# Patient Record
Sex: Female | Born: 1950 | Race: Black or African American | Hispanic: No | Marital: Married | Smoking: Never smoker
Health system: Southern US, Community
[De-identification: ages and names within clinical notes are randomized; demographics above are authoritative.]

## PROBLEM LIST (undated history)

## (undated) DIAGNOSIS — E042 Nontoxic multinodular goiter: Secondary | ICD-10-CM

## (undated) DIAGNOSIS — K5792 Diverticulitis of intestine, part unspecified, without perforation or abscess without bleeding: Secondary | ICD-10-CM

## (undated) DIAGNOSIS — I10 Essential (primary) hypertension: Secondary | ICD-10-CM

## (undated) DIAGNOSIS — K56609 Unspecified intestinal obstruction, unspecified as to partial versus complete obstruction: Secondary | ICD-10-CM

## (undated) DIAGNOSIS — E785 Hyperlipidemia, unspecified: Secondary | ICD-10-CM

## (undated) DIAGNOSIS — D649 Anemia, unspecified: Secondary | ICD-10-CM

## (undated) DIAGNOSIS — M1990 Unspecified osteoarthritis, unspecified site: Secondary | ICD-10-CM

## (undated) DIAGNOSIS — E119 Type 2 diabetes mellitus without complications: Secondary | ICD-10-CM

## (undated) DIAGNOSIS — R413 Other amnesia: Secondary | ICD-10-CM

## (undated) DIAGNOSIS — E041 Nontoxic single thyroid nodule: Secondary | ICD-10-CM

## (undated) HISTORY — DX: Other amnesia: R41.3

## (undated) HISTORY — PX: SMALL INTESTINE SURGERY: SHX150

## (undated) HISTORY — DX: Unspecified intestinal obstruction, unspecified as to partial versus complete obstruction: K56.609

## (undated) HISTORY — DX: Hyperlipidemia, unspecified: E78.5

## (undated) HISTORY — PX: ABDOMINAL HYSTERECTOMY: SHX81

## (undated) HISTORY — PX: HEMICOLECTOMY: SHX854

## (undated) HISTORY — PX: CHOLECYSTECTOMY: SHX55

## (undated) HISTORY — PX: TONSILLECTOMY: SUR1361

## (undated) HISTORY — PX: LAPAROSCOPIC REMOVAL ABDOMINAL MASS: SHX6360

## (undated) HISTORY — PX: LUMBAR DISC SURGERY: SHX700

## (undated) HISTORY — PX: EYE SURGERY: SHX253

## (undated) HISTORY — PX: HAND SURGERY: SHX662

## (undated) HISTORY — PX: FOOT SURGERY: SHX648

---
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
## 1993-12-17 DIAGNOSIS — I219 Acute myocardial infarction, unspecified: Secondary | ICD-10-CM

## 1993-12-17 HISTORY — DX: Acute myocardial infarction, unspecified: I21.9

## 1998-12-17 HISTORY — PX: CARDIAC CATHETERIZATION: SHX172

## 1999-03-15 ENCOUNTER — Inpatient Hospital Stay (HOSPITAL_COMMUNITY): Admission: EM | Admit: 1999-03-15 | Discharge: 1999-03-17 | Payer: Self-pay | Admitting: Emergency Medicine

## 1999-03-15 ENCOUNTER — Encounter: Payer: Self-pay | Admitting: Emergency Medicine

## 2001-08-06 ENCOUNTER — Encounter: Payer: Self-pay | Admitting: Neurosurgery

## 2001-08-11 ENCOUNTER — Ambulatory Visit (HOSPITAL_COMMUNITY): Admission: RE | Admit: 2001-08-11 | Discharge: 2001-08-12 | Payer: Self-pay | Admitting: Neurosurgery

## 2001-08-11 ENCOUNTER — Encounter: Payer: Self-pay | Admitting: Neurosurgery

## 2002-02-13 ENCOUNTER — Encounter: Payer: Self-pay | Admitting: Neurosurgery

## 2002-02-17 ENCOUNTER — Encounter: Payer: Self-pay | Admitting: Neurosurgery

## 2002-02-17 ENCOUNTER — Inpatient Hospital Stay (HOSPITAL_COMMUNITY): Admission: RE | Admit: 2002-02-17 | Discharge: 2002-03-02 | Payer: Self-pay | Admitting: Neurosurgery

## 2002-03-20 ENCOUNTER — Emergency Department (HOSPITAL_COMMUNITY): Admission: EM | Admit: 2002-03-20 | Discharge: 2002-03-20 | Payer: Self-pay | Admitting: Emergency Medicine

## 2004-03-07 ENCOUNTER — Encounter: Admission: RE | Admit: 2004-03-07 | Discharge: 2004-03-07 | Payer: Self-pay | Admitting: Obstetrics and Gynecology

## 2004-03-23 ENCOUNTER — Encounter: Admission: RE | Admit: 2004-03-23 | Discharge: 2004-03-28 | Payer: Self-pay | Admitting: Neurology

## 2004-03-23 ENCOUNTER — Encounter (INDEPENDENT_AMBULATORY_CARE_PROVIDER_SITE_OTHER): Payer: Self-pay | Admitting: Cardiology

## 2004-03-23 ENCOUNTER — Inpatient Hospital Stay (HOSPITAL_COMMUNITY): Admission: EM | Admit: 2004-03-23 | Discharge: 2004-03-28 | Payer: Self-pay | Admitting: Emergency Medicine

## 2004-03-28 ENCOUNTER — Inpatient Hospital Stay (HOSPITAL_COMMUNITY)
Admission: RE | Admit: 2004-03-28 | Discharge: 2004-03-30 | Payer: Self-pay | Admitting: Physical Medicine & Rehabilitation

## 2005-03-07 ENCOUNTER — Emergency Department: Payer: Self-pay | Admitting: Emergency Medicine

## 2005-04-07 ENCOUNTER — Emergency Department: Payer: Self-pay | Admitting: Unknown Physician Specialty

## 2005-04-16 ENCOUNTER — Ambulatory Visit: Payer: Self-pay | Admitting: Family Medicine

## 2005-05-14 ENCOUNTER — Encounter: Payer: Self-pay | Admitting: Orthopedic Surgery

## 2005-05-17 ENCOUNTER — Encounter: Payer: Self-pay | Admitting: Orthopedic Surgery

## 2005-06-16 ENCOUNTER — Encounter: Payer: Self-pay | Admitting: Orthopedic Surgery

## 2005-11-14 ENCOUNTER — Other Ambulatory Visit: Payer: Self-pay

## 2005-11-14 ENCOUNTER — Observation Stay: Payer: Self-pay

## 2006-03-29 IMAGING — CT CT HEAD W/O CM
1 of 2 series · 13 of 30 positions shown, 17 images · non-contrast
Comparison: 03/23/04.

CLINICAL DATA: Stroke.  
 COMPUTERIZED CRANIAL TOMOGRAPHY PERFORMED WITHOUT CONTRAST 03/24/04

[Series 2: brain · axial · 0.47mm/px · z∈[-17,+91]mm · 13 of 32 slices shown, 17 images]
[im 3/32  brain]
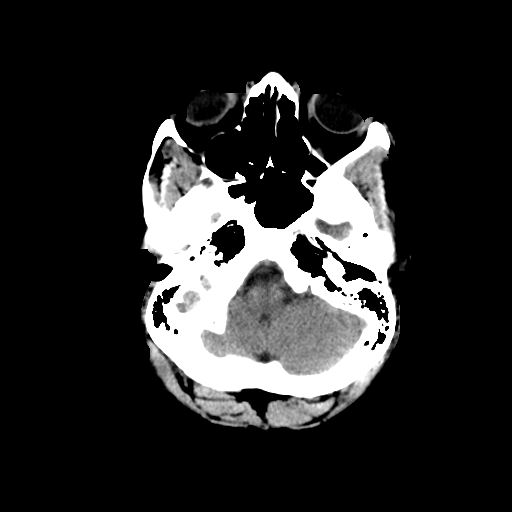
[im 3/32  bone]
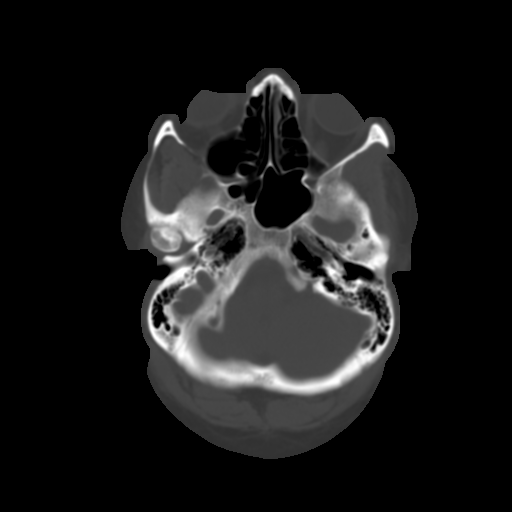
[im 5/32  brain]
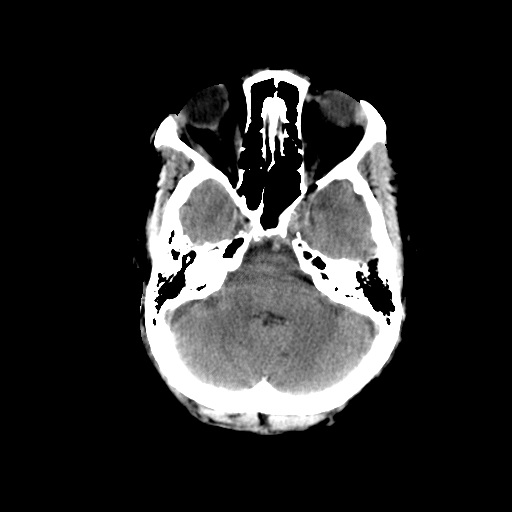
[im 7/32  brain]
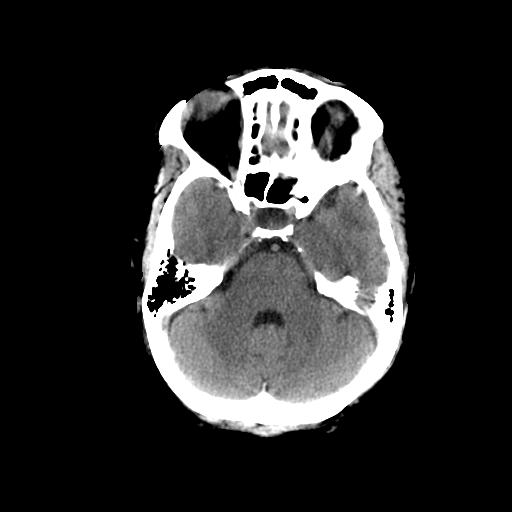
[im 9/32  brain]
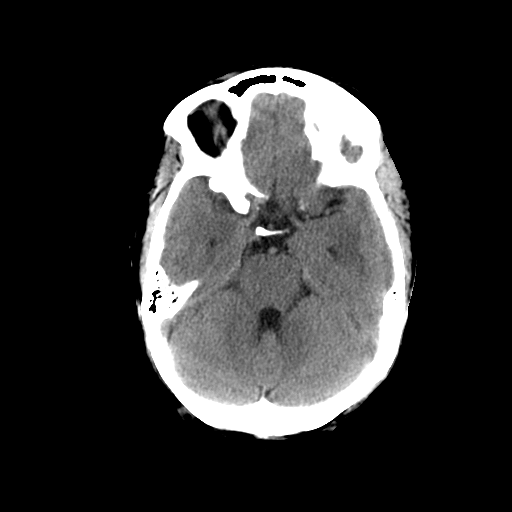
[im 12/32  brain]
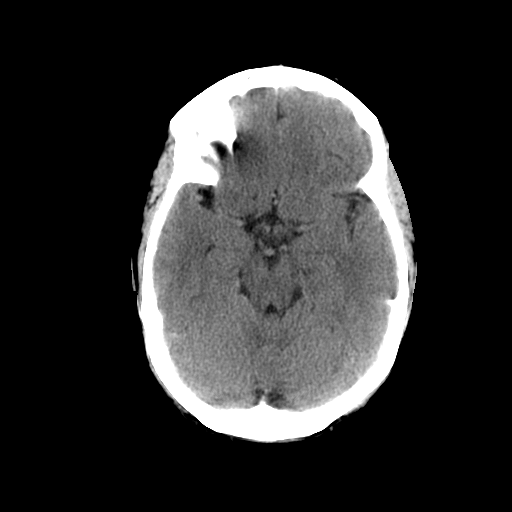
[im 12/32  bone]
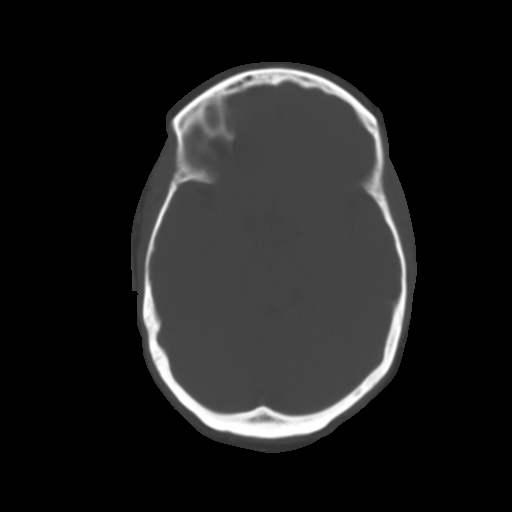
[im 14/32  brain]
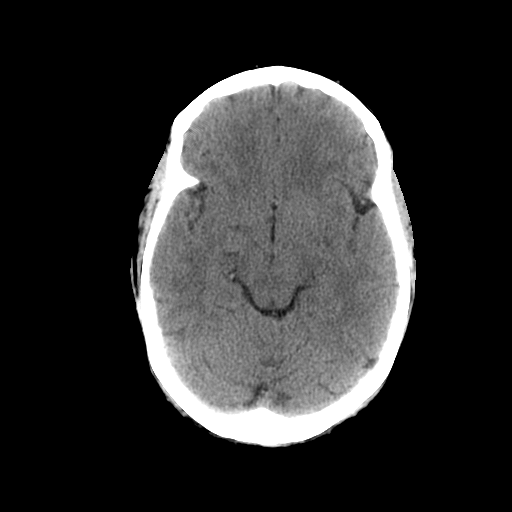
[im 16/32  brain]
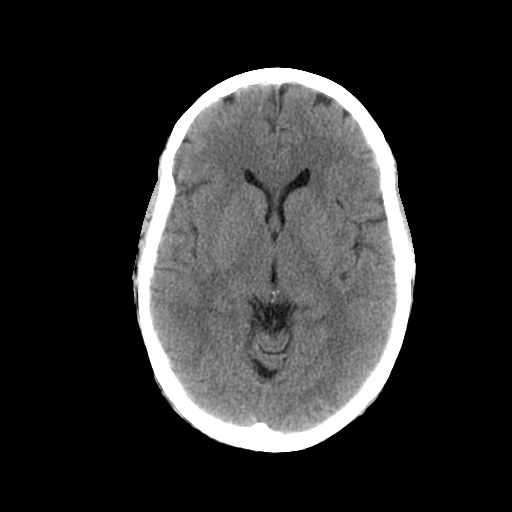
[im 18/32  brain]
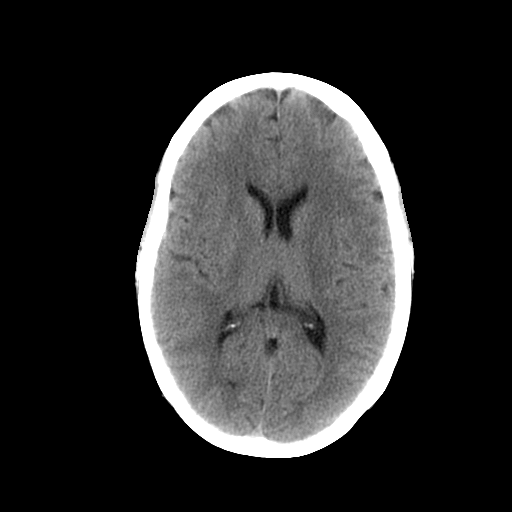
[im 20/32  brain]
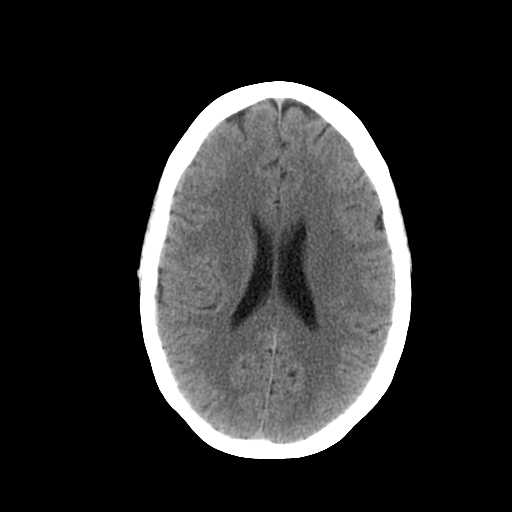
[im 20/32  bone]
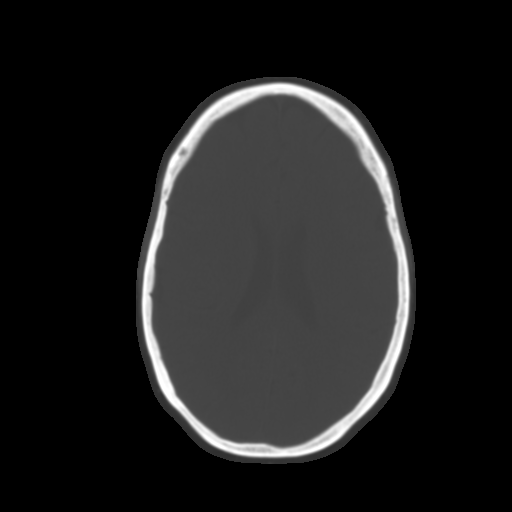
[im 23/32  brain]
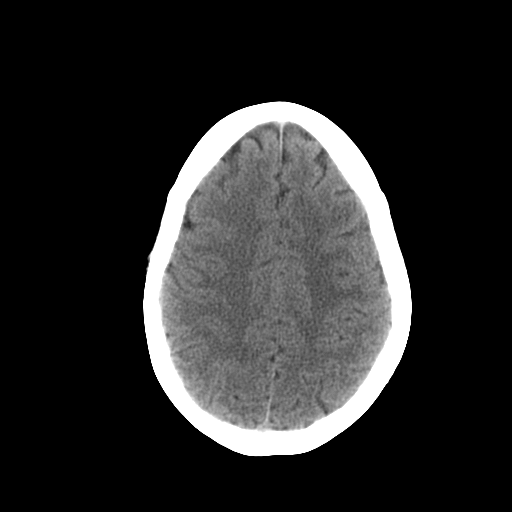
[im 25/32  brain]
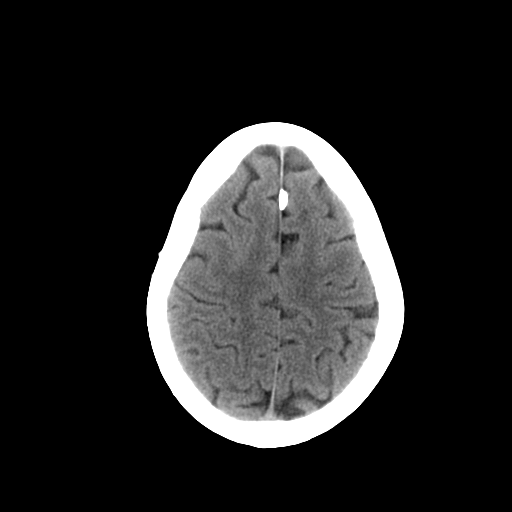
[im 27/32  brain]
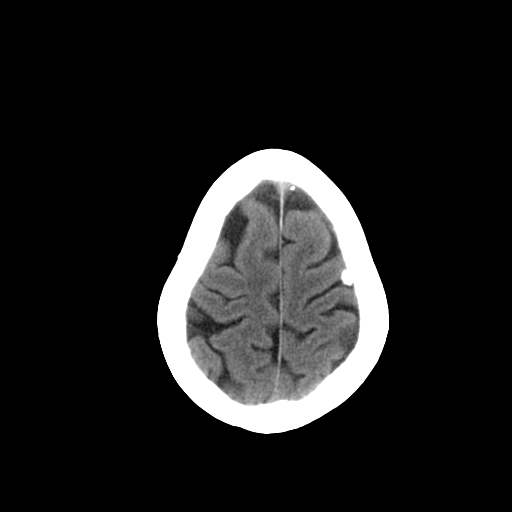
[im 29/32  brain]
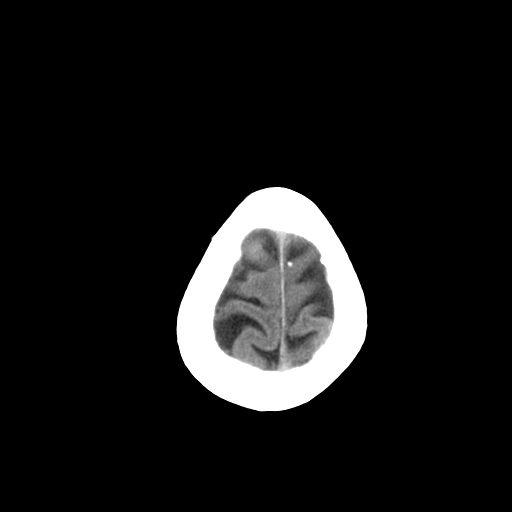
[im 29/32  bone]
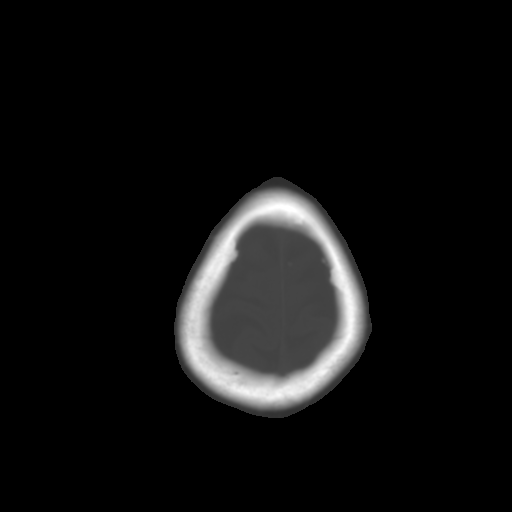

[13 of 30 positions shown; findings below may reference images not displayed]

There is no hemorrhage, infarction, mass lesion, or other significant abnormality.  There is a tiny amount of debris or mucosal thickening in the posterior aspect of the right maxillary sinus, unchanged.  
 IMPRESSION
 No significant abnormality.

## 2006-03-30 IMAGING — CT CT HEAD W/O CM
1 of 2 series · 13 of 30 positions shown, 17 images · non-contrast
Comparison: none

CLINICAL DATA: CVA. 
 CT OF THE HEAD WITHOUT CONTRAST 03/25/04
 Comparing 03/24/04.

[Series 2: brain · axial · 0.47mm/px · z∈[+125,+246]mm · 13 of 28 slices shown, 17 images]
[im 2/28  brain]
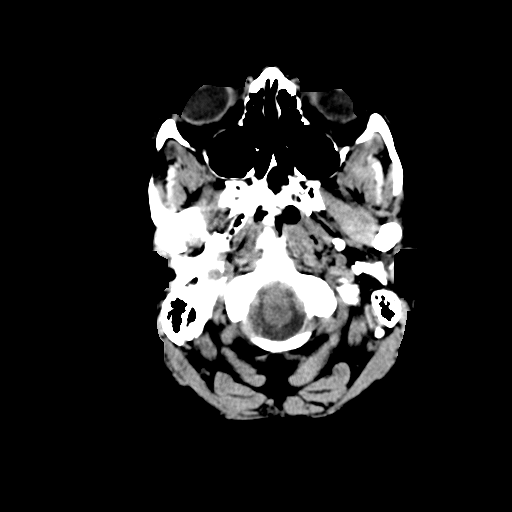
[im 2/28  bone]
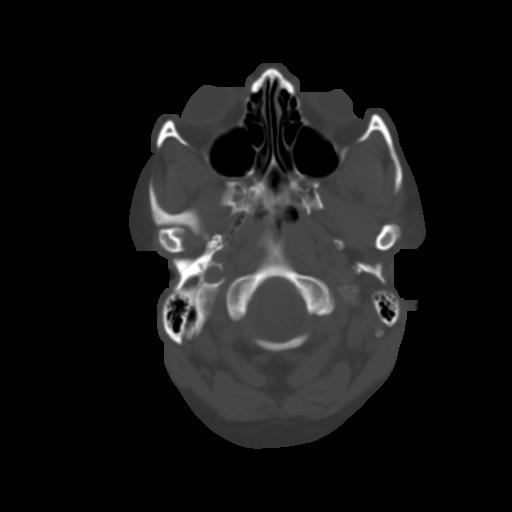
[im 4/28  brain]
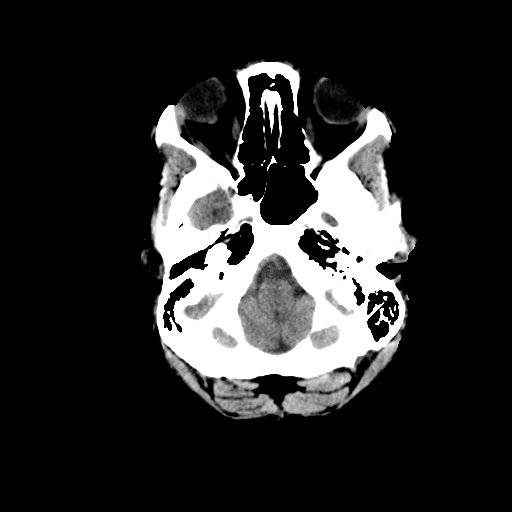
[im 6/28  brain]
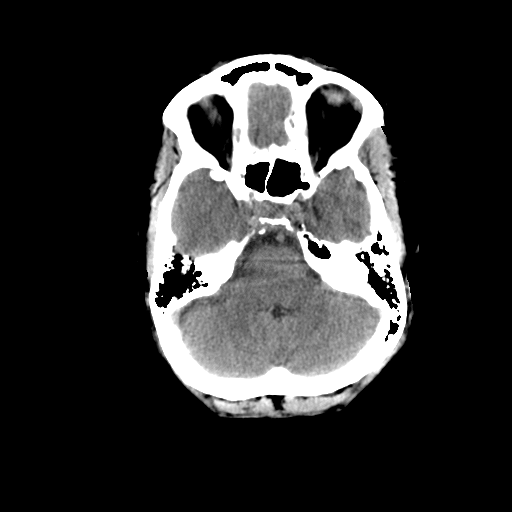
[im 8/28  brain]
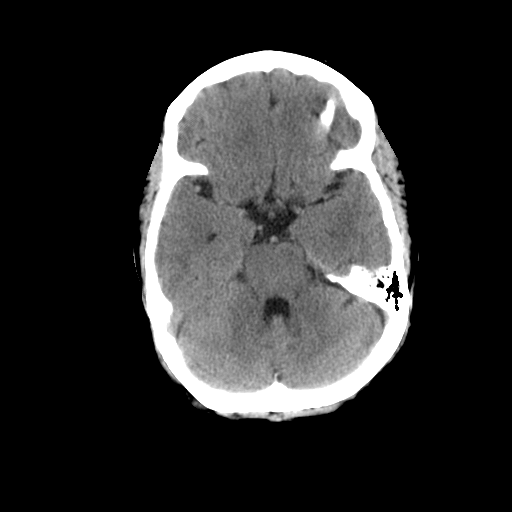
[im 10/28  brain]
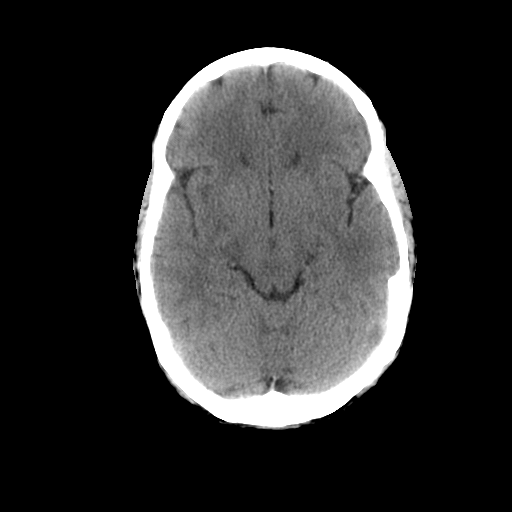
[im 10/28  bone]
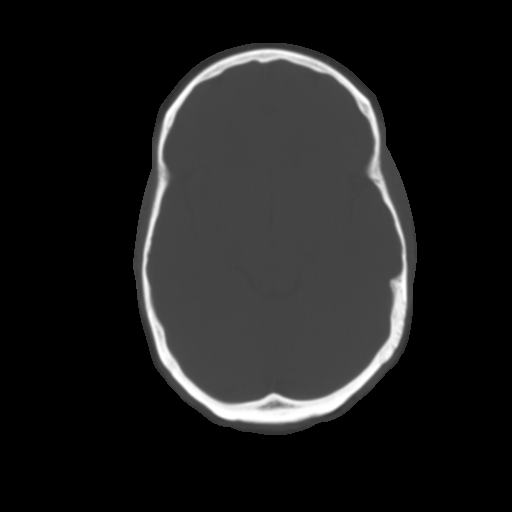
[im 12/28  brain]
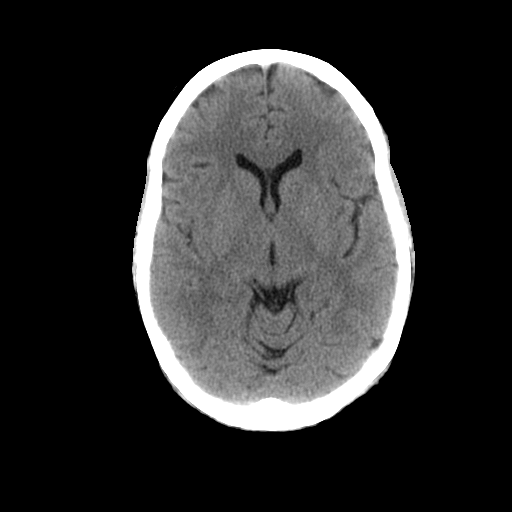
[im 14/28  brain]
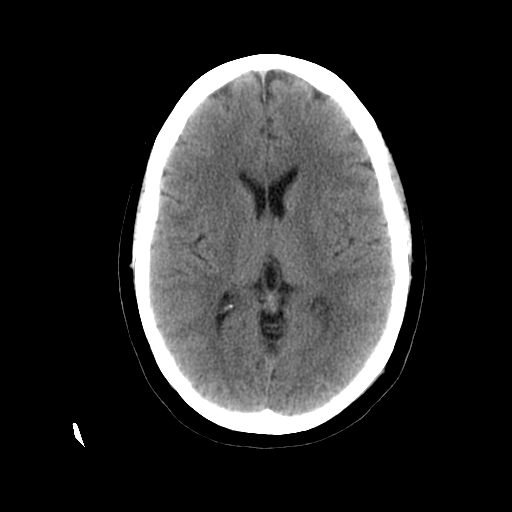
[im 16/28  brain]
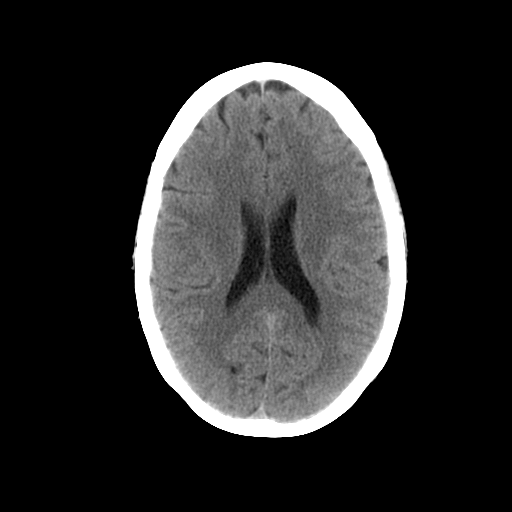
[im 18/28  brain]
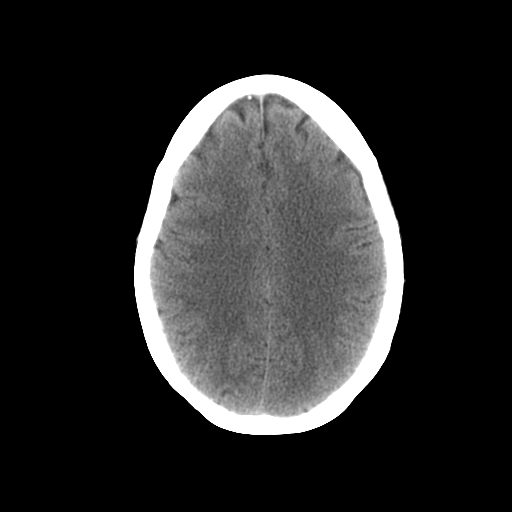
[im 18/28  bone]
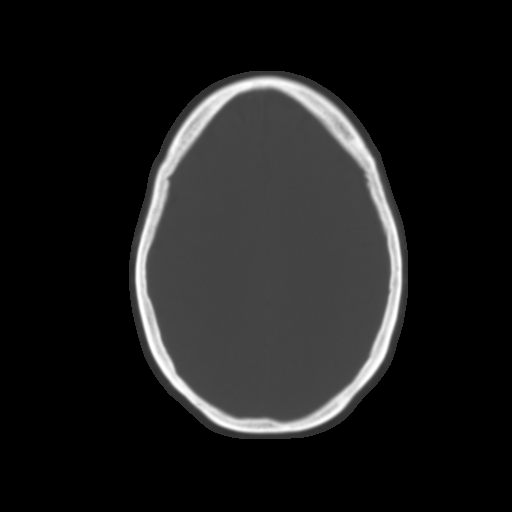
[im 20/28  brain]
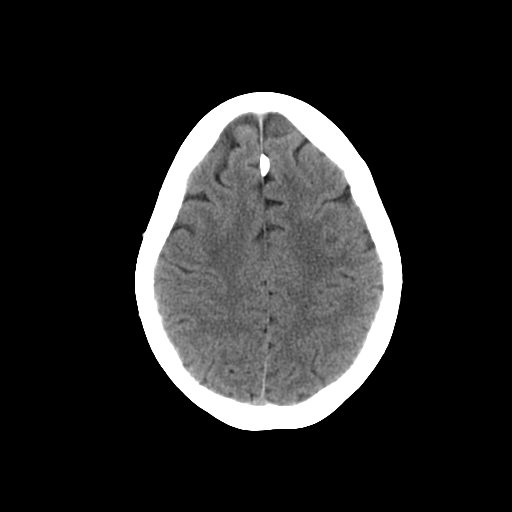
[im 22/28  brain]
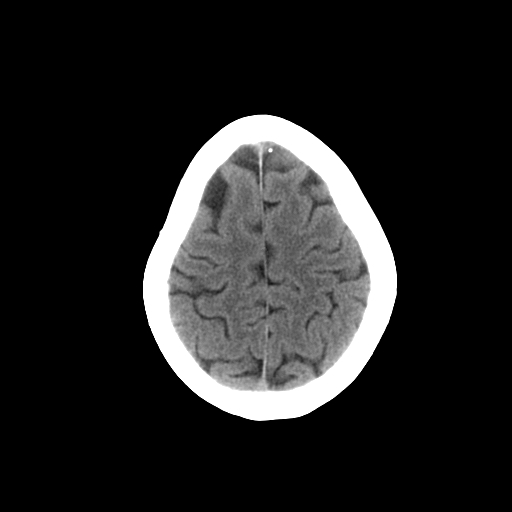
[im 24/28  brain]
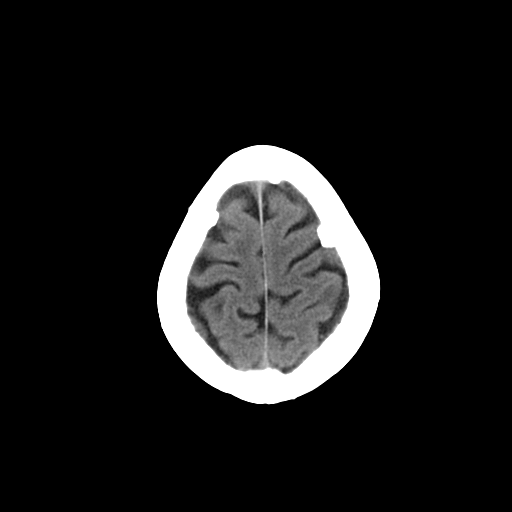
[im 26/28  brain]
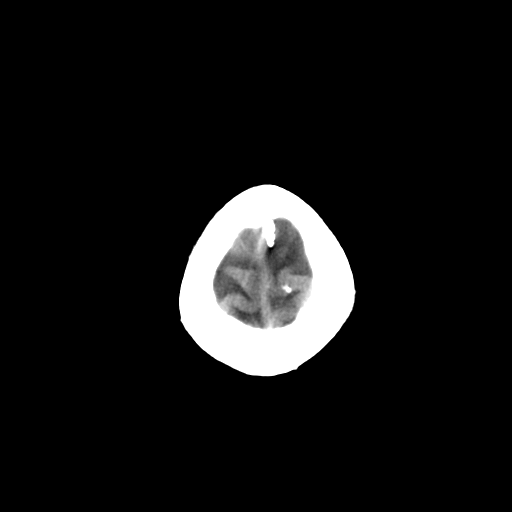
[im 26/28  bone]
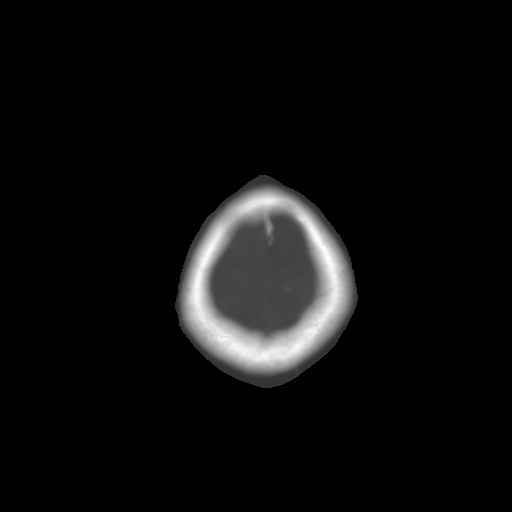

[13 of 30 positions shown; findings below may reference images not displayed]

FINDINGS: There is a very faint hypodensity along the inferior aspect of the left lentiform nucleus.  This is likely incidental but could represent a tiny remote lacune.  No intracranial hemorrhage or acute intracranial findings.  There is some frothy fluid in the right maxillary sinus suggesting right maxillary sinusitis.  
 IMPRESSION
 Right maxillary sinusitis. 
 Possible tiny lacune along the inferior aspect of the left lentiform nucleus.  This is not thought to represent an acute finding.  
 Consider MRI if there is a high degree of suspicion of occult intracranial abnormality.

## 2006-03-31 IMAGING — MR MR HEAD WO/W CM
11 of 18 series · 25 of 48 positions shown · IV contrast (20CC  OMNISCAN)
Comparison: none

CLINICAL DATA: Left-sided weakness.  
MR OF THE BRAIN WITH AND WITHOUT CONTRAST
20 cc of Omniscan were utilized.  No comparison MR scans. 
No acute infarct or abnormal intracranial enhancing lesion.  Minimal number of punctate nonspecific white matter type changes best seen on FLAIR imaging.  Considerations include changes related to underlying:  small vessel disease, vasculitis, inflammatory process, demyelinating process, or changes as have been described in patients who experience migraine headaches.  Question mild degenerative changes C3-4 and C4-5 level incompletely evaluated on present exam.  Cervicomedullary junction, pituitary region and pineal region unremarkable.  Orbital structures within normal limits.  Minimal partial opacification right maxillary sinus and right sphenoid sinus otherwise visualized sinuses and mastoid air cells clear.  
IMPRESSION
No acute infarct or abnormal intracranial enhancing lesion. 
Minimal nonspecific white matter type changes.  
Minimal paranasal sinus disease. 
MR CIRCLE OF WILLIS
Slight decreased visualization of right middle cerebral artery branches as versus left.  No significant stenosis along the posterior circulation. No aneurysm identified.  An aneurysm 5 mm or smaller may not be detected by the present technique.  If it were necessary to exclude an aneurysm of this size, rule out an underlying vasculitis or evaluate for the possibility of mild intracranial atherosclerotic type changes, formal catheter angiogram will be necessary. 
Decreased visualization right middle cerebral artery branch vessels as versus left.  
CONTRAST ENHANCED MR ANGIOGRAM EXTRACRANIAL CIRCULATION
20 cc of Omniscan were utilized. 
No evidence of significant stenosis or irregularity noted along the origin of the great vessels.  Right vertebral slightly dominant.  No evidence of hemodynamically significant stenosis noted along the carotid bifurcation on either side.  
No significant stenosis detected.

[Series 1: 3 plane loc · axial · 5.0mm · 0.94mm/px · 1 of 9 slices shown]
[im 1/9]
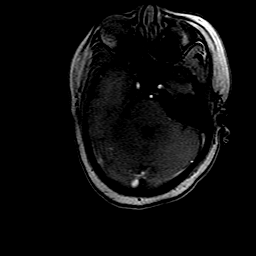

[Series 2: T1 · sagittal · 5.0mm · 0.43mm/px · 1 of 12 slices shown]
[im 1/12]
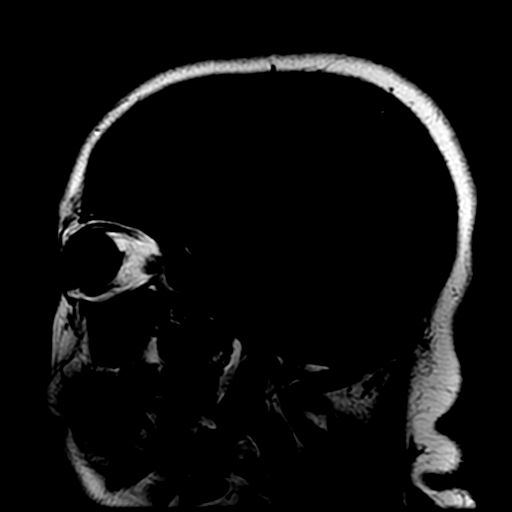

[Series 3: DWI · axial · 5.0mm · 1.25mm/px · 1 of 52 slices shown (1 of 4)]
[im 1/52]
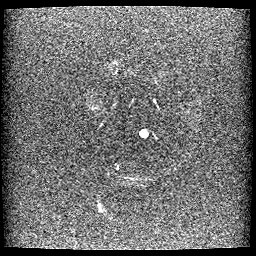

[Series 5: T2 · axial · 5.0mm · 0.43mm/px · 1 of 20 slices shown]
[im 1/20]
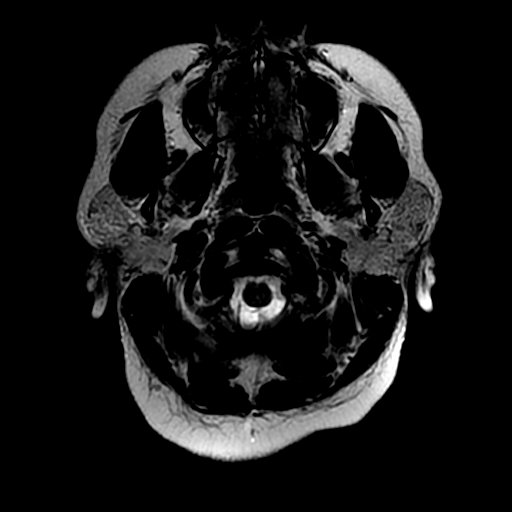

[Series 6: FLAIR · axial · 5.0mm · 0.86mm/px · 1 of 20 slices shown]
[im 1/20]
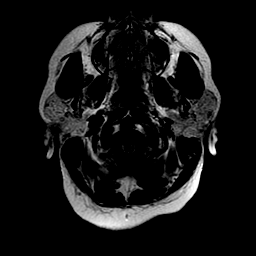

[Series 8: TOF · axial · 4.0mm · 1.09mm/px · z∈[-267,+8]mm · 3 of 109 slices shown]
[im 1/109]
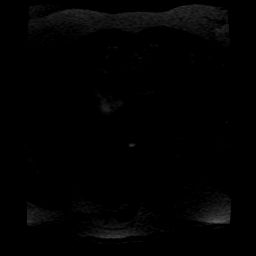
[im 55/109]
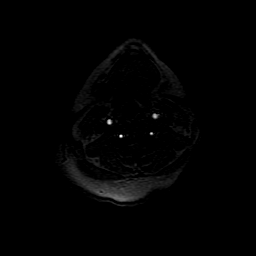
[im 109/109]
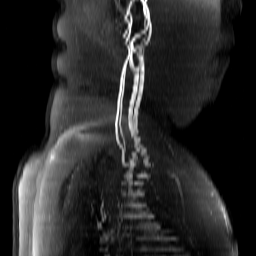

[Series 9: DWI · axial · 5.0mm · 1.25mm/px · 1 of 54 slices shown (2 of 4)]
[im 1/54]
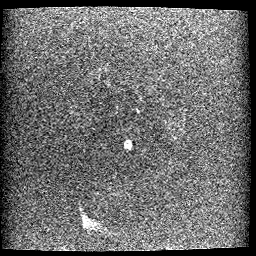

[Series 10: mask · coronal · 1.4mm · 0.55mm/px · 7 of 226 slices shown]
[im 1/226]
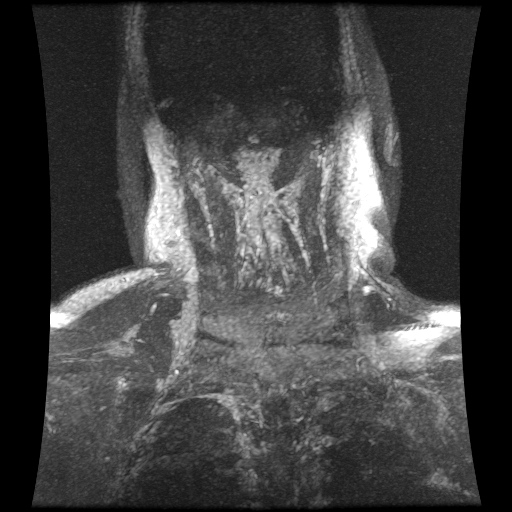
[im 38/226]
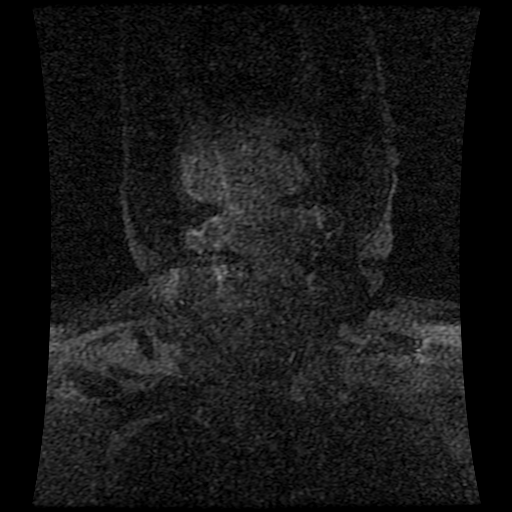
[im 76/226]
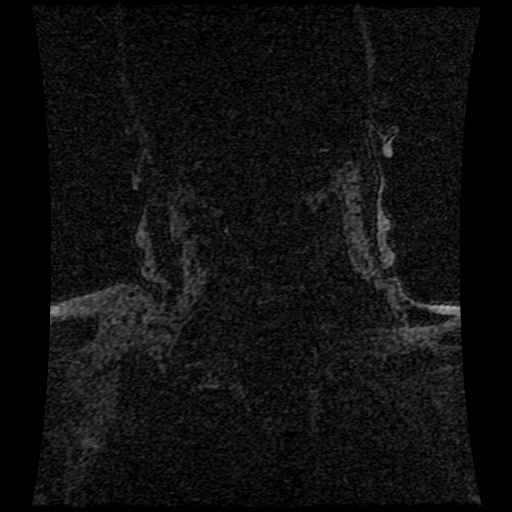
[im 113/226]
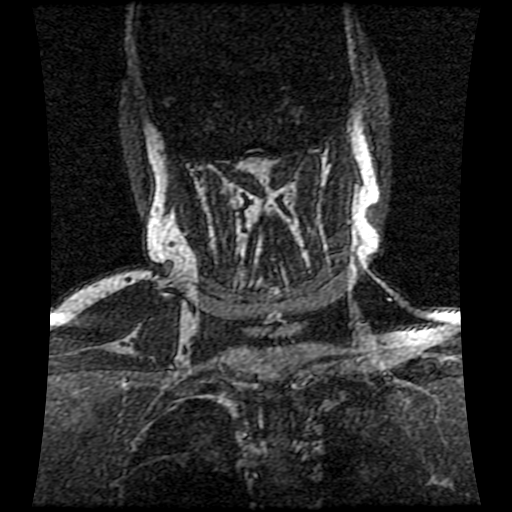
[im 151/226]
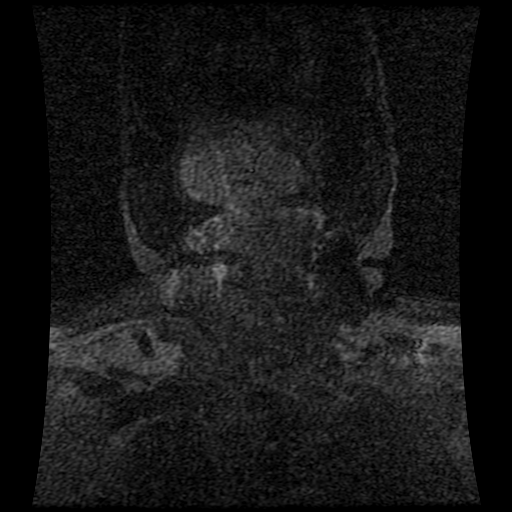
[im 188/226]
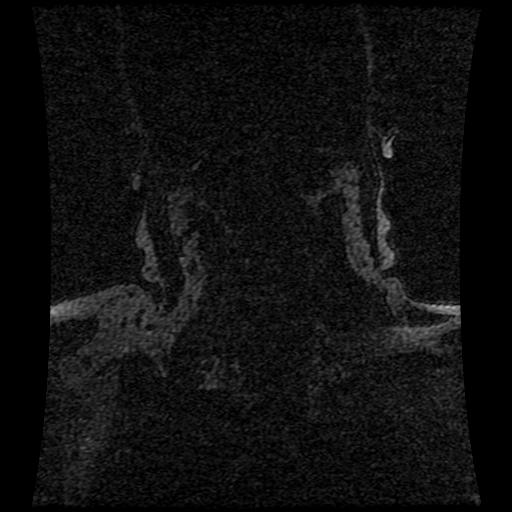
[im 226/226]
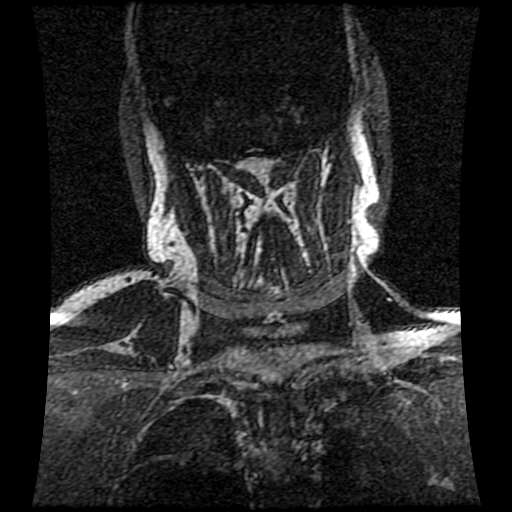

[Series 11: fl tr cor · coronal · 1.4mm · 0.55mm/px · 7 of 224 slices shown]
[im 1/224]
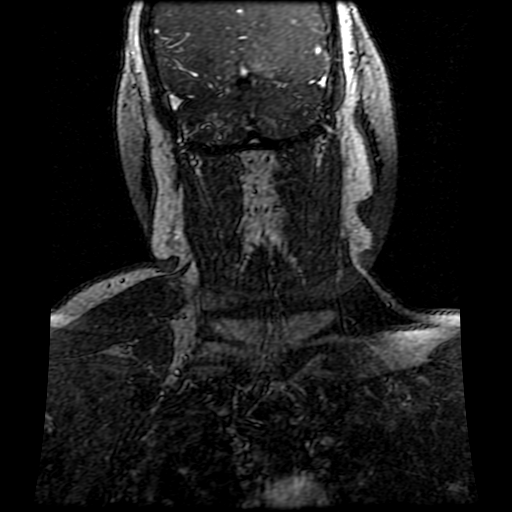
[im 38/224]
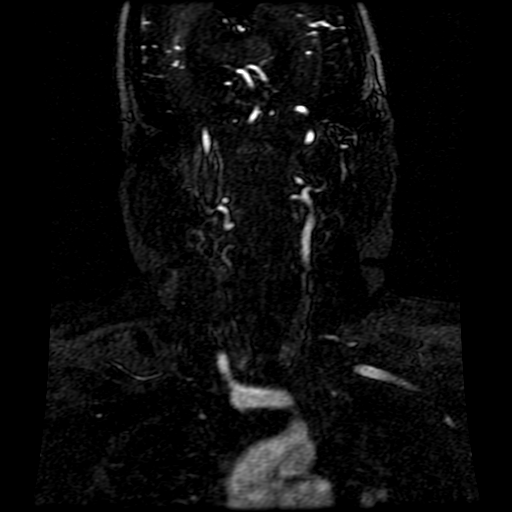
[im 75/224]
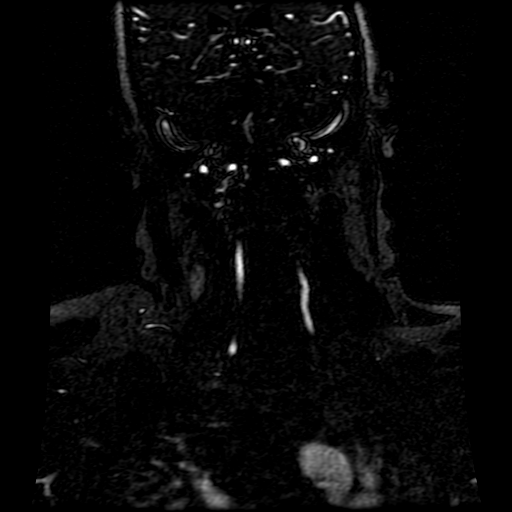
[im 112/224]
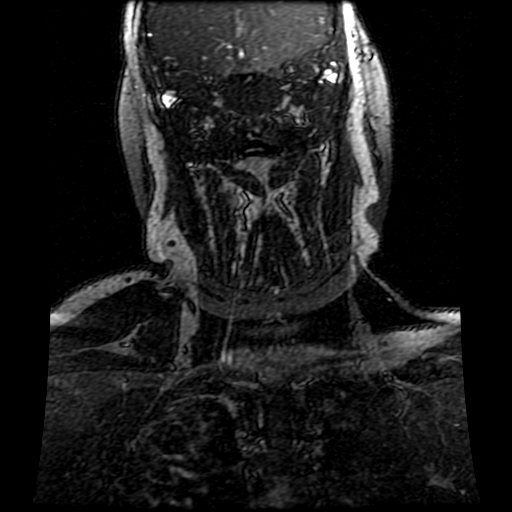
[im 149/224]
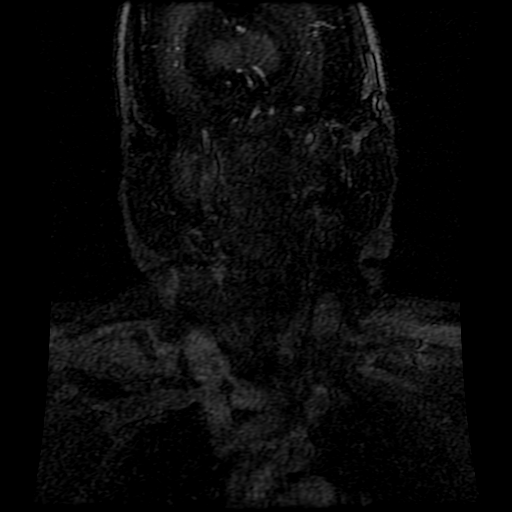
[im 186/224]
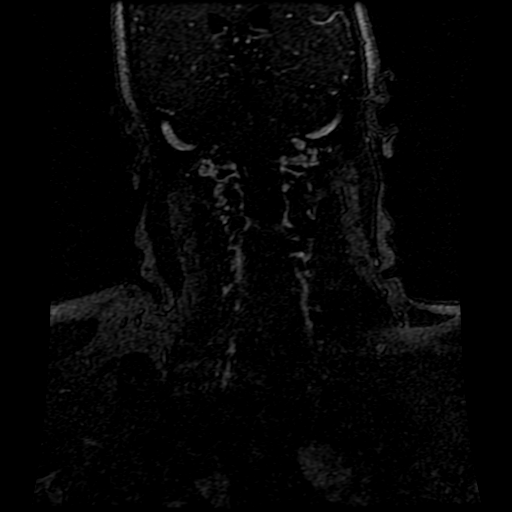
[im 224/224]
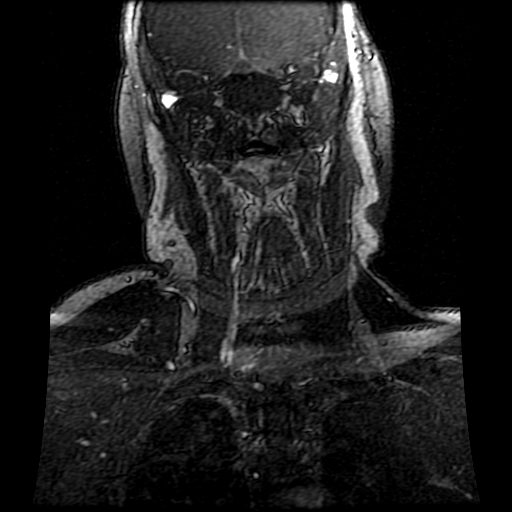

[Series 300: DWI · axial · 5.0mm · 1.25mm/px · 1 of 26 slices shown (3 of 4)]
[im 1/26]
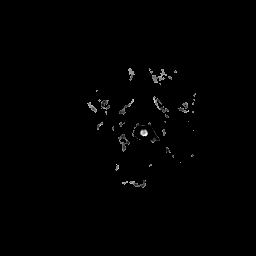

[Series 301: DWI · axial · 5.0mm · 1.25mm/px · 1 of 26 slices shown (4 of 4)]
[im 1/26]
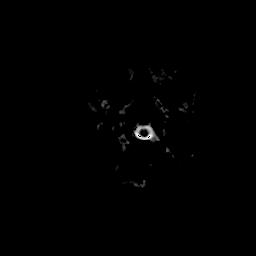

[25 of 48 positions shown; findings below may reference images not displayed]

## 2006-03-31 IMAGING — CT CT HEAD W/O CM
1 of 2 series · 13 of 30 positions shown, 17 images · non-contrast
Comparison: none

CLINICAL DATA: CVA.
 CT OF THE HEAD WITHOUT CONTRAST ? 03/26/04 
 Comparing 03/25/04.

[Series 2: brain · axial · 0.47mm/px · z∈[+115,+236]mm · 13 of 28 slices shown, 17 images]
[im 2/28  brain]
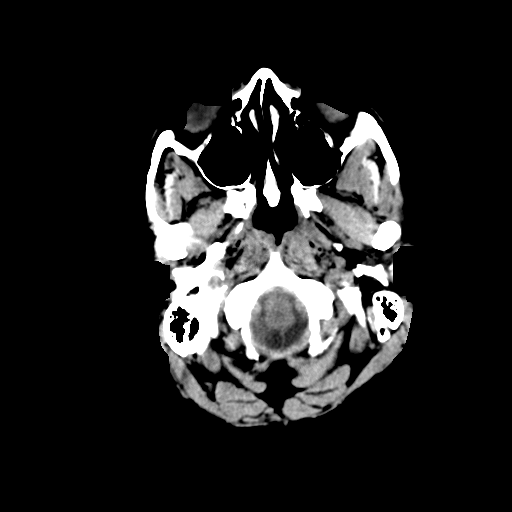
[im 2/28  bone]
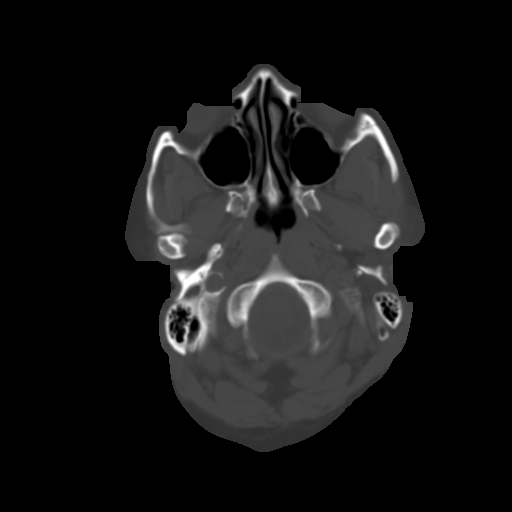
[im 4/28  brain]
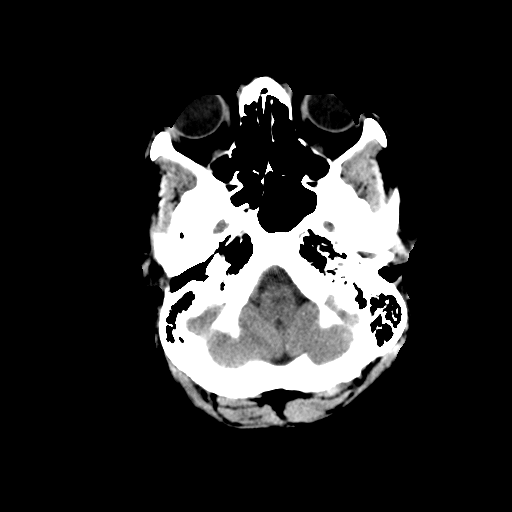
[im 6/28  brain]
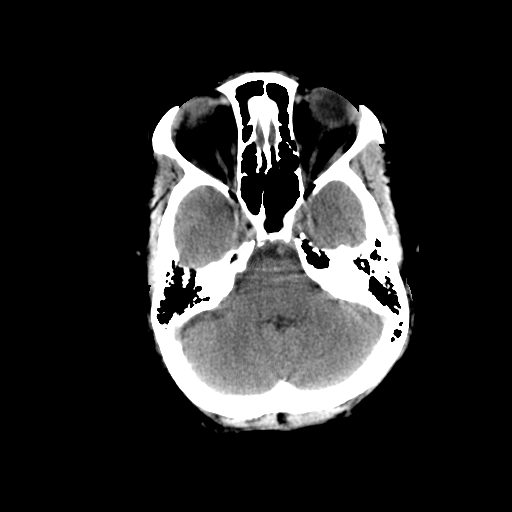
[im 8/28  brain]
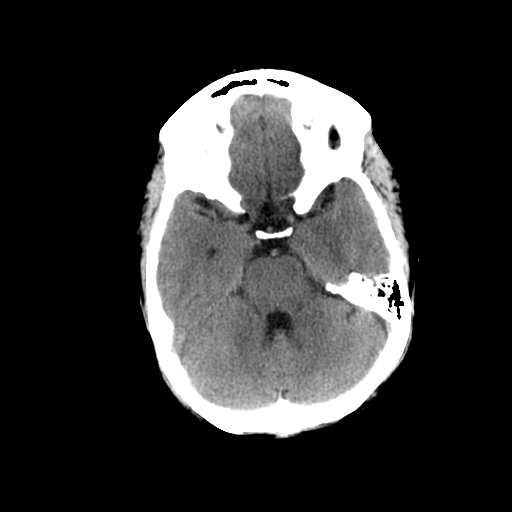
[im 10/28  brain]
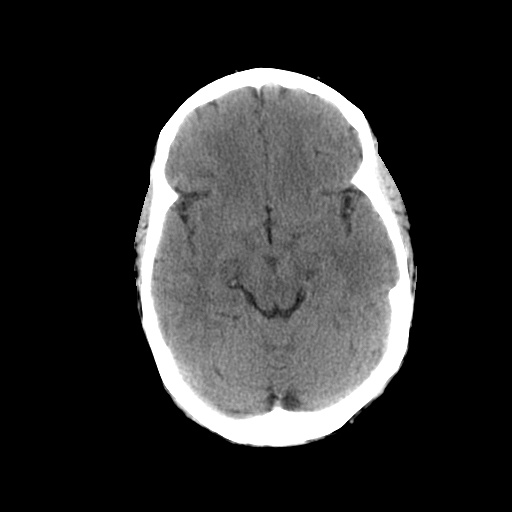
[im 10/28  bone]
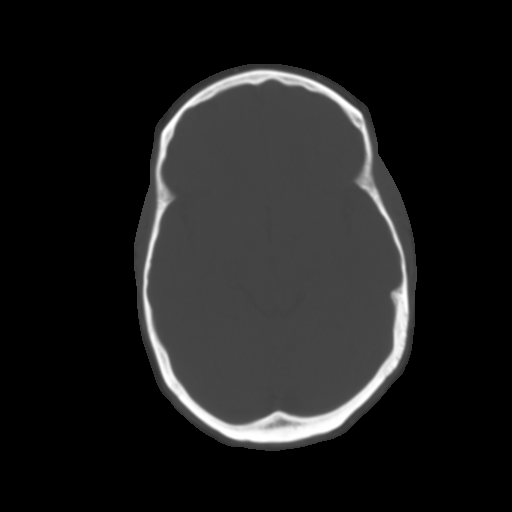
[im 12/28  brain]
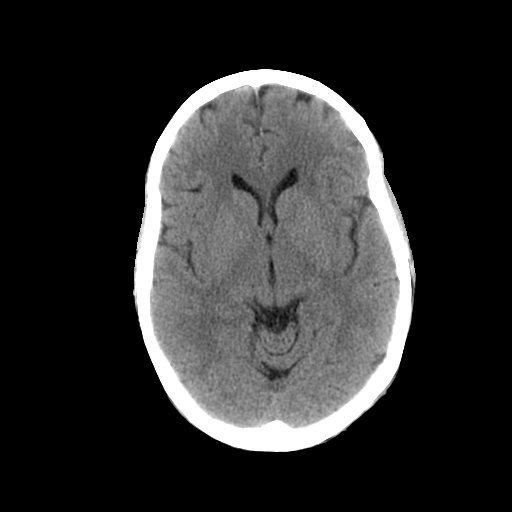
[im 14/28  brain]
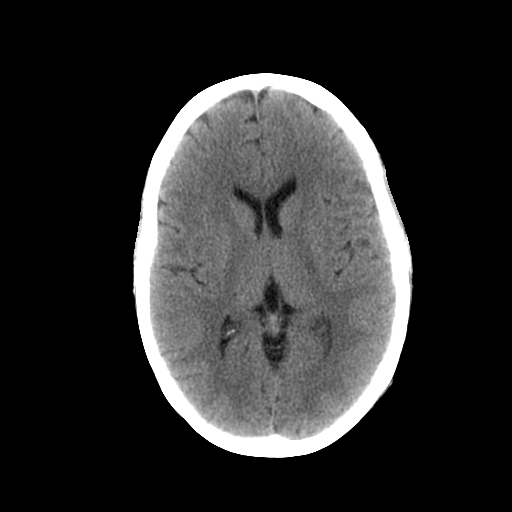
[im 16/28  brain]
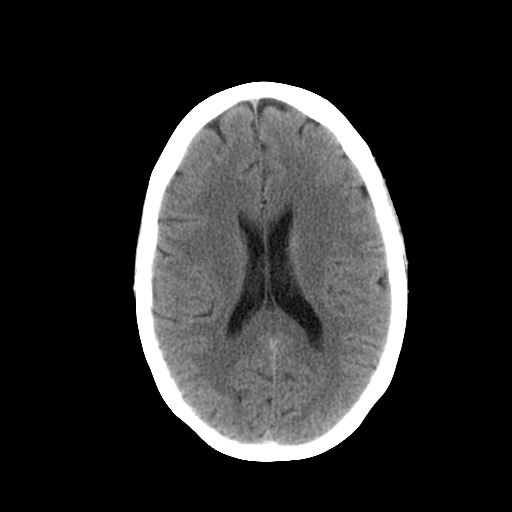
[im 18/28  brain]
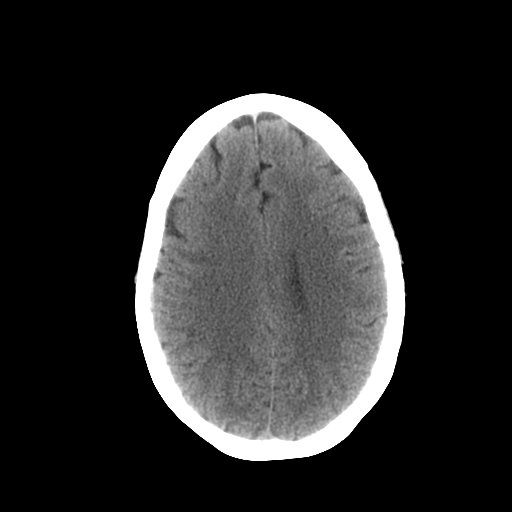
[im 18/28  bone]
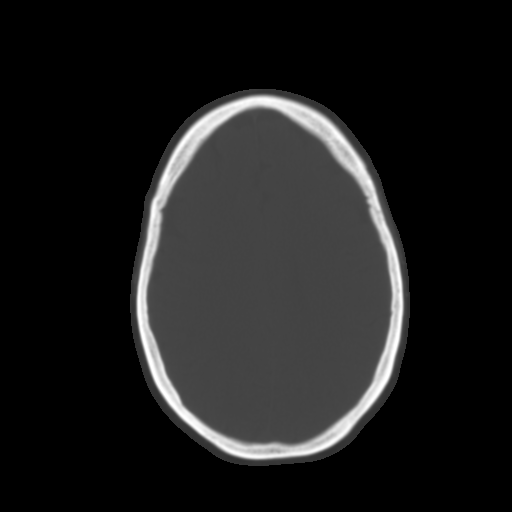
[im 20/28  brain]
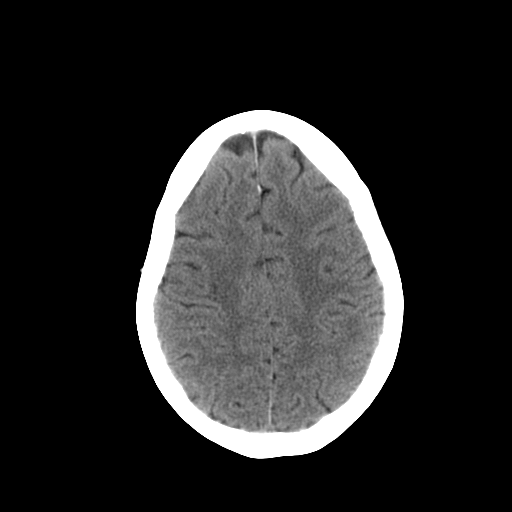
[im 22/28  brain]
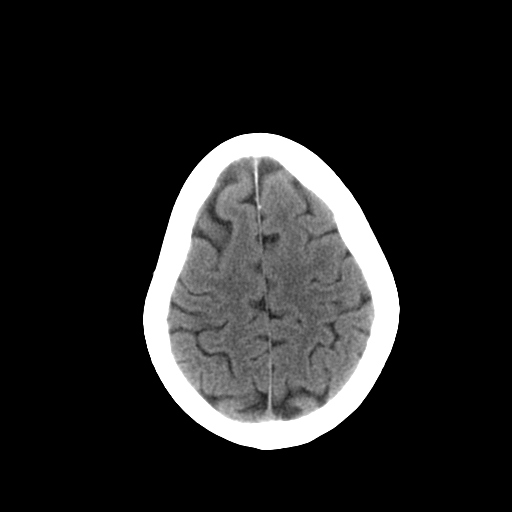
[im 24/28  brain]
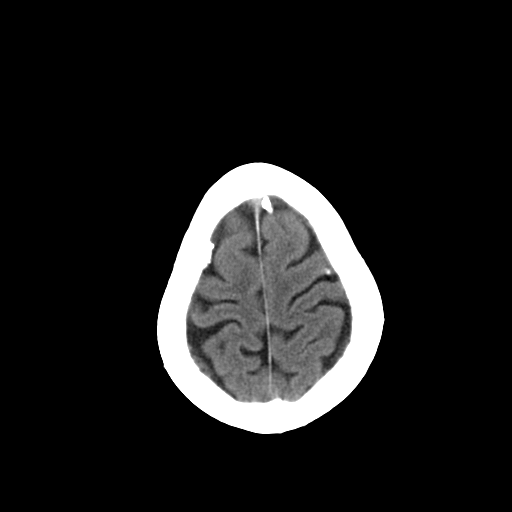
[im 26/28  brain]
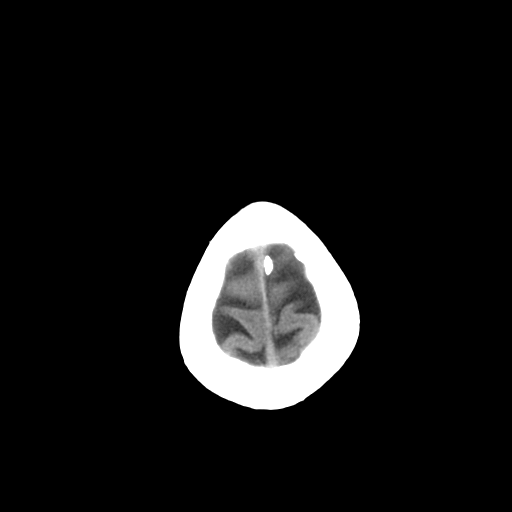
[im 26/28  bone]
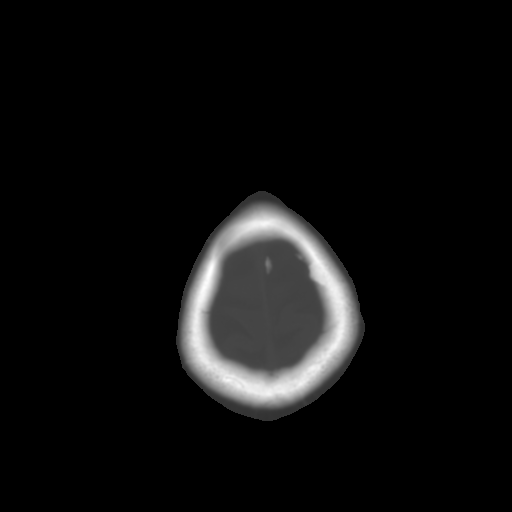

[13 of 30 positions shown; findings below may reference images not displayed]

FINDINGS: Again noted is right maxillary chronic sinusitis.  No acute intracranial findings.  No abnormal extraaxial fluid collection or hydrocephalus.
IMPRESSION: No acute intracranial findings.

## 2006-07-02 ENCOUNTER — Ambulatory Visit: Payer: Self-pay | Admitting: Unknown Physician Specialty

## 2006-07-27 ENCOUNTER — Emergency Department (HOSPITAL_COMMUNITY): Admission: EM | Admit: 2006-07-27 | Discharge: 2006-07-27 | Payer: Self-pay | Admitting: Emergency Medicine

## 2006-10-18 ENCOUNTER — Ambulatory Visit: Payer: Self-pay | Admitting: Family Medicine

## 2006-11-25 ENCOUNTER — Ambulatory Visit: Payer: Self-pay | Admitting: Internal Medicine

## 2006-12-27 ENCOUNTER — Emergency Department: Payer: Self-pay | Admitting: Emergency Medicine

## 2006-12-27 ENCOUNTER — Emergency Department (HOSPITAL_COMMUNITY): Admission: EM | Admit: 2006-12-27 | Discharge: 2006-12-27 | Payer: Self-pay | Admitting: Emergency Medicine

## 2007-01-18 ENCOUNTER — Emergency Department: Payer: Self-pay

## 2007-02-07 ENCOUNTER — Emergency Department (HOSPITAL_COMMUNITY): Admission: EM | Admit: 2007-02-07 | Discharge: 2007-02-08 | Payer: Self-pay | Admitting: Emergency Medicine

## 2007-03-12 IMAGING — US US EXTREM LOW VENOUS*L*
1 series · 17 of 24 positions shown · non-contrast
Comparison: none

REASON FOR EXAM: Left leg pain. rm 2
COMMENTS:

PROCEDURE:     US  - US DOPPLER LOW EXTR LEFT  - March 07, 2005 [DATE]
RESULT:        The phasic, augmentation and Valsalva flow waveforms are
normal.  The LEFT femoral and popliteal vein shows normal compressibility.
Doppler examination shows no occlusion or evidence of deep venous
thrombosis.

[Series 1: us extrem low venous*left* · 17 of 25 slices shown]
[im 1/25]
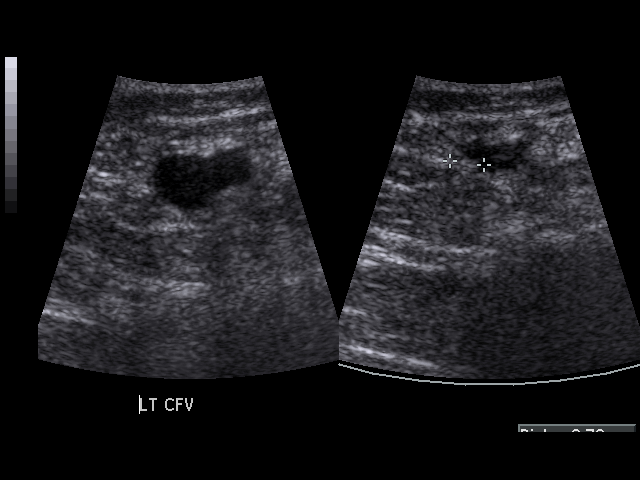
[im 3/25]
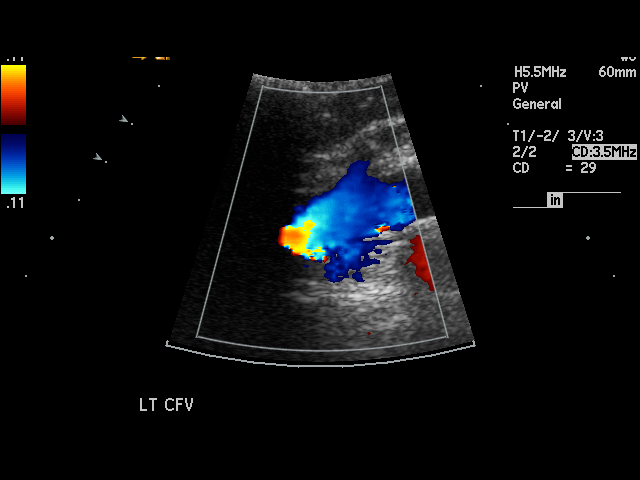
[im 4/25]
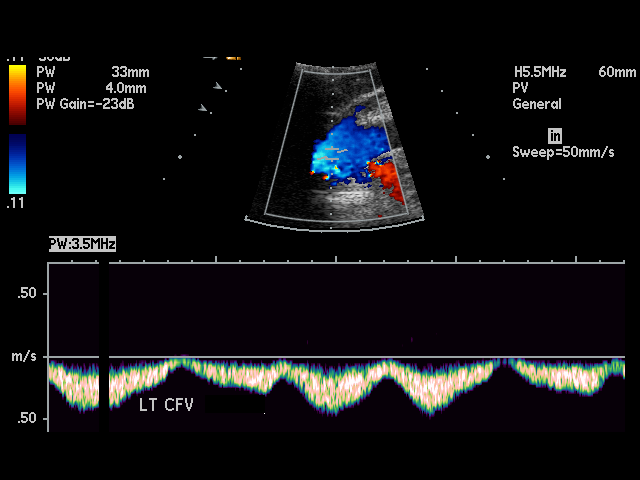
[im 5/25]
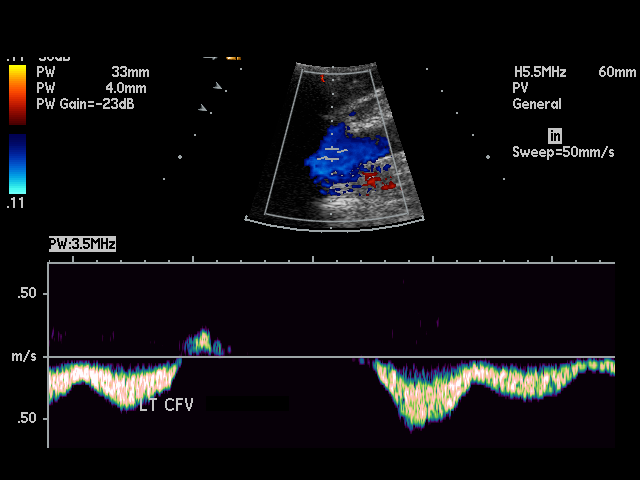
[im 7/25]
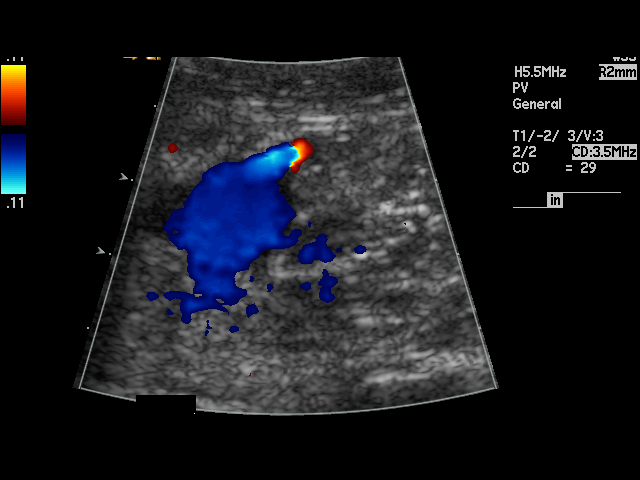
[im 8/25]
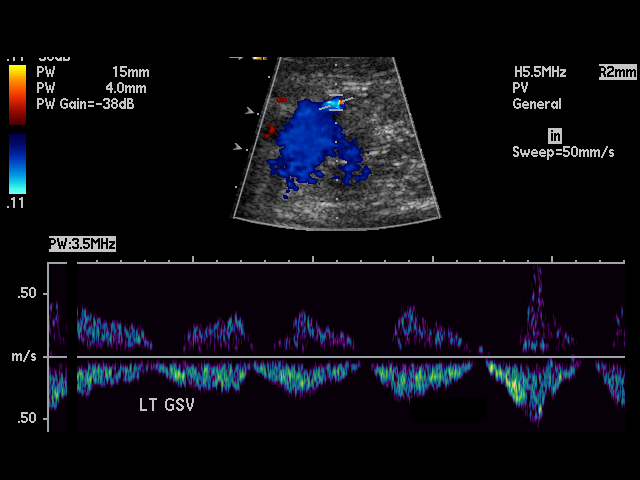
[im 10/25]
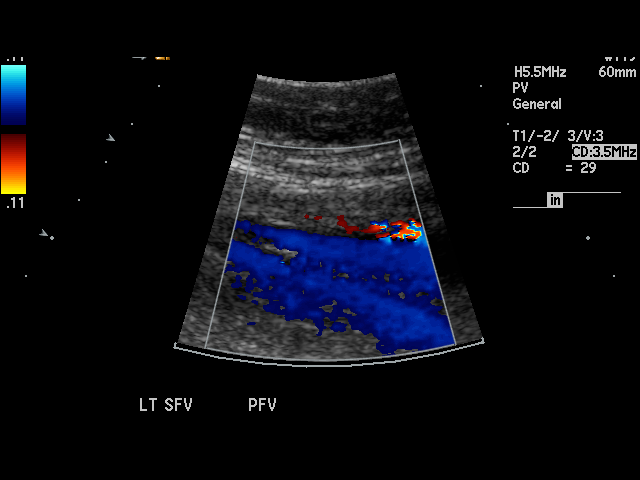
[im 11/25]
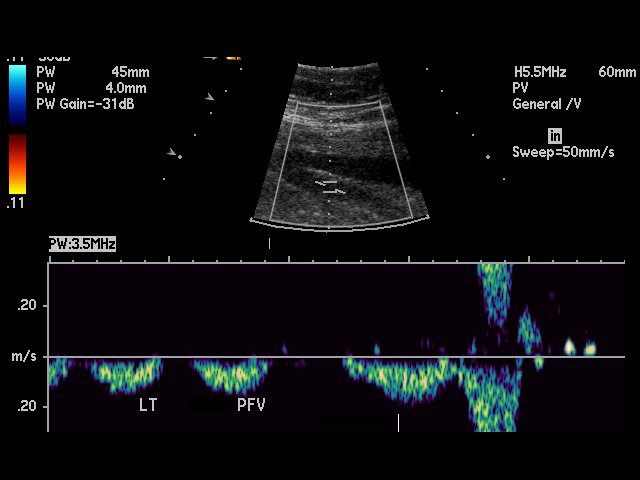
[im 13/25]
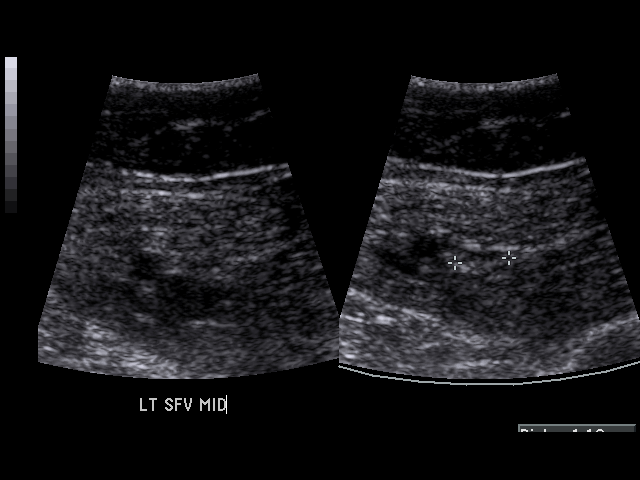
[im 14/25]
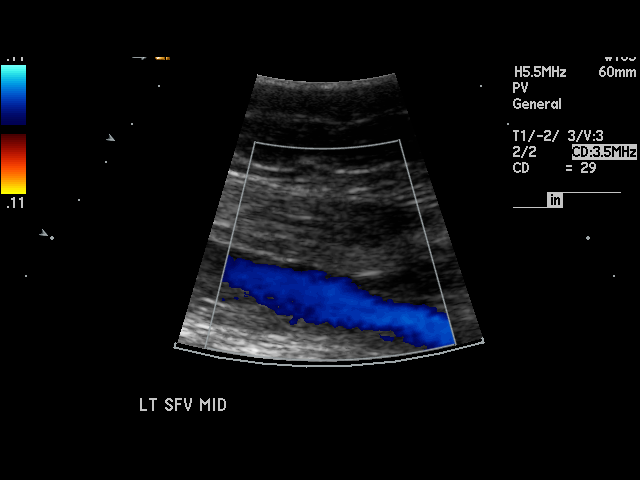
[im 15/25]
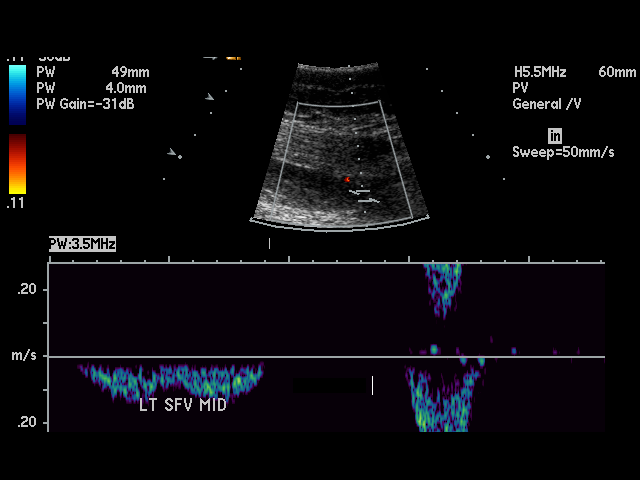
[im 17/25]
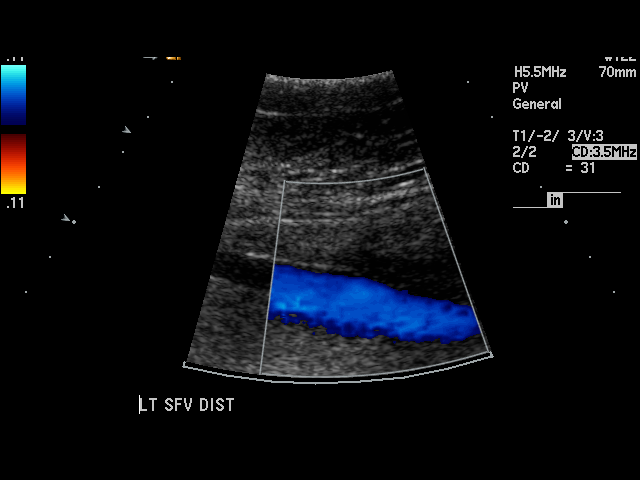
[im 18/25]
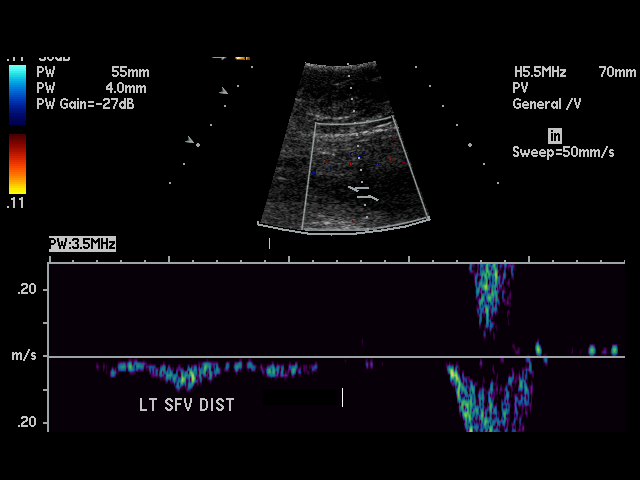
[im 20/25]
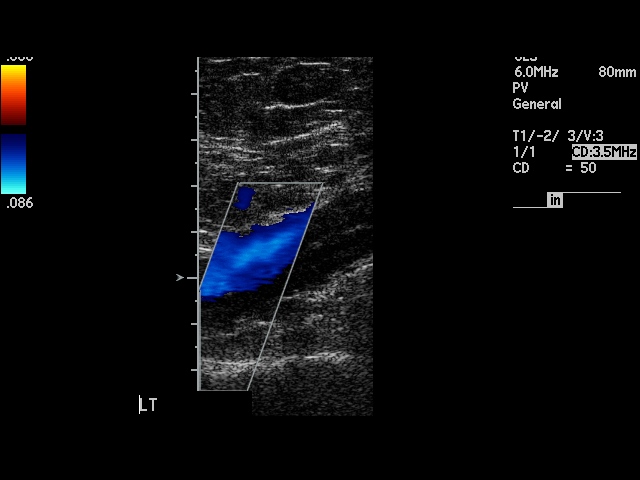
[im 21/25]
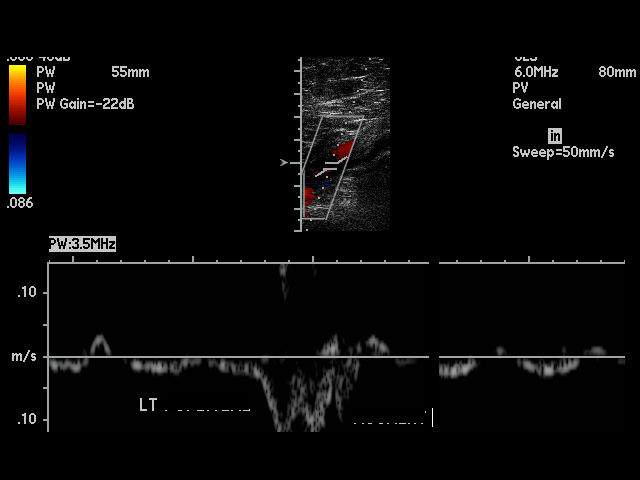
[im 22/25]
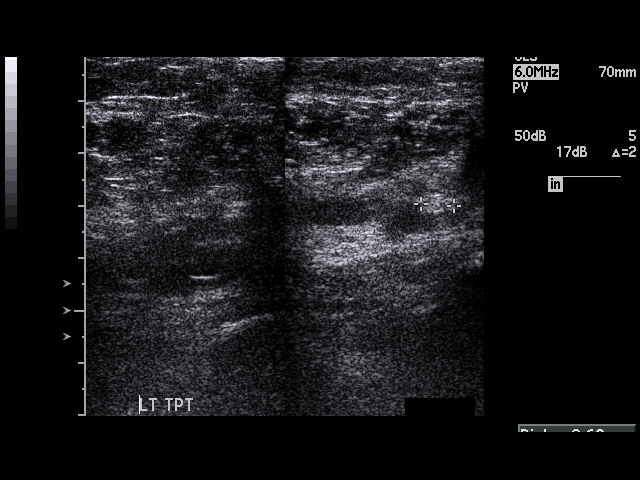
[im 25/25]
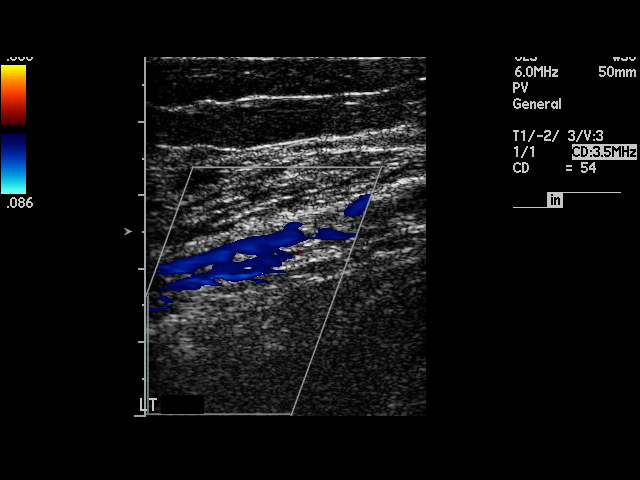

[17 of 24 positions shown; findings below may reference images not displayed]

IMPRESSION: Normal study, no deep venous thrombosis is identified.

## 2007-03-23 ENCOUNTER — Other Ambulatory Visit: Payer: Self-pay

## 2007-03-23 ENCOUNTER — Inpatient Hospital Stay: Payer: Self-pay | Admitting: Endocrinology

## 2007-04-21 IMAGING — US US EXTREM LOW VENOUS*L*
1 series · 17 of 22 positions shown · non-contrast
Comparison: none

REASON FOR EXAM: LLE pain and swelling  CALL REPORT [phone]
COMMENTS:

[Series 1: us extrem low venous*left* · 17 of 22 slices shown]
[im 1/22]
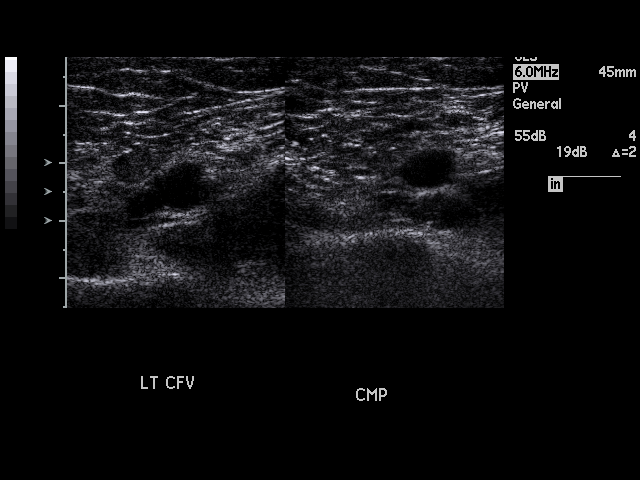
[im 2/22]
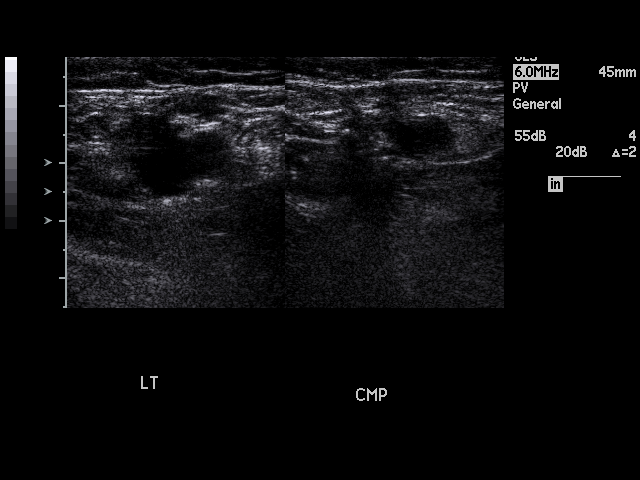
[im 4/22]
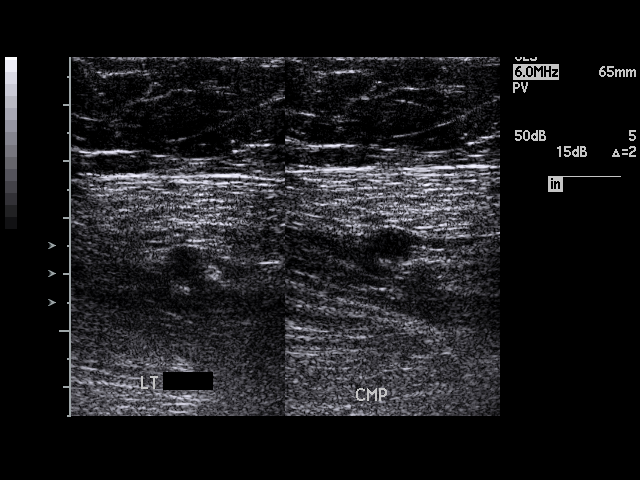
[im 5/22]
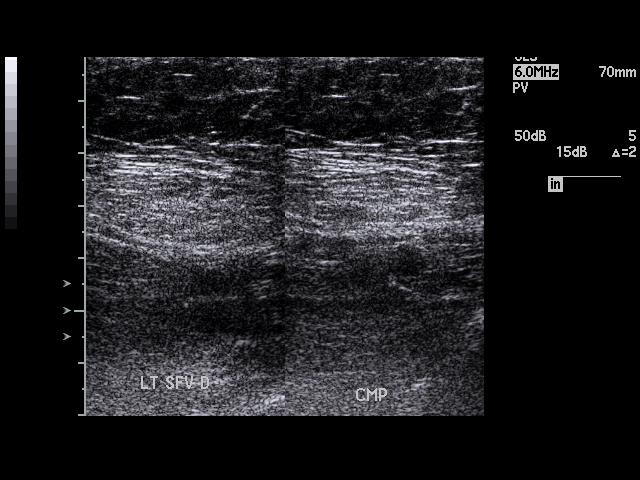
[im 6/22]
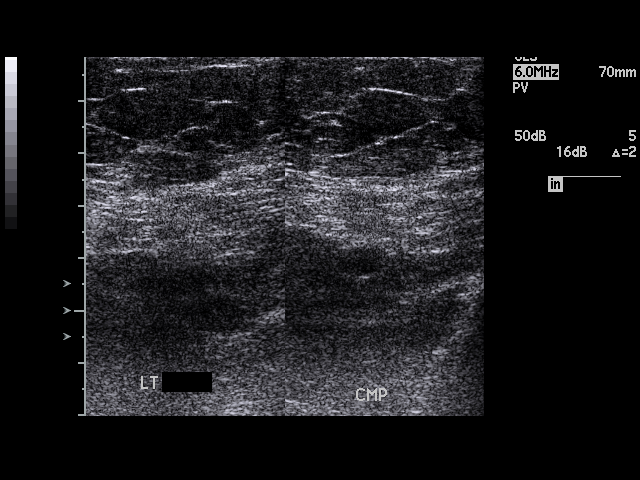
[im 8/22]
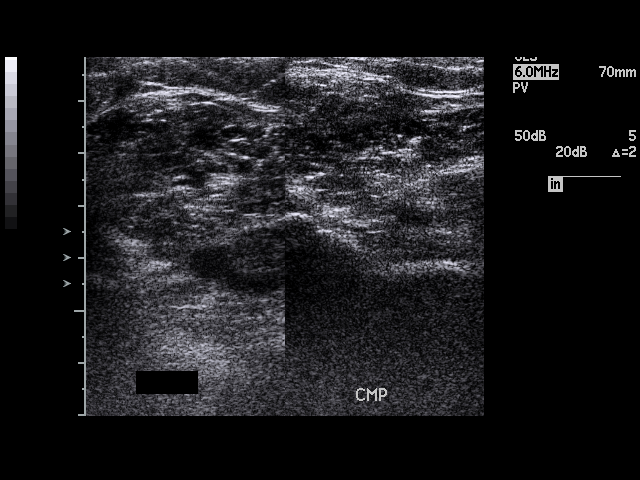
[im 9/22]
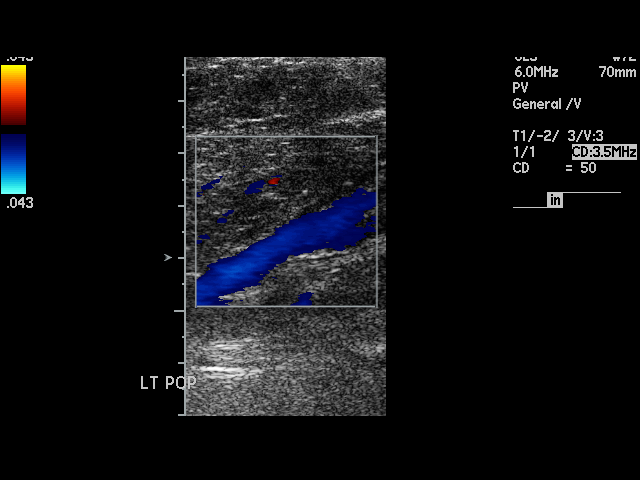
[im 10/22]
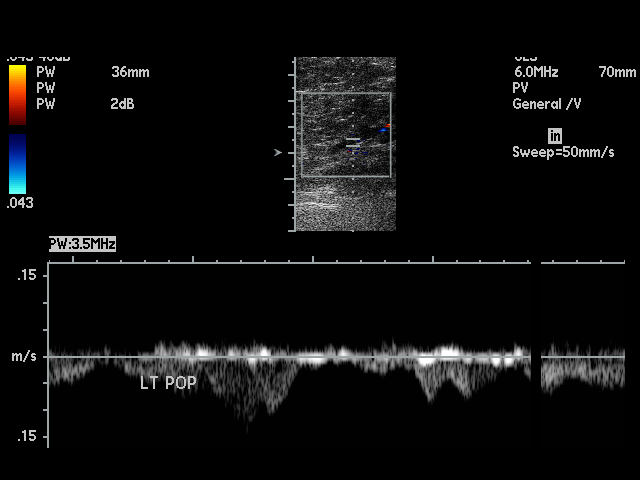
[im 12/22]
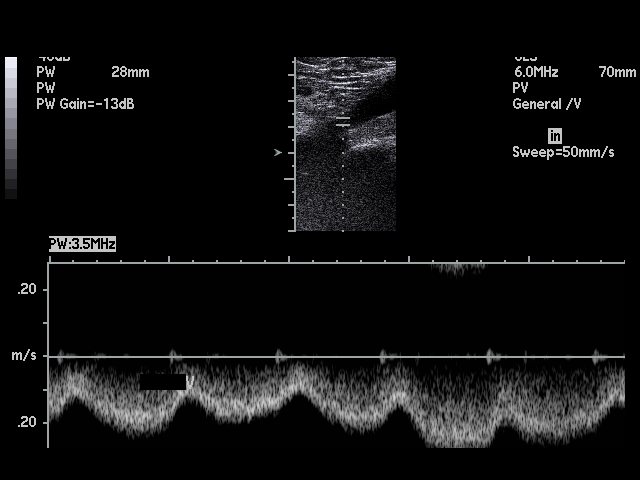
[im 13/22]
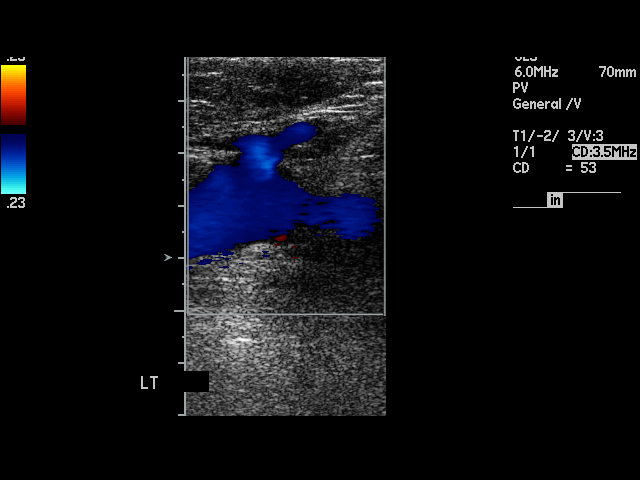
[im 14/22]
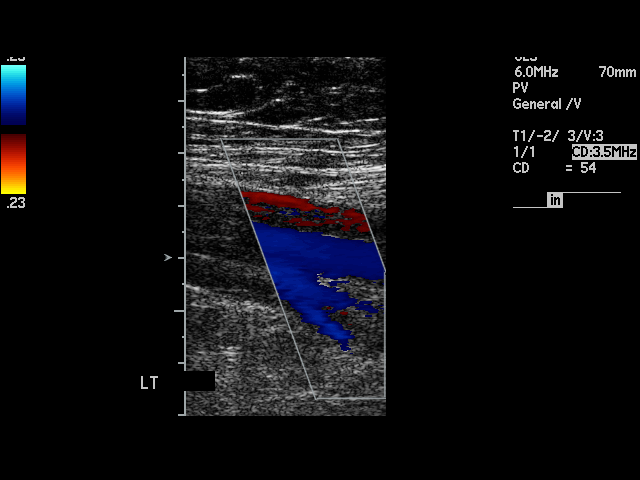
[im 15/22]
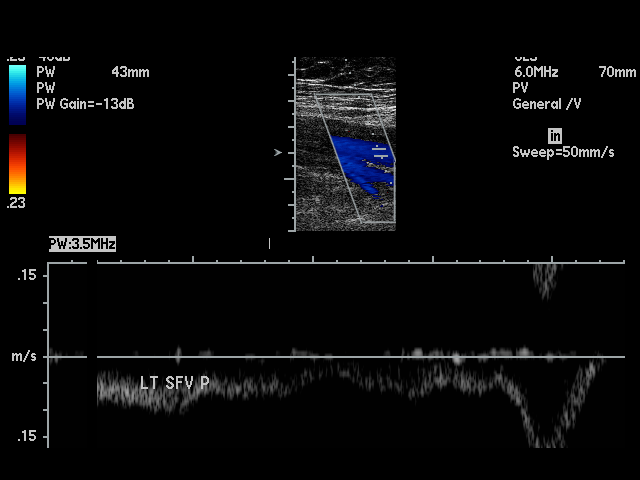
[im 17/22]
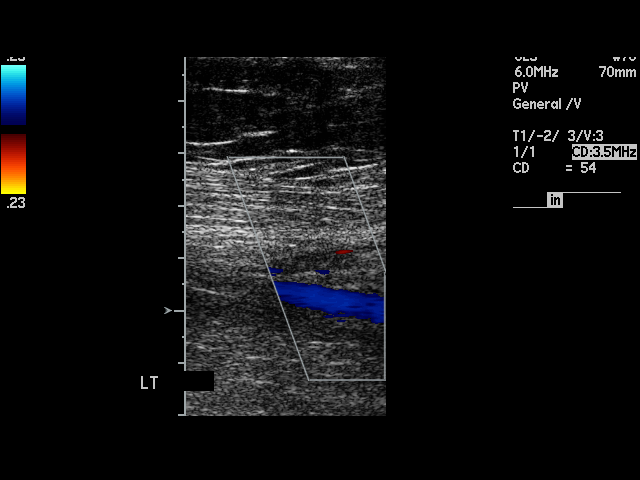
[im 18/22]
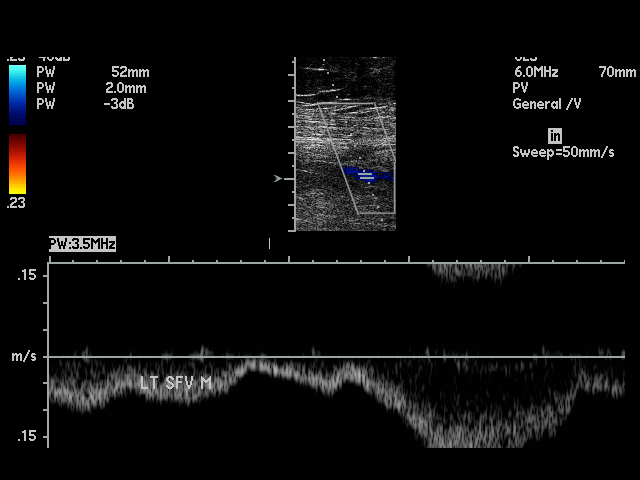
[im 19/22]
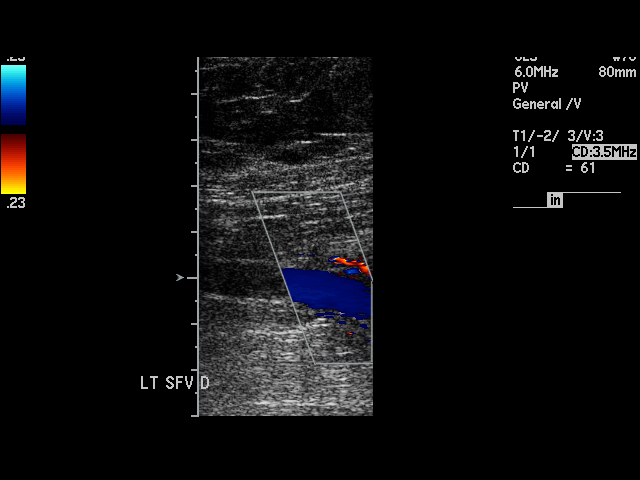
[im 21/22]
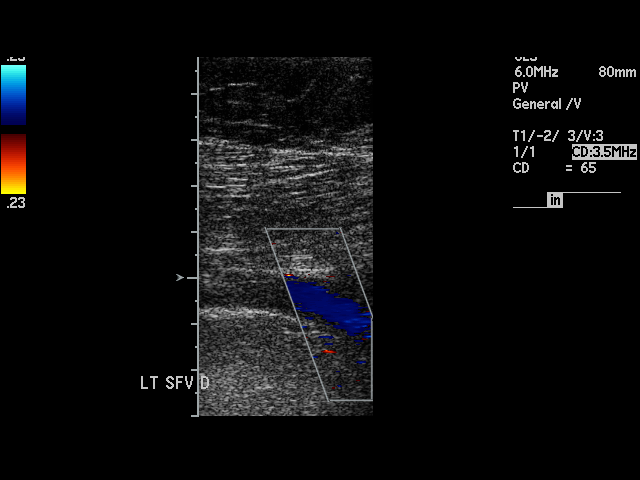
[im 22/22]
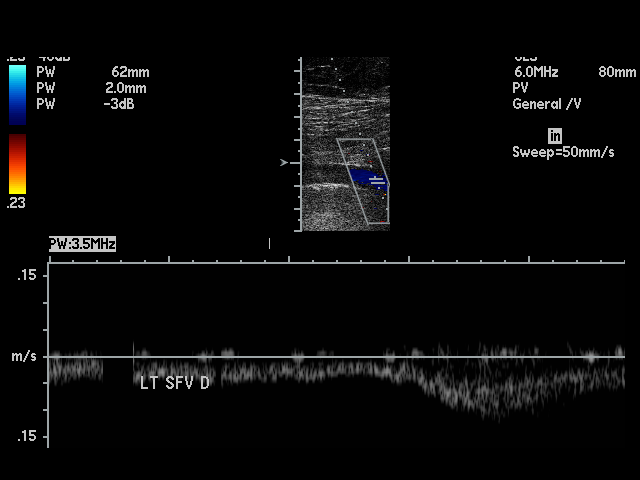

[17 of 22 positions shown; findings below may reference images not displayed]

PROCEDURE:     US  - US DOPPLER LOW EXTR LEFT  - April 16, 2005  [DATE]

RESULT:       Duplex Doppler evaluation of the deep venous system of the
LEFT leg was obtained from the inguinal to the popliteal region and shows
the deep venous structures are fully compressible.  The color and SPECTRAL
Doppler appearance is normal.  There is normal response to distal
augmentation.  No abnormal internal echoes are seen.
IMPRESSION: No evidence of LEFT lower extremity deep venous thrombosis.

The findings were phoned to the referring physician at the time of the
completion of the study.

## 2007-05-05 ENCOUNTER — Emergency Department: Payer: Self-pay | Admitting: Emergency Medicine

## 2007-06-02 ENCOUNTER — Emergency Department: Payer: Self-pay | Admitting: Emergency Medicine

## 2007-10-06 ENCOUNTER — Other Ambulatory Visit: Payer: Self-pay

## 2007-10-06 ENCOUNTER — Inpatient Hospital Stay: Payer: Self-pay | Admitting: Endocrinology

## 2007-10-08 ENCOUNTER — Other Ambulatory Visit: Payer: Self-pay

## 2008-02-06 ENCOUNTER — Inpatient Hospital Stay: Payer: Self-pay | Admitting: Endocrinology

## 2008-02-06 ENCOUNTER — Other Ambulatory Visit: Payer: Self-pay

## 2008-07-31 IMAGING — CT CT HEAD W/O CM
1 of 2 series · 13 of 30 positions shown, 17 images · non-contrast
Comparison: Brain MRI of 03/26/04 and head CT of 03/26/04.

CLINICAL DATA: Code Stroke, right-sided weakness and dysarthria.  
 HEAD CT WITHOUT CONTRAST ? 07/27/06:
TECHNIQUE: Contiguous axial CT images were obtained from the base of the skull through the vertex according to standard protocol without contrast.

[Series 2: brain · axial · 0.47mm/px · z∈[+125,+248]mm · 13 of 28 slices shown, 17 images]
[im 2/28  brain]
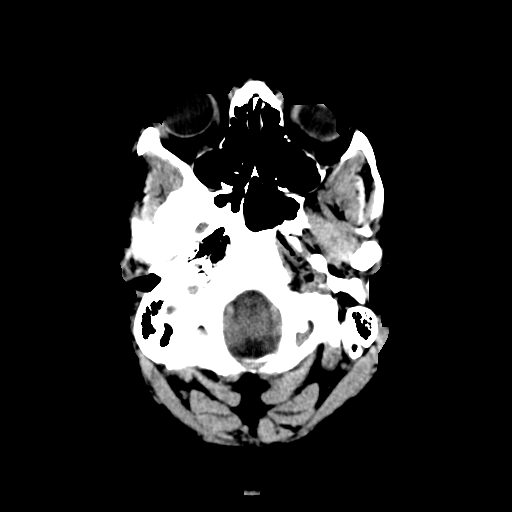
[im 2/28  bone]
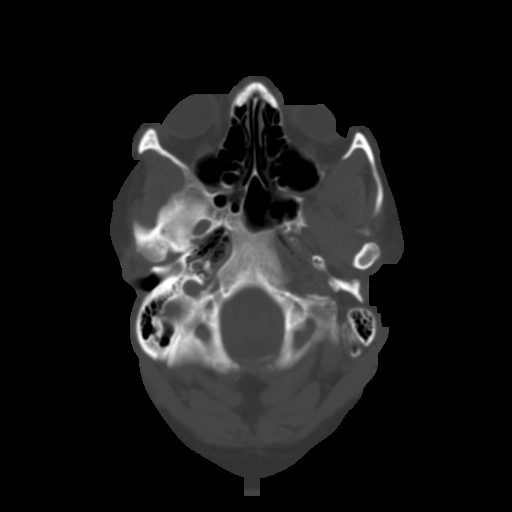
[im 4/28  brain]
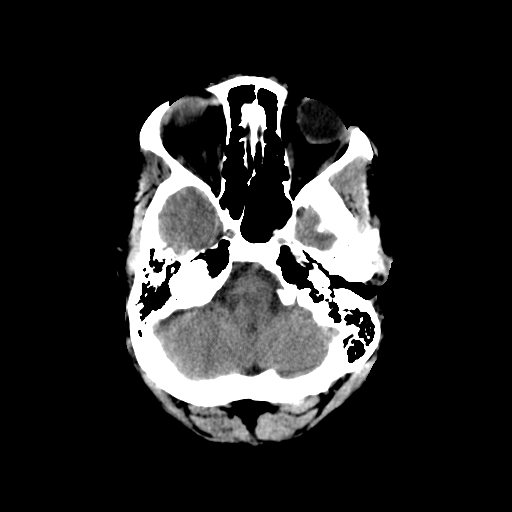
[im 6/28  brain]
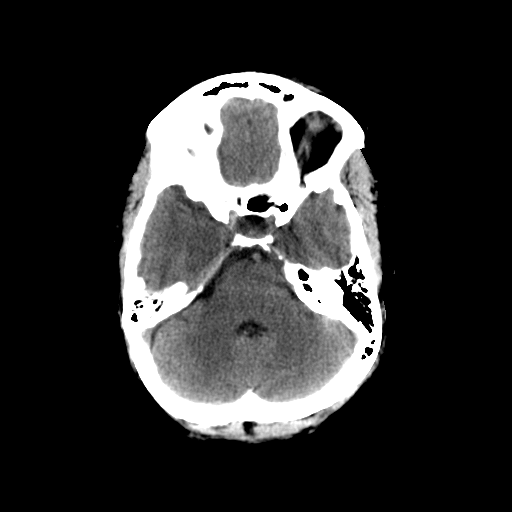
[im 8/28  brain]
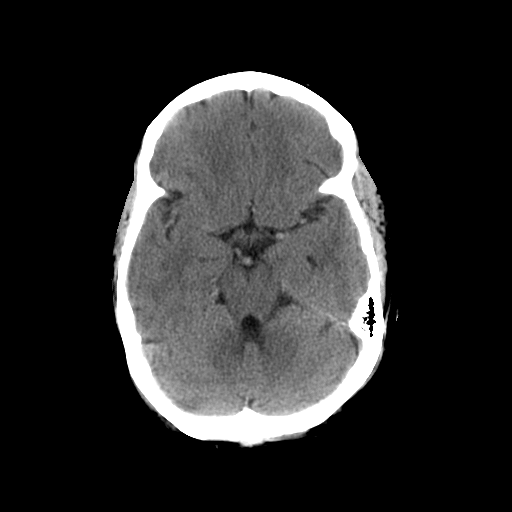
[im 10/28  brain]
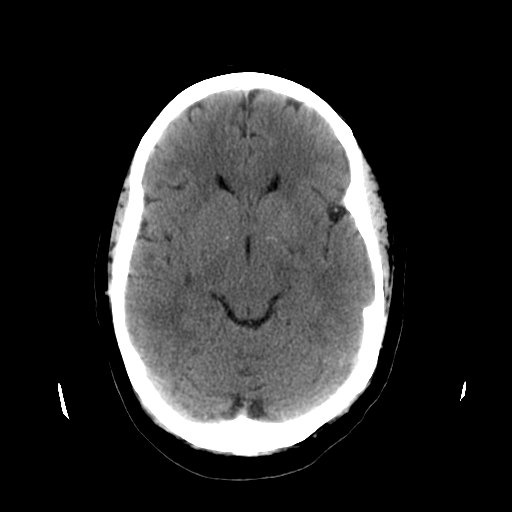
[im 10/28  bone]
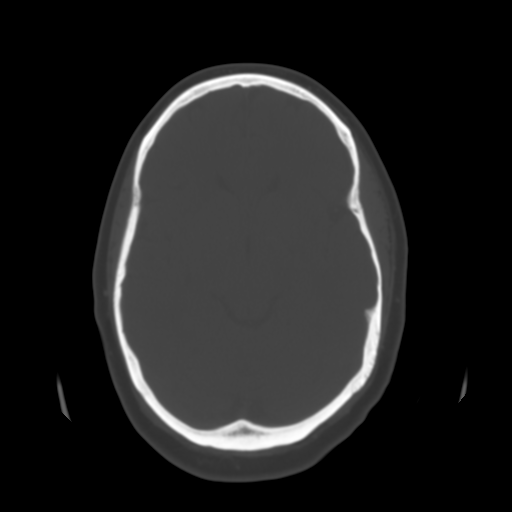
[im 12/28  brain]
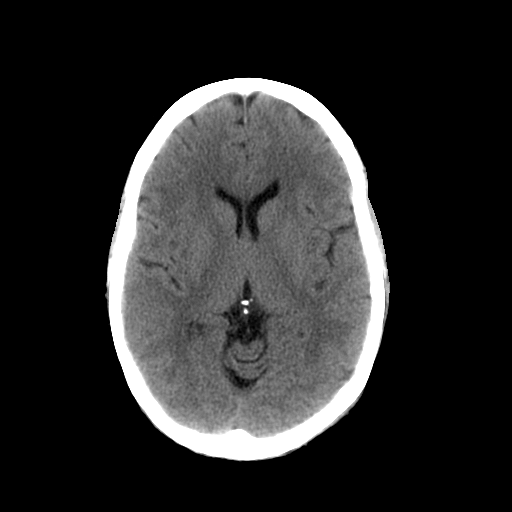
[im 14/28  brain]
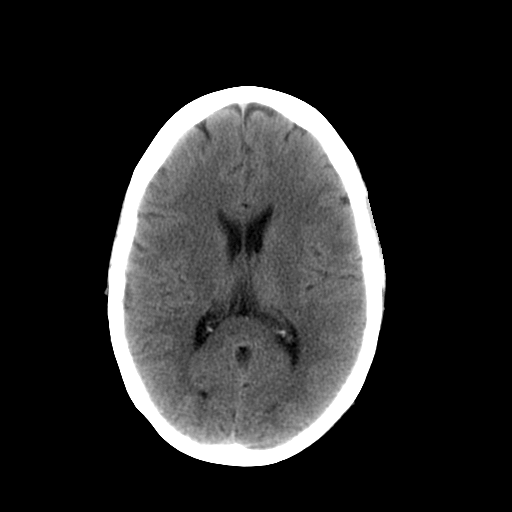
[im 16/28  brain]
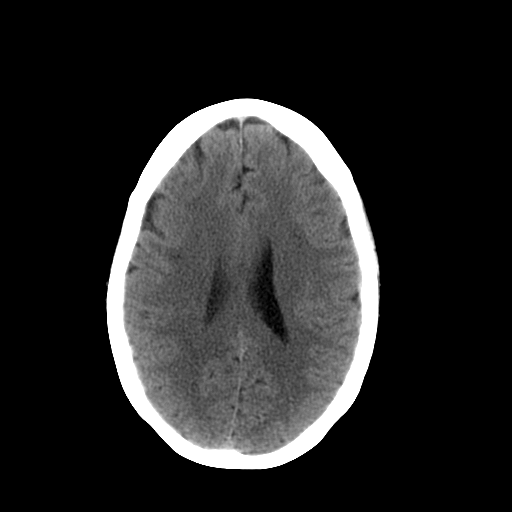
[im 18/28  brain]
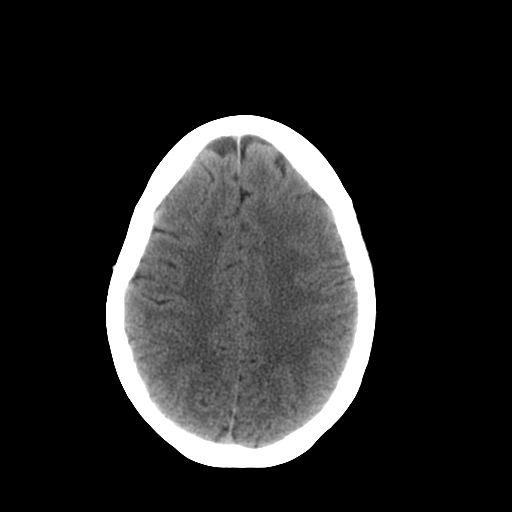
[im 18/28  bone]
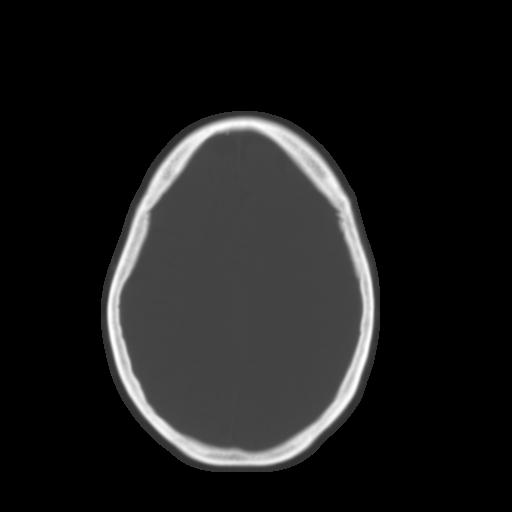
[im 20/28  brain]
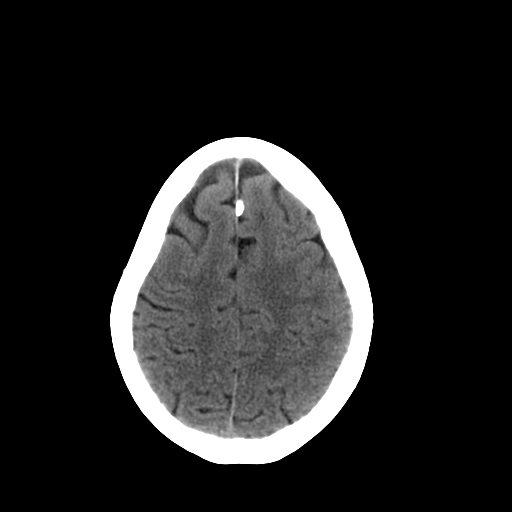
[im 22/28  brain]
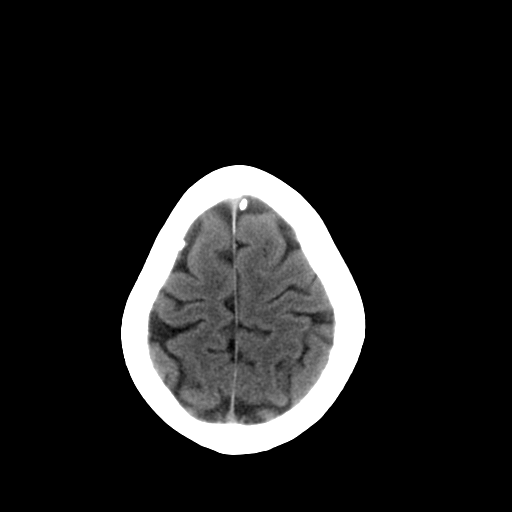
[im 24/28  brain]
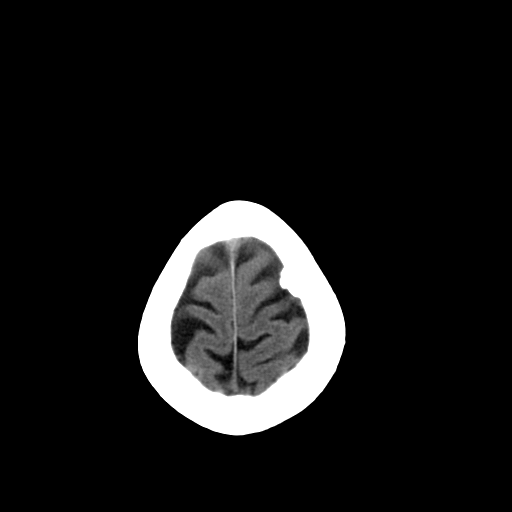
[im 26/28  brain]
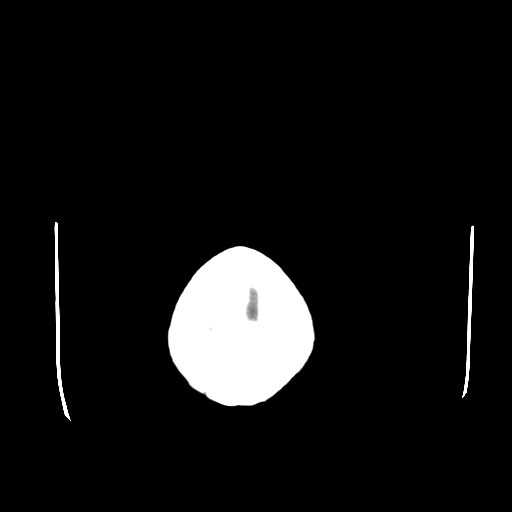
[im 26/28  bone]
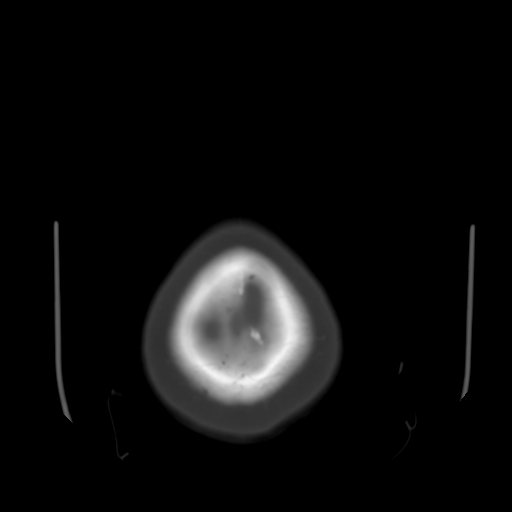

[13 of 30 positions shown; findings below may reference images not displayed]

FINDINGS: There is no evidence of intracranial hemorrhage, brain edema, or mass effect.  No other intra-axial abnormalities are seen, and the ventricles are within normal limits.  No abnormal extra-axial fluid collections or masses are identified.  No skull abnormalities are noted.  Minimal periventricular white matter hypodensity is again noted.
IMPRESSION: Negative non-contrast head CT.

## 2008-07-31 IMAGING — MR MR HEAD WO/W CM
10 of 15 series · 25 of 48 positions shown · IV contrast (magnevist)
Comparison: CT scan of earlier today and MR of 03/26/04.

CLINICAL DATA: Episode of right-sided numbness.  
 MRI BRAIN WITHOUT AND WITH CONTRAST:
TECHNIQUE: Multiplanar and multiecho pulse sequences of the brain and surrounding structures were obtained according to standard protocol before and after administration of intravenous contrast.
 Contrast:  20 cc Magnevist.
TECHNIQUE: 2-D and 3-D time-of-flight pulse sequences were performed to examine the cranial vasculature from the aortic arch to the circle of Willis, centered at the carotid bifurcation, before and during bolus injection of intravenous contrast.  Multiplanar MR image reconstructions were generated to evaluate the vascular anatomy.
TECHNIQUE: 3-D time of flight pulse sequence was performed to examine the cerebral vasculature, centered at the circle of Willis, without IV contrast.  Multiplanar MR image reconstructions were generated to evaluate the vascular anatomy.

[Series 1: 3 plane loc · axial · 5.0mm · 0.94mm/px · 1 of 9 slices shown]
[im 1/9]
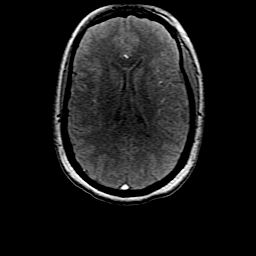

[Series 2: T1 · sagittal · 5.0mm · 0.43mm/px · 1 of 17 slices shown]
[im 1/17]
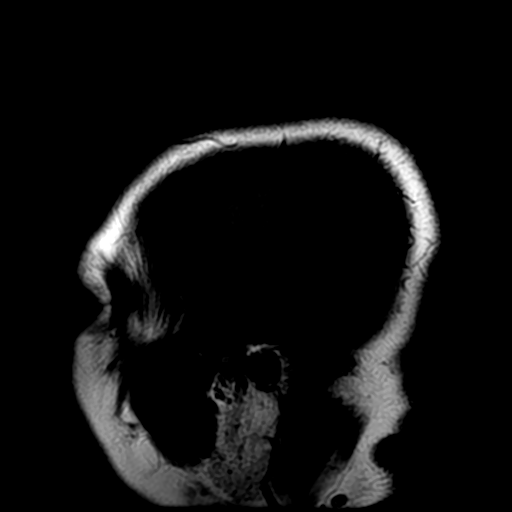

[Series 3: DWI · axial · 5.0mm · 1.25mm/px · z∈[-93,+44]mm · 2 of 52 slices shown]
[im 1/52]
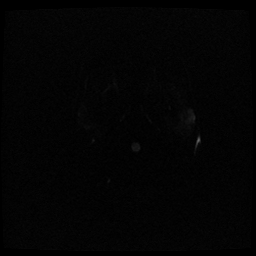
[im 52/52]
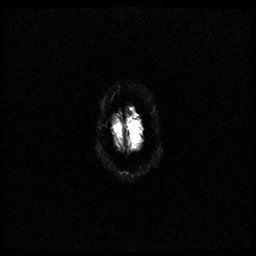

[Series 5: T2 · axial · 5.0mm · 0.43mm/px · 1 of 19 slices shown (1 of 2)]
[im 1/19]
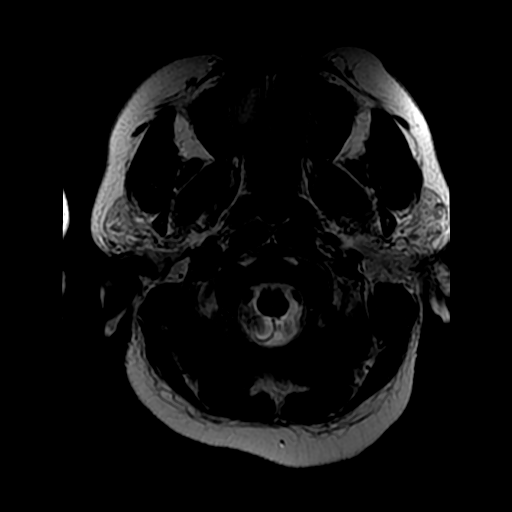

[Series 6: FLAIR · axial · 5.0mm · 0.43mm/px · 1 of 19 slices shown]
[im 1/19]
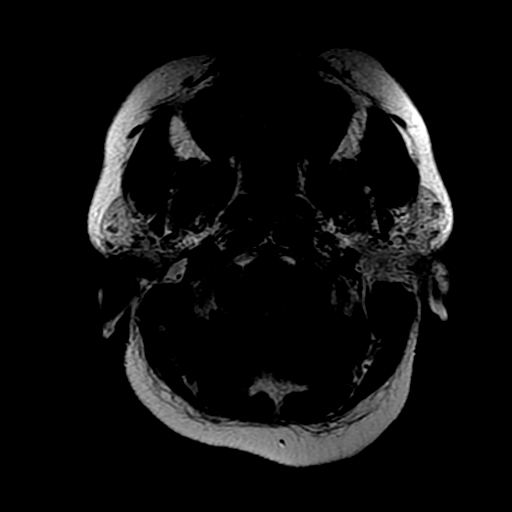

[Series 8: T2 · coronal · 5.0mm · 0.43mm/px · 1 of 22 slices shown (2 of 2)]
[im 1/22]
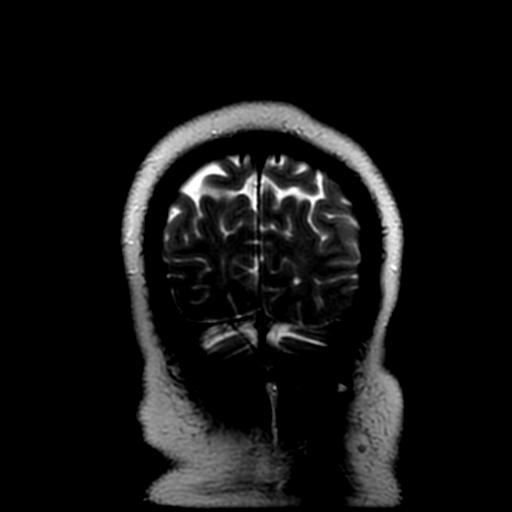

[Series 9: loc cor · axial · 5.0mm · 0.68mm/px · 1 of 21 slices shown]
[im 1/21]
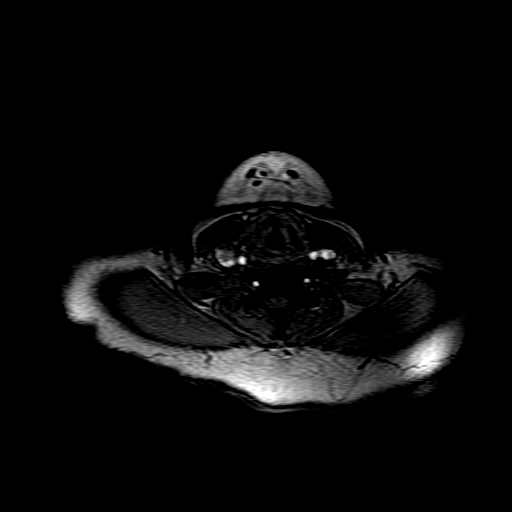

[Series 10: ax (id) spgr · axial · 2.6mm · 0.78mm/px · z∈[-241,-51]mm · 7 of 151 slices shown]
[im 1/151]
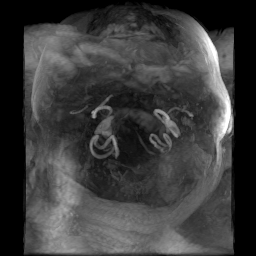
[im 26/151]
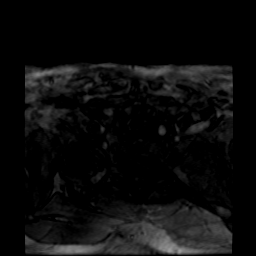
[im 51/151]
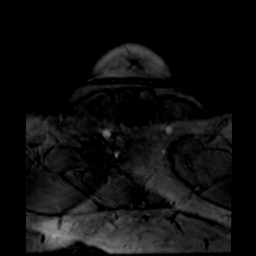
[im 76/151]
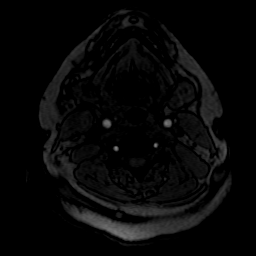
[im 101/151]
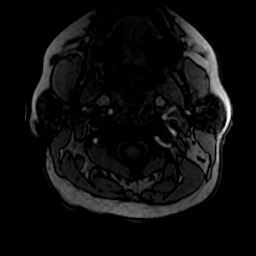
[im 126/151]
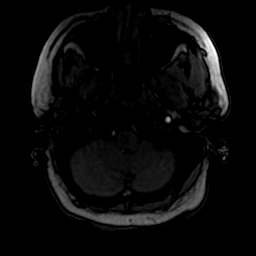
[im 151/151]
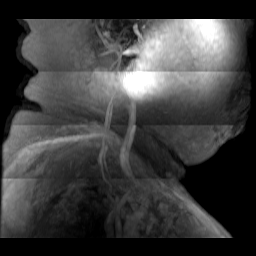

[Series 11: mask · sagittal · 1.4mm · 0.55mm/px · 6 of 132 slices shown]
[im 1/132]
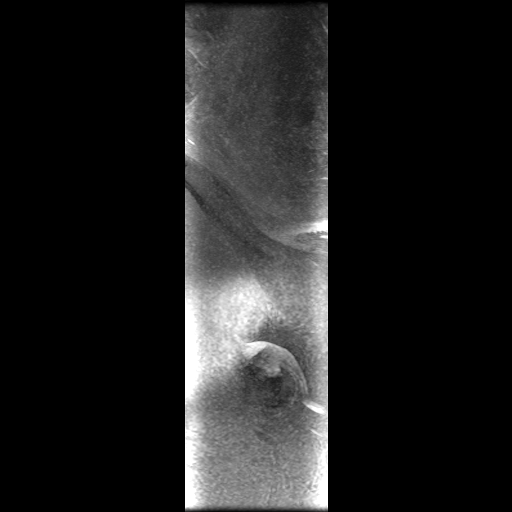
[im 27/132]
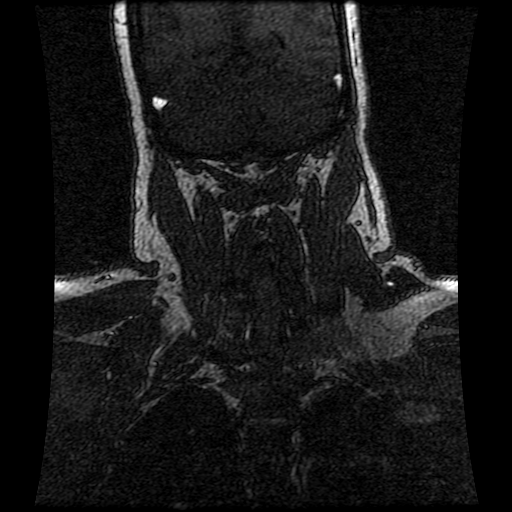
[im 53/132]
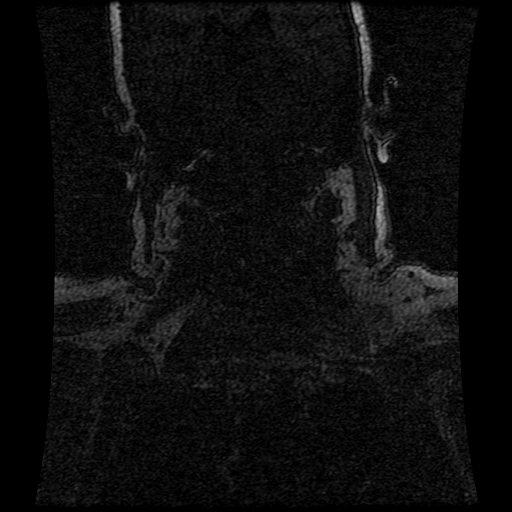
[im 79/132]
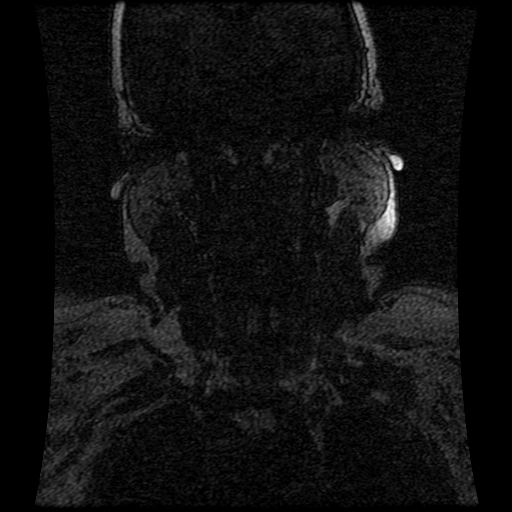
[im 105/132]
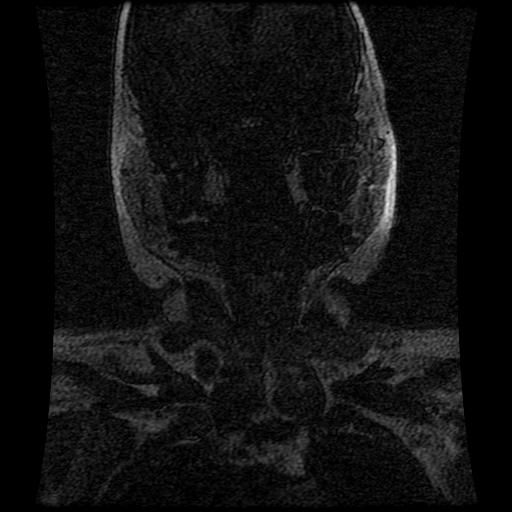
[im 132/132]
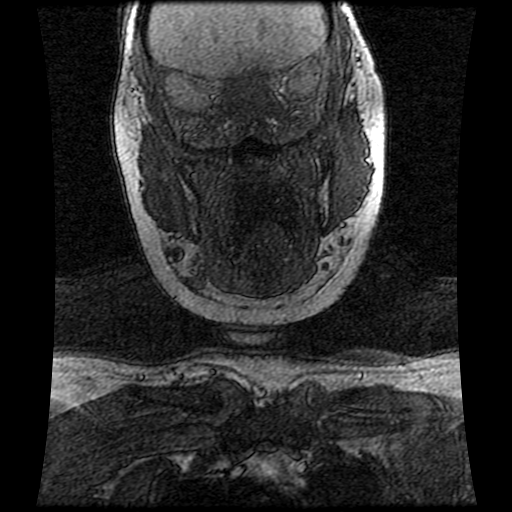

[Series 12: fl tr cor · coronal · 1.4mm · 0.55mm/px · 4 of 224 slices shown]
[im 1/224]
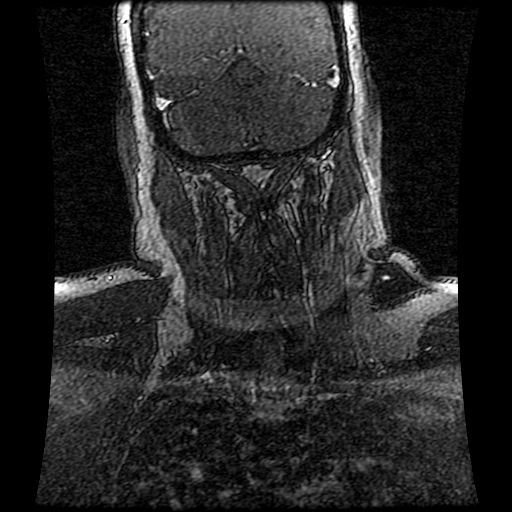
[im 45/224]
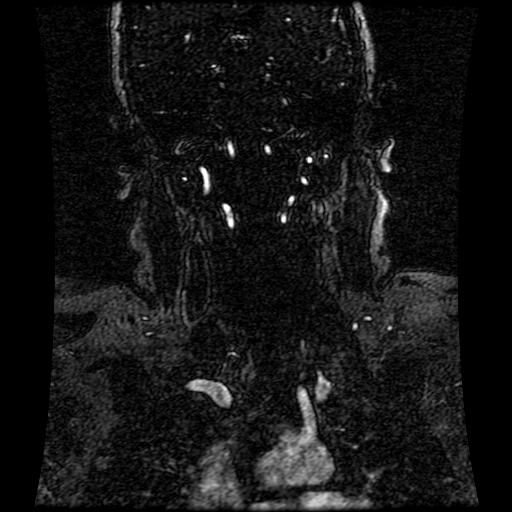
[im 67/224]
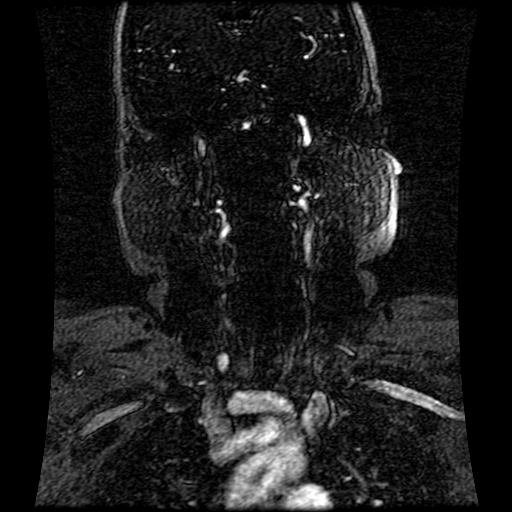
[im 90/224]
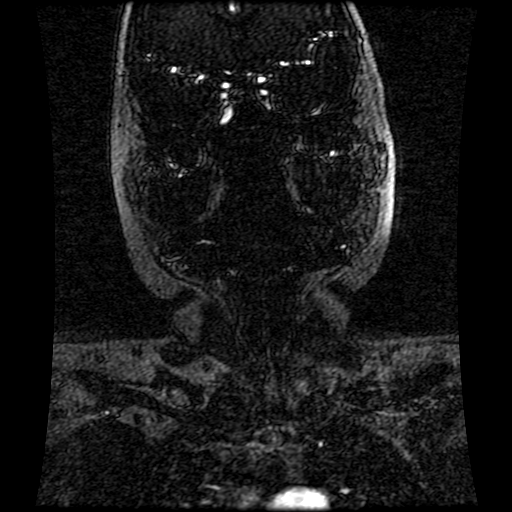

[25 of 48 positions shown; findings below may reference images not displayed]

FINDINGS: Diffusion imaging is negative for acute or subacute stroke.  Brainstem and cerebellum are normal.  There is some minor small vessel changes in the deep white matter, most of which were present in 6003, but are better seen today possibly due to technical improvements.  No cortical or large vessel stroke.  No evidence of mass, hemorrhage, hydrocephalus, or extra-axial collection.  The pituitary gland is unremarkable.  Paranasal sinuses, middle ears and mastoids are clear.
IMPRESSION: No evidence of acute pathology.  Mild small vessel change in the hemispheric white matter, similar to the examination March 2004.  
 MR ANGIOGRAPHY OF NECK:
FINDINGS: The branching pattern of the brachiocephalic vessels from the arch is normal.  Both vertebral arteries are patent and approximately equal in size.  No stenosis suspected.  Both common carotid arteries are widely patent to their respective bifurcation.  Both carotid bifurcations are normal with no narrowing or irregularity in either carotid siphon region.  Both external carotid arteries are normal.
IMPRESSION: Normal MR angiography of the neck vessels.  No stenosis or irregularity.  
 MR ANGIOGRAPHY OF HEAD:
FINDINGS: Both internal carotid arteries are widely patent into the brain.  The anterior middle cerebral vessels are patent bilaterally without proximal stenosis, aneurysm or vascular malformation.  Both vertebral arteries are patent to form a normal caliber basilar artery.  Cerebellar circulation is intact.  Both posterior cerebral arteries appear normal.
IMPRESSION: Normal intracranial MR angiography of the large and medium sized vessels.

## 2008-07-31 IMAGING — CR DG CHEST 1V PORT
1 series · 1 of 1 positions shown · non-contrast
Comparison: 03/24/04.

CLINICAL DATA: Code Stroke.  
 PORTABLE CHEST - 1 VIEW - 07/27/06:

[view not recorded]
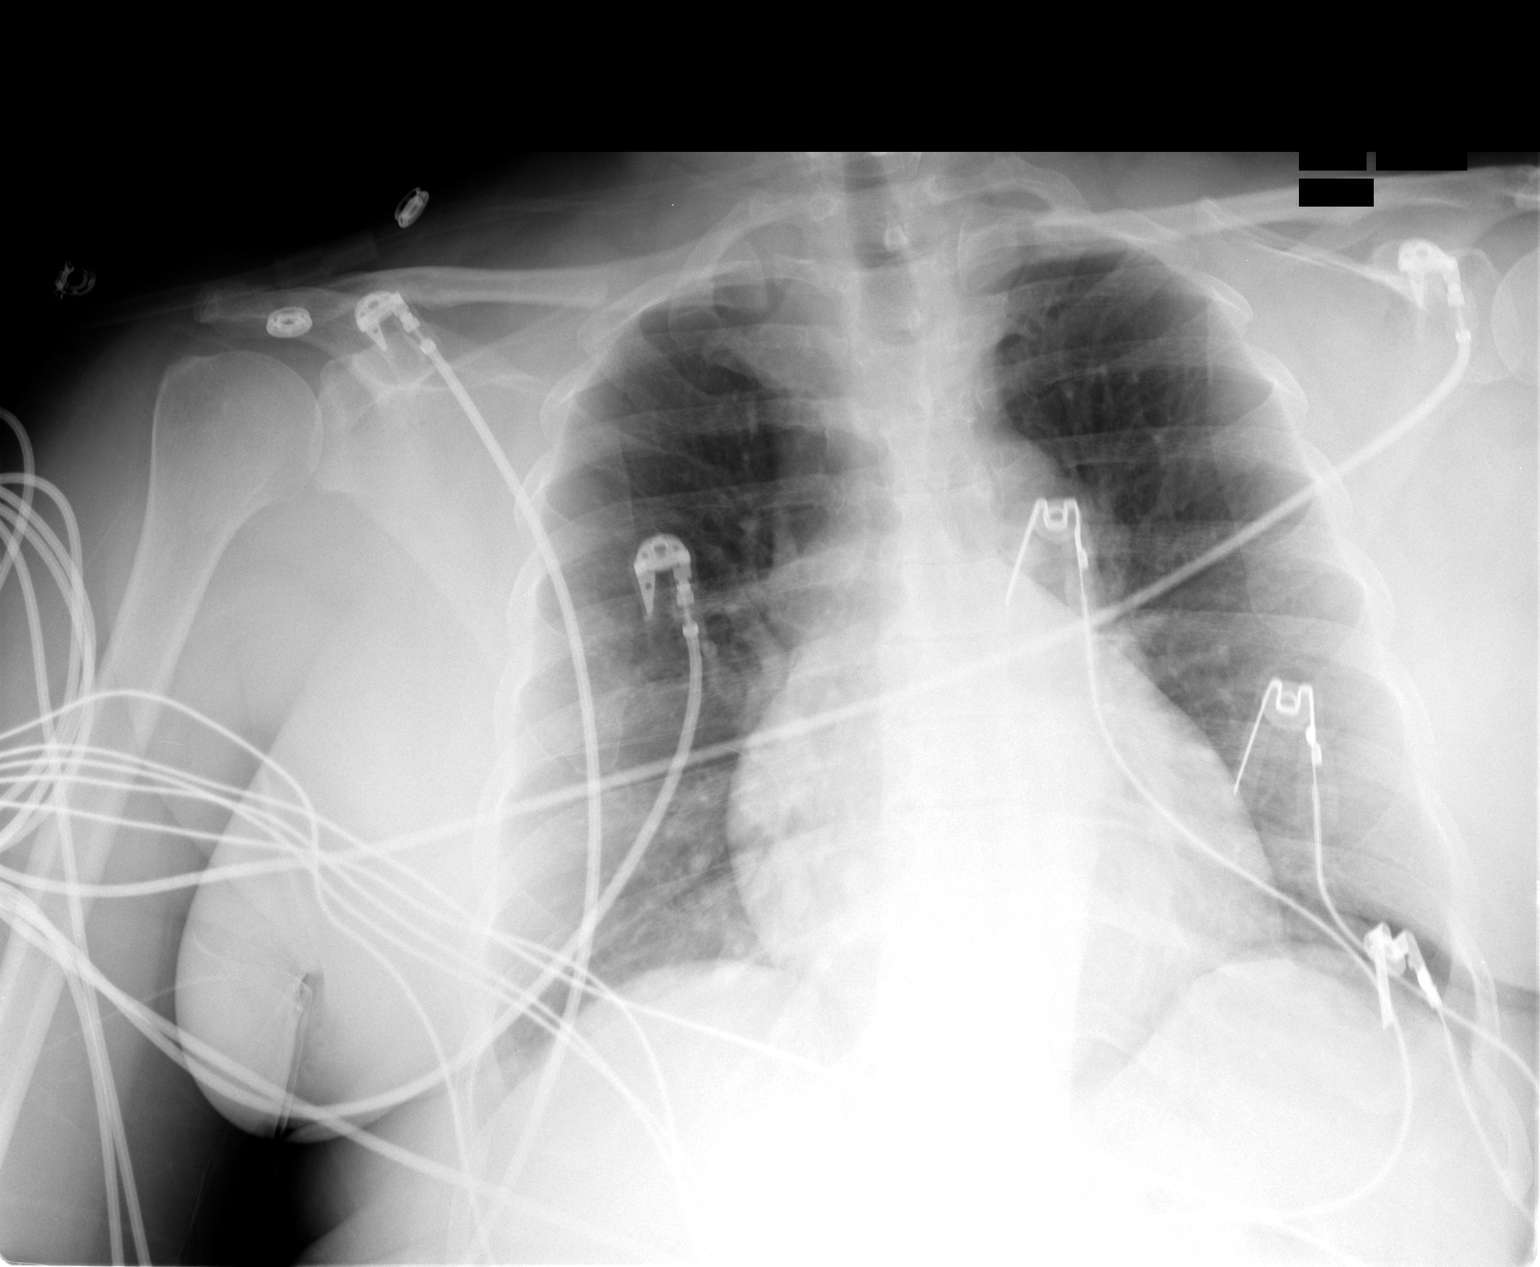

[1 of 1 positions shown; findings below may reference images not displayed]

FINDINGS: Borderline cardiomegaly is noted but the lungs are clear.  Cardiac leads overlie the chest.
IMPRESSION: Borderline cardiomegaly.  Clear lungs.

## 2008-10-22 IMAGING — CR STERNUM - 2+ VIEW
1 series · 4 of 4 positions shown · non-contrast
Comparison: none

REASON FOR EXAM: Change over time
COMMENTS:

[Series 1: view not recorded · 0.17mm/px · 4 of 4 slices shown]
[im 1/4]
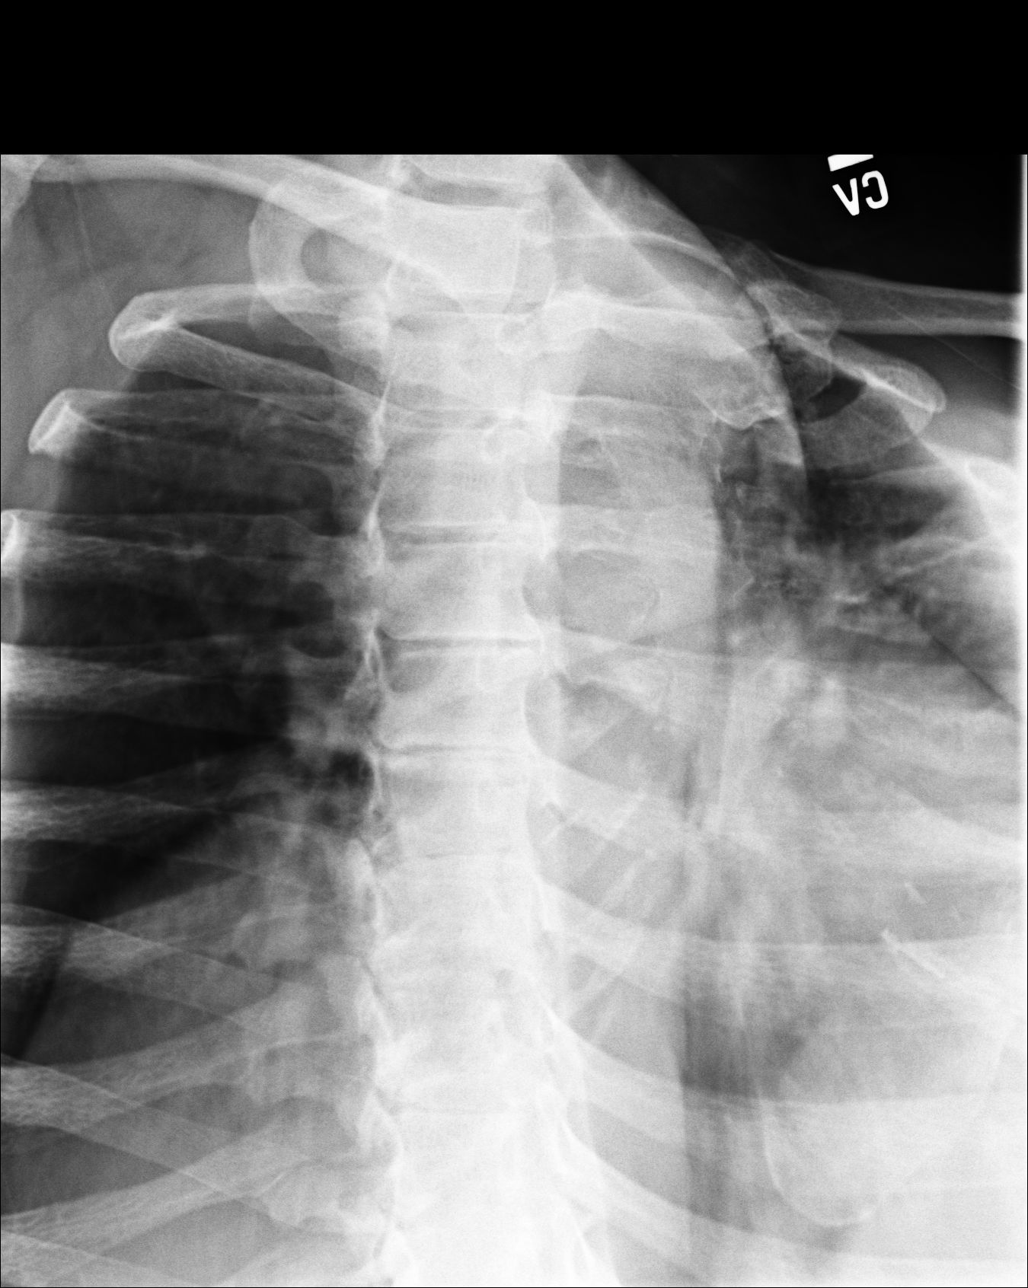
[im 2/4]
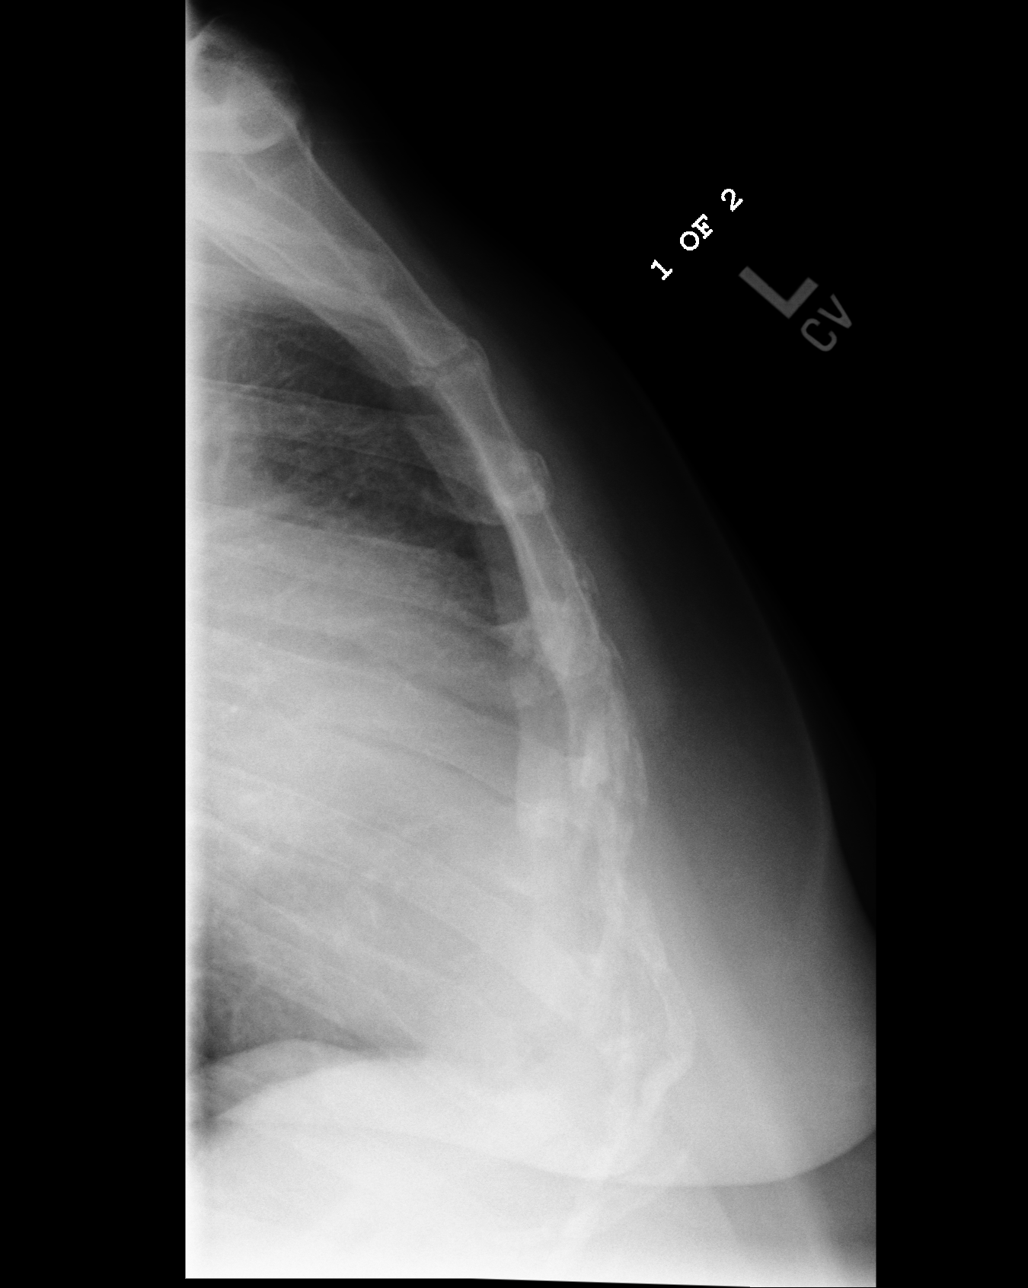
[im 3/4]
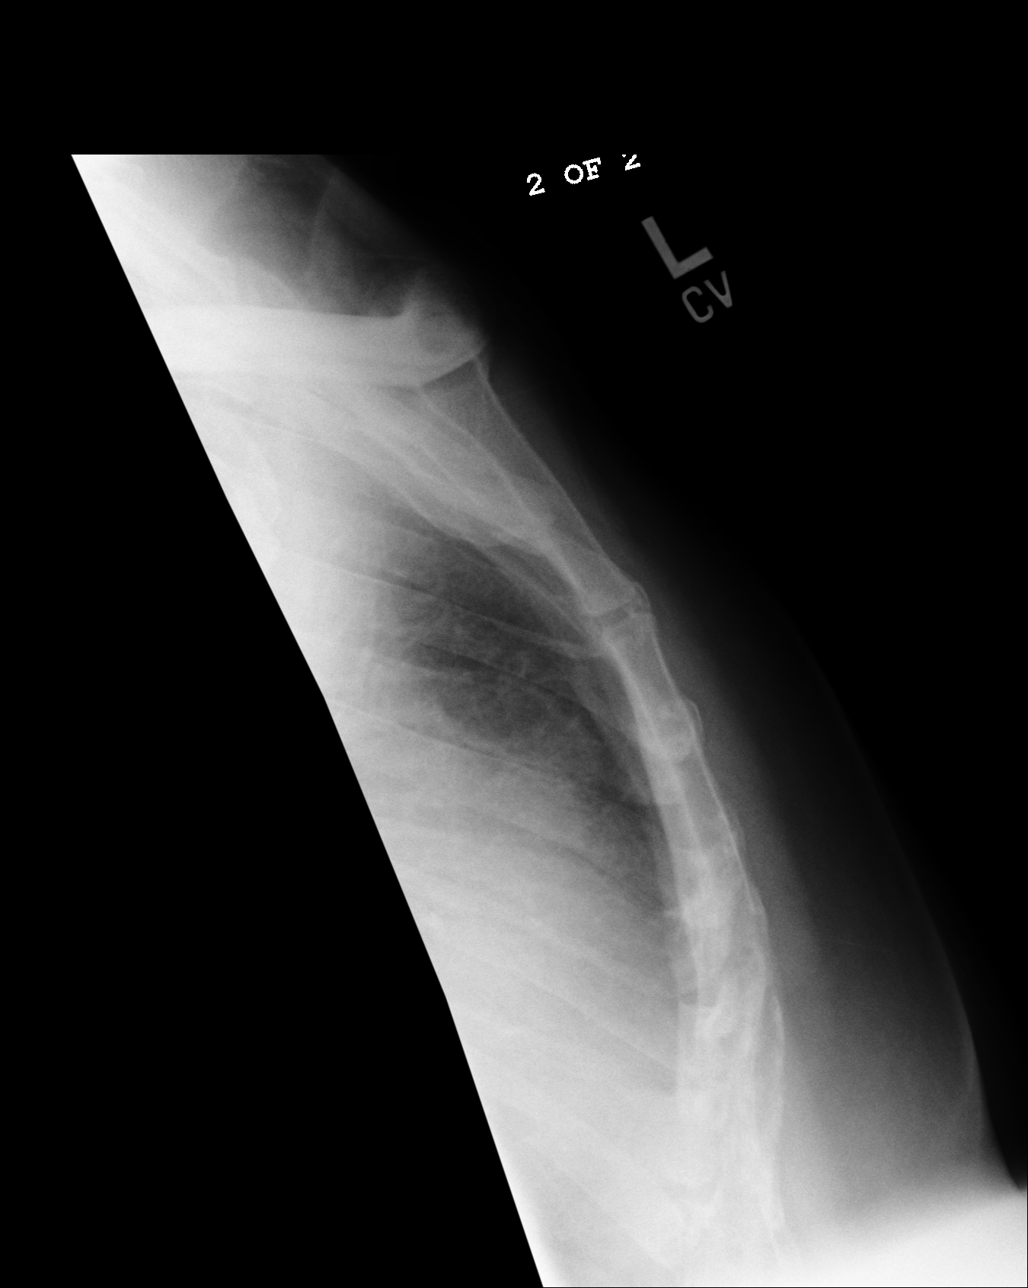
[im 4/4]
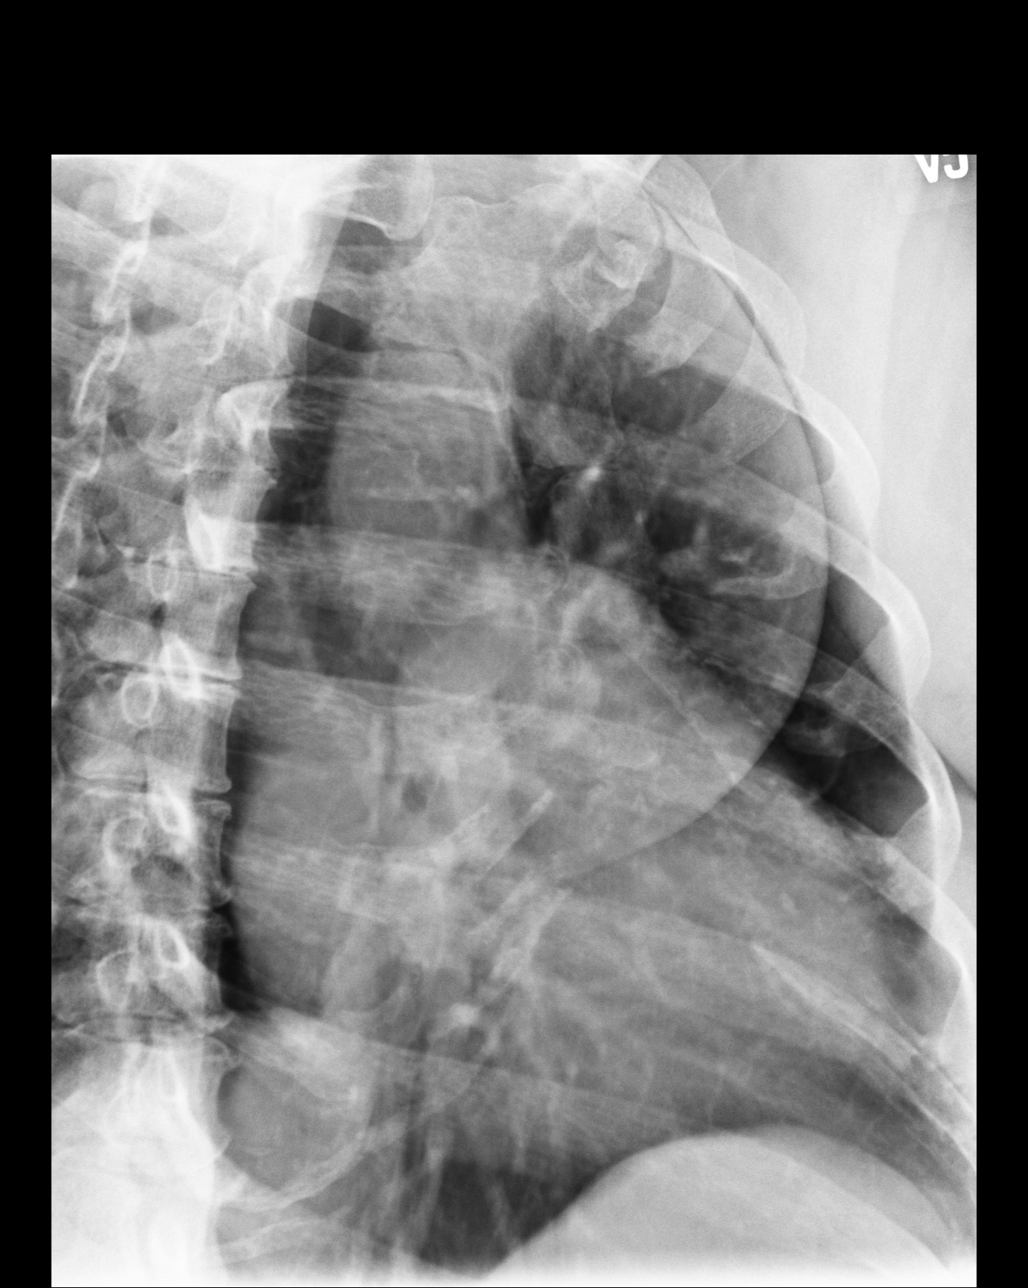

[4 of 4 positions shown; findings below may reference images not displayed]

PROCEDURE:     DXR - DXR STERNUM  - October 18, 2006 [DATE]

RESULT:        Views of the sternum show no definite fracture.  The anterior
oblique views are marginal in quality because of respiratory motion
artifact.  The lateral views are slightly compromised because of prominent
calcification in the costal cartilages.  If there is continued concern for
fracture, then CT could be considered.
IMPRESSION: No definite fracture identified.  No destructive bony
lesion.

## 2008-10-22 IMAGING — US US THYROID
1 series · 17 of 25 positions shown · non-contrast
Comparison: none

REASON FOR EXAM: thyromegaly
COMMENTS:

[Series 1: us thyroid · 17 of 33 slices shown]
[im 1/33]
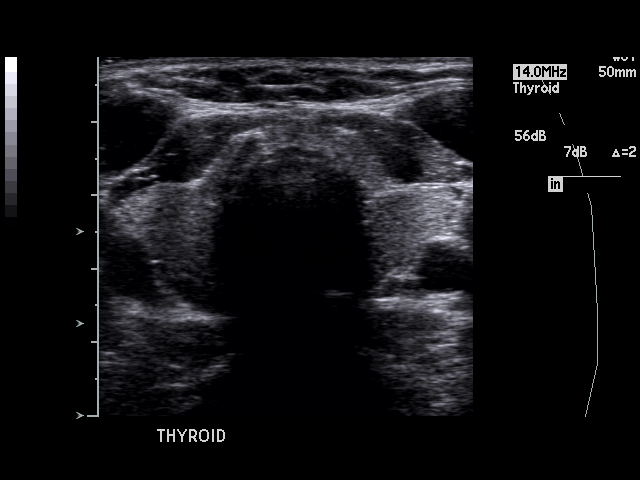
[im 3/33]
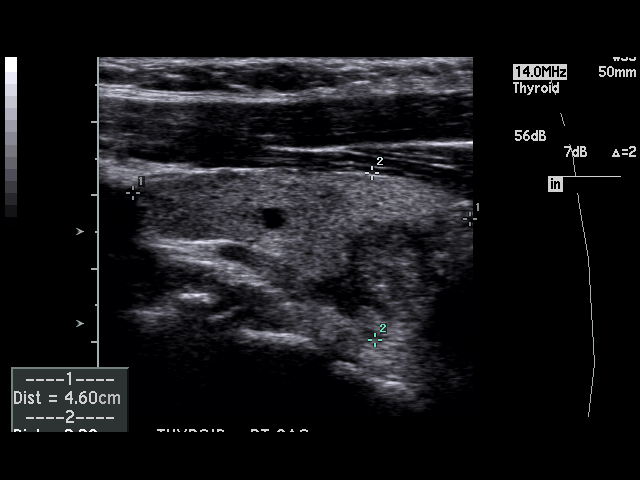
[im 5/33]
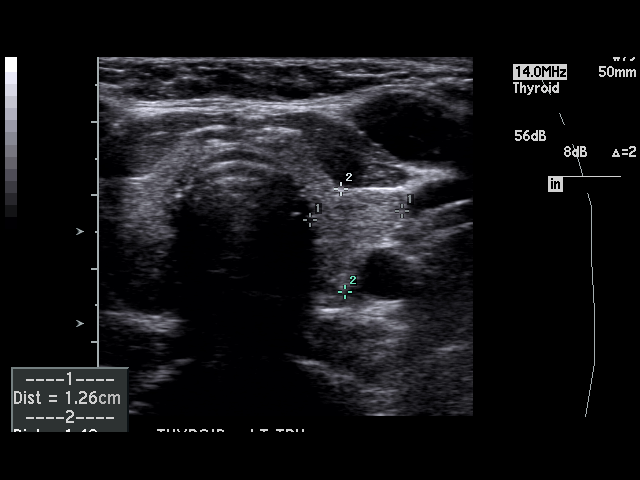
[im 7/33]
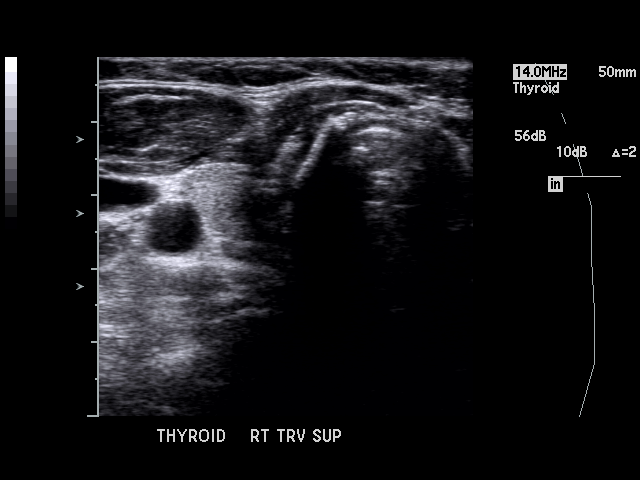
[im 9/33]
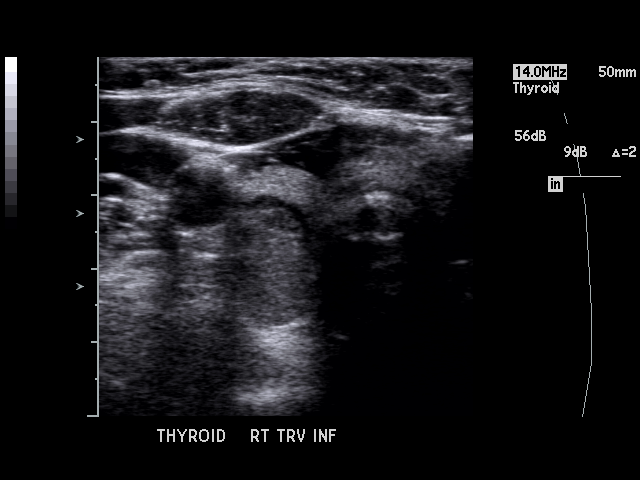
[im 11/33]
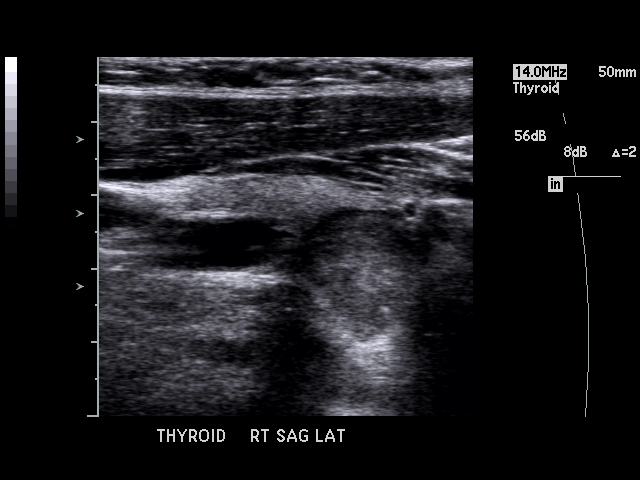
[im 13/33]
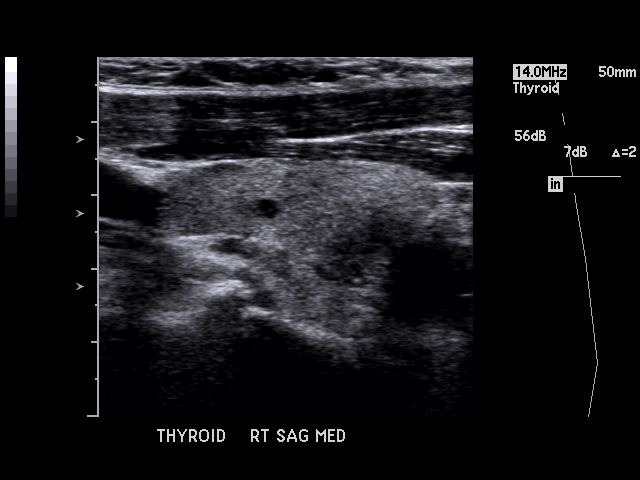
[im 15/33]
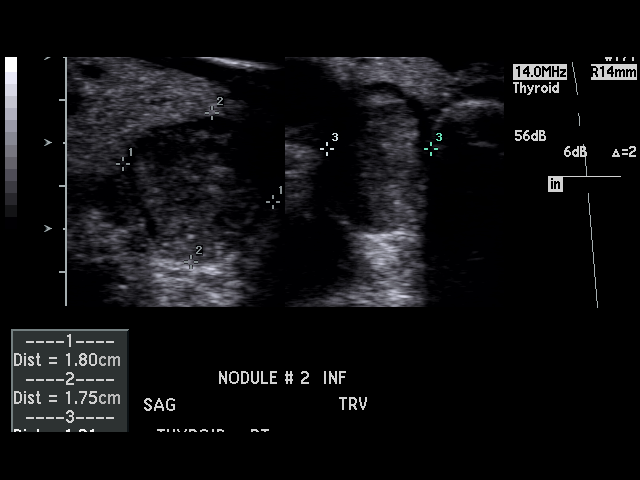
[im 17/33]
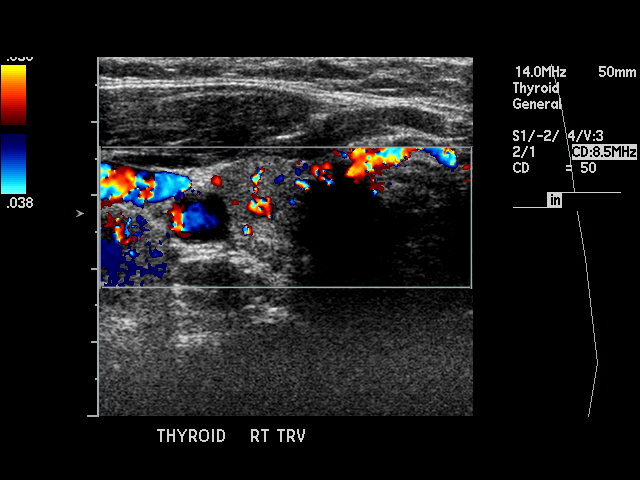
[im 18/33]
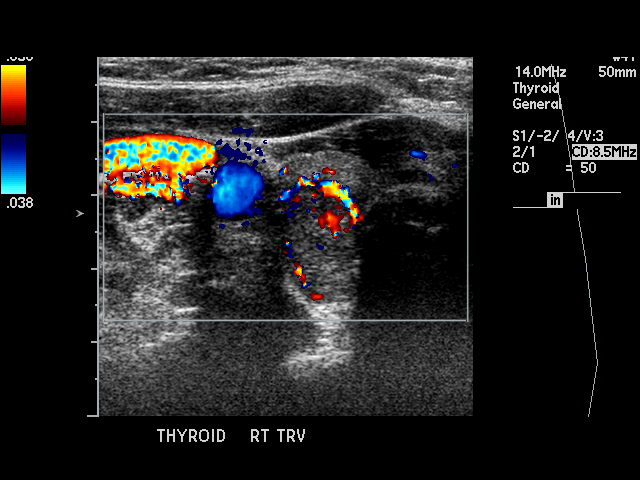
[im 21/33]
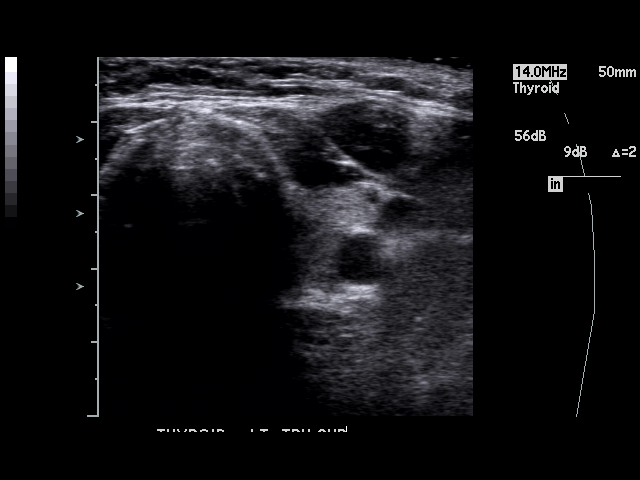
[im 22/33]
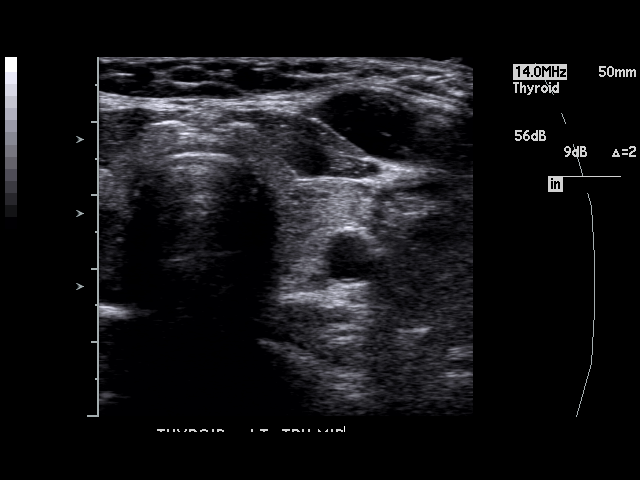
[im 25/33]
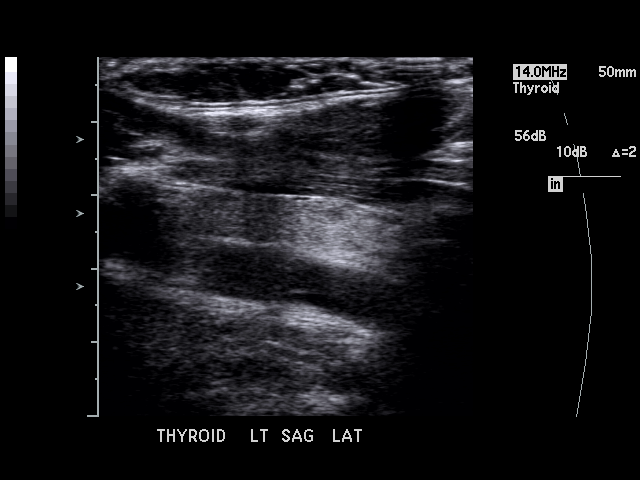
[im 26/33]
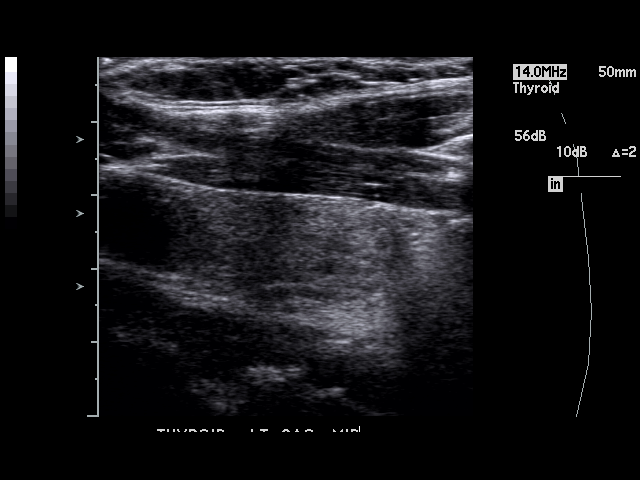
[im 29/33]
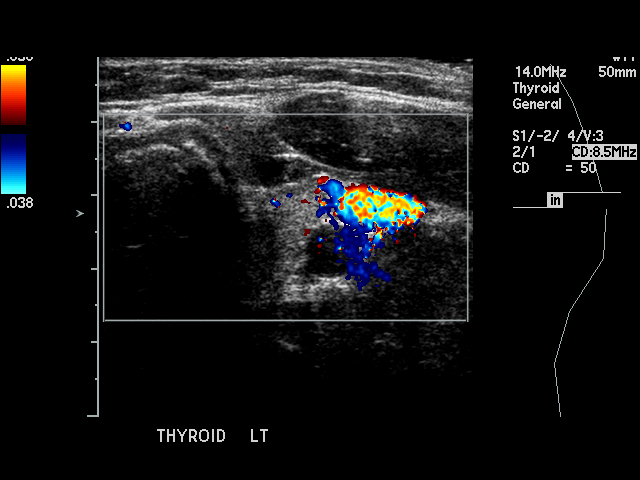
[im 30/33]
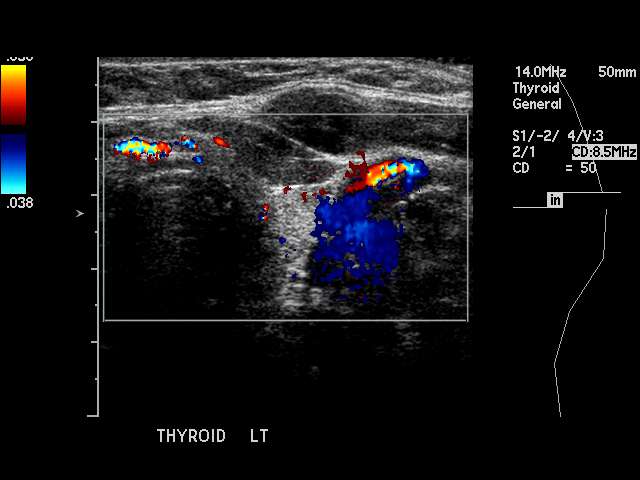
[im 33/33]
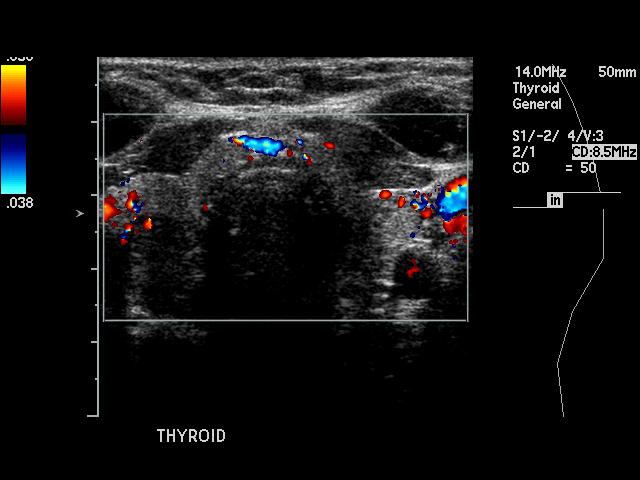

[17 of 25 positions shown; findings below may reference images not displayed]

PROCEDURE:     US  - US THYROID  - October 18, 2006 [DATE]

RESULT:     Sonographic evaluation of the thyroid demonstrates the RIGHT
lobe measures 4.6 x 2.28 x 1.49 cm and the LEFT lobes measures 4.61 x 1.48 x
1.26 cm.  There are areas of nodularity in both lobes.  In the inferior
aspect on the LEFT there is a solid appearing nodule of 6.3 x 4.5 x 4.5 mm.
On the RIGHT there is a cystic superior RIGHT lobe nodular density of 3.5 x
3.3 x 3.5 mm.  There is a complex predominantly solid inferior pole RIGHT
lobe mass of 1.8 x 1.8 x 1.2 cm
IMPRESSION: 1)Multinodular appearance of the thyroid.  The smallest nodule is cystic.
Solid nodules are seen inferiorly in both lobes. Statistically, the findings
are most likely secondary to multinodular goiter. Continued close follow-up
is recommended to document stability.  No definite evidence of malignancy is
identified sonographically.

## 2008-10-22 IMAGING — CR DG CLAVICLE*R*
1 series · 2 of 2 positions shown · non-contrast
Comparison: none

REASON FOR EXAM: Right clavicle change over time
COMMENTS:

PROCEDURE:     DXR - DXR CLAVICLE RIGHT  - October 18, 2006 [DATE]
RESULT:        Views of the RIGHT clavicle demonstrates no evidence of
fracture, dislocation or radiopaque foreign body.

[Series 1: view not recorded · 0.17mm/px · 2 of 2 slices shown]
[im 1/2]
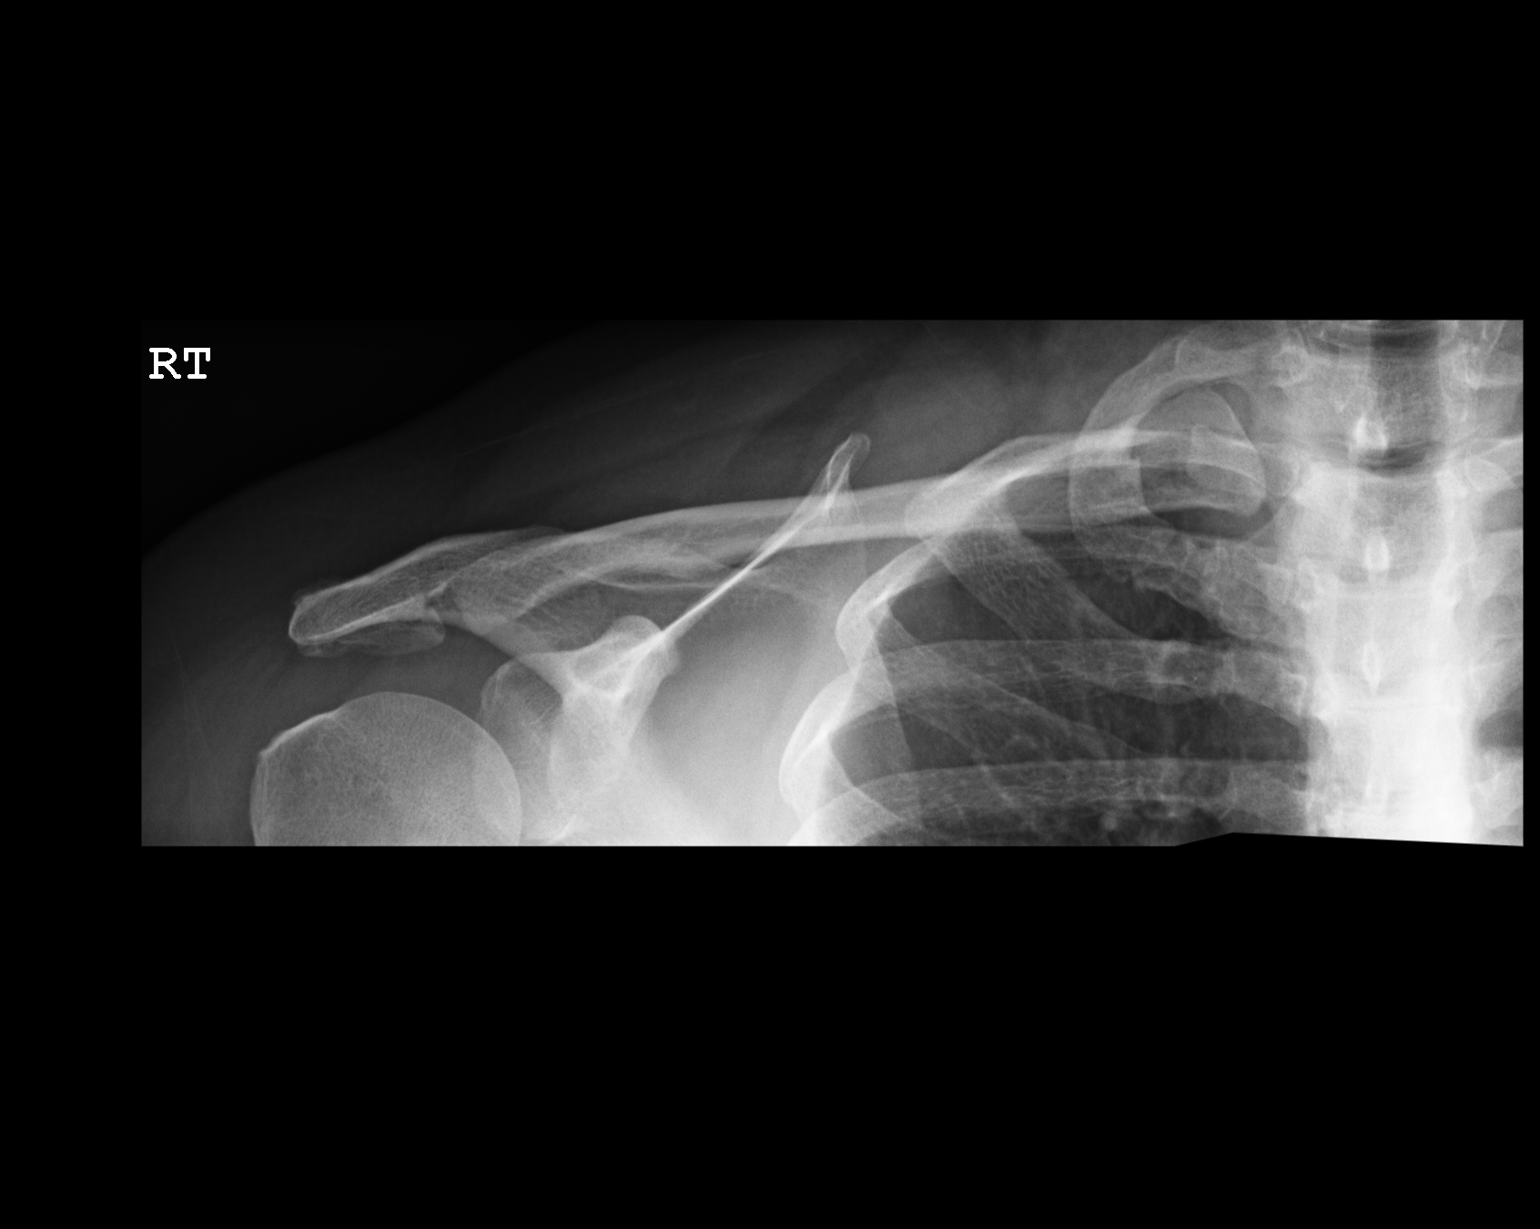
[im 2/2]
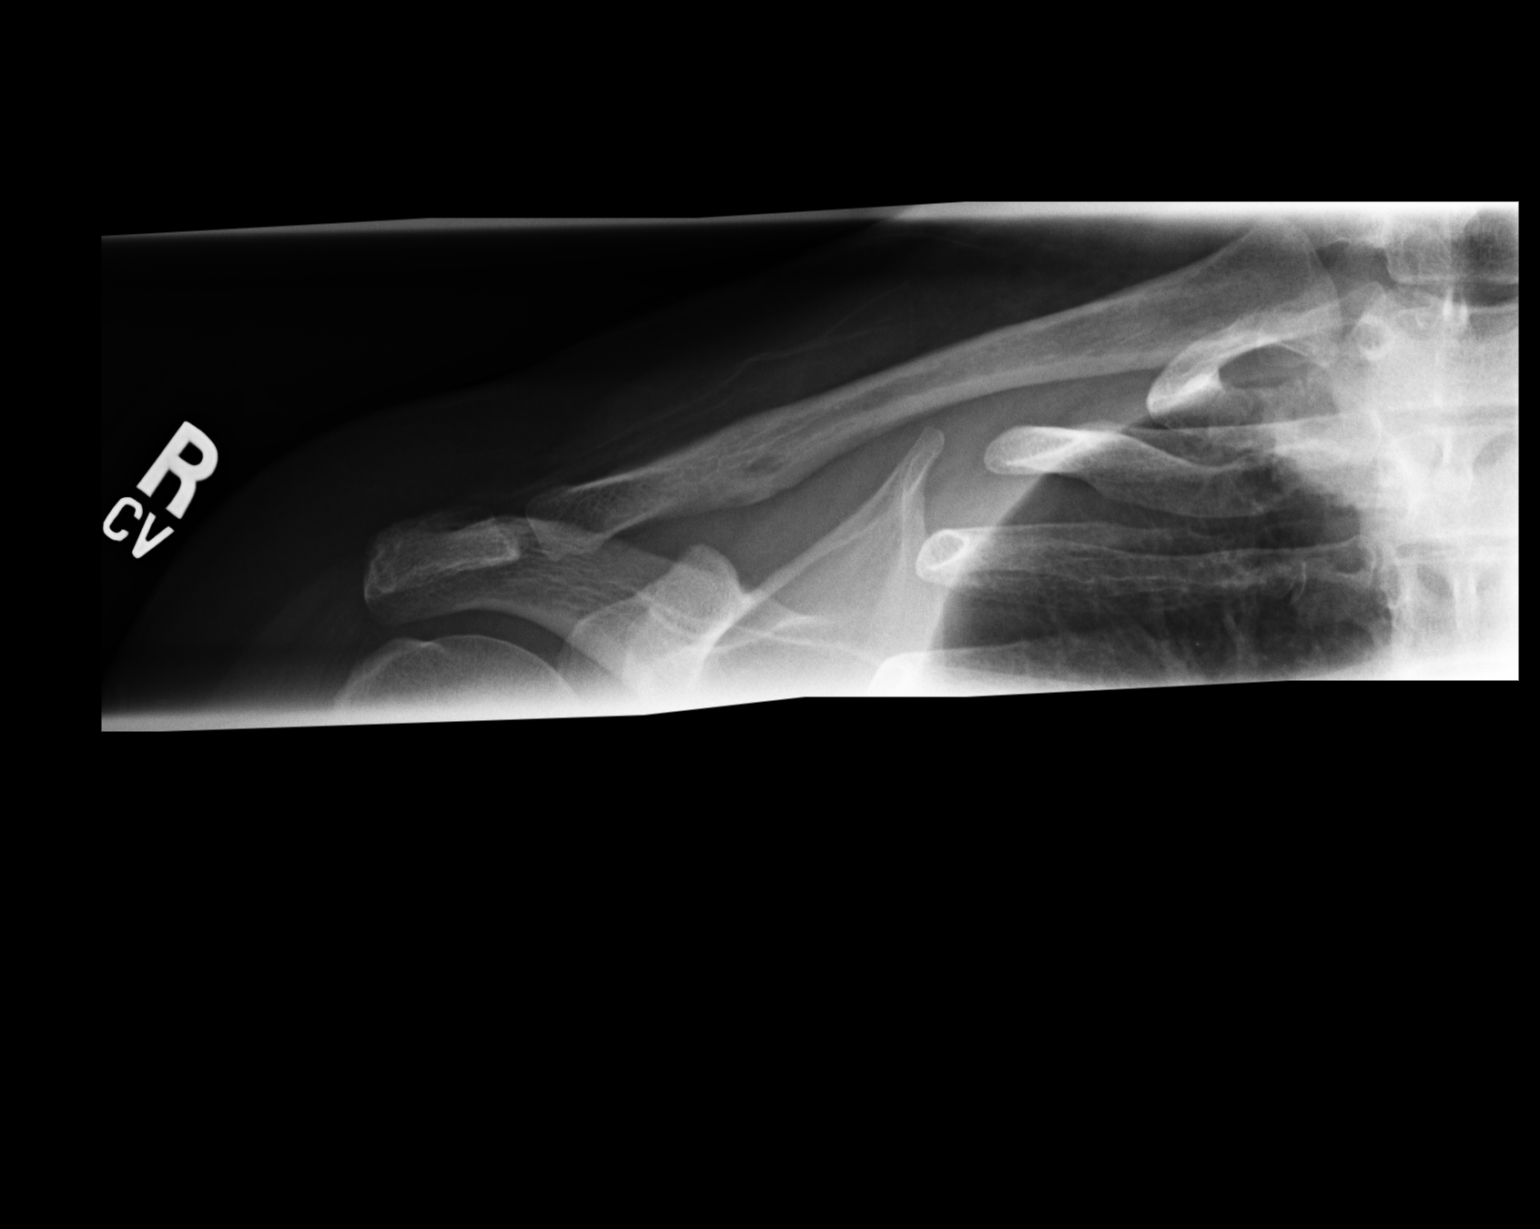

[2 of 2 positions shown; findings below may reference images not displayed]

IMPRESSION: See above.

## 2008-11-29 IMAGING — NM NM THYROID IMAGING W/ UPTAKE SINGLE (24 HR)
1 series · 3 of 3 positions shown · non-contrast
Comparison: none

REASON FOR EXAM: Nodular goiter
COMMENTS:

[Series 1: (id) thyroid scan · 2.40mm/px · 3 of 3 slices shown]
[im 1/3  full-range]
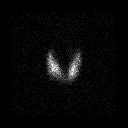
[im 2/3  full-range]
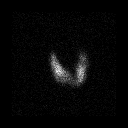
[im 3/3  full-range]
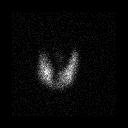

[3 of 3 positions shown; findings below may reference images not displayed]

PROCEDURE:     NM  - NM THYROID Z-AZ3 24 HR [DATE]  [DATE]

RESULT:       The patient has a clinically enlarged thyroid gland.   The
patient received 141 mCi of Z-AZ3 for this study.  The 6-hour uptake is 8.1%
and the 24-hour uptake is 13.5%.  The scan reveals a subtle area of
decreased uptake in the upper pole of the LEFT thyroid lobe.
IMPRESSION: The uptake of the Z-AZ3 is at the lower range of normal on both 6-hour and
24-hour uptake studies.  There is an area of decreased uptake noted in the
upper pole of the LEFT thyroid lobe.  This is conspicuous on the RAO view
only while on the AP view and the LAO views this area of decreased uptake is
not as conspicuous.  On an ultrasound performed in October 2006 there was
no nodule described in the upper pole of the LEFT lobe.

## 2008-12-12 ENCOUNTER — Emergency Department: Payer: Self-pay | Admitting: Emergency Medicine

## 2008-12-17 HISTORY — PX: ROUX-EN-Y GASTRIC BYPASS: SHX1104

## 2008-12-18 ENCOUNTER — Emergency Department: Payer: Self-pay | Admitting: Emergency Medicine

## 2008-12-30 ENCOUNTER — Ambulatory Visit: Payer: Self-pay | Admitting: Obstetrics and Gynecology

## 2009-03-27 IMAGING — CT CT HEAD WITHOUT CONTRAST
2 series · 16 of 30 positions shown, 20 images · non-contrast
Comparison: none

REASON FOR EXAM: AMS 19
COMMENTS:

[Series 2: without · axial · non-contrast · 0.42mm/px · z∈[+427,+547]mm · 13 of 29 slices shown, 17 images]
[im 3/29  brain]
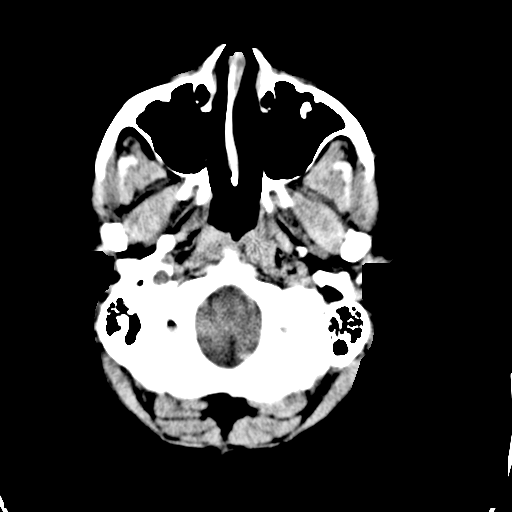
[im 3/29  bone]
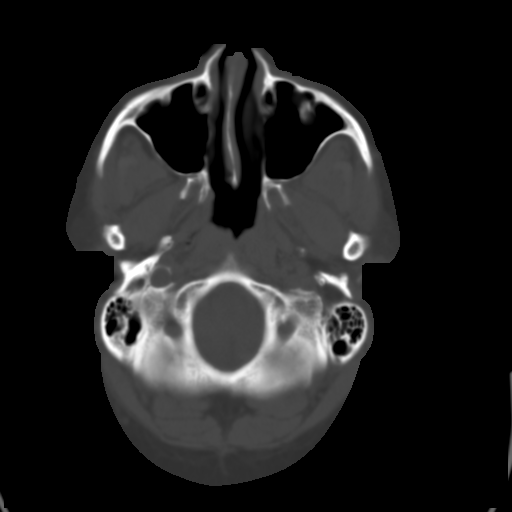
[im 5/29  brain]
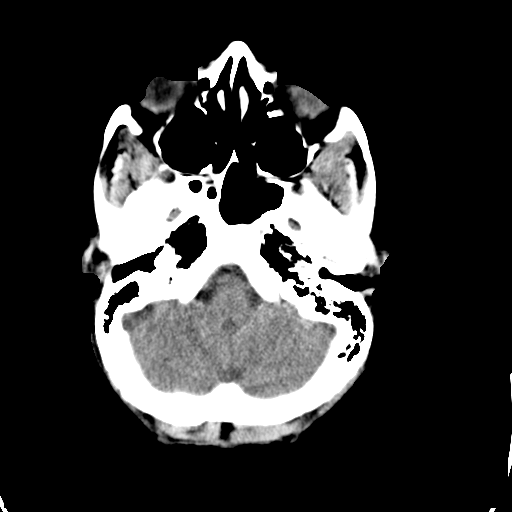
[im 7/29  brain]
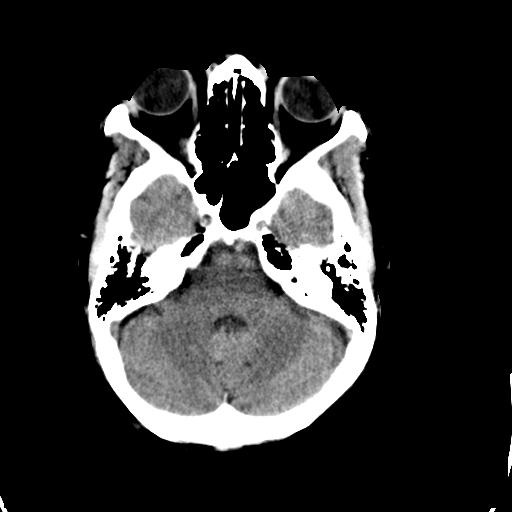
[im 9/29  brain]
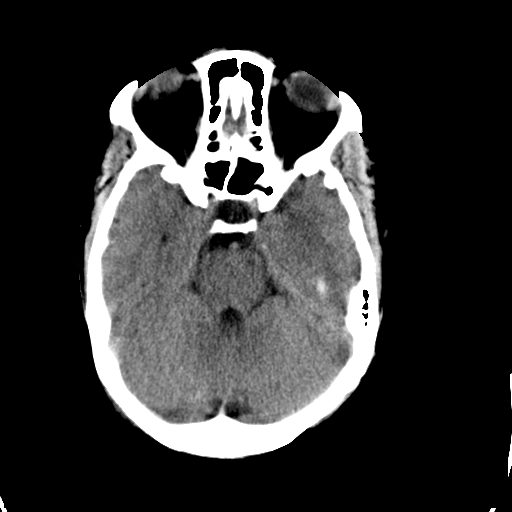
[im 11/29  brain]
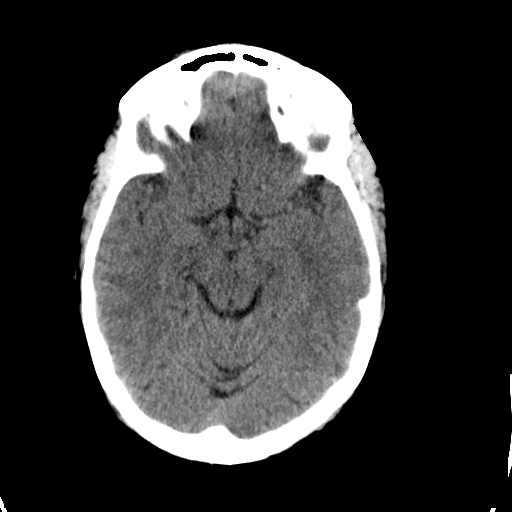
[im 11/29  bone]
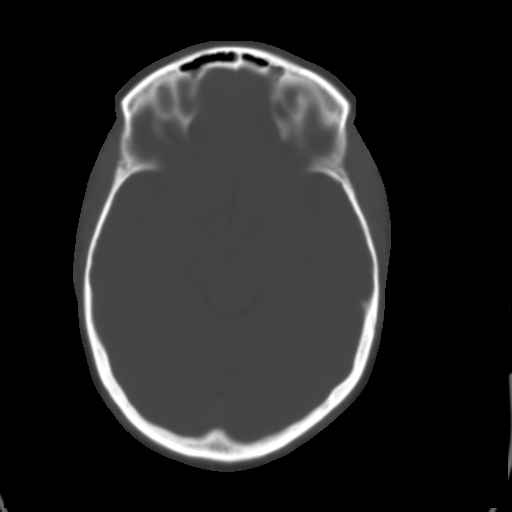
[im 13/29  brain]
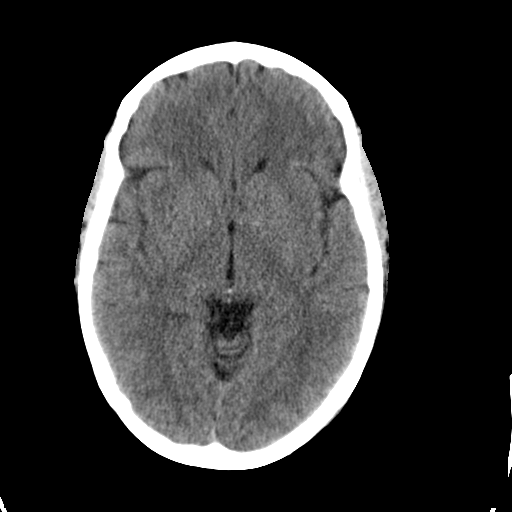
[im 15/29  brain]
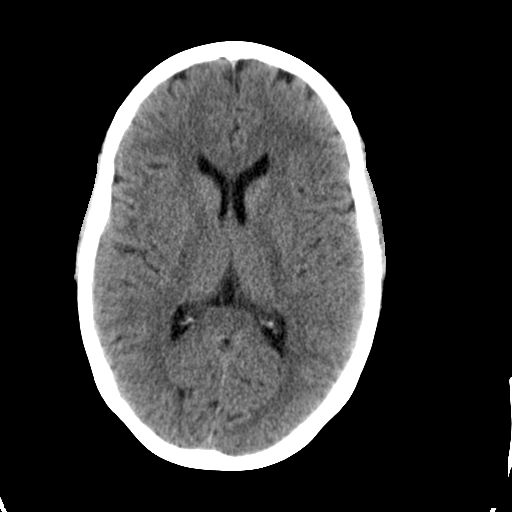
[im 17/29  brain]
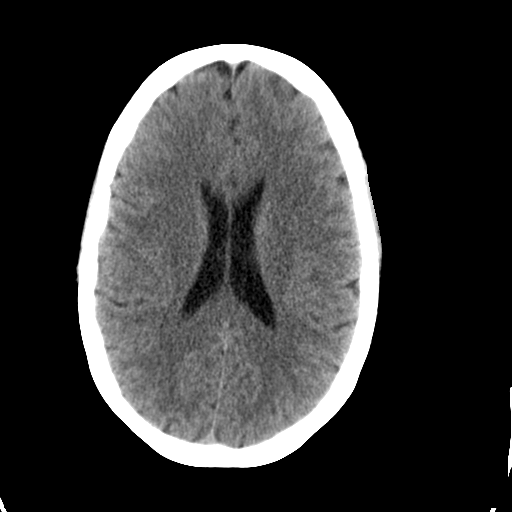
[im 19/29  brain]
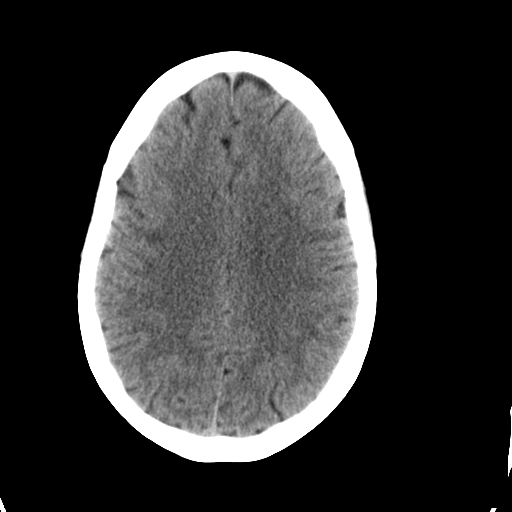
[im 19/29  bone]
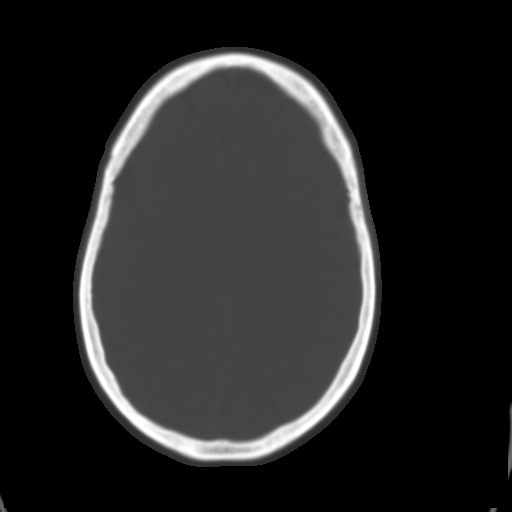
[im 21/29  brain]
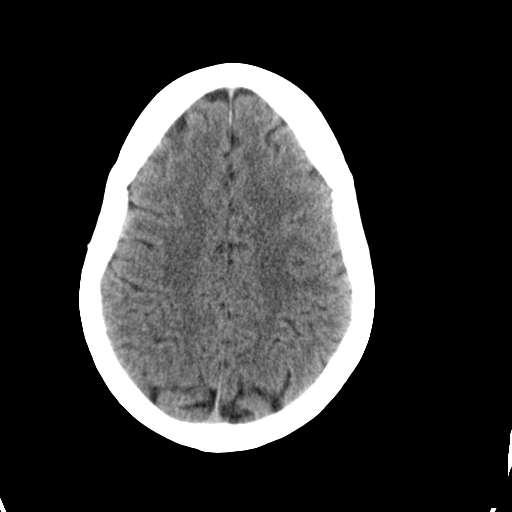
[im 23/29  brain]
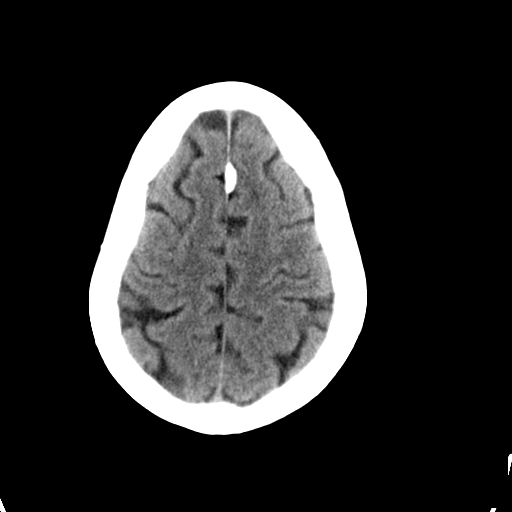
[im 25/29  brain]
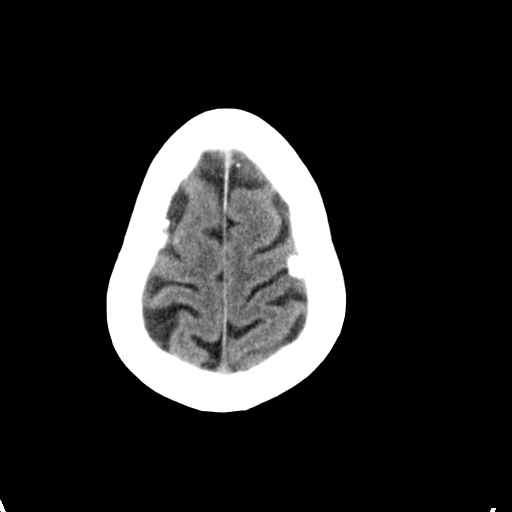
[im 27/29  brain]
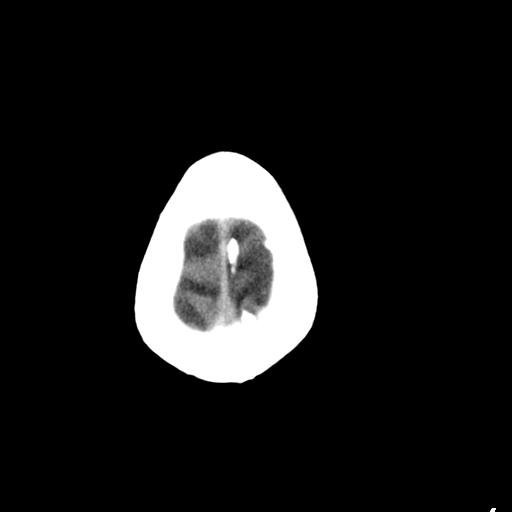
[im 27/29  bone]
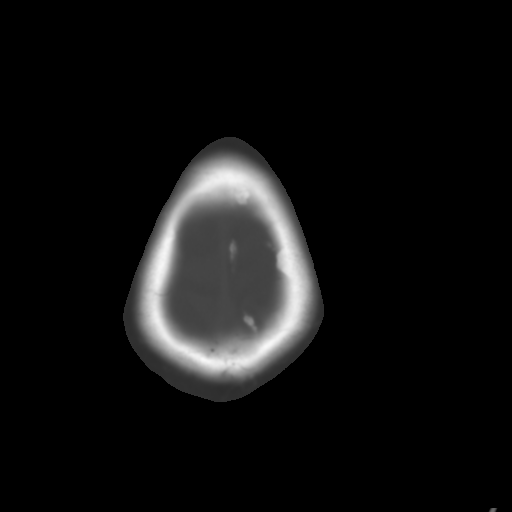

[Series 3: bone · axial · 0.42mm/px · z∈[+427,+467]mm · 3 of 29 slices shown]
[im 3/29  bone]
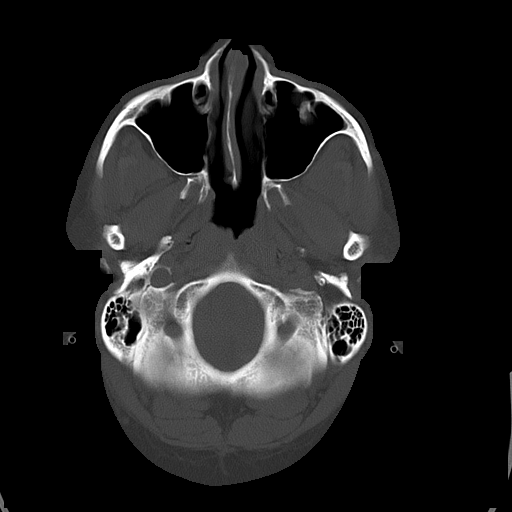
[im 7/29  bone]
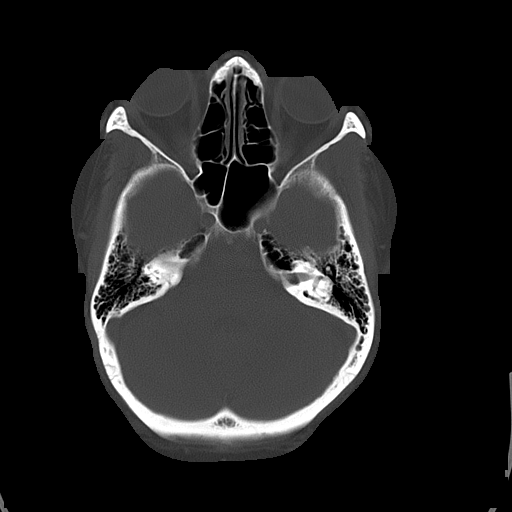
[im 11/29  bone]
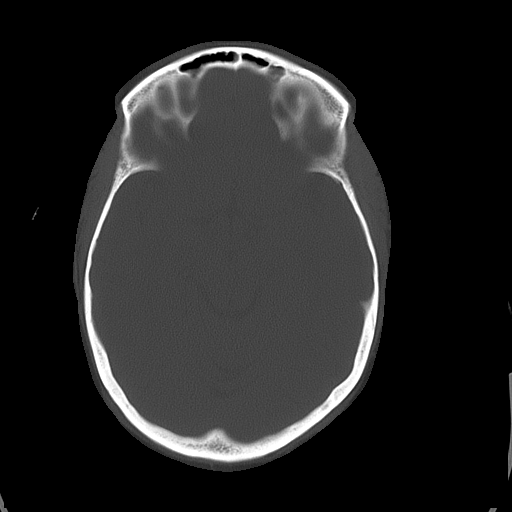

[16 of 30 positions shown; findings below may reference images not displayed]

PROCEDURE:     CT  - CT HEAD WITHOUT CONTRAST  - March 23, 2007  [DATE]

RESULT:     A emergent noncontrast CT of the brain is performed. The patient
has no prior study for comparison. The ventricles and sulci are normal.
There is no hemorrhage. There is no focal mass, mass-effect or midline
shift. Is no evidence of edema or territorial infarct. The bone windows
demonstrate normal aeration of the paranasal sinuses and mastoid air cells.
There is no skull fracture demonstrated.
IMPRESSION: 1. No acute intracranial abnormality.

## 2009-05-01 ENCOUNTER — Emergency Department: Payer: Self-pay | Admitting: Emergency Medicine

## 2009-06-06 IMAGING — US US EXTREM LOW VENOUS*L*
1 series · 18 of 24 positions shown · non-contrast
Comparison: none

REASON FOR EXAM: Pain, swelling
COMMENTS:

PROCEDURE:     US  - US DOPPLER LOW EXTR LEFT  - June 02, 2007  [DATE]
RESULT:     LEFT lower extremity color flow duplex Doppler examination
reveals no evidence of deep venous thrombosis. Color flow analysis and
Doppler analysis are normal.

[Series 1: us extrem low venous*left* · 18 of 25 slices shown]
[im 1/25]
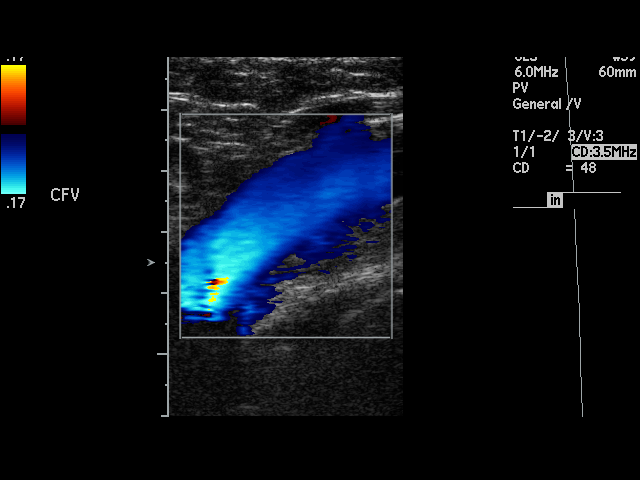
[im 3/25]
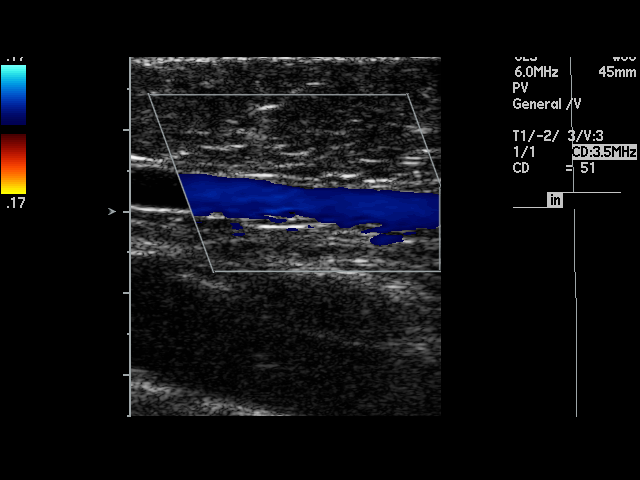
[im 4/25]
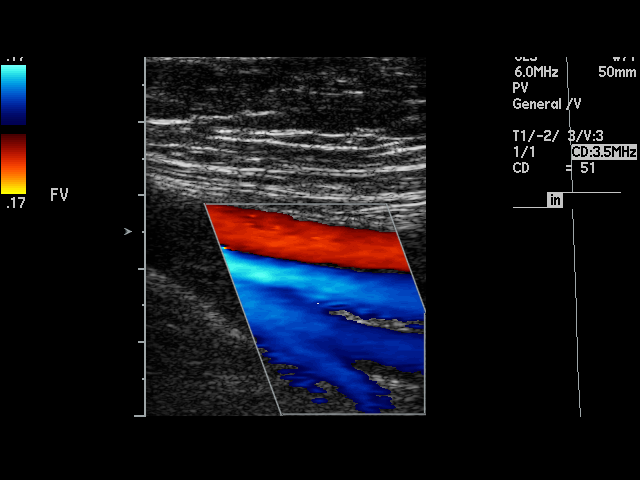
[im 5/25]
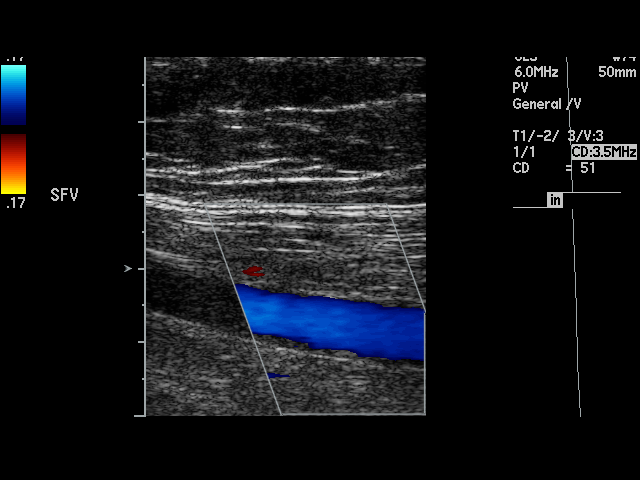
[im 7/25]
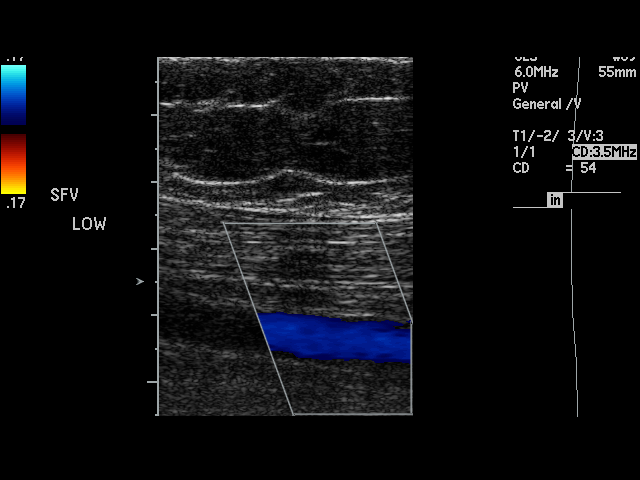
[im 8/25]
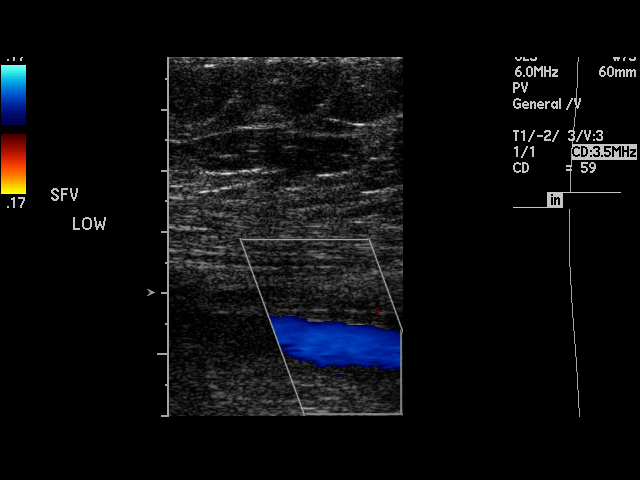
[im 9/25]
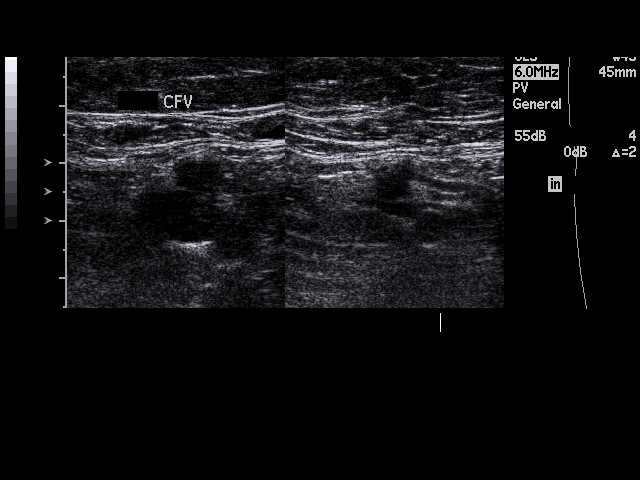
[im 11/25]
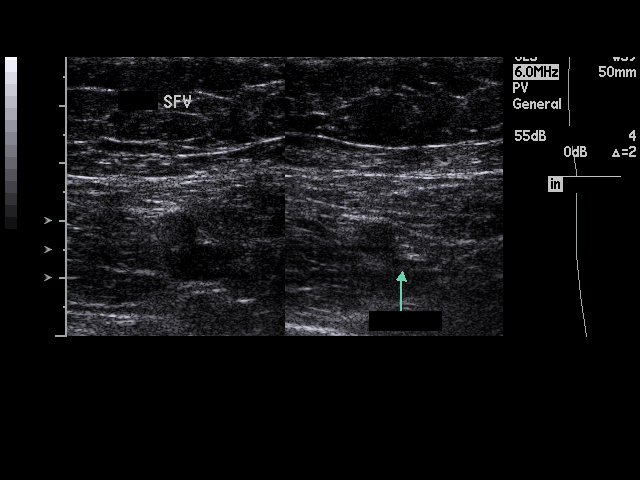
[im 12/25]
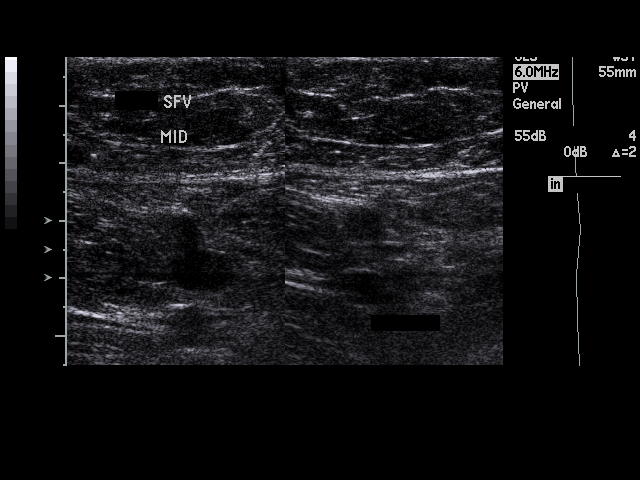
[im 13/25]
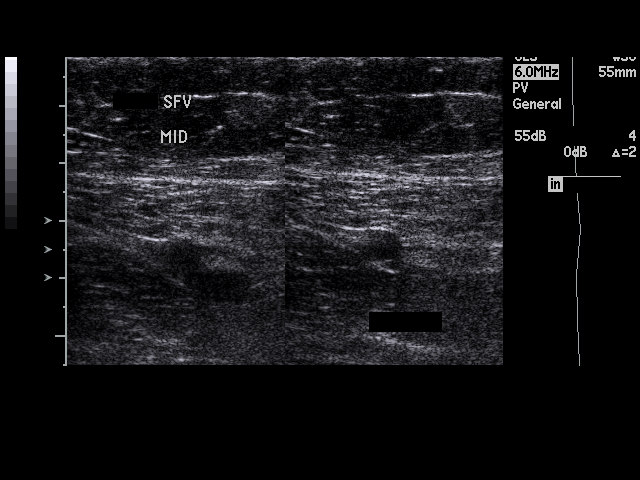
[im 15/25]
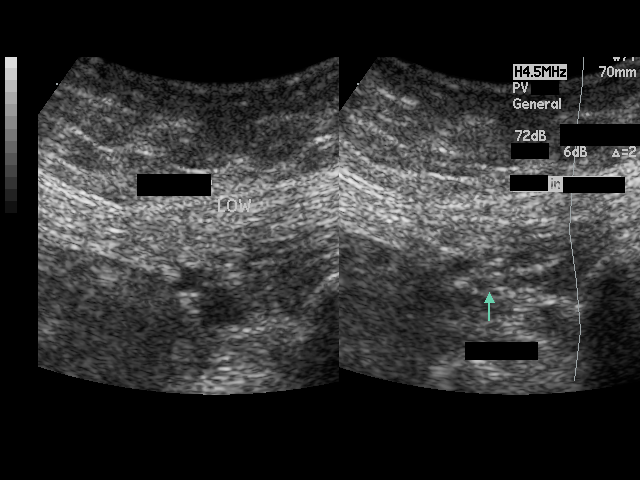
[im 16/25]
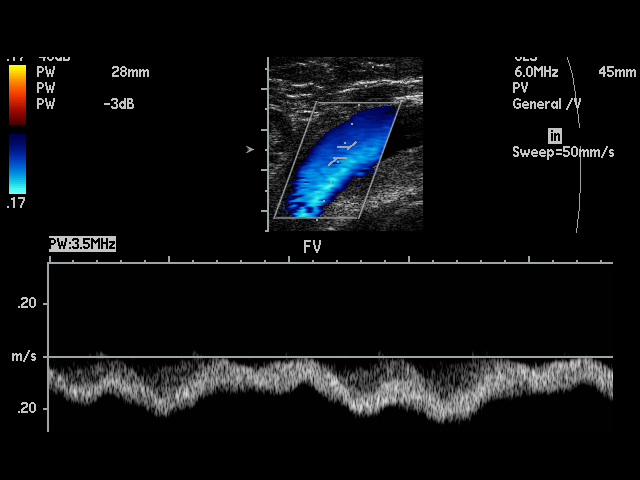
[im 17/25]
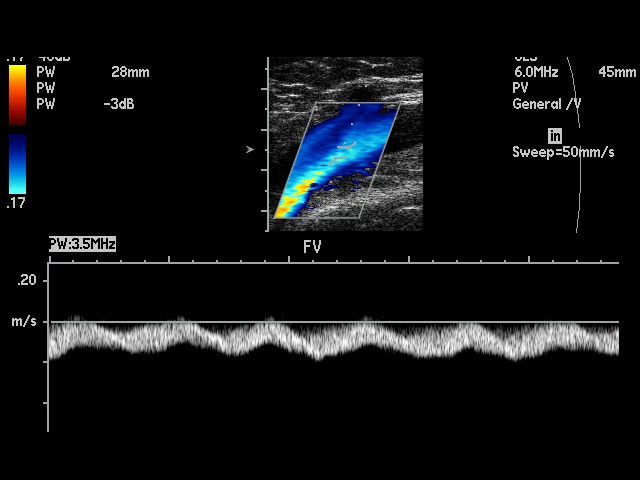
[im 19/25]
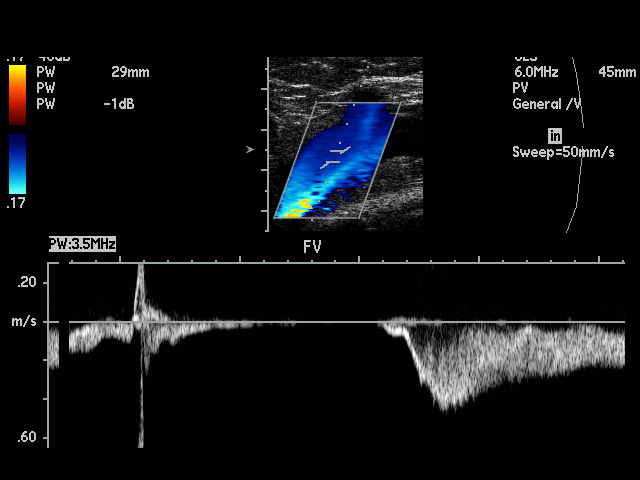
[im 20/25]
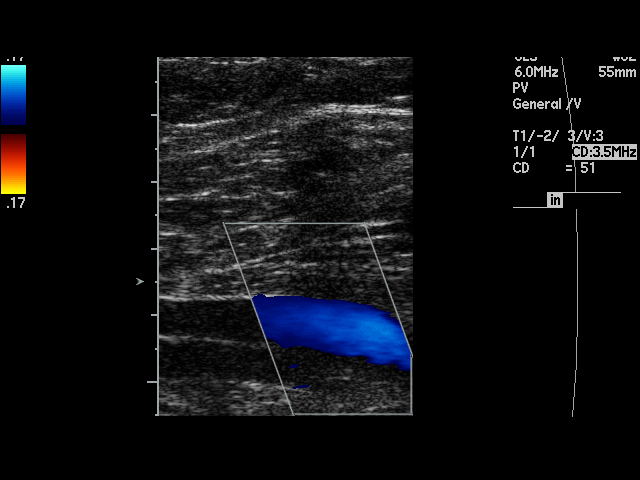
[im 21/25]
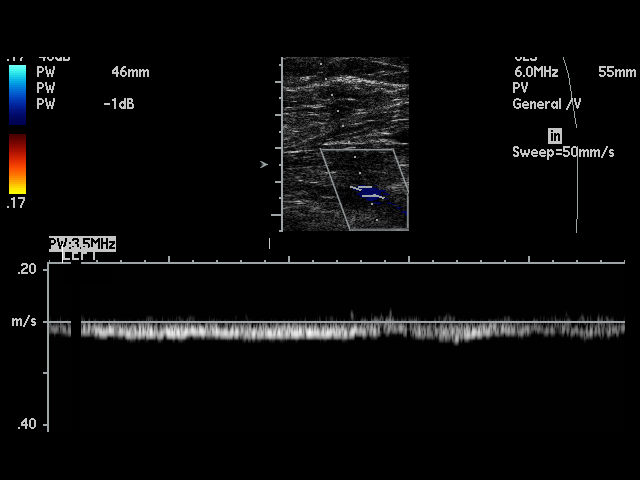
[im 23/25]
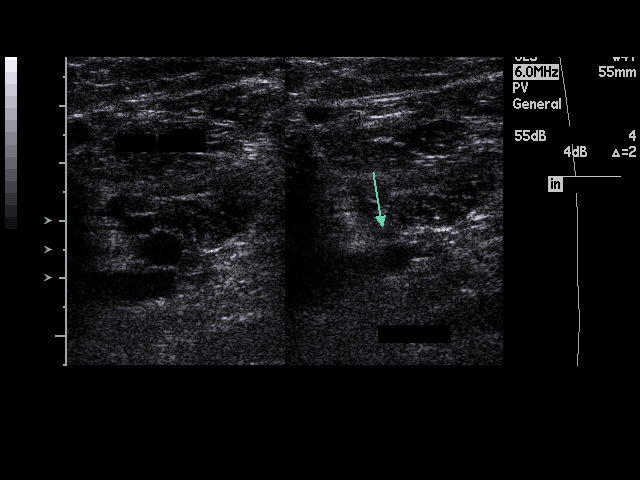
[im 25/25]
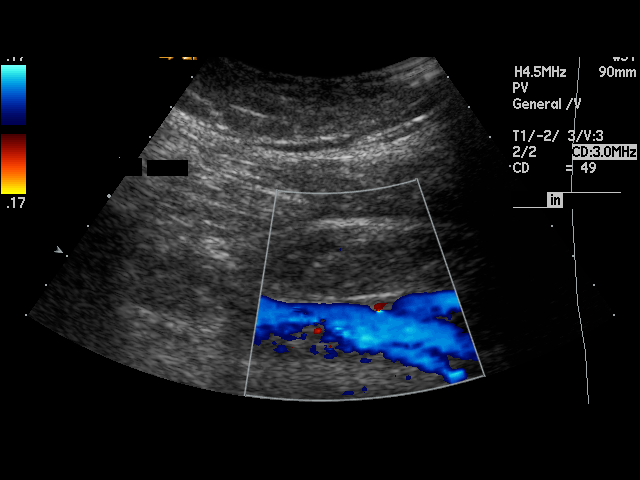

[18 of 24 positions shown; findings below may reference images not displayed]

IMPRESSION: No acute abnormality is identified.

## 2009-07-25 DIAGNOSIS — M9979 Connective tissue and disc stenosis of intervertebral foramina of abdomen and other regions: Secondary | ICD-10-CM | POA: Insufficient documentation

## 2009-07-25 DIAGNOSIS — I10 Essential (primary) hypertension: Secondary | ICD-10-CM | POA: Insufficient documentation

## 2009-07-25 DIAGNOSIS — E119 Type 2 diabetes mellitus without complications: Secondary | ICD-10-CM | POA: Insufficient documentation

## 2009-07-25 DIAGNOSIS — E1122 Type 2 diabetes mellitus with diabetic chronic kidney disease: Secondary | ICD-10-CM | POA: Insufficient documentation

## 2009-08-28 ENCOUNTER — Observation Stay (HOSPITAL_COMMUNITY): Admission: EM | Admit: 2009-08-28 | Discharge: 2009-08-29 | Payer: Self-pay | Admitting: Emergency Medicine

## 2009-10-10 IMAGING — CR DG CHEST 1V PORT
1 series · 1 of 1 positions shown · non-contrast
Comparison: none

REASON FOR EXAM: Chest pain
COMMENTS:

PROCEDURE:     DXR - DXR PORTABLE CHEST SINGLE VIEW  - October 06, 2007  [DATE]
RESULT:     Frontal view of the chest is performed.
There is no evidence of focal infiltrates, effusions or edema. The cardiac
silhouette is enlarged. The visualized bony skeleton is unremarkable.

[view not recorded]
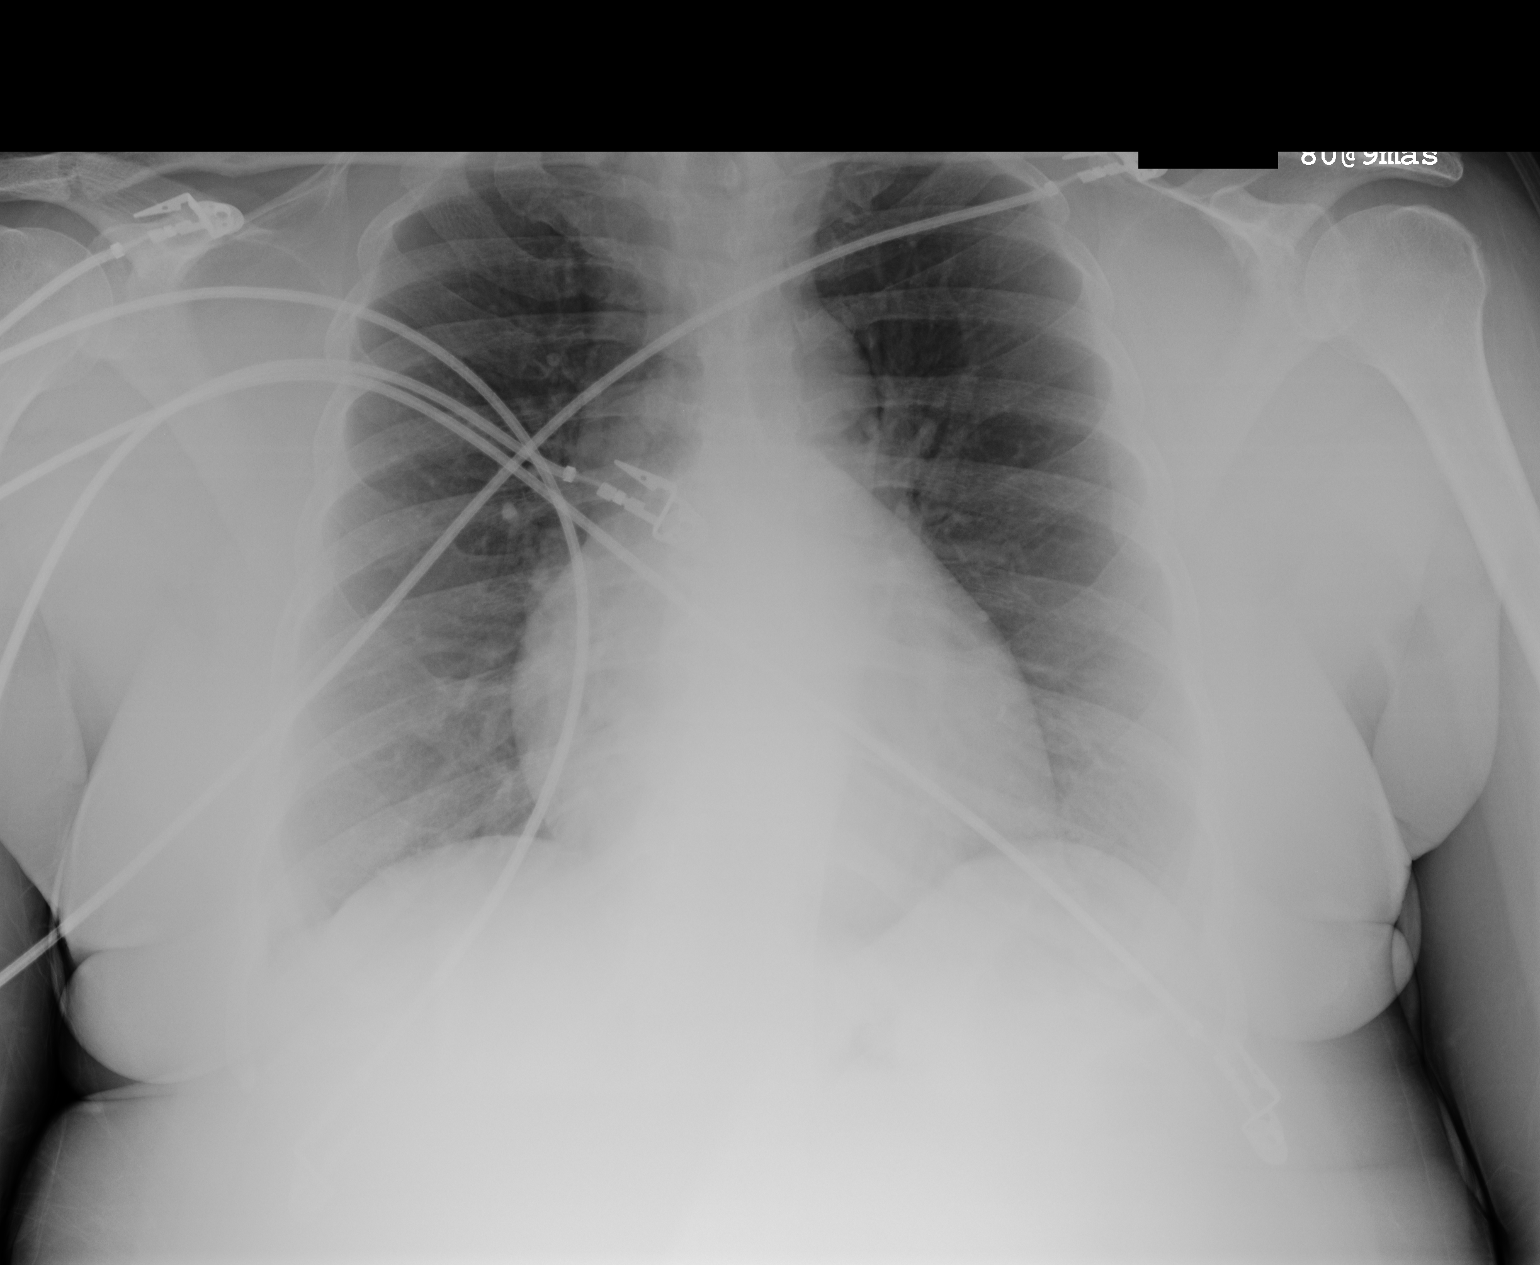

[1 of 1 positions shown; findings below may reference images not displayed]

IMPRESSION: Cardiomegaly without evidence of acute cardiopulmonary
disease.

## 2009-10-12 IMAGING — US ABDOMEN ULTRASOUND
1 series · 17 of 25 positions shown · non-contrast
Comparison: none

REASON FOR EXAM: Cholecystitis
COMMENTS:

[Series 1: abdomen ultrasound · 17 of 42 slices shown]
[im 1/42]
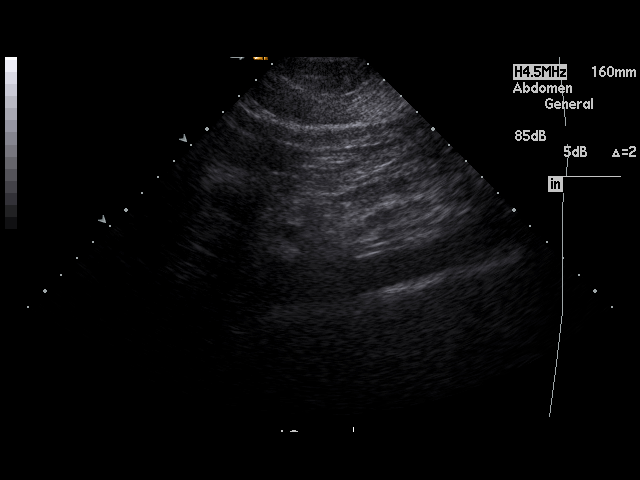
[im 4/42]
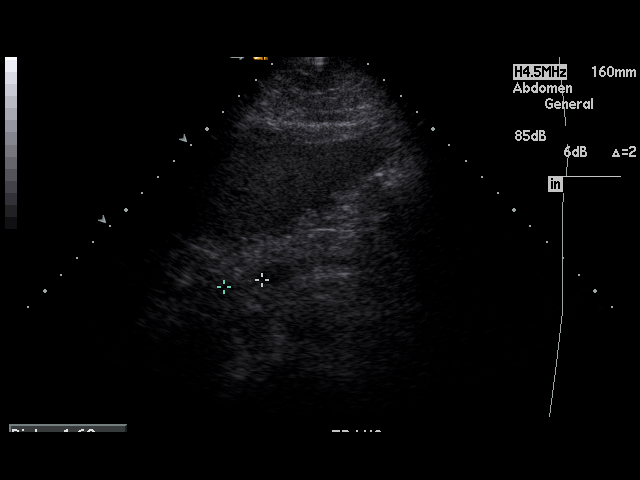
[im 6/42]
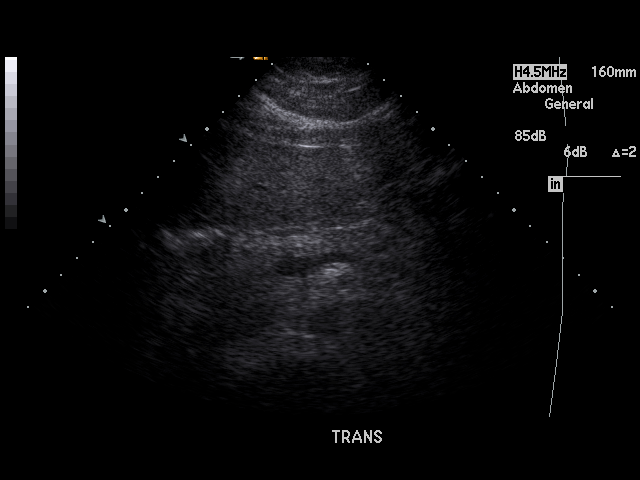
[im 9/42]
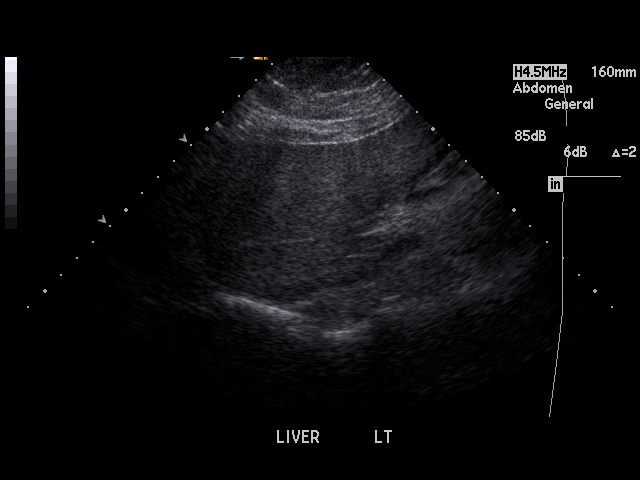
[im 11/42]
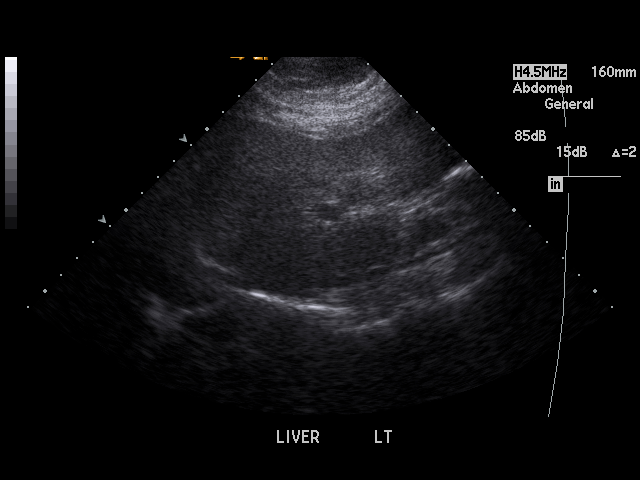
[im 14/42]
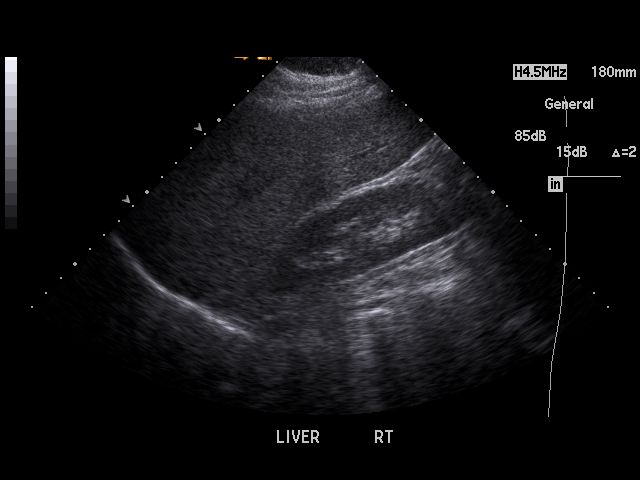
[im 16/42]
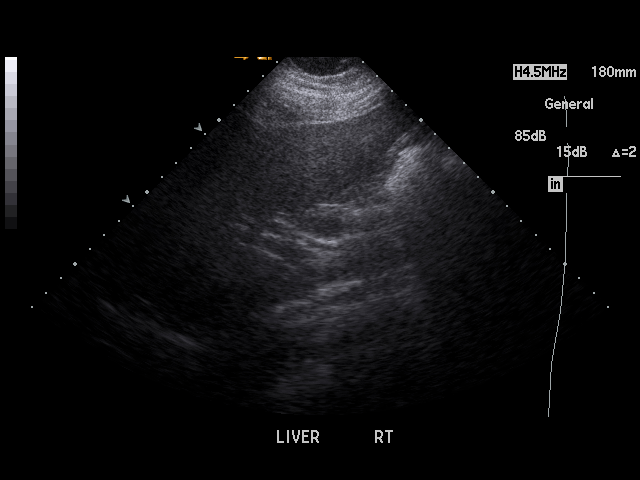
[im 19/42]
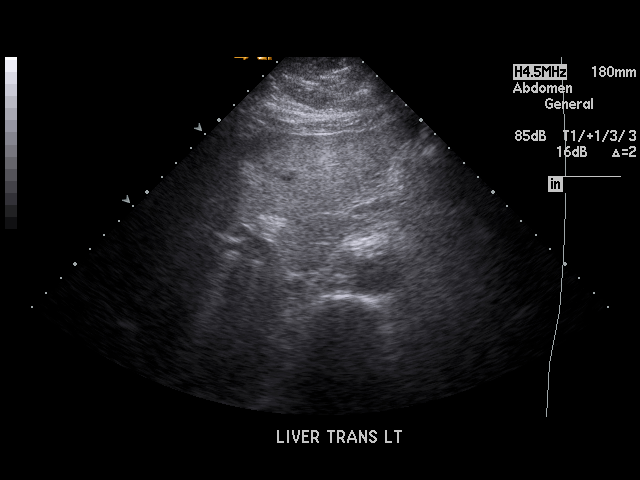
[im 21/42]
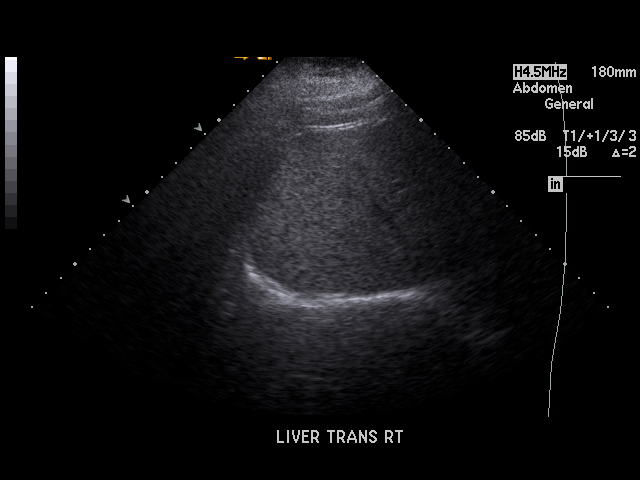
[im 23/42]
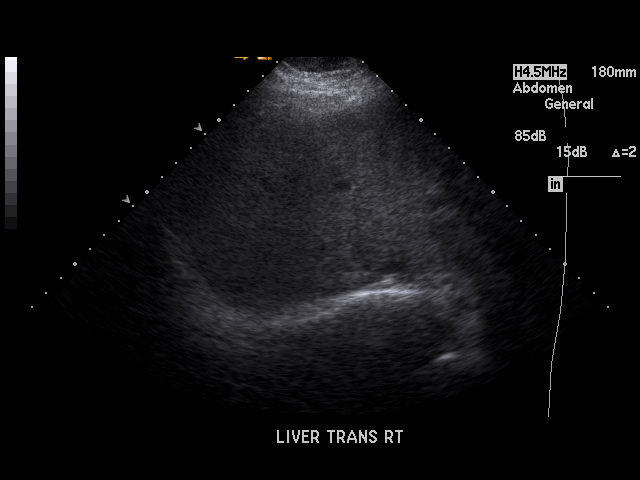
[im 26/42]
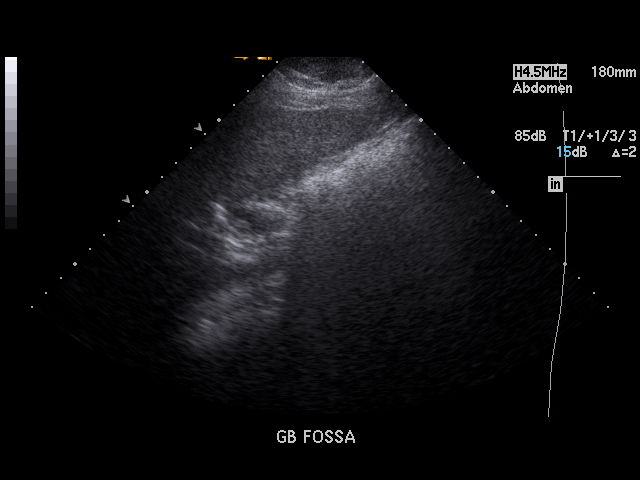
[im 28/42]
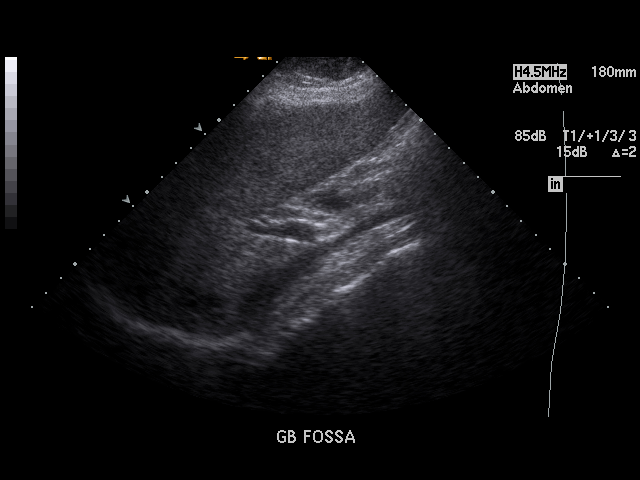
[im 31/42]
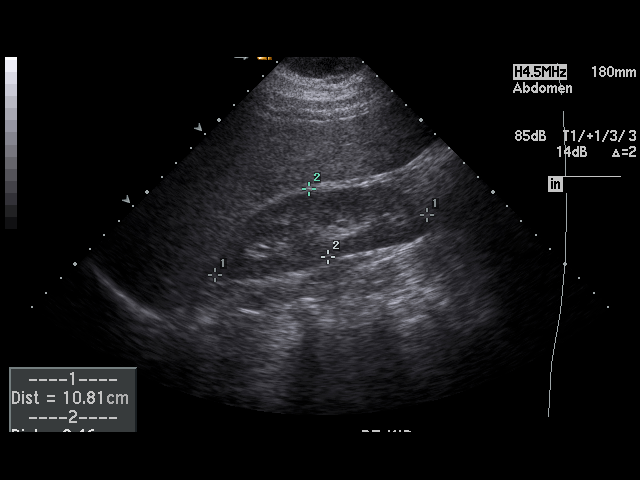
[im 33/42]
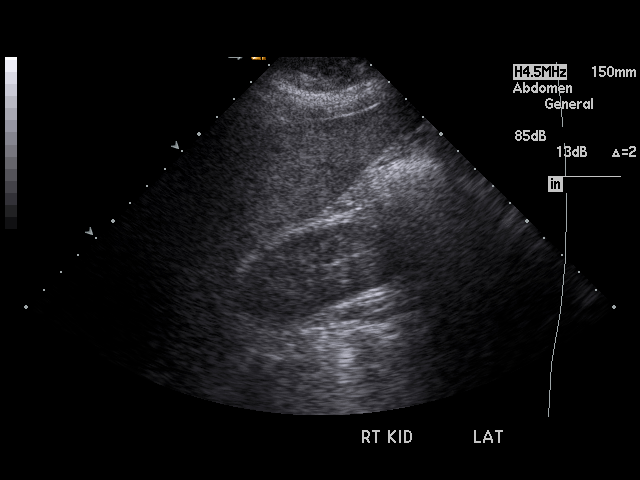
[im 36/42]
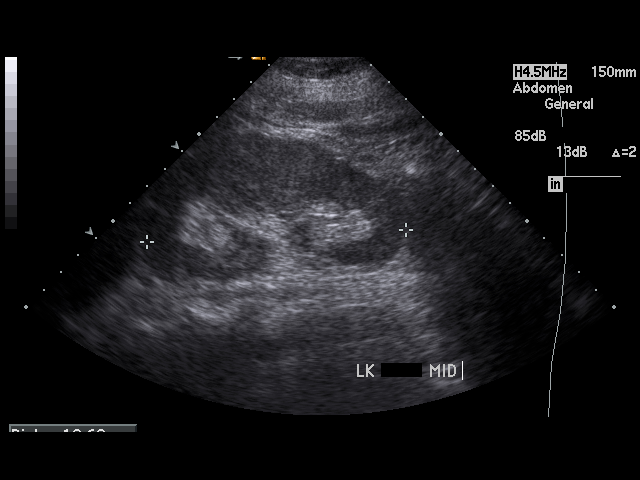
[im 38/42]
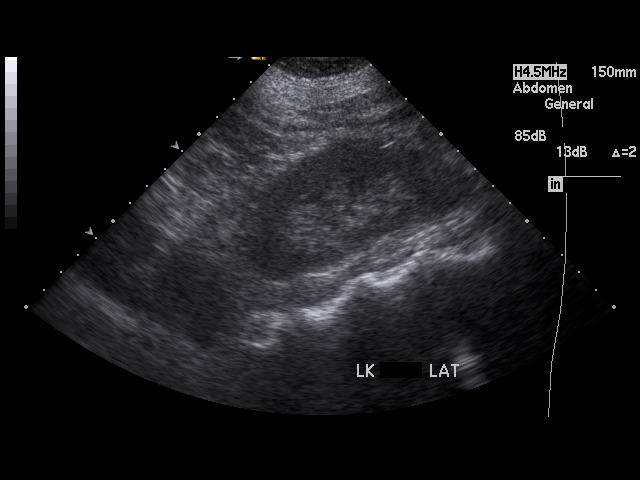
[im 42/42]
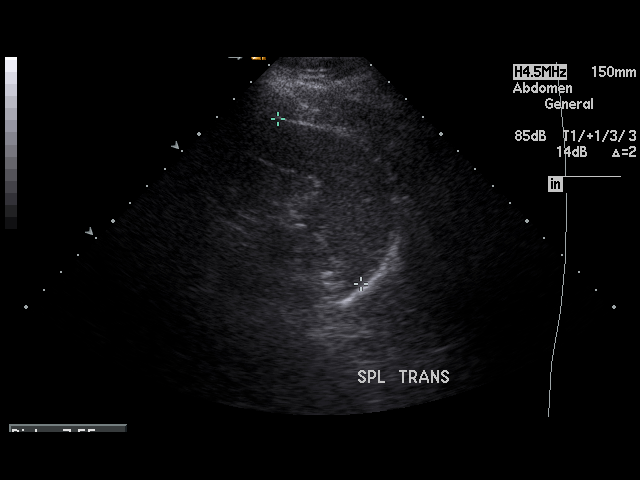

[17 of 25 positions shown; findings below may reference images not displayed]

PROCEDURE:     US  - US ABDOMEN GENERAL SURVEY  - October 08, 2007  [DATE]

RESULT:     The gallbladder is surgically absent. The common bile duct
measures 5.9 mm in diameter. The liver demonstrates mildly increased
echotexture consistent with fatty infiltration. There is no mass or ductal
dilation. Portal venous flow is normal in direction toward the liver. The
pancreas, spleen, abdominal aorta and kidneys are normal in appearance.
There is no evidence of ascites.
IMPRESSION: 1.  The gallbladder is surgically absent.
2.  There are findings consistent with fatty infiltration of the liver.

## 2009-10-24 LAB — TSH: TSH: 2.37 u[IU]/mL (ref <=5.90)

## 2009-10-25 ENCOUNTER — Ambulatory Visit: Payer: Self-pay | Admitting: Family Medicine

## 2009-10-26 ENCOUNTER — Emergency Department: Payer: Self-pay | Admitting: Emergency Medicine

## 2009-11-07 DIAGNOSIS — E559 Vitamin D deficiency, unspecified: Secondary | ICD-10-CM | POA: Insufficient documentation

## 2009-11-21 DIAGNOSIS — E669 Obesity, unspecified: Secondary | ICD-10-CM | POA: Insufficient documentation

## 2010-02-06 ENCOUNTER — Ambulatory Visit (HOSPITAL_COMMUNITY): Admission: RE | Admit: 2010-02-06 | Discharge: 2010-02-06 | Payer: Self-pay | Admitting: General Surgery

## 2010-02-10 ENCOUNTER — Ambulatory Visit (HOSPITAL_COMMUNITY): Admission: RE | Admit: 2010-02-10 | Discharge: 2010-02-10 | Payer: Self-pay | Admitting: General Surgery

## 2010-02-10 IMAGING — CT CT ABD-PELV W/O CM
1 of 2 series · 15 of 32 positions shown, 20 images · non-contrast
Comparison: none

REASON FOR EXAM: (1) left flank pain; (2) left flank pain
COMMENTS:

[Series 2: stone · axial · 0.79mm/px · z∈[-543,-147]mm · 15 of 146 slices shown, 20 images]
[im 7/146  soft-tissue]
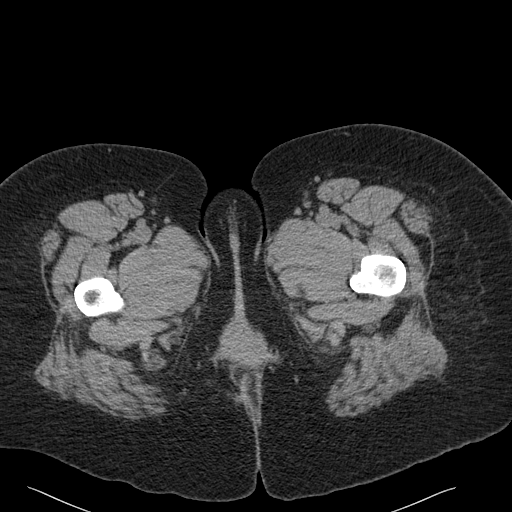
[im 7/146  bone]
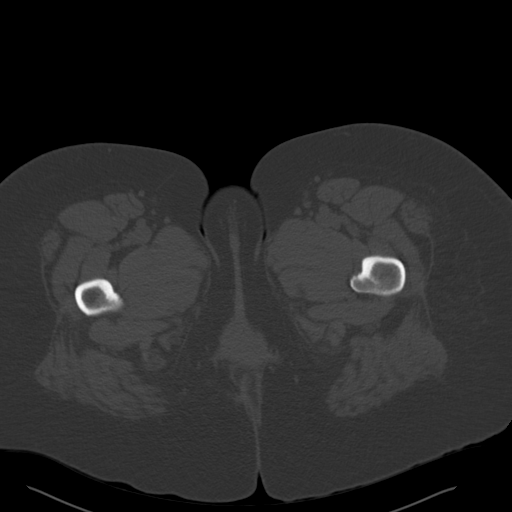
[im 19/146  soft-tissue]
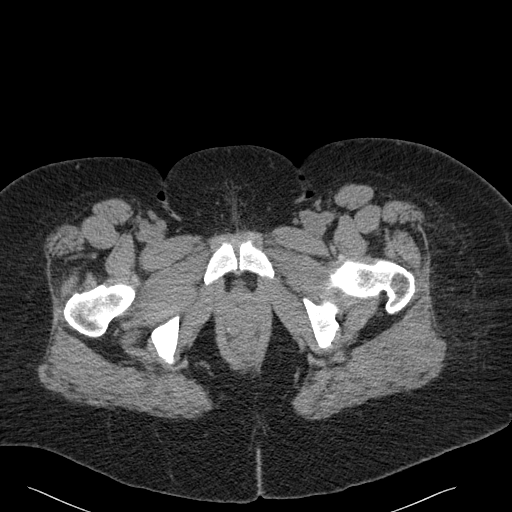
[im 26/146  soft-tissue]
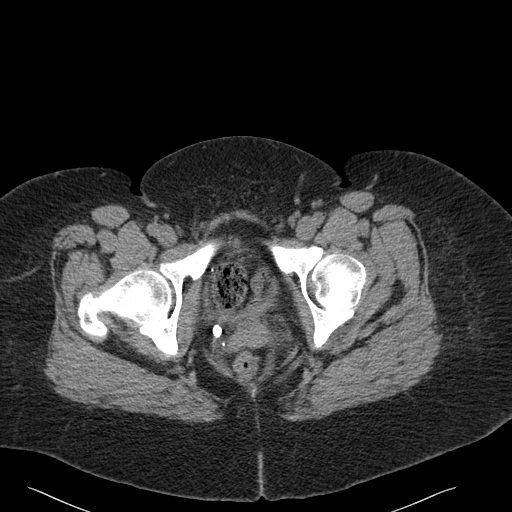
[im 38/146  soft-tissue]
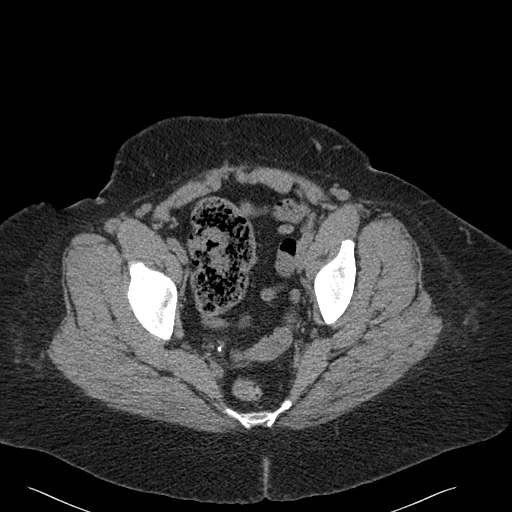
[im 51/146  soft-tissue]
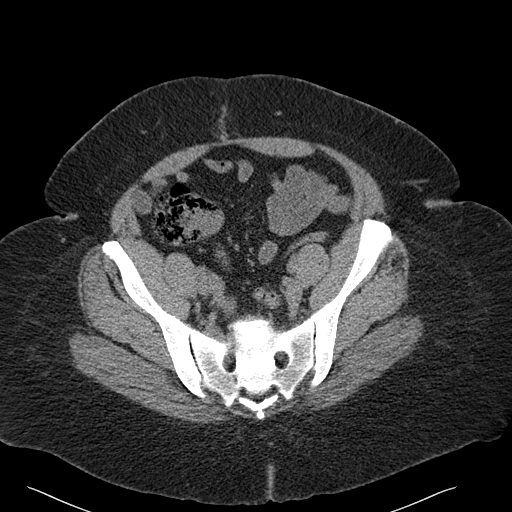
[im 57/146  soft-tissue]
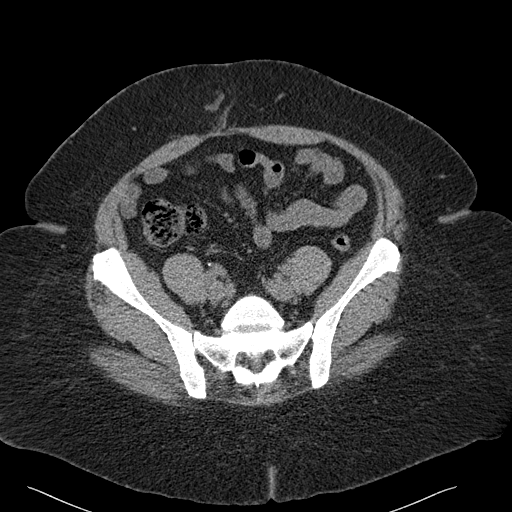
[im 70/146  soft-tissue]
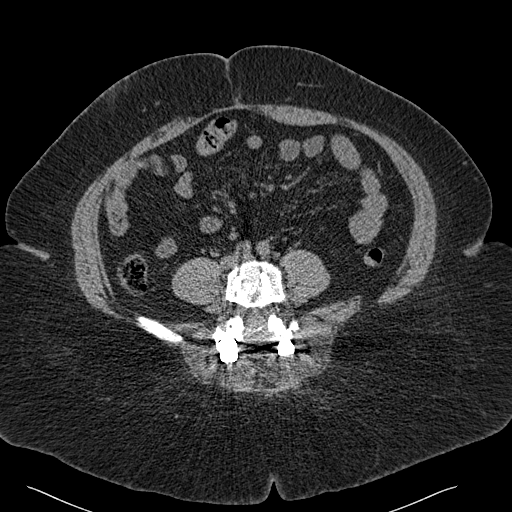
[im 76/146  soft-tissue]
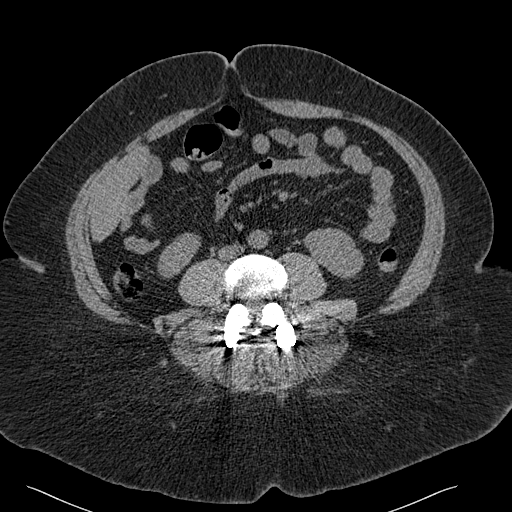
[im 89/146  soft-tissue]
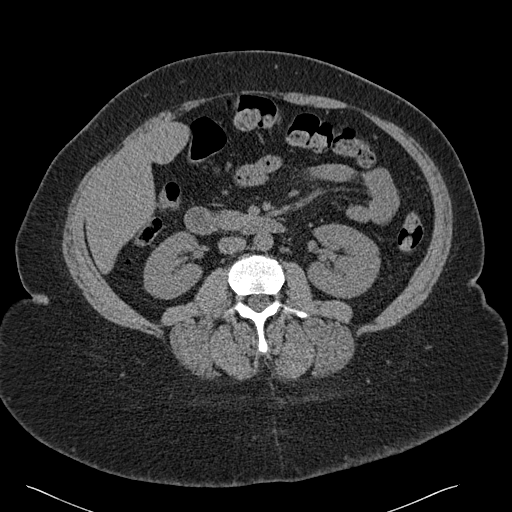
[im 89/146  bone]
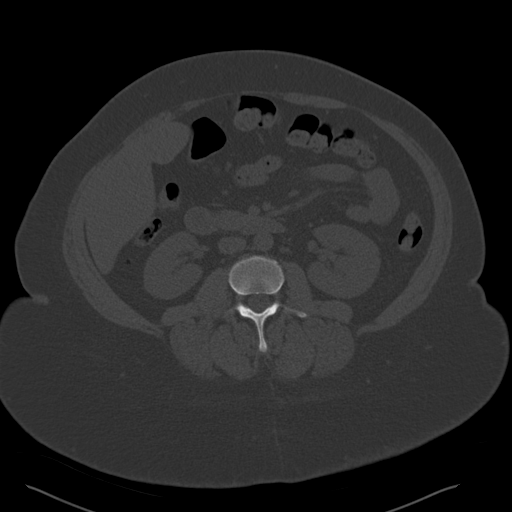
[im 95/146  soft-tissue]
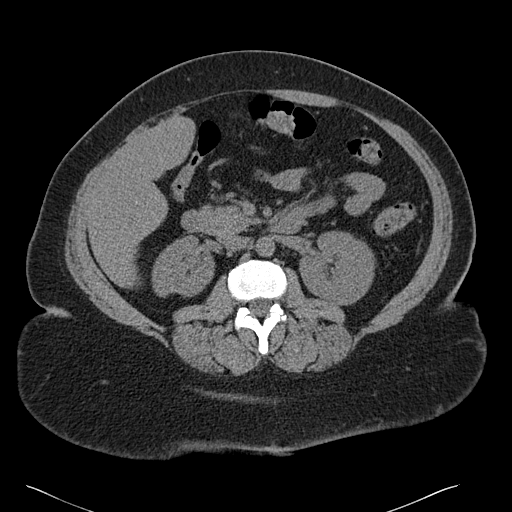
[im 108/146  soft-tissue]
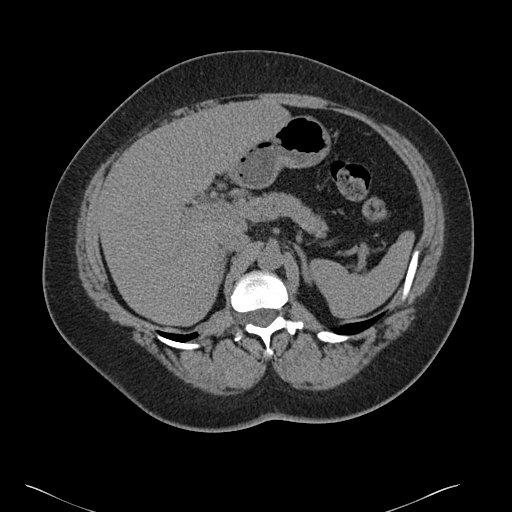
[im 120/146  soft-tissue]
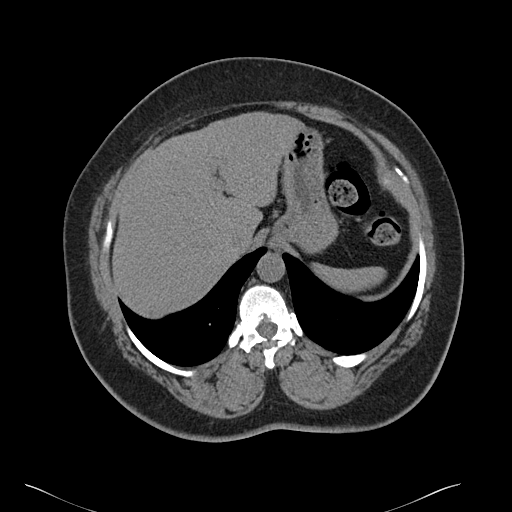
[im 120/146  lung]
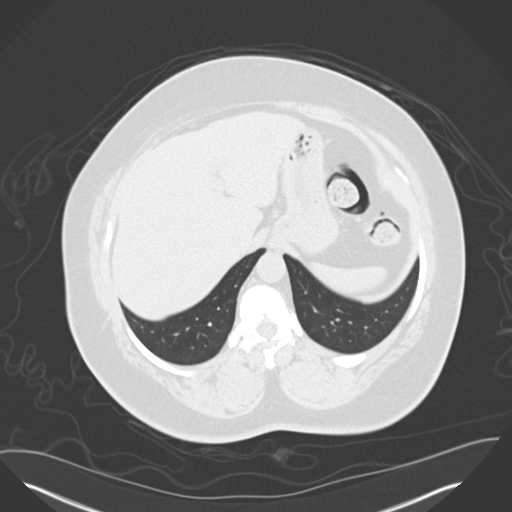
[im 127/146  soft-tissue]
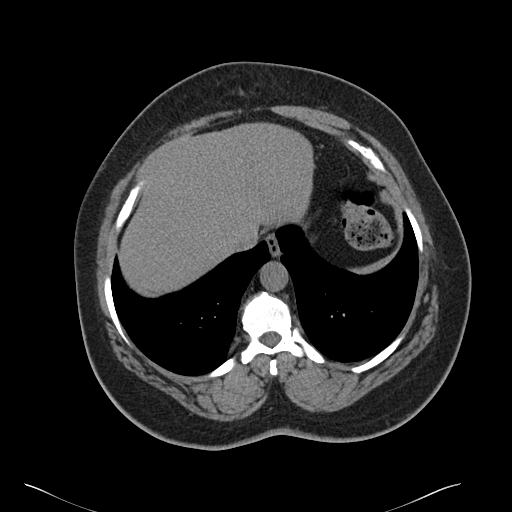
[im 127/146  lung]
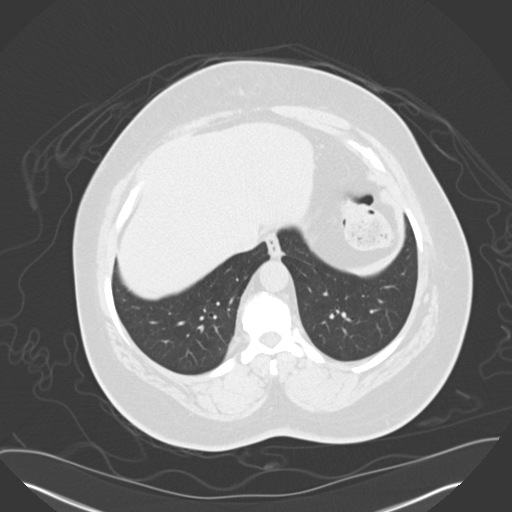
[im 133/146  lung]
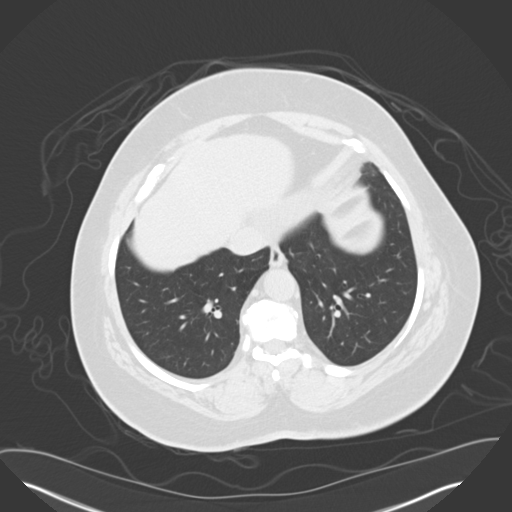
[im 139/146  soft-tissue]
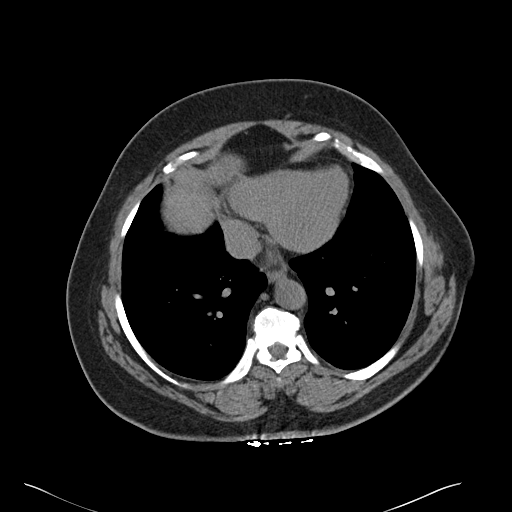
[im 139/146  lung]
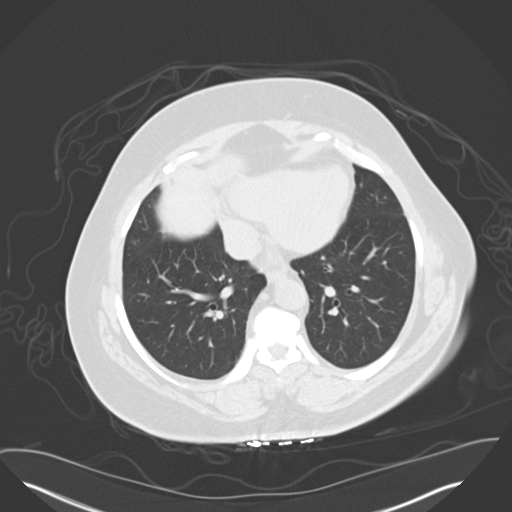

[15 of 32 positions shown; findings below may reference images not displayed]

PROCEDURE:     CT  - CT ABDOMEN AND PELVIS W[DATE]  [DATE]

RESULT:     Nonenhanced CT of the abdomen and pelvis is obtained.  The liver
is normal.  Patient has had a prior cholecystectomy.  The spleen is normal.
Pancreas is normal. Adrenals are normal. No focal renal cortical
abnormalities  are identified. There is no hydronephrosis. Innumerable
calcifications noted in the pelvis.  These are most likely phleboliths.
Nonobstructing ureteral stone cannot excluded.  The bladder is collapsed.
Small shoddy inguinal lymph nodes are noted. There is no bowel distention.
The appendix is not well visualized. There is no evidence of right lower
quadrant inflammatory process. Lung bases are clear. No free air noted.
Postsurgical change noted of the lumbar spine.
IMPRESSION: Nonspecific exam.  If symptoms persist. Contrast enhanced CT
suggested.

## 2010-02-11 IMAGING — CT CT ABD-PELV W/ CM
1 of 2 series · 15 of 32 positions shown, 19 images · non-contrast
Comparison: Prior CT of the abdomen from 02/06/2008.

REASON FOR EXAM: (1) LLQ pain, left flank pain, r/o diverticulitis; (2)
r/o diverticulitis
COMMENTS:

PROCEDURE:     CT  - CT ABDOMEN / PELVIS  W  - February 07, 2008  [DATE]
RESULT:
HISTORY: LEFT flank pain.  History of diverticulitis and prior small bowel
surgery.  History of cholecystectomy, appendectomy and hysterectomy and
RIGHT oophorectomy.

[Series 2: abdomen · axial · 0.77mm/px · z∈[-524,-148]mm · 15 of 53 slices shown, 19 images]
[im 3/53  soft-tissue]
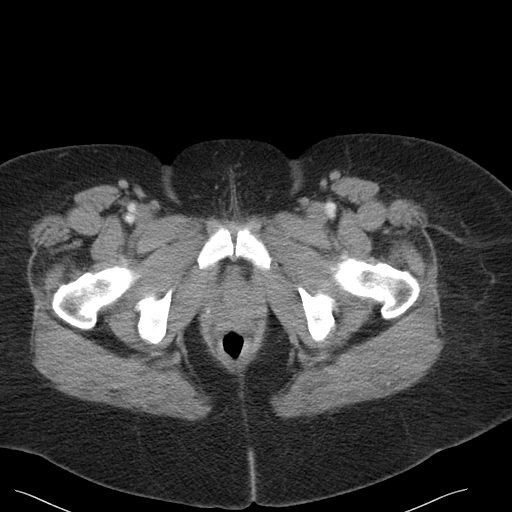
[im 3/53  bone]
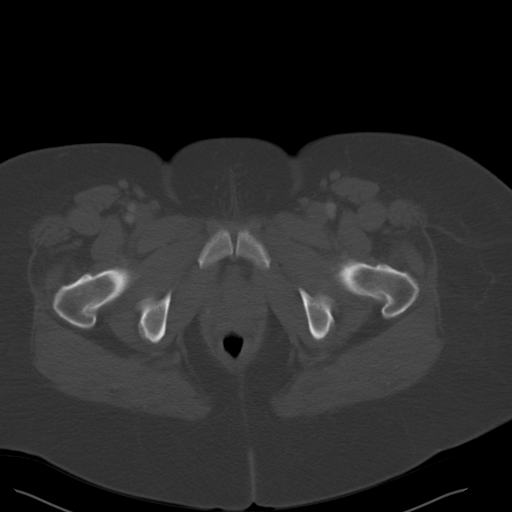
[im 7/53  soft-tissue]
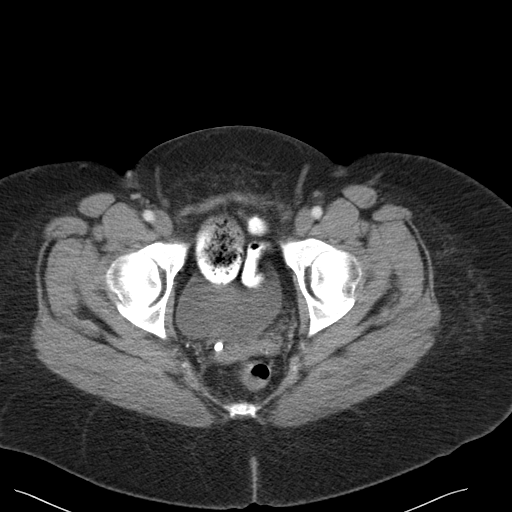
[im 12/53  soft-tissue]
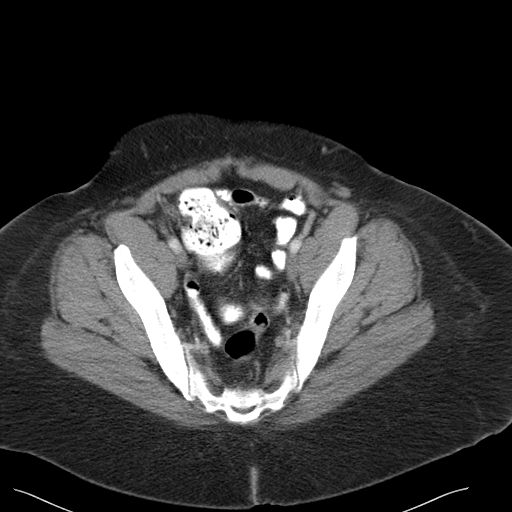
[im 14/53  soft-tissue]
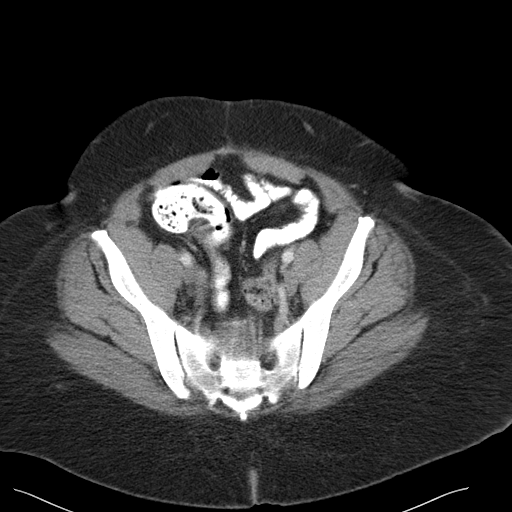
[im 19/53  soft-tissue]
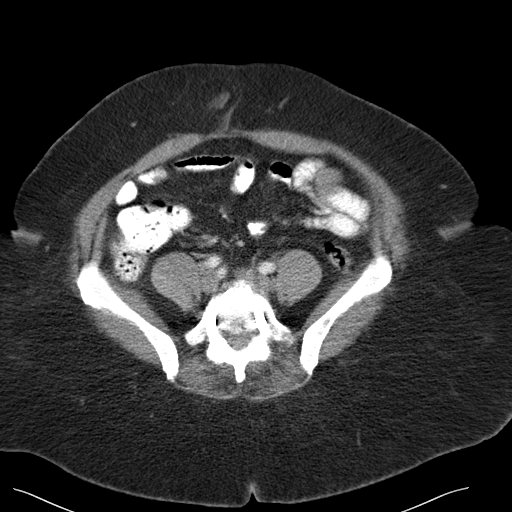
[im 23/53  soft-tissue]
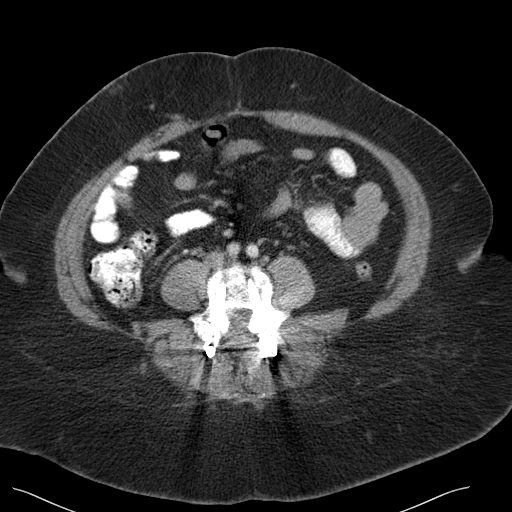
[im 28/53  soft-tissue]
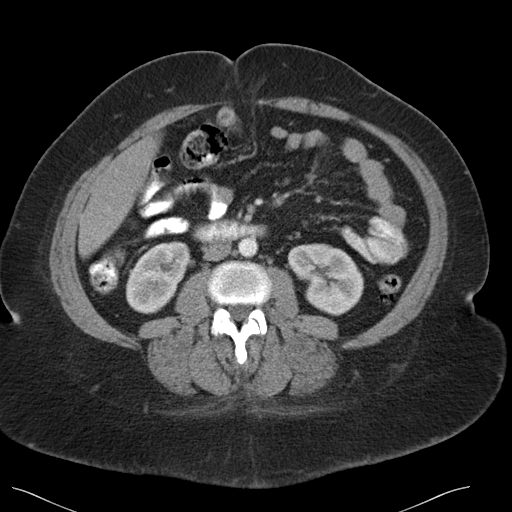
[im 30/53  soft-tissue]
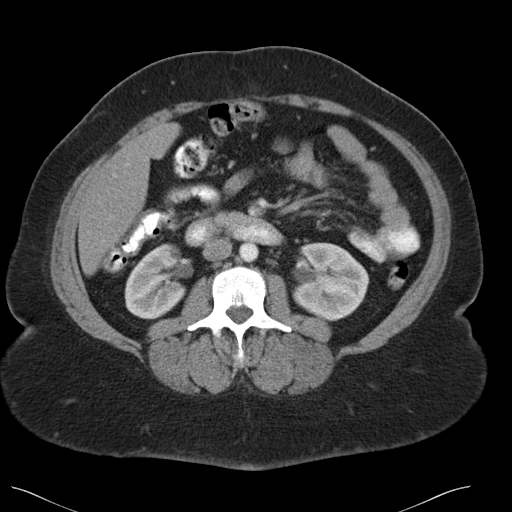
[im 34/53  soft-tissue]
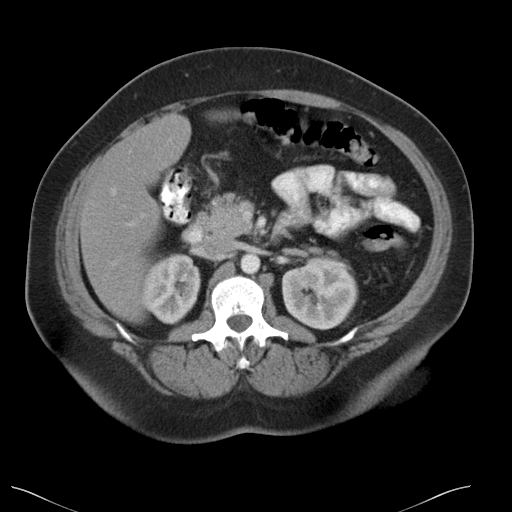
[im 34/53  bone]
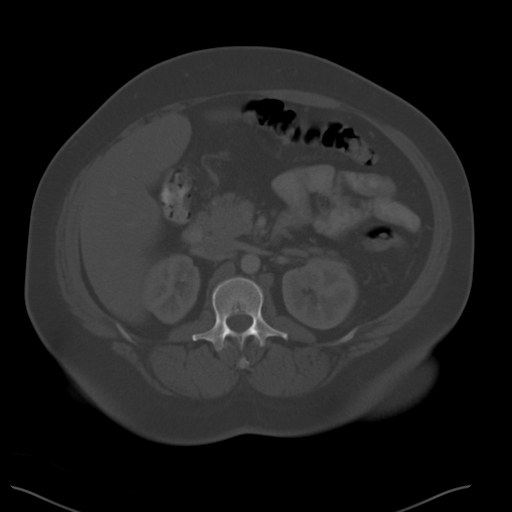
[im 39/53  soft-tissue]
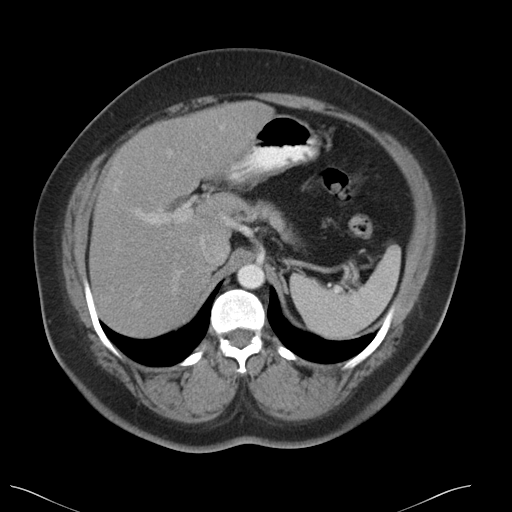
[im 41/53  soft-tissue]
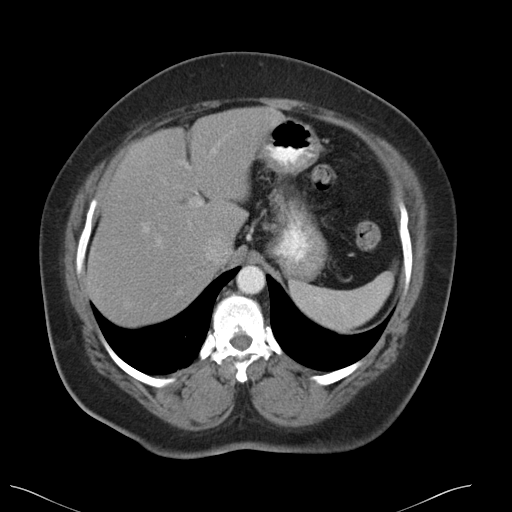
[im 43/53  lung]
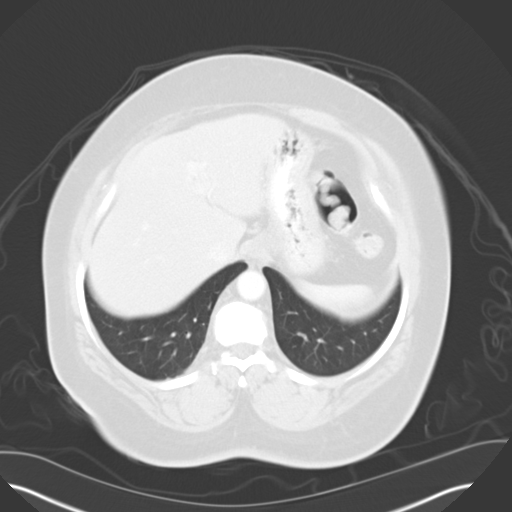
[im 46/53  soft-tissue]
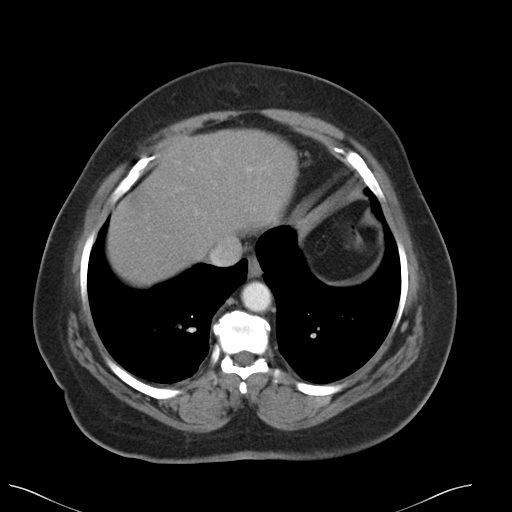
[im 46/53  lung]
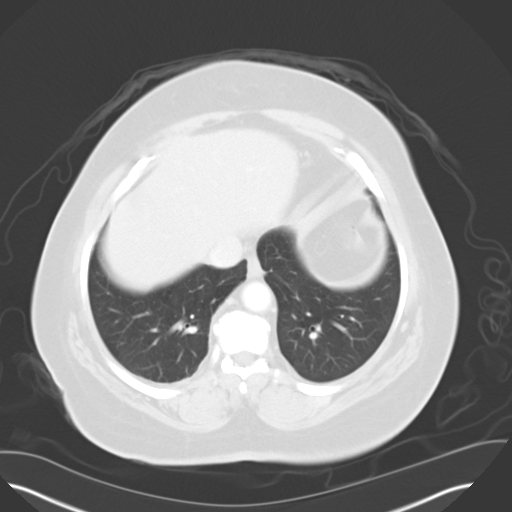
[im 48/53  lung]
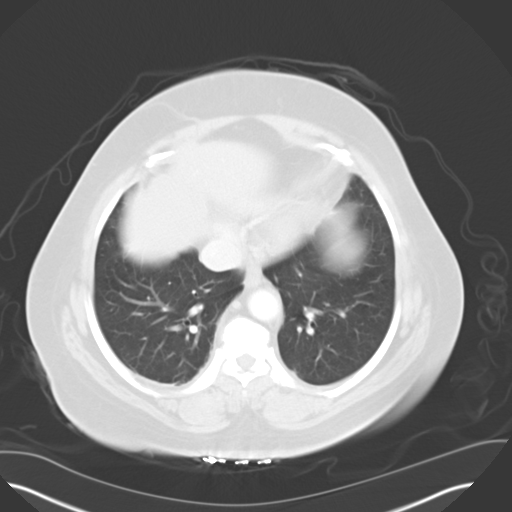
[im 50/53  soft-tissue]
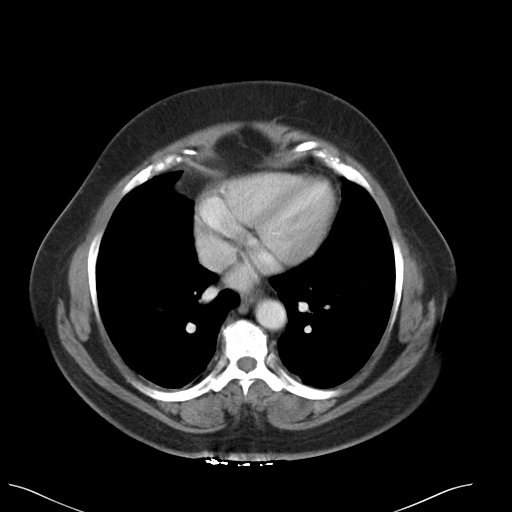
[im 50/53  lung]
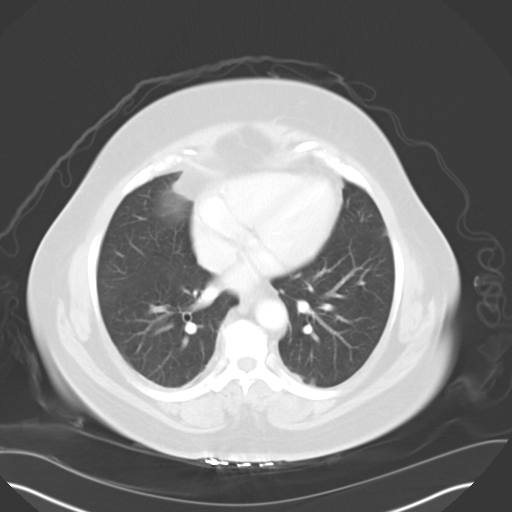

[15 of 32 positions shown; findings below may reference images not displayed]

FINDINGS: Standard IV and oral-enhanced CT of the abdomen and pelvis
obtained.

The liver is normal. Hepatic veins and portal veins are patent. The patient
has had a prior cholecystectomy. Pancreas is normal. There is no biliary
distention. The spleen is normal. The adrenals are normal. No focal renal
cortical abnormalities noted. No hydronephrosis. Innumerable calcified
pelvic densities noted, consistent with phlebolith. No evidence of
obstructing ureteral stone. The bladder is unremarkable and nondistended.
Right lower quadrant is unremarkable. No pathologic pelvic fluid collections
are noted. Lung bases are clear. No evidence of  diverticulitis or bowel
obstruction. There is no free air.
IMPRESSION: No acute abnormalities or interim change from 02/06/2008.

## 2010-02-12 IMAGING — CR DG LUMBAR SPINE 2-3V
1 series · 3 of 3 positions shown · non-contrast
Comparison: none

REASON FOR EXAM: LEFT LUMBAR PAIN  S/P LAMINECTOMY
COMMENTS:

PROCEDURE:     DXR - DXR LUMBAR SPINE AP AND LATERAL  - February 08, 2008  [DATE]
RESULT:     Patient has had a prior L4-L5 fusion.  Surgical clips are noted
in the RIGHT upper quadrant.  No acute bony abnormalities noted.  No
prominent disk degeneration.

[Series 1: view not recorded · 0.17mm/px · 3 of 3 slices shown]
[im 1/3]
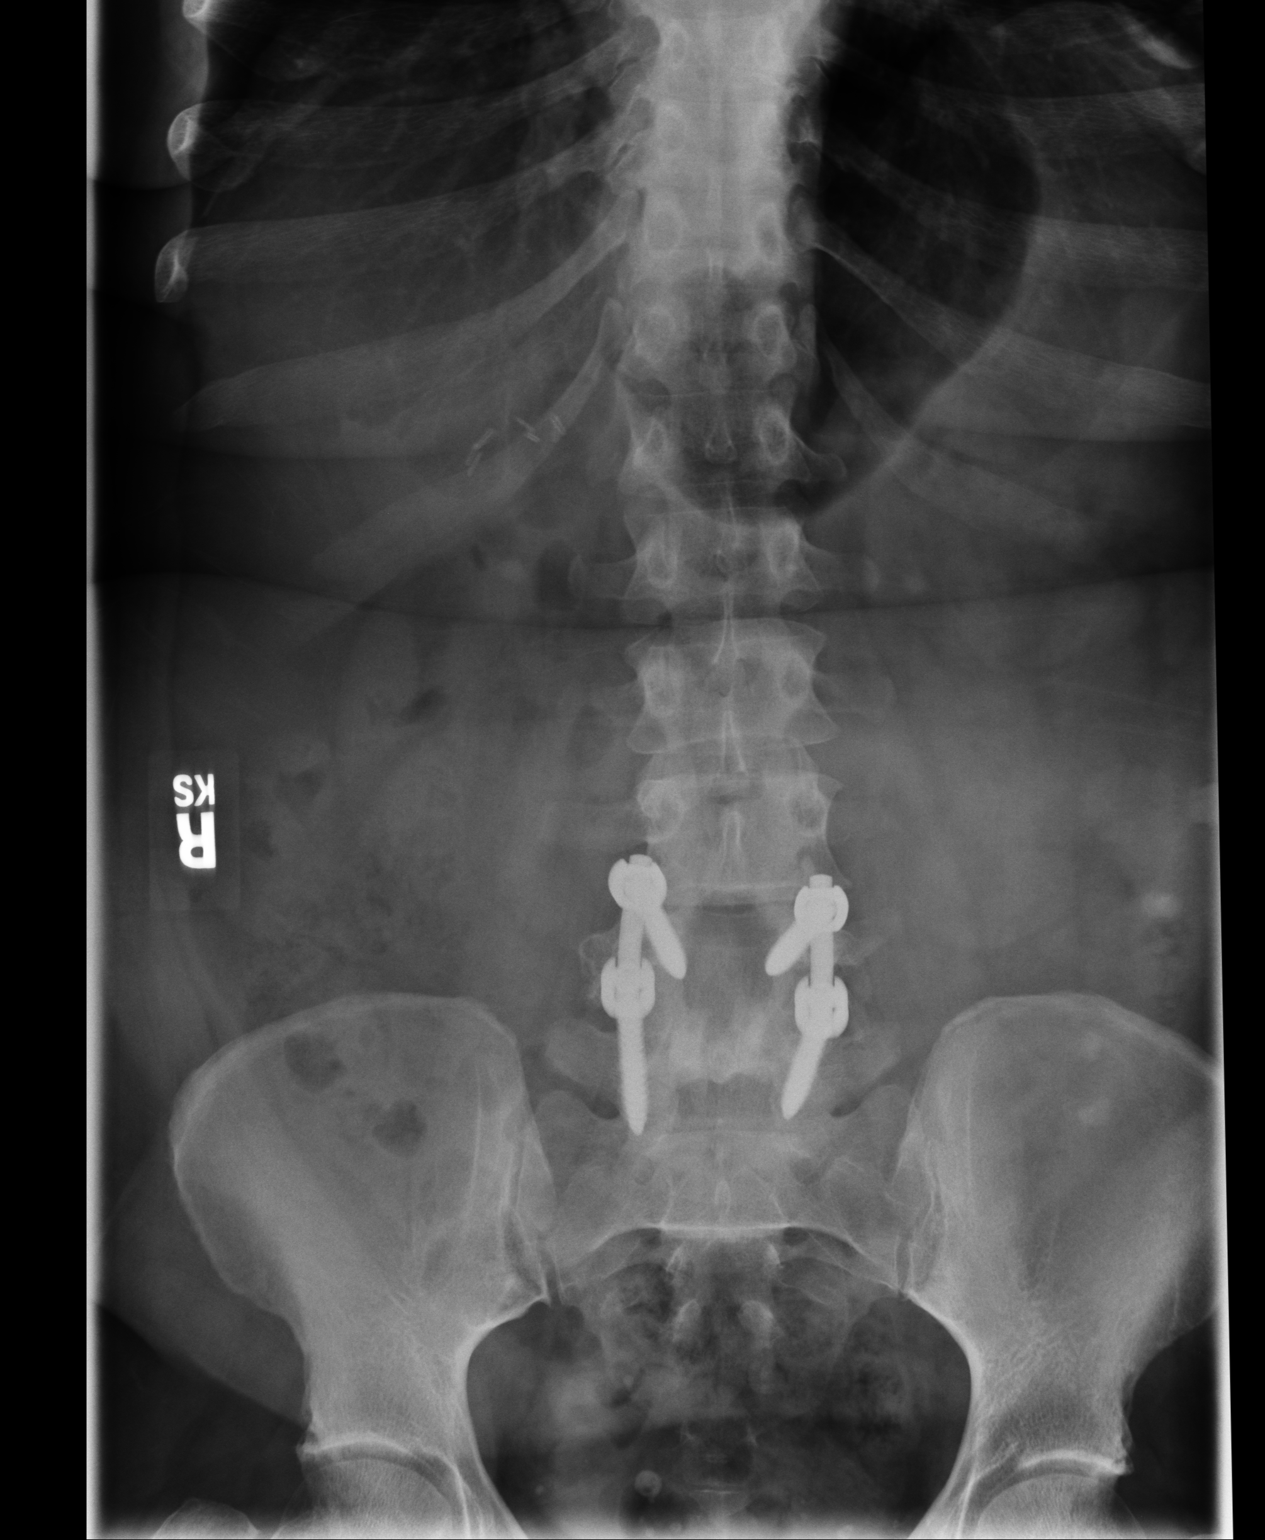
[im 2/3]
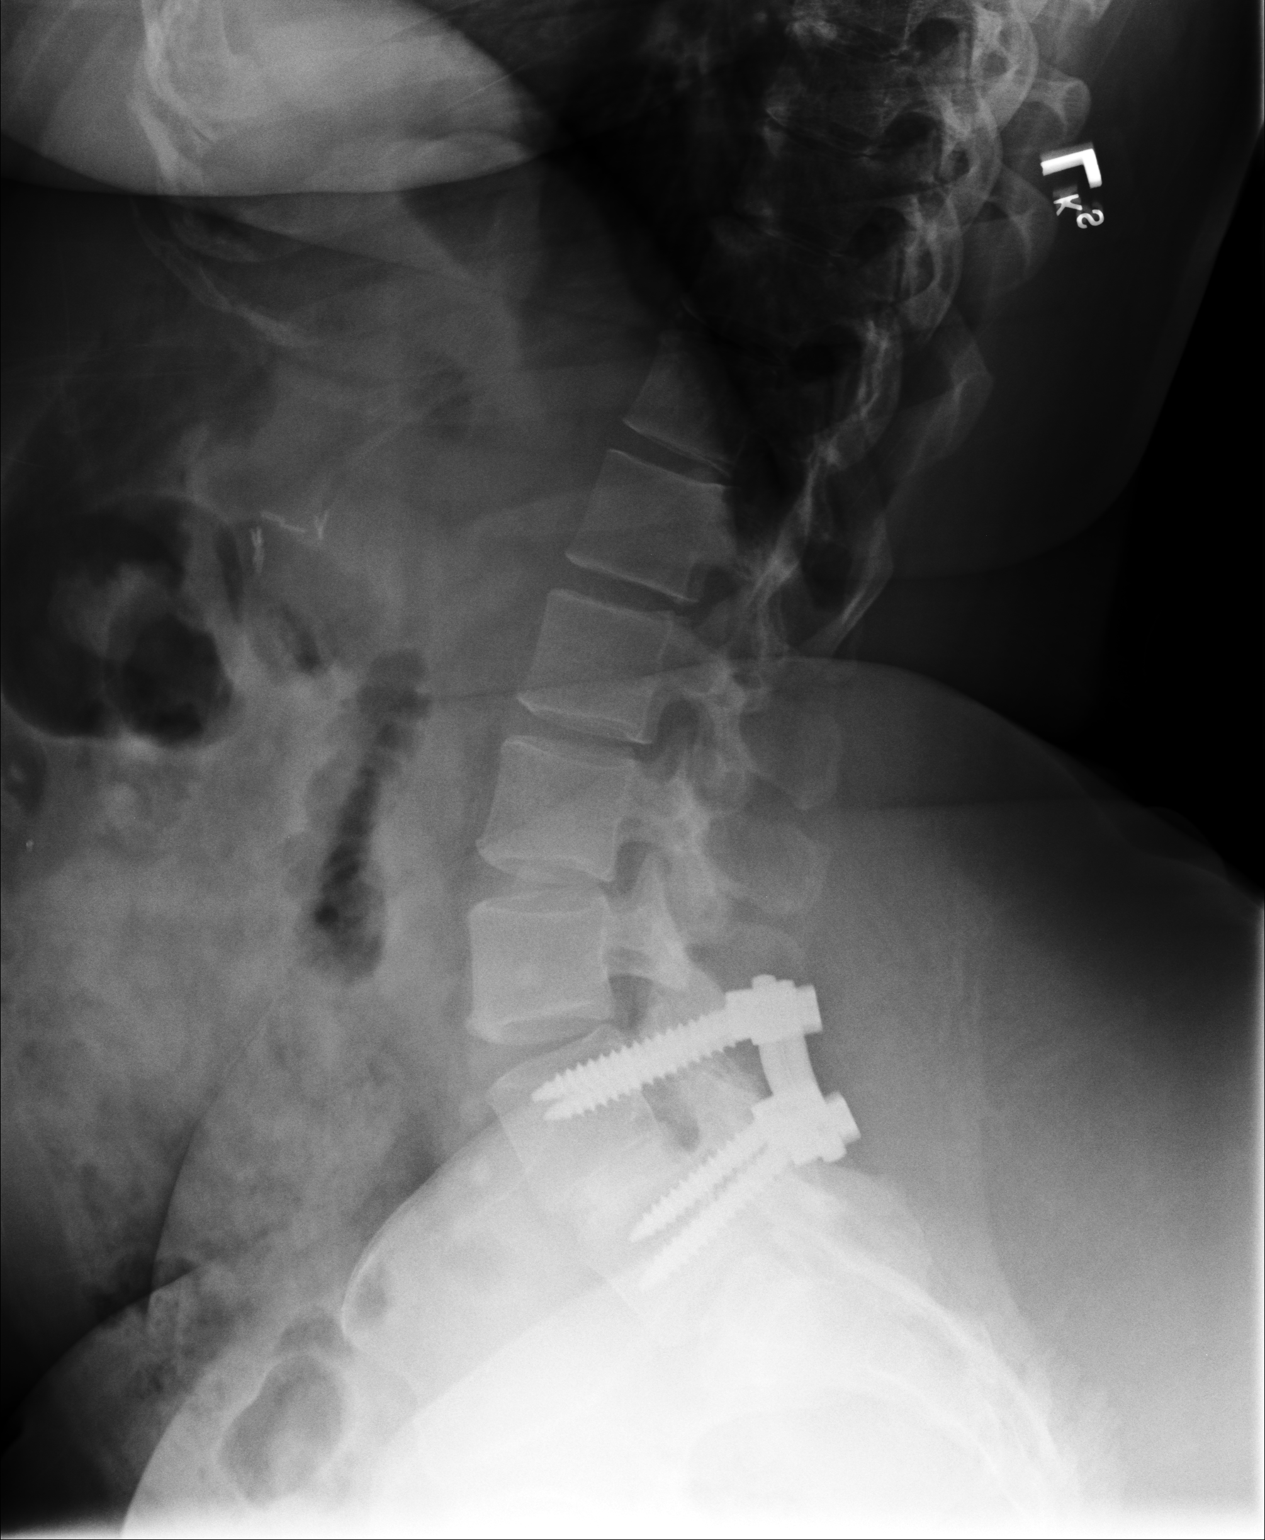
[im 3/3]
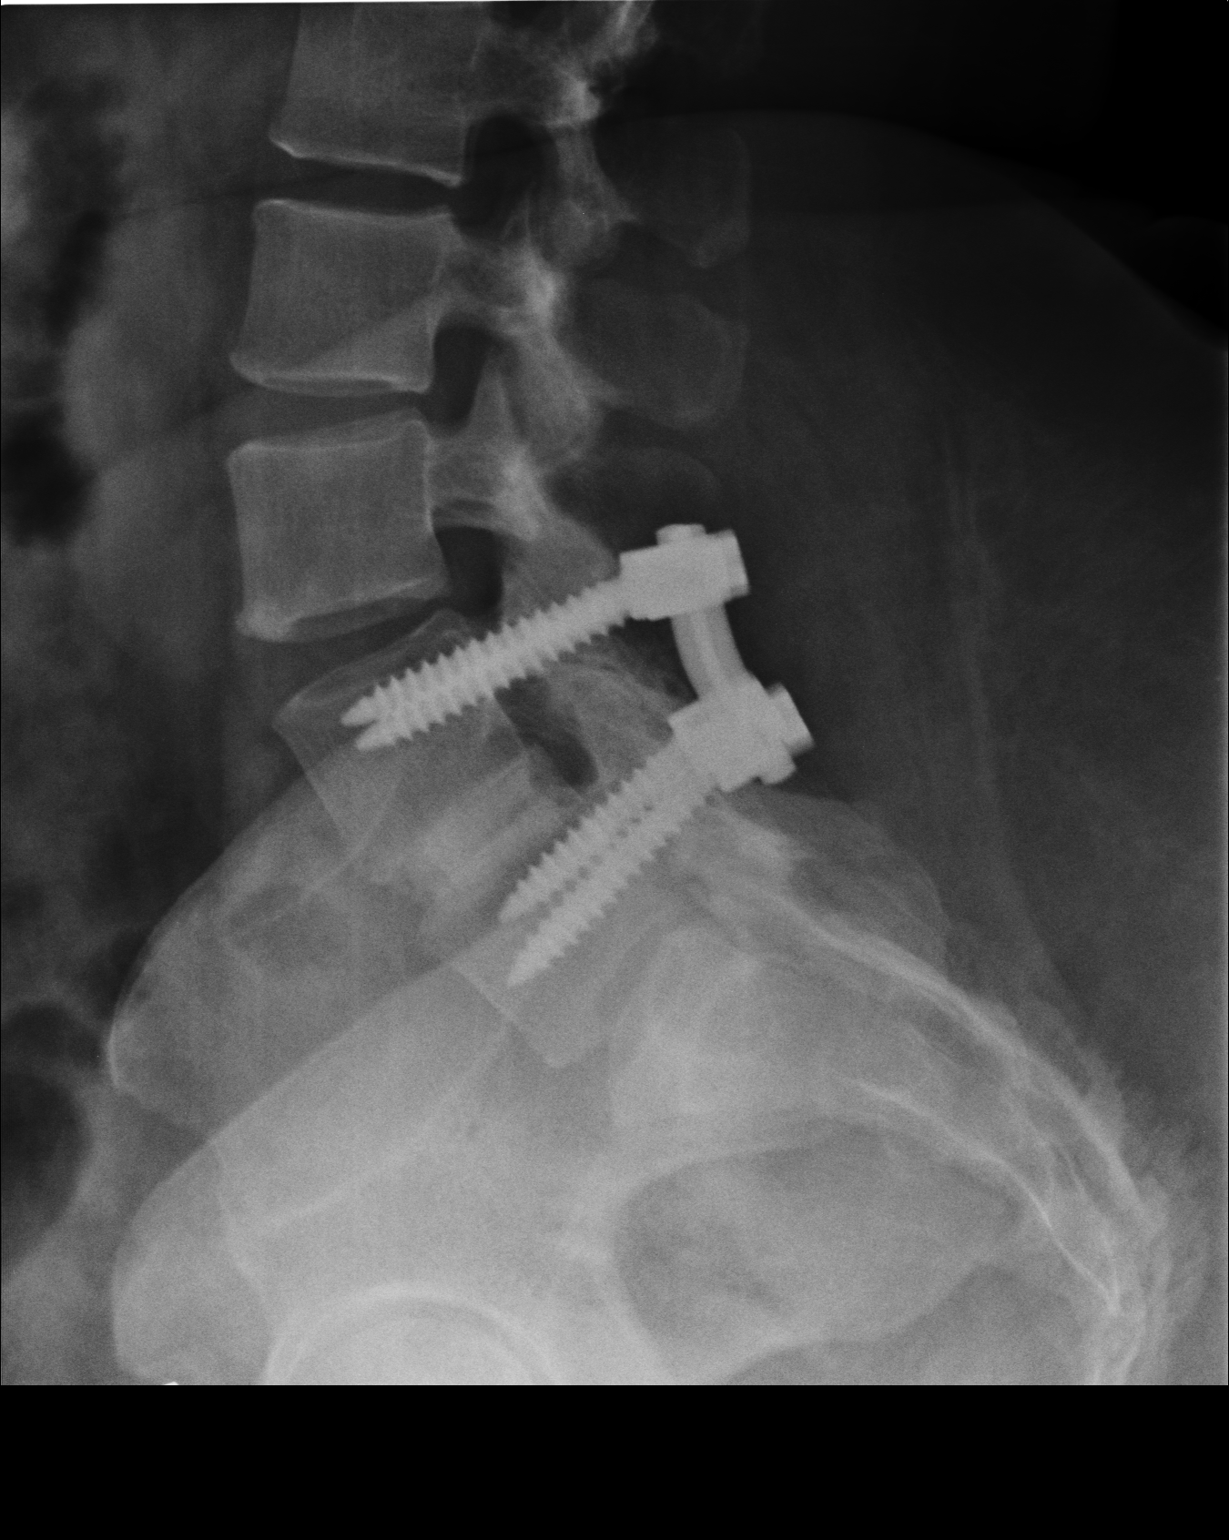

[3 of 3 positions shown; findings below may reference images not displayed]

IMPRESSION: 1.     Patient has had prior L4-L5 fusion.
2.     No acute abnormalities identified.

## 2010-03-23 ENCOUNTER — Encounter: Admission: RE | Admit: 2010-03-23 | Discharge: 2010-03-23 | Payer: Self-pay | Admitting: General Surgery

## 2010-07-27 ENCOUNTER — Encounter: Admission: RE | Admit: 2010-07-27 | Discharge: 2010-09-14 | Payer: Self-pay | Admitting: General Surgery

## 2010-08-28 ENCOUNTER — Inpatient Hospital Stay (HOSPITAL_COMMUNITY): Admission: RE | Admit: 2010-08-28 | Discharge: 2010-08-30 | Payer: Self-pay | Admitting: General Surgery

## 2010-08-29 ENCOUNTER — Encounter (INDEPENDENT_AMBULATORY_CARE_PROVIDER_SITE_OTHER): Payer: Self-pay | Admitting: General Surgery

## 2010-08-29 ENCOUNTER — Ambulatory Visit: Payer: Self-pay | Admitting: Surgery

## 2010-09-12 ENCOUNTER — Encounter
Admission: RE | Admit: 2010-09-12 | Discharge: 2010-09-12 | Payer: Self-pay | Source: Home / Self Care | Attending: General Surgery | Admitting: General Surgery

## 2010-10-30 ENCOUNTER — Encounter
Admission: RE | Admit: 2010-10-30 | Discharge: 2010-10-30 | Payer: Self-pay | Source: Home / Self Care | Attending: General Surgery | Admitting: General Surgery

## 2011-01-04 IMAGING — MG MAM DGTL SCREENING MAMMO W/CAD
1 series · 4 of 4 positions shown · non-contrast
Comparison: none

REASON FOR EXAM: screening mammo
COMMENTS:

PROCEDURE:     MAM - MAM DGTL SCREENING MAMMO W/CAD  - December 30, 2008  [DATE]
RESULT:
COMPARISONS: 10-15-07 and 02-13-05 from [REDACTED].

[R CC · right · 4 of 4 slices shown]
[im 1/4]
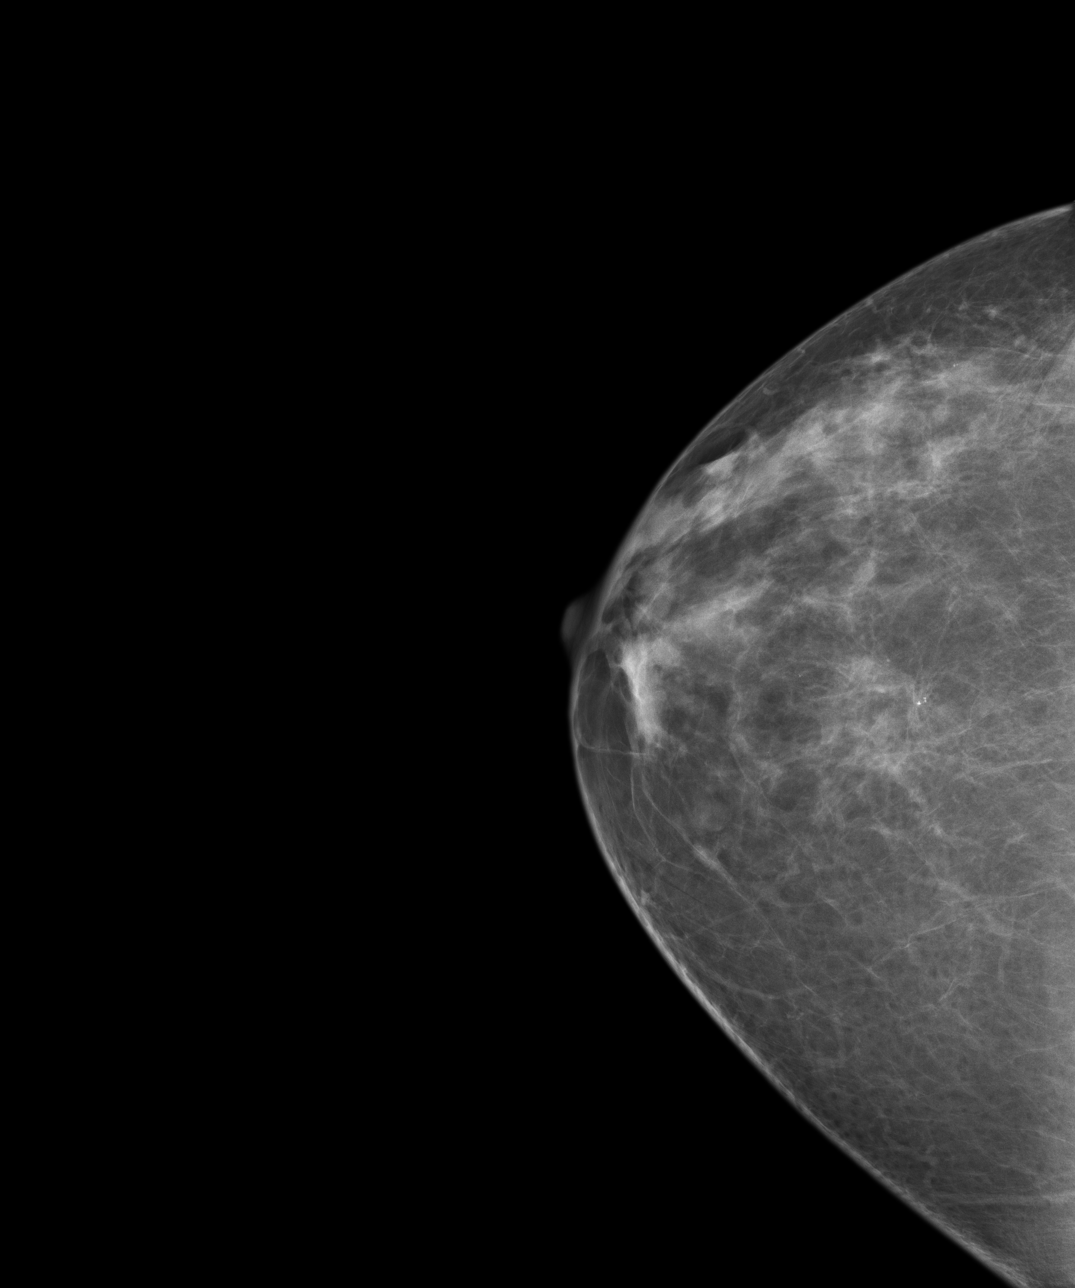
[im 2/4]
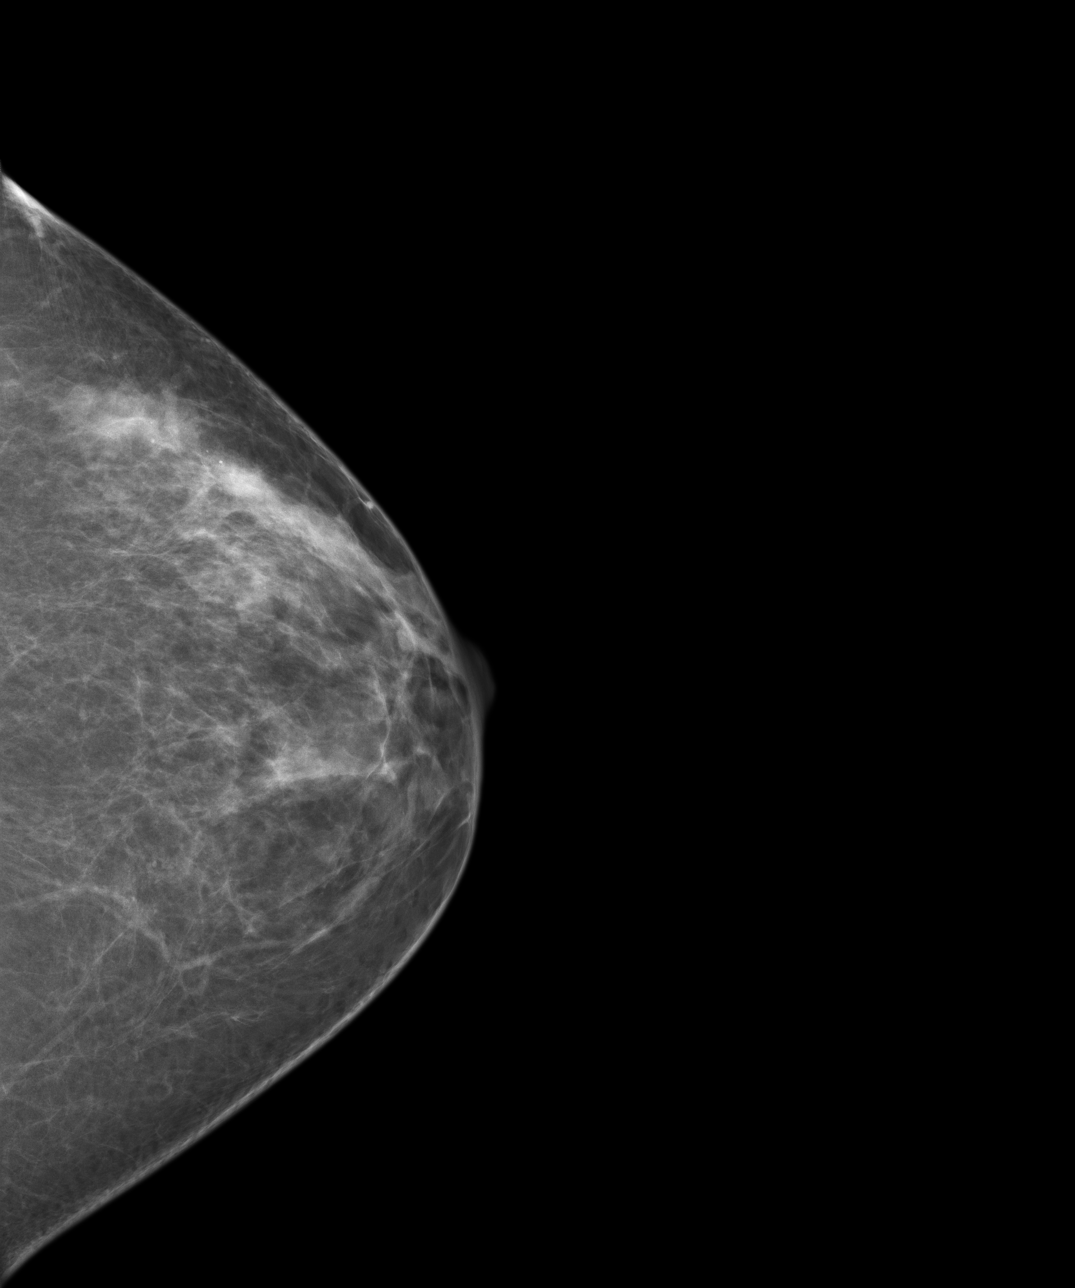
[im 3/4]
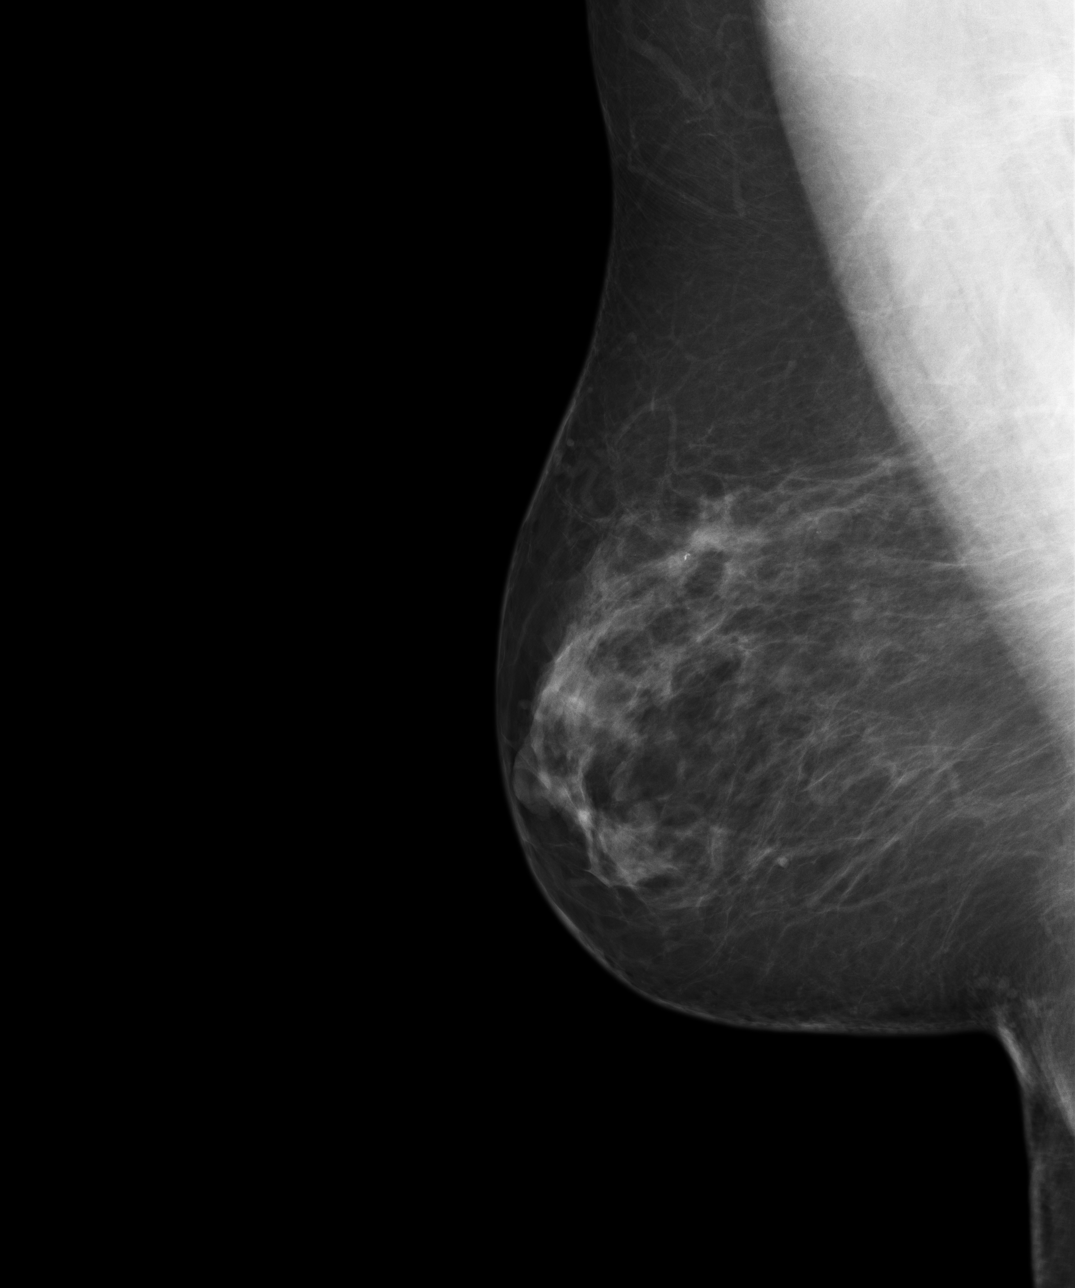
[im 4/4]
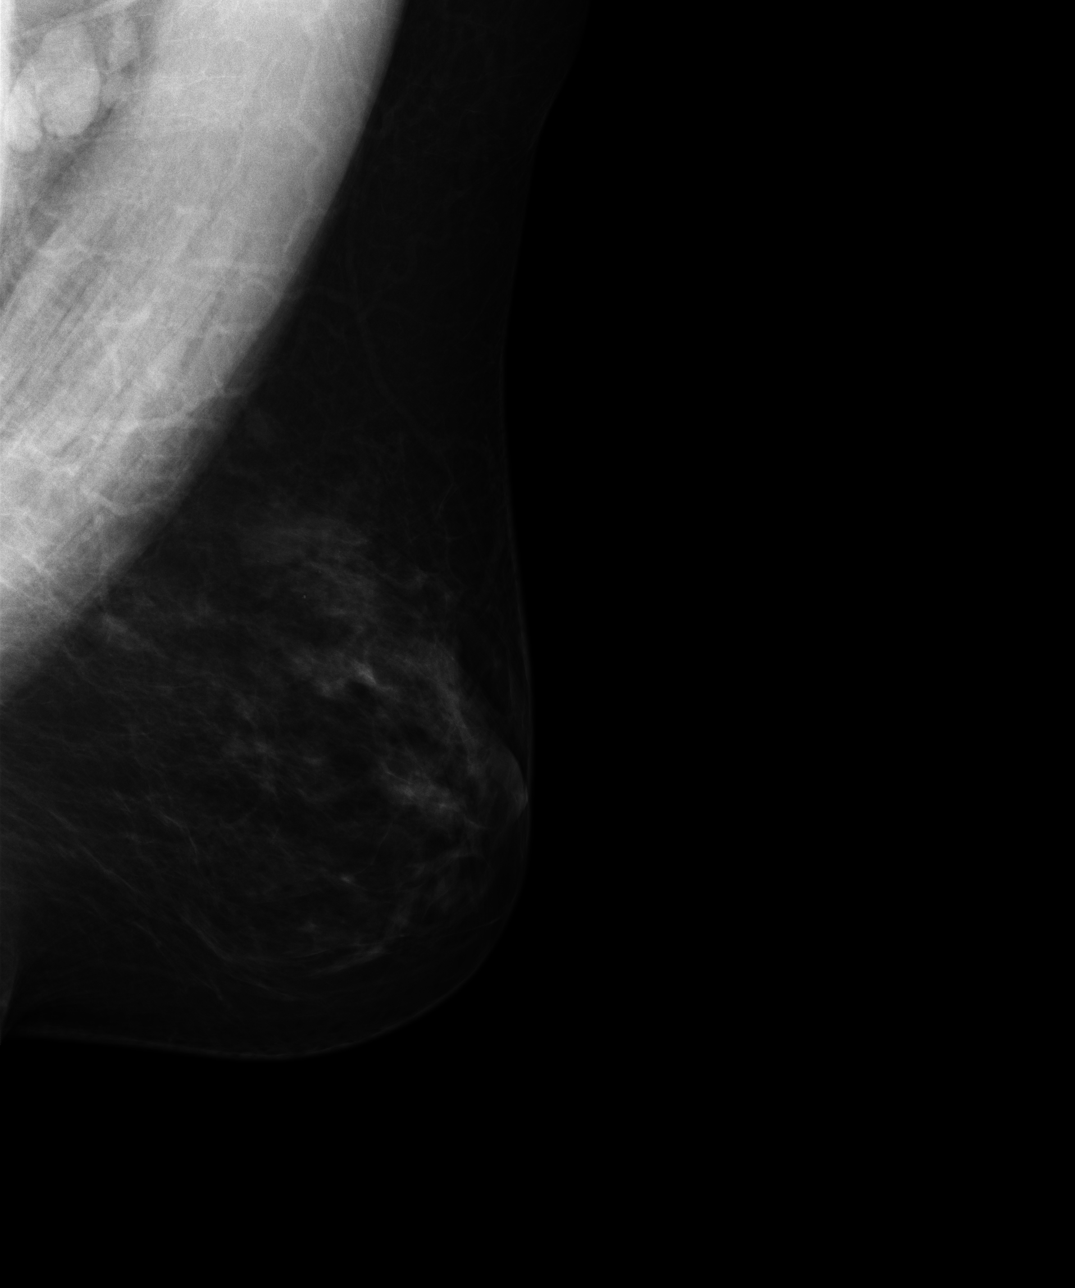

[4 of 4 positions shown; findings below may reference images not displayed]

FINDINGS: Bilateral breasts demonstrate scattered fibroglandular elements.
There is no dominant mass, architectural distortion, or clusters of
suspicious appearing microcalcifications.
IMPRESSION: 1. Stable, bilateral mammogram.

2. Annual mammogram is recommended.

BI-RADS: Category 2-Benign Finding

A NEGATIVE MAMMOGRAM REPORT DOES NOT PRECLUDE BIOPSY OR OTHER EVALUATION OF
A CLINICALLY PALPABLE OR OTHERWISE SUSPICIOUS MASS OR LESION. BREAST CANCER
MAY NOT BE DETECTED BY MAMMOGRAPHY IN UP TO 10% OF CASES.

## 2011-03-01 LAB — HEMOGLOBIN AND HEMATOCRIT, BLOOD
HCT: 33.7 % — ABNORMAL LOW (ref 36.0–46.0)
HCT: 35.9 % — ABNORMAL LOW (ref 36.0–46.0)
Hemoglobin: 11.4 g/dL — ABNORMAL LOW (ref 12.0–15.0)
Hemoglobin: 12.2 g/dL (ref 12.0–15.0)

## 2011-03-01 LAB — DIFFERENTIAL
Basophils Absolute: 0 10*3/uL (ref 0.0–0.1)
Basophils Absolute: 0 10*3/uL (ref 0.0–0.1)
Basophils Absolute: 0.1 10*3/uL (ref 0.0–0.1)
Basophils Relative: 0 % (ref 0–1)
Basophils Relative: 1 % (ref 0–1)
Basophils Relative: 1 % (ref 0–1)
Eosinophils Absolute: 0 10*3/uL (ref 0.0–0.7)
Eosinophils Absolute: 0.1 10*3/uL (ref 0.0–0.7)
Eosinophils Absolute: 0.1 10*3/uL (ref 0.0–0.7)
Eosinophils Relative: 0 % (ref 0–5)
Eosinophils Relative: 1 % (ref 0–5)
Eosinophils Relative: 1 % (ref 0–5)
Lymphocytes Relative: 11 % — ABNORMAL LOW (ref 12–46)
Lymphocytes Relative: 18 % (ref 12–46)
Lymphocytes Relative: 29 % (ref 12–46)
Lymphs Abs: 1.2 10*3/uL (ref 0.7–4.0)
Lymphs Abs: 1.8 10*3/uL (ref 0.7–4.0)
Lymphs Abs: 2.2 10*3/uL (ref 0.7–4.0)
Monocytes Absolute: 0.5 10*3/uL (ref 0.1–1.0)
Monocytes Absolute: 0.7 10*3/uL (ref 0.1–1.0)
Monocytes Absolute: 0.9 10*3/uL (ref 0.1–1.0)
Monocytes Relative: 6 % (ref 3–12)
Monocytes Relative: 7 % (ref 3–12)
Monocytes Relative: 8 % (ref 3–12)
Neutro Abs: 4.8 10*3/uL (ref 1.7–7.7)
Neutro Abs: 7.2 10*3/uL (ref 1.7–7.7)
Neutro Abs: 8.6 10*3/uL — ABNORMAL HIGH (ref 1.7–7.7)
Neutrophils Relative %: 63 % (ref 43–77)
Neutrophils Relative %: 72 % (ref 43–77)
Neutrophils Relative %: 80 % — ABNORMAL HIGH (ref 43–77)

## 2011-03-01 LAB — GLUCOSE, CAPILLARY
Glucose-Capillary: 103 mg/dL — ABNORMAL HIGH (ref 70–99)
Glucose-Capillary: 106 mg/dL — ABNORMAL HIGH (ref 70–99)
Glucose-Capillary: 107 mg/dL — ABNORMAL HIGH (ref 70–99)
Glucose-Capillary: 117 mg/dL — ABNORMAL HIGH (ref 70–99)
Glucose-Capillary: 121 mg/dL — ABNORMAL HIGH (ref 70–99)
Glucose-Capillary: 122 mg/dL — ABNORMAL HIGH (ref 70–99)
Glucose-Capillary: 123 mg/dL — ABNORMAL HIGH (ref 70–99)
Glucose-Capillary: 124 mg/dL — ABNORMAL HIGH (ref 70–99)
Glucose-Capillary: 128 mg/dL — ABNORMAL HIGH (ref 70–99)
Glucose-Capillary: 132 mg/dL — ABNORMAL HIGH (ref 70–99)
Glucose-Capillary: 140 mg/dL — ABNORMAL HIGH (ref 70–99)
Glucose-Capillary: 159 mg/dL — ABNORMAL HIGH (ref 70–99)
Glucose-Capillary: 165 mg/dL — ABNORMAL HIGH (ref 70–99)
Glucose-Capillary: 191 mg/dL — ABNORMAL HIGH (ref 70–99)

## 2011-03-01 LAB — SURGICAL PCR SCREEN
MRSA, PCR: NEGATIVE
Staphylococcus aureus: POSITIVE — AB

## 2011-03-01 LAB — COMPREHENSIVE METABOLIC PANEL
ALT: 28 U/L (ref 0–35)
AST: 25 U/L (ref 0–37)
Albumin: 3.7 g/dL (ref 3.5–5.2)
Alkaline Phosphatase: 90 U/L (ref 39–117)
BUN: 12 mg/dL (ref 6–23)
CO2: 30 mEq/L (ref 19–32)
Calcium: 9.5 mg/dL (ref 8.4–10.5)
Chloride: 105 mEq/L (ref 96–112)
Creatinine, Ser: 0.88 mg/dL (ref 0.4–1.2)
GFR calc Af Amer: >60 mL/min (ref >60)
GFR calc non Af Amer: >60 mL/min (ref >60)
Glucose, Bld: 85 mg/dL (ref 70–99)
Potassium: 3.9 mEq/L (ref 3.5–5.1)
Sodium: 142 mEq/L (ref 135–145)
Total Bilirubin: 0.4 mg/dL (ref 0.3–1.2)
Total Protein: 7.5 g/dL (ref 6.0–8.3)

## 2011-03-01 LAB — CBC
HCT: 29.3 % — ABNORMAL LOW (ref 36.0–46.0)
HCT: 32.8 % — ABNORMAL LOW (ref 36.0–46.0)
HCT: 38.1 % (ref 36.0–46.0)
Hemoglobin: 10.1 g/dL — ABNORMAL LOW (ref 12.0–15.0)
Hemoglobin: 11.1 g/dL — ABNORMAL LOW (ref 12.0–15.0)
Hemoglobin: 12.8 g/dL (ref 12.0–15.0)
MCH: 28.2 pg (ref 26.0–34.0)
MCH: 28.5 pg (ref 26.0–34.0)
MCH: 29.4 pg (ref 26.0–34.0)
MCHC: 33.5 g/dL (ref 30.0–36.0)
MCHC: 33.9 g/dL (ref 30.0–36.0)
MCHC: 34.6 g/dL (ref 30.0–36.0)
MCV: 84 fL (ref 78.0–100.0)
MCV: 84.2 fL (ref 78.0–100.0)
MCV: 84.8 fL (ref 78.0–100.0)
Platelets: 247 10*3/uL (ref 150–400)
Platelets: 272 10*3/uL (ref 150–400)
Platelets: 311 10*3/uL (ref 150–400)
RBC: 3.45 MIL/uL — ABNORMAL LOW (ref 3.87–5.11)
RBC: 3.91 MIL/uL (ref 3.87–5.11)
RBC: 4.53 MIL/uL (ref 3.87–5.11)
RDW: 13.6 % (ref 11.5–15.5)
RDW: 14.4 % (ref 11.5–15.5)
RDW: 14.8 % (ref 11.5–15.5)
WBC: 10.7 10*3/uL — ABNORMAL HIGH (ref 4.0–10.5)
WBC: 7.6 10*3/uL (ref 4.0–10.5)
WBC: 9.9 10*3/uL (ref 4.0–10.5)

## 2011-03-23 LAB — DIFFERENTIAL
Basophils Absolute: 0 10*3/uL (ref 0.0–0.1)
Basophils Relative: 0 % (ref 0–1)
Eosinophils Absolute: 0.1 10*3/uL (ref 0.0–0.7)
Eosinophils Relative: 1 % (ref 0–5)
Lymphocytes Relative: 36 % (ref 12–46)
Lymphs Abs: 3.3 10*3/uL (ref 0.7–4.0)
Monocytes Absolute: 0.6 10*3/uL (ref 0.1–1.0)
Monocytes Relative: 7 % (ref 3–12)
Neutro Abs: 5.2 10*3/uL (ref 1.7–7.7)
Neutrophils Relative %: 56 % (ref 43–77)

## 2011-03-23 LAB — COMPREHENSIVE METABOLIC PANEL
ALT: 17 U/L (ref 0–35)
AST: 26 U/L (ref 0–37)
Albumin: 3.8 g/dL (ref 3.5–5.2)
Alkaline Phosphatase: 82 U/L (ref 39–117)
BUN: 8 mg/dL (ref 6–23)
CO2: 28 mEq/L (ref 19–32)
Calcium: 9.1 mg/dL (ref 8.4–10.5)
Chloride: 107 mEq/L (ref 96–112)
Creatinine, Ser: 0.77 mg/dL (ref 0.4–1.2)
GFR calc Af Amer: >60 mL/min (ref >60)
GFR calc non Af Amer: >60 mL/min (ref >60)
Glucose, Bld: 74 mg/dL (ref 70–99)
Potassium: 4.2 mEq/L (ref 3.5–5.1)
Sodium: 142 mEq/L (ref 135–145)
Total Bilirubin: 0.6 mg/dL (ref 0.3–1.2)
Total Protein: 7.3 g/dL (ref 6.0–8.3)

## 2011-03-23 LAB — PROTIME-INR
INR: 1 (ref 0.00–1.49)
Prothrombin Time: 13.5 seconds (ref 11.6–15.2)

## 2011-03-23 LAB — GLUCOSE, CAPILLARY
Glucose-Capillary: 106 mg/dL — ABNORMAL HIGH (ref 70–99)
Glucose-Capillary: 118 mg/dL — ABNORMAL HIGH (ref 70–99)
Glucose-Capillary: 140 mg/dL — ABNORMAL HIGH (ref 70–99)

## 2011-03-23 LAB — URINALYSIS, ROUTINE W REFLEX MICROSCOPIC
Bilirubin Urine: NEGATIVE
Glucose, UA: NEGATIVE mg/dL
Hgb urine dipstick: NEGATIVE
Ketones, ur: 15 mg/dL — AB
Nitrite: NEGATIVE
Protein, ur: NEGATIVE mg/dL
Specific Gravity, Urine: 1.027 (ref 1.005–1.030)
Urobilinogen, UA: 0.2 mg/dL (ref 0.0–1.0)
pH: 6 (ref 5.0–8.0)

## 2011-03-23 LAB — CBC
HCT: 36.3 % (ref 36.0–46.0)
HCT: 37.8 % (ref 36.0–46.0)
Hemoglobin: 12.1 g/dL (ref 12.0–15.0)
Hemoglobin: 12.6 g/dL (ref 12.0–15.0)
MCHC: 33.3 g/dL (ref 30.0–36.0)
MCHC: 33.4 g/dL (ref 30.0–36.0)
MCV: 85.4 fL (ref 78.0–100.0)
MCV: 85.6 fL (ref 78.0–100.0)
Platelets: 281 10*3/uL (ref 150–400)
Platelets: 282 10*3/uL (ref 150–400)
RBC: 4.25 MIL/uL (ref 3.87–5.11)
RBC: 4.41 MIL/uL (ref 3.87–5.11)
RDW: 15.2 % (ref 11.5–15.5)
RDW: 15.3 % (ref 11.5–15.5)
WBC: 6.2 10*3/uL (ref 4.0–10.5)
WBC: 9.2 10*3/uL (ref 4.0–10.5)

## 2011-03-23 LAB — D-DIMER, QUANTITATIVE: D-Dimer, Quant: 0.22 ug/mL-FEU (ref 0.00–0.48)

## 2011-03-23 LAB — CK TOTAL AND CKMB (NOT AT ARMC)
CK, MB: 2.4 ng/mL (ref 0.3–4.0)
Relative Index: 0.9 (ref 0.0–2.5)
Total CK: 270 U/L — ABNORMAL HIGH (ref 7–177)

## 2011-03-23 LAB — TROPONIN I
Troponin I: 0.01 ng/mL (ref 0.00–0.06)
Troponin I: 0.01 ng/mL (ref 0.00–0.06)
Troponin I: 0.01 ng/mL (ref 0.00–0.06)

## 2011-03-23 LAB — APTT: aPTT: 29 seconds (ref 24–37)

## 2011-03-23 LAB — POCT CARDIAC MARKERS
CKMB, poc: 1 ng/mL (ref 1.0–8.0)
CKMB, poc: 1.6 ng/mL (ref 1.0–8.0)
Myoglobin, poc: 71.5 ng/mL (ref 12–200)
Myoglobin, poc: 95.8 ng/mL (ref 12–200)
Troponin i, poc: <0.05 ng/mL (ref 0.00–0.09)
Troponin i, poc: <0.05 ng/mL (ref 0.00–0.09)

## 2011-03-23 LAB — LIPID PANEL
Cholesterol: 185 mg/dL (ref 0–200)
HDL: 70 mg/dL (ref >39)
LDL Cholesterol: 97 mg/dL (ref 0–99)
Total CHOL/HDL Ratio: 2.6 RATIO
Triglycerides: 88 mg/dL (ref <150)
VLDL: 18 mg/dL (ref 0–40)

## 2011-03-23 LAB — TSH: TSH: 1.77 u[IU]/mL (ref 0.350–4.500)

## 2011-03-23 LAB — CK
Total CK: 177 U/L (ref 7–177)
Total CK: 187 U/L — ABNORMAL HIGH (ref 7–177)

## 2011-05-04 NOTE — Op Note (Signed)
[place]  Patient:    Julie Acevedo, Julie Acevedo Visit Number: [id number] MRN: [id number]          Service Type: SUR Location: [id number] Attending Physician:  Donn Pierini Dictated by:   Julio Sicks, M.D. Proc. Date: 02/17/02 Admit Date:  02/17/2002                             Operative Report  PREOPERATIVE DIAGNOSIS:  L4-5 degenerative disk disease with stenosis and chronic pain.  POSTOPERATIVE DIAGNOSIS:  L4-5 degenerative disk disease with stenosis and chronic pain.  PROCEDURES: 1. Re-exploration of right L4-5 laminotomy with complete L5 and L4    laminectomies. 2. L4 and L5 decompressive foraminotomies. 3. L4-5 posterior lumbar interbody fusion utilizing Tangent wedges and local    autograft. 4. L4-5 posterolateral fusion utilizing pedicle screw instrumentation and    local autograft.  SURGEON:  Julio Sicks, M.D.  ASSISTANT:  Donalee Citrin, Montez Hageman., M.D.  ANESTHESIA:  General endotracheal.  INDICATION:  [name] is a 60 year old female with history of chronic back pain and intermittent right lower extremity radicular symptoms consistent with a right-sided L5 radiculopathy.  The patient is status post previous right-sided L4-5 decompressive laminotomy, which improved her leg pain but she is still debilitated by her back pain.  She has failed a very long and appropriate course of conservative management.  She presents now for decompression and fusion surgery at L4-5 in hopes of alleviating some of her symptoms.  DESCRIPTION OF PROCEDURE:  Patient taken to the operating room and placed on the operating table in a supine position.  After an adequate level of anesthesia achieved, patient positioned prone onto a Wilson frame, appropriately padded.  The patients lumbar region was prepped and draped sterilely.  A 10 blade was used to make a linear skin incision overlying the L3, L4, and L5 levels.  This was carried down sharply in the midline.   A subperiosteal dissection then performed bilaterally, exposing the laminae and facet joints of L3, L4, and L5, as well as the transverse processes of L4 and L5.  Deep self-retaining retractor was placed.  Intraoperative fluoroscopy was used, and the level was confirmed.  Laminotomy on the right side at L4-5 was dissected free.  The lamina of L4 and L5 were then completely resected using Kerrison rongeurs, Leksell rongeurs, and the high-speed drill.  All elements of bone were cleaned and used in later autografting.  The inferior facets of L4 and the superior facets of L5 were also resected, and this bone was also re-used in fusion.  Ligamentum flavum was then elevated and resected in piecemeal fashion using Kerrison rongeurs.  The underlying thecal sac was identified.  In the process of dissecting epidural scar off the right side, a small dural laceration was made.  The dural laceration was isolated and oversewn with 5-0 Prolene in a simple fashion.  There was no evidence of any CSF leakage.  Attention was then placed back to the interspace.  Epidural venous plexus coagulated and cut.  Turning first to the patients right side, thecal sac and nerve roots were mobilized and retracted toward the midline. The disk space was isolated, incised with a 15 blade in rectangular fashion. A wide disk space clean-out was achieved using the pituitary rongeurs, upward-angled pituitary rongeurs, and Epstein curettes.  After very aggressive diskectomy had been performed on the patients right side, attention was placed on the left side.  Once again the thecal sac and nerve roots were protected.  Disk space was then incised with a 15 blade, and an aggressive diskectomy was performed in the patients left side.  The disk space was then sequentially distracted up to 11 mm with the 11 mm distractor left in the patients left side.  The nerve roots were then retracted and protected at L4-5.  The disk space was then  reamed with a 10 mm cutter and then cut with a 10 mm Tangent chisel.  All loose material was removed from the interspace.  A 10 x 24 mm Tangent wedge was then impacted into place and recessed approximately 2 mm from the posterior cortical margin.  The distractor was then moved to the contralateral side.  Once again nerve roots were protected. The disk space was then reamed and then cut with a 10 mm chisel.  Morcellized autograft was then packed into the interspace.  A second 10 x 24 mm Tangent wedge was then impacted into place and again recessed approximately 1-2 mm from the posterior cortical margin.  Attention was then placed to the pedicles at L4 and L5.  These were isolated by surface landmarks and fluoroscopic guidance.  Superficial bone was removed overlying the pedicle using the high-speed drill.  Each pedicle was then probed using a pedicle awl.  Each pedicle awl track was found to be solidly within bone.  Each pedicle awl track was then tapped with a 5.25 mm screw tap.  Each screw tap hole was found to be solidly within bone.  Spiral 90 6.75 x 45 mm screws were then placed bilaterally at L4.  A 6.75 x 40 mm screw was placed on the right side at L5 and a 6.75 x 35 mm screw was placed on the left side at L5.  All screws were found to be solidly within bone.  The transverse processes of L4 and L5 were then decorticated using the high-speed drill.  Morcellized autograft was packed posterolaterally for later fusion.  A short segment of titanium rod was then contoured and placed over the screw heads at L4 and L5.  The screw heads were then held in place with locking caps.  The locking caps were engaged in the inferior screws.  The construct was placed under compression, and the superior screws were engaged.  Final images revealed good position of the bone grafts and hardware with proper operative level, with normal alignment of the spine at L4-5.  The wound was then irrigated with  antibiotic solution. Tisseel was then placed over the dural repair, which was not leaking.  Gelfoam  was placed over the laminectomy defect.  Hemostasis was then achieved with the electrocautery.  The wound was then closed in layers with Vicryl sutures. Steri-Strips and sterile dressing were applied.  There were no apparent complications.  The patient tolerated the procedure well, and she returns to the recovery room postop. Dictated by:   Julio Sicks, M.D. Attending Physician:  Donn Pierini DD:  02/17/02 TD:  02/17/02 Job: [id number]

## 2011-05-04 NOTE — Procedures (Signed)
INDICATIONS:  This patient has a history of right brain stroke with left  hemiparesis and is being evaluated for this with EEG.   DESCRIPTION OF PROCEDURE:  This EEG was recorded during the awake and drowsy  state.  Some evidence of stage II sleep was noted during this recording.  The background activity during the awake state was 8 hertz rhythms with  higher amplitude seen in the posterior head regions.  There is no evidence  of any epileptiform activity seen.  Intermittently in this EEG, there is  some right frontal slowing present.  This is, however, very mild and not  always consistently present.   IMPRESSION:  This is an abnormal electroencephalogram showing evidence of  mild intermittent right frontal slowing without definite evidence of  epileptiform activity seen.  Much of this EEG is recorded during the awaken  state to sleep state.    Evie Lacks, M.D.   [id number]  D:  03/27/2004 22:43:06  T:  03/28/2004 00:35:39  Job #:  [id number]

## 2011-05-04 NOTE — Consult Note (Signed)
NAMEGWENDY, Acevedo NO.:  [id number]   MEDICAL RECORD NO.:  [id number]          PATIENT TYPE:  EMS   LOCATION:  MAJO                         FACILITY:  [facility]   PHYSICIAN:  Pramod P. Pearlean Brownie, MD    DATE OF BIRTH:  [birth year]   DATE OF CONSULTATION:  07/27/2006  DATE OF DISCHARGE:                                   CONSULTATION   REFERRING PHYSICIAN:  __________,MD   REASON FOR REFERRAL:  Code stroke.   HISTORY OF PRESENT ILLNESS:  Julie Acevedo is a 60 year old African American  lady who woke up this morning from sleep at 2 a.m. with right hand tingling.  The patient seems to be a poor historian.  History she gave EMS was that she  had trouble speaking and right-sided weakness.  Code stroke was called en  route, as the time of onset was presumed to be when she woke up.  However,  the patient is not sure when she went to sleep.  She says she dosed off  sometime late last night after 10.  She did wake up to go to the restroom  once, but she does not remember exactly when.  She states she woke up at  about 2:00 with tingling in the right hand.  She also noticed headache which  she describes as generalized throbbing, 7/10 in severity, with some nausea  but no photophobia or phonophobia.  She denies prior history of migraine  headaches or similar tingling episodes.  She has, however, been hospitalized  at [facility] in April 2005, and she denies this episode.  She was  admitted to the stroke service at that time with some left-sided weakness.  Her symptoms persisted beyond admission for several days; CT scan was  unremarkable.  She will participate in the Saint-II stroke neuro protection  trial.  She, in fact, was transferred to rehab where she stayed for a week  before going home.  The patient interestingly does not remember this  hospitalization.  She had similar headache at that time.  She was not placed  on Aggrenox because of the headache, and __________were  added to her  aspirin.  The patient did not seek any neurological follow up since then.   PAST MEDICAL HISTORY:  1. Diabetes.  2. Hypertension.  3. Obesity.  4. Pulmonary embolism.   PAST SURGICAL HISTORY:  1. Multiple back surgeries.  2. Hysterectomy.   HOME MEDICATIONS:  1. Cipro 500 twice daily.  2. Etodolac 500 twice daily.  3. Hydrocodone/Tylenol 7.5/750 daily.   MEDICATION ALLERGIES:  None known.   SOCIAL HISTORY:  The patient is disabled from her back pain.  She lives in  [place].  She does not smoke or drink.  She lives with her husband.   REVIEW OF SYSTEMS:  Not significant for any chest pain, fever, cough,  shortness of breath, or diarrhea.   PHYSICAL EXAM:  GENERAL:  Obese African American lady who appears not to be  in distress.  VITAL SIGNS:  She is afebrile, temperature 98.6, pulse rate 81 per minute,  regular.  Respiratory rate 26 per minute, blood pressure 157/94 right upper  extremity, saturations 99% on 2 liters.  HEAD:  Nontraumatic.  NECK:  Supple without bruit.  ENT:  Unremarkable.  CARDIAC:  No murmur or gallop.  LUNGS:  Clear to auscultation.  ABDOMEN:  Soft, nontender.  NEUROLOGICAL EXAM:  The patient is awake, alert, oriented x3.  There is no  aphasia, apraxia, or dysarthria.  Her speech is slow and hesitant, but she  is able to name objects.  She can repeat very well, and comprehension is  good.  Eye movements are full range without nystagmus.  She has full visual  fields.  Face is slightly asymmetric, but there seems to be some deliberate  decreased right nasolabial fold.  This is not consistent when her attention  is diverted.  Motor system exam reveals no upper or lower extremity drift;  however, right-sided movements seem to be quite deliberate compared to the  left.  She is able to hold tone against gravity bilaterally.  Deep tendon  reflexes are 2+ symmetric.  Plantars are downgoing.  Ankle jerks are  depressed.  There is no sensory  loss.  The patient's gait was not tested.  Coordination is slow but accurate on the right side.   DATA REVIEWED:  Previous discharge summary from April 2005.  The patient was  admitted for presumed right brain stroke, but CT scan and MRI both did not  show a definite stroke.  Today's CT scan of the head reveals no evidence of  either an acute or old strokes either.  Admission labs are pending at this  time.   IMPRESSION:  A 60 year old lady with varying symptomatology.  The emergency  medical services thought she had aphasia and right hemiparesis, but she  seems to be more complaining about right hand tingling and headache to me  today.  Her neurological exam is not very consistent and shows some  subjective right-sided weakness which does not seem to be organic in nature.  She has been previously hospitalized for a presumed right brain stroke, but  MRI scan did not show definite evidence of a stroke at that time.  I think  that maybe neurological exam may be nonorganic in nature.  Anyway, the  patient does have significant risk factors for strokes, and hence it would  be prudent to get an MRI scan of the brain.  If is positive for a stroke,  she needs to be admitted for further workup.  If it is negative, she may be  discharged home and advised to take medications for diabetes, blood  pressure, and antiplatelet therapy, all of which she has discontinued.  She  will follow up with her primary physician, Dr. Jason Fila, in [place] in the  future as needed.  Thank you for the referral.           ______________________________  Sunny Schlein. Pearlean Brownie, MD     PPS/MEDQ  D:  07/27/2006  T:  07/27/2006  Job:  [id number]

## 2011-05-04 NOTE — Discharge Summary (Signed)
Julie Acevedo, Julie Acevedo                             ACCOUNT NO.:  [id number]   MEDICAL RECORD NO.:  [id number]                   PATIENT TYPE:  INP   LOCATION:  3114                                 FACILITY:  [facility]   PHYSICIAN:  Pramod P. Pearlean Brownie, MD                 DATE OF BIRTH:  11-15-1951   DATE OF ADMISSION:  03/23/2004  DATE OF DISCHARGE:  03/28/2004                                 DISCHARGE SUMMARY   DISCHARGE DIAGNOSES:  1. 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  2. Diabetes.  3. Hypertension.  4. Obesity.  5. Status post hysterectomy.  6. Status post lumbosacral spinal surgery x2.  7. History of pulmonary embolus.  8. History of recent pneumonia.   DISCHARGE MEDICATIONS:  1. Plavix 75 mg daily.  2. Aspirin 325 mg daily.  3. Zocor 10 mg daily.  4. Lantus insulin 20 units subcu q.h.s.  5. Amaryl 4 mg daily.  6. Actos 45 mg daily.  7. Glucophage 500 mg b.i.d.  8. Norvasc 10 mg daily.  9. Flexeril 10 mg t.i.d.  10.      Hydrochlorothiazide 25 mg daily.  11.      Labetalol 200 mg b.i.d.   STUDIES PERFORMED:  1. CT of the head on admission was normal.  2. Repeat CT of the head with progression of stroke was unchanged and     remained normal.  3. Chest x-ray no active disease.  4. CT of the head 24 hours after TPA showed no significant abnormality.  5. Repeat chest x-ray stable without acute cardiopulmonary process.  6. CT of the head 72 hours after admission shows right maxillary sinusitis     with possible tiny lacune along the inferior aspect of the left     lenticuloform nucleus though is not felt to represent an acute finding.  7. MRI of brain shows no acute infarct.  Minimal nonspecific white matter     type changes, minimal paranasal sinus disease.  8. MRA of the brain shows decreased visualization in the right middle     cerebral artery compared to the left.  9. MRA of the neck shows no significant stenosis.  10.      EKG performed x3.  11.      Transthoracic echocardiogram showed EF of 55-65%.  No left     ventricular regional wall abnormalities.  No cardioembolic source.  12.      EEG showed intermittent right frontal slowing epileptiform activity     seen.  13.      Carotid Doppler was normal.   LABORATORY DATA:  Hemoglobin 13.0, hematocrit 38.5, white blood cell 12.6  and platelets 357.  Differential was normal.  Coagulation studies were  normal.  Chemistry was sodium 137, potassium 3.4, chloride 106, glucose 315,  creatinine 0.7.  Glucose ranged from  the upper 100s to the mid 300s during  this admission.  Liver functions were normal with AST 18, ALT 23, alkaline  phosphatase slightly up at 124, total bilirubin 0.6.  Homocystine normal  9.13.  Cholesterol 180, triglycerides 116, HDL 56, LDL 101.  There is also a  documented phenobarbital level of less than 5.0 __________particular  patient.   HISTORY OF PRESENT ILLNESS:  Julie Acevedo is a 60 year old left-handed  black female who has a history of diabetes, obesity and hypertension.  She  had a recent bout of pneumonia for which she was admitted to [facility] about one week ago.  She has been recovering at home  with complaints of ongoing shortness of breath, fatigue and being tired.  This morning around 9:30 a.m. she noticed onset of left-sided weakness and  speech problems.  The patient called EMS and by the time EMS arrived her  deficits had cleared.  She refused to transport to the emergency room, but  while EMS was there her symptoms returned and about 10:30 she was brought to  the emergency room with left-sided hemiparesis, mild aphasia and headache.  The patient also reports numbness on the left side.  CT of the head done in  the emergency room was unremarkable.  It appears that she will be a TPA and  SAINT II candidate.  The patient admitted for further evaluation.   The patient did receive TPA in the  emergency room followed by enrollment in  the SAINT II study.  SAINT II is an ongoing clinical trial looking at drug  an [id number] for its neuroprotective qualities.  The patient will be enrolled  in this study for a period of 90 days from onset of stroke.   MRI was unrevealing for acute infarct though hemiparesis persisted  throughout the hospitalization.  In the emergency room, the patient became  lethargic with increasing lethargy throughout her emergency department stay.  NIH stroke scale progressed from 13 on admission up to 220.  Repeat CT of  the head showed no acute changes and it was felt that this lethargy was part  of her ongoing stroke syndrome.   The patient remained stable after the first 24 hours of admission with CT  showing no hemorrhage after TPA.  Her mild left hemiparesis remained and  therapy saw her for evaluation of gait and upper extremity movement.  They  agreed that she would benefit from a short rehab stay and plans were made to  transfer her to rehab.   Study drug infusion for 72 hours and on April 8 the patient lost IV access  in bilateral arms and a central line was placed by Dr. Shan Levans.  Drug  continued until its completion at 72 hours.   Other risk factors identified during the hospitalization included elevated  LDL for which she was started on aspirin.  The patient was on aspirin prior  to admission.  Consideration was made to put her on Aggrenox, but with  ongoing  headache since admission Plavix was added to current aspirin instead of  changing to Aggrenox.  Headache was felt to be part of a stroke syndrome and  had resolved by time of discharge.  With unrevealing MRI and stroke-like  symptoms an EEG was completed to evaluate for potential seizure activity for  which there was none.      Annie Main, N.P.  Pramod P. Pearlean Brownie, MD    SB/MEDQ  D:  04/07/2004  T:  04/09/2004  Job:  [id number]   cc:   Wilfrid Lund [address]  [place]  [place] [address]  Fax: [phone]

## 2011-05-04 NOTE — H&P (Signed)
[name], [name] NO.:  [id number]   MEDICAL RECORD NO.:  [id number]                   PATIENT TYPE:  INP   LOCATION:  3114                                 FACILITY:  [facility]   PHYSICIAN:  Marlan Palau, M.D.               DATE OF BIRTH:  04/20/1951   DATE OF ADMISSION:  03/23/2004  DATE OF DISCHARGE:                                HISTORY & PHYSICAL   HISTORY OF PRESENT ILLNESS:  Julie Acevedo is a 60 year old left-handed black  female born on 1951/10/28 with a history of diabetes, obesity and  hypertension.  The patient has had a recent bout of pneumonia treated in  [facility] a week ago.  The patient has been  recovering at home, complaining of some ongoing troubles with shortness of  breath, fatigue and being tired.  The patient, however, today around 9:30  a.m. noted the onset of left-sided weakness and speech problems.  The  patient called EMS and by the time EMS had arrived the patient had cleared  with her deficit.  The patient initially refused to go to the emergency room  but the problem recurred at 10:30 a.m. and EMS was called back.  The patient  was brought to the emergency room with left-sided hemiparesis, mild aphasia  and headache.  The patient reports numbness on the left side.  CT scan of  the head done through the emergency room was unremarkable.  The patient has  been seen on an urgent basis for further evaluation.   PAST MEDICAL HISTORY:  1. New onset of left hemiparesis and mild aphasia.  2. Obesity.  3. Diabetes.  4. Hypertension.  5. Hysterectomy possibly in the past, according to the husband.  6. Lumbosacral spine surgery times two in the past, last in 2003 by Dr.     Delila Pereyra.  7. History of pulmonary embolus.  8. History of recent pneumonia.   MEDICATIONS:  1. Norvasc possibly a 10 mg tablet daily.  2. Lantus insulin subcu q.h.s.  3. Singulair.  4. Advair.   ALLERGIES:  THE PATIENT IS ALLERGIC  TO LOTREL AND SULFA DRUGS.   SOCIAL HISTORY:  The patient does not smoke or drink.  This patient lives in  the [place] area.  She is married and has 3 daughters who  are alive and well.  The patient is not employed.   FAMILY MEDICAL HISTORY:  Notable in that her mother died with pneumonia.  Father died with lung cancer.  The patient is an only child.   REVIEW OF SYSTEMS:  Notable for no recent fevers or chills.  The patient has  had some weakness in a generalized fashion with shortness of breath.  Complains of a headache today and some neck stiffness.  Possible blurring of  vision.  Denies chest pain or abdominal pain.  Had some urinary stress  incontinence in the past.  Does note some left-sided numbness.  Denies  dizziness or black-outs.   PHYSICAL EXAMINATION:  VITAL SIGNS:  Blood pressure currently is 148/76,  heart rate is 75, respiratory rate is 18 and temperature is afebrile.  GENERAL:  This patient is a markedly obese black female who is somewhat  sleepy at this time.  Will alert and verbalize.  HEENT:  Head is atraumatic.  Eyes - pupils are round and reactive to light;  disks are soft and flat bilaterally.  NECK:  Supple; no carotid bruits noted.  RESPIRATORY:  Examination is clear.  CARDIOVASCULAR:  Examination reveals distant heart sounds, no obvious  murmurs or rubs noted.  ABDOMEN:  Reveals an obese abdomen; no organomegaly or tenderness noted.  EXTREMITIES:  Reveal 1+ edema at the ankles.  NEUROLOGIC:  Cranial nerves as above.  The patient has obvious asymmetry  with smile, decreased left lower face.  Extraocular movements are full.  The  patient appears to have full visual fields at this time, although initially  seemed to have some trouble picking up on objects in the left lower  quadrant.  The patient has mild aphasia, slowness of thinking and some  difficulty with naming.  The patient has a left hemiparesis with antigravity  movement of the left  arm and left leg but drift of the left arm and left leg  is present.  Right side strength is normal.  The patient notes decreased  pinprick and sensation to the left face, left arm and left leg as compared  to the right.  Sensation is mildly depressed on the left arm and leg.  The  patient has no obvious ataxia with the right arm or right leg.  The patient  is unable to perform finger-to-nose-to-finger and heel-to-shin with the left  side.  The patient could not be ambulated.  Deep tendon reflexes are present  throughout.  Toes were neutral bilaterally.   LABORATORY DATA:  Notable for an INR of 0.9.  The patient has a white count  of 12.6, hemoglobin of 13.0, hematocrit of 38.5, MCV of 82.4 and platelets  of 357.  The patient has a glucose of 315, BUN of 9, sodium of 137,  potassium of 3.4, chloride of 106, CO2 of 24 and creatinine of 0.7.  Her pH  is 7.429.   EKG reveals normal sinus rhythm, normal EKG with heart rate of 75.   CT scan of the head is as above.   IMPRESSION:  1. 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  2. Diabetes.  3. Hypertension.  4. Obesity.   The patient currently is evolving a stroke involving the right brain with  undulating features initially.  Suspect a thrombotic process.  The patient  is complaining of a significant headache.  Do need to also consider and rule  out the possibility of a carotid dissection.   PLAN:  1. The patient is a candidate for t-PA; this will be administered.  2. Possible __________ trial drug administration.  3. Admission to [facility] intensive care unit.  4. MRI scan of the brain.  5. MRI angiogram of intracranial and extracranial vessels.  6. 2-D echocardiogram.  7. Physical, occupational and speech therapy evaluation.  8. N.p.o. for now.  9. CT scan of the head 24 hours after t-PA administration.  10.      Will follow the patient's course while in house. 11.  The patient  will receive IV fluid hydration.                                                Marlan Palau, M.D.    CKW/MEDQ  D:  03/23/2004  T:  03/23/2004  Job:  [id number]   cc:   Talmage Coin, M.D.

## 2011-05-31 ENCOUNTER — Inpatient Hospital Stay (HOSPITAL_COMMUNITY): Admit: 2011-05-31 | Payer: Self-pay | Admitting: General Surgery

## 2011-06-19 ENCOUNTER — Emergency Department: Payer: Self-pay | Admitting: Emergency Medicine

## 2011-07-23 ENCOUNTER — Ambulatory Visit: Payer: Self-pay | Admitting: Family Medicine

## 2011-08-19 ENCOUNTER — Ambulatory Visit: Payer: Self-pay | Admitting: Rheumatology

## 2011-09-02 IMAGING — CR DG CHEST 1V PORT
1 series · 1 of 1 positions shown · non-contrast
Comparison: 07/27/2006

CLINICAL DATA: Chest pain

PORTABLE CHEST - 1 VIEW

[view not recorded]
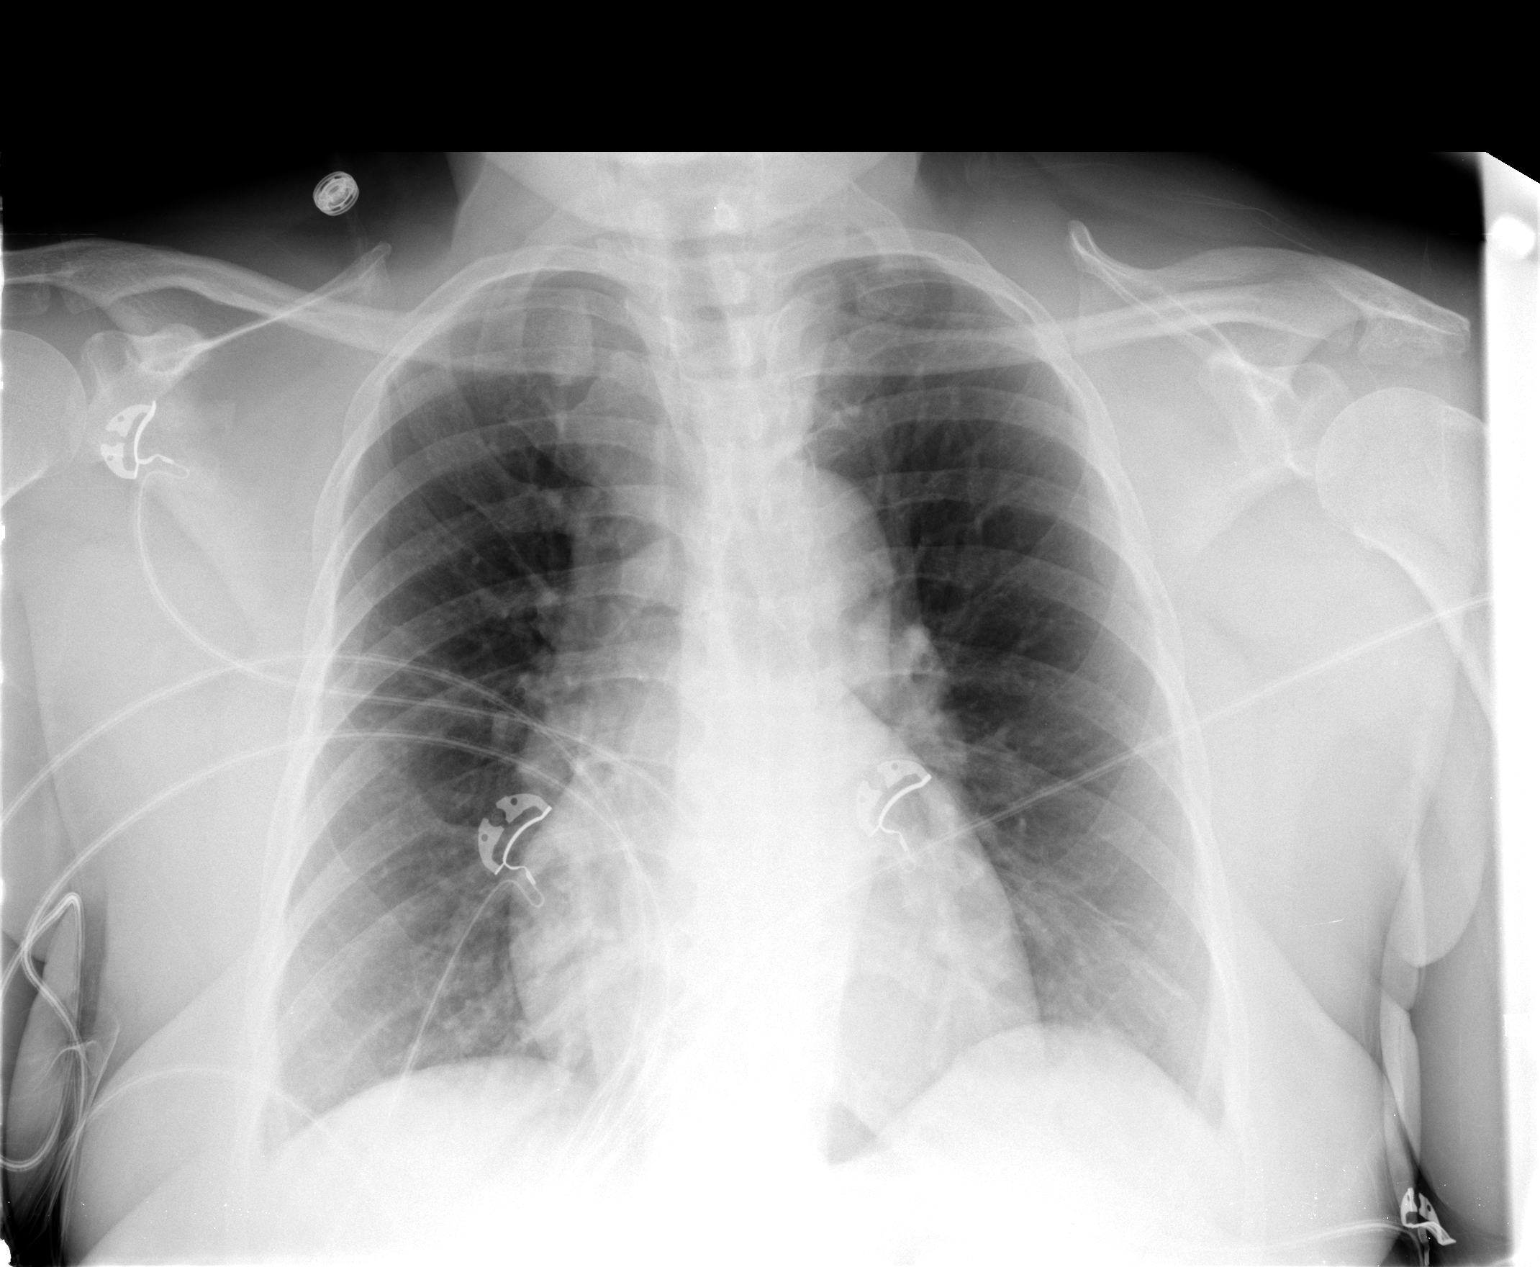

[1 of 1 positions shown; findings below may reference images not displayed]

FINDINGS: Heart is upper normal in size.  Lungs are clear.  No
pneumothorax and no pleural effusion.
IMPRESSION: No active cardiopulmonary disease.

## 2011-10-30 IMAGING — CT CT ABD-PELV W/ CM
1 of 2 series · 16 of 32 positions shown, 20 images · non-contrast
Comparison: none

REASON FOR EXAM: Abd Mass
COMMENTS:

PROCEDURE:     CT  - CT ABDOMEN / PELVIS  W  - October 25, 2009  [DATE]
RESULT:
HISTORY: Abdominal mass.

[Series 2: abdomen · axial · 0.79mm/px · z∈[-137,+243]mm · 16 of 84 slices shown, 20 images]
[im 4/84  soft-tissue]
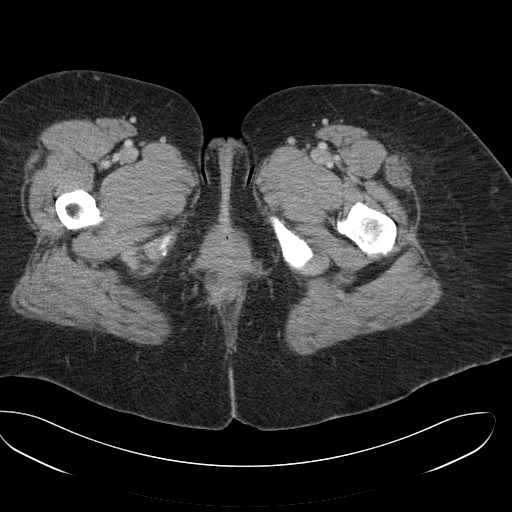
[im 4/84  bone]
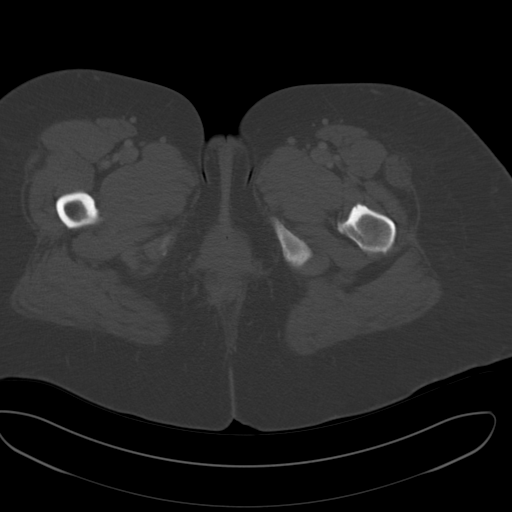
[im 11/84  soft-tissue]
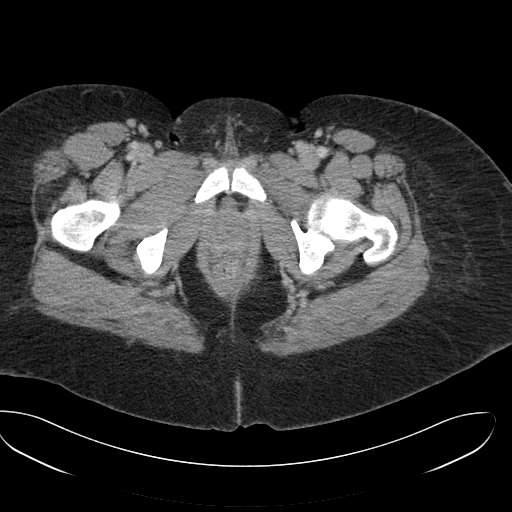
[im 18/84  soft-tissue]
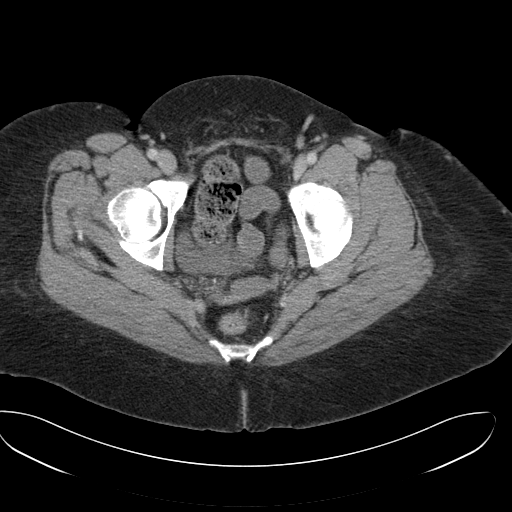
[im 21/84  soft-tissue]
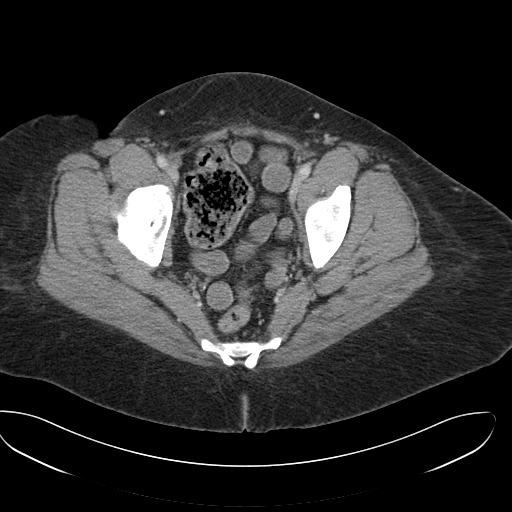
[im 28/84  soft-tissue]
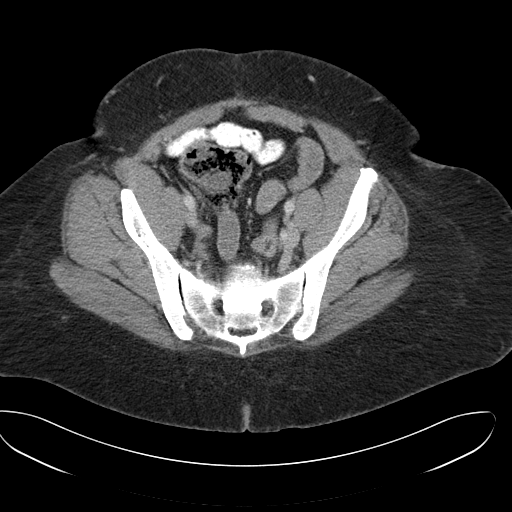
[im 35/84  soft-tissue]
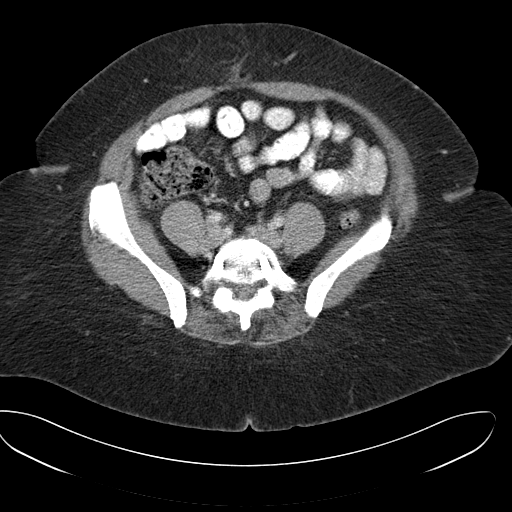
[im 39/84  soft-tissue]
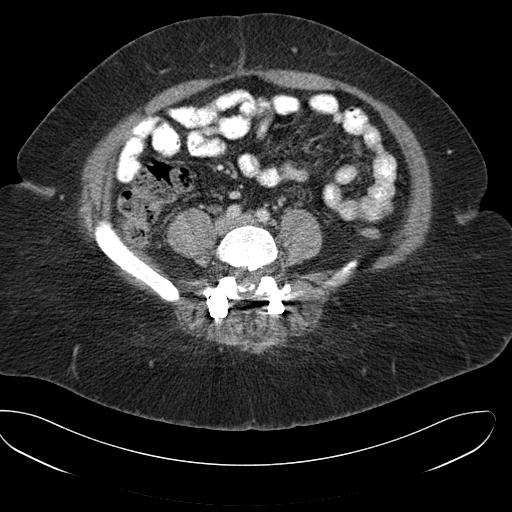
[im 45/84  soft-tissue]
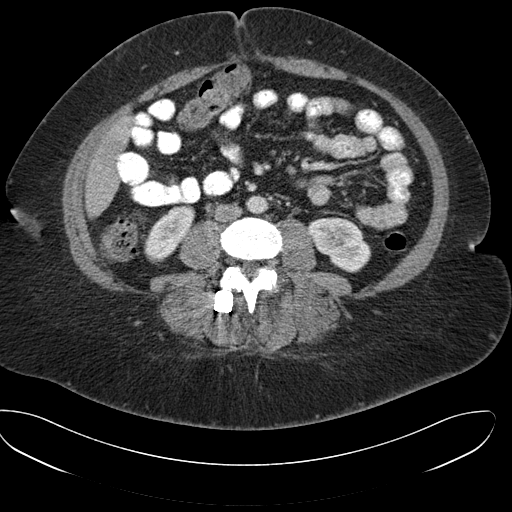
[im 49/84  soft-tissue]
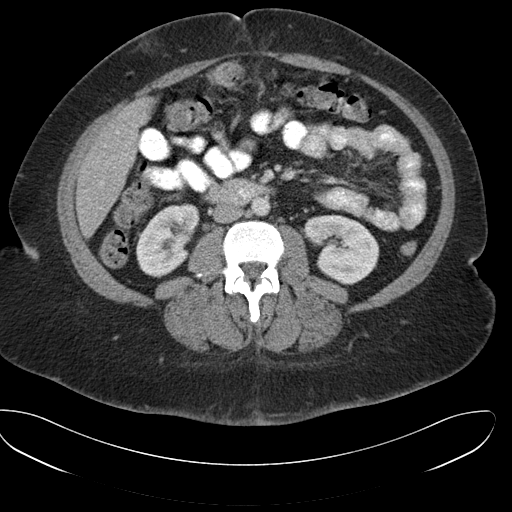
[im 49/84  bone]
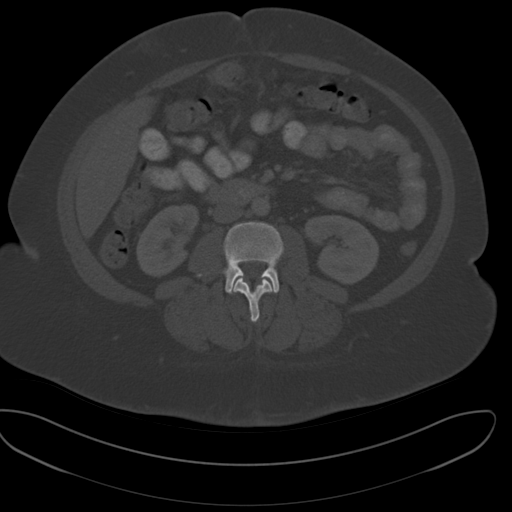
[im 56/84  soft-tissue]
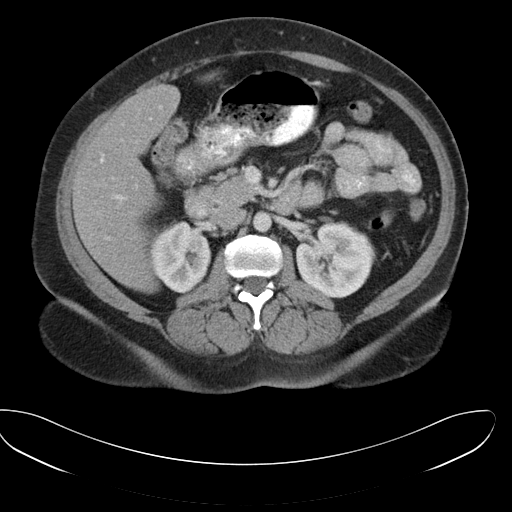
[im 63/84  soft-tissue]
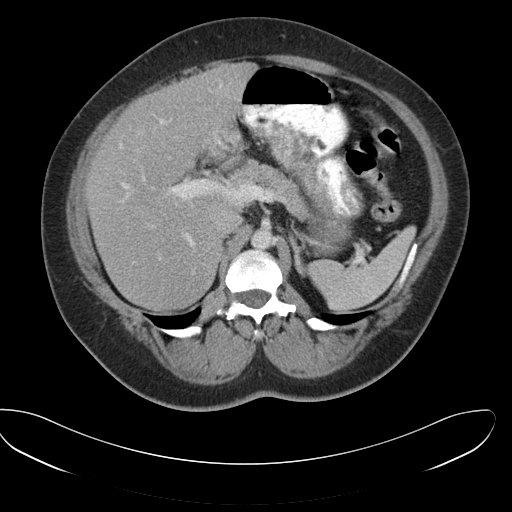
[im 66/84  soft-tissue]
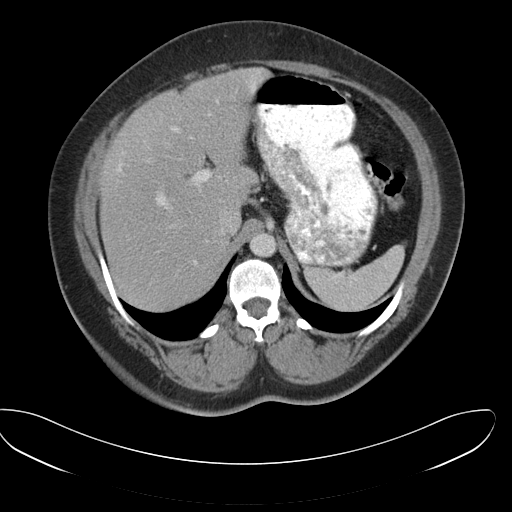
[im 70/84  lung]
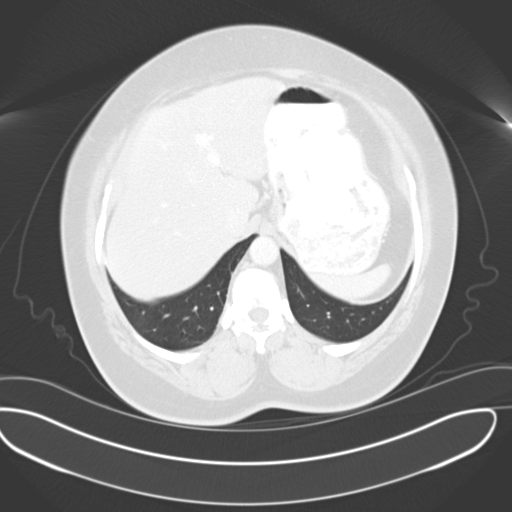
[im 73/84  soft-tissue]
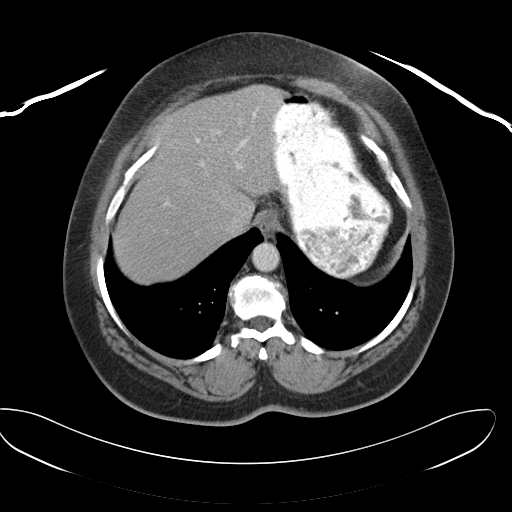
[im 73/84  lung]
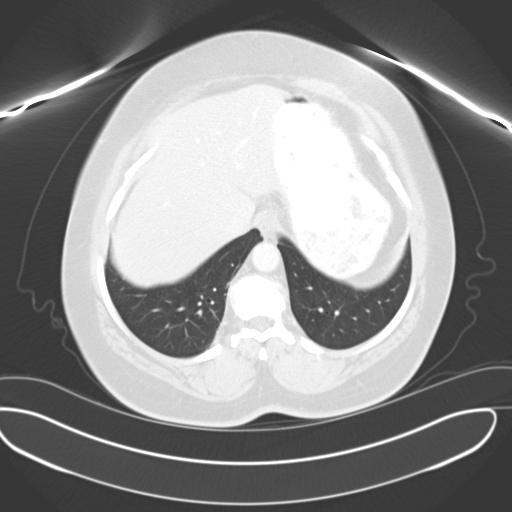
[im 77/84  lung]
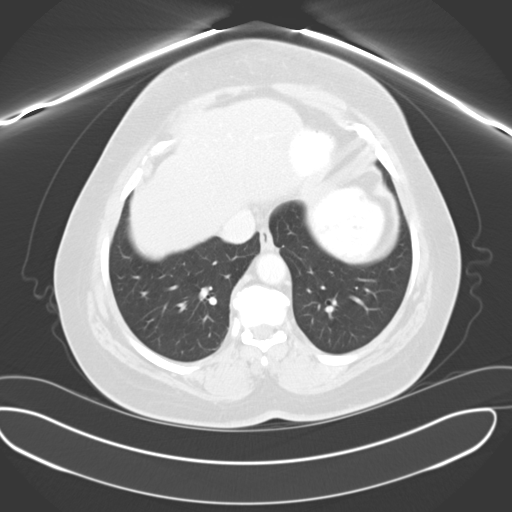
[im 80/84  soft-tissue]
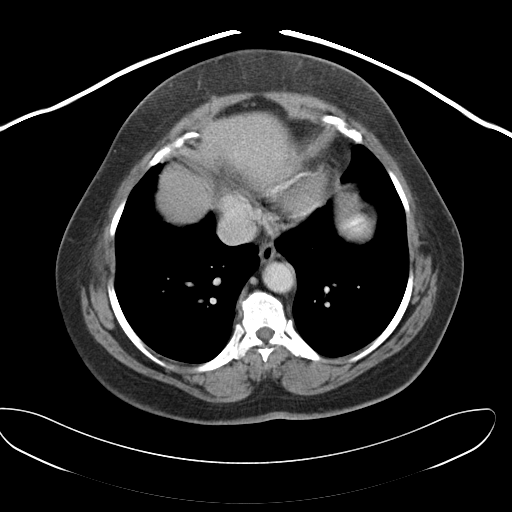
[im 80/84  lung]
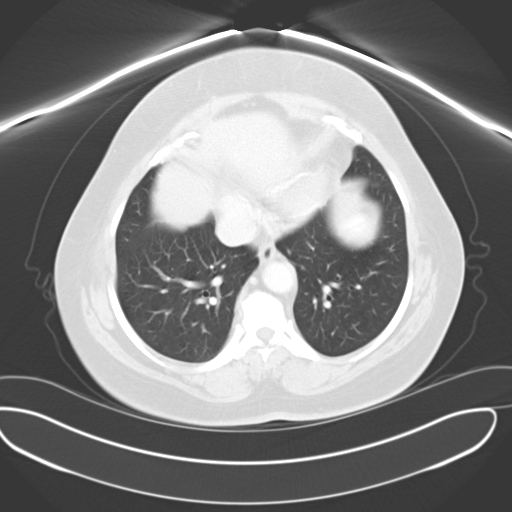

[16 of 32 positions shown; findings below may reference images not displayed]

PROCEDURE AND FINDINGS:   Standard IV contrast enhanced CT of the abdomen
and pelvis was obtained following administration of 100 mL of 1sovue-71D.
The liver and spleen are normal. The pancreas is normal. Adrenals are
normal. No focal renal abnormality is identified. The patient has had a
prior cholecystectomy. No bowel distention is noted. Right lower quadrant is
unremarkable. Innumerable phleboliths are noted. The bladder is
nondistended. I cannot exclude nonobstructing distal ureteral stone.
Calcifications noted in the pelvis are most likely phleboliths. No free air
is noted.
IMPRESSION: No acute abnormality.

## 2011-10-31 IMAGING — CT CT NECK WITH CONTRAST
1 of 2 series · 9 of 14 positions shown, 12 images · IV contrast (agent unspecified)
Comparison: None

REASON FOR EXAM: soft tissue swelling since a.m.
COMMENTS:

PROCEDURE:     CT  - CT NECK WITH CONTRAST  - October 26, 2009  [DATE]
RESULT:     Indication: Soft tissue swelling
TECHNIQUE: Multiple sequential axial images from the apices of the lungs to
the level of the orbits obtained with 60 mL of 4sovue-9A0 IV contrast.

[Series 2: soft tissue · axial · 0.57mm/px · z∈[+82,+300]mm · 9 of 93 slices shown, 12 images]
[im 10/93  soft-tissue]
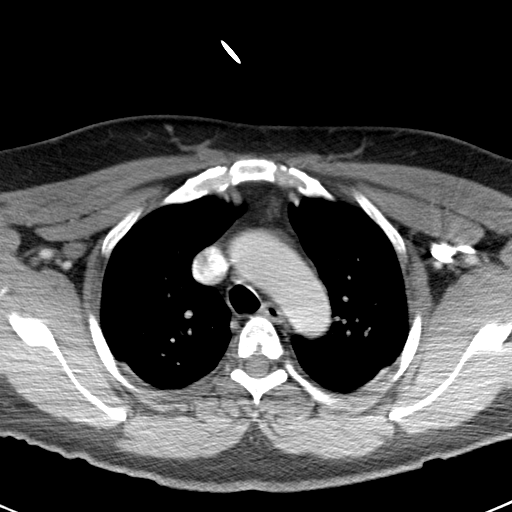
[im 10/93  bone]
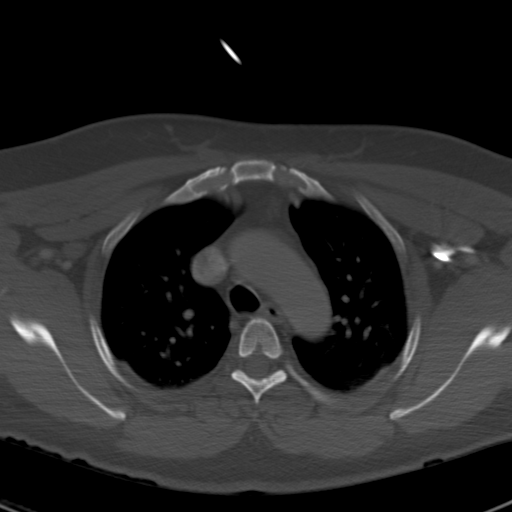
[im 19/93  bone]
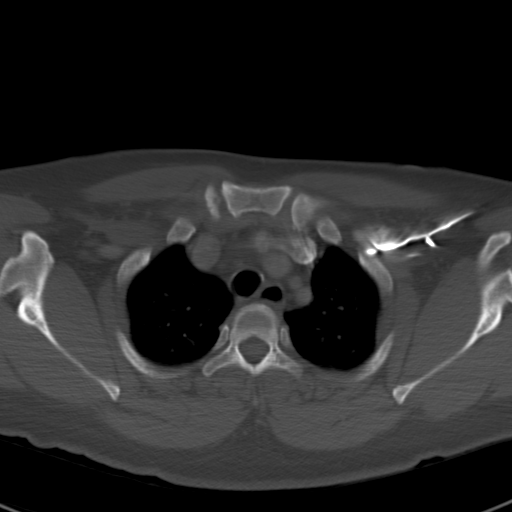
[im 28/93  bone]
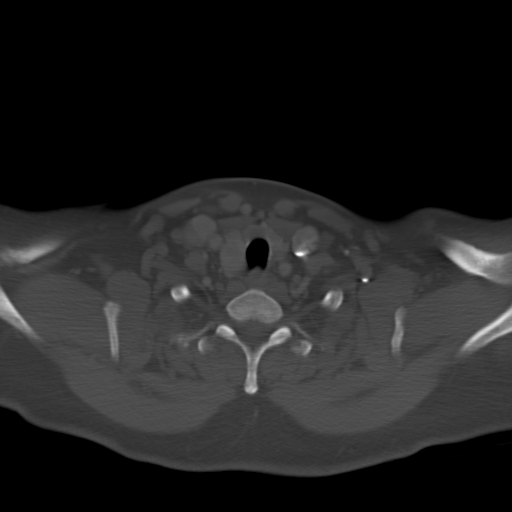
[im 37/93  bone]
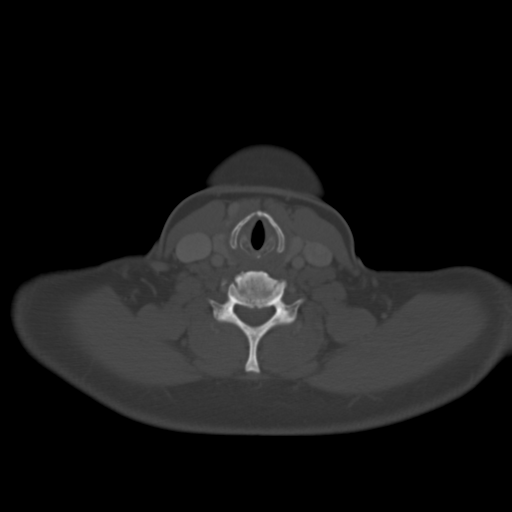
[im 47/93  soft-tissue]
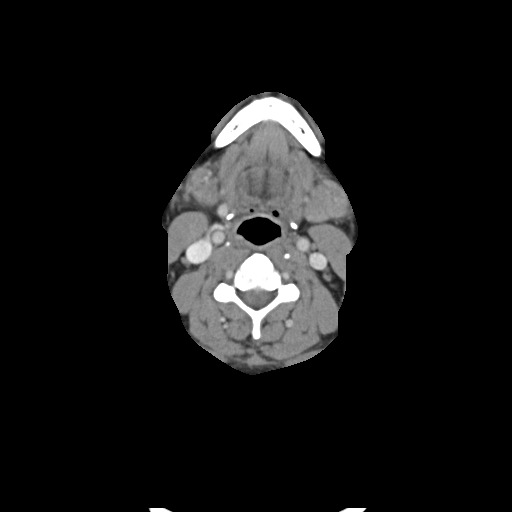
[im 47/93  bone]
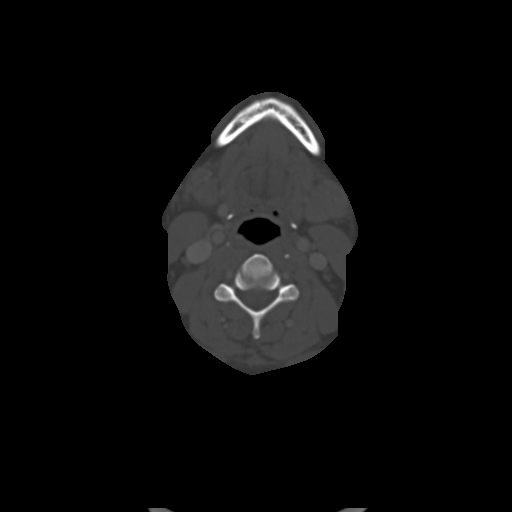
[im 56/93  bone]
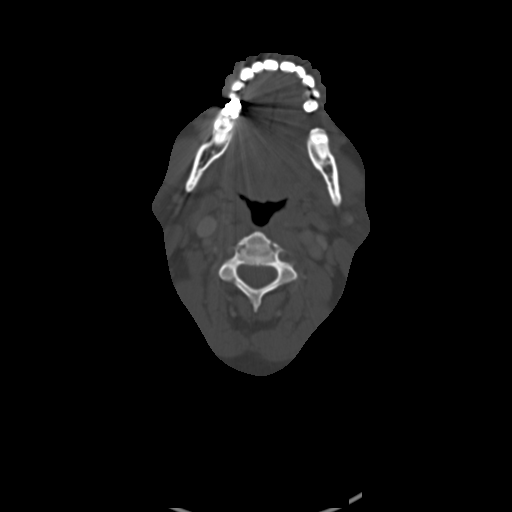
[im 65/93  bone]
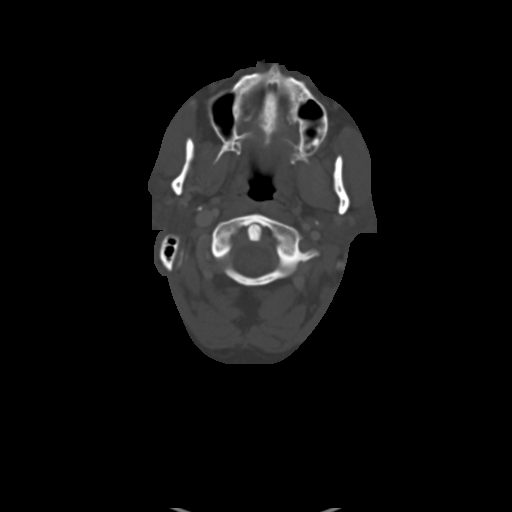
[im 74/93  bone]
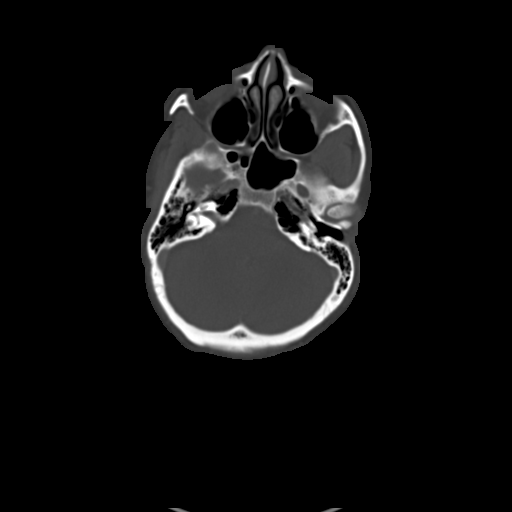
[im 83/93  soft-tissue]
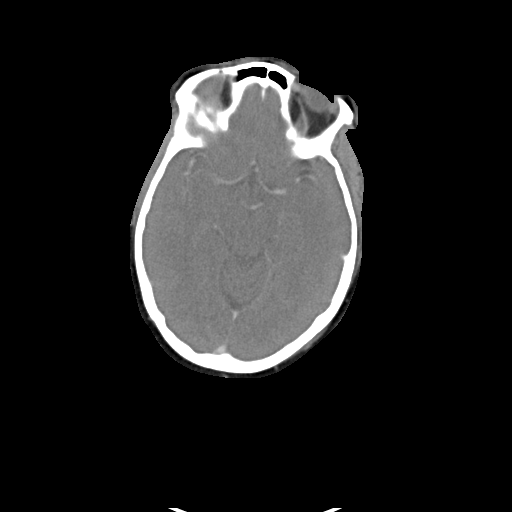
[im 83/93  bone]
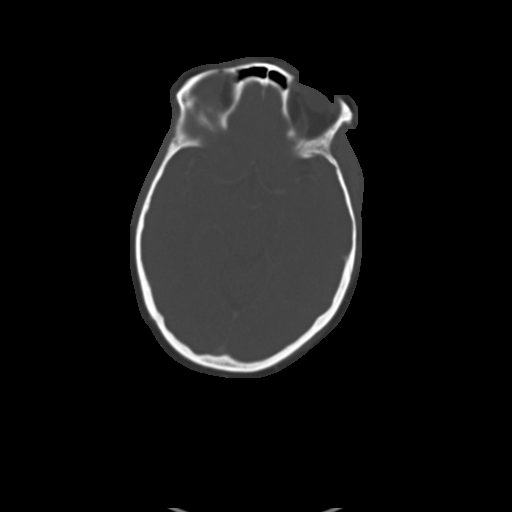

[9 of 14 positions shown; findings below may reference images not displayed]

FINDINGS: There is no lymphadenopathy. No masses are identified, and there
is no compression or impingement on the airway. Grossly the vascular
structures are normal. The submandibular glands are I laterally symmetric.
There is no focal fluid collection to suggest an abscess. The parotid glands
are normal.

The imaged intracranial structures are normal. There is no evidence for
space-occupying lesion or intracranial hemorrhage. There is no evidence for
cortical-based area of infarction, mass effect, or extra-axial fluid
collections.  Ventricles and sulci are appropriate for the patient's age and
the basal cisterns are patent.

Visualized portions of the orbits and paranasal sinuses are unremarkable.
IMPRESSION: Normal CT of the neck.

## 2011-10-31 IMAGING — CR DG CHEST 2V
1 series · 2 of 2 positions shown · non-contrast
Comparison: none

REASON FOR EXAM: SOB
COMMENTS:

[Series 1: view not recorded · 0.17mm/px · 2 of 2 slices shown]
[im 1/2]
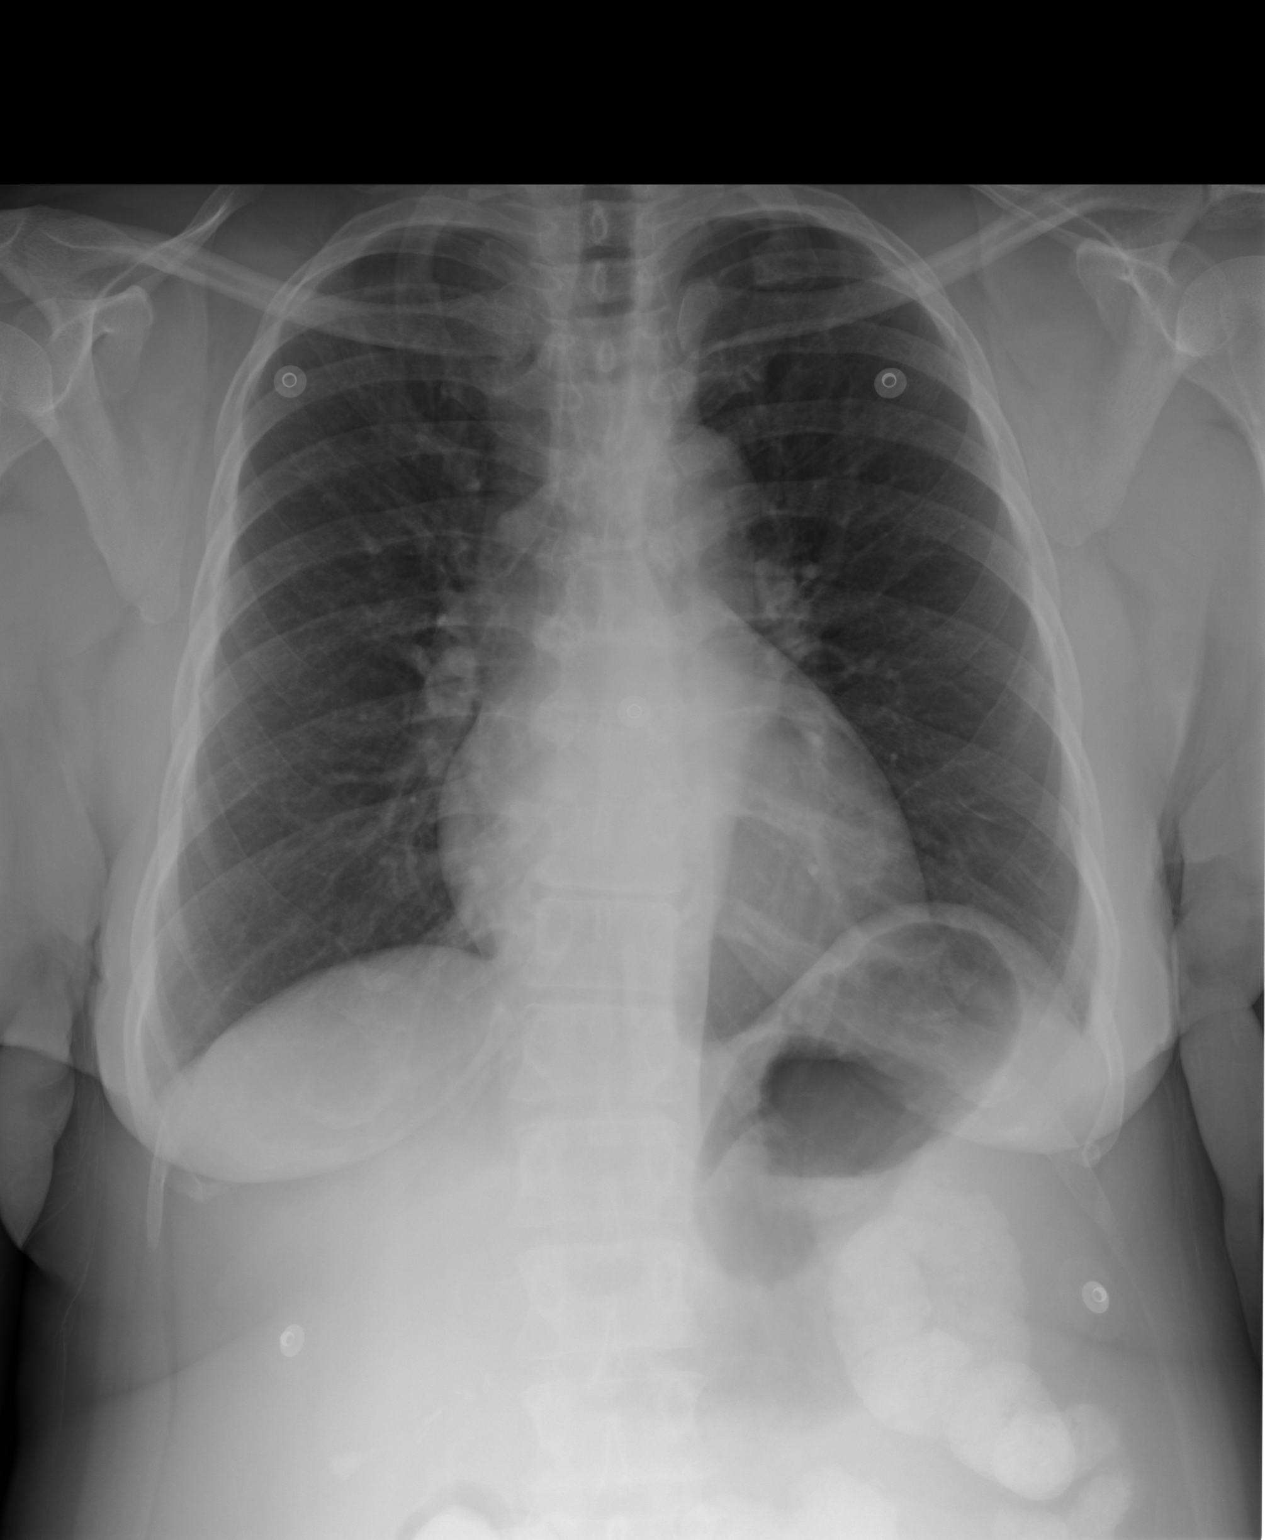
[im 2/2]
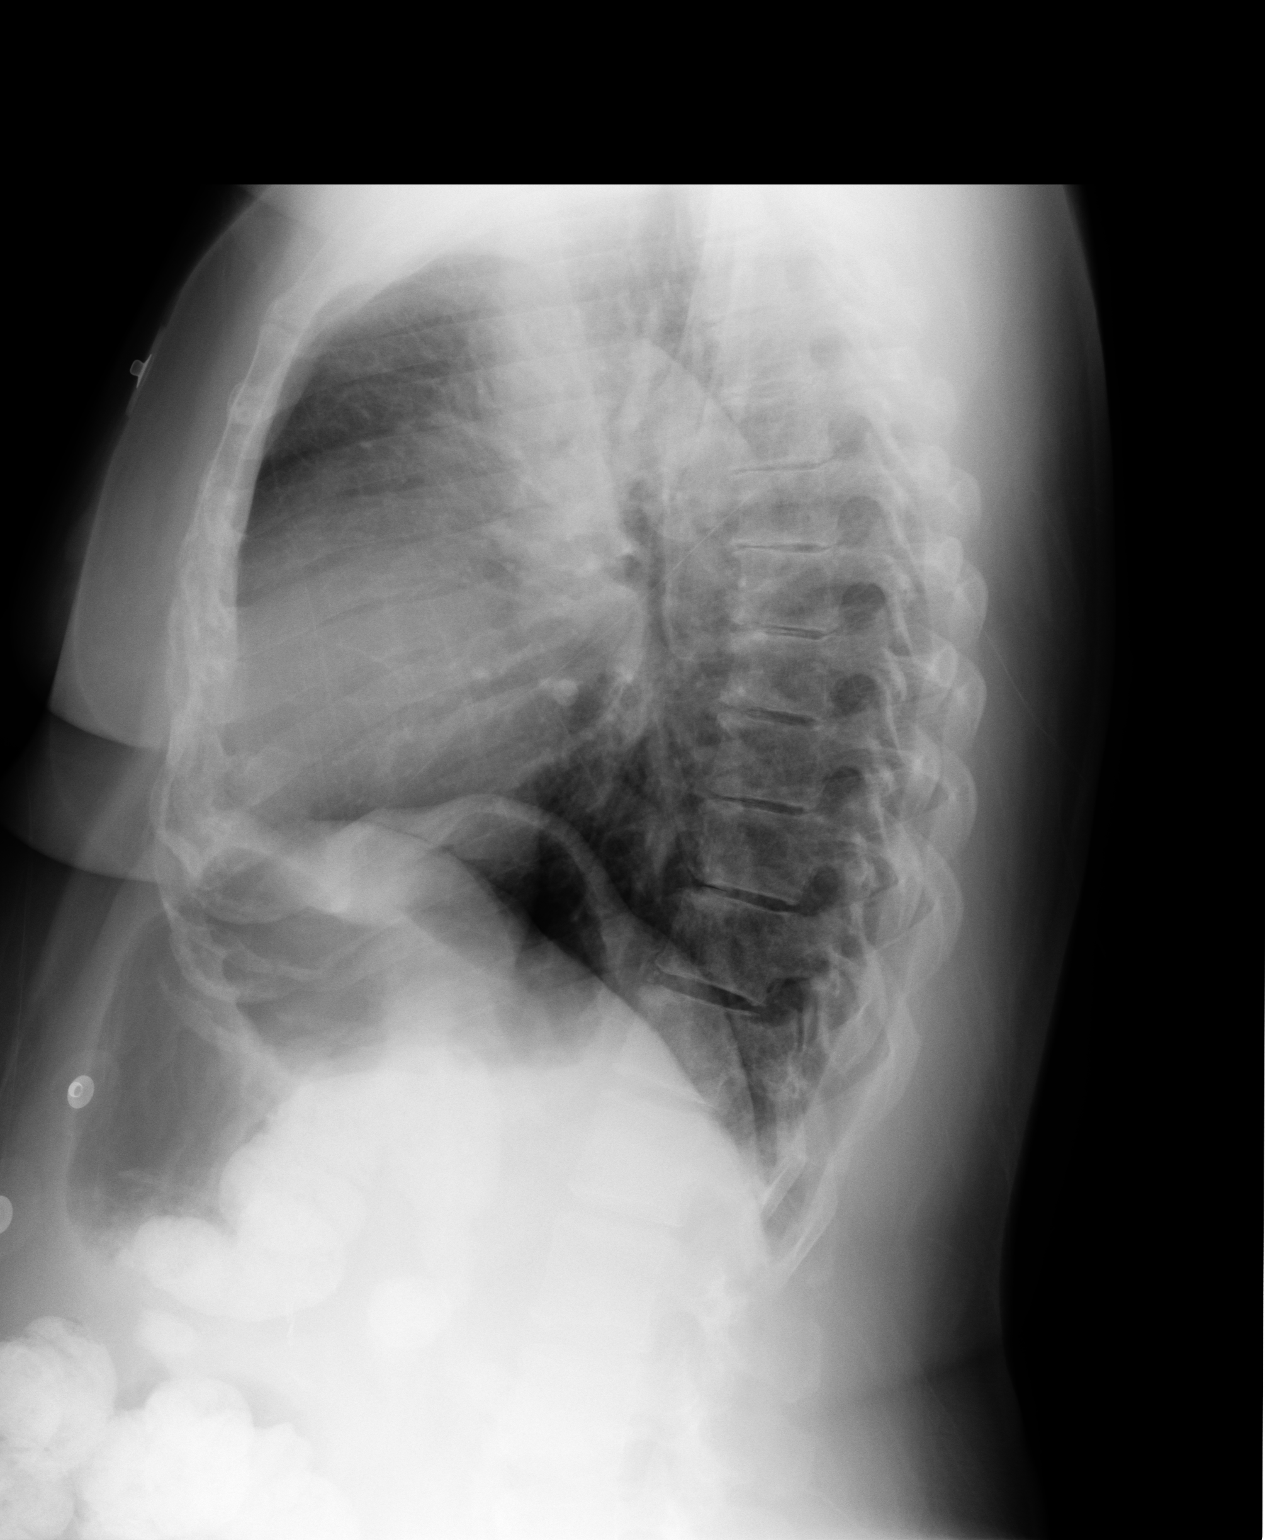

[2 of 2 positions shown; findings below may reference images not displayed]

PROCEDURE:     DXR - DXR CHEST PA (OR AP) AND LATERAL  - October 26, 2009  [DATE]

RESULT:

PA and lateral views of the chest show the lung fields to be clear.  No
pneumonia, pneumothorax or pleural effusion is seen.  No lung mass is noted.
 No hilar adenopathy is observed. No acute bony abnormalities are
identified.
IMPRESSION: No acute changes are identified.

## 2012-01-27 ENCOUNTER — Emergency Department (HOSPITAL_COMMUNITY): Payer: Medicare PPO

## 2012-01-27 ENCOUNTER — Encounter (HOSPITAL_COMMUNITY): Payer: Self-pay | Admitting: Cardiology

## 2012-01-27 ENCOUNTER — Observation Stay (HOSPITAL_COMMUNITY)
Admission: EM | Admit: 2012-01-27 | Discharge: 2012-01-28 | Disposition: A | Discharge status: Home / Self Care | Payer: Medicare PPO | Attending: Cardiology | Admitting: Cardiology

## 2012-01-27 DIAGNOSIS — E119 Type 2 diabetes mellitus without complications: Secondary | ICD-10-CM | POA: Insufficient documentation

## 2012-01-27 DIAGNOSIS — Z9884 Bariatric surgery status: Secondary | ICD-10-CM | POA: Insufficient documentation

## 2012-01-27 DIAGNOSIS — I252 Old myocardial infarction: Secondary | ICD-10-CM | POA: Insufficient documentation

## 2012-01-27 DIAGNOSIS — R079 Chest pain, unspecified: Secondary | ICD-10-CM

## 2012-01-27 DIAGNOSIS — R0789 Other chest pain: Principal | ICD-10-CM | POA: Insufficient documentation

## 2012-01-27 DIAGNOSIS — I10 Essential (primary) hypertension: Secondary | ICD-10-CM | POA: Diagnosis present

## 2012-01-27 DIAGNOSIS — R0602 Shortness of breath: Secondary | ICD-10-CM | POA: Insufficient documentation

## 2012-01-27 HISTORY — DX: Essential (primary) hypertension: I10

## 2012-01-27 HISTORY — DX: Type 2 diabetes mellitus without complications: E11.9

## 2012-01-27 HISTORY — DX: Diverticulitis of intestine, part unspecified, without perforation or abscess without bleeding: K57.92

## 2012-01-27 LAB — BASIC METABOLIC PANEL
BUN: 12 mg/dL (ref 6–23)
CO2: 27 mEq/L (ref 19–32)
Calcium: 9.5 mg/dL (ref 8.4–10.5)
Chloride: 109 mEq/L (ref 96–112)
Creatinine, Ser: 0.96 mg/dL (ref 0.50–1.10)
GFR calc Af Amer: 74 mL/min — ABNORMAL LOW (ref >90)
GFR calc non Af Amer: 64 mL/min — ABNORMAL LOW (ref >90)
Glucose, Bld: 116 mg/dL — ABNORMAL HIGH (ref 70–99)
Potassium: 3.7 mEq/L (ref 3.5–5.1)
Sodium: 142 mEq/L (ref 135–145)

## 2012-01-27 LAB — CBC
HCT: 37 % (ref 36.0–46.0)
HCT: 36.1 % (ref 36.0–46.0)
HCT: 35.4 % — ABNORMAL LOW (ref 36.0–46.0)
Hemoglobin: 12.6 g/dL (ref 12.0–15.0)
Hemoglobin: 11.9 g/dL — ABNORMAL LOW (ref 12.0–15.0)
Hemoglobin: 11.7 g/dL — ABNORMAL LOW (ref 12.0–15.0)
MCH: 28.8 pg (ref 26.0–34.0)
MCH: 28.4 pg (ref 26.0–34.0)
MCH: 28.7 pg (ref 26.0–34.0)
MCHC: 34.1 g/dL (ref 30.0–36.0)
MCHC: 33 g/dL (ref 30.0–36.0)
MCHC: 33.1 g/dL (ref 30.0–36.0)
MCV: 84.5 fL (ref 78.0–100.0)
MCV: 86.2 fL (ref 78.0–100.0)
MCV: 86.8 fL (ref 78.0–100.0)
Platelets: 350 10*3/uL (ref 150–400)
Platelets: 305 10*3/uL (ref 150–400)
Platelets: 291 10*3/uL (ref 150–400)
RBC: 4.38 MIL/uL (ref 3.87–5.11)
RBC: 4.19 MIL/uL (ref 3.87–5.11)
RBC: 4.08 MIL/uL (ref 3.87–5.11)
RDW: 13.6 % (ref 11.5–15.5)
RDW: 13.7 % (ref 11.5–15.5)
RDW: 13.8 % (ref 11.5–15.5)
WBC: 10 10*3/uL (ref 4.0–10.5)
WBC: 7.5 10*3/uL (ref 4.0–10.5)
WBC: 9.3 10*3/uL (ref 4.0–10.5)

## 2012-01-27 LAB — CREATININE, SERUM
Creatinine, Ser: 0.65 mg/dL (ref 0.50–1.10)
GFR calc Af Amer: >90 mL/min (ref >90)
GFR calc non Af Amer: >90 mL/min (ref >90)

## 2012-01-27 LAB — GLUCOSE, CAPILLARY
Glucose-Capillary: 100 mg/dL — ABNORMAL HIGH (ref 70–99)
Glucose-Capillary: 105 mg/dL — ABNORMAL HIGH (ref 70–99)
Glucose-Capillary: 114 mg/dL — ABNORMAL HIGH (ref 70–99)
Glucose-Capillary: 107 mg/dL — ABNORMAL HIGH (ref 70–99)

## 2012-01-27 LAB — COMPREHENSIVE METABOLIC PANEL
ALT: 15 U/L (ref 0–35)
AST: 19 U/L (ref 0–37)
Albumin: 3.8 g/dL (ref 3.5–5.2)
Alkaline Phosphatase: 126 U/L — ABNORMAL HIGH (ref 39–117)
BUN: 17 mg/dL (ref 6–23)
CO2: 27 mEq/L (ref 19–32)
Calcium: 10.1 mg/dL (ref 8.4–10.5)
Chloride: 102 mEq/L (ref 96–112)
Creatinine, Ser: 0.85 mg/dL (ref 0.50–1.10)
GFR calc Af Amer: 86 mL/min — ABNORMAL LOW (ref >90)
GFR calc non Af Amer: 74 mL/min — ABNORMAL LOW (ref >90)
Glucose, Bld: 111 mg/dL — ABNORMAL HIGH (ref 70–99)
Potassium: 2.9 mEq/L — ABNORMAL LOW (ref 3.5–5.1)
Sodium: 140 mEq/L (ref 135–145)
Total Bilirubin: 0.2 mg/dL — ABNORMAL LOW (ref 0.3–1.2)
Total Protein: 7.7 g/dL (ref 6.0–8.3)

## 2012-01-27 LAB — MAGNESIUM: Magnesium: 2.1 mg/dL (ref 1.5–2.5)

## 2012-01-27 LAB — TSH: TSH: 3.779 u[IU]/mL (ref 0.350–4.500)

## 2012-01-27 LAB — CARDIAC PANEL(CRET KIN+CKTOT+MB+TROPI)
CK, MB: 2.1 ng/mL (ref 0.3–4.0)
CK, MB: 2.1 ng/mL (ref 0.3–4.0)
CK, MB: 1.9 ng/mL (ref 0.3–4.0)
Relative Index: 1.8 (ref 0.0–2.5)
Relative Index: 1.7 (ref 0.0–2.5)
Relative Index: 1.6 (ref 0.0–2.5)
Total CK: 120 U/L (ref 7–177)
Total CK: 121 U/L (ref 7–177)
Total CK: 118 U/L (ref 7–177)
Troponin I: <0.3 ng/mL (ref <0.30)
Troponin I: <0.3 ng/mL (ref <0.30)
Troponin I: <0.3 ng/mL (ref <0.30)

## 2012-01-27 LAB — HEMOGLOBIN A1C
Hgb A1c MFr Bld: 6.9 % — ABNORMAL HIGH (ref <5.7)
Mean Plasma Glucose: 151 mg/dL — ABNORMAL HIGH (ref <117)

## 2012-01-27 LAB — POCT I-STAT TROPONIN I: Troponin i, poc: 0 ng/mL (ref 0.00–0.08)

## 2012-01-27 LAB — D-DIMER, QUANTITATIVE: D-Dimer, Quant: 0.4 ug/mL-FEU (ref 0.00–0.48)

## 2012-01-27 MED ORDER — ONDANSETRON HCL 4 MG/2ML IJ SOLN
4.0000 mg | Freq: Once | INTRAMUSCULAR | Status: AC
  Administered 2012-01-27: 4 mg via INTRAVENOUS
  Filled 2012-01-27: qty 2

## 2012-01-27 MED ORDER — INSULIN ASPART 100 UNIT/ML ~~LOC~~ SOLN
0.0000 [IU] | Freq: Three times a day (TID) | SUBCUTANEOUS | Status: DC
  Filled 2012-01-27: qty 3

## 2012-01-27 MED ORDER — POTASSIUM CHLORIDE CRYS ER 20 MEQ PO TBCR: 40.0000 meq | EXTENDED_RELEASE_TABLET | Freq: Once | ORAL | Status: DC

## 2012-01-27 MED ORDER — NITROGLYCERIN 0.4 MG SL SUBL
0.4000 mg | SUBLINGUAL_TABLET | SUBLINGUAL | Status: DC | PRN
Start: 2012-01-27 — End: 2012-01-28
  Administered 2012-01-27: 0.4 mg via SUBLINGUAL

## 2012-01-27 MED ORDER — ACETAMINOPHEN 325 MG PO TABS
650.0000 mg | ORAL_TABLET | ORAL | Status: DC | PRN
  Administered 2012-01-27: 650 mg via ORAL
  Filled 2012-01-27: qty 2

## 2012-01-27 MED ORDER — NITROGLYCERIN IN D5W 200-5 MCG/ML-% IV SOLN
5.0000 ug/min | INTRAVENOUS | Status: DC
  Filled 2012-01-27: qty 250

## 2012-01-27 MED ORDER — ENOXAPARIN SODIUM 40 MG/0.4ML ~~LOC~~ SOLN
40.0000 mg | SUBCUTANEOUS | Status: DC
  Administered 2012-01-27 – 2012-01-28 (×2): 40 mg via SUBCUTANEOUS
  Filled 2012-01-27 (×2): qty 0.4

## 2012-01-27 MED ORDER — ASPIRIN EC 81 MG PO TBEC
81.0000 mg | DELAYED_RELEASE_TABLET | Freq: Every day | ORAL | Status: DC
  Administered 2012-01-28: 81 mg via ORAL
  Filled 2012-01-27: qty 1

## 2012-01-27 MED ORDER — METOPROLOL TARTRATE 12.5 MG HALF TABLET
12.5000 mg | ORAL_TABLET | Freq: Two times a day (BID) | ORAL | Status: DC
  Administered 2012-01-27 – 2012-01-28 (×3): 12.5 mg via ORAL
  Filled 2012-01-27 (×4): qty 1

## 2012-01-27 MED ORDER — MORPHINE SULFATE 4 MG/ML IJ SOLN
4.0000 mg | Freq: Once | INTRAMUSCULAR | Status: AC
  Administered 2012-01-27: 4 mg via INTRAVENOUS
  Filled 2012-01-27: qty 1

## 2012-01-27 MED ORDER — MORPHINE SULFATE 2 MG/ML IJ SOLN
1.0000 mg | INTRAMUSCULAR | Status: DC | PRN
  Administered 2012-01-27: 2 mg via INTRAVENOUS
  Filled 2012-01-27: qty 1

## 2012-01-27 MED ORDER — HYDROCHLOROTHIAZIDE 25 MG PO TABS
25.0000 mg | ORAL_TABLET | Freq: Every day | ORAL | Status: DC
  Administered 2012-01-27 – 2012-01-28 (×2): 25 mg via ORAL
  Filled 2012-01-27 (×2): qty 1

## 2012-01-27 MED ORDER — AMLODIPINE BESYLATE 10 MG PO TABS
10.0000 mg | ORAL_TABLET | Freq: Every day | ORAL | Status: DC
  Administered 2012-01-27 – 2012-01-28 (×2): 10 mg via ORAL
  Filled 2012-01-27 (×2): qty 1

## 2012-01-27 MED ORDER — INSULIN ASPART 100 UNIT/ML ~~LOC~~ SOLN: 0.0000 [IU] | Freq: Every day | SUBCUTANEOUS | Status: DC

## 2012-01-27 MED ORDER — ONDANSETRON HCL 4 MG/2ML IJ SOLN
4.0000 mg | Freq: Four times a day (QID) | INTRAMUSCULAR | Status: DC | PRN
  Administered 2012-01-27 (×2): 4 mg via INTRAVENOUS
  Filled 2012-01-27 (×2): qty 2

## 2012-01-27 MED ORDER — ASPIRIN 81 MG PO CHEW: 324.0000 mg | CHEWABLE_TABLET | Freq: Once | ORAL | Status: DC

## 2012-01-27 MED ORDER — POTASSIUM CHLORIDE CRYS ER 20 MEQ PO TBCR
40.0000 meq | EXTENDED_RELEASE_TABLET | Freq: Once | ORAL | Status: DC
  Filled 2012-01-27 (×2): qty 2

## 2012-01-27 MED ORDER — METFORMIN HCL 500 MG PO TABS
500.0000 mg | ORAL_TABLET | Freq: Two times a day (BID) | ORAL | Status: DC
  Administered 2012-01-27 – 2012-01-28 (×4): 500 mg via ORAL
  Filled 2012-01-27 (×6): qty 1

## 2012-01-27 MED ORDER — POTASSIUM CHLORIDE 20 MEQ/15ML (10%) PO LIQD
ORAL | Status: AC
  Administered 2012-01-27: 80 meq
  Filled 2012-01-27: qty 60

## 2012-01-27 NOTE — ED Notes (Signed)
Patient presents with c/o chest pain since about 9pm.  States it is over her heart but denies N/V/SOB, diaphoresis.  States the pain is dull and achy and radiates into the neck.   Lungs clear bilaterally.

## 2012-01-27 NOTE — ED Provider Notes (Signed)
History     CSN: [id number]  Arrival date & time 01/27/12  0140   First MD Initiated Contact with Patient 01/27/12 0204      No chief complaint on file.   (Consider location/radiation/quality/duration/timing/severity/associated sxs/prior treatment) HPI Chest pain started tonight around 9 PM. Located left chest. Has a history of MI and is followed by cardiology in [place] where she lives. She is also seen the [facility] cardiology here in the past with history of cardiac catheterization. Quality of pain is pressure-like and is somewhat affected by taking a deep breath. Pain radiates to her left neck. She states it is not similar to her previous MI. No trauma. No shortness of breath unless she takes a deep breath. No nausea or vomiting. No diaphoresis. She denies any leg pain or swelling. No recent cough cold or congestion. Pain is moderate to severe. No known aggravating or alleviating factors otherwise. Patient took 4 baby aspirins prior to arrival. Nitroglycerin in route did improve pain some   No past medical history on file.  No past surgical history on file.  No family history on file.  History  Substance Use Topics  . Smoking status: Not on file  . Smokeless tobacco: Not on file  . Alcohol Use: Not on file    OB History    No data available      Review of Systems  Constitutional: Negative for fever and chills.  HENT: Negative for neck pain and neck stiffness.   Eyes: Negative for pain.  Respiratory: Negative for shortness of breath.   Cardiovascular: Positive for chest pain.  Gastrointestinal: Negative for abdominal pain.  Genitourinary: Negative for dysuria.  Musculoskeletal: Negative for back pain.  Skin: Negative for rash.  Neurological: Negative for headaches.  All other systems reviewed and are negative.    Allergies  Lotrel and Sulfa antibiotics  Home Medications   Current Outpatient Rx  Name Route Sig Dispense Refill  . AMLODIPINE BESYLATE 10 MG PO  TABS Oral Take 10 mg by mouth daily.    Marland Kitchen HYDROCHLOROTHIAZIDE 25 MG PO TABS Oral Take 25 mg by mouth daily.    Marland Kitchen METFORMIN HCL 500 MG PO TABS Oral Take 500 mg by mouth 2 (two) times daily with a meal.      BP 133/76  Pulse 80  Temp(Src) 97.6 F (36.4 C) (Oral)  Resp 16  SpO2 100%  Physical Exam  Constitutional: She is oriented to person, place, and time. She appears well-developed and well-nourished.  HENT:  Head: Normocephalic and atraumatic.  Eyes: Conjunctivae and EOM are normal. Pupils are equal, round, and reactive to light.  Neck: Trachea normal. Neck supple. No thyromegaly present.  Cardiovascular: Normal rate, regular rhythm, S1 normal, S2 normal and normal pulses.     No systolic murmur is present   No diastolic murmur is present  Pulses:      Radial pulses are 2+ on the right side, and 2+ on the left side.  Pulmonary/Chest: Effort normal and breath sounds normal. She has no wheezes. She has no rhonchi. She has no rales.       Somewhat reproducible left anterior chest wall tenderness without crepitus  Abdominal: Soft. Normal appearance and bowel sounds are normal. There is no tenderness. There is no CVA tenderness and negative Murphy's sign.  Musculoskeletal:       BLE:s Calves nontender, no cords or erythema, negative Homans sign  Neurological: She is alert and oriented to person, place, and time. She has normal  strength. No cranial nerve deficit or sensory deficit. GCS eye subscore is 4. GCS verbal subscore is 5. GCS motor subscore is 6.  Skin: Skin is warm and dry. No rash noted. She is not diaphoretic.  Psychiatric: Her speech is normal.       Cooperative and appropriate    ED Course  Procedures (including critical care time)   Results for orders placed during the hospital encounter of 01/27/12  CBC      Component Value Range   WBC 10.0  4.0 - 10.5 (K/uL)   RBC 4.38  3.87 - 5.11 (MIL/uL)   Hemoglobin 12.6  12.0 - 15.0 (g/dL)   HCT 16.1  09.6 - 04.5 (%)   MCV  84.5  78.0 - 100.0 (fL)   MCH 28.8  26.0 - 34.0 (pg)   MCHC 34.1  30.0 - 36.0 (g/dL)   RDW 40.9  81.1 - 91.4 (%)   Platelets 350  150 - 400 (K/uL)  COMPREHENSIVE METABOLIC PANEL      Component Value Range   Sodium 140  135 - 145 (mEq/L)   Potassium 2.9 (*) 3.5 - 5.1 (mEq/L)   Chloride 102  96 - 112 (mEq/L)   CO2 27  19 - 32 (mEq/L)   Glucose, Bld 111 (*) 70 - 99 (mg/dL)   BUN 17  6 - 23 (mg/dL)   Creatinine, Ser 7.82  0.50 - 1.10 (mg/dL)   Calcium 95.6  8.4 - 10.5 (mg/dL)   Total Protein 7.7  6.0 - 8.3 (g/dL)   Albumin 3.8  3.5 - 5.2 (g/dL)   AST 19  0 - 37 (U/L)   ALT 15  0 - 35 (U/L)   Alkaline Phosphatase 126 (*) 39 - 117 (U/L)   Total Bilirubin 0.2 (*) 0.3 - 1.2 (mg/dL)   GFR calc non Af Amer 74 (*) >90 (mL/min)   GFR calc Af Amer 86 (*) >90 (mL/min)  D-DIMER, QUANTITATIVE      Component Value Range   D-Dimer, Quant 0.40  0.00 - 0.48 (ug/mL-FEU)  POCT I-STAT TROPONIN I      Component Value Range   Troponin i, poc 0.00  0.00 - 0.08 (ng/mL)   Comment 3            Dg Chest Portable 1 View  01/27/2012  *RADIOLOGY REPORT*  Clinical Data: Chest pain and shortness of breath  PORTABLE CHEST - 1 VIEW  Comparison: 02/06/2010  Findings: Shallow inspiration with elevation of left hemidiaphragm. Normal heart size and pulmonary vascularity.  No focal airspace consolidation in the lungs.  No blunting of costophrenic angles. No pneumothorax.  Old healed left upper rib fractures.  Stable appearance since previous study.  IMPRESSION: No evidence of active pulmonary disease.  Original Report Authenticated By: Marlon Pel, M.D.    3:45 AM PT requesting [facility] cardiology, old records reviewed and case discussed with DR Hochrein.  He agrees to evaluate PT in the ER.    Date: 01/27/2012  Rate: 84  Rhythm: normal sinus rhythm  QRS Axis: normal  Intervals: normal  ST/T Wave abnormalities: nonspecific ST changes  Conduction Disutrbances:none  Narrative Interpretation:   Old EKG  Reviewed: none available  Aspirin prior to arrival. Nitroglycerin decreases pain. IV morphine provided.  MDM   Chest pain with significant cardiac history. Pain is pleuritic and somewhat reproducible. EKG does not show any acute ischemia. Chest x-ray labs obtained and reviewed as above cardiology consult at for evaluation in the ED.  Sunnie Nielsen, MD 01/27/12 0430

## 2012-01-27 NOTE — H&P (Signed)
CARDIOLOGY ADMISSION NOTE  Patient ID: Julie Acevedo MRN: [id number] DOB/AGE: 07/29/1951 61 y.o.  Admit date: 01/27/2012 Primary Physician  Nilda Simmer   Primary Cardiologist   None Chief Complaint    Chest Pain  HPI:  The patient presents with chest discomfort. This started at 9 PM tonight. It was at rest. She describes a sharp stabbing discomfort. She points to her left upper chest radiating to her left shoulder. It is severe. Made worse with movement palpation or deep breathing. She called EMS and was given an aspirin. He became nauseated after this. She might of had very slight improvement with sublingual nitroglycerin. However, she's still having the discomfort. She's not describing diaphoresis or shortness of breath. She occasionally does get palpitations. She's not had any presyncope. He's had no PND or orthopnea. She's otherwise not had any recent symptoms.  Of note her past cardiac history includes a distant catheterization which I have the results. She did have a stress perfusion study in 2010 which was negative for any evidence of ischemia or infarct.  Past Medical History  Diagnosis Date  . DM (diabetes mellitus)   . HTN (hypertension)   . Diverticulitis     Past Surgical History  Procedure Date  . Hemicolectomy   . Lumbar disc surgery   . Roux-en-y gastric bypass   . Abdominal hysterectomy   . Cholecystectomy     Allergies  Allergen Reactions  . Lotrel (Amlodipine Besy-Benazepril Hcl) Anaphylaxis  . Sulfa Antibiotics Hives   Current Outpatient Prescriptions: NORVASC 10 MG daily HYDRODIURIL 25 MG daily GLUCOPHAGE 500 MG two times daily   History   Social History  . Marital Status: Married    Spouse Name: N/A    Number of Children: N/A  . Years of Education: N/A   Occupational History  . Not on file.   Social History Main Topics  . Smoking status: Not on file  . Smokeless tobacco: Not on file  . Alcohol Use: Not on file  . Drug Use: Not on file  .  Sexually Active: Not on file   Other Topics Concern  . Not on file   Social History Narrative  . No narrative on file    No family history on file.   ROS:  As stated in the HPI and negative for all other systems.  Physical Exam: Blood pressure 133/76, pulse 80, temperature 97.6 F (36.4 C), temperature source Oral, resp. rate 16, SpO2 100.00%.  GENERAL:  Well appearing HEENT:  Pupils equal round and reactive, fundi not visualized, oral mucosa unremarkable NECK:  No jugular venous distention, waveform within normal limits, carotid upstroke brisk and symmetric, no bruits, no thyromegaly LYMPHATICS:  No cervical, inguinal adenopathy LUNGS:  Clear to auscultation bilaterally BACK:  No CVA tenderness CHEST:  Unremarkable HEART:  PMI not displaced or sustained,S1 and S2 within normal limits, no S3, no S4, no clicks, no rubs, no murmurs ABD:  Flat, positive bowel sounds normal in frequency in pitch, no bruits, no rebound, no guarding, no midline pulsatile mass, no hepatomegaly, no splenomegaly EXT:  2 plus pulses throughout, no edema, no cyanosis no clubbing SKIN:  No rashes no nodules NEURO:  Cranial nerves II through XII grossly intact, motor grossly intact throughout PSYCH:  Cognitively intact, oriented to person place and time  Labs: Lab Results  Component Value Date   BUN 17 01/27/2012   Lab Results  Component Value Date   CREATININE 0.85 01/27/2012   Lab Results  Component Value  Date   NA 140 01/27/2012   K 2.9* 01/27/2012   CL 102 01/27/2012   CO2 27 01/27/2012   Lab Results  Component Value Date   CKTOTAL 187* 08/29/2009   CKMB 2.4 08/28/2009   TROPONINI  Value: 0.01        NO INDICATION OF MYOCARDIAL INJURY. 08/29/2009   Lab Results  Component Value Date   WBC 10.0 01/27/2012   HGB 12.6 01/27/2012   HCT 37.0 01/27/2012   MCV 84.5 01/27/2012   PLT 350 01/27/2012    Lab Results  Component Value Date   ALT 15 01/27/2012   AST 19 01/27/2012   ALKPHOS 126* 01/27/2012    BILITOT 0.2* 01/27/2012      Radiology:   CXR:  No active pulmonary disease.  EKG:  Sinus rhythm, rate 75, axis within normal limits, intervals within normal limits, no acute ST-T wave changes.   ASSESSMENT AND PLAN:    1)  Chest pain:  Atypical and reproducible with palpation. Likely musculoskeletal. She will be observed overnight and enzymes cycled. She'll be treated with morphine pain. If her enzymes are negative and followup EKG unremarkable no further inpatient testing would be needed.   2)  DM:  Continue previous medications   3)  Risk reduction:  Check fasting lipid profile.  Consider statin at discharge given DM   4) HTN:  BP currently controlled.  Continue meds as listed.  SignedRollene Rotunda 01/27/2012, 3:57 AM

## 2012-01-27 NOTE — ED Notes (Signed)
Dr. Antoine Poche called regarding bed placement.  Stated that this is reproduceable discomfort therefore, not cardiac and he wants her on a telemetry floor.

## 2012-01-27 NOTE — ED Notes (Signed)
Dr. Antoine Poche in to see patient.  Orders changed.

## 2012-01-28 ENCOUNTER — Other Ambulatory Visit: Payer: Self-pay

## 2012-01-28 DIAGNOSIS — R079 Chest pain, unspecified: Secondary | ICD-10-CM

## 2012-01-28 LAB — LIPID PANEL
Cholesterol: 143 mg/dL (ref 0–200)
HDL: 56 mg/dL (ref >39)
LDL Cholesterol: 66 mg/dL (ref 0–99)
Total CHOL/HDL Ratio: 2.6 RATIO
Triglycerides: 104 mg/dL (ref <150)
VLDL: 21 mg/dL (ref 0–40)

## 2012-01-28 LAB — BASIC METABOLIC PANEL
BUN: 13 mg/dL (ref 6–23)
CO2: 27 mEq/L (ref 19–32)
Calcium: 9.5 mg/dL (ref 8.4–10.5)
Chloride: 105 mEq/L (ref 96–112)
Creatinine, Ser: 0.92 mg/dL (ref 0.50–1.10)
GFR calc Af Amer: 78 mL/min — ABNORMAL LOW (ref >90)
GFR calc non Af Amer: 67 mL/min — ABNORMAL LOW (ref >90)
Glucose, Bld: 147 mg/dL — ABNORMAL HIGH (ref 70–99)
Potassium: 3.6 mEq/L (ref 3.5–5.1)
Sodium: 139 mEq/L (ref 135–145)

## 2012-01-28 LAB — GLUCOSE, CAPILLARY
Glucose-Capillary: 93 mg/dL (ref 70–99)
Glucose-Capillary: 79 mg/dL (ref 70–99)

## 2012-01-28 NOTE — Progress Notes (Signed)
UR Completed. Simmons, Brooke Payes F [phone]  

## 2012-01-28 NOTE — Discharge Summary (Addendum)
Discharge Summary   Patient ID: Julie Acevedo MRN: [id number], DOB/AGE: 1951-12-17 61 y.o.  Primary MD: Nilda Simmer in [place] Primary Cardiologist: none  Admit date: 01/27/2012 D/C date:     01/28/2012      Primary Discharge Diagnoses:  1. Chest pain, Atypical   - negative cardiac enzymes, EKG w/o acute ST/T changes   - Normal Lexiscan Myoview w/ EF 61% 08/2009  - Reportedly no significant coronary disease by Cardiac cath ~2000  - for outpatient follow-up with PCP  2. HTN  - SBPs 90s-130s and K+ 2.9 on admission; K+ supplemented  - DC'd HCTZ, Cont CCB  Secondary Discharge Diagnoses:  1. DM, Type 2 - A1C 6.9, On Metformin 2. 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 Diverticulitis 4. S/p Hemicolectomy 5. S/p lumbar disc surgery 6. S/p Abdominal Hysterectomy 7. S/p Cholecystectomy  Allergies Allergen Reactions  . Lotrel (Amlodipine Besy-Benazepril Hcl) Anaphylaxis  . Sulfa Antibiotics Hives    Diagnostic Studies/Procedures:  None  History of Present Illness: 61 y.o. female w/ PMHx significant for DM, HTN, normal Lexiscan Myoview 08/2009, and reportedly no significant CAD by cardiac cath in ~2000 [facility] with complaints of chest pain.  Her pain started the night of presentation while at rest and was described as a severe, sharp, stabbing discomfort in her left upper chest radiating to her left shoulder. It was worsened with movement palpation or deep breathing. She might of had very slight improvement with sublingual nitroglycerin.   Hospital Course: In the ED her EKG revealed NSR 75bpm, with no significant ST/T changes. CXR was without acute cardiopulmonary abnormalities. Initial cardiac enzyme was negative. DDimer was negative. K+ was low at 2.9, for which she received supplementation with subsequent K+ level 3.6. It was felt her chest pain was atypical in nature so she was admitted for overnight observation  to rule out MI rule.  Cardiac enzymes were cycled and remained negative. EKG was without acute changes. SBPs were 90s-130s and with hypokalemia on admission Dr. Graciela Husbands opted to stop her HCTZ and have her follow up with her PCP for further BP med titration.  She was seen and evaluated by Dr. Graciela Husbands who felt she was stable for discharge home with plans for follow up as scheduled below. She will need to follow up with her PCP regarding necessity of initiation of a statin given her diabetes mellitus.  Discharge Vitals: Blood pressure 118/76, pulse 70, temperature 98.6 F (37 C), temperature source Oral, resp. rate 18, height 5\' 3"  (1.6 m), weight 166 lb 7.2 oz (75.5 kg), SpO2 97.00%.  Physical Exam:  General: Well developed, well nourished, black female in no acute distress. Head: Normocephalic, atraumatic, sclera non-icteric, nares are without discharge.  Neck: Supple. Negative for carotid bruits or JVD Lungs: Clear bilaterally to auscultation without wheezes, rales, or rhonchi. Breathing is unlabored. Heart: RRR S1 S2 without murmurs, rubs, or gallops.  Abdomen: Soft, non-tender, non-distended with normoactive bowel sounds. No rebound/guarding. No obvious abdominal masses. Msk:  Strength and tone appear normal for age. Extremities: Trace BLE edema. No clubbing or cyanosis. Distal pedal pulses are 2+ and equal bilaterally. Neuro: Alert and oriented X 3. Moves all extremities spontaneously. Psych:  Responds to questions appropriately with a normal affect.  Labs: Component Value Date   WBC 9.3 01/27/2012   HGB 11.7* 01/27/2012   HCT 35.4* 01/27/2012   MCV 86.8 01/27/2012   PLT 291 01/27/2012    Lab 01/27/12 2306  01/27/12 0232  NA 139 --  K 3.6 --  CL 105 --  CO2 27 --  BUN 13 --  CREATININE 0.92 --  CALCIUM 9.5 --  PROT -- 7.7  BILITOT -- 0.2*  ALKPHOS -- 126*  ALT -- 15  AST -- 19  GLUCOSE 147* --   Basename 01/27/12 1908 01/27/12 1245 01/27/12 0758  CKTOTAL 118 121 120  CKMB 1.9  2.1 2.1  TROPONINI <0.30 <0.30 <0.30   Component Value Date   CHOL 143 01/27/2012   HDL 56 01/27/2012   LDLCALC 66 01/27/2012   TRIG 104 01/27/2012   Component Value Date   DDIMER 0.40 01/27/2012     01/27/2012 09:50  Hemoglobin A1C 6.9 (H)     01/27/2012 09:50  TSH 3.779     Discharge Medications   Medication List  As of 01/28/2012  2:56 PM   STOP taking these medications         hydrochlorothiazide 25 MG tablet         TAKE these medications         amLODipine 10 MG tablet   Commonly known as: NORVASC   Take 10 mg by mouth daily.      metFORMIN 500 MG tablet   Commonly known as: GLUCOPHAGE   Take 500 mg by mouth 2 (two) times daily with a meal.            Disposition   Discharge Orders    Future Orders Please Complete By Expires   Diet - low sodium heart healthy      Increase activity slowly      Discharge instructions      Comments:   Please follow up with your primary care provider regarding your blood pressure as we stopped your HCTZ medication and regarding necessity of adding a cholesterol medication for primary prevention given your diabetes mellitus.  *PLEASE REMEMBER TO BRING ALL OF YOUR MEDICATIONS TO EACH OF YOUR FOLLOW-UP OFFICE VISITS.      Follow-up Information    Follow up with SMITH,KRISTI, MD. Schedule an appointment as soon as possible for a visit in 2 weeks.   Contact information:   [facility] [address] [place] [address] [phone]           Outstanding Labs/Studies: None  Duration of Discharge Encounter: Greater than 30 minutes including physician and PA time.  Signed, HOPE, JESSICA PA-C 01/28/2012, 2:56 PM  Ok for discharge  followup with PCP as scheduled on thurs  No cardiology followup necesseary

## 2012-01-31 LAB — MICROALBUMIN, URINE: Microalb, Ur: 50

## 2012-02-11 IMAGING — CR DG CHEST 2V
2 series · 2 of 2 positions shown · non-contrast
Comparison: 08/28/2009

CLINICAL DATA: Bariatric screening.

CHEST - 2 VIEW

[view not recorded (1 of 2)]
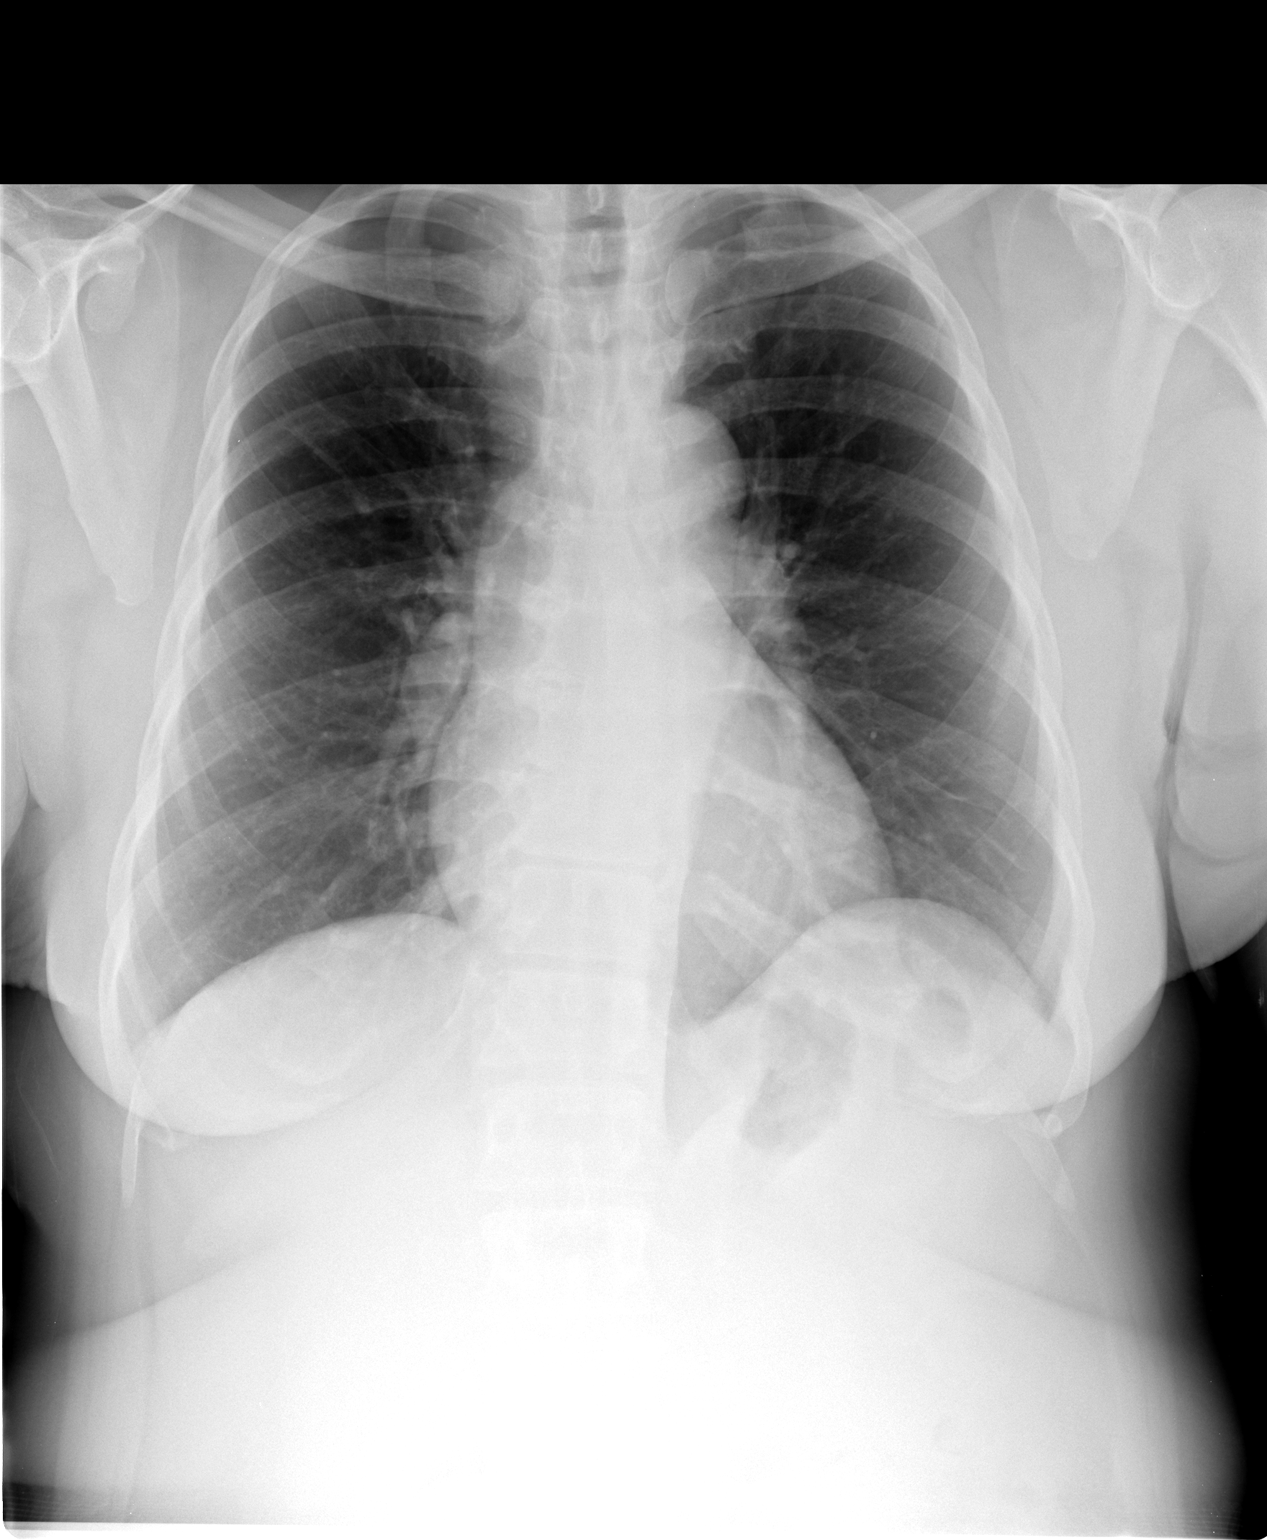

[view not recorded (2 of 2)]
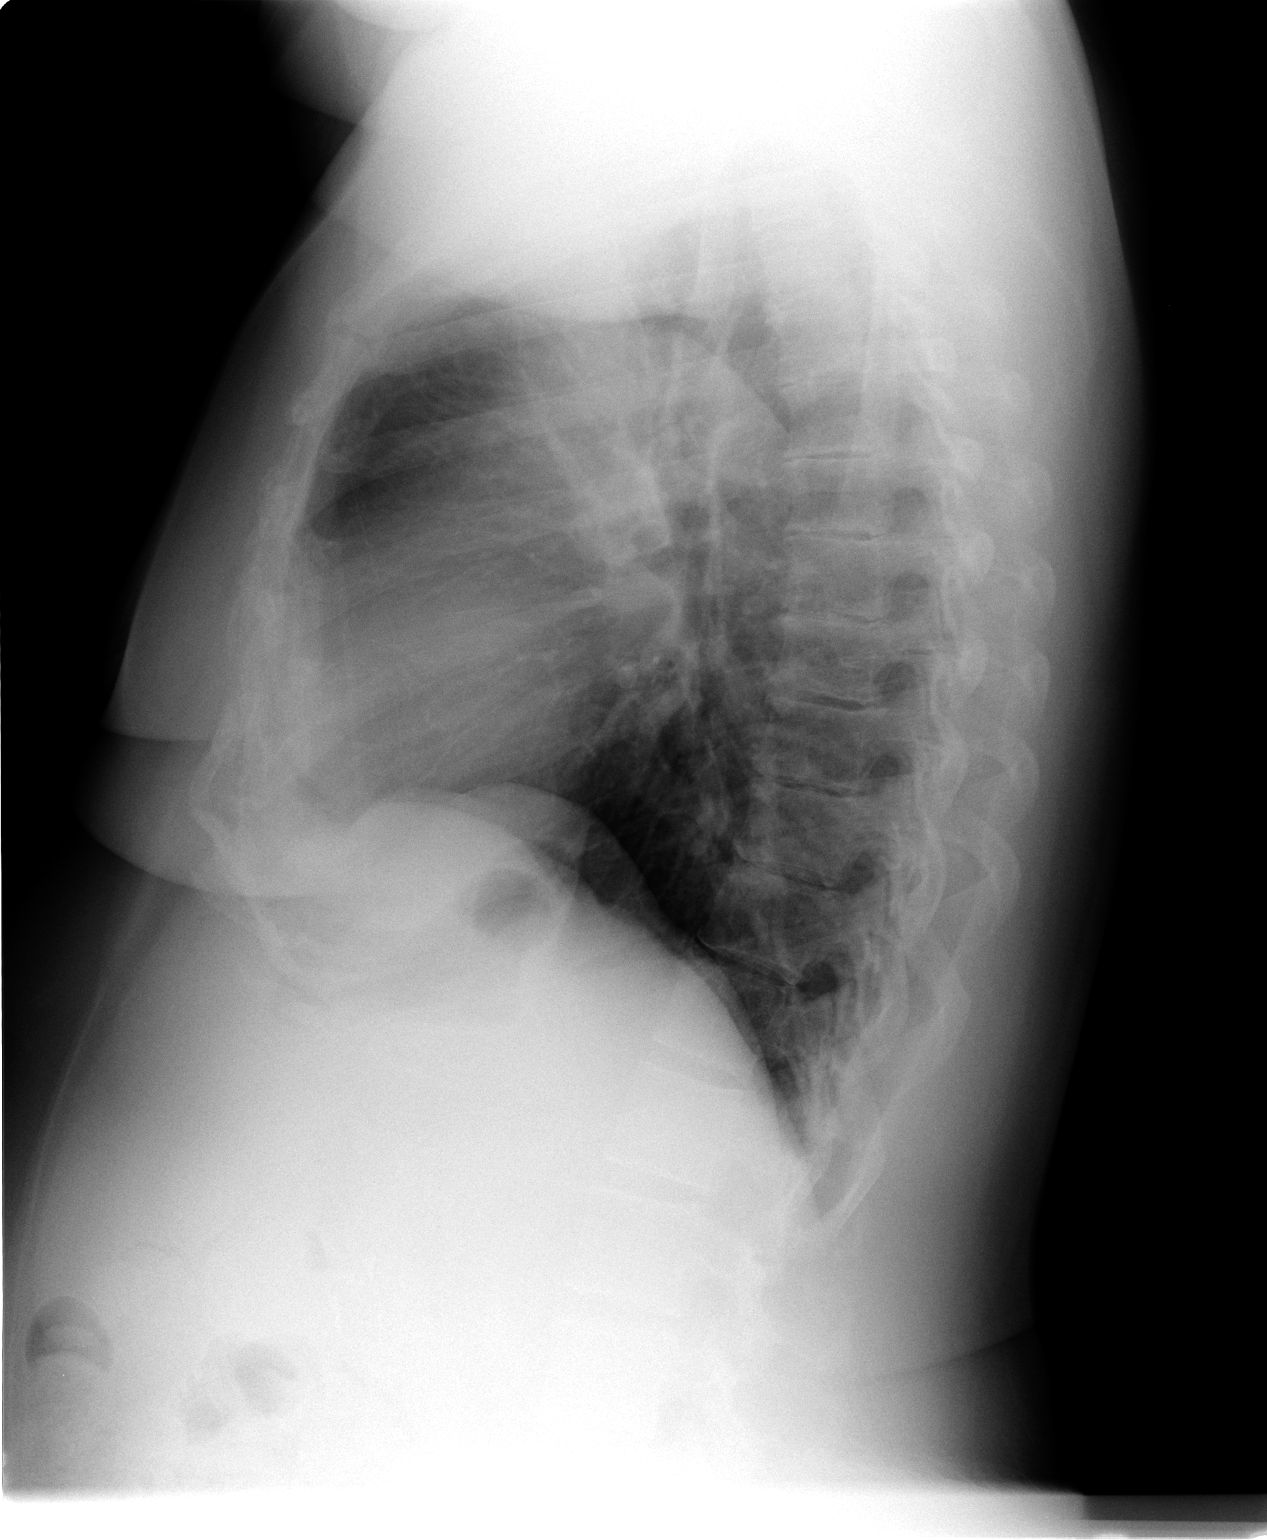

[2 of 2 positions shown; findings below may reference images not displayed]

FINDINGS: Borderline cardiomegaly.  Clear lungs.  No pneumothorax.
No pleural effusion.
IMPRESSION: No active cardiopulmonary disease.

## 2012-02-19 ENCOUNTER — Telehealth (INDEPENDENT_AMBULATORY_CARE_PROVIDER_SITE_OTHER): Payer: Self-pay | Admitting: General Surgery

## 2012-02-19 NOTE — Telephone Encounter (Signed)
02/19/12 mailed recall letter for bariatric surgery follow-up. Advised the patient to call [facility] @ [phone] to schedule an appointment...cef °

## 2012-05-01 LAB — HM PAP SMEAR: HM Pap smear: NORMAL

## 2012-05-05 LAB — CBC AND DIFFERENTIAL
Neutrophils Absolute: 4 /uL
WBC: 6.7 10^3/mL

## 2012-05-25 ENCOUNTER — Emergency Department: Payer: Self-pay | Admitting: Emergency Medicine

## 2012-06-12 ENCOUNTER — Ambulatory Visit: Payer: Self-pay | Admitting: Family Medicine

## 2012-09-02 IMAGING — CR DG UGI W/ GASTROGRAFIN
2 series · 2 of 2 positions shown · IV contrast (agent unspecified)
Comparison: None.

CLINICAL DATA: Status post gastric bypass

WATER SOLUBLE UPPER GI SERIES
TECHNIQUE: Single-column upper GI series was performed using water
soluble contrast.
Fluoroscopy Time: 0.7 minutes
Contrast: 50 ml of water soluble contrast.

[view not recorded (1 of 2)]
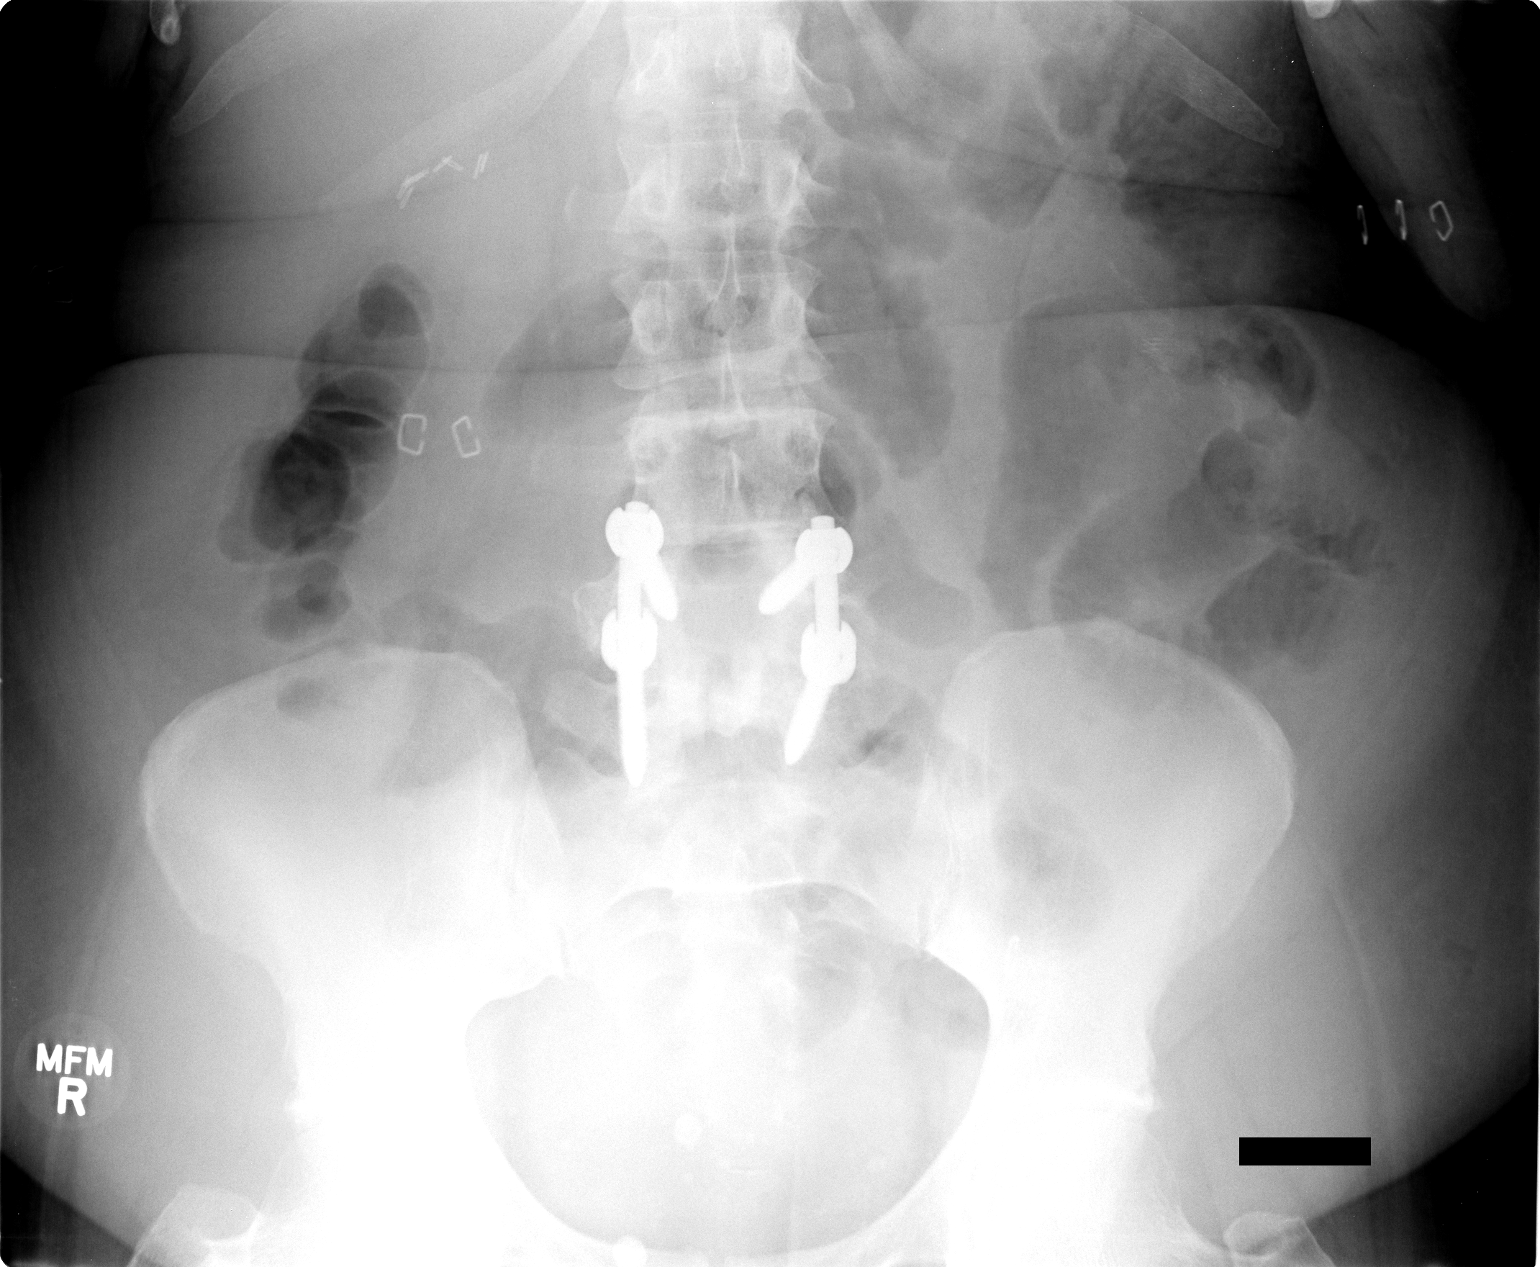

[view not recorded (2 of 2)]
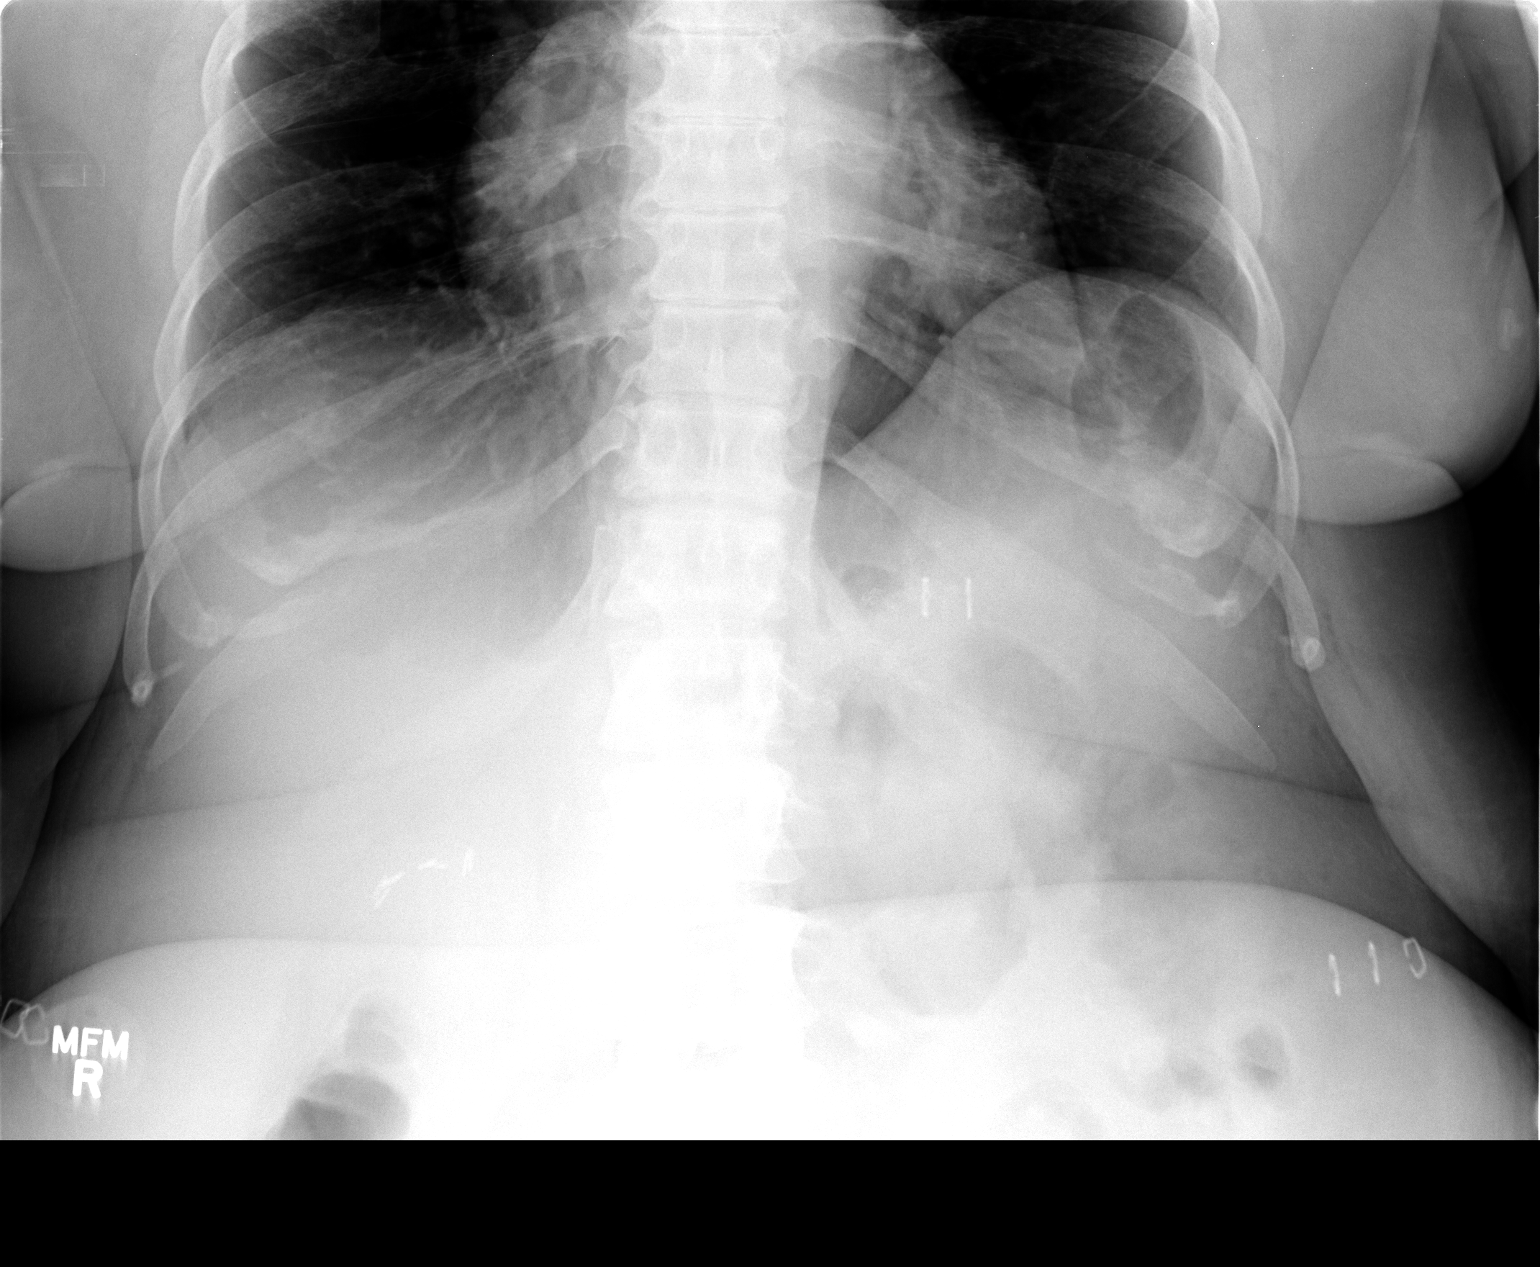

[2 of 2 positions shown; findings below may reference images not displayed]

FINDINGS: Scout images demonstrate scattered clips seen throughout
the abdomen.  There is no intra-abdominal free air.  Gas-filled
loops of small bowel are seen in the left abdomen which are at the
upper limits of normal in size.  Postsurgical changes of lumbar
fusion are seen at L4-L5.

The gastroesophageal junction is patent.  A small gastric remnant
is identified with rapid draining into afferent loop.  The contrast
travels past the expected location of the jejunojejunal
anastamosis.  No leak is seen.
IMPRESSION: Normal examination status post gastric bypass.

## 2012-11-15 ENCOUNTER — Inpatient Hospital Stay (HOSPITAL_COMMUNITY)
Admission: EM | Admit: 2012-11-15 | Discharge: 2012-11-18 | DRG: 100 | Disposition: A | Discharge status: Home / Self Care | Payer: Medicare PPO | Attending: Emergency Medicine | Admitting: Emergency Medicine

## 2012-11-15 ENCOUNTER — Emergency Department (HOSPITAL_COMMUNITY): Payer: Medicare PPO

## 2012-11-15 ENCOUNTER — Inpatient Hospital Stay (HOSPITAL_COMMUNITY): Payer: Medicare PPO

## 2012-11-15 ENCOUNTER — Encounter (HOSPITAL_COMMUNITY): Payer: Self-pay | Admitting: Physical Medicine and Rehabilitation

## 2012-11-15 DIAGNOSIS — I10 Essential (primary) hypertension: Secondary | ICD-10-CM

## 2012-11-15 DIAGNOSIS — R4789 Other speech disturbances: Secondary | ICD-10-CM

## 2012-11-15 DIAGNOSIS — I2510 Atherosclerotic heart disease of native coronary artery without angina pectoris: Secondary | ICD-10-CM | POA: Diagnosis present

## 2012-11-15 DIAGNOSIS — R1310 Dysphagia, unspecified: Secondary | ICD-10-CM | POA: Diagnosis present

## 2012-11-15 DIAGNOSIS — G40201 Localization-related (focal) (partial) symptomatic epilepsy and epileptic syndromes with complex partial seizures, not intractable, with status epilepticus: Secondary | ICD-10-CM

## 2012-11-15 DIAGNOSIS — G40109 Localization-related (focal) (partial) symptomatic epilepsy and epileptic syndromes with simple partial seizures, not intractable, without status epilepticus: Principal | ICD-10-CM | POA: Diagnosis present

## 2012-11-15 DIAGNOSIS — R4182 Altered mental status, unspecified: Secondary | ICD-10-CM

## 2012-11-15 DIAGNOSIS — I498 Other specified cardiac arrhythmias: Secondary | ICD-10-CM | POA: Diagnosis present

## 2012-11-15 DIAGNOSIS — Z23 Encounter for immunization: Secondary | ICD-10-CM

## 2012-11-15 DIAGNOSIS — Z888 Allergy status to other drugs, medicaments and biological substances status: Secondary | ICD-10-CM

## 2012-11-15 DIAGNOSIS — I252 Old myocardial infarction: Secondary | ICD-10-CM

## 2012-11-15 DIAGNOSIS — R0789 Other chest pain: Secondary | ICD-10-CM

## 2012-11-15 DIAGNOSIS — Z8719 Personal history of other diseases of the digestive system: Secondary | ICD-10-CM

## 2012-11-15 DIAGNOSIS — R4702 Dysphasia: Secondary | ICD-10-CM

## 2012-11-15 DIAGNOSIS — G9340 Encephalopathy, unspecified: Secondary | ICD-10-CM | POA: Diagnosis present

## 2012-11-15 DIAGNOSIS — E876 Hypokalemia: Secondary | ICD-10-CM | POA: Diagnosis present

## 2012-11-15 DIAGNOSIS — Z8673 Personal history of transient ischemic attack (TIA), and cerebral infarction without residual deficits: Secondary | ICD-10-CM

## 2012-11-15 DIAGNOSIS — Z79899 Other long term (current) drug therapy: Secondary | ICD-10-CM

## 2012-11-15 DIAGNOSIS — Z9884 Bariatric surgery status: Secondary | ICD-10-CM

## 2012-11-15 DIAGNOSIS — E119 Type 2 diabetes mellitus without complications: Secondary | ICD-10-CM

## 2012-11-15 HISTORY — DX: Unspecified osteoarthritis, unspecified site: M19.90

## 2012-11-15 HISTORY — DX: Other amnesia: R41.3

## 2012-11-15 LAB — CBC WITH DIFFERENTIAL/PLATELET
Basophils Absolute: 0 10*3/uL (ref 0.0–0.1)
Basophils Relative: 0 % (ref 0–1)
Eosinophils Absolute: 0.1 10*3/uL (ref 0.0–0.7)
Eosinophils Relative: 0 % (ref 0–5)
HCT: 37.1 % (ref 36.0–46.0)
Hemoglobin: 11.8 g/dL — ABNORMAL LOW (ref 12.0–15.0)
Lymphocytes Relative: 16 % (ref 12–46)
Lymphs Abs: 1.9 10*3/uL (ref 0.7–4.0)
MCH: 26.7 pg (ref 26.0–34.0)
MCHC: 31.8 g/dL (ref 30.0–36.0)
MCV: 83.9 fL (ref 78.0–100.0)
Monocytes Absolute: 0.7 10*3/uL (ref 0.1–1.0)
Monocytes Relative: 6 % (ref 3–12)
Neutro Abs: 9.3 10*3/uL — ABNORMAL HIGH (ref 1.7–7.7)
Neutrophils Relative %: 78 % — ABNORMAL HIGH (ref 43–77)
Platelets: 277 10*3/uL (ref 150–400)
RBC: 4.42 MIL/uL (ref 3.87–5.11)
RDW: 13.4 % (ref 11.5–15.5)
WBC: 11.9 10*3/uL — ABNORMAL HIGH (ref 4.0–10.5)

## 2012-11-15 LAB — URINALYSIS, ROUTINE W REFLEX MICROSCOPIC
Bilirubin Urine: NEGATIVE
Glucose, UA: 250 mg/dL — AB
Ketones, ur: NEGATIVE mg/dL
Leukocytes, UA: NEGATIVE
Nitrite: NEGATIVE
Protein, ur: NEGATIVE mg/dL
Specific Gravity, Urine: 1.011 (ref 1.005–1.030)
Urobilinogen, UA: 0.2 mg/dL (ref 0.0–1.0)
pH: 6.5 (ref 5.0–8.0)

## 2012-11-15 LAB — POCT I-STAT, CHEM 8
BUN: 10 mg/dL (ref 6–23)
Calcium, Ion: 1.18 mmol/L (ref 1.12–1.23)
Chloride: 105 mEq/L (ref 96–112)
Creatinine, Ser: 0.9 mg/dL (ref 0.50–1.10)
Glucose, Bld: 158 mg/dL — ABNORMAL HIGH (ref 70–99)
HCT: 39 % (ref 36.0–46.0)
Hemoglobin: 13.3 g/dL (ref 12.0–15.0)
Potassium: 3.2 mEq/L — ABNORMAL LOW (ref 3.5–5.1)
Sodium: 143 mEq/L (ref 135–145)
TCO2: 24 mmol/L (ref 0–100)

## 2012-11-15 LAB — BLOOD GAS, ARTERIAL
Acid-Base Excess: 2.7 mmol/L — ABNORMAL HIGH (ref 0.0–2.0)
Bicarbonate: 27.2 mEq/L — ABNORMAL HIGH (ref 20.0–24.0)
Drawn by: 252031
O2 Content: 2 L/min
Patient temperature: 98.6
TCO2: 28.6 mmol/L (ref 0–100)
pCO2 arterial: 45.1 mmHg — ABNORMAL HIGH (ref 35.0–45.0)
pH, Arterial: 7.397 (ref 7.350–7.450)
pO2, Arterial: 136 mmHg — ABNORMAL HIGH (ref 80.0–100.0)

## 2012-11-15 LAB — COMPREHENSIVE METABOLIC PANEL
ALT: 13 U/L (ref 0–35)
AST: 17 U/L (ref 0–37)
Albumin: 3.5 g/dL (ref 3.5–5.2)
Alkaline Phosphatase: 128 U/L — ABNORMAL HIGH (ref 39–117)
BUN: 11 mg/dL (ref 6–23)
CO2: 26 mEq/L (ref 19–32)
Calcium: 9.7 mg/dL (ref 8.4–10.5)
Chloride: 104 mEq/L (ref 96–112)
Creatinine, Ser: 0.78 mg/dL (ref 0.50–1.10)
GFR calc Af Amer: >90 mL/min (ref >90)
GFR calc non Af Amer: 90 mL/min — ABNORMAL LOW (ref >90)
Glucose, Bld: 163 mg/dL — ABNORMAL HIGH (ref 70–99)
Potassium: 3.2 mEq/L — ABNORMAL LOW (ref 3.5–5.1)
Sodium: 140 mEq/L (ref 135–145)
Total Bilirubin: 0.2 mg/dL — ABNORMAL LOW (ref 0.3–1.2)
Total Protein: 7.5 g/dL (ref 6.0–8.3)

## 2012-11-15 LAB — URINE MICROSCOPIC-ADD ON

## 2012-11-15 LAB — POCT I-STAT TROPONIN I: Troponin i, poc: 0 ng/mL (ref 0.00–0.08)

## 2012-11-15 LAB — RAPID URINE DRUG SCREEN, HOSP PERFORMED
Amphetamines: NOT DETECTED
Barbiturates: NOT DETECTED
Benzodiazepines: POSITIVE — AB
Cocaine: NOT DETECTED
Opiates: NOT DETECTED
Tetrahydrocannabinol: NOT DETECTED

## 2012-11-15 LAB — MAGNESIUM: Magnesium: 1.9 mg/dL (ref 1.5–2.5)

## 2012-11-15 LAB — GLUCOSE, CAPILLARY: Glucose-Capillary: 156 mg/dL — ABNORMAL HIGH (ref 70–99)

## 2012-11-15 LAB — ETHANOL: Alcohol, Ethyl (B): <11 mg/dL (ref 0–11)

## 2012-11-15 MED ORDER — SODIUM CHLORIDE 0.9 % IV SOLN
20.0000 mg/kg | Freq: Once | INTRAVENOUS | Status: AC
  Administered 2012-11-15: 1510 mg via INTRAVENOUS
  Filled 2012-11-15: qty 30.2

## 2012-11-15 MED ORDER — ASPIRIN EC 325 MG PO TBEC
325.0000 mg | DELAYED_RELEASE_TABLET | Freq: Every day | ORAL | Status: DC
  Administered 2012-11-16 – 2012-11-18 (×3): 325 mg via ORAL
  Filled 2012-11-15 (×3): qty 1

## 2012-11-15 MED ORDER — ONDANSETRON HCL 4 MG/2ML IJ SOLN: 4.0000 mg | Freq: Four times a day (QID) | INTRAMUSCULAR | Status: DC | PRN

## 2012-11-15 MED ORDER — LORAZEPAM 2 MG/ML IJ SOLN
1.0000 mg | Freq: Once | INTRAMUSCULAR | Status: AC
  Administered 2012-11-15: 1 mg via INTRAVENOUS

## 2012-11-15 MED ORDER — SODIUM CHLORIDE 0.9 % IV SOLN
500.0000 mg | Freq: Two times a day (BID) | INTRAVENOUS | Status: DC
  Administered 2012-11-15: 500 mg via INTRAVENOUS
  Filled 2012-11-15 (×2): qty 5

## 2012-11-15 MED ORDER — ENOXAPARIN SODIUM 40 MG/0.4ML ~~LOC~~ SOLN
40.0000 mg | SUBCUTANEOUS | Status: DC
  Administered 2012-11-16 – 2012-11-18 (×3): 40 mg via SUBCUTANEOUS
  Filled 2012-11-15 (×4): qty 0.4

## 2012-11-15 MED ORDER — POTASSIUM CHLORIDE 10 MEQ/100ML IV SOLN
10.0000 meq | INTRAVENOUS | Status: AC
  Administered 2012-11-15 (×4): 10 meq via INTRAVENOUS
  Filled 2012-11-15 (×2): qty 100

## 2012-11-15 MED ORDER — ACETAMINOPHEN 325 MG PO TABS: 650.0000 mg | ORAL_TABLET | Freq: Four times a day (QID) | ORAL | Status: DC | PRN

## 2012-11-15 MED ORDER — LORAZEPAM 2 MG/ML IJ SOLN
INTRAMUSCULAR | Status: AC
  Administered 2012-11-15: 1 mg via INTRAVENOUS
  Filled 2012-11-15: qty 1

## 2012-11-15 MED ORDER — POTASSIUM CHLORIDE 10 MEQ/100ML IV SOLN
INTRAVENOUS | Status: AC
  Administered 2012-11-15: 10 meq via INTRAVENOUS
  Filled 2012-11-15: qty 100

## 2012-11-15 MED ORDER — SODIUM CHLORIDE 0.9 % IV SOLN
500.0000 mg | Freq: Two times a day (BID) | INTRAVENOUS | Status: DC
  Administered 2012-11-16 – 2012-11-17 (×4): 500 mg via INTRAVENOUS
  Filled 2012-11-15 (×6): qty 5

## 2012-11-15 MED ORDER — SODIUM CHLORIDE 0.9 % IJ SOLN
3.0000 mL | Freq: Two times a day (BID) | INTRAMUSCULAR | Status: DC
  Administered 2012-11-16 – 2012-11-18 (×2): 3 mL via INTRAVENOUS

## 2012-11-15 MED ORDER — ACETAMINOPHEN 650 MG RE SUPP: 650.0000 mg | Freq: Four times a day (QID) | RECTAL | Status: DC | PRN

## 2012-11-15 MED ORDER — LORAZEPAM 2 MG/ML IJ SOLN
INTRAMUSCULAR | Status: AC
  Administered 2012-11-15: 2 mg via INTRAVENOUS
  Filled 2012-11-15: qty 1

## 2012-11-15 MED ORDER — ONDANSETRON HCL 4 MG PO TABS: 4.0000 mg | ORAL_TABLET | Freq: Four times a day (QID) | ORAL | Status: DC | PRN

## 2012-11-15 NOTE — ED Notes (Signed)
MD at bedside.pt. Did whisper her name to Dr. Jenelle Mages to follow commands. Rt. Gaze.

## 2012-11-15 NOTE — ED Notes (Signed)
Patient transported to CT 

## 2012-11-15 NOTE — H&P (Addendum)
[facility] History and Physical  Julie Acevedo XLK:[id number] DOB: [birth year] DOA: 11/15/2012   PCP: Sheila Oats, MD   Chief Complaint: Mental status change, aphasia  HPI:  61 year old female with a history of hypertension, diabetes mellitus, diverticulitis, and gastric bypass surgery presented with aphasia. As per the history from [facility], the patient called EMS and upon their arrival, the patient was found a phasic but awake. Initial examination in the emergency department found the patient to remain a phasic and not following commands although the patient was awake. It appeared that the patient may have had a receptive aphasia. A code stroke was called. Neurology came to see the patient. They felt that the patient may be suffering from a temporal seizure. Ativan and fosphenytoin were given to the patient around 1400 hours. Apparently, the patient still remained dysphasic, but was able to follow commands sometime later.  When I went to evaluate the patient around 1700 hours, the patient was awake and able to communicate and follow commands, although she was somewhat slow and sleepy. The patient told me that she was in her usual state of health until this morning when she felt palpitations and fluttering in her chest, and she just felt weak all over which prompted a call to EMS. The patient denied any recent fevers, chills, chest pain, nausea, vomiting, diarrhea, headache, visual changes, focal extremity weakness, abdominal pain, dysuria. The patient's husband eventually arrived and told me that the patient has been in her usual state of health. In fact, the patient drove to the grocery store this morning without any difficulty. There has been no witnessed tonic-clonic activity or loss of consciousness.  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 -further management of  neurologic issues to neurology service -MRI of the brain -hold on obtaining Echo, carotids unless MRI is abnormal -I will work the patient up for acute encephalopathy to include labs for TSH, B12, ammonia, troponins, EKGs, and EEG -Urine toxicology was positive for benzodiazepine--obtained after the patient was given Ativan -Urinalysis does not suggest UTI -Check ABG rule out hypercarbia, hypoxemia as a cause for the patient's mentation -Start aspirin 325mg   Hypertension -bp controlled -Hold amlodipine and hydrochlorothiazide for now pending MRI results -Would allow for permissive hypertension if the patient had stroke, but clinical suspicion is low for stroke at this time Questionable hx History of stroke in right MCA territory -Previous MRIs of the brain on 07/26/2004 and 07/27/2006 were negative for stroke -Medical records show patient was admitted in April 2005 for stroke, but MRI imaging did not show diffusion weighted imaging suggestive of acute stroke in right MCA territory -patient currently is not on antiplatelet therapy for secondary prophylaxis Questionable history of myocardial infarction -Patient had admission on 01/27/2012 by Dr. Camelia Eng evaluation did not suggest any previous history of myocardial infarction -LexiScan on 08/29/2009 and was negative for any reversible ischemia -Patient had normal heart catheterization in 2000 Personal history of anaphylaxis to Lotrel -Patient takes amlodipine without any problems; suspect ACEi allergy Diabetes mellitus type 2 -Hold metformin -Hemoglobin A1c -Check lipids Hypokalemia -Likely due to hydrochlorothiazide -Check magnesium -Repeat potassium        Past Medical History  Diagnosis Date  . DM (diabetes mellitus)   . HTN (hypertension)   . Diverticulitis   . Arthritis   . Coronary artery disease   . Stroke   . Short-term memory loss    Past Surgical History  Procedure Date  . Hemicolectomy   . Lumbar disc  surgery   . Roux-en-y gastric bypass   . Abdominal hysterectomy   . Cholecystectomy    Social History:  reports that she has never smoked. She does not have any smokeless tobacco history on file. She reports that she does not drink alcohol or use illicit drugs.   Family History  Problem Relation Age of Onset  . Cancer Father 61    Lung  . COPD Mother   . Coronary artery disease Father 30     Allergies  Allergen Reactions  . Lotrel (Amlodipine Besy-Benazepril Hcl) Anaphylaxis  . Sulfa Antibiotics Hives      Prior to Admission medications   Medication Sig Start Date End Date Taking? Authorizing Provider  amLODipine (NORVASC) 10 MG tablet Take 10 mg by mouth daily.   Yes Historical Provider, MD  hydrochlorothiazide (HYDRODIURIL) 25 MG tablet Take 25 mg by mouth daily.   Yes Historical Provider, MD  metFORMIN (GLUCOPHAGE) 500 MG tablet Take 500 mg by mouth 2 (two) times daily with a meal.   Yes Historical Provider, MD  phentermine 37.5 MG capsule Take 37.5 mg by mouth daily.   Yes Historical Provider, MD    Review of Systems:  Constitutional:  No weight loss, night sweats, Fevers, chills, fatigue.  Head&Eyes: No headache.  No vision loss.  ENT:  No Difficulty swallowing,Tooth/dental problems,Sore throat,   Cardio-vascular:  No chest pain, Orthopnea, PND, swelling in lower extremities,  dizziness, palpitations  GI:  No  abdominal pain, nausea, vomiting, diarrhea, loss of appetite, hematochezia, melena, heartburn, indigestion, Resp:  No shortness of breath with exertion or at rest. No cough. No coughing up of blood .No wheezing.No chest wall deformity  Skin:  no rash or lesions.  GU:  no dysuria, change in color of urine, no urgency or frequency. No flank pain.  Musculoskeletal:  Complains of chronic back pain  Psych:  No change in mood or affect. No depression or anxiety. Neurologic: No headache, no dysesthesia, no focal weakness, no vision loss. No  syncope  Physical Exam: Filed Vitals:   11/15/12 1552 11/15/12 1600 11/15/12 1630 11/15/12 1700  BP: 115/57 118/63 110/58 132/64  Pulse: 89 81 69 83  Temp:      TempSrc:      Resp: 20 25 14    Weight:      SpO2: 99% 100% 100% 100%   General:  A&O x 3, NAD, nontoxic, pleasant/cooperative Head/Eye: No conjunctival hemorrhage, no icterus, [place]/AT, No nystagmus ENT:  No icterus,  No thrush, good dentition, no pharyngeal exudate Neck:  No masses, no lymphadenpathy, no bruits CV:  RRR, no rub, no gallop, no S3 Lung:  CTAB, good air movement, no wheeze, no rhonchi Abdomen: soft/NT, +BS, nondistended, no peritoneal signs Ext: No cyanosis, No rashes, No petechiae, No lymphangitis, No edema Neuro: CNII-XII intact, strength 4/5 in bilateral upper extremities, strength 4-/5 bilateral lower extremities no dysmetria; no facial droop. Facial sensation intact bilaterally. PERRL, EOMI; corneal reflex intact; pharynx rises symmetrically; no tongue deviation; sensation to epigastric and protopathic stimuli intact bilaterally; Babinski's response is flexor; DTR--3/4 bilateral upper and lower extremities  Labs on Admission:  Basic Metabolic Panel:  Lab 11/15/12 0981 11/15/12 1314  NA 143 140  K 3.2* 3.2*  CL 105 104  CO2 -- 26  GLUCOSE 158* 163*  BUN 10 11  CREATININE 0.90 0.78  CALCIUM -- 9.7  MG -- --  PHOS -- --   Liver Function  Tests:  Lab 11/15/12 1314  AST 17  ALT 13  ALKPHOS 128*  BILITOT 0.2*  PROT 7.5  ALBUMIN 3.5   No results found for this basename: LIPASE:5,AMYLASE:5 in the last 168 hours No results found for this basename: AMMONIA:5 in the last 168 hours CBC:  Lab 11/15/12 1335 11/15/12 1314  WBC -- 11.9*  NEUTROABS -- 9.3*  HGB 13.3 11.8*  HCT 39.0 37.1  MCV -- 83.9  PLT -- 277   Cardiac Enzymes: No results found for this basename: CKTOTAL:5,CKMB:5,CKMBINDEX:5,TROPONINI:5 in the last 168 hours BNP: No components found with this basename: POCBNP:5 CBG:  Lab  11/15/12 1259  GLUCAP 156*    Radiological Exams on Admission: Ct Head Wo Contrast  11/15/2012  *RADIOLOGY REPORT*  Clinical Data: Code stroke.  Acute mental status changes. Unresponsive.  Fixed caries.  Current history of hypertension and diabetes.  CT HEAD WITHOUT CONTRAST  Technique:  Contiguous axial images were obtained from the base of the skull through the vertex without contrast.  Comparison: MRI brain 07/27/2006 and unenhanced cranial CT of that same date.  Findings: Ventricular system normal in size and appearance for age. Mild changes of small vessel disease of the white matter diffusely, unchanged.  Physiologic calcifications in the basal ganglia bilaterally, unchanged.  No mass lesion.  No midline shift.  No acute hemorrhage or hematoma.  No extra-axial fluid collections. No evidence of acute infarction.  No significant interval change.  No skull fracture or other focal osseous abnormality involving the skull.  Visualized paranasal sinuses, bilateral mastoid air cells, and bilateral middle ear cavities well-aerated.  IMPRESSION:  1.  No acute intracranial abnormality. 2.  Mild chronic microvascular ischemic changes of the white matter diffusely, stable since August, 2007.  These results were called by telephone on 11/15/2012.  at 1350 hours to Dr. Amada Jupiter of Neurology, who verbally acknowledged these results.   Original Report Authenticated By: Hulan Saas, M.D.     EKG: Independently reviewed. Sinus tachycardia, heart rate 100, no ST-T wave changes    Time spent:70 minutes Code Status:   full Family Communication:   Family at bedside   Markel Mergenthaler, DO  [facility] Pager [phone]  If 7PM-7AM, please contact night-coverage [email or web address] Password [facility] 11/15/2012, 5:36 PM

## 2012-11-15 NOTE — ED Notes (Signed)
RN Ed communicated to cancel Code Stroke

## 2012-11-15 NOTE — ED Notes (Signed)
Per RN foley placed on hold due to patient becoming more coherent

## 2012-11-15 NOTE — ED Notes (Signed)
MD at bedside. 

## 2012-11-15 NOTE — Consult Note (Signed)
Reason for Consult:Altered Mental status Referring Physician: Judd Lien, D  CC: aphasia  History is obtained from:EMS, Daughter  HPI: Julie Acevedo is a 61 y.o. female who was normal until approximatly 11:30 am when she called EMS and told them that she had a funny feeling in her chest(? Palpitation). When EMS arrived she was found to be aphasiac and a code stroke was activated. On my arrival the patient was having lip smacking and intermittent right head turn with right eye deviation. She was given 2mg  ativan and continued having these symptoms. She would intermittently fixate and track, but then would deviate again. She was given another 1mg  ativan and slowly became more responsive. She was also loaded with dilantin. She first began following commands, then answered simple questions, but continued to show improvement in speech and mental status.   LSN: 11:30 am tPA given: no, not a stroke.   ROS: unable to obtain 2/2 AMS.   Past Medical History  Diagnosis Date  . DM (diabetes mellitus)   . HTN (hypertension)   . Diverticulitis   . Arthritis   . Coronary artery disease   . Stroke   . Short-term memory loss     Family History: Daughter - seizure disorder  Social History: Tob: none  Exam: Current vital signs: BP 117/68  Pulse 61  Temp 97.4 F (36.3 C) (Oral)  Resp 16  Wt 75.5 kg (166 lb 7.2 oz)  SpO2 100% Vital signs in last 24 hours: Temp:  [97.4 F (36.3 C)-97.7 F (36.5 C)] 97.4 F (36.3 C) (11/30 1835) Pulse Rate:  [61-115] 61  (11/30 1830) Resp:  [14-25] 16  (11/30 1830) BP: (110-160)/(57-79) 117/68 mmHg (11/30 1830) SpO2:  [99 %-100 %] 100 % (11/30 1830) Weight:  [75.5 kg (166 lb 7.2 oz)] 75.5 kg (166 lb 7.2 oz) (11/30 1303)  General: In bed, smacking lips.  CV: RRR Mental Status: Patient is awake, and will occasionally fixate, however this is interrupted by right eye deviation. She does not initially follow commands or speak.  Cranial Nerves: II: Blinks to  threat bilaterally. Pupils are equal, round, and reactive to light.  Discs are difficult to visualize. III,IV, VI: tracks across midline bilaterally, but intermittently will have right eye deviation.  V,VII: corneal intact VIII: hearing is intact to voice X: Uvula elevates symmetrically XI: Shoulder shrug is symmetric. XII: tongue is midline without atrophy or fasciculations.  Motor: Tone is normal. Bulk is normal. She moves all extremities well. She does have some mild tremulous movements of the right arm, but these abort with pinch and therefore not clearly ictal.  Sensory: Responds to noxious stimulation x4 Deep Tendon Reflexes: 2+ and symmetric in the biceps and patellae.  Plantars: Toes are downgoing bilaterally.  Cerebellar: Unable to test 2/2 aphasia Gait: Unable to test 2/2 ams   I have reviewed labs in epic and the results pertinent to this consultation are: Slightly low potassium. 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 At this time, she is speaking and following commands, and no further signs of seizure activity.  Though I loaded with fosphenytoin in the acute phase, I will change her to keppra for maintenance.   Her elevated WBC and "feeling bad" could suggest a recent infection which could lower seizure threshold in someone with a predisposition(such as old stroke). She will need an evaluation.   Recommendations: 1) Keppra 500mg  BID 2) EEG, MRI brain.  3) Will continue to follow.   Ritta Slot, MD [facility] [phone]  If 7pm- 7am,  please page neurology on call at [phone].

## 2012-11-15 NOTE — ED Notes (Signed)
Pt presents to department via [facility] for evaluation of altered mental status. Pt called EMS, reporting that she felt like her heart was racing. Upon EMS arrival pt noted to be confused, CBG 61, pt became unresponsive, began smacking lips and gazing to R side. Received 2.5 versed per EMS. Pt lethargic upon arrival, responding to pain. 20g LAC.

## 2012-11-15 NOTE — ED Notes (Signed)
MD at bedside.Admitting physician at bedside; update family re: plan of care.

## 2012-11-15 NOTE — ED Provider Notes (Signed)
History     CSN: [id number]  Arrival date & time 11/15/12  1256   First MD Initiated Contact with Patient 11/15/12 1301      Chief Complaint  Patient presents with  . Altered Mental Status    (Consider location/radiation/quality/duration/timing/severity/associated sxs/prior treatment) HPI Patient presents to the emergency department with altered mental status.  The patient, apparently, called 911 for palpitations.  Patient, is unable to give me any history.  Patient's eyes are open, but not able to talk or answer questions. Patient is aphasic Past Medical History  Diagnosis Date  . DM (diabetes mellitus)   . HTN (hypertension)   . Diverticulitis   . Arthritis   . Coronary artery disease   . Stroke   . Short-term memory loss     Past Surgical History  Procedure Date  . Hemicolectomy   . Lumbar disc surgery   . Roux-en-y gastric bypass   . Abdominal hysterectomy   . Cholecystectomy     Family History  Problem Relation Age of Onset  . Cancer Father 5    Lung  . COPD Mother   . Coronary artery disease Father 68    History  Substance Use Topics  . Smoking status: Never Smoker   . Smokeless tobacco: Not on file  . Alcohol Use: No    OB History    Grav Para Term Preterm Abortions TAB SAB Ect Mult Living                  Review of Systems Level V caveat applies due to altered mental status Allergies  Lotrel and Sulfa antibiotics  Home Medications   Current Outpatient Rx  Name  Route  Sig  Dispense  Refill  . AMLODIPINE BESYLATE 10 MG PO TABS   Oral   Take 10 mg by mouth daily.         Marland Kitchen HYDROCHLOROTHIAZIDE 25 MG PO TABS   Oral   Take 25 mg by mouth daily.         Marland Kitchen METFORMIN HCL 500 MG PO TABS   Oral   Take 500 mg by mouth 2 (two) times daily with a meal.         . PHENTERMINE HCL 37.5 MG PO CAPS   Oral   Take 37.5 mg by mouth daily.           BP 127/77  Pulse 91  Temp 97.7 F (36.5 C) (Oral)  Resp 18  Wt 166 lb 7.2 oz (75.5  kg)  SpO2 100%  Physical Exam  Constitutional: She appears well-developed and well-nourished. No distress.  HENT:  Head: Normocephalic and atraumatic.  Eyes: Pupils are equal, round, and reactive to light.  Cardiovascular: Normal rate and regular rhythm.   Pulmonary/Chest: Effort normal and breath sounds normal.  Neurological: She is alert.       Patient not able to perform any PE   Skin: Skin is warm and dry. No rash noted.    ED Course  Procedures (including critical care time)  Labs Reviewed  GLUCOSE, CAPILLARY - Abnormal; Notable for the following:    Glucose-Capillary 156 (*)     All other components within normal limits  CBC WITH DIFFERENTIAL - Abnormal; Notable for the following:    WBC 11.9 (*)     Hemoglobin 11.8 (*)     Neutrophils Relative 78 (*)     Neutro Abs 9.3 (*)     All other components within normal limits  COMPREHENSIVE  METABOLIC PANEL - Abnormal; Notable for the following:    Potassium 3.2 (*)     Glucose, Bld 163 (*)     Alkaline Phosphatase 128 (*)     Total Bilirubin 0.2 (*)     GFR calc non Af Amer 90 (*)     All other components within normal limits  POCT I-STAT, CHEM 8 - Abnormal; Notable for the following:    Potassium 3.2 (*)     Glucose, Bld 158 (*)     All other components within normal limits  ETHANOL  POCT I-STAT TROPONIN I  URINALYSIS, ROUTINE W REFLEX MICROSCOPIC  URINE RAPID DRUG SCREEN (HOSP PERFORMED)  URINALYSIS, MICROSCOPIC ONLY   Ct Head Wo Contrast  11/15/2012  *RADIOLOGY REPORT*  Clinical Data: Code stroke.  Acute mental status changes. Unresponsive.  Fixed caries.  Current history of hypertension and diabetes.  CT HEAD WITHOUT CONTRAST  Technique:  Contiguous axial images were obtained from the base of the skull through the vertex without contrast.  Comparison: MRI brain 07/27/2006 and unenhanced cranial CT of that same date.  Findings: Ventricular system normal in size and appearance for age. Mild changes of small vessel  disease of the white matter diffusely, unchanged.  Physiologic calcifications in the basal ganglia bilaterally, unchanged.  No mass lesion.  No midline shift.  No acute hemorrhage or hematoma.  No extra-axial fluid collections. No evidence of acute infarction.  No significant interval change.  No skull fracture or other focal osseous abnormality involving the skull.  Visualized paranasal sinuses, bilateral mastoid air cells, and bilateral middle ear cavities well-aerated.  IMPRESSION:  1.  No acute intracranial abnormality. 2.  Mild chronic microvascular ischemic changes of the white matter diffusely, stable since August, 2007.  These results were called by telephone on 11/15/2012.  at 1350 hours to Dr. Amada Jupiter of Neurology, who verbally acknowledged these results.   Original Report Authenticated By: Hulan Saas, M.D.    Code stroke called since she was aphasic and she was able to call 911. The patient Neurologist came down to see the patient.  MDM  MDM Reviewed: nursing note and vitals Interpretation: labs, CT scan and ECG Consults: neurology    Date: 11/15/2012  Rate: 100  Rhythm: sinus tachycardia  QRS Axis: normal  Intervals: normal  ST/T Wave abnormalities: normal  Conduction Disutrbances:none  Narrative Interpretation:   Old EKG Reviewed: unchanged            Carlyle Dolly, PA-C 11/15/12 1523

## 2012-11-16 ENCOUNTER — Inpatient Hospital Stay (HOSPITAL_COMMUNITY): Payer: Medicare PPO

## 2012-11-16 ENCOUNTER — Encounter (HOSPITAL_COMMUNITY): Payer: Self-pay | Admitting: *Deleted

## 2012-11-16 DIAGNOSIS — G40401 Other generalized epilepsy and epileptic syndromes, not intractable, with status epilepticus: Secondary | ICD-10-CM

## 2012-11-16 DIAGNOSIS — G40201 Localization-related (focal) (partial) symptomatic epilepsy and epileptic syndromes with complex partial seizures, not intractable, with status epilepticus: Secondary | ICD-10-CM

## 2012-11-16 LAB — LIPID PANEL
Cholesterol: 157 mg/dL (ref 0–200)
HDL: 79 mg/dL (ref >39)
LDL Cholesterol: 70 mg/dL (ref 0–99)
Total CHOL/HDL Ratio: 2 RATIO
Triglycerides: 40 mg/dL (ref <150)
VLDL: 8 mg/dL (ref 0–40)

## 2012-11-16 LAB — COMPREHENSIVE METABOLIC PANEL
ALT: 10 U/L (ref 0–35)
AST: 16 U/L (ref 0–37)
Albumin: 3.1 g/dL — ABNORMAL LOW (ref 3.5–5.2)
Alkaline Phosphatase: 119 U/L — ABNORMAL HIGH (ref 39–117)
BUN: 8 mg/dL (ref 6–23)
CO2: 28 mEq/L (ref 19–32)
Calcium: 9.3 mg/dL (ref 8.4–10.5)
Chloride: 103 mEq/L (ref 96–112)
Creatinine, Ser: 0.73 mg/dL (ref 0.50–1.10)
GFR calc Af Amer: >90 mL/min (ref >90)
GFR calc non Af Amer: >90 mL/min (ref >90)
Glucose, Bld: 104 mg/dL — ABNORMAL HIGH (ref 70–99)
Potassium: 4 mEq/L (ref 3.5–5.1)
Sodium: 141 mEq/L (ref 135–145)
Total Bilirubin: 0.4 mg/dL (ref 0.3–1.2)
Total Protein: 6.6 g/dL (ref 6.0–8.3)

## 2012-11-16 LAB — GLUCOSE, CAPILLARY
Glucose-Capillary: 106 mg/dL — ABNORMAL HIGH (ref 70–99)
Glucose-Capillary: 79 mg/dL (ref 70–99)
Glucose-Capillary: 175 mg/dL — ABNORMAL HIGH (ref 70–99)
Glucose-Capillary: 100 mg/dL — ABNORMAL HIGH (ref 70–99)

## 2012-11-16 LAB — HEMOGLOBIN A1C
Hgb A1c MFr Bld: 6.2 % — ABNORMAL HIGH (ref <5.7)
Mean Plasma Glucose: 131 mg/dL — ABNORMAL HIGH (ref <117)

## 2012-11-16 LAB — MAGNESIUM: Magnesium: 2 mg/dL (ref 1.5–2.5)

## 2012-11-16 LAB — TROPONIN I
Troponin I: <0.3 ng/mL (ref <0.30)
Troponin I: <0.3 ng/mL (ref <0.30)

## 2012-11-16 LAB — VITAMIN B12: Vitamin B-12: 831 pg/mL (ref 211–911)

## 2012-11-16 LAB — GAMMA GT: GGT: 37 U/L (ref 7–51)

## 2012-11-16 LAB — TSH: TSH: 1.504 u[IU]/mL (ref 0.350–4.500)

## 2012-11-16 LAB — AMMONIA: Ammonia: 68 umol/L — ABNORMAL HIGH (ref 11–60)

## 2012-11-16 MED ORDER — INSULIN ASPART 100 UNIT/ML ~~LOC~~ SOLN
0.0000 [IU] | Freq: Three times a day (TID) | SUBCUTANEOUS | Status: DC
  Administered 2012-11-16: 2 [IU] via SUBCUTANEOUS

## 2012-11-16 MED ORDER — INFLUENZA VIRUS VACC SPLIT PF IM SUSP
0.5000 mL | INTRAMUSCULAR | Status: AC
  Filled 2012-11-16: qty 0.5

## 2012-11-16 MED ORDER — PNEUMOCOCCAL VAC POLYVALENT 25 MCG/0.5ML IJ INJ
0.5000 mL | INJECTION | INTRAMUSCULAR | Status: AC
  Administered 2012-11-17: 0.5 mL via INTRAMUSCULAR
  Filled 2012-11-16: qty 0.5

## 2012-11-16 MED ORDER — SODIUM CHLORIDE 0.9 % IV SOLN
INTRAVENOUS | Status: DC
  Administered 2012-11-16 – 2012-11-17 (×3): via INTRAVENOUS
  Administered 2012-11-17: 1000 mL via INTRAVENOUS

## 2012-11-16 NOTE — ED Provider Notes (Signed)
Medical screening examination/treatment/procedure(s) were conducted as a shared visit with non-physician practitioner(Chris Lawyer) and myself.  I personally evaluated the patient during the encounter.  The patient called 911 complaining of palpitations.  When EMS arrived, she was altered in her mental status, not responding.  Her eyes were open and there was gaze deviation noted and "lip smacking".  She adds no other history secondary to mental status.    On exam, the patient is afebrile and the vitals are stable.  She is not responding to commands.  Her eyes are open but she has a blank stare.  She will not focus her eyes on me.  The heart is regular rate and rhythm and the lungs are clear.  The abd is benign.  Neuro exam as above, but is limited secondary to mental status.    When the patient arrived to the ER, a code stroke was initiated due to the abruptness and timing of her change in mental status.  The head ct was okay and the labs were unremarkable.  She was seen by neurology who believed that she was in status epilepticus.  She was given both ativan and dilantin.  She was observed and seemed to improve.  She was drowsy but came to the point where she was able to follow commands and speak.  She will be admitted to the medicine service.    CRITICAL CARE Performed by: Geoffery Lyons   Total critical care time: 30 minutes  Critical care time was exclusive of separately billable procedures and treating other patients.  Critical care was necessary to treat or prevent imminent or life-threatening deterioration.  Critical care was time spent personally by me on the following activities: development of treatment plan with patient and/or surrogate as well as nursing, discussions with consultants, evaluation of patient's response to treatment, examination of patient, obtaining history from patient or surrogate, ordering and performing treatments and interventions, ordering and review of laboratory  studies, ordering and review of radiographic studies, pulse oximetry and re-evaluation of patient's condition.   Geoffery Lyons, MD 11/16/12 [phone]

## 2012-11-16 NOTE — Evaluation (Signed)
Clinical/Bedside Swallow Evaluation Patient Details  Name: Julie Acevedo MRN: [id number] Date of Birth: [birth year]  Today's Date: 11/16/2012 Time: 1200-1230 SLP Time Calculation (min): 30 min  Past Medical History:  Past Medical History  Diagnosis Date  . DM (diabetes mellitus)   . HTN (hypertension)   . Diverticulitis   . Arthritis   . Coronary artery disease   . Stroke   . Short-term memory loss    Past Surgical History:  Past Surgical History  Procedure Date  . Hemicolectomy   . Lumbar disc surgery   . Roux-en-y gastric bypass   . Abdominal hysterectomy   . Cholecystectomy    HPI:  61 year old female with a history of hypertension, diabetes mellitus, diverticulitis, and gastric bypass surgery presented with aphasia. As per the history from [facility], the patient called EMS and upon their arrival, the patient was found a phasic but awake. Initial examination in the emergency department found the patient to remain a phasic and not following commands although the patient was awake. It appeared that the patient may have had a receptive aphasia. A code stroke was called. Neurology came to see the patient. They felt that the patient may be suffering from a temporal seizure. Ativan and fosphenytoin were given to the patient around 1400 hours. Apparently, the patient still remained dysphasic, but was able to follow commands sometime later.  BSE indicated per stroke protocol.    Assessment / Plan / Recommendation Clinical Impression  Oropharyngeal swallow funtional for regular consistency and thin liquids.  No observed s/s of aspiration noted throughout evaluation. Proceed with regular consistency diet and thin liquids with intermittent supervision due to noted difficulty with self feeding due to  upper extremity weakness.  ST to sign off as education complete.      Aspiration Risk  Mild    Diet Recommendation Regular;Thin liquid   Liquid Administration via:  Cup Medication Administration: Whole meds with liquid Supervision: Intermittent supervision to cue for compensatory strategies;Patient able to self feed Postural Changes and/or Swallow Maneuvers: Seated upright 90 degrees;Upright 30-60 min after meal    Other  Recommendations Oral Care Recommendations: Oral care BID Other Recommendations: Clarify dietary restrictions   Follow Up Recommendations  None            Swallow Study    General Date of Onset: 11/15/12 HPI: 61 year old female with a history of hypertension, diabetes mellitus, diverticulitis, and gastric bypass surgery presented with aphasia. As per the history from [facility], the patient called EMS and upon their arrival, the patient was found a phasic but awake. Initial examination in the emergency department found the patient to remain a phasic and not following commands although the patient was awake. It appeared that the patient may have had a receptive aphasia. A code stroke was called. Neurology came to see the patient. They felt that the patient may be suffering from a temporal seizure. Ativan and fosphenytoin were given to the patient around 1400 hours. Apparently, the patient still remained dysphasic, but was able to follow commands sometime later. Type of Study: Bedside swallow evaluation Previous Swallow Assessment: BSE 2004 Diet Prior to this Study: NPO Temperature Spikes Noted: N/A Respiratory Status: Room air History of Recent Intubation: No Behavior/Cognition: Alert;Cooperative;Pleasant mood Oral Cavity - Dentition: Adequate natural dentition Self-Feeding Abilities: Needs assist;Able to feed self Patient Positioning: Upright in bed Baseline Vocal Quality: Clear;Low vocal intensity Volitional Cough: Strong Volitional Swallow: Able to elicit    Oral/Motor/Sensory Function Overall Oral Motor/Sensory Function:  Appears within functional limits for tasks assessed   Ice Chips Ice chips: Within functional  limits Presentation: Spoon   Thin Liquid Thin Liquid: Within functional limits Presentation: Cup    Nectar Thick Nectar Thick Liquid: Not tested   Honey Thick Honey Thick Liquid: Not tested   Puree Puree: Within functional limits Presentation: Spoon   Solid   GO    Solid: Within functional limits Presentation: Self Lorretta Harp MS, CCC-SLP [phone] [facility] 11/16/2012,4:03 PM

## 2012-11-16 NOTE — Progress Notes (Signed)
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  - continue keppra, await EEG, and MRI -continue ASA -appreciate neuro assistance Hypertension  -bp reamins controlled  -follow and resume amlodipine and hydrochlorothiazide pending MRI results  Questionable hx History of stroke in right MCA territory  -Previous MRIs of the brain on 07/26/2004 and 07/27/2006 were negative for stroke  -Medical records show patient was admitted in April 2005 for stroke, but MRI imaging did not show diffusion weighted imaging suggestive of acute stroke in right MCA territory  -patient currently was not on antiplatelet therapy for secondary prophylaxis  Questionable history of myocardial infarction  -Patient had admission on 01/27/2012 by Dr. 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 Denies dizziness, speech fluent  Objective: Filed Vitals:   11/15/12 2206 11/16/12 0223 11/16/12 0259 11/16/12 0623  BP: 113/57 113/57 102/59 114/68  Pulse: 86 86 66 74  Temp: 97.8 F (36.6 C) 97.8  F (36.6 C) 97.8 F (36.6 C) 97.9 F (36.6 C)  TempSrc: Oral Oral Oral Oral  Resp: 18 18 20 20   Weight:  75.5 kg (166 lb 7.2 oz)    SpO2: 100% 100% 100% 100%   No intake or output data in the 24 hours ending 11/16/12 1014 Filed Weights   11/15/12 1303 11/16/12 0223  Weight: 75.5 kg (166 lb 7.2 oz) 75.5 kg (166 lb 7.2 oz)    Exam:   General: A&Ox3, in NAD  Cardiovascular: RRR  Respiratory: CTAB  Abdomen:soft+BSNT/ND  Data Reviewed: Basic Metabolic Panel:  Lab 11/16/12 8119 11/15/12 2115 11/15/12 1335 11/15/12 1314  NA 141 -- 143 140  K 4.0 -- 3.2* 3.2*  CL 103 -- 105 104  CO2 28 -- -- 26  GLUCOSE 104* -- 158* 163*  BUN 8 -- 10 11  CREATININE 0.73 -- 0.90 0.78  CALCIUM 9.3 -- -- 9.7  MG 2.0 1.9 -- --  PHOS -- -- -- --   Liver Function Tests:  Lab 11/16/12 0500 11/15/12 1314  AST 16 17  ALT 10 13  ALKPHOS 119* 128*  BILITOT 0.4 0.2*  PROT 6.6 7.5  ALBUMIN 3.1* 3.5   No results found for this basename: LIPASE:5,AMYLASE:5 in the last 168 hours  Lab 11/15/12 2339  AMMONIA 68*   CBC:  Lab 11/15/12 1335 11/15/12 1314  WBC -- 11.9*  NEUTROABS -- 9.3*  HGB 13.3 11.8*  HCT 39.0 37.1  MCV -- 83.9  PLT -- 277   Cardiac Enzymes:  Lab 11/16/12 0625 11/15/12 2338  CKTOTAL -- --  CKMB -- --  CKMBINDEX -- --  TROPONINI <0.30 <0.30   BNP (last 3 results) No results found for this basename: PROBNP:3 in the  last 8760 hours CBG:  Lab 11/16/12 0824 11/15/12 1259  GLUCAP 106* 156*    No results found for this or any previous visit (from the past 240 hour(s)).   Studies: Ct Head Wo Contrast  11/15/2012  *RADIOLOGY REPORT*  Clinical Data: Code stroke.  Acute mental status changes. Unresponsive.  Fixed caries.  Current history of hypertension and diabetes.  CT HEAD WITHOUT CONTRAST  Technique:  Contiguous axial images were obtained from the base of the skull through the vertex without contrast.  Comparison: MRI brain 07/27/2006 and unenhanced cranial CT of  that same date.  Findings: Ventricular system normal in size and appearance for age. Mild changes of small vessel disease of the white matter diffusely, unchanged.  Physiologic calcifications in the basal ganglia bilaterally, unchanged.  No mass lesion.  No midline shift.  No acute hemorrhage or hematoma.  No extra-axial fluid collections. No evidence of acute infarction.  No significant interval change.  No skull fracture or other focal osseous abnormality involving the skull.  Visualized paranasal sinuses, bilateral mastoid air cells, and bilateral middle ear cavities well-aerated.  IMPRESSION:  1.  No acute intracranial abnormality. 2.  Mild chronic microvascular ischemic changes of the white matter diffusely, stable since August, 2007.  These results were called by telephone on 11/15/2012.  at 1350 hours to Dr. Amada Jupiter of Neurology, who verbally acknowledged these results.   Original Report Authenticated By: Hulan Saas, M.D.    Dg Chest Port 1 View  11/16/2012  *RADIOLOGY REPORT*  Clinical Data: Mental status changes.  PORTABLE CHEST - 1 VIEW  Comparison: 01/27/2012  Findings: Lungs are clear and show no evidence of edema or infiltrate.  Heart size is stable and within normal limits.  No pleural effusions are seen.  IMPRESSION: No acute findings.   Original Report Authenticated By: Irish Lack, M.D.     Scheduled Meds:   . aspirin EC  325 mg Oral Daily  . enoxaparin (LOVENOX) injection  40 mg Subcutaneous Q24H  . [COMPLETED] fosPHENYtoin (CEREBYX) IV  20 mg PE/kg Intravenous Once  . influenza  inactive virus vaccine  0.5 mL Intramuscular Tomorrow-1000  . levetiracetam  500 mg Intravenous Q12H  . [COMPLETED] LORazepam      . [COMPLETED] LORazepam  1 mg Intravenous Once  . pneumococcal 23 valent vaccine  0.5 mL Intramuscular Tomorrow-1000  . [COMPLETED] potassium chloride  10 mEq Intravenous Q1 Hr x 3  . sodium chloride  3 mL Intravenous Q12H  . [DISCONTINUED] levetiracetam  500 mg  Intravenous Q12H   Continuous Infusions:   Active Problems:  Acute encephalopathy  Dysphasia  DM2 (diabetes mellitus, type 2)  Complex partial status epilepticus    Time spent:    Kela Millin  [facility] Pager [phone]. If 8PM-8AM, please contact night-coverage at [email or web address], password [facility] 11/16/2012, 10:14 AM  LOS: 1 day

## 2012-11-16 NOTE — Progress Notes (Signed)
Subjective: Patient is much improved this morning. Asks me what happened to get her into the hospital. Does not remember calling EMS yesterday, but does remember feeling palpitations and having nausea.    She is able to tell me that it was her right side that was weak following her previous stroke.   Exam: Filed Vitals:   11/16/12 0623  BP: 114/68  Pulse: 74  Temp: 97.9 F (36.6 C)  Resp: 20   Gen: In bed, NAD MS: Awake, Alert, oriented to person place and time(gives month as November, but seeing as it is the first, will accept).  ZO:XWRUE, EOMI, VFF mild right facial droop Motor: 5/5 throughout, though unable to grip due to osteoarthritis. Mild drift in right arm.  Gait/Station: able to sit on side of bed, but when attempting to stand, prevented by foley and oxygen, so unable to assess gait.   Impression: 61 yo F with new onset seizures, presenting with partial status epilepticus. Her semiology is consistent with a left temporal focus and given that with her previous stroke, she had right weakness and memory difficulty after her previous stroke which would be consistent with a lesion that could cause these seizures. She has markedly improved, no signs of infection.    Recommendations: 1) MRI, EEG 2) Continue keppra 500mg  BID.   Ritta Slot, MD [facility] [phone]  If 7pm- 7am, please page neurology on call at [phone].

## 2012-11-17 ENCOUNTER — Ambulatory Visit (HOSPITAL_COMMUNITY): Payer: Medicare PPO

## 2012-11-17 LAB — GLUCOSE, CAPILLARY
Glucose-Capillary: 73 mg/dL (ref 70–99)
Glucose-Capillary: 83 mg/dL (ref 70–99)
Glucose-Capillary: 67 mg/dL — ABNORMAL LOW (ref 70–99)
Glucose-Capillary: 91 mg/dL (ref 70–99)

## 2012-11-17 NOTE — Progress Notes (Signed)
Subjective: Patient feels back to her normal self today.   Exam: Filed Vitals:   11/17/12 0602  BP: 118/63  Pulse: 75  Temp: 98.3 F (36.8 C)  Resp: 20   Gen: In bed, NAD MS: Awake, Alert, oriented to person place and time(gives month as November, but seeing as it is the first, will accept).  HQ:IONGE, EOMI, VFF face symmetric.  Motor: 5/5 throughout, though unable to grip due to osteoarthritis. No drift.    Impression: 61 yo F with new onset seizures, presenting with partial status epilepticus. Her previous "stroke" in 2205 consisted for right sided tingling and confusion. I suspect that she had a seizure at that time as well. I susp[ect her drift on the right yesterday was a prolonged todd's as it has resolved.   Recommendations: 1) MRI negative 2) Continue keppra 500mg  BID.  3) will f/u EEG.  4) OOB to chair.   Ritta Slot, MD [facility] [phone]  If 7pm- 7am, please page neurology on call at [phone].

## 2012-11-17 NOTE — Progress Notes (Signed)
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  - continue keppra, MRI for acute infarct, await EEG -continue ASA -appreciate neuro assistance Hypertension  -bp reamins controlled  -follow and resume amlodipine and hydrochlorothiazide pending MRI results  Questionable hx History of stroke in right MCA territory  -Previous MRIs of the brain on 07/26/2004 and 07/27/2006 were negative for stroke  -Medical records show patient was admitted in April 2005 for stroke, but MRI imaging did not show diffusion weighted imaging suggestive of acute stroke in right MCA territory  -patient currently was not on antiplatelet therapy for secondary prophylaxis  Questionable history of myocardial infarction  -Patient had admission on 01/27/2012 by Dr. 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  Objective: Filed Vitals:   11/16/12 1832 11/16/12 2228 11/17/12 0238 11/17/12 0602  BP: 120/69 107/62 100/74 118/63  Pulse: 76 74 76 75  Temp: 98.2 F  (36.8 C) 98.3 F (36.8 C) 98.4 F (36.9 C) 98.3 F (36.8 C)  TempSrc: Oral Oral Oral Oral  Resp: 18 18 20 20   Weight:      SpO2: 100% 97% 100% 100%    Intake/Output Summary (Last 24 hours) at 11/17/12 1007 Last data filed at 11/16/12 1704  Gross per 24 hour  Intake      0 ml  Output   2000 ml  Net  -2000 ml   Filed Weights   11/15/12 1303 11/16/12 0223  Weight: 75.5 kg (166 lb 7.2 oz) 75.5 kg (166 lb 7.2 oz)    Exam:   General: A&Ox3, in NAD  Cardiovascular: RRR  Respiratory: CTAB  Abdomen:soft+BSNT/ND  Neuro: Alert and oriented x3, normal strength, sensory grossly intact. Nonfocal.  Data Reviewed: Basic Metabolic Panel:  Lab 11/16/12 4259 11/15/12 2115 11/15/12 1335 11/15/12 1314  NA 141 -- 143 140  K 4.0 -- 3.2* 3.2*  CL 103 -- 105 104  CO2 28 -- -- 26  GLUCOSE 104* -- 158* 163*  BUN 8 -- 10 11  CREATININE 0.73 -- 0.90 0.78  CALCIUM 9.3 -- -- 9.7  MG 2.0 1.9 -- --  PHOS -- -- -- --   Liver Function Tests:  Lab 11/16/12 0500 11/15/12 1314  AST 16 17  ALT 10 13  ALKPHOS 119* 128*  BILITOT 0.4 0.2*  PROT 6.6 7.5  ALBUMIN 3.1* 3.5   No results found for this basename: LIPASE:5,AMYLASE:5 in the last 168 hours  Lab 11/15/12 2339  AMMONIA 68*   CBC:  Lab 11/15/12 1335 11/15/12 1314  WBC -- 11.9*  NEUTROABS -- 9.3*  HGB 13.3 11.8*  HCT 39.0 37.1  MCV -- 83.9  PLT -- 277   Cardiac Enzymes:  Lab  11/16/12 0625 11/15/12 2338  CKTOTAL -- --  CKMB -- --  CKMBINDEX -- --  TROPONINI <0.30 <0.30   BNP (last 3 results) No results found for this basename: PROBNP:3 in the last 8760 hours CBG:  Lab 11/17/12 0652 11/16/12 2232 11/16/12 1614 11/16/12 1140 11/16/12 0824  GLUCAP 73 100* 175* 79 106*    No results found for this or any previous visit (from the past 240 hour(s)).   Studies: Ct Head Wo Contrast  11/15/2012  *RADIOLOGY REPORT*  Clinical Data: Code stroke.  Acute mental status changes. Unresponsive.  Fixed caries.  Current history  of hypertension and diabetes.  CT HEAD WITHOUT CONTRAST  Technique:  Contiguous axial images were obtained from the base of the skull through the vertex without contrast.  Comparison: MRI brain 07/27/2006 and unenhanced cranial CT of that same date.  Findings: Ventricular system normal in size and appearance for age. Mild changes of small vessel disease of the white matter diffusely, unchanged.  Physiologic calcifications in the basal ganglia bilaterally, unchanged.  No mass lesion.  No midline shift.  No acute hemorrhage or hematoma.  No extra-axial fluid collections. No evidence of acute infarction.  No significant interval change.  No skull fracture or other focal osseous abnormality involving the skull.  Visualized paranasal sinuses, bilateral mastoid air cells, and bilateral middle ear cavities well-aerated.  IMPRESSION:  1.  No acute intracranial abnormality. 2.  Mild chronic microvascular ischemic changes of the white matter diffusely, stable since August, 2007.  These results were called by telephone on 11/15/2012.  at 1350 hours to Dr. Amada Jupiter of Neurology, who verbally acknowledged these results.   Original Report Authenticated By: Hulan Saas, M.D.    [name]  11/16/2012  *RADIOLOGY REPORT*  Clinical Data: Acute encephalopathy.  Aphasia  MRI HEAD WITHOUT CONTRAST  Technique:  Multiplanar, multiecho pulse sequences of the brain and surrounding structures were obtained according to standard protocol without intravenous contrast.  Comparison: CT head 11/15/2012  Findings: Negative for acute infarct.  Scattered small hyperintensities in the cerebral white matter bilaterally compatible with chronic microvascular ischemia.  No cortical infarct.  Brainstem and cerebellum are normal.  Negative for hemorrhage or mass lesion.  Vessels at the base of the brain are patent.  Paranasal sinuses are clear.  IMPRESSION: Mild chronic microvascular ischemic change.  No acute infarct.   Original  Report Authenticated By: Janeece Riggers, M.D.    Dg Chest Port 1 View  11/16/2012  *RADIOLOGY REPORT*  Clinical Data: Mental status changes.  PORTABLE CHEST - 1 VIEW  Comparison: 01/27/2012  Findings: Lungs are clear and show no evidence of edema or infiltrate.  Heart size is stable and within normal limits.  No pleural effusions are seen.  IMPRESSION: No acute findings.   Original Report Authenticated By: Irish Lack, M.D.     Scheduled Meds:    . aspirin EC  325 mg Oral Daily  . enoxaparin (LOVENOX) injection  40 mg Subcutaneous Q24H  . influenza  inactive virus vaccine  0.5 mL Intramuscular Tomorrow-1000  . insulin aspart  0-9 Units Subcutaneous TID WC  . levetiracetam  500 mg Intravenous Q12H  . pneumococcal 23 valent vaccine  0.5 mL Intramuscular Tomorrow-1000  . sodium chloride  3 mL Intravenous Q12H   Continuous Infusions:    . sodium chloride 75 mL/hr at 11/17/12 0746    Active Problems:  Acute encephalopathy  Dysphasia  DM2 (diabetes mellitus, type 2)  Complex partial status epilepticus  Time spent:    Avelino Herren C  [facility] Pager [phone]. If 8PM-8AM, please contact night-coverage at [email or web address], password [facility] 11/17/2012, 10:07 AM  LOS: 2 days

## 2012-11-18 ENCOUNTER — Inpatient Hospital Stay (HOSPITAL_COMMUNITY): Payer: Medicare PPO

## 2012-11-18 LAB — GLUCOSE, CAPILLARY
Glucose-Capillary: 149 mg/dL — ABNORMAL HIGH (ref 70–99)
Glucose-Capillary: 122 mg/dL — ABNORMAL HIGH (ref 70–99)
Glucose-Capillary: 61 mg/dL — ABNORMAL LOW (ref 70–99)

## 2012-11-18 MED ORDER — ASPIRIN 325 MG PO TBEC: 325.0000 mg | DELAYED_RELEASE_TABLET | Freq: Every day | ORAL | Status: DC

## 2012-11-18 MED ORDER — LEVETIRACETAM 500 MG PO TABS
500.0000 mg | ORAL_TABLET | Freq: Two times a day (BID) | ORAL | Status: DC
Start: 2012-11-18 — End: 2012-11-18
  Administered 2012-11-18: 500 mg via ORAL
  Filled 2012-11-18 (×2): qty 1

## 2012-11-18 MED ORDER — LEVETIRACETAM 500 MG PO TABS: 500.0000 mg | ORAL_TABLET | Freq: Two times a day (BID) | ORAL | Status: DC

## 2012-11-18 NOTE — Discharge Summary (Signed)
Physician Discharge Summary  Julie Acevedo ZOX:[id number] DOB: Dec 21, 1950 DOA: 11/15/2012  PCP: Sheila Oats, MD  Admit date: 11/15/2012 Discharge date: 11/18/2012  Time spent: >30 minutes  Recommendations for Outpatient Follow-up:      Follow-up Information    Follow up with Levert Feinstein, MD. (in 1-76mos, call for appt upon discharge)    Contact information:   [address] [place] [address] [phone]       Please follow up. (PCP in 1-2weeks, call for appt upon discharge)         Pending studies-EEG, she is to followup with neurology outpatient. Discharge Diagnoses:  Active Problems:  Complex partial status epilepticus/Seizure  Acute encephalopathy  Dysphasia  DM2 (diabetes mellitus, type 2)    Discharge Condition: Improved/stable  Diet recommendation: Modified carbohydrate  Filed Weights   11/15/12 1303 11/16/12 0223  Weight: 75.5 kg (166 lb 7.2 oz) 75.5 kg (166 lb 7.2 oz)    History of present illness:  61 year old female with a history of hypertension, diabetes mellitus, diverticulitis, and gastric bypass surgery presented with aphasia. As per the history from [facility], the patient called EMS and upon their arrival, the patient was found a phasic but awake. Initial examination in the emergency department found the patient to remain a phasic and not following commands although the patient was awake. It appeared that the patient may have had a receptive aphasia. A code stroke was called. Neurology came to see the patient. They felt that the patient may be suffering from a temporal seizure. Ativan and fosphenytoin were given to the patient around 1400 hours. Apparently, the patient still remained dysphasic, but was able to follow commands sometime later.  When the admitting M.D. went to back to evaluate the patient around 1700 hours, the patient was awake and able to communicate and follow commands, although she was somewhat slow and sleepy. she  reported she was in her usual state of health until this morning when she felt palpitations and fluttering in her chest, and she just felt weak all over which prompted a call to EMS. The patient denied any recent fevers, chills, chest pain, nausea, vomiting, diarrhea, headache, visual changes, focal extremity weakness, abdominal pain, dysuria. The patient's husband eventually arrived and admitting MD that the patient has been in her usual state of health. In fact, the patient drove to the grocery store this morning without any difficulty. There has been no witnessed tonic-clonic activity or loss of consciousness. She was admitted for further evaluation and management.     Hospital Course:  Dysphasia and Altered Mental Status/Probable seizure  -As discussed above, upon admission Neurology was consulted and evaluated patient and the impression was that she had had a temporal lobe seizure. She was placed on Keppra on admission. An an MRI was ordered and came back with no acute infarct. An EEG was done and the results are pending at the time of discharge-per Dr. Amada Jupiter patient is to follow up outpatient with neurology for EEG results and further monitoring/management. She has not had any further seizure-like episodes in the hospital.  Hypertension  -bp reamins controlled  -she is to continue amlodipine and hydrochlorothiazide upon discharge.  Questionable hx History of stroke in right MCA territory  -Previous MRIs of the brain on 07/26/2004 and 07/27/2006 were negative for stroke  -Medical records show patient was admitted in April 2005 for stroke, but MRI imaging did not show diffusion weighted imaging suggestive of acute stroke in right MCA territory  - she  was placed on aspirin and this hospital stay.  Questionable history of myocardial infarction  -Patient had admission on 01/27/2012 by Dr. 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  Diabetes mellitus type 2  -Her metformin was held in the hospital, Accu-Cheks were monitored and she was covered with sliding scale insulin. She is to continue her metformin upon discharge.  Hypokalemia  -Likely due to hydrochlorothiazide, resolved      Procedures:   EEG -results pending, she is to follow up with neurology outpatient   Consultations:  Neurology  Discharge Exam: Filed Vitals:   11/17/12 1848 11/18/12 0240 11/18/12 0900 11/18/12 1500  BP: 118/61 117/67  115/70  Pulse: 75 68  79  Temp: 97.9 F (36.6 C) 98.2 F (36.8 C)  97.4 F (36.3 C)  TempSrc: Oral Oral  Oral  Resp: 19 18  18   Height:   5' 2.99" (1.6 m)   Weight:      SpO2: 99% 98%  100%    Exam:  General: A&Ox3, in NAD  Cardiovascular: RRR  Respiratory: CTAB  Abdomen:soft+BSNT/ND  Neuro: Alert and oriented x3, normal strength, sensory grossly intact. Nonfocal.     Discharge Instructions  Discharge Orders    Future Orders Please Complete By Expires   Diet Carb Modified      Increase activity slowly          Medication List     As of 11/18/2012  4:18 PM    STOP taking these medications         phentermine 37.5 MG capsule      TAKE these medications         amLODipine 10 MG tablet   Commonly known as: NORVASC   Take 10 mg by mouth daily.      hydrochlorothiazide 25 MG tablet   Commonly known as: HYDRODIURIL   Take 25 mg by mouth daily.      levETIRAcetam 500 MG tablet   Commonly known as: KEPPRA   Take 1 tablet (500 mg total) by mouth 2 (two) times daily.      metFORMIN 500 MG tablet   Commonly known as: GLUCOPHAGE   Take 500 mg by mouth 2 (two) times daily with a meal.       aspirin 325 mg by mouth daily     Follow-up  Information    Follow up with Levert Feinstein, MD. (in 1-93mos, call for appt upon discharge)    Contact information:   [address] [place] [address] [phone]       Please follow up. (PCP in 1-2weeks, call for appt upon discharge)           The results of significant diagnostics from this hospitalization (including imaging, microbiology, ancillary and laboratory) are listed below for reference.    Significant Diagnostic Studies: Ct Head Wo Contrast  11/15/2012  *RADIOLOGY REPORT*  Clinical Data: Code stroke.  Acute mental status changes. Unresponsive.  Fixed caries.  Current history of hypertension and diabetes.  CT HEAD WITHOUT CONTRAST  Technique:  Contiguous axial images were obtained from the base of the skull through the vertex without contrast.  Comparison: MRI brain 07/27/2006 and unenhanced cranial CT of that same date.  Findings:  Ventricular system normal in size and appearance for age. Mild changes of small vessel disease of the white matter diffusely, unchanged.  Physiologic calcifications in the basal ganglia bilaterally, unchanged.  No mass lesion.  No midline shift.  No acute hemorrhage or hematoma.  No extra-axial fluid collections. No evidence of acute infarction.  No significant interval change.  No skull fracture or other focal osseous abnormality involving the skull.  Visualized paranasal sinuses, bilateral mastoid air cells, and bilateral middle ear cavities well-aerated.  IMPRESSION:  1.  No acute intracranial abnormality. 2.  Mild chronic microvascular ischemic changes of the white matter diffusely, stable since August, 2007.  These results were called by telephone on 11/15/2012.  at 1350 hours to Dr. Amada Jupiter of Neurology, who verbally acknowledged these results.   Original Report Authenticated By: Hulan Saas, M.D.    [name]  11/16/2012  *RADIOLOGY REPORT*  Clinical Data: Acute encephalopathy.  Aphasia  MRI HEAD WITHOUT CONTRAST   Technique:  Multiplanar, multiecho pulse sequences of the brain and surrounding structures were obtained according to standard protocol without intravenous contrast.  Comparison: CT head 11/15/2012  Findings: Negative for acute infarct.  Scattered small hyperintensities in the cerebral white matter bilaterally compatible with chronic microvascular ischemia.  No cortical infarct.  Brainstem and cerebellum are normal.  Negative for hemorrhage or mass lesion.  Vessels at the base of the brain are patent.  Paranasal sinuses are clear.  IMPRESSION: Mild chronic microvascular ischemic change.  No acute infarct.   Original Report Authenticated By: Janeece Riggers, M.D.    Dg Chest Port 1 View  11/16/2012  *RADIOLOGY REPORT*  Clinical Data: Mental status changes.  PORTABLE CHEST - 1 VIEW  Comparison: 01/27/2012  Findings: Lungs are clear and show no evidence of edema or infiltrate.  Heart size is stable and within normal limits.  No pleural effusions are seen.  IMPRESSION: No acute findings.   Original Report Authenticated By: Irish Lack, M.D.     Microbiology: No results found for this or any previous visit (from the past 240 hour(s)).   Labs: Basic Metabolic Panel:  Lab 11/16/12 1610 11/15/12 2115 11/15/12 1335 11/15/12 1314  NA 141 -- 143 140  K 4.0 -- 3.2* 3.2*  CL 103 -- 105 104  CO2 28 -- -- 26  GLUCOSE 104* -- 158* 163*  BUN 8 -- 10 11  CREATININE 0.73 -- 0.90 0.78  CALCIUM 9.3 -- -- 9.7  MG 2.0 1.9 -- --  PHOS -- -- -- --   Liver Function Tests:  Lab 11/16/12 0500 11/15/12 1314  AST 16 17  ALT 10 13  ALKPHOS 119* 128*  BILITOT 0.4 0.2*  PROT 6.6 7.5  ALBUMIN 3.1* 3.5   No results found for this basename: LIPASE:5,AMYLASE:5 in the last 168 hours  Lab 11/15/12 2339  AMMONIA 68*   CBC:  Lab 11/15/12 1335 11/15/12 1314  WBC -- 11.9*  NEUTROABS -- 9.3*  HGB 13.3 11.8*  HCT 39.0 37.1  MCV -- 83.9  PLT -- 277   Cardiac Enzymes:  Lab 11/16/12 0625 11/15/12 2338  CKTOTAL --  --  CKMB -- --  CKMBINDEX -- --  TROPONINI <0.30 <0.30   BNP: BNP (last 3 results) No results found for this basename: PROBNP:3 in the last 8760 hours CBG:  Lab 11/18/12 1209 11/18/12 1143 11/18/12 0843 11/17/12 2152 11/17/12 1626  GLUCAP 122* 61* 149* 91 67*       Signed:  Kolter Reaver C  [facility]  11/18/2012, 4:18 PM

## 2012-11-18 NOTE — Progress Notes (Addendum)
Subjective: Patient continues to feel back to her normal self today.   Exam: Filed Vitals:   11/18/12 0240  BP: 117/67  Pulse: 68  Temp: 98.2 F (36.8 C)  Resp: 18   Gen: In bed, NAD MS: Awake, Alert, oriented to person place and month.  WU:JWJXB, EOMI, VFF face symmetric.  Motor: 5/5 throughout, though unable to grip on left due to osteoarthritis. No drift.  Gait: Mildly shuffling, but patient states that this is baseline for her. She feels steady.    Impression: 61 yo F presenting with partial status epilepticus. Her semiology is most consistent with a left temporal focus. Her previous "stroke" in 2005 consisted for right sided tingling and confusion. I suspect that she had a seizure at that time as well. I suspect her drift on the right yesterday was a prolonged todd's as it has resolved.   Recommendations: 1) MRI negative 2) Does not need to wait on EEG results to be discharged.  3) Continue keppra 500mg  BID. 4) Follow up with neurology in 1 - 2 months. Neurology will sign off at this time, please call with any further questions.  5) Seizure precautions including no driving for 6 months, bathing alone, swimming alone, standing near open flames, climbing to heights were discussed with the patient.   Ritta Slot, MD [facility] [phone]  If 7pm- 7am, please page neurology on call at [phone].

## 2012-11-18 NOTE — Progress Notes (Signed)
Discharge instructions given. IV DC'd. Patient also instructed to continue ASA 325 mg at discharge. Pt states understanding.  Minor, Julie Acevedo

## 2012-11-18 NOTE — Procedures (Signed)
History: 61 yo F admitted with partial statsu epilepticus consisting of right head tunring and deviation as well as lipsmacking and aphasia.   Sedation: None  Background: There is a well defined posterior dominant rhythm of 9 Hz that attenuates with eye opening.   Photic stimulation: Physiologic driving is Present  EEG Diagnosis: 1) Normal EEG  Clinical Interpretation: This normal EEG is recorded in the waking and drowsystate. There was no seizure or seizure predisposition recorded on this study.   Ritta Slot, MD [facility] [phone]  If 7pm- 7am, please page neurology on call at [phone].

## 2012-11-18 NOTE — Progress Notes (Signed)
Hypoglycemic Event  CBG: 61  Treatment: 15 GM carbohydrate snack  Symptoms: tired  Follow-up CBG: Time:1209 CBG Result:122  Possible Reasons for Event: Unknown  Comments/MD notified: pt with acceptable blood sugar after treatment    Minor, Morrie Sheldon Javani Spratt  Remember to initiate Hypoglycemia Order Set & complete

## 2012-11-18 NOTE — Evaluation (Signed)
Physical Therapy Evaluation Patient Details Name: Julie Acevedo MRN: [id number] DOB: [birth year] Today's Date: 11/18/2012 Time: 4696-2952 PT Time Calculation (min): 24 min  PT Assessment / Plan / Recommendation Clinical Impression  61 yo adm with aphasia and Rt sided weakness. Diagnosed as Lt temporal seizure with Todd's paralysis. Pt with poor effort with muscle testing and inconsistent functional strength and balance.Is safe to go home with supervision of family members. Discussed use of her cane if she continues to feel weak (she has additional DME from prior back surgery). Discussed anticipated spontaneous recovery with Todd's paralysis and no further PT needed at this time. Pt aware she can discuss further PT needs when she sees her doctor in follow-up after d/c. Pt in agreement with this plan.    PT Assessment  Patent does not need any further PT services    Follow Up Recommendations  No PT follow up;Supervision - Intermittent    Does the patient have the potential to tolerate intense rehabilitation      Barriers to Discharge        Equipment Recommendations  None recommended by PT    Recommendations for Other Services     Frequency      Precautions / Restrictions Precautions Precautions: Fall   Pertinent Vitals/Pain Denies pain      Mobility  Bed Mobility Bed Mobility: Not assessed Transfers Transfers: Sit to Stand;Stand to Sit Sit to Stand: 7: Independent;Without upper extremity assist;From chair/3-in-1 Stand to Sit: 7: Independent;Without upper extremity assist;To chair/3-in-1 Ambulation/Gait Ambulation/Gait Assistance: 4: Min assist;5: Supervision Ambulation Distance (Feet): 200 Feet Assistive device: None Ambulation/Gait Assistance Details: Pt initially stagger-stepped to her Lt and then Rt, requiring min assist to recover. With encouragement, her gait improved (including velocity, turns, quick stops) and was supervision on return to room. Gait Pattern:  Step-through pattern;Right flexed knee in stance Stairs: Yes Stairs Assistance: 4: Min guard Stair Management Technique: No rails;Two rails;Step to pattern;Forwards Number of Stairs: 15  (5x 3)    Shoulder Instructions     Exercises     PT Diagnosis:    PT Problem List:   PT Treatment Interventions:     PT Goals    Visit Information  Last PT Received On: 11/18/12 Assistance Needed: +1    Subjective Data  Subjective: pt reports she does not feel she's back to 100%; Rt side feels weak Patient Stated Goal: go home   Prior Functioning  Home Living Lives With: Spouse;Daughter Available Help at Discharge: Family;Available PRN/intermittently Type of Home: House Home Access: Stairs to enter [employer] of Steps: 2 Entrance Stairs-Rails: None Home Layout: One level Bathroom Shower/Tub: Tub/shower unit;Door [employer] Toilet: Standard Bathroom Accessibility: Yes How Accessible: Accessible via walker Home Adaptive Equipment: Walker - rolling;Straight cane;Bedside commode/3-in-1;Shower chair with back Prior Function Level of Independence: Needs assistance Needs Assistance: Light Housekeeping Able to Take Stairs?: Reciprically Driving: Yes Comments: has trouble with arthritis; help with heavier housework Communication Communication: No difficulties Dominant Hand: Left    Cognition  Overall Cognitive Status: Appears within functional limits for tasks assessed/performed Arousal/Alertness: Awake/alert Orientation Level: Appears intact for tasks assessed Behavior During Session: Anxious Cognition - Other Comments: Pt reporting weakness in Rt extremities and becomes anxious when asked to perform functional tasks in standing    Extremity/Trunk Assessment Right Lower Extremity Assessment RLE ROM/Strength/Tone: Deficits RLE ROM/Strength/Tone Deficits: AROM WNL; Pt with poor effort and inconsistent results with muscle testing; 3+/5 knee extension and ankle DF, yet pt  without foot drop with gait and able  to step down steps supported on her Rt leg (leading with left leg) without buckling; when asked to step down supported by LLE (leading with RLE), pt's RLE buckles and catches herself without falling RLE Sensation: WFL - Light Touch RLE Coordination: WFL - gross motor Left Lower Extremity Assessment LLE ROM/Strength/Tone: Within functional levels LLE Sensation: WFL - Light Touch LLE Coordination: WFL - gross motor Trunk Assessment Trunk Assessment: Normal   Balance Balance Balance Assessed: Yes Static Standing Balance Static Standing - Balance Support: No upper extremity supported Static Standing - Level of Assistance: 5: Stand by assistance Static Standing - Comment/# of Minutes: x 1 minute; + sway anterior/posterior (decreased when asked to close her eyes) [place] - Eyes Opened: 30   End of Session PT - End of Session Equipment Utilized During Treatment: Gait belt Activity Tolerance: Patient tolerated treatment well Patient left: in chair;with call bell/phone within reach Nurse Communication: Mobility status;Other (comment) (OK for d/c home; no f/u needs)  GP     Deshonda Cryderman 11/18/2012, 3:57 PM  Pager [phone]

## 2012-11-18 NOTE — Progress Notes (Signed)
EEG completed once duplicate ordered

## 2013-01-06 ENCOUNTER — Other Ambulatory Visit: Payer: Self-pay | Admitting: Family Medicine

## 2013-03-16 ENCOUNTER — Ambulatory Visit: Payer: Self-pay | Admitting: Family Medicine

## 2013-04-21 ENCOUNTER — Telehealth (INDEPENDENT_AMBULATORY_CARE_PROVIDER_SITE_OTHER): Payer: Self-pay | Admitting: General Surgery

## 2013-04-21 NOTE — Telephone Encounter (Signed)
Patient called stating at the time of her gastric bypass surgery Dr Johna Sheriff told her that he found a hernia that would need repaired. She is coming in to speak with him about this but wanted to find out if she would have to stay overnight for this surgery. I told her it depends on how large the hernia is and how much pain she has after surgery. I told her she would have more answers after she is examined by Dr Johna Sheriff and she discusses it with him. She will wait for her appt and call with any problems.

## 2013-05-06 ENCOUNTER — Ambulatory Visit (INDEPENDENT_AMBULATORY_CARE_PROVIDER_SITE_OTHER): Payer: Medicare PPO | Admitting: General Surgery

## 2013-05-06 ENCOUNTER — Telehealth (INDEPENDENT_AMBULATORY_CARE_PROVIDER_SITE_OTHER): Payer: Self-pay | Admitting: General Surgery

## 2013-05-06 ENCOUNTER — Encounter (INDEPENDENT_AMBULATORY_CARE_PROVIDER_SITE_OTHER): Payer: Self-pay | Admitting: General Surgery

## 2013-05-06 VITALS — BP 140/72 | HR 76 | Temp 97.5°F | Resp 18 | Ht 63.0 in | Wt 156.8 lb

## 2013-05-06 DIAGNOSIS — R1032 Left lower quadrant pain: Secondary | ICD-10-CM

## 2013-05-06 DIAGNOSIS — Z9884 Bariatric surgery status: Secondary | ICD-10-CM | POA: Insufficient documentation

## 2013-05-06 NOTE — Progress Notes (Signed)
Subjective:   left lower quadrant abdominal pain, history of gastric bypass and ventral hernia  Patient ID: Julie Acevedo, female   DOB: 13-Jun-1951, 62 y.o.   MRN: [id number]  HPI Patient is a 62 year old female well known to me status post gastric bypass in 2011 for morbid obesity and comorbidities including hypertension and insulin-dependent diabetes mellitus. I have not seen her in 3 years but in regards to her bypass she has done very well. She has maintained excellent weight loss and has markedly improved diabetes off of insulin and Actos and now just on metformin and also off of the majority of her antihypertensives. The patient presents to the office today due to persistent left lower quadrant pain. She points out accurately that at the time of her gastric bypass she was found to have a ventral hernia along her old low midline incision with a history of colon surgery. This was done for diverticulitis. A piece of Vicryl mesh was placed at the time of her gastric bypass to prevent postop incarceration and she was told she may well need a definitive hernia repair at a later date. For about the past 1 year she has had gradually worsening left lower quadrant discomfort and now pain. She describes aching pain in her left lower quadrant that is broad along with physical activities such as lifting or straining and also brought on by bowel movements. In the last 15 minutes to an hour and then gradually subside with rest. It is getting somewhat worse over time. She is able to eat with appropriate restriction and no nausea or vomiting and the pain is unrelated to eating. No diarrhea or constipation, melena or hematochezia. She has not noted a lump or bulge in her abdominal wall.  Past Medical History  Diagnosis Date  . DM (diabetes mellitus)   . HTN (hypertension)   . Diverticulitis   . Arthritis   . Coronary artery disease   . Stroke   . Short-term memory loss   . Myocardial infarction    Past Surgical  History  Procedure Laterality Date  . Hemicolectomy    . Lumbar disc surgery    . Roux-en-y gastric bypass    . Abdominal hysterectomy    . Cholecystectomy    . Eye surgery    . Hand surgery    . Foot surgery     Current Outpatient Prescriptions  Medication Sig Dispense Refill  . amLODipine (NORVASC) 10 MG tablet Take 10 mg by mouth daily.      Marland Kitchen aspirin EC 325 MG EC tablet Take 1 tablet (325 mg total) by mouth daily.  30 tablet  0  . hydrochlorothiazide (HYDRODIURIL) 25 MG tablet Take 25 mg by mouth daily.      . metFORMIN (GLUCOPHAGE) 500 MG tablet Take 500 mg by mouth 2 (two) times daily with a meal.       No current facility-administered medications for this visit.   Allergies  Allergen Reactions  . Lotrel (Amlodipine Besy-Benazepril Hcl) Anaphylaxis  . Sulfa Antibiotics Hives   History  Substance Use Topics  . Smoking status: Never Smoker   . Smokeless tobacco: Never Used  . Alcohol Use: No     Review of Systems  Constitutional: Negative.   Respiratory: Negative.   Cardiovascular: Negative.   Gastrointestinal: Positive for abdominal pain. Negative for nausea, vomiting, diarrhea, constipation, blood in stool, abdominal distention, anal bleeding and rectal pain.  Musculoskeletal: Positive for back pain and arthralgias.  Neurological: Negative for syncope and  weakness.       Objective:   Physical Exam BP 140/72  Pulse 76  Temp(Src) 97.5 F (36.4 C) (Temporal)  Resp 18  Ht 5\' 3"  (1.6 m)  Wt 156 lb 12.8 oz (71.124 kg)  BMI 27.78 kg/m2 Exam: Well-appearing African American female in no distress Skin: No rash or infection HEENT: No palpable masses or thyromegaly. Sclera nonicteric. Lymph nodes: No cervical, subclavicular or inguinal nodes palpable Lungs: Clear equal breath sounds bilaterally without wheezing or increased work of breathing Cardiovascular: Regular rate and rhythm. No murmurs. Trace bilateral ankle edema. Abdomen: Healed low midline incision.  There is a very mild soft midline incisional hernia just above the umbilicus that is nontender and easily reducible. I do not feel any sort of mass or hernia the left lower quadrant. With the patient lying there is some tenderness in the left lower quadrant. No organomegaly. Extremities: Trace edema. No joint swelling or deformity Neurologic: Alert and fully oriented. Gait normal.    Assessment:     Status post gastric bypass 2011 with excellent maintenance of weight loss and significant improvement of comorbidities of diabetes and hypertension. She has persistent left lower quadrant discomfort which is related to activity but also to bowel movements. She does have a midline incisional hernia on exam but this is well away from the area of pain and minimal. Not at all sure that this would explain her pain. It has been 10 years since her last colonoscopy and as her pain is clearly related to bowel movements I think initially a colonoscopy would be a good first step. If this is unrevealing I would obtain a CT scan of the abdomen to evaluate her abdominal wall and also look for other causes such as diverticulitis. We will make a referral for her for a GI evaluation to consider colonoscopy and I will be in touch with her following this study.     Plan:     As above

## 2013-05-06 NOTE — Telephone Encounter (Signed)
Spoke with patient she is aware of appt with Dr Ewing Schlein  05/22/13 3:15

## 2013-05-06 NOTE — Patient Instructions (Signed)
We will be in touch after we have results of your colonoscopy to talk about the next step. Please call me if you have not heard anything a week or 2 after your colonoscopy.

## 2013-06-03 LAB — HM COLONOSCOPY

## 2013-06-15 ENCOUNTER — Encounter (INDEPENDENT_AMBULATORY_CARE_PROVIDER_SITE_OTHER): Payer: Self-pay

## 2013-06-23 IMAGING — CR RIGHT HAND - COMPLETE 3+ VIEW
1 series · 3 of 3 positions shown · non-contrast
Comparison: none

REASON FOR EXAM: hand pain and swelling over MCPs
COMMENTS:

PROCEDURE:     DXR - DXR HAND RT COMPLETE W/OBLIQUES  - June 19, 2011  [DATE]
RESULT:     There is no evidence of fracture, dislocation, or malalignment.
There has been amputation of the fifth digit to the level of the distal
fifth metacarpal.

[Series 1: view not recorded · 0.17mm/px · 3 of 3 slices shown]
[im 1/3]
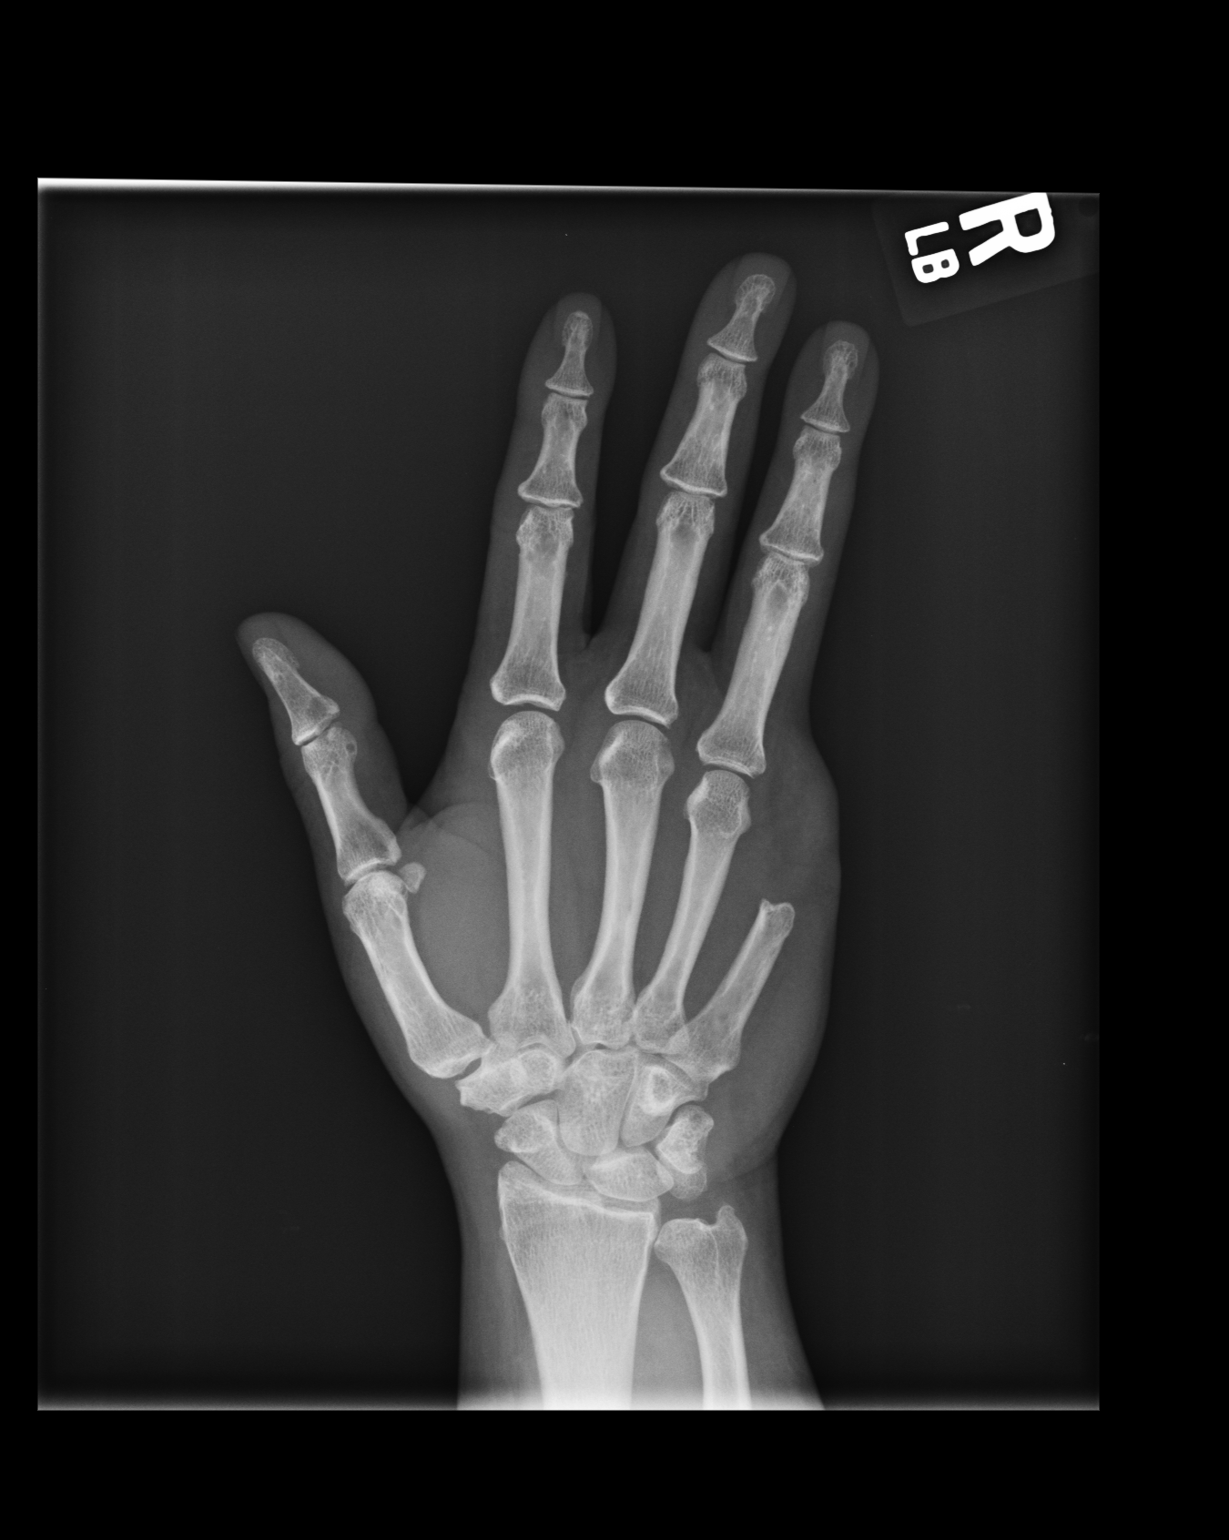
[im 2/3]
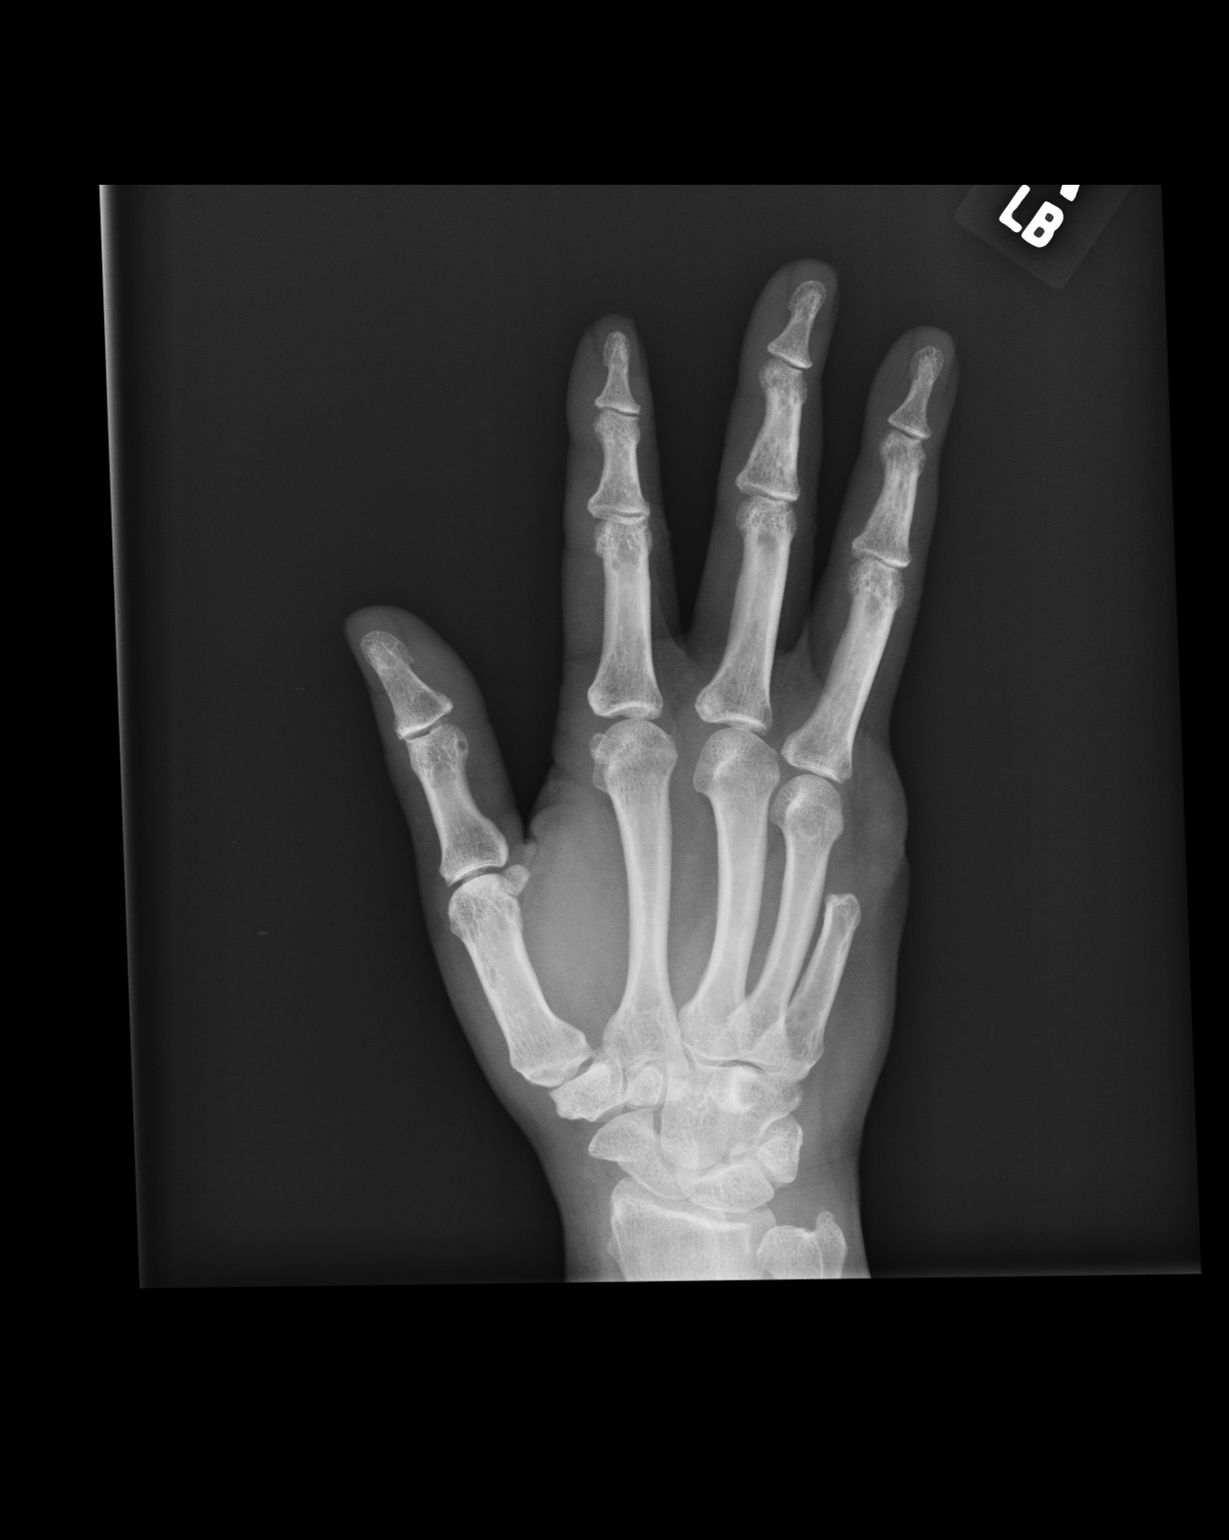
[im 3/3]
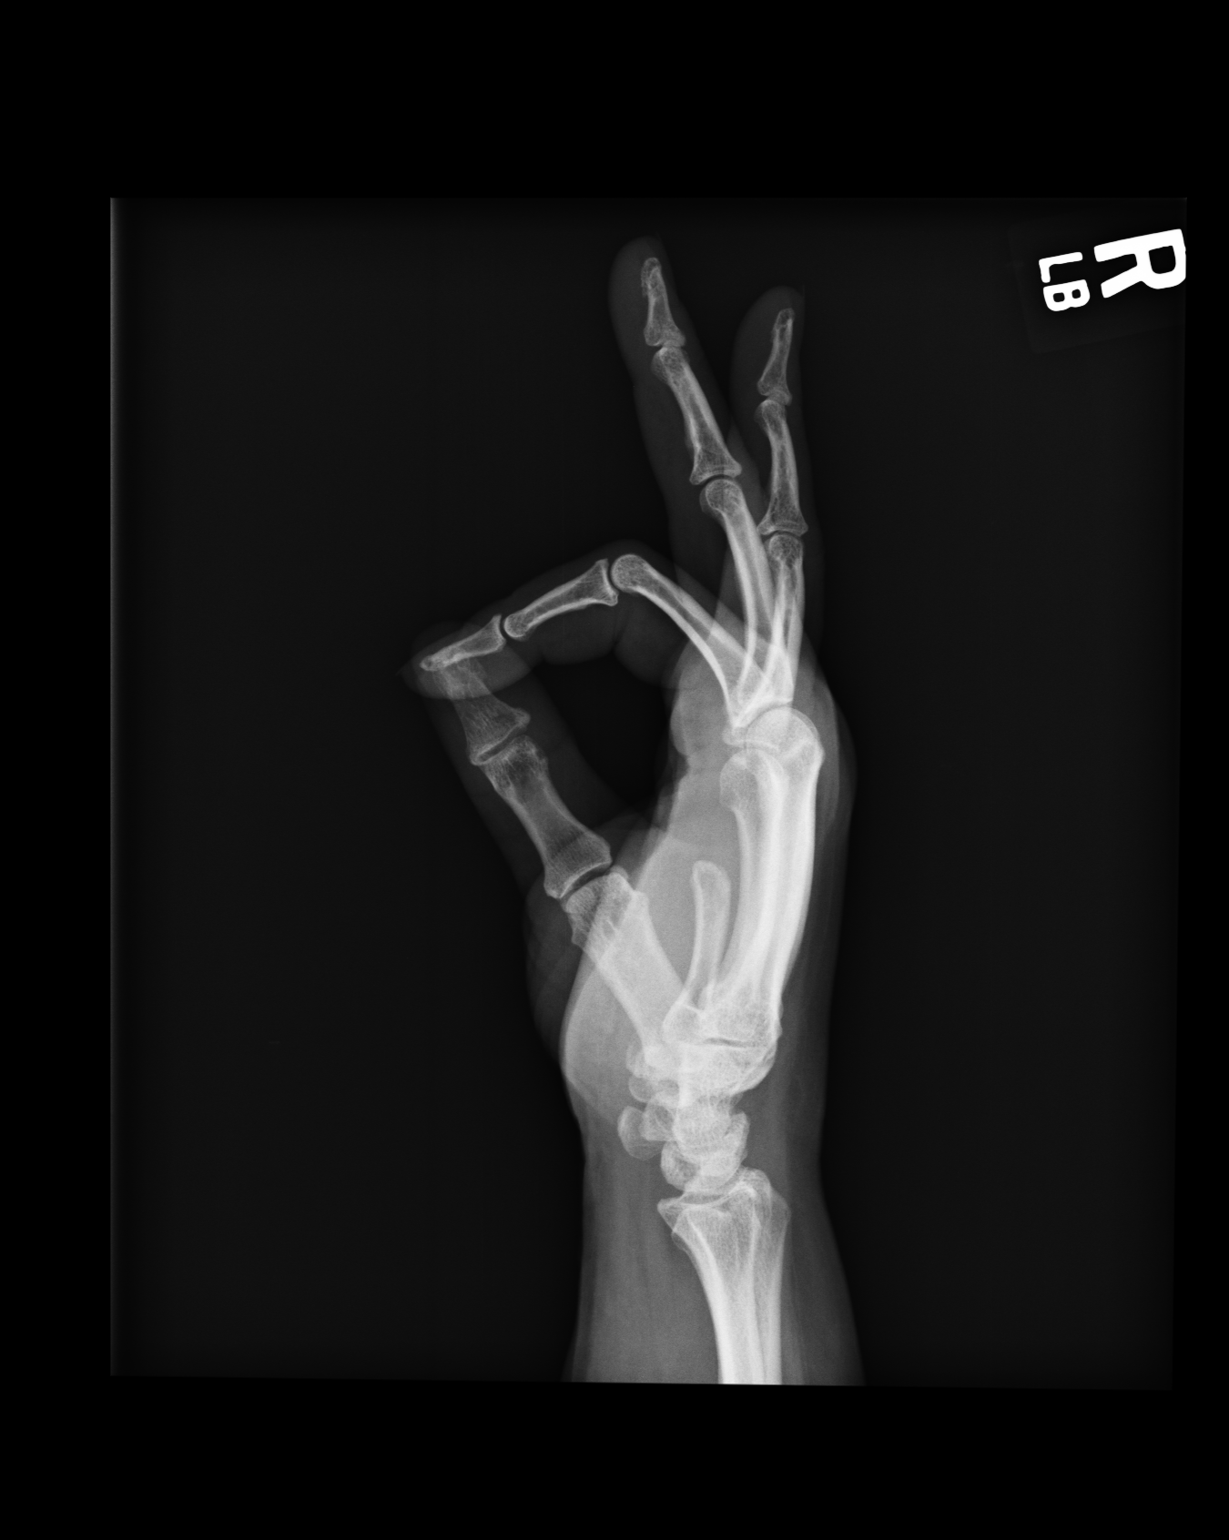

[3 of 3 positions shown; findings below may reference images not displayed]

IMPRESSION: 1. No evidence of acute abnormalities.
2. If there are persistent complaints of pain or persistent clinical
concern, a repeat evaluation in 7-10 days is recommended if clinically
warranted.

## 2013-07-27 IMAGING — CR DG THORACIC SPINE 2-3V
1 series · 2 of 2 positions shown · non-contrast
Comparison: none

REASON FOR EXAM: back pain
COMMENTS:

[Series 1: view not recorded · 0.17mm/px · 2 of 2 slices shown]
[im 1/2]
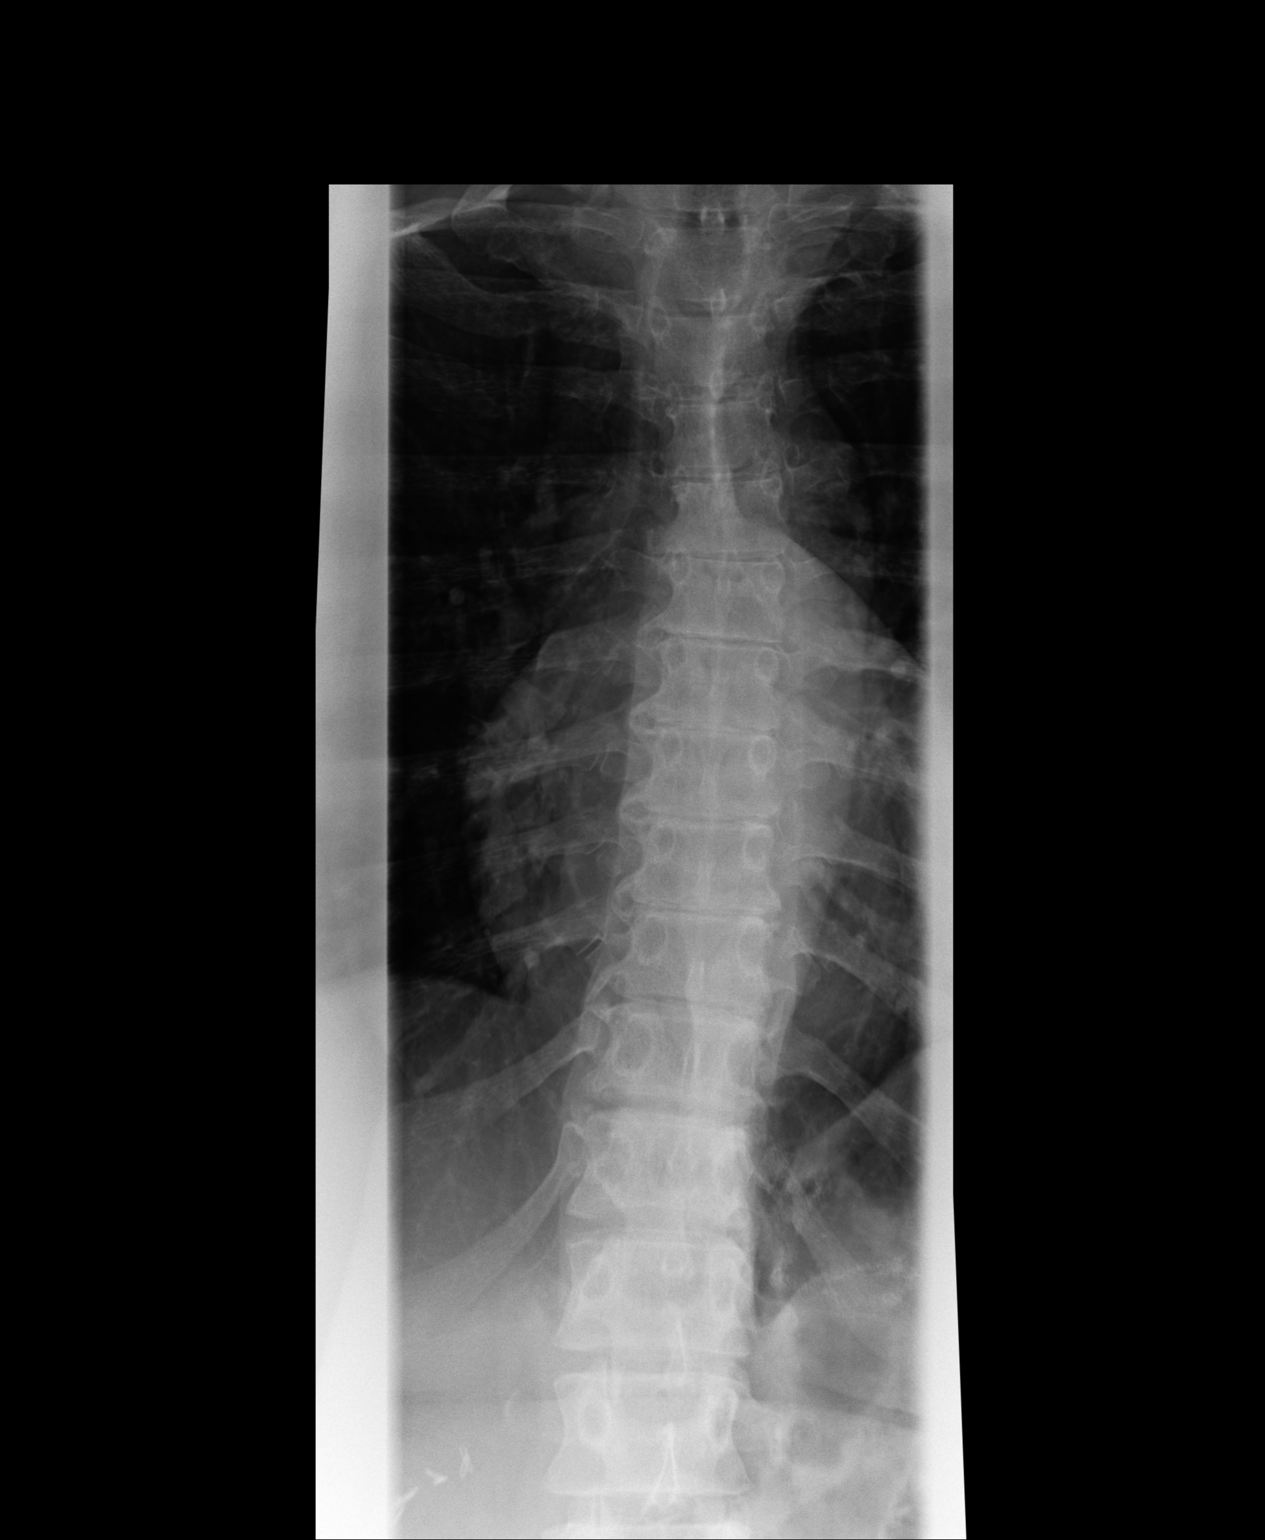
[im 2/2]
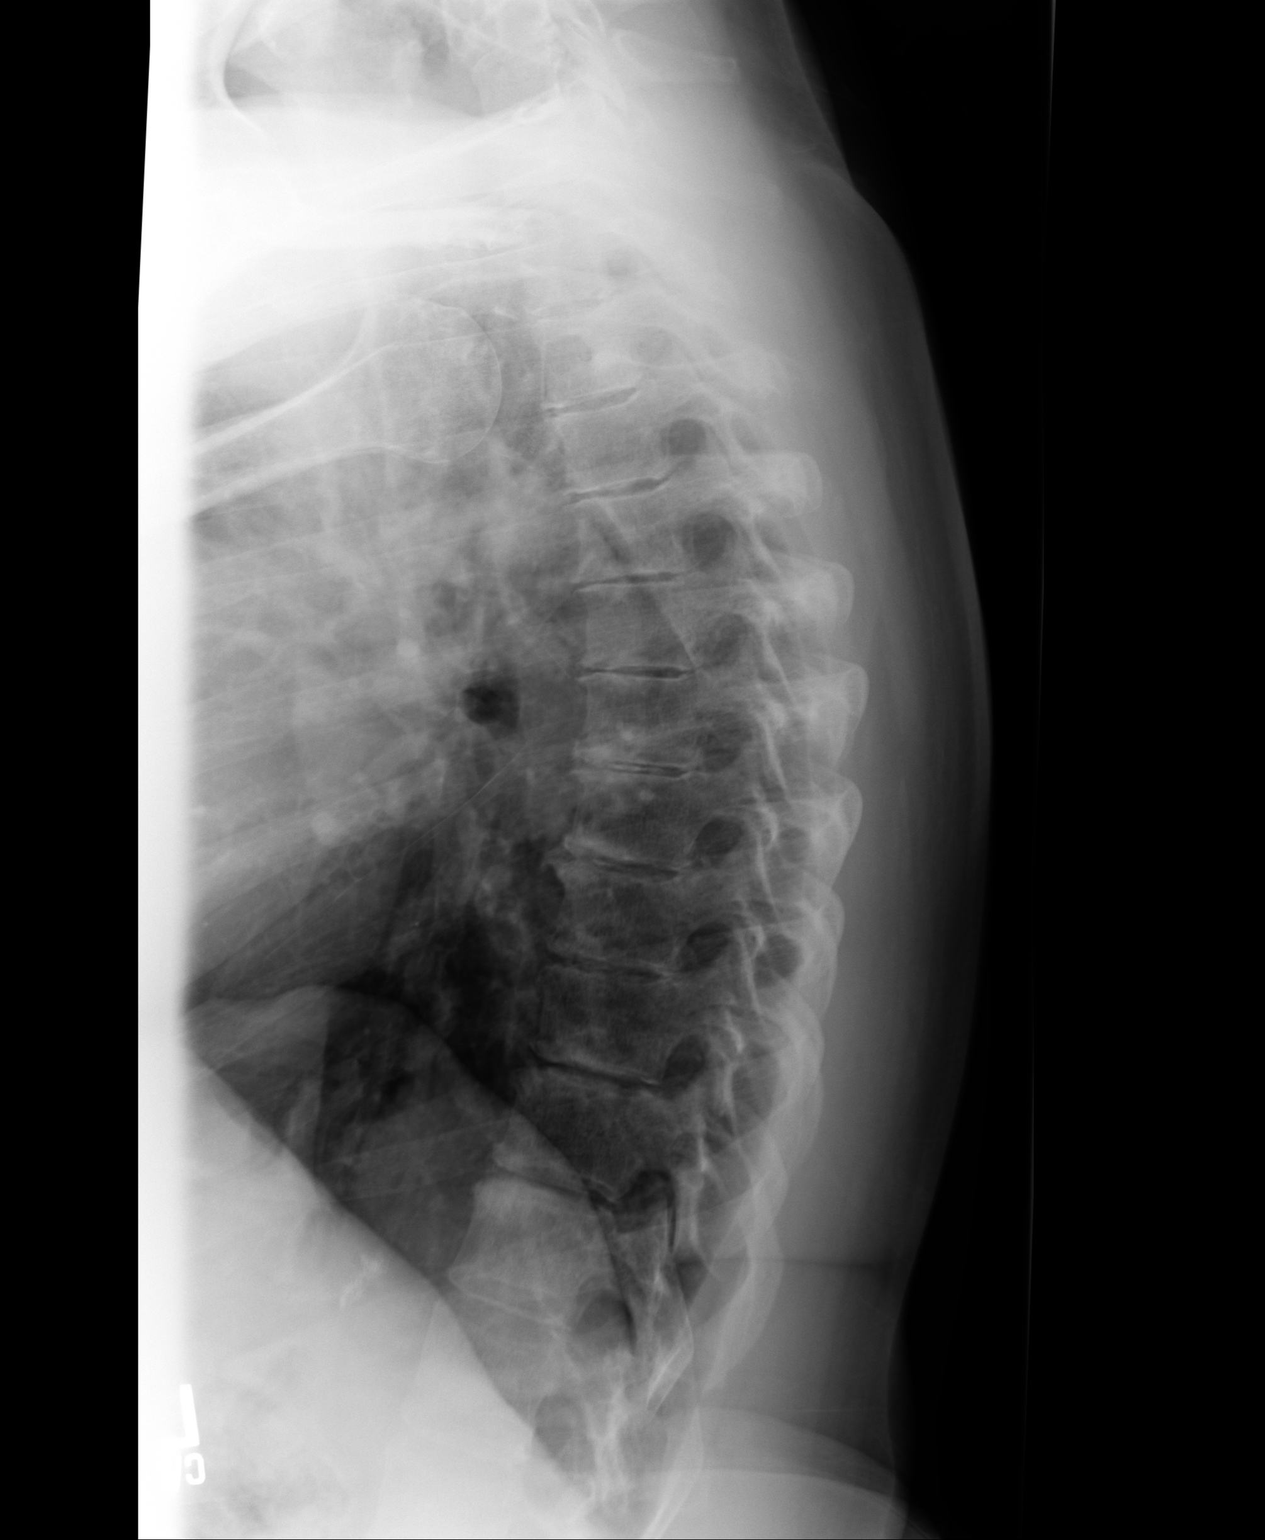

[2 of 2 positions shown; findings below may reference images not displayed]

PROCEDURE:     KDR - KDXR THORACIC AP AND LATERAL  - July 23, 2011 [DATE]

RESULT:     The thoracic vertebral bodies are preserved in height. Mild disc
space narrowing is noted at multiple levels consistent with osteoarthritis.
There is gentle curvature of the mid to upper thoracic spine with the
convexity toward the left. I see no abnormal paravertebral soft tissue
densities.
IMPRESSION: There are mild degenerative changes involving the thoracic
discs at multiple levels.

## 2013-07-27 IMAGING — CR CERVICAL SPINE - 2-3 VIEW
1 series · 4 of 4 positions shown · non-contrast
Comparison: none

REASON FOR EXAM: back pain, pain in limb
COMMENTS:

[Series 1: view not recorded · 0.17mm/px · 4 of 4 slices shown]
[im 1/4]
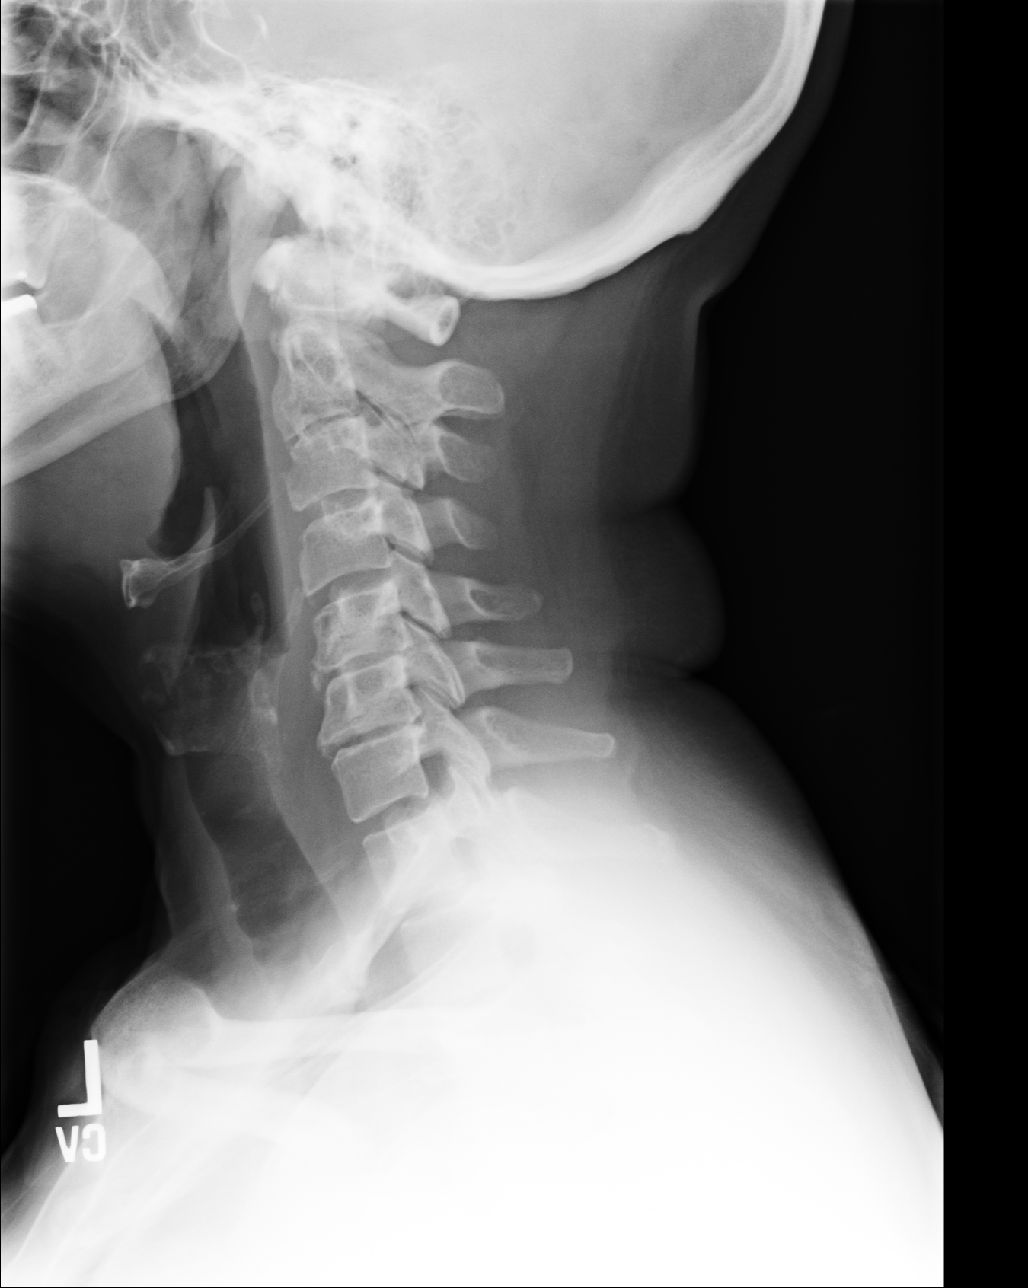
[im 2/4]
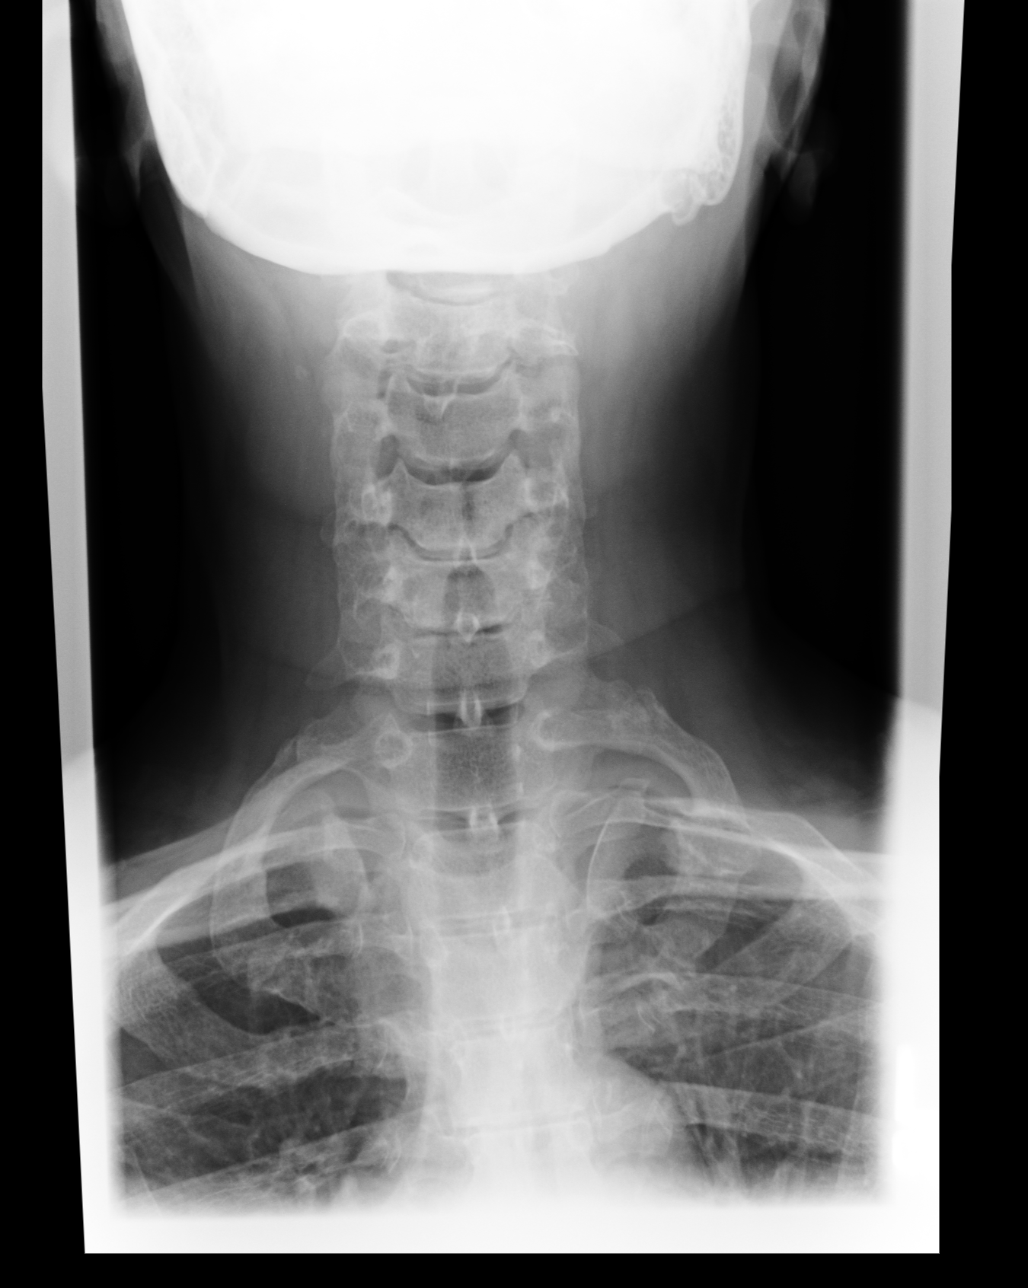
[im 3/4]
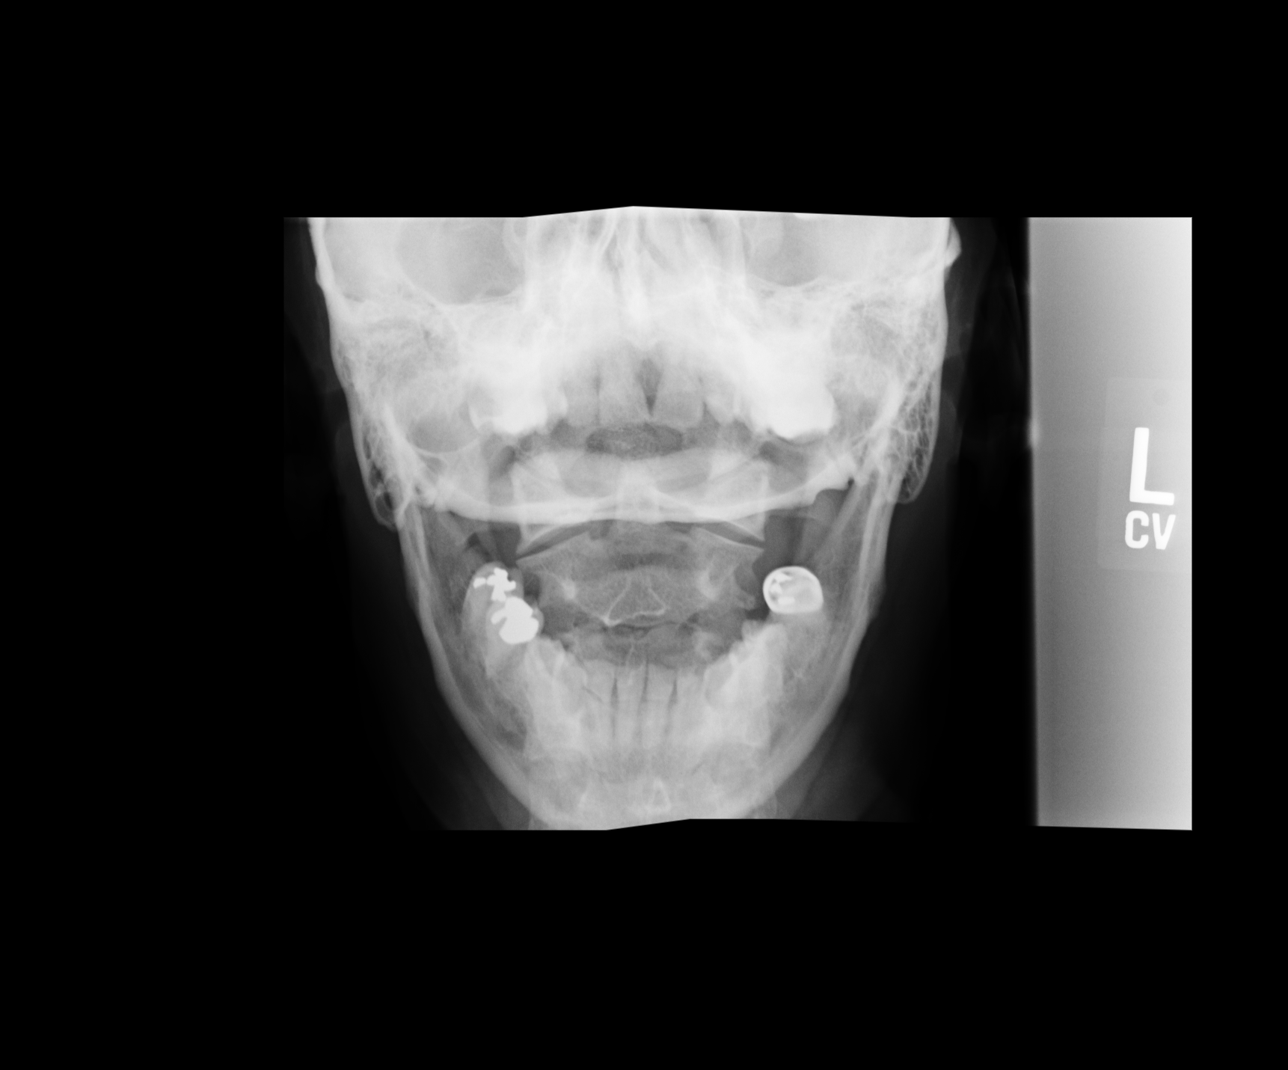
[im 4/4]
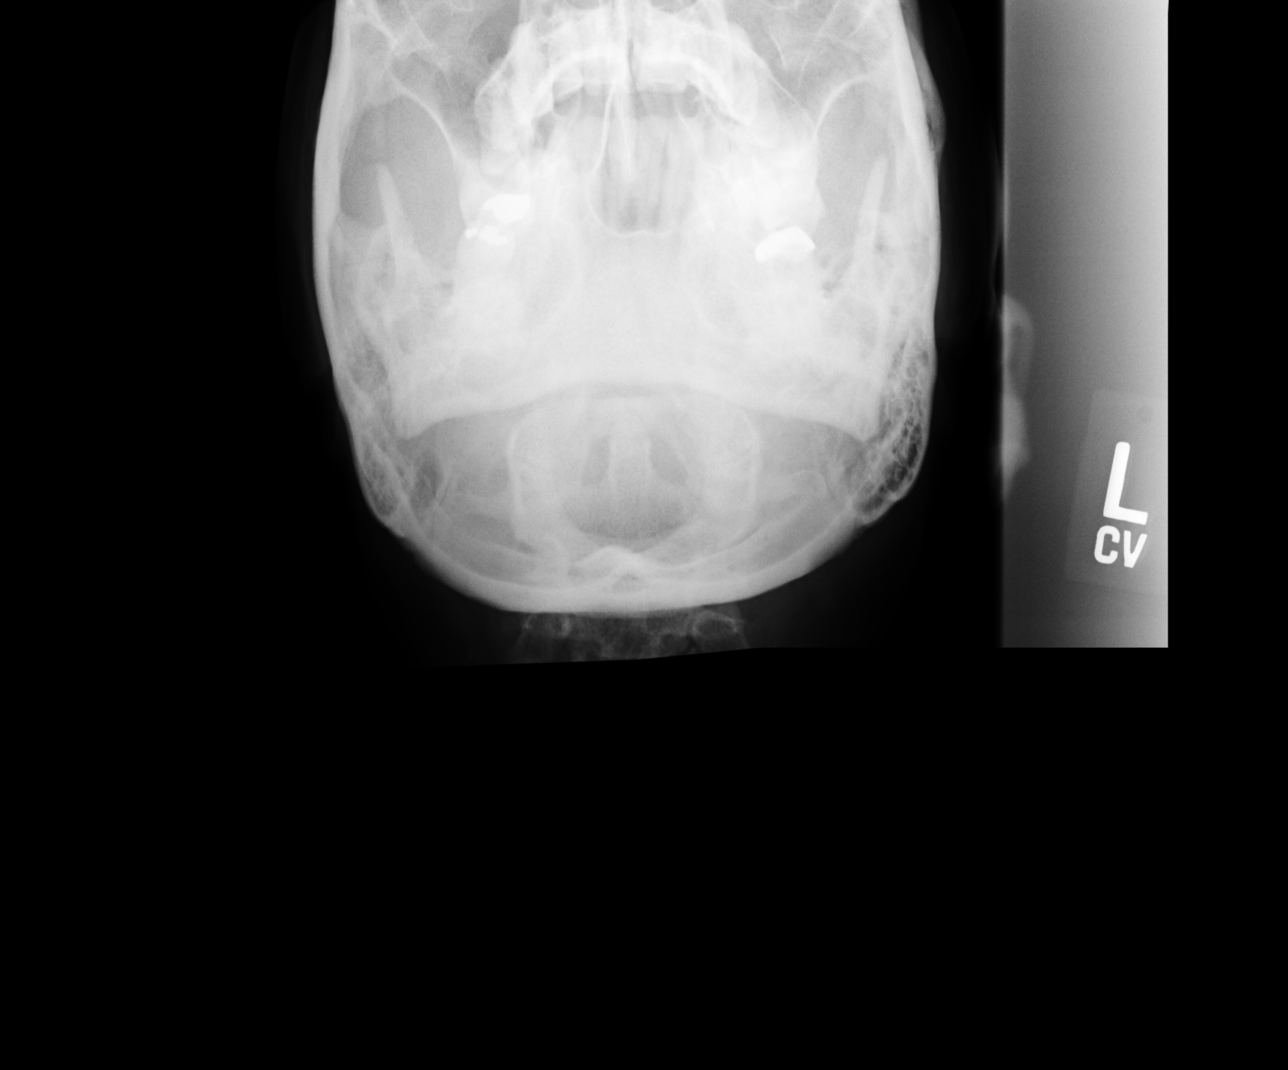

[4 of 4 positions shown; findings below may reference images not displayed]

PROCEDURE:     KDR - KDXR C-SPINE AP AND LATERAL  - July 23, 2011 [DATE]

RESULT:     AP, lateral, and odontoid views of the cervical spine are
submitted. The cervical vertebral bodies are preserved in height. There is
disc space narrowing at C2-C3 and at C5-C6 and at C6-C7 consistent with
osteoarthritis. The prevertebral soft tissue spaces appear normal. The
spinous processes are intact. The lateral masses of C1 align normally with
those of C2. The odontoid is intact.
IMPRESSION: 1. There are degenerative changes at C2-C3 and at C5-C6 and at C6-C7
consistent with osteoarthritis.
3. Old deformity that may be congenital is seen associated with the left
first rib.

## 2013-08-23 IMAGING — MR MR [PERSON_NAME]*[PERSON_NAME]* W/O CM
5 of 6 series · 29 of 40 positions shown · non-contrast
Comparison: none

REASON FOR EXAM: pain swelling
COMMENTS:

PROCEDURE:     MR  - MR ABISHE RIGHT  WO CONTRAST  - August 19, 2011  [DATE]
RESULT:     Comparison: None
INDICATION: Pain, swelling
TECHNIQUE: Multiplanar and multisequence MRI of the right hand without
contrast

[Series 3: T1 · axial · 7.0mm · 0.41mm/px · z∈[-151,+100]mm · 8 of 24 slices shown (1 of 2)]
[im 1/24]
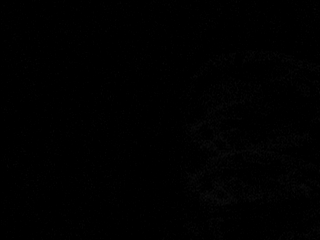
[im 4/24]
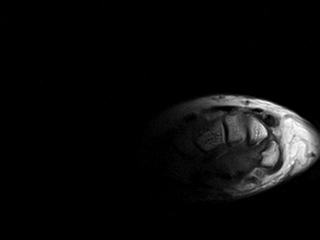
[im 7/24]
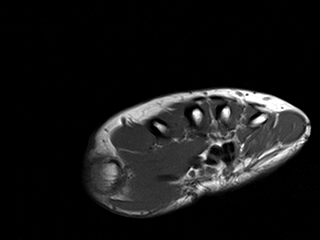
[im 10/24]
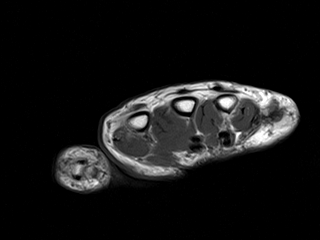
[im 14/24]
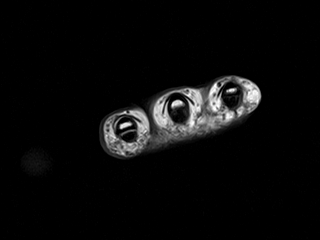
[im 17/24]
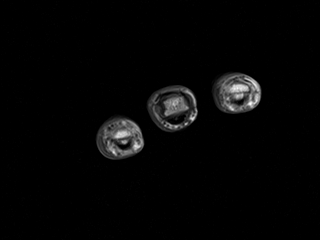
[im 20/24]
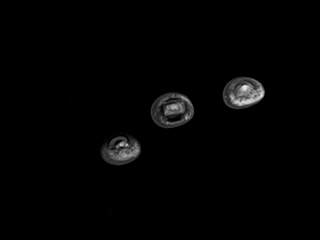
[im 24/24]
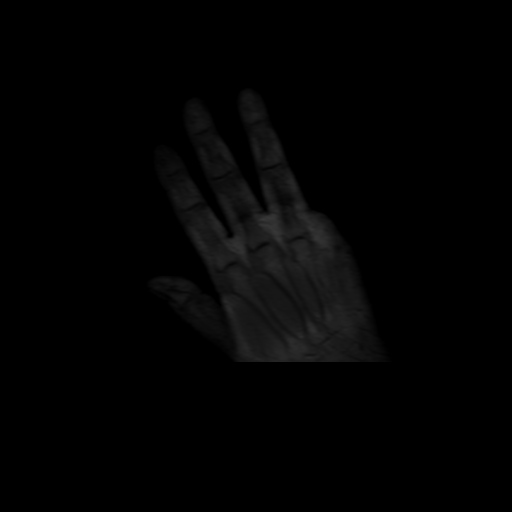

[Series 6: T1 · sagittal · 4.0mm · 0.35mm/px · 1 of 22 slices shown (2 of 2)]
[im 1/22]
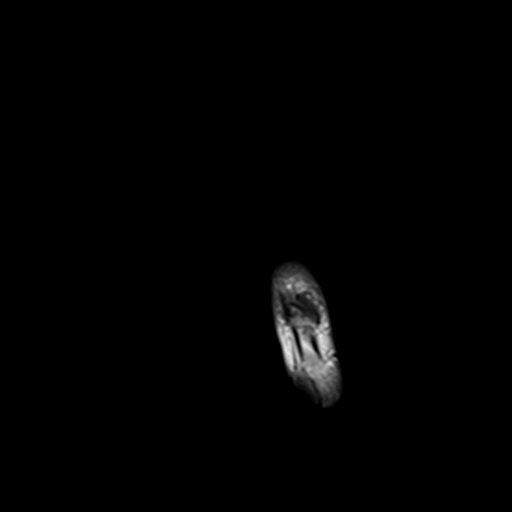

[Series 8: T2 fat-sat · coronal · 4.0mm · 0.35mm/px · 5 of 14 slices shown (1 of 3)]
[im 1/14]
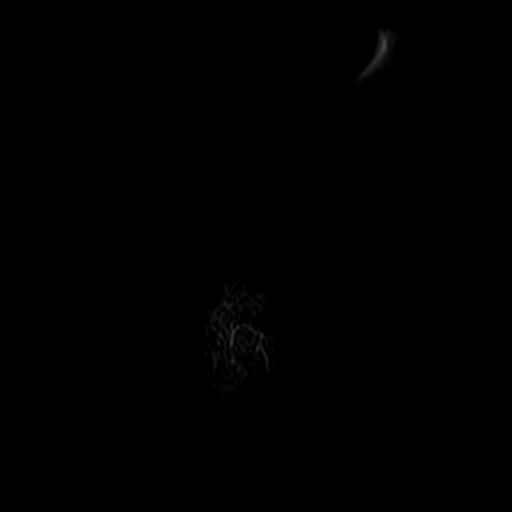
[im 4/14]
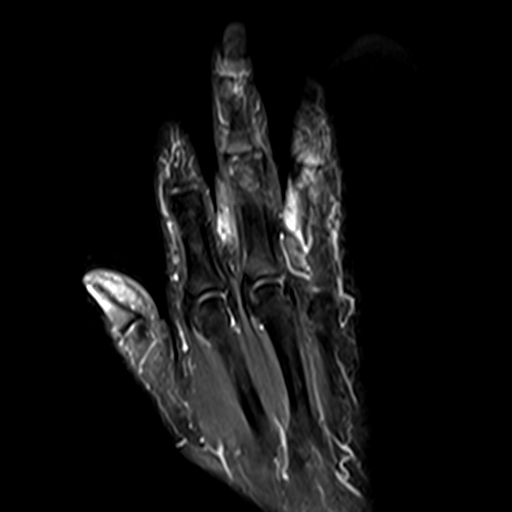
[im 7/14]
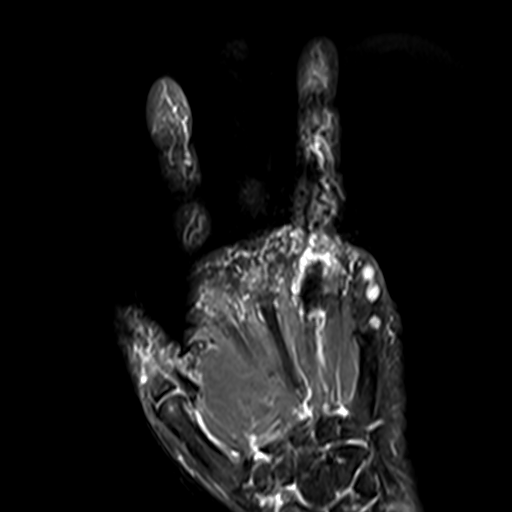
[im 10/14]
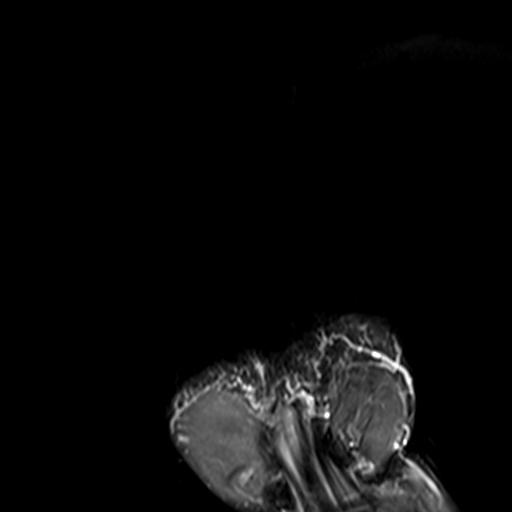
[im 14/14]
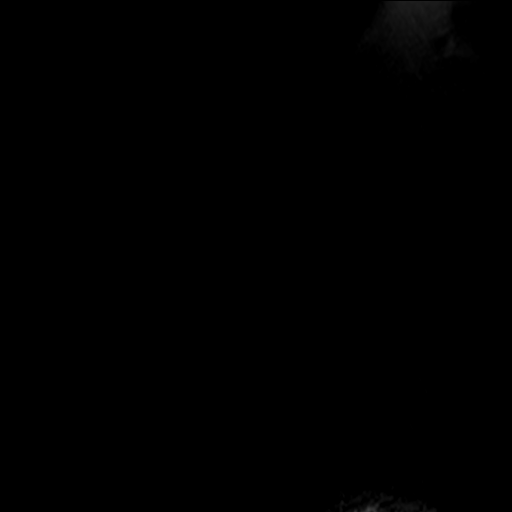

[Series 9: T2 fat-sat · axial · 7.0mm · 0.51mm/px · z∈[-144,+32]mm · 8 of 23 slices shown (2 of 3)]
[im 1/23]
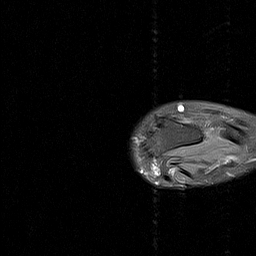
[im 4/23]
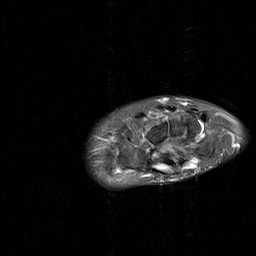
[im 7/23]
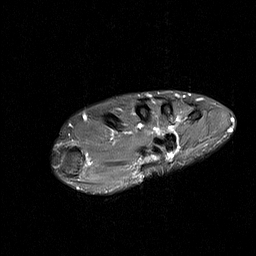
[im 10/23]
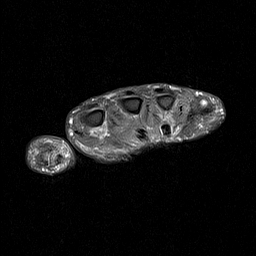
[im 13/23]
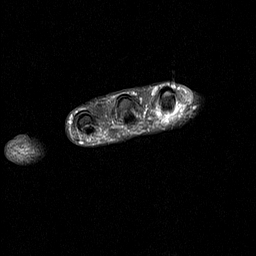
[im 16/23]
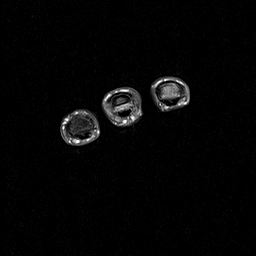
[im 19/23]
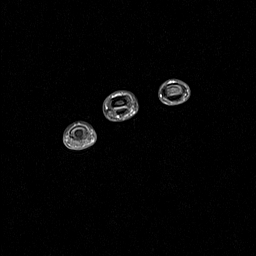
[im 23/23]
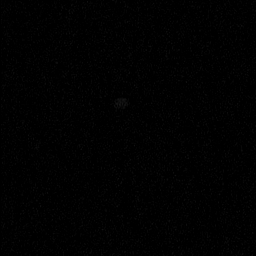

[Series 10: T2 fat-sat · sagittal · 4.0mm · 0.31mm/px · 7 of 21 slices shown (3 of 3)]
[im 1/21]
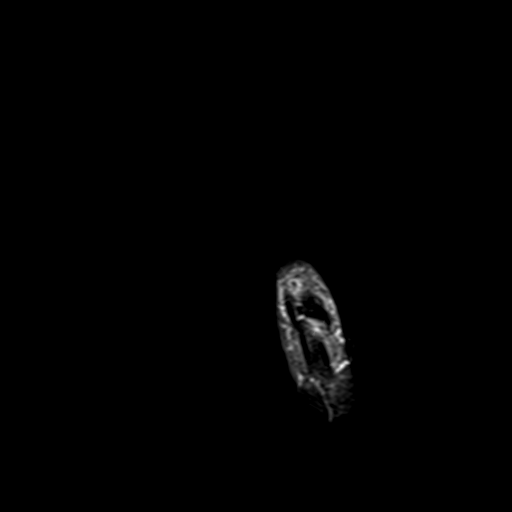
[im 4/21]
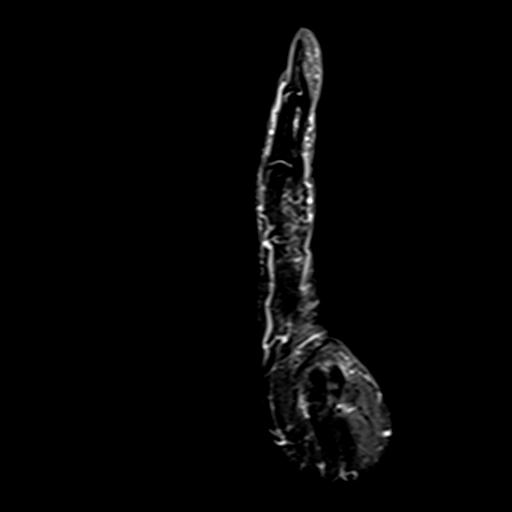
[im 7/21]
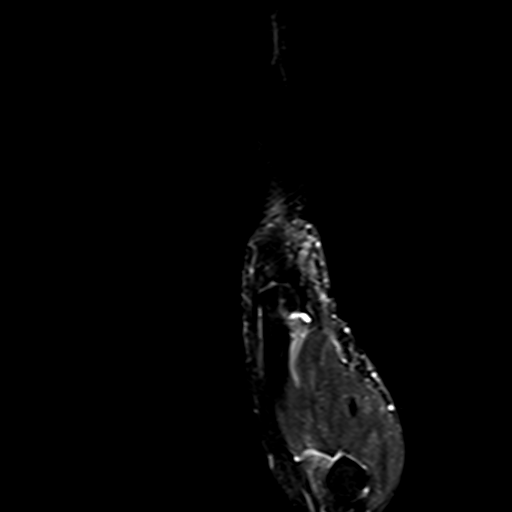
[im 11/21]
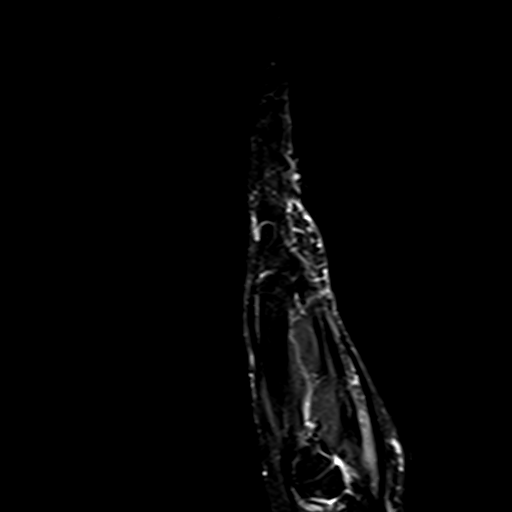
[im 14/21]
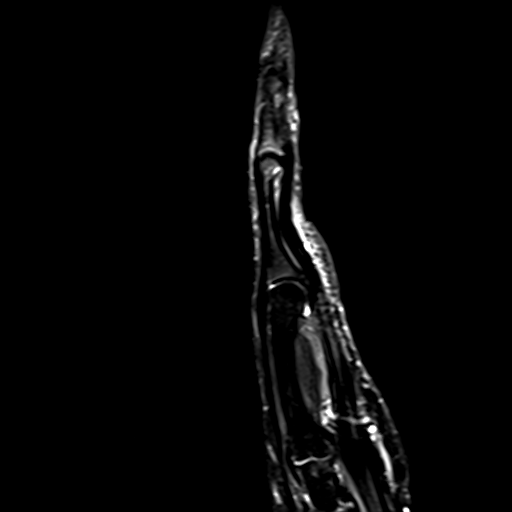
[im 17/21]
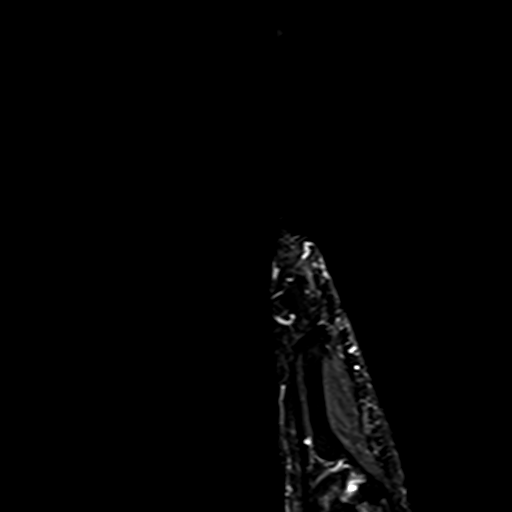
[im 21/21]
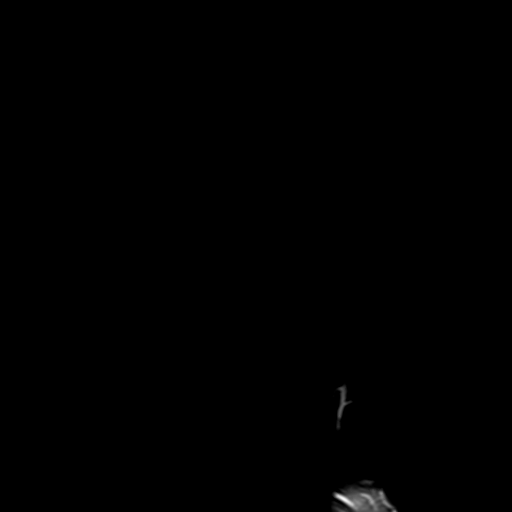

[29 of 40 positions shown; findings below may reference images not displayed]

FINDINGS: There is marrow edema within the fourth proximal, middle and distal phalanx
with surrounding soft tissue edema. The abnormality is isolated to the
fourth digit. The remainder of the visualized osseous structures are normal
in signal. There is evidence of prior right fifth digit amputation. The base
of the indications side there are 3 rounded T1 intermediate signal
structures measuring approximately 5 mm each likely representing small
neuromas.

There is no focal fluid collection. Is no hematoma.
IMPRESSION: 1. Nonspecific marrow edema within the fourth proximal, middle and distal
phalanx with surrounding soft tissue edema. The appearance may be secondary
to infection versus an inflammatory process.

## 2013-09-15 ENCOUNTER — Encounter (INDEPENDENT_AMBULATORY_CARE_PROVIDER_SITE_OTHER): Payer: Self-pay

## 2013-09-24 ENCOUNTER — Emergency Department: Payer: Self-pay | Admitting: Emergency Medicine

## 2013-11-11 ENCOUNTER — Encounter: Payer: Self-pay | Admitting: *Deleted

## 2013-11-16 ENCOUNTER — Encounter: Payer: Self-pay | Admitting: Podiatry

## 2013-11-16 ENCOUNTER — Ambulatory Visit (INDEPENDENT_AMBULATORY_CARE_PROVIDER_SITE_OTHER): Payer: Medicare PPO

## 2013-11-16 ENCOUNTER — Ambulatory Visit (INDEPENDENT_AMBULATORY_CARE_PROVIDER_SITE_OTHER): Payer: Medicare PPO | Admitting: Podiatry

## 2013-11-16 VITALS — BP 127/64 | HR 78 | Resp 16 | Ht 63.0 in | Wt 153.0 lb

## 2013-11-16 DIAGNOSIS — M79609 Pain in unspecified limb: Secondary | ICD-10-CM

## 2013-11-16 DIAGNOSIS — M7750 Other enthesopathy of unspecified foot: Secondary | ICD-10-CM

## 2013-11-16 DIAGNOSIS — M7751 Other enthesopathy of right foot: Secondary | ICD-10-CM

## 2013-11-16 DIAGNOSIS — M79671 Pain in right foot: Secondary | ICD-10-CM

## 2013-11-16 NOTE — Progress Notes (Signed)
N PAIN L 5TH DIGIT RIGHT D 3 M O SLOWLY  C WORSE A CLOSED IN SHOES, SOCKS T 0

## 2013-11-16 NOTE — Progress Notes (Signed)
Julie Acevedo is known to [place] if she presents today with a chief complaint of painful fifth digit of the right foot. States this been going on now for approximately 3 months and slowly getting worse particularly with shoe gear.  Objective: Vital signs are stable she is alert and oriented x3. Strong palpable pulses to the right lower extremity. She has a painful fifth digit of the right foot with an overlying reactive hyperkeratosis PIPJ fifth right.  Assessment: Capsulitis overlying bursitis and reactive hyperkeratosis fifth digit of the right foot.  Plan: Injected dexamethasone and local anesthetic today and debrided all reactive hyperkeratosis fifth digit right foot.

## 2014-01-17 ENCOUNTER — Inpatient Hospital Stay: Payer: Self-pay | Admitting: Specialist

## 2014-01-17 LAB — PROTIME-INR
INR: 0.9
Prothrombin Time: 11.9 secs (ref 11.5–14.7)

## 2014-01-17 LAB — MAGNESIUM: Magnesium: 2 mg/dL

## 2014-01-17 LAB — COMPREHENSIVE METABOLIC PANEL
Albumin: 3.5 g/dL (ref 3.4–5.0)
Alkaline Phosphatase: 116 U/L
Anion Gap: 9 (ref 7–16)
BUN: 15 mg/dL (ref 7–18)
Bilirubin,Total: 0.1 mg/dL — ABNORMAL LOW (ref 0.2–1.0)
Calcium, Total: 9 mg/dL (ref 8.5–10.1)
Chloride: 110 mmol/L — ABNORMAL HIGH (ref 98–107)
Co2: 23 mmol/L (ref 21–32)
Creatinine: 0.89 mg/dL (ref 0.60–1.30)
EGFR (African American): >60
EGFR (Non-African Amer.): >60
Glucose: 114 mg/dL — ABNORMAL HIGH (ref 65–99)
Osmolality: 285 (ref 275–301)
Potassium: 2.6 mmol/L — ABNORMAL LOW (ref 3.5–5.1)
SGOT(AST): 20 U/L (ref 15–37)
SGPT (ALT): 21 U/L (ref 12–78)
Sodium: 142 mmol/L (ref 136–145)
Total Protein: 7.5 g/dL (ref 6.4–8.2)

## 2014-01-17 LAB — URINALYSIS, COMPLETE
Bacteria: NONE SEEN
Bilirubin,UR: NEGATIVE
Blood: NEGATIVE
Glucose,UR: NEGATIVE mg/dL (ref 0–75)
Ketone: NEGATIVE
Leukocyte Esterase: NEGATIVE
Nitrite: NEGATIVE
Ph: 6 (ref 4.5–8.0)
Protein: NEGATIVE
RBC,UR: 7 /HPF (ref 0–5)
Specific Gravity: 1.017 (ref 1.003–1.030)
Squamous Epithelial: <1
WBC UR: <1 /HPF (ref 0–5)

## 2014-01-17 LAB — CBC
HCT: 36.7 % (ref 35.0–47.0)
HGB: 12 g/dL (ref 12.0–16.0)
MCH: 27.7 pg (ref 26.0–34.0)
MCHC: 32.8 g/dL (ref 32.0–36.0)
MCV: 85 fL (ref 80–100)
Platelet: 349 10*3/uL (ref 150–440)
RBC: 4.34 10*6/uL (ref 3.80–5.20)
RDW: 14.5 % (ref 11.5–14.5)
WBC: 11.8 10*3/uL — ABNORMAL HIGH (ref 3.6–11.0)

## 2014-01-17 LAB — TROPONIN I: Troponin-I: <0.02 ng/mL

## 2014-01-17 LAB — DRUG SCREEN, URINE

## 2014-01-17 LAB — ETHANOL
Ethanol %: <0.003 % (ref 0.000–0.080)
Ethanol: <3 mg/dL

## 2014-01-18 LAB — BASIC METABOLIC PANEL
Anion Gap: 3 — ABNORMAL LOW (ref 7–16)
BUN: 11 mg/dL (ref 7–18)
Calcium, Total: 9 mg/dL (ref 8.5–10.1)
Chloride: 113 mmol/L — ABNORMAL HIGH (ref 98–107)
Co2: 25 mmol/L (ref 21–32)
Creatinine: 0.78 mg/dL (ref 0.60–1.30)
EGFR (African American): >60
EGFR (Non-African Amer.): >60
Glucose: 88 mg/dL (ref 65–99)
Osmolality: 280 (ref 275–301)
Potassium: 4 mmol/L (ref 3.5–5.1)
Sodium: 141 mmol/L (ref 136–145)

## 2014-01-18 LAB — LIPID PANEL
Cholesterol: 154 mg/dL (ref 0–200)
HDL Cholesterol: 74 mg/dL — ABNORMAL HIGH (ref 40–60)
Ldl Cholesterol, Calc: 69 mg/dL (ref 0–100)
Triglycerides: 53 mg/dL (ref 0–200)
VLDL Cholesterol, Calc: 11 mg/dL (ref 5–40)

## 2014-01-18 LAB — HEMOGLOBIN A1C: Hemoglobin A1C: 6.3 % (ref 4.2–6.3)

## 2014-01-18 LAB — TSH: Thyroid Stimulating Horm: 2.68 u[IU]/mL

## 2014-01-21 LAB — BASIC METABOLIC PANEL
BUN: 10 mg/dL (ref 4–21)
Creatinine: 0.9 mg/dL (ref <=1.1)
Glucose: 81 mg/dL
Potassium: 3.8 mmol/L (ref 3.4–5.3)
Sodium: 142 mmol/L (ref 137–147)

## 2014-01-31 IMAGING — CR DG CHEST 1V PORT
1 series · 1 of 1 positions shown · non-contrast
Comparison: 02/06/2010

CLINICAL DATA: Chest pain and shortness of breath

PORTABLE CHEST - 1 VIEW

[AP]
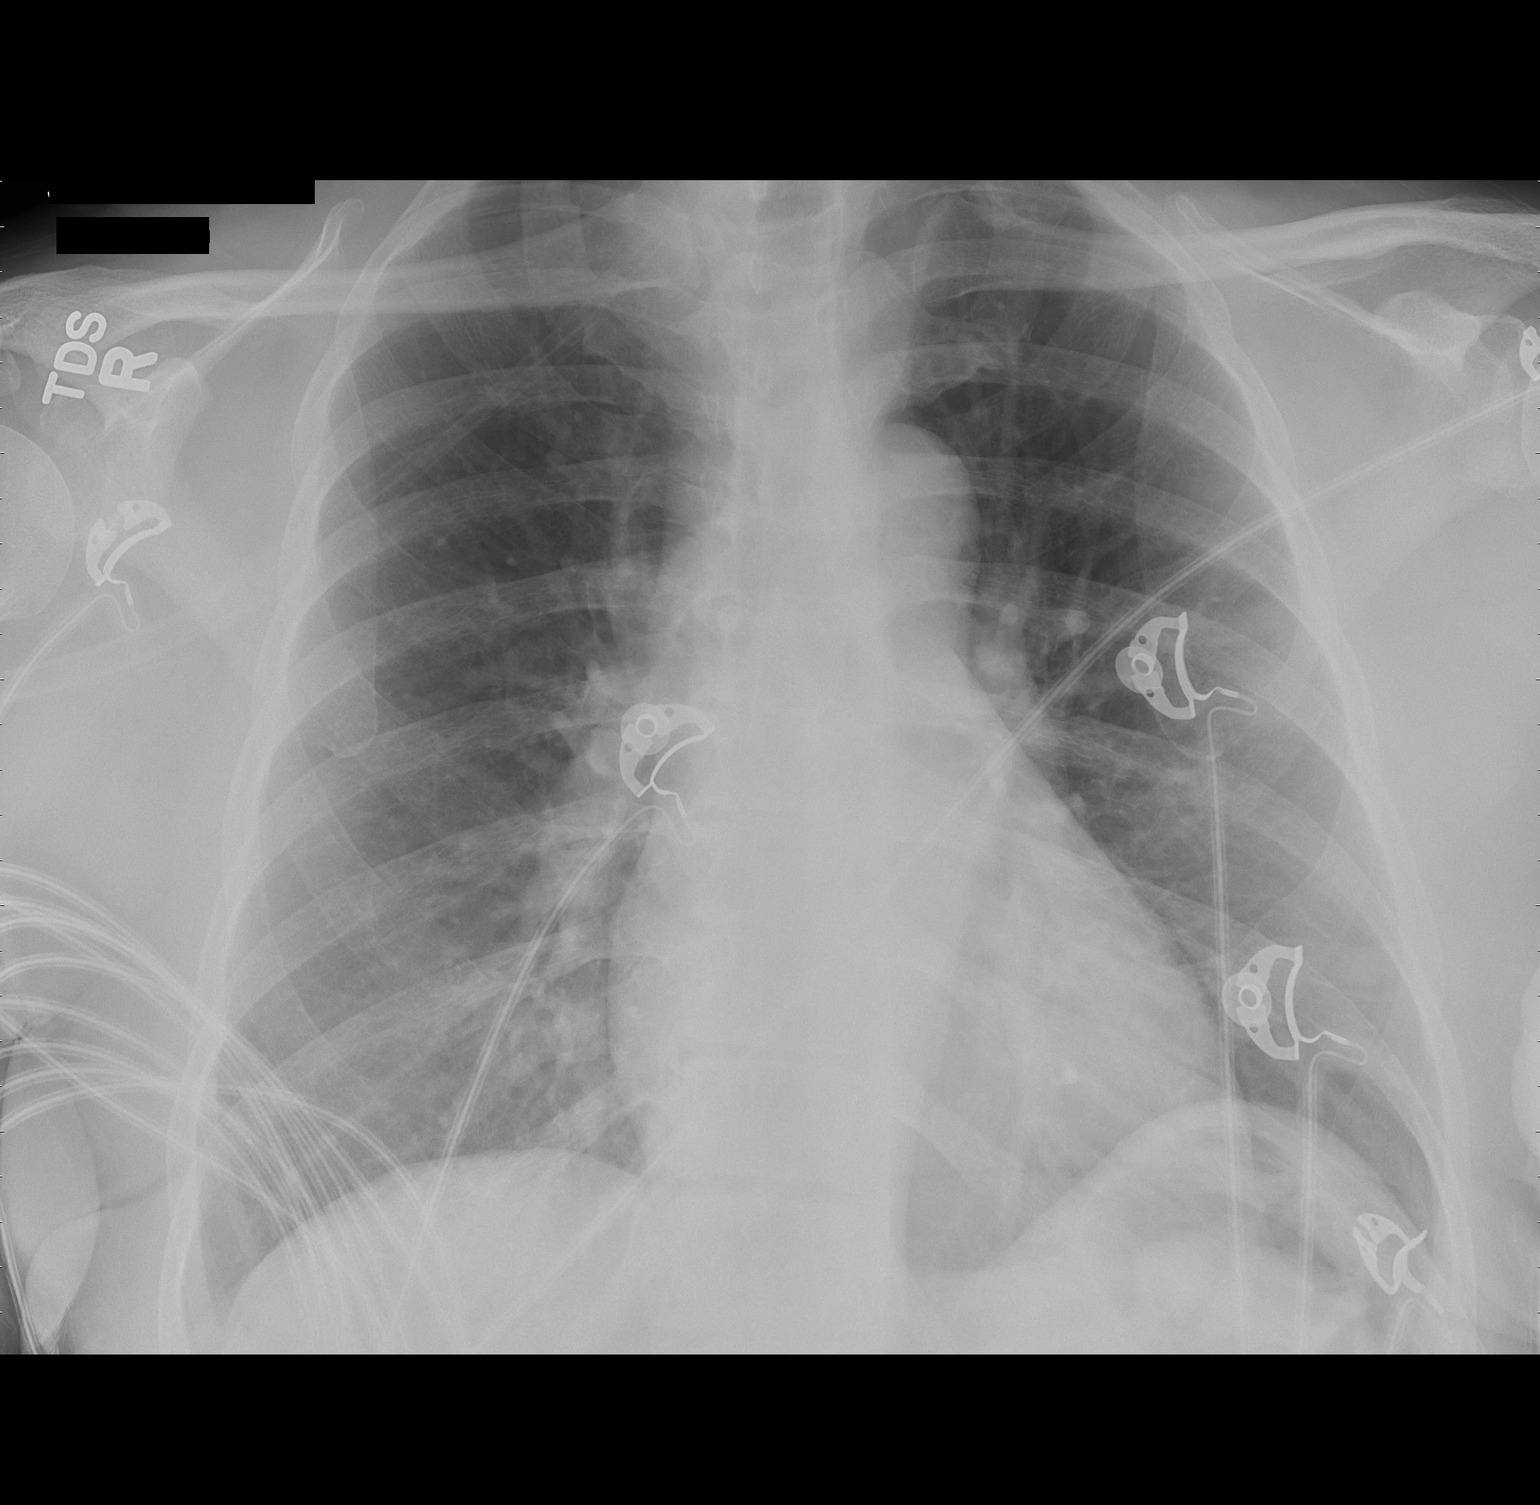

[1 of 1 positions shown; findings below may reference images not displayed]

FINDINGS: Shallow inspiration with elevation of left hemidiaphragm.
Normal heart size and pulmonary vascularity.  No focal airspace
consolidation in the lungs.  No blunting of costophrenic angles.
No pneumothorax.  Old healed left upper rib fractures.  Stable
appearance since previous study.
IMPRESSION: No evidence of active pulmonary disease.

## 2014-02-17 ENCOUNTER — Emergency Department: Payer: Self-pay | Admitting: Emergency Medicine

## 2014-02-17 LAB — URINALYSIS, COMPLETE
Bacteria: NONE SEEN
Bilirubin,UR: NEGATIVE
Blood: NEGATIVE
Glucose,UR: NEGATIVE mg/dL (ref 0–75)
Ketone: NEGATIVE
Leukocyte Esterase: NEGATIVE
Nitrite: NEGATIVE
Ph: 7 (ref 4.5–8.0)
Protein: NEGATIVE
RBC,UR: <1 /HPF (ref 0–5)
Specific Gravity: 1.016 (ref 1.003–1.030)
Squamous Epithelial: 2
WBC UR: <1 /HPF (ref 0–5)

## 2014-02-17 LAB — BASIC METABOLIC PANEL
Anion Gap: 5 — ABNORMAL LOW (ref 7–16)
BUN: 13 mg/dL (ref 7–18)
Calcium, Total: 9.4 mg/dL (ref 8.5–10.1)
Chloride: 106 mmol/L (ref 98–107)
Co2: 29 mmol/L (ref 21–32)
Creatinine: 0.87 mg/dL (ref 0.60–1.30)
EGFR (African American): >60
EGFR (Non-African Amer.): >60
Glucose: 95 mg/dL (ref 65–99)
Osmolality: 279 (ref 275–301)
Potassium: 2.9 mmol/L — ABNORMAL LOW (ref 3.5–5.1)
Sodium: 140 mmol/L (ref 136–145)

## 2014-02-17 LAB — CBC WITH DIFFERENTIAL/PLATELET
Basophil #: 0 10*3/uL (ref 0.0–0.1)
Basophil %: 0.2 %
Eosinophil #: 0.1 10*3/uL (ref 0.0–0.7)
Eosinophil %: 0.8 %
HCT: 37.4 % (ref 35.0–47.0)
HGB: 12.3 g/dL (ref 12.0–16.0)
Lymphocyte #: 3.6 10*3/uL (ref 1.0–3.6)
Lymphocyte %: 39.6 %
MCH: 27.4 pg (ref 26.0–34.0)
MCHC: 32.8 g/dL (ref 32.0–36.0)
MCV: 84 fL (ref 80–100)
Monocyte #: 0.5 x10 3/mm (ref 0.2–0.9)
Monocyte %: 5.2 %
Neutrophil #: 4.9 10*3/uL (ref 1.4–6.5)
Neutrophil %: 54.2 %
Platelet: 370 10*3/uL (ref 150–440)
RBC: 4.48 10*6/uL (ref 3.80–5.20)
RDW: 14.6 % — ABNORMAL HIGH (ref 11.5–14.5)
WBC: 9.1 10*3/uL (ref 3.6–11.0)

## 2014-02-17 LAB — TROPONIN I: Troponin-I: <0.02 ng/mL

## 2014-06-07 ENCOUNTER — Ambulatory Visit: Payer: Self-pay | Admitting: Family Medicine

## 2014-06-17 IMAGING — CR DG LUMBAR SPINE 2-3V
1 series · 3 of 3 positions shown · non-contrast
Comparison: none

REASON FOR EXAM: low back pain
COMMENTS:

PROCEDURE:     KDR - KDXR LUMBAR SPINE AP AND LATERAL  - June 12, 2012 [DATE]
RESULT:     Comparison: 02/08/2008

[Series 1: ap · 0.17mm/px · 3 of 3 slices shown]
[im 1/3]
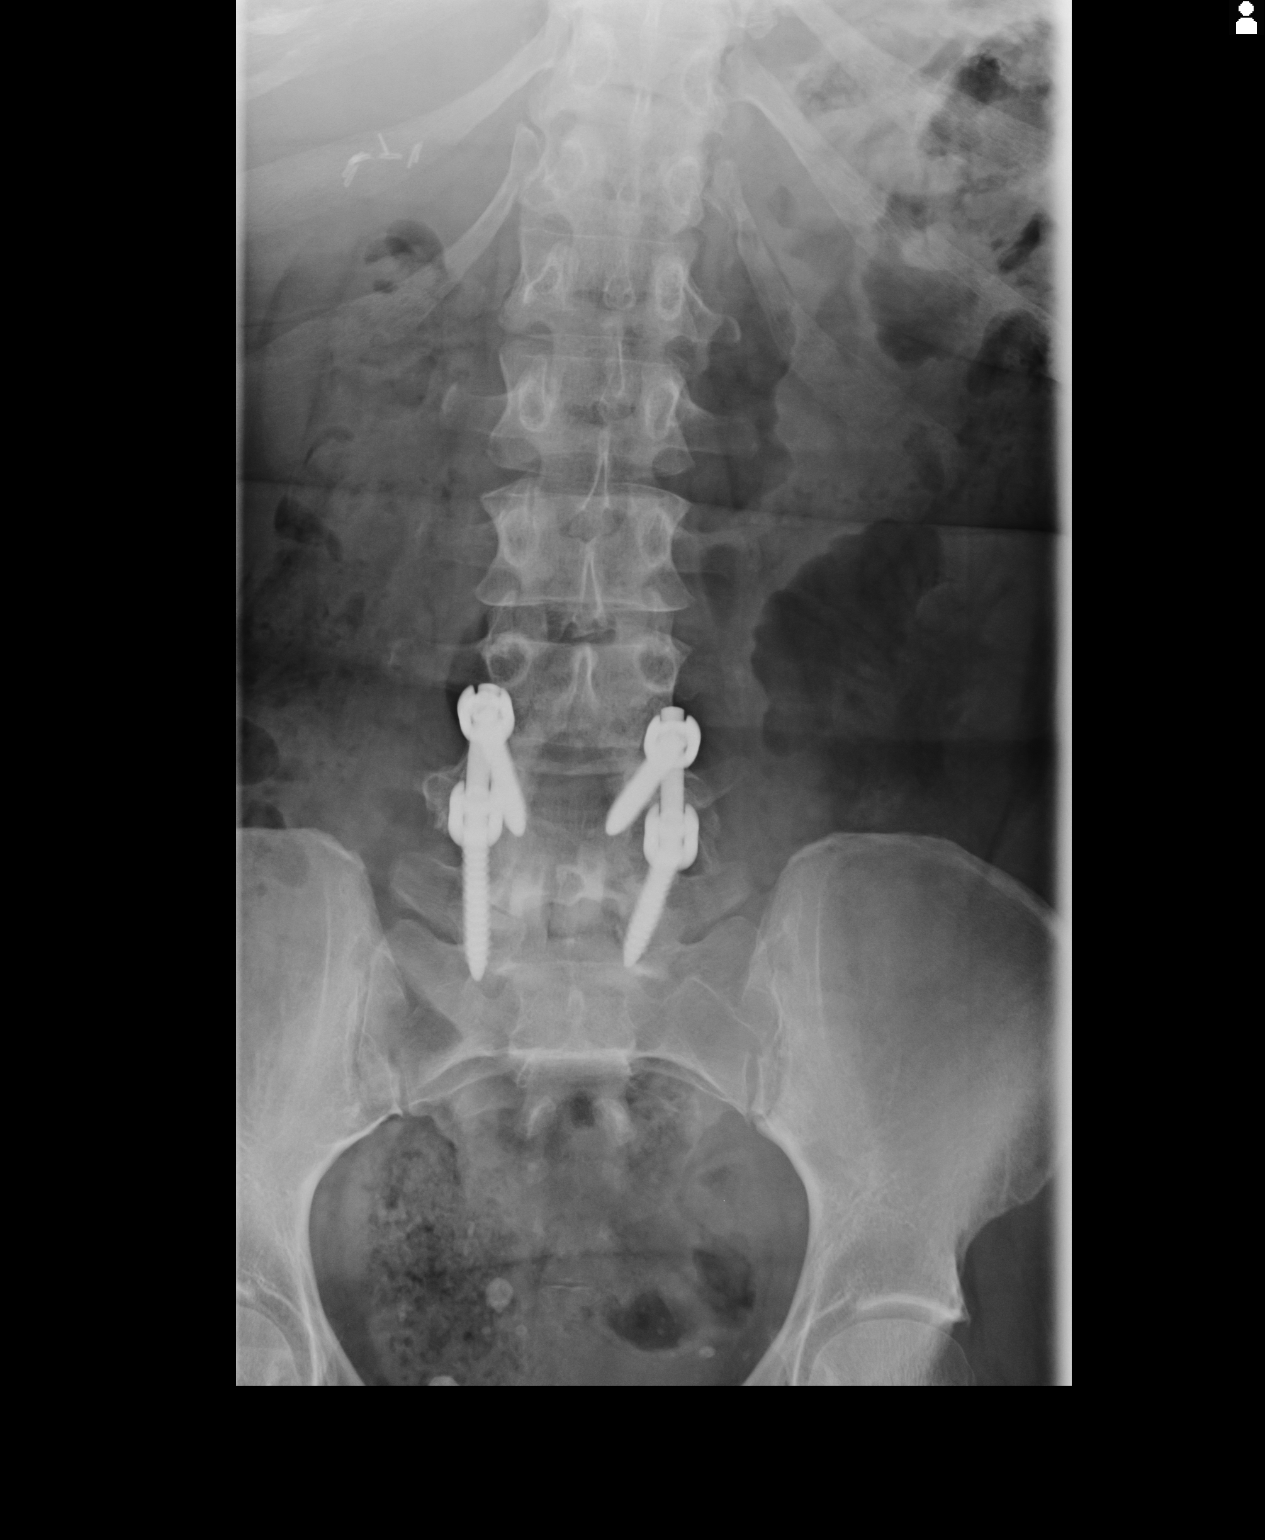
[im 2/3]
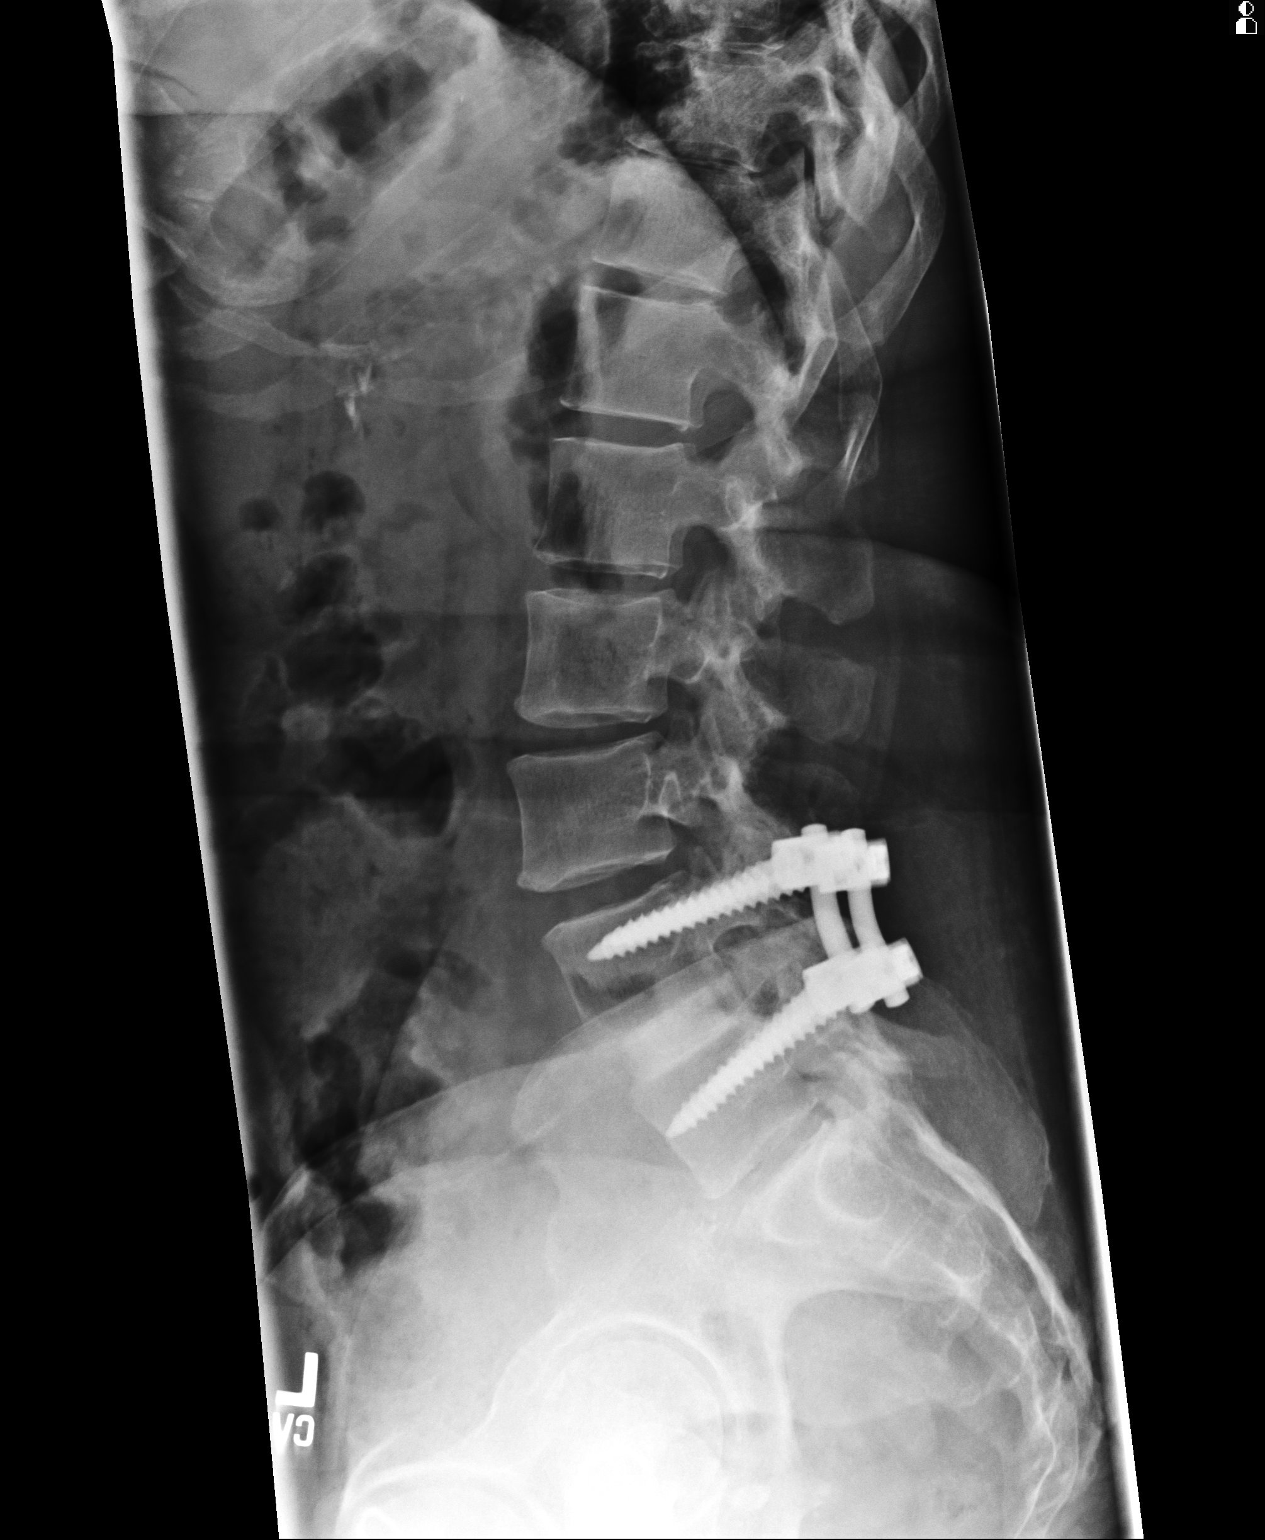
[im 3/3]
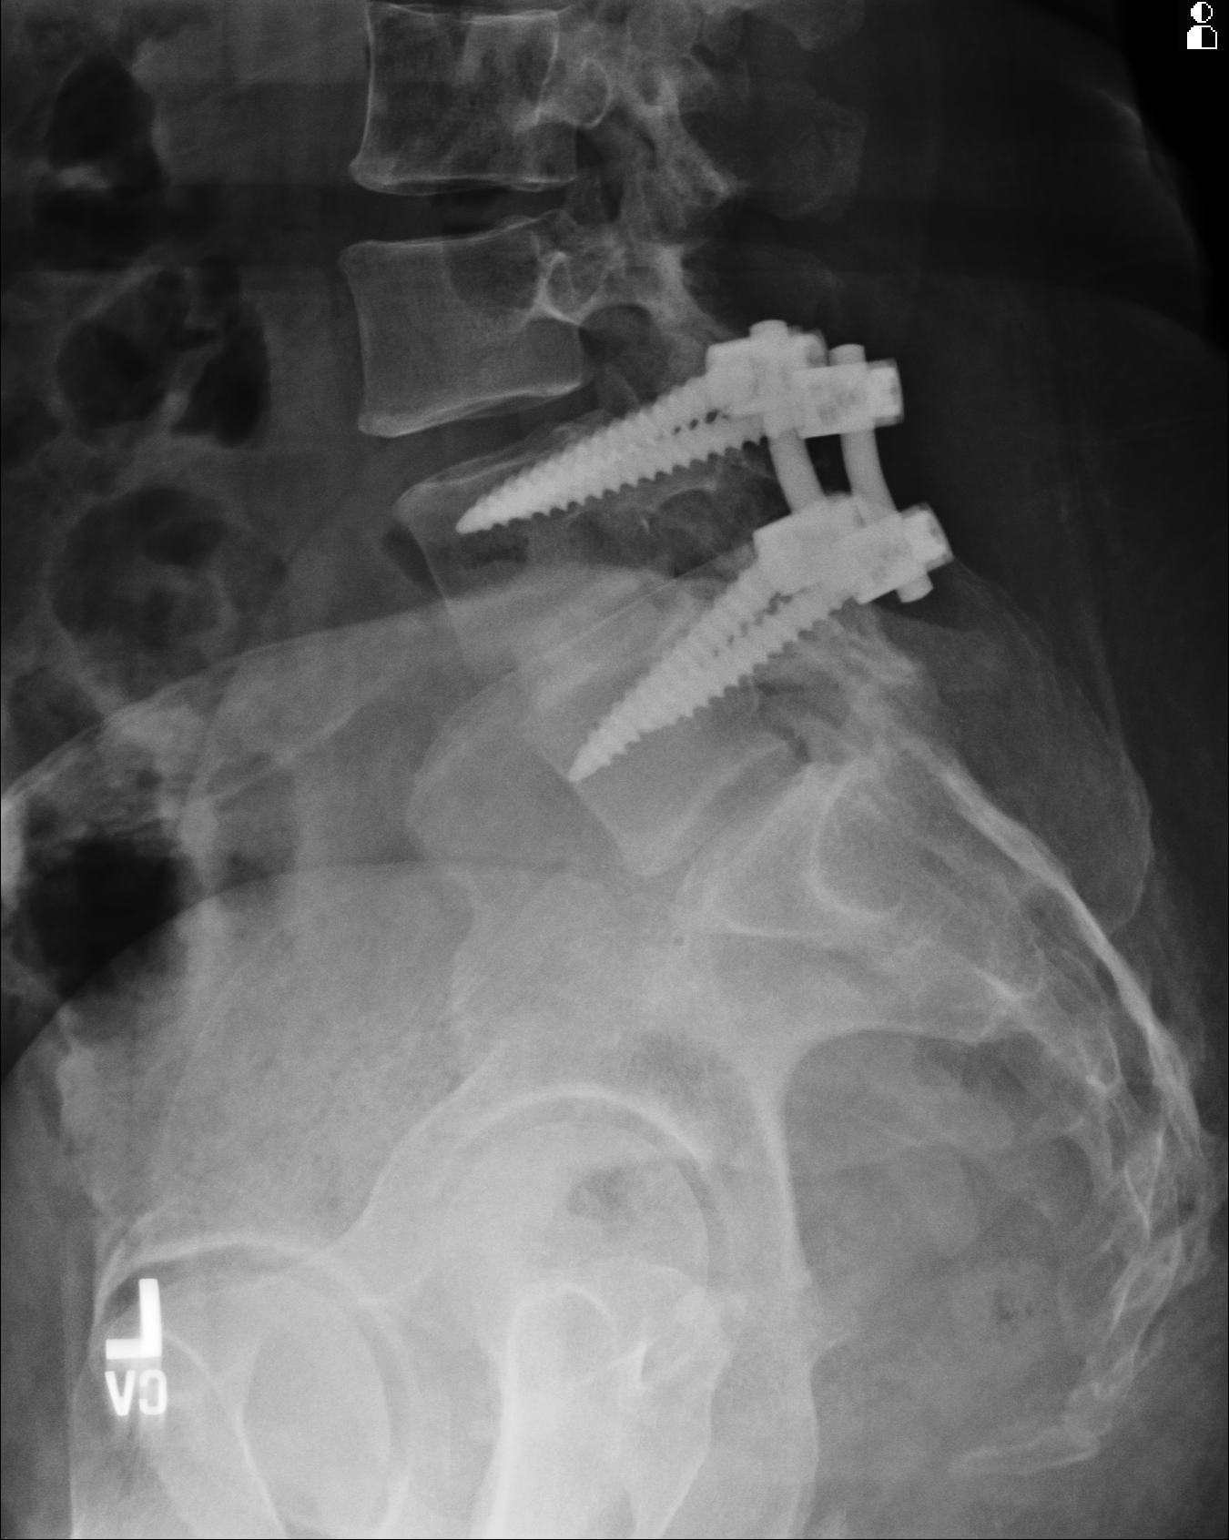

[3 of 3 positions shown; findings below may reference images not displayed]

FINDINGS: There are 6 lumbar type vertebral bodies. Bilateral pedicle screws are seen
at L5 and L6. Intervertebral disc cage seen at L5-L6. No evidence of
hardware failure. Mild intervertebral disc loss of L6-S1 is similar to
prior. Vertebral body heights are relatively preserved. There is normal
alignment.
IMPRESSION: 1. Mild degenerative disc disease at L6-S1, similar to prior.
2. Posterior spinal fusion hardware at L5-L6.

## 2014-07-15 ENCOUNTER — Encounter: Payer: Self-pay | Admitting: Specialist

## 2014-07-17 ENCOUNTER — Encounter: Payer: Self-pay | Admitting: Specialist

## 2014-08-08 ENCOUNTER — Emergency Department: Payer: Self-pay | Admitting: Student

## 2014-09-06 LAB — HEMOGLOBIN A1C: Hgb A1c MFr Bld: 5.8 % (ref 4.0–6.0)

## 2014-09-09 ENCOUNTER — Ambulatory Visit (INDEPENDENT_AMBULATORY_CARE_PROVIDER_SITE_OTHER): Payer: Medicare PPO | Admitting: Podiatry

## 2014-09-09 ENCOUNTER — Ambulatory Visit (INDEPENDENT_AMBULATORY_CARE_PROVIDER_SITE_OTHER): Payer: Medicare PPO

## 2014-09-09 VITALS — BP 122/68 | HR 86 | Resp 16

## 2014-09-09 DIAGNOSIS — M76829 Posterior tibial tendinitis, unspecified leg: Secondary | ICD-10-CM

## 2014-09-09 DIAGNOSIS — M76821 Posterior tibial tendinitis, right leg: Secondary | ICD-10-CM

## 2014-09-09 DIAGNOSIS — M6740 Ganglion, unspecified site: Secondary | ICD-10-CM

## 2014-09-10 NOTE — Progress Notes (Signed)
She presents today chief complaint of a painful not to the medial aspect of her right foot that has become discolored.  Objective: Vital signs excellent oriented x3. She is no erythema edema saline is drainage or odor to the right foot. She has pain on palpation of the posterior tibial tendon and its insertion on the navicular bone. This navicular bone was quite prominent has resulted in rubbing in her tennis shoes and a reactive hyperpigmentation is noted.  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  Plan: Discussed etiology pathology conservative versus surgical therapies. I encouraged her to wear her [place] Rile but she has at home. And I injected the area with dexamethasone and local anesthetic. I encouraged her to ice this. Suggested to her that we may have to perform surgical excision of this nodule. If she is not improved by the next him I see her an MRI will be indicated.

## 2014-10-04 ENCOUNTER — Emergency Department (HOSPITAL_COMMUNITY): Payer: Medicare PPO

## 2014-10-04 ENCOUNTER — Emergency Department (HOSPITAL_COMMUNITY)
Admission: EM | Admit: 2014-10-04 | Discharge: 2014-10-04 | Disposition: A | Discharge status: Home / Self Care | Payer: Medicare PPO | Attending: Emergency Medicine | Admitting: Emergency Medicine

## 2014-10-04 ENCOUNTER — Encounter (HOSPITAL_COMMUNITY): Payer: Self-pay | Admitting: Emergency Medicine

## 2014-10-04 DIAGNOSIS — Z8719 Personal history of other diseases of the digestive system: Secondary | ICD-10-CM | POA: Insufficient documentation

## 2014-10-04 DIAGNOSIS — R531 Weakness: Secondary | ICD-10-CM | POA: Diagnosis present

## 2014-10-04 DIAGNOSIS — Z8739 Personal history of other diseases of the musculoskeletal system and connective tissue: Secondary | ICD-10-CM | POA: Diagnosis not present

## 2014-10-04 DIAGNOSIS — Z7982 Long term (current) use of aspirin: Secondary | ICD-10-CM | POA: Insufficient documentation

## 2014-10-04 DIAGNOSIS — I252 Old myocardial infarction: Secondary | ICD-10-CM | POA: Insufficient documentation

## 2014-10-04 DIAGNOSIS — R002 Palpitations: Secondary | ICD-10-CM | POA: Diagnosis not present

## 2014-10-04 DIAGNOSIS — I639 Cerebral infarction, unspecified: Secondary | ICD-10-CM

## 2014-10-04 DIAGNOSIS — I10 Essential (primary) hypertension: Secondary | ICD-10-CM | POA: Diagnosis not present

## 2014-10-04 DIAGNOSIS — E119 Type 2 diabetes mellitus without complications: Secondary | ICD-10-CM | POA: Insufficient documentation

## 2014-10-04 DIAGNOSIS — Z79899 Other long term (current) drug therapy: Secondary | ICD-10-CM | POA: Insufficient documentation

## 2014-10-04 DIAGNOSIS — E876 Hypokalemia: Secondary | ICD-10-CM

## 2014-10-04 DIAGNOSIS — Z8673 Personal history of transient ischemic attack (TIA), and cerebral infarction without residual deficits: Secondary | ICD-10-CM | POA: Insufficient documentation

## 2014-10-04 DIAGNOSIS — I2510 Atherosclerotic heart disease of native coronary artery without angina pectoris: Secondary | ICD-10-CM | POA: Insufficient documentation

## 2014-10-04 LAB — PROTIME-INR
INR: 1.02 (ref 0.00–1.49)
Prothrombin Time: 13.5 seconds (ref 11.6–15.2)

## 2014-10-04 LAB — CBC
HCT: 37.2 % (ref 36.0–46.0)
Hemoglobin: 12.4 g/dL (ref 12.0–15.0)
MCH: 27.6 pg (ref 26.0–34.0)
MCHC: 33.3 g/dL (ref 30.0–36.0)
MCV: 82.9 fL (ref 78.0–100.0)
Platelets: 398 10*3/uL (ref 150–400)
RBC: 4.49 MIL/uL (ref 3.87–5.11)
RDW: 13.9 % (ref 11.5–15.5)
WBC: 6.9 10*3/uL (ref 4.0–10.5)

## 2014-10-04 LAB — I-STAT CHEM 8, ED
BUN: 6 mg/dL (ref 6–23)
Calcium, Ion: 1.15 mmol/L (ref 1.13–1.30)
Chloride: 106 mEq/L (ref 96–112)
Creatinine, Ser: 1 mg/dL (ref 0.50–1.10)
Glucose, Bld: 117 mg/dL — ABNORMAL HIGH (ref 70–99)
HCT: 39 % (ref 36.0–46.0)
Hemoglobin: 13.3 g/dL (ref 12.0–15.0)
Potassium: 3 mEq/L — ABNORMAL LOW (ref 3.7–5.3)
Sodium: 143 mEq/L (ref 137–147)
TCO2: 26 mmol/L (ref 0–100)

## 2014-10-04 LAB — COMPREHENSIVE METABOLIC PANEL
ALT: 10 U/L (ref 0–35)
AST: 16 U/L (ref 0–37)
Albumin: 3.4 g/dL — ABNORMAL LOW (ref 3.5–5.2)
Alkaline Phosphatase: 117 U/L (ref 39–117)
Anion gap: 15 (ref 5–15)
BUN: 8 mg/dL (ref 6–23)
CO2: 27 mEq/L (ref 19–32)
Calcium: 9.8 mg/dL (ref 8.4–10.5)
Chloride: 104 mEq/L (ref 96–112)
Creatinine, Ser: 0.95 mg/dL (ref 0.50–1.10)
GFR calc Af Amer: 73 mL/min — ABNORMAL LOW (ref >90)
GFR calc non Af Amer: 63 mL/min — ABNORMAL LOW (ref >90)
Glucose, Bld: 113 mg/dL — ABNORMAL HIGH (ref 70–99)
Potassium: 3.3 mEq/L — ABNORMAL LOW (ref 3.7–5.3)
Sodium: 146 mEq/L (ref 137–147)
Total Bilirubin: 0.2 mg/dL — ABNORMAL LOW (ref 0.3–1.2)
Total Protein: 7.4 g/dL (ref 6.0–8.3)

## 2014-10-04 LAB — DIFFERENTIAL
Basophils Absolute: 0 10*3/uL (ref 0.0–0.1)
Basophils Relative: 0 % (ref 0–1)
Eosinophils Absolute: 0.1 10*3/uL (ref 0.0–0.7)
Eosinophils Relative: 1 % (ref 0–5)
Lymphocytes Relative: 41 % (ref 12–46)
Lymphs Abs: 2.8 10*3/uL (ref 0.7–4.0)
Monocytes Absolute: 0.4 10*3/uL (ref 0.1–1.0)
Monocytes Relative: 6 % (ref 3–12)
Neutro Abs: 3.6 10*3/uL (ref 1.7–7.7)
Neutrophils Relative %: 52 % (ref 43–77)

## 2014-10-04 LAB — I-STAT TROPONIN, ED: Troponin i, poc: 0.05 ng/mL (ref 0.00–0.08)

## 2014-10-04 LAB — ETHANOL: Alcohol, Ethyl (B): <11 mg/dL (ref 0–11)

## 2014-10-04 LAB — APTT: aPTT: 28 seconds (ref 24–37)

## 2014-10-04 MED ORDER — POTASSIUM CHLORIDE CRYS ER 20 MEQ PO TBCR
40.0000 meq | EXTENDED_RELEASE_TABLET | Freq: Once | ORAL | Status: AC
  Administered 2014-10-04: 40 meq via ORAL
  Filled 2014-10-04: qty 2

## 2014-10-04 MED ORDER — POTASSIUM CHLORIDE CRYS ER 20 MEQ PO TBCR: 20.0000 meq | EXTENDED_RELEASE_TABLET | Freq: Two times a day (BID) | ORAL | Status: DC

## 2014-10-04 NOTE — ED Notes (Signed)
Pt here for code stroke, per ems called out for tachycardia, on arrival of ems at 1150 pt had onset of aphasia and left side weakness, pt speaking on arrival, tearful states that she cant see, pt will hold left arm up when putting blood pressure cuff on but when asked by neuro to do the same pt does not put arm up, pt does not have droop noted.

## 2014-10-04 NOTE — Code Documentation (Signed)
63yo female arriving to [facility] via [place] at 1227.  EMS reports that they were called for tachycardia as the patient reported her "heart was racing."  She reports that she was about to eat when this occurred.  EMS assessed the patient and she required assistance to the stretcher. The patient was reportedly interacting appropriately and at 1150 EMS assessed left facial droop and left sided weakness as well as became nonverbal.  EMS reports that the patient was communicating by nodding her head, but no speech en route.  EMS reports patient with headache and nausea, h/o MI and stroke.  Patient taken to CT on arrival.  Stroke team at bedside.  Patient now answering questions.  Exam with inconsistent findings r/t facial droop and left sided weakness.  Patient with intermittent episodes of crying and giggling during exam.  NIHSS 8, see documentation for details and code stroke times.  Patient to MRI per Dr. Armida Sans.  DWI negative per Dr. Armida Sans.  Code stroke canceled.  Bedside handoff with ED RN Lilia Pro.

## 2014-10-04 NOTE — ED Provider Notes (Signed)
CSN: [id number]     Arrival date & time 10/04/14  1227 History   First MD Initiated Contact with Patient 10/04/14 1230     Chief Complaint  Patient presents with  . Code Stroke     (Consider location/radiation/quality/duration/timing/severity/associated sxs/prior Treatment) HPI 63 y.o. Female presents via ems with report that she had a witnessed stroke with last known normal at 1150.  EMS was called out for palpitations and while they were assessing her, she had left sided facial droop and left sided weakness.   Past Medical History  Diagnosis Date  . DM (diabetes mellitus)   . HTN (hypertension)   . Diverticulitis   . Arthritis   . Coronary artery disease   . Stroke   . Short-term memory loss   . Myocardial infarction    Past Surgical History  Procedure Laterality Date  . Hemicolectomy    . Lumbar disc surgery    . Roux-en-y gastric bypass    . Abdominal hysterectomy    . Cholecystectomy    . Eye surgery    . Hand surgery    . Foot surgery     Family History  Problem Relation Age of Onset  . Cancer Father 60    Lung  . Coronary artery disease Father 25  . COPD Mother   . Cancer Paternal Aunt     Breast  . Cancer Paternal Grandmother     Breast   History  Substance Use Topics  . Smoking status: Never Smoker   . Smokeless tobacco: Never Used  . Alcohol Use: No   OB History   Grav Para Term Preterm Abortions TAB SAB Ect Mult Living                 Review of Systems  All other systems reviewed and are negative.     Allergies  Lotrel and Sulfa antibiotics  Home Medications   Prior to Admission medications   Medication Sig Start Date End Date Taking? Authorizing Provider  amLODipine (NORVASC) 10 MG tablet Take 10 mg by mouth daily.    Historical Provider, MD  aspirin EC 325 MG EC tablet Take 1 tablet (325 mg total) by mouth daily. 11/18/12   Sheila Oats, MD  hydrochlorothiazide (HYDRODIURIL) 25 MG tablet Take 25 mg by mouth daily.    Historical  Provider, MD  metFORMIN (GLUCOPHAGE) 500 MG tablet Take 500 mg by mouth 2 (two) times daily with a meal.    Historical Provider, MD   SpO2 98% Physical Exam  Nursing note and vitals reviewed. Constitutional: She is oriented to person, place, and time. She appears well-developed and well-nourished.  HENT:  Head: Normocephalic and atraumatic.  Right Ear: External ear normal.  Left Ear: External ear normal.  Nose: Nose normal.  Mouth/Throat: Oropharynx is clear and moist.  Eyes: Conjunctivae and EOM are normal. Pupils are equal, round, and reactive to light.  Neck: Normal range of motion. Neck supple.  Cardiovascular: Normal rate.   Pulmonary/Chest: Effort normal and breath sounds normal.  Abdominal: Soft. Bowel sounds are normal.  Neurological: She is alert and oriented to person, place, and time. She displays normal reflexes. No cranial nerve deficit. Coordination normal.  Patient with bilateral hand weakness.  Able to move toes equally bilaterally.     ED Course  Procedures (including critical care time) Labs Review Labs Reviewed  I-STAT CHEM 8, ED - Abnormal; Notable for the following:    Potassium 3.0 (*)    Glucose, Bld  117 (*)    All other components within normal limits  ETHANOL  PROTIME-INR  APTT  CBC  DIFFERENTIAL  COMPREHENSIVE METABOLIC PANEL  URINE RAPID DRUG SCREEN (HOSP PERFORMED)  URINALYSIS, ROUTINE W REFLEX MICROSCOPIC  I-STAT TROPOININ, ED  I-STAT TROPOININ, ED    Imaging Review No results found.   EKG Interpretation   Date/Time:  Monday October 04 2014 12:51:12 EDT Ventricular Rate:  90 PR Interval:  137 QRS Duration: 91 QT Interval:  373 QTC Calculation: 456 R Axis:   71 Text Interpretation:  Sinus rhythm Baseline wander in lead(s) II III aVF  Confirmed by Rishawn Walck MD, Andee Poles ([id number]) on 10/04/2014 1:43:01 PM      MDM   Final diagnoses:  Palpitations  Hypokalemia    Patient evaluated by neurology as code stroke.  MRI negative for  infarct.  Patient states she felt like her heart was beating fast earlier but feels fine now.  She denies pain or dyspnea.  Patient with normal neurologic exam here.  Mild hypokalemia with oral repletion ensuing. I discussed replacement with patient and advised regarding need for follow up.     Shaune Pollack, MD 10/05/14 1330

## 2014-10-04 NOTE — Discharge Instructions (Signed)
Hypokalemia Hypokalemia means that the amount of potassium in the blood is lower than normal.Potassium is a chemical, called an electrolyte, that helps regulate the amount of fluid in the body. It also stimulates muscle contraction and helps nerves function properly.Most of the body's potassium is inside of cells, and only a very small amount is in the blood. Because the amount in the blood is so small, minor changes can be life-threatening. CAUSES  Antibiotics.  Diarrhea or vomiting.  Using laxatives too much, which can cause diarrhea.  Chronic kidney disease.  Water pills (diuretics).  Eating disorders (bulimia).  Low magnesium level.  Sweating a lot. SIGNS AND SYMPTOMS  Weakness.  Constipation.  Fatigue.  Muscle cramps.  Mental confusion.  Skipped heartbeats or irregular heartbeat (palpitations).  Tingling or numbness. DIAGNOSIS  Your health care provider can diagnose hypokalemia with blood tests. In addition to checking your potassium level, your health care provider may also check other lab tests. TREATMENT Hypokalemia can be treated with potassium supplements taken by mouth or adjustments in your current medicines. If your potassium level is very low, you may need to get potassium through a vein (IV) and be monitored in the hospital. A diet high in potassium is also helpful. Foods high in potassium are:  Nuts, such as peanuts and pistachios.  Seeds, such as sunflower seeds and pumpkin seeds.  Peas, lentils, and lima beans.  Whole grain and bran cereals and breads.  Fresh fruit and vegetables, such as apricots, avocado, bananas, cantaloupe, kiwi, oranges, tomatoes, asparagus, and potatoes.  Orange and tomato juices.  Red meats.  Fruit yogurt. HOME CARE INSTRUCTIONS  Take all medicines as prescribed by your health care provider.  Maintain a healthy diet by including nutritious food, such as fruits, vegetables, nuts, whole grains, and lean meats.  If  you are taking a laxative, be sure to follow the directions on the label. SEEK MEDICAL CARE IF:  Your weakness gets worse.  You feel your heart pounding or racing.  You are vomiting or having diarrhea.  You are diabetic and having trouble keeping your blood glucose in the normal range. SEEK IMMEDIATE MEDICAL CARE IF:  You have chest pain, shortness of breath, or dizziness.  You are vomiting or having diarrhea for more than 2 days.  You faint. MAKE SURE YOU:   Understand these instructions.  Will watch your condition.  Will get help right away if you are not doing well or get worse. Document Released: 12/03/2005 Document Revised: 09/23/2013 Document Reviewed: 06/05/2013 [facility] Patient Information 2015 [place], [place]. This information is not intended to replace advice given to you by your health care provider. Make sure you discuss any questions you have with your health care provider. Palpitations A palpitation is the feeling that your heartbeat is irregular. It may feel like your heart is fluttering or skipping a beat. It may also feel like your heart is beating faster than normal. This is usually not a serious problem. In some cases, you may need more medical tests. HOME CARE  Avoid:  Caffeine in coffee, tea, soft drinks, diet pills, and energy drinks.  Chocolate.  Alcohol.  Stop smoking if you smoke.  Reduce your stress and anxiety. Try:  A method that measures bodily functions so you can learn to control them (biofeedback).  Yoga.  Meditation.  Physical activity such as swimming, jogging, or walking.  Get plenty of rest and sleep. GET HELP IF:  Your fast or irregular heartbeat continues after 24 hours.  Your  palpitations occur more often. GET HELP RIGHT AWAY IF:   You have chest pain.  You feel short of breath.  You have a very bad headache.  You feel dizzy or pass out (faint). MAKE SURE YOU:   Understand these instructions.  Will watch your  condition.  Will get help right away if you are not doing well or get worse. Document Released: 09/11/2008 Document Revised: 04/19/2014 Document Reviewed: 02/01/2012 [facility] Patient Information 2015 [place], [place]. This information is not intended to replace advice given to you by your health care provider. Make sure you discuss any questions you have with your health care provider.

## 2014-10-04 NOTE — Consult Note (Signed)
Referring Physician: Ray    Chief Complaint: left sided weakness left facial weakness  HPI:                                                                                                                                         Julie Acevedo is an 63 y.o. female who called EMS due to tachycardia.  On arrival EMS noted a left facial droop and patient was not verbal.  She would nod yes and no to answers but not talking.  They also noted she was not moving her left leg or arm. When was brought to [facility] via EMS as code stroke. On arrival she showed no facial droop and C/O HA along with intermittently moving left arm and leg. Initial CT head was negative. During exam she is very emotional and intermittently following commands.  Follow up MRI was also negative.   Date last known well: Date: 10/04/2014 Time last known well: Time: 11:50 tPA Given: No: functional exam, negative MRI  Past Medical History  Diagnosis Date  . DM (diabetes mellitus)   . HTN (hypertension)   . Diverticulitis   . Arthritis   . Coronary artery disease   . Stroke   . Short-term memory loss   . Myocardial infarction     Past Surgical History  Procedure Laterality Date  . Hemicolectomy    . Lumbar disc surgery    . Roux-en-y gastric bypass    . Abdominal hysterectomy    . Cholecystectomy    . Eye surgery    . Hand surgery    . Foot surgery      Family History  Problem Relation Age of Onset  . Cancer Father 79    Lung  . Coronary artery disease Father 67  . COPD Mother   . Cancer Paternal Aunt     Breast  . Cancer Paternal Grandmother     Breast   Social History:  reports that she has never smoked. She has never used smokeless tobacco. She reports that she does not drink alcohol or use illicit drugs.  Allergies:  Allergies  Allergen Reactions  . Lotrel [Amlodipine Besy-Benazepril Hcl] Anaphylaxis  . Sulfa Antibiotics Hives    Medications:                                                                                                                            .  No current facility-administered medications for this encounter.   Current Outpatient Prescriptions  Medication Sig Dispense Refill  . amLODipine (NORVASC) 10 MG tablet Take 10 mg by mouth daily.      Marland Kitchen aspirin EC 325 MG EC tablet Take 1 tablet (325 mg total) by mouth daily.  30 tablet  0  . hydrochlorothiazide (HYDRODIURIL) 25 MG tablet Take 25 mg by mouth daily.      . metFORMIN (GLUCOPHAGE) 500 MG tablet Take 500 mg by mouth 2 (two) times daily with a meal.         ROS:                                                                                                                                       History obtained from the patient  General ROS: negative for - chills, fatigue, fever, night sweats, weight gain or weight loss Psychological ROS: negative for - behavioral disorder, hallucinations, memory difficulties, mood swings or suicidal ideation Ophthalmic ROS: negative for - blurry vision, double vision, eye pain or loss of vision ENT ROS: negative for - epistaxis, nasal discharge, oral lesions, sore throat, tinnitus or vertigo Allergy and Immunology ROS: negative for - hives or itchy/watery eyes Hematological and Lymphatic ROS: negative for - bleeding problems, bruising or swollen lymph nodes Endocrine ROS: negative for - galactorrhea, hair pattern changes, polydipsia/polyuria or temperature intolerance Respiratory ROS: negative for - cough, hemoptysis, shortness of breath or wheezing Cardiovascular ROS: negative for - chest pain, dyspnea on exertion, edema or irregular heartbeat Gastrointestinal ROS: negative for - abdominal pain, diarrhea, hematemesis, nausea/vomiting or stool incontinence Genito-Urinary ROS: negative for - dysuria, hematuria, incontinence or urinary frequency/urgency Musculoskeletal ROS: negative for - joint swelling or muscular weakness Neurological ROS: as noted in HPI Dermatological ROS:  negative for rash and skin lesion changes  Physical exam: pleasant female in no apparent distress but with labile affect and crying. Blood pressure 135/59, pulse 89, temperature 97.7 F (36.5 C), temperature source Oral, resp. rate 18, height 5\' 2"  (1.575 m), weight 69.4 kg (153 lb), SpO2 100.00%. Head: normocephalic. Neck: supple, no bruits, no JVD. Cardiac: no murmurs. Lungs: clear. Abdomen: soft, no tender, no mass. Extremities: no edema.  Neurologic Examination:                                                                                                      General: NAD Mental Status: Alert, oriented to hospital but not year, intermittently .  Speech fluent  without evidence of aphasia.  Able to follow 3 step commands without difficulty. Cranial Nerves: II: Discs flat bilaterally; Visual fields grossly normal, pupils equal, round, reactive to light and accommodation III,IV, VI: ptosis not present, extra-ocular motions intact bilaterally V,VII: smile symmetric, splits midline to both PP and vibration VIII: hearing normal bilaterally IX,X: gag reflex present XI: bilateral shoulder shrug XII: midline tongue extension without atrophy or fasciculations  Motor: Right : Upper extremity   5/5    Left:     Upper extremity   3/5  Lower extremity   5/5     Lower extremity   3/5 --able to lift left arm when not asked and showed inconsistent strength --left leg able to lift off bed but inconsistent Tone and bulk:normal tone throughout; no atrophy noted Sensory: decreased throughout left arm and leg Deep Tendon Reflexes:  Right: Upper Extremity   Left: Upper extremity   biceps (C-5 to C-6) 2/4   biceps (C-5 to C-6) 2/4 tricep (C7) 2/4    triceps (C7) 2/4 Brachioradialis (C6) 2/4  Brachioradialis (C6) 2/4  Lower Extremity Lower Extremity  quadriceps (L-2 to L-4) 2/4   quadriceps (L-2 to L-4) 2/4 Achilles (S1) 2/4   Achilles (S1) 2/4  Plantars: Right: downgoing   Left:  downgoing Cerebellar: normal finger-to-nose,  normal heel-to-shin test Gait: not tested CV: pulses palpable throughout    Lab Results: Basic Metabolic Panel:  Recent Labs Lab 10/04/14 1237  NA 143  K 3.0*  CL 106  GLUCOSE 117*  BUN 6  CREATININE 1.00    Liver Function Tests: No results found for this basename: AST, ALT, ALKPHOS, BILITOT, PROT, ALBUMIN,  in the last 168 hours No results found for this basename: LIPASE, AMYLASE,  in the last 168 hours No results found for this basename: AMMONIA,  in the last 168 hours  CBC:  Recent Labs Lab 10/04/14 1231 10/04/14 1237  WBC 6.9  --   NEUTROABS 3.6  --   HGB 12.4 13.3  HCT 37.2 39.0  MCV 82.9  --   PLT 398  --     Cardiac Enzymes: No results found for this basename: CKTOTAL, CKMB, CKMBINDEX, TROPONINI,  in the last 168 hours  Lipid Panel: No results found for this basename: CHOL, TRIG, HDL, CHOLHDL, VLDL, LDLCALC,  in the last 168 hours  CBG: No results found for this basename: GLUCAP,  in the last 168 hours  Microbiology: Results for orders placed during the hospital encounter of 08/28/10  SURGICAL PCR SCREEN     Status: Abnormal   Collection Time    08/18/10  8:30 AM      Result Value Ref Range Status   MRSA, PCR NEGATIVE  NEGATIVE Final   Staphylococcus aureus   (*) NEGATIVE Final   Value: POSITIVE            The Xpert SA Assay (FDA     approved for NASAL specimens     only), is one component of     a comprehensive surveillance     program.  It is not intended     to diagnose infection nor to     guide or monitor treatment.    Coagulation Studies:  Recent Labs  10/04/14 1231  LABPROT 13.5  INR 1.02    Imaging: Ct Head Wo Contrast  10/04/2014   CLINICAL DATA:  Code stroke; left-sided facial droop with numbness and left arm drift and left-sided weakness and unequal grip; history of previous CVA  EXAM: CT HEAD WITHOUT CONTRAST  TECHNIQUE: Contiguous axial images were obtained from the base of  the skull through the vertex without intravenous contrast.  COMPARISON:  MRI of the brain of November 16, 2012 and CT scan of the brain of November 15, 2012  FINDINGS: The ventricles are normal in size and position. There is no intracranial hemorrhage nor intracranial mass effect. No acute ischemic changes are demonstrated. There are stable punctate basal ganglia calcifications bilaterally. Stable minimally decreased density in the deep white matter of both cerebral hemispheres is noted.  The observed paranasal sinuses and mastoid air cells are clear. There is no skull fracture nor cephalohematoma.  IMPRESSION: 1. There is no acute intracranial hemorrhage nor evidence of acute ischemic change. Minimal stable white matter hypodensity is present bilaterally consistent with chronic small vessel ischemia. 2. These results were called by telephone at the time of interpretation on 10/04/2014 at 12:50 pm to Dr. Aram Beecham, who verbally acknowledged these results.   Electronically Signed   By: David  Martinique   On: 10/04/2014 12:52   Assessment and plan discussed with with attending physician and they are in agreement.    Etta Quill PA-C [facility] [phone]  10/04/2014, 1:14 PM   Assessment: 63 y.o. female presenting with left sided decreased sensation and weakness.  MRI brain negative for acute stroke.  Exam finding inconsistent with labile affect. tPA not given secondary to negative MRI.   Stroke Risk Factors - diabetes mellitus and hypertension  Recommend: 1. No further stroke or neurological work up.   2. PT to eval  Neurology S/O   Patient seen and examined together with physician assistant and I concur with the assessment and plan.  Dorian Pod, MD

## 2014-10-04 NOTE — ED Notes (Signed)
Family at bedside update where patient is at. Neurology at bedside to speak with daughter at this time.

## 2014-10-04 NOTE — ED Notes (Signed)
Patient daughter, Donella Stade called: [phone]

## 2014-10-06 ENCOUNTER — Ambulatory Visit (INDEPENDENT_AMBULATORY_CARE_PROVIDER_SITE_OTHER): Payer: Medicare PPO | Admitting: Podiatry

## 2014-10-06 VITALS — BP 110/59 | HR 95 | Resp 16

## 2014-10-06 DIAGNOSIS — M76821 Posterior tibial tendinitis, right leg: Secondary | ICD-10-CM

## 2014-10-06 DIAGNOSIS — M7751 Other enthesopathy of right foot: Secondary | ICD-10-CM

## 2014-10-06 NOTE — Progress Notes (Signed)
She presents today with continued pain to the posterior tibial tendon and the navicular tuberosity of the right foot. All conservative therapies have failed and her ability to perform her daily activities is decreasing. She states that she can hardly wear shoes any longer.  Objective: Vital signs are stable she is alert and oriented x3. Large tuberosity with overlying post inflammatory hyperpigmentation in pain on palpation of the posterior tibial tendon is indicative of a partial avulsion of the navicular tuberosity and probable split tear of the posterior tibial tendon.  Assessment: Capsulitis at the navicular and talonavicular joint posterior tibial tendinitis possible tear.  Plan: Continue all conservative therapies including Cam Walker followup with me once the MRI has been performed to the right foot.

## 2014-10-08 ENCOUNTER — Ambulatory Visit: Payer: Self-pay | Admitting: Family Medicine

## 2014-10-08 ENCOUNTER — Encounter: Payer: Self-pay | Admitting: Podiatry

## 2014-10-08 NOTE — Progress Notes (Signed)
Pre cert for mri needed. auth # [id number]

## 2014-10-14 ENCOUNTER — Ambulatory Visit: Payer: Self-pay | Admitting: Podiatry

## 2014-10-18 ENCOUNTER — Telehealth: Payer: Self-pay | Admitting: *Deleted

## 2014-10-18 NOTE — Telephone Encounter (Signed)
SPOKE TO PATIENT REGARDING MRI REREAD

## 2014-11-03 ENCOUNTER — Encounter: Payer: Self-pay | Admitting: Podiatry

## 2014-11-20 IMAGING — CR DG CHEST 1V PORT
2 series · 2 of 2 positions shown · non-contrast
Comparison: 01/27/2012

CLINICAL DATA: Mental status changes.

PORTABLE CHEST - 1 VIEW

[AP (1 of 2)]
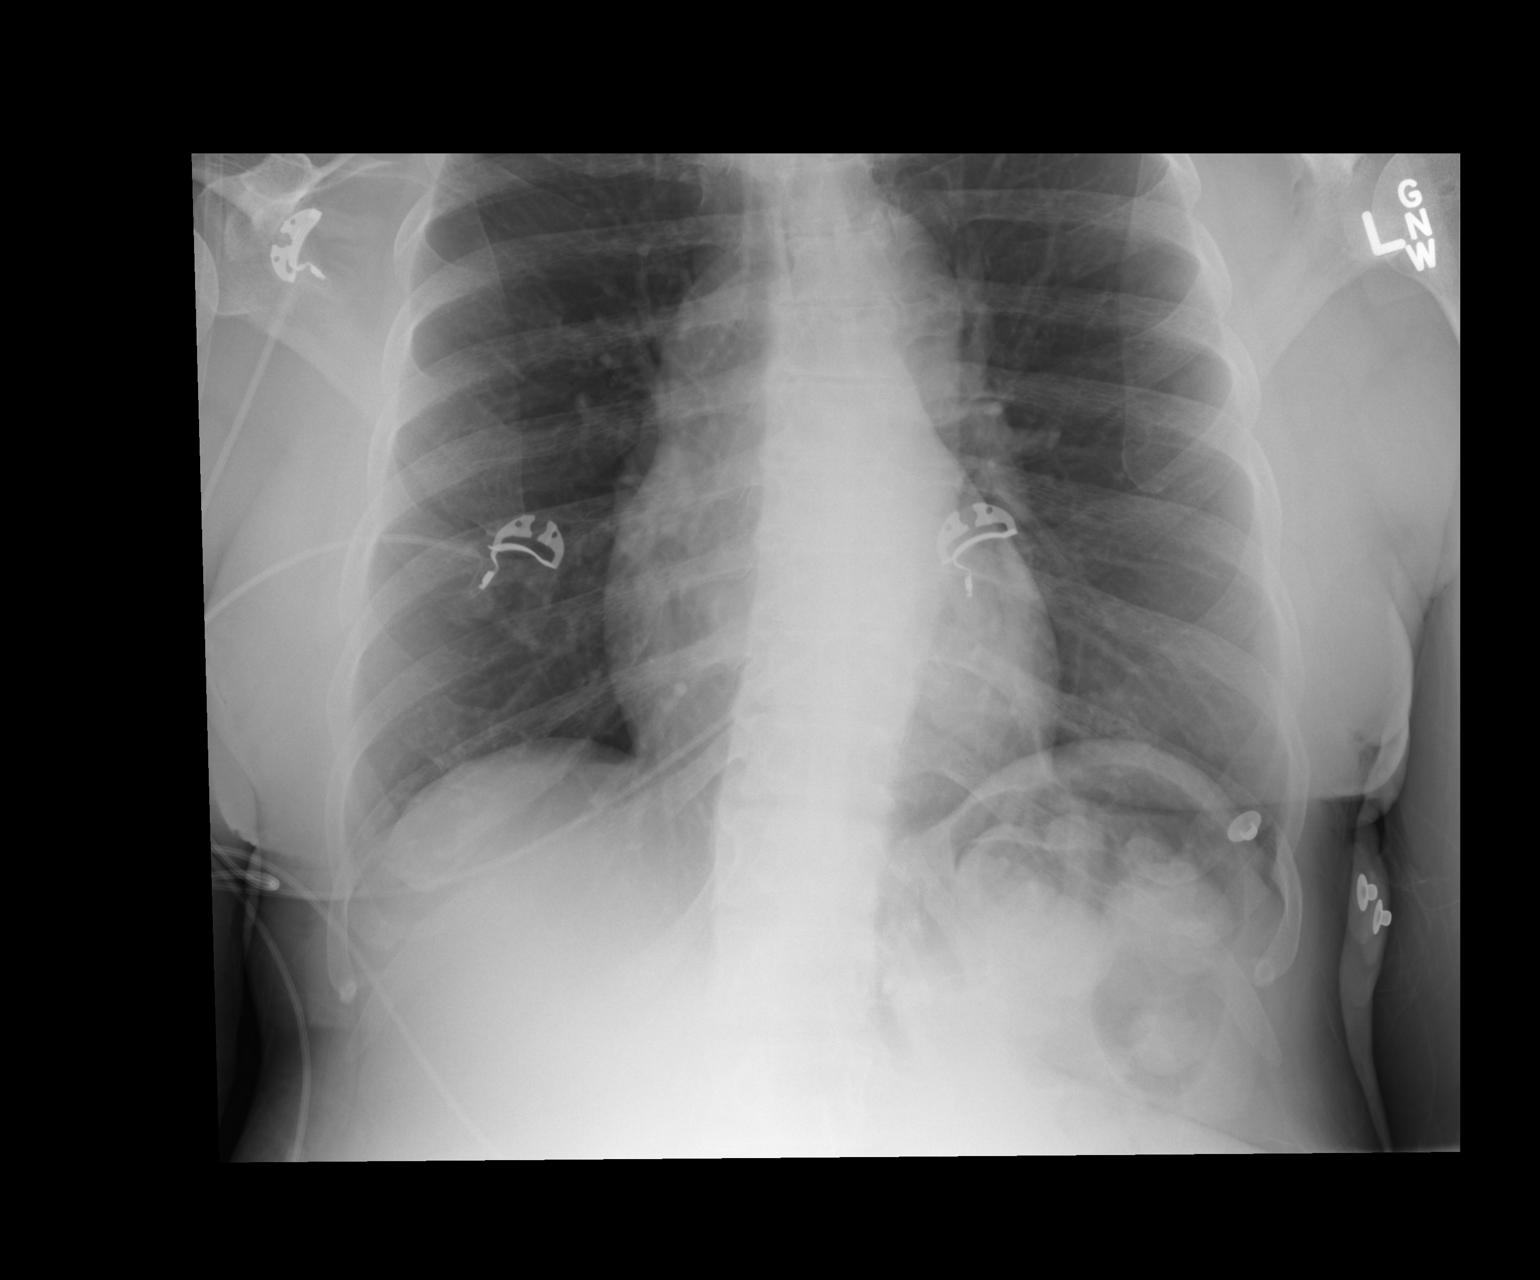

[AP (2 of 2)]
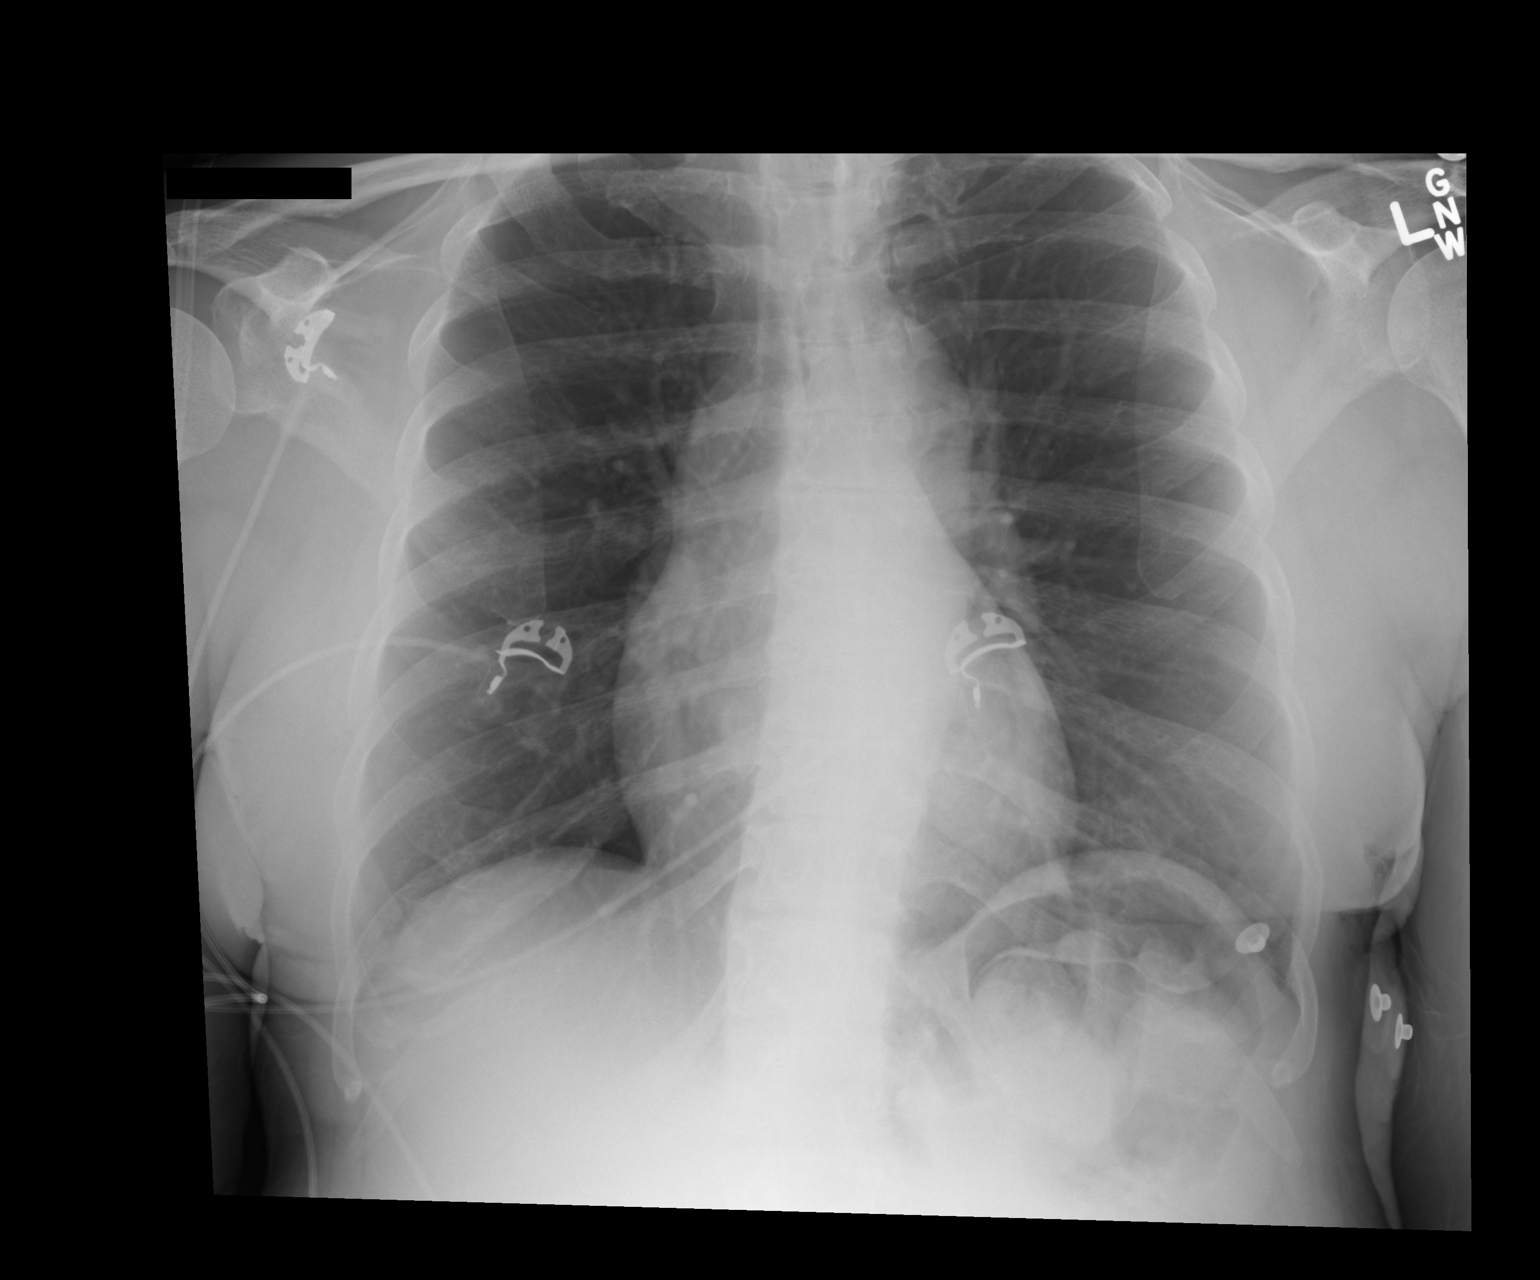

[2 of 2 positions shown; findings below may reference images not displayed]

FINDINGS: Lungs are clear and show no evidence of edema or
infiltrate.  Heart size is stable and within normal limits.  No
pleural effusions are seen.
IMPRESSION: No acute findings.

## 2014-11-20 IMAGING — CT CT HEAD W/O CM
1 of 2 series · 13 of 30 positions shown, 17 images · non-contrast
Comparison: MRI brain 07/27/2006 and unenhanced cranial CT of that
same date.

CLINICAL DATA: Code stroke.  Acute mental status changes.
Unresponsive.  Fixed caries.  Current history of hypertension and
diabetes.

CT HEAD WITHOUT CONTRAST
TECHNIQUE: Contiguous axial images were obtained from the base of
the skull through the vertex without contrast.

[Series 2: brain · axial · 0.47mm/px · z∈[+140,+262]mm · 13 of 28 slices shown, 17 images]
[im 2/28  brain]
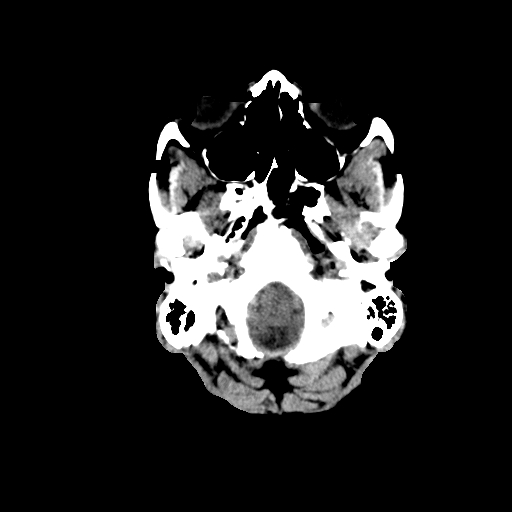
[im 2/28  bone]
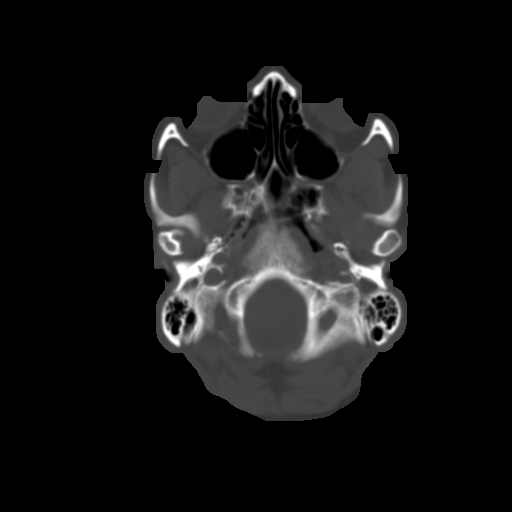
[im 4/28  brain]
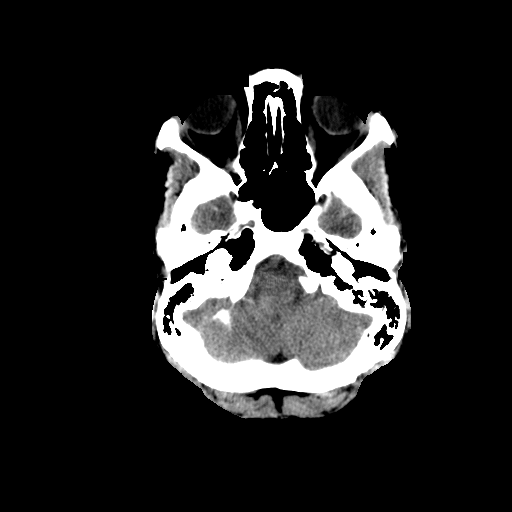
[im 6/28  brain]
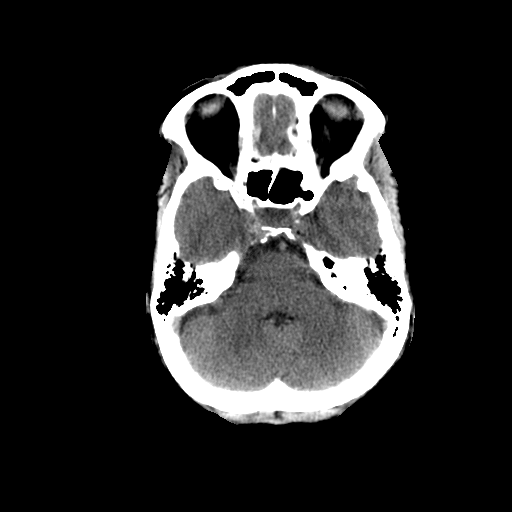
[im 8/28  brain]
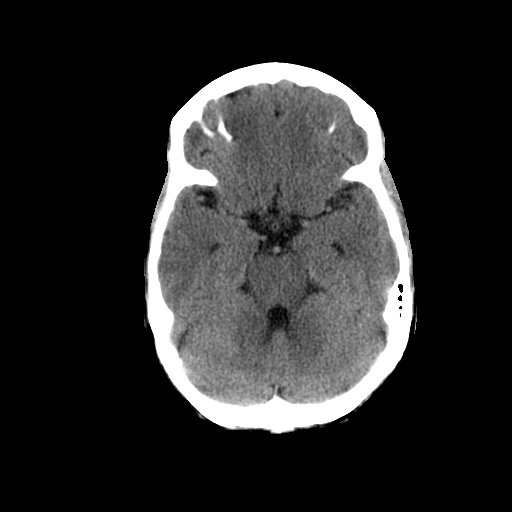
[im 10/28  brain]
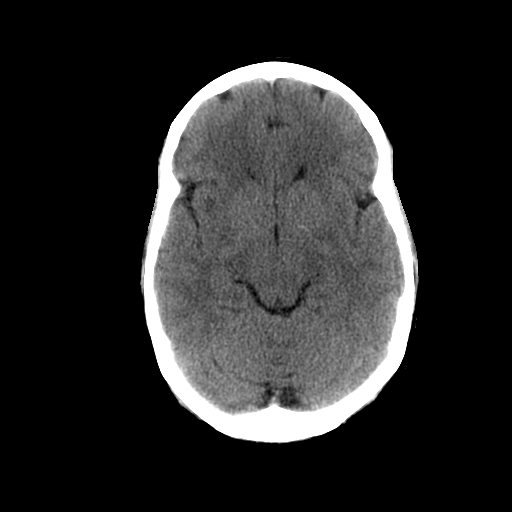
[im 10/28  bone]
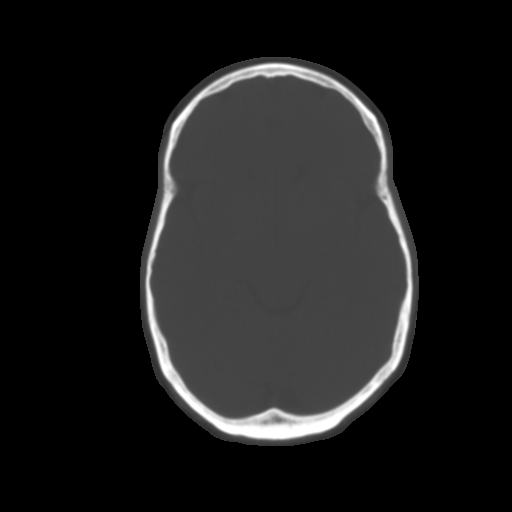
[im 12/28  brain]
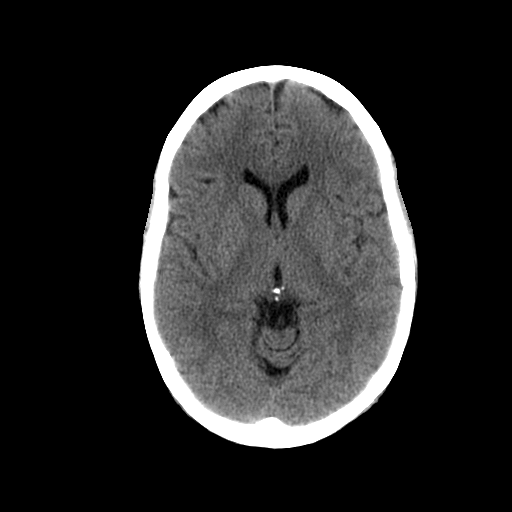
[im 14/28  brain]
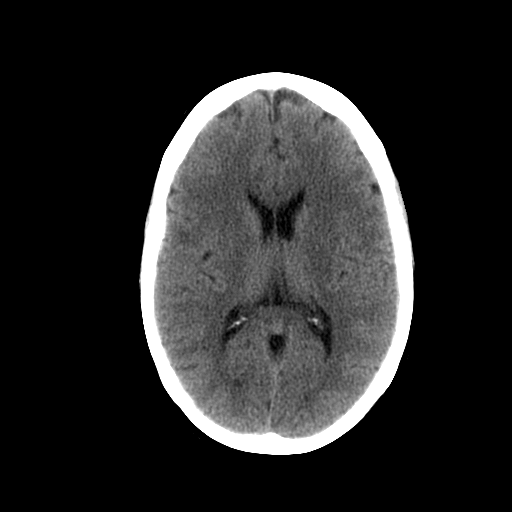
[im 16/28  brain]
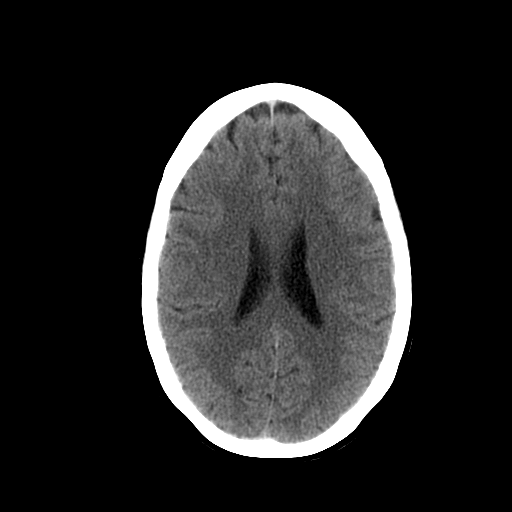
[im 18/28  brain]
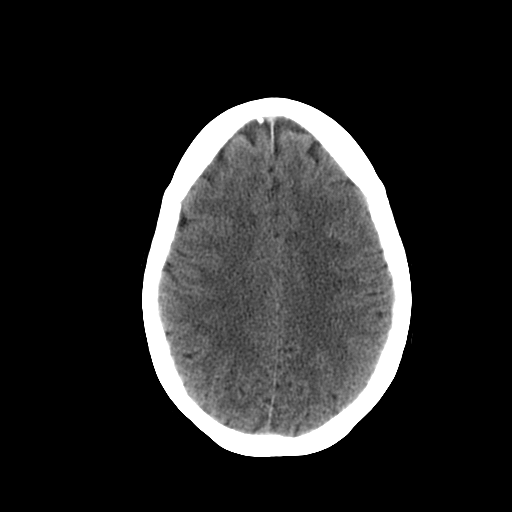
[im 18/28  bone]
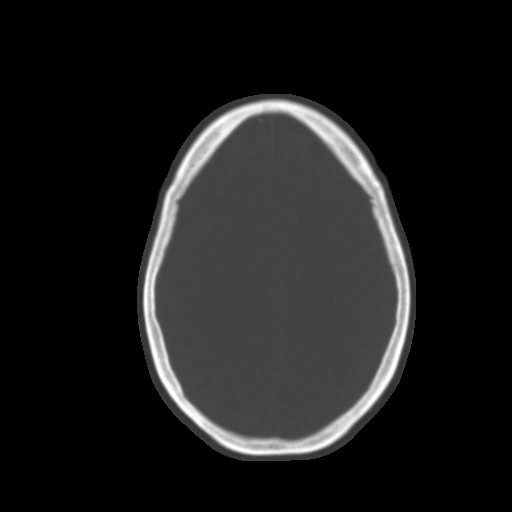
[im 20/28  brain]
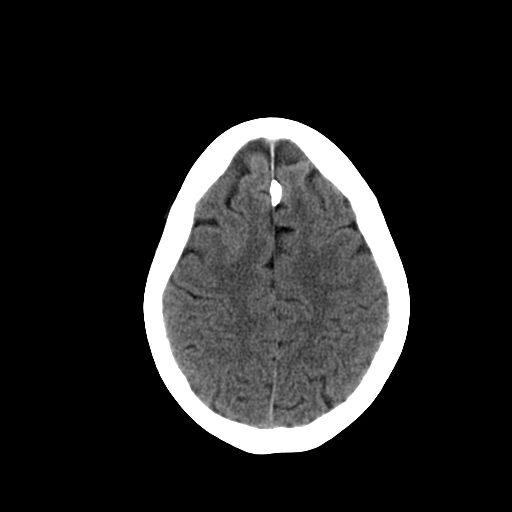
[im 22/28  brain]
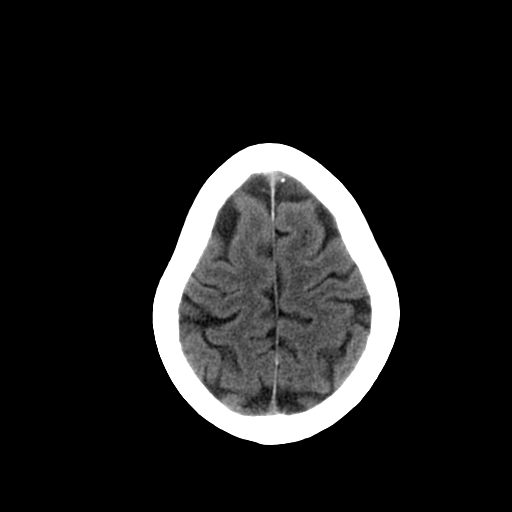
[im 24/28  brain]
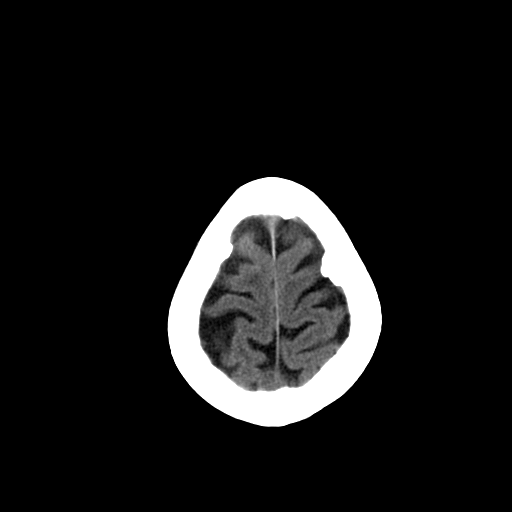
[im 26/28  brain]
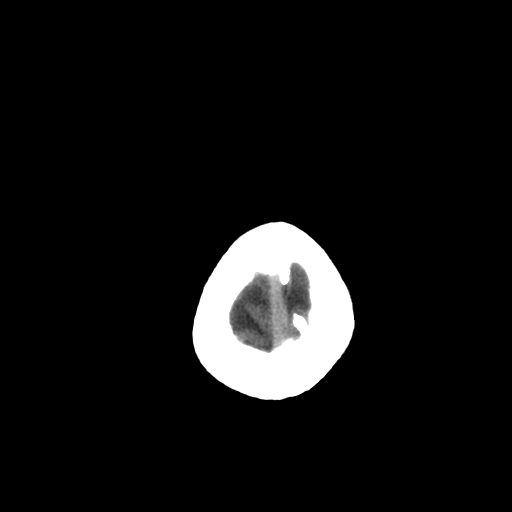
[im 26/28  bone]
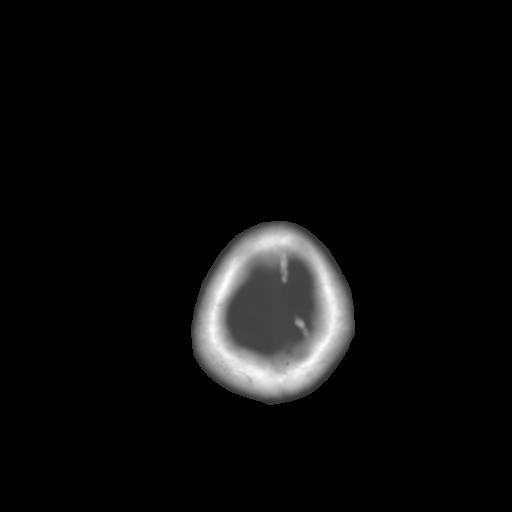

[13 of 30 positions shown; findings below may reference images not displayed]

FINDINGS: Ventricular system normal in size and appearance for age.
Mild changes of small vessel disease of the white matter diffusely,
unchanged.  Physiologic calcifications in the basal ganglia
bilaterally, unchanged.  No mass lesion.  No midline shift.  No
acute hemorrhage or hematoma.  No extra-axial fluid collections.
No evidence of acute infarction.  No significant interval change.

No skull fracture or other focal osseous abnormality involving the
skull.  Visualized paranasal sinuses, bilateral mastoid air cells,
and bilateral middle ear cavities well-aerated.
IMPRESSION: 1.  No acute intracranial abnormality.
2.  Mild chronic microvascular ischemic changes of the white matter
diffusely, stable since July 2006.

These results were called by telephone on 11/15/2012.  at 6471
hours to Dr. Ang of Neurology, who verbally acknowledged
these results.

## 2014-11-21 IMAGING — MR MR HEAD W/O CM
6 of 9 series · 35 of 48 positions shown · non-contrast
Comparison: CT head 11/15/2012

CLINICAL DATA: Acute encephalopathy.  Aphasia

MRI HEAD WITHOUT CONTRAST
TECHNIQUE: Multiplanar, multiecho pulse sequences of the brain and
surrounding structures were obtained according to standard protocol
without intravenous contrast.

[Series 3: DWI · axial · 5.0mm · 1.09mm/px · z∈[-33,+110]mm · 11 of 54 slices shown (1 of 2)]
[im 1/54]
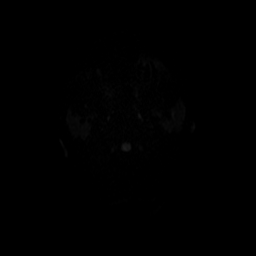
[im 6/54]
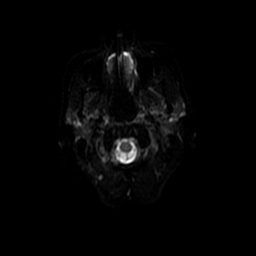
[im 11/54]
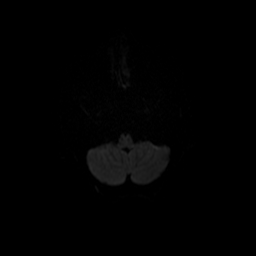
[im 16/54]
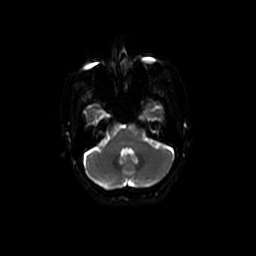
[im 22/54]
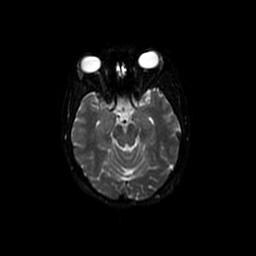
[im 27/54]
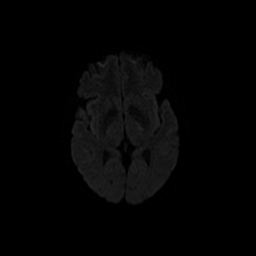
[im 32/54]
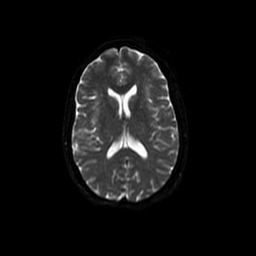
[im 38/54]
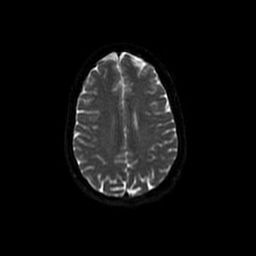
[im 43/54]
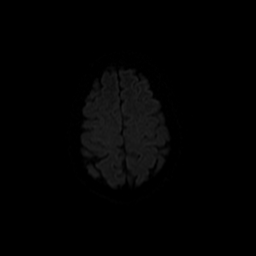
[im 48/54]
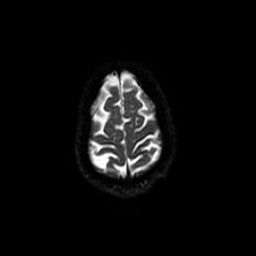
[im 54/54]
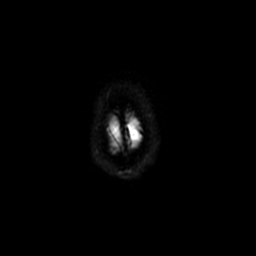

[Series 7: T2 · axial · 5.0mm · 0.47mm/px · z∈[-20,+113]mm · 4 of 20 slices shown (1 of 3)]
[im 1/20]
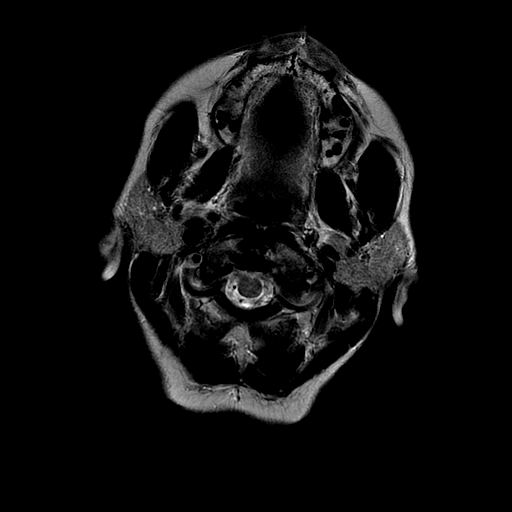
[im 7/20]
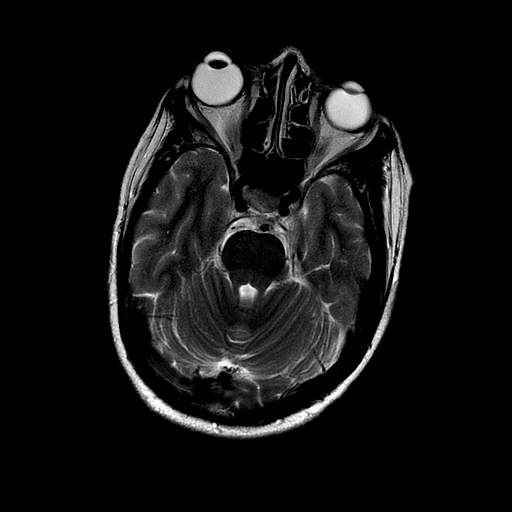
[im 13/20]
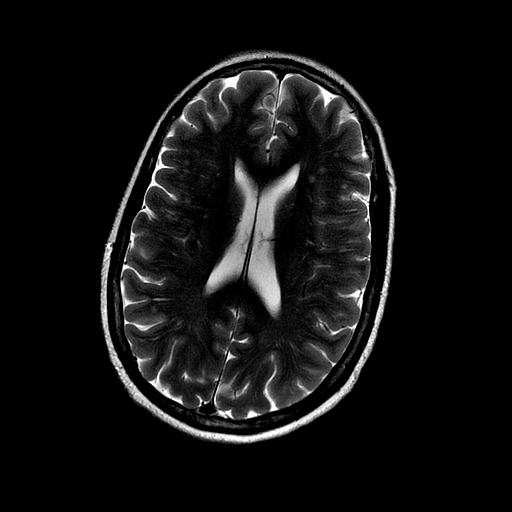
[im 20/20]
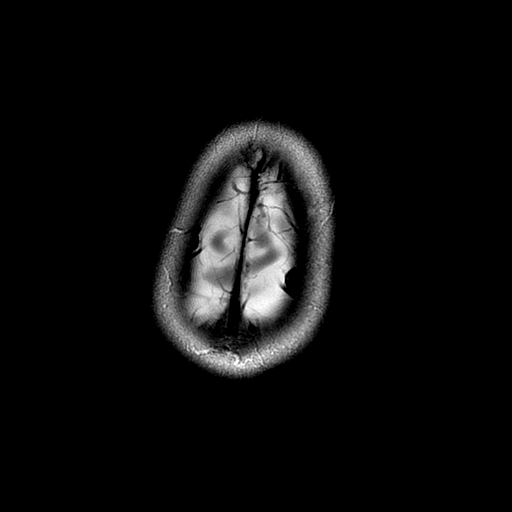

[Series 8: FLAIR · axial · 5.0mm · 0.47mm/px · z∈[-20,+113]mm · 4 of 20 slices shown]
[im 1/20]
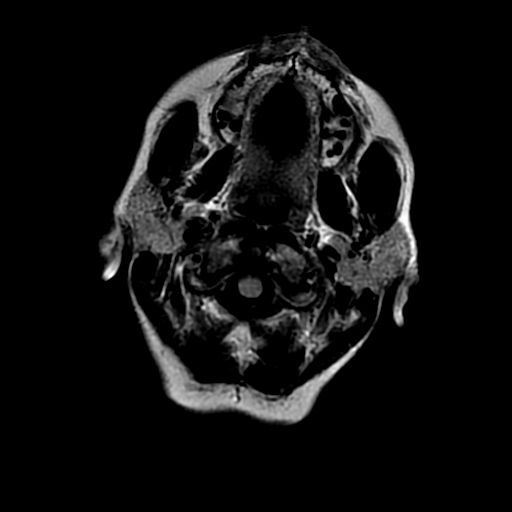
[im 7/20]
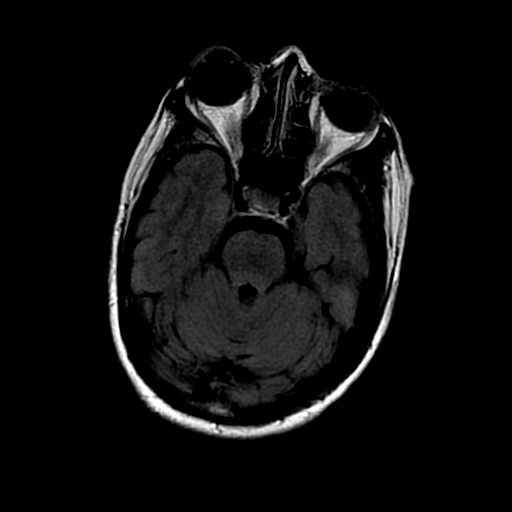
[im 13/20]
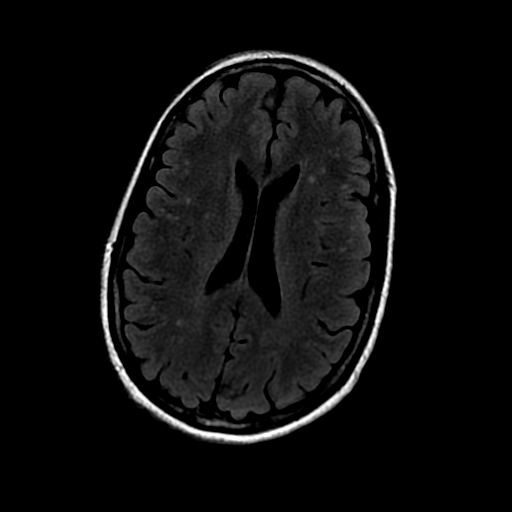
[im 20/20]
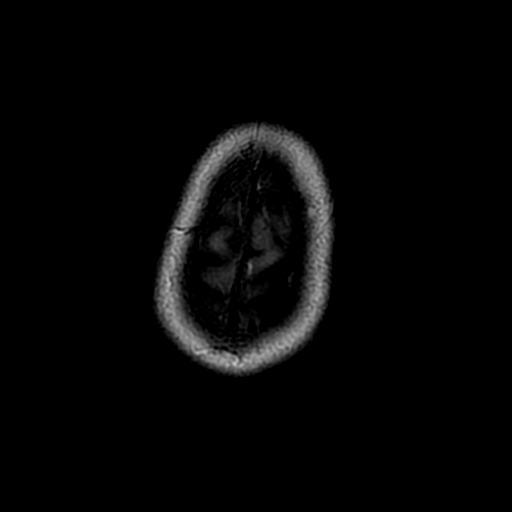

[Series 9: T2 · coronal · 5.0mm · 0.78mm/px · 5 of 26 slices shown (2 of 3)]
[im 1/26]
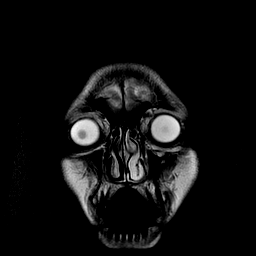
[im 7/26]
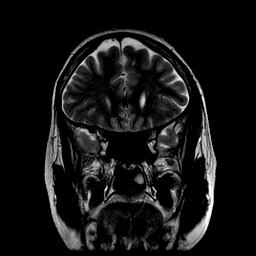
[im 13/26]
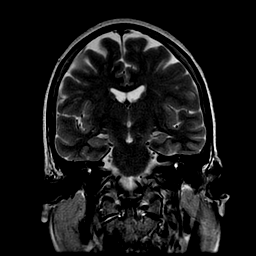
[im 19/26]
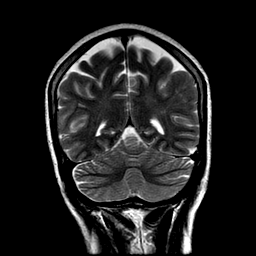
[im 26/26]
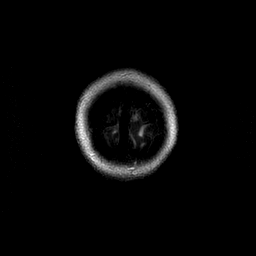

[Series 10: T2 · coronal · 3.0mm · 0.35mm/px · 5 of 28 slices shown (3 of 3)]
[im 1/28]
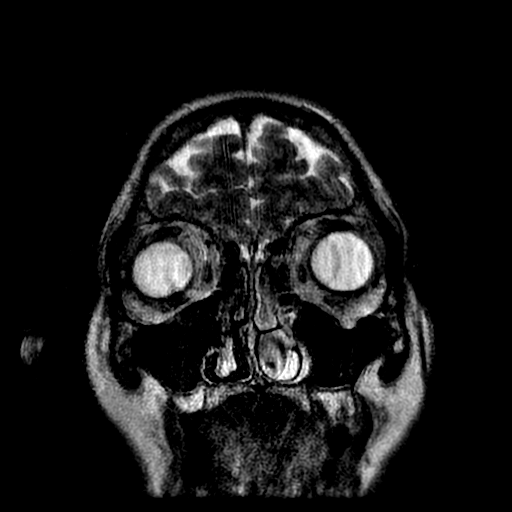
[im 6/28]
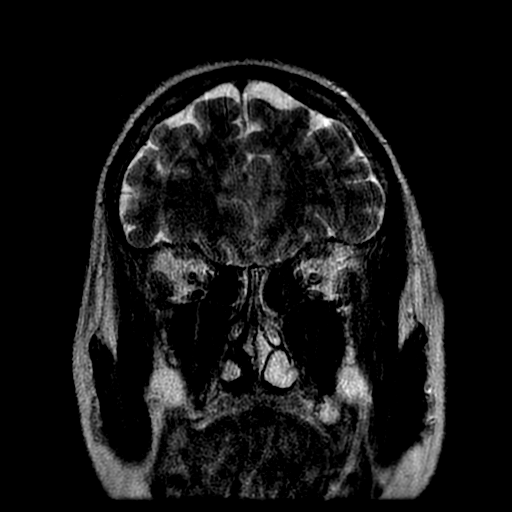
[im 11/28]
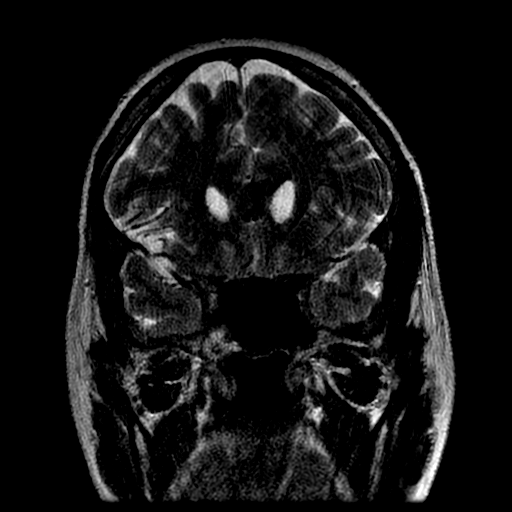
[im 17/28]
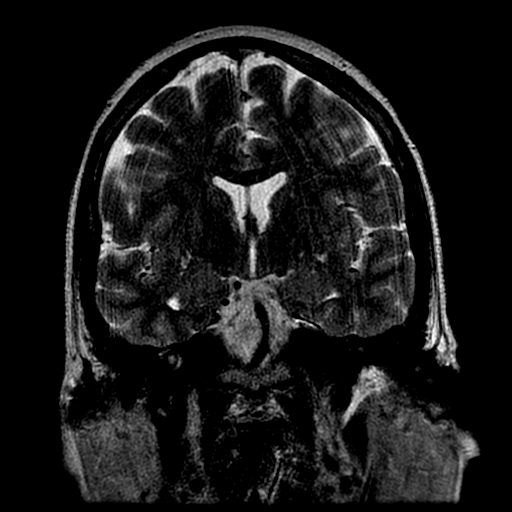
[im 22/28]
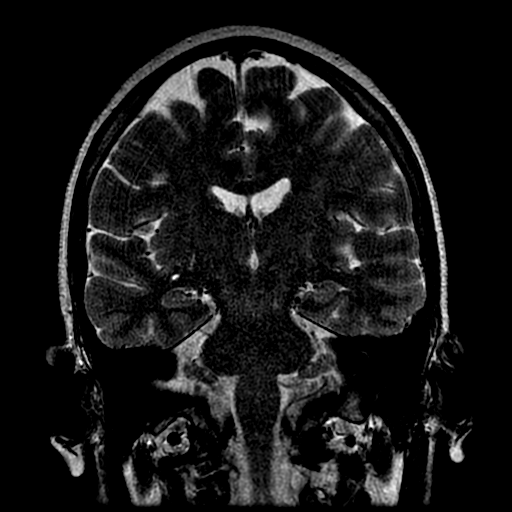

[Series 300: DWI · axial · 5.0mm · 1.09mm/px · z∈[-33,+110]mm · 6 of 27 slices shown (2 of 2)]
[im 1/27]
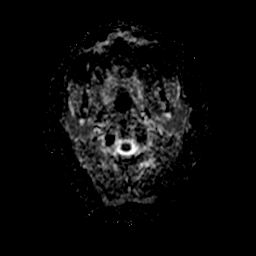
[im 6/27]
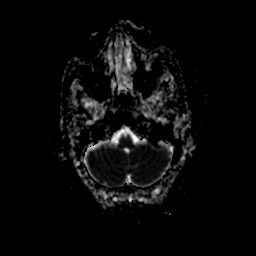
[im 11/27]
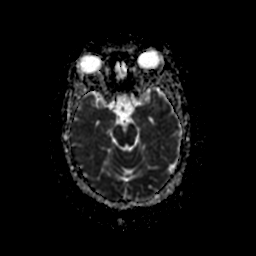
[im 16/27]
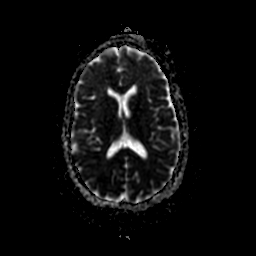
[im 21/27]
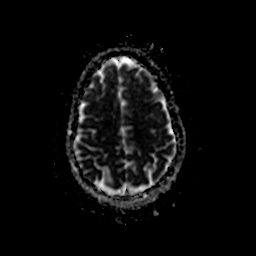
[im 27/27]
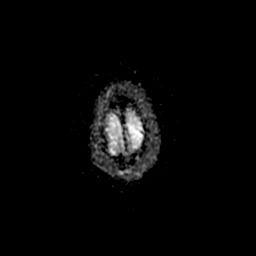

[35 of 48 positions shown; findings below may reference images not displayed]

FINDINGS: Negative for acute infarct.  Scattered small
hyperintensities in the cerebral white matter bilaterally
compatible with chronic microvascular ischemia.  No cortical
infarct.  Brainstem and cerebellum are normal.  Negative for
hemorrhage or mass lesion.

Vessels at the base of the brain are patent.  Paranasal sinuses are
clear.
IMPRESSION: Mild chronic microvascular ischemic change.  No acute infarct.

## 2014-11-22 ENCOUNTER — Ambulatory Visit (INDEPENDENT_AMBULATORY_CARE_PROVIDER_SITE_OTHER): Payer: Medicare PPO | Admitting: Podiatry

## 2014-11-22 ENCOUNTER — Encounter: Payer: Self-pay | Admitting: Podiatry

## 2014-11-22 VITALS — BP 112/62 | HR 66 | Resp 16

## 2014-11-22 DIAGNOSIS — M76821 Posterior tibial tendinitis, right leg: Secondary | ICD-10-CM

## 2014-11-22 NOTE — Progress Notes (Signed)
She presents today for follow-up of a read of her MRI. Her MRI, by positive for tendinitis at the navicular tuberosity right foot.  Objective: Tendinitis at the navicular tuberosity right foot. Pulses remain palpable.  Assessment: Navicular tendinitis.  Plan: Injected dexamethasone and local anesthetic to the point of maximal tenderness today I will follow-up with her in the near future for surgical intervention if necessary.

## 2015-01-24 ENCOUNTER — Emergency Department: Payer: Self-pay | Admitting: Emergency Medicine

## 2015-02-03 ENCOUNTER — Ambulatory Visit: Payer: Self-pay | Admitting: Obstetrics & Gynecology

## 2015-02-03 DIAGNOSIS — E119 Type 2 diabetes mellitus without complications: Secondary | ICD-10-CM

## 2015-02-03 DIAGNOSIS — I10 Essential (primary) hypertension: Secondary | ICD-10-CM

## 2015-02-10 ENCOUNTER — Ambulatory Visit: Payer: Self-pay | Admitting: Obstetrics & Gynecology

## 2015-02-21 ENCOUNTER — Ambulatory Visit: Payer: Medicare PPO | Admitting: Podiatry

## 2015-03-07 ENCOUNTER — Ambulatory Visit: Payer: Self-pay | Admitting: Urology

## 2015-03-21 ENCOUNTER — Ambulatory Visit
Admit: 2015-03-21 | Disposition: A | Discharge status: Home / Self Care | Payer: Self-pay | Attending: Urology | Admitting: Urology

## 2015-03-21 IMAGING — CR DG LUMBAR SPINE 2-3V
1 series · 3 of 3 positions shown · non-contrast
Comparison: none

REASON FOR EXAM: back pain
COMMENTS:

[Series 1: ap · 0.17mm/px · 3 of 3 slices shown]
[im 1/3]
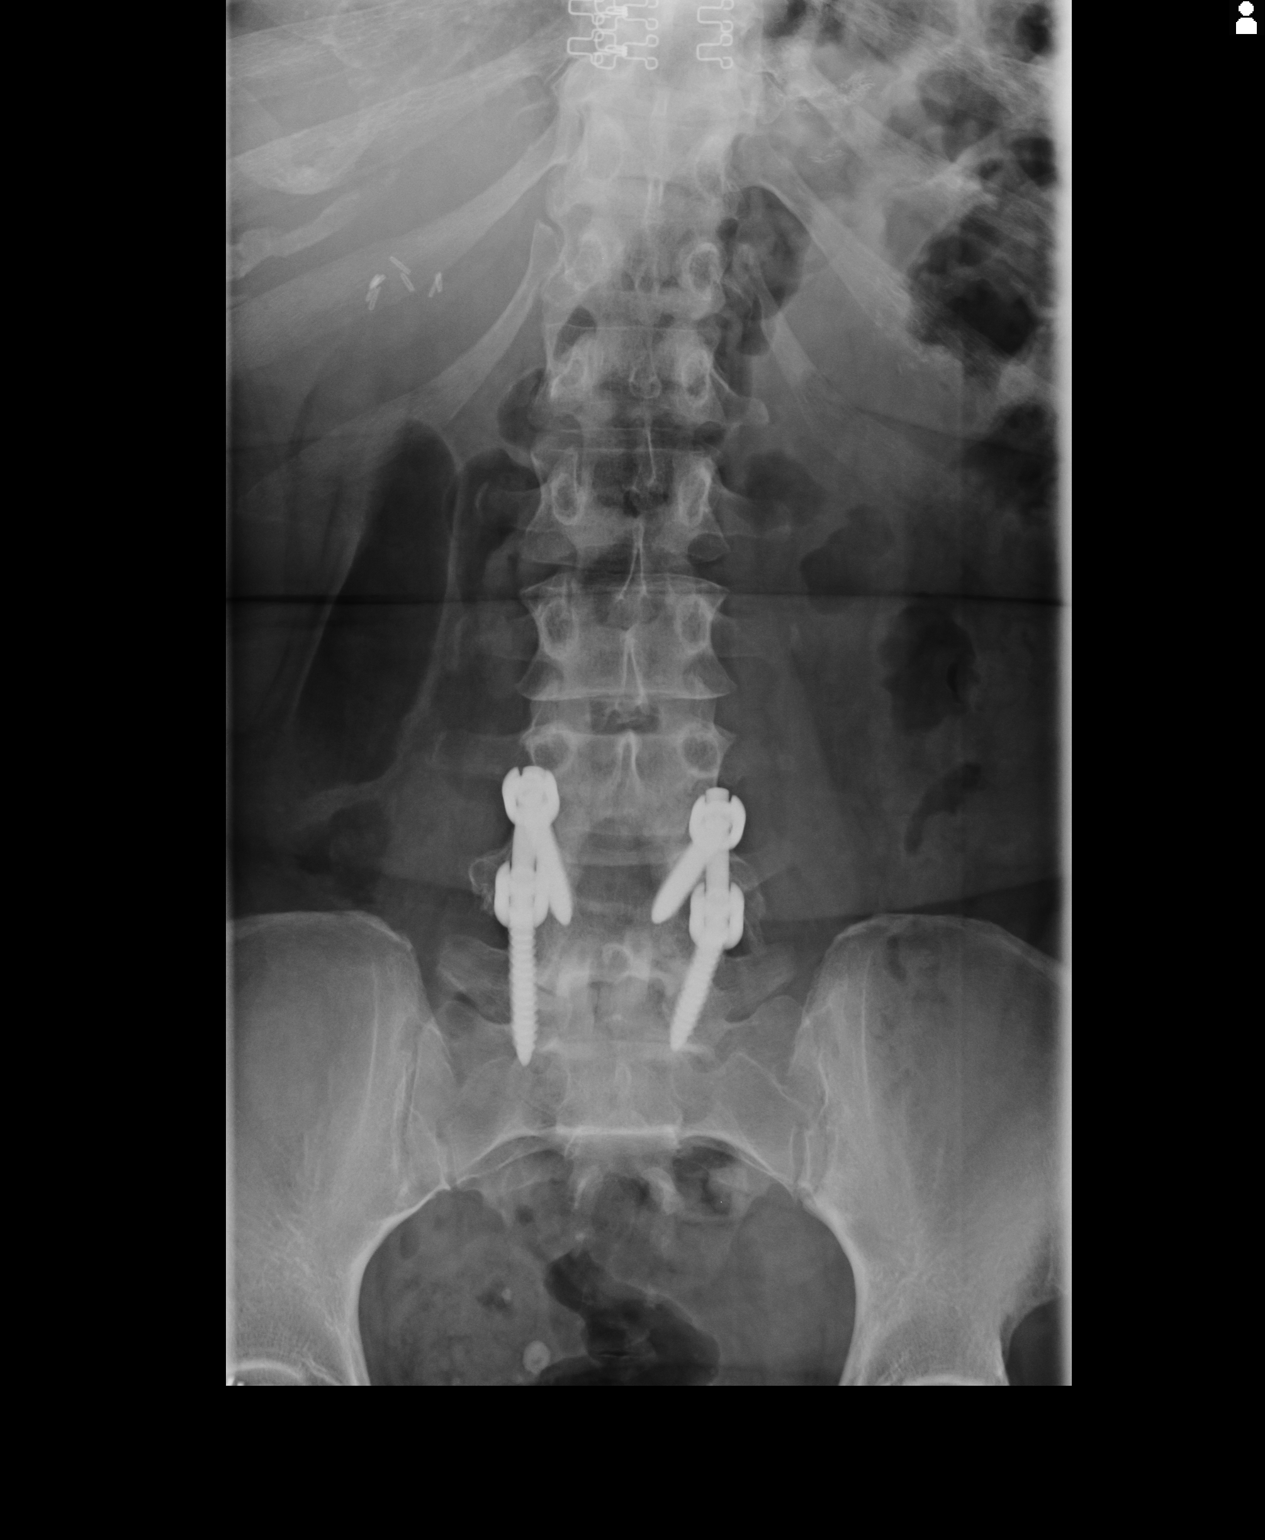
[im 2/3]
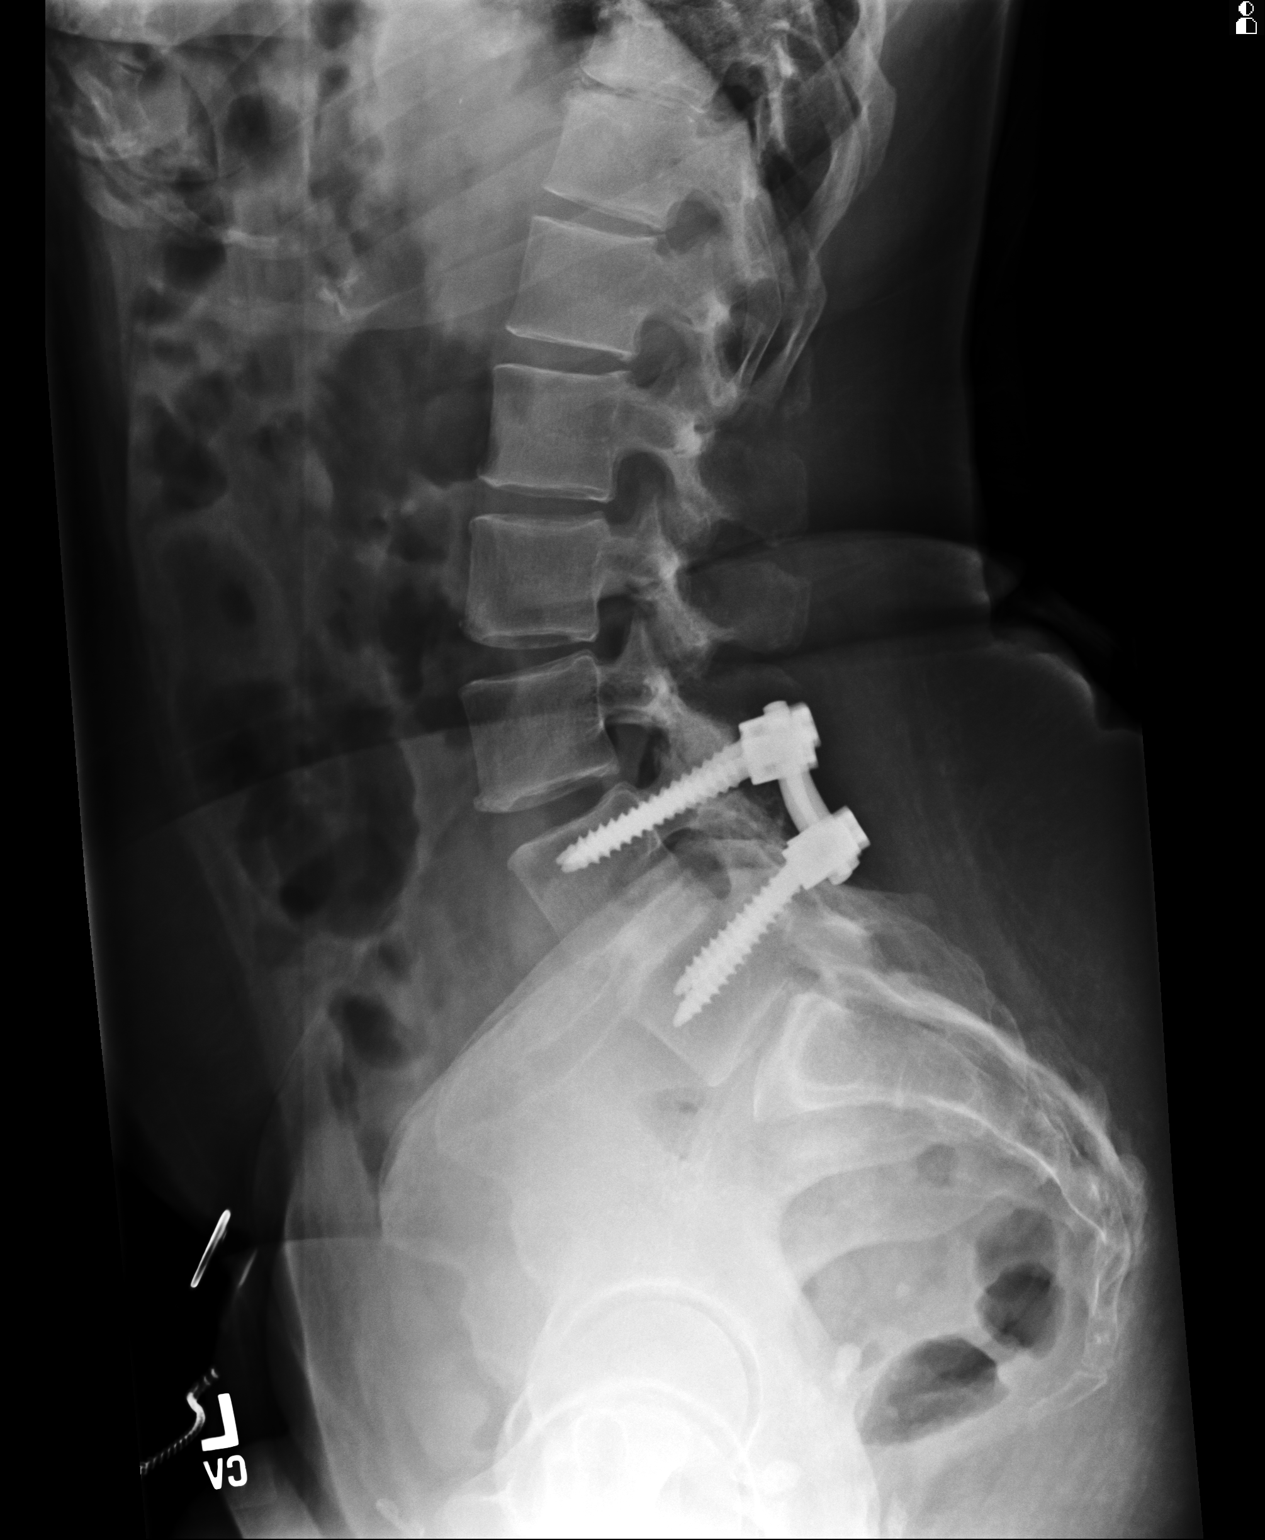
[im 3/3]
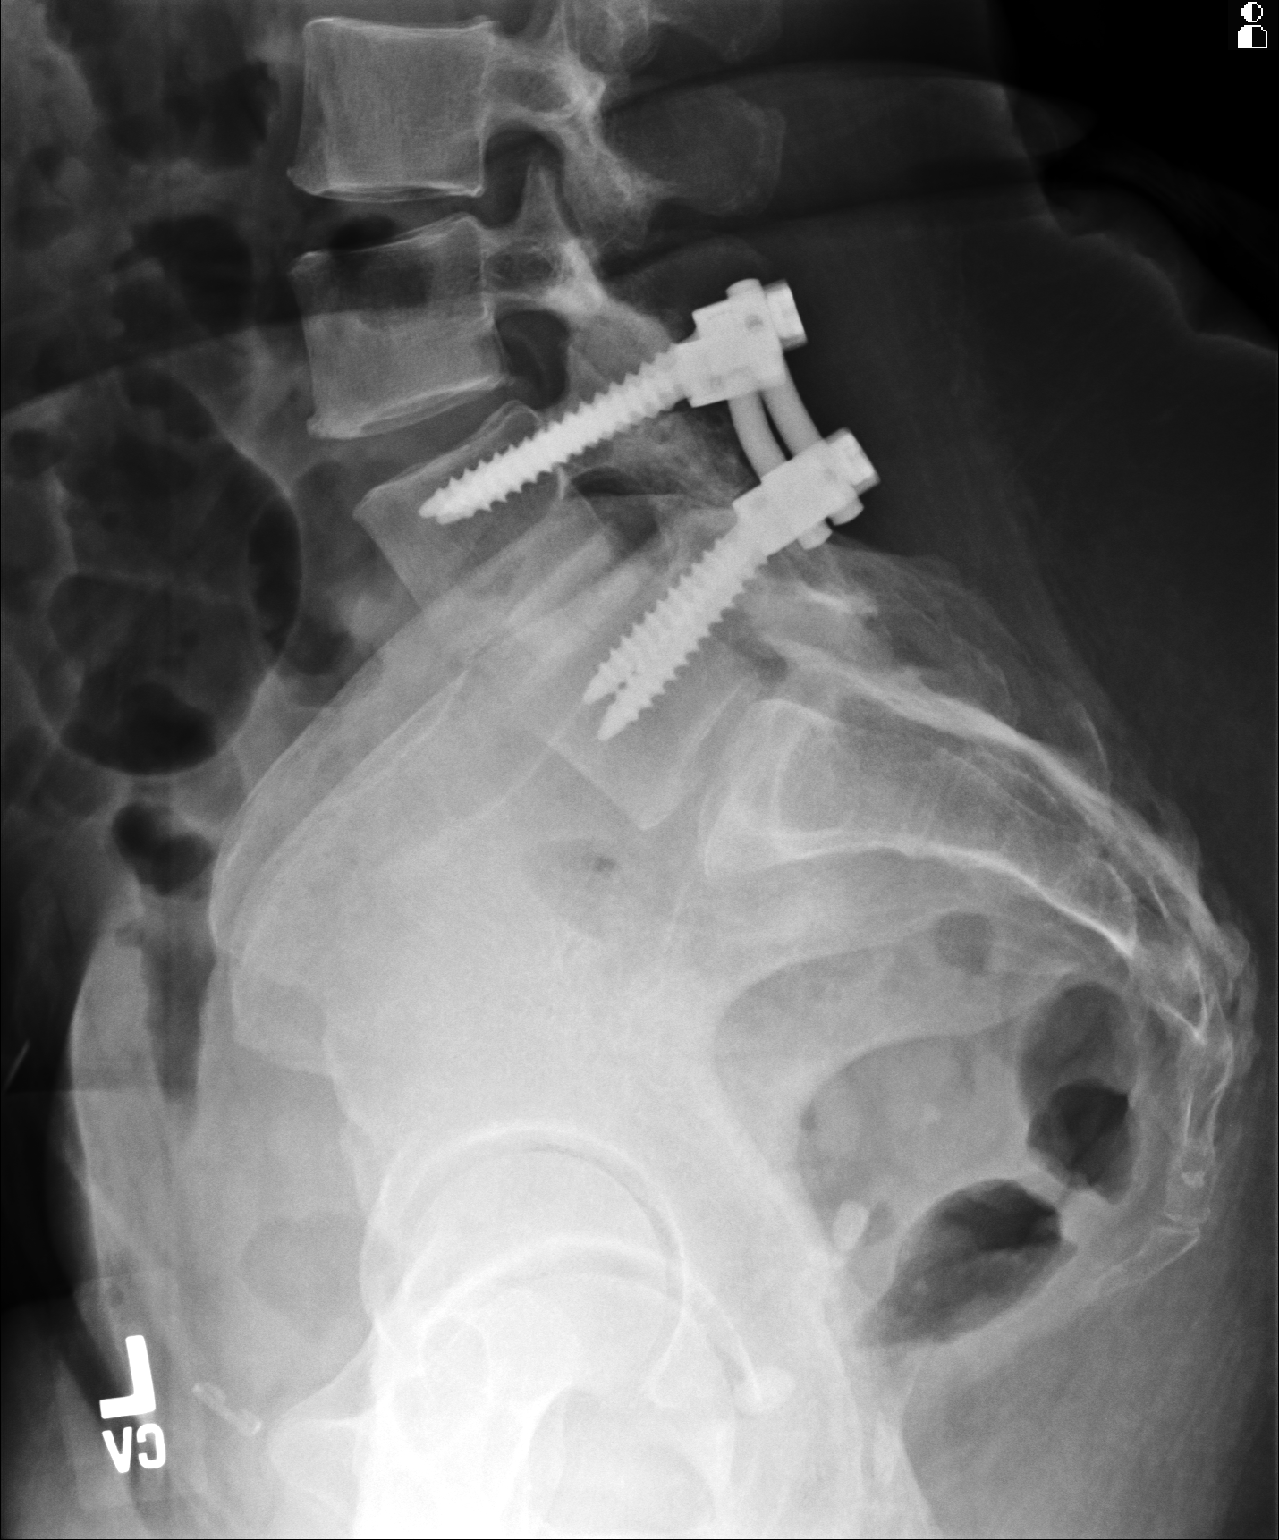

[3 of 3 positions shown; findings below may reference images not displayed]

PROCEDURE:     [facility]DR - KDXR LUMBAR SPINE AP AND LATERAL  - March 16, 2013 [DATE]

RESULT:     Cholecystectomy clips are present. Pedicle screws are present at
L4 and L5 with stabilization rods posteriorly bilaterally. Alignment is
preserved. Intravertebral spacer is seen at L4-L5. Scattered atherosclerotic
calcification is present.
IMPRESSION: 1. No acute bony abnormality. Postoperative changes as described.

[REDACTED]

## 2015-04-09 NOTE — Consult Note (Signed)
PATIENT NAME:  MERIS, Julie MR#:  [id number] DATE OF BIRTH:  1951-02-04  DATE OF CONSULTATION:  01/18/2014  REFERRING PHYSICIAN:  Dr. Bridgett Larsson CONSULTING PHYSICIAN:  Martino Tompson D. Clayborn Bigness, MD  PRIMARY CARE PHYSICIAN: Miguel Aschoff, MD  INDICATION: Syncope.   HISTORY OF PRESENT ILLNESS: Julie Acevedo is a 64 year old African American female with known coronary artery disease, hypertension, diabetes, previous myocardial infarction, and CVA who presented to the ED with syncopal episode. The patient is now awake and alert and feels fine. She was slightly confused after the episode. She had been feeling weak and had some diarrhea for the last couple of days. Came for generalized weakness but noted to have syncopal episode reportedly in the parking lot and brought to the ER for further evaluation. The patient was noted to have another syncopal episode over in CAT scan. She has reportedly had severe bradycardia at the time, but there are no strips documenting that. She has had general weakness and diarrhea, felt lightheaded. Had noticed some palpitations, minimal shortness of breath. No real chest pain, except for the palpitations.   PAST MEDICAL HISTORY: Coronary artery disease, myocardial infarction, hypertension, diabetes, obesity, CVA, diverticulitis.   PAST SURGICAL HISTORY: Gastric bypass, hysterectomy, shoulder surgery, carpal tunnel, back surgery, colon surgery for diverticulitis, appendectomy, cholecystectomy.   FAMILY HISTORY: Hypertension, diabetes, heart attack, stroke.   SOCIAL HISTORY: No smoking. No alcohol consumption.   ALLERGIES: LOTREL, NIACIN, SULFA.  MEDICATIONS: Ultram 50 mg every 8 hours p.r.n., Norvasc 10 twice a day, metformin 850 twice a day, ibuprofen p.r.n.   REVIEW OF SYSTEMS: Mild weakness, fatigue, slight lightheadedness. Otherwise, no blackout spells or syncope. No nausea or vomiting. No fever, no chills, no sweats,. no weight loss, and no weight gain. No hemoptysis. No  hematemesis. Denies bright red blood per rectum. No vision change. No hearing change. Denies sputum production and cough.   PHYSICAL EXAMINATION: VITAL SIGNS: Blood pressure initially was high at 180/80, pulse 90, respiratory rate 14, afebrile.  HEENT: Normocephalic, atraumatic. Pupils equal and reactive to light.  NECK: Supple. No JVD, bruits, or adenopathy.  LUNGS: Clear to auscultation and percussion. No significant wheeze, rhonchi, or rale.  HEART: Regular rate and rhythm. No significant murmur, gallops, or rubs.  ABDOMEN: Benign.  EXTREMITIES: Within normal limits.  NEUROLOGIC: Intact.  SKIN: Normal.   LABORATORY AND DIAGNOSTICS: Glucose 114, BUN 16, creatinine 0.89, sodium 140, potassium 2.6, chloride 110. Magnesium 2. Alcohol level was low. White count 11.8 and platelet count 349.   Chest x-ray was negative. CT of the head was negative.   EKG: Sinus rhythm at about 100, nonspecific findings.   ASSESSMENT: 1.  Reportedly vasovagal bradycardic episode. 2.  Vasovagal syncope.  3.  Hypokalemia. 4.  Possible slight dehydration.  5.  Diarrhea. 6.  Hypertension by history.  7.  Diabetes.  8.  History of cerebrovascular accident. 9.  Coronary artery disease.   PLAN:  1.  Continue current medication. The patient appears to be stable right now. Will treat the patient medically. Telemetry does not show any significant severe bradycardia. I have seen rates in the 60s, but not lower. No ST segment changes. No significant tachycardic episodes. Would probably recommend Holter monitor for 24 to 48 hours to evaluate for tachycardia as well as severe bradycardic episodes of pauses.  2.  I agree with echocardiogram, which shows minimal to no significant valvular disease.  3.  Recommend correct the electrolytes, especially hypokalemia.  4.  Treat diarrhea.  5.  Continue diabetes  management.  6.  Continue hypertension therapy with Norvasc or ACE inhibitor.  7.  It is okay to discharge the  patient and have the patient follow up for further evaluation as an outpatient including Holter monitor and possibly a Myoview to evaluate whether she has tachybrady syndrome and if she is a candidate for a permanent pacemaker or not. I believe this is probably vasovagal in nature or related to volume depletion from diarrhea or from electrolyte abnormalities. Again, I would treat the patient conservatively. She is probably safe to go home today and follow up as an outpatient. ____________________________ Loran Senters. Clayborn Bigness, MD ddc:sb D: 01/18/2014 15:24:34 ET T: 01/18/2014 15:47:51 ET JOB#: [id number]  cc: Kien Mirsky D. Clayborn Bigness, MD, <Dictator> Yolonda Kida MD ELECTRONICALLY SIGNED 03/01/2014 7:48

## 2015-04-09 NOTE — Discharge Summary (Signed)
PATIENT NAME:  Julie Acevedo, Julie Acevedo MR#:  [id number] DATE OF BIRTH:  [birth year]  DATE OF ADMISSION:  01/17/2014 DATE OF DISCHARGE:  01/18/2014  For a detailed note, please take a look at the history and physical done on admission by Dr. Bridgett Larsson.   DIAGNOSES AT DISCHARGE: 1. Syncope, likely vasovagal in nature.  2. Hypokalemia.  3. Hypertension.  4. Diabetes.   DIET: The patient is being discharged on a low-sodium carb -controlled diet.   ACTIVITY: As tolerated.   FOLLOW-UP: Dr. Miguel Aschoff 1 to 2 weeks.   DISCHARGE MEDICATIONS: Metformin 5 mg b.i.d., Norvasc 10 mg daily, HCTZ 25 mg daily.   [place] COURSE: Dr. Lujean Amel from cardiology.   PERTINENT STUDIES DONE DURING THE HOSPITAL COURSE: A CT scan of the head done without contrast on admission showing no acute abnormality. An ultrasound of the carotids showing no evidence of any hemodynamically significant carotid artery stenosis. A 2-dimensional echocardiogram, also done showing ejection fraction of 60%, normal global LV function.   HOSPITAL COURSE: This is a 64 year old female with medical problems as mentioned above, presented to the hospital with a syncopal episode.  1. Syncope. The most likely cause of the patient's syncope is vasovagal in nature. The patient was observed overnight on telemetry, had three sets of cardiac markers checked, which were negative. She had no evidence of acute cardiac arrhythmias although it was thought that she had bradycardic episode before she came to the hospital, although she had no further episodes while here on telemetry. Her CT head was negative. Her carotid duplex was negative. A 2-dimensional echocardiogram showed no evidence of any LV dysfunction. The patient was seen by cardiology, and they did not think that the patient had any evidence of acute cardiogenic syncope. They would prefer getting a Holter monitor as an outpatient. She also had orthostatic vital signs checked,  which were negative.  2. Diabetes. The patient's metformin was held in the hospital. She had no hypoglycemic episodes. She will continue that upon discharge.  3. Hypertension. The patient remained hemodynamically stable without any evidence of orthostasis she will resume her HCTZ upon discharge.  4. Hypokalemia. This was supplemented and since then has improved and now resolved.   The patient is a FULL CODE.   DISPOSITION: She is being discharged home.   TIME SPENT: 40 minutes.   ____________________________ Belia Heman. Verdell Carmine, MD vjs:sg D: 01/18/2014 16:45:51 ET T: 01/19/2014 06:14:48 ET JOB#: [id number]  cc: Belia Heman. Verdell Carmine, MD, <Dictator> Richard L. Rosanna Randy, MD Dwayne D. Clayborn Bigness, MD  Henreitta Leber MD ELECTRONICALLY SIGNED 01/21/2014 11:49

## 2015-04-09 NOTE — H&P (Signed)
PATIENT NAME:  Julie Acevedo, Julie Acevedo MR#:  [id number] DATE OF BIRTH:  13-Mar-1951  DATE OF ADMISSION:  01/17/2014  PRIMARY CARE PHYSICIAN: Dr. Rosanna Randy.   REFERRING PHYSICIAN: Dr. Jasmine December.   CHIEF COMPLAINT: Syncope tonight.   HISTORY OF PRESENT ILLNESS: A 64 year old [place] American female with a history of CAD, hypertension, diabetes, MI, CVA, presented to the ED with syncopal episode tonight. The patient is alert, awake, mildly confused. She said she has been feeling weak and had diarrhea for the past 3 days. She came here for generalized weakness, but she was noted to have a syncope episode in the parking lot in the hospital and then brought to ED for further evaluation. The patient was noted to another syncope episode during CAT scan. She was noted to have severe bradycardia to 18. The patient only complains of generalized weakness and diarrhea. Denies any headache or dizziness. No chest pain, palpitation, orthopnea, nocturnal dyspnea. No leg edema. Denies any cough, fever or chills.   PAST MEDICAL HISTORY: CAD, MI, hypertension, diabetes, obesity, history of CVA, previous diverticulitis, status post colon surgery.   SURGICAL HISTORY:  1. Gastric bypass.  2. Hysterectomy with right oophorectomy and tubal surgery.  3. Left shoulder surgery.  4. Carpal tunnel surgery.  5. Back surgery.  6. Colon surgery for diverticulitis.  7. Appendectomy.  8. Cholecystectomy.   SOCIAL HISTORY: No smoking or drinking or illicit drugs.   FAMILY HISTORY: Hypertension, diabetes, heart attack and stroke.   ALLERGIES: LOTREL, NIACIN, SULFA.   HOME MEDICATIONS:  1. Ultram 50 mg p.o. q.4 hours p.r.n.  2. Norvasc 10 mg p.o. b.i.d.  3. Metformin 850 mg p.o. b.i.d.  4. Lantus 40 units daily.  5. Labetalol 300 mg p.o. b.i.d.  6. Ibuprofen 800 mg p.o. t.i.d.  7. Cephalexin 500 mg p.o. b.i.d.   REVIEW OF SYSTEMS:  CONSTITUTIONAL: The patient denies any fever or chills. No headache or dizziness but has  generalized weakness.  EYES: No double vision or blurry vision.   EARS, NOSE, THROAT: No postnasal drip, slurred speech or dysphagia.  CARDIOVASCULAR: No chest pain, palpitation, orthopnea, nocturnal dyspnea. No leg edema.  PULMONARY: No cough, sputum, shortness of breath or hemoptysis.  GASTROINTESTINAL: No abdominal pain, nausea, vomiting, but has diarrhea.  No melena or bloody stool.  GENITOURINARY: No dysuria, hematuria or incontinence.  SKIN: No rash or jaundice.  NEUROLOGY: Positive for syncope episode and loss of consciousness. No seizure.  HEMATOLOGY: No easy bruising or bleeding.  ENDOCRINE: No polyuria, polydipsia, heat or cold intolerance.   PHYSICAL EXAMINATION:  VITAL SIGNS: Temperature 98, blood pressure 184/84, pulse 96, O2 saturation 100%.  GENERAL: The patient is alert, awake, oriented, mildly confused, in no acute distress.  HEENT: Pupils round, equal and reactive to light and accommodation.  NECK: Supple. No JVD or carotid bruit. No lymphadenopathy. No thyromegaly.  CARDIOVASCULAR: S1, S2, regular rate and rhythm. No murmurs or gallops.  PULMONARY: Bilateral air entry. No wheezing or rales. No use of accessory muscles to breathe.  ABDOMEN: Soft. No distention or tenderness. No organomegaly. Bowel sounds present.  EXTREMITIES: No edema, clubbing or cyanosis. No calf tenderness. Bilateral pedal pulses present.  SKIN: No rash or jaundice.  NEUROLOGIC: A and O x 3, mild confusion, follows commands. No focal deficit. Power 3 out of 5. Sensation intact.   LABORATORY DATA: Glucose 114, BUN 16, creatinine 0.89, sodium 140, potassium 2.6, chloride 110. Magnesium 2.0. Uric acid 3.1. Ethanol level less than 3. WBC 11.8, hemoglobin 12, platelets 349.  Chest x-ray today is negative exam. CAT scan of head negative. EKG showed sinus tachycardia at 101 bpm.   IMPRESSIONS:  1. Severe bradycardia episode.  2. Syncope, possibly due to bradycardia.  3. Hypokalemia.  4. Diarrhea.  5.  Hypertension.  6. Diabetes.  7. History of cerebrovascular accident,  8. Coronary artery disease.   PLAN OF TREATMENT:  1. The patient will be admitted to telemetry floor. Will give potassium supplement both IV and p.o. and follow up potassium level.  2. For severe bradycardia episode, we will hold Norvasc and labetalol. We will get a carotid duplex, echocardiograph and a cardiology consult.  3. For diarrhea, we will start on normal saline and check CDT, stool culture and start contact isolation.  4. For diabetes, we will start sliding scale. Hold metformin but start Levemir.   5. I discussed the patient's condition and plan of treatment with the patient and the patient's husband. The patient wants FULL CODE.   TIME SPENT: About 50 minutes.    ____________________________ Demetrios Loll, MD qc:gb D: 01/17/2014 21:50:16 ET T: 01/18/2014 00:41:47 ET JOB#: [id number]  cc: Demetrios Loll, MD, <Dictator> Demetrios Loll MD ELECTRONICALLY SIGNED 01/19/2014 14:13

## 2015-04-09 NOTE — Consult Note (Signed)
Brief Consult Note: Diagnosis: Syncope/Brady/Palpition.   Patient was seen by consultant.   Consult note dictated.   Recommend further assessment or treatment.   Orders entered.   Discussed with Attending MD.   Comments: IMP Syncope Bradycardia Palpition HTN Chronic Back pain Obesity DM Hx CVA Hx CAD HypoK+ . PLAN ASA Holter 24hrs Agree with ECHO Consider Myoview as outpt Avoid Labetolol Continue DM meds Agree with Bp control Ok to d/c home today F/U cardiology as outpt 1-2 weeks.  Electronic Signatures: Lujean Amel D (MD)  (Signed 02-Feb-15 13:18)  Authored: Brief Consult Note   Last Updated: 02-Feb-15 13:18 by Lujean Amel D (MD)

## 2015-04-11 LAB — SURGICAL PATHOLOGY

## 2015-04-17 NOTE — Op Note (Signed)
PATIENT NAME:  Julie Acevedo, Julie Acevedo MR#:  [id number] DATE OF BIRTH:  [birth year]  DATE OF PROCEDURE:  03/21/2015  PREOPERATIVE DIAGNOSIS: Bladder tumor.   POSTOPERATIVE DIAGNOSIS: Bladder tumor, right very mild hydroureteronephrosis.   PROCEDURE PERFORMED:  Cystoscopy, transurethral resection of bladder tumor (small, less than 2 cm), bilateral retrograde pyelogram, right diagnostic ureteroscopy.   ATTENDING SURGEON: Sherlynn Stalls, M.D.   ANESTHESIA: General.  ESTIMATED BLOOD LOSS:  Minimal.  DRAINS: None.   SPECIMENS: Bladder tumor.   COMPLICATIONS: None.   INDICATION FOR PROCEDURE:  This is a 64 year old female who previously underwent an OB/GYN procedure with Dr. Kenton Kingfisher at which time was found to have a small bladder mass intraoperatively. She presents today for treatment of this tumor as well as upper tract imaging. Risks and benefits of the procedure were explained in detail. The patient agreed to proceed with planned procedure.   DESCRIPTION OF PROCEDURE:  The patient was correctly identified in the preoperative holding area and informed consent was confirmed. She was brought to the operating suite and placed on the table in the supine position. At this time, a universal timeout protocol was performed. All team members were identified. Venodyne boots were placed and she was administered 500 mg of IV Levaquin in the perioperative period. She was then placed under general anesthesia and prepped and draped in the standard surgical fashion.  After being repositioned in the dorsal lithotomy position, a 93 French rigid cystoscope was then advanced per urethra into the bladder and a formal cystoscopy was performed. This revealed mild descent of the bladder consistent with a cystocele.  On the posterior left bladder wall, a papillary approximately 1 cm pedunculated tumor was identified on a very thin stalk. There was no surrounding erythema. The remainder of the bladder was carefully surveyed and  there was no evidence of additional tumors. The trigone did have some mild trigonitis, but no evidence of pathology. At this point, a bilateral retrograde pyelogram was performed starting on the left hand side. The left UO was cannulated using a 5 [place] open-ended ureteral catheter.  Contrast was injected. This revealed a normal caliber ureter and collecting system without any filling defects. Attention was then turned to the right ureteral orifice and the same procedure was then performed.  A 5 French open-ended ureteral catheter was advanced just within the UO and a retrograde pyelogram was performed. This revealed a decompressed distal ureter, but at the level of the mid distal ureter there was a transition point with some mild proximal dilation of the ureter and some fullness of the right collecting system.  There were no filling defects within the collecting system or ureter.  This transition point was near the level radiographically of the distal aspect of some spinal hardware and likely it was related to some extrinsic compression or adhesion versus ureteral stricture.  Given the finding in the setting of bladder cancer, I did elect to go ahead and proceed with right diagnostic ureteroscopy.  A wire was placed up to the level of the kidney which was snapped in place.  First, a 79 French flexible ureteroscope was attempted to be advanced through the UO; however, resistance was met at the UO. I then elected to use a 5 French semirigid ureteroscope which could be advanced up to this level of transition point.  There was noted to be some mild concentric narrowing and the 5 [place] scope did not pass easily beyond this area, but it was obvious that this area was patent  and beyond this area could be directly visualized.  It appeared somewhat dilated, but there was no evidence of tumor or obstructing lesions. This area is most likely consistent with extrinsic scarring of the ureter and no evidence of pathology or  malignancy. The scope was then removed as well as the wire.  There is no indication for stent identified as there was very minimal ureteral trauma. Attention was then turned to the bladder tumor.  Cold cup biopsy forceps were used to plug off the tumor in 1 piece down to the level of detrusor fibers which were identified.  This was passed off as the bladder tumor specimen. Bovie electrocautery was used to cauterize the area down to the base of the bladder and adequate hemostasis was achieved.  The bladder was then emptied.  There was no active bleeding noted. The scope was removed.  At this point in time, the procedure was deemed complete. The patient was returned to the supine position, reversed from anesthesia, and taken to the PACU in stable condition. There were no complications in this case.   ____________________________ Sherlynn Stalls, MD ajb:sp D: 03/21/2015 10:21:01 ET T: 03/21/2015 10:48:54 ET JOB#: [id number]  cc: Sherlynn Stalls, MD, <Dictator> Sherlynn Stalls MD ELECTRONICALLY SIGNED 03/29/2015 18:12

## 2015-04-17 NOTE — Op Note (Signed)
PATIENT NAME:  Julie Acevedo, Julie Acevedo MR#:  [id number] DATE OF BIRTH:  Feb 07, 1951  DATE OF PROCEDURE:  02/10/2015  PREOPERATIVE DIAGNOSES: Cystocele, rectocele, pelvic floor weakening.   POSTOPERATIVE DIAGNOSES: Cystocele, rectocele, pelvic floor weakening.   PROCEDURES PERFORMED: Anterior and posterior colporrhaphy, perineorrhaphy and cystoscopy.   SURGEON: Barnett Applebaum, M.D.   ANESTHESIA: General.   ESTIMATED BLOOD LOSS: Minimal.   COMPLICATIONS: None.   FINDINGS: Grade 3 cystocele, grade 2 rectocele.   DISPOSITION: To the recovery room in stable condition.   TECHNIQUE: The patient is prepped and draped in the usual sterile fashion after adequate anesthesia is obtained in the dorsal lithotomy position. A Foley catheter is inserted. Vaginal exam reveals the above-mentioned findings. A weighted speculum is placed and the anterior vaginal wall is identified and Allis clamps are placed along the midline from 1 cm inferior to the urethra to the vaginal apex. The midline is infiltrated with 1% lidocaine with epinephrine and then incised using Metzenbaum scissors. The endopelvic fascia is dissected away from the vaginal mucosa. Once adequate dissection is performed, plication sutures using 0 Vicryl suture is then used to pull the endopelvic fascia to the midline. Once adequate plication sutures are placed, excess vaginal mucosa is excised. The vaginal mucosa is closed with a running locking 2-0 Vicryl suture to the level of the apex and then held in place until continued use for the posterior repair.   Weighted speculum is removed and the posterior vaginal wall is identified with Allis clamps placed along the midline up to the vaginal apex. A perineorrhaphy is performed with excision of the excess vaginal tissue at the perineum. Metzenbaum scissors are then used to dissect the midline incision. The endopelvic fascia is dissected away from the vaginal mucosa. Once adequate dissection is performed, the  endopelvic fascia is plicated using 0 Vicryl sutures in an interrupted fashion. Once adequate plication is performed, excess vaginal mucosa is excised. Additional suture is placed at the perineum to pull the endopelvic fascia together to tighten the vaginal orifice at the introitus. Using the similar 2-0 Vicryl suture from the anterior side it is then continued in a running locking fashion through the posterior vaginal mucosa to the level of the introitus. It is continued down externally and then subcuticular placement of the suture back to the introitus where the suture is tied for complete closure.   A vaginal packing sponge is placed with Premarin cream on the vaginal packing sponge. A cystoscopy is performed with saline distention of the bladder with no injuries or sutures noted within the bladder. Pictures are obtained. There is 1 small lesion within the bladder of uncertain etiology that will be shared with urology at a later time.   Cystoscope is removed and Foley catheter is replaced. The patient goes to the recovery room in stable condition tolerating the procedure well. All sponge, instrument, and needle counts are correct.   ____________________________ R. Barnett Applebaum, MD rph:sb D: 02/10/2015 09:18:43 ET T: 02/10/2015 16:49:10 ET JOB#: [id number]  cc: Glean Salen, MD, <Dictator> Gae Dry MD ELECTRONICALLY SIGNED 02/11/2015 8:08

## 2015-06-03 ENCOUNTER — Telehealth: Payer: Self-pay | Admitting: Family Medicine

## 2015-06-03 ENCOUNTER — Other Ambulatory Visit: Payer: Self-pay

## 2015-06-03 DIAGNOSIS — E119 Type 2 diabetes mellitus without complications: Secondary | ICD-10-CM

## 2015-06-03 DIAGNOSIS — I10 Essential (primary) hypertension: Secondary | ICD-10-CM

## 2015-06-03 MED ORDER — HYDROCHLOROTHIAZIDE 25 MG PO TABS
25.0000 mg | ORAL_TABLET | Freq: Every day | ORAL | Status: DC
Start: 2015-06-03 — End: 2015-11-02

## 2015-06-03 MED ORDER — METFORMIN HCL 500 MG PO TABS: 500.0000 mg | ORAL_TABLET | Freq: Two times a day (BID) | ORAL | Status: DC

## 2015-06-03 MED ORDER — AMLODIPINE BESYLATE 10 MG PO TABS: 10.0000 mg | ORAL_TABLET | Freq: Every day | ORAL | Status: DC

## 2015-06-03 NOTE — Telephone Encounter (Signed)
Pt contacted office for refill request on the following medications:  metFORMIN (GLUCOPHAGE) 500 MG tablet, amLODipine (NORVASC) 10 MG tablet and  hydrochlorothiazide (HYDRODIURIL) 25 MG tablet. [facility].  TU#[phone]/PR

## 2015-06-11 DIAGNOSIS — G4700 Insomnia, unspecified: Secondary | ICD-10-CM | POA: Insufficient documentation

## 2015-06-11 DIAGNOSIS — M179 Osteoarthritis of knee, unspecified: Secondary | ICD-10-CM | POA: Insufficient documentation

## 2015-06-11 DIAGNOSIS — M549 Dorsalgia, unspecified: Secondary | ICD-10-CM

## 2015-06-11 DIAGNOSIS — E785 Hyperlipidemia, unspecified: Secondary | ICD-10-CM | POA: Insufficient documentation

## 2015-06-11 DIAGNOSIS — M5417 Radiculopathy, lumbosacral region: Secondary | ICD-10-CM | POA: Insufficient documentation

## 2015-06-11 DIAGNOSIS — E538 Deficiency of other specified B group vitamins: Secondary | ICD-10-CM | POA: Insufficient documentation

## 2015-06-11 DIAGNOSIS — G8929 Other chronic pain: Secondary | ICD-10-CM | POA: Insufficient documentation

## 2015-06-11 DIAGNOSIS — E876 Hypokalemia: Secondary | ICD-10-CM | POA: Insufficient documentation

## 2015-06-11 DIAGNOSIS — M545 Low back pain, unspecified: Secondary | ICD-10-CM | POA: Insufficient documentation

## 2015-06-11 DIAGNOSIS — M1710 Unilateral primary osteoarthritis, unspecified knee: Secondary | ICD-10-CM | POA: Insufficient documentation

## 2015-06-11 DIAGNOSIS — M1990 Unspecified osteoarthritis, unspecified site: Secondary | ICD-10-CM | POA: Insufficient documentation

## 2015-06-11 HISTORY — DX: Radiculopathy, lumbosacral region: M54.17

## 2015-06-27 ENCOUNTER — Ambulatory Visit: Payer: Self-pay | Admitting: Family Medicine

## 2015-06-28 ENCOUNTER — Ambulatory Visit (INDEPENDENT_AMBULATORY_CARE_PROVIDER_SITE_OTHER): Payer: Medicare PPO | Admitting: Urology

## 2015-06-28 VITALS — BP 111/75 | HR 77 | Ht 62.0 in | Wt 169.0 lb

## 2015-06-28 DIAGNOSIS — N329 Bladder disorder, unspecified: Secondary | ICD-10-CM | POA: Diagnosis not present

## 2015-06-28 LAB — URINALYSIS, COMPLETE
Bilirubin, UA: NEGATIVE
Glucose, UA: NEGATIVE
Ketones, UA: NEGATIVE
Leukocytes, UA: NEGATIVE
Nitrite, UA: NEGATIVE
Protein, UA: NEGATIVE
RBC, UA: NEGATIVE
Specific Gravity, UA: 1.025 (ref 1.005–1.030)
Urobilinogen, Ur: 1 mg/dL (ref 0.2–1.0)
pH, UA: 5.5 (ref 5.0–7.5)

## 2015-06-28 LAB — MICROSCOPIC EXAMINATION: Bacteria, UA: NONE SEEN

## 2015-06-28 MED ORDER — CIPROFLOXACIN HCL 500 MG PO TABS
500.0000 mg | ORAL_TABLET | Freq: Once | ORAL | Status: AC
  Administered 2015-06-28: 500 mg via ORAL

## 2015-06-28 MED ORDER — LIDOCAINE HCL 2 % EX GEL
1.0000 "application " | Freq: Once | CUTANEOUS | Status: AC
  Administered 2015-06-28: 1 via URETHRAL

## 2015-06-28 NOTE — Progress Notes (Signed)
06/28/2015 11:29 AM   Festus Holts Raymond Gurney 1951-08-28 [id number]  Referring provider: Jerrol Banana., MD [address] [place], [place] [address]  Chief Complaint  Patient presents with  . Follow-up    cystoscopy, TURBT/ bilateral cyst retrograde. benign pathology but it is important to follow at least for a few consecutive cystoscopies to insure that it does not recur.    HPI: 64 year old female who was incidentally found to have a bladder mass on cystoscopy during a procedure (anterior-posterior repair by Dr. Kenton Kingfisher). She was taken to the operating room for TURBT on 03/2015, bilateral retrograde pyelogram. This revealed a papillary lesion just beyond the LEFT UO which was removed using cold cup biopsy forceps and the bases fulgurated. Bilateral retrograde pyelograms were negative. 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   She returns today for office cystoscopy for surveillance in the setting of somewhat unusual pathology although benign.   No issues since her last surgery. Her prolapse repair was excellent and she is quite pleased.    She denies any urinary symptoms including dysuria, hematuria, or any other problems. No flank pain.    PMH: Past Medical History  Diagnosis Date  . DM (diabetes mellitus)   . HTN (hypertension)   . Diverticulitis   . Arthritis   . Coronary artery disease   . Stroke   . Short-term memory loss   . Myocardial infarction     Surgical History: Past Surgical History  Procedure Laterality Date  . Hemicolectomy    . Lumbar disc surgery    . Roux-en-y gastric bypass    . Cholecystectomy    . Eye surgery    . Hand surgery    . Foot surgery    . Abdominal hysterectomy      due to endometriosis-1 ovary left  . Tonsillectomy      Home Medications:    Medication List       This list is accurate as of: 06/28/15 11:29 AM.  Always use your most recent med list.               amLODipine 10 MG tablet  Commonly known as:  NORVASC  Take 1 tablet (10 mg total) by mouth daily.     aspirin 325 MG EC tablet  Take 1 tablet (325 mg total) by mouth daily.     BAYER CONTOUR TEST test strip  Generic drug:  glucose blood  BAYER CONTOUR TEST (In Vitro Strip)  1 (one) Strip Strip check sugar once daily for 0 days  Quantity: 50;  Refills: 12   Ordered :14-Oct-2014  Althea Charon ;  Started 14-Oct-2014 Active Comments: strips and lancets DX: E11.9     hydrochlorothiazide 25 MG tablet  Commonly known as:  HYDRODIURIL  Take 1 tablet (25 mg total) by mouth daily.     HYDROcodone-acetaminophen 10-325 MG per tablet  Commonly known as:  NORCO  Take by mouth.     metFORMIN 500 MG tablet  Commonly known as:  GLUCOPHAGE  Take 1 tablet (500 mg total) by mouth 2 (two) times daily with a meal.     MULTIVITAMIN & MINERAL Liqd  Take by mouth.     potassium chloride SA 20 MEQ tablet  Commonly known as:  K-DUR,KLOR-CON  Take 1 tablet (20 mEq total) by mouth 2 (two) times daily.     predniSONE 10 MG tablet  Commonly known as:  DELTASONE     temazepam 30 MG  capsule  Commonly known as:  RESTORIL  Take by mouth.        Allergies:  Allergies  Allergen Reactions  . Lotrel [Amlodipine Besy-Benazepril Hcl] Anaphylaxis  . Contrast Media  [Iodinated Diagnostic Agents]     neck swelling  . Niacin     discharge  . Sulfa Antibiotics Hives    Family History: Family History  Problem Relation Age of Onset  . Cancer Father 46    Lung  . Coronary artery disease Father 18  . COPD Mother   . Diabetes Mother   . Hypertension Mother   . Cancer Paternal Aunt     Breast  . Cancer Paternal Grandmother     Breast  . Congestive Heart Failure Maternal Grandmother   . Emphysema Maternal Grandfather   . Heart attack Paternal Grandfather     Social History:  reports that she has never smoked. She has never used smokeless tobacco. She reports that she does not drink alcohol or use  illicit drugs.  Physical Exam: BP 111/75 mmHg  Pulse 77  Ht 5\' 2"  (1.575 m)  Wt 169 lb (76.658 kg)  BMI 30.90 kg/m2  Constitutional:  Alert and oriented, No acute distress. HEENT: [place] AT, moist mucus membranes.  Trachea midline, no masses. Cardiovascular: No clubbing, cyanosis, or edema. Respiratory: Normal respiratory effort, no increased work of breathing. GI: Abdomen is soft, nontender, nondistended, no abdominal masses GU: No CVA tenderness. Normal external genitalia and urethral meatus. Skin: No rashes, bruises or suspicious lesions. Neurologic: Grossly intact, no focal deficits, moving all 4 extremities. Psychiatric: Normal mood and affect.   Laboratory Data: Lab Results  Component Value Date   WBC 6.9 10/04/2014   HGB 13.3 10/04/2014   HCT 39.0 10/04/2014   MCV 82.9 10/04/2014   PLT 398 10/04/2014    Lab Results  Component Value Date   CREATININE 1.00 10/04/2014    Lab Results  Component Value Date   HGBA1C 5.8 09/06/2014    Urinalysis Urine dipstick shows negative for all components.  Micro exam: negative for WBC's or RBC's.  Cystoscopy Procedure Note  Patient identification was confirmed, informed consent was obtained, and patient was prepped using Betadine solution.  Lidocaine jelly was administered per urethral meatus.    Preoperative abx where received prior to procedure.    Procedure: - Flexible cystoscope introduced, without any difficulty.   - Thorough search of the bladder revealed:    normal urethral meatus    normal urothelium evidence of very subtle trigonitis    no stones    no ulcers     no tumors    no urethral polyps    no trabeculation  - Ureteral orifices were normal in position and appearance.  Post-Procedure: - Patient tolerated the procedure well  Assessment & Plan:  64 yo F with incidental inverted papilloma s/p TURBT on 03/2015.  Repeat cystoscopy today was negative for any recurrence. Given that this is a benign lesion, no  further surveillance is indicated. I have advised the patient to return if she develops any urinary symptoms, gross hematuria, or evidence of microscopic hematuria discovered by her PCP.  1. Lesion of bladder - Urinalysis, Complete - ciprofloxacin (CIPRO) tablet 500 mg; Take 1 tablet (500 mg total) by mouth once. - lidocaine (XYLOCAINE) 2 % jelly 1 application; Place 1 application into the urethra once.   Return if symptoms worsen or fail to improve.  Hollice Espy, MD  [facility] [address] [place],  [place] [address] [phone]

## 2015-06-28 NOTE — Progress Notes (Signed)
Consent signed and Ciprofloxican 500MG  given po. Pt prepped with betadine to clean the area and lidocaine jelly inserted in the urethral. Scope # [id number]

## 2015-07-05 ENCOUNTER — Encounter: Payer: Self-pay | Admitting: Family Medicine

## 2015-07-05 ENCOUNTER — Ambulatory Visit (INDEPENDENT_AMBULATORY_CARE_PROVIDER_SITE_OTHER): Payer: Medicare PPO | Admitting: Family Medicine

## 2015-07-05 VITALS — BP 110/70 | HR 84 | Temp 98.0°F | Resp 16 | Wt 163.0 lb

## 2015-07-05 DIAGNOSIS — K5900 Constipation, unspecified: Secondary | ICD-10-CM | POA: Diagnosis not present

## 2015-07-05 DIAGNOSIS — M545 Low back pain: Secondary | ICD-10-CM | POA: Diagnosis not present

## 2015-07-05 DIAGNOSIS — M62830 Muscle spasm of back: Secondary | ICD-10-CM

## 2015-07-05 DIAGNOSIS — E118 Type 2 diabetes mellitus with unspecified complications: Secondary | ICD-10-CM

## 2015-07-05 LAB — POCT GLYCOSYLATED HEMOGLOBIN (HGB A1C): Hemoglobin A1C: 6

## 2015-07-05 MED ORDER — LINACLOTIDE 145 MCG PO CAPS: 145.0000 ug | ORAL_CAPSULE | Freq: Every day | ORAL | Status: DC

## 2015-07-05 MED ORDER — DIAZEPAM 5 MG PO TABS: 5.0000 mg | ORAL_TABLET | Freq: Four times a day (QID) | ORAL | Status: DC | PRN
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
## 2015-07-21 ENCOUNTER — Encounter: Payer: Self-pay | Admitting: Emergency Medicine

## 2015-07-21 ENCOUNTER — Emergency Department: Payer: Medicare PPO

## 2015-07-21 ENCOUNTER — Other Ambulatory Visit: Payer: Self-pay

## 2015-07-21 ENCOUNTER — Emergency Department
Admission: EM | Admit: 2015-07-21 | Discharge: 2015-07-21 | Disposition: A | Discharge status: Home / Self Care | Payer: Medicare PPO | Attending: Emergency Medicine | Admitting: Emergency Medicine

## 2015-07-21 DIAGNOSIS — R55 Syncope and collapse: Secondary | ICD-10-CM | POA: Diagnosis not present

## 2015-07-21 DIAGNOSIS — E119 Type 2 diabetes mellitus without complications: Secondary | ICD-10-CM | POA: Insufficient documentation

## 2015-07-21 DIAGNOSIS — I10 Essential (primary) hypertension: Secondary | ICD-10-CM | POA: Insufficient documentation

## 2015-07-21 DIAGNOSIS — Z79899 Other long term (current) drug therapy: Secondary | ICD-10-CM | POA: Diagnosis not present

## 2015-07-21 DIAGNOSIS — R112 Nausea with vomiting, unspecified: Secondary | ICD-10-CM | POA: Insufficient documentation

## 2015-07-21 DIAGNOSIS — R531 Weakness: Secondary | ICD-10-CM | POA: Diagnosis present

## 2015-07-21 LAB — BASIC METABOLIC PANEL
Anion gap: 9 (ref 5–15)
BUN: 12 mg/dL (ref 6–20)
CO2: 28 mmol/L (ref 22–32)
Calcium: 9.5 mg/dL (ref 8.9–10.3)
Chloride: 104 mmol/L (ref 101–111)
Creatinine, Ser: 0.76 mg/dL (ref 0.44–1.00)
GFR calc Af Amer: >60 mL/min (ref >60)
GFR calc non Af Amer: >60 mL/min (ref >60)
Glucose, Bld: 107 mg/dL — ABNORMAL HIGH (ref 65–99)
Potassium: 3.6 mmol/L (ref 3.5–5.1)
Sodium: 141 mmol/L (ref 135–145)

## 2015-07-21 LAB — URINALYSIS COMPLETE WITH MICROSCOPIC (ARMC ONLY)
Bacteria, UA: NONE SEEN
Bilirubin Urine: NEGATIVE
Glucose, UA: NEGATIVE mg/dL
Hgb urine dipstick: NEGATIVE
Ketones, ur: NEGATIVE mg/dL
Nitrite: NEGATIVE
Protein, ur: NEGATIVE mg/dL
RBC / HPF: NONE SEEN RBC/hpf (ref 0–5)
Specific Gravity, Urine: 1.011 (ref 1.005–1.030)
pH: 7 (ref 5.0–8.0)

## 2015-07-21 LAB — CBC
HCT: 36.1 % (ref 35.0–47.0)
Hemoglobin: 11.8 g/dL — ABNORMAL LOW (ref 12.0–16.0)
MCH: 26.6 pg (ref 26.0–34.0)
MCHC: 32.8 g/dL (ref 32.0–36.0)
MCV: 81.1 fL (ref 80.0–100.0)
Platelets: 314 10*3/uL (ref 150–440)
RBC: 4.45 MIL/uL (ref 3.80–5.20)
RDW: 15.7 % — ABNORMAL HIGH (ref 11.5–14.5)
WBC: 9.3 10*3/uL (ref 3.6–11.0)

## 2015-07-21 LAB — TROPONIN I: Troponin I: <0.03 ng/mL (ref <0.031)

## 2015-07-21 MED ORDER — ONDANSETRON 4 MG PO TBDP
4.0000 mg | ORAL_TABLET | Freq: Once | ORAL | Status: AC
  Administered 2015-07-21: 4 mg via ORAL
  Filled 2015-07-21: qty 1

## 2015-07-21 NOTE — ED Provider Notes (Signed)
[facility] Emergency Department Provider Note   ____________________________________________  Time seen: 3:50 p.m. I have reviewed the triage vital signs and the triage nursing note.  HISTORY  Chief Complaint Loss of Consciousness and Weakness   Historian Patient, and her 2 daughters  HPI Julie Acevedo is a 64 y.o. female who lives at home with her daughter and has a history of diabetes for which she takes metformin, as well as a history of stroke in 2005 which gave her short-term memory loss, who today states that this morning she woke up feeling "sick" nauseated, dizzy, and lightheaded. She was trying to work and she developed more significant nausea and then had a large amount of nonbloody nonbilious emesis. No history of fevers. Daughter in the same household has had a cold but no GI symptoms. Patient has had no diarrhea. Reports mild history of constipation. She is complaining that she is has had trouble with memory and the feeling of dizziness especially when she stands up for a while now. No room spinning. No trauma history. Per EMS after she had been vomiting when they tried to stand her up for the stretcher she did pass out for a short duration.   Past Medical History  Diagnosis Date  . DM (diabetes mellitus)   . HTN (hypertension)   . Diverticulitis   . Arthritis   . Coronary artery disease   . Stroke   . Short-term memory loss   . Myocardial infarction     Patient Active Problem List   Diagnosis Date Noted  . Arthritis 06/11/2015  . Back pain, chronic 06/11/2015  . HLD (hyperlipidemia) 06/11/2015  . Decreased potassium in the blood 06/11/2015  . Cannot sleep 06/11/2015  . L-S radiculopathy 06/11/2015  . Arthritis of knee, degenerative 06/11/2015  . B12 deficiency 06/11/2015  . 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 Acute encephalopathy 11/15/2012  . Dysphasia 11/15/2012  . DM2  (diabetes mellitus, type 2) 11/15/2012  . DM (diabetes mellitus) 01/27/2012  . Chest pain, localized 01/27/2012  . HTN (hypertension) 01/27/2012  . Adiposity 11/21/2009  . Avitaminosis D 11/07/2009  . Narrowing of intervertebral disc space 07/25/2009  . Diabetes 07/25/2009  . Benign essential HTN 07/25/2009    Past Surgical History  Procedure Laterality Date  . Hemicolectomy    . Lumbar disc surgery    . Roux-en-y gastric bypass    . Cholecystectomy    . Eye surgery    . Hand surgery    . Foot surgery    . Abdominal hysterectomy      due to endometriosis-1 ovary left  . Tonsillectomy      Current Outpatient Rx  Name  Route  Sig  Dispense  Refill  . amLODipine (NORVASC) 10 MG tablet   Oral   Take 1 tablet (10 mg total) by mouth daily.   30 tablet   6   . diazepam (VALIUM) 5 MG tablet   Oral   Take 1 tablet (5 mg total) by mouth every 6 (six) hours as needed for anxiety. 1/2 tab to 1 tab Patient taking differently: Take 2.5-5 mg by mouth every 6 (six) hours as needed for muscle spasms.    30 tablet   1   . hydrochlorothiazide (HYDRODIURIL) 25 MG tablet   Oral   Take 1 tablet (25 mg total) by mouth daily.   30 tablet   6   . HYDROcodone-acetaminophen (NORCO) 10-325 MG  per tablet   Oral   Take 1 tablet by mouth every 6 (six) hours as needed for moderate pain.         Marland Kitchen Linaclotide (LINZESS) 145 MCG CAPS capsule   Oral   Take 1 capsule (145 mcg total) by mouth daily. Given as samples Patient taking differently: Take 145 mcg by mouth daily. Pt takes Friday-Sunday.   28 capsule   0   . metFORMIN (GLUCOPHAGE) 500 MG tablet   Oral   Take 1 tablet (500 mg total) by mouth 2 (two) times daily with a meal.   60 tablet   6   . Prenatal Vit-Fe Fumarate-FA (PRENATAL MULTIVITAMIN) TABS tablet   Oral   Take 1 tablet by mouth daily.         Marland Kitchen aspirin EC 325 MG EC tablet   Oral   Take 1 tablet (325 mg total) by mouth daily. Patient not taking: Reported on  07/21/2015   30 tablet   0   . potassium chloride SA (K-DUR,KLOR-CON) 20 MEQ tablet   Oral   Take 1 tablet (20 mEq total) by mouth 2 (two) times daily. Patient not taking: Reported on 07/21/2015   10 tablet   0     Allergies Lotrel; Contrast media; Niacin; and Sulfa antibiotics  Family History  Problem Relation Age of Onset  . Cancer Father 79    Lung  . Coronary artery disease Father 61  . COPD Mother   . Diabetes Mother   . Hypertension Mother   . Cancer Paternal Aunt     Breast  . Cancer Paternal Grandmother     Breast  . Congestive Heart Failure Maternal Grandmother   . Emphysema Maternal Grandfather   . Heart attack Paternal Grandfather     Social History History  Substance Use Topics  . Smoking status: Never Smoker   . Smokeless tobacco: Never Used  . Alcohol Use: No    Review of Systems  Constitutional: Negative for fever. Eyes: Negative for visual changes. ENT: Negative for sore throat. Cardiovascular: Negative for chest pain. Respiratory: Negative for shortness of breath. Gastrointestinal: Negative for abdominal pain Genitourinary: Negative for dysuria. Musculoskeletal: Negative for back pain. Skin: Negative for rash. Neurological: Negative for headache, focal weakness or numbness. 10 point Review of Systems otherwise negative ____________________________________________   PHYSICAL EXAM:  VITAL SIGNS: ED Triage Vitals  Enc Vitals Group     BP 07/21/15 1542 128/78 mmHg     Pulse Rate 07/21/15 1542 76     Resp 07/21/15 1542 18     Temp 07/21/15 1542 97.5 F (36.4 C)     Temp Source 07/21/15 1542 Oral     SpO2 07/21/15 1542 100 %     Weight 07/21/15 1542 156 lb (70.761 kg)     Height 07/21/15 1542 5\' 3"  (1.6 m)     Head Cir --      Peak Flow --      Pain Score --      Pain Loc --      Pain Edu? --      Excl. in [place]? --      Constitutional: Alert and oriented. Well appearing and in no distress. Eyes: Conjunctivae are normal. PERRL.  Normal extraocular movements. ENT   Head: Normocephalic and atraumatic.   Nose: No congestion/rhinnorhea.   Mouth/Throat: Mucous membranes are moist.   Neck: No stridor. Cardiovascular/Chest: Normal rate, regular rhythm.  No murmurs, rubs, or gallops. Respiratory: Normal respiratory effort  without tachypnea nor retractions. Breath sounds are clear and equal bilaterally. No wheezes/rales/rhonchi. Gastrointestinal: Soft. No distention, no guarding, no rebound. Nontender   Genitourinary/rectal:Deferred Musculoskeletal: Nontender with normal range of motion in all extremities. No joint effusions.  No lower extremity tenderness nor edema. Neurologic:  Normal speech and language. No gross or focal neurologic deficits are appreciated. Skin:  Skin is warm, dry and intact. No rash noted. Psychiatric: Mood and affect are normal. Speech and behavior are normal. Patient exhibits appropriate insight and judgment.  ____________________________________________   EKG I, Lisa Roca, MD, the attending physician have personally viewed and interpreted all ECGs.  79 bpm. Normal sinus rhythm. Narrow dressed. Normal axis. Normal ST and T-wave. QTc 472 ____________________________________________  LABS (pertinent positives/negatives)  Metabolic panel within normal limits White blood count 9.3, hemoglobin 11.8 Troponin less than 0.03 Urinalysis negative  ____________________________________________  RADIOLOGY All Xrays were viewed by me. Imaging interpreted by Radiologist.  CT head noncontrast:  IMPRESSION: 1. No acute intracranial pathology seen on CT. 2. Mild small vessel ischemic microangiopathy. __________________________________________  PROCEDURES  Procedure(s) performed: None Critical Care performed: None  ____________________________________________   ED COURSE / ASSESSMENT AND PLAN  CONSULTATIONS: None  Pertinent labs & imaging results that were available during  my care of the patient were reviewed by me and considered in my medical decision making (see chart for details).   Patient is overall well-appearing with stable vital signs. Somewhat she's been nauseated all day and then at work started vomiting and then during the episode had syncope. She is now feeling better. I suspect the syncope is probably related to vagal during the emesis episode. No diarrhea. No abdominal pain now. Orthostatics are reassuring. Given the fact that the patient is complaining of increased problem with her memory, and being off balance and dizzy, with the syncope, I did obtain a head CT and this showed no acute findings. Patient can follow-up with a primary care physician.  Patient / Family / Caregiver informed of clinical course, medical decision-making process, and agree with plan.   I discussed return precautions, follow-up instructions, and discharged instructions with patient and/or family.  ___________________________________________   FINAL CLINICAL IMPRESSION(S) / ED DIAGNOSES   Final diagnoses:  Non-intractable vomiting with nausea, vomiting of unspecified type  Syncope, unspecified syncope type    FOLLOW UP  Referred to: Primary care physician, one week   Lisa Roca, MD 07/21/15 2102

## 2015-07-21 NOTE — ED Notes (Signed)
Patient to ER from home via EMS for c/o weakness. Patient reports low blood sugar of 66 at home. Upon FD arrival, CBG was 176. EMS CBG was 153. Per EMS, when patient stood up to transfer to stretcher, patient had syncopal episode. Patient appears weak.

## 2015-07-21 NOTE — Discharge Instructions (Signed)
No certain cause was found for your episode of nausea and vomiting today, and episode passing out, however your exam and evaluation are reassuring.  Return to the emergency room for any new or worsening condition including passing out, dizziness, fever, chest pain, trouble breathing, weakness or numbness, or any other symptoms concerning to you.   Nausea and Vomiting Nausea means you feel sick to your stomach. Throwing up (vomiting) is a reflex where stomach contents come out of your mouth. HOME CARE   Take medicine as told by your doctor.  Do not force yourself to eat. However, you do need to drink fluids.  If you feel like eating, eat a normal diet as told by your doctor.  Eat rice, wheat, potatoes, bread, lean meats, yogurt, fruits, and vegetables.  Avoid high-fat foods.  Drink enough fluids to keep your pee (urine) clear or pale yellow.  Ask your doctor how to replace body fluid losses (rehydrate). Signs of body fluid loss (dehydration) include:  Feeling very thirsty.  Dry lips and mouth.  Feeling dizzy.  Dark pee.  Peeing less than normal.  Feeling confused.  Fast breathing or heart rate. GET HELP RIGHT AWAY IF:   You have blood in your throw up.  You have black or bloody poop (stool).  You have a bad headache or stiff neck.  You feel confused.  You have bad belly (abdominal) pain.  You have chest pain or trouble breathing.  You do not pee at least once every 8 hours.  You have cold, clammy skin.  You keep throwing up after 24 to 48 hours.  You have a fever. MAKE SURE YOU:   Understand these instructions.  Will watch your condition.  Will get help right away if you are not doing well or get worse. Document Released: 05/21/2008 Document Revised: 02/25/2012 Document Reviewed: 05/04/2011 [facility] Patient Information 2015 [place], [place]. This information is not intended to replace advice given to you by your health care provider. Make sure you  discuss any questions you have with your health care provider.  Syncope Syncope is a medical term for fainting or passing out. This means you lose consciousness and drop to the ground. People are generally unconscious for less than 5 minutes. You may have some muscle twitches for up to 15 seconds before waking up and returning to normal. Syncope occurs more often in older adults, but it can happen to anyone. While most causes of syncope are not dangerous, syncope can be a sign of a serious medical problem. It is important to seek medical care.  CAUSES  Syncope is caused by a sudden drop in blood flow to the brain. The specific cause is often not determined. Factors that can bring on syncope include:  Taking medicines that lower blood pressure.  Sudden changes in posture, such as standing up quickly.  Taking more medicine than prescribed.  Standing in one place for too long.  Seizure disorders.  Dehydration and excessive exposure to heat.  Low blood sugar (hypoglycemia).  Straining to have a bowel movement.  Heart disease, irregular heartbeat, or other circulatory problems.  Fear, emotional distress, seeing blood, or severe pain. SYMPTOMS  Right before fainting, you may:  Feel dizzy or light-headed.  Feel nauseous.  See all white or all black in your field of vision.  Have cold, clammy skin. DIAGNOSIS  Your health care provider will ask about your symptoms, perform a physical exam, and perform an electrocardiogram (ECG) to record the electrical activity of your  heart. Your health care provider may also perform other heart or blood tests to determine the cause of your syncope which may include:  Transthoracic echocardiogram (TTE). During echocardiography, sound waves are used to evaluate how blood flows through your heart.  Transesophageal echocardiogram (TEE).  Cardiac monitoring. This allows your health care provider to monitor your heart rate and rhythm in real  time.  Holter monitor. This is a portable device that records your heartbeat and can help diagnose heart arrhythmias. It allows your health care provider to track your heart activity for several days, if needed.  Stress tests by exercise or by giving medicine that makes the heart beat faster. TREATMENT  In most cases, no treatment is needed. Depending on the cause of your syncope, your health care provider may recommend changing or stopping some of your medicines. HOME CARE INSTRUCTIONS  Have someone stay with you until you feel stable.  Do not drive, use machinery, or play sports until your health care provider says it is okay.  Keep all follow-up appointments as directed by your health care provider.  Lie down right away if you start feeling like you might faint. Breathe deeply and steadily. Wait until all the symptoms have passed.  Drink enough fluids to keep your urine clear or pale yellow.  If you are taking blood pressure or heart medicine, get up slowly and take several minutes to sit and then stand. This can reduce dizziness. SEEK IMMEDIATE MEDICAL CARE IF:   You have a severe headache.  You have unusual pain in the chest, abdomen, or back.  You are bleeding from your mouth or rectum, or you have black or tarry stool.  You have an irregular or very fast heartbeat.  You have pain with breathing.  You have repeated fainting or seizure-like jerking during an episode.  You faint when sitting or lying down.  You have confusion.  You have trouble walking.  You have severe weakness.  You have vision problems. If you fainted, call your local emergency services (911 in [place] Do not drive yourself to the hospital.  MAKE SURE YOU:  Understand these instructions.  Will watch your condition.  Will get help right away if you are not doing well or get worse. Document Released: 12/03/2005 Document Revised: 12/08/2013 Document Reviewed: 02/01/2012 [facility] Patient  Information 2015 [place], [place]. This information is not intended to replace advice given to you by your health care provider. Make sure you discuss any questions you have with your health care provider.

## 2015-07-21 NOTE — ED Notes (Signed)
MD at bedside. 

## 2015-08-11 ENCOUNTER — Encounter: Payer: Self-pay | Admitting: Family Medicine

## 2015-08-11 ENCOUNTER — Ambulatory Visit (INDEPENDENT_AMBULATORY_CARE_PROVIDER_SITE_OTHER): Payer: Medicare PPO | Admitting: Family Medicine

## 2015-08-11 VITALS — BP 98/60 | HR 72 | Temp 98.0°F | Resp 16 | Wt 159.8 lb

## 2015-08-11 DIAGNOSIS — K5900 Constipation, unspecified: Secondary | ICD-10-CM | POA: Diagnosis not present

## 2015-08-11 DIAGNOSIS — R4189 Other symptoms and signs involving cognitive functions and awareness: Secondary | ICD-10-CM | POA: Diagnosis not present

## 2015-08-11 MED ORDER — LINACLOTIDE 145 MCG PO CAPS: 145.0000 ug | ORAL_CAPSULE | Freq: Every day | ORAL | Status: DC

## 2015-08-11 MED ORDER — DIAZEPAM 5 MG PO TABS: 5.0000 mg | ORAL_TABLET | Freq: Four times a day (QID) | ORAL | Status: DC | PRN

## 2015-08-11 NOTE — Progress Notes (Signed)
Patient ID: Julie Acevedo, female   DOB: 02-08-1951, 64 y.o.   MRN: [id number]    Subjective:  HPI ER visit on 07/21/2015, taken to ER by EMS  Diagnosis:  Non- intractable vomiting with nausea- Symptoms stable  Syncope- CT scan, checked labs Symptoms stable  Constipation- Patient finished Linzess. Patient states medication helped her go to the bedroom a little bit. Patient has finished Linzess. Patient states she also took Amitiza.  Symptoms remain  Patient also states she has decreased memory that's ongoing X 1 year and symptoms seem to be getting worse.   Prior to Admission medications   Medication Sig Start Date End Date Taking? Authorizing Provider  amLODipine (NORVASC) 10 MG tablet Take 1 tablet (10 mg total) by mouth daily. 06/03/15  Yes  Maceo Pro., MD  aspirin EC 325 MG EC tablet Take 1 tablet (325 mg total) by mouth daily. 11/18/12  Yes Adeline Saralyn Pilar, MD  diazepam (VALIUM) 5 MG tablet Take 1 tablet (5 mg total) by mouth every 6 (six) hours as needed for anxiety. 1/2 tab to 1 tab Patient taking differently: Take 2.5-5 mg by mouth every 6 (six) hours as needed for muscle spasms.  07/05/15  Yes  Maceo Pro., MD  hydrochlorothiazide (HYDRODIURIL) 25 MG tablet Take 1 tablet (25 mg total) by mouth daily. 06/03/15  Yes  Maceo Pro., MD  HYDROcodone-acetaminophen [facility]) 10-325 MG per tablet Take 1 tablet by mouth every 6 (six) hours as needed for moderate pain.   Yes Historical Provider, MD  metFORMIN (GLUCOPHAGE) 500 MG tablet Take 1 tablet (500 mg total) by mouth 2 (two) times daily with a meal. 06/03/15  Yes Jerrol Banana., MD  Prenatal Vit-Fe Fumarate-FA (PRENATAL MULTIVITAMIN) TABS tablet Take 1 tablet by mouth daily.   Yes Historical Provider, MD    Patient Active Problem List   Diagnosis Date Noted  . Arthritis 06/11/2015  . Back pain, chronic 06/11/2015  . HLD (hyperlipidemia) 06/11/2015  . Decreased potassium in the blood 06/11/2015  .  Cannot sleep 06/11/2015  . L-S radiculopathy 06/11/2015  . Arthritis of knee, degenerative 06/11/2015  . B12 deficiency 06/11/2015  . 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 Acute encephalopathy 11/15/2012  . Dysphasia 11/15/2012  . DM2 (diabetes mellitus, type 2) 11/15/2012  . DM (diabetes mellitus) 01/27/2012  . Chest pain, localized 01/27/2012  . HTN (hypertension) 01/27/2012  . Adiposity 11/21/2009  . Avitaminosis D 11/07/2009  . Narrowing of intervertebral disc space 07/25/2009  . Diabetes 07/25/2009  . Benign essential HTN 07/25/2009    Past Medical History  Diagnosis Date  . DM (diabetes mellitus)   . HTN (hypertension)   . Diverticulitis   . Arthritis   . Coronary artery disease   . Stroke   . Short-term memory loss   . Myocardial infarction     Social History   Social History  . Marital Status: Married    Spouse Name: N/A  . Number of Children: 3  . Years of Education: N/A   Occupational History  . Lab [employer]    Social History Main Topics  . Smoking status: Never Smoker   . Smokeless tobacco: Never Used  . Alcohol Use: No  . Drug Use: No  . Sexual Activity: Not on file   Other Topics Concern  . Not on file   Social History Narrative   Lives with husband, daughter and granddaughter.;  Allergies  Allergen Reactions  . Lotrel [Amlodipine Besy-Benazepril Hcl] Anaphylaxis  . Contrast Media [Iodinated Diagnostic Agents] Swelling and Other (See Comments)    Pt states that it causes her neck to swell.   . Niacin Hives  . Sulfa Antibiotics Hives    Review of Systems  Constitutional: Negative.   HENT: Negative.   Eyes: Negative.   Respiratory: Negative.   Cardiovascular: Negative.   Gastrointestinal: Positive for constipation.  Genitourinary: Negative.   Musculoskeletal: Negative.   Skin: Negative.   Neurological: Negative.   Endo/Heme/Allergies: Negative.   Psychiatric/Behavioral:  Negative.     Immunization History  Administered Date(s) Administered  . Pneumococcal Polysaccharide-23 11/17/2012  . Tdap 10/02/2011   Objective:  BP 98/60 mmHg  Pulse 72  Temp(Src) 98 F (36.7 C) (Oral)  Resp 16  Wt 159 lb 12.8 oz (72.485 kg)  Physical Exam  Constitutional: She is oriented to person, place, and time and well-developed, well-nourished, and in no distress.  HENT:  Head: Normocephalic and atraumatic.  Right Ear: External ear normal.  Left Ear: External ear normal.  Nose: Nose normal.  Eyes: Conjunctivae are normal.  Neck: Neck supple.  Cardiovascular: Normal rate, regular rhythm and normal heart sounds.   Pulmonary/Chest: Effort normal and breath sounds normal.  Abdominal: Soft.  Musculoskeletal:  Patient appears to be in ongoing, chronic pain with her back. She has no point tenderness along the cervical thoracic or LS spine. This is a chronic problem for this patient.  Neurological: She is alert and oriented to person, place, and time.  Skin: Skin is warm and dry.  Psychiatric: Mood, memory, affect and judgment normal.    Lab Results  Component Value Date   WBC 9.3 07/21/2015   HGB 11.8* 07/21/2015   HCT 36.1 07/21/2015   PLT 314 07/21/2015   GLUCOSE 107* 07/21/2015   CHOL 154 01/18/2014   TRIG 53 01/18/2014   HDL 74* 01/18/2014   LDLCALC 69 01/18/2014   TSH 1.504 11/15/2012   INR 1.02 10/04/2014   HGBA1C 6.0 07/05/2015    CMP     Component Value Date/Time   NA 141 07/21/2015 1639   NA 140 02/17/2014 1843   NA 142 01/21/2014   K 3.6 07/21/2015 1639   K 2.9* 02/17/2014 1843   CL 104 07/21/2015 1639   CL 106 02/17/2014 1843   CO2 28 07/21/2015 1639   CO2 29 02/17/2014 1843   GLUCOSE 107* 07/21/2015 1639   GLUCOSE 95 02/17/2014 1843   BUN 12 07/21/2015 1639   BUN 13 02/17/2014 1843   BUN 10 01/21/2014   CREATININE 0.76 07/21/2015 1639   CREATININE 0.87 02/17/2014 1843   CREATININE 0.9 01/21/2014   CALCIUM 9.5 07/21/2015 1639    CALCIUM 9.4 02/17/2014 1843   PROT 7.4 10/04/2014 1231   PROT 7.5 01/17/2014 1945   ALBUMIN 3.4* 10/04/2014 1231   ALBUMIN 3.5 01/17/2014 1945   AST 16 10/04/2014 1231   AST 20 01/17/2014 1945   ALT 10 10/04/2014 1231   ALT 21 01/17/2014 1945   ALKPHOS 117 10/04/2014 1231   ALKPHOS 116 01/17/2014 1945   BILITOT 0.2* 10/04/2014 1231   BILITOT 0.1* 01/17/2014 1945   GFRNONAA >60 07/21/2015 1639   GFRNONAA >60 02/17/2014 1843   GFRAA >60 07/21/2015 1639   GFRAA >60 02/17/2014 1843    Assessment and Plan :  1. Constipation, unspecified constipation type  - Linaclotide (LINZESS) 145 MCG CAPS capsule; Take 1 capsule (145 mcg total) by mouth daily.  Given as samples  Dispense: 30 capsule; Refill: 12  2. Cognitive change/MCI MMSE 21/30--refer to neurology.No meds presently nor imaging. Get TSH,ESR,B12.,MetC - Ambulatory referral to Neurology  3.Chronic Back Pain/OA/DDD Refill Valium 5 mg q hs which pt says helps spasm.  4.Syncope Neurology pending. This appears to be related to N/V and had nlrmal ED evaluation. Cponsider cardiology evaluation. I have done the exam and reviewed the above chart and it is accurate to the best of my knowledge.    Miguel Aschoff MD [facility] 08/11/2015 11:29 AM

## 2015-09-01 ENCOUNTER — Encounter: Payer: Self-pay | Admitting: Neurology

## 2015-09-01 ENCOUNTER — Ambulatory Visit (INDEPENDENT_AMBULATORY_CARE_PROVIDER_SITE_OTHER): Payer: Medicare PPO | Admitting: Neurology

## 2015-09-01 VITALS — BP 120/78 | HR 66 | Ht 62.0 in | Wt 157.5 lb

## 2015-09-01 DIAGNOSIS — R202 Paresthesia of skin: Secondary | ICD-10-CM | POA: Diagnosis not present

## 2015-09-01 DIAGNOSIS — M79609 Pain in unspecified limb: Secondary | ICD-10-CM | POA: Diagnosis not present

## 2015-09-01 DIAGNOSIS — R413 Other amnesia: Secondary | ICD-10-CM | POA: Diagnosis not present

## 2015-09-01 HISTORY — DX: Other amnesia: R41.3

## 2015-09-01 MED ORDER — DONEPEZIL HCL 5 MG PO TABS: 5.0000 mg | ORAL_TABLET | Freq: Every day | ORAL | Status: DC

## 2015-09-01 NOTE — Progress Notes (Signed)
Reason for visit: Memory disturbance  Referring physician: Dr. Fanny Skates Julie Acevedo is a 64 y.o. female  History of present illness:  Julie Acevedo is a 64 year old left-handed black female with a history of chronic low back pain issues. She reports that over the last year or so, she has noted a gradual change in her memory. She has short-term memory problems, difficulty remembering recent events. She will repeat herself frequently. She indicates that her daughter has begun helping her keep up with her medications and appointments. She now has a pill dispenser. Her husband does the bills. She does operate a motor vehicle, but she occasionally will have some difficulty with directions. With cooking, she may put something in the oven, then forget about it, burning the food. She also reports some chronic fatigue issues, difficulty with sleeping. She sleeps only about 3 or 4 hours at night. She reports some mild balance issues, and some occasional falls. She reports chronic constipation problems, no particular difficulty controlling the bladder. She comes to this office for further evaluation. She has undergone a recent CT scan of the brain in August 2016, this appears to be unremarkable. MRI of the brain was done in October 2015, showed no acute changes, minimal small vessel disease.  Past Medical History  Diagnosis Date  . DM (diabetes mellitus)   . HTN (hypertension)   . Diverticulitis   . Arthritis   . Coronary artery disease   . Stroke   . Short-term memory loss   . Myocardial infarction   . Memory difficulties 09/01/2015    Past Surgical History  Procedure Laterality Date  . Hemicolectomy    . Lumbar disc surgery    . Roux-en-y gastric bypass    . Cholecystectomy    . Eye surgery    . Hand surgery    . Foot surgery    . Abdominal hysterectomy      due to endometriosis-1 ovary left  . Tonsillectomy      Family History  Problem Relation Age of Onset  . Cancer Father 20   Lung  . Coronary artery disease Father 21  . COPD Mother   . Diabetes Mother   . Hypertension Mother   . Cancer Paternal Aunt     Breast  . Cancer Paternal Grandmother     Breast  . Congestive Heart Failure Maternal Grandmother   . Emphysema Maternal Grandfather   . Heart attack Paternal Grandfather     Social history:  reports that she has never smoked. She has never used smokeless tobacco. She reports that she does not drink alcohol or use illicit drugs.  Medications:  Prior to Admission medications   Medication Sig Start Date End Date Taking? Authorizing Provider  amLODipine (NORVASC) 10 MG tablet Take 1 tablet (10 mg total) by mouth daily. 06/03/15  Yes Richard Maceo Pro., MD  aspirin EC 325 MG EC tablet Take 1 tablet (325 mg total) by mouth daily. 11/18/12  Yes Adeline Saralyn Pilar, MD  diazepam (VALIUM) 5 MG tablet Take 1 tablet (5 mg total) by mouth every 6 (six) hours as needed for anxiety. 1/2 tab to 1 tab 08/11/15  Yes Richard Maceo Pro., MD  hydrochlorothiazide (HYDRODIURIL) 25 MG tablet Take 1 tablet (25 mg total) by mouth daily. 06/03/15  Yes Richard Maceo Pro., MD  HYDROcodone-acetaminophen [facility]) 10-325 MG per tablet Take 1 tablet by mouth every 6 (six) hours as needed for moderate pain.   Yes Historical  Provider, MD  Linaclotide Rolan Lipa) 145 MCG CAPS capsule Take 1 capsule (145 mcg total) by mouth daily. Given as samples 08/11/15  Yes Richard Maceo Pro., MD  metFORMIN (GLUCOPHAGE) 500 MG tablet Take 1 tablet (500 mg total) by mouth 2 (two) times daily with a meal. 06/03/15  Yes Jerrol Banana., MD  Prenatal Vit-Fe Fumarate-FA (PRENATAL MULTIVITAMIN) TABS tablet Take 1 tablet by mouth daily.   Yes Historical Provider, MD      Allergies  Allergen Reactions  . Lotrel [Amlodipine Besy-Benazepril Hcl] Anaphylaxis  . Contrast Media [Iodinated Diagnostic Agents] Swelling and Other (See Comments)    Pt states that it causes her neck to swell.   . Niacin Hives  .  Sulfa Antibiotics Hives    ROS:  Out of a complete 14 system review of symptoms, the patient complains only of the following symptoms, and all other reviewed systems are negative.  Birthmarks, moles Constipation Joint pain, joint swelling, aching muscles Memory loss, confusion, dizziness Change in appetite, disinterest in activities, insomnia  Blood pressure 120/78, pulse 66, height 5\' 2"  (1.575 m), weight 157 lb 8 oz (71.442 kg).  Physical Exam  General: The patient is alert and cooperative at the time of the examination.  Eyes: Pupils are equal, round, and reactive to light. Discs are flat bilaterally.  Neck: The neck is supple, no carotid bruits are noted.  Respiratory: The respiratory examination is clear.  Cardiovascular: The cardiovascular examination reveals a regular rate and rhythm, no obvious murmurs or rubs are noted.  Skin: Extremities are without significant edema. 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  Neurologic Exam  Mental status: The patient is alert and oriented x 2 at the time of the examination (not oriented to date). The Mini-Mental Status Examination done today shows a total score 25/30. The patient is able to name 8 animals in 30 seconds.  Cranial nerves: Facial symmetry is present. There is good sensation of the face to pinprick and soft touch bilaterally. The strength of the facial muscles and the muscles to head turning and shoulder shrug are normal bilaterally. Speech is well enunciated, no aphasia or dysarthria is noted. Extraocular movements are full. Visual fields are full. The tongue is midline, and the patient has symmetric elevation of the soft palate. No obvious hearing deficits are noted.  Motor: The motor testing reveals 5 over 5 strength of all 4 extremities, but the patient has some slight giveaway weakness on the right arm and right leg. Good symmetric motor tone is noted throughout.  Sensory: Sensory testing is intact  to pinprick, soft touch, vibration sensation, and position sense on the left extremities. There appears to be some decrease in pinprick, vibration sensation, and position sensation on the right arm and right leg. No evidence of extinction is noted.  Coordination: Cerebellar testing reveals good finger-nose-finger and heel-to-shin bilaterally.  Gait and station: Gait is normal. Tandem gait is slightly unsteady. Romberg is negative. No drift is seen.  Reflexes: Deep tendon reflexes are symmetric and normal bilaterally. Toes are downgoing bilaterally.   CT head 07/21/15:  IMPRESSION: 1. No acute intracranial pathology seen on CT. 2. Mild small vessel ischemic microangiopathy.  * CT scan images were reviewed online. I agree with the written report.   Assessment/Plan:  1. Progressive memory disturbance  2. Right hemisensory deficit  The physical examination today shows that the patient has some alteration in sensation on the right arm and right leg, some difficulty  with using the right side as well. The patient has undergone a recent CT scan of the brain that was unremarkable, and a MRI of the brain was done in October 2015. However, because of the focality above, I will repeat MRI evaluation of the brain. The patient will be set up for blood work today. We will add low-dose Aricept to her regimen, the patient will contact the office if she is tolerating the 5 mg dose after one month, we will increase the patient to the 10 mg maintenance dose. The patient will follow-up in 6 months, sooner if needed.  Jill Alexanders MD 09/01/2015 3:35 PM  [facility] [address] [place], [place] [address]  Phone [phone] Fax [phone]

## 2015-09-01 NOTE — Patient Instructions (Signed)
   We will start Aricept at night for memory. Will check blood work today and get MRI of the brain. I will call you with the results.   Begin Aricept (donepezil) at 5 mg at night for one month. If this medication is well-tolerated, please call our office and we will call in a prescription for the 10 mg tablets. Look out for side effects that may include nausea, diarrhea, weight loss, or stomach cramps. This medication will also cause a runny nose, therefore there is no need for allergy medications for this purpose.

## 2015-09-04 LAB — HIV ANTIBODY (ROUTINE TESTING W REFLEX): HIV Screen 4th Generation wRfx: NONREACTIVE

## 2015-09-04 LAB — SEDIMENTATION RATE: Sed Rate: 26 mm/hr (ref 0–40)

## 2015-09-04 LAB — RPR: RPR Ser Ql: NONREACTIVE

## 2015-09-04 LAB — VITAMIN B12: Vitamin B-12: 383 pg/mL (ref 211–946)

## 2015-09-04 LAB — COPPER, SERUM: Copper: 129 ug/dL (ref 72–166)

## 2015-09-04 LAB — METHYLMALONIC ACID, SERUM: Methylmalonic Acid: 153 nmol/L (ref 0–378)

## 2015-09-06 ENCOUNTER — Telehealth: Payer: Self-pay

## 2015-09-06 NOTE — Telephone Encounter (Signed)
I called the patient and relayed results. 

## 2015-09-06 NOTE — Telephone Encounter (Signed)
-----   Message from Kathrynn Ducking, MD sent at 09/05/2015  5:18 PM EDT -----  The blood work results are unremarkable. Please call the patient.  ----- Message -----    From: [facility] Lab Results In Interface    Sent: 09/02/2015   7:45 AM      To: Kathrynn Ducking, MD

## 2015-09-16 ENCOUNTER — Other Ambulatory Visit: Payer: Medicare PPO

## 2015-09-20 ENCOUNTER — Emergency Department
Admission: EM | Admit: 2015-09-20 | Discharge: 2015-09-20 | Discharge status: Against Medical Advice | Payer: Medicare PPO | Attending: Emergency Medicine | Admitting: Emergency Medicine

## 2015-09-20 DIAGNOSIS — M25512 Pain in left shoulder: Secondary | ICD-10-CM | POA: Insufficient documentation

## 2015-09-20 DIAGNOSIS — E119 Type 2 diabetes mellitus without complications: Secondary | ICD-10-CM | POA: Insufficient documentation

## 2015-09-20 DIAGNOSIS — I10 Essential (primary) hypertension: Secondary | ICD-10-CM | POA: Insufficient documentation

## 2015-09-20 LAB — BASIC METABOLIC PANEL
Anion gap: 7 (ref 5–15)
BUN: 13 mg/dL (ref 6–20)
CO2: 31 mmol/L (ref 22–32)
Calcium: 9.4 mg/dL (ref 8.9–10.3)
Chloride: 105 mmol/L (ref 101–111)
Creatinine, Ser: 0.89 mg/dL (ref 0.44–1.00)
GFR calc Af Amer: >60 mL/min (ref >60)
GFR calc non Af Amer: >60 mL/min (ref >60)
Glucose, Bld: 96 mg/dL (ref 65–99)
Potassium: 3.2 mmol/L — ABNORMAL LOW (ref 3.5–5.1)
Sodium: 143 mmol/L (ref 135–145)

## 2015-09-20 LAB — CBC
HCT: 34.5 % — ABNORMAL LOW (ref 35.0–47.0)
Hemoglobin: 11.2 g/dL — ABNORMAL LOW (ref 12.0–16.0)
MCH: 26.9 pg (ref 26.0–34.0)
MCHC: 32.6 g/dL (ref 32.0–36.0)
MCV: 82.5 fL (ref 80.0–100.0)
Platelets: 338 10*3/uL (ref 150–440)
RBC: 4.18 MIL/uL (ref 3.80–5.20)
RDW: 14.9 % — ABNORMAL HIGH (ref 11.5–14.5)
WBC: 8.1 10*3/uL (ref 3.6–11.0)

## 2015-09-20 LAB — TROPONIN I: Troponin I: <0.03 ng/mL (ref <0.031)

## 2015-09-20 NOTE — ED Notes (Signed)
Pt to triage via w/c with no distress noted; st awoke at midnight with pain to left shoulder/arm/hand; denies accomp symptoms; denies hx of same but reports MI in past

## 2015-09-21 ENCOUNTER — Telehealth: Payer: Self-pay | Admitting: Emergency Medicine

## 2015-09-21 NOTE — ED Notes (Signed)
Called patient due to lwot to inquire about condition and follow up plans. Left message with my number. 

## 2015-09-27 ENCOUNTER — Ambulatory Visit
Admission: RE | Admit: 2015-09-27 | Discharge: 2015-09-27 | Disposition: A | Discharge status: Home / Self Care | Payer: Medicare PPO | Source: Ambulatory Visit | Attending: Neurology | Admitting: Neurology

## 2015-09-27 DIAGNOSIS — M79609 Pain in unspecified limb: Secondary | ICD-10-CM

## 2015-09-27 DIAGNOSIS — R413 Other amnesia: Secondary | ICD-10-CM | POA: Diagnosis not present

## 2015-09-27 DIAGNOSIS — R202 Paresthesia of skin: Secondary | ICD-10-CM

## 2015-09-28 ENCOUNTER — Encounter (HOSPITAL_COMMUNITY): Payer: Self-pay

## 2015-09-28 ENCOUNTER — Emergency Department (HOSPITAL_COMMUNITY): Payer: Medicare PPO

## 2015-09-28 ENCOUNTER — Emergency Department (HOSPITAL_COMMUNITY)
Admission: EM | Admit: 2015-09-28 | Discharge: 2015-09-28 | Disposition: A | Discharge status: Home / Self Care | Payer: Medicare PPO | Attending: Emergency Medicine | Admitting: Emergency Medicine

## 2015-09-28 DIAGNOSIS — E119 Type 2 diabetes mellitus without complications: Secondary | ICD-10-CM | POA: Insufficient documentation

## 2015-09-28 DIAGNOSIS — I2510 Atherosclerotic heart disease of native coronary artery without angina pectoris: Secondary | ICD-10-CM | POA: Insufficient documentation

## 2015-09-28 DIAGNOSIS — I10 Essential (primary) hypertension: Secondary | ICD-10-CM | POA: Diagnosis not present

## 2015-09-28 DIAGNOSIS — M1990 Unspecified osteoarthritis, unspecified site: Secondary | ICD-10-CM | POA: Diagnosis not present

## 2015-09-28 DIAGNOSIS — Z8673 Personal history of transient ischemic attack (TIA), and cerebral infarction without residual deficits: Secondary | ICD-10-CM | POA: Insufficient documentation

## 2015-09-28 DIAGNOSIS — R079 Chest pain, unspecified: Secondary | ICD-10-CM | POA: Insufficient documentation

## 2015-09-28 DIAGNOSIS — I252 Old myocardial infarction: Secondary | ICD-10-CM | POA: Insufficient documentation

## 2015-09-28 DIAGNOSIS — Z79899 Other long term (current) drug therapy: Secondary | ICD-10-CM | POA: Insufficient documentation

## 2015-09-28 DIAGNOSIS — R61 Generalized hyperhidrosis: Secondary | ICD-10-CM | POA: Insufficient documentation

## 2015-09-28 DIAGNOSIS — R600 Localized edema: Secondary | ICD-10-CM | POA: Insufficient documentation

## 2015-09-28 DIAGNOSIS — Z8719 Personal history of other diseases of the digestive system: Secondary | ICD-10-CM | POA: Insufficient documentation

## 2015-09-28 LAB — BASIC METABOLIC PANEL
Anion gap: 7 (ref 5–15)
BUN: 15 mg/dL (ref 6–20)
CO2: 30 mmol/L (ref 22–32)
Calcium: 9.2 mg/dL (ref 8.9–10.3)
Chloride: 101 mmol/L (ref 101–111)
Creatinine, Ser: 0.96 mg/dL (ref 0.44–1.00)
GFR calc Af Amer: >60 mL/min (ref >60)
GFR calc non Af Amer: >60 mL/min (ref >60)
Glucose, Bld: 81 mg/dL (ref 65–99)
Potassium: 3.1 mmol/L — ABNORMAL LOW (ref 3.5–5.1)
Sodium: 138 mmol/L (ref 135–145)

## 2015-09-28 LAB — I-STAT TROPONIN, ED
Troponin i, poc: 0 ng/mL (ref 0.00–0.08)
Troponin i, poc: 0 ng/mL (ref 0.00–0.08)

## 2015-09-28 LAB — BRAIN NATRIURETIC PEPTIDE: B Natriuretic Peptide: 40.2 pg/mL (ref 0.0–100.0)

## 2015-09-28 LAB — D-DIMER, QUANTITATIVE: D-Dimer, Quant: 0.61 ug/mL-FEU — ABNORMAL HIGH (ref 0.00–0.48)

## 2015-09-28 MED ORDER — POTASSIUM CHLORIDE CRYS ER 20 MEQ PO TBCR
40.0000 meq | EXTENDED_RELEASE_TABLET | Freq: Once | ORAL | Status: AC
  Administered 2015-09-28: 40 meq via ORAL
  Filled 2015-09-28: qty 2

## 2015-09-28 MED ORDER — NITROGLYCERIN 0.4 MG SL SUBL: 0.4000 mg | SUBLINGUAL_TABLET | SUBLINGUAL | Status: DC | PRN
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
## 2015-09-28 NOTE — ED Notes (Signed)
MD Resident at the bedside  

## 2015-09-28 NOTE — ED Notes (Signed)
Patient returned from X-ray 

## 2015-09-28 NOTE — Discharge Instructions (Signed)
Nonspecific Chest Pain  °Chest pain can be caused by many different conditions. There is always a chance that your pain could be related to something serious, such as a heart attack or a blood clot in your lungs. Chest pain can also be caused by conditions that are not life-threatening. If you have chest pain, it is very important to follow up with your health care provider. °CAUSES  °Chest pain can be caused by: °· Heartburn. °· Pneumonia or bronchitis. °· Anxiety or stress. °· Inflammation around your heart (pericarditis) or lung (pleuritis or pleurisy). °· A blood clot in your lung. °· A collapsed lung (pneumothorax). It can develop suddenly on its own (spontaneous pneumothorax) or from trauma to the chest. °· Shingles infection (varicella-zoster virus). °· Heart attack. °· Damage to the bones, muscles, and cartilage that make up your chest wall. This can include: °¨ Bruised bones due to injury. °¨ Strained muscles or cartilage due to frequent or repeated coughing or overwork. °¨ Fracture to one or more ribs. °¨ Sore cartilage due to inflammation (costochondritis). °RISK FACTORS  °Risk factors for chest pain may include: °· Activities that increase your risk for trauma or injury to your chest. °· Respiratory infections or conditions that cause frequent coughing. °· Medical conditions or overeating that can cause heartburn. °· Heart disease or family history of heart disease. °· Conditions or health behaviors that increase your risk of developing a blood clot. °· Having had chicken pox (varicella zoster). °SIGNS AND SYMPTOMS °Chest pain can feel like: °· Burning or tingling on the surface of your chest or deep in your chest. °· Crushing, pressure, aching, or squeezing pain. °· Dull or sharp pain that is worse when you move, cough, or take a deep breath. °· Pain that is also felt in your back, neck, shoulder, or arm, or pain that spreads to any of these areas. °Your chest pain may come and go, or it may stay  constant. °DIAGNOSIS °Lab tests or other studies may be needed to find the cause of your pain. Your health care provider may have you take a test called an ambulatory ECG (electrocardiogram). An ECG records your heartbeat patterns at the time the test is performed. You may also have other tests, such as: °· Transthoracic echocardiogram (TTE). During echocardiography, sound waves are used to create a picture of all of the heart structures and to look at how blood flows through your heart. °· Transesophageal echocardiogram (TEE). This is a more advanced imaging test that obtains images from inside your body. It allows your health care provider to see your heart in finer detail. °· Cardiac monitoring. This allows your health care provider to monitor your heart rate and rhythm in real time. °· Holter monitor. This is a portable device that records your heartbeat and can help to diagnose abnormal heartbeats. It allows your health care provider to track your heart activity for several days, if needed. °· Stress tests. These can be done through exercise or by taking medicine that makes your heart beat more quickly. °· Blood tests. °· Imaging tests. °TREATMENT  °Your treatment depends on what is causing your chest pain. Treatment may include: °· Medicines. These may include: °¨ Acid blockers for heartburn. °¨ Anti-inflammatory medicine. °¨ Pain medicine for inflammatory conditions. °¨ Antibiotic medicine, if an infection is present. °¨ Medicines to dissolve blood clots. °¨ Medicines to treat coronary artery disease. °· Supportive care for conditions that do not require medicines. This may include: °¨ Resting. °¨ Applying heat   or cold packs to injured areas. °¨ Limiting activities until pain decreases. °HOME CARE INSTRUCTIONS °· If you were prescribed an antibiotic medicine, finish it all even if you start to feel better. °· Avoid any activities that bring on chest pain. °· Do not use any tobacco products, including  cigarettes, chewing tobacco, or electronic cigarettes. If you need help quitting, ask your health care provider. °· Do not drink alcohol. °· Take medicines only as directed by your health care provider. °· Keep all follow-up visits as directed by your health care provider. This is important. This includes any further testing if your chest pain does not go away. °· If heartburn is the cause for your chest pain, you may be told to keep your head raised (elevated) while sleeping. This reduces the chance that acid will go from your stomach into your esophagus. °· Make lifestyle changes as directed by your health care provider. These may include: °¨ Getting regular exercise. Ask your health care provider to suggest some activities that are safe for you. °¨ Eating a heart-healthy diet. A registered dietitian can help you to learn healthy eating options. °¨ Maintaining a healthy weight. °¨ Managing diabetes, if necessary. °¨ Reducing stress. °SEEK MEDICAL CARE IF: °· Your chest pain does not go away after treatment. °· You have a rash with blisters on your chest. °· You have a fever. °SEEK IMMEDIATE MEDICAL CARE IF:  °· Your chest pain is worse. °· You have an increasing cough, or you cough up blood. °· You have severe abdominal pain. °· You have severe weakness. °· You faint. °· You have chills. °· You have sudden, unexplained chest discomfort. °· You have sudden, unexplained discomfort in your arms, back, neck, or jaw. °· You have shortness of breath at any time. °· You suddenly start to sweat, or your skin gets clammy. °· You feel nauseous or you vomit. °· You suddenly feel light-headed or dizzy. °· Your heart begins to beat quickly, or it feels like it is skipping beats. °These symptoms may represent a serious problem that is an emergency. Do not wait to see if the symptoms will go away. Get medical help right away. Call your local emergency services (911 in the [place] Do not drive yourself to the hospital. °  °This  information is not intended to replace advice given to you by your health care provider. Make sure you discuss any questions you have with your health care provider. °  °Document Released: 09/12/2005 Document Revised: 12/24/2014 Document Reviewed: 07/09/2014 °Elsevier Interactive Patient Education ©2016 [employer] ° °

## 2015-09-28 NOTE — ED Provider Notes (Signed)
CSN: [id number]     Arrival date & time 09/28/15  0920 History   First MD Initiated Contact with Patient 09/28/15 [id number]     Chief Complaint  Patient presents with  . Chest Pain     (Consider location/radiation/quality/duration/timing/severity/associated sxs/prior Treatment) Patient is a 64 y.o. female presenting with chest pain. The history is provided by the patient.  Chest Pain Pain location:  Substernal area Pain quality: pressure   Pain radiates to:  L shoulder Pain radiates to the back: no   Pain severity:  Severe Onset quality:  Sudden Duration:  20 minutes Timing:  Constant Progression:  Partially resolved Chronicity:  New Relieved by:  Nitroglycerin Worsened by:  Nothing tried Ineffective treatments:  None tried Associated symptoms: diaphoresis   Associated symptoms: no dizziness, no fever, no headache, no nausea, no palpitations, no shortness of breath and not vomiting   Risk factors: diabetes mellitus and hypertension   Risk factors: no smoking    64 yo F with a chief complaint chest pain. Pain was sudden severe left-sided and radiated up to her left shoulder. This is associated with diaphoresis. Patient denied shortness of breath nausea or vomiting. Patient denies any significant lower extremity edema. Patient denies orthopnea or PND. Patient states that she has a history of MI in the past. Patient has had multiple caths. Thinks this feels slightly different than her normal chest pain. Patient is also had a PE. Patient feels that this is different than that as well. Patient denies recent surgery denies estrogen use. Patient denies cough congestion fevers chills.  Past Medical History  Diagnosis Date  . DM (diabetes mellitus) ([place])   . HTN (hypertension)   . Diverticulitis   . Arthritis   . Coronary artery disease   . Stroke ([place])   . Short-term memory loss   . Myocardial infarction ([place])   . Memory difficulties 09/01/2015   Past Surgical History  Procedure  Laterality Date  . Hemicolectomy    . Lumbar disc surgery    . Roux-en-y gastric bypass    . Cholecystectomy    . Eye surgery    . Hand surgery    . Foot surgery    . Abdominal hysterectomy      due to endometriosis-1 ovary left  . Tonsillectomy     Family History  Problem Relation Age of Onset  . Cancer Father 32    Lung  . Coronary artery disease Father 67  . COPD Mother   . Diabetes Mother   . Hypertension Mother   . Cancer Paternal Aunt     Breast  . Cancer Paternal Grandmother     Breast  . Congestive Heart Failure Maternal Grandmother   . Emphysema Maternal Grandfather   . Heart attack Paternal Grandfather    Social History  Substance Use Topics  . Smoking status: Never Smoker   . Smokeless tobacco: Never Used  . Alcohol Use: No   OB History    No data available     Review of Systems  Constitutional: Positive for diaphoresis. Negative for fever and chills.  HENT: Negative for congestion and rhinorrhea.   Eyes: Negative for redness and visual disturbance.  Respiratory: Negative for shortness of breath and wheezing.   Cardiovascular: Positive for chest pain. Negative for palpitations.  Gastrointestinal: Negative for nausea and vomiting.  Genitourinary: Negative for dysuria and urgency.  Musculoskeletal: Negative for myalgias and arthralgias.  Skin: Negative for pallor and wound.  Neurological: Negative for dizziness and headaches.  Allergies  Lotrel; Contrast media; Niacin; and Sulfa antibiotics  Home Medications   Prior to Admission medications   Medication Sig Start Date End Date Taking? Authorizing Provider  amLODipine (NORVASC) 10 MG tablet Take 1 tablet (10 mg total) by mouth daily. 06/03/15  Yes Richard Maceo Pro., MD  diazepam (VALIUM) 5 MG tablet Take 1 tablet (5 mg total) by mouth every 6 (six) hours as needed for anxiety. 1/2 tab to 1 tab 08/11/15  Yes Richard Maceo Pro., MD  donepezil (ARICEPT) 5 MG tablet Take 1 tablet (5 mg total)  by mouth at bedtime. 09/01/15  Yes Kathrynn Ducking, MD  hydrochlorothiazide (HYDRODIURIL) 25 MG tablet Take 1 tablet (25 mg total) by mouth daily. 06/03/15  Yes Richard Maceo Pro., MD  HYDROcodone-acetaminophen [facility]) 10-325 MG per tablet Take 1 tablet by mouth every 6 (six) hours as needed for moderate pain.   Yes Historical Provider, MD  Linaclotide Rolan Lipa) 145 MCG CAPS capsule Take 1 capsule (145 mcg total) by mouth daily. Given as samples 08/11/15  Yes Richard Maceo Pro., MD  metFORMIN (GLUCOPHAGE) 500 MG tablet Take 1 tablet (500 mg total) by mouth 2 (two) times daily with a meal. 06/03/15  Yes Jerrol Banana., MD  Prenatal Vit-Fe Fumarate-FA (PRENATAL MULTIVITAMIN) TABS tablet Take 1 tablet by mouth daily.   Yes Historical Provider, MD   BP 98/52 mmHg  Pulse 56  Temp(Src) 97.9 F (36.6 C) (Oral)  Resp 11  SpO2 97% Physical Exam  Constitutional: She is oriented to person, place, and time. She appears well-developed and well-nourished. No distress.  HENT:  Head: Normocephalic and atraumatic.  Eyes: EOM are normal. Pupils are equal, round, and reactive to light.  Neck: Normal range of motion. Neck supple.  Cardiovascular: Normal rate and regular rhythm.  Exam reveals no gallop and no friction rub.   No murmur heard. Pulmonary/Chest: Effort normal. She has no wheezes. She has no rales.  Abdominal: Soft. She exhibits no distension. There is no tenderness. There is no rebound.  Musculoskeletal: She exhibits edema (trace). She exhibits no tenderness.  Neurological: She is alert and oriented to person, place, and time.  Skin: Skin is warm and dry. She is not diaphoretic.  Psychiatric: She has a normal mood and affect. Her behavior is normal.    ED Course  Procedures (including critical care time) Labs Review Labs Reviewed  BASIC METABOLIC PANEL - Abnormal; Notable for the following:    Potassium 3.1 (*)    All other components within normal limits  D-DIMER, QUANTITATIVE  (NOT AT [facility]) - Abnormal; Notable for the following:    D-Dimer, Quant 0.61 (*)    All other components within normal limits  BRAIN NATRIURETIC PEPTIDE  I-STAT TROPOININ, ED  Randolm Idol, ED    Imaging Review Dg Chest 2 View  09/28/2015  CLINICAL DATA:  Chest pain. EXAM: CHEST  2 VIEW COMPARISON:  02/03/2015 . FINDINGS: Mediastinum hilar structures normal. Lungs are clear. Stable mild elevation left hemidiaphragm. No pleural effusion pneumothorax. Heart size is stable. Stable left anterior first rib fracture. No acute bony abnormality. IMPRESSION: No acute cardiopulmonary disease.  Stable chest from prior exam. Electronically Signed   By: Marcello Moores  Register   On: 09/28/2015 11:08   I have personally reviewed and evaluated these images and lab results as part of my medical decision-making.   EKG Interpretation   Date/Time:  Wednesday September 28 2015 09:27:33 EDT Ventricular Rate:  69 PR Interval:  154 QRS Duration: 98 QT Interval:  403 QTC Calculation: 432 R Axis:   63 Text Interpretation:  Sinus rhythm Baseline wander in lead(s) I II III aVR  aVL Otherwise no significant change Confirmed by Unnamed Hino MD, DANIEL ([id number])  on 09/28/2015 9:54:03 AM      MDM   Final diagnoses:  Chest pain, unspecified chest pain type    64 yo F with a chief complaint chest pain. Patient with a history of PE as well as possible MI. Looking back at her prior records C multiple clean cath's, multiple negative stress tests. No history of stenting. Initial troponin negative. EKG completely unremarkable., Patient's pain significant improved with 1 nitroglycerin en route by EMS. Given 324 of aspirin by them. Will delta troponin. Patient HEART score of 3.    Delta troponin negative. D-dimer age adjusted negative. Will discharge home. Pain completely relieved on the ED.  3:58 PM:  I have discussed the diagnosis/risks/treatment options with the patient and family and believe the pt to be eligible for  discharge home to follow-up with PCP. We also discussed returning to the ED immediately if new or worsening sx occur. We discussed the sx which are most concerning (e.g., sudden worsening pain, fever) that necessitate immediate return. Medications administered to the patient during their visit and any new prescriptions provided to the patient are listed below.  Medications given during this visit Medications  nitroGLYCERIN (NITROSTAT) SL tablet 0.4 mg (not administered)  potassium chloride SA (K-DUR,KLOR-CON) CR tablet 40 mEq (40 mEq Oral Given 09/28/15 1049)    Discharge Medication List as of 09/28/2015  2:00 PM      The patient appears reasonably screen and/or stabilized for discharge and I doubt any other medical condition or other [facility] requiring further screening, evaluation, or treatment in the ED at this time prior to discharge.    Deno Etienne, DO 09/28/15 1558

## 2015-09-29 ENCOUNTER — Telehealth: Payer: Self-pay | Admitting: Neurology

## 2015-09-29 NOTE — Telephone Encounter (Signed)
  I called patient. MRI the brain shows no changes from 2015. The patient is on low-dose Aricept, if she is tolerating this, she is to contact our office and we will go up to the 10 mg dosing.  MRI brain 09/27/15:  IMPRESSION: Abnormal MRI scan of the brain showing mild changes of chronic microvascular ischemia. No significant change compared with previous MRI scan dated 10/04/2014

## 2015-10-18 ENCOUNTER — Ambulatory Visit: Payer: Medicare PPO | Admitting: Family Medicine

## 2015-11-02 ENCOUNTER — Other Ambulatory Visit: Payer: Self-pay

## 2015-11-02 DIAGNOSIS — I10 Essential (primary) hypertension: Secondary | ICD-10-CM

## 2015-11-02 MED ORDER — HYDROCHLOROTHIAZIDE 25 MG PO TABS: 25.0000 mg | ORAL_TABLET | Freq: Every day | ORAL | Status: DC

## 2015-11-02 MED ORDER — METFORMIN HCL 500 MG PO TABS: 500.0000 mg | ORAL_TABLET | Freq: Two times a day (BID) | ORAL | Status: DC

## 2015-11-02 MED ORDER — AMLODIPINE BESYLATE 10 MG PO TABS: 10.0000 mg | ORAL_TABLET | Freq: Every day | ORAL | Status: DC

## 2015-11-03 ENCOUNTER — Other Ambulatory Visit: Payer: Self-pay

## 2015-11-03 DIAGNOSIS — I10 Essential (primary) hypertension: Secondary | ICD-10-CM

## 2015-11-03 MED ORDER — AMLODIPINE BESYLATE 10 MG PO TABS: 10.0000 mg | ORAL_TABLET | Freq: Every day | ORAL | Status: DC

## 2015-11-03 MED ORDER — HYDROCHLOROTHIAZIDE 25 MG PO TABS: 25.0000 mg | ORAL_TABLET | Freq: Every day | ORAL | Status: DC

## 2015-11-03 MED ORDER — METFORMIN HCL 500 MG PO TABS: 500.0000 mg | ORAL_TABLET | Freq: Two times a day (BID) | ORAL | Status: DC

## 2015-11-04 ENCOUNTER — Other Ambulatory Visit: Payer: Self-pay | Admitting: Neurology

## 2015-12-14 ENCOUNTER — Emergency Department: Payer: Medicare PPO

## 2015-12-14 ENCOUNTER — Encounter: Payer: Self-pay | Admitting: *Deleted

## 2015-12-14 ENCOUNTER — Emergency Department
Admission: EM | Admit: 2015-12-14 | Discharge: 2015-12-14 | Disposition: A | Discharge status: Home / Self Care | Payer: Medicare PPO | Attending: Emergency Medicine | Admitting: Emergency Medicine

## 2015-12-14 DIAGNOSIS — K458 Other specified abdominal hernia without obstruction or gangrene: Secondary | ICD-10-CM | POA: Insufficient documentation

## 2015-12-14 DIAGNOSIS — Z79899 Other long term (current) drug therapy: Secondary | ICD-10-CM | POA: Insufficient documentation

## 2015-12-14 DIAGNOSIS — R109 Unspecified abdominal pain: Secondary | ICD-10-CM | POA: Diagnosis present

## 2015-12-14 DIAGNOSIS — Z7984 Long term (current) use of oral hypoglycemic drugs: Secondary | ICD-10-CM | POA: Diagnosis not present

## 2015-12-14 DIAGNOSIS — E119 Type 2 diabetes mellitus without complications: Secondary | ICD-10-CM | POA: Insufficient documentation

## 2015-12-14 DIAGNOSIS — G8929 Other chronic pain: Secondary | ICD-10-CM | POA: Diagnosis not present

## 2015-12-14 DIAGNOSIS — I10 Essential (primary) hypertension: Secondary | ICD-10-CM | POA: Diagnosis not present

## 2015-12-14 LAB — CBC WITH DIFFERENTIAL/PLATELET
Basophils Absolute: 0 10*3/uL (ref 0–0.1)
Basophils Relative: 0 %
Eosinophils Absolute: 0.1 10*3/uL (ref 0–0.7)
Eosinophils Relative: 2 %
HCT: 34.5 % — ABNORMAL LOW (ref 35.0–47.0)
Hemoglobin: 11.3 g/dL — ABNORMAL LOW (ref 12.0–16.0)
Lymphocytes Relative: 30 %
Lymphs Abs: 1.9 10*3/uL (ref 1.0–3.6)
MCH: 26.7 pg (ref 26.0–34.0)
MCHC: 32.7 g/dL (ref 32.0–36.0)
MCV: 81.7 fL (ref 80.0–100.0)
Monocytes Absolute: 0.7 10*3/uL (ref 0.2–0.9)
Monocytes Relative: 11 %
Neutro Abs: 3.6 10*3/uL (ref 1.4–6.5)
Neutrophils Relative %: 57 %
Platelets: 356 10*3/uL (ref 150–440)
RBC: 4.22 MIL/uL (ref 3.80–5.20)
RDW: 15.6 % — ABNORMAL HIGH (ref 11.5–14.5)
WBC: 6.3 10*3/uL (ref 3.6–11.0)

## 2015-12-14 LAB — COMPREHENSIVE METABOLIC PANEL
ALT: 12 U/L — ABNORMAL LOW (ref 14–54)
AST: 17 U/L (ref 15–41)
Albumin: 3.7 g/dL (ref 3.5–5.0)
Alkaline Phosphatase: 103 U/L (ref 38–126)
Anion gap: 6 (ref 5–15)
BUN: 15 mg/dL (ref 6–20)
CO2: 30 mmol/L (ref 22–32)
Calcium: 9.1 mg/dL (ref 8.9–10.3)
Chloride: 104 mmol/L (ref 101–111)
Creatinine, Ser: 0.92 mg/dL (ref 0.44–1.00)
GFR calc Af Amer: >60 mL/min (ref >60)
GFR calc non Af Amer: >60 mL/min (ref >60)
Glucose, Bld: 89 mg/dL (ref 65–99)
Potassium: 3.3 mmol/L — ABNORMAL LOW (ref 3.5–5.1)
Sodium: 140 mmol/L (ref 135–145)
Total Bilirubin: 0.3 mg/dL (ref 0.3–1.2)
Total Protein: 7.2 g/dL (ref 6.5–8.1)

## 2015-12-14 LAB — TROPONIN I: Troponin I: <0.03 ng/mL (ref <0.031)

## 2015-12-14 MED ORDER — HYDROMORPHONE HCL 2 MG PO TABS
ORAL_TABLET | ORAL | Status: AC
  Filled 2015-12-14: qty 1

## 2015-12-14 MED ORDER — BARIUM SULFATE 2.1 % PO SUSP
900.0000 mL | Freq: Once | ORAL | Status: AC
  Administered 2015-12-14: 900 mL via ORAL

## 2015-12-14 MED ORDER — ONDANSETRON HCL 4 MG/2ML IJ SOLN
4.0000 mg | Freq: Once | INTRAMUSCULAR | Status: AC
  Administered 2015-12-14: 4 mg via INTRAVENOUS

## 2015-12-14 MED ORDER — SUCRALFATE 1 G PO TABS: 1.0000 g | ORAL_TABLET | Freq: Four times a day (QID) | ORAL | Status: DC

## 2015-12-14 MED ORDER — HYDROMORPHONE HCL 1 MG/ML IJ SOLN
1.0000 mg | Freq: Once | INTRAMUSCULAR | Status: AC
  Administered 2015-12-14: 1 mg via INTRAVENOUS

## 2015-12-14 MED ORDER — HYDROMORPHONE HCL 1 MG/ML IJ SOLN
INTRAMUSCULAR | Status: AC
  Administered 2015-12-14: 1 mg via INTRAVENOUS
  Filled 2015-12-14: qty 1

## 2015-12-14 MED ORDER — HYDROMORPHONE HCL 2 MG PO TABS
2.0000 mg | ORAL_TABLET | Freq: Once | ORAL | Status: AC
  Administered 2015-12-14: 2 mg via ORAL

## 2015-12-14 MED ORDER — ONDANSETRON HCL 4 MG/2ML IJ SOLN
INTRAMUSCULAR | Status: AC
  Administered 2015-12-14: 4 mg via INTRAVENOUS
  Filled 2015-12-14: qty 2

## 2015-12-14 NOTE — ED Notes (Signed)
Pt transported to CT via stretcher.  

## 2015-12-14 NOTE — ED Notes (Signed)
Pt roomed from CT via stretcher.

## 2015-12-14 NOTE — ED Notes (Signed)
Called to CT to let them know pt. Finished 1st bottle and about 1/3 of 2nd bottle, pt unsure if she can finish due to gastric bypass surgery.

## 2015-12-14 NOTE — ED Provider Notes (Signed)
Time Seen: Approximately 2011 I have reviewed the triage notes  Chief Complaint: Abdominal Pain   History of Present Illness: Julie Acevedo is a 64 y.o. female *who states that she has a tender area and points mainly just superior to the umbilicus and slightly to the right. She states she's noticed a "" lump there "" for several months. She states it seems to be worse recently. She states she's been taken Vicodin at home for pain. She has chronic pain from her back. Patient denies any fever, persistent vomiting, loose stool or diarrhea. Patient denies any back or flank pain. She denies any dysuria, hematuria, urinary frequency. She has a significant past history of gastric bypass surgery in 2010 she states she's lost approximately 90 pounds. She states the pain seems to be there "" every day "" it just happened to be worse today.  Past Medical History  Diagnosis Date  . DM (diabetes mellitus) ([place])   . HTN (hypertension)   . Diverticulitis   . Arthritis   . Coronary artery disease   . Stroke ([place])   . Short-term memory loss   . Myocardial infarction ([place])   . Memory difficulties 09/01/2015    Patient Active Problem List   Diagnosis Date Noted  . Memory difficulties 09/01/2015  . Arthritis 06/11/2015  . Back pain, chronic 06/11/2015  . HLD (hyperlipidemia) 06/11/2015  . Decreased potassium in the blood 06/11/2015  . Cannot sleep 06/11/2015  . L-S radiculopathy 06/11/2015  . Arthritis of knee, degenerative 06/11/2015  . B12 deficiency 06/11/2015  . 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 Acute encephalopathy 11/15/2012  . Dysphasia 11/15/2012  . DM2 (diabetes mellitus, type 2) ([place]) 11/15/2012  . DM (diabetes mellitus) ([place]) 01/27/2012  . Chest pain, localized 01/27/2012  . HTN (hypertension) 01/27/2012  . Adiposity 11/21/2009  . Avitaminosis D 11/07/2009  . Narrowing of intervertebral disc space 07/25/2009  .  Diabetes ([place]) 07/25/2009  . Benign essential HTN 07/25/2009    Past Surgical History  Procedure Laterality Date  . Hemicolectomy    . Lumbar disc surgery    . Roux-en-y gastric bypass    . Cholecystectomy    . Eye surgery    . Hand surgery    . Foot surgery    . Abdominal hysterectomy      due to endometriosis-1 ovary left  . Tonsillectomy      Past Surgical History  Procedure Laterality Date  . Hemicolectomy    . Lumbar disc surgery    . Roux-en-y gastric bypass    . Cholecystectomy    . Eye surgery    . Hand surgery    . Foot surgery    . Abdominal hysterectomy      due to endometriosis-1 ovary left  . Tonsillectomy      Current Outpatient Rx  Name  Route  Sig  Dispense  Refill  . amLODipine (NORVASC) 10 MG tablet   Oral   Take 1 tablet (10 mg total) by mouth daily.   90 tablet   2   . diazepam (VALIUM) 5 MG tablet   Oral   Take 1 tablet (5 mg total) by mouth every 6 (six) hours as needed for anxiety. 1/2 tab to 1 tab   100 tablet   5   . donepezil (ARICEPT) 5 MG tablet      TAKE 1 TABLET (5 MG TOTAL) BY MOUTH AT BEDTIME.  30 tablet   0   . hydrochlorothiazide (HYDRODIURIL) 25 MG tablet   Oral   Take 1 tablet (25 mg total) by mouth daily.   90 tablet   2   . HYDROcodone-acetaminophen (NORCO) 10-325 MG per tablet   Oral   Take 1 tablet by mouth every 6 (six) hours as needed for moderate pain.         Marland Kitchen Linaclotide (LINZESS) 145 MCG CAPS capsule   Oral   Take 1 capsule (145 mcg total) by mouth daily. Given as samples   30 capsule   12   . metFORMIN (GLUCOPHAGE) 500 MG tablet   Oral   Take 1 tablet (500 mg total) by mouth 2 (two) times daily with a meal.   180 tablet   2   . Prenatal Vit-Fe Fumarate-FA (PRENATAL MULTIVITAMIN) TABS tablet   Oral   Take 1 tablet by mouth daily.           Allergies:  Lotrel; Contrast media; Niacin; and Sulfa antibiotics  Family History: Family History  Problem Relation Age of Onset  . Cancer  Father 66    Lung  . Coronary artery disease Father 2  . COPD Mother   . Diabetes Mother   . Hypertension Mother   . Cancer Paternal Aunt     Breast  . Cancer Paternal Grandmother     Breast  . Congestive Heart Failure Maternal Grandmother   . Emphysema Maternal Grandfather   . Heart attack Paternal Grandfather     Social History: Social History  Substance Use Topics  . Smoking status: Never Smoker   . Smokeless tobacco: Never Used  . Alcohol Use: No     Review of Systems:   10 point review of systems was performed and was otherwise negative:  Constitutional: No fever Eyes: No visual disturbances ENT: No sore throat, ear pain Cardiac: No chest pain Respiratory: No shortness of breath, wheezing, or stridor Abdomen: Central abdominal pain without radiation, no vomiting, No diarrhea Endocrine: No weight loss, No night sweats Extremities: No peripheral edema, cyanosis Skin: No rashes, easy bruising Neurologic: No focal weakness, trouble with speech or swollowing Urologic: No dysuria, Hematuria, or urinary frequency   Physical Exam:  ED Triage Vitals  Enc Vitals Group     BP 12/14/15 1756 125/64 mmHg     Pulse Rate 12/14/15 1756 77     Resp 12/14/15 1756 20     Temp 12/14/15 1756 98.3 F (36.8 C)     Temp Source 12/14/15 1756 Oral     SpO2 12/14/15 1756 100 %     Weight 12/14/15 1756 147 lb (66.679 kg)     Height 12/14/15 1756 5\' 2"  (1.575 m)     Head Cir --      Peak Flow --      Pain Score 12/14/15 1757 10     Pain Loc --      Pain Edu? --      Excl. in [place]? --     General: Awake , Alert , and Oriented times 3; GCS 15 Head: Normal cephalic , atraumatic Eyes: Pupils equal , round, reactive to light Nose/Throat: No nasal drainage, patent upper airway without erythema or exudate.  Neck: Supple, Full range of motion, No anterior adenopathy or palpable thyroid masses Lungs: Clear to ascultation without wheezes , rhonchi, or rales Heart: Regular rate,  regular rhythm without murmurs , gallops , or rubs Abdomen: Point tender over the area superior to  the umbilicus. No obvious palpable masses. without rebound, guarding , or rigidity; bowel sounds positive and symmetric in all 4 quadrants. No organomegaly .        Extremities: 2 plus symmetric pulses. No edema, clubbing or cyanosis Neurologic: normal ambulation, Motor symmetric without deficits, sensory intact Skin: warm, dry, no rashes   Labs:   All laboratory work was reviewed including any pertinent negatives or positives listed below:  Labs Reviewed  CBC WITH DIFFERENTIAL/PLATELET - Abnormal; Notable for the following:    Hemoglobin 11.3 (*)    HCT 34.5 (*)    RDW 15.6 (*)    All other components within normal limits  COMPREHENSIVE METABOLIC PANEL - Abnormal; Notable for the following:    Potassium 3.3 (*)    ALT 12 (*)    All other components within normal limits  TROPONIN I    EKG:  ED ECG REPORT I, Daymon Larsen, the attending physician, personally viewed and interpreted this ECG.  Date: 12/14/2015 EKG Time: 1806 Rate: 66 Rhythm: normal sinus rhythm QRS Axis: normal Intervals: normal ST/T Wave abnormalities: normal Conduction Disutrbances: none Narrative Interpretation: unremarkable    Radiology:   CLINICAL DATA: 64 year old female with gastric bypass surgery in 2010 presenting with upper abdominal pain.  EXAM: CT ABDOMEN AND PELVIS WITHOUT CONTRAST  TECHNIQUE: Multidetector CT imaging of the abdomen and pelvis was performed following the standard protocol without IV contrast.  COMPARISON: CT dated 01/25/2015  FINDINGS: Evaluation of this exam is limited in the absence of intravenous contrast.  The visualized lung bases are clear. There is mild hypoattenuation of the cardiac blood pool which may represent a degree of anemia. Clinical correlation is recommended. No intra-abdominal free air or free fluid identified.  Cholecystectomy. There is  irregularity of the hepatic contour. Clinical correlation is recommended to evaluate for cirrhosis. There is atrophy of the body of the pancreas. The spleen, and adrenal glands appear unremarkable. There is mild right hydronephrosis. There is mild right renal atrophy. The left kidney appears unremarkable. The visualized ureters and urinary bladder appear unremarkable. Hysterectomy.  There is sigmoid diverticulosis without active inflammation. This postsurgical changes of gastric bypass. There is no evidence of bowel obstruction. Jump mildly thickened appearance of the jejunal loops in the left upper abdomen. Clinical correlation is recommended to evaluate for enteritis. The appendix is not visualized with certainty, possibly surgically absent.  The abdominal aorta and IVC appear grossly unremarkable on noncontrast study. No portal venous gas identified. There is no adenopathy.  Midline vertical anterior pelvic wall incisional scar. Small fat containing supraumbilical hernia as well as small fat containing umbilical hernia. There is a 1 cm peritoneal defect to the right of the surgical scar with herniation of small amount of omental fat. There is no evidence of inflammation. Clinical correlation is recommended to evaluate for strangulation. No fluid collection. Degenerative changes of the spine. L4-5 disc spacer and posterior fixation screws.  IMPRESSION: Mild right hydronephrosis. Correlation with urinalysis recommended to exclude UTI. No definite stone identified.  No evidence of bowel obstruction. Mild thickening of the duodenal bulb. Clinical correlation is recommended to evaluate for enteritis.  Small ventral fat containing hernias without evidence of inflammation. Clinical correlation is recommended.   Electronically Signed    I personally reviewed the radiologic studies     ED Course: Patient's stay here was uneventful and she was symptomatically improved with  some IV Dilaudid and Zofran. She does have evidence of hernias though none seemed to be obstructive in  nature. Given the nature of her presentation and her clinical findings I felt these were some developing abdominal hernias that at this point were not strangulated or incarcerated. She has had previous gastric bypass surgery and she was referred back to her original Psychologist, sport and exercise. She's been advised drink plenty of fluids and continue with her pain medication at home and I added some Carafate due to some CT evidence of some mild duodenitis.   Assessment: Abdominal hernia      Plan:  Outpatient management Patient was advised to return immediately if condition worsens. Patient was advised to follow up with their primary care physician or other specialized physicians involved in their outpatient care            Daymon Larsen, MD 12/14/15 2346

## 2015-12-14 NOTE — Discharge Instructions (Signed)
Hernia, Adult A hernia is the bulging of an organ or tissue through a weak spot in the muscles of the abdomen (abdominal wall). Hernias develop most often near the navel or groin. There are many kinds of hernias. Common kinds include:  Femoral hernia. This kind of hernia develops under the groin in the upper thigh area.  Inguinal hernia. This kind of hernia develops in the groin or scrotum.  Umbilical hernia. This kind of hernia develops near the navel.  Hiatal hernia. This kind of hernia causes part of the stomach to be pushed up into the chest.  Incisional hernia. This kind of hernia bulges through a scar from an abdominal surgery. CAUSES This condition may be caused by:  Heavy lifting.  Coughing over a long period of time.  Straining to have a bowel movement.  An incision made during an abdominal surgery.  A birth defect (congenital defect).  Excess weight or obesity.  Smoking.  Poor nutrition.  Cystic fibrosis.  Excess fluid in the abdomen.  Undescended testicles. SYMPTOMS Symptoms of a hernia include:  A lump on the abdomen. This is the first sign of a hernia. The lump may become more obvious with standing, straining, or coughing. It may get bigger over time if it is not treated or if the condition causing it is not treated.  Pain. A hernia is usually painless, but it may become painful over time if treatment is delayed. The pain is usually dull and may get worse with standing or lifting heavy objects. Sometimes a hernia gets tightly squeezed in the weak spot (strangulated) or stuck there (incarcerated) and causes additional symptoms. These symptoms may include:  Vomiting.  Nausea.  Constipation.  Irritability. DIAGNOSIS A hernia may be diagnosed with:  A physical exam. During the exam your health care provider may ask you to cough or to make a specific movement, because a hernia is usually more visible when you move.  Imaging tests. These can  include:  X-rays.  Ultrasound.  CT scan. TREATMENT A hernia that is small and painless may not need to be treated. A hernia that is large or painful may be treated with surgery. Inguinal hernias may be treated with surgery to prevent incarceration or strangulation. Strangulated hernias are always treated with surgery, because lack of blood to the trapped organ or tissue can cause it to die. Surgery to treat a hernia involves pushing the bulge back into place and repairing the weak part of the abdomen. HOME CARE INSTRUCTIONS  Avoid straining.  Do not lift anything heavier than 10 lb (4.5 kg).  Lift with your leg muscles, not your back muscles. This helps avoid strain.  When coughing, try to cough gently.  Prevent constipation. Constipation leads to straining with bowel movements, which can make a hernia worse or cause a hernia repair to break down. You can prevent constipation by:  Eating a high-fiber diet that includes plenty of fruits and vegetables.  Drinking enough fluids to keep your urine clear or pale yellow. Aim to drink 6-8 glasses of water per day.  Using a stool softener as directed by your health care provider.  Lose weight, if you are overweight.  Do not use any tobacco products, including cigarettes, chewing tobacco, or electronic cigarettes. If you need help quitting, ask your health care provider.  Keep all follow-up visits as directed by your health care provider. This is important. Your health care provider may need to monitor your condition. SEEK MEDICAL CARE IF:  You have  swelling, redness, and pain in the affected area.  Your bowel habits change. SEEK IMMEDIATE MEDICAL CARE IF:  You have a fever.  You have abdominal pain that is getting worse.  You feel nauseous or you vomit.  You cannot push the hernia back in place by gently pressing on it while you are lying down.  The hernia:  Changes in shape or size.  Is stuck outside the  abdomen.  Becomes discolored.  Feels hard or tender.   This information is not intended to replace advice given to you by your health care provider. Make sure you discuss any questions you have with your health care provider.   Document Released: 12/03/2005 Document Revised: 12/24/2014 Document Reviewed: 10/13/2014 Elsevier Interactive Patient Education [employer].  Please return immediately if condition worsens. Please contact her primary physician or the physician you were given for referral. If you have any specialist physicians involved in her treatment and plan please also contact them. Thank you for using [facility].

## 2015-12-14 NOTE — ED Notes (Signed)
States has had mass above her navel for awhile,, worse today,normal bm, no nausea

## 2015-12-14 NOTE — ED Notes (Signed)
Took pt. To the bathroom.

## 2015-12-14 NOTE — ED Notes (Signed)
CT notified of pt's allergy to IV contrast.

## 2015-12-14 NOTE — ED Notes (Addendum)
Pt had gastric bipass surgery in 2010. Pt states she has been having abdominal pain for 2 months but pain has become unbearable today. Pt has an allergy to IV contrast dye, but oral contrast is okay.

## 2015-12-14 NOTE — ED Notes (Signed)
Palpable mass felt above navel, very tender, dr Brain Hilts aware

## 2015-12-18 DIAGNOSIS — Z8614 Personal history of Methicillin resistant Staphylococcus aureus infection: Secondary | ICD-10-CM

## 2015-12-18 HISTORY — DX: Personal history of Methicillin resistant Staphylococcus aureus infection: Z86.14

## 2015-12-21 ENCOUNTER — Ambulatory Visit: Payer: Self-pay | Admitting: Family Medicine

## 2015-12-26 ENCOUNTER — Ambulatory Visit: Payer: Self-pay | Admitting: Family Medicine

## 2015-12-28 ENCOUNTER — Ambulatory Visit: Payer: Self-pay | Admitting: Family Medicine

## 2016-01-12 HISTORY — PX: UPPER GI ENDOSCOPY: SHX6162

## 2016-01-22 IMAGING — CT CT HEAD WITHOUT CONTRAST
1 of 2 series · 13 of 30 positions shown, 17 images · non-contrast
Comparison: 03/23/2007

CLINICAL DATA: Found down in [REDACTED], confused

EXAM:
CT HEAD WITHOUT CONTRAST
TECHNIQUE: Contiguous axial images were obtained from the base of the skull
through the vertex without intravenous contrast.

[Series 2: soft tissue · axial · 0.44mm/px · z∈[+268,+388]mm · 13 of 28 slices shown, 17 images]
[im 2/28  brain]
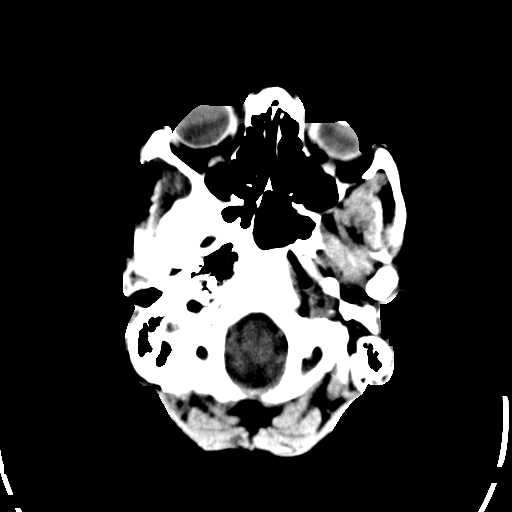
[im 2/28  bone]
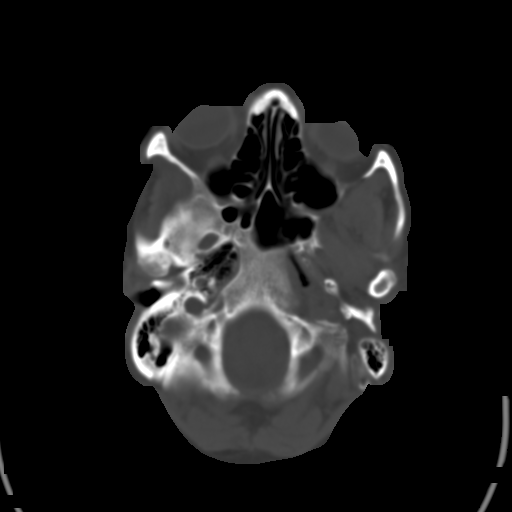
[im 4/28  brain]
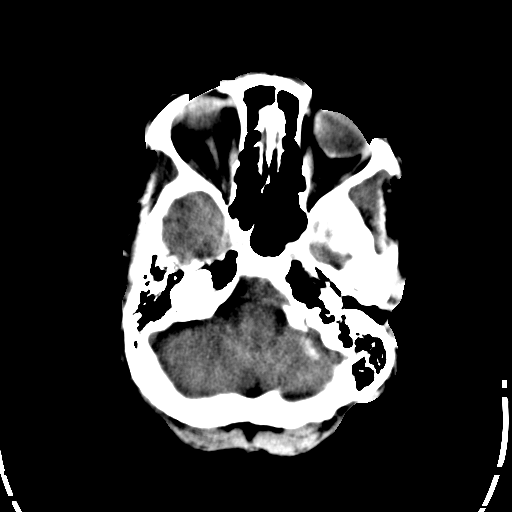
[im 6/28  brain]
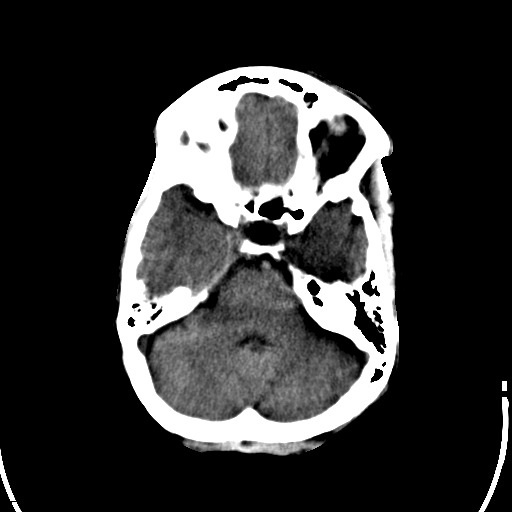
[im 8/28  brain]
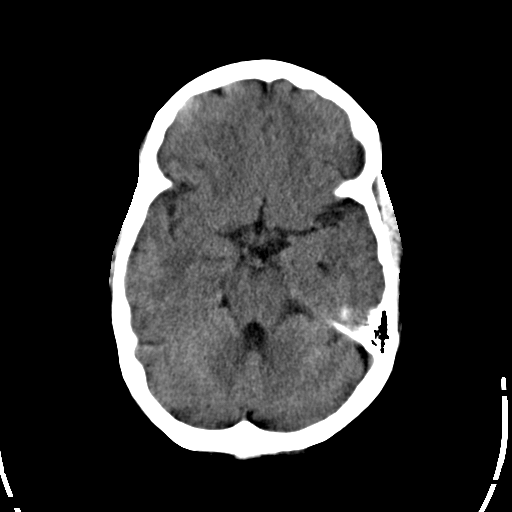
[im 10/28  brain]
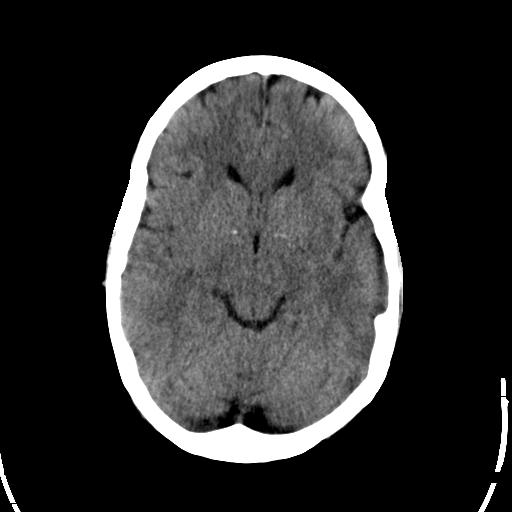
[im 10/28  bone]
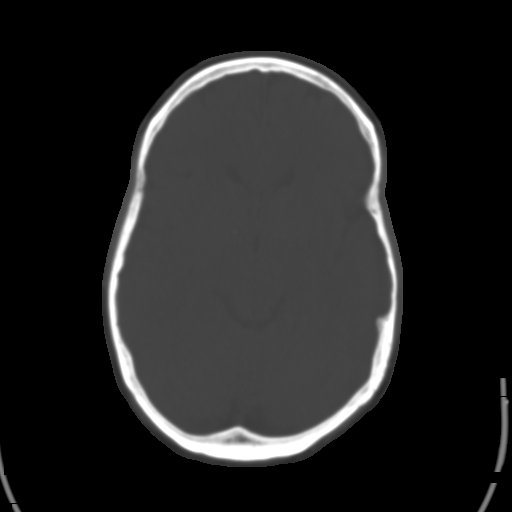
[im 12/28  brain]
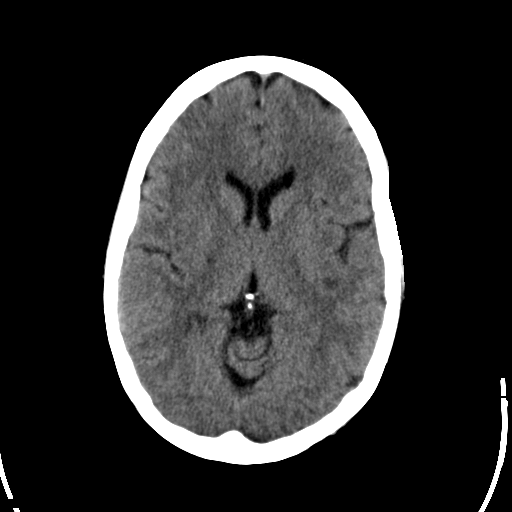
[im 14/28  brain]
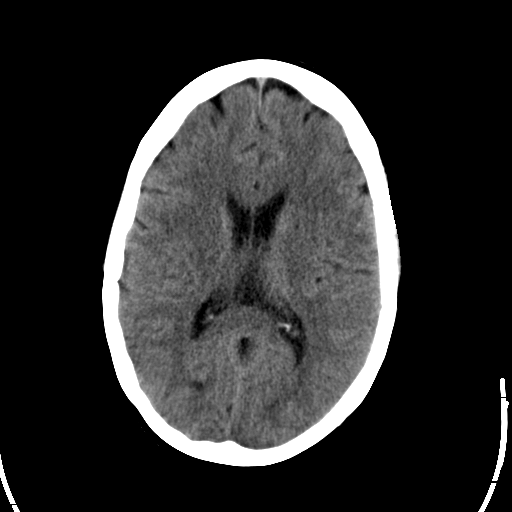
[im 16/28  brain]
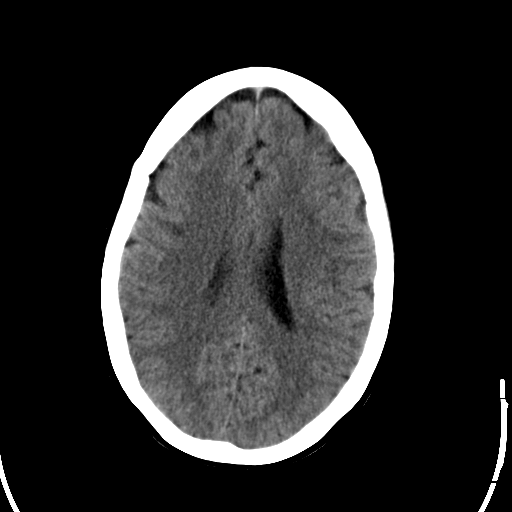
[im 18/28  brain]
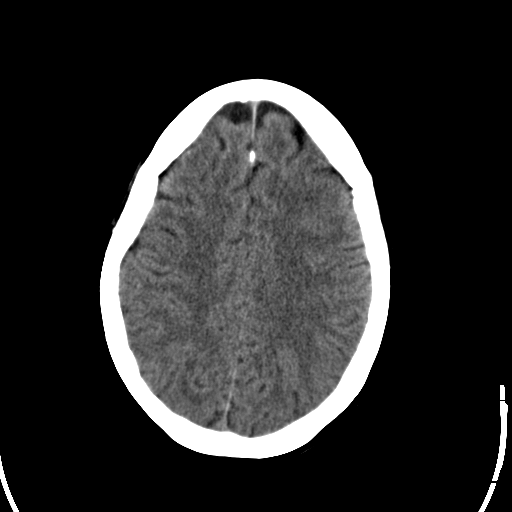
[im 18/28  bone]
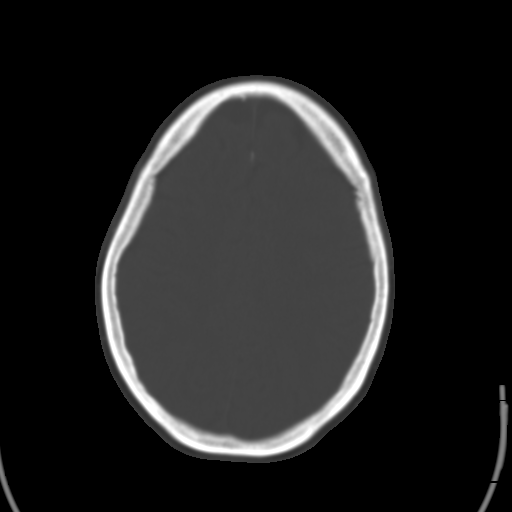
[im 20/28  brain]
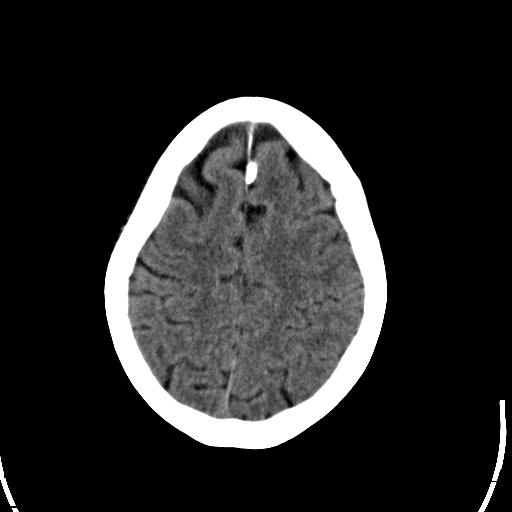
[im 22/28  brain]
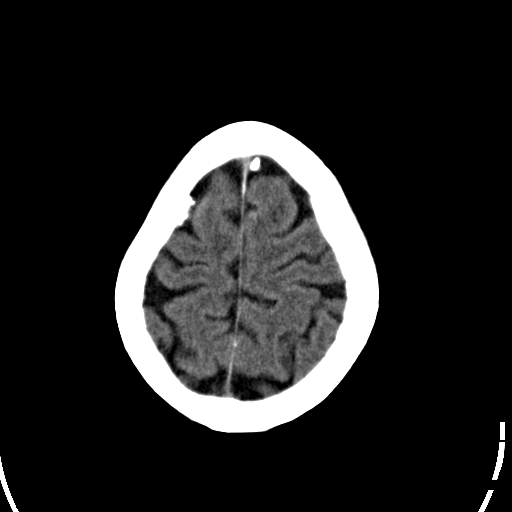
[im 24/28  brain]
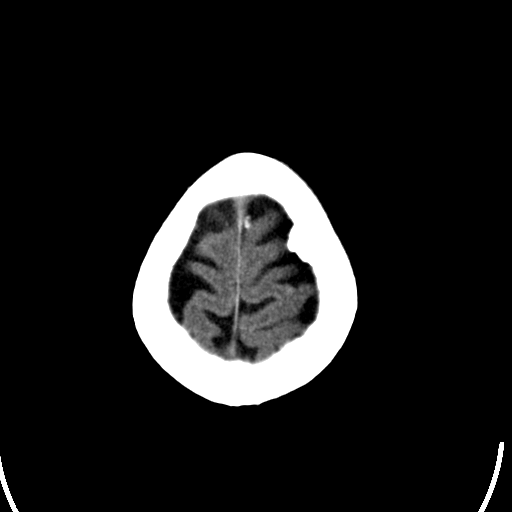
[im 26/28  brain]
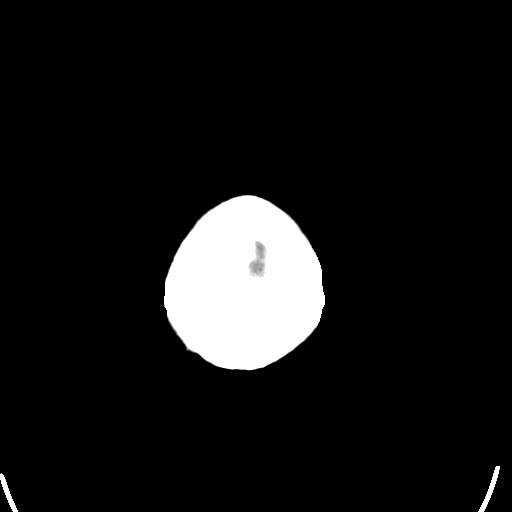
[im 26/28  bone]
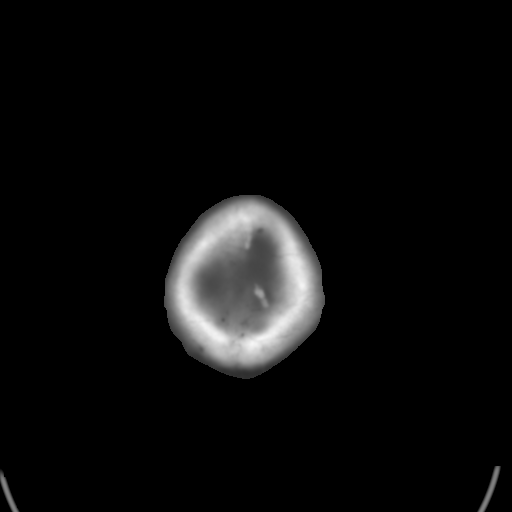

[13 of 30 positions shown; findings below may reference images not displayed]

FINDINGS: No mass lesion. No midline shift. No acute hemorrhage or hematoma.
No extra-axial fluid collections. No evidence of acute infarction.
Calvarium is intact
IMPRESSION: Negative

## 2016-01-22 IMAGING — CR DG CHEST 1V PORT
1 series · 1 of 1 positions shown · non-contrast
Comparison: None.

CLINICAL DATA: Patient found unresponsive.

EXAM:
PORTABLE CHEST - 1 VIEW

[ap]
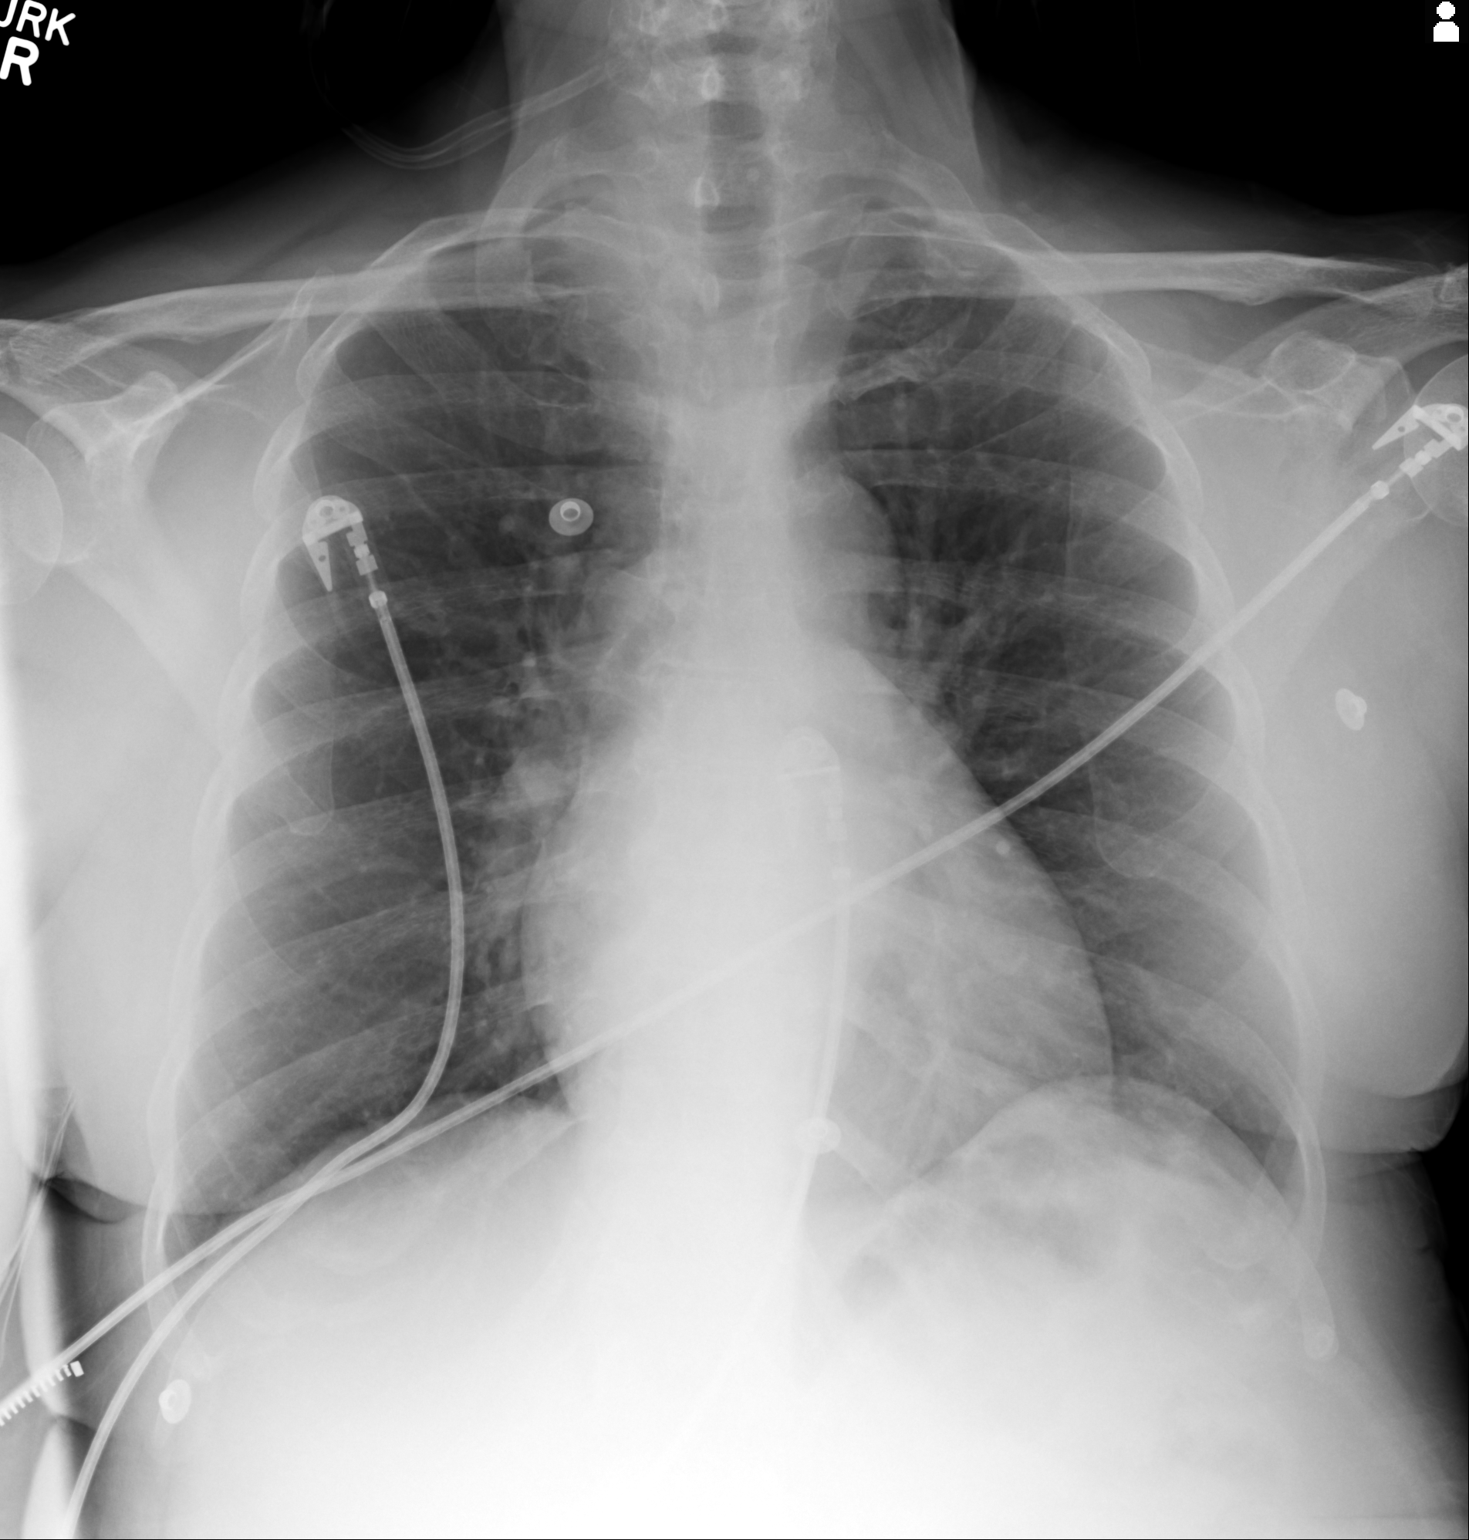

[1 of 1 positions shown; findings below may reference images not displayed]

FINDINGS: Heart size and mediastinal contours are within normal limits. Both
lungs are clear. Visualized skeletal structures are unremarkable.
IMPRESSION: Negative exam.

## 2016-01-26 ENCOUNTER — Encounter: Payer: Self-pay | Admitting: Family Medicine

## 2016-02-08 ENCOUNTER — Other Ambulatory Visit: Payer: Self-pay | Admitting: Family Medicine

## 2016-02-21 ENCOUNTER — Ambulatory Visit (INDEPENDENT_AMBULATORY_CARE_PROVIDER_SITE_OTHER): Payer: Medicare PPO | Admitting: Family Medicine

## 2016-02-21 VITALS — BP 108/66 | HR 76 | Resp 12 | Wt 155.0 lb

## 2016-02-21 DIAGNOSIS — M5416 Radiculopathy, lumbar region: Secondary | ICD-10-CM

## 2016-02-21 MED ORDER — GABAPENTIN 100 MG PO CAPS: 100.0000 mg | ORAL_CAPSULE | Freq: Three times a day (TID) | ORAL | Status: DC

## 2016-02-21 MED ORDER — HYDROCODONE-ACETAMINOPHEN 10-325 MG PO TABS: 1.0000 | ORAL_TABLET | ORAL | Status: DC | PRN

## 2016-02-21 MED ORDER — PREDNISONE 10 MG (48) PO TBPK: ORAL_TABLET | Freq: Every day | ORAL | Status: DC

## 2016-02-21 NOTE — Progress Notes (Signed)
Patient ID: Julie Acevedo, female   DOB: 01-24-1951, 65 y.o.   MRN: [id number]    Subjective:  HPI  Patient states her chronic back pain is getting worse. Pain intensity has increased, this has been an issue for about 3 to 4 months. Pain is located on the right side of the lower back to the right hip and down the right leg. Pain is constant. Some numbness present in the leg.  We did MRI of the spine in October 2015 and impression was at that time: IMPRESSION:  Postoperative changes at L4-5 with posterior and interbody fusion.  No complicating features. No spinal or foraminal stenosis.   Mild annular bulge and shallow central disc protrusion at L2-3 but  no neural compression, spinal or foraminal stenosis.   Annular fissure at L5-S1 and mild annular bulge but no spinal or  foraminal stenosis.   Dilated nerve root sheath on the right at T12-L1.   Patient wants to discuss referral. Patient has not been taking Norco in months. She has taking Advil but it does not help. She hurts all the time.  Prior to Admission medications   Medication Sig Start Date End Date Taking? Authorizing Provider  amLODipine (NORVASC) 10 MG tablet Take 1 tablet (10 mg total) by mouth daily. 11/03/15   Sereen Schaff Maceo Pro., MD  diazepam (VALIUM) 5 MG tablet TAKE 1 TABLET BY MOUTH EVERY 6 HOURS AS NEEDED FOR ANXIETY CAN TAKE 1/2-1 TABLET 02/08/16   Malone Admire Maceo Pro., MD  donepezil (ARICEPT) 5 MG tablet TAKE 1 TABLET (5 MG TOTAL) BY MOUTH AT BEDTIME. 11/04/15   Kathrynn Ducking, MD  hydrochlorothiazide (HYDRODIURIL) 25 MG tablet Take 1 tablet (25 mg total) by mouth daily. 11/03/15   Caylon Saine Maceo Pro., MD  HYDROcodone-acetaminophen (NORCO) 10-325 MG per tablet Take 1 tablet by mouth every 6 (six) hours as needed for moderate pain.    Historical Provider, MD  Linaclotide [facility]) 145 MCG CAPS capsule Take 1 capsule (145 mcg total) by mouth daily. Given as samples 08/11/15   Jerrol Banana., MD    metFORMIN (GLUCOPHAGE) 500 MG tablet Take 1 tablet (500 mg total) by mouth 2 (two) times daily with a meal. 11/03/15   Jerrol Banana., MD  Prenatal Vit-Fe Fumarate-FA (PRENATAL MULTIVITAMIN) TABS tablet Take 1 tablet by mouth daily.    Historical Provider, MD  sucralfate (CARAFATE) 1 g tablet Take 1 tablet (1 g total) by mouth 4 (four) times daily. 12/14/15 12/13/16  Daymon Larsen, MD    Patient Active Problem List   Diagnosis Date Noted  . Memory difficulties 09/01/2015  . Arthritis 06/11/2015  . Back pain, chronic 06/11/2015  . HLD (hyperlipidemia) 06/11/2015  . Decreased potassium in the blood 06/11/2015  . Cannot sleep 06/11/2015  . L-S radiculopathy 06/11/2015  . Arthritis of knee, degenerative 06/11/2015  . B12 deficiency 06/11/2015  . 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 Acute encephalopathy 11/15/2012  . Dysphasia 11/15/2012  . DM2 (diabetes mellitus, type 2) ([place]) 11/15/2012  . DM (diabetes mellitus) ([place]) 01/27/2012  . Chest pain, localized 01/27/2012  . HTN (hypertension) 01/27/2012  . Adiposity 11/21/2009  . Avitaminosis D 11/07/2009  . Narrowing of intervertebral disc space 07/25/2009  . Diabetes ([place]) 07/25/2009  . Benign essential HTN 07/25/2009    Past Medical History  Diagnosis Date  . DM (diabetes mellitus) ([place])   . HTN (hypertension)   .  Diverticulitis   . Arthritis   . Coronary artery disease   . Stroke ([place])   . Short-term memory loss   . Myocardial infarction ([place])   . Memory difficulties 09/01/2015    Social History   Social History  . Marital Status: Married    Spouse Name: N/A  . Number of Children: 3  . 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 Smoking status: Never Smoker   . Smokeless tobacco: Never Used  . Alcohol Use: No  . Drug Use: No  . Sexual Activity: Not on file   Other Topics Concern  . Not on  file   Social History Narrative   Lives with husband, daughter and granddaughter.;   Patient drinks about 3 cups of caffeine daily.   Patient is left handed.     Allergies  Allergen Reactions  . Lotrel [Amlodipine Besy-Benazepril Hcl] Anaphylaxis  . Contrast Media [Iodinated Diagnostic Agents] Swelling and Other (See Comments)    Pt states that it causes her neck to swell.   . Niacin Hives  . Sulfa Antibiotics Hives    Review of Systems  Respiratory: Negative.   Cardiovascular: Negative.   Gastrointestinal: Positive for abdominal pain.  Musculoskeletal: Positive for back pain and joint pain.  Neurological: Positive for tingling and weakness.    Immunization History  Administered Date(s) Administered  . Influenza-Unspecified 09/15/2015  . Pneumococcal Polysaccharide-23 11/17/2012  . Tdap 10/02/2011   Objective:  BP 108/66 mmHg  Pulse 76  Resp 12  Wt 155 lb (70.308 kg)  Physical Exam  Constitutional: She is oriented to person, place, and time and well-developed, well-nourished, and in no distress.  HENT:  Head: Normocephalic and atraumatic.  Right Ear: External ear normal.  Left Ear: External ear normal.  Nose: Nose normal.  Eyes: Conjunctivae are normal.  Neck: Neck supple.  Cardiovascular: Normal rate, regular rhythm, normal heart sounds and intact distal pulses.   No murmur heard. Pulmonary/Chest: Effort normal and breath sounds normal. No respiratory distress. She has no wheezes.  Abdominal: Soft.  Neurological: She is alert and oriented to person, place, and time. She displays abnormal reflex (decreased sensation on the right, left knee normal.).  Weak in the right leg  Skin: Skin is warm and dry.  Psychiatric: Mood, memory, affect and judgment normal.    Lab Results  Component Value Date   WBC 6.3 12/14/2015   HGB 11.3* 12/14/2015   HCT 34.5* 12/14/2015   PLT 356 12/14/2015   GLUCOSE 89 12/14/2015   CHOL 154 01/18/2014   TRIG 53 01/18/2014   HDL  74* 01/18/2014   LDLCALC 69 01/18/2014   TSH 2.68 01/18/2014   INR 1.02 10/04/2014   HGBA1C 6.0 07/05/2015    CMP     Component Value Date/Time   NA 140 12/14/2015 1759   NA 140 02/17/2014 1843   NA 142 01/21/2014   K 3.3* 12/14/2015 1759   K 2.9* 02/17/2014 1843   CL 104 12/14/2015 1759   CL 106 02/17/2014 1843   CO2 30 12/14/2015 1759   CO2 29 02/17/2014 1843   GLUCOSE 89 12/14/2015 1759   GLUCOSE 95 02/17/2014 1843   BUN 15 12/14/2015 1759   BUN 13 02/17/2014 1843   BUN 10 01/21/2014   CREATININE 0.92 12/14/2015 1759   CREATININE 0.87 02/17/2014 1843   CREATININE 0.9 01/21/2014   CALCIUM 9.1 12/14/2015 1759   CALCIUM 9.4 02/17/2014  1843   PROT 7.2 12/14/2015 1759   PROT 7.5 01/17/2014 1945   ALBUMIN 3.7 12/14/2015 1759   ALBUMIN 3.5 01/17/2014 1945   AST 17 12/14/2015 1759   AST 20 01/17/2014 1945   ALT 12* 12/14/2015 1759   ALT 21 01/17/2014 1945   ALKPHOS 103 12/14/2015 1759   ALKPHOS 116 01/17/2014 1945   BILITOT 0.3 12/14/2015 1759   BILITOT 0.1* 01/17/2014 1945   GFRNONAA >60 12/14/2015 1759   GFRNONAA >60 02/17/2014 1843   GFRAA >60 12/14/2015 1759   GFRAA >60 02/17/2014 1843    Assessment and Plan :  1. Lumbar radiculopathy Will start medication. Will set up MRI. Will re check in 1 week. May need referral. - gabapentin (NEURONTIN) 100 MG capsule; Take 1 capsule (100 mg total) by mouth 3 (three) times daily.  Dispense: 90 capsule; Refill: 3 - predniSONE (STERAPRED UNI-PAK 48 TAB) 10 MG (48) TBPK tablet; Take by mouth daily. As directed  Dispense: 48 tablet; Refill: 0 - HYDROcodone-acetaminophen (NORCO) 10-325 MG tablet; Take 1 tablet by mouth every 4 (four) hours as needed for moderate pain.  Dispense: 150 tablet; Refill: 0 - MR Lumbar Spine Wo Contrast; Future 2.Osteoarthritis Patient has chronic pain. I have done the exam and reviewed the above chart and it is accurate to the best of my knowledge.   Miguel Aschoff MD [facility] 02/21/2016 3:37 PM

## 2016-02-21 NOTE — Patient Instructions (Signed)
Take Gabapentin 1 tablet once daily for 1 week, then 2 at night for a week and then 1 tablet in the morning and 2 tablets in the evening after that.

## 2016-02-22 ENCOUNTER — Ambulatory Visit
Admission: RE | Admit: 2016-02-22 | Discharge: 2016-02-22 | Disposition: A | Discharge status: Home / Self Care | Payer: Medicare PPO | Source: Ambulatory Visit | Attending: Family Medicine | Admitting: Family Medicine

## 2016-02-22 ENCOUNTER — Telehealth: Payer: Self-pay

## 2016-02-22 DIAGNOSIS — M5186 Other intervertebral disc disorders, lumbar region: Secondary | ICD-10-CM | POA: Insufficient documentation

## 2016-02-22 DIAGNOSIS — Z981 Arthrodesis status: Secondary | ICD-10-CM | POA: Diagnosis not present

## 2016-02-22 DIAGNOSIS — M5416 Radiculopathy, lumbar region: Secondary | ICD-10-CM | POA: Insufficient documentation

## 2016-02-22 DIAGNOSIS — M4806 Spinal stenosis, lumbar region: Secondary | ICD-10-CM | POA: Diagnosis not present

## 2016-02-22 DIAGNOSIS — R531 Weakness: Secondary | ICD-10-CM | POA: Diagnosis present

## 2016-02-22 DIAGNOSIS — G8929 Other chronic pain: Secondary | ICD-10-CM | POA: Diagnosis present

## 2016-02-22 NOTE — Telephone Encounter (Signed)
Patient reports that the pain medication that was prescribed yesterday is making her nauseated. Patient is requesting something for nausea. She is aware that Dr. Rosanna Randy is out of the office the rest of the week. Patient uses [facility] on [address]

## 2016-02-23 MED ORDER — PROMETHAZINE HCL 12.5 MG PO TABS: 12.5000 mg | ORAL_TABLET | Freq: Three times a day (TID) | ORAL | Status: DC | PRN

## 2016-02-23 NOTE — Telephone Encounter (Signed)
Will get a little medication for nausea but must remember this can cause her to be sleepy. If she remembers to take there medication with food (at meals), she would be less likely to have the nausea.

## 2016-02-23 NOTE — Telephone Encounter (Signed)
Advised patient as below.  

## 2016-02-28 ENCOUNTER — Ambulatory Visit (INDEPENDENT_AMBULATORY_CARE_PROVIDER_SITE_OTHER): Payer: Medicare PPO | Admitting: Family Medicine

## 2016-02-28 ENCOUNTER — Encounter: Payer: Self-pay | Admitting: Family Medicine

## 2016-02-28 ENCOUNTER — Telehealth: Payer: Self-pay | Admitting: Family Medicine

## 2016-02-28 VITALS — BP 122/60 | HR 86 | Temp 98.4°F | Resp 16 | Wt 158.0 lb

## 2016-02-28 DIAGNOSIS — E118 Type 2 diabetes mellitus with unspecified complications: Secondary | ICD-10-CM

## 2016-02-28 DIAGNOSIS — M5416 Radiculopathy, lumbar region: Secondary | ICD-10-CM

## 2016-02-28 LAB — POCT GLYCOSYLATED HEMOGLOBIN (HGB A1C): Hemoglobin A1C: 5.7

## 2016-02-28 MED ORDER — OXYCODONE HCL 5 MG PO TABS: 5.0000 mg | ORAL_TABLET | ORAL | Status: DC | PRN

## 2016-02-28 NOTE — Progress Notes (Signed)
Patient ID: Julie Acevedo, female   DOB: Jan 17, 1951, 65 y.o.   MRN: [id number]    Subjective:  HPI  Pt is here for a 1 week follow up for lumbar radiculopathy. Neurontin, Prednisone and Norco. Pt reports that she is about the same as far as pain goes. She reports that the Norco helps for a couple of hours then it comes back. She had her MRI and wants to discuss results.    Prior to Admission medications   Medication Sig Start Date End Date Taking? Authorizing Provider  amLODipine (NORVASC) 10 MG tablet Take 1 tablet (10 mg total) by mouth daily. 11/03/15  Yes Athan Casalino Maceo Pro., MD  diazepam (VALIUM) 5 MG tablet TAKE 1 TABLET BY MOUTH EVERY 6 HOURS AS NEEDED FOR ANXIETY CAN TAKE 1/2-1 TABLET 02/08/16  Yes Seriyah Collison Maceo Pro., MD  donepezil (ARICEPT) 5 MG tablet TAKE 1 TABLET (5 MG TOTAL) BY MOUTH AT BEDTIME. 11/04/15  Yes Kathrynn Ducking, MD  gabapentin (NEURONTIN) 100 MG capsule Take 1 capsule (100 mg total) by mouth 3 (three) times daily. 02/21/16  Yes Kharisma Glasner Maceo Pro., MD  hydrochlorothiazide (HYDRODIURIL) 25 MG tablet Take 1 tablet (25 mg total) by mouth daily. 11/03/15  Yes Brax Walen Maceo Pro., MD  HYDROcodone-acetaminophen [facility]) 10-325 MG tablet Take 1 tablet by mouth every 4 (four) hours as needed for moderate pain. 02/21/16  Yes Colt Martelle Maceo Pro., MD  Linaclotide [facility]) 145 MCG CAPS capsule Take 1 capsule (145 mcg total) by mouth daily. Given as samples 08/11/15  Yes Abb Gobert Maceo Pro., MD  metFORMIN (GLUCOPHAGE) 500 MG tablet Take 1 tablet (500 mg total) by mouth 2 (two) times daily with a meal. 11/03/15  Yes Jerrol Banana., MD  predniSONE (STERAPRED UNI-PAK 48 TAB) 10 MG (48) TBPK tablet Take by mouth daily. As directed 02/21/16  Yes Lucita Montoya Maceo Pro., MD  Prenatal Vit-Fe Fumarate-FA (PRENATAL MULTIVITAMIN) TABS tablet Take 1 tablet by mouth daily.   Yes Historical Provider, MD  promethazine (PHENERGAN) 12.5 MG tablet Take 1 tablet (12.5 mg total) by mouth  every 8 (eight) hours as needed for nausea or vomiting. 02/23/16  Yes Margo Common, PA    Patient Active Problem List   Diagnosis Date Noted  . Memory difficulties 09/01/2015  . Arthritis 06/11/2015  . Back pain, chronic 06/11/2015  . HLD (hyperlipidemia) 06/11/2015  . Decreased potassium in the blood 06/11/2015  . Cannot sleep 06/11/2015  . L-S radiculopathy 06/11/2015  . Arthritis of knee, degenerative 06/11/2015  . B12 deficiency 06/11/2015  . 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 Acute encephalopathy 11/15/2012  . Dysphasia 11/15/2012  . DM2 (diabetes mellitus, type 2) ([place]) 11/15/2012  . DM (diabetes mellitus) ([place]) 01/27/2012  . Chest pain, localized 01/27/2012  . HTN (hypertension) 01/27/2012  . Adiposity 11/21/2009  . Avitaminosis D 11/07/2009  . Narrowing of intervertebral disc space 07/25/2009  . Diabetes ([place]) 07/25/2009  . Benign essential HTN 07/25/2009    Past Medical History  Diagnosis Date  . DM (diabetes mellitus) ([place])   . HTN (hypertension)   . Diverticulitis   . Arthritis   . Coronary artery disease   . Stroke ([place])   . Short-term memory loss   . Myocardial infarction ([place])   . Memory difficulties 09/01/2015    Social History   Social History  . Marital Status: Married    Spouse Name: N/A  .  Number of Children: 3  . 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 Smoking status: Never Smoker   . Smokeless tobacco: Never Used  . Alcohol Use: No  . Drug Use: No  . Sexual Activity: Not on file   Other Topics Concern  . Not on file   Social History Narrative   Lives with husband, daughter and granddaughter.;   Patient drinks about 3 cups of caffeine daily.   Patient is left handed.     Allergies  Allergen Reactions  . Lotrel [Amlodipine Besy-Benazepril Hcl] Anaphylaxis  . Contrast Media [Iodinated Diagnostic Agents]  Swelling and Other (See Comments)    Pt states that it causes her neck to swell.   . Niacin Hives  . Sulfa Antibiotics Hives    Review of Systems  HENT: Negative.   Eyes: Negative.   Respiratory: Negative.   Cardiovascular: Negative.   Gastrointestinal: Negative.   Genitourinary: Negative.   Musculoskeletal: Positive for back pain.  Skin: Negative.   Neurological: Negative.   Endo/Heme/Allergies: Negative.   Psychiatric/Behavioral: Negative.     Immunization History  Administered Date(s) Administered  . Influenza-Unspecified 09/15/2015  . Pneumococcal Polysaccharide-23 11/17/2012  . Tdap 10/02/2011   Objective:  BP 122/60 mmHg  Pulse 86  Temp(Src) 98.4 F (36.9 C) (Oral)  Resp 16  Wt 158 lb (71.668 kg)  Physical Exam  Constitutional: She is oriented to person, place, and time and well-developed, well-nourished, and in no distress.  HENT:  Head: Normocephalic and atraumatic.  Right Ear: External ear normal.  Left Ear: External ear normal.  Nose: Nose normal.  Eyes: Conjunctivae are normal.  Neck: Neck supple.  Cardiovascular: Normal rate, regular rhythm and normal heart sounds.   Pulmonary/Chest: Effort normal and breath sounds normal.  Abdominal: Soft.  Neurological: She is alert and oriented to person, place, and time.  Skin: Skin is warm and dry.  Psychiatric: Mood, memory, affect and judgment normal.    Lab Results  Component Value Date   WBC 6.3 12/14/2015   HGB 11.3* 12/14/2015   HCT 34.5* 12/14/2015   PLT 356 12/14/2015   GLUCOSE 89 12/14/2015   CHOL 154 01/18/2014   TRIG 53 01/18/2014   HDL 74* 01/18/2014   LDLCALC 69 01/18/2014   TSH 2.68 01/18/2014   INR 1.02 10/04/2014   HGBA1C 6.0 07/05/2015    CMP     Component Value Date/Time   NA 140 12/14/2015 1759   NA 140 02/17/2014 1843   NA 142 01/21/2014   K 3.3* 12/14/2015 1759   K 2.9* 02/17/2014 1843   CL 104 12/14/2015 1759   CL 106 02/17/2014 1843   CO2 30 12/14/2015 1759   CO2 29  02/17/2014 1843   GLUCOSE 89 12/14/2015 1759   GLUCOSE 95 02/17/2014 1843   BUN 15 12/14/2015 1759   BUN 13 02/17/2014 1843   BUN 10 01/21/2014   CREATININE 0.92 12/14/2015 1759   CREATININE 0.87 02/17/2014 1843   CREATININE 0.9 01/21/2014   CALCIUM 9.1 12/14/2015 1759   CALCIUM 9.4 02/17/2014 1843   PROT 7.2 12/14/2015 1759   PROT 7.5 01/17/2014 1945   ALBUMIN 3.7 12/14/2015 1759   ALBUMIN 3.5 01/17/2014 1945   AST 17 12/14/2015 1759   AST 20 01/17/2014 1945   ALT 12* 12/14/2015 1759   ALT 21 01/17/2014 1945   ALKPHOS 103 12/14/2015 1759   ALKPHOS 116 01/17/2014 1945   BILITOT  0.3 12/14/2015 1759   BILITOT 0.1* 01/17/2014 1945   GFRNONAA >60 12/14/2015 1759   GFRNONAA >60 02/17/2014 1843   GFRAA >60 12/14/2015 1759   GFRAA >60 02/17/2014 1843    Assessment and Plan :  1. Lumbar radiculopathy I do not think this appears to be a surgical problem by MRI. We'll let Dr. Sharlet Salina   evaluate this. - Ambulatory referral to Orthopedic Surgery- Dr. Sharlet Salina 2. Osteoarthritis 3. Hypertension 4. Hyperlipidemia 5. Prediabetes I have done the exam and reviewed the above chart and it is accurate to the best of my knowledge.  2. Type 2 diabetes mellitus with complication, without long-term current use of insulin (HCC)  - POCT HgB A1C--   Miguel Aschoff MD [facility] 02/28/2016 2:27 PM

## 2016-02-29 ENCOUNTER — Ambulatory Visit: Payer: Medicare PPO | Admitting: Adult Health

## 2016-04-23 ENCOUNTER — Inpatient Hospital Stay
Admission: EM | Admit: 2016-04-23 | Discharge: 2016-04-25 | DRG: 390 | Disposition: A | Discharge status: Home / Self Care | Payer: Medicare PPO | Attending: Specialist | Admitting: Specialist

## 2016-04-23 ENCOUNTER — Encounter: Payer: Self-pay | Admitting: Emergency Medicine

## 2016-04-23 ENCOUNTER — Emergency Department: Payer: Medicare PPO

## 2016-04-23 ENCOUNTER — Ambulatory Visit: Payer: Medicare PPO | Admitting: Family Medicine

## 2016-04-23 ENCOUNTER — Telehealth: Payer: Self-pay

## 2016-04-23 DIAGNOSIS — Z90710 Acquired absence of both cervix and uterus: Secondary | ICD-10-CM | POA: Diagnosis not present

## 2016-04-23 DIAGNOSIS — Z833 Family history of diabetes mellitus: Secondary | ICD-10-CM

## 2016-04-23 DIAGNOSIS — Z8249 Family history of ischemic heart disease and other diseases of the circulatory system: Secondary | ICD-10-CM

## 2016-04-23 DIAGNOSIS — K56600 Partial intestinal obstruction, unspecified as to cause: Secondary | ICD-10-CM

## 2016-04-23 DIAGNOSIS — Z8673 Personal history of transient ischemic attack (TIA), and cerebral infarction without residual deficits: Secondary | ICD-10-CM | POA: Diagnosis not present

## 2016-04-23 DIAGNOSIS — I252 Old myocardial infarction: Secondary | ICD-10-CM | POA: Diagnosis not present

## 2016-04-23 DIAGNOSIS — Z801 Family history of malignant neoplasm of trachea, bronchus and lung: Secondary | ICD-10-CM

## 2016-04-23 DIAGNOSIS — I2510 Atherosclerotic heart disease of native coronary artery without angina pectoris: Secondary | ICD-10-CM | POA: Diagnosis present

## 2016-04-23 DIAGNOSIS — I10 Essential (primary) hypertension: Secondary | ICD-10-CM | POA: Diagnosis present

## 2016-04-23 DIAGNOSIS — Z888 Allergy status to other drugs, medicaments and biological substances status: Secondary | ICD-10-CM

## 2016-04-23 DIAGNOSIS — K567 Ileus, unspecified: Secondary | ICD-10-CM | POA: Diagnosis present

## 2016-04-23 DIAGNOSIS — F0390 Unspecified dementia without behavioral disturbance: Secondary | ICD-10-CM | POA: Diagnosis present

## 2016-04-23 DIAGNOSIS — K5669 Other intestinal obstruction: Secondary | ICD-10-CM | POA: Diagnosis not present

## 2016-04-23 DIAGNOSIS — R45851 Suicidal ideations: Secondary | ICD-10-CM | POA: Diagnosis present

## 2016-04-23 DIAGNOSIS — E1140 Type 2 diabetes mellitus with diabetic neuropathy, unspecified: Secondary | ICD-10-CM | POA: Diagnosis present

## 2016-04-23 DIAGNOSIS — Z79899 Other long term (current) drug therapy: Secondary | ICD-10-CM

## 2016-04-23 DIAGNOSIS — F4323 Adjustment disorder with mixed anxiety and depressed mood: Secondary | ICD-10-CM | POA: Diagnosis present

## 2016-04-23 DIAGNOSIS — Z825 Family history of asthma and other chronic lower respiratory diseases: Secondary | ICD-10-CM | POA: Diagnosis not present

## 2016-04-23 DIAGNOSIS — G8929 Other chronic pain: Secondary | ICD-10-CM | POA: Diagnosis present

## 2016-04-23 DIAGNOSIS — K56609 Unspecified intestinal obstruction, unspecified as to partial versus complete obstruction: Secondary | ICD-10-CM | POA: Insufficient documentation

## 2016-04-23 DIAGNOSIS — Z9049 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: Secondary | ICD-10-CM

## 2016-04-23 DIAGNOSIS — E785 Hyperlipidemia, unspecified: Secondary | ICD-10-CM | POA: Diagnosis present

## 2016-04-23 DIAGNOSIS — Z9884 Bariatric surgery status: Secondary | ICD-10-CM

## 2016-04-23 DIAGNOSIS — Z882 Allergy status to sulfonamides status: Secondary | ICD-10-CM

## 2016-04-23 DIAGNOSIS — Z803 Family history of malignant neoplasm of breast: Secondary | ICD-10-CM

## 2016-04-23 DIAGNOSIS — R1084 Generalized abdominal pain: Secondary | ICD-10-CM

## 2016-04-23 HISTORY — DX: Partial intestinal obstruction, unspecified as to cause: K56.600

## 2016-04-23 LAB — GLUCOSE, CAPILLARY
Glucose-Capillary: 74 mg/dL (ref 65–99)
Glucose-Capillary: 78 mg/dL (ref 65–99)

## 2016-04-23 LAB — COMPREHENSIVE METABOLIC PANEL
ALT: 10 U/L — ABNORMAL LOW (ref 14–54)
AST: 19 U/L (ref 15–41)
Albumin: 4.2 g/dL (ref 3.5–5.0)
Alkaline Phosphatase: 103 U/L (ref 38–126)
Anion gap: 10 (ref 5–15)
BUN: 10 mg/dL (ref 6–20)
CO2: 25 mmol/L (ref 22–32)
Calcium: 10.1 mg/dL (ref 8.9–10.3)
Chloride: 109 mmol/L (ref 101–111)
Creatinine, Ser: 1.07 mg/dL — ABNORMAL HIGH (ref 0.44–1.00)
GFR calc Af Amer: >60 mL/min (ref >60)
GFR calc non Af Amer: 54 mL/min — ABNORMAL LOW (ref >60)
Glucose, Bld: 104 mg/dL — ABNORMAL HIGH (ref 65–99)
Potassium: 3.6 mmol/L (ref 3.5–5.1)
Sodium: 144 mmol/L (ref 135–145)
Total Bilirubin: 0.6 mg/dL (ref 0.3–1.2)
Total Protein: 7.9 g/dL (ref 6.5–8.1)

## 2016-04-23 LAB — CBC WITH DIFFERENTIAL/PLATELET
Basophils Absolute: 0 10*3/uL (ref 0–0.1)
Basophils Relative: 1 %
Eosinophils Absolute: 0 10*3/uL (ref 0–0.7)
Eosinophils Relative: 0 %
HCT: 35.5 % (ref 35.0–47.0)
Hemoglobin: 11.4 g/dL — ABNORMAL LOW (ref 12.0–16.0)
Lymphocytes Relative: 25 %
Lymphs Abs: 2.1 10*3/uL (ref 1.0–3.6)
MCH: 25.9 pg — ABNORMAL LOW (ref 26.0–34.0)
MCHC: 32.2 g/dL (ref 32.0–36.0)
MCV: 80.3 fL (ref 80.0–100.0)
Monocytes Absolute: 0.5 10*3/uL (ref 0.2–0.9)
Monocytes Relative: 6 %
Neutro Abs: 5.8 10*3/uL (ref 1.4–6.5)
Neutrophils Relative %: 68 %
Platelets: 389 10*3/uL (ref 150–440)
RBC: 4.42 MIL/uL (ref 3.80–5.20)
RDW: 15.8 % — ABNORMAL HIGH (ref 11.5–14.5)
WBC: 8.6 10*3/uL (ref 3.6–11.0)

## 2016-04-23 LAB — LIPASE, BLOOD: Lipase: 26 U/L (ref 11–51)

## 2016-04-23 MED ORDER — MORPHINE SULFATE (PF) 4 MG/ML IV SOLN
4.0000 mg | Freq: Once | INTRAVENOUS | Status: AC
  Administered 2016-04-23: 4 mg via INTRAVENOUS

## 2016-04-23 MED ORDER — ONDANSETRON HCL 4 MG/2ML IJ SOLN
INTRAMUSCULAR | Status: AC
  Filled 2016-04-23: qty 2

## 2016-04-23 MED ORDER — GABAPENTIN 100 MG PO CAPS
100.0000 mg | ORAL_CAPSULE | Freq: Three times a day (TID) | ORAL | Status: DC
  Administered 2016-04-23 – 2016-04-25 (×5): 100 mg via ORAL
  Filled 2016-04-23 (×5): qty 1

## 2016-04-23 MED ORDER — ENOXAPARIN SODIUM 40 MG/0.4ML ~~LOC~~ SOLN: 40.0000 mg | SUBCUTANEOUS | Status: DC

## 2016-04-23 MED ORDER — ACETAMINOPHEN 650 MG RE SUPP: 650.0000 mg | Freq: Four times a day (QID) | RECTAL | Status: DC | PRN

## 2016-04-23 MED ORDER — LINACLOTIDE 145 MCG PO CAPS
145.0000 ug | ORAL_CAPSULE | Freq: Every day | ORAL | Status: DC
  Administered 2016-04-24: 145 ug via ORAL
  Filled 2016-04-23 (×2): qty 1

## 2016-04-23 MED ORDER — OXYCODONE HCL 5 MG PO TABS
5.0000 mg | ORAL_TABLET | ORAL | Status: DC | PRN
  Administered 2016-04-24: 5 mg via ORAL
  Filled 2016-04-23: qty 1

## 2016-04-23 MED ORDER — DONEPEZIL HCL 5 MG PO TABS
5.0000 mg | ORAL_TABLET | Freq: Every day | ORAL | Status: DC
  Administered 2016-04-23 – 2016-04-24 (×2): 5 mg via ORAL
  Filled 2016-04-23 (×2): qty 1

## 2016-04-23 MED ORDER — BARIUM SULFATE 2.1 % PO SUSP: 450.0000 mL | ORAL | Status: AC

## 2016-04-23 MED ORDER — ACETAMINOPHEN 325 MG PO TABS: 650.0000 mg | ORAL_TABLET | Freq: Four times a day (QID) | ORAL | Status: DC | PRN

## 2016-04-23 MED ORDER — ONDANSETRON HCL 4 MG/2ML IJ SOLN
4.0000 mg | Freq: Once | INTRAMUSCULAR | Status: AC
  Administered 2016-04-23: 4 mg via INTRAVENOUS

## 2016-04-23 MED ORDER — ONDANSETRON HCL 4 MG/2ML IJ SOLN
INTRAMUSCULAR | Status: AC
  Administered 2016-04-23: 4 mg via INTRAVENOUS
  Filled 2016-04-23: qty 2

## 2016-04-23 MED ORDER — ONDANSETRON HCL 4 MG/2ML IJ SOLN: 4.0000 mg | Freq: Four times a day (QID) | INTRAMUSCULAR | Status: DC | PRN

## 2016-04-23 MED ORDER — INSULIN ASPART 100 UNIT/ML ~~LOC~~ SOLN: 0.0000 [IU] | Freq: Every day | SUBCUTANEOUS | Status: DC

## 2016-04-23 MED ORDER — DIAZEPAM 5 MG PO TABS: 5.0000 mg | ORAL_TABLET | Freq: Four times a day (QID) | ORAL | Status: DC | PRN

## 2016-04-23 MED ORDER — MORPHINE SULFATE (PF) 4 MG/ML IV SOLN
INTRAVENOUS | Status: AC
  Administered 2016-04-23: 4 mg via INTRAVENOUS
  Filled 2016-04-23: qty 1

## 2016-04-23 MED ORDER — BISACODYL 10 MG RE SUPP
10.0000 mg | Freq: Every day | RECTAL | Status: DC | PRN
  Filled 2016-04-23: qty 1

## 2016-04-23 MED ORDER — ONDANSETRON HCL 4 MG PO TABS: 4.0000 mg | ORAL_TABLET | Freq: Four times a day (QID) | ORAL | Status: DC | PRN

## 2016-04-23 MED ORDER — MORPHINE SULFATE (PF) 2 MG/ML IV SOLN
2.0000 mg | INTRAVENOUS | Status: DC | PRN
  Administered 2016-04-24 – 2016-04-25 (×5): 2 mg via INTRAVENOUS
  Filled 2016-04-23 (×5): qty 1

## 2016-04-23 MED ORDER — SODIUM CHLORIDE 0.9 % IV SOLN
INTRAVENOUS | Status: DC
  Administered 2016-04-23: via INTRAVENOUS

## 2016-04-23 MED ORDER — AMLODIPINE BESYLATE 10 MG PO TABS
10.0000 mg | ORAL_TABLET | Freq: Every day | ORAL | Status: DC
  Administered 2016-04-23 – 2016-04-25 (×3): 10 mg via ORAL
  Filled 2016-04-23 (×3): qty 1

## 2016-04-23 MED ORDER — INSULIN ASPART 100 UNIT/ML ~~LOC~~ SOLN
0.0000 [IU] | Freq: Three times a day (TID) | SUBCUTANEOUS | Status: DC
  Filled 2016-04-23: qty 5

## 2016-04-23 MED ORDER — PRENATAL MULTIVITAMIN CH
1.0000 | ORAL_TABLET | Freq: Every day | ORAL | Status: DC
  Administered 2016-04-23 – 2016-04-25 (×3): 1 via ORAL
  Filled 2016-04-23 (×6): qty 1

## 2016-04-23 NOTE — Consult Note (Signed)
[facility] Face-to-Face Psychiatry Consult   Reason for Consult:  Consult for this 65 year old woman who presented to the emergency room with acute abdominal pain. Concern about agitation and erratic behavior Referring Physician:  Lord Patient Identification: Julie Acevedo MRN:  [id number] Principal Diagnosis: Adjustment disorder Diagnosis:   Patient Active Problem List   Diagnosis Date Noted  . Partial small bowel obstruction ([place]) [K56.69] 04/23/2016  . Adjustment disorder with mixed anxiety and depressed mood [F43.23] 04/23/2016  . Memory difficulties [R41.3] 09/01/2015  . Arthritis [M19.90] 06/11/2015  . Back pain, chronic [M54.9, G89.29] 06/11/2015  . HLD (hyperlipidemia) [E78.5] 06/11/2015  . Cannot sleep [G47.00] 06/11/2015  . L-S radiculopathy [M54.17] 06/11/2015  . Arthritis of knee, degenerative [M17.9] 06/11/2015  . B12 deficiency [E53.8] 06/11/2015  . Gastric bypass status for obesity [Z98.84] 05/06/2013  . 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 DM2 (diabetes mellitus, type 2) ([place]) [E11.9] 11/15/2012  . DM (diabetes mellitus) ([place]) [E11.9] 01/27/2012  . HTN (hypertension) [I10] 01/27/2012  . Adiposity [E66.9] 11/21/2009  . Avitaminosis D [E55.9] 11/07/2009  . Narrowing of intervertebral disc space [M99.79] 07/25/2009  . Diabetes ([place]) [E11.9] 07/25/2009  . Benign essential HTN [I10] 07/25/2009    Total Time spent with patient: 1 hour  Subjective:   Julie Acevedo is a 65 y.o. female patient admitted with "you don't know the pain that I've been in".  HPI:  Patient interviewed. Case reviewed with emergency room doctors. Labs and vitals reviewed. 65 year old woman presented to the emergency room with complaints of abdominal pain. Before the workup good relief and get going she was reportedly saying to the ER doctor that no help was being provided to her and saying that she was going to get up and leave the hospital. ER doctor became alarmed at what was  thought to be irrational behavior. Eventually the patient was given pain medicine so she had calm down by the time I saw her. Patient complains of chronic recurrent abdominal pain. It's been going on and on and off for more than months now. She is upset because she's had it worked up in the past even after getting her hernia repaired and has not been satisfied with what she's been told. Reportedly she went back to see a surgeon at one point and was then deferred to a gastroenterologist to do it did an endoscopy and told her that everything was fine. Because the pain did not and the patient is been more and more agitated since then. She says that she can't sleep many nights. Appetite is poor. At times can't get any food down. She says her mood gets very bad and angry and irritable about the pain. She denies however being consistently depressed. Denies being hopeless. Denies any suicidal thoughts or psychotic thoughts. She is not currently taking any kind of pain medicine at home. She does go to a pain clinic it sounds like and get shots for her chronic back pain.  Social history: Patient lives with her husband. Not able to work outside the home. Does have a fair bit of stress in her life.  Medical history: Diabetes high blood pressure status post a past history of gastric bypass for obesity. History of chronic back pain. History of repair 1 time of a ventral hernia.  Substance abuse history: Denies any abuse of alcohol or drugs  Past Psychiatric History: Patient says she seen a counselor in the past. Has not been on antidepressive medication. No history of suicide attempts no  history of psychiatric hospitalization no history of psychosis.  Risk to Self: Is patient at risk for suicide?: No Risk to Others:   Prior Inpatient Therapy:   Prior Outpatient Therapy:    Past Medical History:  Past Medical History  Diagnosis Date  . DM (diabetes mellitus) ([place])   . HTN (hypertension)   . Diverticulitis   .  Arthritis   . Coronary artery disease   . Stroke ([place])   . Short-term memory loss   . Myocardial infarction ([place])   . Memory difficulties 09/01/2015    Past Surgical History  Procedure Laterality Date  . Hemicolectomy    . Lumbar disc surgery    . Roux-en-y gastric bypass    . Cholecystectomy    . Eye surgery    . Hand surgery    . Foot surgery    . Abdominal hysterectomy      due to endometriosis-1 ovary left  . Tonsillectomy    . Upper gi endoscopy  01/12/16    normal larynx, normal esophagus. gastric bypass with a normal-sized pouch and intact staple line, normal examined jejunum, otherwise exam was normal   Family History:  Family History  Problem Relation Age of Onset  . Cancer Father 51    Lung  . Coronary artery disease Father 59  . COPD Mother   . Diabetes Mother   . Hypertension Mother   . Cancer Paternal Aunt     Breast  . Cancer Paternal Grandmother     Breast  . Congestive Heart Failure Maternal Grandmother   . Emphysema Maternal Grandfather   . Heart attack Paternal Grandfather    Family Psychiatric  History: Patient denies any family history of mental health or substance abuse problems Social History:  History  Alcohol Use No     History  Drug Use No    Social History   Social History  . Marital Status: Married    Spouse Name: N/A  . Number of Children: 3  . 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 Smoking status: Never Smoker   . Smokeless tobacco: Never Used  . Alcohol Use: No  . Drug Use: No  . Sexual Activity: Not Asked   Other Topics Concern  . None   Social History Narrative   Lives with husband, daughter and granddaughter.;   Patient drinks about 3 cups of caffeine daily.   Patient is left handed.    Additional Social History:    Allergies:   Allergies  Allergen Reactions  . Lotrel [Amlodipine Besy-Benazepril Hcl] Anaphylaxis  . Contrast Media [Iodinated Diagnostic  Agents] Swelling and Other (See Comments)    Reaction:  Neck swelling   . Niacin Hives  . Sulfa Antibiotics Hives    Labs:  Results for orders placed or performed during the hospital encounter of 04/23/16 (from the past 48 hour(s))  Comprehensive metabolic panel     Status: Abnormal   Collection Time: 04/23/16 12:40 PM  Result Value Ref Range   Sodium 144 135 - 145 mmol/L   Potassium 3.6 3.5 - 5.1 mmol/L   Chloride 109 101 - 111 mmol/L   CO2 25 22 - 32 mmol/L   Glucose, Bld 104 (H) 65 - 99 mg/dL   BUN 10 6 - 20 mg/dL   Creatinine, Ser 1.07 (H) 0.44 - 1.00 mg/dL   Calcium 10.1 8.9 - 10.3 mg/dL   Total Protein 7.9 6.5 -  8.1 g/dL   Albumin 4.2 3.5 - 5.0 g/dL   AST 19 15 - 41 U/L   ALT 10 (L) 14 - 54 U/L   Alkaline Phosphatase 103 38 - 126 U/L   Total Bilirubin 0.6 0.3 - 1.2 mg/dL   GFR calc non Af Amer 54 (L) >60 mL/min   GFR calc Af Amer >60 >60 mL/min    Comment: (NOTE) The eGFR has been calculated using the CKD EPI equation. This calculation has not been validated in all clinical situations. eGFR's persistently <60 mL/min signify possible Chronic Kidney Disease.    Anion gap 10 5 - 15  CBC with Differential     Status: Abnormal   Collection Time: 04/23/16 12:40 PM  Result Value Ref Range   WBC 8.6 3.6 - 11.0 K/uL   RBC 4.42 3.80 - 5.20 MIL/uL   Hemoglobin 11.4 (L) 12.0 - 16.0 g/dL   HCT 83.3 74.4 - 51.4 %   MCV 80.3 80.0 - 100.0 fL   MCH 25.9 (L) 26.0 - 34.0 pg   MCHC 32.2 32.0 - 36.0 g/dL   RDW 60.4 (H) 79.9 - 87.2 %   Platelets 389 150 - 440 K/uL   Neutrophils Relative % 68% %   Neutro Abs 5.8 1.4 - 6.5 K/uL   Lymphocytes Relative 25% %   Lymphs Abs 2.1 1.0 - 3.6 K/uL   Monocytes Relative 6% %   Monocytes Absolute 0.5 0.2 - 0.9 K/uL   Eosinophils Relative 0% %   Eosinophils Absolute 0.0 0 - 0.7 K/uL   Basophils Relative 1% %   Basophils Absolute 0.0 0 - 0.1 K/uL  Lipase, blood     Status: None   Collection Time: 04/23/16 12:40 PM  Result Value Ref Range    Lipase 26 11 - 51 U/L    Current Facility-Administered Medications  Medication Dose Route Frequency Provider Last Rate Last Dose  . 0.9 %  sodium chloride infusion   Intravenous Continuous Wyatt Haste, MD      . acetaminophen (TYLENOL) tablet 650 mg  650 mg Oral Q6H PRN Wyatt Haste, MD       Or  . acetaminophen (TYLENOL) suppository 650 mg  650 mg Rectal Q6H PRN Wyatt Haste, MD      . amLODipine (NORVASC) tablet 10 mg  10 mg Oral Daily Wyatt Haste, MD      . bisacodyl (DULCOLAX) suppository 10 mg  10 mg Rectal Daily PRN Wyatt Haste, MD      . diazepam (VALIUM) tablet 5 mg  5 mg Oral Q6H PRN Wyatt Haste, MD      . donepezil (ARICEPT) tablet 5 mg  5 mg Oral QHS Wyatt Haste, MD      . enoxaparin (LOVENOX) injection 40 mg  40 mg Subcutaneous Q24H Wyatt Haste, MD      . gabapentin (NEURONTIN) capsule 100 mg  100 mg Oral TID Wyatt Haste, MD      . insulin aspart (novoLOG) injection 0-5 Units  0-5 Units Subcutaneous QHS Wyatt Haste, MD      . Melene Muller ON 04/24/2016] insulin aspart (novoLOG) injection 0-9 Units  0-9 Units Subcutaneous TID WC Wyatt Haste, MD      . Melene Muller ON 04/24/2016] linaclotide (LINZESS) capsule 145 mcg  145 mcg Oral QAC breakfast Wyatt Haste, MD      . morphine 2 MG/ML injection 2 mg  2 mg Intravenous Q4H PRN Cletis [place]  Hower, MD      . ondansetron (ZOFRAN) tablet 4 mg  4 mg Oral Q6H PRN Lytle Butte, MD       Or  . ondansetron [facility]) injection 4 mg  4 mg Intravenous Q6H PRN Lytle Butte, MD      . oxyCODONE (Oxy IR/ROXICODONE) immediate release tablet 5 mg  5 mg Oral Q4H PRN Lytle Butte, MD      . prenatal multivitamin tablet 1 tablet  1 tablet Oral Daily Lytle Butte, MD        Musculoskeletal: Strength & Muscle Tone: decreased Gait & Station: unable to stand Patient leans: N/A  Psychiatric Specialty Exam: Review of Systems  Constitutional: Negative.   HENT: Negative.   Eyes: Negative.   Respiratory: Negative.   Cardiovascular: Negative.    Gastrointestinal: Positive for nausea and abdominal pain.  Musculoskeletal: Negative.   Skin: Negative.   Neurological: Negative.   Psychiatric/Behavioral: Negative for depression, suicidal ideas, hallucinations, memory loss and substance abuse. The patient is nervous/anxious and has insomnia.     Blood pressure 141/67, pulse 58, resp. rate 18, height _0  (1.6 m), weight 67.132 kg (148 lb), SpO2 99 %.Body mass index is 26.22 kg/(m^2).  General Appearance: Fairly Groomed  Engineer, water::  Fair  Speech:  Normal Rate  Volume:  Normal  Mood:  Anxious  Affect:  Constricted  Thought Process:  Goal Directed  Orientation:  Full (Time, Place, and Person)  Thought Content:  Negative  Suicidal Thoughts:  No  Homicidal Thoughts:  No  Memory:  Immediate;   Good Recent;   Fair Remote;   Fair  Judgement:  Fair  Insight:  Fair  Psychomotor Activity:  Normal  Concentration:  Fair  Recall:  [employer] of Knowledge:Fair  Language: Fair  Akathisia:  No  Handed:  Right  AIMS (if indicated):     Assets:  Communication Skills Desire for Improvement Housing Resilience  ADL's:  Intact  Cognition: WNL  Sleep:      Treatment Plan Summary: Plan 65 year old woman with both chronic and acute pain. It sounds like she has been putting up with this pain for quite a long time and has developed a lot of frustration with medical treatment. She admits that she was in a great deal of pain when she came in earlier and that was why she was irritable to the physicians. Now that she has been given some pain medicine she feels much more relaxed and calm. Patient completely denies any suicidal thoughts. Any statements that she made earlier were made in anger and without any actual intention of harming herself. Does not appear to have a major mood disorder. Diagnosis would be adjustment disorder with mixed disturbance of mood and behavior. No indication for any psychiatric medicine. Case reviewed with emergency room  team. No need for psychiatric follow-up at this point and less new issues arise. IVC discontinued.  Disposition: Patient does not meet criteria for psychiatric inpatient admission. Supportive therapy provided about ongoing stressors.  Alethia Berthold, MD 04/23/2016 8:07 PM

## 2016-04-23 NOTE — ED Provider Notes (Signed)
CT reviewed, reveals partial small bowel obstruction. Case discussed with Dr. Weber Cooks after his evaluation as well as he feels the patient psychiatrically stable and does not want any further psychiatric workup or evaluation or treatment at this time. Reevaluated the patient at 5:45 PM, still has generalized abdominal tenderness with mild distention. Vital signs are normal and stable, but her pain is worsening again. I discussed with the hospitalists for admission over the partial small bowel obstruction.  Carrie Mew, MD 04/23/16 [phone]

## 2016-04-23 NOTE — ED Provider Notes (Signed)
[facility] Emergency Department Provider Note   ____________________________________________  Time seen: I have reviewed the triage vital signs and the triage nursing note.  HISTORY  Chief Complaint Abdominal Pain   Historian Patient, difficult historian/very poor historian  HPI Julie Acevedo is a 65 y.o. female who states she is here for abdominal pain. Initially she told me that she's had this kind of pain for months and nobody has told her what is going on, and that she has been worked up by [employer]. Then she additionally tells me that this pain seems to be diffuse and moving around but more so in the mid abdomen and may be worse since about 1 week.  Denies fevers. Reports nausea without vomiting. Denies black or bloody stools.  Denies chest pain or trouble breathing.  Over over states "you are doing nothing for me here."    Past Medical History  Diagnosis Date  . DM (diabetes mellitus) ([place])   . HTN (hypertension)   . Diverticulitis   . Arthritis   . Coronary artery disease   . Stroke ([place])   . Short-term memory loss   . Myocardial infarction ([place])   . Memory difficulties 09/01/2015    Patient Active Problem List   Diagnosis Date Noted  . Memory difficulties 09/01/2015  . Arthritis 06/11/2015  . Back pain, chronic 06/11/2015  . HLD (hyperlipidemia) 06/11/2015  . Decreased potassium in the blood 06/11/2015  . Cannot sleep 06/11/2015  . L-S radiculopathy 06/11/2015  . Arthritis of knee, degenerative 06/11/2015  . B12 deficiency 06/11/2015  . 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 Acute encephalopathy 11/15/2012  . Dysphasia 11/15/2012  . DM2 (diabetes mellitus, type 2) ([place]) 11/15/2012  . DM (diabetes mellitus) ([place]) 01/27/2012  . Chest pain, localized 01/27/2012  . HTN (hypertension) 01/27/2012  . Adiposity 11/21/2009  . Avitaminosis D 11/07/2009  . Narrowing  of intervertebral disc space 07/25/2009  . Diabetes ([place]) 07/25/2009  . Benign essential HTN 07/25/2009    Past Surgical History  Procedure Laterality Date  . Hemicolectomy    . Lumbar disc surgery    . Roux-en-y gastric bypass    . Cholecystectomy    . Eye surgery    . Hand surgery    . Foot surgery    . Abdominal hysterectomy      due to endometriosis-1 ovary left  . Tonsillectomy    . Upper gi endoscopy  01/12/16    normal larynx, normal esophagus. gastric bypass with a normal-sized pouch and intact staple line, normal examined jejunum, otherwise exam was normal    Current Outpatient Rx  Name  Route  Sig  Dispense  Refill  . amLODipine (NORVASC) 10 MG tablet   Oral   Take 1 tablet (10 mg total) by mouth daily.   90 tablet   2   . diazepam (VALIUM) 5 MG tablet      TAKE 1 TABLET BY MOUTH EVERY 6 HOURS AS NEEDED FOR ANXIETY CAN TAKE 1/2-1 TABLET   100 tablet   2     This request is for a new prescription for a contr ...   . donepezil (ARICEPT) 5 MG tablet      TAKE 1 TABLET (5 MG TOTAL) BY MOUTH AT BEDTIME.   30 tablet   0   . gabapentin (NEURONTIN) 100 MG capsule   Oral   Take 1 capsule (100 mg total) by mouth 3 (  three) times daily.   90 capsule   3   . hydrochlorothiazide (HYDRODIURIL) 25 MG tablet   Oral   Take 1 tablet (25 mg total) by mouth daily.   90 tablet   2   . HYDROcodone-acetaminophen (NORCO) 10-325 MG tablet   Oral   Take 1 tablet by mouth every 4 (four) hours as needed for moderate pain.   150 tablet   0   . Linaclotide (LINZESS) 145 MCG CAPS capsule   Oral   Take 1 capsule (145 mcg total) by mouth daily. Given as samples   30 capsule   12   . metFORMIN (GLUCOPHAGE) 500 MG tablet   Oral   Take 1 tablet (500 mg total) by mouth 2 (two) times daily with a meal.   180 tablet   2   . oxyCODONE (OXY IR/ROXICODONE) 5 MG immediate release tablet   Oral   Take 1 tablet (5 mg total) by mouth every 4 (four) hours as needed for severe  pain.   100 tablet   0   . predniSONE (STERAPRED UNI-PAK 48 TAB) 10 MG (48) TBPK tablet   Oral   Take by mouth daily. As directed   48 tablet   0   . Prenatal Vit-Fe Fumarate-FA (PRENATAL MULTIVITAMIN) TABS tablet   Oral   Take 1 tablet by mouth daily.         . promethazine (PHENERGAN) 12.5 MG tablet   Oral   Take 1 tablet (12.5 mg total) by mouth every 8 (eight) hours as needed for nausea or vomiting.   20 tablet   0     Allergies Lotrel; Contrast media; Niacin; and Sulfa antibiotics  Family History  Problem Relation Age of Onset  . Cancer Father 47    Lung  . Coronary artery disease Father 86  . COPD Mother   . Diabetes Mother   . Hypertension Mother   . Cancer Paternal Aunt     Breast  . Cancer Paternal Grandmother     Breast  . Congestive Heart Failure Maternal Grandmother   . Emphysema Maternal Grandfather   . Heart attack Paternal Grandfather     Social History Social History  Substance Use Topics  . Smoking status: Never Smoker   . Smokeless tobacco: Never Used  . Alcohol Use: No    Review of Systems  Constitutional: Negative for fever. Eyes: Negative for visual changes. ENT: Negative for sore throat. Cardiovascular: Negative for chest pain. Respiratory: Negative for shortness of breath. Gastrointestinal: As per history of present illness Genitourinary: Negative for dysuria. Musculoskeletal: Negative for back pain. Skin: Negative for rash. Neurological: Negative for headache. 10 point Review of Systems otherwise negative ____________________________________________   PHYSICAL EXAM:  VITAL SIGNS: ED Triage Vitals  Enc Vitals Group     BP 04/23/16 1155 124/76 mmHg     Pulse Rate 04/23/16 1155 105     Resp 04/23/16 1155 22     Temp --      Temp Source 04/23/16 1155 Oral     SpO2 04/23/16 1155 100 %     Weight 04/23/16 1155 148 lb (67.132 kg)     Height 04/23/16 1155 5\' 3"  (1.6 m)     Head Cir --      Peak Flow --      Pain Score  04/23/16 1244 10     Pain Loc --      Pain Edu? --      Excl. in [place]? --  Constitutional: Alert But very difficult historian. Initially sleeping and in no acute distress, but upon talking to her she started moaning and holding her abdomen in severe pain and was trying to get up and walk around but was bent over at the waist complaining of abdominal pain. HEENT   Head: Normocephalic and atraumatic.      Eyes: Conjunctivae are normal. PERRL. Normal extraocular movements.      Ears:         Nose: No congestion/rhinnorhea.   Mouth/Throat: Mucous membranes are moist.   Neck: No stridor. Cardiovascular/Chest: Normal rate, regular rhythm.  No murmurs, rubs, or gallops. Respiratory: Normal respiratory effort without tachypnea nor retractions. Breath sounds are clear and equal bilaterally. No wheezes/rales/rhonchi. Gastrointestinal: Soft. No distention, but moderate to severe tenderness diffusely. Unable to appreciate guarding or rebound.  Genitourinary/rectal:Deferred Musculoskeletal: Nontender with normal range of motion in all extremities. No joint effusions.  No lower extremity tenderness.  No edema. Neurologic:  Normal speech and language. No gross or focal neurologic deficits are appreciated. Skin:  Skin is warm, dry and intact. No rash noted. Psychiatric: Patient unable to focus and provides multiple different versions of the history, but still constant/consistent with abdominal pain as the primary reason for visiting here. She is emotionally labile and seems to be nearly tearful and then yells at me, and gets up on her own trying to leave the emergency department. No witnessed auditory or visual hallucinations. Upon asking is she is suicidal, patient answers "if I had a God damn gun I would blow my brains out."  ____________________________________________   EKG I, Lisa Roca, MD, the attending physician have personally viewed and interpreted all ECGs.  86 bpm. Narrow QRS.  Normal axis. Normal ST and T-wave ____________________________________________  LABS (pertinent positives/negatives)  Conference metabolic panel significant for creatinine 1.07 otherwise without significant abnormalities White blood count 8.6 without left shift. Hemoglobin 11.4 platelet count 389 Lipase 26 Urinalysis pending  ____________________________________________  RADIOLOGY All Xrays were viewed by me. Imaging interpreted by Radiologist.  CT abdomen without contrast: Pending __________________________________________  PROCEDURES  Procedure(s) performed: None  Critical Care performed: CRITICAL CARE Performed by: Lisa Roca   Total critical care time: 45 minutes  Critical care time was exclusive of separately billable procedures and treating other patients.  Critical care was necessary to treat or prevent imminent or life-threatening deterioration.  Critical care was time spent personally by me on the following activities: development of treatment plan with patient and/or surrogate as well as nursing, discussions with consultants, evaluation of patient's response to treatment, examination of patient, obtaining history from patient or surrogate, ordering and performing treatments and interventions, ordering and review of laboratory studies, ordering and review of radiographic studies, pulse oximetry and re-evaluation of patient's condition.   ____________________________________________   ED COURSE / ASSESSMENT AND PLAN  Pertinent labs & imaging results that were available during my care of the patient were reviewed by me and considered in my medical decision making (see chart for details).   When he first went in to evaluate the patient and discuss her laboratory studies, she was calm, however as soon as she got partially through the history and I was having difficulty figuring out if she is having acute or subacute/chronic problem, patient became significantly  irate and started repeating that we were doing nothing for her. She stated that she wanted to leave the hospital. When she stood up she was bent over holding on to her abdomen.  I was able to  that point evaluated the available notes in Epic and care everywhere, and it does not appear to me that she has a history of frequent or repeated evaluations for abdominal pain. She does however have a history of gastric bypass, and this does raise concern for her risk of intra-abdominal emergencies.  I do think based on her symptoms she does need a CT scan as well as urinalysis.  I went back to discuss this again with the patient and she still stated that she wanted to leave, and when I directly asked her if she was suicidal because her state of mind did seem to be abnormal, she stated that if she had a gun she would blow her brains out. At this point time I did go ahead and place her under involuntary commitment due to concern for self-harm, and her decision-making process seems abnormal.  Critical care time spent in face-to-face with patient with discussion a redirection for evaluation and mental state of mind.  Patient was able to be redirected into a new room and have an IV placed with pain medications and to get a CT scan of the abdomen. This had to be done without contrast given the reported allergy to dye.  Patient care transferred to Dr. Joni Fears at shift change 4 PM. CT scan abdomen as well as urinalysis are pending. Consult TTS and psychiatry are pending.   CONSULTATIONS:   Psychiatry, Dr. Weber Cooks will see.   Patient / Family / Caregiver informed of clinical course, medical decision-making process, and agree with plan.   I discussed return precautions, follow-up instructions, and discharged instructions with patient and/or family.   ___________________________________________   FINAL CLINICAL IMPRESSION(S) / ED DIAGNOSES   Final diagnoses:  Generalized abdominal pain  Suicidal ideation               Note: This dictation was prepared with Dragon dictation. Any transcriptional errors that result from this process are unintentional   Lisa Roca, MD 04/23/16 [id number]

## 2016-04-23 NOTE — H&P (Signed)
[facility] [place] NAME: Julie Acevedo    MR#:  [id number]  DATE OF BIRTH:  [birth year]   DATE OF ADMISSION:  04/23/2016  PRIMARY CARE PHYSICIAN: [name], MD   REQUESTING/REFERRING PHYSICIAN: stafford  CHIEF COMPLAINT:   Chief Complaint  Patient presents with  . Abdominal Pain    HISTORY OF PRESENT ILLNESS:  Julie Acevedo  is a 65 y.o. female with a known history of prior gastric bypass surgery who is presenting with abdominal pain. She states intermittent symptoms for months but gradually worsening. Now for the last 3 days intolerant of oral intake - causing nausea, abdominal pain "fullness" 9/10 non radiating no relieving factors. Positive constipation - unsure of last "normal" bowel movement had small amount of loose stool the day of admission, still passing flatus.  PAST MEDICAL HISTORY:   Past Medical History  Diagnosis Date  . DM (diabetes mellitus) ([place])   . HTN (hypertension)   . Diverticulitis   . Arthritis   . Coronary artery disease   . Stroke ([place])   . Short-term memory loss   . Myocardial infarction ([place])   . Memory difficulties 09/01/2015    PAST SURGICAL HISTORY:   Past Surgical History  Procedure Laterality Date  . Hemicolectomy    . Lumbar disc surgery    . Roux-en-y gastric bypass    . Cholecystectomy    . Eye surgery    . Hand surgery    . Foot surgery    . Abdominal hysterectomy      due to endometriosis-1 ovary left  . Tonsillectomy    . Upper gi endoscopy  01/12/16    normal larynx, normal esophagus. gastric bypass with a normal-sized pouch and intact staple line, normal examined jejunum, otherwise exam was normal    SOCIAL HISTORY:   Social History  Substance Use Topics  . Smoking status: Never Smoker   . Smokeless tobacco: Never Used  . Alcohol Use: No    FAMILY HISTORY:   Family History  Problem Relation Age of Onset  . Cancer Father 12    Lung  . Coronary artery disease  Father 62  . COPD Mother   . Diabetes Mother   . Hypertension Mother   . Cancer Paternal Aunt     Breast  . Cancer Paternal Grandmother     Breast  . Congestive Heart Failure Maternal Grandmother   . Emphysema Maternal Grandfather   . Heart attack Paternal Grandfather     DRUG ALLERGIES:   Allergies  Allergen Reactions  . Lotrel [Amlodipine Besy-Benazepril Hcl] Anaphylaxis  . Contrast Media [Iodinated Diagnostic Agents] Swelling and Other (See Comments)    Reaction:  Neck swelling   . Niacin Hives  . Sulfa Antibiotics Hives    REVIEW OF SYSTEMS:  REVIEW OF SYSTEMS:  CONSTITUTIONAL: Denies fevers, chills,Positive fatigue, weakness.  EYES: Denies blurred vision, double vision, or eye pain.  EARS, NOSE, THROAT: Denies tinnitus, ear pain, hearing loss.  RESPIRATORY: denies cough, shortness of breath, wheezing  CARDIOVASCULAR: Denies chest pain, palpitations, edema.  GASTROINTESTINAL: Positive nausea, , , abdominal pain. Denies vomiting GENITOURINARY: Denies dysuria, hematuria.  ENDOCRINE: Denies nocturia or thyroid problems. HEMATOLOGIC AND LYMPHATIC: Denies easy bruising or bleeding.  SKIN: Denies rash or lesions.  MUSCULOSKELETAL: Denies pain in neck, back, shoulder, knees, hips, or further arthritic symptoms.  NEUROLOGIC: Denies paralysis, paresthesias.  PSYCHIATRIC: Denies anxiety or depressive symptoms. Otherwise full review of systems performed by me  is negative.   MEDICATIONS AT HOME:   Prior to Admission medications   Medication Sig Start Date End Date Taking? Authorizing Provider  amLODipine (NORVASC) 10 MG tablet Take 1 tablet (10 mg total) by mouth daily. 11/03/15  Yes Richard Maceo Pro., MD  diazepam (VALIUM) 5 MG tablet Take 2.5-5 mg by mouth every 6 (six) hours as needed for anxiety.   Yes Historical Provider, MD  gabapentin (NEURONTIN) 100 MG capsule Take 1 capsule (100 mg total) by mouth 3 (three) times daily. 02/21/16  Yes Richard Maceo Pro., MD    Melatonin 10 MG TABS Take 10 mg by mouth at bedtime.   Yes Historical Provider, MD  metFORMIN (GLUCOPHAGE) 500 MG tablet Take 1 tablet (500 mg total) by mouth 2 (two) times daily with a meal. 11/03/15  Yes Jerrol Banana., MD  Prenatal Vit-Fe Fumarate-FA (PRENATAL MULTIVITAMIN) TABS tablet Take 1 tablet by mouth daily.   Yes Historical Provider, MD  promethazine (PHENERGAN) 12.5 MG tablet Take 1 tablet (12.5 mg total) by mouth every 8 (eight) hours as needed for nausea or vomiting. 02/23/16  Yes Dennis E Chrismon, PA  HYDROcodone-acetaminophen (NORCO) 10-325 MG tablet Take 1 tablet by mouth every 4 (four) hours as needed for moderate pain. 02/21/16   Richard Maceo Pro., MD  Linaclotide [facility]) 145 MCG CAPS capsule Take 1 capsule (145 mcg total) by mouth daily. Given as samples 08/11/15   Jerrol Banana., MD      VITAL SIGNS:  Blood pressure 127/76, pulse 74, resp. rate 16, SpO2 100 %.  PHYSICAL EXAMINATION:  VITAL SIGNS: Filed Vitals:   04/23/16 1600 04/23/16 1754  BP: 127/77 127/76  Pulse: 62 74  Resp: 1 16   GENERAL:64 y.o.female currently in no acute distress.  HEAD: Normocephalic, atraumatic.  EYES: Pupils equal, round, reactive to light. Extraocular muscles intact. No scleral icterus.  MOUTH: Moist mucosal membrane. Dentition intact. No abscess noted.  EAR, NOSE, THROAT: Clear without exudates. No external lesions.  NECK: Supple. No thyromegaly. No nodules. No JVD.  PULMONARY: Clear to ascultation, without wheeze rails or rhonci. No use of accessory muscles, Good respiratory effort. good air entry bilaterally CHEST: Nontender to palpation.  CARDIOVASCULAR: S1 and S2. Regular rate and rhythm. No murmurs, rubs, or gallops. No edema. Pedal pulses 2+ bilaterally.  GASTROINTESTINAL: Soft, Tenderness to palpation in periumbilical without rebound or guarding, nondistended. No masses. Positive bowel sounds. No hepatosplenomegaly.  MUSCULOSKELETAL: No swelling, clubbing, or  edema. Range of motion full in all extremities.  NEUROLOGIC: Cranial nerves II through XII are intact. No gross focal neurological deficits. Sensation intact. Reflexes intact.  SKIN: No ulceration, lesions, rashes, or cyanosis. Skin warm and dry. Turgor intact.  PSYCHIATRIC: Mood, affect within normal limits. The patient is awake, alert and oriented x 3. Insight, judgment intact.    LABORATORY PANEL:   CBC  Recent Labs Lab 04/23/16 1240  WBC 8.6  HGB 11.4*  HCT 35.5  PLT 389   ------------------------------------------------------------------------------------------------------------------  Chemistries   Recent Labs Lab 04/23/16 1240  NA 144  K 3.6  CL 109  CO2 25  GLUCOSE 104*  BUN 10  CREATININE 1.07*  CALCIUM 10.1  AST 19  ALT 10*  ALKPHOS 103  BILITOT 0.6   ------------------------------------------------------------------------------------------------------------------  Cardiac Enzymes No results for input(s): TROPONINI in the last 168 hours. ------------------------------------------------------------------------------------------------------------------  RADIOLOGY:  Ct Abdomen Pelvis Wo Contrast  04/23/2016  CLINICAL DATA:  Abdominal pain.  History of gastric bypass. EXAM: CT  ABDOMEN AND PELVIS WITHOUT CONTRAST TECHNIQUE: Multidetector CT imaging of the abdomen and pelvis was performed following the standard protocol without IV contrast. COMPARISON:  12/14/2015 FINDINGS: Postcholecystectomy Ventral hernia containing adipose tissue is stable. Postoperative changes from gastric bypass surgery are noted. Spleen, liver, adrenal glands, and left kidney are within normal limits. Mild right hydronephrosis is stable Dilated small bowel loops are present with air-fluid levels. Distal small bowel and colon are decompressed. There is a suspected transition point in the ileum at the mid pelvic brim on image 56. There is no focal mass. L4-5 fusion with pedicle screws is noted.  No breakage or loosening of the hardware. There is no free fluid and there is no evidence of abnormal retroperitoneal adenopathy. IMPRESSION: Partial small bowel obstruction pattern. Transition point occurs in the distal ileum. Postoperative changes as described. Stable right hydronephrosis. Electronically Signed   By: Marybelle Killings M.D.   On: 04/23/2016 16:42    EKG:   Orders placed or performed during the hospital encounter of 04/23/16  . ED EKG  . ED EKG    IMPRESSION AND PLAN:   65 year old African-American female history of gastric bypass surgery, cholecystectomy, hysterectomy presenting with abdominal pain. Found to have partial small bowel obstruction.  1. Partial small bowel obstruction: Consult Gen. surgery Place nothing by mouth for bowel rest if symptoms worsen NG tube decompression and patient does not want at this time. 2. Type 2 diabetes non-insulin-requiring hold oral agents at insulin sliding scale 3. Essential hypertension: Continue home medications 4. Hyperlipidemia unspecified statin therapy 5. Venous thrombi embolism prophylactic: Lovenox    All the records are reviewed and case discussed with ED provider. Management plans discussed with the patient, family and they are in agreement.  CODE STATUS: Full  TOTAL TIME TAKING CARE OF THIS PATIENT: 33 minutes.    Davante Gerke,  Karenann Cai.D on 04/23/2016 at 6:09 PM  Between 7am to 6pm - Pager - [phone]  After 6pm: House Pager: - [phone]  [facility]  Office  [phone]  CC: Primary care physician; [name], MD

## 2016-04-23 NOTE — ED Notes (Signed)
Pt changed into hospital gown; IV restarted. Pt medicated, pt resting quietly in room with even and non labored respirations noted. Will continue to monitor.

## 2016-04-23 NOTE — ED Notes (Signed)
MD at bedside. 

## 2016-04-23 NOTE — Progress Notes (Signed)
Pt orders stated NPO. Prime Dr notified and orders changed to NPO with sips with meds

## 2016-04-23 NOTE — ED Notes (Signed)
Reports mid abd pain x 3 months, worse today

## 2016-04-23 NOTE — Telephone Encounter (Signed)
Patient called saying that she is in severe abdominal pain since last night. She reports that she has a hernia and a mesh was placed in 2010. She reports that the pain has gradually gotten worse over the last 6 months. Patient reports that she is unable to eat, and has not had a BM in 2 days. Patient reports that her nausea is the reason why she is unable to eat. Patient reports that the pain is constant and does not radiate. Patient has not taken anything for the pain because she reports that anything she puts in her mouth makes her sick. Advised patient that she may need to be seen in the ER due to symptoms. However, patient reports that she can wait until this afternoon to see Dr. Rosanna Randy. Advised patient that if symptoms worsen, she needs to go to the ER. Scheduled patient an appt to be seen today. Patient verbalized understanding.

## 2016-04-24 ENCOUNTER — Inpatient Hospital Stay: Payer: Medicare PPO

## 2016-04-24 DIAGNOSIS — K5669 Other intestinal obstruction: Secondary | ICD-10-CM

## 2016-04-24 LAB — GLUCOSE, CAPILLARY
Glucose-Capillary: 74 mg/dL (ref 65–99)
Glucose-Capillary: 104 mg/dL — ABNORMAL HIGH (ref 65–99)
Glucose-Capillary: 68 mg/dL (ref 65–99)
Glucose-Capillary: 150 mg/dL — ABNORMAL HIGH (ref 65–99)
Glucose-Capillary: 60 mg/dL — ABNORMAL LOW (ref 65–99)
Glucose-Capillary: 80 mg/dL (ref 65–99)

## 2016-04-24 LAB — BASIC METABOLIC PANEL
Anion gap: 5 (ref 5–15)
BUN: 7 mg/dL (ref 6–20)
CO2: 29 mmol/L (ref 22–32)
Calcium: 9 mg/dL (ref 8.9–10.3)
Chloride: 110 mmol/L (ref 101–111)
Creatinine, Ser: 0.86 mg/dL (ref 0.44–1.00)
GFR calc Af Amer: >60 mL/min (ref >60)
GFR calc non Af Amer: >60 mL/min (ref >60)
Glucose, Bld: 88 mg/dL (ref 65–99)
Potassium: 4.2 mmol/L (ref 3.5–5.1)
Sodium: 144 mmol/L (ref 135–145)

## 2016-04-24 LAB — CBC
HCT: 30.2 % — ABNORMAL LOW (ref 35.0–47.0)
Hemoglobin: 10 g/dL — ABNORMAL LOW (ref 12.0–16.0)
MCH: 26.5 pg (ref 26.0–34.0)
MCHC: 33 g/dL (ref 32.0–36.0)
MCV: 80.2 fL (ref 80.0–100.0)
Platelets: 307 10*3/uL (ref 150–440)
RBC: 3.77 MIL/uL — ABNORMAL LOW (ref 3.80–5.20)
RDW: 15.5 % — ABNORMAL HIGH (ref 11.5–14.5)
WBC: 7.5 10*3/uL (ref 3.6–11.0)

## 2016-04-24 LAB — URINALYSIS COMPLETE WITH MICROSCOPIC (ARMC ONLY)
Bilirubin Urine: NEGATIVE
Glucose, UA: NEGATIVE mg/dL
Nitrite: POSITIVE — AB
Protein, ur: NEGATIVE mg/dL
Specific Gravity, Urine: 1.017 (ref 1.005–1.030)
pH: 5 (ref 5.0–8.0)

## 2016-04-24 LAB — HEMOGLOBIN A1C: Hgb A1c MFr Bld: 5.9 % (ref 4.0–6.0)

## 2016-04-24 MED ORDER — DEXTROSE 50 % IV SOLN
INTRAVENOUS | Status: AC
  Administered 2016-04-24: 25 mL
  Filled 2016-04-24: qty 50

## 2016-04-24 NOTE — Progress Notes (Signed)
[facility] [place] NAME: Julie Acevedo    MR#:  [id number]  DATE OF BIRTH:  May 23, 1951  SUBJECTIVE:   Patient here due to abdominal pain nausea and vomiting and noted to have partial small bowel obstruction. Still having some abdominal pain but no nausea and vomiting. Abdomen is not distended.  REVIEW OF SYSTEMS:    Review of Systems  Constitutional: Negative for fever and chills.  HENT: Negative for congestion and tinnitus.   Eyes: Negative for blurred vision and double vision.  Respiratory: Negative for cough, shortness of breath and wheezing.   Cardiovascular: Negative for chest pain, orthopnea and PND.  Gastrointestinal: Positive for abdominal pain. Negative for nausea, vomiting and diarrhea.  Genitourinary: Negative for dysuria and hematuria.  Neurological: Negative for dizziness, sensory change and focal weakness.  All other systems reviewed and are negative.   Nutrition: Nothing by mouth Tolerating Diet: No Tolerating PT: Await evaluation    DRUG ALLERGIES:   Allergies  Allergen Reactions  . Lotrel [Amlodipine Besy-Benazepril Hcl] Anaphylaxis  . Contrast Media [Iodinated Diagnostic Agents] Swelling and Other (See Comments)    Reaction:  Neck swelling   . Niacin Hives  . Sulfa Antibiotics Hives    VITALS:  Blood pressure 100/52, pulse 66, temperature 98.3 F (36.8 C), temperature source Oral, resp. rate 16, height 5\' 3"  (1.6 m), weight 66.361 kg (146 lb 4.8 oz), SpO2 94 %.  PHYSICAL EXAMINATION:   Physical Exam  GENERAL:  65 y.o.-year-old patient lying in the bed in no acute distress.  EYES: Pupils equal, round, reactive to light and accommodation. No scleral icterus. Extraocular muscles intact.  HEENT: Head atraumatic, normocephalic. Oropharynx and nasopharynx clear.  NECK:  Supple, no jugular venous distention. No thyroid enlargement, no tenderness.  LUNGS: Normal breath sounds bilaterally, no wheezing, rales,  rhonchi. No use of accessory muscles of respiration.  CARDIOVASCULAR: S1, S2 normal. No murmurs, rubs, or gallops.  ABDOMEN: Soft, Tender diffusely, no rebound, rigidity, nondistended. Bowel sounds present. No organomegaly or mass.  EXTREMITIES: No cyanosis, clubbing or edema b/l.    NEUROLOGIC: Cranial nerves II through XII are intact. No focal Motor or sensory deficits b/l.   PSYCHIATRIC: The patient is alert and oriented x 2.  SKIN: No obvious rash, lesion, or ulcer.    LABORATORY PANEL:   CBC  Recent Labs Lab 04/24/16 0526  WBC 7.5  HGB 10.0*  HCT 30.2*  PLT 307   ------------------------------------------------------------------------------------------------------------------  Chemistries   Recent Labs Lab 04/23/16 1240 04/24/16 0526  NA 144 144  K 3.6 4.2  CL 109 110  CO2 25 29  GLUCOSE 104* 88  BUN 10 7  CREATININE 1.07* 0.86  CALCIUM 10.1 9.0  AST 19  --   ALT 10*  --   ALKPHOS 103  --   BILITOT 0.6  --    ------------------------------------------------------------------------------------------------------------------  Cardiac Enzymes No results for input(s): TROPONINI in the last 168 hours. ------------------------------------------------------------------------------------------------------------------  RADIOLOGY:  Ct Abdomen Pelvis Wo Contrast  04/23/2016  CLINICAL DATA:  Abdominal pain.  History of gastric bypass. EXAM: CT ABDOMEN AND PELVIS WITHOUT CONTRAST TECHNIQUE: Multidetector CT imaging of the abdomen and pelvis was performed following the standard protocol without IV contrast. COMPARISON:  12/14/2015 FINDINGS: Postcholecystectomy Ventral hernia containing adipose tissue is stable. Postoperative changes from gastric bypass surgery are noted. Spleen, liver, adrenal glands, and left kidney are within normal limits. Mild right hydronephrosis is stable Dilated small bowel loops are present with air-fluid levels.  Distal small bowel and colon are  decompressed. There is a suspected transition point in the ileum at the mid pelvic brim on image 56. There is no focal mass. L4-5 fusion with pedicle screws is noted. No breakage or loosening of the hardware. There is no free fluid and there is no evidence of abnormal retroperitoneal adenopathy. IMPRESSION: Partial small bowel obstruction pattern. Transition point occurs in the distal ileum. Postoperative changes as described. Stable right hydronephrosis. Electronically Signed   By: Marybelle Killings M.D.   On: 04/23/2016 16:42   Dg Abd Acute W/chest  04/24/2016  CLINICAL DATA:  Pt admitted yesterday for SBO; pt kept grabbing her lower abdomen due to pain; pt states this has happened to her before; Hysterectomy, gallbladder, lumbar disc surgery; Diabetes, htn, CAD, stroke EXAM: DG ABDOMEN ACUTE W/ 1V CHEST COMPARISON:  CT 04/23/2016 FINDINGS: Heart size and mediastinal contours are within normal limits. Lungs are clear. No effusion. No free air. Small bowel decompressed. Residual oral contrast material throughout the colon which is non distended. Scattered sigmoid diverticula are evident. Bilateral pelvic phleboliths. Surgical clips near the GE junction and in the right upper abdomen. Lumbar fixation hardware L4-5 as before. IMPRESSION: 1. Nonobstructive bowel gas pattern with residual colonic contrast. 2. No acute cardiopulmonary disease. Electronically Signed   By: Lucrezia Europe M.D.   On: 04/24/2016 16:13     ASSESSMENT AND PLAN:   65 year old female with past medical history of dementia, diabetes, hypertension, previous history of diverticulitis, previous CVA who presented to the hospital due to abdominal pain nausea and vomiting and noted to have partial small bowel obstruction.  1. Partial small bowel obstruction-this is a cause of patient's abdominal pain nausea vomiting. -Continue supportive care with IV fluids, antiemetics, pain control.  -No abdominal distention and therefore no need for NG tube  presently. Await surgical input.  2. Diabetes type 2 without complication-patient is currently nothing by mouth. Continue sliding scale insulin.  3. Diabetic neuropathy-continue gabapentin.  4. History of dementia-continue Aricept.  5. Hypertension-continue Norvasc.   All the records are reviewed and case discussed with Care Management/Social Workerr. Management plans discussed with the patient, family and they are in agreement.  CODE STATUS: Full  DVT Prophylaxis: Lovenox  TOTAL TIME TAKING CARE OF THIS PATIENT: 25 minutes.   POSSIBLE D/C IN 2-3 DAYS, DEPENDING ON CLINICAL CONDITION.   Henreitta Leber M.D on 04/24/2016 at 4:17 PM  Between 7am to 6pm - Pager - [phone]  After 6pm go to [email or web address] - password [email or web address] [facility]  Office  [phone]  CC: Primary care physician; [name], MD

## 2016-04-24 NOTE — Consult Note (Signed)
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 Partial small bowel obstruction ([place]) [K56.69] 04/23/2016  . Adjustment disorder with mixed anxiety and depressed mood [F43.23] 04/23/2016  . Memory difficulties [R41.3] 09/01/2015  . Arthritis [M19.90] 06/11/2015  . Back pain, chronic [M54.9, G89.29] 06/11/2015  . HLD (hyperlipidemia) [E78.5] 06/11/2015  . Cannot sleep [G47.00] 06/11/2015  . L-S radiculopathy [M54.17] 06/11/2015  . Arthritis of knee, degenerative [M17.9] 06/11/2015  . B12 deficiency [E53.8] 06/11/2015  . Gastric bypass status for obesity [Z98.84] 05/06/2013  . 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 DM2 (diabetes mellitus, type 2) ([place]) [E11.9] 11/15/2012  . DM (diabetes mellitus) ([place]) [E11.9] 01/27/2012  . HTN (hypertension) [I10] 01/27/2012  . Adiposity [E66.9] 11/21/2009  . Avitaminosis D [E55.9] 11/07/2009  . Narrowing of intervertebral disc space [M99.79] 07/25/2009  . Diabetes ([place]) [E11.9] 07/25/2009  . Benign essential HTN [I10] 07/25/2009    Total Time spent with patient: 20 minutes  Subjective:   Julie Acevedo is a 65 y.o. female patient admitted with "I'm feeling better today".  HPI:  Follow-up for this 65 year old woman I saw yesterday in the emergency room. Consult yesterday was concerning her behavior when she first presented to the emergency room. She was agitated and was talking about leaving the hospital before she had even gotten evaluated. Appeared to be transiently irrational. When she got some pain medicine she calmed down a great deal. I did not find any evidence of depression psychosis or  any other acutely treatable mental health problem. Seemed to just be overwhelmed by her pain. No medication started. On reevaluation today the patient has no new complaints. Reports that she slept adequately last night. Denies feeling depressed. Denies suicidal thoughts. Denies any psychosis. Able to describe the information that she has gotten from her treatment team appropriately.  Past Psychiatric History: Does not appear to have significant past psychiatric history  Risk to Self: Is patient at risk for suicide?: No Risk to Others:   Prior Inpatient Therapy:   Prior Outpatient Therapy:    Past Medical History:  Past Medical History  Diagnosis Date  . DM (diabetes mellitus) ([place])   . HTN (hypertension)   . Diverticulitis   . Arthritis   . Coronary artery disease   . Stroke ([place])   . Short-term memory loss   . Myocardial infarction ([place])   . Memory difficulties 09/01/2015    Past Surgical History  Procedure Laterality Date  . Hemicolectomy    . Lumbar disc surgery    . Roux-en-y gastric bypass    . Cholecystectomy    . Eye surgery    . Hand surgery    . Foot surgery    . Abdominal hysterectomy      due to endometriosis-1 ovary left  . Tonsillectomy    . Upper gi endoscopy  01/12/16    normal larynx, normal esophagus. gastric bypass with a normal-sized pouch and intact staple line, normal examined jejunum, otherwise exam was normal   Family History:  Family History  Problem Relation Age of Onset  . Cancer Father 40    Lung  . Coronary artery disease Father 31  . COPD Mother   .  Diabetes Mother   . Hypertension Mother   . Cancer Paternal Aunt     Breast  . Cancer Paternal Grandmother     Breast  . Congestive Heart Failure Maternal Grandmother   . Emphysema Maternal Grandfather   . Heart attack Paternal Grandfather    Family Psychiatric  History: No family history identified of mental health problems Social History:  History  Alcohol Use No     History  Drug  Use No    Social History   Social History  . Marital Status: Married    Spouse Name: N/A  . Number of Children: 3  . 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 Smoking status: Never Smoker   . Smokeless tobacco: Never Used  . Alcohol Use: No  . Drug Use: No  . Sexual Activity: Not Asked   Other Topics Concern  . None   Social History Narrative   Lives with husband, daughter and granddaughter.;   Patient drinks about 3 cups of caffeine daily.   Patient is left handed.    Additional Social History:    Allergies:   Allergies  Allergen Reactions  . Lotrel [Amlodipine Besy-Benazepril Hcl] Anaphylaxis  . Contrast Media [Iodinated Diagnostic Agents] Swelling and Other (See Comments)    Reaction:  Neck swelling   . Niacin Hives  . Sulfa Antibiotics Hives    Labs:  Results for orders placed or performed during the hospital encounter of 04/23/16 (from the past 48 hour(s))  Comprehensive metabolic panel     Status: Abnormal   Collection Time: 04/23/16 12:40 PM  Result Value Ref Range   Sodium 144 135 - 145 mmol/L   Potassium 3.6 3.5 - 5.1 mmol/L   Chloride 109 101 - 111 mmol/L   CO2 25 22 - 32 mmol/L   Glucose, Bld 104 (H) 65 - 99 mg/dL   BUN 10 6 - 20 mg/dL   Creatinine, Ser 1.07 (H) 0.44 - 1.00 mg/dL   Calcium 10.1 8.9 - 10.3 mg/dL   Total Protein 7.9 6.5 - 8.1 g/dL   Albumin 4.2 3.5 - 5.0 g/dL   AST 19 15 - 41 U/L   ALT 10 (L) 14 - 54 U/L   Alkaline Phosphatase 103 38 - 126 U/L   Total Bilirubin 0.6 0.3 - 1.2 mg/dL   GFR calc non Af Amer 54 (L) >60 mL/min   GFR calc Af Amer >60 >60 mL/min    Comment: (NOTE) The eGFR has been calculated using the CKD EPI equation. This calculation has not been validated in all clinical situations. eGFR's persistently <60 mL/min signify possible Chronic Kidney Disease.    Anion gap 10 5 - 15  CBC with Differential     Status: Abnormal   Collection Time: 04/23/16 12:40 PM   Result Value Ref Range   WBC 8.6 3.6 - 11.0 K/uL   RBC 4.42 3.80 - 5.20 MIL/uL   Hemoglobin 11.4 (L) 12.0 - 16.0 g/dL   HCT 35.5 35.0 - 47.0 %   MCV 80.3 80.0 - 100.0 fL   MCH 25.9 (L) 26.0 - 34.0 pg   MCHC 32.2 32.0 - 36.0 g/dL   RDW 15.8 (H) 11.5 - 14.5 %   Platelets 389 150 - 440 K/uL   Neutrophils Relative % 68% %   Neutro Abs 5.8 1.4 - 6.5 K/uL   Lymphocytes Relative 25% %   Lymphs Abs 2.1 1.0 -  3.6 K/uL   Monocytes Relative 6% %   Monocytes Absolute 0.5 0.2 - 0.9 K/uL   Eosinophils Relative 0% %   Eosinophils Absolute 0.0 0 - 0.7 K/uL   Basophils Relative 1% %   Basophils Absolute 0.0 0 - 0.1 K/uL  Lipase, blood     Status: None   Collection Time: 04/23/16 12:40 PM  Result Value Ref Range   Lipase 26 11 - 51 U/L  Hemoglobin A1c     Status: None   Collection Time: 04/23/16 12:40 PM  Result Value Ref Range   Hgb A1c MFr Bld 5.9 4.0 - 6.0 %  Glucose, capillary     Status: None   Collection Time: 04/23/16  9:54 PM  Result Value Ref Range   Glucose-Capillary 78 65 - 99 mg/dL  Glucose, capillary     Status: None   Collection Time: 04/23/16 11:49 PM  Result Value Ref Range   Glucose-Capillary 74 65 - 99 mg/dL  Basic metabolic panel     Status: None   Collection Time: 04/24/16  5:26 AM  Result Value Ref Range   Sodium 144 135 - 145 mmol/L   Potassium 4.2 3.5 - 5.1 mmol/L   Chloride 110 101 - 111 mmol/L   CO2 29 22 - 32 mmol/L   Glucose, Bld 88 65 - 99 mg/dL   BUN 7 6 - 20 mg/dL   Creatinine, Ser 0.86 0.44 - 1.00 mg/dL   Calcium 9.0 8.9 - 10.3 mg/dL   GFR calc non Af Amer >60 >60 mL/min   GFR calc Af Amer >60 >60 mL/min    Comment: (NOTE) The eGFR has been calculated using the CKD EPI equation. This calculation has not been validated in all clinical situations. eGFR's persistently <60 mL/min signify possible Chronic Kidney Disease.    Anion gap 5 5 - 15  CBC     Status: Abnormal   Collection Time: 04/24/16  5:26 AM  Result Value Ref Range   WBC 7.5 3.6 - 11.0  K/uL   RBC 3.77 (L) 3.80 - 5.20 MIL/uL   Hemoglobin 10.0 (L) 12.0 - 16.0 g/dL   HCT 30.2 (L) 35.0 - 47.0 %   MCV 80.2 80.0 - 100.0 fL   MCH 26.5 26.0 - 34.0 pg   MCHC 33.0 32.0 - 36.0 g/dL   RDW 15.5 (H) 11.5 - 14.5 %   Platelets 307 150 - 440 K/uL  Urinalysis complete, with microscopic     Status: Abnormal   Collection Time: 04/24/16  6:47 AM  Result Value Ref Range   Color, Urine AMBER (A) YELLOW   APPearance HAZY (A) CLEAR   Glucose, UA NEGATIVE NEGATIVE mg/dL   Bilirubin Urine NEGATIVE NEGATIVE   Ketones, ur TRACE (A) NEGATIVE mg/dL   Specific Gravity, Urine 1.017 1.005 - 1.030   Hgb urine dipstick 1+ (A) NEGATIVE   pH 5.0 5.0 - 8.0   Protein, ur NEGATIVE NEGATIVE mg/dL   Nitrite POSITIVE (A) NEGATIVE   Leukocytes, UA 1+ (A) NEGATIVE   RBC / HPF 0-5 0 - 5 RBC/hpf   WBC, UA 6-30 0 - 5 WBC/hpf   Bacteria, UA MANY (A) NONE SEEN   Squamous Epithelial / LPF 0-5 (A) NONE SEEN   Mucous PRESENT    Hyaline Casts, UA PRESENT   Glucose, capillary     Status: None   Collection Time: 04/24/16  7:29 AM  Result Value Ref Range   Glucose-Capillary 74 65 - 99 mg/dL   Comment  1 Notify RN   Glucose, capillary     Status: None   Collection Time: 04/24/16 11:37 AM  Result Value Ref Range   Glucose-Capillary 68 65 - 99 mg/dL   Comment 1 Notify RN   Glucose, capillary     Status: Abnormal   Collection Time: 04/24/16 12:08 PM  Result Value Ref Range   Glucose-Capillary 104 (H) 65 - 99 mg/dL    Current Facility-Administered Medications  Medication Dose Route Frequency Provider Last Rate Last Dose  . 0.9 %  sodium chloride infusion   Intravenous Continuous Lytle Butte, MD 75 mL/hr at 04/23/16 2359    . acetaminophen (TYLENOL) tablet 650 mg  650 mg Oral Q6H PRN Lytle Butte, MD       Or  . acetaminophen (TYLENOL) suppository 650 mg  650 mg Rectal Q6H PRN Lytle Butte, MD      . amLODipine (NORVASC) tablet 10 mg  10 mg Oral Daily Lytle Butte, MD   10 mg at 04/24/16 0919  . bisacodyl  (DULCOLAX) suppository 10 mg  10 mg Rectal Daily PRN Lytle Butte, MD      . diazepam (VALIUM) tablet 5 mg  5 mg Oral Q6H PRN Lytle Butte, MD      . donepezil (ARICEPT) tablet 5 mg  5 mg Oral QHS Lytle Butte, MD   5 mg at 04/23/16 2341  . enoxaparin (LOVENOX) injection 40 mg  40 mg Subcutaneous Q24H Lytle Butte, MD      . gabapentin (NEURONTIN) capsule 100 mg  100 mg Oral TID Lytle Butte, MD   100 mg at 04/24/16 0919  . insulin aspart (novoLOG) injection 0-5 Units  0-5 Units Subcutaneous QHS Lytle Butte, MD   0 Units at 04/23/16 2342  . insulin aspart (novoLOG) injection 0-9 Units  0-9 Units Subcutaneous TID WC Lytle Butte, MD   0 Units at 04/24/16 0800  . morphine 2 MG/ML injection 2 mg  2 mg Intravenous Q4H PRN Lytle Butte, MD   2 mg at 04/24/16 1152  . ondansetron (ZOFRAN) tablet 4 mg  4 mg Oral Q6H PRN Lytle Butte, MD       Or  . ondansetron [facility]) injection 4 mg  4 mg Intravenous Q6H PRN Lytle Butte, MD      . oxyCODONE (Oxy IR/ROXICODONE) immediate release tablet 5 mg  5 mg Oral Q4H PRN Lytle Butte, MD      . prenatal multivitamin tablet 1 tablet  1 tablet Oral Daily Lytle Butte, MD   1 tablet at 04/24/16 0920    Musculoskeletal: Strength & Muscle Tone: decreased Gait & Station: unable to stand Patient leans: N/A  Psychiatric Specialty Exam: Review of Systems  Constitutional: Positive for malaise/fatigue.  HENT: Negative.   Eyes: Negative.   Respiratory: Negative.   Cardiovascular: Negative.   Gastrointestinal: Positive for abdominal pain.  Musculoskeletal: Negative.   Skin: Negative.   Neurological: Negative.   Psychiatric/Behavioral: Negative for depression, suicidal ideas, hallucinations, memory loss and substance abuse. The patient is not nervous/anxious and does not have insomnia.     Blood pressure 100/52, pulse 66, temperature 98.3 F (36.8 C), temperature source Oral, resp. rate 16, height _0  (1.6 m), weight 66.361 kg (146 lb 4.8 oz), SpO2  94 %.Body mass index is 25.92 kg/(m^2).  General Appearance: Fairly Groomed  Engineer, water::  Good  Speech:  Slow  Volume:  Decreased  Mood:  Euthymic  Affect:  Constricted  Thought Process:  Goal Directed  Orientation:  Full (Time, Place, and Person)  Thought Content:  Negative  Suicidal Thoughts:  No  Homicidal Thoughts:  No  Memory:  Immediate;   Good Recent;   Fair Remote;   Fair  Judgement:  Intact  Insight:  Fair  Psychomotor Activity:  Normal  Concentration:  Fair  Recall:  [employer] of Knowledge:Fair  Language: Fair  Akathisia:  No  Handed:  Right  AIMS (if indicated):     Assets:  Communication Skills Desire for Improvement Financial Resources/Insurance Housing Resilience  ADL's:  Intact  Cognition: WNL  Sleep:      Treatment Plan Summary: Plan 65 year old woman who was having an anxiety attack and a bit of a breakdown when she was in pain yesterday in the emergency room. Symptoms disappeared once her pain was treated. Patient is not complaining of any mood symptoms or psychosis and is not suicidal. Supportive counseling done. No need for any medication or further involvement. I will sign off for now. Please call me if I can be of any future help.  Disposition: Patient does not meet criteria for psychiatric inpatient admission.  Alethia Berthold, MD 04/24/2016 3:52 PM

## 2016-04-24 NOTE — Consult Note (Signed)
Patient ID: Julie Acevedo, female   DOB: 05/24/1951, 65 y.o.   MRN: [id number]  HPI Julie Acevedo is a 65 y.o. female asked to see in consultation for abdominal pain. She gives a vague history of abdominal pain for several months intermittent and severe diffuse. Nonspecific out of eating or aggravating factors. She apparently was nauseated she did have a small bowel movement yesterday and did pass gas yesterday. Per the nurses she is hungry and once to eat and drink something. Her past surgical history significant for gastric bypass perform at [facility] 2011 by Dr. Excell Seltzer. He is actually seen her in the last couple months and also GI has seen her with a normal EGD as per patient reports. I am CT scan personally reviewed and there is some questionable mild dilation of the small bowel I do not see a definitive transition area but the radiologists pointing up questionable transition area down the pelvis. No evidence of a central signed O evidence of pneumatosis and no evidence of free air:  HPI  Past Medical History  Diagnosis Date  . DM (diabetes mellitus) ([place])   . HTN (hypertension)   . Diverticulitis   . Arthritis   . Coronary artery disease   . Stroke ([place])   . Short-term memory loss   . Myocardial infarction ([place])   . Memory difficulties 09/01/2015    Past Surgical History  Procedure Laterality Date  . Hemicolectomy    . Lumbar disc surgery    . Roux-en-y gastric bypass    . Cholecystectomy    . Eye surgery    . Hand surgery    . Foot surgery    . Abdominal hysterectomy      due to endometriosis-1 ovary left  . Tonsillectomy    . Upper gi endoscopy  01/12/16    normal larynx, normal esophagus. gastric bypass with a normal-sized pouch and intact staple line, normal examined jejunum, otherwise exam was normal    Family History  Problem Relation Age of Onset  . Cancer Father 51    Lung  . Coronary artery disease Father 36  . COPD Mother   . Diabetes Mother   . Hypertension  Mother   . Cancer Paternal Aunt     Breast  . Cancer Paternal Grandmother     Breast  . Congestive Heart Failure Maternal Grandmother   . Emphysema Maternal Grandfather   . Heart attack Paternal Grandfather     Social History Social History  Substance Use Topics  . Smoking status: Never Smoker   . Smokeless tobacco: Never Used  . Alcohol Use: No    Allergies  Allergen Reactions  . Lotrel [Amlodipine Besy-Benazepril Hcl] Anaphylaxis  . Contrast Media [Iodinated Diagnostic Agents] Swelling and Other (See Comments)    Reaction:  Neck swelling   . Niacin Hives  . Sulfa Antibiotics Hives    Current Facility-Administered Medications  Medication Dose Route Frequency Provider Last Rate Last Dose  . 0.9 %  sodium chloride infusion   Intravenous Continuous Lytle Butte, MD 75 mL/hr at 04/23/16 2359    . acetaminophen (TYLENOL) tablet 650 mg  650 mg Oral Q6H PRN Lytle Butte, MD       Or  . acetaminophen (TYLENOL) suppository 650 mg  650 mg Rectal Q6H PRN Lytle Butte, MD      . amLODipine (NORVASC) tablet 10 mg  10 mg Oral Daily Lytle Butte, MD   10 mg at 04/24/16 0919  .  bisacodyl (DULCOLAX) suppository 10 mg  10 mg Rectal Daily PRN Lytle Butte, MD      . diazepam (VALIUM) tablet 5 mg  5 mg Oral Q6H PRN Lytle Butte, MD      . donepezil (ARICEPT) tablet 5 mg  5 mg Oral QHS Lytle Butte, MD   5 mg at 04/23/16 2341  . enoxaparin (LOVENOX) injection 40 mg  40 mg Subcutaneous Q24H Lytle Butte, MD      . gabapentin (NEURONTIN) capsule 100 mg  100 mg Oral TID Lytle Butte, MD   100 mg at 04/24/16 1700  . insulin aspart (novoLOG) injection 0-5 Units  0-5 Units Subcutaneous QHS Lytle Butte, MD   0 Units at 04/23/16 2342  . insulin aspart (novoLOG) injection 0-9 Units  0-9 Units Subcutaneous TID WC Lytle Butte, MD   0 Units at 04/24/16 0800  . morphine 2 MG/ML injection 2 mg  2 mg Intravenous Q4H PRN Lytle Butte, MD   2 mg at 04/24/16 1700  . ondansetron (ZOFRAN) tablet 4 mg   4 mg Oral Q6H PRN Lytle Butte, MD       Or  . ondansetron [facility]) injection 4 mg  4 mg Intravenous Q6H PRN Lytle Butte, MD      . oxyCODONE (Oxy IR/ROXICODONE) immediate release tablet 5 mg  5 mg Oral Q4H PRN Lytle Butte, MD      . prenatal multivitamin tablet 1 tablet  1 tablet Oral Daily Lytle Butte, MD   1 tablet at 04/24/16 0920     Review of Systems A 10 point review of systems was asked and was negative except for the information on the HPI  Physical Exam Blood pressure 100/52, pulse 66, temperature 98.3 F (36.8 C), temperature source Oral, resp. rate 16, height 5\' 3"  (1.6 m), weight 66.361 kg (146 lb 4.8 oz), SpO2 94 %. CONSTITUTIONAL:NAD EYES: Pupils are equal, round, and reactive to light, Sclera are non-icteric. EARS, NOSE, MOUTH AND THROAT: The oropharynx is clear. The oral mucosa is pink and moist. Hearing is intact to voice. LYMPH NODES:  Lymph nodes in the neck are normal. RESPIRATORY:  Lungs are clear. There is normal respiratory effort, with equal breath sounds bilaterally, and without pathologic use of accessory muscles. CARDIOVASCULAR: Heart is regular without murmurs, gallops, or rubs. GI: The abdomen is* soft, nontender, and nondistended. There are no palpable masses. There is no hepatosplenomegaly. There are normal bowel sounds in all quadrants. GU: Rectal deferred.   MUSCULOSKELETAL: Normal muscle strength and tone. No cyanosis or edema.   SKIN: Turgor is good and there are no pathologic skin lesions or ulcers. NEUROLOGIC: Motor and sensation is grossly normal. Cranial nerves are grossly intact. PSYCH:  Oriented to person, place and time. Affect is normal.  Data Reviewed  I have personally reviewed the patient's imaging, laboratory findings and medical records.    Assessment/Plan Ileus, keep NPo , IV hydration. Her surgical indication at this time. Obviously positive consideration is an internal hernia and this is very unlikely given that her symptoms  have been present for the last several months. Discussed with the patient in detail and I gave her the option of transfering her to [facility] were Dr. Excell Seltzer is. She refuses to F/U w him. Also offered her the option to transfer to [facility] to be evaluated by the bariatric team and she wishes to stay here. As I stated I do not think is  an internal hernia. We can follow her w serial abd exams and films.  No need for immediate surgical procedures at this time. Caroleen Hamman, MD FACS General Surgeon 04/24/2016, 5:37 PM

## 2016-04-25 DIAGNOSIS — K56609 Unspecified intestinal obstruction, unspecified as to partial versus complete obstruction: Secondary | ICD-10-CM | POA: Insufficient documentation

## 2016-04-25 LAB — GLUCOSE, CAPILLARY
Glucose-Capillary: 75 mg/dL (ref 65–99)
Glucose-Capillary: 71 mg/dL (ref 65–99)

## 2016-04-25 MED ORDER — POLYETHYLENE GLYCOL 3350 17 G PO PACK
17.0000 g | PACK | Freq: Two times a day (BID) | ORAL | Status: DC
Start: 2016-04-25 — End: 2016-04-25
  Administered 2016-04-25: 17 g via ORAL
  Filled 2016-04-25: qty 1

## 2016-04-25 NOTE — Progress Notes (Signed)
CC: Abd pain ileus Subjective: Passing gas, KUB showed no obstruction, contrast in colon.  Hungry Some abd pain NML WBC Objective: Vital signs in last 24 hours: Temp:  [98.1 F (36.7 C)-98.6 F (37 C)] 98.6 F (37 C) (05/10 0414) Pulse Rate:  [62-76] 76 (05/10 0414) Resp:  [16] 16 (05/10 0414) BP: (92-107)/(50-70) 107/70 mmHg (05/10 0414) SpO2:  [94 %-97 %] 94 % (05/10 0414) Last BM Date: 04/23/16  Intake/Output from previous day: 05/09 0701 - 05/10 0700 In: 1427.4 [I.V.:1427.4] Out: 950 [Urine:950] Intake/Output this shift: Total I/O In: 414 [P.O.:414] Out: -   Physical exam: NAD, alert Abd: soft, NT, no peritonitis Ext: no edema   Lab Results: CBC   Recent Labs  04/23/16 1240 04/24/16 0526  WBC 8.6 7.5  HGB 11.4* 10.0*  HCT 35.5 30.2*  PLT 389 307   BMET  Recent Labs  04/23/16 1240 04/24/16 0526  NA 144 144  K 3.6 4.2  CL 109 110  CO2 25 29  GLUCOSE 104* 88  BUN 10 7  CREATININE 1.07* 0.86  CALCIUM 10.1 9.0   PT/INR No results for input(s): LABPROT, INR in the last 72 hours. ABG No results for input(s): PHART, HCO3 in the last 72 hours.  Invalid input(s): PCO2, PO2  Studies/Results: Ct Abdomen Pelvis Wo Contrast  04/23/2016  CLINICAL DATA:  Abdominal pain.  History of gastric bypass. EXAM: CT ABDOMEN AND PELVIS WITHOUT CONTRAST TECHNIQUE: Multidetector CT imaging of the abdomen and pelvis was performed following the standard protocol without IV contrast. COMPARISON:  12/14/2015 FINDINGS: Postcholecystectomy Ventral hernia containing adipose tissue is stable. Postoperative changes from gastric bypass surgery are noted. Spleen, liver, adrenal glands, and left kidney are within normal limits. Mild right hydronephrosis is stable Dilated small bowel loops are present with air-fluid levels. Distal small bowel and colon are decompressed. There is a suspected transition point in the ileum at the mid pelvic brim on image 56. There is no focal mass. L4-5  fusion with pedicle screws is noted. No breakage or loosening of the hardware. There is no free fluid and there is no evidence of abnormal retroperitoneal adenopathy. IMPRESSION: Partial small bowel obstruction pattern. Transition point occurs in the distal ileum. Postoperative changes as described. Stable right hydronephrosis. Electronically Signed   By: Marybelle Killings M.D.   On: 04/23/2016 16:42   Dg Abd Acute W/chest  04/24/2016  CLINICAL DATA:  Pt admitted yesterday for SBO; pt kept grabbing her lower abdomen due to pain; pt states this has happened to her before; Hysterectomy, gallbladder, lumbar disc surgery; Diabetes, htn, CAD, stroke EXAM: DG ABDOMEN ACUTE W/ 1V CHEST COMPARISON:  CT 04/23/2016 FINDINGS: Heart size and mediastinal contours are within normal limits. Lungs are clear. No effusion. No free air. Small bowel decompressed. Residual oral contrast material throughout the colon which is non distended. Scattered sigmoid diverticula are evident. Bilateral pelvic phleboliths. Surgical clips near the GE junction and in the right upper abdomen. Lumbar fixation hardware L4-5 as before. IMPRESSION: 1. Nonobstructive bowel gas pattern with residual colonic contrast. 2. No acute cardiopulmonary disease. Electronically Signed   By: Lucrezia Europe M.D.   On: 04/24/2016 16:13    Anti-infectives: Anti-infectives    None      Assessment/Plan: Resolving ileus Advance diet Add miralax No surgical intervention May consider GI eval for chronic pain We will be available  Caroleen Hamman, MD, FACS  04/25/2016

## 2016-04-25 NOTE — Progress Notes (Signed)
04/25/2016 12:23 PM  BP 107/70 mmHg  Pulse 76  Temp(Src) 98.6 F (37 C) (Oral)  Resp 16  Ht 5\' 3"  (1.6 m)  Wt 66.361 kg (146 lb 4.8 oz)  BMI 25.92 kg/m2  SpO2 94% Patient discharged per MD orders. Discharge instructions reviewed with patient and patient verbalized understanding. IV's removed per policy. Discharged ambulatory escorted by significant other.  Almedia Balls, RN

## 2016-04-25 NOTE — Discharge Summary (Signed)
[facility] NAME: Julie Acevedo    MR#:  [id number]  DATE OF BIRTH:  1951-09-10  DATE OF ADMISSION:  04/23/2016 ADMITTING PHYSICIAN: Lytle Butte, MD  DATE OF DISCHARGE: 04/25/2016  PRIMARY CARE PHYSICIAN: [name], MD    ADMISSION DIAGNOSIS:  Suicidal ideation [R45.851] Generalized abdominal pain [R10.84] Partial small bowel obstruction (HCC) [K56.69]  DISCHARGE DIAGNOSIS:  Active Problems:   Partial small bowel obstruction (HCC)   Adjustment disorder with mixed anxiety and depressed mood   SBO (small bowel obstruction) ([place])   SECONDARY DIAGNOSIS:   Past Medical History  Diagnosis Date  . DM (diabetes mellitus) ([place])   . HTN (hypertension)   . Diverticulitis   . Arthritis   . Coronary artery disease   . Stroke ([place])   . Short-term memory loss   . Myocardial infarction ([place])   . Memory difficulties 09/01/2015    HOSPITAL COURSE:   65 year old female with past medical history of dementia, diabetes, hypertension, previous history of diverticulitis, previous CVA who presented to the hospital due to abdominal pain nausea and vomiting and noted to have partial small bowel obstruction.  1. Partial small bowel obstruction/Ileus-this was the cause of patient's abdominal pain nausea vomiting. -Patient was seen by general surgery who did not think that the patient needed acute surgical intervention. She was treated supportively with IV fluids, antiemetics, pain control and bowel rest. -she underwent a abdominal x-ray which showed contrast throughout the colon and without any obstruction. She was tolerated on a diet and did well without any N/v.  She still has some abdominal pain which seems to be chronic and can be further followed as outpatient.   2. Diabetes type 2 without complication-while in the hospital patient was on sliding scale insulin. She will resume her metformin upon discharge.  3. Diabetic neuropathy- she  will continue gabapentin.  4. History of dementia- she will continue Aricept.  5. Hypertension- she will continue Norvasc.  DISCHARGE CONDITIONS:   Stable  CONSULTS OBTAINED:  Treatment Team:  Lytle Butte, MD Jules Husbands, MD  DRUG ALLERGIES:   Allergies  Allergen Reactions  . Lotrel [Amlodipine Besy-Benazepril Hcl] Anaphylaxis  . Contrast Media [Iodinated Diagnostic Agents] Swelling and Other (See Comments)    Reaction:  Neck swelling   . Niacin Hives  . Sulfa Antibiotics Hives    DISCHARGE MEDICATIONS:   Current Discharge Medication List    CONTINUE these medications which have NOT CHANGED   Details  amLODipine (NORVASC) 10 MG tablet Take 1 tablet (10 mg total) by mouth daily. Qty: 90 tablet, Refills: 2   Associated Diagnoses: Essential hypertension    diazepam (VALIUM) 5 MG tablet Take 2.5-5 mg by mouth every 6 (six) hours as needed for anxiety.    gabapentin (NEURONTIN) 100 MG capsule Take 1 capsule (100 mg total) by mouth 3 (three) times daily. Qty: 90 capsule, Refills: 3   Associated Diagnoses: Lumbar radiculopathy    linaclotide (LINZESS) 145 MCG CAPS capsule Take 145 mcg by mouth daily before breakfast.    Melatonin 10 MG TABS Take 10 mg by mouth at bedtime.    metFORMIN (GLUCOPHAGE) 500 MG tablet Take 1 tablet (500 mg total) by mouth 2 (two) times daily with a meal. Qty: 180 tablet, Refills: 2    Prenatal Vit-Fe Fumarate-FA (PRENATAL MULTIVITAMIN) TABS tablet Take 1 tablet by mouth daily.    promethazine (PHENERGAN) 12.5 MG tablet Take 1 tablet (12.5 mg total)  by mouth every 8 (eight) hours as needed for nausea or vomiting. Qty: 20 tablet, Refills: 0      STOP taking these medications     donepezil (ARICEPT) 5 MG tablet          DISCHARGE INSTRUCTIONS:   DIET:  Cardiac diet and Diabetic diet  DISCHARGE CONDITION:  Stable  ACTIVITY:  Activity as tolerated  OXYGEN:  Home Oxygen: No.   Oxygen Delivery: room air  DISCHARGE  LOCATION:  home   If you experience worsening of your admission symptoms, develop shortness of breath, life threatening emergency, suicidal or homicidal thoughts you must seek medical attention immediately by calling 911 or calling your MD immediately  if symptoms less severe.  You Must read complete instructions/literature along with all the possible adverse reactions/side effects for all the Medicines you take and that have been prescribed to you. Take any new Medicines after you have completely understood and accpet all the possible adverse reactions/side effects.   Please note  You were cared for by a hospitalist during your hospital stay. If you have any questions about your discharge medications or the care you received while you were in the hospital after you are discharged, you can call the unit and asked to speak with the hospitalist on call if the hospitalist that took care of you is not available. Once you are discharged, your primary care physician will handle any further medical issues. Please note that NO REFILLS for any discharge medications will be authorized once you are discharged, as it is imperative that you return to your primary care physician (or establish a relationship with a primary care physician if you do not have one) for your aftercare needs so that they can reassess your need for medications and monitor your lab values.     Today   Still having abdominal pain but No N/V. X-ray this a.m. Showing no evidence of obstruction.  Tolerating diet well.   VITAL SIGNS:  Blood pressure 107/70, pulse 76, temperature 98.6 F (37 C), temperature source Oral, resp. rate 16, height 5\' 3"  (1.6 m), weight 66.361 kg (146 lb 4.8 oz), SpO2 94 %.  I/O:   Intake/Output Summary (Last 24 hours) at 04/25/16 1311 Last data filed at 04/25/16 0800  Gross per 24 hour  Intake 1987.5 ml  Output   1250 ml  Net  737.5 ml    PHYSICAL EXAMINATION:  GENERAL:  65 y.o.-year-old patient lying  in the bed with no acute distress.  EYES: Pupils equal, round, reactive to light and accommodation. No scleral icterus. Extraocular muscles intact.  HEENT: Head atraumatic, normocephalic. Oropharynx and nasopharynx clear.  NECK:  Supple, no jugular venous distention. No thyroid enlargement, no tenderness.  LUNGS: Normal breath sounds bilaterally, no wheezing, rales,rhonchi. No use of accessory muscles of respiration.  CARDIOVASCULAR: S1, S2 normal. No murmurs, rubs, or gallops.  ABDOMEN: Soft, Tender diffusely but no rebound, rigidity, non-distended. Bowel sounds present. No organomegaly or mass.  EXTREMITIES: No pedal edema, cyanosis, or clubbing.  NEUROLOGIC: Cranial nerves II through XII are intact. No focal motor or sensory defecits b/l.  PSYCHIATRIC: The patient is alert and oriented x 3. Good affect.  SKIN: No obvious rash, lesion, or ulcer.   DATA REVIEW:   CBC  Recent Labs Lab 04/24/16 0526  WBC 7.5  HGB 10.0*  HCT 30.2*  PLT 307    Chemistries   Recent Labs Lab 04/23/16 1240 04/24/16 0526  NA 144 144  K 3.6 4.2  CL 109 110  CO2 25 29  GLUCOSE 104* 88  BUN 10 7  CREATININE 1.07* 0.86  CALCIUM 10.1 9.0  AST 19  --   ALT 10*  --   ALKPHOS 103  --   BILITOT 0.6  --     Cardiac Enzymes No results for input(s): TROPONINI in the last 168 hours.  Microbiology Results  Results for orders placed or performed in visit on 06/28/15  Microscopic Examination     Status: None   Collection Time: 06/28/15 10:55 AM  Result Value Ref Range Status   WBC, UA 0-5 0 -  5 /hpf Final   RBC, UA 0-2 0 -  2 /hpf Final   Epithelial Cells (non renal) 0-10 0 - 10 /hpf Final   Bacteria, UA None seen None seen/Few Final    RADIOLOGY:  Ct Abdomen Pelvis Wo Contrast  04/23/2016  CLINICAL DATA:  Abdominal pain.  History of gastric bypass. EXAM: CT ABDOMEN AND PELVIS WITHOUT CONTRAST TECHNIQUE: Multidetector CT imaging of the abdomen and pelvis was performed following the standard  protocol without IV contrast. COMPARISON:  12/14/2015 FINDINGS: Postcholecystectomy Ventral hernia containing adipose tissue is stable. Postoperative changes from gastric bypass surgery are noted. Spleen, liver, adrenal glands, and left kidney are within normal limits. Mild right hydronephrosis is stable Dilated small bowel loops are present with air-fluid levels. Distal small bowel and colon are decompressed. There is a suspected transition point in the ileum at the mid pelvic brim on image 56. There is no focal mass. L4-5 fusion with pedicle screws is noted. No breakage or loosening of the hardware. There is no free fluid and there is no evidence of abnormal retroperitoneal adenopathy. IMPRESSION: Partial small bowel obstruction pattern. Transition point occurs in the distal ileum. Postoperative changes as described. Stable right hydronephrosis. Electronically Signed   By: Marybelle Killings M.D.   On: 04/23/2016 16:42   Dg Abd Acute W/chest  04/24/2016  CLINICAL DATA:  Pt admitted yesterday for SBO; pt kept grabbing her lower abdomen due to pain; pt states this has happened to her before; Hysterectomy, gallbladder, lumbar disc surgery; Diabetes, htn, CAD, stroke EXAM: DG ABDOMEN ACUTE W/ 1V CHEST COMPARISON:  CT 04/23/2016 FINDINGS: Heart size and mediastinal contours are within normal limits. Lungs are clear. No effusion. No free air. Small bowel decompressed. Residual oral contrast material throughout the colon which is non distended. Scattered sigmoid diverticula are evident. Bilateral pelvic phleboliths. Surgical clips near the GE junction and in the right upper abdomen. Lumbar fixation hardware L4-5 as before. IMPRESSION: 1. Nonobstructive bowel gas pattern with residual colonic contrast. 2. No acute cardiopulmonary disease. Electronically Signed   By: Lucrezia Europe M.D.   On: 04/24/2016 16:13      Management plans discussed with the patient, family and they are in agreement.  CODE STATUS:     Code Status  Orders        Start     Ordered   04/23/16 1754  Full code   Continuous     04/23/16 1754    Code Status History    Date Active Date Inactive Code Status Order ID Comments User Context   11/15/2012 11:15 PM 11/18/2012  8:42 PM Full Code [id number]  Aliene Beams, RN Inpatient   01/27/2012  7:14 AM 01/28/2012  8:51 PM Full Code [id number]  Tresa Endo, RN Inpatient      TOTAL TIME TAKING CARE OF THIS PATIENT: 40 minutes.    Henreitta Leber  M.D on 04/25/2016 at 1:11 PM  Between 7am to 6pm - Pager - [phone]  After 6pm go to [email or web address] - password [email or web address] [facility]  Office  [phone]  CC: Primary care physician; [name], MD

## 2016-04-25 NOTE — Care Management Important Message (Signed)
Important Message  Patient Details  Name: Julie Acevedo MRN: [id number] Date of Birth: May 03, 1951   Medicare Important Message Given:  Yes    [name] 04/25/2016, 10:16 AM

## 2016-05-01 ENCOUNTER — Ambulatory Visit (INDEPENDENT_AMBULATORY_CARE_PROVIDER_SITE_OTHER): Payer: Medicare PPO | Admitting: Family Medicine

## 2016-05-01 ENCOUNTER — Encounter: Payer: Self-pay | Admitting: Family Medicine

## 2016-05-01 VITALS — BP 98/60 | HR 64 | Temp 97.7°F | Resp 16 | Wt 151.0 lb

## 2016-05-01 DIAGNOSIS — R109 Unspecified abdominal pain: Secondary | ICD-10-CM

## 2016-05-01 DIAGNOSIS — K5669 Other intestinal obstruction: Secondary | ICD-10-CM | POA: Diagnosis not present

## 2016-05-01 DIAGNOSIS — K439 Ventral hernia without obstruction or gangrene: Secondary | ICD-10-CM | POA: Diagnosis not present

## 2016-05-01 DIAGNOSIS — K56609 Unspecified intestinal obstruction, unspecified as to partial versus complete obstruction: Secondary | ICD-10-CM

## 2016-05-01 MED ORDER — KETOROLAC TROMETHAMINE 60 MG/2ML IJ SOLN: 60.0000 mg | Freq: Once | INTRAMUSCULAR | Status: DC

## 2016-05-01 MED ORDER — KETOROLAC TROMETHAMINE 60 MG/2ML IM SOLN
60.0000 mg | Freq: Once | INTRAMUSCULAR | Status: AC
  Administered 2016-05-01: 60 mg via INTRAMUSCULAR

## 2016-05-01 NOTE — Progress Notes (Signed)
Patient ID: Julie Acevedo, female   DOB: 04/11/1951, 65 y.o.   MRN: [id number]    Subjective:  HPI Pt is here for a hospital follow up. She was admitted on 04/23/16 and discharged on 04/25/16.  Dx- Suicidal ideation (stated in the ER, " if I had a gun I would blow my brains out" because she was in so much pain), generalized abdominal pain, partial small bowel obstruction.  Test- CT showed partial small bowel obstruction, labs  Pt reports that she is feeling better. Her stomach still hurts some and her bowel movements are not normal yet. She has episodes of diarrhea and constipation since the hospital. No further nausea or vomiting. She has not had an appetite since she came home. She was 89 when she was in the office on 02/28/16.   She reports that she emotionally feels all right. Mother's Day is a hard time for her and she was feeling sad over the weekend but the episode in the hospital was because she was in so much pain and they did not give her any pain medication and pt was getting angry because she was hurting so bad. " No, I love myself i would not do that".     Prior to Admission medications   Medication Sig Start Date End Date Taking? Authorizing Provider  amLODipine (NORVASC) 10 MG tablet Take 1 tablet (10 mg total) by mouth daily. 11/03/15  Yes Samia Kukla Maceo Pro., MD  diazepam (VALIUM) 5 MG tablet Take 2.5-5 mg by mouth every 6 (six) hours as needed for anxiety.   Yes Historical Provider, MD  gabapentin (NEURONTIN) 100 MG capsule Take 1 capsule (100 mg total) by mouth 3 (three) times daily. 02/21/16  Yes Halsey Persaud Maceo Pro., MD  linaclotide [facility]) 145 MCG CAPS capsule Take 145 mcg by mouth daily before breakfast.   Yes Historical Provider, MD  Melatonin 10 MG TABS Take 10 mg by mouth at bedtime.   Yes Historical Provider, MD  metFORMIN (GLUCOPHAGE) 500 MG tablet Take 1 tablet (500 mg total) by mouth 2 (two) times daily with a meal. 11/03/15  Yes Jerrol Banana., MD    Prenatal Vit-Fe Fumarate-FA (PRENATAL MULTIVITAMIN) TABS tablet Take 1 tablet by mouth daily.   Yes Historical Provider, MD  promethazine (PHENERGAN) 12.5 MG tablet Take 1 tablet (12.5 mg total) by mouth every 8 (eight) hours as needed for nausea or vomiting. 02/23/16  Yes Margo Common, PA    Patient Active Problem List   Diagnosis Date Noted  . SBO (small bowel obstruction) ([place])   . Partial small bowel obstruction ([place]) 04/23/2016  . Adjustment disorder with mixed anxiety and depressed mood 04/23/2016  . Memory difficulties 09/01/2015  . Arthritis 06/11/2015  . Back pain, chronic 06/11/2015  . HLD (hyperlipidemia) 06/11/2015  . Cannot sleep 06/11/2015  . L-S radiculopathy 06/11/2015  . Arthritis of knee, degenerative 06/11/2015  . B12 deficiency 06/11/2015  . 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 DM2 (diabetes mellitus, type 2) ([place]) 11/15/2012  . DM (diabetes mellitus) ([place]) 01/27/2012  . HTN (hypertension) 01/27/2012  . Adiposity 11/21/2009  . Avitaminosis D 11/07/2009  . Narrowing of intervertebral disc space 07/25/2009  . Diabetes ([place]) 07/25/2009  . Benign essential HTN 07/25/2009    Past Medical History  Diagnosis Date  . DM (diabetes mellitus) ([place])   . HTN (hypertension)   . Diverticulitis   . Arthritis   .  Coronary artery disease   . Stroke ([place])   . Short-term memory loss   . Myocardial infarction ([place])   . Memory difficulties 09/01/2015    Social History   Social History  . Marital Status: Married    Spouse Name: N/A  . Number of Children: 3  . 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 Smoking status: Never Smoker   . Smokeless tobacco: Never Used  . Alcohol Use: No  . Drug Use: No  . Sexual Activity: Not on file   Other Topics Concern  . Not on file   Social History Narrative   Lives with husband, daughter and  granddaughter.;   Patient drinks about 3 cups of caffeine daily.   Patient is left handed.     Allergies  Allergen Reactions  . Lotrel [Amlodipine Besy-Benazepril Hcl] Anaphylaxis  . Contrast Media [Iodinated Diagnostic Agents] Swelling and Other (See Comments)    Reaction:  Neck swelling   . Niacin Hives  . Sulfa Antibiotics Hives    Review of Systems  Constitutional: Positive for malaise/fatigue.  HENT: Negative.   Eyes: Negative.   Respiratory: Negative.   Cardiovascular: Negative.   Gastrointestinal: Positive for abdominal pain and constipation.  Genitourinary: Negative.   Musculoskeletal: Positive for back pain.  Skin: Negative.   Neurological: Positive for weakness.  Endo/Heme/Allergies: Negative.   Psychiatric/Behavioral: Negative.     Immunization History  Administered Date(s) Administered  . Influenza-Unspecified 09/15/2015  . Pneumococcal Polysaccharide-23 11/17/2012  . Tdap 10/02/2011   Objective:  BP 98/60 mmHg  Pulse 64  Temp(Src) 97.7 F (36.5 C) (Oral)  Resp 16  Wt 151 lb (68.493 kg)  Physical Exam  Constitutional: She is oriented to person, place, and time and well-developed, well-nourished, and in no distress.  Eyes: Conjunctivae and EOM are normal. Pupils are equal, round, and reactive to light.  Neck: Normal range of motion. Neck supple.  Cardiovascular: Normal rate, regular rhythm, normal heart sounds and intact distal pulses.   Pulmonary/Chest: Effort normal and breath sounds normal.  Abdominal: Soft. Bowel sounds are normal. She exhibits no mass. There is tenderness. There is guarding. There is no rebound.  She is tender throughout the abdomen but more so in the epigastrium. She is guarding but no rebound.  Musculoskeletal: Normal range of motion.  Neurological: She is alert and oriented to person, place, and time. She has normal reflexes. Gait normal. GCS score is 15.  Skin: Skin is warm and dry.  Psychiatric: Mood, memory, affect and  judgment normal.    Lab Results  Component Value Date   WBC 7.5 04/24/2016   HGB 10.0* 04/24/2016   HCT 30.2* 04/24/2016   PLT 307 04/24/2016   GLUCOSE 88 04/24/2016   CHOL 154 01/18/2014   TRIG 53 01/18/2014   HDL 74* 01/18/2014   LDLCALC 69 01/18/2014   TSH 2.68 01/18/2014   INR 1.02 10/04/2014   HGBA1C 5.9 04/23/2016    CMP     Component Value Date/Time   NA 144 04/24/2016 0526   NA 140 02/17/2014 1843   NA 142 01/21/2014   K 4.2 04/24/2016 0526   K 2.9* 02/17/2014 1843   CL 110 04/24/2016 0526   CL 106 02/17/2014 1843   CO2 29 04/24/2016 0526   CO2 29 02/17/2014 1843   GLUCOSE 88 04/24/2016 0526   GLUCOSE 95 02/17/2014 1843   BUN 7 04/24/2016 0526   BUN  13 02/17/2014 1843   BUN 10 01/21/2014   CREATININE 0.86 04/24/2016 0526   CREATININE 0.87 02/17/2014 1843   CREATININE 0.9 01/21/2014   CALCIUM 9.0 04/24/2016 0526   CALCIUM 9.4 02/17/2014 1843   PROT 7.9 04/23/2016 1240   PROT 7.5 01/17/2014 1945   ALBUMIN 4.2 04/23/2016 1240   ALBUMIN 3.5 01/17/2014 1945   AST 19 04/23/2016 1240   AST 20 01/17/2014 1945   ALT 10* 04/23/2016 1240   ALT 21 01/17/2014 1945   ALKPHOS 103 04/23/2016 1240   ALKPHOS 116 01/17/2014 1945   BILITOT 0.6 04/23/2016 1240   BILITOT 0.1* 01/17/2014 1945   GFRNONAA >60 04/24/2016 0526   GFRNONAA >60 02/17/2014 1843   GFRAA >60 04/24/2016 0526   GFRAA >60 02/17/2014 1843    Assessment and Plan :  1. SBO (small bowel obstruction) ([place]) She is very dissatisfied after her treatment bypass so I'm not sure that having her see a surgeon will benefit her unless she is facing imminent surgery. She is very distrustful. She is back to a normal diet. Return to clinic 2-4 weeks. 2. Ventral hernia, recurrence not specified Possibly what she is describing. May need referral to surgeon.   3. Abdominal wall pain  - ketorolac (TORADOL) injection 60 mg; Inject 2 mLs (60 mg total) into the vein once.  4. Abdominal pain, unspecified abdominal  location I think the obstructions causing her pain, I think a lot of her pain presently is abdominal wall pain. I think that her anxiety and depression contributing to dealing with this issue. If consultation is needed at presently I think surgery ahead of GI would be appropriate. Hopefully this problem will calm down with treatment and time. - Lipase - H. pylori antibody, IgG - CBC with Differential/Platelet - Comprehensive metabolic panel   Patient was seen and examined by Dr. Miguel Aschoff, and noted scribed by Webb Laws, [place] MD [facility] 05/01/2016 3:17 PM

## 2016-05-02 LAB — COMPREHENSIVE METABOLIC PANEL
ALT: 9 IU/L (ref 0–32)
AST: 14 IU/L (ref 0–40)
Albumin/Globulin Ratio: 1.6 (ref 1.2–2.2)
Albumin: 4.4 g/dL (ref 3.6–4.8)
Alkaline Phosphatase: 113 IU/L (ref 39–117)
BUN/Creatinine Ratio: 14 (ref 12–28)
BUN: 14 mg/dL (ref 8–27)
Bilirubin Total: <0.2 mg/dL (ref 0.0–1.2)
CO2: 26 mmol/L (ref 18–29)
Calcium: 9.7 mg/dL (ref 8.7–10.3)
Chloride: 104 mmol/L (ref 96–106)
Creatinine, Ser: 1.02 mg/dL — ABNORMAL HIGH (ref 0.57–1.00)
GFR calc Af Amer: 68 mL/min/{1.73_m2} (ref >59)
GFR calc non Af Amer: 59 mL/min/{1.73_m2} — ABNORMAL LOW (ref >59)
Globulin, Total: 2.8 g/dL (ref 1.5–4.5)
Glucose: 79 mg/dL (ref 65–99)
Potassium: 4.6 mmol/L (ref 3.5–5.2)
Sodium: 143 mmol/L (ref 134–144)
Total Protein: 7.2 g/dL (ref 6.0–8.5)

## 2016-05-02 LAB — CBC WITH DIFFERENTIAL/PLATELET
Basophils Absolute: 0 10*3/uL (ref 0.0–0.2)
Basos: 0 %
EOS (ABSOLUTE): 0.2 10*3/uL (ref 0.0–0.4)
Eos: 2 %
Hematocrit: 33.8 % — ABNORMAL LOW (ref 34.0–46.6)
Hemoglobin: 10.7 g/dL — ABNORMAL LOW (ref 11.1–15.9)
Immature Grans (Abs): 0 10*3/uL (ref 0.0–0.1)
Immature Granulocytes: 0 %
Lymphocytes Absolute: 3.9 10*3/uL — ABNORMAL HIGH (ref 0.7–3.1)
Lymphs: 38 %
MCH: 26.4 pg — ABNORMAL LOW (ref 26.6–33.0)
MCHC: 31.7 g/dL (ref 31.5–35.7)
MCV: 84 fL (ref 79–97)
Monocytes Absolute: 0.5 10*3/uL (ref 0.1–0.9)
Monocytes: 5 %
Neutrophils Absolute: 5.7 10*3/uL (ref 1.4–7.0)
Neutrophils: 55 %
Platelets: 398 10*3/uL — ABNORMAL HIGH (ref 150–379)
RBC: 4.05 x10E6/uL (ref 3.77–5.28)
RDW: 16 % — ABNORMAL HIGH (ref 12.3–15.4)
WBC: 10.3 10*3/uL (ref 3.4–10.8)

## 2016-05-02 LAB — LIPASE: Lipase: 37 U/L (ref 0–59)

## 2016-05-02 LAB — H. PYLORI ANTIBODY, IGG: H Pylori IgG: 1.5 U/mL — ABNORMAL HIGH (ref 0.0–0.8)

## 2016-05-31 ENCOUNTER — Ambulatory Visit (INDEPENDENT_AMBULATORY_CARE_PROVIDER_SITE_OTHER): Payer: Medicare PPO | Admitting: Family Medicine

## 2016-05-31 VITALS — BP 98/62 | HR 78 | Temp 98.1°F | Resp 14 | Wt 145.0 lb

## 2016-05-31 DIAGNOSIS — E118 Type 2 diabetes mellitus with unspecified complications: Secondary | ICD-10-CM | POA: Diagnosis not present

## 2016-05-31 DIAGNOSIS — M5416 Radiculopathy, lumbar region: Secondary | ICD-10-CM

## 2016-05-31 DIAGNOSIS — K5669 Other intestinal obstruction: Secondary | ICD-10-CM

## 2016-05-31 DIAGNOSIS — K56609 Unspecified intestinal obstruction, unspecified as to partial versus complete obstruction: Secondary | ICD-10-CM

## 2016-05-31 DIAGNOSIS — R109 Unspecified abdominal pain: Secondary | ICD-10-CM | POA: Diagnosis not present

## 2016-05-31 MED ORDER — PLECANATIDE 3 MG PO TABS: 1.0000 | ORAL_TABLET | Freq: Every day | ORAL | Status: DC

## 2016-05-31 NOTE — Progress Notes (Signed)
Patient ID: Julie Acevedo, female   DOB: [birth year], 65 y.o.   MRN: [id number]    Subjective:  HPI  Patient is here for 1 month follow up. Last visit patient was seen after ER visit. Her diagnoses was SBO. [place]o possibly had ventral hernia. Toradol injection was given in the office. Lipase, H Pylori, CBC and Cmet were checked then also.  Patient is still hurting today. Her bowels are still not normal-diarrhea ever since the obstruction. Not eating much and tries to eat soft foods/creamy foods. She lost 6 lbs since last visit 05/01/16.  Prior to Admission medications   Medication Sig Start Date End Date Taking? Authorizing Provider  amLODipine (NORVASC) 10 MG tablet Take 1 tablet (10 mg total) by mouth daily. 11/03/15   Jerrol Banana., MD  diazepam (VALIUM) 5 MG tablet Take 2.5-5 mg by mouth every 6 (six) hours as needed for anxiety.    Historical Provider, MD  gabapentin (NEURONTIN) 100 MG capsule Take 1 capsule (100 mg total) by mouth 3 (three) times daily. 02/21/16   Katha Kuehne Maceo Pro., MD  linaclotide [facility]) 145 MCG CAPS capsule Take 145 mcg by mouth daily before breakfast.    Historical Provider, MD  Melatonin 10 MG TABS Take 10 mg by mouth at bedtime.    Historical Provider, MD  metFORMIN (GLUCOPHAGE) 500 MG tablet Take 1 tablet (500 mg total) by mouth 2 (two) times daily with a meal. 11/03/15   Jerrol Banana., MD  Prenatal Vit-Fe Fumarate-FA (PRENATAL MULTIVITAMIN) TABS tablet Take 1 tablet by mouth daily.    Historical Provider, MD  promethazine (PHENERGAN) 12.5 MG tablet Take 1 tablet (12.5 mg total) by mouth every 8 (eight) hours as needed for nausea or vomiting. 02/23/16   Margo Common, PA    Patient Active Problem List   Diagnosis Date Noted  . SBO (small bowel obstruction) ([place])   . Partial small bowel obstruction ([place]) 04/23/2016  . Adjustment disorder with mixed anxiety and depressed mood 04/23/2016  . Memory difficulties 09/01/2015  . Arthritis 06/11/2015    . Back pain, chronic 06/11/2015  . HLD (hyperlipidemia) 06/11/2015  . Cannot sleep 06/11/2015  . L-S radiculopathy 06/11/2015  . Arthritis of knee, degenerative 06/11/2015  . B12 deficiency 06/11/2015  . 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 DM2 (diabetes mellitus, type 2) ([place]) 11/15/2012  . DM (diabetes mellitus) ([place]) 01/27/2012  . HTN (hypertension) 01/27/2012  . Adiposity 11/21/2009  . Avitaminosis D 11/07/2009  . Narrowing of intervertebral disc space 07/25/2009  . Diabetes ([place]) 07/25/2009  . Benign essential HTN 07/25/2009    Past Medical History  Diagnosis Date  . DM (diabetes mellitus) ([place])   . HTN (hypertension)   . Diverticulitis   . Arthritis   . Coronary artery disease   . Stroke ([place])   . Short-term memory loss   . Myocardial infarction ([place])   . Memory difficulties 09/01/2015    Social History   Social History  . Marital Status: Married    Spouse Name: N/A  . Number of Children: 3  . 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 Smoking status: Never Smoker   . Smokeless tobacco: Never Used  . Alcohol Use: No  . Drug Use: No  . Sexual Activity: Not on file   Other Topics Concern  . Not on file  Social History Narrative   Lives with husband, daughter and granddaughter.;   Patient drinks about 3 cups of caffeine daily.   Patient is left handed.     Allergies  Allergen Reactions  . Lotrel [Amlodipine Besy-Benazepril Hcl] Anaphylaxis  . Contrast Media [Iodinated Diagnostic Agents] Swelling and Other (See Comments)    Reaction:  Neck swelling   . Niacin Hives  . Sulfa Antibiotics Hives    Review of Systems  Constitutional: Positive for malaise/fatigue.  HENT: Negative.   Eyes: Negative.   Respiratory: Negative.   Cardiovascular: Negative.   Gastrointestinal: Positive for abdominal pain and diarrhea.  Genitourinary:  Negative.   Musculoskeletal: Positive for back pain and joint pain.  Skin: Negative.   Neurological: Negative.   Endo/Heme/Allergies: Negative.   Psychiatric/Behavioral: The patient is nervous/anxious.     Immunization History  Administered Date(s) Administered  . Influenza-Unspecified 09/15/2015  . Pneumococcal Polysaccharide-23 11/17/2012  . Tdap 10/02/2011   Objective:  BP 98/62 mmHg  Pulse 78  Temp(Src) 98.1 F (36.7 C)  Resp 14  Wt 145 lb (65.772 kg)  Physical Exam  Constitutional: She is oriented to person, place, and time and well-developed, well-nourished, and in no distress.  HENT:  Head: Normocephalic and atraumatic.  Right Ear: External ear normal.  Left Ear: External ear normal.  Nose: Nose normal.  Eyes: Conjunctivae are normal. Pupils are equal, round, and reactive to light.  Neck: Normal range of motion. Neck supple.  Cardiovascular: Normal rate, regular rhythm, normal heart sounds and intact distal pulses.   No murmur heard. Pulmonary/Chest: Effort normal and breath sounds normal. No respiratory distress. She has no wheezes.  Abdominal: She exhibits no distension and no mass. There is tenderness (across entire abdomen and epigastric area is the most tender). There is no rebound and no guarding.  Hyperactive bowel sounds.  Neurological: She is alert and oriented to person, place, and time.  Skin: Skin is warm and dry.  Psychiatric: Mood, memory, affect and judgment normal.    Lab Results  Component Value Date   WBC 10.3 05/01/2016   HGB 10.0* 04/24/2016   HCT 33.8* 05/01/2016   PLT 398* 05/01/2016   GLUCOSE 79 05/01/2016   CHOL 154 01/18/2014   TRIG 53 01/18/2014   HDL 74* 01/18/2014   LDLCALC 69 01/18/2014   TSH 2.68 01/18/2014   INR 1.02 10/04/2014   HGBA1C 5.9 04/23/2016    CMP     Component Value Date/Time   NA 143 05/01/2016 1609   NA 144 04/24/2016 0526   NA 140 02/17/2014 1843   K 4.6 05/01/2016 1609   K 2.9* 02/17/2014 1843   CL  104 05/01/2016 1609   CL 106 02/17/2014 1843   CO2 26 05/01/2016 1609   CO2 29 02/17/2014 1843   GLUCOSE 79 05/01/2016 1609   GLUCOSE 88 04/24/2016 0526   GLUCOSE 95 02/17/2014 1843   BUN 14 05/01/2016 1609   BUN 7 04/24/2016 0526   BUN 13 02/17/2014 1843   CREATININE 1.02* 05/01/2016 1609   CREATININE 0.87 02/17/2014 1843   CREATININE 0.9 01/21/2014   CALCIUM 9.7 05/01/2016 1609   CALCIUM 9.4 02/17/2014 1843   PROT 7.2 05/01/2016 1609   PROT 7.9 04/23/2016 1240   PROT 7.5 01/17/2014 1945   ALBUMIN 4.4 05/01/2016 1609   ALBUMIN 4.2 04/23/2016 1240   ALBUMIN 3.5 01/17/2014 1945   AST 14 05/01/2016 1609   AST 20 01/17/2014 1945   ALT 9 05/01/2016 1609   ALT 21  01/17/2014 1945   ALKPHOS 113 05/01/2016 1609   ALKPHOS 116 01/17/2014 1945   BILITOT <0.2 05/01/2016 1609   BILITOT 0.6 04/23/2016 1240   BILITOT 0.1* 01/17/2014 1945   GFRNONAA 59* 05/01/2016 1609   GFRNONAA >60 02/17/2014 1843   GFRAA 68 05/01/2016 1609   GFRAA >60 02/17/2014 1843    Assessment and Plan :  1. SBO (small bowel obstruction) ([place]) Almost certainly caused by adhesions. Clinically this is improving. 2. Abdominal wall pain Pain is not improving. H Pylori level was elevated on her recent labs in May. Possibly has a stomach ulcer with this level. Will talk with Dr. Dagmar Hait for further treatment plan. Will switch Linzess to Trulance to help constipation. Re check 1 week. Re check labs today 3. Lumbar radiculopathy Stable. Getting spinal injections and pain is better per patient.  4. Type 2 diabetes mellitus with complication, unspecified long term insulin use status ([place])  Patient was seen and examined by Dr. Eulas Post and note was scribed by Theressa Millard, RMA. 5.  H. Pylori Patient had normal EGD in January. Dr. Watt Climes will see patient next week. 6. Ventral hernia 7.s/p Gastric Bypass 8.Chronic Constipation Switch from Linzess to Trulance daily. I have done the exam and reviewed  the above chart and it is accurate to the best of my knowledge.  Miguel Aschoff MD [facility] 05/31/2016 1:42 PM

## 2016-06-01 LAB — CBC WITH DIFFERENTIAL/PLATELET
Basophils Absolute: 0 10*3/uL (ref 0.0–0.2)
Basos: 0 %
EOS (ABSOLUTE): 0.1 10*3/uL (ref 0.0–0.4)
Eos: 1 %
Hematocrit: 36.5 % (ref 34.0–46.6)
Hemoglobin: 12.1 g/dL (ref 11.1–15.9)
Immature Grans (Abs): 0 10*3/uL (ref 0.0–0.1)
Immature Granulocytes: 0 %
Lymphocytes Absolute: 3.6 10*3/uL — ABNORMAL HIGH (ref 0.7–3.1)
Lymphs: 34 %
MCH: 26.8 pg (ref 26.6–33.0)
MCHC: 33.2 g/dL (ref 31.5–35.7)
MCV: 81 fL (ref 79–97)
Monocytes Absolute: 0.8 10*3/uL (ref 0.1–0.9)
Monocytes: 7 %
Neutrophils Absolute: 6.1 10*3/uL (ref 1.4–7.0)
Neutrophils: 58 %
Platelets: 431 10*3/uL — ABNORMAL HIGH (ref 150–379)
RBC: 4.51 x10E6/uL (ref 3.77–5.28)
RDW: 14.7 % (ref 12.3–15.4)
WBC: 10.5 10*3/uL (ref 3.4–10.8)

## 2016-06-01 LAB — COMPREHENSIVE METABOLIC PANEL
ALT: 11 IU/L (ref 0–32)
AST: 18 IU/L (ref 0–40)
Albumin/Globulin Ratio: 1.4 (ref 1.2–2.2)
Albumin: 4.8 g/dL (ref 3.6–4.8)
Alkaline Phosphatase: 121 IU/L — ABNORMAL HIGH (ref 39–117)
BUN/Creatinine Ratio: 14 (ref 12–28)
BUN: 19 mg/dL (ref 8–27)
Bilirubin Total: <0.2 mg/dL (ref 0.0–1.2)
CO2: 29 mmol/L (ref 18–29)
Calcium: 10.8 mg/dL — ABNORMAL HIGH (ref 8.7–10.3)
Chloride: 102 mmol/L (ref 96–106)
Creatinine, Ser: 1.39 mg/dL — ABNORMAL HIGH (ref 0.57–1.00)
GFR calc Af Amer: 47 mL/min/{1.73_m2} — ABNORMAL LOW (ref >59)
GFR calc non Af Amer: 40 mL/min/{1.73_m2} — ABNORMAL LOW (ref >59)
Globulin, Total: 3.4 g/dL (ref 1.5–4.5)
Glucose: 93 mg/dL (ref 65–99)
Potassium: 4.8 mmol/L (ref 3.5–5.2)
Sodium: 148 mmol/L — ABNORMAL HIGH (ref 134–144)
Total Protein: 8.2 g/dL (ref 6.0–8.5)

## 2016-06-01 LAB — LIPASE: Lipase: 35 U/L (ref 0–59)

## 2016-06-07 ENCOUNTER — Ambulatory Visit (INDEPENDENT_AMBULATORY_CARE_PROVIDER_SITE_OTHER): Payer: Medicare PPO | Admitting: Family Medicine

## 2016-06-07 VITALS — BP 100/50 | HR 104 | Temp 98.3°F | Resp 16 | Wt 144.0 lb

## 2016-06-07 DIAGNOSIS — K5909 Other constipation: Secondary | ICD-10-CM

## 2016-06-07 DIAGNOSIS — K5900 Constipation, unspecified: Secondary | ICD-10-CM

## 2016-06-07 MED ORDER — POLYETHYLENE GLYCOL 3350 17 GM/SCOOP PO POWD
17.0000 g | Freq: Two times a day (BID) | ORAL | Status: DC | PRN
Start: 2016-06-07 — End: 2016-08-08

## 2016-06-07 MED ORDER — LINACLOTIDE 290 MCG PO CAPS: 290.0000 ug | ORAL_CAPSULE | Freq: Every day | ORAL | Status: DC

## 2016-06-07 NOTE — Progress Notes (Signed)
Julie Acevedo  MRN: [id number] DOB: [birth year]  Subjective:  HPI   The patient is a 65 year old female who presents for follow up of constipation.  Her last visit was 1 week ago and at that time she was instructed to stop her Linzess and go on Trulance.  The patient reports that she did not see any change in her bowel habits.  She still has infrequent, small, loose bowel movement.  She continues with abdominal pain.  She reports having only had 3 very small bowel movements in the week since she was seen.   The patient is currently avoiding foods that would constipate her such as dairy products.  She is not on any narcotic pain medication.  She does however take a Prenatal vitamin with iron.  She states she started taking them about a year ago after she was told her Potassium was low.  She states she has never been told she was anemic.    Patient Active Problem List   Diagnosis Date Noted  . SBO (small bowel obstruction) ([place])   . Partial small bowel obstruction ([place]) 04/23/2016  . Adjustment disorder with mixed anxiety and depressed mood 04/23/2016  . Memory difficulties 09/01/2015  . Arthritis 06/11/2015  . Back pain, chronic 06/11/2015  . HLD (hyperlipidemia) 06/11/2015  . Cannot sleep 06/11/2015  . L-S radiculopathy 06/11/2015  . Arthritis of knee, degenerative 06/11/2015  . B12 deficiency 06/11/2015  . 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 DM2 (diabetes mellitus, type 2) ([place]) 11/15/2012  . DM (diabetes mellitus) ([place]) 01/27/2012  . HTN (hypertension) 01/27/2012  . Adiposity 11/21/2009  . Avitaminosis D 11/07/2009  . Narrowing of intervertebral disc space 07/25/2009  . Diabetes ([place]) 07/25/2009  . Benign essential HTN 07/25/2009    Past Medical History  Diagnosis Date  . DM (diabetes mellitus) ([place])   . HTN (hypertension)   . Diverticulitis   . Arthritis   . Coronary artery disease   . Stroke ([place])   .  Short-term memory loss   . Myocardial infarction ([place])   . Memory difficulties 09/01/2015    Social History   Social History  . Marital Status: Married    Spouse Name: N/A  . Number of Children: 3  . 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 Smoking status: Never Smoker   . Smokeless tobacco: Never Used  . Alcohol Use: No  . Drug Use: No  . Sexual Activity: Not on file   Other Topics Concern  . Not on file   Social History Narrative   Lives with husband, daughter and granddaughter.;   Patient drinks about 3 cups of caffeine daily.   Patient is left handed.     Outpatient Prescriptions Prior to Visit  Medication Sig Dispense Refill  . amLODipine (NORVASC) 10 MG tablet Take 1 tablet (10 mg total) by mouth daily. 90 tablet 2  . diazepam (VALIUM) 5 MG tablet Take 2.5-5 mg by mouth every 6 (six) hours as needed for anxiety.    . gabapentin (NEURONTIN) 100 MG capsule Take 1 capsule (100 mg total) by mouth 3 (three) times daily. 90 capsule 3  . hydrochlorothiazide (HYDRODIURIL) 25 MG tablet     . Melatonin 10 MG TABS Take 10 mg by mouth at bedtime.    . metFORMIN (GLUCOPHAGE) 500 MG tablet Take 1 tablet (500 mg  total) by mouth 2 (two) times daily with a meal. 180 tablet 2  . Plecanatide (TRULANCE) 3 MG TABS Take 1 tablet by mouth daily. 7 tablet 0  . Prenatal Vit-Fe Fumarate-FA (PRENATAL MULTIVITAMIN) TABS tablet Take 1 tablet by mouth daily.    . promethazine (PHENERGAN) 12.5 MG tablet Take 1 tablet (12.5 mg total) by mouth every 8 (eight) hours as needed for nausea or vomiting. 20 tablet 0   No facility-administered medications prior to visit.    Allergies  Allergen Reactions  . Lotrel [Amlodipine Besy-Benazepril Hcl] Anaphylaxis  . Contrast Media [Iodinated Diagnostic Agents] Swelling and Other (See Comments)    Reaction:  Neck swelling   . Niacin Hives  . Sulfa Antibiotics Hives    Review of Systems  Constitutional:  Positive for malaise/fatigue. Negative for fever.  Respiratory: Negative for cough, shortness of breath and wheezing.   Cardiovascular: Negative for chest pain, palpitations, orthopnea, claudication, leg swelling and PND.  Gastrointestinal: Positive for nausea, abdominal pain, diarrhea and constipation. Negative for heartburn, vomiting, blood in stool and melena.  Neurological: Negative for dizziness, weakness and headaches.   Objective:  BP 100/50 mmHg  Pulse 104  Temp(Src) 98.3 F (36.8 C) (Oral)  Resp 16  Wt 144 lb (65.318 kg)  Physical Exam  Constitutional: She is oriented to person, place, and time and well-developed, well-nourished, and in no distress.  HENT:  Head: Normocephalic and atraumatic.  Right Ear: External ear normal.  Left Ear: External ear normal.  Nose: Nose normal.  Eyes: Conjunctivae are normal. Pupils are equal, round, and reactive to light.  Cardiovascular: Normal rate, regular rhythm and normal heart sounds.   Pulmonary/Chest: Effort normal and breath sounds normal.  Abdominal: Soft. Bowel sounds are normal. She exhibits no distension and no mass. There is tenderness (Tender throughout, worse mid epigastric). There is no rebound and no guarding.  Neurological: She is alert and oriented to person, place, and time. Gait normal.  Skin: Skin is warm and dry.  Psychiatric: Mood and affect normal.    Assessment and Plan :   1. Chronic constipation  - linaclotide (LINZESS) 290 MCG CAPS capsule; Take 1 capsule (290 mcg total) by mouth daily before breakfast.  Dispense: 30 capsule; Refill: 12 - polyethylene glycol powder (GLYCOLAX/MIRALAX) powder; Take 17 g by mouth 2 (two) times daily as needed.  Dispense: 3350 g; Refill: 1  Patient is to stop her Prenatal vitamin and the Trulance.  She is to go back on the Linzess at a higher dose and use Miralax daily.    Keep appointment with the GI doctor.  If patient needs referral to surgery after seeing GI, she is to  call us back.   Follow up in 2-4 weeks after seen by GI.  If referred to Dr. Bary Castilla may cancel appointment with [place].  2. Abdominal pain Uncertain etiology. Patient has had gastric bypass before. She does have ventral hernia and had a partial small bowel obstruction recently. True etiology of pain remains unclear at this time. She does not have a surgical abdomen. I have done the exam and reviewed the above chart and it is accurate to the best of my knowledge.  Miguel Aschoff MD [facility] 06/07/2016 11:08 AM

## 2016-06-07 NOTE — Patient Instructions (Addendum)
Patient is to stop her Prenatal vitamin and the Trulance.  She is to go back on the Linzess at a higher dose and use Miralax daily.    Keep appointment with the GI doctor.  If patient needs referral to surgery after seeing GI, she is to call us back.   Follow up in 2-4 weeks after seen by GI.  If referred to Dr. Bary Castilla may cancel appointment with [place].

## 2016-06-11 IMAGING — MG MM DIGITAL SCREENING BILAT W/ CAD
1 series · 4 of 4 positions shown · non-contrast
Comparison: Previous exam(s).

CLINICAL DATA: Screening.

EXAM:
DIGITAL SCREENING BILATERAL MAMMOGRAM WITH CAD

[R CC · right · 4 of 4 slices shown]
[im 1/4]
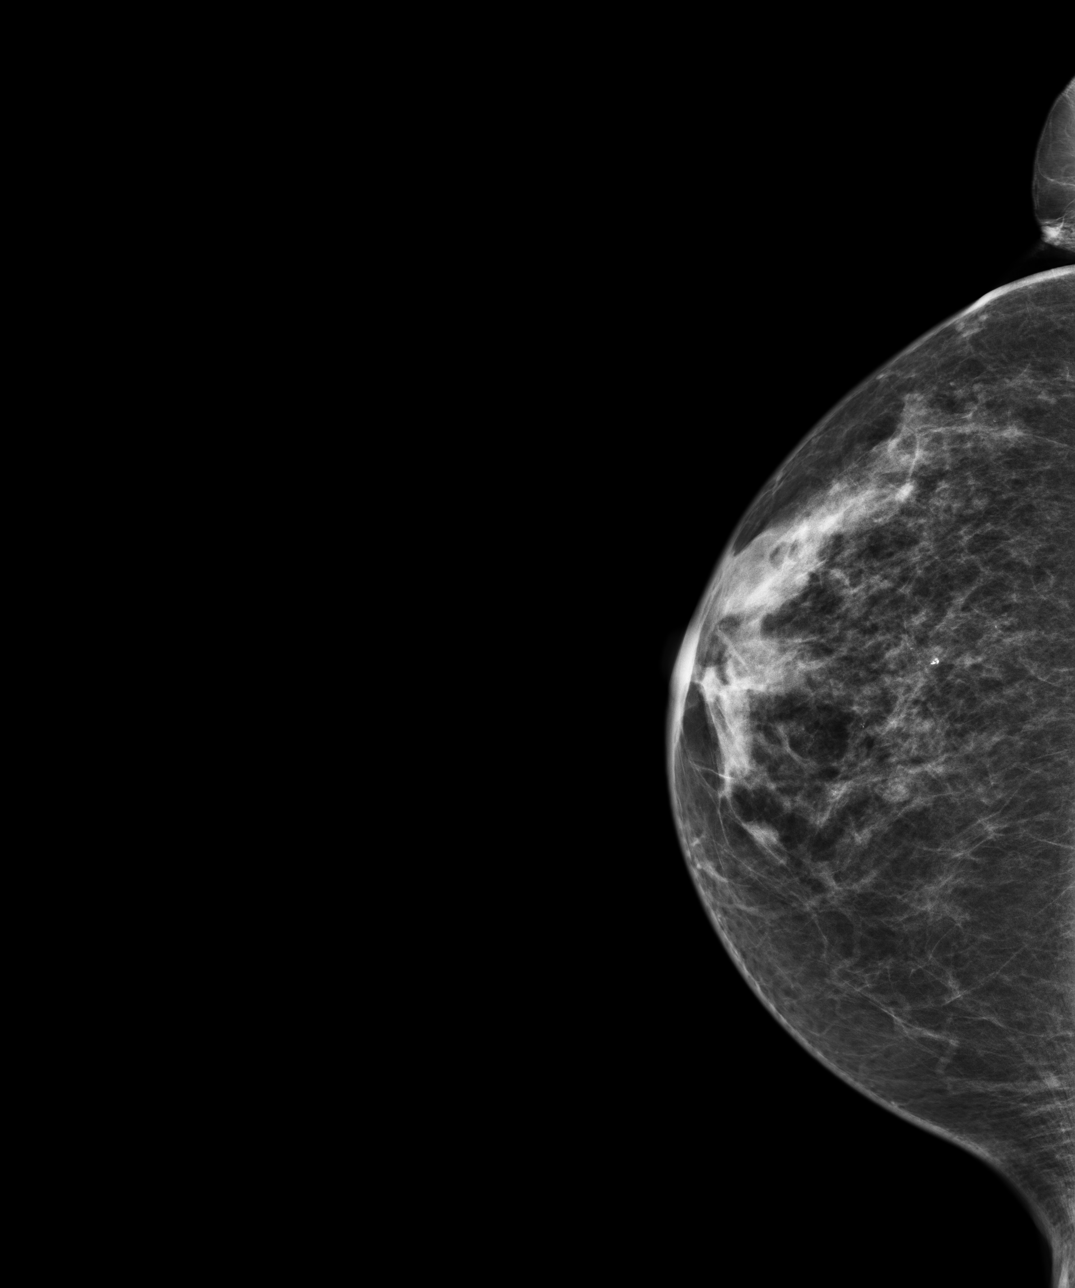
[im 2/4]
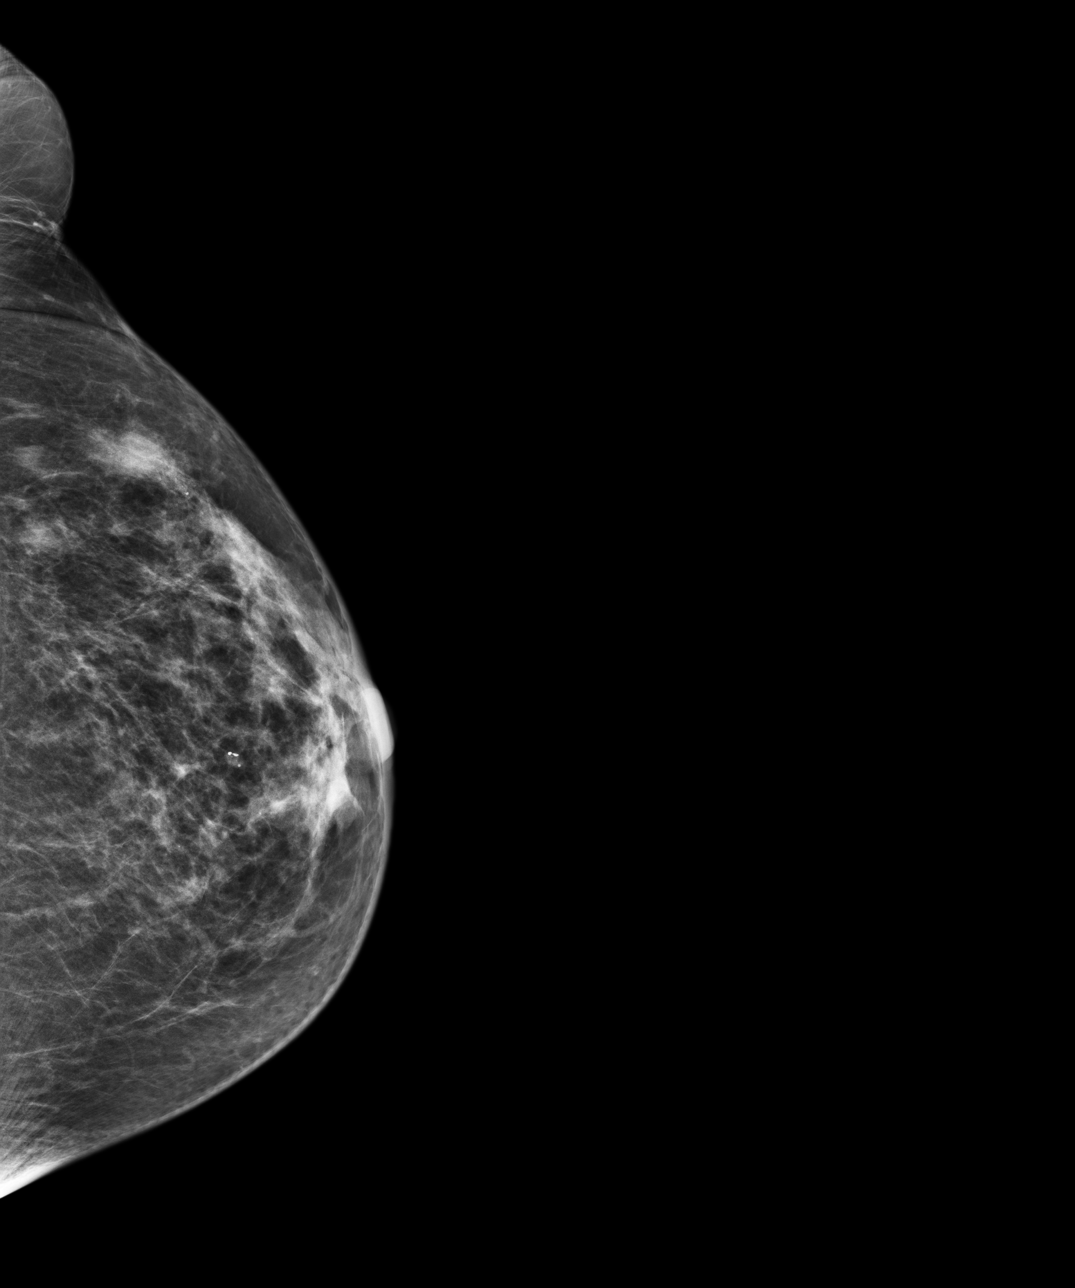
[im 3/4]
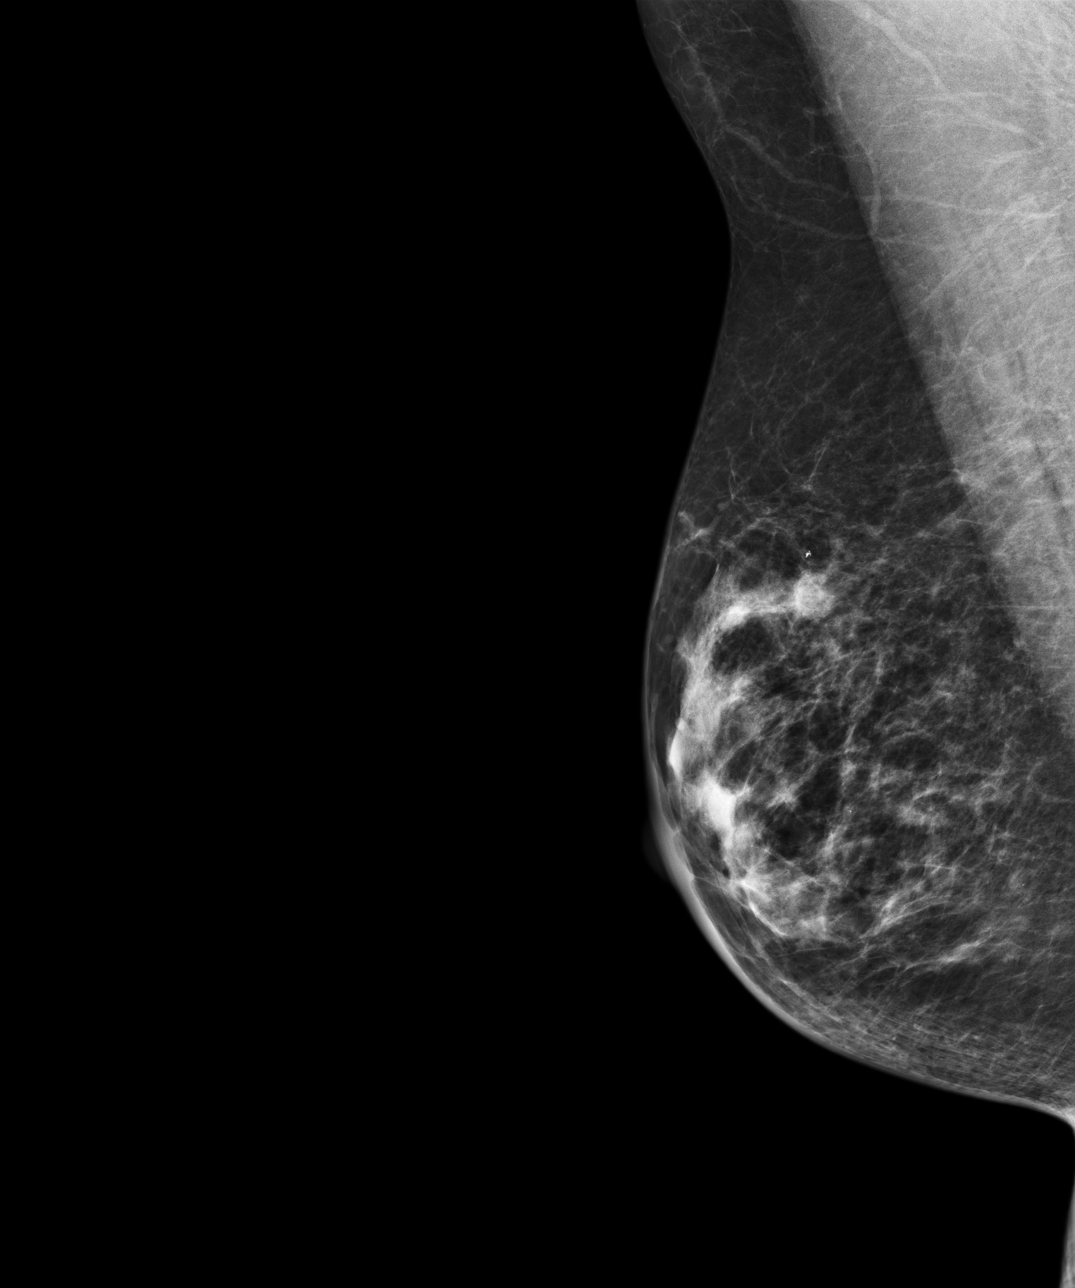
[im 4/4]
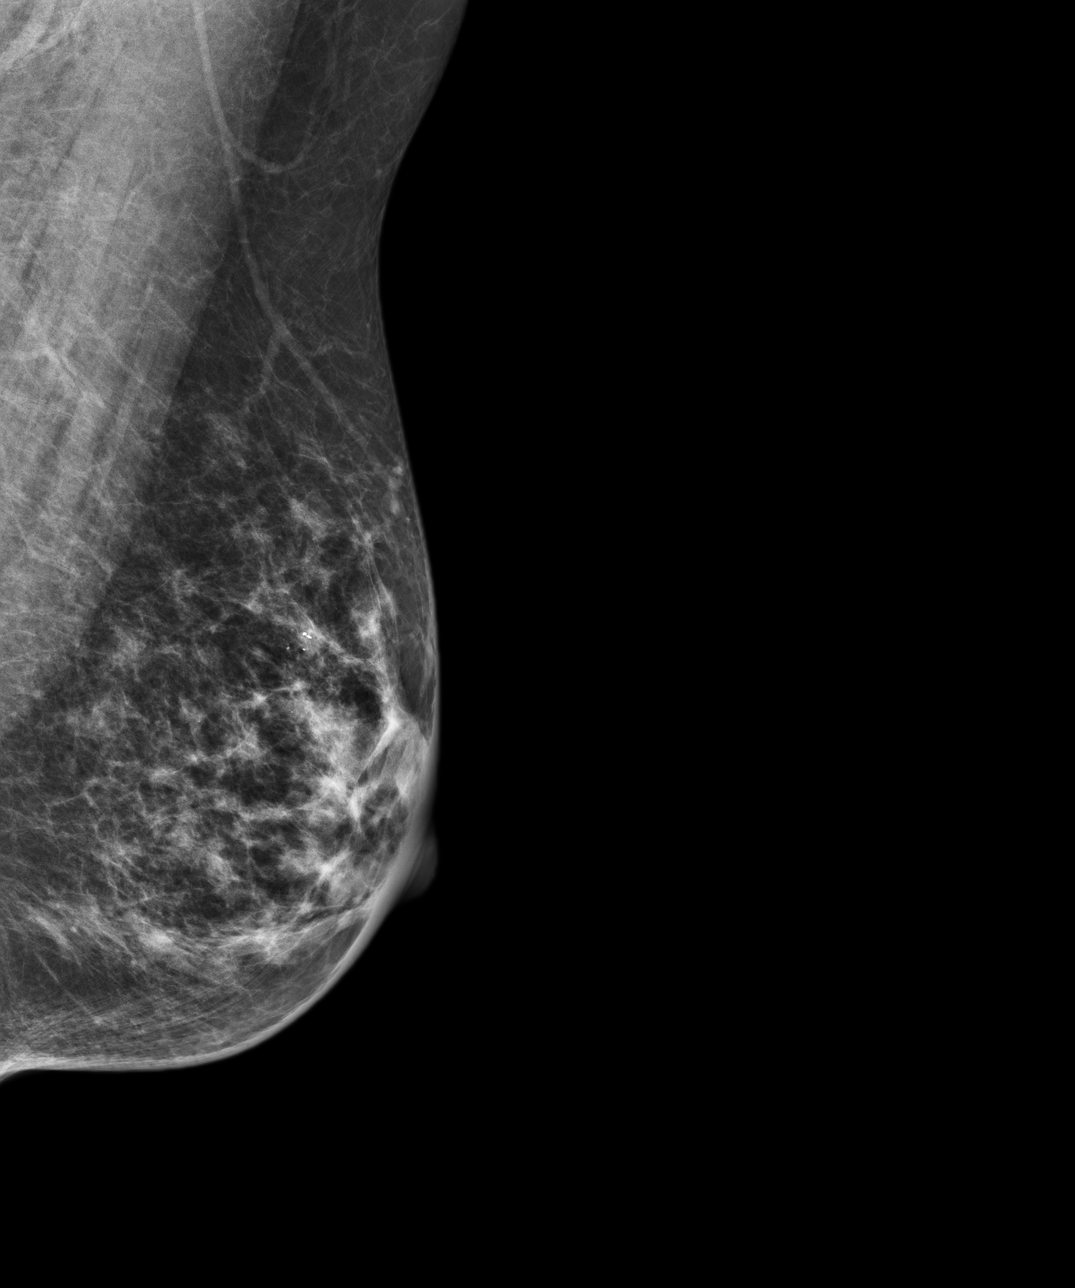

[4 of 4 positions shown; findings below may reference images not displayed]

ACR Breast Density Category b: There are scattered areas of
fibroglandular density.
FINDINGS: There are no findings suspicious for malignancy. Images were
processed with CAD.
IMPRESSION: No mammographic evidence of malignancy. A result letter of this
screening mammogram will be mailed directly to the patient.

RECOMMENDATION:
Screening mammogram in one year. (Code:AS-G-LCT)

BI-RADS CATEGORY  1: Negative.

## 2016-06-18 ENCOUNTER — Ambulatory Visit (INDEPENDENT_AMBULATORY_CARE_PROVIDER_SITE_OTHER): Payer: Medicare PPO | Admitting: Family Medicine

## 2016-06-18 ENCOUNTER — Encounter: Payer: Self-pay | Admitting: Family Medicine

## 2016-06-18 VITALS — BP 102/58 | HR 80 | Temp 97.9°F | Resp 16 | Wt 150.0 lb

## 2016-06-18 DIAGNOSIS — N63 Unspecified lump in unspecified breast: Secondary | ICD-10-CM

## 2016-06-18 DIAGNOSIS — N644 Mastodynia: Secondary | ICD-10-CM

## 2016-06-18 NOTE — Progress Notes (Signed)
Patient ID: Julie Acevedo, female   DOB: 1951-10-30, 65 y.o.   MRN: [id number]    Subjective:  HPI Pt is here today for a breast lump. She noticed it yesterday. It is located on the left breast on the side closest to her axilla. She reports that it feels hard and is about the size of a marble. Her last mammogram was 06/07/14 and was normal.   Prior to Admission medications   Medication Sig Start Date End Date Taking? Authorizing Provider  amLODipine (NORVASC) 10 MG tablet Take 1 tablet (10 mg total) by mouth daily. 11/03/15  Yes Loveah Like Maceo Pro., MD  diazepam (VALIUM) 5 MG tablet Take 2.5-5 mg by mouth every 6 (six) hours as needed for anxiety.   Yes Historical Provider, MD  gabapentin (NEURONTIN) 100 MG capsule Take 1 capsule (100 mg total) by mouth 3 (three) times daily. 02/21/16  Yes Renesha Lizama Maceo Pro., MD  hydrochlorothiazide (HYDRODIURIL) 25 MG tablet  05/30/16  Yes Historical Provider, MD  linaclotide Rolan Lipa) 290 MCG CAPS capsule Take 1 capsule (290 mcg total) by mouth daily before breakfast. 06/07/16  Yes Jerrol Banana., MD  Melatonin 10 MG TABS Take 10 mg by mouth at bedtime.   Yes Historical Provider, MD  metFORMIN (GLUCOPHAGE) 500 MG tablet Take 1 tablet (500 mg total) by mouth 2 (two) times daily with a meal. 11/03/15  Yes Jerrol Banana., MD  polyethylene glycol powder (GLYCOLAX/MIRALAX) powder Take 17 g by mouth 2 (two) times daily as needed. 06/07/16  Yes Hortensia Duffin Maceo Pro., MD  promethazine (PHENERGAN) 12.5 MG tablet Take 1 tablet (12.5 mg total) by mouth every 8 (eight) hours as needed for nausea or vomiting. 02/23/16  Yes Margo Common, PA    Patient Active Problem List   Diagnosis Date Noted  . SBO (small bowel obstruction) ([place])   . Partial small bowel obstruction ([place]) 04/23/2016  . Adjustment disorder with mixed anxiety and depressed mood 04/23/2016  . Memory difficulties 09/01/2015  . Arthritis 06/11/2015  . Back pain, chronic 06/11/2015  . HLD  (hyperlipidemia) 06/11/2015  . Cannot sleep 06/11/2015  . L-S radiculopathy 06/11/2015  . Arthritis of knee, degenerative 06/11/2015  . B12 deficiency 06/11/2015  . 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 DM2 (diabetes mellitus, type 2) ([place]) 11/15/2012  . DM (diabetes mellitus) ([place]) 01/27/2012  . HTN (hypertension) 01/27/2012  . Adiposity 11/21/2009  . Avitaminosis D 11/07/2009  . Narrowing of intervertebral disc space 07/25/2009  . Diabetes ([place]) 07/25/2009  . Benign essential HTN 07/25/2009    Past Medical History  Diagnosis Date  . DM (diabetes mellitus) ([place])   . HTN (hypertension)   . Diverticulitis   . Arthritis   . Coronary artery disease   . Stroke ([employer])   . Short-term memory loss   . Myocardial infarction ([place])   . Memory difficulties 09/01/2015    Social History   Social History  . Marital Status: Married    Spouse Name: N/A  . Number of Children: 3  . 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 Smoking status: Never Smoker   . Smokeless tobacco: Never Used  . Alcohol Use: No  . Drug Use: No  . Sexual Activity: Not on file   Other Topics Concern  . Not on file   Social History Narrative  Lives with husband, daughter and granddaughter.;   Patient drinks about 3 cups of caffeine daily.   Patient is left handed.     Allergies  Allergen Reactions  . Lotrel [Amlodipine Besy-Benazepril Hcl] Anaphylaxis  . Contrast Media [Iodinated Diagnostic Agents] Swelling and Other (See Comments)    Reaction:  Neck swelling   . Niacin Hives  . Sulfa Antibiotics Hives    Review of Systems  Constitutional: Negative.   HENT: Negative.   Eyes: Negative.   Respiratory: Negative.   Cardiovascular: Negative.        Left Breast lump   Gastrointestinal: Negative.   Genitourinary: Negative.   Musculoskeletal: Negative.   Skin: Negative.     Neurological: Negative.   Endo/Heme/Allergies: Negative.   Psychiatric/Behavioral: Negative.     Immunization History  Administered Date(s) Administered  . Influenza-Unspecified 09/15/2015  . Pneumococcal Polysaccharide-23 11/17/2012  . Tdap 10/02/2011   Objective:  BP 102/58 mmHg  Pulse 80  Temp(Src) 97.9 F (36.6 C) (Oral)  Resp 16  Wt 150 lb (68.04 kg)  Physical Exam  Constitutional: She is oriented to person, place, and time and well-developed, well-nourished, and in no distress.  HENT:  Head: Normocephalic and atraumatic.  Cardiovascular: Normal rate.   Pulmonary/Chest:  Tenderness and is in the left breast at the 3:00 position. There is no mass but the tenderness is over the ribs in this area. No adenopathy.  Neurological: She is alert and oriented to person, place, and time. She has normal reflexes. Gait normal. GCS score is 15.  Skin: Skin is warm and dry.  Psychiatric: Mood, memory, affect and judgment normal.    Lab Results  Component Value Date   WBC 10.5 05/31/2016   HGB 10.0* 04/24/2016   HCT 36.5 05/31/2016   PLT 431* 05/31/2016   GLUCOSE 93 05/31/2016   CHOL 154 01/18/2014   TRIG 53 01/18/2014   HDL 74* 01/18/2014   LDLCALC 69 01/18/2014   TSH 2.68 01/18/2014   INR 1.02 10/04/2014   HGBA1C 5.9 04/23/2016    CMP     Component Value Date/Time   NA 148* 05/31/2016 1430   NA 144 04/24/2016 0526   NA 140 02/17/2014 1843   K 4.8 05/31/2016 1430   K 2.9* 02/17/2014 1843   CL 102 05/31/2016 1430   CL 106 02/17/2014 1843   CO2 29 05/31/2016 1430   CO2 29 02/17/2014 1843   GLUCOSE 93 05/31/2016 1430   GLUCOSE 88 04/24/2016 0526   GLUCOSE 95 02/17/2014 1843   BUN 19 05/31/2016 1430   BUN 7 04/24/2016 0526   BUN 13 02/17/2014 1843   CREATININE 1.39* 05/31/2016 1430   CREATININE 0.87 02/17/2014 1843   CREATININE 0.9 01/21/2014   CALCIUM 10.8* 05/31/2016 1430   CALCIUM 9.4 02/17/2014 1843   PROT 8.2 05/31/2016 1430   PROT 7.9 04/23/2016 1240    PROT 7.5 01/17/2014 1945   ALBUMIN 4.8 05/31/2016 1430   ALBUMIN 4.2 04/23/2016 1240   ALBUMIN 3.5 01/17/2014 1945   AST 18 05/31/2016 1430   AST 20 01/17/2014 1945   ALT 11 05/31/2016 1430   ALT 21 01/17/2014 1945   ALKPHOS 121* 05/31/2016 1430   ALKPHOS 116 01/17/2014 1945   BILITOT <0.2 05/31/2016 1430   BILITOT 0.6 04/23/2016 1240   BILITOT 0.1* 01/17/2014 1945   GFRNONAA 40* 05/31/2016 1430   GFRNONAA >60 02/17/2014 1843   GFRAA 47* 05/31/2016 1430   GFRAA >60 02/17/2014 1843    Assessment and Plan :  1. Breast tenderness  - MM Digital Diagnostic Bilat; Future  2. Breast lump  - MM Digital Diagnostic Bilat; Future  Patient was seen and examined by Dr. Miguel Aschoff, and noted scribed by Webb Laws, [place] MD [facility] 06/18/2016 3:04 PM

## 2016-06-22 ENCOUNTER — Other Ambulatory Visit: Payer: Self-pay | Admitting: Emergency Medicine

## 2016-06-22 DIAGNOSIS — N644 Mastodynia: Secondary | ICD-10-CM

## 2016-06-22 DIAGNOSIS — N63 Unspecified lump in unspecified breast: Secondary | ICD-10-CM

## 2016-06-26 ENCOUNTER — Telehealth: Payer: Self-pay | Admitting: Family Medicine

## 2016-06-26 DIAGNOSIS — N644 Mastodynia: Secondary | ICD-10-CM

## 2016-06-26 NOTE — Telephone Encounter (Signed)
Julie Acevedo, since Dr Rosanna Randy is not here i placed this order but under your name so you could sign please. Thank you-aa

## 2016-06-26 NOTE — Telephone Encounter (Signed)
[place] states that an order is needed for left/right breast ultrasound,Thanks

## 2016-06-26 NOTE — Telephone Encounter (Signed)
Done

## 2016-06-28 ENCOUNTER — Ambulatory Visit (INDEPENDENT_AMBULATORY_CARE_PROVIDER_SITE_OTHER): Payer: Medicare PPO | Admitting: Family Medicine

## 2016-06-28 VITALS — BP 110/60 | HR 84 | Temp 98.1°F | Resp 16 | Wt 144.0 lb

## 2016-06-28 DIAGNOSIS — R109 Unspecified abdominal pain: Secondary | ICD-10-CM | POA: Diagnosis not present

## 2016-06-28 DIAGNOSIS — Z9884 Bariatric surgery status: Secondary | ICD-10-CM

## 2016-06-28 DIAGNOSIS — D649 Anemia, unspecified: Secondary | ICD-10-CM

## 2016-06-28 DIAGNOSIS — K5909 Other constipation: Secondary | ICD-10-CM

## 2016-06-28 DIAGNOSIS — K5900 Constipation, unspecified: Secondary | ICD-10-CM | POA: Diagnosis not present

## 2016-06-28 NOTE — Progress Notes (Signed)
Patient ID: Julie Acevedo, female   DOB: 04-Mar-1951, 65 y.o.   MRN: [id number]    Subjective:  HPI Constipation- Pt is here for a 3 week follow up of constipation and abdominal pain. She reports that her constipation is a little better she is having a BM about every other day. She is still having abdominal pain.  Pt reports that she is only eating light foods such as fruits and vegetables and soup. She saw GI in [place] and he told her that she may have to have surgery to remove the scar tissue or have her gastric bypass removed. Pt does not want to see this doctor anymore, she would like someone else to do the surgery if that is what she needs.    Prior to Admission medications   Medication Sig Start Date End Date Taking? Authorizing Provider  amLODipine (NORVASC) 10 MG tablet Take 1 tablet (10 mg total) by mouth daily. 11/03/15  Yes Myrick Mcnairy Maceo Pro., MD  diazepam (VALIUM) 5 MG tablet Take 2.5-5 mg by mouth every 6 (six) hours as needed for anxiety.   Yes Historical Provider, MD  gabapentin (NEURONTIN) 100 MG capsule Take 1 capsule (100 mg total) by mouth 3 (three) times daily. 02/21/16  Yes Adalyn Pennock Maceo Pro., MD  hydrochlorothiazide (HYDRODIURIL) 25 MG tablet  05/30/16  Yes Historical Provider, MD  linaclotide Rolan Lipa) 290 MCG CAPS capsule Take 1 capsule (290 mcg total) by mouth daily before breakfast. 06/07/16  Yes Jerrol Banana., MD  Melatonin 10 MG TABS Take 10 mg by mouth at bedtime.   Yes Historical Provider, MD  metFORMIN (GLUCOPHAGE) 500 MG tablet Take 1 tablet (500 mg total) by mouth 2 (two) times daily with a meal. 11/03/15  Yes Jerrol Banana., MD  polyethylene glycol powder (GLYCOLAX/MIRALAX) powder Take 17 g by mouth 2 (two) times daily as needed. 06/07/16  Yes Fantasia Jinkins Maceo Pro., MD  promethazine (PHENERGAN) 12.5 MG tablet Take 1 tablet (12.5 mg total) by mouth every 8 (eight) hours as needed for nausea or vomiting. 02/23/16  Yes Margo Common, PA     Patient Active Problem List   Diagnosis Date Noted  . SBO (small bowel obstruction) ([place])   . Partial small bowel obstruction ([place]) 04/23/2016  . Adjustment disorder with mixed anxiety and depressed mood 04/23/2016  . Memory difficulties 09/01/2015  . Arthritis 06/11/2015  . Back pain, chronic 06/11/2015  . HLD (hyperlipidemia) 06/11/2015  . Cannot sleep 06/11/2015  . L-S radiculopathy 06/11/2015  . Arthritis of knee, degenerative 06/11/2015  . B12 deficiency 06/11/2015  . 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 DM2 (diabetes mellitus, type 2) ([place]) 11/15/2012  . DM (diabetes mellitus) ([place]) 01/27/2012  . HTN (hypertension) 01/27/2012  . Adiposity 11/21/2009  . Avitaminosis D 11/07/2009  . Narrowing of intervertebral disc space 07/25/2009  . Diabetes ([place]) 07/25/2009  . Benign essential HTN 07/25/2009    Past Medical History  Diagnosis Date  . DM (diabetes mellitus) ([place])   . HTN (hypertension)   . Diverticulitis   . Arthritis   . Coronary artery disease   . Stroke ([place])   . Short-term memory loss   . Myocardial infarction ([place])   . Memory difficulties 09/01/2015    Social History   Social History  . Marital Status: Married    Spouse Name: N/A  . Number of Children: 3  . Years of Education: 8  Occupational History  . Lab [employer]    Social History Main Topics  . Smoking status: Never Smoker   . Smokeless tobacco: Never Used  . Alcohol Use: No  . Drug Use: No  . Sexual Activity: Not on file   Other Topics Concern  . Not on file   Social History Narrative   Lives with husband, daughter and granddaughter.;   Patient drinks about 3 cups of caffeine daily.   Patient is left handed.     Allergies  Allergen Reactions  . Lotrel [Amlodipine Besy-Benazepril Hcl] Anaphylaxis  . Contrast Media [Iodinated Diagnostic Agents] Swelling and Other (See Comments)    Reaction:  Neck swelling   . Niacin  Hives  . Sulfa Antibiotics Hives    Review of Systems  Constitutional: Negative.   HENT: Negative.   Eyes: Negative.   Respiratory: Negative.   Cardiovascular: Negative.   Gastrointestinal: Positive for abdominal pain and constipation.  Genitourinary: Negative.   Musculoskeletal: Negative.   Skin: Negative.   Neurological: Negative.   Endo/Heme/Allergies: Negative.   Psychiatric/Behavioral: Negative.     Immunization History  Administered Date(s) Administered  . Influenza-Unspecified 09/15/2015  . Pneumococcal Polysaccharide-23 11/17/2012  . Tdap 10/02/2011   Objective:  BP 110/60 mmHg  Pulse 84  Temp(Src) 98.1 F (36.7 C) (Oral)  Resp 16  Wt 144 lb (65.318 kg)  Physical Exam  Constitutional: She is oriented to person, place, and time and well-developed, well-nourished, and in no distress.  HENT:  Head: Normocephalic and atraumatic.  Eyes: Conjunctivae and EOM are normal. Pupils are equal, round, and reactive to light.  Neck: Normal range of motion. Neck supple.  Cardiovascular: Normal rate, regular rhythm, normal heart sounds and intact distal pulses.   Pulmonary/Chest: Effort normal and breath sounds normal.  Abdominal: There is tenderness (mild diffuse tenderness without gaurding and rebound.).  Musculoskeletal: Normal range of motion.  Neurological: She is alert and oriented to person, place, and time. She has normal reflexes. Gait normal. GCS score is 15.  Skin: Skin is warm and dry.  Psychiatric: Mood, memory, affect and judgment normal.    Lab Results  Component Value Date   WBC 10.5 05/31/2016   HGB 10.0* 04/24/2016   HCT 36.5 05/31/2016   PLT 431* 05/31/2016   GLUCOSE 93 05/31/2016   CHOL 154 01/18/2014   TRIG 53 01/18/2014   HDL 74* 01/18/2014   LDLCALC 69 01/18/2014   TSH 2.68 01/18/2014   INR 1.02 10/04/2014   HGBA1C 5.9 04/23/2016    CMP     Component Value Date/Time   NA 148* 05/31/2016 1430   NA 144 04/24/2016 0526   NA 140  02/17/2014 1843   K 4.8 05/31/2016 1430   K 2.9* 02/17/2014 1843   CL 102 05/31/2016 1430   CL 106 02/17/2014 1843   CO2 29 05/31/2016 1430   CO2 29 02/17/2014 1843   GLUCOSE 93 05/31/2016 1430   GLUCOSE 88 04/24/2016 0526   GLUCOSE 95 02/17/2014 1843   BUN 19 05/31/2016 1430   BUN 7 04/24/2016 0526   BUN 13 02/17/2014 1843   CREATININE 1.39* 05/31/2016 1430   CREATININE 0.87 02/17/2014 1843   CREATININE 0.9 01/21/2014   CALCIUM 10.8* 05/31/2016 1430   CALCIUM 9.4 02/17/2014 1843   PROT 8.2 05/31/2016 1430   PROT 7.9 04/23/2016 1240   PROT 7.5 01/17/2014 1945   ALBUMIN 4.8 05/31/2016 1430   ALBUMIN 4.2 04/23/2016 1240   ALBUMIN 3.5 01/17/2014 1945   AST 18  05/31/2016 1430   AST 20 01/17/2014 1945   ALT 11 05/31/2016 1430   ALT 21 01/17/2014 1945   ALKPHOS 121* 05/31/2016 1430   ALKPHOS 116 01/17/2014 1945   BILITOT <0.2 05/31/2016 1430   BILITOT 0.6 04/23/2016 1240   BILITOT 0.1* 01/17/2014 1945   GFRNONAA 40* 05/31/2016 1430   GFRNONAA >60 02/17/2014 1843   GFRAA 47* 05/31/2016 1430   GFRAA >60 02/17/2014 1843    Assessment and Plan :  1. Chronic constipation   2. Abdominal pain, unspecified abdominal location I think the pain is related to chronic constipation and also adhesions from previous surgery. I explained to the patient that I think she needs to discuss with surgeon her options to try to help this. - Comprehensive metabolic panel  3. Hypercalcemia  - Calcium, ionized - PTH, intact and calcium  4. Gastric bypass status for obesity   5. Anemia, unspecified anemia type  - CBC with Differential/Platelet.   Patient was seen and examined by Dr. Miguel Aschoff, and noted scribed by Webb Laws, [place] MD [facility] 06/28/2016 2:32 PM

## 2016-06-29 LAB — CBC WITH DIFFERENTIAL/PLATELET
Basophils Absolute: 0 10*3/uL (ref 0.0–0.2)
Basos: 0 %
EOS (ABSOLUTE): 0.1 10*3/uL (ref 0.0–0.4)
Eos: 1 %
Hematocrit: 32.8 % — ABNORMAL LOW (ref 34.0–46.6)
Hemoglobin: 11 g/dL — ABNORMAL LOW (ref 11.1–15.9)
Immature Grans (Abs): 0 10*3/uL (ref 0.0–0.1)
Immature Granulocytes: 0 %
Lymphocytes Absolute: 2.7 10*3/uL (ref 0.7–3.1)
Lymphs: 34 %
MCH: 26.4 pg — ABNORMAL LOW (ref 26.6–33.0)
MCHC: 33.5 g/dL (ref 31.5–35.7)
MCV: 79 fL (ref 79–97)
Monocytes Absolute: 0.6 10*3/uL (ref 0.1–0.9)
Monocytes: 7 %
Neutrophils Absolute: 4.7 10*3/uL (ref 1.4–7.0)
Neutrophils: 58 %
Platelets: 387 10*3/uL — ABNORMAL HIGH (ref 150–379)
RBC: 4.16 x10E6/uL (ref 3.77–5.28)
RDW: 15.8 % — ABNORMAL HIGH (ref 12.3–15.4)
WBC: 8.1 10*3/uL (ref 3.4–10.8)

## 2016-06-29 LAB — COMPREHENSIVE METABOLIC PANEL
ALT: 12 IU/L (ref 0–32)
AST: 18 IU/L (ref 0–40)
Albumin/Globulin Ratio: 1.3 (ref 1.2–2.2)
Albumin: 4.2 g/dL (ref 3.6–4.8)
Alkaline Phosphatase: 120 IU/L — ABNORMAL HIGH (ref 39–117)
BUN/Creatinine Ratio: 14 (ref 12–28)
BUN: 14 mg/dL (ref 8–27)
Bilirubin Total: <0.2 mg/dL (ref 0.0–1.2)
CO2: 28 mmol/L (ref 18–29)
Calcium: 10.1 mg/dL (ref 8.7–10.3)
Chloride: 98 mmol/L (ref 96–106)
Creatinine, Ser: 0.99 mg/dL (ref 0.57–1.00)
GFR calc Af Amer: 70 mL/min/{1.73_m2} (ref >59)
GFR calc non Af Amer: 61 mL/min/{1.73_m2} (ref >59)
Globulin, Total: 3.3 g/dL (ref 1.5–4.5)
Glucose: 83 mg/dL (ref 65–99)
Potassium: 4 mmol/L (ref 3.5–5.2)
Sodium: 144 mmol/L (ref 134–144)
Total Protein: 7.5 g/dL (ref 6.0–8.5)

## 2016-06-29 LAB — PTH, INTACT AND CALCIUM: PTH: 99 pg/mL — ABNORMAL HIGH (ref 15–65)

## 2016-06-29 LAB — CALCIUM, IONIZED: Calcium, Ion: 5.4 mg/dL (ref 4.5–5.6)

## 2016-07-06 ENCOUNTER — Other Ambulatory Visit: Payer: Medicare PPO

## 2016-07-06 ENCOUNTER — Ambulatory Visit: Admission: RE | Admit: 2016-07-06 | Payer: Medicare PPO | Source: Ambulatory Visit

## 2016-07-20 ENCOUNTER — Other Ambulatory Visit: Payer: Self-pay | Admitting: Family Medicine

## 2016-07-23 ENCOUNTER — Other Ambulatory Visit: Payer: Self-pay | Admitting: Family Medicine

## 2016-07-25 NOTE — Telephone Encounter (Signed)
error 

## 2016-07-27 ENCOUNTER — Other Ambulatory Visit: Payer: Self-pay | Admitting: Family Medicine

## 2016-07-27 ENCOUNTER — Other Ambulatory Visit: Payer: Self-pay | Admitting: General Surgery

## 2016-07-27 ENCOUNTER — Ambulatory Visit
Admission: RE | Admit: 2016-07-27 | Discharge: 2016-07-27 | Disposition: A | Discharge status: Home / Self Care | Payer: Medicare PPO | Source: Ambulatory Visit | Attending: Physician Assistant | Admitting: Physician Assistant

## 2016-07-27 ENCOUNTER — Ambulatory Visit
Admission: RE | Admit: 2016-07-27 | Discharge: 2016-07-27 | Disposition: A | Discharge status: Home / Self Care | Payer: Medicare PPO | Source: Ambulatory Visit | Attending: Family Medicine | Admitting: Family Medicine

## 2016-07-27 DIAGNOSIS — R921 Mammographic calcification found on diagnostic imaging of breast: Secondary | ICD-10-CM | POA: Insufficient documentation

## 2016-07-27 DIAGNOSIS — N63 Unspecified lump in unspecified breast: Secondary | ICD-10-CM

## 2016-07-27 DIAGNOSIS — N644 Mastodynia: Secondary | ICD-10-CM

## 2016-08-12 IMAGING — CR RIGHT FOOT COMPLETE - 3+ VIEW
1 series · 3 of 3 positions shown · non-contrast
Comparison: None.

CLINICAL DATA: Right foot pain with medial palpable knot. Remote
surgery.

EXAM:
RIGHT FOOT COMPLETE - 3+ VIEW

[Series 1: ap · 0.17mm/px · 3 of 3 slices shown]
[im 1/3]
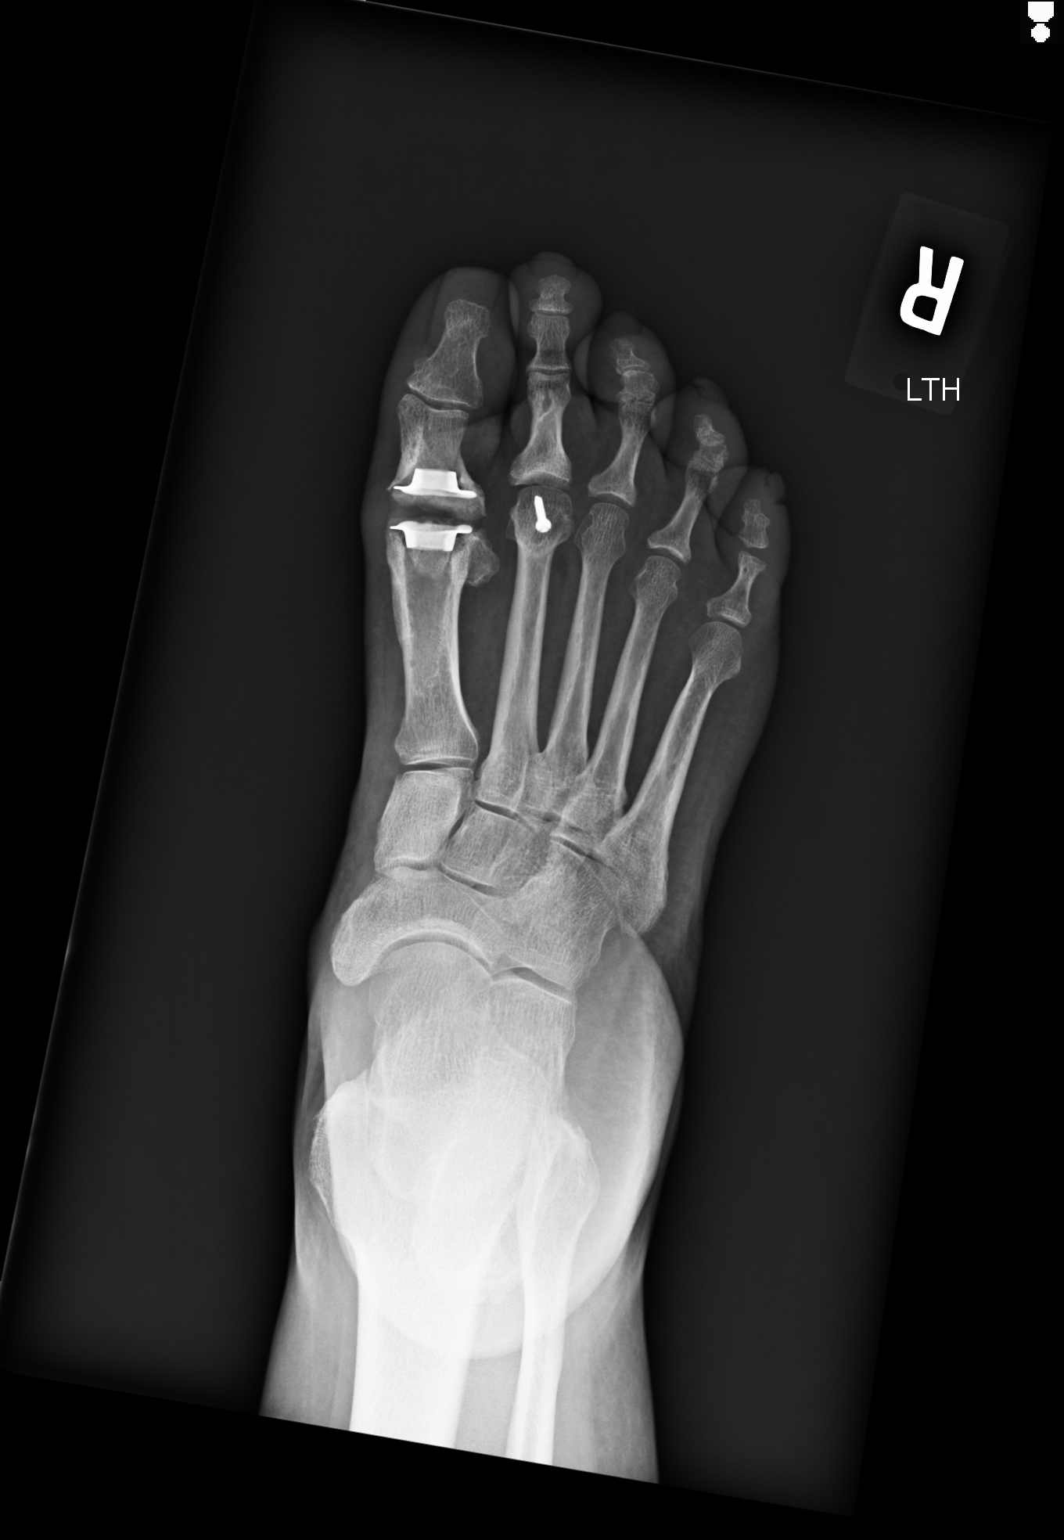
[im 2/3]
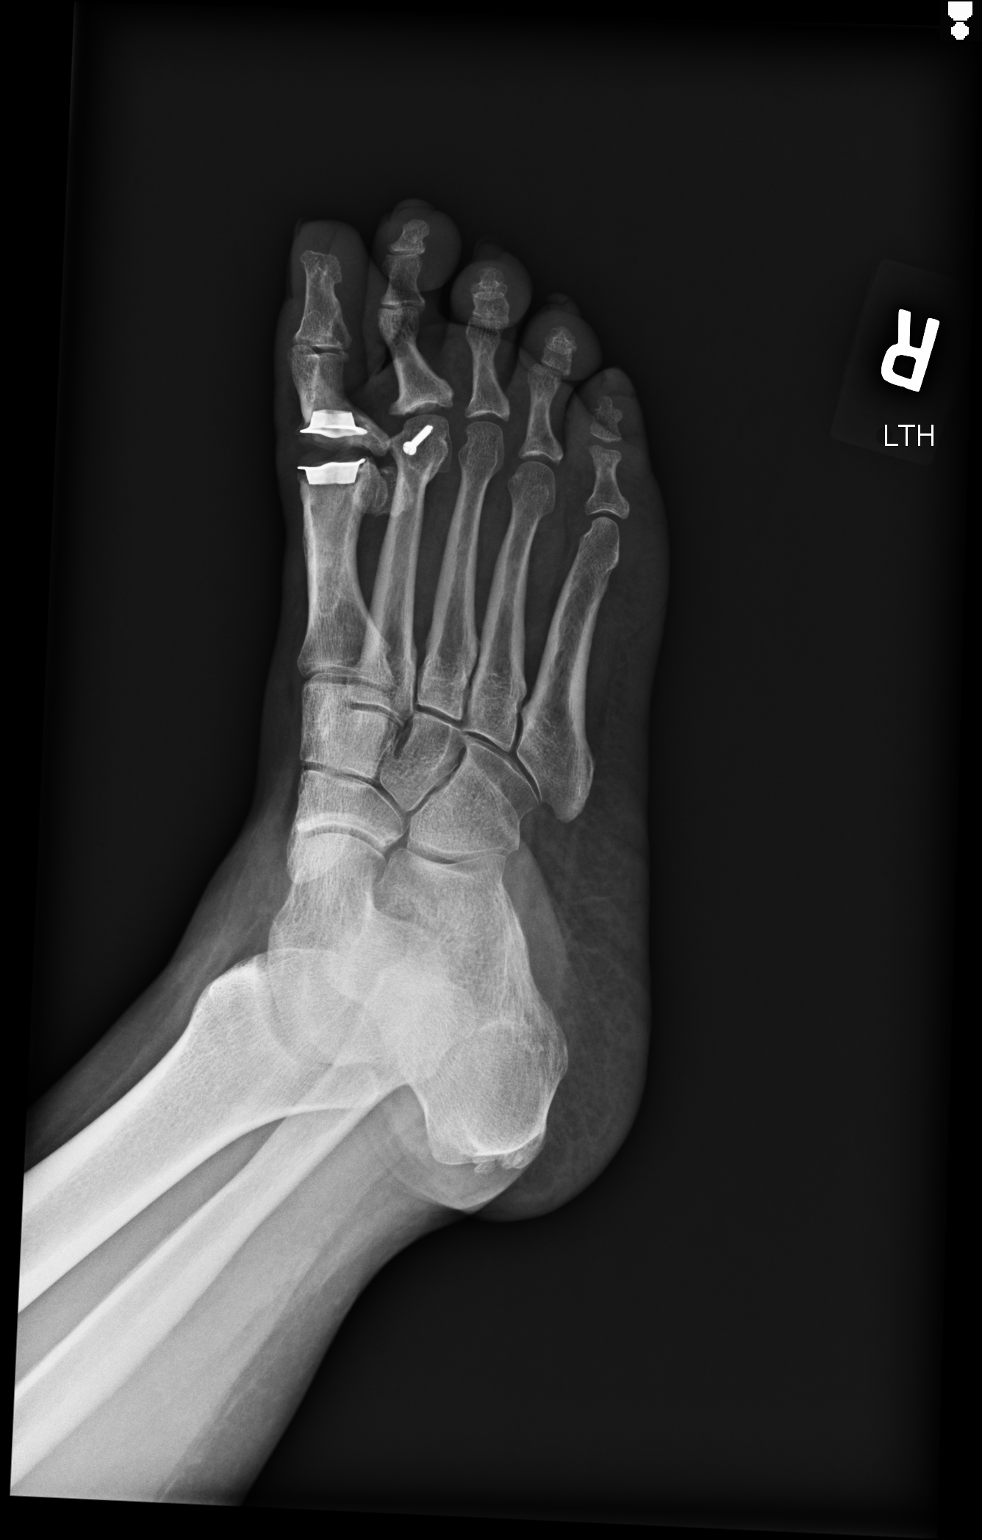
[im 3/3]
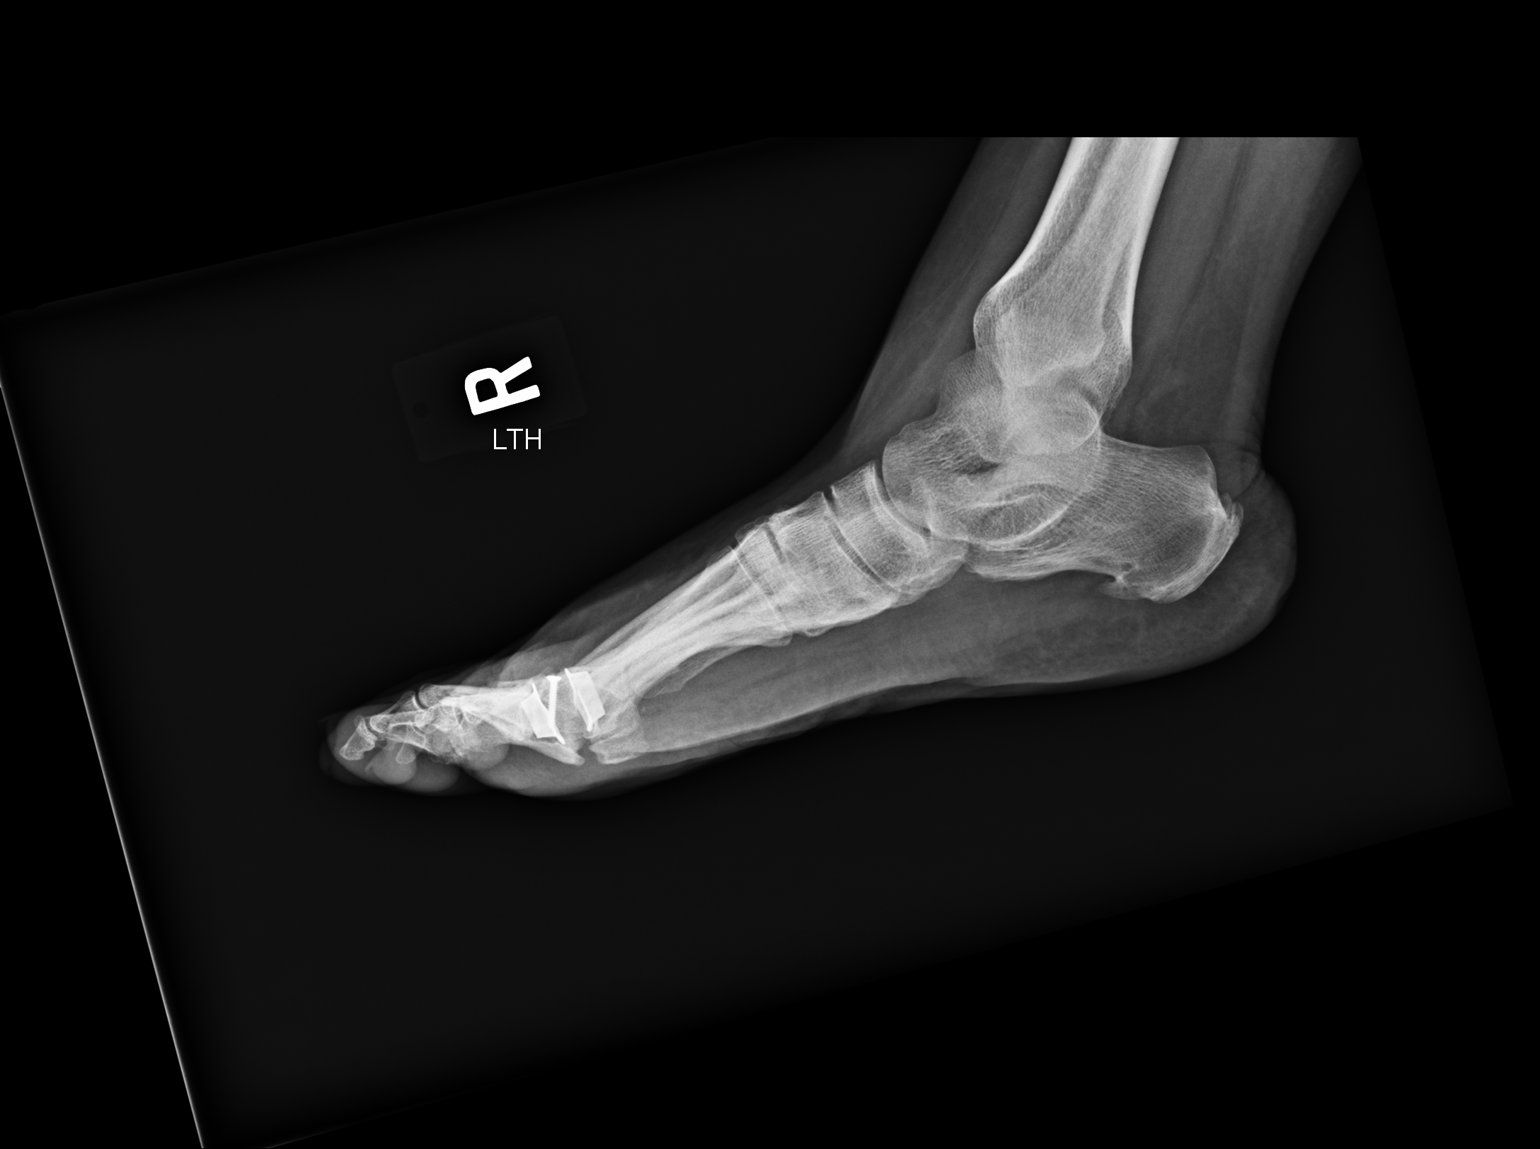

[3 of 3 positions shown; findings below may reference images not displayed]

FINDINGS: Patient is status post arthroplasty at the first metatarsal
phalangeal joint. There is a cortical screw within the head of the
second metatarsal. Pes planus deformity and calcaneal spurs are
noted. There is no evidence of acute fracture, dislocation or focal
soft tissue abnormality.
IMPRESSION: Postsurgical changes as described.  No acute osseous findings.

## 2016-08-14 ENCOUNTER — Encounter (HOSPITAL_COMMUNITY): Payer: Self-pay

## 2016-08-14 ENCOUNTER — Encounter (HOSPITAL_COMMUNITY)
Admission: RE | Admit: 2016-08-14 | Discharge: 2016-08-14 | Disposition: A | Discharge status: Home / Self Care | Payer: Medicare PPO | Source: Ambulatory Visit | Attending: General Surgery | Admitting: General Surgery

## 2016-08-14 DIAGNOSIS — K5660 Unspecified intestinal obstruction: Secondary | ICD-10-CM | POA: Diagnosis not present

## 2016-08-14 DIAGNOSIS — Z01812 Encounter for preprocedural laboratory examination: Secondary | ICD-10-CM | POA: Insufficient documentation

## 2016-08-14 DIAGNOSIS — R109 Unspecified abdominal pain: Secondary | ICD-10-CM | POA: Insufficient documentation

## 2016-08-14 DIAGNOSIS — K439 Ventral hernia without obstruction or gangrene: Secondary | ICD-10-CM | POA: Insufficient documentation

## 2016-08-14 LAB — BASIC METABOLIC PANEL
Anion gap: 4 — ABNORMAL LOW (ref 5–15)
BUN: 34 mg/dL — ABNORMAL HIGH (ref 6–20)
CO2: 31 mmol/L (ref 22–32)
Calcium: 9.1 mg/dL (ref 8.9–10.3)
Chloride: 105 mmol/L (ref 101–111)
Creatinine, Ser: 1.16 mg/dL — ABNORMAL HIGH (ref 0.44–1.00)
GFR calc Af Amer: 57 mL/min — ABNORMAL LOW (ref >60)
GFR calc non Af Amer: 49 mL/min — ABNORMAL LOW (ref >60)
Glucose, Bld: 101 mg/dL — ABNORMAL HIGH (ref 65–99)
Potassium: 3.5 mmol/L (ref 3.5–5.1)
Sodium: 140 mmol/L (ref 135–145)

## 2016-08-14 LAB — CBC
HCT: 32.2 % — ABNORMAL LOW (ref 36.0–46.0)
Hemoglobin: 10.4 g/dL — ABNORMAL LOW (ref 12.0–15.0)
MCH: 26.9 pg (ref 26.0–34.0)
MCHC: 32.3 g/dL (ref 30.0–36.0)
MCV: 83.2 fL (ref 78.0–100.0)
Platelets: 400 10*3/uL (ref 150–400)
RBC: 3.87 MIL/uL (ref 3.87–5.11)
RDW: 15.3 % (ref 11.5–15.5)
WBC: 6.2 10*3/uL (ref 4.0–10.5)

## 2016-08-14 NOTE — Patient Instructions (Signed)
Julie Acevedo  08/14/2016   Your procedure is scheduled on:   Report to [facility]  Entrance take [facility]  elevators to 3rd floor to  [place] at AM.  Call this number if you have problems the morning of surgery [phone]   Remember: ONLY 1 PERSON MAY GO WITH YOU TO SHORT STAY TO GET  READY MORNING OF [place].  Do not eat food or drink liquids :After Midnight.     Take these medicines the morning of surgery with A SIP OF WATER: amlodiopine ( NORvasc), Valium if needed, Linzess  DO NOT TAKE ANY DIABETIC MEDICATIONS DAY OF YOUR SURGERY                               You may not have any metal on your body including hair pins and              piercings  Do not wear jewelry, make-up, lotions, powders or perfumes, deodorant             Do not wear nail polish.  Do not shave  48 hours prior to surgery.                Do not bring valuables to the hospital. [place].  Contacts, dentures or bridgework may not be worn into surgery.  Leave suitcase in the car. After surgery it may be brought to your room.       Special Instructions: coughing and deep breathing exercises, leg exercises               Please read over the following fact sheets you were given: _____________________________________________________________________             [facility] - Preparing for Surgery Before surgery, you can play an important role.  Because skin is not sterile, your skin needs to be as free of germs as possible.  You can reduce the number of germs on your skin by washing with CHG (chlorahexidine gluconate) soap before surgery.  CHG is an antiseptic cleaner which kills germs and bonds with the skin to continue killing germs even after washing. Please DO NOT use if you have an allergy to CHG or antibacterial soaps.  If your skin becomes reddened/irritated stop using the CHG and inform your nurse when you arrive  at Short Stay. Do not shave (including legs and underarms) for at least 48 hours prior to the first CHG shower.  You may shave your face/neck. Please follow these instructions carefully:  1.  Shower with CHG Soap the night before surgery and the  morning of Surgery.  2.  If you choose to wash your hair, wash your hair first as usual with your  normal  shampoo.  3.  After you shampoo, rinse your hair and body thoroughly to remove the  shampoo.                           4.  Use CHG as you would any other liquid soap.  You can apply chg directly  to the skin and wash  Gently with a scrungie or clean washcloth.  5.  Apply the CHG Soap to your body ONLY FROM THE NECK DOWN.   Do not use on face/ open                           Wound or open sores. Avoid contact with eyes, ears mouth and genitals (private parts).                       Wash face,  Genitals (private parts) with your normal soap.             6.  Wash thoroughly, paying special attention to the area where your surgery  will be performed.  7.  Thoroughly rinse your body with warm water from the neck down.  8.  DO NOT shower/wash with your normal soap after using and rinsing off  the CHG Soap.                9.  Pat yourself dry with a clean towel.            10.  Wear clean pajamas.            11.  Place clean sheets on your bed the night of your first shower and do not  sleep with pets. Day of Surgery : Do not apply any lotions/deodorants the morning of surgery.  Please wear clean clothes to the hospital/surgery center.  FAILURE TO FOLLOW THESE INSTRUCTIONS MAY RESULT IN THE CANCELLATION OF YOUR SURGERY PATIENT SIGNATURE_________________________________  NURSE SIGNATURE__________________________________  ________________________________________________________________________

## 2016-08-14 NOTE — Progress Notes (Signed)
Stress test- 2010- EPIC - normal  ECHO-2015- EF 60-65 %  Admitted 2013 with chest pain - enzymes are negative.  Discharged with followup with PCP.  h andP on admission by Dr Percival Spanish - cardiology and [place]charge [place] by Dr [place]aryl Comes [place] [address] discuss cath in 2000 which was negative for CAD.  Patient has issues of memory problems and at preop appointment she told me she had history of MI  But when I looked in EPIC under Encounters I only could find the info listed above.

## 2016-08-14 NOTE — Progress Notes (Signed)
EKG-04/26/16-EPIC  CXR- 05/03/16- EPIC  DR Rosanna Randy- 06/28/16- LOV on chart

## 2016-08-14 NOTE — Progress Notes (Signed)
CBC and BMP done 08/14/16 routed via EPIC to Dr Excell Seltzer.

## 2016-08-15 LAB — HEMOGLOBIN A1C
Hgb A1c MFr Bld: 5.5 % (ref 4.8–5.6)
Mean Plasma Glucose: 111 mg/dL

## 2016-08-15 NOTE — Progress Notes (Signed)
Spoke Anesthesia on 08/15/2016 and made aware of patient history.  Patient denies any chest pain or shortness of breath at preop appointment.  No further orders given.

## 2016-08-16 ENCOUNTER — Telehealth: Payer: Self-pay | Admitting: *Deleted

## 2016-08-16 NOTE — Telephone Encounter (Signed)
"  My doctor told me I need mammograms more frequently.  My mammogram reads pre-cancerous cells, what does this mean."  Non-invasive breast when on mammogram something is too small to have formed a lump.

## 2016-08-21 ENCOUNTER — Inpatient Hospital Stay (HOSPITAL_COMMUNITY): Payer: Medicare PPO | Admitting: Anesthesiology

## 2016-08-21 ENCOUNTER — Encounter (HOSPITAL_COMMUNITY)
Admission: RE | Disposition: A | Discharge status: Home / Self Care | Payer: Self-pay | Source: Ambulatory Visit | Attending: General Surgery

## 2016-08-21 ENCOUNTER — Inpatient Hospital Stay (HOSPITAL_COMMUNITY)
Admission: RE | Admit: 2016-08-21 | Discharge: 2016-08-26 | DRG: 336 | Disposition: A | Discharge status: Home / Self Care | Payer: Medicare PPO | Source: Ambulatory Visit | Attending: General Surgery | Admitting: General Surgery

## 2016-08-21 ENCOUNTER — Encounter (HOSPITAL_COMMUNITY): Payer: Self-pay

## 2016-08-21 DIAGNOSIS — Z79899 Other long term (current) drug therapy: Secondary | ICD-10-CM

## 2016-08-21 DIAGNOSIS — N736 Female pelvic peritoneal adhesions (postinfective): Secondary | ICD-10-CM | POA: Diagnosis present

## 2016-08-21 DIAGNOSIS — Z6827 Body mass index (BMI) 27.0-27.9, adult: Secondary | ICD-10-CM

## 2016-08-21 DIAGNOSIS — Z882 Allergy status to sulfonamides status: Secondary | ICD-10-CM

## 2016-08-21 DIAGNOSIS — Z9884 Bariatric surgery status: Secondary | ICD-10-CM

## 2016-08-21 DIAGNOSIS — E11649 Type 2 diabetes mellitus with hypoglycemia without coma: Secondary | ICD-10-CM | POA: Diagnosis present

## 2016-08-21 DIAGNOSIS — E440 Moderate protein-calorie malnutrition: Secondary | ICD-10-CM | POA: Diagnosis present

## 2016-08-21 DIAGNOSIS — Z833 Family history of diabetes mellitus: Secondary | ICD-10-CM

## 2016-08-21 DIAGNOSIS — I10 Essential (primary) hypertension: Secondary | ICD-10-CM | POA: Diagnosis present

## 2016-08-21 DIAGNOSIS — Z8249 Family history of ischemic heart disease and other diseases of the circulatory system: Secondary | ICD-10-CM

## 2016-08-21 DIAGNOSIS — N134 Hydroureter: Secondary | ICD-10-CM | POA: Diagnosis present

## 2016-08-21 DIAGNOSIS — K432 Incisional hernia without obstruction or gangrene: Principal | ICD-10-CM | POA: Diagnosis present

## 2016-08-21 DIAGNOSIS — Z888 Allergy status to other drugs, medicaments and biological substances status: Secondary | ICD-10-CM | POA: Diagnosis not present

## 2016-08-21 DIAGNOSIS — Z91041 Radiographic dye allergy status: Secondary | ICD-10-CM | POA: Diagnosis not present

## 2016-08-21 DIAGNOSIS — M1990 Unspecified osteoarthritis, unspecified site: Secondary | ICD-10-CM | POA: Diagnosis present

## 2016-08-21 DIAGNOSIS — Z794 Long term (current) use of insulin: Secondary | ICD-10-CM | POA: Diagnosis not present

## 2016-08-21 DIAGNOSIS — R109 Unspecified abdominal pain: Secondary | ICD-10-CM | POA: Diagnosis present

## 2016-08-21 HISTORY — PX: LAPAROSCOPIC LYSIS OF ADHESIONS: SHX5905

## 2016-08-21 HISTORY — PX: VENTRAL HERNIA REPAIR: SHX424

## 2016-08-21 HISTORY — PX: INSERTION OF MESH: SHX5868

## 2016-08-21 LAB — CREATININE, SERUM
Creatinine, Ser: 0.86 mg/dL (ref 0.44–1.00)
GFR calc Af Amer: >60 mL/min (ref >60)
GFR calc non Af Amer: >60 mL/min (ref >60)

## 2016-08-21 LAB — GLUCOSE, CAPILLARY
Glucose-Capillary: 62 mg/dL — ABNORMAL LOW (ref 65–99)
Glucose-Capillary: 64 mg/dL — ABNORMAL LOW (ref 65–99)
Glucose-Capillary: 126 mg/dL — ABNORMAL HIGH (ref 65–99)
Glucose-Capillary: 99 mg/dL (ref 65–99)
Glucose-Capillary: 206 mg/dL — ABNORMAL HIGH (ref 65–99)
Glucose-Capillary: 100 mg/dL — ABNORMAL HIGH (ref 65–99)

## 2016-08-21 LAB — CBC
HCT: 30.1 % — ABNORMAL LOW (ref 36.0–46.0)
Hemoglobin: 9.9 g/dL — ABNORMAL LOW (ref 12.0–15.0)
MCH: 26.5 pg (ref 26.0–34.0)
MCHC: 32.9 g/dL (ref 30.0–36.0)
MCV: 80.5 fL (ref 78.0–100.0)
Platelets: 328 10*3/uL (ref 150–400)
RBC: 3.74 MIL/uL — ABNORMAL LOW (ref 3.87–5.11)
RDW: 15.1 % (ref 11.5–15.5)
WBC: 10 10*3/uL (ref 4.0–10.5)

## 2016-08-21 SURGERY — LYSIS, ADHESIONS, LAPAROSCOPIC
Anesthesia: General | Site: Abdomen

## 2016-08-21 MED ORDER — ONDANSETRON 4 MG PO TBDP
4.0000 mg | ORAL_TABLET | Freq: Four times a day (QID) | ORAL | Status: DC | PRN
  Administered 2016-08-26: 4 mg via ORAL
  Filled 2016-08-21: qty 1

## 2016-08-21 MED ORDER — LIDOCAINE HCL (CARDIAC) 20 MG/ML IV SOLN
INTRAVENOUS | Status: DC | PRN
  Administered 2016-08-21: 50 mg via INTRATRACHEAL

## 2016-08-21 MED ORDER — HYDROMORPHONE HCL 2 MG/ML IJ SOLN
INTRAMUSCULAR | Status: AC
  Filled 2016-08-21: qty 1

## 2016-08-21 MED ORDER — PROMETHAZINE HCL 25 MG/ML IJ SOLN: 6.2500 mg | INTRAMUSCULAR | Status: DC | PRN

## 2016-08-21 MED ORDER — DIAZEPAM 5 MG PO TABS
5.0000 mg | ORAL_TABLET | Freq: Four times a day (QID) | ORAL | Status: DC | PRN
  Administered 2016-08-25: 5 mg via ORAL
  Filled 2016-08-21: qty 1

## 2016-08-21 MED ORDER — HYDROMORPHONE HCL 1 MG/ML IJ SOLN
INTRAMUSCULAR | Status: DC | PRN
  Administered 2016-08-21 (×3): .2 mg via INTRAVENOUS
  Administered 2016-08-21: .4 mg via INTRAVENOUS

## 2016-08-21 MED ORDER — GABAPENTIN 100 MG PO CAPS
100.0000 mg | ORAL_CAPSULE | Freq: Three times a day (TID) | ORAL | Status: DC
  Administered 2016-08-21 – 2016-08-26 (×15): 100 mg via ORAL
  Filled 2016-08-21 (×15): qty 1

## 2016-08-21 MED ORDER — PNEUMOCOCCAL 13-VAL CONJ VACC IM SUSP
0.5000 mL | INTRAMUSCULAR | Status: DC
  Filled 2016-08-21: qty 0.5

## 2016-08-21 MED ORDER — ONDANSETRON HCL 4 MG/2ML IJ SOLN
4.0000 mg | Freq: Four times a day (QID) | INTRAMUSCULAR | Status: DC | PRN
  Administered 2016-08-22 – 2016-08-26 (×7): 4 mg via INTRAVENOUS
  Filled 2016-08-21 (×7): qty 2

## 2016-08-21 MED ORDER — FENTANYL CITRATE (PF) 100 MCG/2ML IJ SOLN
INTRAMUSCULAR | Status: DC | PRN
  Administered 2016-08-21 (×2): 50 ug via INTRAVENOUS

## 2016-08-21 MED ORDER — 0.9 % SODIUM CHLORIDE (POUR BTL) OPTIME
TOPICAL | Status: DC | PRN
  Administered 2016-08-21: 1000 mL

## 2016-08-21 MED ORDER — SODIUM CHLORIDE 0.9 % IJ SOLN
INTRAMUSCULAR | Status: DC | PRN
  Administered 2016-08-21: 50 mL via INTRAVENOUS

## 2016-08-21 MED ORDER — CEFOTETAN DISODIUM-DEXTROSE 2-2.08 GM-% IV SOLR
2.0000 g | INTRAVENOUS | Status: AC
  Administered 2016-08-21: 2 g via INTRAVENOUS

## 2016-08-21 MED ORDER — ROCURONIUM BROMIDE 100 MG/10ML IV SOLN
INTRAVENOUS | Status: DC | PRN
  Administered 2016-08-21: 20 mg via INTRAVENOUS
  Administered 2016-08-21: 10 mg via INTRAVENOUS
  Administered 2016-08-21: 30 mg via INTRAVENOUS

## 2016-08-21 MED ORDER — PROPOFOL 10 MG/ML IV BOLUS
INTRAVENOUS | Status: DC | PRN
  Administered 2016-08-21: 170 mg via INTRAVENOUS

## 2016-08-21 MED ORDER — DEXTROSE 50 % IV SOLN
25.0000 mL | Freq: Once | INTRAVENOUS | Status: AC
  Administered 2016-08-21: 25 mL via INTRAVENOUS

## 2016-08-21 MED ORDER — ENOXAPARIN SODIUM 40 MG/0.4ML ~~LOC~~ SOLN
40.0000 mg | SUBCUTANEOUS | Status: DC
  Administered 2016-08-22 – 2016-08-26 (×5): 40 mg via SUBCUTANEOUS
  Filled 2016-08-21 (×4): qty 0.4

## 2016-08-21 MED ORDER — MIDAZOLAM HCL 2 MG/2ML IJ SOLN
INTRAMUSCULAR | Status: DC | PRN
  Administered 2016-08-21: 2 mg via INTRAVENOUS

## 2016-08-21 MED ORDER — CEFOTETAN DISODIUM-DEXTROSE 2-2.08 GM-% IV SOLR
INTRAVENOUS | Status: AC
  Filled 2016-08-21: qty 50

## 2016-08-21 MED ORDER — ROCURONIUM BROMIDE 100 MG/10ML IV SOLN
INTRAVENOUS | Status: AC
  Filled 2016-08-21: qty 1

## 2016-08-21 MED ORDER — MEPERIDINE HCL 50 MG/ML IJ SOLN: 6.2500 mg | INTRAMUSCULAR | Status: DC | PRN

## 2016-08-21 MED ORDER — FENTANYL CITRATE (PF) 100 MCG/2ML IJ SOLN
INTRAMUSCULAR | Status: AC
  Filled 2016-08-21: qty 2

## 2016-08-21 MED ORDER — SUGAMMADEX SODIUM 200 MG/2ML IV SOLN
INTRAVENOUS | Status: DC | PRN
Start: 2016-08-21 — End: 2016-08-21
  Administered 2016-08-21: 130 mg via INTRAVENOUS

## 2016-08-21 MED ORDER — HYDROCHLOROTHIAZIDE 25 MG PO TABS
25.0000 mg | ORAL_TABLET | Freq: Every day | ORAL | Status: DC
  Administered 2016-08-21 – 2016-08-24 (×4): 25 mg via ORAL
  Filled 2016-08-21 (×4): qty 1

## 2016-08-21 MED ORDER — POTASSIUM CHLORIDE IN NACL 20-0.9 MEQ/L-% IV SOLN
INTRAVENOUS | Status: DC
  Administered 2016-08-21: 100 mL/h via INTRAVENOUS
  Administered 2016-08-22: 1000 mL via INTRAVENOUS
  Administered 2016-08-24 – 2016-08-26 (×3): via INTRAVENOUS
  Filled 2016-08-21 (×7): qty 1000

## 2016-08-21 MED ORDER — OXYCODONE-ACETAMINOPHEN 5-325 MG PO TABS
1.0000 | ORAL_TABLET | ORAL | Status: DC | PRN
  Administered 2016-08-23: 1 via ORAL
  Administered 2016-08-24 (×3): 2 via ORAL
  Administered 2016-08-24: 1 via ORAL
  Administered 2016-08-25 – 2016-08-26 (×5): 2 via ORAL
  Administered 2016-08-26: 1 via ORAL
  Filled 2016-08-21: qty 1
  Filled 2016-08-21 (×6): qty 2
  Filled 2016-08-21: qty 1
  Filled 2016-08-21 (×2): qty 2
  Filled 2016-08-21: qty 1
  Filled 2016-08-21: qty 2

## 2016-08-21 MED ORDER — HYDROMORPHONE HCL 1 MG/ML IJ SOLN
0.2500 mg | INTRAMUSCULAR | Status: DC | PRN
  Administered 2016-08-21: 0.5 mg via INTRAVENOUS

## 2016-08-21 MED ORDER — SODIUM CHLORIDE 0.9 % IJ SOLN
INTRAMUSCULAR | Status: AC
  Filled 2016-08-21: qty 50

## 2016-08-21 MED ORDER — ONDANSETRON HCL 4 MG/2ML IJ SOLN
INTRAMUSCULAR | Status: DC | PRN
  Administered 2016-08-21: 4 mg via INTRAVENOUS

## 2016-08-21 MED ORDER — DEXAMETHASONE SODIUM PHOSPHATE 10 MG/ML IJ SOLN
INTRAMUSCULAR | Status: DC | PRN
  Administered 2016-08-21: 10 mg via INTRAVENOUS

## 2016-08-21 MED ORDER — BUPIVACAINE LIPOSOME 1.3 % IJ SUSP
20.0000 mL | INTRAMUSCULAR | Status: AC
  Administered 2016-08-21: 20 mL
  Filled 2016-08-21: qty 20

## 2016-08-21 MED ORDER — LACTATED RINGERS IR SOLN
Status: DC | PRN
  Administered 2016-08-21: 1000 mL

## 2016-08-21 MED ORDER — INSULIN ASPART 100 UNIT/ML ~~LOC~~ SOLN
0.0000 [IU] | Freq: Three times a day (TID) | SUBCUTANEOUS | Status: DC
  Administered 2016-08-21 – 2016-08-22 (×2): 5 [IU] via SUBCUTANEOUS

## 2016-08-21 MED ORDER — CHLORHEXIDINE GLUCONATE CLOTH 2 % EX PADS: 6.0000 | MEDICATED_PAD | Freq: Once | CUTANEOUS | Status: DC

## 2016-08-21 MED ORDER — SODIUM CHLORIDE 0.9 % IV SOLN: 20.0000 mL | Freq: Once | INTRAVENOUS | Status: DC

## 2016-08-21 MED ORDER — AMLODIPINE BESYLATE 10 MG PO TABS
10.0000 mg | ORAL_TABLET | Freq: Every day | ORAL | Status: DC
  Administered 2016-08-22 – 2016-08-24 (×3): 10 mg via ORAL
  Filled 2016-08-21 (×3): qty 1

## 2016-08-21 MED ORDER — PNEUMOCOCCAL VAC POLYVALENT 25 MCG/0.5ML IJ INJ: 0.5000 mL | INJECTION | INTRAMUSCULAR | Status: DC

## 2016-08-21 MED ORDER — MORPHINE SULFATE (PF) 2 MG/ML IV SOLN
2.0000 mg | INTRAVENOUS | Status: DC | PRN
  Administered 2016-08-21 – 2016-08-23 (×9): 2 mg via INTRAVENOUS
  Administered 2016-08-23 (×2): 4 mg via INTRAVENOUS
  Filled 2016-08-21 (×3): qty 1
  Filled 2016-08-21: qty 2
  Filled 2016-08-21: qty 1
  Filled 2016-08-21: qty 2
  Filled 2016-08-21 (×6): qty 1

## 2016-08-21 MED ORDER — PROPOFOL 10 MG/ML IV BOLUS
INTRAVENOUS | Status: AC
  Filled 2016-08-21: qty 20

## 2016-08-21 MED ORDER — SUGAMMADEX SODIUM 200 MG/2ML IV SOLN
INTRAVENOUS | Status: AC
  Filled 2016-08-21: qty 2

## 2016-08-21 MED ORDER — LACTATED RINGERS IV SOLN
INTRAVENOUS | Status: DC
  Administered 2016-08-21: 10:00:00 via INTRAVENOUS

## 2016-08-21 MED ORDER — DEXTROSE 50 % IV SOLN
INTRAVENOUS | Status: AC
  Filled 2016-08-21: qty 50

## 2016-08-21 MED ORDER — MIDAZOLAM HCL 2 MG/2ML IJ SOLN
INTRAMUSCULAR | Status: AC
  Filled 2016-08-21: qty 2

## 2016-08-21 MED ORDER — HYDROMORPHONE HCL 1 MG/ML IJ SOLN
INTRAMUSCULAR | Status: AC
  Filled 2016-08-21: qty 1

## 2016-08-21 SURGICAL SUPPLY — 54 items
APL SKNCLS STERI-STRIP NONHPOA (GAUZE/BANDAGES/DRESSINGS)
APPLIER CLIP 5 13 M/L LIGAMAX5 (MISCELLANEOUS)
APR CLP MED LRG 5 ANG JAW (MISCELLANEOUS)
BENZOIN TINCTURE PRP APPL 2/3 (GAUZE/BANDAGES/DRESSINGS) IMPLANT
BINDER ABDOMINAL 12 ML 46-62 (SOFTGOODS) ×2 IMPLANT
CHLORAPREP W/TINT 26ML (MISCELLANEOUS) ×2 IMPLANT
CLIP APPLIE 5 13 M/L LIGAMAX5 (MISCELLANEOUS) IMPLANT
COVER SURGICAL LIGHT HANDLE (MISCELLANEOUS) ×4 IMPLANT
DECANTER SPIKE VIAL GLASS SM (MISCELLANEOUS) ×2 IMPLANT
DEVICE PMI PUNCTURE CLOSURE (MISCELLANEOUS) ×2 IMPLANT
DEVICE SECURE STRAP 25 ABSORB (INSTRUMENTS) ×2 IMPLANT
DEVICE TROCAR PUNCTURE CLOSURE (ENDOMECHANICALS) IMPLANT
DISSECTOR BLUNT TIP ENDO 5MM (MISCELLANEOUS) IMPLANT
DRAPE LAPAROSCOPIC ABDOMINAL (DRAPES) IMPLANT
DRAPE UTILITY XL STRL (DRAPES) ×2 IMPLANT
ELECT PENCIL ROCKER SW 15FT (MISCELLANEOUS) IMPLANT
ELECT REM PT RETURN 9FT ADLT (ELECTROSURGICAL) ×2
ELECTRODE REM PT RTRN 9FT ADLT (ELECTROSURGICAL) ×1 IMPLANT
GLOVE BIOGEL PI IND STRL 7.0 (GLOVE) ×1 IMPLANT
GLOVE BIOGEL PI INDICATOR 7.0 (GLOVE) ×1
GLOVE SURG SS PI 7.5 STRL IVOR (GLOVE) ×2 IMPLANT
GOWN STRL REUS W/TWL LRG LVL3 (GOWN DISPOSABLE) ×2 IMPLANT
GOWN STRL REUS W/TWL XL LVL3 (GOWN DISPOSABLE) ×4 IMPLANT
IRRIG SUCT STRYKERFLOW 2 WTIP (MISCELLANEOUS) ×2
IRRIGATION SUCT STRKRFLW 2 WTP (MISCELLANEOUS) ×1 IMPLANT
KIT BASIN OR (CUSTOM PROCEDURE TRAY) ×2 IMPLANT
LIQUID BAND (GAUZE/BANDAGES/DRESSINGS) ×2 IMPLANT
MARKER SKIN DUAL TIP RULER LAB (MISCELLANEOUS) ×4 IMPLANT
MESH VENTRALIGHT ST 6X8 (Mesh Specialty) ×2 IMPLANT
MESH VENTRLGHT ELLIPSE 8X6XMFL (Mesh Specialty) ×1 IMPLANT
NEEDLE SPNL 22GX3.5 QUINCKE BK (NEEDLE) ×2 IMPLANT
NS IRRIG 1000ML POUR BTL (IV SOLUTION) ×2 IMPLANT
PAD POSITIONING PINK XL (MISCELLANEOUS) IMPLANT
POSITIONER SURGICAL ARM (MISCELLANEOUS) ×2 IMPLANT
SCISSORS LAP 5X35 DISP (ENDOMECHANICALS) ×2 IMPLANT
SHEARS HARMONIC ACE PLUS 36CM (ENDOMECHANICALS) IMPLANT
SLEEVE ADV FIXATION 5X100MM (TROCAR) ×4 IMPLANT
SOLUTION ANTI FOG 6CC (MISCELLANEOUS) ×2 IMPLANT
STRIP CLOSURE SKIN 1/2X4 (GAUZE/BANDAGES/DRESSINGS) IMPLANT
SUT MNCRL AB 4-0 PS2 18 (SUTURE) ×2 IMPLANT
SUT NOVA NAB GS-21 0 18 T12 DT (SUTURE) ×4 IMPLANT
SUT PROLENE 0 CT 1 CR/8 (SUTURE) IMPLANT
TACKER 5MM HERNIA 3.5CML NAB (ENDOMECHANICALS) IMPLANT
TAPE CLOTH 4X10 WHT NS (GAUZE/BANDAGES/DRESSINGS) IMPLANT
TOWEL OR 17X26 10 PK STRL BLUE (TOWEL DISPOSABLE) ×2 IMPLANT
TRAY FOLEY W/METER SILVER 14FR (SET/KITS/TRAYS/PACK) ×2 IMPLANT
TRAY LAPAROSCOPIC (CUSTOM PROCEDURE TRAY) ×2 IMPLANT
TROCAR ADV FIXATION 11X100MM (TROCAR) IMPLANT
TROCAR ADV FIXATION 5X100MM (TROCAR) ×2 IMPLANT
TROCAR BLADELESS OPT 5 100 (ENDOMECHANICALS) IMPLANT
TROCAR XCEL BLUNT TIP 100MML (ENDOMECHANICALS) ×2 IMPLANT
TROCAR XCEL NON-BLD 11X100MML (ENDOMECHANICALS) IMPLANT
TROCAR XCEL UNIV SLVE 11M 100M (ENDOMECHANICALS) IMPLANT
TUBING INSUF HEATED (TUBING) ×2 IMPLANT

## 2016-08-21 NOTE — Anesthesia Postprocedure Evaluation (Signed)
Anesthesia Post Note  Patient: Julie Acevedo  Procedure(s) Performed: Procedure(s) (LRB): LAPAROSCOPIC LYSIS OF ADHESIONS (N/A) LAPAROSCOPIC VENTRAL HERNIA (N/A) INSERTION OF MESH (N/A)  Patient location during evaluation: PACU Anesthesia Type: General Level of consciousness: sedated and patient cooperative Pain management: pain level controlled Vital Signs Assessment: post-procedure vital signs reviewed and stable Respiratory status: spontaneous breathing Cardiovascular status: stable Anesthetic complications: no    Last Vitals:  Vitals:   08/21/16 1345 08/21/16 1423  BP: 105/70 (!) 112/59  Pulse: 90 88  Resp: 12 12  Temp: 36.4 C 36.4 C    Last Pain:  Vitals:   08/21/16 1345  TempSrc:   PainSc: 0-No pain                 Nolon Nations

## 2016-08-21 NOTE — Anesthesia Procedure Notes (Signed)
Procedure Name: Intubation Date/Time: 08/21/2016 10:09 AM Performed by: Pilar Grammes Pre-anesthesia Checklist: Patient identified, Emergency Drugs available, Suction available, Patient being monitored and Timeout performed Patient Re-evaluated:Patient Re-evaluated prior to inductionOxygen Delivery Method: Circle system utilized Preoxygenation: Pre-oxygenation with 100% oxygen Intubation Type: IV induction Ventilation: Mask ventilation without difficulty Laryngoscope Size: Miller and 2 Grade View: Grade II Tube type: Oral Tube size: 7.5 mm Number of attempts: 1 Airway Equipment and Method: Stylet Placement Confirmation: positive ETCO2,  ETT inserted through vocal cords under direct vision,  CO2 detector and breath sounds checked- equal and bilateral Secured at: 23 cm Tube secured with: Tape Dental Injury: Teeth and Oropharynx as per pre-operative assessment

## 2016-08-21 NOTE — Progress Notes (Signed)
Reported to Dr Lissa Hoard [facility]G of 64 upon arrival, only symptom patient has is sleepy

## 2016-08-21 NOTE — Anesthesia Preprocedure Evaluation (Signed)
Anesthesia Evaluation  Patient identified by MRN, date of birth, ID band Patient awake    Reviewed: Allergy & Precautions, NPO status , Patient's Chart, lab work & pertinent test results  Airway Mallampati: II  TM Distance: >3 FB Neck ROM: Full    Dental no notable dental hx.    Pulmonary neg pulmonary ROS,    Pulmonary exam normal breath sounds clear to auscultation       Cardiovascular hypertension, negative cardio ROS Normal cardiovascular exam Rhythm:Regular Rate:Normal     Neuro/Psych negative neurological ROS  negative psych ROS   GI/Hepatic negative GI ROS, Neg liver ROS,   Endo/Other  diabetes, Type 2, Oral Hypoglycemic Agents  Renal/GU negative Renal ROS     Musculoskeletal  (+) Arthritis ,   Abdominal   Peds  Hematology negative hematology ROS (+)   Anesthesia Other Findings   Reproductive/Obstetrics negative OB ROS                            Anesthesia Physical Anesthesia Plan  ASA: III  Anesthesia Plan: General   Post-op Pain Management:    Induction: Intravenous  Airway Management Planned: Oral ETT  Additional Equipment:   Intra-op Plan:   Post-operative Plan: Extubation in OR  Informed Consent: I have reviewed the patients History and Physical, chart, labs and discussed the procedure including the risks, benefits and alternatives for the proposed anesthesia with the patient or authorized representative who has indicated his/her understanding and acceptance.   Dental advisory given  Plan Discussed with: CRNA  Anesthesia Plan Comments:         Anesthesia Quick Evaluation

## 2016-08-21 NOTE — H&P (Signed)
History of Present Illness Marland Kitchen T. Dhanya Bogle MD; 07/27/2016 4:25 PM) The patient is a 65 year old female who presents with non-malignant abdominal pain. She returns to the office for follow-up of her abdominal pain. She has a history of laparoscopic gastric bypass in 2011 with excellent maintained weight loss. She has a previous low midline incision from GYN surgery and was noted to have an incisional hernia at the time of her bypass. She also has a remote history of partial colectomy for diverticulitis in the 1990s as well as cholecystectomy. Her hernia was temporarily reinforced with Vicryl mesh with plans for later repair at the time of her bypass. . I saw her in 2014 when she presented with lower abdominal pain. At that time I could not feel a hernia. We referred her for colonoscopy which which was done at [facility] and was negative. We also discussed a CT scan but for unclear reasons this was not done. She has actually had some degree of lower abdominal pain ever since. I saw her again in January for some persistent mid abdominal pain. She had presented to the emergency room in [place]. CT scan was performed. This showed 2 small approximately 2 cm midline ventral hernias containing fat at the umbilicus and just below. Also comment was made of a mild right hydroureter. After evaluation in January she was referred for upper endoscopy at [place] which was negative. I believe I was expecting to see her back following that procedure but she now returns. She states she has continued to have mid abdominal pain which is fairly constant but definitely worse after meals with pressure and feeling like the food sticks in her mid abdomen. No vomiting. She did have an acute exacerbation of her pain in May and was hospitalized again at [facility]. At that time CT scan showed dilated small bowel and decompressed colon with a suspected transition point at the ileum and the mid pelvic brim  consistent with small bowel obstruction. She was treated nonoperatively and improved somewhat but has continued to have pain. She describes pressure pain in her mid and lower abdomen which is there all the time but worse with eating. She feels she cannot eat enough and is losing some weight. She has some nausea but no vomiting. She feels constipated. No blood in her stools.   Problem List/Past Medical Edward Jolly, MD; 07/27/2016 4:25 PM) ABDOMINAL PAIN, CHRONIC, GENERALIZED (R10.84)  Other Problems Edward Jolly, MD; 07/27/2016 4:25 PM) Diabetes Mellitus Back Pain Oophorectomy Right. High blood pressure Arthritis  Past Surgical History Edward Jolly, MD; 07/27/2016 4:25 PM) Colon Removal - Partial Cataract Surgery Bilateral. Gallbladder Surgery - Open Foot Surgery Bilateral. Breast Biopsy Left. Anal Fissure Repair Gastric Bypass Resection of Small Bowel Oral Surgery Tonsillectomy Shoulder Surgery Left.  Diagnostic Studies History Edward Jolly, MD; 07/27/2016 4:25 PM) Mammogram 1-3 years ago Colonoscopy 1-5 years ago Pap Smear 1-5 years ago  Allergies Elbert Ewings, CMA; 07/27/2016 3:54 PM) Lotrel *ANTIHYPERTENSIVES* Niacin (Antihyperlipidemic) *ANTIHYPERLIPIDEMICS* Sulfa Antibiotics Contrast Media Ready-Box *MEDICAL DEVICES AND SUPPLIES*  Medication History Edward Jolly, MD; 07/27/2016 4:25 PM) AmLODIPine Besylate (10MG  Tablet, Oral) Active. HydroCHLOROthiazide (25MG  Tablet, Oral) Active. Linzess (145MCG Capsule, Oral) Active. MetFORMIN HCl (500MG  Tablet, Oral) Active. Lantus (100UNIT/ML Solution, Subcutaneous) Active. Labetalol HCl (200MG  Tablet, Oral) Active. Medications Reconciled TraMADol HCl (50MG  Tablet, 1-2 Tablet Oral every 4 to 6 hours as needed for pain, Taken starting 12/23/2015) Active. DiazePAM (5MG  Tablet, Oral) Active. Donepezil HCl (5MG  Tablet,  Oral) Active. Carafate (1GM Tablet,  Oral) Active.  Social History Edward Jolly, MD; 07/27/2016 4:25 PM) No drug use Caffeine use Tea. Tobacco use Never smoker. Alcohol use Remotely quit alcohol use.  Family History Edward Jolly, MD; 07/27/2016 4:25 PM) Heart disease in female family member before age 46 Diabetes Mellitus Mother. Respiratory Condition Mother. Hypertension Mother. Cancer Father.  Pregnancy / Birth History Edward Jolly, MD; 07/27/2016 4:25 PM) Age at menarche 89 years. Age of menopause <47 Irregular periods Gravida 4 Para 3 Maternal age 76-20  Vitals Elbert Ewings CMA; 07/27/2016 3:56 PM) 07/27/2016 3:55 PM Weight: 142 lb Height: 63in Body Surface Area: 1.67 m Body Mass Index: 25.15 kg/m  Temp.: 98.27F(Temporal)  Pulse: 94 (Regular)  BP: 140/82 (Sitting, Left Arm, Standard)       Physical Exam Marland Kitchen T. Bernadean Saling MD; 07/27/2016 4:32 PM) The physical exam findings are as follows: Note:General: Alert, fairly thin African-American female, in no distress Skin: Warm and dry without rash or infection. HEENT: No palpable masses or thyromegaly. Sclera nonicteric. Pupils equal round and reactive. Lymph nodes: No cervical, supraclavicular, or inguinal nodes palpable. Lungs: Breath sounds clear and equal. No wheezing or increased work of breathing. Cardiovascular: Regular rate and rhythm without murmer. No JVD or edema. Peripheral pulses intact. No carotid bruits. Abdomen: Nondistended. Moderate somewhat diffuse tenderness. Healed midline incision. There is somewhat increased tenderness and possibly some fullness in the right lower abdomen.. No masses palpable. No organomegaly. There is a definite reducible incisional hernia adjacent to the umbilicus that is quite tender.. Extremities: No edema or joint swelling or deformity. No chronic venous stasis changes. Neurologic: Alert and fully oriented. Gait normal. No focal weakness. Psychiatric: Normal mood and  affect. Thought content appropriate with normal judgement and insight    Assessment & Plan Marland Kitchen T. Rickiya Picariello MD; 07/27/2016 4:36 PM) ABDOMINAL PAIN, CHRONIC, GENERALIZED (R10.84) Impression: Abdominal pain post Roux-en-Y gastric bypass. History of other abdominal surgery including sigmoid colectomy and GYN surgery as well as cholecystectomy. She has a known ventral hernia. She has very significant pain and now weight loss. Possibilities include an internal hernia from her gastric bypass, adhesive small bowel obstruction or intermittent obstruction from her ventral hernia. I recommended proceeding with laparoscopy with careful examination of her small bowel for adhesions or internal hernia and then proceed with ventral hernia repair with mesh. I discussed the indications and nature of the surgery. I discussed use of mesh and possible need for open procedure. We discussed risks of general anesthetic, bleeding, infection or bowel injury. All her questions were answered and she desires to proceed. Current Plans Laparoscopy with lysis of adhesions and possible repair of internal hernia, repair of ventral abdominal hernia with mesh

## 2016-08-21 NOTE — Op Note (Signed)
Preoperative Diagnosis: abdominal pain, small bowel obstruction, ventral hernia  Postoprative Diagnosis: abdominal pain, small bowel obstruction, ventral hernia  Procedure: Procedure(s): LAPAROSCOPIC LYSIS OF ADHESIONS LAPAROSCOPIC VENTRAL HERNIA INSERTION OF MESH   Surgeon: Excell Seltzer T   Assistants: Greer Pickerel  Anesthesia:  General endotracheal anesthesia  Indications: Patient has a history of laparoscopic Roux-en-Y gastric bypass approximately 6 years ago with excellent weight loss. She however has recently had recurrent lower and mid abdominal pain. She has had one admission with CT scan showing an apparent partial distal small bowel obstruction with transition in the pelvis. She continues to have pain and difficulty with eating due to pain and nausea. Colonoscopy was normal. She has a known mid abdominal ventral incisional hernia from previous GYN surgery which we had planned to fix once she achieved weight loss. With this constellation of findings I have recommended proceeding with diagnostic laparoscopy with lysis of adhesions and examination for internal hernia and repair of her ventral hernia with mesh. The procedure and indications and risks have been discussed with the patient extensively detailed elsewhere and she agrees to proceed.    Procedure Detail:  Patient was brought to the operating room, placed in the supine position on the operating table, and general endotracheal anesthesia induced. He received preoperative IV antibiotics. PAS were in place. The abdomen was widely sterilely prepped and draped. Patient timeout was performed and correct procedure verified. Access was obtained using the upper portion of her previous low midline incision at the umbilicus with a 1 cm open Hassan incision and dissection was carried down under direct vision into the peritoneal cavity. The [facility] trocar was placed through a mattress suture of 0 Vicryl and pneumoperitoneum established.  Initially under direct vision 25 mm trochars were placed laterally in the left abdomen. Visualizing from here there were adhesions of omentum and some small bowel up around the midline and around her hernia. These were carefully taken down with sharp dissection. 2 hernia defects were identified one just above the umbilicus measuring about 2 cm in diameter and one below the umbilicus measuring about 1 cm in diameter. Chronically incarcerated omentum was taken down out of these hernia defects. There were some nearby small bowel adhesions that were filmy and taken down easily off the anterior abdominal wall until it was completely cleared. We then proceeded to carefully examine the entire small bowel. The gastrojejunostomy and Roux limb was identified and this was traced down distally to the jejunojejunostomy. All this bowel looked normal. The jejunojejunostomy appeared widely patent without any abnormalities in careful examination posterior to this showed no mesenteric defect. The biliopancreatic limb was then traced proximally to the ligament of Treitz. The common channel was then traced distally and there were a few filmy adhesions which were lysed as we came down into the pelvis. In the terminal ileum probably 20-25 cm above the ileocecal valve there was a dense adhesion and some kinking with the ileum and he's down into the left pelvic side wall. This was carefully taken down under direct vision with sharp dissection until the terminal ileum was completely mobilized up out of the pelvis and there was no further kinking or adhesions in the entire small bowel. Also noted was no Peterson defect proximally. At this point we proceeded with ventral hernia repair. Measuring the total length of the 2 defects and adding 5 cm superiorly and inferiorly and allowing wide lateral coverage we chose a 20 x 15 cm piece of VentralLight ST mesh. It was trimmed  slightly laterally as these were small defects in the midline. 6 stay  sutures of 0 Novafil were placed circumferentially and then the mesh was introduced through the [place] site and the mattress suture secured at this site. The mesh was deployed smoothly in all directions with the coated side down toward the bowel and then using corresponding small stab incision on the anterior abdominal wall which had been previously measured in relation to the sutures the transfixion sutures were brought up through the anterior abdominal wall and secured with nice broad deployment of the mesh. The mesh was then additionally tacked circumferentially and also with an inner circle around the hernia defects using the [place]. This appeared to provide very broad coverage in all directions. At this point the abdomen was carefully inspected for hemostasis or any evidence of injury and everything looked fine. The suture and trocar sites were infiltrated with Exparel. All CO2 was evacuated and trochars removed. Skin incisions were closed with subcutaneous particular Monocryl and Liquiban. Sponge needle and instrument counts were correct.     Findings: As above  Estimated Blood Loss:  Minimal         Drains: None  Blood Given: none          Specimens: None        Complications:  * No complications entered in OR log *         Disposition: PACU - hemodynamically stable.         Condition: stable

## 2016-08-21 NOTE — Progress Notes (Signed)
Hypoglycemic Event  CBG: 64  Treatment: D50 IV 25 mL  Symptoms: headache  Possible Reasons for Event: Inadequate meal intake  Comments/MD notified:Dr Claudie Leach, Ninfa Meeker

## 2016-08-21 NOTE — Interval H&P Note (Signed)
History and Physical Interval Note:  08/21/2016 9:39 AM  Julie Acevedo  has presented today for surgery, with the diagnosis of abdominal pain, small bowel obstruction, ventral hernia  The various methods of treatment have been discussed with the patient and family. After consideration of risks, benefits and other options for treatment, the patient has consented to  Procedure(s): LAPAROSCOPIC LYSIS OF ADHESIONS (N/A) LAPAROSCOPIC VENTRAL HERNIA POSSIBLE OPEN POSSIBLE REPAIR INTERNAL HERNIA (N/A) INSERTION OF MESH (N/A) as a surgical intervention .  The patient's history has been reviewed, patient examined, no change in status, stable for surgery.  I have reviewed the patient's chart and labs.  Questions were answered to the patient's satisfaction.     [name] T

## 2016-08-21 NOTE — Transfer of Care (Signed)
Immediate Anesthesia Transfer of Care Note  Patient: Julie Acevedo  Procedure(s) Performed: Procedure(s): LAPAROSCOPIC LYSIS OF ADHESIONS (N/A) LAPAROSCOPIC VENTRAL HERNIA (N/A) INSERTION OF MESH (N/A)  Patient Location: PACU  Anesthesia Type:General  Level of Consciousness: sedated and responds to stimulation  Airway & Oxygen Therapy: Patient connected to face mask oxygen  Post-op Assessment: Post -op Vital signs reviewed and stable  Post vital signs: stable  Last Vitals:  Vitals:   08/21/16 0802  BP: 126/76  Pulse: 82  Resp: 16  Temp: 37.1 C    Last Pain:  Vitals:   08/21/16 0803  TempSrc:   PainSc: 7          Complications: No apparent anesthesia complications

## 2016-08-22 ENCOUNTER — Encounter (HOSPITAL_COMMUNITY): Payer: Self-pay | Admitting: General Surgery

## 2016-08-22 LAB — BASIC METABOLIC PANEL
Anion gap: 7 (ref 5–15)
BUN: 11 mg/dL (ref 6–20)
CO2: 26 mmol/L (ref 22–32)
Calcium: 9.1 mg/dL (ref 8.9–10.3)
Chloride: 108 mmol/L (ref 101–111)
Creatinine, Ser: 0.71 mg/dL (ref 0.44–1.00)
GFR calc Af Amer: >60 mL/min (ref >60)
GFR calc non Af Amer: >60 mL/min (ref >60)
Glucose, Bld: 91 mg/dL (ref 65–99)
Potassium: 4 mmol/L (ref 3.5–5.1)
Sodium: 141 mmol/L (ref 135–145)

## 2016-08-22 LAB — CBC
HCT: 31.2 % — ABNORMAL LOW (ref 36.0–46.0)
Hemoglobin: 10 g/dL — ABNORMAL LOW (ref 12.0–15.0)
MCH: 26 pg (ref 26.0–34.0)
MCHC: 32.1 g/dL (ref 30.0–36.0)
MCV: 81.3 fL (ref 78.0–100.0)
Platelets: 349 10*3/uL (ref 150–400)
RBC: 3.84 MIL/uL — ABNORMAL LOW (ref 3.87–5.11)
RDW: 15.3 % (ref 11.5–15.5)
WBC: 9.9 10*3/uL (ref 4.0–10.5)

## 2016-08-22 LAB — GLUCOSE, CAPILLARY
Glucose-Capillary: 118 mg/dL — ABNORMAL HIGH (ref 65–99)
Glucose-Capillary: 96 mg/dL (ref 65–99)
Glucose-Capillary: 217 mg/dL — ABNORMAL HIGH (ref 65–99)
Glucose-Capillary: 91 mg/dL (ref 65–99)

## 2016-08-22 MED ORDER — PNEUMOCOCCAL VAC POLYVALENT 25 MCG/0.5ML IJ INJ: 0.5000 mL | INJECTION | INTRAMUSCULAR | Status: DC

## 2016-08-22 MED ORDER — BOOST / RESOURCE BREEZE PO LIQD
1.0000 | Freq: Three times a day (TID) | ORAL | Status: DC
Start: 2016-08-22 — End: 2016-08-26
  Administered 2016-08-22 – 2016-08-26 (×11): 1 via ORAL

## 2016-08-22 MED ORDER — ADULT MULTIVITAMIN W/MINERALS CH
1.0000 | ORAL_TABLET | Freq: Every day | ORAL | Status: DC
  Administered 2016-08-22 – 2016-08-26 (×5): 1 via ORAL
  Filled 2016-08-22 (×4): qty 1

## 2016-08-22 MED ORDER — NAPHAZOLINE-GLYCERIN 0.012-0.2 % OP SOLN
1.0000 [drp] | Freq: Four times a day (QID) | OPHTHALMIC | Status: DC | PRN
  Filled 2016-08-22: qty 15

## 2016-08-22 NOTE — Progress Notes (Signed)
Patient ID: Julie Acevedo, female   DOB: 08-01-1951, 65 y.o.   MRN: [id number] 1 Day Post-Op  Subjective: Quite a bit of incisional pain with moving, improved with meds. Some nausea but no vomiting. Has been walking in the halls. Complains of some irritation right eye  Objective: Vital signs in last 24 hours: Temp:  [97.5 F (36.4 C)-98.7 F (37.1 C)] 98 F (36.7 C) (09/06 0530) Pulse Rate:  [68-101] 68 (09/06 0530) Resp:  [8-19] 18 (09/06 0530) BP: (105-127)/(52-99) 122/82 (09/06 0530) SpO2:  [97 %-100 %] 100 % (09/06 0530) Weight:  [69.8 kg (153 lb 14.1 oz)] 69.8 kg (153 lb 14.1 oz) (09/05 1914) Last BM Date: 08/21/16  Intake/Output from previous day: 09/05 0701 - 09/06 0700 In: 2205 [P.O.:120; I.V.:2085] Out: 1900 [Urine:1875; Blood:25] Intake/Output this shift: No intake/output data recorded.  General appearance: alert, cooperative and no distress Eyes: positive findings: conjunctiva: Right eye injection, mild Resp: clear to auscultation bilaterally GI: Mild appropriate tenderness. Nondistended. Incision/Wound: Dressing clean and dry  Lab Results:   Recent Labs  08/21/16 2207 08/22/16 0447  WBC 10.0 9.9  HGB 9.9* 10.0*  HCT 30.1* 31.2*  PLT 328 349   BMET  Recent Labs  08/21/16 2207 08/22/16 0447  NA  --  141  K  --  4.0  CL  --  108  CO2  --  26  GLUCOSE  --  91  BUN  --  11  CREATININE 0.86 0.71  CALCIUM  --  9.1     Studies/Results: No results found.  Anti-infectives: Anti-infectives    Start     Dose/Rate Route Frequency Ordered Stop   08/21/16 0750  cefoTEtan in Dextrose 5% (CEFOTAN) IVPB 2 g     2 g 100 mL/hr  Intravenous On call to O.R. 08/21/16 0750 08/21/16 1015      Assessment/Plan: s/p Procedure(s): LAPAROSCOPIC LYSIS OF ADHESIONS LAPAROSCOPIC VENTRAL HERNIA INSERTION OF MESH Stable postoperatively. Continue clear liquids today due to some nausea. Ambulation encouraged. Mild right eye irritation probably from lubricant at  surgery. Drops ordered.   LOS: 1 day    Loucile Posner T 08/22/2016

## 2016-08-22 NOTE — Progress Notes (Signed)
Initial Nutrition Assessment  DOCUMENTATION CODES:   Non-severe (moderate) malnutrition in context of chronic illness  INTERVENTION:  Boost Breeze po TID, each supplement provides 250 kcal and 9 grams of protein.  Recommend snacks TID between meals. Patient will order - discussed options for when diet advanced.  Recommend MVM daily in setting of hx of gastric bypass.   NUTRITION DIAGNOSIS:   Malnutrition (Moderate) related to chronic illness (Small bowel obstruction) as evidenced by energy intake < 75% for > or equal to 1 month, moderate depletions of muscle mass, moderate depletion of body fat, 10 percent weight loss over 6 months.  GOAL:   Patient will meet greater than or equal to 90% of their needs  MONITOR:   PO intake, Supplement acceptance, Diet advancement, I & O's  REASON FOR ASSESSMENT:   Malnutrition Screening Tool    ASSESSMENT:   65 yo female with medical history significant for DM, history of laparoscopic gastric bypass in 2011. who presents with non-malignant abdominal pain. Found to have small bowel obstruction and ventral hernia. Now s/p laparoscopic lysis of adhesion, ventral hernia insertion of mesh on 08/21/2016.    Patient reports poor appetite with decreased intake (<50% of usual intake) for the past few months. She has been experiencing nausea, post-prandial abdominal pain, and constipation. UBW is 156 lbs, reports she has lost weight to 142 lbs in the past few months. Weight on admission of 153 lbs appears to be incorrect as it is significantly higher than trend over past few months. Per chart, patient has lost 6.8 kg (10% of body weight) over 6 months (since 02/28/2016).  Patient recently stopped taking vitamins because of concern that they were causing constipation. Reinforced importance of taking vitamins daily. She receives a shot for B12.  Medications reviewed and include: Novolog sliding scale TID with meals, NS + KCl 20 mEq/L @ 58ml/hr.  Labs  reviewed: CBG 99-126 past 24 hrs, HgbA1c 5.5 on 08/14/2016.  Nutrition-Focused physical exam completed. Findings are moderate fat depletion, moderate muscle depletion, and no edema. Patient endorses noticeable changes in muscle mass on face and upper body.   Discussed plan with RN. Diet may advance to full liquids later today, but not confirmed yet.   Patient is amenable to eating snacks TID and drinking Boost Breeze TID. Even when diet advanced she does not want Ensure Enlive.  Diet Order:  Diet clear liquid Room service appropriate? Yes; Fluid consistency: Thin  Skin:  Wound (see comment) (Closed abdominal incision)  Last BM:  08/21/2016  Height:   Ht Readings from Last 1 Encounters:  08/21/16 5\' 3"  (1.6 m)    Weight:   Wt Readings from Last 1 Encounters:  08/21/16 153 lb 14.1 oz (69.8 kg)    Ideal Body Weight:  52.27 kg  BMI:  Body mass index is 27.26 kg/m.  Estimated Nutritional Needs:   Kcal:  1500-1700  Protein:  80-90 grams  Fluid:  >/= 1.5 L/day  EDUCATION NEEDS:   Education needs addressed (Importance of adequate protein and calories in diet. Discussed options high in protein. Small, frequent meals.)  Willey Blade, MS, RD, LDN Pager: [phone] After Hours Pager: [phone]

## 2016-08-23 LAB — GLUCOSE, CAPILLARY
Glucose-Capillary: 48 mg/dL — ABNORMAL LOW (ref 65–99)
Glucose-Capillary: 101 mg/dL — ABNORMAL HIGH (ref 65–99)
Glucose-Capillary: 52 mg/dL — ABNORMAL LOW (ref 65–99)
Glucose-Capillary: 50 mg/dL — ABNORMAL LOW (ref 65–99)
Glucose-Capillary: 138 mg/dL — ABNORMAL HIGH (ref 65–99)
Glucose-Capillary: 74 mg/dL (ref 65–99)

## 2016-08-23 NOTE — Progress Notes (Signed)
Inpatient Diabetes Program Recommendations  AACE/ADA: New Consensus Statement on Inpatient Glycemic Control (2015)  Target Ranges:  Prepandial:   less than 140 mg/dL      Peak postprandial:   less than 180 mg/dL (1-2 hours)      Critically ill patients:  140 - 180 mg/dL   Results for PEARLE, GABEL (MRN [id number]) as of 08/23/2016 09:41  Ref. Range 08/23/2016 08:12 08/23/2016 08:56  Glucose-Capillary Latest Ref Range: 65 - 99 mg/dL 48 (L) 101 (H)    Admit with: Hernia Repair  History: DM  Home DM Meds: Metformin 500 mg bid  Current Insulin Orders: Novolog Moderate Correction Scale/ SSI (0-15 units) TID AC      MD- Patient with Hypoglycemia this AM.  Please consider reducing Novolog SSI to Sensitive Correction Scale/ SSI (0-9 units) TID AC + HS (currently ordered as Moderate scale 0-15 units)      --Will follow patient during hospitalization--  Wyn Quaker RN, MSN, CDE Diabetes Coordinator Inpatient Glycemic Control Team Team Pager: [phone] (8a-5p)

## 2016-08-23 NOTE — Progress Notes (Signed)
Patient ID: Julie Acevedo, female   DOB: [birth year], 66 y.o.   MRN: [id number] 2 Days Post-Op  Subjective: Having a lot of pain around the incisions. Slight nausea. Has had flatus but no bowel movements.  Objective: Vital signs in last 24 hours: Temp:  [97.7 F (36.5 C)-97.8 F (36.6 C)] 97.7 F (36.5 C) (09/07 0434) Pulse Rate:  [79-81] 81 (09/07 0434) Resp:  [14-16] 14 (09/07 0434) BP: (108-117)/(61-66) 110/61 (09/07 0434) SpO2:  [95 %-100 %] 97 % (09/07 0434) Last BM Date: 08/21/16  Intake/Output from previous day: 09/06 0701 - 09/07 0700 In: 1844.2 [P.O.:480; I.V.:1364.2] Out: 2540 [Urine:2540] Intake/Output this shift: No intake/output data recorded.  General appearance: alert, cooperative and no distress GI: Nondistended. Bowel sounds present. Very tender around the incisions with rest of abdomen relatively nontender. Incision/Wound: Clean and dry  Lab Results:   Recent Labs  08/21/16 2207 08/22/16 0447  WBC 10.0 9.9  HGB 9.9* 10.0*  HCT 30.1* 31.2*  PLT 328 349   BMET  Recent Labs  08/21/16 2207 08/22/16 0447  NA  --  141  K  --  4.0  CL  --  108  CO2  --  26  GLUCOSE  --  91  BUN  --  11  CREATININE 0.86 0.71  CALCIUM  --  9.1   CBG (last 3)   Recent Labs  08/22/16 1643 08/22/16 2138 08/23/16 0812  GLUCAP 217* 91 48*      Studies/Results: No results found.  Anti-infectives: Anti-infectives    Start     Dose/Rate Route Frequency Ordered Stop   08/21/16 0750  cefoTEtan in Dextrose 5% (CEFOTAN) IVPB 2 g     2 g 100 mL/hr  Intravenous On call to O.R. 08/21/16 0750 08/21/16 1015      Assessment/Plan: s/p Procedure(s): LAPAROSCOPIC LYSIS OF ADHESIONS LAPAROSCOPIC VENTRAL HERNIA INSERTION OF MESH Stable but still a lot of incisional pain. Try oral pain medication which may last longer. Advance diet.   LOS: 2 days    Adger Cantera T 08/23/2016

## 2016-08-24 LAB — BASIC METABOLIC PANEL
Anion gap: 6 (ref 5–15)
BUN: 8 mg/dL (ref 6–20)
CO2: 31 mmol/L (ref 22–32)
Calcium: 9 mg/dL (ref 8.9–10.3)
Chloride: 105 mmol/L (ref 101–111)
Creatinine, Ser: 0.76 mg/dL (ref 0.44–1.00)
GFR calc Af Amer: >60 mL/min (ref >60)
GFR calc non Af Amer: >60 mL/min (ref >60)
Glucose, Bld: 89 mg/dL (ref 65–99)
Potassium: 4.3 mmol/L (ref 3.5–5.1)
Sodium: 142 mmol/L (ref 135–145)

## 2016-08-24 LAB — CBC
HCT: 29.3 % — ABNORMAL LOW (ref 36.0–46.0)
Hemoglobin: 9.3 g/dL — ABNORMAL LOW (ref 12.0–15.0)
MCH: 25.8 pg — ABNORMAL LOW (ref 26.0–34.0)
MCHC: 31.7 g/dL (ref 30.0–36.0)
MCV: 81.4 fL (ref 78.0–100.0)
Platelets: 349 10*3/uL (ref 150–400)
RBC: 3.6 MIL/uL — ABNORMAL LOW (ref 3.87–5.11)
RDW: 15.5 % (ref 11.5–15.5)
WBC: 8.2 10*3/uL (ref 4.0–10.5)

## 2016-08-24 LAB — GLUCOSE, CAPILLARY
Glucose-Capillary: 112 mg/dL — ABNORMAL HIGH (ref 65–99)
Glucose-Capillary: 62 mg/dL — ABNORMAL LOW (ref 65–99)
Glucose-Capillary: 126 mg/dL — ABNORMAL HIGH (ref 65–99)
Glucose-Capillary: 138 mg/dL — ABNORMAL HIGH (ref 65–99)
Glucose-Capillary: 192 mg/dL — ABNORMAL HIGH (ref 65–99)

## 2016-08-24 MED ORDER — OXYCODONE-ACETAMINOPHEN 5-325 MG PO TABS: 1.0000 | ORAL_TABLET | ORAL | 0 refills | Status: DC | PRN

## 2016-08-24 MED ORDER — METFORMIN HCL 500 MG PO TABS
500.0000 mg | ORAL_TABLET | Freq: Two times a day (BID) | ORAL | Status: DC
  Administered 2016-08-24: 500 mg via ORAL
  Filled 2016-08-24 (×3): qty 1

## 2016-08-24 NOTE — Progress Notes (Signed)
Patient ID: Julie Acevedo, female   DOB: 1951/03/06, 65 y.o.   MRN: [id number] 3 Days Post-Op  Subjective: Still a lot of abdominal/incisional pain, no better. A lot of difficulty getting up and down. Pain is worse with motion. She has been walking in the halls. Tolerating regular diet and passing flatus.  Objective: Vital signs in last 24 hours: Temp:  [98 F (36.7 C)-98.6 F (37 C)] 98.6 F (37 C) (09/08 [id number]) Pulse Rate:  [74-82] 78 (09/08 0633) Resp:  [15-16] 16 (09/08 0633) BP: (101-132)/(54-71) 101/54 (09/08 0633) SpO2:  [96 %-100 %] 96 % (09/08 0633) Last BM Date: 08/21/16  Intake/Output from previous day: 09/07 0701 - 09/08 0700 In: 1680 [P.O.:480; I.V.:1200] Out: 1450 [Urine:1450] Intake/Output this shift: No intake/output data recorded.  General appearance: alert, cooperative and no distress GI: Nondistended. Very tender over incisions and areas of mesh with rest of abdomen less tender  Lab Results:   Recent Labs  08/21/16 2207 08/22/16 0447  WBC 10.0 9.9  HGB 9.9* 10.0*  HCT 30.1* 31.2*  PLT 328 349   BMET  Recent Labs  08/21/16 2207 08/22/16 0447  NA  --  141  K  --  4.0  CL  --  108  CO2  --  26  GLUCOSE  --  91  BUN  --  11  CREATININE 0.86 0.71  CALCIUM  --  9.1   CBG (last 3)   Recent Labs  08/23/16 1450 08/23/16 1535 08/23/16 2215  GLUCAP 50* 138* 74      Studies/Results: No results found.  Anti-infectives: Anti-infectives    Start     Dose/Rate Route Frequency Ordered Stop   08/21/16 0750  cefoTEtan in Dextrose 5% (CEFOTAN) IVPB 2 g     2 g 100 mL/hr  Intravenous On call to O.R. 08/21/16 0750 08/21/16 1015      Assessment/Plan: s/p Procedure(s): LAPAROSCOPIC LYSIS OF ADHESIONS LAPAROSCOPIC VENTRAL HERNIA INSERTION OF MESH Still a lot of pain which seems to be abdominal wall/incisional pain. Doubt more serious complication. Check CBC today. If leukocytosis or pain worsens may need CT scan. Has had hypoglycemia. Will stop  SSI and put back on regular metformin. Hopefully home over the weekend if pain improves.   LOS: 3 days    Lazar Tierce T 08/24/2016

## 2016-08-24 NOTE — Progress Notes (Signed)
Nutrition Follow-up  DOCUMENTATION CODES:   Non-severe (moderate) malnutrition in context of chronic illness  INTERVENTION:  Continue Boost Breeze po TID, each supplement provides 250 kcal and 9 grams of protein Encourage PO intake  NUTRITION DIAGNOSIS:   Malnutrition (Moderate) related to chronic illness (Small bowel obstruction) as evidenced by energy intake < 75% for > or equal to 1 month, moderate depletions of muscle mass, moderate depletion of body fat, percent weight loss. -Ongoing  GOAL:   Patient will meet greater than or equal to 90% of their needs -Not meeting currently  MONITOR:   PO intake, Supplement acceptance, Diet advancement, I & O's  REASON FOR ASSESSMENT:   Malnutrition Screening Tool    ASSESSMENT:   65 yo female with medical history significant for DM, history of laparoscopic gastric bypass in 2011. who presents with non-malignant abdominal pain. Found to have small bowel obstruction and ventral hernia. Now s/p laparoscopic lysis of adhesion, ventral hernia insertion of mesh on 08/21/2016.   Ms. Antoun is struggling with PO intake. She doesn't have much appetite. Some nausea, receiving PRN Zofran. She is also sleepy. Consumed only a muffin for breakfast, for lunch she didn't eat much, tray was at bedside during visit, but she was in the process of consuming a boost breeze. Follow for PO intake. Labs and medications reviewed: CBGs 62-126 MVI w/ minerals NaCl w/ KCL 38mEq/L @ 56mL/hr  Diet Order:  Diet Carb Modified Fluid consistency: Thin; Room service appropriate? Yes  Skin:  Wound (see comment) (Closed abdominal incision)  Last BM:  08/21/2016  Height:   Ht Readings from Last 1 Encounters:  08/21/16 5\' 3"  (1.6 m)    Weight:   Wt Readings from Last 1 Encounters:  08/21/16 153 lb 14.1 oz (69.8 kg)    Ideal Body Weight:  52.27 kg  BMI:  Body mass index is 27.26 kg/m.  Estimated Nutritional Needs:   Kcal:  1500-1700  Protein:  80-90  grams  Fluid:  >/= 1.5 L/day  EDUCATION NEEDS:   Education needs addressed (Importance of adequate protein and calories in diet. Discussed options high in protein. Small, frequent meals.)  Satira Anis. Kayton Dunaj, MS, RD LDN Inpatient Clinical Dietitian Pager [phone]

## 2016-08-24 NOTE — Discharge Instructions (Signed)
[facility] ______[facility] °LAPAROSCOPIC SURGERY: POST OP INSTRUCTIONS °Always review your discharge instruction sheet given to you by the facility where your surgery was performed. °IF YOU HAVE DISABILITY OR FAMILY LEAVE FORMS, YOU MUST BRING THEM TO THE OFFICE FOR PROCESSING.   °DO NOT GIVE THEM TO YOUR DOCTOR. ° °1. A prescription for pain medication may be given to you upon discharge.  Take your pain medication as prescribed, if needed.  If narcotic pain medicine is not needed, then you may take acetaminophen (Tylenol) or ibuprofen (Advil) as needed. °2. Take your usually prescribed medications unless otherwise directed. °3. If you need a refill on your pain medication, please contact your pharmacy.  They will contact our office to request authorization. Prescriptions will not be filled after 5pm or on week-ends. °4. You should follow a light diet the first few days after arrival home, such as soup and crackers, etc.  Be sure to include lots of fluids daily. °5. Most patients will experience some swelling and bruising in the area of the incisions.  Ice packs will help.  Swelling and bruising can take several days to resolve.  °6. It is common to experience some constipation if taking pain medication after surgery.  Increasing fluid intake and taking a stool softener (such as Colace) will usually help or prevent this problem from occurring.  A mild laxative (Milk of Magnesia or Miralax) should be taken according to package instructions if there are no bowel movements after 48 hours. °7. Unless discharge instructions indicate otherwise, you may remove your bandages 24-48 hours after surgery, and you may shower at that time.  You may have steri-strips (small skin tapes) in place directly over the incision.  These strips should be left on the skin for 7-10 days.  If your surgeon used skin glue on the incision, you may shower in 24 hours.  The glue will flake off over the next 2-3 weeks.  Any sutures or  staples will be removed at the office during your follow-up visit. °8. ACTIVITIES:  You may resume regular (light) daily activities beginning the next day--such as daily self-care, walking, climbing stairs--gradually increasing activities as tolerated.  You may have sexual intercourse when it is comfortable.  Refrain from any heavy lifting or straining until approved by your doctor. °a. You may drive when you are no longer taking prescription pain medication, you can comfortably wear a seatbelt, and you can safely maneuver your car and apply brakes. °b. RETURN TO WORK:  __________________________________________________________ °9. You should see your doctor in the office for a follow-up appointment approximately 2-3 weeks after your surgery.  Make sure that you call for this appointment within a day or two after you arrive home to insure a convenient appointment time. °10. OTHER INSTRUCTIONS: __________________________________________________________________________________________________________________________ __________________________________________________________________________________________________________________________ °WHEN TO CALL YOUR DOCTOR: °1. Fever over 101.0 °2. Inability to urinate °3. Continued bleeding from incision. °4. Increased pain, redness, or drainage from the incision. °5. Increasing abdominal pain ° °The clinic staff is available to answer your questions during regular business hours.  Please don’t hesitate to call and ask to speak to one of the nurses for clinical concerns.  If you have a medical emergency, go to the nearest emergency room or call 911.  A surgeon from [facility] is always on call at the hospital. °[address], [address], [place], [place]  [address] ? [address] [address], [place], [place]   [address] °[phone] ? [phone] ? FAX [phone] °Web site:   [email or web address] °

## 2016-08-25 LAB — GLUCOSE, CAPILLARY
Glucose-Capillary: 70 mg/dL (ref 65–99)
Glucose-Capillary: 202 mg/dL — ABNORMAL HIGH (ref 65–99)
Glucose-Capillary: 77 mg/dL (ref 65–99)
Glucose-Capillary: 138 mg/dL — ABNORMAL HIGH (ref 65–99)

## 2016-08-25 MED ORDER — PROMETHAZINE HCL 25 MG/ML IJ SOLN: 12.5000 mg | Freq: Four times a day (QID) | INTRAMUSCULAR | Status: DC | PRN

## 2016-08-25 NOTE — Progress Notes (Signed)
Patient ID: Julie Acevedo, female   DOB: 02/05/1951, 65 y.o.   MRN: [id number]  [facility]  Subjective: POD#4  Patient complains of pain.  Passing flatus, no BM's.  Taking limited regular diet.  Objective: Vital signs in last 24 hours: Temp:  [97.7 F (36.5 C)-98 F (36.7 C)] 97.7 F (36.5 C) (09/08 2120) Pulse Rate:  [86-94] 94 (09/08 2120) Resp:  [16] 16 (09/08 2120) BP: (111-122)/(65-75) 111/65 (09/08 2120) SpO2:  [93 %-97 %] 93 % (09/08 2120) Last BM Date: 08/21/16  Intake/Output from previous day: 09/08 0701 - 09/09 0700 In: 1677 [P.O.:477; I.V.:1200] Out: -  Intake/Output this shift: Total I/O In: 120 [P.O.:120] Out: -   Physical Exam: HEENT - sclerae clear, mucous membranes moist Neck - soft Chest - clear bilaterally Cor - RRR Abdomen - mild distension; active BS present; wounds dry and intact Ext - no edema, non-tender Neuro - alert & oriented, no focal deficits  Lab Results:   Recent Labs  08/24/16 0727  WBC 8.2  HGB 9.3*  HCT 29.3*  PLT 349   BMET  Recent Labs  08/24/16 0727  NA 142  K 4.3  CL 105  CO2 31  GLUCOSE 89  BUN 8  CREATININE 0.76  CALCIUM 9.0   PT/INR No results for input(s): LABPROT, INR in the last 72 hours. Comprehensive Metabolic Panel:    Component Value Date/Time   NA 142 08/24/2016 0727   NA 141 08/22/2016 0447   NA 144 06/28/2016 1457   NA 148 (H) 05/31/2016 1430   NA 140 02/17/2014 1843   NA 141 01/18/2014 0632   K 4.3 08/24/2016 0727   K 4.0 08/22/2016 0447   K 2.9 (L) 02/17/2014 1843   K 4.0 01/18/2014 0632   CL 105 08/24/2016 0727   CL 108 08/22/2016 0447   CL 106 02/17/2014 1843   CL 113 (H) 01/18/2014 0632   CO2 31 08/24/2016 0727   CO2 26 08/22/2016 0447   CO2 29 02/17/2014 1843   CO2 25 01/18/2014 0632   BUN 8 08/24/2016 0727   BUN 11 08/22/2016 0447   BUN 14 06/28/2016 1457   BUN 19 05/31/2016 1430   BUN 13 02/17/2014 1843   BUN 11 01/18/2014 0632   CREATININE 0.76 08/24/2016 0727   CREATININE 0.71 08/22/2016 0447   CREATININE 0.87 02/17/2014 1843   CREATININE 0.78 01/18/2014 0632   GLUCOSE 89 08/24/2016 0727   GLUCOSE 91 08/22/2016 0447   GLUCOSE 95 02/17/2014 1843   GLUCOSE 88 01/18/2014 0632   CALCIUM 9.0 08/24/2016 0727   CALCIUM 9.1 08/22/2016 0447   CALCIUM 9.4 02/17/2014 1843   CALCIUM 9.0 01/18/2014 0632   AST 18 06/28/2016 1457   AST 18 05/31/2016 1430   AST 20 01/17/2014 1945   ALT 12 06/28/2016 1457   ALT 11 05/31/2016 1430   ALT 21 01/17/2014 1945   ALKPHOS 120 (H) 06/28/2016 1457   ALKPHOS 121 (H) 05/31/2016 1430   ALKPHOS 116 01/17/2014 1945   BILITOT <0.2 06/28/2016 1457   BILITOT <0.2 05/31/2016 1430   BILITOT 0.1 (L) 01/17/2014 1945   PROT 7.5 06/28/2016 1457   PROT 8.2 05/31/2016 1430   PROT 7.5 01/17/2014 1945   ALBUMIN 4.2 06/28/2016 1457   ALBUMIN 4.8 05/31/2016 1430   ALBUMIN 3.5 01/17/2014 1945    Studies/Results: No results found.  Assessment & Plans: Status post lap lysis of adhesions, ventral hernia repair with mesh  Encouraged  OOB, ambulation  Regular diet  PO pain Rx  Hopefully home tomorrow  Earnstine Regal, MD, [facility] Office: [place] 08/25/2016

## 2016-08-26 LAB — GLUCOSE, CAPILLARY: Glucose-Capillary: 84 mg/dL (ref 65–99)

## 2016-08-26 MED ORDER — HYDROCODONE-ACETAMINOPHEN 5-325 MG PO TABS: 1.0000 | ORAL_TABLET | ORAL | 0 refills | Status: DC | PRN

## 2016-08-26 NOTE — Progress Notes (Signed)
Patient alert and oriented with pain controlled. Patient given discharge instructions and prescriptions. Patient verbalized importance of follow up appointments and home care. All questions and concerns answered.

## 2016-08-26 NOTE — Progress Notes (Signed)
Patient ID: Julie Acevedo, female   DOB: July 28, 1951, 65 y.o.   MRN: [id number]  [facility]  Subjective: POD#5  Patient allows that she feels better today.  Taking some PO diet.  Voiding.  Pain improved.  Ambulated a little.  Objective: Vital signs in last 24 hours: Temp:  [98.4 F (36.9 C)-98.6 F (37 C)] 98.4 F (36.9 C) (09/10 0517) Pulse Rate:  [88-95] 92 (09/10 0517) Resp:  [15-16] 16 (09/10 0517) BP: (97-118)/(55-74) 109/55 (09/10 0517) SpO2:  [93 %-98 %] 93 % (09/10 0517) Last BM Date: 08/21/16  Intake/Output from previous day: 09/09 0701 - 09/10 0700 In: 1281.7 [P.O.:360; I.V.:921.7] Out: 250 [Urine:250] Intake/Output this shift: No intake/output data recorded.  Physical Exam: HEENT - sclerae clear, mucous membranes moist Neck - soft Abdomen - soft without distension; wounds dry and intact; binder replaced Ext - no edema, non-tender Neuro - alert & oriented, no focal deficits  Lab Results:   Recent Labs  08/24/16 0727  WBC 8.2  HGB 9.3*  HCT 29.3*  PLT 349   BMET  Recent Labs  08/24/16 0727  NA 142  K 4.3  CL 105  CO2 31  GLUCOSE 89  BUN 8  CREATININE 0.76  CALCIUM 9.0   PT/INR No results for input(s): LABPROT, INR in the last 72 hours. Comprehensive Metabolic Panel:    Component Value Date/Time   NA 142 08/24/2016 0727   NA 141 08/22/2016 0447   NA 144 06/28/2016 1457   NA 148 (H) 05/31/2016 1430   NA 140 02/17/2014 1843   NA 141 01/18/2014 0632   K 4.3 08/24/2016 0727   K 4.0 08/22/2016 0447   K 2.9 (L) 02/17/2014 1843   K 4.0 01/18/2014 0632   CL 105 08/24/2016 0727   CL 108 08/22/2016 0447   CL 106 02/17/2014 1843   CL 113 (H) 01/18/2014 0632   CO2 31 08/24/2016 0727   CO2 26 08/22/2016 0447   CO2 29 02/17/2014 1843   CO2 25 01/18/2014 0632   BUN 8 08/24/2016 0727   BUN 11 08/22/2016 0447   BUN 14 06/28/2016 1457   BUN 19 05/31/2016 1430   BUN 13 02/17/2014 1843   BUN 11 01/18/2014 0632    CREATININE 0.76 08/24/2016 0727   CREATININE 0.71 08/22/2016 0447   CREATININE 0.87 02/17/2014 1843   CREATININE 0.78 01/18/2014 0632   GLUCOSE 89 08/24/2016 0727   GLUCOSE 91 08/22/2016 0447   GLUCOSE 95 02/17/2014 1843   GLUCOSE 88 01/18/2014 0632   CALCIUM 9.0 08/24/2016 0727   CALCIUM 9.1 08/22/2016 0447   CALCIUM 9.4 02/17/2014 1843   CALCIUM 9.0 01/18/2014 0632   AST 18 06/28/2016 1457   AST 18 05/31/2016 1430   AST 20 01/17/2014 1945   ALT 12 06/28/2016 1457   ALT 11 05/31/2016 1430   ALT 21 01/17/2014 1945   ALKPHOS 120 (H) 06/28/2016 1457   ALKPHOS 121 (H) 05/31/2016 1430   ALKPHOS 116 01/17/2014 1945   BILITOT <0.2 06/28/2016 1457   BILITOT <0.2 05/31/2016 1430   BILITOT 0.1 (L) 01/17/2014 1945   PROT 7.5 06/28/2016 1457   PROT 8.2 05/31/2016 1430   PROT 7.5 01/17/2014 1945   ALBUMIN 4.2 06/28/2016 1457   ALBUMIN 4.8 05/31/2016 1430   ALBUMIN 3.5 01/17/2014 1945    Studies/Results: No results found.  Assessment & Plans: Status post lap lysis of adhesions, ventral hernia repair with mesh  Doing better this AM -  wants to go home  Percocet causes nausea - wants hydrocodone for pain  Discharge home today  Follow up at [place] office with Dr. Excell Seltzer to be arranged  Earnstine Regal, MD, [facility] Office: [place] 08/26/2016

## 2016-08-28 ENCOUNTER — Encounter (HOSPITAL_COMMUNITY): Payer: Self-pay | Admitting: General Surgery

## 2016-08-29 ENCOUNTER — Encounter: Payer: Self-pay | Admitting: Emergency Medicine

## 2016-08-29 ENCOUNTER — Emergency Department
Admission: EM | Admit: 2016-08-29 | Discharge: 2016-08-30 | Disposition: A | Discharge status: Home / Self Care | Payer: Medicare PPO | Attending: Student in an Organized Health Care Education/Training Program | Admitting: Student in an Organized Health Care Education/Training Program

## 2016-08-29 DIAGNOSIS — M961 Postlaminectomy syndrome, not elsewhere classified: Secondary | ICD-10-CM

## 2016-08-29 DIAGNOSIS — F4321 Adjustment disorder with depressed mood: Secondary | ICD-10-CM | POA: Insufficient documentation

## 2016-08-29 DIAGNOSIS — F4323 Adjustment disorder with mixed anxiety and depressed mood: Secondary | ICD-10-CM | POA: Diagnosis present

## 2016-08-29 DIAGNOSIS — Z79899 Other long term (current) drug therapy: Secondary | ICD-10-CM | POA: Insufficient documentation

## 2016-08-29 DIAGNOSIS — E119 Type 2 diabetes mellitus without complications: Secondary | ICD-10-CM

## 2016-08-29 DIAGNOSIS — T1491 Suicide attempt: Secondary | ICD-10-CM | POA: Diagnosis present

## 2016-08-29 DIAGNOSIS — Z7984 Long term (current) use of oral hypoglycemic drugs: Secondary | ICD-10-CM | POA: Diagnosis not present

## 2016-08-29 DIAGNOSIS — G8918 Other acute postprocedural pain: Secondary | ICD-10-CM

## 2016-08-29 DIAGNOSIS — I10 Essential (primary) hypertension: Secondary | ICD-10-CM | POA: Insufficient documentation

## 2016-08-29 DIAGNOSIS — T383X2A Poisoning by insulin and oral hypoglycemic [antidiabetic] drugs, intentional self-harm, initial encounter: Secondary | ICD-10-CM | POA: Insufficient documentation

## 2016-08-29 LAB — COMPREHENSIVE METABOLIC PANEL
ALT: 23 U/L (ref 14–54)
AST: 24 U/L (ref 15–41)
Albumin: 3.9 g/dL (ref 3.5–5.0)
Alkaline Phosphatase: 146 U/L — ABNORMAL HIGH (ref 38–126)
Anion gap: 9 (ref 5–15)
BUN: 14 mg/dL (ref 6–20)
CO2: 29 mmol/L (ref 22–32)
Calcium: 9.5 mg/dL (ref 8.9–10.3)
Chloride: 102 mmol/L (ref 101–111)
Creatinine, Ser: 0.72 mg/dL (ref 0.44–1.00)
GFR calc Af Amer: >60 mL/min (ref >60)
GFR calc non Af Amer: >60 mL/min (ref >60)
Glucose, Bld: 140 mg/dL — ABNORMAL HIGH (ref 65–99)
Potassium: 3.6 mmol/L (ref 3.5–5.1)
Sodium: 140 mmol/L (ref 135–145)
Total Bilirubin: 0.3 mg/dL (ref 0.3–1.2)
Total Protein: 8 g/dL (ref 6.5–8.1)

## 2016-08-29 LAB — CBC WITH DIFFERENTIAL/PLATELET
Basophils Absolute: 0 10*3/uL (ref 0–0.1)
Basophils Relative: 0 %
Eosinophils Absolute: 0.2 10*3/uL (ref 0–0.7)
Eosinophils Relative: 1 %
HCT: 33.6 % — ABNORMAL LOW (ref 35.0–47.0)
Hemoglobin: 11.1 g/dL — ABNORMAL LOW (ref 12.0–16.0)
Lymphocytes Relative: 20 %
Lymphs Abs: 2.2 10*3/uL (ref 1.0–3.6)
MCH: 26.8 pg (ref 26.0–34.0)
MCHC: 32.9 g/dL (ref 32.0–36.0)
MCV: 81.3 fL (ref 80.0–100.0)
Monocytes Absolute: 0.6 10*3/uL (ref 0.2–0.9)
Monocytes Relative: 6 %
Neutro Abs: 8.1 10*3/uL — ABNORMAL HIGH (ref 1.4–6.5)
Neutrophils Relative %: 73 %
Platelets: 483 10*3/uL — ABNORMAL HIGH (ref 150–440)
RBC: 4.13 MIL/uL (ref 3.80–5.20)
RDW: 16.1 % — ABNORMAL HIGH (ref 11.5–14.5)
WBC: 11.1 10*3/uL — ABNORMAL HIGH (ref 3.6–11.0)

## 2016-08-29 LAB — BLOOD GAS, VENOUS
Acid-Base Excess: 10.9 mmol/L — ABNORMAL HIGH (ref 0.0–2.0)
Bicarbonate: 36.7 mmol/L — ABNORMAL HIGH (ref 20.0–28.0)
Patient temperature: 37
pCO2, Ven: 54 mmHg (ref 44.0–60.0)
pH, Ven: 7.44 — ABNORMAL HIGH (ref 7.250–7.430)
pO2, Ven: <31 mmHg — CL (ref 32.0–45.0)

## 2016-08-29 LAB — URINE DRUG SCREEN, QUALITATIVE (ARMC ONLY)
Amphetamines, Ur Screen: NOT DETECTED
Barbiturates, Ur Screen: NOT DETECTED
Benzodiazepine, Ur Scrn: POSITIVE — AB
Cannabinoid 50 Ng, Ur ~~LOC~~: NOT DETECTED
Cocaine Metabolite,Ur ~~LOC~~: NOT DETECTED
MDMA (Ecstasy)Ur Screen: NOT DETECTED
Methadone Scn, Ur: NOT DETECTED
Opiate, Ur Screen: POSITIVE — AB
Phencyclidine (PCP) Ur S: NOT DETECTED
Tricyclic, Ur Screen: NOT DETECTED

## 2016-08-29 LAB — SALICYLATE LEVEL: Salicylate Lvl: <4 mg/dL (ref 2.8–30.0)

## 2016-08-29 LAB — GLUCOSE, CAPILLARY
Glucose-Capillary: 111 mg/dL — ABNORMAL HIGH (ref 65–99)
Glucose-Capillary: 103 mg/dL — ABNORMAL HIGH (ref 65–99)

## 2016-08-29 LAB — LACTIC ACID, PLASMA: Lactic Acid, Venous: 0.8 mmol/L (ref 0.5–1.9)

## 2016-08-29 LAB — ACETAMINOPHEN LEVEL: Acetaminophen (Tylenol), Serum: <10 ug/mL — ABNORMAL LOW (ref 10–30)

## 2016-08-29 NOTE — ED Notes (Signed)
Pt's belonging were taken and searched and pt placed in psych paper scrubs. All belongings removed except abd binder which Dr. Quentin Cornwall stated needed to stay in place.

## 2016-08-29 NOTE — ED Provider Notes (Signed)
[facility] Emergency Department Provider Note    First MD Initiated Contact with Patient 08/29/16 1927     (approximate)  I have reviewed the triage vital signs and the nursing notes.   HISTORY  Chief Complaint No chief complaint on file.    HPI Julie Acevedo is a 65 y.o. female who presents after intentional overdose of metformin after getting an argument with her husband. Patient reportedly became upset after her husband started calling her names. Denies any previous history of suicidal ideation or attempts. Denies any history of depression however review of her chart does show that she has a history of adjustment disorder with anxiety and depression. States that she took 5 500 mg metformin. She denies any abdominal pain, nausea or vomiting.   Past Medical History:  Diagnosis Date  . Arthritis   . Diverticulitis   . DM (diabetes mellitus) ([place])   . HTN (hypertension)   . Memory difficulties 09/01/2015  . Short-term memory loss     Patient Active Problem List   Diagnosis Date Noted  . Ventral incisional hernia 08/21/2016  . SBO (small bowel obstruction) ([place])   . Partial small bowel obstruction ([place]) 04/23/2016  . Adjustment disorder with mixed anxiety and depressed mood 04/23/2016  . Memory difficulties 09/01/2015  . Arthritis 06/11/2015  . Back pain, chronic 06/11/2015  . HLD (hyperlipidemia) 06/11/2015  . Cannot sleep 06/11/2015  . L-S radiculopathy 06/11/2015  . Arthritis of knee, degenerative 06/11/2015  . B12 deficiency 06/11/2015  . 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 DM2 (diabetes mellitus, type 2) ([place]) 11/15/2012  . DM (diabetes mellitus) ([place]) 01/27/2012  . HTN (hypertension) 01/27/2012  . Adiposity 11/21/2009  . Avitaminosis D 11/07/2009  . Narrowing of intervertebral disc space 07/25/2009  . Diabetes ([place]) 07/25/2009  . Benign essential HTN 07/25/2009     Past Surgical History:  Procedure Laterality Date  . ABDOMINAL HYSTERECTOMY     due to endometriosis-1 ovary left  . BREAST EXCISIONAL BIOPSY Right 1971   neg  . CARDIAC CATHETERIZATION  2000   no significant CAD per note of Dr Caryl Comes in 2013   . CHOLECYSTECTOMY    . EYE SURGERY    . FOOT SURGERY    . HAND SURGERY    . HEMICOLECTOMY    . INSERTION OF MESH N/A 08/21/2016   Procedure: INSERTION OF MESH;  Surgeon: Excell Seltzer, MD;  Location: [facility] OR[facility];  Service: General;  Laterality: N/A;  . LAPAROSCOPIC LYSIS OF ADHESIONS N/A 08/21/2016   Procedure: LAPAROSCOPIC LYSIS OF ADHESIONS;  Surgeon: Excell Seltzer, MD;  Location: [facility];  Service: General;  Laterality: N/A;  . LUMBAR [place]    . ROUX-EN-Y GASTRIC BYPASS    . TONSILLECTOMY    . UPPER GI ENDOSCOPY  01/12/16   normal larynx, normal esophagus. gastric bypass with a normal-sized pouch and intact staple line, normal examined jejunum, otherwise exam was normal  . VENTRAL HERNIA REPAIR N/A 08/21/2016   Procedure: LAPAROSCOPIC VENTRAL HERNIA;  Surgeon: Excell Seltzer, MD;  Location: [facility];  Service: General;  Laterality: N/A;    Prior to Admission medications   Medication Sig Start Date End Date Taking? Authorizing Provider  amLODipine (NORVASC) 10 MG tablet Take 1 tablet (10 mg total) by mouth daily. 11/03/15   Richard Maceo Pro., MD  diazepam (VALIUM) 5 MG tablet TAKE 1 TABLET BY MOUTH EVERY 6 HOURS AS NEEDED FOR ANXIETY  CAN TAKE 1/2-1 TABLET 07/24/16   Richard Maceo Pro., MD  gabapentin (NEURONTIN) 100 MG capsule Take 1 capsule (100 mg total) by mouth 3 (three) times daily. 02/21/16   Richard Maceo Pro., MD  hydrochlorothiazide (HYDRODIURIL) 25 MG tablet Take 25 mg by mouth daily.  05/30/16   Historical Provider, MD  HYDROcodone-acetaminophen (NORCO/VICODIN) 5-325 MG tablet Take 1-2 tablets by mouth every 4 (four) hours as needed for moderate pain. 08/26/16   Armandina Gemma, MD  linaclotide [facility]) 290 MCG CAPS  capsule Take 1 capsule (290 mcg total) by mouth daily before breakfast. 06/07/16   Jerrol Banana., MD  Melatonin 10 MG TABS Take 10 mg by mouth at bedtime.    Historical Provider, MD  metFORMIN (GLUCOPHAGE) 500 MG tablet Take 1 tablet (500 mg total) by mouth 2 (two) times daily with a meal. 11/03/15   Jerrol Banana., MD  oxyCODONE-acetaminophen (PERCOCET/ROXICET) 5-325 MG tablet Take 1-2 tablets by mouth every 4 (four) hours as needed for moderate pain. 08/24/16   Excell Seltzer, MD    Allergies Lotrel [amlodipine besy-benazepril hcl]; Contrast media [iodinated diagnostic agents]; Niacin; and Sulfa antibiotics  Family History  Problem Relation Age of Onset  . Cancer Father 10    Lung  . Coronary artery disease Father 64  . COPD Mother   . Diabetes Mother   . Hypertension Mother   . Cancer Paternal Grandmother     Breast  . Breast cancer Paternal Grandmother   . Congestive Heart Failure Maternal Grandmother   . Emphysema Maternal Grandfather   . Heart attack Paternal Grandfather   . Cancer Paternal Aunt     Breast  . Breast cancer Paternal Aunt 24  . Breast cancer Paternal Aunt 23    Social History Social History  Substance Use Topics  . Smoking status: Never Smoker  . Smokeless tobacco: Never Used  . Alcohol use No    Review of Systems Patient denies headaches, rhinorrhea, blurry vision, numbness, shortness of breath, chest pain, edema, cough, abdominal pain, nausea, vomiting, diarrhea, dysuria, fevers, rashes or hallucinations unless otherwise stated above in HPI. ____________________________________________   PHYSICAL EXAM:  VITAL SIGNS: Vitals:   08/29/16 2153 08/29/16 2300  BP: 119/69 117/74  Pulse: 71 62  Resp: 16 16  Temp:      Constitutional: Alert and oriented. Well appearing and in no acute distress. Eyes: Conjunctivae are normal. PERRL. EOMI. Head: Atraumatic. Nose: No congestion/rhinnorhea. Mouth/Throat: Mucous membranes are moist.   Oropharynx non-erythematous. Neck: No stridor. Painless ROM. No cervical spine tenderness to palpation Hematological/Lymphatic/Immunilogical: No cervical lymphadenopathy. Cardiovascular: Normal rate, regular rhythm. Grossly normal heart sounds.  Good peripheral circulation. Respiratory: Normal respiratory effort.  No retractions. Lungs CTAB. Gastrointestinal: Soft and nontender. No distention. No abdominal bruits. No CVA tenderness. Genitourinary:  Musculoskeletal: No lower extremity tenderness nor edema.  No joint effusions. Neurologic:  Normal speech and language. No gross focal neurologic deficits are appreciated. No gait instability. Skin:  Skin is warm, dry and intact. No rash noted. Psychiatric: Mood and affect are normal. Speech and behavior are normal.  ____________________________________________   LABS (all labs ordered are listed, but only abnormal results are displayed)  Results for orders placed or performed during the hospital encounter of 08/29/16 (from the past 24 hour(s))  Urine Drug Screen, Qualitative ([place] only)     Status: Abnormal   Collection Time: 08/29/16  7:29 PM  Result Value Ref Range   Tricyclic, Ur Screen NONE DETECTED NONE DETECTED  Amphetamines, Ur Screen NONE DETECTED NONE DETECTED   MDMA (Ecstasy)Ur Screen NONE DETECTED NONE DETECTED   Cocaine Metabolite,Ur [place] NONE DETECTED NONE DETECTED   Opiate, Ur Screen POSITIVE (A) NONE DETECTED   Phencyclidine (PCP) Ur S NONE DETECTED NONE DETECTED   Cannabinoid 50 Ng, Ur [place] NONE DETECTED NONE DETECTED   Barbiturates, Ur Screen NONE DETECTED NONE DETECTED   Benzodiazepine, Ur Scrn POSITIVE (A) NONE DETECTED   Methadone Scn, Ur NONE DETECTED NONE DETECTED  Blood gas, venous     Status: Abnormal   Collection Time: 08/29/16  7:29 PM  Result Value Ref Range   pH, Ven 7.44 (H) 7.250 - 7.430   pCO2, Ven 54 44.0 - 60.0 mmHg   pO2, Ven <31.0 (LL) 32.0 - 45.0 mmHg   Bicarbonate 36.7 (H) 20.0 - 28.0 mmol/L    Acid-Base Excess 10.9 (H) 0.0 - 2.0 mmol/L   Patient temperature 37.0    Collection site LEFT ANTECUBITAL    Sample type VENOUS   CBC with Differential/Platelet     Status: Abnormal   Collection Time: 08/29/16  7:31 PM  Result Value Ref Range   WBC 11.1 (H) 3.6 - 11.0 K/uL   RBC 4.13 3.80 - 5.20 MIL/uL   Hemoglobin 11.1 (L) 12.0 - 16.0 g/dL   HCT 33.6 (L) 35.0 - 47.0 %   MCV 81.3 80.0 - 100.0 fL   MCH 26.8 26.0 - 34.0 pg   MCHC 32.9 32.0 - 36.0 g/dL   RDW 16.1 (H) 11.5 - 14.5 %   Platelets 483 (H) 150 - 440 K/uL   Neutrophils Relative % 73 %   Neutro Abs 8.1 (H) 1.4 - 6.5 K/uL   Lymphocytes Relative 20 %   Lymphs Abs 2.2 1.0 - 3.6 K/uL   Monocytes Relative 6 %   Monocytes Absolute 0.6 0.2 - 0.9 K/uL   Eosinophils Relative 1 %   Eosinophils Absolute 0.2 0 - 0.7 K/uL   Basophils Relative 0 %   Basophils Absolute 0.0 0 - 0.1 K/uL  Comprehensive metabolic panel     Status: Abnormal   Collection Time: 08/29/16  7:31 PM  Result Value Ref Range   Sodium 140 135 - 145 mmol/L   Potassium 3.6 3.5 - 5.1 mmol/L   Chloride 102 101 - 111 mmol/L   CO2 29 22 - 32 mmol/L   Glucose, Bld 140 (H) 65 - 99 mg/dL   BUN 14 6 - 20 mg/dL   Creatinine, Ser 0.72 0.44 - 1.00 mg/dL   Calcium 9.5 8.9 - 10.3 mg/dL   Total Protein 8.0 6.5 - 8.1 g/dL   Albumin 3.9 3.5 - 5.0 g/dL   AST 24 15 - 41 U/L   ALT 23 14 - 54 U/L   Alkaline Phosphatase 146 (H) 38 - 126 U/L   Total Bilirubin 0.3 0.3 - 1.2 mg/dL   GFR calc non Af Amer >60 >60 mL/min   GFR calc Af Amer >60 >60 mL/min   Anion gap 9 5 - 15  Acetaminophen level     Status: Abnormal   Collection Time: 08/29/16  7:31 PM  Result Value Ref Range   Acetaminophen (Tylenol), Serum <10 (L) 10 - 30 ug/mL  Salicylate level     Status: None   Collection Time: 08/29/16  7:31 PM  Result Value Ref Range   Salicylate Lvl 123456 2.8 - 30.0 mg/dL  Lactic acid, plasma     Status: None   Collection Time: 08/29/16  7:31 PM  Result Value Ref Range   Lactic Acid,  Venous 0.8 0.5 - 1.9 mmol/L  Glucose, capillary     Status: Abnormal   Collection Time: 08/29/16  7:52 PM  Result Value Ref Range   Glucose-Capillary 111 (H) 65 - 99 mg/dL  Glucose, capillary     Status: Abnormal   Collection Time: 08/29/16  9:06 PM  Result Value Ref Range   Glucose-Capillary 103 (H) 65 - 99 mg/dL   ____________________________________________  EKG My review and personal interpretation at Time: 19:41   Indication: overdose  Rate: 80  Rhythm: nsr Axis: normal Other: normal intervals, no acute ischemia ____________________________________________  RADIOLOGY   ____________________________________________   PROCEDURES  Procedure(s) performed: none    Critical Care performed: no ____________________________________________   INITIAL IMPRESSION / ASSESSMENT AND PLAN / ED COURSE  Pertinent labs & imaging results that were available during my care of the patient were reviewed by me and considered in my medical decision making (see chart for details).  DDX: Psychosis, delirium, medication effect, noncompliance, polysubstance abuse, Si, Hi, depression   KENESHIA LIVINGOOD is a 65 y.o. who presents to the ED with for evaluation of intentional overdose.  Patient has psych history of depression.  Patient with overdose of metformin. No evidence of acute lactic acidosis and patient otherwise asymptomatic. Laboratory testing was ordered to evaluation for underlying electrolyte derangement or signs of underlying organic pathology to explain today's presentation.  Patient made an IVC due to intentional overdose.  The patient will be placed on continuous pulse oximetry and telemetry for monitoring.  Laboratory evaluation will be sent to evaluate for the above complaints.      Clinical Course  Value Comment By Time  Glucose-Capillary: (!) 111 (Reviewed) Merlyn Lot, MD 09/13 2039  pH, Ven: (!) 7.44 (Reviewed) Merlyn Lot, MD 09/13 2039  CO2: 29 (Reviewed) Merlyn Lot, MD 09/13 2039   Lactate is normal. Tylenol normal no significant hypoglycemia or acidosis. [place] was consult and recommend monitoring for symptoms for 8 hours. We'll repeat Tylenol and lactic acid and 4 hours. She is currently asymptomatic and hemodynamically stable. Merlyn Lot, MD 09/13 2136   ----------------------------------------- 11:20 PM on 08/29/2016 -----------------------------------------  Patient will be signed out to Dr. Beather Arbour pending repeat Tylenol and lactic acid levels.  ____________________________________________   FINAL CLINICAL IMPRESSION(S) / ED DIAGNOSES  Final diagnoses:  Intentional drug overdose, initial encounter ([place])  Suicide attempt ([place])      NEW MEDICATIONS STARTED DURING THIS VISIT:  New Prescriptions   No medications on file     Note:  This document was prepared using Dragon voice recognition software and may include unintentional dictation errors.    Merlyn Lot, MD 08/29/16 [phone]

## 2016-08-29 NOTE — ED Triage Notes (Signed)
Pt states her husband got angry at her for pulling some money out to pay bills and he called her all sorts of names so she took 5 500mg  metformin. She denies wanting to herself.

## 2016-08-30 DIAGNOSIS — F4323 Adjustment disorder with mixed anxiety and depressed mood: Secondary | ICD-10-CM | POA: Diagnosis not present

## 2016-08-30 DIAGNOSIS — G8918 Other acute postprocedural pain: Secondary | ICD-10-CM

## 2016-08-30 DIAGNOSIS — M961 Postlaminectomy syndrome, not elsewhere classified: Secondary | ICD-10-CM

## 2016-08-30 LAB — GLUCOSE, CAPILLARY
Glucose-Capillary: 86 mg/dL (ref 65–99)
Glucose-Capillary: 90 mg/dL (ref 65–99)
Glucose-Capillary: 108 mg/dL — ABNORMAL HIGH (ref 65–99)

## 2016-08-30 LAB — ACETAMINOPHEN LEVEL: Acetaminophen (Tylenol), Serum: <10 ug/mL — ABNORMAL LOW (ref 10–30)

## 2016-08-30 NOTE — ED Notes (Signed)
Pt up using restroom without any assistance

## 2016-08-30 NOTE — ED Notes (Signed)
Pt refuses lunch tray, states she is not hungry.

## 2016-08-30 NOTE — ED Notes (Signed)
Pt given dinner tray. Pt ask me to place tray on counter. Did not want at this time.

## 2016-08-30 NOTE — ED Notes (Signed)
Report given to Pricilla Loveless, RN.

## 2016-08-30 NOTE — ED Notes (Signed)
This RN introduced self to patient. Pt asking questions regarding shower, this RN explained to patient that showers were usually done after breakfast which would be here at approx 0830. Pt is noted to be calm and cooperative at this time. NAD noted. Deniece Ree, EDT at bedside getting vitals at this time. Pt denies any further needs at this time, will continue to monitor for further patient needs.

## 2016-08-30 NOTE — BH Assessment (Signed)
Assessment Note  Julie Acevedo is an 65 y.o. female presenting to the ED after intentional overdose of metformin.  Pt and her husband were reportedly arguing when he started calling the patient names.  She states that she became upset and decided to take 5 500 mg metformin.  Pt denies SI.     TTS counselor spoke with patient's daughters who report pt has a history of depression and is starting to show early signs of dementia.  Pt's daughter also stated that patient and their stepfather do not get along and are constantly arguing with each other.  They deny that patient has ever had any psychiatric hospitalizations.   Diagnosis: Major Depression  Past Medical History:  Past Medical History:  Diagnosis Date  . Arthritis   . Diverticulitis   . DM (diabetes mellitus) ([place])   . HTN (hypertension)   . Memory difficulties 09/01/2015  . Short-term memory loss     Past Surgical History:  Procedure Laterality Date  . ABDOMINAL HYSTERECTOMY     due to endometriosis-1 ovary left  . BREAST EXCISIONAL BIOPSY Right 1971   neg  . CARDIAC CATHETERIZATION  2000   no significant CAD per note of Dr Caryl Comes in 2013   . CHOLECYSTECTOMY    . EYE SURGERY    . FOOT SURGERY    . HAND SURGERY    . HEMICOLECTOMY    . INSERTION OF MESH N/A 08/21/2016   Procedure: INSERTION OF MESH;  Surgeon: Excell Seltzer, MD;  [facility];  Service: General;  Laterality: N/A;  . LAPAROSCOPIC LYSIS OF ADHESIONS N/A 08/21/2016   Procedure: LAPAROSCOPIC LYSIS OF ADHESIONS;  Surgeon: Excell Seltzer, MD;  [facility];  Service: General;  Laterality: N/A;  . LUMBAR [place]    . ROUX-EN-Y GASTRIC BYPASS    . TONSILLECTOMY    . UPPER GI ENDOSCOPY  01/12/16   normal larynx, normal esophagus. gastric bypass with a normal-sized pouch and intact staple line, normal examined jejunum, otherwise exam was normal  . VENTRAL HERNIA REPAIR N/A 08/21/2016   Procedure: LAPAROSCOPIC VENTRAL HERNIA;  Surgeon: Excell Seltzer,  MD;  [facility];  Service: General;  Laterality: N/A;    Family History:  Family History  Problem Relation Age of Onset  . Cancer Father 52    Lung  . Coronary artery disease Father 108  . COPD Mother   . Diabetes Mother   . Hypertension Mother   . Cancer Paternal Grandmother     Breast  . Breast cancer Paternal Grandmother   . Congestive Heart Failure Maternal Grandmother   . Emphysema Maternal Grandfather   . Heart attack Paternal Grandfather   . Cancer Paternal Aunt     Breast  . Breast cancer Paternal Aunt 56  . Breast cancer Paternal Aunt 40    Social History:  reports that she has never smoked. She has never used smokeless tobacco. She reports that she does not drink alcohol or use drugs.  Additional Social History:  Alcohol / Drug Use History of alcohol / drug use?: No history of alcohol / drug abuse  CIWA: CIWA-Ar BP: 117/74 Pulse Rate: 62 COWS:    Allergies:  Allergies  Allergen Reactions  . Lotrel [Amlodipine Besy-Benazepril Hcl] Anaphylaxis  . Contrast Media [Iodinated Diagnostic Agents] Swelling and Other (See Comments)    Reaction:  Neck swelling   . Niacin Hives  . Sulfa Antibiotics Hives    Home Medications:  (Not in a hospital admission)  OB/GYN Status:  No LMP recorded. Patient has had a hysterectomy.  General Assessment Data Location of Assessment: [facility] ED TTS Assessment: In system Is this a Tele or Face-to-Face Assessment?: Face-to-Face Is this an Initial Assessment or a Re-assessment for this encounter?: Initial Assessment Marital status: Married [place] name: n/a Is patient pregnant?: No Pregnancy Status: No Living Arrangements: Spouse/significant other Can pt return to current living arrangement?: Yes Admission Status: Involuntary Is patient capable of signing voluntary admission?: Yes Referral Source: Self/Family/Friend Insurance type: Medicare  Medical Screening Exam ([place]) Medical Exam completed: Yes  Crisis  Care Plan Living Arrangements: Spouse/significant other Legal Guardian: Other: Name of Psychiatrist: n/a Name of Therapist: n/a  Education Status Is patient currently in school?: No Current Grade: n/a Highest grade of school patient has completed: n/a Name of school: n/a Contact person: n/a  Risk to self with the past 6 months Suicidal Ideation: Yes-Currently Present Has patient been a risk to self within the past 6 months prior to admission? : No Suicidal Intent: No Has patient had any suicidal intent within the past 6 months prior to admission? : No Is patient at risk for suicide?: No Suicidal Plan?: No Has patient had any suicidal plan within the past 6 months prior to admission? : No Access to Means: Yes Specify Access to Suicidal Means: Pt has access to prescription medications What has been your use of drugs/alcohol within the last 12 months?: none reported Previous Attempts/Gestures: No How many times?: 0 Other Self Harm Risks: none identified Triggers for Past Attempts: None known Intentional Self Injurious Behavior: None Family Suicide History: No Recent stressful life event(s): Conflict (Comment) (conflict with spouse) Persecutory voices/beliefs?: No Depression: Yes Depression Symptoms: Loss of interest in usual pleasures, Feeling angry/irritable Substance abuse history and/or treatment for substance abuse?: No Suicide prevention information given to non-admitted patients: Not applicable  Risk to Others within the past 6 months Homicidal Ideation: No Does patient have any lifetime risk of violence toward others beyond the six months prior to admission? : No Thoughts of Harm to Others: No Current Homicidal Intent: No Current Homicidal Plan: No Access to Homicidal Means: No Identified Victim: n/a History of harm to others?: No Assessment of Violence: None Noted Violent Behavior Description: n/a Does patient have access to weapons?: No Criminal Charges  Pending?: No Does patient have a court date: No Is patient on probation?: No  Psychosis Hallucinations: None noted Delusions: None noted  Mental Status Report Appearance/Hygiene: Unremarkable Eye Contact: Fair Motor Activity: Unremarkable Speech: Logical/coherent Level of Consciousness: Alert Mood: Depressed Affect: Appropriate to circumstance Anxiety Level: Minimal Thought Processes: Relevant Judgement: Partial Orientation: Person, Place, Time, Situation Obsessive Compulsive Thoughts/Behaviors: None  Cognitive Functioning Concentration: Good Memory: Recent Intact, Remote Intact IQ: Average Insight: Poor Impulse Control: Poor Appetite: Good Weight Loss: 0 Weight Gain: 0 Sleep: No Change Vegetative Symptoms: None  ADLScreening [facility] Assessment Services) Patient's cognitive ability adequate to safely complete daily activities?: Yes Patient able to express need for assistance with ADLs?: Yes Independently performs ADLs?: Yes (appropriate for developmental age)  Prior Inpatient Therapy Prior Inpatient Therapy: No Prior Therapy Dates: n/a Prior Therapy Facilty/Provider(s): n/a Reason for Treatment: n/a  Prior Outpatient Therapy Prior Outpatient Therapy: No Prior Therapy Dates: n/a Prior Therapy Facilty/Provider(s): n/a Reason for Treatment: n/a Does patient have an ACCT team?: No Does patient have Intensive In-House Services?  : No Does patient have [facility] services? : No Does patient have P4CC services?: No  ADL Screening (condition at time of  admission) Patient's cognitive ability adequate to safely complete daily activities?: Yes Patient able to express need for assistance with ADLs?: Yes Independently performs ADLs?: Yes (appropriate for developmental age)       Abuse/Neglect Assessment (Assessment to be complete while patient is alone) Physical Abuse: Denies Verbal Abuse: Denies Sexual Abuse: Denies Exploitation of patient/patient's resources:  Denies Self-Neglect: Denies Values / Beliefs Cultural Requests During Hospitalization: None Spiritual Requests During Hospitalization: None Consults Spiritual Care Consult Needed: No Social Work Consult Needed: No Regulatory affairs officer (For Healthcare) Does patient have an advance directive?: No    Additional Information 1:1 In Past 12 Months?: No CIRT Risk: No Elopement Risk: No Does patient have medical clearance?: No     Disposition:  Disposition Initial Assessment Completed for this Encounter: Yes Disposition of Patient: Other dispositions Other disposition(s): Other (Comment) (Pending Psych MD consult)  On Site Evaluation by:   Reviewed with Physician:    Laronda Lisby C Jeily Guthridge 08/30/2016 12:00 AM

## 2016-08-30 NOTE — ED Provider Notes (Signed)
-----------------------------------------   6:39 AM on 08/30/2016 -----------------------------------------   Blood pressure 110/70, pulse 84, temperature 98.2 F (36.8 C), temperature source Oral, resp. rate 18, height 5\' 3"  (1.6 m), weight 140 lb (63.5 kg), SpO2 98 %.  The patient had no acute events since last update.  Repeat acetaminophen level remains negative. Calm and cooperative at this time.  Disposition is pending Psychiatry/Behavioral Medicine team recommendations.     Paulette Blanch, MD 08/30/16 [phone]

## 2016-08-30 NOTE — Discharge Instructions (Signed)

## 2016-08-30 NOTE — ED Provider Notes (Signed)
-----------------------------------------   3:56 PM on 08/30/2016 -----------------------------------------   BP 122/75 (BP Location: Right Arm)   Pulse (!) 108   Temp 98.2 F (36.8 C) (Oral)   Resp 18   Ht 5\' 3"  (1.6 m)   Wt 63.5 kg   SpO2 99%   BMI 24.80 kg/m   I spoke in person with Dr. Weber Cooks who evaluated the patient and feels that the patient is safe to be discharged with outpatient follow-up.  The patient is hemodynamically stable and appropriate to go at this time. He is revoking the IVC.    Hinda Kehr, MD 08/30/16 [phone]

## 2016-08-30 NOTE — Consult Note (Signed)
[facility] Consult   Reason for Consult:  Consult for 65 year old woman brought to the hospital after taking an overdose of metformin Referring Physician:  Reita Cliche Patient Identification: Julie Acevedo MRN:  [id number] Principal Diagnosis: Adjustment disorder with mixed anxiety and depressed mood Diagnosis:   Patient Active Problem List   Diagnosis Date Noted  . Pain at surgical site [G89.18] 08/30/2016  . Ventral incisional hernia [K43.2] 08/21/2016  . SBO (small bowel obstruction) ([place]) [K56.69]   . Partial small bowel obstruction ([place]) [K56.69] 04/23/2016  . Adjustment disorder with mixed anxiety and depressed mood [F43.23] 04/23/2016  . Memory difficulties [R41.3] 09/01/2015  . Arthritis [M19.90] 06/11/2015  . Back pain, chronic [M54.9, G89.29] 06/11/2015  . HLD (hyperlipidemia) [E78.5] 06/11/2015  . Cannot sleep [G47.00] 06/11/2015  . L-S radiculopathy [M54.17] 06/11/2015  . Arthritis of knee, degenerative [M17.9] 06/11/2015  . B12 deficiency [E53.8] 06/11/2015  . Gastric bypass status for obesity [Z98.84] 05/06/2013  . 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 DM2 (diabetes mellitus, type 2) ([place]) [E11.9] 11/15/2012  . DM (diabetes mellitus) ([place]) [E11.9] 01/27/2012  . HTN (hypertension) [I10] 01/27/2012  . Adiposity [E66.9] 11/21/2009  . Avitaminosis D [E55.9] 11/07/2009  . Narrowing of intervertebral disc space [M99.79] 07/25/2009  . Diabetes ([place]) [E11.9] 07/25/2009  . Benign essential HTN [I10] 07/25/2009    Total Time spent with patient: 1 hour  Subjective:   Julie Acevedo is a 65 y.o. female patient admitted with "I took some pills".  HPI:  Patient interviewed. Chart reviewed. Labs and vitals reviewed. This is a 65 year old woman brought to the hospital after taking an overdose yesterday. Patient states that she and her husband were engaged in an argument yesterday afternoon. He lost his temper in a way that is unusual for him  and called her many vulgar and insulting names. She felt so overwhelmed emotionally and terrible that she took 4 or 5 metformin tablets and then left the house. Husband was aware of what she had done. Patient says it was an impulsive thing with no planning and that as soon as she did it she regretted it. She says that she was intending to go get medical assistance but knew that all it would do was make her blood sugar drop eventually. Patient is not currently on any psychiatric medicine or seeing anyone for mental health treatment. She is having significant abdominal pain ever since she had surgery recently which has been keeping her up at night. Appetite has been down. Mood is mostly down and discouraged but not depressed or hopeless. Mostly focused on her pain. No hallucinations or psychotic symptoms. Denies that she was drinking or using any drugs or even pain medicine yesterday.  Social history: Patient lives with her husband. As it turns out it sounds like her husband lost his job yesterday which could certainly account for why he was acting so angrily. She has 3 adult daughters 2 of whom live in the area whom she is close to. Her church is very important to her.  Medical history: Recently had surgery at [facility] for abdominal hernias. Sounds like it could be related to gastric bypass surgery she had years ago. Also has diabetes.  Substance abuse history: Denies any alcohol or drug abuse past or present  Past Psychiatric History: Reports that she saw a therapist many years ago when her parents died. Had never seen a psychiatrist. Never been on psychiatric medicine. Never been in a psychiatric hospital. No past  suicide attempts or violence.  Risk to Self: Suicidal Ideation: Yes-Currently Present Suicidal Intent: No Is patient at risk for suicide?: No Suicidal Plan?: No Access to Means: Yes Specify Access to Suicidal Means: Pt has access to prescription medications What has been your  use of drugs/alcohol within the last 12 months?: none reported How many times?: 0 Other Self Harm Risks: none identified Triggers for Past Attempts: None known Intentional Self Injurious Behavior: None Risk to Others: Homicidal Ideation: No Thoughts of Harm to Others: No Current Homicidal Intent: No Current Homicidal Plan: No Access to Homicidal Means: No Identified Victim: n/a History of harm to others?: No Assessment of Violence: None Noted Violent Behavior Description: n/a Does patient have access to weapons?: No Criminal Charges Pending?: No Does patient have a court date: No Prior Inpatient Therapy: Prior Inpatient Therapy: No Prior Therapy Dates: n/a Prior Therapy Facilty/Provider(s): n/a Reason for Treatment: n/a Prior Outpatient Therapy: Prior Outpatient Therapy: No Prior Therapy Dates: n/a Prior Therapy Facilty/Provider(s): n/a Reason for Treatment: n/a Does patient have an ACCT team?: No Does patient have Intensive In-House Services?  : No Does patient have [facility] services? : No Does patient have P4CC services?: No  Past Medical History:  Past Medical History:  Diagnosis Date  . Arthritis   . Diverticulitis   . DM (diabetes mellitus) ([place])   . HTN (hypertension)   . Memory difficulties 09/01/2015  . Short-term memory loss     Past Surgical History:  Procedure Laterality Date  . ABDOMINAL HYSTERECTOMY     due to endometriosis-1 ovary left  . BREAST EXCISIONAL BIOPSY Right 1971   neg  . CARDIAC CATHETERIZATION  2000   no significant CAD per note of Dr Caryl Comes in 2013   . CHOLECYSTECTOMY    . EYE SURGERY    . FOOT SURGERY    . HAND SURGERY    . HEMICOLECTOMY    . INSERTION OF MESH N/A 08/21/2016   Procedure: INSERTION OF MESH;  Surgeon: Excell Seltzer, MD;  Location: [facility];  Service: General;  Laterality: N/A;  . LAPAROSCOPIC LYSIS OF ADHESIONS N/A 08/21/2016   Procedure: LAPAROSCOPIC LYSIS OF ADHESIONS;  Surgeon: Excell Seltzer, MD;  Location: [facility];   Service: General;  Laterality: N/A;  . LUMBAR [place]    . ROUX-EN-Y GASTRIC BYPASS    . TONSILLECTOMY    . UPPER GI ENDOSCOPY  01/12/16   normal larynx, normal esophagus. gastric bypass with a normal-sized pouch and intact staple line, normal examined jejunum, otherwise exam was normal  . VENTRAL HERNIA REPAIR N/A 08/21/2016   Procedure: LAPAROSCOPIC VENTRAL HERNIA;  Surgeon: Excell Seltzer, MD;  Location: [facility];  Service: General;  Laterality: N/A;   Family History:  Family History  Problem Relation Age of Onset  . Cancer Father 28    Lung  . Coronary artery disease Father 88  . COPD Mother   . Diabetes Mother   . Hypertension Mother   . Cancer Paternal Grandmother     Breast  . Breast cancer Paternal Grandmother   . Congestive Heart Failure Maternal Grandmother   . Emphysema Maternal Grandfather   . Heart attack Paternal Grandfather   . Cancer Paternal Aunt     Breast  . Breast cancer Paternal Aunt 67  . Breast cancer Paternal Aunt 78   Family Psychiatric  History: She does not think there is any family history of mental illness Social History:  History  Alcohol Use No     History  Drug Use No    Social History   Social History  . Marital status: Married    Spouse name: N/A  . Number of children: 3  . Years of education: 14   Occupational History  . 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 Smoking status: Never Smoker  . Smokeless tobacco: Never Used  . Alcohol use No  . Drug use: No  . Sexual activity: Not Asked   Other Topics Concern  . None   Social History Narrative   Lives with husband, daughter and granddaughter.;   Patient drinks about 3 cups of caffeine daily.   Patient is left handed.    Additional Social History:    Allergies:   Allergies  Allergen Reactions  . Lotrel [Amlodipine Besy-Benazepril Hcl] Anaphylaxis  . Contrast Media [Iodinated Diagnostic Agents] Swelling and Other (See Comments)    Reaction:  Neck swelling    . Niacin Hives  . Sulfa Antibiotics Hives    Labs:  Results for orders placed or performed during the hospital encounter of 08/29/16 (from the past 48 hour(s))  Urine Drug Screen, Qualitative ([place] only)     Status: Abnormal   Collection Time: 08/29/16  7:29 PM  Result Value Ref Range   Tricyclic, Ur Screen NONE DETECTED NONE DETECTED   Amphetamines, Ur Screen NONE DETECTED NONE DETECTED   MDMA (Ecstasy)Ur Screen NONE DETECTED NONE DETECTED   Cocaine Metabolite,Ur [place] NONE DETECTED NONE DETECTED   Opiate, Ur Screen POSITIVE (A) NONE DETECTED   Phencyclidine (PCP) Ur S NONE DETECTED NONE DETECTED   Cannabinoid 50 Ng, Ur [place] NONE DETECTED NONE DETECTED   Barbiturates, Ur Screen NONE DETECTED NONE DETECTED   Benzodiazepine, Ur Scrn POSITIVE (A) NONE DETECTED   Methadone Scn, Ur NONE DETECTED NONE DETECTED    Comment: (NOTE) 209  Tricyclics, urine               Cutoff 1000 ng/mL 200  Amphetamines, urine             Cutoff 1000 ng/mL 300  MDMA (Ecstasy), urine           Cutoff 500 ng/mL 400  Cocaine Metabolite, urine       Cutoff 300 ng/mL 500  Opiate, urine                   Cutoff 300 ng/mL 600  Phencyclidine (PCP), urine      Cutoff 25 ng/mL 700  Cannabinoid, urine              Cutoff 50 ng/mL 800  Barbiturates, urine             Cutoff 200 ng/mL 900  Benzodiazepine, urine           Cutoff 200 ng/mL 1000 Methadone, urine                Cutoff 300 ng/mL 1100 1200 The urine drug screen provides only a preliminary, unconfirmed 1300 analytical test result and should not be used for non-medical 1400 purposes. Clinical consideration and professional judgment should 1500 be applied to any positive drug screen result due to possible 1600 interfering substances. A more specific alternate chemical method 1700 must be used in order to obtain a confirmed analytical result.  1800 Gas chromato graphy / mass spectrometry (GC/MS) is the preferred 1900 confirmatory method.   Blood gas, venous      Status: Abnormal   Collection Time: 08/29/16  7:29 PM  Result Value Ref Range   pH, Ven 7.44 (H) 7.250 - 7.430   pCO2, Ven 54 44.0 - 60.0 mmHg   pO2, Ven <31.0 (LL) 32.0 - 45.0 mmHg   Bicarbonate 36.7 (H) 20.0 - 28.0 mmol/L   Acid-Base Excess 10.9 (H) 0.0 - 2.0 mmol/L   Patient temperature 37.0    Collection site LEFT ANTECUBITAL    Sample type VENOUS   CBC with Differential/Platelet     Status: Abnormal   Collection Time: 08/29/16  7:31 PM  Result Value Ref Range   WBC 11.1 (H) 3.6 - 11.0 K/uL   RBC 4.13 3.80 - 5.20 MIL/uL   Hemoglobin 11.1 (L) 12.0 - 16.0 g/dL   HCT 33.6 (L) 35.0 - 47.0 %   MCV 81.3 80.0 - 100.0 fL   MCH 26.8 26.0 - 34.0 pg   MCHC 32.9 32.0 - 36.0 g/dL   RDW 16.1 (H) 11.5 - 14.5 %   Platelets 483 (H) 150 - 440 K/uL   Neutrophils Relative % 73 %   Neutro Abs 8.1 (H) 1.4 - 6.5 K/uL   Lymphocytes Relative 20 %   Lymphs Abs 2.2 1.0 - 3.6 K/uL   Monocytes Relative 6 %   Monocytes Absolute 0.6 0.2 - 0.9 K/uL   Eosinophils Relative 1 %   Eosinophils Absolute 0.2 0 - 0.7 K/uL   Basophils Relative 0 %   Basophils Absolute 0.0 0 - 0.1 K/uL  Comprehensive metabolic panel     Status: Abnormal   Collection Time: 08/29/16  7:31 PM  Result Value Ref Range   Sodium 140 135 - 145 mmol/L   Potassium 3.6 3.5 - 5.1 mmol/L   Chloride 102 101 - 111 mmol/L   CO2 29 22 - 32 mmol/L   Glucose, Bld 140 (H) 65 - 99 mg/dL   BUN 14 6 - 20 mg/dL   Creatinine, Ser 0.72 0.44 - 1.00 mg/dL   Calcium 9.5 8.9 - 10.3 mg/dL   Total Protein 8.0 6.5 - 8.1 g/dL   Albumin 3.9 3.5 - 5.0 g/dL   AST 24 15 - 41 U/L   ALT 23 14 - 54 U/L   Alkaline Phosphatase 146 (H) 38 - 126 U/L   Total Bilirubin 0.3 0.3 - 1.2 mg/dL   GFR calc non Af Amer >60 >60 mL/min   GFR calc Af Amer >60 >60 mL/min    Comment: (NOTE) The eGFR has been calculated using the CKD EPI equation. This calculation has not been validated in all clinical situations. eGFR's persistently <60 mL/min signify possible Chronic  Kidney Disease.    Anion gap 9 5 - 15  Acetaminophen level     Status: Abnormal   Collection Time: 08/29/16  7:31 PM  Result Value Ref Range   Acetaminophen (Tylenol), Serum <10 (L) 10 - 30 ug/mL    Comment:        THERAPEUTIC CONCENTRATIONS VARY SIGNIFICANTLY. A RANGE OF 10-30 ug/mL MAY BE AN EFFECTIVE CONCENTRATION FOR MANY PATIENTS. HOWEVER, SOME ARE BEST TREATED AT CONCENTRATIONS OUTSIDE THIS RANGE. ACETAMINOPHEN CONCENTRATIONS >150 ug/mL AT 4 HOURS AFTER INGESTION AND >50 ug/mL AT 12 HOURS AFTER INGESTION ARE OFTEN ASSOCIATED WITH TOXIC REACTIONS.   Salicylate level     Status: None   Collection Time: 08/29/16  7:31 PM  Result Value Ref Range   Salicylate Lvl <4.1 2.8 - 30.0 mg/dL  Lactic acid, plasma     Status: None   Collection Time: 08/29/16  7:31 PM  Result Value Ref  Range   Lactic Acid, Venous 0.8 0.5 - 1.9 mmol/L  Glucose, capillary     Status: Abnormal   Collection Time: 08/29/16  7:52 PM  Result Value Ref Range   Glucose-Capillary 111 (H) 65 - 99 mg/dL  Glucose, capillary     Status: Abnormal   Collection Time: 08/29/16  9:06 PM  Result Value Ref Range   Glucose-Capillary 103 (H) 65 - 99 mg/dL  Acetaminophen level     Status: Abnormal   Collection Time: 08/30/16  3:05 AM  Result Value Ref Range   Acetaminophen (Tylenol), Serum <10 (L) 10 - 30 ug/mL    Comment:        THERAPEUTIC CONCENTRATIONS VARY SIGNIFICANTLY. A RANGE OF 10-30 ug/mL MAY BE AN EFFECTIVE CONCENTRATION FOR MANY PATIENTS. HOWEVER, SOME ARE BEST TREATED AT CONCENTRATIONS OUTSIDE THIS RANGE. ACETAMINOPHEN CONCENTRATIONS >150 ug/mL AT 4 HOURS AFTER INGESTION AND >50 ug/mL AT 12 HOURS AFTER INGESTION ARE OFTEN ASSOCIATED WITH TOXIC REACTIONS.   Glucose, capillary     Status: None   Collection Time: 08/30/16  7:19 AM  Result Value Ref Range   Glucose-Capillary 86 65 - 99 mg/dL  Glucose, capillary     Status: None   Collection Time: 08/30/16 12:38 PM  Result Value Ref Range    Glucose-Capillary 90 65 - 99 mg/dL  Glucose, capillary     Status: Abnormal   Collection Time: 08/30/16  3:22 PM  Result Value Ref Range   Glucose-Capillary 108 (H) 65 - 99 mg/dL   Comment 1 Notify RN     No current facility-administered medications for this encounter.    Current Outpatient Prescriptions  Medication Sig Dispense Refill  . amLODipine (NORVASC) 10 MG tablet Take 1 tablet (10 mg total) by mouth daily. 90 tablet 2  . diazepam (VALIUM) 5 MG tablet TAKE 1 TABLET BY MOUTH EVERY 6 HOURS AS NEEDED FOR ANXIETY CAN TAKE 1/2-1 TABLET 100 tablet 2  . gabapentin (NEURONTIN) 100 MG capsule Take 1 capsule (100 mg total) by mouth 3 (three) times daily. 90 capsule 3  . hydrochlorothiazide (HYDRODIURIL) 25 MG tablet Take 25 mg by mouth daily.     Marland Kitchen HYDROcodone-acetaminophen (NORCO/VICODIN) 5-325 MG tablet Take 1-2 tablets by mouth every 4 (four) hours as needed for moderate pain. 20 tablet 0  . linaclotide (LINZESS) 290 MCG CAPS capsule Take 1 capsule (290 mcg total) by mouth daily before breakfast. 30 capsule 12  . Melatonin 10 MG TABS Take 10 mg by mouth at bedtime.    . metFORMIN (GLUCOPHAGE) 500 MG tablet Take 1 tablet (500 mg total) by mouth 2 (two) times daily with a meal. 180 tablet 2  . oxyCODONE-acetaminophen (PERCOCET/ROXICET) 5-325 MG tablet Take 1-2 tablets by mouth every 4 (four) hours as needed for moderate pain. 30 tablet 0    Musculoskeletal: Strength & Muscle Tone: within normal limits Gait & Station: unsteady Patient leans: Front  Psychiatric Specialty Exam: Physical Exam  Nursing note and vitals reviewed. Constitutional: She appears well-developed and well-nourished.  HENT:  Head: Normocephalic and atraumatic.  Eyes: Conjunctivae are normal. Pupils are equal, round, and reactive to light.  Neck: Normal range of motion.  Cardiovascular: Normal heart sounds.   Respiratory: Effort normal.  GI: Soft. There is tenderness. There is guarding.  Musculoskeletal: Normal  range of motion.  Neurological: She is alert.  Skin: Skin is warm and dry.  Psychiatric: Her speech is normal and behavior is normal. Thought content normal. Her mood appears anxious. Cognition and memory  are normal. She expresses impulsivity.    Review of Systems  Constitutional: Negative.   HENT: Negative.   Eyes: Negative.   Respiratory: Negative.   Cardiovascular: Negative.   Gastrointestinal: Positive for abdominal pain and nausea.  Musculoskeletal: Negative.   Skin: Negative.   Neurological: Negative.   Psychiatric/Behavioral: Negative for depression, hallucinations, memory loss, substance abuse and suicidal ideas. The patient is nervous/anxious and has insomnia.     Blood pressure 122/75, pulse (!) 108, temperature 98.2 F (36.8 C), temperature source Oral, resp. rate 18, height '5\' 3"'$  (1.6 m), weight 63.5 kg (140 lb), SpO2 99 %.Body mass index is 24.8 kg/m.  General Appearance: Casual  Eye Contact:  Good  Speech:  Clear and Coherent  Volume:  Normal  Mood:  Anxious  Affect:  Congruent  Thought Process:  Goal Directed  Orientation:  Full (Time, Place, and Person)  Thought Content:  Logical  Suicidal Thoughts:  No  Homicidal Thoughts:  No  Memory:  Immediate;   Good Recent;   Fair Remote;   Good  Judgement:  Fair  Insight:  Fair  Psychomotor Activity:  Decreased  Concentration:  Concentration: Fair  Recall:  [employer] of Knowledge:  Fair  Language:  Fair  Akathisia:  No  Handed:  Right  AIMS (if indicated):     Assets:  Communication Skills Desire for Improvement Financial Resources/Insurance Housing Physical Health Resilience Social Support  ADL's:  Impaired  Cognition:  WNL  Sleep:        Treatment Plan Summary: Plan 65 year old woman without a significant past psychiatric history. Took an impulsive overdose of a fairly small amount of a medicine without really expecting it was going to kill her. Does not report symptoms of major depression but has  been feeling stressed out by her pain. Patient denies any suicidal thoughts at all now. No sign of psychosis or dementia. Patient appears to of just had a moment of being overwhelmed. She talks at length about how much she regrets this behavior and intends to repent of it because she worries about its sinfulness. Patient does not meet commitment criteria. She can be released from the emergency room. I don't think she needs any specific psychiatric follow-up.  Disposition: Patient does not meet criteria for psychiatric inpatient admission. Supportive therapy provided about ongoing stressors.  Alethia Berthold, MD 08/30/2016 4:28 PM

## 2016-08-30 NOTE — ED Notes (Signed)
MD Clapacs at pt bedside at this time

## 2016-08-30 NOTE — ED Notes (Signed)
Poison control called and asked for update on patient's condition.

## 2016-08-31 NOTE — Discharge Summary (Signed)
  Patient ID: SHAHD VENZOR [id number] 65 y.o. November 01, 1951  08/21/2016  Discharge date and time: 08/26/2016   Admitting Physician: Excell Seltzer T  Discharge Physician: Excell Seltzer T  Admission Diagnoses: abdominal pain, small bowel obstruction, ventral hernia  Discharge Diagnoses: Same  Operations: Procedure(s): LAPAROSCOPIC LYSIS OF ADHESIONS LAPAROSCOPIC VENTRAL HERNIA INSERTION OF MESH  Admission Condition: fair  Discharged Condition: fair  Indication for Admission: Patient has a remote history of gastric bypass. She has a known incisional hernia. She has recurrent abdominal pain. She has had one episode of documented small bowel obstruction that resolved nonoperatively treated at another facility. She continues to have abdominal pain and has tenderness around her ventral hernia. With this constellation of findings I have recommended laparoscopy with examination for internal hernias or evidence of adhesions causing small bowel obstruction and repair of her incisional hernia. She is electively admitted for this.  Hospital Course: On the day of admission she underwent laparoscopy with complete examination of her small intestine showing no evidence of internal hernia. No active obstruction although there were pelvic adhesions that were completely lysed. She underwent repair of a Swiss cheese type midline incisional hernia laparoscopically. Her postoperative course was generally unremarkable with some initially significant pain over the first few days that gradually resolved. She had some expected ileus that gradually resolved but was able to be started on a diet and advanced without nausea or vomiting. Pain improved and her abdomen was benign. Wounds healing well. She was felt ready for discharge on the fifth postoperative day.   Disposition: Home  Patient Instructions:    Medication List    TAKE these medications   amLODipine 10 MG tablet Commonly known as:  NORVASC Take  1 tablet (10 mg total) by mouth daily.   diazepam 5 MG tablet Commonly known as:  VALIUM TAKE 1 TABLET BY MOUTH EVERY 6 HOURS AS NEEDED FOR ANXIETY CAN TAKE 1/2-1 TABLET   gabapentin 100 MG capsule Commonly known as:  NEURONTIN Take 1 capsule (100 mg total) by mouth 3 (three) times daily.   hydrochlorothiazide 25 MG tablet Commonly known as:  HYDRODIURIL Take 25 mg by mouth daily.   HYDROcodone-acetaminophen 5-325 MG tablet Commonly known as:  NORCO/VICODIN Take 1-2 tablets by mouth every 4 (four) hours as needed for moderate pain.   linaclotide 290 MCG Caps capsule Commonly known as:  LINZESS Take 1 capsule (290 mcg total) by mouth daily before breakfast.   Melatonin 10 MG Tabs Take 10 mg by mouth at bedtime.   metFORMIN 500 MG tablet Commonly known as:  GLUCOPHAGE Take 1 tablet (500 mg total) by mouth 2 (two) times daily with a meal.   oxyCODONE-acetaminophen 5-325 MG tablet Commonly known as:  PERCOCET/ROXICET Take 1-2 tablets by mouth every 4 (four) hours as needed for moderate pain.       Activity: no heavy lifting for 4 weeks Diet: Post gastric bypass Wound Care: none needed  Follow-up:  With Dr. Excell Seltzer in 3 weeks.  Signed: Edward Jolly MD, FACS  08/31/2016, 6:47 PM

## 2016-09-05 ENCOUNTER — Other Ambulatory Visit: Payer: Self-pay | Admitting: Family Medicine

## 2016-09-05 DIAGNOSIS — M5416 Radiculopathy, lumbar region: Secondary | ICD-10-CM

## 2016-09-06 ENCOUNTER — Encounter: Payer: Self-pay | Admitting: Family Medicine

## 2016-09-06 ENCOUNTER — Ambulatory Visit (INDEPENDENT_AMBULATORY_CARE_PROVIDER_SITE_OTHER): Payer: Medicare PPO | Admitting: Family Medicine

## 2016-09-06 VITALS — BP 100/54 | HR 88 | Temp 97.8°F | Resp 16 | Wt 141.0 lb

## 2016-09-06 DIAGNOSIS — M9979 Connective tissue and disc stenosis of intervertebral foramina of abdomen and other regions: Secondary | ICD-10-CM | POA: Diagnosis not present

## 2016-09-06 DIAGNOSIS — Z23 Encounter for immunization: Secondary | ICD-10-CM

## 2016-09-06 MED ORDER — CYCLOBENZAPRINE HCL 5 MG PO TABS: 5.0000 mg | ORAL_TABLET | Freq: Three times a day (TID) | ORAL | 1 refills | Status: DC | PRN

## 2016-09-06 NOTE — Progress Notes (Signed)
Patient: Julie Acevedo Female    DOB: 1951/10/17   65 y.o.   MRN: [id number] Visit Date: 09/06/2016  Today's Provider: [name], MD   Chief Complaint  Patient presents with  . Back Pain   Subjective:    Back Pain  This is a chronic problem. The current episode started more than 1 year ago. The problem has been gradually worsening since onset. The pain is present in the thoracic spine and lumbar spine. The quality of the pain is described as aching. Radiates to: right hip. The pain is at a severity of 10/10. The pain is severe. The pain is the same all the time. Exacerbated by: walking. Associated symptoms include abdominal pain (due to recent surgery) and numbness. Pertinent negatives include no bladder incontinence, bowel incontinence, dysuria or leg pain. Treatments tried: Valium. The treatment provided no relief.         Allergies  Allergen Reactions  . Lotrel [Amlodipine Besy-Benazepril Hcl] Anaphylaxis  . Contrast Media [Iodinated Diagnostic Agents] Swelling and Other (See Comments)    Reaction:  Neck swelling   . Niacin Hives  . Sulfa Antibiotics Hives     Current Outpatient Prescriptions:  .  amLODipine (NORVASC) 10 MG tablet, Take 1 tablet (10 mg total) by mouth daily., Disp: 90 tablet, Rfl: 2 .  hydrochlorothiazide (HYDRODIURIL) 25 MG tablet, Take 25 mg by mouth daily. , Disp: , Rfl:  .  linaclotide (LINZESS) 290 MCG CAPS capsule, Take 1 capsule (290 mcg total) by mouth daily before breakfast., Disp: 30 capsule, Rfl: 12 .  Melatonin 10 MG TABS, Take 10 mg by mouth at bedtime., Disp: , Rfl:  .  metFORMIN (GLUCOPHAGE) 500 MG tablet, Take 1 tablet (500 mg total) by mouth 2 (two) times daily with a meal., Disp: 180 tablet, Rfl: 2 .  diazepam (VALIUM) 5 MG tablet, TAKE 1 TABLET BY MOUTH EVERY 6 HOURS AS NEEDED FOR ANXIETY CAN TAKE 1/2-1 TABLET (Patient not taking: Reported on 09/06/2016), Disp: 100 tablet, Rfl: 2 .  gabapentin (NEURONTIN) 100 MG capsule, TAKE  1 CAPSULE (100 MG TOTAL) BY MOUTH 3 (THREE) TIMES DAILY. (Patient not taking: Reported on 09/06/2016), Disp: 90 capsule, Rfl: 11  Review of Systems  Constitutional: Negative.   Eyes: Negative.   Respiratory: Negative.   Cardiovascular: Negative.   Gastrointestinal: Positive for abdominal pain (due to recent surgery). Negative for bowel incontinence.  Endocrine: Negative.   Genitourinary: Negative for bladder incontinence and dysuria.  Musculoskeletal: Positive for back pain.  Allergic/Immunologic: Negative.   Neurological: Positive for numbness.  Psychiatric/Behavioral: Positive for agitation and dysphoric mood.    Social History  Substance Use Topics  . Smoking status: Never Smoker  . Smokeless tobacco: Never Used  . Alcohol use No   Objective:   BP (!) 100/54 (BP Location: Right Arm, Patient Position: Sitting, Cuff Size: Normal)   Pulse 88   Temp 97.8 F (36.6 C) (Oral)   Resp 16   Wt 141 lb (64 kg)   BMI 24.98 kg/m   Physical Exam  Constitutional: She appears well-developed and well-nourished.  HENT:  Head: Normocephalic and atraumatic.  Right Ear: External ear normal.  Left Ear: External ear normal.  Nose: Nose normal.  Eyes: Conjunctivae are normal. No scleral icterus.  Neck: No thyromegaly present.  Cardiovascular: Normal rate, regular rhythm and normal heart sounds.   Pulmonary/Chest: Effort normal and breath sounds normal. No respiratory distress.  Abdominal: Soft.  Musculoskeletal: She exhibits tenderness.  She exhibits no edema.  Tender over lower thoracic area across entire back  Lymphadenopathy:    She has no cervical adenopathy.  Neurological: No cranial nerve deficit. She exhibits normal muscle tone. Coordination normal.  Skin: Skin is warm and dry.  Psychiatric: She has a normal mood and affect. Her behavior is normal.  There is no area that is especially tender, there is no crepitance. No bruising or skin changes or swelling in these  areas.        Assessment & Plan:     1. Narrowing of intervertebral disc space Worsening. Start trial of cyclobenzaprine. Valium not helping. FU 2-3 weeks before appointment with Dr. Sharlet Salina. May need to add opioids at that time if no improvement.  - cyclobenzaprine (FLEXERIL) 5 MG tablet; Take 1 tablet (5 mg total) by mouth 3 (three) times daily as needed for muscle spasms.  Dispense: 30 tablet; Refill: 1  2. Need for shingles vaccine Administered today. - Varicella-zoster vaccine subcutaneous  3. Flu vaccine need Administered today. - Flu Vaccine QUAD 36+ mos IM  4. Chronic back pain This is very vague in description and in physical findings. It is now gone from LS area to thoracic. I have no doubt that she has chronic pain. I think that being followed by pain clinic is very important for this nice lady. 5.Recent abdominal pain with surgery for ventral hernia She is recovering from this surgery from a month ago. 6. Chronic depression and anxiety I think this is a major issue for daily life for this patient. Orthopedic percent of this visit is in counseling regarding these issues.    Patient seen and examined by Miguel Aschoff, MD, and note scribed by Renaldo Fiddler, CMA.   Batsheva Stevick Cranford Mon, MD  [facility]

## 2016-09-14 ENCOUNTER — Emergency Department: Payer: Medicare PPO

## 2016-09-14 ENCOUNTER — Emergency Department
Admission: EM | Admit: 2016-09-14 | Discharge: 2016-09-14 | Disposition: A | Discharge status: Home / Self Care | Payer: Medicare PPO | Attending: Emergency Medicine | Admitting: Emergency Medicine

## 2016-09-14 ENCOUNTER — Encounter: Payer: Self-pay | Admitting: Emergency Medicine

## 2016-09-14 DIAGNOSIS — E119 Type 2 diabetes mellitus without complications: Secondary | ICD-10-CM | POA: Diagnosis not present

## 2016-09-14 DIAGNOSIS — Z7984 Long term (current) use of oral hypoglycemic drugs: Secondary | ICD-10-CM | POA: Insufficient documentation

## 2016-09-14 DIAGNOSIS — Y92410 Unspecified street and highway as the place of occurrence of the external cause: Secondary | ICD-10-CM | POA: Diagnosis not present

## 2016-09-14 DIAGNOSIS — Y9389 Activity, other specified: Secondary | ICD-10-CM | POA: Diagnosis not present

## 2016-09-14 DIAGNOSIS — S199XXA Unspecified injury of neck, initial encounter: Secondary | ICD-10-CM | POA: Diagnosis present

## 2016-09-14 DIAGNOSIS — Y999 Unspecified external cause status: Secondary | ICD-10-CM | POA: Insufficient documentation

## 2016-09-14 DIAGNOSIS — Z79899 Other long term (current) drug therapy: Secondary | ICD-10-CM | POA: Diagnosis not present

## 2016-09-14 DIAGNOSIS — S161XXA Strain of muscle, fascia and tendon at neck level, initial encounter: Secondary | ICD-10-CM | POA: Diagnosis not present

## 2016-09-14 DIAGNOSIS — M549 Dorsalgia, unspecified: Secondary | ICD-10-CM | POA: Insufficient documentation

## 2016-09-14 DIAGNOSIS — I10 Essential (primary) hypertension: Secondary | ICD-10-CM | POA: Insufficient documentation

## 2016-09-14 MED ORDER — HYDROCODONE-ACETAMINOPHEN 5-325 MG PO TABS
1.0000 | ORAL_TABLET | Freq: Once | ORAL | Status: AC
  Administered 2016-09-14: 1 via ORAL
  Filled 2016-09-14: qty 1

## 2016-09-14 NOTE — Discharge Instructions (Signed)
Take your home meds as prescribed for pain. Apply ice to any sore muscles. Follow-up with your provider for continued symptoms. You should also have a outpatient ultrasound of your thyroid ordered by your provider for findings on your CT scan.
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
## 2016-09-17 ENCOUNTER — Ambulatory Visit (INDEPENDENT_AMBULATORY_CARE_PROVIDER_SITE_OTHER): Payer: Medicare PPO | Admitting: Family Medicine

## 2016-09-17 ENCOUNTER — Encounter: Payer: Self-pay | Admitting: Family Medicine

## 2016-09-17 VITALS — BP 110/58 | HR 74 | Temp 97.7°F | Resp 16 | Wt 141.0 lb

## 2016-09-17 DIAGNOSIS — E041 Nontoxic single thyroid nodule: Secondary | ICD-10-CM

## 2016-09-17 DIAGNOSIS — M9979 Connective tissue and disc stenosis of intervertebral foramina of abdomen and other regions: Secondary | ICD-10-CM | POA: Diagnosis not present

## 2016-09-17 DIAGNOSIS — M549 Dorsalgia, unspecified: Secondary | ICD-10-CM | POA: Diagnosis not present

## 2016-09-17 DIAGNOSIS — M5416 Radiculopathy, lumbar region: Secondary | ICD-10-CM | POA: Diagnosis not present

## 2016-09-17 DIAGNOSIS — G8929 Other chronic pain: Secondary | ICD-10-CM | POA: Diagnosis not present

## 2016-09-17 MED ORDER — HYDROCODONE-ACETAMINOPHEN 5-325 MG PO TABS: 1.0000 | ORAL_TABLET | Freq: Three times a day (TID) | ORAL | 0 refills | Status: DC
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
## 2016-09-19 ENCOUNTER — Other Ambulatory Visit: Payer: Self-pay | Admitting: Family Medicine

## 2016-09-19 DIAGNOSIS — I10 Essential (primary) hypertension: Secondary | ICD-10-CM

## 2016-09-26 ENCOUNTER — Ambulatory Visit: Payer: Self-pay | Admitting: Family Medicine

## 2016-09-27 ENCOUNTER — Encounter: Payer: Self-pay | Admitting: Emergency Medicine

## 2016-09-27 ENCOUNTER — Emergency Department
Admission: EM | Admit: 2016-09-27 | Discharge: 2016-09-27 | Disposition: A | Discharge status: Home / Self Care | Payer: Medicare PPO | Attending: Student in an Organized Health Care Education/Training Program | Admitting: Student in an Organized Health Care Education/Training Program

## 2016-09-27 ENCOUNTER — Emergency Department: Payer: Medicare PPO

## 2016-09-27 DIAGNOSIS — Z79899 Other long term (current) drug therapy: Secondary | ICD-10-CM | POA: Diagnosis not present

## 2016-09-27 DIAGNOSIS — Y939 Activity, unspecified: Secondary | ICD-10-CM | POA: Insufficient documentation

## 2016-09-27 DIAGNOSIS — X58XXXA Exposure to other specified factors, initial encounter: Secondary | ICD-10-CM | POA: Diagnosis not present

## 2016-09-27 DIAGNOSIS — S50811A Abrasion of right forearm, initial encounter: Secondary | ICD-10-CM | POA: Diagnosis not present

## 2016-09-27 DIAGNOSIS — Z7984 Long term (current) use of oral hypoglycemic drugs: Secondary | ICD-10-CM | POA: Insufficient documentation

## 2016-09-27 DIAGNOSIS — Y929 Unspecified place or not applicable: Secondary | ICD-10-CM | POA: Insufficient documentation

## 2016-09-27 DIAGNOSIS — E119 Type 2 diabetes mellitus without complications: Secondary | ICD-10-CM | POA: Insufficient documentation

## 2016-09-27 DIAGNOSIS — Z23 Encounter for immunization: Secondary | ICD-10-CM | POA: Insufficient documentation

## 2016-09-27 DIAGNOSIS — T148XXA Other injury of unspecified body region, initial encounter: Secondary | ICD-10-CM

## 2016-09-27 DIAGNOSIS — I10 Essential (primary) hypertension: Secondary | ICD-10-CM | POA: Diagnosis not present

## 2016-09-27 DIAGNOSIS — E860 Dehydration: Secondary | ICD-10-CM | POA: Insufficient documentation

## 2016-09-27 DIAGNOSIS — R55 Syncope and collapse: Secondary | ICD-10-CM | POA: Diagnosis present

## 2016-09-27 DIAGNOSIS — Y999 Unspecified external cause status: Secondary | ICD-10-CM | POA: Diagnosis not present

## 2016-09-27 LAB — COMPREHENSIVE METABOLIC PANEL
ALT: 12 U/L — ABNORMAL LOW (ref 14–54)
AST: 24 U/L (ref 15–41)
Albumin: 3.8 g/dL (ref 3.5–5.0)
Alkaline Phosphatase: 111 U/L (ref 38–126)
Anion gap: 10 (ref 5–15)
BUN: 26 mg/dL — ABNORMAL HIGH (ref 6–20)
CO2: 28 mmol/L (ref 22–32)
Calcium: 9.5 mg/dL (ref 8.9–10.3)
Chloride: 102 mmol/L (ref 101–111)
Creatinine, Ser: 1.2 mg/dL — ABNORMAL HIGH (ref 0.44–1.00)
GFR calc Af Amer: 55 mL/min — ABNORMAL LOW (ref >60)
GFR calc non Af Amer: 47 mL/min — ABNORMAL LOW (ref >60)
Glucose, Bld: 100 mg/dL — ABNORMAL HIGH (ref 65–99)
Potassium: 2.7 mmol/L — CL (ref 3.5–5.1)
Sodium: 140 mmol/L (ref 135–145)
Total Bilirubin: 0.3 mg/dL (ref 0.3–1.2)
Total Protein: 7.3 g/dL (ref 6.5–8.1)

## 2016-09-27 LAB — CBC WITH DIFFERENTIAL/PLATELET
Basophils Absolute: 0 10*3/uL (ref 0–0.1)
Basophils Relative: 0 %
Eosinophils Absolute: 0.1 10*3/uL (ref 0–0.7)
Eosinophils Relative: 1 %
HCT: 31.5 % — ABNORMAL LOW (ref 35.0–47.0)
Hemoglobin: 10.7 g/dL — ABNORMAL LOW (ref 12.0–16.0)
Lymphocytes Relative: 33 %
Lymphs Abs: 2.9 10*3/uL (ref 1.0–3.6)
MCH: 27.4 pg (ref 26.0–34.0)
MCHC: 33.9 g/dL (ref 32.0–36.0)
MCV: 80.9 fL (ref 80.0–100.0)
Monocytes Absolute: 0.5 10*3/uL (ref 0.2–0.9)
Monocytes Relative: 5 %
Neutro Abs: 5.3 10*3/uL (ref 1.4–6.5)
Neutrophils Relative %: 61 %
Platelets: 290 10*3/uL (ref 150–440)
RBC: 3.9 MIL/uL (ref 3.80–5.20)
RDW: 15.6 % — ABNORMAL HIGH (ref 11.5–14.5)
WBC: 8.8 10*3/uL (ref 3.6–11.0)

## 2016-09-27 LAB — PROTIME-INR
INR: 1.01
Prothrombin Time: 13.3 seconds (ref 11.4–15.2)

## 2016-09-27 LAB — APTT: aPTT: 31 seconds (ref 24–36)

## 2016-09-27 MED ORDER — PROMETHAZINE HCL 25 MG/ML IJ SOLN
12.5000 mg | Freq: Once | INTRAMUSCULAR | Status: AC
  Administered 2016-09-27: 12.5 mg via INTRAVENOUS

## 2016-09-27 MED ORDER — POTASSIUM CHLORIDE CRYS ER 20 MEQ PO TBCR
40.0000 meq | EXTENDED_RELEASE_TABLET | Freq: Once | ORAL | Status: AC
  Administered 2016-09-27: 40 meq via ORAL
  Filled 2016-09-27: qty 2

## 2016-09-27 MED ORDER — PROMETHAZINE HCL 25 MG/ML IJ SOLN
INTRAMUSCULAR | Status: AC
  Filled 2016-09-27: qty 1

## 2016-09-27 MED ORDER — SODIUM CHLORIDE 0.9 % IV BOLUS (SEPSIS)
500.0000 mL | Freq: Once | INTRAVENOUS | Status: AC
  Administered 2016-09-27: 500 mL via INTRAVENOUS

## 2016-09-27 MED ORDER — TETANUS-DIPHTH-ACELL PERTUSSIS 5-2.5-18.5 LF-MCG/0.5 IM SUSP
0.5000 mL | Freq: Once | INTRAMUSCULAR | Status: AC
  Administered 2016-09-27: 0.5 mL via INTRAMUSCULAR
  Filled 2016-09-27: qty 0.5

## 2016-09-27 NOTE — ED Triage Notes (Signed)
Pt here with snake bite to right forearm from 1 hour PTA. First nurse reports pt became very pale and had a near syncopal episode upon entering ED. Pt reports nausea and heart palpitations at present, dry mouth.

## 2016-09-27 NOTE — ED Provider Notes (Signed)
[facility] Emergency Department Provider Note    First MD Initiated Contact with Patient 09/27/16 1456     (approximate)  I have reviewed the triage vital signs and the nursing notes.   HISTORY  Chief Complaint Near Syncope and Snake Bite    HPI Julie Acevedo is a 65 y.o. female who presents with concern for possible snake bite. Patient was with her husband planting to lips. She is reach into the bushes felt sudden sharp 1010 pain to the right forearm. She did not see a snake and she put her arm back rate quickly. Noted that she had to abrasions to the right forearm that look like bite marks. She denies any other symptoms at this time. Does complain of some nausea. No shortness of breath. Does feel that she has a dry mouth. Denies using any pesticides. Denies any recent fevers. Denies any abdominal pain. No diarrhea.   Past Medical History:  Diagnosis Date  . Arthritis   . Diverticulitis   . DM (diabetes mellitus) ([place])   . HTN (hypertension)   . Memory difficulties 09/01/2015  . Short-term memory loss     Patient Active Problem List   Diagnosis Date Noted  . Pain at surgical site 08/30/2016  . Ventral incisional hernia 08/21/2016  . SBO (small bowel obstruction)   . Partial small bowel obstruction 04/23/2016  . Adjustment disorder with mixed anxiety and depressed mood 04/23/2016  . Memory difficulties 09/01/2015  . Arthritis 06/11/2015  . Back pain, chronic 06/11/2015  . HLD (hyperlipidemia) 06/11/2015  . Cannot sleep 06/11/2015  . L-S radiculopathy 06/11/2015  . Arthritis of knee, degenerative 06/11/2015  . B12 deficiency 06/11/2015  . 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 DM2 (diabetes mellitus, type 2) ([place]) 11/15/2012  . DM (diabetes mellitus) ([place]) 01/27/2012  . HTN (hypertension) 01/27/2012  . Adiposity 11/21/2009  . Avitaminosis D 11/07/2009  . Narrowing of  intervertebral disc space 07/25/2009  . Diabetes ([place]) 07/25/2009  . Benign essential HTN 07/25/2009    Past Surgical History:  Procedure Laterality Date  . ABDOMINAL HYSTERECTOMY     due to endometriosis-1 ovary left  . BREAST EXCISIONAL BIOPSY Right 1971   neg  . CARDIAC CATHETERIZATION  2000   no significant CAD per note of Dr Caryl Comes in 2013   . CHOLECYSTECTOMY    . EYE SURGERY    . FOOT SURGERY    . HAND SURGERY    . HEMICOLECTOMY    . INSERTION OF MESH N/A 08/21/2016   Procedure: INSERTION OF MESH;  Surgeon: Excell Seltzer, MD;  Location: [facility] ORS;  Service: General;  Laterality: N/A;  . LAPAROSCOPIC LYSIS OF ADHESIONS N/A 08/21/2016   Procedure: LAPAROSCOPIC LYSIS OF ADHESIONS;  Surgeon: Excell Seltzer, MD;  Location: [facility] ORS;  Service: General;  Laterality: N/A;  . LUMBAR [place]    . ROUX-EN-Y GASTRIC BYPASS    . TONSILLECTOMY    . UPPER GI ENDOSCOPY  01/12/16   normal larynx, normal esophagus. gastric bypass with a normal-sized pouch and intact staple line, normal examined jejunum, otherwise exam was normal  . VENTRAL HERNIA REPAIR N/A 08/21/2016   Procedure: LAPAROSCOPIC VENTRAL HERNIA;  Surgeon: Excell Seltzer, MD;  Location: [facility]S;  Service: General;  Laterality: N/A;    Prior to Admission medications   Medication Sig Start Date End Date Taking? Authorizing Provider  amLODipine (NORVASC) 10 MG tablet TAKE 1 TABLET (10 MG  TOTAL) BY MOUTH DAILY. 09/19/16   Richard Maceo Pro., MD  cyclobenzaprine (FLEXERIL) 5 MG tablet Take 1 tablet (5 mg total) by mouth 3 (three) times daily as needed for muscle spasms. 09/06/16   Richard Maceo Pro., MD  diazepam (VALIUM) 5 MG tablet TAKE 1 TABLET BY MOUTH EVERY 6 HOURS AS NEEDED FOR ANXIETY CAN TAKE 1/2-1 TABLET Patient not taking: Reported on 09/17/2016 07/24/16   Jerrol Banana., MD  gabapentin (NEURONTIN) 100 MG capsule TAKE 1 CAPSULE (100 MG TOTAL) BY MOUTH 3 (THREE) TIMES DAILY. 09/05/16   Richard Maceo Pro., MD    hydrochlorothiazide (HYDRODIURIL) 25 MG tablet Take 25 mg by mouth daily.  05/30/16   Historical Provider, MD  HYDROcodone-acetaminophen (NORCO/VICODIN) 5-325 MG tablet Take 1 tablet by mouth 3 (three) times daily. 09/17/16   Richard Maceo Pro., MD  linaclotide [facility]) 290 MCG CAPS capsule Take 1 capsule (290 mcg total) by mouth daily before breakfast. 06/07/16   Jerrol Banana., MD  Melatonin 10 MG TABS Take 10 mg by mouth at bedtime.    Historical Provider, MD  metFORMIN (GLUCOPHAGE) 500 MG tablet Take 1 tablet (500 mg total) by mouth 2 (two) times daily with a meal. 11/03/15   Jerrol Banana., MD    Allergies Lotrel [amlodipine besy-benazepril hcl]; Contrast media [iodinated diagnostic agents]; Niacin; and Sulfa antibiotics  Family History  Problem Relation Age of Onset  . Cancer Father 33    Lung  . Coronary artery disease Father 71  . COPD Mother   . Diabetes Mother   . Hypertension Mother   . Cancer Paternal Grandmother     Breast  . Breast cancer Paternal Grandmother   . Congestive Heart Failure Maternal Grandmother   . Emphysema Maternal Grandfather   . Heart attack Paternal Grandfather   . Cancer Paternal Aunt     Breast  . Breast cancer Paternal Aunt 55  . Breast cancer Paternal Aunt 36    Social History Social History  Substance Use Topics  . Smoking status: Never Smoker  . Smokeless tobacco: Never Used  . Alcohol use No    Review of Systems Patient denies headaches, rhinorrhea, blurry vision, numbness, shortness of breath, chest pain, edema, cough, abdominal pain, nausea, vomiting, diarrhea, dysuria, fevers, rashes or hallucinations unless otherwise stated above in HPI. ____________________________________________   PHYSICAL EXAM:  VITAL SIGNS: Vitals:   09/27/16 1900 09/27/16 1930  BP: 123/66 117/71  Pulse: 76 81  Resp: 17 (!) 22  Temp:      Constitutional: Alert and oriented. Well appearing and in no acute distress. Eyes:  Conjunctivae are normal. PERRL. EOMI. Head: Atraumatic. Nose: No congestion/rhinnorhea. Mouth/Throat: Mucous membranes are moist.  Oropharynx non-erythematous. Neck: No stridor. Painless ROM. No cervical spine tenderness to palpation Hematological/Lymphatic/Immunilogical: No cervical lymphadenopathy. Cardiovascular: mildly tachycardic, regular rhythm. Grossly normal heart sounds. No ronchorrea  Good peripheral circulation. Respiratory: Normal respiratory effort.  No retractions. Lungs CTAB. Gastrointestinal: Soft and nontender. No distention. No abdominal bruits. No CVA tenderness. Genitourinary:  Musculoskeletal: No lower extremity tenderness nor edema.  No joint effusions. Neurologic:  Normal speech and language. No gross focal neurologic deficits are appreciated. No gait instability. Skin:  There are two superficial linear abrasions to anterior right forearm.  Compartment is soft, Skin is warm, dry and intact. No rash noted. Psychiatric: Mood and affect are normal. Speech and behavior are normal.  ____________________________________________   LABS (all labs ordered are listed, but only abnormal  results are displayed)  Results for orders placed or performed during the hospital encounter of 09/27/16 (from the past 24 hour(s))  CBC with Differential/Platelet     Status: Abnormal   Collection Time: 09/27/16  3:20 PM  Result Value Ref Range   WBC 8.8 3.6 - 11.0 K/uL   RBC 3.90 3.80 - 5.20 MIL/uL   Hemoglobin 10.7 (L) 12.0 - 16.0 g/dL   HCT 31.5 (L) 35.0 - 47.0 %   MCV 80.9 80.0 - 100.0 fL   MCH 27.4 26.0 - 34.0 pg   MCHC 33.9 32.0 - 36.0 g/dL   RDW 15.6 (H) 11.5 - 14.5 %   Platelets 290 150 - 440 K/uL   Neutrophils Relative % 61 %   Neutro Abs 5.3 1.4 - 6.5 K/uL   Lymphocytes Relative 33 %   Lymphs Abs 2.9 1.0 - 3.6 K/uL   Monocytes Relative 5 %   Monocytes Absolute 0.5 0.2 - 0.9 K/uL   Eosinophils Relative 1 %   Eosinophils Absolute 0.1 0 - 0.7 K/uL   Basophils Relative 0 %     Basophils Absolute 0.0 0 - 0.1 K/uL  Comprehensive metabolic panel     Status: Abnormal   Collection Time: 09/27/16  3:20 PM  Result Value Ref Range   Sodium 140 135 - 145 mmol/L   Potassium 2.7 (LL) 3.5 - 5.1 mmol/L   Chloride 102 101 - 111 mmol/L   CO2 28 22 - 32 mmol/L   Glucose, Bld 100 (H) 65 - 99 mg/dL   BUN 26 (H) 6 - 20 mg/dL   Creatinine, Ser 1.20 (H) 0.44 - 1.00 mg/dL   Calcium 9.5 8.9 - 10.3 mg/dL   Total Protein 7.3 6.5 - 8.1 g/dL   Albumin 3.8 3.5 - 5.0 g/dL   AST 24 15 - 41 U/L   ALT 12 (L) 14 - 54 U/L   Alkaline Phosphatase 111 38 - 126 U/L   Total Bilirubin 0.3 0.3 - 1.2 mg/dL   GFR calc non Af Amer 47 (L) >60 mL/min   GFR calc Af Amer 55 (L) >60 mL/min   Anion gap 10 5 - 15  Protime-INR     Status: None   Collection Time: 09/27/16  3:20 PM  Result Value Ref Range   Prothrombin Time 13.3 11.4 - 15.2 seconds   INR 1.01   APTT     Status: None   Collection Time: 09/27/16  3:20 PM  Result Value Ref Range   aPTT 31 24 - 36 seconds   ____________________________________________  EKG My review and personal interpretation at Time: 15:01   Indication: tachycardia  Rate: 110  Rhythm: sinus Axis: normal intervals normal axis Other: non specific ST changes, no acute ischemia ____________________________________________  RADIOLOGY  I personally reviewed all radiographic images ordered to evaluate for the above acute complaints and reviewed radiology reports and findings.  These findings were personally discussed with the patient.  Please see medical record for radiology report.  ____________________________________________   PROCEDURES  Procedure(s) performed: none    Critical Care performed: no ____________________________________________   INITIAL IMPRESSION / ASSESSMENT AND PLAN / ED COURSE  Pertinent labs & imaging results that were available during my care of the patient were reviewed by me and considered in my medical decision making (see chart  for details).  DDX: Snake bite, contusion, laceration, dehydration, dysrhythmia, which led abnormality, coagulopathy  OVI DOKE is a 65 y.o. who presents to the ED with concern for snake  bite and near syncopal episode while coming into the ER. She did not syncopized. Does not lightheaded. Arrives afebrile hemodynamic stable with a mild tachycardia. She has no evidence of toxidrome and no exposure to any pesticides. Clinical exam is not consistent with a snake bite  Clinical Course  Comment By Time  Hr improved with IVF.  Lan consistent with dehydration.   Merlyn Lot, MD 10/12 1735  Patient reassessed. All compartments minute soft. No surrounding erythema. No surrounding pain. Able to passively range the wrist and move the upper extremity. Do not feel is consistent with snake bite. Patient was given IV fluids with improvement in her tachycardia and patient was able to ambulate with a steady gait without any symptoms.   Merlyn Lot, MD 10/12 [phone]  Patient was able to tolerate PO and was able to ambulate with a steady gait.  Have discussed with the patient and available family all diagnostics and treatments performed thus far and all questions were answered to the best of my ability. The patient demonstrates understanding and agreement with plan.  Merlyn Lot, MD 10/12 1845     ____________________________________________   FINAL CLINICAL IMPRESSION(S) / ED DIAGNOSES  Final diagnoses:  Near syncope  Dehydration  Abrasion      NEW MEDICATIONS STARTED DURING THIS VISIT:  Discharge Medication List as of 09/27/2016  6:46 PM       Note:  This document was prepared using Dragon voice recognition software and may include unintentional dictation errors.    Merlyn Lot, MD 09/27/16 [phone]

## 2016-09-27 NOTE — ED Notes (Signed)
Pt back on room air.

## 2016-10-08 IMAGING — CT CT HEAD W/O CM
1 series · 15 of 29 positions shown, 19 images · non-contrast
Comparison: MRI of the brain November 16, 2012 and CT scan of the
brain November 15, 2012

CLINICAL DATA: Code stroke; left-sided facial droop with numbness
and left arm drift and left-sided weakness and unequal grip; history
of previous CVA

EXAM:
CT HEAD WITHOUT CONTRAST
TECHNIQUE: Contiguous axial images were obtained from the base of the skull
through the vertex without intravenous contrast.

[Series 2: head 5.0 h30s · axial · 0.41mm/px · z∈[+1087,+1217]mm · 15 of 29 slices shown, 19 images]
[im 2/29  brain]
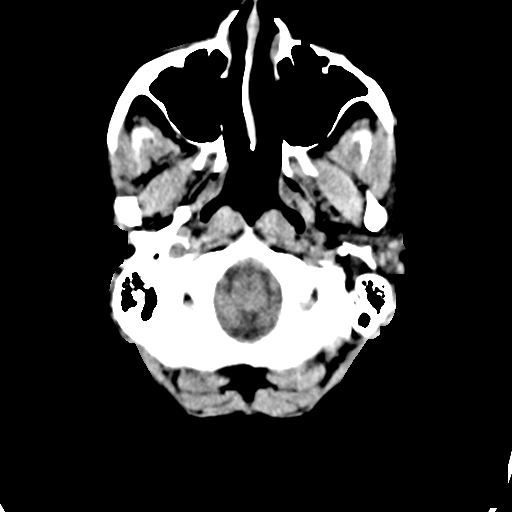
[im 2/29  bone]
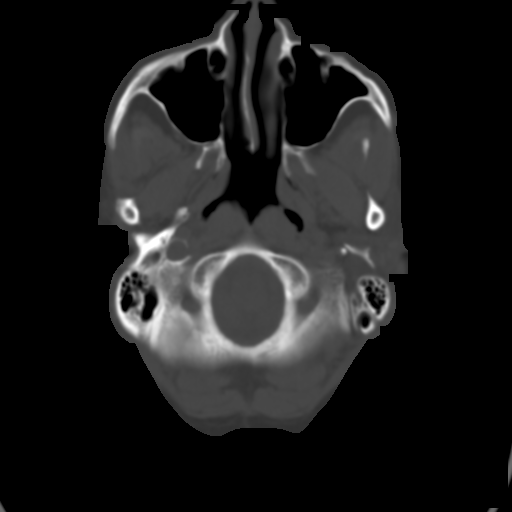
[im 4/29  brain]
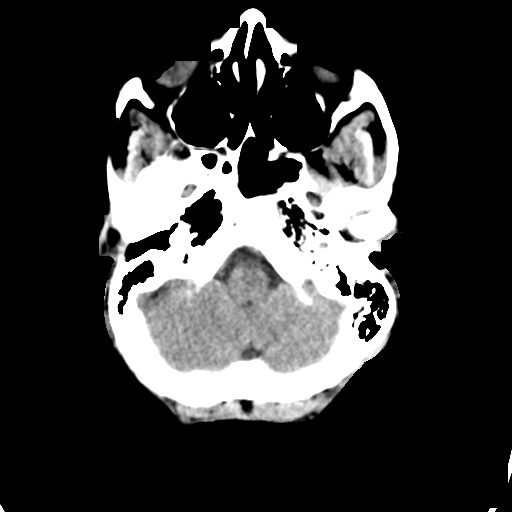
[im 6/29  brain]
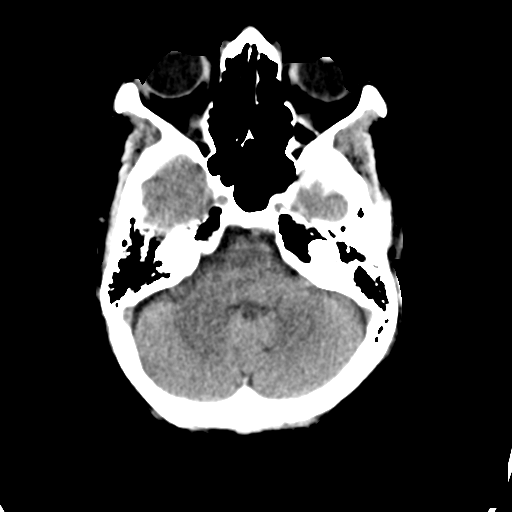
[im 8/29  brain]
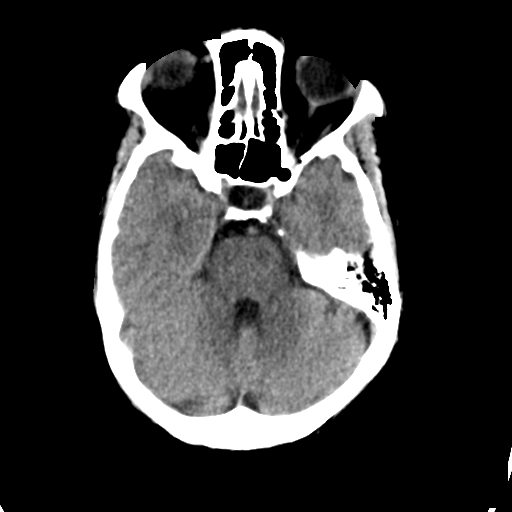
[im 10/29  brain]
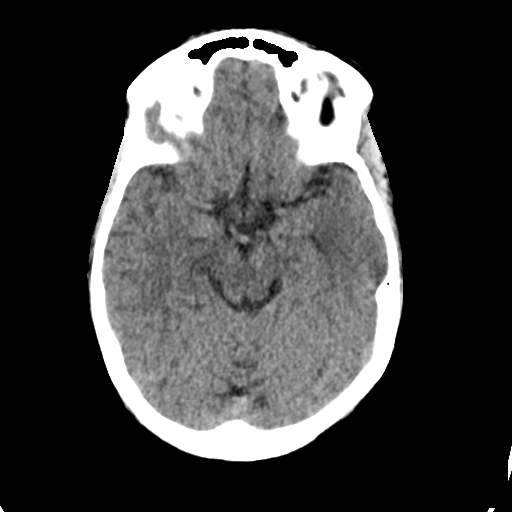
[im 10/29  bone]
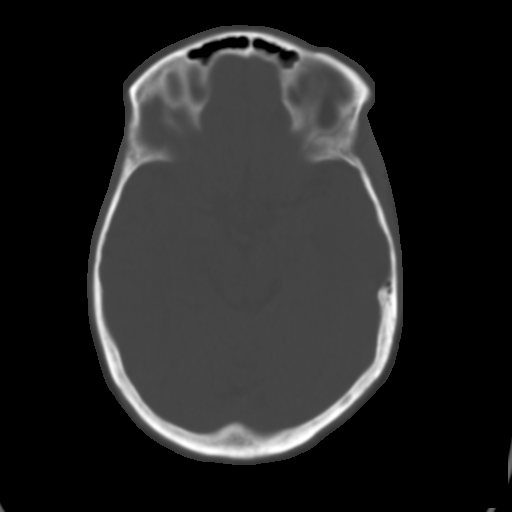
[im 11/29  brain]
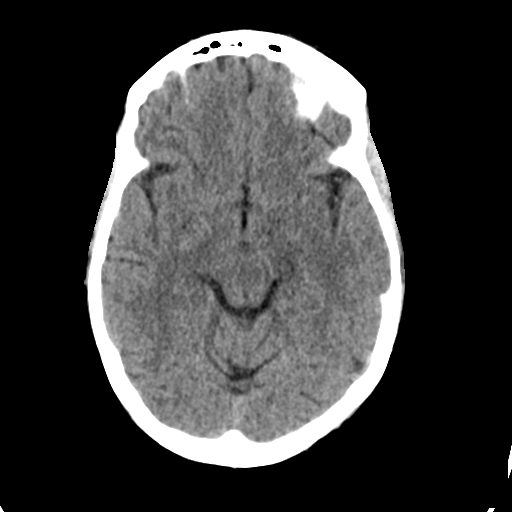
[im 13/29  brain]
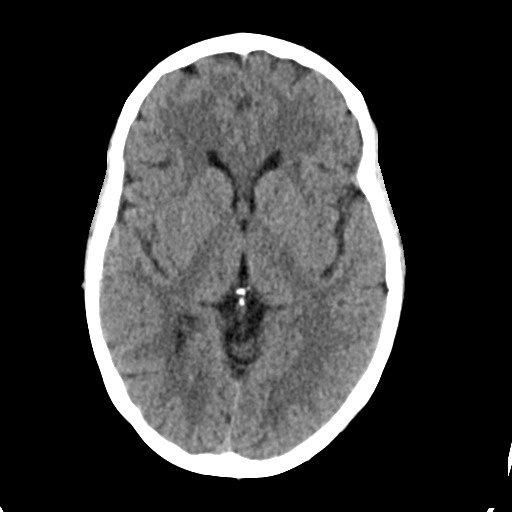
[im 15/29  brain]
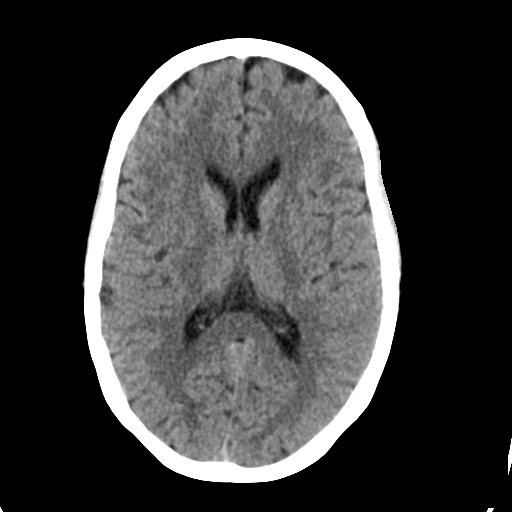
[im 17/29  brain]
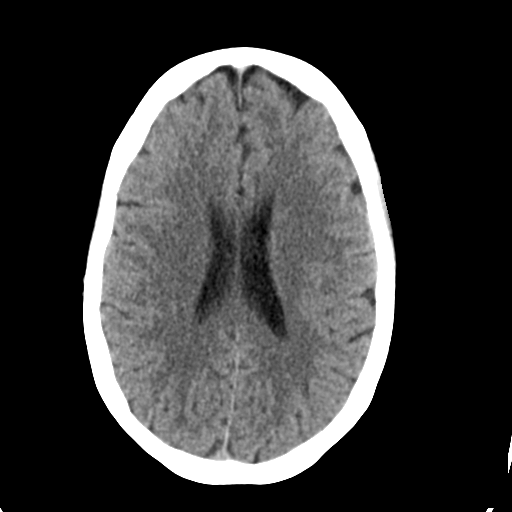
[im 17/29  bone]
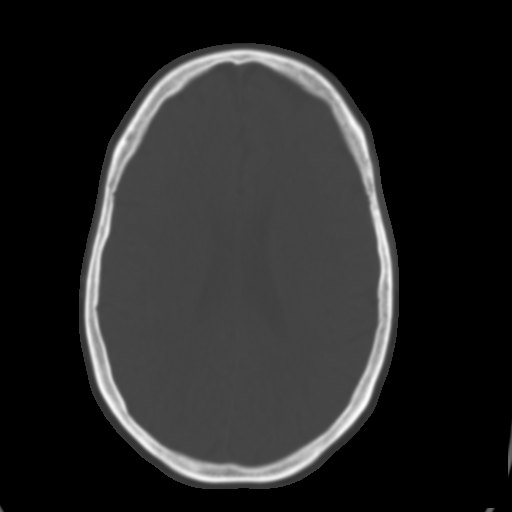
[im 19/29  brain]
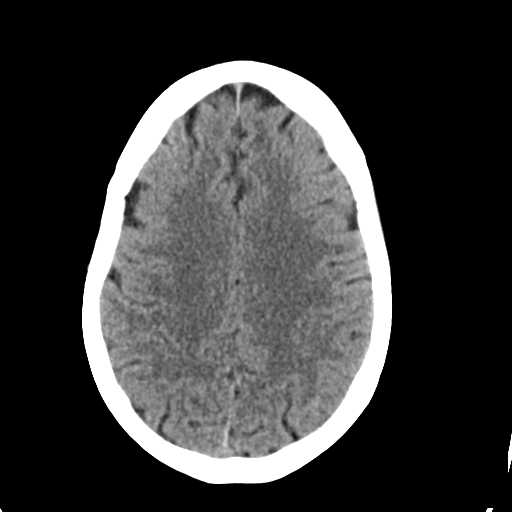
[im 20/29  brain]
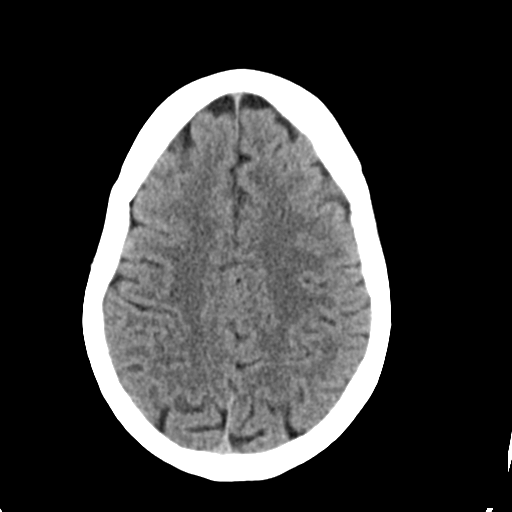
[im 22/29  brain]
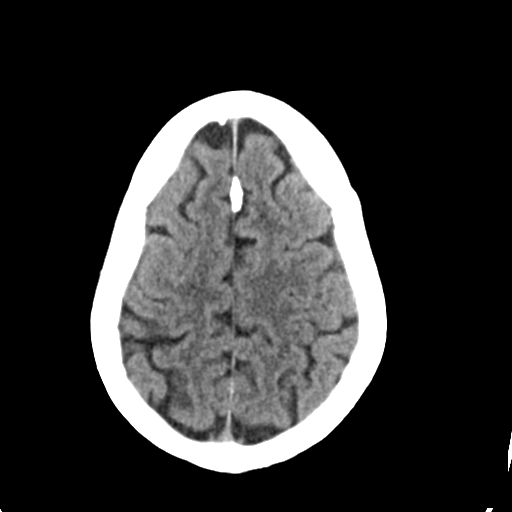
[im 24/29  brain]
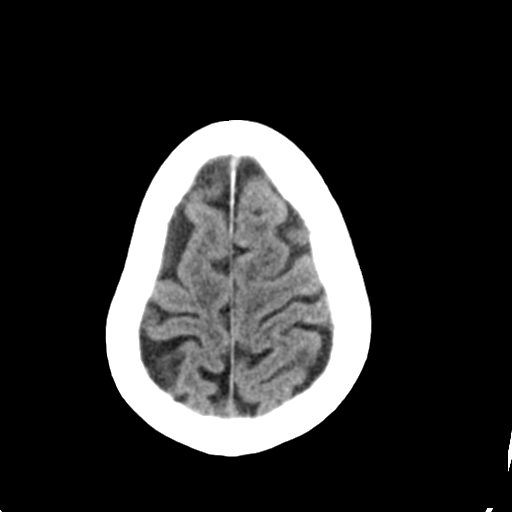
[im 24/29  bone]
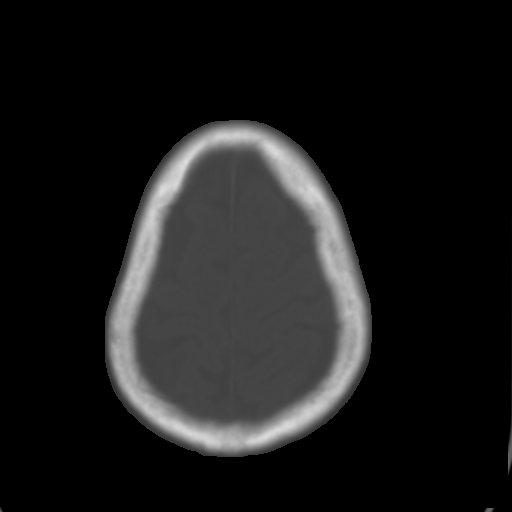
[im 26/29  brain]
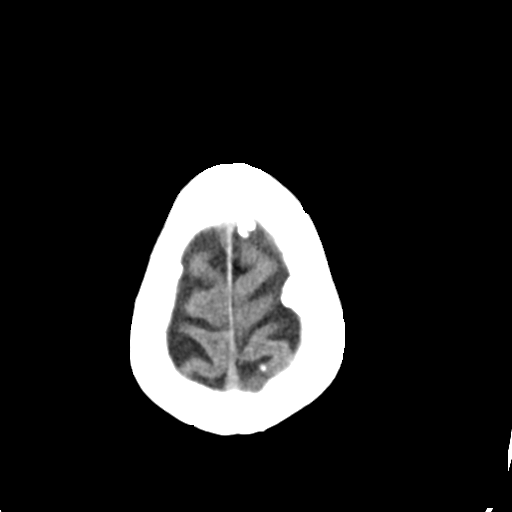
[im 28/29  brain]
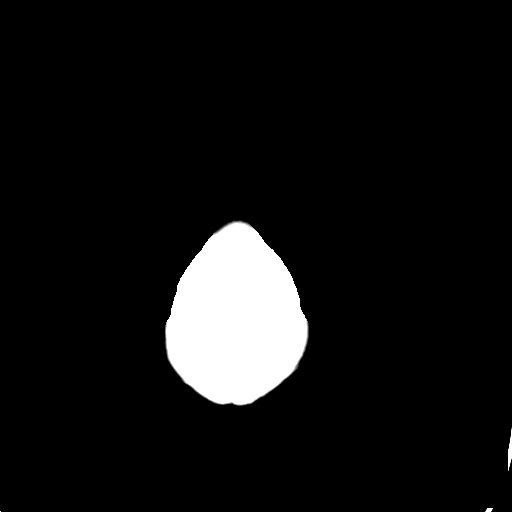

[15 of 29 positions shown; findings below may reference images not displayed]

FINDINGS: The ventricles are normal in size and position. There is no
intracranial hemorrhage nor intracranial mass effect. No acute
ischemic changes are demonstrated. There are stable punctate basal
ganglia calcifications bilaterally. Stable minimally decreased
density in the deep white matter of both cerebral hemispheres is
noted.

The observed paranasal sinuses and mastoid air cells are clear.
There is no skull fracture nor cephalohematoma.
IMPRESSION: 1. There is no acute intracranial hemorrhage nor evidence of acute
ischemic change. Minimal stable white matter hypodensity is present
bilaterally consistent with chronic small vessel ischemia.
2. These results were called by telephone at the time of
interpretation on 10/04/2014 at [DATE] to Dr. Roberto, who
verbally acknowledged these results.

## 2016-10-08 IMAGING — MR MR HEAD W/O CM
10 of 13 series · 26 of 48 positions shown · non-contrast
Comparison: Head CT 10/04/2014, MRI 11/16/2012, and MRA 07/27/2006.

CLINICAL DATA: Code stroke. Left-sided weakness and slurred speech.

EXAM:
MRI HEAD WITHOUT CONTRAST
MRA HEAD WITHOUT CONTRAST
TECHNIQUE: Multiplanar, multiecho pulse sequences of the brain and surrounding
structures were obtained without intravenous contrast. Angiographic
images of the head were obtained using MRA technique without
contrast.

[Series 3: DWI · axial · 5.0mm · 1.02mm/px · z∈[-125,+21]mm · 3 of 62 slices shown (1 of 4)]
[im 1/62]
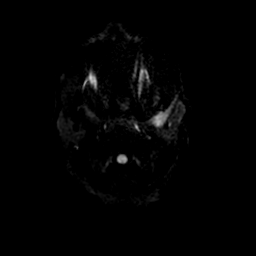
[im 31/62]
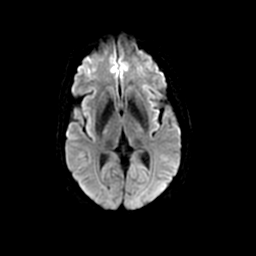
[im 62/62]
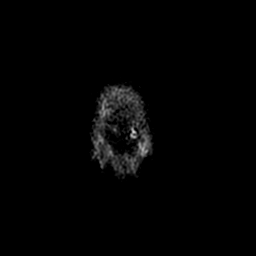

[Series 4: FLAIR · axial · 5.0mm · 0.43mm/px · 1 of 26 slices shown (1 of 2)]
[im 1/26]
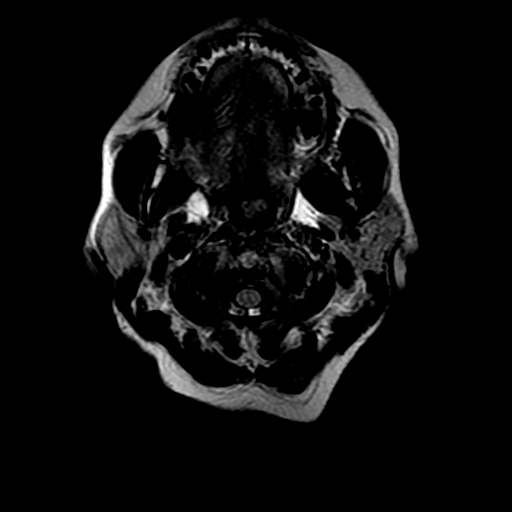

[Series 5: T2 · axial · 5.0mm · 0.43mm/px · 1 of 26 slices shown (1 of 2)]
[im 1/26]
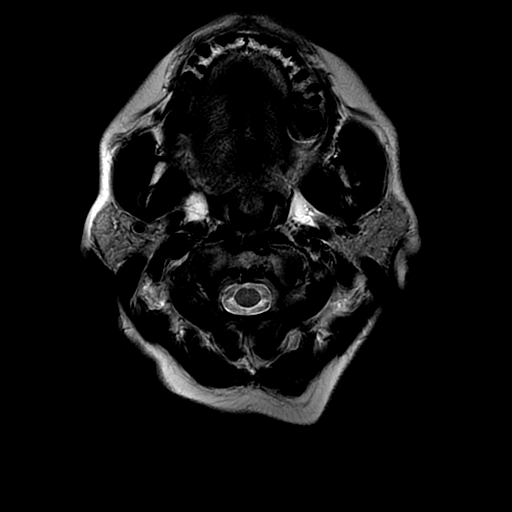

[Series 6: DWI · coronal · 5.0mm · 1.02mm/px · 4 of 66 slices shown (2 of 4)]
[im 1/66]
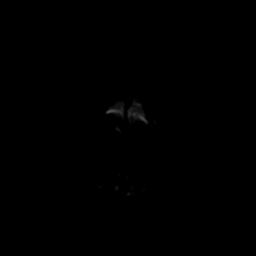
[im 22/66]
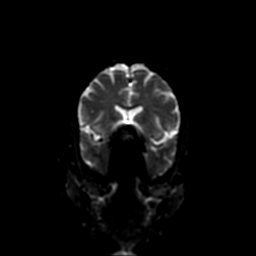
[im 44/66]
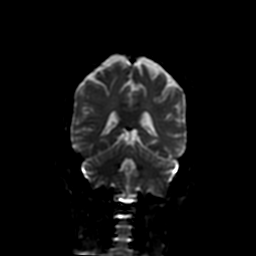
[im 66/66]
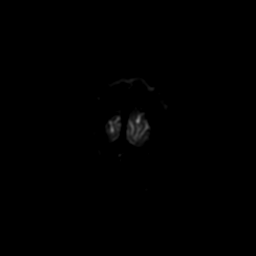

[Series 7: FLAIR · sagittal · 5.0mm · 0.47mm/px · 1 of 23 slices shown (2 of 2)]
[im 1/23]
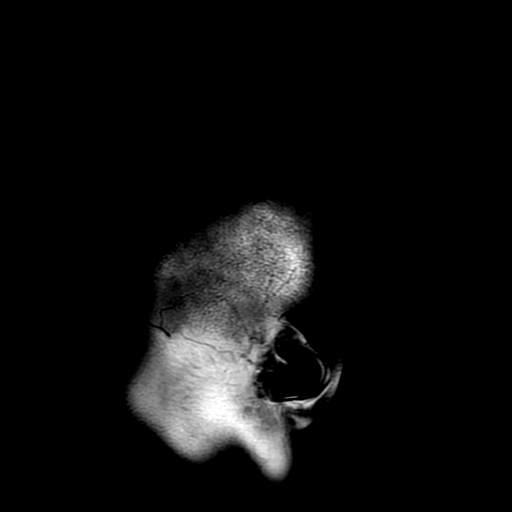

[Series 8: ax (id) 2 · axial · 1.4mm · 0.43mm/px · z∈[-126,-22]mm · 8 of 152 slices shown]
[im 1/152]
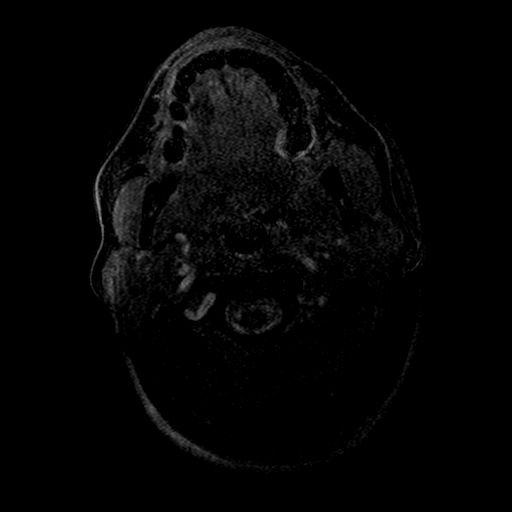
[im 19/152]
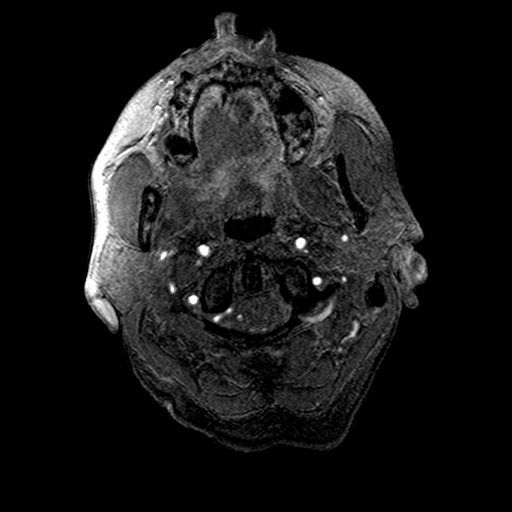
[im 38/152]
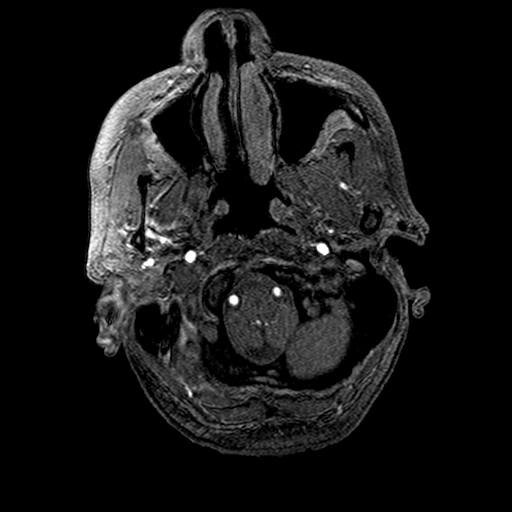
[im 57/152]
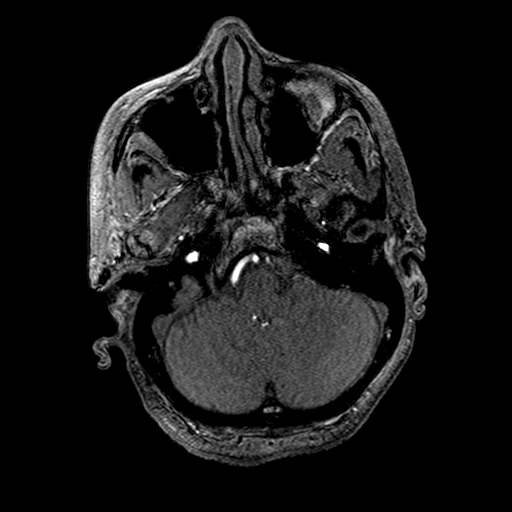
[im 95/152]
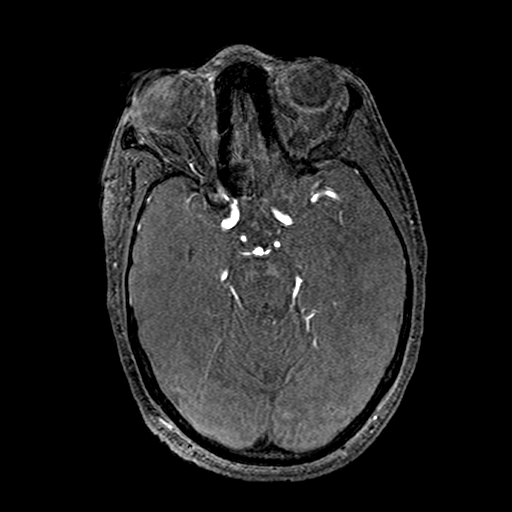
[im 114/152]
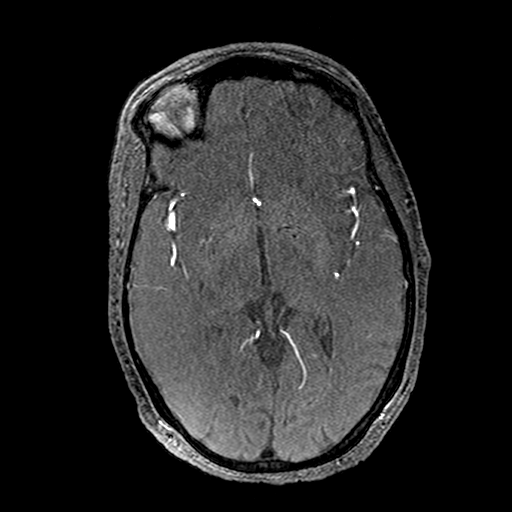
[im 133/152]
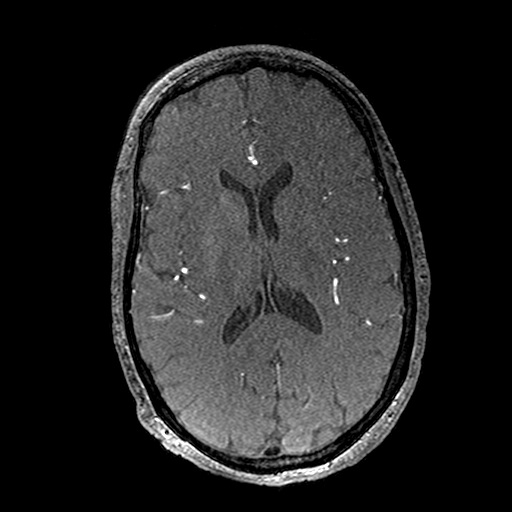
[im 152/152]
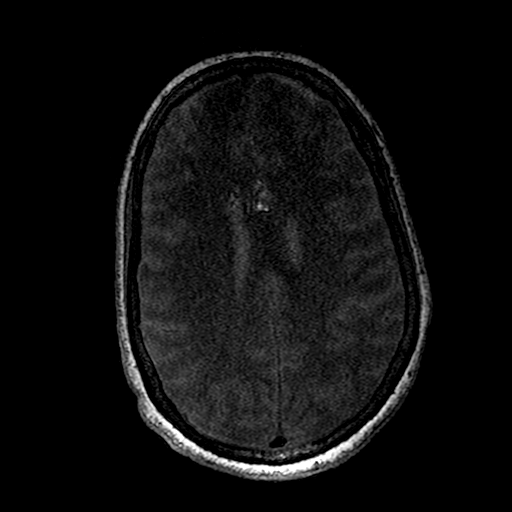

[Series 9: (person_name) · axial · 3.6mm · 0.47mm/px · z∈[-103,-87]mm · 2 of 152 slices shown]
[im 1/152]
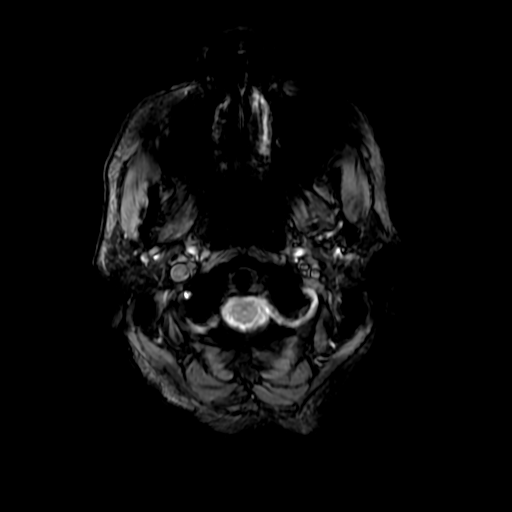
[im 19/152]
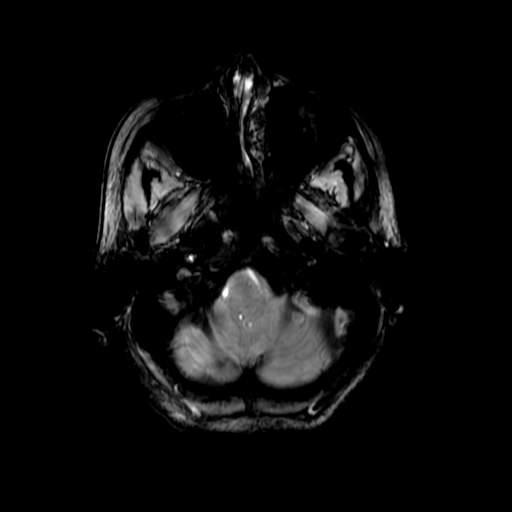

[Series 11: T2 · coronal · 5.0mm · 0.47mm/px · 2 of 29 slices shown (2 of 2)]
[im 1/29]
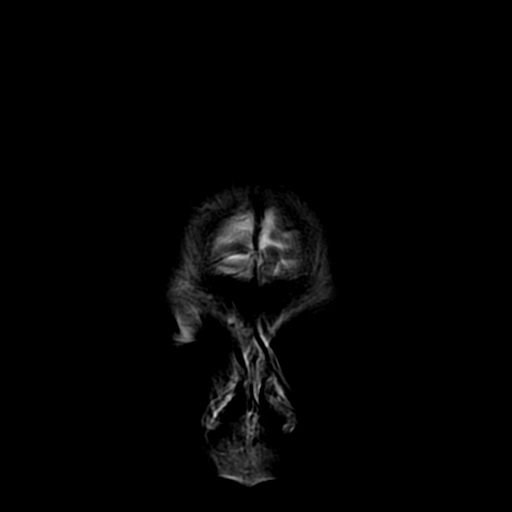
[im 29/29]
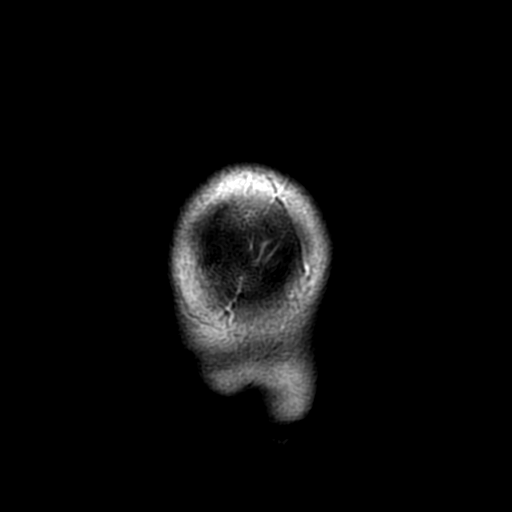

[Series 300: DWI · axial · 5.0mm · 1.02mm/px · z∈[-125,+21]mm · 2 of 31 slices shown (3 of 4)]
[im 1/31]
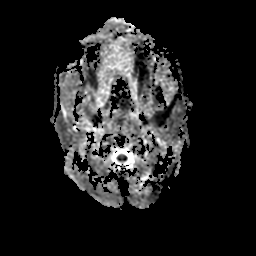
[im 31/31]
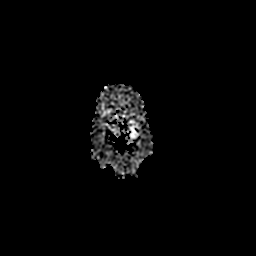

[Series 600: DWI · coronal · 5.0mm · 1.02mm/px · 2 of 33 slices shown (4 of 4)]
[im 1/33]
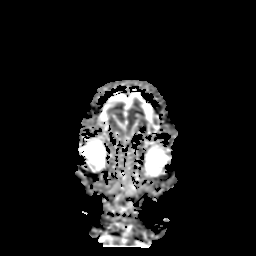
[im 33/33]
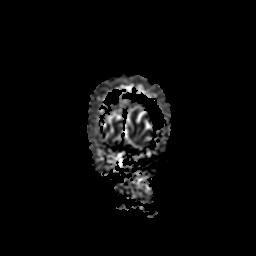

[26 of 48 positions shown; findings below may reference images not displayed]

FINDINGS: MRI HEAD FINDINGS

There is no evidence of acute infarct, intracranial hemorrhage,
mass, midline shift, or extra-axial fluid collection. Ventricles and
sulci are within normal limits for age. Scattered, small foci of T2
hyperintensity throughout the subcortical and deep cerebral white
matter are nonspecific but compatible with mild chronic small vessel
ischemic disease and similar to the prior MRI. Dural calcification
is noted near the vertex bilaterally.

Orbits are unremarkable. Paranasal sinuses and mastoid air cells are
clear. Major intracranial vascular flow voids are preserved.

MRA HEAD FINDINGS

Visualized distal vertebral arteries are patent with the right being
slightly larger than the left. PICA origins, a ICA origins, and SCA
origins are patent. Basilar artery is patent without stenosis. There
is a patent right posterior communicating artery. PCAs are
unremarkable.

Internal carotid arteries are patent from skullbase to carotid
termini without evidence of stenosis. There is a 2 x 1 mm
posterolaterally directed outpouching from the lateral aspect of the
left cavernous carotid artery. This is not clearly identified on the
prior MRA, however motion artifact on that examination may have
obscured this finding. ACAs and MCAs are unremarkable.
IMPRESSION: 1. No acute intracranial abnormality.
2. Mild chronic small vessel ischemic disease.
3. No major intracranial arterial occlusion or significant proximal
stenosis.
4. 2 mm left cavernous carotid aneurysm.

## 2016-10-12 IMAGING — MR MRI LUMBAR SPINE WITHOUT CONTRAST
5 series · 37 of 48 positions shown · non-contrast
Comparison: Radiographs 03/16/2013

CLINICAL DATA: Chronic low back pain worsening over the past month.
Bilateral lower extremity radiculopathy.

EXAM:
MRI LUMBAR SPINE WITHOUT CONTRAST
TECHNIQUE: Multiplanar, multisequence MR imaging of the lumbar spine was
performed. No intravenous contrast was administered.

[Series 2: T2 · sagittal · 4.0mm · 0.81mm/px · 6 of 17 slices shown (1 of 2)]
[im 1/17]
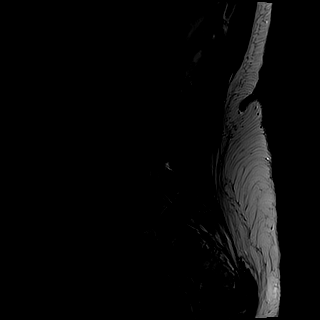
[im 4/17]
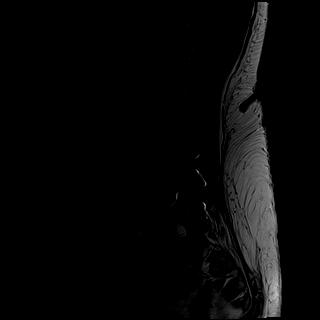
[im 7/17]
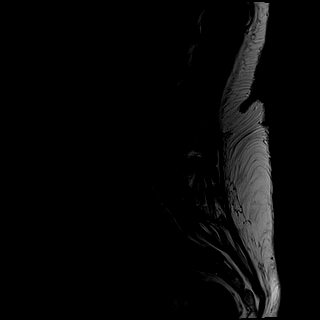
[im 10/17]
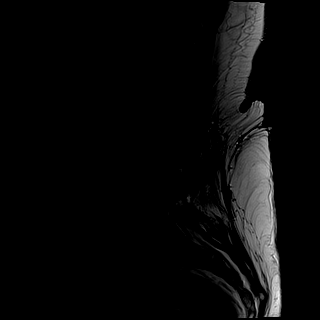
[im 13/17]
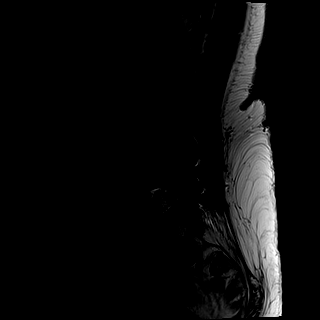
[im 17/17]
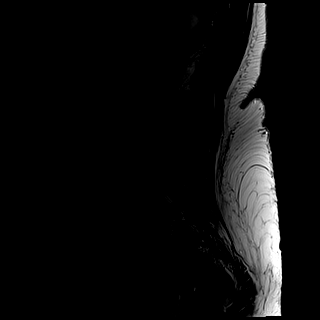

[Series 3: T1 · sagittal · 4.0mm · 0.81mm/px · 6 of 17 slices shown (1 of 2)]
[im 1/17]
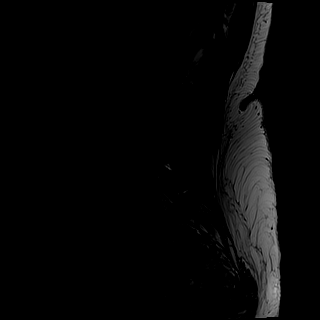
[im 4/17]
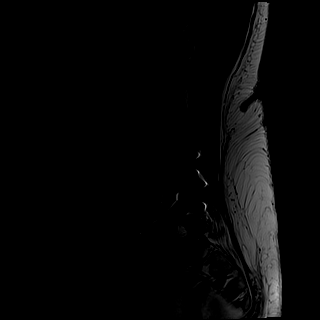
[im 7/17]
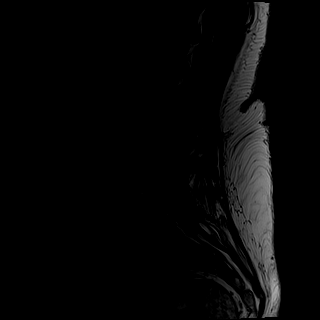
[im 10/17]
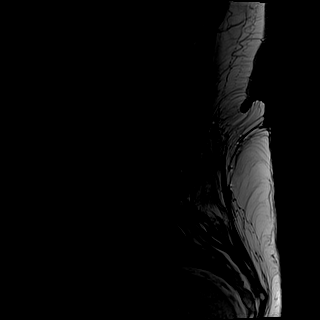
[im 13/17]
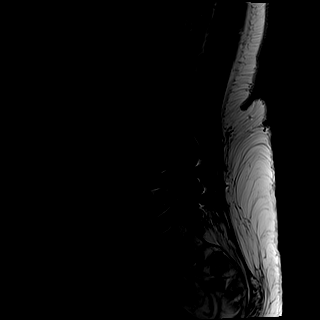
[im 17/17]
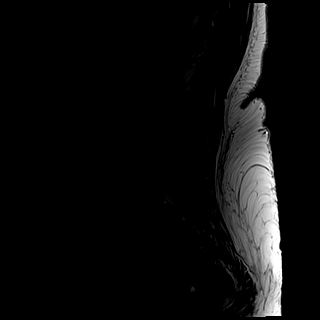

[Series 4: STIR · sagittal · 4.0mm · 1.02mm/px · 6 of 17 slices shown]
[im 1/17]
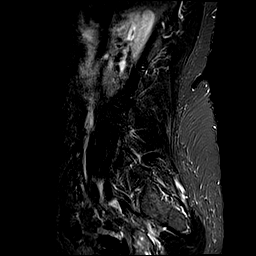
[im 4/17]
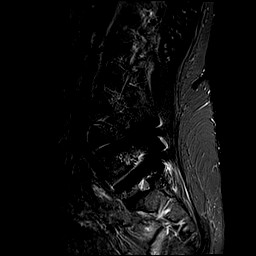
[im 7/17]
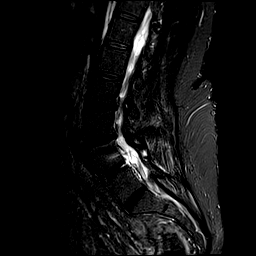
[im 10/17]
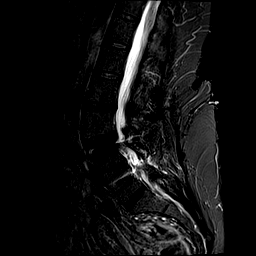
[im 13/17]
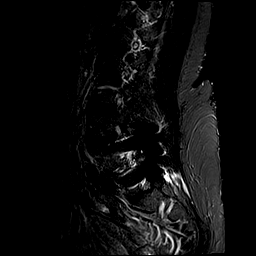
[im 17/17]
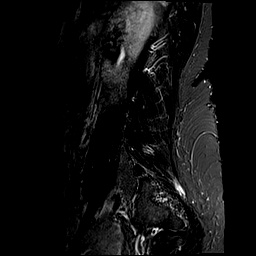

[Series 5: T2 · axial · 4.0mm · 0.78mm/px · z∈[-65,+128]mm · 10 of 39 slices shown (2 of 2)]
[im 1/39]
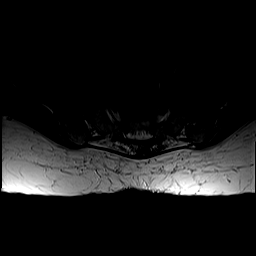
[im 3/39]
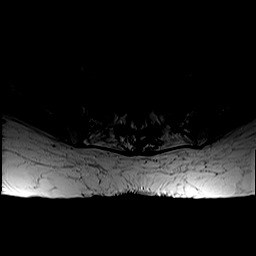
[im 6/39]
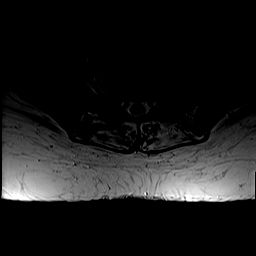
[im 11/39]
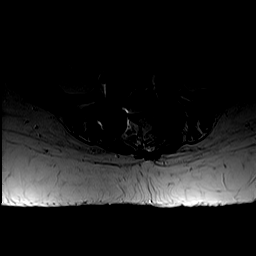
[im 17/39]
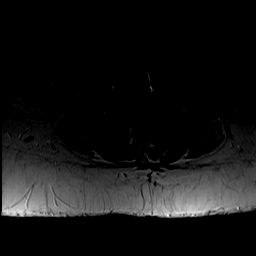
[im 20/39]
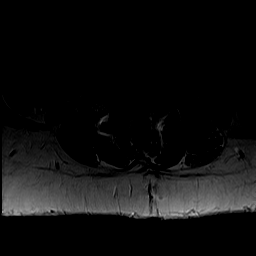
[im 22/39]
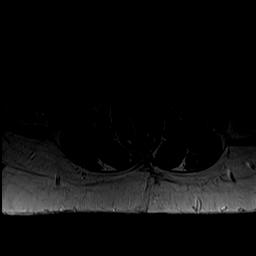
[im 28/39]
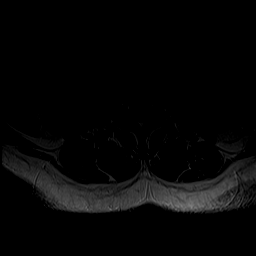
[im 33/39]
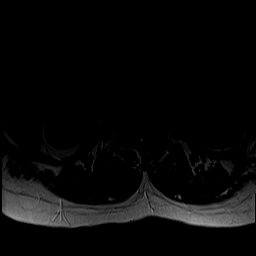
[im 39/39]
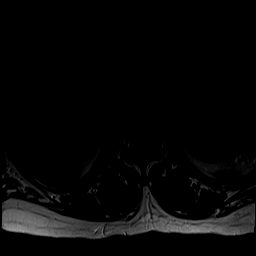

[Series 6: T1 · axial · 4.0mm · 0.39mm/px · z∈[-65,+128]mm · 9 of 39 slices shown (2 of 2)]
[im 1/39]
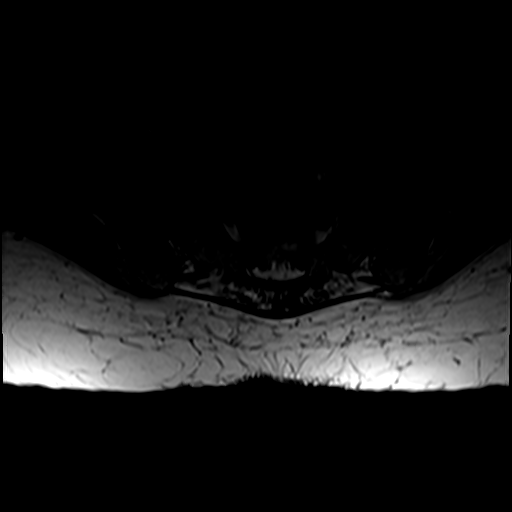
[im 6/39]
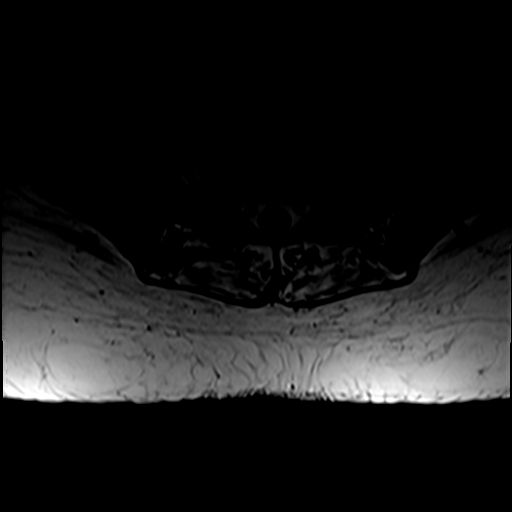
[im 11/39]
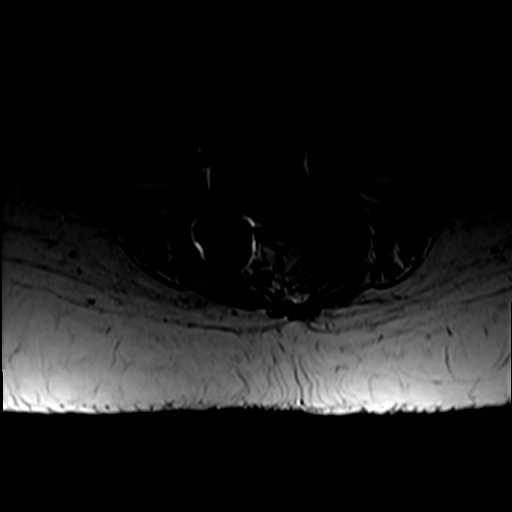
[im 17/39]
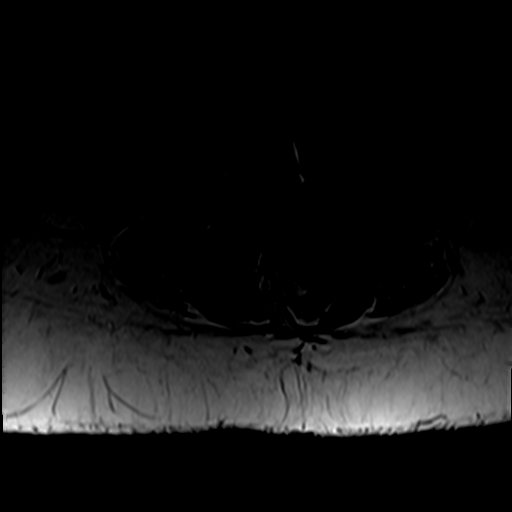
[im 20/39]
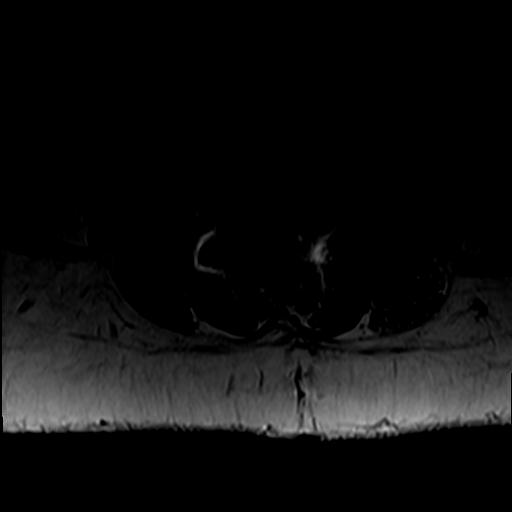
[im 22/39]
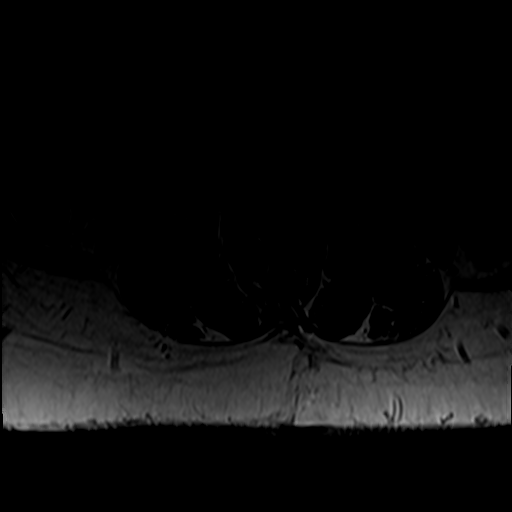
[im 28/39]
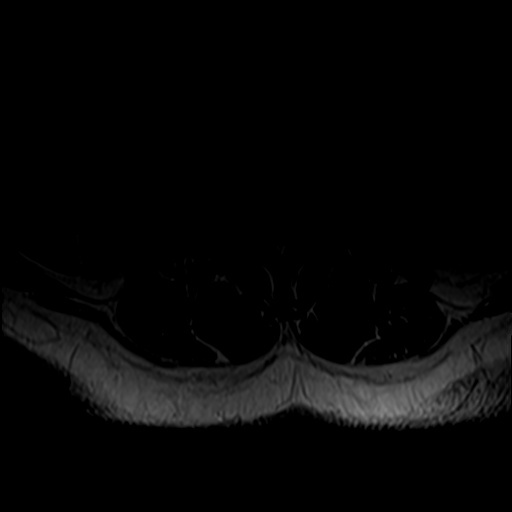
[im 33/39]
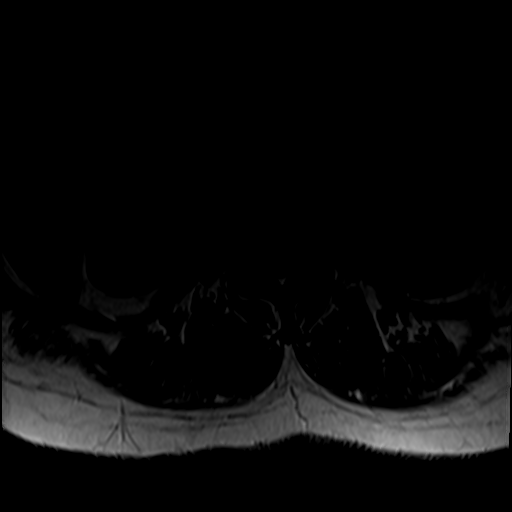
[im 39/39]
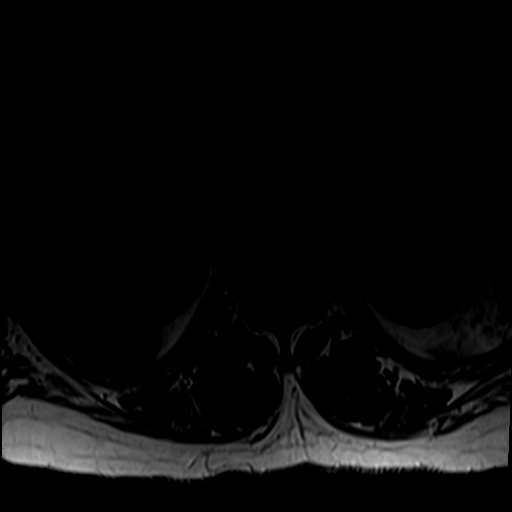

[37 of 48 positions shown; findings below may reference images not displayed]

FINDINGS: Normal alignment of the lumbar vertebral bodies. They demonstrate
normal marrow signal. There are surgical changes with posterior and
interbody fusion at L4-5. No complicating features are demonstrated.
The conus medullaris terminates at the bottom of T12.

No significant paraspinal or retroperitoneal findings.

T12-L1: There is a dilated nerve root sheath on the right side the
slightly expanded neural foramen. No disc protrusions, spinal or
foraminal stenosis.

L1-2:  No significant findings.

L2-3: Mild annular bulge and shallow central disc protrusion with
slight flattening of the ventral thecal sac but no significant
spinal or foraminal stenosis. Mild facet disease.

L3-4: Mild bulging annulus and moderate facet disease but no
significant spinal or foraminal stenosis.

L4-5: Posterior and interbody fusion changes. No spinal or foraminal
stenosis.

L5-S1: Annular fissure and mild annular bulge but no spinal or
foraminal stenosis. Moderate facet disease.
IMPRESSION: Postoperative changes at L4-5 with posterior and interbody fusion.
No complicating features. No spinal or foraminal stenosis.

Mild annular bulge and shallow central disc protrusion at L2-3 but
no neural compression, spinal or foraminal stenosis.

Annular fissure at L5-S1 and mild annular bulge but no spinal or
foraminal stenosis.

Dilated nerve root sheath on the right at T12-L1.

## 2016-10-18 ENCOUNTER — Other Ambulatory Visit: Payer: Self-pay

## 2016-10-18 DIAGNOSIS — M5416 Radiculopathy, lumbar region: Secondary | ICD-10-CM

## 2016-10-18 DIAGNOSIS — K5909 Other constipation: Secondary | ICD-10-CM

## 2016-10-18 NOTE — Telephone Encounter (Signed)
Fax from [facility] for refill for 90 day supply, please review-aa

## 2016-10-19 ENCOUNTER — Other Ambulatory Visit: Payer: Self-pay | Admitting: Family Medicine

## 2016-10-22 ENCOUNTER — Ambulatory Visit (INDEPENDENT_AMBULATORY_CARE_PROVIDER_SITE_OTHER): Payer: Medicare PPO | Admitting: Family Medicine

## 2016-10-22 ENCOUNTER — Encounter: Payer: Self-pay | Admitting: Family Medicine

## 2016-10-22 VITALS — BP 114/72 | HR 78 | Temp 98.3°F | Resp 14 | Wt 137.0 lb

## 2016-10-22 DIAGNOSIS — M549 Dorsalgia, unspecified: Secondary | ICD-10-CM

## 2016-10-22 DIAGNOSIS — R634 Abnormal weight loss: Secondary | ICD-10-CM

## 2016-10-22 DIAGNOSIS — G8929 Other chronic pain: Secondary | ICD-10-CM | POA: Diagnosis not present

## 2016-10-22 DIAGNOSIS — M5416 Radiculopathy, lumbar region: Secondary | ICD-10-CM | POA: Diagnosis not present

## 2016-10-22 DIAGNOSIS — G4709 Other insomnia: Secondary | ICD-10-CM | POA: Diagnosis not present

## 2016-10-22 MED ORDER — METFORMIN HCL 500 MG PO TABS: 500.0000 mg | ORAL_TABLET | Freq: Two times a day (BID) | ORAL | 3 refills | Status: DC

## 2016-10-22 MED ORDER — MIRTAZAPINE 30 MG PO TABS: 30.0000 mg | ORAL_TABLET | Freq: Every day | ORAL | 12 refills | Status: DC

## 2016-10-22 NOTE — Progress Notes (Signed)
Julie Acevedo  MRN: [id number] DOB: [birth year]  Subjective:  HPI  Patient is here for 1 month follow up. On her last visit for chronic pain Norco was started and refferral to pain clinic done. Patient states that someone from pain clinic called but she was not at home and did not have a number to call back. Provided patient with that number. She has been trying for the most part just to take Norco 1 tablet at bedtime and on some days has to take an additional tablet in the morning if pain is severe.  She does states that she has hard time sleeping at night she just lays there for a while looking at the ceiling. She has been taking Melatonin 10 mg at bedtime and is not sure if it is safe to increase that dose.  Wt Readings from Last 3 Encounters:  10/22/16 137 lb (62.1 kg)  09/27/16 141 lb (64 kg)  09/17/16 141 lb (64 kg)   Patient Active Problem List   Diagnosis Date Noted  . Pain at surgical site 08/30/2016  . Ventral incisional hernia 08/21/2016  . SBO (small bowel obstruction)   . Partial small bowel obstruction 04/23/2016  . Adjustment disorder with mixed anxiety and depressed mood 04/23/2016  . Memory difficulties 09/01/2015  . Arthritis 06/11/2015  . Back pain, chronic 06/11/2015  . HLD (hyperlipidemia) 06/11/2015  . Cannot sleep 06/11/2015  . L-S radiculopathy 06/11/2015  . Arthritis of knee, degenerative 06/11/2015  . B12 deficiency 06/11/2015  . 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 DM2 (diabetes mellitus, type 2) ([place]) 11/15/2012  . DM (diabetes mellitus) ([place]) 01/27/2012  . HTN (hypertension) 01/27/2012  . Adiposity 11/21/2009  . Avitaminosis D 11/07/2009  . Narrowing of intervertebral disc space 07/25/2009  . Diabetes ([place]) 07/25/2009  . Benign essential HTN 07/25/2009    Past Medical History:  Diagnosis Date  . Arthritis   . Diverticulitis   . DM (diabetes mellitus) ([place])   . HTN  (hypertension)   . Memory difficulties 09/01/2015  . Short-term memory loss     Social History   Social History  . Marital status: Married    Spouse name: N/A  . Number of children: 3  . Years of education: 14   Occupational History  . 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 Smoking status: Never Smoker  . Smokeless tobacco: Never Used  . Alcohol use No  . Drug use: No  . Sexual activity: Not on file   Other Topics Concern  . Not on file   Social History Narrative   Lives with husband, daughter and granddaughter.;   Patient drinks about 3 cups of caffeine daily.   Patient is left handed.     Outpatient Encounter Prescriptions as of 10/22/2016  Medication Sig  . amLODipine (NORVASC) 10 MG tablet TAKE 1 TABLET (10 MG TOTAL) BY MOUTH DAILY.  . cyclobenzaprine (FLEXERIL) 5 MG tablet Take 1 tablet (5 mg total) by mouth 3 (three) times daily as needed for muscle spasms.  . diazepam (VALIUM) 5 MG tablet TAKE 1 TABLET BY MOUTH EVERY 6 HOURS AS NEEDED FOR ANXIETY CAN TAKE 1/2-1 TABLET  . gabapentin (NEURONTIN) 100 MG capsule TAKE 1 CAPSULE (100 MG TOTAL) BY MOUTH 3 (THREE) TIMES DAILY.  . hydrochlorothiazide (HYDRODIURIL) 25 MG tablet Take 25 mg by mouth daily.   Marland Kitchen HYDROcodone-acetaminophen (NORCO/VICODIN) 5-325 MG tablet Take 1 tablet by  mouth 3 (three) times daily.  Marland Kitchen linaclotide (LINZESS) 290 MCG CAPS capsule Take 1 capsule (290 mcg total) by mouth daily before breakfast.  . Melatonin 10 MG TABS Take 10 mg by mouth at bedtime.  . metFORMIN (GLUCOPHAGE) 500 MG tablet TAKE 1 TABLET (500 MG TOTAL) BY MOUTH 2 (TWO) TIMES DAILY WITH A MEAL.   No facility-administered encounter medications on file as of 10/22/2016.     Allergies  Allergen Reactions  . Lotrel [Amlodipine Besy-Benazepril Hcl] Anaphylaxis  . Contrast Media [Iodinated Diagnostic Agents] Swelling and Other (See Comments)    Reaction:  Neck swelling   . Niacin Hives  . Sulfa Antibiotics Hives    Review of  Systems  Constitutional: Negative.   Eyes: Negative.   Respiratory: Negative.   Cardiovascular: Negative.   Gastrointestinal: Positive for abdominal pain (some discomfort in the lower right quadrant).  Musculoskeletal: Positive for back pain, joint pain and myalgias.  Skin: Negative.   Neurological: Negative.   Endo/Heme/Allergies: Negative.   Psychiatric/Behavioral: The patient has insomnia.     Objective:  BP 114/72   Pulse 78   Temp 98.3 F (36.8 C)   Resp 14   Wt 137 lb (62.1 kg)   BMI 24.27 kg/m   Physical Exam  Constitutional: She is oriented to person, place, and time and well-developed, well-nourished, and in no distress.  HENT:  Head: Normocephalic and atraumatic.  Eyes: Conjunctivae are normal. Pupils are equal, round, and reactive to light.  Neck: Normal range of motion. Neck supple.  Cardiovascular: Normal rate, regular rhythm, normal heart sounds and intact distal pulses.   No murmur heard. Pulmonary/Chest: Effort normal and breath sounds normal. No respiratory distress. She has no rales.  Abdominal: Soft. There is no tenderness. There is no rebound.  Musculoskeletal: She exhibits no edema or tenderness.  Neurological: She is alert and oriented to person, place, and time. No cranial nerve deficit. She exhibits normal muscle tone. GCS score is 15.  Skin: Skin is warm and dry.  Psychiatric: Mood, memory, affect and judgment normal.    Assessment and Plan :  1. Chronic back pain, unspecified back location, unspecified back pain laterality Stable. Advised patient to get in touch with pain clinic.  2. Lumbar radiculopathy  3. Other insomnia Start Mirtazapine. Re check in 1 to 2 months.  4.Unintentional weight loss Will try Mirtazapine and recheck on the next visit. 4 lbs weight loss in 1 month. 5. Chronic depression and anxiety/mild HPI, Exam and A&P transcribed under direction and in the presence of Miguel Aschoff, MD. I have done the exam and reviewed the  chart and it is accurate to the best of my knowledge. Miguel Aschoff M.D. [facility]

## 2016-10-25 MED ORDER — LINACLOTIDE 290 MCG PO CAPS: 290.0000 ug | ORAL_CAPSULE | Freq: Every day | ORAL | 3 refills | Status: DC

## 2016-10-25 MED ORDER — GABAPENTIN 100 MG PO CAPS: 100.0000 mg | ORAL_CAPSULE | Freq: Three times a day (TID) | ORAL | 3 refills | Status: DC

## 2016-11-30 ENCOUNTER — Other Ambulatory Visit: Payer: Self-pay | Admitting: Family Medicine

## 2016-11-30 DIAGNOSIS — I10 Essential (primary) hypertension: Secondary | ICD-10-CM

## 2016-12-03 ENCOUNTER — Telehealth: Payer: Self-pay | Admitting: Family Medicine
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
## 2016-12-03 NOTE — Telephone Encounter (Signed)
I do not understand the question.

## 2016-12-04 ENCOUNTER — Telehealth: Payer: Self-pay

## 2016-12-04 MED ORDER — DIAZEPAM 5 MG PO TABS: 5.0000 mg | ORAL_TABLET | ORAL | 0 refills | Status: DC | PRN
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
## 2016-12-04 NOTE — Telephone Encounter (Signed)
Diazpam 5 mg daily prn for PASSENGER --#10,no rf

## 2016-12-04 NOTE — Telephone Encounter (Signed)
Not a good idea. That is essentially like drinking and driving. Will not prescribe Valium for that. If she is riding and not driving that is fine. But not for her to drive.
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
## 2016-12-18 ENCOUNTER — Ambulatory Visit: Payer: Medicare PPO | Admitting: Urology

## 2016-12-18 VITALS — BP 113/70 | HR 54 | Ht 63.0 in | Wt 139.4 lb

## 2016-12-18 DIAGNOSIS — N8111 Cystocele, midline: Secondary | ICD-10-CM | POA: Diagnosis not present

## 2016-12-18 NOTE — Progress Notes (Signed)
12/18/2016 4:18 PM   Julie Acevedo Julie Acevedo 05/21/1951 [id number]  Referring provider: Jerrol Banana., MD [address] [place], [place] [address]  Chief Complaint  Patient presents with  . Follow-up    possible bladder prolapse     HPI: Dr Erlene Quan July 7758: 66 year old female who was incidentally found to have a bladder mass on cystoscopy during a procedure (anterior-posterior repair by Dr. Kenton Kingfisher). She was taken to the operating room for TURBT on 03/2015, bilateral retrograde pyelogram. This revealed a papillary lesion just beyond the LEFT UO which was removed using cold cup biopsy forceps and the bases fulgurated. Bilateral retrograde pyelograms were negative. 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   She returns today for office cystoscopy for surveillance in the setting of somewhat unusual pathology although benign.   No further follow-up was recommended by Dr. Erlene Quan  Today  The patient reported feeling of vaginal bulging sensation a few weeks ago. She does not reduce it. She is continent. She did not speak to Dr. Kenton Kingfisher  The patient voids every 3-4 hours and gets up twice a night to urinate. She has a poor flow for years. She can hesitate some.  She has had lower back surgery. She is on oral hypoglycemics. She does not get a lot of bladder infections. Her bowel movements are normal  Modifying factors: There are no other modifying factors  Associated signs and symptoms: There are no other associated signs and symptoms Aggravating and relieving factors: There are no other aggravating or relieving factors Severity: Moderate Duration: Persistent    PMH: Past Medical History:  Diagnosis Date  . Arthritis   . Diverticulitis   . DM (diabetes mellitus) ([place])   . HTN (hypertension)   . Memory difficulties 09/01/2015  . Short-term memory loss     Surgical History: Past Surgical History:  Procedure  Laterality Date  . ABDOMINAL HYSTERECTOMY     due to endometriosis-1 ovary left  . BREAST EXCISIONAL BIOPSY Right 1971   neg  . CARDIAC CATHETERIZATION  2000   no significant CAD per note of Dr Caryl Comes in 2013   . CHOLECYSTECTOMY    . EYE SURGERY    . FOOT SURGERY    . HAND SURGERY    . HEMICOLECTOMY    . INSERTION OF MESH N/A 08/21/2016   Procedure: INSERTION OF MESH;  Surgeon: Excell Seltzer, MD;  Location: [facility];  Service: General;  Laterality: N/A;  . LAPAROSCOPIC LYSIS OF ADHESIONS N/A 08/21/2016   Procedure: LAPAROSCOPIC LYSIS OF ADHESIONS;  Surgeon: Excell Seltzer, MD;  Location: [facility];  Service: General;  Laterality: N/A;  . LUMBAR [place]    . ROUX-EN-Y GASTRIC BYPASS    . TONSILLECTOMY    . UPPER GI ENDOSCOPY  01/12/16   normal larynx, normal esophagus. gastric bypass with a normal-sized pouch and intact staple line, normal examined jejunum, otherwise exam was normal  . VENTRAL HERNIA REPAIR N/A 08/21/2016   Procedure: LAPAROSCOPIC VENTRAL HERNIA;  Surgeon: Excell Seltzer, MD;  Location: [facility];  Service: General;  Laterality: N/A;    Home Medications:  Allergies as of 12/18/2016      Reactions   Lotrel [amlodipine Besy-benazepril Hcl] Anaphylaxis   Contrast Media [iodinated Diagnostic Agents] Swelling, Other (See Comments)   Reaction:  Neck swelling    Niacin Hives   Sulfa Antibiotics Hives      Medication List       Accurate as of 12/18/16  4:18 PM. Always use your most recent med list.          amLODipine 10 MG tablet Commonly known as:  NORVASC TAKE 1 TABLET (10 MG TOTAL) BY MOUTH DAILY.   cyclobenzaprine 5 MG tablet Commonly known as:  FLEXERIL Take 1 tablet (5 mg total) by mouth 3 (three) times daily as needed for muscle spasms.   diazepam 5 MG tablet Commonly known as:  VALIUM Take 1 tablet (5 mg total) by mouth as needed for anxiety. as needed for PASSNEGER   gabapentin 100 MG capsule Commonly known as:  NEURONTIN Take 1 capsule (100 mg  total) by mouth 3 (three) times daily.   hydrochlorothiazide 25 MG tablet Commonly known as:  HYDRODIURIL Take 25 mg by mouth daily.   hydrochlorothiazide 25 MG tablet Commonly known as:  HYDRODIURIL TAKE 1 TABLET (25 MG TOTAL) BY MOUTH DAILY.   HYDROcodone-acetaminophen 5-325 MG tablet Commonly known as:  NORCO/VICODIN Take 1 tablet by mouth 3 (three) times daily.   linaclotide 290 MCG Caps capsule Commonly known as:  LINZESS Take 1 capsule (290 mcg total) by mouth daily before breakfast.   Melatonin 10 MG Tabs Take 10 mg by mouth at bedtime.   metFORMIN 500 MG tablet Commonly known as:  GLUCOPHAGE Take 1 tablet (500 mg total) by mouth 2 (two) times daily with a meal.   mirtazapine 30 MG tablet Commonly known as:  REMERON Take 1 tablet (30 mg total) by mouth at bedtime.       Allergies:  Allergies  Allergen Reactions  . Lotrel [Amlodipine Besy-Benazepril Hcl] Anaphylaxis  . Contrast Media [Iodinated Diagnostic Agents] Swelling and Other (See Comments)    Reaction:  Neck swelling   . Niacin Hives  . Sulfa Antibiotics Hives    Family History: Family History  Problem Relation Age of Onset  . Cancer Father 53    Lung  . Coronary artery disease Father 88  . COPD Mother   . Diabetes Mother   . Hypertension Mother   . Cancer Paternal Grandmother     Breast  . Breast cancer Paternal Grandmother   . Congestive Heart Failure Maternal Grandmother   . Emphysema Maternal Grandfather   . Heart attack Paternal Grandfather   . Cancer Paternal Aunt     Breast  . Breast cancer Paternal Aunt 28  . Breast cancer Paternal Aunt 102    Social History:  reports that she has never smoked. She has never used smokeless tobacco. She reports that she does not drink alcohol or use drugs.  ROS:                                        Physical Exam: BP 113/70   Pulse (!) 54   Ht 5\' 3"  (1.6 m)   Wt 139 lb 6.4 oz (63.2 kg)   BMI 24.69 kg/m     Constitutional:  Alert and oriented, No acute distress. HEENT: [place] AT, moist mucus membranes.  Trachea midline, no masses. Cardiovascular: No clubbing, cyanosis, or edema. Respiratory: Normal respiratory effort, no increased work of breathing. GI: Abdomen is soft, nontender, nondistended, no abdominal masses GU: No CVA tenderness. Small grade 3 cystocele with central defect. Vaginal length descended from approximately 8 cm to 5 cm. She had mild vaginal shortening. She had no rectocele. She had modest hyper mobility of the bladder neck and negative cough test Skin:  No rashes, bruises or suspicious lesions. Lymph: No cervical or inguinal adenopathy. Neurologic: Grossly intact, no focal deficits, moving all 4 extremities. Psychiatric: Normal mood and affect.  Laboratory Data: Lab Results  Component Value Date   WBC 8.8 09/27/2016   HGB 10.7 (L) 09/27/2016   HCT 31.5 (L) 09/27/2016   MCV 80.9 09/27/2016   PLT 290 09/27/2016    Lab Results  Component Value Date   CREATININE 1.20 (H) 09/27/2016    No results found for: PSA  No results found for: TESTOSTERONE  Lab Results  Component Value Date   HGBA1C 5.5 08/14/2016    Urinalysis    Component Value Date/Time   COLORURINE AMBER (A) 04/24/2016 0647   APPEARANCEUR HAZY (A) 04/24/2016 0647   APPEARANCEUR Clear 06/28/2015 1055   LABSPEC 1.017 04/24/2016 0647   LABSPEC 1.016 02/17/2014 2031   PHURINE 5.0 04/24/2016 0647   GLUCOSEU NEGATIVE 04/24/2016 0647   GLUCOSEU Negative 02/17/2014 2031   HGBUR 1+ (A) 04/24/2016 0647   BILIRUBINUR NEGATIVE 04/24/2016 0647   BILIRUBINUR Negative 06/28/2015 1055   BILIRUBINUR Negative 02/17/2014 2031   KETONESUR TRACE (A) 04/24/2016 0647   PROTEINUR NEGATIVE 04/24/2016 0647   UROBILINOGEN 0.2 11/15/2012 1507   NITRITE POSITIVE (A) 04/24/2016 0647   LEUKOCYTESUR 1+ (A) 04/24/2016 0647   LEUKOCYTESUR Negative 06/28/2015 1055   LEUKOCYTESUR Negative 02/17/2014 2031    Pertinent  Imaging: none  Assessment & Plan:  The patient has mild voiding dysfunction and mild nocturia. She has recurrent prolapse symptoms. The patient had an anterior and posterior repair and cystoscopy [place] 25th 2016 utilizing 0 Vicryl suture.  Today she had a small grade 3 cystocele and mild loss of vaginal length. If she ever had surgery she would likely best benefit from a transvaginal vault suspension with cystocele repair and graft. The role of urodynamics was discussed and a picture was drawn.  She was a bit tender today on examination that her tissues actually looked very healthy  It would likely be prudent to re-cystoscoped the patient before she ever had anesthesia  There are no diagnoses linked to this encounter.  Return in about 4 weeks (around 01/15/2017) for order UDS [place] and see me after.  Reece Packer, MD  [facility] [address], [place], [place] [address] [phone]

## 2016-12-24 ENCOUNTER — Ambulatory Visit (INDEPENDENT_AMBULATORY_CARE_PROVIDER_SITE_OTHER): Payer: Medicare PPO | Admitting: Family Medicine

## 2016-12-24 VITALS — BP 114/72 | HR 76 | Temp 97.7°F | Resp 16 | Wt 136.0 lb

## 2016-12-24 DIAGNOSIS — R10816 Epigastric abdominal tenderness: Secondary | ICD-10-CM | POA: Diagnosis not present

## 2016-12-24 MED ORDER — OMEPRAZOLE 20 MG PO CPDR: 20.0000 mg | DELAYED_RELEASE_CAPSULE | Freq: Every day | ORAL | 3 refills | Status: DC

## 2016-12-24 NOTE — Progress Notes (Signed)
Julie Acevedo  MRN: [id number] DOB: 14-Jan-1951  Subjective:  HPI   The patient is a 66 year old female who presents for follow up after starting on Mirtazepine for insomnia.  The patient states she took the Mirtazepine for about 2 months and did not see any improvement in her insomnia.  She is currently taking Melatonin 10 mg and stated this is controlling her symptoms.   She states she has gone from 2 hours of sleep per night to 5 hours of sleep per night.    The patient is also here to follow up on unintentional weight loss.  She has lost 3 more pounds since her last visit.  She states she is eating because she knows she has to eat but, she states she does not get hungry.  She eats mostly bananas and grapes.  She states that since doing this she has been having regular bowel movements now too.  She is no longer taking the Linzess.  She states her anxiety is good.  She relates that she was having her heart racing and she was seen by Dr Clayborn Bigness.  He did a 48 hour holter and a cardiac echo.  She was placed on Metoprolol and states that she feels much better now.   The patient is scheduled to have thyroid biopsy on 2//18.   Patient Active Problem List   Diagnosis Date Noted  . Pain at surgical site 08/30/2016  . Ventral incisional hernia 08/21/2016  . SBO (small bowel obstruction)   . Partial small bowel obstruction 04/23/2016  . Adjustment disorder with mixed anxiety and depressed mood 04/23/2016  . Memory difficulties 09/01/2015  . Arthritis 06/11/2015  . Back pain, chronic 06/11/2015  . HLD (hyperlipidemia) 06/11/2015  . Cannot sleep 06/11/2015  . L-S radiculopathy 06/11/2015  . Arthritis of knee, degenerative 06/11/2015  . B12 deficiency 06/11/2015  . 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 DM2 (diabetes mellitus, type 2) ([place]) 11/15/2012  . DM (diabetes mellitus) ([place]) 01/27/2012  . HTN (hypertension) 01/27/2012    . Adiposity 11/21/2009  . Avitaminosis D 11/07/2009  . Narrowing of intervertebral disc space 07/25/2009  . Diabetes ([place]) 07/25/2009  . Benign essential HTN 07/25/2009    Past Medical History:  Diagnosis Date  . Arthritis   . Diverticulitis   . DM (diabetes mellitus) ([place])   . HTN (hypertension)   . Memory difficulties 09/01/2015  . Short-term memory loss     Social History   Social History  . Marital status: Married    Spouse name: N/A  . Number of children: 3  . Years of education: 14   Occupational History  . 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 Smoking status: Never Smoker  . Smokeless tobacco: Never Used  . Alcohol use No  . Drug use: No  . Sexual activity: Not on file   Other Topics Concern  . Not on file   Social History Narrative   Lives with husband, daughter and granddaughter.;   Patient drinks about 3 cups of caffeine daily.   Patient is left handed.     Outpatient Encounter Prescriptions as of 12/24/2016  Medication Sig  . cyclobenzaprine (FLEXERIL) 5 MG tablet Take 1 tablet (5 mg total) by mouth 3 (three) times daily as needed for muscle spasms.  . diazepam (VALIUM) 5 MG tablet Take 1 tablet (5 mg total) by mouth as needed for anxiety. as  needed for PASSNEGER  . gabapentin (NEURONTIN) 100 MG capsule Take 1 capsule (100 mg total) by mouth 3 (three) times daily.  . hydrochlorothiazide (HYDRODIURIL) 25 MG tablet TAKE 1 TABLET (25 MG TOTAL) BY MOUTH DAILY.  Marland Kitchen HYDROcodone-acetaminophen (NORCO/VICODIN) 5-325 MG tablet Take 1 tablet by mouth 3 (three) times daily.  . Melatonin 10 MG TABS Take 10 mg by mouth at bedtime.  . metFORMIN (GLUCOPHAGE) 500 MG tablet Take 1 tablet (500 mg total) by mouth 2 (two) times daily with a meal.  . metoprolol succinate (TOPROL-XL) 25 MG 24 hr tablet Take 25 mg by mouth daily.  . [DISCONTINUED] metoprolol tartrate (LOPRESSOR) 25 MG tablet Take 25 mg by mouth 2 (two) times daily.  . [DISCONTINUED] amLODipine  (NORVASC) 10 MG tablet TAKE 1 TABLET (10 MG TOTAL) BY MOUTH DAILY. (Patient not taking: Reported on 12/18/2016)  . [DISCONTINUED] hydrochlorothiazide (HYDRODIURIL) 25 MG tablet Take 25 mg by mouth daily.   . [DISCONTINUED] linaclotide (LINZESS) 290 MCG CAPS capsule Take 1 capsule (290 mcg total) by mouth daily before breakfast.  . [DISCONTINUED] mirtazapine (REMERON) 30 MG tablet Take 1 tablet (30 mg total) by mouth at bedtime. (Patient not taking: Reported on 12/18/2016)   No facility-administered encounter medications on file as of 12/24/2016.     Allergies  Allergen Reactions  . Lotrel [Amlodipine Besy-Benazepril Hcl] Anaphylaxis  . Contrast Media [Iodinated Diagnostic Agents] Swelling and Other (See Comments)    Reaction:  Neck swelling   . Niacin Hives  . Sulfa Antibiotics Hives   Cognitive Testing - 6-CIT  Correct? Score   What year is it? yes 0 0 or 4  What month is it? yes 0 0 or 3  Memorize:    Pia Mau,  44,  [place],      What time is it? (within 1 hour) yes 0 0 or 3  Count backwards from 20 yes 0 0, 2, or 4  Name the months of the year yes 0 0, 2, or 4  Repeat name & address above yes 0 0, 2, 4, 6, 8, or 10       TOTAL SCORE  4/28   Interpretation:  Normal  Normal (0-7) Abnormal (8-28)    Review of Systems  Constitutional: Positive for weight loss. Negative for chills, fever and malaise/fatigue.  Eyes: Negative.   Respiratory: Negative for cough, shortness of breath and wheezing.   Cardiovascular: Negative for chest pain, palpitations, orthopnea, claudication and leg swelling.  Gastrointestinal: Positive for abdominal pain.  Neurological: Negative for dizziness, weakness and headaches.  Endo/Heme/Allergies: Negative.   Psychiatric/Behavioral: Positive for memory loss. Negative for depression, hallucinations, substance abuse and suicidal ideas. The patient is not nervous/anxious and does not have insomnia.         Objective:  BP 114/72 (BP Location:  Right Arm, Patient Position: Sitting, Cuff Size: Normal)   Pulse 76   Temp 97.7 F (36.5 C) (Oral)   Resp 16   Wt 136 lb (61.7 kg)   BMI 24.09 kg/m   Physical Exam  Constitutional: She is well-developed, well-nourished, and in no distress.  HENT:  Head: Normocephalic and atraumatic.  Eyes: Pupils are equal, round, and reactive to light.  Neck: Normal range of motion. Neck supple.  Cardiovascular: Normal rate, regular rhythm and normal heart sounds.   Pulmonary/Chest: Effort normal and breath sounds normal.  Skin: Skin is warm and dry.    Assessment and Plan :    1. Epigastric abdominal tenderness,  rebound tenderness presence not specified  - omeprazole (PRILOSEC) 20 MG capsule; Take 1 capsule (20 mg total) by mouth daily.  Dispense: 30 capsule; Refill: 3  Follow up in 1 month for recheck 2. Urinary incontinence She has an appointment with Dr. Vikki Ports 3. Weight loss 4. Chronic anxiety I think this is a major issue for the patient is contributing to the weight loss.  Reassess Next month.  HPI, Exam and A&P Transcribed under the direction and in the presence of Miguel Aschoff, Brooke Bonito., MD. Electronically Signed: Althea Charon, RMA I have done the exam and reviewed the chart and it is accurate to the best of my knowledge. Development worker, community has been used and  any errors in dictation or transcription are unintentional. Miguel Aschoff M.D. [facility]

## 2016-12-25 ENCOUNTER — Telehealth: Payer: Self-pay

## 2016-12-25 DIAGNOSIS — E119 Type 2 diabetes mellitus without complications: Secondary | ICD-10-CM

## 2016-12-25 DIAGNOSIS — Z0100 Encounter for examination of eyes and vision without abnormal findings: Principal | ICD-10-CM

## 2016-12-25 NOTE — Telephone Encounter (Signed)
ok 

## 2016-12-25 NOTE — Telephone Encounter (Signed)
Referral ordered. Renaldo Fiddler, CMA

## 2016-12-25 NOTE — Telephone Encounter (Signed)
Tammy with [facility] called stating pt has [facility] and needs a referral to [facility]. Pt has diabetes and needs an eye exam. Is it okay to order referral? Renaldo Fiddler, [place]

## 2017-01-09 ENCOUNTER — Other Ambulatory Visit: Payer: Self-pay | Admitting: Urology

## 2017-01-09 ENCOUNTER — Encounter: Payer: Self-pay | Admitting: Urology

## 2017-01-09 ENCOUNTER — Ambulatory Visit: Payer: Medicare PPO | Admitting: Urology

## 2017-01-09 VITALS — BP 112/74 | HR 76 | Ht 63.0 in | Wt 134.1 lb

## 2017-01-09 DIAGNOSIS — N8111 Cystocele, midline: Secondary | ICD-10-CM

## 2017-01-09 NOTE — Progress Notes (Signed)
01/09/2017 11:04 AM   Julie Acevedo 06-Feb-1951 [id number]  Referring provider: Jerrol Banana., MD [address] [place], [place] [address]  Chief Complaint  Patient presents with  . Follow-up    cystocele     HPI: Dr Erlene Quan July 4340: 66 year old female who was incidentally found to have a bladder mass on cystoscopy during a procedure (anterior-posterior repair by Dr. Kenton Kingfisher). She was taken to the operating room for TURBT on 03/2015, bilateral retrograde pyelogram. This revealed a papillary lesion just beyond the LEFT UO which was removed using cold cup biopsy forceps and the bases fulgurated. Bilateral retrograde pyelograms were negative. 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   No further follow-up was recommended by Dr. Erlene Quan  Last visit: The patient reported feeling of vaginal bulging sensation a few weeks ago. She does not reduce it. She is continent.   The patient voids every 3-4 hours and gets up twice a night to urinate. She has a poor flow for years. She can hesitate some.  Small grade 3 cystocele with central defect. Vaginal length descended from approximately 8 cm to 5 cm. She had mild vaginal shortening. She had no rectocele. She had modest hyper mobility of the bladder neck and negative cough test  The patient has mild voiding dysfunction and mild nocturia. She has recurrent prolapse symptoms. The patient had an anterior and posterior repair and cystoscopy Frebuary 25th 2016 utilizing 0 Vicryl suture.  She had a small grade 3 cystocele and mild loss of vaginal length. If she ever had surgery she would likely best benefit from a transvaginal vault suspension with cystocele repair and graft.   Today Frequency and prolapse symptoms stable. On urodynamics the patient's maximum bladder capacity was 312 mL. She did not leak with a Valsalva pressure of 79 cm of water. During voluntary voiding she  voided 220 mL with a maximum flow of 17 mL/s and a maximal voiding pressure of 26 cm of water. EMG activity was normal. The details of the urodynamics were signed and dictated  I drew the patient another picture. A transvaginal vault suspension with cystocele repair and graft versus pessary versus watchful waiting was discussed. Pros and cons and risks with sequelae were discussed in detail as outlined with my usual template. The risk of unmasking urinary incontinence was also discussed.    PMH: Past Medical History:  Diagnosis Date  . Arthritis   . Diverticulitis   . DM (diabetes mellitus) ([place])   . HTN (hypertension)   . Memory difficulties 09/01/2015  . Short-term memory loss     Surgical History: Past Surgical History:  Procedure Laterality Date  . ABDOMINAL HYSTERECTOMY     due to endometriosis-1 ovary left  . BREAST EXCISIONAL BIOPSY Right 1971   neg  . CARDIAC CATHETERIZATION  2000   no significant CAD per note of Dr Caryl Comes in 2013   . CHOLECYSTECTOMY    . EYE SURGERY    . FOOT SURGERY    . HAND SURGERY    . HEMICOLECTOMY    . INSERTION OF MESH N/A 08/21/2016   Procedure: INSERTION OF MESH;  Surgeon: Excell Seltzer, MD;  Location: [facility];  Service: General;  Laterality: N/A;  . LAPAROSCOPIC LYSIS OF ADHESIONS N/A 08/21/2016   Procedure: LAPAROSCOPIC LYSIS OF ADHESIONS;  Surgeon: Excell Seltzer, MD;  Location: [facility];  Service: General;  Laterality: N/A;  . LUMBAR [place]    . ROUX-EN-Y GASTRIC BYPASS    .  TONSILLECTOMY    . UPPER GI ENDOSCOPY  01/12/16   normal larynx, normal esophagus. gastric bypass with a normal-sized pouch and intact staple line, normal examined jejunum, otherwise exam was normal  . VENTRAL HERNIA REPAIR N/A 08/21/2016   Procedure: LAPAROSCOPIC VENTRAL HERNIA;  Surgeon: Excell Seltzer, MD;  Location: [facility];  Service: General;  Laterality: N/A;    Home Medications:  Allergies as of 01/09/2017      Reactions   Lotrel [amlodipine  Besy-benazepril Hcl] Anaphylaxis   Contrast Media [iodinated Diagnostic Agents] Swelling, Other (See Comments)   Reaction:  Neck swelling    Niacin Hives   Sulfa Antibiotics Hives      Medication List       Accurate as of 01/09/17 11:04 AM. Always use your most recent med list.          cyclobenzaprine 5 MG tablet Commonly known as:  FLEXERIL Take 1 tablet (5 mg total) by mouth 3 (three) times daily as needed for muscle spasms.   diazepam 5 MG tablet Commonly known as:  VALIUM Take 1 tablet (5 mg total) by mouth as needed for anxiety. as needed for PASSNEGER   gabapentin 100 MG capsule Commonly known as:  NEURONTIN Take 1 capsule (100 mg total) by mouth 3 (three) times daily.   hydrochlorothiazide 25 MG tablet Commonly known as:  HYDRODIURIL TAKE 1 TABLET (25 MG TOTAL) BY MOUTH DAILY.   HYDROcodone-acetaminophen 5-325 MG tablet Commonly known as:  NORCO/VICODIN Take 1 tablet by mouth 3 (three) times daily.   Melatonin 10 MG Tabs Take 10 mg by mouth at bedtime.   metFORMIN 500 MG tablet Commonly known as:  GLUCOPHAGE Take 1 tablet (500 mg total) by mouth 2 (two) times daily with a meal.   metoprolol succinate 25 MG 24 hr tablet Commonly known as:  TOPROL-XL Take 25 mg by mouth daily.   omeprazole 20 MG capsule Commonly known as:  PRILOSEC Take 1 capsule (20 mg total) by mouth daily.       Allergies:  Allergies  Allergen Reactions  . Lotrel [Amlodipine Besy-Benazepril Hcl] Anaphylaxis  . Contrast Media [Iodinated Diagnostic Agents] Swelling and Other (See Comments)    Reaction:  Neck swelling   . Niacin Hives  . Sulfa Antibiotics Hives    Family History: Family History  Problem Relation Age of Onset  . Cancer Father 17    Lung  . Coronary artery disease Father 48  . COPD Mother   . Diabetes Mother   . Hypertension Mother   . Cancer Paternal Grandmother     Breast  . Breast cancer Paternal Grandmother   . Congestive Heart Failure Maternal  Grandmother   . Emphysema Maternal Grandfather   . Heart attack Paternal Grandfather   . Cancer Paternal Aunt     Breast  . Breast cancer Paternal Aunt 23  . Breast cancer Paternal Aunt 82    Social History:  reports that she has never smoked. She has never used smokeless tobacco. She reports that she does not drink alcohol or use drugs.  ROS: UROLOGY Frequent Urination?: Yes Hard to postpone urination?: Yes Burning/pain with urination?: No Get up at night to urinate?: No Leakage of urine?: Yes Urine stream starts and stops?: No Trouble starting stream?: Yes Do you have to strain to urinate?: No Blood in urine?: No Urinary tract infection?: No Sexually transmitted disease?: No Injury to kidneys or bladder?: No Painful intercourse?: No Weak stream?: Yes Currently pregnant?: No Vaginal bleeding?: No  Last menstrual period?: n  Gastrointestinal Nausea?: No Vomiting?: No Indigestion/heartburn?: No Diarrhea?: No Constipation?: No  Constitutional Fever: No Night sweats?: No Weight loss?: No Fatigue?: No  Skin Skin rash/lesions?: No Itching?: No  Eyes Blurred vision?: No Double vision?: No  Ears/Nose/Throat Sore throat?: No Sinus problems?: No  Hematologic/Lymphatic Swollen glands?: No Easy bruising?: No  Cardiovascular Leg swelling?: No Chest pain?: No  Respiratory Cough?: No Shortness of breath?: No  Endocrine Excessive thirst?: No  Musculoskeletal Back pain?: Yes Joint pain?: Yes  Neurological Headaches?: No Dizziness?: No  Psychologic Depression?: No Anxiety?: No  Physical Exam: BP 112/74   Pulse 76   Ht 5\' 3"  (1.6 m)   Wt 134 lb 1.6 oz (60.8 kg)   BMI 23.75 kg/m   .  Laboratory Data: Lab Results  Component Value Date   WBC 8.8 09/27/2016   HGB 10.7 (L) 09/27/2016   HCT 31.5 (L) 09/27/2016   MCV 80.9 09/27/2016   PLT 290 09/27/2016    Lab Results  Component Value Date   CREATININE 1.20 (H) 09/27/2016    No results  found for: PSA  No results found for: TESTOSTERONE  Lab Results  Component Value Date   HGBA1C 5.5 08/14/2016    Urinalysis    Component Value Date/Time   COLORURINE AMBER (A) 04/24/2016 0647   APPEARANCEUR HAZY (A) 04/24/2016 0647   APPEARANCEUR Clear 06/28/2015 1055   LABSPEC 1.017 04/24/2016 0647   LABSPEC 1.016 02/17/2014 2031   PHURINE 5.0 04/24/2016 0647   GLUCOSEU NEGATIVE 04/24/2016 0647   GLUCOSEU Negative 02/17/2014 2031   HGBUR 1+ (A) 04/24/2016 0647   BILIRUBINUR NEGATIVE 04/24/2016 0647   BILIRUBINUR Negative 06/28/2015 1055   BILIRUBINUR Negative 02/17/2014 2031   KETONESUR TRACE (A) 04/24/2016 0647   PROTEINUR NEGATIVE 04/24/2016 0647   UROBILINOGEN 0.2 11/15/2012 1507   NITRITE POSITIVE (A) 04/24/2016 0647   LEUKOCYTESUR 1+ (A) 04/24/2016 0647   LEUKOCYTESUR Negative 06/28/2015 1055   LEUKOCYTESUR Negative 02/17/2014 2031    Pertinent Imaging: None   Assessment & Plan:   The patient is not interested and watchful waiting or a pessary. She is sexually active. In addition during our discussions we talked about relative vaginal shortening and sequelae I symptoms this will be her third vaginal operation. Vaginal pain also discussed. Mesh issues discussed. We were giggling a little bit after our discussion since she was not aware of these relative risks when she consented last time apparently. We both agreed that I would perform cystoscopy during the procedure and I had noted Dr. Cherrie Gauze previous recommendations on follow-up  She will hear from my scheduler  There are no diagnoses linked to this encounter.  No Follow-up on file.  Reece Packer, MD  [facility] [address], [place], [place] [address] [phone]

## 2017-01-11 ENCOUNTER — Telehealth: Payer: Self-pay | Admitting: Family Medicine

## 2017-01-11 DIAGNOSIS — R928 Other abnormal and inconclusive findings on diagnostic imaging of breast: Secondary | ICD-10-CM

## 2017-01-11 NOTE — Telephone Encounter (Signed)
Pt advised orders put in-aa

## 2017-01-11 NOTE — Telephone Encounter (Signed)
Pt is requesting orders for a mammogram be sent to [facility]. Pt stated that she contacted [facility] to schedule appt but b/c she has had Dx mammograms in the past [facility] advised her that they require an order for a Dx mammogram be sent from our office. Please advise. Thanks TNP

## 2017-01-25 ENCOUNTER — Other Ambulatory Visit: Payer: Self-pay | Admitting: Urology

## 2017-01-29 IMAGING — CT CT ABD-PELV W/O CM
3 of 4 series · 10 of 46 positions shown, 17 images · non-contrast
Comparison: Prior CT from 10/25/2009.

CLINICAL DATA: Initial evaluation for acute right flank pain for 1
week.

EXAM:
CT ABDOMEN AND PELVIS WITHOUT CONTRAST
TECHNIQUE: Multidetector CT imaging of the abdomen and pelvis was performed
following the standard protocol without IV contrast.

[Series 4: lung · axial · 0.70mm/px · z∈[-201,-101]mm · 6 of 29 slices shown, 11 images]
[im 5/29  soft-tissue]
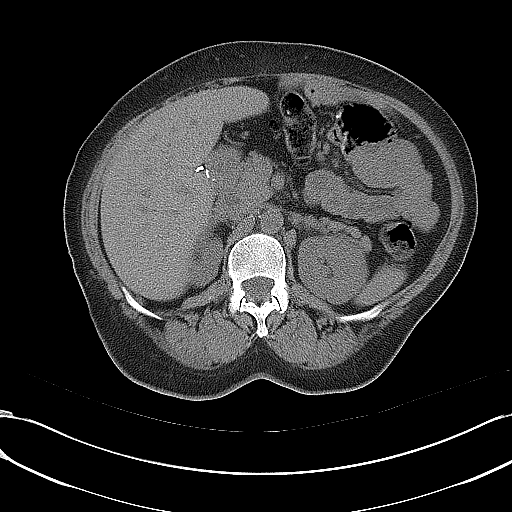
[im 5/29  bone]
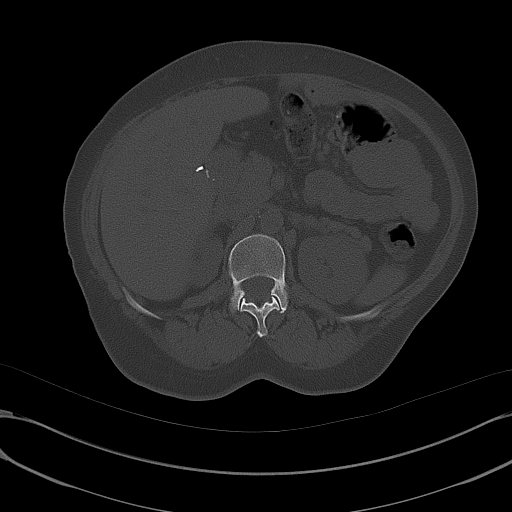
[im 9/29  soft-tissue]
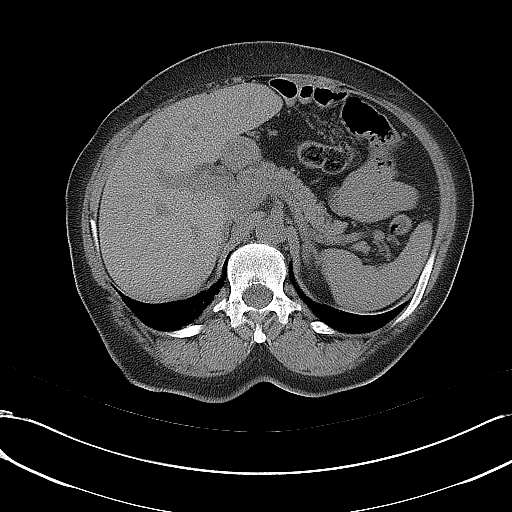
[im 13/29  soft-tissue]
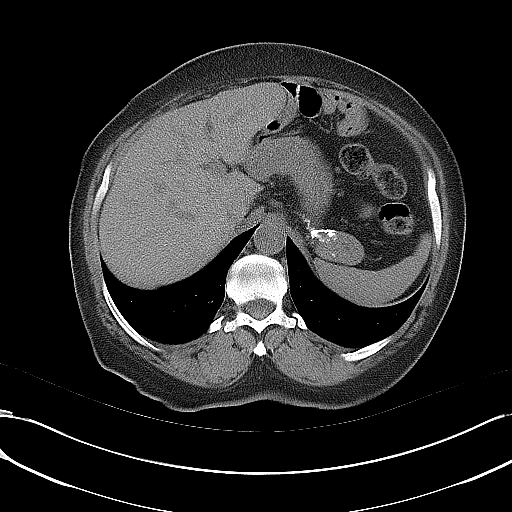
[im 13/29  lung]
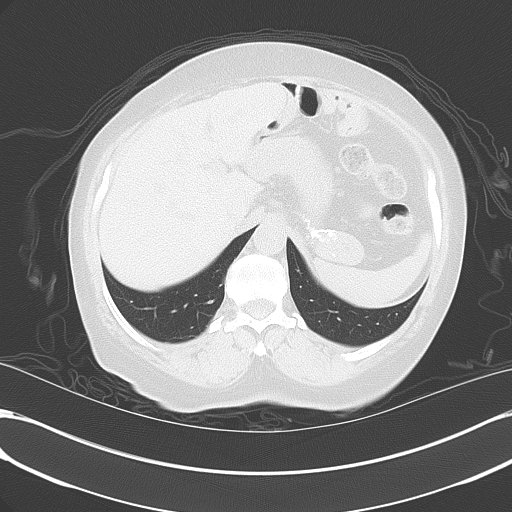
[im 17/29  soft-tissue]
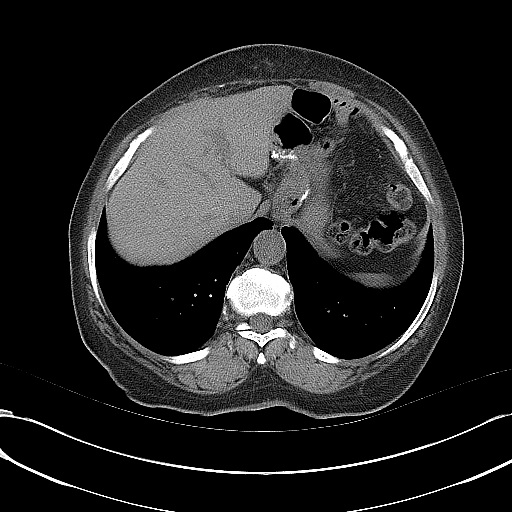
[im 17/29  lung]
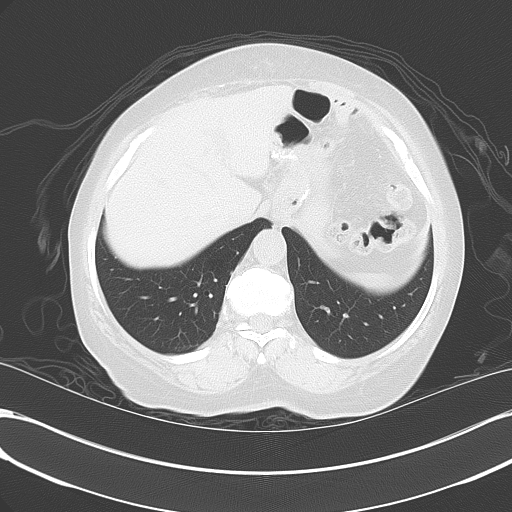
[im 21/29  soft-tissue]
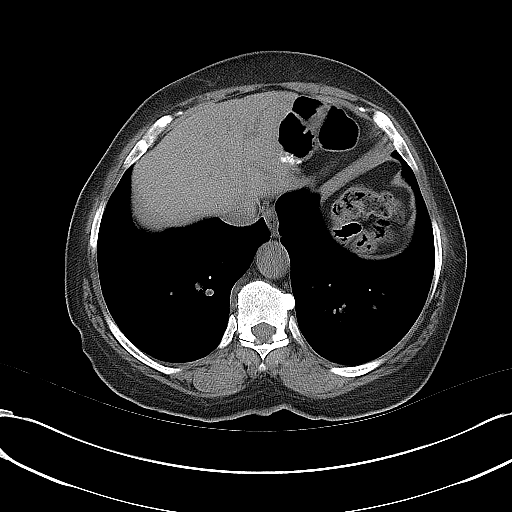
[im 21/29  lung]
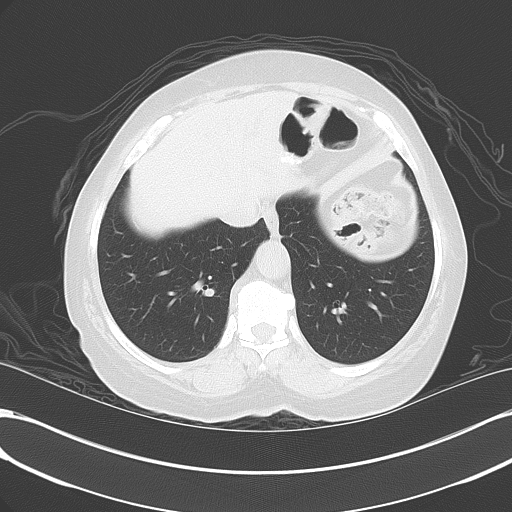
[im 25/29  soft-tissue]
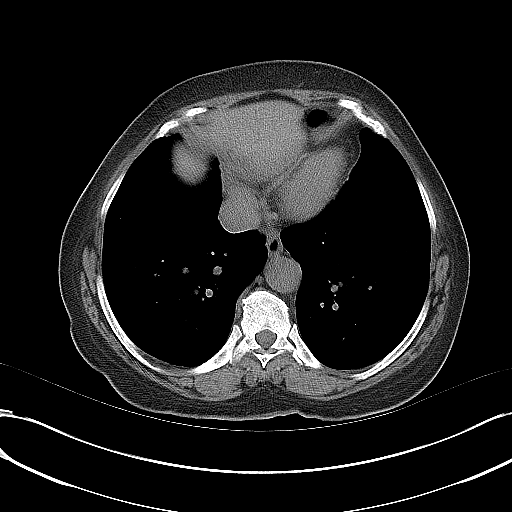
[im 25/29  lung]
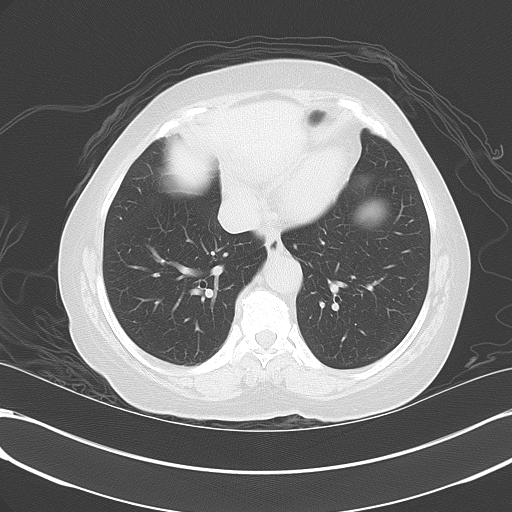

[Series 5: coronal · coronal · 0.68mm/px · 3 of 118 slices shown, 4 images]
[im 40/118  soft-tissue]
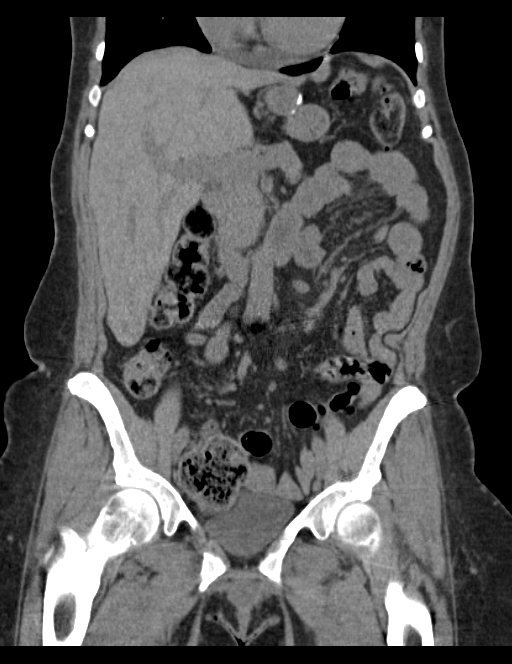
[im 53/118  soft-tissue]
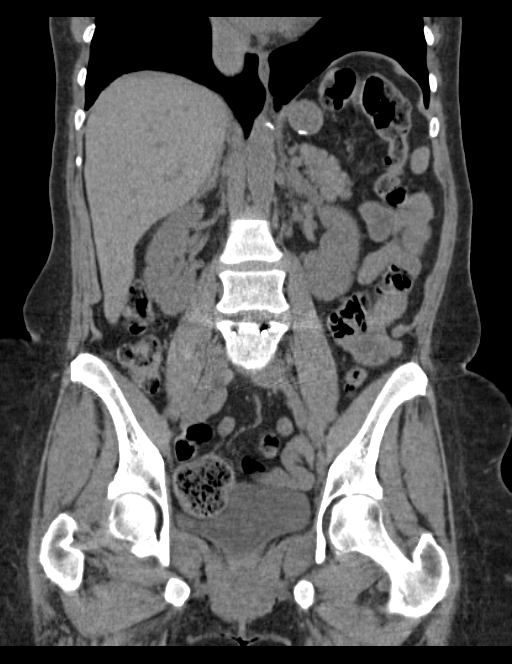
[im 53/118  bone]
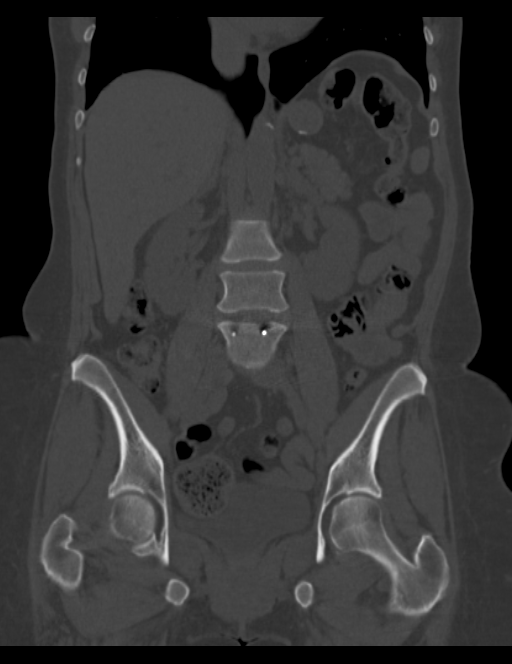
[im 66/118  soft-tissue]
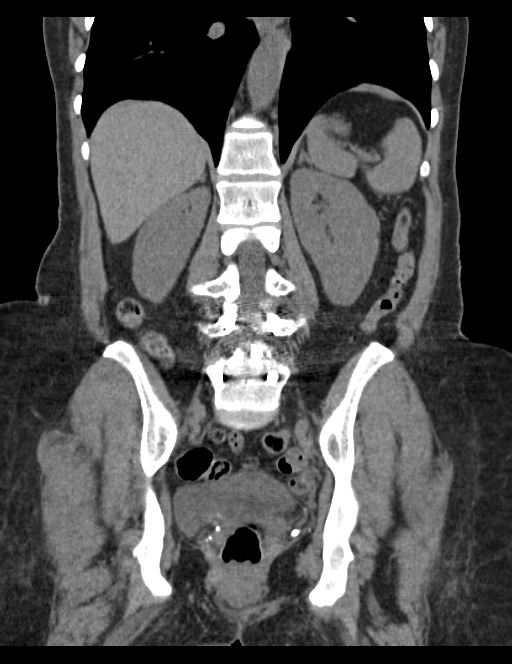

[Series 6: sagittal · sagittal · 0.58mm/px · 1 of 152 slices shown, 2 images]
[im 51/152  soft-tissue]
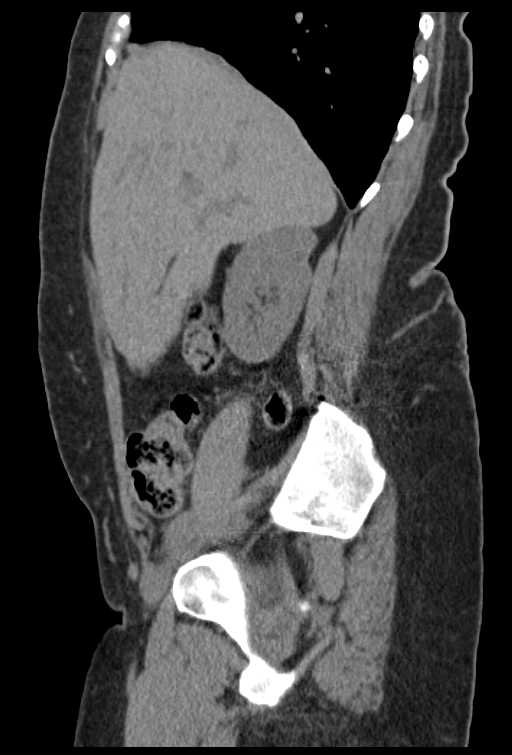
[im 51/152  bone]
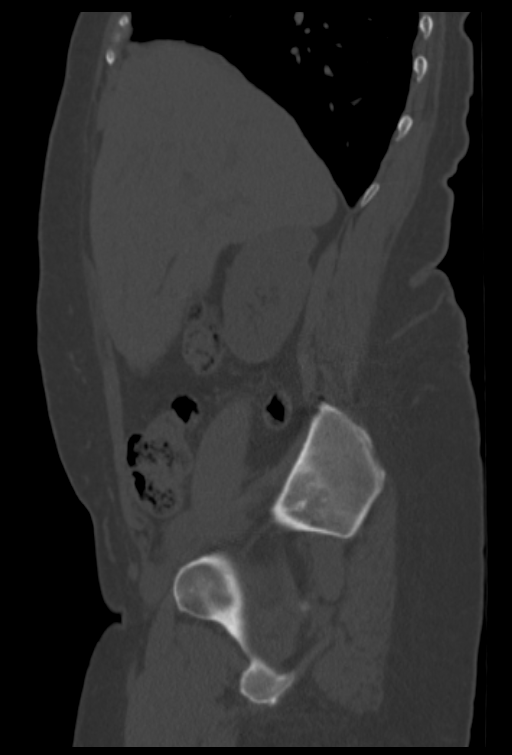

[10 of 46 positions shown; findings below may reference images not displayed]

FINDINGS: Visualized lung bases are clear. No pleural or pericardial effusion.

Limited noncontrast evaluation of the liver is unremarkable.
Gallbladder is absent. No biliary dilatation. Spleen, adrenal
glands, and pancreas within normal limits.

Kidneys are equal size without evidence of nephrolithiasis or
hydronephrosis. No stones seen along the course of either renal
collecting system. There is no hydroureter.

Postsurgical changes from probable Roux-en-Y gastric bypass noted.
No evidence for obstruction. No acute inflammatory changes seen
about the bowels.

Bladder within normal limits. Uterus and ovaries are not visualized.

No free air or fluid.  No adenopathy.

Multiple small fat containing paraumbilical hernia is noted without
associated inflammation.

Minimal scattered atheromatous plaque present within the
intra-abdominal aorta.

Patient is status post fusion at the L4-5 level. Hardware appears
well positioned without complication. Prominent bilateral facet
arthrosis present at these levels. No acute osseous abnormality. No
worrisome lytic or blastic osseous lesion.
IMPRESSION: 1. No CT evidence for nephrolithiasis or obstructive uropathy.
2. No other acute intra-abdominal or pelvic process.
3. Chronic changes as above.

## 2017-01-30 ENCOUNTER — Ambulatory Visit
Admission: RE | Admit: 2017-01-30 | Discharge: 2017-01-30 | Disposition: A | Discharge status: Home / Self Care | Payer: Medicare PPO | Source: Ambulatory Visit | Attending: Family Medicine | Admitting: Family Medicine

## 2017-01-30 DIAGNOSIS — R928 Other abnormal and inconclusive findings on diagnostic imaging of breast: Secondary | ICD-10-CM | POA: Diagnosis present

## 2017-01-30 DIAGNOSIS — R921 Mammographic calcification found on diagnostic imaging of breast: Secondary | ICD-10-CM | POA: Insufficient documentation

## 2017-02-06 ENCOUNTER — Ambulatory Visit: Payer: Self-pay | Admitting: Family Medicine

## 2017-02-07 IMAGING — CR DG CHEST 2V
1 series · 2 of 2 positions shown · non-contrast
Comparison: 01/17/2014

CLINICAL DATA: Preoperative evaluation for bladder surgery

EXAM:
CHEST  2 VIEW

[Series 1: dxr chest pa (or ap) and lateral · 0.14mm/px · 2 of 2 slices shown]
[im 1/2]
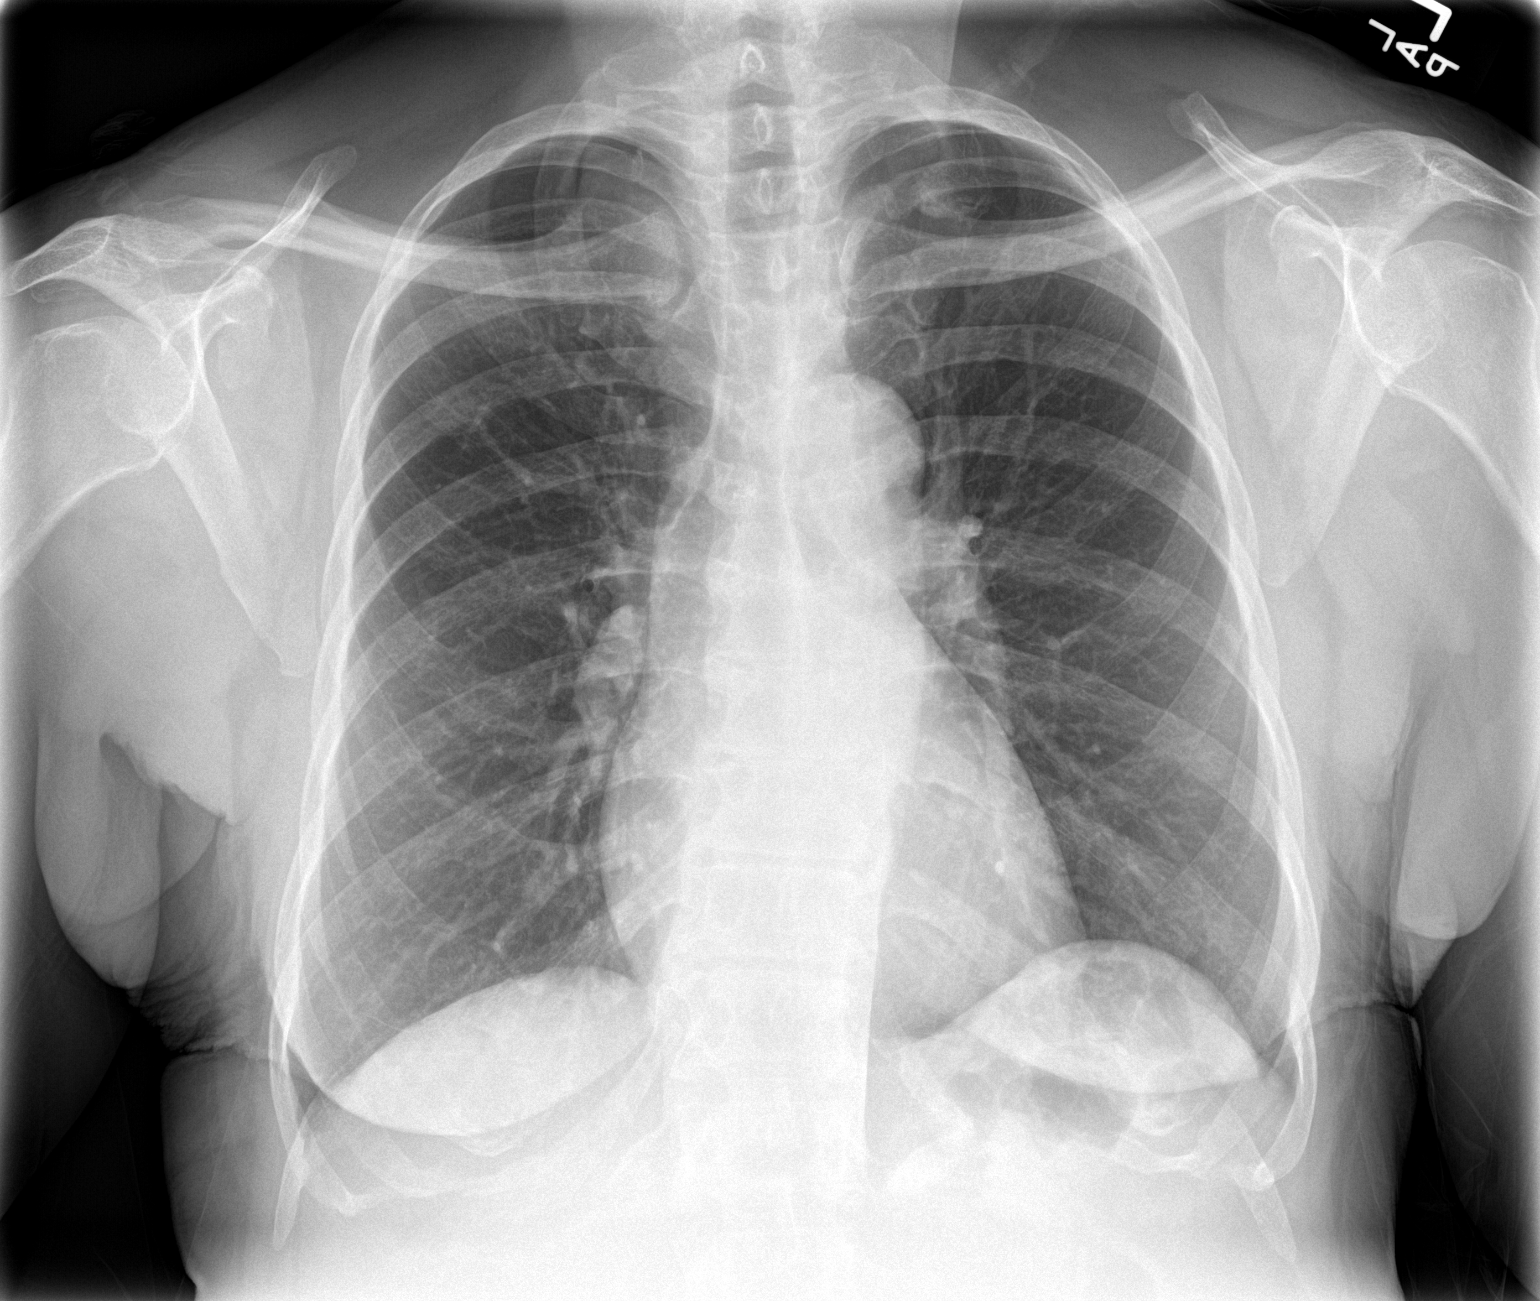
[im 2/2]
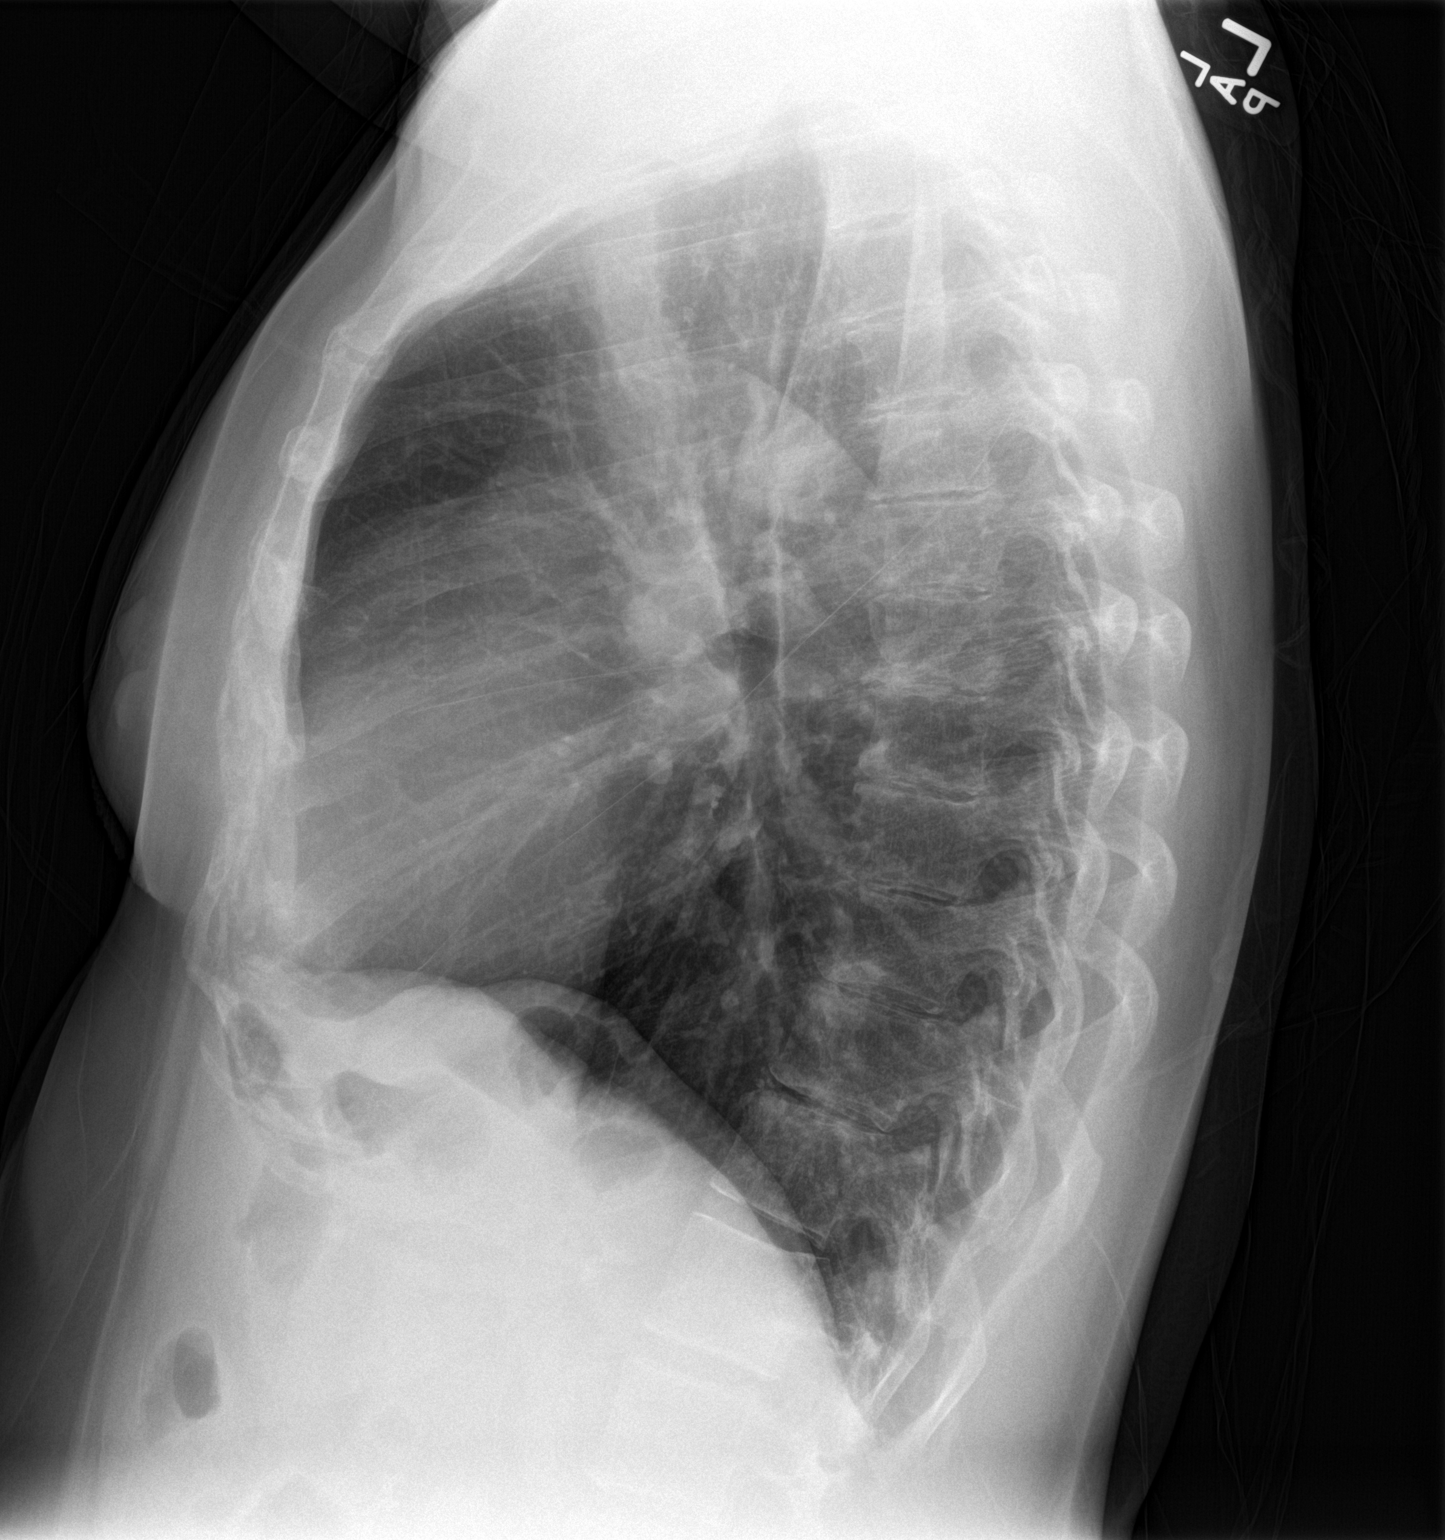

[2 of 2 positions shown; findings below may reference images not displayed]

FINDINGS: The heart size and mediastinal contours are within normal limits.
Both lungs are clear. The visualized skeletal structures are
unremarkable.
IMPRESSION: No active cardiopulmonary disease.

## 2017-02-28 ENCOUNTER — Encounter: Payer: Self-pay | Admitting: Family Medicine

## 2017-02-28 ENCOUNTER — Ambulatory Visit (INDEPENDENT_AMBULATORY_CARE_PROVIDER_SITE_OTHER): Payer: Medicare PPO | Admitting: Family Medicine

## 2017-02-28 VITALS — BP 100/62 | HR 60 | Temp 98.0°F | Resp 14 | Wt 135.0 lb

## 2017-02-28 DIAGNOSIS — Z0000 Encounter for general adult medical examination without abnormal findings: Secondary | ICD-10-CM

## 2017-02-28 DIAGNOSIS — Z9884 Bariatric surgery status: Secondary | ICD-10-CM | POA: Diagnosis not present

## 2017-02-28 DIAGNOSIS — R739 Hyperglycemia, unspecified: Secondary | ICD-10-CM

## 2017-02-28 DIAGNOSIS — I10 Essential (primary) hypertension: Secondary | ICD-10-CM | POA: Diagnosis not present

## 2017-02-28 DIAGNOSIS — M5441 Lumbago with sciatica, right side: Secondary | ICD-10-CM | POA: Diagnosis not present

## 2017-02-28 DIAGNOSIS — M9979 Connective tissue and disc stenosis of intervertebral foramina of abdomen and other regions: Secondary | ICD-10-CM

## 2017-02-28 DIAGNOSIS — G8929 Other chronic pain: Secondary | ICD-10-CM | POA: Diagnosis not present

## 2017-02-28 DIAGNOSIS — Z1211 Encounter for screening for malignant neoplasm of colon: Secondary | ICD-10-CM

## 2017-02-28 DIAGNOSIS — M5442 Lumbago with sciatica, left side: Secondary | ICD-10-CM

## 2017-02-28 DIAGNOSIS — G4709 Other insomnia: Secondary | ICD-10-CM | POA: Diagnosis not present

## 2017-02-28 DIAGNOSIS — N8110 Cystocele, unspecified: Secondary | ICD-10-CM

## 2017-02-28 LAB — IFOBT (OCCULT BLOOD): IFOBT: NEGATIVE

## 2017-02-28 MED ORDER — ZOLPIDEM TARTRATE 10 MG PO TABS: 10.0000 mg | ORAL_TABLET | Freq: Every evening | ORAL | 0 refills | Status: DC | PRN

## 2017-02-28 NOTE — Progress Notes (Signed)
Patient: Julie Acevedo, Female    DOB: 1951-11-22, 66 y.o.   MRN: [id number] Visit Date: 02/28/2017  Today's Provider: [name], MD   Chief Complaint  Patient presents with  . Medicare Wellness   Subjective:   Julie Acevedo is a 66 y.o. female who presents today for her Subsequent Annual Wellness Visit. She feels fairly well. She reports exercising not at this time-taking care of her husband who is paralyzed from waist down and has lost strength in his muscles so patient is having to help patient with everything daily. She reports she is sleeping fairly well. Husband is presently paralyzed awaiting spinal cord surgery. Immunization History  Administered Date(s) Administered  . Influenza,inj,Quad PF,36+ Mos 09/06/2016  . Influenza-Unspecified 09/15/2015  . Pneumococcal Polysaccharide-23 11/17/2012  . Tdap 10/02/2011, 09/27/2016  . Zoster 09/06/2016   Last colonoscopy 06/03/13 internal and external hemorrhoids, diverticulosis, otherwise normal. BMD 01/18/10  Pap smear 05/01/12 normal-had hysterectomy-has 1 ovary and 1 tube she thinks left Mammogram 01/30/17-right breast-re check in 6 months, bilateral exam was last time 07/27/16.  Review of Systems  Constitutional: Positive for fatigue.       Crying  HENT: Negative.   Eyes: Positive for itching.  Respiratory: Negative.   Cardiovascular: Negative.   Gastrointestinal: Negative.   Endocrine: Negative.   Genitourinary: Negative.   Musculoskeletal: Positive for arthralgias and back pain.  Skin: Negative.   Allergic/Immunologic: Negative.   Neurological: Negative.   Hematological: Negative.   Psychiatric/Behavioral: Positive for sleep disturbance.    Patient Active Problem List   Diagnosis Date Noted  . Pain at surgical site 08/30/2016  . Ventral incisional hernia 08/21/2016  . SBO (small bowel obstruction)   . Partial small bowel obstruction 04/23/2016  . Adjustment disorder with mixed anxiety and depressed mood 04/23/2016   . Memory difficulties 09/01/2015  . Arthritis 06/11/2015  . Back pain, chronic 06/11/2015  . HLD (hyperlipidemia) 06/11/2015  . Cannot sleep 06/11/2015  . L-S radiculopathy 06/11/2015  . Arthritis of knee, degenerative 06/11/2015  . B12 deficiency 06/11/2015  . 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 DM2 (diabetes mellitus, type 2) ([place]) 11/15/2012  . DM (diabetes mellitus) ([place]) 01/27/2012  . HTN (hypertension) 01/27/2012  . Adiposity 11/21/2009  . Avitaminosis D 11/07/2009  . Narrowing of intervertebral disc space 07/25/2009  . Diabetes ([place]) 07/25/2009  . Benign essential HTN 07/25/2009    Social History   Social History  . Marital status: Married    Spouse name: N/A  . Number of children: 3  . Years of education: 14   Occupational History  . 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 Smoking status: Never Smoker  . Smokeless tobacco: Never Used  . Alcohol use No  . Drug use: No  . Sexual activity: No   Other Topics Concern  . Not on file   Social History Narrative   Lives with husband, daughter and granddaughter.;   Patient drinks about 3 cups of caffeine daily.   Patient is left handed.     Past Surgical History:  Procedure Laterality Date  . ABDOMINAL HYSTERECTOMY     due to endometriosis-1 ovary left  . BREAST EXCISIONAL BIOPSY Right 1971   neg  . CARDIAC CATHETERIZATION  2000   no significant CAD per note of Dr Caryl Comes in 2013   . CHOLECYSTECTOMY    . EYE SURGERY    . FOOT SURGERY    .  HAND SURGERY    . HEMICOLECTOMY    . INSERTION OF MESH N/A 08/21/2016   Procedure: INSERTION OF MESH;  Surgeon: Excell Seltzer, MD;  Location: [facility];  Service: General;  Laterality: N/A;  . LAPAROSCOPIC LYSIS OF ADHESIONS N/A 08/21/2016   Procedure: LAPAROSCOPIC LYSIS OF ADHESIONS;  Surgeon: Excell Seltzer, MD;  Location: [facility];  Service: General;  Laterality: N/A;  . LUMBAR [place]     . ROUX-EN-Y GASTRIC BYPASS    . TONSILLECTOMY    . UPPER GI ENDOSCOPY  01/12/16   normal larynx, normal esophagus. gastric bypass with a normal-sized pouch and intact staple line, normal examined jejunum, otherwise exam was normal  . VENTRAL HERNIA REPAIR N/A 08/21/2016   Procedure: LAPAROSCOPIC VENTRAL HERNIA;  Surgeon: Excell Seltzer, MD;  Location: [facility];  Service: General;  Laterality: N/A;    Her family history includes Breast cancer in her paternal grandmother; Breast cancer (age of onset: 48) in her paternal aunt; Breast cancer (age of onset: 9) in her paternal aunt; COPD in her mother; Cancer in her paternal aunt and paternal grandmother; Cancer (age of onset: 52) in her father; Congestive Heart Failure in her maternal grandmother; Coronary artery disease (age of onset: 12) in her father; Diabetes in her mother; Emphysema in her maternal grandfather; Heart attack in her paternal grandfather; Hypertension in her mother.     Outpatient Encounter Prescriptions as of 02/28/2017  Medication Sig  . gabapentin (NEURONTIN) 100 MG capsule Take 1 capsule (100 mg total) by mouth 3 (three) times daily.  . hydrochlorothiazide (HYDRODIURIL) 25 MG tablet TAKE 1 TABLET (25 MG TOTAL) BY MOUTH DAILY.  . Melatonin 10 MG TABS Take 10 mg by mouth at bedtime.  . metFORMIN (GLUCOPHAGE) 500 MG tablet Take 1 tablet (500 mg total) by mouth 2 (two) times daily with a meal.  . metoprolol succinate (TOPROL-XL) 25 MG 24 hr tablet Take 25 mg by mouth daily.  . [DISCONTINUED] cyclobenzaprine (FLEXERIL) 5 MG tablet Take 1 tablet (5 mg total) by mouth 3 (three) times daily as needed for muscle spasms. (Patient not taking: Reported on 01/09/2017)  . [DISCONTINUED] diazepam (VALIUM) 5 MG tablet Take 1 tablet (5 mg total) by mouth as needed for anxiety. as needed for PASSNEGER (Patient not taking: Reported on 01/09/2017)  . [DISCONTINUED] HYDROcodone-acetaminophen (NORCO/VICODIN) 5-325 MG tablet Take 1 tablet by mouth 3  (three) times daily. (Patient not taking: Reported on 01/09/2017)  . [DISCONTINUED] omeprazole (PRILOSEC) 20 MG capsule Take 1 capsule (20 mg total) by mouth daily.   No facility-administered encounter medications on file as of 02/28/2017.     Allergies  Allergen Reactions  . Lotrel [Amlodipine Besy-Benazepril Hcl] Anaphylaxis  . Contrast Media [Iodinated Diagnostic Agents] Swelling and Other (See Comments)    Reaction:  Neck swelling   . Niacin Hives  . Sulfa Antibiotics Hives    Patient Care Team: Jerrol Banana., MD as PCP - General (Family Medicine)   Objective:   Vitals:  Vitals:   02/28/17 1047  BP: 100/62  Pulse: 60  Resp: 14  Temp: 98 F (36.7 C)  Weight: 135 lb (61.2 kg)    Physical Exam  Constitutional: She is oriented to person, place, and time. She appears well-developed and well-nourished.  HENT:  Head: Normocephalic and atraumatic.  Right Ear: External ear normal.  Left Ear: External ear normal.  Eyes: Conjunctivae are normal. Pupils are equal, round, and reactive to light.  Neck: Normal range of motion. Neck supple.  Cardiovascular: Normal rate, regular rhythm, normal heart sounds and intact distal pulses.   No murmur heard. Pulmonary/Chest: Effort normal and breath sounds normal. No respiratory distress. She has no wheezes. Right breast exhibits no inverted nipple and no mass. Left breast exhibits no inverted nipple and no mass. Breasts are symmetrical.  Abdominal: Soft. She exhibits no distension. There is no tenderness.  Genitourinary: Rectal exam shows guaiac negative stool.  Genitourinary Comments: Patient is s/p hysterectomy-has 1 ovary left, surgery coming up for bladder prolapse in April. Defer pap smear.  Musculoskeletal: She exhibits no edema.  Missing index finger of right hand.  Neurological: She is alert and oriented to person, place, and time.  Skin: No rash noted.  Psychiatric: She has a normal mood and affect. Her behavior is normal.  Thought content normal.    Activities of Daily Living In your present state of health, do you have any difficulty performing the following activities: 02/28/2017 08/21/2016  Hearing? N Y  Vision? N N  Difficulty concentrating or making decisions? [place]  Walking or climbing stairs? Y Y  Dressing or bathing? N N  Doing errands, shopping? N N  Some recent data might be hidden   Audit-C Alcohol Use Screening  Question Answer Points  How often do you have alcoholic drink? never 0  On days you do drink alcohol, how many drinks do you typically consume? none 0  How oftey will you drink 6 or more in a total? never 0  Total Score:  0   A score of 3 or more in women, and 4 or more in men indicates increased risk for alcohol abuse, EXCEPT if all of the points are from question 1.  Fall Risk Assessment Fall Risk  02/28/2017  Falls in the past year? No     Depression Screen PHQ 2/9 Scores 02/28/2017  PHQ - 2 Score 0  PHQ- 9 Score 5    Assessment & Plan:     Annual Wellness Visit  Reviewed patient's Family Medical History Reviewed and updated list of patient's medical providers Assessment of cognitive impairment was done Assessed patient's functional ability Established a written schedule for health screening [place] Completed and Reviewed 1. Medicare annual wellness visit, subsequent Mammogram up to date. Defer pap smear today-patient had hysterectomy and has bladder prolapse surgery coming up.  2. Colon cancer screening   3. Chronic bilateral low back pain with bilateral sciatica 4. Other insomnia  5. Narrowing of intervertebral disc space 6. Hyperglycemia  7. Benign essential HTN Stable.  8. Gastric bypass status for obesity  9. Female bladder prolapse Following Dr Kenton Kingfisher. Has surgery coming up to help with this issue.  HPI, Exam and A&P transcribed under direction and in the presence of Miguel Aschoff, MD. I have done the exam and reviewed the  chart and it is accurate to the best of my knowledge. Development worker, community has been used and  any errors in dictation or transcription are unintentional. Miguel Aschoff M.D. [facility]

## 2017-03-01 LAB — CBC WITH DIFFERENTIAL/PLATELET
Basophils Absolute: 0 10*3/uL (ref 0.0–0.2)
Basos: 0 %
EOS (ABSOLUTE): 0.1 10*3/uL (ref 0.0–0.4)
Eos: 1 %
Hematocrit: 33.3 % — ABNORMAL LOW (ref 34.0–46.6)
Hemoglobin: 10.4 g/dL — ABNORMAL LOW (ref 11.1–15.9)
Immature Grans (Abs): 0 10*3/uL (ref 0.0–0.1)
Immature Granulocytes: 0 %
Lymphocytes Absolute: 2.6 10*3/uL (ref 0.7–3.1)
Lymphs: 33 %
MCH: 26.9 pg (ref 26.6–33.0)
MCHC: 31.2 g/dL — ABNORMAL LOW (ref 31.5–35.7)
MCV: 86 fL (ref 79–97)
Monocytes Absolute: 0.6 10*3/uL (ref 0.1–0.9)
Monocytes: 7 %
Neutrophils Absolute: 4.5 10*3/uL (ref 1.4–7.0)
Neutrophils: 59 %
Platelets: 463 10*3/uL — ABNORMAL HIGH (ref 150–379)
RBC: 3.87 x10E6/uL (ref 3.77–5.28)
RDW: 15.8 % — ABNORMAL HIGH (ref 12.3–15.4)
WBC: 7.8 10*3/uL (ref 3.4–10.8)

## 2017-03-01 LAB — COMPREHENSIVE METABOLIC PANEL
ALT: 12 IU/L (ref 0–32)
AST: 18 IU/L (ref 0–40)
Albumin/Globulin Ratio: 1.4 (ref 1.2–2.2)
Albumin: 4.1 g/dL (ref 3.6–4.8)
Alkaline Phosphatase: 123 IU/L — ABNORMAL HIGH (ref 39–117)
BUN/Creatinine Ratio: 14 (ref 12–28)
BUN: 14 mg/dL (ref 8–27)
Bilirubin Total: 0.3 mg/dL (ref 0.0–1.2)
CO2: 28 mmol/L (ref 18–29)
Calcium: 9.6 mg/dL (ref 8.7–10.3)
Chloride: 102 mmol/L (ref 96–106)
Creatinine, Ser: 0.98 mg/dL (ref 0.57–1.00)
GFR calc Af Amer: 71 mL/min/{1.73_m2} (ref >59)
GFR calc non Af Amer: 62 mL/min/{1.73_m2} (ref >59)
Globulin, Total: 2.9 g/dL (ref 1.5–4.5)
Glucose: 83 mg/dL (ref 65–99)
Potassium: 4.5 mmol/L (ref 3.5–5.2)
Sodium: 144 mmol/L (ref 134–144)
Total Protein: 7 g/dL (ref 6.0–8.5)

## 2017-03-01 LAB — HEMOGLOBIN A1C
Est. average glucose Bld gHb Est-mCnc: 111 mg/dL
Hgb A1c MFr Bld: 5.5 % (ref 4.8–5.6)

## 2017-03-11 NOTE — Patient Instructions (Addendum)
Julie Acevedo  03/11/2017   Your procedure is scheduled on: 03-19-17  Report to [place]  Entrance take [facility]  elevators to 3rd floor to  [place] at [phone].   Call this number if you have problems the morning of surgery [phone]   Remember: ONLY 1 PERSON MAY GO WITH YOU TO SHORT STAY TO GET  READY MORNING OF [place].  Do not eat food or drink liquids :After Midnight.     Take these medicines the morning of surgery with A SIP OF WATER: gabapentin(neurontin), metoprolol(toprol xl)    DO NOT TAKE ANY DIABETIC MEDICATIONS DAY OF YOUR SURGERY                                You may not have any metal on your body including hair pins and              piercings  Do not wear jewelry, make-up, lotions, powders or perfumes, deodorant             Do not wear nail polish.  Do not shave  48 hours prior to surgery.              Men may shave face and neck.   Do not bring valuables to the hospital. [place].  Contacts, dentures or bridgework may not be worn into surgery.  Leave suitcase in the car. After surgery it may be brought to your room.              Please read over the following fact sheets you were given: _____________________________________________________________________  How to Manage Your Diabetes Before and After Surgery  Why is it important to control my blood sugar before and after surgery? . Improving blood sugar levels before and after surgery helps healing and can limit problems. . A way of improving blood sugar control is eating a healthy diet by: o  Eating less sugar and carbohydrates o  Increasing activity/exercise o  Talking with your doctor about reaching your blood sugar goals . High blood sugars (greater than 180 mg/dL) can raise your risk of infections and slow your recovery, so you will need to focus on controlling your diabetes during the weeks before surgery. . Make  sure that the doctor who takes care of your diabetes knows about your planned surgery including the date and location.  How do I manage my blood sugar before surgery? . Check your blood sugar at least 4 times a day, starting 2 days before surgery, to make sure that the level is not too high or low. o Check your blood sugar the morning of your surgery when you wake up and every 2 hours until you get to the Short Stay unit. . If your blood sugar is less than 70 mg/dL, you will need to treat for low blood sugar: o Do not take insulin. o Treat a low blood sugar (less than 70 mg/dL) with  cup of clear juice (cranberry or apple), 4 glucose tablets, OR glucose gel. o Recheck blood sugar in 15 minutes after treatment (to make sure it is greater than 70 mg/dL). If your blood sugar is not greater than 70 mg/dL on recheck, call [phone] for  further instructions. . Report your blood sugar to the short stay nurse when you get to Short Stay.  . If you are admitted to the hospital after surgery: o Your blood sugar will be checked by the staff and you will probably be given insulin after surgery (instead of oral diabetes medicines) to make sure you have good blood sugar levels. o The goal for blood sugar control after surgery is 80-180 mg/dL.   WHAT DO I DO ABOUT MY DIABETES MEDICATION?  Marland Kitchen Do not take oral diabetes medicines (pills) the morning of surgery.   Patient Signature:  Date:   Nurse Signature:  Date:   Reviewed and Endorsed by [facility] Patient Education Committee, August 2015           [facility] - Preparing for Surgery Before surgery, you can play an important role.  Because skin is not sterile, your skin needs to be as free of germs as possible.  You can reduce the number of germs on your skin by washing with CHG (chlorahexidine gluconate) soap before surgery.  CHG is an antiseptic cleaner which kills germs and bonds with the skin to continue killing germs even after washing. Please  DO NOT use if you have an allergy to CHG or antibacterial soaps.  If your skin becomes reddened/irritated stop using the CHG and inform your nurse when you arrive at Short Stay. Do not shave (including legs and underarms) for at least 48 hours prior to the first CHG shower.  You may shave your face/neck. Please follow these instructions carefully:  1.  Shower with CHG Soap the night before surgery and the  morning of Surgery.  2.  If you choose to wash your hair, wash your hair first as usual with your  normal  shampoo.  3.  After you shampoo, rinse your hair and body thoroughly to remove the  shampoo.                           4.  Use CHG as you would any other liquid soap.  You can apply chg directly  to the skin and wash                       Gently with a scrungie or clean washcloth.  5.  Apply the CHG Soap to your body ONLY FROM THE NECK DOWN.   Do not use on face/ open                           Wound or open sores. Avoid contact with eyes, ears mouth and genitals (private parts).                       Wash face,  Genitals (private parts) with your normal soap.             6.  Wash thoroughly, paying special attention to the area where your surgery  will be performed.  7.  Thoroughly rinse your body with warm water from the neck down.  8.  DO NOT shower/wash with your normal soap after using and rinsing off  the CHG Soap.                9.  Pat yourself dry with a clean towel.            10.  Wear clean pajamas.  11.  Place clean sheets on your bed the night of your first shower and do not  sleep with pets. Day of Surgery : Do not apply any lotions/deodorants the morning of surgery.  Please wear clean clothes to the hospital/surgery center.  FAILURE TO FOLLOW THESE INSTRUCTIONS MAY RESULT IN THE CANCELLATION OF YOUR SURGERY   ________________________________________________________________________  WHAT IS A BLOOD TRANSFUSION? Blood Transfusion Information  A transfusion is  the replacement of blood or some of its parts. Blood is made up of multiple cells which provide different functions.  Red blood cells carry oxygen and are used for blood loss replacement.  White blood cells fight against infection.  Platelets control bleeding.  Plasma helps clot blood.  Other blood products are available for specialized needs, such as hemophilia or other clotting disorders. BEFORE THE TRANSFUSION  Who gives blood for transfusions?   Healthy volunteers who are fully evaluated to make sure their blood is safe. This is blood bank blood. Transfusion therapy is the safest it has ever been in the practice of medicine. Before blood is taken from a donor, a complete history is taken to make sure that person has no history of diseases nor engages in risky social behavior (examples are intravenous drug use or sexual activity with multiple partners). The donor's travel history is screened to minimize risk of transmitting infections, such as malaria. The donated blood is tested for signs of infectious diseases, such as HIV and hepatitis. The blood is then tested to be sure it is compatible with you in order to minimize the chance of a transfusion reaction. If you or a relative donates blood, this is often done in anticipation of surgery and is not appropriate for emergency situations. It takes many days to process the donated blood. RISKS AND COMPLICATIONS Although transfusion therapy is very safe and saves many lives, the main dangers of transfusion include:   Getting an infectious disease.  Developing a transfusion reaction. This is an allergic reaction to something in the blood you were given. Every precaution is taken to prevent this. The decision to have a blood transfusion has been considered carefully by your caregiver before blood is given. Blood is not given unless the benefits outweigh the risks. AFTER THE TRANSFUSION  Right after receiving a blood transfusion, you will  usually feel much better and more energetic. This is especially true if your red blood cells have gotten low (anemic). The transfusion raises the level of the red blood cells which carry oxygen, and this usually causes an energy increase.  The nurse administering the transfusion will monitor you carefully for complications. HOME CARE INSTRUCTIONS  No special instructions are needed after a transfusion. You may find your energy is better. Speak with your caregiver about any limitations on activity for underlying diseases you may have. SEEK MEDICAL CARE IF:   Your condition is not improving after your transfusion.  You develop redness or irritation at the intravenous (IV) site. SEEK IMMEDIATE MEDICAL CARE IF:  Any of the following symptoms occur over the next 12 hours:  Shaking chills.  You have a temperature by mouth above 102 F (38.9 C), not controlled by medicine.  Chest, back, or muscle pain.  People around you feel you are not acting correctly or are confused.  Shortness of breath or difficulty breathing.  Dizziness and fainting.  You get a rash or develop hives.  You have a decrease in urine output.  Your urine turns a dark color or changes to pink,  red, or brown. Any of the following symptoms occur over the next 10 days:  You have a temperature by mouth above 102 F (38.9 C), not controlled by medicine.  Shortness of breath.  Weakness after normal activity.  The white part of the eye turns yellow (jaundice).  You have a decrease in the amount of urine or are urinating less often.  Your urine turns a dark color or changes to pink, red, or brown. Document Released: 11/30/2000 Document Revised: 02/25/2012 Document Reviewed: 07/19/2008 [facility] Patient Information 2014 [place], [place].  _______________________________________________________________________

## 2017-03-11 NOTE — Progress Notes (Signed)
LOV cardio Dr Clayborn Bigness 11-12-16 chart ECHO 12-18-16 chart LOV/medical clear Dr Rosanna Randy 02-28-17 epic EKG 09-27-16 epic

## 2017-03-13 ENCOUNTER — Encounter (HOSPITAL_COMMUNITY): Payer: Self-pay

## 2017-03-13 ENCOUNTER — Encounter (HOSPITAL_COMMUNITY)
Admission: RE | Admit: 2017-03-13 | Discharge: 2017-03-13 | Disposition: A | Discharge status: Home / Self Care | Payer: Medicare PPO | Source: Ambulatory Visit | Attending: Urology | Admitting: Urology

## 2017-03-13 DIAGNOSIS — E118 Type 2 diabetes mellitus with unspecified complications: Secondary | ICD-10-CM | POA: Insufficient documentation

## 2017-03-13 DIAGNOSIS — I10 Essential (primary) hypertension: Secondary | ICD-10-CM | POA: Insufficient documentation

## 2017-03-13 DIAGNOSIS — R413 Other amnesia: Secondary | ICD-10-CM | POA: Insufficient documentation

## 2017-03-13 DIAGNOSIS — Z01812 Encounter for preprocedural laboratory examination: Secondary | ICD-10-CM | POA: Insufficient documentation

## 2017-03-13 DIAGNOSIS — K5792 Diverticulitis of intestine, part unspecified, without perforation or abscess without bleeding: Secondary | ICD-10-CM | POA: Insufficient documentation

## 2017-03-13 DIAGNOSIS — N8110 Cystocele, unspecified: Secondary | ICD-10-CM | POA: Insufficient documentation

## 2017-03-13 LAB — BASIC METABOLIC PANEL
Anion gap: 7 (ref 5–15)
BUN: 14 mg/dL (ref 6–20)
CO2: 28 mmol/L (ref 22–32)
Calcium: 9.4 mg/dL (ref 8.9–10.3)
Chloride: 108 mmol/L (ref 101–111)
Creatinine, Ser: 0.92 mg/dL (ref 0.44–1.00)
GFR calc Af Amer: >60 mL/min (ref >60)
GFR calc non Af Amer: >60 mL/min (ref >60)
Glucose, Bld: 87 mg/dL (ref 65–99)
Potassium: 4.1 mmol/L (ref 3.5–5.1)
Sodium: 143 mmol/L (ref 135–145)

## 2017-03-13 LAB — PROTIME-INR
INR: 0.99
Prothrombin Time: 13.1 seconds (ref 11.4–15.2)

## 2017-03-13 LAB — CBC
HCT: 30.6 % — ABNORMAL LOW (ref 36.0–46.0)
Hemoglobin: 9.9 g/dL — ABNORMAL LOW (ref 12.0–15.0)
MCH: 27 pg (ref 26.0–34.0)
MCHC: 32.4 g/dL (ref 30.0–36.0)
MCV: 83.6 fL (ref 78.0–100.0)
Platelets: 395 10*3/uL (ref 150–400)
RBC: 3.66 MIL/uL — ABNORMAL LOW (ref 3.87–5.11)
RDW: 15 % (ref 11.5–15.5)
WBC: 8.7 10*3/uL (ref 4.0–10.5)

## 2017-03-13 LAB — GLUCOSE, CAPILLARY: Glucose-Capillary: 74 mg/dL (ref 65–99)

## 2017-03-13 LAB — APTT: aPTT: 30 seconds (ref 24–36)

## 2017-03-13 NOTE — H&P (Signed)
HPI: Dr Erlene Quan July 120: 67 year old female who was incidentally found to have a bladder mass on cystoscopy during a procedure (anterior-posterior repair by Dr. Kenton Kingfisher). She was taken to the operating room for TURBT on 03/2015, bilateral retrograde pyelogram. This revealed a papillary lesion just beyond the LEFT UO which was removed using cold cup biopsy forceps and the bases fulgurated. Bilateral retrograde pyelograms were negative. 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   No further follow-up was recommended by Dr. Erlene Quan  Last visit: The patient reported feeling of vaginal bulging sensation a few weeks ago. She does not reduce it. She is continent.   The patient voids every 3-4 hours and gets up twice a night to urinate. She has a poor flow for years. She can hesitate some.  Small grade 3 cystocele with central defect. Vaginal length descended from approximately 8 cm to 5 cm. She had mild vaginal shortening. She had no rectocele. She had modest hyper mobility of the bladder neck and negative cough test  The patient has mild voiding dysfunction and mild nocturia. She has recurrent prolapse symptoms. The patient had an anterior and posterior repair and cystoscopy Frebuary 25th 2016 utilizing 0 Vicryl suture.  She had a small grade 3 cystocele and mild loss of vaginal length. If she ever had surgery she would likely best benefit from a transvaginal vault suspension with cystocele repair and graft.   Today Frequency and prolapse symptoms stable. On urodynamics the patient's maximum bladder capacity was 312 mL. She did not leak with a Valsalva pressure of 79 cm of water. During voluntary voiding she voided 220 mL with a maximum flow of 17 mL/s and a maximal voiding pressure of 26 cm of water. EMG activity was normal. The details of the urodynamics were signed and dictated  I drew the patient another picture. A transvaginal vault  suspension with cystocele repair and graft versus pessary versus watchful waiting was discussed. Pros and cons and risks with sequelae were discussed in detail as outlined with my usual template. The risk of unmasking urinary incontinence was also discussed.    PMH:     Past Medical History:  Diagnosis Date  . Arthritis   . Diverticulitis   . DM (diabetes mellitus) ([place])   . HTN (hypertension)   . Memory difficulties 09/01/2015  . Short-term memory loss     Surgical History:      Past Surgical History:  Procedure Laterality Date  . ABDOMINAL HYSTERECTOMY     due to endometriosis-1 ovary left  . BREAST EXCISIONAL BIOPSY Right 1971   neg  . CARDIAC CATHETERIZATION  2000   no significant CAD per note of Dr Caryl Comes in 2013   . CHOLECYSTECTOMY    . EYE SURGERY    . FOOT SURGERY    . HAND SURGERY    . HEMICOLECTOMY    . INSERTION OF MESH N/A 08/21/2016   Procedure: INSERTION OF MESH;  Surgeon: Excell Seltzer, MD;  Location: [facility];  Service: General;  Laterality: N/A;  . LAPAROSCOPIC LYSIS OF ADHESIONS N/A 08/21/2016   Procedure: LAPAROSCOPIC LYSIS OF ADHESIONS;  Surgeon: Excell Seltzer, MD;  Location: [facility];  Service: General;  Laterality: N/A;  . LUMBAR [place]    . ROUX-EN-Y GASTRIC BYPASS    . TONSILLECTOMY    . UPPER GI ENDOSCOPY  01/12/16   normal larynx, normal esophagus. gastric bypass with a normal-sized pouch and intact staple line, normal examined jejunum, otherwise exam was normal  .  VENTRAL HERNIA REPAIR N/A 08/21/2016   Procedure: LAPAROSCOPIC VENTRAL HERNIA;  Surgeon: Excell Seltzer, MD;  Location: [facility];  Service: General;  Laterality: N/A;    Home Medications:      Allergies as of 01/09/2017      Reactions   Lotrel [amlodipine Besy-benazepril Hcl] Anaphylaxis   Contrast Media [iodinated Diagnostic Agents] Swelling, Other (See Comments)   Reaction:  Neck swelling    Niacin Hives   Sulfa Antibiotics Hives                Medication List           Accurate as of 01/09/17 11:04 AM. Always use your most recent med list.           cyclobenzaprine 5 MG tablet Commonly known as:  FLEXERIL Take 1 tablet (5 mg total) by mouth 3 (three) times daily as needed for muscle spasms.   diazepam 5 MG tablet Commonly known as:  VALIUM Take 1 tablet (5 mg total) by mouth as needed for anxiety. as needed for PASSNEGER   gabapentin 100 MG capsule Commonly known as:  NEURONTIN Take 1 capsule (100 mg total) by mouth 3 (three) times daily.   hydrochlorothiazide 25 MG tablet Commonly known as:  HYDRODIURIL TAKE 1 TABLET (25 MG TOTAL) BY MOUTH DAILY.   HYDROcodone-acetaminophen 5-325 MG tablet Commonly known as:  NORCO/VICODIN Take 1 tablet by mouth 3 (three) times daily.   Melatonin 10 MG Tabs Take 10 mg by mouth at bedtime.   metFORMIN 500 MG tablet Commonly known as:  GLUCOPHAGE Take 1 tablet (500 mg total) by mouth 2 (two) times daily with a meal.   metoprolol succinate 25 MG 24 hr tablet Commonly known as:  TOPROL-XL Take 25 mg by mouth daily.   omeprazole 20 MG capsule Commonly known as:  PRILOSEC Take 1 capsule (20 mg total) by mouth daily.       Allergies:       Allergies  Allergen Reactions  . Lotrel [Amlodipine Besy-Benazepril Hcl] Anaphylaxis  . Contrast Media [Iodinated Diagnostic Agents] Swelling and Other (See Comments)    Reaction:  Neck swelling   . Niacin Hives  . Sulfa Antibiotics Hives    Family History:       Family History  Problem Relation Age of Onset  . Cancer Father 42    Lung  . Coronary artery disease Father 75  . COPD Mother   . Diabetes Mother   . Hypertension Mother   . Cancer Paternal Grandmother     Breast  . Breast cancer Paternal Grandmother   . Congestive Heart Failure Maternal Grandmother   . Emphysema Maternal Grandfather   . Heart attack Paternal Grandfather   . Cancer Paternal Aunt      Breast  . Breast cancer Paternal Aunt 42  . Breast cancer Paternal Aunt 23    Social History:  reports that she has never smoked. She has never used smokeless tobacco. She reports that she does not drink alcohol or use drugs.  ROS: UROLOGY Frequent Urination?: Yes Hard to postpone urination?: Yes Burning/pain with urination?: No Get up at night to urinate?: No Leakage of urine?: Yes Urine stream starts and stops?: No Trouble starting stream?: Yes Do you have to strain to urinate?: No Blood in urine?: No Urinary tract infection?: No Sexually transmitted disease?: No Injury to kidneys or bladder?: No Painful intercourse?: No Weak stream?: Yes Currently pregnant?: No Vaginal bleeding?: No Last menstrual period?: n  Gastrointestinal  Nausea?: No Vomiting?: No Indigestion/heartburn?: No Diarrhea?: No Constipation?: No  Constitutional Fever: No Night sweats?: No Weight loss?: No Fatigue?: No  Skin Skin rash/lesions?: No Itching?: No  Eyes Blurred vision?: No Double vision?: No  Ears/Nose/Throat Sore throat?: No Sinus problems?: No  Hematologic/Lymphatic Swollen glands?: No Easy bruising?: No  Cardiovascular Leg swelling?: No Chest pain?: No  Respiratory Cough?: No Shortness of breath?: No  Endocrine Excessive thirst?: No  Musculoskeletal Back pain?: Yes Joint pain?: Yes  Neurological Headaches?: No Dizziness?: No  Psychologic Depression?: No Anxiety?: No  Physical Exam: BP 112/74   Pulse 76   Ht 5\' 3"  (1.6 m)   Wt 134 lb 1.6 oz (60.8 kg)   BMI 23.75 kg/m   .  Laboratory Data: Recent Labs       Lab Results  Component Value Date   WBC 8.8 09/27/2016   HGB 10.7 (L) 09/27/2016   HCT 31.5 (L) 09/27/2016   MCV 80.9 09/27/2016   PLT 290 09/27/2016      Recent Labs       Lab Results  Component Value Date   CREATININE 1.20 (H) 09/27/2016      Recent Labs  No results found for: PSA    Recent  Labs  No results found for: TESTOSTERONE    Recent Labs       Lab Results  Component Value Date   HGBA1C 5.5 08/14/2016      Urinalysis Labs (Brief)          Component Value Date/Time   COLORURINE AMBER (A) 04/24/2016 0647   APPEARANCEUR HAZY (A) 04/24/2016 0647   APPEARANCEUR Clear 06/28/2015 1055   LABSPEC 1.017 04/24/2016 0647   LABSPEC 1.016 02/17/2014 2031   PHURINE 5.0 04/24/2016 [place] 04/24/2016 0647   GLUCOSEU Negative 02/17/2014 2031   HGBUR 1+ (A) 04/24/2016 0647   BILIRUBINUR NEGATIVE 04/24/2016 0647   BILIRUBINUR Negative 06/28/2015 1055   BILIRUBINUR Negative 02/17/2014 2031   KETONESUR TRACE (A) 04/24/2016 0647   PROTEINUR NEGATIVE 04/24/2016 0647   UROBILINOGEN 0.2 11/15/2012 1507   NITRITE POSITIVE (A) 04/24/2016 0647   LEUKOCYTESUR 1+ (A) 04/24/2016 0647   LEUKOCYTESUR Negative 06/28/2015 1055   LEUKOCYTESUR Negative 02/17/2014 2031      Pertinent Imaging: None   Assessment & Plan:   The patient is not interested and watchful waiting or a pessary. She is sexually active. In addition during our discussions we talked about relative vaginal shortening and sequelae I symptoms this will be her third vaginal operation. Vaginal pain also discussed. Mesh issues discussed. We were giggling a little bit after our discussion since she was not aware of these relative risks when she consented last time apparently. We both agreed that I would perform cystoscopy during the procedure and I had noted Dr. Cherrie Gauze previous recommendations on follow-up  Risks discussed with diagram  I drew her a picture and we talked about prolapse surgery in detail. Pros, cons, general surgical and anesthetic risks, and other options including behavioral therapy, pessaries, and watchful waiting were discussed. She understands that prolapse repairs are successful in 80-85% of cases for prolapse symptoms and can recur anteriorly, posteriorly,  and/or apically. She understands that in most cases I use a graft and general risks were discussed. Surgical risks were described but not limited to the discussion of injury to neighboring structures including the bowel (with possible life-threatening sepsis and colostomy), bladder, urethra, vagina (all resulting in further surgery), and ureter (resulting in re-implantation). We  talked about injury to nerves/soft tissue leading to debilitating and intractable pelvic, abdominal, and lower extremity pain syndromes and neuropathies. The risks of buttock pain, intractable dyspareunia, and vaginal narrowing and shortening with sequelae were discussed. Bleeding risks, transfusion rates, and infection were discussed. The risk of persistent, de novo, or worsening bladder and/or bowel incontinence/dysfunction was discussed. The need for CIC was described as well the usual post-operative course. The patient understands that she might not reach her treatment goal and that she might be worse following surgery.   After a thorough review of the management options for the patient's condition the patient  elected to proceed with surgical therapy as noted above. We have discussed the potential benefits and risks of the procedure, side effects of the proposed treatment, the likelihood of the patient achieving the goals of the procedure, and any potential problems that might occur during the procedure or recuperation. Informed consent has been obtained.

## 2017-03-13 NOTE — Progress Notes (Signed)
CBC results routed via epic to Dr Mikle Bosworth

## 2017-03-14 LAB — HEMOGLOBIN A1C
Hgb A1c MFr Bld: 5.8 % — ABNORMAL HIGH (ref 4.8–5.6)
Mean Plasma Glucose: 120 mg/dL

## 2017-03-14 LAB — ABO/RH: ABO/RH(D): O POS

## 2017-03-18 MED ORDER — GENTAMICIN SULFATE 40 MG/ML IJ SOLN
5.0000 mg/kg | INTRAVENOUS | Status: AC
  Administered 2017-03-19: 300 mg via INTRAVENOUS
  Filled 2017-03-18: qty 7.5

## 2017-03-19 ENCOUNTER — Observation Stay (HOSPITAL_COMMUNITY)
Admission: RE | Admit: 2017-03-19 | Discharge: 2017-03-20 | Disposition: A | Discharge status: Home / Self Care | Payer: Medicare PPO | Source: Ambulatory Visit | Attending: Urology | Admitting: Urology

## 2017-03-19 ENCOUNTER — Encounter (HOSPITAL_COMMUNITY): Payer: Self-pay

## 2017-03-19 ENCOUNTER — Ambulatory Visit (HOSPITAL_COMMUNITY): Payer: Medicare PPO | Admitting: Certified Registered Nurse Anesthetist

## 2017-03-19 ENCOUNTER — Encounter (HOSPITAL_COMMUNITY)
Admission: RE | Disposition: A | Discharge status: Home / Self Care | Payer: Self-pay | Source: Ambulatory Visit | Attending: Urology

## 2017-03-19 DIAGNOSIS — Z79899 Other long term (current) drug therapy: Secondary | ICD-10-CM | POA: Insufficient documentation

## 2017-03-19 DIAGNOSIS — E119 Type 2 diabetes mellitus without complications: Secondary | ICD-10-CM | POA: Diagnosis not present

## 2017-03-19 DIAGNOSIS — Z7984 Long term (current) use of oral hypoglycemic drugs: Secondary | ICD-10-CM | POA: Diagnosis not present

## 2017-03-19 DIAGNOSIS — N8110 Cystocele, unspecified: Secondary | ICD-10-CM | POA: Diagnosis not present

## 2017-03-19 DIAGNOSIS — Z9884 Bariatric surgery status: Secondary | ICD-10-CM | POA: Insufficient documentation

## 2017-03-19 DIAGNOSIS — I10 Essential (primary) hypertension: Secondary | ICD-10-CM | POA: Insufficient documentation

## 2017-03-19 DIAGNOSIS — N819 Female genital prolapse, unspecified: Secondary | ICD-10-CM | POA: Diagnosis present

## 2017-03-19 HISTORY — PX: CYSTOCELE REPAIR: SHX163

## 2017-03-19 HISTORY — PX: CYSTOSCOPY: SHX5120

## 2017-03-19 LAB — GLUCOSE, CAPILLARY
Glucose-Capillary: 96 mg/dL (ref 65–99)
Glucose-Capillary: 102 mg/dL — ABNORMAL HIGH (ref 65–99)
Glucose-Capillary: 319 mg/dL — ABNORMAL HIGH (ref 65–99)
Glucose-Capillary: 137 mg/dL — ABNORMAL HIGH (ref 65–99)

## 2017-03-19 LAB — TYPE AND SCREEN
ABO/RH(D): O POS
Antibody Screen: NEGATIVE

## 2017-03-19 SURGERY — COLPORRHAPHY, ANTERIOR, FOR CYSTOCELE REPAIR
Anesthesia: General

## 2017-03-19 MED ORDER — HYDROMORPHONE HCL 1 MG/ML IJ SOLN
0.5000 mg | INTRAMUSCULAR | Status: DC | PRN
  Administered 2017-03-19 – 2017-03-20 (×4): 1 mg via INTRAVENOUS
  Filled 2017-03-19 (×4): qty 1

## 2017-03-19 MED ORDER — DEXAMETHASONE SODIUM PHOSPHATE 10 MG/ML IJ SOLN
INTRAMUSCULAR | Status: DC | PRN
  Administered 2017-03-19: 10 mg via INTRAVENOUS

## 2017-03-19 MED ORDER — DIPHENHYDRAMINE HCL 50 MG/ML IJ SOLN: 12.5000 mg | Freq: Four times a day (QID) | INTRAMUSCULAR | Status: DC | PRN

## 2017-03-19 MED ORDER — SODIUM CHLORIDE 0.9 % IR SOLN
Status: AC
  Filled 2017-03-19 (×2): qty 500000

## 2017-03-19 MED ORDER — PROPOFOL 10 MG/ML IV BOLUS
INTRAVENOUS | Status: AC
  Filled 2017-03-19: qty 20

## 2017-03-19 MED ORDER — ROCURONIUM BROMIDE 50 MG/5ML IV SOSY
PREFILLED_SYRINGE | INTRAVENOUS | Status: DC | PRN
  Administered 2017-03-19: 10 mg via INTRAVENOUS
  Administered 2017-03-19: 40 mg via INTRAVENOUS

## 2017-03-19 MED ORDER — ONDANSETRON HCL 4 MG/2ML IJ SOLN
INTRAMUSCULAR | Status: DC | PRN
  Administered 2017-03-19: 4 mg via INTRAVENOUS

## 2017-03-19 MED ORDER — SCOPOLAMINE 1 MG/3DAYS TD PT72
1.0000 | MEDICATED_PATCH | TRANSDERMAL | Status: DC
  Administered 2017-03-19: 1 via TRANSDERMAL

## 2017-03-19 MED ORDER — DEXAMETHASONE SODIUM PHOSPHATE 10 MG/ML IJ SOLN
INTRAMUSCULAR | Status: AC
  Filled 2017-03-19: qty 1

## 2017-03-19 MED ORDER — LACTATED RINGERS IV SOLN
INTRAVENOUS | Status: DC
  Administered 2017-03-19 (×2): via INTRAVENOUS

## 2017-03-19 MED ORDER — OXYBUTYNIN CHLORIDE 5 MG PO TABS: 5.0000 mg | ORAL_TABLET | Freq: Three times a day (TID) | ORAL | Status: DC | PRN

## 2017-03-19 MED ORDER — ONDANSETRON HCL 4 MG/2ML IJ SOLN
INTRAMUSCULAR | Status: AC
  Filled 2017-03-19: qty 2

## 2017-03-19 MED ORDER — HYDROMORPHONE HCL 1 MG/ML IJ SOLN
0.2500 mg | INTRAMUSCULAR | Status: DC | PRN
  Administered 2017-03-19: 0.5 mg via INTRAVENOUS
  Administered 2017-03-19 (×2): 0.25 mg via INTRAVENOUS

## 2017-03-19 MED ORDER — ACETAMINOPHEN 500 MG PO TABS
1000.0000 mg | ORAL_TABLET | Freq: Four times a day (QID) | ORAL | Status: AC
  Administered 2017-03-19 – 2017-03-20 (×4): 1000 mg via ORAL
  Filled 2017-03-19 (×4): qty 2

## 2017-03-19 MED ORDER — FENTANYL CITRATE (PF) 250 MCG/5ML IJ SOLN
INTRAMUSCULAR | Status: AC
  Filled 2017-03-19: qty 5

## 2017-03-19 MED ORDER — LIDOCAINE 2% (20 MG/ML) 5 ML SYRINGE
INTRAMUSCULAR | Status: AC
  Filled 2017-03-19: qty 5

## 2017-03-19 MED ORDER — SCOPOLAMINE 1 MG/3DAYS TD PT72
MEDICATED_PATCH | TRANSDERMAL | Status: AC
  Filled 2017-03-19: qty 1

## 2017-03-19 MED ORDER — SUCCINYLCHOLINE CHLORIDE 200 MG/10ML IV SOSY
PREFILLED_SYRINGE | INTRAVENOUS | Status: DC | PRN
  Administered 2017-03-19: 100 mg via INTRAVENOUS

## 2017-03-19 MED ORDER — HYDROCHLOROTHIAZIDE 25 MG PO TABS
25.0000 mg | ORAL_TABLET | Freq: Every day | ORAL | Status: DC
  Administered 2017-03-20: 25 mg via ORAL
  Filled 2017-03-19: qty 1

## 2017-03-19 MED ORDER — STERILE WATER FOR IRRIGATION IR SOLN
Status: DC | PRN
  Administered 2017-03-19: 900 mL via INTRAVESICAL

## 2017-03-19 MED ORDER — POTASSIUM CHLORIDE IN NACL 20-0.45 MEQ/L-% IV SOLN
INTRAVENOUS | Status: DC
  Administered 2017-03-19 – 2017-03-20 (×2): via INTRAVENOUS
  Filled 2017-03-19 (×2): qty 1000

## 2017-03-19 MED ORDER — LIDOCAINE-EPINEPHRINE (PF) 1 %-1:200000 IJ SOLN
INTRAMUSCULAR | Status: AC
Start: 2017-03-19 — End: 2017-03-19
  Filled 2017-03-19: qty 60

## 2017-03-19 MED ORDER — MIDAZOLAM HCL 2 MG/2ML IJ SOLN
INTRAMUSCULAR | Status: AC
  Filled 2017-03-19: qty 2

## 2017-03-19 MED ORDER — PROMETHAZINE HCL 25 MG/ML IJ SOLN
6.2500 mg | INTRAMUSCULAR | Status: DC | PRN
Start: 2017-03-19 — End: 2017-03-19

## 2017-03-19 MED ORDER — PHENAZOPYRIDINE HCL 200 MG PO TABS
200.0000 mg | ORAL_TABLET | ORAL | Status: AC
  Administered 2017-03-19: 200 mg via ORAL
  Filled 2017-03-19: qty 1

## 2017-03-19 MED ORDER — DIPHENHYDRAMINE HCL 12.5 MG/5ML PO ELIX
12.5000 mg | ORAL_SOLUTION | Freq: Four times a day (QID) | ORAL | Status: DC | PRN
Start: 2017-03-19 — End: 2017-03-20

## 2017-03-19 MED ORDER — SUGAMMADEX SODIUM 200 MG/2ML IV SOLN
INTRAVENOUS | Status: AC
  Filled 2017-03-19: qty 2

## 2017-03-19 MED ORDER — SODIUM CHLORIDE 0.9 % IR SOLN
Status: DC | PRN
  Administered 2017-03-19: 250 mL

## 2017-03-19 MED ORDER — INSULIN ASPART 100 UNIT/ML ~~LOC~~ SOLN
0.0000 [IU] | Freq: Three times a day (TID) | SUBCUTANEOUS | Status: DC
  Administered 2017-03-19: 11 [IU] via SUBCUTANEOUS

## 2017-03-19 MED ORDER — ROCURONIUM BROMIDE 50 MG/5ML IV SOSY
PREFILLED_SYRINGE | INTRAVENOUS | Status: AC
  Filled 2017-03-19: qty 5

## 2017-03-19 MED ORDER — GABAPENTIN 100 MG PO CAPS
100.0000 mg | ORAL_CAPSULE | Freq: Three times a day (TID) | ORAL | Status: DC
  Administered 2017-03-19 – 2017-03-20 (×3): 100 mg via ORAL
  Filled 2017-03-19 (×4): qty 1

## 2017-03-19 MED ORDER — ESTRADIOL 0.1 MG/GM VA CREA
TOPICAL_CREAM | VAGINAL | Status: DC | PRN
  Administered 2017-03-19: 1 via VAGINAL

## 2017-03-19 MED ORDER — FENTANYL CITRATE (PF) 100 MCG/2ML IJ SOLN
INTRAMUSCULAR | Status: DC | PRN
  Administered 2017-03-19: 50 ug via INTRAVENOUS
  Administered 2017-03-19 (×3): 25 ug via INTRAVENOUS
  Administered 2017-03-19: 50 ug via INTRAVENOUS
  Administered 2017-03-19: 25 ug via INTRAVENOUS
  Administered 2017-03-19: 50 ug via INTRAVENOUS

## 2017-03-19 MED ORDER — DOCUSATE SODIUM 100 MG PO CAPS
100.0000 mg | ORAL_CAPSULE | Freq: Two times a day (BID) | ORAL | Status: DC
  Administered 2017-03-19 – 2017-03-20 (×2): 100 mg via ORAL
  Filled 2017-03-19 (×3): qty 1

## 2017-03-19 MED ORDER — SUGAMMADEX SODIUM 200 MG/2ML IV SOLN
INTRAVENOUS | Status: DC | PRN
  Administered 2017-03-19: 150 mg via INTRAVENOUS

## 2017-03-19 MED ORDER — PROPOFOL 10 MG/ML IV BOLUS
INTRAVENOUS | Status: DC | PRN
  Administered 2017-03-19: 20 mg via INTRAVENOUS

## 2017-03-19 MED ORDER — OXYCODONE HCL 5 MG PO TABS
5.0000 mg | ORAL_TABLET | ORAL | Status: DC | PRN
  Administered 2017-03-20 (×2): 5 mg via ORAL
  Filled 2017-03-19 (×2): qty 1

## 2017-03-19 MED ORDER — HYDROCODONE-ACETAMINOPHEN 5-325 MG PO TABS: 1.0000 | ORAL_TABLET | Freq: Four times a day (QID) | ORAL | 0 refills | Status: DC | PRN

## 2017-03-19 MED ORDER — SENNA 8.6 MG PO TABS
1.0000 | ORAL_TABLET | Freq: Two times a day (BID) | ORAL | Status: DC
  Administered 2017-03-19 – 2017-03-20 (×2): 8.6 mg via ORAL
  Filled 2017-03-19 (×3): qty 1

## 2017-03-19 MED ORDER — LIDOCAINE 2% (20 MG/ML) 5 ML SYRINGE
INTRAMUSCULAR | Status: DC | PRN
Start: 2017-03-19 — End: 2017-03-19
  Administered 2017-03-19: 80 mg via INTRAVENOUS

## 2017-03-19 MED ORDER — LIDOCAINE-EPINEPHRINE (PF) 1 %-1:200000 IJ SOLN
INTRAMUSCULAR | Status: DC | PRN
  Administered 2017-03-19: 29 mL

## 2017-03-19 MED ORDER — SODIUM CHLORIDE 0.9 % IV SOLN
1.5000 g | Freq: Four times a day (QID) | INTRAVENOUS | Status: AC
  Administered 2017-03-19 – 2017-03-20 (×3): 1.5 g via INTRAVENOUS
  Filled 2017-03-19 (×3): qty 1.5

## 2017-03-19 MED ORDER — ONDANSETRON HCL 4 MG/2ML IJ SOLN
4.0000 mg | INTRAMUSCULAR | Status: DC | PRN
  Administered 2017-03-20: 4 mg via INTRAVENOUS
  Filled 2017-03-19: qty 2

## 2017-03-19 MED ORDER — SUCCINYLCHOLINE CHLORIDE 200 MG/10ML IV SOSY
PREFILLED_SYRINGE | INTRAVENOUS | Status: AC
  Filled 2017-03-19: qty 10

## 2017-03-19 MED ORDER — SODIUM CHLORIDE 0.9 % IV SOLN
1.5000 g | INTRAVENOUS | Status: AC
  Administered 2017-03-19: 1.5 g via INTRAVENOUS
  Filled 2017-03-19: qty 1.5

## 2017-03-19 MED ORDER — MELATONIN 10 MG PO TABS: 10.0000 mg | ORAL_TABLET | Freq: Every evening | ORAL | Status: DC | PRN

## 2017-03-19 MED ORDER — MIDAZOLAM HCL 5 MG/5ML IJ SOLN
INTRAMUSCULAR | Status: DC | PRN
  Administered 2017-03-19 (×2): 1 mg via INTRAVENOUS

## 2017-03-19 MED ORDER — HYDROMORPHONE HCL 1 MG/ML IJ SOLN
INTRAMUSCULAR | Status: AC
  Administered 2017-03-19: 0.25 mg via INTRAVENOUS
  Filled 2017-03-19: qty 1

## 2017-03-19 MED ORDER — ESTRADIOL 0.1 MG/GM VA CREA
TOPICAL_CREAM | VAGINAL | Status: AC
  Filled 2017-03-19: qty 42.5

## 2017-03-19 MED ORDER — METOPROLOL SUCCINATE ER 25 MG PO TB24
25.0000 mg | ORAL_TABLET | Freq: Every day | ORAL | Status: DC
  Administered 2017-03-20: 25 mg via ORAL
  Filled 2017-03-19: qty 1

## 2017-03-19 SURGICAL SUPPLY — 43 items
BAG URINE DRAINAGE (UROLOGICAL SUPPLIES) ×2 IMPLANT
BAG URO CATCHER STRL LF (MISCELLANEOUS) IMPLANT
BLADE SURG 15 STRL LF DISP TIS (BLADE) ×1 IMPLANT
BLADE SURG 15 STRL SS (BLADE) ×2
CATH FOLEY 2WAY SLVR  5CC 14FR (CATHETERS) ×1
CATH FOLEY 2WAY SLVR 5CC 14FR (CATHETERS) ×1 IMPLANT
CLOTH BEACON ORANGE TIMEOUT ST (SAFETY) ×2 IMPLANT
COVER MAYO STAND STRL (DRAPES) ×2 IMPLANT
COVER SURGICAL LIGHT HANDLE (MISCELLANEOUS) ×2 IMPLANT
DEVICE CAPIO SLIM SINGLE (INSTRUMENTS) IMPLANT
DRAIN PENROSE 18X1/4 LTX STRL (WOUND CARE) ×2 IMPLANT
DRAPE SHEET LG 3/4 BI-LAMINATE (DRAPES) ×2 IMPLANT
ELECT PENCIL ROCKER SW 15FT (MISCELLANEOUS) ×2 IMPLANT
GAUZE PACKING 2X5 YD STRL (GAUZE/BANDAGES/DRESSINGS) ×2 IMPLANT
GAUZE SPONGE 4X4 16PLY XRAY LF (GAUZE/BANDAGES/DRESSINGS) ×8 IMPLANT
GLOVE BIO SURGEON STRL SZ 6.5 (GLOVE) ×2 IMPLANT
GLOVE BIOGEL M STRL SZ7.5 (GLOVE) ×6 IMPLANT
GLOVE ECLIPSE 8.5 STRL (GLOVE) ×2 IMPLANT
GOWN STRL REUS W/TWL XL LVL3 (GOWN DISPOSABLE) ×6 IMPLANT
HOLDER FOLEY CATH W/STRAP (MISCELLANEOUS) ×2 IMPLANT
KIT BASIN OR (CUSTOM PROCEDURE TRAY) ×2 IMPLANT
NEEDLE HYPO 22GX1.5 SAFETY (NEEDLE) ×2 IMPLANT
NEEDLE MAYO 6 CRC TAPER PT (NEEDLE) IMPLANT
PACK CYSTO (CUSTOM PROCEDURE TRAY) ×2 IMPLANT
PLUG CATH AND CAP STER (CATHETERS) ×2 IMPLANT
RETRACTOR STAY HOOK 5MM (MISCELLANEOUS) ×2 IMPLANT
SHEET LAVH (DRAPES) ×2 IMPLANT
SUT CAPIO ETHIBPND (SUTURE) IMPLANT
SUT VIC AB 0 CT1 27 (SUTURE) ×2
SUT VIC AB 0 CT1 27XBRD ANTBC (SUTURE) ×1 IMPLANT
SUT VIC AB 2-0 CT1 27 (SUTURE) ×6
SUT VIC AB 2-0 CT1 27XBRD (SUTURE) ×3 IMPLANT
SUT VIC AB 2-0 SH 27 (SUTURE) ×4
SUT VIC AB 2-0 SH 27X BRD (SUTURE) ×2 IMPLANT
SUT VIC AB 3-0 SH 27 (SUTURE) ×6
SUT VIC AB 3-0 SH 27XBRD (SUTURE) ×3 IMPLANT
SUT VICRYL 0 UR6 27IN ABS (SUTURE) ×4 IMPLANT
SYR 10ML LL (SYRINGE) ×2 IMPLANT
TOWEL OR 17X26 10 PK STRL BLUE (TOWEL DISPOSABLE) ×2 IMPLANT
TOWEL OR NON WOVEN STRL DISP B (DISPOSABLE) ×2 IMPLANT
TUBING CONNECTING 10 (TUBING) ×2 IMPLANT
WATER STERILE IRR 500ML POUR (IV SOLUTION) ×2 IMPLANT
YANKAUER SUCT BULB TIP 10FT TU (MISCELLANEOUS) ×2 IMPLANT

## 2017-03-19 NOTE — Interval H&P Note (Signed)
History and Physical Interval Note:  03/19/2017 9:46 AM  Julie Acevedo  has presented today for surgery, with the diagnosis of CYSTOCELE AND VAULT PROLAPSE  The various methods of treatment have been discussed with the patient and family. After consideration of risks, benefits and other options for treatment, the patient has consented to  Procedure(s): ANTERIOR REPAIR (CYSTOCELE) (N/A) VAULT PROLAPSE AND GRAFT (N/A) CYSTOSCOPY (N/A) as a surgical intervention .  The patient's history has been reviewed, patient examined, no change in status, stable for surgery.  I have reviewed the patient's chart and labs.  Questions were answered to the patient's satisfaction.     Nathaniel Wakeley A

## 2017-03-19 NOTE — Anesthesia Postprocedure Evaluation (Addendum)
Anesthesia Post Note  Patient: Julie Acevedo  Procedure(s) Performed: Procedure(s) (LRB): ANTERIOR REPAIR (CYSTOCELE) (N/A) CYSTOSCOPY (N/A)  Patient location during evaluation: PACU Anesthesia Type: General Level of consciousness: sedated Pain management: pain level controlled Vital Signs Assessment: post-procedure vital signs reviewed and stable Respiratory status: spontaneous breathing and respiratory function stable Cardiovascular status: stable Anesthetic complications: no       Last Vitals:  Vitals:   03/19/17 1344 03/19/17 1357  BP: 128/65 125/71  Pulse: 62 (!) 56  Resp: 15 15  Temp: 36.3 C 36.7 C    Last Pain:  Vitals:   03/19/17 1357  TempSrc:   PainSc: 7                  Waylon Koffler DANIEL

## 2017-03-19 NOTE — Discharge Summary (Signed)
Physician Discharge Summary  Patient ID: Julie Acevedo MRN: [id number] DOB/AGE: Mar 03, 1951 66 y.o.  Admit date: 03/19/2017 Discharge date: 03/27/2017  Admission Diagnoses: Vaginal vault prolapse, cystocele  Discharge Diagnoses: Cystocele  Discharged Condition: good  Hospital Course:  Julie Acevedo is a 66 y.o. female who is s/p cystocele repair with cystoscopy on 03/19/17 with Dr. Matilde Sprang.   The patient tolerated the procedure well, was extubated in the OR and taken to the recovery unit for routine postoperative care. She was then transferred to the floor. By POD1 she had met the usual goals for discharge including ambulating at a preoperative capacity, having pain controlled with PO PRN medications, and tolerating a regular diet. Vaginal packing was removed on POD1. Foley catheter was removed on POD1 and she passed a trial of void prior to discharge.   The patient will follow up with [facility] in near future with Dr. Matilde Sprang. They will be discharged with prescriptions for vicodin.    Consults: None  Significant Diagnostic Studies: labs: Cr 0.9, Hb stable at 9.7 (preop 9.9)  Treatments: surgery: as noted above  Discharge Exam: Blood pressure 112/60, pulse 73, temperature 97.9 F (36.6 C), temperature source Oral, resp. rate 16, height '5\' 3"'$  (1.6 m), weight 60.9 kg (134 lb 4 oz), SpO2 100 %.  General:  well-developed and well-nourished female in NAD, lying in bed, alert & oriented, pleasant HEENT: Julie Acevedo/AT, EOMI, sclera anicteric, hearing grossly intact, no nasal discharge, MMM Respiratory: nonlabored respirations, satting well on RA, symmetrical chest rise Cardiovascular: pulse regular rate & rhythm Abdominal: soft, NTTP, nondistended GU: voiding spontaneously, VP removed Extremities: warm, well-perfused, no c/c/e Neuro: no focal deficits   Disposition: 01-Home or Self Care   Allergies as of 03/20/2017      Reactions   Lotrel [amlodipine Besy-benazepril Hcl] Anaphylaxis    Contrast Media [iodinated Diagnostic Agents] Swelling, Other (See Comments)   Reaction:  Neck swelling    Niacin Hives   Sulfa Antibiotics Hives      Medication List    TAKE these medications   gabapentin 100 MG capsule Commonly known as:  NEURONTIN Take 1 capsule (100 mg total) by mouth 3 (three) times daily.   hydrochlorothiazide 25 MG tablet Commonly known as:  HYDRODIURIL TAKE 1 TABLET (25 MG TOTAL) BY MOUTH DAILY.   HYDROcodone-acetaminophen 5-325 MG tablet Commonly known as:  NORCO/VICODIN Take 1-2 tablets by mouth every 6 (six) hours as needed for moderate pain or severe pain. What changed:  how much to take  when to take this  reasons to take this   Melatonin 10 MG Tabs Take 10 mg by mouth at bedtime as needed (for sleep.).   metFORMIN 500 MG tablet Commonly known as:  GLUCOPHAGE Take 1 tablet (500 mg total) by mouth 2 (two) times daily with a meal.   metoprolol succinate 25 MG 24 hr tablet Commonly known as:  TOPROL-XL Take 25 mg by mouth daily.   zolpidem 10 MG tablet Commonly known as:  AMBIEN Take 1 tablet (10 mg total) by mouth at bedtime as needed for sleep.      Follow-up Information    MACDIARMID,SCOTT A, MD Follow up.   Specialty:  Urology Why:  office will call you with date and time of appointment Contact information: [place] [address] [phone]           Signed: Burnice Logan 03/27/2017, 12:46 PM

## 2017-03-19 NOTE — Anesthesia Preprocedure Evaluation (Signed)
Anesthesia Evaluation    Reviewed: Allergy & Precautions, NPO status , Patient's Chart, lab work & pertinent test results, reviewed documented beta blocker date and time   Airway Mallampati: II  TM Distance: >3 FB Neck ROM: Full    Dental no notable dental hx.    Pulmonary neg pulmonary ROS,    Pulmonary exam normal breath sounds clear to auscultation       Cardiovascular hypertension, Pt. on medications and Pt. on home beta blockers negative cardio ROS Normal cardiovascular exam Rhythm:Regular Rate:Normal     Neuro/Psych negative neurological ROS  negative psych ROS   GI/Hepatic negative GI ROS, Neg liver ROS,   Endo/Other  diabetes, Type 2, Oral Hypoglycemic Agents  Renal/GU      Musculoskeletal  (+) Arthritis ,   Abdominal   Peds  Hematology negative hematology ROS (+)   Anesthesia Other Findings   Reproductive/Obstetrics negative OB ROS                             Anesthesia Physical  Anesthesia Plan  ASA: III  Anesthesia Plan: General   Post-op Pain Management:    Induction: Intravenous  Airway Management Planned: Oral ETT  Additional Equipment:   Intra-op Plan:   Post-operative Plan: Extubation in OR  Informed Consent: I have reviewed the patients History and Physical, chart, labs and discussed the procedure including the risks, benefits and alternatives for the proposed anesthesia with the patient or authorized representative who has indicated his/her understanding and acceptance.   Dental advisory given  Plan Discussed with: CRNA  Anesthesia Plan Comments:         Anesthesia Quick Evaluation

## 2017-03-19 NOTE — Progress Notes (Signed)
PHARMACIST - PHYSICIAN ORDER COMMUNICATION  CONCERNING: P&T Medication Policy on Herbal Medications  DESCRIPTION:  This patient's order for: Melatonin has been noted.  This product(s) is classified as an "herbal" or natural product. Due to a lack of definitive safety studies or FDA approval, nonstandard manufacturing practices, plus the potential risk of unknown drug-drug interactions while on inpatient medications, the Pharmacy and Therapeutics Committee does not permit the use of "herbal" or natural products of this type within [facility].   ACTION TAKEN: The pharmacy department is unable to verify this order at this time and your patient has been informed of this safety policy. Please reevaluate patient's clinical condition at discharge and address if the herbal or natural product(s) should be resumed at that time.    Lindell Spar, PharmD, BCPS Pager: [phone] 03/19/2017 2:10 PM

## 2017-03-19 NOTE — Op Note (Signed)
Preoperative diagnosis: Cystocele and mild vault prolapse Postoperative diagnosis: Cystocele and mild vault prolapse Surgery: Cystocele repair and cystoscopy Surgeon: Dr. Nicki Reaper Mirna Sutcliffe Asst.: Estill Bamberg dancy  The patient has the above diagnoses and consented the above procedure. Extra care was taken with leg positioning to minimize the risk compartment syndrome and neuropathy and deep vein thrombosis.  The assistant was present and necessary for all steps of the operation described. The assistant played a critical role assisting during the operation  Under anesthesia the patient's cuff was actually quite well supported and only descended approximately 2 cm. In my opinion she did not need a vault suspension but I wanted to reevaluate after the cystocele repair.  A 3-0 Vicryl was placed at the vaginal apex. She had a large grade 2 or small grade 3 cystocele. She had a moderate central defect. 25 mL of a lidocaine epinephrine mixture was utilized. Between my Allis clamps I made a T-shaped anterior vaginal wall incision close to the apex and extended towards the bladder neck. I sharply mobilized the thin vaginal wall mucosa from the underlying pubocervical fascia to the white line bilaterally. I mobilized well at the apex. She had a lot of redundancy of tissue with a little bit of tissue elasticity at the apex but I was very pleased with my mobilization.  I did a 2 layer imbricating anterior repair not imbricating the bladder neck. The reduction of the cystocele was excellent. The repair was very anatomic and I maintained good length anteriorly  I took down my retractor and cystoscoped the patient. There was an excellent cystocele repair and excellent yellow jets bilaterally. The ureters were not distorted. I was very pleased with the repair  Off traction I inspected the repair and again her vaginal cuff was well supported. She did not need a vault suspension. There was excellent reduction of the  cystocele and her tissues were quite healthy utilized in the repair  I trimmed a minimal amount of anterior vaginal wall mucosa and closed the anterior vaginal wall with running 2-0 Vicryl on a CT1 needle. A vaginal pack with Estrace cream was applied. Leg position was excellent. Urine output was good. Blood loss was less than 50 mL.  Hopefully the patient will reacher treatment goal. In spite of this being a redo she did not need a vault suspension or graft

## 2017-03-19 NOTE — Progress Notes (Signed)
PVR measuring 0cc's of urine.

## 2017-03-19 NOTE — Transfer of Care (Signed)
Immediate Anesthesia Transfer of Care Note  Patient: Julie Acevedo  Procedure(s) Performed: Procedure(s): ANTERIOR REPAIR (CYSTOCELE) (N/A) CYSTOSCOPY (N/A)  Patient Location: PACU  Anesthesia Type:General  Level of Consciousness:  sedated, patient cooperative and responds to stimulation  Airway & Oxygen Therapy:Patient Spontanous Breathing and Patient connected to face mask oxgen  Post-op Assessment:  Report given to PACU RN and Post -op Vital signs reviewed and stable  Post vital signs:  Reviewed and stable  Last Vitals:  Vitals:   03/19/17 0827  BP: 101/71  Pulse: 64  Resp: 16  Temp: 82.8 C    Complications: No apparent anesthesia complications

## 2017-03-19 NOTE — Progress Notes (Signed)
Minimal vaginal pain Good output See in am ambulate

## 2017-03-19 NOTE — Anesthesia Procedure Notes (Addendum)
Procedure Name: Intubation Date/Time: 03/19/2017 10:49 AM Performed by: Maxwell Caul Pre-anesthesia Checklist: Patient identified, Emergency Drugs available, Suction available and Patient being monitored Patient Re-evaluated:Patient Re-evaluated prior to inductionOxygen Delivery Method: Circle system utilized Preoxygenation: Pre-oxygenation with 100% oxygen Intubation Type: IV induction Ventilation: Mask ventilation without difficulty Laryngoscope Size: 4 and Mac Grade View: Grade I Tube type: Oral Tube size: 7.0 mm Number of attempts: 1 Airway Equipment and Method: Stylet Placement Confirmation: ETT inserted through vocal cords under direct vision,  positive ETCO2 and breath sounds checked- equal and bilateral Secured at: 21 cm Tube secured with: Tape Dental Injury: Teeth and Oropharynx as per pre-operative assessment  Comments: Intubation by EMT student.

## 2017-03-20 ENCOUNTER — Encounter (HOSPITAL_COMMUNITY): Payer: Self-pay | Admitting: Urology

## 2017-03-20 DIAGNOSIS — N8110 Cystocele, unspecified: Secondary | ICD-10-CM | POA: Diagnosis not present

## 2017-03-20 LAB — BASIC METABOLIC PANEL
Anion gap: 7 (ref 5–15)
BUN: 15 mg/dL (ref 6–20)
CO2: 29 mmol/L (ref 22–32)
Calcium: 9.4 mg/dL (ref 8.9–10.3)
Chloride: 102 mmol/L (ref 101–111)
Creatinine, Ser: 0.91 mg/dL (ref 0.44–1.00)
GFR calc Af Amer: >60 mL/min (ref >60)
GFR calc non Af Amer: >60 mL/min (ref >60)
Glucose, Bld: 118 mg/dL — ABNORMAL HIGH (ref 65–99)
Potassium: 4.2 mmol/L (ref 3.5–5.1)
Sodium: 138 mmol/L (ref 135–145)

## 2017-03-20 LAB — HEMOGLOBIN AND HEMATOCRIT, BLOOD
HCT: 29.7 % — ABNORMAL LOW (ref 36.0–46.0)
Hemoglobin: 9.7 g/dL — ABNORMAL LOW (ref 12.0–15.0)

## 2017-03-20 LAB — GLUCOSE, CAPILLARY: Glucose-Capillary: 103 mg/dL — ABNORMAL HIGH (ref 65–99)

## 2017-03-20 NOTE — Progress Notes (Signed)
Went over discharge paperwork with patient and family.  All questions answered.  Prescriptions and discharge summary given.  Pt voided a total of 400cc's of clear yellow urine with PVR measuring 85cc's.  VSS.  Tolerating breakfast and lunch.

## 2017-03-20 NOTE — Progress Notes (Signed)
Vitals normal Labs normal Minimal pain Detailed post op

## 2017-03-20 NOTE — Progress Notes (Signed)
UROLOGY PROGRESS NOTES  Assessment/Plan: Julie Acevedo is a 66 y.o. female with a history of OA, diverticulitis, DM, HTN, cystocele who is s/p anterior colporrhaphy with cystoscopy on 03/19/17 with Dr. Matilde Sprang.   Interval/Plan: AFVSS. 400cc recorded UOP overnight. Cr 0.9. Hb 9.7 (from 9.9 preop).   - Wean oxygen - Discontinue Foley with TOV today - Remove vaginal packing - Ambulate, OOB, IS - Likely discharge home later today   Subjective: Expected vaginal pain overnight. No emesis. Some nausea this morning but tolerating regular diet. No fevers. Not OOB yet.   Objective:  Vital signs in last 24 hours: Temp:  [97.4 F (36.3 C)-98 F (36.7 C)] 97.9 F (36.6 C) (04/04 0531) Pulse Rate:  [56-80] 73 (04/04 0531) Resp:  [13-17] 16 (04/04 0531) BP: (101-128)/(60-74) 112/60 (04/04 0531) SpO2:  [99 %-100 %] 100 % (04/04 0531) Weight:  [60.9 kg (134 lb 4 oz)] 60.9 kg (134 lb 4 oz) (04/03 8676)  04/03 0701 - 04/04 0700 In: 2554.2 [I.V.:2296.7; IV Piggyback:257.5] Out: 575 [Urine:525; Blood:50]    Physical Exam:  General:  well-developed and well-nourished female in NAD, lying in bed, alert & oriented, pleasant HEENT: [place]/AT, EOMI, sclera anicteric, hearing grossly intact, no nasal discharge, MMM Respiratory: nonlabored respirations, satting well on RA, symmetrical chest rise Cardiovascular: pulse regular rate & rhythm Abdominal: soft, NTTP, nondistended GU: Foley removed, VP removed, yet to void Extremities: warm, well-perfused, no c/c/e Neuro: no focal deficits   Data Review: Results for orders placed or performed during the hospital encounter of 03/19/17 (from the past 24 hour(s))  Glucose, capillary     Status: None   Collection Time: 03/19/17  8:25 AM  Result Value Ref Range   Glucose-Capillary 96 65 - 99 mg/dL  Glucose, capillary     Status: Abnormal   Collection Time: 03/19/17  1:03 PM  Result Value Ref Range   Glucose-Capillary 102 (H) 65 - 99 mg/dL  Glucose,  capillary     Status: Abnormal   Collection Time: 03/19/17  5:16 PM  Result Value Ref Range   Glucose-Capillary 319 (H) 65 - 99 mg/dL  Glucose, capillary     Status: Abnormal   Collection Time: 03/19/17  9:12 PM  Result Value Ref Range   Glucose-Capillary 137 (H) 65 - 99 mg/dL  Basic metabolic panel     Status: Abnormal   Collection Time: 03/20/17  5:43 AM  Result Value Ref Range   Sodium 138 135 - 145 mmol/L   Potassium 4.2 3.5 - 5.1 mmol/L   Chloride 102 101 - 111 mmol/L   CO2 29 22 - 32 mmol/L   Glucose, Bld 118 (H) 65 - 99 mg/dL   BUN 15 6 - 20 mg/dL   Creatinine, Ser 0.91 0.44 - 1.00 mg/dL   Calcium 9.4 8.9 - 10.3 mg/dL   GFR calc non Af Amer >60 >60 mL/min   GFR calc Af Amer >60 >60 mL/min   Anion gap 7 5 - 15  Hemoglobin and hematocrit, blood     Status: Abnormal   Collection Time: 03/20/17  5:43 AM  Result Value Ref Range   Hemoglobin 9.7 (L) 12.0 - 15.0 g/dL   HCT 29.7 (L) 36.0 - 46.0 %  Glucose, capillary     Status: Abnormal   Collection Time: 03/20/17  7:47 AM  Result Value Ref Range   Glucose-Capillary 103 (H) 65 - 99 mg/dL    Imaging: none

## 2017-03-20 NOTE — Discharge Instructions (Signed)
I have reviewed discharge instructions in detail with the patient. They will follow-up with me or their physician as scheduled. My nurse will also be calling the patients as per protocol. As discussed with Dr. Kerrianne Jeng.  °

## 2017-03-20 NOTE — Care Management Note (Signed)
Case Management Note  Patient Details  Name: Julie Acevedo MRN: [id number] Date of Birth: 07/23/1951  Subjective/Objective:                    Action/Plan:d/c home.   Expected Discharge Date:  03/20/17               Expected Discharge Plan:  Home/Self Care  In-House Referral:     Discharge planning Services  CM Consult  Post Acute Care Choice:    Choice offered to:     DME Arranged:    DME Agency:     HH Arranged:    HH Agency:     Status of Service:  Completed, signed off  If discussed at [employer] of Stay Meetings, dates discussed:    Additional Comments:  Dessa Phi, RN 03/20/2017, 11:21 AM

## 2017-03-25 IMAGING — RF DG ABDOMEN 2V
2 series · 2 of 2 positions shown · non-contrast
Comparison: none

[Series 1: pädiatrie · 1 of 1 slices shown (1 of 2)]
[im 1/1]
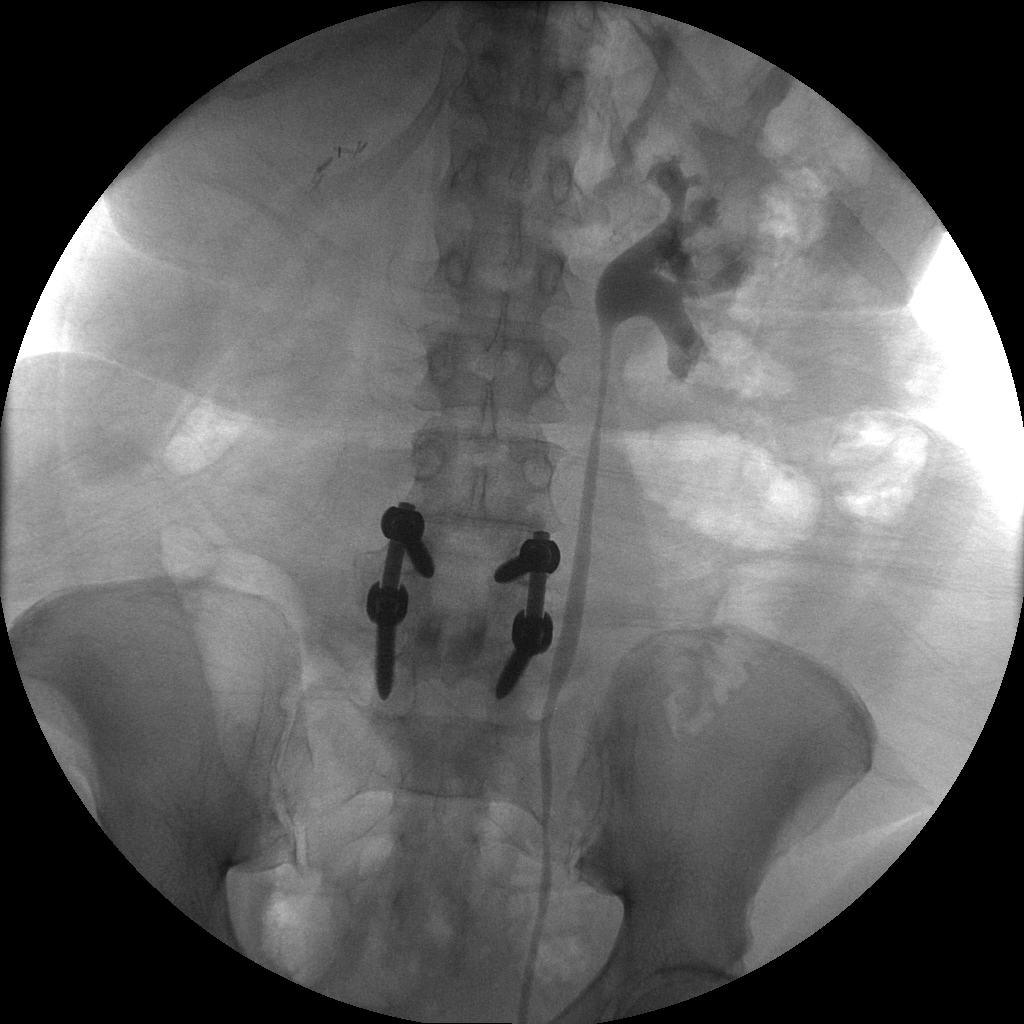

[Series 2: pädiatrie · 1 of 1 slices shown (2 of 2)]
[im 1/1]
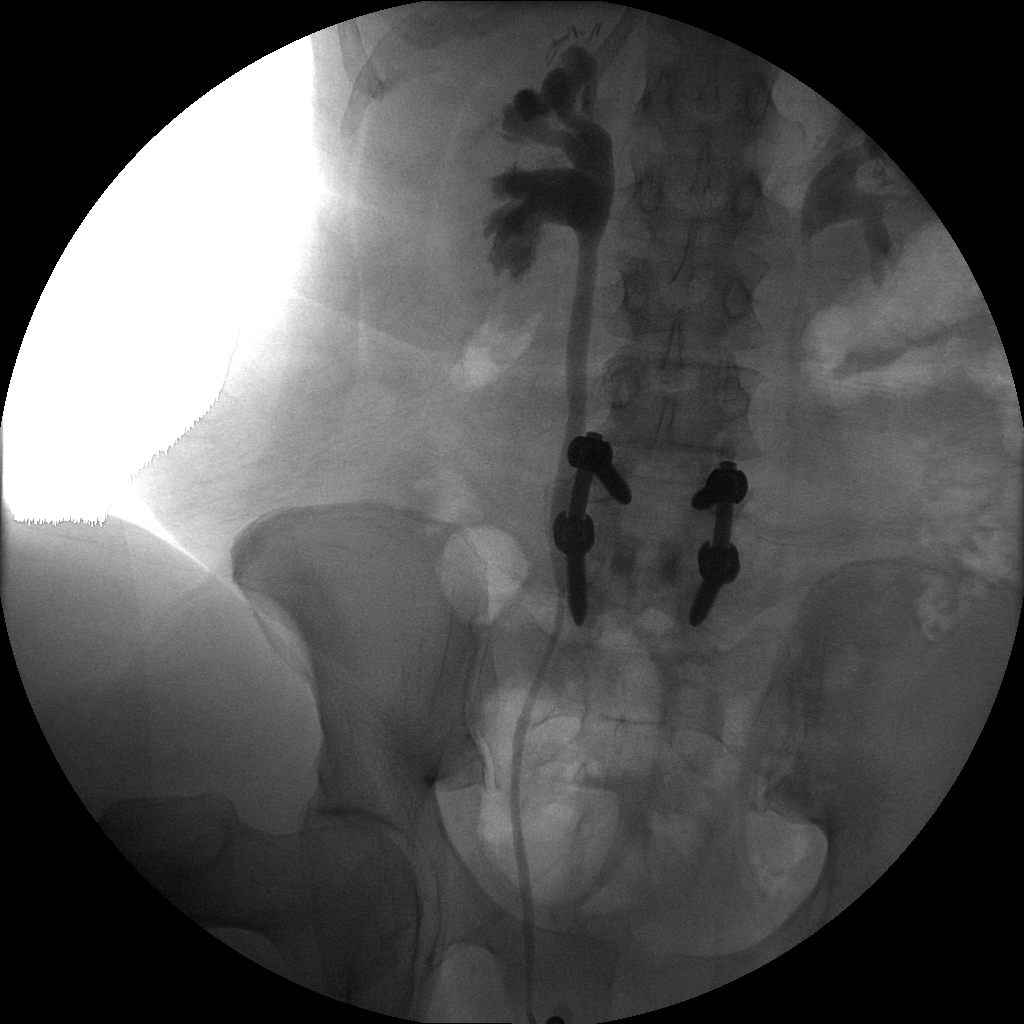

[2 of 2 positions shown; findings below may reference images not displayed]

Canned report from images found in remote index.

Refer to host system for actual result text.

## 2017-05-14 ENCOUNTER — Other Ambulatory Visit: Payer: Self-pay

## 2017-05-14 DIAGNOSIS — M5416 Radiculopathy, lumbar region: Secondary | ICD-10-CM

## 2017-05-14 MED ORDER — GABAPENTIN 100 MG PO CAPS: 200.0000 mg | ORAL_CAPSULE | Freq: Three times a day (TID) | ORAL | 3 refills | Status: DC

## 2017-05-18 NOTE — Addendum Note (Signed)
Addendum  created 05/18/17 0808 by Duane Boston, MD   Sign clinical note

## 2017-06-08 ENCOUNTER — Other Ambulatory Visit: Payer: Self-pay | Admitting: Family Medicine

## 2017-06-08 DIAGNOSIS — I10 Essential (primary) hypertension: Secondary | ICD-10-CM

## 2017-06-10 ENCOUNTER — Encounter: Payer: Self-pay | Admitting: Emergency Medicine

## 2017-06-10 ENCOUNTER — Telehealth: Payer: Self-pay

## 2017-06-10 ENCOUNTER — Emergency Department
Admission: EM | Admit: 2017-06-10 | Discharge: 2017-06-10 | Discharge status: Against Medical Advice | Payer: Medicare PPO | Attending: Emergency Medicine | Admitting: Emergency Medicine

## 2017-06-10 DIAGNOSIS — Z79899 Other long term (current) drug therapy: Secondary | ICD-10-CM | POA: Insufficient documentation

## 2017-06-10 DIAGNOSIS — Z7984 Long term (current) use of oral hypoglycemic drugs: Secondary | ICD-10-CM | POA: Diagnosis not present

## 2017-06-10 DIAGNOSIS — E119 Type 2 diabetes mellitus without complications: Secondary | ICD-10-CM | POA: Diagnosis not present

## 2017-06-10 DIAGNOSIS — R109 Unspecified abdominal pain: Secondary | ICD-10-CM

## 2017-06-10 DIAGNOSIS — I10 Essential (primary) hypertension: Secondary | ICD-10-CM | POA: Diagnosis not present

## 2017-06-10 LAB — COMPREHENSIVE METABOLIC PANEL
ALT: 25 U/L (ref 14–54)
AST: 27 U/L (ref 15–41)
Albumin: 3.5 g/dL (ref 3.5–5.0)
Alkaline Phosphatase: 106 U/L (ref 38–126)
Anion gap: 6 (ref 5–15)
BUN: 18 mg/dL (ref 6–20)
CO2: 32 mmol/L (ref 22–32)
Calcium: 8.9 mg/dL (ref 8.9–10.3)
Chloride: 103 mmol/L (ref 101–111)
Creatinine, Ser: 1.17 mg/dL — ABNORMAL HIGH (ref 0.44–1.00)
GFR calc Af Amer: 56 mL/min — ABNORMAL LOW (ref >60)
GFR calc non Af Amer: 48 mL/min — ABNORMAL LOW (ref >60)
Glucose, Bld: 134 mg/dL — ABNORMAL HIGH (ref 65–99)
Potassium: 3.6 mmol/L (ref 3.5–5.1)
Sodium: 141 mmol/L (ref 135–145)
Total Bilirubin: 0.2 mg/dL — ABNORMAL LOW (ref 0.3–1.2)
Total Protein: 7 g/dL (ref 6.5–8.1)

## 2017-06-10 LAB — CBC
HCT: 28.9 % — ABNORMAL LOW (ref 35.0–47.0)
Hemoglobin: 9.5 g/dL — ABNORMAL LOW (ref 12.0–16.0)
MCH: 27 pg (ref 26.0–34.0)
MCHC: 32.9 g/dL (ref 32.0–36.0)
MCV: 81.9 fL (ref 80.0–100.0)
Platelets: 353 10*3/uL (ref 150–440)
RBC: 3.53 MIL/uL — ABNORMAL LOW (ref 3.80–5.20)
RDW: 15.3 % — ABNORMAL HIGH (ref 11.5–14.5)
WBC: 7.2 10*3/uL (ref 3.6–11.0)

## 2017-06-10 LAB — URINALYSIS, COMPLETE (UACMP) WITH MICROSCOPIC
Bacteria, UA: NONE SEEN
Bilirubin Urine: NEGATIVE
Glucose, UA: NEGATIVE mg/dL
Hgb urine dipstick: NEGATIVE
Ketones, ur: NEGATIVE mg/dL
Nitrite: NEGATIVE
Protein, ur: NEGATIVE mg/dL
Specific Gravity, Urine: 1.019 (ref 1.005–1.030)
pH: 7 (ref 5.0–8.0)

## 2017-06-10 LAB — LIPASE, BLOOD: Lipase: 34 U/L (ref 11–51)

## 2017-06-10 MED ORDER — BARIUM SULFATE 2.1 % PO SUSP
450.0000 mL | ORAL | Status: AC
  Administered 2017-06-10 (×2): 450 mL via ORAL
  Filled 2017-06-10 (×2): qty 450

## 2017-06-10 NOTE — Telephone Encounter (Signed)
Patient called saying that the Julie Acevedo came to her house for a house call, and noticed a knot in her belly. She reports that the knot has been there for about 2-3 weeks, but it has gotten bigger. She reports that the nurse told her that it could be a possible bowel obstruction. Patient wanted to let Dr. Rosanna Randy know this and wanted to know what the next step would be? Contact number is correct. Thanks!

## 2017-06-10 NOTE — Telephone Encounter (Signed)
If she has no pain and having normal bowel movements it is  not a bowel obstruction. Can be seen by someone here at the office if necessary

## 2017-06-10 NOTE — ED Notes (Signed)
Alert, oriented, ambulatory.   Pt states generalized abd pain x 1 week, no BM in a week. States nausea but denies vomiting. Eating and drinking still. Pt states bowel obstruction a year ago. When asked if she had an NG tube or surgery she states "they didn't do anything." pt states her PCP told her to come to ED to be evaluated. Hx gastric bypass, states she does NOT have appendix and gallbladder.

## 2017-06-10 NOTE — ED Notes (Signed)
Went to hand pt second bottle of barium and pt had not started drinking the first bottle of barium. Pt asked if the scan could be done tomorrow. Pt informed she can leave but staff wants to figure out what's going on with her. Pt upset that barium was not in a cup with a straw so pt was provided that. CT informed that pt just now starting barium

## 2017-06-10 NOTE — Telephone Encounter (Signed)
Spoke with patient. Patient states the knot in her abdomen is painful, has gotten bigger since 2 1/2 weeks ago, she is having nausea but no vomiting, she has not had aa bowel movement in a few days but could not say how long, she took stool softener and Miralax. She is passing gas. Consulted with Fenton Malling, PA and sent patient to ER, patient understood.-aa

## 2017-06-10 NOTE — Telephone Encounter (Signed)
Appointment?-aa

## 2017-06-10 NOTE — ED Triage Notes (Signed)
C/O generalized abdominal pain for 2-3 weeks. Patient c/o intermittent nausea.  Denies Vomiting and diarrhea.  Last BM about one week ago.

## 2017-06-10 NOTE — ED Provider Notes (Signed)
[facility] Emergency Department Provider Note  Time seen: 7:19 PM  I have reviewed the triage vital signs and the nursing notes.   HISTORY  Chief Complaint Abdominal Pain    HPI Julie Acevedo is a 66 y.o. female with a past medical history of diabetes, hypertension, presents to the emergency department for abdominal pain. According to the patient for the past 3 weeks she has been expressing intermittent abdominal pain somewhat worse over the past one week. Patient states a history of a bowel obstruction one year ago which resolved without surgical intervention. She states for the past one week she has not had a bowel movement but is still able to pass gas. Denies dysuria. Denies vomiting but does state nausea. States she is not eating very much because she feels very full very quickly. Currently describes her pain as moderate and fairly diffuse without any area of focal pain or tenderness.  Past Medical History:  Diagnosis Date  . Arthritis   . Diverticulitis   . DM (diabetes mellitus) ([place])    typ e 2  . HTN (hypertension)   . Memory difficulties 09/01/2015  . Short-term memory loss     Patient Active Problem List   Diagnosis Date Noted  . 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 Pain at surgical site 08/30/2016  . Ventral incisional hernia 08/21/2016  . SBO (small bowel obstruction) ([place])   . Partial small bowel obstruction ([place]) 04/23/2016  . Adjustment disorder with mixed anxiety and depressed mood 04/23/2016  . Memory difficulties 09/01/2015  . Arthritis 06/11/2015  . Back pain, chronic 06/11/2015  . HLD (hyperlipidemia) 06/11/2015  . Cannot sleep 06/11/2015  . L-S radiculopathy 06/11/2015  . Arthritis of knee, degenerative 06/11/2015  . B12 deficiency 06/11/2015  . 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 DM2 (diabetes mellitus, type 2) ([place]) 11/15/2012  . HTN (hypertension) 01/27/2012   . Adiposity 11/21/2009  . Avitaminosis D 11/07/2009  . Narrowing of intervertebral disc space 07/25/2009  . Benign essential HTN 07/25/2009    Past Surgical History:  Procedure Laterality Date  . ABDOMINAL HYSTERECTOMY     due to endometriosis-1 ovary left  . BREAST EXCISIONAL BIOPSY Right 1971   neg  . CARDIAC CATHETERIZATION  2000   no significant CAD per note of Dr Caryl Comes in 2013   . CHOLECYSTECTOMY    . CYSTOCELE REPAIR N/A 03/19/2017   Procedure: ANTERIOR REPAIR (CYSTOCELE);  Surgeon: Bjorn Loser, MD;  Location: [facility];  Service: Urology;  Laterality: N/A;  . CYSTOSCOPY N/A 03/19/2017   Procedure: CYSTOSCOPY;  Surgeon: Bjorn Loser, MD;  Location: [facility];  Service: Urology;  Laterality: N/A;  . EYE SURGERY    . FOOT SURGERY    . HAND SURGERY    . HEMICOLECTOMY    . INSERTION OF MESH N/A 08/21/2016   Procedure: INSERTION OF MESH;  Surgeon: Excell Seltzer, MD;  Location: [facility];  Service: General;  Laterality: N/A;  . LAPAROSCOPIC LYSIS OF ADHESIONS N/A 08/21/2016   Procedure: LAPAROSCOPIC LYSIS OF ADHESIONS;  Surgeon: Excell Seltzer, MD;  Location: [facility];  Service: General;  Laterality: N/A;  . LUMBAR [place]    . ROUX-EN-Y GASTRIC BYPASS    . TONSILLECTOMY    . UPPER GI ENDOSCOPY  01/12/16   normal larynx, normal esophagus. gastric bypass with a normal-sized pouch and intact staple line, normal examined jejunum, otherwise exam was normal  . VENTRAL HERNIA REPAIR  N/A 08/21/2016   Procedure: LAPAROSCOPIC VENTRAL HERNIA;  Surgeon: Excell Seltzer, MD;  Location: [facility];  Service: General;  Laterality: N/A;    Prior to Admission medications   Medication Sig Start Date End Date Taking? Authorizing Provider  amLODipine (NORVASC) 10 MG tablet TAKE 1 TABLET (10 MG TOTAL) BY MOUTH DAILY. 06/08/17   Jerrol Banana., MD  gabapentin (NEURONTIN) 100 MG capsule Take 2 capsules (200 mg total) by mouth 3 (three) times daily. 05/14/17   Jerrol Banana., MD   hydrochlorothiazide (HYDRODIURIL) 25 MG tablet TAKE 1 TABLET (25 MG TOTAL) BY MOUTH DAILY. 11/30/16   Jerrol Banana., MD  HYDROcodone-acetaminophen (NORCO/VICODIN) 5-325 MG tablet Take 1-2 tablets by mouth every 6 (six) hours as needed for moderate pain or severe pain. 03/19/17   Debbrah Alar, PA-C  Melatonin 10 MG TABS Take 10 mg by mouth at bedtime as needed (for sleep.).     [provider]  metFORMIN (GLUCOPHAGE) 500 MG tablet Take 1 tablet (500 mg total) by mouth 2 (two) times daily with a meal. 10/22/16   Jerrol Banana., MD  metoprolol succinate (TOPROL-XL) 25 MG 24 hr tablet Take 25 mg by mouth daily.    [provider]  zolpidem (AMBIEN) 10 MG tablet Take 1 tablet (10 mg total) by mouth at bedtime as needed for sleep. Patient not taking: Reported on 03/13/2017 02/28/17 03/30/17  Jerrol Banana., MD    Allergies  Allergen Reactions  . Lotrel [Amlodipine Besy-Benazepril Hcl] Anaphylaxis  . Contrast Media [Iodinated Diagnostic Agents] Swelling and Other (See Comments)    Reaction:  Neck swelling   . Niacin Hives  . Sulfa Antibiotics Hives    Family History  Problem Relation Age of Onset  . Cancer Father 94       Lung  . Coronary artery disease Father 32  . COPD Mother   . Diabetes Mother   . Hypertension Mother   . Cancer Paternal Grandmother        Breast  . Breast cancer Paternal Grandmother   . Congestive Heart Failure Maternal Grandmother   . Emphysema Maternal Grandfather   . Heart attack Paternal Grandfather   . Cancer Paternal Aunt        Breast  . Breast cancer Paternal Aunt 44  . Breast cancer Paternal Aunt 77    Social History Social History  Substance Use Topics  . Smoking status: Never Smoker  . Smokeless tobacco: Never Used  . Alcohol use No    Review of Systems Constitutional: Negative for fever. Cardiovascular: Negative for chest pain. Respiratory: Negative for shortness of breath. Gastrointestinal: Positive  for fairly diffuse abdominal pain. Positive for nausea. Negative for vomiting. Positive for constipation times one week. Musculoskeletal: Negative for back pain Neurological: Negative for headache All other ROS negative  ____________________________________________   PHYSICAL EXAM:  VITAL SIGNS: ED Triage Vitals  Enc Vitals Group     BP 06/10/17 1735 123/66     Pulse Rate 06/10/17 1735 62     Resp 06/10/17 1735 16     Temp 06/10/17 1735 98.6 F (37 C)     Temp Source 06/10/17 1735 Oral     SpO2 06/10/17 1735 100 %     Weight 06/10/17 1735 135 lb (61.2 kg)     Height 06/10/17 1735 5\' 3"  (1.6 m)     Head Circumference --      Peak Flow --  Pain Score 06/10/17 1734 10     Pain Loc --      Pain Edu? --      Excl. in [place]? --     Constitutional: Alert and oriented. Well appearing and in no distress. Eyes: Normal exam ENT   Head: Normocephalic and atraumatic.   Mouth/Throat: Mucous membranes are moist. Cardiovascular: Normal rate, regular rhythm. No murmur Respiratory: Normal respiratory effort without tachypnea nor retractions. Breath sounds are clear Gastrointestinal: Soft, moderate fairly diffuse abdominal tenderness without rebound or guarding. No distention. Musculoskeletal: Nontender with normal range of motion in all extremities.  Neurologic:  Normal speech and language. No gross focal neurologic deficits  Skin:  Skin is warm, dry and intact.  Psychiatric: Mood and affect are normal.  ____________________________________________   RADIOLOGY  Patient eloped before CT could be performed.  ____________________________________________   INITIAL IMPRESSION / ASSESSMENT AND PLAN / ED COURSE  Pertinent labs & imaging results that were available during my care of the patient were reviewed by me and considered in my medical decision making (see chart for details).  The patient presents the emergency department with diffuse abdominal pain. Patient does have  moderate tenderness to palpation in all quadrants. No focal area of tenderness identified. Patient does state the upper abdomen is hurting more than the lower abdomen. Patient has a history of a bowel obstruction previously. We'll obtain a CT scan of the abdomen/pelvis to further evaluate. Patient has an IV contrast allergy we will do oral contrast only. Patient does not wish for any pain medication at this time.  patient had started drinking contrast and became upset and says she just wants to leave. Her family is here with her. She states nothing is wrong and she wants to go home. Patient got up and left before we can have her sign any paperwork. It is not clear what is made the patient's upset, it occurred once family had arrived. Patient's lab work was very reassuring. At this time the patient has eloped without further treatment.  ____________________________________________   FINAL CLINICAL IMPRESSION(S) / ED DIAGNOSES  Abdominal pain    Harvest Dark, MD 06/10/17 2055

## 2017-06-10 NOTE — ED Notes (Signed)
Family came in to room, informed family to remind pt to drink barium. A few minutes later pt came walking out and family was behind pt. Pt was stating she wanted to leave. Dr. Kerman Passey was standing beside this RN when pt walked out.

## 2017-06-21 NOTE — Progress Notes (Signed)
This encounter was created in error - please disregard.

## 2017-06-27 ENCOUNTER — Ambulatory Visit: Payer: Self-pay | Admitting: Family Medicine

## 2017-07-24 ENCOUNTER — Other Ambulatory Visit: Payer: Self-pay

## 2017-07-24 DIAGNOSIS — R928 Other abnormal and inconclusive findings on diagnostic imaging of breast: Secondary | ICD-10-CM

## 2017-07-25 ENCOUNTER — Telehealth: Payer: Self-pay | Admitting: Family Medicine

## 2017-07-25 DIAGNOSIS — R928 Other abnormal and inconclusive findings on diagnostic imaging of breast: Secondary | ICD-10-CM

## 2017-07-25 IMAGING — CT CT HEAD W/O CM
1 series · 15 of 30 positions shown, 19 images · non-contrast
Comparison: CT of the head and MRI of the brain performed
10/04/2014

CLINICAL DATA: Acute onset of generalized weakness. Hyperglycemia.
Syncope. Initial encounter.

EXAM:
CT HEAD WITHOUT CONTRAST
TECHNIQUE: Contiguous axial images were obtained from the base of the skull
through the vertex without intravenous contrast.

[Series 2: head wo · axial · 0.42mm/px · z∈[-75,+51]mm · 15 of 32 slices shown, 19 images]
[im 2/32  brain]
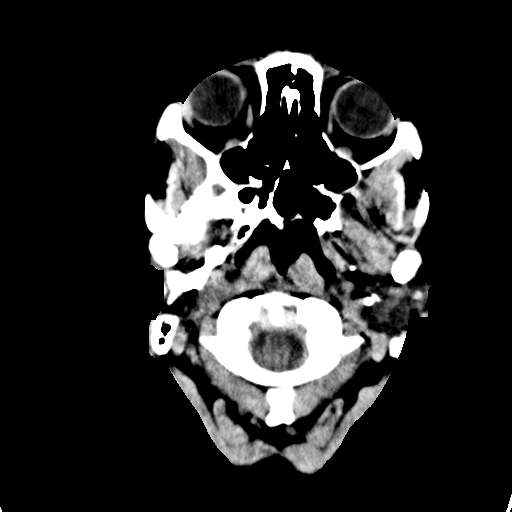
[im 2/32  bone]
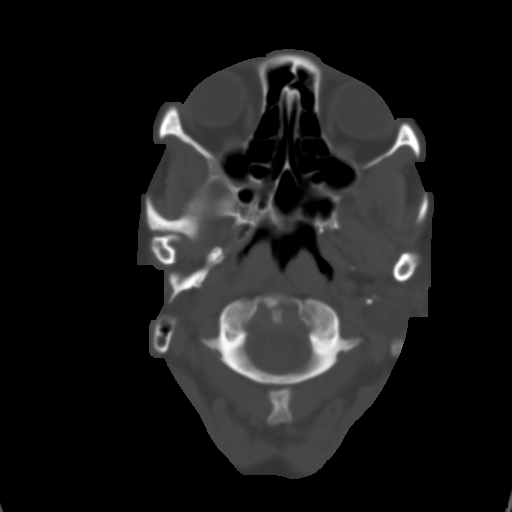
[im 4/32  brain]
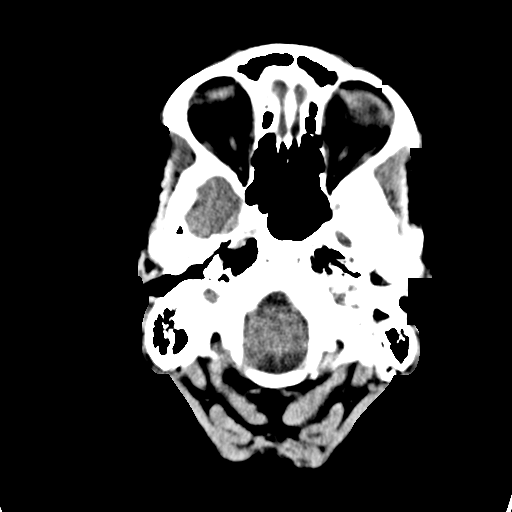
[im 6/32  brain]
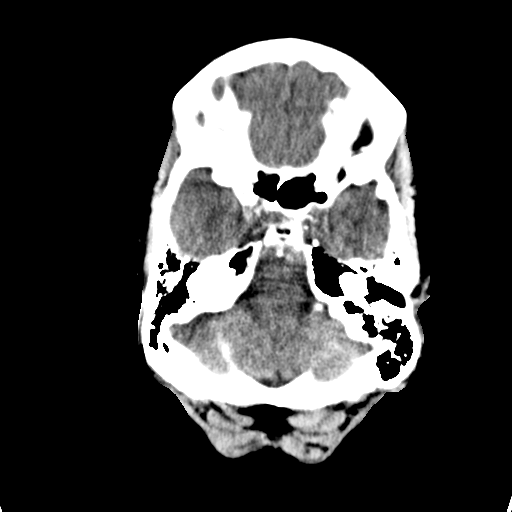
[im 8/32  brain]
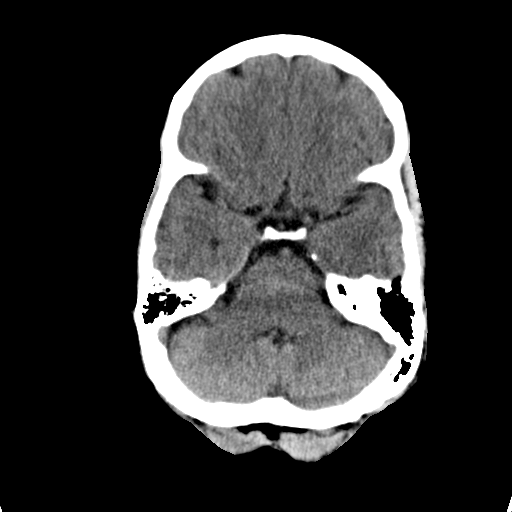
[im 10/32  brain]
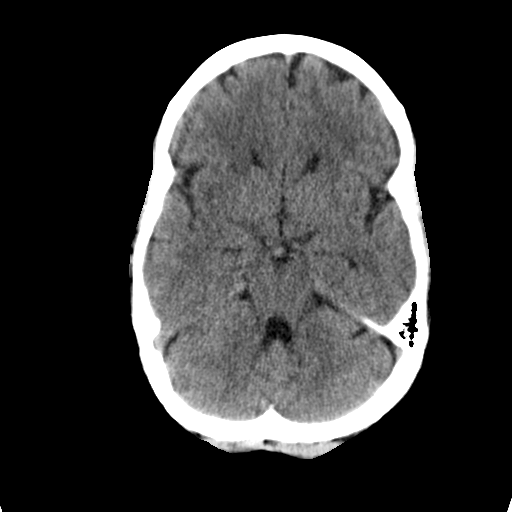
[im 10/32  bone]
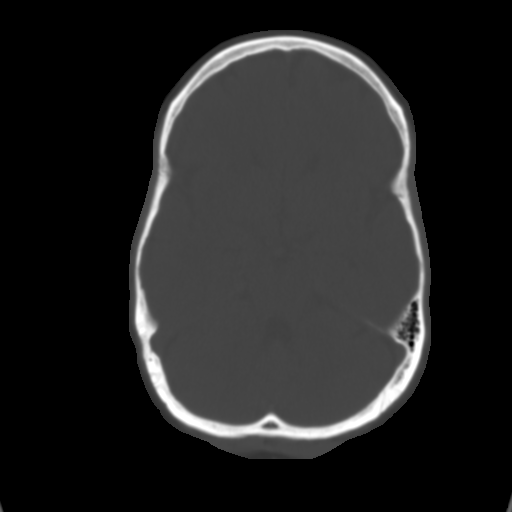
[im 12/32  brain]
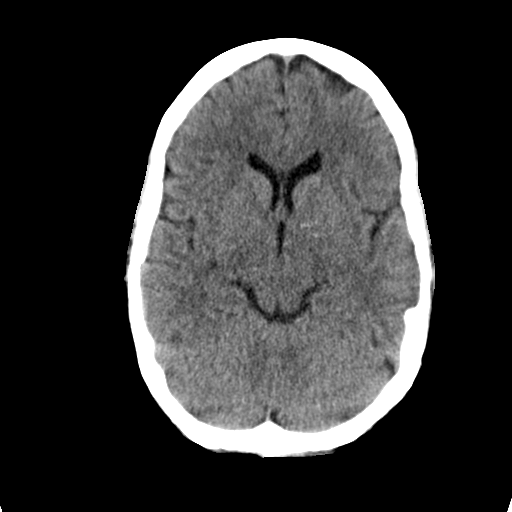
[im 14/32  brain]
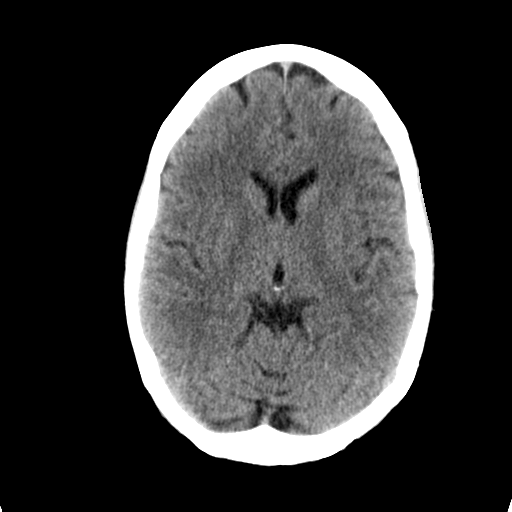
[im 17/32  brain]
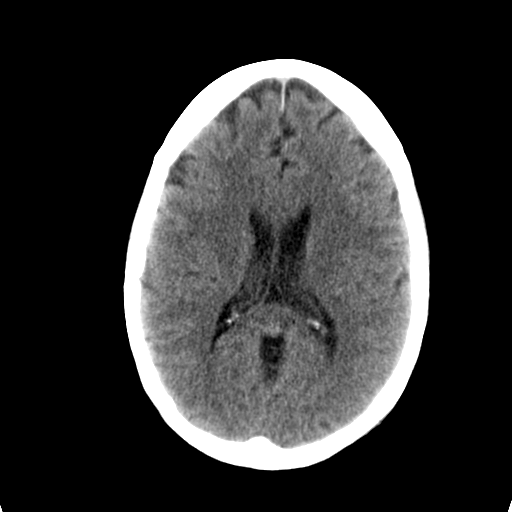
[im 18/32  brain]
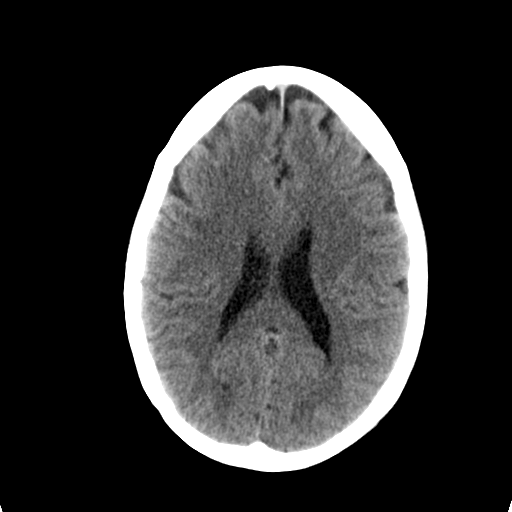
[im 18/32  bone]
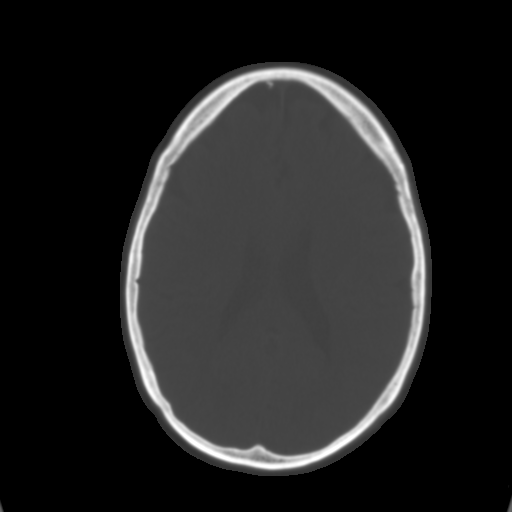
[im 20/32  brain]
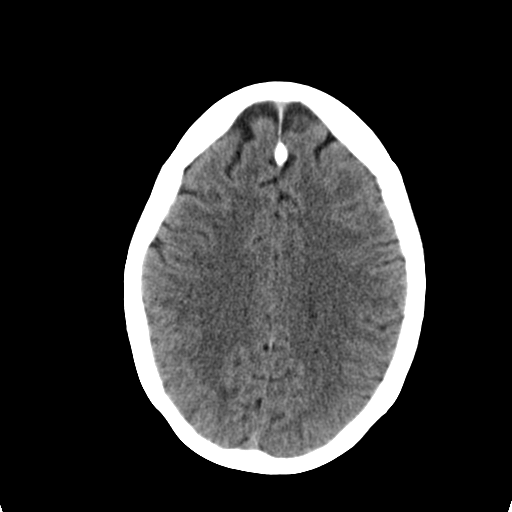
[im 22/32  brain]
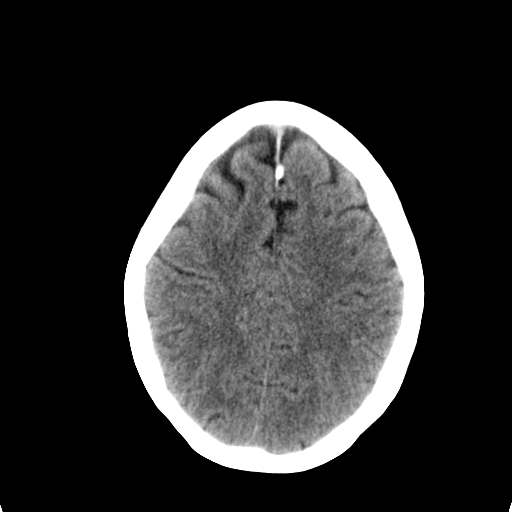
[im 24/32  brain]
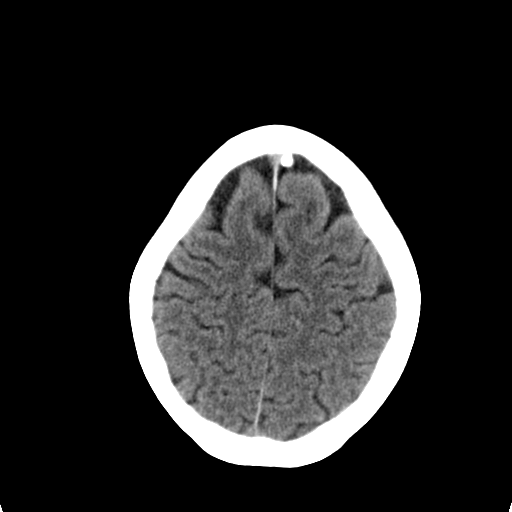
[im 26/32  brain]
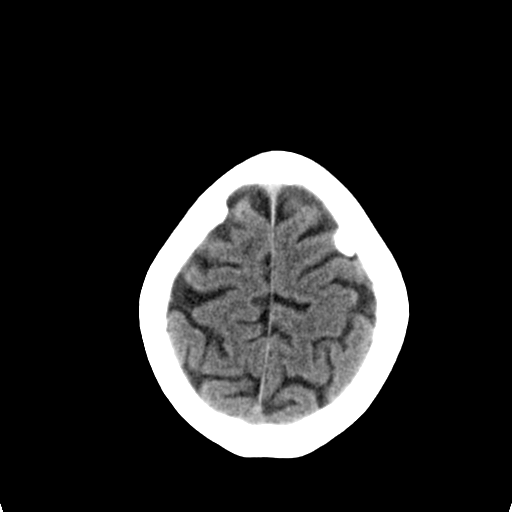
[im 26/32  bone]
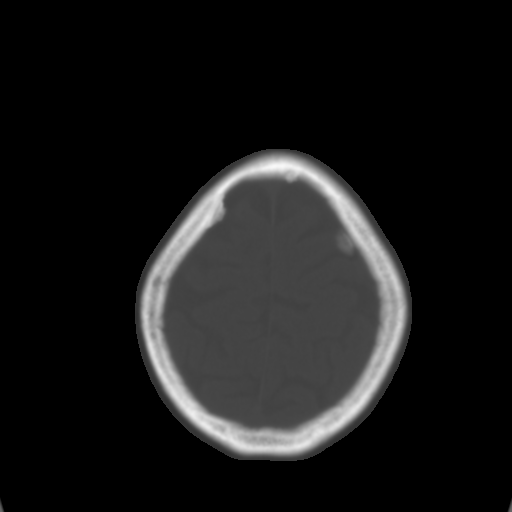
[im 28/32  brain]
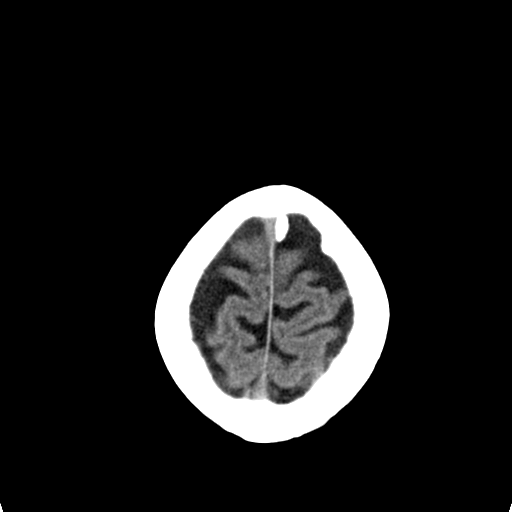
[im 30/32  brain]
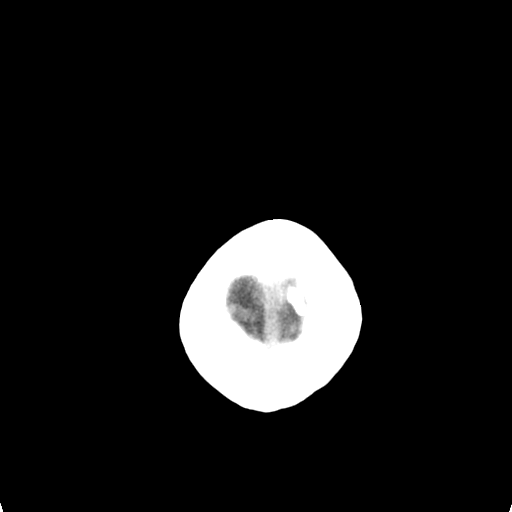

[15 of 30 positions shown; findings below may reference images not displayed]

FINDINGS: There is no evidence of acute infarction, mass lesion, or intra- or
extra-axial hemorrhage on CT.

Mild subcortical white matter change likely reflects small vessel
ischemic microangiopathy.

The posterior fossa, including the cerebellum, brainstem and fourth
ventricle, is within normal limits. The third and lateral
ventricles, and basal ganglia are unremarkable in appearance. The
cerebral hemispheres are symmetric in appearance, with normal
gray-white differentiation. No mass effect or midline shift is seen.

There is no evidence of fracture; visualized osseous structures are
unremarkable in appearance. The visualized portions of the orbits
are within normal limits. The paranasal sinuses and mastoid air
cells are well-aerated. No significant soft tissue abnormalities are
seen.
IMPRESSION: 1. No acute intracranial pathology seen on CT.
2. Mild small vessel ischemic microangiopathy.

## 2017-07-25 NOTE — Telephone Encounter (Signed)
Called to schedule Dx mammogram with [facility]. [place] with [facility] advised that they need the order to be changed to a 3D mammogram Dx code [id number]. Once that is corrected I can call and get pt schedule. Please advise. Thanks TNP

## 2017-07-25 NOTE — Telephone Encounter (Signed)
Order placed. I told patient yesterday 07/24/17 she should be able to call [facility] herself and schedule appointment. -aa

## 2017-07-26 NOTE — Telephone Encounter (Signed)
Spoke with Roselyn Reef @ [facility] and scheduled pt's 3D mammogram for 08/28/17 @ 2:20 pm. I also called pt on home # and pt was advised of appt and to arrive to the appt 15 mins early. Pt voiced understanding. Thanks TNP

## 2017-08-12 LAB — HM DIABETES EYE EXAM

## 2017-08-21 ENCOUNTER — Ambulatory Visit (INDEPENDENT_AMBULATORY_CARE_PROVIDER_SITE_OTHER): Payer: Medicare PPO | Admitting: Family Medicine

## 2017-08-21 ENCOUNTER — Encounter: Payer: Self-pay | Admitting: Family Medicine

## 2017-08-21 VITALS — BP 116/54 | HR 58 | Temp 97.7°F | Resp 16 | Wt 141.0 lb

## 2017-08-21 DIAGNOSIS — R10816 Epigastric abdominal tenderness: Secondary | ICD-10-CM

## 2017-08-21 DIAGNOSIS — G4700 Insomnia, unspecified: Secondary | ICD-10-CM | POA: Diagnosis not present

## 2017-08-21 MED ORDER — TRAZODONE HCL 50 MG PO TABS
50.0000 mg | ORAL_TABLET | Freq: Every evening | ORAL | 3 refills | Status: DC | PRN
Start: 2017-08-21 — End: 2017-09-12

## 2017-08-21 NOTE — Progress Notes (Signed)
Patient: Julie Acevedo Female    DOB: December 24, 1950   66 y.o.   MRN: [id number] Visit Date: 08/21/2017  Today's Provider: [name], MD   Chief Complaint  Patient presents with  . Abdominal Pain    knot in the epigastric area  . Arm Problem   Subjective:    HPI Pt is here today for a knot and tenderness in her epigastric area. She reports that it has been there since about April-march. When she has a bowel movement the knot goes down, she reports that this has been going on since she had the bowel obstruction. She has been having BM regularly.    She also reports that when she is holding on to something or some ones hand her hands will give away or twitch. She says it is like a gripping weakness.       Allergies  Allergen Reactions  . Lotrel [Amlodipine Besy-Benazepril Hcl] Anaphylaxis  . Contrast Media [Iodinated Diagnostic Agents] Swelling and Other (See Comments)    Reaction:  Neck swelling   . Niacin Hives  . Sulfa Antibiotics Hives     Current Outpatient Prescriptions:  .  amLODipine (NORVASC) 10 MG tablet, TAKE 1 TABLET (10 MG TOTAL) BY MOUTH DAILY., Disp: 90 tablet, Rfl: 3 .  gabapentin (NEURONTIN) 100 MG capsule, Take 2 capsules (200 mg total) by mouth 3 (three) times daily., Disp: 540 capsule, Rfl: 3 .  hydrochlorothiazide (HYDRODIURIL) 25 MG tablet, TAKE 1 TABLET (25 MG TOTAL) BY MOUTH DAILY., Disp: 90 tablet, Rfl: 3 .  Melatonin 10 MG TABS, Take 10 mg by mouth at bedtime as needed (for sleep.). , Disp: , Rfl:  .  meloxicam (MOBIC) 15 MG tablet, , Disp: , Rfl:  .  metFORMIN (GLUCOPHAGE) 500 MG tablet, Take 1 tablet (500 mg total) by mouth 2 (two) times daily with a meal., Disp: 180 tablet, Rfl: 3 .  metoprolol succinate (TOPROL-XL) 25 MG 24 hr tablet, Take 25 mg by mouth daily., Disp: , Rfl:  .  HYDROcodone-acetaminophen (NORCO/VICODIN) 5-325 MG tablet, Take 1-2 tablets by mouth every 6 (six) hours as needed for moderate pain or severe pain. (Patient not  taking: Reported on 08/21/2017), Disp: 30 tablet, Rfl: 0 .  zolpidem (AMBIEN) 10 MG tablet, Take 1 tablet (10 mg total) by mouth at bedtime as needed for sleep. (Patient not taking: Reported on 03/13/2017), Disp: 15 tablet, Rfl: 0  Review of Systems  Constitutional: Positive for fatigue.  HENT: Negative.   Eyes: Negative.   Respiratory: Negative.   Cardiovascular: Negative.   Gastrointestinal: Positive for abdominal pain.  Endocrine: Negative.   Genitourinary: Negative.   Musculoskeletal: Positive for back pain.  Allergic/Immunologic: Negative.   Neurological: Negative.   Hematological: Negative.   Psychiatric/Behavioral: Positive for sleep disturbance.    Social History  Substance Use Topics  . Smoking status: Never Smoker  . Smokeless tobacco: Never Used  . Alcohol use No   Objective:   BP (!) 116/54 (BP Location: Left Arm, Patient Position: Sitting, Cuff Size: Large)   Pulse (!) 58   Temp 97.7 F (36.5 C) (Oral)   Resp 16   Wt 141 lb (64 kg)   SpO2 98%   BMI 24.98 kg/m  Vitals:   08/21/17 1500  BP: (!) 116/54  Pulse: (!) 58  Resp: 16  Temp: 97.7 F (36.5 C)  TempSrc: Oral  SpO2: 98%  Weight: 141 lb (64 kg)     Physical Exam  Constitutional: She is oriented to person, place, and time. She appears well-developed and well-nourished.  HENT:  Head: Normocephalic and atraumatic.  Eyes: Conjunctivae are normal. No scleral icterus.  Neck: No thyromegaly present.  Cardiovascular: Normal rate, regular rhythm and normal heart sounds.   Pulmonary/Chest: Effort normal and breath sounds normal.  Abdominal: Soft. She exhibits no distension. There is tenderness. There is no rebound.  Mild epigastric tenderness. No mass effect. No guarding or rebound.  Neurological: She is alert and oriented to person, place, and time.  Skin: Skin is warm and dry.  Psychiatric: She has a normal mood and affect. Her behavior is normal. Judgment and thought content normal.          Assessment & Plan:     1. Epigastric abdominal tenderness, rebound tenderness presence not specified She is not acutely ill. At this time refer back to surgery as she has had incisional hernias. I do not think this is an acute abdomen. I do not think this is pancreatitis. - CBC with Differential/Platelet - Comprehensive metabolic panel - Lipase - Ambulatory referral to General Surgery  2. Insomnia, unspecified type  - traZODone (DESYREL) 50 MG tablet; Take 1 tablet (50 mg total) by mouth at bedtime as needed for sleep.  Dispense: 30 tablet; Refill: 3      I have done the exam and reviewed the above chart and it is accurate to the best of my knowledge. Development worker, community has been used in this note in any air is in the dictation or transcription are unintentional.  [name], MD  [place]

## 2017-08-22 LAB — CBC WITH DIFFERENTIAL/PLATELET
Basophils Absolute: 19 cells/uL (ref 0–200)
Basophils Relative: 0.2 %
Eosinophils Absolute: 182 cells/uL (ref 15–500)
Eosinophils Relative: 1.9 %
HCT: 30.2 % — ABNORMAL LOW (ref 35.0–45.0)
Hemoglobin: 9.6 g/dL — ABNORMAL LOW (ref 11.7–15.5)
Lymphs Abs: 2899 cells/uL (ref 850–3900)
MCH: 25.9 pg — ABNORMAL LOW (ref 27.0–33.0)
MCHC: 31.8 g/dL — ABNORMAL LOW (ref 32.0–36.0)
MCV: 81.4 fL (ref 80.0–100.0)
MPV: 10.1 fL (ref 7.5–12.5)
Monocytes Relative: 6.6 %
Neutro Abs: 5866 cells/uL (ref 1500–7800)
Neutrophils Relative %: 61.1 %
Platelets: 400 10*3/uL (ref 140–400)
RBC: 3.71 10*6/uL — ABNORMAL LOW (ref 3.80–5.10)
RDW: 13.9 % (ref 11.0–15.0)
Total Lymphocyte: 30.2 %
WBC mixed population: 634 cells/uL (ref 200–950)
WBC: 9.6 10*3/uL (ref 3.8–10.8)

## 2017-08-22 LAB — COMPREHENSIVE METABOLIC PANEL
AG Ratio: 1.3 (calc) (ref 1.0–2.5)
ALT: 9 U/L (ref 6–29)
AST: 14 U/L (ref 10–35)
Albumin: 3.9 g/dL (ref 3.6–5.1)
Alkaline phosphatase (APISO): 99 U/L (ref 33–130)
BUN/Creatinine Ratio: 23 (calc) — ABNORMAL HIGH (ref 6–22)
BUN: 27 mg/dL — ABNORMAL HIGH (ref 7–25)
CO2: 28 mmol/L (ref 20–32)
Calcium: 9.4 mg/dL (ref 8.6–10.4)
Chloride: 105 mmol/L (ref 98–110)
Creat: 1.15 mg/dL — ABNORMAL HIGH (ref 0.50–0.99)
Globulin: 3 g/dL (calc) (ref 1.9–3.7)
Glucose, Bld: 95 mg/dL (ref 65–99)
Potassium: 4 mmol/L (ref 3.5–5.3)
Sodium: 143 mmol/L (ref 135–146)
Total Bilirubin: 0.2 mg/dL (ref 0.2–1.2)
Total Protein: 6.9 g/dL (ref 6.1–8.1)

## 2017-08-22 LAB — LIPASE: Lipase: 73 U/L — ABNORMAL HIGH (ref 7–60)

## 2017-08-23 ENCOUNTER — Telehealth: Payer: Self-pay | Admitting: Emergency Medicine

## 2017-08-23 NOTE — Telephone Encounter (Signed)
Pt returning you call

## 2017-09-02 ENCOUNTER — Ambulatory Visit: Payer: Self-pay | Admitting: Family Medicine

## 2017-09-12 ENCOUNTER — Other Ambulatory Visit: Payer: Self-pay | Admitting: Family Medicine

## 2017-09-12 DIAGNOSIS — G4700 Insomnia, unspecified: Secondary | ICD-10-CM

## 2017-09-12 MED ORDER — TRAZODONE HCL 50 MG PO TABS: 50.0000 mg | ORAL_TABLET | Freq: Every evening | ORAL | 3 refills | Status: DC | PRN

## 2017-09-12 NOTE — Telephone Encounter (Signed)
[facility] faxed a request for a 90-days supply for the following medication. Thanks CC  traZODone (DESYREL) 50 MG tablet  >Take 1 tablet ( 50 MG total ) by mouth at bedtime as needed for sleep.

## 2017-10-02 IMAGING — DX DG CHEST 2V
2 series · 2 of 2 positions shown · non-contrast
Comparison: 02/03/2015 .

CLINICAL DATA: Chest pain.

EXAM:
CHEST  2 VIEW

[chest pa]
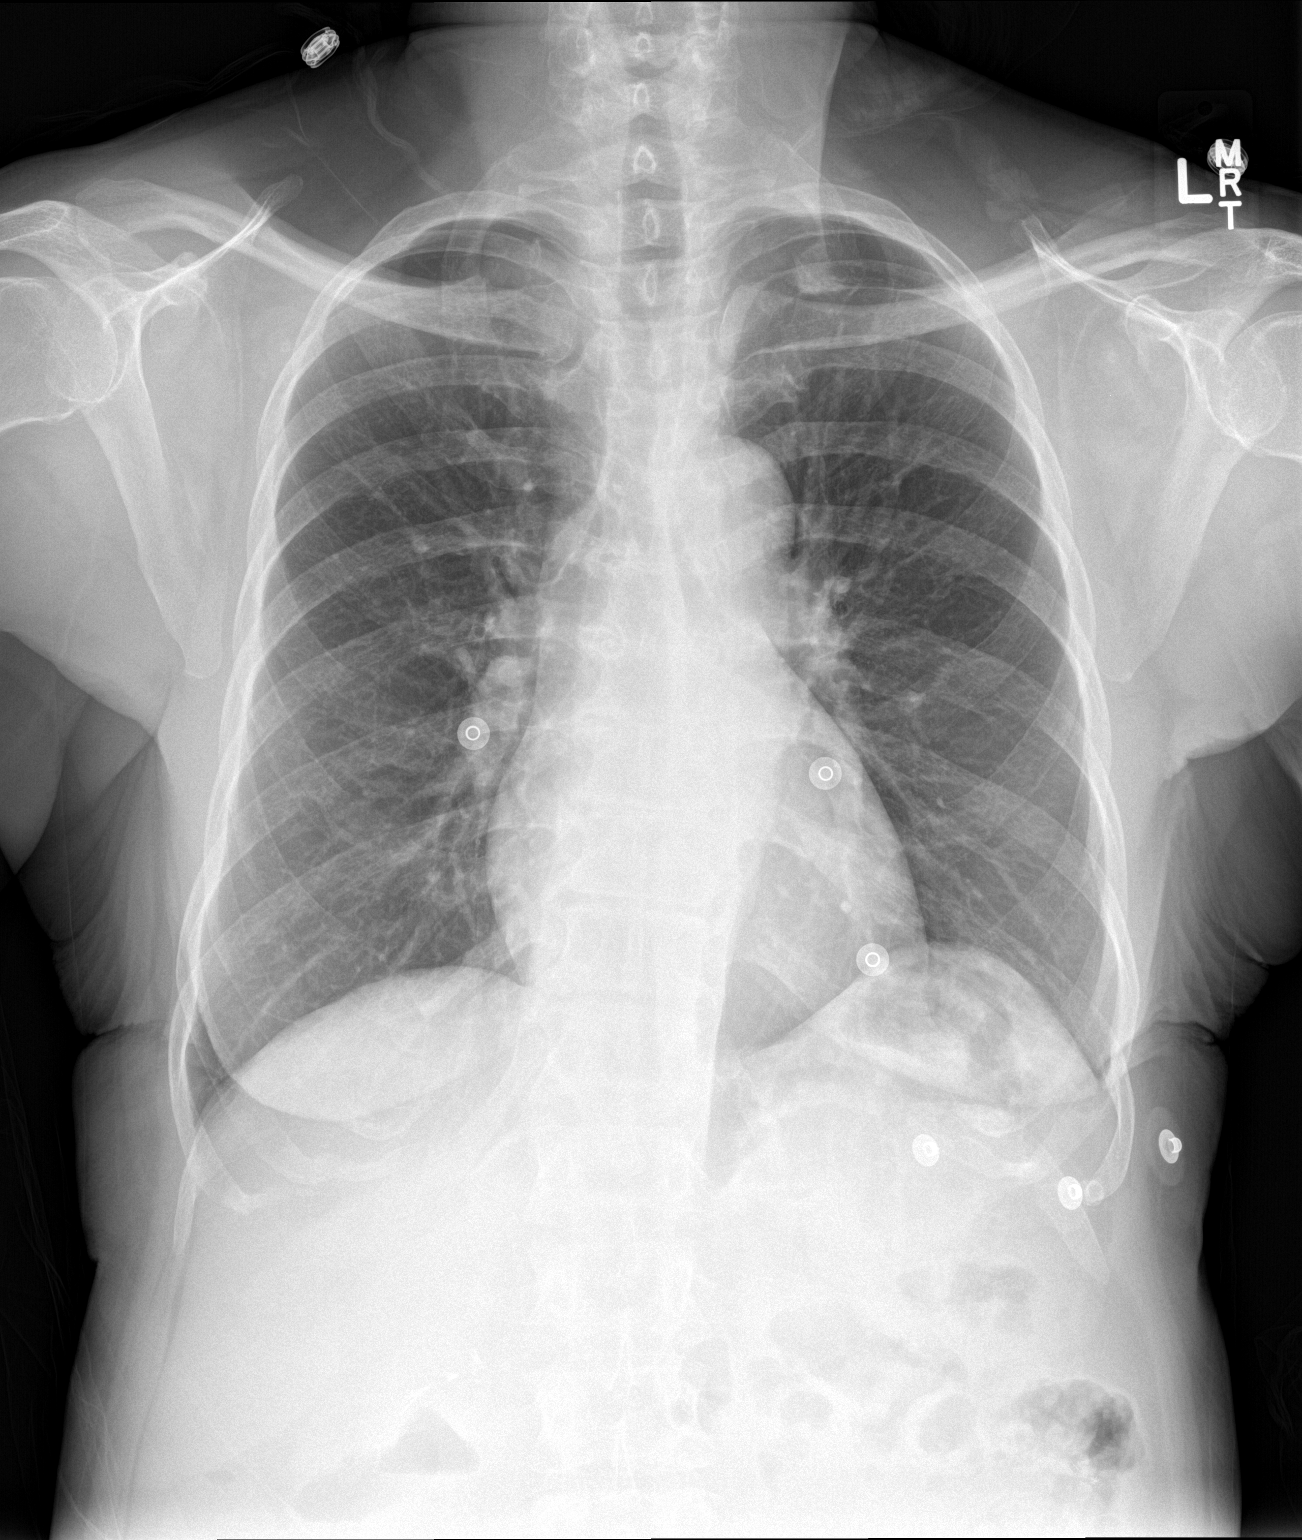

[chest lat]
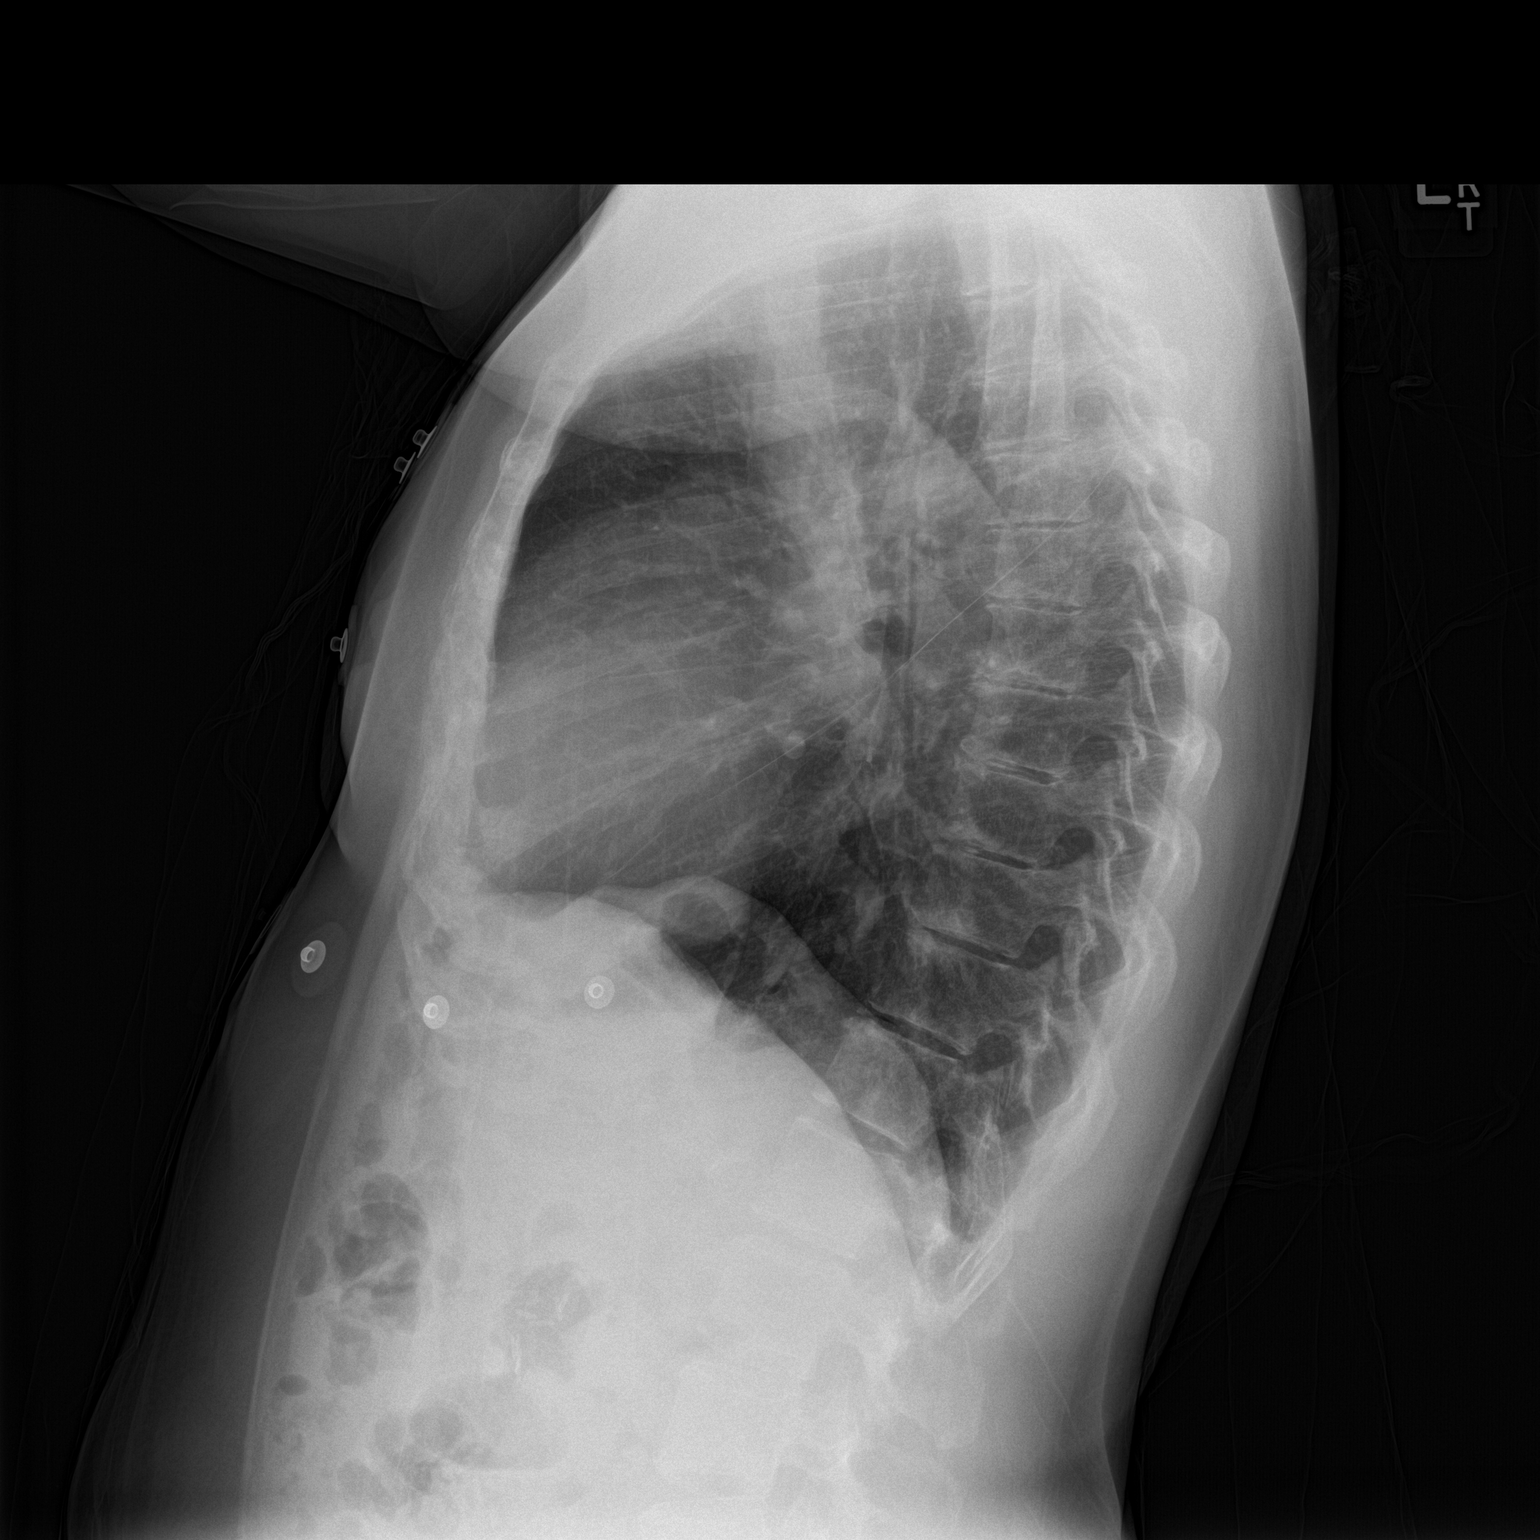

[2 of 2 positions shown; findings below may reference images not displayed]

FINDINGS: Mediastinum hilar structures normal. Lungs are clear. Stable mild
elevation left hemidiaphragm. No pleural effusion pneumothorax.
Heart size is stable. Stable left anterior first rib fracture. No
acute bony abnormality.
IMPRESSION: No acute cardiopulmonary disease.  Stable chest from prior exam.

## 2017-10-04 ENCOUNTER — Other Ambulatory Visit: Payer: Self-pay | Admitting: Family Medicine

## 2017-10-04 MED ORDER — METFORMIN HCL 500 MG PO TABS: 500.0000 mg | ORAL_TABLET | Freq: Two times a day (BID) | ORAL | 3 refills | Status: DC

## 2017-10-04 NOTE — Telephone Encounter (Signed)
Done-Erlene Devita V Shontavia Mickel, RMA  

## 2017-10-04 NOTE — Telephone Encounter (Signed)
[facility] faxed refill request for the following medications:  metFORMIN (GLUCOPHAGE) 500 MG tablet 90 day supply  Last Rx: 10/22/16 LOV: 08/21/17  Dr. Rosanna Randy is out of the office can another provider address the refill request? Please advise. Thanks TNP

## 2017-10-30 ENCOUNTER — Encounter: Payer: Self-pay | Admitting: Family Medicine

## 2017-10-30 ENCOUNTER — Ambulatory Visit: Payer: Medicare PPO | Admitting: Family Medicine

## 2017-10-30 VITALS — BP 110/64 | HR 101 | Temp 98.0°F | Resp 16 | Wt 133.6 lb

## 2017-10-30 DIAGNOSIS — R110 Nausea: Secondary | ICD-10-CM | POA: Diagnosis not present

## 2017-10-30 LAB — CBC WITH DIFFERENTIAL/PLATELET
Basophils Absolute: 18 cells/uL (ref 0–200)
Basophils Relative: 0.2 %
Eosinophils Absolute: 101 cells/uL (ref 15–500)
Eosinophils Relative: 1.1 %
HCT: 29.1 % — ABNORMAL LOW (ref 35.0–45.0)
Hemoglobin: 9.3 g/dL — ABNORMAL LOW (ref 11.7–15.5)
Lymphs Abs: 2576 cells/uL (ref 850–3900)
MCH: 25.5 pg — ABNORMAL LOW (ref 27.0–33.0)
MCHC: 32 g/dL (ref 32.0–36.0)
MCV: 79.9 fL — ABNORMAL LOW (ref 80.0–100.0)
MPV: 9.6 fL (ref 7.5–12.5)
Monocytes Relative: 6.1 %
Neutro Abs: 5943 cells/uL (ref 1500–7800)
Neutrophils Relative %: 64.6 %
Platelets: 490 10*3/uL — ABNORMAL HIGH (ref 140–400)
RBC: 3.64 10*6/uL — ABNORMAL LOW (ref 3.80–5.10)
RDW: 14.4 % (ref 11.0–15.0)
Total Lymphocyte: 28 %
WBC mixed population: 561 cells/uL (ref 200–950)
WBC: 9.2 10*3/uL (ref 3.8–10.8)

## 2017-10-30 LAB — COMPLETE METABOLIC PANEL WITH GFR
AG Ratio: 1.2 (calc) (ref 1.0–2.5)
ALT: 7 U/L (ref 6–29)
AST: 10 U/L (ref 10–35)
Albumin: 3.6 g/dL (ref 3.6–5.1)
Alkaline phosphatase (APISO): 98 U/L (ref 33–130)
BUN/Creatinine Ratio: 15 (calc) (ref 6–22)
BUN: 20 mg/dL (ref 7–25)
CO2: 29 mmol/L (ref 20–32)
Calcium: 9.4 mg/dL (ref 8.6–10.4)
Chloride: 107 mmol/L (ref 98–110)
Creat: 1.35 mg/dL — ABNORMAL HIGH (ref 0.50–0.99)
GFR, Est African American: 48 mL/min/{1.73_m2} — ABNORMAL LOW (ref >=60)
GFR, Est Non African American: 41 mL/min/{1.73_m2} — ABNORMAL LOW (ref >=60)
Globulin: 3 g/dL (calc) (ref 1.9–3.7)
Glucose, Bld: 66 mg/dL (ref 65–99)
Potassium: 4.4 mmol/L (ref 3.5–5.3)
Sodium: 143 mmol/L (ref 135–146)
Total Bilirubin: 0.2 mg/dL (ref 0.2–1.2)
Total Protein: 6.6 g/dL (ref 6.1–8.1)

## 2017-10-30 LAB — LIPASE: Lipase: 38 U/L (ref 7–60)

## 2017-10-30 MED ORDER — ONDANSETRON 4 MG PO TBDP: 4.0000 mg | ORAL_TABLET | Freq: Three times a day (TID) | ORAL | 0 refills | Status: DC | PRN

## 2017-10-30 NOTE — Progress Notes (Signed)
Subjective:     Patient ID: Julie Acevedo, female   DOB: 1951-09-26, 66 y.o.   MRN: [id number] Chief Complaint  Patient presents with  . Nausea    Patient comes in office today with concerns of nausea for the past 3 months. Patient reports that she has nausea throughout the day and has had decreased appetite.    HPI Reports nausea has gotten progressively worse. She states that the smell of food will precipitate it then will get worse after she eats. She has limited herself to soups which are tolerable. Since the September office visit she has lost 8#. Hx of multiple abdominal surgeries and gastric bypass. States she is moving her bowels daily. She has surgery pending for removal of ? painful abdominal seroma or fibroma after prior hernia surgery.  Review of Systems     Objective:   Physical Exam  Constitutional: She appears well-developed and well-nourished. No distress.  Abdominal: Soft. There is tenderness (moderate epigastric). There is no guarding.       Assessment:    1. Daily nausea:  Zofran 4 mg. ODT 3 x day as needed  #21. - COMPLETE METABOLIC PANEL WITH GFR - CBC with Differential/Platelet - Lipase    Plan:    Further f/u pending labs. Probable G.I. Consult.

## 2017-10-30 NOTE — Patient Instructions (Signed)
We will call you with the lab results. 

## 2017-10-31 ENCOUNTER — Other Ambulatory Visit: Payer: Self-pay | Admitting: Family Medicine

## 2017-10-31 DIAGNOSIS — R110 Nausea: Secondary | ICD-10-CM

## 2017-11-04 ENCOUNTER — Ambulatory Visit: Payer: Self-pay | Admitting: Family Medicine

## 2017-11-04 ENCOUNTER — Other Ambulatory Visit: Payer: Self-pay | Admitting: Family Medicine

## 2017-11-04 ENCOUNTER — Ambulatory Visit
Admission: RE | Admit: 2017-11-04 | Discharge: 2017-11-04 | Disposition: A | Discharge status: Home / Self Care | Payer: Medicare PPO | Source: Ambulatory Visit | Attending: Family Medicine | Admitting: Family Medicine

## 2017-11-04 ENCOUNTER — Telehealth: Payer: Self-pay | Admitting: Family Medicine

## 2017-11-04 DIAGNOSIS — R921 Mammographic calcification found on diagnostic imaging of breast: Secondary | ICD-10-CM | POA: Diagnosis not present

## 2017-11-04 DIAGNOSIS — M5416 Radiculopathy, lumbar region: Secondary | ICD-10-CM

## 2017-11-04 DIAGNOSIS — R928 Other abnormal and inconclusive findings on diagnostic imaging of breast: Secondary | ICD-10-CM | POA: Insufficient documentation

## 2017-11-04 NOTE — Telephone Encounter (Signed)
Please review. Thanks!  

## 2017-11-04 NOTE — Telephone Encounter (Signed)
[facility] [address] faxed refill request for following medications: gabapentin (NEURONTIN) 100 MG capsule    Please advise,Thanks [place]

## 2017-11-05 MED ORDER — GABAPENTIN 100 MG PO CAPS: 200.0000 mg | ORAL_CAPSULE | Freq: Three times a day (TID) | ORAL | 1 refills | Status: DC

## 2017-11-12 ENCOUNTER — Telehealth: Payer: Self-pay | Admitting: Family Medicine

## 2017-11-12 NOTE — Telephone Encounter (Signed)
[facility]ed a request for a 90-days supply for the following medication. Thanks CC  gabapentin (NEURONTIN) 100 MG capsule

## 2017-11-12 NOTE — Telephone Encounter (Signed)
Already done on 11/05/17-Kimiko Common V Hagen Tidd, RMA

## 2017-11-18 ENCOUNTER — Ambulatory Visit: Payer: Medicare PPO | Admitting: Family Medicine

## 2017-11-18 VITALS — BP 110/54 | HR 62 | Temp 98.2°F | Resp 14 | Wt 134.8 lb

## 2017-11-18 DIAGNOSIS — G4709 Other insomnia: Secondary | ICD-10-CM | POA: Diagnosis not present

## 2017-11-18 DIAGNOSIS — M9979 Connective tissue and disc stenosis of intervertebral foramina of abdomen and other regions: Secondary | ICD-10-CM

## 2017-11-18 DIAGNOSIS — R739 Hyperglycemia, unspecified: Secondary | ICD-10-CM

## 2017-11-18 DIAGNOSIS — I10 Essential (primary) hypertension: Secondary | ICD-10-CM | POA: Diagnosis not present

## 2017-11-18 DIAGNOSIS — Z9884 Bariatric surgery status: Secondary | ICD-10-CM | POA: Diagnosis not present

## 2017-11-18 DIAGNOSIS — M5442 Lumbago with sciatica, left side: Secondary | ICD-10-CM | POA: Diagnosis not present

## 2017-11-18 DIAGNOSIS — M5441 Lumbago with sciatica, right side: Secondary | ICD-10-CM | POA: Diagnosis not present

## 2017-11-18 DIAGNOSIS — G8929 Other chronic pain: Secondary | ICD-10-CM

## 2017-11-18 LAB — POCT UA - MICROALBUMIN: Microalbumin Ur, POC: 50 mg/L

## 2017-11-18 LAB — POCT GLYCOSYLATED HEMOGLOBIN (HGB A1C): Hemoglobin A1C: 5.8

## 2017-11-18 MED ORDER — METFORMIN HCL 500 MG PO TABS: 500.0000 mg | ORAL_TABLET | Freq: Every day | ORAL | 0 refills | Status: DC

## 2017-11-18 NOTE — Progress Notes (Signed)
Julie Acevedo  MRN: [id number] DOB: June 28, 1951  Subjective:  HPI  Patient is here for 6 months follow up. Hyperglycemia: patient is taking Metformin. Patient checks her sugar once daily or every other day, in the morning. Readings usually around 80-90 but does not get hypoglycemic symptoms. No numbness or tingling present. Lab Results  Component Value Date   HGBA1C 5.8 (H) 03/13/2017   Wt Readings from Last 3 Encounters:  11/18/17 134 lb 12.8 oz (61.1 kg)  10/30/17 133 lb 9.6 oz (60.6 kg)  08/21/17 141 lb (64 kg)   HTN: patient does not check her b/p at home. No cardiac symptoms present. BP Readings from Last 3 Encounters:  11/18/17 (!) 110/54  10/30/17 110/64  08/21/17 (!) 116/54   Back pain/chronic: patient takes Gabapentin and Meloxicam, takes Norco as needed-about once a week. Pain is present.  Insomnia: patient takes Trazodone at bedtime. She usually wakes up around 1 am and can not go back to sleep and sometimes will take Melatonin at that time which does help.  Patient Active Problem List   Diagnosis Date Noted  . 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 Pain at surgical site 08/30/2016  . Ventral incisional hernia 08/21/2016  . SBO (small bowel obstruction) ([place])   . Partial small bowel obstruction ([place]) 04/23/2016  . Adjustment disorder with mixed anxiety and depressed mood 04/23/2016  . Memory difficulties 09/01/2015  . Arthritis 06/11/2015  . Back pain, chronic 06/11/2015  . HLD (hyperlipidemia) 06/11/2015  . Cannot sleep 06/11/2015  . L-S radiculopathy 06/11/2015  . Arthritis of knee, degenerative 06/11/2015  . B12 deficiency 06/11/2015  . 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 HTN (hypertension) 01/27/2012  . Adiposity 11/21/2009  . Avitaminosis D 11/07/2009  . Narrowing of intervertebral disc space 07/25/2009  . Benign essential HTN 07/25/2009    Past Medical History:  Diagnosis Date  .  Arthritis   . Diverticulitis   . DM (diabetes mellitus) ([place])    typ e 2  . HTN (hypertension)   . Memory difficulties 09/01/2015  . Short-term memory loss     Social History   Socioeconomic History  . Marital status: Married    Spouse name: Not on file  . Number of children: 3  . Years of education: 78  . Highest education level: Not on file  Social Needs  . Financial resource strain: Not on file  . Food insecurity - worry: Not on file  . Food insecurity - inability: Not on file  . Transportation needs - medical: Not on file  . Transportation needs - non-medical: Not on file  Occupational History  . Occupation: IT consultant: RETIRED  Tobacco Use  . Smoking status: Never Smoker  . Smokeless tobacco: Never Used  Substance and Sexual Activity  . Alcohol use: No  . Drug use: No  . Sexual activity: No  Other Topics Concern  . Not on file  Social History Narrative   Lives with husband, daughter and granddaughter.;   Patient drinks about 3 cups of caffeine daily.   Patient is left handed.     Outpatient Encounter Medications as of 11/18/2017  Medication Sig  . gabapentin (NEURONTIN) 100 MG capsule Take 2 capsules (200 mg total) by mouth 3 (three) times daily.  . hydrochlorothiazide (HYDRODIURIL) 25 MG tablet TAKE 1 TABLET (25 MG TOTAL) BY MOUTH DAILY.  Marland Kitchen HYDROcodone-acetaminophen (NORCO/VICODIN) 5-325 MG tablet Take 1-2 tablets  by mouth every 6 (six) hours as needed for moderate pain or severe pain.  . Melatonin 10 MG TABS Take 10 mg by mouth at bedtime as needed (for sleep.).   Marland Kitchen meloxicam (MOBIC) 15 MG tablet   . metFORMIN (GLUCOPHAGE) 500 MG tablet Take 1 tablet (500 mg total) by mouth 2 (two) times daily with a meal.  . metoprolol succinate (TOPROL-XL) 25 MG 24 hr tablet Take 25 mg by mouth daily.  . ondansetron (ZOFRAN ODT) 4 MG disintegrating tablet Take 1 tablet (4 mg total) every 8 (eight) hours as needed by mouth for nausea or vomiting.  . traZODone  (DESYREL) 50 MG tablet Take 1 tablet (50 mg total) by mouth at bedtime as needed for sleep.   No facility-administered encounter medications on file as of 11/18/2017.     Allergies  Allergen Reactions  . Lotrel [Amlodipine Besy-Benazepril Hcl] Anaphylaxis  . Contrast Media [Iodinated Diagnostic Agents] Swelling and Other (See Comments)    Reaction:  Neck swelling   . Niacin Hives  . Sulfa Antibiotics Hives    Review of Systems  Constitutional: Negative.   Eyes: Negative.   Respiratory: Negative.   Cardiovascular: Negative.   Gastrointestinal: Positive for abdominal pain and nausea.  Musculoskeletal: Positive for back pain and joint pain.  Skin: Negative.   Neurological: Negative.        Unsteady balance at times.  Endo/Heme/Allergies: Negative.   Psychiatric/Behavioral: Negative.     Objective:  BP (!) 110/54   Pulse 62   Temp 98.2 F (36.8 C)   Resp 14   Wt 134 lb 12.8 oz (61.1 kg)   BMI 23.88 kg/m   Physical Exam  Constitutional: She is oriented to person, place, and time and well-developed, well-nourished, and in no distress.  HENT:  Head: Normocephalic and atraumatic.  Eyes: Conjunctivae are normal. Pupils are equal, round, and reactive to light.  Neck: Normal range of motion. Neck supple.  Cardiovascular: Normal rate, regular rhythm, normal heart sounds and intact distal pulses. Exam reveals no gallop.  No murmur heard. Pulmonary/Chest: Effort normal and breath sounds normal. She has no wheezes.  Abdominal: Soft.  Musculoskeletal: She exhibits no edema.  Neurological: She is alert and oriented to person, place, and time.  Skin: Skin is warm and dry.  Psychiatric: Mood, memory, affect and judgment normal.   Diabetic Foot Exam - Simple   Simple Foot Form Diabetic Foot exam was performed with the following findings:  Yes 11/18/2017  9:26 AM  Visual Inspection No deformities, no ulcerations, no other skin breakdown bilaterally:  Yes Sensation Testing Intact to  touch and monofilament testing bilaterally:  Yes Pulse Check Posterior Tibialis and Dorsalis pulse intact bilaterally:  Yes Comments    Assessment and Plan :  1. Other insomnia Stable at this time. Follow.  2. Chronic bilateral low back pain with bilateral sciatica Present, stable for now.  3. Narrowing of intervertebral disc space 4. Hyperglycemia 5.8 stable. Decrease Metformin to 1 tablet once daily instead of twice daily. Watch for hypoglycemic symptoms/episodes. - POCT HgB A1C - POCT UA - Microalbumin  5. Benign essential HTN Stable. Continue current medication.  6. Gastric bypass status for obesity Stable.  HPI, Exam and A&P transcribed by Tiffany Kocher, RMA under direction and in the presence of Miguel Aschoff, MD. I have done the exam and reviewed the chart and it is accurate to the best of my knowledge. Development worker, community has been used and  any errors in dictation or  transcription are unintentional. Miguel Aschoff M.D. [facility]

## 2017-12-02 ENCOUNTER — Other Ambulatory Visit: Payer: Self-pay | Admitting: Family Medicine

## 2017-12-02 DIAGNOSIS — I10 Essential (primary) hypertension: Secondary | ICD-10-CM

## 2017-12-18 IMAGING — CT CT ABD-PELV W/O CM
1 of 2 series · 14 of 32 positions shown, 18 images · non-contrast
Comparison: CT dated 01/25/2015

ADDENDUM:
Please note date is a typographical error in the impression of the
report. The correct impression should read: Apparent thickening of
the jejunal folds. Correlation with clinical exam is recommended to
evaluate for enteritis.
CLINICAL DATA: 64-year-old female with gastric bypass surgery in
7101 presenting with upper abdominal pain.

EXAM:
CT ABDOMEN AND PELVIS WITHOUT CONTRAST
TECHNIQUE: Multidetector CT imaging of the abdomen and pelvis was performed
following the standard protocol without IV contrast.

[Series 2: routine abd pel without · axial · non-contrast · 0.77mm/px · z∈[-478,-82]mm · 14 of 87 slices shown, 18 images]
[im 4/87  soft-tissue]
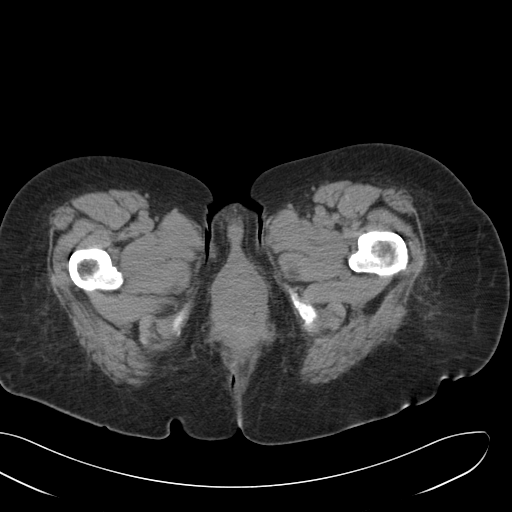
[im 4/87  bone]
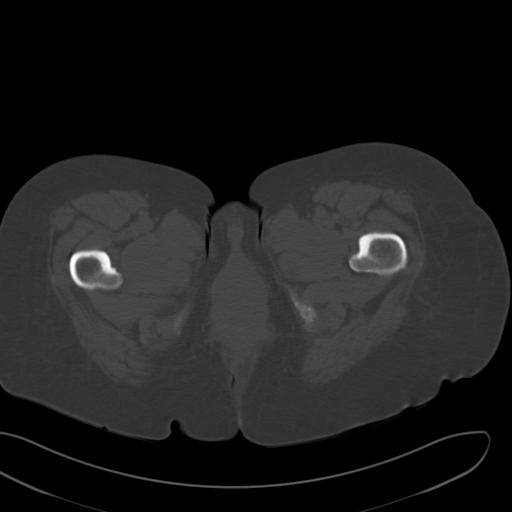
[im 11/87  soft-tissue]
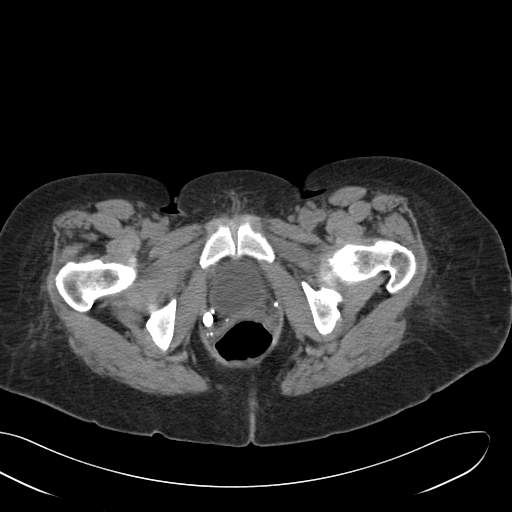
[im 18/87  soft-tissue]
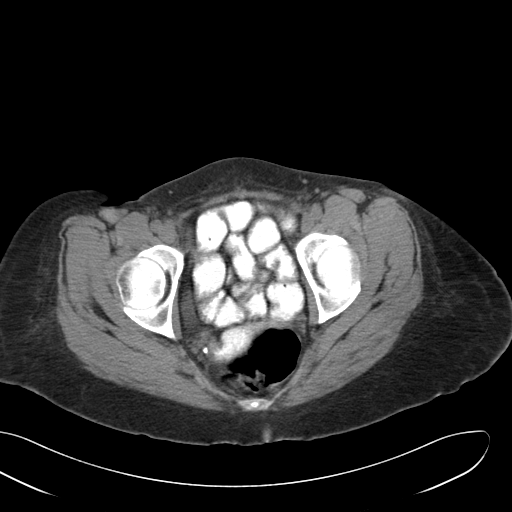
[im 26/87  soft-tissue]
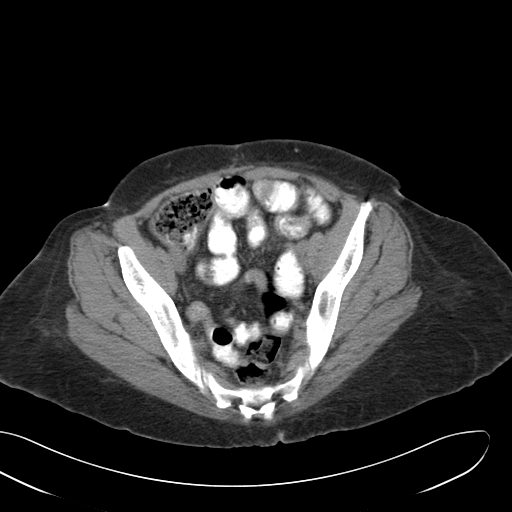
[im 33/87  soft-tissue]
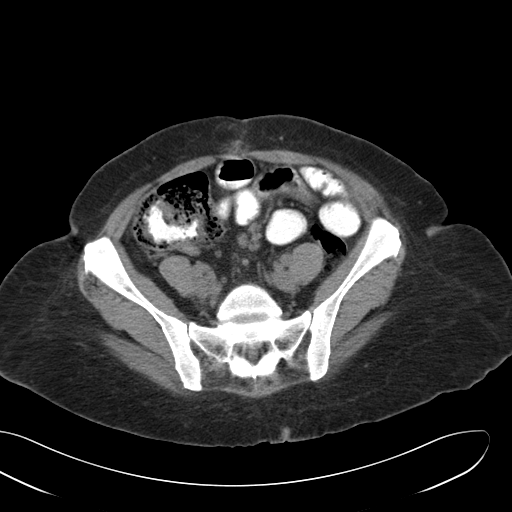
[im 40/87  soft-tissue]
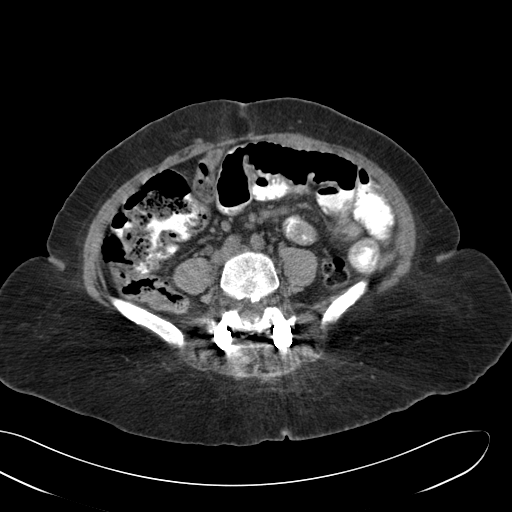
[im 47/87  soft-tissue]
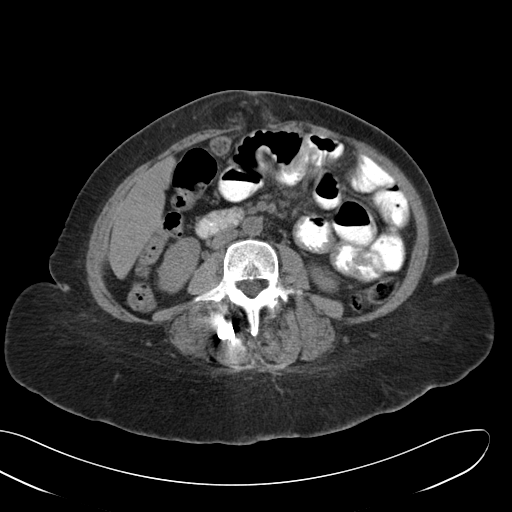
[im 54/87  soft-tissue]
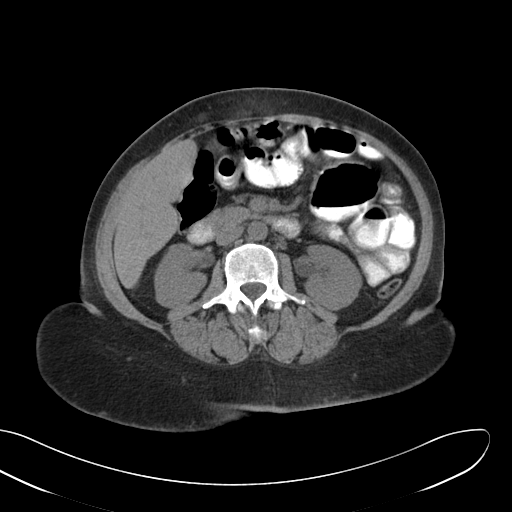
[im 61/87  soft-tissue]
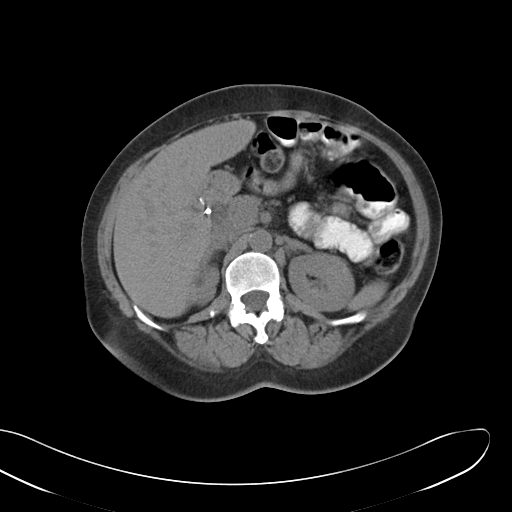
[im 61/87  bone]
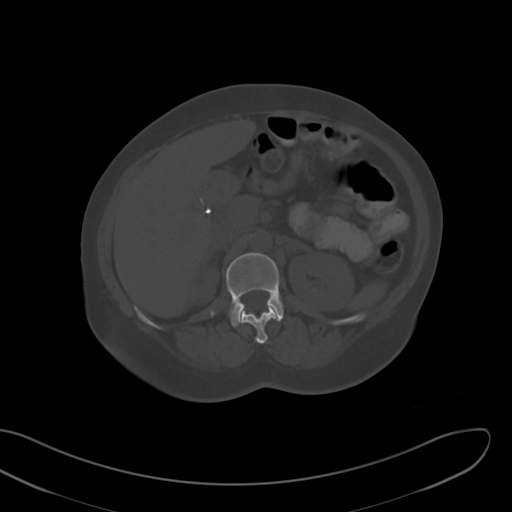
[im 69/87  soft-tissue]
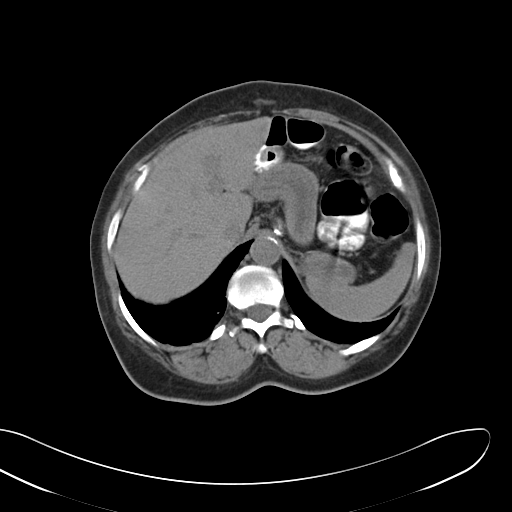
[im 72/87  lung]
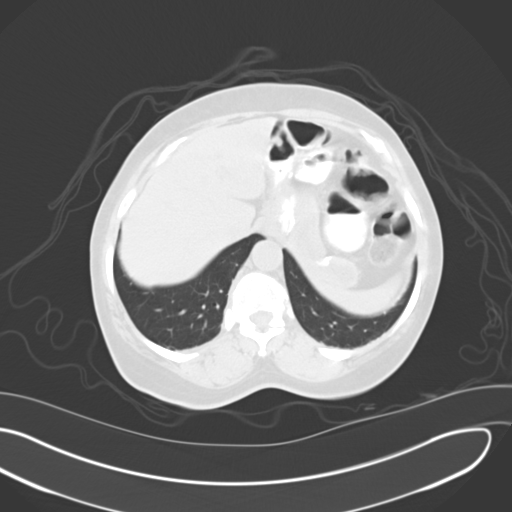
[im 76/87  soft-tissue]
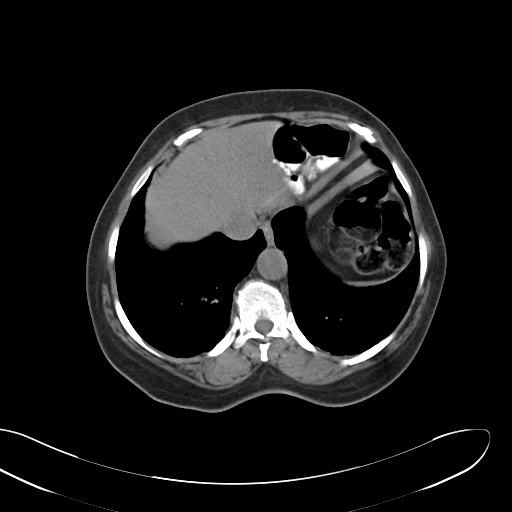
[im 76/87  lung]
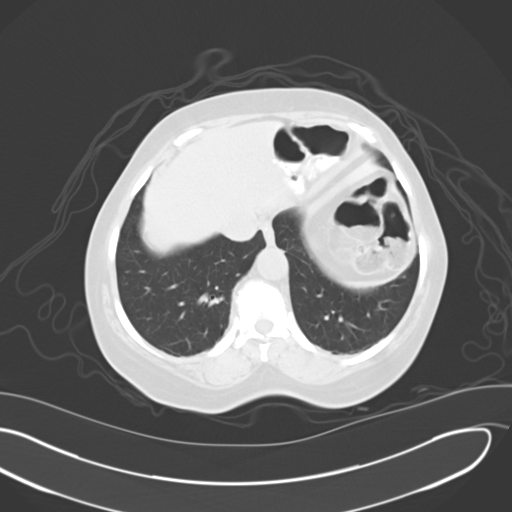
[im 79/87  lung]
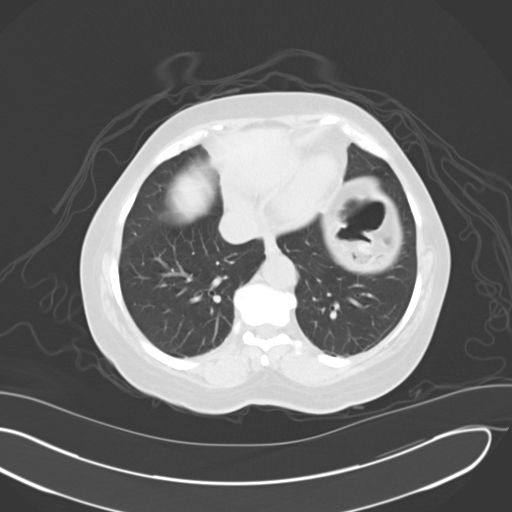
[im 83/87  soft-tissue]
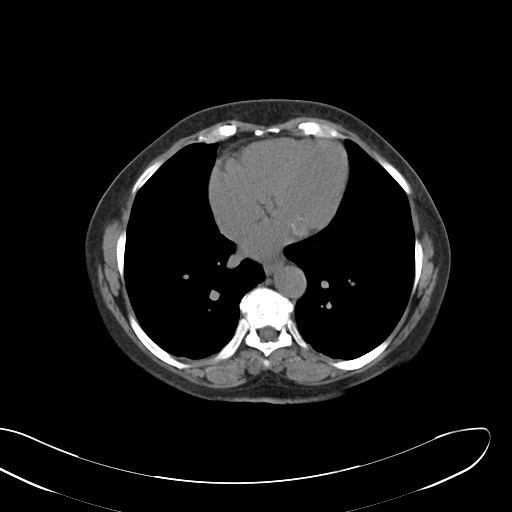
[im 83/87  lung]
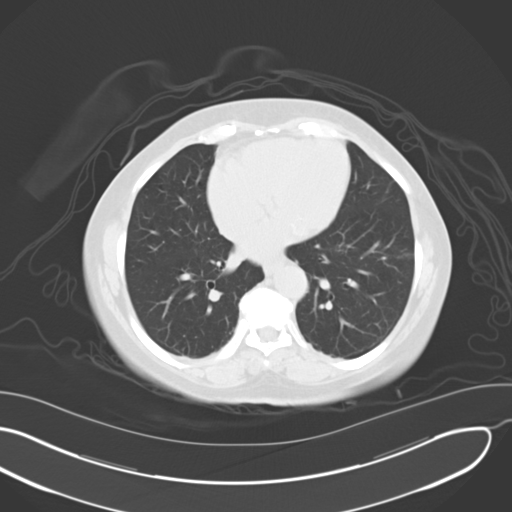

[14 of 32 positions shown; findings below may reference images not displayed]

FINDINGS: Evaluation of this exam is limited in the absence of intravenous
contrast.

The visualized lung bases are clear. There is mild hypoattenuation
of the cardiac blood pool which may represent a degree of anemia.
Clinical correlation is recommended. No intra-abdominal free air or
free fluid identified.

Cholecystectomy. There is irregularity of the hepatic contour.
Clinical correlation is recommended to evaluate for cirrhosis. There
is atrophy of the body of the pancreas. The spleen, and adrenal
glands appear unremarkable. There is mild right hydronephrosis.
There is mild right renal atrophy. The left kidney appears
unremarkable. The visualized ureters and urinary bladder appear
unremarkable. Hysterectomy.

There is sigmoid diverticulosis without active inflammation. This
postsurgical changes of gastric bypass. There is no evidence of
bowel obstruction. Jump mildly thickened appearance of the jejunal
loops in the left upper abdomen. Clinical correlation is recommended
to evaluate for enteritis. The appendix is not visualized with
certainty, possibly surgically absent.

The abdominal aorta and IVC appear grossly unremarkable on
noncontrast study. No portal venous gas identified. There is no
adenopathy.

Midline vertical anterior pelvic wall incisional scar. Small fat
containing supraumbilical hernia as well as small fat containing
umbilical hernia. There is a 1 cm peritoneal defect to the right of
the surgical scar with herniation of small amount of omental fat.
There is no evidence of inflammation. Clinical correlation is
recommended to evaluate for strangulation. No fluid collection.
Degenerative changes of the spine. L4-5 disc spacer and posterior
fixation screws.
IMPRESSION: Mild right hydronephrosis. Correlation with urinalysis recommended
to exclude UTI. No definite stone identified.

No evidence of bowel obstruction. Mild thickening of the duodenal
bulb. Clinical correlation is recommended to evaluate for enteritis.

Small ventral fat containing hernias without evidence of
inflammation. Clinical correlation is recommended.

## 2017-12-27 ENCOUNTER — Encounter: Payer: Self-pay | Admitting: Family Medicine

## 2018-01-23 DIAGNOSIS — E113393 Type 2 diabetes mellitus with moderate nonproliferative diabetic retinopathy without macular edema, bilateral: Secondary | ICD-10-CM | POA: Diagnosis not present

## 2018-01-27 ENCOUNTER — Other Ambulatory Visit: Payer: Self-pay

## 2018-01-27 ENCOUNTER — Ambulatory Visit (INDEPENDENT_AMBULATORY_CARE_PROVIDER_SITE_OTHER): Payer: Medicare HMO | Admitting: Family Medicine

## 2018-01-27 ENCOUNTER — Encounter: Payer: Self-pay | Admitting: General Surgery

## 2018-01-27 VITALS — BP 118/60 | HR 66 | Temp 98.1°F | Resp 14 | Wt 137.0 lb

## 2018-01-27 DIAGNOSIS — E042 Nontoxic multinodular goiter: Secondary | ICD-10-CM

## 2018-01-27 NOTE — Progress Notes (Signed)
Julie Acevedo  MRN: [id number] DOB: 08/31/1951  Subjective:  HPI   The patient is a 67 year old female who states she is here today because her eye doctor told her that the problems she is having with her eyes is coming from her thyroid nodules and she would like referral to surgeon to get them removed.  She said that she called Dr Festus Aloe office and was told she would need a referral.  The patient stated that he had biopsied them last year . She complains that her eyes are bulging, constantly dry and she is unable to sleep.    Patient Active Problem List   Diagnosis Date Noted  . 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 Pain at surgical site 08/30/2016  . Ventral incisional hernia 08/21/2016  . SBO (small bowel obstruction) ([place])   . Partial small bowel obstruction ([place]) 04/23/2016  . Adjustment disorder with mixed anxiety and depressed mood 04/23/2016  . Memory difficulties 09/01/2015  . Arthritis 06/11/2015  . Back pain, chronic 06/11/2015  . HLD (hyperlipidemia) 06/11/2015  . Cannot sleep 06/11/2015  . L-S radiculopathy 06/11/2015  . Arthritis of knee, degenerative 06/11/2015  . B12 deficiency 06/11/2015  . 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 HTN (hypertension) 01/27/2012  . Adiposity 11/21/2009  . Avitaminosis D 11/07/2009  . Narrowing of intervertebral disc space 07/25/2009  . Benign essential HTN 07/25/2009    Past Medical History:  Diagnosis Date  . Arthritis   . Diverticulitis   . DM (diabetes mellitus) ([place])    typ e 2  . HTN (hypertension)   . Memory difficulties 09/01/2015  . Short-term memory loss     Social History   Socioeconomic History  . Marital status: Married    Spouse name: Not on file  . Number of children: 3  . Years of education: 45  . Highest education level: Not on file  Social Needs  . Financial resource strain: Not on file  . Food insecurity - worry: Not on file  .  Food insecurity - inability: Not on file  . Transportation needs - medical: Not on file  . Transportation needs - non-medical: Not on file  Occupational History  . Occupation: IT consultant: RETIRED  Tobacco Use  . Smoking status: Never Smoker  . Smokeless tobacco: Never Used  Substance and Sexual Activity  . Alcohol use: No  . Drug use: No  . Sexual activity: No  Other Topics Concern  . Not on file  Social History Narrative   Lives with husband, daughter and granddaughter.;   Patient drinks about 3 cups of caffeine daily.   Patient is left handed.     Outpatient Encounter Medications as of 01/27/2018  Medication Sig Note  . gabapentin (NEURONTIN) 100 MG capsule Take 2 capsules (200 mg total) by mouth 3 (three) times daily.   . hydrochlorothiazide (HYDRODIURIL) 25 MG tablet TAKE 1 TABLET (25 MG TOTAL) BY MOUTH DAILY.   Marland Kitchen HYDROcodone-acetaminophen (NORCO/VICODIN) 5-325 MG tablet Take 1-2 tablets by mouth every 6 (six) hours as needed for moderate pain or severe pain.   . Melatonin 10 MG TABS Take 10 mg by mouth at bedtime as needed (for sleep.).  01/27/2018: Patient reports she is taking 20 mg at night  . meloxicam (MOBIC) 15 MG tablet    . metFORMIN (GLUCOPHAGE) 500 MG tablet Take 1 tablet (500 mg total) by mouth daily with breakfast.   .  metoprolol succinate (TOPROL-XL) 25 MG 24 hr tablet Take 25 mg by mouth daily.   . ondansetron (ZOFRAN ODT) 4 MG disintegrating tablet Take 1 tablet (4 mg total) every 8 (eight) hours as needed by mouth for nausea or vomiting.   . traZODone (DESYREL) 50 MG tablet Take 1 tablet (50 mg total) by mouth at bedtime as needed for sleep.    No facility-administered encounter medications on file as of 01/27/2018.     Allergies  Allergen Reactions  . Lotrel [Amlodipine Besy-Benazepril Hcl] Anaphylaxis  . Contrast Media [Iodinated Diagnostic Agents] Swelling and Other (See Comments)    Reaction:  Neck swelling   . Niacin Hives  . Sulfa  Antibiotics Hives    Review of Systems  Constitutional: Positive for malaise/fatigue. Negative for fever.  Eyes: Negative.   Respiratory: Negative for cough, shortness of breath and wheezing.   Cardiovascular: Positive for chest pain (chronic ) and palpitations (chronic). Negative for orthopnea, claudication and leg swelling.  Gastrointestinal: Negative.   Skin: Negative.   Neurological: Negative for weakness.  Endo/Heme/Allergies: Negative.   Psychiatric/Behavioral: Negative.     Objective:  BP 118/60 (BP Location: Right Arm, Patient Position: Sitting, Cuff Size: Normal)   Pulse 66   Temp 98.1 F (36.7 C) (Oral)   Resp 14   Wt 137 lb (62.1 kg)   BMI 24.27 kg/m   Physical Exam  Constitutional: She is oriented to person, place, and time and well-developed, well-nourished, and in no distress.  HENT:  Head: Normocephalic and atraumatic.  Right Ear: External ear normal.  Left Ear: External ear normal.  Nose: Nose normal.  Mild R greater than L exophthalmos.  Eyes: Conjunctivae are normal. No scleral icterus.  Neck: Neck supple.  Mild thyromegaly.  Cardiovascular: Normal rate, regular rhythm and normal heart sounds.  Pulmonary/Chest: Effort normal and breath sounds normal.  Abdominal: Soft.  Neurological: She is alert and oriented to person, place, and time. Gait normal. GCS score is 15.  Skin: Skin is warm and dry.  Psychiatric: Mood, memory, affect and judgment normal.    Assessment and Plan :  1. Multiple thyroid nodules  - Thyroid Panel With TSH - Ambulatory referral to General Surgery 2.Mild Exophthalmos With c/o eye irritation. Probably best to see Endocrinology but pt wishes to see surgery as she can see them sooner.   I have done the exam and reviewed the chart and it is accurate to the best of my knowledge. Development worker, community has been used and  any errors in dictation or transcription are unintentional. Miguel Aschoff M.D. [facility]

## 2018-01-28 ENCOUNTER — Telehealth: Payer: Self-pay

## 2018-01-28 LAB — THYROID PANEL WITH TSH
Free Thyroxine Index: 1.6 (ref 1.2–4.9)
T3 Uptake Ratio: 24 % (ref 24–39)
T4, Total: 6.5 ug/dL (ref 4.5–12.0)
TSH: 2.34 u[IU]/mL (ref 0.450–4.500)

## 2018-01-28 NOTE — Telephone Encounter (Signed)
Patient advised as below.  

## 2018-01-28 NOTE — Telephone Encounter (Signed)
-----   Message from Jerrol Banana., MD sent at 01/28/2018 10:57 AM EST ----- Normal lab.

## 2018-02-04 ENCOUNTER — Ambulatory Visit (INDEPENDENT_AMBULATORY_CARE_PROVIDER_SITE_OTHER): Payer: Medicare HMO

## 2018-02-04 VITALS — BP 112/60 | HR 60 | Temp 97.9°F | Ht 63.0 in | Wt 138.0 lb

## 2018-02-04 DIAGNOSIS — Z0000 Encounter for general adult medical examination without abnormal findings: Secondary | ICD-10-CM

## 2018-02-04 NOTE — Patient Instructions (Signed)
[name] , Thank you for taking time to come for your Medicare Wellness Visit. I appreciate your ongoing commitment to your health goals. Please review the following plan we discussed and let me know if I can assist you in the future.   Screening recommendations/referrals: Colonoscopy: Up to date Mammogram: Up to date Bone Density: Up to date Recommended yearly ophthalmology/optometry visit for glaucoma screening and checkup Recommended yearly dental visit for hygiene and checkup  Vaccinations: Influenza vaccine: Up to date Pneumococcal vaccine: N/A Tdap vaccine: Up to date Shingles vaccine: Pt declines today.     Advanced directives: Advance directive discussed with you today. Even though you declined this today please call our office should you change your mind and we can give you the proper paperwork for you to fill out.  Conditions/risks identified: Recommend increasing water intake to 4-6 glasses a day.    Next appointment: 03/25/18 @ 8:40 AM with Dr Rosanna Randy.  Preventive Care 40-64 Years, Female Preventive care refers to lifestyle choices and visits with your health care provider that can promote health and wellness. What does preventive care include?  A yearly physical exam. This is also called an annual well check.  Dental exams once or twice a year.  Routine eye exams. Ask your health care provider how often you should have your eyes checked.  Personal lifestyle choices, including:  Daily care of your teeth and gums.  Regular physical activity.  Eating a healthy diet.  Avoiding tobacco and drug use.  Limiting alcohol use.  Practicing safe sex.  Taking low-dose aspirin daily starting at age 62.  Taking vitamin and mineral supplements as recommended by your health care provider. What happens during an annual well check? The services and screenings done by your health care provider during your annual well check will depend on your age, overall health,  lifestyle risk factors, and family history of disease. Counseling  Your health care provider may ask you questions about your:  Alcohol use.  Tobacco use.  Drug use.  Emotional well-being.  Home and relationship well-being.  Sexual activity.  Eating habits.  Work and work Statistician.  Method of birth control.  Menstrual cycle.  Pregnancy history. Screening  You may have the following tests or measurements:  Height, weight, and BMI.  Blood pressure.  Lipid and cholesterol levels. These may be checked every 5 years, or more frequently if you are over 1 years old.  Skin check.  Lung cancer screening. You may have this screening every year starting at age 10 if you have a 30-pack-year history of smoking and currently smoke or have quit within the past 15 years.  Fecal occult blood test (FOBT) of the stool. You may have this test every year starting at age 15.  Flexible sigmoidoscopy or colonoscopy. You may have a sigmoidoscopy every 5 years or a colonoscopy every 10 years starting at age 21.  Hepatitis C blood test.  Hepatitis B blood test.  Sexually transmitted disease (STD) testing.  Diabetes screening. This is done by checking your blood sugar (glucose) after you have not eaten for a while (fasting). You may have this done every 1-3 years.  Mammogram. This may be done every 1-2 years. Talk to your health care provider about when you should start having regular mammograms. This may depend on whether you have a family history of breast cancer.  BRCA-related cancer screening. This may be done if you have a family history of breast, ovarian, tubal, or peritoneal cancers.  Pelvic  exam and Pap test. This may be done every 3 years starting at age 57. Starting at age 40, this may be done every 5 years if you have a Pap test in combination with an HPV test.  Bone density scan. This is done to screen for osteoporosis. You may have this scan if you are at high risk for  osteoporosis. Discuss your test results, treatment options, and if necessary, the need for more tests with your health care provider. Vaccines  Your health care provider may recommend certain vaccines, such as:  Influenza vaccine. This is recommended every year.  Tetanus, diphtheria, and acellular pertussis (Tdap, Td) vaccine. You may need a Td booster every 10 years.  Zoster vaccine. You may need this after age 52.  Pneumococcal 13-valent conjugate (PCV13) vaccine. You may need this if you have certain conditions and were not previously vaccinated.  Pneumococcal polysaccharide (PPSV23) vaccine. You may need one or two doses if you smoke cigarettes or if you have certain conditions. Talk to your health care provider about which screenings and vaccines you need and how often you need them. This information is not intended to replace advice given to you by your health care provider. Make sure you discuss any questions you have with your health care provider. Document Released: 12/30/2015 Document Revised: 08/22/2016 Document Reviewed: 10/04/2015 Elsevier Interactive Patient Education  2017 [employer] Prevention in the Home Falls can cause injuries. They can happen to people of all ages. There are many things you can do to make your home safe and to help prevent falls. What can I do on the outside of my home?  Regularly fix the edges of walkways and driveways and fix any cracks.  Remove anything that might make you trip as you walk through a door, such as a raised step or threshold.  Trim any bushes or trees on the path to your home.  Use bright outdoor lighting.  Clear any walking paths of anything that might make someone trip, such as rocks or tools.  Regularly check to see if handrails are loose or broken. Make sure that both sides of any steps have handrails.  Any raised decks and porches should have guardrails on the edges.  Have any leaves, snow, or ice cleared  regularly.  Use sand or salt on walking paths during winter.  Clean up any spills in your garage right away. This includes oil or grease spills. What can I do in the bathroom?  Use night lights.  Install grab bars by the toilet and in the tub and shower. Do not use towel bars as grab bars.  Use non-skid mats or decals in the tub or shower.  If you need to sit down in the shower, use a plastic, non-slip stool.  Keep the floor dry. Clean up any water that spills on the floor as soon as it happens.  Remove soap buildup in the tub or shower regularly.  Attach bath mats securely with double-sided non-slip rug tape.  Do not have throw rugs and other things on the floor that can make you trip. What can I do in the bedroom?  Use night lights.  Make sure that you have a light by your bed that is easy to reach.  Do not use any sheets or blankets that are too big for your bed. They should not hang down onto the floor.  Have a firm chair that has side arms. You can use this for support  while you get dressed.  Do not have throw rugs and other things on the floor that can make you trip. What can I do in the kitchen?  Clean up any spills right away.  Avoid walking on wet floors.  Keep items that you use a lot in easy-to-reach places.  If you need to reach something above you, use a strong step stool that has a grab bar.  Keep electrical cords out of the way.  Do not use floor polish or wax that makes floors slippery. If you must use wax, use non-skid floor wax.  Do not have throw rugs and other things on the floor that can make you trip. What can I do with my stairs?  Do not leave any items on the stairs.  Make sure that there are handrails on both sides of the stairs and use them. Fix handrails that are broken or loose. Make sure that handrails are as long as the stairways.  Check any carpeting to make sure that it is firmly attached to the stairs. Fix any carpet that is loose  or worn.  Avoid having throw rugs at the top or bottom of the stairs. If you do have throw rugs, attach them to the floor with carpet tape.  Make sure that you have a light switch at the top of the stairs and the bottom of the stairs. If you do not have them, ask someone to add them for you. What else can I do to help prevent falls?  Wear shoes that:  Do not have high heels.  Have rubber bottoms.  Are comfortable and fit you well.  Are closed at the toe. Do not wear sandals.  If you use a stepladder:  Make sure that it is fully opened. Do not climb a closed stepladder.  Make sure that both sides of the stepladder are locked into place.  Ask someone to hold it for you, if possible.  Clearly mark and make sure that you can see:  Any grab bars or handrails.  First and last steps.  Where the edge of each step is.  Use tools that help you move around (mobility aids) if they are needed. These include:  Canes.  Walkers.  Scooters.  Crutches.  Turn on the lights when you go into a dark area. Replace any light bulbs as soon as they burn out.  Set up your furniture so you have a clear path. Avoid moving your furniture around.  If any of your floors are uneven, fix them.  If there are any pets around you, be aware of where they are.  Review your medicines with your doctor. Some medicines can make you feel dizzy. This can increase your chance of falling. Ask your doctor what other things that you can do to help prevent falls. This information is not intended to replace advice given to you by your health care provider. Make sure you discuss any questions you have with your health care provider. Document Released: 09/29/2009 Document Revised: 05/10/2016 Document Reviewed: 01/07/2015 Elsevier Interactive Patient Education  2017 [employer].

## 2018-02-04 NOTE — Progress Notes (Signed)
Subjective:   Julie Acevedo is a 67 y.o. female who presents for Medicare Annual (Subsequent) preventive examination.  Review of Systems:  N/A  Cardiac Risk Factors include: advanced age (>58men, >71 women);diabetes mellitus;hypertension      Objective:     Vitals: BP 112/60 (BP Location: Left Arm)   Pulse 60   Temp 97.9 F (36.6 C) (Oral)   Ht 5\' 3"  (1.6 m)   Wt 138 lb (62.6 kg)   BMI 24.45 kg/m   Body mass index is 24.45 kg/m.  Advanced Directives 02/04/2018 06/10/2017 03/19/2017 03/13/2017 02/28/2017 09/27/2016 09/14/2016  Does Patient Have a Medical Advance Directive? No No No No No No No  Would patient like information on creating a medical advance directive? No - Patient declined No - Patient declined No - Patient declined No - Patient declined - No - patient declined information Yes - Educational materials given  Pre-existing out of facility DNR order (yellow form or pink MOST form) - - - - - - -    Tobacco Social History   Tobacco Use  Smoking Status Never Smoker  Smokeless Tobacco Never Used     Counseling given: Not Answered   Clinical Intake:  Pre-visit preparation completed: Yes  Pain : No/denies pain Pain Score: 0-No pain     Nutritional Status: BMI of 19-24  Normal Nutritional Risks: None Diabetes: Yes(type 2) CBG done?: No Did pt. bring in CBG monitor from home?: No  How often do you need to have someone help you when you read instructions, pamphlets, or other written materials from your doctor or pharmacy?: 1 - Never  Interpreter Needed?: No  Information entered by :: [facility], LPN  Past Medical History:  Diagnosis Date  . Arthritis   . Diverticulitis   . DM (diabetes mellitus) ([place])    typ e 2  . HTN (hypertension)   . Memory difficulties 09/01/2015  . Short-term memory loss    Past Surgical History:  Procedure Laterality Date  . ABDOMINAL HYSTERECTOMY     due to endometriosis-1 ovary left  . BREAST EXCISIONAL BIOPSY Right 1971   neg  . CARDIAC CATHETERIZATION  2000   no significant CAD per note of Dr Caryl Comes in 2013   . CHOLECYSTECTOMY    . CYSTOCELE REPAIR N/A 03/19/2017   Procedure: ANTERIOR REPAIR (CYSTOCELE);  Surgeon: Bjorn Loser, MD;  Location: [facility];  Service: Urology;  Laterality: N/A;  . CYSTOSCOPY N/A 03/19/2017   Procedure: CYSTOSCOPY;  Surgeon: Bjorn Loser, MD;  Location: [facility];  Service: Urology;  Laterality: N/A;  . EYE SURGERY    . FOOT SURGERY    . HAND SURGERY    . HEMICOLECTOMY    . INSERTION OF MESH N/A 08/21/2016   Procedure: INSERTION OF MESH;  Surgeon: Excell Seltzer, MD;  Location: [facility];  Service: General;  Laterality: N/A;  . LAPAROSCOPIC LYSIS OF ADHESIONS N/A 08/21/2016   Procedure: LAPAROSCOPIC LYSIS OF ADHESIONS;  Surgeon: Excell Seltzer, MD;  Location: [facility];  Service: General;  Laterality: N/A;  . LAPAROSCOPIC REMOVAL ABDOMINAL MASS    . LUMBAR DISC SURGERY    . ROUX-EN-Y GASTRIC BYPASS    . TONSILLECTOMY    . UPPER GI ENDOSCOPY  01/12/16   normal larynx, normal esophagus. gastric bypass with a normal-sized pouch and intact staple line, normal examined jejunum, otherwise exam was normal  . VENTRAL HERNIA REPAIR N/A 08/21/2016   Procedure: LAPAROSCOPIC VENTRAL HERNIA;  Surgeon: Excell Seltzer, MD;  Location: [facility];  Service: General;  Laterality: N/A;   Family History  Problem Relation Age of Onset  . Cancer Father 75       Lung  . Coronary artery disease Father 70  . COPD Mother   . Diabetes Mother   . Hypertension Mother   . Cancer Paternal Grandmother        Breast  . Breast cancer Paternal Grandmother   . Congestive Heart Failure Maternal Grandmother   . Emphysema Maternal Grandfather   . Heart attack Paternal Grandfather   . Cancer Paternal Aunt        Breast  . Breast cancer Paternal Aunt 57  . Breast cancer Paternal Aunt 42   Social History   Socioeconomic History  . Marital status: Married    Spouse name: None  . Number of children: 3  . Years  of education: 5  . Highest education level: Some college, no degree  Social Needs  . Financial resource strain: Not hard at all  . Food insecurity - worry: Never true  . Food insecurity - inability: Never true  . Transportation needs - medical: No  . Transportation needs - non-medical: No  Occupational History  . Occupation: IT consultant: RETIRED  Tobacco Use  . Smoking status: Never Smoker  . Smokeless tobacco: Never Used  Substance and Sexual Activity  . Alcohol use: No  . Drug use: No  . Sexual activity: No  Other Topics Concern  . None  Social History Narrative   Lives with husband, daughter and granddaughter.;   Patient drinks about 3 cups of caffeine daily.   Patient is left handed.     Outpatient Encounter Medications as of 02/04/2018  Medication Sig  . gabapentin (NEURONTIN) 100 MG capsule Take 2 capsules (200 mg total) by mouth 3 (three) times daily.  . hydrochlorothiazide (HYDRODIURIL) 25 MG tablet TAKE 1 TABLET (25 MG TOTAL) BY MOUTH DAILY.  Marland Kitchen HYDROcodone-acetaminophen (NORCO/VICODIN) 5-325 MG tablet Take 1-2 tablets by mouth every 6 (six) hours as needed for moderate pain or severe pain.  . Melatonin 10 MG TABS Take 10 mg by mouth at bedtime as needed (for sleep.).   Marland Kitchen meloxicam (MOBIC) 15 MG tablet   . metFORMIN (GLUCOPHAGE) 500 MG tablet Take 1 tablet (500 mg total) by mouth daily with breakfast.  . metoprolol succinate (TOPROL-XL) 25 MG 24 hr tablet Take 25 mg by mouth daily.  . ondansetron (ZOFRAN ODT) 4 MG disintegrating tablet Take 1 tablet (4 mg total) every 8 (eight) hours as needed by mouth for nausea or vomiting.  . traZODone (DESYREL) 50 MG tablet Take 1 tablet (50 mg total) by mouth at bedtime as needed for sleep.   No facility-administered encounter medications on file as of 02/04/2018.     Activities of Daily Living In your present state of health, do you have any difficulty performing the following activities: 02/04/2018 03/19/2017  Hearing? N  N  Vision? N N  Difficulty concentrating or making decisions? Tempie Donning  Walking or climbing stairs? Y Y  Comment Due to back and hip pain. -  Dressing or bathing? N N  Doing errands, shopping? N N  Preparing Food and eating ? N -  Using the Toilet? N -  In the past six months, have you accidently leaked urine? N -  Do you have problems with loss of bowel control? N -  Managing your Medications? N -  Managing your Finances? N -  Housekeeping or managing your  Housekeeping? N -  Some recent data might be hidden    Patient Care Team: Jerrol Banana., MD as PCP - General (Family Medicine) Lorelee Cover., MD as Consulting Physician (Ophthalmology) Bjorn Loser, MD as Consulting Physician (Urology) Bary Castilla Forest Gleason, MD as Consulting Physician (General Surgery)    Assessment:   This is a routine wellness examination for [place].  Exercise Activities and Dietary recommendations Current Exercise Habits: Home exercise routine, Type of exercise: stretching, Time (Minutes): 15, Frequency (Times/Week): 7, Weekly Exercise (Minutes/Week): 105, Intensity: Mild  Goals    . DIET - INCREASE WATER INTAKE     Recommend increasing water intake to 4-6 glasses a day.        Fall Risk Fall Risk  02/04/2018 02/28/2017  Falls in the past year? No No   Is the patient's home free of loose throw rugs in walkways, pet beds, electrical cords, etc?   yes      Grab bars in the bathroom? yes      Handrails on the stairs?  n/a      Adequate lighting?   yes  Timed Get Up and Go performed: N/A  Depression Screen PHQ 2/9 Scores 02/04/2018 02/28/2017  PHQ - 2 Score 1 0  PHQ- 9 Score - 5     Cognitive Function: Pt declined screening today.  MMSE - Mini Mental State Exam 09/01/2015  Orientation to time 4  Orientation to Place 4  Registration 2  Attention/ Calculation 5  Recall 3  Language- name 2 objects 2  Language- repeat 0  Language- follow 3 step command 2  Language- read & follow  direction 1  Write a sentence 1  Copy design 1  Total score 25     6CIT Screen 12/24/2016  What Year? 0 points  What month? 0 points  What time? 0 points  Count back from 20 0 points  Months in reverse 0 points  Repeat phrase 4 points  Total Score 4    Immunization History  Administered Date(s) Administered  . Influenza Split 10/17/2010, 10/02/2011, 08/27/2012  . Influenza,inj,Quad PF,6+ Mos 09/26/2013, 09/06/2014, 09/06/2016  . Influenza,inj,Quad PF,6-35 Mos 08/31/2017  . Influenza-Unspecified 09/15/2015  . Pneumococcal Polysaccharide-23 11/17/2012  . Tdap 10/02/2011, 09/27/2016  . Zoster 09/06/2016    Qualifies for Shingles Vaccine? Due for Shingles vaccine. Declined my offer to administer today. Education has been provided regarding the importance of this vaccine. Pt has been advised to call her insurance company to determine her out of pocket expense. Advised she may also receive this vaccine at her local pharmacy or Health Dept. Verbalized acceptance and understanding.  Screening Tests Health Maintenance  Topic Date Due  . HEMOGLOBIN A1C  05/19/2018  . OPHTHALMOLOGY EXAM  08/12/2018  . FOOT EXAM  11/18/2018  . URINE MICROALBUMIN  11/18/2018  . MAMMOGRAM  11/05/2019  . COLONOSCOPY  06/04/2023  . TETANUS/TDAP  09/27/2026  . INFLUENZA VACCINE  Completed  . Hepatitis C Screening  Completed  . HIV Screening  Completed    Cancer Screenings: Lung: Low Dose CT Chest recommended if Age 59-80 years, 30 pack-year currently smoking OR have quit w/in 15years. Patient does not qualify. Breast:  Up to date on Mammogram? Yes   Up to date of Bone Density/Dexa? Yes Colorectal: Up to date  Additional Screenings:  Hepatitis C Screening: Up to date HIV: Up to date    Plan:  I have personally reviewed and addressed the Medicare Annual Wellness questionnaire and have noted the  following in the patient's chart:  A. Medical and social history B. Use of alcohol, tobacco or illicit  drugs  C. Current medications and supplements D. Functional ability and status E.  Nutritional status F.  Physical activity G. Advance directives H. List of other physicians I.  Hospitalizations, surgeries, and ER visits in previous 12 months J.  [place] such as hearing and vision if needed, cognitive and depression L. Referrals and appointments - none  In addition, I have reviewed and discussed with patient certain preventive protocols, quality metrics, and best practice recommendations. A written personalized care plan for preventive services as well as general preventive health recommendations were provided to patient.  See attached scanned questionnaire for additional information.   Signed,  Fabio Neighbors, LPN Nurse Health Advisor   Nurse Recommendations:

## 2018-02-06 ENCOUNTER — Ambulatory Visit: Payer: Medicare HMO | Admitting: General Surgery

## 2018-02-20 ENCOUNTER — Inpatient Hospital Stay: Payer: Self-pay

## 2018-02-20 ENCOUNTER — Encounter: Payer: Self-pay | Admitting: General Surgery

## 2018-02-20 ENCOUNTER — Ambulatory Visit: Payer: Medicare HMO | Admitting: General Surgery

## 2018-02-20 VITALS — BP 110/70 | HR 65 | Resp 12 | Ht 63.0 in | Wt 135.0 lb

## 2018-02-20 DIAGNOSIS — E042 Nontoxic multinodular goiter: Secondary | ICD-10-CM | POA: Diagnosis not present

## 2018-02-20 DIAGNOSIS — H0520 Unspecified exophthalmos: Secondary | ICD-10-CM | POA: Diagnosis not present

## 2018-02-20 DIAGNOSIS — E079 Disorder of thyroid, unspecified: Secondary | ICD-10-CM

## 2018-02-20 NOTE — Patient Instructions (Addendum)
The patient is aware to call back for any questions or new concerns. Return as needed. 

## 2018-02-20 NOTE — Progress Notes (Signed)
Patient ID: Julie Acevedo, female   DOB: 03-Jan-1951, 67 y.o.   MRN: [id number]  Chief Complaint  Patient presents with  . Thyroid Nodule    HPI Julie Acevedo is a 67 y.o. female.  Here for evaluation of thyroid nodules referred by Dr Rosanna Randy. She states she has had these nodules for about 3 years, previously followed by Endocrinologist Dr Graceann Congress at [facility]. She states she had a FNA completed  last year.  The patient reports that her optometrist, Izell [place], OD, had suggested that her thyroid be reassessed.  No trouble swallowing but does get hoarse occasionally. Denies any neck pain.  HPI  Past Medical History:  Diagnosis Date  . Arthritis   . Diverticulitis   . DM (diabetes mellitus) ([place])    typ e 2  . HTN (hypertension)   . Memory difficulties 09/01/2015  . Short-term memory loss     Past Surgical History:  Procedure Laterality Date  . ABDOMINAL HYSTERECTOMY     due to endometriosis-1 ovary left  . BREAST EXCISIONAL BIOPSY Right 1971   neg  . CARDIAC CATHETERIZATION  2000   no significant CAD per note of Dr Caryl Comes in 2013   . CHOLECYSTECTOMY    . CYSTOCELE REPAIR N/A 03/19/2017   Procedure: ANTERIOR REPAIR (CYSTOCELE);  Surgeon: Bjorn Loser, MD;  Location: [facility];  Service: Urology;  Laterality: N/A;  . CYSTOSCOPY N/A 03/19/2017   Procedure: CYSTOSCOPY;  Surgeon: Bjorn Loser, MD;  Location: [facility];  Service: Urology;  Laterality: N/A;  . EYE SURGERY    . FOOT SURGERY    . HAND SURGERY    . HEMICOLECTOMY    . INSERTION OF MESH N/A 08/21/2016   Procedure: INSERTION OF MESH;  Surgeon: Excell Seltzer, MD;  Location: [facility];  Service: General;  Laterality: N/A;  . LAPAROSCOPIC LYSIS OF ADHESIONS N/A 08/21/2016   Procedure: LAPAROSCOPIC LYSIS OF ADHESIONS;  Surgeon: Excell Seltzer, MD;  [facility];  Service: General;  Laterality: N/A;  . LAPAROSCOPIC REMOVAL ABDOMINAL MASS    . LUMBAR DISC SURGERY    . ROUX-EN-Y GASTRIC BYPASS  2010   Dr Hassell Done  . TONSILLECTOMY     . UPPER GI ENDOSCOPY  01/12/16   normal larynx, normal esophagus. gastric bypass with a normal-sized pouch and intact staple line, normal examined jejunum, otherwise exam was normal  . VENTRAL HERNIA REPAIR N/A 08/21/2016   Procedure: LAPAROSCOPIC VENTRAL HERNIA;  Surgeon: Excell Seltzer, MD;  [facility];  Service: General;  Laterality: N/A;    Family History  Problem Relation Age of Onset  . Cancer Father 13       Lung  . Coronary artery disease Father 52  . COPD Mother   . Diabetes Mother   . Hypertension Mother   . Cancer Paternal Grandmother        Breast  . Breast cancer Paternal Grandmother   . Congestive Heart Failure Maternal Grandmother   . Emphysema Maternal Grandfather   . Heart attack Paternal Grandfather   . Cancer Paternal Aunt        Breast  . Breast cancer Paternal Aunt 85  . Breast cancer Paternal Aunt 24    Social History Social History   Tobacco Use  . Smoking status: Never Smoker  . Smokeless tobacco: Never Used  Substance Use Topics  . Alcohol use: No  . Drug use: No    Allergies  Allergen Reactions  . Lotrel [Amlodipine Besy-Benazepril Hcl] Anaphylaxis  . Contrast  Media [Iodinated Diagnostic Agents] Swelling and Other (See Comments)    Reaction:  Neck swelling   . Niacin Hives  . Sulfa Antibiotics Hives    Current Outpatient Medications  Medication Sig Dispense Refill  . gabapentin (NEURONTIN) 100 MG capsule Take 2 capsules (200 mg total) by mouth 3 (three) times daily. 540 capsule 1  . hydrochlorothiazide (HYDRODIURIL) 25 MG tablet TAKE 1 TABLET (25 MG TOTAL) BY MOUTH DAILY. 90 tablet 3  . Melatonin 10 MG TABS Take 10 mg by mouth at bedtime as needed (for sleep.).     Marland Kitchen meloxicam (MOBIC) 15 MG tablet     . metFORMIN (GLUCOPHAGE) 500 MG tablet Take 1 tablet (500 mg total) by mouth daily with breakfast. 90 tablet 0  . metoprolol succinate (TOPROL-XL) 25 MG 24 hr tablet Take 25 mg by mouth daily.    . ondansetron (ZOFRAN ODT) 4 MG  disintegrating tablet Take 1 tablet (4 mg total) every 8 (eight) hours as needed by mouth for nausea or vomiting. 21 tablet 0  . traZODone (DESYREL) 50 MG tablet Take 1 tablet (50 mg total) by mouth at bedtime as needed for sleep. 90 tablet 3  . HYDROcodone-acetaminophen (NORCO/VICODIN) 5-325 MG tablet Take 1-2 tablets by mouth every 6 (six) hours as needed for moderate pain or severe pain. (Patient not taking: Reported on 02/20/2018) 30 tablet 0   No current facility-administered medications for this visit.     Review of Systems Review of Systems  Constitutional: Negative.   Respiratory: Negative.   Cardiovascular: Negative.     Blood pressure 110/70, pulse 65, resp. rate 12, height 5\' 3"  (1.6 m), weight 135 lb (61.2 kg).  Physical Exam Physical Exam  Constitutional: She is oriented to person, place, and time. She appears well-developed and well-nourished.  HENT:  Mouth/Throat: Oropharynx is clear and moist.  Eyes: Conjunctivae are normal. No scleral icterus.  Modest exophthalmos appreciated with about 2-3 mm of sclera exposed above and below the iris.  There is no asymmetry noted.  No report of double vision.  Neck: Neck supple.    Cardiovascular: Normal rate, regular rhythm and normal heart sounds.  Pulmonary/Chest: Effort normal and breath sounds normal.  Lymphadenopathy:    She has no cervical adenopathy.       Right: No supraclavicular adenopathy present.       Left: No supraclavicular adenopathy present.  Neurological: She is alert and oriented to person, place, and time.  Skin: Skin is warm and dry.  Psychiatric: Her behavior is normal.    Data Reviewed Endocrinology notes of 2018 reviewed.  FNA sampling of the dominant nodule in the lower pole on the right showed a benign, Bethesda category II pattern.  Thyroid function studies of February 2011, 2019 reviewed.  TSH, T4, T3 and free T4 index all normal.  TSH has been stable dating back to 8 years.  It was elected  to repeat her thyroid ultrasound provide reassurance that the nodule previously evaluated was unchanged and did not require surgical excision.  Right lobe of the thyroid measured 1.3 x 1.69 x 5.2 cm.  There is a dominant nodule in the inferior pole measuring 1.29 x 1.8 x 1.93 cm.  Marked vascularity of the lesion is noted on duplex imaging.  There is a small nodule in the mid/upper pole measuring 0.35 x 0.45 x 0.55 cm.  In the mid body a small 0.34 x 0.39 x 0.54 nodule is appreciated.  The left lobe of the thyroid measures 1.4  x 1.48 x 5.87 cm.  There are 2 small hypoechoic nodules in the midportion measuring 0.39 x 0.51 x 0.67 and 0.26 x 0.34 x 0.37.  No increased vascular flow noted on duplex imaging.  View of the December 13, 2016 ultrasound exam completed at her endocrinologist office showed that the dominant mass measured 1.42 x 1.64 x 1.85 cm at that time.  The present measurements of 1.29 x 1.8 x 1.93 cm are not significantly different.  In light of the normal cytology from February 2018, and no significant interval change over the past year, repeat FNA sampling was not appropriate.   Assessment    Stable thyroid nodules, euthyroid patient.  Exophthalmos.    Plan    I spoke with the patient's optometrist and related the normal thyroid functions in stable ultrasound results.  He will arrange a follow-up exam with the patient.       HPI, Physical Exam, Assessment and Plan have been scribed under the direction and in the presence of Robert Bellow, MD. Julie Fetch, RN   Forest Gleason Yerik Zeringue 02/22/2018, 10:38 AM

## 2018-02-22 DIAGNOSIS — H0520 Unspecified exophthalmos: Secondary | ICD-10-CM | POA: Insufficient documentation

## 2018-02-22 DIAGNOSIS — E042 Nontoxic multinodular goiter: Secondary | ICD-10-CM | POA: Insufficient documentation

## 2018-02-26 IMAGING — MR MR LUMBAR SPINE W/O CM
5 series · 34 of 48 positions shown · non-contrast
Comparison: CT Abdomen and Pelvis 12/14/2015 and earlier. Lumbar
MRI 10/08/2014.

CLINICAL DATA: 64-year-old female with prior lumbar surgery. Pain
radiating to the right lower extremity with numbness. Symptoms
chronic but increasing. Subsequent encounter.

EXAM:
MRI LUMBAR SPINE WITHOUT CONTRAST
TECHNIQUE: Multiplanar, multisequence MR imaging of the lumbar spine was
performed. No intravenous contrast was administered.

[Series 2: T2 · sagittal · 4.0mm · 0.81mm/px · 6 of 17 slices shown (1 of 2)]
[im 1/17]
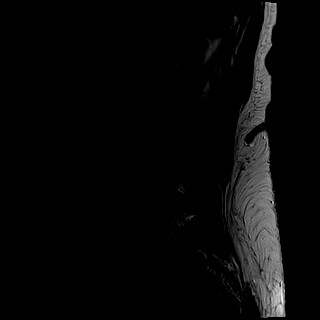
[im 4/17]
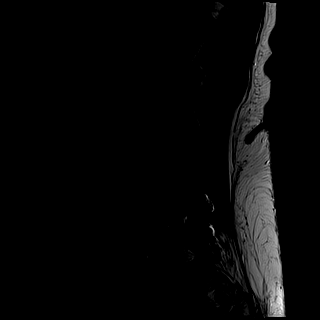
[im 7/17]
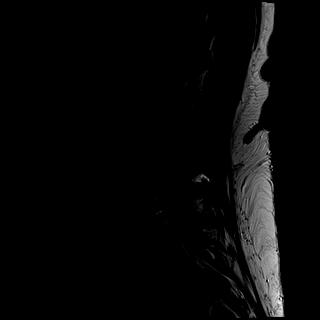
[im 10/17]
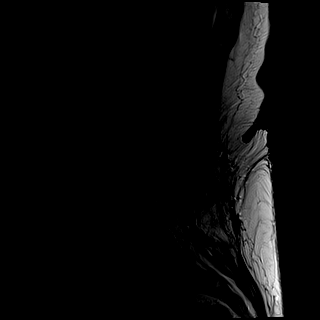
[im 13/17]
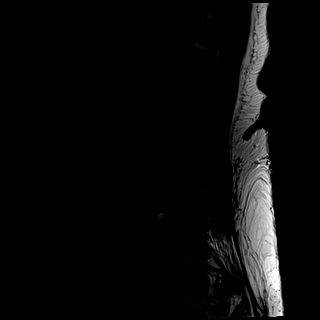
[im 17/17]
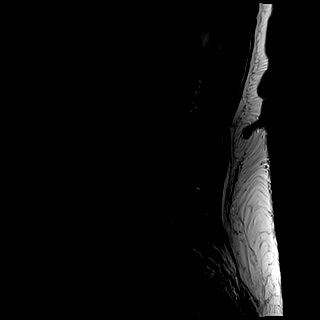

[Series 3: T1 · sagittal · 4.0mm · 0.81mm/px · 6 of 17 slices shown (1 of 2)]
[im 1/17]
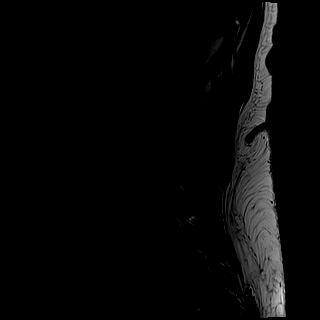
[im 4/17]
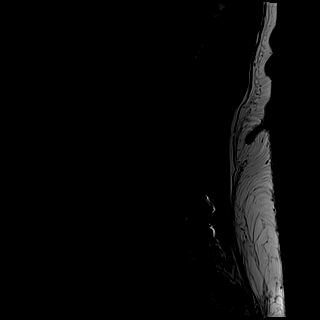
[im 7/17]
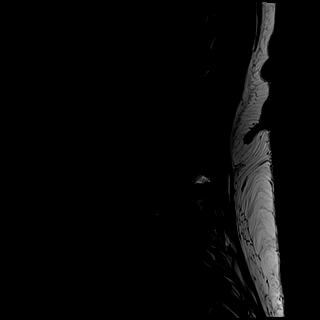
[im 10/17]
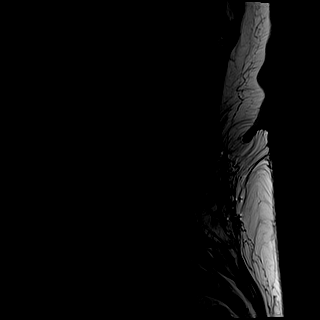
[im 13/17]
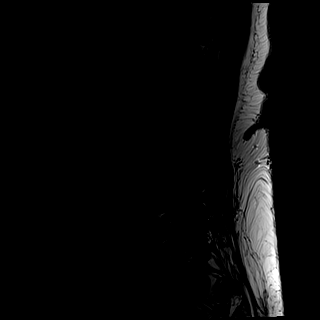
[im 17/17]
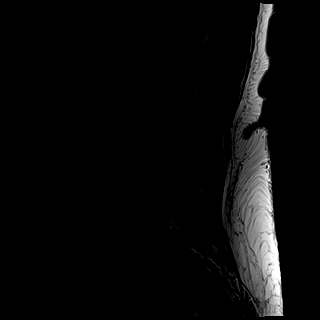

[Series 4: STIR · sagittal · 4.0mm · 1.02mm/px · 4 of 17 slices shown]
[im 1/17]
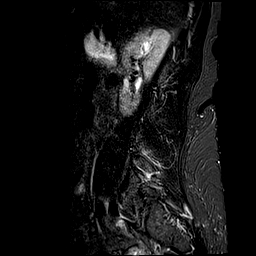
[im 4/17]
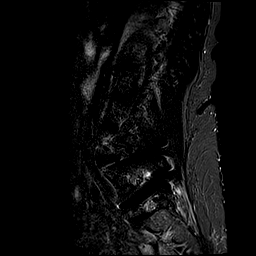
[im 7/17]
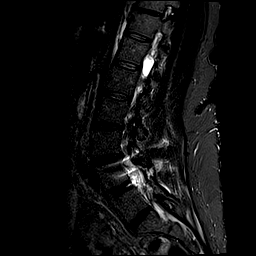
[im 10/17]
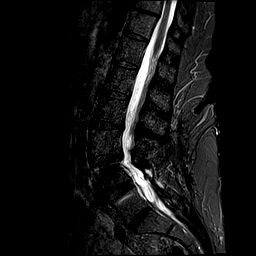

[Series 5: T2 · axial · 4.0mm · 0.78mm/px · z∈[-70,+171]mm · 9 of 41 slices shown (2 of 2)]
[im 1/41]
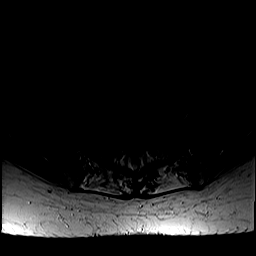
[im 6/41]
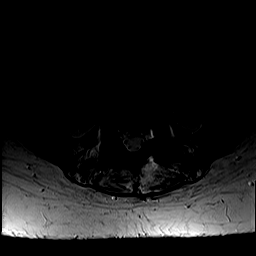
[im 12/41]
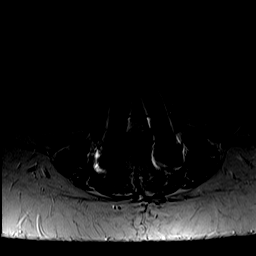
[im 18/41]
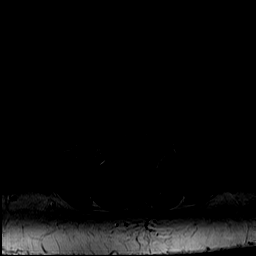
[im 21/41]
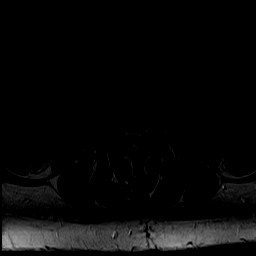
[im 23/41]
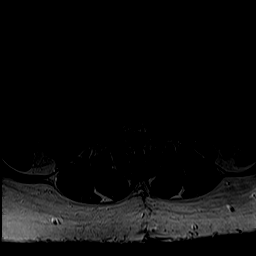
[im 29/41]
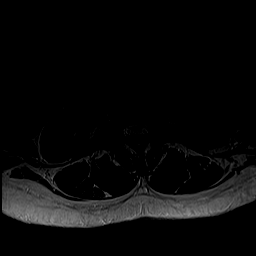
[im 35/41]
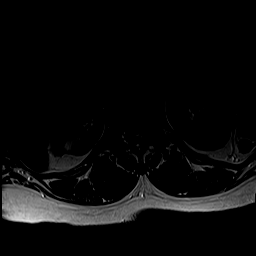
[im 41/41]
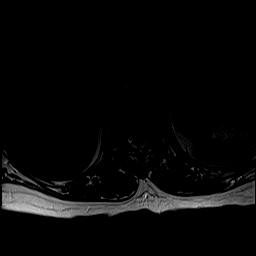

[Series 6: T1 · axial · 4.0mm · 0.39mm/px · z∈[-70,+171]mm · 9 of 41 slices shown (2 of 2)]
[im 1/41]
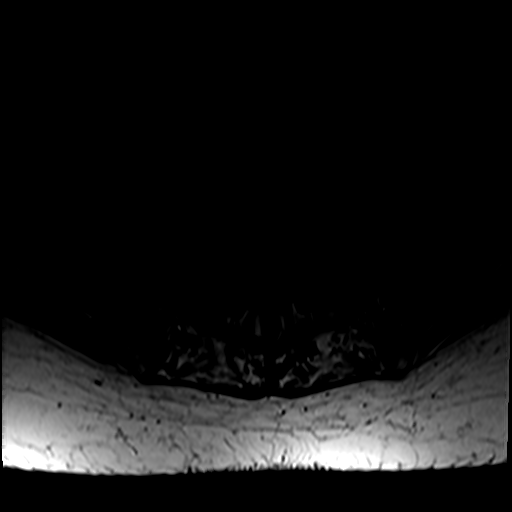
[im 6/41]
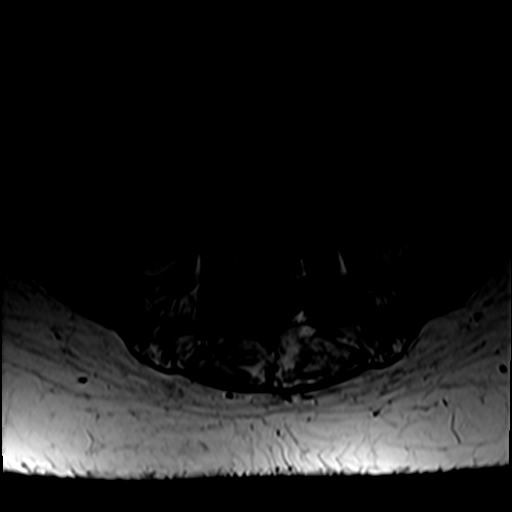
[im 12/41]
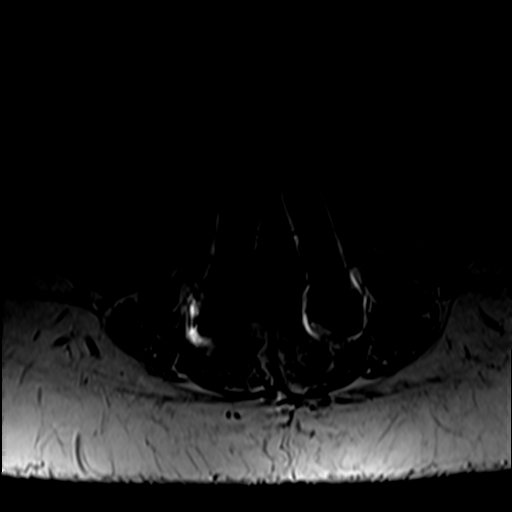
[im 18/41]
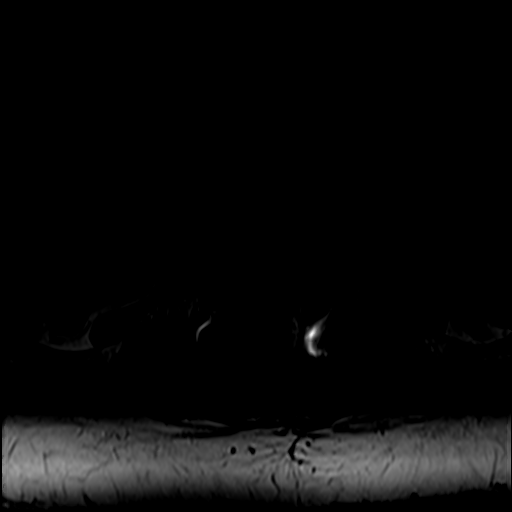
[im 21/41]
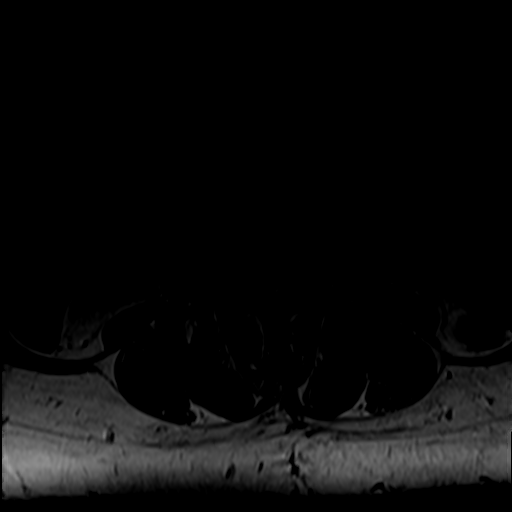
[im 23/41]
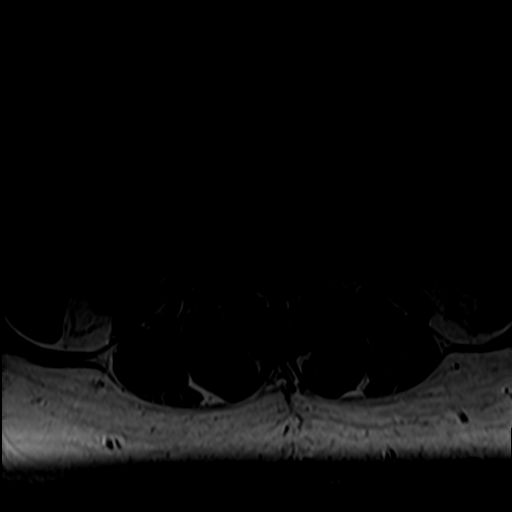
[im 29/41]
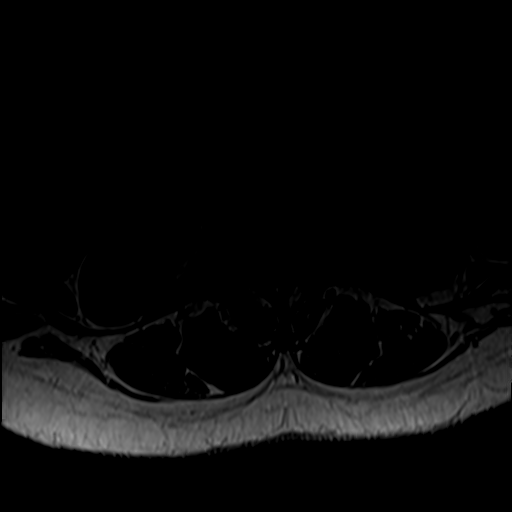
[im 35/41]
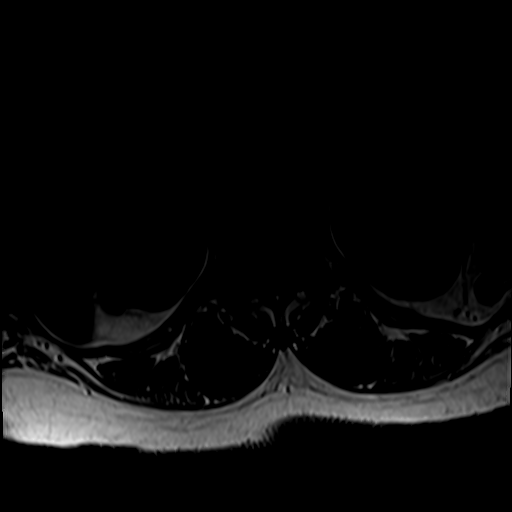
[im 41/41]
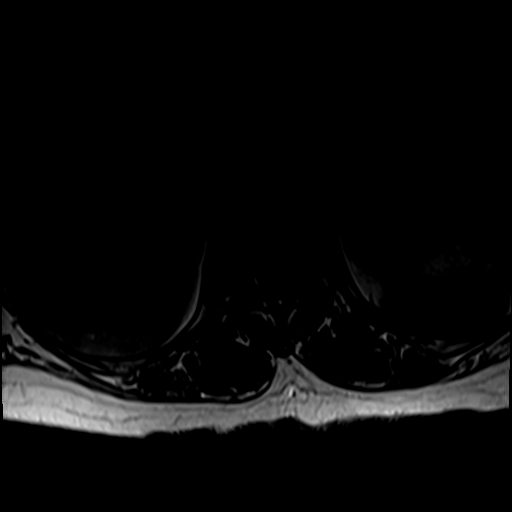

[34 of 48 positions shown; findings below may reference images not displayed]

FINDINGS: Same numbering system as on the 3540 MRI, designating previous
posterior and interbody fusion at L4-L5. Solid-appearing arthrodesis
as seen on the 5795 CT. Mild hardware susceptibility artifact.
Stable vertebral height and alignment since 3540. No marrow edema or
evidence of acute osseous abnormality.

Visualized lower thoracic spinal cord is normal with conus medularis
at T12.

Stable visualized abdominal viscera. Stable postoperative changes to
the lower lumbar paraspinal soft tissues.

T10-T11: Disc desiccation, disc space loss, and mild disc bulge.
Mild facet and ligament flavum hypertrophy. No stenosis.

T11-T12:  Negative.

T12-L1: Negative aside from chronic right perineural cyst (series 4,
image 6 and series 5, image 9) which is unchanged and of doubtful
significance.

L1-L2:  Negative disc.  Mild facet hypertrophy.  No stenosis.

L2-L3: Chronic disc desiccation and posterior disc space loss with
broad-based posterior disc extrusion, now slightly more eccentric to
the right (series 5, image 22). Stable mild facet and ligament
flavum hypertrophy. Mildly increased mass effect on the ventral
thecal sac, but no significant spinal or lateral recess stenosis.
Borderline to mild L2 foraminal stenosis appears slightly increased.

L3-L4: Chronic mild disc desiccation and circumferential disc bulge.
Broad-based posterior component of disc appears stable. Moderate
facet and ligament flavum hypertrophy is stable to mildly increased.
Borderline to mild spinal stenosis has not significantly changed.
Borderline to mild L3 foraminal stenosis appears stable.

L4-L5:  Sequelae of decompression and fusion, unchanged.

L5-S1: Small central annular fissure of the disc is stable to
slightly regressed (series 2, image 9). Stable mild facet
hypertrophy. Otherwise negative, no stenosis.
IMPRESSION: 1. Chronic decompression and fusion at L4-L5 with solid arthrodesis.
2. Adjacent segment disease at L3-L4 and L5-S1 has not significantly
changed. Borderline to mild spinal and foraminal stenosis at the
former.
3. Mild progression of disc disease at L2-L3, with increased mass
effect on the thecal sac but no significant spinal or lateral recess
stenosis. Mild bilateral L2 foraminal stenosis does appear slightly
increased.
4. Stable and probably inconsequential right T12 perineural cyst.

## 2018-03-05 ENCOUNTER — Ambulatory Visit: Payer: Medicare HMO | Admitting: Podiatry

## 2018-03-05 DIAGNOSIS — H16223 Keratoconjunctivitis sicca, not specified as Sjogren's, bilateral: Secondary | ICD-10-CM | POA: Diagnosis not present

## 2018-03-13 ENCOUNTER — Other Ambulatory Visit: Payer: Self-pay | Admitting: Family Medicine

## 2018-03-13 NOTE — Telephone Encounter (Signed)
Needs refill on the medication that she took for constipation.  She cant remember the name but was takiing it last year and had stopped but she wants to start back on it.  She states she is very constipated.  Her call back is [phone]  She uses [facility]

## 2018-03-13 NOTE — Telephone Encounter (Signed)
From ov note on 12/24/16 it looks like she may be talking about Linzess. Please advise?

## 2018-03-14 ENCOUNTER — Other Ambulatory Visit: Payer: Self-pay | Admitting: Family Medicine

## 2018-03-17 ENCOUNTER — Other Ambulatory Visit: Payer: Self-pay

## 2018-03-17 MED ORDER — LINACLOTIDE 290 MCG PO CAPS: 290.0000 ug | ORAL_CAPSULE | Freq: Every day | ORAL | 3 refills | Status: DC

## 2018-03-17 NOTE — Telephone Encounter (Signed)
Yes--248mcg daily--1 year rf. thx

## 2018-03-18 ENCOUNTER — Encounter (HOSPITAL_COMMUNITY): Payer: Self-pay | Admitting: Internal Medicine

## 2018-03-18 ENCOUNTER — Observation Stay (HOSPITAL_COMMUNITY)
Admission: EM | Admit: 2018-03-18 | Discharge: 2018-03-21 | Disposition: A | Discharge status: Home / Self Care | Payer: Medicare HMO | Attending: Internal Medicine | Admitting: Internal Medicine

## 2018-03-18 ENCOUNTER — Emergency Department (HOSPITAL_COMMUNITY): Payer: Medicare HMO

## 2018-03-18 DIAGNOSIS — R55 Syncope and collapse: Principal | ICD-10-CM | POA: Diagnosis present

## 2018-03-18 DIAGNOSIS — Z8249 Family history of ischemic heart disease and other diseases of the circulatory system: Secondary | ICD-10-CM | POA: Insufficient documentation

## 2018-03-18 DIAGNOSIS — E669 Obesity, unspecified: Secondary | ICD-10-CM | POA: Insufficient documentation

## 2018-03-18 DIAGNOSIS — Z7984 Long term (current) use of oral hypoglycemic drugs: Secondary | ICD-10-CM | POA: Insufficient documentation

## 2018-03-18 DIAGNOSIS — Z882 Allergy status to sulfonamides status: Secondary | ICD-10-CM | POA: Diagnosis not present

## 2018-03-18 DIAGNOSIS — E119 Type 2 diabetes mellitus without complications: Secondary | ICD-10-CM | POA: Diagnosis not present

## 2018-03-18 DIAGNOSIS — I10 Essential (primary) hypertension: Secondary | ICD-10-CM | POA: Diagnosis present

## 2018-03-18 DIAGNOSIS — W19XXXA Unspecified fall, initial encounter: Secondary | ICD-10-CM | POA: Insufficient documentation

## 2018-03-18 DIAGNOSIS — Z79899 Other long term (current) drug therapy: Secondary | ICD-10-CM | POA: Insufficient documentation

## 2018-03-18 DIAGNOSIS — Z9884 Bariatric surgery status: Secondary | ICD-10-CM

## 2018-03-18 DIAGNOSIS — R4020 Unspecified coma: Secondary | ICD-10-CM | POA: Diagnosis not present

## 2018-03-18 DIAGNOSIS — G40201 Localization-related (focal) (partial) symptomatic epilepsy and epileptic syndromes with complex partial seizures, not intractable, with status epilepticus: Secondary | ICD-10-CM | POA: Diagnosis present

## 2018-03-18 DIAGNOSIS — M1990 Unspecified osteoarthritis, unspecified site: Secondary | ICD-10-CM | POA: Diagnosis not present

## 2018-03-18 DIAGNOSIS — G40901 Epilepsy, unspecified, not intractable, with status epilepticus: Secondary | ICD-10-CM | POA: Insufficient documentation

## 2018-03-18 DIAGNOSIS — R531 Weakness: Secondary | ICD-10-CM | POA: Diagnosis not present

## 2018-03-18 DIAGNOSIS — D649 Anemia, unspecified: Secondary | ICD-10-CM | POA: Diagnosis present

## 2018-03-18 DIAGNOSIS — E118 Type 2 diabetes mellitus with unspecified complications: Secondary | ICD-10-CM

## 2018-03-18 DIAGNOSIS — I639 Cerebral infarction, unspecified: Secondary | ICD-10-CM | POA: Insufficient documentation

## 2018-03-18 DIAGNOSIS — M25551 Pain in right hip: Secondary | ICD-10-CM | POA: Diagnosis not present

## 2018-03-18 DIAGNOSIS — R569 Unspecified convulsions: Secondary | ICD-10-CM | POA: Diagnosis not present

## 2018-03-18 DIAGNOSIS — Z91041 Radiographic dye allergy status: Secondary | ICD-10-CM | POA: Diagnosis not present

## 2018-03-18 LAB — CBC WITH DIFFERENTIAL/PLATELET
Basophils Absolute: 0 10*3/uL (ref 0.0–0.1)
Basophils Relative: 0 %
Eosinophils Absolute: 0.1 10*3/uL (ref 0.0–0.7)
Eosinophils Relative: 1 %
HCT: 26.8 % — ABNORMAL LOW (ref 36.0–46.0)
Hemoglobin: 8.3 g/dL — ABNORMAL LOW (ref 12.0–15.0)
Lymphocytes Relative: 9 %
Lymphs Abs: 1 10*3/uL (ref 0.7–4.0)
MCH: 25.8 pg — ABNORMAL LOW (ref 26.0–34.0)
MCHC: 31 g/dL (ref 30.0–36.0)
MCV: 83.2 fL (ref 78.0–100.0)
Monocytes Absolute: 0.6 10*3/uL (ref 0.1–1.0)
Monocytes Relative: 6 %
Neutro Abs: 9.3 10*3/uL — ABNORMAL HIGH (ref 1.7–7.7)
Neutrophils Relative %: 84 %
Platelets: 441 10*3/uL — ABNORMAL HIGH (ref 150–400)
RBC: 3.22 MIL/uL — ABNORMAL LOW (ref 3.87–5.11)
RDW: 15.4 % (ref 11.5–15.5)
WBC: 11 10*3/uL — ABNORMAL HIGH (ref 4.0–10.5)

## 2018-03-18 LAB — BASIC METABOLIC PANEL
Anion gap: 6 (ref 5–15)
BUN: 27 mg/dL — ABNORMAL HIGH (ref 6–20)
CO2: 27 mmol/L (ref 22–32)
Calcium: 8.7 mg/dL — ABNORMAL LOW (ref 8.9–10.3)
Chloride: 108 mmol/L (ref 101–111)
Creatinine, Ser: 1.42 mg/dL — ABNORMAL HIGH (ref 0.44–1.00)
GFR calc Af Amer: 44 mL/min — ABNORMAL LOW (ref >60)
GFR calc non Af Amer: 38 mL/min — ABNORMAL LOW (ref >60)
Glucose, Bld: 95 mg/dL (ref 65–99)
Potassium: 4 mmol/L (ref 3.5–5.1)
Sodium: 141 mmol/L (ref 135–145)

## 2018-03-18 LAB — RAPID URINE DRUG SCREEN, HOSP PERFORMED
Amphetamines: NOT DETECTED
Barbiturates: NOT DETECTED
Benzodiazepines: NOT DETECTED
Cocaine: NOT DETECTED
Opiates: NOT DETECTED
Tetrahydrocannabinol: NOT DETECTED

## 2018-03-18 LAB — I-STAT TROPONIN, ED: Troponin i, poc: 0.01 ng/mL (ref 0.00–0.08)

## 2018-03-18 LAB — CBG MONITORING, ED: Glucose-Capillary: 80 mg/dL (ref 65–99)

## 2018-03-18 MED ORDER — ENOXAPARIN SODIUM 40 MG/0.4ML ~~LOC~~ SOLN
40.0000 mg | Freq: Every day | SUBCUTANEOUS | Status: DC
  Administered 2018-03-19: 40 mg via SUBCUTANEOUS
  Filled 2018-03-18: qty 0.4

## 2018-03-18 MED ORDER — FENTANYL CITRATE (PF) 100 MCG/2ML IJ SOLN
50.0000 ug | Freq: Once | INTRAMUSCULAR | Status: AC
  Administered 2018-03-18: 50 ug via INTRAVENOUS
  Filled 2018-03-18: qty 2

## 2018-03-18 MED ORDER — MELOXICAM 15 MG PO TABS
15.0000 mg | ORAL_TABLET | Freq: Every day | ORAL | Status: DC
Start: 2018-03-19 — End: 2018-03-21
  Administered 2018-03-19 – 2018-03-21 (×3): 15 mg via ORAL
  Filled 2018-03-18 (×3): qty 1

## 2018-03-18 MED ORDER — SODIUM CHLORIDE 0.9% FLUSH
3.0000 mL | Freq: Two times a day (BID) | INTRAVENOUS | Status: DC
  Administered 2018-03-19 – 2018-03-21 (×5): 3 mL via INTRAVENOUS

## 2018-03-18 MED ORDER — LINACLOTIDE 145 MCG PO CAPS
290.0000 ug | ORAL_CAPSULE | Freq: Every day | ORAL | Status: DC
  Administered 2018-03-19 – 2018-03-21 (×3): 290 ug via ORAL
  Filled 2018-03-18 (×3): qty 2

## 2018-03-18 MED ORDER — GABAPENTIN 100 MG PO CAPS
200.0000 mg | ORAL_CAPSULE | Freq: Three times a day (TID) | ORAL | Status: DC
  Administered 2018-03-19 – 2018-03-21 (×8): 200 mg via ORAL
  Filled 2018-03-18 (×8): qty 2

## 2018-03-18 MED ORDER — ONDANSETRON 4 MG PO TBDP
4.0000 mg | ORAL_TABLET | Freq: Three times a day (TID) | ORAL | Status: DC | PRN
  Administered 2018-03-21: 4 mg via ORAL
  Filled 2018-03-18: qty 1

## 2018-03-18 MED ORDER — METOPROLOL SUCCINATE ER 25 MG PO TB24
25.0000 mg | ORAL_TABLET | Freq: Every day | ORAL | Status: DC
  Administered 2018-03-20 – 2018-03-21 (×2): 25 mg via ORAL
  Filled 2018-03-18 (×2): qty 1

## 2018-03-18 MED ORDER — SODIUM CHLORIDE 0.9 % IV BOLUS
500.0000 mL | Freq: Once | INTRAVENOUS | Status: AC
  Administered 2018-03-18: 500 mL via INTRAVENOUS

## 2018-03-18 MED ORDER — METFORMIN HCL 500 MG PO TABS
500.0000 mg | ORAL_TABLET | Freq: Every day | ORAL | Status: DC
  Administered 2018-03-19 – 2018-03-21 (×3): 500 mg via ORAL
  Filled 2018-03-18 (×3): qty 1

## 2018-03-18 MED ORDER — MELATONIN 5 MG PO TABS
10.0000 mg | ORAL_TABLET | Freq: Every evening | ORAL | Status: DC | PRN
  Filled 2018-03-18: qty 2

## 2018-03-18 MED ORDER — TRAZODONE HCL 50 MG PO TABS
50.0000 mg | ORAL_TABLET | Freq: Every evening | ORAL | Status: DC | PRN
  Administered 2018-03-19 – 2018-03-20 (×3): 50 mg via ORAL
  Filled 2018-03-18 (×3): qty 1

## 2018-03-18 NOTE — ED Notes (Signed)
Seizure pads placed and seizure precautions initiated. Patient is in a room that is visible from the nurse's station. Will continue to monitor.

## 2018-03-18 NOTE — ED Notes (Signed)
Bed: IT25 Expected date:  Expected time:  Means of arrival:  Comments: 67 yo f seizure

## 2018-03-18 NOTE — H&P (Signed)
History and Physical    Julie Acevedo GGE:[id number] DOB: 1951/03/11 DOA: 03/18/2018  PCP: Jerrol Banana., MD  Patient coming from: Home  I have personally briefly reviewed patient's old medical records in [place]  Chief Complaint: Syncope  HPI: Julie Acevedo is a 67 y.o. female with medical history significant of DM2, HTN.  Patient presents to the ED after a syncopal episode at home.  Family states that  patient had gone up to the kitchen and reported that granddaughter heard her fall.  Granddaughter went to find patient in the kitchen, patient was unresponsive, prompting EMS call.  When EMS arrived, they state the patient was confused and was acting postictally; however, family reports that they did not see any tonic-clonic seizure activity, patient did not have any tongue laceration, urinary or bowel incontinence.  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  Patient also reports she has been having some midsternal chest pain that is been ongoing for the last several weeks.  Neg Stress test Nov 2016.  Benign 48 hour Holter Nov 2017.  Looks like cardiologist recd echo Nov 2017 for mild Murmur, dont see where this was actually done though. H/o thyroid nodules, TSH nl in Feb.  ED Course: HGB 8.3, down from 9.3 in Nov 2018.  Creat 1.4 (1.2).   Review of Systems: As per HPI otherwise 10 point review of systems negative.   Past Medical History:  Diagnosis Date  . Arthritis   . Diverticulitis   . DM (diabetes mellitus) ([place])    typ e 2  . HTN (hypertension)   . Memory difficulties 09/01/2015  . Short-term memory loss     Past Surgical History:  Procedure Laterality Date  . ABDOMINAL HYSTERECTOMY     due to endometriosis-1 ovary left  . BREAST EXCISIONAL BIOPSY Right 1971   neg  . CARDIAC CATHETERIZATION  2000   no significant CAD per note of Dr Caryl Comes in 2013   . CHOLECYSTECTOMY    . CYSTOCELE  REPAIR N/A 03/19/2017   Procedure: ANTERIOR REPAIR (CYSTOCELE);  Surgeon: Bjorn Loser, MD;  [facility];  Service: Urology;  Laterality: N/A;  . CYSTOSCOPY N/A 03/19/2017   Procedure: CYSTOSCOPY;  Surgeon: Bjorn Loser, MD;  [facility];  Service: Urology;  Laterality: N/A;  . EYE SURGERY    . FOOT SURGERY    . HAND SURGERY    . HEMICOLECTOMY    . INSERTION OF MESH N/A 08/21/2016   Procedure: INSERTION OF MESH;  Surgeon: Excell Seltzer, MD;  [facility];  Service: General;  Laterality: N/A;  . LAPAROSCOPIC LYSIS OF ADHESIONS N/A 08/21/2016   Procedure: LAPAROSCOPIC LYSIS OF ADHESIONS;  Surgeon: Excell Seltzer, MD;  [facility];  Service: General;  Laterality: N/A;  . LAPAROSCOPIC REMOVAL ABDOMINAL MASS    . LUMBAR DISC SURGERY    . ROUX-EN-Y GASTRIC BYPASS  2010   Dr Hassell Done  . TONSILLECTOMY    . UPPER GI ENDOSCOPY  01/12/16   normal larynx, normal esophagus. gastric bypass with a normal-sized pouch and intact staple line, normal examined jejunum, otherwise exam was normal  . VENTRAL HERNIA REPAIR N/A 08/21/2016   Procedure: LAPAROSCOPIC VENTRAL HERNIA;  Surgeon: Excell Seltzer, MD;  [facility];  Service: General;  Laterality: N/A;     reports that she has never smoked. She has never used smokeless tobacco. She reports that she does not drink alcohol  or use drugs.  Allergies  Allergen Reactions  . Lotrel [Amlodipine Besy-Benazepril Hcl] Anaphylaxis  . Contrast Media [Iodinated Diagnostic Agents] Swelling and Other (See Comments)    Reaction:  Neck swelling   . Niacin Hives  . Sulfa Antibiotics Hives    Family History  Problem Relation Age of Onset  . Cancer Father 20       Lung  . Coronary artery disease Father 73  . COPD Mother   . Diabetes Mother   . Hypertension Mother   . Cancer Paternal Grandmother        Breast  . Breast cancer Paternal Grandmother   . Congestive Heart Failure Maternal Grandmother   . Emphysema Maternal Grandfather     . Heart attack Paternal Grandfather   . Cancer Paternal Aunt        Breast  . Breast cancer Paternal Aunt 44  . Breast cancer Paternal Aunt 64     Prior to Admission medications   Medication Sig Start Date End Date Taking? Authorizing Provider  gabapentin (NEURONTIN) 100 MG capsule Take 2 capsules (200 mg total) by mouth 3 (three) times daily. 11/05/17  Yes Jerrol Banana., MD  hydrochlorothiazide (HYDRODIURIL) 25 MG tablet TAKE 1 TABLET (25 MG TOTAL) BY MOUTH DAILY. 12/02/17  Yes Jerrol Banana., MD  linaclotide [facility]) 290 MCG CAPS capsule Take 1 capsule (290 mcg total) by mouth daily before breakfast. 03/17/18  Yes Jerrol Banana., MD  Melatonin 10 MG TABS Take 10 mg by mouth at bedtime as needed (for sleep.).    Yes [provider]  meloxicam (MOBIC) 15 MG tablet Take 15 mg by mouth daily.  08/13/17  Yes [provider]  metFORMIN (GLUCOPHAGE) 500 MG tablet Take 1 tablet (500 mg total) by mouth daily with breakfast. 11/18/17  Yes Jerrol Banana., MD  metoprolol succinate (TOPROL-XL) 25 MG 24 hr tablet Take 25 mg by mouth daily.   Yes [provider]  ondansetron (ZOFRAN ODT) 4 MG disintegrating tablet Take 1 tablet (4 mg total) every 8 (eight) hours as needed by mouth for nausea or vomiting. 10/30/17  Yes Carmon Ginsberg, PA  traZODone (DESYREL) 50 MG tablet Take 1 tablet (50 mg total) by mouth at bedtime as needed for sleep. 09/12/17  Yes Jerrol Banana., MD    Physical Exam: Vitals:   03/18/18 1612 03/18/18 1623 03/18/18 1918 03/18/18 2217  BP:  (!) 151/74 105/62 122/70  Pulse:  (!) 57 (!) 56 (!) 54  Resp:  12 16 17   Temp:  97.6 F (36.4 C)    TempSrc:  Oral    SpO2: 100% 100% 100% 100%    Constitutional: NAD, calm, comfortable Eyes: PERRL, lids and conjunctivae normal ENMT: Mucous membranes are moist. Posterior pharynx clear of any exudate or lesions.Normal dentition.  Neck: normal, supple, no masses, no  thyromegaly Respiratory: clear to auscultation bilaterally, no wheezing, no crackles. Normal respiratory effort. No accessory muscle use.  Cardiovascular: Regular rate and rhythm, no murmurs / rubs / gallops. No extremity edema. 2+ pedal pulses. No carotid bruits.  Abdomen: no tenderness, no masses palpated. No hepatosplenomegaly. Bowel sounds positive.  Musculoskeletal: no clubbing / cyanosis. No joint deformity upper and lower extremities. Good ROM, no contractures. Normal muscle tone.  Skin: no rashes, lesions, ulcers. No induration Neurologic: CN 2-12 grossly intact. Sensation intact, DTR normal. Strength 5/5 in all 4.  Psychiatric: Normal judgment and insight. Alert and oriented x 3. Normal mood.  Labs on Admission: I have personally reviewed following labs and imaging studies  CBC: Recent Labs  Lab 03/18/18 1816  WBC 11.0*  NEUTROABS 9.3*  HGB 8.3*  HCT 26.8*  MCV 83.2  PLT 811*   Basic Metabolic Panel: Recent Labs  Lab 03/18/18 1816  NA 141  K 4.0  CL 108  CO2 27  GLUCOSE 95  BUN 27*  CREATININE 1.42*  CALCIUM 8.7*   GFR: CrCl cannot be calculated (Unknown ideal weight.). Liver Function Tests: No results for input(s): AST, ALT, ALKPHOS, BILITOT, PROT, ALBUMIN in the last 168 hours. No results for input(s): LIPASE, AMYLASE in the last 168 hours. No results for input(s): AMMONIA in the last 168 hours. Coagulation Profile: No results for input(s): INR, PROTIME in the last 168 hours. Cardiac Enzymes: No results for input(s): CKTOTAL, CKMB, CKMBINDEX, TROPONINI in the last 168 hours. BNP (last 3 results) No results for input(s): PROBNP in the last 8760 hours. HbA1C: No results for input(s): HGBA1C in the last 72 hours. CBG: Recent Labs  Lab 03/18/18 1647  GLUCAP 80   Lipid Profile: No results for input(s): CHOL, HDL, LDLCALC, TRIG, CHOLHDL, LDLDIRECT in the last 72 hours. Thyroid Function Tests: No results for input(s): TSH, T4TOTAL, FREET4, T3FREE,  THYROIDAB in the last 72 hours. Anemia Panel: No results for input(s): VITAMINB12, FOLATE, FERRITIN, TIBC, IRON, RETICCTPCT in the last 72 hours. Urine analysis:    Component Value Date/Time   COLORURINE YELLOW (A) 06/10/2017 1735   APPEARANCEUR CLEAR (A) 06/10/2017 1735   APPEARANCEUR Clear 06/28/2015 1055   LABSPEC 1.019 06/10/2017 1735   LABSPEC 1.016 02/17/2014 2031   PHURINE 7.0 06/10/2017 1735   GLUCOSEU NEGATIVE 06/10/2017 1735   GLUCOSEU Negative 02/17/2014 2031   HGBUR NEGATIVE 06/10/2017 1735   BILIRUBINUR NEGATIVE 06/10/2017 1735   BILIRUBINUR Negative 06/28/2015 1055   BILIRUBINUR Negative 02/17/2014 2031   KETONESUR NEGATIVE 06/10/2017 1735   PROTEINUR NEGATIVE 06/10/2017 1735   UROBILINOGEN 0.2 11/15/2012 1507   NITRITE NEGATIVE 06/10/2017 1735   LEUKOCYTESUR TRACE (A) 06/10/2017 1735   LEUKOCYTESUR Negative 06/28/2015 1055   LEUKOCYTESUR Negative 02/17/2014 2031    Radiological Exams on Admission: Dg Chest 2 View  Result Date: 03/18/2018 CLINICAL DATA:  Found unresponsive in kitchen by her husband this afternoon, history of seizures EXAM: CHEST - 2 VIEW COMPARISON:  09/28/2015 FINDINGS: Upper normal heart size. Mediastinal contours and pulmonary vascularity normal. Lungs clear. No pleural effusion or pneumothorax. Bones demineralized. IMPRESSION: No acute abnormalities. Electronically Signed   By: Lavonia Dana M.D.   On: 03/18/2018 17:24   Ct Head Wo Contrast  Result Date: 03/18/2018 CLINICAL DATA:  Patient found unresponsive at home today. EXAM: CT HEAD WITHOUT CONTRAST TECHNIQUE: Contiguous axial images were obtained from the base of the skull through the vertex without intravenous contrast. COMPARISON:  Brain MRI 09/27/2015.  Head CT scan 07/21/2015. FINDINGS: Brain: No evidence of acute infarction, hemorrhage, hydrocephalus, extra-axial collection or mass lesion/mass effect. Vascular: No hyperdense vessel or unexpected calcification. Skull: Intact. Sinuses/Orbits: No  acute abnormality. The patient is status post bilateral lens extraction. Other: None. IMPRESSION: Negative head CT. Electronically Signed   By: Inge Rise M.D.   On: 03/18/2018 17:32   Dg Hip Unilat W Or Wo Pelvis 2-3 Views Right  Result Date: 03/18/2018 CLINICAL DATA:  Found unresponsive in kitchen by her husband this afternoon, history of seizures, RIGHT hip pain EXAM: DG HIP (WITH OR WITHOUT PELVIS) 2-3V RIGHT COMPARISON:  None FINDINGS: Numerous pelvic phleboliths. Prior  lower lumbar fusion L4-L5. Bones appear slightly demineralized. Hip and SI joint spaces preserved. No acute fracture, dislocation, or bone destruction. IMPRESSION: No acute osseous abnormalities. Electronically Signed   By: Lavonia Dana M.D.   On: 03/18/2018 17:23    EKG: Independently reviewed.  Assessment/Plan Principal Problem:   Syncope Active Problems:   Gastric bypass status for obesity   Benign essential HTN   Anemia    1. Syncope - 1. Syncope pathway 2. Tele monitor 3. 2d echo 4. Serial trops 2. Anemia - 1. Anemia PNL 2. Repeat CBC in AM 3. HTN - 1. Continue metoprolol 2. Hold HCTZ 4. DM2  1. Continue metformin  DVT prophylaxis: Lovenox Code Status: Full Family Communication: Family at bedside Disposition Plan: Home after admit Consults called: None Admission status: Place in [place], [place] [facility] Pager [phone]  If 7AM-7PM, please contact day team taking care of patient [email or web address] Password [facility]  03/18/2018, 11:35 PM

## 2018-03-18 NOTE — ED Triage Notes (Signed)
Pt arrived to [facility] via [facility] from home after being found unresponsive in her kitchen by her husband this afternoon. Pt has hx of seizures and was in post ictal state when EMS arrived at the scene. Vitals stable for EMS and this RN. Patient is complaining of right hip pain and states that she thinks she might have fallen before

## 2018-03-18 NOTE — ED Provider Notes (Signed)
[facility] DEPT Provider Note   CSN: [id number] Arrival date & time: 03/18/18  1601     History   Chief Complaint Chief Complaint  Patient presents with  . Loss of Consciousness    HPI Julie Acevedo is a 67 y.o. female personal history of diverticulitis, DM, hypertension who presents for evaluation of altered mental status.  EMS had initially reported the patient was postictal on arrival.  Family at bedside states that patient had gone up to the kitchen and reported that granddaughter heard her fall.  Granddaughter went to find patient in the kitchen, patient was unresponsive, prompting EMS call.  When EMS arrived, they state the patient was confused and was acting postictally.  Family reports that they did not have any tonic-clonic seizure activity patient did not have any tongue laceration, urinary or bowel incontinence.  On ED arrival, patient is complaining of some right hip pain.  She reports she has not been able to ambulate or bear weight on the sling since the incident.  She does not recall what happened.  Patient also reports she has been having some midsternal chest pain that is been ongoing for the last several weeks.  She does not recall if the pain is worse with deep inspiration or exertion.  She states she has not had any associated nausea/vomiting or any diaphoresis.  Family denies any recent fevers, abdominal pain, difficulty breathing, nausea/vomiting.  The history is provided by the patient.    Past Medical History:  Diagnosis Date  . Arthritis   . Diverticulitis   . DM (diabetes mellitus) ([place])    typ e 2  . HTN (hypertension)   . Memory difficulties 09/01/2015  . Short-term memory loss     Patient Active Problem List   Diagnosis Date Noted  . Syncope 03/18/2018  . Anemia 03/18/2018  . Exophthalmos 02/22/2018  . Multiple thyroid nodules 02/22/2018  . 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 Pain at surgical site 08/30/2016  .  Ventral incisional hernia 08/21/2016  . SBO (small bowel obstruction) ([place])   . Partial small bowel obstruction ([place]) 04/23/2016  . Adjustment disorder with mixed anxiety and depressed mood 04/23/2016  . Memory difficulties 09/01/2015  . Arthritis 06/11/2015  . Back pain, chronic 06/11/2015  . HLD (hyperlipidemia) 06/11/2015  . Cannot sleep 06/11/2015  . L-S radiculopathy 06/11/2015  . Arthritis of knee, degenerative 06/11/2015  . B12 deficiency 06/11/2015  . 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 DM2 (diabetes mellitus, type 2) ([place]) 11/15/2012  . HTN (hypertension) 01/27/2012  . Adiposity 11/21/2009  . Avitaminosis D 11/07/2009  . Narrowing of intervertebral disc space 07/25/2009  . Benign essential HTN 07/25/2009    Past Surgical History:  Procedure Laterality Date  . ABDOMINAL HYSTERECTOMY     due to endometriosis-1 ovary left  . BREAST EXCISIONAL BIOPSY Right 1971   neg  . CARDIAC CATHETERIZATION  2000   no significant CAD per note of Dr Caryl Comes in 2013   . CHOLECYSTECTOMY    . CYSTOCELE REPAIR N/A 03/19/2017   Procedure: ANTERIOR REPAIR (CYSTOCELE);  Surgeon: Bjorn Loser, MD;  Location: [facility];  Service: Urology;  Laterality: N/A;  . CYSTOSCOPY N/A 03/19/2017   Procedure: CYSTOSCOPY;  Surgeon: Bjorn Loser, MD;  Location: [facility];  Service: Urology;  Laterality: N/A;  . EYE SURGERY    . FOOT SURGERY    . HAND SURGERY    . HEMICOLECTOMY    .  INSERTION OF MESH N/A 08/21/2016   Procedure: INSERTION OF MESH;  Surgeon: Excell Seltzer, MD;  Location: [facility];  Service: General;  Laterality: N/A;  . LAPAROSCOPIC LYSIS OF ADHESIONS N/A 08/21/2016   Procedure: LAPAROSCOPIC LYSIS OF ADHESIONS;  Surgeon: Excell Seltzer, MD;  Location: [facility];  Service: General;  Laterality: N/A;  . LAPAROSCOPIC REMOVAL ABDOMINAL MASS    . LUMBAR DISC SURGERY    . ROUX-EN-Y GASTRIC BYPASS  2010   Dr Hassell Done  . TONSILLECTOMY      . UPPER GI ENDOSCOPY  01/12/16   normal larynx, normal esophagus. gastric bypass with a normal-sized pouch and intact staple line, normal examined jejunum, otherwise exam was normal  . VENTRAL HERNIA REPAIR N/A 08/21/2016   Procedure: LAPAROSCOPIC VENTRAL HERNIA;  Surgeon: Excell Seltzer, MD;  Location: [facility];  Service: General;  Laterality: N/A;     OB History    Gravida  4   Para  3   Term      Preterm      AB      Living        SAB      TAB      Ectopic      Multiple      Live Births           Obstetric Comments  1st Menstrual Cycle:  12 1st Pregnancy:  21          Home Medications    Prior to Admission medications   Medication Sig Start Date End Date Taking? Authorizing Provider  gabapentin (NEURONTIN) 100 MG capsule Take 2 capsules (200 mg total) by mouth 3 (three) times daily. 11/05/17  Yes Jerrol Banana., MD  hydrochlorothiazide (HYDRODIURIL) 25 MG tablet TAKE 1 TABLET (25 MG TOTAL) BY MOUTH DAILY. 12/02/17  Yes Jerrol Banana., MD  linaclotide [facility]) 290 MCG CAPS capsule Take 1 capsule (290 mcg total) by mouth daily before breakfast. 03/17/18  Yes Jerrol Banana., MD  Melatonin 10 MG TABS Take 10 mg by mouth at bedtime as needed (for sleep.).    Yes [provider]  meloxicam (MOBIC) 15 MG tablet Take 15 mg by mouth daily.  08/13/17  Yes [provider]  metFORMIN (GLUCOPHAGE) 500 MG tablet Take 1 tablet (500 mg total) by mouth daily with breakfast. 11/18/17  Yes Jerrol Banana., MD  metoprolol succinate (TOPROL-XL) 25 MG 24 hr tablet Take 25 mg by mouth daily.   Yes [provider]  ondansetron (ZOFRAN ODT) 4 MG disintegrating tablet Take 1 tablet (4 mg total) every 8 (eight) hours as needed by mouth for nausea or vomiting. 10/30/17  Yes Carmon Ginsberg, PA  traZODone (DESYREL) 50 MG tablet Take 1 tablet (50 mg total) by mouth at bedtime as needed for sleep. 09/12/17  Yes Jerrol Banana., MD     Family History Family History  Problem Relation Age of Onset  . Cancer Father 60       Lung  . Coronary artery disease Father 72  . COPD Mother   . Diabetes Mother   . Hypertension Mother   . Cancer Paternal Grandmother        Breast  . Breast cancer Paternal Grandmother   . Congestive Heart Failure Maternal Grandmother   . Emphysema Maternal Grandfather   . Heart attack Paternal Grandfather   . Cancer Paternal Aunt        Breast  . Breast cancer Paternal Aunt  61  . Breast cancer Paternal Aunt 1    Social History Social History   Tobacco Use  . Smoking status: Never Smoker  . Smokeless tobacco: Never Used  Substance Use Topics  . Alcohol use: No  . Drug use: No     Allergies   Lotrel [amlodipine besy-benazepril hcl]; Contrast media [iodinated diagnostic agents]; Niacin; and Sulfa antibiotics   Review of Systems Review of Systems  Constitutional: Negative for chills and fever.  HENT: Negative for congestion.   Eyes: Negative for visual disturbance.  Respiratory: Negative for cough and shortness of breath.   Cardiovascular: Negative for chest pain.  Gastrointestinal: Negative for abdominal pain, diarrhea, nausea and vomiting.  Genitourinary: Negative for dysuria and hematuria.  Musculoskeletal: Negative for back pain and neck pain.       Right hip pain  Skin: Negative for rash.  Neurological: Positive for syncope and weakness (Generalized). Negative for dizziness, numbness and headaches.  All other systems reviewed and are negative.    Physical Exam Updated Vital Signs BP 113/71   Pulse (!) 51   Temp 97.6 F (36.4 C) (Oral)   Resp 15   SpO2 98%   Physical Exam  Constitutional: She is oriented to person, place, and time. She appears well-developed and well-nourished.  HENT:  Head: Normocephalic and atraumatic.  Mouth/Throat: Oropharynx is clear and moist and mucous membranes are normal.  Eyes: Pupils are equal, round, and reactive to light.  Conjunctivae, EOM and lids are normal.  Neck: Full passive range of motion without pain.  Full flexion/extension and lateral movement of neck fully intact. No bony midline tenderness. No deformities or crepitus.   Cardiovascular: Normal rate, regular rhythm, normal heart sounds and normal pulses. Exam reveals no gallop and no friction rub.  No murmur heard. Pulses:      Radial pulses are 2+ on the right side, and 2+ on the left side.       Dorsalis pedis pulses are 2+ on the right side, and 2+ on the left side.  Pulmonary/Chest: Effort normal and breath sounds normal.  Abdominal: Soft. Normal appearance. There is no tenderness. There is no rigidity and no guarding.  Musculoskeletal: Normal range of motion.       Right hip: She exhibits tenderness.       Thoracic back: She exhibits no tenderness.       Lumbar back: She exhibits no tenderness.  Tenderness palpation overlying the right hip.  No deformity or crepitus noted.  Flexion/extension intact but does have some pain with internal and external rotation.  No tenderness palpation to the distal femur.  Bilateral lower extremities are symmetric in appearance.  No evidence of rotation, shortening.  Neurological: She is alert and oriented to person, place, and time.  Cranial nerves III-XII intact Follows commands, Moves all extremities  5/5 strength to BUE and BLE  Sensation intact throughout all major nerve distributions Normal finger to nose. No dysdiadochokinesia. No pronator drift. No gait abnormalities  No slurred speech. No facial droop.   Skin: Skin is warm and dry. Capillary refill takes less than 2 seconds.  Psychiatric: She has a normal mood and affect. Her speech is normal.  Nursing note and vitals reviewed.    ED Treatments / Results  Labs (all labs ordered are listed, but only abnormal results are displayed) Labs Reviewed  BASIC METABOLIC PANEL - Abnormal; Notable for the following components:      Result Value   BUN 27  (*)  Creatinine, Ser 1.42 (*)    Calcium 8.7 (*)    GFR calc non Af Amer 38 (*)    GFR calc Af Amer 44 (*)    All other components within normal limits  CBC WITH DIFFERENTIAL/PLATELET - Abnormal; Notable for the following components:   WBC 11.0 (*)    RBC 3.22 (*)    Hemoglobin 8.3 (*)    HCT 26.8 (*)    MCH 25.8 (*)    Platelets 441 (*)    Neutro Abs 9.3 (*)    All other components within normal limits  RAPID URINE DRUG SCREEN, HOSP PERFORMED  URINALYSIS, ROUTINE W REFLEX MICROSCOPIC  HIV ANTIBODY (ROUTINE TESTING)  TROPONIN I  TROPONIN I  TROPONIN I  VITAMIN B12  FOLATE  IRON AND TIBC  FERRITIN  RETICULOCYTES  CBC  BASIC METABOLIC PANEL  CBG MONITORING, ED  I-STAT TROPONIN, ED    EKG EKG Interpretation  Date/Time:  Tuesday March 18 2018 16:16:03 EDT Ventricular Rate:  60 PR Interval:    QRS Duration: 91 QT Interval:  417 QTC Calculation: 417 R Axis:   81 Text Interpretation:  Sinus rhythm Borderline right axis deviation Minimal ST elevation, inferior leads Baseline wander in lead(s) II Normal sinus rhythm Confirmed by Thomasene Lot, [place] [phone]) on 03/18/2018 10:59:06 PM   Radiology Dg Chest 2 View  Result Date: 03/18/2018 CLINICAL DATA:  Found unresponsive in kitchen by her husband this afternoon, history of seizures EXAM: CHEST - 2 VIEW COMPARISON:  09/28/2015 FINDINGS: Upper normal heart size. Mediastinal contours and pulmonary vascularity normal. Lungs clear. No pleural effusion or pneumothorax. Bones demineralized. IMPRESSION: No acute abnormalities. Electronically Signed   By: Lavonia Dana M.D.   On: 03/18/2018 17:24   Ct Head Wo Contrast  Result Date: 03/18/2018 CLINICAL DATA:  Patient found unresponsive at home today. EXAM: CT HEAD WITHOUT CONTRAST TECHNIQUE: Contiguous axial images were obtained from the base of the skull through the vertex without intravenous contrast. COMPARISON:  Brain MRI 09/27/2015.  Head CT scan 07/21/2015. FINDINGS: Brain: No evidence  of acute infarction, hemorrhage, hydrocephalus, extra-axial collection or mass lesion/mass effect. Vascular: No hyperdense vessel or unexpected calcification. Skull: Intact. Sinuses/Orbits: No acute abnormality. The patient is status post bilateral lens extraction. Other: None. IMPRESSION: Negative head CT. Electronically Signed   By: Inge Rise M.D.   On: 03/18/2018 17:32   Dg Hip Unilat W Or Wo Pelvis 2-3 Views Right  Result Date: 03/18/2018 CLINICAL DATA:  Found unresponsive in kitchen by her husband this afternoon, history of seizures, RIGHT hip pain EXAM: DG HIP (WITH OR WITHOUT PELVIS) 2-3V RIGHT COMPARISON:  None FINDINGS: Numerous pelvic phleboliths. Prior lower lumbar fusion L4-L5. Bones appear slightly demineralized. Hip and SI joint spaces preserved. No acute fracture, dislocation, or bone destruction. IMPRESSION: No acute osseous abnormalities. Electronically Signed   By: Lavonia Dana M.D.   On: 03/18/2018 17:23    Procedures Procedures (including critical care time)  Medications Ordered in ED Medications  sodium chloride flush (NS) 0.9 % injection 3 mL (has no administration in time range)  enoxaparin (LOVENOX) injection 40 mg (has no administration in time range)  gabapentin (NEURONTIN) capsule 200 mg (has no administration in time range)  linaclotide (LINZESS) capsule 290 mcg (has no administration in time range)  Melatonin TABS 10 mg (has no administration in time range)  meloxicam (MOBIC) tablet 15 mg (has no administration in time range)  metFORMIN (GLUCOPHAGE) tablet 500 mg (has no administration in time range)  metoprolol  succinate (TOPROL-XL) 24 hr tablet 25 mg (has no administration in time range)  ondansetron (ZOFRAN-ODT) disintegrating tablet 4 mg (has no administration in time range)  traZODone (DESYREL) tablet 50 mg (has no administration in time range)  fentaNYL (SUBLIMAZE) injection 50 mcg (50 mcg Intravenous Given 03/18/18 1815)  sodium chloride 0.9 % bolus 500  mL (500 mLs Intravenous New Bag/Given 03/18/18 2109)     Initial Impression / Assessment and Plan / ED Course  I have reviewed the triage vital signs and the nursing notes.  Pertinent labs & imaging results that were available during my care of the patient were reviewed by me and considered in my medical decision making (see chart for details).     67 year old female who presents for evaluation of altered mental status.  Initially reported a seizure activity but family at bedside states that patient had what sounds like a syncopal episode.  On ED arrival, patient complains of right hip pain.  Patient  has not been able to bear weight or ambulate since the incident.  Patient does report she has been having chest pain over the last several weeks.  She reports some chest pain currently but states that this is been an ongoing issue.  No associated nausea, vomiting, diaphoresis. Patient is afebrile, non-toxic appearing, sitting comfortably on examination table. Vital signs reviewed and stable.  Consider hip fracture versus sprain versus dislocation.  Consider ACS etiology versus acute infectious etiology or syncopal episode.  Plan to check basic labs, CT head, x-ray of right hip.  Troponin negative.  BMP shows bump in BUN and creatinine.  Slightly more elevated than previous.  CBC shows leukocytosis of 11.0.  Hemoglobin is 8.3 and hematocrit is 26.8. Slightly lower than normal.  CT head negative for any acute abnormality.  Chest x-ray negative for any acute abnormality.  X-ray of hip shows no acute abnormality.  Discussed results with patient.  Patient reports that she has not had any rectal bleeding, melena, bleeding from gums.    Patient was able to bear weight on the right lower extremity without any difficulty.  Attempted to ambulate patient in the ED and she was very unsteady and was tremulous.  Family at bedside states that that is not her baseline.  Additionally, patient felt very generalized  weakness when attempting to ambulate.  Given concerns about single episode, will plan for admission.  Discussed patient with Dr. Alcario Drought (hospitalist). Will admit.   Final Clinical Impressions(s) / ED Diagnoses   Final diagnoses:  Syncope, unspecified syncope type    ED Discharge Orders    None       Desma Mcgregor 03/18/18 2348    Macarthur Critchley, MD 03/20/18 [phone]

## 2018-03-18 NOTE — ED Notes (Signed)
Call patient's daughters with updates:   Julie Acevedo [phone]

## 2018-03-18 NOTE — ED Notes (Signed)
Pt stated that if she couldn't walk to the restroom then just send her home because she wasn't going to use a bedpan or a female urinal

## 2018-03-19 ENCOUNTER — Other Ambulatory Visit: Payer: Self-pay

## 2018-03-19 ENCOUNTER — Observation Stay (HOSPITAL_BASED_OUTPATIENT_CLINIC_OR_DEPARTMENT_OTHER): Payer: Medicare HMO

## 2018-03-19 DIAGNOSIS — Z9884 Bariatric surgery status: Secondary | ICD-10-CM | POA: Diagnosis not present

## 2018-03-19 DIAGNOSIS — R55 Syncope and collapse: Secondary | ICD-10-CM

## 2018-03-19 DIAGNOSIS — I10 Essential (primary) hypertension: Secondary | ICD-10-CM | POA: Diagnosis not present

## 2018-03-19 LAB — BASIC METABOLIC PANEL
Anion gap: 7 (ref 5–15)
BUN: 21 mg/dL — ABNORMAL HIGH (ref 6–20)
CO2: 25 mmol/L (ref 22–32)
Calcium: 8.5 mg/dL — ABNORMAL LOW (ref 8.9–10.3)
Chloride: 106 mmol/L (ref 101–111)
Creatinine, Ser: 1.22 mg/dL — ABNORMAL HIGH (ref 0.44–1.00)
GFR calc Af Amer: 53 mL/min — ABNORMAL LOW (ref >60)
GFR calc non Af Amer: 46 mL/min — ABNORMAL LOW (ref >60)
Glucose, Bld: 87 mg/dL (ref 65–99)
Potassium: 3.7 mmol/L (ref 3.5–5.1)
Sodium: 138 mmol/L (ref 135–145)

## 2018-03-19 LAB — TROPONIN I
Troponin I: <0.03 ng/mL (ref <0.03)
Troponin I: <0.03 ng/mL (ref <0.03)
Troponin I: <0.03 ng/mL (ref <0.03)

## 2018-03-19 LAB — GLUCOSE, CAPILLARY: Glucose-Capillary: 87 mg/dL (ref 65–99)

## 2018-03-19 LAB — RETICULOCYTES
RBC.: 4.59 MIL/uL (ref 3.87–5.11)
Retic Count, Absolute: 41.3 10*3/uL (ref 19.0–186.0)
Retic Ct Pct: 0.9 % (ref 0.4–3.1)

## 2018-03-19 LAB — URINALYSIS, ROUTINE W REFLEX MICROSCOPIC
Bilirubin Urine: NEGATIVE
Glucose, UA: NEGATIVE mg/dL
Hgb urine dipstick: NEGATIVE
Ketones, ur: NEGATIVE mg/dL
Leukocytes, UA: NEGATIVE
Nitrite: NEGATIVE
Protein, ur: NEGATIVE mg/dL
Specific Gravity, Urine: 1.02 (ref 1.005–1.030)
pH: 8 (ref 5.0–8.0)

## 2018-03-19 LAB — CBC
HCT: 25.9 % — ABNORMAL LOW (ref 36.0–46.0)
Hemoglobin: 8.3 g/dL — ABNORMAL LOW (ref 12.0–15.0)
MCH: 26.5 pg (ref 26.0–34.0)
MCHC: 32 g/dL (ref 30.0–36.0)
MCV: 82.7 fL (ref 78.0–100.0)
Platelets: 383 10*3/uL (ref 150–400)
RBC: 3.13 MIL/uL — ABNORMAL LOW (ref 3.87–5.11)
RDW: 15.6 % — ABNORMAL HIGH (ref 11.5–15.5)
WBC: 8.4 10*3/uL (ref 4.0–10.5)

## 2018-03-19 LAB — ECHOCARDIOGRAM COMPLETE
Height: 63 in
Weight: 2284.8 oz

## 2018-03-19 LAB — FOLATE: Folate: 4.9 ng/mL — ABNORMAL LOW (ref >5.9)

## 2018-03-19 LAB — IRON AND TIBC
Iron: 39 ug/dL (ref 28–170)
Saturation Ratios: 13 % (ref 10.4–31.8)
TIBC: 305 ug/dL (ref 250–450)
UIBC: 266 ug/dL

## 2018-03-19 LAB — VITAMIN B12: Vitamin B-12: 92 pg/mL — ABNORMAL LOW (ref 180–914)

## 2018-03-19 LAB — FERRITIN: Ferritin: 10 ng/mL — ABNORMAL LOW (ref 11–307)

## 2018-03-19 LAB — HIV ANTIBODY (ROUTINE TESTING W REFLEX): HIV Screen 4th Generation wRfx: NONREACTIVE

## 2018-03-19 NOTE — Progress Notes (Signed)
  Echocardiogram 2D Echocardiogram has been performed.  Darlina Sicilian M 03/19/2018, 2:57 PM

## 2018-03-19 NOTE — ED Notes (Signed)
ED TO INPATIENT HANDOFF REPORT  Name/Age/Gender Julie Acevedo 67 y.o. female  Code Status    Code Status Orders  (From admission, onward)        Start     Ordered   03/18/18 2322  Full code  Continuous     03/18/18 2323    Code Status History    Date Active Date Inactive Code Status Order ID Comments User Context   08/21/2016 1442 08/26/2016 1404 Full Code [id number]  Excell Seltzer, MD Inpatient   04/23/2016 1754 04/25/2016 1644 Full Code [id number]  Lytle Butte, MD ED   11/15/2012 2315 11/18/2012 2042 Full Code [id number]  Aliene Beams, RN Inpatient   01/27/2012 0714 01/28/2012 2051 Full Code [id number]  Tresa Endo, RN Inpatient      Home/SNF/Other Home  Chief Complaint seizure   Level of Care/Admitting Diagnosis ED Disposition    ED Disposition Condition [facility] Area: [facility] [[id number]]  Level of Care: Telemetry [5]  Admit to tele based on following criteria: Eval of Syncope  Diagnosis: Syncope [206001]  Admitting Physician: Etta Quill [[id number]]  Attending Physician: Etta Quill [[id number]]  PT Class (Do Not Modify): Observation [104]  PT Acc Code (Do Not Modify): Observation [10022]       Medical History Past Medical History:  Diagnosis Date  . Arthritis   . Diverticulitis   . DM (diabetes mellitus) ([place])    typ e 2  . HTN (hypertension)   . Memory difficulties 09/01/2015  . Short-term memory loss     Allergies Allergies  Allergen Reactions  . Lotrel [Amlodipine Besy-Benazepril Hcl] Anaphylaxis  . Contrast Media [Iodinated Diagnostic Agents] Swelling and Other (See Comments)    Reaction:  Neck swelling   . Niacin Hives  . Sulfa Antibiotics Hives    IV Location/Drains/Wounds Patient Lines/Drains/Airways Status   Active Line/Drains/Airways    Name:   Placement date:   Placement time:   Site:   Days:   Peripheral IV 03/18/18 Left Antecubital   03/18/18    1612    Antecubital   1   Incision  (Closed) 08/21/16 Abdomen Other (Comment)   08/21/16    1207     575   Incision (Closed) 03/19/17 Vagina Other (Comment)   03/19/17    1223     365   Incision - 5 Ports Abdomen 1: Umbilicus 2: Left;Lateral;Upper 3: Left;Lateral;Lower 4: Right;Lower;Upper 5: Right;Lateral;Lower   08/21/16    1045     575          Labs/Imaging Results for orders placed or performed during the hospital encounter of 03/18/18 (from the past 48 hour(s))  CBG monitoring, ED     Status: None   Collection Time: 03/18/18  4:47 PM  Result Value Ref Range   Glucose-Capillary 80 65 - 99 mg/dL  Basic metabolic panel     Status: Abnormal   Collection Time: 03/18/18  6:16 PM  Result Value Ref Range   Sodium 141 135 - 145 mmol/L   Potassium 4.0 3.5 - 5.1 mmol/L   Chloride 108 101 - 111 mmol/L   CO2 27 22 - 32 mmol/L   Glucose, Bld 95 65 - 99 mg/dL   BUN 27 (H) 6 - 20 mg/dL   Creatinine, Ser 1.42 (H) 0.44 - 1.00 mg/dL   Calcium 8.7 (L) 8.9 - 10.3 mg/dL   GFR calc non Af Amer 38 (L) >60 mL/min  GFR calc Af Amer 44 (L) >60 mL/min    Comment: (NOTE) The eGFR has been calculated using the CKD EPI equation. This calculation has not been validated in all clinical situations. eGFR's persistently <60 mL/min signify possible Chronic Kidney Disease.    Anion gap 6 5 - 15    Comment: Performed at [facility], [place] [address], [place], [place] [address]  CBC with Differential     Status: Abnormal   Collection Time: 03/18/18  6:16 PM  Result Value Ref Range   WBC 11.0 (H) 4.0 - 10.5 K/uL   RBC 3.22 (L) 3.87 - 5.11 MIL/uL   Hemoglobin 8.3 (L) 12.0 - 15.0 g/dL   HCT 26.8 (L) 36.0 - 46.0 %   MCV 83.2 78.0 - 100.0 fL   MCH 25.8 (L) 26.0 - 34.0 pg   MCHC 31.0 30.0 - 36.0 g/dL   RDW 15.4 11.5 - 15.5 %   Platelets 441 (H) 150 - 400 K/uL   Neutrophils Relative % 84 %   Neutro Abs 9.3 (H) 1.7 - 7.7 K/uL   Lymphocytes Relative 9 %   Lymphs Abs 1.0 0.7 - 4.0 K/uL   Monocytes Relative 6 %   Monocytes  Absolute 0.6 0.1 - 1.0 K/uL   Eosinophils Relative 1 %   Eosinophils Absolute 0.1 0.0 - 0.7 K/uL   Basophils Relative 0 %   Basophils Absolute 0.0 0.0 - 0.1 K/uL    Comment: Performed at [facility], [place] [address] [place], [place] [address]  I-Stat Troponin, ED (not at [facility])     Status: None   Collection Time: 03/18/18  6:27 PM  Result Value Ref Range   Troponin i, poc 0.01 0.00 - 0.08 ng/mL   Comment 3            Comment: Due to the release kinetics of cTnI, a negative result within the first hours of the onset of symptoms does not rule out myocardial infarction with certainty. If myocardial infarction is still suspected, repeat the test at appropriate intervals.   Urine rapid drug screen (hosp performed)     Status: None   Collection Time: 03/18/18  9:57 PM  Result Value Ref Range   Opiates NONE DETECTED NONE DETECTED   Cocaine NONE DETECTED NONE DETECTED   Benzodiazepines NONE DETECTED NONE DETECTED   Amphetamines NONE DETECTED NONE DETECTED   Tetrahydrocannabinol NONE DETECTED NONE DETECTED   Barbiturates NONE DETECTED NONE DETECTED    Comment: (NOTE) DRUG SCREEN FOR MEDICAL PURPOSES ONLY.  IF CONFIRMATION IS NEEDED FOR ANY PURPOSE, NOTIFY LAB WITHIN 5 DAYS. LOWEST DETECTABLE LIMITS FOR URINE DRUG SCREEN Drug Class                     Cutoff (ng/mL) Amphetamine and metabolites    1000 Barbiturate and metabolites    200 Benzodiazepine                 093 Tricyclics and metabolites     300 Opiates and metabolites        300 Cocaine and metabolites        300 THC                            50 Performed at [facility], [place] [address] [place], [place] [address]    Dg Chest 2 View  Result Date: 03/18/2018 CLINICAL DATA:  Found unresponsive in  kitchen by her husband this afternoon, history of seizures EXAM: CHEST - 2 VIEW COMPARISON:  09/28/2015 FINDINGS: Upper normal heart size. Mediastinal contours and pulmonary vascularity  normal. Lungs clear. No pleural effusion or pneumothorax. Bones demineralized. IMPRESSION: No acute abnormalities. Electronically Signed   By: Lavonia Dana M.D.   On: 03/18/2018 17:24   Ct Head Wo Contrast  Result Date: 03/18/2018 CLINICAL DATA:  Patient found unresponsive at home today. EXAM: CT HEAD WITHOUT CONTRAST TECHNIQUE: Contiguous axial images were obtained from the base of the skull through the vertex without intravenous contrast. COMPARISON:  Brain MRI 09/27/2015.  Head CT scan 07/21/2015. FINDINGS: Brain: No evidence of acute infarction, hemorrhage, hydrocephalus, extra-axial collection or mass lesion/mass effect. Vascular: No hyperdense vessel or unexpected calcification. Skull: Intact. Sinuses/Orbits: No acute abnormality. The patient is status post bilateral lens extraction. Other: None. IMPRESSION: Negative head CT. Electronically Signed   By: Inge Rise M.D.   On: 03/18/2018 17:32   Dg Hip Unilat W Or Wo Pelvis 2-3 Views Right  Result Date: 03/18/2018 CLINICAL DATA:  Found unresponsive in kitchen by her husband this afternoon, history of seizures, RIGHT hip pain EXAM: DG HIP (WITH OR WITHOUT PELVIS) 2-3V RIGHT COMPARISON:  None FINDINGS: Numerous pelvic phleboliths. Prior lower lumbar fusion L4-L5. Bones appear slightly demineralized. Hip and SI joint spaces preserved. No acute fracture, dislocation, or bone destruction. IMPRESSION: No acute osseous abnormalities. Electronically Signed   By: Lavonia Dana M.D.   On: 03/18/2018 17:23    Pending Labs Unresulted Labs (From admission, onward)   Start     Ordered   03/19/18 0500  CBC  Tomorrow morning,   R     03/18/18 2327   03/19/18 6333  Basic metabolic panel  Tomorrow morning,   R     03/18/18 2327   03/18/18 2327  Vitamin B12  (Anemia Panel (PNL))  Once,   R     03/18/18 2326   03/18/18 2327  Folate  (Anemia Panel (PNL))  Once,   R     03/18/18 2326   03/18/18 2327  Iron and TIBC  (Anemia Panel (PNL))  Once,   R     03/18/18  2326   03/18/18 2327  Ferritin  (Anemia Panel (PNL))  Once,   R     03/18/18 2326   03/18/18 2327  Reticulocytes  (Anemia Panel (PNL))  Once,   R     03/18/18 2326   03/18/18 2323  Troponin I  Now then every 6 hours,   R     03/18/18 2323   03/18/18 2322  HIV antibody (Routine Testing)  Once,   R     03/18/18 2323   03/18/18 1651  Urinalysis, Routine w reflex microscopic  STAT,   STAT     03/18/18 1653      Vitals/Pain Today's Vitals   03/18/18 2200 03/18/18 2217 03/18/18 2245 03/18/18 2330  BP: 122/70 122/70 112/62 113/71  Pulse: (!) 50 (!) 54 (!) 54 (!) 51  Resp: _0 Temp:      TempSrc:      SpO2: 100% 100% 98% 98%  PainSc:        Isolation Precautions No active isolations  Medications Medications  sodium chloride flush (NS) 0.9 % injection 3 mL (has no administration in time range)  enoxaparin (LOVENOX) injection 40 mg (has no administration in time range)  gabapentin (NEURONTIN) capsule 200 mg (has no administration in time range)  linaclotide (  LINZESS) capsule 290 mcg (has no administration in time range)  Melatonin TABS 10 mg (has no administration in time range)  meloxicam (MOBIC) tablet 15 mg (has no administration in time range)  metFORMIN (GLUCOPHAGE) tablet 500 mg (has no administration in time range)  metoprolol succinate (TOPROL-XL) 24 hr tablet 25 mg (has no administration in time range)  ondansetron (ZOFRAN-ODT) disintegrating tablet 4 mg (has no administration in time range)  traZODone (DESYREL) tablet 50 mg (has no administration in time range)  fentaNYL (SUBLIMAZE) injection 50 mcg (50 mcg Intravenous Given 03/18/18 1815)  sodium chloride 0.9 % bolus 500 mL (0 mLs Intravenous Stopped 03/18/18 2210)

## 2018-03-19 NOTE — Progress Notes (Signed)
Received pt from the ED - c/o rt hip pain ( rubbing hip) c/o pain 7/10. Seizure precautions set ip . Pt does not recall events that caused her to end up in the ER. Orders reviewed. Met pts Dtr who is going home.

## 2018-03-19 NOTE — Progress Notes (Signed)
Patient ID: Julie Acevedo, female   DOB: [birth year], 67 y.o.   MRN: [id number]  PROGRESS NOTE    Julie Acevedo  UXL:[id number] DOB: 24-Apr-1951 DOA: 03/18/2018 PCP: Jerrol Banana., MD   Outpatient Specialists: None   Brief Narrative: 67 year old female with history of diabetes and hypertension presented to the ER with syncopal episodes. Patient has heart previous syncopal episodes in 2016 9 2017 with negative workup. Husband believes she had a seizure disorder. No obvious seizures observed in the hospital.  Assessment & Plan:   Principal Problem:   Syncope Active Problems:   DM2 (diabetes mellitus, type 2) (HCC)   Gastric bypass status for obesity   Benign essential HTN   Anemia   #1 syncope: Patient is slightly orthostatic and could be secondary to hypo-tension. Patient will also have had cardiac causes. She could have had arrhythmias. Currently stable. Blood pressure is stable now. We will complete workup including echocardiogram as well as telemetry monitoring. Patient may need longer period of Holter monitoring as previous one was only 48 hours.  #2 diabetes: Blood sugar is controlled. Continue current treatment  #3 history of gastric bypass surgery: Patient may have deficiency of essential vitamins. We'll continue monitoring.  #4 hypertension: Essential hypertension. Hold blood pressure medications in the setting of low blood pressure.   DVT prophylaxis: Lovenox Code Status:  Full code Family Communication: husband at bedside Disposition Plan: home   Consultants:   none  Procedures: Echo pending  Antimicrobials:  None  Subjective: Patient has no more syncope. She is weak and tired but hypotensive.  Objective: Vitals:   03/19/18 0400 03/19/18 0454 03/19/18 0800 03/19/18 1109  BP:  (!) 95/53 (!) 99/52   Pulse: (!) 47 (!) 58 (!) 55   Resp: 20 17    Temp:  98.2 F (36.8 C)    TempSrc:  Oral    SpO2:  98%    Weight:  64.8 kg (142 lb 12.8 oz)    Height:     5\' 3"  (1.6 m)    Intake/Output Summary (Last 24 hours) at 03/19/2018 1309 Last data filed at 03/19/2018 0100 Gross per 24 hour  Intake 740 ml  Output -  Net 740 ml   Filed Weights   03/19/18 0454  Weight: 64.8 kg (142 lb 12.8 oz)    Examination:  General exam: Appears calm and comfortable  Respiratory system: Clear to auscultation. Respiratory effort normal. Cardiovascular system: S1 & S2 heard, RRR. No JVD, murmurs, rubs, gallops or clicks. No pedal edema. Gastrointestinal system: Abdomen is nondistended, soft and nontender. No organomegaly or masses felt. Normal bowel sounds heard. Central nervous system: Alert and oriented. No focal neurological deficits. Extremities: Symmetric 5 x 5 power. Skin: No rashes, lesions or ulcers Psychiatry: Judgement and insight appear normal. Mood & affect appropriate.     Data Reviewed: I have personally reviewed following labs and imaging studies  CBC: Recent Labs  Lab 03/18/18 1816 03/19/18 0555  WBC 11.0* 8.4  NEUTROABS 9.3*  --   HGB 8.3* 8.3*  HCT 26.8* 25.9*  MCV 83.2 82.7  PLT 441* 536   Basic Metabolic Panel: Recent Labs  Lab 03/18/18 1816 03/19/18 0555  NA 141 138  K 4.0 3.7  CL 108 106  CO2 27 25  GLUCOSE 95 87  BUN 27* 21*  CREATININE 1.42* 1.22*  CALCIUM 8.7* 8.5*   GFR: Estimated Creatinine Clearance: 42.2 mL/min (A) (by C-G formula based on SCr of 1.22 mg/dL (H)). Liver  Function Tests: No results for input(s): AST, ALT, ALKPHOS, BILITOT, PROT, ALBUMIN in the last 168 hours. No results for input(s): LIPASE, AMYLASE in the last 168 hours. No results for input(s): AMMONIA in the last 168 hours. Coagulation Profile: No results for input(s): INR, PROTIME in the last 168 hours. Cardiac Enzymes: Recent Labs  Lab 03/18/18 2352 03/19/18 0555 03/19/18 1133  TROPONINI <0.03 <0.03 <0.03   BNP (last 3 results) No results for input(s): PROBNP in the last 8760 hours. HbA1C: No results for input(s): HGBA1C in the  last 72 hours. CBG: Recent Labs  Lab 03/18/18 1647 03/19/18 0733  GLUCAP 80 87   Lipid Profile: No results for input(s): CHOL, HDL, LDLCALC, TRIG, CHOLHDL, LDLDIRECT in the last 72 hours. Thyroid Function Tests: No results for input(s): TSH, T4TOTAL, FREET4, T3FREE, THYROIDAB in the last 72 hours. Anemia Panel: Recent Labs    03/18/18 2327 03/18/18 2352  VITAMINB12 92*  --   FOLATE 4.9*  --   FERRITIN 10*  --   TIBC 305  --   IRON 39  --   RETICCTPCT  --  0.9   Urine analysis:    Component Value Date/Time   COLORURINE YELLOW 03/18/2018 2157   APPEARANCEUR CLEAR 03/18/2018 2157   APPEARANCEUR Clear 06/28/2015 1055   LABSPEC 1.020 03/18/2018 2157   LABSPEC 1.016 02/17/2014 2031   PHURINE 8.0 03/18/2018 2157   GLUCOSEU NEGATIVE 03/18/2018 2157   GLUCOSEU Negative 02/17/2014 2031   HGBUR NEGATIVE 03/18/2018 2157   BILIRUBINUR NEGATIVE 03/18/2018 2157   BILIRUBINUR Negative 06/28/2015 1055   BILIRUBINUR Negative 02/17/2014 2031   KETONESUR NEGATIVE 03/18/2018 2157   PROTEINUR NEGATIVE 03/18/2018 2157   UROBILINOGEN 0.2 11/15/2012 1507   NITRITE NEGATIVE 03/18/2018 2157   LEUKOCYTESUR NEGATIVE 03/18/2018 2157   LEUKOCYTESUR Negative 06/28/2015 1055   LEUKOCYTESUR Negative 02/17/2014 2031   Sepsis Labs: @LABRCNTIP (procalcitonin:4,lacticidven:4)  )No results found for this or any previous visit (from the past 240 hour(s)).       Radiology Studies: Dg Chest 2 View  Result Date: 03/18/2018 CLINICAL DATA:  Found unresponsive in kitchen by her husband this afternoon, history of seizures EXAM: CHEST - 2 VIEW COMPARISON:  09/28/2015 FINDINGS: Upper normal heart size. Mediastinal contours and pulmonary vascularity normal. Lungs clear. No pleural effusion or pneumothorax. Bones demineralized. IMPRESSION: No acute abnormalities. Electronically Signed   By: Lavonia Dana M.D.   On: 03/18/2018 17:24   Ct Head Wo Contrast  Result Date: 03/18/2018 CLINICAL DATA:  Patient found  unresponsive at home today. EXAM: CT HEAD WITHOUT CONTRAST TECHNIQUE: Contiguous axial images were obtained from the base of the skull through the vertex without intravenous contrast. COMPARISON:  Brain MRI 09/27/2015.  Head CT scan 07/21/2015. FINDINGS: Brain: No evidence of acute infarction, hemorrhage, hydrocephalus, extra-axial collection or mass lesion/mass effect. Vascular: No hyperdense vessel or unexpected calcification. Skull: Intact. Sinuses/Orbits: No acute abnormality. The patient is status post bilateral lens extraction. Other: None. IMPRESSION: Negative head CT. Electronically Signed   By: Inge Rise M.D.   On: 03/18/2018 17:32   Dg Hip Unilat W Or Wo Pelvis 2-3 Views Right  Result Date: 03/18/2018 CLINICAL DATA:  Found unresponsive in kitchen by her husband this afternoon, history of seizures, RIGHT hip pain EXAM: DG HIP (WITH OR WITHOUT PELVIS) 2-3V RIGHT COMPARISON:  None FINDINGS: Numerous pelvic phleboliths. Prior lower lumbar fusion L4-L5. Bones appear slightly demineralized. Hip and SI joint spaces preserved. No acute fracture, dislocation, or bone destruction. IMPRESSION: No acute osseous abnormalities. Electronically  Signed   By: Lavonia Dana M.D.   On: 03/18/2018 17:23        Scheduled Meds: . enoxaparin (LOVENOX) injection  40 mg Subcutaneous QHS  . gabapentin  200 mg Oral TID  . linaclotide  290 mcg Oral QAC breakfast  . meloxicam  15 mg Oral Daily  . metFORMIN  500 mg Oral Q breakfast  . metoprolol succinate  25 mg Oral Daily  . sodium chloride flush  3 mL Intravenous Q12H   Continuous Infusions:   LOS: 0 days    Time spent: 49 minutes    Milon Dethloff,LAWAL, MD [facility] Pager [phone] If 7PM-7AM, please contact night-coverage [email or web address] Password [email or web address] 03/19/2018, 1:09 PM

## 2018-03-20 DIAGNOSIS — R002 Palpitations: Secondary | ICD-10-CM

## 2018-03-20 DIAGNOSIS — E118 Type 2 diabetes mellitus with unspecified complications: Secondary | ICD-10-CM | POA: Diagnosis not present

## 2018-03-20 DIAGNOSIS — Z9884 Bariatric surgery status: Secondary | ICD-10-CM | POA: Diagnosis not present

## 2018-03-20 DIAGNOSIS — R55 Syncope and collapse: Secondary | ICD-10-CM | POA: Diagnosis not present

## 2018-03-20 LAB — GLUCOSE, CAPILLARY: Glucose-Capillary: 77 mg/dL (ref 65–99)

## 2018-03-20 MED ORDER — FOLIC ACID 1 MG PO TABS
1.0000 mg | ORAL_TABLET | Freq: Every day | ORAL | Status: DC
  Administered 2018-03-21: 1 mg via ORAL
  Filled 2018-03-20: qty 1

## 2018-03-20 MED ORDER — SODIUM CHLORIDE 0.9 % IV SOLN
INTRAVENOUS | Status: DC
  Administered 2018-03-20 – 2018-03-21 (×2): via INTRAVENOUS

## 2018-03-20 MED ORDER — ENOXAPARIN SODIUM 40 MG/0.4ML ~~LOC~~ SOLN
40.0000 mg | SUBCUTANEOUS | Status: DC
  Administered 2018-03-20: 40 mg via SUBCUTANEOUS
  Filled 2018-03-20: qty 0.4

## 2018-03-20 NOTE — Consult Note (Addendum)
Cardiology Consultation:   Patient ID: Julie Acevedo; [id number]; 06-27-1951   Admit date: 03/18/2018 Date of Consult: 03/20/2018  Primary Care Provider: Jerrol Banana., MD Primary Cardiologist: Yolonda Kida, MD  Primary Electrophysiologist:  NA   Patient Profile:   Julie Acevedo is a 67 y.o. female with a hx of DM-2, HTN, palpitations and tachycardia, back surgery with chronic back pain and hx of gastric bypass,  who is being seen today for the evaluation of syncope at the request of Dr. Tawanna Solo.   History of Present Illness:   [name] has a hx of DM-2, HTN, palpitations and tachycardia, back surgery with chronic back pain and hx of gastric bypass surgery was admitted 03/18/18 with syncope.  She stood and went into kitchen and granddaughter found her after hearing her fall.  Pt was unresponsive, but did come to and was confused, postictal -no seizures seen by family, no urinary or bowel incontinence.  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    Pt also reported midsternal chest pain that was occurring over last several weeks.   She tells me the chest pain is for some time a dull ache Lt ant chest .  It comes with rest and exertion but does not awaken from sleep.  Sometimes it lasts all day.  Some nausea but she has nausea at times anyway.  No SOB.  She was going to tell her PCP on next visit.  With the syncope her heart was racing prior to episode.   She tells me the toprol originally helped her racing heart rate but now she has more episodes.    Neg stress test 10/2015, echo 10/2006  EF > 55%, normal except mild dilatation of LA.  She wore a 48 hour monitor but minimum rate of 47, max of 119, average of 70. No PVC, rare PACs, benign holter.    In ER with orthostatic BP checks she was orthostatic at 3 min.  She does not eat much and does not believe she drinks much fluid.  Her gastric bypass was in 2010 and she was 280  lbs then.  Now at 133 lbs.    EKG SR at 60 baseline is wavy ? ST mild elevation. I personally reviewed but no acute changes.   Troponins all neg <0.03, folate is low, Na 138, K+ 3.7, Cr 1.42 on arrival now 1.22, Hgb 8.3, WBC was 11 now 8.4   UA clear, CXR clear.  Currently doing well still with some dull lt ant chest ache.    Past Medical History:  Diagnosis Date  . Arthritis   . Diverticulitis   . DM (diabetes mellitus) ([place])    typ e 2  . HTN (hypertension)   . Memory difficulties 09/01/2015  . Short-term memory loss     Past Surgical History:  Procedure Laterality Date  . ABDOMINAL HYSTERECTOMY     due to endometriosis-1 ovary left  . BREAST EXCISIONAL BIOPSY Right 1971   neg  . CARDIAC CATHETERIZATION  2000   no significant CAD per note of Dr Caryl Comes in 2013   . CHOLECYSTECTOMY    . CYSTOCELE REPAIR N/A 03/19/2017   Procedure: ANTERIOR REPAIR (CYSTOCELE);  Surgeon: Bjorn Loser, MD;  Location: [facility];  Service: Urology;  Laterality: N/A;  . CYSTOSCOPY N/A 03/19/2017   Procedure: CYSTOSCOPY;  Surgeon: Bjorn Loser, MD;  Location: [facility];  Service: Urology;  Laterality: N/A;  .  EYE SURGERY    . FOOT SURGERY    . HAND SURGERY    . HEMICOLECTOMY    . INSERTION OF MESH N/A 08/21/2016   Procedure: INSERTION OF MESH;  Surgeon: Excell Seltzer, MD;  Location: [facility];  Service: General;  Laterality: N/A;  . LAPAROSCOPIC LYSIS OF ADHESIONS N/A 08/21/2016   Procedure: LAPAROSCOPIC LYSIS OF ADHESIONS;  Surgeon: Excell Seltzer, MD;  Location: [facility];  Service: General;  Laterality: N/A;  . LAPAROSCOPIC REMOVAL ABDOMINAL MASS    . LUMBAR DISC SURGERY    . ROUX-EN-Y GASTRIC BYPASS  2010   Dr Hassell Done  . TONSILLECTOMY    . UPPER GI ENDOSCOPY  01/12/16   normal larynx, normal esophagus. gastric bypass with a normal-sized pouch and intact staple line, normal examined jejunum, otherwise exam was normal  . VENTRAL HERNIA REPAIR N/A 08/21/2016   Procedure: LAPAROSCOPIC VENTRAL HERNIA;   Surgeon: Excell Seltzer, MD;  Location: [facility];  Service: General;  Laterality: N/A;       Inpatient Medications: Scheduled Meds: . enoxaparin (LOVENOX) injection  40 mg Subcutaneous QHS  . gabapentin  200 mg Oral TID  . linaclotide  290 mcg Oral QAC breakfast  . meloxicam  15 mg Oral Daily  . metFORMIN  500 mg Oral Q breakfast  . metoprolol succinate  25 mg Oral Daily  . sodium chloride flush  3 mL Intravenous Q12H   Continuous Infusions:  PRN Meds: Melatonin, ondansetron, traZODone  Allergies:    Allergies  Allergen Reactions  . Lotrel [Amlodipine Besy-Benazepril Hcl] Anaphylaxis  . Contrast Media [Iodinated Diagnostic Agents] Swelling and Other (See Comments)    Reaction:  Neck swelling   . Niacin Hives  . Sulfa Antibiotics Hives    Social History:   Social History   Socioeconomic History  . Marital status: Married    Spouse name: Not on file  . Number of children: 3  . Years of education: 8  . Highest education level: Some college, no degree  Occupational History  . Occupation: IT consultant: RETIRED  Social Needs  . Financial resource strain: Not hard at all  . Food insecurity:    Worry: Never true    Inability: Never true  . Transportation needs:    Medical: No    Non-medical: No  Tobacco Use  . Smoking status: Never Smoker  . Smokeless tobacco: Never Used  Substance and Sexual Activity  . Alcohol use: No  . Drug use: No  . Sexual activity: Never  Lifestyle  . Physical activity:    Days per week: Not on file    Minutes per session: Not on file  . Stress: To some extent  Relationships  . Social connections:    Talks on phone: Not on file    Gets together: Not on file    Attends religious service: Not on file    Active member of club or organization: Not on file    Attends meetings of clubs or organizations: Not on file    Relationship status: Not on file  . Intimate partner violence:    Fear of current or ex partner: Not on file      Emotionally abused: Not on file    Physically abused: Not on file    Forced sexual activity: Not on file  Other Topics Concern  . Not on file  Social History Narrative   Lives with husband, daughter and granddaughter.;   Patient drinks about 3 cups of  caffeine daily.   Patient is left handed.     Family History:    Family History  Problem Relation Age of Onset  . Cancer Father 23       Lung  . Coronary artery disease Father 34  . COPD Mother   . Diabetes Mother   . Hypertension Mother   . Cancer Paternal Grandmother        Breast  . Breast cancer Paternal Grandmother   . Congestive Heart Failure Maternal Grandmother   . Emphysema Maternal Grandfather   . Heart attack Paternal Grandfather   . Cancer Paternal Aunt        Breast  . Breast cancer Paternal Aunt 53  . Breast cancer Paternal Aunt 68     ROS:  Please see the history of present illness.  General:no colds or fevers, no weight changes Skin:no rashes or ulcers HEENT:no blurred vision, no congestion CV:see HPI PUL:see HPI GI:no diarrhea constipation or melena, no indigestion GU:no hematuria, no dysuria MS:no joint pain, no claudication Neuro:no syncope, no lightheadedness Endo:no diabetes, no thyroid disease  All other ROS reviewed and negative.     Physical Exam/Data:   Vitals:   03/19/18 1955 03/20/18 0014 03/20/18 0423 03/20/18 0425  BP: (!) 110/58 100/67 117/65   Pulse: 63 81 72   Resp:   18   Temp: 98.2 F (36.8 C) 98.2 F (36.8 C) 98.3 F (36.8 C)   TempSrc: Oral Oral Oral   SpO2: 98% 99% 90%   Weight:    133 lb 12.8 oz (60.7 kg)  Height:       No intake or output data in the 24 hours ending 03/20/18 1233 Filed Weights   03/19/18 0454 03/20/18 0425  Weight: 142 lb 12.8 oz (64.8 kg) 133 lb 12.8 oz (60.7 kg)   Body mass index is 23.7 kg/m.  General:  Frail in appearance, but no acute distress HEENT: normal Lymph: no adenopathy Neck: no JVD Endocrine:  No thryomegaly Vascular: No  carotid bruits; 2+ pedal pulses bil. Cardiac:  normal S1, S2; RRR; no murmur, gallup rub or click   Lungs:  clear to auscultation bilaterally, no wheezing, rhonchi or rales  Abd: soft, nontender, no hepatomegaly  Ext: no edema Musculoskeletal:  No deformities, BUE and BLE strength normal and equal Skin: warm and dry  Neuro:  Alert and oriented X 3 MAE, follows commands, no focal abnormalities noted Psych:  Normal affect    Telemetry:  Telemetry was personally reviewed and demonstrates:  SR to SB at 49.    Relevant CV Studies: Echo 03/18/18 Study Conclusions  - Left ventricle: The cavity size was normal. Wall thickness was   normal. Systolic function was normal. The estimated ejection   fraction was in the range of 60% to 65%. Wall motion was normal;   there were no regional wall motion abnormalities. Doppler   parameters are consistent with abnormal left ventricular   relaxation (grade 1 diastolic dysfunction). - Aortic valve: There was no stenosis. - Mitral valve: Mildly calcified annulus. There was no significant   regurgitation. - Right ventricle: The cavity size was normal. Systolic function   was normal. - Tricuspid valve: Peak RV-RA gradient (S): 28 mm Hg. - Pulmonary arteries: PA peak pressure: 31 mm Hg (S). - Inferior vena cava: The vessel was normal in size. The   respirophasic diameter changes were in the normal range (>= 50%),   consistent with normal central venous pressure.  Impressions:  - Normal LV  size with EF 60-65%. Normal RV size and systolic   function. No significant valvular abnormalities.   Laboratory Data:  Chemistry Recent Labs  Lab 03/18/18 1816 03/19/18 0555  NA 141 138  K 4.0 3.7  CL 108 106  CO2 27 25  GLUCOSE 95 87  BUN 27* 21*  CREATININE 1.42* 1.22*  CALCIUM 8.7* 8.5*  GFRNONAA 38* 46*  GFRAA 44* 53*  ANIONGAP 6 7    No results for input(s): PROT, ALBUMIN, AST, ALT, ALKPHOS, BILITOT in the last 168 hours. Hematology Recent  Labs  Lab 03/18/18 1816 03/18/18 2352 03/19/18 0555  WBC 11.0*  --  8.4  RBC 3.22* 4.59 3.13*  HGB 8.3*  --  8.3*  HCT 26.8*  --  25.9*  MCV 83.2  --  82.7  MCH 25.8*  --  26.5  MCHC 31.0  --  32.0  RDW 15.4  --  15.6*  PLT 441*  --  383   Cardiac Enzymes Recent Labs  Lab 03/18/18 2352 03/19/18 0555 03/19/18 1133  TROPONINI <0.03 <0.03 <0.03    Recent Labs  Lab 03/18/18 1827  TROPIPOC 0.01    BNPNo results for input(s): BNP, PROBNP in the last 168 hours.  DDimer No results for input(s): DDIMER in the last 168 hours.  Radiology/Studies:  Dg Chest 2 View  Result Date: 03/18/2018 CLINICAL DATA:  Found unresponsive in kitchen by her husband this afternoon, history of seizures EXAM: CHEST - 2 VIEW COMPARISON:  09/28/2015 FINDINGS: Upper normal heart size. Mediastinal contours and pulmonary vascularity normal. Lungs clear. No pleural effusion or pneumothorax. Bones demineralized. IMPRESSION: No acute abnormalities. Electronically Signed   By: Lavonia Dana M.D.   On: 03/18/2018 17:24   Ct Head Wo Contrast  Result Date: 03/18/2018 CLINICAL DATA:  Patient found unresponsive at home today. EXAM: CT HEAD WITHOUT CONTRAST TECHNIQUE: Contiguous axial images were obtained from the base of the skull through the vertex without intravenous contrast. COMPARISON:  Brain MRI 09/27/2015.  Head CT scan 07/21/2015. FINDINGS: Brain: No evidence of acute infarction, hemorrhage, hydrocephalus, extra-axial collection or mass lesion/mass effect. Vascular: No hyperdense vessel or unexpected calcification. Skull: Intact. Sinuses/Orbits: No acute abnormality. The patient is status post bilateral lens extraction. Other: None. IMPRESSION: Negative head CT. Electronically Signed   By: Inge Rise M.D.   On: 03/18/2018 17:32   Dg Hip Unilat W Or Wo Pelvis 2-3 Views Right  Result Date: 03/18/2018 CLINICAL DATA:  Found unresponsive in kitchen by her husband this afternoon, history of seizures, RIGHT hip pain  EXAM: DG HIP (WITH OR WITHOUT PELVIS) 2-3V RIGHT COMPARISON:  None FINDINGS: Numerous pelvic phleboliths. Prior lower lumbar fusion L4-L5. Bones appear slightly demineralized. Hip and SI joint spaces preserved. No acute fracture, dislocation, or bone destruction. IMPRESSION: No acute osseous abnormalities. Electronically Signed   By: Lavonia Dana M.D.   On: 03/18/2018 17:23    Assessment and Plan:   1. Syncope  Neg CT head -- does have hx of partial seizures.  Echo normal EF with G1 DD, PA pk pressure 31 mmHg --troponins neg.  Would have her follow up with Dr. Clayborn Bigness.  May need linq to rule out arrhthymias. Though pt would prefer if Malachy Moan is needed would prefer to have here.   Dr. Sallyanne Kuster to see.  She was + orthostatic after 3 min.  This may be cause of syncope though she did note rapid HR prior to syncope. May need to stop hctz.  Wear support stockings.  2.  Bradycardia to 49 with BB, but still with rapid beats.  At home.    3.           Chest pain.  Neg MI may be prudent to have exercise myoview to eval for elevated HR and BP.  She is diabetic and FH of CAD.    4.             Weight loss since gastric bypass from 280 to 133 lbs.    For questions or updates, please contact [place] Please consult [email or web address] for contact info under Cardiology/STEMI.   Signed, Cecilie Kicks, NP  03/20/2018 12:33 PM  I have seen and examined the patient along with Cecilie Kicks, NP .  I have reviewed the chart, notes and new data.  I agree with NP's note.  Key new complaints: She is very concerned about her syncopal event but also about very long-standing complaints of palpitations that have remained unexplained.  Reports that she has a first cousin who received a pacemaker around the age of 12. Key examination changes: Normal cardiovascular exam Key new findings / data: No structural heart disease by echo, normal sinus rhythm with vertical axis on ECG, telemetry shows tendency to mild sinus  bradycardia.  PLAN: Many possible examinations for her syncopal event including orthostatic hypotension and bradyarrhythmia.  Episode did not sound compatible with a seizure in view of her very rapid recovery without confusion. I think the highest yield next evaluation would be an implantable loop recorder, which would allow [place] to identify the cause both of her subjective palpitations and of any recurrent episode of syncope. This procedure has been fully reviewed with the patient and written informed consent has been obtained.  We are trying to figure out the logistics of loop recorder implantation, since we do not have the supplies necessary for the procedure at [facility].  At this point, the most straightforward method would be for her to come to the [facility] outpatient area after she is discharged from [facility].  We can probably do this tomorrow morning or early afternoon.   Sanda Klein, MD, [place] [phone] 03/20/2018, 5:18 PM

## 2018-03-20 NOTE — Progress Notes (Addendum)
PROGRESS NOTE    Julie Acevedo  ZSW:[id number] DOB: 1951-07-24 DOA: 03/18/2018 PCP: Jerrol Banana., MD   Brief Narrative: Patient is 67 year old female with past medical history of diabetes, hypertension , chronic back pain, gastric bypass surgery who presented to the emergency department with syncopal episode.She stood up and went to her kitchen and her grand daughter found her falling on the floor .she immediately regained her consciousness.  She felt like something caught her up .No abnormal movement of body noted , no urinary or bowel incontinence .she has history of syncopal episodes in the past with negative workup.  Patient was also found to be orthostatic on presentation.  Assessment & Plan:   Principal Problem:   Syncope Active Problems:   DM2 (diabetes mellitus, type 2) (H[facility])   Gastric bypass status for obesity   Benign essential HTN   Anemia  Syncope: Patient was slightly orthostatic on presentation.  Patient was on Holter monitoring in the past that was kept for 48 hours. Requested for cardiology evaluation today.  Echocardiogram done here today shows normal ejection fraction, no wall motion abnormalities, grade 1 diastolic dysfunction. Orthostatic rechecked today.  They are negative.  But patient feels as if her heart is racing when she stands up. Patient evaluated physical therapy and there is no follow-up needed, no recommendation. Patient was on hydrochlorthiazide at home which is on hold.Would recommend compression stockings on DC.  Chest pain: Patient complains of on and off chest pain at home.  Currently denies any chest pain during my evaluation.  EKG is not history of any ischemia.  Troponins negative. Patient has had negative stress test on 10/2015.    Diabetes: Continue current medication.  Continue to monitor his blood sugars.  Hypertension: On metoprolol.  Hydrochlorothiazide held.  History of partial seizure: On gabapentin.  History of gastric bypass:  Done in 2010.  Has lost around 140 pounds after the surgery.  Anemia: Likely chronic. H and H stable, no need of  blood transfusion at present.  Folic acid found to be low.  Started on supplementation.  AKI: Kidney function were normal a year ago.  Likely this is acute kidney injury.  We will continue gentle  IV fluids    DVT prophylaxis:Lovenox Code Status: Full Family Communication: None present at the bedside Disposition Plan: Home in 1-2 days   Consultants: Cardiology  Procedures: None  Antimicrobials: None  Subjective: Patient seen and examined the bedside this morning.  Remains comfortable.  Denies any chest pain or shortness of breath.  Objective: Vitals:   03/20/18 0014 03/20/18 0423 03/20/18 0425 03/20/18 1419  BP: 100/67 117/65  (!) 108/59  Pulse: 81 72  76  Resp:  18  20  Temp: 98.2 F (36.8 C) 98.3 F (36.8 C)  98.2 F (36.8 C)  TempSrc: Oral Oral  Oral  SpO2: 99% 90%  100%  Weight:   60.7 kg (133 lb 12.8 oz)   Height:       No intake or output data in the 24 hours ending 03/20/18 1454 Filed Weights   03/19/18 0454 03/20/18 0425  Weight: 64.8 kg (142 lb 12.8 oz) 60.7 kg (133 lb 12.8 oz)    Examination:  General exam: Appears calm and comfortable ,Not in distress,average built HEENT:PERRL,Oral mucosa moist, Ear/Nose normal on gross exam Respiratory system: Bilateral equal air entry, normal vesicular breath sounds, no wheezes or crackles  Cardiovascular system: S1 & S2 heard, RRR. No JVD, murmurs, rubs, gallops or clicks.Trace pedal  edema. Gastrointestinal system: Abdomen is nondistended, soft and nontender. No organomegaly or masses felt. Normal bowel sounds heard. Central nervous system: Alert and oriented. No focal neurological deficits. Extremities: Trace edema, no clubbing ,no cyanosis, distal peripheral pulses palpable. Skin: No rashes, lesions or ulcers,no icterus ,no pallor MSK: Normal muscle bulk,tone ,power Psychiatry: Judgement and insight  appear normal. Mood & affect appropriate.     Data Reviewed: I have personally reviewed following labs and imaging studies  CBC: Recent Labs  Lab 03/18/18 1816 03/19/18 0555  WBC 11.0* 8.4  NEUTROABS 9.3*  --   HGB 8.3* 8.3*  HCT 26.8* 25.9*  MCV 83.2 82.7  PLT 441* 627   Basic Metabolic Panel: Recent Labs  Lab 03/18/18 1816 03/19/18 0555  NA 141 138  K 4.0 3.7  CL 108 106  CO2 27 25  GLUCOSE 95 87  BUN 27* 21*  CREATININE 1.42* 1.22*  CALCIUM 8.7* 8.5*   GFR: Estimated Creatinine Clearance: 38.5 mL/min (A) (by C-G formula based on SCr of 1.22 mg/dL (H)). Liver Function Tests: No results for input(s): AST, ALT, ALKPHOS, BILITOT, PROT, ALBUMIN in the last 168 hours. No results for input(s): LIPASE, AMYLASE in the last 168 hours. No results for input(s): AMMONIA in the last 168 hours. Coagulation Profile: No results for input(s): INR, PROTIME in the last 168 hours. Cardiac Enzymes: Recent Labs  Lab 03/18/18 2352 03/19/18 0555 03/19/18 1133  TROPONINI <0.03 <0.03 <0.03   BNP (last 3 results) No results for input(s): PROBNP in the last 8760 hours. HbA1C: No results for input(s): HGBA1C in the last 72 hours. CBG: Recent Labs  Lab 03/18/18 1647 03/19/18 0733 03/20/18 0428  GLUCAP 80 87 77   Lipid Profile: No results for input(s): CHOL, HDL, LDLCALC, TRIG, CHOLHDL, LDLDIRECT in the last 72 hours. Thyroid Function Tests: No results for input(s): TSH, T4TOTAL, FREET4, T3FREE, THYROIDAB in the last 72 hours. Anemia Panel: Recent Labs    03/18/18 2327 03/18/18 2352  VITAMINB12 92*  --   FOLATE 4.9*  --   FERRITIN 10*  --   TIBC 305  --   IRON 39  --   RETICCTPCT  --  0.9   Sepsis Labs: No results for input(s): PROCALCITON, LATICACIDVEN in the last 168 hours.  No results found for this or any previous visit (from the past 240 hour(s)).       Radiology Studies: Dg Chest 2 View  Result Date: 03/18/2018 CLINICAL DATA:  Found unresponsive in  kitchen by her husband this afternoon, history of seizures EXAM: CHEST - 2 VIEW COMPARISON:  09/28/2015 FINDINGS: Upper normal heart size. Mediastinal contours and pulmonary vascularity normal. Lungs clear. No pleural effusion or pneumothorax. Bones demineralized. IMPRESSION: No acute abnormalities. Electronically Signed   By: Lavonia Dana M.D.   On: 03/18/2018 17:24   Ct Head Wo Contrast  Result Date: 03/18/2018 CLINICAL DATA:  Patient found unresponsive at home today. EXAM: CT HEAD WITHOUT CONTRAST TECHNIQUE: Contiguous axial images were obtained from the base of the skull through the vertex without intravenous contrast. COMPARISON:  Brain MRI 09/27/2015.  Head CT scan 07/21/2015. FINDINGS: Brain: No evidence of acute infarction, hemorrhage, hydrocephalus, extra-axial collection or mass lesion/mass effect. Vascular: No hyperdense vessel or unexpected calcification. Skull: Intact. Sinuses/Orbits: No acute abnormality. The patient is status post bilateral lens extraction. Other: None. IMPRESSION: Negative head CT. Electronically Signed   By: Inge Rise M.D.   On: 03/18/2018 17:32   Dg Hip Unilat W Or Wo Pelvis 2-3  Views Right  Result Date: 03/18/2018 CLINICAL DATA:  Found unresponsive in kitchen by her husband this afternoon, history of seizures, RIGHT hip pain EXAM: DG HIP (WITH OR WITHOUT PELVIS) 2-3V RIGHT COMPARISON:  None FINDINGS: Numerous pelvic phleboliths. Prior lower lumbar fusion L4-L5. Bones appear slightly demineralized. Hip and SI joint spaces preserved. No acute fracture, dislocation, or bone destruction. IMPRESSION: No acute osseous abnormalities. Electronically Signed   By: Lavonia Dana M.D.   On: 03/18/2018 17:23        Scheduled Meds: . enoxaparin (LOVENOX) injection  40 mg Subcutaneous Q24H  . gabapentin  200 mg Oral TID  . linaclotide  290 mcg Oral QAC breakfast  . meloxicam  15 mg Oral Daily  . metFORMIN  500 mg Oral Q breakfast  . metoprolol succinate  25 mg Oral Daily    . sodium chloride flush  3 mL Intravenous Q12H   Continuous Infusions:   LOS: 0 days    Time spent: 25 mins.More than 50% of that time was spent in counseling and/or coordination of care.      Shelly Coss, MD [facility] Pager [phone]  If 7PM-7AM, please contact night-coverage [email or web address] Password [email or web address] 03/20/2018, 2:54 PM

## 2018-03-20 NOTE — Evaluation (Addendum)
Physical Therapy Evaluation Patient Details Name: Julie Acevedo MRN: [id number] DOB: 1951-05-27 Today's Date: 03/20/2018   History of Present Illness  67 yo female admitted with syncope. Hx of Sz, dm, htn  Clinical Impression  On eval, pt was supervision level assist for mobility. She walked ~400 feet around unit. Mildly unsteady at times but no overt LOB. Pt c/o feeling as heart was racing when she stood up. BP 112/66 sitting, 112/65 standing, 116/71 after ambulating. Do not anticipate any further PT needs. Will sign off. 1x eval.     Follow Up Recommendations No PT follow up; Intermittent supervision/assist    Equipment Recommendations  None recommended by PT    Recommendations for Other Services       Precautions / Restrictions Precautions Precaution Comments: sz prec Restrictions Weight Bearing Restrictions: No      Mobility  Bed Mobility Overal bed mobility: Independent                Transfers Overall transfer level: Independent               General transfer comment: pt c/o heart racing  Ambulation/Gait Ambulation/Gait assistance: Supervision Ambulation Distance (Feet): 400 Feet Assistive device: None Gait Pattern/deviations: Step-through pattern     General Gait Details: for safety. mild unsteadiness but no overt LOB  Stairs            Wheelchair Mobility    Modified Rankin (Stroke Patients Only)       Balance Overall balance assessment: Mild deficits observed, not formally tested                                           Pertinent Vitals/Pain Pain Assessment: Faces Faces Pain Scale: Hurts little more Pain Location: hip Pain Descriptors / Indicators: Aching;Sore Pain Intervention(s): Monitored during session    Home Living Family/patient expects to be discharged to:: Private residence Living Arrangements: Spouse/significant other   Type of Home: House Home Access: Stairs to enter Entrance Stairs-Rails:  None Entrance Stairs-Number of Steps: 3          Prior Function Level of Independence: Independent               Hand Dominance        Extremity/Trunk Assessment   Upper Extremity Assessment Upper Extremity Assessment: Overall WFL for tasks assessed    Lower Extremity Assessment Lower Extremity Assessment: Overall WFL for tasks assessed    Cervical / Trunk Assessment Cervical / Trunk Assessment: Normal  Communication   Communication: No difficulties  Cognition Arousal/Alertness: Awake/alert Behavior During Therapy: WFL for tasks assessed/performed Overall Cognitive Status: Within Functional Limits for tasks assessed                                        General Comments      Exercises     Assessment/Plan    PT Assessment Patent does not need any further PT services  PT Problem List         PT Treatment Interventions      PT Goals (Current goals can be found in the Care Plan section)  Acute Rehab PT Goals Patient Stated Goal: home PT Goal Formulation: All assessment and education complete, DC therapy    Frequency     Barriers  to discharge        Co-evaluation               AM-PAC PT "6 Clicks" Daily Activity  Outcome Measure Difficulty turning over in bed (including adjusting bedclothes, sheets and blankets)?: None Difficulty moving from lying on back to sitting on the side of the bed? : None Difficulty sitting down on and standing up from a chair with arms (e.g., wheelchair, bedside commode, etc,.)?: None Help needed moving to and from a bed to chair (including a wheelchair)?: None Help needed walking in hospital room?: A Little Help needed climbing 3-5 steps with a railing? : A Little 6 Click Score: 22    End of Session Equipment Utilized During Treatment: Gait belt Activity Tolerance: Patient tolerated treatment well Patient left: in bed;with bed alarm set        Time: 1311-1326 PT Time Calculation (min)  (ACUTE ONLY): 15 min   Charges:   PT Evaluation $PT Eval Moderate Complexity: 1 Mod     PT G Codes:          Weston Anna, MPT Pager: [phone]

## 2018-03-21 ENCOUNTER — Encounter (HOSPITAL_COMMUNITY)
Admission: EM | Disposition: A | Discharge status: Home / Self Care | Payer: Self-pay | Source: Home / Self Care | Attending: Physician Assistant

## 2018-03-21 DIAGNOSIS — E118 Type 2 diabetes mellitus with unspecified complications: Secondary | ICD-10-CM | POA: Diagnosis not present

## 2018-03-21 DIAGNOSIS — R002 Palpitations: Secondary | ICD-10-CM | POA: Diagnosis not present

## 2018-03-21 DIAGNOSIS — R55 Syncope and collapse: Secondary | ICD-10-CM | POA: Diagnosis not present

## 2018-03-21 DIAGNOSIS — Z9884 Bariatric surgery status: Secondary | ICD-10-CM | POA: Diagnosis not present

## 2018-03-21 HISTORY — PX: LOOP RECORDER INSERTION: EP1214

## 2018-03-21 LAB — BASIC METABOLIC PANEL
Anion gap: 6 (ref 5–15)
BUN: 22 mg/dL — ABNORMAL HIGH (ref 6–20)
CO2: 24 mmol/L (ref 22–32)
Calcium: 8.4 mg/dL — ABNORMAL LOW (ref 8.9–10.3)
Chloride: 110 mmol/L (ref 101–111)
Creatinine, Ser: 1.16 mg/dL — ABNORMAL HIGH (ref 0.44–1.00)
GFR calc Af Amer: 56 mL/min — ABNORMAL LOW (ref >60)
GFR calc non Af Amer: 49 mL/min — ABNORMAL LOW (ref >60)
Glucose, Bld: 82 mg/dL (ref 65–99)
Potassium: 3.7 mmol/L (ref 3.5–5.1)
Sodium: 140 mmol/L (ref 135–145)

## 2018-03-21 LAB — GLUCOSE, CAPILLARY: Glucose-Capillary: 85 mg/dL (ref 65–99)

## 2018-03-21 LAB — SURGICAL PCR SCREEN
MRSA, PCR: NEGATIVE
Staphylococcus aureus: NEGATIVE

## 2018-03-21 SURGERY — LOOP RECORDER INSERTION
Anesthesia: LOCAL

## 2018-03-21 MED ORDER — LIDOCAINE-EPINEPHRINE 1 %-1:100000 IJ SOLN
INTRAMUSCULAR | Status: DC | PRN
  Administered 2018-03-21: 20 mL

## 2018-03-21 MED ORDER — FOLIC ACID 1 MG PO TABS: 1.0000 mg | ORAL_TABLET | Freq: Every day | ORAL | 0 refills | Status: DC

## 2018-03-21 MED ORDER — SODIUM CHLORIDE 0.9 % IV SOLN: INTRAVENOUS | Status: DC

## 2018-03-21 SURGICAL SUPPLY — 2 items
LOOP REVEAL LINQSYS (Prosthesis & Implant Heart) ×2 IMPLANT
PACK LOOP INSERTION (CUSTOM PROCEDURE TRAY) ×2 IMPLANT

## 2018-03-21 NOTE — Progress Notes (Addendum)
Progress Note  Patient Name: Julie Acevedo Date of Encounter: 03/21/2018  Primary Cardiologist: Yolonda Kida, MD   Subjective   No chest pain or SOB, no further syncope  Inpatient Medications    Scheduled Meds: . enoxaparin (LOVENOX) injection  40 mg Subcutaneous Q24H  . folic acid  1 mg Oral Daily  . gabapentin  200 mg Oral TID  . linaclotide  290 mcg Oral QAC breakfast  . meloxicam  15 mg Oral Daily  . metFORMIN  500 mg Oral Q breakfast  . metoprolol succinate  25 mg Oral Daily  . sodium chloride flush  3 mL Intravenous Q12H   Continuous Infusions:  PRN Meds: Melatonin, ondansetron, traZODone   Vital Signs    Vitals:   03/20/18 1419 03/20/18 2110 03/21/18 0131 03/21/18 0445  BP: (!) 108/59 111/65 120/63 (!) 106/59  Pulse: 76 70 65 60  Resp: 20 19 18 16   Temp: 98.2 F (36.8 C) 98 F (36.7 C) 98.2 F (36.8 C) 98.5 F (36.9 C)  TempSrc: Oral Oral Oral Oral  SpO2: 100% 100%  98%  Weight:    139 lb 8 oz (63.3 kg)  Height:        Intake/Output Summary (Last 24 hours) at 03/21/2018 1033 Last data filed at 03/21/2018 0904 Gross per 24 hour  Intake 120 ml  Output -  Net 120 ml   Filed Weights   03/19/18 0454 03/20/18 0425 03/21/18 0445  Weight: 142 lb 12.8 oz (64.8 kg) 133 lb 12.8 oz (60.7 kg) 139 lb 8 oz (63.3 kg)    Telemetry    SR to SB at 49 - Personally Reviewed  ECG    No new - Personally Reviewed  Physical Exam   GEN: No acute distress.   Neck: No JVD Cardiac: RRR, no murmurs, rubs, or gallops.  Respiratory: Clear to auscultation bilaterally. GI: Soft, nontender, non-distended  MS: No edema; No deformity. Neuro:  Nonfocal  Psych: Normal affect   Labs    Chemistry Recent Labs  Lab 03/18/18 1816 03/19/18 0555 03/21/18 0527  NA 141 138 140  K 4.0 3.7 3.7  CL 108 106 110  CO2 27 25 24   GLUCOSE 95 87 82  BUN 27* 21* 22*  CREATININE 1.42* 1.22* 1.16*  CALCIUM 8.7* 8.5* 8.4*  GFRNONAA 38* 46* 49*  GFRAA 44* 53* 56*  ANIONGAP 6  7 6      Hematology Recent Labs  Lab 03/18/18 1816 03/18/18 2352 03/19/18 0555  WBC 11.0*  --  8.4  RBC 3.22* 4.59 3.13*  HGB 8.3*  --  8.3*  HCT 26.8*  --  25.9*  MCV 83.2  --  82.7  MCH 25.8*  --  26.5  MCHC 31.0  --  32.0  RDW 15.4  --  15.6*  PLT 441*  --  383    Cardiac Enzymes Recent Labs  Lab 03/18/18 2352 03/19/18 0555 03/19/18 1133  TROPONINI <0.03 <0.03 <0.03    Recent Labs  Lab 03/18/18 1827  TROPIPOC 0.01     BNPNo results for input(s): BNP, PROBNP in the last 168 hours.   DDimer No results for input(s): DDIMER in the last 168 hours.   Radiology    No results found.  Cardiac Studies   Echo 03/19/18 Study Conclusions  - Left ventricle: The cavity size was normal. Wall thickness was   normal. Systolic function was normal. The estimated ejection   fraction was in the range of 60% to 65%.  Wall motion was normal;   there were no regional wall motion abnormalities. Doppler   parameters are consistent with abnormal left ventricular   relaxation (grade 1 diastolic dysfunction). - Aortic valve: There was no stenosis. - Mitral valve: Mildly calcified annulus. There was no significant   regurgitation. - Right ventricle: The cavity size was normal. Systolic function   was normal. - Tricuspid valve: Peak RV-RA gradient (S): 28 mm Hg. - Pulmonary arteries: PA peak pressure: 31 mm Hg (S). - Inferior vena cava: The vessel was normal in size. The   respirophasic diameter changes were in the normal range (>= 50%),   consistent with normal central venous pressure.  Impressions:  - Normal LV size with EF 60-65%. Normal RV size and systolic   function. No significant valvular abnormalities.   Patient Profile     67 y.o. female with a hx of DM-2, HTN, palpitations and tachycardia, back surgery with chronic back pain and hx of gastric bypass admitted with syncope.  Assessment & Plan    Syncope may be due to orthostatic hypotension, but she does have  rapid HR, Dr. Jerilynn Mages. [place] Doughman discussed loop recorder yesterday and plan will be to prepare for discharge , transfer to [facility] by care link and then post loop d/c from [facility].     For questions or updates, please contact [place] Please consult [email or web address] for contact info under Cardiology/STEMI.      Signed, Cecilie Kicks, NP  03/21/2018, 10:33 AM    I have seen and examined the patient along with Cecilie Kicks, NP .  I have reviewed the chart, notes and new data.  I agree with PA/NP's note.  Key new complaints: no new events Key examination changes: normal CV exam Key new findings / data: no significant events on telemetry  PLAN: Implantable loop recorder. This procedure has been fully reviewed with the patient and written informed consent has been obtained.   Sanda Klein, MD, [place] [phone] 03/21/2018, 12:50 PM

## 2018-03-21 NOTE — Progress Notes (Addendum)
Discharge instruction given to pt and daughter and they verbalized understanding of instructions per MD order.   Left chest dressing dry and intact

## 2018-03-21 NOTE — Op Note (Signed)
LOOP RECORDER IMPLANT   Procedure report  Procedure performed:  Loop recorder implantation   Reason for procedure:  1. Recurrent syncope/near-syncope 2. Cryptogenic stroke Procedure performed by:  Sanda Klein, MD  Complications:  None  Estimated blood loss:  <5 mL  Medications administered during procedure:  Lidocaine 1% with 1/10,000 epinephrine 10 mL locally Device details:  Medtronic Reveal Linq model number [id number], serial number [id number] Procedure details:  After the risks and benefits of the procedure were discussed the patient provided informed consent. The patient was prepped and draped in usual sterile fashion. Local anesthesia was administered to an area 2 cm to the left of the sternum in the 4th intercostal space. A cutaneous incision was made using the incision tool. The introducer was then used to create a subcutaneous tunnel and carefully deploy the device. Local pressure was held to ensure hemostasis.  The incision was closed with SteriStrips and a sterile dressing was applied.  R waves 0.75 mV.  Sanda Klein, MD, [place] [phone] office [phone] pager 03/21/2018 3:51 PM

## 2018-03-21 NOTE — Care Management Obs Status (Signed)
[place] NOTIFICATION   Patient Details  Name: CHANLER SCHREITER MRN: [id number] Date of Birth: 1951/05/22   Medicare Observation Status Notification Given:  Yes    Lynnell Catalan, RN 03/21/2018, 1:54 PM

## 2018-03-21 NOTE — Discharge Summary (Signed)
Physician Discharge Summary  Julie Acevedo:[id number] DOB: Apr 18, 1951 DOA: 03/18/2018  PCP: Jerrol Banana., MD  Admit date: 03/18/2018 Discharge date: 03/21/2018  Admitted From: Home Disposition: Home  Discharge Condition:Stable CODE STATUS:Full Diet recommendation: Heart Healthy   Brief/Interim Summary: Patient is 67 year old female with past medical history of diabetes, hypertension , chronic back pain, gastric bypass surgery who presented to the emergency department with syncopal episode.She stood up and went to her kitchen and her grand daughter found her falling on the floor .she immediately regained her consciousness.  She felt like something caught her up .No abnormal movement of body noted , no urinary or bowel incontinence .she has history of syncopal episodes in the past with negative workup.  Patient was also found to be orthostatic on presentation. Patient was started on gentle fluid IV fluids after admission.  Her orthostatic vitals significantly improved during her hospitalization.  Patient also was complaining of palpitation when she stands up from the lying position or sitting position.  Echocardiogram test showed grade 1 diastolic dysfunction otherwise normal ejection fraction, no wall motion abnormalities. She was evaluated by cardiology and she has been planned for placement of loop recorder today.  Following problems were addressed during her hospitalization:  Syncope: Patient was slightly orthostatic on presentation.Started on IV fluids with improvement.  Patient was seen by cardiology in the past for  syncope and underwent Holter monitoring  that was kept for 48 hours. Requested for cardiology evaluation here.  Echocardiogram done here today shows normal ejection fraction, no wall motion abnormalities, grade 1 diastolic dysfunction. Orthostatic rechecked .  They are negative.  But patient feels as if her heart is racing when she stands up. Patient evaluated physical  therapy and there is no follow-up needed, no recommendation. Patient was on hydrochlorthiazide at home which will be held.  Chest pain: Patient complains of on and off chest pain at home.  Currently denies any chest pain during my evaluation.  EKG is not history of any ischemia.  Troponins negative. Patient has had negative stress test on 10/2015.    Diabetes: Continue current medication.  Hypertension: On metoprolol.  Hydrochlorothiazide held.  History of partial seizure: On gabapentin.  History of gastric bypass: Done in 2010.  Has lost around 140 pounds after the surgery.  Anemia: Likely chronic. H and H stable, no need of  blood transfusion at present.  Folic acid found to be low.  Started on supplementation.  AKI: Kidney function were normal a year ago.  Likely acute kidney injury on presentation  .Improved with  gentle  IV fluids.   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 2) Follow up with your PCP in a week. 3) Take your medications as instructed.We have recommended to stop taking hydrochlorothiazide.   Increase activity slowly   Complete by:  As directed      Allergies as of 03/21/2018      Reactions   Lotrel [amlodipine Besy-benazepril Hcl] Anaphylaxis   Contrast Media [iodinated Diagnostic Agents]  Swelling, Other (See Comments)   Reaction:  Neck swelling    Niacin Hives   Sulfa Antibiotics Hives      Medication List    STOP taking these medications   hydrochlorothiazide 25 MG tablet Commonly known as:  HYDRODIURIL     TAKE these  medications   folic acid 1 MG tablet Commonly known as:  FOLVITE Take 1 tablet (1 mg total) by mouth daily.   gabapentin 100 MG capsule Commonly known as:  NEURONTIN Take 2 capsules (200 mg total) by mouth 3 (three) times daily.   linaclotide 290 MCG Caps capsule Commonly known as:  LINZESS Take 1 capsule (290 mcg total) by mouth daily before breakfast.   Melatonin 10 MG Tabs Take 10 mg by mouth at bedtime as needed (for sleep.).   meloxicam 15 MG tablet Commonly known as:  MOBIC Take 15 mg by mouth daily.   metFORMIN 500 MG tablet Commonly known as:  GLUCOPHAGE Take 1 tablet (500 mg total) by mouth daily with breakfast.   metoprolol succinate 25 MG 24 hr tablet Commonly known as:  TOPROL-XL Take 25 mg by mouth daily.   ondansetron 4 MG disintegrating tablet Commonly known as:  ZOFRAN ODT Take 1 tablet (4 mg total) every 8 (eight) hours as needed by mouth for nausea or vomiting.   traZODone 50 MG tablet Commonly known as:  DESYREL Take 1 tablet (50 mg total) by mouth at bedtime as needed for sleep.      Follow-up Information    Jerrol Banana., MD. Schedule an appointment as soon as possible for a visit in 1 week(s).   Specialty:  Family Medicine Contact information: [address] [place] [address] [phone]        Yolonda Kida, MD .   Specialties:  Cardiology, Internal Medicine Contact information: [address] [place] [address] [phone]          Allergies  Allergen Reactions  . Lotrel [Amlodipine Besy-Benazepril Hcl] Anaphylaxis  . Contrast Media [Iodinated Diagnostic Agents] Swelling and Other (See Comments)    Reaction:  Neck swelling   . Niacin Hives  . Sulfa Antibiotics Hives    Consultations: Cardiology  Procedures/Studies: Dg Chest 2 View  Result Date: 03/18/2018 CLINICAL DATA:  Found unresponsive in kitchen by her husband this afternoon, history of seizures EXAM: CHEST - 2 VIEW COMPARISON:   09/28/2015 FINDINGS: Upper normal heart size. Mediastinal contours and pulmonary vascularity normal. Lungs clear. No pleural effusion or pneumothorax. Bones demineralized. IMPRESSION: No acute abnormalities. Electronically Signed   By: Lavonia Dana M.D.   On: 03/18/2018 17:24   Ct Head Wo Contrast  Result Date: 03/18/2018 CLINICAL DATA:  Patient found unresponsive at home today. EXAM: CT HEAD WITHOUT CONTRAST TECHNIQUE: Contiguous axial images were obtained from the base of the skull through the vertex without intravenous contrast. COMPARISON:  Brain MRI 09/27/2015.  Head CT scan 07/21/2015. FINDINGS: Brain: No evidence of acute infarction, hemorrhage, hydrocephalus, extra-axial collection or mass lesion/mass effect. Vascular: No hyperdense vessel or unexpected calcification. Skull: Intact. Sinuses/Orbits: No acute abnormality. The patient is status post bilateral lens extraction. Other: None. IMPRESSION: Negative head CT. Electronically Signed   By: Inge Rise M.D.   On: 03/18/2018 17:32   US Soft Tissue Head & Neck (non-thyroid)  Result Date: 02/22/2018 It was elected to repeat her thyroid ultrasound provide reassurance that the nodule previously evaluated was unchanged and did not require surgical excision.  Right lobe of the thyroid  measured 1.3 x 1.69 x 5.2 cm.  There is a dominant nodule in the inferior pole measuring 1.29 x 1.8 x 1.93 cm.  Marked vascularity of the lesion is noted on duplex imaging.  There is a small nodule in the mid/upper pole measuring 0.35 x 0.45 x 0.55 cm.  In the mid body a small 0.34 x 0.39 x 0.54 nodule is appreciated.  The left lobe of the thyroid measures 1.4 x 1.48 x 5.87 cm.  There are 2 small hypoechoic nodules in the midportion measuring 0.39 x 0.51 x 0.67 and 0.26 x 0.34 x 0.37.  No increased vascular flow noted on duplex imaging.  View of the December 13, 2016 ultrasound exam completed at her endocrinologist office showed that the dominant mass measured 1.42 x  1.64 x 1.85 cm at that time.  The present measurements of 1.29 x 1.8 x 1.93 cm are not significantly different.  In light of the normal cytology from February 2018, and no significant interval change over the past year, repeat FNA sampling was not appropriate.  Dg Hip Unilat W Or Wo Pelvis 2-3 Views Right  Result Date: 03/18/2018 CLINICAL DATA:  Found unresponsive in kitchen by her husband this afternoon, history of seizures, RIGHT hip pain EXAM: DG HIP (WITH OR WITHOUT PELVIS) 2-3V RIGHT COMPARISON:  None FINDINGS: Numerous pelvic phleboliths. Prior lower lumbar fusion L4-L5. Bones appear slightly demineralized. Hip and SI joint spaces preserved. No acute fracture, dislocation, or bone destruction. IMPRESSION: No acute osseous abnormalities. Electronically Signed   By: Lavonia Dana M.D.   On: 03/18/2018 17:23   Echo  Subjective: Patient seen and examined the bedside this morning.  Remains comfortable.  No new events/complaints.  Stable to be discharged home today after loop recorder placement.  Discharge Exam: Vitals:   03/21/18 0131 03/21/18 0445  BP: 120/63 (!) 106/59  Pulse: 65 60  Resp: 18 16  Temp: 98.2 F (36.8 C) 98.5 F (36.9 C)  SpO2:  98%   Vitals:   03/20/18 1419 03/20/18 2110 03/21/18 0131 03/21/18 0445  BP: (!) 108/59 111/65 120/63 (!) 106/59  Pulse: 76 70 65 60  Resp: 20 19 18 16   Temp: 98.2 F (36.8 C) 98 F (36.7 C) 98.2 F (36.8 C) 98.5 F (36.9 C)  TempSrc: Oral Oral Oral Oral  SpO2: 100% 100%  98%  Weight:    63.3 kg (139 lb 8 oz)  Height:        General: Pt is alert, awake, not in acute distress Cardiovascular: RRR, S1/S2 +, no rubs, no gallops Respiratory: CTA bilaterally, no wheezing, no rhonchi Abdominal: Soft, NT, ND, bowel sounds + Extremities: Trace edema, no cyanosis    The results of significant diagnostics from this hospitalization (including imaging, microbiology, ancillary and laboratory) are listed below for reference.      Microbiology: No results found for this or any previous visit (from the past 240 hour(s)).   Labs: BNP (last 3 results) No results for input(s): BNP in the last 8760 hours. Basic Metabolic Panel: Recent Labs  Lab 03/18/18 1816 03/19/18 0555 03/21/18 0527  NA 141 138 140  K 4.0 3.7 3.7  CL 108 106 110  CO2 27 25 24   GLUCOSE 95 87 82  BUN 27* 21* 22*  CREATININE 1.42* 1.22* 1.16*  CALCIUM 8.7* 8.5* 8.4*   Liver Function Tests: No results for input(s): AST, ALT, ALKPHOS, BILITOT, PROT, ALBUMIN in the last 168 hours. No results for input(s): LIPASE, AMYLASE in the last  168 hours. No results for input(s): AMMONIA in the last 168 hours. CBC: Recent Labs  Lab 03/18/18 1816 03/19/18 0555  WBC 11.0* 8.4  NEUTROABS 9.3*  --   HGB 8.3* 8.3*  HCT 26.8* 25.9*  MCV 83.2 82.7  PLT 441* 383   Cardiac Enzymes: Recent Labs  Lab 03/18/18 2352 03/19/18 0555 03/19/18 1133  TROPONINI <0.03 <0.03 <0.03   BNP: Invalid input(s): POCBNP CBG: Recent Labs  Lab 03/18/18 1647 03/19/18 0733 03/20/18 0428 03/21/18 0459  GLUCAP 80 87 77 85   D-Dimer No results for input(s): DDIMER in the last 72 hours. Hgb A1c No results for input(s): HGBA1C in the last 72 hours. Lipid Profile No results for input(s): CHOL, HDL, LDLCALC, TRIG, CHOLHDL, LDLDIRECT in the last 72 hours. Thyroid function studies No results for input(s): TSH, T4TOTAL, T3FREE, THYROIDAB in the last 72 hours.  Invalid input(s): FREET3 Anemia work up Recent Labs    03/18/18 2327 03/18/18 2352  VITAMINB12 92*  --   FOLATE 4.9*  --   FERRITIN 10*  --   TIBC 305  --   IRON 39  --   RETICCTPCT  --  0.9   Urinalysis    Component Value Date/Time   COLORURINE YELLOW 03/18/2018 2157   APPEARANCEUR CLEAR 03/18/2018 2157   APPEARANCEUR Clear 06/28/2015 1055   LABSPEC 1.020 03/18/2018 2157   LABSPEC 1.016 02/17/2014 2031   PHURINE 8.0 03/18/2018 2157   GLUCOSEU NEGATIVE 03/18/2018 2157   GLUCOSEU Negative  02/17/2014 2031   HGBUR NEGATIVE 03/18/2018 2157   BILIRUBINUR NEGATIVE 03/18/2018 2157   BILIRUBINUR Negative 06/28/2015 1055   BILIRUBINUR Negative 02/17/2014 2031   KETONESUR NEGATIVE 03/18/2018 2157   PROTEINUR NEGATIVE 03/18/2018 2157   UROBILINOGEN 0.2 11/15/2012 1507   NITRITE NEGATIVE 03/18/2018 2157   LEUKOCYTESUR NEGATIVE 03/18/2018 2157   LEUKOCYTESUR Negative 06/28/2015 1055   LEUKOCYTESUR Negative 02/17/2014 2031   Sepsis Labs Invalid input(s): PROCALCITONIN,  WBC,  LACTICIDVEN Microbiology No results found for this or any previous visit (from the past 240 hour(s)).   Time coordinating discharge: Over 30 minutes  SIGNED:   Shelly Coss, MD  [facility] 03/21/2018, 12:07 PM Pager [phone]  If 7PM-7AM, please contact night-coverage [email or web address] Password [email or web address]

## 2018-03-21 NOTE — Discharge Instructions (Signed)
Implantable Loop Recorder Placement, Care After  Refer to this sheet in the next few weeks. These instructions provide you with information about caring for yourself after your procedure. Your health care provider may also give you more specific instructions. Your treatment has been planned according to current medical practices, but problems sometimes occur. Call your health care provider if you have any problems or questions after your procedure.  What can I expect after the procedure?  After the procedure, it is common to have:   Soreness or pain near the cut from surgery (incision).   Some swelling or bruising near the incision.    Follow these instructions at home:  Medicines   Take over-the-counter and prescription medicines only as told by your health care provider.   If you were prescribed an antibiotic medicine, take it as told by your health care provider. Do not stop taking the antibiotic even if you start to feel better.  Bathing   Do not take baths, swim, or use a hot tub until your health care provider approves. Ask your health care provider if you can take showers. You may only be allowed to take sponge baths for bathing.  Incision care   Follow instructions from your health care provider about how to take care of your incision. Make sure you:  ? Wash your hands with soap and water before you change your bandage (dressing). If soap and water are not available, use hand sanitizer.  ? Change your dressing as told by your health care provider.  ? Keep your dressing dry.  ? Leave stitches (sutures), skin glue, or adhesive strips in place. These skin closures may need to stay in place for 2 weeks or longer. If adhesive strip edges start to loosen and curl up, you may trim the loose edges. Do not remove adhesive strips completely unless your health care provider tells you to do that.   Check your incision area every day for signs of infection. Check for:  ? More redness, swelling, or pain.  ? Fluid  or blood.  ? Warmth.  ? Pus or a bad smell.  Driving   If you received a sedative, do not drive for 24 hours after the procedure.   If you did not receive a sedative, ask your health care provider when it is safe to drive.  Activity   Return to your normal activities as told by your health care provider. Ask your health care provider what activities are safe for you.   Until your health care provider says it is safe:  ? Do not lift anything that is heavier than 10 lb (4.5 kg).  ? Do not do activities that involve lifting your arms over your head.  General instructions     Follow instructions from your health care provider about how and when to use your implantable loop recorder.   Do not go through a metal detection gate, and do not let someone hold a metal detector over your chest. Show your ID card.   Do not have an MRI unless you check with your health care provider first.   Do not use any tobacco products, such as cigarettes, chewing tobacco, and e-cigarettes. Tobacco can delay healing. If you need help quitting, ask your health care provider.   Keep all follow-up visits as told by your health care provider. This is important.  Contact a health care provider if:   You have more redness, swelling, or pain around your incision.     You have more fluid or blood coming from your incision.   Your incision feels warm to the touch.   You have pus or a bad smell coming from your incision.   You have a fever.   You have pain that is not relieved by your pain medicine.   You have triggered your device because of fainting (syncope) or because of a heartbeat that feels like it is racing, slow, fluttering, or skipping (palpitations).  Get help right away if:   You have chest pain.   You have difficulty breathing.  This information is not intended to replace advice given to you by your health care provider. Make sure you discuss any questions you have with your health care provider.  Document Released:  11/14/2015 Document Revised: 05/10/2016 Document Reviewed: 09/07/2015  Elsevier Interactive Patient Education  2018 [employer]

## 2018-03-24 ENCOUNTER — Encounter (HOSPITAL_COMMUNITY): Payer: Self-pay | Admitting: Cardiovascular Disease

## 2018-03-25 ENCOUNTER — Encounter: Payer: Self-pay | Admitting: Family Medicine

## 2018-03-25 ENCOUNTER — Ambulatory Visit (INDEPENDENT_AMBULATORY_CARE_PROVIDER_SITE_OTHER): Payer: Medicare HMO | Admitting: Family Medicine

## 2018-03-25 VITALS — BP 120/60 | HR 52 | Temp 98.5°F | Resp 16 | Ht 63.0 in | Wt 135.0 lb

## 2018-03-25 DIAGNOSIS — Z0000 Encounter for general adult medical examination without abnormal findings: Secondary | ICD-10-CM

## 2018-03-25 NOTE — Progress Notes (Signed)
Patient: Julie Acevedo, Female    DOB: 12-14-1951, 67 y.o.   MRN: [id number] Visit Date: 03/25/2018  Today's Provider: [name], MD   Chief Complaint  Patient presents with  . Annual Exam   Subjective:    Annual physical exam DAJAE Acevedo is a 67 y.o. female who presents today for health maintenance and complete physical. She feels fairly well, considering she was in the hospital for syncope on 03/18/18. She reports she is not exercising but does a lot of house work. She reports she is sleeping fairly well.  -----------------------------------------------------------------  Colonoscopy- 06/03/13 Dr. Alric Quan Internal and external hemorrhoids, diverticulosis. Otherwise normal Mammogram- 11/04/17 probably benign repeat diagnostic mammogram in 1 year BMD- 02/07/10 Pap- s/p hysterectomy  She was admitted through the ED on 03/18/18- 03/21/18. She was admitted for syncope. She is being seen by cardiology. She had a loop recorder on 03/21/18.   Review of Systems  Constitutional: Positive for fatigue.  HENT: Negative.   Eyes: Positive for itching.  Respiratory: Positive for cough.   Cardiovascular: Negative.   Gastrointestinal: Positive for constipation.  Endocrine: Negative.   Musculoskeletal: Positive for arthralgias and back pain.  Skin: Negative.   Allergic/Immunologic: Negative.   Neurological: Positive for dizziness and light-headedness.  Hematological: Negative.   Psychiatric/Behavioral: Negative.     Social History      She  reports that she has never smoked. She has never used smokeless tobacco. She reports that she does not drink alcohol or use drugs.       Social History   Socioeconomic History  . Marital status: Married    Spouse name: Not on file  . Number of children: 3  . Years of education: 22  . Highest education level: Some college, no degree  Occupational History  . Occupation: IT consultant: RETIRED  Social Needs  . Financial resource  strain: Not hard at all  . Food insecurity:    Worry: Never true    Inability: Never true  . Transportation needs:    Medical: No    Non-medical: No  Tobacco Use  . Smoking status: Never Smoker  . Smokeless tobacco: Never Used  Substance and Sexual Activity  . Alcohol use: No  . Drug use: No  . Sexual activity: Never  Lifestyle  . Physical activity:    Days per week: Not on file    Minutes per session: Not on file  . Stress: To some extent  Relationships  . Social connections:    Talks on phone: Not on file    Gets together: Not on file    Attends religious service: Not on file    Active member of club or organization: Not on file    Attends meetings of clubs or organizations: Not on file    Relationship status: Not on file  Other Topics Concern  . Not on file  Social History Narrative   Lives with husband, daughter and granddaughter.;   Patient drinks about 3 cups of caffeine daily.   Patient is left handed.     Past Medical History:  Diagnosis Date  . Arthritis   . Diverticulitis   . DM (diabetes mellitus) ([place])    typ e 2  . HTN (hypertension)   . Memory difficulties 09/01/2015  . Short-term memory loss      Patient Active Problem List   Diagnosis Date Noted  . Syncope 03/18/2018  . Anemia 03/18/2018  .  Exophthalmos 02/22/2018  . Multiple thyroid nodules 02/22/2018  . 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 Pain at surgical site 08/30/2016  . Ventral incisional hernia 08/21/2016  . SBO (small bowel obstruction) ([place])   . Partial small bowel obstruction ([place]) 04/23/2016  . Adjustment disorder with mixed anxiety and depressed mood 04/23/2016  . Memory difficulties 09/01/2015  . Arthritis 06/11/2015  . Back pain, chronic 06/11/2015  . HLD (hyperlipidemia) 06/11/2015  . Cannot sleep 06/11/2015  . L-S radiculopathy 06/11/2015  . Arthritis of knee, degenerative 06/11/2015  . B12 deficiency 06/11/2015  . 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 DM2 (diabetes mellitus, type 2) ([place]) 11/15/2012  . HTN (hypertension) 01/27/2012  . Adiposity 11/21/2009  . Avitaminosis D 11/07/2009  . Narrowing of intervertebral disc space 07/25/2009  . Benign essential HTN 07/25/2009    Past Surgical History:  Procedure Laterality Date  . ABDOMINAL HYSTERECTOMY     due to endometriosis-1 ovary left  . BREAST EXCISIONAL BIOPSY Right 1971   neg  . CARDIAC CATHETERIZATION  2000   no significant CAD per note of Dr Caryl Comes in 2013   . CHOLECYSTECTOMY    . CYSTOCELE REPAIR N/A 03/19/2017   Procedure: ANTERIOR REPAIR (CYSTOCELE);  Surgeon: Bjorn Loser, MD;  Location: [facility];  Service: Urology;  Laterality: N/A;  . CYSTOSCOPY N/A 03/19/2017   Procedure: CYSTOSCOPY;  Surgeon: Bjorn Loser, MD;  Location: [facility];  Service: Urology;  Laterality: N/A;  . EYE SURGERY    . FOOT SURGERY    . HAND SURGERY    . HEMICOLECTOMY    . INSERTION OF MESH N/A 08/21/2016   Procedure: INSERTION OF MESH;  Surgeon: Excell Seltzer, MD;  Location: [facility];  Service: General;  Laterality: N/A;  . LAPAROSCOPIC LYSIS OF ADHESIONS N/A 08/21/2016   Procedure: LAPAROSCOPIC LYSIS OF ADHESIONS;  Surgeon: Excell Seltzer, MD;  Location: [facility];  Service: General;  Laterality: N/A;  . LAPAROSCOPIC REMOVAL ABDOMINAL MASS    . LOOP RECORDER INSERTION N/A 03/21/2018   Procedure: LOOP RECORDER INSERTION;  Surgeon: Sanda Klein, MD;  Location: [facility];  Service: Cardiovascular;  Laterality: N/A;  . LUMBAR [place]    . ROUX-EN-Y GASTRIC BYPASS  2010   Dr Hassell Done  . TONSILLECTOMY    . UPPER GI ENDOSCOPY  01/12/16   normal larynx, normal esophagus. gastric bypass with a normal-sized pouch and intact staple line, normal examined jejunum, otherwise exam was normal  . VENTRAL HERNIA REPAIR N/A 08/21/2016   Procedure: LAPAROSCOPIC VENTRAL HERNIA;  Surgeon: Excell Seltzer, MD;  Location: [facility];  Service: General;   Laterality: N/A;    Family History        Family Status  Relation Name Status  . Father  Deceased at age 67  . Mother  Deceased at age 38  . PGM  Deceased  . MGM  Deceased  . MGF  Deceased at age 37  . PGF  Deceased       in his 83s MI  . Ethlyn Daniels  (Not Specified)  . Ethlyn Daniels  (Not Specified)        Her family history includes Breast cancer in her paternal grandmother; Breast cancer (age of onset: 85) in her paternal aunt; Breast cancer (age of onset: 27) in her paternal aunt; COPD in her mother; Cancer in her paternal aunt and paternal grandmother; Cancer (age of onset: 53) in her father; Congestive Heart Failure  in her maternal grandmother; Coronary artery disease (age of onset: 50) in her father; Diabetes in her mother; Emphysema in her maternal grandfather; Heart attack in her paternal grandfather; Hypertension in her mother.      Allergies  Allergen Reactions  . Lotrel [Amlodipine Besy-Benazepril Hcl] Anaphylaxis  . Contrast Media [Iodinated Diagnostic Agents] Swelling and Other (See Comments)    Reaction:  Neck swelling   . Niacin Hives  . Sulfa Antibiotics Hives     Current Outpatient Medications:  .  gabapentin (NEURONTIN) 100 MG capsule, Take 2 capsules (200 mg total) by mouth 3 (three) times daily., Disp: 540 capsule, Rfl: 1 .  linaclotide (LINZESS) 290 MCG CAPS capsule, Take 1 capsule (290 mcg total) by mouth daily before breakfast., Disp: 90 capsule, Rfl: 3 .  meloxicam (MOBIC) 15 MG tablet, Take 15 mg by mouth daily. , Disp: , Rfl:  .  metFORMIN (GLUCOPHAGE) 500 MG tablet, Take 1 tablet (500 mg total) by mouth daily with breakfast., Disp: 90 tablet, Rfl: 0 .  metoprolol succinate (TOPROL-XL) 25 MG 24 hr tablet, Take 25 mg by mouth daily., Disp: , Rfl:  .  ondansetron (ZOFRAN ODT) 4 MG disintegrating tablet, Take 1 tablet (4 mg total) every 8 (eight) hours as needed by mouth for nausea or vomiting., Disp: 21 tablet, Rfl: 0 .  traZODone (DESYREL) 50 MG tablet, Take 1  tablet (50 mg total) by mouth at bedtime as needed for sleep., Disp: 90 tablet, Rfl: 3 .  folic acid (FOLVITE) 1 MG tablet, Take 1 tablet (1 mg total) by mouth daily. (Patient not taking: Reported on 03/25/2018), Disp: 30 tablet, Rfl: 0 .  Melatonin 10 MG TABS, Take 10 mg by mouth at bedtime as needed (for sleep.). , Disp: , Rfl:    Patient Care Team: Jerrol Banana., MD as PCP - General (Family Medicine) Yolonda Kida, MD as PCP - Cardiology (Cardiology) Lorelee Cover., MD as Consulting Physician (Ophthalmology) Bjorn Loser, MD as Consulting Physician (Urology) Bary Castilla, Forest Gleason, MD as Consulting Physician (General Surgery)      Objective:   Vitals: BP 120/60 (BP Location: Left Arm, Patient Position: Sitting, Cuff Size: Normal)   Pulse (!) 52   Temp 98.5 F (36.9 C) (Oral)   Resp 16   Ht 5\' 3"  (1.6 m)   Wt 135 lb (61.2 kg)   BMI 23.91 kg/m    Vitals:   03/25/18 0904  BP: 120/60  Pulse: (!) 52  Resp: 16  Temp: 98.5 F (36.9 C)  TempSrc: Oral  Weight: 135 lb (61.2 kg)  Height: 5\' 3"  (1.6 m)     Physical Exam  Constitutional: She is oriented to person, place, and time. She appears well-developed and well-nourished.  HENT:  Head: Normocephalic and atraumatic.  Right Ear: External ear normal.  Left Ear: External ear normal.  Nose: Nose normal.  Mouth/Throat: Oropharynx is clear and moist.  Eyes: Pupils are equal, round, and reactive to light. Conjunctivae and EOM are normal. No scleral icterus.  Neck: No thyromegaly present.  Cardiovascular: Normal rate, regular rhythm, normal heart sounds and intact distal pulses.  Pulmonary/Chest: Effort normal and breath sounds normal.  Abdominal: Soft.  Genitourinary:  Genitourinary Comments: DRE declined by pt.  Musculoskeletal: She exhibits no edema.  Lymphadenopathy:    She has no cervical adenopathy.  Neurological: She is alert and oriented to person, place, and time.  Skin: Skin is warm and dry.    Psychiatric: She has a normal mood  and affect. Her behavior is normal. Judgment and thought content normal.     Depression Screen PHQ 2/9 Scores 02/04/2018 02/28/2017  PHQ - 2 Score 1 0  PHQ- 9 Score - 5      Assessment & Plan:     Routine Health Maintenance and Physical Exam  Exercise Activities and Dietary recommendations Goals    . DIET - INCREASE WATER INTAKE     Recommend increasing water intake to 4-6 glasses a day.        Immunization History  Administered Date(s) Administered  . Influenza Split 10/17/2010, 10/02/2011, 08/27/2012  . Influenza,inj,Quad PF,6+ Mos 09/26/2013, 09/06/2014, 09/06/2016  . Influenza,inj,Quad PF,6-35 Mos 08/31/2017  . Influenza-Unspecified 09/15/2015  . Pneumococcal Polysaccharide-23 11/17/2012  . Tdap 10/02/2011, 09/27/2016  . Zoster 09/06/2016    Health Maintenance  Topic Date Due  . HEMOGLOBIN A1C  05/19/2018  . INFLUENZA VACCINE  07/17/2018  . OPHTHALMOLOGY EXAM  08/12/2018  . FOOT EXAM  11/18/2018  . URINE MICROALBUMIN  11/18/2018  . MAMMOGRAM  11/05/2019  . COLONOSCOPY  06/04/2023  . TETANUS/TDAP  09/27/2026  . Hepatitis C Screening  Completed  . HIV Screening  Completed     Discussed health benefits of physical activity, and encouraged her to engage in regular exercise appropriate for her age and condition.  Recent Syncope Cardiology f/u pending. S/p gastric bypass GAD   --------------------------------------------------------------------  I have done the exam and reviewed the chart and it is accurate to the best of my knowledge. Development worker, community has been used and  any errors in dictation or transcription are unintentional. Miguel Aschoff M.D. [place], MD  [facility]

## 2018-04-04 ENCOUNTER — Ambulatory Visit: Payer: Medicare HMO

## 2018-04-08 ENCOUNTER — Telehealth: Payer: Self-pay | Admitting: Cardiology

## 2018-04-08 NOTE — Telephone Encounter (Signed)
Spoke w/ pt and requested that he send a manual transmission b/c his home monitor has not updated in at least 14 days.   

## 2018-04-10 ENCOUNTER — Ambulatory Visit (INDEPENDENT_AMBULATORY_CARE_PROVIDER_SITE_OTHER): Payer: Self-pay | Admitting: *Deleted

## 2018-04-10 DIAGNOSIS — R55 Syncope and collapse: Secondary | ICD-10-CM

## 2018-04-10 LAB — CUP PACEART INCLINIC DEVICE CHECK
Date Time Interrogation Session: 20190425153352
Implantable Pulse Generator Implant Date: 20190405

## 2018-04-10 NOTE — Progress Notes (Signed)
Wound check in clinic s/p ILR implant. Steri strips removed. Wound without redness or edema. Incision edges approximated and healing well. Normal ILR device function. Battery status: GOOD. R-waves 0.68mV. (4) symptom episodes - patient c/o "head rush", presyncopal sensations - SR per ECGs, 0 tachy episodes, 0 pause episodes, 0 brady episodes. 0 AF episodes (0% burden). Patient education completed including wound care and remote monitoring. Monthly summary reports and ROV with [place] in 3 months.

## 2018-04-21 ENCOUNTER — Other Ambulatory Visit: Payer: Self-pay | Admitting: Family Medicine

## 2018-04-21 ENCOUNTER — Other Ambulatory Visit: Payer: Self-pay

## 2018-04-21 NOTE — Telephone Encounter (Signed)
Pt is leaving to go to [place] onFriday and would like Dr. Rosanna Randy to prescribe her several pills for anxiety for flying.  She uses [facility]

## 2018-04-21 NOTE — Telephone Encounter (Signed)
Xanax 0.25mg --1-2 just prior to flight. #10

## 2018-04-22 ENCOUNTER — Other Ambulatory Visit: Payer: Self-pay

## 2018-04-22 ENCOUNTER — Telehealth: Payer: Self-pay | Admitting: Cardiology

## 2018-04-22 MED ORDER — ALPRAZOLAM 0.25 MG PO TABS: 0.2500 mg | ORAL_TABLET | ORAL | 0 refills | Status: DC

## 2018-04-22 NOTE — Telephone Encounter (Signed)
Spoke w/ pt and requested that she send a manual transmission b/c her home monitor has not updated in at least 14 days.   

## 2018-04-23 ENCOUNTER — Ambulatory Visit (INDEPENDENT_AMBULATORY_CARE_PROVIDER_SITE_OTHER): Payer: Medicare HMO | Admitting: *Deleted

## 2018-04-23 DIAGNOSIS — R55 Syncope and collapse: Secondary | ICD-10-CM

## 2018-04-24 NOTE — Progress Notes (Signed)
[facility] Summary Report / Loop Recorder 

## 2018-04-28 IMAGING — CT CT ABD-PELV W/O CM
2 of 4 series · 15 of 46 positions shown, 17 images · non-contrast
Comparison: 12/14/2015

CLINICAL DATA: Abdominal pain.  History of gastric bypass.

EXAM:
CT ABDOMEN AND PELVIS WITHOUT CONTRAST
TECHNIQUE: Multidetector CT imaging of the abdomen and pelvis was performed
following the standard protocol without IV contrast.

[Series 2: routine abd pel wo · axial · 0.62mm/px · z∈[-912,-518]mm · 12 of 87 slices shown, 14 images]
[im 4/87  soft-tissue]
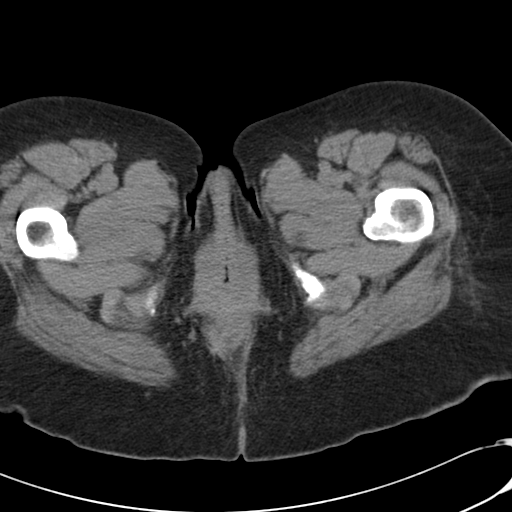
[im 4/87  bone]
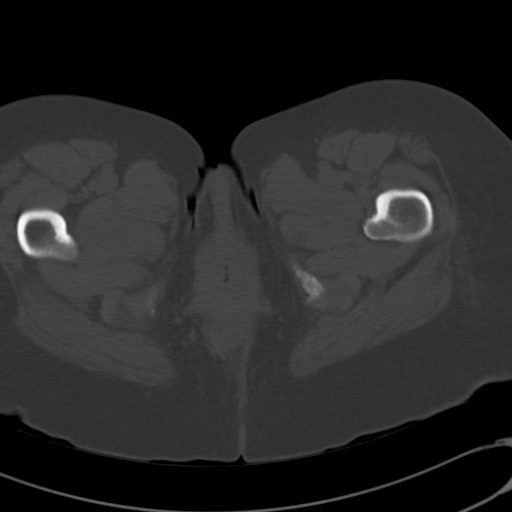
[im 12/87  soft-tissue]
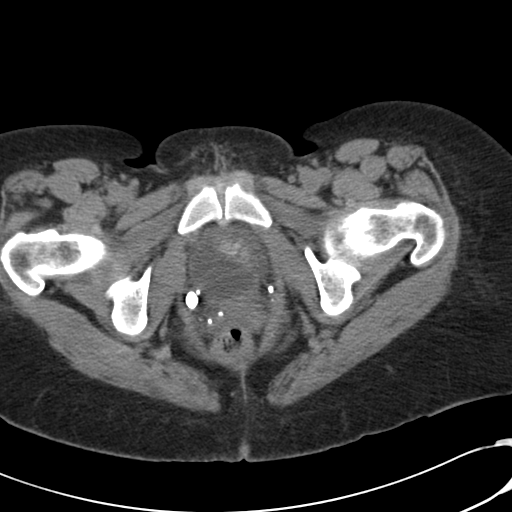
[im 20/87  soft-tissue]
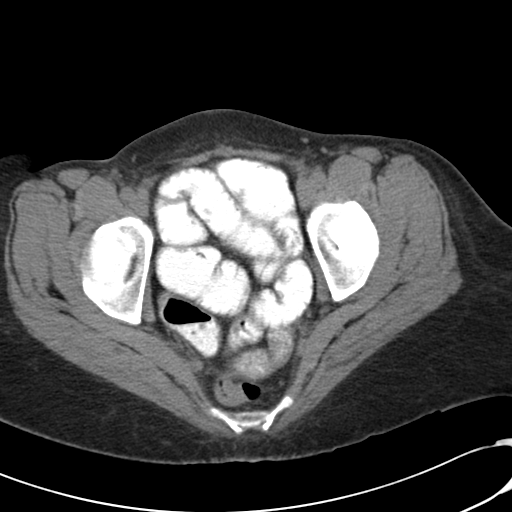
[im 28/87  soft-tissue]
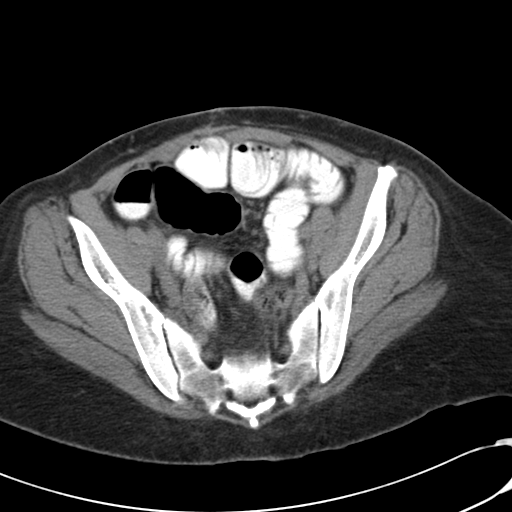
[im 32/87  soft-tissue]
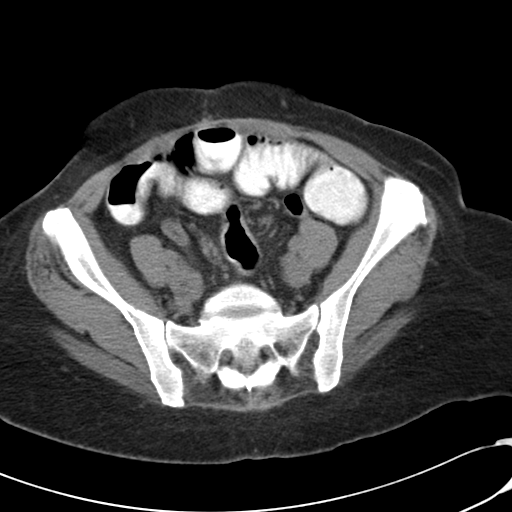
[im 40/87  soft-tissue]
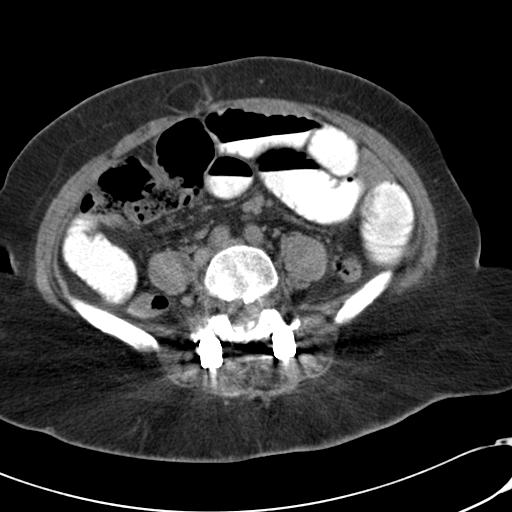
[im 47/87  soft-tissue]
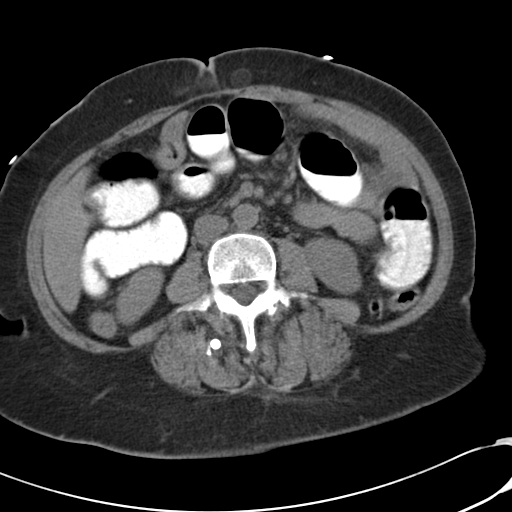
[im 55/87  soft-tissue]
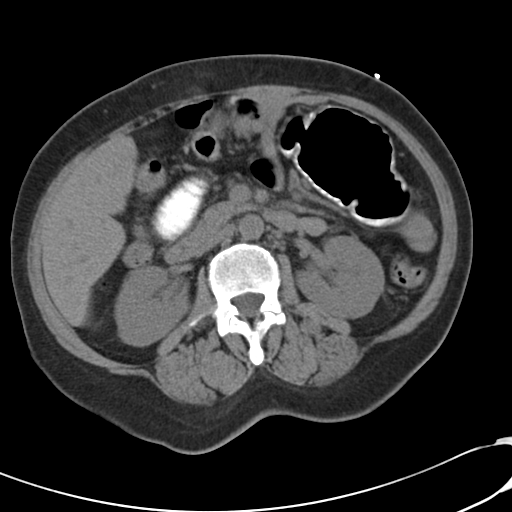
[im 59/87  soft-tissue]
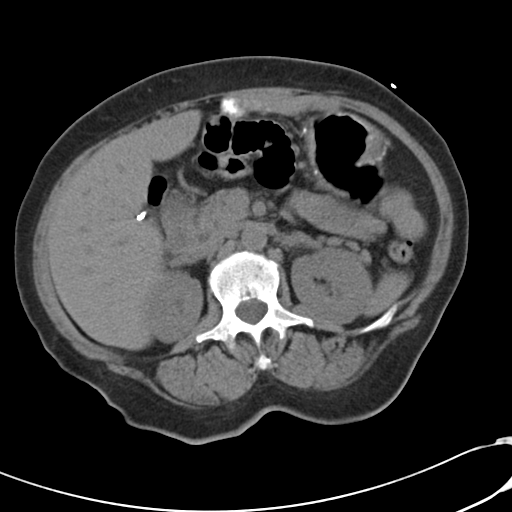
[im 59/87  bone]
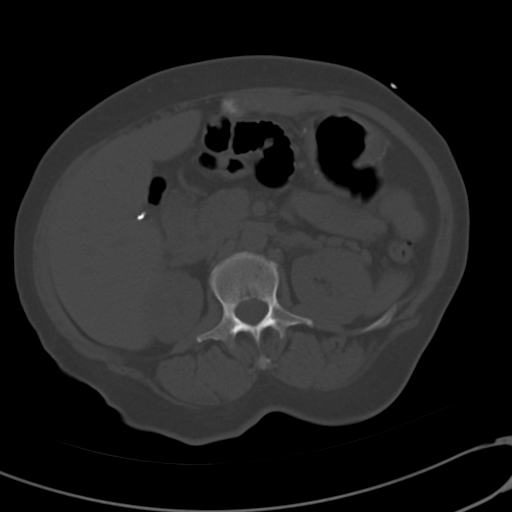
[im 67/87  soft-tissue]
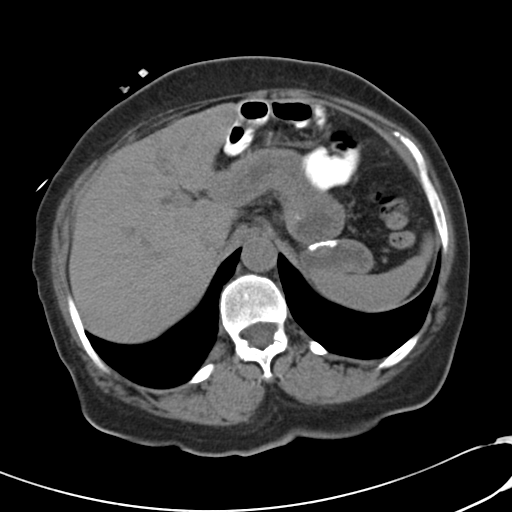
[im 75/87  soft-tissue]
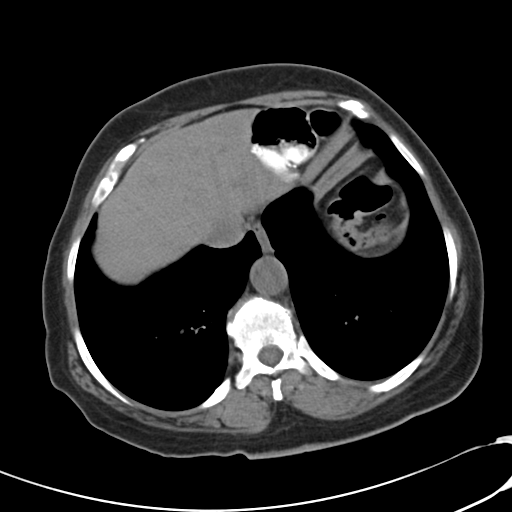
[im 83/87  soft-tissue]
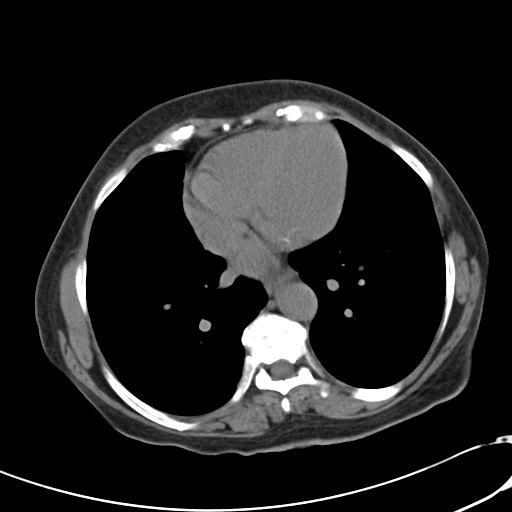

[Series 5: cor routine abd pel wo · coronal · 0.54mm/px · 3 of 115 slices shown]
[im 39/115  soft-tissue]
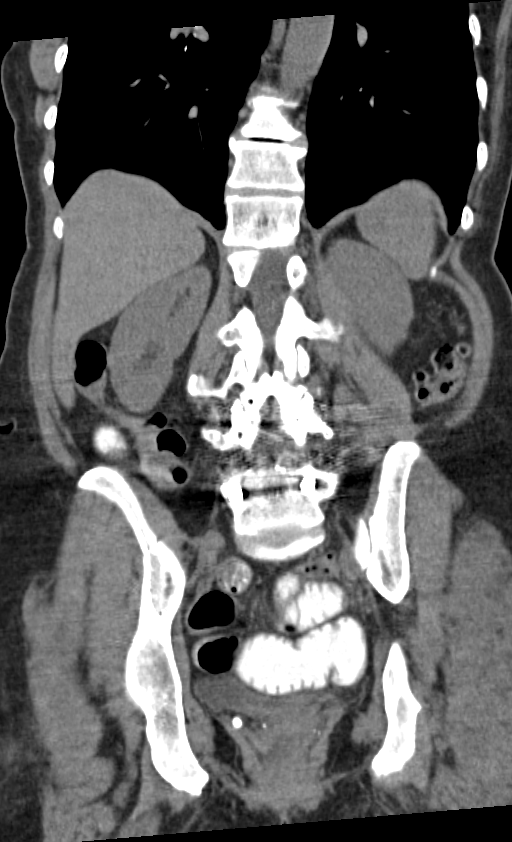
[im 51/115  soft-tissue]
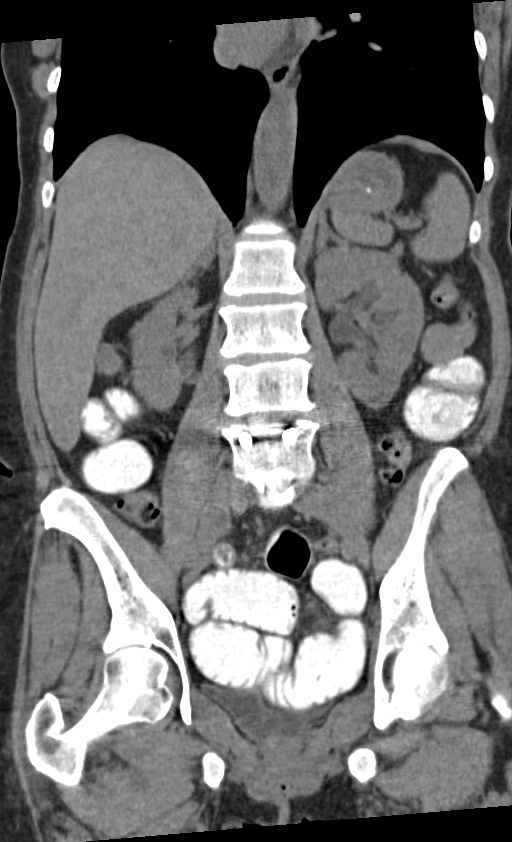
[im 64/115  soft-tissue]
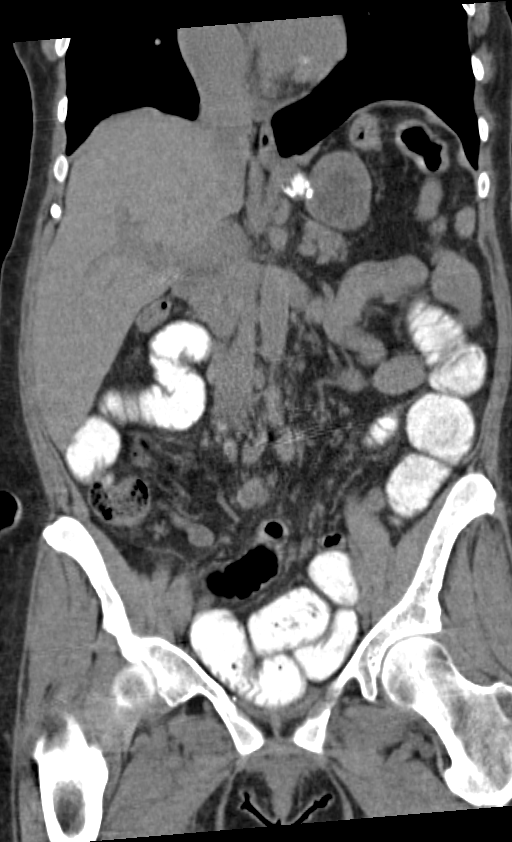

[15 of 46 positions shown; findings below may reference images not displayed]

FINDINGS: Postcholecystectomy

Ventral hernia containing adipose tissue is stable.

Postoperative changes from gastric bypass surgery are noted.

Spleen, liver, adrenal glands, and left kidney are within normal
limits.

Mild right hydronephrosis is stable

Dilated small bowel loops are present with air-fluid levels. Distal
small bowel and colon are decompressed. There is a suspected
transition point in the ileum at the mid pelvic brim on image 56.
There is no focal mass.

L4-5 fusion with pedicle screws is noted. No breakage or loosening
of the hardware.

There is no free fluid and there is no evidence of abnormal
retroperitoneal adenopathy.
IMPRESSION: Partial small bowel obstruction pattern. Transition point occurs in
the distal ileum.

Postoperative changes as described.

Stable right hydronephrosis.

## 2018-04-29 IMAGING — CR DG ABDOMEN ACUTE W/ 1V CHEST
3 series · 3 of 3 positions shown · non-contrast
Comparison: CT 04/23/2016

CLINICAL DATA: Pt admitted yesterday for SBO; pt kept grabbing her
lower abdomen due to pain; pt states this has happened to her
before; Hysterectomy, gallbladder, lumbar disc surgery; Diabetes,
htn, CAD, stroke

EXAM:
DG ABDOMEN ACUTE W/ 1V CHEST

[chest pa]
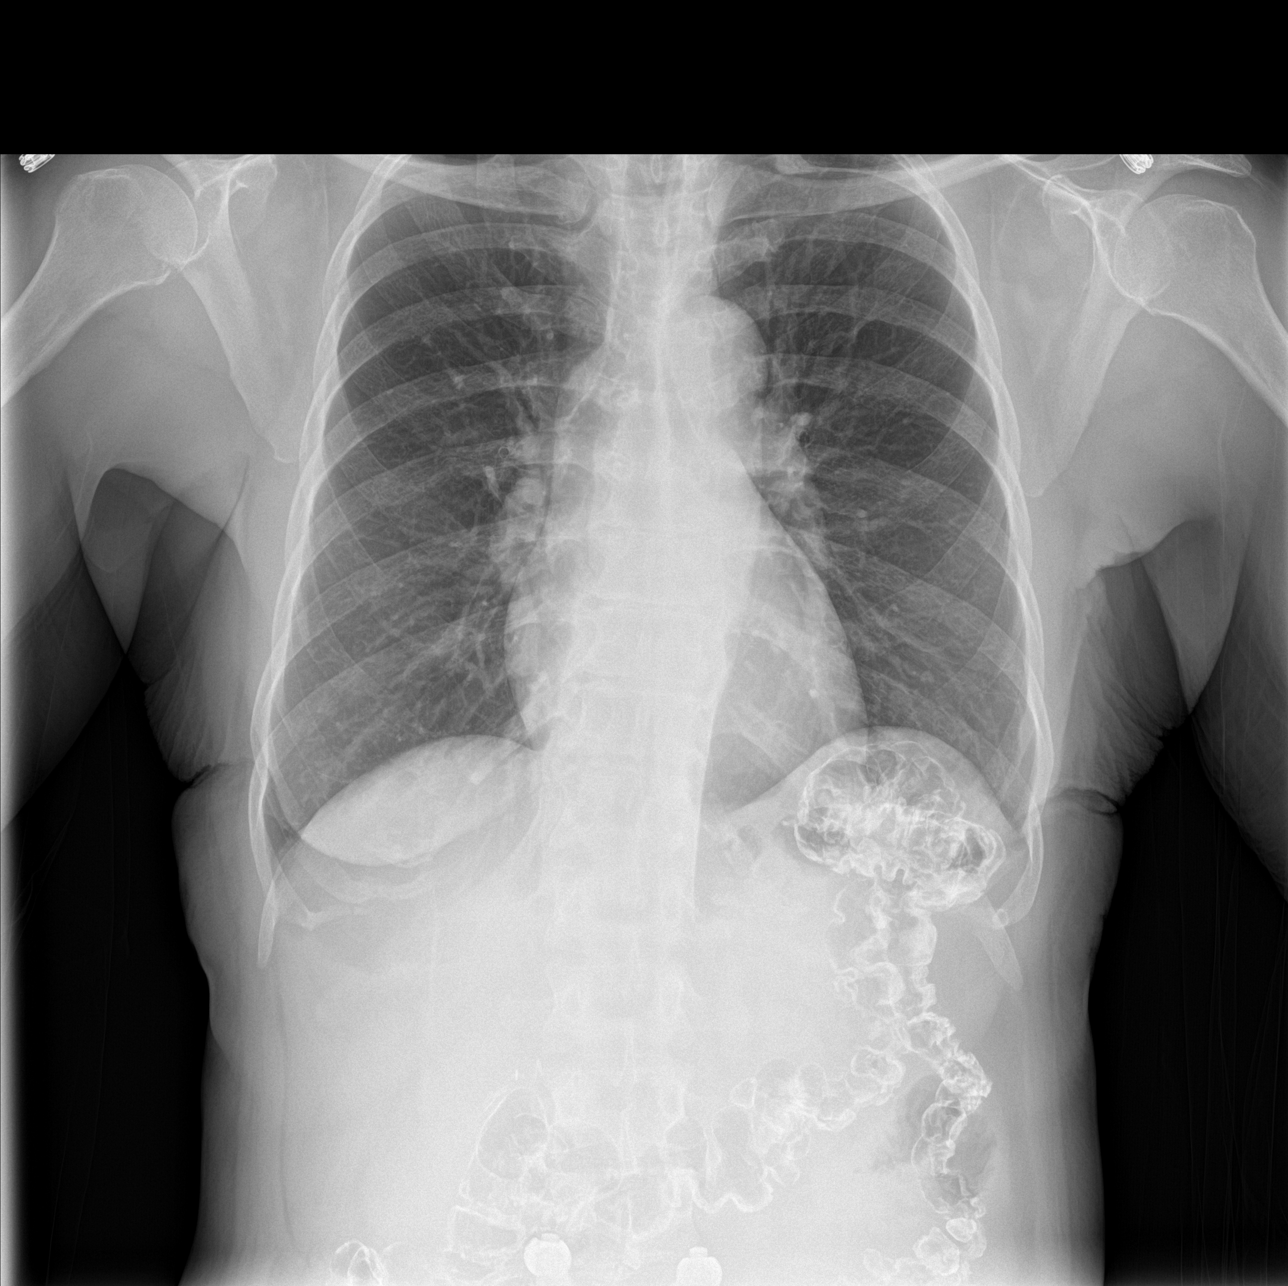

[abdomen erect]
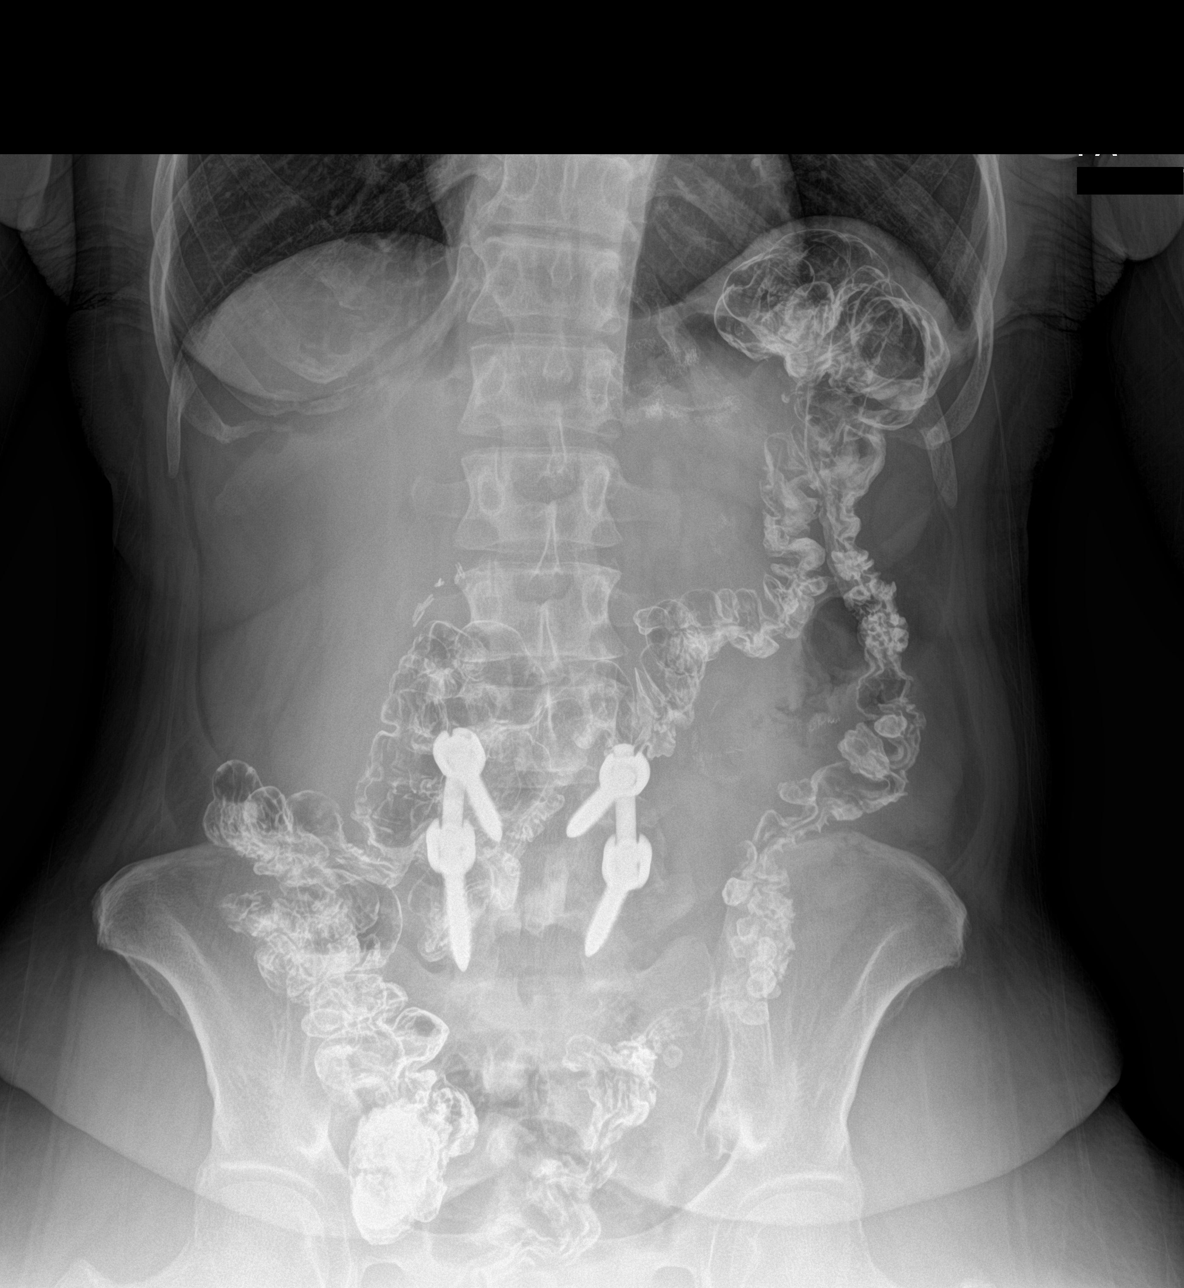

[abdomen supine]
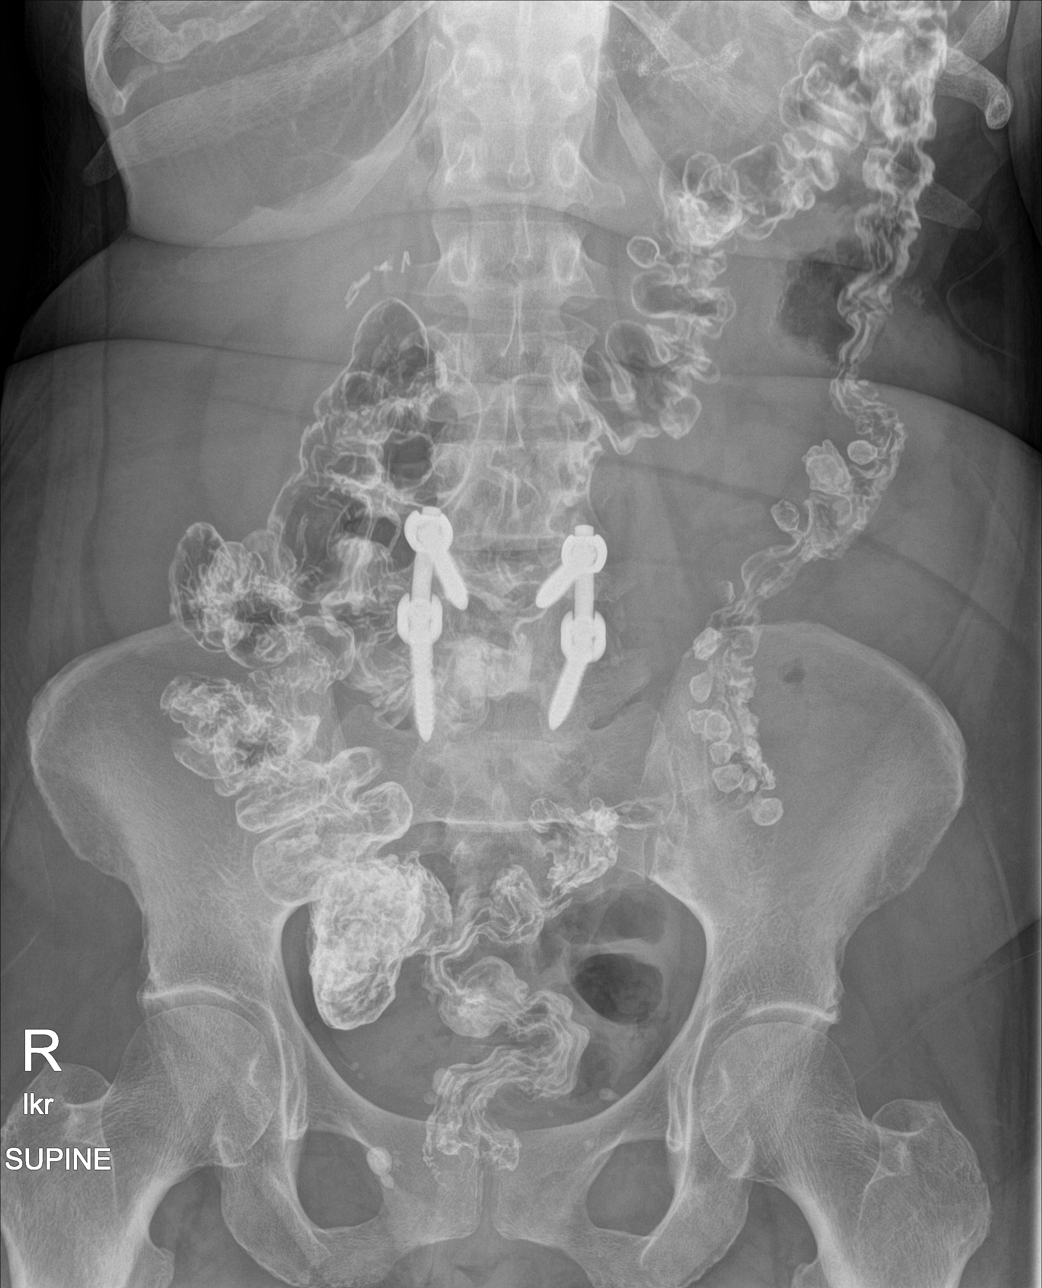

[3 of 3 positions shown; findings below may reference images not displayed]

FINDINGS: Heart size and mediastinal contours are within normal limits.

Lungs are clear. No effusion.

No free air. Small bowel decompressed. Residual oral contrast
material throughout the colon which is non distended. Scattered
sigmoid diverticula are evident.

Bilateral pelvic phleboliths.

Surgical clips near the GE junction and in the right upper abdomen.
Lumbar fixation hardware L4-5 as before.
IMPRESSION: 1. Nonobstructive bowel gas pattern with residual colonic contrast.
2. No acute cardiopulmonary disease.

## 2018-05-14 ENCOUNTER — Telehealth: Payer: Self-pay | Admitting: Cardiology

## 2018-05-14 NOTE — Telephone Encounter (Signed)
Spoke w/ pt and requested that she send a manual transmission b/c her home monitor has not updated in at least 14 days.   

## 2018-05-15 ENCOUNTER — Telehealth: Payer: Self-pay | Admitting: *Deleted

## 2018-05-15 NOTE — Telephone Encounter (Signed)
Spoke with patient regarding "symptom" episode noted on LINQ from 05/13/18 at 0253.  Patient reports she was lying in bed and suddenly felt very weak, felt her heart start racing, then she started coughing.  The coughing and the weakness lasted for several hours and she ended up having to get out of bed to take cough medicine.  She reports that she felt weak when getting out of bed, but denies presyncope/syncope.  Plot/ECG included below.  Advised patient that Dr. Sallyanne Kuster will review strip and we will call her back if any further recommendations.  Patient verbalizes understanding and agreement with plan.  Full ECG also printed and placed in Dr. Langley Gauss folder for review.

## 2018-05-15 NOTE — Telephone Encounter (Signed)
There was indeed a brief episode of irregular atrial tachycardia, but only lasting a few seconds (explains feeling of racing heart beat, but unlikely to be the cause of weakness that persisted for hours). Let's continue to monitor. MCr

## 2018-05-16 NOTE — Telephone Encounter (Signed)
Spoke with patient to update her.  Patient reports she feels that her HR is often elevated, despite taking Toprol-XL as prescribed.  Advised patient that per the data on her ILR, HR trends are stable.  Encouraged her to call with any new, persistent, or worsening symptoms.  Patient verbalizes understanding and is appreciative of call.

## 2018-05-19 LAB — CUP PACEART REMOTE DEVICE CHECK
Date Time Interrogation Session: 20190508193735
Implantable Pulse Generator Implant Date: 20190405

## 2018-05-26 ENCOUNTER — Ambulatory Visit (INDEPENDENT_AMBULATORY_CARE_PROVIDER_SITE_OTHER): Payer: Medicare HMO | Admitting: *Deleted

## 2018-05-26 DIAGNOSIS — R55 Syncope and collapse: Secondary | ICD-10-CM | POA: Diagnosis not present

## 2018-05-27 NOTE — Progress Notes (Signed)
[facility] Summary Report / Loop Recorder 

## 2018-06-09 ENCOUNTER — Ambulatory Visit: Payer: Medicare HMO | Admitting: Podiatry

## 2018-06-25 ENCOUNTER — Ambulatory Visit (INDEPENDENT_AMBULATORY_CARE_PROVIDER_SITE_OTHER): Payer: Medicare HMO | Admitting: Family Medicine

## 2018-06-25 VITALS — BP 122/60 | HR 60 | Temp 98.2°F | Resp 14 | Wt 137.0 lb

## 2018-06-25 DIAGNOSIS — E785 Hyperlipidemia, unspecified: Secondary | ICD-10-CM

## 2018-06-25 DIAGNOSIS — I10 Essential (primary) hypertension: Secondary | ICD-10-CM | POA: Diagnosis not present

## 2018-06-25 DIAGNOSIS — E118 Type 2 diabetes mellitus with unspecified complications: Secondary | ICD-10-CM | POA: Diagnosis not present

## 2018-06-25 DIAGNOSIS — D649 Anemia, unspecified: Secondary | ICD-10-CM | POA: Diagnosis not present

## 2018-06-25 NOTE — Progress Notes (Signed)
Julie Acevedo  MRN: [id number] DOB: 1951-01-17  Subjective:  HPI   The patient is a 67 year old female who presents today for follow up of chronic health.  She was last seen in April with syncope.    Syncope-On her last visit in April she was referred to Cardiology for work up and has seen Dr. Sallyanne Kuster.  She has an event monitor implanted and goes back to see him next week.  She has not had any more episode of syncope but states she still has dizziness, especially when she bends over.  Diabetes-The patient checks her glucose at home and states that she has been getting readings in the range of 70-130.  Her last A1C was done on 11/18/17 and it was 5.8.  Hypertension-The patient's blood pressure has been stable on her last visit.   BP Readings from Last 3 Encounters:  06/25/18 122/60  03/25/18 120/60  03/21/18 124/66   Hypercholesterolemia-the patient has not had her lipids checked in over a year. Lab Results  Component Value Date   CHOL 154 01/18/2014   HDL 74 (H) 01/18/2014   LDLCALC 69 01/18/2014   TRIG 53 01/18/2014   CHOLHDL 2.0 11/16/2012   Anemia-patient has history of anemia.  Lab Results  Component Value Date   WBC 8.4 03/19/2018   HGB 8.3 (L) 03/19/2018   HCT 25.9 (L) 03/19/2018   MCV 82.7 03/19/2018   PLT 383 03/19/2018    Patient Active Problem List   Diagnosis Date Noted  . Syncope 03/18/2018  . Anemia 03/18/2018  . Exophthalmos 02/22/2018  . Multiple thyroid nodules 02/22/2018  . 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 Pain at surgical site 08/30/2016  . Ventral incisional hernia 08/21/2016  . SBO (small bowel obstruction) ([place])   . Partial small bowel obstruction ([place]) 04/23/2016  . Adjustment disorder with mixed anxiety and depressed mood 04/23/2016  . Memory difficulties 09/01/2015  . Arthritis 06/11/2015  . Back pain, chronic 06/11/2015  . HLD (hyperlipidemia) 06/11/2015  . Cannot sleep 06/11/2015  . L-S radiculopathy 06/11/2015  . Arthritis of  knee, degenerative 06/11/2015  . B12 deficiency 06/11/2015  . 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 DM2 (diabetes mellitus, type 2) ([place]) 11/15/2012  . HTN (hypertension) 01/27/2012  . Adiposity 11/21/2009  . Avitaminosis D 11/07/2009  . Narrowing of intervertebral disc space 07/25/2009  . Benign essential HTN 07/25/2009    Past Medical History:  Diagnosis Date  . Arthritis   . Diverticulitis   . DM (diabetes mellitus) ([place])    typ e 2  . HTN (hypertension)   . Memory difficulties 09/01/2015  . Short-term memory loss     Social History   Socioeconomic History  . Marital status: Married    Spouse name: Not on file  . Number of children: 3  . Years of education: 49  . Highest education level: Some college, no degree  Occupational History  . Occupation: IT consultant: RETIRED  Social Needs  . Financial resource strain: Not hard at all  . Food insecurity:    Worry: Never true    Inability: Never true  . Transportation needs:    Medical: No    Non-medical: No  Tobacco Use  . Smoking status: Never Smoker  . Smokeless tobacco: Never Used  Substance and Sexual Activity  . Alcohol use: No  . Drug use: No  . Sexual activity: Never  Lifestyle  .  Physical activity:    Days per week: Not on file    Minutes per session: Not on file  . Stress: To some extent  Relationships  . Social connections:    Talks on phone: Not on file    Gets together: Not on file    Attends religious service: Not on file    Active member of club or organization: Not on file    Attends meetings of clubs or organizations: Not on file    Relationship status: Not on file  . Intimate partner violence:    Fear of current or ex partner: Not on file    Emotionally abused: Not on file    Physically abused: Not on file    Forced sexual activity: Not on file  Other Topics Concern  . Not on file  Social History Narrative   Lives  with husband, daughter and granddaughter.;   Patient drinks about 3 cups of caffeine daily.   Patient is left handed.     Outpatient Encounter Medications as of 06/25/2018  Medication Sig Note  . ALPRAZolam (XANAX) 0.25 MG tablet Take 1 tablet (0.25 mg total) by mouth See admin instructions. 1-2 PO 1 hour prior to flying   . folic acid (FOLVITE) 1 MG tablet Take 1 tablet (1 mg total) by mouth daily.   Marland Kitchen gabapentin (NEURONTIN) 100 MG capsule Take 2 capsules (200 mg total) by mouth 3 (three) times daily.   Marland Kitchen linaclotide (LINZESS) 290 MCG CAPS capsule Take 1 capsule (290 mcg total) by mouth daily before breakfast.   . Melatonin 10 MG TABS Take 10 mg by mouth at bedtime as needed (for sleep.).  01/27/2018: Patient reports she is taking 20 mg at night  . meloxicam (MOBIC) 15 MG tablet Take 15 mg by mouth daily.    . metFORMIN (GLUCOPHAGE) 500 MG tablet Take 1 tablet (500 mg total) by mouth daily with breakfast.   . metoprolol succinate (TOPROL-XL) 25 MG 24 hr tablet Take 25 mg by mouth daily.   . ondansetron (ZOFRAN ODT) 4 MG disintegrating tablet Take 1 tablet (4 mg total) every 8 (eight) hours as needed by mouth for nausea or vomiting.   . traZODone (DESYREL) 50 MG tablet Take 1 tablet (50 mg total) by mouth at bedtime as needed for sleep.    No facility-administered encounter medications on file as of 06/25/2018.     Allergies  Allergen Reactions  . Lotrel [Amlodipine Besy-Benazepril Hcl] Anaphylaxis  . Contrast Media [Iodinated Diagnostic Agents] Swelling and Other (See Comments)    Reaction:  Neck swelling   . Niacin Hives  . Sulfa Antibiotics Hives    Review of Systems  Constitutional: Negative for fever and malaise/fatigue.  Eyes: Negative.   Respiratory: Negative for cough, shortness of breath and wheezing.   Cardiovascular: Negative for chest pain, palpitations, orthopnea, claudication and leg swelling.  Gastrointestinal: Negative.   Skin: Negative.   Neurological: Positive for  dizziness. Negative for headaches.  Endo/Heme/Allergies: Negative.   Psychiatric/Behavioral: Negative.     Objective:  BP 122/60 (BP Location: Right Arm, Patient Position: Sitting, Cuff Size: Normal)   Pulse 60   Temp 98.2 F (36.8 C) (Oral)   Resp 14   Wt 137 lb (62.1 kg)   SpO2 97%   BMI 24.27 kg/m   Physical Exam  Constitutional: She is oriented to person, place, and time and well-developed, well-nourished, and in no distress.  HENT:  Head: Normocephalic and atraumatic.  Right Ear: External ear normal.  Left Ear: External ear normal.  Nose: Nose normal.  Eyes: Conjunctivae are normal. No scleral icterus.  Neck: No thyromegaly present.  Cardiovascular: Normal rate, regular rhythm and normal heart sounds.  Pulmonary/Chest: Effort normal and breath sounds normal.  Abdominal: Soft.  Neurological: She is alert and oriented to person, place, and time. Gait normal. GCS score is 15.  Skin: Skin is warm and dry.  Psychiatric: Mood, memory, affect and judgment normal.    Assessment and Plan :  1. Type 2 diabetes mellitus with complication, without long-term current use of insulin (HCC)  - Hemoglobin A1c - CBC with Differential/Platelet  2. Essential hypertension  - Comprehensive metabolic panel - CBC with Differential/Platelet  3. Hyperlipidemia, unspecified hyperlipidemia type  - Lipid Panel With LDL/HDL Ratio - Comprehensive metabolic panel  4. Anemia, unspecified type  - Comprehensive metabolic panel - CBC with Differential/Platelet 5.health maintenance Prevnar. 6.Possible orthostasis  I have done the exam and reviewed the chart and it is accurate to the best of my knowledge. Development worker, community has been used and  any errors in dictation or transcription are unintentional. Miguel Aschoff M.D. [facility]

## 2018-06-26 ENCOUNTER — Ambulatory Visit: Payer: Medicare HMO | Admitting: Podiatry

## 2018-06-26 ENCOUNTER — Encounter: Payer: Self-pay | Admitting: Podiatry

## 2018-06-26 VITALS — BP 138/71 | HR 62

## 2018-06-26 DIAGNOSIS — E785 Hyperlipidemia, unspecified: Secondary | ICD-10-CM | POA: Diagnosis not present

## 2018-06-26 DIAGNOSIS — I10 Essential (primary) hypertension: Secondary | ICD-10-CM | POA: Diagnosis not present

## 2018-06-26 DIAGNOSIS — E118 Type 2 diabetes mellitus with unspecified complications: Secondary | ICD-10-CM | POA: Diagnosis not present

## 2018-06-26 DIAGNOSIS — E119 Type 2 diabetes mellitus without complications: Secondary | ICD-10-CM | POA: Diagnosis not present

## 2018-06-26 DIAGNOSIS — M2041 Other hammer toe(s) (acquired), right foot: Secondary | ICD-10-CM | POA: Diagnosis not present

## 2018-06-26 DIAGNOSIS — D649 Anemia, unspecified: Secondary | ICD-10-CM | POA: Diagnosis not present

## 2018-06-26 NOTE — Progress Notes (Signed)
This patient presents the office concerned about her second toe on her right foot.  She says that her second toe is lifting up and continues to hit against her big toe on her right  Foot.  She previously had had surgery for the correction of a bunion and a structural problem at the second digit right foot.  She states this been going on for 3 years and there is intermittent pain.  She presents the office today concerned about this condition and desires an evaluation and treatment.  General Appearance  Alert, conversant and in no acute stress.  Vascular  Dorsalis pedis and posterior tibial  pulses are palpable  bilaterally.  Capillary return is within normal limits  bilaterally. Temperature is within normal limits  bilaterally.  Neurologic  Senn-Weinstein monofilament wire test within normal limits  bilaterally. Muscle power within normal limits bilaterally.  Nails normal nails noted with no evidence of any bacterial or fungal infection  Orthopedic  No limitations of motion of motion feet .  No crepitus or effusions noted.  Hammer toe deformity second toe.  S/P foot surgery right foot.  Skin  normotropic skin with no porokeratosis noted bilaterally.  No signs of infections or ulcers noted.    Hammer toe secondary previous surgery.  IE  Discussed this condition with this patient.  Explained that the hammertoe was the result of surgery.  Recommended padding to be worn on the second digit right foot.  Told patient the only correction is surgery which she does not desire.   Gardiner Barefoot DPM

## 2018-06-27 LAB — COMPREHENSIVE METABOLIC PANEL
ALT: 8 IU/L (ref 0–32)
AST: 11 IU/L (ref 0–40)
Albumin/Globulin Ratio: 1.2 (ref 1.2–2.2)
Albumin: 3.6 g/dL (ref 3.6–4.8)
Alkaline Phosphatase: 150 IU/L — ABNORMAL HIGH (ref 39–117)
BUN/Creatinine Ratio: 15 (ref 12–28)
BUN: 19 mg/dL (ref 8–27)
Bilirubin Total: 0.2 mg/dL (ref 0.0–1.2)
CO2: 24 mmol/L (ref 20–29)
Calcium: 9.1 mg/dL (ref 8.7–10.3)
Chloride: 103 mmol/L (ref 96–106)
Creatinine, Ser: 1.23 mg/dL — ABNORMAL HIGH (ref 0.57–1.00)
GFR calc Af Amer: 53 mL/min/{1.73_m2} — ABNORMAL LOW (ref >59)
GFR calc non Af Amer: 46 mL/min/{1.73_m2} — ABNORMAL LOW (ref >59)
Globulin, Total: 3.1 g/dL (ref 1.5–4.5)
Glucose: 76 mg/dL (ref 65–99)
Potassium: 4.1 mmol/L (ref 3.5–5.2)
Sodium: 142 mmol/L (ref 134–144)
Total Protein: 6.7 g/dL (ref 6.0–8.5)

## 2018-06-27 LAB — LIPID PANEL WITH LDL/HDL RATIO
Cholesterol, Total: 133 mg/dL (ref 100–199)
HDL: 63 mg/dL (ref >39)
LDL Calculated: 60 mg/dL (ref 0–99)
LDl/HDL Ratio: 1 ratio (ref 0.0–3.2)
Triglycerides: 51 mg/dL (ref 0–149)
VLDL Cholesterol Cal: 10 mg/dL (ref 5–40)

## 2018-06-27 LAB — CBC WITH DIFFERENTIAL/PLATELET
Basophils Absolute: 0 10*3/uL (ref 0.0–0.2)
Basos: 0 %
EOS (ABSOLUTE): 0.2 10*3/uL (ref 0.0–0.4)
Eos: 3 %
Hematocrit: 27.5 % — ABNORMAL LOW (ref 34.0–46.6)
Hemoglobin: 8.5 g/dL — ABNORMAL LOW (ref 11.1–15.9)
Immature Grans (Abs): 0 10*3/uL (ref 0.0–0.1)
Immature Granulocytes: 0 %
Lymphocytes Absolute: 1.7 10*3/uL (ref 0.7–3.1)
Lymphs: 27 %
MCH: 25 pg — ABNORMAL LOW (ref 26.6–33.0)
MCHC: 30.9 g/dL — ABNORMAL LOW (ref 31.5–35.7)
MCV: 81 fL (ref 79–97)
Monocytes Absolute: 0.4 10*3/uL (ref 0.1–0.9)
Monocytes: 7 %
Neutrophils Absolute: 4.1 10*3/uL (ref 1.4–7.0)
Neutrophils: 63 %
Platelets: 514 10*3/uL — ABNORMAL HIGH (ref 150–450)
RBC: 3.4 x10E6/uL — ABNORMAL LOW (ref 3.77–5.28)
RDW: 17.1 % — ABNORMAL HIGH (ref 12.3–15.4)
WBC: 6.4 10*3/uL (ref 3.4–10.8)

## 2018-06-27 LAB — HEMOGLOBIN A1C
Est. average glucose Bld gHb Est-mCnc: 120 mg/dL
Hgb A1c MFr Bld: 5.8 % — ABNORMAL HIGH (ref 4.8–5.6)

## 2018-06-30 ENCOUNTER — Ambulatory Visit (INDEPENDENT_AMBULATORY_CARE_PROVIDER_SITE_OTHER): Payer: Medicare HMO | Admitting: *Deleted

## 2018-06-30 DIAGNOSIS — R55 Syncope and collapse: Secondary | ICD-10-CM | POA: Diagnosis not present

## 2018-06-30 NOTE — Progress Notes (Signed)
[facility] Summary Report / Loop Recorder 

## 2018-07-01 ENCOUNTER — Telehealth: Payer: Self-pay | Admitting: Family Medicine

## 2018-07-01 LAB — CUP PACEART REMOTE DEVICE CHECK
Date Time Interrogation Session: 20190610203913
Implantable Pulse Generator Implant Date: 20190405

## 2018-07-01 NOTE — Telephone Encounter (Signed)
Called wanting lab results from her visit last week  Thanks teri

## 2018-07-01 NOTE — Telephone Encounter (Signed)
Could you sign off on these for me  Thanks

## 2018-07-02 ENCOUNTER — Encounter: Payer: Self-pay | Admitting: Cardiovascular Disease

## 2018-07-02 ENCOUNTER — Ambulatory Visit: Payer: Medicare HMO | Admitting: Cardiovascular Disease

## 2018-07-02 VITALS — BP 122/70 | HR 68 | Ht 63.0 in | Wt 136.0 lb

## 2018-07-02 DIAGNOSIS — R55 Syncope and collapse: Secondary | ICD-10-CM | POA: Diagnosis not present

## 2018-07-02 DIAGNOSIS — R002 Palpitations: Secondary | ICD-10-CM

## 2018-07-02 DIAGNOSIS — Z4509 Encounter for adjustment and management of other cardiac device: Secondary | ICD-10-CM

## 2018-07-02 LAB — CUP PACEART INCLINIC DEVICE CHECK
Date Time Interrogation Session: 20190717130938
Implantable Pulse Generator Implant Date: 20190405

## 2018-07-02 NOTE — Patient Instructions (Signed)
Dr Croitoru recommends that you schedule a follow-up appointment in 12 months. You will receive a reminder letter in the mail two months in advance. If you don't receive a letter, please call our office to schedule the follow-up appointment.  If you need a refill on your cardiac medications before your next appointment, please call your pharmacy. 

## 2018-07-02 NOTE — Progress Notes (Signed)
Cardiology Office Note:    Date:  07/02/2018   ID:  Julie Acevedo, DOB 1951/09/01, MRN [id number]  PCP:  Jerrol Banana., MD  Cardiologist:  Yolonda Kida, MD   Referring MD: Jerrol Banana.,*   Chief Complaint  Patient presents with  . Follow-up  . Headache  ILR follow up History of Present Illness:    Julie Acevedo is a 67 y.o. female with a hx of unexplained syncope who received an implantable loop recorder in April 2019.  She has not had any syncopal events since.  Primary cardiologist is Dr. Clayborn Bigness.  She has hypertension, type 2 diabetes, has had previous gastric bypass surgery, and has a history of back surgery for back pain.  There is a history of partial seizures.  She has not had any episodes of syncope, but has had a few episodes of dizziness.  She activated the recorder 3 times and invariably recordings showed normal sinus rhythm.  She reports occasional palpitations are watching television at rest.  She is might represent anxiety attacks.  Device has not automatically reported any arrhythmia.  The patient specifically denies any chest pain at rest exertion, dyspnea at rest or with exertion, orthopnea, paroxysmal nocturnal dyspnea, syncope, focal neurological deficits, intermittent claudication, lower extremity edema, unexplained weight gain, cough, hemoptysis or wheezing.   Past Medical History:  Diagnosis Date  . Arthritis   . Diverticulitis   . DM (diabetes mellitus) ([place])    typ e 2  . HTN (hypertension)   . Memory difficulties 09/01/2015  . Short-term memory loss     Past Surgical History:  Procedure Laterality Date  . ABDOMINAL HYSTERECTOMY     due to endometriosis-1 ovary left  . BREAST EXCISIONAL BIOPSY Right 1971   neg  . CARDIAC CATHETERIZATION  2000   no significant CAD per note of Dr Caryl Comes in 2013   . CHOLECYSTECTOMY    . CYSTOCELE REPAIR N/A 03/19/2017   Procedure: ANTERIOR REPAIR (CYSTOCELE);  Surgeon: Bjorn Loser, MD;   Location: [facility];  Service: Urology;  Laterality: N/A;  . CYSTOSCOPY N/A 03/19/2017   Procedure: CYSTOSCOPY;  Surgeon: Bjorn Loser, MD;  Location: [facility];  Service: Urology;  Laterality: N/A;  . EYE SURGERY    . FOOT SURGERY    . HAND SURGERY    . HEMICOLECTOMY    . INSERTION OF MESH N/A 08/21/2016   Procedure: INSERTION OF MESH;  Surgeon: Excell Seltzer, MD;  Location: [facility];  Service: General;  Laterality: N/A;  . LAPAROSCOPIC LYSIS OF ADHESIONS N/A 08/21/2016   Procedure: LAPAROSCOPIC LYSIS OF ADHESIONS;  Surgeon: Excell Seltzer, MD;  Location: [facility];  Service: General;  Laterality: N/A;  . LAPAROSCOPIC REMOVAL ABDOMINAL MASS    . LOOP RECORDER INSERTION N/A 03/21/2018   Procedure: LOOP RECORDER INSERTION;  Surgeon: Sanda Klein, MD;  Location: [place] [facility];  Service: Cardiovascular;  Laterality: N/A;  . LUMBAR [place]    . ROUX-EN-Y GASTRIC BYPASS  2010   Dr Hassell Done  . TONSILLECTOMY    . UPPER GI ENDOSCOPY  01/12/16   normal larynx, normal esophagus. gastric bypass with a normal-sized pouch and intact staple line, normal examined jejunum, otherwise exam was normal  . VENTRAL HERNIA REPAIR N/A 08/21/2016   Procedure: LAPAROSCOPIC VENTRAL HERNIA;  Surgeon: Excell Seltzer, MD;  Location: [facility];  Service: General;  Laterality: N/A;    Current Medications: Current Meds  Medication Sig  . ALPRAZolam (XANAX) 0.25 MG tablet  Take 1 tablet (0.25 mg total) by mouth See admin instructions. 1-2 PO 1 hour prior to flying  . folic acid (FOLVITE) 1 MG tablet Take 1 tablet (1 mg total) by mouth daily.  Marland Kitchen gabapentin (NEURONTIN) 100 MG capsule Take 2 capsules (200 mg total) by mouth 3 (three) times daily.  Marland Kitchen linaclotide (LINZESS) 290 MCG CAPS capsule Take 1 capsule (290 mcg total) by mouth daily before breakfast.  . Melatonin 10 MG TABS Take 10 mg by mouth at bedtime as needed (for sleep.).   Marland Kitchen meloxicam (MOBIC) 15 MG tablet Take 15 mg by mouth daily.   . metFORMIN (GLUCOPHAGE)  500 MG tablet Take 1 tablet (500 mg total) by mouth daily with breakfast.  . metoprolol succinate (TOPROL-XL) 25 MG 24 hr tablet Take 25 mg by mouth daily.  . ondansetron (ZOFRAN ODT) 4 MG disintegrating tablet Take 1 tablet (4 mg total) every 8 (eight) hours as needed by mouth for nausea or vomiting.  . traZODone (DESYREL) 50 MG tablet Take 1 tablet (50 mg total) by mouth at bedtime as needed for sleep.     Allergies:   Lotrel [amlodipine besy-benazepril hcl]; Contrast media [iodinated diagnostic agents]; Niacin; and Sulfa antibiotics   Social History   Socioeconomic History  . Marital status: Married    Spouse name: Not on file  . Number of children: 3  . Years of education: 29  . Highest education level: Some college, no degree  Occupational History  . Occupation: IT consultant: RETIRED  Social Needs  . Financial resource strain: Not hard at all  . Food insecurity:    Worry: Never true    Inability: Never true  . Transportation needs:    Medical: No    Non-medical: No  Tobacco Use  . Smoking status: Never Smoker  . Smokeless tobacco: Never Used  Substance and Sexual Activity  . Alcohol use: No  . Drug use: No  . Sexual activity: Never  Lifestyle  . Physical activity:    Days per week: Not on file    Minutes per session: Not on file  . Stress: To some extent  Relationships  . Social connections:    Talks on phone: Not on file    Gets together: Not on file    Attends religious service: Not on file    Active member of club or organization: Not on file    Attends meetings of clubs or organizations: Not on file    Relationship status: Not on file  Other Topics Concern  . Not on file  Social History Narrative   Lives with husband, daughter and granddaughter.;   Patient drinks about 3 cups of caffeine daily.   Patient is left handed.      Family History: The patient's family history includes Breast cancer in her paternal grandmother; Breast cancer (age of  onset: 80) in her paternal aunt; Breast cancer (age of onset: 12) in her paternal aunt; COPD in her mother; Cancer in her paternal aunt and paternal grandmother; Cancer (age of onset: 41) in her father; Congestive Heart Failure in her maternal grandmother; Coronary artery disease (age of onset: 55) in her father; Diabetes in her mother; Emphysema in her maternal grandfather; Heart attack in her paternal grandfather; Hypertension in her mother.  ROS:   Please see the history of present illness.     All other systems reviewed and are negative.  EKGs/Labs/Other Studies Reviewed:    The following studies were reviewed  today: ILR download  EKG:  EKG is not ordered today.   Recent Labs: 01/27/2018: TSH 2.340 06/26/2018: ALT 8; BUN 19; Creatinine, Ser 1.23; Hemoglobin 8.5; Platelets 514; Potassium 4.1; Sodium 142  Recent Lipid Panel    Component Value Date/Time   CHOL 133 06/26/2018 0923   CHOL 154 01/18/2014 0632   TRIG 51 06/26/2018 0923   TRIG 53 01/18/2014 0632   HDL 63 06/26/2018 0923   HDL 74 (H) 01/18/2014 0632   CHOLHDL 2.0 11/16/2012 0500   VLDL 11 01/18/2014 0632   LDLCALC 60 06/26/2018 0923   LDLCALC 69 01/18/2014 0632    Physical Exam:    VS:  BP 122/70 (BP Location: Left Arm, Patient Position: Sitting, Cuff Size: Normal)   Pulse 68   Ht 5\' 3"  (1.6 m)   Wt 136 lb (61.7 kg)   BMI 24.09 kg/m     Wt Readings from Last 3 Encounters:  07/02/18 136 lb (61.7 kg)  06/25/18 137 lb (62.1 kg)  03/25/18 135 lb (61.2 kg)     GEN:  Well nourished, well developed in no acute distress HEENT: Normal NECK: No JVD; No carotid bruits LYMPHATICS: No lymphadenopathy CARDIAC: RRR, no murmurs, rubs, gallops RESPIRATORY:  Clear to auscultation without rales, wheezing or rhonchi .  Well healed loop recorder site ABDOMEN: Soft, non-tender, non-distended MUSCULOSKELETAL:  No edema; No deformity  SKIN: Warm and dry NEUROLOGIC:  Alert and oriented x 3 PSYCHIATRIC:  Normal affect    ASSESSMENT:    1. Syncope, unspecified syncope type   2. Palpitation   3. Encounter for loop recorder check    PLAN:    In order of problems listed above:  1. Syncope: No recurrence. 2. Palpitations: The loop recorder has not registered any arrhythmia 3. ILR: Monthly downloads and yearly office follow-up.   Medication Adjustments/Labs and Tests Ordered: Current medicines are reviewed at length with the patient today.  Concerns regarding medicines are outlined above.  No orders of the defined types were placed in this encounter.  No orders of the defined types were placed in this encounter.   Patient Instructions  Dr Sallyanne Kuster recommends that you schedule a follow-up appointment in 12 months. You will receive a reminder letter in the mail two months in advance. If you don't receive a letter, please call our office to schedule the follow-up appointment.  If you need a refill on your cardiac medications before your next appointment, please call your pharmacy.    Signed, Sanda Klein, MD  07/02/2018 9:28 AM    [facility]

## 2018-07-04 NOTE — Telephone Encounter (Signed)
OV--will order then.

## 2018-07-04 NOTE — Telephone Encounter (Signed)
Pt advised.  What labs do you want to order?  Thanks,   -Mickel Baas

## 2018-07-04 NOTE — Telephone Encounter (Signed)
Pt is requesting call back for her lab results from 06/26/18. The results have been reviewed. Please advise. Thanks TNP

## 2018-07-04 NOTE — Telephone Encounter (Signed)
-----   Message from Jerrol Banana., MD sent at 07/02/2018  1:42 PM EDT ----- Still anemic--RTC in 1-2 weeks to recheck for anemia.

## 2018-07-07 NOTE — Telephone Encounter (Signed)
Pt returned call and is scheduled for OV on Thursday 07/17/18. Thanks TNP

## 2018-07-07 NOTE — Telephone Encounter (Signed)
Left message on the patient's VM that we need to make her an appointment in 2 weeks.

## 2018-07-09 ENCOUNTER — Other Ambulatory Visit: Payer: Self-pay | Admitting: Cardiovascular Disease

## 2018-07-15 ENCOUNTER — Telehealth: Payer: Self-pay | Admitting: Cardiovascular Disease

## 2018-07-15 NOTE — Telephone Encounter (Signed)
   [facility] Pre-operative Risk Assessment    Request for surgical clearance:  1. What type of surgery is being performed? Colonoscopy   2. When is this surgery scheduled? 09/05/18   3. What type of clearance is required (medical clearance vs. Pharmacy clearance to hold med vs. Both)? Cardiac clearance  4. Are there any medications that need to be held prior to surgery and how long? none   5. Practice name and name of physician performing surgery? [facility]---Dr. Watt Climes   6. What is your office phone [phone]    7.   What is your office fax [phone]  8.   Anesthesia type (None, local, MAC, general) ? none   Therisa Doyne 07/15/2018, 1:46 PM  _________________________________________________________________   (provider comments below)

## 2018-07-16 NOTE — Telephone Encounter (Signed)
[facility] enterologist RN aware pt's clearance will need to come from Dr [facility] @ [facility].

## 2018-07-16 NOTE — Telephone Encounter (Signed)
  Pt's chart has been reviewed. Her primary cardiologist is actually Dr. Clayborn Bigness [email or web address]. We only follow her loop recorder. Surgical clearance will need to come from Dr. Clayborn Bigness.  I will notify Dr. Perley Jain office and will remove from our preop pool.

## 2018-07-17 ENCOUNTER — Encounter: Payer: Self-pay | Admitting: Family Medicine

## 2018-07-17 ENCOUNTER — Ambulatory Visit (INDEPENDENT_AMBULATORY_CARE_PROVIDER_SITE_OTHER): Payer: Medicare HMO | Admitting: Family Medicine

## 2018-07-17 VITALS — BP 116/68 | HR 66 | Temp 98.8°F | Resp 16 | Wt 139.0 lb

## 2018-07-17 DIAGNOSIS — D649 Anemia, unspecified: Secondary | ICD-10-CM | POA: Diagnosis not present

## 2018-07-17 DIAGNOSIS — E118 Type 2 diabetes mellitus with unspecified complications: Secondary | ICD-10-CM

## 2018-07-17 NOTE — Progress Notes (Signed)
Patient: Julie Acevedo Female    DOB: [birth year]   67 y.o.   MRN: [id number] Visit Date: 07/17/2018  Today's Provider: [name], MD   Chief Complaint  Patient presents with  . Anemia   Subjective:    HPI Patient comes in today to follow up on Anemia. She was seen in the office 2 weeks ago and labs indicated that she was anemic. Patient does not currently take any iron supplements.   She reports that she has had occasional dizziness that lasts a few seconds while going from sitting to standing.  No vaginal bleeding or GI issues. Has colonoscopy scheduled in a few weeks.    Allergies  Allergen Reactions  . Lotrel [Amlodipine Besy-Benazepril Hcl] Anaphylaxis  . Contrast Media [Iodinated Diagnostic Agents] Swelling and Other (See Comments)    Reaction:  Neck swelling   . Niacin Hives  . Sulfa Antibiotics Hives     Current Outpatient Medications:  .  ALPRAZolam (XANAX) 0.25 MG tablet, Take 1 tablet (0.25 mg total) by mouth See admin instructions. 1-2 PO 1 hour prior to flying, Disp: 20 tablet, Rfl: 0 .  folic acid (FOLVITE) 1 MG tablet, Take 1 tablet (1 mg total) by mouth daily., Disp: 30 tablet, Rfl: 0 .  gabapentin (NEURONTIN) 100 MG capsule, Take 2 capsules (200 mg total) by mouth 3 (three) times daily., Disp: 540 capsule, Rfl: 1 .  linaclotide (LINZESS) 290 MCG CAPS capsule, Take 1 capsule (290 mcg total) by mouth daily before breakfast., Disp: 90 capsule, Rfl: 3 .  Melatonin 10 MG TABS, Take 10 mg by mouth at bedtime as needed (for sleep.). , Disp: , Rfl:  .  meloxicam (MOBIC) 15 MG tablet, Take 15 mg by mouth daily. , Disp: , Rfl:  .  metFORMIN (GLUCOPHAGE) 500 MG tablet, Take 1 tablet (500 mg total) by mouth daily with breakfast., Disp: 90 tablet, Rfl: 0 .  metoprolol succinate (TOPROL-XL) 25 MG 24 hr tablet, Take 25 mg by mouth daily., Disp: , Rfl:  .  ondansetron (ZOFRAN ODT) 4 MG disintegrating tablet, Take 1 tablet (4 mg total) every 8 (eight) hours as  needed by mouth for nausea or vomiting., Disp: 21 tablet, Rfl: 0 .  traZODone (DESYREL) 50 MG tablet, Take 1 tablet (50 mg total) by mouth at bedtime as needed for sleep., Disp: 90 tablet, Rfl: 3  Review of Systems  Constitutional: Negative for activity change, fatigue and fever.  Eyes: Negative.   Respiratory: Negative for cough and shortness of breath.   Cardiovascular: Negative for chest pain, palpitations and leg swelling.  Gastrointestinal: Negative.   Endocrine: Negative.   Genitourinary: Negative.   Musculoskeletal: Negative for arthralgias, joint swelling and myalgias.  Skin: Negative.   Allergic/Immunologic: Negative.   Neurological: Positive for dizziness and light-headedness.       Intermittent, only lasts a few seconds.   Psychiatric/Behavioral: Negative.     Social History   Tobacco Use  . Smoking status: Never Smoker  . Smokeless tobacco: Never Used  Substance Use Topics  . Alcohol use: No   Objective:   BP 116/68   Pulse 66   Temp 98.8 F (37.1 C)   Resp 16   Wt 139 lb (63 kg)   SpO2 100%   BMI 24.62 kg/m  Vitals:   07/17/18 1439  BP: 116/68  Pulse: 66  Resp: 16  Temp: 98.8 F (37.1 C)  SpO2: 100%  Weight: 139 lb (63 kg)  Physical Exam  Constitutional: She is oriented to person, place, and time. She appears well-developed and well-nourished.  HENT:  Head: Normocephalic and atraumatic.  Right Ear: External ear normal.  Left Ear: External ear normal.  Nose: Nose normal.  Eyes: Conjunctivae are normal.  Neck: No thyromegaly present.  Cardiovascular: Normal rate, regular rhythm and normal heart sounds.  Pulmonary/Chest: Effort normal and breath sounds normal.  Abdominal: Soft.  Musculoskeletal: She exhibits no edema.  Neurological: She is alert and oriented to person, place, and time.  Skin: Skin is warm and dry.  Psychiatric: She has a normal mood and affect. Her behavior is normal. Judgment and thought content normal.          Assessment & Plan:     1. Anemia, unspecified type Patient has a colonoscopy scheduled with Dr. Reece Leader at [facility]. Called and added EGD for anemia workup. Labs were also faxed to [phone] for review. Patient is to start Iron 325mg  daily. RTC in 1 month. Refer also for possible EGD. 2.s/p Gastric Bypass  130 lb weight loss 3.Prediabetes        I have done the exam and reviewed the chart and it is accurate to the best of my knowledge. Development worker, community has been used and  any errors in dictation or transcription are unintentional. Miguel Aschoff M.D. [place], MD  [facility]

## 2018-07-17 NOTE — Patient Instructions (Signed)
Start Iron 325mg  daily.

## 2018-07-29 ENCOUNTER — Telehealth: Payer: Self-pay | Admitting: Cardiology

## 2018-07-29 NOTE — Telephone Encounter (Signed)
Spoke w/ pt and requested that she send a manual transmission b/c her home monitor has not updated in at least 14 days.   

## 2018-07-31 ENCOUNTER — Telehealth: Payer: Self-pay | Admitting: Family Medicine

## 2018-07-31 ENCOUNTER — Ambulatory Visit (INDEPENDENT_AMBULATORY_CARE_PROVIDER_SITE_OTHER): Payer: Medicare HMO | Admitting: *Deleted

## 2018-07-31 DIAGNOSIS — R55 Syncope and collapse: Secondary | ICD-10-CM

## 2018-07-31 NOTE — Telephone Encounter (Signed)
Pt called saying she seen an advertisement about stem cell treatment for pain.  She wants to know if it is safe  Pt' s CB# is [phone]  Thanks teri

## 2018-07-31 NOTE — Telephone Encounter (Signed)
LMTCB ED 

## 2018-08-01 IMAGING — MG 2D DIGITAL DIAGNOSTIC BILATERAL MAMMOGRAM WITH CAD AND ADJUNCT T
8 of 20 series · 8 of 40 positions shown · non-contrast
Comparison: Previous exams.

CLINICAL DATA: 65-year-old female with an area of pain involving
the outer left breast.

EXAM:
2D DIGITAL DIAGNOSTIC BILATERAL MAMMOGRAM WITH CAD AND ADJUNCT TOMO
LEFT BREAST ULTRASOUND

[R ML (1 of 2)]
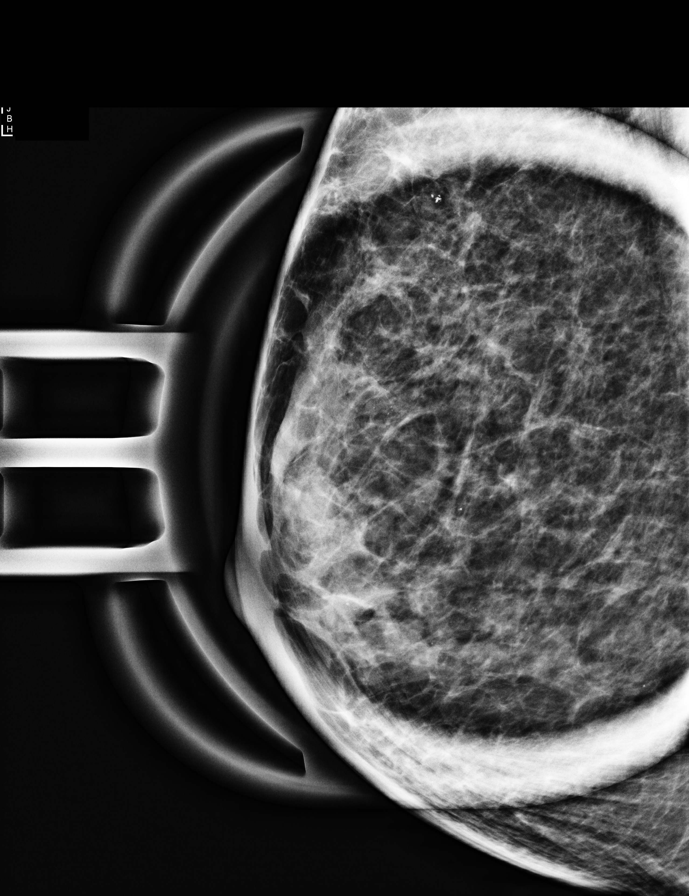

[R ML (2 of 2)]
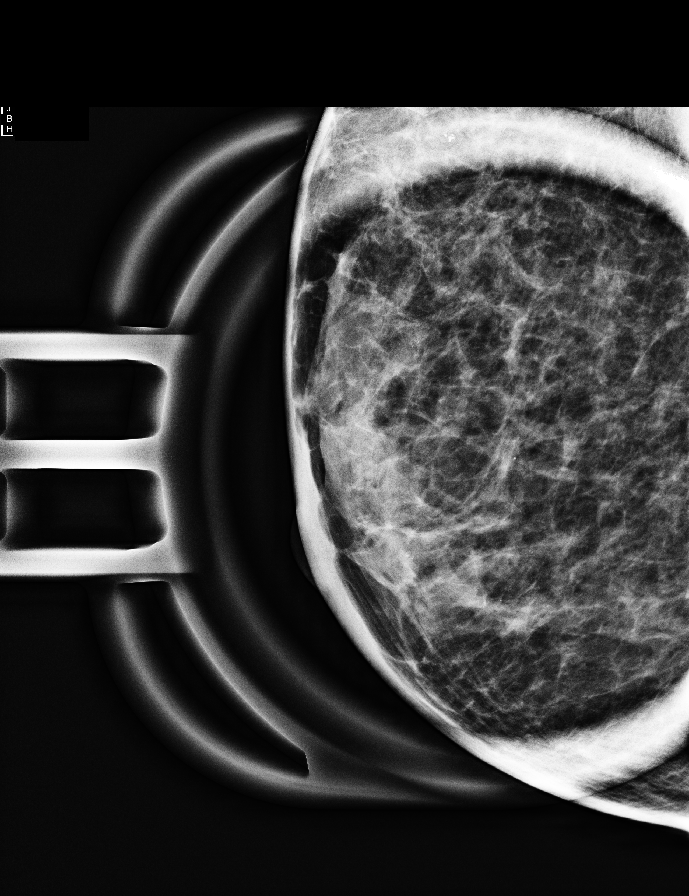

[R CC]
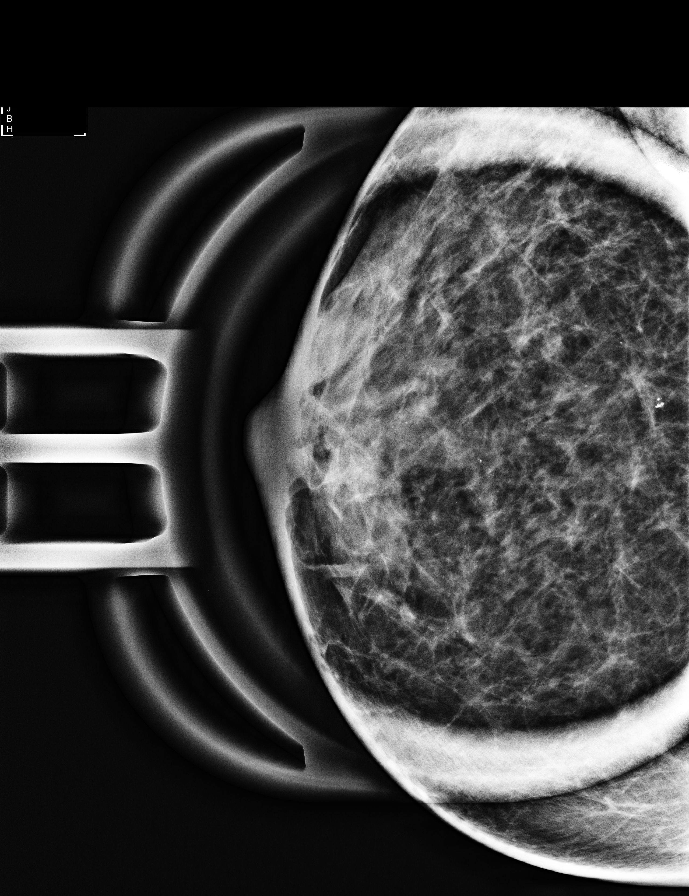

[R CC synth-2D]
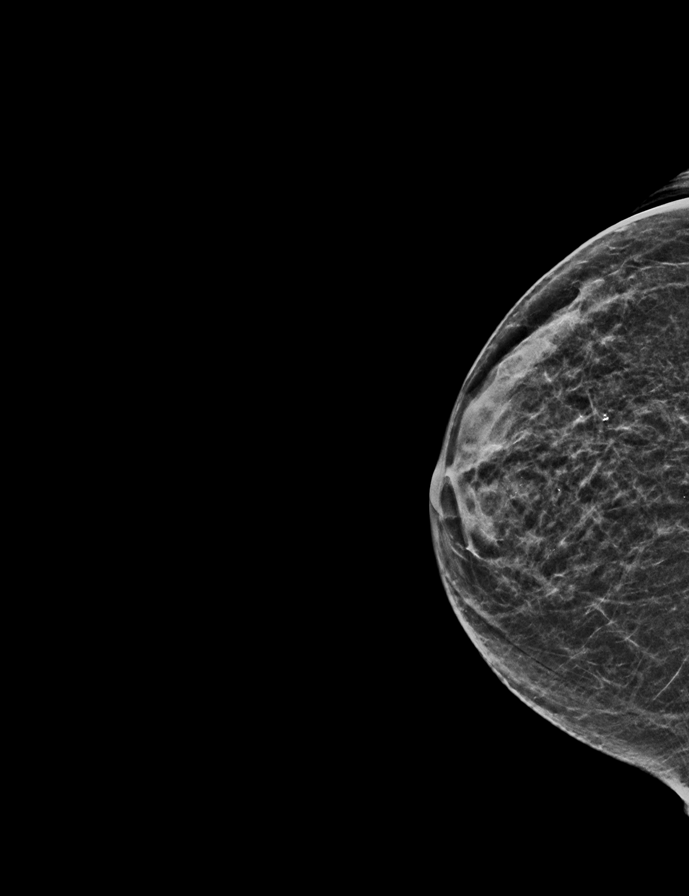

[L CC synth-2D]
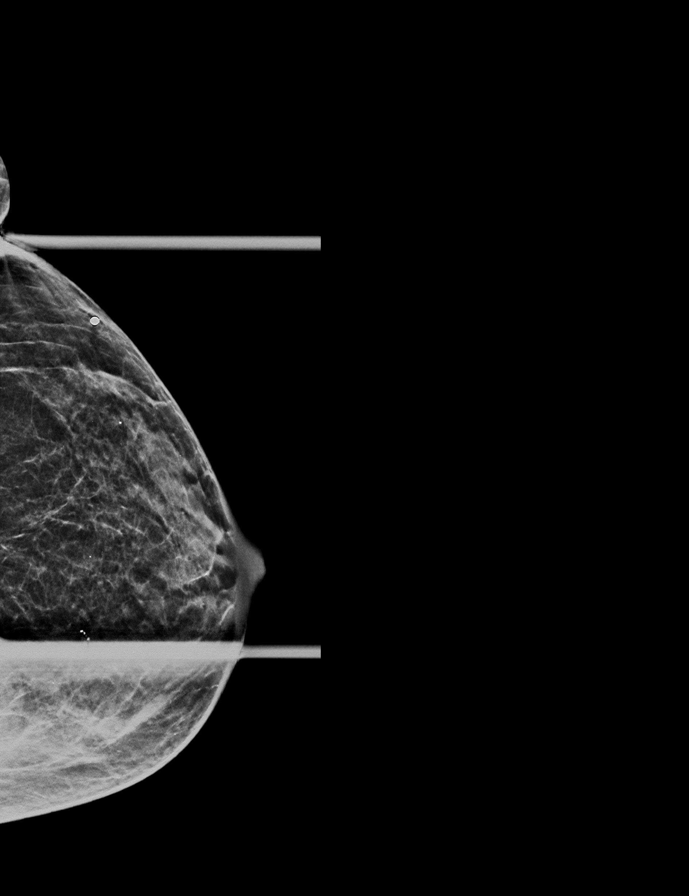

[R MLO]
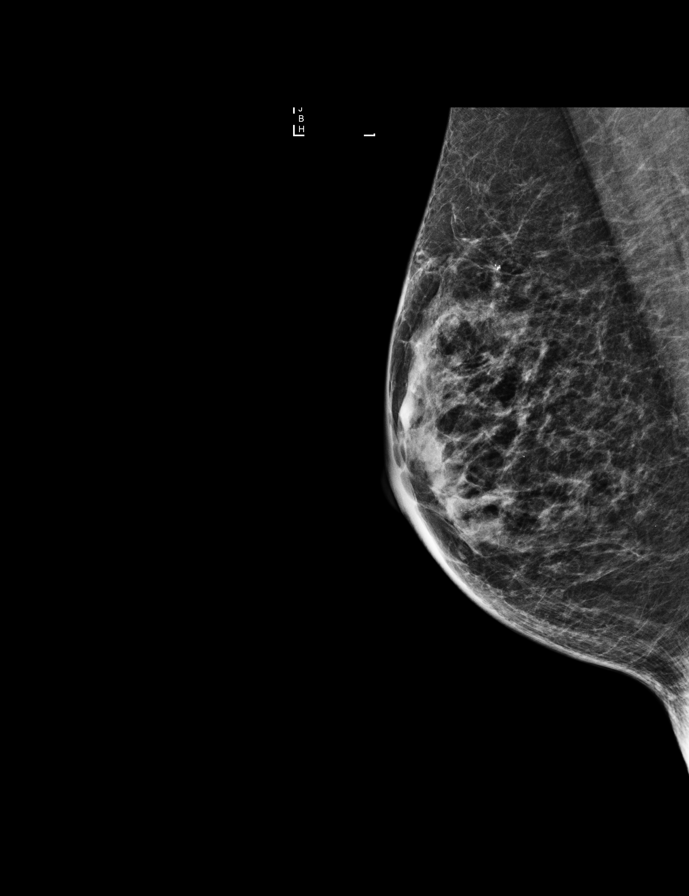

[L MLO]
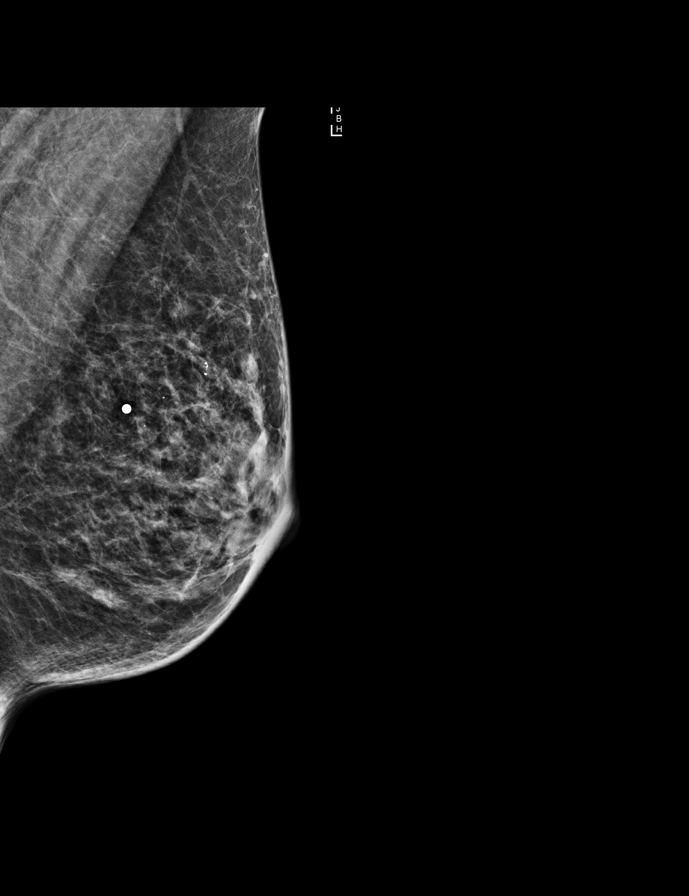

[R MLO synth-2D]
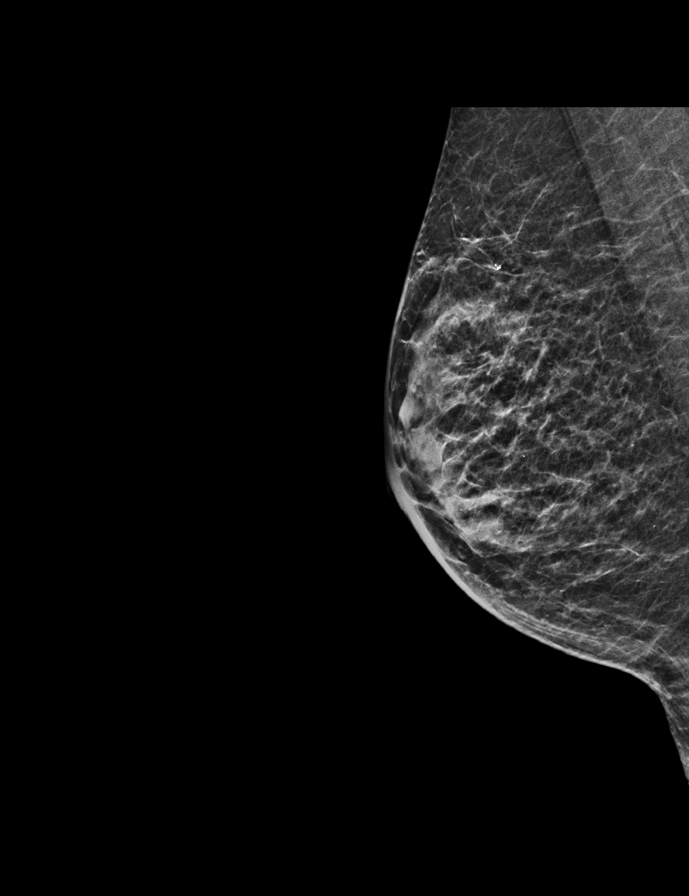

[8 of 40 positions shown; findings below may reference images not displayed]

ACR Breast Density Category b: There are scattered areas of
fibroglandular density.
FINDINGS: No suspicious masses or calcifications are seen in either breast.
Spot compression magnification views of the slightly upper anterior
right breast demonstrate loosely grouped punctate and round
calcifications. Spot compression CC tomograms over the palpable area
of concern in the outer left breast does not reveal any suspicious
masses or abnormalities.

Mammographic images were processed with CAD.

Physical examination of the outer left breast reveals focal
tenderness at the approximate 2 to 3 o'clock position.

Targeted ultrasound of the left breast was performed. No suspicious
masses or abnormalities are seen, only heterogeneous fibroglandular
tissue is visualized.
IMPRESSION: 1. No mammographic or sonographic findings to account for the pain
in the outer left breast.

2.  Probably benign right breast calcifications.

RECOMMENDATION:
Diagnostic mammography of the right breast in 6 months with
magnification views to demonstrate stability of the probably benign
right breast calcifications.

I have discussed the findings and recommendations with the patient.
Results were also provided in writing at the conclusion of the
visit. If applicable, a reminder letter will be sent to the patient
regarding the next appointment.

BI-RADS CATEGORY  3: Probably benign.

## 2018-08-01 IMAGING — US ULTRASOUND LEFT BREAST LIMITED
1 series · 4 of 4 positions shown · non-contrast
Comparison: Previous exams.

CLINICAL DATA: 65-year-old female with an area of pain involving
the outer left breast.

EXAM:
2D DIGITAL DIAGNOSTIC BILATERAL MAMMOGRAM WITH CAD AND ADJUNCT TOMO
LEFT BREAST ULTRASOUND

[Series 1: ultrasound left breast limited · 0.03mm/px · 4 of 4 slices shown]
[im 1/4]
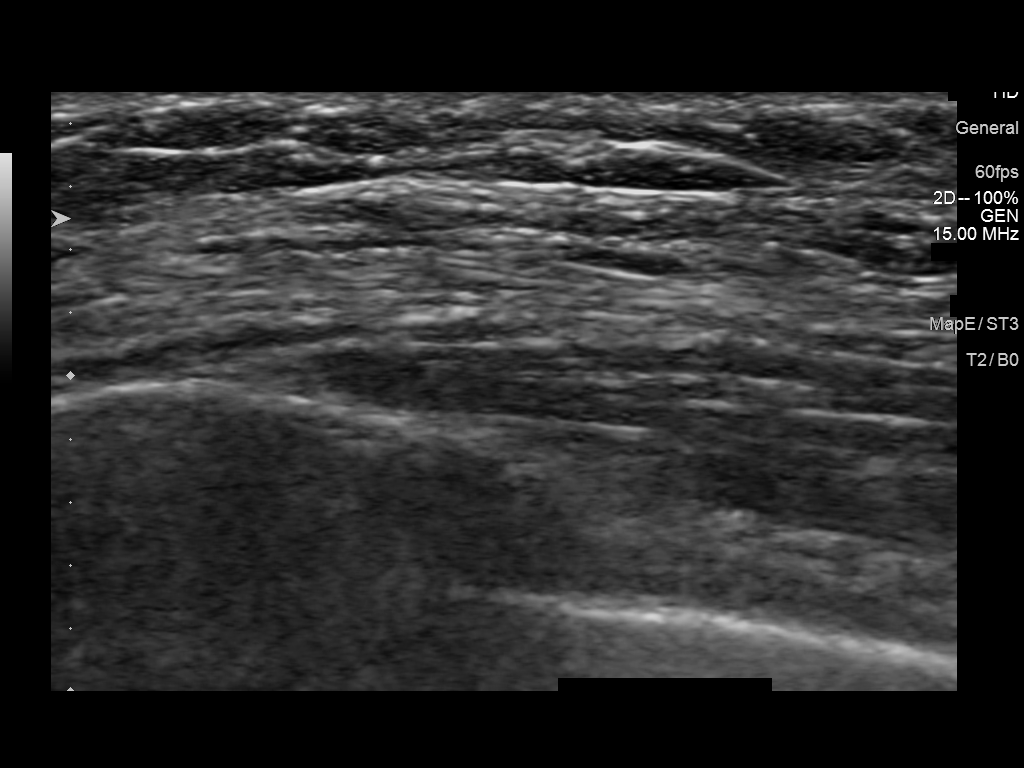
[im 2/4]
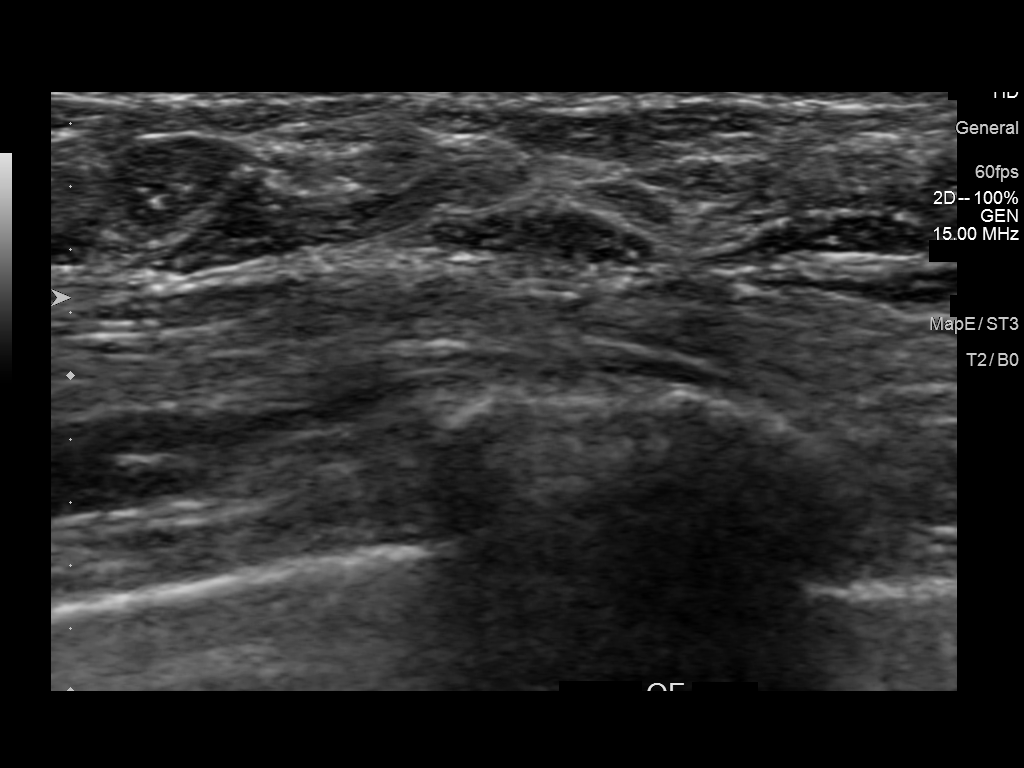
[im 3/4]
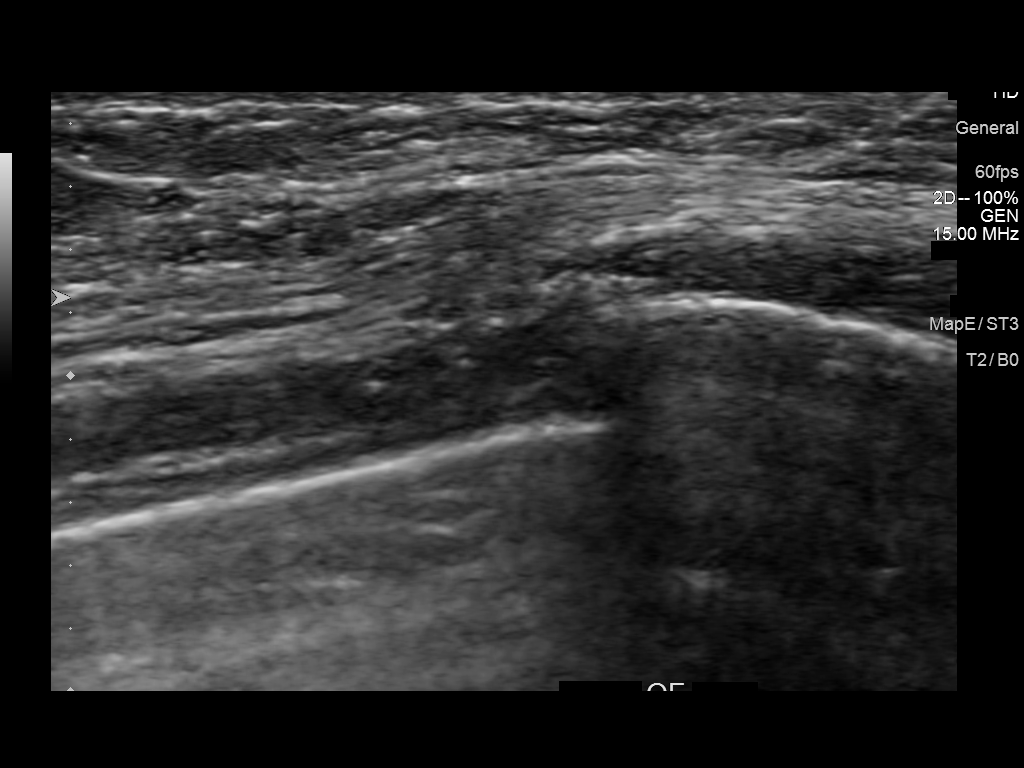
[im 4/4]
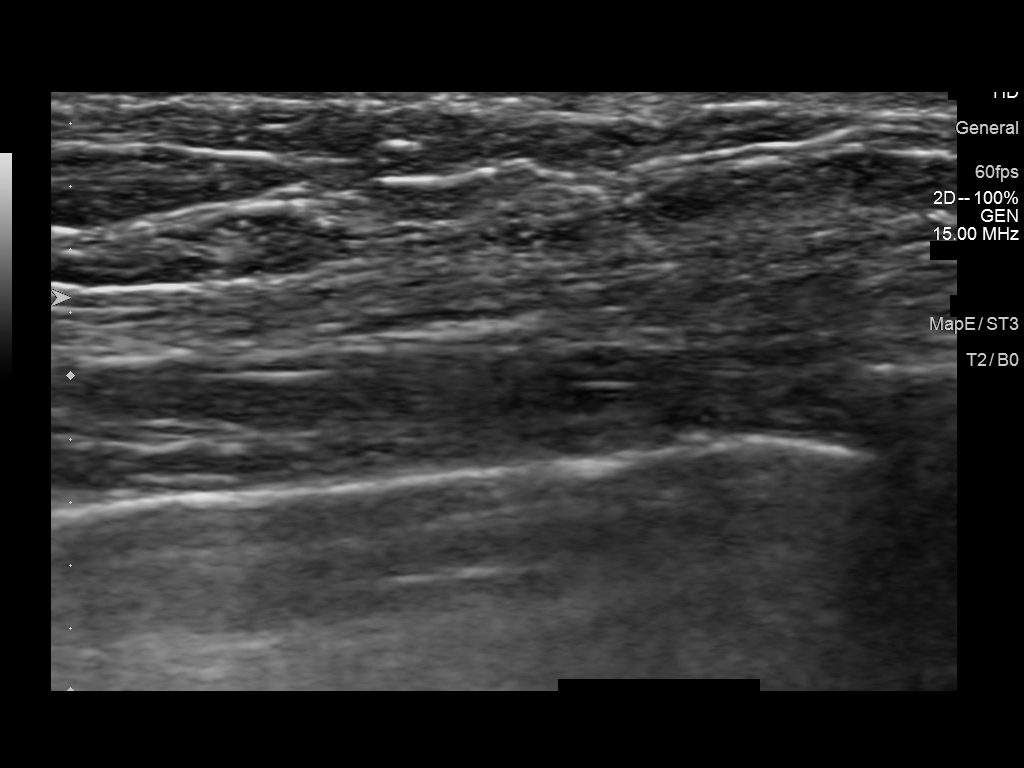

[4 of 4 positions shown; findings below may reference images not displayed]

ACR Breast Density Category b: There are scattered areas of
fibroglandular density.
FINDINGS: No suspicious masses or calcifications are seen in either breast.
Spot compression magnification views of the slightly upper anterior
right breast demonstrate loosely grouped punctate and round
calcifications. Spot compression CC tomograms over the palpable area
of concern in the outer left breast does not reveal any suspicious
masses or abnormalities.

Mammographic images were processed with CAD.

Physical examination of the outer left breast reveals focal
tenderness at the approximate 2 to 3 o'clock position.

Targeted ultrasound of the left breast was performed. No suspicious
masses or abnormalities are seen, only heterogeneous fibroglandular
tissue is visualized.
IMPRESSION: 1. No mammographic or sonographic findings to account for the pain
in the outer left breast.

2.  Probably benign right breast calcifications.

RECOMMENDATION:
Diagnostic mammography of the right breast in 6 months with
magnification views to demonstrate stability of the probably benign
right breast calcifications.

I have discussed the findings and recommendations with the patient.
Results were also provided in writing at the conclusion of the
visit. If applicable, a reminder letter will be sent to the patient
regarding the next appointment.

BI-RADS CATEGORY  3: Probably benign.

## 2018-08-01 NOTE — Progress Notes (Signed)
[facility] Summary Report / Loop Recorder 

## 2018-08-01 NOTE — Telephone Encounter (Signed)
Patient is returning Elena's call. CB# [phone]

## 2018-08-01 NOTE — Telephone Encounter (Signed)
Advised  ED 

## 2018-08-05 ENCOUNTER — Ambulatory Visit (INDEPENDENT_AMBULATORY_CARE_PROVIDER_SITE_OTHER): Payer: Medicare HMO | Admitting: Family Medicine

## 2018-08-05 VITALS — BP 117/52 | HR 52 | Temp 98.6°F | Resp 16 | Wt 143.0 lb

## 2018-08-05 DIAGNOSIS — I10 Essential (primary) hypertension: Secondary | ICD-10-CM | POA: Diagnosis not present

## 2018-08-05 DIAGNOSIS — E118 Type 2 diabetes mellitus with unspecified complications: Secondary | ICD-10-CM

## 2018-08-05 DIAGNOSIS — M1990 Unspecified osteoarthritis, unspecified site: Secondary | ICD-10-CM | POA: Diagnosis not present

## 2018-08-05 DIAGNOSIS — F4323 Adjustment disorder with mixed anxiety and depressed mood: Secondary | ICD-10-CM

## 2018-08-05 NOTE — Progress Notes (Signed)
Patient: Julie Acevedo Female    DOB: 10/27/1951   67 y.o.   MRN: [id number] Visit Date: 08/05/2018  Today's Provider: [name], MD   Chief Complaint  Patient presents with  . Cyst   Subjective:    HPI Patient reports that she found a knot on the right side of her rib cage yesterday evening. She denies any tenderness or swelling. The knot has not changed in color or size.      Allergies  Allergen Reactions  . Lotrel [Amlodipine Besy-Benazepril Hcl] Anaphylaxis  . Contrast Media [Iodinated Diagnostic Agents] Swelling and Other (See Comments)    Reaction:  Neck swelling   . Niacin Hives  . Sulfa Antibiotics Hives     Current Outpatient Medications:  .  ALPRAZolam (XANAX) 0.25 MG tablet, Take 1 tablet (0.25 mg total) by mouth See admin instructions. 1-2 PO 1 hour prior to flying, Disp: 20 tablet, Rfl: 0 .  folic acid (FOLVITE) 1 MG tablet, Take 1 tablet (1 mg total) by mouth daily., Disp: 30 tablet, Rfl: 0 .  gabapentin (NEURONTIN) 100 MG capsule, Take 2 capsules (200 mg total) by mouth 3 (three) times daily., Disp: 540 capsule, Rfl: 1 .  linaclotide (LINZESS) 290 MCG CAPS capsule, Take 1 capsule (290 mcg total) by mouth daily before breakfast., Disp: 90 capsule, Rfl: 3 .  Melatonin 10 MG TABS, Take 10 mg by mouth at bedtime as needed (for sleep.). , Disp: , Rfl:  .  meloxicam (MOBIC) 15 MG tablet, Take 15 mg by mouth daily. , Disp: , Rfl:  .  metFORMIN (GLUCOPHAGE) 500 MG tablet, Take 1 tablet (500 mg total) by mouth daily with breakfast., Disp: 90 tablet, Rfl: 0 .  metoprolol succinate (TOPROL-XL) 25 MG 24 hr tablet, Take 25 mg by mouth daily., Disp: , Rfl:  .  ondansetron (ZOFRAN ODT) 4 MG disintegrating tablet, Take 1 tablet (4 mg total) every 8 (eight) hours as needed by mouth for nausea or vomiting., Disp: 21 tablet, Rfl: 0 .  traZODone (DESYREL) 50 MG tablet, Take 1 tablet (50 mg total) by mouth at bedtime as needed for sleep., Disp: 90 tablet, Rfl:  3  Review of Systems  Constitutional: Negative for activity change, diaphoresis and fever.  HENT: Negative.   Eyes: Negative.   Respiratory: Negative.   Cardiovascular: Negative.   Endocrine: Negative.   Musculoskeletal: Positive for arthralgias, back pain and myalgias.  Skin: Negative for color change, pallor, rash and wound.  Allergic/Immunologic: Negative.   Hematological: Negative.   Psychiatric/Behavioral: Negative.     Social History   Tobacco Use  . Smoking status: Never Smoker  . Smokeless tobacco: Never Used  Substance Use Topics  . Alcohol use: No   Objective:   BP (!) 117/52 (BP Location: Right Arm, Patient Position: Sitting, Cuff Size: Large) Comment: electronic cuff  Pulse (!) 52   Temp 98.6 F (37 C)   Resp 16   Wt 143 lb (64.9 kg)   BMI 25.33 kg/m  Vitals:   08/05/18 1631  BP: (!) 117/52  Pulse: (!) 52  Resp: 16  Temp: 98.6 F (37 C)  Weight: 143 lb (64.9 kg)     Physical Exam  Constitutional: She is oriented to person, place, and time. She appears well-developed and well-nourished.  HENT:  Head: Normocephalic and atraumatic.  Right Ear: External ear normal.  Left Ear: External ear normal.  Nose: Nose normal.  Eyes: Conjunctivae are normal. No scleral  icterus.  Neck: No thyromegaly present.  Cardiovascular: Normal rate, regular rhythm and normal heart sounds.  Pulmonary/Chest: Effort normal.  Abdominal: Soft.  Musculoskeletal: She exhibits no edema.  Normal exam of ribs. Lower ribs is what pt has been feeling.  Neurological: She is alert and oriented to person, place, and time.  Skin: Skin is warm and dry.  Psychiatric: She has a normal mood and affect. Her behavior is normal. Judgment and thought content normal.        Assessment & Plan:      Anxiety HTN TIIDM OA  I have done the exam and reviewed the chart and it is accurate to the best of my knowledge. Development worker, community has been used and  any errors in dictation or  transcription are unintentional. Miguel Aschoff M.D. [place], MD  [place]

## 2018-08-12 LAB — CUP PACEART REMOTE DEVICE CHECK
Date Time Interrogation Session: 20190713213934
Implantable Pulse Generator Implant Date: 20190405

## 2018-09-02 ENCOUNTER — Ambulatory Visit (INDEPENDENT_AMBULATORY_CARE_PROVIDER_SITE_OTHER): Payer: Medicare HMO | Admitting: *Deleted

## 2018-09-02 DIAGNOSIS — R55 Syncope and collapse: Secondary | ICD-10-CM | POA: Diagnosis not present

## 2018-09-03 NOTE — Progress Notes (Signed)
[facility] Summary Report / Loop Recorder 

## 2018-09-05 DIAGNOSIS — Z8371 Family history of colonic polyps: Secondary | ICD-10-CM | POA: Diagnosis not present

## 2018-09-05 DIAGNOSIS — Z1211 Encounter for screening for malignant neoplasm of colon: Secondary | ICD-10-CM | POA: Diagnosis not present

## 2018-09-05 DIAGNOSIS — Z980 Intestinal bypass and anastomosis status: Secondary | ICD-10-CM | POA: Diagnosis not present

## 2018-09-05 DIAGNOSIS — K5730 Diverticulosis of large intestine without perforation or abscess without bleeding: Secondary | ICD-10-CM | POA: Diagnosis not present

## 2018-09-05 LAB — HM COLONOSCOPY

## 2018-09-06 ENCOUNTER — Other Ambulatory Visit: Payer: Self-pay | Admitting: Family Medicine

## 2018-09-06 DIAGNOSIS — G4700 Insomnia, unspecified: Secondary | ICD-10-CM

## 2018-09-08 LAB — CUP PACEART REMOTE DEVICE CHECK
Date Time Interrogation Session: 20190815220807
Implantable Pulse Generator Implant Date: 20190405

## 2018-09-14 LAB — CUP PACEART REMOTE DEVICE CHECK
Date Time Interrogation Session: 20190917223803
Implantable Pulse Generator Implant Date: 20190405

## 2018-09-15 ENCOUNTER — Ambulatory Visit: Payer: Self-pay | Admitting: Family Medicine
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
## 2018-10-02 IMAGING — DX DG FOREARM 2V*R*
2 series · 2 of 2 positions shown · non-contrast
Comparison: 06/19/2011

CLINICAL DATA: Snake bite to right forearm 1 hour ago.

EXAM:
RIGHT FOREARM - 2 VIEW

[forearm ap]
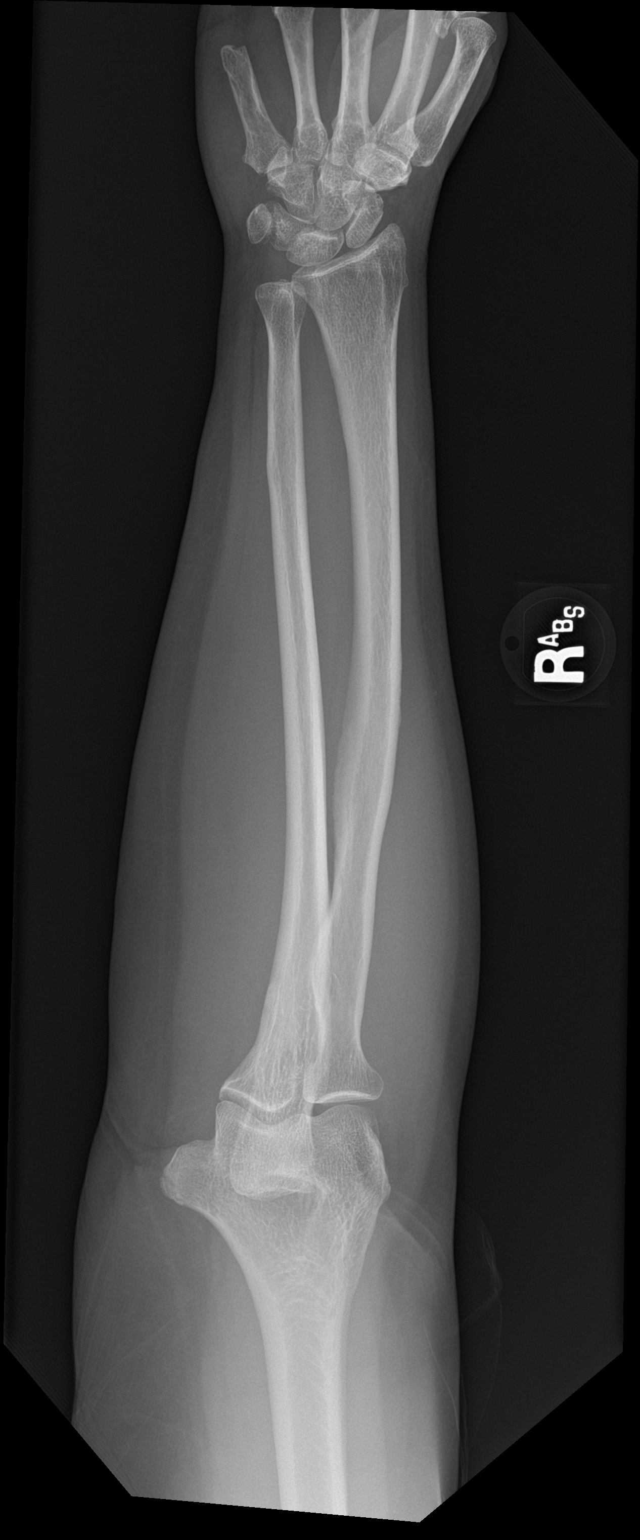

[forearm lat]
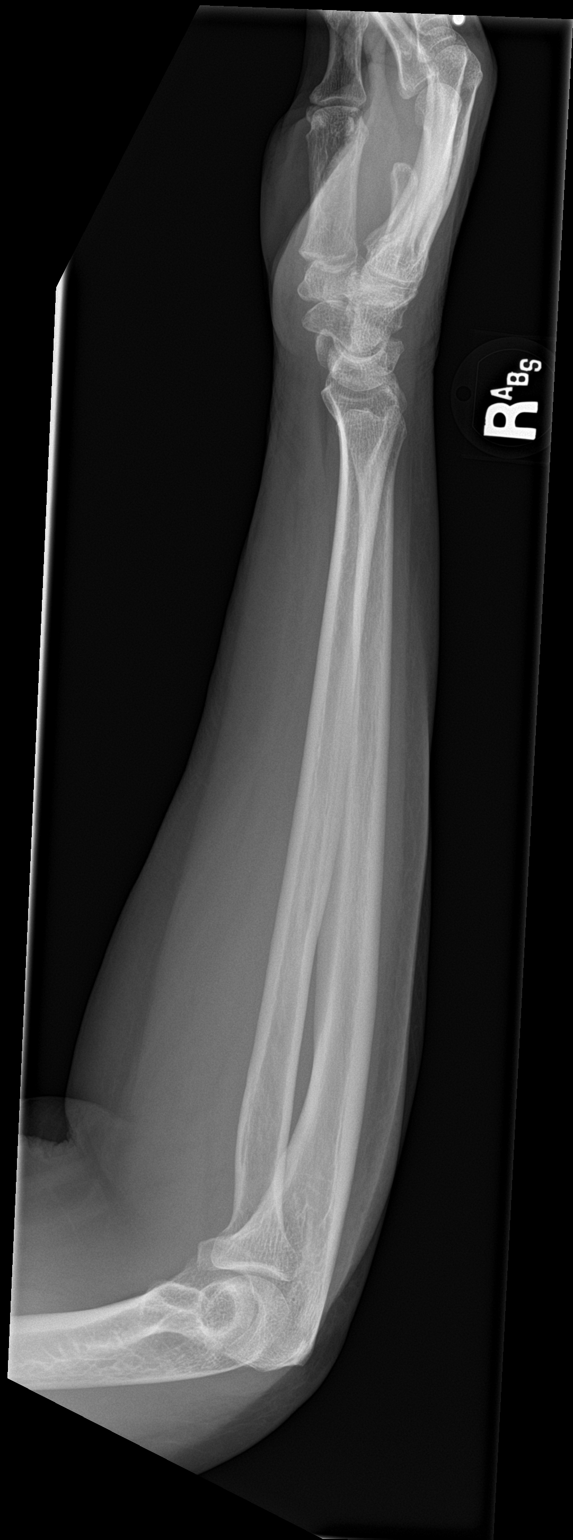

[2 of 2 positions shown; findings below may reference images not displayed]

FINDINGS: The patient is status post fifth ray amputation at the level of the
distal metacarpal bone. No acute fractures or dislocations
identified. No radiopaque foreign bodies or soft tissue
calcifications.
IMPRESSION: 1. No acute findings.

## 2018-10-06 ENCOUNTER — Other Ambulatory Visit: Payer: Self-pay | Admitting: Family Medicine

## 2018-10-06 ENCOUNTER — Ambulatory Visit (INDEPENDENT_AMBULATORY_CARE_PROVIDER_SITE_OTHER): Payer: Medicare HMO | Admitting: *Deleted

## 2018-10-06 DIAGNOSIS — R921 Mammographic calcification found on diagnostic imaging of breast: Secondary | ICD-10-CM

## 2018-10-06 DIAGNOSIS — R55 Syncope and collapse: Secondary | ICD-10-CM

## 2018-10-06 DIAGNOSIS — R928 Other abnormal and inconclusive findings on diagnostic imaging of breast: Secondary | ICD-10-CM

## 2018-10-06 NOTE — Progress Notes (Signed)
[facility] Summary Report / Loop Recorder 

## 2018-10-08 DIAGNOSIS — R079 Chest pain, unspecified: Secondary | ICD-10-CM | POA: Diagnosis not present

## 2018-10-08 DIAGNOSIS — E119 Type 2 diabetes mellitus without complications: Secondary | ICD-10-CM | POA: Diagnosis not present

## 2018-10-08 DIAGNOSIS — R0602 Shortness of breath: Secondary | ICD-10-CM | POA: Diagnosis not present

## 2018-10-08 DIAGNOSIS — I208 Other forms of angina pectoris: Secondary | ICD-10-CM | POA: Diagnosis not present

## 2018-10-08 DIAGNOSIS — I10 Essential (primary) hypertension: Secondary | ICD-10-CM | POA: Diagnosis not present

## 2018-10-08 DIAGNOSIS — E6609 Other obesity due to excess calories: Secondary | ICD-10-CM | POA: Diagnosis not present

## 2018-10-13 ENCOUNTER — Emergency Department
Admission: EM | Admit: 2018-10-13 | Discharge: 2018-10-14 | Disposition: A | Discharge status: Home / Self Care | Payer: Medicare HMO | Attending: Emergency Medicine | Admitting: Emergency Medicine

## 2018-10-13 ENCOUNTER — Emergency Department: Payer: Medicare HMO

## 2018-10-13 ENCOUNTER — Encounter: Payer: Self-pay | Admitting: Cardiology

## 2018-10-13 ENCOUNTER — Other Ambulatory Visit: Payer: Self-pay

## 2018-10-13 DIAGNOSIS — Z5321 Procedure and treatment not carried out due to patient leaving prior to being seen by health care provider: Secondary | ICD-10-CM | POA: Insufficient documentation

## 2018-10-13 DIAGNOSIS — R079 Chest pain, unspecified: Secondary | ICD-10-CM | POA: Diagnosis not present

## 2018-10-13 LAB — CBC
HCT: 28.9 % — ABNORMAL LOW (ref 36.0–46.0)
Hemoglobin: 8.8 g/dL — ABNORMAL LOW (ref 12.0–15.0)
MCH: 23.6 pg — ABNORMAL LOW (ref 26.0–34.0)
MCHC: 30.4 g/dL (ref 30.0–36.0)
MCV: 77.5 fL — ABNORMAL LOW (ref 80.0–100.0)
Platelets: 401 10*3/uL — ABNORMAL HIGH (ref 150–400)
RBC: 3.73 MIL/uL — ABNORMAL LOW (ref 3.87–5.11)
RDW: 16.6 % — ABNORMAL HIGH (ref 11.5–15.5)
WBC: 7.9 10*3/uL (ref 4.0–10.5)
nRBC: 0 % (ref 0.0–0.2)

## 2018-10-13 LAB — BASIC METABOLIC PANEL
Anion gap: 9 (ref 5–15)
BUN: 24 mg/dL — ABNORMAL HIGH (ref 8–23)
CO2: 25 mmol/L (ref 22–32)
Calcium: 8.7 mg/dL — ABNORMAL LOW (ref 8.9–10.3)
Chloride: 107 mmol/L (ref 98–111)
Creatinine, Ser: 1.48 mg/dL — ABNORMAL HIGH (ref 0.44–1.00)
GFR calc Af Amer: 42 mL/min — ABNORMAL LOW (ref >60)
GFR calc non Af Amer: 36 mL/min — ABNORMAL LOW (ref >60)
Glucose, Bld: 136 mg/dL — ABNORMAL HIGH (ref 70–99)
Potassium: 4.5 mmol/L (ref 3.5–5.1)
Sodium: 141 mmol/L (ref 135–145)

## 2018-10-13 LAB — TROPONIN I: Troponin I: <0.03 ng/mL (ref <0.03)

## 2018-10-13 NOTE — ED Triage Notes (Signed)
Patient to triage via wheelchair by EMS from home for chest pain.  Patient took Asa 325mg  prior to EMS arrival.  Reports 12-lead with sinus brady.  Reports history of chest pain and wearing holter monitor.

## 2018-10-13 NOTE — ED Notes (Signed)
EMS /lobby , chest pain x2 days ,

## 2018-10-14 ENCOUNTER — Telehealth: Payer: Self-pay | Admitting: Emergency Medicine

## 2018-10-14 NOTE — ED Triage Notes (Signed)
Pt called from [facility] to treatment room, no response 

## 2018-10-14 NOTE — ED Triage Notes (Signed)
Pt called from [facility] to treatment room, no response ?

## 2018-10-14 NOTE — Telephone Encounter (Signed)
Called patient due to lwot to inquire about condition and follow up plans. She has not informed her pcp about her chest pain.  Says she is to have tests for heart blockage next week. I asked her to please call her doctor to inform of her chest pain. I also told her they can review her labs.  I explained that this pain could still be her heart.  She agrees to call her pcp.

## 2018-10-17 ENCOUNTER — Telehealth: Payer: Self-pay

## 2018-10-17 NOTE — Telephone Encounter (Signed)
Pt called because she went to the ER a couple of days ago. She feel like a dull pain it comes and goes. Pt sent a manual transmission and wants to talk with the nurse. I let her talk to the device tech nurse.

## 2018-10-17 NOTE — Telephone Encounter (Signed)
Spoke with pt regarding dull pain in chest informed her that there were no episodes on her LINQ advised pt to call [facility] clinic as she was recently seen there for the same symptoms. Pt voiced understanding.

## 2018-10-18 ENCOUNTER — Other Ambulatory Visit: Payer: Self-pay | Admitting: Family Medicine

## 2018-10-24 DIAGNOSIS — R0602 Shortness of breath: Secondary | ICD-10-CM | POA: Diagnosis not present

## 2018-10-24 DIAGNOSIS — I208 Other forms of angina pectoris: Secondary | ICD-10-CM | POA: Diagnosis not present

## 2018-10-24 LAB — CUP PACEART REMOTE DEVICE CHECK
Date Time Interrogation Session: 20191020233939
Implantable Pulse Generator Implant Date: 20190405

## 2018-10-28 ENCOUNTER — Ambulatory Visit: Payer: Self-pay | Admitting: Family Medicine

## 2018-10-30 DIAGNOSIS — I208 Other forms of angina pectoris: Secondary | ICD-10-CM | POA: Diagnosis not present

## 2018-10-30 DIAGNOSIS — R0602 Shortness of breath: Secondary | ICD-10-CM | POA: Diagnosis not present

## 2018-10-30 DIAGNOSIS — D649 Anemia, unspecified: Secondary | ICD-10-CM | POA: Diagnosis not present

## 2018-10-30 DIAGNOSIS — R079 Chest pain, unspecified: Secondary | ICD-10-CM | POA: Diagnosis not present

## 2018-10-30 DIAGNOSIS — G9340 Encephalopathy, unspecified: Secondary | ICD-10-CM | POA: Diagnosis not present

## 2018-10-30 DIAGNOSIS — I10 Essential (primary) hypertension: Secondary | ICD-10-CM | POA: Diagnosis not present

## 2018-10-30 DIAGNOSIS — E119 Type 2 diabetes mellitus without complications: Secondary | ICD-10-CM | POA: Diagnosis not present

## 2018-10-30 DIAGNOSIS — E6609 Other obesity due to excess calories: Secondary | ICD-10-CM | POA: Diagnosis not present

## 2018-11-06 ENCOUNTER — Ambulatory Visit
Admission: RE | Admit: 2018-11-06 | Discharge: 2018-11-06 | Disposition: A | Discharge status: Home / Self Care | Payer: Medicare HMO | Source: Ambulatory Visit | Attending: Family Medicine | Admitting: Family Medicine

## 2018-11-06 DIAGNOSIS — R921 Mammographic calcification found on diagnostic imaging of breast: Secondary | ICD-10-CM | POA: Insufficient documentation

## 2018-11-06 DIAGNOSIS — R928 Other abnormal and inconclusive findings on diagnostic imaging of breast: Secondary | ICD-10-CM | POA: Insufficient documentation

## 2018-11-07 ENCOUNTER — Ambulatory Visit (INDEPENDENT_AMBULATORY_CARE_PROVIDER_SITE_OTHER): Payer: Medicare HMO

## 2018-11-07 ENCOUNTER — Telehealth: Payer: Self-pay

## 2018-11-07 DIAGNOSIS — R55 Syncope and collapse: Secondary | ICD-10-CM | POA: Diagnosis not present

## 2018-11-07 NOTE — Telephone Encounter (Signed)
Spoke w/ pt and requested that she send a manual transmission b/c her home monitor has not updated in at least 14 days.   

## 2018-11-10 NOTE — Progress Notes (Signed)
[facility] Summary Report / Loop Recorder 

## 2018-11-15 ENCOUNTER — Other Ambulatory Visit: Payer: Self-pay | Admitting: Family Medicine

## 2018-11-15 DIAGNOSIS — M5416 Radiculopathy, lumbar region: Secondary | ICD-10-CM

## 2018-11-16 NOTE — Progress Notes (Signed)
Julie Acevedo  [phone] [phone]) [phone]  ID: Julie Acevedo OB: 05/27/1951  MR#: [id number]  TJQ#:[id number]  Patient Care Team: Jerrol Banana., MD as PCP - General (Family Medicine) Yolonda Kida, MD as PCP - Cardiology (Cardiology) Lorelee Cover., MD as Consulting Physician (Ophthalmology) Bjorn Loser, MD as Consulting Physician (Urology) Bary Castilla Forest Gleason, MD as Consulting Physician (General Surgery)  CHIEF COMPLAINT: Anemia, unspecified.  INTERVAL HISTORY: Patient is a 67 year old female who was noted to have a declining hemoglobin on routine blood work.  She has noted increasing weakness and fatigue.  She has no neurologic complaints.  She denies any recent fevers or illnesses.  She has a fair appetite, but denies weight loss.  She has no chest pain, cough, hemoptysis, or shortness of breath.  She denies any nausea, vomiting, constipation, or diarrhea.  She has no melena or hematochezia.  She has no urinary complaints.  Patient otherwise feels well and offers no further specific complaints today.  REVIEW OF SYSTEMS:   Review of Systems  Constitutional: Positive for malaise/fatigue. Negative for fever and weight loss.  Respiratory: Negative.  Negative for cough and shortness of breath.   Cardiovascular: Negative.  Negative for chest pain and leg swelling.  Gastrointestinal: Negative.  Negative for abdominal pain, blood in stool and melena.  Genitourinary: Negative.  Negative for hematuria.  Musculoskeletal: Negative.  Negative for back pain.  Skin: Negative.  Negative for rash.  Neurological: Negative for dizziness, focal weakness, weakness and headaches.  Psychiatric/Behavioral: Negative.  The patient is not nervous/anxious.     As per HPI. Otherwise, a complete review of systems is negative.  PAST MEDICAL HISTORY: Past Medical History:  Diagnosis Date  . Arthritis   . Diverticulitis   . DM (diabetes mellitus) ([place])    typ e 2  . HTN (hypertension)   . Memory difficulties 09/01/2015  . Short-term memory loss     PAST SURGICAL HISTORY: Past Surgical History:  Procedure Laterality Date  . ABDOMINAL HYSTERECTOMY     due to endometriosis-1 ovary left  . BREAST EXCISIONAL BIOPSY Right 1971   neg  . CARDIAC CATHETERIZATION  2000   no significant CAD per note of Dr Caryl Comes in 2013   . CHOLECYSTECTOMY    . CYSTOCELE REPAIR N/A 03/19/2017   Procedure: ANTERIOR REPAIR (CYSTOCELE);  Surgeon: Bjorn Loser, MD;  Location: [facility];  Service: Urology;  Laterality: N/A;  . CYSTOSCOPY N/A 03/19/2017   Procedure: CYSTOSCOPY;  Surgeon: Bjorn Loser, MD;  Location: [facility];  Service: Urology;  Laterality: N/A;  . EYE SURGERY    . FOOT SURGERY    . HAND SURGERY    . HEMICOLECTOMY    . INSERTION OF MESH N/A 08/21/2016   Procedure: INSERTION OF MESH;  Surgeon: Excell Seltzer, MD;  Location: [facility];  Service: General;  Laterality: N/A;  . LAPAROSCOPIC LYSIS OF ADHESIONS N/A 08/21/2016   Procedure: LAPAROSCOPIC LYSIS OF ADHESIONS;  Surgeon: Excell Seltzer, MD;  Location: [facility];  Service: General;  Laterality: N/A;  . LAPAROSCOPIC REMOVAL ABDOMINAL MASS    . LOOP RECORDER INSERTION N/A 03/21/2018   Procedure: LOOP RECORDER INSERTION;  Surgeon: Sanda Klein, MD;  Location: [facility];  Service: Cardiovascular;  Laterality: N/A;  . LUMBAR [place]    . ROUX-EN-Y GASTRIC BYPASS  2010   Dr Hassell Done  . TONSILLECTOMY    . UPPER GI ENDOSCOPY  01/12/16   normal larynx, normal esophagus. gastric bypass with a  normal-sized pouch and intact staple line, normal examined jejunum, otherwise exam was normal  . VENTRAL HERNIA REPAIR N/A 08/21/2016   Procedure: LAPAROSCOPIC VENTRAL HERNIA;  Surgeon: Excell Seltzer, MD;  Location: [facility];  Service: General;  Laterality: N/A;    FAMILY HISTORY: Family History  Problem Relation Age of Onset  . Cancer Father 47       Lung  . Coronary artery disease Father 22  . COPD  Mother   . Diabetes Mother   . Hypertension Mother   . Cancer Paternal Grandmother        Breast  . Breast cancer Paternal Grandmother   . Congestive Heart Failure Maternal Grandmother   . Emphysema Maternal Grandfather   . Heart attack Paternal Grandfather   . Cancer Paternal Aunt        Breast  . Breast cancer Paternal Aunt 76  . Breast cancer Paternal Aunt 32    ADVANCED DIRECTIVES (Y/N):  N  HEALTH MAINTENANCE: Social History   Tobacco Use  . Smoking status: Never Smoker  . Smokeless tobacco: Never Used  Substance Use Topics  . Alcohol use: No  . Drug use: No     Colonoscopy:  PAP:  Bone density:  Lipid panel:  Allergies  Allergen Reactions  . Lotrel [Amlodipine Besy-Benazepril Hcl] Anaphylaxis  . Contrast Media [Iodinated Diagnostic Agents] Swelling and Other (See Comments)    Reaction:  Neck swelling   . Niacin Hives  . Sulfa Antibiotics Hives    Current Outpatient Medications  Medication Sig Dispense Refill  . Elastic Bandages & Supports (COMPRESSION & SUPPORT GLOVES L) MISC     . folic acid (FOLVITE) 1 MG tablet Take 1 tablet (1 mg total) by mouth daily. 30 tablet 0  . gabapentin (NEURONTIN) 100 MG capsule TAKE 2 CAPSULES (200 MG TOTAL) BY MOUTH 3 (THREE) TIMES DAILY. 540 capsule 1  . hydrochlorothiazide (HYDRODIURIL) 25 MG tablet TAKE 1 TABLET (25 MG TOTAL) BY MOUTH DAILY.  3  . linaclotide (LINZESS) 290 MCG CAPS capsule Take 1 capsule (290 mcg total) by mouth daily before breakfast. 90 capsule 3  . Melatonin 10 MG TABS Take 10 mg by mouth at bedtime as needed (for sleep.).     Marland Kitchen meloxicam (MOBIC) 15 MG tablet Take 15 mg by mouth daily.     . metFORMIN (GLUCOPHAGE) 500 MG tablet Take 1 tablet (500 mg total) by mouth daily with breakfast. 90 tablet 0  . metoprolol succinate (TOPROL-XL) 25 MG 24 hr tablet Take 25 mg by mouth daily.     No current facility-administered medications for this visit.     OBJECTIVE: Vitals:   11/18/18 1322  BP: 138/82    Pulse: 78  Temp: (!) 97.3 F (36.3 C)     Body mass index is 27.53 kg/m.    ECOG FS:0 - Asymptomatic  General: Well-developed, well-nourished, no acute distress. Eyes: Pink conjunctiva, anicteric sclera. HEENT: Normocephalic, moist mucous membranes, clear oropharnyx. Lungs: Clear to auscultation bilaterally. Heart: Regular rate and rhythm. No rubs, murmurs, or gallops. Abdomen: Soft, nontender, nondistended. No organomegaly noted, normoactive bowel sounds. Musculoskeletal: No edema, cyanosis, or clubbing. Neuro: Alert, answering all questions appropriately. Cranial nerves grossly intact. Skin: No rashes or petechiae noted. Psych: Normal affect. Lymphatics: No cervical, calvicular, axillary or inguinal LAD.   LAB RESULTS:  Lab Results  Component Value Date   NA 141 10/13/2018   K 4.5 10/13/2018   CL 107 10/13/2018   CO2 25 10/13/2018   GLUCOSE 136 (  H) 10/13/2018   BUN 24 (H) 10/13/2018   CREATININE 1.48 (H) 10/13/2018   CALCIUM 8.7 (L) 10/13/2018   PROT 6.7 06/26/2018   ALBUMIN 3.6 06/26/2018   AST 11 06/26/2018   ALT 8 06/26/2018   ALKPHOS 150 (H) 06/26/2018   BILITOT 0.2 06/26/2018   GFRNONAA 36 (L) 10/13/2018   GFRAA 42 (L) 10/13/2018    Lab Results  Component Value Date   WBC 9.1 11/18/2018   NEUTROABS 4.1 06/26/2018   HGB 8.4 (L) 11/18/2018   HCT 28.2 (L) 11/18/2018   MCV 77.5 (L) 11/18/2018   PLT 390 11/18/2018    Lab Results  Component Value Date   IRON 13 (L) 11/18/2018   TIBC 401 11/18/2018   IRONPCTSAT 3 (L) 11/18/2018   Lab Results  Component Value Date   FERRITIN 7 (L) 11/18/2018    STUDIES: Mm Diag Breast Tomo Bilateral  Result Date: 11/06/2018 CLINICAL DATA:  Annual examination and 2 year follow-up of right breast calcifications. EXAM: DIGITAL DIAGNOSTIC BILATERAL MAMMOGRAM WITH CAD AND TOMO COMPARISON:  Previous exam(s). ACR Breast Density Category c: The breast tissue is heterogeneously dense, which may obscure small masses. FINDINGS:  Loop recorder in the upper inner left breast posteriorly. No mass, architectural distortion, or suspicious microcalcification is identified either breast to suggest malignancy. Magnification views retroareolar right breast show loosely grouped and scattered benign-appearing calcifications significant change. Mammographic images were processed with CAD. IMPRESSION: No evidence of malignancy in either breast. Benign-appearing calcifications in the right breast are without significant change. RECOMMENDATION: Screening mammogram in one year.(Code:SM-B-01Y) I have discussed the findings and recommendations with the patient. Results were also provided in writing at the conclusion of the visit. If applicable, a reminder letter will be sent to the patient regarding the next appointment. BI-RADS CATEGORY  2: Benign. Electronically Signed   By: Curlene Dolphin M.D.   On: 11/06/2018 10:22    ASSESSMENT: Anemia, unspecified.  PLAN:   1. Anemia, unspecified: Patient's hemoglobin and iron stores are significantly decreased.  All of her other laboratory work including hemolysis labs, B12, folate, and SPEP are all negative or within normal limits.  Her reticulocyte count is inappropriately decreased.  She has appropriately increased erythropoietin level. Patient had a colonoscopy on September 05, 2018 that was reported as normal.  Return to clinic in 3 weeks for further evaluation and initiation of IV therapy  I spent a total of 45 minutes face-to-face with the patient of which greater than 50% of the visit was spent in counseling and coordination of care as detailed above.  Patient expressed understanding and was in agreement with this plan. She also understands that She can call clinic at any time with any questions, concerns, or complaints.    Lloyd Huger, MD   11/21/2018 9:50 AM

## 2018-11-17 ENCOUNTER — Encounter: Payer: Self-pay | Admitting: Obstetrics and Gynecology

## 2018-11-17 ENCOUNTER — Ambulatory Visit: Payer: Medicare HMO | Admitting: Obstetrics and Gynecology

## 2018-11-17 VITALS — BP 114/60 | HR 59 | Ht 63.0 in | Wt 154.0 lb

## 2018-11-17 DIAGNOSIS — L02214 Cutaneous abscess of groin: Secondary | ICD-10-CM | POA: Diagnosis not present

## 2018-11-17 NOTE — Progress Notes (Signed)
Julie Acevedo., MD   Chief Complaint  Patient presents with  . vaginal knot    outside vag area, knot on right, little sore felt it this past saturday     HPI:      Julie Acevedo is a 67 y.o. G4P3 who LMP was No LMP recorded. Patient has had a hysterectomy., presents today for NP>3 yrs knot in RT inguinal area since Sat. Area is tender but not painful. Noticed with washing. No drainage, fevers. No vag sx. Had similar nodule in axilla in past but never vaginally. Pt is not sex active. Pt is s/p hyst.    Past Medical History:  Diagnosis Date  . Arthritis   . Diverticulitis   . DM (diabetes mellitus) ([place])    typ e 2  . HTN (hypertension)   . Memory difficulties 09/01/2015  . Short-term memory loss     Past Surgical History:  Procedure Laterality Date  . ABDOMINAL HYSTERECTOMY     due to endometriosis-1 ovary left  . BREAST EXCISIONAL BIOPSY Right 1971   neg  . CARDIAC CATHETERIZATION  2000   no significant CAD per note of Dr Caryl Comes in 2013   . CHOLECYSTECTOMY    . CYSTOCELE REPAIR N/A 03/19/2017   Procedure: ANTERIOR REPAIR (CYSTOCELE);  Surgeon: Bjorn Loser, MD;  [facility];  Service: Urology;  Laterality: N/A;  . CYSTOSCOPY N/A 03/19/2017   Procedure: CYSTOSCOPY;  Surgeon: Bjorn Loser, MD;  Location[facility];  Service: Urology;  Laterality: N/A;  . EYE SURGERY    . FOOT SURGERY    . HAND SURGERY    . HEMICOLECTOMY    . INSERTION OF MESH N/A 08/21/2016   Procedure: INSERTION OF MESH;  Surgeon: Excell Seltzer, MD;  Location[facility];  Service: General;  Laterality: N/A;  . LAPAROSCOPIC LYSIS OF ADHESIONS N/A 08/21/2016   Procedure: LAPAROSCOPIC LYSIS OF ADHESIONS;  Surgeon: Excell Seltzer, MD;  Location[facility];  Service: General;  Laterality: N/A;  . LAPAROSCOPIC REMOVAL ABDOMINAL MASS    . LOOP RECORDER INSERTION N/A 03/21/2018   Procedure: LOOP RECORDER INSERTION;  Surgeon: Sanda Klein, MD;  Location: [facility];  Service:  Cardiovascular;  Laterality: N/A;  . LUMBAR [place]    . ROUX-EN-Y GASTRIC BYPASS  2010   Dr Hassell Done  . TONSILLECTOMY    . UPPER GI ENDOSCOPY  01/12/16   normal larynx, normal esophagus. gastric bypass with a normal-sized pouch and intact staple line, normal examined jejunum, otherwise exam was normal  . VENTRAL HERNIA REPAIR N/A 08/21/2016   Procedure: LAPAROSCOPIC VENTRAL HERNIA;  Surgeon: Excell Seltzer, MD;  Location: [facility];  Service: General;  Laterality: N/A;    Family History  Problem Relation Age of Onset  . Cancer Father 75       Lung  . Coronary artery disease Father 31  . COPD Mother   . Diabetes Mother   . Hypertension Mother   . Cancer Paternal Grandmother        Breast  . Breast cancer Paternal Grandmother   . Congestive Heart Failure Maternal Grandmother   . Emphysema Maternal Grandfather   . Heart attack Paternal Grandfather   . Cancer Paternal Aunt        Breast  . Breast cancer Paternal Aunt 37  . Breast cancer Paternal Aunt 4    Social History   Socioeconomic History  . Marital status: Married    Spouse name: Not on file  .  Number of children: 3  . Years of education: 64  . Highest education level: Some college, no degree  Occupational History  . Occupation: IT consultant: RETIRED  Social Needs  . Financial resource strain: Not hard at all  . Food insecurity:    Worry: Never true    Inability: Never true  . Transportation needs:    Medical: No    Non-medical: No  Tobacco Use  . Smoking status: Never Smoker  . Smokeless tobacco: Never Used  Substance and Sexual Activity  . Alcohol use: No  . Drug use: No  . Sexual activity: Never  Lifestyle  . Physical activity:    Days per week: Not on file    Minutes per session: Not on file  . Stress: To some extent  Relationships  . Social connections:    Talks on phone: Not on file    Gets together: Not on file    Attends religious service: Not on file    Active member of club or  organization: Not on file    Attends meetings of clubs or organizations: Not on file    Relationship status: Not on file  . Intimate partner violence:    Fear of current or ex partner: Not on file    Emotionally abused: Not on file    Physically abused: Not on file    Forced sexual activity: Not on file  Other Topics Concern  . Not on file  Social History Narrative   Lives with husband, daughter and granddaughter.;   Patient drinks about 3 cups of caffeine daily.   Patient is left handed.     Outpatient Medications Prior to Visit  Medication Sig Dispense Refill  . Elastic Bandages & Supports (COMPRESSION & SUPPORT GLOVES L) MISC     . folic acid (FOLVITE) 1 MG tablet Take 1 tablet (1 mg total) by mouth daily. 30 tablet 0  . gabapentin (NEURONTIN) 100 MG capsule Take 2 capsules (200 mg total) by mouth 3 (three) times daily. 540 capsule 1  . hydrochlorothiazide (HYDRODIURIL) 25 MG tablet TAKE 1 TABLET (25 MG TOTAL) BY MOUTH DAILY.  3  . linaclotide (LINZESS) 290 MCG CAPS capsule Take 1 capsule (290 mcg total) by mouth daily before breakfast. 90 capsule 3  . Melatonin 10 MG TABS Take 10 mg by mouth at bedtime as needed (for sleep.).     Marland Kitchen meloxicam (MOBIC) 15 MG tablet Take 15 mg by mouth daily.     . metFORMIN (GLUCOPHAGE) 500 MG tablet Take 1 tablet (500 mg total) by mouth daily with breakfast. 90 tablet 0  . metoprolol succinate (TOPROL-XL) 25 MG 24 hr tablet Take 25 mg by mouth daily.    Marland Kitchen ALPRAZolam (XANAX) 0.25 MG tablet Take 1 tablet (0.25 mg total) by mouth See admin instructions. 1-2 PO 1 hour prior to flying 20 tablet 0  . metFORMIN (GLUCOPHAGE) 500 MG tablet TAKE 1 TABLET (500 MG TOTAL) BY MOUTH 2 (TWO) TIMES DAILY WITH A MEAL. 180 tablet 3  . ondansetron (ZOFRAN ODT) 4 MG disintegrating tablet Take 1 tablet (4 mg total) every 8 (eight) hours as needed by mouth for nausea or vomiting. 21 tablet 0  . traZODone (DESYREL) 50 MG tablet TAKE 1 TABLET (50 MG TOTAL) BY MOUTH AT BEDTIME  AS NEEDED FOR SLEEP. 90 tablet 3   No facility-administered medications prior to visit.       ROS:  Review of Systems  Constitutional: Negative for  fatigue, fever and unexpected weight change.  Respiratory: Negative for cough, shortness of breath and wheezing.   Cardiovascular: Negative for chest pain, palpitations and leg swelling.  Gastrointestinal: Negative for blood in stool, constipation, diarrhea, nausea and vomiting.  Endocrine: Negative for cold intolerance, heat intolerance and polyuria.  Genitourinary: Negative for dyspareunia, dysuria, flank pain, frequency, genital sores, hematuria, menstrual problem, pelvic pain, urgency, vaginal bleeding, vaginal discharge and vaginal pain.  Musculoskeletal: Negative for back pain, joint swelling and myalgias.  Skin: Positive for wound. Negative for rash.  Neurological: Negative for dizziness, syncope, light-headedness, numbness and headaches.  Hematological: Negative for adenopathy.  Psychiatric/Behavioral: Negative for agitation, confusion, sleep disturbance and suicidal ideas. The patient is not nervous/anxious.      OBJECTIVE:   Vitals:  BP 114/60   Pulse (!) 59   Ht 5\' 3"  (1.6 m)   Wt 154 lb (69.9 kg)   BMI 27.28 kg/m   Physical Exam  Constitutional: She is oriented to person, place, and time. She appears well-developed.  Neck: Normal range of motion.  Pulmonary/Chest: Effort normal.  Genitourinary:     Genitourinary Comments: ~1 CM FIRM, ERYTHEMATOUS NODULE RT ING AREA; NO D/C, C/W SMALL ABSCESS; NO PLACE TO I&D  Musculoskeletal: Normal range of motion.  Neurological: She is alert and oriented to person, place, and time. No cranial nerve deficit.  Psychiatric: She has a normal mood and affect. Her behavior is normal. Judgment and thought content normal.  Vitals reviewed.   Assessment/Plan: Soft tissue abscess of inguinal region - Warm compresses/go without underwear to prevent rubbing. F/u if sx worsen for abx but  no need for them currently.     Return if symptoms worsen or fail to improve.  Etta Gassett B. Bethenny Losee, PA-C 11/17/2018 3:05 PM

## 2018-11-17 NOTE — Patient Instructions (Signed)
I value your feedback and entrusting us with your care. If you get a [facility] patient survey, I would appreciate you taking the time to let us know about your experience today. Thank you! 

## 2018-11-18 ENCOUNTER — Other Ambulatory Visit: Payer: Self-pay | Admitting: *Deleted

## 2018-11-18 ENCOUNTER — Other Ambulatory Visit: Payer: Self-pay

## 2018-11-18 ENCOUNTER — Inpatient Hospital Stay: Payer: Medicare HMO

## 2018-11-18 ENCOUNTER — Inpatient Hospital Stay: Payer: Medicare HMO | Attending: Oncology | Admitting: Oncology

## 2018-11-18 VITALS — BP 138/82 | HR 78 | Temp 97.3°F | Ht 63.0 in | Wt 155.4 lb

## 2018-11-18 DIAGNOSIS — E119 Type 2 diabetes mellitus without complications: Secondary | ICD-10-CM

## 2018-11-18 DIAGNOSIS — R531 Weakness: Secondary | ICD-10-CM | POA: Diagnosis not present

## 2018-11-18 DIAGNOSIS — R5383 Other fatigue: Secondary | ICD-10-CM | POA: Insufficient documentation

## 2018-11-18 DIAGNOSIS — D649 Anemia, unspecified: Secondary | ICD-10-CM | POA: Diagnosis not present

## 2018-11-18 DIAGNOSIS — I10 Essential (primary) hypertension: Secondary | ICD-10-CM | POA: Diagnosis not present

## 2018-11-18 DIAGNOSIS — D509 Iron deficiency anemia, unspecified: Secondary | ICD-10-CM | POA: Diagnosis not present

## 2018-11-18 LAB — RETICULOCYTES
Immature Retic Fract: 15.8 % (ref 2.3–15.9)
RBC.: 3.64 MIL/uL — ABNORMAL LOW (ref 3.87–5.11)
Retic Count, Absolute: 40 10*3/uL (ref 19.0–186.0)
Retic Ct Pct: 1.1 % (ref 0.4–3.1)

## 2018-11-18 LAB — CBC
HCT: 28.2 % — ABNORMAL LOW (ref 36.0–46.0)
Hemoglobin: 8.4 g/dL — ABNORMAL LOW (ref 12.0–15.0)
MCH: 23.1 pg — ABNORMAL LOW (ref 26.0–34.0)
MCHC: 29.8 g/dL — ABNORMAL LOW (ref 30.0–36.0)
MCV: 77.5 fL — ABNORMAL LOW (ref 80.0–100.0)
Platelets: 390 10*3/uL (ref 150–400)
RBC: 3.64 MIL/uL — ABNORMAL LOW (ref 3.87–5.11)
RDW: 17.1 % — ABNORMAL HIGH (ref 11.5–15.5)
WBC: 9.1 10*3/uL (ref 4.0–10.5)
nRBC: 0 % (ref 0.0–0.2)

## 2018-11-18 LAB — IRON AND TIBC
Iron: 13 ug/dL — ABNORMAL LOW (ref 28–170)
Saturation Ratios: 3 % — ABNORMAL LOW (ref 10.4–31.8)
TIBC: 401 ug/dL (ref 250–450)
UIBC: 388 ug/dL

## 2018-11-18 LAB — FERRITIN: Ferritin: 7 ng/mL — ABNORMAL LOW (ref 11–307)

## 2018-11-18 LAB — VITAMIN B12: Vitamin B-12: 330 pg/mL (ref 180–914)

## 2018-11-18 LAB — FOLATE: Folate: 17.6 ng/mL (ref >5.9)

## 2018-11-18 LAB — LACTATE DEHYDROGENASE: LDH: 128 U/L (ref 98–192)

## 2018-11-18 NOTE — Progress Notes (Signed)
Patient is here as a new patient in reference to her anemia. Patient stated that she has had weakness, fatigued on exertion, nausea, vomiting, fever and chills. Patient denied having issues with her bowel movements and shortness of breathe.

## 2018-11-19 LAB — PROTEIN ELECTROPHORESIS, SERUM
A/G Ratio: 0.9 (ref 0.7–1.7)
Albumin ELP: 3.2 g/dL (ref 2.9–4.4)
Alpha-1-Globulin: 0.3 g/dL (ref 0.0–0.4)
Alpha-2-Globulin: 1 g/dL (ref 0.4–1.0)
Beta Globulin: 1 g/dL (ref 0.7–1.3)
Gamma Globulin: 1.3 g/dL (ref 0.4–1.8)
Globulin, Total: 3.6 g/dL (ref 2.2–3.9)
Total Protein ELP: 6.8 g/dL (ref 6.0–8.5)

## 2018-11-19 LAB — HAPTOGLOBIN: Haptoglobin: 312 mg/dL — ABNORMAL HIGH (ref 34–200)

## 2018-11-19 LAB — ERYTHROPOIETIN: Erythropoietin: 26.7 m[IU]/mL — ABNORMAL HIGH (ref 2.6–18.5)

## 2018-11-21 DIAGNOSIS — D509 Iron deficiency anemia, unspecified: Secondary | ICD-10-CM | POA: Insufficient documentation

## 2018-11-23 ENCOUNTER — Other Ambulatory Visit: Payer: Self-pay | Admitting: Family Medicine

## 2018-11-23 DIAGNOSIS — I10 Essential (primary) hypertension: Secondary | ICD-10-CM

## 2018-11-28 LAB — DAT, POLYSPECIFIC AHG (ARMC ONLY): Polyspecific AHG test: NEGATIVE

## 2018-12-01 ENCOUNTER — Other Ambulatory Visit: Payer: Self-pay | Admitting: Family Medicine

## 2018-12-01 ENCOUNTER — Telehealth: Payer: Self-pay | Admitting: Cardiology

## 2018-12-01 NOTE — Telephone Encounter (Signed)
Xanax 0.5 mg 1 hour before flight--#5 pills

## 2018-12-01 NOTE — Telephone Encounter (Signed)
Pt is flying out of town on Sunday,  Dec 22.  Pt is asking for something to relax her flying out and flying home - 2 pills.  Please advise.  Thanks, [employer]

## 2018-12-01 NOTE — Telephone Encounter (Signed)
Please review. Thanks!  

## 2018-12-01 NOTE — Telephone Encounter (Signed)
Spoke w/ pt and instructed her to call tech support to help her trouble shoot her home monitor. Pt verbalized understanding.

## 2018-12-02 MED ORDER — ALPRAZOLAM 0.5 MG PO TABS: 0.5000 mg | ORAL_TABLET | Freq: Every evening | ORAL | 0 refills | Status: DC | PRN

## 2018-12-03 ENCOUNTER — Telehealth: Payer: Self-pay

## 2018-12-03 DIAGNOSIS — E042 Nontoxic multinodular goiter: Secondary | ICD-10-CM | POA: Diagnosis not present

## 2018-12-03 NOTE — Telephone Encounter (Signed)
Spoke with pt and reminded pt of remote transmission that is due today. Pt verbalized understanding.  Pt called [employer] and they are sending her a new monitor.

## 2018-12-08 ENCOUNTER — Ambulatory Visit: Payer: Medicare HMO | Admitting: Oncology

## 2018-12-08 ENCOUNTER — Telehealth: Payer: Self-pay | Admitting: Family Medicine

## 2018-12-08 NOTE — Telephone Encounter (Signed)
[facility]  called stating there is a gap in care for [name] and to please call in a statin drug for her.

## 2018-12-08 NOTE — Telephone Encounter (Signed)
Patient's cholesterol levels were normal in 06/2018 with a LDL of 60, and has been for the last 3 yrs. Please review. Thanks!

## 2018-12-11 ENCOUNTER — Ambulatory Visit (INDEPENDENT_AMBULATORY_CARE_PROVIDER_SITE_OTHER): Payer: Medicare HMO

## 2018-12-11 DIAGNOSIS — R55 Syncope and collapse: Secondary | ICD-10-CM | POA: Diagnosis not present

## 2018-12-11 LAB — CUP PACEART REMOTE DEVICE CHECK
Date Time Interrogation Session: 20191226011006
Implantable Pulse Generator Implant Date: 20190405

## 2018-12-11 NOTE — Progress Notes (Signed)
[facility] Summary Report / Loop Recorder 

## 2018-12-12 ENCOUNTER — Other Ambulatory Visit: Payer: Self-pay | Admitting: Pharmacist

## 2018-12-12 NOTE — Progress Notes (Signed)
[place]  [phone] [phone]) [phone]  ID: Julie Acevedo OB: [birth year]  MR#: [id number]  IDP#:[id number]  Patient Care Team: Jerrol Banana., MD as PCP - General (Family Medicine) Yolonda Kida, MD as PCP - Cardiology (Cardiology) Lorelee Cover., MD as Consulting Physician (Ophthalmology) Bjorn Loser, MD as Consulting Physician (Urology) Bary Castilla Forest Gleason, MD as Consulting Physician (General Surgery)  CHIEF COMPLAINT: Iron deficiency anemia.  INTERVAL HISTORY: Patient returns to clinic today for discussion of her laboratory work, further evaluation, and initiation of IV Feraheme.  She continues to have mild weakness and fatigue, but otherwise feels well.  She has no neurologic complaints.  She denies any recent fevers or illnesses.  She has a fair appetite, but denies weight loss.  She has no chest pain, cough, hemoptysis, or shortness of breath.  She denies any nausea, vomiting, constipation, or diarrhea.  She denies any melena or hematochezia.  She has no urinary complaints.  Patient offers no further specific complaints today.  REVIEW OF SYSTEMS:   Review of Systems  Constitutional: Positive for malaise/fatigue. Negative for fever and weight loss.  Respiratory: Negative.  Negative for cough and shortness of breath.   Cardiovascular: Negative.  Negative for chest pain and leg swelling.  Gastrointestinal: Negative.  Negative for abdominal pain, blood in stool and melena.  Genitourinary: Negative.  Negative for hematuria.  Musculoskeletal: Negative.  Negative for back pain.  Skin: Negative.  Negative for rash.  Neurological: Negative for dizziness, focal weakness, weakness and headaches.  Psychiatric/Behavioral: Negative.  The patient is not nervous/anxious.     As per HPI. Otherwise, a complete review of systems is negative.  PAST MEDICAL HISTORY: Past Medical History:  Diagnosis Date  . Arthritis   . Diverticulitis   .  DM (diabetes mellitus) ([place])    typ e 2  . HTN (hypertension)   . Memory difficulties 09/01/2015  . Short-term memory loss     PAST SURGICAL HISTORY: Past Surgical History:  Procedure Laterality Date  . ABDOMINAL HYSTERECTOMY     due to endometriosis-1 ovary left  . BREAST EXCISIONAL BIOPSY Right 1971   neg  . CARDIAC CATHETERIZATION  2000   no significant CAD per note of Dr Caryl Comes in 2013   . CHOLECYSTECTOMY    . CYSTOCELE REPAIR N/A 03/19/2017   Procedure: ANTERIOR REPAIR (CYSTOCELE);  Surgeon: Bjorn Loser, MD;  Location: [facility];  Service: Urology;  Laterality: N/A;  . CYSTOSCOPY N/A 03/19/2017   Procedure: CYSTOSCOPY;  Surgeon: Bjorn Loser, MD;  Location: [facility];  Service: Urology;  Laterality: N/A;  . EYE SURGERY    . FOOT SURGERY    . HAND SURGERY    . HEMICOLECTOMY    . INSERTION OF MESH N/A 08/21/2016   Procedure: INSERTION OF MESH;  Surgeon: Excell Seltzer, MD;  Location: [facility];  Service: General;  Laterality: N/A;  . LAPAROSCOPIC LYSIS OF ADHESIONS N/A 08/21/2016   Procedure: LAPAROSCOPIC LYSIS OF ADHESIONS;  Surgeon: Excell Seltzer, MD;  Location: [facility];  Service: General;  Laterality: N/A;  . LAPAROSCOPIC REMOVAL ABDOMINAL MASS    . LOOP RECORDER INSERTION N/A 03/21/2018   Procedure: LOOP RECORDER INSERTION;  Surgeon: Sanda Klein, MD;  Location: [place] [facility];  Service: Cardiovascular;  Laterality: N/A;  . LUMBAR [place]    . ROUX-EN-Y GASTRIC BYPASS  2010   Dr Hassell Done  . TONSILLECTOMY    . UPPER GI ENDOSCOPY  01/12/16   normal larynx, normal esophagus.  gastric bypass with a normal-sized pouch and intact staple line, normal examined jejunum, otherwise exam was normal  . VENTRAL HERNIA REPAIR N/A 08/21/2016   Procedure: LAPAROSCOPIC VENTRAL HERNIA;  Surgeon: Excell Seltzer, MD;  [facility]: [facility];  Service: General;  Laterality: N/A;    FAMILY HISTORY: Family History  Problem Relation Age of Onset  . Cancer Father 62       Lung  . Coronary  artery disease Father 3  . COPD Mother   . Diabetes Mother   . Hypertension Mother   . Cancer Paternal Grandmother        Breast  . Breast cancer Paternal Grandmother   . Congestive Heart Failure Maternal Grandmother   . Emphysema Maternal Grandfather   . Heart attack Paternal Grandfather   . Cancer Paternal Aunt        Breast  . Breast cancer Paternal Aunt 11  . Breast cancer Paternal Aunt 54    ADVANCED DIRECTIVES (Y/N):  N  HEALTH MAINTENANCE: Social History   Tobacco Use  . Smoking status: Never Smoker  . Smokeless tobacco: Never Used  Substance Use Topics  . Alcohol use: No  . Drug use: No     Colonoscopy:  PAP:  Bone density:  Lipid panel:  Allergies  Allergen Reactions  . Lotrel [Amlodipine Besy-Benazepril Hcl] Anaphylaxis  . Contrast Media [Iodinated Diagnostic Agents] Swelling and Other (See Comments)    Reaction:  Neck swelling   . Niacin Hives  . Sulfa Antibiotics Hives    Current Outpatient Medications  Medication Sig Dispense Refill  . ALPRAZolam (XANAX) 0.5 MG tablet Take 1 tablet (0.5 mg total) by mouth at bedtime as needed for anxiety. Take 1 PO, 1 hour before flying 5 tablet 0  . Elastic Bandages & Supports (COMPRESSION & SUPPORT GLOVES L) MISC     . folic acid (FOLVITE) 1 MG tablet Take 1 tablet (1 mg total) by mouth daily. 30 tablet 0  . gabapentin (NEURONTIN) 100 MG capsule TAKE 2 CAPSULES (200 MG TOTAL) BY MOUTH 3 (THREE) TIMES DAILY. 540 capsule 1  . hydrochlorothiazide (HYDRODIURIL) 25 MG tablet TAKE 1 TABLET (25 MG TOTAL) BY MOUTH DAILY.  3  . hydrochlorothiazide (HYDRODIURIL) 25 MG tablet TAKE 1 TABLET (25 MG TOTAL) BY MOUTH DAILY. 90 tablet 3  . linaclotide (LINZESS) 290 MCG CAPS capsule Take 1 capsule (290 mcg total) by mouth daily before breakfast. 90 capsule 3  . Melatonin 10 MG TABS Take 10 mg by mouth at bedtime as needed (for sleep.).     Marland Kitchen meloxicam (MOBIC) 15 MG tablet Take 15 mg by mouth daily.     . metFORMIN (GLUCOPHAGE) 500  MG tablet Take 1 tablet (500 mg total) by mouth daily with breakfast. 90 tablet 0  . metoprolol succinate (TOPROL-XL) 25 MG 24 hr tablet Take 25 mg by mouth daily.     No current facility-administered medications for this visit.     OBJECTIVE: Vitals:   12/15/18 1414  BP: 116/77  Pulse: (!) 59  Resp: 18  Temp: (!) 97 F (36.1 C)     Body mass index is 26.98 kg/m.    ECOG FS:0 - Asymptomatic  General: Well-developed, well-nourished, no acute distress. Eyes: Pink conjunctiva, anicteric sclera. HEENT: Normocephalic, moist mucous membranes. Lungs: Clear to auscultation bilaterally. Heart: Regular rate and rhythm. No rubs, murmurs, or gallops. Abdomen: Soft, nontender, nondistended. No organomegaly noted, normoactive bowel sounds. Musculoskeletal: No edema, cyanosis, or clubbing. Neuro: Alert, answering all questions appropriately. Cranial  nerves grossly intact. Skin: No rashes or petechiae noted. Psych: Normal affect.  LAB RESULTS:  Lab Results  Component Value Date   NA 141 10/13/2018   K 4.5 10/13/2018   CL 107 10/13/2018   CO2 25 10/13/2018   GLUCOSE 136 (H) 10/13/2018   BUN 24 (H) 10/13/2018   CREATININE 1.48 (H) 10/13/2018   CALCIUM 8.7 (L) 10/13/2018   PROT 6.7 06/26/2018   ALBUMIN 3.6 06/26/2018   AST 11 06/26/2018   ALT 8 06/26/2018   ALKPHOS 150 (H) 06/26/2018   BILITOT 0.2 06/26/2018   GFRNONAA 36 (L) 10/13/2018   GFRAA 42 (L) 10/13/2018    Lab Results  Component Value Date   WBC 9.1 11/18/2018   NEUTROABS 4.1 06/26/2018   HGB 8.4 (L) 11/18/2018   HCT 28.2 (L) 11/18/2018   MCV 77.5 (L) 11/18/2018   PLT 390 11/18/2018    Lab Results  Component Value Date   IRON 13 (L) 11/18/2018   TIBC 401 11/18/2018   IRONPCTSAT 3 (L) 11/18/2018   Lab Results  Component Value Date   FERRITIN 7 (L) 11/18/2018    STUDIES: No results found.  ASSESSMENT: Iron deficiency anemia.  PLAN:   1.  Iron deficiency anemia: Patient's hemoglobin and iron stores are  significantly decreased.  All of her other laboratory work including hemolysis labs, B12, folate, and SPEP are all negative or within normal limits.  Her reticulocyte count is inappropriately decreased.  She has appropriately increased erythropoietin level. Patient had a colonoscopy on September 05, 2018 that was reported as normal.  Proceed with 510 mg IV Feraheme today.  Return to clinic in 1 week for second infusion.  Patient will then return to clinic in 3 months with repeat laboratory work and further evaluation.  I spent a total of 30 minutes face-to-face with the patient of which greater than 50% of the visit was spent in counseling and coordination of care as detailed above.   Patient expressed understanding and was in agreement with this plan. She also understands that She can call clinic at any time with any questions, concerns, or complaints.    Lloyd Huger, MD   12/15/2018 6:14 PM

## 2018-12-12 NOTE — Patient Outreach (Signed)
[facility]) Care Management  12/12/2018  Julie Acevedo [birth year] [id number]   Patient was called regarding medication adherence with metformin. Unfortunately, she did not answer the phone. HIPAA compliant message was left with a gentleman who answered the phone.  The gentleman said the patient was out of town and would be back on Monday.  Patient's chart was reviewed and her local pharmacy was called. Metformin has not been filled since August but is still on the patient's medication list.  Since the gentleman said the patient would be back Sunday, I will call her next week.   Elayne Guerin, PharmD, [place] Clinical Pharmacist [phone]

## 2018-12-15 ENCOUNTER — Inpatient Hospital Stay: Payer: Medicare HMO

## 2018-12-15 ENCOUNTER — Inpatient Hospital Stay (HOSPITAL_BASED_OUTPATIENT_CLINIC_OR_DEPARTMENT_OTHER): Payer: Medicare HMO | Admitting: Oncology

## 2018-12-15 VITALS — BP 116/77 | HR 59 | Temp 97.0°F | Resp 18 | Wt 152.3 lb

## 2018-12-15 VITALS — BP 110/72 | HR 63 | Resp 18

## 2018-12-15 DIAGNOSIS — R531 Weakness: Secondary | ICD-10-CM

## 2018-12-15 DIAGNOSIS — E119 Type 2 diabetes mellitus without complications: Secondary | ICD-10-CM

## 2018-12-15 DIAGNOSIS — R5383 Other fatigue: Secondary | ICD-10-CM

## 2018-12-15 DIAGNOSIS — I10 Essential (primary) hypertension: Secondary | ICD-10-CM | POA: Diagnosis not present

## 2018-12-15 DIAGNOSIS — D509 Iron deficiency anemia, unspecified: Secondary | ICD-10-CM

## 2018-12-15 DIAGNOSIS — D649 Anemia, unspecified: Secondary | ICD-10-CM | POA: Diagnosis not present

## 2018-12-15 MED ORDER — SODIUM CHLORIDE 0.9 % IV SOLN
510.0000 mg | Freq: Once | INTRAVENOUS | Status: AC
  Administered 2018-12-15: 510 mg via INTRAVENOUS
  Filled 2018-12-15: qty 17

## 2018-12-15 MED ORDER — SODIUM CHLORIDE 0.9 % IV SOLN
Freq: Once | INTRAVENOUS | Status: AC
  Administered 2018-12-15: 15:00:00 via INTRAVENOUS
  Filled 2018-12-15: qty 250

## 2018-12-15 NOTE — Progress Notes (Signed)
Patient here today for follow up regarding anemia, results.

## 2018-12-23 ENCOUNTER — Inpatient Hospital Stay: Payer: Medicare HMO | Attending: Oncology

## 2018-12-23 VITALS — BP 102/62 | HR 65 | Temp 97.7°F | Resp 20

## 2018-12-23 DIAGNOSIS — D509 Iron deficiency anemia, unspecified: Secondary | ICD-10-CM | POA: Diagnosis not present

## 2018-12-23 MED ORDER — SODIUM CHLORIDE 0.9 % IV SOLN
510.0000 mg | Freq: Once | INTRAVENOUS | Status: AC
  Administered 2018-12-23: 510 mg via INTRAVENOUS
  Filled 2018-12-23: qty 17

## 2018-12-23 MED ORDER — SODIUM CHLORIDE 0.9 % IV SOLN
Freq: Once | INTRAVENOUS | Status: AC
  Administered 2018-12-23: 14:00:00 via INTRAVENOUS
  Filled 2018-12-23: qty 250

## 2018-12-28 LAB — CUP PACEART REMOTE DEVICE CHECK
Date Time Interrogation Session: 20191122233542
Implantable Pulse Generator Implant Date: 20190405

## 2019-01-12 ENCOUNTER — Ambulatory Visit (INDEPENDENT_AMBULATORY_CARE_PROVIDER_SITE_OTHER): Payer: Medicare HMO

## 2019-01-12 DIAGNOSIS — R55 Syncope and collapse: Secondary | ICD-10-CM | POA: Diagnosis not present

## 2019-01-13 NOTE — Progress Notes (Signed)
[facility] Summary Report / Loop Recorder 

## 2019-01-14 LAB — CUP PACEART REMOTE DEVICE CHECK
Date Time Interrogation Session: 20200128010954
Implantable Pulse Generator Implant Date: 20190405

## 2019-02-04 IMAGING — MG 2D DIGITAL DIAGNOSTIC UNILATERAL RIGHT MAMMOGRAM WITH CAD AND AD
9 of 13 series · 9 of 25 positions shown · non-contrast
Comparison: Multiple prior studies including 0993

CLINICAL DATA: First six-month follow-up for right breast
calcifications.

EXAM:
2D DIGITAL DIAGNOSTIC UNILATERAL RIGHT MAMMOGRAM WITH CAD AND
ADJUNCT TOMO

[R CC (1 of 3)]
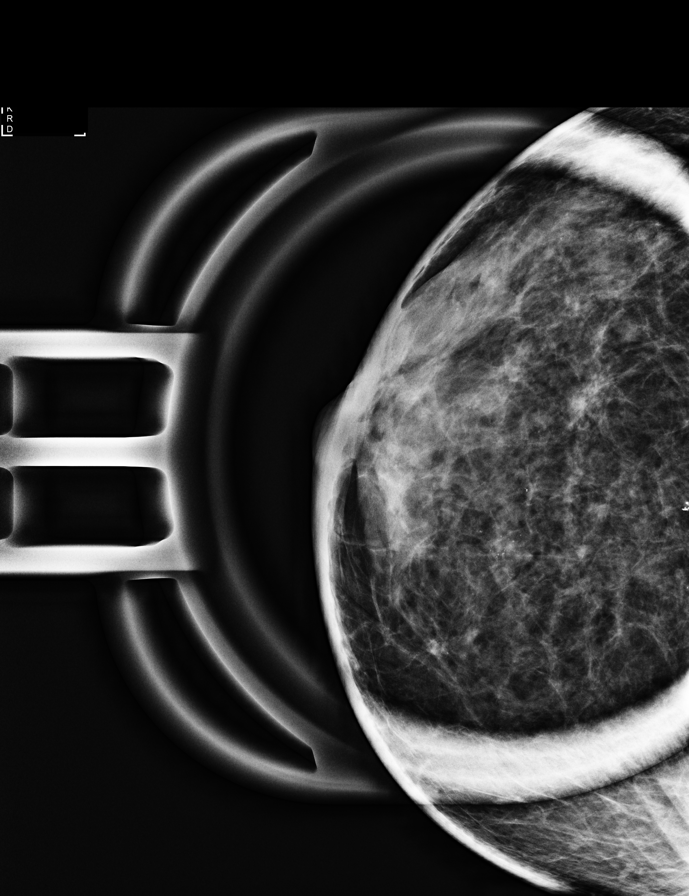

[R ML (1 of 2)]
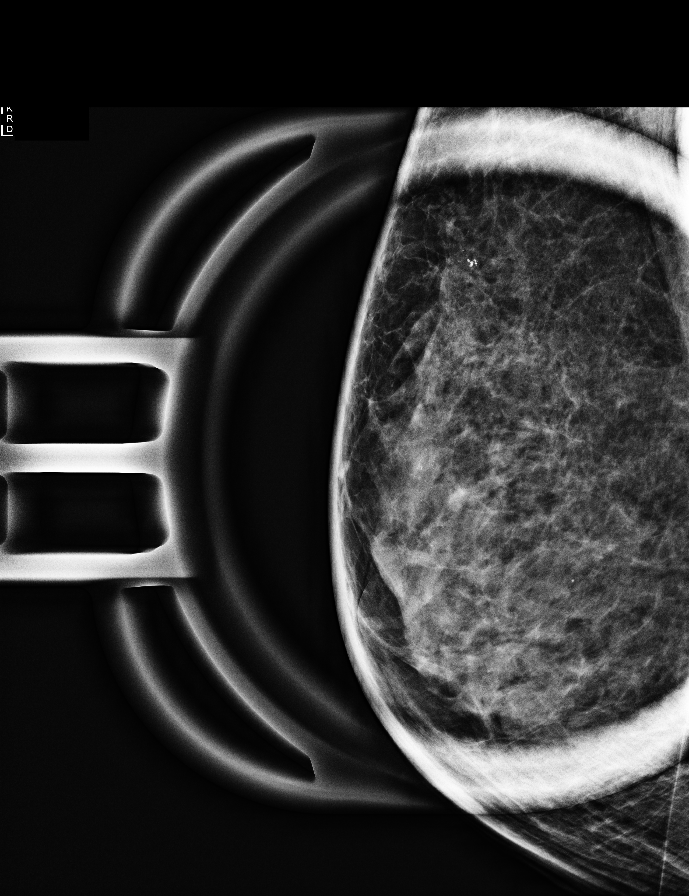

[R CC (2 of 3)]
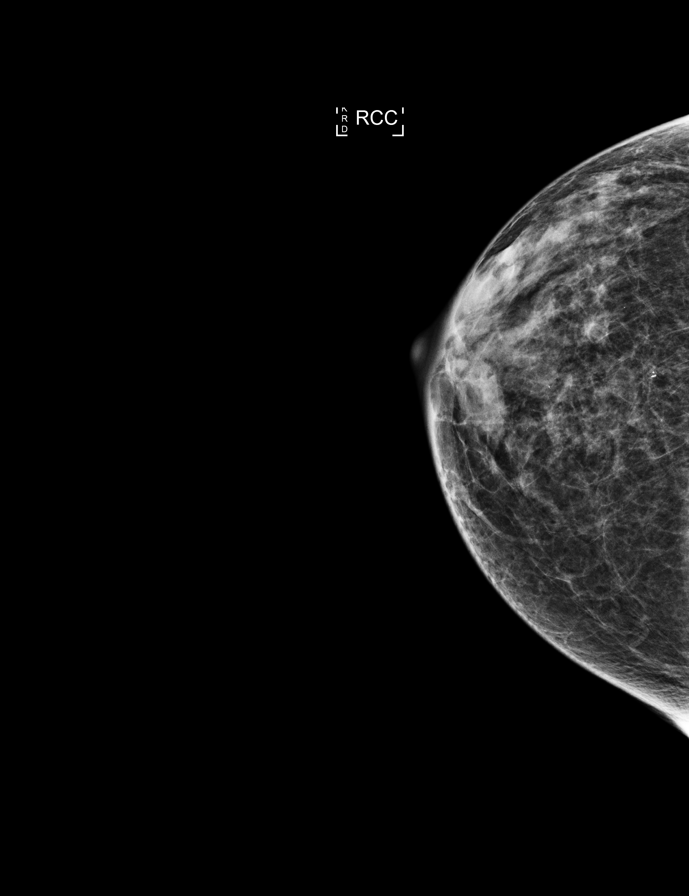

[R MLO]
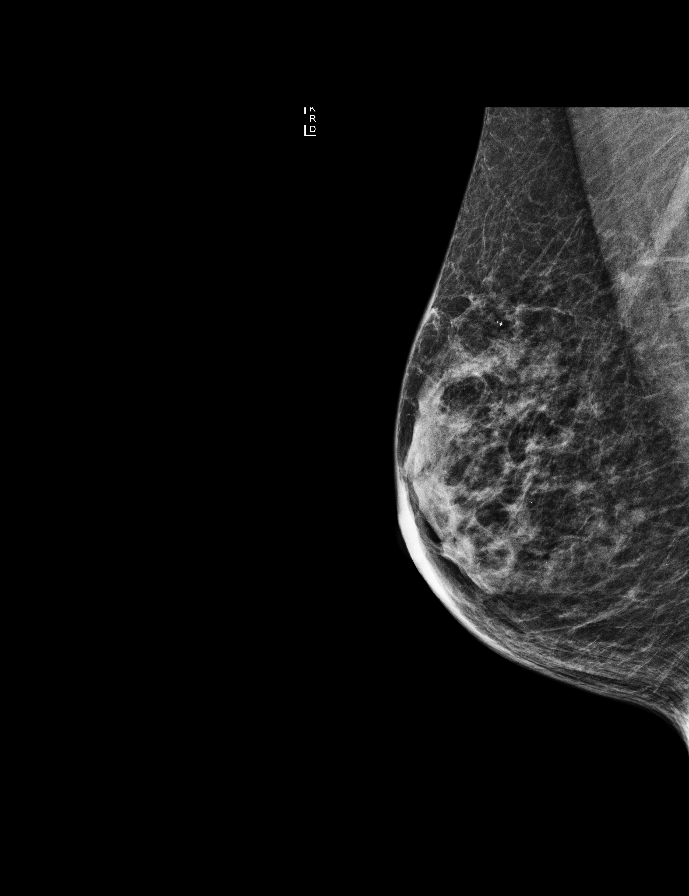

[R ML (2 of 2)]
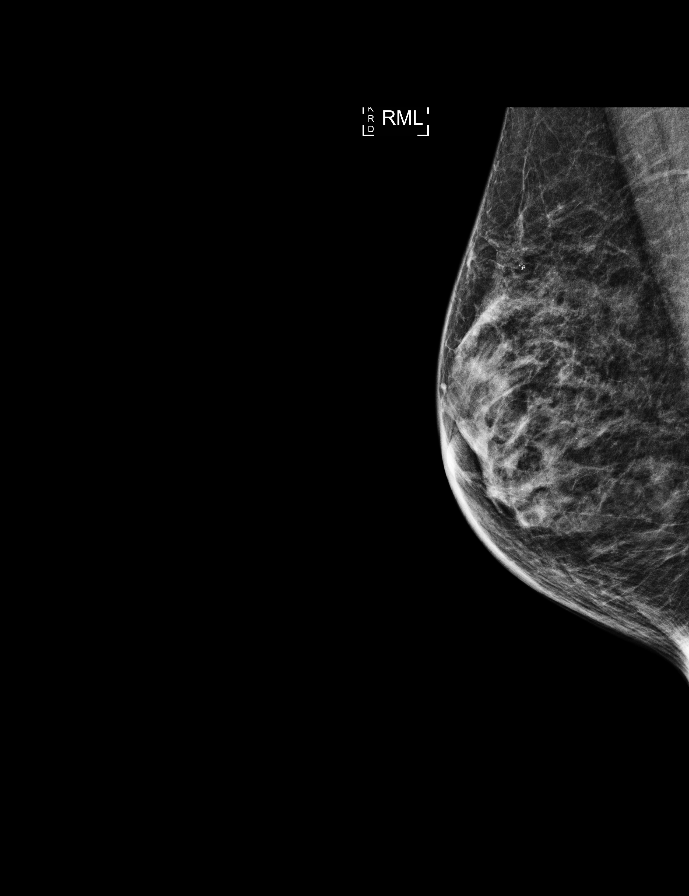

[R CC synth-2D]
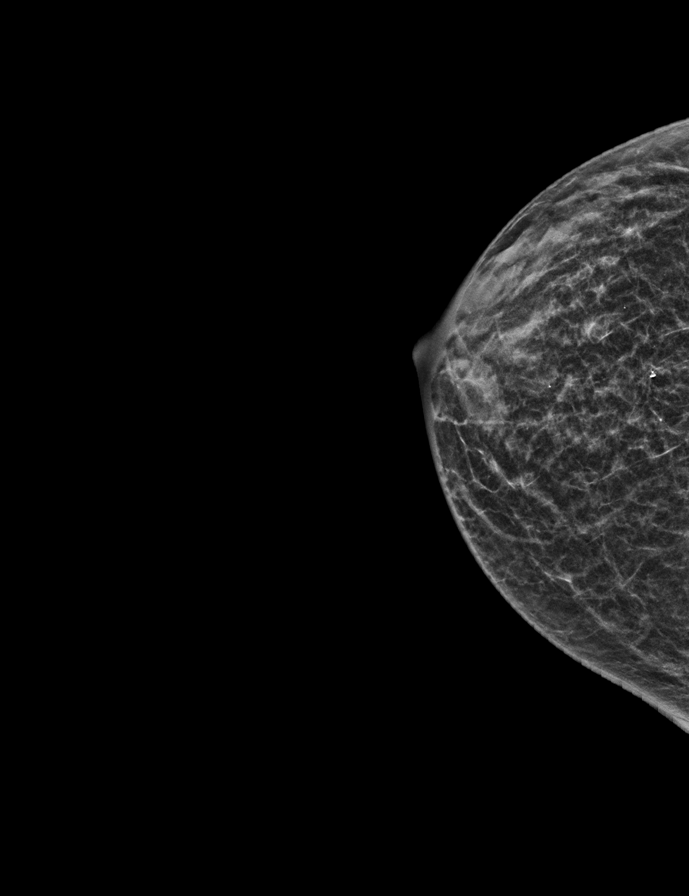

[R MLO synth-2D]
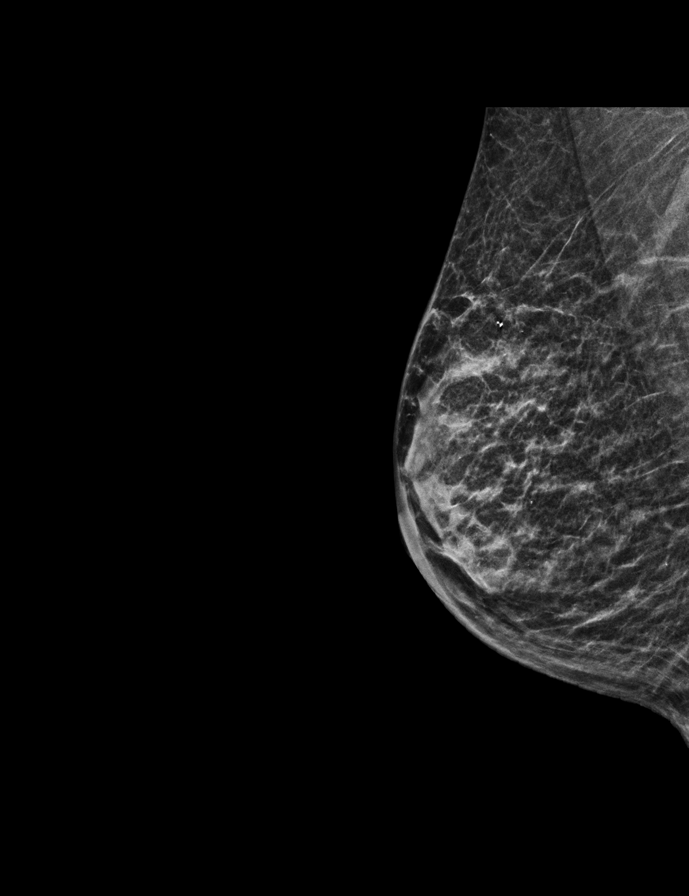

[R ML synth-2D]
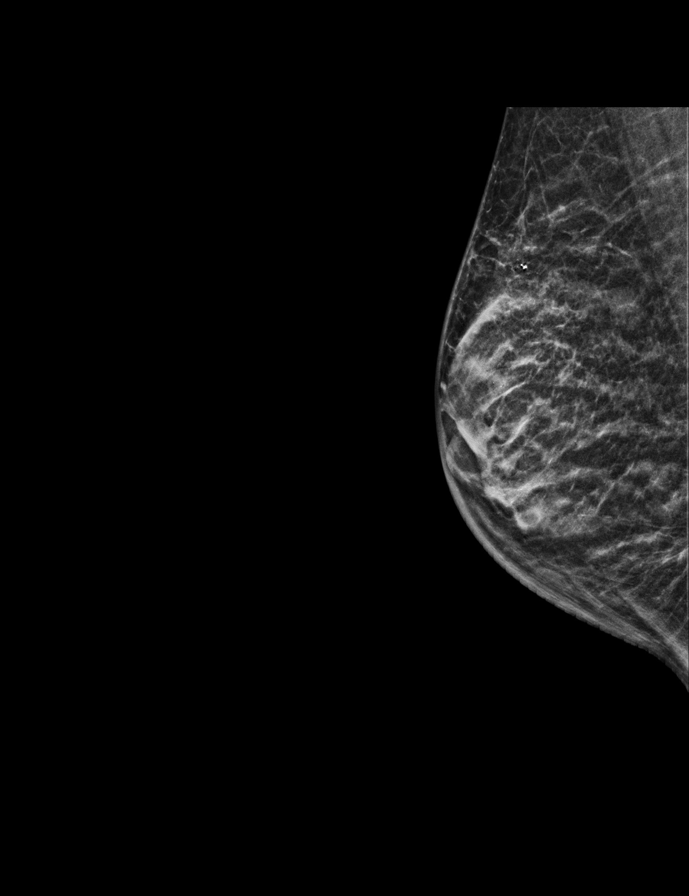

[R CC (3 of 3)]
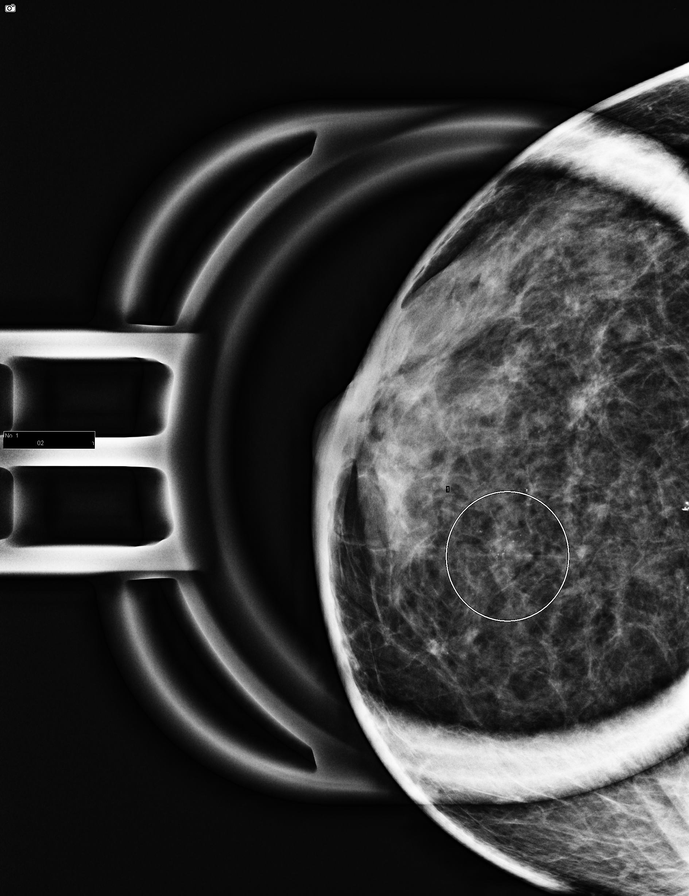

[9 of 25 positions shown; findings below may reference images not displayed]

ACR Breast Density Category c: The breast tissue is heterogeneously
dense, which may obscure small masses.
FINDINGS: Magnified views are performed of calcifications in the upper central
portion of the right breast, anterior depth. Calcifications are
primarily punctate and superior unchanged. No suspicious morphology
or distribution of calcifications identified. No new distortion or
mass identified.

Mammographic images were processed with CAD.
IMPRESSION: Stable, benign-appearing right breast calcifications for which
follow-up is recommended.

RECOMMENDATION:
Bilateral diagnostic mammogram is recommended in 6 months appear

I have discussed the findings and recommendations with the patient.
Results were also provided in writing at the conclusion of the
visit. If applicable, a reminder letter will be sent to the patient
regarding the next appointment.

BI-RADS CATEGORY  3: Probably benign.

## 2019-02-05 ENCOUNTER — Ambulatory Visit (INDEPENDENT_AMBULATORY_CARE_PROVIDER_SITE_OTHER): Payer: Medicare HMO

## 2019-02-05 VITALS — BP 114/64 | HR 60 | Temp 98.3°F | Ht 63.0 in | Wt 157.6 lb

## 2019-02-05 DIAGNOSIS — Z0000 Encounter for general adult medical examination without abnormal findings: Secondary | ICD-10-CM

## 2019-02-05 NOTE — Progress Notes (Signed)
Subjective:   Julie Acevedo is a 68 y.o. female who presents for Medicare Annual (Subsequent) preventive examination.  Review of Systems:  N/A  Cardiac Risk Factors include: advanced age (>27men, >21 women);diabetes mellitus;dyslipidemia;hypertension     Objective:     Vitals: BP 114/64 (BP Location: Left Arm)   Pulse 60   Temp 98.3 F (36.8 C) (Oral)   Ht 5\' 3"  (1.6 m)   Wt 157 lb 9.6 oz (71.5 kg)   BMI 27.92 kg/m   Body mass index is 27.92 kg/m.  Advanced Directives 02/05/2019 12/15/2018 11/18/2018 10/13/2018 03/19/2018 02/04/2018 06/10/2017  Does Patient Have a Medical Advance Directive? No No No No No No No  Would patient like information on creating a medical advance directive? No - Patient declined No - Patient declined No - Patient declined - No - Patient declined No - Patient declined No - Patient declined  Pre-existing out of facility DNR order (yellow form or pink MOST form) - - - - - - -    Tobacco Social History   Tobacco Use  Smoking Status Never Smoker  Smokeless Tobacco Never Used     Counseling given: Not Answered   Clinical Intake:  Pre-visit preparation completed: Yes  Pain : 0-10 Pain Score: 8  Pain Type: Chronic pain Pain Location: Shoulder Pain Orientation: Right Pain Descriptors / Indicators: Aching Pain Frequency: Constant    Diabetes:  Is the patient diabetic?  Yes type 2 If diabetic, was a CBG obtained today?  No  Did the patient bring in their glucometer from home?  No  How often do you monitor your CBG's? Once a day.   Financial Strains and Diabetes Management:  Are you having any financial strains with the device, your supplies or your medication? No .  Does the patient want to be seen by Chronic Care Management for management of their diabetes?  No  Would the patient like to be referred to a Nutritionist or for Diabetic Management?  No   Diabetic Exams:  Diabetic Eye Exam: Completed 12/27/17. Overdue for diabetic eye exam. Pt  has been advised about the importance in completing this exam. Pt plans to set up an apt with Dr Gloriann Loan this year.   Diabetic Foot Exam: Completed 11/18/17. Pt has been advised about the importance in completing this exam. Note made to f/u on this at next OV.    Nutritional Status: BMI 25 -29 Overweight Nutritional Risks: None   How often do you need to have someone help you when you read instructions, pamphlets, or other written materials from your doctor or pharmacy?: 1 - Never  Interpreter Needed?: No  Information entered by :: [facility], LPN  Past Medical History:  Diagnosis Date  . Arthritis   . Diverticulitis   . DM (diabetes mellitus) ([place])    typ e 2  . HTN (hypertension)   . Hyperlipidemia   . Memory difficulties 09/01/2015  . Short-term memory loss    Past Surgical History:  Procedure Laterality Date  . ABDOMINAL HYSTERECTOMY     due to endometriosis-1 ovary left  . BREAST EXCISIONAL BIOPSY Right 1971   neg  . CARDIAC CATHETERIZATION  2000   no significant CAD per note of Dr Caryl Comes in 2013   . CHOLECYSTECTOMY    . CYSTOCELE REPAIR N/A 03/19/2017   Procedure: ANTERIOR REPAIR (CYSTOCELE);  Surgeon: Bjorn Loser, MD;  [facility];  Service: Urology;  Laterality: N/A;  . CYSTOSCOPY N/A 03/19/2017   Procedure: CYSTOSCOPY;  Surgeon: Bjorn Loser, MD;  [facility];  Service: Urology;  Laterality: N/A;  . EYE SURGERY    . FOOT SURGERY    . HAND SURGERY    . HEMICOLECTOMY    . INSERTION OF MESH N/A 08/21/2016   Procedure: INSERTION OF MESH;  Surgeon: Excell Seltzer, MD;  [facility];  Service: General;  Laterality: N/A;  . LAPAROSCOPIC LYSIS OF ADHESIONS N/A 08/21/2016   Procedure: LAPAROSCOPIC LYSIS OF ADHESIONS;  Surgeon: Excell Seltzer, MD;  [facility];  Service: General;  Laterality: N/A;  . LAPAROSCOPIC REMOVAL ABDOMINAL MASS    . LOOP RECORDER INSERTION N/A 03/21/2018   Procedure: LOOP RECORDER INSERTION;  Surgeon: Sanda Klein, MD;   Location: [place] [facility];  Service: Cardiovascular;  Laterality: N/A;  . LUMBAR [place]    . ROUX-EN-Y GASTRIC BYPASS  2010   Dr Hassell Done  . TONSILLECTOMY    . UPPER GI ENDOSCOPY  01/12/16   normal larynx, normal esophagus. gastric bypass with a normal-sized pouch and intact staple line, normal examined jejunum, otherwise exam was normal  . VENTRAL HERNIA REPAIR N/A 08/21/2016   Procedure: LAPAROSCOPIC VENTRAL HERNIA;  Surgeon: Excell Seltzer, MD;  [facility];  Service: General;  Laterality: N/A;   Family History  Problem Relation Age of Onset  . Cancer Father 28       Lung  . Coronary artery disease Father 1  . COPD Mother   . Diabetes Mother   . Hypertension Mother   . Cancer Paternal Grandmother        Breast  . Breast cancer Paternal Grandmother   . Congestive Heart Failure Maternal Grandmother   . Emphysema Maternal Grandfather   . Heart attack Paternal Grandfather   . Cancer Paternal Aunt        Breast  . Breast cancer Paternal Aunt 51  . Breast cancer Paternal Aunt 3   Social History   Socioeconomic History  . Marital status: Married    Spouse name: Not on file  . Number of children: 3  . Years of education: 61  . Highest education level: Some college, no degree  Occupational History  . Occupation: IT consultant: RETIRED  Social Needs  . Financial resource strain: Not hard at all  . Food insecurity:    Worry: Never true    Inability: Never true  . Transportation needs:    Medical: No    Non-medical: No  Tobacco Use  . Smoking status: Never Smoker  . Smokeless tobacco: Never Used  Substance and Sexual Activity  . Alcohol use: No  . Drug use: No  . Sexual activity: Never  Lifestyle  . Physical activity:    Days per week: 0 days    Minutes per session: 0 min  . Stress: Only a little  Relationships  . Social connections:    Talks on phone: Patient refused    Gets together: Patient refused    Attends religious service: Patient  refused    Active member of club or organization: Patient refused    Attends meetings of clubs or organizations: Patient refused    Relationship status: Patient refused  Other Topics Concern  . Not on file  Social History Narrative   Lives with husband, daughter and granddaughter.;   Patient drinks about 3 cups of caffeine daily.   Patient is left handed.     Outpatient Encounter Medications as of 02/05/2019  Medication Sig  . Elastic Bandages &  Supports (COMPRESSION & SUPPORT GLOVES L) MISC   . folic acid (FOLVITE) 1 MG tablet Take 1 tablet (1 mg total) by mouth daily.  Marland Kitchen gabapentin (NEURONTIN) 100 MG capsule TAKE 2 CAPSULES (200 MG TOTAL) BY MOUTH 3 (THREE) TIMES DAILY.  . hydrochlorothiazide (HYDRODIURIL) 25 MG tablet TAKE 1 TABLET (25 MG TOTAL) BY MOUTH DAILY.  Marland Kitchen linaclotide (LINZESS) 290 MCG CAPS capsule Take 1 capsule (290 mcg total) by mouth daily before breakfast.  . Melatonin 10 MG TABS Take 10 mg by mouth at bedtime as needed (for sleep.).   Marland Kitchen meloxicam (MOBIC) 15 MG tablet Take 15 mg by mouth daily.   . metFORMIN (GLUCOPHAGE) 500 MG tablet Take 1 tablet (500 mg total) by mouth daily with breakfast.  . metoprolol succinate (TOPROL-XL) 25 MG 24 hr tablet Take 25 mg by mouth daily.  . traZODone (DESYREL) 50 MG tablet Take 50 mg by mouth at bedtime as needed.   . ALPRAZolam (XANAX) 0.5 MG tablet Take 1 tablet (0.5 mg total) by mouth at bedtime as needed for anxiety. Take 1 PO, 1 hour before flying (Patient not taking: Reported on 02/05/2019)  . hydrochlorothiazide (HYDRODIURIL) 25 MG tablet TAKE 1 TABLET (25 MG TOTAL) BY MOUTH DAILY.   No facility-administered encounter medications on file as of 02/05/2019.     Activities of Daily Living In your present state of health, do you have any difficulty performing the following activities: 02/05/2019 03/19/2018  Hearing? N N  Vision? N N  Difficulty concentrating or making decisions? Tempie Donning  Walking or climbing stairs? Y N  Comment Due to  back pain.  -  Dressing or bathing? Y N  Comment Only recently due to right shoulder pain and limited ROM. -  Doing errands, shopping? N N  Preparing Food and eating ? N -  Using the Toilet? N -  In the past six months, have you accidently leaked urine? N -  Do you have problems with loss of bowel control? N -  Managing your Medications? N -  Managing your Finances? N -  Housekeeping or managing your Housekeeping? N -  Some recent data might be hidden    Patient Care Team: Jerrol Banana., MD as PCP - General (Family Medicine) Yolonda Kida, MD as PCP - Cardiology (Cardiology) Lorelee Cover., MD as Consulting Physician (Ophthalmology) Bjorn Loser, MD as Consulting Physician (Urology) Bary Castilla Forest Gleason, MD as Consulting Physician (General Surgery)    Assessment:   This is a routine wellness examination for [place].  Exercise Activities and Dietary recommendations Current Exercise Habits: The patient does not participate in regular exercise at present, Exercise limited by: orthopedic condition(s);neurologic condition(s)  Goals    . DIET - INCREASE WATER INTAKE     Recommend increasing water intake to 4-6 glasses a day.     Marland Kitchen LIFESTYLE - DECREASE FALLS RISK     Recommend to remove any items from the home that may cause slips or trips.       Fall Risk Fall Risk  02/05/2019 02/04/2018 02/28/2017  Falls in the past year? 1 No No  Number falls in past yr: 0 - -  Injury with Fall? 0 - -   FALL RISK PREVENTION PERTAINING TO THE HOME: Any stairs in or around the home? Yes  If so, do they handrails? Yes   Home free of loose throw rugs in walkways, pet beds, electrical cords, etc? Yes  Adequate lighting in your home to reduce risk  of falls? Yes   ASSISTIVE DEVICES UTILIZED TO PREVENT FALLS:  Life alert? No  Use of a cane, walker or w/c? No  Grab bars in the bathroom? No  Shower chair or bench in shower? No  Elevated toilet seat or a handicapped toilet? No     TIMED UP AND GO:  Was the test performed? No .     Depression Screen PHQ 2/9 Scores 02/05/2019 02/04/2018 02/28/2017  PHQ - 2 Score 0 1 0  PHQ- 9 Score - - 5     Cognitive Function: Declined today.  MMSE - Mini Mental State Exam 09/01/2015  Orientation to time 4  Orientation to Place 4  Registration 2  Attention/ Calculation 5  Recall 3  Language- name 2 objects 2  Language- repeat 0  Language- follow 3 step command 2  Language- read & follow direction 1  Write a sentence 1  Copy design 1  Total score 25     6CIT Screen 12/24/2016  What Year? 0 points  What month? 0 points  What time? 0 points  Count back from 20 0 points  Months in reverse 0 points  Repeat phrase 4 points  Total Score 4    Immunization History  Administered Date(s) Administered  . Influenza Split 10/17/2010, 10/02/2011, 08/27/2012  . Influenza, High Dose Seasonal PF 08/29/2018  . Influenza,inj,Quad PF,6+ Mos 09/26/2013, 09/06/2014, 09/06/2016  . Influenza,inj,Quad PF,6-35 Mos 08/31/2017  . Influenza-Unspecified 09/15/2015  . Pneumococcal Conjugate-13 08/29/2018  . Pneumococcal Polysaccharide-23 11/17/2012  . Tdap 10/02/2011, 09/27/2016  . Zoster 09/06/2016  . Zoster Recombinat (Shingrix) 08/29/2018    Qualifies for Shingles Vaccine? Yes , due for second dose.   Tdap: Up to date  Flu Vaccine: Up to date  Pneumococcal Vaccine: Up to date   Screening Tests Health Maintenance  Topic Date Due  . OPHTHALMOLOGY EXAM  08/12/2018  . FOOT EXAM  11/18/2018  . URINE MICROALBUMIN  11/18/2018  . HEMOGLOBIN A1C  12/27/2018  . PNA vac Low Risk Adult (2 of 2 - PPSV23) 08/30/2019  . DEXA SCAN  02/07/2020  . MAMMOGRAM  11/06/2020  . TETANUS/TDAP  09/27/2026  . COLONOSCOPY  09/05/2028  . INFLUENZA VACCINE  Completed  . Hepatitis C Screening  Completed  . HIV Screening  Completed    Cancer Screenings:  Colorectal Screening: Completed 09/05/18. Repeat every 10 years.  Mammogram: Completed  11/06/18.   Bone Density: Completed 02/06/10. Results reflect NORMAL. Repeat every 10 years.   Lung Cancer Screening: (Low Dose CT Chest recommended if Age 69-80 years, 30 pack-year currently smoking OR have quit w/in 15years.) does not qualify.    Additional Screening:  Hepatitis C Screening: Up to date  Vision Screening: Recommended annual ophthalmology exams for early detection of glaucoma and other disorders of the eye.  Dental Screening: Recommended annual dental exams for proper oral hygiene  Community Resource Referral:  CRR required this visit?  No      Plan:  I have personally reviewed and addressed the Medicare Annual Wellness questionnaire and have noted the following in the patient's chart:  A. Medical and social history B. Use of alcohol, tobacco or illicit drugs  C. Current medications and supplements D. Functional ability and status E.  Nutritional status F.  Physical activity G. Advance directives H. List of other physicians I.  Hospitalizations, surgeries, and ER visits in previous 12 months J.  [place] such as hearing and vision if needed, cognitive and depression L. Referrals and appointments -  none  In addition, I have reviewed and discussed with patient certain preventive protocols, quality metrics, and best practice recommendations. A written personalized care plan for preventive services as well as general preventive health recommendations were provided to patient.  See attached scanned questionnaire for additional information.   Signed,  Fabio Neighbors, LPN Nurse Health Advisor   Nurse Recommendations: Pt needs a diabetic foot exam, urine and Hgb A1c checked at next OV. Pt plans to set up an eye exam this year.

## 2019-02-05 NOTE — Patient Instructions (Signed)
[name] , Thank you for taking time to come for your Medicare Wellness Visit. I appreciate your ongoing commitment to your health goals. Please review the following plan we discussed and let me know if I can assist you in the future.   Screening recommendations/referrals: Colonoscopy: Up to date, due 08/2028 Mammogram: Up to date, due 10/2020 Bone Density: Up to date, due 01/2020 Recommended yearly ophthalmology/optometry visit for glaucoma screening and checkup Recommended yearly dental visit for hygiene and checkup  Vaccinations: Influenza vaccine: Up to date Pneumococcal vaccine: Completed series Tdap vaccine: Up to date, due 09/2026 Shingles vaccine: second dose due    Advanced directives: Advance directive discussed with you today. Even though you declined this today please call our office should you change your mind and we can give you the proper paperwork for you to fill out.  Conditions/risks identified: Fall risk prevention.  Next appointment: 04/20/19 @ 10:00 AM with Dr Rosanna Randy.    Preventive Care 22 Years and Older, Female Preventive care refers to lifestyle choices and visits with your health care provider that can promote health and wellness. What does preventive care include?  A yearly physical exam. This is also called an annual well check.  Dental exams once or twice a year.  Routine eye exams. Ask your health care provider how often you should have your eyes checked.  Personal lifestyle choices, including:  Daily care of your teeth and gums.  Regular physical activity.  Eating a healthy diet.  Avoiding tobacco and drug use.  Limiting alcohol use.  Practicing safe sex.  Taking low-dose aspirin every day.  Taking vitamin and mineral supplements as recommended by your health care provider. What happens during an annual well check? The services and screenings done by your health care provider during your annual well check will depend on your age,  overall health, lifestyle risk factors, and family history of disease. Counseling  Your health care provider may ask you questions about your:  Alcohol use.  Tobacco use.  Drug use.  Emotional well-being.  Home and relationship well-being.  Sexual activity.  Eating habits.  History of falls.  Memory and ability to understand (cognition).  Work and work Statistician.  Reproductive health. Screening  You may have the following tests or measurements:  Height, weight, and BMI.  Blood pressure.  Lipid and cholesterol levels. These may be checked every 5 years, or more frequently if you are over 77 years old.  Skin check.  Lung cancer screening. You may have this screening every year starting at age 6 if you have a 30-pack-year history of smoking and currently smoke or have quit within the past 15 years.  Fecal occult blood test (FOBT) of the stool. You may have this test every year starting at age 47.  Flexible sigmoidoscopy or colonoscopy. You may have a sigmoidoscopy every 5 years or a colonoscopy every 10 years starting at age 72.  Hepatitis C blood test.  Hepatitis B blood test.  Sexually transmitted disease (STD) testing.  Diabetes screening. This is done by checking your blood sugar (glucose) after you have not eaten for a while (fasting). You may have this done every 1-3 years.  Bone density scan. This is done to screen for osteoporosis. You may have this done starting at age 39.  Mammogram. This may be done every 1-2 years. Talk to your health care provider about how often you should have regular mammograms. Talk with your health care provider about your test results, treatment options,  and if necessary, the need for more tests. Vaccines  Your health care provider may recommend certain vaccines, such as:  Influenza vaccine. This is recommended every year.  Tetanus, diphtheria, and acellular pertussis (Tdap, Td) vaccine. You may need a Td booster every 10  years.  Zoster vaccine. You may need this after age 14.  Pneumococcal 13-valent conjugate (PCV13) vaccine. One dose is recommended after age 1.  Pneumococcal polysaccharide (PPSV23) vaccine. One dose is recommended after age 46. Talk to your health care provider about which screenings and vaccines you need and how often you need them. This information is not intended to replace advice given to you by your health care provider. Make sure you discuss any questions you have with your health care provider. Document Released: 12/30/2015 Document Revised: 08/22/2016 Document Reviewed: 10/04/2015 Elsevier Interactive Patient Education  2017 [employer] Prevention in the Home Falls can cause injuries. They can happen to people of all ages. There are many things you can do to make your home safe and to help prevent falls. What can I do on the outside of my home?  Regularly fix the edges of walkways and driveways and fix any cracks.  Remove anything that might make you trip as you walk through a door, such as a raised step or threshold.  Trim any bushes or trees on the path to your home.  Use bright outdoor lighting.  Clear any walking paths of anything that might make someone trip, such as rocks or tools.  Regularly check to see if handrails are loose or broken. Make sure that both sides of any steps have handrails.  Any raised decks and porches should have guardrails on the edges.  Have any leaves, snow, or ice cleared regularly.  Use sand or salt on walking paths during winter.  Clean up any spills in your garage right away. This includes oil or grease spills. What can I do in the bathroom?  Use night lights.  Install grab bars by the toilet and in the tub and shower. Do not use towel bars as grab bars.  Use non-skid mats or decals in the tub or shower.  If you need to sit down in the shower, use a plastic, non-slip stool.  Keep the floor dry. Clean up any water that  spills on the floor as soon as it happens.  Remove soap buildup in the tub or shower regularly.  Attach bath mats securely with double-sided non-slip rug tape.  Do not have throw rugs and other things on the floor that can make you trip. What can I do in the bedroom?  Use night lights.  Make sure that you have a light by your bed that is easy to reach.  Do not use any sheets or blankets that are too big for your bed. They should not hang down onto the floor.  Have a firm chair that has side arms. You can use this for support while you get dressed.  Do not have throw rugs and other things on the floor that can make you trip. What can I do in the kitchen?  Clean up any spills right away.  Avoid walking on wet floors.  Keep items that you use a lot in easy-to-reach places.  If you need to reach something above you, use a strong step stool that has a grab bar.  Keep electrical cords out of the way.  Do not use floor polish or wax that makes floors slippery. If  you must use wax, use non-skid floor wax.  Do not have throw rugs and other things on the floor that can make you trip. What can I do with my stairs?  Do not leave any items on the stairs.  Make sure that there are handrails on both sides of the stairs and use them. Fix handrails that are broken or loose. Make sure that handrails are as long as the stairways.  Check any carpeting to make sure that it is firmly attached to the stairs. Fix any carpet that is loose or worn.  Avoid having throw rugs at the top or bottom of the stairs. If you do have throw rugs, attach them to the floor with carpet tape.  Make sure that you have a light switch at the top of the stairs and the bottom of the stairs. If you do not have them, ask someone to add them for you. What else can I do to help prevent falls?  Wear shoes that:  Do not have high heels.  Have rubber bottoms.  Are comfortable and fit you well.  Are closed at the  toe. Do not wear sandals.  If you use a stepladder:  Make sure that it is fully opened. Do not climb a closed stepladder.  Make sure that both sides of the stepladder are locked into place.  Ask someone to hold it for you, if possible.  Clearly mark and make sure that you can see:  Any grab bars or handrails.  First and last steps.  Where the edge of each step is.  Use tools that help you move around (mobility aids) if they are needed. These include:  Canes.  Walkers.  Scooters.  Crutches.  Turn on the lights when you go into a dark area. Replace any light bulbs as soon as they burn out.  Set up your furniture so you have a clear path. Avoid moving your furniture around.  If any of your floors are uneven, fix them.  If there are any pets around you, be aware of where they are.  Review your medicines with your doctor. Some medicines can make you feel dizzy. This can increase your chance of falling. Ask your doctor what other things that you can do to help prevent falls. This information is not intended to replace advice given to you by your health care provider. Make sure you discuss any questions you have with your health care provider. Document Released: 09/29/2009 Document Revised: 05/10/2016 Document Reviewed: 01/07/2015 Elsevier Interactive Patient Education  2017 [employer].

## 2019-02-09 ENCOUNTER — Ambulatory Visit (INDEPENDENT_AMBULATORY_CARE_PROVIDER_SITE_OTHER): Payer: Medicare HMO | Admitting: Family Medicine

## 2019-02-09 VITALS — BP 138/76 | HR 55 | Temp 98.4°F | Resp 16 | Wt 159.0 lb

## 2019-02-09 DIAGNOSIS — E538 Deficiency of other specified B group vitamins: Secondary | ICD-10-CM

## 2019-02-09 DIAGNOSIS — M5412 Radiculopathy, cervical region: Secondary | ICD-10-CM | POA: Diagnosis not present

## 2019-02-09 DIAGNOSIS — I10 Essential (primary) hypertension: Secondary | ICD-10-CM

## 2019-02-09 DIAGNOSIS — M25511 Pain in right shoulder: Secondary | ICD-10-CM | POA: Diagnosis not present

## 2019-02-09 DIAGNOSIS — M5417 Radiculopathy, lumbosacral region: Secondary | ICD-10-CM | POA: Diagnosis not present

## 2019-02-09 MED ORDER — PREDNISONE 10 MG PO TABS: 10.0000 mg | ORAL_TABLET | Freq: Every day | ORAL | 0 refills | Status: DC

## 2019-02-09 NOTE — Progress Notes (Signed)
Julie Acevedo  MRN: [id number] DOB: 1950-12-22  Subjective:  HPI   The patient is a 68 year old female who presents for evaluation of right sided shoulder and arm pain.  She states she has been having the pain for about 3 months.  She is now having increased pain, decreased range of motion and inability to turn or roll her wrist without increased pain.  She has had no known trauma to her arm, shoulder or neck. The pain is starting to bother her significantly now.  Patient Active Problem List   Diagnosis Date Noted  . Iron deficiency anemia 11/21/2018  . Syncope 03/18/2018  . Anemia 03/18/2018  . Exophthalmos 02/22/2018  . Multiple thyroid nodules 02/22/2018  . 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 Pain at surgical site 08/30/2016  . Ventral incisional hernia 08/21/2016  . SBO (small bowel obstruction) ([place])   . Partial small bowel obstruction ([place]) 04/23/2016  . Adjustment disorder with mixed anxiety and depressed mood 04/23/2016  . Memory difficulties 09/01/2015  . Arthritis 06/11/2015  . Back pain, chronic 06/11/2015  . HLD (hyperlipidemia) 06/11/2015  . Cannot sleep 06/11/2015  . L-S radiculopathy 06/11/2015  . Arthritis of knee, degenerative 06/11/2015  . B12 deficiency 06/11/2015  . 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 DM2 (diabetes mellitus, type 2) ([place]) 11/15/2012  . HTN (hypertension) 01/27/2012  . Adiposity 11/21/2009  . Avitaminosis D 11/07/2009  . Narrowing of intervertebral disc space 07/25/2009  . Benign essential HTN 07/25/2009    Past Medical History:  Diagnosis Date  . Arthritis   . Diverticulitis   . DM (diabetes mellitus) ([place])    typ e 2  . HTN (hypertension)   . Hyperlipidemia   . Memory difficulties 09/01/2015  . Short-term memory loss     Social History   Socioeconomic History  . Marital status: Married    Spouse name: Not on file  . Number of children: 3  . Years of  education: 7  . Highest education level: Some college, no degree  Occupational History  . Occupation: IT consultant: RETIRED  Social Needs  . Financial resource strain: Not hard at all  . Food insecurity:    Worry: Never true    Inability: Never true  . Transportation needs:    Medical: No    Non-medical: No  Tobacco Use  . Smoking status: Never Smoker  . Smokeless tobacco: Never Used  Substance and Sexual Activity  . Alcohol use: No  . Drug use: No  . Sexual activity: Never  Lifestyle  . Physical activity:    Days per week: 0 days    Minutes per session: 0 min  . Stress: Only a little  Relationships  . Social connections:    Talks on phone: Patient refused    Gets together: Patient refused    Attends religious service: Patient refused    Active member of club or organization: Patient refused    Attends meetings of clubs or organizations: Patient refused    Relationship status: Patient refused  . Intimate partner violence:    Fear of current or ex partner: Patient refused    Emotionally abused: Patient refused    Physically abused: Patient refused    Forced sexual activity: Patient refused  Other Topics Concern  . Not on file  Social History Narrative   Lives with husband, daughter and granddaughter.;   Patient drinks about 3  cups of caffeine daily.   Patient is left handed.     Outpatient Encounter Medications as of 02/09/2019  Medication Sig Note  . ALPRAZolam (XANAX) 0.5 MG tablet Take 1 tablet (0.5 mg total) by mouth at bedtime as needed for anxiety. Take 1 PO, 1 hour before flying   . Elastic Bandages & Supports (COMPRESSION & SUPPORT GLOVES L) MISC    . folic acid (FOLVITE) 1 MG tablet Take 1 tablet (1 mg total) by mouth daily.   Marland Kitchen gabapentin (NEURONTIN) 100 MG capsule TAKE 2 CAPSULES (200 MG TOTAL) BY MOUTH 3 (THREE) TIMES DAILY.   . hydrochlorothiazide (HYDRODIURIL) 25 MG tablet TAKE 1 TABLET (25 MG TOTAL) BY MOUTH DAILY.   Marland Kitchen linaclotide (LINZESS)  290 MCG CAPS capsule Take 1 capsule (290 mcg total) by mouth daily before breakfast.   . Melatonin 10 MG TABS Take 10 mg by mouth at bedtime as needed (for sleep.).  01/27/2018: Patient reports she is taking 20 mg at night  . meloxicam (MOBIC) 15 MG tablet Take 15 mg by mouth daily.    . metFORMIN (GLUCOPHAGE) 500 MG tablet Take 1 tablet (500 mg total) by mouth daily with breakfast.   . metoprolol succinate (TOPROL-XL) 25 MG 24 hr tablet Take 25 mg by mouth daily.   . traZODone (DESYREL) 50 MG tablet Take 50 mg by mouth at bedtime as needed.    . [DISCONTINUED] hydrochlorothiazide (HYDRODIURIL) 25 MG tablet TAKE 1 TABLET (25 MG TOTAL) BY MOUTH DAILY.    No facility-administered encounter medications on file as of 02/09/2019.     Allergies  Allergen Reactions  . Lotrel [Amlodipine Besy-Benazepril Hcl] Anaphylaxis  . Contrast Media [Iodinated Diagnostic Agents] Swelling and Other (See Comments)    Reaction:  Neck swelling   . Niacin Hives  . Sulfa Antibiotics Hives    Review of Systems  Eyes: Negative.   Respiratory: Negative.   Cardiovascular: Negative.   Musculoskeletal: Positive for back pain, joint pain, myalgias and neck pain.  Neurological: Positive for focal weakness.       She feels weakness in the right shoulder.  Psychiatric/Behavioral: Negative.     Objective:  BP 138/76 (BP Location: Right Arm, Patient Position: Sitting, Cuff Size: Normal)   Pulse (!) 55   Temp 98.4 F (36.9 C) (Oral)   Resp 16   Wt 159 lb (72.1 kg)   SpO2 99%   BMI 28.17 kg/m   Physical Exam  Constitutional: She is oriented to person, place, and time and well-developed, well-nourished, and in no distress.  HENT:  Head: Normocephalic and atraumatic.  Eyes: Conjunctivae are normal. No scleral icterus.  Neck: No thyromegaly present.  Cardiovascular: Normal rate and regular rhythm.  Pulmonary/Chest: Effort normal and breath sounds normal.  Abdominal: Soft.  Musculoskeletal:        General:  Tenderness present. No deformity or edema.     Comments: She has tenderness anterior, posterior and top of the right shoulder.  She has difficulty with abduction due to pain apparently.   Neurological: She is alert and oriented to person, place, and time. Gait normal.  Patient is left-handed so she is stronger in the left arm.  Cannot discern if the pain is a limiting factor with abduction or if she actually has a weakness in the proximal arm right shoulder.  Skin: Skin is warm and dry.  Psychiatric: Mood, memory, affect and judgment normal.    Assessment and Plan :  1. Right shoulder pain, unspecified chronicity I  think this appears to be shoulder arthropathy.  Start with seeing Dr. Sabra Heck from orthopedics.  Due to no trauma deferred any x-rays to Dr. Sabra Heck. - predniSONE (DELTASONE) 10 MG tablet; Take 1 tablet (10 mg total) by mouth daily with breakfast. 6 day taper, start with 60 mg and decrease by 10 mg daily  Dispense: 21 tablet; Refill: 0 - Ambulatory referral to Orthopedic Surgery  2. Cervical radiculopathy May need cervical clinical work-up possible cervical radiculopathy versus brachial plexopathy less likely.  At this time defer to Dr. Sabra Heck.  3. Essential hypertension Controlled.  4. L-S radiculopathy History of low back disease  5. B12 deficiency  I have done the exam and reviewed the chart and it is accurate to the best of my knowledge. Development worker, community has been used and  any errors in dictation or transcription are unintentional. Miguel Aschoff M.D. [facility]

## 2019-02-16 ENCOUNTER — Ambulatory Visit (INDEPENDENT_AMBULATORY_CARE_PROVIDER_SITE_OTHER): Payer: Medicare HMO | Admitting: *Deleted

## 2019-02-16 DIAGNOSIS — R55 Syncope and collapse: Secondary | ICD-10-CM

## 2019-02-16 LAB — CUP PACEART REMOTE DEVICE CHECK
Date Time Interrogation Session: 20200301010624
Implantable Pulse Generator Implant Date: 20190405

## 2019-02-23 NOTE — Progress Notes (Signed)
[facility] Summary Report / Loop Recorder 

## 2019-02-25 ENCOUNTER — Emergency Department
Admission: EM | Admit: 2019-02-25 | Discharge: 2019-02-25 | Disposition: A | Discharge status: Home / Self Care | Payer: Medicare HMO | Attending: Emergency Medicine | Admitting: Emergency Medicine

## 2019-02-25 ENCOUNTER — Emergency Department: Payer: Medicare HMO

## 2019-02-25 ENCOUNTER — Other Ambulatory Visit: Payer: Self-pay

## 2019-02-25 ENCOUNTER — Encounter: Payer: Self-pay | Admitting: Emergency Medicine

## 2019-02-25 DIAGNOSIS — E119 Type 2 diabetes mellitus without complications: Secondary | ICD-10-CM | POA: Diagnosis not present

## 2019-02-25 DIAGNOSIS — Z7984 Long term (current) use of oral hypoglycemic drugs: Secondary | ICD-10-CM | POA: Diagnosis not present

## 2019-02-25 DIAGNOSIS — I10 Essential (primary) hypertension: Secondary | ICD-10-CM | POA: Insufficient documentation

## 2019-02-25 DIAGNOSIS — Z79899 Other long term (current) drug therapy: Secondary | ICD-10-CM | POA: Insufficient documentation

## 2019-02-25 DIAGNOSIS — M25511 Pain in right shoulder: Secondary | ICD-10-CM | POA: Diagnosis not present

## 2019-02-25 DIAGNOSIS — M19011 Primary osteoarthritis, right shoulder: Secondary | ICD-10-CM | POA: Insufficient documentation

## 2019-02-25 MED ORDER — DEXAMETHASONE SODIUM PHOSPHATE 10 MG/ML IJ SOLN
10.0000 mg | Freq: Once | INTRAMUSCULAR | Status: AC
  Administered 2019-02-25: 10 mg via INTRAMUSCULAR
  Filled 2019-02-25: qty 1

## 2019-02-25 NOTE — ED Triage Notes (Signed)
Pt to ED via POV c/o shoulder pain x 3-4 months. Pt is in NAD at this time.

## 2019-02-25 NOTE — ED Provider Notes (Signed)
[facility] Emergency Department Provider Note  ____________________________________________   None    (approximate)  I have reviewed the triage vital signs and the nursing notes.   HISTORY  Chief Complaint Shoulder Pain   HPI Julie Acevedo is a 68 y.o. female presents to the ED with complaint of right shoulder pain that has been hurting for approximately 4 months.  Patient denies any injury.  Pain is increased with range of motion.  She was seen at Dr. Alben Spittle office in February at which time she was given a prednisone Dosepak and referred to Dr. Sabra Heck.  She states that she has an appointment with him next week but cannot "take the pain.  When asked if she has taken any Tylenol or ibuprofen for her pain she states "I am not a pill popper".  She rates her pain as a 10/10.     Past Medical History:  Diagnosis Date  . Arthritis   . Diverticulitis   . DM (diabetes mellitus) ([place])    typ e 2  . HTN (hypertension)   . Hyperlipidemia   . Memory difficulties 09/01/2015  . Short-term memory loss     Patient Active Problem List   Diagnosis Date Noted  . Iron deficiency anemia 11/21/2018  . Syncope 03/18/2018  . Anemia 03/18/2018  . Exophthalmos 02/22/2018  . Multiple thyroid nodules 02/22/2018  . 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 Pain at surgical site 08/30/2016  . Ventral incisional hernia 08/21/2016  . SBO (small bowel obstruction) ([place])   . Partial small bowel obstruction ([place]) 04/23/2016  . Adjustment disorder with mixed anxiety and depressed mood 04/23/2016  . Memory difficulties 09/01/2015  . Arthritis 06/11/2015  . Back pain, chronic 06/11/2015  . HLD (hyperlipidemia) 06/11/2015  . Cannot sleep 06/11/2015  . L-S radiculopathy 06/11/2015  . Arthritis of knee, degenerative 06/11/2015  . B12 deficiency 06/11/2015  . 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 DM2 (diabetes  mellitus, type 2) ([place]) 11/15/2012  . HTN (hypertension) 01/27/2012  . Adiposity 11/21/2009  . Avitaminosis D 11/07/2009  . Narrowing of intervertebral disc space 07/25/2009  . Benign essential HTN 07/25/2009    Past Surgical History:  Procedure Laterality Date  . ABDOMINAL HYSTERECTOMY     due to endometriosis-1 ovary left  . BREAST EXCISIONAL BIOPSY Right 1971   neg  . CARDIAC CATHETERIZATION  2000   no significant CAD per note of Dr Caryl Comes in 2013   . CHOLECYSTECTOMY    . CYSTOCELE REPAIR N/A 03/19/2017   Procedure: ANTERIOR REPAIR (CYSTOCELE);  Surgeon: Bjorn Loser, MD;  [facility]: [facility];  Service: Urology;  Laterality: N/A;  . CYSTOSCOPY N/A 03/19/2017   Procedure: CYSTOSCOPY;  Surgeon: Bjorn Loser, MD;  [facility];  Service: Urology;  Laterality: N/A;  . EYE SURGERY    . FOOT SURGERY    . HAND SURGERY    . HEMICOLECTOMY    . INSERTION OF MESH N/A 08/21/2016   Procedure: INSERTION OF MESH;  Surgeon: Excell Seltzer, MD;  [facility];  Service: General;  Laterality: N/A;  . LAPAROSCOPIC LYSIS OF ADHESIONS N/A 08/21/2016   Procedure: LAPAROSCOPIC LYSIS OF ADHESIONS;  Surgeon: Excell Seltzer, MD;  [facility];  Service: General;  Laterality: N/A;  . LAPAROSCOPIC REMOVAL ABDOMINAL MASS    . LOOP RECORDER INSERTION N/A 03/21/2018   Procedure: LOOP RECORDER INSERTION;  Surgeon: Sanda Klein, MD;  Location: [place] [facility];  Service: Cardiovascular;  Laterality: N/A;  . LUMBAR [place]    . ROUX-EN-Y GASTRIC BYPASS  2010   Dr Hassell Done  . TONSILLECTOMY    . UPPER GI ENDOSCOPY  01/12/16   normal larynx, normal esophagus. gastric bypass with a normal-sized pouch and intact staple line, normal examined jejunum, otherwise exam was normal  . VENTRAL HERNIA REPAIR N/A 08/21/2016   Procedure: LAPAROSCOPIC VENTRAL HERNIA;  Surgeon: Excell Seltzer, MD;  [facility];  Service: General;  Laterality: N/A;    Prior to Admission medications   Medication Sig  Start Date End Date Taking? Authorizing Provider  ALPRAZolam Duanne Moron) 0.5 MG tablet Take 1 tablet (0.5 mg total) by mouth at bedtime as needed for anxiety. Take 1 PO, 1 hour before flying 12/02/18   Jerrol Banana., MD  Elastic Bandages & Supports (COMPRESSION & SUPPORT GLOVES L) [place]  08/19/18   [provider]  folic acid (FOLVITE) 1 MG tablet Take 1 tablet (1 mg total) by mouth daily. 03/21/18   Shelly Coss, MD  gabapentin (NEURONTIN) 100 MG capsule TAKE 2 CAPSULES (200 MG TOTAL) BY MOUTH 3 (THREE) TIMES DAILY. 11/17/18   Jerrol Banana., MD  hydrochlorothiazide (HYDRODIURIL) 25 MG tablet TAKE 1 TABLET (25 MG TOTAL) BY MOUTH DAILY. 11/24/18   Jerrol Banana., MD  linaclotide [facility]) 290 MCG CAPS capsule Take 1 capsule (290 mcg total) by mouth daily before breakfast. 03/17/18   Jerrol Banana., MD  Melatonin 10 MG TABS Take 10 mg by mouth at bedtime as needed (for sleep.).     [provider]  meloxicam (MOBIC) 15 MG tablet Take 15 mg by mouth daily.  08/13/17   [provider]  metFORMIN (GLUCOPHAGE) 500 MG tablet Take 1 tablet (500 mg total) by mouth daily with breakfast. 11/18/17   Jerrol Banana., MD  metoprolol succinate (TOPROL-XL) 25 MG 24 hr tablet Take 25 mg by mouth daily.    [provider]  traZODone (DESYREL) 50 MG tablet Take 50 mg by mouth at bedtime as needed.  12/14/18   [provider]    Allergies Lotrel [amlodipine besy-benazepril hcl]; Contrast media [iodinated diagnostic agents]; Niacin; and Sulfa antibiotics  Family History  Problem Relation Age of Onset  . Cancer Father 31       Lung  . Coronary artery disease Father 16  . COPD Mother   . Diabetes Mother   . Hypertension Mother   . Cancer Paternal Grandmother        Breast  . Breast cancer Paternal Grandmother   . Congestive Heart Failure Maternal Grandmother   . Emphysema Maternal Grandfather   . Heart attack Paternal Grandfather   .  Cancer Paternal Aunt        Breast  . Breast cancer Paternal Aunt 49  . Breast cancer Paternal Aunt 38    Social History Social History   Tobacco Use  . Smoking status: Never Smoker  . Smokeless tobacco: Never Used  Substance Use Topics  . Alcohol use: No  . Drug use: No    Review of Systems Constitutional: No fever/chills Cardiovascular: Denies chest pain. Respiratory: Denies shortness of breath. Gastrointestinal:  No nausea, no vomiting. Musculoskeletal: Positive for chronic right shoulder pain. Skin: Negative for rash. Neurological: Negative for  focal weakness or numbness. ___________________________________________   PHYSICAL EXAM:  VITAL SIGNS: ED Triage Vitals  Enc Vitals Group     BP 02/25/19 0835 120/63  Pulse Rate 02/25/19 0835 64     Resp 02/25/19 0835 18     Temp 02/25/19 0835 98.6 F (37 C)     Temp Source 02/25/19 0835 Oral     SpO2 02/25/19 0835 100 %     Weight --      Height --      Head Circumference --      Peak Flow --      Pain Score 02/25/19 0839 10     Pain Loc --      Pain Edu? --      Excl. in [place]? --     Constitutional: Alert and oriented. Well appearing and in no acute distress. Eyes: Conjunctivae are normal.  Head: Atraumatic. Neck: No stridor.   Musculoskeletal: Examination of the right shoulder is limited secondary to patient's willingness for it to be examined.  Patient was moderately tender to very light palpation both anterior and posteriorly.  Range of motion was restricted as patient refused to allow assessment.  Skin was intact and without discoloration.  Pulse distally was intact.  Patient was able to move digits without any difficulty. Neurologic:  Normal speech and language. No gross focal neurologic deficits are appreciated. No gait instability. Skin:  Skin is warm, dry and intact. No rash noted. Psychiatric: Mood and affect are normal. Speech and behavior are normal.  ____________________________________________    LABS (all labs ordered are listed, but only abnormal results are displayed)  Labs Reviewed - No data to display  RADIOLOGY  Official radiology report(s): Dg Shoulder Right  Result Date: 02/25/2019 CLINICAL DATA:  Pain for approximately 3 months EXAM: RIGHT SHOULDER - 2+ VIEW COMPARISON:  None. FINDINGS: Oblique, frontal, and Y scapular images obtained. There is no acute fracture or dislocation. There is mild generalized osteoarthritic change. No erosive change or intra-articular calcification. Visualized right lung clear. IMPRESSION: Mild generalized joint space narrowing consistent with osteoarthritis. No erosive change. No fracture or dislocation. Electronically Signed   By: Lowella Grip III M.D.   On: 02/25/2019 09:47    ____________________________________________   PROCEDURES  Procedure(s) performed (including Critical Care):  Procedures   ____________________________________________   INITIAL IMPRESSION / ASSESSMENT AND PLAN / ED COURSE  As part of my medical decision making, I reviewed the following data within the electronic MEDICAL RECORD NUMBER Notes from prior ED visits and [place] Controlled Substance Database  68 year old female presents to the ED with complaint of right shoulder pain for 4 months.  She has been seen by her PCP who is referred her to Dr. Sabra Heck in orthopedics and she has an appointment next week.  She states that she has not been taking any over-the-counter medication as she is "not a pill popper".  Patient was cursing at the provider.  She was uncooperative and her physical exam and difficult to evaluate.  X-ray revealed osteoarthritis and patient was made aware.  Patient was given an injection of Decadron 10 mg IM.  She is encouraged to keep her appointment with Dr. Sabra Heck and call Dr. Alben Spittle office if any medication is needed prior to her orthopedic appointment.  ____________________________________________   FINAL CLINICAL IMPRESSION(S) / ED  DIAGNOSES  Final diagnoses:  Osteoarthritis of right shoulder, unspecified osteoarthritis type     ED Discharge Orders    None       Note:  This document was prepared using Dragon voice recognition software and may include unintentional dictation errors.    Johnn Hai, PA-C 02/25/19 1446  Lavonia Drafts, MD 02/25/19 1454

## 2019-02-25 NOTE — Discharge Instructions (Addendum)
Keep your appointment with Dr. Earnestine Leys as already scheduled. You were given an injection today because you do not want to take pills.  You should see an improvement with your arthritis complaints.  Dr. Sabra Heck can possibly improve on your symptoms when you see him at your appointment.

## 2019-02-25 NOTE — ED Notes (Signed)
See triage note: Pt states that shoulder has been hurting for a few months but got worse last night and she is not able to raise her arm above her head.

## 2019-03-11 ENCOUNTER — Other Ambulatory Visit: Payer: Self-pay | Admitting: Family Medicine

## 2019-03-11 DIAGNOSIS — M5412 Radiculopathy, cervical region: Secondary | ICD-10-CM | POA: Diagnosis not present

## 2019-03-11 DIAGNOSIS — M7501 Adhesive capsulitis of right shoulder: Secondary | ICD-10-CM | POA: Diagnosis not present

## 2019-03-19 ENCOUNTER — Ambulatory Visit (INDEPENDENT_AMBULATORY_CARE_PROVIDER_SITE_OTHER): Payer: Medicare HMO | Admitting: *Deleted

## 2019-03-19 ENCOUNTER — Inpatient Hospital Stay: Payer: Medicare HMO | Attending: Oncology

## 2019-03-19 ENCOUNTER — Other Ambulatory Visit: Payer: Self-pay

## 2019-03-19 DIAGNOSIS — D509 Iron deficiency anemia, unspecified: Secondary | ICD-10-CM

## 2019-03-19 DIAGNOSIS — R55 Syncope and collapse: Secondary | ICD-10-CM | POA: Diagnosis not present

## 2019-03-19 LAB — IRON AND TIBC
Iron: 55 ug/dL (ref 28–170)
Saturation Ratios: 20 % (ref 10.4–31.8)
TIBC: 277 ug/dL (ref 250–450)
UIBC: 222 ug/dL

## 2019-03-19 LAB — CBC WITH DIFFERENTIAL/PLATELET
Abs Immature Granulocytes: 0.05 10*3/uL (ref 0.00–0.07)
Basophils Absolute: 0 10*3/uL (ref 0.0–0.1)
Basophils Relative: 0 %
Eosinophils Absolute: 0.1 10*3/uL (ref 0.0–0.5)
Eosinophils Relative: 1 %
HCT: 35.6 % — ABNORMAL LOW (ref 36.0–46.0)
Hemoglobin: 11.2 g/dL — ABNORMAL LOW (ref 12.0–15.0)
Immature Granulocytes: 1 %
Lymphocytes Relative: 19 %
Lymphs Abs: 2 10*3/uL (ref 0.7–4.0)
MCH: 27.3 pg (ref 26.0–34.0)
MCHC: 31.5 g/dL (ref 30.0–36.0)
MCV: 86.6 fL (ref 80.0–100.0)
Monocytes Absolute: 0.7 10*3/uL (ref 0.1–1.0)
Monocytes Relative: 6 %
Neutro Abs: 7.8 10*3/uL — ABNORMAL HIGH (ref 1.7–7.7)
Neutrophils Relative %: 73 %
Platelets: 329 10*3/uL (ref 150–400)
RBC: 4.11 MIL/uL (ref 3.87–5.11)
RDW: 16.9 % — ABNORMAL HIGH (ref 11.5–15.5)
WBC: 10.6 10*3/uL — ABNORMAL HIGH (ref 4.0–10.5)
nRBC: 0 % (ref 0.0–0.2)

## 2019-03-19 LAB — FERRITIN: Ferritin: 97 ng/mL (ref 11–307)

## 2019-03-19 NOTE — Progress Notes (Signed)
[place]  Telephone:(336) [phone] Fax:(336) [phone]  ID: Julie Acevedo OB: 27-Feb-1951  MR#: [id number]  NGE#:[id number]  Patient Care Team: Jerrol Banana., MD as PCP - General (Family Medicine) Yolonda Kida, MD as PCP - Cardiology (Cardiology) Lorelee Cover., MD as Consulting Physician (Ophthalmology) Bjorn Loser, MD as Consulting Physician (Urology) Bary Castilla Forest Gleason, MD as Consulting Physician (General Surgery)  Virtual Visit via Telephone Note  I connected with Julie Acevedo on March 20, 2019 at 11:30 AM EDT by telephone and verified that I am speaking with the correct person using two identifiers.   I discussed the limitations, risks, security and privacy concerns of performing an evaluation and management service by telephone and the availability of in person appointments. I also discussed with the patient that there may be a patient responsible charge related to this service. The patient expressed understanding and agreed to proceed.   CHIEF COMPLAINT: Iron deficiency anemia.  INTERVAL HISTORY: Patient agreed to evaluation and discussion of her laboratory work via telephone.  Patient continues to have mild, chronic weakness and fatigue.  She also complains of "always being cold". She otherwise feels well.  She has no neurologic complaints.  She denies any recent fevers or illnesses.  She has a fair appetite, but denies weight loss.  She has no chest pain, cough, hemoptysis, or shortness of breath.  She denies any nausea, vomiting, constipation, or diarrhea.  She denies any melena or hematochezia.  She has no urinary complaints.  Patient feels at her baseline offers no further specific complaints today.  REVIEW OF SYSTEMS:   Review of Systems  Constitutional: Positive for malaise/fatigue. Negative for fever and weight loss.  Respiratory: Negative.  Negative for cough and shortness of breath.   Cardiovascular: Negative.  Negative for chest  pain and leg swelling.  Gastrointestinal: Negative.  Negative for abdominal pain, blood in stool and melena.  Genitourinary: Negative.  Negative for hematuria.  Musculoskeletal: Negative.  Negative for back pain.  Skin: Negative.  Negative for rash.  Neurological: Negative.  Negative for dizziness, focal weakness, weakness and headaches.  Psychiatric/Behavioral: Negative.  The patient is not nervous/anxious.     As per HPI. Otherwise, a complete review of systems is negative.  PAST MEDICAL HISTORY: Past Medical History:  Diagnosis Date  . Arthritis   . Diverticulitis   . DM (diabetes mellitus) ([place])    typ e 2  . HTN (hypertension)   . Hyperlipidemia   . Memory difficulties 09/01/2015  . Short-term memory loss     PAST SURGICAL HISTORY: Past Surgical History:  Procedure Laterality Date  . ABDOMINAL HYSTERECTOMY     due to endometriosis-1 ovary left  . BREAST EXCISIONAL BIOPSY Right 1971   neg  . CARDIAC CATHETERIZATION  2000   no significant CAD per note of Dr Caryl Comes in 2013   . CHOLECYSTECTOMY    . CYSTOCELE REPAIR N/A 03/19/2017   Procedure: ANTERIOR REPAIR (CYSTOCELE);  Surgeon: Bjorn Loser, MD;  [facility];  Service: Urology;  Laterality: N/A;  . CYSTOSCOPY N/A 03/19/2017   Procedure: CYSTOSCOPY;  Surgeon: Bjorn Loser, MD;  [facility];  Service: Urology;  Laterality: N/A;  . EYE SURGERY    . FOOT SURGERY    . HAND SURGERY    . HEMICOLECTOMY    . INSERTION OF MESH N/A 08/21/2016   Procedure: INSERTION OF MESH;  Surgeon: Excell Seltzer, MD;  [facility];  Service: General;  Laterality: N/A;  .  LAPAROSCOPIC LYSIS OF ADHESIONS N/A 08/21/2016   Procedure: LAPAROSCOPIC LYSIS OF ADHESIONS;  Surgeon: Excell Seltzer, MD;  [facility];  Service: General;  Laterality: N/A;  . LAPAROSCOPIC REMOVAL ABDOMINAL MASS    . LOOP RECORDER INSERTION N/A 03/21/2018   Procedure: LOOP RECORDER INSERTION;  Surgeon: Sanda Klein, MD;  Location: [place] [facility];  Service: Cardiovascular;  Laterality: N/A;  . LUMBAR [place]    . ROUX-EN-Y GASTRIC BYPASS  2010   Dr Hassell Done  . TONSILLECTOMY    . UPPER GI ENDOSCOPY  01/12/16   normal larynx, normal esophagus. gastric bypass with a normal-sized pouch and intact staple line, normal examined jejunum, otherwise exam was normal  . VENTRAL HERNIA REPAIR N/A 08/21/2016   Procedure: LAPAROSCOPIC VENTRAL HERNIA;  Surgeon: Excell Seltzer, MD;  [facility];  Service: General;  Laterality: N/A;    FAMILY HISTORY: Family History  Problem Relation Age of Onset  . Cancer Father 74       Lung  . Coronary artery disease Father 64  . COPD Mother   . Diabetes Mother   . Hypertension Mother   . Cancer Paternal Grandmother        Breast  . Breast cancer Paternal Grandmother   . Congestive Heart Failure Maternal Grandmother   . Emphysema Maternal Grandfather   . Heart attack Paternal Grandfather   . Cancer Paternal Aunt        Breast  . Breast cancer Paternal Aunt 61  . Breast cancer Paternal Aunt 92    ADVANCED DIRECTIVES (Y/N):  N  HEALTH MAINTENANCE: Social History   Tobacco Use  . Smoking status: Never Smoker  . Smokeless tobacco: Never Used  Substance Use Topics  . Alcohol use: No  . Drug use: No     Colonoscopy:  PAP:  Bone density:  Lipid panel:  Allergies  Allergen Reactions  . Lotrel [Amlodipine Besy-Benazepril Hcl] Anaphylaxis  . Contrast Media [Iodinated Diagnostic Agents] Swelling and Other (See Comments)    Reaction:  Neck swelling   . Niacin Hives  . Sulfa Antibiotics Hives    Current Outpatient Medications  Medication Sig Dispense Refill  . ALPRAZolam (XANAX) 0.25 MG tablet Take 1 tablet by mouth as needed.    . ALPRAZolam (XANAX) 0.5 MG tablet Take 1 tablet (0.5 mg total) by mouth at bedtime as needed for anxiety. Take 1 PO, 1 hour before flying 5 tablet 0  . amLODipine (NORVASC) 10 MG tablet Take 1 tablet by mouth 1 day or 1 dose.    Marland Kitchen amoxicillin (AMOXIL)  500 MG capsule Take 1 capsule by mouth 2 (two) times daily.    . bisacodyl (DULCOLAX) 5 MG EC tablet bisacodyl 5 mg tablet,delayed release  TAKE AS DIRECTED    . diclofenac (VOLTAREN) 75 MG EC tablet diclofenac sodium 75 mg tablet,delayed release  Take 1 tablet twice a day by oral route.    Regino Schultze Bandages & Supports (COMPRESSION & SUPPORT GLOVES L) MISC     . folic acid (FOLVITE) 1 MG tablet Take 1 tablet (1 mg total) by mouth daily. 30 tablet 0  . gabapentin (NEURONTIN) 100 MG capsule TAKE 2 CAPSULES (200 MG TOTAL) BY MOUTH 3 (THREE) TIMES DAILY. 540 capsule 1  . hydrochlorothiazide (HYDRODIURIL) 25 MG tablet TAKE 1 TABLET (25 MG TOTAL) BY MOUTH DAILY. 90 tablet 3  . HYDROcodone-acetaminophen (NORCO) 10-325 MG tablet hydrocodone 10 mg-acetaminophen 325 mg tablet    . LINZESS 290 MCG CAPS capsule TAKE  1 CAPSULE (290 MCG TOTAL) BY MOUTH DAILY BEFORE BREAKFAST. 90 capsule 3  . Melatonin 10 MG TABS Take 10 mg by mouth at bedtime as needed (for sleep.).     Marland Kitchen meloxicam (MOBIC) 15 MG tablet Take 15 mg by mouth daily.     . metFORMIN (GLUCOPHAGE) 500 MG tablet Take 1 tablet (500 mg total) by mouth daily with breakfast. 90 tablet 0  . methocarbamol (ROBAXIN) 500 MG tablet methocarbamol 500 mg tablet    . metoprolol succinate (TOPROL-XL) 25 MG 24 hr tablet Take 25 mg by mouth daily.    Marland Kitchen PEG 3350-KCl-NaBcb-NaCl-NaSulf (PEG 3350/ELECTROLYTES) 240 g SOLR peg 3350 240 gram-electrolytes 22.72 gram-6.72 g-5.84 g powdr for soln    . potassium chloride SA (KLOR-CON M20) 20 MEQ tablet Klor-Con M20 mEq tablet,extended release    . predniSONE (DELTASONE) 10 MG tablet prednisone 10 mg tablet    . traMADol (ULTRAM) 50 MG tablet tramadol 50 mg tablet    . traZODone (DESYREL) 50 MG tablet Take 50 mg by mouth at bedtime as needed.      No current facility-administered medications for this visit.     OBJECTIVE: There were no vitals filed for this visit.   There is no height or weight on file to calculate BMI.     ECOG FS:0 - Asymptomatic   LAB RESULTS:  Lab Results  Component Value Date   NA 141 10/13/2018   K 4.5 10/13/2018   CL 107 10/13/2018   CO2 25 10/13/2018   GLUCOSE 136 (H) 10/13/2018   BUN 24 (H) 10/13/2018   CREATININE 1.48 (H) 10/13/2018   CALCIUM 8.7 (L) 10/13/2018   PROT 6.7 06/26/2018   ALBUMIN 3.6 06/26/2018   AST 11 06/26/2018   ALT 8 06/26/2018   ALKPHOS 150 (H) 06/26/2018   BILITOT 0.2 06/26/2018   GFRNONAA 36 (L) 10/13/2018   GFRAA 42 (L) 10/13/2018    Lab Results  Component Value Date   WBC 10.6 (H) 03/19/2019   NEUTROABS 7.8 (H) 03/19/2019   HGB 11.2 (L) 03/19/2019   HCT 35.6 (L) 03/19/2019   MCV 86.6 03/19/2019   PLT 329 03/19/2019    Lab Results  Component Value Date   IRON 55 03/19/2019   TIBC 277 03/19/2019   IRONPCTSAT 20 03/19/2019   Lab Results  Component Value Date   FERRITIN 97 03/19/2019    STUDIES: Dg Shoulder Right  Result Date: 02/25/2019 CLINICAL DATA:  Pain for approximately 3 months EXAM: RIGHT SHOULDER - 2+ VIEW COMPARISON:  None. FINDINGS: Oblique, frontal, and Y scapular images obtained. There is no acute fracture or dislocation. There is mild generalized osteoarthritic change. No erosive change or intra-articular calcification. Visualized right lung clear. IMPRESSION: Mild generalized joint space narrowing consistent with osteoarthritis. No erosive change. No fracture or dislocation. Electronically Signed   By: Lowella Grip III M.D.   On: 02/25/2019 09:47    ASSESSMENT: Iron deficiency anemia.  PLAN:   1.  Iron deficiency anemia: Patient's hemoglobin has significantly improved and now is 11.2.  Her iron stores are now within normal limits.  Previously, all of her other laboratory work including hemolysis labs, B12, folate, and SPEP were negative or within normal limits.  Patient had a colonoscopy on September 05, 2018 that was reported as normal.  Patient does not require additional IV Feraheme today.  She last received  treatment on December 23, 2018.  No intervention is needed.  Return to clinic in 4 months with repeat laboratory work  and further evaluation.      I discussed the assessment and treatment plan with the patient. The patient was provided an opportunity to ask questions and all were answered. The patient agreed with the plan and demonstrated an understanding of the instructions.   The patient was advised to call back or seek an in-person evaluation if the symptoms worsen or if the condition fails to improve as anticipated.  I provided 15 minutes of non-face-to-face time during this encounter.   Patient expressed understanding and was in agreement with this plan. She also understands that She can call clinic at any time with any questions, concerns, or complaints.    Lloyd Huger, MD   03/21/2019 8:40 AM

## 2019-03-20 ENCOUNTER — Encounter: Payer: Self-pay | Admitting: Oncology

## 2019-03-20 ENCOUNTER — Other Ambulatory Visit: Payer: Self-pay

## 2019-03-20 ENCOUNTER — Inpatient Hospital Stay (HOSPITAL_BASED_OUTPATIENT_CLINIC_OR_DEPARTMENT_OTHER): Payer: Medicare HMO | Admitting: Oncology

## 2019-03-20 DIAGNOSIS — D509 Iron deficiency anemia, unspecified: Secondary | ICD-10-CM

## 2019-03-20 DIAGNOSIS — M6530 Trigger finger, unspecified finger: Secondary | ICD-10-CM | POA: Insufficient documentation

## 2019-03-20 LAB — CUP PACEART REMOTE DEVICE CHECK
Date Time Interrogation Session: 20200403014043
Implantable Pulse Generator Implant Date: 20190405

## 2019-03-20 NOTE — Progress Notes (Signed)
Patient stated that she continues to feeling cold and tired all the time. Patient would like to know why her iron is still not right.

## 2019-03-23 ENCOUNTER — Inpatient Hospital Stay: Payer: Medicare HMO

## 2019-03-23 ENCOUNTER — Other Ambulatory Visit: Payer: Medicare HMO

## 2019-03-23 ENCOUNTER — Ambulatory Visit: Payer: Medicare HMO | Admitting: Oncology

## 2019-03-26 NOTE — Progress Notes (Signed)
[facility] Summary Report / Loop Recorder 

## 2019-04-06 ENCOUNTER — Emergency Department: Payer: Medicare HMO

## 2019-04-06 ENCOUNTER — Other Ambulatory Visit: Payer: Self-pay

## 2019-04-06 ENCOUNTER — Telehealth: Payer: Self-pay | Admitting: *Deleted

## 2019-04-06 ENCOUNTER — Emergency Department
Admission: EM | Admit: 2019-04-06 | Discharge: 2019-04-06 | Disposition: A | Discharge status: Home / Self Care | Payer: Medicare HMO | Attending: Emergency Medicine | Admitting: Emergency Medicine

## 2019-04-06 ENCOUNTER — Encounter: Payer: Self-pay | Admitting: Emergency Medicine

## 2019-04-06 DIAGNOSIS — N390 Urinary tract infection, site not specified: Secondary | ICD-10-CM | POA: Diagnosis not present

## 2019-04-06 DIAGNOSIS — K6389 Other specified diseases of intestine: Secondary | ICD-10-CM | POA: Diagnosis not present

## 2019-04-06 DIAGNOSIS — R112 Nausea with vomiting, unspecified: Secondary | ICD-10-CM | POA: Diagnosis not present

## 2019-04-06 DIAGNOSIS — Z7984 Long term (current) use of oral hypoglycemic drugs: Secondary | ICD-10-CM | POA: Diagnosis not present

## 2019-04-06 DIAGNOSIS — Z79899 Other long term (current) drug therapy: Secondary | ICD-10-CM | POA: Diagnosis not present

## 2019-04-06 DIAGNOSIS — K529 Noninfective gastroenteritis and colitis, unspecified: Secondary | ICD-10-CM | POA: Insufficient documentation

## 2019-04-06 DIAGNOSIS — I10 Essential (primary) hypertension: Secondary | ICD-10-CM | POA: Diagnosis not present

## 2019-04-06 DIAGNOSIS — E119 Type 2 diabetes mellitus without complications: Secondary | ICD-10-CM | POA: Diagnosis not present

## 2019-04-06 LAB — COMPREHENSIVE METABOLIC PANEL WITH GFR
ALT: 12 U/L (ref 0–44)
AST: 16 U/L (ref 15–41)
Albumin: 3.6 g/dL (ref 3.5–5.0)
Alkaline Phosphatase: 181 U/L — ABNORMAL HIGH (ref 38–126)
Anion gap: 9 (ref 5–15)
BUN: 29 mg/dL — ABNORMAL HIGH (ref 8–23)
CO2: 27 mmol/L (ref 22–32)
Calcium: 9.1 mg/dL (ref 8.9–10.3)
Chloride: 104 mmol/L (ref 98–111)
Creatinine, Ser: 1.64 mg/dL — ABNORMAL HIGH (ref 0.44–1.00)
GFR calc Af Amer: 38 mL/min — ABNORMAL LOW
GFR calc non Af Amer: 32 mL/min — ABNORMAL LOW
Glucose, Bld: 81 mg/dL (ref 70–99)
Potassium: 3 mmol/L — ABNORMAL LOW (ref 3.5–5.1)
Sodium: 140 mmol/L (ref 135–145)
Total Bilirubin: 0.5 mg/dL (ref 0.3–1.2)
Total Protein: 7.5 g/dL (ref 6.5–8.1)

## 2019-04-06 LAB — CBC
HCT: 38.7 % (ref 36.0–46.0)
Hemoglobin: 12 g/dL (ref 12.0–15.0)
MCH: 27.5 pg (ref 26.0–34.0)
MCHC: 31 g/dL (ref 30.0–36.0)
MCV: 88.8 fL (ref 80.0–100.0)
Platelets: 338 10*3/uL (ref 150–400)
RBC: 4.36 MIL/uL (ref 3.87–5.11)
RDW: 13.9 % (ref 11.5–15.5)
WBC: 8.3 10*3/uL (ref 4.0–10.5)
nRBC: 0 % (ref 0.0–0.2)

## 2019-04-06 LAB — URINALYSIS, COMPLETE (UACMP) WITH MICROSCOPIC
Bilirubin Urine: NEGATIVE
Glucose, UA: NEGATIVE mg/dL
Ketones, ur: NEGATIVE mg/dL
Nitrite: POSITIVE — AB
Protein, ur: NEGATIVE mg/dL
Specific Gravity, Urine: 1.021 (ref 1.005–1.030)
WBC, UA: >50 WBC/hpf — ABNORMAL HIGH (ref 0–5)
pH: 5 (ref 5.0–8.0)

## 2019-04-06 LAB — LIPASE, BLOOD: Lipase: 27 U/L (ref 11–51)

## 2019-04-06 MED ORDER — SODIUM CHLORIDE 0.9 % IV SOLN
1.0000 g | Freq: Once | INTRAVENOUS | Status: AC
  Administered 2019-04-06: 1 g via INTRAVENOUS
  Filled 2019-04-06: qty 10

## 2019-04-06 MED ORDER — CEPHALEXIN 500 MG PO CAPS: 500.0000 mg | ORAL_CAPSULE | Freq: Two times a day (BID) | ORAL | 0 refills | Status: DC

## 2019-04-06 MED ORDER — ONDANSETRON 4 MG PO TBDP: 4.0000 mg | ORAL_TABLET | Freq: Three times a day (TID) | ORAL | 0 refills | Status: DC | PRN

## 2019-04-06 MED ORDER — ONDANSETRON HCL 4 MG/2ML IJ SOLN
4.0000 mg | Freq: Once | INTRAMUSCULAR | Status: AC
  Administered 2019-04-06: 4 mg via INTRAVENOUS
  Filled 2019-04-06: qty 2

## 2019-04-06 MED ORDER — SODIUM CHLORIDE 0.9 % IV BOLUS
1000.0000 mL | Freq: Once | INTRAVENOUS | Status: AC
  Administered 2019-04-06: 1000 mL via INTRAVENOUS

## 2019-04-06 MED ORDER — SODIUM CHLORIDE 0.9% FLUSH
3.0000 mL | Freq: Once | INTRAVENOUS | Status: AC
  Administered 2019-04-06: 3 mL via INTRAVENOUS

## 2019-04-06 NOTE — Telephone Encounter (Signed)
Noted. Agree that she needs to be seen at the ER. Please review. Thanks!

## 2019-04-06 NOTE — ED Provider Notes (Signed)
[facility] Emergency Department Provider Note  Time seen: 3:57 PM  I have reviewed the triage vital signs and the nursing notes.   HISTORY  Chief Complaint Emesis   HPI Julie Acevedo is a 68 y.o. female with a past medical history of arthritis, diverticulitis, diabetes, hypertension, hyperlipidemia, presents to the emergency department for nausea and vomiting.  According to the patient over the past 1 to 2 weeks she has been feeling nauseated, states it has been much worse over the past 48 hours with vomiting today.  States very mild upper abdominal discomfort, also states for the past 3 to 4 days she has been experiencing diarrhea.  Denies any known fever.  No cough.  Patient's main complaint is feeling nauseated currently.   Past Medical History:  Diagnosis Date  . Arthritis   . Diverticulitis   . DM (diabetes mellitus) ([place])    typ e 2  . HTN (hypertension)   . Hyperlipidemia   . Memory difficulties 09/01/2015  . Short-term memory loss     Patient Active Problem List   Diagnosis Date Noted  . Acquired trigger finger 03/20/2019  . Iron deficiency anemia 11/21/2018  . Syncope 03/18/2018  . Anemia 03/18/2018  . Exophthalmos 02/22/2018  . Multiple thyroid nodules 02/22/2018  . 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 Pain at surgical site 08/30/2016  . Ventral incisional hernia 08/21/2016  . SBO (small bowel obstruction) ([place])   . Partial small bowel obstruction ([place]) 04/23/2016  . Adjustment disorder with mixed anxiety and depressed mood 04/23/2016  . Memory difficulties 09/01/2015  . Arthritis 06/11/2015  . Back pain, chronic 06/11/2015  . HLD (hyperlipidemia) 06/11/2015  . Cannot sleep 06/11/2015  . L-S radiculopathy 06/11/2015  . Arthritis of knee, degenerative 06/11/2015  . B12 deficiency 06/11/2015  . 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 DM2 (diabetes mellitus, type 2) ([place])  11/15/2012  . HTN (hypertension) 01/27/2012  . Adiposity 11/21/2009  . Avitaminosis D 11/07/2009  . Narrowing of intervertebral disc space 07/25/2009  . Benign essential HTN 07/25/2009    Past Surgical History:  Procedure Laterality Date  . ABDOMINAL HYSTERECTOMY     due to endometriosis-1 ovary left  . BREAST EXCISIONAL BIOPSY Right 1971   neg  . CARDIAC CATHETERIZATION  2000   no significant CAD per note of Dr Caryl Comes in 2013   . CHOLECYSTECTOMY    . CYSTOCELE REPAIR N/A 03/19/2017   Procedure: ANTERIOR REPAIR (CYSTOCELE);  Surgeon: Bjorn Loser, MD;  Location: [facility];  Service: Urology;  Laterality: N/A;  . CYSTOSCOPY N/A 03/19/2017   Procedure: CYSTOSCOPY;  Surgeon: Bjorn Loser, MD;  Location: [facility];  Service: Urology;  Laterality: N/A;  . EYE SURGERY    . FOOT SURGERY    . HAND SURGERY    . HEMICOLECTOMY    . INSERTION OF MESH N/A 08/21/2016   Procedure: INSERTION OF MESH;  Surgeon: Excell Seltzer, MD;  Location: [facility];  Service: General;  Laterality: N/A;  . LAPAROSCOPIC LYSIS OF ADHESIONS N/A 08/21/2016   Procedure: LAPAROSCOPIC LYSIS OF ADHESIONS;  Surgeon: Excell Seltzer, MD;  Location: [facility];  Service: General;  Laterality: N/A;  . LAPAROSCOPIC REMOVAL ABDOMINAL MASS    . LOOP RECORDER INSERTION N/A 03/21/2018   Procedure: LOOP RECORDER INSERTION;  Surgeon: Sanda Klein, MD;  Location: [place] [facility];  Service: Cardiovascular;  Laterality: N/A;  . LUMBAR [place]    . ROUX-EN-Y  GASTRIC BYPASS  2010   Dr Hassell Done  . TONSILLECTOMY    . UPPER GI ENDOSCOPY  01/12/16   normal larynx, normal esophagus. gastric bypass with a normal-sized pouch and intact staple line, normal examined jejunum, otherwise exam was normal  . VENTRAL HERNIA REPAIR N/A 08/21/2016   Procedure: LAPAROSCOPIC VENTRAL HERNIA;  Surgeon: Excell Seltzer, MD;  Location: [facility];  Service: General;  Laterality: N/A;    Prior to Admission medications   Medication Sig Start Date End Date  Taking? Authorizing Provider  ALPRAZolam Duanne Moron) 0.25 MG tablet Take 1 tablet by mouth as needed.    [provider]  ALPRAZolam Duanne Moron) 0.5 MG tablet Take 1 tablet (0.5 mg total) by mouth at bedtime as needed for anxiety. Take 1 PO, 1 hour before flying 12/02/18   Jerrol Banana., MD  amLODipine (NORVASC) 10 MG tablet Take 1 tablet by mouth 1 day or 1 dose.    [provider]  amoxicillin (AMOXIL) 500 MG capsule Take 1 capsule by mouth 2 (two) times daily.    [provider]  bisacodyl (DULCOLAX) 5 MG EC tablet bisacodyl 5 mg tablet,delayed release  TAKE AS DIRECTED    [provider]  diclofenac (VOLTAREN) 75 MG EC tablet diclofenac sodium 75 mg tablet,delayed release  Take 1 tablet twice a day by oral route.    [provider]  Elastic Bandages & Supports (COMPRESSION & SUPPORT GLOVES L) [place]  08/19/18   [provider]  folic acid (FOLVITE) 1 MG tablet Take 1 tablet (1 mg total) by mouth daily. 03/21/18   Shelly Coss, MD  gabapentin (NEURONTIN) 100 MG capsule TAKE 2 CAPSULES (200 MG TOTAL) BY MOUTH 3 (THREE) TIMES DAILY. 11/17/18   Jerrol Banana., MD  hydrochlorothiazide (HYDRODIURIL) 25 MG tablet TAKE 1 TABLET (25 MG TOTAL) BY MOUTH DAILY. 11/24/18   Jerrol Banana., MD  HYDROcodone-acetaminophen [facility]) 10-325 MG tablet hydrocodone 10 mg-acetaminophen 325 mg tablet    [provider]  LINZESS 290 MCG CAPS capsule TAKE 1 CAPSULE (290 MCG TOTAL) BY MOUTH DAILY BEFORE BREAKFAST. 03/11/19   Jerrol Banana., MD  Melatonin 10 MG TABS Take 10 mg by mouth at bedtime as needed (for sleep.).     [provider]  meloxicam (MOBIC) 15 MG tablet Take 15 mg by mouth daily.  08/13/17   [provider]  metFORMIN (GLUCOPHAGE) 500 MG tablet Take 1 tablet (500 mg total) by mouth daily with breakfast. 11/18/17   Jerrol Banana., MD  methocarbamol (ROBAXIN) 500 MG tablet methocarbamol 500 mg tablet     [provider]  metoprolol succinate (TOPROL-XL) 25 MG 24 hr tablet Take 25 mg by mouth daily.    [provider]  PEG 3350-KCl-NaBcb-NaCl-NaSulf (PEG 3350/ELECTROLYTES) 240 g SOLR peg 3350 240 gram-electrolytes 22.72 gram-6.72 g-5.84 g powdr for soln    [provider]  potassium chloride SA (KLOR-CON M20) 20 MEQ tablet Klor-Con M20 mEq tablet,extended release    [provider]  predniSONE (DELTASONE) 10 MG tablet prednisone 10 mg tablet    [provider]  traMADol (ULTRAM) 50 MG tablet tramadol 50 mg tablet    [provider]  traZODone (DESYREL) 50 MG tablet Take 50 mg by mouth at bedtime as needed.  12/14/18   [provider]    Allergies  Allergen Reactions  . Lotrel [Amlodipine Besy-Benazepril Hcl] Anaphylaxis  . Contrast Media [Iodinated Diagnostic Agents] Swelling and Other (See Comments)  Reaction:  Neck swelling   . Niacin Hives  . Sulfa Antibiotics Hives    Family History  Problem Relation Age of Onset  . Cancer Father 23       Lung  . Coronary artery disease Father 36  . COPD Mother   . Diabetes Mother   . Hypertension Mother   . Cancer Paternal Grandmother        Breast  . Breast cancer Paternal Grandmother   . Congestive Heart Failure Maternal Grandmother   . Emphysema Maternal Grandfather   . Heart attack Paternal Grandfather   . Cancer Paternal Aunt        Breast  . Breast cancer Paternal Aunt 25  . Breast cancer Paternal Aunt 41    Social History Social History   Tobacco Use  . Smoking status: Never Smoker  . Smokeless tobacco: Never Used  Substance Use Topics  . Alcohol use: No  . Drug use: No    Review of Systems Constitutional: Negative for fever. ENT: Negative for recent illness/congestion Cardiovascular: Negative for chest pain. Respiratory: Negative for shortness of breath. Gastrointestinal: Mild upper abdominal discomfort.  Positive for nausea vomiting.  Positive for  diarrhea. Genitourinary: Negative for urinary compaints Musculoskeletal: Negative for musculoskeletal complaints Skin: Negative for skin complaints  Neurological: Negative for headache All other ROS negative  ____________________________________________   PHYSICAL EXAM:  VITAL SIGNS: ED Triage Vitals  Enc Vitals Group     BP 04/06/19 1530 122/73     Pulse Rate 04/06/19 1530 76     Resp 04/06/19 1530 18     Temp 04/06/19 1530 98 F (36.7 C)     Temp Source 04/06/19 1530 Oral     SpO2 04/06/19 1530 94 %     Weight 04/06/19 1532 150 lb (68 kg)     Height 04/06/19 1532 5\' 3"  (1.6 m)     Head Circumference --      Peak Flow --      Pain Score 04/06/19 1532 0     Pain Loc --      Pain Edu? --      Excl. in [place]? --    Constitutional: Alert and oriented. Well appearing and in no distress. Eyes: Normal exam ENT      Head: Normocephalic and atraumatic      Mouth/Throat: Mucous membranes are moist. Cardiovascular: Normal rate, regular rhythm Respiratory: Normal respiratory effort without tachypnea nor retractions. Breath sounds are clear Gastrointestinal: Soft, mild epigastric tenderness otherwise fairly benign abdominal exam.  Hyperactive bowel sounds throughout. Musculoskeletal: Nontender with normal range of motion in all extremities Neurologic:  Normal speech and language. No gross focal neurologic deficits  Skin:  Skin is warm, dry and intact.  Psychiatric: Mood and affect are normal.  ____________________________________________     RADIOLOGY  IMPRESSION: 1. No evidence of bowel obstruction or free air. Enteritis involving the jejunum is suspected, as there is apparent fold thickening involving the gas-filled jejunal loops. 2. No acute cardiopulmonary disease.  ____________________________________________   INITIAL IMPRESSION / ASSESSMENT AND PLAN / ED COURSE  Pertinent labs & imaging results that were available during my care of the patient were reviewed by me  and considered in my medical decision making (see chart for details).   Patient presents to the emergency department for nausea vomiting diarrhea, mild epigastric discomfort.  Differential at this time would include gastroenteritis, gastritis, pancreatitis or biliary pathology, less likely SBO, partial SBO.  We will check labs, IV hydrate, treat  with Zofran and continue to closely monitor in the emergency department while awaiting results.  Overall patient appears well on exam with a reassuring physical exam as well as reassuring vital signs.  Work-up consistent with possible enteritis, urinary tract infection.  We will dose IV Rocephin in the emergency department.  Overall patient appears well continues to appear very well on my evaluation.  We will discharge with Zofran and 7 days of Keflex.  Patient agreeable to plan of care.  Julie Acevedo was evaluated in Emergency Department on 04/06/2019 for the symptoms described in the history of present illness. She was evaluated in the context of the global COVID-19 pandemic, which necessitated consideration that the patient might be at risk for infection with the SARS-CoV-2 virus that causes COVID-19. Institutional protocols and algorithms that pertain to the evaluation of patients at risk for COVID-19 are in a state of rapid change based on information released by regulatory bodies including the CDC and federal and state organizations. These policies and algorithms were followed during the patient's care in the ED.  ____________________________________________   FINAL CLINICAL IMPRESSION(S) / ED DIAGNOSES  Nausea vomiting Enteritis UTI   Harvest Dark, MD 04/06/19 1729

## 2019-04-06 NOTE — ED Triage Notes (Signed)
Nauseated x 1 week, started vomiting today.

## 2019-04-06 NOTE — Telephone Encounter (Signed)
Patient called office stating she has had nausea for 1 week. Patient has also has increased shoulder pain, pain at times severe. Patient is requesting rx for nausea medication. While speaking to patient on the phone she threw up. Patient's daughter got on the phone and stated pt has been throwing up every few minutes. Advised her to take pt to ER for evaluation. Daughter stated she will take her now.

## 2019-04-08 LAB — URINE CULTURE: Culture: >=100000 — AB

## 2019-04-10 ENCOUNTER — Telehealth: Payer: Self-pay

## 2019-04-10 NOTE — Telephone Encounter (Signed)
Left message for patient regarding disconnected monitor.  

## 2019-04-15 DIAGNOSIS — M7501 Adhesive capsulitis of right shoulder: Secondary | ICD-10-CM | POA: Diagnosis not present

## 2019-04-16 ENCOUNTER — Emergency Department: Payer: Medicare HMO

## 2019-04-16 ENCOUNTER — Inpatient Hospital Stay
Admission: EM | Admit: 2019-04-16 | Discharge: 2019-04-20 | DRG: 336 | Disposition: A | Discharge status: Home / Self Care | Payer: Medicare HMO | Attending: Surgery | Admitting: Surgery

## 2019-04-16 ENCOUNTER — Other Ambulatory Visit: Payer: Self-pay

## 2019-04-16 ENCOUNTER — Encounter
Admission: EM | Disposition: A | Discharge status: Home / Self Care | Payer: Self-pay | Source: Home / Self Care | Attending: Surgery

## 2019-04-16 ENCOUNTER — Emergency Department: Payer: Medicare HMO | Admitting: Anesthesiology

## 2019-04-16 DIAGNOSIS — Z7952 Long term (current) use of systemic steroids: Secondary | ICD-10-CM | POA: Diagnosis not present

## 2019-04-16 DIAGNOSIS — K565 Intestinal adhesions [bands], unspecified as to partial versus complete obstruction: Principal | ICD-10-CM | POA: Diagnosis present

## 2019-04-16 DIAGNOSIS — D7812 Accidental puncture and laceration of the spleen during other procedure: Secondary | ICD-10-CM | POA: Diagnosis not present

## 2019-04-16 DIAGNOSIS — Z79899 Other long term (current) drug therapy: Secondary | ICD-10-CM | POA: Diagnosis not present

## 2019-04-16 DIAGNOSIS — E785 Hyperlipidemia, unspecified: Secondary | ICD-10-CM | POA: Diagnosis present

## 2019-04-16 DIAGNOSIS — E119 Type 2 diabetes mellitus without complications: Secondary | ICD-10-CM | POA: Diagnosis present

## 2019-04-16 DIAGNOSIS — Z8249 Family history of ischemic heart disease and other diseases of the circulatory system: Secondary | ICD-10-CM | POA: Diagnosis not present

## 2019-04-16 DIAGNOSIS — R100 Acute abdomen: Secondary | ICD-10-CM | POA: Diagnosis present

## 2019-04-16 DIAGNOSIS — I10 Essential (primary) hypertension: Secondary | ICD-10-CM | POA: Diagnosis present

## 2019-04-16 DIAGNOSIS — D72829 Elevated white blood cell count, unspecified: Secondary | ICD-10-CM | POA: Diagnosis present

## 2019-04-16 DIAGNOSIS — M1990 Unspecified osteoarthritis, unspecified site: Secondary | ICD-10-CM | POA: Diagnosis not present

## 2019-04-16 DIAGNOSIS — Z90710 Acquired absence of both cervix and uterus: Secondary | ICD-10-CM

## 2019-04-16 DIAGNOSIS — R109 Unspecified abdominal pain: Secondary | ICD-10-CM

## 2019-04-16 DIAGNOSIS — Z79891 Long term (current) use of opiate analgesic: Secondary | ICD-10-CM

## 2019-04-16 DIAGNOSIS — Z7984 Long term (current) use of oral hypoglycemic drugs: Secondary | ICD-10-CM | POA: Diagnosis not present

## 2019-04-16 DIAGNOSIS — Z4682 Encounter for fitting and adjustment of non-vascular catheter: Secondary | ICD-10-CM | POA: Diagnosis not present

## 2019-04-16 DIAGNOSIS — K439 Ventral hernia without obstruction or gangrene: Secondary | ICD-10-CM | POA: Diagnosis not present

## 2019-04-16 DIAGNOSIS — Z9049 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: Secondary | ICD-10-CM | POA: Diagnosis not present

## 2019-04-16 DIAGNOSIS — Z9884 Bariatric surgery status: Secondary | ICD-10-CM

## 2019-04-16 DIAGNOSIS — I252 Old myocardial infarction: Secondary | ICD-10-CM | POA: Diagnosis not present

## 2019-04-16 DIAGNOSIS — Z8719 Personal history of other diseases of the digestive system: Secondary | ICD-10-CM

## 2019-04-16 DIAGNOSIS — K56609 Unspecified intestinal obstruction, unspecified as to partial versus complete obstruction: Secondary | ICD-10-CM | POA: Diagnosis not present

## 2019-04-16 DIAGNOSIS — K6389 Other specified diseases of intestine: Secondary | ICD-10-CM | POA: Diagnosis present

## 2019-04-16 DIAGNOSIS — Z4659 Encounter for fitting and adjustment of other gastrointestinal appliance and device: Secondary | ICD-10-CM

## 2019-04-16 HISTORY — PX: LAPAROTOMY: SHX154

## 2019-04-16 LAB — COMPREHENSIVE METABOLIC PANEL
ALT: 14 U/L (ref 0–44)
AST: 23 U/L (ref 15–41)
Albumin: 3.9 g/dL (ref 3.5–5.0)
Alkaline Phosphatase: 164 U/L — ABNORMAL HIGH (ref 38–126)
Anion gap: 13 (ref 5–15)
BUN: 23 mg/dL (ref 8–23)
CO2: 26 mmol/L (ref 22–32)
Calcium: 9.1 mg/dL (ref 8.9–10.3)
Chloride: 99 mmol/L (ref 98–111)
Creatinine, Ser: 1.63 mg/dL — ABNORMAL HIGH (ref 0.44–1.00)
GFR calc Af Amer: 38 mL/min — ABNORMAL LOW (ref >60)
GFR calc non Af Amer: 33 mL/min — ABNORMAL LOW (ref >60)
Glucose, Bld: 201 mg/dL — ABNORMAL HIGH (ref 70–99)
Potassium: 3.6 mmol/L (ref 3.5–5.1)
Sodium: 138 mmol/L (ref 135–145)
Total Bilirubin: 0.4 mg/dL (ref 0.3–1.2)
Total Protein: 7.7 g/dL (ref 6.5–8.1)

## 2019-04-16 LAB — URINALYSIS, COMPLETE (UACMP) WITH MICROSCOPIC
Bacteria, UA: NONE SEEN
Bilirubin Urine: NEGATIVE
Glucose, UA: NEGATIVE mg/dL
Hgb urine dipstick: NEGATIVE
Ketones, ur: 5 mg/dL — AB
Leukocytes,Ua: NEGATIVE
Nitrite: NEGATIVE
Protein, ur: NEGATIVE mg/dL
Specific Gravity, Urine: 1.021 (ref 1.005–1.030)
pH: 5 (ref 5.0–8.0)

## 2019-04-16 LAB — CBC
HCT: 40.6 % (ref 36.0–46.0)
Hemoglobin: 13 g/dL (ref 12.0–15.0)
MCH: 27.7 pg (ref 26.0–34.0)
MCHC: 32 g/dL (ref 30.0–36.0)
MCV: 86.6 fL (ref 80.0–100.0)
Platelets: 378 10*3/uL (ref 150–400)
RBC: 4.69 MIL/uL (ref 3.87–5.11)
RDW: 13.5 % (ref 11.5–15.5)
WBC: 22.7 10*3/uL — ABNORMAL HIGH (ref 4.0–10.5)
nRBC: 0 % (ref 0.0–0.2)

## 2019-04-16 LAB — LIPASE, BLOOD: Lipase: 25 U/L (ref 11–51)

## 2019-04-16 LAB — LACTIC ACID, PLASMA: Lactic Acid, Venous: 3.2 mmol/L (ref 0.5–1.9)

## 2019-04-16 SURGERY — LAPAROTOMY, EXPLORATORY
Anesthesia: General | Site: Abdomen

## 2019-04-16 MED ORDER — SUCCINYLCHOLINE CHLORIDE 20 MG/ML IJ SOLN
INTRAMUSCULAR | Status: AC
  Filled 2019-04-16: qty 1

## 2019-04-16 MED ORDER — SODIUM CHLORIDE (PF) 0.9 % IJ SOLN
INTRAMUSCULAR | Status: AC
  Filled 2019-04-16: qty 100

## 2019-04-16 MED ORDER — SUGAMMADEX SODIUM 200 MG/2ML IV SOLN
INTRAVENOUS | Status: AC
  Filled 2019-04-16: qty 2

## 2019-04-16 MED ORDER — BUPIVACAINE LIPOSOME 1.3 % IJ SUSP
INTRAMUSCULAR | Status: AC
  Filled 2019-04-16: qty 20

## 2019-04-16 MED ORDER — LACTATED RINGERS IV SOLN: INTRAVENOUS | Status: DC | PRN

## 2019-04-16 MED ORDER — FENTANYL CITRATE (PF) 250 MCG/5ML IJ SOLN
INTRAMUSCULAR | Status: AC
  Filled 2019-04-16: qty 5

## 2019-04-16 MED ORDER — PHENYLEPHRINE HCL (PRESSORS) 10 MG/ML IV SOLN
INTRAVENOUS | Status: DC | PRN
  Administered 2019-04-16 (×2): 100 ug via INTRAVENOUS
  Administered 2019-04-16 (×2): 200 ug via INTRAVENOUS
  Administered 2019-04-17 (×3): 100 ug via INTRAVENOUS

## 2019-04-16 MED ORDER — LIDOCAINE HCL (CARDIAC) PF 100 MG/5ML IV SOSY
PREFILLED_SYRINGE | INTRAVENOUS | Status: DC | PRN
  Administered 2019-04-16: 60 mg via INTRAVENOUS

## 2019-04-16 MED ORDER — HYDROMORPHONE HCL 1 MG/ML PO LIQD: 1.0000 mg | Freq: Once | ORAL | Status: DC

## 2019-04-16 MED ORDER — PROPOFOL 10 MG/ML IV BOLUS
INTRAVENOUS | Status: DC | PRN
  Administered 2019-04-16: 120 mg via INTRAVENOUS

## 2019-04-16 MED ORDER — SUCCINYLCHOLINE CHLORIDE 20 MG/ML IJ SOLN
INTRAMUSCULAR | Status: DC | PRN
  Administered 2019-04-16: 110 mg via INTRAVENOUS

## 2019-04-16 MED ORDER — SODIUM CHLORIDE 0.9 % IV SOLN
INTRAVENOUS | Status: DC | PRN
  Administered 2019-04-16 (×2): via INTRAVENOUS

## 2019-04-16 MED ORDER — SODIUM CHLORIDE 0.9 % IV SOLN
INTRAVENOUS | Status: DC | PRN
  Administered 2019-04-16: 50 ug/min via INTRAVENOUS

## 2019-04-16 MED ORDER — ONDANSETRON HCL 4 MG/2ML IJ SOLN
4.0000 mg | Freq: Once | INTRAMUSCULAR | Status: AC
  Administered 2019-04-16: 4 mg via INTRAVENOUS

## 2019-04-16 MED ORDER — ROCURONIUM BROMIDE 50 MG/5ML IV SOLN
INTRAVENOUS | Status: AC
  Filled 2019-04-16: qty 1

## 2019-04-16 MED ORDER — LIDOCAINE HCL (PF) 2 % IJ SOLN
INTRAMUSCULAR | Status: AC
  Filled 2019-04-16: qty 10

## 2019-04-16 MED ORDER — DEXAMETHASONE SODIUM PHOSPHATE 10 MG/ML IJ SOLN
INTRAMUSCULAR | Status: AC
  Filled 2019-04-16: qty 1

## 2019-04-16 MED ORDER — HYDROMORPHONE HCL 1 MG/ML IJ SOLN
1.0000 mg | Freq: Once | INTRAMUSCULAR | Status: AC
  Administered 2019-04-16: 1 mg via INTRAVENOUS
  Filled 2019-04-16: qty 1

## 2019-04-16 MED ORDER — MIDAZOLAM HCL 2 MG/2ML IJ SOLN
INTRAMUSCULAR | Status: AC
  Filled 2019-04-16: qty 2

## 2019-04-16 MED ORDER — SODIUM CHLORIDE 0.9 % IV SOLN
1.0000 g | Freq: Once | INTRAVENOUS | Status: AC
  Administered 2019-04-16: 1 g via INTRAVENOUS
  Filled 2019-04-16: qty 1

## 2019-04-16 MED ORDER — ONDANSETRON HCL 4 MG/2ML IJ SOLN
INTRAMUSCULAR | Status: AC
  Filled 2019-04-16: qty 2

## 2019-04-16 MED ORDER — ONDANSETRON HCL 4 MG/2ML IJ SOLN
INTRAMUSCULAR | Status: AC
  Administered 2019-04-16: 4 mg via INTRAVENOUS
  Filled 2019-04-16: qty 2

## 2019-04-16 MED ORDER — ROCURONIUM BROMIDE 100 MG/10ML IV SOLN
INTRAVENOUS | Status: DC | PRN
  Administered 2019-04-16: 20 mg via INTRAVENOUS
  Administered 2019-04-16: 50 mg via INTRAVENOUS

## 2019-04-16 MED ORDER — MORPHINE SULFATE (PF) 2 MG/ML IV SOLN
INTRAVENOUS | Status: AC
  Filled 2019-04-16: qty 2

## 2019-04-16 MED ORDER — SODIUM CHLORIDE 0.9 % IV BOLUS
1000.0000 mL | Freq: Once | INTRAVENOUS | Status: AC
  Administered 2019-04-16: 1000 mL via INTRAVENOUS

## 2019-04-16 MED ORDER — LACTATED RINGERS IV SOLN
INTRAVENOUS | Status: DC | PRN
  Administered 2019-04-16: 22:00:00 via INTRAVENOUS

## 2019-04-16 MED ORDER — MORPHINE SULFATE (PF) 4 MG/ML IV SOLN
4.0000 mg | Freq: Once | INTRAVENOUS | Status: AC
  Administered 2019-04-16: 4 mg via INTRAVENOUS

## 2019-04-16 MED ORDER — PROPOFOL 10 MG/ML IV BOLUS
INTRAVENOUS | Status: AC
  Filled 2019-04-16: qty 20

## 2019-04-16 MED ORDER — BUPIVACAINE HCL (PF) 0.25 % IJ SOLN
INTRAMUSCULAR | Status: AC
  Filled 2019-04-16: qty 30

## 2019-04-16 MED ORDER — FENTANYL CITRATE (PF) 100 MCG/2ML IJ SOLN
INTRAMUSCULAR | Status: DC | PRN
  Administered 2019-04-16 (×2): 50 ug via INTRAVENOUS

## 2019-04-16 SURGICAL SUPPLY — 69 items
APL PRP STRL LF DISP 70% ISPRP (MISCELLANEOUS) ×2
APPLIER CLIP 13 LRG OPEN (CLIP)
APR CLP LRG 13 20 CLIP (CLIP)
BLADE SURG SZ10 CARB STEEL (BLADE) ×3 IMPLANT
BULB RESERV EVAC DRAIN JP 100C (MISCELLANEOUS) ×3 IMPLANT
CANISTER SUCT 1200ML W/VALVE (MISCELLANEOUS) ×3 IMPLANT
CHLORAPREP W/TINT 26 (MISCELLANEOUS) ×3 IMPLANT
CLIP APPLIE 13 LRG OPEN (CLIP) IMPLANT
CNTNR SPEC 2.5X3XGRAD LEK (MISCELLANEOUS) ×2
CONT SPEC 4OZ STER OR WHT (MISCELLANEOUS) ×1
CONT SPEC 4OZ STRL OR WHT (MISCELLANEOUS) ×2
CONTAINER SPEC 2.5X3XGRAD LEK (MISCELLANEOUS) ×2 IMPLANT
COVER WAND RF STERILE (DRAPES) IMPLANT
DRAIN CHANNEL JP 19F (MISCELLANEOUS) ×3 IMPLANT
DRAPE LAPAROTOMY 100X77 ABD (DRAPES) ×3 IMPLANT
DRSG OPSITE POSTOP 4X14 (GAUZE/BANDAGES/DRESSINGS) ×3 IMPLANT
DRSG OPSITE POSTOP 4X8 (GAUZE/BANDAGES/DRESSINGS) IMPLANT
DRSG TEGADERM 4X10 (GAUZE/BANDAGES/DRESSINGS) IMPLANT
DRSG TELFA 3X8 NADH (GAUZE/BANDAGES/DRESSINGS) IMPLANT
ELECT BLADE 6 FLAT ULTRCLN (ELECTRODE) ×3 IMPLANT
ELECT REM PT RETURN 9FT ADLT (ELECTROSURGICAL) ×6
ELECTRODE REM PT RTRN 9FT ADLT (ELECTROSURGICAL) ×4 IMPLANT
GAUZE SPONGE 4X4 12PLY STRL (GAUZE/BANDAGES/DRESSINGS) IMPLANT
GLOVE BIO SURGEON STRL SZ 6.5 (GLOVE) ×3 IMPLANT
GLOVE BIOGEL PI IND STRL 7.0 (GLOVE) ×2 IMPLANT
GLOVE BIOGEL PI INDICATOR 7.0 (GLOVE) ×1
GLOVE INDICATOR 6.5 STRL GRN (GLOVE) ×3 IMPLANT
GLOVE SURG SYN 6.5 ES PF (GLOVE) ×9 IMPLANT
GOWN STRL REUS W/ TWL LRG LVL3 (GOWN DISPOSABLE) ×6 IMPLANT
GOWN STRL REUS W/TWL LRG LVL3 (GOWN DISPOSABLE) ×15 IMPLANT
HANDLE SUCTION POOLE (INSTRUMENTS) ×2 IMPLANT
HEMOSTAT SURGICEL 2X14 (HEMOSTASIS) ×9 IMPLANT
KIT TURNOVER KIT A (KITS) ×3 IMPLANT
LABEL OR SOLS (LABEL) ×3 IMPLANT
LIGASURE IMPACT 36 18CM CVD LR (INSTRUMENTS) ×3 IMPLANT
NDL HPO THNWL 1X22GA REG BVL (NEEDLE) ×2 IMPLANT
NEEDLE SAFETY 22GX1 (NEEDLE) ×3
NS IRRIG 1000ML POUR BTL (IV SOLUTION) ×3 IMPLANT
PACK BASIN MAJOR ARMC (MISCELLANEOUS) ×3 IMPLANT
PACK COLON CLEAN CLOSURE (MISCELLANEOUS) IMPLANT
RELOAD LINEAR CUT PROX 55 BLUE (ENDOMECHANICALS) IMPLANT
RELOAD STAPLER LINEAR PROX 30 (STAPLE) IMPLANT
SLEEVE SCD COMPRESS THIGH MED (MISCELLANEOUS) ×3 IMPLANT
SPONGE DRAIN TRACH 4X4 STRL 2S (GAUZE/BANDAGES/DRESSINGS) ×3 IMPLANT
SPONGE LAP 18X18 RF (DISPOSABLE) ×6 IMPLANT
STAPLER PROXIMATE 55 BLUE (STAPLE) IMPLANT
STAPLER RELOAD LINEAR PROX 30 (STAPLE)
STAPLER SKIN PROX 35W (STAPLE) ×3 IMPLANT
SUCTION POOLE HANDLE (INSTRUMENTS) ×3
SUT CHROMIC 0 SH (SUTURE) IMPLANT
SUT CHROMIC 2 0 SH (SUTURE) IMPLANT
SUT CHROMIC 3 0 SH 27 (SUTURE) IMPLANT
SUT ETHILON 3-0 FS-10 30 BLK (SUTURE) ×3
SUT PDS AB 1 TP1 54 (SUTURE) ×6 IMPLANT
SUT PROLENE 3 0 PS 2 (SUTURE) IMPLANT
SUT SILK 0 FSL (SUTURE) IMPLANT
SUT SILK 2 0 (SUTURE)
SUT SILK 2 0 REEL (SUTURE) IMPLANT
SUT SILK 2-0 18XBRD TIE 12 (SUTURE) IMPLANT
SUT SILK 3 0 (SUTURE) ×3
SUT SILK 3-0 18XBRD TIE 12 (SUTURE) ×2 IMPLANT
SUT VIC AB 0 CT1 18XCR BRD 8 (SUTURE) IMPLANT
SUT VIC AB 0 CT1 8-18 (SUTURE)
SUT VIC AB 3-0 SH 27 (SUTURE)
SUT VIC AB 3-0 SH 27X BRD (SUTURE) IMPLANT
SUTURE EHLN 3-0 FS-10 30 BLK (SUTURE) ×2 IMPLANT
SYR 50ML LL SCALE MARK (SYRINGE) ×6 IMPLANT
TOWEL OR 17X26 4PK STRL BLUE (TOWEL DISPOSABLE) ×3 IMPLANT
TRAY FOLEY MTR SLVR 16FR STAT (SET/KITS/TRAYS/PACK) ×3 IMPLANT

## 2019-04-16 NOTE — ED Triage Notes (Addendum)
Pt arrived via POV with abdominal pain that started about 2 weeks ago that got worse today. Pain is in the lower part of her abdomen. Pt has been having nausea, vomiting, and diarrhea. Pt has hx of intestinal blockage and states this pain in similar.

## 2019-04-16 NOTE — Anesthesia Procedure Notes (Signed)
Procedure Name: Intubation Date/Time: 04/16/2019 9:53 PM Performed by: Aline Brochure, CRNA Pre-anesthesia Checklist: Patient identified, Emergency Drugs available, Suction available and Patient being monitored Patient Re-evaluated:Patient Re-evaluated prior to induction Oxygen Delivery Method: Circle system utilized Preoxygenation: Pre-oxygenation with 100% oxygen Induction Type: IV induction, Rapid sequence and Cricoid Pressure applied Laryngoscope Size: McGraph and 3 Grade View: Grade I Tube type: Oral Tube size: 7.0 mm Number of attempts: 1 Airway Equipment and Method: Stylet and Video-laryngoscopy Placement Confirmation: ETT inserted through vocal cords under direct vision,  positive ETCO2 and breath sounds checked- equal and bilateral Secured at: 21 cm Tube secured with: Tape Dental Injury: Teeth and Oropharynx as per pre-operative assessment  Difficulty Due To: Difficulty was anticipated, Difficult Airway- due to anterior larynx and Difficult Airway- due to reduced neck mobility

## 2019-04-16 NOTE — ED Notes (Signed)
PT given mouth swabs to wet mouth

## 2019-04-16 NOTE — ED Notes (Signed)
Consulting surgeon to bedside

## 2019-04-16 NOTE — Anesthesia Preprocedure Evaluation (Signed)
Anesthesia Evaluation  Patient identified by MRN, date of birth, ID band Patient awake    Reviewed: Allergy & Precautions, H&P , NPO status , Patient's Chart, lab work & pertinent test results, reviewed documented beta blocker date and time   Airway Mallampati: II  TM Distance: >3 FB Neck ROM: full    Dental  (+) Teeth Intact   Pulmonary neg pulmonary ROS,    Pulmonary exam normal        Cardiovascular Exercise Tolerance: Poor hypertension, On Medications (-) angina+ Past MI  (-) CHF and (-) Orthopnea negative cardio ROS Normal cardiovascular exam Rhythm:regular Rate:Normal     Neuro/Psych Seizures -,  PSYCHIATRIC DISORDERS  Neuromuscular disease negative neurological ROS  negative psych ROS   GI/Hepatic negative GI ROS, Neg liver ROS,   Endo/Other  negative endocrine ROSdiabetes, Well Controlled, Type 2, Oral Hypoglycemic Agents  Renal/GU negative Renal ROS  negative genitourinary   Musculoskeletal   Abdominal   Peds  Hematology negative hematology ROS (+) Blood dyscrasia, anemia ,   Anesthesia Other Findings Past Medical History: No date: Arthritis No date: Diverticulitis No date: DM (diabetes mellitus) ([facility])     Comment:  typ e 2 No date: HTN (hypertension) No date: Hyperlipidemia 09/01/2015: Memory difficulties No date: Short-term memory loss Past Surgical History: No date: ABDOMINAL HYSTERECTOMY     Comment:  due to endometriosis-1 ovary left 1971: BREAST EXCISIONAL BIOPSY; Right     Comment:  neg 2000: CARDIAC CATHETERIZATION     Comment:  no significant CAD per note of Dr Caryl Comes in 2013  No date: CHOLECYSTECTOMY 03/19/2017: CYSTOCELE REPAIR; N/A     Comment:  Procedure: ANTERIOR REPAIR (CYSTOCELE);  Surgeon: Bjorn Loser, MD;  [facility];  Service: Urology;                Laterality: N/A; 03/19/2017: CYSTOSCOPY; N/A     Comment:  Procedure: CYSTOSCOPY;  Surgeon: Bjorn Loser, MD;                [facility]: [facility];  Service: Urology;  Laterality: N/A; No date: EYE SURGERY No date: FOOT SURGERY No date: HAND SURGERY No date: HEMICOLECTOMY 08/21/2016: INSERTION OF MESH; N/A     Comment:  Procedure: INSERTION OF MESH;  Surgeon: Excell Seltzer, MD;  [facility]: [facility];  Service: General;                Laterality: N/A; 08/21/2016: LAPAROSCOPIC LYSIS OF ADHESIONS; N/A     Comment:  Procedure: LAPAROSCOPIC LYSIS OF ADHESIONS;  Surgeon:               Excell Seltzer, MD;  [facility]: [facility];  Service:               General;  Laterality: N/A; No date: LAPAROSCOPIC REMOVAL ABDOMINAL MASS 03/21/2018: LOOP RECORDER INSERTION; N/A     Comment:  Procedure: LOOP RECORDER INSERTION;  Surgeon: Sanda Klein, MD;  Location: [place] [facility];  Service:               Cardiovascular;  Laterality: N/A; No date: [place] SURGERY 2010: ROUX-EN-Y GASTRIC BYPASS     Comment:  Dr Hassell Done No date: TONSILLECTOMY 01/12/16: UPPER GI ENDOSCOPY     Comment:  normal larynx, normal esophagus. gastric bypass with a               normal-sized pouch and intact staple line, normal               examined jejunum, otherwise exam was normal 08/21/2016: VENTRAL HERNIA REPAIR; N/A     Comment:  Procedure: LAPAROSCOPIC VENTRAL HERNIA;  Surgeon:               Excell Seltzer, MD;  [facility]: [facility];  Service:               General;  Laterality: N/A; BMI    Body Mass Index:  26.57 kg/m     Reproductive/Obstetrics negative OB ROS                             Anesthesia Physical Anesthesia Plan  ASA: III and emergent  Anesthesia Plan: General ETT   Post-op Pain Management:    Induction:   PONV Risk Score and Plan: 4 or greater  Airway Management Planned:   Additional Equipment:   Intra-op Plan:   Post-operative Plan:   Informed Consent: I have reviewed the patients History and Physical, chart, labs and discussed the  procedure including the risks, benefits and alternatives for the proposed anesthesia with the patient or authorized representative who has indicated his/her understanding and acceptance.     Dental Advisory Given  Plan Discussed with: CRNA  Anesthesia Plan Comments:         Anesthesia Quick Evaluation

## 2019-04-16 NOTE — H&P (Signed)
Subjective:   CC: bowel obstruction, pneumatosis  HPI:  Julie Acevedo is a 68 y.o. female who was consulted by Araceli Bouche for issue above.  Symptoms were first noted 5 days ago. Pain is sharp, diffuse, gradually worsening.  Associated with nausea/vomiting, exacerbated by oral intake.    Reports last colonoscopy few months ago and negative, recommended repeat in ten years.  She has very remote history of diverticulitis requiring resection.  She also has hx of ventral hernia repair with mesh.      Past Medical History:  has a past medical history of Arthritis, Diverticulitis, DM (diabetes mellitus) ([place]), HTN (hypertension), Hyperlipidemia, Memory difficulties (09/01/2015), and Short-term memory loss.  Past Surgical History:  Past Surgical History:  Procedure Laterality Date  . ABDOMINAL HYSTERECTOMY     due to endometriosis-1 ovary left  . BREAST EXCISIONAL BIOPSY Right 1971   neg  . CARDIAC CATHETERIZATION  2000   no significant CAD per note of Dr Caryl Comes in 2013   . CHOLECYSTECTOMY    . CYSTOCELE REPAIR N/A 03/19/2017   Procedure: ANTERIOR REPAIR (CYSTOCELE);  Surgeon: Bjorn Loser, MD;  Location: [facility];  Service: Urology;  Laterality: N/A;  . CYSTOSCOPY N/A 03/19/2017   Procedure: CYSTOSCOPY;  Surgeon: Bjorn Loser, MD;  Location: [facility];  Service: Urology;  Laterality: N/A;  . EYE SURGERY    . FOOT SURGERY    . HAND SURGERY    . HEMICOLECTOMY    . INSERTION OF MESH N/A 08/21/2016   Procedure: INSERTION OF MESH;  Surgeon: Excell Seltzer, MD;  Location: [facility];  Service: General;  Laterality: N/A;  . LAPAROSCOPIC LYSIS OF ADHESIONS N/A 08/21/2016   Procedure: LAPAROSCOPIC LYSIS OF ADHESIONS;  Surgeon: Excell Seltzer, MD;  [facility];  Service: General;  Laterality: N/A;  . LAPAROSCOPIC REMOVAL ABDOMINAL MASS    . LOOP RECORDER INSERTION N/A 03/21/2018   Procedure: LOOP RECORDER INSERTION;  Surgeon: Sanda Klein, MD;  Location: [facility];  Service: Cardiovascular;   Laterality: N/A;  . LUMBAR [place]    . ROUX-EN-Y GASTRIC BYPASS  2010   Dr Hassell Done  . TONSILLECTOMY    . UPPER GI ENDOSCOPY  01/12/16   normal larynx, normal esophagus. gastric bypass with a normal-sized pouch and intact staple line, normal examined jejunum, otherwise exam was normal  . VENTRAL HERNIA REPAIR N/A 08/21/2016   Procedure: LAPAROSCOPIC VENTRAL HERNIA;  Surgeon: Excell Seltzer, MD;  [facility];  Service: General;  Laterality: N/A;    Family History: family history includes Breast cancer in her paternal grandmother; Breast cancer (age of onset: 34) in her paternal aunt; Breast cancer (age of onset: 13) in her paternal aunt; COPD in her mother; Cancer in her paternal aunt and paternal grandmother; Cancer (age of onset: 57) in her father; Congestive Heart Failure in her maternal grandmother; Coronary artery disease (age of onset: 28) in her father; Diabetes in her mother; Emphysema in her maternal grandfather; Heart attack in her paternal grandfather; Hypertension in her mother.  Social History:  reports that she has never smoked. She has never used smokeless tobacco. She reports that she does not drink alcohol or use drugs.  Current Medications: (Not in a hospital admission)   Allergies:  Allergies as of 04/16/2019 - Review Complete 04/16/2019  Allergen Reaction Noted  . Lotrel [amlodipine besy-benazepril hcl] Anaphylaxis 01/27/2012  . Contrast media [iodinated diagnostic agents] Swelling and Other (See Comments) 06/11/2015  . Niacin Hives 06/11/2015  . Sulfa antibiotics Hives 01/27/2012  ROS:  General: Denies weight loss, weight gain, fatigue, fevers, chills, and night sweats. Eyes: Denies blurry vision, double vision, eye pain, itchy eyes, and tearing. Ears: Denies hearing loss, earache, and ringing in ears. Nose: Denies sinus pain, congestion, infections, runny nose, and nosebleeds. Mouth/throat: Denies hoarseness, sore throat, bleeding gums, and difficulty  swallowing. Heart: Denies chest pain, palpitations, racing heart, irregular heartbeat, leg pain or swelling, and decreased activity tolerance. Respiratory: Denies breathing difficulty, shortness of breath, wheezing, cough, and sputum. GI: Denies diarrhea, and blood in stool. GU: Denies difficulty urinating, pain with urinating, urgency, frequency, blood in urine. Musculoskeletal: Denies joint stiffness, pain, swelling, muscle weakness. Skin: Denies rash, itching, mass, tumors, sores, and boils Neurologic: Denies headache, fainting, dizziness, seizures, numbness, and tingling. Psychiatric: Denies depression, anxiety, difficulty sleeping, and memory loss. Endocrine: Denies heat or cold intolerance, and increased thirst or urination. Blood/lymph: Denies easy bruising, easy bruising, and swollen glands     Objective:     BP (!) 141/70   Pulse 78   Temp 97.6 F (36.4 C) (Oral)   Resp 17   Ht 5\' 3"  (1.6 m)   Wt 68 kg   SpO2 92%   BMI 26.57 kg/m   Constitutional :  alert, cooperative, appears stated age and mild distress  Lymphatics/Throat:  no asymmetry, masses, or scars  Respiratory:  clear to auscultation bilaterally  Cardiovascular:  regular rate and rhythm  Gastrointestinal: diffuse tenderness with guarding, consistent with acute abdomen.   Musculoskeletal: Steady gait and movement  Skin: Cool and moist   Psychiatric: Normal affect, non-agitated, not confused       LABS:  CMP Latest Ref Rng & Units 04/16/2019 04/06/2019 10/13/2018  Glucose 70 - 99 mg/dL 201(H) 81 136(H)  BUN 8 - 23 mg/dL 23 29(H) 24(H)  Creatinine 0.44 - 1.00 mg/dL 1.63(H) 1.64(H) 1.48(H)  Sodium 135 - 145 mmol/L 138 140 141  Potassium 3.5 - 5.1 mmol/L 3.6 3.0(L) 4.5  Chloride 98 - 111 mmol/L 99 104 107  CO2 22 - 32 mmol/L 26 27 25   Calcium 8.9 - 10.3 mg/dL 9.1 9.1 8.7(L)  Total Protein 6.5 - 8.1 g/dL 7.7 7.5 -  Total Bilirubin 0.3 - 1.2 mg/dL 0.4 0.5 -  Alkaline Phos 38 - 126 U/L 164(H) 181(H) -  AST  15 - 41 U/L 23 16 -  ALT 0 - 44 U/L 14 12 -   CBC Latest Ref Rng & Units 04/16/2019 04/06/2019 03/19/2019  WBC 4.0 - 10.5 K/uL 22.7(H) 8.3 10.6(H)  Hemoglobin 12.0 - 15.0 g/dL 13.0 12.0 11.2(L)  Hematocrit 36.0 - 46.0 % 40.6 38.7 35.6(L)  Platelets 150 - 400 K/uL 378 338 329    RADS: CLINICAL DATA:  68 year old female with abdominal pain  EXAM: CT ABDOMEN AND PELVIS WITHOUT CONTRAST  TECHNIQUE: Multidetector CT imaging of the abdomen and pelvis was performed following the standard protocol without IV contrast.  COMPARISON:  04/23/2016  FINDINGS: Lower chest: No acute finding of the lower chest  Hepatobiliary: Unremarkable liver. Cholecystectomy. No extrahepatic biliary ductal dilatation.  Pancreas: Unremarkable  Spleen: Unremarkable  Adrenals/Urinary Tract: Unremarkable adrenal glands. Unremarkable kidneys with no hydronephrosis or nephrolithiasis. No perinephric stranding.  Stomach/Bowel: The colon is distended with fluid from the cecum through the transverse colon and splenic flexure into the descending colon. There is mixed formed stool in fluid within the sigmoid colon, with a gradual tapering through the descending colon and the sigmoid colon. The bowel is not well evaluated given the absence of IV contrast,  and so the study is nondiagnostic for evaluation of bowel wall, or potentially luminal mass. There is questionable pneumatosis in a few regions of the colonic wall there is narrowing of sigmoid colon at the rectosigmoid junction, uncertain significance. No focal inflammatory changes. Relatively moderate stool burden, with no evidence of large rectal stool volume.  There is fluid-filled distal small bowel, borderline dilated. No evidence of transition point of small bowel.  Surgical changes of the stomach.  Vascular/Lymphatic: Mild atherosclerosis.  Small lymph nodes of mesentery and retroperitoneal stations. Small lymph nodes of the inguinal  region.  Reproductive: Hysterectomy  Other: Surgical changes of the midline abdomen.  Musculoskeletal: No acute displaced fracture. Surgical changes of L4-L5.  IMPRESSION: Colonic obstruction is suspected, with fluid-filled and distended distal small bowel and proximal colon. Colon is distended throughout its length to the descending colon. The etiology is uncertain, given the absence of IV contrast, however, there is questionable pneumatosis of the descending colon which may indicate ischemia, and correlation with lab values may be useful. The current CT protocol is nondiagnostic for the possibility of any obstructing endoluminal mass or stricture. Surgical referral may be indicated as well as consideration of contrast-enhanced CT for better evaluation of the colon wall.  Surgical changes of the stomach.  Cholecystectomy.  Additional ancillary findings as above.   Electronically Signed   By: Corrie Mckusick D.O.   On: 04/16/2019 19:36  Assessment:   Colonic obstruction, likely pneumatosis of colon based on CT image report and personally reviewed by myself, consistent with acute abdomen and leukocytosis on exam.  Plan:    Discussed surgery for lysis of adhesions, possible bowel resection, possible ostomy.  The risk of surgery include, but not limited to, recurrence, bleeding, chronic pain, post-op infxn, post-op SBO or ileus, hernias, resection of bowel, re-anastamosis, possible ostomy placement and need for re-operation to address said risks. The risks of general anesthetic, if used, includes MI, CVA, sudden death or even reaction to anesthetic medications also discussed. Alternatives include continued observation and NG decompression, but unlikely to benefit and surgical intervention can likely lead to worse outcome.  Benefits include possible symptom relief, preventing further decline in health and possible death.  Typical post-op recovery time of additional days  in hospital for observation afterwards also discussed, along with care of ostomy if needed.  The patient verbalized understanding and all questions were answered to the patient's satisfaction.  She states she is ok to proceed with "whatever needs to be done" in order to make her better.  Will proceed urgently.    Consult received at [facility], finished assessment by 2025.

## 2019-04-16 NOTE — ED Provider Notes (Signed)
[facility] Emergency Department Provider Note    ____________________________________________   I have reviewed the triage vital signs and the nursing notes.   HISTORY  Chief Complaint Abdominal Pain   History limited by: Not Limited   HPI Julie Acevedo is a 68 y.o. female who presents to the emergency department today because of concerns for abdominal pain.  Is located in the central lower abdomen primarily.  She states that she started having some pain 3 or 4 days ago.  Today however the pain became severe.  It was accompanied by some nausea and vomiting.  She has not had any bowel movement today nor is passed any gas.  She states the pain reminds her when she has had an obstruction in the past.  She denies any true fevers but states she has had some hot flashes.   Records reviewed. Per medical record review patient has a history of admission for SBO in 2017  Past Medical History:  Diagnosis Date  . Arthritis   . Diverticulitis   . DM (diabetes mellitus) ([place])    typ e 2  . HTN (hypertension)   . Hyperlipidemia   . Memory difficulties 09/01/2015  . Short-term memory loss     Patient Active Problem List   Diagnosis Date Noted  . Acquired trigger finger 03/20/2019  . Iron deficiency anemia 11/21/2018  . Syncope 03/18/2018  . Anemia 03/18/2018  . Exophthalmos 02/22/2018  . Multiple thyroid nodules 02/22/2018  . 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 Pain at surgical site 08/30/2016  . Ventral incisional hernia 08/21/2016  . SBO (small bowel obstruction) ([place])   . Partial small bowel obstruction ([place]) 04/23/2016  . Adjustment disorder with mixed anxiety and depressed mood 04/23/2016  . Memory difficulties 09/01/2015  . Arthritis 06/11/2015  . Back pain, chronic 06/11/2015  . HLD (hyperlipidemia) 06/11/2015  . Cannot sleep 06/11/2015  . L-S radiculopathy 06/11/2015  . Arthritis of knee, degenerative 06/11/2015  . B12 deficiency 06/11/2015  .  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 DM2 (diabetes mellitus, type 2) ([place]) 11/15/2012  . HTN (hypertension) 01/27/2012  . Adiposity 11/21/2009  . Avitaminosis D 11/07/2009  . Narrowing of intervertebral disc space 07/25/2009  . Benign essential HTN 07/25/2009    Past Surgical History:  Procedure Laterality Date  . ABDOMINAL HYSTERECTOMY     due to endometriosis-1 ovary left  . BREAST EXCISIONAL BIOPSY Right 1971   neg  . CARDIAC CATHETERIZATION  2000   no significant CAD per note of Dr Caryl Comes in 2013   . CHOLECYSTECTOMY    . CYSTOCELE REPAIR N/A 03/19/2017   Procedure: ANTERIOR REPAIR (CYSTOCELE);  Surgeon: Bjorn Loser, MD;  [facility];  Service: Urology;  Laterality: N/A;  . CYSTOSCOPY N/A 03/19/2017   Procedure: CYSTOSCOPY;  Surgeon: Bjorn Loser, MD;  [facility];  Service: Urology;  Laterality: N/A;  . EYE SURGERY    . FOOT SURGERY    . HAND SURGERY    . HEMICOLECTOMY    . INSERTION OF MESH N/A 08/21/2016   Procedure: INSERTION OF MESH;  Surgeon: Excell Seltzer, MD;  [facility];  Service: General;  Laterality: N/A;  . LAPAROSCOPIC LYSIS OF ADHESIONS N/A 08/21/2016   Procedure: LAPAROSCOPIC LYSIS OF ADHESIONS;  Surgeon: Excell Seltzer, MD;  [facility];  Service: General;  Laterality: N/A;  . LAPAROSCOPIC REMOVAL ABDOMINAL MASS    . LOOP RECORDER INSERTION N/A 03/21/2018  Procedure: LOOP RECORDER INSERTION;  Surgeon: Sanda Klein, MD;  Location: [facility];  Service: Cardiovascular;  Laterality: N/A;  . LUMBAR [place]    . ROUX-EN-Y GASTRIC BYPASS  2010   Dr Hassell Done  . TONSILLECTOMY    . UPPER GI ENDOSCOPY  01/12/16   normal larynx, normal esophagus. gastric bypass with a normal-sized pouch and intact staple line, normal examined jejunum, otherwise exam was normal  . VENTRAL HERNIA REPAIR N/A 08/21/2016   Procedure: LAPAROSCOPIC VENTRAL HERNIA;  Surgeon: Excell Seltzer, MD;  [facility];  Service: General;  Laterality: N/A;    Prior to Admission medications   Medication Sig Start Date End Date Taking? Authorizing Provider  ALPRAZolam Duanne Moron) 0.25 MG tablet Take 1 tablet by mouth as needed.    [provider]  ALPRAZolam Duanne Moron) 0.5 MG tablet Take 1 tablet (0.5 mg total) by mouth at bedtime as needed for anxiety. Take 1 PO, 1 hour before flying 12/02/18   Jerrol Banana., MD  amLODipine (NORVASC) 10 MG tablet Take 1 tablet by mouth 1 day or 1 dose.    [provider]  amoxicillin (AMOXIL) 500 MG capsule Take 1 capsule by mouth 2 (two) times daily.    [provider]  bisacodyl (DULCOLAX) 5 MG EC tablet bisacodyl 5 mg tablet,delayed release  TAKE AS DIRECTED    [provider]  cephALEXin (KEFLEX) 500 MG capsule Take 1 capsule (500 mg total) by mouth 2 (two) times daily. 04/06/19   Harvest Dark, MD  diclofenac (VOLTAREN) 75 MG EC tablet diclofenac sodium 75 mg tablet,delayed release  Take 1 tablet twice a day by oral route.    [provider]  Elastic Bandages & Supports (COMPRESSION & SUPPORT GLOVES L) [place]  08/19/18   [provider]  folic acid (FOLVITE) 1 MG tablet Take 1 tablet (1 mg total) by mouth daily. 03/21/18   Shelly Coss, MD  gabapentin (NEURONTIN) 100 MG capsule TAKE 2 CAPSULES (200 MG TOTAL) BY MOUTH 3 (THREE) TIMES DAILY. 11/17/18   Jerrol Banana., MD  hydrochlorothiazide (HYDRODIURIL) 25 MG tablet TAKE 1 TABLET (25 MG TOTAL) BY MOUTH DAILY. 11/24/18   Jerrol Banana., MD  HYDROcodone-acetaminophen [facility]) 10-325 MG tablet hydrocodone 10 mg-acetaminophen 325 mg tablet    [provider]  LINZESS 290 MCG CAPS capsule TAKE 1 CAPSULE (290 MCG TOTAL) BY MOUTH DAILY BEFORE BREAKFAST. 03/11/19   Jerrol Banana., MD  Melatonin 10 MG TABS Take 10 mg by mouth at bedtime as needed (for sleep.).     [provider]  meloxicam (MOBIC) 15 MG  tablet Take 15 mg by mouth daily.  08/13/17   [provider]  metFORMIN (GLUCOPHAGE) 500 MG tablet Take 1 tablet (500 mg total) by mouth daily with breakfast. 11/18/17   Jerrol Banana., MD  methocarbamol (ROBAXIN) 500 MG tablet methocarbamol 500 mg tablet    [provider]  metoprolol succinate (TOPROL-XL) 25 MG 24 hr tablet Take 25 mg by mouth daily.    [provider]  ondansetron (ZOFRAN ODT) 4 MG disintegrating tablet Take 1 tablet (4 mg total) by mouth every 8 (eight) hours as needed for nausea or vomiting. 04/06/19   Harvest Dark, MD  PEG 3350-KCl-NaBcb-NaCl-NaSulf (PEG 3350/ELECTROLYTES) 240 g SOLR peg 3350 240 gram-electrolytes 22.72 gram-6.72 g-5.84 g powdr for soln    [provider]  potassium chloride SA (KLOR-CON M20) 20 MEQ tablet Klor-Con M20 mEq tablet,extended  release    [provider]  predniSONE (DELTASONE) 10 MG tablet prednisone 10 mg tablet    [provider]  traMADol (ULTRAM) 50 MG tablet tramadol 50 mg tablet    [provider]  traZODone (DESYREL) 50 MG tablet Take 50 mg by mouth at bedtime as needed.  12/14/18   [provider]    Allergies Lotrel [amlodipine besy-benazepril hcl]; Contrast media [iodinated diagnostic agents]; Niacin; and Sulfa antibiotics  Family History  Problem Relation Age of Onset  . Cancer Father 29       Lung  . Coronary artery disease Father 49  . COPD Mother   . Diabetes Mother   . Hypertension Mother   . Cancer Paternal Grandmother        Breast  . Breast cancer Paternal Grandmother   . Congestive Heart Failure Maternal Grandmother   . Emphysema Maternal Grandfather   . Heart attack Paternal Grandfather   . Cancer Paternal Aunt        Breast  . Breast cancer Paternal Aunt 67  . Breast cancer Paternal Aunt 32    Social History Social History   Tobacco Use  . Smoking status: Never Smoker  . Smokeless tobacco: Never Used  Substance Use Topics   . Alcohol use: No  . Drug use: No    Review of Systems Constitutional: No fever/chills Eyes: No visual changes. ENT: No sore throat. Cardiovascular: Denies chest pain. Respiratory: Denies shortness of breath. Gastrointestinal: Positive for abdominal pain, nausea vomiting. Lack of stooling.    Genitourinary: Negative for dysuria. Musculoskeletal: Negative for back pain. Skin: Negative for rash. Neurological: Negative for headaches, focal weakness or numbness.  ____________________________________________   PHYSICAL EXAM:  VITAL SIGNS: ED Triage Vitals [04/16/19 1826]  Enc Vitals Group     BP 123/67     Pulse Rate 69     Resp 18     Temp 97.6 F (36.4 C)     Temp Source Oral     SpO2 100 %     Weight 150 lb (68 kg)     Height '5\' 3"'  (1.6 m)     Head Circumference      Peak Flow      Pain Score 10   Constitutional: Alert and oriented.  Eyes: Conjunctivae are normal.  ENT      Head: Normocephalic and atraumatic.      Nose: No congestion/rhinnorhea.      Mouth/Throat: Mucous membranes are moist.      Neck: No stridor. Hematological/Lymphatic/Immunilogical: No cervical lymphadenopathy. Cardiovascular: Normal rate, regular rhythm.  No murmurs, rubs, or gallops. Respiratory: Normal respiratory effort without tachypnea nor retractions. Breath sounds are clear and equal bilaterally. No wheezes/rales/rhonchi. Gastrointestinal: Soft and tender to palpation in the mid and lower abdomen.  Genitourinary: Deferred Musculoskeletal: Normal range of motion in all extremities. No lower extremity edema. Neurologic:  Normal speech and language. No gross focal neurologic deficits are appreciated.  Skin:  Skin is warm, dry and intact. No rash noted. Psychiatric: Mood and affect are normal. Speech and behavior are normal. Patient exhibits appropriate insight and judgment.  ____________________________________________    LABS (pertinent positives/negatives)  Lipase 25 CBC wbc 22.7,  hgb 13.0, plt 378 CMP wnl except glu 201, cr 1.63, alk phos 164, t bili 0.4 ____________________________________________   EKG  I, Nance Pear, attending physician, personally viewed and interpreted this EKG  EKG Time: 1837 Rate: 73 Rhythm: normal sinus rhythm Axis: normal Intervals: qtc 429 QRS: narrow  ST changes: no st elevation Impression: normal ekg ____________________________________________    RADIOLOGY  CT abd/pel Concern for colonic obstruction, pneumatosis.   ____________________________________________   PROCEDURES  Procedures  ____________________________________________   INITIAL IMPRESSION / ASSESSMENT AND PLAN / ED COURSE  Pertinent labs & imaging results that were available during my care of the patient were reviewed by me and considered in my medical decision making (see chart for details).   Patient presented to the emergency department today because of concern for abdominal pain. On exam patient is tender to palpation in the mid and lower abdomen. WBC elevated at 22. CT abd pel concerning for pneumatosis. Consulted surgery who came to evaluate the patient. Will take patient to OR.  ____________________________________________   FINAL CLINICAL IMPRESSION(S) / ED DIAGNOSES  Final diagnoses:  Abdominal pain, unspecified abdominal location  Pneumatosis of intestines     Note: This dictation was prepared with Dragon dictation. Any transcriptional errors that result from this process are unintentional     Nance Pear, MD 04/16/19 2039

## 2019-04-16 NOTE — ED Notes (Signed)
Archie Balboa, MD made aware of lactic acid

## 2019-04-16 NOTE — ED Notes (Addendum)
PT family members updated with permission PT husband called and yelled and cussed this RN d/t waiting in the parking stating the hospital needed to get our "shit together".

## 2019-04-17 ENCOUNTER — Inpatient Hospital Stay: Payer: Medicare HMO

## 2019-04-17 DIAGNOSIS — D72829 Elevated white blood cell count, unspecified: Secondary | ICD-10-CM | POA: Diagnosis present

## 2019-04-17 DIAGNOSIS — Z7984 Long term (current) use of oral hypoglycemic drugs: Secondary | ICD-10-CM | POA: Diagnosis not present

## 2019-04-17 DIAGNOSIS — R100 Acute abdomen: Secondary | ICD-10-CM | POA: Diagnosis present

## 2019-04-17 DIAGNOSIS — Z7952 Long term (current) use of systemic steroids: Secondary | ICD-10-CM | POA: Diagnosis not present

## 2019-04-17 DIAGNOSIS — Z79899 Other long term (current) drug therapy: Secondary | ICD-10-CM | POA: Diagnosis not present

## 2019-04-17 DIAGNOSIS — K6389 Other specified diseases of intestine: Secondary | ICD-10-CM | POA: Diagnosis present

## 2019-04-17 DIAGNOSIS — I10 Essential (primary) hypertension: Secondary | ICD-10-CM | POA: Diagnosis present

## 2019-04-17 DIAGNOSIS — E119 Type 2 diabetes mellitus without complications: Secondary | ICD-10-CM | POA: Diagnosis present

## 2019-04-17 DIAGNOSIS — K565 Intestinal adhesions [bands], unspecified as to partial versus complete obstruction: Secondary | ICD-10-CM | POA: Diagnosis present

## 2019-04-17 DIAGNOSIS — Z9884 Bariatric surgery status: Secondary | ICD-10-CM | POA: Diagnosis not present

## 2019-04-17 DIAGNOSIS — Z8249 Family history of ischemic heart disease and other diseases of the circulatory system: Secondary | ICD-10-CM | POA: Diagnosis not present

## 2019-04-17 DIAGNOSIS — K56609 Unspecified intestinal obstruction, unspecified as to partial versus complete obstruction: Secondary | ICD-10-CM | POA: Diagnosis present

## 2019-04-17 DIAGNOSIS — Z8719 Personal history of other diseases of the digestive system: Secondary | ICD-10-CM | POA: Diagnosis not present

## 2019-04-17 DIAGNOSIS — Z90710 Acquired absence of both cervix and uterus: Secondary | ICD-10-CM | POA: Diagnosis not present

## 2019-04-17 DIAGNOSIS — E785 Hyperlipidemia, unspecified: Secondary | ICD-10-CM | POA: Diagnosis present

## 2019-04-17 DIAGNOSIS — Z79891 Long term (current) use of opiate analgesic: Secondary | ICD-10-CM | POA: Diagnosis not present

## 2019-04-17 DIAGNOSIS — D7812 Accidental puncture and laceration of the spleen during other procedure: Secondary | ICD-10-CM | POA: Diagnosis not present

## 2019-04-17 LAB — BASIC METABOLIC PANEL
Anion gap: 14 (ref 5–15)
BUN: 28 mg/dL — ABNORMAL HIGH (ref 8–23)
CO2: 21 mmol/L — ABNORMAL LOW (ref 22–32)
Calcium: 8.3 mg/dL — ABNORMAL LOW (ref 8.9–10.3)
Chloride: 107 mmol/L (ref 98–111)
Creatinine, Ser: 1.45 mg/dL — ABNORMAL HIGH (ref 0.44–1.00)
GFR calc Af Amer: 44 mL/min — ABNORMAL LOW (ref >60)
GFR calc non Af Amer: 38 mL/min — ABNORMAL LOW (ref >60)
Glucose, Bld: 227 mg/dL — ABNORMAL HIGH (ref 70–99)
Potassium: 3.8 mmol/L (ref 3.5–5.1)
Sodium: 142 mmol/L (ref 135–145)

## 2019-04-17 LAB — GLUCOSE, CAPILLARY
Glucose-Capillary: 133 mg/dL — ABNORMAL HIGH (ref 70–99)
Glucose-Capillary: 224 mg/dL — ABNORMAL HIGH (ref 70–99)
Glucose-Capillary: 159 mg/dL — ABNORMAL HIGH (ref 70–99)
Glucose-Capillary: 133 mg/dL — ABNORMAL HIGH (ref 70–99)
Glucose-Capillary: 157 mg/dL — ABNORMAL HIGH (ref 70–99)

## 2019-04-17 LAB — HEMOGLOBIN AND HEMATOCRIT, BLOOD
HCT: 44.3 % (ref 36.0–46.0)
Hemoglobin: 13.4 g/dL (ref 12.0–15.0)

## 2019-04-17 MED ORDER — METOPROLOL SUCCINATE ER 25 MG PO TB24
25.0000 mg | ORAL_TABLET | Freq: Every day | ORAL | Status: DC
  Administered 2019-04-17 – 2019-04-20 (×4): 25 mg via ORAL
  Filled 2019-04-17 (×4): qty 1

## 2019-04-17 MED ORDER — TRAMADOL HCL 50 MG PO TABS
50.0000 mg | ORAL_TABLET | Freq: Four times a day (QID) | ORAL | Status: DC | PRN
  Administered 2019-04-18 – 2019-04-20 (×5): 50 mg via ORAL
  Filled 2019-04-17 (×5): qty 1

## 2019-04-17 MED ORDER — ONDANSETRON HCL 4 MG/2ML IJ SOLN: 4.0000 mg | Freq: Four times a day (QID) | INTRAMUSCULAR | Status: DC | PRN

## 2019-04-17 MED ORDER — INSULIN ASPART 100 UNIT/ML ~~LOC~~ SOLN
0.0000 [IU] | Freq: Three times a day (TID) | SUBCUTANEOUS | Status: DC
  Administered 2019-04-17: 5 [IU] via SUBCUTANEOUS
  Administered 2019-04-17 (×2): 2 [IU] via SUBCUTANEOUS
  Filled 2019-04-17: qty 1

## 2019-04-17 MED ORDER — PHENYLEPHRINE HCL (PRESSORS) 10 MG/ML IV SOLN
INTRAVENOUS | Status: AC
  Filled 2019-04-17: qty 1

## 2019-04-17 MED ORDER — ACETAMINOPHEN 650 MG RE SUPP: 650.0000 mg | Freq: Four times a day (QID) | RECTAL | Status: DC | PRN

## 2019-04-17 MED ORDER — EVICEL 5 ML EX KIT
PACK | CUTANEOUS | Status: AC
  Filled 2019-04-17: qty 1

## 2019-04-17 MED ORDER — HYDROCHLOROTHIAZIDE 25 MG PO TABS
25.0000 mg | ORAL_TABLET | Freq: Every day | ORAL | Status: DC
  Administered 2019-04-17 – 2019-04-20 (×4): 25 mg via ORAL
  Filled 2019-04-17 (×4): qty 1

## 2019-04-17 MED ORDER — SUGAMMADEX SODIUM 200 MG/2ML IV SOLN
INTRAVENOUS | Status: DC | PRN
  Administered 2019-04-17: 60 mg via INTRAVENOUS
  Administered 2019-04-17: 140 mg via INTRAVENOUS

## 2019-04-17 MED ORDER — DEXAMETHASONE SODIUM PHOSPHATE 10 MG/ML IJ SOLN
INTRAMUSCULAR | Status: DC | PRN
  Administered 2019-04-17: 10 mg via INTRAVENOUS

## 2019-04-17 MED ORDER — BUPIVACAINE LIPOSOME 1.3 % IJ SUSP
INTRAMUSCULAR | Status: DC | PRN
  Administered 2019-04-17: 20 mL

## 2019-04-17 MED ORDER — HYDROMORPHONE HCL 1 MG/ML IJ SOLN
0.5000 mg | INTRAMUSCULAR | Status: DC | PRN
  Administered 2019-04-17 – 2019-04-18 (×2): 0.5 mg via INTRAVENOUS
  Filled 2019-04-17 (×2): qty 0.5

## 2019-04-17 MED ORDER — LACTATED RINGERS IV SOLN
INTRAVENOUS | Status: DC
  Administered 2019-04-17 – 2019-04-19 (×5): via INTRAVENOUS

## 2019-04-17 MED ORDER — DEXTROSE 50 % IV SOLN: 12.5000 g | INTRAVENOUS | Status: AC

## 2019-04-17 MED ORDER — TRAMADOL HCL 50 MG PO TABS: 50.0000 mg | ORAL_TABLET | Freq: Four times a day (QID) | ORAL | Status: DC | PRN

## 2019-04-17 MED ORDER — BUPIVACAINE HCL 0.25 % IJ SOLN
INTRAMUSCULAR | Status: DC | PRN
  Administered 2019-04-17: 30 mL

## 2019-04-17 MED ORDER — ACETAMINOPHEN 325 MG PO TABS: 650.0000 mg | ORAL_TABLET | Freq: Four times a day (QID) | ORAL | Status: DC | PRN

## 2019-04-17 MED ORDER — SODIUM CHLORIDE 0.9 % IV SOLN: INTRAVENOUS | Status: DC | PRN

## 2019-04-17 MED ORDER — ONDANSETRON HCL 4 MG/2ML IJ SOLN
INTRAMUSCULAR | Status: DC | PRN
  Administered 2019-04-17: 4 mg via INTRAVENOUS

## 2019-04-17 MED ORDER — ALPRAZOLAM 0.5 MG PO TABS: 0.5000 mg | ORAL_TABLET | Freq: Every evening | ORAL | Status: DC | PRN

## 2019-04-17 MED ORDER — SODIUM CHLORIDE (PF) 0.9 % IJ SOLN
INTRAMUSCULAR | Status: DC | PRN
  Administered 2019-04-17: 50 mL via INTRAVENOUS

## 2019-04-17 MED ORDER — ONDANSETRON HCL 4 MG/2ML IJ SOLN: 4.0000 mg | Freq: Once | INTRAMUSCULAR | Status: DC | PRN

## 2019-04-17 MED ORDER — EVICEL 5 ML EX KIT
PACK | CUTANEOUS | Status: DC | PRN
  Administered 2019-04-17: 1

## 2019-04-17 MED ORDER — FENTANYL CITRATE (PF) 100 MCG/2ML IJ SOLN: 25.0000 ug | INTRAMUSCULAR | Status: DC | PRN

## 2019-04-17 MED ORDER — ONDANSETRON 4 MG PO TBDP: 4.0000 mg | ORAL_TABLET | Freq: Four times a day (QID) | ORAL | Status: DC | PRN

## 2019-04-17 MED ORDER — CEPHALEXIN 500 MG PO CAPS
500.0000 mg | ORAL_CAPSULE | Freq: Two times a day (BID) | ORAL | Status: DC
  Administered 2019-04-17 (×2): 500 mg via ORAL
  Filled 2019-04-17 (×3): qty 1

## 2019-04-17 MED ORDER — GABAPENTIN 100 MG PO CAPS
200.0000 mg | ORAL_CAPSULE | Freq: Three times a day (TID) | ORAL | Status: DC
  Administered 2019-04-17 – 2019-04-20 (×10): 200 mg via ORAL
  Filled 2019-04-17 (×10): qty 2

## 2019-04-17 MED ORDER — TRAZODONE HCL 50 MG PO TABS: 50.0000 mg | ORAL_TABLET | Freq: Every evening | ORAL | Status: DC | PRN

## 2019-04-17 NOTE — Progress Notes (Signed)
Subjective:  CC: Julie Acevedo is a 68 y.o. female  Hospital stay day 0, 1 Day Post-Op ex-lap, LOA  HPI: No specific complaints except for soreness around incision site.  ROS:  General: Denies weight loss, weight gain, fatigue, fevers, chills, and night sweats. Heart: Denies chest pain, palpitations, racing heart, irregular heartbeat, leg pain or swelling, and decreased activity tolerance. Respiratory: Denies breathing difficulty, shortness of breath, wheezing, cough, and sputum. GI: Denies change in appetite, heartburn, nausea, vomiting, constipation, diarrhea, and blood in stool. GU: Denies difficulty urinating, pain with urinating, urgency, frequency, blood in urine.   Objective:   Temp:  [97.3 F (36.3 C)-98.3 F (36.8 C)] 97.5 F (36.4 C) (05/01 0408) Pulse Rate:  [69-91] 85 (05/01 0619) Resp:  [11-22] 16 (05/01 0619) BP: (119-147)/(67-90) 135/90 (05/01 0619) SpO2:  [90 %-100 %] 100 % (05/01 0619) Weight:  [68 kg] 68 kg (04/30 1826)     Height: 5\' 3"  (160 cm) Weight: 68 kg BMI (Calculated): 26.58   Intake/Output this shift:   Intake/Output Summary (Last 24 hours) at 04/17/2019 0738 Last data filed at 04/17/2019 0300 Gross per 24 hour  Intake 3000 ml  Output 1045 ml  Net 1955 ml  JP with 122ml of sanguinous discharge since surgery  Constitutional :  alert, cooperative, appears stated age and no distress  Respiratory:  clear to auscultation bilaterally  Cardiovascular:  regular rate and rhythm  Gastrointestinal: Tender around incision site with staples intact, small area of bleeding at the inferior portion, not active bleeding at this time.  Abdominal exam otherwise is soft with minimal guarding improved from preoperative exam.   Skin: Cool and moist.   Psychiatric: Normal affect, non-agitated, not confused       LABS:  CMP Latest Ref Rng & Units 04/16/2019 04/06/2019 10/13/2018  Glucose 70 - 99 mg/dL 201(H) 81 136(H)  BUN 8 - 23 mg/dL 23 29(H) 24(H)  Creatinine 0.44 -  1.00 mg/dL 1.63(H) 1.64(H) 1.48(H)  Sodium 135 - 145 mmol/L 138 140 141  Potassium 3.5 - 5.1 mmol/L 3.6 3.0(L) 4.5  Chloride 98 - 111 mmol/L 99 104 107  CO2 22 - 32 mmol/L 26 27 25   Calcium 8.9 - 10.3 mg/dL 9.1 9.1 8.7(L)  Total Protein 6.5 - 8.1 g/dL 7.7 7.5 -  Total Bilirubin 0.3 - 1.2 mg/dL 0.4 0.5 -  Alkaline Phos 38 - 126 U/L 164(H) 181(H) -  AST 15 - 41 U/L 23 16 -  ALT 0 - 44 U/L 14 12 -   CBC Latest Ref Rng & Units 04/16/2019 04/06/2019 03/19/2019  WBC 4.0 - 10.5 K/uL 22.7(H) 8.3 10.6(H)  Hemoglobin 12.0 - 15.0 g/dL 13.0 12.0 11.2(L)  Hematocrit 36.0 - 46.0 % 40.6 38.7 35.6(L)  Platelets 150 - 400 K/uL 378 338 329    RADS: CLINICAL DATA:  68 year old female postoperative day zero exploratory laparotomy, lysis of adhesions for large bowel obstruction. Enteric tube placement. Previous gastrojejunostomy.  EXAM: PORTABLE ABDOMEN - 1 VIEW  COMPARISON:  CT Abdomen and Pelvis 04/16/2019.  FINDINGS: Portable AP semi upright view at 0234 hours. Enteric tube in place with side hole at the level of the distal esophagus. Tip at the level of the gastric pouch.  Left abdominal surgical drain. New midline skin staples. Stable cholecystectomy clips.  Overpenetrated lungs. Left chest cardiac loop recorder.  IMPRESSION: 1. Enteric tube side hole at the level of the distal esophagus, and tip at the level of the gastric pouch. 2. New postoperative changes to the  abdomen with left abdominal surgical drain in place.   Electronically Signed   By: Genevie Ann M.D.   On: 04/17/2019 03:39  Assessment:   Large bowel obstruction postop day 1 exploratory laparotomy with lysis of adhesions.  Splenic lacerations noted during the procedure which was controlled Intra-Op.  No signs of continued bleeding clinically at this time.  Pending follow-up CBC this a.m.  Need to monitor closely.  Continue NG tube until return of bowel function.  Foley catheter for another day for close monitoring of  urine output.  Signout given to Dr. Hampton Abbot who will be covering for me this weekend.

## 2019-04-17 NOTE — Anesthesia Post-op Follow-up Note (Signed)
Anesthesia QCDR form completed.        

## 2019-04-17 NOTE — Transfer of Care (Signed)
Immediate Anesthesia Transfer of Care Note  Patient: Julie Acevedo  Procedure(s) Performed: EXPLORATORY LAPAROTOMY (N/A )  Patient Location: PACU  Anesthesia Type:General  Level of Consciousness: awake  Airway & Oxygen Therapy: Patient connected to face mask oxygen  Post-op Assessment: Post -op Vital signs reviewed and stable  Post vital signs: stable  Last Vitals:  Vitals Value Taken Time  BP 147/79 04/17/2019  2:08 AM  Temp 36.3 C 04/17/2019  2:08 AM  Pulse 79 04/17/2019  2:09 AM  Resp 22 04/17/2019  2:09 AM  SpO2 98 % 04/17/2019  2:09 AM  Vitals shown include unvalidated device data.  Last Pain:  Vitals:   04/17/19 0208  TempSrc: Temporal  PainSc:          Complications: No apparent anesthesia complications

## 2019-04-17 NOTE — Op Note (Addendum)
Preoperative diagnosis: large bowel obstruction  Postoperative diagnosis: same  Procedure: Exploratory laparatomy, lysis of adhesions, splenic flexure takedown   Anesthesia: GETA  Surgeon: Benjamine Sprague, DO Assistant: Windell Moment, for assistance in better exposure  Complications: Splenic injury during dissection and lysis of adhesions, requiring Dr. Peyton Najjar to assist with better visualization of the area.  EBL: 450 mL  Wound Classification: clean  Indications:  Patient is a 68 y.o. female with above dx.  Due to acute abdomen, leukocytosis, and possible pneumatosis of colon, taken urgently to OR.  Description of procedure:  The patient was placed in the supine position and general endotracheal anesthesia was induced. A time-out was completed verifying correct patient, procedure, site, positioning, and implant(s) and/or special equipment prior to beginning this procedure. Preoperative antibiotics were continued from floor. The abdomen was prepped and draped in the usual sterile fashion. A vertical midline incision was made from xyphoid to pubis. This was deepened through the subcutaneous tissues and hemostasis was achieved with electrocautery. The linea alba was identified and incised and the peritoneal cavity entered after transecting previously placed mesh. The abdomen was explored.   Roux-en-Y gastric bypass anatomy noted, and running over the transverse colon.  Extensive adhesions were noted throughout the abdomen including the omentum overlying and densely adhered to the transverse colon.  Severely dilated cecum was noted and this was tracked along underneath the liver, where the omentum was densely attached to the liver itself.  This was carefully taken down via electrocautery with minimal bleeding.  The transverse colon was then traced towards the splenic flexure where additional dense adhesions was noted between the spleen, omentum, and the colon itself.  Beyond the splenic flexure  additional adhesions were noted along the lateral wall and the colon.  Due to the dense adhesions, it was initially difficult to assess where the transition point was located.  Therefore dissection and lysis of adhesions was carried out starting at the splenic flexure.  Removal of adhesions performed for the majority of the case.  Once all the adhesions were meticulously taken down, there was noted to be a couple of splenic lacerations from the required lysis of adhesions.  This was initially controlled with packing in the left upper quadrant.  Inspection of the colon noted a dilated transverse colon transitioning to a more normal caliber descending colon about halfway down the left paracolic gutter.  This was where the adhesions were the most severe, making it the likely transition point.  Immediately beyond this transition point there was noted to be healthy descending colon, but with numerous, small, what look like diverticula.  The colon containing these diverticula itself looked very viable, with no evidence of inflammation, discharge, dilation, or induration on palpation.  Therefore it was decided that no bowel obstruction was needed at this time.    Attention was then turned to the splenic laceration, each measuring approximately 2 cm in length, and still noted to have slow but continuous bleeding.  Lacerations were treated with electrocautery, Surgicel, and Tissel hemostatic agent.  After the combination of the 3 modalities, there is no active bleeding noted on inspection over a period of several minutes.  Blake drain was placed in the splenic gutter to further ensure no future bleeding in the area.  Drain was then secured to the skin using 3-0 nylon suture.  The drain site was then dressed with a drain sponge.  NG was placed within the gastric remnant.  The midline incision fascia was infused with Exparel and closed with  running suture of PDS 1 x2. The skin was closed with skin staples and dressed  with honeycomb dressing.  Patient was then successfully extubated and transferred to PACU in stable condition.  Foley catheter placed prior to the procedure remains in place along with the NG tube.  At the end of the procedure all sponge and instrument counts were correct.  The bulb that was attached to the [place] drain only noted approximately 30 mL's of sanguinous discharge when the patient left the OR.

## 2019-04-18 LAB — BASIC METABOLIC PANEL
Anion gap: 7 (ref 5–15)
BUN: 30 mg/dL — ABNORMAL HIGH (ref 8–23)
CO2: 29 mmol/L (ref 22–32)
Calcium: 8.4 mg/dL — ABNORMAL LOW (ref 8.9–10.3)
Chloride: 108 mmol/L (ref 98–111)
Creatinine, Ser: 1.52 mg/dL — ABNORMAL HIGH (ref 0.44–1.00)
GFR calc Af Amer: 41 mL/min — ABNORMAL LOW (ref >60)
GFR calc non Af Amer: 36 mL/min — ABNORMAL LOW (ref >60)
Glucose, Bld: 135 mg/dL — ABNORMAL HIGH (ref 70–99)
Potassium: 3.8 mmol/L (ref 3.5–5.1)
Sodium: 144 mmol/L (ref 135–145)

## 2019-04-18 LAB — GLUCOSE, CAPILLARY
Glucose-Capillary: 109 mg/dL — ABNORMAL HIGH (ref 70–99)
Glucose-Capillary: 88 mg/dL (ref 70–99)
Glucose-Capillary: 114 mg/dL — ABNORMAL HIGH (ref 70–99)
Glucose-Capillary: 103 mg/dL — ABNORMAL HIGH (ref 70–99)

## 2019-04-18 LAB — CBC
HCT: 34.4 % — ABNORMAL LOW (ref 36.0–46.0)
Hemoglobin: 10.9 g/dL — ABNORMAL LOW (ref 12.0–15.0)
MCH: 27.9 pg (ref 26.0–34.0)
MCHC: 31.7 g/dL (ref 30.0–36.0)
MCV: 88.2 fL (ref 80.0–100.0)
Platelets: 334 10*3/uL (ref 150–400)
RBC: 3.9 MIL/uL (ref 3.87–5.11)
RDW: 13.9 % (ref 11.5–15.5)
WBC: 14.8 10*3/uL — ABNORMAL HIGH (ref 4.0–10.5)
nRBC: 0 % (ref 0.0–0.2)

## 2019-04-18 NOTE — Progress Notes (Signed)
04/18/2019  Subjective: Patient is 2 Days Post-Op status post exploratory laparotomy with lysis of adhesions.  No acute events overnight.  Patient reports this morning that she had a bowel movement.  There is been minimal output from her NG tube.  Vital signs: Temp:  [98.6 F (37 C)-99.8 F (37.7 C)] 98.7 F (37.1 C) (05/02 1235) Pulse Rate:  [73-90] 73 (05/02 1235) Resp:  [16-18] 18 (05/02 1235) BP: (117-142)/(65-73) 127/65 (05/02 1235) SpO2:  [94 %-100 %] 95 % (05/02 1235)   Intake/Output: 05/01 0701 - 05/02 0700 In: 2163.5 [I.V.:2163.5] Out: 435 [Urine:300; Drains:135] Last BM Date: 04/17/19  Physical Exam: Constitutional: No acute distress Abdomen: Soft, nondistended, appropriately tender to palpation.  Midline incision is clean dry and intact with staples in place.  Patient has a JP drain going to the left upper quadrant with serosanguineous fluid.  Labs:  Recent Labs    04/16/19 1834 04/17/19 0916 04/18/19 0332  WBC 22.7*  --  14.8*  HGB 13.0 13.4 10.9*  HCT 40.6 44.3 34.4*  PLT 378  --  334   Recent Labs    04/17/19 0916 04/18/19 0332  NA 142 144  K 3.8 3.8  CL 107 108  CO2 21* 29  GLUCOSE 227* 135*  BUN 28* 30*  CREATININE 1.45* 1.52*  CALCIUM 8.3* 8.4*   No results for input(s): LABPROT, INR in the last 72 hours.  Imaging: No results found.  Assessment/Plan: This is a 68 y.o. female s/p exploratory laparotomy with lysis of adhesions.  - The patient's hemoglobin did decrease to 10.9 from 13.4 yesterday but I do believe this is dilutional in nature.  Currently there is no evidence of gross bleeding from her JP drain which was the site of concern in the left upper quadrant by the spleen.  She is hemodynamically stable. -Patient had a bowel movement overnight.  There is been minimal output from NG tube.  We will clamp NG this morning and check residuals at 12:30 PM.  We may be able to discontinue her NG tube today and start her on clear liquids.   Melvyn Neth, [facility]

## 2019-04-19 ENCOUNTER — Encounter: Payer: Self-pay | Admitting: Surgery

## 2019-04-19 LAB — BASIC METABOLIC PANEL
Anion gap: 7 (ref 5–15)
BUN: 20 mg/dL (ref 8–23)
CO2: 29 mmol/L (ref 22–32)
Calcium: 8.3 mg/dL — ABNORMAL LOW (ref 8.9–10.3)
Chloride: 106 mmol/L (ref 98–111)
Creatinine, Ser: 1.27 mg/dL — ABNORMAL HIGH (ref 0.44–1.00)
GFR calc Af Amer: 51 mL/min — ABNORMAL LOW (ref >60)
GFR calc non Af Amer: 44 mL/min — ABNORMAL LOW (ref >60)
Glucose, Bld: 98 mg/dL (ref 70–99)
Potassium: 3.4 mmol/L — ABNORMAL LOW (ref 3.5–5.1)
Sodium: 142 mmol/L (ref 135–145)

## 2019-04-19 LAB — GLUCOSE, CAPILLARY
Glucose-Capillary: 80 mg/dL (ref 70–99)
Glucose-Capillary: 83 mg/dL (ref 70–99)
Glucose-Capillary: 92 mg/dL (ref 70–99)
Glucose-Capillary: 104 mg/dL — ABNORMAL HIGH (ref 70–99)

## 2019-04-19 LAB — CBC
HCT: 34.1 % — ABNORMAL LOW (ref 36.0–46.0)
Hemoglobin: 10.5 g/dL — ABNORMAL LOW (ref 12.0–15.0)
MCH: 27.3 pg (ref 26.0–34.0)
MCHC: 30.8 g/dL (ref 30.0–36.0)
MCV: 88.6 fL (ref 80.0–100.0)
Platelets: 312 10*3/uL (ref 150–400)
RBC: 3.85 MIL/uL — ABNORMAL LOW (ref 3.87–5.11)
RDW: 13.6 % (ref 11.5–15.5)
WBC: 13.7 10*3/uL — ABNORMAL HIGH (ref 4.0–10.5)
nRBC: 0 % (ref 0.0–0.2)

## 2019-04-19 MED ORDER — POTASSIUM CHLORIDE CRYS ER 20 MEQ PO TBCR
40.0000 meq | EXTENDED_RELEASE_TABLET | Freq: Once | ORAL | Status: AC
  Administered 2019-04-19: 40 meq via ORAL
  Filled 2019-04-19: qty 2

## 2019-04-19 NOTE — TOC Initial Note (Signed)
Transition of Care [facility]) - Initial/Assessment Note    Patient Details  Name: Julie Acevedo MRN: [id number] Date of Birth: 04/04/1951  Transition of [facility]) CM/SW Contact:    Latanya Maudlin, RN Phone Number: 04/19/2019, 12:15 PM  Clinical Narrative: PT recommended home health. Patient tells me she is independent at baseline and was able to walk 260 feet here without issues. Has a rolling walker if needed. Patient decline home health at this time.                         Patient Goals and CMS Choice        Expected Discharge Plan and Services                                     HH Arranged: Patient Refused HH          Prior Living Arrangements/Services                  Current home services: DME    Activities of Daily Living Home Assistive Devices/Equipment: None ADL Screening (condition at time of admission) Patient's cognitive ability adequate to safely complete daily activities?: Yes Is the patient deaf or have difficulty hearing?: No Does the patient have difficulty seeing, even when wearing glasses/contacts?: No Does the patient have difficulty concentrating, remembering, or making decisions?: No Patient able to express need for assistance with ADLs?: Yes Does the patient have difficulty dressing or bathing?: No Independently performs ADLs?: Yes (appropriate for developmental age) Does the patient have difficulty walking or climbing stairs?: No Weakness of Legs: None Weakness of Arms/Hands: None  Permission Sought/Granted                  Emotional Assessment              Admission diagnosis:  Pneumatosis of intestines [K63.89] Abdominal pain, unspecified abdominal location [R10.9] Patient Active Problem List   Diagnosis Date Noted  . Large bowel obstruction ([place]) 04/17/2019  . Acquired trigger finger 03/20/2019  . Iron deficiency anemia 11/21/2018  . Syncope 03/18/2018  . Anemia 03/18/2018  . Exophthalmos 02/22/2018  .  Multiple thyroid nodules 02/22/2018  . 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 Pain at surgical site 08/30/2016  . Ventral incisional hernia 08/21/2016  . SBO (small bowel obstruction) ([place])   . Partial small bowel obstruction ([place]) 04/23/2016  . Adjustment disorder with mixed anxiety and depressed mood 04/23/2016  . Memory difficulties 09/01/2015  . Arthritis 06/11/2015  . Back pain, chronic 06/11/2015  . HLD (hyperlipidemia) 06/11/2015  . Cannot sleep 06/11/2015  . L-S radiculopathy 06/11/2015  . Arthritis of knee, degenerative 06/11/2015  . B12 deficiency 06/11/2015  . 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 DM2 (diabetes mellitus, type 2) ([place]) 11/15/2012  . HTN (hypertension) 01/27/2012  . Adiposity 11/21/2009  . Avitaminosis D 11/07/2009  . Narrowing of intervertebral disc space 07/25/2009  . Benign essential HTN 07/25/2009   PCP:  Jerrol Banana., MD Pharmacy:   [id number] - [place], [place] [address] [place] [address] Phone: [phone] Fax: [phone]     Social Determinants of Health (SDOH) Interventions    Readmission Risk Interventions No flowsheet data found.

## 2019-04-19 NOTE — Progress Notes (Signed)
04/19/2019  Subjective: Patient is 3 Days Post-Op status post exploratory laparotomy with lysis of adhesions for large bowel obstruction.  Patient has been doing well and she had a bowel movement yesterday.  NG tube was removed yesterday her diet was advanced to clear liquids.  Her pain is well controlled and her drain remained serosanguineous.  Vital signs: Temp:  [98.2 F (36.8 C)-98.9 F (37.2 C)] 98.5 F (36.9 C) (05/03 1308) Pulse Rate:  [60-80] 73 (05/03 1308) Resp:  [16-18] 18 (05/03 1308) BP: (95-140)/(44-71) 95/44 (05/03 1308) SpO2:  [95 %-100 %] 100 % (05/03 1308)   Intake/Output: 05/02 0701 - 05/03 0700 In: 3126.3 [I.V.:3126.3] Out: 555 [Urine:500; Drains:55] Last BM Date: 04/18/19  Physical Exam: Constitutional: No acute distress Abdomen: Soft, nondistended, appropriately tender to palpation.  Midline incision is clean dry and intact with staples in place.  Left-sided drain with serosanguineous fluid.  Labs:  Recent Labs    04/18/19 0332 04/19/19 0347  WBC 14.8* 13.7*  HGB 10.9* 10.5*  HCT 34.4* 34.1*  PLT 334 312   Recent Labs    04/18/19 0332 04/19/19 0347  NA 144 142  K 3.8 3.4*  CL 108 106  CO2 29 29  GLUCOSE 135* 98  BUN 30* 20  CREATININE 1.52* 1.27*  CALCIUM 8.4* 8.3*   No results for input(s): LABPROT, INR in the last 72 hours.  Imaging: No results found.  Assessment/Plan: This is a 68 y.o. female s/p exploratory laparotomy with lysis of adhesions.  -We will advance the patient's diet to a soft diet this morning. - Physical therapy evaluate the patient today and they recommended home health PT.  However the patient declined.  The recommended that she could potentially get a rolling walker if she desired. -Her WBC was initially elevated on admission, it has been coming down on its own without antibiotics. -Depending how she does tomorrow, may be able to discharge her as early as tomorrow.   Melvyn Neth, [facility]

## 2019-04-19 NOTE — Evaluation (Signed)
Physical Therapy Evaluation Patient Details Name: Julie Acevedo MRN: [id number] DOB: 01-Dec-1951 Today's Date: 04/19/2019   History of Present Illness  Julie Acevedo is a 68yo female who comes to [facility] on 4/30 c sharp, diffuse ABD pain, N/V: pt admitted with SBO. Pt underwent exlap 5/1, lysis of bowel adhesions. PTA pt was fully independent in ADL, IADL, recent difficulty with accessing the 2nd floor repeatedly, hence husband helped turn [name] into an improvised bedroom abotu 1 month ago. PMH: diverticulitis, DM, HTN, memory imapirment, L4/5 surgery, syncopal episode 1YA.   Clinical Impression  Pt admitted with above diagnosis. Pt currently with functional limitations due to the deficits listed below (see "PT Problem List"). Upon entry, pt in bed, awake and agreeable to participate. The pt is alert and oriented x3, pleasant, conversational, and generally a good historian. Pt AMB to BR frequently independently prior to arrival. Pt performed bed mobility, transfers, and AMB all with modified independence. Pt educated on log roll technique with helps with pain management during bed mobility, and RW use during longer distance AMB out of room which helps with ABD pain when walking. Functional mobility assessment demonstrates increased effort/time requirements, poor tolerance, and need for supervision for IV pole management, whereas the patient performed these at a higher level of independence PTA. Pt has been having some increased difficulty with managing stairs to her 2nd story bedroom for 1 month PTA, but today endorses no frank acute weakness. Pt reports 1 year chronic right shoulder pain "Dr. Sabra Heck said it's arthritis," but without any obvious functional limitation within session. Pt will benefit from skilled PT intervention to increase independence and safety with basic mobility in preparation for discharge to the venue listed below.       Follow Up Recommendations Home health PT(would help with recent  difficulty with steps; Pt reports she is not interested in HHPT servcies at time of eval)    Equipment Recommendations  Rolling walker with 5" wheels    Recommendations for Other Services       Precautions / Restrictions Precautions Precautions: Fall Precaution Comments: Pt has been AMB freely in room, managing IV pole, prior to entry; LUQ JP drain; xiphoid to pubis incision Restrictions Weight Bearing Restrictions: No Other Position/Activity Restrictions: pt has an ABD brace pillow      Mobility  Bed Mobility Overal bed mobility: Modified Independent;Needs Assistance Bed Mobility: Supine to Sit;Sit to Supine     Supine to sit: Supervision Sit to supine: Supervision   General bed mobility comments: partial trunk flexion with bracing pillow; educated on log roll technique to Rt EOB, pt reports significantly less painful.   Transfers Overall transfer level: Modified independent Equipment used: None             General transfer comment: AMB to BR frequently Prior to entry  Ambulation/Gait Ambulation/Gait assistance: Supervision Gait Distance (Feet): 360 Feet Assistive device: Rolling walker (2 wheeled) Gait Pattern/deviations: Antalgic;Shuffle Gait velocity: 0.40m/s  Gait velocity interpretation: 1.31 - 2.62 ft/sec, indicative of limited community ambulator General Gait Details: RW improved control of ABD pain; PT manages IV pole.   Stairs            Wheelchair Mobility    Modified Rankin (Stroke Patients Only)       Balance Overall balance assessment: No apparent balance deficits (not formally assessed)  Pertinent Vitals/Pain Pain Assessment: 0-10 Pain Score: 7  Pain Location: ABD pain with movement, improved from a 10/10 to 7/10 with RW use during AMB.  Pain Descriptors / Indicators: Aching Pain Intervention(s): Limited activity within patient's tolerance;Monitored during  session;Premedicated before session;Repositioned;Other (comment)(taught log roll technique in/out opf bed)    Home Living Family/patient expects to be discharged to:: Private residence Living Arrangements: Spouse/significant other Available Help at Discharge: Family(2 DTRs live nearby) Type of Home: House Home Access: Stairs to enter   [employer] of Steps: 3 s rail; or 5 c 2 rails able to reach both  Home Layout: Two level;Able to live on main level with bedroom/bathroom Home Equipment: Kasandra Knudsen - single point;Walker - 2 wheels;Wheelchair - manual;Shower seat      Prior Function Level of Independence: Independent         Comments: still driving cleaning cooking.      Hand Dominance   Dominant Hand: Left    Extremity/Trunk Assessment   Upper Extremity Assessment Upper Extremity Assessment: Overall WFL for tasks assessed    Lower Extremity Assessment Lower Extremity Assessment: Overall WFL for tasks assessed       Communication   Communication: No difficulties  Cognition Arousal/Alertness: Awake/alert Behavior During Therapy: WFL for tasks assessed/performed Overall Cognitive Status: Within Functional Limits for tasks assessed                                        General Comments      Exercises     Assessment/Plan    PT Assessment Patient needs continued PT services  PT Problem List Decreased range of motion;Decreased activity tolerance;Decreased mobility;Decreased knowledge of use of DME;Decreased knowledge of precautions;Decreased skin integrity       PT Treatment Interventions DME instruction;Gait training;Functional mobility training;Therapeutic activities;Therapeutic exercise;Neuromuscular re-education;Patient/family education    PT Goals (Current goals can be found in the Care Plan section)  Acute Rehab PT Goals Patient Stated Goal: Regain mobility and strength  PT Goal Formulation: With patient Time For Goal Achievement:  05/03/19 Potential to Achieve Goals: Good    Frequency Min 2X/week   Barriers to discharge        Co-evaluation               AM-PAC PT "6 Clicks" Mobility  Outcome Measure Help needed turning from your back to your side while in a flat bed without using bedrails?: None Help needed moving from lying on your back to sitting on the side of a flat bed without using bedrails?: None Help needed moving to and from a bed to a chair (including a wheelchair)?: None Help needed standing up from a chair using your arms (e.g., wheelchair or bedside chair)?: None Help needed to walk in hospital room?: None Help needed climbing 3-5 steps with a railing? : A Little 6 Click Score: 23    End of Session   Activity Tolerance: Patient tolerated treatment well Patient left: in chair;with call bell/phone within reach Nurse Communication: (unable to reach RN on ASCOM x3; no emergent info to relay) PT Visit Diagnosis: Other abnormalities of gait and mobility (R26.89);Difficulty in walking, not elsewhere classified (R26.2);Muscle weakness (generalized) (M62.81);Pain Pain - Right/Left: (central ) Pain - part of body: (ABD)    Time: 8338-2505 PT Time Calculation (min) (ACUTE ONLY): 30 min   Charges:   PT Evaluation $PT Eval Low Complexity: 1 Low PT Treatments $  Therapeutic Exercise: 8-22 mins        10:18 AM, 04/19/19 Etta Grandchild, PT, DPT Physical Therapist - [facility]  [phone] ([place])    [place] C 04/19/2019, 10:14 AM

## 2019-04-20 ENCOUNTER — Encounter: Payer: Self-pay | Admitting: Family Medicine

## 2019-04-20 LAB — CBC
HCT: 30.6 % — ABNORMAL LOW (ref 36.0–46.0)
Hemoglobin: 9.7 g/dL — ABNORMAL LOW (ref 12.0–15.0)
MCH: 27.7 pg (ref 26.0–34.0)
MCHC: 31.7 g/dL (ref 30.0–36.0)
MCV: 87.4 fL (ref 80.0–100.0)
Platelets: 314 10*3/uL (ref 150–400)
RBC: 3.5 MIL/uL — ABNORMAL LOW (ref 3.87–5.11)
RDW: 13.4 % (ref 11.5–15.5)
WBC: 10.5 10*3/uL (ref 4.0–10.5)
nRBC: 0 % (ref 0.0–0.2)

## 2019-04-20 LAB — BASIC METABOLIC PANEL
Anion gap: 5 (ref 5–15)
BUN: 12 mg/dL (ref 8–23)
CO2: 30 mmol/L (ref 22–32)
Calcium: 8.2 mg/dL — ABNORMAL LOW (ref 8.9–10.3)
Chloride: 105 mmol/L (ref 98–111)
Creatinine, Ser: 1.18 mg/dL — ABNORMAL HIGH (ref 0.44–1.00)
GFR calc Af Amer: 56 mL/min — ABNORMAL LOW (ref >60)
GFR calc non Af Amer: 48 mL/min — ABNORMAL LOW (ref >60)
Glucose, Bld: 84 mg/dL (ref 70–99)
Potassium: 4.1 mmol/L (ref 3.5–5.1)
Sodium: 140 mmol/L (ref 135–145)

## 2019-04-20 LAB — HEMOGLOBIN AND HEMATOCRIT, BLOOD
HCT: 33.5 % — ABNORMAL LOW (ref 36.0–46.0)
Hemoglobin: 10.5 g/dL — ABNORMAL LOW (ref 12.0–15.0)

## 2019-04-20 LAB — SURGICAL PATHOLOGY

## 2019-04-20 LAB — GLUCOSE, CAPILLARY
Glucose-Capillary: 79 mg/dL (ref 70–99)
Glucose-Capillary: 108 mg/dL — ABNORMAL HIGH (ref 70–99)

## 2019-04-20 MED ORDER — TRAMADOL HCL 50 MG PO TABS: 50.0000 mg | ORAL_TABLET | Freq: Four times a day (QID) | ORAL | 0 refills | Status: AC | PRN

## 2019-04-20 NOTE — Discharge Summary (Signed)
Physician Discharge Summary  Patient ID: Julie Acevedo MRN: [id number] DOB/AGE: [birth year] 68 y.o.  Admit date: 04/16/2019 Discharge date: 04/20/2019  Admission Diagnoses: LBO   Discharge Diagnoses:  Same as above  Discharged Condition: good  Hospital Course: dx with above and underwent ex-lap, lysis of adhesions.  Intra-op, small injury to spleen noted, so drain placed for further monitoring.  No bleeding issues since.  Bowel function returned and diet advanced as tolerated.  At time of discharge, pain controlled, tolerating diet.  Will leave with staples intact and JP in place for further monitoring  Consults: None  Discharge Exam: Blood pressure (!) 139/58, pulse 66, temperature 98.6 F (37 C), temperature source Oral, resp. rate 20, height 5\' 3"  (1.6 m), weight 68 kg, SpO2 97 %. General appearance: alert, cooperative and no distress GI: soft, no guarding, appropriate tenderness around incision site.  staples c/d/i. JP with thin serosanguinous fluid  Disposition:     Allergies as of 04/20/2019      Reactions   Lotrel [amlodipine Besy-benazepril Hcl] Anaphylaxis   Contrast Media [iodinated Diagnostic Agents] Swelling, Other (See Comments)   Reaction:  Neck swelling    Niacin Hives   Sulfa Antibiotics Hives      Medication List    TAKE these medications   ALPRAZolam 0.5 MG tablet Commonly known as:  Xanax Take 1 tablet (0.5 mg total) by mouth at bedtime as needed for anxiety. Take 1 PO, 1 hour before flying   bisacodyl 5 MG EC tablet Commonly known as:  DULCOLAX bisacodyl 5 mg tablet,delayed release  TAKE AS DIRECTED   cephALEXin 500 MG capsule Commonly known as:  KEFLEX Take 1 capsule (500 mg total) by mouth 2 (two) times daily.   Compression & Support Gloves L Misc   diclofenac 75 MG EC tablet Commonly known as:  VOLTAREN diclofenac sodium 75 mg tablet,delayed release  Take 1 tablet twice a day by oral route.   folic acid 1 MG tablet Commonly known as:   FOLVITE Take 1 tablet (1 mg total) by mouth daily.   gabapentin 100 MG capsule Commonly known as:  NEURONTIN TAKE 2 CAPSULES (200 MG TOTAL) BY MOUTH 3 (THREE) TIMES DAILY.   hydrochlorothiazide 25 MG tablet Commonly known as:  HYDRODIURIL TAKE 1 TABLET (25 MG TOTAL) BY MOUTH DAILY.   Klor-Con M20 20 MEQ tablet Generic drug:  potassium chloride SA Klor-Con M20 mEq tablet,extended release   Linzess 290 MCG Caps capsule Generic drug:  linaclotide TAKE 1 CAPSULE (290 MCG TOTAL) BY MOUTH DAILY BEFORE BREAKFAST. What changed:  See the new instructions.   Melatonin 10 MG Tabs Take 10 mg by mouth at bedtime as needed (for sleep.).   meloxicam 15 MG tablet Commonly known as:  MOBIC Take 15 mg by mouth daily.   metFORMIN 500 MG tablet Commonly known as:  GLUCOPHAGE Take 1 tablet (500 mg total) by mouth daily with breakfast.   metoprolol succinate 25 MG 24 hr tablet Commonly known as:  TOPROL-XL Take 25 mg by mouth daily.   ondansetron 4 MG disintegrating tablet Commonly known as:  Zofran ODT Take 1 tablet (4 mg total) by mouth every 8 (eight) hours as needed for nausea or vomiting.   traMADol 50 MG tablet Commonly known as:  ULTRAM tramadol 50 mg tablet What changed:  Another medication with the same name was added. Make sure you understand how and when to take each.   traMADol 50 MG tablet Commonly known as:  ULTRAM Take 1  tablet (50 mg total) by mouth every 6 (six) hours as needed for up to 5 days for moderate pain. What changed:  You were already taking a medication with the same name, and this prescription was added. Make sure you understand how and when to take each.   traZODone 50 MG tablet Commonly known as:  DESYREL Take 50 mg by mouth at bedtime as needed.      Follow-up Information    Idolina Mantell, DO Follow up in 1 week(s).   Specialty:  Surgery Why:  for staple and drain removal Contact information: [address] [place]  [phone]            Total time spent arranging discharge was >40min. Signed: Benjamine Sprague 04/20/2019, 11:44 AM

## 2019-04-20 NOTE — Progress Notes (Signed)
Pt discharged per MD order. IV removed. Discharge instructions reviewed with pt. Pt verbalized understanding. Pt demonstrated how to properly care for JP drain. Pt given supplies for drain care. Pt taken downstairs in wheelchair for discharge.

## 2019-04-20 NOTE — Discharge Instructions (Signed)
Laparoscopic Lysis of adhesions, Care After This sheet gives you information about how to care for yourself after your procedure. Your health care provider may also give you more specific instructions. If you have problems or questions, contact your health care provider. What can I expect after the procedure? After your procedure, it is common to have the following:  Pain in your abdomen, especially in the incision areas. You will be given medicine to control the pain.  Tiredness. This is a normal part of the recovery process. Your energy level will return to normal over the next several weeks.  Changes in your bowel movements, such as constipation or needing to go more often. Talk with your health care provider about how to manage this. Follow these instructions at home: Medicines  Do not drive or use heavy machinery while taking prescription pain medicine.  Do not drink alcohol while taking prescription pain medicine.  If you were prescribed an antibiotic medicine, use it as told by your health care provider. Do not stop using the antibiotic even if you start to feel better. Incision care     Follow instructions from your health care provider about how to take care of your incision and drain areas. Make sure you: ? Keep your incisions clean and dry. ? Wash your hands with soap and water before and after applying medicine to the areas, and before and after changing your bandage (dressing). If soap and water are not available, use hand sanitizer. ? Change your dressing as told by your health care provider. ? Leave stitches (sutures), skin glue, or adhesive strips in place. These skin closures may need to stay in place for 2 weeks or longer. If adhesive strip edges start to loosen and curl up, you may trim the loose edges. Do not remove adhesive strips completely unless your health care provider tells you to do that.  Do not wear tight clothing over the incisions. Tight clothing may rub  and irritate the incision areas, which may cause the incisions to open.  Do not take baths, swim, or use a hot tub until your health care provider approves. OK TO SHOWER.    Check your incision area every day for signs of infection. Check for: ? More redness, swelling, or pain. ? More fluid or blood. ? Warmth. ? Pus or a bad smell. Activity  Avoid lifting anything that is heavier than 10 lb (4.5 kg) for 2 weeks or until your health care provider says it is okay.  You may resume normal activities as told by your health care provider. Ask your health care provider what activities are safe for you.  Take rest breaks during the day as needed. Eating and drinking  Follow instructions from your health care provider about what you can eat after surgery.  To prevent or treat constipation while you are taking prescription pain medicine, your health care provider may recommend that you: ? Drink enough fluid to keep your urine clear or pale yellow. ? Take over-the-counter or prescription medicines. ? Eat foods that are high in fiber, such as fresh fruits and vegetables, whole grains, and beans. ? Limit foods that are high in fat and processed sugars, such as fried and sweet foods. General instructions  Ask your health care provider when you will need an appointment to get your sutures or staples removed.  Keep all follow-up visits as told by your health care provider. This is important. Contact a health care provider if:  You have more redness,  swelling, or pain around your incisions.  You have more fluid or blood coming from the incisions.  Your incisions feel warm to the touch.  You have pus or a bad smell coming from your incisions or your dressing.  You have a fever.  You have an incision that breaks open (edges not staying together) after sutures or staples have been removed. Get help right away if:  You develop a rash.  You have chest pain or difficulty breathing.  You  have pain or swelling in your legs.  You feel light-headed or you faint.  Your abdomen swells (becomes distended).  You have nausea or vomiting.  You have blood in your stool (feces). This information is not intended to replace advice given to you by your health care provider. Make sure you discuss any questions you have with your health care provider. Document Released: 06/22/2005 Document Revised: 08/22/2018 Document Reviewed: 09/03/2016 Elsevier Interactive Patient Education  2019 [employer].

## 2019-04-20 NOTE — Care Management Important Message (Signed)
Important Message  Patient Details  Name: Julie Acevedo MRN: [id number] Date of Birth: 11/05/1951   Medicare Important Message Given:  Yes    Dannette Barbara 04/20/2019, 10:45 AM

## 2019-04-20 NOTE — Progress Notes (Signed)
Physical Therapy Treatment Patient Details Name: Julie Acevedo MRN: [id number] DOB: 02/27/1951 Today's Date: 04/20/2019    History of Present Illness Julie Acevedo is a 68yo female who comes to [facility] on 4/30 c sharp, diffuse ABD pain, N/V: pt admitted with SBO. Pt underwent exlap 5/1, lysis of bowel adhesions. PTA pt was fully independent in ADL, IADL, recent difficulty with accessing the 2nd floor repeatedly, hence husband helped turn [name] into an improvised bedroom abotu 1 month ago. PMH: diverticulitis, DM, HTN, memory imapirment, L4/5 surgery, syncopal episode 1YA.     PT Comments    Pt in bed upon entry, motivated to get up and AMB with PT. Pt declines review of log roll education this date, as she reports her pain is too high. Pt sets a goal of 3 times around the RN station, which she achieves, then she AMB an additional 134ft to perform stairs. Pt has 5 or less to enter home, but she performs 12 steps without encouragement. Pt demonstrates subsequent heavy smiling and verbal exaltation in celebration of her stairs performance. Pt demonstrates improved gait speed this date by 20+% compared to 1DA, however, she stops more frequently 2/2 pain, wincing at times. Pt progressing well overall, no recommendations for HHPT at this time.     Follow Up Recommendations  No PT follow up(pt previously declined; now demonstrating supervision level stairs performance without difficulty)     Equipment Recommendations  Rolling walker with 5" wheels    Recommendations for Other Services       Precautions / Restrictions Precautions Precautions: Fall Precaution Comments: Pt has been AMB freely in room, managing IV pole, prior to entry; LUQ JP drain; xiphoid to pubis incision Restrictions Weight Bearing Restrictions: No Other Position/Activity Restrictions: pt has an ABD brace pillow    Mobility  Bed Mobility Overal bed mobility: Modified Independent Bed Mobility: Supine to Sit     Supine to sit:  Supervision     General bed mobility comments: Pt refuse review of Log Roll education this date, reports pain is too severe at this time.   Transfers Overall transfer level: Modified independent Equipment used: None                Ambulation/Gait   Gait Distance (Feet): 620 Feet Assistive device: Rolling walker (2 wheeled) Gait Pattern/deviations: Antalgic(stops freqeuntly 2/2 pain. ) Gait velocity: 0.56m/s this date; 0.39m/s on 5/3    General Gait Details: generally moving faster but stops more frequently 2/2/ pain than 1DA.    Stairs Stairs: Yes Stairs assistance: Min guard Stair Management: One rail Left;Alternating pattern Number of Stairs: 12     Wheelchair Mobility    Modified Rankin (Stroke Patients Only)       Balance Overall balance assessment: No apparent balance deficits (not formally assessed)                                          Cognition Arousal/Alertness: Awake/alert Behavior During Therapy: WFL for tasks assessed/performed Overall Cognitive Status: Within Functional Limits for tasks assessed                                        Exercises      General Comments        Pertinent Vitals/Pain Pain Score: 8  Pain Location: ABD pain with movement Pain Descriptors / Indicators: Sharp Pain Intervention(s): Limited activity within patient's tolerance;Monitored during session;Patient requesting pain meds-RN notified;Premedicated before session;Other (comment)(Pt reported having no pain meds, then encouraged to use pain meds appropriately to improve ability to mobilize. Pt agreeable, RN asked for meds in session, who reports pt had just recently had pain meds. )    Home Living                      Prior Function            PT Goals (current goals can now be found in the care plan section) Acute Rehab PT Goals Patient Stated Goal: Regain mobility and strength  PT Goal Formulation: With  patient Time For Goal Achievement: 05/03/19 Potential to Achieve Goals: Good Progress towards PT goals: Progressing toward goals    Frequency    Min 2X/week      PT Plan Current plan remains appropriate    Co-evaluation              AM-PAC PT "6 Clicks" Mobility   Outcome Measure  Help needed turning from your back to your side while in a flat bed without using bedrails?: None Help needed moving from lying on your back to sitting on the side of a flat bed without using bedrails?: None Help needed moving to and from a bed to a chair (including a wheelchair)?: None Help needed standing up from a chair using your arms (e.g., wheelchair or bedside chair)?: None Help needed to walk in hospital room?: None Help needed climbing 3-5 steps with a railing? : A Little 6 Click Score: 23    End of Session   Activity Tolerance: Patient tolerated treatment well Patient left: in chair;with call bell/phone within reach Nurse Communication: Mobility status;Other (comment)(pain meds) PT Visit Diagnosis: Other abnormalities of gait and mobility (R26.89);Difficulty in walking, not elsewhere classified (R26.2);Muscle weakness (generalized) (M62.81);Pain Pain - Right/Left: (central ) Pain - part of body: (lower ABD)     Time: 4982-6415 PT Time Calculation (min) (ACUTE ONLY): 16 min  Charges:  $Therapeutic Exercise: 8-22 mins                10:20 AM, 04/20/19 Etta Grandchild, PT, DPT Physical Therapist - [facility]  [phone] ([place])     Webster C 04/20/2019, 10:17 AM

## 2019-04-21 ENCOUNTER — Ambulatory Visit (INDEPENDENT_AMBULATORY_CARE_PROVIDER_SITE_OTHER): Payer: Medicare HMO | Admitting: *Deleted

## 2019-04-21 ENCOUNTER — Other Ambulatory Visit: Payer: Self-pay

## 2019-04-21 DIAGNOSIS — R55 Syncope and collapse: Secondary | ICD-10-CM | POA: Diagnosis not present

## 2019-04-22 LAB — CUP PACEART REMOTE DEVICE CHECK
Date Time Interrogation Session: 20200506013801
Implantable Pulse Generator Implant Date: 20190405

## 2019-04-22 NOTE — Anesthesia Postprocedure Evaluation (Signed)
Anesthesia Post Note  Patient: Julie Acevedo  Procedure(s) Performed: EXPLORATORY LAPAROTOMY (N/A )  Patient location during evaluation: PACU Anesthesia Type: General Level of consciousness: awake and alert Pain management: pain level controlled Vital Signs Assessment: post-procedure vital signs reviewed and stable Respiratory status: spontaneous breathing, nonlabored ventilation, respiratory function stable and patient connected to nasal cannula oxygen Cardiovascular status: blood pressure returned to baseline and stable Postop Assessment: no apparent nausea or vomiting Anesthetic complications: no     Last Vitals:  Vitals:   04/19/19 2144 04/20/19 0405  BP: 127/63 (!) 139/58  Pulse: (!) 57 66  Resp: 17 20  Temp: 36.6 C 37 C  SpO2: 98% 97%    Last Pain:  Vitals:   04/20/19 0910  TempSrc:   PainSc: 4                  Molli Barrows

## 2019-04-27 NOTE — Progress Notes (Signed)
[facility] Summary Report / Loop Recorder 

## 2019-05-24 ENCOUNTER — Other Ambulatory Visit: Payer: Self-pay | Admitting: Family Medicine

## 2019-05-24 DIAGNOSIS — M5416 Radiculopathy, lumbar region: Secondary | ICD-10-CM

## 2019-05-25 ENCOUNTER — Ambulatory Visit (INDEPENDENT_AMBULATORY_CARE_PROVIDER_SITE_OTHER): Payer: Medicare HMO | Admitting: *Deleted

## 2019-05-25 DIAGNOSIS — R002 Palpitations: Secondary | ICD-10-CM

## 2019-05-25 DIAGNOSIS — R55 Syncope and collapse: Secondary | ICD-10-CM

## 2019-05-25 LAB — CUP PACEART REMOTE DEVICE CHECK
Date Time Interrogation Session: 20200608014212
Implantable Pulse Generator Implant Date: 20190405

## 2019-05-25 NOTE — Telephone Encounter (Signed)
Please review

## 2019-06-01 NOTE — Progress Notes (Signed)
[facility] Summary Report / Loop Recorder 

## 2019-06-12 NOTE — Progress Notes (Signed)
Patient: Julie Acevedo Female    DOB: [birth year]   68 y.o.   MRN: [id number] Visit Date: 06/15/2019  Today's Provider: Mar Daring, PA-C   Chief Complaint  Patient presents with  . Fatigue   Subjective:    I,Joseline E. Rosas,RMA am acting as a Education administrator for [employer], PA-C.   Virtual Visit via Telephone Note  I connected with Julie Acevedo on 06/15/19 at  2:40 PM EDT by telephone and verified that I am speaking with the correct person using two identifiers.  Location: Patient: home Provider: [facility]   I discussed the limitations, risks, security and privacy concerns of performing an evaluation and management service by telephone and the availability of in person appointments. I also discussed with the patient that there may be a patient responsible charge related to this service. The patient expressed understanding and agreed to proceed.  HPI  Patient with c/o feeling very tired. She reports that she has been having this nagging headache. She does have h/o anemia. She has also noticed some weight loss.   Allergies  Allergen Reactions  . Lotrel [Amlodipine Besy-Benazepril Hcl] Anaphylaxis  . Contrast Media [Iodinated Diagnostic Agents] Swelling and Other (See Comments)    Reaction:  Neck swelling   . Niacin Hives  . Sulfa Antibiotics Hives     Current Outpatient Medications:  .  gabapentin (NEURONTIN) 100 MG capsule, TAKE 2 CAPSULES (200 MG TOTAL) BY MOUTH 3 (THREE) TIMES DAILY., Disp: 540 capsule, Rfl: 1 .  hydrochlorothiazide (HYDRODIURIL) 25 MG tablet, TAKE 1 TABLET (25 MG TOTAL) BY MOUTH DAILY., Disp: 90 tablet, Rfl: 3 .  LINZESS 290 MCG CAPS capsule, TAKE 1 CAPSULE (290 MCG TOTAL) BY MOUTH DAILY BEFORE BREAKFAST. (Patient taking differently: Take 290 mcg by mouth daily before breakfast. ), Disp: 90 capsule, Rfl: 3 .  meloxicam (MOBIC) 15 MG tablet, Take 15 mg by mouth daily. , Disp: , Rfl:  .  metFORMIN (GLUCOPHAGE) 500 MG tablet, Take 1 tablet (500 mg  total) by mouth daily with breakfast., Disp: 90 tablet, Rfl: 0 .  methocarbamol (ROBAXIN) 500 MG tablet, Take 500 mg by mouth 3 (three) times daily., Disp: , Rfl:  .  traZODone (DESYREL) 50 MG tablet, Take 50 mg by mouth at bedtime as needed. , Disp: , Rfl:  .  ALPRAZolam (XANAX) 0.5 MG tablet, Take 1 tablet (0.5 mg total) by mouth at bedtime as needed for anxiety. Take 1 PO, 1 hour before flying (Patient not taking: Reported on 04/16/2019), Disp: 5 tablet, Rfl: 0 .  bisacodyl (DULCOLAX) 5 MG EC tablet, bisacodyl 5 mg tablet,delayed release  TAKE AS DIRECTED, Disp: , Rfl:  .  diclofenac (VOLTAREN) 75 MG EC tablet, diclofenac sodium 75 mg tablet,delayed release  Take 1 tablet twice a day by oral route., Disp: , Rfl:  .  Elastic Bandages & Supports (COMPRESSION & SUPPORT GLOVES L) MISC, , Disp: , Rfl:  .  folic acid (FOLVITE) 1 MG tablet, Take 1 tablet (1 mg total) by mouth daily. (Patient not taking: Reported on 06/15/2019), Disp: 30 tablet, Rfl: 0 .  Melatonin 10 MG TABS, Take 10 mg by mouth at bedtime as needed (for sleep.). , Disp: , Rfl:  .  metoprolol succinate (TOPROL-XL) 25 MG 24 hr tablet, Take 25 mg by mouth daily., Disp: , Rfl:  .  ondansetron (ZOFRAN ODT) 4 MG disintegrating tablet, Take 1 tablet (4 mg total) by mouth every 8 (eight) hours as needed  for nausea or vomiting. (Patient not taking: Reported on 06/15/2019), Disp: 20 tablet, Rfl: 0 .  potassium chloride SA (KLOR-CON M20) 20 MEQ tablet, Klor-Con M20 mEq tablet,extended release, Disp: , Rfl:  .  traMADol (ULTRAM) 50 MG tablet, tramadol 50 mg tablet, Disp: , Rfl:   Review of Systems  Constitutional: Positive for fatigue. Negative for fever.  HENT: Negative for congestion, ear pain, postnasal drip, rhinorrhea, sinus pressure, sore throat and trouble swallowing.   Respiratory: Negative for chest tightness and shortness of breath.   Cardiovascular: Negative for chest pain.  Neurological: Positive for headaches.    Social History    Tobacco Use  . Smoking status: Never Smoker  . Smokeless tobacco: Never Used  Substance Use Topics  . Alcohol use: No      Objective:   BP 116/66   Pulse (!) 57   Temp 98 F (36.7 C)   Wt 138 lb (62.6 kg)   BMI 24.45 kg/m  Vitals:   06/15/19 1418  BP: 116/66  Pulse: (!) 57  Temp: 98 F (36.7 C)  Weight: 138 lb (62.6 kg)     Physical Exam Vitals signs reviewed.  Constitutional:      General: She is not in acute distress.    Appearance: She is well-developed.  Pulmonary:     Effort: Pulmonary effort is normal. No respiratory distress.  Neurological:     Mental Status: She is alert.     No results found for any visits on 06/15/19.     Assessment & Plan     1. Fatigue, unspecified type Patient advised to come into the office for in person evaluation and labs tomorrow.  I discussed the assessment and treatment plan with the patient. The patient was provided an opportunity to ask questions and all were answered. The patient agreed with the plan and demonstrated an understanding of the instructions.   The patient was advised to call back or seek an in-person evaluation if the symptoms worsen or if the condition fails to improve as anticipated.  I provided 15 minutes of non-face-to-face time during this encounter.    Mar Daring, PA-C  [facility]

## 2019-06-15 ENCOUNTER — Encounter: Payer: Self-pay | Admitting: Physician Assistant

## 2019-06-15 ENCOUNTER — Ambulatory Visit (INDEPENDENT_AMBULATORY_CARE_PROVIDER_SITE_OTHER): Payer: Medicare HMO | Admitting: Physician Assistant

## 2019-06-15 VITALS — BP 116/66 | HR 57 | Temp 98.0°F | Wt 138.0 lb

## 2019-06-15 DIAGNOSIS — R5383 Other fatigue: Secondary | ICD-10-CM

## 2019-06-16 ENCOUNTER — Ambulatory Visit (INDEPENDENT_AMBULATORY_CARE_PROVIDER_SITE_OTHER): Payer: Medicare HMO | Admitting: Physician Assistant

## 2019-06-16 ENCOUNTER — Other Ambulatory Visit: Payer: Self-pay

## 2019-06-16 ENCOUNTER — Encounter: Payer: Self-pay | Admitting: Physician Assistant

## 2019-06-16 VITALS — BP 125/72 | HR 63 | Temp 98.3°F | Wt 148.0 lb

## 2019-06-16 DIAGNOSIS — R5383 Other fatigue: Secondary | ICD-10-CM

## 2019-06-16 DIAGNOSIS — E538 Deficiency of other specified B group vitamins: Secondary | ICD-10-CM

## 2019-06-16 DIAGNOSIS — I10 Essential (primary) hypertension: Secondary | ICD-10-CM

## 2019-06-16 DIAGNOSIS — Z9884 Bariatric surgery status: Secondary | ICD-10-CM | POA: Diagnosis not present

## 2019-06-16 DIAGNOSIS — E1122 Type 2 diabetes mellitus with diabetic chronic kidney disease: Secondary | ICD-10-CM | POA: Diagnosis not present

## 2019-06-16 DIAGNOSIS — N182 Chronic kidney disease, stage 2 (mild): Secondary | ICD-10-CM

## 2019-06-16 DIAGNOSIS — D508 Other iron deficiency anemias: Secondary | ICD-10-CM

## 2019-06-16 DIAGNOSIS — E042 Nontoxic multinodular goiter: Secondary | ICD-10-CM | POA: Diagnosis not present

## 2019-06-16 DIAGNOSIS — E559 Vitamin D deficiency, unspecified: Secondary | ICD-10-CM | POA: Diagnosis not present

## 2019-06-16 DIAGNOSIS — R001 Bradycardia, unspecified: Secondary | ICD-10-CM | POA: Diagnosis not present

## 2019-06-16 NOTE — Progress Notes (Signed)
Patient: Julie Acevedo Female    DOB: 25-Nov-1951   68 y.o.   MRN: [id number] Visit Date: 06/16/2019  Today's Provider: Mar Daring, PA-C   Chief Complaint  Patient presents with  . Fatigue  . Bradycardia   Subjective:     HPI  Julie Acevedo is a 68 yr old female that presents today in the office for evaluation of fatigue. Yesterday she did a telephone visit and it was felt that patient needed further evaluation in the office. She reports the fatigue has been worsening over the last few weeks. She states she feels like she just needs to rest more and wants to take naps when she used to never. She does have known history of iron def anemia requiring infusions. She was last seen in 03/2019 and her labs and iron stores were high enough she did not require an infusion. Following that on 04/16/19 she went to the ER with abdominal pain. She had exploratory laparotomy on 04/16/19 for large bowel obstruction. She had lysis of adhesions and had a splenic injury during dissection. She has not had any issues post-operatively. However, it was noted that she had a slight decrease in her hemoglobin postoperatively. Since she is having worsening fatigue it was felt best to have labs checked.   Allergies  Allergen Reactions  . Lotrel [Amlodipine Besy-Benazepril Hcl] Anaphylaxis  . Contrast Media [Iodinated Diagnostic Agents] Swelling and Other (See Comments)    Reaction:  Neck swelling   . Niacin Hives  . Sulfa Antibiotics Hives     Current Outpatient Medications:  .  ALPRAZolam (XANAX) 0.5 MG tablet, Take 1 tablet (0.5 mg total) by mouth at bedtime as needed for anxiety. Take 1 PO, 1 hour before flying (Patient not taking: Reported on 04/16/2019), Disp: 5 tablet, Rfl: 0 .  bisacodyl (DULCOLAX) 5 MG EC tablet, bisacodyl 5 mg tablet,delayed release  TAKE AS DIRECTED, Disp: , Rfl:  .  diclofenac (VOLTAREN) 75 MG EC tablet, diclofenac sodium 75 mg tablet,delayed release  Take 1 tablet twice a day  by oral route., Disp: , Rfl:  .  Elastic Bandages & Supports (COMPRESSION & SUPPORT GLOVES L) MISC, , Disp: , Rfl:  .  folic acid (FOLVITE) 1 MG tablet, Take 1 tablet (1 mg total) by mouth daily. (Patient not taking: Reported on 06/15/2019), Disp: 30 tablet, Rfl: 0 .  gabapentin (NEURONTIN) 100 MG capsule, TAKE 2 CAPSULES (200 MG TOTAL) BY MOUTH 3 (THREE) TIMES DAILY., Disp: 540 capsule, Rfl: 1 .  hydrochlorothiazide (HYDRODIURIL) 25 MG tablet, TAKE 1 TABLET (25 MG TOTAL) BY MOUTH DAILY., Disp: 90 tablet, Rfl: 3 .  LINZESS 290 MCG CAPS capsule, TAKE 1 CAPSULE (290 MCG TOTAL) BY MOUTH DAILY BEFORE BREAKFAST. (Patient taking differently: Take 290 mcg by mouth daily before breakfast. ), Disp: 90 capsule, Rfl: 3 .  Melatonin 10 MG TABS, Take 10 mg by mouth at bedtime as needed (for sleep.). , Disp: , Rfl:  .  meloxicam (MOBIC) 15 MG tablet, Take 15 mg by mouth daily. , Disp: , Rfl:  .  metFORMIN (GLUCOPHAGE) 500 MG tablet, Take 1 tablet (500 mg total) by mouth daily with breakfast., Disp: 90 tablet, Rfl: 0 .  methocarbamol (ROBAXIN) 500 MG tablet, Take 500 mg by mouth 3 (three) times daily., Disp: , Rfl:  .  metoprolol succinate (TOPROL-XL) 25 MG 24 hr tablet, Take 25 mg by mouth daily., Disp: , Rfl:  .  ondansetron (ZOFRAN ODT) 4 MG  disintegrating tablet, Take 1 tablet (4 mg total) by mouth every 8 (eight) hours as needed for nausea or vomiting. (Patient not taking: Reported on 06/15/2019), Disp: 20 tablet, Rfl: 0 .  potassium chloride SA (KLOR-CON M20) 20 MEQ tablet, Klor-Con M20 mEq tablet,extended release, Disp: , Rfl:  .  traMADol (ULTRAM) 50 MG tablet, tramadol 50 mg tablet, Disp: , Rfl:  .  traZODone (DESYREL) 50 MG tablet, Take 50 mg by mouth at bedtime as needed. , Disp: , Rfl:   Review of Systems  Constitutional: Positive for fatigue.  Respiratory: Negative.   Cardiovascular: Negative.   Gastrointestinal: Negative.   Musculoskeletal: Negative.   Neurological: Negative.     Social History    Tobacco Use  . Smoking status: Never Smoker  . Smokeless tobacco: Never Used  Substance Use Topics  . Alcohol use: No      Objective:   BP 125/72 (BP Location: Left Arm, Patient Position: Sitting, Cuff Size: Normal)   Pulse 63   Temp 98.3 F (36.8 C) (Oral)   Wt 148 lb (67.1 kg)   SpO2 99%   BMI 26.22 kg/m  Vitals:   06/16/19 1537  BP: 125/72  Pulse: 63  Temp: 98.3 F (36.8 C)  TempSrc: Oral  SpO2: 99%  Weight: 148 lb (67.1 kg)     Physical Exam Vitals signs reviewed.  Constitutional:      General: She is not in acute distress.    Appearance: Normal appearance. She is well-developed and normal weight. She is not ill-appearing or diaphoretic.  HENT:     Head: Normocephalic and atraumatic.  Eyes:     General: No scleral icterus.    Extraocular Movements: Extraocular movements intact.     Conjunctiva/sclera: Conjunctivae normal.     Pupils: Pupils are equal, round, and reactive to light.  Neck:     Musculoskeletal: Normal range of motion and neck supple.     Thyroid: No thyromegaly.     Vascular: No JVD.     Trachea: No tracheal deviation.  Cardiovascular:     Rate and Rhythm: Regular rhythm. Bradycardia present.     Pulses: Normal pulses.     Heart sounds: Normal heart sounds. No murmur. No friction rub. No gallop.   Pulmonary:     Effort: Pulmonary effort is normal. No respiratory distress.     Breath sounds: Normal breath sounds. No wheezing or rales.  Lymphadenopathy:     Cervical: No cervical adenopathy.  Skin:    Capillary Refill: Capillary refill takes less than 2 seconds.  Neurological:     General: No focal deficit present.     Mental Status: She is alert and oriented to person, place, and time. Mental status is at baseline.     Cranial Nerves: No cranial nerve deficit.     Motor: No weakness.     Gait: Gait normal.  Psychiatric:        Mood and Affect: Mood normal.        Behavior: Behavior normal.        Thought Content: Thought content  normal.        Judgment: Judgment normal.     No results found for any visits on 06/16/19.     Assessment & Plan    1. Other fatigue Since patient has h/o iron def anemia, B12 def, vit d def and multinodular thyroid, all of which could cause fatigue, I will check labs as below. I will f/u pending results of these labs.  If her iron stores are low again I will get her back in with hematology earlier. She is in agreement. She is to call if worsening symptoms occur.  - EKG 12-Lead - CBC w/Diff/Platelet - Fe+TIBC+Fer - Basic Metabolic Panel (BMET) - D62 - Vitamin D (25 hydroxy) - TSH  2. Bradycardia EKG today was normal. Sinus bradycardia rate of 58, no ST changes.  - EKG 12-Lead - CBC w/Diff/Platelet - Fe+TIBC+Fer - Basic Metabolic Panel (BMET) - I29 - Vitamin D (25 hydroxy) - TSH  3. Benign essential HTN Stable. Will check labs as below and f/u pending results. - CBC w/Diff/Platelet - Fe+TIBC+Fer - Basic Metabolic Panel (BMET) - N98 - Vitamin D (25 hydroxy) - TSH  4. Type 2 diabetes mellitus with stage 2 chronic kidney disease, without long-term current use of insulin ([place]) Will check labs as below and f/u pending results. - CBC w/Diff/Platelet - Fe+TIBC+Fer - Basic Metabolic Panel (BMET) - X21 - Vitamin D (25 hydroxy) - TSH  5. Multiple thyroid nodules Will check labs as below and f/u pending results. - CBC w/Diff/Platelet - Fe+TIBC+Fer - Basic Metabolic Panel (BMET) - J94 - Vitamin D (25 hydroxy) - TSH  6. B12 deficiency H/O this and s/p bariatric surgery. Will check labs as below and f/u pending results. - CBC w/Diff/Platelet - Fe+TIBC+Fer - Basic Metabolic Panel (BMET) - R74 - Vitamin D (25 hydroxy) - TSH  7. Avitaminosis D H/O this and s/p bariatric surgery. Will check labs as below and f/u pending results. - CBC w/Diff/Platelet - Fe+TIBC+Fer - Basic Metabolic Panel (BMET) - Y81 - Vitamin D (25 hydroxy) - TSH  8. Iron deficiency anemia  secondary to inadequate dietary iron intake H/O this and previously required infusions. Will check labs as below and f/u pending results. - CBC w/Diff/Platelet - Fe+TIBC+Fer - Basic Metabolic Panel (BMET) - K48 - Vitamin D (25 hydroxy) - TSH  9. Gastric bypass status for obesity See above medical treatment plan. Will check labs as below and f/u pending results. - CBC w/Diff/Platelet - Fe+TIBC+Fer - Basic Metabolic Panel (BMET) - J85 - Vitamin D (25 hydroxy) - TSH     Mar Daring, PA-C  [facility]

## 2019-06-17 ENCOUNTER — Telehealth: Payer: Self-pay

## 2019-06-17 LAB — IRON,TIBC AND FERRITIN PANEL
Ferritin: 122 ng/mL (ref 15–150)
Iron Saturation: 18 % (ref 15–55)
Iron: 41 ug/dL (ref 27–139)
Total Iron Binding Capacity: 231 ug/dL — ABNORMAL LOW (ref 250–450)
UIBC: 190 ug/dL (ref 118–369)

## 2019-06-17 LAB — BASIC METABOLIC PANEL
BUN/Creatinine Ratio: 10 — ABNORMAL LOW (ref 12–28)
BUN: 17 mg/dL (ref 8–27)
CO2: 23 mmol/L (ref 20–29)
Calcium: 9.2 mg/dL (ref 8.7–10.3)
Chloride: 105 mmol/L (ref 96–106)
Creatinine, Ser: 1.73 mg/dL — ABNORMAL HIGH (ref 0.57–1.00)
GFR calc Af Amer: 35 mL/min/{1.73_m2} — ABNORMAL LOW (ref >59)
GFR calc non Af Amer: 30 mL/min/{1.73_m2} — ABNORMAL LOW (ref >59)
Glucose: 83 mg/dL (ref 65–99)
Potassium: 4 mmol/L (ref 3.5–5.2)
Sodium: 142 mmol/L (ref 134–144)

## 2019-06-17 LAB — CBC WITH DIFFERENTIAL/PLATELET
Basophils Absolute: 0 10*3/uL (ref 0.0–0.2)
Basos: 0 %
EOS (ABSOLUTE): 0.1 10*3/uL (ref 0.0–0.4)
Eos: 2 %
Hematocrit: 31.2 % — ABNORMAL LOW (ref 34.0–46.6)
Hemoglobin: 10.2 g/dL — ABNORMAL LOW (ref 11.1–15.9)
Immature Grans (Abs): 0 10*3/uL (ref 0.0–0.1)
Immature Granulocytes: 0 %
Lymphocytes Absolute: 2.2 10*3/uL (ref 0.7–3.1)
Lymphs: 26 %
MCH: 27.8 pg (ref 26.6–33.0)
MCHC: 32.7 g/dL (ref 31.5–35.7)
MCV: 85 fL (ref 79–97)
Monocytes Absolute: 0.6 10*3/uL (ref 0.1–0.9)
Monocytes: 8 %
Neutrophils Absolute: 5.4 10*3/uL (ref 1.4–7.0)
Neutrophils: 64 %
Platelets: 356 10*3/uL (ref 150–450)
RBC: 3.67 x10E6/uL — ABNORMAL LOW (ref 3.77–5.28)
RDW: 13.5 % (ref 11.7–15.4)
WBC: 8.4 10*3/uL (ref 3.4–10.8)

## 2019-06-17 LAB — VITAMIN B12: Vitamin B-12: 293 pg/mL (ref 232–1245)

## 2019-06-17 LAB — VITAMIN D 25 HYDROXY (VIT D DEFICIENCY, FRACTURES): Vit D, 25-Hydroxy: 8.7 ng/mL — ABNORMAL LOW (ref 30.0–100.0)

## 2019-06-17 LAB — TSH: TSH: 1.98 u[IU]/mL (ref 0.450–4.500)

## 2019-06-17 MED ORDER — VITAMIN D (ERGOCALCIFEROL) 1.25 MG (50000 UNIT) PO CAPS: 50000.0000 [IU] | ORAL_CAPSULE | ORAL | 1 refills | Status: DC

## 2019-06-17 NOTE — Addendum Note (Signed)
Addended by: Mar Daring on: 06/17/2019 08:48 AM   Modules accepted: Orders

## 2019-06-17 NOTE — Telephone Encounter (Signed)
Patient advised as directed below. Per patient she doesn't have a kidney doctor.She also asked why are her Kidney declining? She was advised once again to push a lot of fluids and will recheck in two weeks.

## 2019-06-17 NOTE — Telephone Encounter (Signed)
-----   Message from Mar Daring, PA-C sent at 06/17/2019  8:47 AM EDT ----- Hemoglobin did drop subtly from 10.5 after surgery to 10.2. Iron is starting to decrease compared to last check in April, but not drastic enough yet to require an infusion. May benefit from slow release iron supplement over the counter. Kidney function has had a significant decline. Do you have a kidney doctor? I want you to push fluids, do not take aleve or ibuprofen (tylenol is ok) and lets recheck kidney function in 2 weeks. If not improving then I may refer or get you back with a kidney doctor (if you have one already). B12 is holding ok at 293. Vit D is low at 8.7. I will send in Vit D Rx for you. Thyroid is normal.

## 2019-06-17 NOTE — Telephone Encounter (Signed)
Yes we will recheck in 2 weeks

## 2019-06-22 ENCOUNTER — Ambulatory Visit: Payer: Medicare HMO | Admitting: Obstetrics & Gynecology

## 2019-06-22 ENCOUNTER — Emergency Department
Admission: EM | Admit: 2019-06-22 | Discharge: 2019-06-22 | Disposition: A | Discharge status: Home / Self Care | Payer: Medicare HMO | Attending: Emergency Medicine | Admitting: Emergency Medicine

## 2019-06-22 ENCOUNTER — Emergency Department: Payer: Medicare HMO

## 2019-06-22 ENCOUNTER — Other Ambulatory Visit: Payer: Self-pay

## 2019-06-22 ENCOUNTER — Encounter: Payer: Self-pay | Admitting: Emergency Medicine

## 2019-06-22 DIAGNOSIS — R109 Unspecified abdominal pain: Secondary | ICD-10-CM | POA: Diagnosis not present

## 2019-06-22 DIAGNOSIS — Z7984 Long term (current) use of oral hypoglycemic drugs: Secondary | ICD-10-CM | POA: Diagnosis not present

## 2019-06-22 DIAGNOSIS — Z79899 Other long term (current) drug therapy: Secondary | ICD-10-CM | POA: Insufficient documentation

## 2019-06-22 DIAGNOSIS — E119 Type 2 diabetes mellitus without complications: Secondary | ICD-10-CM | POA: Diagnosis not present

## 2019-06-22 DIAGNOSIS — I10 Essential (primary) hypertension: Secondary | ICD-10-CM | POA: Insufficient documentation

## 2019-06-22 DIAGNOSIS — R1084 Generalized abdominal pain: Secondary | ICD-10-CM | POA: Diagnosis not present

## 2019-06-22 DIAGNOSIS — R100 Acute abdomen: Secondary | ICD-10-CM | POA: Diagnosis not present

## 2019-06-22 LAB — CBC
HCT: 34.9 % — ABNORMAL LOW (ref 36.0–46.0)
Hemoglobin: 11.1 g/dL — ABNORMAL LOW (ref 12.0–15.0)
MCH: 28.5 pg (ref 26.0–34.0)
MCHC: 31.8 g/dL (ref 30.0–36.0)
MCV: 89.7 fL (ref 80.0–100.0)
Platelets: 357 10*3/uL (ref 150–400)
RBC: 3.89 MIL/uL (ref 3.87–5.11)
RDW: 13.7 % (ref 11.5–15.5)
WBC: 7.7 10*3/uL (ref 4.0–10.5)
nRBC: 0 % (ref 0.0–0.2)

## 2019-06-22 LAB — COMPREHENSIVE METABOLIC PANEL
ALT: 9 U/L (ref 0–44)
AST: 16 U/L (ref 15–41)
Albumin: 3.9 g/dL (ref 3.5–5.0)
Alkaline Phosphatase: 148 U/L — ABNORMAL HIGH (ref 38–126)
Anion gap: 6 (ref 5–15)
BUN: 28 mg/dL — ABNORMAL HIGH (ref 8–23)
CO2: 28 mmol/L (ref 22–32)
Calcium: 9 mg/dL (ref 8.9–10.3)
Chloride: 108 mmol/L (ref 98–111)
Creatinine, Ser: 1.44 mg/dL — ABNORMAL HIGH (ref 0.44–1.00)
GFR calc Af Amer: 44 mL/min — ABNORMAL LOW (ref >60)
GFR calc non Af Amer: 38 mL/min — ABNORMAL LOW (ref >60)
Glucose, Bld: 91 mg/dL (ref 70–99)
Potassium: 4 mmol/L (ref 3.5–5.1)
Sodium: 142 mmol/L (ref 135–145)
Total Bilirubin: 0.6 mg/dL (ref 0.3–1.2)
Total Protein: 7.7 g/dL (ref 6.5–8.1)

## 2019-06-22 LAB — URINALYSIS, COMPLETE (UACMP) WITH MICROSCOPIC
Bacteria, UA: NONE SEEN
Bilirubin Urine: NEGATIVE
Glucose, UA: NEGATIVE mg/dL
Ketones, ur: NEGATIVE mg/dL
Leukocytes,Ua: NEGATIVE
Nitrite: NEGATIVE
Protein, ur: NEGATIVE mg/dL
Specific Gravity, Urine: 1.006 (ref 1.005–1.030)
pH: 7 (ref 5.0–8.0)

## 2019-06-22 LAB — LIPASE, BLOOD: Lipase: 38 U/L (ref 11–51)

## 2019-06-22 MED ORDER — MORPHINE SULFATE (PF) 4 MG/ML IV SOLN
4.0000 mg | Freq: Once | INTRAVENOUS | Status: AC
  Administered 2019-06-22: 4 mg via INTRAVENOUS
  Filled 2019-06-22: qty 1

## 2019-06-22 MED ORDER — SODIUM CHLORIDE 0.9 % IV BOLUS: 1000.0000 mL | Freq: Once | INTRAVENOUS | Status: DC

## 2019-06-22 MED ORDER — HYDROCODONE-ACETAMINOPHEN 5-325 MG PO TABS: 1.0000 | ORAL_TABLET | ORAL | 0 refills | Status: DC | PRN

## 2019-06-22 MED ORDER — SODIUM CHLORIDE 0.9 % IV BOLUS
1000.0000 mL | Freq: Once | INTRAVENOUS | Status: AC
  Administered 2019-06-22: 1000 mL via INTRAVENOUS

## 2019-06-22 MED ORDER — MORPHINE SULFATE (PF) 4 MG/ML IV SOLN
4.0000 mg | Freq: Once | INTRAVENOUS | Status: AC
  Administered 2019-06-22: 11:00:00 4 mg via INTRAVENOUS
  Filled 2019-06-22: qty 1

## 2019-06-22 MED ORDER — ONDANSETRON HCL 4 MG/2ML IJ SOLN
4.0000 mg | Freq: Once | INTRAMUSCULAR | Status: AC
  Administered 2019-06-22: 4 mg via INTRAVENOUS
  Filled 2019-06-22: qty 2

## 2019-06-22 NOTE — ED Notes (Signed)
Assisted patient to room commode. Patient ambulated hunched over with a steady gait.

## 2019-06-22 NOTE — ED Notes (Signed)
Report given to Amy C

## 2019-06-22 NOTE — ED Provider Notes (Signed)
[facility] Emergency Department Provider Note  Time seen: 11:13 AM  I have reviewed the triage vital signs and the nursing notes.   HISTORY  Chief Complaint Abdominal Pain   HPI Julie Acevedo is a 68 y.o. female with a past medical history of arthritis, diverticulitis, diabetes, hypertension, hyperlipidemia, presents to the emergency department for abdominal pain.  According to the patient over the past 2 weeks she has been experiencing intermittent abdominal pain mostly in the mid/periumbilical abdomen.  Patient states over the past 2 days it has worsened significantly, states nausea but denies any vomiting.  Had a loose bowel movement this morning.  Patient had a small bowel obstruction requiring a surgery in April of this year.  Patient denies any known fever, cough congestion or shortness of breath.  Scribes her pain as moderate dull aching pain in the mid abdomen.  Past Medical History:  Diagnosis Date  . Arthritis   . Diverticulitis   . DM (diabetes mellitus) ([place])    typ e 2  . HTN (hypertension)   . Hyperlipidemia   . Memory difficulties 09/01/2015  . Short-term memory loss     Patient Active Problem List   Diagnosis Date Noted  . Large bowel obstruction ([place]) 04/17/2019  . Acquired trigger finger 03/20/2019  . Iron deficiency anemia 11/21/2018  . Syncope 03/18/2018  . Anemia 03/18/2018  . Exophthalmos 02/22/2018  . Multiple thyroid nodules 02/22/2018  . 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 Pain at surgical site 08/30/2016  . Ventral incisional hernia 08/21/2016  . SBO (small bowel obstruction) ([place])   . Partial small bowel obstruction ([place]) 04/23/2016  . Adjustment disorder with mixed anxiety and depressed mood 04/23/2016  . Memory difficulties 09/01/2015  . Arthritis 06/11/2015  . Back pain, chronic 06/11/2015  . HLD (hyperlipidemia) 06/11/2015  . Cannot sleep 06/11/2015  . L-S radiculopathy 06/11/2015  . Arthritis of knee, degenerative  06/11/2015  . B12 deficiency 06/11/2015  . 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 DM2 (diabetes mellitus, type 2) ([place]) 11/15/2012  . Adiposity 11/21/2009  . Avitaminosis D 11/07/2009  . Narrowing of intervertebral disc space 07/25/2009  . Benign essential HTN 07/25/2009    Past Surgical History:  Procedure Laterality Date  . ABDOMINAL HYSTERECTOMY     due to endometriosis-1 ovary left  . BREAST EXCISIONAL BIOPSY Right 1971   neg  . CARDIAC CATHETERIZATION  2000   no significant CAD per note of Dr Caryl Comes in 2013   . CHOLECYSTECTOMY    . CYSTOCELE REPAIR N/A 03/19/2017   Procedure: ANTERIOR REPAIR (CYSTOCELE);  Surgeon: Bjorn Loser, MD;  Location: [facility];  Service: Urology;  Laterality: N/A;  . CYSTOSCOPY N/A 03/19/2017   Procedure: CYSTOSCOPY;  Surgeon: Bjorn Loser, MD;  Location: [facility];  Service: Urology;  Laterality: N/A;  . EYE SURGERY    . FOOT SURGERY    . HAND SURGERY    . HEMICOLECTOMY    . INSERTION OF MESH N/A 08/21/2016   Procedure: INSERTION OF MESH;  Surgeon: Excell Seltzer, MD;  Location: [facility];  Service: General;  Laterality: N/A;  . LAPAROSCOPIC LYSIS OF ADHESIONS N/A 08/21/2016   Procedure: LAPAROSCOPIC LYSIS OF ADHESIONS;  Surgeon: Excell Seltzer, MD;  Location: [facility];  Service: General;  Laterality: N/A;  . LAPAROSCOPIC REMOVAL ABDOMINAL MASS    . LAPAROTOMY N/A 04/16/2019   Procedure: EXPLORATORY LAPAROTOMY;  Surgeon: Benjamine Sprague, DO;  Location: [facility];  Service: General;  Laterality: N/A;  . LOOP RECORDER INSERTION N/A 03/21/2018   Procedure: LOOP RECORDER INSERTION;  Surgeon: Sanda Klein, MD;  Location: [facility];  Service: Cardiovascular;  Laterality: N/A;  . LUMBAR [place]    . ROUX-EN-Y GASTRIC BYPASS  2010   Dr Hassell Done  . TONSILLECTOMY    . UPPER GI ENDOSCOPY  01/12/16   normal larynx, normal esophagus. gastric bypass with a normal-sized pouch and intact staple  line, normal examined jejunum, otherwise exam was normal  . VENTRAL HERNIA REPAIR N/A 08/21/2016   Procedure: LAPAROSCOPIC VENTRAL HERNIA;  Surgeon: Excell Seltzer, MD;  Location: [facility];  Service: General;  Laterality: N/A;    Prior to Admission medications   Medication Sig Start Date End Date Taking? Authorizing Provider  ALPRAZolam Duanne Moron) 0.5 MG tablet Take 1 tablet (0.5 mg total) by mouth at bedtime as needed for anxiety. Take 1 PO, 1 hour before flying Patient not taking: Reported on 04/16/2019 12/02/18   Jerrol Banana., MD  bisacodyl (DULCOLAX) 5 MG EC tablet bisacodyl 5 mg tablet,delayed release  TAKE AS DIRECTED    [provider]  diclofenac (VOLTAREN) 75 MG EC tablet diclofenac sodium 75 mg tablet,delayed release  Take 1 tablet twice a day by oral route.    [provider]  Elastic Bandages & Supports (COMPRESSION & SUPPORT GLOVES L) [place]  08/19/18   [provider]  folic acid (FOLVITE) 1 MG tablet Take 1 tablet (1 mg total) by mouth daily. Patient not taking: Reported on 06/15/2019 03/21/18   Shelly Coss, MD  gabapentin (NEURONTIN) 100 MG capsule TAKE 2 CAPSULES (200 MG TOTAL) BY MOUTH 3 (THREE) TIMES DAILY. 05/25/19   Jerrol Banana., MD  hydrochlorothiazide (HYDRODIURIL) 25 MG tablet TAKE 1 TABLET (25 MG TOTAL) BY MOUTH DAILY. 11/24/18   Jerrol Banana., MD  LINZESS 290 MCG CAPS capsule TAKE 1 CAPSULE (290 MCG TOTAL) BY MOUTH DAILY BEFORE BREAKFAST. Patient taking differently: Take 290 mcg by mouth daily before breakfast.  03/11/19   Jerrol Banana., MD  Melatonin 10 MG TABS Take 10 mg by mouth at bedtime as needed (for sleep.).     [provider]  meloxicam (MOBIC) 15 MG tablet Take 15 mg by mouth daily.  08/13/17   [provider]  metFORMIN (GLUCOPHAGE) 500 MG tablet Take 1 tablet (500 mg total) by mouth daily with breakfast. 11/18/17   Jerrol Banana., MD  methocarbamol (ROBAXIN) 500 MG tablet Take  500 mg by mouth 3 (three) times daily. 05/22/19   [provider]  metoprolol succinate (TOPROL-XL) 25 MG 24 hr tablet Take 25 mg by mouth daily.    [provider]  ondansetron (ZOFRAN ODT) 4 MG disintegrating tablet Take 1 tablet (4 mg total) by mouth every 8 (eight) hours as needed for nausea or vomiting. Patient not taking: Reported on 06/15/2019 04/06/19   Harvest Dark, MD  potassium chloride SA (KLOR-CON M20) 20 MEQ tablet Klor-Con M20 mEq tablet,extended release    [provider]  traMADol (ULTRAM) 50 MG tablet tramadol 50 mg tablet    [provider]  traZODone (DESYREL) 50 MG tablet Take 50 mg by mouth at bedtime as needed.  12/14/18   [provider]  Vitamin D, Ergocalciferol, (DRISDOL) 1.25 MG (50000 UT) CAPS capsule Take 1 capsule (50,000 Units total) by mouth every 7 (seven) days. 06/17/19   Mar Daring, PA-C    Allergies  Allergen Reactions  . Lotrel [Amlodipine Besy-Benazepril Hcl] Anaphylaxis  . Contrast Media [Iodinated Diagnostic Agents] Swelling and Other (See Comments)    Reaction:  Neck swelling   . Niacin Hives  . Sulfa Antibiotics Hives    Family History  Problem Relation Age of Onset  . Cancer Father 14       Lung  . Coronary artery disease Father 59  . COPD Mother   . Diabetes Mother   . Hypertension Mother   . Cancer Paternal Grandmother        Breast  . Breast cancer Paternal Grandmother   . Congestive Heart Failure Maternal Grandmother   . Emphysema Maternal Grandfather   . Heart attack Paternal Grandfather   . Cancer Paternal Aunt        Breast  . Breast cancer Paternal Aunt 35  . Breast cancer Paternal Aunt 63    Social History Social History   Tobacco Use  . Smoking status: Never Smoker  . Smokeless tobacco: Never Used  Substance Use Topics  . Alcohol use: No  . Drug use: No    Review of Systems Constitutional: Negative for fever. Cardiovascular: Negative for chest  pain. Respiratory: Negative for shortness of breath. Gastrointestinal: Moderate mid abdominal pain.  Positive for nausea.  Negative for vomiting.  Positive for loose stool.  Last bowel movement this morning. Genitourinary: Negative for urinary compaints Musculoskeletal: Negative for musculoskeletal complaints Skin: Negative for skin complaints  Neurological: Negative for headache All other ROS negative  ____________________________________________   PHYSICAL EXAM:  VITAL SIGNS: ED Triage Vitals  Enc Vitals Group     BP 06/22/19 1043 (!) 139/58     Pulse Rate 06/22/19 1043 (!) 51     Resp 06/22/19 1043 20     Temp 06/22/19 1043 99.5 F (37.5 C)     Temp Source 06/22/19 1043 Oral     SpO2 06/22/19 1043 100 %     Weight 06/22/19 1044 153 lb (69.4 kg)     Height 06/22/19 1044 5\' 3"  (1.6 m)     Head Circumference --      Peak Flow --      Pain Score 06/22/19 1043 10     Pain Loc --      Pain Edu? --      Excl. in [place]? --     Constitutional: Alert and oriented. Well appearing and in no distress. Eyes: Normal exam ENT      Head: Normocephalic and atraumatic.      Mouth/Throat: Mucous membranes are moist. Cardiovascular: Normal rate, regular rhythm. Respiratory: Normal respiratory effort without tachypnea nor retractions. Breath sounds are clear  Gastrointestinal: Soft, moderate diffuse tenderness palpation more so in the mid abdomen.  Mild guarding. Musculoskeletal: Nontender with normal range of motion in all extremities. Neurologic:  Normal speech and language. No gross focal neurologic deficits Skin:  Skin is warm, dry and intact.  Psychiatric: Mood and affect are normal.   ____________________________________________   RADIOLOGY  CT scan is negative  ____________________________________________   INITIAL IMPRESSION / ASSESSMENT AND PLAN / ED COURSE  Pertinent labs & imaging results that were available during my care of the patient were reviewed by me and  considered in my medical decision making (see chart for details).   Presents emergency department for mid abdominal pain ongoing for 2 weeks worse over the past 2 days.  Differential this time would include small bowel obstruction, gastroenteritis, enteritis, colitis or diverticulitis.  We will check labs,  treat pain and nausea, we will proceed with CT abdomen and pelvis to further evaluate.  Patient agreeable to plan of care.  Patient's work-up is largely within normal limits.  CT scan is negative.  Urinalysis is pending.  Patient stated she saw some blood in her stool, performed a rectal exam which did show red in her stool however it was guaiac negative.  Nontender exam.  We will await the patient's urinalysis we will continue to treat pain and IV hydrate while awaiting urinalysis results.  Overall patient appears well.  Urinalysis has resulted negative as well.  Overall reassuring work-up.  We will discharge with short course of pain medication of the patient follow-up with her doctor.  I discussed strict return precautions, patient agreeable to plan.  Julie Acevedo was evaluated in Emergency Department on 06/22/2019 for the symptoms described in the history of present illness. She was evaluated in the context of the global COVID-19 pandemic, which necessitated consideration that the patient might be at risk for infection with the SARS-CoV-2 virus that causes COVID-19. Institutional protocols and algorithms that pertain to the evaluation of patients at risk for COVID-19 are in a state of rapid change based on information released by regulatory bodies including the CDC and federal and state organizations. These policies and algorithms were followed during the patient's care in the ED.  ____________________________________________   FINAL CLINICAL IMPRESSION(S) / ED DIAGNOSES  Abdominal pain   Harvest Dark, MD 06/22/19 1449

## 2019-06-22 NOTE — ED Notes (Addendum)
Patient ambulated independently to the room commode. Patient states she had a bowel movement that had blood in it. Patient then flushed the commode.  Dr. Kerman Passey aware.

## 2019-06-22 NOTE — ED Notes (Signed)
Patient transported to CT scan . 

## 2019-06-22 NOTE — ED Triage Notes (Signed)
Pt states she had abdominal surgery for a blockage in April and started hurting in the same area about 3 weeks ago. Pt states the pain is shooting in nature and intermittent. Pt appears uncomfortable in triage.

## 2019-06-22 NOTE — ED Notes (Signed)
Dr. Luis Abed aware of vital signs prior to morphine administration.

## 2019-06-22 NOTE — ED Notes (Signed)
Pt signed e signature.  Iv dced  Pt alert.

## 2019-06-22 NOTE — ED Notes (Signed)
Patient using hospital phone to call her husband.

## 2019-06-22 NOTE — ED Notes (Signed)
Patient was informed that her daughter wanted an update.

## 2019-06-23 ENCOUNTER — Telehealth: Payer: Self-pay | Admitting: *Deleted

## 2019-06-23 NOTE — Telephone Encounter (Signed)

## 2019-06-24 ENCOUNTER — Telehealth (INDEPENDENT_AMBULATORY_CARE_PROVIDER_SITE_OTHER): Payer: Medicare HMO | Admitting: Cardiovascular Disease

## 2019-06-24 DIAGNOSIS — Z95818 Presence of other cardiac implants and grafts: Secondary | ICD-10-CM | POA: Diagnosis not present

## 2019-06-24 DIAGNOSIS — N182 Chronic kidney disease, stage 2 (mild): Secondary | ICD-10-CM | POA: Diagnosis not present

## 2019-06-24 DIAGNOSIS — Z9884 Bariatric surgery status: Secondary | ICD-10-CM | POA: Diagnosis not present

## 2019-06-24 DIAGNOSIS — I10 Essential (primary) hypertension: Secondary | ICD-10-CM

## 2019-06-24 DIAGNOSIS — I129 Hypertensive chronic kidney disease with stage 1 through stage 4 chronic kidney disease, or unspecified chronic kidney disease: Secondary | ICD-10-CM | POA: Diagnosis not present

## 2019-06-24 DIAGNOSIS — E782 Mixed hyperlipidemia: Secondary | ICD-10-CM | POA: Diagnosis not present

## 2019-06-24 DIAGNOSIS — R55 Syncope and collapse: Secondary | ICD-10-CM

## 2019-06-24 DIAGNOSIS — E1122 Type 2 diabetes mellitus with diabetic chronic kidney disease: Secondary | ICD-10-CM

## 2019-06-24 MED ORDER — METOPROLOL SUCCINATE ER 25 MG PO TB24: 12.5000 mg | ORAL_TABLET | Freq: Every day | ORAL | Status: DC

## 2019-06-24 NOTE — Progress Notes (Addendum)
Virtual Visit via Telephone Note   This visit type was conducted due to national recommendations for restrictions regarding the COVID-19 Pandemic (e.g. social distancing) in an effort to limit this patient's exposure and mitigate transmission in our community.  Due to her co-morbid illnesses, this patient is at least at moderate risk for complications without adequate follow up.  This format is felt to be most appropriate for this patient at this time.  The patient did not have access to video technology/had technical difficulties with video requiring transitioning to audio format only (telephone).  All issues noted in this document were discussed and addressed.  No physical exam could be performed with this format.  Please refer to the patient's chart for her  consent to telehealth for [facility].   Date:  06/24/2019   ID:  Julie Acevedo, DOB [birth year], MRN [id number]  Patient Location: Home Provider Location: Office  PCP:  Jerrol Banana., MD  Cardiologist:  Yolonda Kida, MD /[place] (ILR) Electrophysiologist:  None   Evaluation Performed:  Follow-Up Visit  Chief Complaint:  Dizziness  History of Present Illness:    Julie Acevedo is a 68 y.o. female with history of a single syncopal event in April 2018 with some features concerning for arrhythmia.  She also has diet-controlled diabetes mellitus with chronic kidney disease (stage III), mild hypertension, occasional palpitations and hyperlipidemia.  She does not have known coronary or peripheral vascular disease.  Since loop recorder implantation she has not had any new syncopal events.  Recently she has had a lot more episodes of dizziness.  All of them have a pattern consistent with orthostatic hypotension.  Most commonly they occur after she stands up from a kneeling position, while working on her flower beds.  She now has a long chair that she carries with her so that when she is gets up she will sit down for a few  minutes before standing.  This takes care of the problem to a large degree.  Her palpitations are bothering her very little recently.  They are infrequent and most often noticeable late in the evening.  She takes a low-dose of metoprolol succinate and consistently has bradycardia, with a heart rate typically in the mid to high 40s.  The patient specifically denies any chest pain at rest or with exertion, dyspnea at rest or with exertion, orthopnea, paroxysmal nocturnal dyspnea, syncope, focal neurological deficits, intermittent claudication, lower extremity edema, unexplained weight gain, cough, hemoptysis or wheezing.   The patient does not have symptoms concerning for COVID-19 infection (fever, chills, cough, or new shortness of breath).    Past Medical History:  Diagnosis Date  . Arthritis   . Diverticulitis   . DM (diabetes mellitus) ([place])    typ e 2  . HTN (hypertension)   . Hyperlipidemia   . Memory difficulties 09/01/2015  . Short-term memory loss    Past Surgical History:  Procedure Laterality Date  . ABDOMINAL HYSTERECTOMY     due to endometriosis-1 ovary left  . BREAST EXCISIONAL BIOPSY Right 1971   neg  . CARDIAC CATHETERIZATION  2000   no significant CAD per note of Dr Caryl Comes in 2013   . CHOLECYSTECTOMY    . CYSTOCELE REPAIR N/A 03/19/2017   Procedure: ANTERIOR REPAIR (CYSTOCELE);  Surgeon: Bjorn Loser, MD;  [facility]: [facility];  Service: Urology;  Laterality: N/A;  . CYSTOSCOPY N/A 03/19/2017   Procedure: CYSTOSCOPY;  Surgeon: Bjorn Loser, MD;  [facility];  Service:  Urology;  Laterality: N/A;  . EYE SURGERY    . FOOT SURGERY    . HAND SURGERY    . HEMICOLECTOMY    . INSERTION OF MESH N/A 08/21/2016   Procedure: INSERTION OF MESH;  Surgeon: Excell Seltzer, MD;  [facility];  Service: General;  Laterality: N/A;  . LAPAROSCOPIC LYSIS OF ADHESIONS N/A 08/21/2016   Procedure: LAPAROSCOPIC LYSIS OF ADHESIONS;  Surgeon: Excell Seltzer, MD;  [facility];  Service: General;  Laterality: N/A;  . LAPAROSCOPIC REMOVAL ABDOMINAL MASS    . LAPAROTOMY N/A 04/16/2019   Procedure: EXPLORATORY LAPAROTOMY;  Surgeon: Benjamine Sprague, DO;  Location: [facility] ORS;  Service: General;  Laterality: N/A;  . LOOP RECORDER INSERTION N/A 03/21/2018   Procedure: LOOP RECORDER INSERTION;  Surgeon: Sanda Klein, MD;  Location: [place] [facility];  Service: Cardiovascular;  Laterality: N/A;  . LUMBAR [place]    . ROUX-EN-Y GASTRIC BYPASS  2010   Dr Hassell Done  . TONSILLECTOMY    . UPPER GI ENDOSCOPY  01/12/16   normal larynx, normal esophagus. gastric bypass with a normal-sized pouch and intact staple line, normal examined jejunum, otherwise exam was normal  . VENTRAL HERNIA REPAIR N/A 08/21/2016   Procedure: LAPAROSCOPIC VENTRAL HERNIA;  Surgeon: Excell Seltzer, MD;  [facility];  Service: General;  Laterality: N/A;     Current Meds  Medication Sig  . Elastic Bandages & Supports (COMPRESSION & SUPPORT GLOVES L) MISC   . folic acid (FOLVITE) 1 MG tablet Take 1 tablet (1 mg total) by mouth daily.  Marland Kitchen gabapentin (NEURONTIN) 100 MG capsule TAKE 2 CAPSULES (200 MG TOTAL) BY MOUTH 3 (THREE) TIMES DAILY.  . hydrochlorothiazide (HYDRODIURIL) 25 MG tablet TAKE 1 TABLET (25 MG TOTAL) BY MOUTH DAILY.  Marland Kitchen LINZESS 290 MCG CAPS capsule TAKE 1 CAPSULE (290 MCG TOTAL) BY MOUTH DAILY BEFORE BREAKFAST. (Patient taking differently: Take 290 mcg by mouth daily before breakfast. )  . meloxicam (MOBIC) 15 MG tablet Take 15 mg by mouth daily.   . metFORMIN (GLUCOPHAGE) 500 MG tablet Take 1 tablet (500 mg total) by mouth daily with breakfast.  . methocarbamol (ROBAXIN) 500 MG tablet Take 500 mg by mouth 3 (three) times daily.  . metoprolol succinate (TOPROL-XL) 25 MG 24 hr tablet Take 25 mg by mouth daily.  . ondansetron (ZOFRAN ODT) 4 MG disintegrating tablet Take 1 tablet (4 mg total) by mouth every 8 (eight) hours as needed for nausea or vomiting.  . traMADol (ULTRAM) 50 MG tablet  Take 50 mg by mouth as needed.   . traZODone (DESYREL) 50 MG tablet Take 50 mg by mouth at bedtime as needed.   . Vitamin D, Ergocalciferol, (DRISDOL) 1.25 MG (50000 UT) CAPS capsule Take 1 capsule (50,000 Units total) by mouth every 7 (seven) days.  . [DISCONTINUED] ALPRAZolam (XANAX) 0.5 MG tablet Take 1 tablet (0.5 mg total) by mouth at bedtime as needed for anxiety. Take 1 PO, 1 hour before flying  . [DISCONTINUED] bisacodyl (DULCOLAX) 5 MG EC tablet bisacodyl 5 mg tablet,delayed release  TAKE AS DIRECTED  . [DISCONTINUED] diclofenac (VOLTAREN) 75 MG EC tablet diclofenac sodium 75 mg tablet,delayed release  Take 1 tablet twice a day by oral route.  . [DISCONTINUED] HYDROcodone-acetaminophen (NORCO/VICODIN) 5-325 MG tablet Take 1 tablet by mouth every 4 (four) hours as needed.  . [DISCONTINUED] Melatonin 10 MG TABS Take 10 mg by mouth at bedtime as needed (for sleep.).   . [DISCONTINUED] potassium chloride SA (KLOR-CON M20)  20 MEQ tablet [place]-Con M20 mEq tablet,extended release     Allergies:   Lotrel [amlodipine besy-benazepril hcl], Contrast media [iodinated diagnostic agents], Niacin, and Sulfa antibiotics   Social History   Tobacco Use  . Smoking status: Never Smoker  . Smokeless tobacco: Never Used  Substance Use Topics  . Alcohol use: No  . Drug use: No     Family Hx: The patient's family history includes Breast cancer in her paternal grandmother; Breast cancer (age of onset: 41) in her paternal aunt; Breast cancer (age of onset: 4) in her paternal aunt; COPD in her mother; Cancer in her paternal aunt and paternal grandmother; Cancer (age of onset: 76) in her father; Congestive Heart Failure in her maternal grandmother; Coronary artery disease (age of onset: 57) in her father; Diabetes in her mother; Emphysema in her maternal grandfather; Heart attack in her paternal grandfather; Hypertension in her mother.  ROS:   Please see the history of present illness.     All other  systems reviewed and are negative.   Prior CV studies:   The following studies were reviewed today:  Multiple loop recorder downloads, with any arrhythmia, most recently June 8  Labs/Other Tests and Data Reviewed:    EKG:  An ECG dated 06/16/2019 was personally reviewed today and demonstrated:  Sinus bradycardia otherwise normal  Recent Labs: 06/16/2019: TSH 1.980 06/22/2019: ALT 9; BUN 28; Creatinine, Ser 1.44; Hemoglobin 11.1; Platelets 357; Potassium 4.0; Sodium 142   Recent Lipid Panel Lab Results  Component Value Date/Time   CHOL 133 06/26/2018 09:23 AM   CHOL 154 01/18/2014 06:32 AM   TRIG 51 06/26/2018 09:23 AM   TRIG 53 01/18/2014 06:32 AM   HDL 63 06/26/2018 09:23 AM   HDL 74 (H) 01/18/2014 06:32 AM   CHOLHDL 2.0 11/16/2012 05:00 AM   LDLCALC 60 06/26/2018 09:23 AM   LDLCALC 69 01/18/2014 06:32 AM    Wt Readings from Last 3 Encounters:  06/24/19 143 lb (64.9 kg)  06/22/19 153 lb (69.4 kg)  06/16/19 148 lb (67.1 kg)     Objective:    Vital Signs:  BP 119/66   Pulse (!) 49   Ht 5\' 3"  (1.6 m)   Wt 143 lb (64.9 kg)   BMI 25.33 kg/m    VITAL SIGNS:  reviewed Unable to examine  ASSESSMENT & PLAN:    1. Benign essential HTN   2. Type 2 diabetes mellitus with stage 2 chronic kidney disease, without long-term current use of insulin ([place])   3. Gastric bypass status for obesity   4. Mixed hyperlipidemia   5. Syncope, unspecified syncope type   6. Status post placement of implantable loop recorder      1. Syncope: No recurrent events in over 12 months and no meaningful arrhythmia recorded on her loop recorder.  Current symptoms are suggestive of orthostatic hypotension, and it is possible that that is what caused her syncopal event a year ago.  She did have a little bit of a prodrome at that time.  2. Orthostatic hypotension : She also has significant bradycardia so I asked her to cut her metoprolol dose in half to 12.5 mg once daily.  Asked her to stay  well-hydrated, especially when working outside in the garden.  If the symptoms do not improve consider stopping her diuretic and switching to an alternative agent.  3. HTN: Excellent control.  I suspect she will maintain good blood pressure control even on the lower dose of beta-blocker.  4. ILR: Normal device function.  Continue monthly downloads.  No arrhythmia to date. 5. HLP: Excellent lipid profile roughly 1 year ago, monitored by PCP and Dr. Clayborn Bigness. 6. CKD 3: Had transient worsening of her renal function in June, improved and back to baseline by the time she had repeat labs earlier this week.  Baseline GFR seems to be around 40-45.  7. DM: Excellent control.  Only on metformin.  COVID-19 Education: The signs and symptoms of COVID-19 were discussed with the patient and how to seek care for testing (follow up with PCP or arrange E-visit).  The importance of social distancing was discussed today.  Time:   Today, I have spent 21 minutes with the patient with telehealth technology discussing the above problems.     Medication Adjustments/Labs and Tests Ordered: Current medicines are reviewed at length with the patient today.  Concerns regarding medicines are outlined above.   Patient Instructions  Medication Instructions:  DECREASE the Metoprolol Succinate to 12.5 mg (half a tablet) once daily  If you need a refill on your cardiac medications before your next appointment, please call your pharmacy.   Lab work: None ordered  Testing/Procedures: None ordered  Follow-Up: At [facility], you and your health needs are our priority.  As part of our continuing mission to provide you with exceptional heart care, we have created designated Provider Care Teams.  These Care Teams include your primary Cardiologist (physician) and Advanced Practice Providers (APPs -  Physician Assistants and Nurse Practitioners) who all work together to provide you with the care you need, when you need it.  You will need a follow up appointment in 12 months.  Please call our office 2 months in advance to schedule this appointment.  You may see Sanda Klein, MD or one of the following Advanced Practice Providers on your designated Care Team: [place], [place] . Fabian Sharp, PA-C       Tests Ordered: No orders of the defined types were placed in this encounter.  Follow Up:  Virtual Visit or In Person 1 year  Signed, Sanda Klein, MD  06/24/2019 9:17 AM    [place]

## 2019-06-24 NOTE — Patient Instructions (Signed)
Medication Instructions:  DECREASE the Metoprolol Succinate to 12.5 mg (half a tablet) once daily  If you need a refill on your cardiac medications before your next appointment, please call your pharmacy.   Lab work: None ordered  Testing/Procedures: None ordered  Follow-Up: At [facility], you and your health needs are our priority.  As part of our continuing mission to provide you with exceptional heart care, we have created designated Provider Care Teams.  These Care Teams include your primary Cardiologist (physician) and Advanced Practice Providers (APPs -  Physician Assistants and Nurse Practitioners) who all work together to provide you with the care you need, when you need it. You will need a follow up appointment in 12 months.  Please call our office 2 months in advance to schedule this appointment.  You may see Sanda Klein, MD or one of the following Advanced Practice Providers on your designated Care Team: [place], [place] . Fabian Sharp, PA-C

## 2019-06-26 ENCOUNTER — Ambulatory Visit (INDEPENDENT_AMBULATORY_CARE_PROVIDER_SITE_OTHER): Payer: Medicare HMO | Admitting: *Deleted

## 2019-06-26 DIAGNOSIS — R55 Syncope and collapse: Secondary | ICD-10-CM

## 2019-06-27 LAB — CUP PACEART REMOTE DEVICE CHECK
Date Time Interrogation Session: 20200711020836
Implantable Pulse Generator Implant Date: 20190405

## 2019-06-29 NOTE — Progress Notes (Signed)
[facility] Summary Report / Loop Recorder 

## 2019-07-08 ENCOUNTER — Telehealth: Payer: Medicare HMO | Admitting: Cardiovascular Disease

## 2019-07-29 ENCOUNTER — Ambulatory Visit (INDEPENDENT_AMBULATORY_CARE_PROVIDER_SITE_OTHER): Payer: Medicare HMO | Admitting: *Deleted

## 2019-07-29 DIAGNOSIS — R55 Syncope and collapse: Secondary | ICD-10-CM

## 2019-07-30 LAB — CUP PACEART REMOTE DEVICE CHECK
Date Time Interrogation Session: 20200813071322
Implantable Pulse Generator Implant Date: 20190405

## 2019-07-30 NOTE — Progress Notes (Signed)
[place]  [phone] [phone]) [phone]  ID: [name] OB: 12-Aug-1951  MR#: [id number]  SLH#:[id number]  Patient Care Team: Jerrol Banana., MD as PCP - General (Family Medicine) Yolonda Kida, MD as PCP - Cardiology (Cardiology) Lorelee Cover., MD as Consulting Physician (Ophthalmology) Bjorn Loser, MD as Consulting Physician (Urology) Bary Castilla Forest Gleason, MD as Consulting Physician (General Surgery)   CHIEF COMPLAINT: Iron deficiency anemia.  INTERVAL HISTORY: Patient returns to clinic today for repeat laboratory work, further evaluation, and consideration of additional IV Feraheme.  She continues to complain of being persistently cold and has chronic weakness and fatigue.  She has no neurologic complaints.  She denies any recent fevers or illnesses.  She has a fair appetite, but denies weight loss.  She has no chest pain, cough, hemoptysis, or shortness of breath.  She denies any nausea, vomiting, constipation, or diarrhea.  She denies any melena or hematochezia.  She has no urinary complaints.  Patient offers no further specific complaints today.  REVIEW OF SYSTEMS:   Review of Systems  Constitutional: Positive for malaise/fatigue. Negative for fever and weight loss.  Respiratory: Negative.  Negative for cough and shortness of breath.   Cardiovascular: Negative.  Negative for chest pain and leg swelling.  Gastrointestinal: Negative.  Negative for abdominal pain, blood in stool and melena.  Genitourinary: Negative.  Negative for hematuria.  Musculoskeletal: Negative.  Negative for back pain.  Skin: Negative.  Negative for rash.  Neurological: Positive for weakness. Negative for dizziness, focal weakness and headaches.  Psychiatric/Behavioral: Negative.  The patient is not nervous/anxious.     As per HPI. Otherwise, a complete review of systems is negative.  PAST MEDICAL HISTORY: Past Medical History:  Diagnosis Date  .  Arthritis   . Diverticulitis   . DM (diabetes mellitus) ([place])    typ e 2  . HTN (hypertension)   . Hyperlipidemia   . Memory difficulties 09/01/2015  . Short-term memory loss     PAST SURGICAL HISTORY: Past Surgical History:  Procedure Laterality Date  . ABDOMINAL HYSTERECTOMY     due to endometriosis-1 ovary left  . BREAST EXCISIONAL BIOPSY Right 1971   neg  . CARDIAC CATHETERIZATION  2000   no significant CAD per note of Dr Caryl Comes in 2013   . CHOLECYSTECTOMY    . CYSTOCELE REPAIR N/A 03/19/2017   Procedure: ANTERIOR REPAIR (CYSTOCELE);  Surgeon: Bjorn Loser, MD;  [facility];  Service: Urology;  Laterality: N/A;  . CYSTOSCOPY N/A 03/19/2017   Procedure: CYSTOSCOPY;  Surgeon: Bjorn Loser, MD;  [facility];  Service: Urology;  Laterality: N/A;  . EYE SURGERY    . FOOT SURGERY    . HAND SURGERY    . HEMICOLECTOMY    . INSERTION OF MESH N/A 08/21/2016   Procedure: INSERTION OF MESH;  Surgeon: Excell Seltzer, MD;  [facility];  Service: General;  Laterality: N/A;  . LAPAROSCOPIC LYSIS OF ADHESIONS N/A 08/21/2016   Procedure: LAPAROSCOPIC LYSIS OF ADHESIONS;  Surgeon: Excell Seltzer, MD;  [facility];  Service: General;  Laterality: N/A;  . LAPAROSCOPIC REMOVAL ABDOMINAL MASS    . LAPAROTOMY N/A 04/16/2019   Procedure: EXPLORATORY LAPAROTOMY;  Surgeon: Benjamine Sprague, DO;  Location: [facility];  Service: General;  Laterality: N/A;  . LOOP RECORDER INSERTION N/A 03/21/2018   Procedure: LOOP RECORDER INSERTION;  Surgeon: Sanda Klein, MD;  Location: [place] [facility];  Service: Cardiovascular;  Laterality: N/A;  . LUMBAR  [place] SURGERY    . ROUX-EN-Y GASTRIC BYPASS  2010   Dr Hassell Done  . TONSILLECTOMY    . UPPER GI ENDOSCOPY  01/12/16   normal larynx, normal esophagus. gastric bypass with a normal-sized pouch and intact staple line, normal examined jejunum, otherwise exam was normal  . VENTRAL HERNIA REPAIR N/A 08/21/2016   Procedure: LAPAROSCOPIC VENTRAL HERNIA;   Surgeon: Excell Seltzer, MD;  [facility];  Service: General;  Laterality: N/A;    FAMILY HISTORY: Family History  Problem Relation Age of Onset  . Cancer Father 42       Lung  . Coronary artery disease Father 89  . COPD Mother   . Diabetes Mother   . Hypertension Mother   . Cancer Paternal Grandmother        Breast  . Breast cancer Paternal Grandmother   . Congestive Heart Failure Maternal Grandmother   . Emphysema Maternal Grandfather   . Heart attack Paternal Grandfather   . Cancer Paternal Aunt        Breast  . Breast cancer Paternal Aunt 61  . Breast cancer Paternal Aunt 50    ADVANCED DIRECTIVES (Y/N):  N  HEALTH MAINTENANCE: Social History   Tobacco Use  . Smoking status: Never Smoker  . Smokeless tobacco: Never Used  Substance Use Topics  . Alcohol use: No  . Drug use: No     Colonoscopy:  PAP:  Bone density:  Lipid panel:  Allergies  Allergen Reactions  . Lotrel [Amlodipine Besy-Benazepril Hcl] Anaphylaxis  . Contrast Media [Iodinated Diagnostic Agents] Swelling and Other (See Comments)    Reaction:  Neck swelling   . Niacin Hives  . Sulfa Antibiotics Hives    Current Outpatient Medications  Medication Sig Dispense Refill  . Elastic Bandages & Supports (COMPRESSION & SUPPORT GLOVES L) MISC     . folic acid (FOLVITE) 1 MG tablet Take 1 tablet (1 mg total) by mouth daily. 30 tablet 0  . gabapentin (NEURONTIN) 100 MG capsule TAKE 2 CAPSULES (200 MG TOTAL) BY MOUTH 3 (THREE) TIMES DAILY. 540 capsule 1  . hydrochlorothiazide (HYDRODIURIL) 25 MG tablet TAKE 1 TABLET (25 MG TOTAL) BY MOUTH DAILY. 90 tablet 3  . LINZESS 290 MCG CAPS capsule TAKE 1 CAPSULE (290 MCG TOTAL) BY MOUTH DAILY BEFORE BREAKFAST. (Patient taking differently: Take 290 mcg by mouth daily before breakfast. ) 90 capsule 3  . meloxicam (MOBIC) 15 MG tablet Take 15 mg by mouth daily.     . metFORMIN (GLUCOPHAGE) 500 MG tablet Take 1 tablet (500 mg total) by mouth daily with  breakfast. 90 tablet 0  . methocarbamol (ROBAXIN) 500 MG tablet Take 500 mg by mouth 3 (three) times daily.    . metoprolol succinate (TOPROL-XL) 25 MG 24 hr tablet Take 0.5 tablets (12.5 mg total) by mouth daily.    . ondansetron (ZOFRAN ODT) 4 MG disintegrating tablet Take 1 tablet (4 mg total) by mouth every 8 (eight) hours as needed for nausea or vomiting. 20 tablet 0  . traMADol (ULTRAM) 50 MG tablet Take 50 mg by mouth as needed.     . traZODone (DESYREL) 50 MG tablet Take 50 mg by mouth at bedtime as needed.     . Vitamin D, Ergocalciferol, (DRISDOL) 1.25 MG (50000 UT) CAPS capsule Take 1 capsule (50,000 Units total) by mouth every 7 (seven) days. 12 capsule 1   No current facility-administered medications for this visit.     OBJECTIVE: Vitals:   08/04/19 1314  BP: 118/72  Pulse: (!) 58  Temp: 98.2 F (36.8 C)     Body mass index is 26.04 kg/m.    ECOG FS:0 - Asymptomatic  General: Well-developed, well-nourished, no acute distress. Eyes: Pink conjunctiva, anicteric sclera. HEENT: Normocephalic, moist mucous membranes. Lungs: Clear to auscultation bilaterally. Heart: Regular rate and rhythm. No rubs, murmurs, or gallops. Abdomen: Soft, nontender, nondistended. No organomegaly noted, normoactive bowel sounds. Musculoskeletal: No edema, cyanosis, or clubbing. Neuro: Alert, answering all questions appropriately. Cranial nerves grossly intact. Skin: No rashes or petechiae noted. Psych: Normal affect.  LAB RESULTS:  Lab Results  Component Value Date   NA 142 06/22/2019   K 4.0 06/22/2019   CL 108 06/22/2019   CO2 28 06/22/2019   GLUCOSE 91 06/22/2019   BUN 28 (H) 06/22/2019   CREATININE 1.44 (H) 06/22/2019   CALCIUM 9.0 06/22/2019   PROT 7.7 06/22/2019   ALBUMIN 3.9 06/22/2019   AST 16 06/22/2019   ALT 9 06/22/2019   ALKPHOS 148 (H) 06/22/2019   BILITOT 0.6 06/22/2019   GFRNONAA 38 (L) 06/22/2019   GFRAA 44 (L) 06/22/2019    Lab Results  Component Value Date    WBC 7.4 08/04/2019   NEUTROABS 4.4 08/04/2019   HGB 10.1 (L) 08/04/2019   HCT 31.8 (L) 08/04/2019   MCV 87.6 08/04/2019   PLT 311 08/04/2019    Lab Results  Component Value Date   IRON 41 06/16/2019   TIBC 231 (L) 06/16/2019   IRONPCTSAT 18 06/16/2019   Lab Results  Component Value Date   FERRITIN 122 06/16/2019    STUDIES: No results found.  ASSESSMENT: Iron deficiency anemia.  PLAN:   1.  Iron deficiency anemia: Patient's hemoglobin has trended down slightly to 10.1 and she is symptomatic.  Iron stores are pending at time of dictation.  Previously, all of her other laboratory work including hemolysis labs, B12, folate, and SPEP were negative or within normal limits.  Patient had a colonoscopy on September 05, 2018 that was reported as normal.  We will proceed with one infusion of 510 mg IV Feraheme today.  She does not require second dose.  Return to clinic in 4 months with repeat laboratory work, further evaluation, and continuation of treatment if necessary.    I spent a total of 30 minutes face-to-face with the patient of which greater than 50% of the visit was spent in counseling and coordination of care as detailed above.   Patient expressed understanding and was in agreement with this plan. She also understands that She can call clinic at any time with any questions, concerns, or complaints.    Lloyd Huger, MD   08/04/2019 1:44 PM

## 2019-08-03 ENCOUNTER — Other Ambulatory Visit: Payer: Self-pay

## 2019-08-04 ENCOUNTER — Inpatient Hospital Stay: Payer: Medicare HMO

## 2019-08-04 ENCOUNTER — Inpatient Hospital Stay (HOSPITAL_BASED_OUTPATIENT_CLINIC_OR_DEPARTMENT_OTHER): Payer: Medicare HMO | Admitting: Oncology

## 2019-08-04 ENCOUNTER — Inpatient Hospital Stay: Payer: Medicare HMO | Attending: Oncology

## 2019-08-04 ENCOUNTER — Other Ambulatory Visit: Payer: Self-pay

## 2019-08-04 ENCOUNTER — Encounter: Payer: Self-pay | Admitting: Oncology

## 2019-08-04 VITALS — BP 118/72 | HR 58 | Temp 98.2°F | Wt 147.0 lb

## 2019-08-04 DIAGNOSIS — R5383 Other fatigue: Secondary | ICD-10-CM | POA: Diagnosis not present

## 2019-08-04 DIAGNOSIS — D509 Iron deficiency anemia, unspecified: Secondary | ICD-10-CM

## 2019-08-04 DIAGNOSIS — R531 Weakness: Secondary | ICD-10-CM | POA: Insufficient documentation

## 2019-08-04 LAB — CBC WITH DIFFERENTIAL/PLATELET
Abs Immature Granulocytes: 0.01 10*3/uL (ref 0.00–0.07)
Basophils Absolute: 0 10*3/uL (ref 0.0–0.1)
Basophils Relative: 0 %
Eosinophils Absolute: 0.3 10*3/uL (ref 0.0–0.5)
Eosinophils Relative: 4 %
HCT: 31.8 % — ABNORMAL LOW (ref 36.0–46.0)
Hemoglobin: 10.1 g/dL — ABNORMAL LOW (ref 12.0–15.0)
Immature Granulocytes: 0 %
Lymphocytes Relative: 30 %
Lymphs Abs: 2.2 10*3/uL (ref 0.7–4.0)
MCH: 27.8 pg (ref 26.0–34.0)
MCHC: 31.8 g/dL (ref 30.0–36.0)
MCV: 87.6 fL (ref 80.0–100.0)
Monocytes Absolute: 0.5 10*3/uL (ref 0.1–1.0)
Monocytes Relative: 7 %
Neutro Abs: 4.4 10*3/uL (ref 1.7–7.7)
Neutrophils Relative %: 59 %
Platelets: 311 10*3/uL (ref 150–400)
RBC: 3.63 MIL/uL — ABNORMAL LOW (ref 3.87–5.11)
RDW: 13.2 % (ref 11.5–15.5)
WBC: 7.4 10*3/uL (ref 4.0–10.5)
nRBC: 0 % (ref 0.0–0.2)

## 2019-08-04 LAB — FERRITIN: Ferritin: 70 ng/mL (ref 11–307)

## 2019-08-04 LAB — IRON AND TIBC
Iron: 38 ug/dL (ref 28–170)
Saturation Ratios: 15 % (ref 10.4–31.8)
TIBC: 249 ug/dL — ABNORMAL LOW (ref 250–450)
UIBC: 211 ug/dL

## 2019-08-04 NOTE — Progress Notes (Signed)
Multiple RN's attempted to get IV access but was unsuccessful. Pt stated that she would like to reschedule and will drink more fluids prior to her next appointment. MD made aware, no new orders given. All questions were answered at this time.   Julie Acevedo [employer]

## 2019-08-04 NOTE — Progress Notes (Signed)
Pt presents to clinic for follow up visit. Pt reports having surgery in May for a bowel obstruction. No concerns at this time.

## 2019-08-04 NOTE — Progress Notes (Unsigned)
Multiple RNs attempted to get IV access but was unsuccessful. Pt stated that she wants to reschedule and will drink more fluids prior to her next appointment. MD made aware, no new orders given. Pt reschedule iron infusion for 08/10/2019. All questions were answered at this time.   Sricharan Lacomb [employer]

## 2019-08-06 NOTE — Progress Notes (Signed)
[facility] Summary Report / Loop Recorder 

## 2019-08-07 ENCOUNTER — Other Ambulatory Visit: Payer: Self-pay

## 2019-08-07 ENCOUNTER — Telehealth: Payer: Self-pay

## 2019-08-07 NOTE — Telephone Encounter (Signed)
Unable to leave a message for patient regarding disconnected monitor 

## 2019-08-10 ENCOUNTER — Other Ambulatory Visit: Payer: Self-pay

## 2019-08-10 ENCOUNTER — Inpatient Hospital Stay: Payer: Medicare HMO

## 2019-08-10 VITALS — BP 116/65 | HR 52 | Temp 97.0°F | Resp 17

## 2019-08-10 DIAGNOSIS — R5383 Other fatigue: Secondary | ICD-10-CM | POA: Diagnosis not present

## 2019-08-10 DIAGNOSIS — D509 Iron deficiency anemia, unspecified: Secondary | ICD-10-CM

## 2019-08-10 DIAGNOSIS — R531 Weakness: Secondary | ICD-10-CM | POA: Diagnosis not present

## 2019-08-10 MED ORDER — SODIUM CHLORIDE 0.9 % IV SOLN
Freq: Once | INTRAVENOUS | Status: AC
  Administered 2019-08-10: 14:00:00 via INTRAVENOUS
  Filled 2019-08-10: qty 250

## 2019-08-10 MED ORDER — SODIUM CHLORIDE 0.9 % IV SOLN
510.0000 mg | Freq: Once | INTRAVENOUS | Status: AC
  Administered 2019-08-10: 510 mg via INTRAVENOUS
  Filled 2019-08-10: qty 17

## 2019-08-13 ENCOUNTER — Other Ambulatory Visit: Payer: Self-pay

## 2019-08-13 NOTE — Telephone Encounter (Signed)
Patient requesting refill on trazodone.

## 2019-08-14 ENCOUNTER — Other Ambulatory Visit: Payer: Self-pay | Admitting: Family Medicine

## 2019-08-14 MED ORDER — TRAZODONE HCL 50 MG PO TABS: 50.0000 mg | ORAL_TABLET | Freq: Every evening | ORAL | 11 refills | Status: DC | PRN

## 2019-08-31 ENCOUNTER — Ambulatory Visit (INDEPENDENT_AMBULATORY_CARE_PROVIDER_SITE_OTHER): Payer: Medicare HMO | Admitting: *Deleted

## 2019-08-31 DIAGNOSIS — R55 Syncope and collapse: Secondary | ICD-10-CM | POA: Diagnosis not present

## 2019-09-01 LAB — CUP PACEART REMOTE DEVICE CHECK
Date Time Interrogation Session: 20200915081314
Implantable Pulse Generator Implant Date: 20190405

## 2019-09-10 ENCOUNTER — Ambulatory Visit: Payer: Medicare HMO | Admitting: Family Medicine

## 2019-09-11 NOTE — Progress Notes (Signed)
[facility] Summary Report / Loop Recorder 

## 2019-09-19 ENCOUNTER — Other Ambulatory Visit: Payer: Self-pay

## 2019-09-19 ENCOUNTER — Emergency Department: Payer: Medicare HMO

## 2019-09-19 ENCOUNTER — Encounter: Payer: Self-pay | Admitting: Emergency Medicine

## 2019-09-19 ENCOUNTER — Emergency Department
Admission: EM | Admit: 2019-09-19 | Discharge: 2019-09-19 | Disposition: A | Discharge status: Home / Self Care | Payer: Medicare HMO | Attending: Emergency Medicine | Admitting: Emergency Medicine

## 2019-09-19 DIAGNOSIS — R1010 Upper abdominal pain, unspecified: Secondary | ICD-10-CM | POA: Diagnosis not present

## 2019-09-19 DIAGNOSIS — Z5321 Procedure and treatment not carried out due to patient leaving prior to being seen by health care provider: Secondary | ICD-10-CM | POA: Diagnosis not present

## 2019-09-19 LAB — COMPREHENSIVE METABOLIC PANEL
ALT: 10 U/L (ref 0–44)
AST: 15 U/L (ref 15–41)
Albumin: 3.6 g/dL (ref 3.5–5.0)
Alkaline Phosphatase: 137 U/L — ABNORMAL HIGH (ref 38–126)
Anion gap: 7 (ref 5–15)
BUN: 27 mg/dL — ABNORMAL HIGH (ref 8–23)
CO2: 27 mmol/L (ref 22–32)
Calcium: 8.9 mg/dL (ref 8.9–10.3)
Chloride: 107 mmol/L (ref 98–111)
Creatinine, Ser: 1.58 mg/dL — ABNORMAL HIGH (ref 0.44–1.00)
GFR calc Af Amer: 39 mL/min — ABNORMAL LOW (ref >60)
GFR calc non Af Amer: 34 mL/min — ABNORMAL LOW (ref >60)
Glucose, Bld: 81 mg/dL (ref 70–99)
Potassium: 3.3 mmol/L — ABNORMAL LOW (ref 3.5–5.1)
Sodium: 141 mmol/L (ref 135–145)
Total Bilirubin: 0.5 mg/dL (ref 0.3–1.2)
Total Protein: 7.2 g/dL (ref 6.5–8.1)

## 2019-09-19 LAB — CBC
HCT: 35.6 % — ABNORMAL LOW (ref 36.0–46.0)
Hemoglobin: 11.2 g/dL — ABNORMAL LOW (ref 12.0–15.0)
MCH: 27.7 pg (ref 26.0–34.0)
MCHC: 31.5 g/dL (ref 30.0–36.0)
MCV: 88.1 fL (ref 80.0–100.0)
Platelets: 329 10*3/uL (ref 150–400)
RBC: 4.04 MIL/uL (ref 3.87–5.11)
RDW: 14 % (ref 11.5–15.5)
WBC: 7.8 10*3/uL (ref 4.0–10.5)
nRBC: 0 % (ref 0.0–0.2)

## 2019-09-19 LAB — TROPONIN I (HIGH SENSITIVITY): Troponin I (High Sensitivity): 4 ng/L (ref <18)

## 2019-09-19 LAB — LIPASE, BLOOD: Lipase: 39 U/L (ref 11–51)

## 2019-09-19 MED ORDER — SODIUM CHLORIDE 0.9% FLUSH: 3.0000 mL | Freq: Once | INTRAVENOUS | Status: DC

## 2019-09-19 NOTE — ED Triage Notes (Signed)
Upper abdominal pain x 2 weeks. Nausea.

## 2019-09-19 NOTE — ED Notes (Signed)
First Nurse Note: Pt to ED via POV c/o abd pain. Pt is in NAD at this time.

## 2019-09-19 NOTE — ED Notes (Signed)
Dawn from X-ray came out to call pt for x-ray. Pt not in lobby.

## 2019-09-19 NOTE — ED Notes (Signed)
Pt called to be taken to treatment room

## 2019-09-21 ENCOUNTER — Telehealth: Payer: Self-pay | Admitting: Emergency Medicine

## 2019-09-21 NOTE — Telephone Encounter (Signed)
Called patient due to lwot to inquire about condition and follow up plans. She says she continues to have pain upper abdomen, swelling abdomen and feels full very quickly after eating small amount food.  I told her that she really should be seen.  She agrees to call her pcp first and explain her symptoms and see if they can help her.  I told her that they may ask her to return here.

## 2019-09-23 ENCOUNTER — Other Ambulatory Visit: Payer: Self-pay

## 2019-09-23 ENCOUNTER — Encounter: Payer: Self-pay | Admitting: Family Medicine

## 2019-09-23 ENCOUNTER — Ambulatory Visit (INDEPENDENT_AMBULATORY_CARE_PROVIDER_SITE_OTHER): Payer: Medicare HMO | Admitting: Family Medicine

## 2019-09-23 VITALS — BP 100/58 | HR 76 | Temp 97.1°F | Resp 18 | Wt >= 6400 oz

## 2019-09-23 DIAGNOSIS — K56600 Partial intestinal obstruction, unspecified as to cause: Secondary | ICD-10-CM

## 2019-09-23 DIAGNOSIS — K56609 Unspecified intestinal obstruction, unspecified as to partial versus complete obstruction: Secondary | ICD-10-CM

## 2019-09-23 DIAGNOSIS — E1122 Type 2 diabetes mellitus with diabetic chronic kidney disease: Secondary | ICD-10-CM

## 2019-09-23 DIAGNOSIS — I10 Essential (primary) hypertension: Secondary | ICD-10-CM

## 2019-09-23 DIAGNOSIS — Z9884 Bariatric surgery status: Secondary | ICD-10-CM | POA: Diagnosis not present

## 2019-09-23 DIAGNOSIS — R1084 Generalized abdominal pain: Secondary | ICD-10-CM | POA: Diagnosis not present

## 2019-09-23 DIAGNOSIS — N182 Chronic kidney disease, stage 2 (mild): Secondary | ICD-10-CM | POA: Diagnosis not present

## 2019-09-23 NOTE — Progress Notes (Signed)
Patient: Julie Acevedo Female    DOB: November 28, 1951   68 y.o.   MRN: [id number] Visit Date: 09/23/2019  Today's Provider: [name], MD   Chief Complaint  Patient presents with  . Abdominal Pain   Subjective:     HPI  Patient states that she has knot in her abdomen that has gotten larger for the last 2 months. She is experiencing pain, nausea, and difficulty eating certain amounts of food. She does feel pain upon touching the area.   Allergies  Allergen Reactions  . Lotrel [Amlodipine Besy-Benazepril Hcl] Anaphylaxis  . Contrast Media [Iodinated Diagnostic Agents] Swelling and Other (See Comments)    Reaction:  Neck swelling   . Niacin Hives  . Sulfa Antibiotics Hives     Current Outpatient Medications:  .  Elastic Bandages & Supports (COMPRESSION & SUPPORT GLOVES L) MISC, , Disp: , Rfl:  .  folic acid (FOLVITE) 1 MG tablet, Take 1 tablet (1 mg total) by mouth daily., Disp: 30 tablet, Rfl: 0 .  gabapentin (NEURONTIN) 100 MG capsule, TAKE 2 CAPSULES (200 MG TOTAL) BY MOUTH 3 (THREE) TIMES DAILY., Disp: 540 capsule, Rfl: 1 .  hydrochlorothiazide (HYDRODIURIL) 25 MG tablet, TAKE 1 TABLET (25 MG TOTAL) BY MOUTH DAILY., Disp: 90 tablet, Rfl: 3 .  LINZESS 290 MCG CAPS capsule, TAKE 1 CAPSULE (290 MCG TOTAL) BY MOUTH DAILY BEFORE BREAKFAST. (Patient taking differently: Take 290 mcg by mouth daily before breakfast. ), Disp: 90 capsule, Rfl: 3 .  meloxicam (MOBIC) 15 MG tablet, Take 15 mg by mouth daily. , Disp: , Rfl:  .  metFORMIN (GLUCOPHAGE) 500 MG tablet, Take 1 tablet (500 mg total) by mouth daily with breakfast., Disp: 90 tablet, Rfl: 0 .  methocarbamol (ROBAXIN) 500 MG tablet, Take 500 mg by mouth 3 (three) times daily., Disp: , Rfl:  .  metoprolol succinate (TOPROL-XL) 25 MG 24 hr tablet, Take 0.5 tablets (12.5 mg total) by mouth daily., Disp: , Rfl:  .  ondansetron (ZOFRAN ODT) 4 MG disintegrating tablet, Take 1 tablet (4 mg total) by mouth every 8 (eight) hours as  needed for nausea or vomiting., Disp: 20 tablet, Rfl: 0 .  traMADol (ULTRAM) 50 MG tablet, Take 50 mg by mouth as needed. , Disp: , Rfl:  .  traZODone (DESYREL) 50 MG tablet, TAKE 1 TABLET (50 MG TOTAL) BY MOUTH AT BEDTIME AS NEEDED FOR SLEEP., Disp: 90 tablet, Rfl: 3 .  Vitamin D, Ergocalciferol, (DRISDOL) 1.25 MG (50000 UT) CAPS capsule, Take 1 capsule (50,000 Units total) by mouth every 7 (seven) days., Disp: 12 capsule, Rfl: 1  Review of Systems  Constitutional: Negative.   HENT: Negative.   Eyes: Negative.   Respiratory: Negative.   Cardiovascular: Negative.   Gastrointestinal: Positive for abdominal distention, abdominal pain and diarrhea.  Endocrine: Negative.   Genitourinary: Negative.   Allergic/Immunologic: Negative.   Neurological: Negative.   Psychiatric/Behavioral: Negative.     Social History   Tobacco Use  . Smoking status: Never Smoker  . Smokeless tobacco: Never Used  Substance Use Topics  . Alcohol use: No      Objective:   BP (!) 100/58 (BP Location: Left Arm, Patient Position: Sitting, Cuff Size: Normal)   Pulse 76   Temp (!) 97.1 F (36.2 C) (Temporal)   Resp 18   Wt (!) 1458 lb 12.8 oz (661.7 kg)   SpO2 99%   BMI 258.41 kg/m  Vitals:   09/23/19 1115  BP: (!) 100/58  Pulse: 76  Resp: 18  Temp: (!) 97.1 F (36.2 C)  TempSrc: Temporal  SpO2: 99%  Weight: (!) 1458 lb 12.8 oz (661.7 kg)  Body mass index is 258.41 kg/m.   Physical Exam Vitals signs reviewed.  Constitutional:      Appearance: She is well-developed.  HENT:     Head: Normocephalic and atraumatic.     Right Ear: External ear normal.     Left Ear: External ear normal.     Nose: Nose normal.  Eyes:     Conjunctiva/sclera: Conjunctivae normal.  Neck:     Thyroid: No thyromegaly.  Cardiovascular:     Rate and Rhythm: Normal rate and regular rhythm.     Heart sounds: Normal heart sounds.  Pulmonary:     Effort: Pulmonary effort is normal.     Breath sounds: Normal breath  sounds.  Abdominal:     General: Bowel sounds are normal.     Palpations: Abdomen is soft.     Tenderness: There is generalized abdominal tenderness and tenderness in the right upper quadrant, right lower quadrant and periumbilical area. Negative signs include Murphy's sign and McBurney's sign.  Genitourinary:    Uterus: Enlarged.   Skin:    General: Skin is warm and dry.  Neurological:     Mental Status: She is alert and oriented to person, place, and time.  Psychiatric:        Mood and Affect: Mood normal.        Behavior: Behavior normal.        Thought Content: Thought content normal.        Judgment: Judgment normal.      No results found for any visits on 09/23/19.     Assessment & Plan    1. Generalized abdominal pain In patient with multiple surgical procedures in the past.  Bowel sounds are normal but I am concerned about obstruction.  Obtain KUB and refer back to surgery.  Lab work also obtained.  CBC, MetC lipase. - CBC w/Diff/Platelet - Lipase - Comp. Metabolic Panel (12) - Urinalysis - Ambulatory referral to General Surgery - HYDROcodone-acetaminophen (NORCO) 5-325 MG tablet; Take 1 tablet by mouth every 4 (four) hours as needed for moderate pain.  Dispense: 25 tablet; Refill: 0 - DG Abd 1 View; Future  2. Gastric bypass status for obesity History of  3. SBO (small bowel obstruction) ([place]) History of  4. Partial small bowel obstruction ([facility]) History of  5. Large bowel obstruction (HCC) History of  6. Benign essential HTN   7. Type 2 diabetes mellitus with stage 2 chronic kidney disease, without long-term current use of insulin ([place])      [name], MD  [facility]

## 2019-09-26 MED ORDER — HYDROCODONE-ACETAMINOPHEN 5-325 MG PO TABS: 1.0000 | ORAL_TABLET | ORAL | 0 refills | Status: DC | PRN

## 2019-09-28 ENCOUNTER — Other Ambulatory Visit: Payer: Self-pay

## 2019-09-28 ENCOUNTER — Ambulatory Visit: Payer: Medicare HMO | Admitting: Surgery

## 2019-09-28 DIAGNOSIS — R1084 Generalized abdominal pain: Secondary | ICD-10-CM | POA: Diagnosis not present

## 2019-09-29 LAB — CBC WITH DIFFERENTIAL/PLATELET
Basophils Absolute: 0 10*3/uL (ref 0.0–0.2)
Basos: 0 %
EOS (ABSOLUTE): 0.2 10*3/uL (ref 0.0–0.4)
Eos: 2 %
Hematocrit: 34.1 % (ref 34.0–46.6)
Hemoglobin: 11.3 g/dL (ref 11.1–15.9)
Immature Grans (Abs): 0 10*3/uL (ref 0.0–0.1)
Immature Granulocytes: 0 %
Lymphocytes Absolute: 2.4 10*3/uL (ref 0.7–3.1)
Lymphs: 26 %
MCH: 28.3 pg (ref 26.6–33.0)
MCHC: 33.1 g/dL (ref 31.5–35.7)
MCV: 85 fL (ref 79–97)
Monocytes Absolute: 0.6 10*3/uL (ref 0.1–0.9)
Monocytes: 6 %
Neutrophils Absolute: 6.2 10*3/uL (ref 1.4–7.0)
Neutrophils: 66 %
Platelets: 357 10*3/uL (ref 150–450)
RBC: 4 x10E6/uL (ref 3.77–5.28)
RDW: 13.5 % (ref 11.7–15.4)
WBC: 9.5 10*3/uL (ref 3.4–10.8)

## 2019-09-29 LAB — COMP. METABOLIC PANEL (12)
AST: 16 IU/L (ref 0–40)
Albumin/Globulin Ratio: 1.3 (ref 1.2–2.2)
Albumin: 3.9 g/dL (ref 3.8–4.8)
Alkaline Phosphatase: 170 IU/L — ABNORMAL HIGH (ref 39–117)
BUN/Creatinine Ratio: 11 — ABNORMAL LOW (ref 12–28)
BUN: 17 mg/dL (ref 8–27)
Bilirubin Total: <0.2 mg/dL (ref 0.0–1.2)
Calcium: 9.3 mg/dL (ref 8.7–10.3)
Chloride: 108 mmol/L — ABNORMAL HIGH (ref 96–106)
Creatinine, Ser: 1.61 mg/dL — ABNORMAL HIGH (ref 0.57–1.00)
GFR calc Af Amer: 38 mL/min/{1.73_m2} — ABNORMAL LOW (ref >59)
GFR calc non Af Amer: 33 mL/min/{1.73_m2} — ABNORMAL LOW (ref >59)
Globulin, Total: 3 g/dL (ref 1.5–4.5)
Glucose: 69 mg/dL (ref 65–99)
Potassium: 4.3 mmol/L (ref 3.5–5.2)
Sodium: 146 mmol/L — ABNORMAL HIGH (ref 134–144)
Total Protein: 6.9 g/dL (ref 6.0–8.5)

## 2019-09-29 LAB — URINALYSIS
Bilirubin, UA: NEGATIVE
Glucose, UA: NEGATIVE
Nitrite, UA: NEGATIVE
RBC, UA: NEGATIVE
Specific Gravity, UA: 1.028 (ref 1.005–1.030)
Urobilinogen, Ur: 0.2 mg/dL (ref 0.2–1.0)
pH, UA: 5 (ref 5.0–7.5)

## 2019-09-29 LAB — LIPASE: Lipase: 39 U/L (ref 14–72)

## 2019-09-30 ENCOUNTER — Ambulatory Visit (INDEPENDENT_AMBULATORY_CARE_PROVIDER_SITE_OTHER): Payer: Medicare HMO | Admitting: Surgery

## 2019-09-30 ENCOUNTER — Other Ambulatory Visit: Payer: Self-pay

## 2019-09-30 ENCOUNTER — Encounter: Payer: Self-pay | Admitting: Surgery

## 2019-09-30 DIAGNOSIS — R1084 Generalized abdominal pain: Secondary | ICD-10-CM | POA: Diagnosis not present

## 2019-09-30 MED ORDER — DIPHENHYDRAMINE HCL 50 MG PO TABS: 50.0000 mg | ORAL_TABLET | Freq: Once | ORAL | 0 refills | Status: DC

## 2019-09-30 NOTE — Patient Instructions (Addendum)
You have been scheduled for a CT of the abdomen and pelvis at the [facility] on 10/08/19 at 8:30 am. You will need to arrive there by 8:15 am and have nothing to eat or drink for 4 hours prior. You will need to pick up a prep kit prior to this. You will need to take your Benadryl 50 mg one hour prior to your scan.   Follow up here in 3 weeks with Dr Dahlia Byes.

## 2019-10-01 ENCOUNTER — Encounter: Payer: Self-pay | Admitting: Surgery

## 2019-10-01 NOTE — Progress Notes (Signed)
Patient ID: Julie Acevedo, female   DOB: [birth year], 68 y.o.   MRN: [id number]  HPI Julie Acevedo is a 68 y.o. female 68 year old very nice female with multiple abdominal operations to include hysterectomy, gastric bypass laparoscopically, laparoscopic ventral hernia repair and more recently exploratory laparotomy for small bowel obstruction 04/2019 by Dr. Lysle Pearl. She now comes in with a new bulge within the abdominal wall that is causing significant pain.  The pain is intermittent moderate in intensity located to the mid of the abdomen.  The pain seems to be worsening with Valsalva.  No fevers no chills.  No nausea no vomiting. She wants to stay with her group. She does have diabetes and hypertension. I did review her last CT from more than 3 months ago showing no acute intra-abdominal pathology, with some postsurgical changes to the midline of the abdominal wall.  I do think I see a small hernia defect CBC only revealed evidence of a hemoglobin of 11 and CMP was normal other than mild increase in the creatinine. 1.57 baseline. SHe is able to perform more than 4 METS of activity without any shortness of breath or chest pain. Marland Kitchen HPI  Past Medical History:  Diagnosis Date  . Arthritis   . Diverticulitis   . DM (diabetes mellitus) ([place])    typ e 2  . HTN (hypertension)   . Hyperlipidemia   . Memory difficulties 09/01/2015  . Short-term memory loss     Past Surgical History:  Procedure Laterality Date  . ABDOMINAL HYSTERECTOMY     due to endometriosis-1 ovary left  . BREAST EXCISIONAL BIOPSY Right 1971   neg  . CARDIAC CATHETERIZATION  2000   no significant CAD per note of Dr Caryl Comes in 2013   . CHOLECYSTECTOMY    . CYSTOCELE REPAIR N/A 03/19/2017   Procedure: ANTERIOR REPAIR (CYSTOCELE);  Surgeon: Bjorn Loser, MD;  Location: [facility];  Service: Urology;  Laterality: N/A;  . CYSTOSCOPY N/A 03/19/2017   Procedure: CYSTOSCOPY;  Surgeon: Bjorn Loser, MD;  Location: [facility];  Service:  Urology;  Laterality: N/A;  . EYE SURGERY    . FOOT SURGERY    . HAND SURGERY    . HEMICOLECTOMY    . INSERTION OF MESH N/A 08/21/2016   Procedure: INSERTION OF MESH;  Surgeon: Excell Seltzer, MD;  Location: [facility];  Service: General;  Laterality: N/A;  . LAPAROSCOPIC LYSIS OF ADHESIONS N/A 08/21/2016   Procedure: LAPAROSCOPIC LYSIS OF ADHESIONS;  Surgeon: Excell Seltzer, MD;  Location: [facility];  Service: General;  Laterality: N/A;  . LAPAROSCOPIC REMOVAL ABDOMINAL MASS    . LAPAROTOMY N/A 04/16/2019   Procedure: EXPLORATORY LAPAROTOMY;  Surgeon: Benjamine Sprague, DO;  Location: [facility];  Service: General;  Laterality: N/A;  . LOOP RECORDER INSERTION N/A 03/21/2018   Procedure: LOOP RECORDER INSERTION;  Surgeon: Sanda Klein, MD;  Location: [facility];  Service: Cardiovascular;  Laterality: N/A;  . LUMBAR [place]    . ROUX-EN-Y GASTRIC BYPASS  2010   Dr Hassell Done  . TONSILLECTOMY    . UPPER GI ENDOSCOPY  01/12/16   normal larynx, normal esophagus. gastric bypass with a normal-sized pouch and intact staple line, normal examined jejunum, otherwise exam was normal  . VENTRAL HERNIA REPAIR N/A 08/21/2016   Procedure: LAPAROSCOPIC VENTRAL HERNIA;  Surgeon: Excell Seltzer, MD;  Location: [facility];  Service: General;  Laterality: N/A;    Family History  Problem Relation Age of Onset  . Cancer Father 62  Lung  . Coronary artery disease Father 51  . COPD Mother   . Diabetes Mother   . Hypertension Mother   . Cancer Paternal Grandmother        Breast  . Breast cancer Paternal Grandmother   . Congestive Heart Failure Maternal Grandmother   . Emphysema Maternal Grandfather   . Heart attack Paternal Grandfather   . Cancer Paternal Aunt        Breast  . Breast cancer Paternal Aunt 48  . Breast cancer Paternal Aunt 19    Social History Social History   Tobacco Use  . Smoking status: Never Smoker  . Smokeless tobacco: Never Used  Substance Use Topics  . Alcohol use: No   . Drug use: No    Allergies  Allergen Reactions  . Lotrel [Amlodipine Besy-Benazepril Hcl] Anaphylaxis  . Contrast Media [Iodinated Diagnostic Agents] Swelling and Other (See Comments)    Reaction:  Neck swelling   . Niacin Hives  . Sulfa Antibiotics Hives    Current Outpatient Medications  Medication Sig Dispense Refill  . Elastic Bandages & Supports (COMPRESSION & SUPPORT GLOVES L) MISC     . folic acid (FOLVITE) 1 MG tablet Take 1 tablet (1 mg total) by mouth daily. 30 tablet 0  . gabapentin (NEURONTIN) 100 MG capsule TAKE 2 CAPSULES (200 MG TOTAL) BY MOUTH 3 (THREE) TIMES DAILY. 540 capsule 1  . hydrochlorothiazide (HYDRODIURIL) 25 MG tablet TAKE 1 TABLET (25 MG TOTAL) BY MOUTH DAILY. 90 tablet 3  . HYDROcodone-acetaminophen (NORCO) 5-325 MG tablet Take 1 tablet by mouth every 4 (four) hours as needed for moderate pain. 25 tablet 0  . LINZESS 290 MCG CAPS capsule TAKE 1 CAPSULE (290 MCG TOTAL) BY MOUTH DAILY BEFORE BREAKFAST. (Patient taking differently: Take 290 mcg by mouth daily before breakfast. ) 90 capsule 3  . meloxicam (MOBIC) 15 MG tablet Take 15 mg by mouth daily.     . metFORMIN (GLUCOPHAGE) 500 MG tablet Take 1 tablet (500 mg total) by mouth daily with breakfast. 90 tablet 0  . methocarbamol (ROBAXIN) 500 MG tablet Take 500 mg by mouth 3 (three) times daily.    . metoprolol succinate (TOPROL-XL) 25 MG 24 hr tablet Take 0.5 tablets (12.5 mg total) by mouth daily.    . ondansetron (ZOFRAN ODT) 4 MG disintegrating tablet Take 1 tablet (4 mg total) by mouth every 8 (eight) hours as needed for nausea or vomiting. 20 tablet 0  . traMADol (ULTRAM) 50 MG tablet Take 50 mg by mouth as needed.     . traZODone (DESYREL) 50 MG tablet TAKE 1 TABLET (50 MG TOTAL) BY MOUTH AT BEDTIME AS NEEDED FOR SLEEP. 90 tablet 3  . Vitamin D, Ergocalciferol, (DRISDOL) 1.25 MG (50000 UT) CAPS capsule Take 1 capsule (50,000 Units total) by mouth every 7 (seven) days. 12 capsule 1  . diphenhydrAMINE  (BENADRYL) 50 MG tablet Take 1 tablet (50 mg total) by mouth once for 1 dose. Take 1 hour prior to your CT study 1 tablet 0   No current facility-administered medications for this visit.      Review of Systems Full ROS  was asked and was negative except for the information on the HPI  Physical Exam Blood pressure 122/84, pulse 74, temperature 97.7 F (36.5 C), height 5\' 3"  (1.6 m), weight 145 lb 12.8 oz (66.1 kg), SpO2 97 %. CONSTITUTIONAL: NAD EYES: Pupils are equal, round, and reactive to light, Sclera are non-icteric. EARS, NOSE, MOUTH AND THROAT:  The oropharynx is clear. The oral mucosa is pink and moist. Hearing is intact to voice. LYMPH NODES:  Lymph nodes in the neck are normal. RESPIRATORY:  Lungs are clear. There is normal respiratory effort, with equal breath sounds bilaterally, and without pathologic use of accessory muscles. CARDIOVASCULAR: Heart is regular without murmurs, gallops, or rubs. GI: The abdomen is  soft, There are normal bowel sounds in all quadrants.  There is midline ventral hernia measuring approximately 4 cm.  It is tender to palpation.  No peritonitis no evidence of strangulation or incarceration.  Previous midline laparotomy scar GU: Rectal deferred.   MUSCULOSKELETAL: Normal muscle strength and tone. No cyanosis or edema.   SKIN: Turgor is good and there are no pathologic skin lesions or ulcers. NEUROLOGIC: Motor and sensation is grossly normal. Cranial nerves are grossly intact. PSYCH:  Oriented to person, place and time. Affect is normal.  Data Reviewed I have personally reviewed the patient's imaging, laboratory findings and medical records.    Assessment/Plan 68 year old very nice female with multiple abdominal operations to include GYN procedures, gastric bypass laparoscopically, laparoscopic ventral hernia repair and more recently exploratory laparotomy for small bowel obstruction.  sHe now presents with a new symptomatic ventral hernia.  Given all  the previous surgical history I will obtain a CT scan of the abdomen and pelvis mainly for preoperative planning.  I do think that she might be a good candidate for robotic ventral hernia repair but I would like to first assess her anatomy and then determine whether or not this is a feasible options. There is no need for emergent surgical intervention at this time       Caroleen Hamman, MD [facility] 10/01/2019, 1:10 PM

## 2019-10-02 ENCOUNTER — Other Ambulatory Visit: Payer: Self-pay | Admitting: Family Medicine

## 2019-10-02 DIAGNOSIS — Z1231 Encounter for screening mammogram for malignant neoplasm of breast: Secondary | ICD-10-CM

## 2019-10-05 ENCOUNTER — Ambulatory Visit (INDEPENDENT_AMBULATORY_CARE_PROVIDER_SITE_OTHER): Payer: Medicare HMO | Admitting: *Deleted

## 2019-10-05 ENCOUNTER — Other Ambulatory Visit: Payer: Self-pay | Admitting: Family Medicine

## 2019-10-05 DIAGNOSIS — R55 Syncope and collapse: Secondary | ICD-10-CM | POA: Diagnosis not present

## 2019-10-05 DIAGNOSIS — I10 Essential (primary) hypertension: Secondary | ICD-10-CM

## 2019-10-05 LAB — CUP PACEART REMOTE DEVICE CHECK
Date Time Interrogation Session: 20201018104429
Implantable Pulse Generator Implant Date: 20190405

## 2019-10-08 ENCOUNTER — Other Ambulatory Visit: Payer: Self-pay

## 2019-10-08 ENCOUNTER — Ambulatory Visit
Admission: RE | Admit: 2019-10-08 | Discharge: 2019-10-08 | Disposition: A | Discharge status: Home / Self Care | Payer: Medicare HMO | Source: Ambulatory Visit | Attending: Surgery | Admitting: Surgery

## 2019-10-08 ENCOUNTER — Telehealth: Payer: Self-pay

## 2019-10-08 DIAGNOSIS — R1084 Generalized abdominal pain: Secondary | ICD-10-CM | POA: Insufficient documentation

## 2019-10-08 DIAGNOSIS — K439 Ventral hernia without obstruction or gangrene: Secondary | ICD-10-CM | POA: Diagnosis not present

## 2019-10-08 NOTE — Telephone Encounter (Signed)
I let the pt know it is no need to send manual transmissions unless we ask for one. The pt verbalized understanding.

## 2019-10-19 ENCOUNTER — Ambulatory Visit: Payer: Medicare HMO | Admitting: Surgery

## 2019-10-19 ENCOUNTER — Encounter: Payer: Self-pay | Admitting: Surgery

## 2019-10-19 ENCOUNTER — Other Ambulatory Visit: Payer: Self-pay

## 2019-10-19 VITALS — BP 112/73 | HR 54 | Temp 97.5°F | Resp 12 | Ht 63.0 in | Wt 142.4 lb

## 2019-10-19 DIAGNOSIS — K432 Incisional hernia without obstruction or gangrene: Secondary | ICD-10-CM

## 2019-10-19 NOTE — Patient Instructions (Signed)
Our surgery scheduler will call you within 24/48 hours to schedule your surgery.  Please have the [facility] surgery sheet available when speaking with her.    Ventral Hernia  A ventral hernia is a bulge of tissue from inside the abdomen that pushes through a weak area of the muscles that form the front wall of the abdomen. The tissues inside the abdomen are inside a sac (peritoneum). These tissues include the small intestine, large intestine, and the fatty tissue that covers the intestines (omentum). Sometimes, the bulge that forms a hernia contains intestines. Other hernias contain only fat. Ventral hernias do not go away without surgical treatment. There are several types of ventral hernias. You may have:  A hernia at an incision site from previous abdominal surgery (incisional hernia).  A hernia just above the belly button (epigastric hernia), or at the belly button (umbilical hernia). These types of hernias can develop from heavy lifting or straining.  A hernia that comes and goes (reducible hernia). It may be visible only when you lift or strain. This type of hernia can be pushed back into the abdomen (reduced).  A hernia that traps abdominal tissue inside the hernia (incarcerated hernia). This type of hernia does not reduce.  A hernia that cuts off blood flow to the tissues inside the hernia (strangulated hernia). The tissues can start to die if this happens. This is a very painful bulge that cannot be reduced. A strangulated hernia is a medical emergency. What are the causes? This condition is caused by abdominal tissue putting pressure on an area of weakness in the abdominal muscles. What increases the risk? The following factors may make you more likely to develop this condition:  Being female.  Being 68 or older.  Being overweight or obese.  Having had previous abdominal surgery, especially if there was an infection after surgery.  Having had an injury to the abdominal wall.   Having had several pregnancies.  Having a buildup of fluid inside the abdomen (ascites). What are the signs or symptoms? The only symptom of a ventral hernia may be a painless bulge in the abdomen. A reducible hernia may be visible only when you strain, cough, or lift. Other symptoms may include:  Dull pain.  A feeling of pressure. Signs and symptoms of a strangulated hernia may include:  Increasing pain.  Nausea and vomiting.  Pain when pressing on the hernia.  The skin over the hernia turning red or purple.  Constipation.  Blood in the stool (feces). How is this diagnosed? This condition may be diagnosed based on:  Your symptoms.  Your medical history.  A physical exam. You may be asked to cough or strain while standing. These actions increase the pressure inside your abdomen and force the hernia through the opening in your muscles. Your health care provider may try to reduce the hernia by pressing on it.  Imaging studies, such as an ultrasound or CT scan. How is this treated? This condition is treated with surgery. If you have a strangulated hernia, surgery is done as soon as possible. If your hernia is small and not incarcerated, you may be asked to lose some weight before surgery. Follow these instructions at home:  Follow instructions from your health care provider about eating or drinking restrictions.  If you are overweight, your health care provider may recommend that you increase your activity level and eat a healthier diet.  Do not lift anything that is heavier than 10 lb (4.5 kg).  Return  to your normal activities as told by your health care provider. Ask your health care provider what activities are safe for you. You may need to avoid activities that increase pressure on your hernia.  Take over-the-counter and prescription medicines only as told by your health care provider.  Keep all follow-up visits as told by your health care provider. This is important.  Contact a health care provider if:  Your hernia gets larger.  Your hernia becomes painful. Get help right away if:  Your hernia becomes increasingly painful.  You have pain along with any of the following: ? Changes in skin color in the area of the hernia. ? Nausea. ? Vomiting. ? Fever. Summary  A ventral hernia is a bulge of tissue from inside the abdomen that pushes through a weak area of the muscles that form the front wall of the abdomen.  This condition is treated with surgery, which may be urgent depending on your hernia.  Do not lift anything that is heavier than 10 lb (4.5 kg), and follow activity instructions from your health care provider. This information is not intended to replace advice given to you by your health care provider. Make sure you discuss any questions you have with your health care provider. Document Released: 11/19/2012 Document Revised: 01/15/2018 Document Reviewed: 06/24/2017 Elsevier Patient Education  2020 [employer].

## 2019-10-21 ENCOUNTER — Encounter: Payer: Self-pay | Admitting: Surgery

## 2019-10-21 NOTE — Progress Notes (Signed)
Outpatient Surgical Follow Up  10/21/2019  Julie Acevedo is an 68 y.o. female.   Chief Complaint  Patient presents with  . Follow-up    abd pain f/u CT scan    HPI: Julie Acevedo is a 68 year old female known to me with history of multiple abdominal operations including laparoscopic gastric bypass hysterectomy and ventral hernia repair.  She did have an exploratory laparotomy few months ago and now comes in with a new ventral hernia.  I did order a CT scan that I have personally reviewed showing evidence of a ventral hernia that is recurrent.  No other intra-abdominal pathology.  There is evidence of a small bowel within the hernia sac.  She continues to have some intermittent pain that is mild to moderate intensity and dull in nature. Most current labs show a creatinine baseline of 1.6 with normal electrolytes.  CBC showed a hemoglobin of 11.3 with the rest of the labs normal are normal  Past Medical History:  Diagnosis Date  . Arthritis   . Diverticulitis   . DM (diabetes mellitus) ([place])    typ e 2  . HTN (hypertension)   . Hyperlipidemia   . Memory difficulties 09/01/2015  . Short-term memory loss     Past Surgical History:  Procedure Laterality Date  . ABDOMINAL HYSTERECTOMY     due to endometriosis-1 ovary left  . BREAST EXCISIONAL BIOPSY Right 1971   neg  . CARDIAC CATHETERIZATION  2000   no significant CAD per note of Dr Caryl Comes in 2013   . CHOLECYSTECTOMY    . CYSTOCELE REPAIR N/A 03/19/2017   Procedure: ANTERIOR REPAIR (CYSTOCELE);  Surgeon: Bjorn Loser, MD;  Location: [facility];  Service: Urology;  Laterality: N/A;  . CYSTOSCOPY N/A 03/19/2017   Procedure: CYSTOSCOPY;  Surgeon: Bjorn Loser, MD;  Location: [facility];  Service: Urology;  Laterality: N/A;  . EYE SURGERY    . FOOT SURGERY    . HAND SURGERY    . HEMICOLECTOMY    . INSERTION OF MESH N/A 08/21/2016   Procedure: INSERTION OF MESH;  Surgeon: Excell Seltzer, MD;  Location: [facility];  Service: General;  Laterality: N/A;   . LAPAROSCOPIC LYSIS OF ADHESIONS N/A 08/21/2016   Procedure: LAPAROSCOPIC LYSIS OF ADHESIONS;  Surgeon: Excell Seltzer, MD;  Location: [facility];  Service: General;  Laterality: N/A;  . LAPAROSCOPIC REMOVAL ABDOMINAL MASS    . LAPAROTOMY N/A 04/16/2019   Procedure: EXPLORATORY LAPAROTOMY;  Surgeon: Benjamine Sprague, DO;  Location: [facility];  Service: General;  Laterality: N/A;  . LOOP RECORDER INSERTION N/A 03/21/2018   Procedure: LOOP RECORDER INSERTION;  Surgeon: Sanda Klein, MD;  Location: [facility];  Service: Cardiovascular;  Laterality: N/A;  . LUMBAR [place]    . ROUX-EN-Y GASTRIC BYPASS  2010   Dr Hassell Done  . TONSILLECTOMY    . UPPER GI ENDOSCOPY  01/12/16   normal larynx, normal esophagus. gastric bypass with a normal-sized pouch and intact staple line, normal examined jejunum, otherwise exam was normal  . VENTRAL HERNIA REPAIR N/A 08/21/2016   Procedure: LAPAROSCOPIC VENTRAL HERNIA;  Surgeon: Excell Seltzer, MD;  Location: [facility];  Service: General;  Laterality: N/A;    Family History  Problem Relation Age of Onset  . Cancer Father 83       Lung  . Coronary artery disease Father 34  . COPD Mother   . Diabetes Mother   . Hypertension Mother   . Cancer Paternal Grandmother  Breast  . Breast cancer Paternal Grandmother   . Congestive Heart Failure Maternal Grandmother   . Emphysema Maternal Grandfather   . Heart attack Paternal Grandfather   . Cancer Paternal Aunt        Breast  . Breast cancer Paternal Aunt 64  . Breast cancer Paternal Aunt 98    Social History:  reports that she has never smoked. She has never used smokeless tobacco. She reports that she does not drink alcohol or use drugs.  Allergies:  Allergies  Allergen Reactions  . Lotrel [Amlodipine Besy-Benazepril Hcl] Anaphylaxis  . Contrast Media [Iodinated Diagnostic Agents] Swelling and Other (See Comments)    Reaction:  Neck swelling   . Niacin Hives  . Sulfa Antibiotics Hives     Medications reviewed.    ROS Full ROS performed and is otherwise negative other than what is stated in HPI   BP 112/73   Pulse (!) 54   Temp (!) 97.5 F (36.4 C) (Temporal)   Resp 12   Ht 5\' 3"  (1.6 m)   Wt 142 lb 6.4 oz (64.6 kg)   SpO2 98%   BMI 25.23 kg/m   Physical Exam   CONSTITUTIONAL: NAD  LYMPH NODES:  Lymph nodes in the neck are normal. RESPIRATORY:  Lungs are clear. There is normal respiratory effort, with equal breath sounds bilaterally, and without pathologic use of accessory muscles. CARDIOVASCULAR: Heart is regular without murmurs, gallops, or rubs. GI: The abdomen is  soft, There are normal bowel sounds in all quadrants.  There is midline ventral hernia measuring approximately 4 cm.  It is tender to palpation.  No peritonitis no evidence of strangulation or incarceration.  Previous midline laparotomy scar GU: Rectal deferred.   MUSCULOSKELETAL: Normal muscle strength and tone. No cyanosis or edema.   SKIN: Turgor is good and there are no pathologic skin lesions or ulcers. NEUROLOGIC: Motor and sensation is grossly normal. Cranial nerves are grossly intact. PSYCH:  Oriented to person, place and time. Affect is normal.    Assessment/Plan: 68 year old female with symptomatic ventral hernia.  Discussed with the patient in detail and I do think that she would be a good candidate for robotic approach.  Procedure discussed with the patient in detail.  Risk, benefits and possible complications including but not limited to: Bleeding, infection, bowel injury, recurrence is, chronic pain and mesh issues.  She understands and wishes to proceed. Greater than 50% of the 40 minutes  visit was spent in counseling/coordination of care   Caroleen Hamman, MD [facility]

## 2019-10-22 ENCOUNTER — Telehealth: Payer: Self-pay | Admitting: Surgery

## 2019-10-22 NOTE — Telephone Encounter (Signed)
Pt has been advised of pre admission date/time, Covid Testing date and Surgery date.  Surgery Date: 11/19/19 with Dr Kris Mouton assisted ventral hernia repair.  Preadmission Testing Date: 11/16/19 @ 8:00am-Patient advised to arrive at the medical mall at [facility]. Covid Testing Date: 11/16/19 following preadmission testing appointment - patient advised to go to the [place] ([place])  [employer] Video sent via [employer] [facility] and [employer].  Patient has been made aware to call [phone], between 1-3:00pm the day before surgery, to find out what time to arrive.

## 2019-10-23 NOTE — Progress Notes (Signed)
[facility] Summary Report / Loop Recorder 

## 2019-11-03 DIAGNOSIS — R0602 Shortness of breath: Secondary | ICD-10-CM | POA: Diagnosis not present

## 2019-11-03 DIAGNOSIS — D649 Anemia, unspecified: Secondary | ICD-10-CM | POA: Diagnosis not present

## 2019-11-03 DIAGNOSIS — I10 Essential (primary) hypertension: Secondary | ICD-10-CM | POA: Diagnosis not present

## 2019-11-03 DIAGNOSIS — I208 Other forms of angina pectoris: Secondary | ICD-10-CM | POA: Diagnosis not present

## 2019-11-03 DIAGNOSIS — R079 Chest pain, unspecified: Secondary | ICD-10-CM | POA: Diagnosis not present

## 2019-11-03 DIAGNOSIS — E119 Type 2 diabetes mellitus without complications: Secondary | ICD-10-CM | POA: Diagnosis not present

## 2019-11-03 DIAGNOSIS — E6609 Other obesity due to excess calories: Secondary | ICD-10-CM | POA: Diagnosis not present

## 2019-11-06 ENCOUNTER — Ambulatory Visit (INDEPENDENT_AMBULATORY_CARE_PROVIDER_SITE_OTHER): Payer: Medicare HMO | Admitting: *Deleted

## 2019-11-06 DIAGNOSIS — R55 Syncope and collapse: Secondary | ICD-10-CM | POA: Diagnosis not present

## 2019-11-06 LAB — CUP PACEART REMOTE DEVICE CHECK
Date Time Interrogation Session: 20201120092454
Implantable Pulse Generator Implant Date: 20190405

## 2019-11-09 IMAGING — MG 2D DIGITAL DIAGNOSTIC BILATERAL MAMMOGRAM WITH CAD AND ADJUNCT T
9 of 14 series · 9 of 30 positions shown · non-contrast
Comparison: Previous exam(s).

CLINICAL DATA: 66-year-old female presenting for follow-up of
probably benign right breast calcifications.

EXAM:
2D DIGITAL DIAGNOSTIC BILATERAL MAMMOGRAM WITH CAD AND ADJUNCT TOMO

[R CC (1 of 2)]
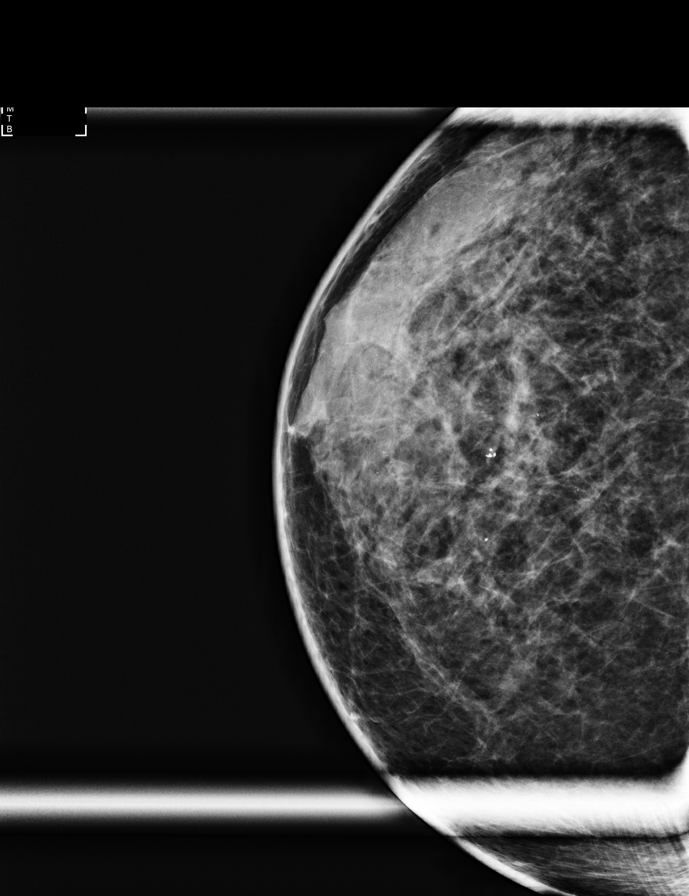

[R ML]
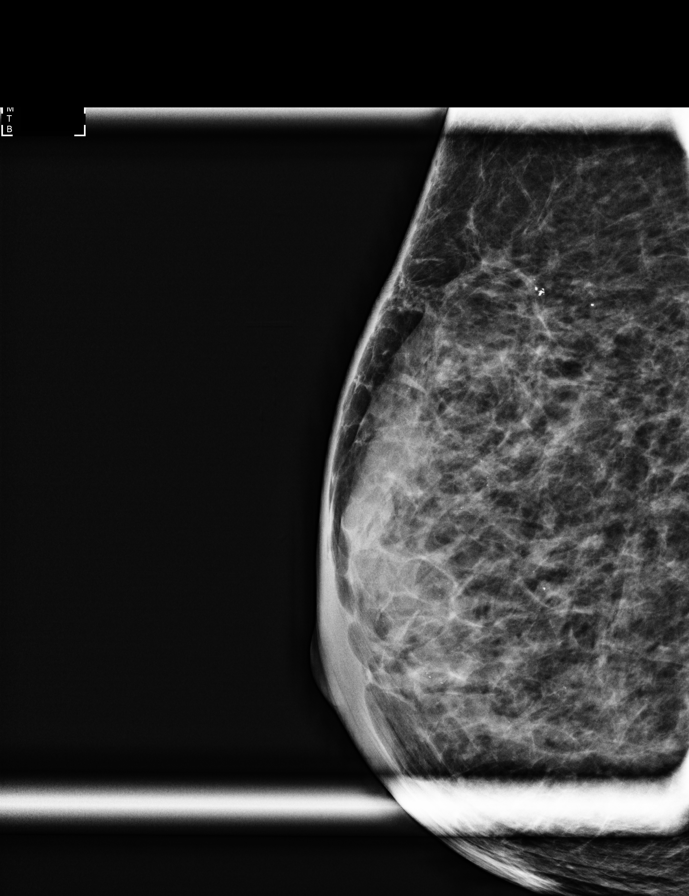

[R CC (2 of 2)]
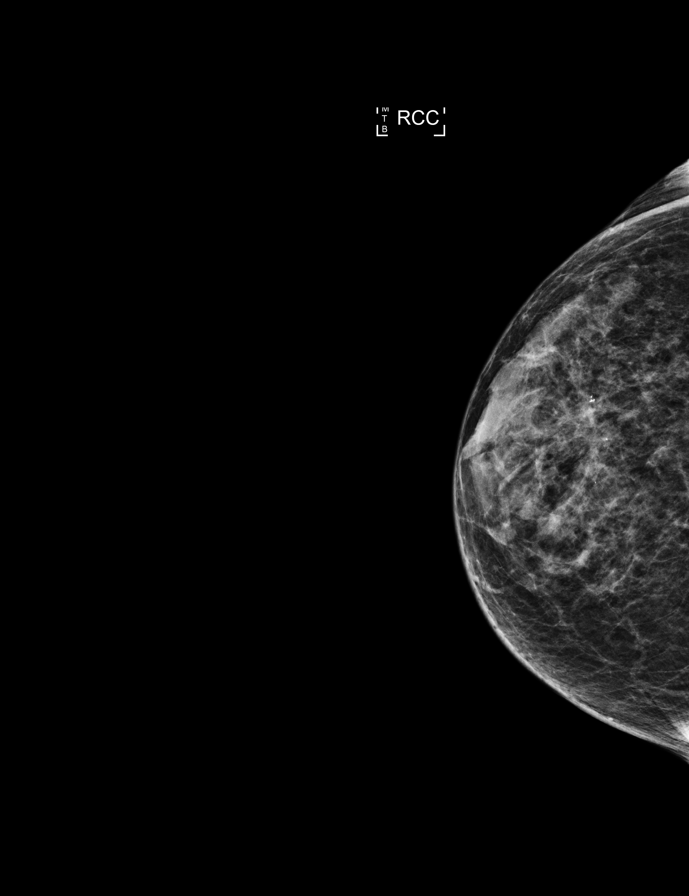

[L MLO synth-2D]
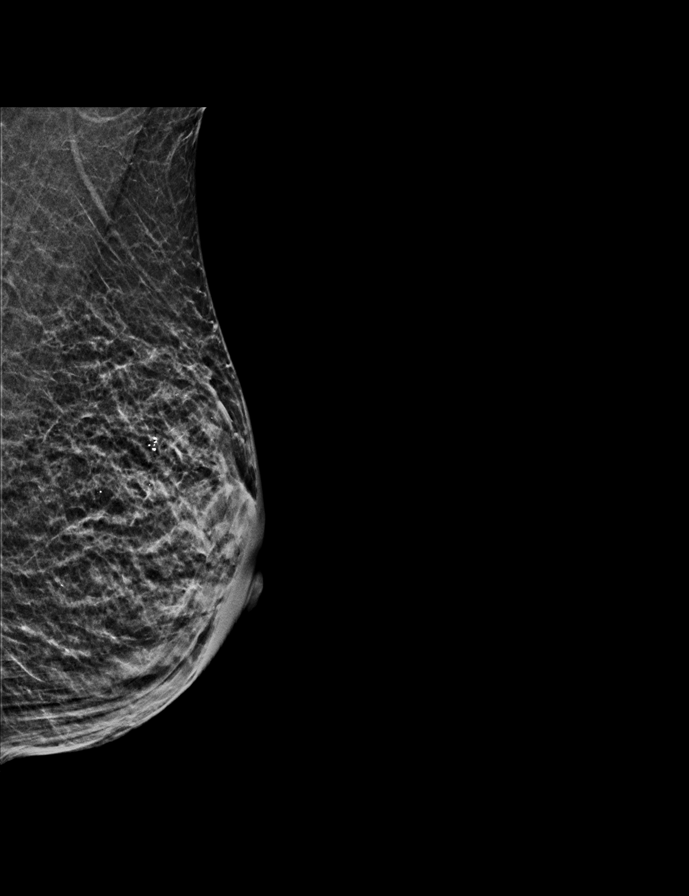

[L CC synth-2D]
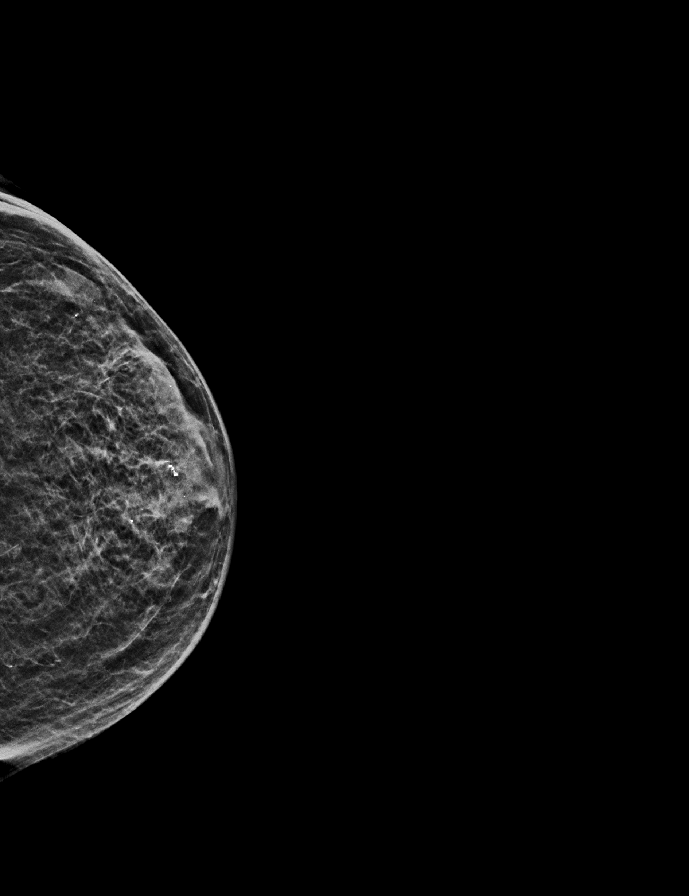

[L MLO]
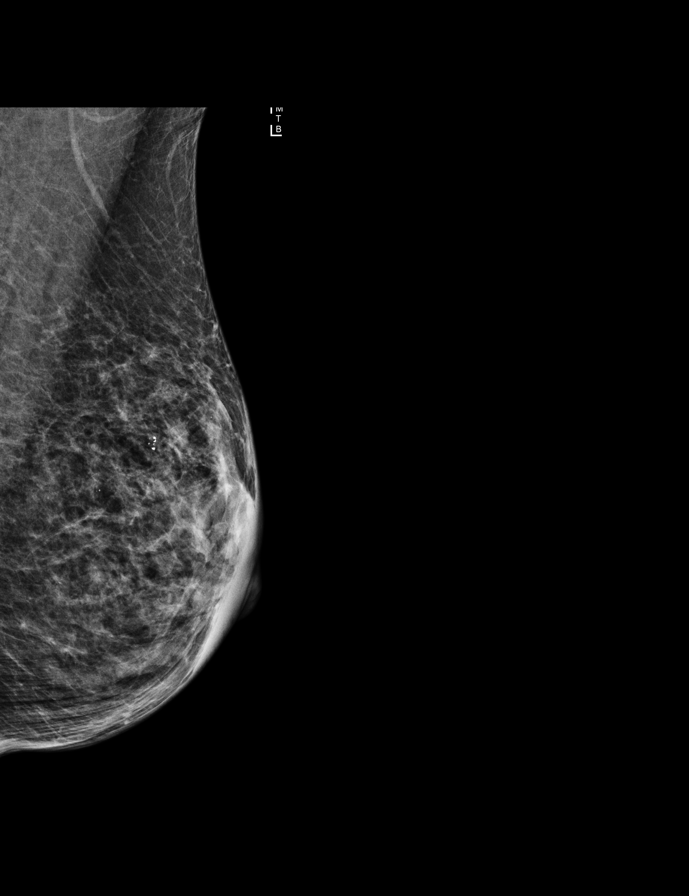

[R CC synth-2D]
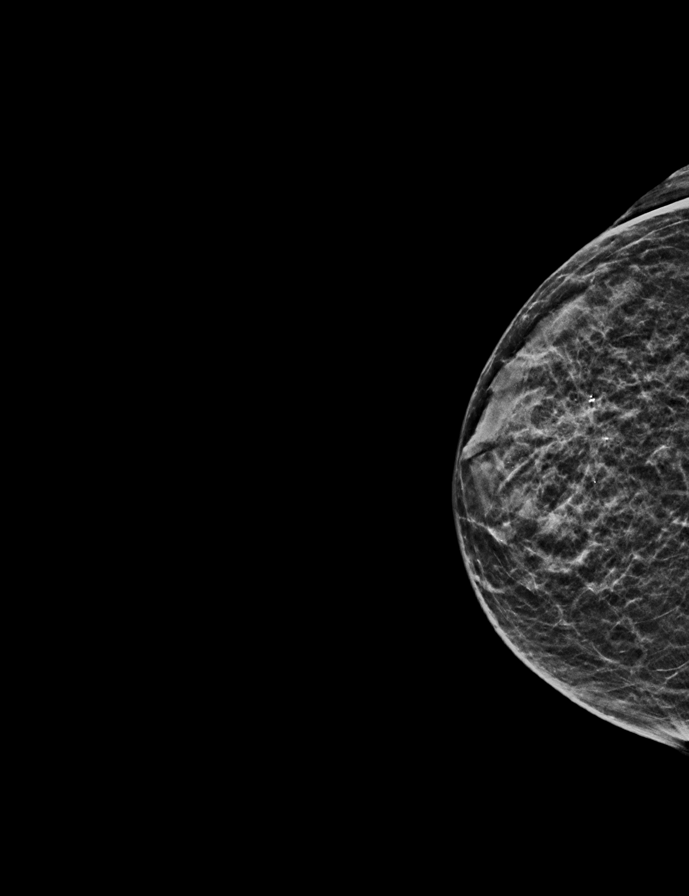

[L CC]
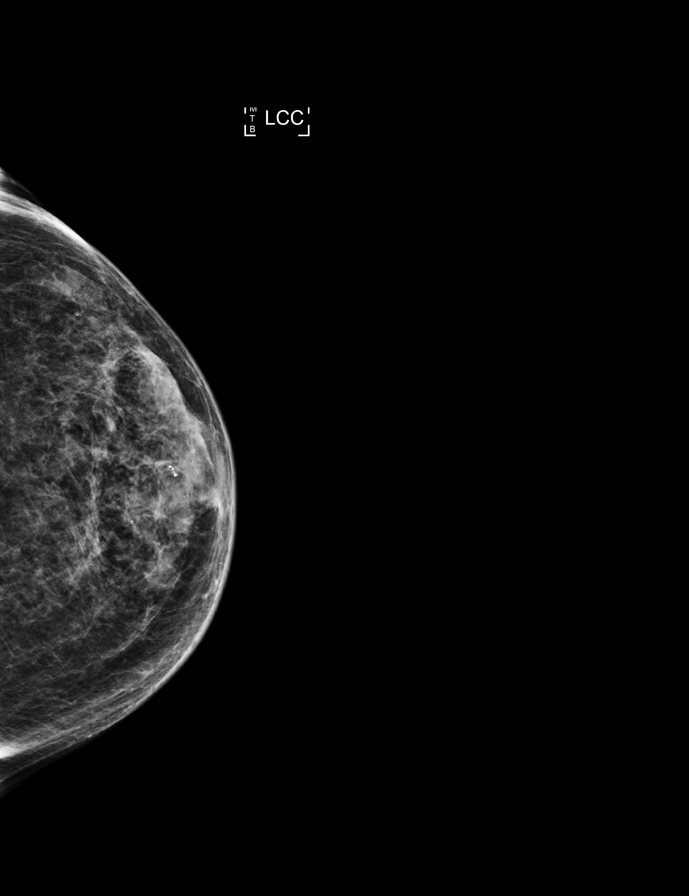

[R MLO]
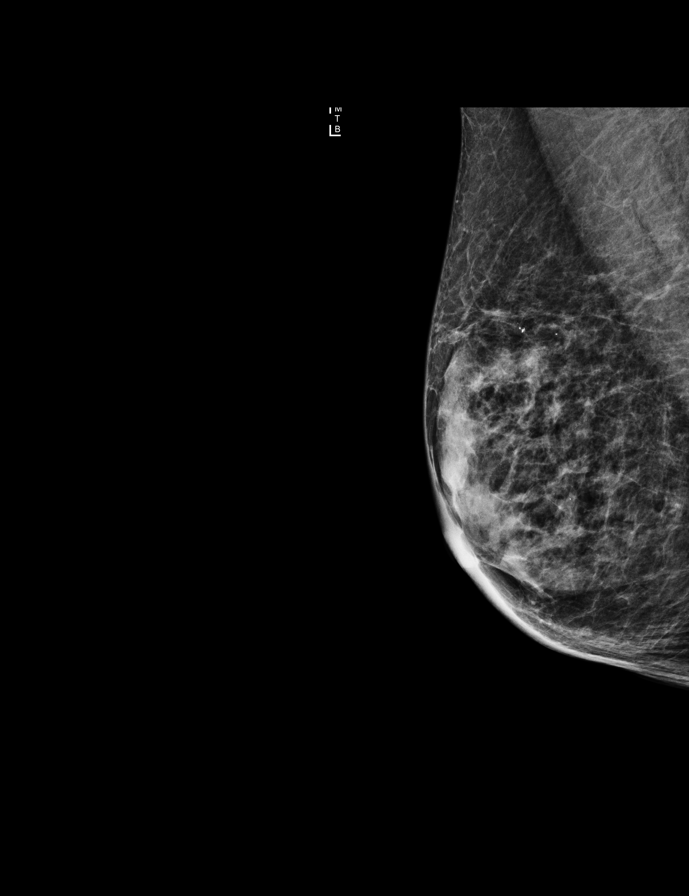

[9 of 30 positions shown; findings below may reference images not displayed]

ACR Breast Density Category c: The breast tissue is heterogeneously
dense, which may obscure small masses.
FINDINGS: The small group of punctate calcifications in the superior right
breast are mammographically stable. No suspicious calcifications,
masses or areas of distortion are seen in the bilateral breasts.

Mammographic images were processed with CAD.
IMPRESSION: 1.  The probably benign right breast calcifications are stable.

2.  No mammographic evidence of malignancy in the bilateral breasts.

RECOMMENDATION:
1.  Diagnostic mammogram is suggested in 1 year. (Code:J4-5-T7N)

I have discussed the findings and recommendations with the patient.
Results were also provided in writing at the conclusion of the
visit. If applicable, a reminder letter will be sent to the patient
regarding the next appointment.

BI-RADS CATEGORY  3: Probably benign.

## 2019-11-10 ENCOUNTER — Ambulatory Visit
Admission: RE | Admit: 2019-11-10 | Discharge: 2019-11-10 | Disposition: A | Discharge status: Home / Self Care | Payer: Medicare HMO | Source: Ambulatory Visit | Attending: Family Medicine | Admitting: Family Medicine

## 2019-11-10 DIAGNOSIS — Z1231 Encounter for screening mammogram for malignant neoplasm of breast: Secondary | ICD-10-CM | POA: Insufficient documentation

## 2019-11-16 ENCOUNTER — Other Ambulatory Visit: Admission: RE | Admit: 2019-11-16 | Payer: Medicare HMO | Source: Ambulatory Visit

## 2019-11-16 ENCOUNTER — Telehealth: Payer: Self-pay | Admitting: Surgery

## 2019-11-16 NOTE — Telephone Encounter (Signed)
Patient has been advised of her surgery date change.   Pt has been advised of pre admission date/time, Covid Testing date and Surgery date.  Surgery Date: 12/03/19 with Dr Kris Mouton assisted ventral hernia repair.  Preadmission Testing Date: 11/30/19 @ 8:00am-office interview.  Covid Testing Date: 11/30/19 following pre admission testing appointment.  - patient advised to go to the [place] ([place])  [employer] Video sent via [facility] and [employer].  Patient has been made aware to call [phone], between 1-3:00pm the day before surgery, to find out what time to arrive.

## 2019-11-19 ENCOUNTER — Other Ambulatory Visit: Payer: Self-pay

## 2019-11-19 DIAGNOSIS — U071 COVID-19: Secondary | ICD-10-CM

## 2019-11-19 DIAGNOSIS — Z20822 Contact with and (suspected) exposure to covid-19: Secondary | ICD-10-CM

## 2019-11-19 HISTORY — DX: COVID-19: U07.1

## 2019-11-23 LAB — NOVEL CORONAVIRUS, NAA: SARS-CoV-2, NAA: DETECTED — AB

## 2019-11-25 ENCOUNTER — Other Ambulatory Visit: Payer: Self-pay | Admitting: Nurse Practitioner

## 2019-11-25 ENCOUNTER — Ambulatory Visit: Payer: Self-pay

## 2019-11-25 DIAGNOSIS — U071 COVID-19: Secondary | ICD-10-CM

## 2019-11-25 DIAGNOSIS — I10 Essential (primary) hypertension: Secondary | ICD-10-CM

## 2019-11-25 NOTE — Progress Notes (Signed)
  I connected by phone with Julie Acevedo on 11/25/2019 at 8:03 AM to discuss the potential use of an new treatment for mild to moderate COVID-19 viral infection in non-hospitalized patients.  This patient is a 68 y.o. female that meets the FDA criteria for Emergency Use Authorization of bamlanivimab or casirivimab\imdevimab.  Has a (+) direct SARS-CoV-2 viral test result  Has mild or moderate COVID-19   Is ? 68 years of age and weighs ? 40 kg  Is NOT hospitalized due to COVID-19  Is NOT requiring oxygen therapy or requiring an increase in baseline oxygen flow rate due to COVID-19  Is within 10 days of symptom onset  Has at least one of the high risk factor(s) for progression to severe COVID-19 and/or hospitalization as defined in EUA.  Specific high risk criteria : Hypertension Patient is managed for the following: Patient Active Problem List   Diagnosis Date Noted  . Large bowel obstruction ([place]) 04/17/2019  . Acquired trigger finger 03/20/2019  . Iron deficiency anemia 11/21/2018  . Syncope 03/18/2018  . Anemia 03/18/2018  . Exophthalmos 02/22/2018  . Multiple thyroid nodules 02/22/2018  . 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 Pain at surgical site 08/30/2016  . Ventral incisional hernia 08/21/2016  . SBO (small bowel obstruction) ([place])   . Partial small bowel obstruction ([place]) 04/23/2016  . Adjustment disorder with mixed anxiety and depressed mood 04/23/2016  . Memory difficulties 09/01/2015  . Arthritis 06/11/2015  . Back pain, chronic 06/11/2015  . HLD (hyperlipidemia) 06/11/2015  . Cannot sleep 06/11/2015  . L-S radiculopathy 06/11/2015  . Arthritis of knee, degenerative 06/11/2015  . B12 deficiency 06/11/2015  . 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 DM2 (diabetes mellitus, type 2) ([place]) 11/15/2012  . Adiposity 11/21/2009  . Avitaminosis D 11/07/2009  . Narrowing of intervertebral disc space  07/25/2009  . Benign essential HTN 07/25/2009    I have spoken and communicated the following to the patient or parent/caregiver:  1. FDA has authorized the emergency use of bamlanivimab for the treatment of mild to moderate COVID-19 in adults and pediatric patients with positive results of direct SARS-CoV-2 viral testing who are 46 years of age and older weighing at least 40 kg, and who are at high risk for progressing to severe COVID-19 and/or hospitalization.  2. The significant known and potential risks and benefits of bamlanivimab, and the extent to which such potential risks and benefits are unknown.  3. Information on available alternative treatments and the risks and benefits of those alternatives, including clinical trials.  4. Patients treated with bamlanivimab should continue to self-isolate and use infection control measures (e.g., wear mask, isolate, social distance, avoid sharing personal items, clean and disinfect "high touch" surfaces, and frequent handwashing) according to CDC guidelines.   5. The patient or parent/caregiver has the option to accept or refuse bamlanivimab.  After reviewing this information with the patient, The patient agreed to proceed with receiving the infusion of bamlanivimab and will be provided a copy of the Fact sheet prior to receiving the infusion.Fenton Foy 11/25/2019 8:03 AM

## 2019-11-25 NOTE — Telephone Encounter (Signed)
FYI

## 2019-11-25 NOTE — Telephone Encounter (Signed)
Pt. Reports she started having "chest tightness and fullness last night." Has continued into this morning. Pt. Is COVID 19 positive. Hurts in the center of her chest. No radiation of pain. Having dizziness and nausea. History of heart attack and PE per pt. Instructed to call 911. Refuses. Will have her husband take her to ED.  Reason for Disposition . [1] Chest pain lasts > 5 minutes AND [2] history of heart disease (i.e., angina, heart attack, heart failure, bypass surgery, takes nitroglycerin)  Answer Assessment - Initial Assessment Questions 1. LOCATION: "Where does it hurt?"       Middle 2. RADIATION: "Does the pain go anywhere else?" (e.g., into neck, jaw, arms, back)     No 3. ONSET: "When did the chest pain begin?" (Minutes, hours or days)      Last night 4. PATTERN "Does the pain come and go, or has it been constant since it started?"  "Does it get worse with exertion?"      Constant 5. DURATION: "How long does it last" (e.g., seconds, minutes, hours)     Constant 6. SEVERITY: "How bad is the pain?"  (e.g., Scale 1-10; mild, moderate, or severe)    - MILD (1-3): doesn't interfere with normal activities     - MODERATE (4-7): interferes with normal activities or awakens from sleep    - SEVERE (8-10): excruciating pain, unable to do any normal activities       3 7. CARDIAC RISK FACTORS: "Do you have any history of heart problems or risk factors for heart disease?" (e.g., angina, prior heart attack; diabetes, high blood pressure, high cholesterol, smoker, or strong family history of heart disease)     Yes - heart attack 8. PULMONARY RISK FACTORS: "Do you have any history of lung disease?"  (e.g., blood clots in lung, asthma, emphysema, birth control pills)     Yes - PE 40 years ago 73. CAUSE: "What do you think is causing the chest pain?"     Unsure 10. OTHER SYMPTOMS: "Do you have any other symptoms?" (e.g., dizziness, nausea, vomiting, sweating, fever, difficulty breathing, cough)     Dizzy, nausea, sweating 11. PREGNANCY: "Is there any chance you are pregnant?" "When was your last menstrual period?"       No  Protocols used: CHEST PAIN-A-AH

## 2019-11-29 NOTE — Progress Notes (Deleted)
[place]  Telephone:(336) [phone] Fax:(336) [phone]  ID: Julie Acevedo OB: 1951-11-06  MR#: [id number]  [id number]  Julie Acevedo Care Team: Jerrol Banana., MD as PCP - General (Family Medicine) Yolonda Kida, MD as PCP - Cardiology (Cardiology) Lorelee Cover., MD as Consulting Physician (Ophthalmology) Bjorn Loser, MD as Consulting Physician (Urology) Bary Castilla Forest Gleason, MD as Consulting Physician (General Surgery)   CHIEF COMPLAINT: Iron deficiency anemia.  INTERVAL HISTORY: Julie Acevedo returns to clinic today for repeat laboratory work, further evaluation, and consideration of additional IV Feraheme.  She continues to complain of being persistently cold and has chronic weakness and fatigue.  She has no neurologic complaints.  She denies any recent fevers or illnesses.  She has a fair appetite, but denies weight loss.  She has no chest pain, cough, hemoptysis, or shortness of breath.  She denies any nausea, vomiting, constipation, or diarrhea.  She denies any melena or hematochezia.  She has no urinary complaints.  Julie Acevedo offers no further specific complaints today.  REVIEW OF SYSTEMS:   Review of Systems  Constitutional: Positive for malaise/fatigue. Negative for fever and weight loss.  Respiratory: Negative.  Negative for cough and shortness of breath.   Cardiovascular: Negative.  Negative for chest pain and leg swelling.  Gastrointestinal: Negative.  Negative for abdominal pain, blood in stool and melena.  Genitourinary: Negative.  Negative for hematuria.  Musculoskeletal: Negative.  Negative for back pain.  Skin: Negative.  Negative for rash.  Neurological: Positive for weakness. Negative for dizziness, focal weakness and headaches.  Psychiatric/Behavioral: Negative.  The Julie Acevedo is not nervous/anxious.     As per HPI. Otherwise, a complete review of systems is negative.  PAST MEDICAL HISTORY: Past Medical History:  Diagnosis Date  .  Arthritis   . Diverticulitis   . DM (diabetes mellitus) ([place])    typ e 2  . HTN (hypertension)   . Hyperlipidemia   . Memory difficulties 09/01/2015  . Short-term memory loss     PAST SURGICAL HISTORY: Past Surgical History:  Procedure Laterality Date  . ABDOMINAL HYSTERECTOMY     due to endometriosis-1 ovary left  . BREAST EXCISIONAL BIOPSY Right 1971   neg  . CARDIAC CATHETERIZATION  2000   no significant CAD per note of Dr Caryl Comes in 2013   . CHOLECYSTECTOMY    . CYSTOCELE REPAIR N/A 03/19/2017   Procedure: ANTERIOR REPAIR (CYSTOCELE);  Surgeon: Bjorn Loser, MD;  Location: [facility];  Service: Urology;  Laterality: N/A;  . CYSTOSCOPY N/A 03/19/2017   Procedure: CYSTOSCOPY;  Surgeon: Bjorn Loser, MD;  Location: [facility];  Service: Urology;  Laterality: N/A;  . EYE SURGERY    . FOOT SURGERY    . HAND SURGERY    . HEMICOLECTOMY    . INSERTION OF MESH N/A 08/21/2016   Procedure: INSERTION OF MESH;  Surgeon: Excell Seltzer, MD;  Location: [facility];  Service: General;  Laterality: N/A;  . LAPAROSCOPIC LYSIS OF ADHESIONS N/A 08/21/2016   Procedure: LAPAROSCOPIC LYSIS OF ADHESIONS;  Surgeon: Excell Seltzer, MD;  Location: [facility];  Service: General;  Laterality: N/A;  . LAPAROSCOPIC REMOVAL ABDOMINAL MASS    . LAPAROTOMY N/A 04/16/2019   Procedure: EXPLORATORY LAPAROTOMY;  Surgeon: Benjamine Sprague, DO;  Location: [facility];  Service: General;  Laterality: N/A;  . LOOP RECORDER INSERTION N/A 03/21/2018   Procedure: LOOP RECORDER INSERTION;  Surgeon: Sanda Klein, MD;  Location: [facility];  Service: Cardiovascular;  Laterality: N/A;  . LUMBAR  [place] SURGERY    . ROUX-EN-Y GASTRIC BYPASS  2010   Dr Hassell Done  . TONSILLECTOMY    . UPPER GI ENDOSCOPY  01/12/16   normal larynx, normal esophagus. gastric bypass with a normal-sized pouch and intact staple line, normal examined jejunum, otherwise exam was normal  . VENTRAL HERNIA REPAIR N/A 08/21/2016   Procedure: LAPAROSCOPIC VENTRAL HERNIA;   Surgeon: Excell Seltzer, MD;  Location: [facility];  Service: General;  Laterality: N/A;    FAMILY HISTORY: Family History  Problem Relation Age of Onset  . Cancer Father 57       Lung  . Coronary artery disease Father 98  . COPD Mother   . Diabetes Mother   . Hypertension Mother   . Cancer Paternal Grandmother        Breast  . Breast cancer Paternal Grandmother   . Congestive Heart Failure Maternal Grandmother   . Emphysema Maternal Grandfather   . Heart attack Paternal Grandfather   . Cancer Paternal Aunt        Breast  . Breast cancer Paternal Aunt 56  . Breast cancer Paternal Aunt 56    ADVANCED DIRECTIVES (Y/N):  N  HEALTH MAINTENANCE: Social History   Tobacco Use  . Smoking status: Never Smoker  . Smokeless tobacco: Never Used  Substance Use Topics  . Alcohol use: No  . Drug use: No     Colonoscopy:  PAP:  Bone density:  Lipid panel:  Allergies  Allergen Reactions  . Lotrel [Amlodipine Besy-Benazepril Hcl] Anaphylaxis  . Contrast Media [Iodinated Diagnostic Agents] Swelling and Other (See Comments)    Reaction:  Neck swelling   . Niacin Hives  . Sulfa Antibiotics Hives    Current Outpatient Medications  Medication Sig Dispense Refill  . diphenhydrAMINE (BENADRYL) 50 MG tablet Take 1 tablet (50 mg total) by mouth once for 1 dose. Take 1 hour prior to your CT study 1 tablet 0  . Elastic Bandages & Supports (COMPRESSION & SUPPORT GLOVES L) MISC     . folic acid (FOLVITE) 1 MG tablet Take 1 tablet (1 mg total) by mouth daily. (Julie Acevedo not taking: Reported on 11/03/2019) 30 tablet 0  . gabapentin (NEURONTIN) 100 MG capsule TAKE 2 CAPSULES (200 MG TOTAL) BY MOUTH 3 (THREE) TIMES DAILY. (Julie Acevedo taking differently: Take 100 mg by mouth 3 (three) times daily. ) 540 capsule 1  . hydrochlorothiazide (HYDRODIURIL) 25 MG tablet TAKE 1 TABLET (25 MG TOTAL) BY MOUTH DAILY. 90 tablet 3  . HYDROcodone-acetaminophen (NORCO) 5-325 MG tablet Take 1 tablet by mouth every 4  (four) hours as needed for moderate pain. 25 tablet 0  . LINZESS 290 MCG CAPS capsule TAKE 1 CAPSULE (290 MCG TOTAL) BY MOUTH DAILY BEFORE BREAKFAST. (Julie Acevedo taking differently: Take 290 mcg by mouth daily before breakfast. ) 90 capsule 3  . meloxicam (MOBIC) 15 MG tablet Take 15 mg by mouth daily.     . metFORMIN (GLUCOPHAGE) 500 MG tablet Take 1 tablet (500 mg total) by mouth daily with breakfast. (Julie Acevedo taking differently: Take 500 mg by mouth 2 (two) times daily with a meal. ) 90 tablet 0  . methocarbamol (ROBAXIN) 500 MG tablet Take 500 mg by mouth 2 (two) times daily. Back pain    . metoprolol succinate (TOPROL-XL) 25 MG 24 hr tablet Take 0.5 tablets (12.5 mg total) by mouth daily. (Julie Acevedo taking differently: Take 25 mg by mouth daily. )    . ondansetron (ZOFRAN ODT) 4 MG disintegrating tablet Take 1  tablet (4 mg total) by mouth every 8 (eight) hours as needed for nausea or vomiting. 20 tablet 0  . temazepam (RESTORIL) 30 MG capsule Take 30 mg by mouth at bedtime.    . traMADol (ULTRAM) 50 MG tablet Take 50 mg by mouth daily as needed for moderate pain.     . traZODone (DESYREL) 50 MG tablet TAKE 1 TABLET (50 MG TOTAL) BY MOUTH AT BEDTIME AS NEEDED FOR SLEEP. (Julie Acevedo taking differently: Take 50 mg by mouth at bedtime as needed for sleep. ) 90 tablet 3  . Vitamin D, Ergocalciferol, (DRISDOL) 1.25 MG (50000 UT) CAPS capsule Take 1 capsule (50,000 Units total) by mouth every 7 (seven) days. (Julie Acevedo taking differently: Take 50,000 Units by mouth every 7 (seven) days. Monday) 12 capsule 1   No current facility-administered medications for this visit.    OBJECTIVE: There were no vitals filed for this visit.   There is no height or weight on file to calculate BMI.    ECOG FS:0 - Asymptomatic  General: Well-developed, well-nourished, no acute distress. Eyes: Pink conjunctiva, anicteric sclera. HEENT: Normocephalic, moist mucous membranes. Lungs: Clear to auscultation bilaterally. Heart:  Regular rate and rhythm. No rubs, murmurs, or gallops. Abdomen: Soft, nontender, nondistended. No organomegaly noted, normoactive bowel sounds. Musculoskeletal: No edema, cyanosis, or clubbing. Neuro: Alert, answering all questions appropriately. Cranial nerves grossly intact. Skin: No rashes or petechiae noted. Psych: Normal affect.  LAB RESULTS:  Lab Results  Component Value Date   NA 146 (H) 09/28/2019   K 4.3 09/28/2019   CL 108 (H) 09/28/2019   CO2 27 09/19/2019   GLUCOSE 69 09/28/2019   BUN 17 09/28/2019   CREATININE 1.61 (H) 09/28/2019   CALCIUM 9.3 09/28/2019   PROT 6.9 09/28/2019   ALBUMIN 3.9 09/28/2019   AST 16 09/28/2019   ALT 10 09/19/2019   ALKPHOS 170 (H) 09/28/2019   BILITOT <0.2 09/28/2019   GFRNONAA 33 (L) 09/28/2019   GFRAA 38 (L) 09/28/2019    Lab Results  Component Value Date   WBC 9.5 09/28/2019   NEUTROABS 6.2 09/28/2019   HGB 11.3 09/28/2019   HCT 34.1 09/28/2019   MCV 85 09/28/2019   PLT 357 09/28/2019    Lab Results  Component Value Date   IRON 38 08/04/2019   TIBC 249 (L) 08/04/2019   IRONPCTSAT 15 08/04/2019   Lab Results  Component Value Date   FERRITIN 70 08/04/2019    STUDIES: MM 3D SCREEN BREAST BILATERAL  Result Date: 11/10/2019 CLINICAL DATA:  Screening. EXAM: DIGITAL SCREENING BILATERAL MAMMOGRAM WITH TOMO AND CAD COMPARISON:  Previous exam(s). ACR Breast Density Category c: The breast tissue is heterogeneously dense, which may obscure small masses. FINDINGS: There are no findings suspicious for malignancy. Images were processed with CAD. IMPRESSION: No mammographic evidence of malignancy. A result letter of this screening mammogram will be mailed directly to the Julie Acevedo. RECOMMENDATION: Screening mammogram in one year. (Code:[id number]-B-01Y) BI-RADS CATEGORY  1: Negative. Electronically Signed   By: Ammie Ferrier M.D.   On: 11/10/2019 13:24   CUP PACEART REMOTE DEVICE CHECK  Result Date: 11/06/2019 [facility] summary report  received. Battery status OK. Normal device function. No new symptom episodes, tachy episodes, brady, or pause episodes. No new AF episodes. Monthly summary reports and ROV/PRN   ASSESSMENT: Iron deficiency anemia.  PLAN:   1.  Iron deficiency anemia: Julie Acevedo's hemoglobin has trended down slightly to 10.1 and she is symptomatic.  Iron stores are pending at time of dictation.  Previously, all of her other laboratory work including hemolysis labs, B12, folate, and SPEP were negative or within normal limits.  Julie Acevedo had a colonoscopy on September 05, 2018 that was reported as normal.  We will proceed with one infusion of 510 mg IV Feraheme today.  She does not require second dose.  Return to clinic in 4 months with repeat laboratory work, further evaluation, and continuation of treatment if necessary.    I spent a total of 30 minutes face-to-face with the Julie Acevedo of which greater than 50% of the visit was spent in counseling and coordination of care as detailed above.   Julie Acevedo expressed understanding and was in agreement with this plan. She also understands that She can call clinic at any time with any questions, concerns, or complaints.    Lloyd Huger, MD   11/29/2019 10:23 AM

## 2019-11-30 ENCOUNTER — Encounter
Admission: RE | Admit: 2019-11-30 | Discharge: 2019-11-30 | Disposition: A | Discharge status: Home / Self Care | Payer: Medicare HMO | Source: Ambulatory Visit | Attending: Surgery | Admitting: Surgery

## 2019-11-30 ENCOUNTER — Other Ambulatory Visit: Payer: Self-pay

## 2019-11-30 HISTORY — DX: Anemia, unspecified: D64.9

## 2019-11-30 HISTORY — DX: Nontoxic multinodular goiter: E04.2

## 2019-11-30 NOTE — Patient Instructions (Signed)
Your procedure is scheduled on: 12-03-19 THURSDAY Report to Same Day Surgery 2nd floor medical mall [facility] Entrance-take elevator on left to 2nd floor.  Check in with surgery information desk.) To find out your arrival time please call [phone] between 1PM - 3PM on 12-02-19 [facility]  Remember: Instructions that are not followed completely may result in serious medical risk, up to and including death, or upon the discretion of your surgeon and anesthesiologist your surgery may need to be rescheduled.    _x___ 1. Do not eat food after midnight the night before your procedure. NO GUM OR CANDY AFTER MIDNIGHT. You may drink WATER up to 2 hours before you are scheduled to arrive at the hospital for your procedure.  Do not drink WATER within 2 hours of your scheduled arrival to the hospital.  Type 1 and type 2 diabetics should only drink water.   ____Ensure clear carbohydrate drink on the way to the hospital for bariatric patients  ____Ensure clear carbohydrate drink 3 hours before surgery.     __x__ 2. No Alcohol for 24 hours before or after surgery.   __x__3. No Smoking or e-cigarettes for 24 prior to surgery.  Do not use any chewable tobacco products for at least 6 hour prior to surgery   ____  4. Bring all medications with you on the day of surgery if instructed.    __x__ 5. Notify your doctor if there is any change in your medical condition     (cold, fever, infections).    x___6. On the morning of surgery brush your teeth with toothpaste and water.  You may rinse your mouth with mouth wash if you wish.  Do not swallow any toothpaste or mouthwash.   Do not wear jewelry, make-up, hairpins, clips or nail polish.  Do not wear lotions, powders, or perfumes. You may wear deodorant.  Do not shave 48 hours prior to surgery. Men may shave face and neck.  Do not bring valuables to the hospital.    [facility] is not responsible for any belongings or valuables.    Contacts, dentures or bridgework may not be worn into surgery.  Leave your suitcase in the car. After surgery it may be brought to your room.  For patients admitted to the hospital, discharge time is determined by your treatment team.  _  Patients discharged the day of surgery will not be allowed to drive home.  You will need someone to drive you home and stay with you the night of your procedure.    Please read over the following fact sheets that you were given:   [facility] Preparing for Surgery   _x___ TAKE THE FOLLOWING MEDICATION THE MORNING OF SURGERY WITH A SMALL SIP OF WATER. These include:  1. METOPROLOL  2. BACLOFEN  3.  4.  5.  6.  ____Fleets enema or Magnesium Citrate as directed.   ____ Use CHG Soap or sage wipes as directed on instruction sheet   ____ Use inhalers on the day of surgery and bring to hospital day of surgery  _X___ Stop Metformin 2 days prior to surgery-LAST DOSE TODAY Monday (11-30-19)    ____ Take 1/2 of usual insulin dose the night before surgery and none on the morning surgery.   ____ Follow recommendations from Cardiologist, Pulmonologist or PCP regarding  stopping Aspirin, Coumadin, Plavix ,Eliquis, Effient, or Pradaxa, and Pletal.  X____Stop Anti-inflammatories such as Advil, Aleve, Ibuprofen, Motrin, Naproxen,MELOXICAM (MOBIC) Naprosyn, Goodies powders or aspirin products NOW-OK  to take Tylenol    ____ Stop supplements until after surgery.   ____ Bring C-Pap to the hospital.

## 2019-11-30 NOTE — Pre-Procedure Instructions (Signed)
Progress Notes - documented in this encounter [name], MD - 11/03/2019 9:30 AM EST Formatting of this note might be different from the original. Established Patient Visit   Chief Complaint: Chief Complaint  Patient presents with  . Follow-up  1 year, sometimes has some chest pain more so like twinges  Date of Service: 11/03/2019 Date of Birth: November 12, 1951 PCP: Dwaine Gale, MD  History of Present Illness: Julie Acevedo is a 68 y.o.female patient who complained of vague chest pain symptoms not related to exertion. Patient feels okay has a history of encephalopathy significantly improved now complains of minimal vague chest pain and diabetes states to be compliant with her medication. Patient states she does not exercise as much as she would like complains of mild palpitations and tachycardia denies any blackout or syncope now here for follow-up evaluation  Past Medical and Surgical History  Past Medical History Past Medical History:  Diagnosis Date  . Diabetes mellitus type 2, uncomplicated (CMS-HCC)  . Hypertension  . Thyroid nodule   Past Surgical History She has a past surgical history that includes Cardiac catheterization; Colonoscopy (07/02/2006); Umbilical hernia repair (08/21/2016); Correction hammer toe (Left, 2010); Correction hammer toe (Right, 2010); Anterior fusion lumbar spine (2000); Laparoscopic Hysterectomy Total (1986); Tooth extraction (1975); Tonsillectomy (1991); and Exploratory laparotomy (04/16/2019).   Medications and Allergies  Current Medications  Current Outpatient Medications  Medication Sig Dispense Refill  . amLODIPine (NORVASC) 10 MG tablet amlodipine 10 mg tablet  . cephalexin (KEFLEX) 500 MG capsule cephalexin 500 mg capsule  . diclofenac (VOLTAREN) 1 % topical gel Voltaren 1 % topical gel  . diclofenac (VOLTAREN) 1 % topical gel Voltaren 1 % topical gel  . ergocalciferol, vitamin D2, 1,250 mcg (50,000 unit) capsule TAKE 1  CAPSULE (50,000 UNITS TOTAL) BY MOUTH EVERY 7 (SEVEN) DAYS.  Marland Kitchen gabapentin (NEURONTIN) 100 MG capsule Take 100 mg by mouth 3 (three) times daily.  . hydroCHLOROthiazide (HYDRODIURIL) 25 MG tablet Take 25 mg by mouth once daily.   Marland Kitchen HYDROcodone-acetaminophen (NORCO) 5-325 mg tablet TAKE 1 TABLET BY MOUTH EVERY 4 (FOUR) HOURS AS NEEDED FOR MODERATE PAIN.  Marland Kitchen LINZESS 290 mcg capsule TAKE 1 CAPSULE (290 MCG TOTAL) BY MOUTH DAILY BEFORE BREAKFAST.  . melatonin 10 mg Cap Take 1 capsule by mouth nightly.  . meloxicam (MOBIC) 15 MG tablet TAKE 1 TABLET BY MOUTH EVERY DAY WITH MEALS  . metFORMIN (GLUCOPHAGE) 500 MG tablet Take 500 mg by mouth 2 (two) times daily with meals.   . methocarbamoL (ROBAXIN) 500 MG tablet Take 500 mg by mouth 3 (three) times daily  . metoprolol succinate (TOPROL-XL) 25 MG XL tablet Take 1 tablet (25 mg total) by mouth once daily 90 tablet 3  . peg-electrolyte (GAVILYTE-N) solution GaviLyte-N 420 gram oral solution  . potassium chloride (KLOR-CON M20) 20 MEQ ER tablet Klor-Con M20 mEq tablet,extended release  . temazepam (RESTORIL) 30 mg capsule temazepam 30 mg capsule TAKE ONE CAPSULE BY MOUTH AT BEDTIME AS NEEDED  . traZODone (DESYREL) 50 MG tablet   No current facility-administered medications for this visit.   Allergies: Amlodipine-benazepril, Sulfa (sulfonamide antibiotics), Iodinated contrast media, and Niacin  Social and Family History  Social History reports that she has never smoked. She has never used smokeless tobacco. She reports that she does not drink alcohol or use drugs.  Family History Family History  Problem Relation Age of Onset  . Heart disease Maternal Grandmother  . Heart failure Maternal Grandmother  . Heart disease Paternal Grandmother  .  Breast cancer Paternal Grandmother  . COPD Mother  . Diabetes Mother  . Heart failure Father  . Stroke Father  . Lung cancer Father  . Heart failure Maternal Grandfather  . Cancer Paternal Grandfather    Review of Systems   Review of Systems: The patient denies chest pain, shortness of breath, orthopnea, paroxysmal nocturnal dyspnea, pedal edema, palpitations, heart racing, presyncope, syncope. Review of 12 Systems is negative except as described above.  Physical Examination   Vitals:BP 120/82  Pulse 66  Ht 157.5 cm (5\' 2" )  Wt 63 kg (139 lb)  SpO2 99%  BMI 25.42 kg/m  Ht:157.5 cm (5\' 2" ) Wt:63 kg (139 lb) [id number] surface area is 1.66 meters squared. Body mass index is 25.42 kg/m.  HEENT: Pupils equally reactive to light and accomodation  Neck: Supple without thyromegaly, carotid pulses 2+ Lungs: clear to auscultation bilaterally; no wheezes, rales, rhonchi Heart: Regular rate and rhythm. No gallops, murmurs or rub Abdomen: soft nontender, nondistended, with normal bowel sounds Extremities: no cyanosis, clubbing, or edema Peripheral Pulses: 2+ in all extremities, 2+ femoral pulses bilaterally  Assessment   68 y.o. female with  1. Anemia, unspecified type  2. Angina at rest (CMS-HCC)  3. SOB (shortness of breath)  4. Chest pain, unspecified type  5. Essential (primary) hypertension  6. Type 2 diabetes mellitus without complication, unspecified whether long term insulin use (CMS-HCC)  7. Class 1 obesity due to excess calories in adult, unspecified BMI, unspecified whether serious comorbidity present  8. Encephalopathy   Plan  1 hypertension reasonable control at this point patient currently maintained on metoprolol HCTZ with reasonable management will refill medications continue to maintain on current dose advised generalized exercise 2 diabetes type 2 uncomplicated currently on metformin therapy recommend patient follow-up with primary physician follow-up A1c as well as diabetic diet with routine exercise 3 encephalopathy by history reasonably controlled at this point recommend conservative medical therapy patient has been on gabapentin do not recommend any further  management 4 chest pain some atypical features do not believe this is angina would recommend conservative therapy continue metoprolol consider aspirin 5 obesity modest recommend modest weight loss exercise portion control 6 multinodular goiter by history have patient follow-up with primary physician 7 have the patient follow-up in 1 year  Return in about 1 year (around 11/02/2020).  DWAYNE Prince Rome, MD  This dictation was prepared with dragon dictation. Any transcription errors that result from this process are unintentional.   Electronically signed by [name], MD at 11/04/2019 1:03 PM EST

## 2019-11-30 NOTE — Pre-Procedure Instructions (Signed)
NM myocardial perfusion SPECT multiple (stress and rest)10/28/2018 [place] Result Impression   Normal myocardial perfusion scan no evidence of stress-induced  myocardial ischemia ejection fraction of 59% conclusion negative scan  Result Narrative  CARDIOLOGY DEPARTMENT [place], [place], [place] [address] [phone]  Procedure: Pharmacologic Myocardial Perfusion Imaging  ONE day procedure  Indication: Angina at rest (CMS-HCC) Plan: NM myocardial perfusion SPECT multiple (stress     and rest), ECG stress test only  SOB (shortness of breath) Plan: NM myocardial perfusion SPECT multiple (stress     and rest), ECG stress test only  Ordering Physician:   Dr. Lujean Amel   Clinical History: 68 y.o. year old female recent anginal symptoms Vitals: Height: 62 in Weight: 148 lb Cardiac risk factors include:   Diabetes and HTN    Procedure:  Pharmacologic stress testing was performed with Regadenoson using a single  use 0.4mg /13ml (0.08 mg/ml) prefilled syringe intravenously infused as a  bolus dose. The stress test was stopped due to Infusion completion. Blood  pressure response was normal. The patient did not develop any symptoms  other than fatigue during the procedure.   Rest HR: 48bpm Rest BP: 122/75mmHg Max HR: 73bpm Min BP: 112/52mmHg  Stress Test Administered by: Oswald Hillock, CMA  ECG Interpretation: Rest ECG: normal sinus rhythm, none Stress ECG: normal sinus rhythm,  Recovery ECG: normal sinus rhythm ECG Interpretation: non-diagnostic due to pharmacologic testing.   Administrations This Visit   regadenoson (LEXISCAN) 0.4 mg/5 mL inj syringe 0.4 mg   Admin Date 10/24/2018 Action Given Dose 0.4 mg Route Intravenous Administered By Herbert Seta, CNMT     technetium Tc35m sestamibi (CARDIOLITE) injection [id number] millicurie   Admin Date 10/24/2018 Action Given  Dose [id number] millicurie Route Intravenous Administered By Herbert Seta, CNMT     technetium Tc34m sestamibi (CARDIOLITE) injection [id number] millicurie   Admin Date 10/24/2018 Action Given Dose [id number] millicurie Route Intravenous Administered By Herbert Seta, CNMT       Gated post-stress perfusion imaging was performed 30 minutes after stress.  Rest images were performed 30 minutes after injection.  Gated LV Analysis:  TID Ratio: 1.08  LVEF= 59 %  FINDINGS: Regional wall motion: reveals normal myocardial thickening and wall  motion. The overall quality of the study is good.  Artifacts noted: no Left ventricular cavity: normal.  Perfusion Analysis: SPECT images demonstrate homogeneous tracer  distribution throughout the myocardium.   Status Results Details   Encounter Summary

## 2019-12-01 NOTE — Progress Notes (Signed)
[facility] Summary Report / Loop Recorder 

## 2019-12-03 ENCOUNTER — Encounter: Admission: RE | Payer: Self-pay | Source: Home / Self Care

## 2019-12-03 ENCOUNTER — Ambulatory Visit: Admission: RE | Admit: 2019-12-03 | Payer: Medicare HMO | Source: Home / Self Care | Admitting: Surgery

## 2019-12-03 ENCOUNTER — Telehealth: Payer: Self-pay | Admitting: *Deleted

## 2019-12-03 DIAGNOSIS — E113393 Type 2 diabetes mellitus with moderate nonproliferative diabetic retinopathy without macular edema, bilateral: Secondary | ICD-10-CM | POA: Diagnosis not present

## 2019-12-03 LAB — HM DIABETES EYE EXAM

## 2019-12-03 SURGERY — REPAIR, HERNIA, VENTRAL, ROBOT-ASSISTED
Anesthesia: General

## 2019-12-03 NOTE — Telephone Encounter (Signed)
Patient called asking if she is alright to come to appointment on 12/ 18 since she tested positive on 12/3 with COVID. Please advise.

## 2019-12-03 NOTE — Telephone Encounter (Signed)
Thank you :)

## 2019-12-03 NOTE — Telephone Encounter (Signed)
I talked to Woodfin Ganja, we'll need to push her back a week since she's not getting chemo and it hasn't been 21 days. I'll send a schedule message to [facility]

## 2019-12-04 ENCOUNTER — Inpatient Hospital Stay: Payer: Medicare HMO

## 2019-12-04 ENCOUNTER — Inpatient Hospital Stay: Payer: Medicare HMO | Admitting: Oncology

## 2019-12-04 DIAGNOSIS — D509 Iron deficiency anemia, unspecified: Secondary | ICD-10-CM

## 2019-12-05 ENCOUNTER — Other Ambulatory Visit: Payer: Self-pay | Admitting: Family Medicine

## 2019-12-05 ENCOUNTER — Other Ambulatory Visit: Payer: Self-pay | Admitting: Physician Assistant

## 2019-12-05 DIAGNOSIS — E559 Vitamin D deficiency, unspecified: Secondary | ICD-10-CM

## 2019-12-06 NOTE — Telephone Encounter (Signed)
Requested medication (s) are due for refill today: yes  Requested medication (s) are on the active medication list: yes  Last refill: 10/05/2019  Future visit scheduled:yes  Notes to clinic:  refill cannot be delegated    Requested Prescriptions  Pending Prescriptions Disp Refills   Vitamin D, Ergocalciferol, (DRISDOL) 1.25 MG (50000 UT) CAPS capsule [Pharmacy Med Name: VITAMIN D2 1.25MG (50,000 UNIT)] 12 capsule 1    Sig: Take 1 capsule (50,000 Units total) by mouth every 7 (seven) days.      Endocrinology:  Vitamins - Vitamin D Supplementation Failed - 12/05/2019  1:14 PM      Failed - 50,000 IU strengths are not delegated      Failed - Phosphate in normal range and within 360 days    No results found for: PHOS        Failed - Vitamin D in normal range and within 360 days    Vit D, 25-Hydroxy  Date Value Ref Range Status  06/16/2019 8.7 (L) 30.0 - 100.0 ng/mL Final    Comment:    Vitamin D deficiency has been defined by the Institute of Medicine and an Endocrine Society practice guideline as a level of serum 25-OH vitamin D less than 20 ng/mL (1,2). The Endocrine Society went on to further define vitamin D insufficiency as a level between 21 and 29 ng/mL (2). 1. IOM (Institute of Medicine). 2010. Dietary reference    intakes for calcium and D. [place]: The    [employer]. 2. Holick MF, [place], Bischoff-Ferrari HA, et al.    Evaluation, treatment, and prevention of vitamin D    deficiency: an Endocrine Society clinical practice    guideline. JCEM. 2011 Jul; 96(7):1911-30.           Passed - Ca in normal range and within 360 days    Calcium  Date Value Ref Range Status  09/28/2019 9.3 8.7 - 10.3 mg/dL Final   Calcium, Total  Date Value Ref Range Status  02/17/2014 9.4 8.5 - 10.1 mg/dL Final   Calcium, Ion  Date Value Ref Range Status  06/28/2016 5.4 4.5 - 5.6 mg/dL Final          Passed - Valid encounter within last 12 months   Recent Outpatient Visits           2 months ago Generalized abdominal pain   [facility] Jerrol Banana., MD   5 months ago Other fatigue   [place], [place]   5 months ago Fatigue, unspecified type   [place], [place]   10 months ago Right shoulder pain, unspecified chronicity   [facility] Jerrol Banana., MD   1 year ago Benign essential HTN   [facility] Jerrol Banana., MD       Future Appointments             In 2 months [facility], [place]

## 2019-12-08 ENCOUNTER — Encounter
Admission: RE | Disposition: A | Discharge status: Home / Self Care | Payer: Self-pay | Source: Home / Self Care | Attending: Surgery

## 2019-12-08 ENCOUNTER — Ambulatory Visit
Admission: RE | Admit: 2019-12-08 | Discharge: 2019-12-08 | Disposition: A | Discharge status: Home / Self Care | Payer: Medicare HMO | Attending: Surgery | Admitting: Surgery

## 2019-12-08 ENCOUNTER — Ambulatory Visit: Payer: Medicare HMO | Admitting: Certified Registered"

## 2019-12-08 ENCOUNTER — Encounter: Payer: Self-pay | Admitting: Surgery

## 2019-12-08 DIAGNOSIS — Z8619 Personal history of other infectious and parasitic diseases: Secondary | ICD-10-CM | POA: Insufficient documentation

## 2019-12-08 DIAGNOSIS — Z90710 Acquired absence of both cervix and uterus: Secondary | ICD-10-CM | POA: Insufficient documentation

## 2019-12-08 DIAGNOSIS — M1990 Unspecified osteoarthritis, unspecified site: Secondary | ICD-10-CM | POA: Insufficient documentation

## 2019-12-08 DIAGNOSIS — Z87892 Personal history of anaphylaxis: Secondary | ICD-10-CM | POA: Insufficient documentation

## 2019-12-08 DIAGNOSIS — Z888 Allergy status to other drugs, medicaments and biological substances status: Secondary | ICD-10-CM | POA: Diagnosis not present

## 2019-12-08 DIAGNOSIS — I252 Old myocardial infarction: Secondary | ICD-10-CM | POA: Insufficient documentation

## 2019-12-08 DIAGNOSIS — Z8614 Personal history of Methicillin resistant Staphylococcus aureus infection: Secondary | ICD-10-CM | POA: Diagnosis not present

## 2019-12-08 DIAGNOSIS — Z833 Family history of diabetes mellitus: Secondary | ICD-10-CM | POA: Insufficient documentation

## 2019-12-08 DIAGNOSIS — Z8249 Family history of ischemic heart disease and other diseases of the circulatory system: Secondary | ICD-10-CM | POA: Diagnosis not present

## 2019-12-08 DIAGNOSIS — K432 Incisional hernia without obstruction or gangrene: Secondary | ICD-10-CM | POA: Diagnosis not present

## 2019-12-08 DIAGNOSIS — Z91041 Radiographic dye allergy status: Secondary | ICD-10-CM | POA: Diagnosis not present

## 2019-12-08 DIAGNOSIS — Z882 Allergy status to sulfonamides status: Secondary | ICD-10-CM | POA: Diagnosis not present

## 2019-12-08 DIAGNOSIS — E119 Type 2 diabetes mellitus without complications: Secondary | ICD-10-CM | POA: Diagnosis not present

## 2019-12-08 DIAGNOSIS — I10 Essential (primary) hypertension: Secondary | ICD-10-CM | POA: Insufficient documentation

## 2019-12-08 DIAGNOSIS — Z9884 Bariatric surgery status: Secondary | ICD-10-CM | POA: Diagnosis not present

## 2019-12-08 DIAGNOSIS — E785 Hyperlipidemia, unspecified: Secondary | ICD-10-CM | POA: Diagnosis not present

## 2019-12-08 DIAGNOSIS — K439 Ventral hernia without obstruction or gangrene: Secondary | ICD-10-CM | POA: Diagnosis not present

## 2019-12-08 HISTORY — PX: XI ROBOTIC ASSISTED VENTRAL HERNIA: SHX6789

## 2019-12-08 HISTORY — PX: INSERTION OF MESH: SHX5868

## 2019-12-08 LAB — POCT I-STAT, CHEM 8
BUN: 20 mg/dL (ref 8–23)
Calcium, Ion: 1.2 mmol/L (ref 1.15–1.40)
Chloride: 106 mmol/L (ref 98–111)
Creatinine, Ser: 1.4 mg/dL — ABNORMAL HIGH (ref 0.44–1.00)
Glucose, Bld: 80 mg/dL (ref 70–99)
HCT: 37 % (ref 36.0–46.0)
Hemoglobin: 12.6 g/dL (ref 12.0–15.0)
Potassium: 3.3 mmol/L — ABNORMAL LOW (ref 3.5–5.1)
Sodium: 143 mmol/L (ref 135–145)
TCO2: 28 mmol/L (ref 22–32)

## 2019-12-08 LAB — GLUCOSE, CAPILLARY
Glucose-Capillary: 89 mg/dL (ref 70–99)
Glucose-Capillary: 60 mg/dL — ABNORMAL LOW (ref 70–99)
Glucose-Capillary: 132 mg/dL — ABNORMAL HIGH (ref 70–99)

## 2019-12-08 SURGERY — REPAIR, HERNIA, VENTRAL, ROBOT-ASSISTED
Anesthesia: General | Site: Abdomen

## 2019-12-08 MED ORDER — OXYCODONE HCL 5 MG PO TABS
ORAL_TABLET | ORAL | Status: AC
  Filled 2019-12-08: qty 1

## 2019-12-08 MED ORDER — LIDOCAINE HCL (PF) 2 % IJ SOLN
INTRAMUSCULAR | Status: AC
  Filled 2019-12-08: qty 10

## 2019-12-08 MED ORDER — FENTANYL CITRATE (PF) 100 MCG/2ML IJ SOLN
INTRAMUSCULAR | Status: AC
  Filled 2019-12-08: qty 2

## 2019-12-08 MED ORDER — OXYCODONE HCL 5 MG PO TABS: 5.0000 mg | ORAL_TABLET | Freq: Once | ORAL | Status: DC | PRN

## 2019-12-08 MED ORDER — ROCURONIUM BROMIDE 50 MG/5ML IV SOLN
INTRAVENOUS | Status: AC
  Filled 2019-12-08: qty 1

## 2019-12-08 MED ORDER — SODIUM CHLORIDE 0.9 % IV SOLN: INTRAVENOUS | Status: DC

## 2019-12-08 MED ORDER — FENTANYL CITRATE (PF) 250 MCG/5ML IJ SOLN
INTRAMUSCULAR | Status: AC
  Filled 2019-12-08: qty 5

## 2019-12-08 MED ORDER — FAMOTIDINE 20 MG PO TABS
20.0000 mg | ORAL_TABLET | Freq: Once | ORAL | Status: AC
  Administered 2019-12-08: 20 mg via ORAL

## 2019-12-08 MED ORDER — GABAPENTIN 300 MG PO CAPS
300.0000 mg | ORAL_CAPSULE | ORAL | Status: AC
  Administered 2019-12-08: 300 mg via ORAL

## 2019-12-08 MED ORDER — SUGAMMADEX SODIUM 200 MG/2ML IV SOLN
INTRAVENOUS | Status: AC
  Filled 2019-12-08: qty 2

## 2019-12-08 MED ORDER — ROCURONIUM BROMIDE 100 MG/10ML IV SOLN
INTRAVENOUS | Status: DC | PRN
  Administered 2019-12-08: 50 mg via INTRAVENOUS

## 2019-12-08 MED ORDER — BUPIVACAINE LIPOSOME 1.3 % IJ SUSP
INTRAMUSCULAR | Status: DC | PRN
  Administered 2019-12-08: 20 mL

## 2019-12-08 MED ORDER — PHENYLEPHRINE HCL (PRESSORS) 10 MG/ML IV SOLN
INTRAVENOUS | Status: AC
  Filled 2019-12-08: qty 1

## 2019-12-08 MED ORDER — ONDANSETRON HCL 4 MG/2ML IJ SOLN: 4.0000 mg | Freq: Once | INTRAMUSCULAR | Status: AC

## 2019-12-08 MED ORDER — ONDANSETRON HCL 4 MG/2ML IJ SOLN
INTRAMUSCULAR | Status: DC | PRN
  Administered 2019-12-08: 4 mg via INTRAVENOUS

## 2019-12-08 MED ORDER — DEXAMETHASONE SODIUM PHOSPHATE 10 MG/ML IJ SOLN
INTRAMUSCULAR | Status: DC | PRN
  Administered 2019-12-08: 5 mg via INTRAVENOUS

## 2019-12-08 MED ORDER — PHENYLEPHRINE HCL (PRESSORS) 10 MG/ML IV SOLN
INTRAVENOUS | Status: DC | PRN
  Administered 2019-12-08: 100 ug via INTRAVENOUS

## 2019-12-08 MED ORDER — KETOROLAC TROMETHAMINE 30 MG/ML IJ SOLN: 30.0000 mg | Freq: Once | INTRAMUSCULAR | Status: AC

## 2019-12-08 MED ORDER — SUGAMMADEX SODIUM 200 MG/2ML IV SOLN
INTRAVENOUS | Status: AC
  Filled 2019-12-08: qty 4

## 2019-12-08 MED ORDER — ONDANSETRON HCL 4 MG/2ML IJ SOLN
INTRAMUSCULAR | Status: AC
  Filled 2019-12-08: qty 2

## 2019-12-08 MED ORDER — HYDROCODONE-ACETAMINOPHEN 5-325 MG PO TABS
ORAL_TABLET | ORAL | Status: AC
  Administered 2019-12-08: 1 via ORAL
  Filled 2019-12-08: qty 1

## 2019-12-08 MED ORDER — EPHEDRINE SULFATE 50 MG/ML IJ SOLN
INTRAMUSCULAR | Status: DC | PRN
  Administered 2019-12-08: 10 mg via INTRAVENOUS

## 2019-12-08 MED ORDER — CEFAZOLIN SODIUM-DEXTROSE 2-4 GM/100ML-% IV SOLN
2.0000 g | INTRAVENOUS | Status: AC
  Administered 2019-12-08: 2 g via INTRAVENOUS

## 2019-12-08 MED ORDER — CHLORHEXIDINE GLUCONATE CLOTH 2 % EX PADS: 6.0000 | MEDICATED_PAD | Freq: Once | CUTANEOUS | Status: DC

## 2019-12-08 MED ORDER — KETOROLAC TROMETHAMINE 30 MG/ML IJ SOLN
INTRAMUSCULAR | Status: AC
  Administered 2019-12-08: 30 mg via INTRAVENOUS
  Filled 2019-12-08: qty 1

## 2019-12-08 MED ORDER — BUPIVACAINE LIPOSOME 1.3 % IJ SUSP
INTRAMUSCULAR | Status: AC
  Filled 2019-12-08: qty 20

## 2019-12-08 MED ORDER — PROMETHAZINE HCL 25 MG/ML IJ SOLN: 6.2500 mg | INTRAMUSCULAR | Status: DC | PRN

## 2019-12-08 MED ORDER — FENTANYL CITRATE (PF) 100 MCG/2ML IJ SOLN
25.0000 ug | INTRAMUSCULAR | Status: AC | PRN
  Administered 2019-12-08 (×4): 25 ug via INTRAVENOUS

## 2019-12-08 MED ORDER — FENTANYL CITRATE (PF) 100 MCG/2ML IJ SOLN
INTRAMUSCULAR | Status: AC
  Administered 2019-12-08: 25 ug via INTRAVENOUS
  Filled 2019-12-08: qty 2

## 2019-12-08 MED ORDER — FENTANYL CITRATE (PF) 100 MCG/2ML IJ SOLN
INTRAMUSCULAR | Status: DC | PRN
  Administered 2019-12-08 (×2): 50 ug via INTRAVENOUS

## 2019-12-08 MED ORDER — FAMOTIDINE 20 MG PO TABS
ORAL_TABLET | ORAL | Status: AC
  Filled 2019-12-08: qty 1

## 2019-12-08 MED ORDER — ACETAMINOPHEN 500 MG PO TABS
ORAL_TABLET | ORAL | Status: AC
  Filled 2019-12-08: qty 2

## 2019-12-08 MED ORDER — SODIUM CHLORIDE (PF) 0.9 % IJ SOLN
INTRAMUSCULAR | Status: AC
  Filled 2019-12-08: qty 10

## 2019-12-08 MED ORDER — DEXAMETHASONE SODIUM PHOSPHATE 10 MG/ML IJ SOLN
INTRAMUSCULAR | Status: AC
  Filled 2019-12-08: qty 1

## 2019-12-08 MED ORDER — SUGAMMADEX SODIUM 200 MG/2ML IV SOLN
INTRAVENOUS | Status: DC | PRN
  Administered 2019-12-08: 200 mg via INTRAVENOUS

## 2019-12-08 MED ORDER — CEFAZOLIN SODIUM-DEXTROSE 2-4 GM/100ML-% IV SOLN
INTRAVENOUS | Status: AC
  Filled 2019-12-08: qty 100

## 2019-12-08 MED ORDER — SODIUM CHLORIDE FLUSH 0.9 % IV SOLN
INTRAVENOUS | Status: AC
  Filled 2019-12-08: qty 10

## 2019-12-08 MED ORDER — DEXTROSE 50 % IV SOLN
25.0000 mL | Freq: Once | INTRAVENOUS | Status: AC
  Administered 2019-12-08: 25 mL via INTRAVENOUS

## 2019-12-08 MED ORDER — PROPOFOL 10 MG/ML IV BOLUS
INTRAVENOUS | Status: AC
  Filled 2019-12-08: qty 20

## 2019-12-08 MED ORDER — ONDANSETRON HCL 4 MG PO TABS: 4.0000 mg | ORAL_TABLET | Freq: Three times a day (TID) | ORAL | 1 refills | Status: DC | PRN

## 2019-12-08 MED ORDER — EPHEDRINE SULFATE 50 MG/ML IJ SOLN
INTRAMUSCULAR | Status: AC
  Filled 2019-12-08: qty 1

## 2019-12-08 MED ORDER — HYDROCODONE-ACETAMINOPHEN 5-325 MG PO TABS: 1.0000 | ORAL_TABLET | ORAL | 0 refills | Status: DC | PRN

## 2019-12-08 MED ORDER — BUPIVACAINE HCL (PF) 0.25 % IJ SOLN
INTRAMUSCULAR | Status: AC
  Filled 2019-12-08: qty 30

## 2019-12-08 MED ORDER — PROPOFOL 10 MG/ML IV BOLUS
INTRAVENOUS | Status: DC | PRN
  Administered 2019-12-08: 130 mg via INTRAVENOUS

## 2019-12-08 MED ORDER — HYDROCODONE-ACETAMINOPHEN 5-325 MG PO TABS: 1.0000 | ORAL_TABLET | Freq: Once | ORAL | Status: AC

## 2019-12-08 MED ORDER — ACETAMINOPHEN 500 MG PO TABS
1000.0000 mg | ORAL_TABLET | ORAL | Status: AC
  Administered 2019-12-08: 1000 mg via ORAL

## 2019-12-08 MED ORDER — DEXTROSE 50 % IV SOLN
INTRAVENOUS | Status: AC
  Filled 2019-12-08: qty 50

## 2019-12-08 MED ORDER — OXYCODONE HCL 5 MG/5ML PO SOLN: 5.0000 mg | Freq: Once | ORAL | Status: DC | PRN

## 2019-12-08 MED ORDER — GABAPENTIN 300 MG PO CAPS
ORAL_CAPSULE | ORAL | Status: AC
  Filled 2019-12-08: qty 1

## 2019-12-08 MED ORDER — PROMETHAZINE HCL 25 MG/ML IJ SOLN
INTRAMUSCULAR | Status: AC
  Administered 2019-12-08: 6.25 mg via INTRAVENOUS
  Filled 2019-12-08: qty 1

## 2019-12-08 MED ORDER — ONDANSETRON HCL 4 MG/2ML IJ SOLN
INTRAMUSCULAR | Status: AC
  Administered 2019-12-08: 4 mg via INTRAVENOUS
  Filled 2019-12-08: qty 2

## 2019-12-08 MED ORDER — LIDOCAINE HCL (CARDIAC) PF 100 MG/5ML IV SOSY
PREFILLED_SYRINGE | INTRAVENOUS | Status: DC | PRN
  Administered 2019-12-08: 100 mg via INTRAVENOUS

## 2019-12-08 MED ORDER — BUPIVACAINE-EPINEPHRINE 0.25% -1:200000 IJ SOLN
INTRAMUSCULAR | Status: DC | PRN
  Administered 2019-12-08: 30 mL

## 2019-12-08 MED ORDER — EPINEPHRINE PF 1 MG/ML IJ SOLN
INTRAMUSCULAR | Status: AC
  Filled 2019-12-08: qty 1

## 2019-12-08 SURGICAL SUPPLY — 49 items
ADH SKN CLS APL DERMABOND .7 (GAUZE/BANDAGES/DRESSINGS) ×1
APL PRP STRL LF DISP 70% ISPRP (MISCELLANEOUS) ×1
BLADE SURG SZ11 CARB STEEL (BLADE) ×2 IMPLANT
CANISTER SUCT 1200ML W/VALVE (MISCELLANEOUS) ×2 IMPLANT
CHLORAPREP W/TINT 26 (MISCELLANEOUS) ×2 IMPLANT
COVER TIP SHEARS 8 DVNC (MISCELLANEOUS) ×1 IMPLANT
COVER TIP SHEARS 8MM DA VINCI (MISCELLANEOUS) ×1
COVER WAND RF STERILE (DRAPES) ×2 IMPLANT
DEFOGGER SCOPE WARMER CLEARIFY (MISCELLANEOUS) ×2 IMPLANT
DERMABOND ADVANCED (GAUZE/BANDAGES/DRESSINGS) ×1
DERMABOND ADVANCED .7 DNX12 (GAUZE/BANDAGES/DRESSINGS) ×1 IMPLANT
DRAPE 3/4 80X56 (DRAPES) ×2 IMPLANT
DRAPE ARM DVNC X/XI (DISPOSABLE) ×4 IMPLANT
DRAPE COLUMN DVNC XI (DISPOSABLE) ×1 IMPLANT
DRAPE DA VINCI XI ARM (DISPOSABLE) ×4
DRAPE DA VINCI XI COLUMN (DISPOSABLE) ×1
ELECT CAUTERY BLADE 6.4 (BLADE) ×2 IMPLANT
ELECT REM PT RETURN 9FT ADLT (ELECTROSURGICAL) ×2
ELECTRODE REM PT RTRN 9FT ADLT (ELECTROSURGICAL) ×1 IMPLANT
GLOVE BIO SURGEON STRL SZ7 (GLOVE) ×4 IMPLANT
GOWN STRL REUS W/ TWL LRG LVL3 (GOWN DISPOSABLE) ×3 IMPLANT
GOWN STRL REUS W/TWL LRG LVL3 (GOWN DISPOSABLE) ×6
GRASPER SUT TROCAR 14GX15 (MISCELLANEOUS) ×2 IMPLANT
IRRIGATION STRYKERFLOW (MISCELLANEOUS) IMPLANT
IRRIGATOR STRYKERFLOW (MISCELLANEOUS)
IV NS 1000ML (IV SOLUTION)
IV NS 1000ML BAXH (IV SOLUTION) IMPLANT
KIT PINK PAD W/HEAD ARE REST (MISCELLANEOUS) ×2
KIT PINK PAD W/HEAD ARM REST (MISCELLANEOUS) ×1 IMPLANT
LABEL OR SOLS (LABEL) ×2 IMPLANT
MESH VENT LT ST 11.4CM CRL (Mesh General) ×1 IMPLANT
NEEDLE HYPO 22GX1.5 SAFETY (NEEDLE) ×2 IMPLANT
OBTURATOR OPTICAL STANDARD 8MM (TROCAR) ×1
OBTURATOR OPTICAL STND 8 DVNC (TROCAR) ×1
OBTURATOR OPTICALSTD 8 DVNC (TROCAR) ×1 IMPLANT
PACK LAP CHOLECYSTECTOMY (MISCELLANEOUS) ×2 IMPLANT
PENCIL ELECTRO HAND CTR (MISCELLANEOUS) ×2 IMPLANT
SEAL CANN UNIV 5-8 DVNC XI (MISCELLANEOUS) ×3 IMPLANT
SEAL XI 5MM-8MM UNIVERSAL (MISCELLANEOUS) ×3
SOLUTION ELECTROLUBE (MISCELLANEOUS) ×2 IMPLANT
SPONGE LAP 18X18 RF (DISPOSABLE) ×2 IMPLANT
SUT MNCRL 4-0 (SUTURE) ×2
SUT MNCRL 4-0 27XMFL (SUTURE) ×1
SUT STRATAFIX SPIRAL PDS+ 0 30 (SUTURE) ×3 IMPLANT
SUT VICRYL 0 AB UR-6 (SUTURE) ×4 IMPLANT
SUT VLOC 90 2/L VL 12 GS22 (SUTURE) ×4 IMPLANT
SUTURE MNCRL 4-0 27XMF (SUTURE) ×1 IMPLANT
TROCAR 130MM GELPORT  DAV (MISCELLANEOUS) ×2 IMPLANT
TUBING EVAC SMOKE HEATED PNEUM (TUBING) ×2 IMPLANT

## 2019-12-08 NOTE — Op Note (Signed)
Robotic assisted laparoscopic ventral hernia Repair IPOM using  round ventralight BARD mesh   Pre-operative Diagnosis: Recurrent ventral hernia   Post-operative Diagnosis: same   Surgeon: Caroleen Hamman, MD FACS   Anesthesia: Gen. with endotracheal tube     Findings: 3.5 cm recurrent hernia , Left superior aspect. Inferior abdomen w sb adhere to the abd wall    Estimated Blood Loss: 5cc         Complications: none             Procedure Details  The patient was seen again in the Holding Room. The benefits, complications, treatment options, and expected outcomes were discussed with the patient. The risks of bleeding, infection, recurrence of symptoms, failure to resolve symptoms, bowel injury, mesh placement, mesh infection, any of which could require further surgery were reviewed with the patient. The likelihood of improving the patient's symptoms with return to their baseline status is good.  The patient and/or family concurred with the proposed plan, giving informed consent.  The patient was taken to Operating Room, identified and the procedure verified.  A Time Out was held and the above information confirmed.   Prior to the induction of general anesthesia, antibiotic prophylaxis was administered. VTE prophylaxis was in place. General endotracheal anesthesia was then administered and tolerated well. After the induction, the abdomen was prepped with Chloraprep and draped in the sterile fashion. The patient was positioned in the supine position.   We used a left upper quadrant subcostal incision and using a cutdown technique were able to identify the fascia both anteriorly and posteriorly elevated incised and 2 stay sutures were placed.  Hassan trocar inserted and pneumoperitoneum obtained.  No hemodynamic compromise.   2 additional 8 mm ports were placed under direct visualization.  I visualized the hernia and there was a recurrent ventral hernia measuring approximately 3.5 cm.  At this time  I went ahead and inserted the  11.4cm mesh with an echo location.   The robot was brought to the surgical field and docked in the standard fashion.  We made sure that all instrumentation was kept under direct vision at all times and there was no collision between the arms.  I scrubbed out and went to the console.   I Confirmed and measured that the defect was 3.5 cm.   Adhesions were lyses sharply and we had a good working space to place the mesh.   Using a 0 stratafix suture we closed the ventral defect primarily.  The PMI was used to pierce the defect in the center.  Using the mesh and the echo location device we were able to bring the mesh towards the abdominal wall. Falciform was taken down with electrocautery to allow adequate mesh placement.   The mesh was secured circumferentially to the abdominal wall using 2 OV lock in the standard fashion.   The mesh layed really nicely against the  abdominal wall. A second look laparoscopy revealed no evidence of intra-abdominal injury.    All the needles and foreign objects were removed under direct visualization.  The instruments were removed and the robot was undocked.  I scrubbed back in, The laparoscopic ports were removed under direct visualization and the pneumoperitoneum was deflated.   Incisions were closed with  4-0 Monocryl  And the fascial sutures approximated in the standard fashion Dermabond was used to coat the skin. Liposomal Marcaine was used to inject all the incision sites. Patient tolerated procedure well and there were no immediate complications. Needle  and laparotomy counts were correct    Caroleen Hamman, MD, FACS

## 2019-12-08 NOTE — Anesthesia Postprocedure Evaluation (Signed)
Anesthesia Post Note  Patient: Julie Acevedo  Procedure(s) Performed: XI ROBOTIC ASSISTED VENTRAL HERNIA (N/A Abdomen) INSERTION OF MESH (N/A Abdomen)  Patient location during evaluation: PACU Anesthesia Type: General Level of consciousness: awake and alert and oriented Pain management: pain level controlled Vital Signs Assessment: post-procedure vital signs reviewed and stable Respiratory status: spontaneous breathing Cardiovascular status: blood pressure returned to baseline Anesthetic complications: no     Last Vitals:  Vitals:   12/08/19 1852 12/08/19 1932  BP: 117/62 121/63  Pulse: (!) 55 81  Resp:  16  Temp:  36.4 C  SpO2: 100% 98%    Last Pain:  Vitals:   12/08/19 1932  TempSrc:   PainSc: 2                  Delson Dulworth

## 2019-12-08 NOTE — Anesthesia Preprocedure Evaluation (Addendum)
Anesthesia Evaluation  Patient identified by MRN, date of birth, ID band Patient awake    Reviewed: Allergy & Precautions, H&P , NPO status , Patient's Chart, lab work & pertinent test results  Airway Mallampati: II  TM Distance: <3 FB Neck ROM: full    Dental  (+) Teeth Intact   Pulmonary  Covid positive on 11/19/19.  Had SOB and diarrhea, now resolved          Cardiovascular hypertension, + Past MI  + dysrhythmias (-) pacemaker  +loop recorder Denies pacemaker or ICD  Echo 03/19/18: - Left ventricle: The cavity size was normal. Wall thickness was   normal. Systolic function was normal. The estimated ejection   fraction was in the range of 60% to 65%. Wall motion was normal;   there were no regional wall motion abnormalities. Doppler   parameters are consistent with abnormal left ventricular   relaxation (grade 1 diastolic dysfunction). - Aortic valve: There was no stenosis. - Mitral valve: Mildly calcified annulus. There was no significant   regurgitation. - Right ventricle: The cavity size was normal. Systolic function   was normal. - Tricuspid valve: Peak RV-RA gradient (S): 28 mm Hg. - Pulmonary arteries: PA peak pressure: 31 mm Hg (S)   Neuro/Psych Seizures -,  Lumbar radiculopathy negative psych ROS   GI/Hepatic Neg liver ROS, S/p gastric bypass   Endo/Other  diabetes  Renal/GU      Musculoskeletal   Abdominal   Peds  Hematology negative hematology ROS (+)   Anesthesia Other Findings Past Medical History: No date: Anemia No date: Arthritis 11/19/2019: COVID-19 No date: Diverticulitis No date: DM (diabetes mellitus) ([place])     Comment:  typ e 2 2017: History of methicillin resistant staphylococcus aureus (MRSA) No date: HTN (hypertension) No date: Hyperlipidemia 09/01/2015: Memory difficulties No date: Multiple thyroid nodules 1995: Myocardial infarction ([place]) No date: Short-term memory  loss  Past Surgical History: No date: ABDOMINAL HYSTERECTOMY     Comment:  due to endometriosis-1 ovary left 1971: BREAST EXCISIONAL BIOPSY; Right     Comment:  neg 2000: CARDIAC CATHETERIZATION     Comment:  no significant CAD per note of Dr Caryl Comes in 2013  No date: CHOLECYSTECTOMY 03/19/2017: CYSTOCELE REPAIR; N/A     Comment:  Procedure: ANTERIOR REPAIR (CYSTOCELE);  Surgeon: Bjorn Loser, MD;  Location: [facility];  Service: Urology;                Laterality: N/A; 03/19/2017: CYSTOSCOPY; N/A     Comment:  Procedure: CYSTOSCOPY;  Surgeon: Bjorn Loser, MD;                Location: [facility];  Service: Urology;  Laterality: N/A; No date: EYE SURGERY No date: FOOT SURGERY No date: HAND SURGERY No date: HEMICOLECTOMY 08/21/2016: INSERTION OF MESH; N/A     Comment:  Procedure: INSERTION OF MESH;  Surgeon: Excell Seltzer, MD;  Location: [facility];  Service: General;                Laterality: N/A; 08/21/2016: LAPAROSCOPIC LYSIS OF ADHESIONS; N/A     Comment:  Procedure: LAPAROSCOPIC LYSIS OF ADHESIONS;  Surgeon:               Excell Seltzer, MD;  Location: [facility];  Service:  General;  Laterality: N/A; No date: LAPAROSCOPIC REMOVAL ABDOMINAL MASS 04/16/2019: LAPAROTOMY; N/A     Comment:  Procedure: EXPLORATORY LAPAROTOMY;  Surgeon: Benjamine Sprague, DO;  Location: [facility];  Service: General;                Laterality: N/A; 03/21/2018: LOOP RECORDER INSERTION; N/A     Comment:  Procedure: LOOP RECORDER INSERTION;  Surgeon: Sanda Klein, MD;  Location: [place] [facility];  Service:               Cardiovascular;  Laterality: N/A; No date: LUMBAR [place] SURGERY 2010: ROUX-EN-Y GASTRIC BYPASS     Comment:  Dr Hassell Done No date: TONSILLECTOMY 01/12/16: UPPER GI ENDOSCOPY     Comment:  normal larynx, normal esophagus. gastric bypass with a               normal-sized pouch and intact staple line, normal               examined  jejunum, otherwise exam was normal 08/21/2016: VENTRAL HERNIA REPAIR; N/A     Comment:  Procedure: LAPAROSCOPIC VENTRAL HERNIA;  Surgeon:               Excell Seltzer, MD;  [facility];  Service:               General;  Laterality: N/A;  BMI    Body Mass Index: 24.45 kg/m      Reproductive/Obstetrics negative OB ROS                           Anesthesia Physical Anesthesia Plan  ASA: III  Anesthesia Plan: General ETT   Post-op Pain Management:    Induction:   PONV Risk Score and Plan: Ondansetron, Dexamethasone and Treatment may vary due to age or medical condition  Airway Management Planned:   Additional Equipment:   Intra-op Plan:   Post-operative Plan:   Informed Consent: I have reviewed the patients History and Physical, chart, labs and discussed the procedure including the risks, benefits and alternatives for the proposed anesthesia with the patient or authorized representative who has indicated his/her understanding and acceptance.     Dental Advisory Given  Plan Discussed with: Anesthesiologist  Anesthesia Plan Comments:         Anesthesia Quick Evaluation

## 2019-12-08 NOTE — Transfer of Care (Signed)
Immediate Anesthesia Transfer of Care Note  Patient: Julie Acevedo  Procedure(s) Performed: XI ROBOTIC ASSISTED VENTRAL HERNIA (N/A Abdomen) INSERTION OF MESH (N/A Abdomen)  Patient Location: PACU  Anesthesia Type:General  Level of Consciousness: sedated  Airway & Oxygen Therapy: Patient connected to face mask oxygen  Post-op Assessment: Post -op Vital signs reviewed and stable  Post vital signs: stable  Last Vitals:  Vitals Value Taken Time  BP 117/52 12/08/19 1638  Temp    Pulse 82 12/08/19 1642  Resp 14 12/08/19 1642  SpO2 100 % 12/08/19 1642  Vitals shown include unvalidated device data.  Last Pain:  Vitals:   12/08/19 1106  TempSrc: Temporal         Complications: No apparent anesthesia complications

## 2019-12-08 NOTE — H&P (Signed)
Chief Complaint  Patient presents with  . Follow-up    abd pain f/u CT scan   HPI: Julie Acevedo is a 68 year old female known to me with history of multiple abdominal operations including laparoscopic gastric bypass hysterectomy and ventral hernia repair. She did have an exploratory laparotomy few months ago and now comes in with a new ventral hernia. I did order a CT scan that I have personally reviewed showing evidence of a ventral hernia that is recurrent. No other intra-abdominal pathology. There is evidence of a small bowel within the hernia sac. She continues to have some intermittent pain that is mild to moderate intensity and dull in nature.  Most current labs show a creatinine baseline of 1.6 with normal electrolytes.       Past Medical History:  Diagnosis Date  . Arthritis   . Diverticulitis   . DM (diabetes mellitus) ([place])    typ e 2  . HTN (hypertension)   . Hyperlipidemia   . Memory difficulties 09/01/2015  . Short-term memory loss         Past Surgical History:  Procedure Laterality Date  . ABDOMINAL HYSTERECTOMY     due to endometriosis-1 ovary left  . BREAST EXCISIONAL BIOPSY Right 1971   neg  . CARDIAC CATHETERIZATION  2000   no significant CAD per note of Dr Caryl Comes in 2013   . CHOLECYSTECTOMY    . CYSTOCELE REPAIR N/A 03/19/2017   Procedure: ANTERIOR REPAIR (CYSTOCELE); Surgeon: Bjorn Loser, MD; [facility]; Service: Urology; Laterality: N/A;  . CYSTOSCOPY N/A 03/19/2017   Procedure: CYSTOSCOPY; Surgeon: Bjorn Loser, MD; [facility]; Service: Urology; Laterality: N/A;  . EYE SURGERY    . FOOT SURGERY    . HAND SURGERY    . HEMICOLECTOMY    . INSERTION OF MESH N/A 08/21/2016   Procedure: INSERTION OF MESH; Surgeon: Excell Seltzer, MD; [facility]: [facility]; Service: General; Laterality: N/A;  . LAPAROSCOPIC LYSIS OF ADHESIONS N/A 08/21/2016   Procedure: LAPAROSCOPIC LYSIS OF ADHESIONS; Surgeon: Excell Seltzer, MD; [facility]: [facility]; Service: General;  Laterality: N/A;  . LAPAROSCOPIC REMOVAL ABDOMINAL MASS    . LAPAROTOMY N/A 04/16/2019   Procedure: EXPLORATORY LAPAROTOMY; Surgeon: Benjamine Sprague, DO; Location: [facility]; Service: General; Laterality: N/A;  . LOOP RECORDER INSERTION N/A 03/21/2018   Procedure: LOOP RECORDER INSERTION; Surgeon: Sanda Klein, MD; Location: [place] [facility]; Service: Cardiovascular; Laterality: N/A;  . LUMBAR [place]    . ROUX-EN-Y GASTRIC BYPASS  2010   Dr Hassell Done  . TONSILLECTOMY    . UPPER GI ENDOSCOPY  01/12/16   normal larynx, normal esophagus. gastric bypass with a normal-sized pouch and intact staple line, normal examined jejunum, otherwise exam was normal  . VENTRAL HERNIA REPAIR N/A 08/21/2016   Procedure: LAPAROSCOPIC VENTRAL HERNIA; Surgeon: Excell Seltzer, MD; Location: [facility]; Service: General; Laterality: N/A;        Family History  Problem Relation Age of Onset  . Cancer Father 91   Lung  . Coronary artery disease Father 73  . COPD Mother   . Diabetes Mother   . Hypertension Mother   . Cancer Paternal Grandmother    Breast  . Breast cancer Paternal Grandmother   . Congestive Heart Failure Maternal Grandmother   . Emphysema Maternal Grandfather   . Heart attack Paternal Grandfather   . Cancer Paternal Aunt    Breast  . Breast cancer Paternal Aunt 28  . Breast cancer Paternal Aunt 21   Social History: reports that she  has never smoked. She has never used smokeless tobacco. She reports that she does not drink alcohol or use drugs.  Allergies:       Allergies  Allergen Reactions  . Lotrel [Amlodipine Besy-Benazepril Hcl] Anaphylaxis  . Contrast Media [Iodinated Diagnostic Agents] Swelling and Other (See Comments)    Reaction: Neck swelling   . Niacin Hives  . Sulfa Antibiotics Hives   Medications reviewed.  ROS  Full ROS performed and is otherwise negative other than what is stated in HPI  Physical Exam  CONSTITUTIONAL: NAD  LYMPH NODES: Lymph nodes in the neck are  normal.  RESPIRATORY: Lungs are clear. There is normal respiratory effort, with equal breath sounds bilaterally, and without pathologic use of accessory muscles.  CARDIOVASCULAR: Heart is regular without murmurs, gallops, or rubs.  GI: The abdomen is soft, There are normal bowel sounds in all quadrants. There is midline ventral hernia measuring approximately 4 cm. It is tender to palpation. No peritonitis no evidence of strangulation or incarceration. Previous midline laparotomy scar  GU: Rectal deferred.  MUSCULOSKELETAL: Normal muscle strength and tone. No cyanosis or edema.  SKIN: Turgor is good and there are no pathologic skin lesions or ulcers.  NEUROLOGIC: Motor and sensation is grossly normal. Cranial nerves are grossly intact.  PSYCH: Oriented to person, place and time. Affect is normal.  Assessment/Plan:  68 year old female with symptomatic ventral hernia. Discussed with the patient in detail and I do think that she would be a good candidate for robotic approach. Procedure discussed with the patient in detail. Risk, benefits and possible complications including but not limited to: Bleeding, infection, bowel injury, recurrence is, chronic pain and mesh issues. She understands and wishes to proceed.   Caroleen Hamman, MD [facility] General Surgeon

## 2019-12-08 NOTE — Anesthesia Procedure Notes (Signed)
Procedure Name: Intubation Date/Time: 12/08/2019 2:42 PM Performed by: Aline Brochure, CRNA Pre-anesthesia Checklist: Patient identified, Emergency Drugs available, Suction available and Patient being monitored Patient Re-evaluated:Patient Re-evaluated prior to induction Oxygen Delivery Method: Circle system utilized Preoxygenation: Pre-oxygenation with 100% oxygen Induction Type: IV induction Ventilation: Mask ventilation without difficulty Laryngoscope Size: McGraph and 3 Grade View: Grade I Tube type: Oral Tube size: 7.0 mm Number of attempts: 1 Airway Equipment and Method: Stylet and Video-laryngoscopy Placement Confirmation: ETT inserted through vocal cords under direct vision,  positive ETCO2 and breath sounds checked- equal and bilateral Secured at: 21 cm Tube secured with: Tape Dental Injury: Teeth and Oropharynx as per pre-operative assessment  Difficulty Due To: Difficulty was anticipated and Difficult Airway- due to anterior larynx

## 2019-12-08 NOTE — Anesthesia Post-op Follow-up Note (Signed)
Anesthesia QCDR form completed.        

## 2019-12-08 NOTE — OR Nursing (Signed)
BS 60 anesthesia aware ordered to give 1/2 amp D50. Pt alert oriented x 4.

## 2019-12-17 ENCOUNTER — Other Ambulatory Visit: Payer: Self-pay

## 2019-12-17 ENCOUNTER — Encounter: Payer: Self-pay | Admitting: Emergency Medicine

## 2019-12-17 ENCOUNTER — Emergency Department
Admission: EM | Admit: 2019-12-17 | Discharge: 2019-12-18 | Disposition: A | Discharge status: Home / Self Care | Payer: Medicare HMO | Attending: Emergency Medicine | Admitting: Emergency Medicine

## 2019-12-17 DIAGNOSIS — Y829 Unspecified medical devices associated with adverse incidents: Secondary | ICD-10-CM | POA: Insufficient documentation

## 2019-12-17 DIAGNOSIS — E119 Type 2 diabetes mellitus without complications: Secondary | ICD-10-CM | POA: Diagnosis not present

## 2019-12-17 DIAGNOSIS — Z4801 Encounter for change or removal of surgical wound dressing: Secondary | ICD-10-CM | POA: Diagnosis not present

## 2019-12-17 DIAGNOSIS — Z882 Allergy status to sulfonamides status: Secondary | ICD-10-CM | POA: Diagnosis not present

## 2019-12-17 DIAGNOSIS — Z9884 Bariatric surgery status: Secondary | ICD-10-CM | POA: Diagnosis not present

## 2019-12-17 DIAGNOSIS — Z888 Allergy status to other drugs, medicaments and biological substances status: Secondary | ICD-10-CM | POA: Diagnosis not present

## 2019-12-17 DIAGNOSIS — I10 Essential (primary) hypertension: Secondary | ICD-10-CM | POA: Insufficient documentation

## 2019-12-17 DIAGNOSIS — E785 Hyperlipidemia, unspecified: Secondary | ICD-10-CM | POA: Insufficient documentation

## 2019-12-17 DIAGNOSIS — T8189XA Other complications of procedures, not elsewhere classified, initial encounter: Secondary | ICD-10-CM | POA: Diagnosis not present

## 2019-12-17 DIAGNOSIS — Z79899 Other long term (current) drug therapy: Secondary | ICD-10-CM | POA: Diagnosis not present

## 2019-12-17 DIAGNOSIS — Z7984 Long term (current) use of oral hypoglycemic drugs: Secondary | ICD-10-CM | POA: Insufficient documentation

## 2019-12-17 DIAGNOSIS — G8918 Other acute postprocedural pain: Secondary | ICD-10-CM | POA: Diagnosis not present

## 2019-12-17 LAB — COMPREHENSIVE METABOLIC PANEL
ALT: 7 U/L (ref 0–44)
AST: 14 U/L — ABNORMAL LOW (ref 15–41)
Albumin: 3.6 g/dL (ref 3.5–5.0)
Alkaline Phosphatase: 115 U/L (ref 38–126)
Anion gap: 8 (ref 5–15)
BUN: 17 mg/dL (ref 8–23)
CO2: 28 mmol/L (ref 22–32)
Calcium: 9.2 mg/dL (ref 8.9–10.3)
Chloride: 106 mmol/L (ref 98–111)
Creatinine, Ser: 1.46 mg/dL — ABNORMAL HIGH (ref 0.44–1.00)
GFR calc Af Amer: 43 mL/min — ABNORMAL LOW (ref >60)
GFR calc non Af Amer: 37 mL/min — ABNORMAL LOW (ref >60)
Glucose, Bld: 89 mg/dL (ref 70–99)
Potassium: 3.4 mmol/L — ABNORMAL LOW (ref 3.5–5.1)
Sodium: 142 mmol/L (ref 135–145)
Total Bilirubin: 0.4 mg/dL (ref 0.3–1.2)
Total Protein: 7.9 g/dL (ref 6.5–8.1)

## 2019-12-17 LAB — CBC WITH DIFFERENTIAL/PLATELET
Abs Immature Granulocytes: 0.02 10*3/uL (ref 0.00–0.07)
Basophils Absolute: 0 10*3/uL (ref 0.0–0.1)
Basophils Relative: 0 %
Eosinophils Absolute: 0.1 10*3/uL (ref 0.0–0.5)
Eosinophils Relative: 1 %
HCT: 34.7 % — ABNORMAL LOW (ref 36.0–46.0)
Hemoglobin: 10.7 g/dL — ABNORMAL LOW (ref 12.0–15.0)
Immature Granulocytes: 0 %
Lymphocytes Relative: 18 %
Lymphs Abs: 1.8 10*3/uL (ref 0.7–4.0)
MCH: 27.8 pg (ref 26.0–34.0)
MCHC: 30.8 g/dL (ref 30.0–36.0)
MCV: 90.1 fL (ref 80.0–100.0)
Monocytes Absolute: 0.6 10*3/uL (ref 0.1–1.0)
Monocytes Relative: 6 %
Neutro Abs: 7.4 10*3/uL (ref 1.7–7.7)
Neutrophils Relative %: 75 %
Platelets: 425 10*3/uL — ABNORMAL HIGH (ref 150–400)
RBC: 3.85 MIL/uL — ABNORMAL LOW (ref 3.87–5.11)
RDW: 13.4 % (ref 11.5–15.5)
WBC: 10 10*3/uL (ref 4.0–10.5)
nRBC: 0 % (ref 0.0–0.2)

## 2019-12-17 LAB — LACTIC ACID, PLASMA: Lactic Acid, Venous: 1 mmol/L (ref 0.5–1.9)

## 2019-12-17 NOTE — ED Notes (Signed)
Pt called in the [facility] with no response 

## 2019-12-17 NOTE — ED Notes (Signed)
Pt called in the [place] with no response, pt is not visualized.

## 2019-12-17 NOTE — ED Notes (Signed)
Called in the waiting room with no answer. °

## 2019-12-17 NOTE — ED Triage Notes (Signed)
Patient presents to the ED with abdominal pain and puss draining from her surgical site to her left abdomen.  Patient had hernia surgery on Tuesday 12/22.  Patient states area was closed with glue and patient did not notice any wound issues until today.

## 2019-12-18 ENCOUNTER — Telehealth: Payer: Self-pay | Admitting: Emergency Medicine

## 2019-12-18 ENCOUNTER — Encounter: Payer: Self-pay | Admitting: Intensive Care

## 2019-12-18 ENCOUNTER — Other Ambulatory Visit: Payer: Self-pay

## 2019-12-18 ENCOUNTER — Emergency Department
Admission: EM | Admit: 2019-12-18 | Discharge: 2019-12-18 | Disposition: A | Discharge status: Home / Self Care | Payer: Medicare HMO | Source: Home / Self Care | Attending: Emergency Medicine | Admitting: Emergency Medicine

## 2019-12-18 DIAGNOSIS — Z888 Allergy status to other drugs, medicaments and biological substances status: Secondary | ICD-10-CM | POA: Diagnosis not present

## 2019-12-18 DIAGNOSIS — Z4801 Encounter for change or removal of surgical wound dressing: Secondary | ICD-10-CM | POA: Diagnosis not present

## 2019-12-18 DIAGNOSIS — Z7984 Long term (current) use of oral hypoglycemic drugs: Secondary | ICD-10-CM | POA: Insufficient documentation

## 2019-12-18 DIAGNOSIS — G8918 Other acute postprocedural pain: Secondary | ICD-10-CM | POA: Insufficient documentation

## 2019-12-18 DIAGNOSIS — Z9884 Bariatric surgery status: Secondary | ICD-10-CM | POA: Diagnosis not present

## 2019-12-18 DIAGNOSIS — Z8619 Personal history of other infectious and parasitic diseases: Secondary | ICD-10-CM | POA: Insufficient documentation

## 2019-12-18 DIAGNOSIS — E785 Hyperlipidemia, unspecified: Secondary | ICD-10-CM | POA: Diagnosis not present

## 2019-12-18 DIAGNOSIS — E119 Type 2 diabetes mellitus without complications: Secondary | ICD-10-CM | POA: Insufficient documentation

## 2019-12-18 DIAGNOSIS — T8189XA Other complications of procedures, not elsewhere classified, initial encounter: Secondary | ICD-10-CM | POA: Diagnosis not present

## 2019-12-18 DIAGNOSIS — Z5189 Encounter for other specified aftercare: Secondary | ICD-10-CM

## 2019-12-18 DIAGNOSIS — I10 Essential (primary) hypertension: Secondary | ICD-10-CM | POA: Insufficient documentation

## 2019-12-18 DIAGNOSIS — Z882 Allergy status to sulfonamides status: Secondary | ICD-10-CM | POA: Diagnosis not present

## 2019-12-18 DIAGNOSIS — Y829 Unspecified medical devices associated with adverse incidents: Secondary | ICD-10-CM | POA: Diagnosis not present

## 2019-12-18 DIAGNOSIS — Z79899 Other long term (current) drug therapy: Secondary | ICD-10-CM | POA: Insufficient documentation

## 2019-12-18 NOTE — ED Provider Notes (Signed)
Dr. Dahlia Byes return message sent to him via chart, he reports that he see patient this Monday.   Delman Kitten, MD 12/18/19 Bosie Helper

## 2019-12-18 NOTE — ED Provider Notes (Signed)
[facility] Emergency Department Provider Note   ____________________________________________   First MD Initiated Contact with Patient 12/18/19 1725     (approximate)  I have reviewed the triage vital signs and the nursing notes.   HISTORY  Chief Complaint Post-op Problem    HPI Julie Acevedo is a 69 y.o. female for evaluation of drainage from her hernia surgery site  Patient reports she has noticed small amount of pus occasionally draining from her surgical site of her left upper abdomen.  The area has remained sore, but she has not had to use her prescription pain medicine frequently.  She reports that this is been present for a few days, she called surgery clinic yesterday but was closed.  Came to the ER but noticed that seem very busy so she went home.  She comes back today as she continues to see small amounts of drainage of what looks, like pus coming from the edge of her incision in the left upper abdomen  She is eating and drinking normally.  No fevers or chills.  Reports she is moving her bowels normally passing gas.  She reports the area is sore especially when she moves.  The abdomen has not been swollen.  She reports the pain is been pretty consistent since the time of her surgery   Past Medical History:  Diagnosis Date  . Anemia   . Arthritis   . COVID-19 11/19/2019  . Diverticulitis   . DM (diabetes mellitus) ([place])    typ e 2  . History of methicillin resistant staphylococcus aureus (MRSA) 2017  . HTN (hypertension)   . Hyperlipidemia   . Memory difficulties 09/01/2015  . Multiple thyroid nodules   . Myocardial infarction ([place]) 1995  . Short-term memory loss     Patient Active Problem List   Diagnosis Date Noted  . Large bowel obstruction ([place]) 04/17/2019  . Acquired trigger finger 03/20/2019  . Iron deficiency anemia 11/21/2018  . Syncope 03/18/2018  . Anemia 03/18/2018  . Exophthalmos 02/22/2018  . Multiple thyroid nodules  02/22/2018  . 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 Pain at surgical site 08/30/2016  . Ventral incisional hernia 08/21/2016  . SBO (small bowel obstruction) ([place])   . Partial small bowel obstruction ([place]) 04/23/2016  . Adjustment disorder with mixed anxiety and depressed mood 04/23/2016  . Memory difficulties 09/01/2015  . Arthritis 06/11/2015  . Back pain, chronic 06/11/2015  . HLD (hyperlipidemia) 06/11/2015  . Cannot sleep 06/11/2015  . L-S radiculopathy 06/11/2015  . Arthritis of knee, degenerative 06/11/2015  . B12 deficiency 06/11/2015  . 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 DM2 (diabetes mellitus, type 2) ([place]) 11/15/2012  . Adiposity 11/21/2009  . Avitaminosis D 11/07/2009  . Narrowing of intervertebral disc space 07/25/2009  . Benign essential HTN 07/25/2009    Past Surgical History:  Procedure Laterality Date  . ABDOMINAL HYSTERECTOMY     due to endometriosis-1 ovary left  . BREAST EXCISIONAL BIOPSY Right 1971   neg  . CARDIAC CATHETERIZATION  2000   no significant CAD per note of Dr Caryl Comes in 2013   . CHOLECYSTECTOMY    . CYSTOCELE REPAIR N/A 03/19/2017   Procedure: ANTERIOR REPAIR (CYSTOCELE);  Surgeon: Bjorn Loser, MD;  Location: [facility];  Service: Urology;  Laterality: N/A;  . CYSTOSCOPY N/A 03/19/2017   Procedure: CYSTOSCOPY;  Surgeon: Bjorn Loser, MD;  Location: [facility];  Service: Urology;  Laterality: N/A;  .  EYE SURGERY    . FOOT SURGERY    . HAND SURGERY    . HEMICOLECTOMY    . INSERTION OF MESH N/A 08/21/2016   Procedure: INSERTION OF MESH;  Surgeon: Excell Seltzer, MD;  Location: [facility];  Service: General;  Laterality: N/A;  . INSERTION OF MESH N/A 12/08/2019   Procedure: INSERTION OF MESH;  Surgeon: Jules Husbands, MD;  Location: [facility];  Service: General;  Laterality: N/A;  . LAPAROSCOPIC LYSIS OF ADHESIONS N/A 08/21/2016   Procedure: LAPAROSCOPIC LYSIS OF ADHESIONS;  Surgeon:  Excell Seltzer, MD;  Location: [facility];  Service: General;  Laterality: N/A;  . LAPAROSCOPIC REMOVAL ABDOMINAL MASS    . LAPAROTOMY N/A 04/16/2019   Procedure: EXPLORATORY LAPAROTOMY;  Surgeon: Benjamine Sprague, DO;  Location: [facility];  Service: General;  Laterality: N/A;  . LOOP RECORDER INSERTION N/A 03/21/2018   Procedure: LOOP RECORDER INSERTION;  Surgeon: Sanda Klein, MD;  Location: [place] [facility];  Service: Cardiovascular;  Laterality: N/A;  . LUMBAR [place]    . ROUX-EN-Y GASTRIC BYPASS  2010   Dr Hassell Done  . TONSILLECTOMY    . UPPER GI ENDOSCOPY  01/12/16   normal larynx, normal esophagus. gastric bypass with a normal-sized pouch and intact staple line, normal examined jejunum, otherwise exam was normal  . VENTRAL HERNIA REPAIR N/A 08/21/2016   Procedure: LAPAROSCOPIC VENTRAL HERNIA;  Surgeon: Excell Seltzer, MD;  Location: [facility];  Service: General;  Laterality: N/A;  . XI ROBOTIC ASSISTED VENTRAL HERNIA N/A 12/08/2019   Procedure: XI ROBOTIC ASSISTED VENTRAL HERNIA;  Surgeon: Jules Husbands, MD;  Location: [facility];  Service: General;  Laterality: N/A;    Prior to Admission medications   Medication Sig Start Date End Date Taking? Authorizing Provider  Vitamin D, Ergocalciferol, (DRISDOL) 1.25 MG (50000 UT) CAPS capsule TAKE 1 CAPSULE (50,000 UNITS TOTAL) BY MOUTH EVERY 7 (SEVEN) DAYS. 12/07/19   Jerrol Banana., MD  diphenhydrAMINE (BENADRYL) 50 MG tablet Take 1 tablet (50 mg total) by mouth once for 1 dose. Take 1 hour prior to your CT study Patient not taking: Reported on 11/30/2019 09/30/19 09/30/19  Jules Husbands, MD  Elastic Bandages & Supports (COMPRESSION & SUPPORT GLOVES L) [place]  08/19/18   [provider]  folic acid (FOLVITE) 1 MG tablet Take 1 tablet (1 mg total) by mouth daily. Patient not taking: Reported on 11/03/2019 03/21/18   Shelly Coss, MD  gabapentin (NEURONTIN) 100 MG capsule TAKE 2 CAPSULES (200 MG TOTAL) BY MOUTH 3 (THREE) TIMES  DAILY. Patient taking differently: Take 100 mg by mouth 3 (three) times daily.  05/25/19   Jerrol Banana., MD  hydrochlorothiazide (HYDRODIURIL) 25 MG tablet TAKE 1 TABLET (25 MG TOTAL) BY MOUTH DAILY. 10/06/19   Jerrol Banana., MD  HYDROcodone-acetaminophen (NORCO) 5-325 MG tablet Take 1 tablet by mouth every 4 (four) hours as needed for moderate pain. Patient taking differently: Take 1 tablet by mouth every 4 (four) hours as needed for moderate pain.  09/26/19   Jerrol Banana., MD  HYDROcodone-acetaminophen (NORCO/VICODIN) 5-325 MG tablet Take 1-2 tablets by mouth every 4 (four) hours as needed for moderate pain. 12/08/19   Pabon, Diego F, MD  LINZESS 290 MCG CAPS capsule TAKE 1 CAPSULE (290 MCG TOTAL) BY MOUTH DAILY BEFORE BREAKFAST. Patient taking differently: Take 290 mcg by mouth daily before breakfast.  03/11/19   Jerrol Banana., MD  meloxicam (MOBIC) 15 MG tablet Take  15 mg by mouth daily.  08/13/17   [provider]  metFORMIN (GLUCOPHAGE) 500 MG tablet TAKE 1 TABLET (500 MG TOTAL) BY MOUTH 2 (TWO) TIMES DAILY WITH A MEAL. Patient not taking: TAKE 1 TABLET (500 MG TOTAL) BY MOUTH 2 (TWO) TIMES DAILY WITH A MEAL. 12/05/19   Jerrol Banana., MD  methocarbamol (ROBAXIN) 500 MG tablet Take 500 mg by mouth 2 (two) times daily. Back pain 05/22/19   [provider]  metoprolol succinate (TOPROL-XL) 25 MG 24 hr tablet Take 0.5 tablets (12.5 mg total) by mouth daily. Patient taking differently: Take 25 mg by mouth daily.  06/24/19   Croitoru, Mihai, MD  ondansetron (ZOFRAN ODT) 4 MG disintegrating tablet Take 1 tablet (4 mg total) by mouth every 8 (eight) hours as needed for nausea or vomiting. Patient taking differently: Take 4 mg by mouth every 8 (eight) hours as needed for nausea or vomiting.  04/06/19   Harvest Dark, MD  ondansetron (ZOFRAN) 4 MG tablet Take 1 tablet (4 mg total) by mouth every 8 (eight) hours as needed for nausea or vomiting.  12/08/19 12/07/20  Pabon, Diego F, MD  temazepam (RESTORIL) 30 MG capsule Take 30 mg by mouth at bedtime.    [provider]  traMADol (ULTRAM) 50 MG tablet Take 50 mg by mouth every 6 (six) hours as needed for moderate pain.     [provider]  traZODone (DESYREL) 50 MG tablet TAKE 1 TABLET (50 MG TOTAL) BY MOUTH AT BEDTIME AS NEEDED FOR SLEEP. Patient taking differently: Take 50 mg by mouth at bedtime as needed for sleep.  08/14/19   Jerrol Banana., MD    Allergies Lotrel [amlodipine besy-benazepril hcl], Contrast media [iodinated diagnostic agents], Niacin, and Sulfa antibiotics  Family History  Problem Relation Age of Onset  . Cancer Father 61       Lung  . Coronary artery disease Father 37  . COPD Mother   . Diabetes Mother   . Hypertension Mother   . Cancer Paternal Grandmother        Breast  . Breast cancer Paternal Grandmother   . Congestive Heart Failure Maternal Grandmother   . Emphysema Maternal Grandfather   . Heart attack Paternal Grandfather   . Cancer Paternal Aunt        Breast  . Breast cancer Paternal Aunt 41  . Breast cancer Paternal Aunt 56    Social History Social History   Tobacco Use  . Smoking status: Never Smoker  . Smokeless tobacco: Never Used  Substance Use Topics  . Alcohol use: No  . Drug use: No    Review of Systems Constitutional: No fever/chills Eyes: No visual changes. ENT: No sore throat. Cardiovascular: Denies chest pain. Respiratory: Denies shortness of breath. Gastrointestinal: No abdominal pain except in the area of her incision, does report that it has been sore like this since the time of surgery, seeing some drainage from that area but no redness or swelling.   Genitourinary: Negative for dysuria. Musculoskeletal: Negative for back pain. Skin: Negative for rash except as discussed in HPI. Neurological: Negative for headaches, areas of focal weakness or numbness.     ____________________________________________   PHYSICAL EXAM:  VITAL SIGNS: ED Triage Vitals  Enc Vitals Group     BP 12/18/19 1639 140/71     Pulse Rate 12/18/19 1639 64     Resp 12/18/19 1639 16     Temp 12/18/19 1639 99 F (37.2 C)  Temp Source 12/18/19 1639 Oral     SpO2 12/18/19 1639 100 %     Weight 12/18/19 1640 133 lb (60.3 kg)     Height 12/18/19 1640 5\' 3"  (1.6 m)     Head Circumference --      Peak Flow --      Pain Score 12/18/19 1639 7     Pain Loc --      Pain Edu? --      Excl. in [place]? --     Constitutional: Alert and oriented. Well appearing and in no acute distress.  Ambulatory.  Very independent.  Very pleasant. Eyes: Conjunctivae are normal. Head: Atraumatic. Nose: No congestion/rhinnorhea. Mouth/Throat: Mucous membranes are moist. Neck: No stridor.  Cardiovascular: Normal rate, regular rhythm. Good peripheral circulation. Respiratory: Normal respiratory effort.  No retractions.  Gastrointestinal: Soft and nontender except some mild tenderness without notable swelling or induration just along the lateral border of her left upper surgical incision.  Please see media photo uploaded.  There is no surrounding erythema, I am not able to express any purulence from the area and she does not have any surrounding guarding or peritonitis. No distention. Musculoskeletal: No lower extremity tenderness nor edema. Neurologic:  Normal speech and language. No gross focal neurologic deficits are appreciated.  Skin:  Skin is warm, dry and intact. No rash noted. Psychiatric: Mood and affect are normal. Speech and behavior are normal.  ____________________________________________   LABS (all labs ordered are listed, but only abnormal results are displayed)  Labs Reviewed - No data to display ____________________________________________  EKG   ____________________________________________  RADIOLOGY   ____________________________________________   PROCEDURES   Procedure(s) performed: None  Procedures  Critical Care performed: No  ____________________________________________   INITIAL IMPRESSION / ASSESSMENT AND PLAN / ED COURSE  Pertinent labs & imaging results that were available during my care of the patient were reviewed by me and considered in my medical decision making (see chart for details).   Labs drawn yesterday reviewed, normal white count, reassuring bmp.  Normal lactic.  Afebrile normal vital signs.  No symptoms of obstruction or blockage.  She reports still eating and drinking, no fevers.  Surgical site evaluation appears reassuring at this time, does have a small amount of what appears to be slightly purulent drainage on her bandage that she has had on since this morning.  I do not however see any evidence of frank abscess, there is no evidence of surrounding cellulitis, and I will certainly discussed the case and obtain recommendations from her surgical team  Clinical Course as of Dec 17 1816  Fri Dec 18, 2019  1800 Case, clinical history, and media photo reviewed with Dr. Christian Mate. At this point, advises close follow-up. Keep area warm and dry, follow-up Monday. No antibiotics recommended at this time.   [MQ]    Clinical Course User Index [MQ] Delman Kitten, MD   Return precautions and treatment recommendations and follow-up discussed with the patient who is agreeable with the plan.   ____________________________________________   FINAL CLINICAL IMPRESSION(S) / ED DIAGNOSES  Final diagnoses:  Visit for wound check  Post-operative pain        Note:  This document was prepared using Dragon voice recognition software and may include unintentional dictation errors       Delman Kitten, MD 12/18/19 1818

## 2019-12-18 NOTE — ED Notes (Signed)
First Nurse Note: Pt to ED via POV for pus oozing from surgical site. Pt is in NAD.

## 2019-12-18 NOTE — Telephone Encounter (Signed)
Called patient due to lwot to inquire about condition and follow up plans. She says she has pus drainage from surgical wound.  Says she tried tocall the surgeon, but it says to go to ED.  Says she cant be seen until at least Monday.   I told her that I would not recommend that she wait until Monday, and that I would advise her to return here.  She agrees to return.

## 2019-12-18 NOTE — ED Triage Notes (Addendum)
Patient had hernia surgery 12/08/19 and now experiencing pus drainage from her surgical site on abdomen. Patient was triaged 12/17/2019 with blood drawn and lactic and left before being seen by EDP. Ambulatory with no problems

## 2019-12-18 NOTE — Discharge Instructions (Signed)
Please follow-up with general surgery as planned on Monday.  Return if you start seeing fevers, redness around her surgery site, swelling, vomiting, worsening of pain or symptoms, or new concerns arise.

## 2019-12-21 ENCOUNTER — Other Ambulatory Visit: Payer: Self-pay

## 2019-12-21 ENCOUNTER — Encounter: Payer: Self-pay | Admitting: Physician Assistant

## 2019-12-21 ENCOUNTER — Ambulatory Visit (INDEPENDENT_AMBULATORY_CARE_PROVIDER_SITE_OTHER): Payer: Self-pay | Admitting: Physician Assistant

## 2019-12-21 VITALS — BP 122/78 | HR 75 | Temp 97.5°F | Ht 63.0 in | Wt 139.0 lb

## 2019-12-21 DIAGNOSIS — K432 Incisional hernia without obstruction or gangrene: Secondary | ICD-10-CM

## 2019-12-21 DIAGNOSIS — Z09 Encounter for follow-up examination after completed treatment for conditions other than malignant neoplasm: Secondary | ICD-10-CM

## 2019-12-21 NOTE — Patient Instructions (Addendum)
Patient had dressing changed at today's visit by Otho Ket, PA-C.  Patient should contact our office if you notice any signs of infection such as hot to touch, inflammation, any fever or chills.   Patient is to refrain from any heavy lifting for the remainder of the total of four weeks.

## 2019-12-21 NOTE — Progress Notes (Signed)
[facility] POST-OP OFFICE VISIT  12/21/2019  HPI: Julie Acevedo is a 69 y.o. female 13 days s/p robotic assisted laparoscopic ventral hernia repair with Dr Dahlia Byes.    She has been doing well aside from an issue with her incision sites. Her LUQ incision has been draining over the last week. She was seen in the ED for this on 01/-01 but was not felt to have an infection. No fever or chills. No other complaints.   Vital signs: BP 122/78   Pulse 75   Temp (!) 97.5 F (36.4 C) (Temporal)   Ht 5\' 3"  (1.6 m)   Wt 139 lb (63 kg)   SpO2 99%   BMI 24.62 kg/m    Physical Exam: Constitutional: Well appearing female, NAD Abdomen: Soft, non-tender, non-distended, no rebound/guarding Skin: Her LUQ incision appears to have mildly dehisced on the corners,there is no erythema, no current drainage. The remaining incisions are CDI with dermabond.    Assessment/Plan: This is a 69 y.o. female 13 days s/p robotic assisted laparoscopic ventral hernia repair    - No indication for ABx, do not suspect infection  - Continue dry dressing changes prn  - reviewed wound care and infection signs/symptoms  - complete lifting restrictions  - offered follow up in 2 weeks for re-check however she prefers to follow up prn at this time.   -- Edison Simon, PA-C [facility] 12/21/2019, 11:57 AM [phone] M-F: 7am - 4pm

## 2020-01-01 ENCOUNTER — Ambulatory Visit: Payer: Medicare HMO | Admitting: Obstetrics & Gynecology

## 2020-01-06 ENCOUNTER — Encounter: Payer: Medicaid Other | Admitting: Physician Assistant

## 2020-01-27 ENCOUNTER — Telehealth: Payer: Self-pay | Admitting: Family Medicine

## 2020-01-27 NOTE — Telephone Encounter (Signed)
Copied from [place] [id number]. Topic: General - Other >> Jan 27, 2020  4:09 PM Keene Breath wrote: Reason for CRM: Patient called to ask if she should still get her vaccine on Friday, since she has had COVID recently.  She stated that she thinks she is supposed to wait 90 days but would like to verify that.  Please call to discuss at [phone]

## 2020-01-28 ENCOUNTER — Telehealth: Payer: Self-pay

## 2020-01-28 NOTE — Telephone Encounter (Signed)
Advised 

## 2020-01-28 NOTE — Telephone Encounter (Signed)
Copied from [place] [phone]. Topic: General - Inquiry >> Jan 28, 2020  9:22 AM Richardo Priest, NT wrote: Reason for CRM: Pt called in stating she would like to know if she can go get the covid-19 vaccine she is scheduled for, for tomorrow. Pt states she tested positive in December and was told its too soon for her to go. Please advise.

## 2020-01-28 NOTE — Telephone Encounter (Signed)
Patient advised to wait 90 days to get covid vaccination.

## 2020-01-30 NOTE — Progress Notes (Deleted)
[place]  [phone] [phone]) [phone]  ID: Julie Acevedo OB: Oct 17, 1951  MR#: [id number]  [id number]  Patient Care Team: Jerrol Banana., MD as PCP - General (Family Medicine) Yolonda Kida, MD as PCP - Cardiology (Cardiology) Lorelee Cover., MD as Consulting Physician (Ophthalmology) Bjorn Loser, MD as Consulting Physician (Urology) Bary Castilla Forest Gleason, MD as Consulting Physician (General Surgery)   CHIEF COMPLAINT: Iron deficiency anemia.  INTERVAL HISTORY: Patient returns to clinic today for repeat laboratory work, further evaluation, and consideration of additional IV Feraheme.  She continues to complain of being persistently cold and has chronic weakness and fatigue.  She has no neurologic complaints.  She denies any recent fevers or illnesses.  She has a fair appetite, but denies weight loss.  She has no chest pain, cough, hemoptysis, or shortness of breath.  She denies any nausea, vomiting, constipation, or diarrhea.  She denies any melena or hematochezia.  She has no urinary complaints.  Patient offers no further specific complaints today.  REVIEW OF SYSTEMS:   Review of Systems  Constitutional: Positive for malaise/fatigue. Negative for fever and weight loss.  Respiratory: Negative.  Negative for cough and shortness of breath.   Cardiovascular: Negative.  Negative for chest pain and leg swelling.  Gastrointestinal: Negative.  Negative for abdominal pain, blood in stool and melena.  Genitourinary: Negative.  Negative for hematuria.  Musculoskeletal: Negative.  Negative for back pain.  Skin: Negative.  Negative for rash.  Neurological: Positive for weakness. Negative for dizziness, focal weakness and headaches.  Psychiatric/Behavioral: Negative.  The patient is not nervous/anxious.     As per HPI. Otherwise, a complete review of systems is negative.  PAST MEDICAL HISTORY: Past Medical History:  Diagnosis Date  .  Anemia   . Arthritis   . COVID-19 11/19/2019  . Diverticulitis   . DM (diabetes mellitus) ([place])    typ e 2  . History of methicillin resistant staphylococcus aureus (MRSA) 2017  . HTN (hypertension)   . Hyperlipidemia   . Memory difficulties 09/01/2015  . Multiple thyroid nodules   . Myocardial infarction ([place]) 1995  . Short-term memory loss     PAST SURGICAL HISTORY: Past Surgical History:  Procedure Laterality Date  . ABDOMINAL HYSTERECTOMY     due to endometriosis-1 ovary left  . BREAST EXCISIONAL BIOPSY Right 1971   neg  . CARDIAC CATHETERIZATION  2000   no significant CAD per note of Dr Caryl Comes in 2013   . CHOLECYSTECTOMY    . CYSTOCELE REPAIR N/A 03/19/2017   Procedure: ANTERIOR REPAIR (CYSTOCELE);  Surgeon: Bjorn Loser, MD;  Location: [facility];  Service: Urology;  Laterality: N/A;  . CYSTOSCOPY N/A 03/19/2017   Procedure: CYSTOSCOPY;  Surgeon: Bjorn Loser, MD;  Location: [facility];  Service: Urology;  Laterality: N/A;  . EYE SURGERY    . FOOT SURGERY    . HAND SURGERY    . HEMICOLECTOMY    . INSERTION OF MESH N/A 08/21/2016   Procedure: INSERTION OF MESH;  Surgeon: Excell Seltzer, MD;  Location: [facility];  Service: General;  Laterality: N/A;  . INSERTION OF MESH N/A 12/08/2019   Procedure: INSERTION OF MESH;  Surgeon: Jules Husbands, MD;  Location: [facility];  Service: General;  Laterality: N/A;  . LAPAROSCOPIC LYSIS OF ADHESIONS N/A 08/21/2016   Procedure: LAPAROSCOPIC LYSIS OF ADHESIONS;  Surgeon: Excell Seltzer, MD;  [facility];  Service: General;  Laterality: N/A;  . LAPAROSCOPIC REMOVAL ABDOMINAL MASS    .  LAPAROTOMY N/A 04/16/2019   Procedure: EXPLORATORY LAPAROTOMY;  Surgeon: Benjamine Sprague, DO;  Location: [facility];  Service: General;  Laterality: N/A;  . LOOP RECORDER INSERTION N/A 03/21/2018   Procedure: LOOP RECORDER INSERTION;  Surgeon: Sanda Klein, MD;  Location: [facility];  Service: Cardiovascular;  Laterality: N/A;  . LUMBAR [place]      . ROUX-EN-Y GASTRIC BYPASS  2010   Dr Hassell Done  . TONSILLECTOMY    . UPPER GI ENDOSCOPY  01/12/16   normal larynx, normal esophagus. gastric bypass with a normal-sized pouch and intact staple line, normal examined jejunum, otherwise exam was normal  . VENTRAL HERNIA REPAIR N/A 08/21/2016   Procedure: LAPAROSCOPIC VENTRAL HERNIA;  Surgeon: Excell Seltzer, MD;  [facility];  Service: General;  Laterality: N/A;  . XI ROBOTIC ASSISTED VENTRAL HERNIA N/A 12/08/2019   Procedure: XI ROBOTIC ASSISTED VENTRAL HERNIA;  Surgeon: Jules Husbands, MD;  Location: [facility];  Service: General;  Laterality: N/A;    FAMILY HISTORY: Family History  Problem Relation Age of Onset  . Cancer Father 60       Lung  . Coronary artery disease Father 41  . COPD Mother   . Diabetes Mother   . Hypertension Mother   . Cancer Paternal Grandmother        Breast  . Breast cancer Paternal Grandmother   . Congestive Heart Failure Maternal Grandmother   . Emphysema Maternal Grandfather   . Heart attack Paternal Grandfather   . Cancer Paternal Aunt        Breast  . Breast cancer Paternal Aunt 65  . Breast cancer Paternal Aunt 21    ADVANCED DIRECTIVES (Y/N):  N  HEALTH MAINTENANCE: Social History   Tobacco Use  . Smoking status: Never Smoker  . Smokeless tobacco: Never Used  Substance Use Topics  . Alcohol use: No  . Drug use: No     Colonoscopy:  PAP:  Bone density:  Lipid panel:  Allergies  Allergen Reactions  . Lotrel [Amlodipine Besy-Benazepril Hcl] Anaphylaxis  . Contrast Media [Iodinated Diagnostic Agents] Swelling and Other (See Comments)    Reaction:  Neck swelling   . Niacin Hives  . Sulfa Antibiotics Hives    Current Outpatient Medications  Medication Sig Dispense Refill  . diphenhydrAMINE (BENADRYL) 50 MG tablet Take 1 tablet (50 mg total) by mouth once for 1 dose. Take 1 hour prior to your CT study (Patient not taking: Reported on 11/30/2019) 1 tablet 0  . Elastic Bandages &  Supports (COMPRESSION & SUPPORT GLOVES L) MISC     . folic acid (FOLVITE) 1 MG tablet Take 1 tablet (1 mg total) by mouth daily. (Patient not taking: Reported on 11/03/2019) 30 tablet 0  . gabapentin (NEURONTIN) 100 MG capsule TAKE 2 CAPSULES (200 MG TOTAL) BY MOUTH 3 (THREE) TIMES DAILY. (Patient taking differently: Take 100 mg by mouth 3 (three) times daily. ) 540 capsule 1  . hydrochlorothiazide (HYDRODIURIL) 25 MG tablet TAKE 1 TABLET (25 MG TOTAL) BY MOUTH DAILY. 90 tablet 3  . HYDROcodone-acetaminophen (NORCO) 5-325 MG tablet Take 1 tablet by mouth every 4 (four) hours as needed for moderate pain. (Patient not taking: Reported on 12/21/2019) 25 tablet 0  . HYDROcodone-acetaminophen (NORCO/VICODIN) 5-325 MG tablet Take 1-2 tablets by mouth every 4 (four) hours as needed for moderate pain. (Patient not taking: Reported on 12/21/2019) 20 tablet 0  . LINZESS 290 MCG CAPS capsule TAKE 1 CAPSULE (290 MCG TOTAL) BY MOUTH DAILY  BEFORE BREAKFAST. (Patient taking differently: Take 290 mcg by mouth daily before breakfast. ) 90 capsule 3  . meloxicam (MOBIC) 15 MG tablet Take 15 mg by mouth daily.     . metFORMIN (GLUCOPHAGE) 500 MG tablet TAKE 1 TABLET (500 MG TOTAL) BY MOUTH 2 (TWO) TIMES DAILY WITH A MEAL. 180 tablet 3  . methocarbamol (ROBAXIN) 500 MG tablet Take 500 mg by mouth 2 (two) times daily. Back pain    . metoprolol succinate (TOPROL-XL) 25 MG 24 hr tablet Take 0.5 tablets (12.5 mg total) by mouth daily. (Patient taking differently: Take 25 mg by mouth daily. )    . ondansetron (ZOFRAN ODT) 4 MG disintegrating tablet Take 1 tablet (4 mg total) by mouth every 8 (eight) hours as needed for nausea or vomiting. (Patient taking differently: Take 4 mg by mouth every 8 (eight) hours as needed for nausea or vomiting. ) 20 tablet 0  . ondansetron (ZOFRAN) 4 MG tablet Take 1 tablet (4 mg total) by mouth every 8 (eight) hours as needed for nausea or vomiting. (Patient not taking: Reported on 12/21/2019) 20 tablet  1  . temazepam (RESTORIL) 30 MG capsule Take 30 mg by mouth at bedtime.    . traMADol (ULTRAM) 50 MG tablet Take 50 mg by mouth every 6 (six) hours as needed for moderate pain.     . traZODone (DESYREL) 50 MG tablet TAKE 1 TABLET (50 MG TOTAL) BY MOUTH AT BEDTIME AS NEEDED FOR SLEEP. (Patient taking differently: Take 50 mg by mouth at bedtime as needed for sleep. ) 90 tablet 3  . Vitamin D, Ergocalciferol, (DRISDOL) 1.25 MG (50000 UT) CAPS capsule TAKE 1 CAPSULE (50,000 UNITS TOTAL) BY MOUTH EVERY 7 (SEVEN) DAYS. 12 capsule 1   No current facility-administered medications for this visit.    OBJECTIVE: There were no vitals filed for this visit.   There is no height or weight on file to calculate BMI.    ECOG FS:0 - Asymptomatic  General: Well-developed, well-nourished, no acute distress. Eyes: Pink conjunctiva, anicteric sclera. HEENT: Normocephalic, moist mucous membranes. Lungs: Clear to auscultation bilaterally. Heart: Regular rate and rhythm. No rubs, murmurs, or gallops. Abdomen: Soft, nontender, nondistended. No organomegaly noted, normoactive bowel sounds. Musculoskeletal: No edema, cyanosis, or clubbing. Neuro: Alert, answering all questions appropriately. Cranial nerves grossly intact. Skin: No rashes or petechiae noted. Psych: Normal affect.  LAB RESULTS:  Lab Results  Component Value Date   NA 142 12/17/2019   K 3.4 (L) 12/17/2019   CL 106 12/17/2019   CO2 28 12/17/2019   GLUCOSE 89 12/17/2019   BUN 17 12/17/2019   CREATININE 1.46 (H) 12/17/2019   CALCIUM 9.2 12/17/2019   PROT 7.9 12/17/2019   ALBUMIN 3.6 12/17/2019   AST 14 (L) 12/17/2019   ALT 7 12/17/2019   ALKPHOS 115 12/17/2019   BILITOT 0.4 12/17/2019   GFRNONAA 37 (L) 12/17/2019   GFRAA 43 (L) 12/17/2019    Lab Results  Component Value Date   WBC 10.0 12/17/2019   NEUTROABS 7.4 12/17/2019   HGB 10.7 (L) 12/17/2019   HCT 34.7 (L) 12/17/2019   MCV 90.1 12/17/2019   PLT 425 (H) 12/17/2019    Lab  Results  Component Value Date   IRON 38 08/04/2019   TIBC 249 (L) 08/04/2019   IRONPCTSAT 15 08/04/2019   Lab Results  Component Value Date   FERRITIN 70 08/04/2019    STUDIES: No results found.  ASSESSMENT: Iron deficiency anemia.  PLAN:   1.  Iron deficiency anemia: Patient's hemoglobin has trended down slightly to 10.1 and she is symptomatic.  Iron stores are pending at time of dictation.  Previously, all of her other laboratory work including hemolysis labs, B12, folate, and SPEP were negative or within normal limits.  Patient had a colonoscopy on September 05, 2018 that was reported as normal.  We will proceed with one infusion of 510 mg IV Feraheme today.  She does not require second dose.  Return to clinic in 4 months with repeat laboratory work, further evaluation, and continuation of treatment if necessary.    I spent a total of 30 minutes face-to-face with the patient of which greater than 50% of the visit was spent in counseling and coordination of care as detailed above.   Patient expressed understanding and was in agreement with this plan. She also understands that She can call clinic at any time with any questions, concerns, or complaints.    Lloyd Huger, MD   01/30/2020 10:50 AM

## 2020-02-04 ENCOUNTER — Inpatient Hospital Stay: Payer: Medicare HMO

## 2020-02-04 ENCOUNTER — Inpatient Hospital Stay: Payer: Medicare HMO | Admitting: Oncology

## 2020-02-04 NOTE — Progress Notes (Signed)
[place]  [phone]) [phone] Fax:(336) [phone]  ID: [name] OB: June 08, 1951  MR#: [id number]  [id number]  Patient Care Team: Jerrol Banana., MD as PCP - General (Family Medicine) Yolonda Kida, MD as PCP - Cardiology (Cardiology) Lorelee Cover., MD as Consulting Physician (Ophthalmology) Bjorn Loser, MD as Consulting Physician (Urology) Bary Castilla, Forest Gleason, MD as Consulting Physician (General Surgery) Jules Husbands, MD as Consulting Physician (General Surgery)   CHIEF COMPLAINT: Iron deficiency anemia.  INTERVAL HISTORY: Patient returns to clinic today for repeat laboratory work, further evaluation, consideration of additional IV Feraheme.  She has chronic weakness and fatigue which has been slightly worse over the past several weeks, but otherwise feels well. She has no neurologic complaints.  She denies any recent fevers or illnesses.  She has a fair appetite, but denies weight loss.  She has no chest pain, cough, hemoptysis, or shortness of breath.  She denies any nausea, vomiting, constipation, or diarrhea.  She denies any melena or hematochezia.  She has no urinary complaints.  Patient offers no further specific complaints today.  REVIEW OF SYSTEMS:   Review of Systems  Constitutional: Positive for malaise/fatigue. Negative for fever and weight loss.  Respiratory: Negative.  Negative for cough and shortness of breath.   Cardiovascular: Negative.  Negative for chest pain and leg swelling.  Gastrointestinal: Negative.  Negative for abdominal pain, blood in stool and melena.  Genitourinary: Negative.  Negative for hematuria.  Musculoskeletal: Negative.  Negative for back pain.  Skin: Negative.  Negative for rash.  Neurological: Positive for weakness. Negative for dizziness, focal weakness and headaches.  Psychiatric/Behavioral: Negative.  The patient is not nervous/anxious.     As per HPI. Otherwise, a complete review of  systems is negative.  PAST MEDICAL HISTORY: Past Medical History:  Diagnosis Date  . Anemia   . Arthritis   . COVID-19 11/19/2019  . Diverticulitis   . DM (diabetes mellitus) ([place])    typ e 2  . History of methicillin resistant staphylococcus aureus (MRSA) 2017  . HTN (hypertension)   . Hyperlipidemia   . Memory difficulties 09/01/2015  . Multiple thyroid nodules   . Myocardial infarction ([place]) 1995  . Short-term memory loss     PAST SURGICAL HISTORY: Past Surgical History:  Procedure Laterality Date  . ABDOMINAL HYSTERECTOMY     due to endometriosis-1 ovary left  . BREAST EXCISIONAL BIOPSY Right 1971   neg  . CARDIAC CATHETERIZATION  2000   no significant CAD per note of Dr Caryl Comes in 2013   . CHOLECYSTECTOMY    . CYSTOCELE REPAIR N/A 03/19/2017   Procedure: ANTERIOR REPAIR (CYSTOCELE);  Surgeon: Bjorn Loser, MD;  [facility];  Service: Urology;  Laterality: N/A;  . CYSTOSCOPY N/A 03/19/2017   Procedure: CYSTOSCOPY;  Surgeon: Bjorn Loser, MD;  [facility];  Service: Urology;  Laterality: N/A;  . EYE SURGERY    . FOOT SURGERY    . HAND SURGERY    . HEMICOLECTOMY    . INSERTION OF MESH N/A 08/21/2016   Procedure: INSERTION OF MESH;  Surgeon: Excell Seltzer, MD;  [facility];  Service: General;  Laterality: N/A;  . INSERTION OF MESH N/A 12/08/2019   Procedure: INSERTION OF MESH;  Surgeon: Jules Husbands, MD;  Location: [facility];  Service: General;  Laterality: N/A;  . LAPAROSCOPIC LYSIS OF ADHESIONS N/A 08/21/2016   Procedure: LAPAROSCOPIC LYSIS OF ADHESIONS;  Surgeon: Excell Seltzer, MD;  [facility];  Service: General;  Laterality: N/A;  . LAPAROSCOPIC REMOVAL ABDOMINAL MASS    . LAPAROTOMY N/A 04/16/2019   Procedure: EXPLORATORY LAPAROTOMY;  Surgeon: Benjamine Sprague, DO;  Location: [facility];  Service: General;  Laterality: N/A;  . LOOP RECORDER INSERTION N/A 03/21/2018   Procedure: LOOP RECORDER INSERTION;  Surgeon: Sanda Klein, MD;  Location:  [place] [facility];  Service: Cardiovascular;  Laterality: N/A;  . LUMBAR [place]    . ROUX-EN-Y GASTRIC BYPASS  2010   Dr Hassell Done  . TONSILLECTOMY    . UPPER GI ENDOSCOPY  01/12/16   normal larynx, normal esophagus. gastric bypass with a normal-sized pouch and intact staple line, normal examined jejunum, otherwise exam was normal  . VENTRAL HERNIA REPAIR N/A 08/21/2016   Procedure: LAPAROSCOPIC VENTRAL HERNIA;  Surgeon: Excell Seltzer, MD;  [facility];  Service: General;  Laterality: N/A;  . XI ROBOTIC ASSISTED VENTRAL HERNIA N/A 12/08/2019   Procedure: XI ROBOTIC ASSISTED VENTRAL HERNIA;  Surgeon: Jules Husbands, MD;  Location: [facility];  Service: General;  Laterality: N/A;    FAMILY HISTORY: Family History  Problem Relation Age of Onset  . Cancer Father 27       Lung  . Coronary artery disease Father 40  . COPD Mother   . Diabetes Mother   . Hypertension Mother   . Cancer Paternal Grandmother        Breast  . Breast cancer Paternal Grandmother   . Congestive Heart Failure Maternal Grandmother   . Emphysema Maternal Grandfather   . Heart attack Paternal Grandfather   . Cancer Paternal Aunt        Breast  . Breast cancer Paternal Aunt 48  . Breast cancer Paternal Aunt 35    ADVANCED DIRECTIVES (Y/N):  N  HEALTH MAINTENANCE: Social History   Tobacco Use  . Smoking status: Never Smoker  . Smokeless tobacco: Never Used  Substance Use Topics  . Alcohol use: No  . Drug use: No     Colonoscopy:  PAP:  Bone density:  Lipid panel:  Allergies  Allergen Reactions  . Lotrel [Amlodipine Besy-Benazepril Hcl] Anaphylaxis  . Contrast Media [Iodinated Diagnostic Agents] Swelling and Other (See Comments)    Reaction:  Neck swelling   . Niacin Hives  . Sulfa Antibiotics Hives    Current Outpatient Medications  Medication Sig Dispense Refill  . gabapentin (NEURONTIN) 100 MG capsule TAKE 2 CAPSULES (200 MG TOTAL) BY MOUTH 3 (THREE) TIMES DAILY. (Patient taking  differently: Take 200 mg by mouth 2 (two) times daily. ) 540 capsule 1  . hydrochlorothiazide (HYDRODIURIL) 25 MG tablet Take 1 tablet (25 mg total) by mouth daily. 90 tablet 3  . HYDROcodone-acetaminophen (NORCO/VICODIN) 5-325 MG tablet Take 1-2 tablets by mouth every 4 (four) hours as needed for moderate pain. 20 tablet 0  . LINZESS 290 MCG CAPS capsule TAKE 1 CAPSULE (290 MCG TOTAL) BY MOUTH DAILY BEFORE BREAKFAST. (Patient taking differently: Take 290 mcg by mouth daily before breakfast. ) 90 capsule 3  . meloxicam (MOBIC) 15 MG tablet Take 15 mg by mouth daily.     . metFORMIN (GLUCOPHAGE) 500 MG tablet TAKE 1 TABLET (500 MG TOTAL) BY MOUTH 2 (TWO) TIMES DAILY WITH A MEAL. (Patient taking differently: Take 500 mg by mouth daily with breakfast. ) 180 tablet 3  . methocarbamol (ROBAXIN) 500 MG tablet Take 500 mg by mouth 2 (two) times daily. Back pain    . metoprolol succinate (TOPROL-XL) 25 MG 24 hr  tablet Take 0.5 tablets (12.5 mg total) by mouth daily. (Patient taking differently: Take 25 mg by mouth daily. )    . ondansetron (ZOFRAN ODT) 4 MG disintegrating tablet Take 1 tablet (4 mg total) by mouth every 8 (eight) hours as needed for nausea or vomiting. (Patient taking differently: Take 4 mg by mouth every 8 (eight) hours as needed for nausea or vomiting. ) 20 tablet 0  . traZODone (DESYREL) 50 MG tablet TAKE 1 TABLET (50 MG TOTAL) BY MOUTH AT BEDTIME AS NEEDED FOR SLEEP. (Patient taking differently: Take 50 mg by mouth at bedtime as needed for sleep. ) 90 tablet 3  . Vitamin D, Ergocalciferol, (DRISDOL) 1.25 MG (50000 UT) CAPS capsule TAKE 1 CAPSULE (50,000 UNITS TOTAL) BY MOUTH EVERY 7 (SEVEN) DAYS. 12 capsule 1   No current facility-administered medications for this visit.    OBJECTIVE: Vitals:   02/10/20 1040  BP: 134/68  Pulse: (!) 52  Resp: 16  Temp: 98.5 F (36.9 C)  SpO2: 100%     Body mass index is 25.1 kg/m.    ECOG FS:0 - Asymptomatic  General: Well-developed,  well-nourished, no acute distress. Eyes: Pink conjunctiva, anicteric sclera. HEENT: Normocephalic, moist mucous membranes. Lungs: No audible wheezing or coughing. Heart: Regular rate and rhythm. Abdomen: Soft, nontender, no obvious distention. Musculoskeletal: No edema, cyanosis, or clubbing. Neuro: Alert, answering all questions appropriately. Cranial nerves grossly intact. Skin: No rashes or petechiae noted. Psych: Normal affect.   LAB RESULTS:  Lab Results  Component Value Date   NA 142 12/17/2019   K 3.4 (L) 12/17/2019   CL 106 12/17/2019   CO2 28 12/17/2019   GLUCOSE 89 12/17/2019   BUN 17 12/17/2019   CREATININE 1.46 (H) 12/17/2019   CALCIUM 9.2 12/17/2019   PROT 7.9 12/17/2019   ALBUMIN 3.6 12/17/2019   AST 14 (L) 12/17/2019   ALT 7 12/17/2019   ALKPHOS 115 12/17/2019   BILITOT 0.4 12/17/2019   GFRNONAA 37 (L) 12/17/2019   GFRAA 43 (L) 12/17/2019    Lab Results  Component Value Date   WBC 6.2 02/10/2020   NEUTROABS 4.0 02/10/2020   HGB 10.0 (L) 02/10/2020   HCT 32.3 (L) 02/10/2020   MCV 93.4 02/10/2020   PLT 331 02/10/2020    Lab Results  Component Value Date   IRON 54 02/10/2020   TIBC 207 (L) 02/10/2020   IRONPCTSAT 26 02/10/2020   Lab Results  Component Value Date   FERRITIN 126 02/10/2020    STUDIES: CUP PACEART REMOTE DEVICE CHECK  Result Date: 02/11/2020 [facility] summary report received. Battery status OK. Normal device function. No new symptom episodes, tachy episodes, brady, or pause episodes. No new AF episodes. Monthly summary reports and ROV/PRN Kathy Breach, RN, CCDS, [employer]   ASSESSMENT: Iron deficiency anemia.  PLAN:   1.  Iron deficiency anemia: Patient's hemoglobin has trended down and is now 10.0.  Iron stores continue to be within normal limits. Previously, all of her other laboratory work including hemolysis labs, B12, folate, and SPEP were negative or within normal limits.  Patient had a colonoscopy on September 05, 2018 that was reported as normal.  Proceed with 1 infusion of 510 mg IV Feraheme today.  She does not require second infusion.  Return to clinic in 4 months with repeat laboratory work, further evaluation, and continuation of treatment if needed.  I spent a total of 30 minutes reviewing chart data, face-to-face evaluation with the patient, counseling and coordination  of care as detailed above.   Patient expressed understanding and was in agreement with this plan. She also understands that She can call clinic at any time with any questions, concerns, or complaints.    Lloyd Huger, MD   02/12/2020 6:41 AM

## 2020-02-04 NOTE — Progress Notes (Signed)
Subjective:   Julie Acevedo is a 69 y.o. female who presents for Medicare Annual (Subsequent) preventive examination.    This visit is being conducted through telemedicine due to the COVID-19 pandemic. This patient has given me verbal consent via doximity to conduct this visit, patient states they are participating from their home address. Some vital signs may be absent or patient reported.    Patient identification: identified by name, DOB, and current address  Review of Systems:  N/A  Cardiac Risk Factors include: advanced age (>56men, >19 women);diabetes mellitus;dyslipidemia;hypertension     Objective:     Vitals: There were no vitals taken for this visit.  There is no height or weight on file to calculate BMI. Unable to obtain vitals due to visit being conducted via telephonically.   Advanced Directives 02/08/2020 12/18/2019 12/17/2019 11/30/2019 09/19/2019 04/17/2019 04/16/2019  Does Patient Have a Medical Advance Directive? No No No No No No No  Does patient want to make changes to medical advance directive? No - Patient declined - - - - - -  Would patient like information on creating a medical advance directive? - No - Patient declined No - Patient declined - - No - Patient declined -  Pre-existing out of facility DNR order (yellow form or pink MOST form) - - - - - - -    Tobacco Social History   Tobacco Use  Smoking Status Never Smoker  Smokeless Tobacco Never Used     Counseling given: Not Answered   Clinical Intake:  Pre-visit preparation completed: Yes  Pain : 0-10 Pain Score: 7  Pain Type: Chronic pain Pain Location: Back Pain Orientation: Right, Lower Pain Descriptors / Indicators: Aching Pain Frequency: Constant     Nutritional Risks: Nausea/ vomitting/ diarrhea(Nausea occasionally when takes pain medication.) Diabetes: Yes  How often do you need to have someone help you when you read instructions, pamphlets, or other written materials from your  doctor or pharmacy?: 1 - Never   Diabetes:  Is the patient diabetic?  Yes  If diabetic, was a CBG obtained today?  No  Did the patient bring in their glucometer from home?  No  How often do you monitor your CBG's? Twice a day.   Financial Strains and Diabetes Management:  Are you having any financial strains with the device, your supplies or your medication? No .  Does the patient want to be seen by Chronic Care Management for management of their diabetes?  No  Would the patient like to be referred to a Nutritionist or for Diabetic Management?  No   Diabetic Exams:  Diabetic Eye Exam: Completed 12/2019 per pt. Requested records to be faxed to clinic.   Diabetic Foot Exam: Completed 11/18/17. Pt has been advised about the importance in completing this exam. Note made to follow up on this at next in office apt.     Interpreter Needed?: No  Information entered by :: [name], LPN  Past Medical History:  Diagnosis Date  . Anemia   . Arthritis   . COVID-19 11/19/2019  . Diverticulitis   . DM (diabetes mellitus) ([place])    typ e 2  . History of methicillin resistant staphylococcus aureus (MRSA) 2017  . HTN (hypertension)   . Hyperlipidemia   . Memory difficulties 09/01/2015  . Multiple thyroid nodules   . Myocardial infarction ([place]) 1995  . Short-term memory loss    Past Surgical History:  Procedure Laterality Date  . ABDOMINAL HYSTERECTOMY     due  to endometriosis-1 ovary left  . BREAST EXCISIONAL BIOPSY Right 1971   neg  . CARDIAC CATHETERIZATION  2000   no significant CAD per note of Dr Caryl Comes in 2013   . CHOLECYSTECTOMY    . CYSTOCELE REPAIR N/A 03/19/2017   Procedure: ANTERIOR REPAIR (CYSTOCELE);  Surgeon: Bjorn Loser, MD;  Location: [facility];  Service: Urology;  Laterality: N/A;  . CYSTOSCOPY N/A 03/19/2017   Procedure: CYSTOSCOPY;  Surgeon: Bjorn Loser, MD;  Location: [facility];  Service: Urology;  Laterality: N/A;  . EYE SURGERY    . FOOT SURGERY    . HAND  SURGERY    . HEMICOLECTOMY    . INSERTION OF MESH N/A 08/21/2016   Procedure: INSERTION OF MESH;  Surgeon: Excell Seltzer, MD;  Location: [facility];  Service: General;  Laterality: N/A;  . INSERTION OF MESH N/A 12/08/2019   Procedure: INSERTION OF MESH;  Surgeon: Jules Husbands, MD;  Location: [facility];  Service: General;  Laterality: N/A;  . LAPAROSCOPIC LYSIS OF ADHESIONS N/A 08/21/2016   Procedure: LAPAROSCOPIC LYSIS OF ADHESIONS;  Surgeon: Excell Seltzer, MD;  Location: [facility];  Service: General;  Laterality: N/A;  . LAPAROSCOPIC REMOVAL ABDOMINAL MASS    . LAPAROTOMY N/A 04/16/2019   Procedure: EXPLORATORY LAPAROTOMY;  Surgeon: Benjamine Sprague, DO;  Location: [facility];  Service: General;  Laterality: N/A;  . LOOP RECORDER INSERTION N/A 03/21/2018   Procedure: LOOP RECORDER INSERTION;  Surgeon: Sanda Klein, MD;  Location: [facility];  Service: Cardiovascular;  Laterality: N/A;  . LUMBAR [place]    . ROUX-EN-Y GASTRIC BYPASS  2010   Dr Hassell Done  . TONSILLECTOMY    . UPPER GI ENDOSCOPY  01/12/16   normal larynx, normal esophagus. gastric bypass with a normal-sized pouch and intact staple line, normal examined jejunum, otherwise exam was normal  . VENTRAL HERNIA REPAIR N/A 08/21/2016   Procedure: LAPAROSCOPIC VENTRAL HERNIA;  Surgeon: Excell Seltzer, MD;  Location: [facility];  Service: General;  Laterality: N/A;  . XI ROBOTIC ASSISTED VENTRAL HERNIA N/A 12/08/2019   Procedure: XI ROBOTIC ASSISTED VENTRAL HERNIA;  Surgeon: Jules Husbands, MD;  Location: [facility];  Service: General;  Laterality: N/A;   Family History  Problem Relation Age of Onset  . Cancer Father 58       Lung  . Coronary artery disease Father 10  . COPD Mother   . Diabetes Mother   . Hypertension Mother   . Cancer Paternal Grandmother        Breast  . Breast cancer Paternal Grandmother   . Congestive Heart Failure Maternal Grandmother   . Emphysema Maternal Grandfather   . Heart attack Paternal Grandfather    . Cancer Paternal Aunt        Breast  . Breast cancer Paternal Aunt 93  . Breast cancer Paternal Aunt 43   Social History   Socioeconomic History  . Marital status: Married    Spouse name: Not on file  . Number of children: 3  . Years of education: 60  . Highest education level: Some college, no degree  Occupational History  . Occupation: IT consultant: RETIRED  Tobacco Use  . Smoking status: Never Smoker  . Smokeless tobacco: Never Used  Substance and Sexual Activity  . Alcohol use: No  . Drug use: No  . Sexual activity: Never  Other Topics Concern  . Not on file  Social History Narrative   Lives with husband, daughter and  granddaughter.;   Patient drinks about 3 cups of caffeine daily.   Patient is left handed.    Social Determinants of Health   Financial Resource Strain: Low Risk   . Difficulty of Paying Living Expenses: Not hard at all  Food Insecurity: No Food Insecurity  . Worried About Charity fundraiser in the Last Year: Never true  . Ran Out of Food in the Last Year: Never true  Transportation Needs: No Transportation Needs  . Lack of Transportation (Medical): No  . Lack of Transportation (Non-Medical): No  Physical Activity: Inactive  . Days of Exercise per Week: 0 days  . Minutes of Exercise per Session: 0 min  Stress: No Stress Concern Present  . Feeling of Stress : Only a little  Social Connections: Slightly Isolated  . Frequency of Communication with Friends and Family: More than three times a week  . Frequency of Social Gatherings with Friends and Family: More than three times a week  . Attends Religious Services: More than 4 times per year  . Active Member of Clubs or Organizations: No  . Attends Archivist Meetings: Never  . Marital Status: Married    Outpatient Encounter Medications as of 02/08/2020  Medication Sig  . Elastic Bandages & Supports (COMPRESSION & SUPPORT GLOVES L) MISC   . gabapentin (NEURONTIN) 100 MG capsule  TAKE 2 CAPSULES (200 MG TOTAL) BY MOUTH 3 (THREE) TIMES DAILY. (Patient taking differently: Take 200 mg by mouth 2 (two) times daily. )  . HYDROcodone-acetaminophen (NORCO/VICODIN) 5-325 MG tablet Take 1-2 tablets by mouth every 4 (four) hours as needed for moderate pain.  Marland Kitchen LINZESS 290 MCG CAPS capsule TAKE 1 CAPSULE (290 MCG TOTAL) BY MOUTH DAILY BEFORE BREAKFAST. (Patient taking differently: Take 290 mcg by mouth daily before breakfast. )  . meloxicam (MOBIC) 15 MG tablet Take 15 mg by mouth daily.   . metFORMIN (GLUCOPHAGE) 500 MG tablet TAKE 1 TABLET (500 MG TOTAL) BY MOUTH 2 (TWO) TIMES DAILY WITH A MEAL. (Patient taking differently: Take 500 mg by mouth daily with breakfast. )  . methocarbamol (ROBAXIN) 500 MG tablet Take 500 mg by mouth 2 (two) times daily. Back pain  . metoprolol succinate (TOPROL-XL) 25 MG 24 hr tablet Take 0.5 tablets (12.5 mg total) by mouth daily. (Patient taking differently: Take 25 mg by mouth daily. )  . ondansetron (ZOFRAN ODT) 4 MG disintegrating tablet Take 1 tablet (4 mg total) by mouth every 8 (eight) hours as needed for nausea or vomiting. (Patient taking differently: Take 4 mg by mouth every 8 (eight) hours as needed for nausea or vomiting. )  . traZODone (DESYREL) 50 MG tablet TAKE 1 TABLET (50 MG TOTAL) BY MOUTH AT BEDTIME AS NEEDED FOR SLEEP. (Patient taking differently: Take 50 mg by mouth at bedtime as needed for sleep. )  . Vitamin D, Ergocalciferol, (DRISDOL) 1.25 MG (50000 UT) CAPS capsule TAKE 1 CAPSULE (50,000 UNITS TOTAL) BY MOUTH EVERY 7 (SEVEN) DAYS.  Marland Kitchen diphenhydrAMINE (BENADRYL) 50 MG tablet Take 1 tablet (50 mg total) by mouth once for 1 dose. Take 1 hour prior to your CT study (Patient not taking: Reported on 11/30/2019)  . folic acid (FOLVITE) 1 MG tablet Take 1 tablet (1 mg total) by mouth daily. (Patient not taking: Reported on 11/03/2019)  . hydrochlorothiazide (HYDRODIURIL) 25 MG tablet TAKE 1 TABLET (25 MG TOTAL) BY MOUTH DAILY. (Patient not  taking: Reported on 02/08/2020)  . HYDROcodone-acetaminophen (NORCO) 5-325 MG tablet Take 1 tablet  by mouth every 4 (four) hours as needed for moderate pain. (Patient not taking: Reported on 02/08/2020)  . ondansetron (ZOFRAN) 4 MG tablet Take 1 tablet (4 mg total) by mouth every 8 (eight) hours as needed for nausea or vomiting. (Patient not taking: Reported on 12/21/2019)  . temazepam (RESTORIL) 30 MG capsule Take 30 mg by mouth at bedtime.  . traMADol (ULTRAM) 50 MG tablet Take 50 mg by mouth every 6 (six) hours as needed for moderate pain.    No facility-administered encounter medications on file as of 02/08/2020.    Activities of Daily Living In your present state of health, do you have any difficulty performing the following activities: 02/08/2020 11/30/2019  Hearing? N N  Vision? N N  Difficulty concentrating or making decisions? Y N  Comment Pt has seen a neurologist in the past for this and plans to schedule an apt to follow up on condition worsening. -  Walking or climbing stairs? Y N  Comment Due to back/hip pain. -  Dressing or bathing? N N  Doing errands, shopping? N N  Preparing Food and eating ? N -  Using the Toilet? N -  In the past six months, have you accidently leaked urine? N -  Do you have problems with loss of bowel control? N -  Managing your Medications? N -  Managing your Finances? N -  Housekeeping or managing your Housekeeping? N -  Some recent data might be hidden    Patient Care Team: Jerrol Banana., MD as PCP - General (Family Medicine) Yolonda Kida, MD as PCP - Cardiology (Cardiology) Lorelee Cover., MD as Consulting Physician (Ophthalmology) Bjorn Loser, MD as Consulting Physician (Urology) Bary Castilla, Forest Gleason, MD as Consulting Physician (General Surgery) Jules Husbands, MD as Consulting Physician (General Surgery)    Assessment:   This is a routine wellness examination for [employer].  Exercise Activities and Dietary  recommendations Current Exercise Habits: The patient does not participate in regular exercise at present, Exercise limited by: orthopedic condition(s)  Goals    . LIFESTYLE - DECREASE FALLS RISK     Recommend to remove any items from the home that may cause slips or trips.       Fall Risk: Fall Risk  02/08/2020 12/21/2019 10/19/2019 09/30/2019 09/28/2019  Falls in the past year? 1 0 0 0 0  Number falls in past yr: 1 0 - 0 0  Injury with Fall? 0 0 - 0 0  Risk for fall due to : Impaired balance/gait - - - -  Follow up Falls prevention discussed - - - -    FALL RISK PREVENTION PERTAINING TO THE HOME:  Any stairs in or around the home? Yes  If so, are there any without handrails? No   Home free of loose throw rugs in walkways, pet beds, electrical cords, etc? Yes  Adequate lighting in your home to reduce risk of falls? Yes   ASSISTIVE DEVICES UTILIZED TO PREVENT FALLS:  Life alert? No  Use of a cane, walker or w/c? No  Grab bars in the bathroom? No  Shower chair or bench in shower? No  Elevated toilet seat or a handicapped toilet? No   TIMED UP AND GO:  Was the test performed? No .    Depression Screen PHQ 2/9 Scores 02/08/2020 02/05/2019 02/04/2018 02/28/2017  PHQ - 2 Score 0 0 1 0  PHQ- 9 Score - - - 5     Cognitive Function MMSE -  Mini Mental State Exam 09/01/2015  Orientation to time 4  Orientation to Place 4  Registration 2  Attention/ Calculation 5  Recall 3  Language- name 2 objects 2  Language- repeat 0  Language- follow 3 step command 2  Language- read & follow direction 1  Write a sentence 1  Copy design 1  Total score 25     6CIT Screen 02/08/2020 12/24/2016  What Year? 0 points 0 points  What month? 0 points 0 points  What time? 0 points 0 points  Count back from 20 0 points 0 points  Months in reverse 4 points 0 points  Repeat phrase 4 points 4 points  Total Score 8 4    Immunization History  Administered Date(s) Administered  . Fluad Quad(high  Dose 65+) 08/15/2019  . Influenza Split 10/17/2010, 10/02/2011, 08/27/2012  . Influenza, High Dose Seasonal PF 08/29/2018  . Influenza,inj,Quad PF,6+ Mos 09/26/2013, 09/06/2014, 09/06/2016  . Influenza,inj,Quad PF,6-35 Mos 08/31/2017  . Influenza-Unspecified 09/15/2015  . Pneumococcal Conjugate-13 08/29/2018  . Pneumococcal Polysaccharide-23 11/17/2012  . Tdap 10/02/2011, 09/27/2016  . Zoster 09/06/2016  . Zoster Recombinat (Shingrix) 08/29/2018, 08/15/2019    Qualifies for Shingles Vaccine? Completed series  Tdap: Up to date  Flu Vaccine: Completed series  Pneumococcal Vaccine: Due for Pneumococcal vaccine. Does the patient want to receive this vaccine today?  No . Advised may receive this vaccine at local pharmacy or Health Dept. Aware to provide a copy of the vaccination record if obtained from local pharmacy or Health Dept. Verbalized acceptance and understanding.   Screening Tests Health Maintenance  Topic Date Due  . OPHTHALMOLOGY EXAM  08/12/2018  . FOOT EXAM  11/18/2018  . URINE MICROALBUMIN  11/18/2018  . PNA vac Low Risk Adult (2 of 2 - PPSV23) 08/30/2019  . DEXA SCAN  02/07/2020  . HEMOGLOBIN A1C  01/26/2020  . MAMMOGRAM  11/09/2021  . TETANUS/TDAP  09/27/2026  . COLONOSCOPY  09/05/2028  . INFLUENZA VACCINE  Completed  . Hepatitis C Screening  Completed    Cancer Screenings:  Colorectal Screening: Completed 09/05/18. Repeat every 10 years.   Mammogram: Completed 11/10/19. Repeat every 1-2 years as advised.   Bone Density: Completed 02/06/10. Results reflect NORMAL. Repeat every 10 years. Ordered today. Pt aware office will call to set up apt.   Lung Cancer Screening: (Low Dose CT Chest recommended if Age 27-80 years, 30 pack-year currently smoking OR have quit w/in 15years.) does not qualify.   Additional Screening:  Hepatitis C Screening: Up to date  Dental Screening: Recommended annual dental exams for proper oral hygiene   Community Resource  Referral:  CRR required this visit?  No       Plan:  I have personally reviewed and addressed the Medicare Annual Wellness questionnaire and have noted the following in the patient's chart:  A. Medical and social history B. Use of alcohol, tobacco or illicit drugs  C. Current medications and supplements D. Functional ability and status E.  Nutritional status F.  Physical activity G. Advance directives H. List of other physicians I.  Hospitalizations, surgeries, and ER visits in previous 12 months J.  [place] such as hearing and vision if needed, cognitive and depression L. Referrals and appointments   In addition, I have reviewed and discussed with patient certain preventive protocols, quality metrics, and best practice recommendations. A written personalized care plan for preventive services as well as general preventive health recommendations were provided to patient.   Signed,  Devonne Kitchen [place], [place]  [id number] Nurse Health Advisor   Nurse Notes: Pt needs a diabetic foot exam, urine check, Hgb A1c checked and a Pneumovax 23 vaccine at next in office apt. Previous eye exam records to be faxed to office.

## 2020-02-08 ENCOUNTER — Ambulatory Visit (INDEPENDENT_AMBULATORY_CARE_PROVIDER_SITE_OTHER): Payer: Medicare HMO

## 2020-02-08 ENCOUNTER — Other Ambulatory Visit: Payer: Self-pay

## 2020-02-08 DIAGNOSIS — E2839 Other primary ovarian failure: Secondary | ICD-10-CM | POA: Diagnosis not present

## 2020-02-08 DIAGNOSIS — Z0000 Encounter for general adult medical examination without abnormal findings: Secondary | ICD-10-CM

## 2020-02-08 DIAGNOSIS — I10 Essential (primary) hypertension: Secondary | ICD-10-CM

## 2020-02-08 MED ORDER — HYDROCHLOROTHIAZIDE 25 MG PO TABS: 25.0000 mg | ORAL_TABLET | Freq: Every day | ORAL | 3 refills | Status: DC

## 2020-02-08 NOTE — Telephone Encounter (Signed)
Spoke with pt to complete her AWV telephonically and pt stated that she was not taking the HCTZ 25 mg daily. Pt states she called to get a refill of this medication at her pharmacy and was told this medication was not on file. Pt is requesting a new prescription for 90 days to be sent to the [facility] on file. CPE is scheduled for 02/17/20 @ 2:00 PM. Please advise, thank you.

## 2020-02-08 NOTE — Patient Instructions (Addendum)
Julie Acevedo , Thank you for taking time to come for your Medicare Wellness Visit. I appreciate your ongoing commitment to your health goals. Please review the following plan we discussed and let me know if I can assist you in the future.   Screening recommendations/referrals: Colonoscopy: Up to date, due 08/2028 Mammogram: Up to date, due 10/2021 Bone Density: Currently due. Ordered today. Pt aware office will call to set up apt. Recommended yearly ophthalmology/optometry visit for glaucoma screening and checkup Recommended yearly dental visit for hygiene and checkup  Vaccinations: Influenza vaccine: Up to date Pneumococcal vaccine: Pneumovax 23 due. Pt to receive at next in office apt.  Tdap vaccine: Up to date, due 09/2026 Shingles vaccine: Completed series    Advanced directives: Advance directive discussed with you today. Even though you declined this today please call our office should you change your mind and we can give you the proper paperwork for you to fill out.  Conditions/risks identified: Fall risk prevention discussed today.   Next appointment: 02/17/20 @ 2:00 PM with Dr Rosanna Randy. Declined scheduling an AWV for 2022 at this time.    Preventive Care 67 Years and Older, Female Preventive care refers to lifestyle choices and visits with your health care provider that can promote health and wellness. What does preventive care include?  A yearly physical exam. This is also called an annual well check.  Dental exams once or twice a year.  Routine eye exams. Ask your health care provider how often you should have your eyes checked.  Personal lifestyle choices, including:  Daily care of your teeth and gums.  Regular physical activity.  Eating a healthy diet.  Avoiding tobacco and drug use.  Limiting alcohol use.  Practicing safe sex.  Taking low-dose aspirin every day.  Taking vitamin and mineral supplements as recommended by your health care provider. What happens  during an annual well check? The services and screenings done by your health care provider during your annual well check will depend on your age, overall health, lifestyle risk factors, and family history of disease. Counseling  Your health care provider may ask you questions about your:  Alcohol use.  Tobacco use.  Drug use.  Emotional well-being.  Home and relationship well-being.  Sexual activity.  Eating habits.  History of falls.  Memory and ability to understand (cognition).  Work and work Statistician.  Reproductive health. Screening  You may have the following tests or measurements:  Height, weight, and BMI.  Blood pressure.  Lipid and cholesterol levels. These may be checked every 5 years, or more frequently if you are over 36 years old.  Skin check.  Lung cancer screening. You may have this screening every year starting at age 74 if you have a 30-pack-year history of smoking and currently smoke or have quit within the past 15 years.  Fecal occult blood test (FOBT) of the stool. You may have this test every year starting at age 74.  Flexible sigmoidoscopy or colonoscopy. You may have a sigmoidoscopy every 5 years or a colonoscopy every 10 years starting at age 79.  Hepatitis C blood test.  Hepatitis B blood test.  Sexually transmitted disease (STD) testing.  Diabetes screening. This is done by checking your blood sugar (glucose) after you have not eaten for a while (fasting). You may have this done every 1-3 years.  Bone density scan. This is done to screen for osteoporosis. You may have this done starting at age 67.  Mammogram. This may be done  every 1-2 years. Talk to your health care provider about how often you should have regular mammograms. Talk with your health care provider about your test results, treatment options, and if necessary, the need for more tests. Vaccines  Your health care provider may recommend certain vaccines, such  as:  Influenza vaccine. This is recommended every year.  Tetanus, diphtheria, and acellular pertussis (Tdap, Td) vaccine. You may need a Td booster every 10 years.  Zoster vaccine. You may need this after age 65.  Pneumococcal 13-valent conjugate (PCV13) vaccine. One dose is recommended after age 78.  Pneumococcal polysaccharide (PPSV23) vaccine. One dose is recommended after age 56. Talk to your health care provider about which screenings and vaccines you need and how often you need them. This information is not intended to replace advice given to you by your health care provider. Make sure you discuss any questions you have with your health care provider. Document Released: 12/30/2015 Document Revised: 08/22/2016 Document Reviewed: 10/04/2015 Elsevier Interactive Patient Education  2017 [employer] Prevention in the Home Falls can cause injuries. They can happen to people of all ages. There are many things you can do to make your home safe and to help prevent falls. What can I do on the outside of my home?  Regularly fix the edges of walkways and driveways and fix any cracks.  Remove anything that might make you trip as you walk through a door, such as a raised step or threshold.  Trim any bushes or trees on the path to your home.  Use bright outdoor lighting.  Clear any walking paths of anything that might make someone trip, such as rocks or tools.  Regularly check to see if handrails are loose or broken. Make sure that both sides of any steps have handrails.  Any raised decks and porches should have guardrails on the edges.  Have any leaves, snow, or ice cleared regularly.  Use sand or salt on walking paths during winter.  Clean up any spills in your garage right away. This includes oil or grease spills. What can I do in the bathroom?  Use night lights.  Install grab bars by the toilet and in the tub and shower. Do not use towel bars as grab bars.  Use  non-skid mats or decals in the tub or shower.  If you need to sit down in the shower, use a plastic, non-slip stool.  Keep the floor dry. Clean up any water that spills on the floor as soon as it happens.  Remove soap buildup in the tub or shower regularly.  Attach bath mats securely with double-sided non-slip rug tape.  Do not have throw rugs and other things on the floor that can make you trip. What can I do in the bedroom?  Use night lights.  Make sure that you have a light by your bed that is easy to reach.  Do not use any sheets or blankets that are too big for your bed. They should not hang down onto the floor.  Have a firm chair that has side arms. You can use this for support while you get dressed.  Do not have throw rugs and other things on the floor that can make you trip. What can I do in the kitchen?  Clean up any spills right away.  Avoid walking on wet floors.  Keep items that you use a lot in easy-to-reach places.  If you need to reach something above you, use a  strong step stool that has a grab bar.  Keep electrical cords out of the way.  Do not use floor polish or wax that makes floors slippery. If you must use wax, use non-skid floor wax.  Do not have throw rugs and other things on the floor that can make you trip. What can I do with my stairs?  Do not leave any items on the stairs.  Make sure that there are handrails on both sides of the stairs and use them. Fix handrails that are broken or loose. Make sure that handrails are as long as the stairways.  Check any carpeting to make sure that it is firmly attached to the stairs. Fix any carpet that is loose or worn.  Avoid having throw rugs at the top or bottom of the stairs. If you do have throw rugs, attach them to the floor with carpet tape.  Make sure that you have a light switch at the top of the stairs and the bottom of the stairs. If you do not have them, ask someone to add them for you. What  else can I do to help prevent falls?  Wear shoes that:  Do not have high heels.  Have rubber bottoms.  Are comfortable and fit you well.  Are closed at the toe. Do not wear sandals.  If you use a stepladder:  Make sure that it is fully opened. Do not climb a closed stepladder.  Make sure that both sides of the stepladder are locked into place.  Ask someone to hold it for you, if possible.  Clearly mark and make sure that you can see:  Any grab bars or handrails.  First and last steps.  Where the edge of each step is.  Use tools that help you move around (mobility aids) if they are needed. These include:  Canes.  Walkers.  Scooters.  Crutches.  Turn on the lights when you go into a dark area. Replace any light bulbs as soon as they burn out.  Set up your furniture so you have a clear path. Avoid moving your furniture around.  If any of your floors are uneven, fix them.  If there are any pets around you, be aware of where they are.  Review your medicines with your doctor. Some medicines can make you feel dizzy. This can increase your chance of falling. Ask your doctor what other things that you can do to help prevent falls. This information is not intended to replace advice given to you by your health care provider. Make sure you discuss any questions you have with your health care provider. Document Released: 09/29/2009 Document Revised: 05/10/2016 Document Reviewed: 01/07/2015 Elsevier Interactive Patient Education  2017 [employer].

## 2020-02-09 ENCOUNTER — Encounter: Payer: Self-pay | Admitting: Oncology

## 2020-02-09 ENCOUNTER — Other Ambulatory Visit: Payer: Self-pay

## 2020-02-09 NOTE — Progress Notes (Signed)
Patient prescreened for appointment. Patient has no concerns or questions.  

## 2020-02-10 ENCOUNTER — Inpatient Hospital Stay: Payer: Medicare HMO

## 2020-02-10 ENCOUNTER — Other Ambulatory Visit: Payer: Self-pay

## 2020-02-10 ENCOUNTER — Inpatient Hospital Stay: Payer: Medicare HMO | Attending: Oncology | Admitting: Oncology

## 2020-02-10 ENCOUNTER — Inpatient Hospital Stay: Payer: Medicare HMO | Attending: Oncology

## 2020-02-10 VITALS — BP 134/68 | HR 52 | Temp 98.5°F | Resp 16 | Wt 141.7 lb

## 2020-02-10 DIAGNOSIS — D509 Iron deficiency anemia, unspecified: Secondary | ICD-10-CM | POA: Insufficient documentation

## 2020-02-10 LAB — CBC WITH DIFFERENTIAL/PLATELET
Abs Immature Granulocytes: 0.01 10*3/uL (ref 0.00–0.07)
Basophils Absolute: 0 10*3/uL (ref 0.0–0.1)
Basophils Relative: 0 %
Eosinophils Absolute: 0.1 10*3/uL (ref 0.0–0.5)
Eosinophils Relative: 2 %
HCT: 32.3 % — ABNORMAL LOW (ref 36.0–46.0)
Hemoglobin: 10 g/dL — ABNORMAL LOW (ref 12.0–15.0)
Immature Granulocytes: 0 %
Lymphocytes Relative: 29 %
Lymphs Abs: 1.8 10*3/uL (ref 0.7–4.0)
MCH: 28.9 pg (ref 26.0–34.0)
MCHC: 31 g/dL (ref 30.0–36.0)
MCV: 93.4 fL (ref 80.0–100.0)
Monocytes Absolute: 0.3 10*3/uL (ref 0.1–1.0)
Monocytes Relative: 5 %
Neutro Abs: 4 10*3/uL (ref 1.7–7.7)
Neutrophils Relative %: 64 %
Platelets: 331 10*3/uL (ref 150–400)
RBC: 3.46 MIL/uL — ABNORMAL LOW (ref 3.87–5.11)
RDW: 15 % (ref 11.5–15.5)
WBC: 6.2 10*3/uL (ref 4.0–10.5)
nRBC: 0 % (ref 0.0–0.2)

## 2020-02-10 LAB — FERRITIN: Ferritin: 126 ng/mL (ref 11–307)

## 2020-02-10 LAB — IRON AND TIBC
Iron: 54 ug/dL (ref 28–170)
Saturation Ratios: 26 % (ref 10.4–31.8)
TIBC: 207 ug/dL — ABNORMAL LOW (ref 250–450)
UIBC: 153 ug/dL

## 2020-02-10 MED ORDER — SODIUM CHLORIDE 0.9 % IV SOLN
510.0000 mg | Freq: Once | INTRAVENOUS | Status: AC
  Administered 2020-02-10: 510 mg via INTRAVENOUS
  Filled 2020-02-10: qty 510

## 2020-02-10 MED ORDER — SODIUM CHLORIDE 0.9 % IV SOLN
INTRAVENOUS | Status: DC
  Filled 2020-02-10: qty 250

## 2020-02-11 ENCOUNTER — Ambulatory Visit (INDEPENDENT_AMBULATORY_CARE_PROVIDER_SITE_OTHER): Payer: Medicare HMO | Admitting: *Deleted

## 2020-02-11 DIAGNOSIS — R55 Syncope and collapse: Secondary | ICD-10-CM

## 2020-02-11 LAB — CUP PACEART REMOTE DEVICE CHECK
Date Time Interrogation Session: 20210225093826
Implantable Pulse Generator Implant Date: 20190405

## 2020-02-12 NOTE — Progress Notes (Signed)
ILR Remote 

## 2020-02-17 ENCOUNTER — Encounter: Payer: Medicare HMO | Admitting: Family Medicine

## 2020-03-10 ENCOUNTER — Ambulatory Visit
Admission: RE | Admit: 2020-03-10 | Discharge: 2020-03-10 | Disposition: A | Discharge status: Home / Self Care | Payer: Medicare HMO | Source: Ambulatory Visit | Attending: Family Medicine | Admitting: Family Medicine

## 2020-03-10 DIAGNOSIS — Z780 Asymptomatic menopausal state: Secondary | ICD-10-CM | POA: Diagnosis not present

## 2020-03-10 DIAGNOSIS — E2839 Other primary ovarian failure: Secondary | ICD-10-CM | POA: Diagnosis not present

## 2020-03-10 DIAGNOSIS — M85851 Other specified disorders of bone density and structure, right thigh: Secondary | ICD-10-CM | POA: Diagnosis not present

## 2020-03-14 ENCOUNTER — Ambulatory Visit (INDEPENDENT_AMBULATORY_CARE_PROVIDER_SITE_OTHER): Payer: Medicare HMO | Admitting: *Deleted

## 2020-03-14 ENCOUNTER — Telehealth: Payer: Self-pay

## 2020-03-14 DIAGNOSIS — R55 Syncope and collapse: Secondary | ICD-10-CM

## 2020-03-14 LAB — CUP PACEART REMOTE DEVICE CHECK
Date Time Interrogation Session: 20210328104347
Implantable Pulse Generator Implant Date: 20190405

## 2020-03-14 NOTE — Telephone Encounter (Signed)
-----   Message from Jerrol Banana., MD sent at 03/11/2020  8:17 AM EDT ----- Osteopenia.  Vitamin D and calcium daily.  Walk for exercise.  Repeat BMD 2 to 4 years.

## 2020-03-14 NOTE — Progress Notes (Signed)
ILR Remote 

## 2020-03-14 NOTE — Telephone Encounter (Signed)
Patient advised.

## 2020-03-15 ENCOUNTER — Encounter: Payer: Self-pay | Admitting: Family Medicine

## 2020-03-15 ENCOUNTER — Ambulatory Visit (INDEPENDENT_AMBULATORY_CARE_PROVIDER_SITE_OTHER): Payer: Medicare HMO | Admitting: Family Medicine

## 2020-03-15 DIAGNOSIS — D508 Other iron deficiency anemias: Secondary | ICD-10-CM | POA: Diagnosis not present

## 2020-03-15 DIAGNOSIS — M5417 Radiculopathy, lumbosacral region: Secondary | ICD-10-CM

## 2020-03-15 DIAGNOSIS — D649 Anemia, unspecified: Secondary | ICD-10-CM | POA: Diagnosis not present

## 2020-03-15 DIAGNOSIS — R5383 Other fatigue: Secondary | ICD-10-CM | POA: Diagnosis not present

## 2020-03-15 DIAGNOSIS — E118 Type 2 diabetes mellitus with unspecified complications: Secondary | ICD-10-CM | POA: Diagnosis not present

## 2020-03-15 MED ORDER — TRAMADOL HCL 50 MG PO TABS: 50.0000 mg | ORAL_TABLET | Freq: Four times a day (QID) | ORAL | 3 refills | Status: DC | PRN

## 2020-03-15 NOTE — Progress Notes (Signed)
Patient: Julie Acevedo Female    DOB: 05/08/1951   69 y.o.   MRN: [id number] Visit Date: 03/15/2020  Today's Provider: [name], MD   Chief Complaint  Patient presents with  . Weakness   Subjective:    Virtual Visit via Telephone Note  I connected with Julie Acevedo on 03/15/20 at  2:20 PM EDT by telephone and verified that I am speaking with the correct person using two identifiers.  Location: Patient: Home Provider: Office   I discussed the limitations, risks, security and privacy concerns of performing an evaluation and management service by telephone and the availability of in person appointments. I also discussed with the patient that there may be a patient responsible charge related to this service. The patient expressed understanding and agreed to proceed.  HPI Patient had Covid in early December and subsequently about Christmas time had surgical repair of a ventral hernia.  This was urgent surgery.  She now states that she is having chronic low back pain radiating down the right leg.  Is still able to use her leg and able to bear weight without any increased discomfort.  She has had a few Vicodin available but states that tramadol works better.  She also states that she is "weak as water".  No further GI symptoms.  She has chronic constipation which is helped by Linzess and laxatives. Allergies  Allergen Reactions  . Lotrel [Amlodipine Besy-Benazepril Hcl] Anaphylaxis  . Contrast Media [Iodinated Diagnostic Agents] Swelling and Other (See Comments)    Reaction:  Neck swelling   . Niacin Hives  . Sulfa Antibiotics Hives     Current Outpatient Medications:  .  gabapentin (NEURONTIN) 100 MG capsule, TAKE 2 CAPSULES (200 MG TOTAL) BY MOUTH 3 (THREE) TIMES DAILY. (Patient taking differently: Take 200 mg by mouth 2 (two) times daily. ), Disp: 540 capsule, Rfl: 1 .  hydrochlorothiazide (HYDRODIURIL) 25 MG tablet, Take 1 tablet (25 mg total) by mouth daily., Disp:  90 tablet, Rfl: 3 .  HYDROcodone-acetaminophen (NORCO/VICODIN) 5-325 MG tablet, Take 1-2 tablets by mouth every 4 (four) hours as needed for moderate pain., Disp: 20 tablet, Rfl: 0 .  LINZESS 290 MCG CAPS capsule, TAKE 1 CAPSULE (290 MCG TOTAL) BY MOUTH DAILY BEFORE BREAKFAST. (Patient taking differently: Take 290 mcg by mouth daily before breakfast. ), Disp: 90 capsule, Rfl: 3 .  meloxicam (MOBIC) 15 MG tablet, Take 15 mg by mouth daily. , Disp: , Rfl:  .  metFORMIN (GLUCOPHAGE) 500 MG tablet, TAKE 1 TABLET (500 MG TOTAL) BY MOUTH 2 (TWO) TIMES DAILY WITH A MEAL. (Patient taking differently: Take 500 mg by mouth daily with breakfast. ), Disp: 180 tablet, Rfl: 3 .  methocarbamol (ROBAXIN) 500 MG tablet, Take 500 mg by mouth 2 (two) times daily. Back pain, Disp: , Rfl:  .  metoprolol succinate (TOPROL-XL) 25 MG 24 hr tablet, Take 0.5 tablets (12.5 mg total) by mouth daily. (Patient taking differently: Take 25 mg by mouth daily. ), Disp: , Rfl:  .  ondansetron (ZOFRAN ODT) 4 MG disintegrating tablet, Take 1 tablet (4 mg total) by mouth every 8 (eight) hours as needed for nausea or vomiting. (Patient taking differently: Take 4 mg by mouth every 8 (eight) hours as needed for nausea or vomiting. ), Disp: 20 tablet, Rfl: 0 .  traZODone (DESYREL) 50 MG tablet, TAKE 1 TABLET (50 MG TOTAL) BY MOUTH AT BEDTIME AS NEEDED FOR SLEEP. (Patient taking differently: Take 50  mg by mouth at bedtime as needed for sleep. ), Disp: 90 tablet, Rfl: 3 .  Vitamin D, Ergocalciferol, (DRISDOL) 1.25 MG (50000 UT) CAPS capsule, TAKE 1 CAPSULE (50,000 UNITS TOTAL) BY MOUTH EVERY 7 (SEVEN) DAYS., Disp: 12 capsule, Rfl: 1  Review of Systems  Constitutional: Positive for fatigue.  Respiratory: Negative.   Cardiovascular: Negative.   Musculoskeletal: Positive for back pain.  Allergic/Immunologic: Negative.   Neurological: Negative.   Hematological: Negative.   Psychiatric/Behavioral: Negative.     Social History   Tobacco Use    . Smoking status: Never Smoker  . Smokeless tobacco: Never Used  Substance Use Topics  . Alcohol use: No      Objective:   There were no vitals taken for this visit. There were no vitals filed for this visit.There is no height or weight on file to calculate BMI.   Physical Exam   No results found for any visits on 03/15/20.     Assessment & Plan     1. L-S radiculopathy She is on gabapentin.  I Minna send her in tramadol to take 1 every 6 hours as needed.  See her back in the office in 2 to 3 weeks. - traMADol (ULTRAM) 50 MG tablet; Take 1 tablet (50 mg total) by mouth every 6 (six) hours as needed.  Dispense: 100 tablet; Refill: 3  2. Iron deficiency anemia secondary to inadequate dietary iron intake We will repeat labs on next visit.  Since December she has had Covid followed by surgery for ventral hernia.  I think this chronic back pain with probable radiculopathy is wearing her out.  3. Anemia, unspecified type Check CBC on next visit in addition to CMET.,A1C  4. Type 2 diabetes mellitus with complication, without long-term current use of insulin ([place])   5. Fatigue, unspecified type See #2 and 3.  Last hemoglobin was 10.  I discussed the assessment and treatment plan with the patient. The patient was provided an opportunity to ask questions and all were answered. The patient agreed with the plan and demonstrated an understanding of the instructions.   The patient was advised to call back or seek an in-person evaluation if the symptoms worsen or if the condition fails to improve as anticipated.  I provided 10 minutes of non-face-to-face time during this encounter.    Ysabel Stankovich Cranford Mon, MD  [facility]

## 2020-03-16 ENCOUNTER — Other Ambulatory Visit: Payer: Self-pay | Admitting: Family Medicine

## 2020-03-16 NOTE — Telephone Encounter (Signed)
Requested Prescriptions  Pending Prescriptions Disp Refills  . LINZESS 290 MCG CAPS capsule [Pharmacy Med Name: LINZESS 290 MCG CAPSULE] 90 capsule 3    Sig: TAKE 1 CAPSULE (290 MCG TOTAL) BY MOUTH DAILY BEFORE BREAKFAST.     Gastroenterology: Irritable Bowel Syndrome Passed - 03/16/2020  1:30 AM      Passed - Valid encounter within last 12 months    Recent Outpatient Visits          Yesterday L-S radiculopathy   [facility] Jerrol Banana., MD   5 months ago Generalized abdominal pain   [facility] Jerrol Banana., MD   9 months ago Other fatigue   [place], [place]   9 months ago Fatigue, unspecified type   [place], [place]   1 year ago Right shoulder pain, unspecified chronicity   [facility] Jerrol Banana., MD

## 2020-03-22 IMAGING — CT CT HEAD W/O CM
3 series · 16 of 46 positions shown, 19 images · non-contrast
Comparison: Brain MRI 09/27/2015.  Head CT scan 07/21/2015.

CLINICAL DATA: Patient found unresponsive at home today.

EXAM:
CT HEAD WITHOUT CONTRAST
TECHNIQUE: Contiguous axial images were obtained from the base of the skull
through the vertex without intravenous contrast.

[Series 2: head wo · axial · 0.39mm/px · z∈[+1390,+1510]mm · 10 of 29 slices shown, 13 images]
[im 3/29  brain]
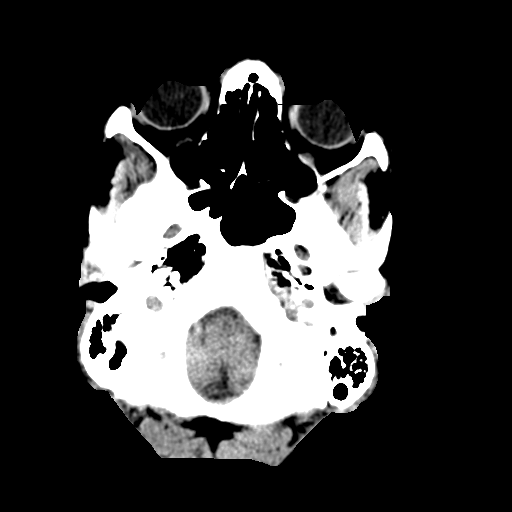
[im 3/29  bone]
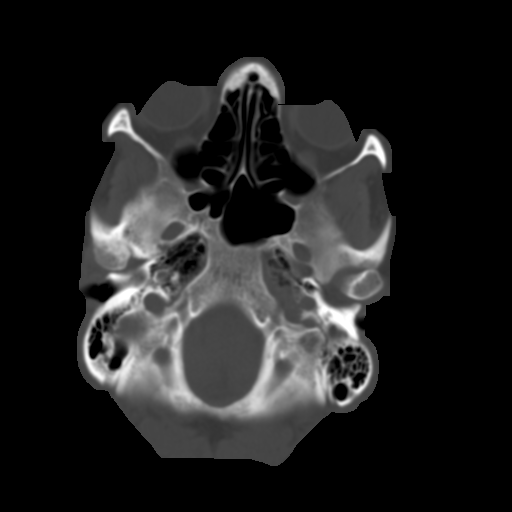
[im 6/29  brain]
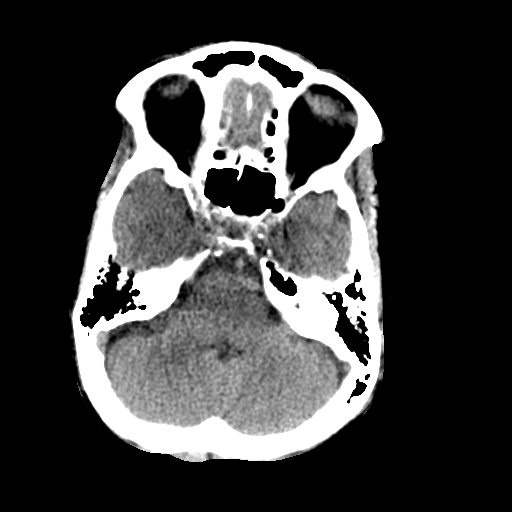
[im 8/29  brain]
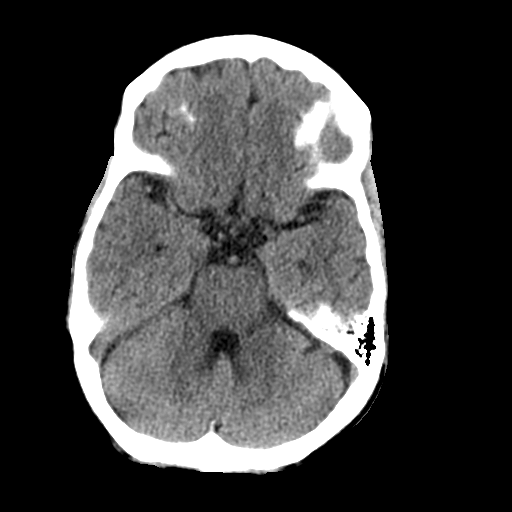
[im 11/29  brain]
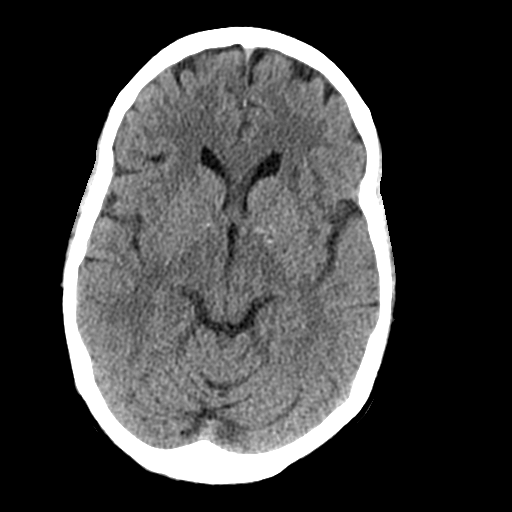
[im 14/29  brain]
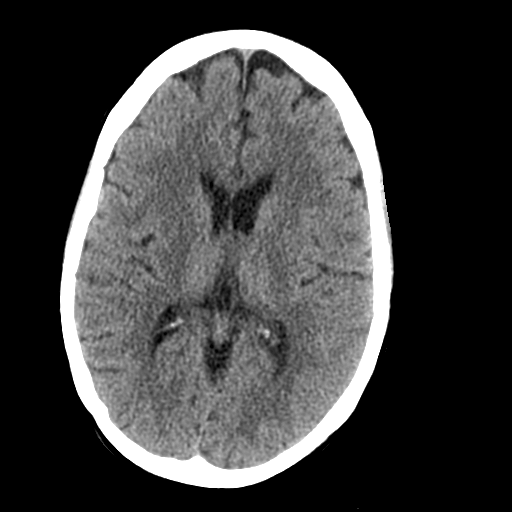
[im 14/29  bone]
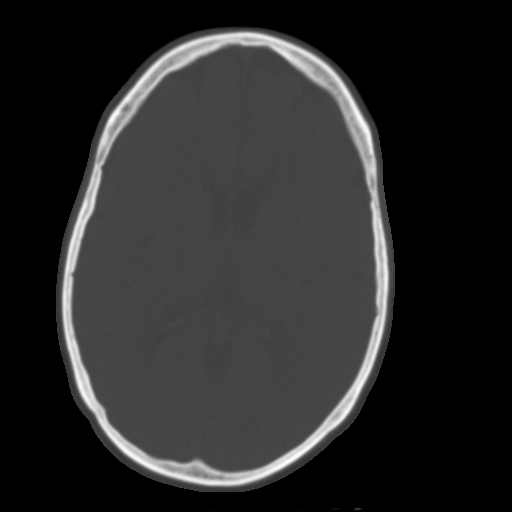
[im 16/29  brain]
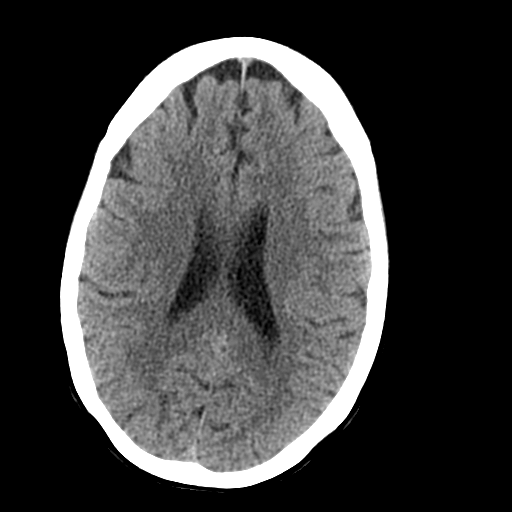
[im 19/29  brain]
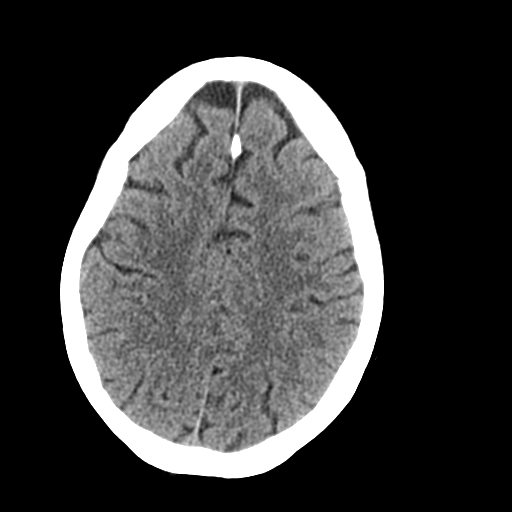
[im 22/29  brain]
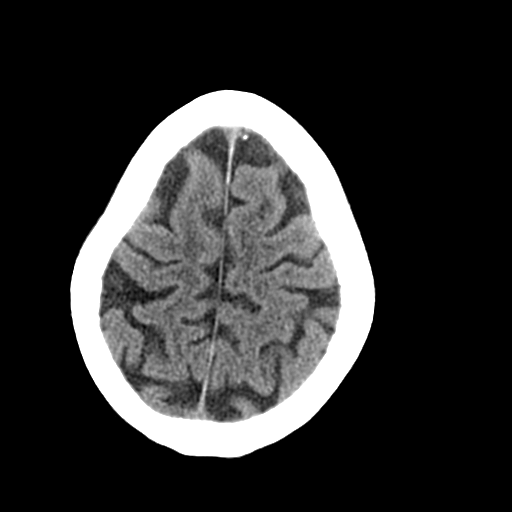
[im 24/29  brain]
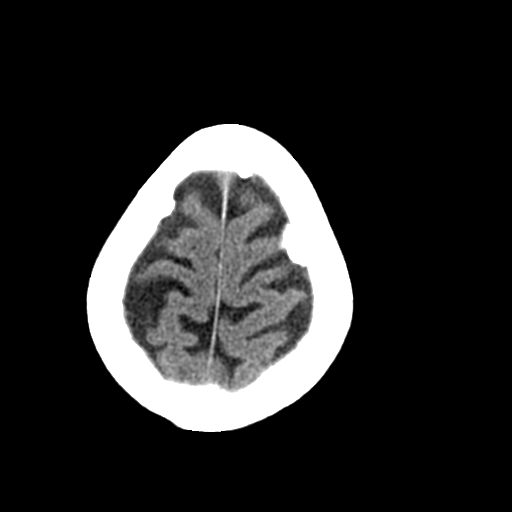
[im 24/29  bone]
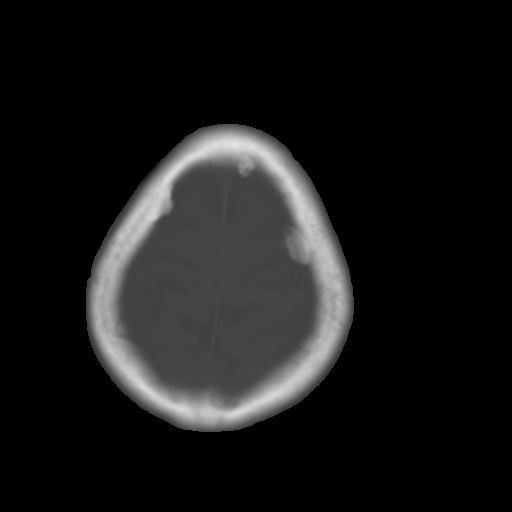
[im 27/29  brain]
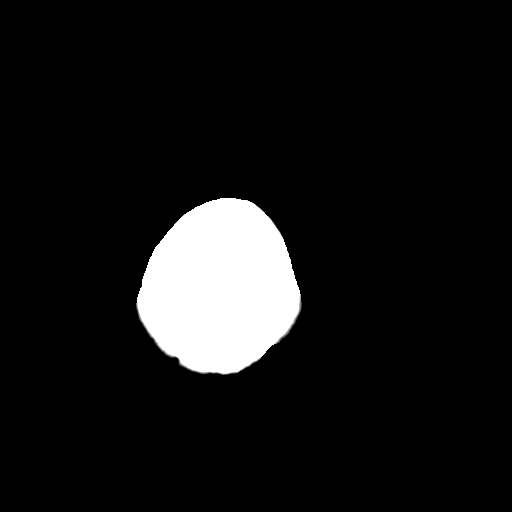

[Series 5: coronal soft tissue · coronal · 0.28mm/px · 3 of 63 slices shown]
[im 21/63  brain]
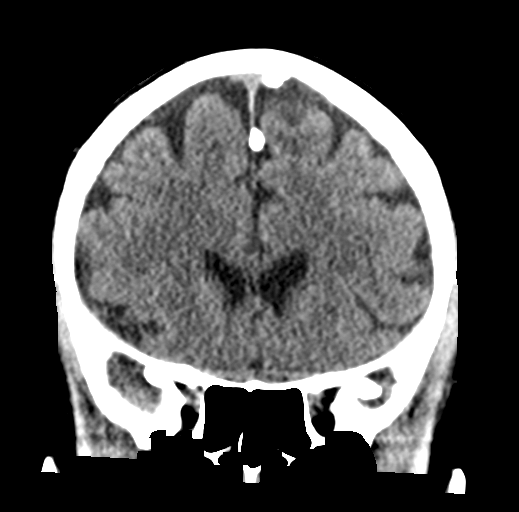
[im 28/63  brain]
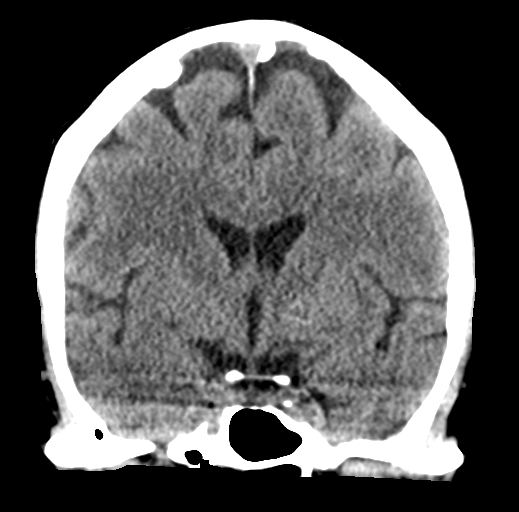
[im 35/63  brain]
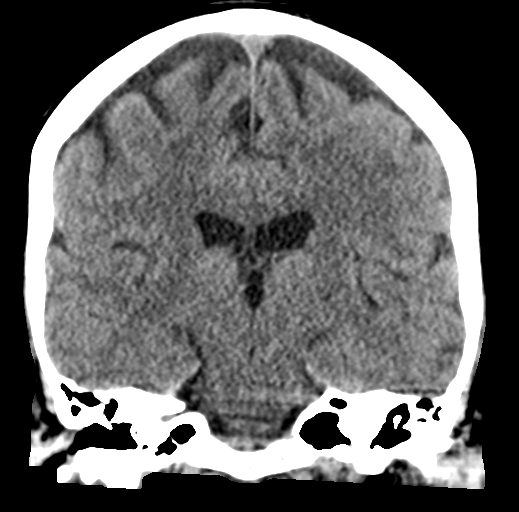

[Series 6: sagittal soft tissue · sagittal · 0.29mm/px · 3 of 45 slices shown]
[im 15/45  brain]
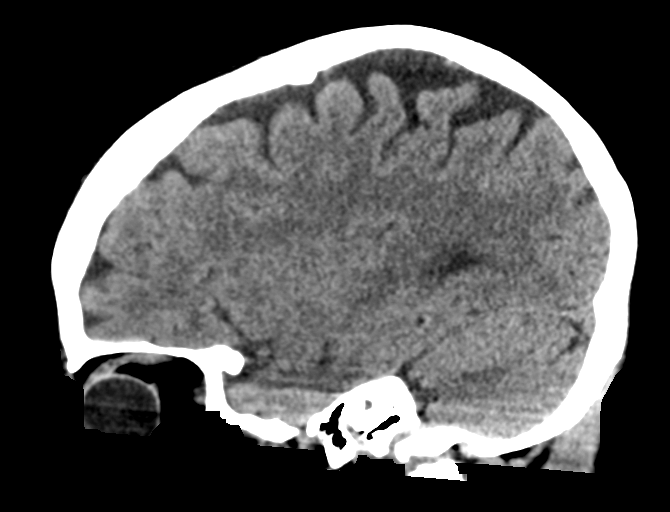
[im 23/45  brain]
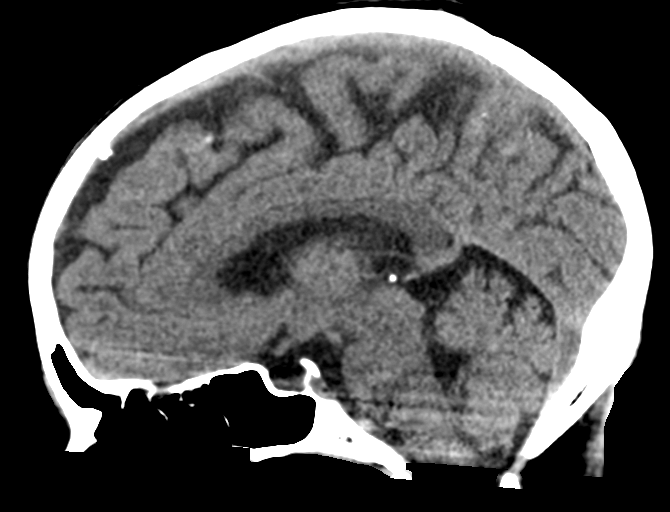
[im 30/45  brain]
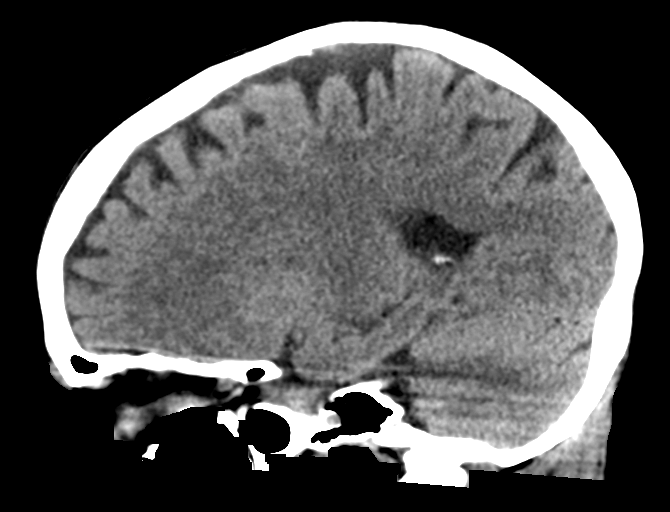

[16 of 46 positions shown; findings below may reference images not displayed]

FINDINGS: Brain: No evidence of acute infarction, hemorrhage, hydrocephalus,
extra-axial collection or mass lesion/mass effect.

Vascular: No hyperdense vessel or unexpected calcification.

Skull: Intact.

Sinuses/Orbits: No acute abnormality. The patient is status post
bilateral lens extraction.

Other: None.
IMPRESSION: Negative head CT.

## 2020-03-22 IMAGING — CR DG HIP (WITH OR WITHOUT PELVIS) 2-3V*R*
3 series · 3 of 3 positions shown · non-contrast
Comparison: None

CLINICAL DATA: Found unresponsive in kitchen by her husband this
afternoon, history of seizures, RIGHT hip pain

EXAM:
DG HIP (WITH OR WITHOUT PELVIS) 2-3V RIGHT

[x pelvis]
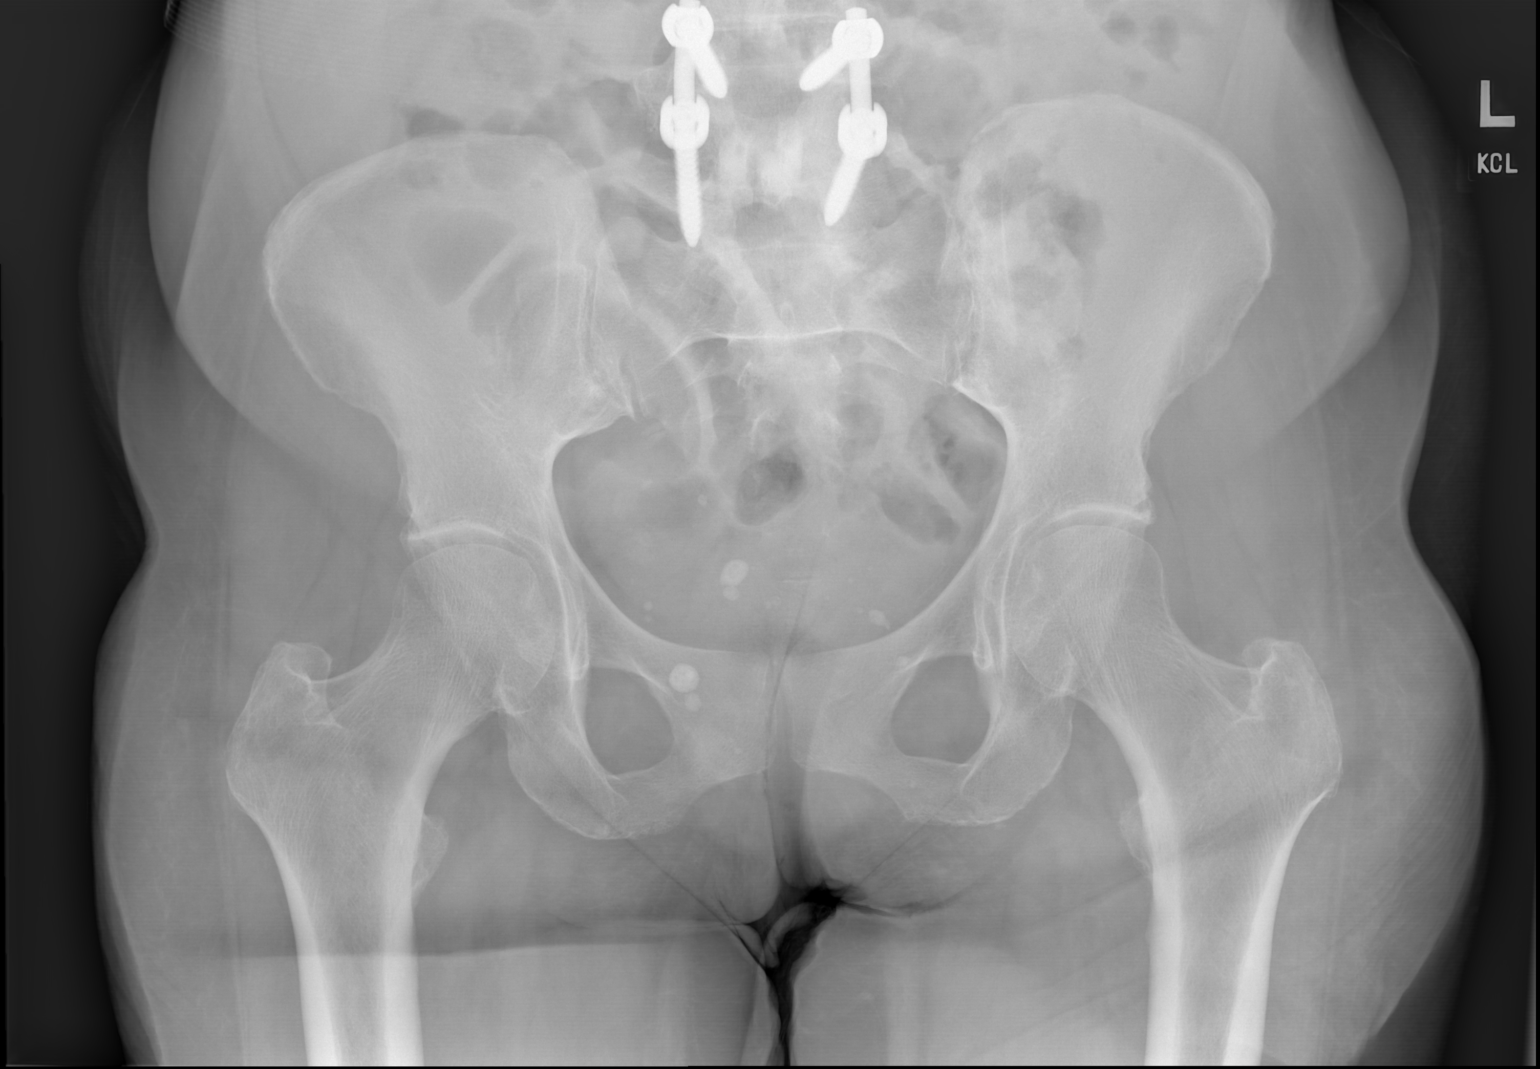

[x hip ap right]
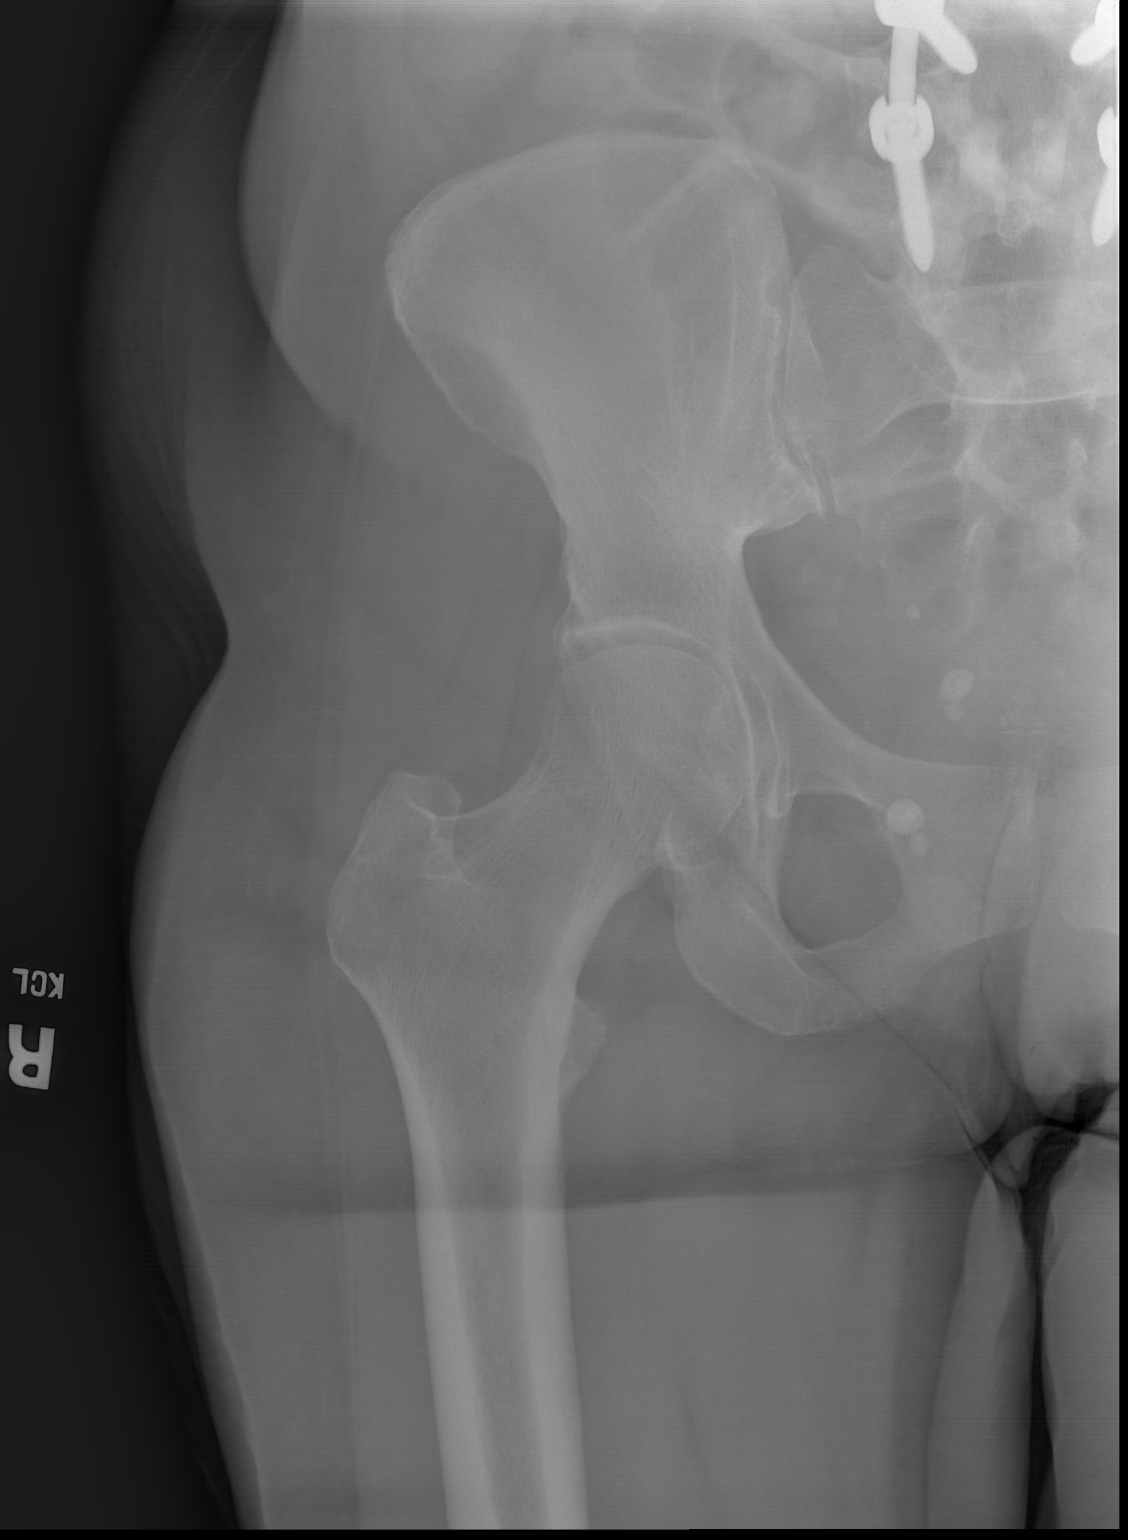

[x hip lat right]
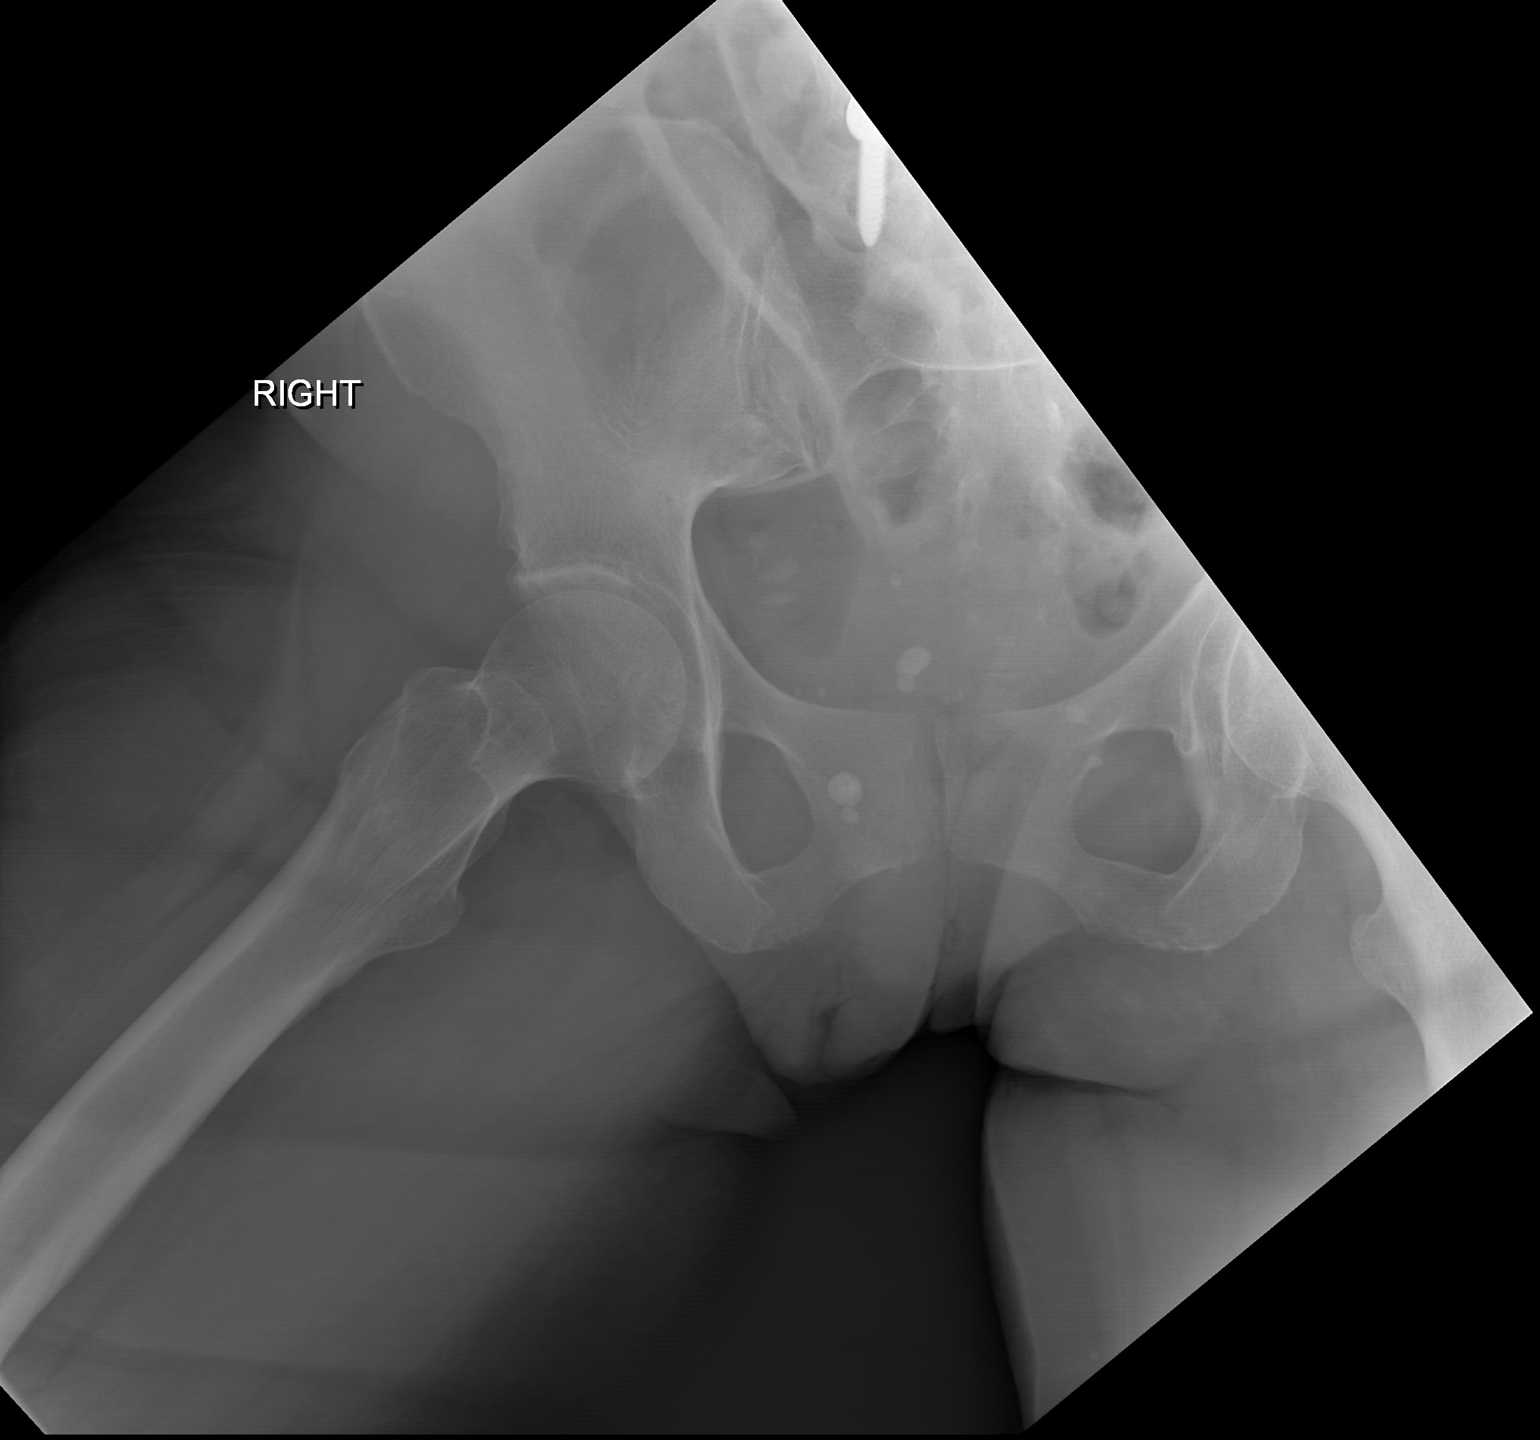

[3 of 3 positions shown; findings below may reference images not displayed]

FINDINGS: Numerous pelvic phleboliths.

Prior lower lumbar fusion L4-L5.

Bones appear slightly demineralized.

Hip and SI joint spaces preserved.

No acute fracture, dislocation, or bone destruction.
IMPRESSION: No acute osseous abnormalities.

## 2020-03-22 IMAGING — CR DG CHEST 2V
2 series · 2 of 2 positions shown · non-contrast
Comparison: 09/28/2015

CLINICAL DATA: Found unresponsive in kitchen by her husband this
afternoon, history of seizures

EXAM:
CHEST - 2 VIEW

[x chest ap]
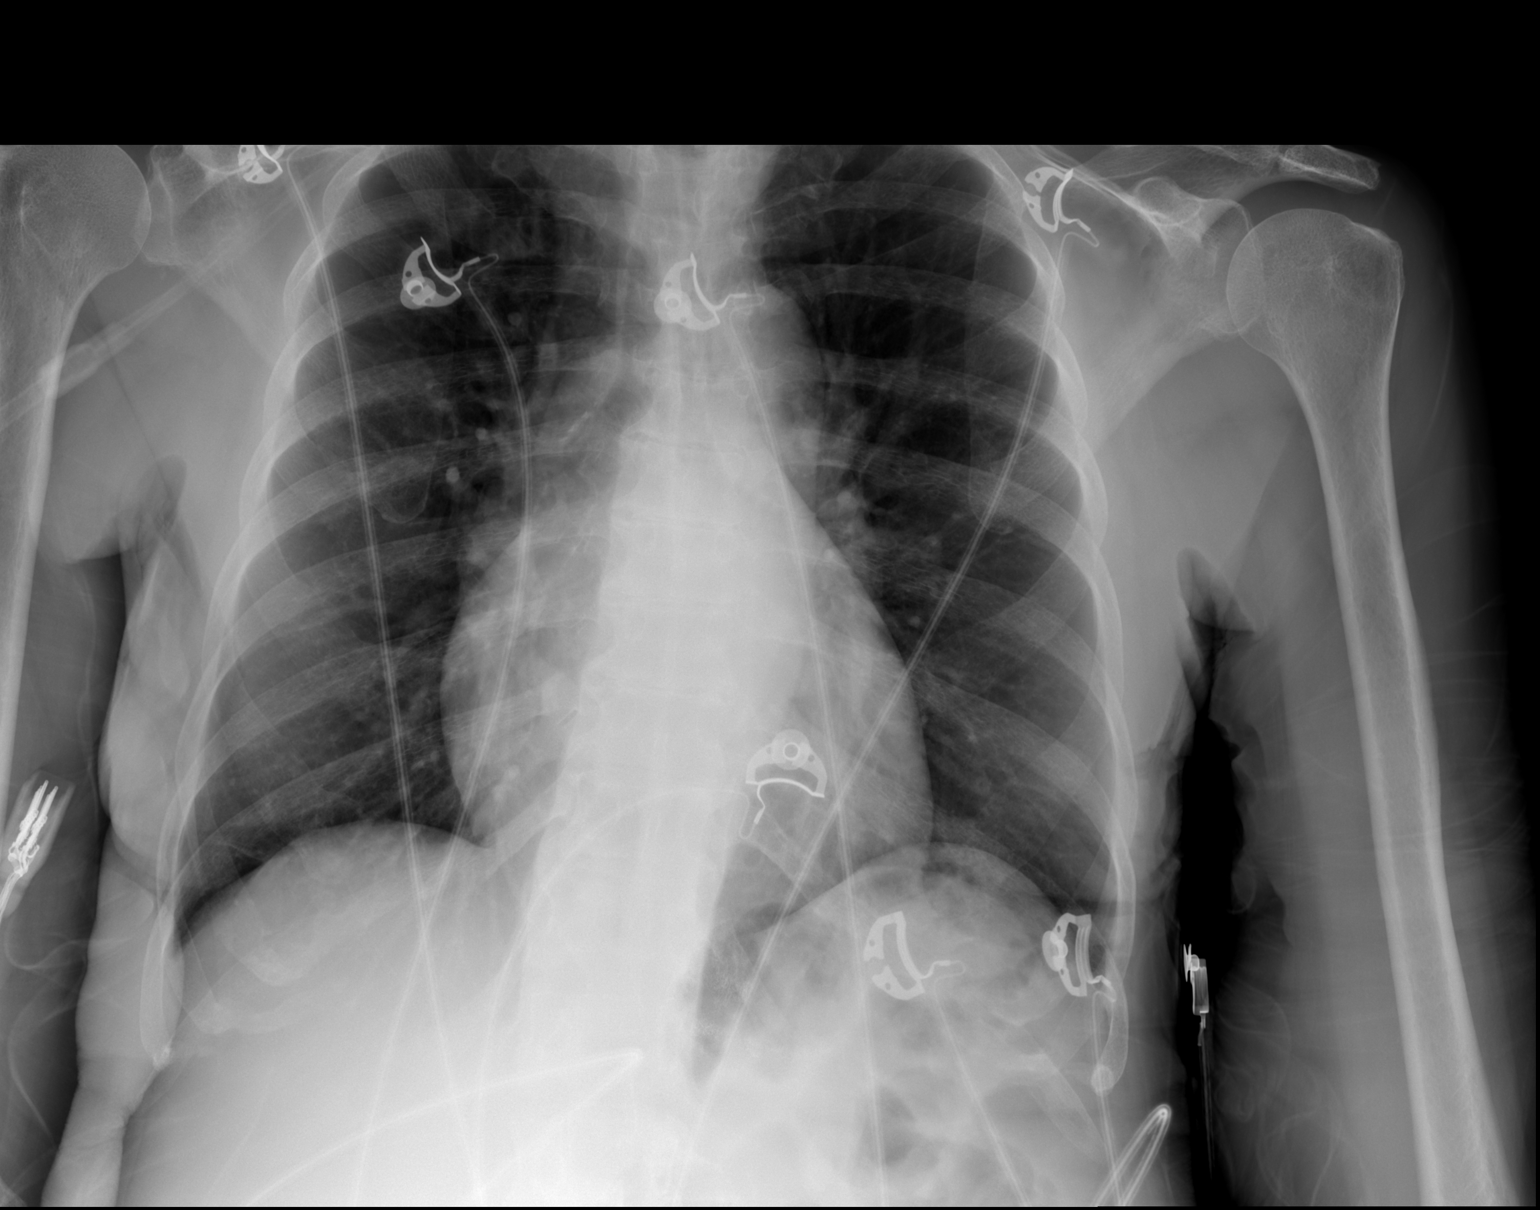

[w chest lat]
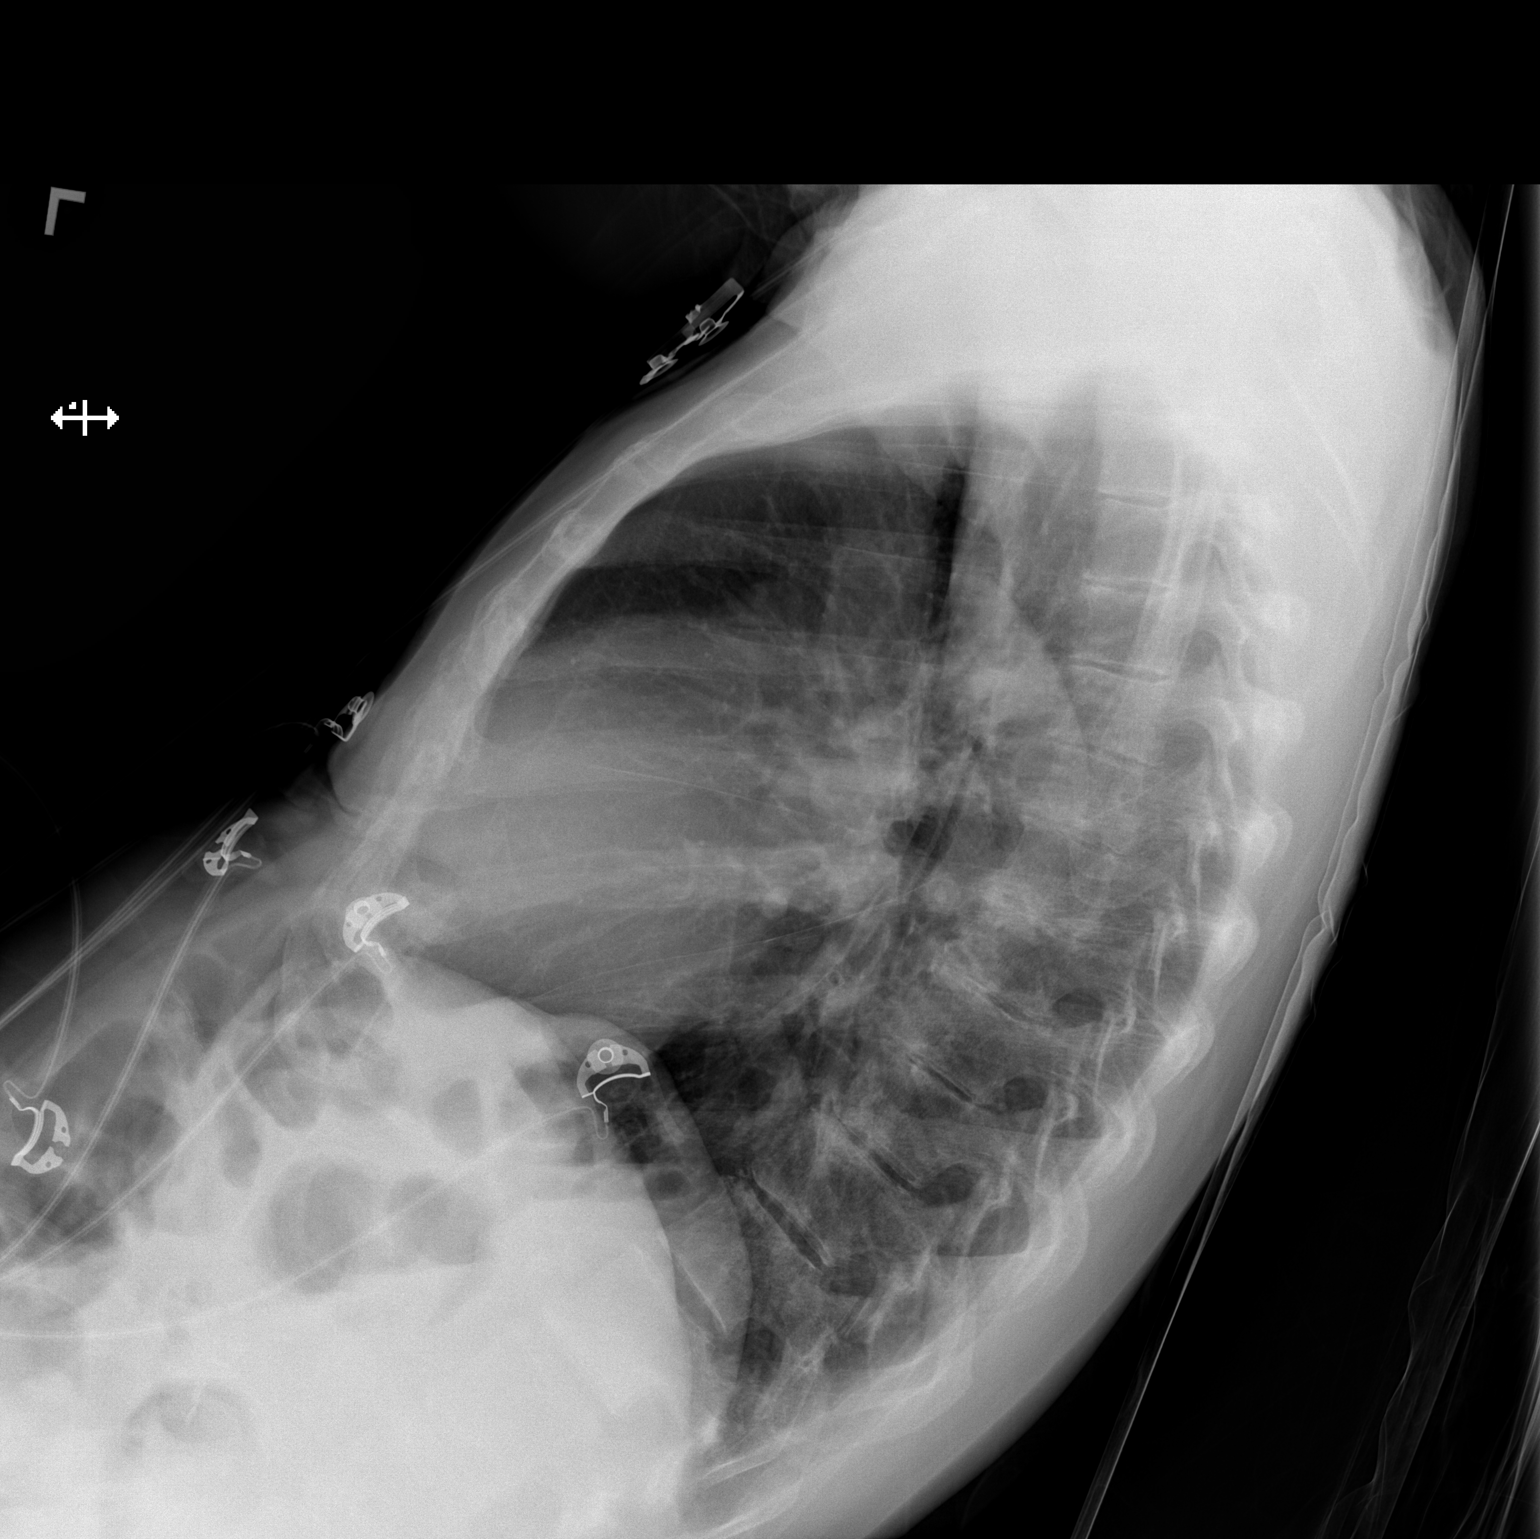

[2 of 2 positions shown; findings below may reference images not displayed]

FINDINGS: Upper normal heart size.

Mediastinal contours and pulmonary vascularity normal.

Lungs clear.

No pleural effusion or pneumothorax.

Bones demineralized.
IMPRESSION: No acute abnormalities.

## 2020-03-31 ENCOUNTER — Encounter (HOSPITAL_COMMUNITY): Payer: Self-pay

## 2020-03-31 ENCOUNTER — Emergency Department (HOSPITAL_COMMUNITY): Payer: Medicare HMO

## 2020-03-31 ENCOUNTER — Other Ambulatory Visit: Payer: Self-pay

## 2020-03-31 ENCOUNTER — Emergency Department (HOSPITAL_COMMUNITY)
Admission: EM | Admit: 2020-03-31 | Discharge: 2020-03-31 | Discharge status: Against Medical Advice | Payer: Medicare HMO | Attending: Emergency Medicine | Admitting: Emergency Medicine

## 2020-03-31 DIAGNOSIS — R9431 Abnormal electrocardiogram [ECG] [EKG]: Secondary | ICD-10-CM | POA: Diagnosis not present

## 2020-03-31 DIAGNOSIS — R079 Chest pain, unspecified: Secondary | ICD-10-CM | POA: Diagnosis not present

## 2020-03-31 DIAGNOSIS — Z5321 Procedure and treatment not carried out due to patient leaving prior to being seen by health care provider: Secondary | ICD-10-CM | POA: Insufficient documentation

## 2020-03-31 DIAGNOSIS — R0789 Other chest pain: Secondary | ICD-10-CM | POA: Diagnosis not present

## 2020-03-31 DIAGNOSIS — R001 Bradycardia, unspecified: Secondary | ICD-10-CM | POA: Diagnosis not present

## 2020-03-31 DIAGNOSIS — R0902 Hypoxemia: Secondary | ICD-10-CM | POA: Diagnosis not present

## 2020-03-31 LAB — CBC
HCT: 35.4 % — ABNORMAL LOW (ref 36.0–46.0)
Hemoglobin: 10.8 g/dL — ABNORMAL LOW (ref 12.0–15.0)
MCH: 29.6 pg (ref 26.0–34.0)
MCHC: 30.5 g/dL (ref 30.0–36.0)
MCV: 97 fL (ref 80.0–100.0)
Platelets: 360 10*3/uL (ref 150–400)
RBC: 3.65 MIL/uL — ABNORMAL LOW (ref 3.87–5.11)
RDW: 15.7 % — ABNORMAL HIGH (ref 11.5–15.5)
WBC: 8.3 10*3/uL (ref 4.0–10.5)
nRBC: 0 % (ref 0.0–0.2)

## 2020-03-31 LAB — BASIC METABOLIC PANEL
Anion gap: 7 (ref 5–15)
BUN: 22 mg/dL (ref 8–23)
CO2: 29 mmol/L (ref 22–32)
Calcium: 9 mg/dL (ref 8.9–10.3)
Chloride: 105 mmol/L (ref 98–111)
Creatinine, Ser: 1.61 mg/dL — ABNORMAL HIGH (ref 0.44–1.00)
GFR calc Af Amer: 38 mL/min — ABNORMAL LOW (ref >60)
GFR calc non Af Amer: 33 mL/min — ABNORMAL LOW (ref >60)
Glucose, Bld: 119 mg/dL — ABNORMAL HIGH (ref 70–99)
Potassium: 4.8 mmol/L (ref 3.5–5.1)
Sodium: 141 mmol/L (ref 135–145)

## 2020-03-31 MED ORDER — SODIUM CHLORIDE 0.9% FLUSH: 3.0000 mL | Freq: Once | INTRAVENOUS | Status: DC

## 2020-03-31 NOTE — ED Notes (Signed)
Pt called for vitals, no answer x1 

## 2020-03-31 NOTE — ED Notes (Signed)
Last call for pt. No response.

## 2020-03-31 NOTE — ED Triage Notes (Signed)
Pt bib [facility] from home w/ sudden onset of chest pain w/ nausea. Pt received 325mg  aspirin w/ ems and 4 mg IM zofran w/ relief of nausea. 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 EMS VSS. Pt denie SOB

## 2020-03-31 NOTE — ED Notes (Signed)
Pt called for vitals, no answer x2

## 2020-04-04 ENCOUNTER — Ambulatory Visit (INDEPENDENT_AMBULATORY_CARE_PROVIDER_SITE_OTHER): Payer: Medicare HMO | Admitting: Surgery

## 2020-04-04 ENCOUNTER — Encounter: Payer: Self-pay | Admitting: Surgery

## 2020-04-04 ENCOUNTER — Other Ambulatory Visit: Payer: Self-pay

## 2020-04-04 ENCOUNTER — Ambulatory Visit: Admission: RE | Admit: 2020-04-04 | Payer: Medicare HMO | Source: Ambulatory Visit | Admitting: *Deleted

## 2020-04-04 VITALS — BP 114/74 | HR 63 | Temp 97.2°F | Ht 63.0 in | Wt 140.2 lb

## 2020-04-04 DIAGNOSIS — R1084 Generalized abdominal pain: Secondary | ICD-10-CM

## 2020-04-04 MED ORDER — PREDNISONE 50 MG PO TABS: ORAL_TABLET | ORAL | 0 refills | Status: DC

## 2020-04-04 MED ORDER — DIPHENHYDRAMINE HCL 50 MG PO TABS: ORAL_TABLET | ORAL | 0 refills | Status: DC

## 2020-04-04 NOTE — Patient Instructions (Signed)
Patient has been scheduled for a CT abdomen/pelvis with contrast at [place] on [facility] on 04/12/20 at 2:00pm with arrival time at 1:30pm. Prep: NPO 4 hours prior and pick up prep kit. Patient verbalizes understanding.  Patient will have two prescriptions sent to pharmacy that she will read directions on how to take the medication prior to scan. If you have any questions, please give our office a call.

## 2020-04-04 NOTE — Progress Notes (Signed)
Outpatient Surgical Follow Up  04/04/2020  Julie Acevedo is an 69 y.o. female.   Chief Complaint  Patient presents with  . Follow-up    follow up XI ROBOTIC ASSISTED VENTRAL HERNIA N/A General    HPI: Julie Acevedo is a 69 year old female with a history of gastric bypass several years ago and ventral hernia repair with mesh by me 4 months ago.  Did well postoperatively now started complaining of increasing eructation, early satiety and epigastric pain.  No vomiting no fevers no chills pain is different that the one that she had from a previous hernia.  I have personally reviewed the CT scan and at that time 6 months ago there was no evidence of complications related to the gastric bypass and there was evidence of a ventral hernia at that we have fixed since  Past Medical History:  Diagnosis Date  . Anemia   . Arthritis   . COVID-19 11/19/2019  . Diverticulitis   . DM (diabetes mellitus) ([place])    typ e 2  . History of methicillin resistant staphylococcus aureus (MRSA) 2017  . HTN (hypertension)   . Hyperlipidemia   . Memory difficulties 09/01/2015  . Multiple thyroid nodules   . Myocardial infarction ([place]) 1995  . Short-term memory loss     Past Surgical History:  Procedure Laterality Date  . ABDOMINAL HYSTERECTOMY     due to endometriosis-1 ovary left  . BREAST EXCISIONAL BIOPSY Right 1971   neg  . CARDIAC CATHETERIZATION  2000   no significant CAD per note of Dr Caryl Comes in 2013   . CHOLECYSTECTOMY    . CYSTOCELE REPAIR N/A 03/19/2017   Procedure: ANTERIOR REPAIR (CYSTOCELE);  Surgeon: Bjorn Loser, MD;  [facility];  Service: Urology;  Laterality: N/A;  . CYSTOSCOPY N/A 03/19/2017   Procedure: CYSTOSCOPY;  Surgeon: Bjorn Loser, MD;  [facility];  Service: Urology;  Laterality: N/A;  . EYE SURGERY    . FOOT SURGERY    . HAND SURGERY    . HEMICOLECTOMY    . INSERTION OF MESH N/A 08/21/2016   Procedure: INSERTION OF MESH;  Surgeon: Excell Seltzer, MD;  [facility];  Service: General;  Laterality: N/A;  . INSERTION OF MESH N/A 12/08/2019   Procedure: INSERTION OF MESH;  Surgeon: Jules Husbands, MD;  Location: [facility];  Service: General;  Laterality: N/A;  . LAPAROSCOPIC LYSIS OF ADHESIONS N/A 08/21/2016   Procedure: LAPAROSCOPIC LYSIS OF ADHESIONS;  Surgeon: Excell Seltzer, MD;  [facility];  Service: General;  Laterality: N/A;  . LAPAROSCOPIC REMOVAL ABDOMINAL MASS    . LAPAROTOMY N/A 04/16/2019   Procedure: EXPLORATORY LAPAROTOMY;  Surgeon: Benjamine Sprague, DO;  Location: [facility];  Service: General;  Laterality: N/A;  . LOOP RECORDER INSERTION N/A 03/21/2018   Procedure: LOOP RECORDER INSERTION;  Surgeon: Sanda Klein, MD;  Location: [place] [facility];  Service: Cardiovascular;  Laterality: N/A;  . LUMBAR [place]    . ROUX-EN-Y GASTRIC BYPASS  2010   Dr Hassell Done  . TONSILLECTOMY    . UPPER GI ENDOSCOPY  01/12/16   normal larynx, normal esophagus. gastric bypass with a normal-sized pouch and intact staple line, normal examined jejunum, otherwise exam was normal  . VENTRAL HERNIA REPAIR N/A 08/21/2016   Procedure: LAPAROSCOPIC VENTRAL HERNIA;  Surgeon: Excell Seltzer, MD;  [facility];  Service: General;  Laterality: N/A;  . XI ROBOTIC ASSISTED VENTRAL HERNIA N/A 12/08/2019   Procedure: XI ROBOTIC ASSISTED VENTRAL HERNIA;  Surgeon: Jules Husbands, MD;  Location: [facility];  Service: General;  Laterality: N/A;    Family History  Problem Relation Age of Onset  . Cancer Father 47       Lung  . Coronary artery disease Father 28  . COPD Mother   . Diabetes Mother   . Hypertension Mother   . Cancer Paternal Grandmother        Breast  . Breast cancer Paternal Grandmother   . Congestive Heart Failure Maternal Grandmother   . Emphysema Maternal Grandfather   . Heart attack Paternal Grandfather   . Cancer Paternal Aunt        Breast  . Breast cancer Paternal Aunt 17  . Breast cancer Paternal Aunt 62    Social History:  reports  that she has never smoked. She has never used smokeless tobacco. She reports that she does not drink alcohol or use drugs.  Allergies:  Allergies  Allergen Reactions  . Lotrel [Amlodipine Besy-Benazepril Hcl] Anaphylaxis  . Contrast Media [Iodinated Diagnostic Agents] Swelling and Other (See Comments)    Reaction:  Neck swelling   . Niacin Hives  . Sulfa Antibiotics Hives    Medications reviewed.    ROS Full ROS performed and is otherwise negative other than what is stated in HPI   BP 114/74   Pulse 63   Temp (!) 97.2 F (36.2 C) (Temporal)   Ht 5\' 3"  (1.6 m)   Wt 140 lb 3.2 oz (63.6 kg)   SpO2 97%   BMI 24.84 kg/m   Physical Exam Vitals and nursing note reviewed. Exam conducted with a chaperone present.  Constitutional:      General: She is not in acute distress.    Appearance: Normal appearance. She is normal weight.  Eyes:     General: No scleral icterus.       Right eye: No discharge.        Left eye: No discharge.  Cardiovascular:     Rate and Rhythm: Normal rate and regular rhythm.     Heart sounds: No murmur.  Pulmonary:     Effort: Pulmonary effort is normal. No respiratory distress.     Breath sounds: Normal breath sounds. No stridor.  Abdominal:     General: Abdomen is flat. There is no distension.     Palpations: Abdomen is soft. There is no mass.     Tenderness: There is abdominal tenderness.     Comments: Prior robotic ports healed completely.  No evidence of infection no evidence of hernia recurrence.  There is some mild epigastric pain.  No peritonitis  Musculoskeletal:     Cervical back: Normal range of motion and neck supple. No rigidity.  Lymphadenopathy:     Cervical: No cervical adenopathy.  Skin:    General: Skin is warm and dry.     Capillary Refill: Capillary refill takes less than 2 seconds.  Neurological:     General: No focal deficit present.     Mental Status: She is alert and oriented to person, place, and time.  Psychiatric:         Mood and Affect: Mood normal.        Behavior: Behavior normal.        Thought Content: Thought content normal.        Judgment: Judgment normal.       Assessment/Plan:  New epigastric pain with burping and dyspepsia.  There is no evidence of hernia recurrence or complications related to  hernia surgery.  I am however concerned about potential complications related to her gastric bypass several years ago.  We will start work-up with a CT scan of the abdomen and pelvis and she will likely require endoscopic evaluation to rule out any potential marginal ulcers at the gastrojejunostomy. No need for surgical intervention at this time.  Greater than 50% of the 25 minutes  visit was spent in counseling/coordination of care   Caroleen Hamman, MD [facility]

## 2020-04-12 ENCOUNTER — Ambulatory Visit
Admission: RE | Admit: 2020-04-12 | Discharge: 2020-04-12 | Disposition: A | Discharge status: Home / Self Care | Payer: Medicare HMO | Source: Ambulatory Visit | Attending: Surgery | Admitting: Surgery

## 2020-04-12 ENCOUNTER — Other Ambulatory Visit: Payer: Self-pay

## 2020-04-12 DIAGNOSIS — R1084 Generalized abdominal pain: Secondary | ICD-10-CM | POA: Insufficient documentation

## 2020-04-12 DIAGNOSIS — R109 Unspecified abdominal pain: Secondary | ICD-10-CM | POA: Diagnosis not present

## 2020-04-12 MED ORDER — IOHEXOL 300 MG/ML  SOLN
75.0000 mL | Freq: Once | INTRAMUSCULAR | Status: AC | PRN
  Administered 2020-04-12: 75 mL via INTRAVENOUS

## 2020-04-13 ENCOUNTER — Telehealth: Payer: Self-pay

## 2020-04-13 NOTE — Telephone Encounter (Signed)
-----   Message from Jules Husbands, MD sent at 04/13/2020  9:29 AM EDT ----- Please let her know CT did not show anything alarming. She will need a colonoscopy but I would like to talk to her further. No hernias ----- Message ----- From: Interface, Rad Results In Sent: 04/13/2020   8:39 AM EDT To: Jules Husbands, MD

## 2020-04-13 NOTE — Telephone Encounter (Signed)
Spoke with patient and notified her of Dr.Pabon's recommendations and patient is scheduled for follow up appointment on 04/18/20 at 11:15am to further discuss CT results per Dr.Pabon. Patient verbalizes understanding and has no further questions.

## 2020-04-14 LAB — CUP PACEART REMOTE DEVICE CHECK
Date Time Interrogation Session: 20210428232644
Implantable Pulse Generator Implant Date: 20190405

## 2020-04-18 ENCOUNTER — Ambulatory Visit (INDEPENDENT_AMBULATORY_CARE_PROVIDER_SITE_OTHER): Payer: Medicare HMO | Admitting: Surgery

## 2020-04-18 ENCOUNTER — Ambulatory Visit (INDEPENDENT_AMBULATORY_CARE_PROVIDER_SITE_OTHER): Payer: Medicare HMO | Admitting: *Deleted

## 2020-04-18 ENCOUNTER — Other Ambulatory Visit: Payer: Self-pay

## 2020-04-18 ENCOUNTER — Encounter: Payer: Self-pay | Admitting: Surgery

## 2020-04-18 ENCOUNTER — Ambulatory Visit: Payer: Medicare HMO | Admitting: Surgery

## 2020-04-18 VITALS — BP 118/74 | HR 66 | Temp 97.7°F | Resp 12 | Wt 135.4 lb

## 2020-04-18 DIAGNOSIS — R55 Syncope and collapse: Secondary | ICD-10-CM | POA: Diagnosis not present

## 2020-04-18 DIAGNOSIS — R1084 Generalized abdominal pain: Secondary | ICD-10-CM | POA: Diagnosis not present

## 2020-04-18 NOTE — Patient Instructions (Addendum)
We have placed a referral to [facility] to have a Colonscopy and Esophagogastroduodenoscopy. Their office will contact you to schedule this. If you do not hear from their office please call our office.   Follow up as needed. Please call the office if you have any questions or concerns.

## 2020-04-18 NOTE — Progress Notes (Signed)
Outpatient Surgical Follow Up  04/18/2020  Julie Acevedo is an 69 y.o. female.   Chief Complaint  Patient presents with  . Follow-up     discuss CT scan results 04/12/20 - Dr Dahlia Byes    HPI: Julie Acevedo is a 69 year old female well-known to me with a prior ventral hernia and now epigastric pain and early satiety.  She does have a history of a prior gastric bypass. 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  There is no evidence of any internal hernias or any bowel obstruction.  The hernia repair is intact without evidence of complications.  Past Medical History:  Diagnosis Date  . Anemia   . Arthritis   . COVID-19 11/19/2019  . Diverticulitis   . DM (diabetes mellitus) ([place])    typ e 2  . History of methicillin resistant staphylococcus aureus (MRSA) 2017  . HTN (hypertension)   . Hyperlipidemia   . Memory difficulties 09/01/2015  . Multiple thyroid nodules   . Myocardial infarction ([place]) 1995  . Short-term memory loss     Past Surgical History:  Procedure Laterality Date  . ABDOMINAL HYSTERECTOMY     due to endometriosis-1 ovary left  . BREAST EXCISIONAL BIOPSY Right 1971   neg  . CARDIAC CATHETERIZATION  2000   no significant CAD per note of Dr Caryl Comes in 2013   . CHOLECYSTECTOMY    . CYSTOCELE REPAIR N/A 03/19/2017   Procedure: ANTERIOR REPAIR (CYSTOCELE);  Surgeon: Bjorn Loser, MD;  [facility];  Service: Urology;  Laterality: N/A;  . CYSTOSCOPY N/A 03/19/2017   Procedure: CYSTOSCOPY;  Surgeon: Bjorn Loser, MD;  [facility];  Service: Urology;  Laterality: N/A;  . EYE SURGERY    . FOOT SURGERY    . HAND SURGERY    . HEMICOLECTOMY    . INSERTION OF MESH N/A 08/21/2016   Procedure: INSERTION OF MESH;  Surgeon: Excell Seltzer, MD;  [facility];  Service: General;  Laterality: N/A;  . INSERTION OF MESH N/A 12/08/2019   Procedure: INSERTION OF MESH;   Surgeon: Jules Husbands, MD;  Location: [facility];  Service: General;  Laterality: N/A;  . LAPAROSCOPIC LYSIS OF ADHESIONS N/A 08/21/2016   Procedure: LAPAROSCOPIC LYSIS OF ADHESIONS;  Surgeon: Excell Seltzer, MD;  [facility];  Service: General;  Laterality: N/A;  . LAPAROSCOPIC REMOVAL ABDOMINAL MASS    . LAPAROTOMY N/A 04/16/2019   Procedure: EXPLORATORY LAPAROTOMY;  Surgeon: Benjamine Sprague, DO;  Location: [facility];  Service: General;  Laterality: N/A;  . LOOP RECORDER INSERTION N/A 03/21/2018   Procedure: LOOP RECORDER INSERTION;  Surgeon: Sanda Klein, MD;  Location: [place] [facility];  Service: Cardiovascular;  Laterality: N/A;  . LUMBAR [place]    . ROUX-EN-Y GASTRIC BYPASS  2010   Dr Hassell Done  . TONSILLECTOMY    . UPPER GI ENDOSCOPY  01/12/16   normal larynx, normal esophagus. gastric bypass with a normal-sized pouch and intact staple line, normal examined jejunum, otherwise exam was normal  . VENTRAL HERNIA REPAIR N/A 08/21/2016   Procedure: LAPAROSCOPIC VENTRAL HERNIA;  Surgeon: Excell Seltzer, MD;  [facility];  Service: General;  Laterality: N/A;  . XI ROBOTIC ASSISTED VENTRAL HERNIA N/A 12/08/2019   Procedure: XI ROBOTIC ASSISTED VENTRAL HERNIA;  Surgeon: Jules Husbands, MD;  Location: [facility];  Service: General;  Laterality: N/A;    Family History  Problem  Relation Age of Onset  . Cancer Father 50       Lung  . Coronary artery disease Father 55  . COPD Mother   . Diabetes Mother   . Hypertension Mother   . Cancer Paternal Grandmother        Breast  . Breast cancer Paternal Grandmother   . Congestive Heart Failure Maternal Grandmother   . Emphysema Maternal Grandfather   . Heart attack Paternal Grandfather   . Cancer Paternal Aunt        Breast  . Breast cancer Paternal Aunt 78  . Breast cancer Paternal Aunt 28    Social History:  reports that she has never smoked. She has never used smokeless tobacco. She reports that she does not drink alcohol or  use drugs.  Allergies:  Allergies  Allergen Reactions  . Lotrel [Amlodipine Besy-Benazepril Hcl] Anaphylaxis  . Contrast Media [Iodinated Diagnostic Agents] Swelling and Other (See Comments)    Reaction:  Neck swelling   . Niacin Hives  . Sulfa Antibiotics Hives    Medications reviewed.    ROS Full ROS performed and is otherwise negative other than what is stated in HPI   BP 118/74   Pulse 66   Temp 97.7 F (36.5 C)   Resp 12   Wt 135 lb 6.4 oz (61.4 kg)   SpO2 98%   BMI 23.99 kg/m   Physical Exam Vitals and nursing note reviewed. Exam conducted with a chaperone present.  Constitutional:      Appearance: Normal appearance.  Pulmonary:     Effort: Pulmonary effort is normal. No respiratory distress.  Abdominal:     General: Abdomen is flat. There is no distension.     Palpations: There is no mass.     Tenderness: There is no abdominal tenderness.     Hernia: No hernia is present.  Musculoskeletal:        General: No swelling or tenderness. Normal range of motion.  Skin:    General: Skin is warm and dry.     Capillary Refill: Capillary refill takes less than 2 seconds.  Neurological:     General: No focal deficit present.     Mental Status: She is alert and oriented to person, place, and time.  Psychiatric:        Mood and Affect: Mood normal.        Behavior: Behavior normal.        Thought Content: Thought content normal.        Judgment: Judgment normal.       Assessment/Plan: Epigastric abdominal pain after gastric bypass and multiple abdominal operations.  Given the unusual findings on the CT I agree with the radiologist to further perform an endoscopic evaluation.  Since she had epigastric pain I do think it is prudent to also perform an EGD and colonoscopy during the same setting.  I will for her to Dr. Vicente Males to complete the endoscopic  Evaluation. There is no evidence of any complications related to my repair.   Greater than 50% of the 25 minutes   visit was spent in counseling/coordination of care   Caroleen Hamman, MD [facility]

## 2020-04-18 NOTE — Progress Notes (Signed)
[facility] Summary Report / Loop Recorder 

## 2020-04-20 ENCOUNTER — Telehealth: Payer: Self-pay

## 2020-04-20 NOTE — Telephone Encounter (Signed)
Left message for patient to inform of disconnected monitor. 

## 2020-04-26 NOTE — Progress Notes (Signed)
Trena Platt Cummings,acting as a scribe for [name], MD.,have documented all relevant documentation on the behalf of [name], MD,as directed by  [name], MD while in the presence of [name], MD.   Established patient visit   Patient: Julie Acevedo   DOB: 1951/06/08   69 y.o. Female  MRN: [id number] Visit Date: 04/28/2020  Today's healthcare provider: [name], MD   Chief Complaint  Patient presents with  . Neck Pain   Subjective    Neck Pain  This is a new problem. The current episode started more than 1 month ago. The problem has been unchanged. The pain is associated with nothing. The quality of the pain is described as aching. The pain is at a severity of 4/10. The symptoms are aggravated by twisting and bending. The pain is same all the time. Associated symptoms include headaches. Pertinent negatives include no chest pain, fever, leg pain, numbness, syncope, visual change or weakness. She has tried heat for the symptoms. The treatment provided no relief.   Patient also has a knot on the left side of her neck.  Trapezius region.  She is also having belching and heartburn.  No chest pain and no exertional dyspnea.      Medications: Outpatient Medications Prior to Visit  Medication Sig  . diclofenac Sodium (VOLTAREN) 1 % GEL Voltaren 1 % topical gel  . gabapentin (NEURONTIN) 100 MG capsule TAKE 2 CAPSULES (200 MG TOTAL) BY MOUTH 3 (THREE) TIMES DAILY. (Patient taking differently: Take 200 mg by mouth 2 (two) times daily. )  . hydrochlorothiazide (HYDRODIURIL) 25 MG tablet Take 1 tablet (25 mg total) by mouth daily.  Marland Kitchen LINZESS 290 MCG CAPS capsule TAKE 1 CAPSULE (290 MCG TOTAL) BY MOUTH DAILY BEFORE BREAKFAST.  . meloxicam (MOBIC) 15 MG tablet Take 15 mg by mouth daily.   . metFORMIN (GLUCOPHAGE) 500 MG tablet TAKE 1 TABLET (500 MG TOTAL) BY MOUTH 2 (TWO) TIMES DAILY WITH A MEAL. (Patient taking differently: Take 500 mg by mouth  daily with breakfast. )  . methocarbamol (ROBAXIN) 500 MG tablet Take 500 mg by mouth 2 (two) times daily. Back pain  . metoprolol succinate (TOPROL-XL) 25 MG 24 hr tablet Take 0.5 tablets (12.5 mg total) by mouth daily. (Patient taking differently: Take 25 mg by mouth daily. )  . ondansetron (ZOFRAN ODT) 4 MG disintegrating tablet Take 1 tablet (4 mg total) by mouth every 8 (eight) hours as needed for nausea or vomiting. (Patient taking differently: Take 4 mg by mouth every 8 (eight) hours as needed for nausea or vomiting. )  . ondansetron (ZOFRAN) 4 MG tablet Take 4 mg by mouth every 8 (eight) hours as needed.  . traMADol (ULTRAM) 50 MG tablet Take 1 tablet (50 mg total) by mouth every 6 (six) hours as needed.  . traZODone (DESYREL) 50 MG tablet TAKE 1 TABLET (50 MG TOTAL) BY MOUTH AT BEDTIME AS NEEDED FOR SLEEP. (Patient taking differently: Take 50 mg by mouth at bedtime as needed for sleep. )  . Vitamin D, Ergocalciferol, (DRISDOL) 1.25 MG (50000 UT) CAPS capsule TAKE 1 CAPSULE (50,000 UNITS TOTAL) BY MOUTH EVERY 7 (SEVEN) DAYS.  Marland Kitchen diphenhydrAMINE (BENADRYL) 50 MG tablet Patient is to take one tablet 1 hour before test. (Patient not taking: Reported on 04/28/2020)  . predniSONE (DELTASONE) 50 MG tablet Patient is to take one tablet 13 hours before test. Patient is to take one tablet 7 hours before  test. Patient is to take one tablet 1 hour before the test. (Patient not taking: Reported on 04/28/2020)   No facility-administered medications prior to visit.    Review of Systems  Constitutional: Negative for appetite change, chills, fatigue and fever.  Eyes: Negative.   Respiratory: Negative for chest tightness and shortness of breath.   Cardiovascular: Negative for chest pain, palpitations and syncope.  Gastrointestinal: Negative for abdominal pain, nausea and vomiting.  Endocrine: Negative.   Musculoskeletal: Positive for neck pain.  Allergic/Immunologic: Negative.   Neurological: Positive for  headaches. Negative for dizziness, weakness and numbness.  Psychiatric/Behavioral: Negative.        Objective    BP 111/72 (BP Location: Right Arm, Patient Position: Sitting, Cuff Size: Large)   Pulse 64   Temp (!) 96.8 F (36 C) (Temporal)   Ht 5\' 3"  (1.6 m)   Wt 137 lb 9.6 oz (62.4 kg)   BMI 24.37 kg/m     Physical Exam Vitals reviewed.  Constitutional:      General: She is not in acute distress.    Appearance: Normal appearance. She is well-developed and normal weight. She is not ill-appearing or diaphoretic.  HENT:     Head: Normocephalic and atraumatic.  Eyes:     General: No scleral icterus.    Extraocular Movements: Extraocular movements intact.     Conjunctiva/sclera: Conjunctivae normal.     Pupils: Pupils are equal, round, and reactive to light.  Neck:     Thyroid: No thyromegaly.     Vascular: No JVD.     Trachea: No tracheal deviation.     Comments: Some tenderness over left carotid sinus. Cardiovascular:     Rate and Rhythm: Regular rhythm. Bradycardia present.     Pulses: Normal pulses.     Heart sounds: Normal heart sounds. No murmur. No friction rub. No gallop.   Pulmonary:     Effort: Pulmonary effort is normal. No respiratory distress.     Breath sounds: Normal breath sounds. No wheezing or rales.  Musculoskeletal:     Cervical back: Normal range of motion and neck supple.     Comments: Tenderness over left trapezius.  No point tenderness over C-spine.  Lymphadenopathy:     Cervical: No cervical adenopathy.  Skin:    General: Skin is warm and dry.     Capillary Refill: Capillary refill takes less than 2 seconds.  Neurological:     General: No focal deficit present.     Mental Status: She is alert and oriented to person, place, and time. Mental status is at baseline.     Cranial Nerves: No cranial nerve deficit.     Motor: No weakness.     Gait: Gait normal.     Comments: Grossly nonfocal exam both upper extremities.  Psychiatric:        Mood  and Affect: Mood normal.        Behavior: Behavior normal.        Thought Content: Thought content normal.        Judgment: Judgment normal.       No results found for any visits on 04/28/20.  Assessment & Plan    1. Neck pain Tender over left carotid sinus Muscle tenderness in left trapezium Xray of cervical spine ordered - DG Cervical Spine Complete  2. Gastroesophageal reflux disease, unspecified whether esophagitis present Uncontrolled  Started on Omeprazole 20 mg.  - omeprazole (PRILOSEC) 20 MG capsule; Take 1 capsule (20 mg total) by mouth  daily.  Dispense: 30 capsule; Refill: 3 3.  Type 2 diabetes with nephropathy 4.  Adjustment reaction with mild chronic anxiety Return in about 2 weeks (around 05/12/2020).      I, [name], MD, have reviewed all documentation for this visit. The documentation on 05/02/20 for the exam, diagnosis, procedures, and orders are all accurate and complete.    Baillie Mohammad Cranford Mon, MD  [facility] [phone] (phone) [phone] (fax)  [place]

## 2020-04-28 ENCOUNTER — Ambulatory Visit
Admission: RE | Admit: 2020-04-28 | Discharge: 2020-04-28 | Disposition: A | Discharge status: Home / Self Care | Payer: Medicare HMO | Source: Ambulatory Visit | Attending: Family Medicine | Admitting: Family Medicine

## 2020-04-28 ENCOUNTER — Other Ambulatory Visit: Payer: Self-pay

## 2020-04-28 ENCOUNTER — Ambulatory Visit (INDEPENDENT_AMBULATORY_CARE_PROVIDER_SITE_OTHER): Payer: Medicare HMO | Admitting: Family Medicine

## 2020-04-28 ENCOUNTER — Encounter: Payer: Self-pay | Admitting: Family Medicine

## 2020-04-28 VITALS — BP 111/72 | HR 64 | Temp 96.8°F | Ht 63.0 in | Wt 137.6 lb

## 2020-04-28 DIAGNOSIS — M542 Cervicalgia: Secondary | ICD-10-CM | POA: Insufficient documentation

## 2020-04-28 DIAGNOSIS — F4323 Adjustment disorder with mixed anxiety and depressed mood: Secondary | ICD-10-CM | POA: Diagnosis not present

## 2020-04-28 DIAGNOSIS — N182 Chronic kidney disease, stage 2 (mild): Secondary | ICD-10-CM | POA: Diagnosis not present

## 2020-04-28 DIAGNOSIS — E1122 Type 2 diabetes mellitus with diabetic chronic kidney disease: Secondary | ICD-10-CM

## 2020-04-28 DIAGNOSIS — K219 Gastro-esophageal reflux disease without esophagitis: Secondary | ICD-10-CM | POA: Diagnosis not present

## 2020-04-28 MED ORDER — OMEPRAZOLE 20 MG PO CPDR: 20.0000 mg | DELAYED_RELEASE_CAPSULE | Freq: Every day | ORAL | 3 refills | Status: DC

## 2020-04-28 NOTE — Patient Instructions (Signed)
Continue to use heating pad and muscle relaxers for neck pain.

## 2020-05-02 ENCOUNTER — Other Ambulatory Visit: Payer: Self-pay | Admitting: Family Medicine

## 2020-05-02 DIAGNOSIS — K219 Gastro-esophageal reflux disease without esophagitis: Secondary | ICD-10-CM

## 2020-05-06 NOTE — Progress Notes (Signed)
Trena Platt Kotaro Buer,acting as a scribe for [name], MD.,have documented all relevant documentation on the behalf of [name], MD,as directed by  [name], MD while in the presence of [name], MD.  Established patient visit   Patient: Julie Acevedo   DOB: 1951/10/13   69 y.o. Female  MRN: [id number] Visit Date: 05/12/2020  Today's healthcare provider: [name], MD   Chief Complaint  Patient presents with  . Neck Pain   Subjective    HPI   Patient presents today for a 2 week follow up on neck pain and Gerd. Patient says that her neck pain has stopped and that her omeprazole has been helping with her GERD.  She states she feels much better.  Neck pain From 04/28/2020-Tender over left carotid sinus. Muscle tenderness in left trapezium. Xray of cervical spine obtained showing-Degenerative disc disease. No fractures.  Gastroesophageal reflux disease, unspecified whether esophagitis present From 04/28/2020-Uncontrolled. Started on Omeprazole 20 mg.   Adjustment reaction with mild chronic anxiety From 04/28/2020-Return in about 2 weeks (around 05/12/2020).          Medications: Outpatient Medications Prior to Visit  Medication Sig  . diclofenac Sodium (VOLTAREN) 1 % GEL Voltaren 1 % topical gel  . gabapentin (NEURONTIN) 100 MG capsule Take 100 mg by mouth 2 (two) times daily.  . hydrochlorothiazide (HYDRODIURIL) 25 MG tablet Take 1 tablet (25 mg total) by mouth daily.  Marland Kitchen LINZESS 290 MCG CAPS capsule TAKE 1 CAPSULE (290 MCG TOTAL) BY MOUTH DAILY BEFORE BREAKFAST.  . meloxicam (MOBIC) 15 MG tablet Take 15 mg by mouth daily.   . metFORMIN (GLUCOPHAGE) 500 MG tablet TAKE 1 TABLET (500 MG TOTAL) BY MOUTH 2 (TWO) TIMES DAILY WITH A MEAL.  . methocarbamol (ROBAXIN) 500 MG tablet Take 500 mg by mouth 2 (two) times daily. Back pain  . metoprolol succinate (TOPROL-XL) 25 MG 24 hr tablet Take 25 mg by mouth daily.  Marland Kitchen omeprazole (PRILOSEC)  20 MG capsule Take 1 capsule (20 mg total) by mouth daily.  . ondansetron (ZOFRAN ODT) 4 MG disintegrating tablet Take 1 tablet (4 mg total) by mouth every 8 (eight) hours as needed for nausea or vomiting. (Patient taking differently: Take 4 mg by mouth every 8 (eight) hours as needed for nausea or vomiting. )  . ondansetron (ZOFRAN) 4 MG tablet Take 4 mg by mouth every 8 (eight) hours as needed.  . traMADol (ULTRAM) 50 MG tablet Take 1 tablet (50 mg total) by mouth every 6 (six) hours as needed.  . traZODone (DESYREL) 50 MG tablet TAKE 1 TABLET (50 MG TOTAL) BY MOUTH AT BEDTIME AS NEEDED FOR SLEEP.  . Vitamin D, Ergocalciferol, (DRISDOL) 1.25 MG (50000 UT) CAPS capsule TAKE 1 CAPSULE (50,000 UNITS TOTAL) BY MOUTH EVERY 7 (SEVEN) DAYS.  Marland Kitchen diphenhydrAMINE (BENADRYL) 50 MG tablet Patient is to take one tablet 1 hour before test. (Patient not taking: Reported on 04/28/2020)  . [DISCONTINUED] predniSONE (DELTASONE) 50 MG tablet Patient is to take one tablet 13 hours before test. Patient is to take one tablet 7 hours before test. Patient is to take one tablet 1 hour before the test. (Patient not taking: Reported on 04/28/2020)   No facility-administered medications prior to visit.    Review of Systems  Constitutional: Negative.  Negative for appetite change, chills, fatigue and fever.  HENT: Negative.   Eyes: Negative.   Respiratory: Negative.  Negative for chest tightness and shortness of  breath.   Cardiovascular: Negative for chest pain and palpitations.  Gastrointestinal: Negative.  Negative for abdominal pain, nausea and vomiting.  Endocrine: Negative.   Genitourinary: Negative.   Musculoskeletal: Negative.   Allergic/Immunologic: Negative.   Neurological: Negative for dizziness and weakness.  Hematological: Negative.   Psychiatric/Behavioral: Negative.        Objective    BP 110/68 (BP Location: Right Arm, Patient Position: Sitting, Cuff Size: Normal)   Pulse (!) 56   Temp (!) 97.1 F  (36.2 C) (Temporal)   Ht 5\' 3"  (1.6 m)   Wt 137 lb 12.8 oz (62.5 kg)   BMI 24.41 kg/m     Physical Exam Vitals reviewed.  Constitutional:      Appearance: Normal appearance.  HENT:     Head: Normocephalic and atraumatic.     Right Ear: External ear normal.     Left Ear: External ear normal.  Eyes:     General: No scleral icterus.    Conjunctiva/sclera: Conjunctivae normal.  Cardiovascular:     Rate and Rhythm: Normal rate and regular rhythm.     Pulses: Normal pulses.     Heart sounds: Normal heart sounds.  Pulmonary:     Effort: Pulmonary effort is normal.     Breath sounds: Normal breath sounds.  Musculoskeletal:     Right lower leg: No edema.     Left lower leg: No edema.  Skin:    General: Skin is warm and dry.  Neurological:     General: No focal deficit present.     Mental Status: She is alert and oriented to person, place, and time.  Psychiatric:        Mood and Affect: Mood normal.        Behavior: Behavior normal.        Thought Content: Thought content normal.        Judgment: Judgment normal.       No results found for any visits on 05/12/20.  Assessment & Plan     1. Gastroesophageal reflux disease, unspecified whether esophagitis present Stop meloxicam. Will discuss omeprazole on next visit. 2. Neck pain Resolved.  3. Adjustment disorder with mixed anxiety and depressed mood Under good control.  No follow-ups on file.      I, [name], MD, have reviewed all documentation for this visit. The documentation on 05/15/20 for the exam, diagnosis, procedures, and orders are all accurate and complete.    Richard Cranford Mon, MD  [facility] [phone] (phone) [phone] (fax)  [place]

## 2020-05-07 NOTE — Progress Notes (Signed)
[place]  [phone] [phone]) [phone]  ID: Julie Acevedo OB: [birth year]  MR#: [id number]  [id number]  Patient Care Team: Jerrol Banana., MD as PCP - General (Family Medicine) Yolonda Kida, MD as PCP - Cardiology (Cardiology) Lorelee Cover., MD as Consulting Physician (Ophthalmology) Bjorn Loser, MD as Consulting Physician (Urology) Bary Castilla, Forest Gleason, MD as Consulting Physician (General Surgery) Jules Husbands, MD as Consulting Physician (General Surgery)   CHIEF COMPLAINT: Iron deficiency anemia.  INTERVAL HISTORY: Patient returns to clinic today for repeat laboratory work, further evaluation, and consideration of additional IV Feraheme.  She has had a poor appetite and abdominal pain and is actively being worked up by GI, but otherwise has felt well.  She does not complain of weakness or fatigue today.  She has no neurologic complaints.  She denies any recent fevers or illnesses.  She has no chest pain, cough, hemoptysis, or shortness of breath.  She denies any nausea, vomiting, constipation, or diarrhea.  She denies any melena or hematochezia.  She has no urinary complaints.  Patient offers no further specific complaints today.  REVIEW OF SYSTEMS:   Review of Systems  Constitutional: Negative.  Negative for fever, malaise/fatigue and weight loss.  Respiratory: Negative.  Negative for cough and shortness of breath.   Cardiovascular: Negative.  Negative for chest pain and leg swelling.  Gastrointestinal: Positive for abdominal pain. Negative for blood in stool and melena.  Genitourinary: Negative.  Negative for hematuria.  Musculoskeletal: Negative.  Negative for back pain.  Skin: Negative.  Negative for rash.  Neurological: Negative for dizziness, focal weakness, weakness and headaches.  Psychiatric/Behavioral: Negative.  The patient is not nervous/anxious.     As per HPI. Otherwise, a complete review of systems is  negative.  PAST MEDICAL HISTORY: Past Medical History:  Diagnosis Date  . Anemia   . Arthritis   . COVID-19 11/19/2019  . Diverticulitis   . DM (diabetes mellitus) ([place])    typ e 2  . History of methicillin resistant staphylococcus aureus (MRSA) 2017  . HTN (hypertension)   . Hyperlipidemia   . Memory difficulties 09/01/2015  . Multiple thyroid nodules   . Myocardial infarction ([place]) 1995  . Short-term memory loss     PAST SURGICAL HISTORY: Past Surgical History:  Procedure Laterality Date  . ABDOMINAL HYSTERECTOMY     due to endometriosis-1 ovary left  . BREAST EXCISIONAL BIOPSY Right 1971   neg  . CARDIAC CATHETERIZATION  2000   no significant CAD per note of Dr Caryl Comes in 2013   . CHOLECYSTECTOMY    . CYSTOCELE REPAIR N/A 03/19/2017   Procedure: ANTERIOR REPAIR (CYSTOCELE);  Surgeon: Bjorn Loser, MD;  Location: [facility];  Service: Urology;  Laterality: N/A;  . CYSTOSCOPY N/A 03/19/2017   Procedure: CYSTOSCOPY;  Surgeon: Bjorn Loser, MD;  Location: [facility];  Service: Urology;  Laterality: N/A;  . EYE SURGERY    . FOOT SURGERY    . HAND SURGERY    . HEMICOLECTOMY    . INSERTION OF MESH N/A 08/21/2016   Procedure: INSERTION OF MESH;  Surgeon: Excell Seltzer, MD;  Location: [facility];  Service: General;  Laterality: N/A;  . INSERTION OF MESH N/A 12/08/2019   Procedure: INSERTION OF MESH;  Surgeon: Jules Husbands, MD;  Location: [facility];  Service: General;  Laterality: N/A;  . LAPAROSCOPIC LYSIS OF ADHESIONS N/A 08/21/2016   Procedure: LAPAROSCOPIC LYSIS OF ADHESIONS;  Surgeon: Excell Seltzer, MD;  Location:  [facility];  Service: General;  Laterality: N/A;  . LAPAROSCOPIC REMOVAL ABDOMINAL MASS    . LAPAROTOMY N/A 04/16/2019   Procedure: EXPLORATORY LAPAROTOMY;  Surgeon: Benjamine Sprague, DO;  Location: [facility];  Service: General;  Laterality: N/A;  . LOOP RECORDER INSERTION N/A 03/21/2018   Procedure: LOOP RECORDER INSERTION;  Surgeon: Sanda Klein, MD;  Location: [facility];  Service: Cardiovascular;  Laterality: N/A;  . LUMBAR [place]    . ROUX-EN-Y GASTRIC BYPASS  2010   Dr Hassell Done  . TONSILLECTOMY    . UPPER GI ENDOSCOPY  01/12/16   normal larynx, normal esophagus. gastric bypass with a normal-sized pouch and intact staple line, normal examined jejunum, otherwise exam was normal  . VENTRAL HERNIA REPAIR N/A 08/21/2016   Procedure: LAPAROSCOPIC VENTRAL HERNIA;  Surgeon: Excell Seltzer, MD;  Location: [facility];  Service: General;  Laterality: N/A;  . XI ROBOTIC ASSISTED VENTRAL HERNIA N/A 12/08/2019   Procedure: XI ROBOTIC ASSISTED VENTRAL HERNIA;  Surgeon: Jules Husbands, MD;  Location: [facility];  Service: General;  Laterality: N/A;    FAMILY HISTORY: Family History  Problem Relation Age of Onset  . Cancer Father 78       Lung  . Coronary artery disease Father 82  . COPD Mother   . Diabetes Mother   . Hypertension Mother   . Cancer Paternal Grandmother        Breast  . Breast cancer Paternal Grandmother   . Congestive Heart Failure Maternal Grandmother   . Emphysema Maternal Grandfather   . Heart attack Paternal Grandfather   . Cancer Paternal Aunt        Breast  . Breast cancer Paternal Aunt 26  . Breast cancer Paternal Aunt 72    ADVANCED DIRECTIVES (Y/N):  N  HEALTH MAINTENANCE: Social History   Tobacco Use  . Smoking status: Never Smoker  . Smokeless tobacco: Never Used  Substance Use Topics  . Alcohol use: No  . Drug use: No     Colonoscopy:  PAP:  Bone density:  Lipid panel:  Allergies  Allergen Reactions  . Lotrel [Amlodipine Besy-Benazepril Hcl] Anaphylaxis  . Contrast Media [Iodinated Diagnostic Agents] Swelling and Other (See Comments)    Reaction:  Neck swelling   . Niacin Hives  . Sulfa Antibiotics Hives    Current Outpatient Medications  Medication Sig Dispense Refill  . diclofenac Sodium (VOLTAREN) 1 % GEL Voltaren 1 % topical gel    . gabapentin (NEURONTIN) 100 MG capsule Take 100 mg by mouth 2  (two) times daily.    . hydrochlorothiazide (HYDRODIURIL) 25 MG tablet Take 1 tablet (25 mg total) by mouth daily. 90 tablet 3  . LINZESS 290 MCG CAPS capsule TAKE 1 CAPSULE (290 MCG TOTAL) BY MOUTH DAILY BEFORE BREAKFAST. 90 capsule 3  . meloxicam (MOBIC) 15 MG tablet Take 15 mg by mouth daily.     . metFORMIN (GLUCOPHAGE) 500 MG tablet TAKE 1 TABLET (500 MG TOTAL) BY MOUTH 2 (TWO) TIMES DAILY WITH A MEAL. 180 tablet 3  . methocarbamol (ROBAXIN) 500 MG tablet Take 500 mg by mouth 2 (two) times daily. Back pain    . metoprolol succinate (TOPROL-XL) 25 MG 24 hr tablet Take 25 mg by mouth daily.    Marland Kitchen omeprazole (PRILOSEC) 20 MG capsule Take 1 capsule (20 mg total) by mouth daily. 30 capsule 3  . ondansetron (ZOFRAN ODT) 4 MG disintegrating tablet Take 1 tablet (4 mg total) by mouth every 8 (  eight) hours as needed for nausea or vomiting. (Patient taking differently: Take 4 mg by mouth every 8 (eight) hours as needed for nausea or vomiting. ) 20 tablet 0  . ondansetron (ZOFRAN) 4 MG tablet Take 4 mg by mouth every 8 (eight) hours as needed.    . traMADol (ULTRAM) 50 MG tablet Take 1 tablet (50 mg total) by mouth every 6 (six) hours as needed. 100 tablet 3  . traZODone (DESYREL) 50 MG tablet TAKE 1 TABLET (50 MG TOTAL) BY MOUTH AT BEDTIME AS NEEDED FOR SLEEP. 90 tablet 3  . Vitamin D, Ergocalciferol, (DRISDOL) 1.25 MG (50000 UT) CAPS capsule TAKE 1 CAPSULE (50,000 UNITS TOTAL) BY MOUTH EVERY 7 (SEVEN) DAYS. 12 capsule 1  . diphenhydrAMINE (BENADRYL) 50 MG tablet Patient is to take one tablet 1 hour before test. (Patient not taking: Reported on 04/28/2020) 1 tablet 0  . predniSONE (DELTASONE) 50 MG tablet Patient is to take one tablet 13 hours before test. Patient is to take one tablet 7 hours before test. Patient is to take one tablet 1 hour before the test. (Patient not taking: Reported on 04/28/2020) 3 tablet 0   No current facility-administered medications for this visit.    OBJECTIVE: Vitals:    05/10/20 1319  BP: 132/79  Pulse: 64  Resp: 20  Temp: 98.2 F (36.8 C)  SpO2: 100%     Body mass index is 24.57 kg/m.    ECOG FS:0 - Asymptomatic  General: Well-developed, well-nourished, no acute distress. Eyes: Pink conjunctiva, anicteric sclera. HEENT: Normocephalic, moist mucous membranes. Lungs: No audible wheezing or coughing. Heart: Regular rate and rhythm. Abdomen: Soft, nontender, no obvious distention. Musculoskeletal: No edema, cyanosis, or clubbing. Neuro: Alert, answering all questions appropriately. Cranial nerves grossly intact. Skin: No rashes or petechiae noted. Psych: Normal affect.  LAB RESULTS:  Lab Results  Component Value Date   NA 141 03/31/2020   K 4.8 03/31/2020   CL 105 03/31/2020   CO2 29 03/31/2020   GLUCOSE 119 (H) 03/31/2020   BUN 22 03/31/2020   CREATININE 1.61 (H) 03/31/2020   CALCIUM 9.0 03/31/2020   PROT 7.9 12/17/2019   ALBUMIN 3.6 12/17/2019   AST 14 (L) 12/17/2019   ALT 7 12/17/2019   ALKPHOS 115 12/17/2019   BILITOT 0.4 12/17/2019   GFRNONAA 33 (L) 03/31/2020   GFRAA 38 (L) 03/31/2020    Lab Results  Component Value Date   WBC 6.5 05/09/2020   NEUTROABS 4.3 05/09/2020   HGB 10.1 (L) 05/09/2020   HCT 31.2 (L) 05/09/2020   MCV 92.9 05/09/2020   PLT 282 05/09/2020    Lab Results  Component Value Date   IRON 50 05/09/2020   TIBC 200 (L) 05/09/2020   IRONPCTSAT 25 05/09/2020   Lab Results  Component Value Date   FERRITIN 260 05/09/2020    STUDIES: DG Cervical Spine Complete  Result Date: 04/29/2020 CLINICAL DATA:  Neck pain. No known injury. EXAM: CERVICAL SPINE - COMPLETE 4+ VIEW COMPARISON:  09/14/2016 FINDINGS: There is stable, normal alignment of the cervical spine. Moderate disc height loss identified at C5-6. No acute fracture or traumatic subluxation. Prevertebral soft tissues are unremarkable. The lung apices are clear. IMPRESSION: Stable degenerative changes at C5-6. Electronically Signed   By: Nolon Nations M.D.   On: 04/29/2020 07:31   CT Abdomen Pelvis W Contrast  Result Date: 04/13/2020 CLINICAL DATA:  Abdominal pain with early satiety and increased eructation EXAM: CT ABDOMEN AND PELVIS WITH CONTRAST TECHNIQUE: Multidetector  CT imaging of the abdomen and pelvis was performed using the standard protocol following bolus administration of intravenous contrast. Oral contrast was also administered. CONTRAST:  11mL OMNIPAQUE IOHEXOL 300 MG/ML  SOLN COMPARISON:  October 08, 2019 FINDINGS: Lower chest: Lung bases are clear. There is mild elevation of the left hemidiaphragm. Hepatobiliary: No focal liver lesions are appreciable. Gallbladder is absent. There is no appreciable biliary duct dilatation. Pancreas: There is no pancreatic mass or inflammatory focus. Spleen: No splenic lesions are evident. Adrenals/Urinary Tract: Adrenals bilaterally appear unremarkable. There is no appreciable renal mass or hydronephrosis. No evident renal or ureteral calculus on either side. Urinary bladder is midline with wall thickness within normal limits. Stomach/Bowel: Patient is status post gastric bypass procedure. There is no wall thickening or fluid in the postoperative regions in the upper abdomen. There is apparent prolapse of distal ileum into the cecum with prominence of the ileocecal valve in this region. There is no appreciable bowel obstruction associated with this apparent distal ileal prolapse. Note that there are several loops of jejunum with mild wall thickening which may be indicative of underlying enteritis. There is no free air or portal venous air. Moderate stool is seen in the colon. Vascular/Lymphatic: No abdominal aortic aneurysm. There are foci of aortic atherosclerosis. Major venous structures appear patent. There is no evident adenopathy in the abdomen or pelvis. Reproductive: Uterus is absent.  No pelvic mass evident. Other: There has been repair of midline ventral hernia compared to previous study. There  is scarring in the anterior abdominal wall at the site of hernia repair. No recurrent hernia in this area noted. No appendiceal inflammation. No abscess or ascites evident in the abdomen or pelvis. Musculoskeletal: Postoperative changes noted at L4 and L5. No blastic or lytic bone lesions. No intramuscular lesions are evident. IMPRESSION: 1. 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 No associated bowel obstruction or wall thickening in this area. This finding warrants direct visualization with colonoscopy to further assess this unusual finding. 2. Mild wall thickening of several loops1 of jejunum, suspicious for a degree of enteritis. No frank bowel obstruction. 3. Previous gastric bypass procedure without complicating features in the postoperative regions. 4. Status post midline ventral hernia repair without hernia recurrence. There is scarring in the anterior abdominal wall region. 5. No abscess in the abdomen or pelvis. No periappendiceal region inflammation. 6. Gallbladder absent. Uterus absent. Postoperative change noted at L4 and L5. 7.  Aortic Atherosclerosis (ICD10-I70.0). These results will be called to the ordering clinician or representative by the Radiologist Assistant, and communication documented in the PACS or [employer]. Electronically Signed   By: Lowella Grip III M.D.   On: 04/13/2020 08:37   CUP PACEART REMOTE DEVICE CHECK  Result Date: 04/14/2020 [facility] summary report received. Battery status OK. Normal device function. No new symptom episodes, tachy episodes, brady, or pause episodes. No new AF episodes. Monthly summary reports and ROV/PRN   ASSESSMENT: Iron deficiency anemia.  PLAN:   1.  Iron deficiency anemia: Patient's hemoglobin remains decreased, but stable at 10.1.  Iron stores continue to be within normal limits. Previously, all of her other laboratory work including hemolysis labs, B12, folate, and SPEP  were negative or within normal limits.  Patient had a colonoscopy on September 05, 2018 that was reported as normal.  She does not require additional IV Feraheme today.  Patient last received treatment on February 10, 2020.  Return to clinic in 4 months  for repeat laboratory work, further evaluation, and continuation of treatment if needed.  2.  Abdominal pain: Continue follow-up and treatment per GI.  I spent a total of 20 minutes reviewing chart data, face-to-face evaluation with the patient, counseling and coordination of care as detailed above.   Patient expressed understanding and was in agreement with this plan. She also understands that She can call clinic at any time with any questions, concerns, or complaints.    Lloyd Huger, MD   05/10/2020 3:20 PM

## 2020-05-09 ENCOUNTER — Inpatient Hospital Stay: Payer: Medicare HMO | Attending: Oncology

## 2020-05-09 ENCOUNTER — Other Ambulatory Visit: Payer: Self-pay

## 2020-05-09 ENCOUNTER — Other Ambulatory Visit: Payer: Self-pay | Admitting: Emergency Medicine

## 2020-05-09 DIAGNOSIS — Z801 Family history of malignant neoplasm of trachea, bronchus and lung: Secondary | ICD-10-CM | POA: Insufficient documentation

## 2020-05-09 DIAGNOSIS — Z803 Family history of malignant neoplasm of breast: Secondary | ICD-10-CM | POA: Diagnosis not present

## 2020-05-09 DIAGNOSIS — R109 Unspecified abdominal pain: Secondary | ICD-10-CM | POA: Diagnosis not present

## 2020-05-09 DIAGNOSIS — D509 Iron deficiency anemia, unspecified: Secondary | ICD-10-CM | POA: Insufficient documentation

## 2020-05-09 LAB — CBC WITH DIFFERENTIAL/PLATELET
Abs Immature Granulocytes: 0.01 10*3/uL (ref 0.00–0.07)
Basophils Absolute: 0 10*3/uL (ref 0.0–0.1)
Basophils Relative: 0 %
Eosinophils Absolute: 0.2 10*3/uL (ref 0.0–0.5)
Eosinophils Relative: 3 %
HCT: 31.2 % — ABNORMAL LOW (ref 36.0–46.0)
Hemoglobin: 10.1 g/dL — ABNORMAL LOW (ref 12.0–15.0)
Immature Granulocytes: 0 %
Lymphocytes Relative: 24 %
Lymphs Abs: 1.5 10*3/uL (ref 0.7–4.0)
MCH: 30.1 pg (ref 26.0–34.0)
MCHC: 32.4 g/dL (ref 30.0–36.0)
MCV: 92.9 fL (ref 80.0–100.0)
Monocytes Absolute: 0.5 10*3/uL (ref 0.1–1.0)
Monocytes Relative: 7 %
Neutro Abs: 4.3 10*3/uL (ref 1.7–7.7)
Neutrophils Relative %: 66 %
Platelets: 282 10*3/uL (ref 150–400)
RBC: 3.36 MIL/uL — ABNORMAL LOW (ref 3.87–5.11)
RDW: 14.6 % (ref 11.5–15.5)
WBC: 6.5 10*3/uL (ref 4.0–10.5)
nRBC: 0 % (ref 0.0–0.2)

## 2020-05-09 LAB — IRON AND TIBC
Iron: 50 ug/dL (ref 28–170)
Saturation Ratios: 25 % (ref 10.4–31.8)
TIBC: 200 ug/dL — ABNORMAL LOW (ref 250–450)
UIBC: 150 ug/dL

## 2020-05-09 LAB — FERRITIN: Ferritin: 260 ng/mL (ref 11–307)

## 2020-05-10 ENCOUNTER — Inpatient Hospital Stay (HOSPITAL_BASED_OUTPATIENT_CLINIC_OR_DEPARTMENT_OTHER): Payer: Medicare HMO | Admitting: Oncology

## 2020-05-10 ENCOUNTER — Other Ambulatory Visit: Payer: Self-pay

## 2020-05-10 ENCOUNTER — Inpatient Hospital Stay: Payer: Medicare HMO

## 2020-05-10 ENCOUNTER — Encounter: Payer: Self-pay | Admitting: Oncology

## 2020-05-10 VITALS — BP 132/79 | HR 64 | Temp 98.2°F | Resp 20 | Wt 138.7 lb

## 2020-05-10 DIAGNOSIS — R109 Unspecified abdominal pain: Secondary | ICD-10-CM | POA: Diagnosis not present

## 2020-05-10 DIAGNOSIS — D509 Iron deficiency anemia, unspecified: Secondary | ICD-10-CM | POA: Diagnosis not present

## 2020-05-10 DIAGNOSIS — Z803 Family history of malignant neoplasm of breast: Secondary | ICD-10-CM | POA: Diagnosis not present

## 2020-05-10 DIAGNOSIS — Z801 Family history of malignant neoplasm of trachea, bronchus and lung: Secondary | ICD-10-CM | POA: Diagnosis not present

## 2020-05-10 NOTE — Progress Notes (Signed)
Patient here today for follow up. Reports some back pain at score of 4. States she has no appetite right now and has to drink boost. States she has some trouble with staying asleep. Currently taking meds to help but they are not helping. Also reports in last week she has had some unsteadiness when turning and moving.

## 2020-05-12 ENCOUNTER — Other Ambulatory Visit: Payer: Self-pay

## 2020-05-12 ENCOUNTER — Ambulatory Visit (INDEPENDENT_AMBULATORY_CARE_PROVIDER_SITE_OTHER): Payer: Medicare HMO | Admitting: Family Medicine

## 2020-05-12 ENCOUNTER — Encounter: Payer: Self-pay | Admitting: Family Medicine

## 2020-05-12 VITALS — BP 110/68 | HR 56 | Temp 97.1°F | Ht 63.0 in | Wt 137.8 lb

## 2020-05-12 DIAGNOSIS — K219 Gastro-esophageal reflux disease without esophagitis: Secondary | ICD-10-CM

## 2020-05-12 DIAGNOSIS — M542 Cervicalgia: Secondary | ICD-10-CM | POA: Diagnosis not present

## 2020-05-12 DIAGNOSIS — F4323 Adjustment disorder with mixed anxiety and depressed mood: Secondary | ICD-10-CM | POA: Diagnosis not present

## 2020-05-12 NOTE — Patient Instructions (Signed)
Stop Meloxicam 

## 2020-05-15 ENCOUNTER — Emergency Department: Payer: Medicare HMO

## 2020-05-15 ENCOUNTER — Other Ambulatory Visit: Payer: Self-pay

## 2020-05-15 ENCOUNTER — Emergency Department
Admission: EM | Admit: 2020-05-15 | Discharge: 2020-05-15 | Disposition: A | Discharge status: Home / Self Care | Payer: Medicare HMO | Attending: Emergency Medicine | Admitting: Emergency Medicine

## 2020-05-15 DIAGNOSIS — Z7984 Long term (current) use of oral hypoglycemic drugs: Secondary | ICD-10-CM | POA: Insufficient documentation

## 2020-05-15 DIAGNOSIS — Z79899 Other long term (current) drug therapy: Secondary | ICD-10-CM | POA: Diagnosis not present

## 2020-05-15 DIAGNOSIS — Y999 Unspecified external cause status: Secondary | ICD-10-CM | POA: Diagnosis not present

## 2020-05-15 DIAGNOSIS — L089 Local infection of the skin and subcutaneous tissue, unspecified: Secondary | ICD-10-CM | POA: Insufficient documentation

## 2020-05-15 DIAGNOSIS — I10 Essential (primary) hypertension: Secondary | ICD-10-CM | POA: Insufficient documentation

## 2020-05-15 DIAGNOSIS — I252 Old myocardial infarction: Secondary | ICD-10-CM | POA: Insufficient documentation

## 2020-05-15 DIAGNOSIS — E11621 Type 2 diabetes mellitus with foot ulcer: Secondary | ICD-10-CM | POA: Diagnosis not present

## 2020-05-15 DIAGNOSIS — E11628 Type 2 diabetes mellitus with other skin complications: Secondary | ICD-10-CM

## 2020-05-15 DIAGNOSIS — Y9389 Activity, other specified: Secondary | ICD-10-CM | POA: Diagnosis not present

## 2020-05-15 DIAGNOSIS — L97509 Non-pressure chronic ulcer of other part of unspecified foot with unspecified severity: Secondary | ICD-10-CM | POA: Diagnosis not present

## 2020-05-15 DIAGNOSIS — X58XXXA Exposure to other specified factors, initial encounter: Secondary | ICD-10-CM | POA: Diagnosis not present

## 2020-05-15 DIAGNOSIS — S90822A Blister (nonthermal), left foot, initial encounter: Secondary | ICD-10-CM | POA: Diagnosis present

## 2020-05-15 DIAGNOSIS — Y929 Unspecified place or not applicable: Secondary | ICD-10-CM | POA: Insufficient documentation

## 2020-05-15 MED ORDER — CEPHALEXIN 500 MG PO CAPS
500.0000 mg | ORAL_CAPSULE | Freq: Once | ORAL | Status: AC
  Administered 2020-05-15: 500 mg via ORAL
  Filled 2020-05-15: qty 1

## 2020-05-15 MED ORDER — FLUCONAZOLE 150 MG PO TABS: ORAL_TABLET | ORAL | 0 refills | Status: DC

## 2020-05-15 MED ORDER — CEPHALEXIN 500 MG PO CAPS: 500.0000 mg | ORAL_CAPSULE | Freq: Three times a day (TID) | ORAL | 0 refills | Status: DC

## 2020-05-15 NOTE — Discharge Instructions (Addendum)
Follow-up with your regular doctor if not improving in 3 days.  Return emergency department worsening. Take the antibiotic as prescribed Diflucan for yeast infection due to antibiotics if needed Buy a blister pad at the drugstore to wear on the bottom of your foot

## 2020-05-15 NOTE — ED Provider Notes (Signed)
[facility] Emergency Department Provider Note  ____________________________________________   First MD Initiated Contact with Patient 05/15/20 1643     (approximate)  I have reviewed the triage vital signs and the nursing notes.   HISTORY  Chief Complaint Blister    HPI LIOR BURBANK is a 69 y.o. female presents emergency department complaining of a blister on the bottom of her foot.  States that has some pus draining out of it.  She did walk barefoot across the mulch and had to pick it out of her foot.  No fever or chills.  Patient has history of diabetes.   Pain rated 6/10   Past Medical History:  Diagnosis Date  . Anemia   . Arthritis   . COVID-19 11/19/2019  . Diverticulitis   . DM (diabetes mellitus) ([place])    typ e 2  . History of methicillin resistant staphylococcus aureus (MRSA) 2017  . HTN (hypertension)   . Hyperlipidemia   . Memory difficulties 09/01/2015  . Multiple thyroid nodules   . Myocardial infarction ([place]) 1995  . Short-term memory loss     Patient Active Problem List   Diagnosis Date Noted  . Gastroesophageal reflux disease 04/28/2020  . Large bowel obstruction ([place]) 04/17/2019  . Acquired trigger finger 03/20/2019  . Iron deficiency anemia 11/21/2018  . Syncope 03/18/2018  . Anemia 03/18/2018  . Exophthalmos 02/22/2018  . Multiple thyroid nodules 02/22/2018  . 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 Pain at surgical site 08/30/2016  . Ventral incisional hernia 08/21/2016  . SBO (small bowel obstruction) ([place])   . Partial small bowel obstruction ([place]) 04/23/2016  . Adjustment disorder with mixed anxiety and depressed mood 04/23/2016  . Memory difficulties 09/01/2015  . Arthritis 06/11/2015  . Back pain, chronic 06/11/2015  . HLD (hyperlipidemia) 06/11/2015  . Cannot sleep 06/11/2015  . L-S radiculopathy 06/11/2015  . Arthritis of knee, degenerative 06/11/2015  . B12 deficiency 06/11/2015  . 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 DM2 (diabetes mellitus, type 2) ([place]) 11/15/2012  . Adiposity 11/21/2009  . Avitaminosis D 11/07/2009  . Narrowing of intervertebral disc space 07/25/2009  . Benign essential HTN 07/25/2009    Past Surgical History:  Procedure Laterality Date  . ABDOMINAL HYSTERECTOMY     due to endometriosis-1 ovary left  . BREAST EXCISIONAL BIOPSY Right 1971   neg  . CARDIAC CATHETERIZATION  2000   no significant CAD per note of Dr Caryl Comes in 2013   . CHOLECYSTECTOMY    . CYSTOCELE REPAIR N/A 03/19/2017   Procedure: ANTERIOR REPAIR (CYSTOCELE);  Surgeon: Bjorn Loser, MD;  Location: [facility];  Service: Urology;  Laterality: N/A;  . CYSTOSCOPY N/A 03/19/2017   Procedure: CYSTOSCOPY;  Surgeon: Bjorn Loser, MD;  Location: [facility];  Service: Urology;  Laterality: N/A;  . EYE SURGERY    . FOOT SURGERY    . HAND SURGERY    . HEMICOLECTOMY    . INSERTION OF MESH N/A 08/21/2016   Procedure: INSERTION OF MESH;  Surgeon: Excell Seltzer, MD;  Location: [facility];  Service: General;  Laterality: N/A;  . INSERTION OF MESH N/A 12/08/2019   Procedure: INSERTION OF MESH;  Surgeon: Jules Husbands, MD;  Location: [facility];  Service: General;  Laterality: N/A;  . LAPAROSCOPIC LYSIS OF ADHESIONS N/A 08/21/2016   Procedure: LAPAROSCOPIC LYSIS OF ADHESIONS;  Surgeon: Excell Seltzer, MD;  Location: [facility];  Service: General;  Laterality:  N/A;  . LAPAROSCOPIC REMOVAL ABDOMINAL MASS    . LAPAROTOMY N/A 04/16/2019   Procedure: EXPLORATORY LAPAROTOMY;  Surgeon: Benjamine Sprague, DO;  Location: [facility];  Service: General;  Laterality: N/A;  . LOOP RECORDER INSERTION N/A 03/21/2018   Procedure: LOOP RECORDER INSERTION;  Surgeon: Sanda Klein, MD;  Location: [facility];  Service: Cardiovascular;  Laterality: N/A;  . LUMBAR [place]    . ROUX-EN-Y GASTRIC BYPASS  2010   Dr Hassell Done  . TONSILLECTOMY    . UPPER GI ENDOSCOPY  01/12/16    normal larynx, normal esophagus. gastric bypass with a normal-sized pouch and intact staple line, normal examined jejunum, otherwise exam was normal  . VENTRAL HERNIA REPAIR N/A 08/21/2016   Procedure: LAPAROSCOPIC VENTRAL HERNIA;  Surgeon: Excell Seltzer, MD;  Location: [facility];  Service: General;  Laterality: N/A;  . XI ROBOTIC ASSISTED VENTRAL HERNIA N/A 12/08/2019   Procedure: XI ROBOTIC ASSISTED VENTRAL HERNIA;  Surgeon: Jules Husbands, MD;  Location: [facility];  Service: General;  Laterality: N/A;    Prior to Admission medications   Medication Sig Start Date End Date Taking? Authorizing Provider  cephALEXin (KEFLEX) 500 MG capsule Take 1 capsule (500 mg total) by mouth 3 (three) times daily. 05/15/20   Aniyla Harling, Linden Dolin, PA-C  diclofenac Sodium (VOLTAREN) 1 % GEL Voltaren 1 % topical gel    [provider]  fluconazole (DIFLUCAN) 150 MG tablet Take one now and one in a week for yeast 05/15/20   Josha Weekley, Linden Dolin, PA-C  gabapentin (NEURONTIN) 100 MG capsule Take 100 mg by mouth 2 (two) times daily.    [provider]  hydrochlorothiazide (HYDRODIURIL) 25 MG tablet Take 1 tablet (25 mg total) by mouth daily. 02/08/20   Jerrol Banana., MD  LINZESS 290 MCG CAPS capsule TAKE 1 CAPSULE (290 MCG TOTAL) BY MOUTH DAILY BEFORE BREAKFAST. 03/16/20   Jerrol Banana., MD  meloxicam (MOBIC) 15 MG tablet Take 15 mg by mouth daily.  08/13/17   [provider]  metFORMIN (GLUCOPHAGE) 500 MG tablet TAKE 1 TABLET (500 MG TOTAL) BY MOUTH 2 (TWO) TIMES DAILY WITH A MEAL. 12/05/19   Jerrol Banana., MD  methocarbamol (ROBAXIN) 500 MG tablet Take 500 mg by mouth 2 (two) times daily. Back pain 05/22/19   [provider]  metoprolol succinate (TOPROL-XL) 25 MG 24 hr tablet Take 25 mg by mouth daily. 04/28/20   [provider]  omeprazole (PRILOSEC) 20 MG capsule Take 1 capsule (20 mg total) by mouth daily. 04/28/20   Jerrol Banana., MD  ondansetron  (ZOFRAN ODT) 4 MG disintegrating tablet Take 1 tablet (4 mg total) by mouth every 8 (eight) hours as needed for nausea or vomiting. Patient taking differently: Take 4 mg by mouth every 8 (eight) hours as needed for nausea or vomiting.  04/06/19   Harvest Dark, MD  ondansetron (ZOFRAN) 4 MG tablet Take 4 mg by mouth every 8 (eight) hours as needed. 03/17/20   [provider]  traMADol (ULTRAM) 50 MG tablet Take 1 tablet (50 mg total) by mouth every 6 (six) hours as needed. 03/15/20   Jerrol Banana., MD  traZODone (DESYREL) 50 MG tablet TAKE 1 TABLET (50 MG TOTAL) BY MOUTH AT BEDTIME AS NEEDED FOR SLEEP. 08/14/19   Jerrol Banana., MD  Vitamin D, Ergocalciferol, (DRISDOL) 1.25 MG (50000 UT) CAPS capsule TAKE 1 CAPSULE (50,000 UNITS TOTAL) BY MOUTH EVERY 7 (SEVEN)  DAYS. 12/07/19   Jerrol Banana., MD  diphenhydrAMINE (BENADRYL) 50 MG tablet Patient is to take one tablet 1 hour before test. Patient not taking: Reported on 04/28/2020 04/04/20 05/15/20  Jules Husbands, MD    Allergies Lotrel [amlodipine besy-benazepril hcl], Contrast media [iodinated diagnostic agents], Niacin, and Sulfa antibiotics  Family History  Problem Relation Age of Onset  . Cancer Father 60       Lung  . Coronary artery disease Father 55  . COPD Mother   . Diabetes Mother   . Hypertension Mother   . Cancer Paternal Grandmother        Breast  . Breast cancer Paternal Grandmother   . Congestive Heart Failure Maternal Grandmother   . Emphysema Maternal Grandfather   . Heart attack Paternal Grandfather   . Cancer Paternal Aunt        Breast  . Breast cancer Paternal Aunt 62  . Breast cancer Paternal Aunt 27    Social History Social History   Tobacco Use  . Smoking status: Never Smoker  . Smokeless tobacco: Never Used  Substance Use Topics  . Alcohol use: No  . Drug use: No    Review of Systems  Constitutional: No fever/chills Eyes: No visual changes. ENT: No sore  throat. Respiratory: Denies cough Cardiovascular: Denies chest pain Gastrointestinal: Denies abdominal pain Genitourinary: Negative for dysuria. Musculoskeletal: Negative for back pain.  Positive for a blister on the bottom of the left foot Skin: Negative for rash. Psychiatric: no mood changes,     ____________________________________________   PHYSICAL EXAM:  VITAL SIGNS: ED Triage Vitals  Enc Vitals Group     BP 05/15/20 1539 133/72     Pulse Rate 05/15/20 1539 (!) 57     Resp 05/15/20 1539 18     Temp 05/15/20 1539 98 F (36.7 C)     Temp Source 05/15/20 1539 Oral     SpO2 05/15/20 1539 100 %     Weight 05/15/20 1546 132 lb (59.9 kg)     Height 05/15/20 1546 5\' 3"  (1.6 m)     Head Circumference --      Peak Flow --      Pain Score 05/15/20 1546 6     Pain Loc --      Pain Edu? --      Excl. in [place]? --     Constitutional: Alert and oriented. Well appearing and in no acute distress. Eyes: Conjunctivae are normal.  Head: Atraumatic. Nose: No congestion/rhinnorhea. Mouth/Throat: Mucous membranes are moist.   Neck:  supple no lymphadenopathy noted Cardiovascular: Normal rate, regular rhythm.  Respiratory: Normal respiratory effort.  No retractions GU: deferred Musculoskeletal: FROM all extremities, warm and well perfused, small blister noted to the plantar surface of the left foot, no drainage is noted, area is located in the center of the foot, toes appear to be normal Neurologic:  Normal speech and language.  Skin:  Skin is warm, dry and intact. No rash noted. Psychiatric: Mood and affect are normal. Speech and behavior are normal.  ____________________________________________   LABS (all labs ordered are listed, but only abnormal results are displayed)  Labs Reviewed - No data to display ____________________________________________   ____________________________________________  RADIOLOGY  X-ray of the left foot is negative for osteomyelitis or gas  pattern  ____________________________________________   PROCEDURES  Procedure(s) performed: No  Procedures    ____________________________________________   INITIAL IMPRESSION / ASSESSMENT AND PLAN / ED COURSE  Pertinent labs &  imaging results that were available during my care of the patient were reviewed by me and considered in my medical decision making (see chart for details).   Patient 69 year old diabetic female presents emergency department with concerns of a blister to the plantar surface of her left foot.  Physical exam is consistent with same.  X-ray of the left foot   X-ray of the left foot does not show osteomyelitis or gas pattern.  Therefore I feel very confident that the patient can overcome the infection with oral antibiotics.  She was encouraged to take Diflucan as prescribed if she is prone to yeast infections due to antibiotics.  She is to also buy over-the-counter blister pad to help cushion the area on the bottom of the foot.  Also discussed with her that she should always wear shoes when outside.  She states she understands.  She was discharged stable condition.  Julie Acevedo was evaluated in Emergency Department on 05/15/2020 for the symptoms described in the history of present illness. She was evaluated in the context of the global COVID-19 pandemic, which necessitated consideration that the patient might be at risk for infection with the SARS-CoV-2 virus that causes COVID-19. Institutional protocols and algorithms that pertain to the evaluation of patients at risk for COVID-19 are in a state of rapid change based on information released by regulatory bodies including the CDC and federal and state organizations. These policies and algorithms were followed during the patient's care in the ED.   As part of my medical decision making, I reviewed the following data within the [place] notes reviewed and incorporated, Old chart reviewed,  Radiograph reviewed , Notes from prior ED visits and [place] Controlled Substance Database  ____________________________________________   FINAL CLINICAL IMPRESSION(S) / ED DIAGNOSES  Final diagnoses:  Diabetic infection of left foot ([place])      NEW MEDICATIONS STARTED DURING THIS VISIT:  New Prescriptions   CEPHALEXIN (KEFLEX) 500 MG CAPSULE    Take 1 capsule (500 mg total) by mouth 3 (three) times daily.   FLUCONAZOLE (DIFLUCAN) 150 MG TABLET    Take one now and one in a week for yeast     Note:  This document was prepared using Dragon voice recognition software and may include unintentional dictation errors.    Versie Starks, PA-C 05/15/20 1819    Earleen Newport, MD 05/15/20 [phone]

## 2020-05-15 NOTE — ED Triage Notes (Signed)
Pt has small blister on bottom of left foot and is concerned about losing her foot due to being diabetic. Blister intact with small amount of pus under it. No redness, swelling, or necrosis around it. No fevers. Good pulses. Pt ambulatory.

## 2020-05-16 DIAGNOSIS — R55 Syncope and collapse: Secondary | ICD-10-CM | POA: Diagnosis not present

## 2020-05-16 LAB — CUP PACEART REMOTE DEVICE CHECK
Date Time Interrogation Session: 20210529233428
Implantable Pulse Generator Implant Date: 20190405

## 2020-05-17 ENCOUNTER — Ambulatory Visit (INDEPENDENT_AMBULATORY_CARE_PROVIDER_SITE_OTHER): Payer: Medicare HMO | Admitting: *Deleted

## 2020-05-17 DIAGNOSIS — R55 Syncope and collapse: Secondary | ICD-10-CM | POA: Diagnosis not present

## 2020-05-17 NOTE — Progress Notes (Signed)
[facility] Summary Report / Loop Recorder 

## 2020-05-24 ENCOUNTER — Ambulatory Visit: Payer: Medicare HMO | Admitting: Gastroenterology

## 2020-06-03 NOTE — Progress Notes (Deleted)
     Established patient visit   Patient: Julie Acevedo   DOB: 02-09-1951   69 y.o. Female  MRN: [id number] Visit Date: 06/06/2020  Today's healthcare provider: [name], MD   No chief complaint on file.  Subjective    HPI ***  {Show patient history (optional):23778::" "}   Medications: Outpatient Medications Prior to Visit  Medication Sig  . cephALEXin (KEFLEX) 500 MG capsule Take 1 capsule (500 mg total) by mouth 3 (three) times daily.  . diclofenac Sodium (VOLTAREN) 1 % GEL Voltaren 1 % topical gel  . fluconazole (DIFLUCAN) 150 MG tablet Take one now and one in a week for yeast  . gabapentin (NEURONTIN) 100 MG capsule Take 100 mg by mouth 2 (two) times daily.  . hydrochlorothiazide (HYDRODIURIL) 25 MG tablet Take 1 tablet (25 mg total) by mouth daily.  Marland Kitchen LINZESS 290 MCG CAPS capsule TAKE 1 CAPSULE (290 MCG TOTAL) BY MOUTH DAILY BEFORE BREAKFAST.  . meloxicam (MOBIC) 15 MG tablet Take 15 mg by mouth daily.   . metFORMIN (GLUCOPHAGE) 500 MG tablet TAKE 1 TABLET (500 MG TOTAL) BY MOUTH 2 (TWO) TIMES DAILY WITH A MEAL.  . methocarbamol (ROBAXIN) 500 MG tablet Take 500 mg by mouth 2 (two) times daily. Back pain  . metoprolol succinate (TOPROL-XL) 25 MG 24 hr tablet Take 25 mg by mouth daily.  Marland Kitchen omeprazole (PRILOSEC) 20 MG capsule Take 1 capsule (20 mg total) by mouth daily.  . ondansetron (ZOFRAN ODT) 4 MG disintegrating tablet Take 1 tablet (4 mg total) by mouth every 8 (eight) hours as needed for nausea or vomiting. (Patient taking differently: Take 4 mg by mouth every 8 (eight) hours as needed for nausea or vomiting. )  . ondansetron (ZOFRAN) 4 MG tablet Take 4 mg by mouth every 8 (eight) hours as needed.  . traMADol (ULTRAM) 50 MG tablet Take 1 tablet (50 mg total) by mouth every 6 (six) hours as needed.  . traZODone (DESYREL) 50 MG tablet TAKE 1 TABLET (50 MG TOTAL) BY MOUTH AT BEDTIME AS NEEDED FOR SLEEP.  . Vitamin D, Ergocalciferol, (DRISDOL) 1.25 MG (50000 UT) CAPS  capsule TAKE 1 CAPSULE (50,000 UNITS TOTAL) BY MOUTH EVERY 7 (SEVEN) DAYS.   No facility-administered medications prior to visit.    Review of Systems  Constitutional: Negative for appetite change, chills, fatigue and fever.  Respiratory: Negative for chest tightness and shortness of breath.   Cardiovascular: Negative for chest pain and palpitations.  Gastrointestinal: Negative for abdominal pain, nausea and vomiting.  Neurological: Negative for dizziness and weakness.    {Heme  Chem  Endocrine  Serology  Results Review (optional):23779::" "}  Objective    There were no vitals taken for this visit. {Show previous vital signs (optional):23777::" "}  Physical Exam  ***  No results found for any visits on 06/06/20.  Assessment & Plan     ***  No follow-ups on file.      {provider attestation***:1}   [name], MD  [facility] [phone] (phone) [phone] (fax)  [place]

## 2020-06-06 ENCOUNTER — Ambulatory Visit: Payer: Medicare HMO | Admitting: Family Medicine

## 2020-06-13 DIAGNOSIS — E113393 Type 2 diabetes mellitus with moderate nonproliferative diabetic retinopathy without macular edema, bilateral: Secondary | ICD-10-CM | POA: Diagnosis not present

## 2020-06-17 ENCOUNTER — Ambulatory Visit (INDEPENDENT_AMBULATORY_CARE_PROVIDER_SITE_OTHER): Payer: Medicare HMO | Admitting: *Deleted

## 2020-06-17 DIAGNOSIS — R55 Syncope and collapse: Secondary | ICD-10-CM

## 2020-06-17 LAB — CUP PACEART REMOTE DEVICE CHECK
Date Time Interrogation Session: 20210701233904
Implantable Pulse Generator Implant Date: 20190405

## 2020-06-21 NOTE — Progress Notes (Signed)
[facility] Summary Report / Loop Recorder 

## 2020-06-22 NOTE — Progress Notes (Signed)
Established patient visit   Patient: Julie Acevedo   DOB: Feb 10, 1951   69 y.o. Female  MRN: [id number] Visit Date: 06/23/2020  I,Neleh Muldoon S Rindi Beechy,acting as a scribe for [name], MD.,have documented all relevant documentation on the behalf of [name], MD,as directed by  [name], MD while in the presence of [name], MD.  Today's healthcare provider: [name], MD   Chief Complaint  Patient presents with  . Back Pain   Subjective    HPI HPI    Back Pain    This is a new problem.  There was not an injury that may have caused the pain.  Recent episode started 1 to 4 weeks ago.  The problem has been unchanged since onset.  Pain is lumbar spine.  The pain radiates to right knee and right thigh.  The quality of pain is described as aching and stiffness.  Severity of the pain is moderate.  Pain occurs constantly.  Symptoms worse in morning.  The symptoms are aggravated by bending, standing and sitting.  Treatments: none, NSAIDs and prescription pain relievers.  Treatment provided mild relief.  Abdominal Pain: Absent.  Bowel incontinence: Absent.  Chest pain:  Absent.  Dysuria: Absent.  Fever: Absent.  Headaches: Absent.  Weakness in leg: Present.  Pelvic pain: Absent.  Tingling in lower extremities: Absent.  Urinary incontinence: Absent.  Weight loss: Absent.       Last edited by Dorian Pod, CMA on 06/23/2020  1:16 PM. (History)      Patient Active Problem List   Diagnosis Date Noted  . Low back pain radiating to right lower extremity 06/23/2020  . Gastroesophageal reflux disease 04/28/2020  . Large bowel obstruction ([place]) 04/17/2019  . Acquired trigger finger 03/20/2019  . Iron deficiency anemia 11/21/2018  . Syncope 03/18/2018  . Anemia 03/18/2018  . Exophthalmos 02/22/2018  . Multiple thyroid nodules 02/22/2018  . 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 Pain at surgical site 08/30/2016  . Ventral incisional hernia  08/21/2016  . SBO (small bowel obstruction) ([place])   . Partial small bowel obstruction ([place]) 04/23/2016  . Adjustment disorder with mixed anxiety and depressed mood 04/23/2016  . Memory difficulties 09/01/2015  . Arthritis 06/11/2015  . Back pain, chronic 06/11/2015  . HLD (hyperlipidemia) 06/11/2015  . Cannot sleep 06/11/2015  . L-S radiculopathy 06/11/2015  . Arthritis of knee, degenerative 06/11/2015  . B12 deficiency 06/11/2015  . 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 DM2 (diabetes mellitus, type 2) ([place]) 11/15/2012  . Adiposity 11/21/2009  . Avitaminosis D 11/07/2009  . Narrowing of intervertebral disc space 07/25/2009  . Benign essential HTN 07/25/2009   Social History   Tobacco Use  . Smoking status: Never Smoker  . Smokeless tobacco: Never Used  Vaping Use  . Vaping Use: Never used  Substance Use Topics  . Alcohol use: No  . Drug use: No       Medications: Outpatient Medications Prior to Visit  Medication Sig  . gabapentin (NEURONTIN) 100 MG capsule Take 100 mg by mouth 2 (two) times daily.  . hydrochlorothiazide (HYDRODIURIL) 25 MG tablet Take 1 tablet (25 mg total) by mouth daily.  Marland Kitchen LINZESS 290 MCG CAPS capsule TAKE 1 CAPSULE (290 MCG TOTAL) BY MOUTH DAILY BEFORE BREAKFAST.  . meloxicam (MOBIC) 15 MG tablet Take 15 mg by mouth daily.   . metFORMIN (GLUCOPHAGE) 500 MG tablet  TAKE 1 TABLET (500 MG TOTAL) BY MOUTH 2 (TWO) TIMES DAILY WITH A MEAL.  . methocarbamol (ROBAXIN) 500 MG tablet Take 500 mg by mouth 2 (two) times daily. Back pain  . metoprolol succinate (TOPROL-XL) 25 MG 24 hr tablet Take 25 mg by mouth daily.  Marland Kitchen omeprazole (PRILOSEC) 20 MG capsule Take 1 capsule (20 mg total) by mouth daily.  . ondansetron (ZOFRAN ODT) 4 MG disintegrating tablet Take 1 tablet (4 mg total) by mouth every 8 (eight) hours as needed for nausea or vomiting. (Patient taking differently: Take 4 mg by mouth every 8  (eight) hours as needed for nausea or vomiting. )  . ondansetron (ZOFRAN) 4 MG tablet Take 4 mg by mouth every 8 (eight) hours as needed.  . traMADol (ULTRAM) 50 MG tablet Take 1 tablet (50 mg total) by mouth every 6 (six) hours as needed.  . traZODone (DESYREL) 50 MG tablet TAKE 1 TABLET (50 MG TOTAL) BY MOUTH AT BEDTIME AS NEEDED FOR SLEEP.  . Vitamin D, Ergocalciferol, (DRISDOL) 1.25 MG (50000 UT) CAPS capsule TAKE 1 CAPSULE (50,000 UNITS TOTAL) BY MOUTH EVERY 7 (SEVEN) DAYS.  Marland Kitchen diclofenac Sodium (VOLTAREN) 1 % GEL Voltaren 1 % topical gel (Patient not taking: Reported on 06/23/2020)  . [DISCONTINUED] cephALEXin (KEFLEX) 500 MG capsule Take 1 capsule (500 mg total) by mouth 3 (three) times daily.  . [DISCONTINUED] fluconazole (DIFLUCAN) 150 MG tablet Take one now and one in a week for yeast   No facility-administered medications prior to visit.    Review of Systems  Constitutional: Negative for chills and fever.  Respiratory: Negative for chest tightness and shortness of breath.   Cardiovascular: Negative for chest pain and palpitations.  Gastrointestinal: Negative for abdominal pain.  Endocrine: Negative.   Genitourinary: Negative for dysuria.  Musculoskeletal: Positive for back pain.  Allergic/Immunologic: Negative.   Psychiatric/Behavioral: Negative.       Objective    BP 115/82 (BP Location: Right Arm, Patient Position: Sitting, Cuff Size: Normal)   Pulse (!) 56   Temp (!) 97.5 F (36.4 C) (Temporal)   Resp 16   Wt 133 lb (60.3 kg)   BMI 23.56 kg/m  BP Readings from Last 3 Encounters:  06/23/20 115/82  05/15/20 133/72  05/12/20 110/68   Wt Readings from Last 3 Encounters:  06/23/20 133 lb (60.3 kg)  05/15/20 132 lb (59.9 kg)  05/12/20 137 lb 12.8 oz (62.5 kg)      Physical Exam Vitals reviewed.  Constitutional:      General: She is not in acute distress.    Appearance: She is well-developed.  HENT:     Head: Normocephalic and atraumatic.  Eyes:     General:  No scleral icterus.    Conjunctiva/sclera: Conjunctivae normal.  Cardiovascular:     Rate and Rhythm: Normal rate and regular rhythm.  Pulmonary:     Effort: Pulmonary effort is normal. No respiratory distress.  Musculoskeletal:     Lumbar back: Decreased range of motion.     Comments: Tender over both SI joint Left DTR 2+ Right DTR 1+ Weakness in right leg  weakness with dorsi flex and plantar flex  Skin:    General: Skin is warm and dry.     Findings: No rash.  Neurological:     Mental Status: She is alert and oriented to person, place, and time.  Psychiatric:        Behavior: Behavior normal.      Results for orders placed or  performed in visit on 06/23/20  POCT urinalysis dipstick  Result Value Ref Range   Color, UA yellow    Clarity, UA clear    Glucose, UA Negative Negative   Bilirubin, UA Negative    Ketones, UA Negative    Spec Grav, UA 1.020 1.010 - 1.025   Blood, UA Negative    pH, UA 6.0 5.0 - 8.0   Protein, UA Negative Negative   Urobilinogen, UA 0.2 0.2 or 1.0 E.U./dL   Nitrite, UA Negative    Leukocytes, UA Negative Negative  POCT UA - Microalbumin  Result Value Ref Range   Microalbumin Ur, POC 20 mg/L    Assessment & Plan     Problem List Items Addressed This Visit      Endocrine   DM2 (diabetes mellitus, type 2) (HCC) (Chronic)   Relevant Orders   POCT UA - Microalbumin (Completed)     Other   Low back pain radiating to right lower extremity - Primary    Chronic, and worsening in the last several weeks Start Prednisone 10 mg 6 day taper Start Cyclobenzaprine 5 mg TID as needed Urine dip normal Follow up in 2 weeks      Relevant Medications   predniSONE (STERAPRED UNI-PAK 21 TAB) 10 MG (21) TBPK tablet   cyclobenzaprine (FLEXERIL) 5 MG tablet   Other Relevant Orders   POCT urinalysis dipstick (Completed)     Problem List Items Addressed This Visit      Cardiovascular and Mediastinum   Benign essential HTN (Chronic)    Controlled  on HCTZ. And Metoprolol.        Digestive   Gastroesophageal reflux disease    On Omeprazole.        Endocrine   DM2 (diabetes mellitus, type 2) (HCC) (Chronic)   Relevant Orders   POCT UA - Microalbumin (Completed)     Nervous and Auditory   L-S radiculopathy   Relevant Medications   cyclobenzaprine (FLEXERIL) 5 MG tablet     Other   Gastric bypass status for obesity (Chronic)   HLD (hyperlipidemia)   B12 deficiency   Low back pain radiating to right lower extremity - Primary    Chronic, and worsening in the last several weeks Start Prednisone 10 mg 6 day taper Start Cyclobenzaprine 5 mg TID as needed Urine dip normal Follow up in 2 weeks      Relevant Medications   predniSONE (STERAPRED UNI-PAK 21 TAB) 10 MG (21) TBPK tablet   cyclobenzaprine (FLEXERIL) 5 MG tablet   Other Relevant Orders   POCT urinalysis dipstick (Completed)       Return in about 2 weeks (around 07/07/2020) for chronic disease f/u.      I, [name], MD, have reviewed all documentation for this visit. The documentation on 06/25/20 for the exam, diagnosis, procedures, and orders are all accurate and complete.    Richard Cranford Mon, MD  [facility] [phone] (phone) [phone] (fax)  [place]

## 2020-06-23 ENCOUNTER — Other Ambulatory Visit: Payer: Self-pay

## 2020-06-23 ENCOUNTER — Ambulatory Visit (INDEPENDENT_AMBULATORY_CARE_PROVIDER_SITE_OTHER): Payer: Medicare HMO | Admitting: Family Medicine

## 2020-06-23 ENCOUNTER — Encounter: Payer: Self-pay | Admitting: Family Medicine

## 2020-06-23 VITALS — BP 115/82 | HR 56 | Temp 97.5°F | Resp 16 | Wt 133.0 lb

## 2020-06-23 DIAGNOSIS — M545 Low back pain, unspecified: Secondary | ICD-10-CM | POA: Insufficient documentation

## 2020-06-23 DIAGNOSIS — I10 Essential (primary) hypertension: Secondary | ICD-10-CM | POA: Diagnosis not present

## 2020-06-23 DIAGNOSIS — M5417 Radiculopathy, lumbosacral region: Secondary | ICD-10-CM

## 2020-06-23 DIAGNOSIS — N182 Chronic kidney disease, stage 2 (mild): Secondary | ICD-10-CM

## 2020-06-23 DIAGNOSIS — K219 Gastro-esophageal reflux disease without esophagitis: Secondary | ICD-10-CM | POA: Diagnosis not present

## 2020-06-23 DIAGNOSIS — E538 Deficiency of other specified B group vitamins: Secondary | ICD-10-CM

## 2020-06-23 DIAGNOSIS — M79604 Pain in right leg: Secondary | ICD-10-CM | POA: Insufficient documentation

## 2020-06-23 DIAGNOSIS — E1122 Type 2 diabetes mellitus with diabetic chronic kidney disease: Secondary | ICD-10-CM

## 2020-06-23 DIAGNOSIS — Z9884 Bariatric surgery status: Secondary | ICD-10-CM

## 2020-06-23 DIAGNOSIS — E782 Mixed hyperlipidemia: Secondary | ICD-10-CM | POA: Diagnosis not present

## 2020-06-23 LAB — POCT URINALYSIS DIPSTICK
Bilirubin, UA: NEGATIVE
Blood, UA: NEGATIVE
Glucose, UA: NEGATIVE
Ketones, UA: NEGATIVE
Leukocytes, UA: NEGATIVE
Nitrite, UA: NEGATIVE
Protein, UA: NEGATIVE
Spec Grav, UA: 1.02 (ref 1.010–1.025)
Urobilinogen, UA: 0.2 E.U./dL
pH, UA: 6 (ref 5.0–8.0)

## 2020-06-23 LAB — POCT UA - MICROALBUMIN: Microalbumin Ur, POC: 20 mg/L

## 2020-06-23 MED ORDER — CYCLOBENZAPRINE HCL 5 MG PO TABS: 5.0000 mg | ORAL_TABLET | Freq: Three times a day (TID) | ORAL | 2 refills | Status: DC | PRN

## 2020-06-23 MED ORDER — PREDNISONE 10 MG (21) PO TBPK: ORAL_TABLET | ORAL | 0 refills | Status: AC

## 2020-06-23 NOTE — Assessment & Plan Note (Signed)
Chronic, and worsening in the last several weeks Start Prednisone 10 mg 6 day taper Start Cyclobenzaprine 5 mg TID as needed Urine dip normal Follow up in 2 weeks

## 2020-06-25 NOTE — Assessment & Plan Note (Signed)
On Omeprazole 

## 2020-06-25 NOTE — Assessment & Plan Note (Addendum)
Controlled on HCTZ. And Metoprolol.

## 2020-06-28 ENCOUNTER — Encounter: Payer: Self-pay | Admitting: Family Medicine

## 2020-07-05 NOTE — Progress Notes (Signed)
Established patient visit   Patient: Julie Acevedo   DOB: 01/20/1951   69 y.o. Female  MRN: [id number] Visit Date: 07/07/2020  Today's healthcare provider: [name], MD   Chief Complaint  Patient presents with  . Back Pain   I,Latasha Walston,acting as a scribe for [name], MD.,have documented all relevant documentation on the behalf of [name], MD,as directed by  [name], MD while in the presence of [name], MD.  Subjective    HPI  Patient feels much better and is pain-free now.  She has no complaints. Low Back Pain: From 06/23/2020-Started Prednisone 10 mg 6 day taper. Started Cyclobenzaprine 5 mg TID as needed. Patient reports good compliance. She states symptoms have resolved.   Past Medical History:  Diagnosis Date  . Anemia   . Arthritis   . COVID-19 11/19/2019  . Diverticulitis   . DM (diabetes mellitus) ([place])    typ e 2  . History of methicillin resistant staphylococcus aureus (MRSA) 2017  . HTN (hypertension)   . Hyperlipidemia   . Memory difficulties 09/01/2015  . Multiple thyroid nodules   . Myocardial infarction ([place]) 1995  . Short-term memory loss        Medications: Outpatient Medications Prior to Visit  Medication Sig  . gabapentin (NEURONTIN) 100 MG capsule Take 100 mg by mouth 2 (two) times daily.  . hydrochlorothiazide (HYDRODIURIL) 12.5 MG tablet Take 1 tablet (12.5 mg total) by mouth daily.  Marland Kitchen LINZESS 290 MCG CAPS capsule TAKE 1 CAPSULE (290 MCG TOTAL) BY MOUTH DAILY BEFORE BREAKFAST.  . metFORMIN (GLUCOPHAGE) 500 MG tablet TAKE 1 TABLET (500 MG TOTAL) BY MOUTH 2 (TWO) TIMES DAILY WITH A MEAL.  . methocarbamol (ROBAXIN) 500 MG tablet Take 500 mg by mouth 2 (two) times daily. Back pain  . metoprolol succinate (TOPROL-XL) 25 MG 24 hr tablet Take 25 mg by mouth daily.  Marland Kitchen omeprazole (PRILOSEC) 20 MG capsule Take 1 capsule (20 mg total) by mouth daily.  . ondansetron (ZOFRAN) 4 MG tablet Take 4 mg  by mouth every 8 (eight) hours as needed.  . traMADol (ULTRAM) 50 MG tablet Take 1 tablet (50 mg total) by mouth every 6 (six) hours as needed.  . traZODone (DESYREL) 50 MG tablet TAKE 1 TABLET (50 MG TOTAL) BY MOUTH AT BEDTIME AS NEEDED FOR SLEEP.  . Vitamin D, Ergocalciferol, (DRISDOL) 1.25 MG (50000 UT) CAPS capsule TAKE 1 CAPSULE (50,000 UNITS TOTAL) BY MOUTH EVERY 7 (SEVEN) DAYS.  . [DISCONTINUED] cyclobenzaprine (FLEXERIL) 5 MG tablet Take 1 tablet (5 mg total) by mouth 3 (three) times daily as needed for muscle spasms.  . [DISCONTINUED] diclofenac Sodium (VOLTAREN) 1 % GEL Voltaren 1 % topical gel  . [DISCONTINUED] meloxicam (MOBIC) 15 MG tablet Take 15 mg by mouth daily.   . [DISCONTINUED] ondansetron (ZOFRAN ODT) 4 MG disintegrating tablet Take 1 tablet (4 mg total) by mouth every 8 (eight) hours as needed for nausea or vomiting. (Patient taking differently: Take 4 mg by mouth every 8 (eight) hours as needed for nausea or vomiting. )   No facility-administered medications prior to visit.    Review of Systems  Constitutional: Negative for appetite change, chills, fatigue and fever.  Respiratory: Negative for chest tightness and shortness of breath.   Cardiovascular: Negative for chest pain and palpitations.  Gastrointestinal: Negative for abdominal pain, nausea and vomiting.  Neurological: Negative for dizziness and weakness.    Last hemoglobin A1c  Lab Results  Component Value Date   HGBA1C 5.6 07/07/2020      Objective    BP 107/71 (BP Location: Right Arm, Patient Position: Sitting, Cuff Size: Normal)   Pulse 76   Temp (!) 97.1 F (36.2 C) (Temporal)   Wt 131 lb 6.4 oz (59.6 kg)   BMI 23.28 kg/m  BP Readings from Last 3 Encounters:  07/07/20 107/71  07/06/20 97/63  06/23/20 115/82   Wt Readings from Last 3 Encounters:  07/07/20 131 lb 6.4 oz (59.6 kg)  07/06/20 131 lb 3.2 oz (59.5 kg)  06/23/20 133 lb (60.3 kg)      Physical Exam   General: Appearance:     Well developed, well nourished female in no acute distress  Eyes:    PERRL, conjunctiva/corneas clear, EOM's intact       Lungs:     Clear to auscultation bilaterally, respirations unlabored  Heart:    Normal heart rate. Normal rhythm. No murmurs, rubs, or gallops.   MS:   All extremities are intact.   Neurologic:   Awake, alert, oriented x 3. No apparent focal neurological           defect.       Results for orders placed or performed in visit on 07/07/20  POCT HgB A1C  Result Value Ref Range   Hemoglobin A1C 5.6 4.0 - 5.6 %   Est. average glucose Bld gHb Est-mCnc 114     Assessment & Plan    1. Low back pain radiating to right lower extremity This pain is resolved.  Follow-up as indicated.  2. Type 2 diabetes mellitus with stage 2 chronic kidney disease, without long-term current use of insulin (HCC) Stop Metformin and follow up in 6 months  - POCT HgB A1C-A1c today is 5.6.  This is now normal.  Stop Metformin.   No follow-ups on file.         Richard Cranford Mon, MD  [facility] [phone] (phone) [phone] (fax)  [place]

## 2020-07-06 ENCOUNTER — Other Ambulatory Visit: Payer: Self-pay

## 2020-07-06 ENCOUNTER — Telehealth: Payer: Self-pay | Admitting: *Deleted

## 2020-07-06 ENCOUNTER — Ambulatory Visit: Payer: Medicare HMO | Admitting: Cardiovascular Disease

## 2020-07-06 ENCOUNTER — Encounter: Payer: Self-pay | Admitting: Cardiovascular Disease

## 2020-07-06 ENCOUNTER — Ambulatory Visit (INDEPENDENT_AMBULATORY_CARE_PROVIDER_SITE_OTHER): Payer: Medicare HMO | Admitting: Cardiovascular Disease

## 2020-07-06 VITALS — BP 97/63 | HR 77 | Ht 63.0 in | Wt 131.2 lb

## 2020-07-06 DIAGNOSIS — E1122 Type 2 diabetes mellitus with diabetic chronic kidney disease: Secondary | ICD-10-CM

## 2020-07-06 DIAGNOSIS — R55 Syncope and collapse: Secondary | ICD-10-CM

## 2020-07-06 DIAGNOSIS — R079 Chest pain, unspecified: Secondary | ICD-10-CM | POA: Diagnosis not present

## 2020-07-06 DIAGNOSIS — Z95818 Presence of other cardiac implants and grafts: Secondary | ICD-10-CM | POA: Diagnosis not present

## 2020-07-06 DIAGNOSIS — I951 Orthostatic hypotension: Secondary | ICD-10-CM | POA: Diagnosis not present

## 2020-07-06 DIAGNOSIS — I10 Essential (primary) hypertension: Secondary | ICD-10-CM

## 2020-07-06 DIAGNOSIS — N1832 Chronic kidney disease, stage 3b: Secondary | ICD-10-CM

## 2020-07-06 DIAGNOSIS — E782 Mixed hyperlipidemia: Secondary | ICD-10-CM | POA: Diagnosis not present

## 2020-07-06 DIAGNOSIS — E1121 Type 2 diabetes mellitus with diabetic nephropathy: Secondary | ICD-10-CM

## 2020-07-06 MED ORDER — HYDROCHLOROTHIAZIDE 12.5 MG PO TABS: 12.5000 mg | ORAL_TABLET | Freq: Every day | ORAL | 3 refills | Status: DC

## 2020-07-06 NOTE — Telephone Encounter (Signed)
Left a message for the patient to call back. Per Dr. Sallyanne Kuster, she will need to decrease her Hydrochlorothiazide to 12.5 mg. Her medication list has already been updated.

## 2020-07-06 NOTE — Progress Notes (Signed)
Cardiology office Note   Date:  07/06/2020   ID:  Dillyn, Joaquin 1951-09-20, MRN [id number]  PCP:  Jerrol Banana., MD  Cardiologist:  Yolonda Kida, MD /Iokepa Geffre (ILR) Electrophysiologist:  None   Evaluation Performed:  Follow-Up Visit  Chief Complaint:  Dizziness/ILR  History of Present Illness:    Julie Acevedo is a 69 y.o. female with history of a single syncopal event in April 2018 with some features concerning for arrhythmia.  She also has diet-controlled diabetes mellitus with chronic kidney disease (stage III), mild hypertension, occasional palpitations and hyperlipidemia.  She does not have known coronary or peripheral vascular disease.  She has not had syncope since loop recorder implantation.  She continues to have occasional episodes of dizziness and lightheadedness when she changes position rapidly, consistent with orthostatic hypotension.  Her blood pressure is quite low today although she has no complaints.  She has not had palpitations and denies shortness of breath with activity, orthopnea, PND.  She has occasional mild ankle edema towards the end of the day.  She does complain of random episodes of aching in her chest that occur at rest and are not worsened by physical activity.  They can last anywhere from minutes to a full half a day.  They are never severe.  There is no clear association with position, breathing or meals.  Her loop recorder has not recorded any significant events of bradycardia or tachycardia or atrial fibrillation.  Been in place now for about 2 years.   Past Medical History:  Diagnosis Date  . Anemia   . Arthritis   . COVID-19 11/19/2019  . Diverticulitis   . DM (diabetes mellitus) ([place])    typ e 2  . History of methicillin resistant staphylococcus aureus (MRSA) 2017  . HTN (hypertension)   . Hyperlipidemia   . Memory difficulties 09/01/2015  . Multiple thyroid nodules   . Myocardial infarction ([place]) 1995  . Short-term  memory loss    Past Surgical History:  Procedure Laterality Date  . ABDOMINAL HYSTERECTOMY     due to endometriosis-1 ovary left  . BREAST EXCISIONAL BIOPSY Right 1971   neg  . CARDIAC CATHETERIZATION  2000   no significant CAD per note of Dr Caryl Comes in 2013   . CHOLECYSTECTOMY    . CYSTOCELE REPAIR N/A 03/19/2017   Procedure: ANTERIOR REPAIR (CYSTOCELE);  Surgeon: Bjorn Loser, MD;  [facility];  Service: Urology;  Laterality: N/A;  . CYSTOSCOPY N/A 03/19/2017   Procedure: CYSTOSCOPY;  Surgeon: Bjorn Loser, MD;  [facility];  Service: Urology;  Laterality: N/A;  . EYE SURGERY    . FOOT SURGERY    . HAND SURGERY    . HEMICOLECTOMY    . INSERTION OF MESH N/A 08/21/2016   Procedure: INSERTION OF MESH;  Surgeon: Excell Seltzer, MD;  [facility];  Service: General;  Laterality: N/A;  . INSERTION OF MESH N/A 12/08/2019   Procedure: INSERTION OF MESH;  Surgeon: Jules Husbands, MD;  Location: [facility];  Service: General;  Laterality: N/A;  . LAPAROSCOPIC LYSIS OF ADHESIONS N/A 08/21/2016   Procedure: LAPAROSCOPIC LYSIS OF ADHESIONS;  Surgeon: Excell Seltzer, MD;  [facility];  Service: General;  Laterality: N/A;  . LAPAROSCOPIC REMOVAL ABDOMINAL MASS    . LAPAROTOMY N/A 04/16/2019   Procedure: EXPLORATORY LAPAROTOMY;  Surgeon: Benjamine Sprague, DO;  Location: [facility];  Service: General;  Laterality: N/A;  . LOOP RECORDER INSERTION N/A 03/21/2018  Procedure: LOOP RECORDER INSERTION;  Surgeon: Sanda Klein, MD;  Location: [facility];  Service: Cardiovascular;  Laterality: N/A;  . LUMBAR [place]    . ROUX-EN-Y GASTRIC BYPASS  2010   Dr Hassell Done  . TONSILLECTOMY    . UPPER GI ENDOSCOPY  01/12/16   normal larynx, normal esophagus. gastric bypass with a normal-sized pouch and intact staple line, normal examined jejunum, otherwise exam was normal  . VENTRAL HERNIA REPAIR N/A 08/21/2016   Procedure: LAPAROSCOPIC VENTRAL HERNIA;  Surgeon: Excell Seltzer, MD;   [facility];  Service: General;  Laterality: N/A;  . XI ROBOTIC ASSISTED VENTRAL HERNIA N/A 12/08/2019   Procedure: XI ROBOTIC ASSISTED VENTRAL HERNIA;  Surgeon: Jules Husbands, MD;  Location: [facility];  Service: General;  Laterality: N/A;     Current Meds  Medication Sig  . cyclobenzaprine (FLEXERIL) 5 MG tablet Take 1 tablet (5 mg total) by mouth 3 (three) times daily as needed for muscle spasms.  . diclofenac Sodium (VOLTAREN) 1 % GEL Voltaren 1 % topical gel  . gabapentin (NEURONTIN) 100 MG capsule Take 100 mg by mouth 2 (two) times daily.  . hydrochlorothiazide (HYDRODIURIL) 12.5 MG tablet Take 1 tablet (12.5 mg total) by mouth daily.  Marland Kitchen LINZESS 290 MCG CAPS capsule TAKE 1 CAPSULE (290 MCG TOTAL) BY MOUTH DAILY BEFORE BREAKFAST.  . meloxicam (MOBIC) 15 MG tablet Take 15 mg by mouth daily.   . metFORMIN (GLUCOPHAGE) 500 MG tablet TAKE 1 TABLET (500 MG TOTAL) BY MOUTH 2 (TWO) TIMES DAILY WITH A MEAL.  . methocarbamol (ROBAXIN) 500 MG tablet Take 500 mg by mouth 2 (two) times daily. Back pain  . metoprolol succinate (TOPROL-XL) 25 MG 24 hr tablet Take 25 mg by mouth daily.  Marland Kitchen omeprazole (PRILOSEC) 20 MG capsule Take 1 capsule (20 mg total) by mouth daily.  . ondansetron (ZOFRAN ODT) 4 MG disintegrating tablet Take 1 tablet (4 mg total) by mouth every 8 (eight) hours as needed for nausea or vomiting. (Patient taking differently: Take 4 mg by mouth every 8 (eight) hours as needed for nausea or vomiting. )  . ondansetron (ZOFRAN) 4 MG tablet Take 4 mg by mouth every 8 (eight) hours as needed.  . traMADol (ULTRAM) 50 MG tablet Take 1 tablet (50 mg total) by mouth every 6 (six) hours as needed.  . traZODone (DESYREL) 50 MG tablet TAKE 1 TABLET (50 MG TOTAL) BY MOUTH AT BEDTIME AS NEEDED FOR SLEEP.  . Vitamin D, Ergocalciferol, (DRISDOL) 1.25 MG (50000 UT) CAPS capsule TAKE 1 CAPSULE (50,000 UNITS TOTAL) BY MOUTH EVERY 7 (SEVEN) DAYS.  . [DISCONTINUED] hydrochlorothiazide (HYDRODIURIL) 25 MG  tablet Take 1 tablet (25 mg total) by mouth daily.     Allergies:   Lotrel [amlodipine besy-benazepril hcl], Contrast media [iodinated diagnostic agents], Niacin, and Sulfa antibiotics   Social History   Tobacco Use  . Smoking status: Never Smoker  . Smokeless tobacco: Never Used  Vaping Use  . Vaping Use: Never used  Substance Use Topics  . Alcohol use: No  . Drug use: No     Family Hx: The patient's family history includes Breast cancer in her paternal grandmother; Breast cancer (age of onset: 76) in her paternal aunt; Breast cancer (age of onset: 53) in her paternal aunt; COPD in her mother; Cancer in her paternal aunt and paternal grandmother; Cancer (age of onset: 46) in her father; Congestive Heart Failure in her maternal grandmother; Coronary artery disease (age of onset: 71)  in her father; Diabetes in her mother; Emphysema in her maternal grandfather; Heart attack in her paternal grandfather; Hypertension in her mother.  ROS:   Please see the history of present illness.    All other systems are reviewed and are negative.   Prior CV studies:   The following studies were reviewed today: Loop recorder check today shows normal findings, no recorded episodes.  Activity level is fairly steady about 2 hours/day.  She has a nice separation between average daytime and nighttime heart rates.  Labs/Other Tests and Data Reviewed:    EKG: An ECG dated 04/01/2013 shows normal sinus rhythm, normal sinus  ECHO 03/19/2018 - Left ventricle: The cavity size was normal. Wall thickness was normal. Systolic function was normal. The estimated ejection fraction was in the range of 60% to 65%. Wall motion was normal; there were no regional wall motion abnormalities.  ECHO 10/27/2018 NORMAL LEFT VENTRICULAR SYSTOLIC FUNCTION WITH AN ESTIMATED EF = 55 %  NORMAL RIGHT VENTRICULAR SYSTOLIC FUNCTION  MILD-TO-MODERATE TRICUSPID VALVE INSUFFICIENCY  MILD MITRAL VALVE INSUFFICIENCY  NO VALVULAR  STENOSIS  MILD LA ENLARGEMENT    Nuclear stress test 10/28/2018 Impression Normal myocardial perfusion scan no evidence of stress-induced  myocardial ischemia ejection fraction of 59% conclusion negative scan   Recent Labs: 12/17/2019: ALT 7 03/31/2020: BUN 22; Creatinine, Ser 1.61; Potassium 4.8; Sodium 141 05/09/2020: Hemoglobin 10.1; Platelets 282   Recent Lipid Panel Lab Results  Component Value Date/Time   CHOL 133 06/26/2018 09:23 AM   CHOL 154 01/18/2014 06:32 AM   TRIG 51 06/26/2018 09:23 AM   TRIG 53 01/18/2014 06:32 AM   HDL 63 06/26/2018 09:23 AM   HDL 74 (H) 01/18/2014 06:32 AM   CHOLHDL 2.0 11/16/2012 05:00 AM   LDLCALC 60 06/26/2018 09:23 AM   LDLCALC 69 01/18/2014 06:32 AM    Wt Readings from Last 3 Encounters:  07/06/20 131 lb 3.2 oz (59.5 kg)  06/23/20 133 lb (60.3 kg)  05/15/20 132 lb (59.9 kg)     Objective:    Vital Signs:  BP 97/63   Pulse 77   Ht 5\' 3"  (1.6 m)   Wt 131 lb 3.2 oz (59.5 kg)   SpO2 100%   BMI 23.24 kg/m     General: Alert, oriented x3, no distress, lean Head: no evidence of trauma, PERRL, EOMI, no exophtalmos or lid lag, no myxedema, no xanthelasma; normal ears, nose and oropharynx Neck: normal jugular venous pulsations and no hepatojugular reflux; brisk carotid pulses without delay and no carotid bruits Chest: clear to auscultation, no signs of consolidation by percussion or palpation, normal fremitus, symmetrical and full respiratory excursions Cardiovascular: normal position and quality of the apical impulse, regular rhythm, normal first and second heart sounds, no murmurs, rubs or gallops Abdomen: no tenderness or distention, no masses by palpation, no abnormal pulsatility or arterial bruits, normal bowel sounds, no hepatosplenomegaly Extremities: no clubbing, cyanosis or edema; 2+ radial, ulnar and brachial pulses bilaterally; 2+ right femoral, posterior tibial and dorsalis pedis pulses; 2+ left femoral, posterior tibial and  dorsalis pedis pulses; no subclavian or femoral bruits Neurological: grossly nonfocal Psych: Normal mood and affect The implantable loop recorder site appears healthy.  ASSESSMENT & PLAN:    1. Vasovagal syncope   2. Orthostatic hypotension   3. Essential hypertension   4. Status post placement of implantable loop recorder   5. Mixed hyperlipidemia   6. Stage 3b chronic kidney disease   7. Type 2 diabetes mellitus with stage  3b chronic kidney disease, without long-term current use of insulin ([place])   8. Chest pain at rest      1. Syncope: No recurrent episodes since loop recorder implantation.  Suspect the initial event may have been due to hypotension. 2. Orthostatic hypotension : Blood pressure control is excessive.  To reduce the hydrochlorothiazide to 12.5 mg once daily. 3. HTN: Excellent control.  4. ILR: Normal device function.  Continue monthly downloads.  No arrhythmia to date. 5. HLP: All lipid parameters in target range 6. CKD 3: Most recent creatinine 1.61 April 2021, similar to previous values of 1.40-1.73 throughout 2020 7. DM: Do not have a recent A1c to review, but she is on Metformin monotherapy. 8. Chest pain: Symptoms do not sound consistent with coronary insufficiency or other cardiovascular cardiac illness, had relatively recent normal stress test and echocardiogram.  Suspect musculoskeletal etiology.   COVID-19 Education: The signs and symptoms of COVID-19 were discussed with the patient and how to seek care for testing (follow up with PCP or arrange E-visit).  The importance of social distancing was discussed today.  Time:   Today, I have spent 21 minutes with the patient with telehealth technology discussing the above problems.     Medication Adjustments/Labs and Tests Ordered: Current medicines are reviewed at length with the patient today.  Concerns regarding medicines are outlined above.   Patient Instructions  Medication Instructions:  DECREASE the  Hydrochlorothiazide to 12.5 mg once daily  *If you need a refill on your cardiac medications before your next appointment, please call your pharmacy*   Lab Work: None ordered If you have labs (blood work) drawn today and your tests are completely normal, you will receive your results only by: Marland Kitchen MyChart Message (if you have MyChart) OR . A paper copy in the mail If you have any lab test that is abnormal or we need to change your treatment, we will call you to review the results.   Testing/Procedures: None ordered   Follow-Up: At [facility], you and your health needs are our priority.  As part of our continuing mission to provide you with exceptional heart care, we have created designated Provider Care Teams.  These Care Teams include your primary Cardiologist (physician) and Advanced Practice Providers (APPs -  Physician Assistants and Nurse Practitioners) who all work together to provide you with the care you need, when you need it.  We recommend signing up for the patient portal called "MyChart".  Sign up information is provided on this After Visit Summary.  MyChart is used to connect with patients for Virtual Visits (Telemedicine).  Patients are able to view lab/test results, encounter notes, upcoming appointments, etc.  Non-urgent messages can be sent to your provider as well.   To learn more about what you can do with MyChart, go to [email or web address].    Your next appointment:   12 month(s)  The format for your next appointment:   In Person  Provider:   You may see Sanda Klein, MD or one of the following Advanced Practice Providers on your designated Care Team:    Almyra Deforest, PA-C  Fabian Sharp, PA-C or   Roby Lofts, [place]       Tests Ordered: No orders of the defined types were placed in this encounter.  Follow Up:  Virtual Visit or In Person 1 year  Signed, Sanda Klein, MD  07/06/2020 10:38 AM    [place]

## 2020-07-06 NOTE — Patient Instructions (Addendum)
Medication Instructions:  DECREASE the Hydrochlorothiazide to 12.5 mg once daily  *If you need a refill on your cardiac medications before your next appointment, please call your pharmacy*   Lab Work: None ordered If you have labs (blood work) drawn today and your tests are completely normal, you will receive your results only by: Marland Kitchen MyChart Message (if you have MyChart) OR . A paper copy in the mail If you have any lab test that is abnormal or we need to change your treatment, we will call you to review the results.   Testing/Procedures: None ordered   Follow-Up: At [facility], you and your health needs are our priority.  As part of our continuing mission to provide you with exceptional heart care, we have created designated Provider Care Teams.  These Care Teams include your primary Cardiologist (physician) and Advanced Practice Providers (APPs -  Physician Assistants and Nurse Practitioners) who all work together to provide you with the care you need, when you need it.  We recommend signing up for the patient portal called "MyChart".  Sign up information is provided on this After Visit Summary.  MyChart is used to connect with patients for Virtual Visits (Telemedicine).  Patients are able to view lab/test results, encounter notes, upcoming appointments, etc.  Non-urgent messages can be sent to your provider as well.   To learn more about what you can do with MyChart, go to [email or web address].    Your next appointment:   12 month(s)  The format for your next appointment:   In Person  Provider:   You may see Sanda Klein, MD or one of the following Advanced Practice Providers on your designated Care Team:    Almyra Deforest, PA-C  Fabian Sharp, PA-C or   Roby Lofts, [place]

## 2020-07-07 ENCOUNTER — Encounter: Payer: Self-pay | Admitting: Family Medicine

## 2020-07-07 ENCOUNTER — Ambulatory Visit (INDEPENDENT_AMBULATORY_CARE_PROVIDER_SITE_OTHER): Payer: Medicare HMO | Admitting: Family Medicine

## 2020-07-07 VITALS — BP 107/71 | HR 76 | Temp 97.1°F | Wt 131.4 lb

## 2020-07-07 DIAGNOSIS — M545 Low back pain, unspecified: Secondary | ICD-10-CM

## 2020-07-07 DIAGNOSIS — N182 Chronic kidney disease, stage 2 (mild): Secondary | ICD-10-CM

## 2020-07-07 DIAGNOSIS — M79604 Pain in right leg: Secondary | ICD-10-CM

## 2020-07-07 DIAGNOSIS — E1122 Type 2 diabetes mellitus with diabetic chronic kidney disease: Secondary | ICD-10-CM | POA: Diagnosis not present

## 2020-07-07 LAB — POCT GLYCOSYLATED HEMOGLOBIN (HGB A1C)
Est. average glucose Bld gHb Est-mCnc: 114
Hemoglobin A1C: 5.6 % (ref 4.0–5.6)

## 2020-07-07 NOTE — Telephone Encounter (Signed)
Left a message for the patient to call back.  

## 2020-07-07 NOTE — Patient Instructions (Signed)
STOP METFORMIN.

## 2020-07-07 NOTE — Telephone Encounter (Signed)
The patient has been made aware and verbalized her understanding.  

## 2020-07-12 ENCOUNTER — Ambulatory Visit: Payer: Medicare HMO | Admitting: Gastroenterology

## 2020-07-12 NOTE — Progress Notes (Deleted)
Jonathon Bellows MD, MRCP(U.K) [address]  [place]  [place], [place] [address]  Main: [phone]  Fax: [phone]   Gastroenterology Consultation  Referring Provider:     Jules Husbands, MD Primary Care Physician:  Jerrol Banana., MD Primary Gastroenterologist:  Dr. Jonathon Bellows  Reason for Consultation:     Abdominal pain         HPI:   Julie Acevedo is a 69 y.o. y/o female referred for consultation & management  by Dr. Rosanna Randy, Retia Passe., MD.    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 Anemia   . Arthritis   . COVID-19 11/19/2019  . Diverticulitis   . DM (diabetes mellitus) ([place])    typ e 2  . History of methicillin resistant staphylococcus aureus (MRSA) 2017  . HTN (hypertension)   . Hyperlipidemia   . Memory difficulties 09/01/2015  . Multiple thyroid nodules   . Myocardial infarction ([place]) 1995  . Short-term memory loss     Past Surgical History:  Procedure Laterality Date  . ABDOMINAL HYSTERECTOMY     due to endometriosis-1 ovary left  . BREAST EXCISIONAL BIOPSY Right 1971   neg  . CARDIAC CATHETERIZATION  2000   no significant CAD per note of Dr Caryl Comes in 2013   . CHOLECYSTECTOMY    . CYSTOCELE REPAIR N/A 03/19/2017   Procedure: ANTERIOR REPAIR (CYSTOCELE);  Surgeon: Bjorn Loser, MD;  [facility];  Service: Urology;  Laterality: N/A;  . CYSTOSCOPY N/A 03/19/2017   Procedure: CYSTOSCOPY;  Surgeon: Bjorn Loser, MD;  [facility];  Service: Urology;  Laterality: N/A;  . EYE SURGERY    . FOOT SURGERY    . HAND SURGERY    . HEMICOLECTOMY    . INSERTION OF MESH N/A 08/21/2016   Procedure: INSERTION OF MESH;  Surgeon: Excell Seltzer, MD;  [facility];  Service: General;  Laterality: N/A;  . INSERTION OF MESH N/A 12/08/2019   Procedure: INSERTION OF MESH;  Surgeon: Jules Husbands,  MD;  Location: [facility];  Service: General;  Laterality: N/A;  . LAPAROSCOPIC LYSIS OF ADHESIONS N/A 08/21/2016   Procedure: LAPAROSCOPIC LYSIS OF ADHESIONS;  Surgeon: Excell Seltzer, MD;  [facility];  Service: General;  Laterality: N/A;  . LAPAROSCOPIC REMOVAL ABDOMINAL MASS    . LAPAROTOMY N/A 04/16/2019   Procedure: EXPLORATORY LAPAROTOMY;  Surgeon: Benjamine Sprague, DO;  Location: [facility];  Service: General;  Laterality: N/A;  . LOOP RECORDER INSERTION N/A 03/21/2018   Procedure: LOOP RECORDER INSERTION;  Surgeon: Sanda Klein, MD;  Location: [facility];  Service: Cardiovascular;  Laterality: N/A;  . LUMBAR [place]    . ROUX-EN-Y GASTRIC BYPASS  2010   Dr Hassell Done  . TONSILLECTOMY    . UPPER GI ENDOSCOPY  01/12/16   normal larynx, normal esophagus. gastric bypass with a normal-sized pouch and intact staple line, normal examined jejunum, otherwise exam was normal  . VENTRAL HERNIA REPAIR N/A 08/21/2016   Procedure: LAPAROSCOPIC VENTRAL HERNIA;  Surgeon: Excell Seltzer, MD;  [facility];  Service: General;  Laterality: N/A;  . XI ROBOTIC ASSISTED VENTRAL HERNIA N/A 12/08/2019   Procedure: XI ROBOTIC ASSISTED VENTRAL HERNIA;  Surgeon: Jules Husbands, MD;  Location: [facility];  Service: General;  Laterality: N/A;    Prior to Admission  medications   Medication Sig Start Date End Date Taking? Authorizing Provider  gabapentin (NEURONTIN) 100 MG capsule Take 100 mg by mouth 2 (two) times daily.    [provider]  hydrochlorothiazide (HYDRODIURIL) 12.5 MG tablet Take 1 tablet (12.5 mg total) by mouth daily. 07/06/20   Croitoru, Mihai, MD  LINZESS 290 MCG CAPS capsule TAKE 1 CAPSULE (290 MCG TOTAL) BY MOUTH DAILY BEFORE BREAKFAST. 03/16/20   Jerrol Banana., MD  metFORMIN (GLUCOPHAGE) 500 MG tablet TAKE 1 TABLET (500 MG TOTAL) BY MOUTH 2 (TWO) TIMES DAILY WITH A MEAL. 12/05/19   Jerrol Banana., MD  methocarbamol (ROBAXIN) 500 MG tablet Take 500 mg by  mouth 2 (two) times daily. Back pain 05/22/19   [provider]  metoprolol succinate (TOPROL-XL) 25 MG 24 hr tablet Take 25 mg by mouth daily. 04/28/20   [provider]  omeprazole (PRILOSEC) 20 MG capsule Take 1 capsule (20 mg total) by mouth daily. 04/28/20   Jerrol Banana., MD  ondansetron (ZOFRAN) 4 MG tablet Take 4 mg by mouth every 8 (eight) hours as needed. 03/17/20   [provider]  traMADol (ULTRAM) 50 MG tablet Take 1 tablet (50 mg total) by mouth every 6 (six) hours as needed. 03/15/20   Jerrol Banana., MD  traZODone (DESYREL) 50 MG tablet TAKE 1 TABLET (50 MG TOTAL) BY MOUTH AT BEDTIME AS NEEDED FOR SLEEP. 08/14/19   Jerrol Banana., MD  Vitamin D, Ergocalciferol, (DRISDOL) 1.25 MG (50000 UT) CAPS capsule TAKE 1 CAPSULE (50,000 UNITS TOTAL) BY MOUTH EVERY 7 (SEVEN) DAYS. 12/07/19   Jerrol Banana., MD  diphenhydrAMINE (BENADRYL) 50 MG tablet Patient is to take one tablet 1 hour before test. Patient not taking: Reported on 04/28/2020 04/04/20 05/15/20  Jules Husbands, MD    Family History  Problem Relation Age of Onset  . Cancer Father 109       Lung  . Coronary artery disease Father 49  . COPD Mother   . Diabetes Mother   . Hypertension Mother   . Cancer Paternal Grandmother        Breast  . Breast cancer Paternal Grandmother   . Congestive Heart Failure Maternal Grandmother   . Emphysema Maternal Grandfather   . Heart attack Paternal Grandfather   . Cancer Paternal Aunt        Breast  . Breast cancer Paternal Aunt 59  . Breast cancer Paternal Aunt 53     Social History   Tobacco Use  . Smoking status: Never Smoker  . Smokeless tobacco: Never Used  Vaping Use  . Vaping Use: Never used  Substance Use Topics  . Alcohol use: No  . Drug use: No    Allergies as of 07/12/2020 - Review Complete 07/07/2020  Allergen Reaction Noted  . Lotrel [amlodipine besy-benazepril hcl] Anaphylaxis 01/27/2012  . Contrast media  [iodinated diagnostic agents] Swelling and Other (See Comments) 06/11/2015  . Niacin Hives 06/11/2015  . Sulfa antibiotics Hives 01/27/2012    Review of Systems:    All systems reviewed and negative except where noted in HPI.   Physical Exam:  There were no vitals taken for this visit. No LMP recorded. Patient has had a hysterectomy. Psych:  Alert and cooperative. Normal mood and affect. General:   Alert,  Well-developed, well-nourished, pleasant and cooperative in NAD Head:  Normocephalic and atraumatic. Eyes:  Sclera clear, no icterus.   Conjunctiva pink. Lungs:  Respirations  even and unlabored.  Clear throughout to auscultation.   No wheezes, crackles, or rhonchi. No acute distress. Heart:  Regular rate and rhythm; no murmurs, clicks, rubs, or gallops. Abdomen:  Normal bowel sounds.  No bruits.  Soft, non-tender and non-distended without masses, hepatosplenomegaly or hernias noted.  No guarding or rebound tenderness.    Neurologic:  Alert and oriented x3;  grossly normal neurologically. Psych:  Alert and cooperative. Normal mood and affect.  Imaging Studies: CUP PACEART REMOTE DEVICE CHECK  Result Date: 06/17/2020 Carelink summary report received. Battery status OK. Normal device function. No new symptom episodes, tachy episodes, brady, or pause episodes. No new AF episodes. Monthly summary reports and ROV/PRN.  [facility]   Assessment and Plan:   Julie Acevedo is a 69 y.o. y/o female has been referred for ***   Plan  1. EGD+ colonoscopy  2. H pylori breath test   Follow up in ***  Dr Jonathon Bellows MD,MRCP(U.K)

## 2020-07-20 ENCOUNTER — Other Ambulatory Visit: Payer: Self-pay | Admitting: Family Medicine

## 2020-07-20 ENCOUNTER — Other Ambulatory Visit: Payer: Self-pay

## 2020-07-20 DIAGNOSIS — K219 Gastro-esophageal reflux disease without esophagitis: Secondary | ICD-10-CM

## 2020-07-20 MED ORDER — ONDANSETRON HCL 4 MG PO TABS: 4.0000 mg | ORAL_TABLET | Freq: Three times a day (TID) | ORAL | 0 refills | Status: DC | PRN

## 2020-07-21 ENCOUNTER — Other Ambulatory Visit: Payer: Self-pay | Admitting: Family Medicine

## 2020-07-21 DIAGNOSIS — E559 Vitamin D deficiency, unspecified: Secondary | ICD-10-CM

## 2020-07-21 NOTE — Progress Notes (Signed)
I,[name],acting as a scribe for [name], MD.,have documented all relevant documentation on the behalf of [name], MD,as directed by  [name], MD while in the presence of [name], MD.   Established patient visit   Patient: Julie Acevedo   DOB: 07-Nov-1951   69 y.o. Female  MRN: [id number] Visit Date: 07/25/2020  Today's healthcare provider: [name], MD   Chief Complaint  Patient presents with  . Blister   Subjective    HPI HPI    Left Bottom   Last edited by Julieta Bellini, CMA on 07/25/2020 10:50 AM. (History)      Patient is here concerning blister on left foot. Blister is on bottom of left foot, in the canter. Patient states blister has been on her foot for around one week. Patient states there is pus in the blister. Patient states there was no injury mechanism. Left foot it is sore to walk on. Patient was treated for a diabetic infection in the same area of foot back in May 2021 at the ED. Patient states she has been using over-the-counter [place] Foot Cream on blister with no improvement.       Medications: Outpatient Medications Prior to Visit  Medication Sig  . gabapentin (NEURONTIN) 100 MG capsule Take 100 mg by mouth 2 (two) times daily.  . hydrochlorothiazide (HYDRODIURIL) 12.5 MG tablet Take 1 tablet (12.5 mg total) by mouth daily.  Marland Kitchen LINZESS 290 MCG CAPS capsule TAKE 1 CAPSULE (290 MCG TOTAL) BY MOUTH DAILY BEFORE BREAKFAST.  . metFORMIN (GLUCOPHAGE) 500 MG tablet TAKE 1 TABLET (500 MG TOTAL) BY MOUTH 2 (TWO) TIMES DAILY WITH A MEAL.  . methocarbamol (ROBAXIN) 500 MG tablet Take 500 mg by mouth 2 (two) times daily. Back pain  . metoprolol succinate (TOPROL-XL) 25 MG 24 hr tablet Take 25 mg by mouth daily.  Marland Kitchen omeprazole (PRILOSEC) 20 MG capsule TAKE 1 CAPSULE BY MOUTH EVERY DAY  . ondansetron (ZOFRAN) 4 MG tablet Take 1 tablet (4 mg total) by mouth every 8 (eight) hours as needed.  . traMADol (ULTRAM) 50  MG tablet Take 1 tablet (50 mg total) by mouth every 6 (six) hours as needed.  . traZODone (DESYREL) 50 MG tablet TAKE 1 TABLET (50 MG TOTAL) BY MOUTH AT BEDTIME AS NEEDED FOR SLEEP.  . Vitamin D, Ergocalciferol, (DRISDOL) 1.25 MG (50000 UNIT) CAPS capsule TAKE 1 CAPSULE (50,000 UNITS TOTAL) BY MOUTH EVERY 7 (SEVEN) DAYS.   No facility-administered medications prior to visit.    Review of Systems  Constitutional: Negative for appetite change, chills, fatigue and fever.  Respiratory: Negative for chest tightness and shortness of breath.   Cardiovascular: Negative for chest pain and palpitations.  Gastrointestinal: Negative for abdominal pain, nausea and vomiting.  Neurological: Negative for dizziness and weakness.       Objective    BP 98/66 (BP Location: Right Arm, Patient Position: Sitting, Cuff Size: Normal)   Pulse 69   Temp 98.5 F (36.9 C) (Other (Comment))   Resp 16   Ht 5\' 3"  (1.6 m)   Wt 133 lb (60.3 kg)   SpO2 99%   BMI 23.56 kg/m     Physical Exam HENT:     Head: Normocephalic and atraumatic.  Eyes:     General: No scleral icterus. Cardiovascular:     Heart sounds: Normal heart sounds.  Pulmonary:     Effort: Pulmonary effort is normal.  Breath sounds: Normal breath sounds.  Abdominal:     Palpations: Abdomen is soft.  Skin:    Comments: She has an olive size blister on the middle of the foot.  Posterior is intact and it appears to be some yellow drainage the skin.  Mild tenderness around the blister.  Neurological:     Mental Status: She is alert and oriented to person, place, and time.  Psychiatric:        Mood and Affect: Mood normal.        Behavior: Behavior normal.        Thought Content: Thought content normal.        Judgment: Judgment normal.       No results found for any visits on 07/25/20.  Assessment & Plan     1. Diabetic infection of left foot [facility]) Patient also has a history of back problems and gastric bypass which may be  contributing to neuropathy and lack of sensation. - amoxicillin (AMOXIL) 500 MG capsule; Take 1 capsule (500 mg total) by mouth 3 (three) times daily.  Dispense: 21 capsule; Refill: 0 - Ambulatory referral to Podiatry  2. L-S radiculopathy   3. B12 deficiency   4. Gastric bypass status for obesity   5. Avitaminosis D    No follow-ups on file.         Efe Fazzino Cranford Mon, MD  [facility] [phone] (phone) [phone] (fax)  [place]

## 2020-07-21 NOTE — Telephone Encounter (Signed)
Requested medication (s) are due for refill today: yes  Requested medication (s) are on the active medication list: yes  Last refill:  05/02/2020  Future visit scheduled: yes  Notes to clinic:  this refill cannot be delegated    Requested Prescriptions  Pending Prescriptions Disp Refills   Vitamin D, Ergocalciferol, (DRISDOL) 1.25 MG (50000 UNIT) CAPS capsule [Pharmacy Med Name: VITAMIN D2 1.25MG (50,000 UNIT)] 12 capsule 1    Sig: TAKE 1 CAPSULE (50,000 UNITS TOTAL) BY MOUTH EVERY 7 (SEVEN) DAYS.      Endocrinology:  Vitamins - Vitamin D Supplementation Failed - 07/21/2020  9:03 AM      Failed - 50,000 IU strengths are not delegated      Failed - Phosphate in normal range and within 360 days    No results found for: PHOS        Failed - Vitamin D in normal range and within 360 days    Vit D, 25-Hydroxy  Date Value Ref Range Status  06/16/2019 8.7 (L) 30.0 - 100.0 ng/mL Final    Comment:    Vitamin D deficiency has been defined by the Institute of Medicine and an Endocrine Society practice guideline as a level of serum 25-OH vitamin D less than 20 ng/mL (1,2). The Endocrine Society went on to further define vitamin D insufficiency as a level between 21 and 29 ng/mL (2). 1. IOM (Institute of Medicine). 2010. Dietary reference    intakes for calcium and D. [place]: The    [employer]. 2. Holick MF, Binkley Bancroft, Bischoff-Ferrari HA, et al.    Evaluation, treatment, and prevention of vitamin D    deficiency: an Endocrine Society clinical practice    guideline. JCEM. 2011 Jul; 96(7):1911-30.           Passed - Ca in normal range and within 360 days    Calcium  Date Value Ref Range Status  03/31/2020 9.0 8.9 - 10.3 mg/dL Final   Calcium, Total  Date Value Ref Range Status  02/17/2014 9.4 8.5 - 10.1 mg/dL Final   Calcium, Ion  Date Value Ref Range Status  12/08/2019 1.20 1.15 - 1.40 mmol/L Final          Passed - Valid encounter within last 12 months     Recent Outpatient Visits           2 weeks ago Low back pain radiating to right lower extremity   [facility] Jerrol Banana., MD   4 weeks ago Low back pain radiating to right lower extremity   [facility] Jerrol Banana., MD   2 months ago Gastroesophageal reflux disease, unspecified whether esophagitis present   [facility] Jerrol Banana., MD   2 months ago Neck pain   [facility] Jerrol Banana., MD   4 months ago L-S radiculopathy   [facility] Jerrol Banana., MD       Future Appointments             In 4 days Jerrol Banana., MD [facility], [place]   In 4 weeks Jerrol Banana., MD [facility], [place]   In 5 months Jerrol Banana., MD [facility], [place]

## 2020-07-25 ENCOUNTER — Encounter: Payer: Self-pay | Admitting: Family Medicine

## 2020-07-25 ENCOUNTER — Ambulatory Visit (INDEPENDENT_AMBULATORY_CARE_PROVIDER_SITE_OTHER): Payer: Medicare HMO | Admitting: Family Medicine

## 2020-07-25 ENCOUNTER — Other Ambulatory Visit: Payer: Self-pay

## 2020-07-25 VITALS — BP 98/66 | HR 69 | Temp 98.5°F | Resp 16 | Ht 63.0 in | Wt 133.0 lb

## 2020-07-25 DIAGNOSIS — Z9884 Bariatric surgery status: Secondary | ICD-10-CM | POA: Diagnosis not present

## 2020-07-25 DIAGNOSIS — L089 Local infection of the skin and subcutaneous tissue, unspecified: Secondary | ICD-10-CM | POA: Diagnosis not present

## 2020-07-25 DIAGNOSIS — M5417 Radiculopathy, lumbosacral region: Secondary | ICD-10-CM

## 2020-07-25 DIAGNOSIS — E538 Deficiency of other specified B group vitamins: Secondary | ICD-10-CM | POA: Diagnosis not present

## 2020-07-25 DIAGNOSIS — E559 Vitamin D deficiency, unspecified: Secondary | ICD-10-CM

## 2020-07-25 DIAGNOSIS — E11628 Type 2 diabetes mellitus with other skin complications: Secondary | ICD-10-CM | POA: Diagnosis not present

## 2020-07-25 MED ORDER — AMOXICILLIN 500 MG PO CAPS: 500.0000 mg | ORAL_CAPSULE | Freq: Three times a day (TID) | ORAL | 0 refills | Status: DC

## 2020-07-27 ENCOUNTER — Encounter: Payer: Self-pay | Admitting: Podiatry

## 2020-07-27 ENCOUNTER — Ambulatory Visit (INDEPENDENT_AMBULATORY_CARE_PROVIDER_SITE_OTHER): Payer: Medicare HMO | Admitting: Podiatry

## 2020-07-27 ENCOUNTER — Other Ambulatory Visit: Payer: Self-pay

## 2020-07-27 DIAGNOSIS — L03119 Cellulitis of unspecified part of limb: Secondary | ICD-10-CM | POA: Diagnosis not present

## 2020-07-27 DIAGNOSIS — L02619 Cutaneous abscess of unspecified foot: Secondary | ICD-10-CM | POA: Diagnosis not present

## 2020-07-27 MED ORDER — MUPIROCIN 2 % EX OINT: TOPICAL_OINTMENT | CUTANEOUS | 1 refills | Status: DC

## 2020-07-27 NOTE — Progress Notes (Signed)
Subjective:  Patient ID: Julie Acevedo, female    DOB: March 27, 1951,  MRN: [id number] HPI Chief Complaint  Patient presents with  . Foot Pain    Plantar arch left - area of infection x 1 week, this is the 2nd episode, 1st was back in April/May and was in the hospital, she is currently on an antibiotic Rx'd by PCP    69 y.o. female presents with the above complaint.   ROS: Denies fever chills nausea vomiting muscle aches pains calf pain back pain chest pain shortness of breath.  Past Medical History:  Diagnosis Date  . Anemia   . Arthritis   . COVID-19 11/19/2019  . Diverticulitis   . DM (diabetes mellitus) ([place])    typ e 2  . History of methicillin resistant staphylococcus aureus (MRSA) 2017  . HTN (hypertension)   . Hyperlipidemia   . Memory difficulties 09/01/2015  . Multiple thyroid nodules   . Myocardial infarction ([place]) 1995  . Short-term memory loss    Past Surgical History:  Procedure Laterality Date  . ABDOMINAL HYSTERECTOMY     due to endometriosis-1 ovary left  . BREAST EXCISIONAL BIOPSY Right 1971   neg  . CARDIAC CATHETERIZATION  2000   no significant CAD per note of Dr Caryl Comes in 2013   . CHOLECYSTECTOMY    . CYSTOCELE REPAIR N/A 03/19/2017   Procedure: ANTERIOR REPAIR (CYSTOCELE);  Surgeon: Bjorn Loser, MD;  Location: [facility];  Service: Urology;  Laterality: N/A;  . CYSTOSCOPY N/A 03/19/2017   Procedure: CYSTOSCOPY;  Surgeon: Bjorn Loser, MD;  Location: [facility];  Service: Urology;  Laterality: N/A;  . EYE SURGERY    . FOOT SURGERY    . HAND SURGERY    . HEMICOLECTOMY    . INSERTION OF MESH N/A 08/21/2016   Procedure: INSERTION OF MESH;  Surgeon: Excell Seltzer, MD;  Location: [facility];  Service: General;  Laterality: N/A;  . INSERTION OF MESH N/A 12/08/2019   Procedure: INSERTION OF MESH;  Surgeon: Jules Husbands, MD;  Location: [facility];  Service: General;  Laterality: N/A;  . LAPAROSCOPIC LYSIS OF ADHESIONS N/A 08/21/2016   Procedure: LAPAROSCOPIC LYSIS  OF ADHESIONS;  Surgeon: Excell Seltzer, MD;  Location: [facility];  Service: General;  Laterality: N/A;  . LAPAROSCOPIC REMOVAL ABDOMINAL MASS    . LAPAROTOMY N/A 04/16/2019   Procedure: EXPLORATORY LAPAROTOMY;  Surgeon: Benjamine Sprague, DO;  Location: [facility];  Service: General;  Laterality: N/A;  . LOOP RECORDER INSERTION N/A 03/21/2018   Procedure: LOOP RECORDER INSERTION;  Surgeon: Sanda Klein, MD;  Location: [facility];  Service: Cardiovascular;  Laterality: N/A;  . LUMBAR [place]    . ROUX-EN-Y GASTRIC BYPASS  2010   Dr Hassell Done  . TONSILLECTOMY    . UPPER GI ENDOSCOPY  01/12/16   normal larynx, normal esophagus. gastric bypass with a normal-sized pouch and intact staple line, normal examined jejunum, otherwise exam was normal  . VENTRAL HERNIA REPAIR N/A 08/21/2016   Procedure: LAPAROSCOPIC VENTRAL HERNIA;  Surgeon: Excell Seltzer, MD;  Location: [facility];  Service: General;  Laterality: N/A;  . XI ROBOTIC ASSISTED VENTRAL HERNIA N/A 12/08/2019   Procedure: XI ROBOTIC ASSISTED VENTRAL HERNIA;  Surgeon: Jules Husbands, MD;  Location: [facility];  Service: General;  Laterality: N/A;    Current Outpatient Medications:  .  amoxicillin (AMOXIL) 500 MG capsule, Take 1 capsule (500 mg total) by mouth 3 (three) times daily., Disp: 21 capsule, Rfl: 0 .  cyclobenzaprine (FLEXERIL) 5 MG tablet, , Disp: , Rfl:  .  gabapentin (NEURONTIN) 100 MG capsule, Take 100 mg by mouth 2 (two) times daily., Disp: , Rfl:  .  hydrochlorothiazide (HYDRODIURIL) 12.5 MG tablet, Take 1 tablet (12.5 mg total) by mouth daily., Disp: 90 tablet, Rfl: 3 .  LINZESS 290 MCG CAPS capsule, TAKE 1 CAPSULE (290 MCG TOTAL) BY MOUTH DAILY BEFORE BREAKFAST., Disp: 90 capsule, Rfl: 3 .  metFORMIN (GLUCOPHAGE) 500 MG tablet, TAKE 1 TABLET (500 MG TOTAL) BY MOUTH 2 (TWO) TIMES DAILY WITH A MEAL., Disp: 180 tablet, Rfl: 3 .  methocarbamol (ROBAXIN) 500 MG tablet, Take 500 mg by mouth 2 (two) times daily. Back pain, Disp: , Rfl:   .  metoprolol succinate (TOPROL-XL) 25 MG 24 hr tablet, Take 25 mg by mouth daily., Disp: , Rfl:  .  mupirocin ointment (BACTROBAN) 2 %, Apply to wound after soaking BID, Disp: 30 g, Rfl: 1 .  omeprazole (PRILOSEC) 20 MG capsule, TAKE 1 CAPSULE BY MOUTH EVERY DAY, Disp: 90 capsule, Rfl: 1 .  ondansetron (ZOFRAN) 4 MG tablet, Take 1 tablet (4 mg total) by mouth every 8 (eight) hours as needed., Disp: 20 tablet, Rfl: 0 .  traMADol (ULTRAM) 50 MG tablet, Take 1 tablet (50 mg total) by mouth every 6 (six) hours as needed., Disp: 100 tablet, Rfl: 3 .  traZODone (DESYREL) 50 MG tablet, TAKE 1 TABLET (50 MG TOTAL) BY MOUTH AT BEDTIME AS NEEDED FOR SLEEP., Disp: 90 tablet, Rfl: 3 .  Vitamin D, Ergocalciferol, (DRISDOL) 1.25 MG (50000 UNIT) CAPS capsule, TAKE 1 CAPSULE (50,000 UNITS TOTAL) BY MOUTH EVERY 7 (SEVEN) DAYS., Disp: 12 capsule, Rfl: 1  Allergies  Allergen Reactions  . Lotrel [Amlodipine Besy-Benazepril Hcl] Anaphylaxis  . Contrast Media [Iodinated Diagnostic Agents] Swelling and Other (See Comments)    Reaction:  Neck swelling   . Niacin Hives  . Sulfa Antibiotics Hives   Review of Systems Objective:  There were no vitals filed for this visit.  General: Well developed, nourished, in no acute distress, alert and oriented x3   Dermatological: Skin is warm, dry and supple bilateral. Nails x 10 are well maintained; remaining integument appears unremarkable at this time. There are no open sores, no preulcerative lesions, no rash or signs of infection present.  2 pustules and one dried pustule in the medial longitudinal arch that is exquisitely painful left foot.  There is no cellulitis.  Vascular: Dorsalis Pedis artery and Posterior Tibial artery pedal pulses are 1/4 bilateral with immedate capillary fill time. Pedal hair growth present. No varicosities and no lower extremity edema present bilateral.   Neruologic: Grossly intact via light touch bilateral. Vibratory intact via tuning fork  bilateral. Protective threshold with Semmes Wienstein monofilament intact to all pedal sites bilateral. Patellar and Achilles deep tendon reflexes 2+ bilateral. No Babinski or clonus noted bilateral.   Musculoskeletal: No gross boney pedal deformities bilateral. No pain, crepitus, or limitation noted with foot and ankle range of motion bilateral. Muscular strength 5/5 in all groups tested bilateral.  Gait: Unassisted, Nonantalgic.    Radiographs:  None taken  Assessment & Plan:   Assessment: Painful soft tissue lesions plantar medial longitudinal arch cannot rule out tinea pedis with cellulitis but also cannot rule out some type of diabetic bullae development.  Plan: Discussed etiology pathology conservative surgical therapies at this point lanced the pustules for culture and sensitivity.  Place dressing discussed soaking in Epson salt warm water also wrote a prescription for  Bactroban ointment for her to apply.  She is currently taken amoxicillin and I will follow-up with her soon.     Analy Bassford T. [place], [place]

## 2020-08-02 LAB — WOUND CULTURE

## 2020-08-03 ENCOUNTER — Telehealth: Payer: Self-pay | Admitting: *Deleted

## 2020-08-03 NOTE — Chronic Care Management (AMB) (Signed)
  Chronic Care Management   Note  08/03/2020 Name: Julie Acevedo MRN: [id number] DOB: 1951-12-10  Julie Acevedo is a 69 y.o. year old female who is a primary care patient of Jerrol Banana., MD. I reached out to [name] by phone today in response to a referral sent by Julie Acevedo's health plan.     Julie Acevedo was given information about Chronic Care Management services today including:  1. CCM service includes personalized support from designated clinical staff supervised by her physician, including individualized plan of care and coordination with other care providers 2. 24/7 contact phone numbers for assistance for urgent and routine care needs. 3. Service will only be billed when office clinical staff spend 20 minutes or more in a month to coordinate care. 4. Only one practitioner may furnish and bill the service in a calendar month. 5. The patient may stop CCM services at any time (effective at the end of the month) by phone call to the office staff. 6. The patient will be responsible for cost sharing (co-pay) of up to 20% of the service fee (after annual deductible is met).  Patient agreed to services and verbal consent obtained.   Follow up plan: Telephone appointment with care management team member scheduled for: 08/19/2020  [place], [place], [place] [address] Direct Dial: [email or web address]

## 2020-08-05 ENCOUNTER — Other Ambulatory Visit: Payer: Self-pay | Admitting: Family Medicine

## 2020-08-05 DIAGNOSIS — M545 Low back pain, unspecified: Secondary | ICD-10-CM

## 2020-08-05 NOTE — Telephone Encounter (Signed)
Requested medication (s) are due for refill today:  Yes  Requested medication (s) are on the active medication list:  Yes  Future visit scheduled:  Yes  Last Refill: Historical provider  Requested Prescriptions  Pending Prescriptions Disp Refills   cyclobenzaprine (FLEXERIL) 5 MG tablet [Pharmacy Med Name: CYCLOBENZAPRINE 5 MG TABLET] 30 tablet 2    Sig: TAKE 1 TABLET BY MOUTH THREE TIMES A DAY AS NEEDED FOR MUSCLE SPASMS      Not Delegated - Analgesics:  Muscle Relaxants Failed - 08/05/2020  6:18 PM      Failed - This refill cannot be delegated      Passed - Valid encounter within last 6 months    Recent Outpatient Visits           1 week ago Diabetic infection of left foot [facility])   [facility] Jerrol Banana., MD   4 weeks ago Low back pain radiating to right lower extremity   [facility] Jerrol Banana., MD   1 month ago Low back pain radiating to right lower extremity   [facility] Jerrol Banana., MD   2 months ago Gastroesophageal reflux disease, unspecified whether esophagitis present   [facility] Jerrol Banana., MD   3 months ago Neck pain   [facility] Jerrol Banana., MD       Future Appointments             In 1 week Jerrol Banana., MD [facility], [place]   In 5 months Jerrol Banana., MD [facility], [place]

## 2020-08-15 ENCOUNTER — Encounter: Payer: Self-pay | Admitting: Podiatry

## 2020-08-15 ENCOUNTER — Ambulatory Visit (INDEPENDENT_AMBULATORY_CARE_PROVIDER_SITE_OTHER): Payer: Medicare HMO | Admitting: Podiatry

## 2020-08-15 ENCOUNTER — Other Ambulatory Visit: Payer: Self-pay

## 2020-08-15 DIAGNOSIS — M79676 Pain in unspecified toe(s): Secondary | ICD-10-CM | POA: Diagnosis not present

## 2020-08-15 DIAGNOSIS — L03119 Cellulitis of unspecified part of limb: Secondary | ICD-10-CM | POA: Diagnosis not present

## 2020-08-15 DIAGNOSIS — B351 Tinea unguium: Secondary | ICD-10-CM

## 2020-08-15 DIAGNOSIS — L02619 Cutaneous abscess of unspecified foot: Secondary | ICD-10-CM

## 2020-08-15 NOTE — Progress Notes (Signed)
She presents today for follow-up of her left foot states that is doing much better as she refers to the sore that was in the medial longitudinal arch.  She is complaining of painful elongated toenails today 1 through 5 bilaterally.  Objective: Vital signs are stable she alert oriented x3 wound is on to heal up completely on the plantar aspect of the left foot with only some hyperpigmentation left.  Toenails are long thick yellow dystrophic-like mycotic and pulses are strongly palpable.  No open lesions or wounds noted.  Pathology result from purulence stated that it was normal skin flora.  Assessment: Well-healing wound plantar aspect left foot.  Pain in limb secondary to onychomycosis.  Diabetes and peripheral neuropathy.  Plan: Debrided toenails 1 through 5 bilaterally.  She will follow up with [place] as needed.

## 2020-08-16 NOTE — Progress Notes (Signed)
Trena Platt Cummings,acting as a scribe for Julie Durie, MD.,have documented all relevant documentation on the behalf of Julie Durie, MD,as directed by  Julie Durie, MD while in the presence of Julie Durie, MD.  Complete physical exam   Patient: Julie Acevedo   DOB: [birth year]   69 y.o. Female  MRN: [id number] Visit Date: 08/18/2020  Today's healthcare provider: [name], MD   Chief Complaint  Patient presents with  . Follow-up   Subjective    Julie Acevedo is a 69 y.o. female who presents today for a complete physical exam.  She reports consuming a general diet. The patient does not participate in regular exercise at present. She generally feels fairly well. She reports sleeping poorly. She does have additional problems to discuss today.  She has acute on chronic upper lumbar and lower thoracic back pain.  She also has some abdominal pain, especially epigastric that is not related to eating.  This is been an intermittent problem. HPI   Patient had AWV with [facility] on 02/08/2020.  Patient would like to discuss joint pain.   Past Medical History:  Diagnosis Date  . Anemia   . Arthritis   . COVID-19 11/19/2019  . Diverticulitis   . DM (diabetes mellitus) ([place])    typ e 2  . History of methicillin resistant staphylococcus aureus (MRSA) 2017  . HTN (hypertension)   . Hyperlipidemia   . Memory difficulties 09/01/2015  . Multiple thyroid nodules   . Myocardial infarction ([place]) 1995  . Short-term memory loss    Past Surgical History:  Procedure Laterality Date  . ABDOMINAL HYSTERECTOMY     due to endometriosis-1 ovary left  . BREAST EXCISIONAL BIOPSY Right 1971   neg  . CARDIAC CATHETERIZATION  2000   no significant CAD per note of Dr Caryl Comes in 2013   . CHOLECYSTECTOMY    . CYSTOCELE REPAIR N/A 03/19/2017   Procedure: ANTERIOR REPAIR (CYSTOCELE);  Surgeon: Bjorn Loser, MD;  Location: [facility];  Service: Urology;  Laterality: N/A;  .  CYSTOSCOPY N/A 03/19/2017   Procedure: CYSTOSCOPY;  Surgeon: Bjorn Loser, MD;  Location: [facility];  Service: Urology;  Laterality: N/A;  . EYE SURGERY    . FOOT SURGERY    . HAND SURGERY    . HEMICOLECTOMY    . INSERTION OF MESH N/A 08/21/2016   Procedure: INSERTION OF MESH;  Surgeon: Excell Seltzer, MD;  Location: [facility];  Service: General;  Laterality: N/A;  . INSERTION OF MESH N/A 12/08/2019   Procedure: INSERTION OF MESH;  Surgeon: Jules Husbands, MD;  Location: [facility];  Service: General;  Laterality: N/A;  . LAPAROSCOPIC LYSIS OF ADHESIONS N/A 08/21/2016   Procedure: LAPAROSCOPIC LYSIS OF ADHESIONS;  Surgeon: Excell Seltzer, MD;  Location: [facility];  Service: General;  Laterality: N/A;  . LAPAROSCOPIC REMOVAL ABDOMINAL MASS    . LAPAROTOMY N/A 04/16/2019   Procedure: EXPLORATORY LAPAROTOMY;  Surgeon: Benjamine Sprague, DO;  Location: [facility];  Service: General;  Laterality: N/A;  . LOOP RECORDER INSERTION N/A 03/21/2018   Procedure: LOOP RECORDER INSERTION;  Surgeon: Sanda Klein, MD;  Location: [facility];  Service: Cardiovascular;  Laterality: N/A;  . LUMBAR [place]    . ROUX-EN-Y GASTRIC BYPASS  2010   Dr Hassell Done  . TONSILLECTOMY    . UPPER GI ENDOSCOPY  01/12/16   normal larynx, normal esophagus. gastric bypass with a normal-sized pouch and intact staple line, normal  examined jejunum, otherwise exam was normal  . VENTRAL HERNIA REPAIR N/A 08/21/2016   Procedure: LAPAROSCOPIC VENTRAL HERNIA;  Surgeon: Excell Seltzer, MD;  Location: [facility];  Service: General;  Laterality: N/A;  . XI ROBOTIC ASSISTED VENTRAL HERNIA N/A 12/08/2019   Procedure: XI ROBOTIC ASSISTED VENTRAL HERNIA;  Surgeon: Jules Husbands, MD;  Location: [facility];  Service: General;  Laterality: N/A;   Social History   Socioeconomic History  . Marital status: Married    Spouse name: Not on file  . Number of children: 3  . Years of education: 52  . Highest education level: Some college, no degree   Occupational History  . Occupation: IT consultant: RETIRED  Tobacco Use  . Smoking status: Never Smoker  . Smokeless tobacco: Never Used  Vaping Use  . Vaping Use: Never used  Substance and Sexual Activity  . Alcohol use: No  . Drug use: No  . Sexual activity: Never  Other Topics Concern  . Not on file  Social History Narrative   Lives with husband, daughter and granddaughter.;   Patient drinks about 3 cups of caffeine daily.   Patient is left handed.    Social Determinants of Health   Financial Resource Strain: Low Risk   . Difficulty of Paying Living Expenses: Not hard at all  Food Insecurity: No Food Insecurity  . Worried About Charity fundraiser in the Last Year: Never true  . Ran Out of Food in the Last Year: Never true  Transportation Needs: No Transportation Needs  . Lack of Transportation (Medical): No  . Lack of Transportation (Non-Medical): No  Physical Activity: Inactive  . Days of Exercise per Week: 0 days  . Minutes of Exercise per Session: 0 min  Stress: No Stress Concern Present  . Feeling of Stress : Only a little  Social Connections: Moderately Integrated  . Frequency of Communication with Friends and Family: More than three times a week  . Frequency of Social Gatherings with Friends and Family: More than three times a week  . Attends Religious Services: More than 4 times per year  . Active Member of Clubs or Organizations: No  . Attends Archivist Meetings: Never  . Marital Status: Married  Human resources officer Violence: Not At Risk  . Fear of Current or Ex-Partner: No  . Emotionally Abused: No  . Physically Abused: No  . Sexually Abused: No   Family Status  Relation Name Status  . Father  Deceased at age 59  . Mother  Deceased at age 26  . PGM  Deceased  . MGM  Deceased  . MGF  Deceased at age 65  . PGF  Deceased       in his 61s MI  . Ethlyn Daniels  (Not Specified)  . Ethlyn Daniels  (Not Specified)   Family History  Problem  Relation Age of Onset  . Cancer Father 28       Lung  . Coronary artery disease Father 11  . COPD Mother   . Diabetes Mother   . Hypertension Mother   . Cancer Paternal Grandmother        Breast  . Breast cancer Paternal Grandmother   . Congestive Heart Failure Maternal Grandmother   . Emphysema Maternal Grandfather   . Heart attack Paternal Grandfather   . Cancer Paternal Aunt        Breast  . Breast cancer Paternal Aunt 53  . Breast cancer Paternal  Aunt 30   Allergies  Allergen Reactions  . Lotrel [Amlodipine Besy-Benazepril Hcl] Anaphylaxis  . Contrast Media [Iodinated Diagnostic Agents] Swelling and Other (See Comments)    Reaction:  Neck swelling   . Niacin Hives  . Sulfa Antibiotics Hives    Patient Care Team: Jerrol Banana., MD as PCP - General (Family Medicine) Yolonda Kida, MD as PCP - Cardiology (Cardiology) Lorelee Cover., MD as Consulting Physician (Ophthalmology) Bjorn Loser, MD as Consulting Physician (Urology) Bary Castilla, Forest Gleason, MD as Consulting Physician (General Surgery) Jules Husbands, MD as Consulting Physician (General Surgery) Verdia Kuba, [facility] (Pharmacist)   Medications: Outpatient Medications Prior to Visit  Medication Sig  . amoxicillin (AMOXIL) 500 MG capsule Take 1 capsule (500 mg total) by mouth 3 (three) times daily.  . cyclobenzaprine (FLEXERIL) 5 MG tablet TAKE 1 TABLET BY MOUTH THREE TIMES A DAY AS NEEDED FOR MUSCLE SPASMS  . gabapentin (NEURONTIN) 100 MG capsule Take 100 mg by mouth 2 (two) times daily.  . hydrochlorothiazide (HYDRODIURIL) 12.5 MG tablet Take 1 tablet (12.5 mg total) by mouth daily.  Marland Kitchen LINZESS 290 MCG CAPS capsule TAKE 1 CAPSULE (290 MCG TOTAL) BY MOUTH DAILY BEFORE BREAKFAST.  . metFORMIN (GLUCOPHAGE) 500 MG tablet TAKE 1 TABLET (500 MG TOTAL) BY MOUTH 2 (TWO) TIMES DAILY WITH A MEAL.  . methocarbamol (ROBAXIN) 500 MG tablet Take 500 mg by mouth 2 (two) times daily. Back pain  . metoprolol  succinate (TOPROL-XL) 25 MG 24 hr tablet Take 25 mg by mouth daily.  . mupirocin ointment (BACTROBAN) 2 % Apply to wound after soaking BID  . omeprazole (PRILOSEC) 20 MG capsule TAKE 1 CAPSULE BY MOUTH EVERY DAY  . ondansetron (ZOFRAN) 4 MG tablet Take 1 tablet (4 mg total) by mouth every 8 (eight) hours as needed.  . traMADol (ULTRAM) 50 MG tablet Take 1 tablet (50 mg total) by mouth every 6 (six) hours as needed.  . traZODone (DESYREL) 50 MG tablet TAKE 1 TABLET (50 MG TOTAL) BY MOUTH AT BEDTIME AS NEEDED.  . Vitamin D, Ergocalciferol, (DRISDOL) 1.25 MG (50000 UNIT) CAPS capsule TAKE 1 CAPSULE (50,000 UNITS TOTAL) BY MOUTH EVERY 7 (SEVEN) DAYS.   No facility-administered medications prior to visit.    Review of Systems  Constitutional: Positive for fatigue.  HENT: Positive for voice change.   Eyes: Positive for itching.  Respiratory: Negative.   Cardiovascular: Positive for leg swelling.  Gastrointestinal: Negative.   Endocrine: Negative.   Genitourinary: Positive for decreased urine volume and difficulty urinating.  Musculoskeletal: Positive for arthralgias, back pain and joint swelling.  Skin: Negative.   Allergic/Immunologic: Negative.   Neurological: Negative.   Hematological: Bruises/bleeds easily.  Psychiatric/Behavioral: Positive for confusion and decreased concentration.    Last hemoglobin A1c Lab Results  Component Value Date   HGBA1C 5.6 07/07/2020      Objective    BP 111/72 (BP Location: Left Arm, Patient Position: Sitting, Cuff Size: Large)   Pulse 64   Temp 98.7 F (37.1 C) (Oral)   Ht _0  (1.6 m)   Wt 135 lb 3.2 oz (61.3 kg)   BMI 23.95 kg/m  BP Readings from Last 3 Encounters:  08/18/20 111/72  07/25/20 98/66  07/07/20 107/71   Wt Readings from Last 3 Encounters:  08/18/20 135 lb 3.2 oz (61.3 kg)  07/25/20 133 lb (60.3 kg)  07/07/20 131 lb 6.4 oz (59.6 kg)      Physical Exam Vitals reviewed.  Constitutional:  Appearance: She is  well-developed.  HENT:     Head: Normocephalic and atraumatic.     Right Ear: External ear normal.     Left Ear: External ear normal.     Nose: Nose normal.  Eyes:     General: No scleral icterus.    Conjunctiva/sclera: Conjunctivae normal.     Pupils: Pupils are equal, round, and reactive to light.  Neck:     Thyroid: No thyromegaly.  Cardiovascular:     Rate and Rhythm: Normal rate and regular rhythm.     Heart sounds: Normal heart sounds.  Pulmonary:     Effort: Pulmonary effort is normal.     Breath sounds: Normal breath sounds.  Abdominal:     Palpations: Abdomen is soft.     Tenderness: There is abdominal tenderness.     Comments: Minimal diffuse tenderness, more so in the epigastric area.  No guarding or rebound or mass-effect.  Genitourinary:    Comments: DRE declined by pt. Lymphadenopathy:     Cervical: No cervical adenopathy.  Skin:    General: Skin is warm and dry.  Neurological:     General: No focal deficit present.     Mental Status: She is alert and oriented to person, place, and time.  Psychiatric:        Mood and Affect: Mood normal.        Behavior: Behavior normal.        Thought Content: Thought content normal.        Judgment: Judgment normal.       Last depression screening scores PHQ 2/9 Scores 02/08/2020 02/05/2019 02/04/2018  PHQ - 2 Score 0 0 1  PHQ- 9 Score - - -   Last fall risk screening Fall Risk  04/18/2020  Falls in the past year? 0  Number falls in past yr: -  Injury with Fall? -  Risk for fall due to : -  Follow up -   Last Audit-C alcohol use screening Alcohol Use Disorder Test (AUDIT) 02/08/2020  1. How often do you have a drink containing alcohol? 0  2. How many drinks containing alcohol do you have on a typical day when you are drinking? 0  3. How often do you have six or more drinks on one occasion? 0  AUDIT-C Score 0   A score of 3 or more in women, and 4 or more in men indicates increased risk for alcohol abuse, EXCEPT if  all of the points are from question 1   No results found for any visits on 08/18/20.  Assessment & Plan    Routine Health Maintenance and Physical Exam  Exercise Activities and Dietary recommendations Goals    . LIFESTYLE - DECREASE FALLS RISK     Recommend to remove any items from the home that may cause slips or trips.       Immunization History  Administered Date(s) Administered  . Fluad Quad(high Dose 65+) 08/15/2019  . Influenza Split 10/17/2010, 10/02/2011, 08/27/2012  . Influenza, High Dose Seasonal PF 08/29/2018  . Influenza,inj,Quad PF,6+ Mos 09/26/2013, 09/06/2014, 09/06/2016  . Influenza,inj,Quad PF,6-35 Mos 08/31/2017  . Influenza-Unspecified 09/15/2015  . Pneumococcal Conjugate-13 08/29/2018  . Pneumococcal Polysaccharide-23 11/17/2012  . Tdap 10/02/2011, 09/27/2016  . Zoster 09/06/2016  . Zoster Recombinat (Shingrix) 08/29/2018, 08/15/2019    Health Maintenance  Topic Date Due  . COVID-19 Vaccine (1) Never done  . FOOT EXAM  11/18/2018  . PNA vac Low Risk Adult (2 of 2 -  PPSV23) 08/30/2019  . INFLUENZA VACCINE  07/17/2020  . OPHTHALMOLOGY EXAM  12/02/2020  . HEMOGLOBIN A1C  01/07/2021  . URINE MICROALBUMIN  06/23/2021  . MAMMOGRAM  11/09/2021  . TETANUS/TDAP  09/27/2026  . COLONOSCOPY  09/05/2028  . DEXA SCAN  03/10/2030  . Hepatitis C Screening  Completed    Discussed health benefits of physical activity, and encouraged her to engage in regular exercise appropriate for her age and condition.  1. Annual physical exam   2. Decreased urination  - POCT urinalysis dipstick  3. Urinary hesitancy  - Urine Culture - Lipase - CBC with Differential/Platelet - Comprehensive metabolic panel - TSH - Lipid panel - Sed Rate (ESR)  4. B12 deficiency  - Lipase - CBC with Differential/Platelet - Comprehensive metabolic panel - TSH - Lipid panel - Sed Rate (ESR)  5. Type 2 diabetes mellitus with stage 2 chronic kidney disease, without long-term  current use of insulin (HCC)  - Lipase - CBC with Differential/Platelet - Comprehensive metabolic panel - TSH - Lipid panel - Sed Rate (ESR)  6. Benign essential HTN  - Lipase - CBC with Differential/Platelet - Comprehensive metabolic panel - TSH - Lipid panel - Sed Rate (ESR)  7. Mixed hyperlipidemia  - Lipase - CBC with Differential/Platelet - Comprehensive metabolic panel - TSH - Lipid panel - Sed Rate (ESR)  8. Anemia, unspecified type  - Lipase - CBC with Differential/Platelet - Comprehensive metabolic panel - TSH - Lipid panel - Sed Rate (ESR)  9. Generalized abdominal pain Obtain ultrasound of this patient who has had multiple abdominal surgeries - US Abdomen Complete  10. Low back pain radiating to right lower extremity Give Toradol today.  May need further imaging but she has had back surgeries also in the past. - ketorolac (TORADOL) injection 60 mg   No follow-ups on file.        Enrico Eaddy Cranford Mon, MD  [facility] [phone] (phone) [phone] (fax)  [place]

## 2020-08-17 ENCOUNTER — Other Ambulatory Visit: Payer: Self-pay | Admitting: Family Medicine

## 2020-08-17 NOTE — Telephone Encounter (Signed)
Requested  medications are  due for refill today yes  Requested medications are on the active medication list yes  Last refill 8/4  Last visit   Future visit scheduled JAN 2022  Notes to clinic Do not see a visit within 6 months addressing med/dx therefore fails protocol of valid visit within 6 months.

## 2020-08-18 ENCOUNTER — Encounter: Payer: Self-pay | Admitting: Family Medicine

## 2020-08-18 ENCOUNTER — Other Ambulatory Visit: Payer: Self-pay

## 2020-08-18 ENCOUNTER — Ambulatory Visit (INDEPENDENT_AMBULATORY_CARE_PROVIDER_SITE_OTHER): Payer: Medicare HMO | Admitting: Family Medicine

## 2020-08-18 VITALS — BP 111/72 | HR 64 | Temp 98.7°F | Ht 63.0 in | Wt 135.2 lb

## 2020-08-18 DIAGNOSIS — E782 Mixed hyperlipidemia: Secondary | ICD-10-CM | POA: Diagnosis not present

## 2020-08-18 DIAGNOSIS — M545 Low back pain, unspecified: Secondary | ICD-10-CM

## 2020-08-18 DIAGNOSIS — Z0000 Encounter for general adult medical examination without abnormal findings: Secondary | ICD-10-CM | POA: Diagnosis not present

## 2020-08-18 DIAGNOSIS — D649 Anemia, unspecified: Secondary | ICD-10-CM

## 2020-08-18 DIAGNOSIS — R34 Anuria and oliguria: Secondary | ICD-10-CM | POA: Diagnosis not present

## 2020-08-18 DIAGNOSIS — R1084 Generalized abdominal pain: Secondary | ICD-10-CM

## 2020-08-18 DIAGNOSIS — R3911 Hesitancy of micturition: Secondary | ICD-10-CM

## 2020-08-18 DIAGNOSIS — E1122 Type 2 diabetes mellitus with diabetic chronic kidney disease: Secondary | ICD-10-CM | POA: Diagnosis not present

## 2020-08-18 DIAGNOSIS — E538 Deficiency of other specified B group vitamins: Secondary | ICD-10-CM | POA: Diagnosis not present

## 2020-08-18 DIAGNOSIS — I10 Essential (primary) hypertension: Secondary | ICD-10-CM | POA: Diagnosis not present

## 2020-08-18 DIAGNOSIS — M79604 Pain in right leg: Secondary | ICD-10-CM

## 2020-08-18 DIAGNOSIS — N182 Chronic kidney disease, stage 2 (mild): Secondary | ICD-10-CM

## 2020-08-18 LAB — POCT URINALYSIS DIPSTICK
Glucose, UA: NEGATIVE
Ketones, UA: NEGATIVE
Nitrite, UA: NEGATIVE
Protein, UA: POSITIVE — AB
Spec Grav, UA: 1.015 (ref 1.010–1.025)
Urobilinogen, UA: 0.2 E.U./dL
pH, UA: 6 (ref 5.0–8.0)

## 2020-08-18 MED ORDER — KETOROLAC TROMETHAMINE 60 MG/2ML IM SOLN
60.0000 mg | Freq: Once | INTRAMUSCULAR | Status: AC
  Administered 2020-08-18: 60 mg via INTRAMUSCULAR

## 2020-08-19 ENCOUNTER — Other Ambulatory Visit: Payer: Self-pay

## 2020-08-19 ENCOUNTER — Ambulatory Visit: Payer: Medicare HMO | Admitting: Pharmacist

## 2020-08-19 DIAGNOSIS — E782 Mixed hyperlipidemia: Secondary | ICD-10-CM

## 2020-08-19 DIAGNOSIS — E1122 Type 2 diabetes mellitus with diabetic chronic kidney disease: Secondary | ICD-10-CM

## 2020-08-19 DIAGNOSIS — N1832 Chronic kidney disease, stage 3b: Secondary | ICD-10-CM

## 2020-08-19 LAB — CBC WITH DIFFERENTIAL/PLATELET
Basophils Absolute: 0 10*3/uL (ref 0.0–0.2)
Basos: 1 %
EOS (ABSOLUTE): 0.2 10*3/uL (ref 0.0–0.4)
Eos: 2 %
Hematocrit: 36.2 % (ref 34.0–46.6)
Hemoglobin: 11.8 g/dL (ref 11.1–15.9)
Immature Grans (Abs): 0 10*3/uL (ref 0.0–0.1)
Immature Granulocytes: 0 %
Lymphocytes Absolute: 1.7 10*3/uL (ref 0.7–3.1)
Lymphs: 28 %
MCH: 29.3 pg (ref 26.6–33.0)
MCHC: 32.6 g/dL (ref 31.5–35.7)
MCV: 90 fL (ref 79–97)
Monocytes Absolute: 0.4 10*3/uL (ref 0.1–0.9)
Monocytes: 7 %
Neutrophils Absolute: 3.9 10*3/uL (ref 1.4–7.0)
Neutrophils: 62 %
Platelets: 286 10*3/uL (ref 150–450)
RBC: 4.03 x10E6/uL (ref 3.77–5.28)
RDW: 12.6 % (ref 11.7–15.4)
WBC: 6.2 10*3/uL (ref 3.4–10.8)

## 2020-08-19 LAB — COMPREHENSIVE METABOLIC PANEL
ALT: 9 IU/L (ref 0–32)
AST: 19 IU/L (ref 0–40)
Albumin/Globulin Ratio: 1.5 (ref 1.2–2.2)
Albumin: 4.1 g/dL (ref 3.8–4.8)
Alkaline Phosphatase: 191 IU/L — ABNORMAL HIGH (ref 48–121)
BUN/Creatinine Ratio: 13 (ref 12–28)
BUN: 19 mg/dL (ref 8–27)
Bilirubin Total: 0.3 mg/dL (ref 0.0–1.2)
CO2: 24 mmol/L (ref 20–29)
Calcium: 9.1 mg/dL (ref 8.7–10.3)
Chloride: 102 mmol/L (ref 96–106)
Creatinine, Ser: 1.52 mg/dL — ABNORMAL HIGH (ref 0.57–1.00)
GFR calc Af Amer: 41 mL/min/{1.73_m2} — ABNORMAL LOW (ref >59)
GFR calc non Af Amer: 35 mL/min/{1.73_m2} — ABNORMAL LOW (ref >59)
Globulin, Total: 2.7 g/dL (ref 1.5–4.5)
Glucose: 90 mg/dL (ref 65–99)
Potassium: 3.7 mmol/L (ref 3.5–5.2)
Sodium: 143 mmol/L (ref 134–144)
Total Protein: 6.8 g/dL (ref 6.0–8.5)

## 2020-08-19 LAB — LIPID PANEL
Chol/HDL Ratio: 2.9 ratio (ref 0.0–4.4)
Cholesterol, Total: 180 mg/dL (ref 100–199)
HDL: 62 mg/dL (ref >39)
LDL Chol Calc (NIH): 100 mg/dL — ABNORMAL HIGH (ref 0–99)
Triglycerides: 100 mg/dL (ref 0–149)
VLDL Cholesterol Cal: 18 mg/dL (ref 5–40)

## 2020-08-19 LAB — SEDIMENTATION RATE: Sed Rate: 33 mm/hr (ref 0–40)

## 2020-08-19 LAB — LIPASE: Lipase: 27 U/L (ref 14–72)

## 2020-08-19 LAB — TSH: TSH: 2.17 u[IU]/mL (ref 0.450–4.500)

## 2020-08-19 NOTE — Chronic Care Management (AMB) (Signed)
Chronic Care Management Pharmacy  Name: Julie Acevedo  MRN: [id number] DOB: May 04, 1951  Chief Complaint/ HPI  Julie Acevedo,  69 y.o. , female presents for their Initial CCM visit with the clinical pharmacist via telephone due to COVID-19 Pandemic.  PCP : Jerrol Banana., MD  Their chronic conditions include: HTN, HLD, DM, Chronic pain  Office Visits: 9/2 abdominal pain, Rosanna Randy, BP 111/72 P 64 Wt 135 BMI 24.0, poor sleep 8/9L foot infection, Gilbert, BP 98/66 P 69 Wt 133 BMI 23.6, hx gastric bypass, amox 532m tid x 7d 7/22 back pain, Gilbert, BP 107/71 P 76, Wt 131 BMI 23.3, pred taper, Flexeril 565mtid prn, pain free, d/c metformin  Consult Visit: NA  Medications: Outpatient Encounter Medications as of 08/19/2020  Medication Sig  . cyclobenzaprine (FLEXERIL) 5 MG tablet TAKE 1 TABLET BY MOUTH THREE TIMES A DAY AS NEEDED FOR MUSCLE SPASMS (Patient taking differently: 5 mg in the morning and at bedtime. )  . gabapentin (NEURONTIN) 100 MG capsule Take 100 mg by mouth in the morning, at noon, and at bedtime.   . hydrochlorothiazide (HYDRODIURIL) 25 MG tablet Take 25 mg by mouth daily.  . Marland KitchenINZESS 290 MCG CAPS capsule TAKE 1 CAPSULE (290 MCG TOTAL) BY MOUTH DAILY BEFORE BREAKFAST.  . metFORMIN (GLUCOPHAGE) 500 MG tablet TAKE 1 TABLET (500 MG TOTAL) BY MOUTH 2 (TWO) TIMES DAILY WITH A MEAL. (Patient taking differently: 500 mg daily. )  . metoprolol succinate (TOPROL-XL) 25 MG 24 hr tablet Take 25 mg by mouth daily.  . Marland Kitchenmeprazole (PRILOSEC) 20 MG capsule TAKE 1 CAPSULE BY MOUTH EVERY DAY  . ondansetron (ZOFRAN) 4 MG tablet Take 1 tablet (4 mg total) by mouth every 8 (eight) hours as needed.  . traMADol (ULTRAM) 50 MG tablet Take 1 tablet (50 mg total) by mouth every 6 (six) hours as needed. (Patient taking differently: Take 50 mg by mouth 2 (two) times daily. )  . traZODone (DESYREL) 50 MG tablet TAKE 1 TABLET (50 MG TOTAL) BY MOUTH AT BEDTIME AS NEEDED. (Patient taking differently:  50 mg at bedtime. )  . Vitamin D, Ergocalciferol, (DRISDOL) 1.25 MG (50000 UNIT) CAPS capsule TAKE 1 CAPSULE (50,000 UNITS TOTAL) BY MOUTH EVERY 7 (SEVEN) DAYS.  . Marland Kitchenmoxicillin (AMOXIL) 500 MG capsule Take 1 capsule (500 mg total) by mouth 3 (three) times daily. (Patient not taking: Reported on 08/19/2020)  . hydrochlorothiazide (HYDRODIURIL) 12.5 MG tablet Take 1 tablet (12.5 mg total) by mouth daily. (Patient not taking: Reported on 08/19/2020)  . methocarbamol (ROBAXIN) 500 MG tablet Take 500 mg by mouth 2 (two) times daily. Back pain (Patient not taking: Reported on 08/19/2020)  . mupirocin ointment (BACTROBAN) 2 % Apply to wound after soaking BID (Patient not taking: Reported on 08/19/2020)  . [DISCONTINUED] diphenhydrAMINE (BENADRYL) 50 MG tablet Patient is to take one tablet 1 hour before test. (Patient not taking: Reported on 04/28/2020)   No facility-administered encounter medications on file as of 08/19/2020.      Financial Resource Strain: Low Risk   . Difficulty of Paying Living Expenses: Not hard at all    Current Diagnosis/Assessment:  Goals Addressed            This Visit's Progress   . Chronic Care Management       CARE PLAN ENTRY (see longitudinal plan of care for additional care plan information)  Current Barriers:  . Chronic Disease Management support, education, and care coordination needs related to Hypertension, Hyperlipidemia, and  Diabetes   Hypertension BP Readings from Last 3 Encounters:  08/18/20 111/72  07/25/20 98/66  07/07/20 107/71   . Pharmacist Clinical Goal(s): o Over the next 90 days, patient will work with PharmD and providers to maintain BP goal <140/90 . Current regimen:  o Hydrochlorothiazide 73m daily o Metoprolol 216mdaily . Interventions: o None . Patient self care activities - Over the next 0- days, patient will: o Check BP weekly, document, and provide at future appointments o Ensure daily salt intake < 2300 mg/day o Report any low blood  pressure, hypotension, dizziness, fainting and falls  Hyperlipidemia Lab Results  Component Value Date/Time   LDLCALC 100 (H) 08/18/2020 11:36 AM   LDLCALC 69 01/18/2014 06:32 AM   . Pharmacist Clinical Goal(s): o Over the next 90 days, patient will work with PharmD and providers to achieve LDL goal < 100 . Current regimen:  o None . Interventions: o None . Patient self care activities - Over the next 90 days, patient will: o Reduce bad cholesterol slightly through diet improvements including less saturated fat  Diabetes Lab Results  Component Value Date/Time   HGBA1C 5.6 07/07/2020 10:01 AM   HGBA1C 5.8 (H) 06/26/2018 09:23 AM   HGBA1C 5.8 11/18/2017 09:33 AM   HGBA1C 5.8 (H) 03/13/2017 11:37 AM   HGBA1C 6.3 01/18/2014 06:32 AM   . Pharmacist Clinical Goal(s): o Over the next 90 days, patient will work with PharmD and providers to maintain A1c goal <7% . Current regimen:  o Metformin 50065mvery morning with breakfast . Interventions: o None . Patient self care activities - Over the next 90 days, patient will: o Check blood sugar once daily, document, and provide at future appointments o Contact provider with any episodes of hypoglycemia  Chronic Pain . Pharmacist Clinical Goal(s) o Over the next 90 days, patient will work with PharmD and providers to reduce pain . Current regimen:  o Vicodin 5/325m74mtab by mouth as needed about weekly o Gabapentin 100mg45mee times daily o Flexeril 5mg t61me times daily as needed o Tramadol 50mg t55m daily . Interventions: o Stop taking Robaxin (methocarbamol) which is a duplicate of Flexeril o Recommended pain management clinic referral . Patient self care activities - Over the next 90 days, patient will: o Follow up on referral to pain management  Medication management . Pharmacist Clinical Goal(s): o Over the next 90 days, patient will work with PharmD and providers to maintain optimal medication adherence . [facility] . Interventions o Comprehensive medication review performed. o Continue current medication management strategy . Patient self care activities - Over the next 90 days, patient will: o Focus on medication adherence by continuing current practices o Take medications as prescribed o Report any questions or concerns to PharmD and/or provider(s)  Initial goal documentation       Hypertension   BP goal is:  <140/90  Office blood pressures are  BP Readings from Last 3 Encounters:  08/18/20 111/72  07/25/20 98/66  07/07/20 107/71   Patient checks BP at home 1-2x per week Patient home BP readings are ranging: low,   Patient has failed these meds in the past: NA Patient is currently controlled on the following medications:  . HCTZ 25mg da42m. Metoprolol 25mg dai73m We discussed  Sometimes has low BP HCTZ 25mg dail54mlan  Continue current medications     Hyperlipidemia   LDL goal < 100  Lipid Panel     Component  Value Date/Time   CHOL 180 08/18/2020 1136   CHOL 154 01/18/2014 0632   TRIG 100 08/18/2020 1136   TRIG 53 01/18/2014 0632   HDL 62 08/18/2020 1136   HDL 74 (H) 01/18/2014 0632   LDLCALC 100 (H) 08/18/2020 1136   LDLCALC 69 01/18/2014 0632    Hepatic Function Latest Ref Rng & Units 08/18/2020 12/17/2019 09/28/2019  Total Protein 6.0 - 8.5 g/dL 6.8 7.9 6.9  Albumin 3.8 - 4.8 g/dL 4.1 3.6 3.9  AST 0 - 40 IU/L 19 14(L) 16  ALT 0 - 32 IU/L 9 7 -  Alk Phosphatase 48 - 121 IU/L 191(H) 115 170(H)  Total Bilirubin 0.0 - 1.2 mg/dL 0.3 0.4 <0.2     The 10-year ASCVD risk score Mikey Bussing DC Jr., et al., 2013) is: 15.4%   Values used to calculate the score:     Age: 36 years     Sex: Female     Is Non-Hispanic African American: Yes     Diabetic: Yes     Tobacco smoker: No     Systolic Blood Pressure: 415 mmHg     Is BP treated: Yes     HDL Cholesterol: 62 mg/dL     Total Cholesterol: 180 mg/dL   Patient has failed these meds in past:  NA Patient is currently uncontrolled on the following medications:  . None  We discussed:   S/p MI 1999  Plan Dietary excursions due to recent life stressors  Continue control with diet and exercise  Diabetes   Recent Relevant Labs: Lab Results  Component Value Date/Time   HGBA1C 5.6 07/07/2020 10:01 AM   HGBA1C 5.8 (H) 06/26/2018 09:23 AM   HGBA1C 5.8 11/18/2017 09:33 AM   HGBA1C 5.8 (H) 03/13/2017 11:37 AM   HGBA1C 6.3 01/18/2014 06:32 AM   MICROALBUR 20 06/23/2020 02:10 PM   MICROALBUR 50 11/18/2017 09:33 AM     Checking BG: Daily  Recent pre-meal BG readings: 80 - 170 Patient has failed these meds in past: NA Patient is currently controlled on the following medications: metformin 568m daily  Last diabetic Foot exam:  Lab Results  Component Value Date/Time   HMDIABEYEEXA No Retinopathy 12/03/2019 12:00 AM    Last diabetic Eye exam: No results found for: HMDIABFOOTEX   We discussed: How is the foot? Well, healed, d/c Monday  Plan  Continue current medications  Chronic Pain   Patient has failed these meds in past: NA Patient is currently uncontrolled on the following medications:  .Marland KitchenVicodin 5/3257m. Gabapentin 10074mid . Flexeril 5mg80md prn . Tramadol 50mg61m  We discussed:   Hydrocodone 5/325mg 90m needed, almost a year, takes about weekly to every two weeks, effective Gabapentin effective Flexeril, does have spasms Tramadol 50mg b59meffective Joints hurt since getting Covid-19, December 2020 Steroid injection to knees Wants referral to pain clinic Zofran for Vicodin N/V Admits to falls, knees, has trouble with toilet  Plan  Referral to pain management clinic D/c Robaxin as duplicate to Flexeril Continue current medications  Medication Management   [facility] Uses pill box? Yes Pt endorses 100% compliance Copays $0.50/$1, very happy, doesn't want to switch  We discussed:   Plan  Continue current  medication management strategy  Follow up: 3 month phone visit  Karletta Millay HanMilus HeightD, BCGP, [phone]

## 2020-08-20 LAB — URINE CULTURE

## 2020-08-21 LAB — URINE CULTURE

## 2020-08-22 NOTE — Patient Instructions (Signed)
Visit Information  Goals Addressed            This Visit's Progress   . Chronic Care Management       CARE PLAN ENTRY (see longitudinal plan of care for additional care plan information)  Current Barriers:  . Chronic Disease Management support, education, and care coordination needs related to Hypertension, Hyperlipidemia, and Diabetes   Hypertension BP Readings from Last 3 Encounters:  08/18/20 111/72  07/25/20 98/66  07/07/20 107/71   . Pharmacist Clinical Goal(s): o Over the next 90 days, patient will work with PharmD and providers to maintain BP goal <140/90 . Current regimen:  o Hydrochlorothiazide 25mg  daily o Metoprolol 25mg  daily . Interventions: o None . Patient self care activities - Over the next 0- days, patient will: o Check BP weekly, document, and provide at future appointments o Ensure daily salt intake < 2300 mg/day o Report any low blood pressure, hypotension, dizziness, fainting and falls  Hyperlipidemia Lab Results  Component Value Date/Time   LDLCALC 100 (H) 08/18/2020 11:36 AM   LDLCALC 69 01/18/2014 06:32 AM   . Pharmacist Clinical Goal(s): o Over the next 90 days, patient will work with PharmD and providers to achieve LDL goal < 100 . Current regimen:  o None . Interventions: o None . Patient self care activities - Over the next 90 days, patient will: o Reduce bad cholesterol slightly through diet improvements including less saturated fat  Diabetes Lab Results  Component Value Date/Time   HGBA1C 5.6 07/07/2020 10:01 AM   HGBA1C 5.8 (H) 06/26/2018 09:23 AM   HGBA1C 5.8 11/18/2017 09:33 AM   HGBA1C 5.8 (H) 03/13/2017 11:37 AM   HGBA1C 6.3 01/18/2014 06:32 AM   . Pharmacist Clinical Goal(s): o Over the next 90 days, patient will work with PharmD and providers to maintain A1c goal <7% . Current regimen:  o Metformin 500mg  every morning with breakfast . Interventions: o None . Patient self care activities - Over the next 90 days,  patient will: o Check blood sugar once daily, document, and provide at future appointments o Contact provider with any episodes of hypoglycemia  Chronic Pain . Pharmacist Clinical Goal(s) o Over the next 90 days, patient will work with PharmD and providers to reduce pain . Current regimen:  o Vicodin 5/325mg  1 tab by mouth as needed about weekly o Gabapentin 100mg  three times daily o Flexeril 5mg  three times daily as needed o Tramadol 50mg  twice daily . Interventions: o Stop taking Robaxin (methocarbamol) which is a duplicate of Flexeril o Recommended pain management clinic referral . Patient self care activities - Over the next 90 days, patient will: o Follow up on referral to pain management  Medication management . Pharmacist Clinical Goal(s): o Over the next 90 days, patient will work with PharmD and providers to maintain optimal medication adherence . [facility] . Interventions o Comprehensive medication review performed. o Continue current medication management strategy . Patient self care activities - Over the next 90 days, patient will: o Focus on medication adherence by continuing current practices o Take medications as prescribed o Report any questions or concerns to PharmD and/or provider(s)  Initial goal documentation        Ms. Thier was given information about Chronic Care Management services today including:  1. CCM service includes personalized support from designated clinical staff supervised by her physician, including individualized plan of care and coordination with other care providers 2. 24/7 contact phone numbers for assistance for urgent and  routine care needs. 3. Standard insurance, coinsurance, copays and deductibles apply for chronic care management only during months in which we provide at least 20 minutes of these services. Most insurances cover these services at 100%, however patients may be responsible for any copay, coinsurance and/or  deductible if applicable. This service may help you avoid the need for more expensive face-to-face services. 4. Only one practitioner may furnish and bill the service in a calendar month. 5. The patient may stop CCM services at any time (effective at the end of the month) by phone call to the office staff.  Patient agreed to services and verbal consent obtained.   Print copy of patient instructions provided.  Telephone follow up appointment with pharmacy team member scheduled for: 3 months  Milus Height, PharmD, [place], [place] [phone]

## 2020-08-24 ENCOUNTER — Telehealth: Payer: Self-pay

## 2020-08-24 DIAGNOSIS — N390 Urinary tract infection, site not specified: Secondary | ICD-10-CM

## 2020-08-24 MED ORDER — NITROFURANTOIN MONOHYD MACRO 100 MG PO CAPS: 100.0000 mg | ORAL_CAPSULE | Freq: Two times a day (BID) | ORAL | 0 refills | Status: DC

## 2020-08-24 NOTE — Telephone Encounter (Signed)
-----   Message from Jerrol Banana., MD sent at 08/24/2020  3:00 PM EDT ----- UTI.  Treat with nitrofurantoin 100 mg twice a day for 3 days.

## 2020-08-24 NOTE — Telephone Encounter (Signed)
Patient advised and medication sent to pharmacy  

## 2020-08-25 ENCOUNTER — Ambulatory Visit
Admission: RE | Admit: 2020-08-25 | Discharge: 2020-08-25 | Disposition: A | Discharge status: Home / Self Care | Payer: Medicare HMO | Source: Ambulatory Visit | Attending: Family Medicine | Admitting: Family Medicine

## 2020-08-25 ENCOUNTER — Other Ambulatory Visit: Payer: Self-pay

## 2020-08-25 DIAGNOSIS — Z9049 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: Secondary | ICD-10-CM | POA: Diagnosis not present

## 2020-08-25 DIAGNOSIS — R109 Unspecified abdominal pain: Secondary | ICD-10-CM | POA: Diagnosis not present

## 2020-08-25 DIAGNOSIS — R1084 Generalized abdominal pain: Secondary | ICD-10-CM | POA: Insufficient documentation

## 2020-08-29 ENCOUNTER — Telehealth: Payer: Self-pay

## 2020-08-29 NOTE — Telephone Encounter (Signed)
Patient advised of ultrasound results.

## 2020-08-29 NOTE — Telephone Encounter (Signed)
-----   Message from Jerrol Banana., MD sent at 08/25/2020  7:44 PM EDT ----- Normal [place]

## 2020-08-30 NOTE — Progress Notes (Signed)
Trena Platt Mettie Roylance,acting as a scribe for [name], MD.,have documented all relevant documentation on the behalf of [name], MD,as directed by  [name], MD while in the presence of [name], MD.  Established patient visit   Patient: Julie Acevedo   DOB: 06-Aug-1951   69 y.o. Female  MRN: [id number] Visit Date: 09/01/2020  Today's healthcare provider: [name], MD   Chief Complaint  Patient presents with  . Follow-up   Subjective    HPI  We talked extensively today about the patient's background.  Her grandmother was at [place].  Patient presents today in office for a 2 week follow up on abdominal pain and back pain. Patient says that her stomach has been feeling better. Patient says that her back pain is still there and radiating to knees. Patient says that she is still having vaginal discomfort but she is due to see the urologist today,   Generalized abdominal pain- 08/18/2020 Obtain ultrasound of this patient who has had multiple abdominal surgeries  Low back pain radiating to right lower extremity Give Toradol today.  May need further imaging but she has had back surgeries also in the past. Patient has had 2 LS-spine surgeries in her LS spine is fused.  She also has osteoarthritis of the back. Recently she is having right greater than left knee pain that hurts in the right lateral part of the knee.  Worse with weightbearing.       Medications: Outpatient Medications Prior to Visit  Medication Sig  . cyclobenzaprine (FLEXERIL) 5 MG tablet TAKE 1 TABLET BY MOUTH THREE TIMES A DAY AS NEEDED FOR MUSCLE SPASMS (Patient taking differently: 5 mg in the morning and at bedtime. )  . gabapentin (NEURONTIN) 100 MG capsule Take 100 mg by mouth in the morning, at noon, and at bedtime.   . hydrochlorothiazide (HYDRODIURIL) 25 MG tablet Take 25 mg by mouth daily.  Marland Kitchen LINZESS 290 MCG CAPS capsule TAKE 1 CAPSULE (290 MCG TOTAL) BY  MOUTH DAILY BEFORE BREAKFAST.  . metFORMIN (GLUCOPHAGE) 500 MG tablet TAKE 1 TABLET (500 MG TOTAL) BY MOUTH 2 (TWO) TIMES DAILY WITH A MEAL. (Patient taking differently: 500 mg daily. )  . metoprolol succinate (TOPROL-XL) 25 MG 24 hr tablet Take 25 mg by mouth daily.  Marland Kitchen omeprazole (PRILOSEC) 20 MG capsule TAKE 1 CAPSULE BY MOUTH EVERY DAY  . ondansetron (ZOFRAN) 4 MG tablet Take 1 tablet (4 mg total) by mouth every 8 (eight) hours as needed.  . traMADol (ULTRAM) 50 MG tablet Take 1 tablet (50 mg total) by mouth every 6 (six) hours as needed. (Patient taking differently: Take 50 mg by mouth 2 (two) times daily. )  . traZODone (DESYREL) 50 MG tablet TAKE 1 TABLET (50 MG TOTAL) BY MOUTH AT BEDTIME AS NEEDED. (Patient taking differently: 50 mg at bedtime. )  . Vitamin D, Ergocalciferol, (DRISDOL) 1.25 MG (50000 UNIT) CAPS capsule TAKE 1 CAPSULE (50,000 UNITS TOTAL) BY MOUTH EVERY 7 (SEVEN) DAYS.  Marland Kitchen amoxicillin (AMOXIL) 500 MG capsule Take 1 capsule (500 mg total) by mouth 3 (three) times daily. (Patient not taking: Reported on 08/19/2020)  . hydrochlorothiazide (HYDRODIURIL) 12.5 MG tablet Take 1 tablet (12.5 mg total) by mouth daily. (Patient not taking: Reported on 08/19/2020)  . methocarbamol (ROBAXIN) 500 MG tablet Take 500 mg by mouth 2 (two) times daily. Back pain (Patient not taking: Reported on 08/19/2020)  . mupirocin ointment (BACTROBAN) 2 % Apply to  wound after soaking BID (Patient not taking: Reported on 08/19/2020)  . [DISCONTINUED] nitrofurantoin, macrocrystal-monohydrate, (MACROBID) 100 MG capsule Take 1 capsule (100 mg total) by mouth 2 (two) times daily. (Patient not taking: Reported on 09/01/2020)   No facility-administered medications prior to visit.    Review of Systems     Objective    BP 112/77 (BP Location: Right Arm, Patient Position: Sitting, Cuff Size: Large)   Pulse 70   Temp 98.3 F (36.8 C) (Oral)   Ht 5\' 3"  (1.6 m)   Wt 134 lb 9.6 oz (61.1 kg)   BMI 23.84 kg/m  Wt  Readings from Last 3 Encounters:  09/01/20 134 lb 9.6 oz (61.1 kg)  08/18/20 135 lb 3.2 oz (61.3 kg)  07/25/20 133 lb (60.3 kg)      Physical Exam Vitals reviewed.  Constitutional:      General: She is not in acute distress.    Appearance: She is well-developed.  HENT:     Head: Normocephalic and atraumatic.  Eyes:     General: No scleral icterus.    Conjunctiva/sclera: Conjunctivae normal.  Cardiovascular:     Rate and Rhythm: Normal rate and regular rhythm.  Pulmonary:     Effort: Pulmonary effort is normal. No respiratory distress.  Musculoskeletal:     Lumbar back: Decreased range of motion.     Comments: Tender over right lateral knee.  Mild effusion of the right knee.  Skin:    General: Skin is warm and dry.     Findings: No rash.  Neurological:     Mental Status: She is alert and oriented to person, place, and time.  Psychiatric:        Behavior: Behavior normal.       No results found for any visits on 09/01/20.  Assessment & Plan     1. Low back pain radiating to right lower extremity DDD and OA.  2. Generalized abdominal pain Resolved  3. Need for influenza vaccination  - Flu Vaccine QUAD High Dose(Fluad)  4. Pain in both knees, unspecified chronicity Try Toradol 60 and consider referring to orthopedics for knee pain - DG Knee Complete 4 Views Left - DG Knee Complete 4 Views Right  5. Essential hypertension   6. Type 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, without long-term current use of insulin (HCC)   7. Vaginal vault prolapse Patient has appointment with Dr. McDermiatt later today for cystocele.  8. Mixed hyperlipidemia     No follow-ups on file.          Richard Cranford Mon, MD  [facility] [phone] (phone) [phone] (fax)  [place]

## 2020-08-31 ENCOUNTER — Telehealth: Payer: Self-pay

## 2020-08-31 NOTE — Telephone Encounter (Signed)
Copied from [place] [phone]. Topic: General - Other >> Aug 31, 2020  3:29 PM Alanda Slim E wrote: Reason for CRM: Pt needs her OV from her CPE/ internal exam and labs sent to Dr. Marney Doctor fax# [phone] they need those asap /pt has appt with him tomorrow afternoon

## 2020-08-31 NOTE — Telephone Encounter (Signed)
OV from 08/18/20 has not been signed by Dr Rosanna Randy yet. Also, pt needs to sign a release form before sending records.

## 2020-09-01 ENCOUNTER — Other Ambulatory Visit: Payer: Self-pay

## 2020-09-01 ENCOUNTER — Ambulatory Visit (INDEPENDENT_AMBULATORY_CARE_PROVIDER_SITE_OTHER): Payer: Medicare HMO | Admitting: Family Medicine

## 2020-09-01 ENCOUNTER — Encounter: Payer: Self-pay | Admitting: Family Medicine

## 2020-09-01 ENCOUNTER — Ambulatory Visit
Admission: RE | Admit: 2020-09-01 | Discharge: 2020-09-01 | Disposition: A | Discharge status: Home / Self Care | Payer: Medicare HMO | Source: Ambulatory Visit | Attending: Family Medicine | Admitting: Family Medicine

## 2020-09-01 ENCOUNTER — Ambulatory Visit
Admission: RE | Admit: 2020-09-01 | Discharge: 2020-09-01 | Disposition: A | Discharge status: Home / Self Care | Payer: Medicare HMO | Attending: Family Medicine | Admitting: Family Medicine

## 2020-09-01 VITALS — BP 112/77 | HR 70 | Temp 98.3°F | Ht 63.0 in | Wt 134.6 lb

## 2020-09-01 DIAGNOSIS — I10 Essential (primary) hypertension: Secondary | ICD-10-CM | POA: Diagnosis not present

## 2020-09-01 DIAGNOSIS — Z23 Encounter for immunization: Secondary | ICD-10-CM | POA: Diagnosis not present

## 2020-09-01 DIAGNOSIS — E782 Mixed hyperlipidemia: Secondary | ICD-10-CM

## 2020-09-01 DIAGNOSIS — M25561 Pain in right knee: Secondary | ICD-10-CM

## 2020-09-01 DIAGNOSIS — M25562 Pain in left knee: Secondary | ICD-10-CM | POA: Diagnosis not present

## 2020-09-01 DIAGNOSIS — R1084 Generalized abdominal pain: Secondary | ICD-10-CM | POA: Diagnosis not present

## 2020-09-01 DIAGNOSIS — M170 Bilateral primary osteoarthritis of knee: Secondary | ICD-10-CM | POA: Diagnosis not present

## 2020-09-01 DIAGNOSIS — M25569 Pain in unspecified knee: Secondary | ICD-10-CM | POA: Insufficient documentation

## 2020-09-01 DIAGNOSIS — M545 Low back pain, unspecified: Secondary | ICD-10-CM

## 2020-09-01 DIAGNOSIS — N8111 Cystocele, midline: Secondary | ICD-10-CM | POA: Diagnosis not present

## 2020-09-01 DIAGNOSIS — N390 Urinary tract infection, site not specified: Secondary | ICD-10-CM | POA: Diagnosis not present

## 2020-09-01 DIAGNOSIS — N1832 Chronic kidney disease, stage 3b: Secondary | ICD-10-CM

## 2020-09-01 DIAGNOSIS — E1121 Type 2 diabetes mellitus with diabetic nephropathy: Secondary | ICD-10-CM

## 2020-09-01 DIAGNOSIS — E1122 Type 2 diabetes mellitus with diabetic chronic kidney disease: Secondary | ICD-10-CM

## 2020-09-01 DIAGNOSIS — N819 Female genital prolapse, unspecified: Secondary | ICD-10-CM | POA: Diagnosis not present

## 2020-09-01 DIAGNOSIS — M79604 Pain in right leg: Secondary | ICD-10-CM

## 2020-09-05 ENCOUNTER — Telehealth: Payer: Self-pay | Admitting: *Deleted

## 2020-09-05 NOTE — Telephone Encounter (Signed)
-----   Message from Jerrol Banana., MD sent at 09/02/2020 10:09 AM EDT ----- Mild to moderate arthritic changes of both knees.

## 2020-09-05 NOTE — Telephone Encounter (Signed)
Tried calling pt with results. Line was busy. Will try again later. Okay for [facility] triage to give pt results.

## 2020-09-09 ENCOUNTER — Other Ambulatory Visit: Payer: Self-pay

## 2020-09-09 ENCOUNTER — Inpatient Hospital Stay: Payer: Medicare HMO | Attending: Oncology

## 2020-09-09 ENCOUNTER — Encounter: Payer: Self-pay | Admitting: Oncology

## 2020-09-09 DIAGNOSIS — D509 Iron deficiency anemia, unspecified: Secondary | ICD-10-CM | POA: Diagnosis not present

## 2020-09-09 LAB — CBC WITH DIFFERENTIAL/PLATELET
Abs Immature Granulocytes: 0.02 10*3/uL (ref 0.00–0.07)
Basophils Absolute: 0 10*3/uL (ref 0.0–0.1)
Basophils Relative: 0 %
Eosinophils Absolute: 0.2 10*3/uL (ref 0.0–0.5)
Eosinophils Relative: 3 %
HCT: 35 % — ABNORMAL LOW (ref 36.0–46.0)
Hemoglobin: 11.6 g/dL — ABNORMAL LOW (ref 12.0–15.0)
Immature Granulocytes: 0 %
Lymphocytes Relative: 33 %
Lymphs Abs: 2 10*3/uL (ref 0.7–4.0)
MCH: 29.9 pg (ref 26.0–34.0)
MCHC: 33.1 g/dL (ref 30.0–36.0)
MCV: 90.2 fL (ref 80.0–100.0)
Monocytes Absolute: 0.5 10*3/uL (ref 0.1–1.0)
Monocytes Relative: 8 %
Neutro Abs: 3.4 10*3/uL (ref 1.7–7.7)
Neutrophils Relative %: 56 %
Platelets: 321 10*3/uL (ref 150–400)
RBC: 3.88 MIL/uL (ref 3.87–5.11)
RDW: 13.1 % (ref 11.5–15.5)
WBC: 6.1 10*3/uL (ref 4.0–10.5)
nRBC: 0 % (ref 0.0–0.2)

## 2020-09-09 LAB — IRON AND TIBC
Iron: 74 ug/dL (ref 28–170)
Saturation Ratios: 34 % — ABNORMAL HIGH (ref 10.4–31.8)
TIBC: 221 ug/dL — ABNORMAL LOW (ref 250–450)
UIBC: 147 ug/dL

## 2020-09-09 LAB — FERRITIN: Ferritin: 301 ng/mL (ref 11–307)

## 2020-09-09 NOTE — Progress Notes (Signed)
Patient denies any concerns today.  

## 2020-09-09 NOTE — Telephone Encounter (Signed)
Patient was notified of results. Expressed understanding.  

## 2020-09-12 ENCOUNTER — Inpatient Hospital Stay: Payer: Medicare HMO

## 2020-09-12 ENCOUNTER — Inpatient Hospital Stay: Payer: Medicare HMO | Admitting: Oncology

## 2020-09-13 NOTE — Progress Notes (Signed)
This encounter was created in error - please disregard.

## 2020-09-24 LAB — CUP PACEART REMOTE DEVICE CHECK
Date Time Interrogation Session: 20211008234827
Implantable Pulse Generator Implant Date: 20190405

## 2020-09-30 ENCOUNTER — Other Ambulatory Visit: Payer: Self-pay | Admitting: Family Medicine

## 2020-09-30 DIAGNOSIS — M545 Low back pain, unspecified: Secondary | ICD-10-CM

## 2020-09-30 DIAGNOSIS — M5417 Radiculopathy, lumbosacral region: Secondary | ICD-10-CM

## 2020-09-30 NOTE — Telephone Encounter (Signed)
Requested medication (s) are due for refill today: yes  Requested medication (s) are on the active medication list: yes  Last refill:  03/15/20 Tramadol #100  3 refills , 08/09/20 Cyclobenzaprine #30  2 refills   Future visit scheduled: yes  Notes to clinic:  Not delegated    Requested Prescriptions  Pending Prescriptions Disp Refills   traMADol (ULTRAM) 50 MG tablet [Pharmacy Med Name: TRAMADOL HCL 50 MG TABLET] 100 tablet     Sig: Take 1 tablet (50 mg total) by mouth every 6 (six) hours as needed.      Not Delegated - Analgesics:  Opioid Agonists Failed - 09/30/2020  9:03 AM      Failed - This refill cannot be delegated      Failed - Urine Drug Screen completed in last 360 days.      Passed - Valid encounter within last 6 months    Recent Outpatient Visits           4 weeks ago Low back pain radiating to right lower extremity   [facility] Jerrol Banana., MD   1 month ago Annual physical exam   [facility] Jerrol Banana., MD   2 months ago Diabetic infection of left foot [facility])   [facility] Jerrol Banana., MD   2 months ago Low back pain radiating to right lower extremity   [facility] Jerrol Banana., MD   3 months ago Low back pain radiating to right lower extremity   [facility] Jerrol Banana., MD       Future Appointments             In 3 months Jerrol Banana., MD [facility]              cyclobenzaprine (FLEXERIL) 5 MG tablet [Pharmacy Med Name: CYCLOBENZAPRINE 5 MG TABLET] 30 tablet 2    Sig: TAKE 1 TABLET BY MOUTH THREE TIMES A DAY AS NEEDED FOR MUSCLE SPASMS      Not Delegated - Analgesics:  Muscle Relaxants Failed - 09/30/2020  9:03 AM      Failed - This refill cannot be delegated      Passed - Valid encounter within last 6 months    Recent Outpatient Visits           4 weeks ago Low back pain radiating to  right lower extremity   [facility] Jerrol Banana., MD   1 month ago Annual physical exam   [facility] Jerrol Banana., MD   2 months ago Diabetic infection of left foot [facility])   [facility] Jerrol Banana., MD   2 months ago Low back pain radiating to right lower extremity   [facility] Jerrol Banana., MD   3 months ago Low back pain radiating to right lower extremity   [facility] Jerrol Banana., MD       Future Appointments             In 3 months Jerrol Banana., MD [facility] [place]

## 2020-10-03 ENCOUNTER — Ambulatory Visit (INDEPENDENT_AMBULATORY_CARE_PROVIDER_SITE_OTHER): Payer: Medicare HMO

## 2020-10-03 DIAGNOSIS — R55 Syncope and collapse: Secondary | ICD-10-CM | POA: Diagnosis not present

## 2020-10-04 ENCOUNTER — Ambulatory Visit: Payer: Medicare HMO | Admitting: Family Medicine

## 2020-10-05 NOTE — Progress Notes (Signed)
[facility] Summary Report / Loop Recorder 

## 2020-10-12 ENCOUNTER — Other Ambulatory Visit: Payer: Self-pay | Admitting: Family Medicine

## 2020-10-12 DIAGNOSIS — E559 Vitamin D deficiency, unspecified: Secondary | ICD-10-CM

## 2020-10-12 NOTE — Telephone Encounter (Signed)
Requested medication (s) are due for refill today: Yes  Requested medication (s) are on the active medication list: Yes  Last refill:  07/22/20  Future visit scheduled: Yes  Notes to clinic:  See request.    Requested Prescriptions  Pending Prescriptions Disp Refills   Vitamin D, Ergocalciferol, (DRISDOL) 1.25 MG (50000 UNIT) CAPS capsule [Pharmacy Med Name: VITAMIN D2 1.25MG (50,000 UNIT)] 12 capsule 1    Sig: TAKE 1 CAPSULE (50,000 UNITS TOTAL) BY MOUTH EVERY 7 (SEVEN) DAYS.      Endocrinology:  Vitamins - Vitamin D Supplementation Failed - 10/12/2020  9:06 AM      Failed - 50,000 IU strengths are not delegated      Failed - Phosphate in normal range and within 360 days    No results found for: PHOS        Failed - Vitamin D in normal range and within 360 days    Vit D, 25-Hydroxy  Date Value Ref Range Status  06/16/2019 8.7 (L) 30.0 - 100.0 ng/mL Final    Comment:    Vitamin D deficiency has been defined by the Institute of Medicine and an Endocrine Society practice guideline as a level of serum 25-OH vitamin D less than 20 ng/mL (1,2). The Endocrine Society went on to further define vitamin D insufficiency as a level between 21 and 29 ng/mL (2). 1. IOM (Institute of Medicine). 2010. Dietary reference    intakes for calcium and D. [place]: The    [employer]. 2. Holick MF, Binkley Moore, Bischoff-Ferrari HA, et al.    Evaluation, treatment, and prevention of vitamin D    deficiency: an Endocrine Society clinical practice    guideline. JCEM. 2011 Jul; 96(7):1911-30.           Passed - Ca in normal range and within 360 days    Calcium  Date Value Ref Range Status  08/18/2020 9.1 8.7 - 10.3 mg/dL Final   Calcium, Total  Date Value Ref Range Status  02/17/2014 9.4 8.5 - 10.1 mg/dL Final   Calcium, Ion  Date Value Ref Range Status  12/08/2019 1.20 1.15 - 1.40 mmol/L Final          Passed - Valid encounter within last 12 months    Recent  Outpatient Visits           1 month ago Low back pain radiating to right lower extremity   [facility] Jerrol Banana., MD   1 month ago Annual physical exam   [facility] Jerrol Banana., MD   2 months ago Diabetic infection of left foot [facility])   [facility] Jerrol Banana., MD   3 months ago Low back pain radiating to right lower extremity   [facility] Jerrol Banana., MD   3 months ago Low back pain radiating to right lower extremity   [facility] Jerrol Banana., MD       Future Appointments             In 2 months Jerrol Banana., MD [facility] [place]

## 2020-10-17 IMAGING — CR DG CHEST 2V
1 series · 2 of 2 positions shown · non-contrast
Comparison: Chest radiograph dated 03/18/2018

CLINICAL DATA: 67-year-old female with chest pain.

EXAM:
CHEST - 2 VIEW

[Series 1: dg chest 2 view · 0.14mm/px · 2 of 2 slices shown]
[im 1/2]
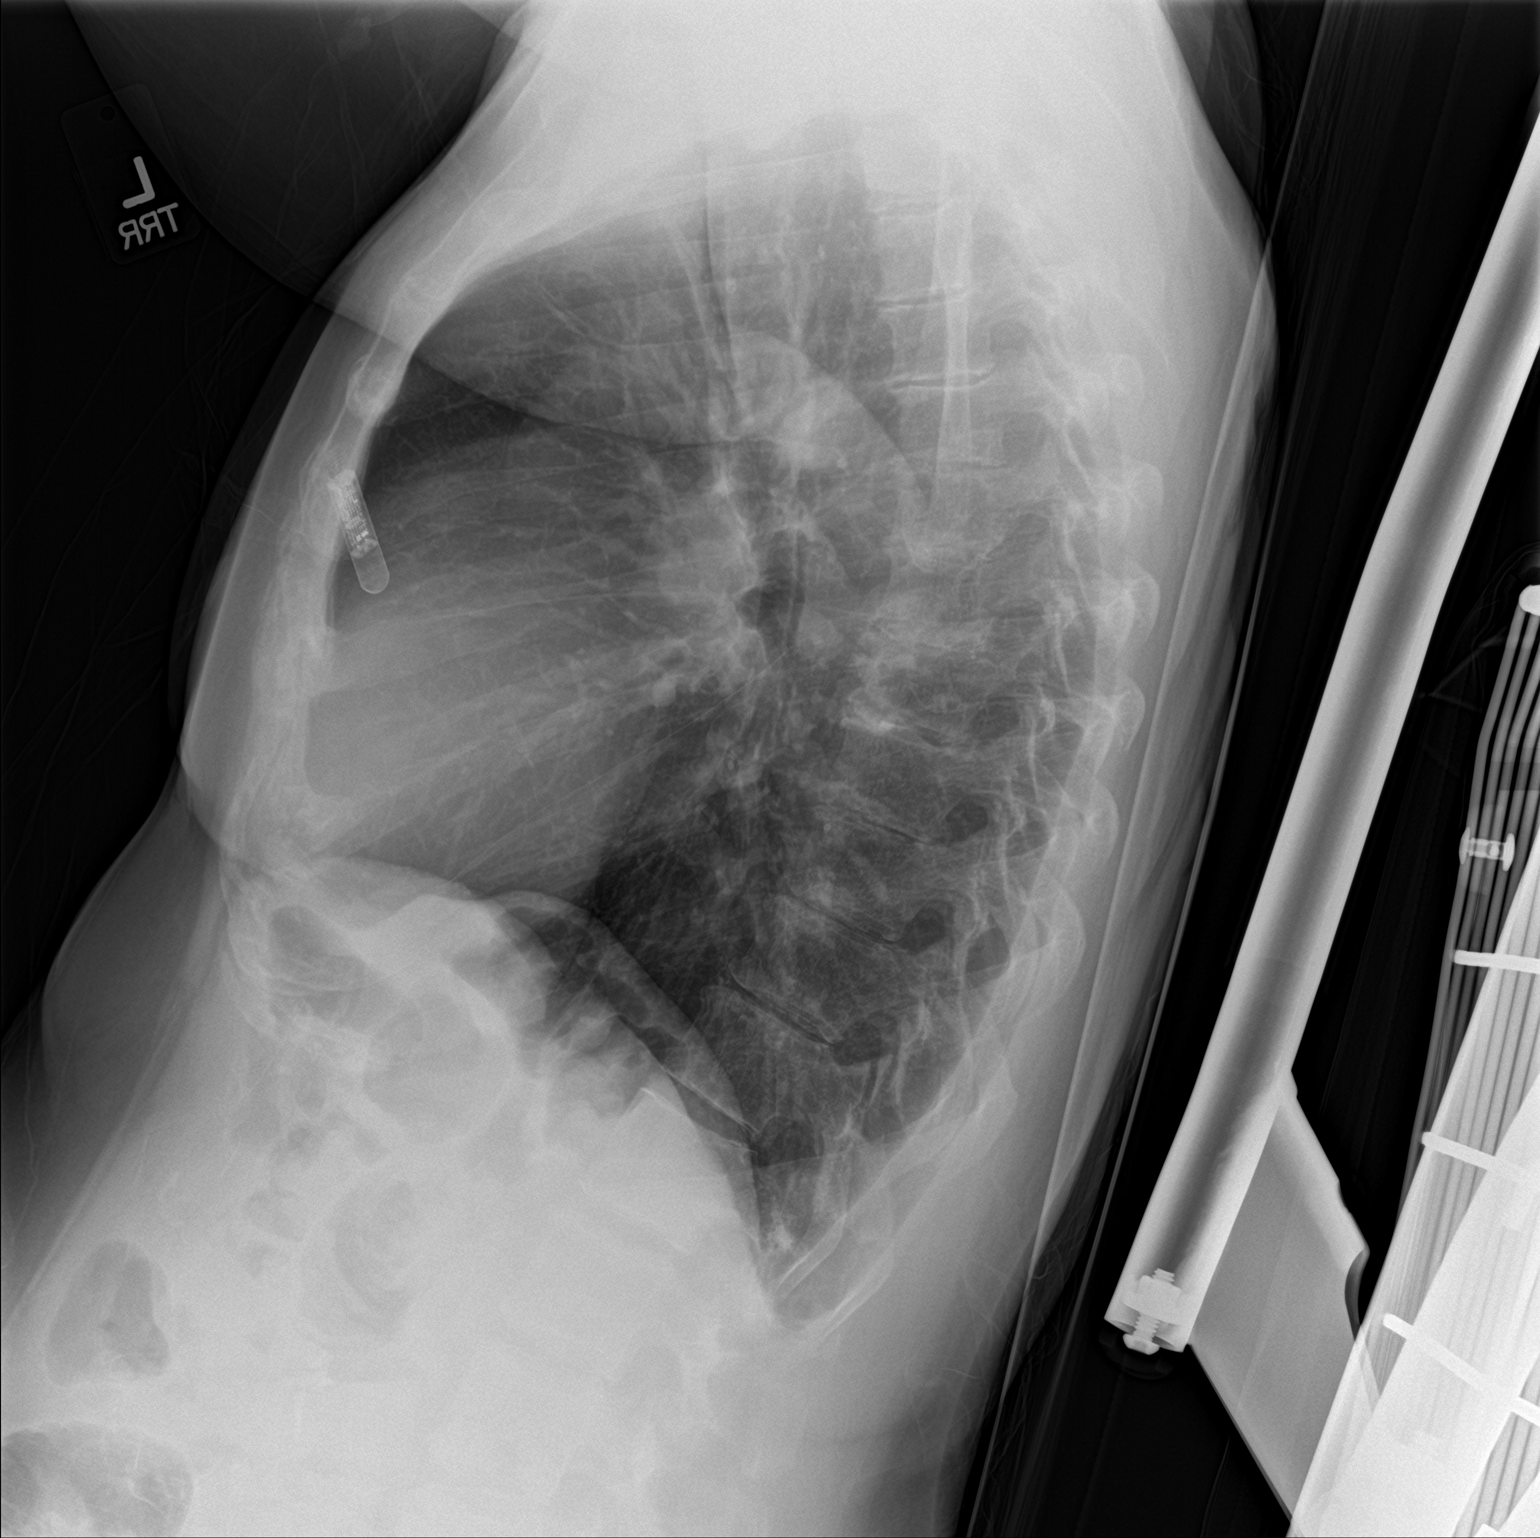
[im 2/2]
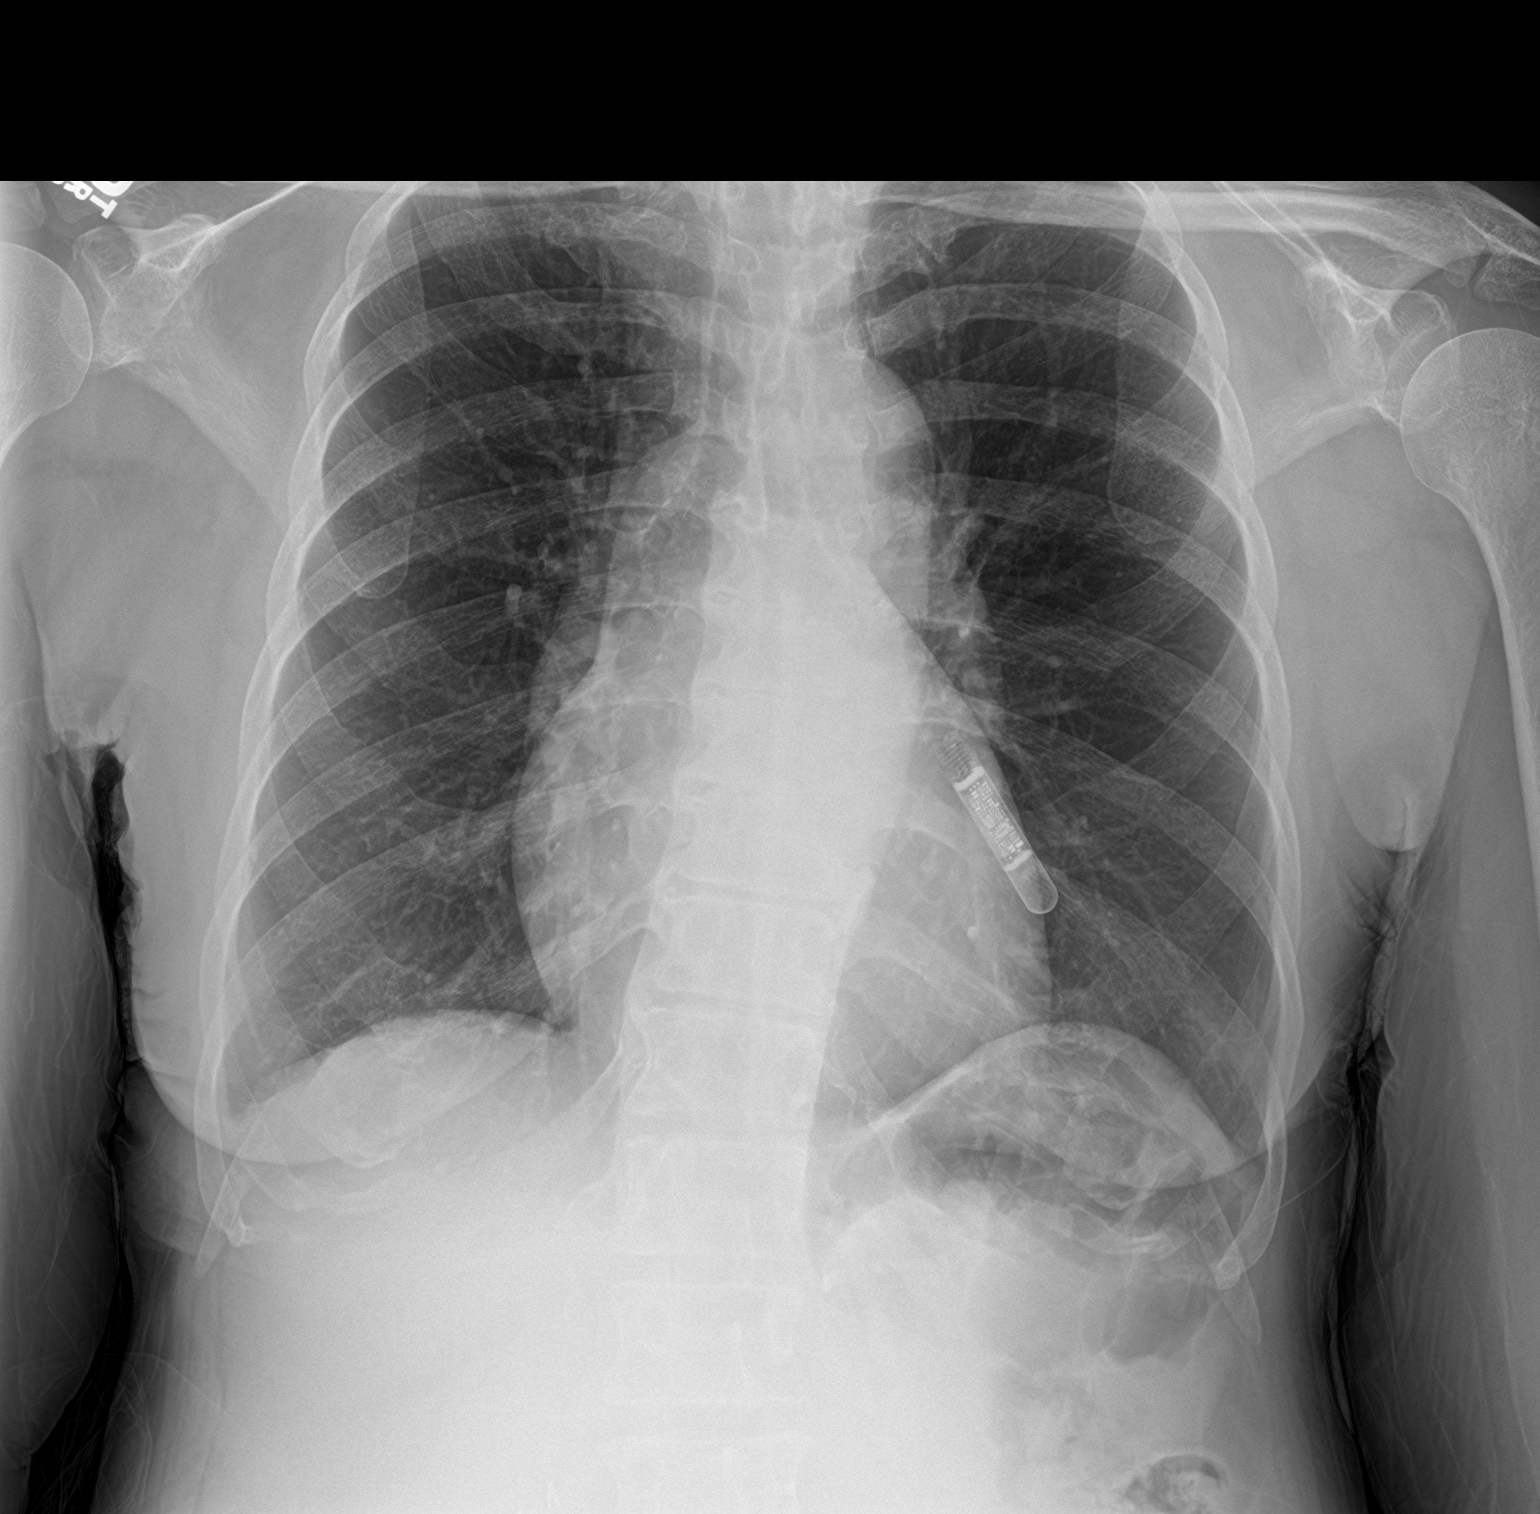

[2 of 2 positions shown; findings below may reference images not displayed]

FINDINGS: The lungs are clear. There is no pleural effusion or pneumothorax.
The cardiac silhouette is within normal limits. Loop recorder device
is noted. No acute osseous pathology.
IMPRESSION: No active cardiopulmonary disease.

## 2020-11-10 IMAGING — MG DIGITAL DIAGNOSTIC BILATERAL MAMMOGRAM WITH TOMO AND CAD
8 of 12 series · 9 of 32 positions shown · non-contrast
Comparison: Previous exam(s).

CLINICAL DATA: Annual examination and 2 year follow-up of right
breast calcifications.

EXAM:
DIGITAL DIAGNOSTIC BILATERAL MAMMOGRAM WITH CAD AND TOMO

[R ML]
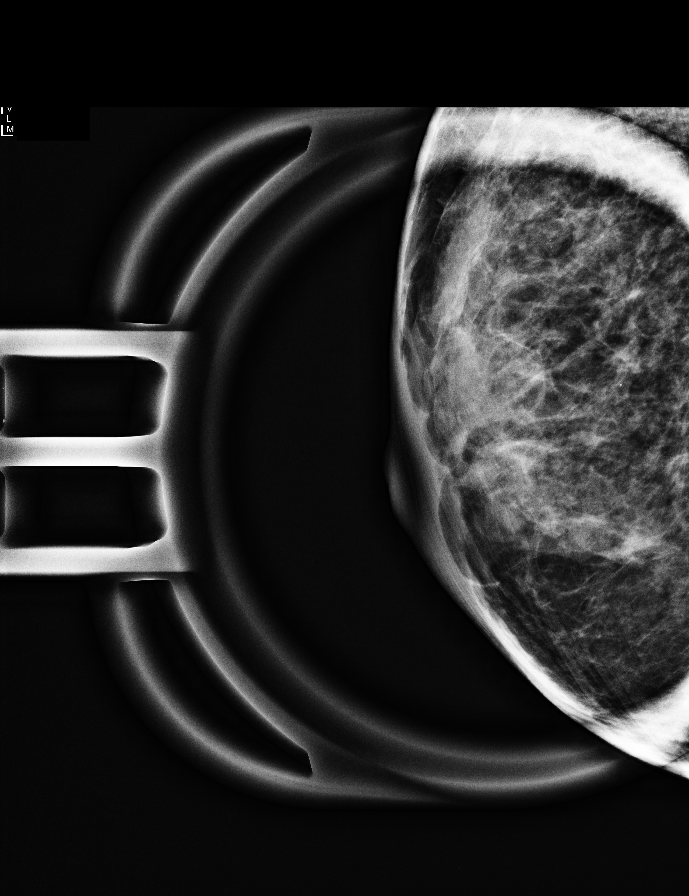

[R CC]
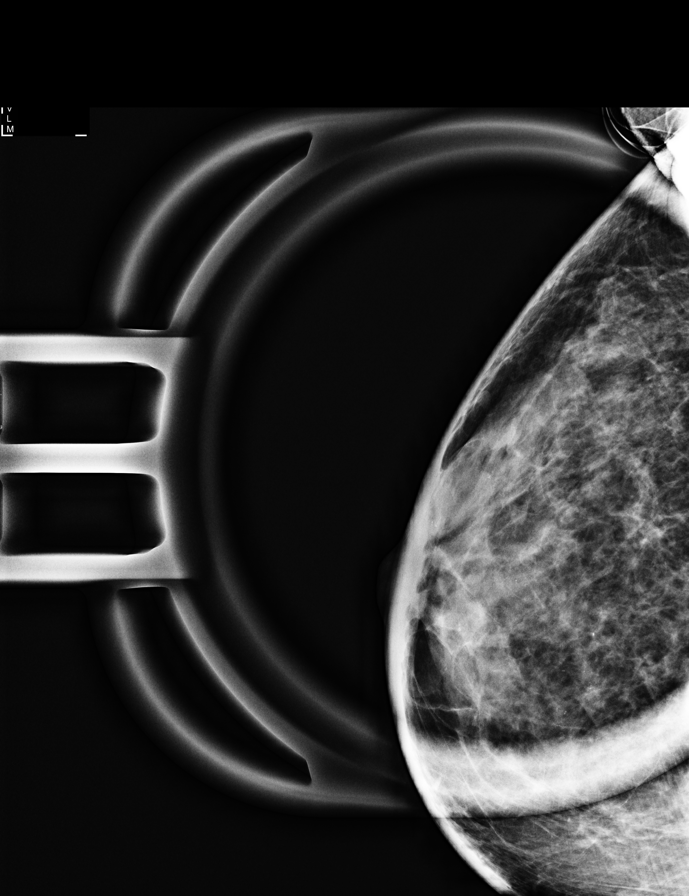

[R CC synth-2D (1 of 2)]
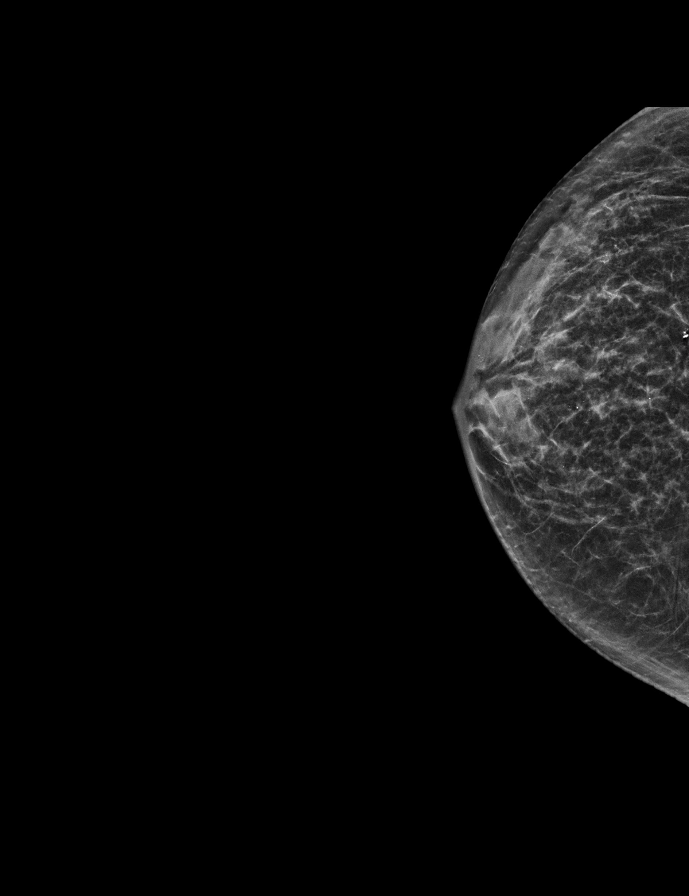

[R MLO synth-2D]
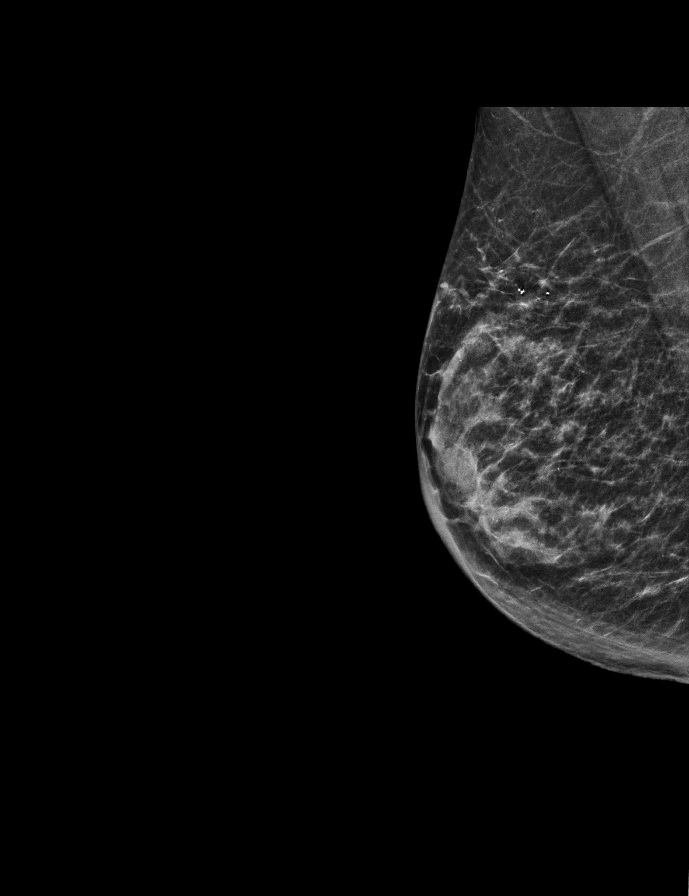

[R CC synth-2D (2 of 2)]
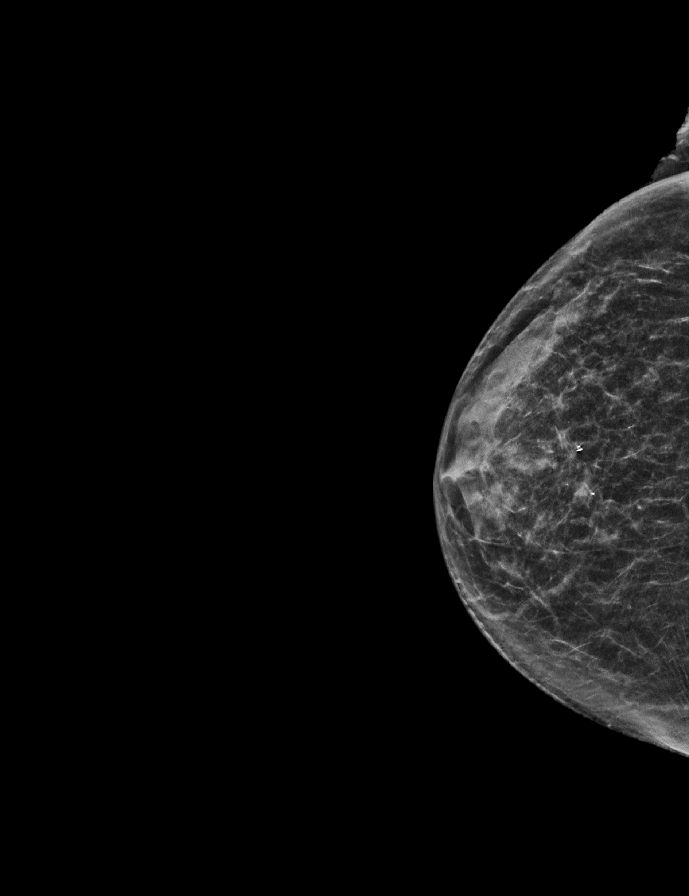

[L MLO synth-2D]
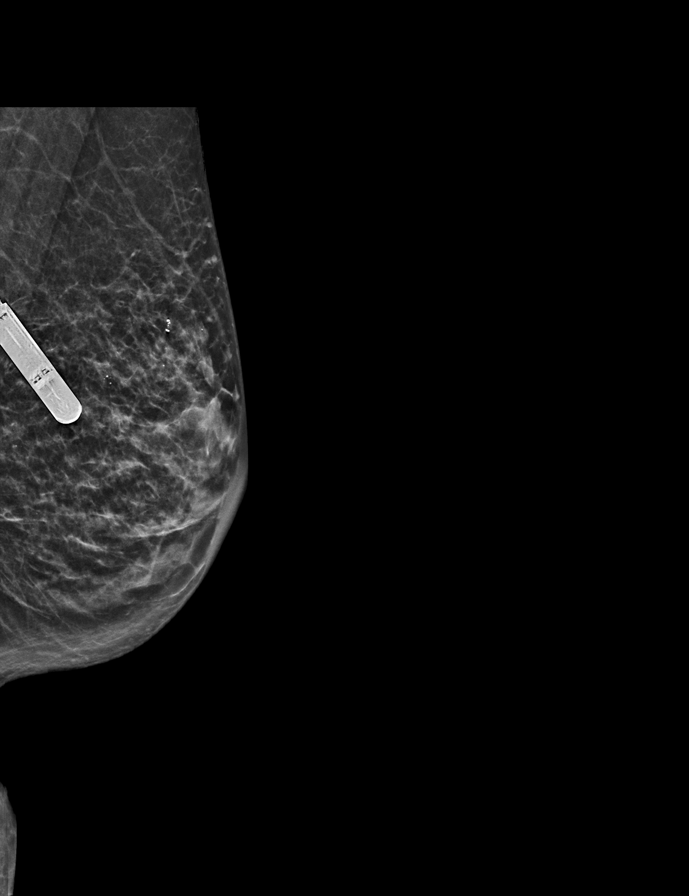

[L CC synth-2D]
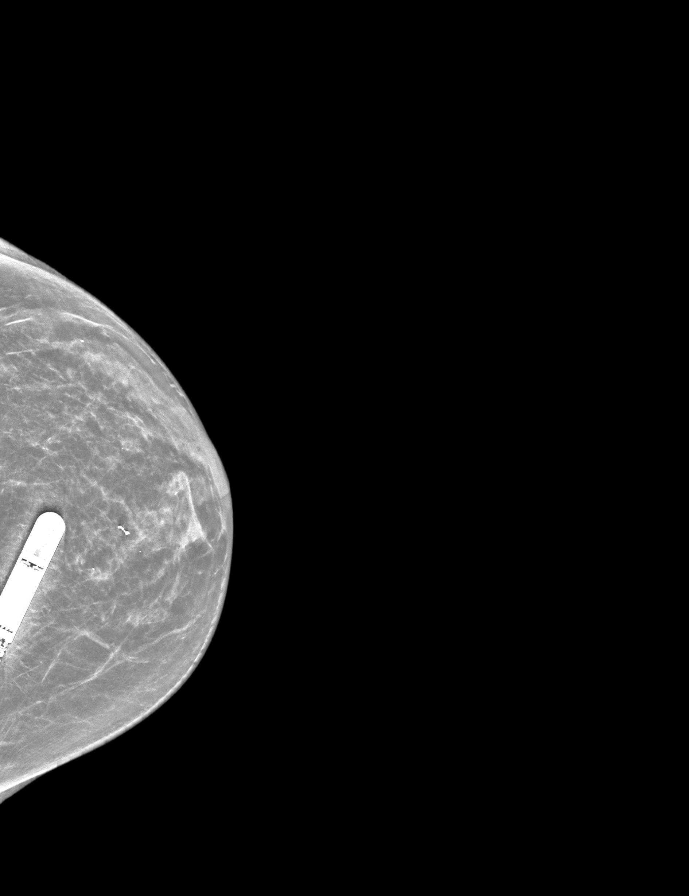

[L CC tomo · 2 of 48 frames shown]
[frame 16/48]
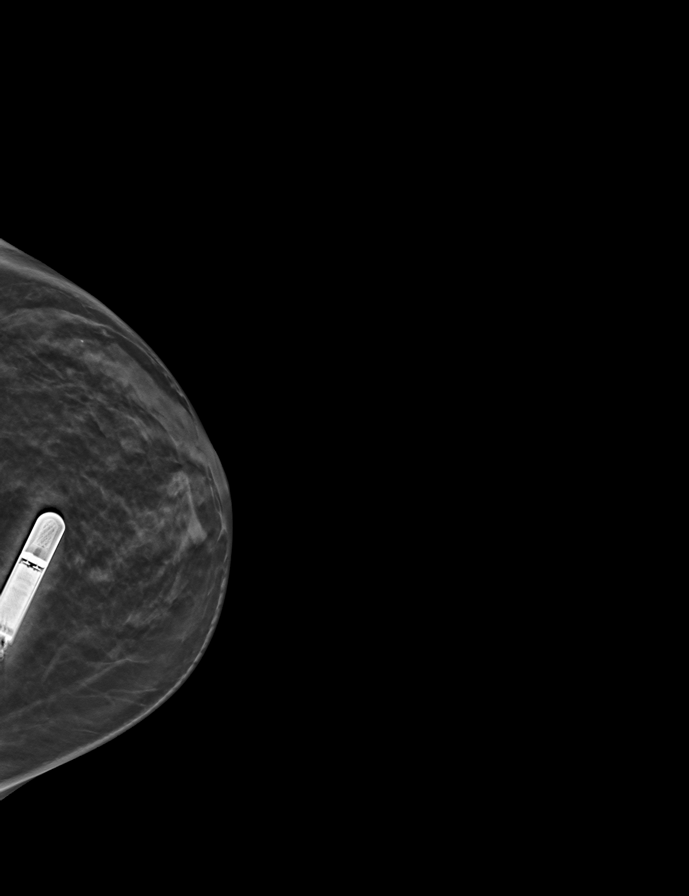
[frame 25/48]
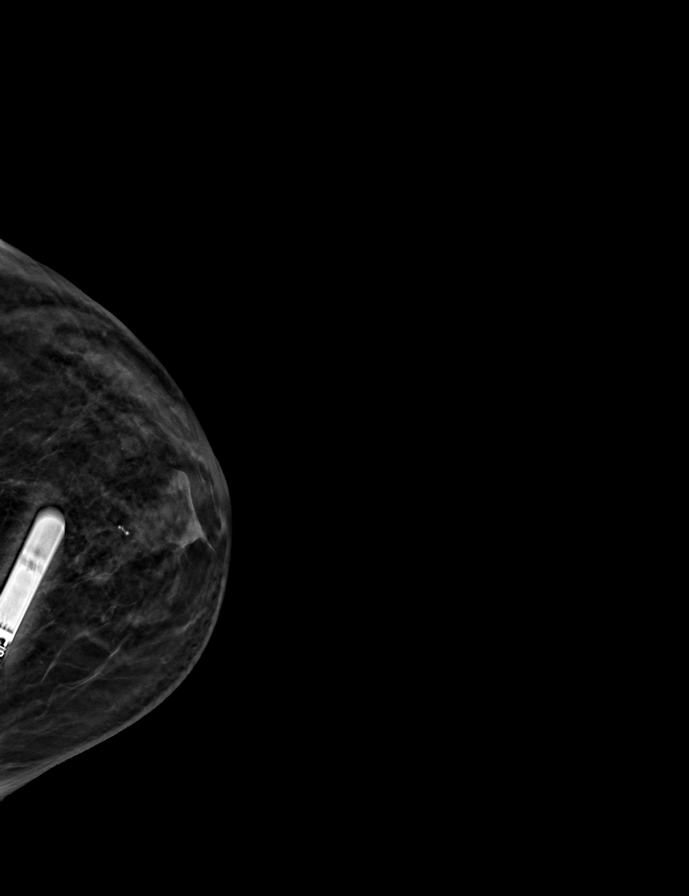

[9 of 32 positions shown; findings below may reference images not displayed]

ACR Breast Density Category c: The breast tissue is heterogeneously
dense, which may obscure small masses.
FINDINGS: Loop recorder in the upper inner left breast posteriorly. No mass,
architectural distortion, or suspicious microcalcification is
identified either breast to suggest malignancy. Magnification views
retroareolar right breast show loosely grouped and scattered
benign-appearing calcifications significant change.

Mammographic images were processed with CAD.
IMPRESSION: No evidence of malignancy in either breast. Benign-appearing
calcifications in the right breast are without significant change.

RECOMMENDATION:
Screening mammogram in one year.(Code:[id number])

I have discussed the findings and recommendations with the patient.
Results were also provided in writing at the conclusion of the
visit. If applicable, a reminder letter will be sent to the patient
regarding the next appointment.

BI-RADS CATEGORY  2: Benign.

## 2020-11-18 ENCOUNTER — Other Ambulatory Visit: Payer: Self-pay | Admitting: Family Medicine

## 2020-11-18 DIAGNOSIS — K219 Gastro-esophageal reflux disease without esophagitis: Secondary | ICD-10-CM

## 2020-11-24 ENCOUNTER — Ambulatory Visit: Payer: Medicare HMO | Admitting: Family Medicine

## 2020-11-28 ENCOUNTER — Other Ambulatory Visit: Payer: Self-pay | Admitting: Family Medicine

## 2020-11-28 ENCOUNTER — Ambulatory Visit: Payer: Medicare HMO

## 2020-11-28 DIAGNOSIS — I252 Old myocardial infarction: Secondary | ICD-10-CM

## 2020-11-28 DIAGNOSIS — I10 Essential (primary) hypertension: Secondary | ICD-10-CM

## 2020-11-28 DIAGNOSIS — Z1231 Encounter for screening mammogram for malignant neoplasm of breast: Secondary | ICD-10-CM

## 2020-11-28 DIAGNOSIS — N1832 Chronic kidney disease, stage 3b: Secondary | ICD-10-CM

## 2020-11-28 DIAGNOSIS — E1169 Type 2 diabetes mellitus with other specified complication: Secondary | ICD-10-CM

## 2020-11-28 DIAGNOSIS — E1122 Type 2 diabetes mellitus with diabetic chronic kidney disease: Secondary | ICD-10-CM

## 2020-11-28 NOTE — Chronic Care Management (AMB) (Signed)
Chronic Care Management Pharmacy  Name: BRIANTE Acevedo  MRN: [id number] DOB: February 10, 1951  Chief Complaint/ HPI  Julie Acevedo,  69 y.o. , female presents for their Follow-Up CCM visit with the clinical pharmacist via telephone.  PCP : Jerrol Banana., MD  Their chronic conditions include: Hypertension, Hyperlipidemia, Diabetes, GERD, and Chronic Pain  Office Visits: 9/2 abdominal pain, [place], BP 111/72 P 64 Wt 135 BMI 24.0, poor sleep 8/9L foot infection, [place], BP 98/66 P 69 Wt 133 BMI 23.6, hx gastric bypass, amox 518m tid x 7d 7/22 back pain, [place], BP 107/71 P 76, Wt 131 BMI 23.3, pred taper, Flexeril 536mtid prn, pain free, d/c metformin  Consult Visit: NA  Medications: Outpatient Encounter Medications as of 11/28/2020  Medication Sig  . Vitamin D, Ergocalciferol, (DRISDOL) 1.25 MG (50000 UNIT) CAPS capsule TAKE 1 CAPSULE (50,000 UNITS TOTAL) BY MOUTH EVERY 7 (SEVEN) DAYS.  . Marland Kitchenyclobenzaprine (FLEXERIL) 5 MG tablet Take 1 tablet (5 mg total) by mouth 3 (three) times daily as needed.  . gabapentin (NEURONTIN) 100 MG capsule Take 100 mg by mouth in the morning, at noon, and at bedtime.   . hydrochlorothiazide (HYDRODIURIL) 25 MG tablet Take 25 mg by mouth daily.  . Marland KitchenINZESS 290 MCG CAPS capsule TAKE 1 CAPSULE (290 MCG TOTAL) BY MOUTH DAILY BEFORE BREAKFAST.  . metFORMIN (GLUCOPHAGE) 500 MG tablet TAKE 1 TABLET (500 MG TOTAL) BY MOUTH 2 (TWO) TIMES DAILY WITH A MEAL. (Patient taking differently: 500 mg daily. )  . methocarbamol (ROBAXIN) 500 MG tablet Take 500 mg by mouth 2 (two) times daily. Back pain (Patient not taking: Reported on 08/19/2020)  . metoprolol succinate (TOPROL-XL) 25 MG 24 hr tablet Take 25 mg by mouth daily.  . mupirocin ointment (BACTROBAN) 2 % Apply to wound after soaking BID (Patient not taking: Reported on 08/19/2020)  . omeprazole (PRILOSEC) 20 MG capsule TAKE 1 CAPSULE BY MOUTH EVERY DAY  . ondansetron (ZOFRAN) 4 MG tablet Take 1 tablet (4 mg total)  by mouth every 8 (eight) hours as needed.  . traMADol (ULTRAM) 50 MG tablet TAKE 1 TABLET (50 MG TOTAL) BY MOUTH EVERY 6 (SIX) HOURS AS NEEDED.  . Marland KitchenraZODone (DESYREL) 50 MG tablet TAKE 1 TABLET (50 MG TOTAL) BY MOUTH AT BEDTIME AS NEEDED. (Patient taking differently: 50 mg at bedtime. )  . [DISCONTINUED] diphenhydrAMINE (BENADRYL) 50 MG tablet Patient is to take one tablet 1 hour before test. (Patient not taking: Reported on 04/28/2020)   No facility-administered encounter medications on file as of 11/28/2020.   Current Diagnosis/Assessment:  SDOH Interventions   Flowsheet Row Most Recent Value  SDOH Interventions   Financial Strain Interventions Intervention Not Indicated  Transportation Interventions Intervention Not Indicated     Goals Addressed            This Visit's Progress   . Chronic Care Management       CARE PLAN ENTRY (see longitudinal plan of care for additional care plan information)  Current Barriers:  . Chronic Disease Management support, education, and care coordination needs related to Hypertension, Hyperlipidemia, Diabetes, GERD, and Chronic Pain   Hypertension BP Readings from Last 3 Encounters:  08/18/20 111/72  07/25/20 98/66  07/07/20 107/71   . Pharmacist Clinical Goal(s): o Over the next 90 days, patient will work with PharmD and providers to maintain BP goal <140/90 . Current regimen:  o Hydrochlorothiazide 2567maily o Metoprolol 58m65mily . Interventions: o None . Patient self care activities -  Over the next 90 days, patient will: o Check BP weekly, document, and provide at future appointments o Ensure daily salt intake < 2300 mg/day o Report any low blood pressure, hypotension, dizziness, fainting and falls  Hyperlipidemia Lab Results  Component Value Date/Time   LDLCALC 100 (H) 08/18/2020 11:36 AM   LDLCALC 69 01/18/2014 06:32 AM   . Pharmacist Clinical Goal(s): o Over the next 90 days, patient will work with PharmD and providers to  achieve LDL goal < 100 . Current regimen:  o None . Interventions: o Recommend starting Atorvastatin 40 mg daily . Patient self care activities - Over the next 90 days, patient will: o Reduce bad cholesterol slightly through diet improvements including less saturated fat  Diabetes Lab Results  Component Value Date/Time   HGBA1C 5.6 07/07/2020 10:01 AM   HGBA1C 5.8 (H) 06/26/2018 09:23 AM   HGBA1C 5.8 11/18/2017 09:33 AM   HGBA1C 5.8 (H) 03/13/2017 11:37 AM   HGBA1C 6.3 01/18/2014 06:32 AM   . Pharmacist Clinical Goal(s): o Over the next 90 days, patient will work with PharmD and providers to maintain A1c goal <7% . Current regimen:  o Metformin 571m every morning with breakfast . Interventions: o None . Patient self care activities - Over the next 90 days, patient will: o Check blood sugar once daily, document, and provide at future appointments o Contact provider with any episodes of hypoglycemia  Medication management . Pharmacist Clinical Goal(s): o Over the next 90 days, patient will work with PharmD and providers to maintain optimal medication adherence . [facility] . Interventions o Comprehensive medication review performed. o Continue current medication management strategy . Patient self care activities - Over the next 90 days, patient will: o Focus on medication adherence by continuing current practices o Take medications as prescribed o Report any questions or concerns to PharmD and/or provider(s)      Hypertension   BP goal is:  <130/80  Office blood pressures are  BP Readings from Last 3 Encounters:  09/01/20 112/77  08/18/20 111/72  07/25/20 98/66   Lab Results  Component Value Date   CREATININE 1.52 (H) 08/18/2020   BUN 19 08/18/2020   GFRNONAA 35 (L) 08/18/2020   GFRAA 41 (L) 08/18/2020   NA 143 08/18/2020   K 3.7 08/18/2020   CALCIUM 9.1 08/18/2020   CO2 24 08/18/2020   Patient checks BP at home 1-2x per week Patient home BP  readings are ranging: 90/70, 80/66    Patient has failed these meds in the past: NA Patient is currently controlled on the following medications:  . HCTZ 217mdaily . Metoprolol succinate XL 257maily   We discussed: Patient reports frequent dizziness, especially when bending over. This has been very concerning for her and there are some days where she will not get out of bed due to concerns of falling.   Plan  Recommend decreasing HCTZ to 12.5 mg daily    Hyperlipidemia   LDL goal < 70  Lipid Panel     Component Value Date/Time   CHOL 180 08/18/2020 1136   CHOL 154 01/18/2014 0632   TRIG 100 08/18/2020 1136   TRIG 53 01/18/2014 0632   HDL 62 08/18/2020 1136   HDL 74 (H) 01/18/2014 0632   LDLCALC 100 (H) 08/18/2020 1136   LDLCALC 69 01/18/2014 0632    Hepatic Function Latest Ref Rng & Units 08/18/2020 12/17/2019 09/28/2019  Total Protein 6.0 - 8.5 g/dL 6.8 7.9 6.9  Albumin 3.8 -  4.8 g/dL 4.1 3.6 3.9  AST 0 - 40 IU/L 19 14(L) 16  ALT 0 - 32 IU/L 9 7 -  Alk Phosphatase 48 - 121 IU/L 191(H) 115 170(H)  Total Bilirubin 0.0 - 1.2 mg/dL 0.3 0.4 <0.2     The 10-year ASCVD risk score Mikey Bussing DC Jr., et al., 2013) is: 15.7%   Values used to calculate the score:     Age: 7 years     Sex: Female     Is Non-Hispanic African American: Yes     Diabetic: Yes     Tobacco smoker: No     Systolic Blood Pressure: 817 mmHg     Is BP treated: Yes     HDL Cholesterol: 62 mg/dL     Total Cholesterol: 180 mg/dL   Patient has failed these meds in past: NA Patient is currently uncontrolled on the following medications:  . 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 Discussed at length with patient the need to prevent future CV events, which patient was amenable to. Patient was agreeable to starting a statin to reduce her risk of heart disease.   Could also consider aspirin for secondary prevention, but will defer until later visit given other medication changes.    Plan  Recommend starting atorvastatin 40 mg daily for secondary prevention.   Diabetes   Recent Relevant Labs: Lab Results  Component Value Date/Time   HGBA1C 5.6 07/07/2020 10:01 AM   HGBA1C 5.8 (H) 06/26/2018 09:23 AM   HGBA1C 5.8 11/18/2017 09:33 AM   HGBA1C 5.8 (H) 03/13/2017 11:37 AM   HGBA1C 6.3 01/18/2014 06:32 AM   MICROALBUR 20 06/23/2020 02:10 PM   MICROALBUR 50 11/18/2017 09:33 AM     Checking BG: Daily  Recent pre-meal BG readings: 80-103 normally, sometimes up to 170 if not watching what she eats.    Patient has failed these meds in past: NA Patient is currently controlled on the following medications:   Marland Kitchen Metformin 500 mg twice daily (only taking daily)   Last diabetic Foot exam:  Lab Results  Component Value Date/Time   HMDIABEYEEXA No Retinopathy 12/03/2019 12:00 AM    Last diabetic Eye exam: No results found for: HMDIABFOOTEX   We discussed:  Plan  Recommend decreasing Metformin to 500 mg daily to reflect how patient has been taking the medication and to minimize risk of hypoglycemia with higher dose.   Chronic Pain   Patient has failed these meds in past: NA Patient is currently uncontrolled on the following medications:  Marland Kitchen Vicodin 5/359m . Gabapentin 1020mtid . Flexeril 55m26mid prn . Tramadol 51m43md    Plan  Continue current medications  GERD   Patient denies dysphagia, heartburn or nausea. Expresses understanding to avoid triggers such as citrus juices, large meals and lying down after eating.  Currently controlled on: . Omeprazole 20 mg daily   Plan   Continue current medication.  Misc / OTC    . Vitamin D2 50,000 units weekly  . Ondansetron 4 mg q8hr PRN . Trazodone 50 mg QHS PRN   Plan  Continue current medications   Memory loss   Patient has failed these meds in past: na Patient is currently uncontrolled on the following medications:  . None  We discussed:  Patient reports 3-4 months of memory loss. She  has been forgetting doctor's appointments, directions to places she has been multiple times, and when speaking will forget certain words and events. This has  been very concerning for the patient. She has an appointment with Dr. Rosanna Randy on 12/16 to discuss these changes.   Plan  Dr. Rosanna Randy made aware of reported symptoms.   Vaccines   Reviewed and discussed patient's vaccination history.    Immunization History  Administered Date(s) Administered  . Fluad Quad(high Dose 65+) 08/15/2019, 09/01/2020  . Influenza Split 10/17/2010, 10/02/2011, 08/27/2012  . Influenza, High Dose Seasonal PF 08/29/2018  . Influenza,inj,Quad PF,6+ Mos 09/26/2013, 09/06/2014, 09/06/2016  . Influenza,inj,Quad PF,6-35 Mos 08/31/2017  . Influenza-Unspecified 09/15/2015  . Pneumococcal Conjugate-13 08/29/2018  . Pneumococcal Polysaccharide-23 11/17/2012  . Tdap 10/02/2011, 09/27/2016  . Zoster 09/06/2016  . Zoster Recombinat (Shingrix) 08/29/2018, 08/15/2019    Medication Management   [facility] Uses pill box? Yes Pt endorses 100% compliance Copays $0.50/$1, very happy, doesn't want to switch  We discussed:   Plan  Continue current medication management strategy  Follow up: 3 month phone visit  [place] [phone]

## 2020-11-28 NOTE — Patient Instructions (Signed)
Visit Information It was great speaking with you today!  Please let me know if you have any questions about our visit.  Goals Addressed            This Visit's Progress   . Chronic Care Management       CARE PLAN ENTRY (see longitudinal plan of care for additional care plan information)  Current Barriers:  . Chronic Disease Management support, education, and care coordination needs related to Hypertension, Hyperlipidemia, Diabetes, GERD, and Chronic Pain   Hypertension BP Readings from Last 3 Encounters:  08/18/20 111/72  07/25/20 98/66  07/07/20 107/71   . Pharmacist Clinical Goal(s): o Over the next 90 days, patient will work with PharmD and providers to maintain BP goal <140/90 . Current regimen:  o Hydrochlorothiazide 25mg  daily o Metoprolol 25mg  daily . Interventions: o None . Patient self care activities - Over the next 90 days, patient will: o Check BP weekly, document, and provide at future appointments o Ensure daily salt intake < 2300 mg/day o Report any low blood pressure, hypotension, dizziness, fainting and falls  Hyperlipidemia Lab Results  Component Value Date/Time   LDLCALC 100 (H) 08/18/2020 11:36 AM   LDLCALC 69 01/18/2014 06:32 AM   . Pharmacist Clinical Goal(s): o Over the next 90 days, patient will work with PharmD and providers to achieve LDL goal < 100 . Current regimen:  o None . Interventions: o Recommend starting Atorvastatin 40 mg daily . Patient self care activities - Over the next 90 days, patient will: o Reduce bad cholesterol slightly through diet improvements including less saturated fat  Diabetes Lab Results  Component Value Date/Time   HGBA1C 5.6 07/07/2020 10:01 AM   HGBA1C 5.8 (H) 06/26/2018 09:23 AM   HGBA1C 5.8 11/18/2017 09:33 AM   HGBA1C 5.8 (H) 03/13/2017 11:37 AM   HGBA1C 6.3 01/18/2014 06:32 AM   . Pharmacist Clinical Goal(s): o Over the next 90 days, patient will work with PharmD and providers to maintain A1c goal  <7% . Current regimen:  o Metformin 500mg  every morning with breakfast . Interventions: o None . Patient self care activities - Over the next 90 days, patient will: o Check blood sugar once daily, document, and provide at future appointments o Contact provider with any episodes of hypoglycemia  Medication management . Pharmacist Clinical Goal(s): o Over the next 90 days, patient will work with PharmD and providers to maintain optimal medication adherence . [facility] . Interventions o Comprehensive medication review performed. o Continue current medication management strategy . Patient self care activities - Over the next 90 days, patient will: o Focus on medication adherence by continuing current practices o Take medications as prescribed o Report any questions or concerns to PharmD and/or provider(s)       Ms. Persico was given information about Chronic Care Management services today including:  1. CCM service includes personalized support from designated clinical staff supervised by her physician, including individualized plan of care and coordination with other care providers 2. 24/7 contact phone numbers for assistance for urgent and routine care needs. 3. Standard insurance, coinsurance, copays and deductibles apply for chronic care management only during months in which we provide at least 20 minutes of these services. Most insurances cover these services at 100%, however patients may be responsible for any copay, coinsurance and/or deductible if applicable. This service may help you avoid the need for more expensive face-to-face services. 4. Only one practitioner may furnish and bill the service in a  calendar month. 5. The patient may stop CCM services at any time (effective at the end of the month) by phone call to the office staff.  Patient agreed to services and verbal consent obtained.   The patient verbalized understanding of instructions, educational  materials, and care plan provided today and declined offer to receive copy of patient instructions, educational materials, and care plan.  Telephone follow up appointment with pharmacy team member scheduled for:  02/27/21 at 1:00 PM  [place] [phone]

## 2020-11-30 ENCOUNTER — Encounter: Payer: Self-pay | Admitting: Obstetrics & Gynecology

## 2020-11-30 ENCOUNTER — Ambulatory Visit (INDEPENDENT_AMBULATORY_CARE_PROVIDER_SITE_OTHER): Payer: Medicare HMO | Admitting: Obstetrics & Gynecology

## 2020-11-30 ENCOUNTER — Encounter: Payer: Self-pay | Admitting: Family Medicine

## 2020-11-30 ENCOUNTER — Ambulatory Visit (INDEPENDENT_AMBULATORY_CARE_PROVIDER_SITE_OTHER): Payer: Medicare HMO | Admitting: Family Medicine

## 2020-11-30 ENCOUNTER — Other Ambulatory Visit: Payer: Self-pay

## 2020-11-30 VITALS — BP 96/76 | HR 63 | Temp 98.7°F | Resp 16 | Wt 132.0 lb

## 2020-11-30 VITALS — BP 100/60 | Ht 63.0 in | Wt 131.0 lb

## 2020-11-30 DIAGNOSIS — N952 Postmenopausal atrophic vaginitis: Secondary | ICD-10-CM | POA: Diagnosis not present

## 2020-11-30 DIAGNOSIS — E1122 Type 2 diabetes mellitus with diabetic chronic kidney disease: Secondary | ICD-10-CM

## 2020-11-30 DIAGNOSIS — N9419 Other specified dyspareunia: Secondary | ICD-10-CM | POA: Diagnosis not present

## 2020-11-30 DIAGNOSIS — N8111 Cystocele, midline: Secondary | ICD-10-CM

## 2020-11-30 DIAGNOSIS — I10 Essential (primary) hypertension: Secondary | ICD-10-CM

## 2020-11-30 DIAGNOSIS — R4189 Other symptoms and signs involving cognitive functions and awareness: Secondary | ICD-10-CM | POA: Diagnosis not present

## 2020-11-30 DIAGNOSIS — N1832 Chronic kidney disease, stage 3b: Secondary | ICD-10-CM | POA: Diagnosis not present

## 2020-11-30 DIAGNOSIS — E785 Hyperlipidemia, unspecified: Secondary | ICD-10-CM | POA: Diagnosis not present

## 2020-11-30 DIAGNOSIS — E1169 Type 2 diabetes mellitus with other specified complication: Secondary | ICD-10-CM

## 2020-11-30 DIAGNOSIS — M9979 Connective tissue and disc stenosis of intervertebral foramina of abdomen and other regions: Secondary | ICD-10-CM | POA: Diagnosis not present

## 2020-11-30 DIAGNOSIS — E538 Deficiency of other specified B group vitamins: Secondary | ICD-10-CM | POA: Diagnosis not present

## 2020-11-30 MED ORDER — KETOROLAC TROMETHAMINE 60 MG/2ML IM SOLN
60.0000 mg | Freq: Once | INTRAMUSCULAR | Status: AC
Start: 2020-11-30 — End: 2020-11-30
  Administered 2020-11-30: 60 mg via INTRAMUSCULAR

## 2020-11-30 MED ORDER — REPLENS VA GEL: 1.0000 | VAGINAL | 11 refills | Status: DC

## 2020-11-30 NOTE — Progress Notes (Signed)
Established patient visit   Patient: Julie Acevedo   DOB: 04-13-1951   69 y.o. Female  MRN: [id number] Visit Date: 11/30/2020  Today's healthcare provider: [name], MD   Chief Complaint  Patient presents with  . Memory Loss   Subjective    HPI  Patient here today C/O memory loss worsening in the last several months. Patient reports she has been driving and forgets where she is going. Patient reports getting a life alert line due to memory loss.  On further questioning she states that this is the only time she feels like she really has significant memory problems, and when driving.  No history of syncope or seizure activity.   6CIT Screen 11/30/2020 02/08/2020 12/24/2016  What Year? 0 points 0 points 0 points  What month? 0 points 0 points 0 points  What time? 0 points 0 points 0 points  Count back from 20 0 points 0 points 0 points  Months in reverse 0 points 4 points 0 points  Repeat phrase 6 points 4 points 4 points  Total Score 6 8 4     MMSE - Mini Mental State Exam 11/30/2020 09/01/2015  Orientation to time 4 4  Orientation to Place 5 4  Registration 3 2  Attention/ Calculation 5 5  Recall 2 3  Language- name 2 objects 2 2  Language- repeat 1 0  Language- follow 3 step command 3 2  Language- read & follow direction 1 1  Write a sentence 1 1  Copy design 1 1  Total score 28 25     Patient Active Problem List   Diagnosis Date Noted  . Low back pain radiating to right lower extremity 06/23/2020  . Gastroesophageal reflux disease 04/28/2020  . Large bowel obstruction ([place]) 04/17/2019  . Acquired trigger finger 03/20/2019  . Iron deficiency anemia 11/21/2018  . Syncope 03/18/2018  . Anemia 03/18/2018  . Exophthalmos 02/22/2018  . Multiple thyroid nodules 02/22/2018  . 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 Pain at surgical site 08/30/2016  . Ventral incisional hernia 08/21/2016  . SBO (small bowel obstruction) ([place])   . Partial small bowel  obstruction ([place]) 04/23/2016  . Adjustment disorder with mixed anxiety and depressed mood 04/23/2016  . Memory difficulties 09/01/2015  . Arthritis 06/11/2015  . Back pain, chronic 06/11/2015  . HLD (hyperlipidemia) 06/11/2015  . Cannot sleep 06/11/2015  . L-S radiculopathy 06/11/2015  . Arthritis of knee, degenerative 06/11/2015  . B12 deficiency 06/11/2015  . 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 DM2 (diabetes mellitus, type 2) ([place]) 11/15/2012  . Adiposity 11/21/2009  . Avitaminosis D 11/07/2009  . Narrowing of intervertebral disc space 07/25/2009  . Benign essential HTN 07/25/2009   Social History   Tobacco Use  . Smoking status: Never Smoker  . Smokeless tobacco: Never Used  Vaping Use  . Vaping Use: Never used  Substance Use Topics  . Alcohol use: No  . Drug use: No   Allergies  Allergen Reactions  . Lotrel [Amlodipine Besy-Benazepril Hcl] Anaphylaxis  . Contrast Media [Iodinated Diagnostic Agents] Swelling and Other (See Comments)    Reaction:  Neck swelling   . Niacin Hives  . Sulfa Antibiotics Hives       Medications: Outpatient Medications Prior to Visit  Medication Sig  . cyclobenzaprine (FLEXERIL) 5 MG tablet Take 1 tablet (5 mg total) by mouth 3 (three) times daily as needed.  Marland Kitchen  gabapentin (NEURONTIN) 100 MG capsule Take 100 mg by mouth in the morning, at noon, and at bedtime.   . hydrochlorothiazide (HYDRODIURIL) 25 MG tablet Take 25 mg by mouth daily.  Marland Kitchen LINZESS 290 MCG CAPS capsule TAKE 1 CAPSULE (290 MCG TOTAL) BY MOUTH DAILY BEFORE BREAKFAST.  . metFORMIN (GLUCOPHAGE) 500 MG tablet TAKE 1 TABLET (500 MG TOTAL) BY MOUTH 2 (TWO) TIMES DAILY WITH A MEAL. (Patient taking differently: 500 mg daily.)  . methocarbamol (ROBAXIN) 500 MG tablet Take 500 mg by mouth 2 (two) times daily. Back pain  . metoprolol succinate (TOPROL-XL) 25 MG 24 hr tablet Take 25 mg by mouth daily.  Marland Kitchen omeprazole  (PRILOSEC) 20 MG capsule TAKE 1 CAPSULE BY MOUTH EVERY DAY  . ondansetron (ZOFRAN) 4 MG tablet Take 1 tablet (4 mg total) by mouth every 8 (eight) hours as needed.  . traMADol (ULTRAM) 50 MG tablet TAKE 1 TABLET (50 MG TOTAL) BY MOUTH EVERY 6 (SIX) HOURS AS NEEDED.  Marland Kitchen traZODone (DESYREL) 50 MG tablet TAKE 1 TABLET (50 MG TOTAL) BY MOUTH AT BEDTIME AS NEEDED. (Patient taking differently: 50 mg at bedtime.)  . Vaginal Lubricant (REPLENS) GEL Place 1 Applicatorful vaginally 3 (three) times a week.  . Vitamin D, Ergocalciferol, (DRISDOL) 1.25 MG (50000 UNIT) CAPS capsule TAKE 1 CAPSULE (50,000 UNITS TOTAL) BY MOUTH EVERY 7 (SEVEN) DAYS.  . [DISCONTINUED] mupirocin ointment (BACTROBAN) 2 % Apply to wound after soaking BID (Patient not taking: Reported on 08/19/2020)   No facility-administered medications prior to visit.    Review of Systems  Constitutional: Positive for activity change.  Respiratory: Negative.   Cardiovascular: Negative.   Psychiatric/Behavioral: Positive for confusion, decreased concentration and sleep disturbance.    Last CBC Lab Results  Component Value Date   WBC 6.1 09/09/2020   HGB 11.6 (L) 09/09/2020   HCT 35.0 (L) 09/09/2020   MCV 90.2 09/09/2020   MCH 29.9 09/09/2020   RDW 13.1 09/09/2020   PLT 321 09/09/2020   Last thyroid functions Lab Results  Component Value Date   TSH 2.170 08/18/2020   T4TOTAL 6.5 01/27/2018   Last vitamin D Lab Results  Component Value Date   VD25OH 8.7 (L) 06/16/2019   Last vitamin B12 and Folate Lab Results  Component Value Date   VITAMINB12 293 06/16/2019   FOLATE 17.6 11/18/2018      Objective    BP 96/76 (BP Location: Left Arm, Patient Position: Sitting, Cuff Size: Normal)   Pulse 63   Temp 98.7 F (37.1 C) (Oral)   Resp 16   Wt 132 lb (59.9 kg)   SpO2 98%   BMI 23.38 kg/m  BP Readings from Last 3 Encounters:  11/30/20 96/76  11/30/20 100/60  09/01/20 112/77   Wt Readings from Last 3 Encounters:  11/30/20  132 lb (59.9 kg)  11/30/20 131 lb (59.4 kg)  09/01/20 134 lb 9.6 oz (61.1 kg)      Physical Exam Vitals reviewed.  Constitutional:      Appearance: She is well-developed.  HENT:     Head: Normocephalic and atraumatic.     Right Ear: External ear normal.     Left Ear: External ear normal.     Nose: Nose normal.  Eyes:     General: No scleral icterus.    Conjunctiva/sclera: Conjunctivae normal.     Pupils: Pupils are equal, round, and reactive to light.  Neck:     Thyroid: No thyromegaly.  Cardiovascular:     Rate and  Rhythm: Normal rate and regular rhythm.     Heart sounds: Normal heart sounds.  Pulmonary:     Effort: Pulmonary effort is normal.     Breath sounds: Normal breath sounds.  Abdominal:     Palpations: Abdomen is soft.  Lymphadenopathy:     Cervical: No cervical adenopathy.  Skin:    General: Skin is warm and dry.  Neurological:     General: No focal deficit present.     Mental Status: She is alert and oriented to person, place, and time.  Psychiatric:        Mood and Affect: Mood normal.        Behavior: Behavior normal.        Thought Content: Thought content normal.        Judgment: Judgment normal.     MMSE is 28/30.  6 CIT is 6  No results found for any visits on 11/30/20.  Assessment & Plan     1.Mild  Cognitive impairment Defer treatment at this time and refer to neurology. - CBC with Differential/Platelet - TSH - Vitamin B12 - Folate - HIV antibody (with reflex) - RPR - Ambulatory referral to Neurology  2. Vitamin B 12 deficiency  - CBC with Differential/Platelet - Vitamin B12 - Folate  3. Type 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, without long-term current use of insulin (HCC)  - CBC with Differential/Platelet - Comprehensive metabolic panel - Lipid panel - Hemoglobin A1c  4. Essential hypertension  - CBC with Differential/Platelet - Comprehensive metabolic panel - TSH  5. Hyperlipidemia associated  with type 2 diabetes mellitus (HCC)  - Comprehensive metabolic panel - Lipid panel  6. Narrowing of intervertebral disc space  - ketorolac (TORADOL) injection 60 mg 7.  Chronic anxiety  No follow-ups on file.         Majd Tissue Cranford Mon, MD  [facility] [phone] (phone) [phone] (fax)  [place]

## 2020-11-30 NOTE — Patient Instructions (Signed)
Atrophic Vaginitis  Atrophic vaginitis is a condition in which the tissues that line the vagina become dry and thin. This condition is most common in women who have stopped having regular menstrual periods (are in menopause). This usually starts when a woman is 39-67 years old. That is the time when a woman's estrogen levels begin to drop (decrease). Estrogen is a female hormone. It helps to keep the tissues of the vagina moist. It stimulates the vagina to produce a clear fluid that lubricates the vagina for sexual intercourse. This fluid also protects the vagina from infection. Lack of estrogen can cause the lining of the vagina to get thinner and dryer. The vagina may also shrink in size. It may become less elastic. Atrophic vaginitis tends to get worse over time as a woman's estrogen level drops. What are the causes? This condition is caused by the normal drop in estrogen that happens around the time of menopause. What increases the risk? Certain conditions or situations may lower a woman's estrogen level, leading to a higher risk for atrophic vaginitis. You are more likely to develop this condition if:  You are taking medicines that block estrogen.  You have had your ovaries removed.  You are being treated for cancer with X-ray (radiation) or medicines (chemotherapy).  You have given birth or are breastfeeding.  You are older than age 36.  You smoke. What are the signs or symptoms? Symptoms of this condition include:  Pain, soreness, or bleeding during sexual intercourse (dyspareunia).  Vaginal burning, irritation, or itching.  Pain or bleeding when a speculum is used in a vaginal exam (pelvic exam).  Having burning pain when passing urine.  Vaginal discharge that is brown or yellow. In some cases, there are no symptoms. How is this diagnosed? This condition is diagnosed by taking a medical history and doing a physical exam. This will include a pelvic exam that checks the  vaginal tissues. Though rare, you may also have other tests, including:  A urine test.  A test that checks the acid balance in your vagina (acid balance test). How is this treated? Treatment for this condition depends on how severe your symptoms are. Treatment may include:  Using an over-the-counter vaginal lubricant before sex.  Using a long-acting vaginal moisturizer. -REPLENS-  Using low-dose vaginal estrogen for moderate to severe symptoms that do not respond to other treatments. Options include creams, tablets, and inserts (vaginal rings). Before you use a vaginal estrogen, tell your health care provider if you have a history of: ? Breast cancer. ? Endometrial cancer. ? Blood clots. If you are not sexually active and your symptoms are very mild, you may not need treatment. Follow these instructions at home: Medicines  Take over-the-counter and prescription medicines only as told by your health care provider. Do not use herbal or alternative medicines unless your health care provider says that you can.  Use over-the-counter creams, lubricants, or moisturizers for dryness only as directed by your health care provider. General instructions  If your atrophic vaginitis is caused by menopause, discuss all of your menopause symptoms and treatment options with your health care provider.  Do not douche.  Do not use products that can make your vagina dry. These include: ? Scented feminine sprays. ? Scented tampons. ? Scented soaps.  Vaginal intercourse can help to improve blood flow and elasticity of vaginal tissue. If it hurts to have sex, try using a lubricant or moisturizer just before having intercourse. Contact a health care provider if:  Your discharge looks different than normal.  Your vagina has an unusual smell.  You have new symptoms.  Your symptoms do not improve with treatment.  Your symptoms get worse. Summary  Atrophic vaginitis is a condition in which the  tissues that line the vagina become dry and thin. It is most common in women who have stopped having regular menstrual periods (are in menopause).  Treatment options include using vaginal lubricants and low-dose vaginal estrogen.  Contact a health care provider if your vagina has an unusual smell, or if your symptoms get worse or do not improve after treatment. This information is not intended to replace advice given to you by your health care provider. Make sure you discuss any questions you have with your health care provider. Document Revised: 11/15/2017 Document Reviewed: 08/29/2017 Elsevier Patient Education  2020 [employer].

## 2020-11-30 NOTE — Progress Notes (Signed)
Obstetrics & Gynecology Office Visit   Chief Complaint  Julie Acevedo presents with  . Dyspareunia   History of Present Illness: 69 y.o. G4P3 presenting for initial evaluation of dyspareunia.  Symptoms onset was several weeks ago, after going 4 years without sexual activity; and symptoms have been stable.  The Julie Acevedo is menopausal.  She is not currently on any HRT or hormonal medications.  Prior PE 1978, so has never taken hormones.  Pain is most pronounced with initial penetration.  Even feels like it wont pass.  Dryness and soreness.  She has been active or tried to be about 10 times. She has had 2 episodes of postcoital spotting. Has used lubrication without success.  She has had prior hysterectomy as well as multiple surgeries for cystocele.  Denies urinary incontinence or problems at this time.   She denies a history of sexually transmitted infections.  Associated symptoms include none.  She denies recent antibiotic exposure, denies changes in soaps, detergents coinciding with the onset of her symptoms.  She has not previously self treated or been under treatment by another provider for these symptoms.   Review of Systems  Constitutional: Negative for chills, fever and malaise/fatigue.  HENT: Negative for congestion, sinus pain and sore throat.   Eyes: Negative for blurred vision and pain.  Respiratory: Negative for cough and wheezing.   Cardiovascular: Negative for chest pain and leg swelling.  Gastrointestinal: Negative for abdominal pain, constipation, diarrhea, heartburn, nausea and vomiting.  Genitourinary: Negative for dysuria, frequency, hematuria and urgency.  Musculoskeletal: Positive for joint pain. Negative for back pain, myalgias and neck pain.  Skin: Negative for itching and rash.  Neurological: Positive for dizziness. Negative for tremors and weakness.  Endo/Heme/Allergies: Does not bruise/bleed easily.  Psychiatric/Behavioral: Negative for depression. The Julie Acevedo is not  nervous/anxious and does not have insomnia.     Past Medical History:  Past Medical History:  Diagnosis Date  . Anemia   . Arthritis   . COVID-19 11/19/2019  . Diverticulitis   . DM (diabetes mellitus) ([place])    typ e 2  . History of methicillin resistant staphylococcus aureus (MRSA) 2017  . HTN (hypertension)   . Hyperlipidemia   . Memory difficulties 09/01/2015  . Multiple thyroid nodules   . Myocardial infarction ([place]) 1995  . Short-term memory loss     Past Surgical History:  Past Surgical History:  Procedure Laterality Date  . ABDOMINAL HYSTERECTOMY     due to endometriosis-1 ovary left  . BREAST EXCISIONAL BIOPSY Right 1971   neg  . CARDIAC CATHETERIZATION  2000   no significant CAD per note of Dr Caryl Comes in 2013   . CHOLECYSTECTOMY    . CYSTOCELE REPAIR N/A 03/19/2017   Procedure: ANTERIOR REPAIR (CYSTOCELE);  Surgeon: Bjorn Loser, MD;  Location: [facility];  Service: Urology;  Laterality: N/A;  . CYSTOSCOPY N/A 03/19/2017   Procedure: CYSTOSCOPY;  Surgeon: Bjorn Loser, MD;  Location: [facility];  Service: Urology;  Laterality: N/A;  . EYE SURGERY    . FOOT SURGERY    . HAND SURGERY    . HEMICOLECTOMY    . INSERTION OF MESH N/A 08/21/2016   Procedure: INSERTION OF MESH;  Surgeon: Excell Seltzer, MD;  Location: [facility];  Service: General;  Laterality: N/A;  . INSERTION OF MESH N/A 12/08/2019   Procedure: INSERTION OF MESH;  Surgeon: Jules Husbands, MD;  Location: [facility];  Service: General;  Laterality: N/A;  . LAPAROSCOPIC LYSIS OF ADHESIONS N/A  08/21/2016   Procedure: LAPAROSCOPIC LYSIS OF ADHESIONS;  Surgeon: Excell Seltzer, MD;  Location: [facility];  Service: General;  Laterality: N/A;  . LAPAROSCOPIC REMOVAL ABDOMINAL MASS    . LAPAROTOMY N/A 04/16/2019   Procedure: EXPLORATORY LAPAROTOMY;  Surgeon: Benjamine Sprague, DO;  Location: [facility];  Service: General;  Laterality: N/A;  . LOOP RECORDER INSERTION N/A 03/21/2018   Procedure: LOOP RECORDER INSERTION;  Surgeon:  Sanda Klein, MD;  Location: [facility];  Service: Cardiovascular;  Laterality: N/A;  . LUMBAR [place]    . ROUX-EN-Y GASTRIC BYPASS  2010   Dr Hassell Done  . TONSILLECTOMY    . UPPER GI ENDOSCOPY  01/12/16   normal larynx, normal esophagus. gastric bypass with a normal-sized pouch and intact staple line, normal examined jejunum, otherwise exam was normal  . VENTRAL HERNIA REPAIR N/A 08/21/2016   Procedure: LAPAROSCOPIC VENTRAL HERNIA;  Surgeon: Excell Seltzer, MD;  Location: [facility];  Service: General;  Laterality: N/A;  . XI ROBOTIC ASSISTED VENTRAL HERNIA N/A 12/08/2019   Procedure: XI ROBOTIC ASSISTED VENTRAL HERNIA;  Surgeon: Jules Husbands, MD;  Location: [facility];  Service: General;  Laterality: N/A;    Gynecologic History: No LMP recorded. Julie Acevedo has had a hysterectomy.  Obstetric History: G4P3  Family History:  Family History  Problem Relation Age of Onset  . Cancer Father 67       Lung  . Coronary artery disease Father 41  . COPD Mother   . Diabetes Mother   . Hypertension Mother   . Cancer Paternal Grandmother        Breast  . Breast cancer Paternal Grandmother   . Congestive Heart Failure Maternal Grandmother   . Emphysema Maternal Grandfather   . Heart attack Paternal Grandfather   . Cancer Paternal Aunt        Breast  . Breast cancer Paternal Aunt 49  . Breast cancer Paternal Aunt 60    Social History:  Social History   Socioeconomic History  . Marital status: Married    Spouse name: Not on file  . Number of children: 3  . Years of education: 25  . Highest education level: Some college, no degree  Occupational History  . Occupation: IT consultant: RETIRED  Tobacco Use  . Smoking status: Never Smoker  . Smokeless tobacco: Never Used  Vaping Use  . Vaping Use: Never used  Substance and Sexual Activity  . Alcohol use: No  . Drug use: No  . Sexual activity: Never  Other Topics Concern  . Not on file  Social History Narrative    Lives with husband, daughter and granddaughter.;   Julie Acevedo drinks about 3 cups of caffeine daily.   Julie Acevedo is left handed.    Social Determinants of Health   Financial Resource Strain: Low Risk   . Difficulty of Paying Living Expenses: Not hard at all  Food Insecurity: No Food Insecurity  . Worried About Charity fundraiser in the Last Year: Never true  . Ran Out of Food in the Last Year: Never true  Transportation Needs: No Transportation Needs  . Lack of Transportation (Medical): No  . Lack of Transportation (Non-Medical): No  Physical Activity: Inactive  . Days of Exercise per Week: 0 days  . Minutes of Exercise per Session: 0 min  Stress: No Stress Concern Present  . Feeling of Stress : Only a little  Social Connections: Moderately Integrated  . Frequency of Communication with Friends  and Family: More than three times a week  . Frequency of Social Gatherings with Friends and Family: More than three times a week  . Attends Religious Services: More than 4 times per year  . Active Member of Clubs or Organizations: No  . Attends Archivist Meetings: Never  . Marital Status: Married  Human resources officer Violence: Not At Risk  . Fear of Current or Ex-Partner: No  . Emotionally Abused: No  . Physically Abused: No  . Sexually Abused: No    Allergies:  Allergies  Allergen Reactions  . Lotrel [Amlodipine Besy-Benazepril Hcl] Anaphylaxis  . Contrast Media [Iodinated Diagnostic Agents] Swelling and Other (See Comments)    Reaction:  Neck swelling   . Niacin Hives  . Sulfa Antibiotics Hives    Medications: Prior to Admission medications   Medication Sig Start Date End Date Taking? Authorizing Provider  cyclobenzaprine (FLEXERIL) 5 MG tablet Take 1 tablet (5 mg total) by mouth 3 (three) times daily as needed. 09/30/20  Yes Jerrol Banana., MD  gabapentin (NEURONTIN) 100 MG capsule Take 100 mg by mouth in the morning, at noon, and at bedtime.    Yes [provider]  hydrochlorothiazide (HYDRODIURIL) 25 MG tablet Take 25 mg by mouth daily.   Yes [provider]  LINZESS 290 MCG CAPS capsule TAKE 1 CAPSULE (290 [place]) BY MOUTH DAILY BEFORE BREAKFAST. 03/16/20  Yes Jerrol Banana., MD  metFORMIN (GLUCOPHAGE) 500 MG tablet TAKE 1 TABLET (500 MG TOTAL) BY MOUTH 2 (TWO) TIMES DAILY WITH A MEAL. Julie Acevedo taking differently: 500 mg daily. 12/05/19  Yes Jerrol Banana., MD  methocarbamol (ROBAXIN) 500 MG tablet Take 500 mg by mouth 2 (two) times daily. Back pain 05/22/19  Yes [provider]  metoprolol succinate (TOPROL-XL) 25 MG 24 hr tablet Take 25 mg by mouth daily. 04/28/20  Yes [provider]  omeprazole (PRILOSEC) 20 MG capsule TAKE 1 CAPSULE BY MOUTH EVERY DAY 11/18/20  Yes Jerrol Banana., MD  ondansetron (ZOFRAN) 4 MG tablet Take 1 tablet (4 mg total) by mouth every 8 (eight) hours as needed. 07/20/20  Yes Pabon, Diego F, MD  traMADol (ULTRAM) 50 MG tablet TAKE 1 TABLET (50 MG TOTAL) BY MOUTH EVERY 6 (SIX) HOURS AS NEEDED. 09/30/20  Yes Jerrol Banana., MD  traZODone (DESYREL) 50 MG tablet TAKE 1 TABLET (50 MG TOTAL) BY MOUTH AT BEDTIME AS NEEDED. Julie Acevedo taking differently: 50 mg at bedtime. 08/17/20  Yes Jerrol Banana., MD  Vitamin D, Ergocalciferol, (DRISDOL) 1.25 MG (50000 UNIT) CAPS capsule TAKE 1 CAPSULE (50,000 UNITS TOTAL) BY MOUTH EVERY 7 (SEVEN) DAYS. 10/12/20  Yes Jerrol Banana., MD  mupirocin ointment Drue Stager) 2 % Apply to wound after soaking BID Julie Acevedo not taking: Reported on 08/19/2020 07/27/20   Tyson Dense T, DPM  Vaginal Lubricant (REPLENS) GEL Place 1 Applicatorful vaginally 3 (three) times a week. 11/30/20   Gae Dry, MD  diphenhydrAMINE (BENADRYL) 50 MG tablet Julie Acevedo is to take one tablet 1 hour before test. Julie Acevedo not taking: Reported on 04/28/2020 04/04/20 05/15/20  Jules Husbands, MD    Physical Exam Blood pressure 100/60, height 5\' 3"  (1.6 m),  weight 131 lb (59.4 kg).  No LMP recorded. Julie Acevedo has had a hysterectomy.  General: NAD HEENT: normocephalic, anicteric Thyroid: no enlargement, no palpable nodules Pulmonary: No increased work of breathing Cardiovascular: RRR, distal pulses 2+ Abdomen: NABS, soft, non-tender, non-distended.  Umbilicus without lesions.  No hepatomegaly, splenomegaly or masses palpable. No evidence of hernia  Genitourinary:  External: Normal external female genitalia.  Normal urethral meatus, normal  Bartholin's and Skene's glands.    Vagina: Normal but atrophic vaginal mucosa, no evidence of prolapse, tender exam, no mass  Cervix:Absent  Uterus: Absent  Adnexa: no adnexal masses  Rectal: deferred  Lymphatic: no evidence of inguinal lymphadenopathy Extremities: no edema, erythema, or tenderness Neurologic: Grossly intact Psychiatric: mood appropriate, affect full  Female chaperone present for pelvic  portions of the physical exam  Assessment: 69 y.o. G4P3 with Dyspareunia  Plan: Problem List Items Addressed This Visit    Visit Diagnoses    Dyspareunia due to medical condition in female    -  Primary   Relevant Medications   Vaginal Lubricant (REPLENS) GEL   Vaginal atrophy       Relevant Medications   Vaginal Lubricant (REPLENS) GEL   Cystocele, midline        Plan Replens to help w moisturization and atrophy Avoid ERT as has h/o PE Consider Gabapentin based on neuropathic nature of pain, if persists after adequate moisturization F/u 2 mos  Barnett Applebaum, MD, [place], [facility] 11/30/2020  9:14 AM

## 2020-12-01 ENCOUNTER — Ambulatory Visit
Admission: RE | Admit: 2020-12-01 | Discharge: 2020-12-01 | Disposition: A | Discharge status: Home / Self Care | Payer: Medicare HMO | Source: Ambulatory Visit | Attending: Family Medicine | Admitting: Family Medicine

## 2020-12-01 DIAGNOSIS — E113393 Type 2 diabetes mellitus with moderate nonproliferative diabetic retinopathy without macular edema, bilateral: Secondary | ICD-10-CM | POA: Diagnosis not present

## 2020-12-01 DIAGNOSIS — Z1231 Encounter for screening mammogram for malignant neoplasm of breast: Secondary | ICD-10-CM | POA: Insufficient documentation

## 2020-12-05 DIAGNOSIS — R4189 Other symptoms and signs involving cognitive functions and awareness: Secondary | ICD-10-CM | POA: Diagnosis not present

## 2020-12-05 DIAGNOSIS — E1169 Type 2 diabetes mellitus with other specified complication: Secondary | ICD-10-CM | POA: Diagnosis not present

## 2020-12-05 DIAGNOSIS — I10 Essential (primary) hypertension: Secondary | ICD-10-CM | POA: Diagnosis not present

## 2020-12-05 DIAGNOSIS — N1832 Chronic kidney disease, stage 3b: Secondary | ICD-10-CM | POA: Diagnosis not present

## 2020-12-05 DIAGNOSIS — E538 Deficiency of other specified B group vitamins: Secondary | ICD-10-CM | POA: Diagnosis not present

## 2020-12-05 DIAGNOSIS — E785 Hyperlipidemia, unspecified: Secondary | ICD-10-CM | POA: Diagnosis not present

## 2020-12-05 DIAGNOSIS — E1122 Type 2 diabetes mellitus with diabetic chronic kidney disease: Secondary | ICD-10-CM | POA: Diagnosis not present

## 2020-12-06 DIAGNOSIS — H16223 Keratoconjunctivitis sicca, not specified as Sjogren's, bilateral: Secondary | ICD-10-CM | POA: Diagnosis not present

## 2020-12-06 LAB — COMPREHENSIVE METABOLIC PANEL
ALT: 6 IU/L (ref 0–32)
AST: 15 IU/L (ref 0–40)
Albumin/Globulin Ratio: 1.5 (ref 1.2–2.2)
Albumin: 3.8 g/dL (ref 3.8–4.8)
Alkaline Phosphatase: 191 IU/L — ABNORMAL HIGH (ref 44–121)
BUN/Creatinine Ratio: 11 — ABNORMAL LOW (ref 12–28)
BUN: 17 mg/dL (ref 8–27)
Bilirubin Total: 0.4 mg/dL (ref 0.0–1.2)
CO2: 25 mmol/L (ref 20–29)
Calcium: 9.1 mg/dL (ref 8.7–10.3)
Chloride: 103 mmol/L (ref 96–106)
Creatinine, Ser: 1.48 mg/dL — ABNORMAL HIGH (ref 0.57–1.00)
GFR calc Af Amer: 42 mL/min/{1.73_m2} — ABNORMAL LOW (ref >59)
GFR calc non Af Amer: 36 mL/min/{1.73_m2} — ABNORMAL LOW (ref >59)
Globulin, Total: 2.6 g/dL (ref 1.5–4.5)
Glucose: 83 mg/dL (ref 65–99)
Potassium: 3.4 mmol/L — ABNORMAL LOW (ref 3.5–5.2)
Sodium: 141 mmol/L (ref 134–144)
Total Protein: 6.4 g/dL (ref 6.0–8.5)

## 2020-12-06 LAB — CBC WITH DIFFERENTIAL/PLATELET
Basophils Absolute: 0 10*3/uL (ref 0.0–0.2)
Basos: 0 %
EOS (ABSOLUTE): 0.2 10*3/uL (ref 0.0–0.4)
Eos: 3 %
Hematocrit: 34.1 % (ref 34.0–46.6)
Hemoglobin: 11 g/dL — ABNORMAL LOW (ref 11.1–15.9)
Immature Grans (Abs): 0 10*3/uL (ref 0.0–0.1)
Immature Granulocytes: 0 %
Lymphocytes Absolute: 2.4 10*3/uL (ref 0.7–3.1)
Lymphs: 34 %
MCH: 29.4 pg (ref 26.6–33.0)
MCHC: 32.3 g/dL (ref 31.5–35.7)
MCV: 91 fL (ref 79–97)
Monocytes Absolute: 0.6 10*3/uL (ref 0.1–0.9)
Monocytes: 8 %
Neutrophils Absolute: 3.9 10*3/uL (ref 1.4–7.0)
Neutrophils: 55 %
Platelets: 346 10*3/uL (ref 150–450)
RBC: 3.74 x10E6/uL — ABNORMAL LOW (ref 3.77–5.28)
RDW: 14.3 % (ref 11.7–15.4)
WBC: 7 10*3/uL (ref 3.4–10.8)

## 2020-12-06 LAB — HIV ANTIBODY (ROUTINE TESTING W REFLEX): HIV Screen 4th Generation wRfx: NONREACTIVE

## 2020-12-06 LAB — LIPID PANEL
Chol/HDL Ratio: 2.7 ratio (ref 0.0–4.4)
Cholesterol, Total: 163 mg/dL (ref 100–199)
HDL: 61 mg/dL (ref >39)
LDL Chol Calc (NIH): 86 mg/dL (ref 0–99)
Triglycerides: 84 mg/dL (ref 0–149)
VLDL Cholesterol Cal: 16 mg/dL (ref 5–40)

## 2020-12-06 LAB — HEMOGLOBIN A1C
Est. average glucose Bld gHb Est-mCnc: 114 mg/dL
Hgb A1c MFr Bld: 5.6 % (ref 4.8–5.6)

## 2020-12-06 LAB — VITAMIN B12: Vitamin B-12: 263 pg/mL (ref 232–1245)

## 2020-12-06 LAB — TSH: TSH: 3.73 u[IU]/mL (ref 0.450–4.500)

## 2020-12-06 LAB — FOLATE: Folate: 2.7 ng/mL — ABNORMAL LOW (ref >3.0)

## 2020-12-06 LAB — RPR: RPR Ser Ql: NONREACTIVE

## 2020-12-11 LAB — CUP PACEART REMOTE DEVICE CHECK
Date Time Interrogation Session: 20211224233312
Implantable Pulse Generator Implant Date: 20190405

## 2020-12-12 ENCOUNTER — Ambulatory Visit (INDEPENDENT_AMBULATORY_CARE_PROVIDER_SITE_OTHER): Payer: Medicare HMO

## 2020-12-12 DIAGNOSIS — R55 Syncope and collapse: Secondary | ICD-10-CM

## 2020-12-14 NOTE — Progress Notes (Addendum)
Established patient visit   Patient: Julie Acevedo   DOB: 12-30-1950   69 y.o. Female  MRN: [id number] Visit Date: 12/15/2020  Today's healthcare provider: Marcille Buffy, FNP   Chief Complaint  Patient presents with  . Back Pain   Subjective    HPI HPI    Back Pain    This is a new problem.  There was not an injury that may have caused the pain.  Recent episode started in the past 7 days.  Pain is lumbar spine.  The quality of pain is described as aching intermittent right sided flank pain. Reports history of past cholecystomy.  .  The symptoms are aggravated by bending, lying down and position.  Treatments: prescription pain relievers.  Treatment provided moderate relief. Denies constipation.  Abdominal Pain: Absent.  Bowel incontinence: Absent.  Chest pain: Absent.  Dysuria: Absent.  Fever: Absent.  Headaches: Absent.  Joint pains: Present.  Weakness in leg: Present.  Pelvic pain: Absent.  Tingling in lower extremities: Absent.  Urinary incontinence: Present.  Weight loss: Absent.       Last edited by Minette Headland, CMA on 12/15/2020 11:23 AM. (History)      No injury to back. Has some urinary frequency.  E coli urine infection 3 months ago treated with nitrofuranton x 3 days.  Denies any blood in stool or change in bowel habits.  Patient  denies any fever, body aches,chills, rash, chest pain, shortness of breath, nausea, vomiting, or diarrhea.  Denies dizziness, lightheadedness, pre syncopal or syncopal episodes.    Patient Active Problem List   Diagnosis Date Noted  . Acute flank pain 12/15/2020  . Cystitis 12/15/2020  . Urinary symptom or sign 12/15/2020  . Low back pain radiating to right lower extremity 06/23/2020  . Gastroesophageal reflux disease 04/28/2020  . Large bowel obstruction ([place]) 04/17/2019  . Acquired trigger finger 03/20/2019  . Iron deficiency anemia 11/21/2018  . Syncope 03/18/2018  . Anemia 03/18/2018  . Exophthalmos 02/22/2018   . Multiple thyroid nodules 02/22/2018  . 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 Pain at surgical site 08/30/2016  . Ventral incisional hernia 08/21/2016  . SBO (small bowel obstruction) ([place])   . Partial small bowel obstruction ([place]) 04/23/2016  . Adjustment disorder with mixed anxiety and depressed mood 04/23/2016  . Memory difficulties 09/01/2015  . Arthritis 06/11/2015  . Back pain, chronic 06/11/2015  . HLD (hyperlipidemia) 06/11/2015  . Cannot sleep 06/11/2015  . L-S radiculopathy 06/11/2015  . Arthritis of knee, degenerative 06/11/2015  . B12 deficiency 06/11/2015  . 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 DM2 (diabetes mellitus, type 2) ([place]) 11/15/2012  . Adiposity 11/21/2009  . Avitaminosis D 11/07/2009  . Narrowing of intervertebral disc space 07/25/2009  . Benign essential HTN 07/25/2009   Past Medical History:  Diagnosis Date  . Anemia   . Arthritis   . COVID-19 11/19/2019  . Diverticulitis   . DM (diabetes mellitus) ([place])    typ e 2  . History of methicillin resistant staphylococcus aureus (MRSA) 2017  . HTN (hypertension)   . Hyperlipidemia   . Memory difficulties 09/01/2015  . Multiple thyroid nodules   . Myocardial infarction ([place]) 1995  . Short-term memory loss    Allergies  Allergen Reactions  . Lotrel [Amlodipine Besy-Benazepril Hcl] Anaphylaxis  . Contrast Media [Iodinated Diagnostic Agents] Swelling and Other (See Comments)    Reaction:  Neck swelling   .  Niacin Hives  . Sulfa Antibiotics Hives       Medications: Outpatient Medications Prior to Visit  Medication Sig  . cyclobenzaprine (FLEXERIL) 5 MG tablet Take 1 tablet (5 mg total) by mouth 3 (three) times daily as needed.  . gabapentin (NEURONTIN) 100 MG capsule Take 100 mg by mouth in the morning, at noon, and at bedtime.   . hydrochlorothiazide (HYDRODIURIL) 25 MG tablet Take 25 mg by mouth daily.  Marland Kitchen LINZESS 290 MCG CAPS  capsule TAKE 1 CAPSULE (290 MCG TOTAL) BY MOUTH DAILY BEFORE BREAKFAST.  . metFORMIN (GLUCOPHAGE) 500 MG tablet TAKE 1 TABLET (500 MG TOTAL) BY MOUTH 2 (TWO) TIMES DAILY WITH A MEAL. (Patient taking differently: 500 mg daily.)  . methocarbamol (ROBAXIN) 500 MG tablet Take 500 mg by mouth 2 (two) times daily. Back pain  . metoprolol succinate (TOPROL-XL) 25 MG 24 hr tablet Take 25 mg by mouth daily.  Marland Kitchen omeprazole (PRILOSEC) 20 MG capsule TAKE 1 CAPSULE BY MOUTH EVERY DAY  . ondansetron (ZOFRAN) 4 MG tablet Take 1 tablet (4 mg total) by mouth every 8 (eight) hours as needed.  . traMADol (ULTRAM) 50 MG tablet TAKE 1 TABLET (50 MG TOTAL) BY MOUTH EVERY 6 (SIX) HOURS AS NEEDED.  Marland Kitchen traZODone (DESYREL) 50 MG tablet TAKE 1 TABLET (50 MG TOTAL) BY MOUTH AT BEDTIME AS NEEDED. (Patient taking differently: 50 mg at bedtime.)  . Vaginal Lubricant (REPLENS) GEL Place 1 Applicatorful vaginally 3 (three) times a week.  . Vitamin D, Ergocalciferol, (DRISDOL) 1.25 MG (50000 UNIT) CAPS capsule TAKE 1 CAPSULE (50,000 UNITS TOTAL) BY MOUTH EVERY 7 (SEVEN) DAYS.   No facility-administered medications prior to visit.    Review of Systems  Constitutional: Negative.   HENT: Negative.   Respiratory: Negative.   Cardiovascular: Negative.   Gastrointestinal: Positive for abdominal pain. Negative for abdominal distention, blood in stool, constipation, diarrhea and nausea.  Genitourinary: Positive for frequency.  Musculoskeletal: Positive for arthralgias and back pain (right side flank pain ).  Skin: Negative.   Neurological: Negative.   Psychiatric/Behavioral: Negative.       Objective    BP (!) 103/58   Pulse 66   Temp 98.6 F (37 C) (Oral)   Resp 14   Wt 133 lb 9.6 oz (60.6 kg)   BMI 23.67 kg/m    Physical Exam Vitals reviewed.  Constitutional:      General: She is not in acute distress.    Appearance: She is obese. She is not ill-appearing, toxic-appearing or diaphoretic.     Comments: Patient  appers well, not sickly. Speaking in complete sentences. Patient moves on and off of exam table and in room without difficulty. Gait is normal in hall and in room. Patient is oriented to person place time and situation. Patient answers questions appropriately and engages eye contact and verbal dialect with provider.   HENT:     Head: Normocephalic and atraumatic.     Right Ear: External ear normal.     Left Ear: External ear normal.     Nose: Nose normal.     Mouth/Throat:     Mouth: Mucous membranes are moist.     Pharynx: No posterior oropharyngeal erythema.  Eyes:     General: No scleral icterus.       Right eye: No discharge.        Left eye: No discharge.     Conjunctiva/sclera: Conjunctivae normal.     Pupils: Pupils are equal, round, and reactive to  light.  Cardiovascular:     Rate and Rhythm: Normal rate and regular rhythm.     Pulses: Normal pulses.     Heart sounds: Normal heart sounds.  Pulmonary:     Effort: Pulmonary effort is normal. No respiratory distress.     Breath sounds: Normal breath sounds. No stridor. No wheezing, rhonchi or rales.  Chest:     Chest wall: No tenderness.  Abdominal:     General: Bowel sounds are normal. There is no distension.     Palpations: Abdomen is soft. There is no mass.     Tenderness: There is abdominal tenderness in the suprapubic area. There is no right CVA tenderness, left CVA tenderness, guarding or rebound. Negative signs include Murphy's sign, Rovsing's sign and McBurney's sign.     Hernia: No hernia is present.    Musculoskeletal:        General: Normal range of motion.     Cervical back: Normal range of motion and neck supple.  Skin:    General: Skin is warm.     Capillary Refill: Capillary refill takes less than 2 seconds.  Neurological:     General: No focal deficit present.     Mental Status: She is alert and oriented to person, place, and time.  Psychiatric:        Mood and Affect: Mood normal.        Behavior:  Behavior normal.        Thought Content: Thought content normal.        Judgment: Judgment normal.      Results for orders placed or performed in visit on 12/15/20  POCT urinalysis dipstick  Result Value Ref Range   Color, UA dark yellow    Clarity, UA cloudy    Glucose, UA Negative Negative   Bilirubin, UA moderate    Ketones, UA trace    Spec Grav, UA 1.010 1.010 - 1.025   Blood, UA non hemolyzed moderate    pH, UA 6.0 5.0 - 8.0   Protein, UA Positive (A) Negative   Urobilinogen, UA 2.0 (A) 0.2 or 1.0 E.U./dL   Nitrite, UA positive    Leukocytes, UA Small (1+) (A) Negative   Appearance     Odor      Assessment & Plan    Urinary symptom or sign - Plan: POCT urinalysis dipstick, DG Abd 1 View  Cystitis - Plan: Urine Culture  Acute flank pain - Plan: DG Abd 1 View, CBC with Differential/Platelet    Meds ordered this encounter  Medications  . cephALEXin (KEFLEX) 500 MG capsule    Sig: Take 1 capsule (500 mg total) by mouth 2 (two) times daily.    Dispense:  14 capsule    Refill:  0  will treat given urine results for UTI, and previous 3 months ago, no CVA tenderness, KUB to rule out calcium stone.  No bowel symptoms. Will use keflex over Augmentin at this time due to kidney function.    Red Flags discussed. The patient was given clear instructions to go to ER or return to medical center if any red flags develop, symptoms do not improve, worsen or new problems develop. They verbalized understanding.  Return in about 2 weeks (around 12/29/2020), or if symptoms worsen or fail to improve, for at any time for any worsening symptoms, Go to Emergency room/ urgent care if worse.     Orders Placed This Encounter  Procedures  . Urine Culture  . DG Abd  1 View  . CBC with Differential/Platelet  . POCT urinalysis dipstick   The entirety of the information documented in the History of Present Illness, Review of Systems and Physical Exam were personally obtained by me. Portions  of this information were initially documented by the CMA and reviewed by me for thoroughness and accuracy.      Marcille Buffy, [place] [phone] (phone) [phone] (fax)  [place]

## 2020-12-15 ENCOUNTER — Other Ambulatory Visit: Payer: Self-pay

## 2020-12-15 ENCOUNTER — Ambulatory Visit
Admission: RE | Admit: 2020-12-15 | Discharge: 2020-12-15 | Disposition: A | Discharge status: Home / Self Care | Payer: Medicare HMO | Attending: Adult Health | Admitting: Adult Health

## 2020-12-15 ENCOUNTER — Ambulatory Visit (INDEPENDENT_AMBULATORY_CARE_PROVIDER_SITE_OTHER): Payer: Medicare HMO | Admitting: Adult Health

## 2020-12-15 ENCOUNTER — Encounter: Payer: Self-pay | Admitting: Adult Health

## 2020-12-15 ENCOUNTER — Ambulatory Visit
Admission: RE | Admit: 2020-12-15 | Discharge: 2020-12-15 | Disposition: A | Discharge status: Home / Self Care | Payer: Medicare HMO | Source: Ambulatory Visit | Attending: Adult Health | Admitting: Adult Health

## 2020-12-15 VITALS — BP 103/58 | HR 66 | Temp 98.6°F | Resp 14 | Wt 133.6 lb

## 2020-12-15 DIAGNOSIS — R109 Unspecified abdominal pain: Secondary | ICD-10-CM | POA: Insufficient documentation

## 2020-12-15 DIAGNOSIS — R399 Unspecified symptoms and signs involving the genitourinary system: Secondary | ICD-10-CM | POA: Diagnosis not present

## 2020-12-15 DIAGNOSIS — N3090 Cystitis, unspecified without hematuria: Secondary | ICD-10-CM

## 2020-12-15 LAB — POCT URINALYSIS DIPSTICK
Glucose, UA: NEGATIVE
Nitrite, UA: POSITIVE
Protein, UA: POSITIVE — AB
Spec Grav, UA: 1.01 (ref 1.010–1.025)
Urobilinogen, UA: 2 E.U./dL — AB
pH, UA: 6 (ref 5.0–8.0)

## 2020-12-15 MED ORDER — CEPHALEXIN 500 MG PO CAPS: 500.0000 mg | ORAL_CAPSULE | Freq: Two times a day (BID) | ORAL | 0 refills | Status: DC

## 2020-12-15 NOTE — Patient Instructions (Signed)
Cephalexin Tablets or Capsules What is this medicine? CEPHALEXIN (sef a LEX in) is a cephalosporin antibiotic. It treats some infections caused by bacteria. It will not work for colds, the flu, or other viruses. This medicine may be used for other purposes; ask your health care provider or pharmacist if you have questions. COMMON BRAND NAME(S): Biocef, Daxbia, Keflex, Keftab What should I tell my health care provider before I take this medicine? They need to know if you have any of these conditions:  kidney disease  stomach or intestine problems, especially colitis  an unusual or allergic reaction to cephalexin, other cephalosporins, penicillins, other antibiotics, medicines, foods, dyes or preservatives  pregnant or trying to get pregnant  breast-feeding How should I use this medicine? Take this drug by mouth. Take it as directed on the prescription label at the same time every day. You can take it with or without food. If it upsets your stomach, take it with food. Take all of this drug unless your health care provider tells you to stop it early. Keep taking it even if you think you are better. Talk to your health care provider about the use of this drug in children. While it may be prescribed for selected conditions, precautions do apply. Overdosage: If you think you have taken too much of this medicine contact a poison control center or emergency room at once. NOTE: This medicine is only for you. Do not share this medicine with others. What if I miss a dose? If you miss a dose, take it as soon as you can. If it is almost time for your next dose, take only that dose. Do not take double or extra doses. What may interact with this medicine?  probenecid  some other antibiotics This list may not describe all possible interactions. Give your health care provider a list of all the medicines, herbs, non-prescription drugs, or dietary supplements you use. Also tell them if you smoke, drink  alcohol, or use illegal drugs. Some items may interact with your medicine. What should I watch for while using this medicine? Tell your doctor or health care provider if your symptoms do not begin to improve in a few days. This medicine may cause serious skin reactions. They can happen weeks to months after starting the medicine. Contact your health care provider right away if you notice fevers or flu-like symptoms with a rash. The rash may be red or purple and then turn into blisters or peeling of the skin. Or, you might notice a red rash with swelling of the face, lips or lymph nodes in your neck or under your arms. Do not treat diarrhea with over the counter products. Contact your doctor if you have diarrhea that lasts more than 2 days or if it is severe and watery. If you have diabetes, you may get a false-positive result for sugar in your urine. Check with your doctor or health care provider. What side effects may I notice from receiving this medicine? Side effects that you should report to your doctor or health care professional as soon as possible:  allergic reactions like skin rash, itching or hives, swelling of the face, lips, or tongue  breathing problems  pain or trouble passing urine  redness, blistering, peeling or loosening of the skin, including inside the mouth  severe or watery diarrhea  unusually weak or tired  yellowing of the eyes, skin Side effects that usually do not require medical attention (report to your doctor or health care professional   if they continue or are bothersome):  gas or heartburn  genital or anal irritation  headache  joint or muscle pain  nausea, vomiting This list may not describe all possible side effects. Call your doctor for medical advice about side effects. You may report side effects to FDA at 1-800-FDA-1088. Where should I keep my medicine? Keep out of the reach of children and pets. Store at room temperature between 20 and 25  degrees C (68 and 77 degrees F). Throw away any unused drug after the expiration date. NOTE: This sheet is a summary. It may not cover all possible information. If you have questions about this medicine, talk to your doctor, pharmacist, or health care provider.  2020 Elsevier/Gold Standard (2019-07-10 11:27:00) Urinary Tract Infection, Adult A urinary tract infection (UTI) is an infection of any part of the urinary tract. The urinary tract includes:  The kidneys.  The ureters.  The bladder.  The urethra. These organs make, store, and get rid of pee (urine) in the body. What are the causes? This is caused by germs (bacteria) in your genital area. These germs grow and cause swelling (inflammation) of your urinary tract. What increases the risk? You are more likely to develop this condition if:  You have a small, thin tube (catheter) to drain pee.  You cannot control when you pee or poop (incontinence).  You are female, and: ? You use these methods to prevent pregnancy:  A medicine that kills sperm (spermicide).  A device that blocks sperm (diaphragm). ? You have low levels of a female hormone (estrogen). ? You are pregnant.  You have genes that add to your risk.  You are sexually active.  You take antibiotic medicines.  You have trouble peeing because of: ? A prostate that is bigger than normal, if you are female. ? A blockage in the part of your body that drains pee from the bladder (urethra). ? A kidney stone. ? A nerve condition that affects your bladder (neurogenic bladder). ? Not getting enough to drink. ? Not peeing often enough.  You have other conditions, such as: ? Diabetes. ? A weak disease-fighting system (immune system). ? Sickle cell disease. ? Gout. ? Injury of the spine. What are the signs or symptoms? Symptoms of this condition include:  Needing to pee right away (urgently).  Peeing often.  Peeing small amounts often.  Pain or burning when  peeing.  Blood in the pee.  Pee that smells bad or not like normal.  Trouble peeing.  Pee that is cloudy.  Fluid coming from the vagina, if you are female.  Pain in the belly or lower back. Other symptoms include:  Throwing up (vomiting).  No urge to eat.  Feeling mixed up (confused).  Being tired and grouchy (irritable).  A fever.  Watery poop (diarrhea). How is this treated? This condition may be treated with:  Antibiotic medicine.  Other medicines.  Drinking enough water. Follow these instructions at home:  Medicines  Take over-the-counter and prescription medicines only as told by your doctor.  If you were prescribed an antibiotic medicine, take it as told by your doctor. Do not stop taking it even if you start to feel better. General instructions  Make sure you: ? Pee until your bladder is empty. ? Do not hold pee for a long time. ? Empty your bladder after sex. ? Wipe from front to back after pooping if you are a female. Use each tissue one time when you wipe.    Drink enough fluid to keep your pee pale yellow.  Keep all follow-up visits as told by your doctor. This is important. Contact a doctor if:  You do not get better after 1-2 days.  Your symptoms go away and then come back. Get help right away if:  You have very bad back pain.  You have very bad pain in your lower belly.  You have a fever.  You are sick to your stomach (nauseous).  You are throwing up. Summary  A urinary tract infection (UTI) is an infection of any part of the urinary tract.  This condition is caused by germs in your genital area.  There are many risk factors for a UTI. These include having a small, thin tube to drain pee and not being able to control when you pee or poop.  Treatment includes antibiotic medicines for germs.  Drink enough fluid to keep your pee pale yellow. This information is not intended to replace advice given to you by your health care  provider. Make sure you discuss any questions you have with your health care provider. Document Revised: 11/20/2018 Document Reviewed: 06/12/2018 Elsevier Patient Education  2020 Elsevier Inc.  

## 2020-12-15 NOTE — Progress Notes (Signed)
No renal stone.  Seek care immediately if any symptoms worsen over the holiday weekend.

## 2020-12-18 ENCOUNTER — Other Ambulatory Visit: Payer: Self-pay | Admitting: Family Medicine

## 2020-12-18 DIAGNOSIS — M545 Low back pain, unspecified: Secondary | ICD-10-CM

## 2020-12-19 LAB — URINE CULTURE

## 2020-12-20 NOTE — Progress Notes (Signed)
Keflex should cover, she is on it for 7 days. Return to office if any symptoms persist or worsen at anytime.

## 2020-12-26 NOTE — Progress Notes (Signed)
[facility] Summary Report / Loop Recorder 

## 2020-12-27 DIAGNOSIS — H16223 Keratoconjunctivitis sicca, not specified as Sjogren's, bilateral: Secondary | ICD-10-CM | POA: Diagnosis not present

## 2021-01-02 ENCOUNTER — Ambulatory Visit: Payer: Medicare HMO | Admitting: Podiatry

## 2021-01-03 ENCOUNTER — Ambulatory Visit: Payer: Medicare HMO | Admitting: Adult Health

## 2021-01-05 NOTE — Progress Notes (Deleted)
      Established patient visit   Patient: Julie Acevedo   DOB: Sep 24, 1951   [name]  MRN: [id number] Visit Date: 01/09/2021  Today's healthcare provider: [name], MD   No chief complaint on file.  Subjective    HPI  Urinary Tract Infection From 12/15/2020-seen by Laverna Peace. Was given cephalexin 500 mg bid x7 days.  {Show patient history (optional):23778::" "}   Medications: Outpatient Medications Prior to Visit  Medication Sig  . cephALEXin (KEFLEX) 500 MG capsule Take 1 capsule (500 mg total) by mouth 2 (two) times daily.  . cyclobenzaprine (FLEXERIL) 5 MG tablet TAKE 1 TABLET BY MOUTH 3 TIMES DAILY AS NEEDED.  Marland Kitchen gabapentin (NEURONTIN) 100 MG capsule Take 100 mg by mouth in the morning, at noon, and at bedtime.   . hydrochlorothiazide (HYDRODIURIL) 25 MG tablet Take 25 mg by mouth daily.  Marland Kitchen LINZESS 290 MCG CAPS capsule TAKE 1 CAPSULE (290 MCG TOTAL) BY MOUTH DAILY BEFORE BREAKFAST.  . metFORMIN (GLUCOPHAGE) 500 MG tablet TAKE 1 TABLET (500 MG TOTAL) BY MOUTH 2 (TWO) TIMES DAILY WITH A MEAL. (Patient taking differently: 500 mg daily.)  . methocarbamol (ROBAXIN) 500 MG tablet Take 500 mg by mouth 2 (two) times daily. Back pain  . metoprolol succinate (TOPROL-XL) 25 MG 24 hr tablet Take 25 mg by mouth daily.  Marland Kitchen omeprazole (PRILOSEC) 20 MG capsule TAKE 1 CAPSULE BY MOUTH EVERY DAY  . ondansetron (ZOFRAN) 4 MG tablet Take 1 tablet (4 mg total) by mouth every 8 (eight) hours as needed.  . traMADol (ULTRAM) 50 MG tablet TAKE 1 TABLET (50 MG TOTAL) BY MOUTH EVERY 6 (SIX) HOURS AS NEEDED.  Marland Kitchen traZODone (DESYREL) 50 MG tablet TAKE 1 TABLET (50 MG TOTAL) BY MOUTH AT BEDTIME AS NEEDED. (Patient taking differently: 50 mg at bedtime.)  . Vaginal Lubricant (REPLENS) GEL Place 1 Applicatorful vaginally 3 (three) times a week.  . Vitamin D, Ergocalciferol, (DRISDOL) 1.25 MG (50000 UNIT) CAPS capsule TAKE 1 CAPSULE (50,000 UNITS TOTAL) BY MOUTH EVERY 7 (SEVEN) DAYS.   No  facility-administered medications prior to visit.    Review of Systems  Constitutional: Negative for appetite change, chills, fatigue and fever.  Respiratory: Negative for chest tightness and shortness of breath.   Cardiovascular: Negative for chest pain and palpitations.  Gastrointestinal: Negative for abdominal pain, nausea and vomiting.  Neurological: Negative for dizziness and weakness.    {Labs  Heme  Chem  Endocrine  Serology  Results Review (optional):23779::" "}   Objective    There were no vitals taken for this visit. {Show previous vital signs (optional):23777::" "}   Physical Exam  ***  No results found for any visits on 01/09/21.  Assessment & Plan     ***  No follow-ups on file.      {provider attestation***:1}   [name], MD  [facility] [phone] (phone) [phone] (fax)  [place]

## 2021-01-09 ENCOUNTER — Ambulatory Visit: Payer: Medicare HMO | Admitting: Family Medicine

## 2021-01-09 ENCOUNTER — Other Ambulatory Visit: Payer: Self-pay | Admitting: Family Medicine

## 2021-01-10 ENCOUNTER — Ambulatory Visit (INDEPENDENT_AMBULATORY_CARE_PROVIDER_SITE_OTHER): Payer: Medicare HMO

## 2021-01-10 DIAGNOSIS — R55 Syncope and collapse: Secondary | ICD-10-CM

## 2021-01-11 ENCOUNTER — Other Ambulatory Visit: Payer: Self-pay | Admitting: Family Medicine

## 2021-01-11 DIAGNOSIS — M5417 Radiculopathy, lumbosacral region: Secondary | ICD-10-CM

## 2021-01-11 NOTE — Telephone Encounter (Signed)
Please for refill. Last refilled on 09/30/20 #100 with 2 refills.  Refill not delegated per protocol.

## 2021-01-11 NOTE — Telephone Encounter (Signed)
Medication Refill - Medication: traMADol (ULTRAM) 50 MG tablet   Has the patient contacted their pharmacy? Yes.    (Agent: If yes, when and what did the pharmacy advise?) Advised patient to call PCP office directly  Preferred Pharmacy (with phone number or street name):  [id number] [place] [address] Phone:  [phone]  Fax:  [phone]       Agent: Please be advised that RX refills may take up to 3 business days. We ask that you follow-up with your pharmacy.

## 2021-01-12 ENCOUNTER — Telehealth: Payer: Self-pay

## 2021-01-12 LAB — CUP PACEART REMOTE DEVICE CHECK
Date Time Interrogation Session: 20220126233918
Implantable Pulse Generator Implant Date: 20190405

## 2021-01-12 NOTE — Telephone Encounter (Signed)
Im ok authorizing refill

## 2021-01-12 NOTE — Telephone Encounter (Signed)
Received fax from [facility] requesting refill on Ondanestron HCL4 mg Tablet #20  Please advise.

## 2021-01-13 MED ORDER — TRAMADOL HCL 50 MG PO TABS: 50.0000 mg | ORAL_TABLET | Freq: Four times a day (QID) | ORAL | 2 refills | Status: DC | PRN

## 2021-01-17 ENCOUNTER — Other Ambulatory Visit: Payer: Self-pay

## 2021-01-17 MED ORDER — ONDANSETRON HCL 4 MG PO TABS: 4.0000 mg | ORAL_TABLET | Freq: Three times a day (TID) | ORAL | 0 refills | Status: DC | PRN

## 2021-01-18 ENCOUNTER — Ambulatory Visit: Payer: Medicare HMO | Admitting: Podiatry

## 2021-01-18 DIAGNOSIS — Z20822 Contact with and (suspected) exposure to covid-19: Secondary | ICD-10-CM | POA: Diagnosis not present

## 2021-01-19 ENCOUNTER — Encounter: Payer: Self-pay | Admitting: Podiatry

## 2021-01-19 ENCOUNTER — Other Ambulatory Visit: Payer: Self-pay

## 2021-01-19 ENCOUNTER — Ambulatory Visit (INDEPENDENT_AMBULATORY_CARE_PROVIDER_SITE_OTHER): Payer: Medicare HMO | Admitting: Podiatry

## 2021-01-19 DIAGNOSIS — M76829 Posterior tibial tendinitis, unspecified leg: Secondary | ICD-10-CM

## 2021-01-19 DIAGNOSIS — K219 Gastro-esophageal reflux disease without esophagitis: Secondary | ICD-10-CM | POA: Diagnosis not present

## 2021-01-19 DIAGNOSIS — L84 Corns and callosities: Secondary | ICD-10-CM | POA: Diagnosis not present

## 2021-01-19 DIAGNOSIS — M2141 Flat foot [pes planus] (acquired), right foot: Secondary | ICD-10-CM | POA: Diagnosis not present

## 2021-01-19 DIAGNOSIS — M2142 Flat foot [pes planus] (acquired), left foot: Secondary | ICD-10-CM | POA: Diagnosis not present

## 2021-01-19 DIAGNOSIS — R490 Dysphonia: Secondary | ICD-10-CM | POA: Diagnosis not present

## 2021-01-19 DIAGNOSIS — M2140 Flat foot [pes planus] (acquired), unspecified foot: Secondary | ICD-10-CM | POA: Insufficient documentation

## 2021-01-19 NOTE — Progress Notes (Signed)
This patient presents to the office stating she is having pain through both feet when she is walking.  She says the pain has been worsening the last few months.  She points to her forefoot as the site of pain.  She has previous history of foot surgery previously.  She presents to the office for evaluation and treatment.  Vascular  Dorsalis pedis and posterior tibial pulses are palpable  B/L.  Capillary return  WNL.  Temperature gradient is  WNL.  Skin turgor  WNL  Sensorium  Senn Weinstein monofilament wire  WNL. Normal tactile sensation.  Nail Exam  Patient has normal nails with no evidence of bacterial or fungal infection.  Orthopedic  Exam  Muscle tone and muscle strength  WNL.  No limitations of motion feet  B/L.  No crepitus or joint effusion noted.  Foot type is unremarkable and digits show no abnormalities.  PTTD  Left greater than right.  Arthritic changes medial aspect left foot greater than right foot.  Severe pes planus  B/L.  Palpable pain sub 1,5  B/L.  Skin  No open lesions.  Normal skin texture and turgor.  Callus sub 5  B/L.  Pes planus  PTTD  Rearfoot  DJD  B/L.  IE.   Told patient that her flatfeet are causing her pain and discomfort.  She needs to be evaluated by podiatric medical podiatrists in the practice.  Dispense powerstep insoles and wear thick soled shoes and call in 2 weeks for an appointment if the feet remain painful.   Gardiner Barefoot DPM

## 2021-01-20 NOTE — Progress Notes (Signed)
[facility] Summary Report / Loop Recorder 

## 2021-01-21 ENCOUNTER — Other Ambulatory Visit: Payer: Self-pay | Admitting: Family Medicine

## 2021-01-21 DIAGNOSIS — M5416 Radiculopathy, lumbar region: Secondary | ICD-10-CM

## 2021-01-21 NOTE — Telephone Encounter (Signed)
Requested medication (s) are due for refill today: yes  Requested medication (s) are on the active medication list: yes  Last refill:  previously filled by historical provider  Future visit scheduled: no  Notes to clinic:  Please review for refill, last filled by historical provider    Requested Prescriptions  Pending Prescriptions Disp Refills   gabapentin (NEURONTIN) 100 MG capsule [Pharmacy Med Name: GABAPENTIN 100 MG CAPSULE] 540 capsule 1    Sig: TAKE 2 CAPSULES (200 MG TOTAL) BY MOUTH 3 (THREE) TIMES DAILY.      Neurology: Anticonvulsants - gabapentin Passed - 01/21/2021  5:36 PM      Passed - Valid encounter within last 12 months    Recent Outpatient Visits           1 month ago Urinary symptom or sign   [place], FNP   1 month ago Cognitive impairment   [facility] Jerrol Banana., MD   4 months ago Low back pain radiating to right lower extremity   [facility] Jerrol Banana., MD   5 months ago Annual physical exam   [facility] Jerrol Banana., MD   6 months ago Diabetic infection of left foot [facility])   [facility] Jerrol Banana., MD       Future Appointments             In 1 month Jerrol Banana., MD [facility] [place]

## 2021-02-01 ENCOUNTER — Telehealth: Payer: Self-pay

## 2021-02-01 NOTE — Progress Notes (Signed)
Chronic Care Management Pharmacy Assistant   Name: AMERA BANOS  MRN: [id number] DOB: 01/13/1951  Reason for Encounter:Diabetes Disease State Call.  Patient Questions:  1.  Have you seen any other providers since your last visit? Yes, 11/30/2020 PCP Richard Gilbert,12/15/2020 PCP Sharyn Lull Finchum 01/19/2021 Podiatry [facility] Barefoot  2.  Any changes in your medicines or health? No  PCP : Jerrol Banana., MD  Allergies:   Allergies  Allergen Reactions  . Lotrel [Amlodipine Besy-Benazepril Hcl] Anaphylaxis  . Contrast Media [Iodinated Diagnostic Agents] Swelling and Other (See Comments)    Reaction:  Neck swelling   . Niacin Hives  . Sulfa Antibiotics Hives    Medications: Outpatient Encounter Medications as of 02/01/2021  Medication Sig  . cephALEXin (KEFLEX) 500 MG capsule Take 1 capsule (500 mg total) by mouth 2 (two) times daily.  . cyclobenzaprine (FLEXERIL) 5 MG tablet TAKE 1 TABLET BY MOUTH 3 TIMES DAILY AS NEEDED.  Marland Kitchen gabapentin (NEURONTIN) 100 MG capsule TAKE 2 CAPSULES (200 MG TOTAL) BY MOUTH 3 (THREE) TIMES DAILY.  . hydrochlorothiazide (HYDRODIURIL) 25 MG tablet Take 25 mg by mouth daily.  Marland Kitchen LINZESS 290 MCG CAPS capsule TAKE 1 CAPSULE (290 MCG TOTAL) BY MOUTH DAILY BEFORE BREAKFAST.  . metFORMIN (GLUCOPHAGE) 500 MG tablet TAKE 1 TABLET (500 MG TOTAL) BY MOUTH 2 (TWO) TIMES DAILY WITH A MEAL.  . methocarbamol (ROBAXIN) 500 MG tablet Take 500 mg by mouth 2 (two) times daily. Back pain  . metoprolol succinate (TOPROL-XL) 25 MG 24 hr tablet Take 25 mg by mouth daily.  Marland Kitchen omeprazole (PRILOSEC) 20 MG capsule TAKE 1 CAPSULE BY MOUTH EVERY DAY  . ondansetron (ZOFRAN) 4 MG tablet Take 1 tablet (4 mg total) by mouth every 8 (eight) hours as needed.  . traMADol (ULTRAM) 50 MG tablet Take 1 tablet (50 mg total) by mouth every 6 (six) hours as needed.  . traZODone (DESYREL) 50 MG tablet TAKE 1 TABLET (50 MG TOTAL) BY MOUTH AT BEDTIME AS NEEDED. (Patient taking differently: 50  mg at bedtime.)  . Vaginal Lubricant (REPLENS) GEL Place 1 Applicatorful vaginally 3 (three) times a week.  . Vitamin D, Ergocalciferol, (DRISDOL) 1.25 MG (50000 UNIT) CAPS capsule TAKE 1 CAPSULE (50,000 UNITS TOTAL) BY MOUTH EVERY 7 (SEVEN) DAYS.  . [DISCONTINUED] diphenhydrAMINE (BENADRYL) 50 MG tablet Patient is to take one tablet 1 hour before test. (Patient not taking: Reported on 04/28/2020)   No facility-administered encounter medications on file as of 02/01/2021.    Current Diagnosis: Patient Active Problem List   Diagnosis Date Noted  . Callus of foot 01/19/2021  . Pes planus 01/19/2021  . PTTD (posterior tibial tendon dysfunction) 01/19/2021  . Acute flank pain 12/15/2020  . Cystitis 12/15/2020  . Urinary symptom or sign 12/15/2020  . Low back pain radiating to right lower extremity 06/23/2020  . Gastroesophageal reflux disease 04/28/2020  . Large bowel obstruction ([place]) 04/17/2019  . Acquired trigger finger 03/20/2019  . Iron deficiency anemia 11/21/2018  . Syncope 03/18/2018  . Anemia 03/18/2018  . Exophthalmos 02/22/2018  . Multiple thyroid nodules 02/22/2018  . 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 Pain at surgical site 08/30/2016  . Ventral incisional hernia 08/21/2016  . SBO (small bowel obstruction) ([place])   . Partial small bowel obstruction ([place]) 04/23/2016  . Adjustment disorder with mixed anxiety and depressed mood 04/23/2016  . Memory difficulties 09/01/2015  . Arthritis 06/11/2015  . Back pain, chronic 06/11/2015  . HLD (hyperlipidemia) 06/11/2015  .  Cannot sleep 06/11/2015  . L-S radiculopathy 06/11/2015  . Arthritis of knee, degenerative 06/11/2015  . B12 deficiency 06/11/2015  . 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 DM2 (diabetes mellitus, type 2) ([place]) 11/15/2012  . Adiposity 11/21/2009  . Avitaminosis D 11/07/2009  . Narrowing of intervertebral disc space 07/25/2009  . Benign  essential HTN 07/25/2009    Goals Addressed   None    Recent Relevant Labs: Lab Results  Component Value Date/Time   HGBA1C 5.6 12/05/2020 08:55 AM   HGBA1C 5.6 07/07/2020 10:01 AM   HGBA1C 5.8 (H) 06/26/2018 09:23 AM   HGBA1C 6.3 01/18/2014 06:32 AM   MICROALBUR 20 06/23/2020 02:10 PM   MICROALBUR 50 11/18/2017 09:33 AM    Kidney Function Lab Results  Component Value Date/Time   CREATININE 1.48 (H) 12/05/2020 08:55 AM   CREATININE 1.52 (H) 08/18/2020 11:36 AM   CREATININE 1.35 (H) 10/30/2017 10:58 AM   CREATININE 1.15 (H) 08/21/2017 03:32 PM   GFRNONAA 36 (L) 12/05/2020 08:55 AM   GFRNONAA 41 (L) 10/30/2017 10:58 AM   GFRAA 42 (L) 12/05/2020 08:55 AM   GFRAA 48 (L) 10/30/2017 10:58 AM    . Current antihyperglycemic regimen:  ? Metformin 500mg  every morning with breakfast . What recent interventions/DTPs have been made to improve glycemic control:  o None ID . Have there been any recent hospitalizations or ED visits since last visit with CPP? No . Patient denies hypoglycemic symptoms, including Pale, Sweaty, Shaky, Hungry, Nervous/irritable and Vision changes . Patient reports hyperglycemic symptoms, including fatigue and weakness  o Patient states she been feeling tired ,fatigue and weakness due to having COVID. Marland Kitchen How often are you checking your blood sugar?  o Patient states she checks her blood sugar at home but do not keep a log of her readings. . What are your blood sugars ranging?  o Fasting: N/A o Before meals: N/A o After meals: N/A o Bedtime: N/A . During the week, how often does your blood glucose drop below 70? Never . Are you checking your feet daily/regularly?   Patient denies numbness ,pain or tingling sensation in her feet.  Adherence Review: Is the patient currently on a STATIN medication? No Is the patient currently on ACE/ARB medication? No Does the patient have >5 day gap between last estimated fill dates? Yes   Maryjean Ka  Follow-Up:   Pharmacist Review   Anderson [place] Clinical Pharmacist Assistant [phone]

## 2021-02-07 NOTE — Progress Notes (Signed)
Subjective:   Julie Acevedo is a 70 y.o. female who presents for Medicare Annual (Subsequent) preventive examination.  I connected with Julie Acevedo today by telephone and verified that I am speaking with the correct person using two identifiers. Location patient: home Location provider: work Persons participating in the virtual visit: patient, provider.   I discussed the limitations, risks, security and privacy concerns of performing an evaluation and management service by telephone and the availability of in person appointments. I also discussed with the patient that there may be a patient responsible charge related to this service. The patient expressed understanding and verbally consented to this telephonic visit.    Interactive audio and video telecommunications were attempted between this provider and patient, however failed, due to patient having technical difficulties OR patient did not have access to video capability.  We continued and completed visit with audio only.   Review of Systems    N/A  Cardiac Risk Factors include: advanced age (>48men, >10 women);diabetes mellitus;hypertension     Objective:    Today's Vitals   02/08/21 1120  PainSc: 6    There is no height or weight on file to calculate BMI.  Advanced Directives 02/08/2021 05/15/2020 03/31/2020 02/09/2020 02/08/2020 12/18/2019 12/17/2019  Does Patient Have a Medical Advance Directive? No No No No No No No  Does patient want to make changes to medical advance directive? - - - No - Patient declined No - Patient declined - -  Would patient like information on creating a medical advance directive? No - Patient declined - No - Patient declined - - No - Patient declined No - Patient declined  Pre-existing out of facility DNR order (yellow form or pink MOST form) - - - - - - -    Current Medications (verified) Outpatient Encounter Medications as of 02/08/2021  Medication Sig  . cyclobenzaprine (FLEXERIL) 5 MG tablet TAKE  1 TABLET BY MOUTH 3 TIMES DAILY AS NEEDED.  Marland Kitchen gabapentin (NEURONTIN) 100 MG capsule TAKE 2 CAPSULES (200 MG TOTAL) BY MOUTH 3 (THREE) TIMES DAILY.  . hydrochlorothiazide (HYDRODIURIL) 25 MG tablet Take 25 mg by mouth daily.  Marland Kitchen LINZESS 290 MCG CAPS capsule TAKE 1 CAPSULE (290 MCG TOTAL) BY MOUTH DAILY BEFORE BREAKFAST.  . metFORMIN (GLUCOPHAGE) 500 MG tablet TAKE 1 TABLET (500 MG TOTAL) BY MOUTH 2 (TWO) TIMES DAILY WITH A MEAL.  . methocarbamol (ROBAXIN) 500 MG tablet Take 500 mg by mouth 2 (two) times daily. Back pain  . metoprolol succinate (TOPROL-XL) 25 MG 24 hr tablet Take 25 mg by mouth daily.  Marland Kitchen omeprazole (PRILOSEC) 20 MG capsule TAKE 1 CAPSULE BY MOUTH EVERY DAY  . ondansetron (ZOFRAN) 4 MG tablet Take 1 tablet (4 mg total) by mouth every 8 (eight) hours as needed.  . Prasterone (INTRAROSA) 6.5 MG INST Place 6.5 mg vaginally at bedtime.  . traMADol (ULTRAM) 50 MG tablet Take 1 tablet (50 mg total) by mouth every 6 (six) hours as needed.  . traZODone (DESYREL) 50 MG tablet TAKE 1 TABLET (50 MG TOTAL) BY MOUTH AT BEDTIME AS NEEDED. (Patient taking differently: 50 mg at bedtime.)  . Vitamin D, Ergocalciferol, (DRISDOL) 1.25 MG (50000 UNIT) CAPS capsule TAKE 1 CAPSULE (50,000 UNITS TOTAL) BY MOUTH EVERY 7 (SEVEN) DAYS.  . cephALEXin (KEFLEX) 500 MG capsule Take 1 capsule (500 mg total) by mouth 2 (two) times daily. (Patient not taking: No sig reported)  . Vaginal Lubricant (REPLENS) GEL Place 1 Applicatorful vaginally 3 (three) times a  week. (Patient not taking: No sig reported)  . [DISCONTINUED] diphenhydrAMINE (BENADRYL) 50 MG tablet Patient is to take one tablet 1 hour before test. (Patient not taking: Reported on 04/28/2020)   No facility-administered encounter medications on file as of 02/08/2021.    Allergies (verified) Lotrel [amlodipine besy-benazepril hcl], Contrast media [iodinated diagnostic agents], Niacin, and Sulfa antibiotics   History: Past Medical History:  Diagnosis Date   . Anemia   . Arthritis   . COVID-19 11/19/2019  . Diverticulitis   . DM (diabetes mellitus) ([place])    typ e 2  . History of methicillin resistant staphylococcus aureus (MRSA) 2017  . HTN (hypertension)   . Hyperlipidemia   . Memory difficulties 09/01/2015  . Multiple thyroid nodules   . Myocardial infarction ([place]) 1995  . Short-term memory loss    Past Surgical History:  Procedure Laterality Date  . ABDOMINAL HYSTERECTOMY     due to endometriosis-1 ovary left  . BREAST EXCISIONAL BIOPSY Right 1971   neg  . CARDIAC CATHETERIZATION  2000   no significant CAD per note of Dr Caryl Comes in 2013   . CHOLECYSTECTOMY    . CYSTOCELE REPAIR N/A 03/19/2017   Procedure: ANTERIOR REPAIR (CYSTOCELE);  Surgeon: Bjorn Loser, MD;  Location: [facility];  Service: Urology;  Laterality: N/A;  . CYSTOSCOPY N/A 03/19/2017   Procedure: CYSTOSCOPY;  Surgeon: Bjorn Loser, MD;  Location: [facility];  Service: Urology;  Laterality: N/A;  . EYE SURGERY    . FOOT SURGERY    . HAND SURGERY    . HEMICOLECTOMY    . INSERTION OF MESH N/A 08/21/2016   Procedure: INSERTION OF MESH;  Surgeon: Excell Seltzer, MD;  Location: [facility];  Service: General;  Laterality: N/A;  . INSERTION OF MESH N/A 12/08/2019   Procedure: INSERTION OF MESH;  Surgeon: Jules Husbands, MD;  Location: [facility];  Service: General;  Laterality: N/A;  . LAPAROSCOPIC LYSIS OF ADHESIONS N/A 08/21/2016   Procedure: LAPAROSCOPIC LYSIS OF ADHESIONS;  Surgeon: Excell Seltzer, MD;  Location: [facility];  Service: General;  Laterality: N/A;  . LAPAROSCOPIC REMOVAL ABDOMINAL MASS    . LAPAROTOMY N/A 04/16/2019   Procedure: EXPLORATORY LAPAROTOMY;  Surgeon: Benjamine Sprague, DO;  Location: [facility];  Service: General;  Laterality: N/A;  . LOOP RECORDER INSERTION N/A 03/21/2018   Procedure: LOOP RECORDER INSERTION;  Surgeon: Sanda Klein, MD;  Location: [facility];  Service: Cardiovascular;  Laterality: N/A;  . LUMBAR [place]    . ROUX-EN-Y GASTRIC  BYPASS  2010   Dr Hassell Done  . TONSILLECTOMY    . UPPER GI ENDOSCOPY  01/12/16   normal larynx, normal esophagus. gastric bypass with a normal-sized pouch and intact staple line, normal examined jejunum, otherwise exam was normal  . VENTRAL HERNIA REPAIR N/A 08/21/2016   Procedure: LAPAROSCOPIC VENTRAL HERNIA;  Surgeon: Excell Seltzer, MD;  Location: [facility];  Service: General;  Laterality: N/A;  . XI ROBOTIC ASSISTED VENTRAL HERNIA N/A 12/08/2019   Procedure: XI ROBOTIC ASSISTED VENTRAL HERNIA;  Surgeon: Jules Husbands, MD;  Location: [facility];  Service: General;  Laterality: N/A;   Family History  Problem Relation Age of Onset  . Cancer Father 47       Lung  . Coronary artery disease Father 24  . COPD Mother   . Diabetes Mother   . Hypertension Mother   . Cancer Paternal Grandmother        Breast  . Breast cancer Paternal Grandmother   .  Congestive Heart Failure Maternal Grandmother   . Emphysema Maternal Grandfather   . Heart attack Paternal Grandfather   . Cancer Paternal Aunt        Breast  . Breast cancer Paternal Aunt 44  . Breast cancer Paternal Aunt 47   Social History   Socioeconomic History  . Marital status: Married    Spouse name: Not on file  . Number of children: 3  . Years of education: 68  . Highest education level: Some college, no degree  Occupational History  . Occupation: IT consultant: RETIRED  Tobacco Use  . Smoking status: Never Smoker  . Smokeless tobacco: Never Used  Vaping Use  . Vaping Use: Never used  Substance and Sexual Activity  . Alcohol use: No  . Drug use: No  . Sexual activity: Never  Other Topics Concern  . Not on file  Social History Narrative   Lives with husband, daughter and granddaughter.;   Patient drinks about 3 cups of caffeine daily.   Patient is left handed.    Social Determinants of Health   Financial Resource Strain: Low Risk   . Difficulty of Paying Living Expenses: Not hard at all  Food Insecurity: No  Food Insecurity  . Worried About Charity fundraiser in the Last Year: Never true  . Ran Out of Food in the Last Year: Never true  Transportation Needs: No Transportation Needs  . Lack of Transportation (Medical): No  . Lack of Transportation (Non-Medical): No  Physical Activity: Inactive  . Days of Exercise per Week: 0 days  . Minutes of Exercise per Session: 0 min  Stress: No Stress Concern Present  . Feeling of Stress : Not at all  Social Connections: Socially Integrated  . Frequency of Communication with Friends and Family: More than three times a week  . Frequency of Social Gatherings with Friends and Family: More than three times a week  . Attends Religious Services: More than 4 times per year  . Active Member of Clubs or Organizations: Yes  . Attends Archivist Meetings: More than 4 times per year  . Marital Status: Married    Tobacco Counseling Counseling given: Not Answered   Clinical Intake:  Pre-visit preparation completed: Yes  Pain : 0-10 Pain Score: 6  Pain Type: Chronic pain Pain Location: Back Pain Orientation: Right,Lower Pain Descriptors / Indicators: Aching Pain Frequency: Constant Pain Relieving Factors: Taking Tramadol as needed for pain.  Pain Relieving Factors: Taking Tramadol as needed for pain.  Nutritional Risks: None Diabetes: Yes  How often do you need to have someone help you when you read instructions, pamphlets, or other written materials from your doctor or pharmacy?: 1 - Never  Diabetic? Yes  Nutrition Risk Assessment:  Has the patient had any N/V/D within the last 2 months?  No  Does the patient have any non-healing wounds?  No  Has the patient had any unintentional weight loss or weight gain?  No   Diabetes:  Is the patient diabetic?  Yes  If diabetic, was a CBG obtained today?  No  Did the patient bring in their glucometer from home?  No  How often do you monitor your CBG's? Once a day.   Financial Strains and  Diabetes Management:  Are you having any financial strains with the device, your supplies or your medication? No .  Does the patient want to be seen by Chronic Care Management for management of their diabetes?  No  Would the patient like to be referred to a Nutritionist or for Diabetic Management?  No   Diabetic Exams:  Diabetic Eye Exam: Done 06/13/20 Diabetic Foot Exam: Done 01/19/21   Interpreter Needed?: No  Information entered by :: [facility], LPN   Activities of Daily Living In your present state of health, do you have any difficulty performing the following activities: 02/08/2021 11/30/2020  Hearing? N N  Vision? N N  Difficulty concentrating or making decisions? Y Y  Comment SeesDr [name] (neurology regarding memory loss concerns). Last MMSE was completed 11/2020. -  Walking or climbing stairs? Y Y  Comment Due to back pains. -  Dressing or bathing? N N  Doing errands, shopping? N N  Preparing Food and eating ? N -  Using the Toilet? N -  In the past six months, have you accidently leaked urine? N -  Do you have problems with loss of bowel control? N -  Managing your Medications? N -  Managing your Finances? N -  Housekeeping or managing your Housekeeping? N -  Some recent data might be hidden    Patient Care Team: Jerrol Banana., MD as PCP - General (Family Medicine) Yolonda Kida, MD as PCP - Cardiology (Cardiology) Lorelee Cover., MD as Consulting Physician (Ophthalmology) Bjorn Loser, MD as Consulting Physician (Urology) Bary Castilla, Forest Gleason, MD as Consulting Physician (General Surgery) Jules Husbands, MD as Consulting Physician (General Surgery) Gardiner Barefoot, DPM as Consulting Physician (Podiatry) Gae Dry, MD as Referring Physician (Obstetrics and Gynecology) Vladimir Crofts, MD as Consulting Physician (Neurology)  Indicate any recent Medical Services you may have received from other than [facility] providers in the past year (date may be  approximate).     Assessment:   This is a routine wellness examination for [place].  Hearing/Vision screen No exam data present  Dietary issues and exercise activities discussed: Current Exercise Habits: The patient does not participate in regular exercise at present, Exercise limited by: orthopedic condition(s)  Goals    . Chronic Care Management     CARE PLAN ENTRY (see longitudinal plan of care for additional care plan information)  Current Barriers:  . Chronic Disease Management support, education, and care coordination needs related to Hypertension, Hyperlipidemia, Diabetes, GERD, and Chronic Pain   Hypertension BP Readings from Last 3 Encounters:  08/18/20 111/72  07/25/20 98/66  07/07/20 107/71   . Pharmacist Clinical Goal(s): o Over the next 90 days, patient will work with PharmD and providers to maintain BP goal <140/90 . Current regimen:  o Hydrochlorothiazide 25mg  daily o Metoprolol 25mg  daily . Interventions: o None . Patient self care activities - Over the next 90 days, patient will: o Check BP weekly, document, and provide at future appointments o Ensure daily salt intake < 2300 mg/day o Report any low blood pressure, hypotension, dizziness, fainting and falls  Hyperlipidemia Lab Results  Component Value Date/Time   LDLCALC 100 (H) 08/18/2020 11:36 AM   LDLCALC 69 01/18/2014 06:32 AM   . Pharmacist Clinical Goal(s): o Over the next 90 days, patient will work with PharmD and providers to achieve LDL goal < 100 . Current regimen:  o None . Interventions: o Recommend starting Atorvastatin 40 mg daily . Patient self care activities - Over the next 90 days, patient will: o Reduce bad cholesterol slightly through diet improvements including less saturated fat  Diabetes Lab Results  Component Value Date/Time   HGBA1C 5.6 07/07/2020 10:01 AM  HGBA1C 5.8 (H) 06/26/2018 09:23 AM   HGBA1C 5.8 11/18/2017 09:33 AM   HGBA1C 5.8 (H) 03/13/2017 11:37 AM    HGBA1C 6.3 01/18/2014 06:32 AM   . Pharmacist Clinical Goal(s): o Over the next 90 days, patient will work with PharmD and providers to maintain A1c goal <7% . Current regimen:  o Metformin 500mg  every morning with breakfast . Interventions: o None . Patient self care activities - Over the next 90 days, patient will: o Check blood sugar once daily, document, and provide at future appointments o Contact provider with any episodes of hypoglycemia  Medication management . Pharmacist Clinical Goal(s): o Over the next 90 days, patient will work with PharmD and providers to maintain optimal medication adherence . Current pharmacy: [facility] . Interventions o Comprehensive medication review performed. o Continue current medication management strategy . Patient self care activities - Over the next 90 days, patient will: o Focus on medication adherence by continuing current practices o Take medications as prescribed o Report any questions or concerns to PharmD and/or provider(s)    . LIFESTYLE - DECREASE FALLS RISK     Recommend to remove any items from the home that may cause slips or trips.      Depression Screen PHQ 2/9 Scores 02/08/2021 11/30/2020 09/01/2020 08/18/2020 02/08/2020 02/05/2019 02/04/2018  PHQ - 2 Score 0 0 0 0 0 0 1  PHQ- 9 Score - 10 5 9  - - -    Fall Risk Fall Risk  02/08/2021 12/15/2020 11/30/2020 09/01/2020 08/18/2020  Falls in the past year? 1 1 1 1 1   Number falls in past yr: 1 1 0 1 1  Injury with Fall? 0 0 0 0 0  Risk for fall due to : No Fall Risks - History of fall(s) - -  Follow up Falls prevention discussed - Falls evaluation completed;Education provided;Falls prevention discussed Falls evaluation completed Falls evaluation completed    FALL RISK PREVENTION PERTAINING TO THE HOME:  Any stairs in or around the home? Yes  If so, are there any without handrails? No  Home free of loose throw rugs in walkways, pet beds, electrical cords, etc? Yes  Adequate lighting in  your home to reduce risk of falls? Yes   ASSISTIVE DEVICES UTILIZED TO PREVENT FALLS:  Life alert? Yes  Use of a cane, walker or w/c? Yes  Grab bars in the bathroom? Yes  Shower chair or bench in shower? No  Elevated toilet seat or a handicapped toilet? No    Cognitive Function: MMSE - Mini Mental State Exam 11/30/2020 09/01/2015  Orientation to time 4 4  Orientation to Place 5 4  Registration 3 2  Attention/ Calculation 5 5  Recall 2 3  Language- name 2 objects 2 2  Language- repeat 1 0  Language- follow 3 step command 3 2  Language- read & follow direction 1 1  Write a sentence 1 1  Copy design 1 1  Total score 28 25     6CIT Screen 11/30/2020 02/08/2020 12/24/2016  What Year? 0 points 0 points 0 points  What month? 0 points 0 points 0 points  What time? 0 points 0 points 0 points  Count back from 20 0 points 0 points 0 points  Months in reverse 0 points 4 points 0 points  Repeat phrase 6 points 4 points 4 points  Total Score 6 8 4     Immunizations Immunization History  Administered Date(s) Administered  . Fluad Quad(high Dose 65+) 08/15/2019, 09/01/2020  .  Influenza Split 10/17/2010, 10/02/2011, 08/27/2012  . Influenza, High Dose Seasonal PF 08/29/2018  . Influenza,inj,Quad PF,6+ Mos 09/26/2013, 09/06/2014, 09/06/2016  . Influenza,inj,Quad PF,6-35 Mos 08/31/2017  . Influenza-Unspecified 09/15/2015  . PFIZER(Purple Top)SARS-COV-2 Vaccination 01/29/2020, 02/23/2020, 10/11/2020  . Pneumococcal Conjugate-13 08/29/2018  . Pneumococcal Polysaccharide-23 11/17/2012  . Tdap 10/02/2011, 09/27/2016  . Zoster 09/06/2016  . Zoster Recombinat (Shingrix) 08/29/2018, 08/15/2019    TDAP status: Up to date  Flu Vaccine status: Up to date  Pneumococcal vaccine status: Due, Education has been provided regarding the importance of this vaccine. Advised may receive this vaccine at local pharmacy or Health Dept. Aware to provide a copy of the vaccination record if obtained from  local pharmacy or Health Dept. Verbalized acceptance and understanding.  Covid-19 vaccine status: Completed vaccines  Qualifies for Shingles Vaccine? Yes   Zostavax completed Yes   Shingrix Completed?: Yes  Screening Tests Health Maintenance  Topic Date Due  . PNA vac Low Risk Adult (2 of 2 - PPSV23) 08/30/2019  . HEMOGLOBIN A1C  06/05/2021  . OPHTHALMOLOGY EXAM  06/13/2021  . URINE MICROALBUMIN  06/23/2021  . FOOT EXAM  01/19/2022  . MAMMOGRAM  12/01/2022  . DEXA SCAN  03/10/2025  . TETANUS/TDAP  09/27/2026  . COLONOSCOPY (Pts 45-33yrs Insurance coverage will need to be confirmed)  09/05/2028  . INFLUENZA VACCINE  Completed  . COVID-19 Vaccine  Completed  . Hepatitis C Screening  Completed    Health Maintenance  Health Maintenance Due  Topic Date Due  . PNA vac Low Risk Adult (2 of 2 - PPSV23) 08/30/2019    Colorectal cancer screening: Type of screening: Colonoscopy. Completed 09/05/18. Repeat every 10 years  Mammogram status: Completed 12/01/20. Repeat every year  Bone Density status: Completed 03/10/20. Results reflect: Bone density results: OSTEOPENIA. Repeat every 5 years.  Lung Cancer Screening: (Low Dose CT Chest recommended if Age 79-80 years, 30 pack-year currently smoking OR have quit w/in 15years.) does not qualify.   Additional Screening:  Hepatitis C Screening: Up to date  Vision Screening: Recommended annual ophthalmology exams for early detection of glaucoma and other disorders of the eye. Is the patient up to date with their annual eye exam?  Yes  Who is the provider or what is the name of the office in which the patient attends annual eye exams? Dr Gloriann Loan If pt is not established with a provider, would they like to be referred to a provider to establish care? No .   Dental Screening: Recommended annual dental exams for proper oral hygiene  Community Resource Referral / Chronic Care Management: CRR required this visit?  No   CCM required this visit?   No      Plan:     I have personally reviewed and noted the following in the patient's chart:   . Medical and social history . Use of alcohol, tobacco or illicit drugs  . Current medications and supplements . Functional ability and status . Nutritional status . Physical activity . Advanced directives . List of other physicians . Hospitalizations, surgeries, and ER visits in previous 12 months . Vitals . Screenings to include cognitive, depression, and falls . Referrals and appointments  In addition, I have reviewed and discussed with patient certain preventive protocols, quality metrics, and best practice recommendations. A written personalized care plan for preventive services as well as general preventive health recommendations were provided to patient.     Jessee Mezera [place], [place]   2/99/3716   Nurse Notes: Pt to receive a  Pneumovax 23 vaccine at next in office apt.

## 2021-02-08 ENCOUNTER — Other Ambulatory Visit: Payer: Self-pay

## 2021-02-08 ENCOUNTER — Ambulatory Visit (INDEPENDENT_AMBULATORY_CARE_PROVIDER_SITE_OTHER): Payer: Medicare HMO

## 2021-02-08 ENCOUNTER — Encounter: Payer: Self-pay | Admitting: Obstetrics & Gynecology

## 2021-02-08 ENCOUNTER — Ambulatory Visit (INDEPENDENT_AMBULATORY_CARE_PROVIDER_SITE_OTHER): Payer: Medicare HMO | Admitting: Obstetrics & Gynecology

## 2021-02-08 VITALS — BP 120/80 | Ht 63.0 in | Wt 133.0 lb

## 2021-02-08 DIAGNOSIS — N9419 Other specified dyspareunia: Secondary | ICD-10-CM | POA: Diagnosis not present

## 2021-02-08 DIAGNOSIS — N952 Postmenopausal atrophic vaginitis: Secondary | ICD-10-CM

## 2021-02-08 DIAGNOSIS — Z0000 Encounter for general adult medical examination without abnormal findings: Secondary | ICD-10-CM

## 2021-02-08 MED ORDER — INTRAROSA 6.5 MG VA INST: 6.5000 mg | VAGINAL_INSERT | Freq: Every day | VAGINAL | 11 refills | Status: DC

## 2021-02-08 NOTE — Patient Instructions (Signed)
Prasterone vaginal insert What is this medicine? PRASTERONE (PRAS ter one), also known as DEHYDROEPIANDROSTERONE (DHEA) is used to treat females who experience painful sexual intercourse, a symptom of menopause that occurs due to changes in and around the vagina. This medicine may be used for other purposes; ask your health care provider or pharmacist if you have questions. COMMON BRAND NAME(S): INTRAROSA What should I tell my health care provider before I take this medicine? They need to know if you have any of these conditions:  cancer, such as breast, uterine, or other cancer  history of vaginal bleeding  an unusual or allergic reaction to prasterone, DHEA, other hormones, medicines, foods, dyes, or preservatives  pregnant or trying to get pregnant  breast-feeding How should I use this medicine? This medicine is for vaginal use only. Do not take by mouth. Follow the directions on the prescription label. Read package directions carefully before using. Wash hands before and after use. Use this medicine at bedtime. Do not use it more often than directed. Do not stop using except on your doctor's advice. Talk to your pediatrician regarding the use of this medicine in children. This medicine is not approved for use in children. Overdosage: If you think you have taken too much of this medicine contact a poison control center or emergency room at once. NOTE: This medicine is only for you. Do not share this medicine with others. What if I miss a dose? If you miss a dose, use it as soon as you can. If it is almost time for your next dose, use only that dose. Do not use double or extra doses. What may interact with this medicine? Interactions are not expected. Do not use any other vaginal products without telling your doctor or health care professional. This list may not describe all possible interactions. Give your health care provider a list of all the medicines, herbs, non-prescription drugs,  or dietary supplements you use. Also tell them if you smoke, drink alcohol, or use illegal drugs. Some items may interact with your medicine. What should I watch for while using this medicine? Visit your doctor or health care professional for a regular check on your progress. This medicine may cause changes on a cervical Pap smear. You will receive regular pelvic exams. What side effects may I notice from receiving this medicine? Side effects that you should report to your doctor or health care professional as soon as possible:  allergic reactions like skin rash, itching or hives Side effects that usually do not require medical attention (report to your doctor or health care professional if they continue or are bothersome):  vaginal discharge This list may not describe all possible side effects. Call your doctor for medical advice about side effects. You may report side effects to FDA at 1-800-FDA-1088. Where should I keep my medicine? Keep out of the reach of children. Store at room temperature or in a refrigerator between 5 and 30 degrees C (41 and 86 degrees F). Throw away any unused medicine after the expiration date. NOTE: This sheet is a summary. It may not cover all possible information. If you have questions about this medicine, talk to your doctor, pharmacist, or health care provider.  2021 Elsevier/Gold Standard (2016-05-29 12:30:18)

## 2021-02-08 NOTE — Progress Notes (Signed)
History of Present Illness:  Julie Acevedo is a 70 y.o. who was supposed to be started on REPLENS for vaginal dryness and dyspareunia approximately 2 months ago. Since that time, she states that her symptoms show no change, as she was unable to get the Rx at the pharmacy (says they reported it was on backorder).  Her main sx's are burning sensation and raw feeling w intercourse, even post coital spotting at times.  Prior hysterectomy.  Prior PE, not candidate for ERT.  PMHx: She  has a past medical history of Anemia, Arthritis, COVID-19 (11/19/2019), Diverticulitis, DM (diabetes mellitus) ([place]), History of methicillin resistant staphylococcus aureus (MRSA) (2017), HTN (hypertension), Hyperlipidemia, Memory difficulties (09/01/2015), Multiple thyroid nodules, Myocardial infarction ([place]) (1995), and Short-term memory loss. Also,  has a past surgical history that includes Hemicolectomy; Lumbar disc surgery; Roux-en-Y Gastric Bypass (2010); Cholecystectomy; Eye surgery; Hand surgery; Foot surgery; Abdominal hysterectomy; Tonsillectomy; Upper gi endoscopy (01/12/16); Cardiac catheterization (2000); Laparoscopic lysis of adhesions (N/A, 08/21/2016); Ventral hernia repair (N/A, 08/21/2016); Insertion of mesh (N/A, 08/21/2016); Cystocele repair (N/A, 03/19/2017); Cystoscopy (N/A, 03/19/2017); Breast excisional biopsy (Right, 1971); Laparoscopic removal abdominal mass; LOOP RECORDER INSERTION (N/A, 03/21/2018); laparotomy (N/A, 04/16/2019); XI robotic assisted ventral hernia (N/A, 12/08/2019); and Insertion of mesh (N/A, 12/08/2019)., family history includes Breast cancer in her paternal grandmother; Breast cancer (age of onset: 65) in her paternal aunt; Breast cancer (age of onset: 40) in her paternal aunt; COPD in her mother; Cancer in her paternal aunt and paternal grandmother; Cancer (age of onset: 62) in her father; Congestive Heart Failure in her maternal grandmother; Coronary artery disease (age of onset: 38) in her father;  Diabetes in her mother; Emphysema in her maternal grandfather; Heart attack in her paternal grandfather; Hypertension in her mother.,  reports that she has never smoked. She has never used smokeless tobacco. She reports that she does not drink alcohol and does not use drugs. Current Meds  Medication Sig  . cyclobenzaprine (FLEXERIL) 5 MG tablet TAKE 1 TABLET BY MOUTH 3 TIMES DAILY AS NEEDED.  Marland Kitchen gabapentin (NEURONTIN) 100 MG capsule TAKE 2 CAPSULES (200 MG TOTAL) BY MOUTH 3 (THREE) TIMES DAILY.  . hydrochlorothiazide (HYDRODIURIL) 25 MG tablet Take 25 mg by mouth daily.  Marland Kitchen LINZESS 290 MCG CAPS capsule TAKE 1 CAPSULE (290 MCG TOTAL) BY MOUTH DAILY BEFORE BREAKFAST.  . metFORMIN (GLUCOPHAGE) 500 MG tablet TAKE 1 TABLET (500 MG TOTAL) BY MOUTH 2 (TWO) TIMES DAILY WITH A MEAL.  . methocarbamol (ROBAXIN) 500 MG tablet Take 500 mg by mouth 2 (two) times daily. Back pain  . metoprolol succinate (TOPROL-XL) 25 MG 24 hr tablet Take 25 mg by mouth daily.  Marland Kitchen omeprazole (PRILOSEC) 20 MG capsule TAKE 1 CAPSULE BY MOUTH EVERY DAY  . ondansetron (ZOFRAN) 4 MG tablet Take 1 tablet (4 mg total) by mouth every 8 (eight) hours as needed.  . Prasterone (INTRAROSA) 6.5 MG INST Place 6.5 mg vaginally at bedtime.  . traMADol (ULTRAM) 50 MG tablet Take 1 tablet (50 mg total) by mouth every 6 (six) hours as needed.  . traZODone (DESYREL) 50 MG tablet TAKE 1 TABLET (50 MG TOTAL) BY MOUTH AT BEDTIME AS NEEDED. (Patient taking differently: 50 mg at bedtime.)  . Vitamin D, Ergocalciferol, (DRISDOL) 1.25 MG (50000 UNIT) CAPS capsule TAKE 1 CAPSULE (50,000 UNITS TOTAL) BY MOUTH EVERY 7 (SEVEN) DAYS.  Marland Kitchen Also, is allergic to lotrel [amlodipine besy-benazepril hcl], contrast media [iodinated diagnostic agents], niacin, and sulfa antibiotics..  Review of Systems  All other systems reviewed and are negative.   Physical Exam:  BP 120/80   Ht 5\' 3"  (1.6 m)   Wt 133 lb (60.3 kg)   BMI 23.56 kg/m  Body mass index is 23.56  kg/m. Constitutional: Well nourished, well developed female in no acute distress.  Abdomen: diffusely non tender to palpation, non distended, and no masses, hernias Neuro: Grossly intact Psych:  Normal mood and affect.    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  It does not have a contraindication based on DVT or PE risk.  Info and Rx gv to pt.  She was amenable to this plan and we will see her back for f/u in 2 mos.  A total of 20 minutes were spent face-to-face with the patient as well as preparation, review, communication, and documentation during this encounter.   Barnett Applebaum, MD, [facility], [facility] 02/08/2021  9:11 AM

## 2021-02-08 NOTE — Patient Instructions (Signed)
[name] , Thank you for taking time to come for your Medicare Wellness Visit. I appreciate your ongoing commitment to your health goals. Please review the following plan we discussed and let me know if I can assist you in the future.   Screening recommendations/referrals: Colonoscopy: Up to date, due 08/2028 Mammogram: Up to date, due 11/2021 Bone Density: Up to date, due 02/2025 Recommended yearly ophthalmology/optometry visit for glaucoma screening and checkup Recommended yearly dental visit for hygiene and checkup  Vaccinations: Influenza vaccine: Done 09/01/20 Pneumococcal vaccine: Pneumovax 23 due Tdap vaccine: Up to date, due 09/2026 Shingles vaccine: Completed series    Advanced directives: Please bring a copy of your POA (Power of [place]) and/or Living Will to your next appointment.   Conditions/risks identified: Fall risk preventatives discussed today.   Next appointment: 02/27/21 @ 1:00 PM for a CCM call. Declined scheduling an AWV for 2023 at this time.    Preventive Care 68 Years and Older, Female Preventive care refers to lifestyle choices and visits with your health care provider that can promote health and wellness. What does preventive care include?  A yearly physical exam. This is also called an annual well check.  Dental exams once or twice a year.  Routine eye exams. Ask your health care provider how often you should have your eyes checked.  Personal lifestyle choices, including:  Daily care of your teeth and gums.  Regular physical activity.  Eating a healthy diet.  Avoiding tobacco and drug use.  Limiting alcohol use.  Practicing safe sex.  Taking low-dose aspirin every day.  Taking vitamin and mineral supplements as recommended by your health care provider. What happens during an annual well check? The services and screenings done by your health care provider during your annual well check will depend on your age, overall health, lifestyle risk  factors, and family history of disease. Counseling  Your health care provider may ask you questions about your:  Alcohol use.  Tobacco use.  Drug use.  Emotional well-being.  Home and relationship well-being.  Sexual activity.  Eating habits.  History of falls.  Memory and ability to understand (cognition).  Work and work Statistician.  Reproductive health. Screening  You may have the following tests or measurements:  Height, weight, and BMI.  Blood pressure.  Lipid and cholesterol levels. These may be checked every 5 years, or more frequently if you are over 2 years old.  Skin check.  Lung cancer screening. You may have this screening every year starting at age 88 if you have a 30-pack-year history of smoking and currently smoke or have quit within the past 15 years.  Fecal occult blood test (FOBT) of the stool. You may have this test every year starting at age 79.  Flexible sigmoidoscopy or colonoscopy. You may have a sigmoidoscopy every 5 years or a colonoscopy every 10 years starting at age 63.  Hepatitis C blood test.  Hepatitis B blood test.  Sexually transmitted disease (STD) testing.  Diabetes screening. This is done by checking your blood sugar (glucose) after you have not eaten for a while (fasting). You may have this done every 1-3 years.  Bone density scan. This is done to screen for osteoporosis. You may have this done starting at age 25.  Mammogram. This may be done every 1-2 years. Talk to your health care provider about how often you should have regular mammograms. Talk with your health care provider about your test results, treatment options, and if necessary, the  need for more tests. Vaccines  Your health care provider may recommend certain vaccines, such as:  Influenza vaccine. This is recommended every year.  Tetanus, diphtheria, and acellular pertussis (Tdap, Td) vaccine. You may need a Td booster every 10 years.  Zoster vaccine. You  may need this after age 32.  Pneumococcal 13-valent conjugate (PCV13) vaccine. One dose is recommended after age 35.  Pneumococcal polysaccharide (PPSV23) vaccine. One dose is recommended after age 45. Talk to your health care provider about which screenings and vaccines you need and how often you need them. This information is not intended to replace advice given to you by your health care provider. Make sure you discuss any questions you have with your health care provider. Document Released: 12/30/2015 Document Revised: 08/22/2016 Document Reviewed: 10/04/2015 Elsevier Interactive Patient Education  2017 [place] Prevention in the Home Falls can cause injuries. They can happen to people of all ages. There are many things you can do to make your home safe and to help prevent falls. What can I do on the outside of my home?  Regularly fix the edges of walkways and driveways and fix any cracks.  Remove anything that might make you trip as you walk through a door, such as a raised step or threshold.  Trim any bushes or trees on the path to your home.  Use bright outdoor lighting.  Clear any walking paths of anything that might make someone trip, such as rocks or tools.  Regularly check to see if handrails are loose or broken. Make sure that both sides of any steps have handrails.  Any raised decks and porches should have guardrails on the edges.  Have any leaves, snow, or ice cleared regularly.  Use sand or salt on walking paths during winter.  Clean up any spills in your garage right away. This includes oil or grease spills. What can I do in the bathroom?  Use night lights.  Install grab bars by the toilet and in the tub and shower. Do not use towel bars as grab bars.  Use non-skid mats or decals in the tub or shower.  If you need to sit down in the shower, use a plastic, non-slip stool.  Keep the floor dry. Clean up any water that spills on the floor as soon as  it happens.  Remove soap buildup in the tub or shower regularly.  Attach bath mats securely with double-sided non-slip rug tape.  Do not have throw rugs and other things on the floor that can make you trip. What can I do in the bedroom?  Use night lights.  Make sure that you have a light by your bed that is easy to reach.  Do not use any sheets or blankets that are too big for your bed. They should not hang down onto the floor.  Have a firm chair that has side arms. You can use this for support while you get dressed.  Do not have throw rugs and other things on the floor that can make you trip. What can I do in the kitchen?  Clean up any spills right away.  Avoid walking on wet floors.  Keep items that you use a lot in easy-to-reach places.  If you need to reach something above you, use a strong step stool that has a grab bar.  Keep electrical cords out of the way.  Do not use floor polish or wax that makes floors slippery. If you must use wax,  use non-skid floor wax.  Do not have throw rugs and other things on the floor that can make you trip. What can I do with my stairs?  Do not leave any items on the stairs.  Make sure that there are handrails on both sides of the stairs and use them. Fix handrails that are broken or loose. Make sure that handrails are as long as the stairways.  Check any carpeting to make sure that it is firmly attached to the stairs. Fix any carpet that is loose or worn.  Avoid having throw rugs at the top or bottom of the stairs. If you do have throw rugs, attach them to the floor with carpet tape.  Make sure that you have a light switch at the top of the stairs and the bottom of the stairs. If you do not have them, ask someone to add them for you. What else can I do to help prevent falls?  Wear shoes that:  Do not have high heels.  Have rubber bottoms.  Are comfortable and fit you well.  Are closed at the toe. Do not wear sandals.  If  you use a stepladder:  Make sure that it is fully opened. Do not climb a closed stepladder.  Make sure that both sides of the stepladder are locked into place.  Ask someone to hold it for you, if possible.  Clearly mark and make sure that you can see:  Any grab bars or handrails.  First and last steps.  Where the edge of each step is.  Use tools that help you move around (mobility aids) if they are needed. These include:  Canes.  Walkers.  Scooters.  Crutches.  Turn on the lights when you go into a dark area. Replace any light bulbs as soon as they burn out.  Set up your furniture so you have a clear path. Avoid moving your furniture around.  If any of your floors are uneven, fix them.  If there are any pets around you, be aware of where they are.  Review your medicines with your doctor. Some medicines can make you feel dizzy. This can increase your chance of falling. Ask your doctor what other things that you can do to help prevent falls. This information is not intended to replace advice given to you by your health care provider. Make sure you discuss any questions you have with your health care provider. Document Released: 09/29/2009 Document Revised: 05/10/2016 Document Reviewed: 01/07/2015 Elsevier Interactive Patient Education  2017 [employer].

## 2021-02-09 DIAGNOSIS — R569 Unspecified convulsions: Secondary | ICD-10-CM | POA: Diagnosis not present

## 2021-02-09 DIAGNOSIS — E538 Deficiency of other specified B group vitamins: Secondary | ICD-10-CM | POA: Diagnosis not present

## 2021-02-09 DIAGNOSIS — Z8639 Personal history of other endocrine, nutritional and metabolic disease: Secondary | ICD-10-CM | POA: Diagnosis not present

## 2021-02-09 DIAGNOSIS — E519 Thiamine deficiency, unspecified: Secondary | ICD-10-CM | POA: Diagnosis not present

## 2021-02-09 DIAGNOSIS — R4189 Other symptoms and signs involving cognitive functions and awareness: Secondary | ICD-10-CM | POA: Diagnosis not present

## 2021-02-13 ENCOUNTER — Encounter: Payer: Self-pay | Admitting: Podiatry

## 2021-02-13 ENCOUNTER — Ambulatory Visit (INDEPENDENT_AMBULATORY_CARE_PROVIDER_SITE_OTHER): Payer: Medicare HMO | Admitting: Podiatry

## 2021-02-13 ENCOUNTER — Other Ambulatory Visit: Payer: Self-pay

## 2021-02-13 DIAGNOSIS — M79676 Pain in unspecified toe(s): Secondary | ICD-10-CM | POA: Diagnosis not present

## 2021-02-13 DIAGNOSIS — B351 Tinea unguium: Secondary | ICD-10-CM

## 2021-02-13 DIAGNOSIS — D2371 Other benign neoplasm of skin of right lower limb, including hip: Secondary | ICD-10-CM

## 2021-02-13 DIAGNOSIS — D2372 Other benign neoplasm of skin of left lower limb, including hip: Secondary | ICD-10-CM

## 2021-02-13 NOTE — Progress Notes (Signed)
She presents today chief complaint of pain in her long toenails and in her calluses.  Objective: Vital signs are stable alert oriented x3.  Pulses are palpable.  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  Large nucleated hyperkeratotic lesion with no open lesions or wounds.  Assessment: Pain in limb secondary to benign skin lesions and painful elongated toenails.  Plan: Debrided painfully elongated toenails and debrided reactive hyperkeratotic lesion and benign skin lesion.  Follow-up with me as needed

## 2021-02-14 ENCOUNTER — Ambulatory Visit (INDEPENDENT_AMBULATORY_CARE_PROVIDER_SITE_OTHER): Payer: Medicare HMO

## 2021-02-14 ENCOUNTER — Other Ambulatory Visit: Payer: Self-pay | Admitting: Neurology

## 2021-02-14 ENCOUNTER — Other Ambulatory Visit (HOSPITAL_COMMUNITY): Payer: Self-pay | Admitting: Neurology

## 2021-02-14 DIAGNOSIS — R55 Syncope and collapse: Secondary | ICD-10-CM

## 2021-02-14 DIAGNOSIS — R4189 Other symptoms and signs involving cognitive functions and awareness: Secondary | ICD-10-CM

## 2021-02-14 LAB — CUP PACEART REMOTE DEVICE CHECK
Date Time Interrogation Session: 20220228234018
Implantable Pulse Generator Implant Date: 20190405

## 2021-02-18 ENCOUNTER — Ambulatory Visit (HOSPITAL_COMMUNITY)
Admission: RE | Admit: 2021-02-18 | Discharge: 2021-02-18 | Disposition: A | Discharge status: Home / Self Care | Payer: Medicare HMO | Source: Ambulatory Visit | Attending: Neurology | Admitting: Neurology

## 2021-02-18 ENCOUNTER — Other Ambulatory Visit: Payer: Self-pay

## 2021-02-18 DIAGNOSIS — G3184 Mild cognitive impairment, so stated: Secondary | ICD-10-CM | POA: Diagnosis not present

## 2021-02-18 DIAGNOSIS — R4189 Other symptoms and signs involving cognitive functions and awareness: Secondary | ICD-10-CM | POA: Diagnosis not present

## 2021-02-18 DIAGNOSIS — I6782 Cerebral ischemia: Secondary | ICD-10-CM | POA: Diagnosis not present

## 2021-02-18 DIAGNOSIS — G9389 Other specified disorders of brain: Secondary | ICD-10-CM | POA: Diagnosis not present

## 2021-02-18 DIAGNOSIS — M2669 Other specified disorders of temporomandibular joint: Secondary | ICD-10-CM | POA: Diagnosis not present

## 2021-02-22 ENCOUNTER — Telehealth: Payer: Self-pay

## 2021-02-22 NOTE — Telephone Encounter (Signed)
Patient was informed of MRI results from Dr. Trena Platt office. However, she does not understands what this means?   Generalized brain volume loss, specifically hippocampus. Mild white matter microvascular ischemic and metabolic changes.    Electronically signed by Ray Church, MD at 02/21/2021 1:33 PM EST    Patient wanted to know if you could let her know if this is something she should be concerned about. Please advise. Thanks!

## 2021-02-22 NOTE — Progress Notes (Signed)
[facility] Summary Report / Loop Recorder 

## 2021-02-22 NOTE — Telephone Encounter (Signed)
Copied from [place] [id number]. Topic: General - Inquiry >> Feb 22, 2021 10:17 AM Loma [place] wrote: CRM for notification. See Telephone encounter for: 02/22/21.Pt is wanting a cb from Dr Darnell Level nurse in [place] her brain scan on 3/5. States worried and not knowing what is going on. FU at [phone]

## 2021-02-24 ENCOUNTER — Telehealth: Payer: Self-pay

## 2021-02-24 DIAGNOSIS — R569 Unspecified convulsions: Secondary | ICD-10-CM | POA: Diagnosis not present

## 2021-02-24 NOTE — Progress Notes (Signed)
Spoke to patient to confirmed patient telephone appointment on 02/27/2021 for CCM at 1:00 pm with [name] the Clinical pharmacist.    Patient Verbalized understanding and denies any side effects from current medications.  [place] Pharmacist Assistant [phone]

## 2021-02-27 ENCOUNTER — Ambulatory Visit (INDEPENDENT_AMBULATORY_CARE_PROVIDER_SITE_OTHER): Payer: Medicare HMO

## 2021-02-27 DIAGNOSIS — E1122 Type 2 diabetes mellitus with diabetic chronic kidney disease: Secondary | ICD-10-CM

## 2021-02-27 DIAGNOSIS — N1832 Chronic kidney disease, stage 3b: Secondary | ICD-10-CM

## 2021-02-27 DIAGNOSIS — E1169 Type 2 diabetes mellitus with other specified complication: Secondary | ICD-10-CM

## 2021-02-27 DIAGNOSIS — E785 Hyperlipidemia, unspecified: Secondary | ICD-10-CM | POA: Diagnosis not present

## 2021-02-27 DIAGNOSIS — R4189 Other symptoms and signs involving cognitive functions and awareness: Secondary | ICD-10-CM

## 2021-02-27 DIAGNOSIS — I152 Hypertension secondary to endocrine disorders: Secondary | ICD-10-CM | POA: Diagnosis not present

## 2021-02-27 DIAGNOSIS — E1159 Type 2 diabetes mellitus with other circulatory complications: Secondary | ICD-10-CM

## 2021-02-27 NOTE — Progress Notes (Signed)
Chronic Care Management Pharmacy Note  02/27/2021 Name:  KADRA KOHAN MRN:  [id number] DOB:  [birth year]  Subjective: Julie Acevedo is an 70 y.o. year old female who is a primary patient of Jerrol Banana., MD.  The CCM team was consulted for assistance with disease management and care coordination needs.    Engaged with patient by telephone for follow up visit in response to provider referral for pharmacy case management and/or care coordination services.   Consent to Services:  The patient was given information about Chronic Care Management services, agreed to services, and gave verbal consent prior to initiation of services.  Please see initial visit note for detailed documentation.   Patient Care Team: Jerrol Banana., MD as PCP - General (Family Medicine) Yolonda Kida, MD as PCP - Cardiology (Cardiology) Lorelee Cover., MD as Consulting Physician (Ophthalmology) Bjorn Loser, MD as Consulting Physician (Urology) Bary Castilla, Forest Gleason, MD as Consulting Physician (General Surgery) Jules Husbands, MD as Consulting Physician (General Surgery) Gardiner Barefoot, DPM as Consulting Physician (Podiatry) Gae Dry, MD as Referring Physician (Obstetrics and Gynecology) Vladimir Crofts, MD as Consulting Physician (Neurology)  Recent office visits: 12/15/20: Patient presented to Laverna Peace, FNP for UTI. Patient started on cephalexin 500 mg twice daily.  11/30/20: Patient presented to Dr. Rosanna Randy for follow-up. Patient referred to neurology for cognitive impairment. Mupirocin stopped (no longer taking)   Recent consult visits: 02/09/21: Patient presented to Dr. [name] (neurology) for cognitive impairment. MRI ordered. Recommended Vitamin B12 injections + PO, folic acid.   Hospital visits: None in previous 6 months  Objective:  Lab Results  Component Value Date   CREATININE 1.48 (H) 12/05/2020   BUN 17 12/05/2020   GFRNONAA 36 (L) 12/05/2020   GFRAA 42  (L) 12/05/2020   NA 141 12/05/2020   K 3.4 (L) 12/05/2020   CALCIUM 9.1 12/05/2020   CO2 25 12/05/2020    Lab Results  Component Value Date/Time   HGBA1C 5.6 12/05/2020 08:55 AM   HGBA1C 5.6 07/07/2020 10:01 AM   HGBA1C 5.8 (H) 06/26/2018 09:23 AM   HGBA1C 6.3 01/18/2014 06:32 AM   MICROALBUR 20 06/23/2020 02:10 PM   MICROALBUR 50 11/18/2017 09:33 AM    Last diabetic Eye exam:  Lab Results  Component Value Date/Time   HMDIABEYEEXA No Retinopathy 12/03/2019 12:00 AM    Last diabetic Foot exam: No results found for: HMDIABFOOTEX   Lab Results  Component Value Date   CHOL 163 12/05/2020   HDL 61 12/05/2020   LDLCALC 86 12/05/2020   TRIG 84 12/05/2020   CHOLHDL 2.7 12/05/2020    Hepatic Function Latest Ref Rng & Units 12/05/2020 08/18/2020 12/17/2019  Total Protein 6.0 - 8.5 g/dL 6.4 6.8 7.9  Albumin 3.8 - 4.8 g/dL 3.8 4.1 3.6  AST 0 - 40 IU/L 15 19 14(L)  ALT 0 - 32 IU/L _0 Alk Phosphatase 44 - 121 IU/L 191(H) 191(H) 115  Total Bilirubin 0.0 - 1.2 mg/dL 0.4 0.3 0.4    Lab Results  Component Value Date/Time   TSH 3.730 12/05/2020 08:55 AM   TSH 2.170 08/18/2020 11:36 AM    CBC Latest Ref Rng & Units 12/05/2020 09/09/2020 08/18/2020  WBC 3.4 - 10.8 x10E3/uL 7.0 6.1 6.2  Hemoglobin 11.1 - 15.9 g/dL 11.0(L) 11.6(L) 11.8  Hematocrit 34.0 - 46.6 % 34.1 35.0(L) 36.2  Platelets 150 - 450 x10E3/uL 346 321 286    Lab Results  Component Value  Date/Time   VD25OH 8.7 (L) 06/16/2019 03:51 PM    Clinical ASCVD: Yes  The 10-year ASCVD risk score Mikey Bussing DC Jr., et al., 2013) is: 12.4%   Values used to calculate the score:     Age: 76 years     Sex: Female     Is Non-Hispanic African American: Yes     Diabetic: Yes     Tobacco smoker: No     Systolic Blood Pressure: 071 mmHg     Is BP treated: Yes     HDL Cholesterol: 61 mg/dL     Total Cholesterol: 163 mg/dL    Depression screen [facility] 2/9 02/08/2021 11/30/2020 09/01/2020  Decreased Interest 0 0 0  Down, Depressed,  Hopeless 0 0 0  PHQ - 2 Score 0 0 0  Altered sleeping - 1 1  Tired, decreased energy - 3 1  Change in appetite - 2 1  Feeling bad or failure about yourself  - 0 0  Trouble concentrating - 3 0  Moving slowly or fidgety/restless - 1 2  Suicidal thoughts - 0 0  PHQ-9 Score - 10 5  Difficult doing work/chores - Not difficult at all Not difficult at all  Some recent data might be hidden    Social History   Tobacco Use  Smoking Status Never Smoker  Smokeless Tobacco Never Used   BP Readings from Last 3 Encounters:  02/08/21 120/80  12/15/20 (!) 103/58  11/30/20 96/76   Pulse Readings from Last 3 Encounters:  12/15/20 66  11/30/20 63  09/01/20 70   Wt Readings from Last 3 Encounters:  02/08/21 133 lb (60.3 kg)  12/15/20 133 lb 9.6 oz (60.6 kg)  11/30/20 132 lb (59.9 kg)    Assessment/Interventions: Review of patient past medical history, allergies, medications, health status, including review of consultants reports, laboratory and other test data, was performed as part of comprehensive evaluation and provision of chronic care management services.   SDOH:  (Social Determinants of Health) assessments and interventions performed: Yes SDOH Interventions   Flowsheet Row Most Recent Value  SDOH Interventions   Financial Strain Interventions Intervention Not Indicated      CCM Care Plan  Allergies  Allergen Reactions  . Lotrel [Amlodipine Besy-Benazepril Hcl] Anaphylaxis  . Contrast Media [Iodinated Diagnostic Agents] Swelling and Other (See Comments)    Reaction:  Neck swelling   . Niacin Hives  . Sulfa Antibiotics Hives    Medications Reviewed Today    Reviewed by Rip Harbour, [facility] (Certified Podiatric Assistant) on 02/13/21 at [phone]  Med List Status: <None>  Medication Order Taking? Sig Documenting Provider Last Dose Status Informant  cephALEXin (KEFLEX) 500 MG capsule [id number] No Take 1 capsule (500 mg total) by mouth 2 (two) times daily.  Patient not taking:  No sig reported   Flinchum, Kelby Aline, FNP Not Taking Active   cyclobenzaprine (FLEXERIL) 5 MG tablet [id number] No TAKE 1 TABLET BY MOUTH 3 TIMES DAILY AS NEEDED. Jerrol Banana., MD Taking Active        Patient not taking:      Discontinued 05/15/20 1816   gabapentin (NEURONTIN) 100 MG capsule [id number] No TAKE 2 CAPSULES (200 MG TOTAL) BY MOUTH 3 (THREE) TIMES DAILY. Jerrol Banana., MD Taking Active            Med Note [facility], [facility] A   Wed Feb 08, 2021 11:26 AM) Taking twice daily  hydrochlorothiazide (HYDRODIURIL) 25 MG tablet [id number] No Take 25  mg by mouth daily. [provider] Taking Active Self  LINZESS 290 MCG CAPS capsule [id number] No TAKE 1 CAPSULE (290 MCG TOTAL) BY MOUTH DAILY BEFORE BREAKFAST. Jerrol Banana., MD Taking Active   metFORMIN (GLUCOPHAGE) 500 MG tablet [id number] No TAKE 1 TABLET (500 MG TOTAL) BY MOUTH 2 (TWO) TIMES DAILY WITH A MEAL. Jerrol Banana., MD Taking Active            Med Note [facility], [facility] A   Wed Feb 08, 2021 11:26 AM) Taking once daily  methocarbamol (ROBAXIN) 500 MG tablet [id number] No Take 500 mg by mouth 2 (two) times daily. Back pain [provider] Taking Active Self  metoprolol succinate (TOPROL-XL) 25 MG 24 hr tablet [id number] No Take 25 mg by mouth daily. [provider] Taking Active   omeprazole (PRILOSEC) 20 MG capsule [id number] No TAKE 1 CAPSULE BY MOUTH EVERY DAY Jerrol Banana., MD Taking Active   ondansetron [facility]) 4 MG tablet [id number] No Take 1 tablet (4 mg total) by mouth every 8 (eight) hours as needed. Pabon, Diego F, MD Taking Active   Prasterone (INTRAROSA) 6.5 MG INST [id number] No Place 6.5 mg vaginally at bedtime. Gae Dry, MD Taking Active   traMADol Veatrice Bourbon) 50 MG tablet [id number] No Take 1 tablet (50 mg total) by mouth every 6 (six) hours as needed. Jerrol Banana., MD Taking Active   traZODone (DESYREL) 50 MG tablet [id number] No TAKE 1  TABLET (50 MG TOTAL) BY MOUTH AT BEDTIME AS NEEDED.  Patient taking differently: 50 mg at bedtime.   Jerrol Banana., MD Taking Active   Vaginal Lubricant (REPLENS) GEL [id number] No Place 1 Applicatorful vaginally 3 (three) times a week.  Patient not taking: No sig reported   Gae Dry, MD Not Taking Active   Vitamin D, Ergocalciferol, (DRISDOL) 1.25 MG (50000 UNIT) CAPS capsule [id number] No TAKE 1 CAPSULE (50,000 UNITS TOTAL) BY MOUTH EVERY 7 (SEVEN) DAYS. Jerrol Banana., MD Taking Active           Patient Active Problem List   Diagnosis Date Noted  . Callus of foot 01/19/2021  . Pes planus 01/19/2021  . PTTD (posterior tibial tendon dysfunction) 01/19/2021  . Acute flank pain 12/15/2020  . Cystitis 12/15/2020  . Urinary symptom or sign 12/15/2020  . Low back pain radiating to right lower extremity 06/23/2020  . Gastroesophageal reflux disease 04/28/2020  . Large bowel obstruction ([place]) 04/17/2019  . Acquired trigger finger 03/20/2019  . Iron deficiency anemia 11/21/2018  . Syncope 03/18/2018  . Anemia 03/18/2018  . Exophthalmos 02/22/2018  . Multiple thyroid nodules 02/22/2018  . 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 Pain at surgical site 08/30/2016  . Ventral incisional hernia 08/21/2016  . SBO (small bowel obstruction) ([place])   . Partial small bowel obstruction ([place]) 04/23/2016  . Adjustment disorder with mixed anxiety and depressed mood 04/23/2016  . Memory difficulties 09/01/2015  . Arthritis 06/11/2015  . Back pain, chronic 06/11/2015  . HLD (hyperlipidemia) 06/11/2015  . Cannot sleep 06/11/2015  . L-S radiculopathy 06/11/2015  . Arthritis of knee, degenerative 06/11/2015  . B12 deficiency 06/11/2015  . 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 DM2 (diabetes mellitus, type 2) ([place]) 11/15/2012  . Adiposity 11/21/2009  . Avitaminosis D 11/07/2009  . Narrowing of intervertebral disc  space 07/25/2009  . Benign essential HTN 07/25/2009    Immunization History  Administered Date(s) Administered  . Fluad Quad(high Dose 65+) 08/15/2019, 09/01/2020  . Influenza Split 10/17/2010, 10/02/2011, 08/27/2012  . Influenza, High Dose Seasonal PF 08/29/2018  . Influenza,inj,Quad PF,6+ Mos 09/26/2013, 09/06/2014, 09/06/2016  . Influenza,inj,Quad PF,6-35 Mos 08/31/2017  . Influenza-Unspecified 09/15/2015  . PFIZER(Purple Top)SARS-COV-2 Vaccination 01/29/2020, 02/23/2020, 10/11/2020  . Pneumococcal Conjugate-13 08/29/2018  . Pneumococcal Polysaccharide-23 11/17/2012  . Tdap 10/02/2011, 09/27/2016  . Zoster 09/06/2016  . Zoster Recombinat (Shingrix) 08/29/2018, 08/15/2019    Conditions to be addressed/monitored:  Hypertension, Hyperlipidemia, Diabetes, GERD and Chronic Pain  Care Plan : General Pharmacy (Adult)  Updates made by Germaine Pomfret, RPH since 02/27/2021 12:00 AM    Problem: Hypertension, Hyperlipidemia, Diabetes, GERD and Chronic Pain   Priority: High    Long-Range Goal: Patient-Specific Goal   Start Date: 02/27/2021  Expected End Date: 08/30/2021  This Visit's Progress: On track  Priority: High  Note:   Current Barriers:  . Unable to achieve control of cholesterol  . Suboptimal therapeutic regimen for hypertension  Pharmacist Clinical Goal(s):  . patient will achieve control of cholesterol as evidenced by LDL less than 70 . maintain control of blood pressure as evidenced by BP less than 140/90 and improvement of dizziness symptoms  through collaboration with PharmD and provider.   Interventions: . 1:1 collaboration with Jerrol Banana., MD regarding development and update of comprehensive plan of care as evidenced by provider attestation and co-signature . Inter-disciplinary care team collaboration (see longitudinal plan of care) . Comprehensive medication review performed; medication list updated in electronic medical record  Hypertension (BP  goal <140/90) -Not ideally controlled -Current treatment: . HCTZ 25 mg daily  . Metoprolol XL 25 mg daily  -Medications previously tried: NA  -Current home readings: Systolic 60-454U  -Current dietary habits: Patient is not following any specific dietary regimen -Current exercise habits: Patient is not following any specific exerciseregimen -Reports hypotensive symptoms -Patient reports frequent dizziness, especially when bending over.  -Educated on Importance of home blood pressure monitoring; Symptoms of hypotension and importance of maintaining adequate hydration; -Counseled to monitor BP at home 2-3 times weekly, document, and provide log at future appointments  -Recommend decreasing HCTZ to 12.5 mg daily   Hyperlipidemia: (LDL goal < 70) -History of MI 1999 -Uncontrolled -Current treatment: . None -Medications previously tried: NA  -Discussed at length with patient the need to prevent future CV events, which patient was amenable to. Patient was agreeable to starting a statin to reduce her risk of heart disease.  -Educated on Benefits of statin for ASCVD risk reduction;  -Recommend starting atorvastatin 40 mg daily for secondary prevention.   Diabetes (A1c goal <7%) -Controlled -Current medications: Marland Kitchen Metformin 500 mg daily  -Medications previously tried: NA  -Current home glucose readings . fasting glucose: NA . post prandial glucose: NA -Denies hypoglycemic/hyperglycemic symptoms -Educated on Benefits of routine self-monitoring of blood sugar; Carbohydrate counting and/or plate method -Counseled to check feet daily and get yearly eye exams -Recommended new Rx for Metformin 500 mg daily to reflect current way patient is taking medication.  Cognitive Impairment (Goal: Prevent further decline in memory) -Managed by Dr. [name] -Uncontrolled -Current treatment  . Vitamin B12 1000 mcg injections monthly -Medications previously tried: NA -Neurology recommended folic acid  46m daily, was not started.  -Recommended starting folic acid 1 mg daily   Patient Goals/Self-Care Activities . patient will:  - check blood pressure 2-3 times weekly, document, and provide at future appointments target a minimum of 150 minutes of  moderate intensity exercise weekly  Follow Up Plan: Telephone follow up appointment with care management team member scheduled for: 05/29/2021 at 1:00 PM       Medication Assistance: None required.  Patient affirms current coverage meets needs.  Patient's preferred pharmacy is:  [id number] [place], [place]Phone: [phone]: [phone] Uses pill box? Yes Pt endorses 100% compliance  We discussed: Current pharmacy is preferred with insurance plan and patient is satisfied with pharmacy services Patient decided to: Continue current medication management strategy  Care Plan and Follow Up Patient Decision:  Patient agrees to Care Plan and Follow-up.  Plan: Telephone follow up appointment with care management team member scheduled for:  05/29/2021 at 1:00 PM  [phone]

## 2021-02-27 NOTE — Patient Instructions (Signed)
Visit Information It was great speaking with you today!  Please let me know if you have any questions about our visit.  Goals Addressed            This Visit's Progress   . Monitor and Manage My Blood Sugar-Diabetes Type 2       Timeframe:  Long-Range Goal Priority:  High Start Date:  02/27/2021                           Expected End Date: 08/30/2021                      Follow Up Date 04/15/2021    - check blood sugar at prescribed times - check blood sugar if I feel it is too high or too low    Why is this important?    Checking your blood sugar at home helps to keep it from getting very high or very low.   Writing the results in a diary or log helps the doctor know how to care for you.   Your blood sugar log should have the time, date and the results.   Also, write down the amount of insulin or other medicine that you take.   Other information, like what you ate, exercise done and how you were feeling, will also be helpful.     Notes:        Patient Care Plan: General Pharmacy (Adult)    Problem Identified: Hypertension, Hyperlipidemia, Diabetes, GERD and Chronic Pain   Priority: High    Long-Range Goal: Patient-Specific Goal   Start Date: 02/27/2021  Expected End Date: 08/30/2021  This Visit's Progress: On track  Priority: High  Note:   Current Barriers:  . Unable to achieve control of cholesterol  . Suboptimal therapeutic regimen for hypertension  Pharmacist Clinical Goal(s):  . patient will achieve control of cholesterol as evidenced by LDL less than 70 . maintain control of blood pressure as evidenced by BP less than 140/90 and improvement of dizziness symptoms  through collaboration with PharmD and provider.   Interventions: . 1:1 collaboration with Jerrol Banana., MD regarding development and update of comprehensive plan of care as evidenced by provider attestation and co-signature . Inter-disciplinary care team collaboration (see longitudinal  plan of care) . Comprehensive medication review performed; medication list updated in electronic medical record  Hypertension (BP goal <140/90) -Not ideally controlled -Current treatment: . HCTZ 25 mg daily  . Metoprolol XL 25 mg daily  -Medications previously tried: NA  -Current home readings: Systolic 95-093O  -Current dietary habits: Patient is not following any specific dietary regimen -Current exercise habits: Patient is not following any specific exerciseregimen -Reports hypotensive symptoms -Patient reports frequent dizziness, especially when bending over.  -Educated on Importance of home blood pressure monitoring; Symptoms of hypotension and importance of maintaining adequate hydration; -Counseled to monitor BP at home 2-3 times weekly, document, and provide log at future appointments  -Recommend decreasing HCTZ to 12.5 mg daily   Hyperlipidemia: (LDL goal < 70) -History of MI 1999 -Uncontrolled -Current treatment: . None -Medications previously tried: NA  -Discussed at length with patient the need to prevent future CV events, which patient was amenable to. Patient was agreeable to starting a statin to reduce her risk of heart disease.  -Educated on Benefits of statin for ASCVD risk reduction;  -Recommend starting atorvastatin 40 mg daily for secondary prevention.   Diabetes (A1c  goal <7%) -Controlled -Current medications: Marland Kitchen Metformin 500 mg daily  -Medications previously tried: NA  -Current home glucose readings . fasting glucose: NA . post prandial glucose: NA -Denies hypoglycemic/hyperglycemic symptoms -Educated on Benefits of routine self-monitoring of blood sugar; Carbohydrate counting and/or plate method -Counseled to check feet daily and get yearly eye exams -Recommended new Rx for Metformin 500 mg daily to reflect current way patient is taking medication.  Cognitive Impairment (Goal: Prevent further decline in memory) -Managed by Dr.  [name] -Uncontrolled -Current treatment  . Vitamin B12 1000 mcg injections monthly -Medications previously tried: NA -Neurology recommended folic acid 1mg  daily, was not started.  -Recommended starting folic acid 1 mg daily   Patient Goals/Self-Care Activities . patient will:  - check blood pressure 2-3 times weekly, document, and provide at future appointments target a minimum of 150 minutes of moderate intensity exercise weekly  Follow Up Plan: Telephone follow up appointment with care management team member scheduled for: 05/29/2021 at 1:00 PM     Patient agreed to services and verbal consent obtained.   The patient verbalized understanding of instructions, educational materials, and care plan provided today and declined offer to receive copy of patient instructions, educational materials, and care plan.   [place] [phone]

## 2021-03-01 DIAGNOSIS — E538 Deficiency of other specified B group vitamins: Secondary | ICD-10-CM | POA: Diagnosis not present

## 2021-03-01 IMAGING — CR RIGHT SHOULDER - 2+ VIEW
1 series · 3 of 3 positions shown · non-contrast
Comparison: None.

CLINICAL DATA: Pain for approximately 3 months

EXAM:
RIGHT SHOULDER - 2+ VIEW

[Series 1: dg shoulder right · 0.14mm/px · 3 of 3 slices shown]
[im 1/3]
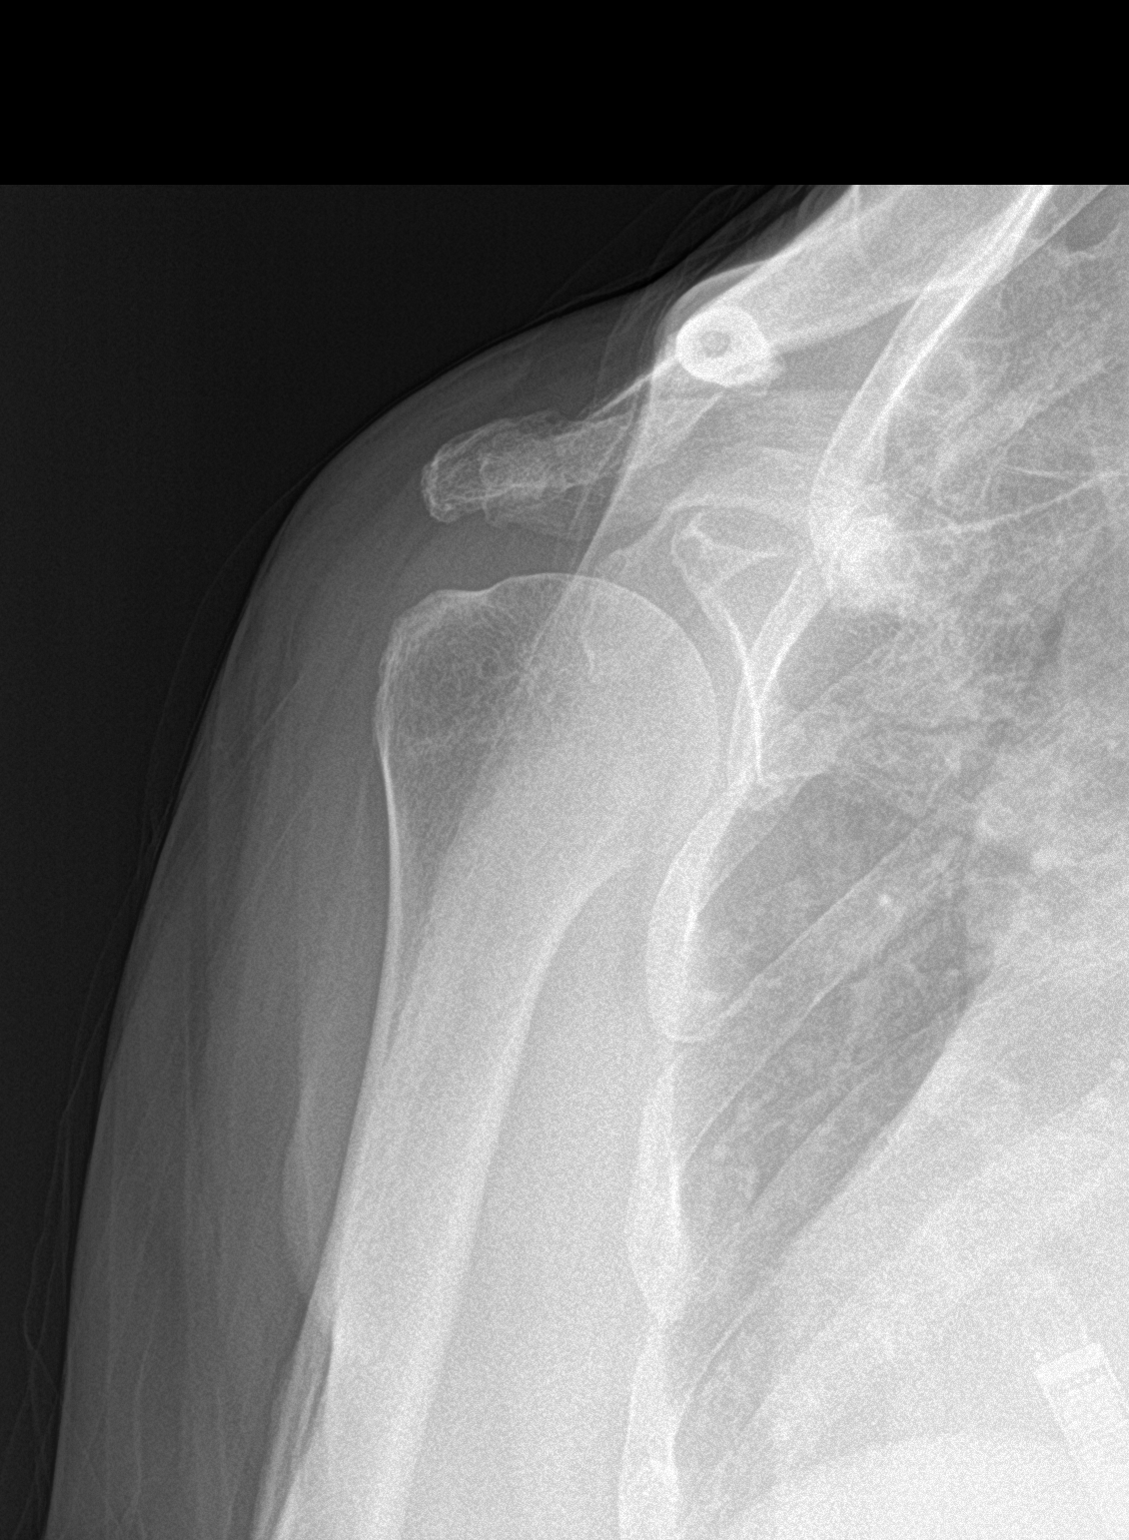
[im 2/3]
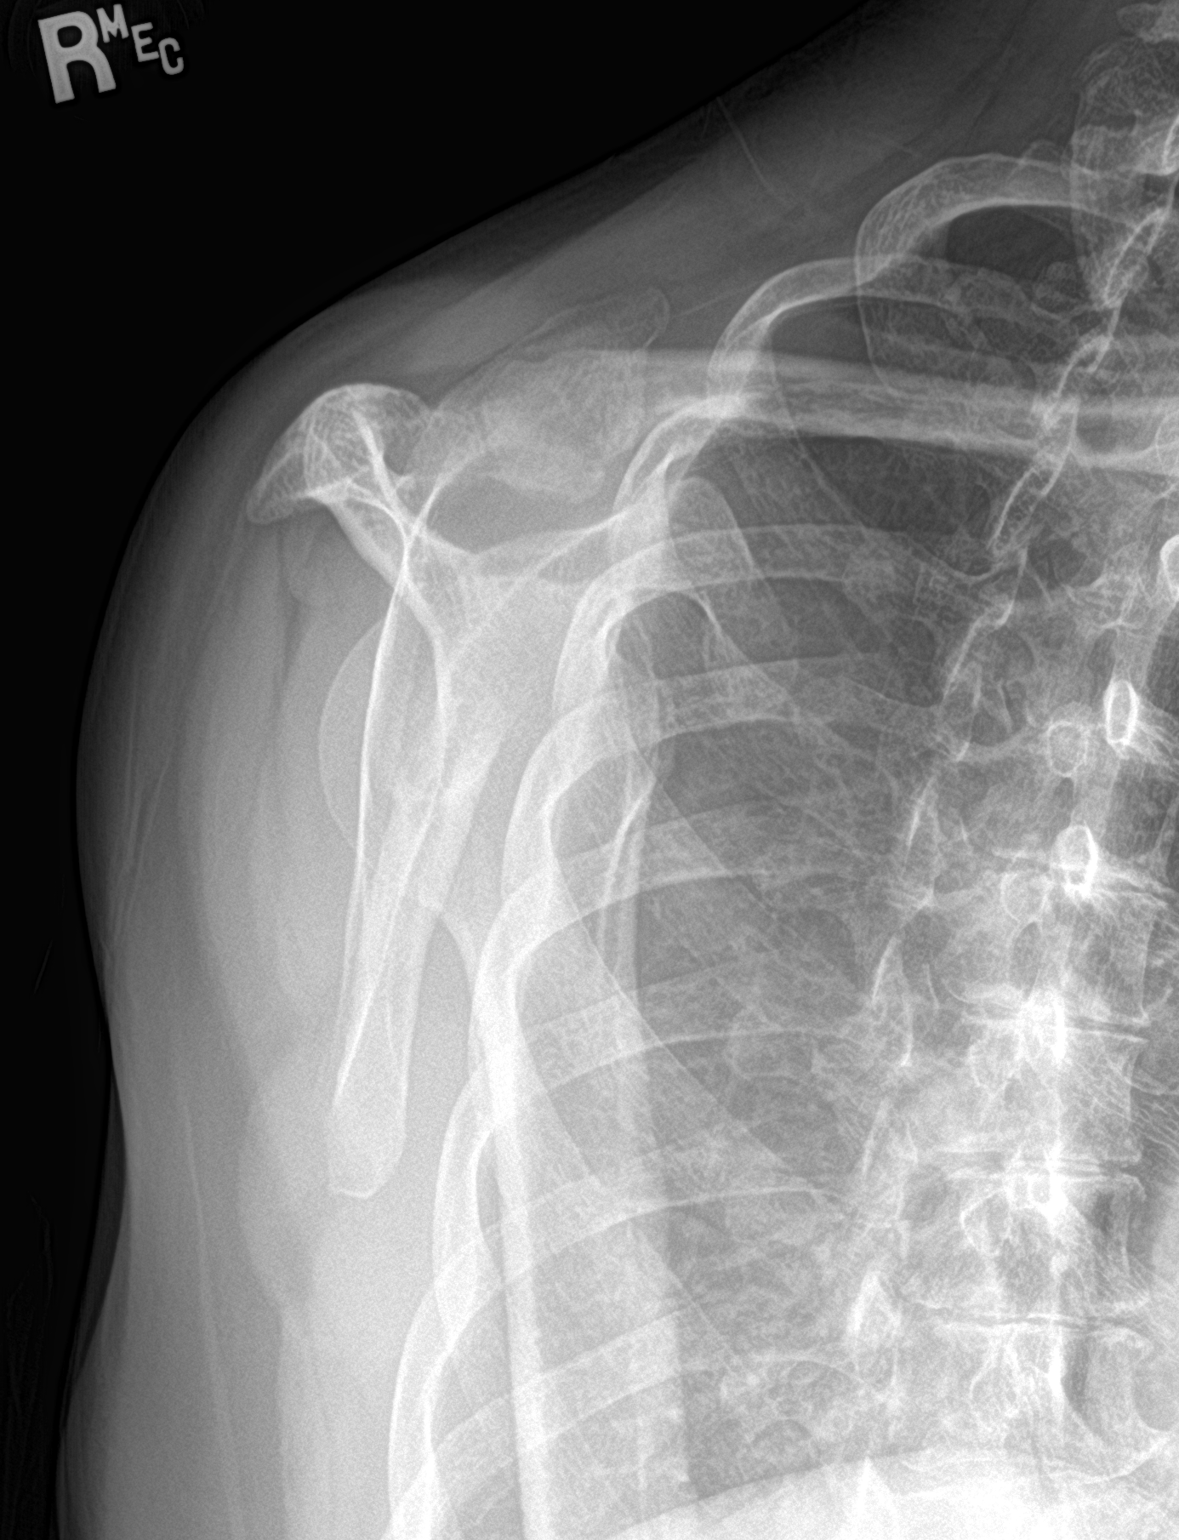
[im 3/3]
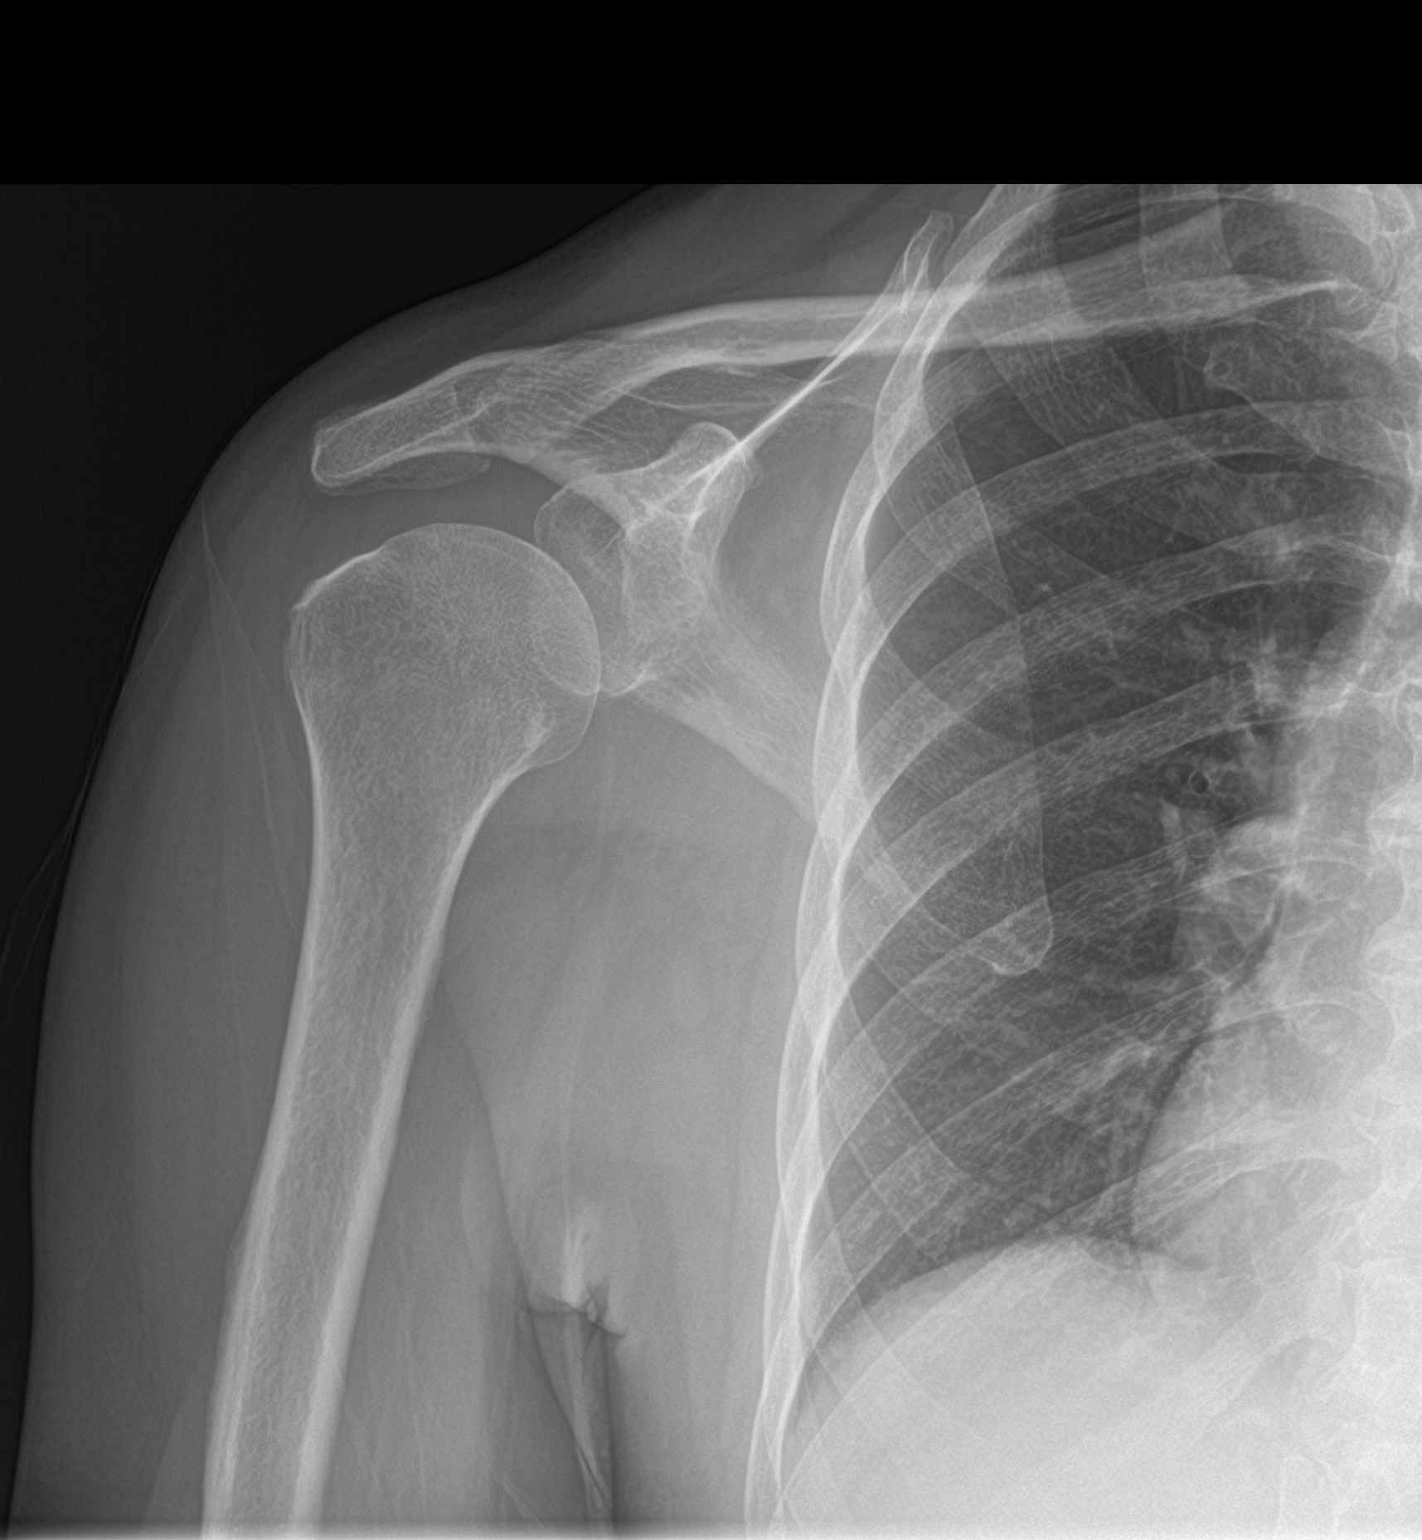

[3 of 3 positions shown; findings below may reference images not displayed]

FINDINGS: Oblique, frontal, and Y scapular images obtained. There is no acute
fracture or dislocation. There is mild generalized osteoarthritic
change. No erosive change or intra-articular calcification.
Visualized right lung clear.
IMPRESSION: Mild generalized joint space narrowing consistent with
osteoarthritis. No erosive change. No fracture or dislocation.

## 2021-03-03 ENCOUNTER — Other Ambulatory Visit: Payer: Self-pay | Admitting: Oncology

## 2021-03-06 ENCOUNTER — Other Ambulatory Visit: Payer: Self-pay

## 2021-03-06 ENCOUNTER — Encounter: Payer: Self-pay | Admitting: Family Medicine

## 2021-03-06 ENCOUNTER — Ambulatory Visit (INDEPENDENT_AMBULATORY_CARE_PROVIDER_SITE_OTHER): Payer: Medicare HMO | Admitting: Family Medicine

## 2021-03-06 VITALS — BP 100/66 | HR 74 | Temp 98.1°F | Resp 16 | Wt 133.0 lb

## 2021-03-06 DIAGNOSIS — E0821 Diabetes mellitus due to underlying condition with diabetic nephropathy: Secondary | ICD-10-CM | POA: Diagnosis not present

## 2021-03-06 DIAGNOSIS — N1831 Chronic kidney disease, stage 3a: Secondary | ICD-10-CM | POA: Diagnosis not present

## 2021-03-06 DIAGNOSIS — E782 Mixed hyperlipidemia: Secondary | ICD-10-CM | POA: Diagnosis not present

## 2021-03-06 DIAGNOSIS — R4189 Other symptoms and signs involving cognitive functions and awareness: Secondary | ICD-10-CM

## 2021-03-06 NOTE — Progress Notes (Signed)
I,Julie Acevedo,acting as a scribe for [name], MD.,have documented all relevant documentation on the behalf of [name], MD,as directed by  [name], MD while in the presence of [name], MD.   Established patient visit   Patient: Julie Acevedo   DOB: 1951-06-01   [name]  MRN: [id number] Visit Date: 03/06/2021  Today's healthcare provider: [name], MD   No chief complaint on file.  Subjective    HPI  Patient saw Dr. [name] 02/24/2021. Dr. [name] advised patient to have renal function panel checked by her pcp. She has a history of mild chronic kidney disease that is probably secondary to diabetes and hypertension.  Both blood pressure and diabetes have been well controlled for some time.  Overall the patient is feeling pretty well. He has 1 complaint today and that is recent dysphagia in the mid chest with food.  It is with any food.  It does not occur all the time.  No weight loss no hematemesis no melena hematochezia.     Medications: Outpatient Medications Prior to Visit  Medication Sig  . cyclobenzaprine (FLEXERIL) 5 MG tablet TAKE 1 TABLET BY MOUTH 3 TIMES DAILY AS NEEDED.  Marland Kitchen gabapentin (NEURONTIN) 100 MG capsule TAKE 2 CAPSULES (200 MG TOTAL) BY MOUTH 3 (THREE) TIMES DAILY. (Patient taking differently: Take 200 mg by mouth 2 (two) times daily.)  . hydrochlorothiazide (HYDRODIURIL) 25 MG tablet Take 25 mg by mouth daily.  Marland Kitchen LINZESS 290 MCG CAPS capsule TAKE 1 CAPSULE (290 MCG TOTAL) BY MOUTH DAILY BEFORE BREAKFAST.  . metFORMIN (GLUCOPHAGE) 500 MG tablet TAKE 1 TABLET (500 MG TOTAL) BY MOUTH 2 (TWO) TIMES DAILY WITH A MEAL. (Patient taking differently: Take 500 mg by mouth daily with breakfast.)  . methocarbamol (ROBAXIN) 500 MG tablet Take 500 mg by mouth 2 (two) times daily. Back pain  . metoprolol succinate (TOPROL-XL) 25 MG 24 hr tablet Take 25 mg by mouth daily.  Marland Kitchen omeprazole (PRILOSEC) 20 MG capsule TAKE 1 CAPSULE BY  MOUTH EVERY DAY  . ondansetron (ZOFRAN) 4 MG tablet Take 1 tablet (4 mg total) by mouth every 8 (eight) hours as needed.  . Prasterone (INTRAROSA) 6.5 MG INST Place 6.5 mg vaginally at bedtime.  . traMADol (ULTRAM) 50 MG tablet Take 1 tablet (50 mg total) by mouth every 6 (six) hours as needed.  . traZODone (DESYREL) 50 MG tablet TAKE 1 TABLET (50 MG TOTAL) BY MOUTH AT BEDTIME AS NEEDED.  . Vaginal Lubricant (REPLENS) GEL Place 1 Applicatorful vaginally 3 (three) times a week. (Patient not taking: No sig reported)   No facility-administered medications prior to visit.    Review of Systems  Constitutional: Negative for appetite change, chills, fatigue and fever.  Respiratory: Negative for chest tightness and shortness of breath.   Cardiovascular: Negative for chest pain and palpitations.  Gastrointestinal: Negative for abdominal pain, nausea and vomiting.  Neurological: Negative for dizziness and weakness.        Objective    BP 100/66 (BP Location: Left Arm, Patient Position: Sitting, Cuff Size: Normal)   Pulse 74   Temp 98.1 F (36.7 C) (Oral)   Resp 16   Wt 133 lb (60.3 kg)   SpO2 93%   BMI 23.56 kg/m  BP Readings from Last 3 Encounters:  03/06/21 100/66  02/08/21 120/80  12/15/20 (!) 103/58   Wt Readings from Last 3 Encounters:  03/06/21 133 lb (60.3 kg)  02/08/21  133 lb (60.3 kg)  12/15/20 133 lb 9.6 oz (60.6 kg)       Physical Exam Vitals reviewed.  Constitutional:      Appearance: She is well-developed.  HENT:     Head: Normocephalic and atraumatic.     Right Ear: External ear normal.     Left Ear: External ear normal.     Nose: Nose normal.  Eyes:     General: No scleral icterus.    Conjunctiva/sclera: Conjunctivae normal.     Pupils: Pupils are equal, round, and reactive to light.  Neck:     Thyroid: No thyromegaly.  Cardiovascular:     Rate and Rhythm: Normal rate and regular rhythm.     Heart sounds: Normal heart sounds.  Pulmonary:     Effort:  Pulmonary effort is normal.     Breath sounds: Normal breath sounds.  Abdominal:     Palpations: Abdomen is soft.  Lymphadenopathy:     Cervical: No cervical adenopathy.  Skin:    General: Skin is warm and dry.  Neurological:     General: No focal deficit present.     Mental Status: She is alert and oriented to person, place, and time.  Psychiatric:        Mood and Affect: Mood normal.        Behavior: Behavior normal.        Thought Content: Thought content normal.        Judgment: Judgment normal.       No results found for any visits on 03/06/21.  Assessment & Plan     1. Diabetes mellitus due to underlying condition with diabetic nephropathy, without long-term current use of insulin (HCC) Clinically stable.  Patient on Metformin.  Obtain A1c Follow renal function and work-up or refer as appropriate.  This appears to been stable for some time.  - Renal function panel - Hemoglobin A1c  2. Stage 3a chronic kidney disease ([place])   3. Cognitive impairment Work-up underway per Dr. [name].  4. Mixed hyperlipidemia   No follow-ups on file.      I, [name], MD, have reviewed all documentation for this visit. The documentation on 03/07/21 for the exam, diagnosis, procedures, and orders are all accurate and complete.    [name], MD  [facility] [phone] (phone) [phone] (fax)  [place]

## 2021-03-07 LAB — RENAL FUNCTION PANEL
Albumin: 3.9 g/dL (ref 3.8–4.8)
BUN/Creatinine Ratio: 13 (ref 12–28)
BUN: 21 mg/dL (ref 8–27)
CO2: 26 mmol/L (ref 20–29)
Calcium: 9.1 mg/dL (ref 8.7–10.3)
Chloride: 103 mmol/L (ref 96–106)
Creatinine, Ser: 1.64 mg/dL — ABNORMAL HIGH (ref 0.57–1.00)
Glucose: 74 mg/dL (ref 65–99)
Phosphorus: 2.9 mg/dL — ABNORMAL LOW (ref 3.0–4.3)
Potassium: 3.8 mmol/L (ref 3.5–5.2)
Sodium: 142 mmol/L (ref 134–144)
eGFR: 34 mL/min/{1.73_m2} — ABNORMAL LOW (ref >59)

## 2021-03-07 LAB — HEMOGLOBIN A1C
Est. average glucose Bld gHb Est-mCnc: 111 mg/dL
Hgb A1c MFr Bld: 5.5 % (ref 4.8–5.6)

## 2021-03-09 ENCOUNTER — Inpatient Hospital Stay: Payer: Medicare HMO | Attending: Oncology

## 2021-03-09 DIAGNOSIS — D509 Iron deficiency anemia, unspecified: Secondary | ICD-10-CM | POA: Insufficient documentation

## 2021-03-09 LAB — CBC WITH DIFFERENTIAL/PLATELET
Abs Immature Granulocytes: 0.02 10*3/uL (ref 0.00–0.07)
Basophils Absolute: 0 10*3/uL (ref 0.0–0.1)
Basophils Relative: 0 %
Eosinophils Absolute: 0.1 10*3/uL (ref 0.0–0.5)
Eosinophils Relative: 2 %
HCT: 34.4 % — ABNORMAL LOW (ref 36.0–46.0)
Hemoglobin: 11.1 g/dL — ABNORMAL LOW (ref 12.0–15.0)
Immature Granulocytes: 0 %
Lymphocytes Relative: 26 %
Lymphs Abs: 1.8 10*3/uL (ref 0.7–4.0)
MCH: 30.5 pg (ref 26.0–34.0)
MCHC: 32.3 g/dL (ref 30.0–36.0)
MCV: 94.5 fL (ref 80.0–100.0)
Monocytes Absolute: 0.5 10*3/uL (ref 0.1–1.0)
Monocytes Relative: 7 %
Neutro Abs: 4.5 10*3/uL (ref 1.7–7.7)
Neutrophils Relative %: 65 %
Platelets: 326 10*3/uL (ref 150–400)
RBC: 3.64 MIL/uL — ABNORMAL LOW (ref 3.87–5.11)
RDW: 13.2 % (ref 11.5–15.5)
WBC: 6.9 10*3/uL (ref 4.0–10.5)
nRBC: 0 % (ref 0.0–0.2)

## 2021-03-09 LAB — IRON AND TIBC
Iron: 53 ug/dL (ref 28–170)
Saturation Ratios: 24 % (ref 10.4–31.8)
TIBC: 221 ug/dL — ABNORMAL LOW (ref 250–450)
UIBC: 168 ug/dL

## 2021-03-09 LAB — FERRITIN: Ferritin: 221 ng/mL (ref 11–307)

## 2021-03-10 ENCOUNTER — Emergency Department: Payer: Medicare HMO

## 2021-03-10 ENCOUNTER — Inpatient Hospital Stay: Payer: Medicare HMO

## 2021-03-10 ENCOUNTER — Emergency Department
Admission: EM | Admit: 2021-03-10 | Discharge: 2021-03-10 | Disposition: A | Discharge status: Home / Self Care | Payer: Medicare HMO | Attending: Emergency Medicine | Admitting: Emergency Medicine

## 2021-03-10 ENCOUNTER — Other Ambulatory Visit: Payer: Self-pay

## 2021-03-10 ENCOUNTER — Inpatient Hospital Stay (HOSPITAL_BASED_OUTPATIENT_CLINIC_OR_DEPARTMENT_OTHER): Payer: Medicare HMO | Admitting: Oncology

## 2021-03-10 DIAGNOSIS — Z79899 Other long term (current) drug therapy: Secondary | ICD-10-CM | POA: Insufficient documentation

## 2021-03-10 DIAGNOSIS — E119 Type 2 diabetes mellitus without complications: Secondary | ICD-10-CM | POA: Insufficient documentation

## 2021-03-10 DIAGNOSIS — W57XXXA Bitten or stung by nonvenomous insect and other nonvenomous arthropods, initial encounter: Secondary | ICD-10-CM | POA: Insufficient documentation

## 2021-03-10 DIAGNOSIS — Z7984 Long term (current) use of oral hypoglycemic drugs: Secondary | ICD-10-CM | POA: Insufficient documentation

## 2021-03-10 DIAGNOSIS — Z8616 Personal history of COVID-19: Secondary | ICD-10-CM | POA: Insufficient documentation

## 2021-03-10 DIAGNOSIS — I10 Essential (primary) hypertension: Secondary | ICD-10-CM | POA: Diagnosis not present

## 2021-03-10 DIAGNOSIS — M25472 Effusion, left ankle: Secondary | ICD-10-CM

## 2021-03-10 DIAGNOSIS — R2242 Localized swelling, mass and lump, left lower limb: Secondary | ICD-10-CM | POA: Diagnosis not present

## 2021-03-10 DIAGNOSIS — S80862A Insect bite (nonvenomous), left lower leg, initial encounter: Secondary | ICD-10-CM | POA: Insufficient documentation

## 2021-03-10 DIAGNOSIS — D509 Iron deficiency anemia, unspecified: Secondary | ICD-10-CM

## 2021-03-10 DIAGNOSIS — M7989 Other specified soft tissue disorders: Secondary | ICD-10-CM | POA: Diagnosis not present

## 2021-03-10 NOTE — ED Triage Notes (Signed)
Pt states she noted small red mark to posterior left lower leg a few days ago. States thought it was insect mark, today she woke up with discoloration to anterior aspect of left foot.

## 2021-03-10 NOTE — Progress Notes (Signed)
[place]  Telephone:([phone]) [phone] Fax:(336) [phone]  ID: Julie Acevedo OB: 1951/06/23  MR#: [id number]  VQQ#:[id number]  Patient Care Team: Jerrol Banana., MD as PCP - General (Family Medicine) Yolonda Kida, MD as PCP - Cardiology (Cardiology) Lorelee Cover., MD as Consulting Physician (Ophthalmology) Bjorn Loser, MD as Consulting Physician (Urology) Bary Castilla, Forest Gleason, MD as Consulting Physician (General Surgery) Jules Husbands, MD as Consulting Physician (General Surgery) Gardiner Barefoot, DPM as Consulting Physician (Podiatry) Gae Dry, MD as Referring Physician (Obstetrics and Gynecology) Vladimir Crofts, MD as Consulting Physician (Neurology)   CHIEF COMPLAINT: Iron deficiency anemia.  I connected with Julie Acevedo on 03/10/21 at  1:00 PM EDT by telephone visit and verified that I am speaking with the correct person using two identifiers.   I discussed the limitations, risks, security and privacy concerns of performing an evaluation and management service by telemedicine and the availability of in-person appointments. I also discussed with the patient that there may be a patient responsible charge related to this service. The patient expressed understanding and agreed to proceed.   Patient's location: Home Provider's location: Clinic   INTERVAL HISTORY: Patient returns to clinic today for repeat laboratory work, further evaluation, and consideration of additional IV Feraheme.  She was last seen in clinic on 05/10/2020.  She last received IV Feraheme on 02/10/2020.  In the interim, she has done well.  She was evaluated by cardiology for a single syncopal episode.  She was worked up with loop recorder implant with no recurrence.  Reports occasional episodes of dizziness and lightheadedness with changes in her position.  She is followed closely by her PCP Dr. Rosanna Randy.  Most recently has been having some cognitive impairment and was  referred to neurology.  She is followed by podiatry.  Apparently this morning she was seen in the emergency room for foot pain and insect bite.  Work-up included x-ray of her ankle and a Doppler of her left leg given history of DVT.  Both imaging studies were negative.  Today, she endorses significant fatigue since she had Covid back in December.  Reports having to sit throughout the day to rest with frequent naps to complete normal ADLs.  She states "I just want to feel better".  She has occasional shortness of breath and intermittent heaviness in her chest.  She denies any respiratory concerns.  REVIEW OF SYSTEMS:   Review of Systems  Constitutional: Positive for malaise/fatigue. Negative for fever and weight loss.  Respiratory: Negative.  Negative for cough and shortness of breath.   Cardiovascular: Negative.  Negative for chest pain and leg swelling.  Gastrointestinal: Negative for abdominal pain, blood in stool and melena.  Genitourinary: Negative.  Negative for hematuria.  Musculoskeletal: Negative.  Negative for back pain.  Skin: Negative.  Negative for rash.  Neurological: Negative for dizziness, focal weakness, weakness and headaches.  Psychiatric/Behavioral: Negative.  The patient is not nervous/anxious.     As per HPI. Otherwise, a complete review of systems is negative.  PAST MEDICAL HISTORY: Past Medical History:  Diagnosis Date  . Anemia   . Arthritis   . COVID-19 11/19/2019  . Diverticulitis   . DM (diabetes mellitus) ([place])    typ e 2  . History of methicillin resistant staphylococcus aureus (MRSA) 2017  . HTN (hypertension)   . Hyperlipidemia   . Memory difficulties 09/01/2015  . Multiple thyroid nodules   . Myocardial infarction ([place]) 1995  . Short-term memory  loss     PAST SURGICAL HISTORY: Past Surgical History:  Procedure Laterality Date  . ABDOMINAL HYSTERECTOMY     due to endometriosis-1 ovary left  . BREAST EXCISIONAL BIOPSY Right 1971   neg  .  CARDIAC CATHETERIZATION  2000   no significant CAD per note of Dr Caryl Comes in 2013   . CHOLECYSTECTOMY    . CYSTOCELE REPAIR N/A 03/19/2017   Procedure: ANTERIOR REPAIR (CYSTOCELE);  Surgeon: Bjorn Loser, MD;  Location: [facility];  Service: Urology;  Laterality: N/A;  . CYSTOSCOPY N/A 03/19/2017   Procedure: CYSTOSCOPY;  Surgeon: Bjorn Loser, MD;  Location: [facility];  Service: Urology;  Laterality: N/A;  . EYE SURGERY    . FOOT SURGERY    . HAND SURGERY    . HEMICOLECTOMY    . INSERTION OF MESH N/A 08/21/2016   Procedure: INSERTION OF MESH;  Surgeon: Excell Seltzer, MD;  Location: [facility];  Service: General;  Laterality: N/A;  . INSERTION OF MESH N/A 12/08/2019   Procedure: INSERTION OF MESH;  Surgeon: Jules Husbands, MD;  Location: [facility];  Service: General;  Laterality: N/A;  . LAPAROSCOPIC LYSIS OF ADHESIONS N/A 08/21/2016   Procedure: LAPAROSCOPIC LYSIS OF ADHESIONS;  Surgeon: Excell Seltzer, MD;  Location: [facility];  Service: General;  Laterality: N/A;  . LAPAROSCOPIC REMOVAL ABDOMINAL MASS    . LAPAROTOMY N/A 04/16/2019   Procedure: EXPLORATORY LAPAROTOMY;  Surgeon: Benjamine Sprague, DO;  Location: [facility];  Service: General;  Laterality: N/A;  . LOOP RECORDER INSERTION N/A 03/21/2018   Procedure: LOOP RECORDER INSERTION;  Surgeon: Sanda Klein, MD;  Location: [facility];  Service: Cardiovascular;  Laterality: N/A;  . LUMBAR [place]    . ROUX-EN-Y GASTRIC BYPASS  2010   Dr Hassell Done  . TONSILLECTOMY    . UPPER GI ENDOSCOPY  01/12/16   normal larynx, normal esophagus. gastric bypass with a normal-sized pouch and intact staple line, normal examined jejunum, otherwise exam was normal  . VENTRAL HERNIA REPAIR N/A 08/21/2016   Procedure: LAPAROSCOPIC VENTRAL HERNIA;  Surgeon: Excell Seltzer, MD;  Location: [facility];  Service: General;  Laterality: N/A;  . XI ROBOTIC ASSISTED VENTRAL HERNIA N/A 12/08/2019   Procedure: XI ROBOTIC ASSISTED VENTRAL HERNIA;  Surgeon: Jules Husbands,  MD;  Location: [facility];  Service: General;  Laterality: N/A;    FAMILY HISTORY: Family History  Problem Relation Age of Onset  . Cancer Father 18       Lung  . Coronary artery disease Father 34  . COPD Mother   . Diabetes Mother   . Hypertension Mother   . Cancer Paternal Grandmother        Breast  . Breast cancer Paternal Grandmother   . Congestive Heart Failure Maternal Grandmother   . Emphysema Maternal Grandfather   . Heart attack Paternal Grandfather   . Cancer Paternal Aunt        Breast  . Breast cancer Paternal Aunt 44  . Breast cancer Paternal Aunt 24    ADVANCED DIRECTIVES (Y/N):  N  HEALTH MAINTENANCE: Social History   Tobacco Use  . Smoking status: Never Smoker  . Smokeless tobacco: Never Used  Vaping Use  . Vaping Use: Never used  Substance Use Topics  . Alcohol use: No  . Drug use: No     Colonoscopy:  PAP:  Bone density:  Lipid panel:  Allergies  Allergen Reactions  . Lotrel [Amlodipine Besy-Benazepril Hcl] Anaphylaxis  . Contrast Media [Iodinated Diagnostic  Agents] Swelling and Other (See Comments)    Reaction:  Neck swelling   . Niacin Hives  . Sulfa Antibiotics Hives    Current Outpatient Medications  Medication Sig Dispense Refill  . cyclobenzaprine (FLEXERIL) 5 MG tablet TAKE 1 TABLET BY MOUTH 3 TIMES DAILY AS NEEDED. 60 tablet 3  . gabapentin (NEURONTIN) 100 MG capsule TAKE 2 CAPSULES (200 MG TOTAL) BY MOUTH 3 (THREE) TIMES DAILY. (Patient taking differently: Take 200 mg by mouth 2 (two) times daily.) 540 capsule 1  . hydrochlorothiazide (HYDRODIURIL) 25 MG tablet Take 25 mg by mouth daily.    Marland Kitchen LINZESS 290 MCG CAPS capsule TAKE 1 CAPSULE (290 MCG TOTAL) BY MOUTH DAILY BEFORE BREAKFAST. 90 capsule 3  . metFORMIN (GLUCOPHAGE) 500 MG tablet TAKE 1 TABLET (500 MG TOTAL) BY MOUTH 2 (TWO) TIMES DAILY WITH A MEAL. (Patient taking differently: Take 500 mg by mouth daily with breakfast.) 180 tablet 3  . methocarbamol (ROBAXIN) 500 MG tablet  Take 500 mg by mouth 2 (two) times daily. Back pain    . metoprolol succinate (TOPROL-XL) 25 MG 24 hr tablet Take 25 mg by mouth daily.    Marland Kitchen omeprazole (PRILOSEC) 20 MG capsule TAKE 1 CAPSULE BY MOUTH EVERY DAY 90 capsule 1  . ondansetron (ZOFRAN) 4 MG tablet Take 1 tablet (4 mg total) by mouth every 8 (eight) hours as needed. 20 tablet 0  . Prasterone (INTRAROSA) 6.5 MG INST Place 6.5 mg vaginally at bedtime. 30 each 11  . traMADol (ULTRAM) 50 MG tablet Take 1 tablet (50 mg total) by mouth every 6 (six) hours as needed. 100 tablet 2  . traZODone (DESYREL) 50 MG tablet TAKE 1 TABLET (50 MG TOTAL) BY MOUTH AT BEDTIME AS NEEDED. 30 tablet 11  . Vaginal Lubricant (REPLENS) GEL Place 1 Applicatorful vaginally 3 (three) times a week. (Patient not taking: No sig reported) 35 g 11   No current facility-administered medications for this visit.    OBJECTIVE: There were no vitals filed for this visit.   There is no height or weight on file to calculate BMI.    ECOG FS:0 - Asymptomatic  Physical Exam Neurological:     Mental Status: She is alert and oriented to person, place, and time.     LAB RESULTS:  Lab Results  Component Value Date   NA 142 03/06/2021   K 3.8 03/06/2021   CL 103 03/06/2021   CO2 26 03/06/2021   GLUCOSE 74 03/06/2021   BUN 21 03/06/2021   CREATININE 1.64 (H) 03/06/2021   CALCIUM 9.1 03/06/2021   PROT 6.4 12/05/2020   ALBUMIN 3.9 03/06/2021   AST 15 12/05/2020   ALT 6 12/05/2020   ALKPHOS 191 (H) 12/05/2020   BILITOT 0.4 12/05/2020   GFRNONAA 36 (L) 12/05/2020   GFRAA 42 (L) 12/05/2020    Lab Results  Component Value Date   WBC 6.9 03/09/2021   NEUTROABS 4.5 03/09/2021   HGB 11.1 (L) 03/09/2021   HCT 34.4 (L) 03/09/2021   MCV 94.5 03/09/2021   PLT 326 03/09/2021    Lab Results  Component Value Date   IRON 53 03/09/2021   TIBC 221 (L) 03/09/2021   IRONPCTSAT 24 03/09/2021   Lab Results  Component Value Date   FERRITIN 221 03/09/2021     STUDIES: DG Ankle Complete Left  Result Date: 03/10/2021 CLINICAL DATA:  Swelling, discoloration, possible insect bite EXAM: LEFT ANKLE COMPLETE - 3+ VIEW COMPARISON:  None. FINDINGS: Soft tissue swelling about the  ankle is most pronounced laterally. No sizeable joint effusion. No soft tissue gas or foreign body. No acute bony abnormality. Specifically, no fracture, subluxation, or dislocation. Degenerative changes in the included ankle and hindfoot. Bidirectional calcaneal spurs. No conspicuous osseous lesions are radiographic features to suggest osteomyelitis. IMPRESSION: Soft tissue swelling about the ankle is most pronounced laterally. No soft tissue gas or foreign body. No acute osseous abnormality. Electronically Signed   By: Lovena Le M.D.   On: 03/10/2021 06:17   [name]  Result Date: 02/19/2021 CLINICAL DATA:  Cognitive impairment EXAM: MRI HEAD WITHOUT CONTRAST TECHNIQUE: Multiplanar, multiecho pulse sequences of the brain and surrounding structures were obtained without intravenous contrast. Additionally, using NeuroQuant software a 3D volumetric analysis of the brain was performed and is compared to a normative database adjusted for age, gender and intracranial volume. COMPARISON:  2016 FINDINGS: Brain: There is no acute infarction or intracranial hemorrhage. There is no intracranial mass, mass effect, or edema. There is no hydrocephalus or extra-axial fluid collection. Slight increase in prominence of the ventricles and sulci reflecting mild degree of interval parenchymal volume loss. Patchy small foci of T2 hyperintensity in the supratentorial white matter are nonspecific but probably reflects similar burden of mild chronic microvascular ischemic changes. Vascular: Major vessel flow voids at the skull base are preserved. Skull and upper cervical spine: Marrow signal within normal limits. Degenerative changes at the right temporomandibular joint. Sinuses/Orbits: Paranasal  sinuses are aerated. No significant orbital abnormality. Other: Sella is unremarkable.  Mastoid air cells are clear. NeuroQuant Findings: Volumetric analysis of the brain was performed, with a fully detailed report in [facility]. Briefly, the comparison with age and gender matched reference reveals hippocampi at the 7th percentile and whole brain volume at the 61st percentile. IMPRESSION: 1. No intracranial mass or acute abnormality. 2. Slight increase in parenchymal volume loss since 2016. Similar mild chronic microvascular ischemic changes. 3. NeuroQuant volumetric analysis of the brain, see details on PACS. Electronically Signed   By: Macy Mis M.D.   On: 02/19/2021 16:21   US Venous Img Lower Unilateral Left  Result Date: 03/10/2021 CLINICAL DATA:  Left leg swelling EXAM: LEFT LOWER EXTREMITY VENOUS DOPPLER ULTRASOUND TECHNIQUE: Gray-scale sonography with compression, as well as color and duplex ultrasound, were performed to evaluate the deep venous system(s) from the level of the common femoral vein through the popliteal and proximal calf veins. COMPARISON:  None. FINDINGS: VENOUS Normal compressibility of the common femoral, superficial femoral, and popliteal veins, as well as the visualized calf veins. Visualized portions of profunda femoral vein and great saphenous vein unremarkable. No filling defects to suggest DVT on grayscale or color Doppler imaging. Doppler waveforms show normal direction of venous flow, normal respiratory plasticity and response to augmentation. Limited views of the contralateral common femoral vein are unremarkable. OTHER None. Limitations: none IMPRESSION: Negative. Electronically Signed   By: Julian Hy M.D.   On: 03/10/2021 07:28   CUP PACEART REMOTE DEVICE CHECK  Result Date: 02/14/2021 ILR summary report received. Battery status OK. Normal device function. No new symptom, tachy, brady, or pause episodes. No new AF episodes. Monthly summary reports and  ROV/PRN   ASSESSMENT: Iron deficiency anemia.  PLAN:   1.  Iron deficiency anemia:  -Significant anemia back in 2018 when she was diagnosed with vaginal vault prolapse cystocele.  Hemoglobin between 8 and 9.  -Had colonoscopy in September 2019 which was reported as normal. -Initial work-up included hemolysis labs, B12, folate and SPEP which  were negative or within normal limits. -Hemoglobin has been stable between 10-11 for quite some time now. -She last received IV Feraheme in March 2021. -Labs from 03/09/2021 show a ferritin of 221 and iron saturations of 24%.  Her hemoglobin is 11.1. -Given she does have CKD, iron saturations should be closer to 30%.  She is having significant fatigue.  Recommend 1-2 doses of IV Feraheme to see if this improves her hemoglobin and symptoms.  Disposition: -IV Feraheme x2 doses -RTC in 3 to 4 months for repeat labs, MD assessment and possible IV Feraheme..JEB  I provided 15 minutes of non face-to-face telephone visit time during this encounter, and > 50% was spent counseling as documented under my assessment & plan.  Patient expressed understanding and was in agreement with this plan. She also understands that She can call clinic at any time with any questions, concerns, or complaints.    Jacquelin Hawking, NP   03/10/2021 1:09 PM

## 2021-03-10 NOTE — Discharge Instructions (Signed)
No signs of blood clot, infection, or fracture of your ankle. Follow up with your doctor.  Return to the hospital if you have redness, pain, or warmth of the skin around the ankle or if you have a fever.

## 2021-03-10 NOTE — ED Provider Notes (Signed)
[facility] Emergency Department Provider Note  ____________________________________________  Time seen: Approximately 6:37 AM  I have reviewed the triage vital signs and the nursing notes.   HISTORY  Chief Complaint Foot Pain   HPI Julie Acevedo is a 70 y.o. 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  Patient reports that 3 days ago she was in the shower when she noticed a insect bite on the posterior left lower leg.  This morning when she woke up she noticed some bruising of the left middle aspect of her ankle and also felt that the top of her foot was numb.  Because she is a diabetic she was very concerned about a possible infection from the mosquito bite and came to the hospital for evaluation.  She denies any pain on the ankle or the leg, no fever or chills, no redness or warmth.   Past Medical History:  Diagnosis Date  . Anemia   . Arthritis   . COVID-19 11/19/2019  . Diverticulitis   . DM (diabetes mellitus) ([place])    typ e 2  . History of methicillin resistant staphylococcus aureus (MRSA) 2017  . HTN (hypertension)   . Hyperlipidemia   . Memory difficulties 09/01/2015  . Multiple thyroid nodules   . Myocardial infarction ([place]) 1995  . Short-term memory loss     Patient Active Problem List   Diagnosis Date Noted  . Callus of foot 01/19/2021  . Pes planus 01/19/2021  . PTTD (posterior tibial tendon dysfunction) 01/19/2021  . Acute flank pain 12/15/2020  . Cystitis 12/15/2020  . Urinary symptom or sign 12/15/2020  . Low back pain radiating to right lower extremity 06/23/2020  . Gastroesophageal reflux disease 04/28/2020  . Large bowel obstruction ([place]) 04/17/2019  . Acquired trigger finger 03/20/2019  . Iron deficiency anemia 11/21/2018  . Syncope 03/18/2018  . Anemia 03/18/2018  . Exophthalmos 02/22/2018  . Multiple thyroid  nodules 02/22/2018  . 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 Pain at surgical site 08/30/2016  . Ventral incisional hernia 08/21/2016  . SBO (small bowel obstruction) ([place])   . Partial small bowel obstruction ([place]) 04/23/2016  . Adjustment disorder with mixed anxiety and depressed mood 04/23/2016  . Memory difficulties 09/01/2015  . Arthritis 06/11/2015  . Back pain, chronic 06/11/2015  . HLD (hyperlipidemia) 06/11/2015  . Cannot sleep 06/11/2015  . L-S radiculopathy 06/11/2015  . Arthritis of knee, degenerative 06/11/2015  . B12 deficiency 06/11/2015  . 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 DM2 (diabetes mellitus, type 2) ([place]) 11/15/2012  . Adiposity 11/21/2009  . Avitaminosis D 11/07/2009  . Narrowing of intervertebral disc space 07/25/2009  . Benign essential HTN 07/25/2009    Past Surgical History:  Procedure Laterality Date  . ABDOMINAL HYSTERECTOMY     due to endometriosis-1 ovary left  . BREAST EXCISIONAL BIOPSY Right 1971   neg  . CARDIAC CATHETERIZATION  2000   no significant CAD per note of Dr Caryl Comes in 2013   . CHOLECYSTECTOMY    . CYSTOCELE REPAIR N/A 03/19/2017   Procedure: ANTERIOR REPAIR (CYSTOCELE);  Surgeon: Bjorn Loser, MD;  Location: [facility];  Service: Urology;  Laterality: N/A;  . CYSTOSCOPY N/A 03/19/2017   Procedure: CYSTOSCOPY;  Surgeon: Bjorn Loser, MD;  Location: [facility];  Service: Urology;  Laterality: N/A;  . EYE SURGERY    .  FOOT SURGERY    . HAND SURGERY    . HEMICOLECTOMY    . INSERTION OF MESH N/A 08/21/2016   Procedure: INSERTION OF MESH;  Surgeon: Excell Seltzer, MD;  Location: [facility];  Service: General;  Laterality: N/A;  . INSERTION OF MESH N/A 12/08/2019   Procedure: INSERTION OF MESH;  Surgeon: Jules Husbands, MD;  Location: [facility];  Service: General;  Laterality: N/A;  . LAPAROSCOPIC LYSIS OF ADHESIONS N/A 08/21/2016   Procedure: LAPAROSCOPIC LYSIS OF ADHESIONS;   Surgeon: Excell Seltzer, MD;  Location: [facility];  Service: General;  Laterality: N/A;  . LAPAROSCOPIC REMOVAL ABDOMINAL MASS    . LAPAROTOMY N/A 04/16/2019   Procedure: EXPLORATORY LAPAROTOMY;  Surgeon: Benjamine Sprague, DO;  Location: [facility];  Service: General;  Laterality: N/A;  . LOOP RECORDER INSERTION N/A 03/21/2018   Procedure: LOOP RECORDER INSERTION;  Surgeon: Sanda Klein, MD;  Location: [facility];  Service: Cardiovascular;  Laterality: N/A;  . LUMBAR [place]    . ROUX-EN-Y GASTRIC BYPASS  2010   Dr Hassell Done  . TONSILLECTOMY    . UPPER GI ENDOSCOPY  01/12/16   normal larynx, normal esophagus. gastric bypass with a normal-sized pouch and intact staple line, normal examined jejunum, otherwise exam was normal  . VENTRAL HERNIA REPAIR N/A 08/21/2016   Procedure: LAPAROSCOPIC VENTRAL HERNIA;  Surgeon: Excell Seltzer, MD;  Location: [facility];  Service: General;  Laterality: N/A;  . XI ROBOTIC ASSISTED VENTRAL HERNIA N/A 12/08/2019   Procedure: XI ROBOTIC ASSISTED VENTRAL HERNIA;  Surgeon: Jules Husbands, MD;  Location: [facility];  Service: General;  Laterality: N/A;    Prior to Admission medications   Medication Sig Start Date End Date Taking? Authorizing Provider  cyclobenzaprine (FLEXERIL) 5 MG tablet TAKE 1 TABLET BY MOUTH 3 TIMES DAILY AS NEEDED. 12/19/20   Jerrol Banana., MD  gabapentin (NEURONTIN) 100 MG capsule TAKE 2 CAPSULES (200 MG TOTAL) BY MOUTH 3 (THREE) TIMES DAILY. Patient taking differently: Take 200 mg by mouth 2 (two) times daily. 01/24/21   Jerrol Banana., MD  hydrochlorothiazide (HYDRODIURIL) 25 MG tablet Take 25 mg by mouth daily.    [provider]  LINZESS 290 MCG CAPS capsule TAKE 1 CAPSULE (290 MCG TOTAL) BY MOUTH DAILY BEFORE BREAKFAST. 03/16/20   Jerrol Banana., MD  metFORMIN (GLUCOPHAGE) 500 MG tablet TAKE 1 TABLET (500 MG TOTAL) BY MOUTH 2 (TWO) TIMES DAILY WITH A MEAL. Patient taking differently: Take 500 mg by mouth  daily with breakfast. 01/10/21   Jerrol Banana., MD  methocarbamol (ROBAXIN) 500 MG tablet Take 500 mg by mouth 2 (two) times daily. Back pain 05/22/19   [provider]  metoprolol succinate (TOPROL-XL) 25 MG 24 hr tablet Take 25 mg by mouth daily. 04/28/20   [provider]  omeprazole (PRILOSEC) 20 MG capsule TAKE 1 CAPSULE BY MOUTH EVERY DAY 11/18/20   Jerrol Banana., MD  ondansetron (ZOFRAN) 4 MG tablet Take 1 tablet (4 mg total) by mouth every 8 (eight) hours as needed. 01/17/21   Pabon, Diego F, MD  Prasterone (INTRAROSA) 6.5 MG INST Place 6.5 mg vaginally at bedtime. 02/08/21   Gae Dry, MD  traMADol (ULTRAM) 50 MG tablet Take 1 tablet (50 mg total) by mouth every 6 (six) hours as needed. 01/13/21   Jerrol Banana., MD  traZODone (DESYREL) 50 MG tablet TAKE 1 TABLET (50 MG TOTAL) BY MOUTH AT BEDTIME AS  NEEDED. 08/17/20   Jerrol Banana., MD  Vaginal Lubricant (REPLENS) GEL Place 1 Applicatorful vaginally 3 (three) times a week. Patient not taking: No sig reported 11/30/20   Gae Dry, MD  diphenhydrAMINE (BENADRYL) 50 MG tablet Patient is to take one tablet 1 hour before test. Patient not taking: Reported on 04/28/2020 04/04/20 05/15/20  Jules Husbands, MD    Allergies Lotrel [amlodipine besy-benazepril hcl], Contrast media [iodinated diagnostic agents], Niacin, and Sulfa antibiotics  Family History  Problem Relation Age of Onset  . Cancer Father 23       Lung  . Coronary artery disease Father 67  . COPD Mother   . Diabetes Mother   . Hypertension Mother   . Cancer Paternal Grandmother        Breast  . Breast cancer Paternal Grandmother   . Congestive Heart Failure Maternal Grandmother   . Emphysema Maternal Grandfather   . Heart attack Paternal Grandfather   . Cancer Paternal Aunt        Breast  . Breast cancer Paternal Aunt 7  . Breast cancer Paternal Aunt 28    Social History Social History   Tobacco Use  . Smoking  status: Never Smoker  . Smokeless tobacco: Never Used  Vaping Use  . Vaping Use: Never used  Substance Use Topics  . Alcohol use: No  . Drug use: No    Review of Systems  Constitutional: Negative for fever. Eyes: Negative for visual changes. ENT: Negative for sore throat. Neck: No neck pain  Cardiovascular: Negative for chest pain. Respiratory: Negative for shortness of breath. Gastrointestinal: Negative for abdominal pain, vomiting or diarrhea. Genitourinary: Negative for dysuria. Musculoskeletal: Negative for back pain. + LLE pain, swelling Skin: Negative for rash. Neurological: Negative for headaches, weakness or numbness. Psych: No SI or HI  ____________________________________________   PHYSICAL EXAM:  VITAL SIGNS: ED Triage Vitals [03/10/21 0539]  Enc Vitals Group     BP 123/64     Pulse Rate 79     Resp 20     Temp 98.7 F (37.1 C)     Temp Source Oral     SpO2 100 %     Weight 131 lb (59.4 kg)     Height 5\' 3"  (1.6 m)     Head Circumference      Peak Flow      Pain Score 0     Pain Loc      Pain Edu?      Excl. in [place]?     Constitutional: Alert and oriented. Well appearing and in no apparent distress. HEENT:      Head: Normocephalic and atraumatic.         Eyes: Conjunctivae are normal. Sclera is non-icteric.       Mouth/Throat: Mucous membranes are moist.       Neck: Supple with no signs of meningismus. Cardiovascular: Regular rate and rhythm. No murmurs, gallops, or rubs. Respiratory: Normal respiratory effort. Lungs are clear to auscultation bilaterally.  Gastrointestinal: Soft, non tender. Musculoskeletal:  There is a well healed insect bite on the left posterior lower leg with no signs of cellulitis, there is minimal bruising on the medial aspect of the L ankle with no tenderness or deformity, mild swelling of the ankle which is similar to R ankle.  no erythema or warmth, full painless range of motion of the ankle, strong DP and PT pulses with  brisk capillary refill.  Normal sensation to touch bilaterally.  Neurologic: Normal speech and language. Face is symmetric. Moving all extremities. No gross focal neurologic deficits are appreciated. Skin: Skin is warm, dry and intact. No rash noted. Psychiatric: Mood and affect are normal. Speech and behavior are normal.  ____________________________________________   LABS (all labs ordered are listed, but only abnormal results are displayed)  Labs Reviewed - No data to display ____________________________________________  EKG  none  ____________________________________________  RADIOLOGY  I have personally reviewed the images performed during this visit and I agree with the Radiologist's read.   Interpretation by Radiologist:  DG Ankle Complete Left  Result Date: 03/10/2021 CLINICAL DATA:  Swelling, discoloration, possible insect bite EXAM: LEFT ANKLE COMPLETE - 3+ VIEW COMPARISON:  None. FINDINGS: Soft tissue swelling about the ankle is most pronounced laterally. No sizeable joint effusion. No soft tissue gas or foreign body. No acute bony abnormality. Specifically, no fracture, subluxation, or dislocation. Degenerative changes in the included ankle and hindfoot. Bidirectional calcaneal spurs. No conspicuous osseous lesions are radiographic features to suggest osteomyelitis. IMPRESSION: Soft tissue swelling about the ankle is most pronounced laterally. No soft tissue gas or foreign body. No acute osseous abnormality. Electronically Signed   By: Lovena Le M.D.   On: 03/10/2021 06:17     ____________________________________________   PROCEDURES  Procedure(s) performed: None Procedures Critical Care performed:  None ____________________________________________   INITIAL IMPRESSION / ASSESSMENT AND PLAN / ED COURSE  70 y.o. 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   Patient has evidence of a insect bite in the left lower posterior leg which is well-healing with no signs of abscess or infection.  She does have mild diffuse swelling of the ankle which is present on both legs and symmetric.  She has full painless range of motion of the ankle, strong equal pulses, normal sensation to the touch, normal strength on dorsiflexion and plantarflexion.  There is no signs of cellulitis, inflammatory or infectious arthritis, fracture or dislocation.  There is no asymmetric swelling but since patient has a history of prior PE we will do a Doppler to rule out DVT.  X-ray of the ankle is unremarkable other than mild swelling which is seen on both ankles symmetrically.  _________________________ 6:57 AM on 03/10/2021 -----------------------------------------  [place] pending. Care transferred to incoming MD at [facility].     _____________________________________________ Please note:  Patient was evaluated in Emergency Department today for the symptoms described in the history of present illness. Patient was evaluated in the context of the global COVID-19 pandemic, which necessitated consideration that the patient might be at risk for infection with the SARS-CoV-2 virus that causes COVID-19. Institutional protocols and algorithms that pertain to the evaluation of patients at risk for COVID-19 are in a state of rapid change based on information released by regulatory bodies including the CDC and federal and state organizations. These policies and algorithms were followed during the patient's care in the ED.  Some ED evaluations and interventions may be delayed as a result of limited staffing during the pandemic.   [place] Controlled Substance Database was reviewed by me. ____________________________________________   FINAL CLINICAL IMPRESSION(S) / ED DIAGNOSES   Final diagnoses:  Left ankle swelling      NEW MEDICATIONS STARTED DURING THIS VISIT:  ED Discharge Orders    None        Note:  This document was prepared using Dragon voice recognition software and may include unintentional dictation errors.  Rudene Re, MD 03/10/21 [phone]

## 2021-03-10 NOTE — ED Notes (Signed)
US at bedside

## 2021-03-14 ENCOUNTER — Other Ambulatory Visit: Payer: Self-pay | Admitting: *Deleted

## 2021-03-14 ENCOUNTER — Inpatient Hospital Stay: Payer: Medicare HMO

## 2021-03-14 VITALS — BP 113/68 | HR 65 | Temp 98.2°F | Resp 18

## 2021-03-14 DIAGNOSIS — N1831 Chronic kidney disease, stage 3a: Secondary | ICD-10-CM

## 2021-03-14 DIAGNOSIS — D509 Iron deficiency anemia, unspecified: Secondary | ICD-10-CM

## 2021-03-14 MED ORDER — IRON SUCROSE 20 MG/ML IV SOLN
200.0000 mg | Freq: Once | INTRAVENOUS | Status: AC
  Administered 2021-03-14: 200 mg via INTRAVENOUS
  Filled 2021-03-14: qty 10

## 2021-03-14 MED ORDER — SODIUM CHLORIDE 0.9 % IV SOLN
Freq: Once | INTRAVENOUS | Status: AC
  Filled 2021-03-14: qty 250

## 2021-03-14 MED ORDER — SODIUM CHLORIDE 0.9 % IV SOLN: 200.0000 mg | Freq: Once | INTRAVENOUS | Status: DC

## 2021-03-14 NOTE — Progress Notes (Signed)
Pt received IV venofer in clinic today. Tolerated well. VSS @ d/c. 

## 2021-03-16 ENCOUNTER — Inpatient Hospital Stay: Payer: Medicare HMO

## 2021-03-16 VITALS — BP 103/59 | HR 60 | Temp 98.1°F | Resp 18

## 2021-03-16 DIAGNOSIS — D509 Iron deficiency anemia, unspecified: Secondary | ICD-10-CM | POA: Diagnosis not present

## 2021-03-16 MED ORDER — IRON SUCROSE 20 MG/ML IV SOLN
200.0000 mg | Freq: Once | INTRAVENOUS | Status: AC
  Administered 2021-03-16: 200 mg via INTRAVENOUS
  Filled 2021-03-16: qty 10

## 2021-03-16 MED ORDER — SODIUM CHLORIDE 0.9 % IV SOLN
Freq: Once | INTRAVENOUS | Status: AC
  Filled 2021-03-16: qty 250

## 2021-03-16 MED ORDER — SODIUM CHLORIDE 0.9 % IV SOLN: 200.0000 mg | Freq: Once | INTRAVENOUS | Status: DC

## 2021-03-17 DIAGNOSIS — E785 Hyperlipidemia, unspecified: Secondary | ICD-10-CM | POA: Insufficient documentation

## 2021-03-20 ENCOUNTER — Telehealth: Payer: Self-pay | Admitting: Family Medicine

## 2021-03-20 ENCOUNTER — Other Ambulatory Visit: Payer: Self-pay

## 2021-03-20 MED ORDER — ONDANSETRON HCL 4 MG PO TABS: 4.0000 mg | ORAL_TABLET | Freq: Three times a day (TID) | ORAL | 0 refills | Status: DC | PRN

## 2021-03-20 NOTE — Telephone Encounter (Signed)
[facility] pharmacy called requesting  BD Alcohol swabs, accuchek glucose meter and accuchek test strips for the pt. Please advise.

## 2021-03-21 ENCOUNTER — Other Ambulatory Visit: Payer: Self-pay

## 2021-03-21 ENCOUNTER — Encounter: Payer: Self-pay | Admitting: Podiatry

## 2021-03-21 ENCOUNTER — Ambulatory Visit: Payer: Medicare HMO | Admitting: Family Medicine

## 2021-03-21 ENCOUNTER — Ambulatory Visit (INDEPENDENT_AMBULATORY_CARE_PROVIDER_SITE_OTHER): Payer: Medicare HMO | Admitting: Podiatry

## 2021-03-21 DIAGNOSIS — M7752 Other enthesopathy of left foot: Secondary | ICD-10-CM | POA: Diagnosis not present

## 2021-03-21 DIAGNOSIS — Q828 Other specified congenital malformations of skin: Secondary | ICD-10-CM | POA: Diagnosis not present

## 2021-03-21 NOTE — Telephone Encounter (Signed)
We received a fax for requested rx's. Will fax back.

## 2021-03-22 ENCOUNTER — Encounter: Payer: Self-pay | Admitting: Podiatry

## 2021-03-22 NOTE — Progress Notes (Signed)
Subjective:  Patient ID: Julie Acevedo, female    DOB: [birth year],  MRN: [id number]  Chief Complaint  Patient presents with  . Callouses    Patient presents today for painful callous bottom of left 5th met    70 y.o. female presents with the above complaint.  Patient presents with complaint of left submetatarsal 1 benign skin lesion/porokeratosis.  Patient states it painful to touch.  She states it also hurts of the first MPJ with range of motion as well.  She states it hurts with ambulation.  She has not seen anyone else prior to seeing me.  She had the calluses trimmed down in the past which helped a lot by Dr. Milinda Pointer.  She denies any other acute complaints.   Review of Systems: Negative except as noted in the HPI. Denies N/V/F/Ch.  Past Medical History:  Diagnosis Date  . Anemia   . Arthritis   . COVID-19 11/19/2019  . Diverticulitis   . DM (diabetes mellitus) ([place])    typ e 2  . History of methicillin resistant staphylococcus aureus (MRSA) 2017  . HTN (hypertension)   . Hyperlipidemia   . Memory difficulties 09/01/2015  . Multiple thyroid nodules   . Myocardial infarction ([place]) 1995  . Short-term memory loss     Current Outpatient Medications:  .  cyclobenzaprine (FLEXERIL) 5 MG tablet, TAKE 1 TABLET BY MOUTH 3 TIMES DAILY AS NEEDED., Disp: 60 tablet, Rfl: 3 .  gabapentin (NEURONTIN) 100 MG capsule, TAKE 2 CAPSULES (200 MG TOTAL) BY MOUTH 3 (THREE) TIMES DAILY. (Patient taking differently: Take 200 mg by mouth 2 (two) times daily.), Disp: 540 capsule, Rfl: 1 .  hydrochlorothiazide (HYDRODIURIL) 25 MG tablet, Take 25 mg by mouth daily., Disp: , Rfl:  .  LINZESS 290 MCG CAPS capsule, TAKE 1 CAPSULE (290 MCG TOTAL) BY MOUTH DAILY BEFORE BREAKFAST., Disp: 90 capsule, Rfl: 3 .  metFORMIN (GLUCOPHAGE) 500 MG tablet, TAKE 1 TABLET (500 MG TOTAL) BY MOUTH 2 (TWO) TIMES DAILY WITH A MEAL. (Patient taking differently: Take 500 mg by mouth daily with breakfast.), Disp: 180 tablet, Rfl:  3 .  methocarbamol (ROBAXIN) 500 MG tablet, Take 500 mg by mouth 2 (two) times daily. Back pain, Disp: , Rfl:  .  metoprolol succinate (TOPROL-XL) 25 MG 24 hr tablet, Take 25 mg by mouth daily., Disp: , Rfl:  .  omeprazole (PRILOSEC) 20 MG capsule, TAKE 1 CAPSULE BY MOUTH EVERY DAY, Disp: 90 capsule, Rfl: 1 .  ondansetron (ZOFRAN) 4 MG tablet, Take 1 tablet (4 mg total) by mouth every 8 (eight) hours as needed., Disp: 20 tablet, Rfl: 0 .  Prasterone (INTRAROSA) 6.5 MG INST, Place 6.5 mg vaginally at bedtime., Disp: 30 each, Rfl: 11 .  traMADol (ULTRAM) 50 MG tablet, Take 1 tablet (50 mg total) by mouth every 6 (six) hours as needed., Disp: 100 tablet, Rfl: 2 .  traZODone (DESYREL) 50 MG tablet, TAKE 1 TABLET (50 MG TOTAL) BY MOUTH AT BEDTIME AS NEEDED., Disp: 30 tablet, Rfl: 11 .  Vaginal Lubricant (REPLENS) GEL, Place 1 Applicatorful vaginally 3 (three) times a week. (Patient not taking: No sig reported), Disp: 35 g, Rfl: 11  Social History   Tobacco Use  Smoking Status Never Smoker  Smokeless Tobacco Never Used    Allergies  Allergen Reactions  . Lotrel [Amlodipine Besy-Benazepril Hcl] Anaphylaxis  . Contrast Media [Iodinated Diagnostic Agents] Swelling and Other (See Comments)    Reaction:  Neck swelling   . Niacin Hives  .  Sulfa Antibiotics Hives   Objective:  There were no vitals filed for this visit. There is no height or weight on file to calculate BMI. Constitutional Well developed. Well nourished.  Vascular Dorsalis pedis pulses palpable bilaterally. Posterior tibial pulses palpable bilaterally. Capillary refill normal to all digits.  No cyanosis or clubbing noted. Pedal hair growth normal.  Neurologic Normal speech. Oriented to person, place, and time. Epicritic sensation to light touch grossly present bilaterally.  Dermatologic  pain on palpation to left first metatarsophalangeal joint.  Pain with range of motion left first MPJ.   porokeratosis noted at the first MPJ  as well which includes hyperkeratotic lesion with central nucleated core.  Pain on palpation to the lesion  Orthopedic: Normal joint ROM without pain or crepitus bilaterally. No visible deformities. No bony tenderness.   Radiographs: None Assessment:   1. Porokeratosis   2. Capsulitis of metatarsophalangeal (MTP) joint of left foot    Plan:  Patient was evaluated and treated and all questions answered.  Left submetatarsal 1 porokeratosis with underlying capsulitis of the first MPJ -I explained the patient the etiology of porokeratosis emergency room and options were extensively discussed.  Given the amount of pain she is having I believe patient will benefit from a steroid injection to help decrease acute inflammatory component with pain.  I also believe she will benefit from debridement of the lesion followed by excision of the central nucleated core.  Patient agrees with the plan would like to proceed with the procedure as well. -A steroid injection was performed at left first MPJ using 1% plain Lidocaine and 10 mg of Kenalog. This was well tolerated. -Using chisel blade to handle the hyperkeratotic lesion was debrided down to healthy striated tissue followed by excision of central nucleated core.  Immediate pain relief was noted after the removal.  No complication noted.   No follow-ups on file.

## 2021-03-27 ENCOUNTER — Ambulatory Visit: Payer: Medicare HMO | Admitting: Family Medicine

## 2021-03-29 ENCOUNTER — Telehealth: Payer: Self-pay

## 2021-03-29 NOTE — Telephone Encounter (Signed)
Pt calling to ask [place] if there is anything he can rx to help her; still hurting; still bleeding; still not opening up like she should; sex is unbearable.  [phone]

## 2021-03-30 NOTE — Telephone Encounter (Signed)
Pt aware and saddened.

## 2021-04-04 ENCOUNTER — Emergency Department: Payer: Medicare HMO

## 2021-04-04 ENCOUNTER — Other Ambulatory Visit: Payer: Self-pay | Admitting: Family Medicine

## 2021-04-04 ENCOUNTER — Emergency Department
Admission: EM | Admit: 2021-04-04 | Discharge: 2021-04-04 | Disposition: A | Discharge status: Home / Self Care | Payer: Medicare HMO | Attending: Emergency Medicine | Admitting: Emergency Medicine

## 2021-04-04 DIAGNOSIS — Z79899 Other long term (current) drug therapy: Secondary | ICD-10-CM | POA: Diagnosis not present

## 2021-04-04 DIAGNOSIS — Z7982 Long term (current) use of aspirin: Secondary | ICD-10-CM | POA: Diagnosis not present

## 2021-04-04 DIAGNOSIS — R110 Nausea: Secondary | ICD-10-CM | POA: Diagnosis not present

## 2021-04-04 DIAGNOSIS — Z8616 Personal history of COVID-19: Secondary | ICD-10-CM | POA: Insufficient documentation

## 2021-04-04 DIAGNOSIS — K5730 Diverticulosis of large intestine without perforation or abscess without bleeding: Secondary | ICD-10-CM | POA: Diagnosis not present

## 2021-04-04 DIAGNOSIS — K838 Other specified diseases of biliary tract: Secondary | ICD-10-CM | POA: Diagnosis not present

## 2021-04-04 DIAGNOSIS — R1084 Generalized abdominal pain: Secondary | ICD-10-CM | POA: Diagnosis not present

## 2021-04-04 DIAGNOSIS — I10 Essential (primary) hypertension: Secondary | ICD-10-CM | POA: Insufficient documentation

## 2021-04-04 DIAGNOSIS — K5900 Constipation, unspecified: Secondary | ICD-10-CM

## 2021-04-04 DIAGNOSIS — E119 Type 2 diabetes mellitus without complications: Secondary | ICD-10-CM | POA: Insufficient documentation

## 2021-04-04 DIAGNOSIS — R140 Abdominal distension (gaseous): Secondary | ICD-10-CM | POA: Diagnosis present

## 2021-04-04 DIAGNOSIS — R109 Unspecified abdominal pain: Secondary | ICD-10-CM

## 2021-04-04 DIAGNOSIS — I700 Atherosclerosis of aorta: Secondary | ICD-10-CM | POA: Diagnosis not present

## 2021-04-04 LAB — COMPREHENSIVE METABOLIC PANEL
ALT: 11 U/L (ref 0–44)
AST: 21 U/L (ref 15–41)
Albumin: 3.6 g/dL (ref 3.5–5.0)
Alkaline Phosphatase: 150 U/L — ABNORMAL HIGH (ref 38–126)
Anion gap: 9 (ref 5–15)
BUN: 27 mg/dL — ABNORMAL HIGH (ref 8–23)
CO2: 25 mmol/L (ref 22–32)
Calcium: 8.8 mg/dL — ABNORMAL LOW (ref 8.9–10.3)
Chloride: 104 mmol/L (ref 98–111)
Creatinine, Ser: 1.92 mg/dL — ABNORMAL HIGH (ref 0.44–1.00)
GFR, Estimated: 28 mL/min — ABNORMAL LOW (ref >60)
Glucose, Bld: 114 mg/dL — ABNORMAL HIGH (ref 70–99)
Potassium: 3.4 mmol/L — ABNORMAL LOW (ref 3.5–5.1)
Sodium: 138 mmol/L (ref 135–145)
Total Bilirubin: 1 mg/dL (ref 0.3–1.2)
Total Protein: 7.2 g/dL (ref 6.5–8.1)

## 2021-04-04 LAB — CBC
HCT: 36.4 % (ref 36.0–46.0)
Hemoglobin: 11.9 g/dL — ABNORMAL LOW (ref 12.0–15.0)
MCH: 30.4 pg (ref 26.0–34.0)
MCHC: 32.7 g/dL (ref 30.0–36.0)
MCV: 92.9 fL (ref 80.0–100.0)
Platelets: 171 10*3/uL (ref 150–400)
RBC: 3.92 MIL/uL (ref 3.87–5.11)
RDW: 12.8 % (ref 11.5–15.5)
WBC: 7.7 10*3/uL (ref 4.0–10.5)
nRBC: 0 % (ref 0.0–0.2)

## 2021-04-04 LAB — LIPASE, BLOOD: Lipase: 29 U/L (ref 11–51)

## 2021-04-04 MED ORDER — DOCUSATE SODIUM 100 MG PO CAPS: 100.0000 mg | ORAL_CAPSULE | Freq: Two times a day (BID) | ORAL | 0 refills | Status: AC

## 2021-04-04 MED ORDER — BARIUM SULFATE 2.1 % PO SUSP
450.0000 mL | ORAL | Status: AC
  Administered 2021-04-04: 450 mL via ORAL
  Filled 2021-04-04: qty 450

## 2021-04-04 MED ORDER — POLYETHYLENE GLYCOL 3350 17 GM/SCOOP PO POWD: 17.0000 g | Freq: Every day | ORAL | 0 refills | Status: DC | PRN

## 2021-04-04 MED ORDER — SODIUM CHLORIDE 0.9 % IV BOLUS
1000.0000 mL | Freq: Once | INTRAVENOUS | Status: AC
  Administered 2021-04-04: 1000 mL via INTRAVENOUS

## 2021-04-04 NOTE — ED Provider Notes (Signed)
[facility] Emergency Department Provider Note  Time seen: 3:53 PM  I have reviewed the triage vital signs and the nursing notes.   HISTORY  Chief Complaint Abdominal Pain   HPI Julie Acevedo is a 70 y.o. female with a past medical history of anemia, arthritis, diabetes, hypertension, hyperlipidemia, bowel obstruction, presents to the emergency department for abdominal distention discomfort and constipation.  According to the patient over the past 2 weeks she has been constipated and has noticed her abdomen has become somewhat distended compared to her baseline.  Patient states a history of 3 prior bowel obstructions including 1 of which that required a surgery.  Patient denies any nausea or vomiting.  Has tried taking laxatives at home without relief.  Has been drinking lots of fluids per patient but no relief.   Past Medical History:  Diagnosis Date  . Anemia   . Arthritis   . COVID-19 11/19/2019  . Diverticulitis   . DM (diabetes mellitus) ([place])    typ e 2  . History of methicillin resistant staphylococcus aureus (MRSA) 2017  . HTN (hypertension)   . Hyperlipidemia   . Memory difficulties 09/01/2015  . Multiple thyroid nodules   . Myocardial infarction ([place]) 1995  . Short-term memory loss     Patient Active Problem List   Diagnosis Date Noted  . Callus of foot 01/19/2021  . Pes planus 01/19/2021  . PTTD (posterior tibial tendon dysfunction) 01/19/2021  . Acute flank pain 12/15/2020  . Cystitis 12/15/2020  . Urinary symptom or sign 12/15/2020  . Low back pain radiating to right lower extremity 06/23/2020  . Gastroesophageal reflux disease 04/28/2020  . Large bowel obstruction ([place]) 04/17/2019  . Acquired trigger finger 03/20/2019  . Iron deficiency anemia 11/21/2018  . Syncope 03/18/2018  . Anemia 03/18/2018  . Exophthalmos 02/22/2018  . Multiple thyroid nodules 02/22/2018  . 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 Pain at surgical site  08/30/2016  . Ventral incisional hernia 08/21/2016  . SBO (small bowel obstruction) ([place])   . Partial small bowel obstruction ([place]) 04/23/2016  . Adjustment disorder with mixed anxiety and depressed mood 04/23/2016  . Memory difficulties 09/01/2015  . Arthritis 06/11/2015  . Back pain, chronic 06/11/2015  . HLD (hyperlipidemia) 06/11/2015  . Cannot sleep 06/11/2015  . L-S radiculopathy 06/11/2015  . Arthritis of knee, degenerative 06/11/2015  . B12 deficiency 06/11/2015  . 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 DM2 (diabetes mellitus, type 2) ([place]) 11/15/2012  . Adiposity 11/21/2009  . Avitaminosis D 11/07/2009  . Narrowing of intervertebral disc space 07/25/2009  . Benign essential HTN 07/25/2009    Past Surgical History:  Procedure Laterality Date  . ABDOMINAL HYSTERECTOMY     due to endometriosis-1 ovary left  . BREAST EXCISIONAL BIOPSY Right 1971   neg  . CARDIAC CATHETERIZATION  2000   no significant CAD per note of Dr Caryl Comes in 2013   . CHOLECYSTECTOMY    . CYSTOCELE REPAIR N/A 03/19/2017   Procedure: ANTERIOR REPAIR (CYSTOCELE);  Surgeon: Bjorn Loser, MD;  Location: [facility];  Service: Urology;  Laterality: N/A;  . CYSTOSCOPY N/A 03/19/2017   Procedure: CYSTOSCOPY;  Surgeon: Bjorn Loser, MD;  Location: [facility];  Service: Urology;  Laterality: N/A;  . EYE SURGERY    . FOOT SURGERY    . HAND SURGERY    . HEMICOLECTOMY    . INSERTION OF MESH N/A 08/21/2016   Procedure: INSERTION  OF MESH;  Surgeon: Excell Seltzer, MD;  Location: [facility];  Service: General;  Laterality: N/A;  . INSERTION OF MESH N/A 12/08/2019   Procedure: INSERTION OF MESH;  Surgeon: Jules Husbands, MD;  Location: [facility];  Service: General;  Laterality: N/A;  . LAPAROSCOPIC LYSIS OF ADHESIONS N/A 08/21/2016   Procedure: LAPAROSCOPIC LYSIS OF ADHESIONS;  Surgeon: Excell Seltzer, MD;  Location: [facility];  Service: General;  Laterality:  N/A;  . LAPAROSCOPIC REMOVAL ABDOMINAL MASS    . LAPAROTOMY N/A 04/16/2019   Procedure: EXPLORATORY LAPAROTOMY;  Surgeon: Benjamine Sprague, DO;  Location: [facility];  Service: General;  Laterality: N/A;  . LOOP RECORDER INSERTION N/A 03/21/2018   Procedure: LOOP RECORDER INSERTION;  Surgeon: Sanda Klein, MD;  Location: [facility];  Service: Cardiovascular;  Laterality: N/A;  . LUMBAR [place]    . ROUX-EN-Y GASTRIC BYPASS  2010   Dr Hassell Done  . TONSILLECTOMY    . UPPER GI ENDOSCOPY  01/12/16   normal larynx, normal esophagus. gastric bypass with a normal-sized pouch and intact staple line, normal examined jejunum, otherwise exam was normal  . VENTRAL HERNIA REPAIR N/A 08/21/2016   Procedure: LAPAROSCOPIC VENTRAL HERNIA;  Surgeon: Excell Seltzer, MD;  Location: [facility];  Service: General;  Laterality: N/A;  . XI ROBOTIC ASSISTED VENTRAL HERNIA N/A 12/08/2019   Procedure: XI ROBOTIC ASSISTED VENTRAL HERNIA;  Surgeon: Jules Husbands, MD;  Location: [facility];  Service: General;  Laterality: N/A;    Prior to Admission medications   Medication Sig Start Date End Date Taking? Authorizing Provider  cyclobenzaprine (FLEXERIL) 5 MG tablet TAKE 1 TABLET BY MOUTH 3 TIMES DAILY AS NEEDED. 12/19/20   Jerrol Banana., MD  gabapentin (NEURONTIN) 100 MG capsule TAKE 2 CAPSULES (200 MG TOTAL) BY MOUTH 3 (THREE) TIMES DAILY. Patient taking differently: Take 200 mg by mouth 2 (two) times daily. 01/24/21   Jerrol Banana., MD  hydrochlorothiazide (HYDRODIURIL) 25 MG tablet Take 25 mg by mouth daily.    [provider]  LINZESS 290 MCG CAPS capsule TAKE 1 CAPSULE (290 MCG TOTAL) BY MOUTH DAILY BEFORE BREAKFAST. 04/04/21   Jerrol Banana., MD  metFORMIN (GLUCOPHAGE) 500 MG tablet TAKE 1 TABLET (500 MG TOTAL) BY MOUTH 2 (TWO) TIMES DAILY WITH A MEAL. Patient taking differently: Take 500 mg by mouth daily with breakfast. 01/10/21   Jerrol Banana., MD  methocarbamol (ROBAXIN)  500 MG tablet Take 500 mg by mouth 2 (two) times daily. Back pain 05/22/19   [provider]  metoprolol succinate (TOPROL-XL) 25 MG 24 hr tablet Take 25 mg by mouth daily. 04/28/20   [provider]  omeprazole (PRILOSEC) 20 MG capsule TAKE 1 CAPSULE BY MOUTH EVERY DAY 11/18/20   Jerrol Banana., MD  ondansetron (ZOFRAN) 4 MG tablet Take 1 tablet (4 mg total) by mouth every 8 (eight) hours as needed. 03/20/21   Pabon, Diego F, MD  Prasterone (INTRAROSA) 6.5 MG INST Place 6.5 mg vaginally at bedtime. 02/08/21   Gae Dry, MD  traMADol (ULTRAM) 50 MG tablet Take 1 tablet (50 mg total) by mouth every 6 (six) hours as needed. 01/13/21   Jerrol Banana., MD  traZODone (DESYREL) 50 MG tablet TAKE 1 TABLET (50 MG TOTAL) BY MOUTH AT BEDTIME AS NEEDED. 08/17/20   Jerrol Banana., MD  Vaginal Lubricant (REPLENS) GEL Place 1 Applicatorful vaginally 3 (three) times a week. Patient not taking:  No sig reported 11/30/20   Gae Dry, MD  diphenhydrAMINE (BENADRYL) 50 MG tablet Patient is to take one tablet 1 hour before test. Patient not taking: Reported on 04/28/2020 04/04/20 05/15/20  Jules Husbands, MD    Allergies  Allergen Reactions  . Lotrel [Amlodipine Besy-Benazepril Hcl] Anaphylaxis  . Contrast Media [Iodinated Diagnostic Agents] Swelling and Other (See Comments)    Reaction:  Neck swelling   . Niacin Hives  . Sulfa Antibiotics Hives    Family History  Problem Relation Age of Onset  . Cancer Father 24       Lung  . Coronary artery disease Father 84  . COPD Mother   . Diabetes Mother   . Hypertension Mother   . Cancer Paternal Grandmother        Breast  . Breast cancer Paternal Grandmother   . Congestive Heart Failure Maternal Grandmother   . Emphysema Maternal Grandfather   . Heart attack Paternal Grandfather   . Cancer Paternal Aunt        Breast  . Breast cancer Paternal Aunt 53  . Breast cancer Paternal Aunt 44    Social History Social  History   Tobacco Use  . Smoking status: Never Smoker  . Smokeless tobacco: Never Used  Vaping Use  . Vaping Use: Never used  Substance Use Topics  . Alcohol use: No  . Drug use: No    Review of Systems Constitutional: Negative for fever. Cardiovascular: Negative for chest pain. Respiratory: Negative for shortness of breath. Gastrointestinal: Mild diffuse abdominal pain.  Negative for nausea vomiting.  Positive for constipation. Genitourinary: Negative for urinary compaints Musculoskeletal: Negative for musculoskeletal complaints Neurological: Negative for headache All other ROS negative  ____________________________________________   PHYSICAL EXAM:  VITAL SIGNS: ED Triage Vitals [04/04/21 1546]  Enc Vitals Group     BP (!) 123/59     Pulse Rate 78     Resp 16     Temp 98 F (36.7 C)     Temp src      SpO2 98 %     Weight 130 lb (59 kg)     Height 5\' 3"  (1.6 m)     Head Circumference      Peak Flow      Pain Score      Pain Loc      Pain Edu?      Excl. in [place]?    Constitutional: Alert and oriented. Well appearing and in no distress. Eyes: Normal exam ENT      Head: Normocephalic and atraumatic.      Mouth/Throat: Mucous membranes are moist. Cardiovascular: Normal rate, regular rhythm. No murmur Respiratory: Normal respiratory effort without tachypnea nor retractions. Breath sounds are clear Gastrointestinal: Soft, mild to moderate diffuse tenderness somewhat more so on the left upper and mid abdomen.  No rebound or guarding.  No obvious distention. Musculoskeletal: Nontender with normal range of motion in all extremities. Neurologic:  Normal speech and language. No gross focal neurologic deficits Skin:  Skin is warm, dry and intact.  Psychiatric: Mood and affect are normal.  ____________________________________________    RADIOLOGY  CT scan is negative for acute abnormality.  ____________________________________________   INITIAL IMPRESSION /  ASSESSMENT AND PLAN / ED COURSE  Pertinent labs & imaging results that were available during my care of the patient were reviewed by me and considered in my medical decision making (see chart for details).   Patient presents to the emergency department for  abdominal pain and discomfort as well as 2 weeks of constipation.  Overall the patient appears well she does have moderate fairly diffuse abdominal discomfort more so on the left side.  No rebound or guarding.  We will check labs, given the patient's history of 3 prior bowel obstructions we will obtain CT imaging the abdomen pelvis without contrast given IV contrast allergy but with oral contrast.  CT scan is negative for acute abnormality.  Discussed with the patient using MiraLAX and Colace both twice daily until she has multiple bowel movements.  Patient agreeable plan of care.  Patient has not yet provided a urine sample.  Patient states she does not want to wait here any longer and has no urinary symptoms and is following up with her doctor on Thursday regardless.  We will discharge the patient home with bowel recommendations.  Discussed return precautions.  AKI BURDIN was evaluated in Emergency Department on 04/04/2021 for the symptoms described in the history of present illness. She was evaluated in the context of the global COVID-19 pandemic, which necessitated consideration that the patient might be at risk for infection with the SARS-CoV-2 virus that causes COVID-19. Institutional protocols and algorithms that pertain to the evaluation of patients at risk for COVID-19 are in a state of rapid change based on information released by regulatory bodies including the CDC and federal and state organizations. These policies and algorithms were followed during the patient's care in the ED.  ____________________________________________   FINAL CLINICAL IMPRESSION(S) / ED DIAGNOSES  Abdominal pain   Harvest Dark, MD 04/04/21 [phone]

## 2021-04-04 NOTE — ED Notes (Signed)
Husband called.  Gave phone to patient so she can talk to him.  Her IVF is not running fast.  Put towel undrer elbow and it runs well.

## 2021-04-04 NOTE — ED Triage Notes (Signed)
Patient reports abdominal pain for 1 day. Patient reports nausea but no vomiting. Hx of SBO. Patient A&OX3.

## 2021-04-06 ENCOUNTER — Other Ambulatory Visit: Payer: Self-pay

## 2021-04-06 DIAGNOSIS — E785 Hyperlipidemia, unspecified: Secondary | ICD-10-CM | POA: Diagnosis not present

## 2021-04-06 DIAGNOSIS — E1122 Type 2 diabetes mellitus with diabetic chronic kidney disease: Secondary | ICD-10-CM | POA: Diagnosis not present

## 2021-04-06 DIAGNOSIS — I10 Essential (primary) hypertension: Secondary | ICD-10-CM | POA: Diagnosis not present

## 2021-04-06 DIAGNOSIS — D631 Anemia in chronic kidney disease: Secondary | ICD-10-CM | POA: Diagnosis not present

## 2021-04-06 DIAGNOSIS — R8290 Unspecified abnormal findings in urine: Secondary | ICD-10-CM | POA: Diagnosis not present

## 2021-04-06 DIAGNOSIS — R809 Proteinuria, unspecified: Secondary | ICD-10-CM | POA: Diagnosis not present

## 2021-04-06 MED ORDER — METOPROLOL SUCCINATE ER 25 MG PO TB24: 25.0000 mg | ORAL_TABLET | Freq: Every day | ORAL | 1 refills | Status: DC

## 2021-04-06 MED ORDER — HYDROCHLOROTHIAZIDE 25 MG PO TABS: 25.0000 mg | ORAL_TABLET | Freq: Every day | ORAL | 1 refills | Status: DC

## 2021-04-06 NOTE — Telephone Encounter (Signed)
Patient is requesting Dr. Rosanna Randy fill her blood pressure medications. Is this okay to fill?

## 2021-04-06 NOTE — Addendum Note (Signed)
Addended by: Julieta Bellini on: 04/06/2021 04:10 PM   Modules accepted: Orders

## 2021-04-06 NOTE — Telephone Encounter (Signed)
Copied from [place] [id number]. Topic: General - Other >> Apr 05, 2021  9:27 AM Leward Quan A wrote: Reason for CRM: Patient asking for a call back from Dr Marlan Palau nurse say that she need to discuss a medication that she need refilled. Please call Ph# [phone]

## 2021-04-10 IMAGING — DX DG ABDOMEN ACUTE W/ 1V CHEST
3 series · 3 of 3 positions shown · non-contrast
Comparison: CT abdomen and pelvis 04/23/2016. Acute abdomen series
04/24/2016. Chest x-rays 10/13/2018 and earlier.

CLINICAL DATA: One-week history of nausea and 2 day history of
vomiting.

EXAM:
DG ABDOMEN ACUTE W/ 1V CHEST

[abdomen supine]
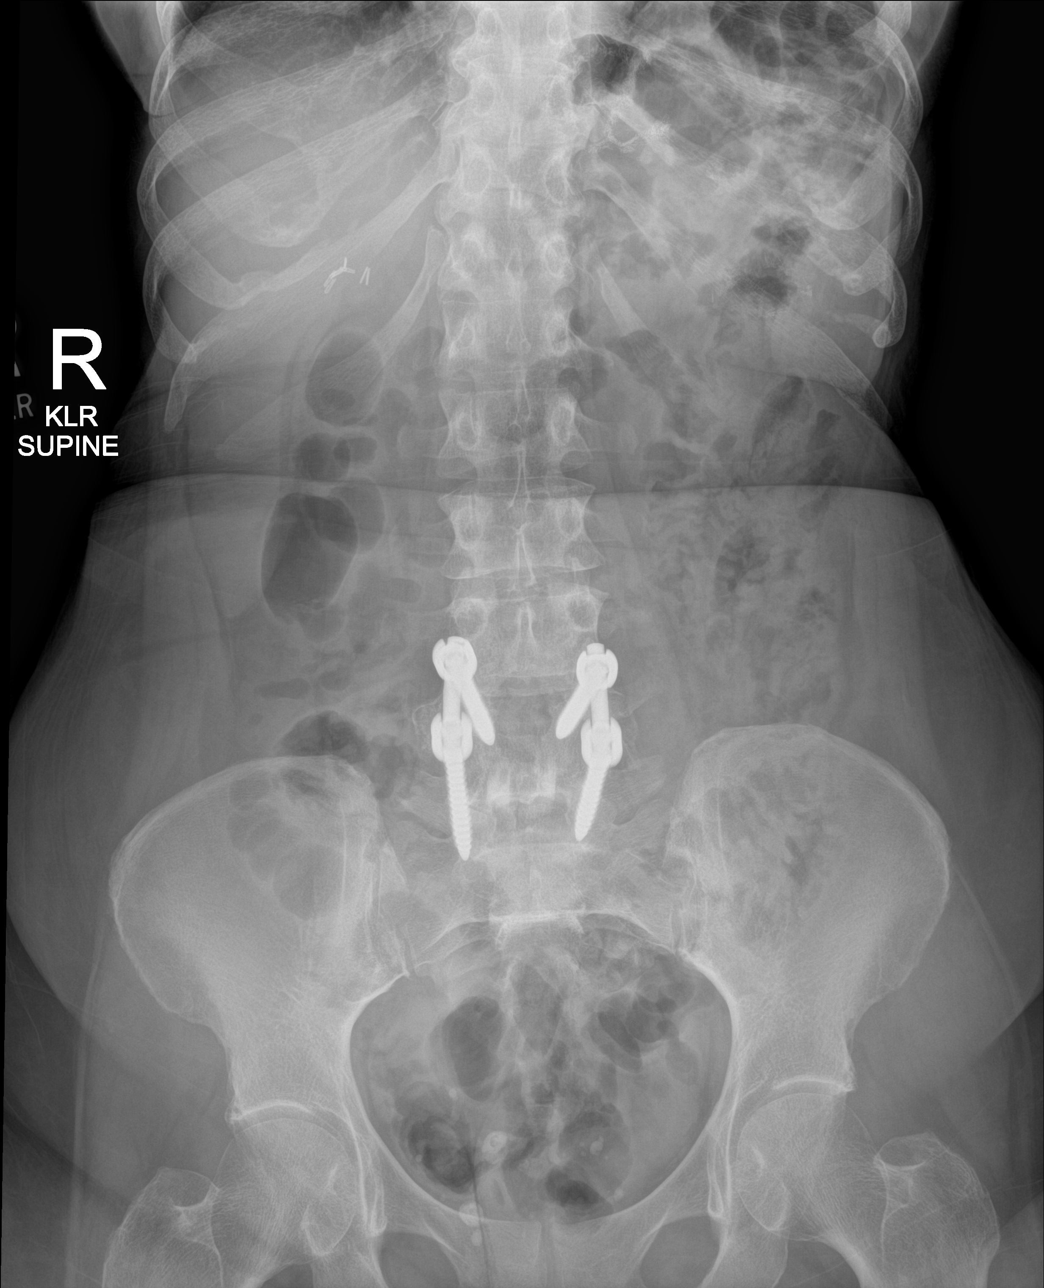

[abdomen erect]
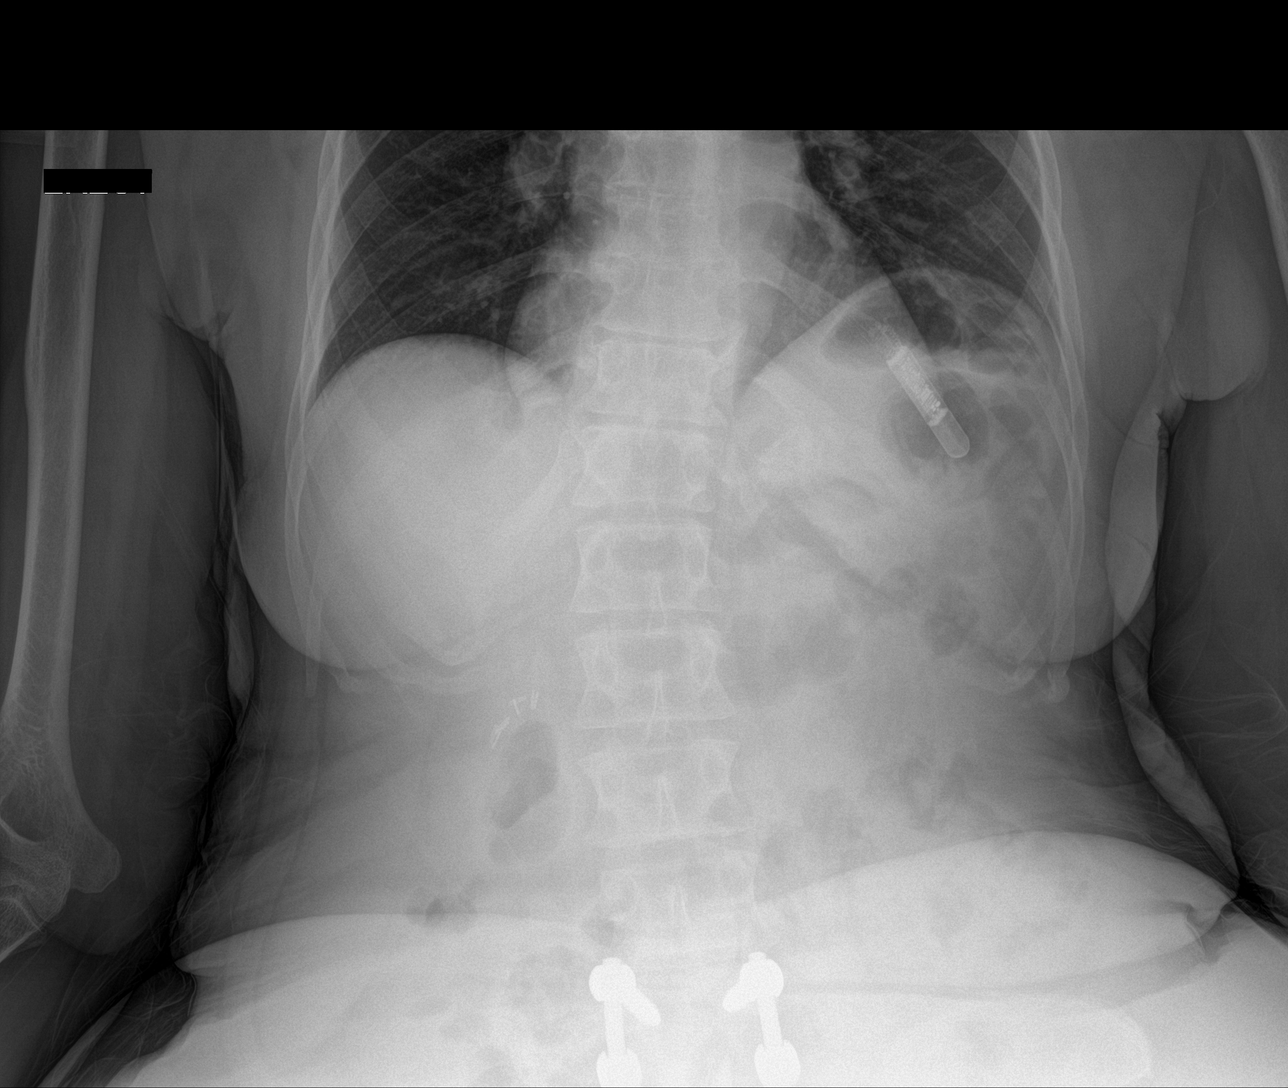

[chest ap]
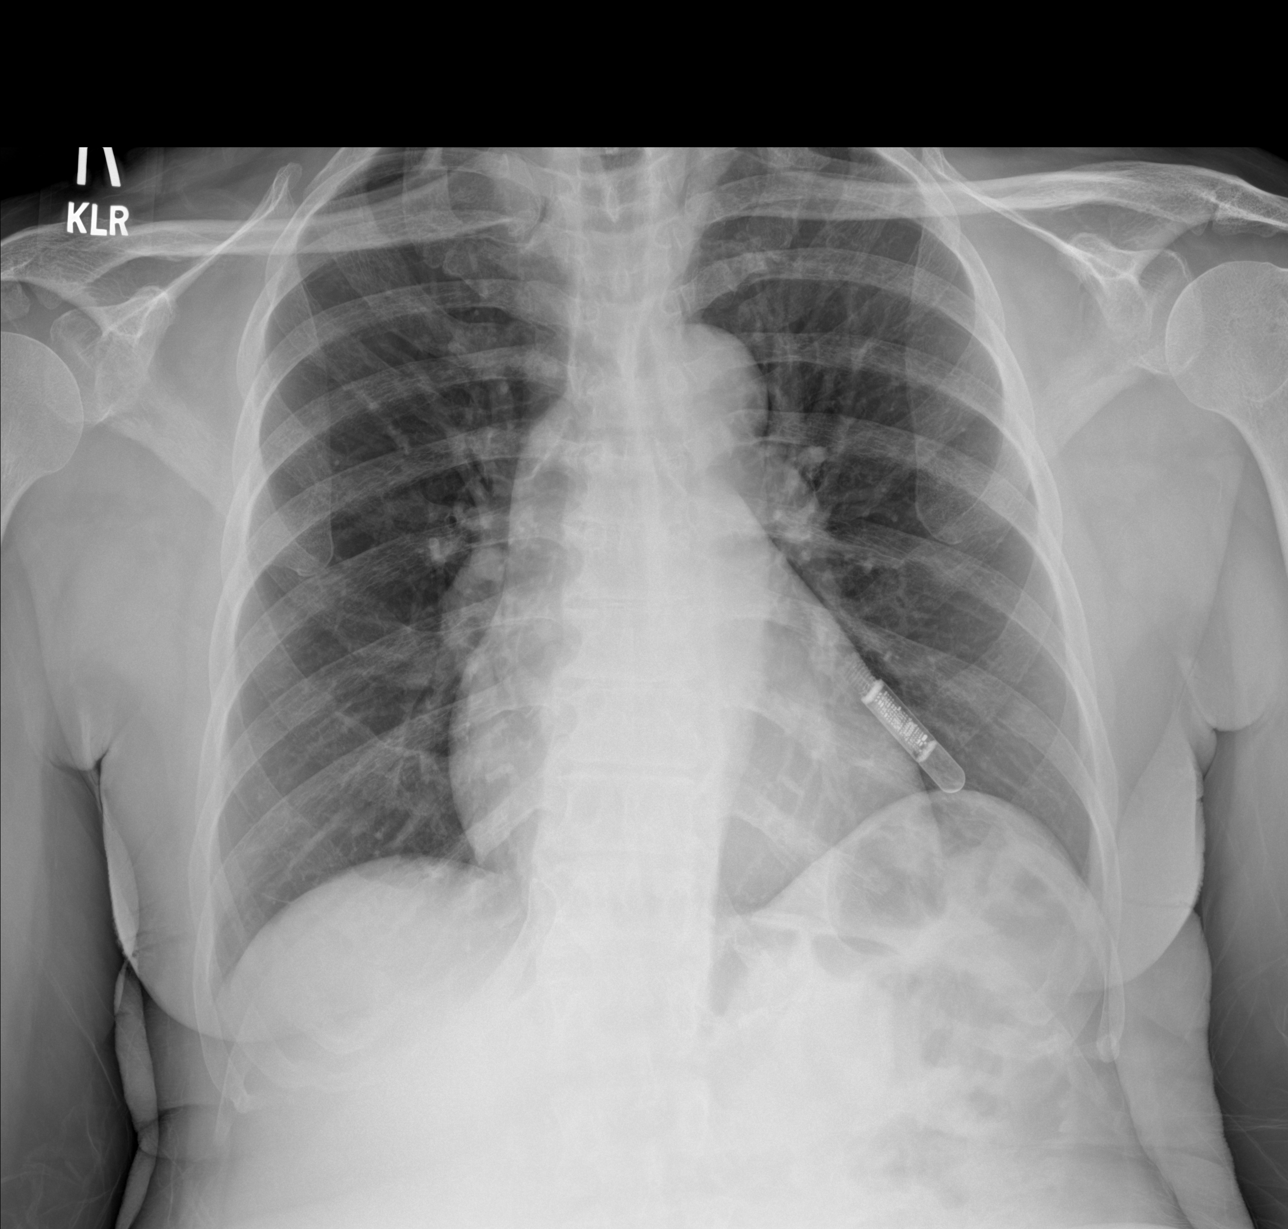

[3 of 3 positions shown; findings below may reference images not displayed]

FINDINGS: Bowel gas pattern nonobstructive. The nondistended gas-filled loops
of jejunum in the LEFT UPPER QUADRANT may have thickened folds.
Remaining gas-filled small bowel unremarkable. Expected stool burden
throughout normal appearing colon. No evidence of free air or
significant air-fluid levels on the ERECT image. Surgically absent
gallbladder. Numerous pelvic phleboliths. No visible opaque urinary
tract calculi. Prior LOWER lumbar fusion.

Cardiac silhouette normal in size for AP technique. Lungs clear.
Bronchovascular markings normal. Pulmonary vascularity normal. No
visible pleural effusions. No pneumothorax. Cardiac recording device
projects over the LEFT LOWER chest.
IMPRESSION: 1. No evidence of bowel obstruction or free air. Enteritis involving
the jejunum is suspected, as there is apparent fold thickening
involving the gas-filled jejunal loops.
2.  No acute cardiopulmonary disease.

## 2021-04-11 ENCOUNTER — Encounter: Payer: Self-pay | Admitting: Obstetrics & Gynecology

## 2021-04-11 ENCOUNTER — Other Ambulatory Visit: Payer: Self-pay

## 2021-04-11 ENCOUNTER — Ambulatory Visit (INDEPENDENT_AMBULATORY_CARE_PROVIDER_SITE_OTHER): Payer: Medicare HMO | Admitting: Obstetrics & Gynecology

## 2021-04-11 VITALS — BP 100/60 | Ht 63.0 in | Wt 130.0 lb

## 2021-04-11 DIAGNOSIS — N9419 Other specified dyspareunia: Secondary | ICD-10-CM

## 2021-04-11 NOTE — Progress Notes (Signed)
History of Present Illness:  Julie Acevedo is a 70 y.o. who was started on Intrarosa approximately 2 months ago. Since that time, she states that her symptoms of dyspareunia are twofold: moisturization and entry pains have improved, deep dyspareunia has not, and in fact limits her ability to be sexually active.  She has had multiple abdominal surgeries in past, incl hysterectomy and surgery for bowel obstructions, and so the concern for adhesions and pain related to that are present.  No prolapse of uterus as she has had hysterectomy.  Pt denies bleeding.  Pt has libido.  PMHx: She  has a past medical history of Anemia, Arthritis, COVID-19 (11/19/2019), Diverticulitis, DM (diabetes mellitus) ([place]), History of methicillin resistant staphylococcus aureus (MRSA) (2017), HTN (hypertension), Hyperlipidemia, Memory difficulties (09/01/2015), Multiple thyroid nodules, Myocardial infarction ([place]) (1995), and Short-term memory loss. Also,  has a past surgical history that includes Hemicolectomy; Lumbar disc surgery; Roux-en-Y Gastric Bypass (2010); Cholecystectomy; Eye surgery; Hand surgery; Foot surgery; Abdominal hysterectomy; Tonsillectomy; Upper gi endoscopy (01/12/16); Cardiac catheterization (2000); Laparoscopic lysis of adhesions (N/A, 08/21/2016); Ventral hernia repair (N/A, 08/21/2016); Insertion of mesh (N/A, 08/21/2016); Cystocele repair (N/A, 03/19/2017); Cystoscopy (N/A, 03/19/2017); Breast excisional biopsy (Right, 1971); Laparoscopic removal abdominal mass; LOOP RECORDER INSERTION (N/A, 03/21/2018); laparotomy (N/A, 04/16/2019); XI robotic assisted ventral hernia (N/A, 12/08/2019); and Insertion of mesh (N/A, 12/08/2019)., family history includes Breast cancer in her paternal grandmother; Breast cancer (age of onset: 52) in her paternal aunt; Breast cancer (age of onset: 47) in her paternal aunt; COPD in her mother; Cancer in her paternal aunt and paternal grandmother; Cancer (age of onset: 74) in her father; Congestive  Heart Failure in her maternal grandmother; Coronary artery disease (age of onset: 59) in her father; Diabetes in her mother; Emphysema in her maternal grandfather; Heart attack in her paternal grandfather; Hypertension in her mother.,  reports that she has never smoked. She has never used smokeless tobacco. She reports that she does not drink alcohol and does not use drugs. Current Meds  Medication Sig  . cyclobenzaprine (FLEXERIL) 5 MG tablet TAKE 1 TABLET BY MOUTH 3 TIMES DAILY AS NEEDED.  Marland Kitchen docusate sodium (COLACE) 100 MG capsule Take 1 capsule (100 mg total) by mouth 2 (two) times daily for 10 days.  Marland Kitchen gabapentin (NEURONTIN) 100 MG capsule TAKE 2 CAPSULES (200 MG TOTAL) BY MOUTH 3 (THREE) TIMES DAILY. (Patient taking differently: Take 200 mg by mouth 2 (two) times daily.)  . hydrochlorothiazide (HYDRODIURIL) 25 MG tablet Take 1 tablet (25 mg total) by mouth daily.  Marland Kitchen LINZESS 290 MCG CAPS capsule TAKE 1 CAPSULE (290 MCG TOTAL) BY MOUTH DAILY BEFORE BREAKFAST.  . metFORMIN (GLUCOPHAGE) 500 MG tablet TAKE 1 TABLET (500 MG TOTAL) BY MOUTH 2 (TWO) TIMES DAILY WITH A MEAL. (Patient taking differently: Take 500 mg by mouth daily with breakfast.)  . methocarbamol (ROBAXIN) 500 MG tablet Take 500 mg by mouth 2 (two) times daily. Back pain  . metoprolol succinate (TOPROL-XL) 25 MG 24 hr tablet Take 1 tablet (25 mg total) by mouth daily.  Marland Kitchen omeprazole (PRILOSEC) 20 MG capsule TAKE 1 CAPSULE BY MOUTH EVERY DAY  . ondansetron (ZOFRAN) 4 MG tablet Take 1 tablet (4 mg total) by mouth every 8 (eight) hours as needed.  . polyethylene glycol powder (GLYCOLAX/MIRALAX) 17 GM/SCOOP powder Take 17 g by mouth daily as needed for moderate constipation.  . Prasterone (INTRAROSA) 6.5 MG INST Place 6.5 mg vaginally at bedtime.  . traMADol (ULTRAM) 50 MG tablet Take 1  tablet (50 mg total) by mouth every 6 (six) hours as needed.  . traZODone (DESYREL) 50 MG tablet TAKE 1 TABLET (50 MG TOTAL) BY MOUTH AT BEDTIME AS NEEDED.   . Vaginal Lubricant (REPLENS) GEL Place 1 Applicatorful vaginally 3 (three) times a week.  . Also, is allergic to lotrel [amlodipine besy-benazepril hcl], contrast media [iodinated diagnostic agents], niacin, and sulfa antibiotics..  Review of Systems  All other systems reviewed and are negative.   Physical Exam:  BP 100/60   Ht 5\' 3"  (1.6 m)   Wt 130 lb (59 kg)   BMI 23.03 kg/m  Body mass index is 23.03 kg/m. Constitutional: Well nourished, well developed female in no acute distress.  Abdomen: diffusely non tender to palpation, non distended, and no masses, hernias Neuro: Grossly intact Psych:  Normal mood and affect.    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  A total of 21 minutes were spent face-to-face with the patient as well as preparation, review, communication, and documentation during this encounter as it relates to dyspareunia, adhesions, medicine and alternative therapies.   Barnett Applebaum, MD, [facility], [facility] 04/11/2021  9:17 AM

## 2021-04-14 DIAGNOSIS — R569 Unspecified convulsions: Secondary | ICD-10-CM | POA: Diagnosis not present

## 2021-04-17 ENCOUNTER — Ambulatory Visit (INDEPENDENT_AMBULATORY_CARE_PROVIDER_SITE_OTHER): Payer: Medicare HMO

## 2021-04-17 DIAGNOSIS — R55 Syncope and collapse: Secondary | ICD-10-CM

## 2021-04-18 ENCOUNTER — Other Ambulatory Visit: Payer: Self-pay

## 2021-04-18 ENCOUNTER — Emergency Department
Admission: EM | Admit: 2021-04-18 | Discharge: 2021-04-18 | Disposition: A | Discharge status: Home / Self Care | Payer: Medicare HMO | Attending: Emergency Medicine | Admitting: Emergency Medicine

## 2021-04-18 DIAGNOSIS — Z79899 Other long term (current) drug therapy: Secondary | ICD-10-CM | POA: Diagnosis not present

## 2021-04-18 DIAGNOSIS — Z7984 Long term (current) use of oral hypoglycemic drugs: Secondary | ICD-10-CM | POA: Insufficient documentation

## 2021-04-18 DIAGNOSIS — K122 Cellulitis and abscess of mouth: Secondary | ICD-10-CM | POA: Diagnosis not present

## 2021-04-18 DIAGNOSIS — E119 Type 2 diabetes mellitus without complications: Secondary | ICD-10-CM | POA: Insufficient documentation

## 2021-04-18 DIAGNOSIS — I10 Essential (primary) hypertension: Secondary | ICD-10-CM | POA: Insufficient documentation

## 2021-04-18 DIAGNOSIS — H9203 Otalgia, bilateral: Secondary | ICD-10-CM | POA: Insufficient documentation

## 2021-04-18 DIAGNOSIS — J029 Acute pharyngitis, unspecified: Secondary | ICD-10-CM | POA: Insufficient documentation

## 2021-04-18 DIAGNOSIS — Z8616 Personal history of COVID-19: Secondary | ICD-10-CM | POA: Insufficient documentation

## 2021-04-18 DIAGNOSIS — R1310 Dysphagia, unspecified: Secondary | ICD-10-CM | POA: Diagnosis present

## 2021-04-18 DIAGNOSIS — E876 Hypokalemia: Secondary | ICD-10-CM | POA: Diagnosis not present

## 2021-04-18 LAB — BASIC METABOLIC PANEL
Anion gap: 8 (ref 5–15)
BUN: 19 mg/dL (ref 8–23)
CO2: 29 mmol/L (ref 22–32)
Calcium: 8.9 mg/dL (ref 8.9–10.3)
Chloride: 103 mmol/L (ref 98–111)
Creatinine, Ser: 1.55 mg/dL — ABNORMAL HIGH (ref 0.44–1.00)
GFR, Estimated: 36 mL/min — ABNORMAL LOW (ref >60)
Glucose, Bld: 86 mg/dL (ref 70–99)
Potassium: 2.9 mmol/L — ABNORMAL LOW (ref 3.5–5.1)
Sodium: 140 mmol/L (ref 135–145)

## 2021-04-18 LAB — CBC WITH DIFFERENTIAL/PLATELET
Abs Immature Granulocytes: 0.02 10*3/uL (ref 0.00–0.07)
Basophils Absolute: 0 10*3/uL (ref 0.0–0.1)
Basophils Relative: 0 %
Eosinophils Absolute: 0.1 10*3/uL (ref 0.0–0.5)
Eosinophils Relative: 1 %
HCT: 35.3 % — ABNORMAL LOW (ref 36.0–46.0)
Hemoglobin: 11.3 g/dL — ABNORMAL LOW (ref 12.0–15.0)
Immature Granulocytes: 0 %
Lymphocytes Relative: 19 %
Lymphs Abs: 1.5 10*3/uL (ref 0.7–4.0)
MCH: 29.3 pg (ref 26.0–34.0)
MCHC: 32 g/dL (ref 30.0–36.0)
MCV: 91.5 fL (ref 80.0–100.0)
Monocytes Absolute: 0.5 10*3/uL (ref 0.1–1.0)
Monocytes Relative: 7 %
Neutro Abs: 5.7 10*3/uL (ref 1.7–7.7)
Neutrophils Relative %: 73 %
Platelets: 347 10*3/uL (ref 150–400)
RBC: 3.86 MIL/uL — ABNORMAL LOW (ref 3.87–5.11)
RDW: 12.7 % (ref 11.5–15.5)
WBC: 7.9 10*3/uL (ref 4.0–10.5)
nRBC: 0 % (ref 0.0–0.2)

## 2021-04-18 LAB — GROUP A STREP BY PCR: Group A Strep by PCR: NOT DETECTED

## 2021-04-18 MED ORDER — DEXAMETHASONE SODIUM PHOSPHATE 10 MG/ML IJ SOLN
10.0000 mg | Freq: Once | INTRAMUSCULAR | Status: AC
  Administered 2021-04-18: 10 mg via INTRAVENOUS
  Filled 2021-04-18: qty 1

## 2021-04-18 MED ORDER — SODIUM CHLORIDE 0.9 % IV SOLN
1.0000 g | Freq: Once | INTRAVENOUS | Status: AC
  Administered 2021-04-18: 1 g via INTRAVENOUS
  Filled 2021-04-18: qty 10

## 2021-04-18 MED ORDER — PREDNISONE 10 MG PO TABS: ORAL_TABLET | ORAL | 0 refills | Status: DC

## 2021-04-18 MED ORDER — LIDOCAINE VISCOUS HCL 2 % MT SOLN
15.0000 mL | Freq: Once | OROMUCOSAL | Status: AC
  Administered 2021-04-18: 15 mL via OROMUCOSAL

## 2021-04-18 MED ORDER — CEPHALEXIN 250 MG/5ML PO SUSR
500.0000 mg | Freq: Three times a day (TID) | ORAL | 0 refills | Status: DC
Start: 2021-04-18 — End: 2021-06-02

## 2021-04-18 MED ORDER — POTASSIUM CHLORIDE CRYS ER 10 MEQ PO TBCR: 10.0000 meq | EXTENDED_RELEASE_TABLET | Freq: Every day | ORAL | 0 refills | Status: DC

## 2021-04-18 MED ORDER — POTASSIUM CHLORIDE CRYS ER 20 MEQ PO TBCR
20.0000 meq | EXTENDED_RELEASE_TABLET | Freq: Once | ORAL | Status: AC
  Administered 2021-04-18: 20 meq via ORAL
  Filled 2021-04-18: qty 1

## 2021-04-18 MED ORDER — SODIUM CHLORIDE 0.9 % IV BOLUS
1000.0000 mL | Freq: Once | INTRAVENOUS | Status: AC
  Administered 2021-04-18: 1000 mL via INTRAVENOUS

## 2021-04-18 NOTE — ED Provider Notes (Signed)
[facility] Emergency Department Provider Note   ____________________________________________   Event Date/Time   First MD Initiated Contact with Patient 04/18/21 [phone]     (approximate)  I have reviewed the triage vital signs and the nursing notes.   HISTORY  Chief Complaint Sore Throat   HPI Julie Acevedo is a 70 y.o. female presents to the ED with history sore throat x3 days.  Patient stated she had 1 day of fever and also bilateral ear pain.  Patient reports that her pain is worse with swallowing.  She denies any URI symptoms.  She rates her pain as a 10/10.      Past Medical History:  Diagnosis Date  . Anemia   . Arthritis   . COVID-19 11/19/2019  . Diverticulitis   . DM (diabetes mellitus) ([place])    typ e 2  . History of methicillin resistant staphylococcus aureus (MRSA) 2017  . HTN (hypertension)   . Hyperlipidemia   . Memory difficulties 09/01/2015  . Multiple thyroid nodules   . Myocardial infarction ([place]) 1995  . Short-term memory loss     Patient Active Problem List   Diagnosis Date Noted  . Callus of foot 01/19/2021  . Pes planus 01/19/2021  . PTTD (posterior tibial tendon dysfunction) 01/19/2021  . Acute flank pain 12/15/2020  . Cystitis 12/15/2020  . Urinary symptom or sign 12/15/2020  . Low back pain radiating to right lower extremity 06/23/2020  . Gastroesophageal reflux disease 04/28/2020  . Large bowel obstruction ([place]) 04/17/2019  . Acquired trigger finger 03/20/2019  . Iron deficiency anemia 11/21/2018  . Syncope 03/18/2018  . Anemia 03/18/2018  . Exophthalmos 02/22/2018  . Multiple thyroid nodules 02/22/2018  . 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 Pain at surgical site 08/30/2016  . Ventral incisional hernia 08/21/2016  . SBO (small bowel obstruction) ([place])   . Partial small bowel obstruction ([place]) 04/23/2016  . Adjustment disorder with mixed anxiety and depressed mood 04/23/2016  . Memory difficulties  09/01/2015  . Arthritis 06/11/2015  . Back pain, chronic 06/11/2015  . HLD (hyperlipidemia) 06/11/2015  . Cannot sleep 06/11/2015  . L-S radiculopathy 06/11/2015  . Arthritis of knee, degenerative 06/11/2015  . B12 deficiency 06/11/2015  . 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 DM2 (diabetes mellitus, type 2) ([place]) 11/15/2012  . Adiposity 11/21/2009  . Avitaminosis D 11/07/2009  . Narrowing of intervertebral disc space 07/25/2009  . Benign essential HTN 07/25/2009    Past Surgical History:  Procedure Laterality Date  . ABDOMINAL HYSTERECTOMY     due to endometriosis-1 ovary left  . BREAST EXCISIONAL BIOPSY Right 1971   neg  . CARDIAC CATHETERIZATION  2000   no significant CAD per note of Dr Caryl Comes in 2013   . CHOLECYSTECTOMY    . CYSTOCELE REPAIR N/A 03/19/2017   Procedure: ANTERIOR REPAIR (CYSTOCELE);  Surgeon: Bjorn Loser, MD;  Location: [facility];  Service: Urology;  Laterality: N/A;  . CYSTOSCOPY N/A 03/19/2017   Procedure: CYSTOSCOPY;  Surgeon: Bjorn Loser, MD;  Location: [facility];  Service: Urology;  Laterality: N/A;  . EYE SURGERY    . FOOT SURGERY    . HAND SURGERY    . HEMICOLECTOMY    . INSERTION OF MESH N/A 08/21/2016   Procedure: INSERTION OF MESH;  Surgeon: Excell Seltzer, MD;  Location: [facility];  Service: General;  Laterality: N/A;  . INSERTION OF MESH N/A 12/08/2019   Procedure: INSERTION  OF MESH;  Surgeon: Jules Husbands, MD;  Location: [facility];  Service: General;  Laterality: N/A;  . LAPAROSCOPIC LYSIS OF ADHESIONS N/A 08/21/2016   Procedure: LAPAROSCOPIC LYSIS OF ADHESIONS;  Surgeon: Excell Seltzer, MD;  Location: [facility];  Service: General;  Laterality: N/A;  . LAPAROSCOPIC REMOVAL ABDOMINAL MASS    . LAPAROTOMY N/A 04/16/2019   Procedure: EXPLORATORY LAPAROTOMY;  Surgeon: Benjamine Sprague, DO;  Location: [facility];  Service: General;  Laterality: N/A;  . LOOP RECORDER INSERTION N/A 03/21/2018    Procedure: LOOP RECORDER INSERTION;  Surgeon: Sanda Klein, MD;  Location: [place] [facility];  Service: Cardiovascular;  Laterality: N/A;  . LUMBAR [place]    . ROUX-EN-Y GASTRIC BYPASS  2010   Dr Hassell Done  . TONSILLECTOMY    . UPPER GI ENDOSCOPY  01/12/16   normal larynx, normal esophagus. gastric bypass with a normal-sized pouch and intact staple line, normal examined jejunum, otherwise exam was normal  . VENTRAL HERNIA REPAIR N/A 08/21/2016   Procedure: LAPAROSCOPIC VENTRAL HERNIA;  Surgeon: Excell Seltzer, MD;  Location: [facility];  Service: General;  Laterality: N/A;  . XI ROBOTIC ASSISTED VENTRAL HERNIA N/A 12/08/2019   Procedure: XI ROBOTIC ASSISTED VENTRAL HERNIA;  Surgeon: Jules Husbands, MD;  Location: [facility];  Service: General;  Laterality: N/A;    Prior to Admission medications   Medication Sig Start Date End Date Taking? Authorizing Provider  cephALEXin (KEFLEX) 250 MG/5ML suspension Take 10 mLs (500 mg total) by mouth 3 (three) times daily. 04/18/21  Yes Letitia Neri L, PA-C  potassium chloride (KLOR-CON) 10 MEQ tablet Take 1 tablet (10 mEq total) by mouth daily. 04/18/21  Yes Johnn Hai, PA-C  predniSONE (DELTASONE) 10 MG tablet Take 6 tablets  today, on day 2 take 5 tablets, day 3 take 4 tablets, day 4 take 3 tablets, day 5 take  2 tablets and 1 tablet the last day 04/18/21  Yes Madalyn Rob, Sukhdeep Wieting L, PA-C  cyclobenzaprine (FLEXERIL) 5 MG tablet TAKE 1 TABLET BY MOUTH 3 TIMES DAILY AS NEEDED. 12/19/20   Jerrol Banana., MD  gabapentin (NEURONTIN) 100 MG capsule TAKE 2 CAPSULES (200 MG TOTAL) BY MOUTH 3 (THREE) TIMES DAILY. Patient taking differently: Take 200 mg by mouth 2 (two) times daily. 01/24/21   Jerrol Banana., MD  hydrochlorothiazide (HYDRODIURIL) 25 MG tablet Take 1 tablet (25 mg total) by mouth daily. 04/06/21   Jerrol Banana., MD  LINZESS 290 MCG CAPS capsule TAKE 1 CAPSULE (290 MCG TOTAL) BY MOUTH DAILY BEFORE BREAKFAST. 04/04/21   Jerrol Banana., MD  metFORMIN (GLUCOPHAGE) 500 MG tablet TAKE 1 TABLET (500 MG TOTAL) BY MOUTH 2 (TWO) TIMES DAILY WITH A MEAL. Patient taking differently: Take 500 mg by mouth daily with breakfast. 01/10/21   Jerrol Banana., MD  methocarbamol (ROBAXIN) 500 MG tablet Take 500 mg by mouth 2 (two) times daily. Back pain 05/22/19   [provider]  metoprolol succinate (TOPROL-XL) 25 MG 24 hr tablet Take 1 tablet (25 mg total) by mouth daily. 04/06/21   Jerrol Banana., MD  omeprazole (PRILOSEC) 20 MG capsule TAKE 1 CAPSULE BY MOUTH EVERY DAY 11/18/20   Jerrol Banana., MD  ondansetron (ZOFRAN) 4 MG tablet Take 1 tablet (4 mg total) by mouth every 8 (eight) hours as needed. 03/20/21   Pabon, Diego F, MD  polyethylene glycol powder (GLYCOLAX/MIRALAX) 17 GM/SCOOP powder Take 17 g by mouth daily  as needed for moderate constipation. 04/04/21   Harvest Dark, MD  Prasterone (INTRAROSA) 6.5 MG INST Place 6.5 mg vaginally at bedtime. 02/08/21   Gae Dry, MD  traMADol (ULTRAM) 50 MG tablet Take 1 tablet (50 mg total) by mouth every 6 (six) hours as needed. 01/13/21   Jerrol Banana., MD  traZODone (DESYREL) 50 MG tablet TAKE 1 TABLET (50 MG TOTAL) BY MOUTH AT BEDTIME AS NEEDED. 08/17/20   Jerrol Banana., MD  Vaginal Lubricant (REPLENS) GEL Place 1 Applicatorful vaginally 3 (three) times a week. 11/30/20   Gae Dry, MD  diphenhydrAMINE (BENADRYL) 50 MG tablet Patient is to take one tablet 1 hour before test. Patient not taking: Reported on 04/28/2020 04/04/20 05/15/20  Jules Husbands, MD    Allergies Lotrel [amlodipine besy-benazepril hcl], Contrast media [iodinated diagnostic agents], Niacin, and Sulfa antibiotics  Family History  Problem Relation Age of Onset  . Cancer Father 61       Lung  . Coronary artery disease Father 6  . COPD Mother   . Diabetes Mother   . Hypertension Mother   . Cancer Paternal Grandmother        Breast  . Breast cancer  Paternal Grandmother   . Congestive Heart Failure Maternal Grandmother   . Emphysema Maternal Grandfather   . Heart attack Paternal Grandfather   . Cancer Paternal Aunt        Breast  . Breast cancer Paternal Aunt 38  . Breast cancer Paternal Aunt 17    Social History Social History   Tobacco Use  . Smoking status: Never Smoker  . Smokeless tobacco: Never Used  Vaping Use  . Vaping Use: Never used  Substance Use Topics  . Alcohol use: No  . Drug use: No    Review of Systems Constitutional: Positive fever/chills Eyes: No visual changes. ENT: Positive sore throat.  Positive bilateral ear pain. Cardiovascular: Denies chest pain. Respiratory: Denies shortness of breath.  Negative cough. Gastrointestinal: No abdominal pain.  No nausea, no vomiting.  No diarrhea.   Genitourinary: Negative for dysuria. Musculoskeletal: Negative for musculoskeletal pain. Skin: Negative for rash. Neurological: Negative for headaches, focal weakness or numbness.  ____________________________________________   PHYSICAL EXAM:  VITAL SIGNS: ED Triage Vitals  Enc Vitals Group     BP 04/18/21 0718 113/77     Pulse Rate 04/18/21 0718 82     Resp 04/18/21 0718 18     Temp 04/18/21 0720 98 F (36.7 C)     Temp Source 04/18/21 0718 Oral     SpO2 04/18/21 0718 99 %     Weight 04/18/21 0719 127 lb (57.6 kg)     Height 04/18/21 0719 5\' 3"  (1.6 m)     Head Circumference --      Peak Flow --      Pain Score 04/18/21 0719 10     Pain Loc --      Pain Edu? --      Excl. in [place]? --    Constitutional: Alert and oriented. Well appearing and in no acute distress. Eyes: Conjunctivae are normal.  Head: Atraumatic. Nose: No congestion/rhinnorhea. Ears: EACs are clear.  TMs are dull bilaterally but no erythema or injection is noted. Mouth/Throat: Mucous membranes are moist.  Uvula is edematous and erythematous.  No exudate is noted posterior pharynx. Neck: No stridor.  No cervical lymphadenopathy noted  on exam. Cardiovascular: Normal rate, regular rhythm. Grossly normal heart sounds.  Good peripheral  circulation. Respiratory: Normal respiratory effort.  No retractions. Lungs CTAB. Gastrointestinal: Soft and nontender. No distention.  Musculoskeletal: Moves upper and lower extremities without any difficulty and normal gait was noted. Neurologic:  Normal speech and language. No gross focal neurologic deficits are appreciated.  Skin:  Skin is warm, dry and intact. No rash noted. Psychiatric: Mood and affect are normal. Speech and behavior are normal.  ____________________________________________   LABS (all labs ordered are listed, but only abnormal results are displayed)  Labs Reviewed  CBC WITH DIFFERENTIAL/PLATELET - Abnormal; Notable for the following components:      Result Value   RBC 3.86 (*)    Hemoglobin 11.3 (*)    HCT 35.3 (*)    All other components within normal limits  BASIC METABOLIC PANEL - Abnormal; Notable for the following components:   Potassium 2.9 (*)    Creatinine, Ser 1.55 (*)    GFR, Estimated 36 (*)    All other components within normal limits  GROUP A STREP BY PCR     PROCEDURES  Procedure(s) performed (including Critical Care):  Procedures   ____________________________________________   INITIAL IMPRESSION / ASSESSMENT AND PLAN / ED COURSE  As part of my medical decision making, I reviewed the following data within the electronic MEDICAL RECORD NUMBER Notes from prior ED visits and [place] Controlled Substance Database  70 year old female presents to the ED with complaint of sore throat and bilateral ear pain for approximately 3 days.  On exam uvula is moderately edematous and erythematous.  No exudate was noted.  Patient was able to speak in complete sentences but states it increases her pain.  Patient was given IV Decadron and also covered with Rocephin 1 g IV.  Patient was noted to be hypokalemic and was given potassium tablet while in the ED.  Dr.  Kerman Passey  was also back to see the patient and reassured patient that this plan of care should help with her symptoms and her problem.  Strep test was negative, potassium was low at 2.9 patient was given K-Dur 20 mEq while in the ED.  He was discharged with prescription for Keflex 250 mg suspension 10 mL 3 times daily, potassium chloride 10 mEq daily and prednisone taper.  Patient is aware that she will need to be mindful of foods while she is taking the prednisone and as it will increase her blood sugar temporarily.  Patient is to return to the emergency department if she develops any fever, chills or difficulty swallowing or breathing.  She is to follow-up with Dr. Tami Ribas if not improving or further concerns needing evaluation. ____________________________________________   FINAL CLINICAL IMPRESSION(S) / ED DIAGNOSES  Final diagnoses:  Uvulitis  Hypokalemia     ED Discharge Orders         Ordered    cephALEXin (KEFLEX) 250 MG/5ML suspension  3 times daily        04/18/21 1240    potassium chloride (KLOR-CON) 10 MEQ tablet  Daily        04/18/21 1240    predniSONE (DELTASONE) 10 MG tablet        04/18/21 1245          *Please note:  VALESKA COURTADE was evaluated in Emergency Department on 04/18/2021 for the symptoms described in the history of present illness. She was evaluated in the context of the global COVID-19 pandemic, which necessitated consideration that the patient might be at risk for infection with the SARS-CoV-2 virus that causes COVID-19. Institutional protocols  and algorithms that pertain to the evaluation of patients at risk for COVID-19 are in a state of rapid change based on information released by regulatory bodies including the CDC and federal and state organizations. These policies and algorithms were followed during the patient's care in the ED.  Some ED evaluations and interventions may be delayed as a result of limited staffing during and the pandemic.*   Note:   This document was prepared using Dragon voice recognition software and may include unintentional dictation errors.    Johnn Hai, PA-C 04/18/21 1623    Harvest Dark, MD 04/19/21 2216

## 2021-04-18 NOTE — ED Triage Notes (Signed)
Pt c/o sore throat for the past 3 days with a fever.

## 2021-04-18 NOTE — Discharge Instructions (Signed)
Follow-up with Dr. Beverly Gust if any continued problems with your throat.  With the steroids and antibiotics you should begin seeing an improvement in the next 24 hours.  You may also take Tylenol as needed for pain.  Increase fluids and you may also use popsicles as tolerated.  As already discussed the over-the-counter throat spray can help temporarily with throat pain.  The steroids that were sent to your pharmacy also can cause increase in your blood sugar.  Be aware that you may see an increase temporarily while you are taking this medication but this will help with swelling of your uvula

## 2021-04-18 NOTE — ED Notes (Signed)
See triage note  Presents with a 3 day hx of sore throat  States fever for 1 day  Afebrile on arrival  Increased pain with swallowing

## 2021-04-18 NOTE — ED Provider Notes (Signed)
-----------------------------------------   11:08 AM on 04/18/2021 -----------------------------------------  Patient seen and examined in conjunction with physician assistant Letitia Neri.  Overall patient appears well.  My examination is also consistent with an edematous uvula consistent with uvulitis.  However it is still rather small in comparison with other cases of uvulitis they have seen in the past.  There is no concern for airway occlusion currently.  We will dose IV Decadron and cover with antibiotics.  Patient agreeable to plan of care.  I discussed return precautions.   Harvest Dark, MD 04/18/21 1109

## 2021-04-20 IMAGING — CT CT ABDOMEN AND PELVIS WITHOUT CONTRAST
2 of 4 series · 16 of 46 positions shown, 18 images · non-contrast
Comparison: 04/23/2016

CLINICAL DATA: 67-year-old female with abdominal pain

EXAM:
CT ABDOMEN AND PELVIS WITHOUT CONTRAST
TECHNIQUE: Multidetector CT imaging of the abdomen and pelvis was performed
following the standard protocol without IV contrast.

[Series 2: routine abd/pel wo · axial · 0.69mm/px · z∈[-494,-79]mm · 13 of 91 slices shown, 15 images]
[im 4/91  soft-tissue]
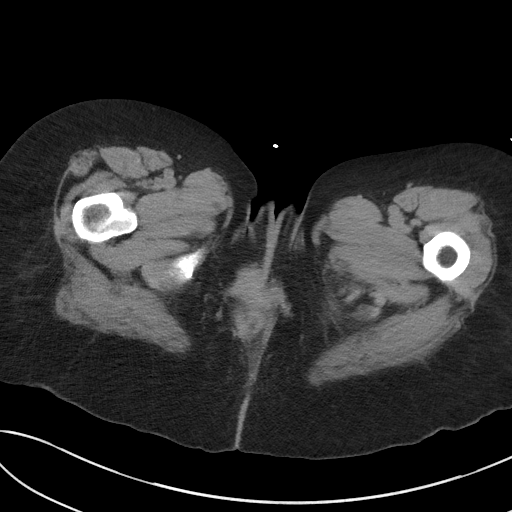
[im 4/91  bone]
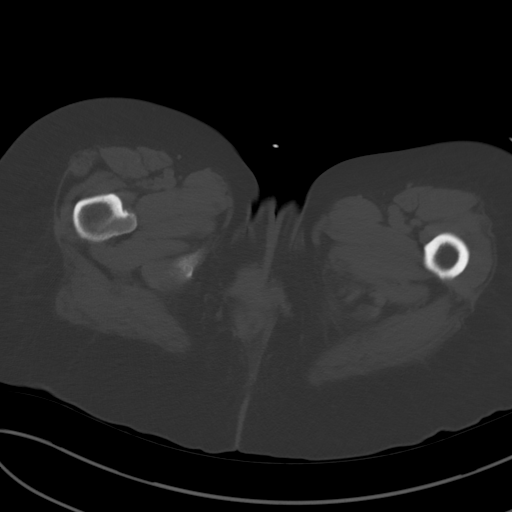
[im 12/91  soft-tissue]
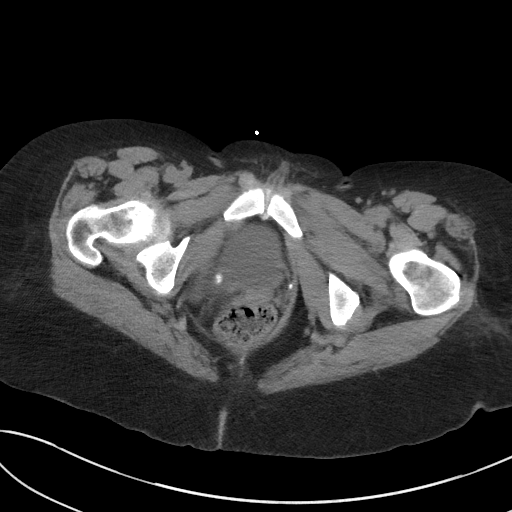
[im 20/91  soft-tissue]
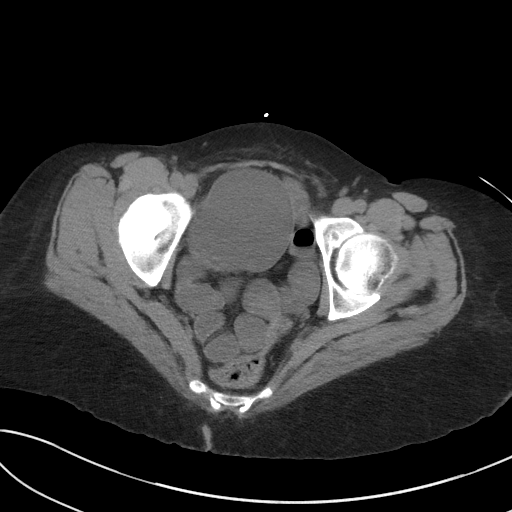
[im 24/91  soft-tissue]
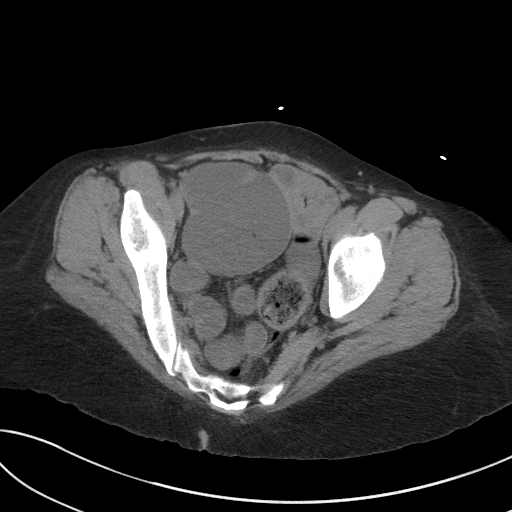
[im 32/91  soft-tissue]
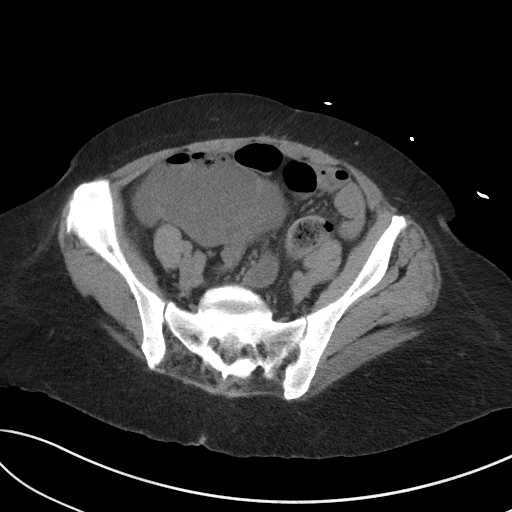
[im 40/91  soft-tissue]
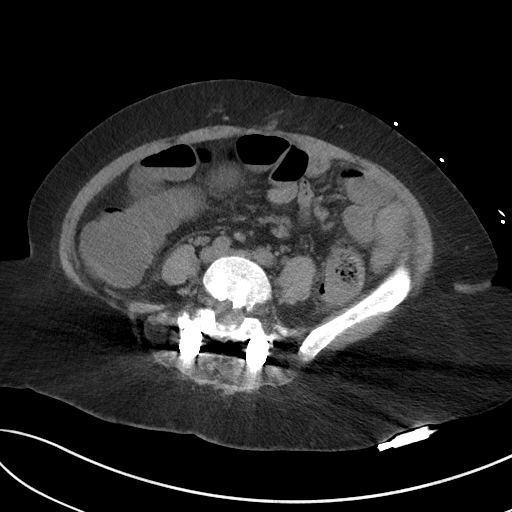
[im 47/91  soft-tissue]
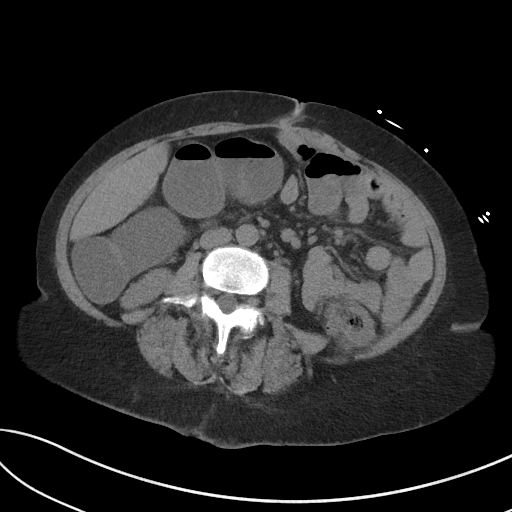
[im 51/91  soft-tissue]
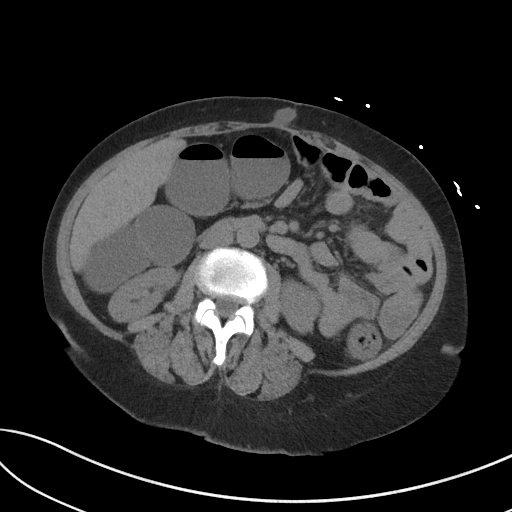
[im 59/91  soft-tissue]
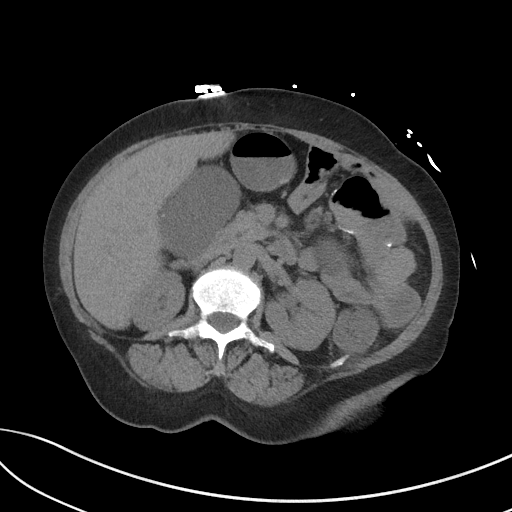
[im 59/91  bone]
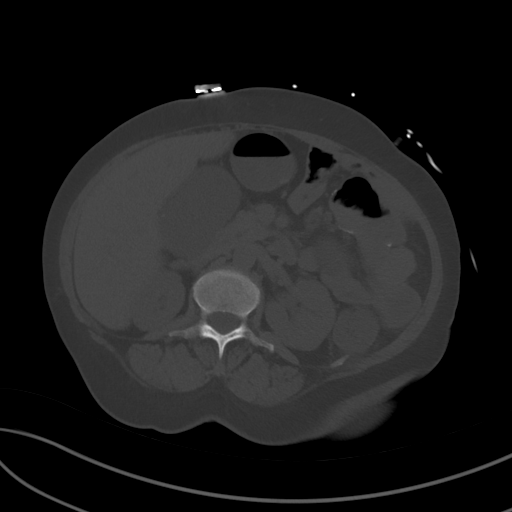
[im 67/91  soft-tissue]
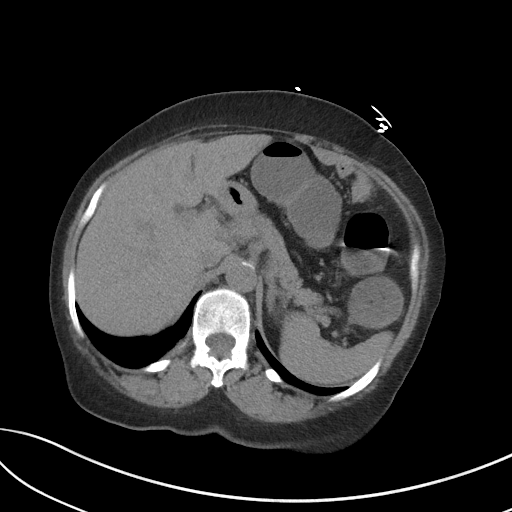
[im 71/91  soft-tissue]
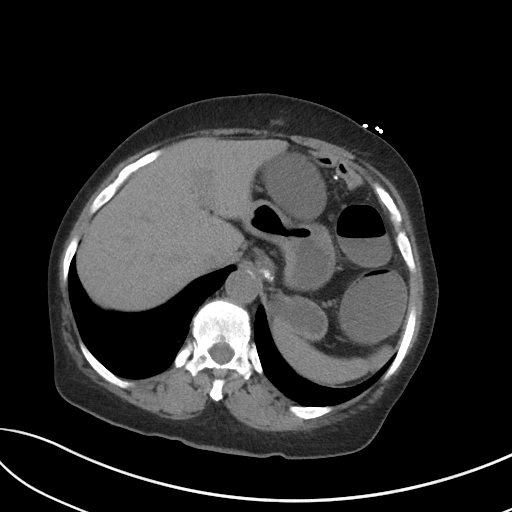
[im 79/91  soft-tissue]
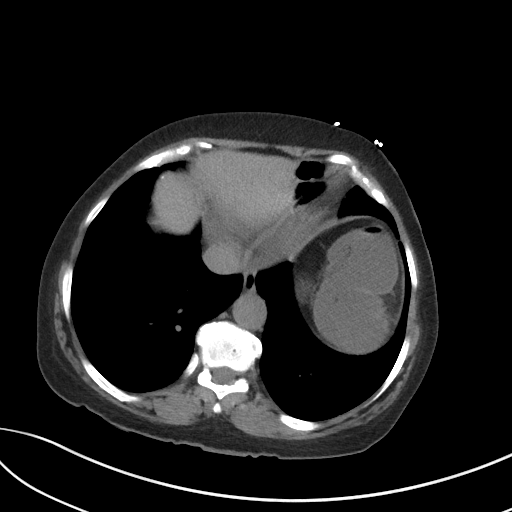
[im 87/91  soft-tissue]
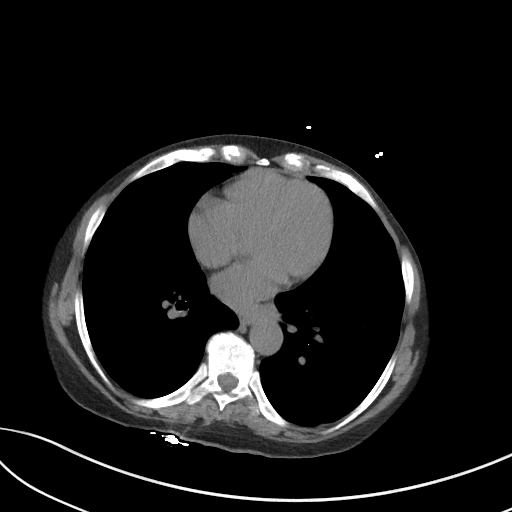

[Series 5: coronal st · coronal · 0.78mm/px · 3 of 87 slices shown]
[im 29/87  soft-tissue]
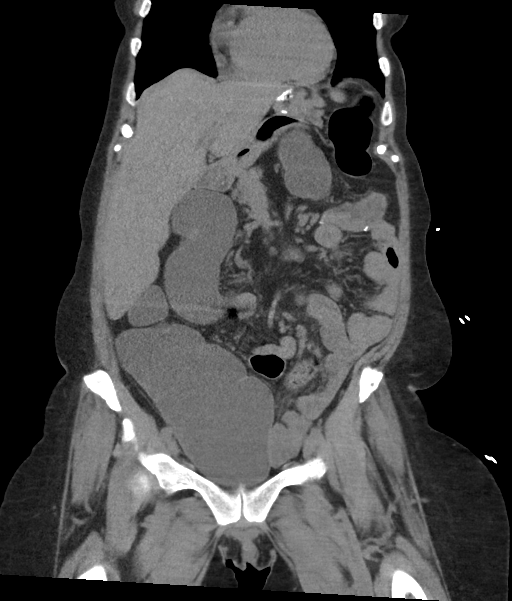
[im 39/87  soft-tissue]
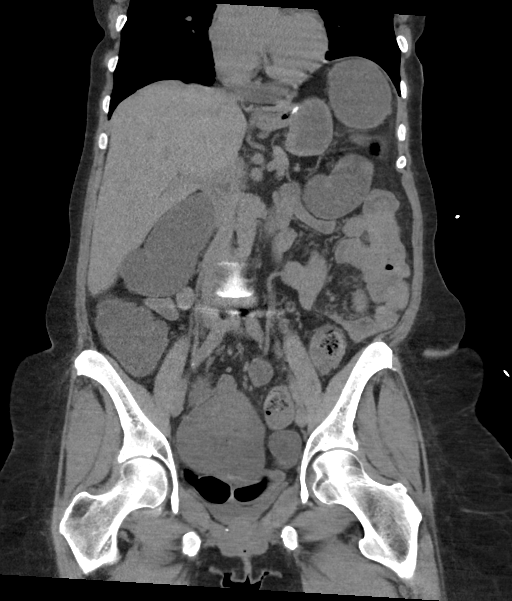
[im 48/87  soft-tissue]
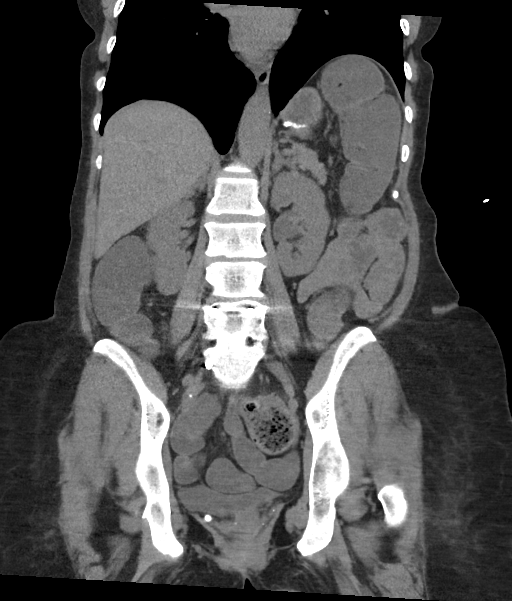

[16 of 46 positions shown; findings below may reference images not displayed]

FINDINGS: Lower chest: No acute finding of the lower chest

Hepatobiliary: Unremarkable liver. Cholecystectomy. No extrahepatic
biliary ductal dilatation.

Pancreas: Unremarkable

Spleen: Unremarkable

Adrenals/Urinary Tract: Unremarkable adrenal glands. Unremarkable
kidneys with no hydronephrosis or nephrolithiasis. No perinephric
stranding.

Stomach/Bowel: The colon is distended with fluid from the cecum
through the transverse colon and splenic flexure into the descending
colon. There is mixed formed stool in fluid within the sigmoid
colon, with a gradual tapering through the descending colon and the
sigmoid colon. The bowel is not well evaluated given the absence of
IV contrast, and so the study is nondiagnostic for evaluation of
bowel wall, or potentially luminal mass. There is questionable
pneumatosis in a few regions of the colonic wall there is narrowing
of sigmoid colon at the rectosigmoid junction, uncertain
significance. No focal inflammatory changes. Relatively moderate
stool burden, with no evidence of large rectal stool volume.

There is fluid-filled distal small bowel, borderline dilated. No
evidence of transition point of small bowel.

Surgical changes of the stomach.

Vascular/Lymphatic: Mild atherosclerosis.

Small lymph nodes of mesentery and retroperitoneal stations. Small
lymph nodes of the inguinal region.

Reproductive: Hysterectomy

Other: Surgical changes of the midline abdomen.

Musculoskeletal: No acute displaced fracture. Surgical changes of
L4-L5.
IMPRESSION: Colonic obstruction is suspected, with fluid-filled and distended
distal small bowel and proximal colon. Colon is distended throughout
its length to the descending colon. The etiology is uncertain, given
the absence of IV contrast, however, there is questionable
pneumatosis of the descending colon which may indicate ischemia, and
correlation with lab values may be useful. The current CT protocol
is nondiagnostic for the possibility of any obstructing endoluminal
mass or stricture. Surgical referral may be indicated as well as
consideration of contrast-enhanced CT for better evaluation of the
colon wall.

Surgical changes of the stomach.

Cholecystectomy.

Additional ancillary findings as above.

## 2021-04-21 LAB — CUP PACEART REMOTE DEVICE CHECK
Date Time Interrogation Session: 20220506013243
Implantable Pulse Generator Implant Date: 20190405

## 2021-04-21 IMAGING — DX PORTABLE ABDOMEN - 1 VIEW
1 series · 1 of 1 positions shown · non-contrast
Comparison: CT Abdomen and Pelvis 04/16/2019.

CLINICAL DATA: 67-year-old female postoperative day zero
exploratory laparotomy, lysis of adhesions for large bowel
obstruction. Enteric tube placement. Previous gastrojejunostomy.

EXAM:
PORTABLE ABDOMEN - 1 VIEW

[abdomen supine]
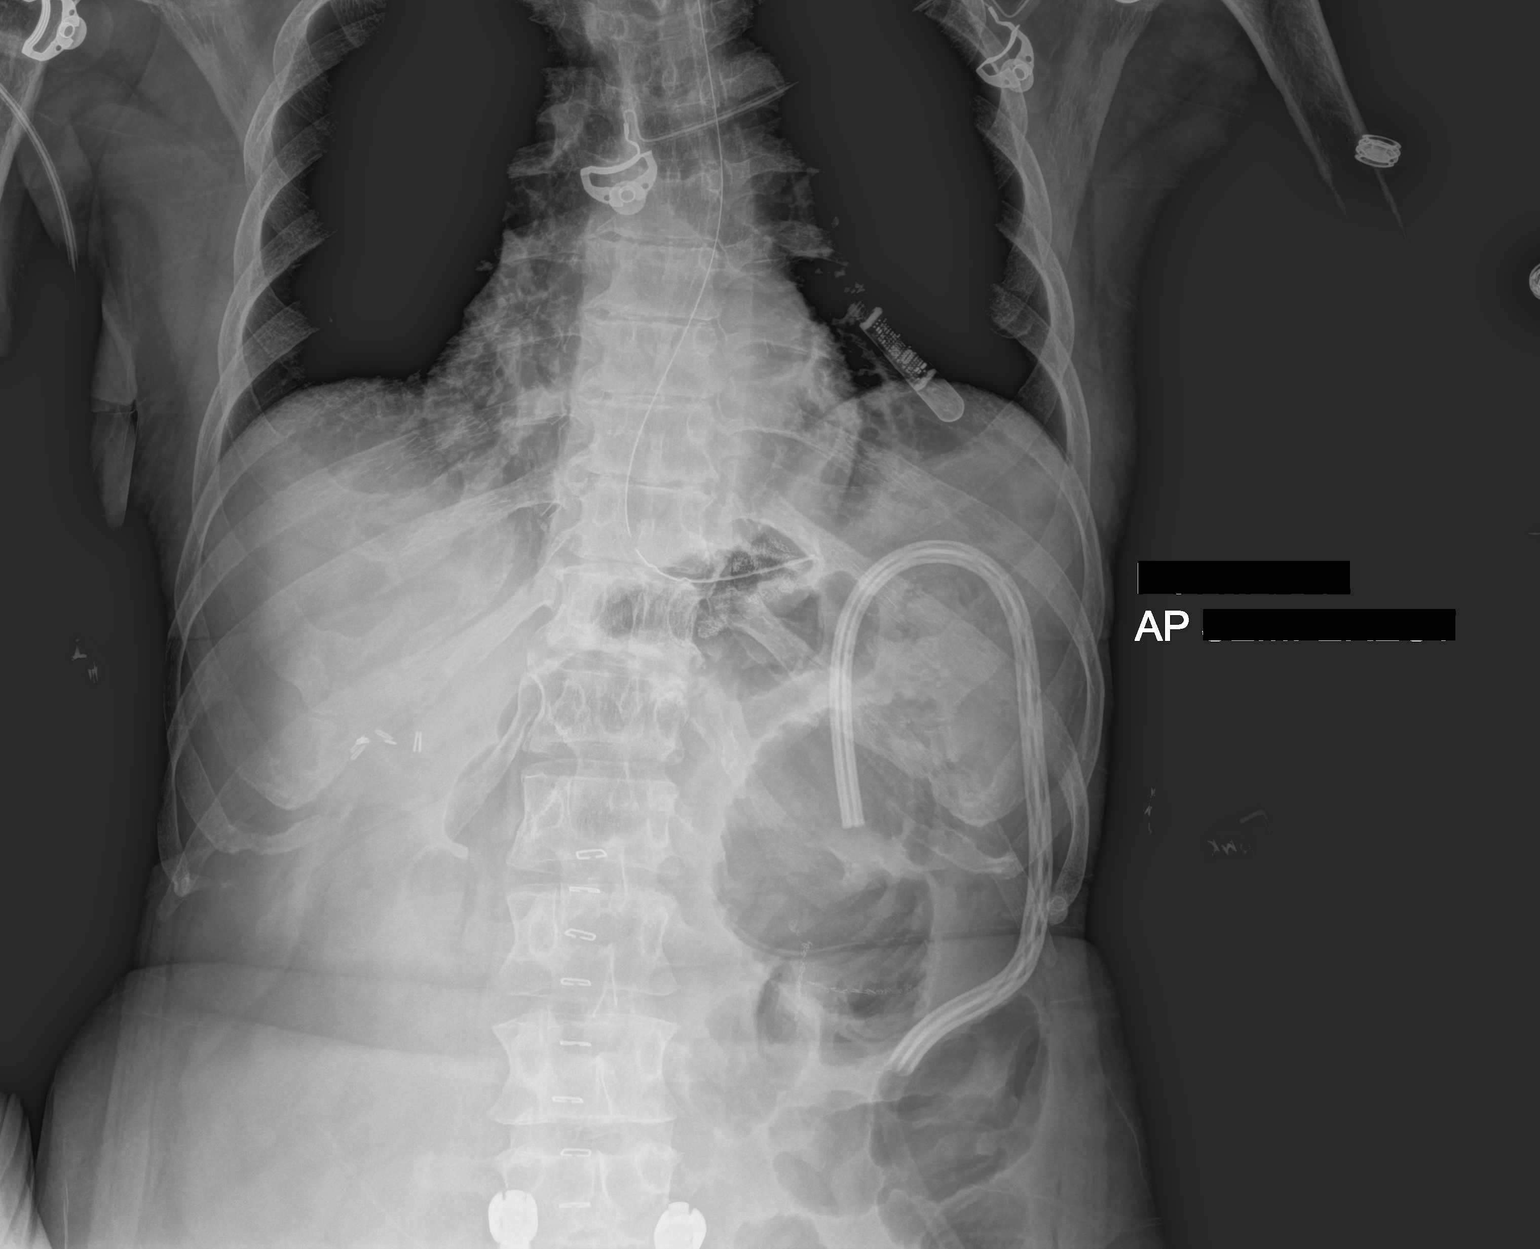

[1 of 1 positions shown; findings below may reference images not displayed]

FINDINGS: Portable AP semi upright view at 4508 hours. Enteric tube in place
with side hole at the level of the distal esophagus. Tip at the
level of the gastric pouch.

Left abdominal surgical drain. New midline skin staples. Stable
cholecystectomy clips.

Overpenetrated lungs. Left chest cardiac loop recorder.
IMPRESSION: 1. Enteric tube side hole at the level of the distal esophagus, and
tip at the level of the gastric pouch.
2. New postoperative changes to the abdomen with left abdominal
surgical drain in place.

## 2021-04-25 ENCOUNTER — Encounter: Payer: Self-pay | Admitting: Family Medicine

## 2021-04-25 ENCOUNTER — Other Ambulatory Visit: Payer: Self-pay

## 2021-04-25 ENCOUNTER — Ambulatory Visit (INDEPENDENT_AMBULATORY_CARE_PROVIDER_SITE_OTHER): Payer: Medicare HMO | Admitting: Family Medicine

## 2021-04-25 VITALS — BP 110/69 | HR 85 | Temp 98.2°F | Resp 18 | Ht 63.0 in | Wt 129.0 lb

## 2021-04-25 DIAGNOSIS — N1832 Chronic kidney disease, stage 3b: Secondary | ICD-10-CM | POA: Diagnosis not present

## 2021-04-25 DIAGNOSIS — J301 Allergic rhinitis due to pollen: Secondary | ICD-10-CM

## 2021-04-25 DIAGNOSIS — K122 Cellulitis and abscess of mouth: Secondary | ICD-10-CM

## 2021-04-25 DIAGNOSIS — E1122 Type 2 diabetes mellitus with diabetic chronic kidney disease: Secondary | ICD-10-CM

## 2021-04-25 DIAGNOSIS — I10 Essential (primary) hypertension: Secondary | ICD-10-CM

## 2021-04-25 NOTE — Progress Notes (Signed)
I,[name],acting as a scribe for [name], MD.,have documented all relevant documentation on the behalf of [name], MD,as directed by  [name], MD while in the presence of [name], MD.  Established patient visit   Patient: Julie Acevedo   DOB: 20-Apr-1951   [name]  MRN: [id number] Visit Date: 04/25/2021  Today's healthcare provider: [name], MD   Chief Complaint  Patient presents with  . Hospitalization Follow-up   Subjective    HPI  Patient is feeling better since her ER visit last week for uvulitis. Patient does have an allergy symptoms with nasal congestion and refill on those she has water in her ears. Follow up ER visit  Patient was seen in ER for sore throat on 04/18/2021. She was treated for Uvulitis and hypokalemia. Treatment for this included Rocephin 1g IV and K-Dur 20 mEq in the ER. Patient was discharged with Keflex 250mg  suspension.  She reports good compliance with treatment. She reports this condition is Improved.  Patient states she has improved somewhat. However she is still having slightly sore throat.      Medications: Outpatient Medications Prior to Visit  Medication Sig  . cyclobenzaprine (FLEXERIL) 5 MG tablet TAKE 1 TABLET BY MOUTH 3 TIMES DAILY AS NEEDED.  Marland Kitchen gabapentin (NEURONTIN) 100 MG capsule TAKE 2 CAPSULES (200 MG TOTAL) BY MOUTH 3 (THREE) TIMES DAILY. (Patient taking differently: Take 200 mg by mouth 2 (two) times daily.)  . hydrochlorothiazide (HYDRODIURIL) 25 MG tablet Take 1 tablet (25 mg total) by mouth daily.  Marland Kitchen LINZESS 290 MCG CAPS capsule TAKE 1 CAPSULE (290 MCG TOTAL) BY MOUTH DAILY BEFORE BREAKFAST.  . metFORMIN (GLUCOPHAGE) 500 MG tablet TAKE 1 TABLET (500 MG TOTAL) BY MOUTH 2 (TWO) TIMES DAILY WITH A MEAL. (Patient taking differently: Take 500 mg by mouth daily with breakfast.)  . methocarbamol (ROBAXIN) 500 MG tablet Take 500 mg by mouth 2 (two) times daily. Back pain   . metoprolol succinate (TOPROL-XL) 25 MG 24 hr tablet Take 1 tablet (25 mg total) by mouth daily.  Marland Kitchen omeprazole (PRILOSEC) 20 MG capsule TAKE 1 CAPSULE BY MOUTH EVERY DAY  . ondansetron (ZOFRAN) 4 MG tablet Take 1 tablet (4 mg total) by mouth every 8 (eight) hours as needed.  . polyethylene glycol powder (GLYCOLAX/MIRALAX) 17 GM/SCOOP powder Take 17 g by mouth daily as needed for moderate constipation.  . potassium chloride (KLOR-CON) 10 MEQ tablet Take 1 tablet (10 mEq total) by mouth daily.  . Prasterone (INTRAROSA) 6.5 MG INST Place 6.5 mg vaginally at bedtime.  . traMADol (ULTRAM) 50 MG tablet Take 1 tablet (50 mg total) by mouth every 6 (six) hours as needed.  . traZODone (DESYREL) 50 MG tablet TAKE 1 TABLET (50 MG TOTAL) BY MOUTH AT BEDTIME AS NEEDED.  . Vaginal Lubricant (REPLENS) GEL Place 1 Applicatorful vaginally 3 (three) times a week.  . cephALEXin (KEFLEX) 250 MG/5ML suspension Take 10 mLs (500 mg total) by mouth 3 (three) times daily. (Patient not taking: Reported on 04/25/2021)  . predniSONE (DELTASONE) 10 MG tablet Take 6 tablets  today, on day 2 take 5 tablets, day 3 take 4 tablets, day 4 take 3 tablets, day 5 take  2 tablets and 1 tablet the last day (Patient not taking: Reported on 04/25/2021)   No facility-administered medications prior to visit.    Review of Systems  Constitutional: Negative for activity change, chills, fatigue and fever.  Respiratory: Negative for cough and shortness of breath.   Cardiovascular: Negative for chest pain, palpitations and leg swelling.  Neurological: Negative for dizziness, light-headedness and headaches.        Objective    BP 110/69 (BP Location: Right Arm, Patient Position: Sitting, Cuff Size: Normal)   Pulse 85   Temp 98.2 F (36.8 C) (Oral)   Resp 18   Ht 5\' 3"  (1.6 m)   Wt 129 lb (58.5 kg)   SpO2 98%   BMI 22.85 kg/m  BP Readings from Last 3 Encounters:  04/25/21 110/69  04/18/21 120/72  04/11/21 100/60   Wt Readings  from Last 3 Encounters:  04/25/21 129 lb (58.5 kg)  04/18/21 127 lb (57.6 kg)  04/11/21 130 lb (59 kg)       Physical Exam Vitals reviewed.  Constitutional:      Appearance: She is well-developed.  HENT:     Head: Normocephalic and atraumatic.     Right Ear: Tympanic membrane and external ear normal.     Left Ear: Tympanic membrane and external ear normal.     Ears:     Comments: Both TMs are full.    Nose: Nose normal.     Mouth/Throat:     Mouth: Mucous membranes are moist.     Pharynx: Oropharynx is clear.     Comments: Normal exam with normal uvula Eyes:     General: No scleral icterus.    Conjunctiva/sclera: Conjunctivae normal.     Pupils: Pupils are equal, round, and reactive to light.  Neck:     Thyroid: No thyromegaly.  Cardiovascular:     Rate and Rhythm: Normal rate and regular rhythm.     Heart sounds: Normal heart sounds.  Pulmonary:     Effort: Pulmonary effort is normal.     Breath sounds: Normal breath sounds.  Abdominal:     Palpations: Abdomen is soft.  Lymphadenopathy:     Cervical: No cervical adenopathy.  Skin:    General: Skin is warm and dry.  Neurological:     General: No focal deficit present.     Mental Status: She is alert and oriented to person, place, and time.  Psychiatric:        Mood and Affect: Mood normal.        Behavior: Behavior normal.        Thought Content: Thought content normal.        Judgment: Judgment normal.       No results found for any visits on 04/25/21.  Assessment & Plan     1. Seasonal allergic rhinitis due to pollen With antihistamine and nasal steroids  2. Uvulitis This is resolved.  3. Benign essential HTN Good control on HydroDIURIL and metoprolol  4. Type 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, without long-term current use of insulin (HCC) Controlled with diet exercise and weight loss.  Last A1c was 5.5.   Return in about 4 months (around 08/26/2021).      I, [name], MD, have reviewed all documentation for this visit. The documentation on 04/29/21 for the exam, diagnosis, procedures, and orders are all accurate and complete.    Jalynn Waddell Cranford Mon, MD  [facility] [phone] (phone) [phone] (fax)  [place]

## 2021-04-26 ENCOUNTER — Other Ambulatory Visit: Payer: Self-pay

## 2021-04-26 NOTE — Patient Outreach (Signed)
Aging Gracefully Program  04/26/2021  Julie Acevedo Oct 11, 1951 [id number]  [facility] Evaluation Interviewer made contact with patient. Aging Gracefully initial survey completed.   Interviewer will send referral to [name], RN and OT for follow up.  [facility] Management Assistant [phone]

## 2021-04-28 DIAGNOSIS — R0789 Other chest pain: Secondary | ICD-10-CM | POA: Diagnosis not present

## 2021-04-28 DIAGNOSIS — R079 Chest pain, unspecified: Secondary | ICD-10-CM | POA: Diagnosis not present

## 2021-05-04 DIAGNOSIS — R809 Proteinuria, unspecified: Secondary | ICD-10-CM | POA: Insufficient documentation

## 2021-05-04 DIAGNOSIS — E1122 Type 2 diabetes mellitus with diabetic chronic kidney disease: Secondary | ICD-10-CM | POA: Diagnosis not present

## 2021-05-04 DIAGNOSIS — E876 Hypokalemia: Secondary | ICD-10-CM | POA: Insufficient documentation

## 2021-05-04 DIAGNOSIS — N1832 Chronic kidney disease, stage 3b: Secondary | ICD-10-CM | POA: Diagnosis not present

## 2021-05-04 DIAGNOSIS — I10 Essential (primary) hypertension: Secondary | ICD-10-CM | POA: Diagnosis not present

## 2021-05-04 DIAGNOSIS — D631 Anemia in chronic kidney disease: Secondary | ICD-10-CM | POA: Insufficient documentation

## 2021-05-04 DIAGNOSIS — E785 Hyperlipidemia, unspecified: Secondary | ICD-10-CM | POA: Diagnosis not present

## 2021-05-04 DIAGNOSIS — D509 Iron deficiency anemia, unspecified: Secondary | ICD-10-CM | POA: Diagnosis not present

## 2021-05-05 NOTE — Progress Notes (Signed)
[facility] Summary Report / Loop Recorder 

## 2021-05-11 ENCOUNTER — Telehealth: Payer: Self-pay | Admitting: Family Medicine

## 2021-05-11 ENCOUNTER — Other Ambulatory Visit: Payer: Self-pay | Admitting: *Deleted

## 2021-05-11 DIAGNOSIS — N6320 Unspecified lump in the left breast, unspecified quadrant: Secondary | ICD-10-CM

## 2021-05-11 NOTE — Telephone Encounter (Signed)
Orders placed for diagnostic MM.

## 2021-05-11 NOTE — Telephone Encounter (Signed)
Pt found lump in left breast / [place] [facility] advised pt to get Dr. Rosanna Randy to send an order asap / Pt would like to know if this can be done before her appt with Dr. Rosanna Randy / please advise Pt of status asap

## 2021-05-11 NOTE — Telephone Encounter (Signed)
Pt is calling to request to call to request ultrasound on Left breast at [facility] breast center. Please [phone]

## 2021-05-11 NOTE — Telephone Encounter (Signed)
Patient called back and said that the breast center stated she also needs an order for an ultra sound of her left breast as well.  Please advise.

## 2021-05-12 ENCOUNTER — Other Ambulatory Visit: Payer: Self-pay | Admitting: *Deleted

## 2021-05-12 DIAGNOSIS — N6320 Unspecified lump in the left breast, unspecified quadrant: Secondary | ICD-10-CM

## 2021-05-12 NOTE — Telephone Encounter (Signed)
[place] ordered also.

## 2021-05-17 ENCOUNTER — Ambulatory Visit
Admission: RE | Admit: 2021-05-17 | Discharge: 2021-05-17 | Disposition: A | Discharge status: Home / Self Care | Payer: Medicare HMO | Source: Ambulatory Visit | Attending: Family Medicine | Admitting: Family Medicine

## 2021-05-17 ENCOUNTER — Other Ambulatory Visit: Payer: Self-pay

## 2021-05-17 DIAGNOSIS — R922 Inconclusive mammogram: Secondary | ICD-10-CM | POA: Diagnosis not present

## 2021-05-17 DIAGNOSIS — N6489 Other specified disorders of breast: Secondary | ICD-10-CM | POA: Diagnosis not present

## 2021-05-17 DIAGNOSIS — N6320 Unspecified lump in the left breast, unspecified quadrant: Secondary | ICD-10-CM | POA: Diagnosis present

## 2021-05-17 DIAGNOSIS — N6322 Unspecified lump in the left breast, upper inner quadrant: Secondary | ICD-10-CM | POA: Diagnosis not present

## 2021-05-17 DIAGNOSIS — Z95818 Presence of other cardiac implants and grafts: Secondary | ICD-10-CM | POA: Insufficient documentation

## 2021-05-18 ENCOUNTER — Ambulatory Visit (INDEPENDENT_AMBULATORY_CARE_PROVIDER_SITE_OTHER): Payer: Medicare HMO

## 2021-05-18 ENCOUNTER — Other Ambulatory Visit: Payer: Self-pay | Admitting: Family Medicine

## 2021-05-18 ENCOUNTER — Ambulatory Visit: Payer: Medicare HMO | Admitting: Podiatry

## 2021-05-18 DIAGNOSIS — R55 Syncope and collapse: Secondary | ICD-10-CM | POA: Diagnosis not present

## 2021-05-18 DIAGNOSIS — M545 Low back pain, unspecified: Secondary | ICD-10-CM

## 2021-05-18 NOTE — Telephone Encounter (Signed)
Requested medication (s) are due for refill today -yes  Requested medication (s) are on the active medication list -yes  Future visit scheduled -yes  Last refill: 12/19/20 #60 3 RF  Notes to clinic: Request RF- non delegated Rx  Requested Prescriptions  Pending Prescriptions Disp Refills   cyclobenzaprine (FLEXERIL) 5 MG tablet [Pharmacy Med Name: CYCLOBENZAPRINE 5 MG TABLET] 60 tablet 3    Sig: TAKE 1 TABLET BY MOUTH THREE TIMES A DAY AS NEEDED      Not Delegated - Analgesics:  Muscle Relaxants Failed - 05/18/2021  4:03 PM      Failed - This refill cannot be delegated      Passed - Valid encounter within last 6 months    Recent Outpatient Visits           3 weeks ago Seasonal allergic rhinitis due to pollen   [facility] Rosanna Randy, Retia Passe., MD   2 months ago Diabetes mellitus due to underlying condition with diabetic nephropathy, without long-term current use of insulin ([place])   [facility] Jerrol Banana., MD   5 months ago Urinary symptom or sign   [facility] Flinchum, Kelby Aline, FNP   5 months ago Cognitive impairment   [facility] Rosanna Randy, Retia Passe., MD   8 months ago Low back pain radiating to right lower extremity   [facility] Jerrol Banana., MD       Future Appointments             In 1 week Jerrol Banana., MD [facility], [place]   In 3 months Jerrol Banana., MD [facility]                 Requested Prescriptions  Pending Prescriptions Disp Refills   cyclobenzaprine (FLEXERIL) 5 MG tablet [Pharmacy Med Name: CYCLOBENZAPRINE 5 MG TABLET] 60 tablet 3    Sig: TAKE 1 TABLET BY MOUTH THREE TIMES A DAY AS NEEDED      Not Delegated - Analgesics:  Muscle Relaxants Failed - 05/18/2021  4:03 PM      Failed - This refill cannot be delegated      Passed - Valid encounter within last 6 months    Recent Outpatient Visits            3 weeks ago Seasonal allergic rhinitis due to pollen   [facility] Rosanna Randy, Retia Passe., MD   2 months ago Diabetes mellitus due to underlying condition with diabetic nephropathy, without long-term current use of insulin [facility])   [facility] Jerrol Banana., MD   5 months ago Urinary symptom or sign   [facility] Flinchum, Kelby Aline, FNP   5 months ago Cognitive impairment   [facility] Jerrol Banana., MD   8 months ago Low back pain radiating to right lower extremity   [facility] Jerrol Banana., MD       Future Appointments             In 1 week Jerrol Banana., MD [facility], [place]   In 3 months Jerrol Banana., MD [facility], [place]

## 2021-05-24 LAB — CUP PACEART REMOTE DEVICE CHECK
Date Time Interrogation Session: 20220608013512
Implantable Pulse Generator Implant Date: 20190405

## 2021-05-25 ENCOUNTER — Other Ambulatory Visit: Payer: Self-pay | Admitting: Occupational Therapy

## 2021-05-25 ENCOUNTER — Other Ambulatory Visit: Payer: Self-pay

## 2021-05-25 NOTE — Patient Outreach (Signed)
Aging Gracefully Program  OT Initial Visit  05/25/2021  Julie Acevedo [birth year] [id number]  Visit:  1- Initial Visit  Start Time:  0900 End Time:  0945 Total Minutes:  17  CCAP: Typical Daily Routine: What Types Of Care Problems Are You Having Throughout The Day?: Getting up and down steps, getting down into/out of tub, stepping over tub, What Kind Of Help Do You Receive?: none Do You Think You Need Other Types Of Help?: Could really use someone to help do the housework at times What Do You Think Would Make Everyday Life Easier For You?: gtab bars in tub, grab bar a toilet, rails at [place] door and patio door off of laundry room, a tub seat to use when needed, a reacher to help pick things up that I drop, something to help me get out of car What Is A Good Day Like?: Less pain What Is A Bad Day Like?: More pain Do You Have Time For Yourself?: yes Patient Reported Equipment: Patient Reported Equipment Currently Used: Single [employer] (lift chair) Functional Mobility-Maintain Balance While Showering: Maintaining Balance While Showering: Moderate Difficulty Do You:: No Device/No Assistance (sits down in tub) Importance Of Learning New Strategies:: Moderate Intervention: Yes Other Comments:: tub seat, hand held shower Functional Mobility-Stooping, Crouching, Kneeling To Retreive Item: Stooping, Crouching, or Kneeling To Retrieve Item: Moderate Difficulty Do You:: No Device/No Assistance Importance Of Learning New Strategies:: Moderate Intervention: Yes Other Comments:: reacher Functional Mobility-Bending From Standing Position To Pick Up Clothing Off The Floor: Bending Over From Standing Position To Pick Up Clothing Off The Floor: Moderate Difficulty Do You:: No Device/No Assistance Importance Of Learning New Strategies:: Moderate Intervention: Yes Other Comments:: reacher Functional Mobility-Move In And Out Of Chair: Move In and Out Of A Chair: Moderate Difficulty Do You:: No  Device/No Assistance Importance Of Learning New Strategies:: A Little Intervention: No Functional Mobility-Move In And Out Of Bed: Move In and Out Of Bed: Moderate Difficulty Do You:: No Device/No Assistance Importance Of Learning New Strategies:: Moderate Intervention:  (maybe) Other Comments:: bedrail? Functional Mobility-Move In And Out Of Bath/Shower: Move In And Out Of A Bath/Shower: Moderate Difficulty Do You:: Use A Device Importance Of Learning New Strategies:: Moderate Observation: Move In And Out Of Bath/Shower: Independent With Pain, Difficulty, Or Use Of Device Safety: Moderate/Extreme Risk Efficiency: Somewhat Intervention: Yes Other Comments:: grab bars, shower seat, hand held shower Functional Mobility-Get On And Off Toilet: Getting Up From The Floor: Unable To Do Other Comments:: Has a life line button Functional Mobility-Into And Out Of Car, Not Including Driving: Into  And Out Of Car, Not Including Driving: Moderate Difficulty Do You:: No Device/No Assistance Importance Of Learning New Strategies:: Moderate Intervention: Yes Other Comments:: car handle  Readiness To Change Score:  Readiness to Change Score: 9  Home Environment Assessment: Outside Home Entry:: Steps are fairly high from one step to next and no rail is on steps Stairs:: Stairs to back patio do not have a rail and they too are fairly high between steps Bathroom:: The bathroom she uses has a tub/shower combination with glass door, she holds onto wall with one hand as she steps in with one leg then reaches with other hand for soap dish bar on far wall and then steps in with other leg. She then holds onto soap dish bar with one hand and puts her other hand in the far back left upper corner of tub and pivots around to sit down in tub, She reverses  the process to get up from tub and step out. Smoke/CO2 Detector:: Reports smoke detector does not work Veterinary surgeon:: Does not have one Photographer Concerns:: back door to patio from kitchen table will not stay closed unless deadbolt is locked, it is a keyed deadbolt and key could not be found while I was there. There is a hole in the wall at front door where door knob went into wall (needs a door stop and hole patched)   Patient Education: Education Provided: Yes Education Details: "How to get up from a fall" handout and "Safety at [place]" handout Person(s) Educated: Patient Comprehension:  (She will review information given)  Goals:  Goals Addressed             This Visit's Progress    Patient Stated       She would like to feel safer in bathroom (grab bars, hand held shower, tub seat all would help this)      Patient Stated       She would like it to be easier to get in and out of the bed (a bed rail will help with this)      Patient Stated       She would like to be able to pick item up off the floor easier ( a reacher will help)      Patient Stated       She would like to feel more safe with getting into and out of her house (handrails and possibly making steps not as high at two doors)         Post Clinical Reasoning: Clinician View Of Client Situation:: Julie Acevedo gets around as best as she can, 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 Pain was observed especially with transition movements (ie: sit<>stand). She reports her issues are hereditary, She gets down in her tub at home ( as opposed to sitting on a seat and showering) because she likes to soak. She does the cooking in incremental steps (resting in between), puts dishes in dishwasher, sweeps, and does the laundry.She has COVID in Dec of 2021 and reports she still has some residuals from it. Reports she was very sick wtih breathing difficulties but did not go to the hospital. Client View Of His/Her Situation:: "I have lived with the pain for so long now, I've just learned to deal with it and do what I can when  I can". She reports issues with getting up and down steps to carport and back patio as well as struggling to get down into and out of her tub--"but I do it because I like to soak". Reports she is open to a tub seat to use when she may need it. Next Visit Plan:: tub seat or reacher or bedrail  [place] [address], OTR/L Aging Gracefully [phone]

## 2021-05-25 NOTE — Patient Instructions (Signed)
Goals Addressed             This Visit's Progress    Patient Stated       She would like to feel safer in bathroom (grab bars, hand held shower, tub seat all would help this)      Patient Stated       She would like it to be easier to get in and out of the bed (a bed rail will help with this)      Patient Stated       She would like to be able to pick item up off the floor easier ( a reacher will help)      Patient Stated       She would like to feel more safe with getting into and out of her house (handrails and possibly making steps not as high at two doors)

## 2021-05-26 ENCOUNTER — Telehealth: Payer: Self-pay

## 2021-05-26 NOTE — Progress Notes (Signed)
Spoke to patient to confirmed patient telephone appointment on 05/29/2021 for CCM at 1:00 pm with [name] the Clinical pharmacist.   Patient Verbalized understanding.Patient aware to have all medications, supplements, blood pressure and blood sugar logs to visit.  Questions: Have you had any recent office visit or specialist visit outside of [facility]? No Are there any concerns you would like to discuss during your office visit? NO Are you having any problems obtaining your medications? No   Star Rating Drug: Metformin 500 last filled on 04/06/2021 for [facility] day supply at [facility]  Any gaps in medications fill history? None ID   Anderson [place] Clinical Pharmacist Assistant [id number]

## 2021-05-29 ENCOUNTER — Ambulatory Visit (INDEPENDENT_AMBULATORY_CARE_PROVIDER_SITE_OTHER): Payer: Medicare HMO

## 2021-05-29 ENCOUNTER — Other Ambulatory Visit: Payer: Self-pay | Admitting: Oncology

## 2021-05-29 DIAGNOSIS — N1832 Chronic kidney disease, stage 3b: Secondary | ICD-10-CM

## 2021-05-29 DIAGNOSIS — E1159 Type 2 diabetes mellitus with other circulatory complications: Secondary | ICD-10-CM | POA: Diagnosis not present

## 2021-05-29 DIAGNOSIS — E1122 Type 2 diabetes mellitus with diabetic chronic kidney disease: Secondary | ICD-10-CM

## 2021-05-29 DIAGNOSIS — I152 Hypertension secondary to endocrine disorders: Secondary | ICD-10-CM | POA: Diagnosis not present

## 2021-05-29 NOTE — Progress Notes (Signed)
Venofer Treatment plan in.   Faythe Casa, NP 05/29/2021 7:27 PM

## 2021-05-29 NOTE — Progress Notes (Signed)
Chronic Care Management Pharmacy Note  05/29/2021 Name:  Julie Acevedo MRN:  [id number] DOB:  Dec 16, 1951  Summary: Patient reports she is doing well overall, she does continue to have postural dizziness. She is Worried about results of MRI scan.   Recommendations/Changes made from today's visit: Continue Current Medications  Plan: CPP follow-up in 2 weeks  Subjective: Julie Acevedo is an 70 y.o. year old female who is a primary patient of Jerrol Banana., MD.  The CCM team was consulted for assistance with disease management and care coordination needs.    Engaged with patient by telephone for follow up visit in response to provider referral for pharmacy case management and/or care coordination services.   Consent to Services:  The patient was given information about Chronic Care Management services, agreed to services, and gave verbal consent prior to initiation of services.  Please see initial visit note for detailed documentation.   Patient Care Team: Jerrol Banana., MD as PCP - General (Family Medicine) Yolonda Kida, MD as PCP - Cardiology (Cardiology) Lorelee Cover., MD as Consulting Physician (Ophthalmology) Bjorn Loser, MD as Consulting Physician (Urology) Bary Castilla, Forest Gleason, MD as Consulting Physician (General Surgery) Jules Husbands, MD as Consulting Physician (General Surgery) Gardiner Barefoot, DPM as Consulting Physician (Podiatry) Gae Dry, MD as Referring Physician (Obstetrics and Gynecology) Vladimir Crofts, MD as Consulting Physician (Neurology) Randon Goldsmith, OT as Occupational Therapist (Occupational Therapy)  Recent office visits: 04/25/21: Patient presented to Dr. Rosanna Randy for allergic rhinitis. No medication changes noted.  03/06/21: Patient presented to Dr. Rosanna Randy for follow-up. Patient referred to nephrology 12/15/20: Patient presented to Laverna Peace, FNP for UTI. Patient started on cephalexin 500 mg twice daily.   11/30/20: Patient presented to Dr. Rosanna Randy for follow-up. Patient referred to neurology for cognitive impairment. Mupirocin stopped (no longer taking)   Recent consult visits: 05/04/21: Patient presented to Dr. Lanora Manis (Nephrology). HCTZ decreased to 12.5 mg daily.  02/09/21: Patient presented to Dr. [name] (neurology) for cognitive impairment. MRI ordered. Recommended Vitamin B12 injections + PO, folic acid.   Hospital visits: 04/18/21: Patient presented to ED for Uvulitis 04/04/21: Patient presented to ED for abdominal pain  03/10/21: Patient presented to ED for ankle swelling   Objective:  Lab Results  Component Value Date   CREATININE 1.55 (H) 04/18/2021   BUN 19 04/18/2021   GFRNONAA 36 (L) 04/18/2021   GFRAA 42 (L) 12/05/2020   NA 140 04/18/2021   K 2.9 (L) 04/18/2021   CALCIUM 8.9 04/18/2021   CO2 29 04/18/2021    Lab Results  Component Value Date/Time   HGBA1C 5.5 03/06/2021 04:26 PM   HGBA1C 5.6 12/05/2020 08:55 AM   HGBA1C 6.3 01/18/2014 06:32 AM   MICROALBUR 20 06/23/2020 02:10 PM   MICROALBUR 50 11/18/2017 09:33 AM    Last diabetic Eye exam:  Lab Results  Component Value Date/Time   HMDIABEYEEXA No Retinopathy 12/03/2019 12:00 AM    Last diabetic Foot exam: No results found for: HMDIABFOOTEX   Lab Results  Component Value Date   CHOL 163 12/05/2020   HDL 61 12/05/2020   LDLCALC 86 12/05/2020   TRIG 84 12/05/2020   CHOLHDL 2.7 12/05/2020    Hepatic Function Latest Ref Rng & Units 04/04/2021 03/06/2021 12/05/2020  Total Protein 6.5 - 8.1 g/dL 7.2 - 6.4  Albumin 3.5 - 5.0 g/dL 3.6 3.9 3.8  AST 15 - 41 U/L 21 - 15  ALT 0 - 44 U/L  11 - 6  Alk Phosphatase 38 - 126 U/L 150(H) - 191(H)  Total Bilirubin 0.3 - 1.2 mg/dL 1.0 - 0.4    Lab Results  Component Value Date/Time   TSH 3.730 12/05/2020 08:55 AM   TSH 2.170 08/18/2020 11:36 AM    CBC Latest Ref Rng & Units 04/18/2021 04/04/2021 03/09/2021  WBC 4.0 - 10.5 K/uL 7.9 7.7 6.9  Hemoglobin 12.0 - 15.0 g/dL  11.3(L) 11.9(L) 11.1(L)  Hematocrit 36.0 - 46.0 % 35.3(L) 36.4 34.4(L)  Platelets 150 - 400 K/uL 347 171 326    Lab Results  Component Value Date/Time   VD25OH 8.7 (L) 06/16/2019 03:51 PM    Clinical ASCVD: Yes  The 10-year ASCVD risk score Mikey Bussing DC Jr., et al., 2013) is: 12.1%   Values used to calculate the score:     Age: 89 years     Sex: Female     Is Non-Hispanic African American: Yes     Diabetic: Yes     Tobacco smoker: No     Systolic Blood Pressure: 99 mmHg     Is BP treated: Yes     HDL Cholesterol: 61 mg/dL     Total Cholesterol: 163 mg/dL    Depression screen [facility] 2/9 05/29/2021 04/26/2021 02/08/2021  Decreased Interest 0 0 0  Down, Depressed, Hopeless 2 1 0  PHQ - 2 Score 2 1 0  Altered sleeping 2 - -  Tired, decreased energy 3 - -  Change in appetite 3 - -  Feeling bad or failure about yourself  2 - -  Trouble concentrating 2 - -  Moving slowly or fidgety/restless 2 - -  Suicidal thoughts 0 - -  PHQ-9 Score 16 - -  Difficult doing work/chores Somewhat difficult - -  Some recent data might be hidden    Social History   Tobacco Use  Smoking Status Never  Smokeless Tobacco Never   BP Readings from Last 3 Encounters:  04/25/21 110/69  04/18/21 120/72  04/11/21 100/60   Pulse Readings from Last 3 Encounters:  04/25/21 85  04/18/21 76  04/04/21 (!) 58   Wt Readings from Last 3 Encounters:  04/25/21 129 lb (58.5 kg)  04/18/21 127 lb (57.6 kg)  04/11/21 130 lb (59 kg)    Assessment/Interventions: Review of patient past medical history, allergies, medications, health status, including review of consultants reports, laboratory and other test data, was performed as part of comprehensive evaluation and provision of chronic care management services.   SDOH:  (Social Determinants of Health) assessments and interventions performed: Yes    CCM Care Plan  Allergies  Allergen Reactions   Lotrel [Amlodipine Besy-Benazepril Hcl] Anaphylaxis   Contrast  Media [Iodinated Diagnostic Agents] Swelling and Other (See Comments)    Reaction:  Neck swelling    Niacin Hives   Sulfa Antibiotics Hives    Medications Reviewed Today     Reviewed by Julieta Bellini, CMA (Certified Medical Assistant) on 04/25/21 at 45  Med List Status: <None>   Medication Order Taking? Sig Documenting Provider Last Dose Status Informant  cephALEXin (KEFLEX) 250 MG/5ML suspension [id number] No Take 10 mLs (500 mg total) by mouth 3 (three) times daily.  Patient not taking: Reported on 04/25/2021   Johnn Hai, PA-C Not Taking Active   cyclobenzaprine (FLEXERIL) 5 MG tablet [id number] Yes TAKE 1 TABLET BY MOUTH 3 TIMES DAILY AS NEEDED. Jerrol Banana., MD Taking Active    Patient not taking:   Discontinued 05/15/20  1816   gabapentin (NEURONTIN) 100 MG capsule [id number] Yes TAKE 2 CAPSULES (200 MG TOTAL) BY MOUTH 3 (THREE) TIMES DAILY.  Patient taking differently: Take 200 mg by mouth 2 (two) times daily.   Jerrol Banana., MD Taking Active            Med Note Michaelle Birks, [facility] A   Mon Feb 27, 2021  1:31 PM)    hydrochlorothiazide (HYDRODIURIL) 25 MG tablet [id number] Yes Take 1 tablet (25 mg total) by mouth daily. Jerrol Banana., MD Taking Active   LINZESS 290 MCG CAPS capsule [id number] Yes TAKE 1 CAPSULE (290 MCG TOTAL) BY MOUTH DAILY BEFORE BREAKFAST. Jerrol Banana., MD Taking Active   metFORMIN (GLUCOPHAGE) 500 MG tablet [id number] Yes TAKE 1 TABLET (500 MG TOTAL) BY MOUTH 2 (TWO) TIMES DAILY WITH A MEAL.  Patient taking differently: Take 500 mg by mouth daily with breakfast.   Jerrol Banana., MD Taking Active            Med Note Michaelle Birks, [facility]   Mon Feb 27, 2021  1:30 PM)    methocarbamol (ROBAXIN) 500 MG tablet [id number] Yes Take 500 mg by mouth 2 (two) times daily. Back pain [provider] Taking Active Self  metoprolol succinate (TOPROL-XL) 25 MG 24 hr tablet [id number] Yes Take 1 tablet (25 mg total) by  mouth daily. Jerrol Banana., MD Taking Active   omeprazole (PRILOSEC) 20 MG capsule [id number] Yes TAKE 1 CAPSULE BY MOUTH EVERY DAY Jerrol Banana., MD Taking Active   ondansetron [facility]) 4 MG tablet [id number] Yes Take 1 tablet (4 mg total) by mouth every 8 (eight) hours as needed. P[place]on, [place] F, MD Taking Active   polyethylene glycol powder (GLYCOLAX/MIRALAX) 17 GM/SCOOP powder [id number] Yes Take 17 g by mouth daily as needed for moderate constipation. Harvest Dark, MD Taking Active   potassium chloride (KLOR-CON) 10 MEQ tablet [id number] Yes Take 1 tablet (10 mEq total) by mouth daily. Johnn Hai, PA-C Taking Active   Prasterone [facility]) 6.5 MG INST [id number] Yes Place 6.5 mg vaginally at bedtime. Gae Dry, MD Taking Active   predniSONE (DELTASONE) 10 MG tablet [id number] No Take 6 tablets  today, on day 2 take 5 tablets, day 3 take 4 tablets, day 4 take 3 tablets, day 5 take  2 tablets and 1 tablet the last day  Patient not taking: Reported on 04/25/2021   Johnn Hai, PA-C Not Taking Active   traMADol (ULTRAM) 50 MG tablet [id number] Yes Take 1 tablet (50 mg total) by mouth every 6 (six) hours as needed. Jerrol Banana., MD Taking Active   traZODone (DESYREL) 50 MG tablet [id number] Yes TAKE 1 TABLET (50 MG TOTAL) BY MOUTH AT BEDTIME AS NEEDED. Jerrol Banana., MD Taking Active   Vaginal Lubricant (REPLENS) GEL [id number] Yes Place 1 Applicatorful vaginally 3 (three) times a week. Gae Dry, MD Taking Active             Patient Active Problem List   Diagnosis Date Noted   Callus of foot 01/19/2021   Pes planus 01/19/2021   PTTD (posterior tibial tendon dysfunction) 01/19/2021   Acute flank pain 12/15/2020   Cystitis 12/15/2020   Urinary symptom or sign 12/15/2020   Low back pain radiating to right lower extremity 06/23/2020   Gastroesophageal reflux disease 04/28/2020   Large bowel obstruction ([place]) 04/17/2019   Acquired  trigger finger 03/20/2019  Iron deficiency anemia 11/21/2018   Syncope 03/18/2018   Anemia 03/18/2018   Exophthalmos 02/22/2018   Multiple thyroid nodules 02/22/2018   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   Pain at surgical site 08/30/2016   Ventral incisional hernia 08/21/2016   SBO (small bowel obstruction) (HCC)    Partial small bowel obstruction ([place]) 04/23/2016   Adjustment disorder with mixed anxiety and depressed mood 04/23/2016   Memory difficulties 09/01/2015   Arthritis 06/11/2015   Back pain, chronic 06/11/2015   HLD (hyperlipidemia) 06/11/2015   Cannot sleep 06/11/2015   L-S radiculopathy 06/11/2015   Arthritis of knee, degenerative 06/11/2015   B12 deficiency 06/11/2015   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   DM2 (diabetes mellitus, type 2) ([place]) 11/15/2012   Adiposity 11/21/2009   Avitaminosis D 11/07/2009   Narrowing of intervertebral disc space 07/25/2009   Benign essential HTN 07/25/2009    Immunization History  Administered Date(s) Administered   Fluad Quad(high Dose 65+) 08/15/2019, 09/01/2020   Influenza Split 10/17/2010, 10/02/2011, 08/27/2012   Influenza, High Dose Seasonal PF 08/29/2018   Influenza,inj,Quad PF,6+ Mos 09/26/2013, 09/06/2014, 09/06/2016   Influenza,inj,Quad PF,6-35 Mos 08/31/2017   Influenza-Unspecified 09/15/2015   PFIZER(Purple Top)SARS-COV-2 Vaccination 01/29/2020, 02/23/2020, 10/11/2020   Pneumococcal Conjugate-13 08/29/2018   Pneumococcal Polysaccharide-23 11/17/2012   Tdap 10/02/2011, 09/27/2016   Zoster Recombinat (Shingrix) 08/29/2018, 08/15/2019   Zoster, Live 09/06/2016    Conditions to be addressed/monitored:  Hypertension, Hyperlipidemia, Diabetes, GERD and Chronic Pain  There are no care plans that you recently modified to display for this patient.    Medication Assistance: None required.  Patient affirms current coverage meets needs.  Patient's  preferred pharmacy is:  [id number] [place]Phone: [phone]: [phone] Uses pill box? Yes Pt endorses 100% compliance  We discussed: Current pharmacy is preferred with insurance plan and patient is satisfied with pharmacy services Patient decided to: Continue current medication management strategy  Care Plan and Follow Up Patient Decision:  Patient agrees to Care Plan and Follow-up.  Plan: Telephone follow up appointment with care management team member scheduled for:  06/16/2021 at 11:00 AM  [phone]

## 2021-05-30 ENCOUNTER — Ambulatory Visit: Payer: Medicare HMO | Admitting: Family Medicine

## 2021-05-31 ENCOUNTER — Other Ambulatory Visit: Payer: Self-pay

## 2021-05-31 NOTE — Patient Outreach (Signed)
Aging Gracefully Program  05/31/2021  Julie Acevedo Sep 06, 1951 [id number]  Placed call to patient who answered. Explained purpose of call. Offered home visit for 6/16 at 10 am and patient has accepted appointment time and date for she and her husband. Confirmed address.  [name], RN, BSN, CEN [facility] [employer] [phone]

## 2021-06-01 ENCOUNTER — Other Ambulatory Visit: Payer: Self-pay

## 2021-06-01 NOTE — Patient Instructions (Signed)
Goals Addressed               This Visit's Progress     Patient Stated (pt-stated)        Aging Gracefully Rn:   Patient states she would like to have decreased pain in back.  Assessment:  06/01/2021   Noted patient in severe back pain when sitting a walking.  Observed patient taking medications for pain. Observed patient having to stop walking until spasms stop.   Intervention:  reviewed with patient the importance of taking all medications as prescribed. Reviewed importance of talking to MD about other pain control options.   Plan: next home visit on 06/28/2021       Patient Stated        Aging Gracefully RN Goal  Patient states that she is worried about continued failing memory.  Assessment: Per patient new diagnosis of dementia. Reviewed concerns. Patient tearful of thought that her kids and grandkids will have to take care of her. Worries she will be a burden.   Interventions:  Encouraged patient to write things down. Encouraged patient to discuss her wishes with her husband and children. Reviewed with patient about finding a support group and learning of other ways to help with memory.  Reviewed pending follow up with MD.  PLAN: next home visit planned for 06/28/2021.  [name], RN, BSN, CEN [facility] [employer] [phone]

## 2021-06-01 NOTE — Patient Outreach (Signed)
Aging Gracefully Program  RN Visit  06/01/2021  Julie Acevedo January 25, 1951 [id number]  Visit:   RN initial home visit  Start Time:   1000 End Time:   1130 Total Minutes:   90  Readiness To Change Score:     Universal RN Interventions: Calendar Distribution: Yes Exercise Review: No Medications: Yes Medication Changes: No Mood: Yes Pain: Yes PCP Advocacy/Support: No Fall Prevention: Yes Incontinence: No Clinician View Of Client Situation: Covid screening negative. Home neat and clean. Hot 80 degree. ceiling fan running.   Patient is obvious pain with back. Noted that with movement she has spasms.  Noted when walking she has to stop due to pain and spasms.  Patient is tearful when talking about her new diagnosis of dementia.  Worry about what is to come. Noted popcorn falling from ceiling in kitchen and dinning area. Noted bathtub with cracks and plaster coming off. Patient appears to really enjoy taking a bath and soaking her back in the tub. Patient reports she enjoys her flowers. 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  Talks of her children and grandchildren. Client View Of His/Her Situation: Lives in pain daily, chronic back pain and spasms.   New diagnosis of dementia. Reports her  memory is bad.  Reports she has a had time with knowing she can not remember.  Healthcare Provider Communication: Did Higher education careers adviser With [employer] Provider?: No According to Client, Did PCP Report Communication With An Aging Gracefully RN?: No  Clinician View of Client Situation: Clinician View Of Client Situation: Covid screening negative. Home neat and clean. Hot 80 degree. ceiling fan running.   Patient is obvious pain with back. Noted that with movement she has spasms.  Noted when walking she has to stop due to pain and spasms.  Patient is tearful when talking about her new diagnosis of dementia.  Worry about what is to come. Noted popcorn falling from ceiling in kitchen and  dinning area. Noted bathtub with cracks and plaster coming off. Patient appears to really enjoy taking a bath and soaking her back in the tub. Patient reports she enjoys her flowers. 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  Talks of her children and grandchildren. Client's View of His/Her Situation: Client View Of His/Her Situation: Lives in pain daily, chronic back pain and spasms.   New diagnosis of dementia. Reports her  memory is bad.  Reports she has a had time with knowing she can not remember.  Medication Assessment: Do You Have Any Problems Paying For Medications?: No Where Does Client Store Medications?: Kitchen Table Can Client Read Pill Bottles?: No Does Client Use A Pillbox?: Yes Does Anyone Assist Client In Filling Pillbox?: No Does Anyone Assist Client In Taking Medications?: No Do You Take Vitamin D?: No Total Number Of Medications That The Client Takes: 10 Does Client Have Any Questions Or Concerns About Medictions?: No Is Client Complaining Of Any Symptoms That Could Be Side Effects To Medications?: No Any Possible Changes In Medication Regimen?: No  Outpatient Encounter Medications as of 06/01/2021  Medication Sig   cyclobenzaprine (FLEXERIL) 5 MG tablet TAKE 1 TABLET BY MOUTH THREE TIMES A DAY AS NEEDED   gabapentin (NEURONTIN) 100 MG capsule TAKE 2 CAPSULES (200 MG TOTAL) BY MOUTH 3 (THREE) TIMES DAILY. (Patient taking differently: Take 200 mg by mouth 2 (two) times daily.)   hydrochlorothiazide (HYDRODIURIL) 25 MG tablet Take 1 tablet (25 mg total) by mouth daily. (  Patient taking differently: Take 12.5 mg by mouth daily.)   LINZESS 290 MCG CAPS capsule TAKE 1 CAPSULE (290 MCG TOTAL) BY MOUTH DAILY BEFORE BREAKFAST.   metFORMIN (GLUCOPHAGE) 500 MG tablet TAKE 1 TABLET (500 MG TOTAL) BY MOUTH 2 (TWO) TIMES DAILY WITH A MEAL. (Patient taking differently: Take 500 mg by mouth daily with breakfast.)   methocarbamol (ROBAXIN) 500 MG tablet Take 500 mg by mouth 2  (two) times daily. Back pain   metoprolol succinate (TOPROL-XL) 25 MG 24 hr tablet Take 1 tablet (25 mg total) by mouth daily.   omeprazole (PRILOSEC) 20 MG capsule TAKE 1 CAPSULE BY MOUTH EVERY DAY   ondansetron (ZOFRAN) 4 MG tablet Take 1 tablet (4 mg total) by mouth every 8 (eight) hours as needed.   polyethylene glycol powder (GLYCOLAX/MIRALAX) 17 GM/SCOOP powder Take 17 g by mouth daily as needed for moderate constipation.   potassium chloride (KLOR-CON) 10 MEQ tablet Take 1 tablet (10 mEq total) by mouth daily.   traMADol (ULTRAM) 50 MG tablet Take 1 tablet (50 mg total) by mouth every 6 (six) hours as needed.   traZODone (DESYREL) 50 MG tablet TAKE 1 TABLET (50 MG TOTAL) BY MOUTH AT BEDTIME AS NEEDED.   Vaginal Lubricant (REPLENS) GEL Place 1 Applicatorful vaginally 3 (three) times a week.   cephALEXin (KEFLEX) 250 MG/5ML suspension Take 10 mLs (500 mg total) by mouth 3 (three) times daily.   Prasterone (INTRAROSA) 6.5 MG INST Place 6.5 mg vaginally at bedtime. (Patient not taking: Reported on 06/01/2021)   predniSONE (DELTASONE) 10 MG tablet Take 6 tablets  today, on day 2 take 5 tablets, day 3 take 4 tablets, day 4 take 3 tablets, day 5 take  2 tablets and 1 tablet the last day (Patient not taking: No sig reported)   [DISCONTINUED] diphenhydrAMINE (BENADRYL) 50 MG tablet Patient is to take one tablet 1 hour before test. (Patient not taking: Reported on 04/28/2020)   No facility-administered encounter medications on file as of 06/01/2021.     OT Update: Pending community housing assessment.  Session Summary: Very sweet 70 year old lady who has chronic back pain and spasms.  Worried abut new onset of dementia.    Goals Addressed               This Visit's Progress     Patient Stated (pt-stated)        Aging Gracefully Rn:   Patient states she would like to have decreased pain in back.  Assessment:  06/01/2021   Noted patient in severe back pain when sitting a walking.  Observed  patient taking medications for pain. Observed patient having to stop walking until spasms stop.   Intervention:  reviewed with patient the importance of taking all medications as prescribed. Reviewed importance of talking to MD about other pain control options.   Plan: next home visit on 06/28/2021       Patient Stated        Aging Gracefully RN Goal  Patient states that she is worried about continued failing memory.  Assessment: Per patient new diagnosis of dementia. Reviewed concerns. Patient tearful of thought that her kids and grandkids will have to take care of her. Worries she will be a burden.   Interventions:  Encouraged patient to write things down. Encouraged patient to discuss her wishes with her husband and children. Reviewed with patient about finding a support group and learning of other ways to help with memory.  Reviewed pending follow  up with MD.  PLAN: next home visit planned for 06/28/2021.  [name], RN, BSN, CEN [facility] [employer] [phone]

## 2021-06-02 ENCOUNTER — Inpatient Hospital Stay (HOSPITAL_BASED_OUTPATIENT_CLINIC_OR_DEPARTMENT_OTHER): Payer: Medicare HMO | Admitting: Oncology

## 2021-06-02 ENCOUNTER — Encounter: Payer: Self-pay | Admitting: Oncology

## 2021-06-02 ENCOUNTER — Inpatient Hospital Stay: Payer: Medicare HMO

## 2021-06-02 ENCOUNTER — Inpatient Hospital Stay: Payer: Medicare HMO | Attending: Oncology

## 2021-06-02 VITALS — BP 110/71 | HR 66 | Temp 98.1°F | Resp 20 | Wt 131.1 lb

## 2021-06-02 VITALS — BP 109/64 | HR 63 | Resp 18

## 2021-06-02 DIAGNOSIS — D509 Iron deficiency anemia, unspecified: Secondary | ICD-10-CM | POA: Insufficient documentation

## 2021-06-02 LAB — CBC WITH DIFFERENTIAL/PLATELET
Abs Immature Granulocytes: 0.01 10*3/uL (ref 0.00–0.07)
Basophils Absolute: 0 10*3/uL (ref 0.0–0.1)
Basophils Relative: 0 %
Eosinophils Absolute: 0.1 10*3/uL (ref 0.0–0.5)
Eosinophils Relative: 2 %
HCT: 34.4 % — ABNORMAL LOW (ref 36.0–46.0)
Hemoglobin: 11.1 g/dL — ABNORMAL LOW (ref 12.0–15.0)
Immature Granulocytes: 0 %
Lymphocytes Relative: 27 %
Lymphs Abs: 1.6 10*3/uL (ref 0.7–4.0)
MCH: 29.3 pg (ref 26.0–34.0)
MCHC: 32.3 g/dL (ref 30.0–36.0)
MCV: 90.8 fL (ref 80.0–100.0)
Monocytes Absolute: 0.5 10*3/uL (ref 0.1–1.0)
Monocytes Relative: 8 %
Neutro Abs: 3.9 10*3/uL (ref 1.7–7.7)
Neutrophils Relative %: 63 %
Platelets: 277 10*3/uL (ref 150–400)
RBC: 3.79 MIL/uL — ABNORMAL LOW (ref 3.87–5.11)
RDW: 13 % (ref 11.5–15.5)
WBC: 6.2 10*3/uL (ref 4.0–10.5)
nRBC: 0 % (ref 0.0–0.2)

## 2021-06-02 LAB — COMPREHENSIVE METABOLIC PANEL
ALT: 9 U/L (ref 0–44)
AST: 16 U/L (ref 15–41)
Albumin: 3.5 g/dL (ref 3.5–5.0)
Alkaline Phosphatase: 179 U/L — ABNORMAL HIGH (ref 38–126)
Anion gap: 8 (ref 5–15)
BUN: 19 mg/dL (ref 8–23)
CO2: 27 mmol/L (ref 22–32)
Calcium: 9.1 mg/dL (ref 8.9–10.3)
Chloride: 107 mmol/L (ref 98–111)
Creatinine, Ser: 1.41 mg/dL — ABNORMAL HIGH (ref 0.44–1.00)
GFR, Estimated: 41 mL/min — ABNORMAL LOW (ref >60)
Glucose, Bld: 73 mg/dL (ref 70–99)
Potassium: 3.3 mmol/L — ABNORMAL LOW (ref 3.5–5.1)
Sodium: 142 mmol/L (ref 135–145)
Total Bilirubin: 0.5 mg/dL (ref 0.3–1.2)
Total Protein: 6.7 g/dL (ref 6.5–8.1)

## 2021-06-02 LAB — IRON AND TIBC
Iron: 75 ug/dL (ref 28–170)
Saturation Ratios: 34 % — ABNORMAL HIGH (ref 10.4–31.8)
TIBC: 218 ug/dL — ABNORMAL LOW (ref 250–450)
UIBC: 143 ug/dL

## 2021-06-02 LAB — VITAMIN B12: Vitamin B-12: 395 pg/mL (ref 180–914)

## 2021-06-02 LAB — FERRITIN: Ferritin: 299 ng/mL (ref 11–307)

## 2021-06-02 MED ORDER — SODIUM CHLORIDE 0.9 % IV SOLN
Freq: Once | INTRAVENOUS | Status: AC
Start: 2021-06-02 — End: 2021-06-02
  Filled 2021-06-02: qty 250

## 2021-06-02 MED ORDER — SODIUM CHLORIDE 0.9 % IV SOLN: 200.0000 mg | Freq: Once | INTRAVENOUS | Status: DC

## 2021-06-02 MED ORDER — IRON SUCROSE 20 MG/ML IV SOLN
200.0000 mg | Freq: Once | INTRAVENOUS | Status: AC
  Administered 2021-06-02: 200 mg via INTRAVENOUS
  Filled 2021-06-02: qty 10

## 2021-06-02 NOTE — Patient Instructions (Signed)
[facility] [place] [facility]   Discharge Instructions: Thank you for choosing [facility] to provide your oncology and hematology care.  If you have a lab appointment with the Cancer Center, please go directly to the Cancer Center and check in at the registration area.  We strive to give you quality time with your provider. You may need to reschedule your appointment if you arrive late (15 or more minutes).  Arriving late affects you and other patients whose appointments are after yours.  Also, if you miss three or more appointments without notifying the office, you may be dismissed from the clinic at the provider's discretion.      For prescription refill requests, have your pharmacy contact our office and allow 72 hours for refills to be completed.    Today you received the following: Venofer.      BELOW ARE SYMPTOMS THAT SHOULD BE REPORTED IMMEDIATELY: *FEVER GREATER THAN 100.4 F (38 C) OR HIGHER *CHILLS OR SWEATING *NAUSEA AND VOMITING THAT IS NOT CONTROLLED WITH YOUR NAUSEA MEDICATION *UNUSUAL SHORTNESS OF BREATH *UNUSUAL BRUISING OR BLEEDING *URINARY PROBLEMS (pain or burning when urinating, or frequent urination) *BOWEL PROBLEMS (unusual diarrhea, constipation, pain near the anus) TENDERNESS IN MOUTH AND THROAT WITH OR WITHOUT PRESENCE OF ULCERS (sore throat, sores in mouth, or a toothache) UNUSUAL RASH, SWELLING OR PAIN  UNUSUAL VAGINAL DISCHARGE OR ITCHING   Items with * indicate a potential emergency and should be followed up as soon as possible or go to the Emergency Department if any problems should occur.  Should you have questions after your visit or need to cancel or reschedule your appointment, please contact [facility] [place] [facility]  [phone] and follow the prompts.  Office hours are 8:00 a.m. to 4:30 p.m. Monday - Friday. Please note that voicemails left after 4:00 p.m. may not be returned until the following  business day.  We are closed weekends and major holidays. You have access to a nurse at all times for urgent questions. Please call the main number to the clinic [phone] and follow the prompts.  For any non-urgent questions, you may also contact your provider using MyChart. We now offer e-Visits for anyone 18 and older to request care online for non-urgent symptoms. For details visit [email or web address].   Also download the MyChart app! Go to the app store, search "MyChart", open the app, select [place], and log in with your MyChart username and password.  Due to Covid, a mask is required upon entering the hospital/clinic. If you do not have a mask, one will be given to you upon arrival. For doctor visits, patients may have 1 support person aged 18 or older with them. For treatment visits, patients cannot have anyone with them due to current Covid guidelines and our immunocompromised population.  

## 2021-06-02 NOTE — Progress Notes (Signed)
Patient denies any concerns today.  

## 2021-06-02 NOTE — Progress Notes (Signed)
[facility]  [phone] [phone]  ID: Julie Acevedo OB: 03-04-1951  MR#: [id number]  YTK#:[id number]  Patient Care Team: Jerrol Banana., MD as PCP - General (Family Medicine) Yolonda Kida, MD as PCP - Cardiology (Cardiology) Lorelee Cover., MD as Consulting Physician (Ophthalmology) Bjorn Loser, MD as Consulting Physician (Urology) Bary Castilla, Forest Gleason, MD as Consulting Physician (General Surgery) Pabon, Marjory Lies, MD as Consulting Physician (General Surgery) Gardiner Barefoot, DPM as Consulting Physician (Podiatry) Gae Dry, MD as Referring Physician (Obstetrics and Gynecology) Vladimir Crofts, MD as Consulting Physician (Neurology) Randon Goldsmith, OT as Occupational Therapist (Occupational Therapy) Thana Ates, RN as [facility]   CHIEF COMPLAINT: Iron deficiency anemia.  INTERVAL HISTORY: Patient returns to clinic today for repeat laboratory work, further evaluation, and consideration of additional IV Feraheme.  She was last seen in clinic on 03/10/2021.  She last received IV Feraheme on 02/10/2020 and IV Venofer on 03/14/2021 and 03/16/2021.  In the interim, she has done well.  Reports improved energy after last IV iron although it only lasted approximately 1 month.  Denies any episodes of bleeding and no additional concerns at this time.   REVIEW OF SYSTEMS:   Review of Systems  Constitutional:  Positive for malaise/fatigue. Negative for fever and weight loss.  Respiratory: Negative.  Negative for cough and shortness of breath.   Cardiovascular: Negative.  Negative for chest pain and leg swelling.  Gastrointestinal:  Negative for abdominal pain, blood in stool and melena.  Genitourinary: Negative.  Negative for hematuria.  Musculoskeletal: Negative.  Negative for back pain.  Skin: Negative.  Negative for rash.  Neurological:  Negative for dizziness, focal weakness, weakness and  headaches.  Psychiatric/Behavioral: Negative.  The patient is not nervous/anxious.    As per HPI. Otherwise, a complete review of systems is negative.  PAST MEDICAL HISTORY: Past Medical History:  Diagnosis Date   Anemia    Arthritis    COVID-19 11/19/2019   Diverticulitis    DM (diabetes mellitus) ([facility])    typ e 2   History of methicillin resistant staphylococcus aureus (MRSA) 2017   HTN (hypertension)    Hyperlipidemia    Memory difficulties 09/01/2015   Multiple thyroid nodules    Myocardial infarction ([place]) 1995   Short-term memory loss     PAST SURGICAL HISTORY: Past Surgical History:  Procedure Laterality Date   ABDOMINAL HYSTERECTOMY     due to endometriosis-1 ovary left   BREAST EXCISIONAL BIOPSY Right 1971   neg   CARDIAC CATHETERIZATION  2000   no significant CAD per note of Dr Caryl Comes in 2013    [place] N/A 03/19/2017   Procedure: ANTERIOR REPAIR (CYSTOCELE);  Surgeon: Bjorn Loser, MD;  Location: [facility];  Service: Urology;  Laterality: N/A;   CYSTOSCOPY N/A 03/19/2017   Procedure: CYSTOSCOPY;  Surgeon: Bjorn Loser, MD;  Location: [facility];  Service: Urology;  Laterality: N/A;   EYE SURGERY     FOOT SURGERY     HAND SURGERY     HEMICOLECTOMY     INSERTION OF MESH N/A 08/21/2016   Procedure: INSERTION OF MESH;  Surgeon: Excell Seltzer, MD;  Location: [facility];  Service: General;  Laterality: N/A;   INSERTION OF MESH N/A 12/08/2019   Procedure: INSERTION OF MESH;  Surgeon: Jules Husbands, MD;  Location: [facility];  Service: General;  Laterality: N/A;   LAPAROSCOPIC LYSIS OF ADHESIONS  N/A 08/21/2016   Procedure: LAPAROSCOPIC LYSIS OF ADHESIONS;  Surgeon: Excell Seltzer, MD;  Location: [facility];  Service: General;  Laterality: N/A;   LAPAROSCOPIC REMOVAL ABDOMINAL MASS     LAPAROTOMY N/A 04/16/2019   Procedure: EXPLORATORY LAPAROTOMY;  Surgeon: Benjamine Sprague, DO;  Location: [facility];  Service: General;  Laterality: N/A;   LOOP  RECORDER INSERTION N/A 03/21/2018   Procedure: LOOP RECORDER INSERTION;  Surgeon: Sanda Klein, MD;  Location: [facility];  Service: Cardiovascular;  Laterality: N/A;   LUMBAR DISC SURGERY     ROUX-EN-Y GASTRIC BYPASS  2010   Dr Hassell Done   SMALL INTESTINE SURGERY     TONSILLECTOMY     UPPER GI ENDOSCOPY  01/12/2016   normal larynx, normal esophagus. gastric bypass with a normal-sized pouch and intact staple line, normal examined jejunum, otherwise exam was normal   VENTRAL HERNIA REPAIR N/A 08/21/2016   Procedure: LAPAROSCOPIC VENTRAL HERNIA;  Surgeon: Excell Seltzer, MD;  Location: [facility];  Service: General;  Laterality: N/A;   XI ROBOTIC ASSISTED VENTRAL HERNIA N/A 12/08/2019   Procedure: XI ROBOTIC ASSISTED VENTRAL HERNIA;  Surgeon: Jules Husbands, MD;  Location: [facility];  Service: General;  Laterality: N/A;    FAMILY HISTORY: Family History  Problem Relation Age of Onset   Cancer Father 83       Lung   Coronary artery disease Father 79   COPD Mother    Diabetes Mother    Hypertension Mother    Cancer Paternal Grandmother        Breast   Breast cancer Paternal Grandmother    Congestive Heart Failure Maternal Grandmother    Emphysema Maternal Grandfather    Heart attack Paternal Grandfather    Cancer Paternal Aunt        Breast   Breast cancer Paternal Aunt 31   Breast cancer Paternal Aunt 1    ADVANCED DIRECTIVES (Y/N):  N  HEALTH MAINTENANCE: Social History   Tobacco Use   Smoking status: Never   Smokeless tobacco: Never  Vaping Use   Vaping Use: Never used  Substance Use Topics   Alcohol use: No   Drug use: No     Colonoscopy:  PAP:  Bone density:  Lipid panel:  Allergies  Allergen Reactions   Lotrel [Amlodipine Besy-Benazepril Hcl] Anaphylaxis   Contrast Media [Iodinated Diagnostic Agents] Swelling and Other (See Comments)    Reaction:  Neck swelling    Niacin Hives   Sulfa Antibiotics Hives    Current Outpatient Medications   Medication Sig Dispense Refill   cyclobenzaprine (FLEXERIL) 5 MG tablet TAKE 1 TABLET BY MOUTH THREE TIMES A DAY AS NEEDED 60 tablet 3   gabapentin (NEURONTIN) 100 MG capsule TAKE 2 CAPSULES (200 MG TOTAL) BY MOUTH 3 (THREE) TIMES DAILY. (Patient taking differently: Take 200 mg by mouth 2 (two) times daily.) 540 capsule 1   hydrochlorothiazide (HYDRODIURIL) 25 MG tablet Take 1 tablet (25 mg total) by mouth daily. (Patient taking differently: Take 12.5 mg by mouth daily.) 90 tablet 1   LINZESS 290 MCG CAPS capsule TAKE 1 CAPSULE (290 MCG TOTAL) BY MOUTH DAILY BEFORE BREAKFAST. 90 capsule 0   metFORMIN (GLUCOPHAGE) 500 MG tablet TAKE 1 TABLET (500 MG TOTAL) BY MOUTH 2 (TWO) TIMES DAILY WITH A MEAL. (Patient taking differently: Take 500 mg by mouth daily with breakfast.) 180 tablet 3   methocarbamol (ROBAXIN) 500 MG tablet Take 500 mg by mouth 2 (two) times daily. Back pain  metoprolol succinate (TOPROL-XL) 25 MG 24 hr tablet Take 1 tablet (25 mg total) by mouth daily. 90 tablet 1   omeprazole (PRILOSEC) 20 MG capsule TAKE 1 CAPSULE BY MOUTH EVERY DAY 90 capsule 1   ondansetron (ZOFRAN) 4 MG tablet Take 1 tablet (4 mg total) by mouth every 8 (eight) hours as needed. 20 tablet 0   polyethylene glycol powder (GLYCOLAX/MIRALAX) 17 GM/SCOOP powder Take 17 g by mouth daily as needed for moderate constipation. 255 g 0   potassium chloride (KLOR-CON) 10 MEQ tablet Take 1 tablet (10 mEq total) by mouth daily. 7 tablet 0   traMADol (ULTRAM) 50 MG tablet Take 1 tablet (50 mg total) by mouth every 6 (six) hours as needed. 100 tablet 2   traZODone (DESYREL) 50 MG tablet TAKE 1 TABLET (50 MG TOTAL) BY MOUTH AT BEDTIME AS NEEDED. 30 tablet 11   Vaginal Lubricant (REPLENS) GEL Place 1 Applicatorful vaginally 3 (three) times a week. 35 g 11   Prasterone (INTRAROSA) 6.5 MG INST Place 6.5 mg vaginally at bedtime. (Patient not taking: No sig reported) 30 each 11   No current facility-administered medications for  this visit.    OBJECTIVE: Vitals:   06/02/21 1121  BP: 110/71  Pulse: 66  Resp: 20  Temp: 98.1 F (36.7 C)     Body mass index is 23.22 kg/m.    ECOG FS:0 - Asymptomatic  Physical Exam Constitutional:      Appearance: Normal appearance.  HENT:     Head: Normocephalic and atraumatic.  Eyes:     Pupils: Pupils are equal, round, and reactive to light.  Cardiovascular:     Rate and Rhythm: Normal rate and regular rhythm.     Heart sounds: Normal heart sounds. No murmur heard. Pulmonary:     Effort: Pulmonary effort is normal.     Breath sounds: Normal breath sounds. No wheezing.  Abdominal:     General: Bowel sounds are normal. There is no distension.     Palpations: Abdomen is soft.     Tenderness: There is no abdominal tenderness.  Musculoskeletal:        General: Normal range of motion.     Cervical back: Normal range of motion.  Skin:    General: Skin is warm and dry.     Findings: No rash.  Neurological:     Mental Status: She is alert and oriented to person, place, and time.  Psychiatric:        Judgment: Judgment normal.    LAB RESULTS:  Lab Results  Component Value Date   NA 142 06/02/2021   K 3.3 (L) 06/02/2021   CL 107 06/02/2021   CO2 27 06/02/2021   GLUCOSE 73 06/02/2021   BUN 19 06/02/2021   CREATININE 1.41 (H) 06/02/2021   CALCIUM 9.1 06/02/2021   PROT 6.7 06/02/2021   ALBUMIN 3.5 06/02/2021   AST 16 06/02/2021   ALT 9 06/02/2021   ALKPHOS 179 (H) 06/02/2021   BILITOT 0.5 06/02/2021   GFRNONAA 41 (L) 06/02/2021   GFRAA 42 (L) 12/05/2020    Lab Results  Component Value Date   WBC 6.2 06/02/2021   NEUTROABS 3.9 06/02/2021   HGB 11.1 (L) 06/02/2021   HCT 34.4 (L) 06/02/2021   MCV 90.8 06/02/2021   PLT 277 06/02/2021    Lab Results  Component Value Date   IRON 53 03/09/2021   TIBC 221 (L) 03/09/2021   IRONPCTSAT 24 03/09/2021   Lab Results  Component Value  Date   FERRITIN 221 03/09/2021    STUDIES: US BREAST LTD UNI LEFT INC  AXILLA  Result Date: 05/17/2021 CLINICAL DATA:  Newly appreciated palpable area since Sunday EXAM: DIGITAL DIAGNOSTIC UNILATERAL LEFT MAMMOGRAM WITH TOMOSYNTHESIS AND CAD; ULTRASOUND LEFT BREAST LIMITED TECHNIQUE: Left digital diagnostic mammography and breast tomosynthesis was performed. The images were evaluated with computer-aided detection.; Targeted ultrasound examination of the left breast was performed COMPARISON:  Previous exam(s). ACR Breast Density Category c: The breast tissue is heterogeneously dense, which may obscure small masses. FINDINGS: Spot compression tomosynthesis views were obtained of the site of palpable concern. Patient's cardiac loop recorder is near the site of palpable concern on diagnostic images. No suspicious mass, distortion, or microcalcifications are identified to suggest presence of malignancy. On physical exam, there is a firm area in the LEFT upper inner breast at the site of palpable concern. Targeted ultrasound was performed of the site of palpable concern. There is an echogenic linear focus with posterior acoustic shadowing consistent with patient's cardiac loop recorder at the site of palpable concern. No suspicious cystic or solid mass is seen in the region. IMPRESSION: 1. Patient's cardiac loop recorder is at the site of palpable concern. No mammographic or sonographic evidence of malignancy. RECOMMENDATION: Recommend return to annual screening mammography, due December 2022 I have discussed the findings and recommendations with the patient. If applicable, a reminder letter will be sent to the patient regarding the next appointment. BI-RADS CATEGORY  2: Benign. Electronically Signed   By: Valentino Saxon MD   On: 05/17/2021 10:35   MM DIAG BREAST TOMO UNI LEFT  Result Date: 05/17/2021 CLINICAL DATA:  Newly appreciated palpable area since Sunday EXAM: DIGITAL DIAGNOSTIC UNILATERAL LEFT MAMMOGRAM WITH TOMOSYNTHESIS AND CAD; ULTRASOUND LEFT BREAST LIMITED TECHNIQUE:  Left digital diagnostic mammography and breast tomosynthesis was performed. The images were evaluated with computer-aided detection.; Targeted ultrasound examination of the left breast was performed COMPARISON:  Previous exam(s). ACR Breast Density Category c: The breast tissue is heterogeneously dense, which may obscure small masses. FINDINGS: Spot compression tomosynthesis views were obtained of the site of palpable concern. Patient's cardiac loop recorder is near the site of palpable concern on diagnostic images. No suspicious mass, distortion, or microcalcifications are identified to suggest presence of malignancy. On physical exam, there is a firm area in the LEFT upper inner breast at the site of palpable concern. Targeted ultrasound was performed of the site of palpable concern. There is an echogenic linear focus with posterior acoustic shadowing consistent with patient's cardiac loop recorder at the site of palpable concern. No suspicious cystic or solid mass is seen in the region. IMPRESSION: 1. Patient's cardiac loop recorder is at the site of palpable concern. No mammographic or sonographic evidence of malignancy. RECOMMENDATION: Recommend return to annual screening mammography, due December 2022 I have discussed the findings and recommendations with the patient. If applicable, a reminder letter will be sent to the patient regarding the next appointment. BI-RADS CATEGORY  2: Benign. Electronically Signed   By: Valentino Saxon MD   On: 05/17/2021 10:35   CUP PACEART REMOTE DEVICE CHECK  Result Date: 05/24/2021 ILR summary report received. Battery status OK. Normal device function. No new symptom, tachy, brady, or pause episodes. No new AF episodes. Monthly summary reports and ROV/PRN. HB    ASSESSMENT: Iron deficiency anemia.  PLAN:   1.  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  Hemoglobin between  8 and 9.  Had  colonoscopy in September 2019 which was reported as normal. Initial work-up included hemolysis labs, B12, folate and SPEP which were negative or within normal limits. Hemoglobin has been stable between 10-11 for quite some time now. She last received IV Feraheme in March 2021 and IV Venofer on 03/14/2021 and 03/16/2021. Labs from 06/02/2021 show a hemoglobin of 11.1 which is fairly stable.  Her iron levels are pending. Proceed with 1 dose of IV Venofer today. RTC in 3 months repeat labs, NP and possible IV Venofer.  Disposition: IV Venofer x1 dose. RTC in 3 months for repeat labs, NP and possible IV Venofer.  Greater than 50% was spent in counseling and coordination of care with this patient including but not limited to discussion of the relevant topics above (See A&P) including, but not limited to diagnosis and management of acute and chronic medical conditions.   Faythe Casa, NP 06/07/2021 5:37 AM

## 2021-06-07 ENCOUNTER — Encounter: Payer: Self-pay | Admitting: Oncology

## 2021-06-09 ENCOUNTER — Other Ambulatory Visit: Payer: Self-pay | Admitting: Family Medicine

## 2021-06-09 DIAGNOSIS — K219 Gastro-esophageal reflux disease without esophagitis: Secondary | ICD-10-CM

## 2021-06-09 NOTE — Progress Notes (Signed)
[facility] Summary Report / Loop Recorder 

## 2021-06-14 DIAGNOSIS — R4189 Other symptoms and signs involving cognitive functions and awareness: Secondary | ICD-10-CM | POA: Diagnosis not present

## 2021-06-14 DIAGNOSIS — Z8639 Personal history of other endocrine, nutritional and metabolic disease: Secondary | ICD-10-CM | POA: Diagnosis not present

## 2021-06-14 DIAGNOSIS — R569 Unspecified convulsions: Secondary | ICD-10-CM | POA: Diagnosis not present

## 2021-06-15 ENCOUNTER — Telehealth: Payer: Self-pay

## 2021-06-15 NOTE — Progress Notes (Signed)
Spoke to patient to confirmed patient telephone appointment on 06/16/2021 for CCM at 11:00 am with [name] the Clinical pharmacist.   [facility] Pharmacist Assistant [phone]

## 2021-06-16 ENCOUNTER — Ambulatory Visit (INDEPENDENT_AMBULATORY_CARE_PROVIDER_SITE_OTHER): Payer: Medicare HMO

## 2021-06-16 DIAGNOSIS — E1159 Type 2 diabetes mellitus with other circulatory complications: Secondary | ICD-10-CM

## 2021-06-16 DIAGNOSIS — E1122 Type 2 diabetes mellitus with diabetic chronic kidney disease: Secondary | ICD-10-CM

## 2021-06-16 DIAGNOSIS — N1832 Chronic kidney disease, stage 3b: Secondary | ICD-10-CM | POA: Diagnosis not present

## 2021-06-16 DIAGNOSIS — I152 Hypertension secondary to endocrine disorders: Secondary | ICD-10-CM

## 2021-06-16 NOTE — Progress Notes (Signed)
Chronic Care Management Pharmacy Note  06/16/2021 Name:  Julie Acevedo MRN:  [id number] DOB:  1951-11-25  Summary: Patient reports she is doing well overall, she does continue to have postural dizziness. She is Worried about results of MRI scan.   Recommendations/Changes made from today's visit: Continue Current Medications  Plan: CPP follow-up in 2 weeks  Subjective: Julie Acevedo is an 70 y.o. year old female who is a primary patient of Jerrol Banana., MD.  The CCM team was consulted for assistance with disease management and care coordination needs.    Engaged with patient by telephone for follow up visit in response to provider referral for pharmacy case management and/or care coordination services.   Consent to Services:  The patient was given information about Chronic Care Management services, agreed to services, and gave verbal consent prior to initiation of services.  Please see initial visit note for detailed documentation.   Patient Care Team: Jerrol Banana., MD as PCP - General (Family Medicine) Yolonda Kida, MD as PCP - Cardiology (Cardiology) Lorelee Cover., MD as Consulting Physician (Ophthalmology) Bjorn Loser, MD as Consulting Physician (Urology) Bary Castilla, Forest Gleason, MD as Consulting Physician (General Surgery) Pabon, Marjory Lies, MD as Consulting Physician (General Surgery) Gardiner Barefoot, DPM as Consulting Physician (Podiatry) Gae Dry, MD as Referring Physician (Obstetrics and Gynecology) Vladimir Crofts, MD as Consulting Physician (Neurology) Randon Goldsmith, OT as Occupational Therapist (Occupational Therapy) Thana Ates, RN as [facility]  Recent office visits: 04/25/21: Patient presented to Dr. Rosanna Randy for allergic rhinitis. No medication changes noted.  03/06/21: Patient presented to Dr. Rosanna Randy for follow-up. Patient referred to nephrology 12/15/20: Patient presented to Laverna Peace,  FNP for UTI. Patient started on cephalexin 500 mg twice daily.  11/30/20: Patient presented to Dr. Rosanna Randy for follow-up. Patient referred to neurology for cognitive impairment. Mupirocin stopped (no longer taking)   Recent consult visits: 05/04/21: Patient presented to Dr. Lanora Manis (Nephrology). HCTZ decreased to 12.5 mg daily.  02/09/21: Patient presented to Dr. [name] (neurology) for cognitive impairment. MRI ordered. Recommended Vitamin B12 injections + PO, folic acid.   Hospital visits: 04/18/21: Patient presented to ED for Uvulitis 04/04/21: Patient presented to ED for abdominal pain  03/10/21: Patient presented to ED for ankle swelling   Objective:  Lab Results  Component Value Date   CREATININE 1.41 (H) 06/02/2021   BUN 19 06/02/2021   GFRNONAA 41 (L) 06/02/2021   GFRAA 42 (L) 12/05/2020   NA 142 06/02/2021   K 3.3 (L) 06/02/2021   CALCIUM 9.1 06/02/2021   CO2 27 06/02/2021    Lab Results  Component Value Date/Time   HGBA1C 5.5 03/06/2021 04:26 PM   HGBA1C 5.6 12/05/2020 08:55 AM   HGBA1C 6.3 01/18/2014 06:32 AM   MICROALBUR 20 06/23/2020 02:10 PM   MICROALBUR 50 11/18/2017 09:33 AM    Last diabetic Eye exam:  Lab Results  Component Value Date/Time   HMDIABEYEEXA No Retinopathy 12/03/2019 12:00 AM    Last diabetic Foot exam: No results found for: HMDIABFOOTEX   Lab Results  Component Value Date   CHOL 163 12/05/2020   HDL 61 12/05/2020   LDLCALC 86 12/05/2020   TRIG 84 12/05/2020   CHOLHDL 2.7 12/05/2020    Hepatic Function Latest Ref Rng & Units 06/02/2021 04/04/2021 03/06/2021  Total Protein 6.5 - 8.1 g/dL 6.7 7.2 -  Albumin 3.5 - 5.0 g/dL 3.5 3.6 3.9  AST 15 - 41  U/L 16 21 -  ALT 0 - 44 U/L 9 11 -  Alk Phosphatase 38 - 126 U/L 179(H) 150(H) -  Total Bilirubin 0.3 - 1.2 mg/dL 0.5 1.0 -    Lab Results  Component Value Date/Time   TSH 3.730 12/05/2020 08:55 AM   TSH 2.170 08/18/2020 11:36 AM    CBC Latest Ref Rng & Units 06/02/2021 04/18/2021 04/04/2021   WBC 4.0 - 10.5 K/uL 6.2 7.9 7.7  Hemoglobin 12.0 - 15.0 g/dL 11.1(L) 11.3(L) 11.9(L)  Hematocrit 36.0 - 46.0 % 34.4(L) 35.3(L) 36.4  Platelets 150 - 400 K/uL 277 347 171    Lab Results  Component Value Date/Time   VD25OH 8.7 (L) 06/16/2019 03:51 PM    Clinical ASCVD: Yes  The 10-year ASCVD risk score Mikey Bussing DC Jr., et al., 2013) is: 15.2%   Values used to calculate the score:     Age: 55 years     Sex: Female     Is Non-Hispanic African American: Yes     Diabetic: Yes     Tobacco smoker: No     Systolic Blood Pressure: 384 mmHg     Is BP treated: Yes     HDL Cholesterol: 61 mg/dL     Total Cholesterol: 163 mg/dL    Depression screen [facility] 2/9 06/01/2021 05/29/2021 04/26/2021  Decreased Interest 0 0 0  Down, Depressed, Hopeless 1 2 1   PHQ - 2 Score 1 2 1   Altered sleeping - 2 -  Tired, decreased energy - 3 -  Change in appetite - 3 -  Feeling bad or failure about yourself  - 2 -  Trouble concentrating - 2 -  Moving slowly or fidgety/restless - 2 -  Suicidal thoughts - 0 -  PHQ-9 Score - 16 -  Difficult doing work/chores - Somewhat difficult -  Some recent data might be hidden    Social History   Tobacco Use  Smoking Status Never  Smokeless Tobacco Never   BP Readings from Last 3 Encounters:  06/02/21 109/64  06/02/21 110/71  04/25/21 110/69   Pulse Readings from Last 3 Encounters:  06/02/21 63  06/02/21 66  04/25/21 85   Wt Readings from Last 3 Encounters:  06/02/21 131 lb 1.6 oz (59.5 kg)  04/25/21 129 lb (58.5 kg)  04/18/21 127 lb (57.6 kg)    Assessment/Interventions: Review of patient past medical history, allergies, medications, health status, including review of consultants reports, laboratory and other test data, was performed as part of comprehensive evaluation and provision of chronic care management services.   SDOH:  (Social Determinants of Health) assessments and interventions performed: Yes    CCM Care Plan  Allergies  Allergen Reactions    Lotrel [Amlodipine Besy-Benazepril Hcl] Anaphylaxis   Contrast Media [Iodinated Diagnostic Agents] Swelling and Other (See Comments)    Reaction:  Neck swelling    Niacin Hives   Sulfa Antibiotics Hives    Medications Reviewed Today     Reviewed by Johney Maine, RN (Registered Nurse) on 06/02/21 at 1116  Med List Status: <None>   Medication Order Taking? Sig Documenting Provider Last Dose Status Informant  cephALEXin (KEFLEX) 250 MG/5ML suspension [id number]  Take 10 mLs (500 mg total) by mouth 3 (three) times daily. Johnn Hai, PA-C  Active   cyclobenzaprine (FLEXERIL) 5 MG tablet [id number]  TAKE 1 TABLET BY MOUTH THREE TIMES A DAY AS NEEDED Jerrol Banana., MD  Active    Patient not taking:   Discontinued  05/15/20 1816   gabapentin (NEURONTIN) 100 MG capsule [id number]  TAKE 2 CAPSULES (200 MG TOTAL) BY MOUTH 3 (THREE) TIMES DAILY.  Patient taking differently: Take 200 mg by mouth 2 (two) times daily.   Jerrol Banana., MD  Active            Med Note Michaelle Birks, Cathe Mons A   Mon Feb 27, 2021  1:31 PM)    hydrochlorothiazide (HYDRODIURIL) 25 MG tablet [id number]  Take 1 tablet (25 mg total) by mouth daily.  Patient taking differently: Take 12.5 mg by mouth daily.   Jerrol Banana., MD  Active   LINZESS 290 MCG CAPS capsule [id number]  TAKE 1 CAPSULE (290 MCG TOTAL) BY MOUTH DAILY BEFORE BREAKFAST. Jerrol Banana., MD  Active   metFORMIN (GLUCOPHAGE) 500 MG tablet [id number]  TAKE 1 TABLET (500 MG TOTAL) BY MOUTH 2 (TWO) TIMES DAILY WITH A MEAL.  Patient taking differently: Take 500 mg by mouth daily with breakfast.   Jerrol Banana., MD  Active            Med Note Michaelle Birks, Cathe Mons A   Mon Feb 27, 2021  1:30 PM)    methocarbamol (ROBAXIN) 500 MG tablet [id number]  Take 500 mg by mouth 2 (two) times daily. Back pain [provider]  Active Self  metoprolol succinate (TOPROL-XL) 25 MG 24 hr tablet [id number]  Take 1 tablet (25 mg total) by  mouth daily. Jerrol Banana., MD  Active   omeprazole (PRILOSEC) 20 MG capsule [id number]  TAKE 1 CAPSULE BY MOUTH EVERY DAY Jerrol Banana., MD  Active   ondansetron [facility]) 4 MG tablet [id number]  Take 1 tablet (4 mg total) by mouth every 8 (eight) hours as needed. Pabon, [place] F, MD  Active   polyethylene glycol powder (GLYCOLAX/MIRALAX) 17 GM/SCOOP powder [id number]  Take 17 g by mouth daily as needed for moderate constipation. Harvest Dark, MD  Active   potassium chloride (KLOR-CON) 10 MEQ tablet [id number]  Take 1 tablet (10 mEq total) by mouth daily. Johnn Hai, PA-C  Active   Prasterone (INTRAROSA) 6.5 MG INST [id number]  Place 6.5 mg vaginally at bedtime.  Patient not taking: Reported on 06/01/2021   Gae Dry, MD  Active   predniSONE (DELTASONE) 10 MG tablet [id number]  Take 6 tablets  today, on day 2 take 5 tablets, day 3 take 4 tablets, day 4 take 3 tablets, day 5 take  2 tablets and 1 tablet the last day  Patient not taking: No sig reported   Johnn Hai, PA-C  Active   traMADol (ULTRAM) 50 MG tablet [id number]  Take 1 tablet (50 mg total) by mouth every 6 (six) hours as needed. Jerrol Banana., MD  Active   traZODone (DESYREL) 50 MG tablet [id number]  TAKE 1 TABLET (50 MG TOTAL) BY MOUTH AT BEDTIME AS NEEDED. Jerrol Banana., MD  Active   Vaginal Lubricant (REPLENS) GEL [id number]  Place 1 Applicatorful vaginally 3 (three) times a week. Gae Dry, MD  Active             Patient Active Problem List   Diagnosis Date Noted   Callus of foot 01/19/2021   Pes planus 01/19/2021   PTTD (posterior tibial tendon dysfunction) 01/19/2021   Acute flank pain 12/15/2020   Cystitis 12/15/2020   Urinary symptom or sign 12/15/2020   Low back pain radiating to right lower extremity 06/23/2020  Gastroesophageal reflux disease 04/28/2020   Large bowel obstruction ([place]) 04/17/2019   Acquired trigger finger 03/20/2019   Iron deficiency  anemia 11/21/2018   Syncope 03/18/2018   Anemia 03/18/2018   Exophthalmos 02/22/2018   Multiple thyroid nodules 02/22/2018   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   Pain at surgical site 08/30/2016   Ventral incisional hernia 08/21/2016   SBO (small bowel obstruction) (HCC)    Partial small bowel obstruction ([place]) 04/23/2016   Adjustment disorder with mixed anxiety and depressed mood 04/23/2016   Memory difficulties 09/01/2015   Arthritis 06/11/2015   Back pain, chronic 06/11/2015   HLD (hyperlipidemia) 06/11/2015   Cannot sleep 06/11/2015   L-S radiculopathy 06/11/2015   Arthritis of knee, degenerative 06/11/2015   B12 deficiency 06/11/2015   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   DM2 (diabetes mellitus, type 2) ([place]) 11/15/2012   Adiposity 11/21/2009   Avitaminosis D 11/07/2009   Narrowing of intervertebral disc space 07/25/2009   Benign essential HTN 07/25/2009    Immunization History  Administered Date(s) Administered   Fluad Quad(high Dose 65+) 08/15/2019, 09/01/2020   Influenza Split 10/17/2010, 10/02/2011, 08/27/2012   Influenza, High Dose Seasonal PF 08/29/2018   Influenza,inj,Quad PF,6+ Mos 09/26/2013, 09/06/2014, 09/06/2016   Influenza,inj,Quad PF,6-35 Mos 08/31/2017   Influenza-Unspecified 09/15/2015   PFIZER(Purple Top)SARS-COV-2 Vaccination 01/29/2020, 02/23/2020, 10/11/2020   Pneumococcal Conjugate-13 08/29/2018   Pneumococcal Polysaccharide-23 11/17/2012   Tdap 10/02/2011, 09/27/2016   Zoster Recombinat (Shingrix) 08/29/2018, 08/15/2019   Zoster, Live 09/06/2016    Conditions to be addressed/monitored:  Hypertension, Hyperlipidemia, Diabetes, GERD and Chronic Pain  Care Plan : General Pharmacy (Adult)  Updates made by Germaine Pomfret, [place] since 06/16/2021 12:00 AM     Problem: Hypertension, Hyperlipidemia, Diabetes, GERD and Chronic Pain   Priority: High     Long-Range Goal:  Patient-Specific Goal   Start Date: 02/27/2021  Expected End Date: 08/30/2021  Recent Progress: On track  Priority: High  Note:   Current Barriers:  Unable to achieve control of cholesterol  Suboptimal therapeutic regimen for hypertension  Pharmacist Clinical Goal(s):  patient will achieve control of cholesterol as evidenced by LDL less than 70 maintain control of blood pressure as evidenced by BP less than 140/90 and improvement of dizziness symptoms  through collaboration with PharmD and provider.   Interventions: 1:1 collaboration with Jerrol Banana., MD regarding development and update of comprehensive plan of care as evidenced by provider attestation and co-signature Inter-disciplinary care team collaboration (see longitudinal plan of care) Comprehensive medication review performed; medication list updated in electronic medical record  Hypertension (BP goal <140/90) -Not ideally controlled -Current treatment: HCTZ 25 mg daily  Metoprolol XL 25 mg daily  -Medications previously tried: NA  -Current home readings: Systolic 70-263Z  -Current dietary habits: Patient is not following any specific dietary regimen -Current exercise habits: Patient is not following any specific exerciseregimen -Reports hypotensive symptoms -Patient reports frequent dizziness, especially when bending over.  -Continue current medications  Hyperlipidemia: (LDL goal < 70) -History of MI 1999 -Uncontrolled -Current treatment: None -Medications previously tried: NA  -Discussed at length with patient the need to prevent future CV events, which patient was amenable to. Patient was agreeable to starting a statin to reduce her risk of heart disease.  -Educated on Benefits of statin for ASCVD risk reduction;  -Continue current medications   Diabetes (A1c goal <7%) -Controlled -Current medications: Metformin 500 mg daily  -Medications previously tried: NA  -Current  home glucose readings fasting  glucose: NA post prandial glucose: NA -Denies hypoglycemic/hyperglycemic symptoms -Educated on Benefits of routine self-monitoring of blood sugar; Carbohydrate counting and/or plate method -Continue current medications  Dementia (Goal: Prevent further decline in memory) -Managed by Dr. [name] -Uncontrolled -Current treatment  None -Medications previously tried: NA -Neurology recommended folic acid $RemoveBe'1mg'LAMVcDSbE$  daily, was not started.  -Recommended starting folic acid 4 mg daily  -Continue current medications  Patient Goals/Self-Care Activities patient will:  - check blood pressure 2-3 times weekly, document, and provide at future appointments target a minimum of 150 minutes of moderate intensity exercise weekly  Follow Up Plan: Telephone follow up appointment with care management team member scheduled for:  06/16/2021 at 11:00 AM       Medication Assistance: None required.  Patient affirms current coverage meets needs.  Patient's preferred pharmacy is:  [id number] - [place], [place] [address] [place] [address] Phone: [phone] Fax: [phone]  Uses pill box? Yes Pt endorses 100% compliance  We discussed: Current pharmacy is preferred with insurance plan and patient is satisfied with pharmacy services Patient decided to: Continue current medication management strategy  Care Plan and Follow Up Patient Decision:  Patient agrees to Care Plan and Follow-up.  Plan: Telephone follow up appointment with care management team member scheduled for:  06/16/2021 at 11:00 AM  [place] [phone]

## 2021-06-16 NOTE — Patient Instructions (Signed)
Visit Information It was great speaking with you today!  Please let me know if you have any questions about our visit.   Goals Addressed             This Visit's Progress    Monitor and Manage My Blood Sugar-Diabetes Type 2   On track    Timeframe:  Long-Range Goal Priority:  High Start Date:  02/27/2021                           Expected End Date: 08/30/2021                      Follow Up Date 04/15/2021    - check blood sugar at prescribed times - check blood sugar if I feel it is too high or too low    Why is this important?   Checking your blood sugar at home helps to keep it from getting very high or very low.  Writing the results in a diary or log helps the doctor know how to care for you.  Your blood sugar log should have the time, date and the results.  Also, write down the amount of insulin or other medicine that you take.  Other information, like what you ate, exercise done and how you were feeling, will also be helpful.     Notes:          Patient Care Plan: General Pharmacy (Adult)     Problem Identified: Hypertension, Hyperlipidemia, Diabetes, GERD and Chronic Pain   Priority: High     Long-Range Goal: Patient-Specific Goal   Start Date: 02/27/2021  Expected End Date: 08/30/2021  Recent Progress: On track  Priority: High  Note:   Current Barriers:  Unable to achieve control of cholesterol  Suboptimal therapeutic regimen for hypertension  Pharmacist Clinical Goal(s):  patient will achieve control of cholesterol as evidenced by LDL less than 70 maintain control of blood pressure as evidenced by BP less than 140/90 and improvement of dizziness symptoms  through collaboration with PharmD and provider.   Interventions: 1:1 collaboration with Jerrol Banana., MD regarding development and update of comprehensive plan of care as evidenced by provider attestation and co-signature Inter-disciplinary care team collaboration (see longitudinal plan of  care) Comprehensive medication review performed; medication list updated in electronic medical record  Hypertension (BP goal <140/90) -Not ideally controlled -Current treatment: HCTZ 25 mg daily  Metoprolol XL 25 mg daily  -Medications previously tried: NA  -Current home readings: Systolic 76-283T  -Current dietary habits: Patient is not following any specific dietary regimen -Current exercise habits: Patient is not following any specific exerciseregimen -Reports hypotensive symptoms -Patient reports frequent dizziness, especially when bending over.  -Continue current medications  Hyperlipidemia: (LDL goal < 70) -History of MI 1999 -Uncontrolled -Current treatment: None -Medications previously tried: NA  -Discussed at length with patient the need to prevent future CV events, which patient was amenable to. Patient was agreeable to starting a statin to reduce her risk of heart disease.  -Educated on Benefits of statin for ASCVD risk reduction;  -Continue current medications   Diabetes (A1c goal <7%) -Controlled -Current medications: Metformin 500 mg daily  -Medications previously tried: NA  -Current home glucose readings fasting glucose: NA post prandial glucose: NA -Denies hypoglycemic/hyperglycemic symptoms -Educated on Benefits of routine self-monitoring of blood sugar; Carbohydrate counting and/or plate method -Continue current medications  Dementia (Goal: Prevent further decline in memory) -  Managed by Dr. [name] -Uncontrolled -Current treatment  None -Medications previously tried: NA -Neurology recommended folic acid 1mg  daily, was not started.  -Recommended starting folic acid 4 mg daily  -Continue current medications  Patient Goals/Self-Care Activities patient will:  - check blood pressure 2-3 times weekly, document, and provide at future appointments target a minimum of 150 minutes of moderate intensity exercise weekly  Follow Up Plan: Telephone follow up  appointment with care management team member scheduled for:  06/16/2021 at 11:00 AM      Patient agreed to services and verbal consent obtained.   The patient verbalized understanding of instructions, educational materials, and care plan provided today and declined offer to receive copy of patient instructions, educational materials, and care plan.   [name], PharmD, [place] [phone]

## 2021-06-19 ENCOUNTER — Emergency Department: Payer: Medicare HMO

## 2021-06-19 ENCOUNTER — Emergency Department
Admission: EM | Admit: 2021-06-19 | Discharge: 2021-06-20 | Disposition: A | Discharge status: Home / Self Care | Payer: Medicare HMO | Attending: Emergency Medicine | Admitting: Emergency Medicine

## 2021-06-19 ENCOUNTER — Other Ambulatory Visit: Payer: Self-pay

## 2021-06-19 DIAGNOSIS — I10 Essential (primary) hypertension: Secondary | ICD-10-CM | POA: Insufficient documentation

## 2021-06-19 DIAGNOSIS — J392 Other diseases of pharynx: Secondary | ICD-10-CM | POA: Diagnosis not present

## 2021-06-19 DIAGNOSIS — J387 Other diseases of larynx: Secondary | ICD-10-CM | POA: Diagnosis not present

## 2021-06-19 DIAGNOSIS — K209 Esophagitis, unspecified without bleeding: Secondary | ICD-10-CM | POA: Diagnosis not present

## 2021-06-19 DIAGNOSIS — Z7984 Long term (current) use of oral hypoglycemic drugs: Secondary | ICD-10-CM | POA: Insufficient documentation

## 2021-06-19 DIAGNOSIS — J029 Acute pharyngitis, unspecified: Secondary | ICD-10-CM | POA: Diagnosis present

## 2021-06-19 DIAGNOSIS — Z8616 Personal history of COVID-19: Secondary | ICD-10-CM | POA: Insufficient documentation

## 2021-06-19 DIAGNOSIS — E041 Nontoxic single thyroid nodule: Secondary | ICD-10-CM | POA: Diagnosis not present

## 2021-06-19 DIAGNOSIS — Z79899 Other long term (current) drug therapy: Secondary | ICD-10-CM | POA: Insufficient documentation

## 2021-06-19 DIAGNOSIS — E119 Type 2 diabetes mellitus without complications: Secondary | ICD-10-CM | POA: Insufficient documentation

## 2021-06-19 LAB — CBC WITH DIFFERENTIAL/PLATELET
Abs Immature Granulocytes: 0.05 10*3/uL (ref 0.00–0.07)
Basophils Absolute: 0 10*3/uL (ref 0.0–0.1)
Basophils Relative: 0 %
Eosinophils Absolute: 0.1 10*3/uL (ref 0.0–0.5)
Eosinophils Relative: 1 %
HCT: 34.8 % — ABNORMAL LOW (ref 36.0–46.0)
Hemoglobin: 10.9 g/dL — ABNORMAL LOW (ref 12.0–15.0)
Immature Granulocytes: 1 %
Lymphocytes Relative: 12 %
Lymphs Abs: 1.2 10*3/uL (ref 0.7–4.0)
MCH: 28.9 pg (ref 26.0–34.0)
MCHC: 31.3 g/dL (ref 30.0–36.0)
MCV: 92.3 fL (ref 80.0–100.0)
Monocytes Absolute: 0.7 10*3/uL (ref 0.1–1.0)
Monocytes Relative: 7 %
Neutro Abs: 8.1 10*3/uL — ABNORMAL HIGH (ref 1.7–7.7)
Neutrophils Relative %: 79 %
Platelets: 247 10*3/uL (ref 150–400)
RBC: 3.77 MIL/uL — ABNORMAL LOW (ref 3.87–5.11)
RDW: 13.2 % (ref 11.5–15.5)
WBC: 10.1 10*3/uL (ref 4.0–10.5)
nRBC: 0 % (ref 0.0–0.2)

## 2021-06-19 MED ORDER — MORPHINE SULFATE (PF) 4 MG/ML IV SOLN
4.0000 mg | Freq: Once | INTRAVENOUS | Status: AC
  Administered 2021-06-19: 4 mg via INTRAVENOUS
  Filled 2021-06-19: qty 1

## 2021-06-19 MED ORDER — ONDANSETRON HCL 4 MG/2ML IJ SOLN
4.0000 mg | Freq: Once | INTRAMUSCULAR | Status: AC
  Administered 2021-06-19: 4 mg via INTRAVENOUS
  Filled 2021-06-19: qty 2

## 2021-06-19 MED ORDER — ALUM & MAG HYDROXIDE-SIMETH 200-200-20 MG/5ML PO SUSP
30.0000 mL | Freq: Once | ORAL | Status: AC
  Administered 2021-06-19: 30 mL via ORAL
  Filled 2021-06-19: qty 30

## 2021-06-19 MED ORDER — LIDOCAINE VISCOUS HCL 2 % MT SOLN
15.0000 mL | Freq: Once | OROMUCOSAL | Status: AC
  Administered 2021-06-19: 15 mL via ORAL
  Filled 2021-06-19: qty 15

## 2021-06-19 NOTE — ED Provider Notes (Addendum)
[facility] Emergency Department Provider Note  ____________________________________________   Event Date/Time   First MD Initiated Contact with Patient 06/19/21 2126     (approximate)  I have reviewed the triage vital signs and the nursing notes.   HISTORY  Chief Complaint Sore Throat    HPI Julie Acevedo is a 69 y.o. female presents emergency department complaint of sore throat.  Patient was seen 2 months ago for the same.  Patient states that this morning she woke up thought her throat had closed shut and she had a very high-pitched voice similar to when you inhale helium.  Patient states she has a nodule on her thyroid so unsure if it has enlarged.  She is having a lot of pain of the sides of the neck.  She also feels weak and tired.  She denies any fever or chills.  Patient has chronic pain with her back.  Past Medical History:  Diagnosis Date   Anemia    Arthritis    COVID-19 11/19/2019   Diverticulitis    DM (diabetes mellitus) ([place])    typ e 2   History of methicillin resistant staphylococcus aureus (MRSA) 2017   HTN (hypertension)    Hyperlipidemia    Memory difficulties 09/01/2015   Multiple thyroid nodules    Myocardial infarction ([place]) 1995   Short-term memory loss     Patient Active Problem List   Diagnosis Date Noted   Callus of foot 01/19/2021   Pes planus 01/19/2021   PTTD (posterior tibial tendon dysfunction) 01/19/2021   Acute flank pain 12/15/2020   Cystitis 12/15/2020   Urinary symptom or sign 12/15/2020   Low back pain radiating to right lower extremity 06/23/2020   Gastroesophageal reflux disease 04/28/2020   Large bowel obstruction ([place]) 04/17/2019   Acquired trigger finger 03/20/2019   Iron deficiency anemia 11/21/2018   Syncope 03/18/2018   Anemia 03/18/2018   Exophthalmos 02/22/2018   Multiple thyroid nodules 02/22/2018   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   Pain at surgical site 08/30/2016   Ventral incisional  hernia 08/21/2016   SBO (small bowel obstruction) (HCC)    Partial small bowel obstruction ([place]) 04/23/2016   Adjustment disorder with mixed anxiety and depressed mood 04/23/2016   Memory difficulties 09/01/2015   Arthritis 06/11/2015   Back pain, chronic 06/11/2015   HLD (hyperlipidemia) 06/11/2015   Cannot sleep 06/11/2015   L-S radiculopathy 06/11/2015   Arthritis of knee, degenerative 06/11/2015   B12 deficiency 06/11/2015   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   DM2 (diabetes mellitus, type 2) ([place]) 11/15/2012   Adiposity 11/21/2009   Avitaminosis D 11/07/2009   Narrowing of intervertebral disc space 07/25/2009   Benign essential HTN 07/25/2009    Past Surgical History:  Procedure Laterality Date   ABDOMINAL HYSTERECTOMY     due to endometriosis-1 ovary left   BREAST EXCISIONAL BIOPSY Right 1971   neg   CARDIAC CATHETERIZATION  2000   no significant CAD per note of Dr Caryl Comes in 2013    [place] N/A 03/19/2017   Procedure: ANTERIOR REPAIR (CYSTOCELE);  Surgeon: Bjorn Loser, MD;  Location: [facility];  Service: Urology;  Laterality: N/A;   CYSTOSCOPY N/A 03/19/2017   Procedure: CYSTOSCOPY;  Surgeon: Bjorn Loser, MD;  Location: [facility];  Service: Urology;  Laterality: N/A;   EYE SURGERY     FOOT SURGERY     HAND SURGERY  HEMICOLECTOMY     INSERTION OF MESH N/A 08/21/2016   Procedure: INSERTION OF MESH;  Surgeon: Excell Seltzer, MD;  Location: [facility];  Service: General;  Laterality: N/A;   INSERTION OF MESH N/A 12/08/2019   Procedure: INSERTION OF MESH;  Surgeon: Jules Husbands, MD;  Location: [facility];  Service: General;  Laterality: N/A;   LAPAROSCOPIC LYSIS OF ADHESIONS N/A 08/21/2016   Procedure: LAPAROSCOPIC LYSIS OF ADHESIONS;  Surgeon: Excell Seltzer, MD;  Location: [facility];  Service: General;  Laterality: N/A;   LAPAROSCOPIC REMOVAL ABDOMINAL MASS     LAPAROTOMY  N/A 04/16/2019   Procedure: EXPLORATORY LAPAROTOMY;  Surgeon: Benjamine Sprague, DO;  Location: [facility];  Service: General;  Laterality: N/A;   LOOP RECORDER INSERTION N/A 03/21/2018   Procedure: LOOP RECORDER INSERTION;  Surgeon: Sanda Klein, MD;  Location: [facility];  Service: Cardiovascular;  Laterality: N/A;   LUMBAR DISC SURGERY     ROUX-EN-Y GASTRIC BYPASS  2010   Dr Hassell Done   SMALL INTESTINE SURGERY     TONSILLECTOMY     UPPER GI ENDOSCOPY  01/12/2016   normal larynx, normal esophagus. gastric bypass with a normal-sized pouch and intact staple line, normal examined jejunum, otherwise exam was normal   VENTRAL HERNIA REPAIR N/A 08/21/2016   Procedure: LAPAROSCOPIC VENTRAL HERNIA;  Surgeon: Excell Seltzer, MD;  Location: [facility];  Service: General;  Laterality: N/A;   XI ROBOTIC ASSISTED VENTRAL HERNIA N/A 12/08/2019   Procedure: XI ROBOTIC ASSISTED VENTRAL HERNIA;  Surgeon: Jules Husbands, MD;  Location: [facility];  Service: General;  Laterality: N/A;    Prior to Admission medications   Medication Sig Start Date End Date Taking? Authorizing Provider  lidocaine (XYLOCAINE) 2 % solution Use as directed 15 mLs in the mouth or throat as needed for mouth pain. 06/20/21  Yes Fremon Zacharia, Linden Dolin, PA-C  pantoprazole (PROTONIX) 40 MG tablet Take 1 tablet (40 mg total) by mouth daily. 06/20/21 06/20/22 Yes Bastien Strawser, Linden Dolin, PA-C  amoxicillin (AMOXIL) 500 MG capsule Take 1 capsule (500 mg total) by mouth 3 (three) times daily. 06/20/21   Jestin Burbach, Linden Dolin, PA-C  cyclobenzaprine (FLEXERIL) 5 MG tablet TAKE 1 TABLET BY MOUTH THREE TIMES A DAY AS NEEDED 05/22/21   Jerrol Banana., MD  gabapentin (NEURONTIN) 100 MG capsule TAKE 2 CAPSULES (200 MG TOTAL) BY MOUTH 3 (THREE) TIMES DAILY. Patient taking differently: Take 200 mg by mouth 2 (two) times daily. 01/24/21   Jerrol Banana., MD  hydrochlorothiazide (HYDRODIURIL) 25 MG tablet Take 1 tablet (25 mg total) by mouth daily. Patient taking  differently: Take 12.5 mg by mouth daily. 04/06/21   Jerrol Banana., MD  LINZESS 290 MCG CAPS capsule TAKE 1 CAPSULE (290 MCG TOTAL) BY MOUTH DAILY BEFORE BREAKFAST. 04/04/21   Jerrol Banana., MD  metFORMIN (GLUCOPHAGE) 500 MG tablet TAKE 1 TABLET (500 MG TOTAL) BY MOUTH 2 (TWO) TIMES DAILY WITH A MEAL. Patient taking differently: Take 500 mg by mouth daily with breakfast. 01/10/21   Jerrol Banana., MD  methocarbamol (ROBAXIN) 500 MG tablet Take 500 mg by mouth 2 (two) times daily. Back pain 05/22/19   [provider]  metoprolol succinate (TOPROL-XL) 25 MG 24 hr tablet Take 1 tablet (25 mg total) by mouth daily. 04/06/21   Jerrol Banana., MD  ondansetron (ZOFRAN) 4 MG tablet Take 1 tablet (4 mg total) by mouth every 8 (eight) hours as needed. 03/20/21  Pabon, Diego F, MD  polyethylene glycol powder (GLYCOLAX/MIRALAX) 17 GM/SCOOP powder Take 17 g by mouth daily as needed for moderate constipation. 04/04/21   Harvest Dark, MD  potassium chloride (KLOR-CON) 10 MEQ tablet Take 1 tablet (10 mEq total) by mouth daily. 04/18/21   Johnn Hai, PA-C  Prasterone (INTRAROSA) 6.5 MG INST Place 6.5 mg vaginally at bedtime. Patient not taking: No sig reported 02/08/21   Gae Dry, MD  traMADol (ULTRAM) 50 MG tablet Take 1 tablet (50 mg total) by mouth every 6 (six) hours as needed. 01/13/21   Jerrol Banana., MD  traZODone (DESYREL) 50 MG tablet TAKE 1 TABLET (50 MG TOTAL) BY MOUTH AT BEDTIME AS NEEDED. 08/17/20   Jerrol Banana., MD  Vaginal Lubricant (REPLENS) GEL Place 1 Applicatorful vaginally 3 (three) times a week. 11/30/20   Gae Dry, MD  diphenhydrAMINE (BENADRYL) 50 MG tablet Patient is to take one tablet 1 hour before test. Patient not taking: Reported on 04/28/2020 04/04/20 05/15/20  Jules Husbands, MD    Allergies Lotrel [amlodipine besy-benazepril hcl], Contrast media [iodinated diagnostic agents], Niacin, and Sulfa  antibiotics  Family History  Problem Relation Age of Onset   Cancer Father 86       Lung   Coronary artery disease Father 45   COPD Mother    Diabetes Mother    Hypertension Mother    Cancer Paternal Grandmother        Breast   Breast cancer Paternal Grandmother    Congestive Heart Failure Maternal Grandmother    Emphysema Maternal Grandfather    Heart attack Paternal Grandfather    Cancer Paternal Aunt        Breast   Breast cancer Paternal Aunt 69   Breast cancer Paternal Aunt 75    Social History Social History   Tobacco Use   Smoking status: Never   Smokeless tobacco: Never  Vaping Use   Vaping Use: Never used  Substance Use Topics   Alcohol use: No   Drug use: No    Review of Systems  Constitutional: No fever/chills Eyes: No visual changes. ENT: Positive sore throat. Respiratory: Denies cough Cardiovascular: Denies chest pain Gastrointestinal: Denies abdominal pain Genitourinary: Negative for dysuria. Musculoskeletal: Negative for back pain. Skin: Negative for rash. Psychiatric: no mood changes,     ____________________________________________   PHYSICAL EXAM:  VITAL SIGNS: ED Triage Vitals  Enc Vitals Group     BP 06/19/21 2053 121/71     Pulse Rate 06/19/21 2053 82     Resp 06/19/21 2053 20     Temp 06/19/21 2053 98.8 F (37.1 C)     Temp src --      SpO2 06/19/21 2053 100 %     Weight 06/19/21 2051 132 lb (59.9 kg)     Height 06/19/21 2051 5\' 3"  (1.6 m)     Head Circumference --      Peak Flow --      Pain Score 06/19/21 2051 7     Pain Loc --      Pain Edu? --      Excl. in [place]? --     Constitutional: Alert and oriented. Well appearing and in no acute distress.  Patient appears to be tired and weak Eyes: Conjunctivae are normal.  Head: Atraumatic. Nose: No congestion/rhinnorhea. Mouth/Throat: Mucous membranes are moist.  Throat is irritated and red, minimal swelling noted posteriorly Neck:  supple no lymphadenopathy noted, neck  is  tender along the posterior and anterior musculature Cardiovascular: Normal rate, regular rhythm. Heart sounds are normal Respiratory: Normal respiratory effort.  No retractions, lungs c t a  GU: deferred Musculoskeletal: FROM all extremities, warm and well perfused Neurologic:  Normal speech and language.  Skin:  Skin is warm, dry and intact. No rash noted. Psychiatric: Mood and affect are normal. Speech and behavior are normal.  ____________________________________________   LABS (all labs ordered are listed, but only abnormal results are displayed)  Labs Reviewed  COMPREHENSIVE METABOLIC PANEL - Abnormal; Notable for the following components:      Result Value   Creatinine, Ser 1.57 (*)    Calcium 8.5 (*)    Albumin 3.4 (*)    AST 13 (*)    Alkaline Phosphatase 168 (*)    GFR, Estimated 36 (*)    All other components within normal limits  CBC WITH DIFFERENTIAL/PLATELET - Abnormal; Notable for the following components:   RBC 3.77 (*)    Hemoglobin 10.9 (*)    HCT 34.8 (*)    Neutro Abs 8.1 (*)    All other components within normal limits   ____________________________________________   ____________________________________________  RADIOLOGY  CT soft tissue of the neck without contrast as patient has contrast allergy and decreased kidney function  ____________________________________________   PROCEDURES  Procedure(s) performed: No  Procedures    ____________________________________________   INITIAL IMPRESSION / ASSESSMENT AND PLAN / ED COURSE  Pertinent labs & imaging results that were available during my care of the patient were reviewed by me and considered in my medical decision making (see chart for details).   The patient 70 year old female presents with sore throat.  See HPI.  Physical exam shows patient to appear tired but stable.  DDx: Esophagitis, abscess, uvulitis, laryngitis  CBC has normal WBC, H&H are decreased to 10.9 and 34.8,  neutrophils are increased 0.1, complete metabolic panel still pending  CT soft tissue of the neck without contrast due to contrast allergy and decreased kidney function was reviewed by me and confirmed by radiology to have no abscess.  Radiologist does mention there is esophageal thickening which be typical of esophagitis.  I do feel the patient most likely does have esophagitis.  Could be a burn from gastric reflux in the posterior pharynx.  She was given a GI cocktail to see if this helps her symptoms.  Metabolic panel appears to be normal trend for the patient.  I did discuss all the findings with the patient.  Encouraged her to follow-up with GI for a endoscopy.  She has difficulty with her right eye due to hyperthyroidism and I suggested she follow-up with Dr. George Ina due to dry eye.  She is to return emergency department worsening.  She was discharged in stable condition.  Julie Acevedo was evaluated in Emergency Department on 06/20/2021 for the symptoms described in the history of present illness. She was evaluated in the context of the global COVID-19 pandemic, which necessitated consideration that the patient might be at risk for infection with the SARS-CoV-2 virus that causes COVID-19. Institutional protocols and algorithms that pertain to the evaluation of patients at risk for COVID-19 are in a state of rapid change based on information released by regulatory bodies including the CDC and federal and state organizations. These policies and algorithms were followed during the patient's care in the ED.    As part of my medical decision making, I reviewed the following data within the [place] notes  reviewed and incorporated, Labs reviewed , Old chart reviewed, Radiograph reviewed , Notes from prior ED visits, and [place] Controlled Substance Database  ____________________________________________   FINAL CLINICAL IMPRESSION(S) / ED DIAGNOSES  Final diagnoses:   Esophagitis      NEW MEDICATIONS STARTED DURING THIS VISIT:  New Prescriptions   AMOXICILLIN (AMOXIL) 500 MG CAPSULE    Take 1 capsule (500 mg total) by mouth 3 (three) times daily.   LIDOCAINE (XYLOCAINE) 2 % SOLUTION    Use as directed 15 mLs in the mouth or throat as needed for mouth pain.   PANTOPRAZOLE (PROTONIX) 40 MG TABLET    Take 1 tablet (40 mg total) by mouth daily.     Note:  This document was prepared using Dragon voice recognition software and may include unintentional dictation errors.    Versie Starks, PA-C 06/20/21 0040    Versie Starks, PA-C 06/20/21 0041    Nance Pear, MD 06/22/21 [phone]

## 2021-06-19 NOTE — ED Triage Notes (Signed)
Pt states she has a sore throat and concerned that it could swell shut. Pt states she had swelling earlier, but it has gown down. Pt able to speak in full sentences and no respiratory distress noted at this time. Pt states that she had this same problem 2 months ago and was diagnosed with an infection, it treated it, but now it is back.

## 2021-06-20 ENCOUNTER — Encounter: Payer: Self-pay | Admitting: Podiatry

## 2021-06-20 ENCOUNTER — Ambulatory Visit (INDEPENDENT_AMBULATORY_CARE_PROVIDER_SITE_OTHER): Payer: Medicare HMO | Admitting: Podiatry

## 2021-06-20 DIAGNOSIS — D2372 Other benign neoplasm of skin of left lower limb, including hip: Secondary | ICD-10-CM

## 2021-06-20 DIAGNOSIS — Q828 Other specified congenital malformations of skin: Secondary | ICD-10-CM | POA: Diagnosis not present

## 2021-06-20 LAB — COMPREHENSIVE METABOLIC PANEL
ALT: 8 U/L (ref 0–44)
AST: 13 U/L — ABNORMAL LOW (ref 15–41)
Albumin: 3.4 g/dL — ABNORMAL LOW (ref 3.5–5.0)
Alkaline Phosphatase: 168 U/L — ABNORMAL HIGH (ref 38–126)
Anion gap: 7 (ref 5–15)
BUN: 22 mg/dL (ref 8–23)
CO2: 29 mmol/L (ref 22–32)
Calcium: 8.5 mg/dL — ABNORMAL LOW (ref 8.9–10.3)
Chloride: 103 mmol/L (ref 98–111)
Creatinine, Ser: 1.57 mg/dL — ABNORMAL HIGH (ref 0.44–1.00)
GFR, Estimated: 36 mL/min — ABNORMAL LOW (ref >60)
Glucose, Bld: 85 mg/dL (ref 70–99)
Potassium: 3.6 mmol/L (ref 3.5–5.1)
Sodium: 139 mmol/L (ref 135–145)
Total Bilirubin: 0.4 mg/dL (ref 0.3–1.2)
Total Protein: 6.6 g/dL (ref 6.5–8.1)

## 2021-06-20 MED ORDER — AMOXICILLIN 500 MG PO CAPS: 500.0000 mg | ORAL_CAPSULE | Freq: Three times a day (TID) | ORAL | 0 refills | Status: DC

## 2021-06-20 MED ORDER — PANTOPRAZOLE SODIUM 40 MG PO TBEC: 40.0000 mg | DELAYED_RELEASE_TABLET | Freq: Every day | ORAL | 1 refills | Status: DC

## 2021-06-20 MED ORDER — LIDOCAINE VISCOUS HCL 2 % MT SOLN: 15.0000 mL | OROMUCOSAL | 0 refills | Status: DC | PRN

## 2021-06-20 NOTE — Discharge Instructions (Addendum)
Use Maalox 10 mL mixed with 54mL of viscous lidocaine for your throat pain Use the antibiotic as prescribed Follow-up with the eye doctor for your eye problems Follow-up with [facility] gastroenterology for the esophageal problems.  You need a endoscopy Return emergency department if worse

## 2021-06-20 NOTE — Progress Notes (Signed)
Subjective:  Patient ID: Julie Acevedo, female    DOB: 09/08/1951,  MRN: [id number]  Chief Complaint  Patient presents with   Callouses    Left foot     70 y.o. female presents with the above complaint.  Patient presents with follow left submetatarsal 1 and fifth benign skin lesion/porokeratosis.  She states that it has come back and still painful to touch.  The injection helped a little bit.  She would like to hold off on any further injection.  She denies any other acute complaints   Review of Systems: Negative except as noted in the HPI. Denies N/V/F/Ch.  Past Medical History:  Diagnosis Date   Anemia    Arthritis    COVID-19 11/19/2019   Diverticulitis    DM (diabetes mellitus) ([place])    typ e 2   History of methicillin resistant staphylococcus aureus (MRSA) 2017   HTN (hypertension)    Hyperlipidemia    Memory difficulties 09/01/2015   Multiple thyroid nodules    Myocardial infarction ([place]) 1995   Short-term memory loss     Current Outpatient Medications:    amoxicillin (AMOXIL) 500 MG capsule, Take 1 capsule (500 mg total) by mouth 3 (three) times daily., Disp: 30 capsule, Rfl: 0   cyclobenzaprine (FLEXERIL) 5 MG tablet, TAKE 1 TABLET BY MOUTH THREE TIMES A DAY AS NEEDED, Disp: 60 tablet, Rfl: 3   gabapentin (NEURONTIN) 100 MG capsule, TAKE 2 CAPSULES (200 MG TOTAL) BY MOUTH 3 (THREE) TIMES DAILY. (Patient taking differently: Take 200 mg by mouth 2 (two) times daily.), Disp: 540 capsule, Rfl: 1   hydrochlorothiazide (HYDRODIURIL) 25 MG tablet, Take 1 tablet (25 mg total) by mouth daily. (Patient taking differently: Take 12.5 mg by mouth daily.), Disp: 90 tablet, Rfl: 1   lidocaine (XYLOCAINE) 2 % solution, Use as directed 15 mLs in the mouth or throat as needed for mouth pain., Disp: 100 mL, Rfl: 0   LINZESS 290 MCG CAPS capsule, TAKE 1 CAPSULE (290 MCG TOTAL) BY MOUTH DAILY BEFORE BREAKFAST., Disp: 90 capsule, Rfl: 0   metFORMIN (GLUCOPHAGE) 500 MG tablet, TAKE 1 TABLET  (500 MG TOTAL) BY MOUTH 2 (TWO) TIMES DAILY WITH A MEAL. (Patient taking differently: Take 500 mg by mouth daily with breakfast.), Disp: 180 tablet, Rfl: 3   methocarbamol (ROBAXIN) 500 MG tablet, Take 500 mg by mouth 2 (two) times daily. Back pain, Disp: , Rfl:    metoprolol succinate (TOPROL-XL) 25 MG 24 hr tablet, Take 1 tablet (25 mg total) by mouth daily., Disp: 90 tablet, Rfl: 1   ondansetron (ZOFRAN) 4 MG tablet, Take 1 tablet (4 mg total) by mouth every 8 (eight) hours as needed., Disp: 20 tablet, Rfl: 0   pantoprazole (PROTONIX) 40 MG tablet, Take 1 tablet (40 mg total) by mouth daily., Disp: 30 tablet, Rfl: 1   polyethylene glycol powder (GLYCOLAX/MIRALAX) 17 GM/SCOOP powder, Take 17 g by mouth daily as needed for moderate constipation., Disp: 255 g, Rfl: 0   potassium chloride (KLOR-CON) 10 MEQ tablet, Take 1 tablet (10 mEq total) by mouth daily., Disp: 7 tablet, Rfl: 0   Prasterone (INTRAROSA) 6.5 MG INST, Place 6.5 mg vaginally at bedtime. (Patient not taking: No sig reported), Disp: 30 each, Rfl: 11   traMADol (ULTRAM) 50 MG tablet, Take 1 tablet (50 mg total) by mouth every 6 (six) hours as needed., Disp: 100 tablet, Rfl: 2   traZODone (DESYREL) 50 MG tablet, TAKE 1 TABLET (50 MG TOTAL) BY MOUTH AT  BEDTIME AS NEEDED., Disp: 30 tablet, Rfl: 11   Vaginal Lubricant (REPLENS) GEL, Place 1 Applicatorful vaginally 3 (three) times a week., Disp: 35 g, Rfl: 11  Social History   Tobacco Use  Smoking Status Never  Smokeless Tobacco Never    Allergies  Allergen Reactions   Lotrel [Amlodipine Besy-Benazepril Hcl] Anaphylaxis   Contrast Media [Iodinated Diagnostic Agents] Swelling and Other (See Comments)    Reaction:  Neck swelling    Niacin Hives   Sulfa Antibiotics Hives   Objective:  There were no vitals filed for this visit. There is no height or weight on file to calculate BMI. Constitutional Well developed. Well nourished.  Vascular Dorsalis pedis pulses palpable  bilaterally. Posterior tibial pulses palpable bilaterally. Capillary refill normal to all digits.  No cyanosis or clubbing noted. Pedal hair growth normal.  Neurologic Normal speech. Oriented to person, place, and time. Epicritic sensation to light touch grossly present bilaterally.  Dermatologic  pain on palpation to left first and fifth metatarsophalangeal joint.  Pain with range of motion left first and fifth MPJ.   porokeratosis noted at the first and fifth MPJ as well which includes hyperkeratotic lesion with central nucleated core.  Pain on palpation to the lesion  Orthopedic: Normal joint ROM without pain or crepitus bilaterally. No visible deformities. No bony tenderness.   Radiographs: None Assessment:   1. Porokeratosis   2. Benign neoplasm of skin of lower limb, including hip, left     Plan:  Patient was evaluated and treated and all questions answered.  Left submetatarsal 1/5 porokeratosis with underlying capsulitis of the first MPJ -I explained the patient the etiology of porokeratosis emergency room and options were extensively discussed.  I will hold off on steroid injection as that did not give her as much relief.  She would like to just have the lesion debrided down. -Offloading pads were dispensed.   No follow-ups on file.

## 2021-06-26 ENCOUNTER — Ambulatory Visit (INDEPENDENT_AMBULATORY_CARE_PROVIDER_SITE_OTHER): Payer: Medicare HMO

## 2021-06-26 DIAGNOSIS — R55 Syncope and collapse: Secondary | ICD-10-CM | POA: Diagnosis not present

## 2021-06-26 LAB — CUP PACEART REMOTE DEVICE CHECK
Date Time Interrogation Session: 20220711015743
Implantable Pulse Generator Implant Date: 20190405

## 2021-06-26 IMAGING — CT CT ABDOMEN AND PELVIS WITHOUT CONTRAST
2 of 4 series · 17 of 46 positions shown, 19 images · non-contrast
Comparison: CT scan dated 04/16/2019

CLINICAL DATA: Acute abdominal pain. Surgery for bowel obstruction
and adhesions on 04/16/2019.

EXAM:
CT ABDOMEN AND PELVIS WITHOUT CONTRAST
TECHNIQUE: Multidetector CT imaging of the abdomen and pelvis was performed
following the standard protocol without IV contrast.

[Series 2: routine abd/(person_name) (person_name) · axial · 0.77mm/px · z∈[+266,+631]mm · 14 of 81 slices shown, 16 images]
[im 4/81  soft-tissue]
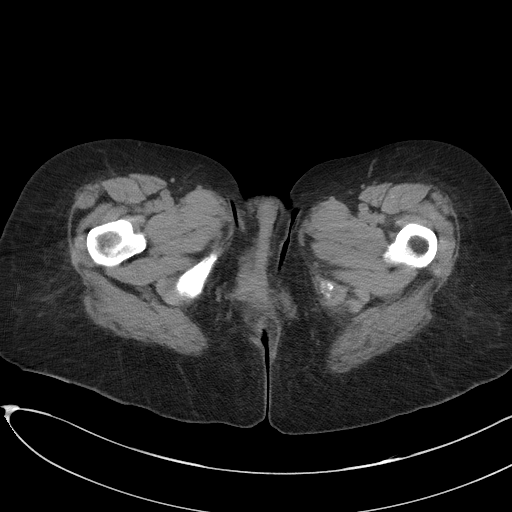
[im 4/81  bone]
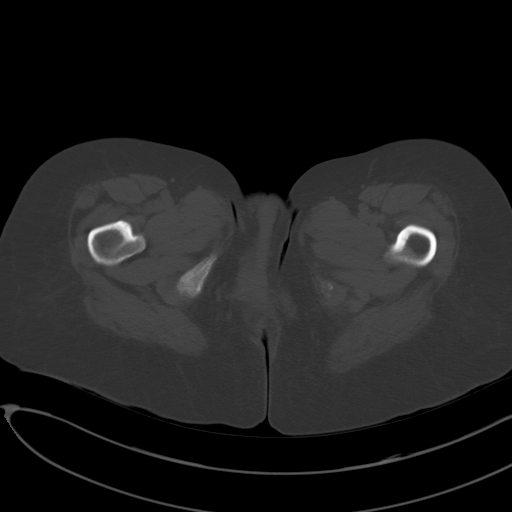
[im 11/81  soft-tissue]
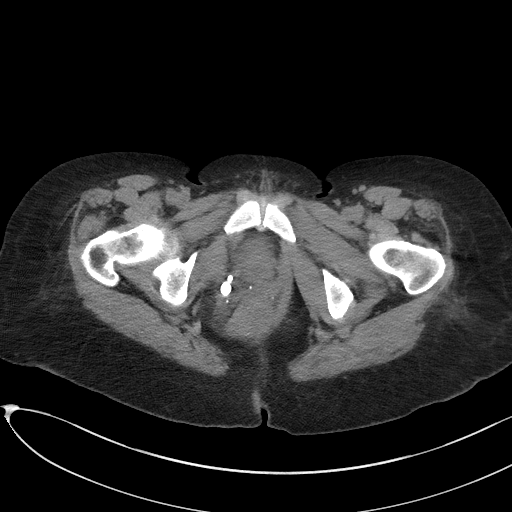
[im 14/81  soft-tissue]
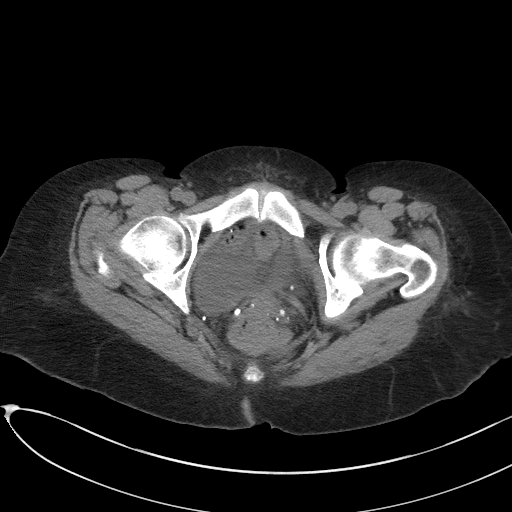
[im 21/81  soft-tissue]
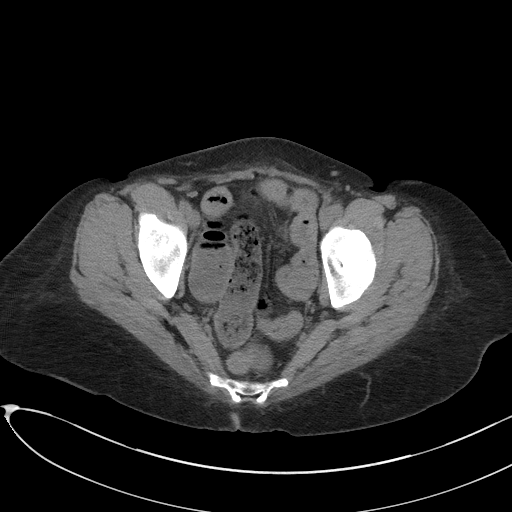
[im 28/81  soft-tissue]
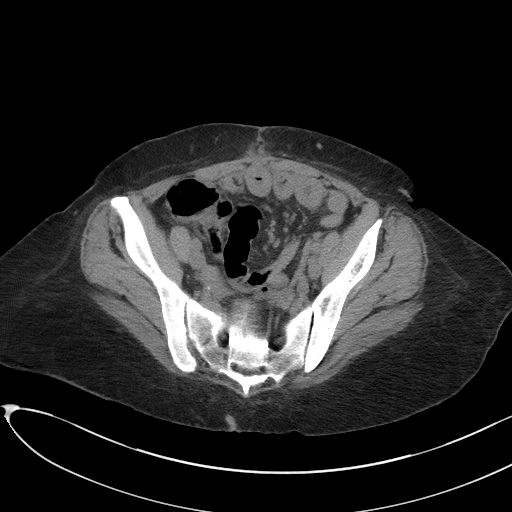
[im 32/81  soft-tissue]
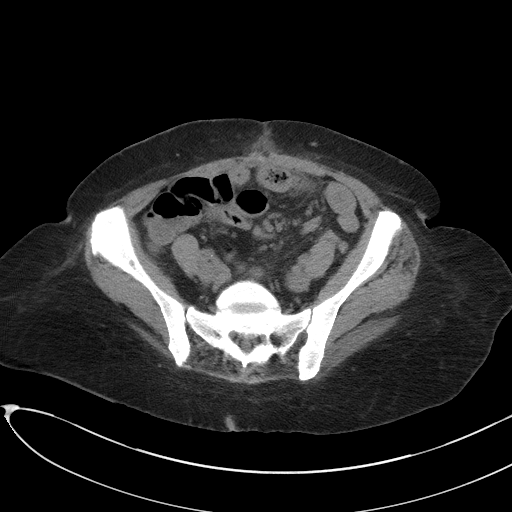
[im 39/81  soft-tissue]
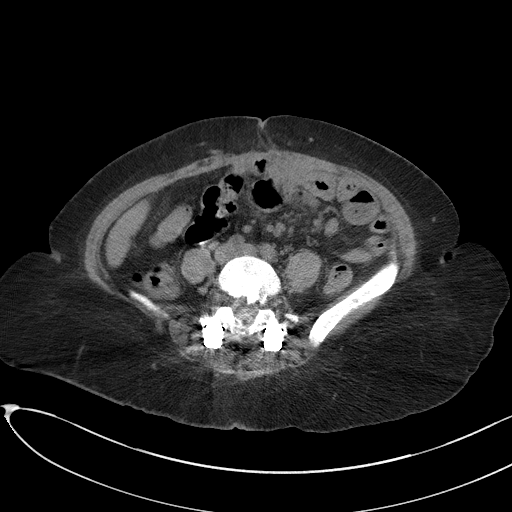
[im 42/81  soft-tissue]
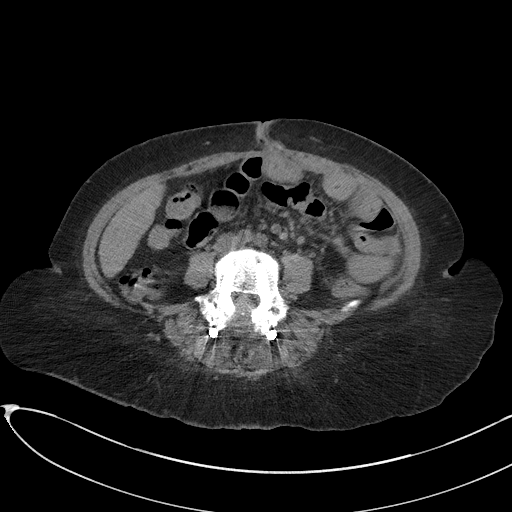
[im 49/81  soft-tissue]
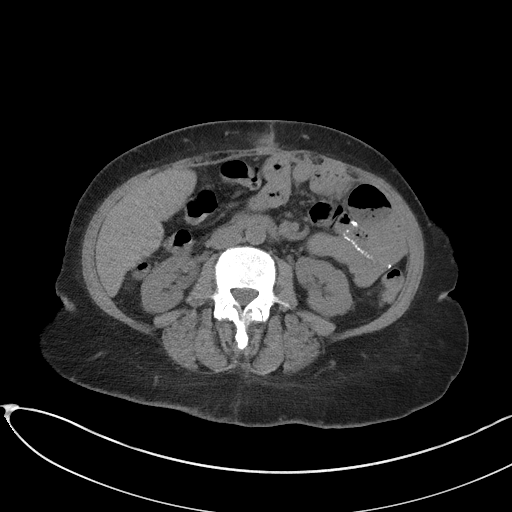
[im 49/81  bone]
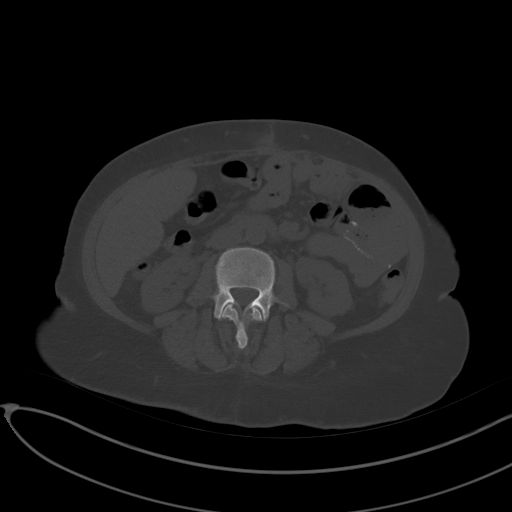
[im 53/81  soft-tissue]
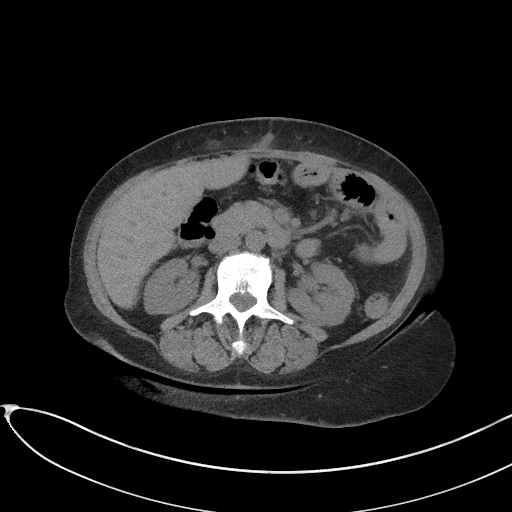
[im 60/81  soft-tissue]
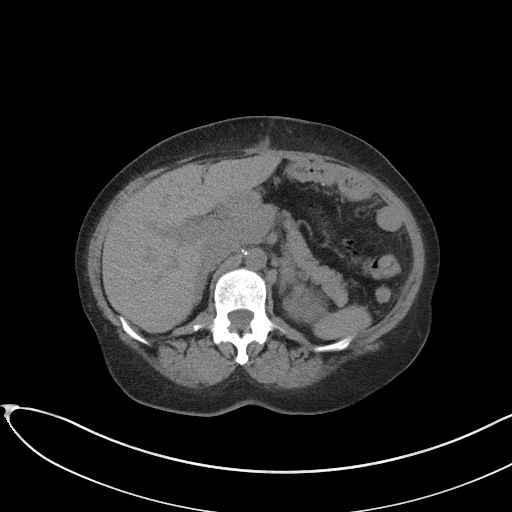
[im 67/81  soft-tissue]
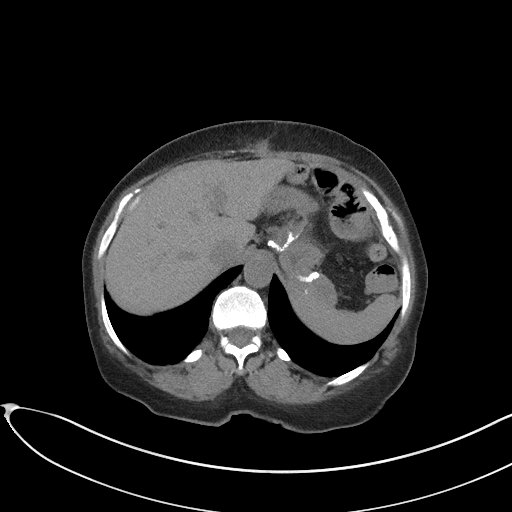
[im 70/81  soft-tissue]
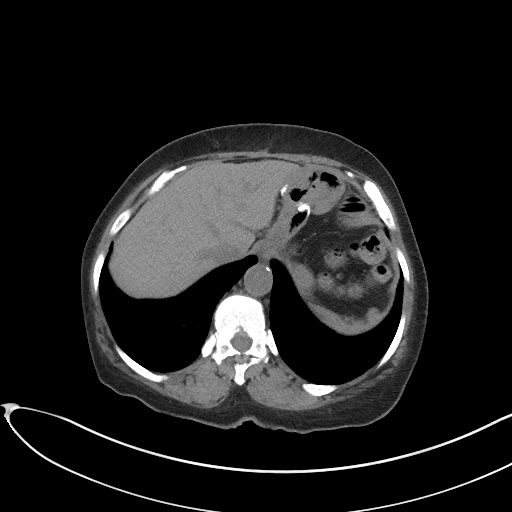
[im 77/81  soft-tissue]
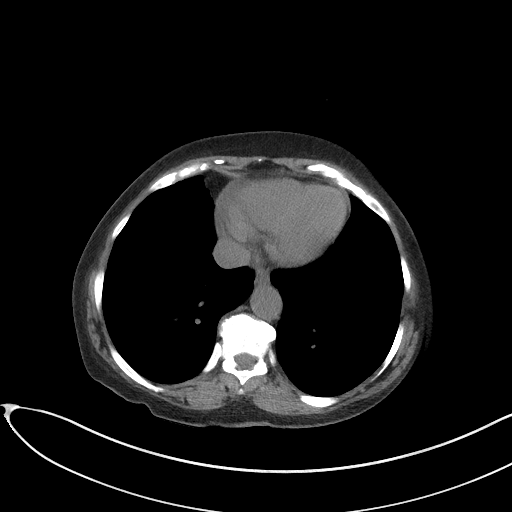

[Series 5: coronal st · coronal · 0.75mm/px · 3 of 80 slices shown]
[im 27/80  soft-tissue]
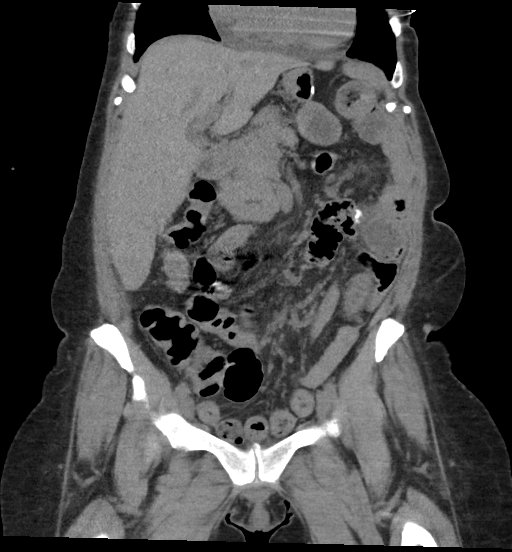
[im 36/80  soft-tissue]
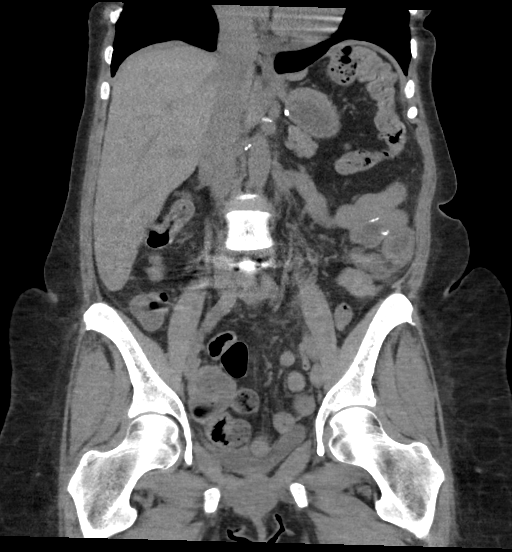
[im 44/80  soft-tissue]
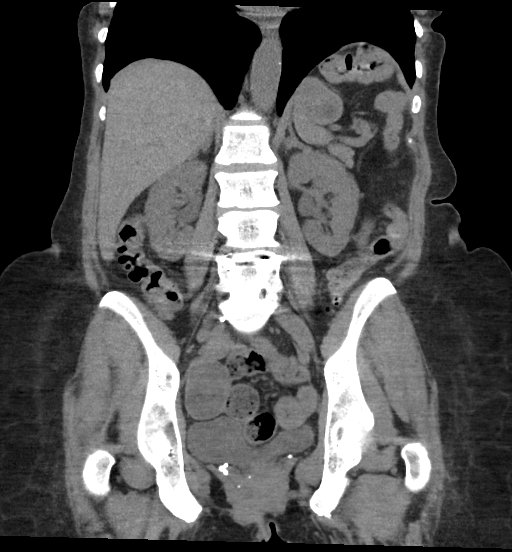

[17 of 46 positions shown; findings below may reference images not displayed]

FINDINGS: Lower chest: Normal.

Hepatobiliary: No focal liver abnormality is seen. Status post
cholecystectomy. No biliary dilatation.

Pancreas: Unremarkable. No pancreatic ductal dilatation or
surrounding inflammatory changes.

Spleen: Normal in size without focal abnormality.

Adrenals/Urinary Tract: Adrenal glands are unremarkable. Kidneys are
normal, without renal calculi, focal lesion, or hydronephrosis.
Bladder is unremarkable.

Stomach/Bowel: Roux-en-Y gastric bypass. The stomach is otherwise
normal. There are few scattered diverticula in the nondistended
colon.

There is a single minimally distended small bowel loop in the right
side of the pelvis, not felt to be significant.

Vascular/Lymphatic: Slight aortic atherosclerosis. No enlarged
abdominal or pelvic lymph nodes.

Reproductive: Status post hysterectomy. No adnexal masses.

Other: No ascites. Midline anterior abdominal wall incision noted.
No complicating features.

Musculoskeletal: No acute abnormalities. Previous L4-5 lumbar
fusion.
IMPRESSION: No acute abnormalities.  The colon is no longer distended.

## 2021-06-28 ENCOUNTER — Other Ambulatory Visit
Admission: RE | Admit: 2021-06-28 | Discharge: 2021-06-28 | Disposition: A | Discharge status: Home / Self Care | Payer: Medicare HMO | Source: Ambulatory Visit | Attending: Ophthalmology | Admitting: Ophthalmology

## 2021-06-28 ENCOUNTER — Other Ambulatory Visit: Payer: Self-pay

## 2021-06-28 DIAGNOSIS — H5789 Other specified disorders of eye and adnexa: Secondary | ICD-10-CM | POA: Insufficient documentation

## 2021-06-28 LAB — TSH: TSH: 1.896 u[IU]/mL (ref 0.350–4.500)

## 2021-06-28 LAB — T4, FREE: Free T4: 0.91 ng/dL (ref 0.61–1.12)

## 2021-06-28 NOTE — Patient Instructions (Signed)
Visit Information   PATIENT GOALS:   Goals Addressed               This Visit's Progress     Patient Stated (pt-stated)        Aging Gracefully Rn:   Patient states she would like to have decreased pain in back.  Assessment:  06/01/2021   Noted patient in severe back pain when sitting a walking.  Observed patient taking medications for pain. Observed patient having to stop walking until spasms stop.   Intervention:  reviewed with patient the importance of taking all medications as prescribed. Reviewed importance of talking to MD about other pain control options.   Plan: next home visit on 06/28/2021  06/28/2021 Assessment:  Reassessed pain. Continues to have significant pain.  Interventions: reviewed home exercise plan. Demonstrated all exercises and encouraged patient to do home exercises daily..  Reviewed importance of continuing to take all her medications as prescribed.  Plan : next home visit planned for 07/26/2021 at 1030 am.  Wrote appointment on Exercise packet.   [name], RN, BSN, CEN [place] Coordinator [phone]        Patient Stated        Aging Gracefully RN Goal  Patient states that she is worried about continued failing memory.  Assessment: Per patient new diagnosis of dementia. Reviewed concerns. Patient tearful of thought that her kids and grandkids will have to take care of her. Worries she will be a burden.   Interventions:  Encouraged patient to write things down. Encouraged patient to discuss her wishes with her husband and children. Reviewed with patient about finding a support group and learning of other ways to help with memory.  Reviewed pending follow up with MD.  PLAN: next home visit planned for 06/28/2021.  [name], RN, BSN, CEN [place] Coordinator [phone]   06/28/2021 Assessment:  reviewed with client how she felt about her memory today. Reports it is about the same.  States she is making notes and list  to remind her of things that she needs to remember. Reports that this technique is working well. Patient is considering starting to journal.  Interventions:  Encouraged patient to continue to makes notes and list. Encouraged patient to talk to family about her concerns.  Plan: next home visit is planned for 07/26/2021  [name], RN, BSN, [employer] Aging Gracefully RN [phone]

## 2021-06-28 NOTE — Patient Outreach (Signed)
Aging Gracefully Program  RN Visit  06/28/2021  Julie Acevedo 26-Sep-1951 [id number]  Visit:   RN home visit #2  Start Time:  1200 End Time:  100 Total Minutes:   60  Readiness To Change Score:     Universal RN Interventions: Calendar Distribution: Yes Exercise Review: Yes Medications: Yes Medication Changes: Yes Mood: Yes Pain: Yes PCP Advocacy/Support: Yes Fall Prevention: Yes Incontinence: Yes Clinician View Of Client Situation: Covid screening negative.Home neat and clean.  Patient grimanced with pain while demonstrating home exercise plan.  Appears happy today. Client View Of His/Her Situation: Patient reports to me that she had to go to the [facility] department for throat swelling about 2 weeks ago. Reports she had difficulty talking and swallowing.  States MD thinks it is her reflux. Reports she is better now. Pending GI follow up and endooscopy.  Healthcare Provider Communication:none    Public affairs consultant of Client Situation: Public affairs consultant Of Client Situation: Covid screening negative.Home neat and clean.  Patient grimanced with pain while demonstrating home exercise plan.  Appears happy today. Client's View of His/Her Situation: Client View Of His/Her Situation: Patient reports to me that she had to go to the [facility] department for throat swelling about 2 weeks ago. Reports she had difficulty talking and swallowing.  States MD thinks it is her reflux. Reports she is better now. Pending GI follow up and endooscopy.  Medication Assessment: Outpatient Encounter Medications as of 06/28/2021  Medication Sig   cyclobenzaprine (FLEXERIL) 5 MG tablet TAKE 1 TABLET BY MOUTH THREE TIMES A DAY AS NEEDED   gabapentin (NEURONTIN) 100 MG capsule TAKE 2 CAPSULES (200 MG TOTAL) BY MOUTH 3 (THREE) TIMES DAILY. (Patient taking differently: Take 200 mg by mouth 2 (two) times daily.)   hydrochlorothiazide (HYDRODIURIL) 25 MG tablet Take 1 tablet (25 mg total) by mouth daily. (Patient taking  differently: Take 12.5 mg by mouth daily.)   LINZESS 290 MCG CAPS capsule TAKE 1 CAPSULE (290 MCG TOTAL) BY MOUTH DAILY BEFORE BREAKFAST.   metFORMIN (GLUCOPHAGE) 500 MG tablet TAKE 1 TABLET (500 MG TOTAL) BY MOUTH 2 (TWO) TIMES DAILY WITH A MEAL. (Patient taking differently: Take 500 mg by mouth daily with breakfast.)   methocarbamol (ROBAXIN) 500 MG tablet Take 500 mg by mouth 2 (two) times daily. Back pain   metoprolol succinate (TOPROL-XL) 25 MG 24 hr tablet Take 1 tablet (25 mg total) by mouth daily.   ondansetron (ZOFRAN) 4 MG tablet Take 1 tablet (4 mg total) by mouth every 8 (eight) hours as needed.   pantoprazole (PROTONIX) 40 MG tablet Take 1 tablet (40 mg total) by mouth daily.   polyethylene glycol powder (GLYCOLAX/MIRALAX) 17 GM/SCOOP powder Take 17 g by mouth daily as needed for moderate constipation.   potassium chloride (KLOR-CON) 10 MEQ tablet Take 1 tablet (10 mEq total) by mouth daily.   traMADol (ULTRAM) 50 MG tablet Take 1 tablet (50 mg total) by mouth every 6 (six) hours as needed.   traZODone (DESYREL) 50 MG tablet TAKE 1 TABLET (50 MG TOTAL) BY MOUTH AT BEDTIME AS NEEDED.   amoxicillin (AMOXIL) 500 MG capsule Take 1 capsule (500 mg total) by mouth 3 (three) times daily. (Patient not taking: Reported on 06/28/2021)   lidocaine (XYLOCAINE) 2 % solution Use as directed 15 mLs in the mouth or throat as needed for mouth pain. (Patient not taking: Reported on 06/28/2021)   Prasterone (INTRAROSA) 6.5 MG INST Place 6.5 mg vaginally at bedtime. (Patient not taking: No sig  reported)   Vaginal Lubricant (REPLENS) GEL Place 1 Applicatorful vaginally 3 (three) times a week. (Patient not taking: Reported on 06/28/2021)   [DISCONTINUED] diphenhydrAMINE (BENADRYL) 50 MG tablet Patient is to take one tablet 1 hour before test. (Patient not taking: Reported on 04/28/2020)   No facility-administered encounter medications on file as of 06/28/2021.       OT Update: pending community housing  assessment  Session Summary: Sweet lady with new onset of memory deficits. Chronic pain. Now with eye concerns.  Reviewed ways to help with memory. Reviewed home exercise plan with hope to decrease back pain.  Encouraged patient to call GI MD.   Goals Addressed               This Visit's Progress     Patient Stated (pt-stated)        Aging Gracefully Rn:   Patient states she would like to have decreased pain in back.  Assessment:  06/01/2021   Noted patient in severe back pain when sitting a walking.  Observed patient taking medications for pain. Observed patient having to stop walking until spasms stop.   Intervention:  reviewed with patient the importance of taking all medications as prescribed. Reviewed importance of talking to MD about other pain control options.   Plan: next home visit on 06/28/2021  06/28/2021 Assessment:  Reassessed pain. Continues to have significant pain.  Interventions: reviewed home exercise plan. Demonstrated all exercises and encouraged patient to do home exercises daily..  Reviewed importance of continuing to take all her medications as prescribed.  Plan : next home visit planned for 07/26/2021 at 1030 am.  Wrote appointment on Exercise packet.   Julie Rand, RN, BSN, CEN [place] Coordinator [phone]        Patient Stated        Aging Gracefully RN Goal  Patient states that she is worried about continued failing memory.  Assessment: Per patient new diagnosis of dementia. Reviewed concerns. Patient tearful of thought that her kids and grandkids will have to take care of her. Worries she will be a burden.   Interventions:  Encouraged patient to write things down. Encouraged patient to discuss her wishes with her husband and children. Reviewed with patient about finding a support group and learning of other ways to help with memory.  Reviewed pending follow up with MD.  PLAN: next home visit planned for 06/28/2021.  Julie Rand,  RN, BSN, CEN [place] Coordinator [phone]   06/28/2021 Assessment:  reviewed with client how she felt about her memory today. Reports it is about the same.  States she is making notes and list to remind her of things that she needs to remember. Reports that this technique is working well. Patient is considering starting to journal.  Interventions:  Encouraged patient to continue to makes notes and list. Encouraged patient to talk to family about her concerns.  Plan: next home visit is planned for 07/26/2021  Julie Rand, RN, BSN, CEN Aging Gracefully RN [phone]         Julie Rand, RN, BSN, CEN Aging Gracefully RN [phone]

## 2021-06-30 ENCOUNTER — Other Ambulatory Visit: Payer: Self-pay | Admitting: Family Medicine

## 2021-06-30 NOTE — Telephone Encounter (Signed)
Future visit in 2 months  

## 2021-07-06 ENCOUNTER — Ambulatory Visit: Payer: Medicare HMO | Admitting: Family Medicine

## 2021-07-06 DIAGNOSIS — E119 Type 2 diabetes mellitus without complications: Secondary | ICD-10-CM | POA: Diagnosis not present

## 2021-07-06 DIAGNOSIS — E785 Hyperlipidemia, unspecified: Secondary | ICD-10-CM | POA: Diagnosis not present

## 2021-07-06 DIAGNOSIS — E876 Hypokalemia: Secondary | ICD-10-CM | POA: Diagnosis not present

## 2021-07-06 DIAGNOSIS — D631 Anemia in chronic kidney disease: Secondary | ICD-10-CM | POA: Diagnosis not present

## 2021-07-06 DIAGNOSIS — N1832 Chronic kidney disease, stage 3b: Secondary | ICD-10-CM | POA: Diagnosis not present

## 2021-07-06 DIAGNOSIS — D509 Iron deficiency anemia, unspecified: Secondary | ICD-10-CM | POA: Diagnosis not present

## 2021-07-06 DIAGNOSIS — R809 Proteinuria, unspecified: Secondary | ICD-10-CM | POA: Diagnosis not present

## 2021-07-06 DIAGNOSIS — I10 Essential (primary) hypertension: Secondary | ICD-10-CM | POA: Diagnosis not present

## 2021-07-16 ENCOUNTER — Other Ambulatory Visit: Payer: Self-pay | Admitting: Family Medicine

## 2021-07-16 DIAGNOSIS — K219 Gastro-esophageal reflux disease without esophagitis: Secondary | ICD-10-CM

## 2021-07-16 NOTE — Telephone Encounter (Signed)
dc'd 06/20/21 Change in therapy Ashok Cordia PA-C

## 2021-07-18 NOTE — Progress Notes (Signed)
[facility] Summary Report / Loop Recorder 

## 2021-07-20 ENCOUNTER — Ambulatory Visit: Payer: Self-pay | Admitting: *Deleted

## 2021-07-20 DIAGNOSIS — H5789 Other specified disorders of eye and adnexa: Secondary | ICD-10-CM | POA: Diagnosis not present

## 2021-07-20 NOTE — Telephone Encounter (Signed)
Dry lips, inside of mouth feels raw, white patches at back of mouth/tongue. Tongue felt swollen yesterday but not today. Recent ST but not lately.No difficulty swallowing,no drooling, denies difficulty breathing. These symptoms began one to two months ago.No fever. Completed abx one month ago.Water intake is >4 cups daily, voids several times daily and without difficulty, LBM yesterday-mildly loose.Occasionally feels dizzy when she goes from sitting to standing. B/P low at kidney OV last week 86/66 and 93/60. None to report today. 2 drugs patient taking not on MAR-Rivastagmine and Calcitriol.Reviewed Care advice with patient. No availability with pcp-scheduled with NP for 07/21/21. Reviewed symptoms patient would need to call 911 if she noticed. Patient verbalized understanding conversation.   Reason for Disposition  Dry mouth is a chronic symptom (recurrent or ongoing AND present > 4 weeks)  Answer Assessment - Initial Assessment Questions 1. SYMPTOM: "What's the main symptom you're concerned about?" (e.g., chapped lips, dry mouth, lump, sores)     Chapped lips, mouth feels raw, cracks in corners of mouth 2. ONSET: "When did the  mouth soreness  start?"     About one month ago but worsening 3. PAIN: "Is there any pain?" If Yes, ask: "How bad is it?" (Scale: 1-10; mild, moderate, severe)   - MILD (1-3):  doesn't interfere with eating or normal activities   - MODERATE (4-7): interferes with eating some solids and normal activities   - SEVERE (8-10):  excruciating pain, interferes with most normal activities   - SEVERE DYSPHAGIA: can't swallow liquids, drooling     Moderate but can eat and drink 4. CAUSE: "What do you think is causing the symptoms?"     unsure 5. OTHER SYMPTOMS: "Do you have any other symptoms?" (e.g., fever, sore throat, toothache, swelling)     Recent sore throat but no longer, tongue felt swollen yesterday  6. PREGNANCY: "Is there any chance you are pregnant?" "When was your last  menstrual period?"     na  Protocols used: Mouth Symptoms-A-AH

## 2021-07-21 ENCOUNTER — Ambulatory Visit (INDEPENDENT_AMBULATORY_CARE_PROVIDER_SITE_OTHER): Payer: Medicare HMO | Admitting: Family Medicine

## 2021-07-21 ENCOUNTER — Other Ambulatory Visit: Payer: Self-pay

## 2021-07-21 ENCOUNTER — Encounter: Payer: Self-pay | Admitting: Family Medicine

## 2021-07-21 VITALS — BP 100/62 | HR 63 | Temp 98.5°F | Wt 130.0 lb

## 2021-07-21 DIAGNOSIS — K1230 Oral mucositis (ulcerative), unspecified: Secondary | ICD-10-CM | POA: Diagnosis not present

## 2021-07-21 DIAGNOSIS — K121 Other forms of stomatitis: Secondary | ICD-10-CM | POA: Diagnosis not present

## 2021-07-21 DIAGNOSIS — I10 Essential (primary) hypertension: Secondary | ICD-10-CM | POA: Diagnosis not present

## 2021-07-21 DIAGNOSIS — K130 Diseases of lips: Secondary | ICD-10-CM | POA: Insufficient documentation

## 2021-07-21 DIAGNOSIS — E538 Deficiency of other specified B group vitamins: Secondary | ICD-10-CM

## 2021-07-21 DIAGNOSIS — N1832 Chronic kidney disease, stage 3b: Secondary | ICD-10-CM | POA: Diagnosis not present

## 2021-07-21 DIAGNOSIS — E1122 Type 2 diabetes mellitus with diabetic chronic kidney disease: Secondary | ICD-10-CM

## 2021-07-21 DIAGNOSIS — K219 Gastro-esophageal reflux disease without esophagitis: Secondary | ICD-10-CM

## 2021-07-21 MED ORDER — LIDOCAINE VISCOUS HCL 2 % MT SOLN: 5.0000 mL | Freq: Four times a day (QID) | OROMUCOSAL | 0 refills | Status: DC | PRN

## 2021-07-21 MED ORDER — TRIAMCINOLONE ACETONIDE 0.5 % EX OINT: 1.0000 "application " | TOPICAL_OINTMENT | Freq: Two times a day (BID) | CUTANEOUS | 0 refills | Status: DC

## 2021-07-21 NOTE — Assessment & Plan Note (Signed)
Well controlled.  No medication changes.  Continue medications.

## 2021-07-21 NOTE — Assessment & Plan Note (Signed)
Stable per pt report  No signs of yeast/candida in oral cavity  Continue mangagment

## 2021-07-21 NOTE — Assessment & Plan Note (Signed)
Pt feels as if her tongue is swollen  Mouth is free from erythema, active lesions.  Magic mouthwash ordered; continue bland foods  Pt made aware of red flags- drooling, cough, change in speech pattern, shortness of breath and need for emergent treatment

## 2021-07-21 NOTE — Assessment & Plan Note (Signed)
Bilateral lip crease; L>R  Pt has tried lip balm with minimal effect  Kenalog order; advised sparing use

## 2021-07-21 NOTE — Progress Notes (Signed)
Established patient visit   Patient: Julie Acevedo   DOB: 10-13-1951   [name]  MRN: [id number] Visit Date: 07/21/2021  Today's healthcare provider: Gwyneth Sprout, FNP   Chief Complaint  Patient presents with   Jaw Pain   Mouth Lesions   Subjective    Oral Pain  This is a new problem. The current episode started 1 to 4 weeks ago. The problem has been unchanged. Associated symptoms include thermal sensitivity. Pertinent negatives include no difficulty swallowing, facial pain, fever, oral bleeding or sinus pressure. She has tried nothing for the symptoms.     Previously seen in ER 06/19/21 and was treated.  Described how her tongue no longer can feel all the parts of her palate and that her tongue feels coarser when brushing.  Oral pain with alcohol mouth wash-advised change of formula to alcohol free version.   Medications: Outpatient Medications Prior to Visit  Medication Sig   cyclobenzaprine (FLEXERIL) 5 MG tablet TAKE 1 TABLET BY MOUTH THREE TIMES A DAY AS NEEDED   gabapentin (NEURONTIN) 100 MG capsule TAKE 2 CAPSULES (200 MG TOTAL) BY MOUTH 3 (THREE) TIMES DAILY. (Patient taking differently: Take 200 mg by mouth 2 (two) times daily.)   hydrochlorothiazide (HYDRODIURIL) 25 MG tablet Take 1 tablet (25 mg total) by mouth daily. (Patient taking differently: Take 12.5 mg by mouth daily.)   LINZESS 290 MCG CAPS capsule TAKE 1 CAPSULE BY MOUTH DAILY BEFORE BREAKFAST.   metFORMIN (GLUCOPHAGE) 500 MG tablet TAKE 1 TABLET (500 MG TOTAL) BY MOUTH 2 (TWO) TIMES DAILY WITH A MEAL. (Patient taking differently: Take 500 mg by mouth daily with breakfast.)   methocarbamol (ROBAXIN) 500 MG tablet Take 500 mg by mouth 2 (two) times daily. Back pain   metoprolol succinate (TOPROL-XL) 25 MG 24 hr tablet Take 1 tablet (25 mg total) by mouth daily.   ondansetron (ZOFRAN) 4 MG tablet Take 1 tablet (4 mg total) by mouth every 8 (eight) hours as needed.   pantoprazole (PROTONIX) 40 MG tablet  Take 1 tablet (40 mg total) by mouth daily.   polyethylene glycol powder (GLYCOLAX/MIRALAX) 17 GM/SCOOP powder Take 17 g by mouth daily as needed for moderate constipation.   potassium chloride (KLOR-CON) 10 MEQ tablet Take 1 tablet (10 mEq total) by mouth daily.   traMADol (ULTRAM) 50 MG tablet Take 1 tablet (50 mg total) by mouth every 6 (six) hours as needed.   traZODone (DESYREL) 50 MG tablet TAKE 1 TABLET (50 MG TOTAL) BY MOUTH AT BEDTIME AS NEEDED.   Vaginal Lubricant (REPLENS) GEL Place 1 Applicatorful vaginally 3 (three) times a week.   [DISCONTINUED] amoxicillin (AMOXIL) 500 MG capsule Take 1 capsule (500 mg total) by mouth 3 (three) times daily.   [DISCONTINUED] diphenhydrAMINE (BENADRYL) 50 MG tablet Patient is to take one tablet 1 hour before test.   [DISCONTINUED] lidocaine (XYLOCAINE) 2 % solution Use as directed 15 mLs in the mouth or throat as needed for mouth pain.   [DISCONTINUED] Prasterone (INTRAROSA) 6.5 MG INST Place 6.5 mg vaginally at bedtime.   No facility-administered medications prior to visit.    Review of Systems  Constitutional: Negative.  Negative for fever.  HENT:  Positive for mouth sores and sore throat. Negative for congestion, ear discharge, ear pain, hearing loss, postnasal drip, rhinorrhea, sinus pressure, sinus pain, tinnitus, trouble swallowing and voice change.        Pt reports her tongue "felt swollen" 07/19/2021, but is better  today.   Eyes: Negative.   Respiratory: Negative.    Neurological:  Negative for dizziness, light-headedness and headaches.      Objective    BP 100/62 (BP Location: Left Arm, Patient Position: Sitting, Cuff Size: Large)   Pulse 63   Temp 98.5 F (36.9 C) (Oral)   Wt 130 lb (59 kg)   SpO2 100%   BMI 23.03 kg/m     Physical Exam Vitals and nursing note reviewed.  Constitutional:      Appearance: Normal appearance. She is normal weight.  HENT:     Nose: Nose normal.     Mouth/Throat:     Lips: Pink. No  lesions.     Mouth: Mucous membranes are moist. No injury, lacerations, oral lesions or angioedema.     Dentition: Does not have dentures. No gingival swelling or gum lesions.     Tongue: No lesions. Tongue does not deviate from midline.     Palate: No mass and lesions.     Pharynx: Uvula midline. No pharyngeal swelling, oropharyngeal exudate, posterior oropharyngeal erythema or uvula swelling.     Tonsils: 0 on the right. 0 on the left.      Comments: 85m x 265mwhite macule on L lip crease  No evidence of burns, leukoedema, striae, or leukoplakia.  Possible keratosis at site of friction- encouraged PO intake and application of small amount of steroid cream. Cardiovascular:     Rate and Rhythm: Normal rate and regular rhythm.     Pulses: Normal pulses.     Heart sounds: Normal heart sounds. No murmur heard.   No friction rub. No gallop.     Comments: Has PPM Abdominal:     General: Bowel sounds are normal.     Palpations: Abdomen is soft.  Musculoskeletal:     Cervical back: Normal range of motion and neck supple.  Skin:    General: Skin is warm and dry.     Capillary Refill: Capillary refill takes 2 to 3 seconds.  Neurological:     General: No focal deficit present.     Mental Status: She is alert and oriented to person, place, and time.  Psychiatric:        Mood and Affect: Mood normal.        Behavior: Behavior normal.        Thought Content: Thought content normal.        Judgment: Judgment normal.     No results found for any visits on 07/21/21.  Assessment & Plan     Problem List Items Addressed This Visit       Cardiovascular and Mediastinum   Benign essential HTN (Chronic)    Well controlled.  No medication changes.  Continue medications.         Digestive   Gastroesophageal reflux disease    Chronic  Has not noticed much improvement since change in PPI  Has appt with GI next month  Not eating acidic food/drink now d/t mouth pain- misses  coffee       Relevant Medications   magic mouthwash (lidocaine, diphenhydrAMINE, alum & mag hydroxide) suspension   Cheilitis    Bilateral lip crease; L>R  Pt has tried lip balm with minimal effect  Kenalog order; advised sparing use       Relevant Medications   triamcinolone ointment (KENALOG) 0.5 %     Endocrine   DM2 (diabetes mellitus, type 2) (HCC) (Chronic)    Stable per pt report  No signs of yeast/candida in oral cavity  Continue mangagment         Other   B12 deficiency    Chronic condition  Pt is s/p Gastric bypass; 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  Magic mouthwash ordered; continue bland foods  Pt made aware of red flags- drooling, cough, change in speech pattern, shortness of breath and need for emergent treatment       Relevant Medications   magic mouthwash (lidocaine, diphenhydrAMINE, alum & mag hydroxide) suspension     Return if symptoms worsen or fail to improve.     Vonna Kotyk, FNP, have reviewed all documentation for this visit. The documentation on 07/21/21 for the exam, diagnosis, procedures, and orders are all accurate and complete.    Gwyneth Sprout, [place] [phone] (phone) [phone] (fax)  [place]

## 2021-07-21 NOTE — Assessment & Plan Note (Signed)
Chronic  Has not noticed much improvement since change in PPI  Has appt with GI next month  Not eating acidic food/drink now d/t mouth pain- misses coffee

## 2021-07-21 NOTE — Assessment & Plan Note (Signed)
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

## 2021-07-24 ENCOUNTER — Other Ambulatory Visit: Payer: Self-pay

## 2021-07-24 NOTE — Patient Outreach (Signed)
Aging Gracefully Program  RN Visit  07/24/2021  Julie Acevedo 1950-12-19 [id number]  Visit:   RN home visit #3  Start Time:   1030 End Time:   1130 Total Minutes:   60  Readiness To Change Score:     Universal RN Interventions: Calendar Distribution: Yes Exercise Review: Yes Medications: Yes Medication Changes: Yes Pain: Yes PCP Advocacy/Support: No Fall Prevention: Yes Incontinence: Yes Clinician View Of Client Situation: Covid negative screening   Home neat and clean. Client sweeping carport upon my arrival.. ambulatory without problems.. No falls. Client View Of His/Her Situation: Pain in mouth and feels like tongue has been swollen. Saw provider last week. reports cracked skin around mouth. Denies any falls.  Healthcare Provider Communication: Did Higher education careers adviser With [employer] Provider?: No  Clinician View of Client Situation: Clinician View Of Client Situation: Covid negative screening   Home neat and clean. Client sweeping carport upon my arrival.. ambulatory without problems.. No falls. Client's View of His/Her Situation: Client View Of His/Her Situation: Pain in mouth and feels like tongue has been swollen. Saw provider last week. reports cracked skin around mouth. Denies any falls.  Medication Assessment:no changes    OT Update: home assessment completed. Pending contracts   Session Summary: Reports cracking on the corner of for mouth with continued sore throat. Saw PCP and suspect reflux. Reviewed GI doctor phone number and provided address.   Goals Addressed               This Visit's Progress     Patient Stated (pt-stated)        Aging Gracefully Rn:   Patient states she would like to have decreased pain in back.  Assessment:  06/01/2021   Noted patient in severe back pain when sitting a walking.  Observed patient taking medications for pain. Observed patient having to stop walking until spasms stop.   Intervention:  reviewed with patient the  importance of taking all medications as prescribed. Reviewed importance of talking to MD about other pain control options.   Plan: next home visit on 06/28/2021  06/28/2021 Assessment:  Reassessed pain. Continues to have significant pain.  Interventions: reviewed home exercise plan. Demonstrated all exercises and encouraged patient to do home exercises daily..  Reviewed importance of continuing to take all her medications as prescribed.  Plan : next home visit planned for 07/26/2021 at 1030 am.  Wrote appointment on Exercise packet.   [name], RN, BSN, CEN [place] Coordinator [phone]   07/24/2021 Assessment: No changes in back pain. Reports she is not able to do exercises.  Interventions:  Encourage patient to continue to stay active in any way possible. Reviewed importance of continuing to take her medications as prescribed.   Plan: next home visit planned for 09/04/2021.  [name], RN, BSN, CEN Aging Gracefully RN [phone]       Patient Stated        Aging Gracefully RN Goal  Patient states that she is worried about continued failing memory.  Assessment: Per patient new diagnosis of dementia. Reviewed concerns. Patient tearful of thought that her kids and grandkids will have to take care of her. Worries she will be a burden.   Interventions:  Encouraged patient to write things down. Encouraged patient to discuss her wishes with her husband and children. Reviewed with patient about finding a support group and learning of other ways to help with memory.  Reviewed pending follow up with MD.  PLAN: next home  visit planned for 06/28/2021.  [name], RN, BSN, CEN [place] Coordinator [phone]   06/28/2021 Assessment:  reviewed with client how she felt about her memory today. Reports it is about the same.  States she is making notes and list to remind her of things that she needs to remember. Reports that this technique is working well.  Patient is considering starting to journal.  Interventions:  Encouraged patient to continue to makes notes and list. Encouraged patient to talk to family about her concerns.  Plan: next home visit is planned for 07/26/2021  [name], RN, BSN, CEN Aging Gracefully RN [phone]   07/24/2021 Assessments:   reports her memory is about the same.  Reports she continues to keep a list and write things down. Reports she recently had to get her daughter to help her and she was embarrassed. Reports not she can not remember what it was. Not oing exercises.    PLAN: follow up in 1 month.  [name], RN, BSN, CEN Aging Gracefully RN [phone]  Interventions:  reviewed importance of continuing to make herself notes for reminders.            [name], RN, BSN, [employer] [phone]

## 2021-07-26 ENCOUNTER — Other Ambulatory Visit: Payer: Self-pay | Admitting: Family Medicine

## 2021-07-26 DIAGNOSIS — K130 Diseases of lips: Secondary | ICD-10-CM

## 2021-07-26 DIAGNOSIS — M545 Low back pain, unspecified: Secondary | ICD-10-CM

## 2021-07-26 DIAGNOSIS — M5417 Radiculopathy, lumbosacral region: Secondary | ICD-10-CM

## 2021-07-26 NOTE — Telephone Encounter (Signed)
Requested Prescriptions  Pending Prescriptions Disp Refills  . cyclobenzaprine (FLEXERIL) 5 MG tablet [Pharmacy Med Name: CYCLOBENZAPRINE 5 MG TABLET] 60 tablet 3    Sig: TAKE 1 TABLET BY MOUTH THREE TIMES A DAY AS NEEDED     Not Delegated - Analgesics:  Muscle Relaxants Failed - 07/26/2021  8:44 PM      Failed - This refill cannot be delegated      Passed - Valid encounter within last 6 months    Recent Outpatient Visits          5 days ago Stomatitis and mucositis   [facility] Tally Joe T, FNP   3 months ago Seasonal allergic rhinitis due to pollen   [facility] Rosanna Randy, Retia Passe., MD   4 months ago Diabetes mellitus due to underlying condition with diabetic nephropathy, without long-term current use of insulin [facility])   [facility] Jerrol Banana., MD   7 months ago Urinary symptom or sign   [place], Kelby Aline, FNP   7 months ago Cognitive impairment   [facility] Jerrol Banana., MD      Future Appointments            In 1 month Jerrol Banana., MD [facility]           . traMADol (ULTRAM) 50 MG tablet [Pharmacy Med Name: TRAMADOL HCL 50 MG TABLET] 100 tablet     Sig: TAKE 1 TABLET BY MOUTH EVERY 6 HOURS AS NEEDED.     Not Delegated - Analgesics:  Opioid Agonists Failed - 07/26/2021  8:44 PM      Failed - This refill cannot be delegated      Failed - Urine Drug Screen completed in last 360 days      Passed - Valid encounter within last 6 months    Recent Outpatient Visits          5 days ago Stomatitis and mucositis   [facility] Tally Joe T, FNP   3 months ago Seasonal allergic rhinitis due to pollen   [facility] Rosanna Randy, Retia Passe., MD   4 months ago Diabetes mellitus due to underlying condition with diabetic nephropathy, without long-term current use of insulin [facility])   [facility] Jerrol Banana., MD   7 months ago Urinary symptom or sign   [place], FNP   7 months ago Cognitive impairment   [facility] Jerrol Banana., MD      Future Appointments            In 1 month Jerrol Banana., MD [facility], [facility]           . traZODone (DESYREL) 50 MG tablet [Pharmacy Med Name: TRAZODONE 50 MG TABLET] 30 tablet 11    Sig: TAKE 1 TABLET (50 MG TOTAL) BY MOUTH AT BEDTIME AS NEEDED.     Psychiatry: Antidepressants - Serotonin Modulator Passed - 07/26/2021  8:44 PM      Passed - Valid encounter within last 6 months    Recent Outpatient Visits          5 days ago Stomatitis and mucositis   [facility] Tally Joe T, FNP   3 months ago Seasonal allergic rhinitis due to pollen   [facility] Jerrol Banana., MD   4 months ago  Diabetes mellitus due to underlying condition with diabetic nephropathy, without long-term current use of insulin [facility])   [facility] Jerrol Banana., MD   7 months ago Urinary symptom or sign   [place], FNP   7 months ago Cognitive impairment   [facility] Jerrol Banana., MD      Future Appointments            In 1 month Jerrol Banana., MD [facility] [place], [place]

## 2021-07-26 NOTE — Telephone Encounter (Signed)
Requested medications are due for refill today.  yes  Requested medications are on the active medications list.  yes  Last refill. 05/22/2021 &01/13/2021  Future visit scheduled.   yes  Notes to clinic.  Medications are not delegated.

## 2021-07-28 ENCOUNTER — Telehealth: Payer: Self-pay

## 2021-07-28 NOTE — Progress Notes (Signed)
Chronic Care Management Pharmacy Assistant   Name: SOFIYA ASHBRIDGE  MRN: [id number] DOB: November 21, 1951  Reason for Encounter: Diabetes Disease State Call   Recent office visits:  07/21/2021 Tally Joe FNP (PCP Office Visit) for Stomatitis and mucositis- started on magic mouthwash, and Triamcinolone Acetonide 0.5%; Discontinued: Amoxicilliam '500mg'$ , Diphenhydramine HCl 50 mg, Lidocaine HCl 2%, Prasterone 6.5 all due to patient not taking.  Recent consult visits:  07/06/2021 Lyla Son, MD (Nephrology) for follow-up- per note hypertension marginally low today. I advised her to decrease the hydrochlorothiazide to 12.5 mg daily and continue the metoprolol;  Patient is advised to avoid nonsteroidal anti-inflammatory drugs; patient instructed to return in 3 months.  06/20/2021 Boneta Lucks, DPM (Podiatry) for Callouses- No medication changes indicated; No follow-ups made  Hospital visits:  Medication Reconciliation was completed by comparing discharge summary, patient's EMR and Pharmacy list, and upon discussion with patient.  Admitted to the hospital on 06/19/2021 due to Esophagitis. Discharge date was 06/20/2021. Discharged from [facility] [place]?Medications Started at [facility] Discharge:?? -started  amoxicillin 500 MG Take 1 capsule (500 mg total) by mouth 3 (three) times daily. capsule  lidocaine 2 % solution Use as directed 15 mLs in the mouth or throat as needed for mouth pain. pantoprazole 40 MG tablet Take 1 tablet (40 mg total) by mouth daily.  Medication Changes at Hospital Discharge: -Changed None ID  Medications Discontinued at Hospital Discharge: -Stopped Omeprazole 20 mg  due to Change in therapy  Medications that remain the same after Hospital Discharge:??  -All other medications will remain the same.    Medications: Outpatient Encounter Medications as of 07/28/2021  Medication Sig   cyclobenzaprine (FLEXERIL) 5 MG  tablet TAKE 1 TABLET BY MOUTH THREE TIMES A DAY AS NEEDED   gabapentin (NEURONTIN) 100 MG capsule TAKE 2 CAPSULES (200 MG TOTAL) BY MOUTH 3 (THREE) TIMES DAILY. (Patient taking differently: Take 200 mg by mouth 2 (two) times daily.)   hydrochlorothiazide (HYDRODIURIL) 25 MG tablet Take 1 tablet (25 mg total) by mouth daily. (Patient taking differently: Take 12.5 mg by mouth daily.)   LINZESS 290 MCG CAPS capsule TAKE 1 CAPSULE BY MOUTH DAILY BEFORE BREAKFAST.   magic mouthwash (lidocaine, diphenhydrAMINE, alum & mag hydroxide) suspension Swish and swallow 5 mLs 4 (four) times daily as needed for mouth pain.   metFORMIN (GLUCOPHAGE) 500 MG tablet TAKE 1 TABLET (500 MG TOTAL) BY MOUTH 2 (TWO) TIMES DAILY WITH A MEAL. (Patient taking differently: Take 500 mg by mouth daily with breakfast.)   methocarbamol (ROBAXIN) 500 MG tablet Take 500 mg by mouth 2 (two) times daily. Back pain   metoprolol succinate (TOPROL-XL) 25 MG 24 hr tablet Take 1 tablet (25 mg total) by mouth daily.   ondansetron (ZOFRAN) 4 MG tablet Take 1 tablet (4 mg total) by mouth every 8 (eight) hours as needed.   pantoprazole (PROTONIX) 40 MG tablet Take 1 tablet (40 mg total) by mouth daily.   polyethylene glycol powder (GLYCOLAX/MIRALAX) 17 GM/SCOOP powder Take 17 g by mouth daily as needed for moderate constipation.   potassium chloride (KLOR-CON) 10 MEQ tablet Take 1 tablet (10 mEq total) by mouth daily.   traMADol (ULTRAM) 50 MG tablet Take 1 tablet (50 mg total) by mouth every 6 (six) hours as needed.   traZODone (DESYREL) 50 MG tablet TAKE 1 TABLET (50 MG TOTAL) BY MOUTH AT BEDTIME AS NEEDED.   triamcinolone ointment (KENALOG) 0.5 % Apply 1  application topically 2 (two) times daily.   Vaginal Lubricant (REPLENS) GEL Place 1 Applicatorful vaginally 3 (three) times a week.   No facility-administered encounter medications on file as of 07/28/2021.   Care Gaps: OPHTHALMOLOGY EXAM URINE MICROALBUMIN (last completed  06/23/2020) INFLUENZA VACCINE (last completed 09/01/2020)   Star Rating Drugs: Metformin 500 mg last filled on 04/06/2021 for a 90-Day supply with [facility]   Recent Relevant Labs: Lab Results  Component Value Date/Time   HGBA1C 5.5 03/06/2021 04:26 PM   HGBA1C 5.6 12/05/2020 08:55 AM   HGBA1C 6.3 01/18/2014 06:32 AM   MICROALBUR 20 06/23/2020 02:10 PM   MICROALBUR 50 11/18/2017 09:33 AM    Kidney Function Lab Results  Component Value Date/Time   CREATININE 1.57 (H) 06/19/2021 11:49 PM   CREATININE 1.41 (H) 06/02/2021 11:03 AM   CREATININE 1.35 (H) 10/30/2017 10:58 AM   CREATININE 1.15 (H) 08/21/2017 03:32 PM   GFRNONAA 36 (L) 06/19/2021 11:49 PM   GFRNONAA 41 (L) 10/30/2017 10:58 AM   GFRAA 42 (L) 12/05/2020 08:55 AM   GFRAA 48 (L) 10/30/2017 10:58 AM    Current antihyperglycemic regimen:  Metformin 500 mg 1 tablet twice daily  What recent interventions/DTPs have been made to improve glycemic control:  None ID  Have there been any recent hospitalizations or ED visits since last visit with CPP? Yes  Patient denies hypoglycemic symptoms, including Pale, Sweaty, Shaky, Hungry, Nervous/irritable, and Vision changes  Patient denies hyperglycemic symptoms, including blurry vision, excessive thirst, fatigue, polyuria, and weakness  How often are you checking your blood sugar? twice daily  What are your blood sugars ranging?  Before breakfast this morning it was 98 last night prior to bed after dinner it was 162  During the week, how often does your blood glucose drop below 70? Never  Are you checking your feet daily/regularly? Yes  Adherence Review: Is the patient currently on a STATIN medication? No Is the patient currently on ACE/ARB medication? No Does the patient have >5 day gap between last estimated fill dates?   Reviewed chart prior to disease state call. Spoke with patient regarding BP  Recent Office Vitals: BP Readings from Last 3 Encounters:   07/21/21 100/62  06/20/21 120/65  06/02/21 109/64   Pulse Readings from Last 3 Encounters:  07/21/21 63  06/20/21 81  06/02/21 63    Wt Readings from Last 3 Encounters:  07/21/21 130 lb (59 kg)  06/19/21 132 lb (59.9 kg)  06/02/21 131 lb 1.6 oz (59.5 kg)     Kidney Function Lab Results  Component Value Date/Time   CREATININE 1.57 (H) 06/19/2021 11:49 PM   CREATININE 1.41 (H) 06/02/2021 11:03 AM   CREATININE 1.35 (H) 10/30/2017 10:58 AM   CREATININE 1.15 (H) 08/21/2017 03:32 PM   GFRNONAA 36 (L) 06/19/2021 11:49 PM   GFRNONAA 41 (L) 10/30/2017 10:58 AM   GFRAA 42 (L) 12/05/2020 08:55 AM   GFRAA 48 (L) 10/30/2017 10:58 AM    BMP Latest Ref Rng & Units 06/19/2021 06/02/2021 04/18/2021  Glucose 70 - 99 mg/dL 85 73 86  BUN 8 - 23 mg/dL '22 19 19  '$ Creatinine 0.44 - 1.00 mg/dL 1.57(H) 1.41(H) 1.55(H)  BUN/Creat Ratio 12 - 28 - - -  Sodium 135 - 145 mmol/L 139 142 140  Potassium 3.5 - 5.1 mmol/L 3.6 3.3(L) 2.9(L)  Chloride 98 - 111 mmol/L 103 107 103  CO2 22 - 32 mmol/L '29 27 29  '$ Calcium 8.9 - 10.3 mg/dL 8.5(L) 9.1 8.9  Current antihypertensive regimen:  Hydrochlorothiazide 25.mg 1 tablet daily, but patient BP has been running low so she was instructed to take 1.2 of the pill Metoprolol Succinate 25 mg  How often are you checking your Blood Pressure? 3-5x per week  Current home BP readings: 100/69 & 89/60  What recent interventions/DTPs have been made by any provider to improve Blood Pressure control since last CPP Visit: Yes provider instructed patient to take HCTZ 12.5 instead of 25 daily. Patient also reports due to her Blood Pressure being so low lately she doesn't take her blood pressure medicine daily she monitors her blood pressure and if it is at a certain number of elevation she will take it. If it is low when she takes her blood pressure she will skip her medications in order to keep her blood pressure from falling lower.  Any recent hospitalizations or ED visits  since last visit with CPP? Yes  What diet changes have been made to improve Blood Pressure Control?  Patient stated she is not on any certain diet she just eats normally and try to watch her salt and sodium intake   What exercise is being done to improve your Blood Pressure Control?  Patient reports that she has back pain that limits her movements. Patient stated that she did fall on last Saturday. Patient reports she was out in the yard cleaning out her city trash [place] and got tangled up in the water hose and fell. Patient stated that she was down for a minute her husband found her and helped her get up. She denies hitting her head, stated that she is pretty bruised up she doesn't feel like anything is broken she is just sore. She also stated that she is unsure if she lost consciousness or not. She stated that today besides the soreness and her back pain she feels pretty good. FYI message sent to [name], CPP regarding the patient's fall.  Adherence Review: Is the patient currently on ACE/ARB medication? No Does the patient have >5 day gap between last estimated fill dates? No   Patient has upcoming appointment with [name], CPP on 09/18/2021 @ [place], CPA/CMA Catering manager Phone: [phone]

## 2021-07-31 ENCOUNTER — Other Ambulatory Visit: Payer: Self-pay

## 2021-07-31 DIAGNOSIS — M5417 Radiculopathy, lumbosacral region: Secondary | ICD-10-CM

## 2021-07-31 NOTE — Telephone Encounter (Signed)
Copied from [place] [phone]. Topic: General - Other >> Jul 31, 2021  4:11 PM Leward Quan A wrote: Reason for CRM: Patient called in to inquire of Dr Rosanna Randy why refill for traMADol (ULTRAM) 50 MG tablet was denied. Would like a call back with an explanation please at Ph#

## 2021-08-01 NOTE — Addendum Note (Signed)
Addended by: Julieta Bellini on: 08/01/2021 01:22 PM   Modules accepted: Orders

## 2021-08-01 NOTE — Telephone Encounter (Signed)
Pt called in to follow up on her refill request. Advised pt of the below per provider Rosanna Randy. Pt says that she will expect it at the pharmacy.

## 2021-08-01 NOTE — Telephone Encounter (Signed)
Ok to refill? Please advise. Thanks!  

## 2021-08-02 DIAGNOSIS — R6881 Early satiety: Secondary | ICD-10-CM | POA: Diagnosis not present

## 2021-08-02 MED ORDER — TRAMADOL HCL 50 MG PO TABS: 50.0000 mg | ORAL_TABLET | Freq: Four times a day (QID) | ORAL | 2 refills | Status: DC | PRN

## 2021-08-03 ENCOUNTER — Other Ambulatory Visit: Payer: Self-pay

## 2021-08-03 ENCOUNTER — Other Ambulatory Visit: Payer: Self-pay | Admitting: Occupational Therapy

## 2021-08-03 NOTE — Patient Instructions (Signed)
She would like to be able to pick item up off the floor and out of kitchen cabinets easier ( a reacher will help) COMPLETED  ACTION PLANNING Target Problem Area: Difficulty picking items up off the floor and reaching items up in kitchen cabinets  Why Problem May Occur: Back pain  Target Goal: Independence and ease of picking items up off of floor   STRATEGIES Modifying your home environment and making it safe: DO: DON'T:  Use reacher to get items off the floor/out of upper cabinets  Bend over to pick items up/use a step stool to get items out of higher cabinets   Simplifying the way you set up tasks or daily routines: DO: DON'T:  Watch picking up items that are too heavy for reacher. Use 2 hands if you really need to get it down from a cabinet and it is heavy. Carry items around with reacher that are heavier. Have a place close by that you intend to sit it down up higher where you can then pick it up with your hands.   Practice It is important to practice the strategies so we can determine if they will be effective in helping to reach your goal. Follow these specific recommendations: 1. Use reacher consistently to make sure it is working for you properly and as you need it to.  If a strategy does not work the first time, try it again and again (and maybe again). We may make some changes over the next few sessions, based on how they work.   [place], OTR/L      08/03/2021

## 2021-08-03 NOTE — Patient Outreach (Signed)
Aging Gracefully Program  OT Follow-Up Visit  08/03/2021  Julie Acevedo 05-11-1951 [id number]  Visit:  2- Second Visit  Start Time:  0900 End Time:  0925 Total Minutes:  25   Readiness to Change Score :  Readiness to Change Score: 9   Durable Medical Equipment: Adaptive Equipment: Reacher Adaptive Equipment Distribution Date: 08/03/21    Goals:   Goals Addressed             This Visit's Progress    COMPLETED: Patient Stated       She would like to be able to pick item up off the floor and out of kitchen cabinets easier ( a reacher will help) COMPLETED  ACTION PLANNING Target Problem Area: Difficulty picking items up off the floor and reaching items up in kitchen cabinets  Why Problem May Occur: Back pain  Target Goal: Independence and ease of picking items up off of floor   STRATEGIES Modifying your home environment and making it safe: DO: DON'T:  Use reacher to get items off the floor/out of upper cabinets  Bend over to pick items up/use a step stool to get items out of higher cabinets  Simplifying the way you set up tasks or daily routines: DO: DON'T:  Watch picking up items that are too heavy for reacher. Use 2 hands if you really need to get it down from a cabinet and it is heavy. Carry items around with reacher that are heavier. Have a place close by that you intend to sit it down up higher where you can then pick it up with your hands.   Practice It is important to practice the strategies so we can determine if they will be effective in helping to reach your goal. Follow these specific recommendations: 1. Use reacher consistently to make sure it is working for you properly and as you need it to.  If a strategy does not work the first time, try it again and again (and maybe again). We may make some changes over the next few sessions, based on how they work.   [place], OTR/L      08/03/2021         Post Clinical Reasoning: Client Action (Goal)  Three Interventions: Client provided with a reacher and she practiced getting items in and out of her kitchen cabinets as well as we talked about her using it to pick items up off the floor as well Did Client Try?: Yes Targeted Problem Area Status: A Lot Better Clinician View Of Client Situation:: Julie Acevedo is still having issues with her chronic back pain. She reports she fell 2 weeks ago out in the yard. Says she was cleaning out the trashcan with clorox and then next found herself on the ground. Her husband just happened to see her outside on the ground .She was not wearing her life alert pendant at the time. She was really excited to get the reacher (you should have seen the smile on her face). She also feels that the lefg lifter I gave to her husband will also help her get her legs in and out of the car. Client View Of His/Her Situation:: She knows she has to just put up with her chronic back pain. She has had to start taking her Tramadol more since she fell per her report. She says she keeps trying to do the best she can. Next Visit Plan:: shower seat

## 2021-08-17 DIAGNOSIS — G3183 Dementia with Lewy bodies: Secondary | ICD-10-CM | POA: Diagnosis not present

## 2021-08-17 DIAGNOSIS — R296 Repeated falls: Secondary | ICD-10-CM | POA: Diagnosis not present

## 2021-08-17 DIAGNOSIS — F0280 Dementia in other diseases classified elsewhere without behavioral disturbance: Secondary | ICD-10-CM | POA: Diagnosis not present

## 2021-08-20 ENCOUNTER — Other Ambulatory Visit: Payer: Self-pay | Admitting: Family Medicine

## 2021-08-20 DIAGNOSIS — K219 Gastro-esophageal reflux disease without esophagitis: Secondary | ICD-10-CM

## 2021-08-20 NOTE — Telephone Encounter (Signed)
dc'd 06/20/21 Change in therapy Ashok Cordia PA

## 2021-08-22 ENCOUNTER — Other Ambulatory Visit: Payer: Self-pay | Admitting: Neurology

## 2021-08-22 DIAGNOSIS — F0280 Dementia in other diseases classified elsewhere without behavioral disturbance: Secondary | ICD-10-CM

## 2021-08-23 ENCOUNTER — Other Ambulatory Visit: Payer: Self-pay | Admitting: Family Medicine

## 2021-08-28 ENCOUNTER — Ambulatory Visit (INDEPENDENT_AMBULATORY_CARE_PROVIDER_SITE_OTHER): Payer: Medicare HMO

## 2021-08-28 DIAGNOSIS — R55 Syncope and collapse: Secondary | ICD-10-CM

## 2021-08-29 ENCOUNTER — Encounter: Payer: Self-pay | Admitting: Family Medicine

## 2021-08-29 ENCOUNTER — Ambulatory Visit (INDEPENDENT_AMBULATORY_CARE_PROVIDER_SITE_OTHER): Payer: Medicare HMO | Admitting: Family Medicine

## 2021-08-29 ENCOUNTER — Other Ambulatory Visit: Payer: Self-pay

## 2021-08-29 VITALS — BP 111/61 | HR 65 | Temp 98.5°F | Resp 16 | Wt 131.0 lb

## 2021-08-29 DIAGNOSIS — M5417 Radiculopathy, lumbosacral region: Secondary | ICD-10-CM | POA: Diagnosis not present

## 2021-08-29 DIAGNOSIS — Z9884 Bariatric surgery status: Secondary | ICD-10-CM

## 2021-08-29 DIAGNOSIS — N1832 Chronic kidney disease, stage 3b: Secondary | ICD-10-CM | POA: Diagnosis not present

## 2021-08-29 DIAGNOSIS — R413 Other amnesia: Secondary | ICD-10-CM | POA: Diagnosis not present

## 2021-08-29 DIAGNOSIS — F4323 Adjustment disorder with mixed anxiety and depressed mood: Secondary | ICD-10-CM | POA: Diagnosis not present

## 2021-08-29 DIAGNOSIS — E1122 Type 2 diabetes mellitus with diabetic chronic kidney disease: Secondary | ICD-10-CM | POA: Diagnosis not present

## 2021-08-29 DIAGNOSIS — I10 Essential (primary) hypertension: Secondary | ICD-10-CM | POA: Diagnosis not present

## 2021-08-29 DIAGNOSIS — R55 Syncope and collapse: Secondary | ICD-10-CM

## 2021-08-29 LAB — POCT GLYCOSYLATED HEMOGLOBIN (HGB A1C): Hemoglobin A1C: 5.3 % (ref 4.0–5.6)

## 2021-08-29 NOTE — Progress Notes (Signed)
Established patient visit   Patient: Julie Acevedo   DOB: [birth year]   [name]  MRN: [id number] Visit Date: 08/29/2021  Today's healthcare provider: [name], MD   Chief Complaint  Patient presents with   Diabetes   Hypertension   Subjective    HPI  Patient comes in today for follow-up.  She has been diagnosed by neurology with [name].  She has been undergoing GI work up early satiety. She had a possible syncopal episode last month when washing out her trash can.  Last week she suffered some minor abrasions and bruising when she tripped over her dog.  There was no syncope associated with that.  Diabetes Mellitus Type II, follow-up  Lab Results  Component Value Date   HGBA1C 5.3 08/29/2021   HGBA1C 5.5 03/06/2021   HGBA1C 5.6 12/05/2020   Last seen for diabetes 6 months ago.  Management since then includes continuing the same treatment. She reports good compliance with treatment. She is not having side effects.   Home blood sugar records:  not being checked  Episodes of hypoglycemia? No    Current insulin regiment: none Most Recent Eye Exam: up to date   --------------------------------------------------------------------------------------------------- Hypertension, follow-up  BP Readings from Last 3 Encounters:  08/29/21 111/61  07/21/21 100/62  06/20/21 120/65   Wt Readings from Last 3 Encounters:  08/29/21 131 lb (59.4 kg)  07/21/21 130 lb (59 kg)  06/19/21 132 lb (59.9 kg)     She was last seen for hypertension 1 months ago.  BP at that visit was 100/62. Management since that visit includes; seen by Tally Joe. Well controlled. She reports good compliance with treatment. She is not having side effects.  She is not exercising. She is adherent to low salt diet.   Outside blood pressures are checked occasionally.  She does not smoke.  Use of agents associated with hypertension: none.      Medications: Outpatient  Medications Prior to Visit  Medication Sig   cyclobenzaprine (FLEXERIL) 5 MG tablet TAKE 1 TABLET BY MOUTH THREE TIMES A DAY AS NEEDED   gabapentin (NEURONTIN) 100 MG capsule TAKE 2 CAPSULES (200 MG TOTAL) BY MOUTH 3 (THREE) TIMES DAILY. (Patient taking differently: Take 200 mg by mouth 2 (two) times daily.)   hydrochlorothiazide (HYDRODIURIL) 25 MG tablet Take 1 tablet (25 mg total) by mouth daily. (Patient taking differently: Take 12.5 mg by mouth daily.)   LINZESS 290 MCG CAPS capsule TAKE 1 CAPSULE BY MOUTH DAILY BEFORE BREAKFAST.   metFORMIN (GLUCOPHAGE) 500 MG tablet TAKE 1 TABLET (500 MG TOTAL) BY MOUTH 2 (TWO) TIMES DAILY WITH A MEAL. (Patient taking differently: Take 500 mg by mouth daily with breakfast.)   methocarbamol (ROBAXIN) 500 MG tablet Take 500 mg by mouth 2 (two) times daily. Back pain   metoprolol succinate (TOPROL-XL) 25 MG 24 hr tablet Take 1 tablet (25 mg total) by mouth daily.   ondansetron (ZOFRAN) 4 MG tablet Take 1 tablet (4 mg total) by mouth every 8 (eight) hours as needed.   pantoprazole (PROTONIX) 40 MG tablet Take 1 tablet (40 mg total) by mouth daily.   polyethylene glycol powder (GLYCOLAX/MIRALAX) 17 GM/SCOOP powder Take 17 g by mouth daily as needed for moderate constipation.   potassium chloride (KLOR-CON) 10 MEQ tablet Take 1 tablet (10 mEq total) by mouth daily.   traMADol (ULTRAM) 50 MG tablet TAKE 1 TABLET BY MOUTH EVERY 6 HOURS AS NEEDED.  traMADol (ULTRAM) 50 MG tablet Take 1 tablet (50 mg total) by mouth every 6 (six) hours as needed.   traZODone (DESYREL) 50 MG tablet TAKE 1 TABLET (50 MG TOTAL) BY MOUTH AT BEDTIME AS NEEDED.   triamcinolone ointment (KENALOG) 0.5 % Apply 1 application topically 2 (two) times daily.   Vaginal Lubricant (REPLENS) GEL Place 1 Applicatorful vaginally 3 (three) times a week.   magic mouthwash (lidocaine, diphenhydrAMINE, alum & mag hydroxide) suspension Swish and swallow 5 mLs 4 (four) times daily as needed for mouth pain.  (Patient not taking: Reported on 08/29/2021)   No facility-administered medications prior to visit.    Review of Systems  Constitutional:  Negative for appetite change, chills, fatigue and fever.  Respiratory:  Negative for chest tightness and shortness of breath.   Cardiovascular:  Negative for chest pain and palpitations.  Gastrointestinal:  Negative for abdominal pain, nausea and vomiting.  Neurological:  Negative for dizziness and weakness.   Last hemoglobin A1c Lab Results  Component Value Date   HGBA1C 5.3 08/29/2021       Objective    BP 111/61   Pulse 65   Temp 98.5 F (36.9 C)   Resp 16   Wt 131 lb (59.4 kg)   BMI 23.21 kg/m  BP Readings from Last 3 Encounters:  08/29/21 111/61  07/21/21 100/62  06/20/21 120/65   Wt Readings from Last 3 Encounters:  08/29/21 131 lb (59.4 kg)  07/21/21 130 lb (59 kg)  06/19/21 132 lb (59.9 kg)      Physical Exam Vitals reviewed.  Constitutional:      Appearance: She is well-developed.  HENT:     Head: Normocephalic and atraumatic.     Right Ear: Tympanic membrane and external ear normal.     Left Ear: Tympanic membrane and external ear normal.     Nose: Nose normal.     Mouth/Throat:     Mouth: Mucous membranes are moist.     Pharynx: Oropharynx is clear.  Eyes:     General: No scleral icterus.    Conjunctiva/sclera: Conjunctivae normal.     Pupils: Pupils are equal, round, and reactive to light.  Neck:     Thyroid: No thyromegaly.  Cardiovascular:     Rate and Rhythm: Normal rate and regular rhythm.     Heart sounds: Normal heart sounds.  Pulmonary:     Effort: Pulmonary effort is normal.     Breath sounds: Normal breath sounds.  Abdominal:     Palpations: Abdomen is soft.  Lymphadenopathy:     Cervical: No cervical adenopathy.  Skin:    General: Skin is warm and dry.  Neurological:     General: No focal deficit present.     Mental Status: She is alert and oriented to person, place, and time.   Psychiatric:        Mood and Affect: Mood normal.        Behavior: Behavior normal.    ECG reveals sinus bradycardia with a rate of 56 with no ischemic changes.  Results for orders placed or performed in visit on 08/29/21  POCT glycosylated hemoglobin (Hb A1C)  Result Value Ref Range   Hemoglobin A1C 5.3 4.0 - 5.6 %   HbA1c POC (<> result, manual entry)     HbA1c, POC (prediabetic range)     HbA1c, POC (controlled diabetic range)      Assessment & Plan     1. 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  Continue metformin for now. - POCT glycosylated hemoglobin (Hb A1C)  2. Benign essential HTN Well-controlled on HCTZ, metoprolol  3. Syncope, unspecified syncope type Work-up is negative for any cardiac etiology.  No further work-up presently as she is seeing neurology for MCI/possible [name] - EKG 12-Lead  4. L-S radiculopathy Chronic problem which is stable today  5. Memory difficulties Possible [name] per neurology  6. Gastric bypass status for obesity   7. Adjustment disorder with mixed anxiety and depressed mood on no medications.   No follow-ups on file.      I, [name], MD, have reviewed all documentation for this visit. The documentation on 09/03/21 for the exam, diagnosis, procedures, and orders are all accurate and complete.    Prudie Guthridge Cranford Mon, MD  [facility] [phone] (phone) [phone] (fax)  [place]

## 2021-08-30 ENCOUNTER — Other Ambulatory Visit: Payer: Self-pay

## 2021-08-30 ENCOUNTER — Ambulatory Visit
Admission: RE | Admit: 2021-08-30 | Discharge: 2021-08-30 | Disposition: A | Discharge status: Home / Self Care | Payer: Medicare HMO | Source: Ambulatory Visit | Attending: Neurology | Admitting: Neurology

## 2021-08-30 DIAGNOSIS — G3183 Dementia with Lewy bodies: Secondary | ICD-10-CM | POA: Insufficient documentation

## 2021-08-30 DIAGNOSIS — F0280 Dementia in other diseases classified elsewhere without behavioral disturbance: Secondary | ICD-10-CM | POA: Diagnosis not present

## 2021-08-30 DIAGNOSIS — I6782 Cerebral ischemia: Secondary | ICD-10-CM | POA: Diagnosis not present

## 2021-08-31 LAB — CUP PACEART REMOTE DEVICE CHECK
Date Time Interrogation Session: 20220915015936
Implantable Pulse Generator Implant Date: 20190405

## 2021-09-01 NOTE — Progress Notes (Signed)
[facility] Summary Report / Loop Recorder 

## 2021-09-04 ENCOUNTER — Encounter: Payer: Self-pay | Admitting: *Deleted

## 2021-09-04 ENCOUNTER — Other Ambulatory Visit: Payer: Self-pay

## 2021-09-04 NOTE — Patient Outreach (Signed)
Aging Gracefully Program  RN Visit  09/04/2021  Julie Acevedo 03-Dec-1951 [id number]  Visit:   RN home visit #4  Start Time:   1030 End Time:   1130 Total Minutes:   60  Readiness To Change Score:     Universal RN Interventions: Calendar Distribution: Yes Exercise Review: Yes Medications: Yes Medication Changes: Yes Mood: Yes Pain: Yes PCP Advocacy/Support: No Fall Prevention: Yes Incontinence: Yes Clinician View Of Client Situation: Covid Negative.  Home neat and clean. Cooking pintos.  Ambulatory without difficulty.  no falls. Client View Of His/Her Situation: Reports increased abdominal pain. reports c/o nausea. feeling like food gets stuck.  burping and belching.   reports weight up and down.   Right knee is worse.  reports being awaken at night with pain.  Healthcare Provider Communication:none    Public affairs consultant of Client Situation: Public affairs consultant Of Client Situation: Covid Negative.  Home neat and clean. Cooking pintos.  Ambulatory without difficulty.  no falls. Client's View of His/Her Situation: Client View Of His/Her Situation: Reports increased abdominal pain. reports c/o nausea. feeling like food gets stuck.  burping and belching.   reports weight up and down.   Right knee is worse.  reports being awaken at night with pain.  Medication Assessment: Outpatient Encounter Medications as of 09/04/2021  Medication Sig   calcitRIOL (ROCALTROL) 0.25 MCG capsule Take 0.25 mcg by mouth daily.   cyclobenzaprine (FLEXERIL) 5 MG tablet TAKE 1 TABLET BY MOUTH THREE TIMES A DAY AS NEEDED   gabapentin (NEURONTIN) 100 MG capsule TAKE 2 CAPSULES (200 MG TOTAL) BY MOUTH 3 (THREE) TIMES DAILY. (Patient taking differently: Take 200 mg by mouth 2 (two) times daily.)   hydrochlorothiazide (HYDRODIURIL) 25 MG tablet Take 1 tablet (25 mg total) by mouth daily. (Patient taking differently: Take 12.5 mg by mouth daily.)   LINZESS 290 MCG CAPS capsule TAKE 1 CAPSULE BY MOUTH DAILY BEFORE  BREAKFAST.   metFORMIN (GLUCOPHAGE) 500 MG tablet TAKE 1 TABLET (500 MG TOTAL) BY MOUTH 2 (TWO) TIMES DAILY WITH A MEAL. (Patient taking differently: Take 500 mg by mouth daily with breakfast.)   methocarbamol (ROBAXIN) 500 MG tablet Take 500 mg by mouth 2 (two) times daily. Back pain   metoprolol succinate (TOPROL-XL) 25 MG 24 hr tablet Take 1 tablet (25 mg total) by mouth daily.   ondansetron (ZOFRAN) 4 MG tablet Take 1 tablet (4 mg total) by mouth every 8 (eight) hours as needed.   pantoprazole (PROTONIX) 40 MG tablet Take 1 tablet (40 mg total) by mouth daily.   polyethylene glycol powder (GLYCOLAX/MIRALAX) 17 GM/SCOOP powder Take 17 g by mouth daily as needed for moderate constipation.   rivastigmine (EXELON) 3 MG capsule Take 3 mg by mouth 2 (two) times daily.   traMADol (ULTRAM) 50 MG tablet TAKE 1 TABLET BY MOUTH EVERY 6 HOURS AS NEEDED.   traMADol (ULTRAM) 50 MG tablet Take 1 tablet (50 mg total) by mouth every 6 (six) hours as needed.   traZODone (DESYREL) 50 MG tablet TAKE 1 TABLET (50 MG TOTAL) BY MOUTH AT BEDTIME AS NEEDED.   triamcinolone ointment (KENALOG) 0.5 % Apply 1 application topically 2 (two) times daily.   Vaginal Lubricant (REPLENS) GEL Place 1 Applicatorful vaginally 3 (three) times a week.   magic mouthwash (lidocaine, diphenhydrAMINE, alum & mag hydroxide) suspension Swish and swallow 5 mLs 4 (four) times daily as needed for mouth pain. (Patient not taking: Reported on 09/04/2021)   potassium chloride (KLOR-CON) 10 MEQ tablet  Take 1 tablet (10 mEq total) by mouth daily. (Patient not taking: Reported on 09/04/2021)   No facility-administered encounter medications on file as of 09/04/2021.       OT Update: pending home modifications. Contracts signed per patient.   Session Summary: Patient having increased problems with reflux and nausea. For endo tomorrow. Looks thinner to me. Ambulating well. Continues to have back and knee pain.  Reviewed final home visit today and  reminded patient to continue to take her memory medications and rely on her husband and writing of notes to aid in memory.   Goals Addressed             This Visit's Progress    COMPLETED: Patient Stated       Aging Gracefully RN Goal  Patient states that she is worried about continued failing memory.  Assessment: Per patient new diagnosis of dementia. Reviewed concerns. Patient tearful of thought that her kids and grandkids will have to take care of her. Worries she will be a burden.   Interventions:  Encouraged patient to write things down. Encouraged patient to discuss her wishes with her husband and children. Reviewed with patient about finding a support group and learning of other ways to help with memory.  Reviewed pending follow up with MD.  PLAN: next home visit planned for 06/28/2021.  [name], RN, BSN, CEN [place] Coordinator [phone]   06/28/2021 Assessment:  reviewed with client how she felt about her memory today. Reports it is about the same.  States she is making notes and list to remind her of things that she needs to remember. Reports that this technique is working well. Patient is considering starting to journal.  Interventions:  Encouraged patient to continue to makes notes and list. Encouraged patient to talk to family about her concerns.  Plan: next home visit is planned for 07/26/2021  [name], RN, BSN, CEN Aging Gracefully RN [phone]   07/24/2021 Assessments:   reports her memory is about the same.  Reports she continues to keep a list and write things down. Reports she recently had to get her daughter to help her and she was embarrassed. Reports not she can not remember what it was. Not oing exercises.    PLAN: follow up in 1 month.  [name], RN, BSN, CEN Aging Gracefully RN [phone]  Interventions:  reviewed importance of continuing to make herself notes for reminders.       09/04/2021 Assessment:  reports no  changes in memory.  New Rx for Rivastigmine  ( for memory)  reports she can not tell a difference yet.    C/o nausea and pain. Pending endo tomorrow.  Interventions: Encouraged patient to continue to take medications as prescribed. Encouraged patient to continue to write list.   [name] RN, BSN, CEN RN Case Manager for [place] Mobile: [phone]        [name] RN, BSN, Careers information officer for [employer] Mobile: [phone]

## 2021-09-05 ENCOUNTER — Inpatient Hospital Stay
Admission: EM | Admit: 2021-09-05 | Discharge: 2021-09-08 | DRG: 639 | Disposition: A | Discharge status: Home / Self Care | Payer: Medicare HMO | Attending: Internal Medicine | Admitting: Internal Medicine

## 2021-09-05 ENCOUNTER — Encounter
Admission: RE | Disposition: A | Discharge status: Still In Hospital | Payer: Self-pay | Source: Ambulatory Visit | Attending: Gastroenterology

## 2021-09-05 ENCOUNTER — Inpatient Hospital Stay: Payer: Medicare HMO

## 2021-09-05 ENCOUNTER — Encounter: Payer: Self-pay | Admitting: *Deleted

## 2021-09-05 ENCOUNTER — Ambulatory Visit
Admission: RE | Admit: 2021-09-05 | Discharge: 2021-09-05 | Disposition: A | Discharge status: Still In Hospital | Payer: Medicare HMO | Source: Ambulatory Visit | Attending: Gastroenterology | Admitting: Gastroenterology

## 2021-09-05 ENCOUNTER — Encounter: Payer: Self-pay | Admitting: Registered Nurse

## 2021-09-05 ENCOUNTER — Ambulatory Visit: Payer: Medicare HMO

## 2021-09-05 DIAGNOSIS — N1831 Chronic kidney disease, stage 3a: Secondary | ICD-10-CM | POA: Diagnosis present

## 2021-09-05 DIAGNOSIS — G9389 Other specified disorders of brain: Secondary | ICD-10-CM | POA: Diagnosis not present

## 2021-09-05 DIAGNOSIS — I6389 Other cerebral infarction: Secondary | ICD-10-CM | POA: Diagnosis not present

## 2021-09-05 DIAGNOSIS — R6881 Early satiety: Secondary | ICD-10-CM | POA: Diagnosis present

## 2021-09-05 DIAGNOSIS — F0390 Unspecified dementia without behavioral disturbance: Secondary | ICD-10-CM | POA: Diagnosis present

## 2021-09-05 DIAGNOSIS — E876 Hypokalemia: Secondary | ICD-10-CM | POA: Diagnosis not present

## 2021-09-05 DIAGNOSIS — I10 Essential (primary) hypertension: Secondary | ICD-10-CM | POA: Diagnosis present

## 2021-09-05 DIAGNOSIS — Z8249 Family history of ischemic heart disease and other diseases of the circulatory system: Secondary | ICD-10-CM | POA: Diagnosis not present

## 2021-09-05 DIAGNOSIS — E1122 Type 2 diabetes mellitus with diabetic chronic kidney disease: Secondary | ICD-10-CM | POA: Diagnosis not present

## 2021-09-05 DIAGNOSIS — Z882 Allergy status to sulfonamides status: Secondary | ICD-10-CM

## 2021-09-05 DIAGNOSIS — E11649 Type 2 diabetes mellitus with hypoglycemia without coma: Secondary | ICD-10-CM | POA: Diagnosis not present

## 2021-09-05 DIAGNOSIS — R748 Abnormal levels of other serum enzymes: Secondary | ICD-10-CM | POA: Diagnosis present

## 2021-09-05 DIAGNOSIS — Z888 Allergy status to other drugs, medicaments and biological substances status: Secondary | ICD-10-CM

## 2021-09-05 DIAGNOSIS — Z833 Family history of diabetes mellitus: Secondary | ICD-10-CM

## 2021-09-05 DIAGNOSIS — I69351 Hemiplegia and hemiparesis following cerebral infarction affecting right dominant side: Secondary | ICD-10-CM

## 2021-09-05 DIAGNOSIS — Z79899 Other long term (current) drug therapy: Secondary | ICD-10-CM | POA: Insufficient documentation

## 2021-09-05 DIAGNOSIS — I6523 Occlusion and stenosis of bilateral carotid arteries: Secondary | ICD-10-CM | POA: Diagnosis not present

## 2021-09-05 DIAGNOSIS — G459 Transient cerebral ischemic attack, unspecified: Secondary | ICD-10-CM | POA: Diagnosis not present

## 2021-09-05 DIAGNOSIS — Z8616 Personal history of COVID-19: Secondary | ICD-10-CM

## 2021-09-05 DIAGNOSIS — I129 Hypertensive chronic kidney disease with stage 1 through stage 4 chronic kidney disease, or unspecified chronic kidney disease: Secondary | ICD-10-CM | POA: Diagnosis not present

## 2021-09-05 DIAGNOSIS — I252 Old myocardial infarction: Secondary | ICD-10-CM | POA: Insufficient documentation

## 2021-09-05 DIAGNOSIS — Z7984 Long term (current) use of oral hypoglycemic drugs: Secondary | ICD-10-CM

## 2021-09-05 DIAGNOSIS — Z91041 Radiographic dye allergy status: Secondary | ICD-10-CM | POA: Insufficient documentation

## 2021-09-05 DIAGNOSIS — Z9884 Bariatric surgery status: Secondary | ICD-10-CM

## 2021-09-05 DIAGNOSIS — Z20822 Contact with and (suspected) exposure to covid-19: Secondary | ICD-10-CM | POA: Diagnosis not present

## 2021-09-05 DIAGNOSIS — E162 Hypoglycemia, unspecified: Secondary | ICD-10-CM | POA: Diagnosis not present

## 2021-09-05 DIAGNOSIS — D631 Anemia in chronic kidney disease: Secondary | ICD-10-CM | POA: Diagnosis not present

## 2021-09-05 DIAGNOSIS — M5416 Radiculopathy, lumbar region: Secondary | ICD-10-CM

## 2021-09-05 DIAGNOSIS — R55 Syncope and collapse: Secondary | ICD-10-CM | POA: Diagnosis not present

## 2021-09-05 DIAGNOSIS — E119 Type 2 diabetes mellitus without complications: Secondary | ICD-10-CM

## 2021-09-05 DIAGNOSIS — Z8614 Personal history of Methicillin resistant Staphylococcus aureus infection: Secondary | ICD-10-CM | POA: Insufficient documentation

## 2021-09-05 DIAGNOSIS — E785 Hyperlipidemia, unspecified: Secondary | ICD-10-CM | POA: Insufficient documentation

## 2021-09-05 DIAGNOSIS — R2990 Unspecified symptoms and signs involving the nervous system: Secondary | ICD-10-CM

## 2021-09-05 DIAGNOSIS — R531 Weakness: Secondary | ICD-10-CM | POA: Diagnosis not present

## 2021-09-05 DIAGNOSIS — K209 Esophagitis, unspecified without bleeding: Secondary | ICD-10-CM | POA: Diagnosis present

## 2021-09-05 DIAGNOSIS — G319 Degenerative disease of nervous system, unspecified: Secondary | ICD-10-CM | POA: Diagnosis not present

## 2021-09-05 DIAGNOSIS — R29818 Other symptoms and signs involving the nervous system: Secondary | ICD-10-CM | POA: Insufficient documentation

## 2021-09-05 DIAGNOSIS — I639 Cerebral infarction, unspecified: Secondary | ICD-10-CM

## 2021-09-05 DIAGNOSIS — Z8673 Personal history of transient ischemic attack (TIA), and cerebral infarction without residual deficits: Secondary | ICD-10-CM | POA: Diagnosis not present

## 2021-09-05 DIAGNOSIS — R29898 Other symptoms and signs involving the musculoskeletal system: Secondary | ICD-10-CM | POA: Diagnosis not present

## 2021-09-05 DIAGNOSIS — I672 Cerebral atherosclerosis: Secondary | ICD-10-CM | POA: Diagnosis not present

## 2021-09-05 HISTORY — DX: Nontoxic single thyroid nodule: E04.1

## 2021-09-05 LAB — URINALYSIS, COMPLETE (UACMP) WITH MICROSCOPIC
Bacteria, UA: NONE SEEN
Bilirubin Urine: NEGATIVE
Glucose, UA: 250 mg/dL — AB
Ketones, ur: NEGATIVE mg/dL
Leukocytes,Ua: NEGATIVE
Nitrite: NEGATIVE
Protein, ur: NEGATIVE mg/dL
Specific Gravity, Urine: 1.015 (ref 1.005–1.030)
pH: 7 (ref 5.0–8.0)

## 2021-09-05 LAB — CBG MONITORING, ED
Glucose-Capillary: 188 mg/dL — ABNORMAL HIGH (ref 70–99)
Glucose-Capillary: 86 mg/dL (ref 70–99)
Glucose-Capillary: 130 mg/dL — ABNORMAL HIGH (ref 70–99)
Glucose-Capillary: 40 mg/dL — CL (ref 70–99)

## 2021-09-05 LAB — DIFFERENTIAL
Abs Immature Granulocytes: 0.02 10*3/uL (ref 0.00–0.07)
Basophils Absolute: 0 10*3/uL (ref 0.0–0.1)
Basophils Relative: 0 %
Eosinophils Absolute: 0.1 10*3/uL (ref 0.0–0.5)
Eosinophils Relative: 1 %
Immature Granulocytes: 0 %
Lymphocytes Relative: 26 %
Lymphs Abs: 1.5 10*3/uL (ref 0.7–4.0)
Monocytes Absolute: 0.4 10*3/uL (ref 0.1–1.0)
Monocytes Relative: 7 %
Neutro Abs: 3.7 10*3/uL (ref 1.7–7.7)
Neutrophils Relative %: 66 %

## 2021-09-05 LAB — URINE DRUG SCREEN, QUALITATIVE (ARMC ONLY)
Amphetamines, Ur Screen: NOT DETECTED
Barbiturates, Ur Screen: NOT DETECTED
Benzodiazepine, Ur Scrn: NOT DETECTED
Cannabinoid 50 Ng, Ur ~~LOC~~: NOT DETECTED
Cocaine Metabolite,Ur ~~LOC~~: NOT DETECTED
MDMA (Ecstasy)Ur Screen: NOT DETECTED
Methadone Scn, Ur: NOT DETECTED
Opiate, Ur Screen: NOT DETECTED
Phencyclidine (PCP) Ur S: NOT DETECTED
Tricyclic, Ur Screen: POSITIVE — AB

## 2021-09-05 LAB — GLUCOSE, CAPILLARY
Glucose-Capillary: 105 mg/dL — ABNORMAL HIGH (ref 70–99)
Glucose-Capillary: 49 mg/dL — ABNORMAL LOW (ref 70–99)
Glucose-Capillary: 53 mg/dL — ABNORMAL LOW (ref 70–99)
Glucose-Capillary: 247 mg/dL — ABNORMAL HIGH (ref 70–99)

## 2021-09-05 LAB — COMPREHENSIVE METABOLIC PANEL
ALT: 7 U/L (ref 0–44)
AST: 17 U/L (ref 15–41)
Albumin: 3.3 g/dL — ABNORMAL LOW (ref 3.5–5.0)
Alkaline Phosphatase: 152 U/L — ABNORMAL HIGH (ref 38–126)
Anion gap: 9 (ref 5–15)
BUN: 17 mg/dL (ref 8–23)
CO2: 28 mmol/L (ref 22–32)
Calcium: 8.6 mg/dL — ABNORMAL LOW (ref 8.9–10.3)
Chloride: 100 mmol/L (ref 98–111)
Creatinine, Ser: 1.39 mg/dL — ABNORMAL HIGH (ref 0.44–1.00)
GFR, Estimated: 41 mL/min — ABNORMAL LOW (ref >60)
Glucose, Bld: 332 mg/dL — ABNORMAL HIGH (ref 70–99)
Potassium: 3.3 mmol/L — ABNORMAL LOW (ref 3.5–5.1)
Sodium: 137 mmol/L (ref 135–145)
Total Bilirubin: 0.6 mg/dL (ref 0.3–1.2)
Total Protein: 6.4 g/dL — ABNORMAL LOW (ref 6.5–8.1)

## 2021-09-05 LAB — CBC
HCT: 33.7 % — ABNORMAL LOW (ref 36.0–46.0)
Hemoglobin: 11.1 g/dL — ABNORMAL LOW (ref 12.0–15.0)
MCH: 30.1 pg (ref 26.0–34.0)
MCHC: 32.9 g/dL (ref 30.0–36.0)
MCV: 91.3 fL (ref 80.0–100.0)
Platelets: 237 10*3/uL (ref 150–400)
RBC: 3.69 MIL/uL — ABNORMAL LOW (ref 3.87–5.11)
RDW: 13.9 % (ref 11.5–15.5)
WBC: 5.7 10*3/uL (ref 4.0–10.5)
nRBC: 0 % (ref 0.0–0.2)

## 2021-09-05 LAB — RESP PANEL BY RT-PCR (FLU A&B, COVID) ARPGX2
Influenza A by PCR: NEGATIVE
Influenza B by PCR: NEGATIVE
SARS Coronavirus 2 by RT PCR: NEGATIVE

## 2021-09-05 LAB — CK: Total CK: 58 U/L (ref 38–234)

## 2021-09-05 LAB — PROTIME-INR
INR: 1.1 (ref 0.8–1.2)
Prothrombin Time: 14 seconds (ref 11.4–15.2)

## 2021-09-05 LAB — ETHANOL: Alcohol, Ethyl (B): <10 mg/dL (ref <10)

## 2021-09-05 LAB — APTT: aPTT: 31 seconds (ref 24–36)

## 2021-09-05 SURGERY — ESOPHAGOGASTRODUODENOSCOPY (EGD) WITH PROPOFOL
Anesthesia: General

## 2021-09-05 MED ORDER — ACETAMINOPHEN 325 MG PO TABS
650.0000 mg | ORAL_TABLET | ORAL | Status: DC | PRN
  Administered 2021-09-06 (×2): 650 mg via ORAL
  Filled 2021-09-05 (×3): qty 2

## 2021-09-05 MED ORDER — FENTANYL CITRATE (PF) 100 MCG/2ML IJ SOLN
INTRAMUSCULAR | Status: AC
  Filled 2021-09-05: qty 2

## 2021-09-05 MED ORDER — ONDANSETRON HCL 4 MG/2ML IJ SOLN
4.0000 mg | Freq: Four times a day (QID) | INTRAMUSCULAR | Status: DC | PRN
  Administered 2021-09-05 – 2021-09-08 (×2): 4 mg via INTRAVENOUS
  Filled 2021-09-05 (×3): qty 2

## 2021-09-05 MED ORDER — TENECTEPLASE FOR STROKE
0.2500 mg/kg | PACK | Freq: Once | INTRAVENOUS | Status: AC
  Administered 2021-09-05: 15 mg via INTRAVENOUS

## 2021-09-05 MED ORDER — DEXTROSE 50 % IV SOLN
INTRAVENOUS | Status: AC
  Filled 2021-09-05: qty 50

## 2021-09-05 MED ORDER — SENNOSIDES-DOCUSATE SODIUM 8.6-50 MG PO TABS: 1.0000 | ORAL_TABLET | Freq: Every evening | ORAL | Status: DC | PRN

## 2021-09-05 MED ORDER — DEXTROSE 50 % IV SOLN: 1.0000 | Freq: Once | INTRAVENOUS | Status: DC

## 2021-09-05 MED ORDER — SODIUM CHLORIDE 0.9 % IV SOLN: Freq: Once | INTRAVENOUS | Status: AC

## 2021-09-05 MED ORDER — POTASSIUM CHLORIDE 10 MEQ/100ML IV SOLN
10.0000 meq | INTRAVENOUS | Status: AC
Start: 2021-09-05 — End: 2021-09-06
  Administered 2021-09-05 – 2021-09-06 (×3): 10 meq via INTRAVENOUS
  Filled 2021-09-05 (×2): qty 100

## 2021-09-05 MED ORDER — ACETAMINOPHEN 325 MG RE SUPP
650.0000 mg | RECTAL | Status: DC | PRN
  Filled 2021-09-05: qty 2

## 2021-09-05 MED ORDER — ACETAMINOPHEN 160 MG/5ML PO SOLN
650.0000 mg | ORAL | Status: DC | PRN
  Filled 2021-09-05: qty 20.3

## 2021-09-05 MED ORDER — SODIUM CHLORIDE 0.9 % IV SOLN: INTRAVENOUS | Status: DC

## 2021-09-05 MED ORDER — DEXTROSE-NACL 5-0.45 % IV SOLN: INTRAVENOUS | Status: DC

## 2021-09-05 MED ORDER — PANTOPRAZOLE SODIUM 40 MG IV SOLR
40.0000 mg | Freq: Every day | INTRAVENOUS | Status: DC
  Administered 2021-09-05 – 2021-09-07 (×3): 40 mg via INTRAVENOUS
  Filled 2021-09-05 (×3): qty 40

## 2021-09-05 MED ORDER — STROKE: EARLY STAGES OF RECOVERY BOOK: Freq: Once | Status: DC

## 2021-09-05 MED ORDER — PROPOFOL 10 MG/ML IV BOLUS
INTRAVENOUS | Status: AC
  Filled 2021-09-05: qty 40

## 2021-09-05 MED ORDER — DEXTROSE 50 % IV SOLN
50.0000 mL | Freq: Once | INTRAVENOUS | Status: AC
  Administered 2021-09-05: 50 mL via INTRAVENOUS

## 2021-09-05 MED ORDER — RIVASTIGMINE TARTRATE 3 MG PO CAPS
3.0000 mg | ORAL_CAPSULE | Freq: Two times a day (BID) | ORAL | Status: DC
  Administered 2021-09-05 – 2021-09-08 (×6): 3 mg via ORAL
  Filled 2021-09-05 (×8): qty 1

## 2021-09-05 NOTE — ED Notes (Signed)
This RN to recheck BG soon. 3 providers currently at bedside assessing pt.

## 2021-09-05 NOTE — Code Documentation (Signed)
Stroke Response Nurse Documentation Code Documentation  Julie Acevedo is a 70 y.o. female arriving to [facility] ED via [employer] on 9/20. Code stroke was activated by Endo staff.    Patient from endo where she was LKW at 1300. Initially her CBG was 44, given amp of D50 and noted that pts sugar improved but symptoms did not. Pt noted to have rt sided facial droop and leg and arm weakness.  Stroke team at the bedside on patient arrival. Labs drawn and patient cleared for CT by Dr. Ellender Hose. Patient to CT with team. CT was done.   Bedside handoff with ED RN Claiborne Billings.    Velta Addison Stroke Response RN

## 2021-09-05 NOTE — H&P (Signed)
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 Rapid response was called and she was noted to have a blood sugar of 44 and was given D50 and IV fluid bolus. Her blood sugar improved to 105 but her mental status did not improve. Code stroke was then called and stat CT head obtained which did not show acute hemorrhage.  Neurology evaluated and decided to give TPA therapy. PCCM has been called to admit the patient for close neurologic monitoring.   Patient's husband is at the bedside. He reported she was in normal health without any issues prior to today.  He said that she had a stroke about 20 to 23 years ago.  Otherwise she has been driving, going to church and running errands independently.  Pertinent  Medical History   Past Medical History:  Diagnosis Date   Anemia    Arthritis    COVID-19 11/19/2019   Diverticulitis    DM (diabetes mellitus) (HCC)    typ e 2   History of methicillin resistant staphylococcus aureus (MRSA) 2017   HTN (hypertension)    Hyperlipidemia    Memory difficulties 09/01/2015   Multiple thyroid nodules    Myocardial infarction (HCC) 1995   Short-term memory loss    Thyroid nodule     Significant Hospital Events: Including procedures, antibiotic start and stop dates in addition to other pertinent events   9/20 Admitted to ICU statpus TPA for code stroke   Interim History / Subjective:  N/a  Objective   Blood pressure 110/86, pulse 72, resp. rate 18, height 5\' 4"  (1.626 m), weight 60.7 kg, SpO2 100 %.       No intake or output data in the 24 hours ending  09/05/21 1538 Filed Weights   09/05/21 1422  Weight: 60.7 kg    Examination: General: Elderly woman, family, no acute distress HENT: [place]/AT, PERRL, moist mucous membranes, sclera anicteric Lungs: Clear to auscultation bilaterally, no wheezing. Cardiovascular: Regular rate and rhythm, no murmurs Abdomen: Soft, nontender, nondistended, bowel sounds present Extremities: Warm, no edema Neuro: Alert, sluggish but following commands.  5/5 strength in the left upper and lower extremities with 2-3/5 strength in right upper extremity and 1/5 strength in right lower extremity.  Right facial droop present with smiling. GU: No Foley in place.  Resolved Hospital Problem list     Assessment & Plan:  Acute Mental Status Change Acute Stroke - Post TPA protocol ordered - q1hr neuro checks - Neurology following, follow up recommendations - Echo, MRI brain and 09/07/21 carotid ordered - CK level ordered and fasting lipid panel - start statin once passes speech eval - No antiplatelet medications or anticoagulants for at least 24 hours following TNK. - Will need to be started on antiplatelet therapy if follow up CT at 24 hours is negative for hemorrhagic conversion  Diabetes Melltius Type II - hold home metformin - SSI insulin when D5 not needed  Hypertension - PRN hydralazine IV - Hold home HCTZ  Hypoglycemia - continue D5  Anemia, Chronic - Monitor  Elevated Alk Phos - monitor - no acute  abdominal issues  Hypokalemia - replete IV  Memory Loss/Dementia - continue rivastigmine  Best Practice (right click and "Reselect all SmartList Selections" daily)   Diet/type: NPO DVT prophylaxis: SCD GI prophylaxis: PPI Lines: N/A Foley:  N/A Code Status:  full code Last date of multidisciplinary goals of care discussion [n/a]  Labs   CBC: Recent Labs  Lab 09/05/21 1414  WBC 5.7  NEUTROABS 3.7  HGB 11.1*  HCT 33.7*  MCV 91.3  PLT 097    Basic Metabolic Panel: Recent Labs  Lab  09/05/21 1414  NA 137  K 3.3*  CL 100  CO2 28  GLUCOSE 332*  BUN 17  CREATININE 1.39*  CALCIUM 8.6*   GFR: Estimated Creatinine Clearance: 33.4 mL/min (A) (by C-G formula based on SCr of 1.39 mg/dL (H)). Recent Labs  Lab 09/05/21 1414  WBC 5.7    Liver Function Tests: Recent Labs  Lab 09/05/21 1414  AST 17  ALT 7  ALKPHOS 152*  BILITOT 0.6  PROT 6.4*  ALBUMIN 3.3*   No results for input(s): LIPASE, AMYLASE in the last 168 hours. No results for input(s): AMMONIA in the last 168 hours.  ABG    Component Value Date/Time   PHART 7.397 11/15/2012 2330   PCO2ART 45.1 (H) 11/15/2012 2330   PO2ART 136.0 (H) 11/15/2012 2330   HCO3 36.7 (H) 08/29/2016 1929   TCO2 28 12/08/2019 1129     Coagulation Profile: Recent Labs  Lab 09/05/21 1414  INR 1.1    Cardiac Enzymes: No results for input(s): CKTOTAL, CKMB, CKMBINDEX, TROPONINI in the last 168 hours.  HbA1C: Hemoglobin A1C  Date/Time Value Ref Range Status  08/29/2021 09:03 AM 5.3 4.0 - 5.6 % Final  01/18/2014 06:32 AM 6.3 4.2 - 6.3 % Final    Comment:    The American Diabetes Association recommends that a primary goal of therapy should be <7% and that physicians should reevaluate the treatment regimen in patients with HbA1c values consistently >8%.    Hgb A1c MFr Bld  Date/Time Value Ref Range Status  03/06/2021 04:26 PM 5.5 4.8 - 5.6 % Final    Comment:             Prediabetes: 5.7 - 6.4          Diabetes: >6.4          Glycemic control for adults with diabetes: <7.0   12/05/2020 08:55 AM 5.6 4.8 - 5.6 % Final    Comment:             Prediabetes: 5.7 - 6.4          Diabetes: >6.4          Glycemic control for adults with diabetes: <7.0     CBG: Recent Labs  Lab 09/05/21 1243 09/05/21 1307 09/05/21 1326 09/05/21 1413  GLUCAP 49* 53* 105* 188*    Review of Systems:   Unable to complete review of systems due to patient's altered mental status.  Past Medical History:  She,  has a past  medical history of Anemia, Arthritis, COVID-19 (11/19/2019), Diverticulitis, DM (diabetes mellitus) ([place]), History of methicillin resistant staphylococcus aureus (MRSA) (2017), HTN (hypertension), Hyperlipidemia, Memory difficulties (09/01/2015), Multiple thyroid nodules, Myocardial infarction ([place]) (1995), Short-term memory loss, and Thyroid nodule.   Surgical History:   Past Surgical History:  Procedure Laterality Date   ABDOMINAL HYSTERECTOMY     due to endometriosis-1 ovary left   BREAST EXCISIONAL BIOPSY Right 1971   neg   CARDIAC  CATHETERIZATION  2000   no significant CAD per note of Dr Caryl Comes in 2013    [place] N/A 03/19/2017   Procedure: ANTERIOR REPAIR (CYSTOCELE);  Surgeon: Bjorn Loser, MD;  Location: [facility];  Service: Urology;  Laterality: N/A;   CYSTOSCOPY N/A 03/19/2017   Procedure: CYSTOSCOPY;  Surgeon: Bjorn Loser, MD;  Location: [facility];  Service: Urology;  Laterality: N/A;   EYE SURGERY     FOOT SURGERY     HAND SURGERY     HEMICOLECTOMY     INSERTION OF MESH N/A 08/21/2016   Procedure: INSERTION OF MESH;  Surgeon: Excell Seltzer, MD;  Location: [facility];  Service: General;  Laterality: N/A;   INSERTION OF MESH N/A 12/08/2019   Procedure: INSERTION OF MESH;  Surgeon: Jules Husbands, MD;  Location: [facility];  Service: General;  Laterality: N/A;   LAPAROSCOPIC LYSIS OF ADHESIONS N/A 08/21/2016   Procedure: LAPAROSCOPIC LYSIS OF ADHESIONS;  Surgeon: Excell Seltzer, MD;  Location: [facility];  Service: General;  Laterality: N/A;   LAPAROSCOPIC REMOVAL ABDOMINAL MASS     LAPAROTOMY N/A 04/16/2019   Procedure: EXPLORATORY LAPAROTOMY;  Surgeon: Benjamine Sprague, DO;  Location: [facility];  Service: General;  Laterality: N/A;   LOOP RECORDER INSERTION N/A 03/21/2018   Procedure: LOOP RECORDER INSERTION;  Surgeon: Sanda Klein, MD;  Location: [facility];  Service: Cardiovascular;  Laterality: N/A;   LUMBAR DISC SURGERY     ROUX-EN-Y  GASTRIC BYPASS  2010   Dr Hassell Done   SMALL INTESTINE SURGERY     TONSILLECTOMY     UPPER GI ENDOSCOPY  01/12/2016   normal larynx, normal esophagus. gastric bypass with a normal-sized pouch and intact staple line, normal examined jejunum, otherwise exam was normal   VENTRAL HERNIA REPAIR N/A 08/21/2016   Procedure: LAPAROSCOPIC VENTRAL HERNIA;  Surgeon: Excell Seltzer, MD;  Location: [facility];  Service: General;  Laterality: N/A;   XI ROBOTIC ASSISTED VENTRAL HERNIA N/A 12/08/2019   Procedure: XI ROBOTIC ASSISTED VENTRAL HERNIA;  Surgeon: Jules Husbands, MD;  Location: [facility];  Service: General;  Laterality: N/A;     Social History:   reports that she has never smoked. She has never used smokeless tobacco. She reports that she does not drink alcohol and does not use drugs.   Family History:  Her family history includes Breast cancer in her paternal grandmother; Breast cancer (age of onset: 39) in her paternal aunt; Breast cancer (age of onset: 72) in her paternal aunt; COPD in her mother; Cancer in her paternal aunt and paternal grandmother; Cancer (age of onset: 71) in her father; Congestive Heart Failure in her maternal grandmother; Coronary artery disease (age of onset: 22) in her father; Diabetes in her mother; Emphysema in her maternal grandfather; Heart attack in her paternal grandfather; Hypertension in her mother.   Allergies Allergies  Allergen Reactions   Lotrel [Amlodipine Besy-Benazepril Hcl] Anaphylaxis   Contrast Media [Iodinated Diagnostic Agents] Swelling and Other (See Comments)    Reaction:  Neck swelling    Niacin Hives   Sulfa Antibiotics Hives     Home Medications  Prior to Admission medications   Medication Sig Start Date End Date Taking? Authorizing Provider  calcitRIOL (ROCALTROL) 0.25 MCG capsule Take 0.25 mcg by mouth daily.   Yes [provider]  cyclobenzaprine (FLEXERIL) 5 MG tablet TAKE 1 TABLET BY MOUTH THREE TIMES A DAY AS NEEDED 08/02/21  Yes  Jerrol Banana., MD  gabapentin (NEURONTIN) 100 MG capsule TAKE 2 CAPSULES (200 MG TOTAL) BY MOUTH 3 (THREE) TIMES DAILY. Patient taking differently: Take 200 mg by mouth 2 (two) times daily. 01/24/21  Yes Jerrol Banana., MD  hydrochlorothiazide (HYDRODIURIL) 25 MG tablet Take 1 tablet (25 mg total) by mouth daily. 04/06/21  Yes Jerrol Banana., MD  LINZESS 290 MCG CAPS capsule TAKE 1 CAPSULE BY MOUTH DAILY BEFORE BREAKFAST. 06/30/21  Yes Jerrol Banana., MD  Melatonin 10 MG CAPS Take 10 mg by mouth at bedtime as needed.   Yes [provider]  metFORMIN (GLUCOPHAGE) 500 MG tablet TAKE 1 TABLET (500 MG TOTAL) BY MOUTH 2 (TWO) TIMES DAILY WITH A MEAL. 01/10/21  Yes Jerrol Banana., MD  metoprolol succinate (TOPROL-XL) 25 MG 24 hr tablet Take 1 tablet (25 mg total) by mouth daily. 04/06/21  Yes Jerrol Banana., MD  ondansetron (ZOFRAN) 4 MG tablet Take 1 tablet (4 mg total) by mouth every 8 (eight) hours as needed. 03/20/21  Yes Pabon, Diego F, MD  polyethylene glycol powder (GLYCOLAX/MIRALAX) 17 GM/SCOOP powder Take 17 g by mouth daily as needed for moderate constipation. 04/04/21  Yes Harvest Dark, MD  rivastigmine (EXELON) 3 MG capsule Take 3 mg by mouth 2 (two) times daily.   Yes [provider]  traMADol (ULTRAM) 50 MG tablet Take 1 tablet (50 mg total) by mouth every 6 (six) hours as needed. 08/02/21  Yes Jerrol Banana., MD  traZODone (DESYREL) 50 MG tablet TAKE 1 TABLET (50 MG TOTAL) BY MOUTH AT BEDTIME AS NEEDED. 08/23/21  Yes Jerrol Banana., MD  magic mouthwash (lidocaine, diphenhydrAMINE, alum & mag hydroxide) suspension Swish and swallow 5 mLs 4 (four) times daily as needed for mouth pain. Patient not taking: No sig reported 07/21/21   Tally Joe T, FNP  pantoprazole (PROTONIX) 40 MG tablet Take 1 tablet (40 mg total) by mouth daily. Patient not taking: No sig reported 06/20/21 06/20/22  Caryn Section Linden Dolin, PA-C  potassium  chloride (KLOR-CON) 10 MEQ tablet Take 1 tablet (10 mEq total) by mouth daily. Patient not taking: No sig reported 04/18/21   Letitia Neri L, PA-C  traMADol (ULTRAM) 50 MG tablet TAKE 1 TABLET BY MOUTH EVERY 6 HOURS AS NEEDED. Patient not taking: No sig reported 08/02/21   Jerrol Banana., MD  triamcinolone ointment (KENALOG) 0.5 % Apply 1 application topically 2 (two) times daily. Patient not taking: No sig reported 07/21/21   Tally Joe T, FNP  Vaginal Lubricant (REPLENS) GEL Place 1 Applicatorful vaginally 3 (three) times a week. 11/30/20   Gae Dry, MD     Critical care time: 50 minutes    Freda Jackson, MD [facility] Office: [phone]   See Amion for personal pager PCCM on call pager [phone] until 7pm. Please call El[facility] 7p-7a. [phone]

## 2021-09-05 NOTE — ED Notes (Signed)
Attempted report to ICU again. Gave this RN's number and name again.

## 2021-09-05 NOTE — ED Notes (Signed)
2nd RN collecting D50 as pt's BG 40 again.

## 2021-09-05 NOTE — ED Notes (Signed)
Pt to CT

## 2021-09-05 NOTE — ED Notes (Signed)
Pt urinated. Pt completed peri care herself. Husband remains at bedside.

## 2021-09-05 NOTE — ED Notes (Signed)
Verbal from provider [place] that pt okay to eat; verbal for this RN to place diet order.

## 2021-09-05 NOTE — ED Notes (Signed)
Pt denies nausea currently but giving zofran in anticipation that pt may get nauseous when taking to take her night meds.

## 2021-09-05 NOTE — ED Notes (Addendum)
Pt given food tray and drink of choice. Pt took a sip of OJ and immediately stated she feels nauseous. Given emesis bag. Requesting an EDP place a PRN order for zofran.

## 2021-09-05 NOTE — ED Provider Notes (Signed)
[facility] Emergency Department Provider Note  ____________________________________________   Event Date/Time   First MD Initiated Contact with Patient 09/05/21 1408     (approximate)  I have reviewed the triage vital signs and the nursing notes.   HISTORY  Chief Complaint Code Stroke    HPI Julie Acevedo is a 70 y.o. female here with altered mental status.  The patient was here for EGD for esophagitis.  She reportedly became less responsive and experienced acute onset of right-sided weakness and facial droop.  She had her blood sugar checked and it was 44.  She was given D50, but had persistent altered mental status, slightly delayed speech, right-sided neglect.  She was ultimately activated as a code stroke and sent to the CT scanner.  She arrives with neurology at bedside.  She remains confused.  Remainder of history limited.  She shakes her head no to any pain.  Level 5 caveat invoked as remainder of history, ROS, and physical exam limited due to patient's mental status change.   Past Medical History:  Diagnosis Date   Anemia    Arthritis    COVID-19 11/19/2019   Diverticulitis    DM (diabetes mellitus) ([place])    typ e 2   History of methicillin resistant staphylococcus aureus (MRSA) 2017   HTN (hypertension)    Hyperlipidemia    Memory difficulties 09/01/2015   Multiple thyroid nodules    Myocardial infarction ([place]) 1995   Short-term memory loss    Thyroid nodule     Patient Active Problem List   Diagnosis Date Noted   Stomatitis and mucositis 07/21/2021   Cheilitis 07/21/2021   Callus of foot 01/19/2021   Pes planus 01/19/2021   PTTD (posterior tibial tendon dysfunction) 01/19/2021   Acute flank pain 12/15/2020   Cystitis 12/15/2020   Urinary symptom or sign 12/15/2020   Low back pain radiating to right lower extremity 06/23/2020   Gastroesophageal reflux disease 04/28/2020   Large bowel obstruction ([place]) 04/17/2019   Acquired  trigger finger 03/20/2019   Iron deficiency anemia 11/21/2018   Syncope 03/18/2018   Anemia 03/18/2018   Exophthalmos 02/22/2018   Multiple thyroid nodules 02/22/2018   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   Pain at surgical site 08/30/2016   Ventral incisional hernia 08/21/2016   SBO (small bowel obstruction) (HCC)    Partial small bowel obstruction ([place]) 04/23/2016   Adjustment disorder with mixed anxiety and depressed mood 04/23/2016   Memory difficulties 09/01/2015   Arthritis 06/11/2015   Back pain, chronic 06/11/2015   HLD (hyperlipidemia) 06/11/2015   Cannot sleep 06/11/2015   L-S radiculopathy 06/11/2015   Arthritis of knee, degenerative 06/11/2015   B12 deficiency 06/11/2015   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   DM2 (diabetes mellitus, type 2) ([place]) 11/15/2012   Adiposity 11/21/2009   Avitaminosis D 11/07/2009   Narrowing of intervertebral disc space 07/25/2009   Benign essential HTN 07/25/2009    Past Surgical History:  Procedure Laterality Date   ABDOMINAL HYSTERECTOMY     due to endometriosis-1 ovary left   BREAST EXCISIONAL BIOPSY Right 1971   neg   CARDIAC CATHETERIZATION  2000   no significant CAD per note of Dr Caryl Comes in 2013    [place] N/A 03/19/2017   Procedure: ANTERIOR REPAIR (CYSTOCELE);  Surgeon: Bjorn Loser, MD;  Location: [facility];  Service: Urology;  Laterality: N/A;   CYSTOSCOPY N/A 03/19/2017  Procedure: CYSTOSCOPY;  Surgeon: Bjorn Loser, MD;  Location: [facility];  Service: Urology;  Laterality: N/A;   EYE SURGERY     FOOT SURGERY     HAND SURGERY     HEMICOLECTOMY     INSERTION OF MESH N/A 08/21/2016   Procedure: INSERTION OF MESH;  Surgeon: Excell Seltzer, MD;  Location: [facility];  Service: General;  Laterality: N/A;   INSERTION OF MESH N/A 12/08/2019   Procedure: INSERTION OF MESH;  Surgeon: Jules Husbands, MD;  Location: [facility];  Service:  General;  Laterality: N/A;   LAPAROSCOPIC LYSIS OF ADHESIONS N/A 08/21/2016   Procedure: LAPAROSCOPIC LYSIS OF ADHESIONS;  Surgeon: Excell Seltzer, MD;  Location: [facility];  Service: General;  Laterality: N/A;   LAPAROSCOPIC REMOVAL ABDOMINAL MASS     LAPAROTOMY N/A 04/16/2019   Procedure: EXPLORATORY LAPAROTOMY;  Surgeon: Benjamine Sprague, DO;  Location: [facility];  Service: General;  Laterality: N/A;   LOOP RECORDER INSERTION N/A 03/21/2018   Procedure: LOOP RECORDER INSERTION;  Surgeon: Sanda Klein, MD;  Location: [facility];  Service: Cardiovascular;  Laterality: N/A;   LUMBAR DISC SURGERY     ROUX-EN-Y GASTRIC BYPASS  2010   Dr Hassell Done   SMALL INTESTINE SURGERY     TONSILLECTOMY     UPPER GI ENDOSCOPY  01/12/2016   normal larynx, normal esophagus. gastric bypass with a normal-sized pouch and intact staple line, normal examined jejunum, otherwise exam was normal   VENTRAL HERNIA REPAIR N/A 08/21/2016   Procedure: LAPAROSCOPIC VENTRAL HERNIA;  Surgeon: Excell Seltzer, MD;  Location: [facility];  Service: General;  Laterality: N/A;   XI ROBOTIC ASSISTED VENTRAL HERNIA N/A 12/08/2019   Procedure: XI ROBOTIC ASSISTED VENTRAL HERNIA;  Surgeon: Jules Husbands, MD;  Location: [facility];  Service: General;  Laterality: N/A;    Prior to Admission medications   Medication Sig Start Date End Date Taking? Authorizing Provider  calcitRIOL (ROCALTROL) 0.25 MCG capsule Take 0.25 mcg by mouth daily.   Yes [provider]  cyclobenzaprine (FLEXERIL) 5 MG tablet TAKE 1 TABLET BY MOUTH THREE TIMES A DAY AS NEEDED 08/02/21  Yes Jerrol Banana., MD  gabapentin (NEURONTIN) 100 MG capsule TAKE 2 CAPSULES (200 MG TOTAL) BY MOUTH 3 (THREE) TIMES DAILY. Patient taking differently: Take 200 mg by mouth 2 (two) times daily. 01/24/21  Yes Jerrol Banana., MD  hydrochlorothiazide (HYDRODIURIL) 25 MG tablet Take 1 tablet (25 mg total) by mouth daily. 04/06/21  Yes Jerrol Banana., MD   LINZESS 290 MCG CAPS capsule TAKE 1 CAPSULE BY MOUTH DAILY BEFORE BREAKFAST. 06/30/21  Yes Jerrol Banana., MD  Melatonin 10 MG CAPS Take 10 mg by mouth at bedtime as needed.   Yes [provider]  metFORMIN (GLUCOPHAGE) 500 MG tablet TAKE 1 TABLET (500 MG TOTAL) BY MOUTH 2 (TWO) TIMES DAILY WITH A MEAL. 01/10/21  Yes Jerrol Banana., MD  metoprolol succinate (TOPROL-XL) 25 MG 24 hr tablet Take 1 tablet (25 mg total) by mouth daily. 04/06/21  Yes Jerrol Banana., MD  ondansetron (ZOFRAN) 4 MG tablet Take 1 tablet (4 mg total) by mouth every 8 (eight) hours as needed. 03/20/21  Yes Pabon, Diego F, MD  polyethylene glycol powder (GLYCOLAX/MIRALAX) 17 GM/SCOOP powder Take 17 g by mouth daily as needed for moderate constipation. 04/04/21  Yes Harvest Dark, MD  rivastigmine (EXELON) 3 MG capsule Take 3 mg by mouth 2 (two) times daily.  Yes [provider]  traMADol (ULTRAM) 50 MG tablet Take 1 tablet (50 mg total) by mouth every 6 (six) hours as needed. 08/02/21  Yes Jerrol Banana., MD  traZODone (DESYREL) 50 MG tablet TAKE 1 TABLET (50 MG TOTAL) BY MOUTH AT BEDTIME AS NEEDED. 08/23/21  Yes Jerrol Banana., MD  magic mouthwash (lidocaine, diphenhydrAMINE, alum & mag hydroxide) suspension Swish and swallow 5 mLs 4 (four) times daily as needed for mouth pain. Patient not taking: No sig reported 07/21/21   Tally Joe T, FNP  pantoprazole (PROTONIX) 40 MG tablet Take 1 tablet (40 mg total) by mouth daily. Patient not taking: No sig reported 06/20/21 06/20/22  Caryn Section Linden Dolin, PA-C  potassium chloride (KLOR-CON) 10 MEQ tablet Take 1 tablet (10 mEq total) by mouth daily. Patient not taking: No sig reported 04/18/21   Letitia Neri L, PA-C  traMADol (ULTRAM) 50 MG tablet TAKE 1 TABLET BY MOUTH EVERY 6 HOURS AS NEEDED. Patient not taking: No sig reported 08/02/21   Jerrol Banana., MD  triamcinolone ointment (KENALOG) 0.5 % Apply 1 application topically 2  (two) times daily. Patient not taking: No sig reported 07/21/21   Tally Joe T, FNP  Vaginal Lubricant (REPLENS) GEL Place 1 Applicatorful vaginally 3 (three) times a week. 11/30/20   Gae Dry, MD    Allergies Lotrel [amlodipine besy-benazepril hcl], Contrast media [iodinated diagnostic agents], Niacin, and Sulfa antibiotics  Family History  Problem Relation Age of Onset   Cancer Father 12       Lung   Coronary artery disease Father 39   COPD Mother    Diabetes Mother    Hypertension Mother    Cancer Paternal Grandmother        Breast   Breast cancer Paternal Grandmother    Congestive Heart Failure Maternal Grandmother    Emphysema Maternal Grandfather    Heart attack Paternal Grandfather    Cancer Paternal Aunt        Breast   Breast cancer Paternal Aunt 77   Breast cancer Paternal Aunt 39    Social History Social History   Tobacco Use   Smoking status: Never   Smokeless tobacco: Never  Vaping Use   Vaping Use: Never used  Substance Use Topics   Alcohol use: No   Drug use: No    Review of Systems  Review of Systems  Unable to perform ROS: Mental status change    ____________________________________________  PHYSICAL EXAM:      VITAL SIGNS: ED Triage Vitals  Enc Vitals Group     BP 09/05/21 1410 105/86     Pulse Rate 09/05/21 1410 70     Resp 09/05/21 1410 18     Temp --      Temp src --      SpO2 09/05/21 1410 98 %     Weight 09/05/21 1422 133 lb 13.1 oz (60.7 kg)     Height 09/05/21 1422 5\' 4"  (1.626 m)     Head Circumference --      Peak Flow --      Pain Score --      Pain Loc --      Pain Edu? --      Excl. in [place]? --      Physical Exam Vitals and nursing note reviewed.  Constitutional:      General: She is not in acute distress.    Appearance: She is well-developed.  HENT:  Head: Normocephalic and atraumatic.  Eyes:     Conjunctiva/sclera: Conjunctivae normal.  Cardiovascular:     Rate and Rhythm: Normal rate and regular  rhythm.     Heart sounds: Normal heart sounds.  Pulmonary:     Effort: Pulmonary effort is normal. No respiratory distress.     Breath sounds: No wheezing.  Abdominal:     General: There is no distension.  Musculoskeletal:     Cervical back: Neck supple.  Skin:    General: Skin is warm.     Capillary Refill: Capillary refill takes less than 2 seconds.     Findings: No rash.  Neurological:     Mental Status: She is alert.     Motor: No abnormal muscle tone.     Comments: Disoriented.  Slightly confused.  Right-sided neglect noted as well as right facial droop.  Strength 4-5 on the right upper and lower extremity.  Diminished sensation.      ____________________________________________   LABS (all labs ordered are listed, but only abnormal results are displayed)  Labs Reviewed  CBC - Abnormal; Notable for the following components:      Result Value   RBC 3.69 (*)    Hemoglobin 11.1 (*)    HCT 33.7 (*)    All other components within normal limits  COMPREHENSIVE METABOLIC PANEL - Abnormal; Notable for the following components:   Potassium 3.3 (*)    Glucose, Bld 332 (*)    Creatinine, Ser 1.39 (*)    Calcium 8.6 (*)    Total Protein 6.4 (*)    Albumin 3.3 (*)    Alkaline Phosphatase 152 (*)    GFR, Estimated 41 (*)    All other components within normal limits  CBG MONITORING, ED - Abnormal; Notable for the following components:   Glucose-Capillary 188 (*)    All other components within normal limits  RESP PANEL BY RT-PCR (FLU A&B, COVID) ARPGX2  PROTIME-INR  APTT  DIFFERENTIAL  URINE DRUG SCREEN, QUALITATIVE ([facility] ONLY)  URINALYSIS, ROUTINE W REFLEX MICROSCOPIC  ETHANOL    ____________________________________________  EKG: Normal sinus rhythm, ventricular rate 73.  PR 132, QRS 84, QTc 436.  No acute ST elevations or depressions.  No acute events of acute ischemia or infarct. ________________________________________  RADIOLOGY All imaging, including plain films,  CT scans, and ultrasounds, independently reviewed by me, and interpretations confirmed via formal radiology reads.  ED MD interpretation:   CT head: No acute intracranial abnormality  Official radiology report(s): CT HEAD CODE STROKE WO CONTRAST`  Result Date: 09/05/2021 CLINICAL DATA:  Code stroke. Neuro deficit, acute, stroke suspected. Aphasia. Unresponsive. EXAM: CT HEAD WITHOUT CONTRAST TECHNIQUE: Contiguous axial images were obtained from the base of the skull through the vertex without intravenous contrast. COMPARISON:  08/30/2021 CT.  02/18/2021 MRI. FINDINGS: Brain: No focal abnormality seen affecting the brainstem or cerebellum. Cerebral hemispheres show minimal small vessel change of the white matter. No sign of acute infarction, mass lesion, hemorrhage, hydrocephalus or extra-axial collection. Vascular: There is atherosclerotic calcification of the major vessels at the base of the brain. Skull: Negative Sinuses/Orbits: Clear/normal Other: None ASPECTS ([place] Stroke Program Early CT Score) - Ganglionic level infarction (caudate, lentiform nuclei, internal capsule, insula, M1-M3 cortex): 7 - Supraganglionic infarction (M4-M6 cortex): 3 Total score (0-10 with 10 being normal): 10 IMPRESSION: 1. No acute brain finding. Minimal small-vessel change of the cerebral hemispheric white matter. 2. ASPECTS is 10. 3. These results were called by telephone at the time of interpretation  on 09/05/2021 at 1:44 pm to provider Julie Acevedo , who verbally acknowledged these results. Electronically Signed   By: Nelson Chimes M.D.   On: 09/05/2021 13:47    ____________________________________________  PROCEDURES   Procedure(s) performed (including Critical Care):  .Critical Care Performed by: Duffy Bruce, MD Authorized by: Duffy Bruce, MD   Critical care provider statement:    Critical care time (minutes):  35   Critical care time was exclusive of:  Separately billable procedures and treating  other patients and teaching time   Critical care was necessary to treat or prevent imminent or life-threatening deterioration of the following conditions:  CNS failure or compromise, respiratory failure and cardiac failure   Critical care was time spent personally by me on the following activities:  Development of treatment plan with patient or surrogate, discussions with consultants, evaluation of patient's response to treatment, examination of patient, obtaining history from patient or surrogate, ordering and performing treatments and interventions, ordering and review of laboratory studies, ordering and review of radiographic studies, pulse oximetry, re-evaluation of patient's condition and review of old charts   I assumed direction of critical care for this patient from another provider in my specialty: no    ____________________________________________  INITIAL IMPRESSION / MDM / [place] / ED COURSE  As part of my medical decision making, I reviewed the following data within the [place] notes reviewed and incorporated, Old chart reviewed, Notes from prior ED visits, and [place] Controlled Substance Database       *SHAYLYN BAWA was evaluated in Emergency Department on 09/05/2021 for the symptoms described in the history of present illness. She was evaluated in the context of the global COVID-19 pandemic, which necessitated consideration that the patient might be at risk for infection with the SARS-CoV-2 virus that causes COVID-19. Institutional protocols and algorithms that pertain to the evaluation of patients at risk for COVID-19 are in a state of rapid change based on information released by regulatory bodies including the CDC and federal and state organizations. These policies and algorithms were followed during the patient's care in the ED.  Some ED evaluations and interventions may be delayed as a result of limited staffing during the pandemic.*      Medical Decision Making: 70 year old female here with acute altered mental status and right-sided weakness.  This occurs in the setting of hypoglycemia, but persistent focal deficits noted despite improvement in blood sugar.  Neurology at bedside, has decided to give tPA.  Patient has no other obvious contraindications.  Patient does remain mildly confused with focal deficits on my serial exam.  Husband at bedside and risks and benefits discussed.  Patient will be admitted to the ICU.  Differential includes transient hypoglycemia, possible seizure with postictal paralysis.  CT head negative.  ____________________________________________  FINAL CLINICAL IMPRESSION(S) / ED DIAGNOSES  Final diagnoses:  Stroke-like symptom  Hypoglycemia     MEDICATIONS GIVEN DURING THIS VISIT:  Medications  tenecteplase (TNKASE) injection for Stroke 15 mg (15 mg Intravenous Given 09/05/21 1451)     ED Discharge Orders     None        Note:  This document was prepared using Dragon voice recognition software and may include unintentional dictation errors.   Duffy Bruce, MD 09/05/21 [id number]

## 2021-09-05 NOTE — ED Notes (Signed)
Called lab to get phlebotomy to assist with drawing ck as neither of pt's IV's pulling back enough blood. Fluids currently running through L ac IV.

## 2021-09-05 NOTE — ED Notes (Signed)
Messaged provider H. Damita Dunnings about pt's continued stroke-like symptoms and notification of passing swallow screen in relation to confirmation on whether pt allowed to eat yet or not as her BG drops when not eating regularly. Awaiting reply/orders.

## 2021-09-05 NOTE — ED Notes (Signed)
Pt back from MRI. Visitor back to bedside.

## 2021-09-05 NOTE — ED Notes (Signed)
Neuro at bedside.

## 2021-09-05 NOTE — Final Progress Note (Signed)
Patient brought back to Endo preop for outpatient upper endoscopy. Patient alert, oriented and answers questions. Upon checking blood sugar, noted blood sugar is 44. Immediately helped patient get onto stretcher. She said she was not feeling well. Dr. Rosey Bath notified of blood sugar and D50 IV ordered. Poor visualization of veins, so trying to get IV started and noted patient was unresponsive, but has a pulse and is breathing.  IV access obtained, rapid response called and D50 adm IV. Dr. Rosey Bath in room/ IV fluids changed to D51/2 NS wide open. Patient says she has not taken any medication today or eaten or drank anything in preparation for her EGD. VSS, see flowsheet. Skin w/d. Husband brought back to endo and explained to him what had happened. He said she gets like this when she has a low blood sugar, but then said she hasn't had a low blood sugar in a while. He also said she has lost a lot of weight since her "stomach" surgery. Blood sugar came up to 105 just before proceding to CT for stat CT scan. Opens eyes when name called but unable to follow commands. Report given to Code Stroke team in CT and ED. Husband assisted to ED waiting room and staff notified that husband in waiting room.

## 2021-09-05 NOTE — Consult Note (Signed)
NEURO HOSPITALIST CONSULT NOTE   Requesting physician: Dr. Ellender Hose  Reason for Consult: Acute onset of right sided weakness  History obtained from:  Husband and Chart     HPI:                                                                                                                                          Julie Acevedo is an 70 y.o. female with a history of HTN, constipation, DM II, syncope, dementia (possible [name]), lumbosacral radiculopathy, prior roux-en-y gastric bypass, anxiety and depression who presents to the ED from outpatient clinic for assessment of acute onset of right-sided weakness. Was here for EGD today.to assess anorexia and early satiety. CBG was 44 prior to scheduled EGD and an amp of D50 was administered. At the time of the low CBG reading, the patient was at her normal baseline. She then decompensated. LKN was 1300, which is the same as time of symptom onset. She suddenly was observed to have decreased responsiveness. Her CBG was taken again and it was noted that it had improved in the context of her new symptoms and therefore she was screened for a stroke. She was noted to have a right facial droop and RUE/RLE weakness. A Code Stroke was called and she was brought to the ED for emergent CT.   Per RN note from the Endoscopy: "Patient brought back to Endo preop for outpatient upper endoscopy. Patient alert, oriented and answers questions. Upon checking blood sugar, noted blood sugar is 44. Immediately helped patient get onto stretcher. She said she was not feeling well. Dr. Rosey Bath notified of blood sugar and D50 IV ordered. Poor visualization of veins, so trying to get IV started and noted patient was unresponsive, but has a pulse and is breathing.  IV access obtained, rapid response called and D50 adm IV. Dr. Rosey Bath in room/ IV fluids changed to D51/2 NS wide open. Patient says she has not taken any medication today or eaten or drank anything  in preparation for her EGD. VSS, see flowsheet. Skin w/d. Husband brought back to endo and explained to him what had happened. He said she gets like this when she has a low blood sugar, but then said she hasn't had a low blood sugar in a while. He also said she has lost a lot of weight since her "stomach" surgery. Blood sugar came up to 105 just before proceding to CT for stat CT scan. Opens eyes when name called but unable to follow commands. Report given to Code Stroke team in CT and ED. Husband assisted to ED waiting room and staff notified that husband in waiting room."   Not on a blood thinner.   Past Medical History:  Diagnosis Date   Anemia  Arthritis    COVID-19 11/19/2019   Diverticulitis    DM (diabetes mellitus) ([place])    typ e 2   History of methicillin resistant staphylococcus aureus (MRSA) 2017   HTN (hypertension)    Hyperlipidemia    Memory difficulties 09/01/2015   Multiple thyroid nodules    Myocardial infarction ([place]) 1995   Short-term memory loss    Thyroid nodule     Past Surgical History:  Procedure Laterality Date   ABDOMINAL HYSTERECTOMY     due to endometriosis-1 ovary left   BREAST EXCISIONAL BIOPSY Right 1971   neg   CARDIAC CATHETERIZATION  2000   no significant CAD per note of Dr Caryl Comes in 2013    [place] N/A 03/19/2017   Procedure: ANTERIOR REPAIR (CYSTOCELE);  Surgeon: Bjorn Loser, MD;  Location: [facility];  Service: Urology;  Laterality: N/A;   CYSTOSCOPY N/A 03/19/2017   Procedure: CYSTOSCOPY;  Surgeon: Bjorn Loser, MD;  Location: [facility];  Service: Urology;  Laterality: N/A;   EYE SURGERY     FOOT SURGERY     HAND SURGERY     HEMICOLECTOMY     INSERTION OF MESH N/A 08/21/2016   Procedure: INSERTION OF MESH;  Surgeon: Excell Seltzer, MD;  Location: [facility];  Service: General;  Laterality: N/A;   INSERTION OF MESH N/A 12/08/2019   Procedure: INSERTION OF MESH;  Surgeon: Jules Husbands, MD;  Location: [facility];  Service: General;  Laterality: N/A;   LAPAROSCOPIC LYSIS OF ADHESIONS N/A 08/21/2016   Procedure: LAPAROSCOPIC LYSIS OF ADHESIONS;  Surgeon: Excell Seltzer, MD;  Location: [facility];  Service: General;  Laterality: N/A;   LAPAROSCOPIC REMOVAL ABDOMINAL MASS     LAPAROTOMY N/A 04/16/2019   Procedure: EXPLORATORY LAPAROTOMY;  Surgeon: Benjamine Sprague, DO;  Location: [facility];  Service: General;  Laterality: N/A;   LOOP RECORDER INSERTION N/A 03/21/2018   Procedure: LOOP RECORDER INSERTION;  Surgeon: Sanda Klein, MD;  Location: [facility];  Service: Cardiovascular;  Laterality: N/A;   LUMBAR DISC SURGERY     ROUX-EN-Y GASTRIC BYPASS  2010   Dr Hassell Done   SMALL INTESTINE SURGERY     TONSILLECTOMY     UPPER GI ENDOSCOPY  01/12/2016   normal larynx, normal esophagus. gastric bypass with a normal-sized pouch and intact staple line, normal examined jejunum, otherwise exam was normal   VENTRAL HERNIA REPAIR N/A 08/21/2016   Procedure: LAPAROSCOPIC VENTRAL HERNIA;  Surgeon: Excell Seltzer, MD;  Location: [facility];  Service: General;  Laterality: N/A;   XI ROBOTIC ASSISTED VENTRAL HERNIA N/A 12/08/2019   Procedure: XI ROBOTIC ASSISTED VENTRAL HERNIA;  Surgeon: Jules Husbands, MD;  Location: [facility];  Service: General;  Laterality: N/A;    Family History  Problem Relation Age of Onset   Cancer Father 63       Lung   Coronary artery disease Father 49   COPD Mother    Diabetes Mother    Hypertension Mother    Cancer Paternal Grandmother        Breast   Breast cancer Paternal Grandmother    Congestive Heart Failure Maternal Grandmother    Emphysema Maternal Grandfather    Heart attack Paternal Grandfather    Cancer Paternal Aunt        Breast   Breast cancer Paternal Aunt 29   Breast cancer Paternal Aunt 72              Social History:  reports that she has never smoked. She has never used smokeless tobacco. She reports that she does not drink alcohol and does not use  drugs.  Allergies  Allergen Reactions   Lotrel [Amlodipine Besy-Benazepril Hcl] Anaphylaxis   Contrast Media [Iodinated Diagnostic Agents] Swelling and Other (See Comments)    Reaction:  Neck swelling    Niacin Hives   Sulfa Antibiotics Hives    MEDICATIONS:                                                                                                                      No current facility-administered medications on file prior to encounter.   Current Outpatient Medications on File Prior to Encounter  Medication Sig Dispense Refill   calcitRIOL (ROCALTROL) 0.25 MCG capsule Take 0.25 mcg by mouth daily.     cyclobenzaprine (FLEXERIL) 5 MG tablet TAKE 1 TABLET BY MOUTH THREE TIMES A DAY AS NEEDED 60 tablet 3   gabapentin (NEURONTIN) 100 MG capsule TAKE 2 CAPSULES (200 MG TOTAL) BY MOUTH 3 (THREE) TIMES DAILY. (Patient taking differently: Take 200 mg by mouth 2 (two) times daily.) 540 capsule 1   hydrochlorothiazide (HYDRODIURIL) 25 MG tablet Take 1 tablet (25 mg total) by mouth daily. 90 tablet 1   LINZESS 290 MCG CAPS capsule TAKE 1 CAPSULE BY MOUTH DAILY BEFORE BREAKFAST. 90 capsule 0   Melatonin 10 MG CAPS Take 10 mg by mouth at bedtime as needed.     metFORMIN (GLUCOPHAGE) 500 MG tablet TAKE 1 TABLET (500 MG TOTAL) BY MOUTH 2 (TWO) TIMES DAILY WITH A MEAL. 180 tablet 3   metoprolol succinate (TOPROL-XL) 25 MG 24 hr tablet Take 1 tablet (25 mg total) by mouth daily. 90 tablet 1   ondansetron (ZOFRAN) 4 MG tablet Take 1 tablet (4 mg total) by mouth every 8 (eight) hours as needed. 20 tablet 0   polyethylene glycol powder (GLYCOLAX/MIRALAX) 17 GM/SCOOP powder Take 17 g by mouth daily as needed for moderate constipation. 255 g 0   rivastigmine (EXELON) 3 MG capsule Take 3 mg by mouth 2 (two) times daily.     traMADol (ULTRAM) 50 MG tablet Take 1 tablet (50 mg total) by mouth every 6 (six) hours as needed. 100 tablet 2   traZODone (DESYREL) 50 MG tablet TAKE 1 TABLET (50 MG TOTAL) BY  MOUTH AT BEDTIME AS NEEDED. 90 tablet 4   magic mouthwash (lidocaine, diphenhydrAMINE, alum & mag hydroxide) suspension Swish and swallow 5 mLs 4 (four) times daily as needed for mouth pain. (Patient not taking: No sig reported) 360 mL 0   pantoprazole (PROTONIX) 40 MG tablet Take 1 tablet (40 mg total) by mouth daily. (Patient not taking: No sig reported) 30 tablet 1   potassium chloride (KLOR-CON) 10 MEQ tablet Take 1 tablet (10 mEq total) by mouth daily. (Patient not taking: No sig reported) 7 tablet 0   traMADol (ULTRAM) 50 MG tablet TAKE 1 TABLET BY MOUTH EVERY 6 HOURS AS NEEDED. (Patient not taking:  No sig reported) 100 tablet 2   triamcinolone ointment (KENALOG) 0.5 % Apply 1 application topically 2 (two) times daily. (Patient not taking: No sig reported) 30 g 0   Vaginal Lubricant (REPLENS) GEL Place 1 Applicatorful vaginally 3 (three) times a week. 35 g 11     ROS:                                                                                                                                       Unable to obtain due to AMS.    Blood pressure 118/65, pulse 63, resp. rate 14, height 5\' 4"  (1.626 m), weight 60.7 kg, SpO2 100 %.   General Examination:                                                                                                       Physical Exam  HEENT-  [place]/AT    Lungs- Respirations unlabored Ext: No edema   Neurological Examination Mental Status: Awake with decreased level of alertness. Impaired attention. Speech is slow and mildly dysarthric with increased latencies of verbal responses. Also with increased latencies of motor responses to commands. Oriented to the hospital, day of week, month, year and state, but had difficulty recalling the city that she is in. Naming intact for common items. Repetition impaired. Able to follow all simple commands.  Cranial Nerves: II: Due to depressed mentation, unable to formally evaluated visual fields. Blinks to threat  bilaterally.  III,IV, VI: Mild left gaze preference at times, but able to cross midline to right during assessment of visual pursuits. No ptosis.  V: Reacts to tactile stimulation bilaterally. States she feels a cool stimulus less on the right.  VII: Right facial droop, lower quadrant.  VIII: hearing intact for basic questions and commands IX,X: Hypophonic speech XI: Head tends to be preferentially rotated to the left, but will turn to the right when a stimulus is presented on her right side XII: Midline tongue extension Motor: RUE and RLE 4/5 LUE and LLE 5/5 Sensory: Decreased temp sensation to RUE and RLE Deep Tendon Reflexes: 2+ and symmetric throughout Plantars: Equivocal bilaterally.  Cerebellar: Mild ataxia with right FNF in the context of her weakness. FNF on left without ataxia. Slow movements bilaterally, right worse than left.  Gait: Deferred   Lab Results: Basic Metabolic Panel: Recent Labs  Lab 09/05/21 1414  NA 137  K 3.3*  CL 100  CO2 28  GLUCOSE 332*  BUN 17  CREATININE  1.39*  CALCIUM 8.6*    CBC: Recent Labs  Lab 09/05/21 1414  WBC 5.7  NEUTROABS 3.7  HGB 11.1*  HCT 33.7*  MCV 91.3  PLT 237    Cardiac Enzymes: No results for input(s): CKTOTAL, CKMB, CKMBINDEX, TROPONINI in the last 168 hours.  Lipid Panel: No results for input(s): CHOL, TRIG, HDL, CHOLHDL, VLDL, LDLCALC in the last 168 hours.  Imaging: CT HEAD CODE STROKE WO CONTRAST`  Result Date: 09/05/2021 CLINICAL DATA:  Code stroke. Neuro deficit, acute, stroke suspected. Aphasia. Unresponsive. EXAM: CT HEAD WITHOUT CONTRAST TECHNIQUE: Contiguous axial images were obtained from the base of the skull through the vertex without intravenous contrast. COMPARISON:  08/30/2021 CT.  02/18/2021 MRI. FINDINGS: Brain: No focal abnormality seen affecting the brainstem or cerebellum. Cerebral hemispheres show minimal small vessel change of the white matter. No sign of acute infarction, mass lesion,  hemorrhage, hydrocephalus or extra-axial collection. Vascular: There is atherosclerotic calcification of the major vessels at the base of the brain. Skull: Negative Sinuses/Orbits: Clear/normal Other: None ASPECTS ([place] Stroke Program Early CT Score) - Ganglionic level infarction (caudate, lentiform nuclei, internal capsule, insula, M1-M3 cortex): 7 - Supraganglionic infarction (M4-M6 cortex): 3 Total score (0-10 with 10 being normal): 10 IMPRESSION: 1. No acute brain finding. Minimal small-vessel change of the cerebral hemispheric white matter. 2. ASPECTS is 10. 3. These results were called by telephone at the time of interpretation on 09/05/2021 at 1:44 pm to provider Martha Clan , who verbally acknowledged these results. Electronically Signed   By: Nelson Chimes M.D.   On: 09/05/2021 13:47    Assessment: 1. Exam reveals findings best localizable to the MCA territory of the left cerebral hemisphere. Findings are not consistent with LVO.  2. CT head: No acute brain finding. Minimal small-vessel change of the cerebral hemispheric white matter. ASPECTS is 10 3. After comprehensive review of possible contraindications, she has no absolute contraindications to TNK administration. Patient is an IV thrombolysis candidate. Discussed extensively the risks/benefits of IV thrombolysis treatment vs. no treatment with the patient's husband, including risks of hemorrhage and death with IV thrombolysis administration versus worse overall outcomes on average in patients within thrombolysis time window who are not administered TNK. The patient's cognitive impairment and dysphasia preclude meaningful medical decision making on her part at this time. Overall benefits of TNK regarding long-term prognosis are felt to outweigh risks. The patient's husband expressed understanding and wish to proceed with TNK.  4. History of cognitive impairment, suspected to be secondary to possible [name]. 5. History of seizure in  the past. Not on an anticonvulsant. No seizure activity with today's presentation.  6. History of hypoglycemic episodes with AMS. Husband states that she has never had focal weakness with one of her hypoglycemic episodes.   Recommendations: 1. Admitting to Neuro ICU.  2. Post-TNK order set to include frequent neuro checks and BP management.  3. No antiplatelet medications or anticoagulants for at least 24 hours following TNK.  4. DVT prophylaxis with SCDs.  5. Will need to be started on a statin. Obtain baseline CK level.  6. Will need to be started on antiplatelet therapy if follow up CT at 24 hours is negative for hemorrhagic conversion. 7. Telemetry monitoring 8. TTE.  9. MRI brain with MRA of head if cleared by Cardiology. She has a history of loop recorder insertion in 2019.  10. Carotid ultrasound 11. NPO until passes swallow evaluation.  12. Sliding scale insulin.  13. Fasting lipid panel,  HgbA1c 15. Continue rivastigmine  16. PT/OT/Speech.   Electronically signed: Dr. Kerney Elbe 09/05/2021, 4:03 PM

## 2021-09-05 NOTE — ED Notes (Signed)
Attempted report to ICU.

## 2021-09-05 NOTE — ED Notes (Signed)
Unable to contact family

## 2021-09-05 NOTE — ED Notes (Signed)
Messaged provider [facility] [place] [facility] via secure chat about if pt can get a diet order or if needs to remain NPO.

## 2021-09-05 NOTE — Progress Notes (Signed)
CODE STROKE- PHARMACY COMMUNICATION   Time CODE STROKE called/page received:1347  Time response to CODE STROKE was made (in person or via phone):1355   Time Stroke Kit retrieved from [facility] (only if needed):1350  *TNK indicated and given*  Past Medical History:  Diagnosis Date   Anemia    Arthritis    COVID-19 11/19/2019   Diverticulitis    DM (diabetes mellitus) ([place])    typ e 2   History of methicillin resistant staphylococcus aureus (MRSA) 2017   HTN (hypertension)    Hyperlipidemia    Memory difficulties 09/01/2015   Multiple thyroid nodules    Myocardial infarction ([place]) 1995   Short-term memory loss    Thyroid nodule    Prior to Admission medications   Medication Sig Start Date End Date Taking? Authorizing Provider  calcitRIOL (ROCALTROL) 0.25 MCG capsule Take 0.25 mcg by mouth daily.   Yes [provider]  cyclobenzaprine (FLEXERIL) 5 MG tablet TAKE 1 TABLET BY MOUTH THREE TIMES A DAY AS NEEDED 08/02/21  Yes Jerrol Banana., MD  gabapentin (NEURONTIN) 100 MG capsule TAKE 2 CAPSULES (200 MG TOTAL) BY MOUTH 3 (THREE) TIMES DAILY. Patient taking differently: Take 200 mg by mouth 2 (two) times daily. 01/24/21  Yes Jerrol Banana., MD  hydrochlorothiazide (HYDRODIURIL) 25 MG tablet Take 1 tablet (25 mg total) by mouth daily. 04/06/21  Yes Jerrol Banana., MD  LINZESS 290 MCG CAPS capsule TAKE 1 CAPSULE BY MOUTH DAILY BEFORE BREAKFAST. 06/30/21  Yes Jerrol Banana., MD  Melatonin 10 MG CAPS Take 10 mg by mouth at bedtime as needed.   Yes [provider]  metFORMIN (GLUCOPHAGE) 500 MG tablet TAKE 1 TABLET (500 MG TOTAL) BY MOUTH 2 (TWO) TIMES DAILY WITH A MEAL. 01/10/21  Yes Jerrol Banana., MD  metoprolol succinate (TOPROL-XL) 25 MG 24 hr tablet Take 1 tablet (25 mg total) by mouth daily. 04/06/21  Yes Jerrol Banana., MD  ondansetron (ZOFRAN) 4 MG tablet Take 1 tablet (4 mg total) by mouth every 8 (eight) hours as needed.  03/20/21  Yes Pabon, Diego F, MD  polyethylene glycol powder (GLYCOLAX/MIRALAX) 17 GM/SCOOP powder Take 17 g by mouth daily as needed for moderate constipation. 04/04/21  Yes Harvest Dark, MD  rivastigmine (EXELON) 3 MG capsule Take 3 mg by mouth 2 (two) times daily.   Yes [provider]  traMADol (ULTRAM) 50 MG tablet Take 1 tablet (50 mg total) by mouth every 6 (six) hours as needed. 08/02/21  Yes Jerrol Banana., MD  traZODone (DESYREL) 50 MG tablet TAKE 1 TABLET (50 MG TOTAL) BY MOUTH AT BEDTIME AS NEEDED. 08/23/21  Yes Jerrol Banana., MD  magic mouthwash (lidocaine, diphenhydrAMINE, alum & mag hydroxide) suspension Swish and swallow 5 mLs 4 (four) times daily as needed for mouth pain. Patient not taking: No sig reported 07/21/21   Tally Joe T, FNP  pantoprazole (PROTONIX) 40 MG tablet Take 1 tablet (40 mg total) by mouth daily. Patient not taking: No sig reported 06/20/21 06/20/22  Caryn Section Linden Dolin, PA-C  potassium chloride (KLOR-CON) 10 MEQ tablet Take 1 tablet (10 mEq total) by mouth daily. Patient not taking: No sig reported 04/18/21   Letitia Neri L, PA-C  traMADol (ULTRAM) 50 MG tablet TAKE 1 TABLET BY MOUTH EVERY 6 HOURS AS NEEDED. Patient not taking: No sig reported 08/02/21   Jerrol Banana., MD  triamcinolone ointment (KENALOG) 0.5 % Apply 1  application topically 2 (two) times daily. Patient not taking: No sig reported 07/21/21   Tally Joe T, FNP  Vaginal Lubricant (REPLENS) GEL Place 1 Applicatorful vaginally 3 (three) times a week. 11/30/20   Gae Dry, MD  amLODipine (NORVASC) 10 MG tablet Take 10 mg by mouth daily.  09/05/21  [provider]  methocarbamol (ROBAXIN) 500 MG tablet Take 500 mg by mouth 2 (two) times daily.  09/05/21  [provider]  omeprazole (PRILOSEC) 20 MG capsule Take 20 mg by mouth daily. Patient not taking: Reported on 09/05/2021  09/05/21  [provider]     Rodriguez-Guzman PharmD,  BCPS 09/05/2021 2:56 PM

## 2021-09-05 NOTE — ED Triage Notes (Addendum)
Pt to ED while getting preop for inpatient EGD. Pt stating she didn't feel good and CBG checked and was 40. Pt given amp D50. Staff stating pt started dazing off and acting strange. Staff stating LOC and loss of tone. Pt altered at this time. Pt with no hx stroke. Pt with facial droop and right sided deficits. LKW 1300. Pt having difficulty finding words. Pt not on blood thinners

## 2021-09-05 NOTE — ED Notes (Signed)
Pt currently denying nausea.

## 2021-09-05 NOTE — ED Notes (Signed)
Called MRI staff. Pt remains at MRI as another exam was ordered by doc. MRI staff report pt continues at same neuro baseline as when she left ER for MRI.

## 2021-09-05 NOTE — ED Notes (Signed)
Pt assisted to use bedpan. Assisted with peri care. Pt urinated. Sample sent to lab.

## 2021-09-05 NOTE — ED Notes (Signed)
Phlebotomist at bedside. Xray staff at bedside.

## 2021-09-05 NOTE — H&P (Signed)
Outpatient short stay form Pre-procedure 09/05/2021  Lesly Rubenstein, MD  Primary Physician: Jerrol Banana., MD  Reason for visit:  Early Satiety  History of present illness:   70 y/o lady with history of hypertension, constipation, and DM II here for EGD for early satiety. No blood thinners. History of roux-en-y gastric bypass. No family history of GI malignancies.     Current Facility-Administered Medications:    0.9 %  sodium chloride infusion, , Intravenous, Continuous, Shantay Sonn, Hilton Cork, MD   dextrose 50 % solution 50 mL, 1 ampule, Intravenous, Once, Martha Clan, MD   dextrose 50 % solution, , , ,   Medications Prior to Admission  Medication Sig Dispense Refill Last Dose   amLODipine (NORVASC) 10 MG tablet Take 10 mg by mouth daily.   09/04/2021   cyclobenzaprine (FLEXERIL) 5 MG tablet TAKE 1 TABLET BY MOUTH THREE TIMES A DAY AS NEEDED 60 tablet 3 09/04/2021   hydrochlorothiazide (HYDRODIURIL) 25 MG tablet Take 1 tablet (25 mg total) by mouth daily. (Patient taking differently: Take 12.5 mg by mouth daily.) 90 tablet 1 09/04/2021   LINZESS 290 MCG CAPS capsule TAKE 1 CAPSULE BY MOUTH DAILY BEFORE BREAKFAST. 90 capsule 0 09/04/2021   Melatonin 10 MG CAPS Take 10 mg by mouth at bedtime as needed.      metFORMIN (GLUCOPHAGE) 500 MG tablet TAKE 1 TABLET (500 MG TOTAL) BY MOUTH 2 (TWO) TIMES DAILY WITH A MEAL. (Patient taking differently: Take 500 mg by mouth daily with breakfast.) 180 tablet 3 09/04/2021   methocarbamol (ROBAXIN) 500 MG tablet Take 500 mg by mouth 2 (two) times daily. Back pain   09/04/2021   metoprolol succinate (TOPROL-XL) 25 MG 24 hr tablet Take 1 tablet (25 mg total) by mouth daily. 90 tablet 1 09/04/2021   traMADol (ULTRAM) 50 MG tablet TAKE 1 TABLET BY MOUTH EVERY 6 HOURS AS NEEDED. 100 tablet 2 09/04/2021   calcitRIOL (ROCALTROL) 0.25 MCG capsule Take 0.25 mcg by mouth daily.      gabapentin (NEURONTIN) 100 MG capsule TAKE 2 CAPSULES (200 MG TOTAL) BY  MOUTH 3 (THREE) TIMES DAILY. (Patient taking differently: Take 200 mg by mouth 2 (two) times daily.) 540 capsule 1    magic mouthwash (lidocaine, diphenhydrAMINE, alum & mag hydroxide) suspension Swish and swallow 5 mLs 4 (four) times daily as needed for mouth pain. (Patient not taking: Reported on 09/04/2021) 360 mL 0    omeprazole (PRILOSEC) 20 MG capsule Take 20 mg by mouth daily. (Patient not taking: Reported on 09/05/2021)   Not Taking   ondansetron (ZOFRAN) 4 MG tablet Take 1 tablet (4 mg total) by mouth every 8 (eight) hours as needed. 20 tablet 0    pantoprazole (PROTONIX) 40 MG tablet Take 1 tablet (40 mg total) by mouth daily. (Patient not taking: Reported on 09/05/2021) 30 tablet 1 Not Taking   polyethylene glycol powder (GLYCOLAX/MIRALAX) 17 GM/SCOOP powder Take 17 g by mouth daily as needed for moderate constipation. 255 g 0    potassium chloride (KLOR-CON) 10 MEQ tablet Take 1 tablet (10 mEq total) by mouth daily. (Patient not taking: Reported on 09/04/2021) 7 tablet 0    rivastigmine (EXELON) 3 MG capsule Take 3 mg by mouth 2 (two) times daily.      traMADol (ULTRAM) 50 MG tablet Take 1 tablet (50 mg total) by mouth every 6 (six) hours as needed. 100 tablet 2    traZODone (DESYREL) 50 MG tablet TAKE 1 TABLET (50 MG TOTAL)  BY MOUTH AT BEDTIME AS NEEDED. 90 tablet 4    triamcinolone ointment (KENALOG) 0.5 % Apply 1 application topically 2 (two) times daily. 30 g 0    Vaginal Lubricant (REPLENS) GEL Place 1 Applicatorful vaginally 3 (three) times a week. 35 g 11      Allergies  Allergen Reactions   Lotrel [Amlodipine Besy-Benazepril Hcl] Anaphylaxis   Contrast Media [Iodinated Diagnostic Agents] Swelling and Other (See Comments)    Reaction:  Neck swelling    Niacin Hives   Sulfa Antibiotics Hives     Past Medical History:  Diagnosis Date   Anemia    Arthritis    COVID-19 11/19/2019   Diverticulitis    DM (diabetes mellitus) ([place])    typ e 2   History of methicillin resistant  staphylococcus aureus (MRSA) 2017   HTN (hypertension)    Hyperlipidemia    Memory difficulties 09/01/2015   Multiple thyroid nodules    Myocardial infarction ([place]) 1995   Short-term memory loss    Thyroid nodule     Review of systems:  Otherwise negative.    Physical Exam  Gen: Alert, oriented. Appears stated age.  HEENT: PERRLA. Lungs: No respiratory distress CV: RRR Abd: soft, benign, no masses Ext: No edema    Planned procedures: Proceed with EGD. The patient understands the nature of the planned procedure, indications, risks, alternatives and potential complications including but not limited to bleeding, infection, perforation, damage to internal organs and possible oversedation/side effects from anesthesia. The patient agrees and gives consent to proceed.  Please refer to procedure notes for findings, recommendations and patient disposition/instructions.     Lesly Rubenstein, MD [facility]

## 2021-09-06 ENCOUNTER — Inpatient Hospital Stay: Payer: Medicare HMO

## 2021-09-06 ENCOUNTER — Inpatient Hospital Stay (HOSPITAL_COMMUNITY)
Admit: 2021-09-06 | Discharge: 2021-09-06 | Disposition: A | Discharge status: Home / Self Care | Payer: Medicare HMO | Attending: Pulmonary Disease | Admitting: Pulmonary Disease

## 2021-09-06 ENCOUNTER — Encounter: Payer: Self-pay | Admitting: Pulmonary Disease

## 2021-09-06 DIAGNOSIS — I6389 Other cerebral infarction: Secondary | ICD-10-CM | POA: Diagnosis not present

## 2021-09-06 DIAGNOSIS — I639 Cerebral infarction, unspecified: Secondary | ICD-10-CM | POA: Diagnosis not present

## 2021-09-06 LAB — LIPID PANEL
Cholesterol: 134 mg/dL (ref 0–200)
HDL: 60 mg/dL (ref >40)
LDL Cholesterol: 63 mg/dL (ref 0–99)
Total CHOL/HDL Ratio: 2.2 RATIO
Triglycerides: 54 mg/dL (ref <150)
VLDL: 11 mg/dL (ref 0–40)

## 2021-09-06 LAB — GLUCOSE, CAPILLARY
Glucose-Capillary: 106 mg/dL — ABNORMAL HIGH (ref 70–99)
Glucose-Capillary: 120 mg/dL — ABNORMAL HIGH (ref 70–99)
Glucose-Capillary: 33 mg/dL — CL (ref 70–99)
Glucose-Capillary: 51 mg/dL — ABNORMAL LOW (ref 70–99)
Glucose-Capillary: 51 mg/dL — ABNORMAL LOW (ref 70–99)
Glucose-Capillary: 61 mg/dL — ABNORMAL LOW (ref 70–99)
Glucose-Capillary: 76 mg/dL (ref 70–99)
Glucose-Capillary: 80 mg/dL (ref 70–99)

## 2021-09-06 LAB — ECHOCARDIOGRAM COMPLETE
AR max vel: 2.47 cm2
AV Area VTI: 2.3 cm2
AV Area mean vel: 2.29 cm2
AV Mean grad: 3 mmHg
AV Peak grad: 5.2 mmHg
Ao pk vel: 1.14 m/s
Area-P 1/2: 2.91 cm2
Height: 64 in
MV VTI: 2.14 cm2
S' Lateral: 2.6 cm
Weight: 1837.75 oz

## 2021-09-06 LAB — MRSA NEXT GEN BY PCR, NASAL: MRSA by PCR Next Gen: NOT DETECTED

## 2021-09-06 MED ORDER — TRAMADOL HCL 50 MG PO TABS
25.0000 mg | ORAL_TABLET | Freq: Four times a day (QID) | ORAL | Status: DC | PRN
  Administered 2021-09-06 – 2021-09-08 (×5): 25 mg via ORAL
  Filled 2021-09-06 (×5): qty 1

## 2021-09-06 MED ORDER — TRAMADOL HCL 50 MG PO TABS: 25.0000 mg | ORAL_TABLET | Freq: Four times a day (QID) | ORAL | Status: DC

## 2021-09-06 MED ORDER — CHLORHEXIDINE GLUCONATE CLOTH 2 % EX PADS
6.0000 | MEDICATED_PAD | Freq: Every day | CUTANEOUS | Status: DC
  Administered 2021-09-08: 09:00:00 6 via TOPICAL

## 2021-09-06 MED ORDER — TRAMADOL 5 MG/ML ORAL SUSPENSION: 25.0000 mg | Freq: Four times a day (QID) | ORAL | Status: DC | PRN

## 2021-09-06 MED ORDER — DEXTROSE 50 % IV SOLN: 25.0000 g | INTRAVENOUS | Status: AC

## 2021-09-06 MED ORDER — TRAMADOL 5 MG/ML ORAL SUSPENSION: 25.0000 mg | Freq: Four times a day (QID) | ORAL | Status: DC

## 2021-09-06 MED ORDER — DEXTROSE IN LACTATED RINGERS 5 % IV SOLN: INTRAVENOUS | Status: DC

## 2021-09-06 MED ORDER — DEXTROSE 50 % IV SOLN
INTRAVENOUS | Status: AC
  Administered 2021-09-06: 25 g via INTRAVENOUS
  Filled 2021-09-06: qty 50

## 2021-09-06 NOTE — Progress Notes (Signed)
[place] Progress Note Patient Name: Julie Acevedo DOB: Jun 07, 1951 MRN: [id number]   Date of Service  09/06/2021  HPI/Events of Note  Periph IV in which an amp of D50 was pushed had infiltrated and was removed.  Area painful  eICU Interventions  Warm compresses and APAP     Intervention Category Intermediate Interventions: Pain - evaluation and management  Tilden Dome 09/06/2021, 5:23 AM

## 2021-09-06 NOTE — Progress Notes (Signed)
PT Cancellation Note  Patient Details Name: Julie Acevedo MRN: [id number] DOB: 07-Oct-1951   Cancelled Treatment:    Reason Eval/Treat Not Completed: Medical issues which prohibited therapy  Patient admitted to ICU for CVA work up with TNK infusion (ending at 1451, 9/21).  Per guidelines, to be on strict bedrest x24 hours post infusion and follow up imaging required to be completed prior to initiation of therapy.  Will continue to follow and initiate as medically appropriate.  Ramla Hase H. Owens Shark, PT, DPT, NCS 09/06/21, 9:34 AM [phone]

## 2021-09-06 NOTE — Progress Notes (Signed)
[place] Progress Note Patient Name: Julie Acevedo DOB: 26-Apr-1951 MRN: [id number]   Date of Service  09/06/2021  HPI/Events of Note  Pt with CVA vs. Encephalopathy related to hypoglycemia.  Received tPA  eICU Interventions  Stable in bed; no invasive devices     Intervention Category Evaluation Type: New Patient Evaluation  Tilden Dome 09/06/2021, 1:28 AM

## 2021-09-06 NOTE — Progress Notes (Signed)
[facility] Progress Note Patient Name: Julie Acevedo DOB: 01-Oct-1951 MRN: [id number]   Date of Service  09/06/2021  HPI/Events of Note  Pt hypoglycemic with a fingerstick of 51.  She is NPO  eICU Interventions  Ordered D5LR     Intervention Category Minor Interventions: Electrolytes abnormality - evaluation and management  Tilden Dome 09/06/2021, 1:17 AM

## 2021-09-06 NOTE — Progress Notes (Signed)
OT Cancellation Note  Patient Details Name: Julie Acevedo MRN: [id number] DOB: Nov 26, 1951   Cancelled Treatment:    Reason Eval/Treat Not Completed: Patient not medically ready. Pt now x24 hours post TNK infusion, however currently awaiting follow-up imaging. OT will continue to follow and initiate services as pt is medically appropriate.   Fredirick Maudlin, OTR/L [place]

## 2021-09-06 NOTE — Progress Notes (Signed)
Subjective: Speech improved today.   Objective: Current vital signs: BP 100/60   Pulse 67   Temp 98.9 F (37.2 C) (Oral)   Resp 15   Ht 5\' 4"  (1.626 m)   Wt 52.1 kg   SpO2 100%   BMI 19.72 kg/m  Vital signs in last 24 hours: Temp:  [96.6 F (35.9 C)-98.9 F (37.2 C)] 98.9 F (37.2 C) (09/21 0837) Pulse Rate:  [54-84] 67 (09/21 0837) Resp:  [12-21] 15 (09/21 0837) BP: (94-152)/(56-111) 100/60 (09/21 0800) SpO2:  [95 %-100 %] 100 % (09/21 0837) Weight:  [52.1 kg-60.7 kg] 52.1 kg (09/21 0100)  Intake/Output from previous day: 09/20 0701 - 09/21 0700 In: 419.6 [I.V.:216.4; IV Piggyback:203.2] Out: -  Intake/Output this shift: No intake/output data recorded. Nutritional status:  Diet Order             Diet regular Room service appropriate? Yes; Fluid consistency: Thin  Diet effective now                  HEENT: [place]/AT Lungs: Respirations unlabored  Neurologic Exam: Ment: Awake and alert. Speech is hypophonic and mildly slowed, but fluent with intact comprehension. Able to answer all questions normally.  CN: Fixates and tracks normally. EOMI. Smile is symmetric (improved from yesterday). Tongue protrudes midline.  Motor: 5/5 LUE and LLE 4/5 RUE 2-3/5 RLE with some effort dependence due to limitation from low back pain Tone and bulk are normal x 4.  No pronator drift.  Sensory: Subjectively decreased to FT, RUE and RLE Cerebellar: No ataxia with FNF bilaterally  Lab Results: Results for orders placed or performed during the hospital encounter of 09/05/21 (from the past 48 hour(s))  CBG monitoring, ED     Status: Abnormal   Collection Time: 09/05/21  2:13 PM  Result Value Ref Range   Glucose-Capillary 188 (H) 70 - 99 mg/dL    Comment: Glucose reference range applies only to samples taken after fasting for at least 8 hours.  Protime-INR     Status: None   Collection Time: 09/05/21  2:14 PM  Result Value Ref Range   Prothrombin Time 14.0 11.4 - 15.2 seconds    INR 1.1 0.8 - 1.2    Comment: (NOTE) INR goal varies based on device and disease states. Performed at [facility], [address] [place], [place] [address]   APTT     Status: None   Collection Time: 09/05/21  2:14 PM  Result Value Ref Range   aPTT 31 24 - 36 seconds    Comment: Performed at [facility] [address] [place], [place] [address]  CBC     Status: Abnormal   Collection Time: 09/05/21  2:14 PM  Result Value Ref Range   WBC 5.7 4.0 - 10.5 K/uL   RBC 3.69 (L) 3.87 - 5.11 MIL/uL   Hemoglobin 11.1 (L) 12.0 - 15.0 g/dL   HCT 33.7 (L) 36.0 - 46.0 %   MCV 91.3 80.0 - 100.0 fL   MCH 30.1 26.0 - 34.0 pg   MCHC 32.9 30.0 - 36.0 g/dL   RDW 13.9 11.5 - 15.5 %   Platelets 237 150 - 400 K/uL   nRBC 0.0 0.0 - 0.2 %    Comment: Performed at [facility], [address] [place],  [address]  Differential     Status: None   Collection Time: 09/05/21  2:14 PM  Result Value Ref Range   Neutrophils Relative % 66 %  Neutro Abs 3.7 1.7 - 7.7 K/uL   Lymphocytes Relative 26 %   Lymphs Abs 1.5 0.7 - 4.0 K/uL   Monocytes Relative 7 %   Monocytes Absolute 0.4 0.1 - 1.0 K/uL   Eosinophils Relative 1 %   Eosinophils Absolute 0.1 0.0 - 0.5 K/uL   Basophils Relative 0 %   Basophils Absolute 0.0 0.0 - 0.1 K/uL   Immature Granulocytes 0 %   Abs Immature Granulocytes 0.02 0.00 - 0.07 K/uL    Comment: Performed at [facility], [address] [place], [place] [address]  Comprehensive metabolic panel     Status: Abnormal   Collection Time: 09/05/21  2:14 PM  Result Value Ref Range   Sodium 137 135 - 145 mmol/L   Potassium 3.3 (L) 3.5 - 5.1 mmol/L   Chloride 100 98 - 111 mmol/L   CO2 28 22 - 32 mmol/L   Glucose, Bld 332 (H) 70 - 99 mg/dL    Comment: Glucose reference range applies only to samples taken after fasting for at least 8 hours.   BUN 17 8 - 23 mg/dL   Creatinine, Ser 1.39 (H) 0.44 - 1.00 mg/dL   Calcium 8.6 (L) 8.9 - 10.3  mg/dL   Total Protein 6.4 (L) 6.5 - 8.1 g/dL   Albumin 3.3 (L) 3.5 - 5.0 g/dL   AST 17 15 - 41 U/L   ALT 7 0 - 44 U/L   Alkaline Phosphatase 152 (H) 38 - 126 U/L   Total Bilirubin 0.6 0.3 - 1.2 mg/dL   GFR, Estimated 41 (L) >60 mL/min    Comment: (NOTE) Calculated using the CKD-EPI Creatinine Equation (2021)    Anion gap 9 5 - 15    Comment: Performed at [facility], [address] [place], [place] [address]  POC CBG, ED     Status: None   Collection Time: 09/05/21  4:41 PM  Result Value Ref Range   Glucose-Capillary 86 70 - 99 mg/dL    Comment: Glucose reference range applies only to samples taken after fasting for at least 8 hours.  Urine Drug Screen, Qualitative     Status: Abnormal   Collection Time: 09/05/21  5:00 PM  Result Value Ref Range   Tricyclic, Ur Screen POSITIVE (A) NONE DETECTED   Amphetamines, Ur Screen NONE DETECTED NONE DETECTED   MDMA (Ecstasy)Ur Screen NONE DETECTED NONE DETECTED   Cocaine Metabolite,Ur [place] NONE DETECTED NONE DETECTED   Opiate, Ur Screen NONE DETECTED NONE DETECTED   Phencyclidine (PCP) Ur S NONE DETECTED NONE DETECTED   Cannabinoid 50 Ng, Ur [place] NONE DETECTED NONE DETECTED   Barbiturates, Ur Screen NONE DETECTED NONE DETECTED   Benzodiazepine, Ur Scrn NONE DETECTED NONE DETECTED   Methadone Scn, Ur NONE DETECTED NONE DETECTED    Comment: (NOTE) Tricyclics + metabolites, urine    Cutoff 1000 ng/mL Amphetamines + metabolites, urine  Cutoff 1000 ng/mL MDMA (Ecstasy), urine              Cutoff 500 ng/mL Cocaine Metabolite, urine          Cutoff 300 ng/mL Opiate + metabolites, urine        Cutoff 300 ng/mL Phencyclidine (PCP), urine         Cutoff 25 ng/mL Cannabinoid, urine                 Cutoff 50 ng/mL Barbiturates + metabolites, urine  Cutoff 200 ng/mL Benzodiazepine, urine  Cutoff 200 ng/mL Methadone, urine                   Cutoff 300 ng/mL  The urine drug screen provides only a preliminary,  unconfirmed analytical test result and should not be used for non-medical purposes. Clinical consideration and professional judgment should be applied to any positive drug screen result due to possible interfering substances. A more specific alternate chemical method must be used in order to obtain a confirmed analytical result. Gas chromatography / mass spectrometry (GC/MS) is the preferred confirm atory method. Performed at [facility], [address] [place], [place] [address]   Urinalysis, Complete w Microscopic     Status: Abnormal   Collection Time: 09/05/21  5:00 PM  Result Value Ref Range   Color, Urine YELLOW YELLOW   APPearance CLEAR CLEAR   Specific Gravity, Urine 1.015 1.005 - 1.030   pH 7.0 5.0 - 8.0   Glucose, UA 250 (A) NEGATIVE mg/dL   Hgb urine dipstick TRACE (A) NEGATIVE   Bilirubin Urine NEGATIVE NEGATIVE   Ketones, ur NEGATIVE NEGATIVE mg/dL   Protein, ur NEGATIVE NEGATIVE mg/dL   Nitrite NEGATIVE NEGATIVE   Leukocytes,Ua NEGATIVE NEGATIVE   Squamous Epithelial / LPF 0-5 0 - 5   WBC, UA 0-5 0 - 5 WBC/hpf   RBC / HPF 0-5 0 - 5 RBC/hpf   Bacteria, UA NONE SEEN NONE SEEN   Mucus PRESENT    Hyaline Casts, UA PRESENT     Comment: Performed at [facility], [address] [place],  [address]  Resp Panel by RT-PCR (Flu A&B, Covid) Nasopharyngeal Swab     Status: None   Collection Time: 09/05/21  6:50 PM   Specimen: Nasopharyngeal Swab; Nasopharyngeal(NP) swabs in vial transport medium  Result Value Ref Range   SARS Coronavirus 2 by RT PCR NEGATIVE NEGATIVE    Comment: (NOTE) SARS-CoV-2 target nucleic acids are NOT DETECTED.  The SARS-CoV-2 RNA is generally detectable in upper respiratory specimens during the acute phase of infection. The lowest concentration of SARS-CoV-2 viral copies this assay can detect is 138 copies/mL. A negative result does not preclude SARS-Cov-2 infection and should not be used as the sole basis for  treatment or other patient management decisions. A negative result may occur with  improper specimen collection/handling, submission of specimen other than nasopharyngeal swab, presence of viral mutation(s) within the areas targeted by this assay, and inadequate number of viral copies(<138 copies/mL). A negative result must be combined with clinical observations, patient history, and epidemiological information. The expected result is Negative.  Fact Sheet for Patients:  EntrepreneurPulse.com.au  Fact Sheet for Healthcare Providers:  IncredibleEmployment.be  This test is no t yet approved or cleared by the [place] FDA and  has been authorized for detection and/or diagnosis of SARS-CoV-2 by FDA under an Emergency Use Authorization (EUA). This EUA will remain  in effect (meaning this test can be used) for the duration of the COVID-19 declaration under Section 564(b)(1) of the Act, 21 U.S.C.section 360bbb-3(b)(1), unless the authorization is terminated  or revoked sooner.       Influenza A by PCR NEGATIVE NEGATIVE   Influenza B by PCR NEGATIVE NEGATIVE    Comment: (NOTE) The Xpert Xpress SARS-CoV-2/FLU/RSV plus assay is intended as an aid in the diagnosis of influenza from Nasopharyngeal swab specimens and should not be used as a sole basis for treatment. Nasal washings and aspirates are unacceptable for Xpert Xpress SARS-CoV-2/FLU/RSV testing.  Fact Sheet  for Patients: EntrepreneurPulse.com.au  Fact Sheet for Healthcare Providers: IncredibleEmployment.be  This test is not yet approved or cleared by the [place] FDA and has been authorized for detection and/or diagnosis of SARS-CoV-2 by FDA under an Emergency Use Authorization (EUA). This EUA will remain in effect (meaning this test can be used) for the duration of the COVID-19 declaration under Section 564(b)(1) of the Act, 21 U.S.C. section  360bbb-3(b)(1), unless the authorization is terminated or revoked.  Performed at [facility], [address] [place], [place] [address]   POC CBG, ED     Status: Abnormal   Collection Time: 09/05/21  6:57 PM  Result Value Ref Range   Glucose-Capillary 40 (LL) 70 - 99 mg/dL    Comment: Glucose reference range applies only to samples taken after fasting for at least 8 hours.   Comment 1 Notify RN   Ethanol     Status: None   Collection Time: 09/05/21  7:14 PM  Result Value Ref Range   Alcohol, Ethyl (B) <10 <10 mg/dL    Comment: (NOTE) Lowest detectable limit for serum alcohol is 10 mg/dL.  For medical purposes only. Performed at [facility], [address] [place], [place] [address]   CK     Status: None   Collection Time: 09/05/21  7:14 PM  Result Value Ref Range   Total CK 58 38 - 234 U/L    Comment: Performed at [facility], [address] [place], [place] [address]  POC CBG, ED     Status: Abnormal   Collection Time: 09/05/21  7:21 PM  Result Value Ref Range   Glucose-Capillary 130 (H) 70 - 99 mg/dL    Comment: Glucose reference range applies only to samples taken after fasting for at least 8 hours.  Glucose, capillary     Status: Abnormal   Collection Time: 09/06/21 12:35 AM  Result Value Ref Range   Glucose-Capillary 51 (L) 70 - 99 mg/dL    Comment: Glucose reference range applies only to samples taken after fasting for at least 8 hours.   Comment 1 Notify RN    Comment 2 Document in Chart   MRSA Next Gen by PCR, Nasal     Status: None   Collection Time: 09/06/21 12:45 AM   Specimen: Nasal Mucosa; Nasal Swab  Result Value Ref Range   MRSA by PCR Next Gen NOT DETECTED NOT DETECTED    Comment: (NOTE) The GeneXpert MRSA Assay (FDA approved for NASAL specimens only), is one component of a comprehensive MRSA colonization surveillance program. It is not intended to diagnose MRSA infection nor to guide or monitor treatment for MRSA  infections. Test performance is not FDA approved in patients less than 3 years old. Performed at [facility] [address] [place], [place] [address]   Glucose, capillary     Status: Abnormal   Collection Time: 09/06/21  1:43 AM  Result Value Ref Range   Glucose-Capillary 61 (L) 70 - 99 mg/dL    Comment: Glucose reference range applies only to samples taken after fasting for at least 8 hours.  Glucose, capillary     Status: Abnormal   Collection Time: 09/06/21  3:19 AM  Result Value Ref Range   Glucose-Capillary 106 (H) 70 - 99 mg/dL    Comment: Glucose reference range applies only to samples taken after fasting for at least 8 hours.  Lipid panel     Status: None   Collection Time: 09/06/21  4:43 AM  Result Value Ref Range   Cholesterol 134 0 - 200 mg/dL   Triglycerides 54 <150 mg/dL   HDL 60 >40 mg/dL   Total CHOL/HDL Ratio 2.2 RATIO   VLDL 11 0 - 40 mg/dL   LDL Cholesterol 63 0 - 99 mg/dL    Comment:        Total Cholesterol/HDL:CHD Risk Coronary Heart Disease Risk Table                     Men   Women  1/2 Average Risk   3.4   3.3  Average Risk       5.0   4.4  2 X Average Risk   9.6   7.1  3 X Average Risk  23.4   11.0        Use the calculated Patient Ratio above and the CHD Risk Table to determine the patient's CHD Risk.        ATP III CLASSIFICATION (LDL):  <100     mg/dL   Optimal  100-129  mg/dL   Near or Above                    Optimal  130-159  mg/dL   Borderline  160-189  mg/dL   High  >190     mg/dL   Very High Performed at [facility], [address] [place], [place] [address]     Recent Results (from the past 240 hour(s))  Resp Panel by RT-PCR (Flu A&B, Covid) Nasopharyngeal Swab     Status: None   Collection Time: 09/05/21  6:50 PM   Specimen: Nasopharyngeal Swab; Nasopharyngeal(NP) swabs in vial transport medium  Result Value Ref Range Status   SARS Coronavirus 2 by RT PCR NEGATIVE NEGATIVE Final    Comment:  (NOTE) SARS-CoV-2 target nucleic acids are NOT DETECTED.  The SARS-CoV-2 RNA is generally detectable in upper respiratory specimens during the acute phase of infection. The lowest concentration of SARS-CoV-2 viral copies this assay can detect is 138 copies/mL. A negative result does not preclude SARS-Cov-2 infection and should not be used as the sole basis for treatment or other patient management decisions. A negative result may occur with  improper specimen collection/handling, submission of specimen other than nasopharyngeal swab, presence of viral mutation(s) within the areas targeted by this assay, and inadequate number of viral copies(<138 copies/mL). A negative result must be combined with clinical observations, patient history, and epidemiological information. The expected result is Negative.  Fact Sheet for Patients:  EntrepreneurPulse.com.au  Fact Sheet for Healthcare Providers:  IncredibleEmployment.be  This test is no t yet approved or cleared by the [place] FDA and  has been authorized for detection and/or diagnosis of SARS-CoV-2 by FDA under an Emergency Use Authorization (EUA). This EUA will remain  in effect (meaning this test can be used) for the duration of the COVID-19 declaration under Section 564(b)(1) of the Act, 21 U.S.C.section 360bbb-3(b)(1), unless the authorization is terminated  or revoked sooner.       Influenza A by PCR NEGATIVE NEGATIVE Final   Influenza B by PCR NEGATIVE NEGATIVE Final    Comment: (NOTE) The Xpert Xpress SARS-CoV-2/FLU/RSV plus assay is intended as an aid in the diagnosis of influenza from Nasopharyngeal swab specimens and should not be used as a sole basis for treatment. Nasal washings and aspirates are unacceptable for Xpert Xpress SARS-CoV-2/FLU/RSV testing.  Fact Sheet for Patients: EntrepreneurPulse.com.au  Fact Sheet for  Healthcare  Providers: IncredibleEmployment.be  This test is not yet approved or cleared by the [place] and has been authorized for detection and/or diagnosis of SARS-CoV-2 by FDA under an Emergency Use Authorization (EUA). This EUA will remain in effect (meaning this test can be used) for the duration of the COVID-19 declaration under Section 564(b)(1) of the Act, 21 U.S.C. section 360bbb-3(b)(1), unless the authorization is terminated or revoked.  Performed at [facility], [address] [place], [place] [address]   MRSA Next Gen by PCR, Nasal     Status: None   Collection Time: 09/06/21 12:45 AM   Specimen: Nasal Mucosa; Nasal Swab  Result Value Ref Range Status   MRSA by PCR Next Gen NOT DETECTED NOT DETECTED Final    Comment: (NOTE) The GeneXpert MRSA Assay (FDA approved for NASAL specimens only), is one component of a comprehensive MRSA colonization surveillance program. It is not intended to diagnose MRSA infection nor to guide or monitor treatment for MRSA infections. Test performance is not FDA approved in patients less than 74 years old. Performed at [facility], [address] [place], [place] [address]     Lipid Panel Recent Labs    09/06/21 0443  CHOL 134  TRIG 54  HDL 60  CHOLHDL 2.2  VLDL 11  LDLCALC 63    Studies/Results: MR ANGIO HEAD WO CONTRAST  Result Date: 09/05/2021 CLINICAL DATA:  Neuro deficit, acute, stroke suspected EXAM: MRA HEAD WITHOUT CONTRAST TECHNIQUE: Angiographic images of the Circle of Willis were acquired using MRA technique without intravenous contrast. COMPARISON:  No pertinent prior exam. FINDINGS: Anterior circulation: Bilateral intracranial ICAs, MCAs, and ACAS are patent without proximal mid LAD significant stenosis. No aneurysm identified. Posterior circulation: Bilateral intradural vertebral arteries basilar artery, and posterior cerebral arteries are patent without proximal  hemodynamically significant stenosis. No aneurysm identified. IMPRESSION: No large vessel occlusion or proximal hemodynamically significant stenosis. Electronically Signed   By: Margaretha Sheffield M.D.   On: 09/05/2021 18:45   [name]  Result Date: 09/05/2021 CLINICAL DATA:  Neuro deficit, acute, stroke suspected. Additional history provided: Acute right-sided weakness. EXAM: MRI HEAD WITHOUT CONTRAST TECHNIQUE: Multiplanar, multiecho pulse sequences of the brain and surrounding structures were obtained without intravenous contrast. COMPARISON:  Prior head CT examinations 09/05/2021 and earlier. Brain MRI 02/18/2021. FINDINGS: Brain: Mild generalized cerebral atrophy. Mild multifocal T2/FLAIR hyperintensity within the cerebral white matter, nonspecific but compatible with chronic small vessel ischemic disease. There is no acute infarct. No evidence of an intracranial mass. No chronic intracranial blood products. No extra-axial fluid collection. No midline shift. Vascular: Maintained flow voids within the proximal large arterial vessels. Skull and upper cervical spine: Focal suspicious marrow lesion. Sinuses/Orbits: Visualized orbits show no acute finding. No significant paranasal sinus disease. IMPRESSION: No evidence of acute intracranial abnormality. Stable noncontrast MRI appearance of the brain as compared to 02/18/2021. Mild chronic small-vessel ischemic changes within the cerebral white matter. Mild generalized cerebral atrophy. Electronically Signed   By: Kellie Simmering D.O.   On: 09/05/2021 18:08   DG Chest Port 1 View  Result Date: 09/05/2021 CLINICAL DATA:  Stroke code, low blood sugar EXAM: PORTABLE CHEST 1 VIEW COMPARISON:  03/31/2020 FINDINGS: Loop recorder overlying the left hemithorax. The heart size and mediastinal contours are within normal limits. No focal pulmonary opacity. No pleural effusion or pneumothorax. The visualized skeletal structures are unremarkable. IMPRESSION: No  active disease. Electronically Signed   By: Francetta Found.D.  On: 09/05/2021 19:50   CT HEAD CODE STROKE WO CONTRAST`  Result Date: 09/05/2021 CLINICAL DATA:  Code stroke. Neuro deficit, acute, stroke suspected. Aphasia. Unresponsive. EXAM: CT HEAD WITHOUT CONTRAST TECHNIQUE: Contiguous axial images were obtained from the base of the skull through the vertex without intravenous contrast. COMPARISON:  08/30/2021 CT.  02/18/2021 MRI. FINDINGS: Brain: No focal abnormality seen affecting the brainstem or cerebellum. Cerebral hemispheres show minimal small vessel change of the white matter. No sign of acute infarction, mass lesion, hemorrhage, hydrocephalus or extra-axial collection. Vascular: There is atherosclerotic calcification of the major vessels at the base of the brain. Skull: Negative Sinuses/Orbits: Clear/normal Other: None ASPECTS ([place] Stroke Program Early CT Score) - Ganglionic level infarction (caudate, lentiform nuclei, internal capsule, insula, M1-M3 cortex): 7 - Supraganglionic infarction (M4-M6 cortex): 3 Total score (0-10 with 10 being normal): 10 IMPRESSION: 1. No acute brain finding. Minimal small-vessel change of the cerebral hemispheric white matter. 2. ASPECTS is 10. 3. These results were called by telephone at the time of interpretation on 09/05/2021 at 1:44 pm to provider Martha Clan , who verbally acknowledged these results. Electronically Signed   By: Nelson Chimes M.D.   On: 09/05/2021 13:47    Medications: Scheduled:   stroke: mapping our early stages of recovery book   Does not apply Once   Chlorhexidine Gluconate Cloth  6 each Topical Daily   pantoprazole (PROTONIX) IV  40 mg Intravenous QHS   rivastigmine  3 mg Oral BID   Continuous:  dextrose 5% lactated ringers 50 mL/hr at 09/06/21 0600    Assessment: 1. Exam at the time of acute stroke evaluation revealed findings best localizable to the MCA territory of the left cerebral hemisphere. Findings were not consistent  with LVO.  2. Exam today shows resolution of previously seen right facial droop, as well as normal speech, also an improvement from yesterday. However, there is still some residual RUE weakness. RLE weakness is unchanged from yesterday, however.  3. MRI brain: No evidence of acute intracranial abnormality. Stable noncontrast MRI appearance of the brain as compared to 02/18/2021. Mild chronic small-vessel ischemic changes within the cerebral white matter. Mild generalized cerebral atrophy. 4. MRA head: No large vessel occlusion or proximal hemodynamically significant stenosis. 5. Other Neurological conditions: - History of cognitive impairment, suspected to be secondary to possible [name]. - History of seizure in the past. Not on an anticonvulsant. No seizure activity with today's presentation.  - History of hypoglycemic episodes with AMS. Husband states that she has never had focal weakness with one of her hypoglycemic episodes.  6. DDx for her continued right sided weakness despite negative MRI includes conversion disorder (unusual that right facial droop and speech would recover, with continued RUE and RLE weakness), and MRI-negative left internal capsule or thalamic stroke.    Recommendations: 1. Post-TNK order set to include frequent neuro checks and BP management.  2. No antiplatelet medications or anticoagulants for at least 24 hours following TNK. TNK was administered at 2:30 PM yesterday.  3. DVT prophylaxis with SCDs.  4. Will need to be started on a statin. Obtain baseline CK level.  5. Will need to be started on antiplatelet therapy if follow up CT at 24 hours is negative for hemorrhagic conversion. 6. Telemetry monitoring 7. TTE report is pending 8. Carotid ultrasound report pending.  9. Continue rivastigmine  10. PT/OT/Speech. 11. Ultram 25 mg po q6h PRN pain has been ordered.  12. Repeat MRI brain on Friday if her  right sided weakness has not recovered to baseline.     LOS: 1 day   @Electronically  signed: Dr. Kerney Elbe 09/06/2021  9:22 AM

## 2021-09-06 NOTE — Progress Notes (Signed)
*  PRELIMINARY RESULTS* Echocardiogram 2D Echocardiogram has been performed.  Julie Acevedo [name] 09/06/2021, 12:15 PM

## 2021-09-06 NOTE — Evaluation (Signed)
Clinical/Bedside Swallow Evaluation Patient Details  Name: Julie Acevedo MRN: [id number] Date of Birth: [birth year]  Today's Date: 09/06/2021 Time: SLP Start Time (ACUTE ONLY): 1330 SLP Stop Time (ACUTE ONLY): 1430 SLP Time Calculation (min) (ACUTE ONLY): 60 min  Past Medical History:  Past Medical History:  Diagnosis Date   Anemia    Arthritis    COVID-19 11/19/2019   Diverticulitis    DM (diabetes mellitus) ([place])    typ e 2   History of methicillin resistant staphylococcus aureus (MRSA) 2017   HTN (hypertension)    Hyperlipidemia    Memory difficulties 09/01/2015   Multiple thyroid nodules    Myocardial infarction ([place]) 1995   Short-term memory loss    Thyroid nodule    Past Surgical History:  Past Surgical History:  Procedure Laterality Date   ABDOMINAL HYSTERECTOMY     due to endometriosis-1 ovary left   BREAST EXCISIONAL BIOPSY Right 1971   neg   CARDIAC CATHETERIZATION  2000   no significant CAD per note of Dr Caryl Comes in 2013    [place] 03/19/2017   Procedure: ANTERIOR REPAIR (CYSTOCELE);  Surgeon: Bjorn Loser, MD;  Location: [facility];  Service: Urology;  Laterality: N/A;   CYSTOSCOPY N/A 03/19/2017   Procedure: CYSTOSCOPY;  Surgeon: Bjorn Loser, MD;  Location: [facility];  Service: Urology;  Laterality: N/A;   EYE SURGERY     FOOT SURGERY     HAND SURGERY     HEMICOLECTOMY     INSERTION OF MESH N/A 08/21/2016   Procedure: INSERTION OF MESH;  Surgeon: Excell Seltzer, MD;  Location: [facility];  Service: General;  Laterality: N/A;   INSERTION OF MESH N/A 12/08/2019   Procedure: INSERTION OF MESH;  Surgeon: Jules Husbands, MD;  Location: [facility];  Service: General;  Laterality: N/A;   LAPAROSCOPIC LYSIS OF ADHESIONS N/A 08/21/2016   Procedure: LAPAROSCOPIC LYSIS OF ADHESIONS;  Surgeon: Excell Seltzer, MD;  Location: [facility];  Service: General;  Laterality: N/A;   LAPAROSCOPIC REMOVAL ABDOMINAL MASS     LAPAROTOMY N/A 04/16/2019    Procedure: EXPLORATORY LAPAROTOMY;  Surgeon: Benjamine Sprague, DO;  Location: [facility];  Service: General;  Laterality: N/A;   LOOP RECORDER INSERTION N/A 03/21/2018   Procedure: LOOP RECORDER INSERTION;  Surgeon: Sanda Klein, MD;  Location: [place] [facility];  Service: Cardiovascular;  Laterality: N/A;   LUMBAR DISC SURGERY     ROUX-EN-Y GASTRIC BYPASS  2010   Dr Hassell Done   SMALL INTESTINE SURGERY     TONSILLECTOMY     UPPER GI ENDOSCOPY  01/12/2016   normal larynx, normal esophagus. gastric bypass with a normal-sized pouch and intact staple line, normal examined jejunum, otherwise exam was normal   VENTRAL HERNIA REPAIR N/A 08/21/2016   Procedure: LAPAROSCOPIC VENTRAL HERNIA;  Surgeon: Excell Seltzer, MD;  [facility]: [facility];  Service: General;  Laterality: N/A;   XI ROBOTIC ASSISTED VENTRAL HERNIA N/A 12/08/2019   Procedure: XI ROBOTIC ASSISTED VENTRAL HERNIA;  Surgeon: Jules Husbands, MD;  Location: [facility];  Service: General;  Laterality: N/A;   HPI:  Pt is a 70 y/o lady with history of hypertension, constipation, and DM II. History of roux-en-y gastric bypass ~20 years ago.  The patient was here for EGD for Esophagitis(06/2021).  She reportedly became less responsive and experienced acute onset of right-sided weakness and facial droop.  She had her blood sugar checked and it was 44.  She was given D50,  but had persistent altered mental status, slightly delayed speech, right-sided neglect.  She was ultimately activated as a code stroke.  Pt was seen by Neurology; given TNK.  MRI brain: No evidence of acute intracranial abnormality. Stable noncontrast MRI appearance of the brain as compared to 02/18/2021. Mild, chronic small-vessel ischemic changes within the cerebral white matter. Generalized cerebral atrophy. Other Neurological conditions: h/o Cognitive impairment, suspected to be secondary to possible [name]; seizure in the past. Not on an anticonvulsant. No seizure activity  with today's presentation; h/o hypoglycemic episodes with AMS.    Assessment / Plan / Recommendation  Clinical Impression  Pt appears to present w/ adequate oropharyngeal phase swallow w/ No oropharyngeal phase dysphagia noted, No neuromuscular deficits noted. Pt consumed po trials w/ No overt, clinical s/s of aspiration during po trials. Pt appears at reduced risk for aspiration following general aspiration precautions. HOWEVER, pt does have GI dysmotility history including gastric bypass and recent Esophagitis("I felt the burning in my mouth and on my lips"). Pt stated she was given "stomach medication" then but is not currently on an oral PPI now. ANY Esophageal dsymotility can increase risk for Retrograde REFLUX activity thus increase risk for aspiration of REFLUX material.    During po trials, pt consumed all consistencies w/ no overt coughing, decline in vocal quality, or change in respiratory presentation during/post trials. O2 sats 99%. Oral phase appeared [facility] w/ timely bolus management, mastication, and control of bolus propulsion for A-P transfer for swallowing. Oral clearing achieved w/ all trial consistencies. OM Exam appeared [facility] w/ no unilateral weakness noted. Speech Clear. Pt fed self w/ setup support.   Recommend a Regular/mech soft consistency diet w/ well-Cut meats, moistened foods for esophageal ease; Thin liquids. Recommend general aspiration precautions, Pills WHOLE in Puree for safer, easier swallowing as pt described Larger pills causing difficulty to swallow sometimes. Pt stated she used applesauce before. REFLUX precautions. Education given on Pills in Puree; food consistencies and easy to eat options; general aspiration precautions. NSG to reconsult if any new needs arise. NSG agreed.   OF NOTE: During this BSE, pt's speech and cognitive-linguistic skills appeared [facility] w/ all general tasks and conversation. Pt and Daughter present denied any current speech-language deficits. Pt  appears to be at her Baseline w/ no further needs. SLP Visit Diagnosis: Dysphagia, unspecified (R13.10)    Aspiration Risk   (reduced)    Diet Recommendation   Regular/mech soft consistency diet w/ well-Cut meats, moistened foods for esophageal ease; Thin liquids. Recommend general aspiration precautions, REFLUX precautions.  Medication Administration: Whole meds with puree    Other  Recommendations Recommended Consults: Consider GI evaluation;Consider esophageal assessment (Dietician f/u) Oral Care Recommendations: Oral care BID;Patient independent with oral care Other Recommendations:  (n/a)    Recommendations for follow up therapy are one component of a multi-disciplinary discharge planning process, led by the attending physician.  Recommendations may be updated based on patient status, additional functional criteria and insurance authorization.  Follow up Recommendations None      Frequency and Duration  (n/a)   (n/a)       Prognosis Prognosis for Safe Diet Advancement: Good      Swallow Study   General Date of Onset: 09/05/21 HPI: Pt is a 70 y/o lady with history of hypertension, constipation, and DM II. History of roux-en-y gastric bypass ~20 years ago.  The patient was here for EGD for Esophagitis(06/2021).  She reportedly became less responsive and experienced acute onset of right-sided weakness  and facial droop.  She had her blood sugar checked and it was 44.  She was given D50, but had persistent altered mental status, slightly delayed speech, right-sided neglect.  She was ultimately activated as a code stroke.  Pt was seen by Neurology; given TNK.  MRI brain: No evidence of acute intracranial abnormality. Stable noncontrast MRI appearance of the brain as compared to 02/18/2021. Mild, chronic small-vessel ischemic changes within the cerebral white matter. Generalized cerebral atrophy. Other Neurological conditions: h/o Cognitive impairment, suspected to be secondary to  possible [name]; seizure in the past. Not on an anticonvulsant. No seizure activity with today's presentation; h/o hypoglycemic episodes with AMS. Type of Study: Bedside Swallow Evaluation Previous Swallow Assessment: none Diet Prior to this Study: NPO (regular at home but ate mostly soups) Temperature Spikes Noted: No (wbc 5.7) Respiratory Status: Nasal cannula (2L) History of Recent Intubation: No Behavior/Cognition: Alert;Cooperative;Pleasant mood (speech clear) Oral Cavity Assessment: Within Functional Limits Oral Care Completed by SLP: Yes Oral Cavity - Dentition: Adequate natural dentition Vision: Functional for self-feeding Self-Feeding Abilities: Able to feed self (no UE weakness for self-feeding noted) Patient Positioning: Upright in bed (pt positioned herself upright in bed) Baseline Vocal Quality: Normal;Low vocal intensity (baseline per pt) Volitional Cough: Strong Volitional Swallow: Able to elicit    Oral/Motor/Sensory Function Overall Oral Motor/Sensory Function: Within functional limits   Ice Chips Ice chips: Within functional limits Presentation: Spoon (fed; 2 trials)   Thin Liquid Thin Liquid: Within functional limits Presentation: Self Fed;Straw (10 trials)    Nectar Thick Nectar Thick Liquid: Not tested   Honey Thick Honey Thick Liquid: Not tested   Puree Puree: Within functional limits Presentation: Spoon;Self Fed (3 trials)   Solid     Solid: Within functional limits Presentation: Self Fed (4 trials)         Orinda Kenner, MS, [place] Speech Language Pathologist Rehab Services [phone] [facility] 09/06/2021,5:09 PM

## 2021-09-06 NOTE — Progress Notes (Signed)
OT Cancellation Note  Patient Details Name: Julie Acevedo MRN: [id number] DOB: Nov 21, 1951   Cancelled Treatment:    Reason Eval/Treat Not Completed: Patient not medically ready. Consult received and chart reviewed.  Patient admitted to ICU for CVA work up with TNK infusion (ending at 1451, 9/21).  Per guidelines, to be on strict bedrest x24 hours post infusion and follow up imaging required to be completed prior to initiation of therapy.  Will continue to follow and initiate as medically appropriate.   Fredirick Maudlin, OTR/L [place]

## 2021-09-06 NOTE — Progress Notes (Signed)
NAME:  Julie Acevedo, MRN:  [id number], DOB:  [birth year], LOS: 1 ADMISSION DATE:  09/05/2021, CONSULTATION DATE:  09/05/21 REFERRING MD:  Duffy Bruce, MD CHIEF COMPLAINT:  Code Stroke   History of Present Illness:  Julie Acevedo is a 70 year old woman with history of hypertension, DMII, and memory issues concerning for lewey body dementia who presented today for EGD who developed altered mental status and loss of consciousness prior to her procedure. Rapid response was called and she was noted to have a blood sugar of 44 and was given D50 and IV fluid bolus. Her blood sugar improved to 105 but her mental status did not improve. Code stroke was then called and stat CT head obtained which did not show acute hemorrhage.  Neurology evaluated and decided to give TPA therapy. PCCM has been called to admit the patient for close neurologic monitoring.   Patient's husband is at the bedside. He reported she was in normal health without any issues prior to today.  He said that she had a stroke about 20 to 23 years ago.  Otherwise she has been driving, going to church and running errands independently.  Pertinent  Medical History    has a past medical history of Anemia, Arthritis, COVID-19 (11/19/2019), Diverticulitis, DM (diabetes mellitus) ([place]), History of methicillin resistant staphylococcus aureus (MRSA) (2017), HTN (hypertension), Hyperlipidemia, Memory difficulties (09/01/2015), Multiple thyroid nodules, Myocardial infarction ([place]) (1995), Short-term memory loss, and Thyroid nodule.   Significant Hospital Events: Including procedures, antibiotic start and stop dates in addition to other pertinent events   9/20 Admitted to ICU statpus TPA for code stroke   Interim History / Subjective:   Stable overnight, started dextrose for low blood sugar  Objective   Blood pressure 115/64, pulse 61, temperature 98.2 F (36.8 C), temperature source Oral, resp. rate 13, height _0  (1.626 m), weight 52.1 kg,  SpO2 100 %.        Intake/Output Summary (Last 24 hours) at 09/06/2021 9833 Last data filed at 09/06/2021 0600 Gross per 24 hour  Intake 419.59 ml  Output --  Net 419.59 ml   Filed Weights   09/05/21 1422 09/06/21 0100  Weight: 60.7 kg 52.1 kg    Examination: Gen:      No acute distress, elderly woman in no distress HEENT:  EOMI, sclera anicteric Neck:     No masses; no thyromegaly Lungs:    Clear to auscultation bilaterally; normal respiratory effort CV:         Regular rate and rhythm; no murmurs Abd:      + bowel sounds; soft, non-tender; no palpable masses, no distension Ext:    No edema; adequate peripheral perfusion Skin:      Warm and dry; no rash Neuro: alert and oriented x 3, right sided weakness  Labs/imaging personally reviewed Significant for creatinine 1.39, WBC 5.7, hemoglobin 9.1, platelets 237 MRI brain and MRI angio of with no acute process, no large vessel occlusion  Resolved Hospital Problem list     Assessment & Plan:  Acute Mental Status Change Acute Stroke - Post TPA protocol ordered Continue neurochecks Neurology on board, Follow echocardiogram, cardiac ultrasound Speech eval No antiplatelet medications or anticoagulants for at least 24 hours following TNK. Will need to be started on antiplatelet therapy if follow up CT at 24 hours is negative for hemorrhagic conversion  Diabetes Melltius Type II - hold home metformin - SSI insulin when D5 not needed  Hypertension - PRN hydralazine IV -  Hold home HCTZ  Hypoglycemia Continue dextrose  Anemia, Chronic - Monitor  Elevated Alk Phos - monitor - no acute abdominal issues  Hypokalemia - replete IV  Memory Loss/Dementia - continue rivastigmine  Esophagitis, gastric bypass EGD on hold for now Continue PPI  Stable for transfer to hospitalist service.  PCCM will be off 9/22  Best Practice (right click and "Reselect all SmartList Selections" daily)   Diet/type: NPO, swallow  eval DVT prophylaxis: SCD GI prophylaxis: PPI Lines: N/A Foley:  N/A Code Status:  full code Last date of multidisciplinary goals of care discussion [n/a]  Critical care time: NA   Marshell Garfinkel MD [place] [facility] See Amion for pager  If no response to pager , please call [phone] until 7pm After 7:00 pm call Elink  [phone] 09/06/2021, 7:28 AM

## 2021-09-06 NOTE — Progress Notes (Signed)
Removed right upper PIV d/t pain, swelling and warm... Pt started c/o pain right after D50 given for hypoglycemic event around 0035  Notified eLink  Gave pt acetaminophen w/no relief.... as soon as the PIV came out, pt did state she felt some relief.  Will wait for further instructions.

## 2021-09-07 DIAGNOSIS — E119 Type 2 diabetes mellitus without complications: Secondary | ICD-10-CM

## 2021-09-07 DIAGNOSIS — I10 Essential (primary) hypertension: Secondary | ICD-10-CM

## 2021-09-07 LAB — BASIC METABOLIC PANEL
Anion gap: 7 (ref 5–15)
BUN: 11 mg/dL (ref 8–23)
CO2: 26 mmol/L (ref 22–32)
Calcium: 9 mg/dL (ref 8.9–10.3)
Chloride: 106 mmol/L (ref 98–111)
Creatinine, Ser: 1.15 mg/dL — ABNORMAL HIGH (ref 0.44–1.00)
GFR, Estimated: 52 mL/min — ABNORMAL LOW (ref >60)
Glucose, Bld: 112 mg/dL — ABNORMAL HIGH (ref 70–99)
Potassium: 3.9 mmol/L (ref 3.5–5.1)
Sodium: 139 mmol/L (ref 135–145)

## 2021-09-07 LAB — CBC
HCT: 37 % (ref 36.0–46.0)
Hemoglobin: 11.9 g/dL — ABNORMAL LOW (ref 12.0–15.0)
MCH: 30.1 pg (ref 26.0–34.0)
MCHC: 32.2 g/dL (ref 30.0–36.0)
MCV: 93.7 fL (ref 80.0–100.0)
Platelets: 222 10*3/uL (ref 150–400)
RBC: 3.95 MIL/uL (ref 3.87–5.11)
RDW: 14 % (ref 11.5–15.5)
WBC: 6.6 10*3/uL (ref 4.0–10.5)
nRBC: 0 % (ref 0.0–0.2)

## 2021-09-07 LAB — HEMOGLOBIN A1C
Hgb A1c MFr Bld: 5.6 % (ref 4.8–5.6)
Mean Plasma Glucose: 114 mg/dL

## 2021-09-07 LAB — GLUCOSE, CAPILLARY
Glucose-Capillary: 83 mg/dL (ref 70–99)
Glucose-Capillary: 71 mg/dL (ref 70–99)
Glucose-Capillary: 83 mg/dL (ref 70–99)
Glucose-Capillary: 81 mg/dL (ref 70–99)
Glucose-Capillary: 76 mg/dL (ref 70–99)
Glucose-Capillary: 81 mg/dL (ref 70–99)

## 2021-09-07 LAB — PHOSPHORUS: Phosphorus: 3 mg/dL (ref 2.5–4.6)

## 2021-09-07 LAB — CK: Total CK: 90 U/L (ref 38–234)

## 2021-09-07 LAB — MAGNESIUM: Magnesium: 1.8 mg/dL (ref 1.7–2.4)

## 2021-09-07 MED ORDER — ASPIRIN EC 81 MG PO TBEC
81.0000 mg | DELAYED_RELEASE_TABLET | Freq: Every day | ORAL | Status: DC
  Administered 2021-09-07 – 2021-09-08 (×2): 81 mg via ORAL
  Filled 2021-09-07 (×2): qty 1

## 2021-09-07 MED ORDER — TIZANIDINE HCL 2 MG PO TABS
2.0000 mg | ORAL_TABLET | Freq: Three times a day (TID) | ORAL | Status: DC | PRN
  Administered 2021-09-07: 2 mg via ORAL
  Filled 2021-09-07 (×3): qty 1

## 2021-09-07 MED ORDER — ATORVASTATIN CALCIUM 20 MG PO TABS
40.0000 mg | ORAL_TABLET | Freq: Every day | ORAL | Status: DC
  Administered 2021-09-07 – 2021-09-08 (×2): 40 mg via ORAL
  Filled 2021-09-07 (×2): qty 2

## 2021-09-07 NOTE — Evaluation (Signed)
Occupational Therapy Evaluation Patient Details Name: Julie Acevedo MRN: [id number] DOB: 1951-11-29 Today's Date: 09/07/2021   History of Present Illness 70 y.o. female was at hospital for EGD today where she presented with hypoglycemia and stroke-like symptoms resulting in R-sided facial droop and weakness. History of HTN, constipation, DM II, syncope, dementia (possible [name]), lumbosacral radiculopathy, prior roux-en-y gastric bypass, anxiety and depression.   Clinical Impression   Pt seen for OT evaluation this date. Prior to hospital admission, pt was independent in all aspects of ADL and IADL including gardening and driving.   Currently pt demonstrates significant impairments in her non-dominant RUE/RLE strength and coordination resulting in impaired balance as well. Pt is L hand dominant and currently requires supervision-CGA for ADL transfers without AD, set up and supervision for seated bathing and dressing. Pt is eager to return to his PLOF with increased independence and functional use of her R arm and leg. Pt/family educated on [facility] activities and incorporating RUE into functional tasks to maximize return to PLOF. Pt would benefit from skilled OT to address noted impairments and functional limitations (see below for any additional details) in order to maximize safety and independence while minimizing falls risk and caregiver burden.  Upon hospital discharge, recommend pt discharge to [facility] to maximize safety and return to PLOF.      Recommendations for follow up therapy are one component of a multi-disciplinary discharge planning process, led by the attending physician.  Recommendations may be updated based on patient status, additional functional criteria and insurance authorization.   Follow Up Recommendations  Home health OT    Equipment Recommendations  None recommended by OT    Recommendations for Other Services       Precautions / Restrictions  Precautions Precautions: Fall Restrictions Weight Bearing Restrictions: No      Mobility Bed Mobility Overal bed mobility: Modified Independent             General bed mobility comments: increased time    Transfers Overall transfer level: Needs assistance Equipment used: None Transfers: Sit to/from Stand Sit to Stand: Supervision Stand pivot transfers: Supervision       General transfer comment: SUP for safety. SUE support on arm of recliner during stand-pivot.    Balance Overall balance assessment: Needs assistance Sitting-balance support: No upper extremity supported;Feet supported Sitting balance-Leahy Scale: Good     Standing balance support: No upper extremity supported Standing balance-Leahy Scale: Fair Standing balance comment: No LOB however mild unsteadiness on feet, especially with initial mobilization.                           ADL either performed or assessed with clinical judgement   ADL                                         General ADL Comments: Pt currently requires supervision for safety for UB/LB bathing/dressing from seated position, S-CGA for ADL transfers     Vision Patient Visual Report: No change from baseline Vision Assessment?: No apparent visual deficits     Perception     Praxis      Pertinent Vitals/Pain Pain Assessment: No/denies pain     Hand Dominance Left   Extremity/Trunk Assessment Upper Extremity Assessment Upper Extremity Assessment: Generalized weakness;RUE deficits/detail RUE Deficits / Details: grossly 4-/5 to 4/5, impaired [facility] RUE Sensation:  WNL RUE Coordination: decreased fine motor   Lower Extremity Assessment Lower Extremity Assessment: Generalized weakness;RLE deficits/detail RLE Deficits / Details: general weakness in BLE, R>L. Pain with bilateral testing due to baseline chronic LBP RLE Coordination: decreased fine motor;decreased gross motor       Communication  Communication Communication: No difficulties   Cognition Arousal/Alertness: Awake/alert Behavior During Therapy: WFL for tasks assessed/performed Overall Cognitive Status: Within Functional Limits for tasks assessed                                 General Comments: A&Ox4   General Comments       Exercises Other Exercises Other Exercises: Pt/family instructed in [facility] activities and encouraged RUE use during ADL functional tasks to maximize return of RUE strength and coordination. Pt provided with adult coloring book and crayons.   Shoulder Instructions      Home Living Family/patient expects to be discharged to:: Private residence Living Arrangements: Spouse/significant other Available Help at Discharge: Family (husband and 2 daughters who live close by) Type of Home: House Home Access: Stairs to enter [employer] of Steps: 5 Entrance Stairs-Rails: Left Home Layout: Two level;Able to live on main level with bedroom/bathroom Alternate Level Stairs-Number of Steps: Pt does not go upstairs anymore   Bathroom Shower/Tub: Walk-in shower;Tub/shower unit   Bathroom Toilet: Standard Bathroom Accessibility: Yes   Home Equipment: Clinical cytogeneticist - 2 wheels;Wheelchair - manual;Cane - single point;Bedside commode (bathroom is currently being remodled to be fully accessible)          Prior Functioning/Environment Level of Independence: Independent        Comments: Independent with all ADLs and IADLs including driving and community ambulation.        OT Problem List: Decreased strength;Decreased coordination;Impaired balance (sitting and/or standing);Impaired UE functional use      OT Treatment/Interventions: Self-care/ADL training;Therapeutic exercise;Therapeutic activities;Neuromuscular education;DME and/or AE instruction;Patient/family education;Balance training    OT Goals(Current goals can be found in the care plan section) Acute Rehab OT  Goals Patient Stated Goal: to go home and get back to gardening OT Goal Formulation: With patient/family Time For Goal Achievement: 09/21/21 Potential to Achieve Goals: Good ADL Goals Pt Will Transfer to Toilet: ambulating;with modified independence (AD PRN) Additional ADL Goal #1: Pt will perform morning ADL routine with modified independence. Additional ADL Goal #2: Pt will perform learned [facility] activities/ex to improve [place], strength, and functional  RUE use with handout.  OT Frequency: Min 2X/week   Barriers to D/C:            Co-evaluation              AM-PAC OT "6 Clicks" Daily Activity     Outcome Measure Help from another person eating meals?: None Help from another person taking care of personal grooming?: None Help from another person toileting, which includes using toliet, bedpan, or urinal?: A Little Help from another person bathing (including washing, rinsing, drying)?: A Little Help from another person to put on and taking off regular upper body clothing?: None Help from another person to put on and taking off regular lower body clothing?: A Little 6 Click Score: 21   End of Session    Activity Tolerance: Patient tolerated treatment well Patient left: in bed;with call bell/phone within reach;with bed alarm set;with family/visitor present  OT Visit Diagnosis: Other abnormalities of gait and mobility (R26.89);Hemiplegia and hemiparesis Hemiplegia - Right/Left: Right Hemiplegia -  dominant/non-dominant: Non-Dominant Hemiplegia - caused by: Cerebral infarction                Time: 4315-4008 OT Time Calculation (min): 18 min Charges:  OT General Charges $OT Visit: 1 Visit OT Evaluation $OT Eval Low Complexity: 1 Low OT Treatments $Therapeutic Activity: 8-22 mins  Ardeth Perfect., MPH, MS, OTR/L [place] [phone] 09/07/21, 3:00 PM

## 2021-09-07 NOTE — Progress Notes (Addendum)
Subjective: Feels like her strength is better today. Wants to go home.  Objective: Current vital signs: BP 129/71 (BP Location: Right Arm)   Pulse 61   Temp 98.7 F (37.1 C) (Oral)   Resp 15   Ht 5\' 4"  (1.626 m)   Wt 52.1 kg   SpO2 100%   BMI 19.72 kg/m  Vital signs in last 24 hours: Temp:  [97.9 F (36.6 C)-98.9 F (37.2 C)] 98.7 F (37.1 C) (09/22 0402) Pulse Rate:  [29-117] 61 (09/22 0402) Resp:  [13-37] 15 (09/22 0402) BP: (95-130)/(57-92) 129/71 (09/22 0402) SpO2:  [76 %-100 %] 100 % (09/22 0402)  Intake/Output from previous day: 09/21 0701 - 09/22 0700 In: 807.4 [P.O.:360; I.V.:447.4] Out: 1000 [Urine:1000] Intake/Output this shift: No intake/output data recorded. Nutritional status:  Diet Order             Diet regular Room service appropriate? Yes with Assist; Fluid consistency: Thin  Diet effective now                  HEENT: [place]/AT Lungs: Respirations unlabored Ext: Warm and well perfused  Neurologic Exam: Ment: Awake and alert. Speech is back to baseline today per daughter, and objectively significantly improved on exam. Able to answer all questions normally.  CN: Fixates and tracks normally. EOMI. Smile is symmetric. No dysarthria Motor: 5/5 LUE and LLE 4+/5 RUE 4/5 RLE Tone and bulk are normal x 4.  No pronator drift.  Cerebellar: No ataxia noted  Lab Results: Results for orders placed or performed during the hospital encounter of 09/05/21 (from the past 48 hour(s))  CBG monitoring, ED     Status: Abnormal   Collection Time: 09/05/21  2:13 PM  Result Value Ref Range   Glucose-Capillary 188 (H) 70 - 99 mg/dL    Comment: Glucose reference range applies only to samples taken after fasting for at least 8 hours.  Protime-INR     Status: None   Collection Time: 09/05/21  2:14 PM  Result Value Ref Range   Prothrombin Time 14.0 11.4 - 15.2 seconds   INR 1.1 0.8 - 1.2    Comment: (NOTE) INR goal varies based on device and disease  states. Performed at [facility], [address] [place], [place] [address]   APTT     Status: None   Collection Time: 09/05/21  2:14 PM  Result Value Ref Range   aPTT 31 24 - 36 seconds    Comment: Performed at [facility], [address] [place], [place] [address]  CBC     Status: Abnormal   Collection Time: 09/05/21  2:14 PM  Result Value Ref Range   WBC 5.7 4.0 - 10.5 K/uL   RBC 3.69 (L) 3.87 - 5.11 MIL/uL   Hemoglobin 11.1 (L) 12.0 - 15.0 g/dL   HCT 33.7 (L) 36.0 - 46.0 %   MCV 91.3 80.0 - 100.0 fL   MCH 30.1 26.0 - 34.0 pg   MCHC 32.9 30.0 - 36.0 g/dL   RDW 13.9 11.5 - 15.5 %   Platelets 237 150 - 400 K/uL   nRBC 0.0 0.0 - 0.2 %    Comment: Performed at [facility], [address] [place], [place] [address]  Differential     Status: None   Collection Time: 09/05/21  2:14 PM  Result Value Ref Range   Neutrophils Relative % 66 %   Neutro Abs 3.7 1.7 - 7.7 K/uL   Lymphocytes Relative 26 %  Lymphs Abs 1.5 0.7 - 4.0 K/uL   Monocytes Relative 7 %   Monocytes Absolute 0.4 0.1 - 1.0 K/uL   Eosinophils Relative 1 %   Eosinophils Absolute 0.1 0.0 - 0.5 K/uL   Basophils Relative 0 %   Basophils Absolute 0.0 0.0 - 0.1 K/uL   Immature Granulocytes 0 %   Abs Immature Granulocytes 0.02 0.00 - 0.07 K/uL    Comment: Performed at [facility], [address] [place], [place] [address]  Comprehensive metabolic panel     Status: Abnormal   Collection Time: 09/05/21  2:14 PM  Result Value Ref Range   Sodium 137 135 - 145 mmol/L   Potassium 3.3 (L) 3.5 - 5.1 mmol/L   Chloride 100 98 - 111 mmol/L   CO2 28 22 - 32 mmol/L   Glucose, Bld 332 (H) 70 - 99 mg/dL    Comment: Glucose reference range applies only to samples taken after fasting for at least 8 hours.   BUN 17 8 - 23 mg/dL   Creatinine, Ser 1.39 (H) 0.44 - 1.00 mg/dL   Calcium 8.6 (L) 8.9 - 10.3 mg/dL   Total Protein 6.4 (L) 6.5 - 8.1 g/dL   Albumin 3.3 (L) 3.5 - 5.0 g/dL   AST  17 15 - 41 U/L   ALT 7 0 - 44 U/L   Alkaline Phosphatase 152 (H) 38 - 126 U/L   Total Bilirubin 0.6 0.3 - 1.2 mg/dL   GFR, Estimated 41 (L) >60 mL/min    Comment: (NOTE) Calculated using the CKD-EPI Creatinine Equation (2021)    Anion gap 9 5 - 15    Comment: Performed at [facility], [address] [place], [place] [address]  POC CBG, ED     Status: None   Collection Time: 09/05/21  4:41 PM  Result Value Ref Range   Glucose-Capillary 86 70 - 99 mg/dL    Comment: Glucose reference range applies only to samples taken after fasting for at least 8 hours.  Urine Drug Screen, Qualitative     Status: Abnormal   Collection Time: 09/05/21  5:00 PM  Result Value Ref Range   Tricyclic, Ur Screen POSITIVE (A) NONE DETECTED   Amphetamines, Ur Screen NONE DETECTED NONE DETECTED   MDMA (Ecstasy)Ur Screen NONE DETECTED NONE DETECTED   Cocaine Metabolite,Ur [place] NONE DETECTED NONE DETECTED   Opiate, Ur Screen NONE DETECTED NONE DETECTED   Phencyclidine (PCP) Ur S NONE DETECTED NONE DETECTED   Cannabinoid 50 Ng, Ur [place] NONE DETECTED NONE DETECTED   Barbiturates, Ur Screen NONE DETECTED NONE DETECTED   Benzodiazepine, Ur Scrn NONE DETECTED NONE DETECTED   Methadone Scn, Ur NONE DETECTED NONE DETECTED    Comment: (NOTE) Tricyclics + metabolites, urine    Cutoff 1000 ng/mL Amphetamines + metabolites, urine  Cutoff 1000 ng/mL MDMA (Ecstasy), urine              Cutoff 500 ng/mL Cocaine Metabolite, urine          Cutoff 300 ng/mL Opiate + metabolites, urine        Cutoff 300 ng/mL Phencyclidine (PCP), urine         Cutoff 25 ng/mL Cannabinoid, urine                 Cutoff 50 ng/mL Barbiturates + metabolites, urine  Cutoff 200 ng/mL Benzodiazepine, urine              Cutoff 200 ng/mL Methadone, urine  Cutoff 300 ng/mL  The urine drug screen provides only a preliminary, unconfirmed analytical test result and should not be used for non-medical purposes. Clinical  consideration and professional judgment should be applied to any positive drug screen result due to possible interfering substances. A more specific alternate chemical method must be used in order to obtain a confirmed analytical result. Gas chromatography / mass spectrometry (GC/MS) is the preferred confirm atory method. Performed at [facility], [address] [place], [place] [address]   Urinalysis, Complete w Microscopic     Status: Abnormal   Collection Time: 09/05/21  5:00 PM  Result Value Ref Range   Color, Urine YELLOW YELLOW   APPearance CLEAR CLEAR   Specific Gravity, Urine 1.015 1.005 - 1.030   pH 7.0 5.0 - 8.0   Glucose, UA 250 (A) NEGATIVE mg/dL   Hgb urine dipstick TRACE (A) NEGATIVE   Bilirubin Urine NEGATIVE NEGATIVE   Ketones, ur NEGATIVE NEGATIVE mg/dL   Protein, ur NEGATIVE NEGATIVE mg/dL   Nitrite NEGATIVE NEGATIVE   Leukocytes,Ua NEGATIVE NEGATIVE   Squamous Epithelial / LPF 0-5 0 - 5   WBC, UA 0-5 0 - 5 WBC/hpf   RBC / HPF 0-5 0 - 5 RBC/hpf   Bacteria, UA NONE SEEN NONE SEEN   Mucus PRESENT    Hyaline Casts, UA PRESENT     Comment: Performed at [facility], [address], [place], [place] [address]  Resp Panel by RT-PCR (Flu A&B, Covid) Nasopharyngeal Swab     Status: None   Collection Time: 09/05/21  6:50 PM   Specimen: Nasopharyngeal Swab; Nasopharyngeal(NP) swabs in vial transport medium  Result Value Ref Range   SARS Coronavirus 2 by RT PCR NEGATIVE NEGATIVE    Comment: (NOTE) SARS-CoV-2 target nucleic acids are NOT DETECTED.  The SARS-CoV-2 RNA is generally detectable in upper respiratory specimens during the acute phase of infection. The lowest concentration of SARS-CoV-2 viral copies this assay can detect is 138 copies/mL. A negative result does not preclude SARS-Cov-2 infection and should not be used as the sole basis for treatment or other patient management decisions. A negative result may occur with  improper  specimen collection/handling, submission of specimen other than nasopharyngeal swab, presence of viral mutation(s) within the areas targeted by this assay, and inadequate number of viral copies(<138 copies/mL). A negative result must be combined with clinical observations, patient history, and epidemiological information. The expected result is Negative.  Fact Sheet for Patients:  EntrepreneurPulse.com.au  Fact Sheet for Healthcare Providers:  IncredibleEmployment.be  This test is no t yet approved or cleared by the [place] FDA and  has been authorized for detection and/or diagnosis of SARS-CoV-2 by FDA under an Emergency Use Authorization (EUA). This EUA will remain  in effect (meaning this test can be used) for the duration of the COVID-19 declaration under Section 564(b)(1) of the Act, 21 U.S.C.section 360bbb-3(b)(1), unless the authorization is terminated  or revoked sooner.       Influenza A by PCR NEGATIVE NEGATIVE   Influenza B by PCR NEGATIVE NEGATIVE    Comment: (NOTE) The Xpert Xpress SARS-CoV-2/FLU/RSV plus assay is intended as an aid in the diagnosis of influenza from Nasopharyngeal swab specimens and should not be used as a sole basis for treatment. Nasal washings and aspirates are unacceptable for Xpert Xpress SARS-CoV-2/FLU/RSV testing.  Fact Sheet for Patients: EntrepreneurPulse.com.au  Fact Sheet for Healthcare Providers: IncredibleEmployment.be  This test is not yet approved or cleared by the [place]  FDA and has been authorized for detection and/or diagnosis of SARS-CoV-2 by FDA under an Emergency Use Authorization (EUA). This EUA will remain in effect (meaning this test can be used) for the duration of the COVID-19 declaration under Section 564(b)(1) of the Act, 21 U.S.C. section 360bbb-3(b)(1), unless the authorization is terminated or revoked.  Performed at [facility], [address] [place], [place] [address]   POC CBG, ED     Status: Abnormal   Collection Time: 09/05/21  6:57 PM  Result Value Ref Range   Glucose-Capillary 40 (LL) 70 - 99 mg/dL    Comment: Glucose reference range applies only to samples taken after fasting for at least 8 hours.   Comment 1 Notify RN   Ethanol     Status: None   Collection Time: 09/05/21  7:14 PM  Result Value Ref Range   Alcohol, Ethyl (B) <10 <10 mg/dL    Comment: (NOTE) Lowest detectable limit for serum alcohol is 10 mg/dL.  For medical purposes only. Performed at [facility], [address] [place], [place] [address]   CK     Status: None   Collection Time: 09/05/21  7:14 PM  Result Value Ref Range   Total CK 58 38 - 234 U/L    Comment: Performed at [facility], [address] [place], [place] [address]  POC CBG, ED     Status: Abnormal   Collection Time: 09/05/21  7:21 PM  Result Value Ref Range   Glucose-Capillary 130 (H) 70 - 99 mg/dL    Comment: Glucose reference range applies only to samples taken after fasting for at least 8 hours.  Glucose, capillary     Status: Abnormal   Collection Time: 09/06/21 12:35 AM  Result Value Ref Range   Glucose-Capillary 51 (L) 70 - 99 mg/dL    Comment: Glucose reference range applies only to samples taken after fasting for at least 8 hours.   Comment 1 Notify RN    Comment 2 Document in Chart   MRSA Next Gen by PCR, Nasal     Status: None   Collection Time: 09/06/21 12:45 AM   Specimen: Nasal Mucosa; Nasal Swab  Result Value Ref Range   MRSA by PCR Next Gen NOT DETECTED NOT DETECTED    Comment: (NOTE) The GeneXpert MRSA Assay (FDA approved for NASAL specimens only), is one component of a comprehensive MRSA colonization surveillance program. It is not intended to diagnose MRSA infection nor to guide or monitor treatment for MRSA infections. Test performance is not FDA approved in patients less than 85  years old. Performed at [facility], [address] [place], [place] [address]   Glucose, capillary     Status: Abnormal   Collection Time: 09/06/21  1:43 AM  Result Value Ref Range   Glucose-Capillary 61 (L) 70 - 99 mg/dL    Comment: Glucose reference range applies only to samples taken after fasting for at least 8 hours.  Glucose, capillary     Status: Abnormal   Collection Time: 09/06/21  3:19 AM  Result Value Ref Range   Glucose-Capillary 106 (H) 70 - 99 mg/dL    Comment: Glucose reference range applies only to samples taken after fasting for at least 8 hours.  Hemoglobin A1c     Status: None   Collection Time: 09/06/21  4:43 AM  Result Value Ref Range   Hgb A1c MFr Bld 5.6 4.8 - 5.6 %    Comment: (NOTE)  Prediabetes: 5.7 - 6.4         Diabetes: >6.4         Glycemic control for adults with diabetes: <7.0    Mean Plasma Glucose 114 mg/dL    Comment: (NOTE) Performed At: [facility] [place], [place] [id number] Rush Farmer MD [id number]   Lipid panel     Status: None   Collection Time: 09/06/21  4:43 AM  Result Value Ref Range   Cholesterol 134 0 - 200 mg/dL   Triglycerides 54 <150 mg/dL   HDL 60 >40 mg/dL   Total CHOL/HDL Ratio 2.2 RATIO   VLDL 11 0 - 40 mg/dL   LDL Cholesterol 63 0 - 99 mg/dL    Comment:        Total Cholesterol/HDL:CHD Risk Coronary Heart Disease Risk Table                     Men   Women  1/2 Average Risk   3.4   3.3  Average Risk       5.0   4.4  2 X Average Risk   9.6   7.1  3 X Average Risk  23.4   11.0        Use the calculated Patient Ratio above and the CHD Risk Table to determine the patient's CHD Risk.        ATP III CLASSIFICATION (LDL):  <100     mg/dL   Optimal  100-129  mg/dL   Near or Above                    Optimal  130-159  mg/dL   Borderline  160-189  mg/dL   High  >190     mg/dL   Very High Performed at [facility], [address] [place], [place]  [address]   Glucose, capillary     Status: None   Collection Time: 09/06/21  9:31 AM  Result Value Ref Range   Glucose-Capillary 76 70 - 99 mg/dL    Comment: Glucose reference range applies only to samples taken after fasting for at least 8 hours.  Glucose, capillary     Status: Abnormal   Collection Time: 09/06/21 12:09 PM  Result Value Ref Range   Glucose-Capillary 33 (LL) 70 - 99 mg/dL    Comment: Glucose reference range applies only to samples taken after fasting for at least 8 hours.   Comment 1 Notify RN    Comment 2 Repeat Test   Glucose, capillary     Status: Abnormal   Collection Time: 09/06/21 12:12 PM  Result Value Ref Range   Glucose-Capillary 51 (L) 70 - 99 mg/dL    Comment: Glucose reference range applies only to samples taken after fasting for at least 8 hours.  Glucose, capillary     Status: Abnormal   Collection Time: 09/06/21  1:40 PM  Result Value Ref Range   Glucose-Capillary 120 (H) 70 - 99 mg/dL    Comment: Glucose reference range applies only to samples taken after fasting for at least 8 hours.  Glucose, capillary     Status: None   Collection Time: 09/06/21  8:51 PM  Result Value Ref Range   Glucose-Capillary 80 70 - 99 mg/dL    Comment: Glucose reference range applies only to samples taken after fasting for at least 8 hours.  Glucose, capillary     Status: None   Collection Time: 09/07/21  1:06  AM  Result Value Ref Range   Glucose-Capillary 83 70 - 99 mg/dL    Comment: Glucose reference range applies only to samples taken after fasting for at least 8 hours.  Glucose, capillary     Status: None   Collection Time: 09/07/21  4:08 AM  Result Value Ref Range   Glucose-Capillary 71 70 - 99 mg/dL    Comment: Glucose reference range applies only to samples taken after fasting for at least 8 hours.  CBC     Status: Abnormal   Collection Time: 09/07/21  5:32 AM  Result Value Ref Range   WBC 6.6 4.0 - 10.5 K/uL   RBC 3.95 3.87 - 5.11 MIL/uL   Hemoglobin 11.9 (L)  12.0 - 15.0 g/dL   HCT 37.0 36.0 - 46.0 %   MCV 93.7 80.0 - 100.0 fL   MCH 30.1 26.0 - 34.0 pg   MCHC 32.2 30.0 - 36.0 g/dL   RDW 14.0 11.5 - 15.5 %   Platelets 222 150 - 400 K/uL   nRBC 0.0 0.0 - 0.2 %    Comment: Performed at [facility], [address] [place], [place] [address]  Basic metabolic panel     Status: Abnormal   Collection Time: 09/07/21  5:32 AM  Result Value Ref Range   Sodium 139 135 - 145 mmol/L   Potassium 3.9 3.5 - 5.1 mmol/L   Chloride 106 98 - 111 mmol/L   CO2 26 22 - 32 mmol/L   Glucose, Bld 112 (H) 70 - 99 mg/dL    Comment: Glucose reference range applies only to samples taken after fasting for at least 8 hours.   BUN 11 8 - 23 mg/dL   Creatinine, Ser 1.15 (H) 0.44 - 1.00 mg/dL   Calcium 9.0 8.9 - 10.3 mg/dL   GFR, Estimated 52 (L) >60 mL/min    Comment: (NOTE) Calculated using the CKD-EPI Creatinine Equation (2021)    Anion gap 7 5 - 15    Comment: Performed at [facility], [address], [place], [place] [address]  Magnesium     Status: None   Collection Time: 09/07/21  5:32 AM  Result Value Ref Range   Magnesium 1.8 1.7 - 2.4 mg/dL    Comment: Performed at [facility], [address], [place], [place] [address]  Phosphorus     Status: None   Collection Time: 09/07/21  5:32 AM  Result Value Ref Range   Phosphorus 3.0 2.5 - 4.6 mg/dL    Comment: Performed at [facility], [address] [place],  [address]  CK     Status: None   Collection Time: 09/07/21  5:32 AM  Result Value Ref Range   Total CK 90 38 - 234 U/L    Comment: Performed at [facility], [address] [place],  [address]    Recent Results (from the past 240 hour(s))  Resp Panel by RT-PCR (Flu A&B, Covid) Nasopharyngeal Swab     Status: None   Collection Time: 09/05/21  6:50 PM   Specimen: Nasopharyngeal Swab; Nasopharyngeal(NP) swabs in vial transport medium  Result Value Ref Range Status   SARS Coronavirus  2 by RT PCR NEGATIVE NEGATIVE Final    Comment: (NOTE) SARS-CoV-2 target nucleic acids are NOT DETECTED.  The SARS-CoV-2 RNA is generally detectable in upper respiratory specimens during the acute phase of infection. The lowest concentration of SARS-CoV-2 viral copies this assay can detect is 138 copies/mL. A negative  result does not preclude SARS-Cov-2 infection and should not be used as the sole basis for treatment or other patient management decisions. A negative result may occur with  improper specimen collection/handling, submission of specimen other than nasopharyngeal swab, presence of viral mutation(s) within the areas targeted by this assay, and inadequate number of viral copies(<138 copies/mL). A negative result must be combined with clinical observations, patient history, and epidemiological information. The expected result is Negative.  Fact Sheet for Patients:  EntrepreneurPulse.com.au  Fact Sheet for Healthcare Providers:  IncredibleEmployment.be  This test is no t yet approved or cleared by the [place] FDA and  has been authorized for detection and/or diagnosis of SARS-CoV-2 by FDA under an Emergency Use Authorization (EUA). This EUA will remain  in effect (meaning this test can be used) for the duration of the COVID-19 declaration under Section 564(b)(1) of the Act, 21 U.S.C.section 360bbb-3(b)(1), unless the authorization is terminated  or revoked sooner.       Influenza A by PCR NEGATIVE NEGATIVE Final   Influenza B by PCR NEGATIVE NEGATIVE Final    Comment: (NOTE) The Xpert Xpress SARS-CoV-2/FLU/RSV plus assay is intended as an aid in the diagnosis of influenza from Nasopharyngeal swab specimens and should not be used as a sole basis for treatment. Nasal washings and aspirates are unacceptable for Xpert Xpress SARS-CoV-2/FLU/RSV testing.  Fact Sheet for Patients: EntrepreneurPulse.com.au  Fact  Sheet for Healthcare Providers: IncredibleEmployment.be  This test is not yet approved or cleared by the [place] FDA and has been authorized for detection and/or diagnosis of SARS-CoV-2 by FDA under an Emergency Use Authorization (EUA). This EUA will remain in effect (meaning this test can be used) for the duration of the COVID-19 declaration under Section 564(b)(1) of the Act, 21 U.S.C. section 360bbb-3(b)(1), unless the authorization is terminated or revoked.  Performed at [facility], [address] [place], [place] [address]   MRSA Next Gen by PCR, Nasal     Status: None   Collection Time: 09/06/21 12:45 AM   Specimen: Nasal Mucosa; Nasal Swab  Result Value Ref Range Status   MRSA by PCR Next Gen NOT DETECTED NOT DETECTED Final    Comment: (NOTE) The GeneXpert MRSA Assay (FDA approved for NASAL specimens only), is one component of a comprehensive MRSA colonization surveillance program. It is not intended to diagnose MRSA infection nor to guide or monitor treatment for MRSA infections. Test performance is not FDA approved in patients less than 10 years old. Performed at [facility], [address] [place], [place] [address]     Lipid Panel Recent Labs    09/06/21 0443  CHOL 134  TRIG 54  HDL 60  CHOLHDL 2.2  VLDL 11  LDLCALC 63    Studies/Results: CT HEAD WO CONTRAST (5MM)  Result Date: 09/06/2021 CLINICAL DATA:  Stroke.  Follow-up. EXAM: CT HEAD WITHOUT CONTRAST TECHNIQUE: Contiguous axial images were obtained from the base of the skull through the vertex without intravenous contrast. COMPARISON:  MRI 09/05/2021. FINDINGS: Brain: No change. No focal abnormality seen affecting the brainstem or cerebellum. Cerebral hemispheres appear normal by CT. No evidence of stroke, intra-axial mass, hemorrhage, hydrocephalus or extra-axial fluid collection. There are some frontal dural calcifications most consistent with hyperostosis.  Small heavily calcified meningioma is not excluded but not favored. No mass-effect upon the brain. Vascular: There is atherosclerotic calcification of the major vessels at the base of the brain. Skull: Negative Sinuses/Orbits: Clear/normal Other: None IMPRESSION: No acute finding by  CT. Normal appearance of the brain other than age related volume loss. Dural calcifications at the right posterior frontal vertex could be insignificant dural calcifications or heavily calcified meningiomas without mass-effect upon the brain. Electronically Signed   By: Nelson Chimes M.D.   On: 09/06/2021 19:20   MR ANGIO HEAD WO CONTRAST  Result Date: 09/05/2021 CLINICAL DATA:  Neuro deficit, acute, stroke suspected EXAM: MRA HEAD WITHOUT CONTRAST TECHNIQUE: Angiographic images of the Circle of Willis were acquired using MRA technique without intravenous contrast. COMPARISON:  No pertinent prior exam. FINDINGS: Anterior circulation: Bilateral intracranial ICAs, MCAs, and ACAS are patent without proximal mid LAD significant stenosis. No aneurysm identified. Posterior circulation: Bilateral intradural vertebral arteries basilar artery, and posterior cerebral arteries are patent without proximal hemodynamically significant stenosis. No aneurysm identified. IMPRESSION: No large vessel occlusion or proximal hemodynamically significant stenosis. Electronically Signed   By: Margaretha Sheffield M.D.   On: 09/05/2021 18:45   [name]  Result Date: 09/05/2021 CLINICAL DATA:  Neuro deficit, acute, stroke suspected. Additional history provided: Acute right-sided weakness. EXAM: MRI HEAD WITHOUT CONTRAST TECHNIQUE: Multiplanar, multiecho pulse sequences of the brain and surrounding structures were obtained without intravenous contrast. COMPARISON:  Prior head CT examinations 09/05/2021 and earlier. Brain MRI 02/18/2021. FINDINGS: Brain: Mild generalized cerebral atrophy. Mild multifocal T2/FLAIR hyperintensity within the cerebral  white matter, nonspecific but compatible with chronic small vessel ischemic disease. There is no acute infarct. No evidence of an intracranial mass. No chronic intracranial blood products. No extra-axial fluid collection. No midline shift. Vascular: Maintained flow voids within the proximal large arterial vessels. Skull and upper cervical spine: Focal suspicious marrow lesion. Sinuses/Orbits: Visualized orbits show no acute finding. No significant paranasal sinus disease. IMPRESSION: No evidence of acute intracranial abnormality. Stable noncontrast MRI appearance of the brain as compared to 02/18/2021. Mild chronic small-vessel ischemic changes within the cerebral white matter. Mild generalized cerebral atrophy. Electronically Signed   By: Kellie Simmering D.O.   On: 09/05/2021 18:08   US Carotid Bilateral  Result Date: 09/06/2021 CLINICAL DATA:  Hypertension, stroke symptoms, syncope and diabetes EXAM: BILATERAL CAROTID DUPLEX ULTRASOUND TECHNIQUE: Pearline Cables scale imaging, color Doppler and duplex ultrasound were performed of bilateral carotid and vertebral arteries in the neck. COMPARISON:  06/19/2021 FINDINGS: Criteria: Quantification of carotid stenosis is based on velocity parameters that correlate the residual internal carotid diameter with NASCET-based stenosis levels, using the diameter of the distal internal carotid lumen as the denominator for stenosis measurement. The following velocity measurements were obtained: RIGHT ICA: 97/28 cm/sec CCA: 61/95 cm/sec SYSTOLIC ICA/CCA RATIO:  1.8 ECA: 40 cm/sec LEFT ICA: 71/22 cm/sec CCA: 09/32 cm/sec SYSTOLIC ICA/CCA RATIO:  1.4 ECA: 30 cm/sec RIGHT CAROTID ARTERY: Minor hypoechoic bifurcation atherosclerosis. Negative for stenosis, velocity elevation, turbulent flow. Degree of narrowing less than 50% by ultrasound criteria. RIGHT VERTEBRAL ARTERY:  Normal antegrade flow LEFT CAROTID ARTERY: Minor hypoechoic intimal thickening and trace hypoechoic plaque formation.  Negative for stenosis, velocity elevation, turbulent flow. Degree of less than 50% by ultrasound criteria. LEFT VERTEBRAL ARTERY:  Normal antegrade flow IMPRESSION: Minor carotid atherosclerosis. Negative for stenosis. Degree of narrowing less than 50% bilaterally by ultrasound criteria. Patent antegrade vertebral flow bilaterally Electronically Signed   By: Jerilynn Mages.  Shick M.D.   On: 09/06/2021 10:27   DG Chest Port 1 View  Result Date: 09/05/2021 CLINICAL DATA:  Stroke code, low blood sugar EXAM: PORTABLE CHEST 1 VIEW COMPARISON:  03/31/2020 FINDINGS: Loop recorder overlying the left hemithorax. The heart size and  mediastinal contours are within normal limits. No focal pulmonary opacity. No pleural effusion or pneumothorax. The visualized skeletal structures are unremarkable. IMPRESSION: No active disease. Electronically Signed   By: Merilyn Baba M.D.   On: 09/05/2021 19:50   ECHOCARDIOGRAM COMPLETE  Result Date: 09/06/2021    ECHOCARDIOGRAM REPORT   Patient Name:   Julie Acevedo Date of Exam: 09/06/2021 Medical Rec #:  [id number]     Height:       64.0 in Accession #:    [id number]    Weight:       114.9 lb Date of Birth:  1951/03/10      BSA:          1.546 m Patient Age:    70 years      BP:           115/64 mmHg Patient Gender: F             HR:           59 bpm. Exam Location:  [facility] Procedure: 2D Echo, Color Doppler and Cardiac Doppler Indications:     I63.9 Stroke  History:         Patient has prior history of Echocardiogram examinations. CKD;                  Risk Factors:Diabetes, Hypertension and Dyslipidemia. Hx of                  COVID-19 in 11/2019.  Sonographer:     Charmayne Sheer Referring Phys:  [id number] Freddi Starr Diagnosing Phys: Kate Sable MD  Sonographer Comments: Technically difficult study due to poor echo windows. Image acquisition challenging due to patient body habitus. IMPRESSIONS  1. Left ventricular ejection fraction, by estimation, is 55 to 60%. The left ventricle has normal  function. The left ventricle has no regional wall motion abnormalities. Left ventricular diastolic parameters were normal.  2. Right ventricular systolic function is normal. The right ventricular size is normal.  3. The mitral valve is normal in structure. No evidence of mitral valve regurgitation.  4. The aortic valve was not well visualized. Aortic valve regurgitation is not visualized.  5. The inferior vena cava is normal in size with <50% respiratory variability, suggesting right atrial pressure of 8 mmHg. FINDINGS  Left Ventricle: Left ventricular ejection fraction, by estimation, is 55 to 60%. The left ventricle has normal function. The left ventricle has no regional wall motion abnormalities. The left ventricular internal cavity size was normal in size. There is  no left ventricular hypertrophy. Left ventricular diastolic parameters were normal. Right Ventricle: The right ventricular size is normal. No increase in right ventricular wall thickness. Right ventricular systolic function is normal. Left Atrium: Left atrial size was normal in size. Right Atrium: Right atrial size was normal in size. Pericardium: There is no evidence of pericardial effusion. Mitral Valve: The mitral valve is normal in structure. No evidence of mitral valve regurgitation. MV peak gradient, 2.9 mmHg. The mean mitral valve gradient is 1.0 mmHg. Tricuspid Valve: The tricuspid valve is normal in structure. Tricuspid valve regurgitation is not demonstrated. Aortic Valve: The aortic valve was not well visualized. Aortic valve regurgitation is not visualized. Aortic valve mean gradient measures 3.0 mmHg. Aortic valve peak gradient measures 5.2 mmHg. Aortic valve area, by VTI measures 2.30 cm. Pulmonic Valve: The pulmonic valve was not well visualized. Pulmonic valve regurgitation is not visualized. Aorta: The aortic root is normal in size and structure.  Venous: The inferior vena cava is normal in size with less than 50% respiratory  variability, suggesting right atrial pressure of 8 mmHg. IAS/Shunts: No atrial level shunt detected by color flow Doppler.  LEFT VENTRICLE PLAX 2D LVIDd:         3.70 cm  Diastology LVIDs:         2.60 cm  LV e' medial:    8.38 cm/s LV PW:         0.80 cm  LV E/e' medial:  9.0 LV IVS:        0.70 cm  LV e' lateral:   12.10 cm/s LVOT diam:     1.90 cm  LV E/e' lateral: 6.2 LV SV:         65 LV SV Index:   42 LVOT Area:     2.84 cm  RIGHT VENTRICLE RV Basal diam:  3.20 cm RV S prime:     14.90 cm/s TAPSE (M-mode): 2.0 cm LEFT ATRIUM         Index      RIGHT ATRIUM           Index LA diam:    3.00 cm 1.94 cm/m RA Area:     11.10 cm                                RA Volume:   24.80 ml  16.05 ml/m  AORTIC VALVE AV Area (Vmax):    2.47 cm AV Area (Vmean):   2.29 cm AV Area (VTI):     2.30 cm AV Vmax:           114.00 cm/s AV Vmean:          82.000 cm/s AV VTI:            0.285 m AV Peak Grad:      5.2 mmHg AV Mean Grad:      3.0 mmHg LVOT Vmax:         99.20 cm/s LVOT Vmean:        66.300 cm/s LVOT VTI:          0.231 m LVOT/AV VTI ratio: 0.81  AORTA Ao Root diam: 3.00 cm MITRAL VALVE MV Area (PHT): 2.91 cm    SHUNTS MV Area VTI:   2.14 cm    Systemic VTI:  0.23 m MV Peak grad:  2.9 mmHg    Systemic Diam: 1.90 cm MV Mean grad:  1.0 mmHg MV Vmax:       0.85 m/s MV Vmean:      49.8 cm/s MV Decel Time: 261 msec MV E velocity: 75.40 cm/s MV A velocity: 74.60 cm/s MV E/A ratio:  1.01 Kate Sable MD Electronically signed by Kate Sable MD Signature Date/Time: 09/06/2021/4:11:58 PM    Final    CT HEAD CODE STROKE WO CONTRAST`  Result Date: 09/05/2021 CLINICAL DATA:  Code stroke. Neuro deficit, acute, stroke suspected. Aphasia. Unresponsive. EXAM: CT HEAD WITHOUT CONTRAST TECHNIQUE: Contiguous axial images were obtained from the base of the skull through the vertex without intravenous contrast. COMPARISON:  08/30/2021 CT.  02/18/2021 MRI. FINDINGS: Brain: No focal abnormality seen affecting the brainstem  or cerebellum. Cerebral hemispheres show minimal small vessel change of the white matter. No sign of acute infarction, mass lesion, hemorrhage, hydrocephalus or extra-axial collection. Vascular: There is atherosclerotic calcification of the major vessels at the base of the brain. Skull: Negative Sinuses/Orbits: Clear/normal  Other: None ASPECTS (Lucas Stroke Program Early CT Score) - Ganglionic level infarction (caudate, lentiform nuclei, internal capsule, insula, M1-M3 cortex): 7 - Supraganglionic infarction (M4-M6 cortex): 3 Total score (0-10 with 10 being normal): 10 IMPRESSION: 1. No acute brain finding. Minimal small-vessel change of the cerebral hemispheric white matter. 2. ASPECTS is 10. 3. These results were called by telephone at the time of interpretation on 09/05/2021 at 1:44 pm to provider Martha Clan , who verbally acknowledged these results. Electronically Signed   By: Nelson Chimes M.D.   On: 09/05/2021 13:47    Medications: Scheduled:   stroke: mapping our early stages of recovery book   Does not apply Once   Chlorhexidine Gluconate Cloth  6 each Topical Daily   pantoprazole (PROTONIX) IV  40 mg Intravenous QHS   rivastigmine  3 mg Oral BID   Continuous:  dextrose 5% lactated ringers 50 mL/hr at 09/06/21 1840   TTE:  1. Left ventricular ejection fraction, by estimation, is 55 to 60%. The  left ventricle has normal function. The left ventricle has no regional  wall motion abnormalities. Left ventricular diastolic parameters were  normal.   2. Right ventricular systolic function is normal. The right ventricular  size is normal.   3. The mitral valve is normal in structure. No evidence of mitral valve  regurgitation.   4. The aortic valve was not well visualized. Aortic valve regurgitation  is not visualized.   5. The inferior vena cava is normal in size with <50% respiratory  variability, suggesting right atrial pressure of 8 mmHg.   Assessment: 70 year old female who  presented on 9/20 with acute onset of right sided weakness.  Her presentation was determined to be most consistent with an acute ischemic stroke and she received TNK in the ED.  1. Exams: - At the time of acute stroke evaluation exam revealed findings best localizable to the MCA territory of the left cerebral hemisphere. Findings were not consistent with LVO.  - Exam on Wednesday showed resolution of previously seen right facial droop, as well as improvement of her speech back to baseline. However, there was still some residual RUE weakness. RLE weakness was unchanged.  - Exam today: Baseline speech and only minimal/subtle right NL flattening. Improved RUE and RLE strength, but still with some weakness.  3. MRI brain: No evidence of acute intracranial abnormality. Stable noncontrast MRI appearance of the brain as compared to 02/18/2021. Mild chronic small-vessel ischemic changes within the cerebral white matter. Mild generalized cerebral atrophy. 4. MRA head: No large vessel occlusion or proximal hemodynamically significant stenosis. 5. Repeat CT 24 hrs post-TNK: No acute finding by CT. Normal appearance of the brain other than age related volume loss. 6. TTE: No mural thrombus or valvular vegetation mentioned in the report.  7. Carotid ultrasound: Minor carotid atherosclerosis. Negative for stenosis. Degree of narrowing less than 50% bilaterally by ultrasound criteria. Patent antegrade vertebral flow bilaterally. 8. Other Neurological conditions: - History of cognitive impairment, suspected to be secondary to possible [name]. - History of seizure in the past. Not on an anticonvulsant. No seizure activity with today's presentation.  - History of hypoglycemic episodes with AMS. Husband states that she has never had focal weakness with one of her hypoglycemic episodes.  6. DDx for her continued right sided weakness despite negative MRI includes conversion disorder (unusual that right facial  droop and speech would recover, with continued RUE and RLE weakness), and MRI-negative left internal capsule or thalamic  stroke.    Recommendations: 1. ASA 81 mg po qd.   2. Will need to be started on a statin. Obtain baseline CK level.  3. Telemetry monitoring 4. Continue rivastigmine  5. PT/OT/Speech. 6. Ultram 25 mg po q6h PRN pain 7. Repeat MRI brain as an outpatient in 2-3 months' time if her right sided weakness has not recovered to baseline.   [place] OT teams are recommending home health OT/PT.  9. Neurohospitalist service will sign off. Please call if there are additional questions.  10. Outpatient Neurology follow up with Dr. [name] of the [facility] clinic   LOS: 2 days   @Electronically  signed: Dr. Kerney Elbe 09/07/2021  8:10 AM

## 2021-09-07 NOTE — Evaluation (Signed)
Physical Therapy Evaluation Patient Details Name: Julie Acevedo MRN: [id number] DOB: 1951-01-14 Today's Date: 09/07/2021  History of Present Illness  70 y.o. female was at hospital for EGD today where she presented with hypoglycemia and stroke-like symptoms resulting in R-sided facial droop and weakness. History of HTN, constipation, DM II, syncope, dementia (possible [name]), lumbosacral radiculopathy, prior roux-en-y gastric bypass, anxiety and depression.  Clinical Impression  Pt received supine in bed, alert and agreeable to PT evaluation. She did report low back pain upon arrival which she states is chronic (for 25 years). She uses RW for initial walk everyday to ease pain in her back; she uses no AD for the remainder of the day unless pain is especially pronounced.  She performed bed mobility Mod I, STS and stand pivot transfer SUP and ambulation CGA progressing to SUP - all without AD. She did hold onto railing for initial 12ft of ambulation - PT would rec beginning session with RW as she does at home. Mild right-sided deficits in strength and coordination remain. She also demo minor instability in standing. Rec HHPT as PT is unaware of pt driving ability to participate in OP. Would benefit from skilled PT to address above deficits and promote optimal return to PLOF.      Recommendations for follow up therapy are one component of a multi-disciplinary discharge planning process, led by the attending physician.  Recommendations may be updated based on patient status, additional functional criteria and insurance authorization.  Follow Up Recommendations Home health PT    Equipment Recommendations  None recommended by PT    Recommendations for Other Services       Precautions / Restrictions Precautions Precautions: Fall Restrictions Weight Bearing Restrictions: No      Mobility  Bed Mobility Overal bed mobility: Modified Independent             General bed  mobility comments: increased time    Transfers Overall transfer level: Needs assistance Equipment used: None Transfers: Sit to/from Stand;Stand Pivot Transfers Sit to Stand: Supervision Stand pivot transfers: Supervision       General transfer comment: SUP for safety. SUE support on arm of recliner during stand-pivot.  Ambulation/Gait Ambulation/Gait assistance: Min guard Gait Distance (Feet): 240 Feet Assistive device: None Gait Pattern/deviations: Step-through pattern;Decreased stance time - right;Decreased stride length;Decreased weight shift to right;Trunk flexed;Narrow base of support Gait velocity: decreased   General Gait Details: Sufficient foot clearance bilaterally with bilateral toe out, R>L. Decreased stance time on RLE. Steadiness on feet improved with distance (pt reports this is the same at baseline due to back pain with initial mobility); pt progressed from CGA to SUP. Occasional UE support on hand rail during initial 76ft.  Stairs            Wheelchair Mobility    Modified Rankin (Stroke Patients Only)       Balance Overall balance assessment: Needs assistance Sitting-balance support: No upper extremity supported;Feet supported Sitting balance-Leahy Scale: Good     Standing balance support: No upper extremity supported Standing balance-Leahy Scale: Fair Standing balance comment: No LOB however mild unsteadiness on feet, especially with initial mobilization.                             Pertinent Vitals/Pain Pain Assessment: No/denies pain    Home Living Family/patient expects to be discharged to:: Private residence Living Arrangements: Spouse/significant other Available Help at Discharge: Family (husband and 2 daughters  who live close by) Type of Home: House Home Access: Stairs to enter Entrance Stairs-Rails: Left Entrance Stairs-Number of Steps: 5 Home Layout: Two level;Able to live on main level with bedroom/bathroom Home  Equipment: Shower seat;Walker - 2 wheels;Wheelchair - manual;Cane - single point;Bedside commode (bathroom is currently being remodled to be fully accessible)      Prior Function Level of Independence: Independent         Comments: Independent with all ADLs and IADLs including driving and community ambulation.     Hand Dominance        Extremity/Trunk Assessment   Upper Extremity Assessment Upper Extremity Assessment: Generalized weakness    Lower Extremity Assessment Lower Extremity Assessment: Generalized weakness;RLE deficits/detail RLE Deficits / Details: general weakness in BLE, R>L. Pain with bilateral testing due to baseline chronic LBP RLE Coordination: decreased fine motor;decreased gross motor       Communication   Communication: No difficulties  Cognition Arousal/Alertness: Awake/alert Behavior During Therapy: WFL for tasks assessed/performed Overall Cognitive Status: Within Functional Limits for tasks assessed                                 General Comments: A&Ox4      General Comments      Exercises     Assessment/Plan    PT Assessment Patient needs continued PT services  PT Problem List Decreased strength;Decreased activity tolerance;Decreased safety awareness;Decreased balance;Decreased mobility;Decreased coordination       PT Treatment Interventions Balance training;Gait training;Neuromuscular re-education;Stair training;Functional mobility training;Patient/family education;Therapeutic activities;Therapeutic exercise    PT Goals (Current goals can be found in the Care Plan section)  Acute Rehab PT Goals Patient Stated Goal: to go home PT Goal Formulation: With patient Time For Goal Achievement: 09/21/21 Potential to Achieve Goals: Good    Frequency 7X/week   Barriers to discharge        Co-evaluation               AM-PAC PT "6 Clicks" Mobility  Outcome Measure Help needed turning from your back to your side  while in a flat bed without using bedrails?: None Help needed moving from lying on your back to sitting on the side of a flat bed without using bedrails?: None Help needed moving to and from a bed to a chair (including a wheelchair)?: A Little Help needed standing up from a chair using your arms (e.g., wheelchair or bedside chair)?: A Little Help needed to walk in hospital room?: A Little Help needed climbing 3-5 steps with a railing? : A Little 6 Click Score: 20    End of Session Equipment Utilized During Treatment: Gait belt Activity Tolerance: Patient tolerated treatment well Patient left: in chair;with chair alarm set;with call bell/phone within reach Nurse Communication: Mobility status PT Visit Diagnosis: Unsteadiness on feet (R26.81);Other abnormalities of gait and mobility (R26.89);Muscle weakness (generalized) (M62.81);Hemiplegia and hemiparesis Hemiplegia - Right/Left: Right Hemiplegia - dominant/non-dominant: Non-dominant Hemiplegia - caused by: Cerebral infarction    Time: 1010-1031 PT Time Calculation (min) (ACUTE ONLY): 21 min   Charges:   PT Evaluation $PT Eval Moderate Complexity: 1 Mod PT Treatments $Therapeutic Activity: 8-22 mins        Julie Acevedo PT, DPT 09/07/21 12:26 PM [phone]   [facility] 09/07/2021, 12:15 PM

## 2021-09-07 NOTE — TOC Progression Note (Signed)
Transition of Care [facility]) - Progression Note    Patient Details  Name: Julie Acevedo MRN: [id number] Date of Birth: 1951-01-16  Transition of Care [facility]) CM/SW [place], RN Phone Number: 09/07/2021, 3:30 PM  Clinical Narrative:   Patient has support at home, spouse and other supportive members, who can assist at discharge.  There are no concerns about transportation to appointments and to the pharmacy.   Patient states she feels able to do a lot of ADLs at home, but she needs PT OT.  [facility] will let rncm know about acceptance.    Patient has walker, cane, bsc at home.  Refuses DME.  Patient has TOC contact information, TOC to follow    Expected Discharge Plan: [place] Barriers to Discharge: Continued Medical Work up  Expected Discharge Plan and Services Expected Discharge Plan: [place]                                               Social Determinants of Health (SDOH) Interventions    Readmission Risk Interventions No flowsheet data found.

## 2021-09-07 NOTE — Progress Notes (Signed)
PROGRESS NOTE  [name]:[id number] DOB: 1950/12/21 DOA: 09/05/2021 PCP: Julie Acevedo., MD   LOS: 2 days   Brief narrative:  Julie Acevedo is a 70 year old woman with history of hypertension, diabetes mellitus type 2, history of impaired memory and concern for [name] presented to hospital for EGD and then subsequently developed altered mental status and altered consciousness.  Rapid response was called in.  Patient was noted to have blood glucose level of 44 and was given D50 and IV fluids.  Blood glucose level improved to 105 but her mental status did not improve so code stroke was called and.  Stat CT head scan was negative neurology decided to administer tPA and the patient was admitted to the ICU.  Subsequently, patient was transferred out of the ICU after tPA administration.  Assessment/Plan:  Active Problems:   Stroke [facility])   Altered mental status secondary to acute stroke Status post tPA protocol.  Neurology on board.  Will be started on aspirin today.  We will get PT evaluation.  CT head scan was negative for hemorrhage in 24 hours of tPA.  Communicated with neurology this morning.  Continue statins.  Duplex ultrasound of the carotid arteries with less than 50% stenosis bilaterally.   Diabetes Melltius Type II Continue sliding scale insulin, Accu-Cheks, diabetic diet.  On metformin at home.   Essential hypertension Hold HCTZ.  Continue to monitor closely.  Hypoglycemia Continue to monitor closely.  Patient is already on oral diet.  Anemia, Chronic - Monitor   Elevated Alk Phos No acute issues.   Hypokalemia Replenished after repletion.  Latest potassium 3.9.   Memory Loss/Dementia - continue rivastigmine   Esophagitis, gastric bypass Status post EGD recently.  Continue PPI  DVT prophylaxis: SCD's Start: 09/05/21 1646  Code Status: Full code  Family Communication: None  Status is: Inpatient  Remains inpatient appropriate because:IV  treatments appropriate due to intensity of illness or inability to take PO and Inpatient level of care appropriate due to severity of illness  Dispo: The patient is from: Home              Anticipated d/c is to: Home likely, will get PT evaluation.              Patient currently is not medically stable to d/c.   Difficult to place patient No  Consultants: Neurology GI PCCM  Procedures: Thrombolytic administration.  Anti-infectives:  None  Anti-infectives (From admission, onward)    None      Subjective: Today, patient was seen and examined at bedside.  Patient denies any headache, dizziness, lightheadedness.  Feels much better today.  Objective: Vitals:   09/07/21 0402 09/07/21 0825  BP: 129/71 123/64  Pulse: 61 65  Resp: 15 17  Temp: 98.7 F (37.1 C) 98.2 F (36.8 C)  SpO2: 100% 100%    Intake/Output Summary (Last 24 hours) at 09/07/2021 1022 Last data filed at 09/07/2021 0500 Gross per 24 hour  Intake 807.37 ml  Output 700 ml  Net 107.37 ml   Filed Weights   09/05/21 1422 09/06/21 0100  Weight: 60.7 kg 52.1 kg   Body mass index is 19.72 kg/m.   Physical Exam:  GENERAL: Patient is alert awake and communicative.  Oriented.  Not in obvious distress. HENT: No scleral pallor or icterus. Pupils equally reactive to light. Oral mucosa is moist NECK: is supple, no gross swelling noted. CHEST: Clear to auscultation. No crackles or wheezes.  Diminished  breath sounds bilaterally. CVS: S1 and S2 heard, no murmur. Regular rate and rhythm.  ABDOMEN: Soft, non-tender, bowel sounds are present. EXTREMITIES: No edema.  Mild right upper extremity weakness CNS: Cranial nerves are intact. No focal motor deficits. SKIN: warm and dry without rashes.  Data Review: I have personally reviewed the following laboratory data and studies,  CBC: Recent Labs  Lab 09/05/21 1414 09/07/21 0532  WBC 5.7 6.6  NEUTROABS 3.7  --   HGB 11.1* 11.9*  HCT 33.7* 37.0  MCV 91.3 93.7   PLT 237 370   Basic Metabolic Panel: Recent Labs  Lab 09/05/21 1414 09/07/21 0532  NA 137 139  K 3.3* 3.9  CL 100 106  CO2 28 26  GLUCOSE 332* 112*  BUN 17 11  CREATININE 1.39* 1.15*  CALCIUM 8.6* 9.0  MG  --  1.8  PHOS  --  3.0   Liver Function Tests: Recent Labs  Lab 09/05/21 1414  AST 17  ALT 7  ALKPHOS 152*  BILITOT 0.6  PROT 6.4*  ALBUMIN 3.3*   No results for input(s): LIPASE, AMYLASE in the last 168 hours. No results for input(s): AMMONIA in the last 168 hours. Cardiac Enzymes: Recent Labs  Lab 09/05/21 1914 09/07/21 0532  CKTOTAL 58 90   BNP (last 3 results) No results for input(s): BNP in the last 8760 hours.  ProBNP (last 3 results) No results for input(s): PROBNP in the last 8760 hours.  CBG: Recent Labs  Lab 09/06/21 1340 09/06/21 2051 09/07/21 0106 09/07/21 0408 09/07/21 0829  GLUCAP 120* 80 83 71 83   Recent Results (from the past 240 hour(s))  Resp Panel by RT-PCR (Flu A&B, Covid) Nasopharyngeal Swab     Status: None   Collection Time: 09/05/21  6:50 PM   Specimen: Nasopharyngeal Swab; Nasopharyngeal(NP) swabs in vial transport medium  Result Value Ref Range Status   SARS Coronavirus 2 by RT PCR NEGATIVE NEGATIVE Final    Comment: (NOTE) SARS-CoV-2 target nucleic acids are NOT DETECTED.  The SARS-CoV-2 RNA is generally detectable in upper respiratory specimens during the acute phase of infection. The lowest concentration of SARS-CoV-2 viral copies this assay can detect is 138 copies/mL. A negative result does not preclude SARS-Cov-2 infection and should not be used as the sole basis for treatment or other patient management decisions. A negative result may occur with  improper specimen collection/handling, submission of specimen other than nasopharyngeal swab, presence of viral mutation(s) within the areas targeted by this assay, and inadequate number of viral copies(<138 copies/mL). A negative result must be combined  with clinical observations, patient history, and epidemiological information. The expected result is Negative.  Fact Sheet for Patients:  EntrepreneurPulse.com.au  Fact Sheet for Healthcare Providers:  IncredibleEmployment.be  This test is no t yet approved or cleared by the [place] FDA and  has been authorized for detection and/or diagnosis of SARS-CoV-2 by FDA under an Emergency Use Authorization (EUA). This EUA will remain  in effect (meaning this test can be used) for the duration of the COVID-19 declaration under Section 564(b)(1) of the Act, 21 U.S.C.section 360bbb-3(b)(1), unless the authorization is terminated  or revoked sooner.       Influenza A by PCR NEGATIVE NEGATIVE Final   Influenza B by PCR NEGATIVE NEGATIVE Final    Comment: (NOTE) The Xpert Xpress SARS-CoV-2/FLU/RSV plus assay is intended as an aid in the diagnosis of influenza from Nasopharyngeal swab specimens and should not be used as a sole basis for  treatment. Nasal washings and aspirates are unacceptable for Xpert Xpress SARS-CoV-2/FLU/RSV testing.  Fact Sheet for Patients: EntrepreneurPulse.com.au  Fact Sheet for Healthcare Providers: IncredibleEmployment.be  This test is not yet approved or cleared by the [place] FDA and has been authorized for detection and/or diagnosis of SARS-CoV-2 by FDA under an Emergency Use Authorization (EUA). This EUA will remain in effect (meaning this test can be used) for the duration of the COVID-19 declaration under Section 564(b)(1) of the Act, 21 U.S.C. section 360bbb-3(b)(1), unless the authorization is terminated or revoked.  Performed at [facility], [address] [place], [place] [address]   MRSA Next Gen by PCR, Nasal     Status: None   Collection Time: 09/06/21 12:45 AM   Specimen: Nasal Mucosa; Nasal Swab  Result Value Ref Range Status   MRSA by PCR Next Gen  NOT DETECTED NOT DETECTED Final    Comment: (NOTE) The GeneXpert MRSA Assay (FDA approved for NASAL specimens only), is one component of a comprehensive MRSA colonization surveillance program. It is not intended to diagnose MRSA infection nor to guide or monitor treatment for MRSA infections. Test performance is not FDA approved in patients less than 11 years old. Performed at [facility], [name], [place], [place] [address]      Studies: CT HEAD WO CONTRAST (5MM)  Result Date: 09/06/2021 CLINICAL DATA:  Stroke.  Follow-up. EXAM: CT HEAD WITHOUT CONTRAST TECHNIQUE: Contiguous axial images were obtained from the base of the skull through the vertex without intravenous contrast. COMPARISON:  MRI 09/05/2021. FINDINGS: Brain: No change. No focal abnormality seen affecting the brainstem or cerebellum. Cerebral hemispheres appear normal by CT. No evidence of stroke, intra-axial mass, hemorrhage, hydrocephalus or extra-axial fluid collection. There are some frontal dural calcifications most consistent with hyperostosis. Small heavily calcified meningioma is not excluded but not favored. No mass-effect upon the brain. Vascular: There is atherosclerotic calcification of the major vessels at the base of the brain. Skull: Negative Sinuses/Orbits: Clear/normal Other: None IMPRESSION: No acute finding by CT. Normal appearance of the brain other than age related volume loss. Dural calcifications at the right posterior frontal vertex could be insignificant dural calcifications or heavily calcified meningiomas without mass-effect upon the brain. Electronically Signed   By: Nelson Chimes M.D.   On: 09/06/2021 19:20   MR ANGIO HEAD WO CONTRAST  Result Date: 09/05/2021 CLINICAL DATA:  Neuro deficit, acute, stroke suspected EXAM: MRA HEAD WITHOUT CONTRAST TECHNIQUE: Angiographic images of the Circle of Willis were acquired using MRA technique without intravenous contrast. COMPARISON:  No pertinent  prior exam. FINDINGS: Anterior circulation: Bilateral intracranial ICAs, MCAs, and ACAS are patent without proximal mid LAD significant stenosis. No aneurysm identified. Posterior circulation: Bilateral intradural vertebral arteries basilar artery, and posterior cerebral arteries are patent without proximal hemodynamically significant stenosis. No aneurysm identified. IMPRESSION: No large vessel occlusion or proximal hemodynamically significant stenosis. Electronically Signed   By: Margaretha Sheffield M.D.   On: 09/05/2021 18:45   [name]  Result Date: 09/05/2021 CLINICAL DATA:  Neuro deficit, acute, stroke suspected. Additional history provided: Acute right-sided weakness. EXAM: MRI HEAD WITHOUT CONTRAST TECHNIQUE: Multiplanar, multiecho pulse sequences of the brain and surrounding structures were obtained without intravenous contrast. COMPARISON:  Prior head CT examinations 09/05/2021 and earlier. Brain MRI 02/18/2021. FINDINGS: Brain: Mild generalized cerebral atrophy. Mild multifocal T2/FLAIR hyperintensity within the cerebral white matter, nonspecific but compatible with chronic small vessel ischemic disease. There is no acute infarct. No evidence of  an intracranial mass. No chronic intracranial blood products. No extra-axial fluid collection. No midline shift. Vascular: Maintained flow voids within the proximal large arterial vessels. Skull and upper cervical spine: Focal suspicious marrow lesion. Sinuses/Orbits: Visualized orbits show no acute finding. No significant paranasal sinus disease. IMPRESSION: No evidence of acute intracranial abnormality. Stable noncontrast MRI appearance of the brain as compared to 02/18/2021. Mild chronic small-vessel ischemic changes within the cerebral white matter. Mild generalized cerebral atrophy. Electronically Signed   By: Kellie Simmering D.O.   On: 09/05/2021 18:08   US Carotid Bilateral  Result Date: 09/06/2021 CLINICAL DATA:  Hypertension, stroke  symptoms, syncope and diabetes EXAM: BILATERAL CAROTID DUPLEX ULTRASOUND TECHNIQUE: Pearline Cables scale imaging, color Doppler and duplex ultrasound were performed of bilateral carotid and vertebral arteries in the neck. COMPARISON:  06/19/2021 FINDINGS: Criteria: Quantification of carotid stenosis is based on velocity parameters that correlate the residual internal carotid diameter with NASCET-based stenosis levels, using the diameter of the distal internal carotid lumen as the denominator for stenosis measurement. The following velocity measurements were obtained: RIGHT ICA: 97/28 cm/sec CCA: 98/92 cm/sec SYSTOLIC ICA/CCA RATIO:  1.8 ECA: 40 cm/sec LEFT ICA: 71/22 cm/sec CCA: 11/94 cm/sec SYSTOLIC ICA/CCA RATIO:  1.4 ECA: 30 cm/sec RIGHT CAROTID ARTERY: Minor hypoechoic bifurcation atherosclerosis. Negative for stenosis, velocity elevation, turbulent flow. Degree of narrowing less than 50% by ultrasound criteria. RIGHT VERTEBRAL ARTERY:  Normal antegrade flow LEFT CAROTID ARTERY: Minor hypoechoic intimal thickening and trace hypoechoic plaque formation. Negative for stenosis, velocity elevation, turbulent flow. Degree of less than 50% by ultrasound criteria. LEFT VERTEBRAL ARTERY:  Normal antegrade flow IMPRESSION: Minor carotid atherosclerosis. Negative for stenosis. Degree of narrowing less than 50% bilaterally by ultrasound criteria. Patent antegrade vertebral flow bilaterally Electronically Signed   By: Jerilynn Mages.  Shick M.D.   On: 09/06/2021 10:27   DG Chest Port 1 View  Result Date: 09/05/2021 CLINICAL DATA:  Stroke code, low blood sugar EXAM: PORTABLE CHEST 1 VIEW COMPARISON:  03/31/2020 FINDINGS: Loop recorder overlying the left hemithorax. The heart size and mediastinal contours are within normal limits. No focal pulmonary opacity. No pleural effusion or pneumothorax. The visualized skeletal structures are unremarkable. IMPRESSION: No active disease. Electronically Signed   By: Merilyn Baba M.D.   On: 09/05/2021  19:50   ECHOCARDIOGRAM COMPLETE  Result Date: 09/06/2021    ECHOCARDIOGRAM REPORT   Patient Name:   CIRCE CHILTON Date of Exam: 09/06/2021 Medical Rec #:  [id number]     Height:       64.0 in Accession #:    [id number]    Weight:       114.9 lb Date of Birth:  1951-09-20      BSA:          1.546 m Patient Age:    72 years      BP:           115/64 mmHg Patient Gender: F             HR:           59 bpm. Exam Location:  [facility] Procedure: 2D Echo, Color Doppler and Cardiac Doppler Indications:     I63.9 Stroke  History:         Patient has prior history of Echocardiogram examinations. CKD;                  Risk Factors:Diabetes, Hypertension and Dyslipidemia. Hx of  COVID-19 in 11/2019.  Sonographer:     Charmayne Sheer Referring Phys:  [id number] Freddi Starr Diagnosing Phys: Kate Sable MD  Sonographer Comments: Technically difficult study due to poor echo windows. Image acquisition challenging due to patient body habitus. IMPRESSIONS  1. Left ventricular ejection fraction, by estimation, is 55 to 60%. The left ventricle has normal function. The left ventricle has no regional wall motion abnormalities. Left ventricular diastolic parameters were normal.  2. Right ventricular systolic function is normal. The right ventricular size is normal.  3. The mitral valve is normal in structure. No evidence of mitral valve regurgitation.  4. The aortic valve was not well visualized. Aortic valve regurgitation is not visualized.  5. The inferior vena cava is normal in size with <50% respiratory variability, suggesting right atrial pressure of 8 mmHg. FINDINGS  Left Ventricle: Left ventricular ejection fraction, by estimation, is 55 to 60%. The left ventricle has normal function. The left ventricle has no regional wall motion abnormalities. The left ventricular internal cavity size was normal in size. There is  no left ventricular hypertrophy. Left ventricular diastolic parameters were normal. Right Ventricle:  The right ventricular size is normal. No increase in right ventricular wall thickness. Right ventricular systolic function is normal. Left Atrium: Left atrial size was normal in size. Right Atrium: Right atrial size was normal in size. Pericardium: There is no evidence of pericardial effusion. Mitral Valve: The mitral valve is normal in structure. No evidence of mitral valve regurgitation. MV peak gradient, 2.9 mmHg. The mean mitral valve gradient is 1.0 mmHg. Tricuspid Valve: The tricuspid valve is normal in structure. Tricuspid valve regurgitation is not demonstrated. Aortic Valve: The aortic valve was not well visualized. Aortic valve regurgitation is not visualized. Aortic valve mean gradient measures 3.0 mmHg. Aortic valve peak gradient measures 5.2 mmHg. Aortic valve area, by VTI measures 2.30 cm. Pulmonic Valve: The pulmonic valve was not well visualized. Pulmonic valve regurgitation is not visualized. Aorta: The aortic root is normal in size and structure. Venous: The inferior vena cava is normal in size with less than 50% respiratory variability, suggesting right atrial pressure of 8 mmHg. IAS/Shunts: No atrial level shunt detected by color flow Doppler.  LEFT VENTRICLE PLAX 2D LVIDd:         3.70 cm  Diastology LVIDs:         2.60 cm  LV e' medial:    8.38 cm/s LV PW:         0.80 cm  LV E/e' medial:  9.0 LV IVS:        0.70 cm  LV e' lateral:   12.10 cm/s LVOT diam:     1.90 cm  LV E/e' lateral: 6.2 LV SV:         65 LV SV Index:   42 LVOT Area:     2.84 cm  RIGHT VENTRICLE RV Basal diam:  3.20 cm RV S prime:     14.90 cm/s TAPSE (M-mode): 2.0 cm LEFT ATRIUM         Index      RIGHT ATRIUM           Index LA diam:    3.00 cm 1.94 cm/m RA Area:     11.10 cm                                RA Volume:   24.80 ml  16.05 ml/m  AORTIC VALVE AV Area (Vmax):    2.47 cm AV Area (Vmean):   2.29 cm AV Area (VTI):     2.30 cm AV Vmax:           114.00 cm/s AV Vmean:          82.000 cm/s AV VTI:             0.285 m AV Peak Grad:      5.2 mmHg AV Mean Grad:      3.0 mmHg LVOT Vmax:         99.20 cm/s LVOT Vmean:        66.300 cm/s LVOT VTI:          0.231 m LVOT/AV VTI ratio: 0.81  AORTA Ao Root diam: 3.00 cm MITRAL VALVE MV Area (PHT): 2.91 cm    SHUNTS MV Area VTI:   2.14 cm    Systemic VTI:  0.23 m MV Peak grad:  2.9 mmHg    Systemic Diam: 1.90 cm MV Mean grad:  1.0 mmHg MV Vmax:       0.85 m/s MV Vmean:      49.8 cm/s MV Decel Time: 261 msec MV E velocity: 75.40 cm/s MV A velocity: 74.60 cm/s MV E/A ratio:  1.01 Kate Sable MD Electronically signed by Kate Sable MD Signature Date/Time: 09/06/2021/4:11:58 PM    Final    CT HEAD CODE STROKE WO CONTRAST`  Result Date: 09/05/2021 CLINICAL DATA:  Code stroke. Neuro deficit, acute, stroke suspected. Aphasia. Unresponsive. EXAM: CT HEAD WITHOUT CONTRAST TECHNIQUE: Contiguous axial images were obtained from the base of the skull through the vertex without intravenous contrast. COMPARISON:  08/30/2021 CT.  02/18/2021 MRI. FINDINGS: Brain: No focal abnormality seen affecting the brainstem or cerebellum. Cerebral hemispheres show minimal small vessel change of the white matter. No sign of acute infarction, mass lesion, hemorrhage, hydrocephalus or extra-axial collection. Vascular: There is atherosclerotic calcification of the major vessels at the base of the brain. Skull: Negative Sinuses/Orbits: Clear/normal Other: None ASPECTS ([place] Stroke Program Early CT Score) - Ganglionic level infarction (caudate, lentiform nuclei, internal capsule, insula, M1-M3 cortex): 7 - Supraganglionic infarction (M4-M6 cortex): 3 Total score (0-10 with 10 being normal): 10 IMPRESSION: 1. No acute brain finding. Minimal small-vessel change of the cerebral hemispheric white matter. 2. ASPECTS is 10. 3. These results were called by telephone at the time of interpretation on 09/05/2021 at 1:44 pm to provider Martha Clan , who verbally acknowledged these results. Electronically  Signed   By: Nelson Chimes M.D.   On: 09/05/2021 13:47      Flora Lipps, MD  [facility] 09/07/2021  If 7PM-7AM, please contact night-coverage

## 2021-09-08 ENCOUNTER — Inpatient Hospital Stay: Payer: Medicare HMO | Admitting: Oncology

## 2021-09-08 ENCOUNTER — Inpatient Hospital Stay: Payer: Medicare HMO

## 2021-09-08 DIAGNOSIS — E876 Hypokalemia: Secondary | ICD-10-CM

## 2021-09-08 DIAGNOSIS — M5416 Radiculopathy, lumbar region: Secondary | ICD-10-CM

## 2021-09-08 LAB — GLUCOSE, CAPILLARY: Glucose-Capillary: 78 mg/dL (ref 70–99)

## 2021-09-08 MED ORDER — ATORVASTATIN CALCIUM 40 MG PO TABS: 40.0000 mg | ORAL_TABLET | Freq: Every day | ORAL | 2 refills | Status: DC

## 2021-09-08 MED ORDER — ASPIRIN 81 MG PO TBEC: 81.0000 mg | DELAYED_RELEASE_TABLET | Freq: Every day | ORAL | 11 refills | Status: DC

## 2021-09-08 MED ORDER — GABAPENTIN 100 MG PO CAPS: 200.0000 mg | ORAL_CAPSULE | Freq: Two times a day (BID) | ORAL | 1 refills | Status: DC

## 2021-09-08 NOTE — Discharge Summary (Addendum)
Physician Discharge Summary  Julie Acevedo HUT:[id number] DOB: 30-Jul-1951 DOA: 09/05/2021  PCP: Jerrol Banana., MD  Admit date: 09/05/2021 Discharge date: 09/08/2021  Admitted From: Home  Discharge disposition: Home PT  Recommendations for Outpatient Follow-Up:   Follow up with your primary care provider in one week.  Check CBC, BMP, magnesium in the next visit Follow up with [facility] clinic neurology, Dr [name] in 3-4 weeks  Discharge Diagnosis:   Principal Problem:   TIA (transient ischemic attack) Active Problems:   DM2 (diabetes mellitus, type 2) ([place])   HLD (hyperlipidemia)   Benign essential HTN   Discharge Condition: Improved.  Diet recommendation: Low sodium, heart healthy.  Carbohydrate-modified.    Wound care: None.  Code status: Full.   History of Present Illness:   Julie Acevedo is a 70 year old woman with history of hypertension, diabetes mellitus type 2, history of impaired memory and concern for [name] presented to hospital for EGD and then subsequently developed altered mental status and altered consciousness.  Rapid response was called in.  Patient was noted to have blood glucose level of 44 and was given D50 and IV fluids.  Blood glucose level improved to 105 but her mental status did not improve so code stroke was called and.  Stat CT head scan was negative neurology decided to administer tPA and the patient was admitted to the ICU.  Subsequently, patient was transferred out of the ICU after tPA administration.  Hospital Course:   Following conditions were addressed during hospitalization as listed below,  Altered mental status secondary to transient ischemic attack Status post tPA protocol.  Neurology followed the patient.  Patient was subsequently started on aspirin and statins.  CT head scan after tPA was negative.    Duplex ultrasound of the carotid arteries with less than 50% stenosis bilaterally.  2D echocardiogram showed preserved  LV function at 55 to 60%.  Chronic kidney disease.  Stage IIIa.  We will need to monitor as outpatient.   Diabetes Mellitus Type II Continue diabetic diet, on metformin at home.   Essential hypertension Resume HCTZ on discharge.  Blood pressure is stable at this time.    Anemia, Chronic - Monitor as outpatient.    Hypokalemia Replenished.   Memory Loss/Dementia - continue rivastigmine   Esophagitis, gastric bypass Status post EGD recently.    Disposition.  At this time, patient is stable for disposition home with outpatient PCP and neurology follow-up.  Medical Consultants:   Neurology GI PCCM  Procedures:    Thrombolytic treatment. Subjective:   Today, patient was seen and examined at bedside.  Patient denies any dizziness, lightheadedness headache increasing weakness.  Discharge Exam:   Vitals:   09/08/21 0448 09/08/21 0824  BP: (!) 147/83 (!) 102/57  Pulse: 75 82  Resp: 18 18  Temp: 98.3 F (36.8 C) 98.3 F (36.8 C)  SpO2: 95% 100%   Vitals:   09/07/21 2341 09/08/21 0445 09/08/21 0448 09/08/21 0824  BP: (!) 116/50 (!) 101/48 (!) 147/83 (!) 102/57  Pulse: 61 70 75 82  Resp: 17 17 18 18   Temp: 98.3 F (36.8 C) 98.2 F (36.8 C) 98.3 F (36.8 C) 98.3 F (36.8 C)  TempSrc:    Oral  SpO2: 100% 100% 95% 100%  Weight:      Height:       General: Alert awake, not in obvious distress, communicative, HENT: pupils equally reacting to light,  No scleral pallor or icterus noted. Oral  mucosa is moist.  Chest:  Clear breath sounds.  Diminished breath sounds bilaterally. No crackles or wheezes.  CVS: S1 &S2 heard. No murmur.  Regular rate and rhythm. Abdomen: Soft, nontender, nondistended.  Bowel sounds are heard.   Extremities: No cyanosis, clubbing or edema.  Peripheral pulses are palpable. Psych: Alert, awake and oriented, normal mood CNS:  No cranial nerve deficits.  Mild right upper extremity weakness  Skin: Warm and dry.  No rashes noted.  The  results of significant diagnostics from this hospitalization (including imaging, microbiology, ancillary and laboratory) are listed below for reference.     Diagnostic Studies:   CT HEAD WO CONTRAST (5MM)  Result Date: 09/06/2021 CLINICAL DATA:  Stroke.  Follow-up. EXAM: CT HEAD WITHOUT CONTRAST TECHNIQUE: Contiguous axial images were obtained from the base of the skull through the vertex without intravenous contrast. COMPARISON:  MRI 09/05/2021. FINDINGS: Brain: No change. No focal abnormality seen affecting the brainstem or cerebellum. Cerebral hemispheres appear normal by CT. No evidence of stroke, intra-axial mass, hemorrhage, hydrocephalus or extra-axial fluid collection. There are some frontal dural calcifications most consistent with hyperostosis. Small heavily calcified meningioma is not excluded but not favored. No mass-effect upon the brain. Vascular: There is atherosclerotic calcification of the major vessels at the base of the brain. Skull: Negative Sinuses/Orbits: Clear/normal Other: None IMPRESSION: No acute finding by CT. Normal appearance of the brain other than age related volume loss. Dural calcifications at the right posterior frontal vertex could be insignificant dural calcifications or heavily calcified meningiomas without mass-effect upon the brain. Electronically Signed   By: Nelson Chimes M.D.   On: 09/06/2021 19:20   US Carotid Bilateral  Result Date: 09/06/2021 CLINICAL DATA:  Hypertension, stroke symptoms, syncope and diabetes EXAM: BILATERAL CAROTID DUPLEX ULTRASOUND TECHNIQUE: Pearline Cables scale imaging, color Doppler and duplex ultrasound were performed of bilateral carotid and vertebral arteries in the neck. COMPARISON:  06/19/2021 FINDINGS: Criteria: Quantification of carotid stenosis is based on velocity parameters that correlate the residual internal carotid diameter with NASCET-based stenosis levels, using the diameter of the distal internal carotid lumen as the denominator for  stenosis measurement. The following velocity measurements were obtained: RIGHT ICA: 97/28 cm/sec CCA: 08/65 cm/sec SYSTOLIC ICA/CCA RATIO:  1.8 ECA: 40 cm/sec LEFT ICA: 71/22 cm/sec CCA: 78/46 cm/sec SYSTOLIC ICA/CCA RATIO:  1.4 ECA: 30 cm/sec RIGHT CAROTID ARTERY: Minor hypoechoic bifurcation atherosclerosis. Negative for stenosis, velocity elevation, turbulent flow. Degree of narrowing less than 50% by ultrasound criteria. RIGHT VERTEBRAL ARTERY:  Normal antegrade flow LEFT CAROTID ARTERY: Minor hypoechoic intimal thickening and trace hypoechoic plaque formation. Negative for stenosis, velocity elevation, turbulent flow. Degree of less than 50% by ultrasound criteria. LEFT VERTEBRAL ARTERY:  Normal antegrade flow IMPRESSION: Minor carotid atherosclerosis. Negative for stenosis. Degree of narrowing less than 50% bilaterally by ultrasound criteria. Patent antegrade vertebral flow bilaterally Electronically Signed   By: Jerilynn Mages.  Shick M.D.   On: 09/06/2021 10:27   ECHOCARDIOGRAM COMPLETE  Result Date: 09/06/2021    ECHOCARDIOGRAM REPORT   Patient Name:   YETTA MARCEAUX Date of Exam: 09/06/2021 Medical Rec #:  [id number]     Height:       64.0 in Accession #:    [id number]    Weight:       114.9 lb Date of Birth:  Apr 25, 1951      BSA:          1.546 m Patient Age:    36 years      BP:  115/64 mmHg Patient Gender: F             HR:           59 bpm. Exam Location:  [facility] Procedure: 2D Echo, Color Doppler and Cardiac Doppler Indications:     I63.9 Stroke  History:         Patient has prior history of Echocardiogram examinations. CKD;                  Risk Factors:Diabetes, Hypertension and Dyslipidemia. Hx of                  COVID-19 in 11/2019.  Sonographer:     Charmayne Sheer Referring Phys:  [id number] Freddi Starr Diagnosing Phys: Kate Sable MD  Sonographer Comments: Technically difficult study due to poor echo windows. Image acquisition challenging due to patient body habitus. IMPRESSIONS  1. Left  ventricular ejection fraction, by estimation, is 55 to 60%. The left ventricle has normal function. The left ventricle has no regional wall motion abnormalities. Left ventricular diastolic parameters were normal.  2. Right ventricular systolic function is normal. The right ventricular size is normal.  3. The mitral valve is normal in structure. No evidence of mitral valve regurgitation.  4. The aortic valve was not well visualized. Aortic valve regurgitation is not visualized.  5. The inferior vena cava is normal in size with <50% respiratory variability, suggesting right atrial pressure of 8 mmHg. FINDINGS  Left Ventricle: Left ventricular ejection fraction, by estimation, is 55 to 60%. The left ventricle has normal function. The left ventricle has no regional wall motion abnormalities. The left ventricular internal cavity size was normal in size. There is  no left ventricular hypertrophy. Left ventricular diastolic parameters were normal. Right Ventricle: The right ventricular size is normal. No increase in right ventricular wall thickness. Right ventricular systolic function is normal. Left Atrium: Left atrial size was normal in size. Right Atrium: Right atrial size was normal in size. Pericardium: There is no evidence of pericardial effusion. Mitral Valve: The mitral valve is normal in structure. No evidence of mitral valve regurgitation. MV peak gradient, 2.9 mmHg. The mean mitral valve gradient is 1.0 mmHg. Tricuspid Valve: The tricuspid valve is normal in structure. Tricuspid valve regurgitation is not demonstrated. Aortic Valve: The aortic valve was not well visualized. Aortic valve regurgitation is not visualized. Aortic valve mean gradient measures 3.0 mmHg. Aortic valve peak gradient measures 5.2 mmHg. Aortic valve area, by VTI measures 2.30 cm. Pulmonic Valve: The pulmonic valve was not well visualized. Pulmonic valve regurgitation is not visualized. Aorta: The aortic root is normal in size and  structure. Venous: The inferior vena cava is normal in size with less than 50% respiratory variability, suggesting right atrial pressure of 8 mmHg. IAS/Shunts: No atrial level shunt detected by color flow Doppler.  LEFT VENTRICLE PLAX 2D LVIDd:         3.70 cm  Diastology LVIDs:         2.60 cm  LV e' medial:    8.38 cm/s LV PW:         0.80 cm  LV E/e' medial:  9.0 LV IVS:        0.70 cm  LV e' lateral:   12.10 cm/s LVOT diam:     1.90 cm  LV E/e' lateral: 6.2 LV SV:         65 LV SV Index:   42 LVOT Area:     2.84 cm  RIGHT VENTRICLE RV Basal diam:  3.20 cm RV S prime:     14.90 cm/s TAPSE (M-mode): 2.0 cm LEFT ATRIUM         Index      RIGHT ATRIUM           Index LA diam:    3.00 cm 1.94 cm/m RA Area:     11.10 cm                                RA Volume:   24.80 ml  16.05 ml/m  AORTIC VALVE AV Area (Vmax):    2.47 cm AV Area (Vmean):   2.29 cm AV Area (VTI):     2.30 cm AV Vmax:           114.00 cm/s AV Vmean:          82.000 cm/s AV VTI:            0.285 m AV Peak Grad:      5.2 mmHg AV Mean Grad:      3.0 mmHg LVOT Vmax:         99.20 cm/s LVOT Vmean:        66.300 cm/s LVOT VTI:          0.231 m LVOT/AV VTI ratio: 0.81  AORTA Ao Root diam: 3.00 cm MITRAL VALVE MV Area (PHT): 2.91 cm    SHUNTS MV Area VTI:   2.14 cm    Systemic VTI:  0.23 m MV Peak grad:  2.9 mmHg    Systemic Diam: 1.90 cm MV Mean grad:  1.0 mmHg MV Vmax:       0.85 m/s MV Vmean:      49.8 cm/s MV Decel Time: 261 msec MV E velocity: 75.40 cm/s MV A velocity: 74.60 cm/s MV E/A ratio:  1.01 Kate Sable MD Electronically signed by Kate Sable MD Signature Date/Time: 09/06/2021/4:11:58 PM    Final      Labs:   Basic Metabolic Panel: Recent Labs  Lab 09/05/21 1414 09/07/21 0532  NA 137 139  K 3.3* 3.9  CL 100 106  CO2 28 26  GLUCOSE 332* 112*  BUN 17 11  CREATININE 1.39* 1.15*  CALCIUM 8.6* 9.0  MG  --  1.8  PHOS  --  3.0   GFR Estimated Creatinine Clearance: 38.5 mL/min (A) (by C-G formula based on SCr  of 1.15 mg/dL (H)). Liver Function Tests: Recent Labs  Lab 09/05/21 1414  AST 17  ALT 7  ALKPHOS 152*  BILITOT 0.6  PROT 6.4*  ALBUMIN 3.3*   No results for input(s): LIPASE, AMYLASE in the last 168 hours. No results for input(s): AMMONIA in the last 168 hours. Coagulation profile Recent Labs  Lab 09/05/21 1414  INR 1.1    CBC: Recent Labs  Lab 09/05/21 1414 09/07/21 0532  WBC 5.7 6.6  NEUTROABS 3.7  --   HGB 11.1* 11.9*  HCT 33.7* 37.0  MCV 91.3 93.7  PLT 237 222   Cardiac Enzymes: Recent Labs  Lab 09/05/21 1914 09/07/21 0532  CKTOTAL 58 90   BNP: Invalid input(s): POCBNP CBG: Recent Labs  Lab 09/07/21 0829 09/07/21 1126 09/07/21 1558 09/07/21 2237 09/08/21 0826  GLUCAP 83 81 76 81 78   D-Dimer No results for input(s): DDIMER in the last 72 hours. Hgb A1c Recent Labs    09/06/21 0443  HGBA1C 5.6   Lipid Profile Recent  Labs    09/06/21 0443  CHOL 134  HDL 60  LDLCALC 63  TRIG 54  CHOLHDL 2.2   Thyroid function studies No results for input(s): TSH, T4TOTAL, T3FREE, THYROIDAB in the last 72 hours.  Invalid input(s): FREET3 Anemia work up No results for input(s): VITAMINB12, FOLATE, FERRITIN, TIBC, IRON, RETICCTPCT in the last 72 hours. Microbiology Recent Results (from the past 240 hour(s))  Resp Panel by RT-PCR (Flu A&B, Covid) Nasopharyngeal Swab     Status: None   Collection Time: 09/05/21  6:50 PM   Specimen: Nasopharyngeal Swab; Nasopharyngeal(NP) swabs in vial transport medium  Result Value Ref Range Status   SARS Coronavirus 2 by RT PCR NEGATIVE NEGATIVE Final    Comment: (NOTE) SARS-CoV-2 target nucleic acids are NOT DETECTED.  The SARS-CoV-2 RNA is generally detectable in upper respiratory specimens during the acute phase of infection. The lowest concentration of SARS-CoV-2 viral copies this assay can detect is 138 copies/mL. A negative result does not preclude SARS-Cov-2 infection and should not be used as the sole  basis for treatment or other patient management decisions. A negative result may occur with  improper specimen collection/handling, submission of specimen other than nasopharyngeal swab, presence of viral mutation(s) within the areas targeted by this assay, and inadequate number of viral copies(<138 copies/mL). A negative result must be combined with clinical observations, patient history, and epidemiological information. The expected result is Negative.  Fact Sheet for Patients:  EntrepreneurPulse.com.au  Fact Sheet for Healthcare Providers:  IncredibleEmployment.be  This test is no t yet approved or cleared by the [place] FDA and  has been authorized for detection and/or diagnosis of SARS-CoV-2 by FDA under an Emergency Use Authorization (EUA). This EUA will remain  in effect (meaning this test can be used) for the duration of the COVID-19 declaration under Section 564(b)(1) of the Act, 21 U.S.C.section 360bbb-3(b)(1), unless the authorization is terminated  or revoked sooner.       Influenza A by PCR NEGATIVE NEGATIVE Final   Influenza B by PCR NEGATIVE NEGATIVE Final    Comment: (NOTE) The Xpert Xpress SARS-CoV-2/FLU/RSV plus assay is intended as an aid in the diagnosis of influenza from Nasopharyngeal swab specimens and should not be used as a sole basis for treatment. Nasal washings and aspirates are unacceptable for Xpert Xpress SARS-CoV-2/FLU/RSV testing.  Fact Sheet for Patients: EntrepreneurPulse.com.au  Fact Sheet for Healthcare Providers: IncredibleEmployment.be  This test is not yet approved or cleared by the [place] FDA and has been authorized for detection and/or diagnosis of SARS-CoV-2 by FDA under an Emergency Use Authorization (EUA). This EUA will remain in effect (meaning this test can be used) for the duration of the COVID-19 declaration under Section 564(b)(1) of the Act,  21 U.S.C. section 360bbb-3(b)(1), unless the authorization is terminated or revoked.  Performed at [facility], [address] [place], [place] [address]   MRSA Next Gen by PCR, Nasal     Status: None   Collection Time: 09/06/21 12:45 AM   Specimen: Nasal Mucosa; Nasal Swab  Result Value Ref Range Status   MRSA by PCR Next Gen NOT DETECTED NOT DETECTED Final    Comment: (NOTE) The GeneXpert MRSA Assay (FDA approved for NASAL specimens only), is one component of a comprehensive MRSA colonization surveillance program. It is not intended to diagnose MRSA infection nor to guide or monitor treatment for MRSA infections. Test performance is not FDA approved in patients less than 57 years old. Performed at [facility],  [place], [place] [address]      Discharge Instructions:   Discharge Instructions     Diet - low sodium heart healthy   Complete by: As directed    Discharge instructions   Complete by: As directed    Continue to take her aspirin and cholesterol medicines as prescribed.  Continue physical therapy at home.  Follow-up with Dr. [name], neurology in [place] clinic in 3 to 4 weeks.  Seek medical attention for worsening symptoms.   Increase activity slowly   Complete by: As directed       Allergies as of 09/08/2021       Reactions   Lotrel [amlodipine Besy-benazepril Hcl] Anaphylaxis   Contrast Media [iodinated Diagnostic Agents] Swelling, Other (See Comments)   Reaction:  Neck swelling    Niacin Hives   Sulfa Antibiotics Hives        Medication List     STOP taking these medications    pantoprazole 40 MG tablet Commonly known as: Protonix   potassium chloride 10 MEQ tablet Commonly known as: KLOR-CON   triamcinolone ointment 0.5 % Commonly known as: KENALOG       TAKE these medications    aspirin 81 MG EC tablet Take 1 tablet (81 mg total) by mouth daily. Swallow whole.   atorvastatin 40 MG  tablet Commonly known as: LIPITOR Take 1 tablet (40 mg total) by mouth daily.   calcitRIOL 0.25 MCG capsule Commonly known as: ROCALTROL Take 0.25 mcg by mouth daily.   cyclobenzaprine 5 MG tablet Commonly known as: FLEXERIL TAKE 1 TABLET BY MOUTH THREE TIMES A DAY AS NEEDED   gabapentin 100 MG capsule Commonly known as: NEURONTIN Take 2 capsules (200 mg total) by mouth 2 (two) times daily. What changed: See the new instructions.   hydrochlorothiazide 25 MG tablet Commonly known as: HYDRODIURIL Take 1 tablet (25 mg total) by mouth daily.   Linzess 290 MCG Caps capsule Generic drug: linaclotide TAKE 1 CAPSULE BY MOUTH DAILY BEFORE BREAKFAST.   magic mouthwash (lidocaine, diphenhydrAMINE, alum & mag hydroxide) suspension Swish and swallow 5 mLs 4 (four) times daily as needed for mouth pain.   Melatonin 10 MG Caps Take 10 mg by mouth at bedtime as needed.   metFORMIN 500 MG tablet Commonly known as: GLUCOPHAGE TAKE 1 TABLET (500 MG TOTAL) BY MOUTH 2 (TWO) TIMES DAILY WITH A MEAL.   metoprolol succinate 25 MG 24 hr tablet Commonly known as: TOPROL-XL Take 1 tablet (25 mg total) by mouth daily.   ondansetron 4 MG tablet Commonly known as: ZOFRAN Take 1 tablet (4 mg total) by mouth every 8 (eight) hours as needed.   polyethylene glycol powder 17 GM/SCOOP powder Commonly known as: GLYCOLAX/MIRALAX Take 17 g by mouth daily as needed for moderate constipation.   Replens Gel Place 1 Applicatorful vaginally 3 (three) times a week.   rivastigmine 3 MG capsule Commonly known as: EXELON Take 3 mg by mouth 2 (two) times daily.   traMADol 50 MG tablet Commonly known as: ULTRAM Take 1 tablet (50 mg total) by mouth every 6 (six) hours as needed. What changed: Another medication with the same name was removed. Continue taking this medication, and follow the directions you see here.   traZODone 50 MG tablet Commonly known as: DESYREL TAKE 1 TABLET (50 MG TOTAL) BY MOUTH AT  BEDTIME AS NEEDED.        Follow-up Information     Jerrol Banana., MD Follow up  in 1 week(s).   Specialty: Family Medicine Contact information: [address] [place] [address] [phone]         Yolonda Kida, MD .   Specialties: Cardiology, Internal Medicine Contact information: [place] [address] [phone]         Vladimir Crofts, MD Follow up in 4 week(s).   Specialty: Neurology Why: stroke followup Contact information: [address] [facility] [place] [address] [phone]                  Time coordinating discharge: 39 minutes  Signed:  Markcus Lazenby  [facility] 09/08/2021, 8:56 AM

## 2021-09-08 NOTE — Care Management Important Message (Signed)
Important Message  Patient Details  Name: Julie Acevedo MRN: [id number] Date of Birth: [birth year]   Medicare Important Message Given:  Yes     [name] [place] 09/08/2021, 10:33 AM

## 2021-09-08 NOTE — TOC Progression Note (Signed)
Transition of Care [facility]) - Progression Note    Patient Details  Name: THU BAGGETT MRN: [id number] Date of Birth: 1951/08/01  Transition [facility] CM/SW [place], RN Phone Number: 09/08/2021, 10:29 AM  Clinical Narrative:   Patient will discharge with [facility], who will begins ervices on Tuesday as per Cristie Hem.  Declines DME, has family support    Expected Discharge Plan: [place] Barriers to Discharge: Continued Medical Work up  Expected Discharge Plan and Services Expected Discharge Plan: [place]         Expected Discharge Date: 09/08/21                                     Social Determinants of Health (SDOH) Interventions    Readmission Risk Interventions No flowsheet data found.

## 2021-09-09 DIAGNOSIS — F0280 Dementia in other diseases classified elsewhere without behavioral disturbance: Secondary | ICD-10-CM | POA: Diagnosis not present

## 2021-09-09 DIAGNOSIS — D649 Anemia, unspecified: Secondary | ICD-10-CM | POA: Diagnosis not present

## 2021-09-09 DIAGNOSIS — G3183 Dementia with Lewy bodies: Secondary | ICD-10-CM | POA: Diagnosis not present

## 2021-09-09 DIAGNOSIS — I69351 Hemiplegia and hemiparesis following cerebral infarction affecting right dominant side: Secondary | ICD-10-CM | POA: Diagnosis not present

## 2021-09-09 DIAGNOSIS — E119 Type 2 diabetes mellitus without complications: Secondary | ICD-10-CM | POA: Diagnosis not present

## 2021-09-09 DIAGNOSIS — K5790 Diverticulosis of intestine, part unspecified, without perforation or abscess without bleeding: Secondary | ICD-10-CM | POA: Diagnosis not present

## 2021-09-09 DIAGNOSIS — M1990 Unspecified osteoarthritis, unspecified site: Secondary | ICD-10-CM | POA: Diagnosis not present

## 2021-09-09 DIAGNOSIS — I10 Essential (primary) hypertension: Secondary | ICD-10-CM | POA: Diagnosis not present

## 2021-09-09 DIAGNOSIS — I252 Old myocardial infarction: Secondary | ICD-10-CM | POA: Diagnosis not present

## 2021-09-13 ENCOUNTER — Other Ambulatory Visit: Payer: Self-pay

## 2021-09-13 ENCOUNTER — Ambulatory Visit (INDEPENDENT_AMBULATORY_CARE_PROVIDER_SITE_OTHER): Payer: Medicare HMO | Admitting: Family Medicine

## 2021-09-13 VITALS — BP 120/60 | HR 65 | Temp 98.5°F | Resp 20 | Wt 120.0 lb

## 2021-09-13 DIAGNOSIS — E1122 Type 2 diabetes mellitus with diabetic chronic kidney disease: Secondary | ICD-10-CM | POA: Diagnosis not present

## 2021-09-13 DIAGNOSIS — K219 Gastro-esophageal reflux disease without esophagitis: Secondary | ICD-10-CM

## 2021-09-13 DIAGNOSIS — G459 Transient cerebral ischemic attack, unspecified: Secondary | ICD-10-CM

## 2021-09-13 DIAGNOSIS — R55 Syncope and collapse: Secondary | ICD-10-CM | POA: Diagnosis not present

## 2021-09-13 DIAGNOSIS — E782 Mixed hyperlipidemia: Secondary | ICD-10-CM | POA: Diagnosis not present

## 2021-09-13 DIAGNOSIS — Z23 Encounter for immunization: Secondary | ICD-10-CM

## 2021-09-13 DIAGNOSIS — I10 Essential (primary) hypertension: Secondary | ICD-10-CM

## 2021-09-13 DIAGNOSIS — N1832 Type 2 diabetes mellitus with diabetic chronic kidney disease: Secondary | ICD-10-CM

## 2021-09-13 NOTE — Progress Notes (Signed)
Established patient visit   Patient: Julie Acevedo   DOB: 08/09/1951   [name]  MRN: [id number] Visit Date: 09/13/2021  Today's healthcare provider: [name], MD   Chief Complaint  Patient presents with   Hospitalization Follow-up   Subjective    HPI  Patient was getting ready to undergo endoscopy and had syncope and collapse.  She was diagnosed with TIA.  Work-up was otherwise unremarkable.  She is now back to normal but continues to have her swallowing difficulties for which she was having the endoscopy. Follow up Hospitalization  Patient was admitted to [facility] on 09/05/2021 and discharged on 09/08/2021. She was treated for TIA. Treatment for this included; see notes in chart.  She reports good compliance with treatment. She reports this condition is improved.     Medications: Outpatient Medications Prior to Visit  Medication Sig   aspirin EC 81 MG EC tablet Take 1 tablet (81 mg total) by mouth daily. Swallow whole.   atorvastatin (LIPITOR) 40 MG tablet Take 1 tablet (40 mg total) by mouth daily.   calcitRIOL (ROCALTROL) 0.25 MCG capsule Take 0.25 mcg by mouth daily.   cyclobenzaprine (FLEXERIL) 5 MG tablet TAKE 1 TABLET BY MOUTH THREE TIMES A DAY AS NEEDED   gabapentin (NEURONTIN) 100 MG capsule Take 2 capsules (200 mg total) by mouth 2 (two) times daily.   hydrochlorothiazide (HYDRODIURIL) 25 MG tablet Take 1 tablet (25 mg total) by mouth daily.   LINZESS 290 MCG CAPS capsule TAKE 1 CAPSULE BY MOUTH DAILY BEFORE BREAKFAST.   magic mouthwash (lidocaine, diphenhydrAMINE, alum & mag hydroxide) suspension Swish and swallow 5 mLs 4 (four) times daily as needed for mouth pain. (Patient not taking: No sig reported)   Melatonin 10 MG CAPS Take 10 mg by mouth at bedtime as needed.   metFORMIN (GLUCOPHAGE) 500 MG tablet TAKE 1 TABLET (500 MG TOTAL) BY MOUTH 2 (TWO) TIMES DAILY WITH A MEAL.   metoprolol succinate (TOPROL-XL) 25 MG 24 hr tablet Take 1 tablet (25 mg  total) by mouth daily.   ondansetron (ZOFRAN) 4 MG tablet Take 1 tablet (4 mg total) by mouth every 8 (eight) hours as needed.   polyethylene glycol powder (GLYCOLAX/MIRALAX) 17 GM/SCOOP powder Take 17 g by mouth daily as needed for moderate constipation.   rivastigmine (EXELON) 3 MG capsule Take 3 mg by mouth 2 (two) times daily.   traMADol (ULTRAM) 50 MG tablet Take 1 tablet (50 mg total) by mouth every 6 (six) hours as needed.   traZODone (DESYREL) 50 MG tablet TAKE 1 TABLET (50 MG TOTAL) BY MOUTH AT BEDTIME AS NEEDED.   Vaginal Lubricant (REPLENS) GEL Place 1 Applicatorful vaginally 3 (three) times a week.   No facility-administered medications prior to visit.    Review of Systems  Constitutional:  Negative for appetite change, chills, fatigue and fever.  Respiratory:  Negative for chest tightness and shortness of breath.   Cardiovascular:  Negative for chest pain and palpitations.  Gastrointestinal:  Negative for abdominal pain, nausea and vomiting.  Neurological:  Negative for dizziness and weakness.       Objective    BP 120/60   Pulse 65   Temp 98.5 F (36.9 C)   Resp 20   Wt 120 lb (54.4 kg)   BMI 20.60 kg/m  BP Readings from Last 3 Encounters:  09/13/21 120/60  09/08/21 (!) 102/57  09/05/21 129/66   Wt Readings from Last 3 Encounters:  09/13/21  120 lb (54.4 kg)  09/06/21 114 lb 13.8 oz (52.1 kg)  09/05/21 127 lb (57.6 kg)      Physical Exam Vitals reviewed.  Constitutional:      Appearance: She is well-developed.  HENT:     Head: Normocephalic and atraumatic.     Right Ear: Tympanic membrane and external ear normal.     Left Ear: Tympanic membrane and external ear normal.     Nose: Nose normal.     Mouth/Throat:     Mouth: Mucous membranes are moist.     Pharynx: Oropharynx is clear.  Eyes:     General: No scleral icterus.    Conjunctiva/sclera: Conjunctivae normal.     Pupils: Pupils are equal, round, and reactive to light.  Neck:     Thyroid: No  thyromegaly.  Cardiovascular:     Rate and Rhythm: Normal rate and regular rhythm.     Heart sounds: Normal heart sounds.  Pulmonary:     Effort: Pulmonary effort is normal.     Breath sounds: Normal breath sounds.  Abdominal:     Palpations: Abdomen is soft.  Lymphadenopathy:     Cervical: No cervical adenopathy.  Skin:    General: Skin is warm and dry.  Neurological:     General: No focal deficit present.     Mental Status: She is alert and oriented to person, place, and time.  Psychiatric:        Mood and Affect: Mood normal.        Behavior: Behavior normal.        Thought Content: Thought content normal.        Judgment: Judgment normal.      No results found for any visits on 09/13/21.  Assessment & Plan     1. Syncope, unspecified syncope type To be secondary to TIA.  Work-up from hospital in chart. - CBC with Differential/Platelet  2. TIA (transient ischemic attack) All risk factors treated.  Blood pressure excellent today. - Magnesium  3. Type 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, without long-term current use of insulin (HCC) Well controlled with last A1c being 5.6 - Renal function panel  4. Mixed hyperlipidemia Atorvastatin 40 excellent control  5. Essential hypertension Excellent control of blood pressure  6. Need for influenza vaccination  - Flu Vaccine QUAD High Dose(Fluad) 7.  GERD Work-up pending  No follow-ups on file.      I, [name], MD, have reviewed all documentation for this visit. The documentation on 09/15/21 for the exam, diagnosis, procedures, and orders are all accurate and complete.    Olivea Sonnen Cranford Mon, MD  [facility] [phone] (phone) [phone] (fax)  [place]

## 2021-09-14 DIAGNOSIS — I69351 Hemiplegia and hemiparesis following cerebral infarction affecting right dominant side: Secondary | ICD-10-CM | POA: Diagnosis not present

## 2021-09-14 DIAGNOSIS — I252 Old myocardial infarction: Secondary | ICD-10-CM | POA: Diagnosis not present

## 2021-09-14 DIAGNOSIS — I10 Essential (primary) hypertension: Secondary | ICD-10-CM | POA: Diagnosis not present

## 2021-09-14 DIAGNOSIS — R4189 Other symptoms and signs involving cognitive functions and awareness: Secondary | ICD-10-CM | POA: Diagnosis not present

## 2021-09-14 DIAGNOSIS — F0280 Dementia in other diseases classified elsewhere without behavioral disturbance: Secondary | ICD-10-CM | POA: Diagnosis not present

## 2021-09-14 DIAGNOSIS — Z8673 Personal history of transient ischemic attack (TIA), and cerebral infarction without residual deficits: Secondary | ICD-10-CM | POA: Diagnosis not present

## 2021-09-14 DIAGNOSIS — E119 Type 2 diabetes mellitus without complications: Secondary | ICD-10-CM | POA: Diagnosis not present

## 2021-09-14 DIAGNOSIS — K5790 Diverticulosis of intestine, part unspecified, without perforation or abscess without bleeding: Secondary | ICD-10-CM | POA: Diagnosis not present

## 2021-09-14 DIAGNOSIS — G3183 Dementia with Lewy bodies: Secondary | ICD-10-CM | POA: Diagnosis not present

## 2021-09-14 DIAGNOSIS — D649 Anemia, unspecified: Secondary | ICD-10-CM | POA: Diagnosis not present

## 2021-09-14 DIAGNOSIS — M1990 Unspecified osteoarthritis, unspecified site: Secondary | ICD-10-CM | POA: Diagnosis not present

## 2021-09-14 LAB — CBC WITH DIFFERENTIAL/PLATELET
Basophils Absolute: 0 10*3/uL (ref 0.0–0.2)
Basos: 0 %
EOS (ABSOLUTE): 0.1 10*3/uL (ref 0.0–0.4)
Eos: 1 %
Hematocrit: 33.5 % — ABNORMAL LOW (ref 34.0–46.6)
Hemoglobin: 11.3 g/dL (ref 11.1–15.9)
Immature Grans (Abs): 0 10*3/uL (ref 0.0–0.1)
Immature Granulocytes: 0 %
Lymphocytes Absolute: 2.1 10*3/uL (ref 0.7–3.1)
Lymphs: 25 %
MCH: 29.4 pg (ref 26.6–33.0)
MCHC: 33.7 g/dL (ref 31.5–35.7)
MCV: 87 fL (ref 79–97)
Monocytes Absolute: 0.6 10*3/uL (ref 0.1–0.9)
Monocytes: 7 %
Neutrophils Absolute: 5.7 10*3/uL (ref 1.4–7.0)
Neutrophils: 67 %
Platelets: 262 10*3/uL (ref 150–450)
RBC: 3.85 x10E6/uL (ref 3.77–5.28)
RDW: 13.2 % (ref 11.7–15.4)
WBC: 8.6 10*3/uL (ref 3.4–10.8)

## 2021-09-14 LAB — RENAL FUNCTION PANEL
Albumin: 3.9 g/dL (ref 3.8–4.8)
BUN/Creatinine Ratio: 9 — ABNORMAL LOW (ref 12–28)
BUN: 13 mg/dL (ref 8–27)
CO2: 26 mmol/L (ref 20–29)
Calcium: 8.9 mg/dL (ref 8.7–10.3)
Chloride: 102 mmol/L (ref 96–106)
Creatinine, Ser: 1.42 mg/dL — ABNORMAL HIGH (ref 0.57–1.00)
Glucose: 72 mg/dL (ref 70–99)
Phosphorus: 3.5 mg/dL (ref 3.0–4.3)
Potassium: 4.4 mmol/L (ref 3.5–5.2)
Sodium: 141 mmol/L (ref 134–144)
eGFR: 40 mL/min/{1.73_m2} — ABNORMAL LOW (ref >59)

## 2021-09-14 LAB — MAGNESIUM: Magnesium: 1.8 mg/dL (ref 1.6–2.3)

## 2021-09-18 ENCOUNTER — Telehealth: Payer: Self-pay

## 2021-09-18 ENCOUNTER — Other Ambulatory Visit: Payer: Self-pay | Admitting: Family Medicine

## 2021-09-18 ENCOUNTER — Telehealth: Payer: Medicare HMO

## 2021-09-18 DIAGNOSIS — K130 Diseases of lips: Secondary | ICD-10-CM

## 2021-09-18 DIAGNOSIS — K219 Gastro-esophageal reflux disease without esophagitis: Secondary | ICD-10-CM

## 2021-09-18 NOTE — Progress Notes (Signed)
    Chronic Care Management Pharmacy Assistant   Name: Julie Acevedo  MRN: [id number] DOB: 12/05/1951  Patient called to be reminded of her appointment with [name], CPP on 09/19/2021 @1000  via telephone.  Patient aware of appointment date, time, and type of appointment (either telephone or in person). Patient aware to have/bring all medications, supplements, blood pressure and/or blood sugar logs to visit.  Questions: Are there any concerns you would like to discuss during your office visit? Patient states not that she can think of at this time.   Are you having any problems obtaining your medications? (Whether it pharmacy issues or cost) None  Star Rating Drug: Atorvastatin 40 mg No fill history Metformin 500 mg last filled on 07/16/2021 for a 90-Day supply with [facility]  Any gaps in medications fill history? Yes  Care Gaps: Diabetic Eye Exam Urine Microalbumin COVID-19 Vaccine Booster [place], CPA/CMA Clinical Pharmacist Assistant Phone: [phone]

## 2021-09-19 ENCOUNTER — Ambulatory Visit (INDEPENDENT_AMBULATORY_CARE_PROVIDER_SITE_OTHER): Payer: Medicare HMO

## 2021-09-19 ENCOUNTER — Telehealth: Payer: Self-pay

## 2021-09-19 ENCOUNTER — Telehealth: Payer: Self-pay | Admitting: Family Medicine

## 2021-09-19 DIAGNOSIS — I10 Essential (primary) hypertension: Secondary | ICD-10-CM

## 2021-09-19 DIAGNOSIS — E782 Mixed hyperlipidemia: Secondary | ICD-10-CM

## 2021-09-19 DIAGNOSIS — R4189 Other symptoms and signs involving cognitive functions and awareness: Secondary | ICD-10-CM

## 2021-09-19 NOTE — Patient Instructions (Signed)
Visit Information It was great speaking with you today!  Please let me know if you have any questions about our visit.   Goals Addressed             This Visit's Progress    Monitor and Manage My Blood Sugar-Diabetes Type 2   On track    Timeframe:  Long-Range Goal Priority:  High Start Date:  02/27/2021                           Expected End Date: 08/30/2022                      Follow Up within 90 days   - check blood sugar at prescribed times - check blood sugar if I feel it is too high or too low    Why is this important?   Checking your blood sugar at home helps to keep it from getting very high or very low.  Writing the results in a diary or log helps the doctor know how to care for you.  Your blood sugar log should have the time, date and the results.  Also, write down the amount of insulin or other medicine that you take.  Other information, like what you ate, exercise done and how you were feeling, will also be helpful.     Notes:         Patient Care Plan: General Pharmacy (Adult)     Problem Identified: Hypertension, Hyperlipidemia, Diabetes, GERD and Chronic Pain   Priority: High     Long-Range Goal: Patient-Specific Goal   Start Date: 02/27/2021  Expected End Date: 09/19/2022  This Visit's Progress: On track  Recent Progress: On track  Priority: High  Note:   Current Barriers:  Unable to achieve control of cholesterol  Suboptimal therapeutic regimen for hypertension  Pharmacist Clinical Goal(s):  patient will achieve control of cholesterol as evidenced by LDL less than 70 maintain control of blood pressure as evidenced by BP less than 140/90 and improvement of dizziness symptoms  through collaboration with PharmD and provider.   Interventions: 1:1 collaboration with Jerrol Banana., MD regarding development and update of comprehensive plan of care as evidenced by provider attestation and co-signature Inter-disciplinary care team collaboration  (see longitudinal plan of care) Comprehensive medication review performed; medication list updated in electronic medical record  Hypertension (BP goal <140/90) -Not ideally controlled -Current treatment: HCTZ 25 mg daily  Metoprolol XL 25 mg daily  -Medications previously tried: NA  -Current home readings: Systolic 81-157W  -Current dietary habits: Patient is not following any specific dietary regimen -Current exercise habits: Patient is not following any specific exerciseregimen -Reports hypotensive symptoms -Patient reports frequent dizziness, especially when bending over.  -Continue current medications  Hyperlipidemia: (LDL goal < 70) -History of MI 1999 -History of TIA Sep 2022 -Uncontrolled -Current treatment: Atorvastatin 40 mg daily -Medications previously tried: NA  -Continue current medications   Diabetes (A1c goal <7%) -Controlled -Current medications: Metformin 500 mg daily  -Medications previously tried: NA  -Current home glucose readings fasting glucose: NA post prandial glucose: NA -Denies hypoglycemic/hyperglycemic symptoms -Educated on Benefits of routine self-monitoring of blood sugar; Carbohydrate counting and/or plate method -Continue current medications  Dementia (Goal: Prevent further decline in memory) -Managed by Dr. [name] -Uncontrolled -Current treatment  Rivastigmine 3 mg twice daily  -Medications previously tried: NA -Neurology recommended folic acid 1mg  daily, was not started.  -Recommended  starting folic acid 4 mg daily  -Continue current medications  Patient Goals/Self-Care Activities patient will:  - check blood pressure 2-3 times weekly, document, and provide at future appointments target a minimum of 150 minutes of moderate intensity exercise weekly  Follow Up Plan: Telephone follow up appointment with care management team member scheduled for:  12/26/2021 at 8:30 AM      Patient agreed to services and verbal consent obtained.    The patient verbalized understanding of instructions, educational materials, and care plan provided today and declined offer to receive copy of patient instructions, educational materials, and care plan.   [name], PharmD, [facility] [place], [place] [phone]

## 2021-09-19 NOTE — Progress Notes (Deleted)
Chronic Care Management Pharmacy Note  09/19/2021 Name:  Julie Acevedo MRN:  [id number] DOB:  1951/08/12  Summary: Patient reports she is doing well overall, she does continue to have postural dizziness. She is Worried about results of MRI scan.   Recommendations/Changes made from today's visit: Continue Current Medications  Plan: CPP follow-up in 2 weeks  Subjective: Julie Acevedo is an 70 y.o. year old female who is a primary patient of Jerrol Banana., MD.  The CCM team was consulted for assistance with disease management and care coordination needs.    Engaged with patient by telephone for follow up visit in response to provider referral for pharmacy case management and/or care coordination services.   Consent to Services:  The patient was given information about Chronic Care Management services, agreed to services, and gave verbal consent prior to initiation of services.  Please see initial visit note for detailed documentation.   Patient Care Team: Jerrol Banana., MD as PCP - General (Family Medicine) Yolonda Kida, MD as PCP - Cardiology (Cardiology) Lorelee Cover., MD as Consulting Physician (Ophthalmology) Bjorn Loser, MD as Consulting Physician (Urology) Bary Castilla, Forest Gleason, MD as Consulting Physician (General Surgery) Jules Husbands, MD as Consulting Physician (General Surgery) Gardiner Barefoot, DPM as Consulting Physician (Podiatry) Gae Dry, MD as Referring Physician (Obstetrics and Gynecology) Vladimir Crofts, MD as Consulting Physician (Neurology) Randon Goldsmith, OT/L as Occupational Therapist (Occupational Therapy)  Recent office visits: 04/25/21: Patient presented to Dr. Rosanna Randy for allergic rhinitis. No medication changes noted.   Recent consult visits: 05/04/21: Patient presented to Dr. Lanora Manis (Nephrology). HCTZ decreased to 12.5 mg daily.   Hospital visits: 09/14/21: Patient presented to Dr. [name] (Neurology)    04/18/21: Patient presented to ED for Uvulitis 04/04/21: Patient presented to ED for abdominal pain  03/10/21: Patient presented to ED for ankle swelling   Objective:  Lab Results  Component Value Date   CREATININE 1.42 (H) 09/13/2021   BUN 13 09/13/2021   GFRNONAA 52 (L) 09/07/2021   GFRAA 42 (L) 12/05/2020   NA 141 09/13/2021   K 4.4 09/13/2021   CALCIUM 8.9 09/13/2021   CO2 26 09/13/2021    Lab Results  Component Value Date/Time   HGBA1C 5.6 09/06/2021 04:43 AM   HGBA1C 5.3 08/29/2021 09:03 AM   HGBA1C 5.5 03/06/2021 04:26 PM   HGBA1C 6.3 01/18/2014 06:32 AM   MICROALBUR 20 06/23/2020 02:10 PM   MICROALBUR 50 11/18/2017 09:33 AM    Last diabetic Eye exam:  Lab Results  Component Value Date/Time   HMDIABEYEEXA No Retinopathy 12/03/2019 12:00 AM    Last diabetic Foot exam: No results found for: HMDIABFOOTEX   Lab Results  Component Value Date   CHOL 134 09/06/2021   HDL 60 09/06/2021   LDLCALC 63 09/06/2021   TRIG 54 09/06/2021   CHOLHDL 2.2 09/06/2021    Hepatic Function Latest Ref Rng & Units 09/13/2021 09/05/2021 06/19/2021  Total Protein 6.5 - 8.1 g/dL - 6.4(L) 6.6  Albumin 3.8 - 4.8 g/dL 3.9 3.3(L) 3.4(L)  AST 15 - 41 U/L - 17 13(L)  ALT 0 - 44 U/L - 7 8  Alk Phosphatase 38 - 126 U/L - 152(H) 168(H)  Total Bilirubin 0.3 - 1.2 mg/dL - 0.6 0.4    Lab Results  Component Value Date/Time   TSH 1.896 06/28/2021 01:23 PM   TSH 3.730 12/05/2020 08:55 AM   TSH 2.170 08/18/2020 11:36 AM   FREET4 0.91  06/28/2021 01:23 PM    CBC Latest Ref Rng & Units 09/13/2021 09/07/2021 09/05/2021  WBC 3.4 - 10.8 x10E3/uL 8.6 6.6 5.7  Hemoglobin 11.1 - 15.9 g/dL 11.3 11.9(L) 11.1(L)  Hematocrit 34.0 - 46.6 % 33.5(L) 37.0 33.7(L)  Platelets 150 - 450 x10E3/uL 262 222 237    Lab Results  Component Value Date/Time   VD25OH 8.7 (L) 06/16/2019 03:51 PM    Clinical ASCVD: Yes  The ASCVD Risk score (Arnett DK, et al., 2019) failed to calculate for the following reasons:   The  patient has a prior MI or stroke diagnosis    Depression screen [facility] 2/9 06/01/2021 05/29/2021 04/26/2021  Decreased Interest 0 0 0  Down, Depressed, Hopeless '1 2 1  ' PHQ - 2 Score '1 2 1  ' Altered sleeping - 2 -  Tired, decreased energy - 3 -  Change in appetite - 3 -  Feeling bad or failure about yourself  - 2 -  Trouble concentrating - 2 -  Moving slowly or fidgety/restless - 2 -  Suicidal thoughts - 0 -  PHQ-9 Score - 16 -  Difficult doing work/chores - Somewhat difficult -  Some recent data might be hidden    Social History   Tobacco Use  Smoking Status Never  Smokeless Tobacco Never   BP Readings from Last 3 Encounters:  09/13/21 120/60  09/08/21 (!) 102/57  09/05/21 129/66   Pulse Readings from Last 3 Encounters:  09/13/21 65  09/08/21 82  09/05/21 84   Wt Readings from Last 3 Encounters:  09/13/21 120 lb (54.4 kg)  09/06/21 114 lb 13.8 oz (52.1 kg)  09/05/21 127 lb (57.6 kg)    Assessment/Interventions: Review of patient past medical history, allergies, medications, health status, including review of consultants reports, laboratory and other test data, was performed as part of comprehensive evaluation and provision of chronic care management services.   SDOH:  (Social Determinants of Health) assessments and interventions performed: Yes    CCM Care Plan  Allergies  Allergen Reactions   Lotrel [Amlodipine Besy-Benazepril Hcl] Anaphylaxis   Contrast Media [Iodinated Diagnostic Agents] Swelling and Other (See Comments)    Reaction:  Neck swelling    Niacin Hives   Sulfa Antibiotics Hives    Medications Reviewed Today     Reviewed by Francene Finders, CPhT (Pharmacy Technician) on 09/05/21 at 1452  Med List Status: Complete   Medication Order Taking? Sig Documenting Provider Last Dose Status Informant  calcitRIOL (ROCALTROL) 0.25 MCG capsule [id number] Yes Take 0.25 mcg by mouth daily. [provider] Unknown Unknown Active Pharmacy Records   cyclobenzaprine (FLEXERIL) 5 MG tablet [id number] Yes TAKE 1 TABLET BY MOUTH THREE TIMES A DAY AS NEEDED Jerrol Banana., MD Unknown PRN Active Pharmacy Records  gabapentin (NEURONTIN) 100 MG capsule [id number] Yes TAKE 2 CAPSULES (200 MG TOTAL) BY MOUTH 3 (THREE) TIMES DAILY.  Patient taking differently: Take 200 mg by mouth 2 (two) times daily.   Jerrol Banana., MD Unknown Unknown Active Pharmacy Records           Med Note Michaelle Birks, Cathe Mons A   Mon Feb 27, 2021  1:31 PM)    hydrochlorothiazide (HYDRODIURIL) 25 MG tablet [id number] Yes Take 1 tablet (25 mg total) by mouth daily. Jerrol Banana., MD Unknown Unknown Active Pharmacy Records  LINZESS 290 MCG CAPS capsule [id number] Yes TAKE 1 CAPSULE BY MOUTH DAILY BEFORE BREAKFAST. Jerrol Banana., MD Unknown Unknown Active Pharmacy Records  magic  mouthwash (lidocaine, diphenhydrAMINE, alum & mag hydroxide) suspension [id number] No Swish and swallow 5 mLs 4 (four) times daily as needed for mouth pain.  Patient not taking: No sig reported   Gwyneth Sprout, FNP Not Taking Active Other  Melatonin 10 MG CAPS [id number] Yes Take 10 mg by mouth at bedtime as needed. [provider] Unknown PRN Active Other  metFORMIN (GLUCOPHAGE) 500 MG tablet [id number] Yes TAKE 1 TABLET (500 MG TOTAL) BY MOUTH 2 (TWO) TIMES DAILY WITH A MEAL. Jerrol Banana., MD Unknown Unknown Active Pharmacy Records           Med Note Michaelle Birks, Cathe Mons A   Mon Feb 27, 2021  1:30 PM)    metoprolol succinate (TOPROL-XL) 25 MG 24 hr tablet [id number] Yes Take 1 tablet (25 mg total) by mouth daily. Jerrol Banana., MD Unknown Unknown Active Pharmacy Records  ondansetron [facility]) 4 MG tablet [id number] Yes Take 1 tablet (4 mg total) by mouth every 8 (eight) hours as needed. Jules Husbands, MD Unknown PRN Active Pharmacy Records  pantoprazole (PROTONIX) 40 MG tablet [id number] No Take 1 tablet (40 mg total) by mouth daily.  Patient not taking: No  sig reported   Versie Starks, PA-C Not Taking Active Other  polyethylene glycol powder (GLYCOLAX/MIRALAX) 17 GM/SCOOP powder [id number] Yes Take 17 g by mouth daily as needed for moderate constipation. Harvest Dark, MD Unknown PRN Active Other  potassium chloride (KLOR-CON) 10 MEQ tablet [id number] No Take 1 tablet (10 mEq total) by mouth daily.  Patient not taking: No sig reported   Johnn Hai, PA-C Not Taking Active Other  rivastigmine (EXELON) 3 MG capsule [id number] Yes Take 3 mg by mouth 2 (two) times daily. [provider] Unknown Unknown Active Pharmacy Records  traMADol (ULTRAM) 50 MG tablet [id number] No TAKE 1 TABLET BY MOUTH EVERY 6 HOURS AS NEEDED.  Patient not taking: No sig reported   Jerrol Banana., MD Not Taking Active Pharmacy Records  traMADol Veatrice [facility]bon) 50 MG tablet [id number] Yes Take 1 tablet (50 mg total) by mouth every 6 (six) hours as needed. Jerrol Banana., MD Unknown PRN Active Pharmacy Records  traZODone (DESYREL) 50 MG tablet [id number] Yes TAKE 1 TABLET (50 MG TOTAL) BY MOUTH AT BEDTIME AS NEEDED. Jerrol Banana., MD Unknown PRN Active Pharmacy Records  triamcinolone ointment (KENALOG) 0.5 % [id number] No Apply 1 application topically 2 (two) times daily.  Patient not taking: No sig reported   Gwyneth Sprout, FNP Not Taking Active Pharmacy Records  Vaginal Lubricant (REPLENS) GEL [id number]  Place 1 Applicatorful vaginally 3 (three) times a week. Gae Dry, MD  Active Other            Patient Active Problem List   Diagnosis Date Noted   TIA (transient ischemic attack) 09/05/2021   Stomatitis and mucositis 07/21/2021   Cheilitis 07/21/2021   Callus of foot 01/19/2021   Pes planus 01/19/2021   PTTD (posterior tibial tendon dysfunction) 01/19/2021   Acute flank pain 12/15/2020   Cystitis 12/15/2020   Urinary symptom or sign 12/15/2020   Low back pain radiating to right lower extremity 06/23/2020    Gastroesophageal reflux disease 04/28/2020   Large bowel obstruction ([place]) 04/17/2019   Acquired trigger finger 03/20/2019   Iron deficiency anemia 11/21/2018   Syncope 03/18/2018   Anemia 03/18/2018   Exophthalmos 02/22/2018   Multiple thyroid nodules 02/22/2018   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   Pain  at surgical site 08/30/2016   Ventral incisional hernia 08/21/2016   SBO (small bowel obstruction) (HCC)    Partial small bowel obstruction ([place]) 04/23/2016   Adjustment disorder with mixed anxiety and depressed mood 04/23/2016   Memory difficulties 09/01/2015   Arthritis 06/11/2015   Back pain, chronic 06/11/2015   HLD (hyperlipidemia) 06/11/2015   Cannot sleep 06/11/2015   L-S radiculopathy 06/11/2015   Arthritis of knee, degenerative 06/11/2015   B12 deficiency 06/11/2015   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   DM2 (diabetes mellitus, type 2) ([place]) 11/15/2012   Adiposity 11/21/2009   Avitaminosis D 11/07/2009   Narrowing of intervertebral disc space 07/25/2009   Benign essential HTN 07/25/2009    Immunization History  Administered Date(s) Administered   Fluad Quad(high Dose 65+) 08/15/2019, 09/01/2020, 09/13/2021   Influenza Split 10/17/2010, 10/02/2011, 08/27/2012   Influenza, High Dose Seasonal PF 08/29/2018   Influenza,inj,Quad PF,6+ Mos 09/26/2013, 09/06/2014, 09/06/2016   Influenza,inj,Quad PF,6-35 Mos 08/31/2017   Influenza-Unspecified 09/15/2015   PFIZER(Purple Top)SARS-COV-2 Vaccination 01/29/2020, 02/23/2020, 10/11/2020   Pneumococcal Conjugate-13 08/29/2018   Pneumococcal Polysaccharide-23 11/17/2012   Tdap 10/02/2011, 09/27/2016   Zoster Recombinat (Shingrix) 08/29/2018, 08/15/2019   Zoster, Live 09/06/2016    Conditions to be addressed/monitored:  Hypertension, Hyperlipidemia, Diabetes, GERD and Chronic Pain  There are no care plans that you recently modified to display for this  patient.     Medication Assistance: None required.  Patient affirms current coverage meets needs.  Patient's [facility]  [id number] [address] [place]Phone: [phone]: [phone] Uses pill box? Yes Pt endorses 100% compliance  We discussed: Current pharmacy is preferred with insurance plan and patient is satisfied with pharmacy services Patient decided to: Continue current medication management strategy  Care Plan and Follow Up Patient Decision:  Patient agrees to Care Plan and Follow-up.  Plan: Telephone follow up appointment with care management team member scheduled for:  06/16/2021 at 11:00 AM  [phone] Current Barriers:  Unable to achieve control of cholesterol  Suboptimal therapeutic regimen for hypertension  Pharmacist Clinical Goal(s):  patient will achieve control of cholesterol as evidenced by LDL less than 70 maintain control of blood pressure as evidenced by BP less than 140/90 and improvement of dizziness symptoms  through collaboration with PharmD and provider.   Interventions: 1:1 collaboration with GJerrol Banana, MD regarding development and update of comprehensive plan of care as evidenced by provider attestation and co-signature Inter-disciplinary care team collaboration (see longitudinal plan of care) Comprehensive medication review performed; medication list updated in electronic medical record  Hypertension (BP goal <140/90) -Not ideally controlled -Current treatment: HCTZ 25 mg daily  Metoprolol XL 25 mg daily  -Medications previously tried: NA  -Current home readings: Systolic [phone] -Current dietary habits: Patient is not following any specific dietary regimen -Current exercise habits: Patient is not following any specific exerciseregimen -Reports hypotensive symptoms -Patient  reports frequent dizziness, especially when bending over.  -Continue current medications  Hyperlipidemia: (LDL goal < 70) -History of MI 1999 -History of TIA Sep 2022 -Uncontrolled -Current treatment: Atorvastatin 40 mg daily -Medications previously tried: NA  -Continue current medications   Diabetes (A1c goal <7%) -Controlled -Current medications: Metformin 500 mg daily  -Medications previously tried: NA  -Current home glucose readings fasting glucose: NA post prandial glucose: NA -Denies hypoglycemic/hyperglycemic symptoms -Educated on  Benefits of routine self-monitoring of blood sugar; Carbohydrate counting and/or plate method -Continue current medications  Dementia (Goal: Prevent further decline in memory) -Managed by Dr. [name] -Uncontrolled -Current treatment  Rivastigmine 3 mg twice daily  -Medications previously tried: NA -Neurology recommended folic acid 38m daily, was not started.  -Recommended starting folic acid 4 mg daily  -Continue current medications  Patient Goals/Self-Care Activities patient will:  - check blood pressure 2-3 times weekly, document, and provide at future appointments target a minimum of 150 minutes of moderate intensity exercise weekly  Follow Up Plan: ***

## 2021-09-19 NOTE — Telephone Encounter (Signed)
Copied from [place] [id number]. Topic: General - Other >> Sep 19, 2021  3:47 PM Leward Quan A wrote: Reason for CRM: Farrel Demark with [facility] called in to inform Dr Rosanna Randy that patient cancelled her visits today say that she  will be traveling to [place] to stay with her daughter and does not know when she will return so [facility] will have a non visit discharge and upon patients return if she still need services she can be reinstated. Any questions or concerns please call Farrel Demark at Ph#  [phone]

## 2021-09-19 NOTE — Progress Notes (Signed)
    Chronic Care Management Pharmacy Assistant   Name: Julie Acevedo  MRN: [id number] DOB: 11-29-1951  [name], CPP sent me a message requesting I reach out to [facility] regarding the patient's Atorvastatin. He requested that I cal them to see how soon they can have this refilled for the patient.  [facility] and the representative informed me that patient had already picked this medication up on 09/09/2021 and it was for a 90-Day supply. [name], CPP notified of this information.    Medications: Outpatient Encounter Medications as of 09/19/2021  Medication Sig   aspirin EC 81 MG EC tablet Take 1 tablet (81 mg total) by mouth daily. Swallow whole.   atorvastatin (LIPITOR) 40 MG tablet Take 1 tablet (40 mg total) by mouth daily.   calcitRIOL (ROCALTROL) 0.25 MCG capsule Take 0.25 mcg by mouth daily.   cyclobenzaprine (FLEXERIL) 5 MG tablet TAKE 1 TABLET BY MOUTH THREE TIMES A DAY AS NEEDED   gabapentin (NEURONTIN) 100 MG capsule Take 2 capsules (200 mg total) by mouth 2 (two) times daily.   hydrochlorothiazide (HYDRODIURIL) 25 MG tablet Take 1 tablet (25 mg total) by mouth daily.   LINZESS 290 MCG CAPS capsule TAKE 1 CAPSULE BY MOUTH DAILY BEFORE BREAKFAST.   magic mouthwash (lidocaine, diphenhydrAMINE, alum & mag hydroxide) suspension Swish and swallow 5 mLs 4 (four) times daily as needed for mouth pain. (Patient not taking: No sig reported)   Melatonin 10 MG CAPS Take 10 mg by mouth at bedtime as needed.   metFORMIN (GLUCOPHAGE) 500 MG tablet TAKE 1 TABLET (500 MG TOTAL) BY MOUTH 2 (TWO) TIMES DAILY WITH A MEAL.   metoprolol succinate (TOPROL-XL) 25 MG 24 hr tablet Take 1 tablet (25 mg total) by mouth daily.   ondansetron (ZOFRAN) 4 MG tablet Take 1 tablet (4 mg total) by mouth every 8 (eight) hours as needed.   polyethylene glycol powder (GLYCOLAX/MIRALAX) 17 GM/SCOOP powder Take 17 g by mouth daily as needed for moderate constipation.   rivastigmine (EXELON) 3 MG capsule  Take 3 mg by mouth 2 (two) times daily.   traMADol (ULTRAM) 50 MG tablet Take 1 tablet (50 mg total) by mouth every 6 (six) hours as needed.   traZODone (DESYREL) 50 MG tablet TAKE 1 TABLET (50 MG TOTAL) BY MOUTH AT BEDTIME AS NEEDED.   triamcinolone ointment (KENALOG) 0.5 % APPLY TO AFFECTED AREA TWICE A DAY   Vaginal Lubricant (REPLENS) GEL Place 1 Applicatorful vaginally 3 (three) times a week.   No facility-administered encounter medications on file as of 09/19/2021.    Lynann Bologna, CPA/CMA Clinical Pharmacist Assistant Phone: [phone]

## 2021-09-19 NOTE — Telephone Encounter (Signed)
Requested medication (s) are due for refill today:   Requested medication (s) are on the active medication list: No  Last refill:  11/18/20  Future visit scheduled: No  Notes to clinic:  Looks like medication has been d/c 06/20/21.    Requested Prescriptions  Pending Prescriptions Disp Refills   omeprazole (PRILOSEC) 20 MG capsule [Pharmacy Med Name: OMEPRAZOLE DR 20 MG CAPSULE] 90 capsule 1    Sig: TAKE 1 CAPSULE BY MOUTH EVERY DAY     Gastroenterology: Proton Pump Inhibitors Passed - 09/18/2021  9:48 PM      Passed - Valid encounter within last 12 months    Recent Outpatient Visits           6 days ago Syncope, unspecified syncope type   [facility] Jerrol Banana., MD   3 weeks ago Type 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, without long-term current use of insulin [facility])   [facility] Jerrol Banana., MD   2 months ago Stomatitis and mucositis   [facility] Tally Joe T, FNP   4 months ago Seasonal allergic rhinitis due to pollen   [facility] Rosanna Randy, Retia Passe., MD   6 months ago Diabetes mellitus due to underlying condition with diabetic nephropathy, without long-term current use of insulin [facility])   [facility] Jerrol Banana., MD       Future Appointments             In 4 months Jerrol Banana., MD [facility], [place]   In 4 months Jerrol Banana., MD [facility]

## 2021-09-19 NOTE — Progress Notes (Signed)
Chronic Care Management Pharmacy Note  09/19/2021 Name:  Julie Acevedo MRN:  [id number] DOB:  21-May-1951  Summary: Patient presents for CCM follow-up. She is concerned about her recent stroke and dementia. She though she was taking atorvastatin, but upon review never filled it at the pharmacy. She reports feeling overwhelmed at times with her medications, but is uninterested in switching pharmacies.   Recommendations/Changes made from today's visit: [facility] on behalf of patient.  Continue current medications  Plan: CPP follow-up 3 months  Subjective: Julie Acevedo is an 70 y.o. year old female who is a primary patient of Jerrol Banana., MD.  The CCM team was consulted for assistance with disease management and care coordination needs.    Engaged with patient by telephone for follow up visit in response to provider referral for pharmacy case management and/or care coordination services.   Consent to Services:  The patient was given information about Chronic Care Management services, agreed to services, and gave verbal consent prior to initiation of services.  Please see initial visit note for detailed documentation.   Patient Care Team: Jerrol Banana., MD as PCP - General (Family Medicine) Yolonda Kida, MD as PCP - Cardiology (Cardiology) Lorelee Cover., MD as Consulting Physician (Ophthalmology) Bjorn Loser, MD as Consulting Physician (Urology) Bary Castilla, Forest Gleason, MD as Consulting Physician (General Surgery) Jules Husbands, MD as Consulting Physician (General Surgery) Gardiner Barefoot, DPM as Consulting Physician (Podiatry) Gae Dry, MD as Referring Physician (Obstetrics and Gynecology) Vladimir Crofts, MD as Consulting Physician (Neurology) Randon Goldsmith, OT/L as Occupational Therapist (Occupational Therapy)  Recent office visits: 09/13/21: Patient presented to Dr. Rosanna Randy for hospital follow-up.  07/21/2021  Tally Joe FNP (PCP Office Visit) for Stomatitis and mucositis- started on magic mouthwash, and Triamcinolone Acetonide 0.5%; Discontinued: Amoxicilliam 531m, Diphenhydramine HCl 50 mg, Lidocaine HCl 2%, Prasterone 6.5 all due to patient not taking. 04/25/21: Patient presented to Dr. GRosanna Randyfor allergic rhinitis. No medication changes noted.   Recent consult visits:  09/14/21: [facility] [place]([facility] for follow-up.   07/06/2021 MLyla Son MD (Nephrology)    Hospital visits: 9/20-9/23/22: Patient hospitalized for TIA   Admitted to the hospital on 06/19/2021 due to Esophagitis. Discharge date was 06/20/2021. [facility] -started  amoxicillin 500 MG Take 1 capsule (500 mg total) by mouth 3 (three) times daily. capsule  lidocaine 2 % solution Use as directed 15 mLs in the mouth or throat as needed for mouth pain. pantoprazole 40 MG tablet Take 1 tablet (40 mg total) by mouth daily.   Medication Changes at Hospital Discharge: -Changed None ID   Medications Discontinued at Hospital Discharge: -Stopped Omeprazole 20 mg  due to Change in therapy   Medications that remain the same after Hospital Discharge:??  -All other medications will remain the same.    Objective:  Lab Results  Component Value Date   CREATININE 1.42 (H) 09/13/2021   BUN 13 09/13/2021   GFRNONAA 52 (L) 09/07/2021   GFRAA 42 (L) 12/05/2020   NA 141 09/13/2021   K 4.4 09/13/2021   CALCIUM 8.9 09/13/2021   CO2 26 09/13/2021    Lab Results  Component Value Date/Time   HGBA1C 5.6 09/06/2021 04:43 AM   HGBA1C 5.3 08/29/2021 09:03 AM   HGBA1C 5.5 03/06/2021 04:26 PM   HGBA1C 6.3  01/18/2014 06:32 AM   MICROALBUR 20 06/23/2020 02:10 PM   MICROALBUR 50 11/18/2017 09:33 AM    Last diabetic Eye exam:  Lab Results  Component Value Date/Time   HMDIABEYEEXA No  Retinopathy 12/03/2019 12:00 AM    Last diabetic Foot exam: No results found for: HMDIABFOOTEX   Lab Results  Component Value Date   CHOL 134 09/06/2021   HDL 60 09/06/2021   LDLCALC 63 09/06/2021   TRIG 54 09/06/2021   CHOLHDL 2.2 09/06/2021    Hepatic Function Latest Ref Rng & Units 09/13/2021 09/05/2021 06/19/2021  Total Protein 6.5 - 8.1 g/dL - 6.4(L) 6.6  Albumin 3.8 - 4.8 g/dL 3.9 3.3(L) 3.4(L)  AST 15 - 41 U/L - 17 13(L)  ALT 0 - 44 U/L - 7 8  Alk Phosphatase 38 - 126 U/L - 152(H) 168(H)  Total Bilirubin 0.3 - 1.2 mg/dL - 0.6 0.4    Lab Results  Component Value Date/Time   TSH 1.896 06/28/2021 01:23 PM   TSH 3.730 12/05/2020 08:55 AM   TSH 2.170 08/18/2020 11:36 AM   FREET4 0.91 06/28/2021 01:23 PM    CBC Latest Ref Rng & Units 09/13/2021 09/07/2021 09/05/2021  WBC 3.4 - 10.8 x10E3/uL 8.6 6.6 5.7  Hemoglobin 11.1 - 15.9 g/dL 11.3 11.9(L) 11.1(L)  Hematocrit 34.0 - 46.6 % 33.5(L) 37.0 33.7(L)  Platelets 150 - 450 x10E3/uL 262 222 237    Lab Results  Component Value Date/Time   VD25OH 8.7 (L) 06/16/2019 03:51 PM    Clinical ASCVD: Yes  The ASCVD Risk score (Arnett DK, et al., 2019) failed to calculate for the following reasons:   The patient has a prior MI or stroke diagnosis    Depression screen [facility] 2/9 06/01/2021 05/29/2021 04/26/2021  Decreased Interest 0 0 0  Down, Depressed, Hopeless _0 PHQ - 2 Score _1 Altered sleeping - 2 -  Tired, decreased energy - 3 -  Change in appetite - 3 -  Feeling bad or failure about yourself  - 2 -  Trouble concentrating - 2 -  Moving slowly or fidgety/restless - 2 -  Suicidal thoughts - 0 -  PHQ-9 Score - 16 -  Difficult doing work/chores - Somewhat difficult -  Some recent data might be hidden    Social History   Tobacco Use  Smoking Status Never  Smokeless Tobacco Never   BP Readings from Last 3 Encounters:  09/13/21 120/60  09/08/21 (!) 102/57  09/05/21 129/66   Pulse Readings from Last 3 Encounters:   09/13/21 65  09/08/21 82  09/05/21 84   Wt Readings from Last 3 Encounters:  09/13/21 120 lb (54.4 kg)  09/06/21 114 lb 13.8 oz (52.1 kg)  09/05/21 127 lb (57.6 kg)    Assessment/Interventions: Review of patient past medical history, allergies, medications, health status, including review of consultants reports, laboratory and other test data, was performed as part of comprehensive evaluation and provision of chronic care management services.   SDOH:  (Social Determinants of Health) assessments and interventions performed: Yes SDOH Interventions    Flowsheet Row Most Recent Value  SDOH Interventions   Financial Strain Interventions Intervention Not Indicated        CCM Care Plan  Allergies  Allergen Reactions   Lotrel [Amlodipine Besy-Benazepril Hcl] Anaphylaxis   Contrast Media [Iodinated Diagnostic Agents] Swelling and Other (See Comments)    Reaction:  Neck swelling    Niacin Hives   Sulfa Antibiotics Hives  Medications Reviewed Today     Reviewed by Francene Finders, CPhT (Pharmacy Technician) on 09/05/21 at 1452  Med List Status: Complete   Medication Order Taking? Sig Documenting Provider Last Dose Status Informant  calcitRIOL (ROCALTROL) 0.25 MCG capsule [id number] Yes Take 0.25 mcg by mouth daily. [provider] Unknown Unknown Active Pharmacy Records  cyclobenzaprine (FLEXERIL) 5 MG tablet [id number] Yes TAKE 1 TABLET BY MOUTH THREE TIMES A DAY AS NEEDED Jerrol Banana., MD Unknown PRN Active Pharmacy Records  gabapentin (NEURONTIN) 100 MG capsule [id number] Yes TAKE 2 CAPSULES (200 MG TOTAL) BY MOUTH 3 (THREE) TIMES DAILY.  Patient taking differently: Take 200 mg by mouth 2 (two) times daily.   Jerrol Banana., MD Unknown Unknown Active Pharmacy Records           Med Note Michaelle Birks, Cathe Mons A   Mon Feb 27, 2021  1:31 PM)    hydrochlorothiazide (HYDRODIURIL) 25 MG tablet [id number] Yes Take 1 tablet (25 mg total) by mouth daily. Jerrol Banana., MD Unknown Unknown Active Pharmacy Records  LINZESS 290 MCG CAPS capsule [id number] Yes TAKE 1 CAPSULE BY MOUTH DAILY BEFORE BREAKFAST. Jerrol Banana., MD Unknown Unknown Active Pharmacy Records  magic mouthwash (lidocaine, diphenhydrAMINE, alum & mag hydroxide) suspension [id number] No Swish and swallow 5 mLs 4 (four) times daily as needed for mouth pain.  Patient not taking: No sig reported   Gwyneth Sprout, FNP Not Taking Active Other  Melatonin 10 MG CAPS [id number] Yes Take 10 mg by mouth at bedtime as needed. [provider] Unknown PRN Active Other  metFORMIN (GLUCOPHAGE) 500 MG tablet [id number] Yes TAKE 1 TABLET (500 MG TOTAL) BY MOUTH 2 (TWO) TIMES DAILY WITH A MEAL. Jerrol Banana., MD Unknown Unknown Active Pharmacy Records           Med Note Michaelle Birks, Cathe Mons A   Mon Feb 27, 2021  1:30 PM)    metoprolol succinate (TOPROL-XL) 25 MG 24 hr tablet [id number] Yes Take 1 tablet (25 mg total) by mouth daily. Jerrol Banana., MD Unknown Unknown Active Pharmacy Records  ondansetron [facility]) 4 MG tablet [id number] Yes Take 1 tablet (4 mg total) by mouth every 8 (eight) hours as needed. Jules Husbands, MD Unknown PRN Active Pharmacy Records  pantoprazole (PROTONIX) 40 MG tablet [id number] No Take 1 tablet (40 mg total) by mouth daily.  Patient not taking: No sig reported   Versie Starks, PA-C Not Taking Active Other  polyethylene glycol powder (GLYCOLAX/MIRALAX) 17 GM/SCOOP powder [id number] Yes Take 17 g by mouth daily as needed for moderate constipation. Harvest Dark, MD Unknown PRN Active Other  potassium chloride (KLOR-CON) 10 MEQ tablet [id number] No Take 1 tablet (10 mEq total) by mouth daily.  Patient not taking: No sig reported   Johnn Hai, PA-C Not Taking Active Other  rivastigmine (EXELON) 3 MG capsule [id number] Yes Take 3 mg by mouth 2 (two) times daily. [provider] Unknown Unknown Active Pharmacy Records  traMADol  (ULTRAM) 50 MG tablet [id number] No TAKE 1 TABLET BY MOUTH EVERY 6 HOURS AS NEEDED.  Patient not taking: No sig reported   Jerrol Banana., MD Not Taking Active Pharmacy Records  traMADol Veatrice Bourbon) 50 MG tablet [id number] Yes Take 1 tablet (50 mg total) by mouth every 6 (six) hours as needed. Jerrol Banana., MD Unknown PRN Active Pharmacy Records  traZODone (DESYREL) 50 MG tablet [id number] Yes TAKE 1 TABLET (  50 MG TOTAL) BY MOUTH AT BEDTIME AS NEEDED. Jerrol Banana., MD Unknown PRN Active Pharmacy Records  triamcinolone ointment (KENALOG) 0.5 % [id number] No Apply 1 application topically 2 (two) times daily.  Patient not taking: No sig reported   Gwyneth Sprout, FNP Not Taking Active Pharmacy Records  Vaginal Lubricant (REPLENS) GEL [id number]  Place 1 Applicatorful vaginally 3 (three) times a week. Gae Dry, MD  Active Other            Patient Active Problem List   Diagnosis Date Noted   TIA (transient ischemic attack) 09/05/2021   Stomatitis and mucositis 07/21/2021   Cheilitis 07/21/2021   Callus of foot 01/19/2021   Pes planus 01/19/2021   PTTD (posterior tibial tendon dysfunction) 01/19/2021   Acute flank pain 12/15/2020   Cystitis 12/15/2020   Urinary symptom or sign 12/15/2020   Low back pain radiating to right lower extremity 06/23/2020   Gastroesophageal reflux disease 04/28/2020   Large bowel obstruction ([place]) 04/17/2019   Acquired trigger finger 03/20/2019   Iron deficiency anemia 11/21/2018   Syncope 03/18/2018   Anemia 03/18/2018   Exophthalmos 02/22/2018   Multiple thyroid nodules 02/22/2018   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   Pain at surgical site 08/30/2016   Ventral incisional hernia 08/21/2016   SBO (small bowel obstruction) (HCC)    Partial small bowel obstruction ([place]) 04/23/2016   Adjustment disorder with mixed anxiety and depressed mood 04/23/2016   Memory difficulties 09/01/2015   Arthritis 06/11/2015   Back pain, chronic  06/11/2015   HLD (hyperlipidemia) 06/11/2015   Cannot sleep 06/11/2015   L-S radiculopathy 06/11/2015   Arthritis of knee, degenerative 06/11/2015   B12 deficiency 06/11/2015   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   DM2 (diabetes mellitus, type 2) ([place]) 11/15/2012   Adiposity 11/21/2009   Avitaminosis D 11/07/2009   Narrowing of intervertebral disc space 07/25/2009   Benign essential HTN 07/25/2009    Immunization History  Administered Date(s) Administered   Fluad Quad(high Dose 65+) 08/15/2019, 09/01/2020, 09/13/2021   Influenza Split 10/17/2010, 10/02/2011, 08/27/2012   Influenza, High Dose Seasonal PF 08/29/2018   Influenza,inj,Quad PF,6+ Mos 09/26/2013, 09/06/2014, 09/06/2016   Influenza,inj,Quad PF,6-35 Mos 08/31/2017   Influenza-Unspecified 09/15/2015   PFIZER(Purple Top)SARS-COV-2 Vaccination 01/29/2020, 02/23/2020, 10/11/2020   Pneumococcal Conjugate-13 08/29/2018   Pneumococcal Polysaccharide-23 11/17/2012   Tdap 10/02/2011, 09/27/2016   Zoster Recombinat (Shingrix) 08/29/2018, 08/15/2019   Zoster, Live 09/06/2016    Conditions to be addressed/monitored:  Hypertension, Hyperlipidemia, Diabetes, GERD and Chronic Pain  Care Plan : General Pharmacy (Adult)  Updates made by Germaine Pomfret, RPH since 09/19/2021 12:00 AM     Problem: Hypertension, Hyperlipidemia, Diabetes, GERD and Chronic Pain   Priority: High     Long-Range Goal: Patient-Specific Goal   Start Date: 02/27/2021  Expected End Date: 09/19/2022  This Visit's Progress: On track  Recent Progress: On track  Priority: High  Note:   Current Barriers:  Unable to achieve control of cholesterol  Suboptimal therapeutic regimen for hypertension  Pharmacist Clinical Goal(s):  patient will achieve control of cholesterol as evidenced by LDL less than 70 maintain control of blood pressure as evidenced by BP less than 140/90 and improvement of  dizziness symptoms  through collaboration with PharmD and provider.   Interventions: 1:1 collaboration with Jerrol Banana., MD regarding development and update of comprehensive plan of care as evidenced by provider attestation and co-signature Inter-disciplinary care team  collaboration (see longitudinal plan of care) Comprehensive medication review performed; medication list updated in electronic medical record  Hypertension (BP goal <140/90) -Not ideally controlled -Current treatment: HCTZ 25 mg daily  Metoprolol XL 25 mg daily  -Medications previously tried: NA  -Current home readings: Systolic [id number]  -Current dietary habits: Patient is not following any specific dietary regimen -Current exercise habits: Patient is not following any specific exerciseregimen -Reports hypotensive symptoms -Patient reports frequent dizziness, especially when bending over.  -Continue current medications  Hyperlipidemia: (LDL goal < 70) -History of MI 1999 -History of TIA Sep 2022 -Uncontrolled -Current treatment: Atorvastatin 40 mg daily -Medications previously tried: NA  -Continue current medications   Diabetes (A1c goal <7%) -Controlled -Current medications: Metformin 500 mg daily  -Medications previously tried: NA  -Current home glucose readings fasting glucose: NA post prandial glucose: NA -Denies hypoglycemic/hyperglycemic symptoms -Educated on Benefits of routine self-monitoring of blood sugar; Carbohydrate counting and/or plate method -Continue current medications  Dementia (Goal: Prevent further decline in memory) -Managed by Dr. [name] -Uncontrolled -Current treatment  Rivastigmine 3 mg twice daily  -Medications previously tried: NA -Neurology recommended folic acid 11m daily, was not started.  -Recommended starting folic acid 4 mg daily  -Continue current medications  Patient Goals/Self-Care Activities patient will:  - check blood pressure 2-3 times weekly,  document, and provide at future appointments target a minimum of 150 minutes of moderate intensity exercise weekly  Follow Up Plan: Telephone follow up appointment with care management team member scheduled for:  12/26/2021 at 8:30 AM        Medication Assistance: None required.  Patient affirms current coverage meets needs.  Compliance/Adherence/Medication fill history: Care Gaps: Diabetic Eye Exam Urine Microalbumin COVID-19 Vaccine Booster 5  Star-Rating Drugs: Atorvastatin 40 mg No fill history Metformin 500 mg last filled on 07/16/2021 [facility]Day [facility]  [facility]  [id number] [place] [place]one: [phone]: [phone] pill box? Yes Pt endorses 100% compliance  We discussed: Current pharmacy is preferred with insurance plan and patient is satisfied with pharmacy services Patient decided to: Continue current medication management strategy  Care Plan and Follow Up Patient Decision:  Patient agrees to Care Plan and Follow-up.  Plan: Telephone follow up appointment with care management team member scheduled for:  12/26/2021 at 8:30 AM  [phone]

## 2021-09-19 NOTE — Telephone Encounter (Signed)
FYI

## 2021-09-20 ENCOUNTER — Other Ambulatory Visit: Payer: Self-pay | Admitting: Occupational Therapy

## 2021-09-20 ENCOUNTER — Other Ambulatory Visit: Payer: Self-pay

## 2021-09-20 NOTE — Patient Instructions (Signed)
Goals Addressed             This Visit's Progress    Patient Stated       She would like to feel safer in bathroom (grab bars, hand held shower, tub seat all would help this). Ms. Huebsch does not feel that she needs a tub seat--she said that if she needs to start taking a shower, instead of a tub bath, she will use her husbands walk in shower and tub bench.     COMPLETED: Patient Stated       She would like it to be easier to get in and out of the bed (a bed rail will help with this)--Ms. Hedeen feels she does not need this at this point.

## 2021-09-20 NOTE — Patient Outreach (Signed)
Aging Gracefully Program  OT Follow-Up Visit  09/20/2021  Julie Acevedo [birth year] [id number]  Visit:  3- Third Visit  Start Time:  1191 End Time:  4782 Total Minutes:  20  Readiness to Change Score :  Readiness to Change Score: 9.33   Goals:   Goals Addressed             This Visit's Progress    Patient Stated       She would like to feel safer in bathroom (grab bars, hand held shower, tub seat all would help this). [name] does not feel that she needs a tub seat--she said that if she needs to start taking a shower, instead of a tub bath, she will use her husbands walk in shower and tub bench.     COMPLETED: Patient Stated       She would like it to be easier to get in and out of the bed (a bed rail will help with this)--[name] feels she does not need this at this point.        Post Clinical Reasoning: Clinician View Of Client Situation:: [name] had a stroke a couple of weeks ago. In speaking to to her on the phone she reported that she was a little weaker on her right side (more leg than arm) and was receiving home health physical therapy. In seeing her today she reports she is back to her baseline and appears to be so in observing her. She reports she is no longer receiving home health physical therapy because she did not feel she needed it anymore. She was able with minimal assistance to get up from the floor using a chair to help her. Client View Of His/Her Situation:: She feels she is back to her baseline from her stroke.She looks forward to getting the grab bars in her tub and the deck/ramp at garage, Next Visit Plan:: Waiting on home modifications  [address], OTR/L Aging Gracefully [phone]

## 2021-09-22 ENCOUNTER — Other Ambulatory Visit: Payer: Self-pay | Admitting: Family Medicine

## 2021-09-22 ENCOUNTER — Ambulatory Visit: Payer: Self-pay | Admitting: *Deleted

## 2021-09-22 MED ORDER — ONDANSETRON HCL 4 MG PO TABS: 4.0000 mg | ORAL_TABLET | Freq: Three times a day (TID) | ORAL | 0 refills | Status: DC | PRN

## 2021-09-22 NOTE — Telephone Encounter (Signed)
Pt called in to  request a refill on her ondansetron (ZOFRAN) 4 MG tablet . Pt says that she would like to have filled by PCP instead. Pt says that she has been experiencing nausea for a while and has discussed this with her provider. Pt was advised by pharmacy to call office for assistance.    [id number] [address], [place] Phone:  [phone]  Fax:  [phone]

## 2021-09-22 NOTE — Addendum Note (Signed)
Addended by: Ashley Royalty E on: 09/22/2021 01:17 PM   Modules accepted: Orders

## 2021-09-22 NOTE — Telephone Encounter (Signed)
Patient called, left VM to return the call to the office to speak to a nurse about the refill requested.

## 2021-09-22 NOTE — Telephone Encounter (Signed)
Pt returning call.   She is needing a refill on her Zofran.  She was supposed to have a procedure done to see what is wrong with my stomach and I had a stroke while they were prepping me.   So the procedure wasn't done.  They couldn't do the procedure so she's requesting Dr. Rosanna Randy to refill the Zofran instead of the GI dr.  The GI dr said he could not continue to fill the medication since she is not seeing him presently since the procedure wasn't able to be done.    Dr. Rosanna Randy is aware of the problems I'm having with my stomach and the nausea.  I'm still having the nausea when I eat.  I let her know I would get her message to Dr. Rosanna Randy on refilling this for her.   She thanked me for my help.  I sent this request to [facility] for Dr. Rosanna Randy.

## 2021-09-26 ENCOUNTER — Other Ambulatory Visit: Payer: Self-pay | Admitting: Family Medicine

## 2021-09-26 ENCOUNTER — Other Ambulatory Visit: Payer: Self-pay

## 2021-09-26 ENCOUNTER — Ambulatory Visit (INDEPENDENT_AMBULATORY_CARE_PROVIDER_SITE_OTHER): Payer: Medicare HMO | Admitting: Podiatry

## 2021-09-26 ENCOUNTER — Ambulatory Visit (INDEPENDENT_AMBULATORY_CARE_PROVIDER_SITE_OTHER): Payer: Medicare HMO

## 2021-09-26 DIAGNOSIS — M216X2 Other acquired deformities of left foot: Secondary | ICD-10-CM

## 2021-09-26 DIAGNOSIS — Q828 Other specified congenital malformations of skin: Secondary | ICD-10-CM

## 2021-09-26 DIAGNOSIS — Z01818 Encounter for other preprocedural examination: Secondary | ICD-10-CM | POA: Diagnosis not present

## 2021-09-27 NOTE — Telephone Encounter (Signed)
Requested Prescriptions  Pending Prescriptions Disp Refills  . LINZESS 290 MCG CAPS capsule [Pharmacy Med Name: LINZESS 290 MCG CAPSULE] 90 capsule 0    Sig: TAKE 1 CAPSULE BY MOUTH EVERY DAY BEFORE BREAKFAST     Gastroenterology: Irritable Bowel Syndrome Passed - 09/26/2021  4:03 PM      Passed - Valid encounter within last 12 months    Recent Outpatient Visits          2 weeks ago Syncope, unspecified syncope type   [facility] Jerrol Banana., MD   4 weeks ago Type 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, without long-term current use of insulin [facility])   [facility] Jerrol Banana., MD   2 months ago Stomatitis and mucositis   [facility] Tally Joe T, FNP   5 months ago Seasonal allergic rhinitis due to pollen   [facility] Rosanna Randy, Retia Passe., MD   6 months ago Diabetes mellitus due to underlying condition with diabetic nephropathy, without long-term current use of insulin [facility])   [facility] Jerrol Banana., MD      Future Appointments            In 4 months Jerrol Banana., MD [facility] [place]   In 4 months Jerrol Banana., MD [facility] [place]

## 2021-09-28 DIAGNOSIS — E785 Hyperlipidemia, unspecified: Secondary | ICD-10-CM | POA: Diagnosis not present

## 2021-09-28 DIAGNOSIS — E119 Type 2 diabetes mellitus without complications: Secondary | ICD-10-CM | POA: Diagnosis not present

## 2021-09-28 DIAGNOSIS — R82998 Other abnormal findings in urine: Secondary | ICD-10-CM | POA: Diagnosis not present

## 2021-09-28 DIAGNOSIS — N1832 Chronic kidney disease, stage 3b: Secondary | ICD-10-CM | POA: Diagnosis not present

## 2021-09-28 DIAGNOSIS — R809 Proteinuria, unspecified: Secondary | ICD-10-CM | POA: Diagnosis not present

## 2021-09-29 ENCOUNTER — Encounter: Payer: Self-pay | Admitting: Podiatry

## 2021-09-29 NOTE — Progress Notes (Signed)
Subjective:  Patient ID: Julie Acevedo, female    DOB: 1951/05/28,  MRN: [id number]  Chief Complaint  Patient presents with   Callouses    Left foot     70 y.o. female presents with the above complaint.  Patient presents with follow left submetatarsal 1 and fifth benign skin lesion/porokeratosis.  She states that it has come back and still painful to touch.  The injection helped a little bit.  She would like to discuss surgical options as she has failed all conservative treatment options and she does not want to limp foot.   Review of Systems: Negative except as noted in the HPI. Denies N/V/F/Ch.  Past Medical History:  Diagnosis Date   Anemia    Arthritis    COVID-19 11/19/2019   Diverticulitis    DM (diabetes mellitus) ([place])    typ e 2   History of methicillin resistant staphylococcus aureus (MRSA) 2017   HTN (hypertension)    Hyperlipidemia    Memory difficulties 09/01/2015   Multiple thyroid nodules    Myocardial infarction ([place]) 1995   Short-term memory loss    Thyroid nodule     Current Outpatient Medications:    aspirin EC 81 MG EC tablet, Take 1 tablet (81 mg total) by mouth daily. Swallow whole., Disp: 30 tablet, Rfl: 11   atorvastatin (LIPITOR) 40 MG tablet, Take 1 tablet (40 mg total) by mouth daily., Disp: 30 tablet, Rfl: 2   calcitRIOL (ROCALTROL) 0.25 MCG capsule, Take 0.25 mcg by mouth daily., Disp: , Rfl:    cyclobenzaprine (FLEXERIL) 5 MG tablet, TAKE 1 TABLET BY MOUTH THREE TIMES A DAY AS NEEDED, Disp: 60 tablet, Rfl: 3   gabapentin (NEURONTIN) 100 MG capsule, Take 2 capsules (200 mg total) by mouth 2 (two) times daily., Disp: 120 capsule, Rfl: 1   hydrochlorothiazide (HYDRODIURIL) 25 MG tablet, Take 1 tablet (25 mg total) by mouth daily., Disp: 90 tablet, Rfl: 1   LINZESS 290 MCG CAPS capsule, TAKE 1 CAPSULE BY MOUTH EVERY DAY BEFORE BREAKFAST, Disp: 90 capsule, Rfl: 0   magic mouthwash (lidocaine, diphenhydrAMINE, alum & mag hydroxide) suspension, Swish and  swallow 5 mLs 4 (four) times daily as needed for mouth pain. (Patient not taking: No sig reported), Disp: 360 mL, Rfl: 0   Melatonin 10 MG CAPS, Take 10 mg by mouth at bedtime as needed., Disp: , Rfl:    metFORMIN (GLUCOPHAGE) 500 MG tablet, TAKE 1 TABLET (500 MG TOTAL) BY MOUTH 2 (TWO) TIMES DAILY WITH A MEAL., Disp: 180 tablet, Rfl: 3   metoprolol succinate (TOPROL-XL) 25 MG 24 hr tablet, Take 1 tablet (25 mg total) by mouth daily., Disp: 90 tablet, Rfl: 1   ondansetron (ZOFRAN) 4 MG tablet, Take 1 tablet (4 mg total) by mouth every 8 (eight) hours as needed., Disp: 20 tablet, Rfl: 0   polyethylene glycol powder (GLYCOLAX/MIRALAX) 17 GM/SCOOP powder, Take 17 g by mouth daily as needed for moderate constipation., Disp: 255 g, Rfl: 0   rivastigmine (EXELON) 3 MG capsule, Take 3 mg by mouth 2 (two) times daily., Disp: , Rfl:    traMADol (ULTRAM) 50 MG tablet, Take 1 tablet (50 mg total) by mouth every 6 (six) hours as needed., Disp: 100 tablet, Rfl: 2   traZODone (DESYREL) 50 MG tablet, TAKE 1 TABLET (50 MG TOTAL) BY MOUTH AT BEDTIME AS NEEDED., Disp: 90 tablet, Rfl: 4   triamcinolone ointment (KENALOG) 0.5 %, APPLY TO AFFECTED AREA TWICE A DAY, Disp: 30 g, Rfl:  0   Vaginal Lubricant (REPLENS) GEL, Place 1 Applicatorful vaginally 3 (three) times a week., Disp: 35 g, Rfl: 11  Social History   Tobacco Use  Smoking Status Never  Smokeless Tobacco Never    Allergies  Allergen Reactions   Lotrel [Amlodipine Besy-Benazepril Hcl] Anaphylaxis   Contrast Media [Iodinated Diagnostic Agents] Swelling and Other (See Comments)    Reaction:  Neck swelling    Niacin Hives   Sulfa Antibiotics Hives   Objective:  There were no vitals filed for this visit. There is no height or weight on file to calculate BMI. Constitutional Well developed. Well nourished.  Vascular Dorsalis pedis pulses palpable bilaterally. Posterior tibial pulses palpable bilaterally. Capillary refill normal to all digits.  No  cyanosis or clubbing noted. Pedal hair growth normal.  Neurologic Normal speech. Oriented to person, place, and time. Epicritic sensation to light touch grossly present bilaterally.  Dermatologic  pain on palpation to left first and fifth metatarsophalangeal joint.  Pain with range of motion left first and fifth MPJ.   porokeratosis noted at the first and fifth MPJ as well which includes hyperkeratotic lesion with central nucleated core.  Pain on palpation to the lesion.  Underlying plantarflexed metatarsal noted.  Orthopedic: Normal joint ROM without pain or crepitus bilaterally. No visible deformities. No bony tenderness.   Radiographs: None Assessment:   1. Porokeratosis   2. Plantar flexed metatarsal bone of left foot   3. Preoperative examination     Plan:  Patient was evaluated and treated and all questions answered.  Left submetatarsal 5 porokeratosis with underlying plantarflexed fifth metatarsal -I explained the patient the etiology of porokeratosis emergency room and options were extensively discussed.  I discussed with the patient in extensive detail given the amount of pain that she is having I believe she will benefit from metatarsal osteotomy/floating osteotomy of the fifth metatarsal to help and allow the bone to heal in appropriate position.  I discussed this with the patient in extensive detail she states understanding and would like to proceed with surgery. -Informed surgical risk consent was reviewed and read aloud to the patient.  I reviewed the films.  I have discussed my findings with the patient in great detail.  I have discussed all risks including but not limited to infection, stiffness, scarring, limp, disability, deformity, damage to blood vessels and nerves, numbness, poor healing, need for braces, arthritis, chronic pain, amputation, death.  All benefits and realistic expectations discussed in great detail.  I have made no promises as to the outcome.  I have  provided realistic expectations.  I have offered the patient a 2nd opinion, which they have declined and assured me they preferred to proceed despite the risks    No follow-ups on file.

## 2021-10-05 ENCOUNTER — Other Ambulatory Visit: Payer: Self-pay | Admitting: Family Medicine

## 2021-10-05 DIAGNOSIS — D631 Anemia in chronic kidney disease: Secondary | ICD-10-CM | POA: Diagnosis not present

## 2021-10-05 DIAGNOSIS — E876 Hypokalemia: Secondary | ICD-10-CM | POA: Diagnosis not present

## 2021-10-05 DIAGNOSIS — D509 Iron deficiency anemia, unspecified: Secondary | ICD-10-CM | POA: Diagnosis not present

## 2021-10-05 DIAGNOSIS — N1832 Chronic kidney disease, stage 3b: Secondary | ICD-10-CM | POA: Diagnosis not present

## 2021-10-05 DIAGNOSIS — R809 Proteinuria, unspecified: Secondary | ICD-10-CM | POA: Diagnosis not present

## 2021-10-05 DIAGNOSIS — I10 Essential (primary) hypertension: Secondary | ICD-10-CM | POA: Diagnosis not present

## 2021-10-05 DIAGNOSIS — E119 Type 2 diabetes mellitus without complications: Secondary | ICD-10-CM | POA: Diagnosis not present

## 2021-10-05 DIAGNOSIS — E785 Hyperlipidemia, unspecified: Secondary | ICD-10-CM | POA: Diagnosis not present

## 2021-10-05 NOTE — Telephone Encounter (Signed)
Requested Prescriptions  Pending Prescriptions Disp Refills  . metoprolol succinate (TOPROL-XL) 25 MG 24 hr tablet [Pharmacy Med Name: METOPROLOL SUCC ER 25 MG TAB] 90 tablet 1    Sig: TAKE 1 TABLET (25 MG TOTAL) BY MOUTH DAILY.     Cardiovascular:  Beta Blockers Passed - 10/05/2021  1:33 AM      Passed - Last BP in normal range    BP Readings from Last 1 Encounters:  09/13/21 120/60         Passed - Last Heart Rate in normal range    Pulse Readings from Last 1 Encounters:  09/13/21 65         Passed - Valid encounter within last 6 months    Recent Outpatient Visits          3 weeks ago Syncope, unspecified syncope type   [facility] Jerrol Banana., MD   1 month ago Type 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, without long-term current use of insulin [facility])   [facility] Jerrol Banana., MD   2 months ago Stomatitis and mucositis   [facility] Tally Joe T, FNP   5 months ago Seasonal allergic rhinitis due to pollen   [facility] Rosanna Randy, Retia Passe., MD   7 months ago Diabetes mellitus due to underlying condition with diabetic nephropathy, without long-term current use of insulin [facility])   [facility] Jerrol Banana., MD      Future Appointments            In 3 months Jerrol Banana., MD [facility], [place]   In 4 months Jerrol Banana., MD [facility], [place]

## 2021-10-12 ENCOUNTER — Telehealth: Payer: Self-pay | Admitting: Family Medicine

## 2021-10-12 IMAGING — CT CT ABD-PELV W/O CM
2 of 4 series · 16 of 46 positions shown, 18 images · non-contrast
Comparison: 06/22/2019

CLINICAL DATA: Worsening knot on mid abdomen.

EXAM:
CT ABDOMEN AND PELVIS WITHOUT CONTRAST
TECHNIQUE: Multidetector CT imaging of the abdomen and pelvis was performed
following the standard protocol without IV contrast.

[Series 2: axials routine abdomen pelvis without · axial · non-contrast · 0.60mm/px · z∈[-1463,-1093]mm · 13 of 82 slices shown, 15 images]
[im 4/82  soft-tissue]
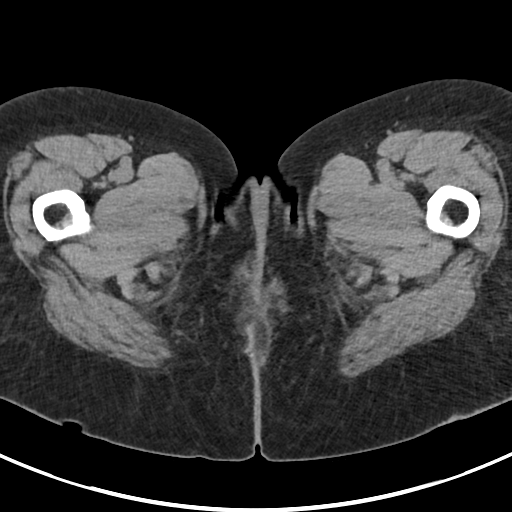
[im 4/82  bone]
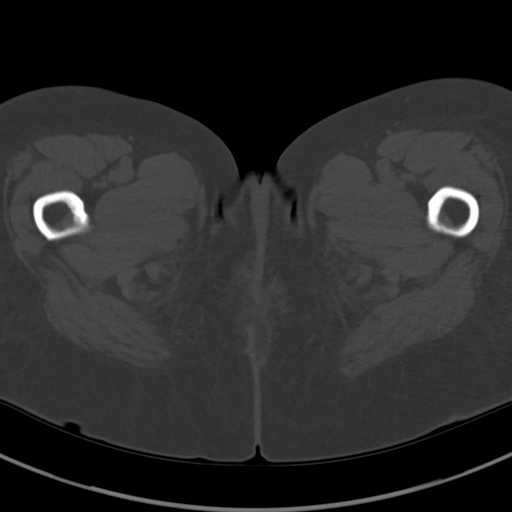
[im 11/82  soft-tissue]
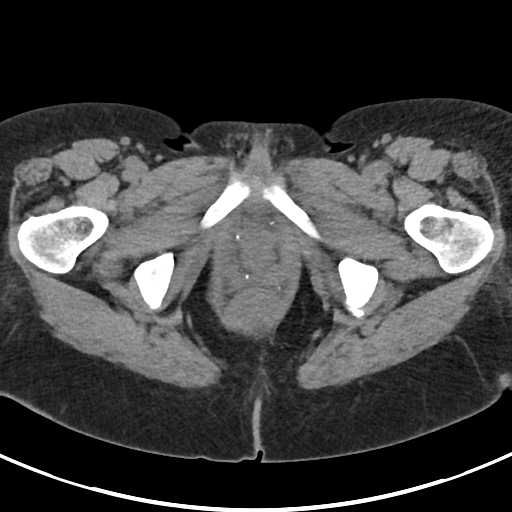
[im 17/82  soft-tissue]
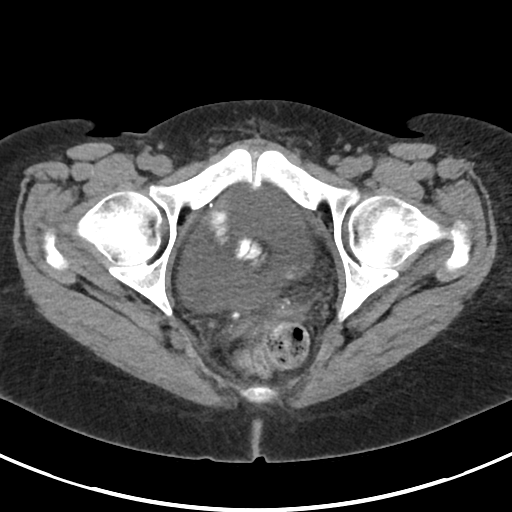
[im 24/82  soft-tissue]
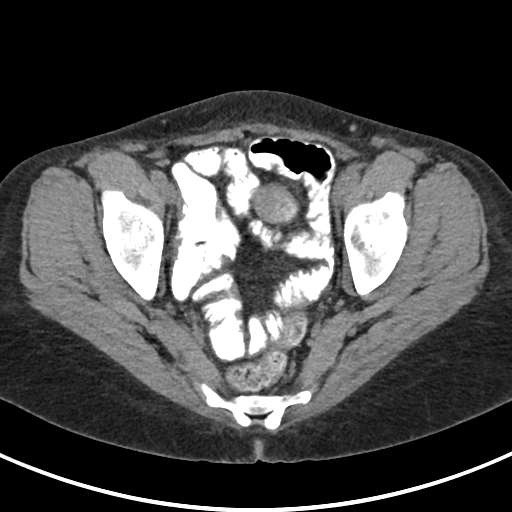
[im 28/82  soft-tissue]
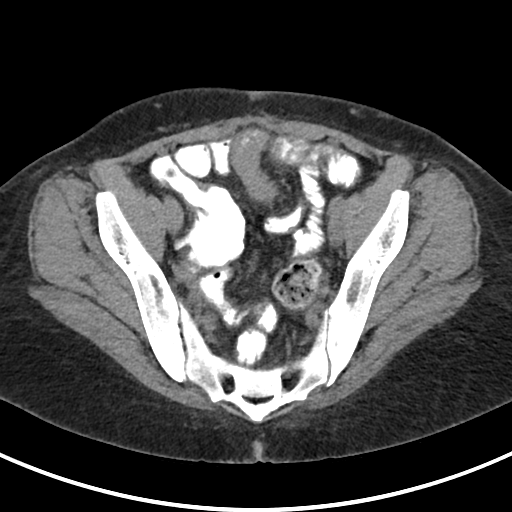
[im 34/82  soft-tissue]
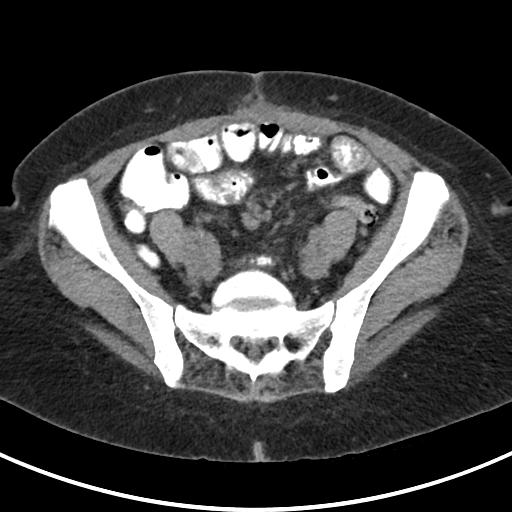
[im 41/82  soft-tissue]
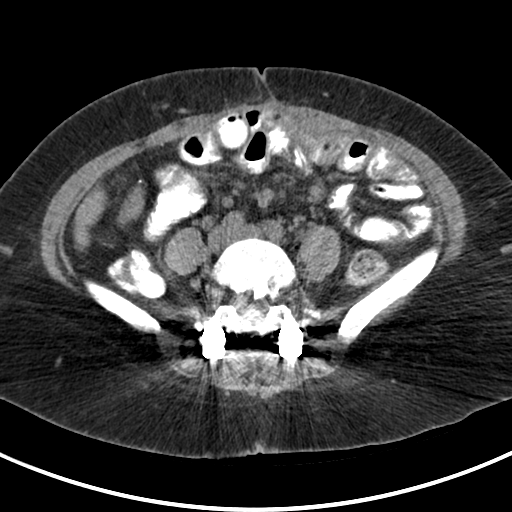
[im 48/82  soft-tissue]
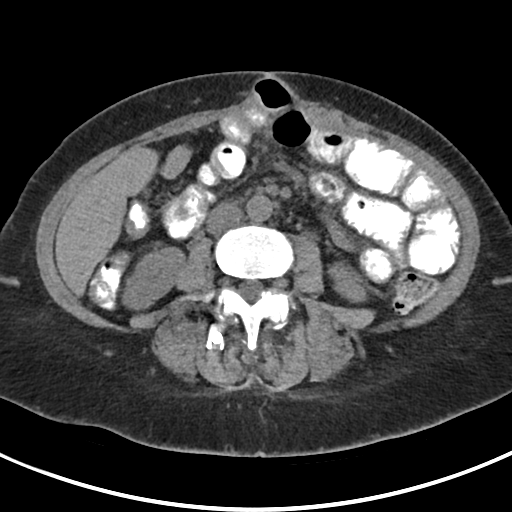
[im 55/82  soft-tissue]
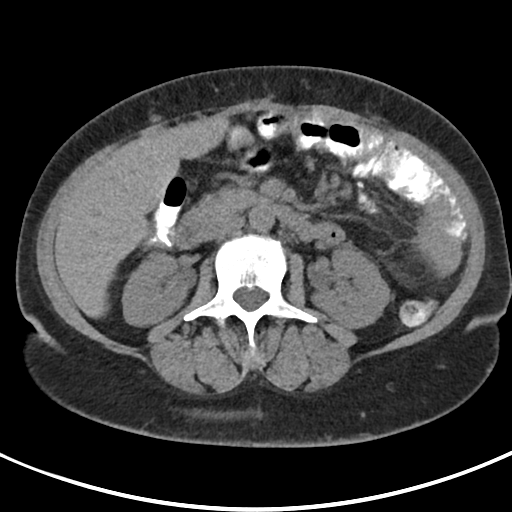
[im 55/82  bone]
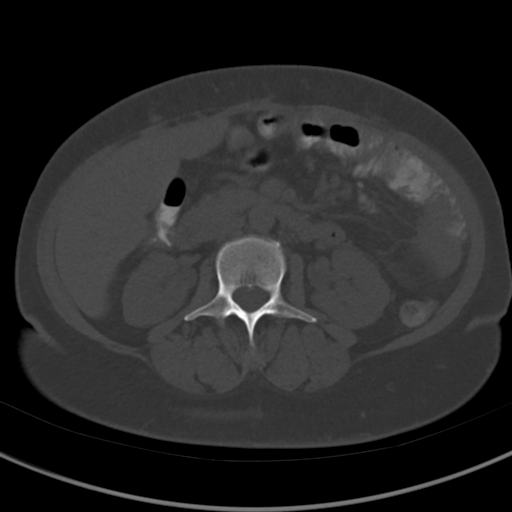
[im 58/82  soft-tissue]
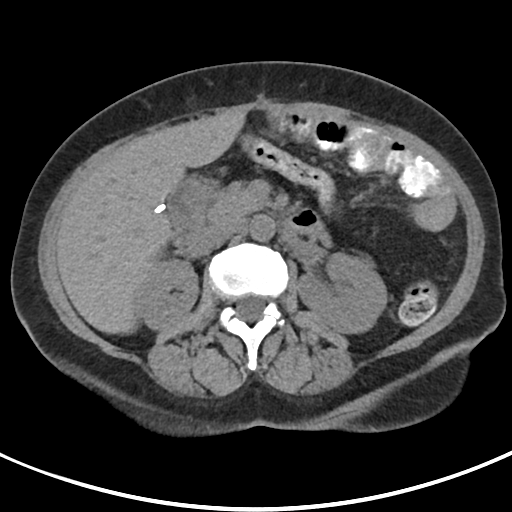
[im 65/82  soft-tissue]
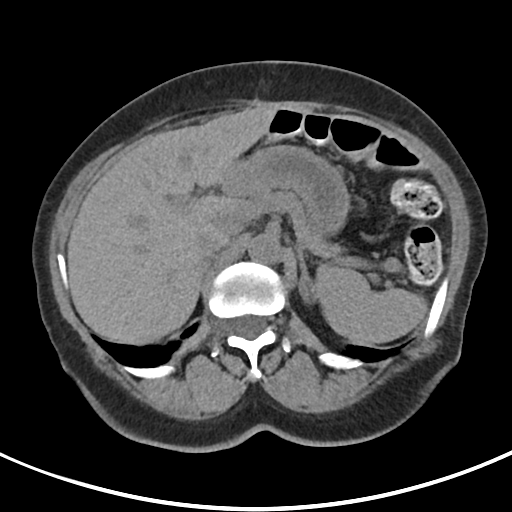
[im 71/82  soft-tissue]
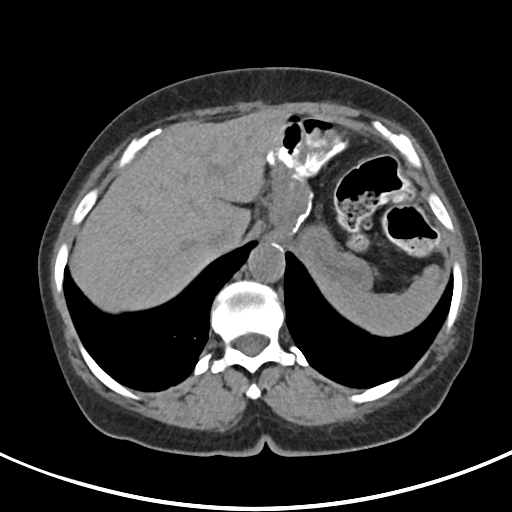
[im 78/82  soft-tissue]
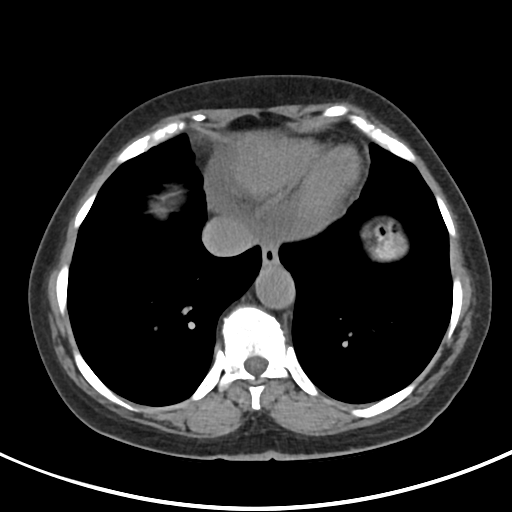

[Series 4: coronals routine abdomen pelvis without · coronal · non-contrast · 0.60mm/px · 3 of 122 slices shown]
[im 41/122  soft-tissue]
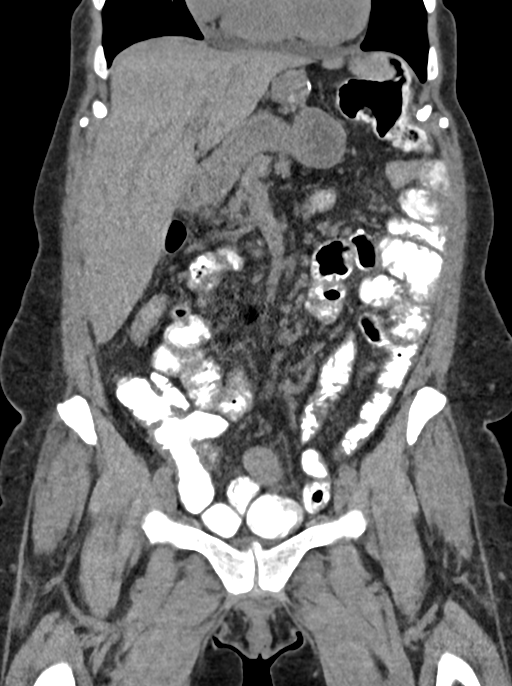
[im 54/122  soft-tissue]
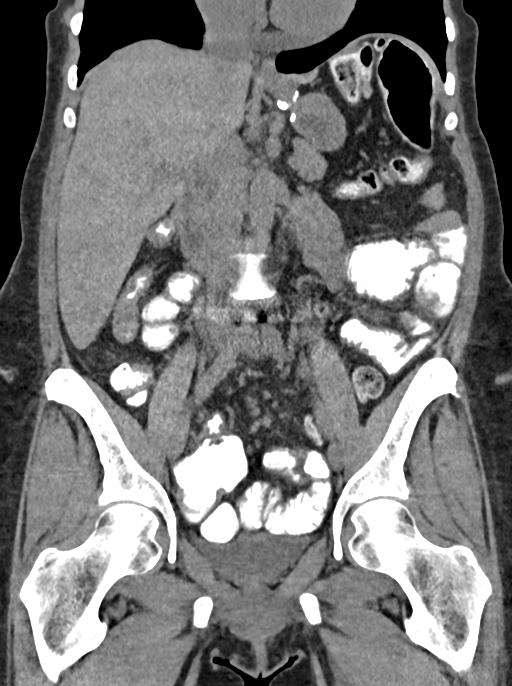
[im 68/122  soft-tissue]
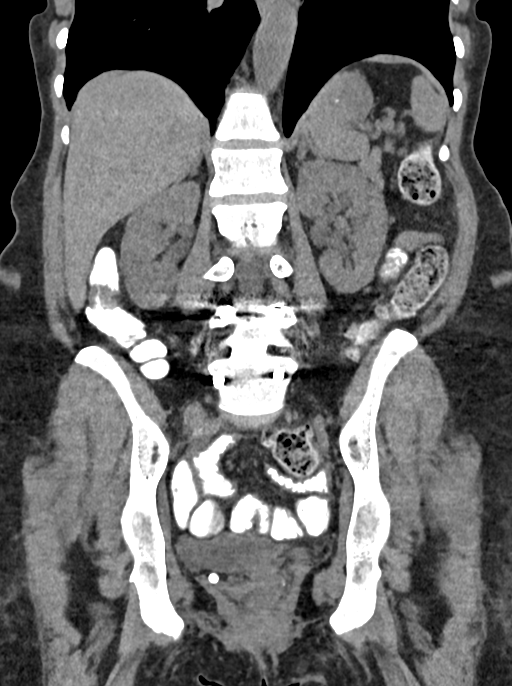

[16 of 46 positions shown; findings below may reference images not displayed]

FINDINGS: Lower chest: Lung bases are clear. No effusions. Heart is normal
size.

Hepatobiliary: No focal liver abnormality is seen. Status post
cholecystectomy. No biliary dilatation.

Pancreas: No focal abnormality or ductal dilatation.

Spleen: No focal abnormality.  Normal size.

Adrenals/Urinary Tract: No adrenal abnormality. No focal renal
abnormality. No stones or hydronephrosis. Urinary bladder is
unremarkable.

Stomach/Bowel: Status post Roux-en-Y gastric bypass. Midline ventral
hernia which could be incisional hernia noted which contains a
knuckle of small bowel. No evidence of bowel obstruction.

Vascular/Lymphatic: No evidence of aneurysm or adenopathy.

Reproductive: Prior hysterectomy.  No adnexal masses.

Other: No free fluid or free air.

Musculoskeletal: No acute bony abnormality.
IMPRESSION: Midline ventral hernia, possibly incisional hernia. It is difficult
to visualize the umbilicus but this is likely just superior to the
umbilicus. This contains a knuckle of small bowel. No evidence of
bowel obstruction.

Prior gastric bypass.

## 2021-10-12 NOTE — Telephone Encounter (Signed)
error 

## 2021-10-16 ENCOUNTER — Ambulatory Visit: Payer: Medicare HMO | Admitting: Obstetrics & Gynecology

## 2021-10-16 DIAGNOSIS — E782 Mixed hyperlipidemia: Secondary | ICD-10-CM

## 2021-10-16 DIAGNOSIS — I10 Essential (primary) hypertension: Secondary | ICD-10-CM | POA: Diagnosis not present

## 2021-10-17 ENCOUNTER — Telehealth: Payer: Self-pay | Admitting: Urology

## 2021-10-17 ENCOUNTER — Encounter: Payer: Self-pay | Admitting: Oncology

## 2021-10-17 ENCOUNTER — Telehealth: Payer: Self-pay

## 2021-10-17 DIAGNOSIS — Z20822 Contact with and (suspected) exposure to covid-19: Secondary | ICD-10-CM | POA: Diagnosis not present

## 2021-10-17 DIAGNOSIS — Z03818 Encounter for observation for suspected exposure to other biological agents ruled out: Secondary | ICD-10-CM | POA: Diagnosis not present

## 2021-10-17 NOTE — Telephone Encounter (Signed)
Patient tested positive today for Covid. She called stating she is coughing her head off and wants something called in for it.  [facility] [address]

## 2021-10-17 NOTE — Telephone Encounter (Signed)
DOS  10/23/21  METATARSAL OSTEOTOMY 5TH LEFT --- [address]  [facility] EFFECTIVE DATE - 10/17/21  PLAN DEDUCTIBLE - $0.00  OUT OF POCKET - $7,550.00 W/ $7,550.00 REMAINING COINSURANCE - 20% COPAY - $0.00  PER [employer] WEB SITE FOR CPT CODE 47092 Notification or Prior Authorization is not required for the requested services  Decision ID #:[id number]

## 2021-10-18 ENCOUNTER — Ambulatory Visit: Payer: Self-pay | Admitting: *Deleted

## 2021-10-18 NOTE — Telephone Encounter (Signed)
Called patient to review symptoms. Patient c/o cough, chills, no taste/smell, weakness, shortness of breath with coughing and exertion. Reports she is dizzy and must hold to something to walk to bathroom. Fever yesterday 102. Has not rechecked temp. Today. Reports symptoms started Friday 10/13/21 after going to grandson football game. Tested for covid 10/16/21 via drive through clinic and tested positive. C/o heart racing . Instructed patient to go to ED now. Patient required encouragement to seek help now. Instructed patient if she is too weak to walk to car please call 911. Patient reports her daughter also recommended ED. Care advise reviewed. Patient verbalized understanding of care advise and to go to ED.  Patient ended call when she c/o she was going to "throw up".

## 2021-10-18 NOTE — Telephone Encounter (Signed)
The patient has tested positive for COVID 19 on Monday 10/16/21 at a drive through clinic   The patient would like to be prescribed something to help with their cough   The patient is experiencing a cough as well as loss of taste and smell and chills   Please contact further when possible  Reason for Disposition  Patient sounds very sick or weak to the triager  Answer Assessment - Initial Assessment Questions 1. COVID-19 DIAGNOSIS: "Who made your COVID-19 diagnosis?" "Was it confirmed by a positive lab test or self-test?" If not diagnosed by a doctor (or NP/PA), ask "Are there lots of cases (community spread) where you live?" Note: See public health department website, if unsure.     Positive covid from drive through clinic unknown place on 10/16/21 2. COVID-19 EXPOSURE: "Was there any known exposure to COVID before the symptoms began?" CDC Definition of close contact: within 6 feet (2 meters) for a total of 15 minutes or more over a 24-hour period.      Went to a football game and started feeling bad Friday  3. ONSET: "When did the COVID-19 symptoms start?"      10/13/21 4. WORST SYMPTOM: "What is your worst symptom?" (e.g., cough, fever, shortness of breath, muscle aches)     Shortness of breath with coughing  weakness , chills, dizziness 5. COUGH: "Do you have a cough?" If Yes, ask: "How bad is the cough?"       Yes constant coughing  6. FEVER: "Do you have a fever?" If Yes, ask: "What is your temperature, how was it measured, and when did it start?"     Had fever yesterday 102 did not check today  7. RESPIRATORY STATUS: "Describe your breathing?" (e.g., shortness of breath, wheezing, unable to speak)      Shortness of breath with coughing and exertion 8. BETTER-SAME-WORSE: "Are you getting better, staying the same or getting worse compared to yesterday?"  If getting worse, ask, "In what way?"     worse 9. HIGH RISK DISEASE: "Do you have any chronic medical problems?" (e.g., asthma,  heart or lung disease, weak immune system, obesity, etc.)     Heart attack , diabetes  10. VACCINE: "Have you had the COVID-19 vaccine?" If Yes, ask: "Which one, how many shots, when did you get it?"       Yes  11. BOOSTER: "Have you received your COVID-19 booster?" If Yes, ask: "Which one and when did you get it?"       yes 12. PREGNANCY: "Is there any chance you are pregnant?" "When was your last menstrual period?"       na 13. OTHER SYMPTOMS: "Do you have any other symptoms?"  (e.g., chills, fatigue, headache, loss of smell or taste, muscle pain, sore throat)       Chills, loss of taste / smell, cough, weakness , dizziness heart racing  14. O2 SATURATION MONITOR:  "Do you use an oxygen saturation monitor (pulse oximeter) at home?" If Yes, ask "What is your reading (oxygen level) today?" "What is your usual oxygen saturation reading?" (e.g., 95%)       na  Protocols used: Coronavirus (COVID-19) Diagnosed or Suspected-A-AH

## 2021-10-19 ENCOUNTER — Other Ambulatory Visit: Payer: Self-pay | Admitting: Family Medicine

## 2021-10-20 ENCOUNTER — Telehealth: Payer: Self-pay

## 2021-10-20 ENCOUNTER — Ambulatory Visit: Payer: Self-pay | Admitting: *Deleted

## 2021-10-20 DIAGNOSIS — U071 COVID-19: Secondary | ICD-10-CM

## 2021-10-20 MED ORDER — MOLNUPIRAVIR EUA 200MG CAPSULE: 4.0000 | ORAL_CAPSULE | Freq: Two times a day (BID) | ORAL | 0 refills | Status: AC

## 2021-10-20 NOTE — Telephone Encounter (Signed)
C/o persistent symptoms of covid. Patient triaged on 10/18/21 and recommended to go to ED for evaluation due to difficulty breathing, weakness and difficulty walking. Patient reports she call 911 to take her to ED and EMS recommended she stay home and treat symptoms due to hospital is full and she would have to sit and wait. Patient is not having breathing difficulties but c/o she is weaker and now has nausea and vomiting. C/o persistent cough, headache, no taste and poor appetite and nausea. Patient has been taking zofran for nausea and that has been the only way she can eat anything. Trying to stay hydrated. Hx heart attack and diabetic. Please advise. Patient would like to speak to PCP. No available appt . Patient feels she will not be seen in ED. Care advise given . Patient verbalized understanding of care advise and to call back or go to ED if symptoms worsen. Please advise any medications to help treat symptoms.

## 2021-10-20 NOTE — Telephone Encounter (Signed)
Please advise patient.  

## 2021-10-20 NOTE — Progress Notes (Signed)
Chronic Care Management Pharmacy Assistant   Name: Julie Acevedo  MRN: [id number] DOB: [birth year]  Reason for Encounter: Hypertension Disease State Call  Recent office visits:  None ID  Recent consult visits:  10/05/2021 Lyla Son, MD (Nephrology) for Follow-up- Patient instructed to decrease HCTZ to 12.5 mg daily, no orders placed, patient instructed to return in 3 months  09/26/2021 Boneta Lucks, DPM (Podiatry) for Callouses- No medication changes noted, DG Foot Complete Left order placed, no follow-up noted  Hospital visits:  None in previous 6 months  Medications: Outpatient Encounter Medications as of 10/20/2021  Medication Sig   aspirin EC 81 MG EC tablet Take 1 tablet (81 mg total) by mouth daily. Swallow whole.   atorvastatin (LIPITOR) 40 MG tablet Take 1 tablet (40 mg total) by mouth daily.   calcitRIOL (ROCALTROL) 0.25 MCG capsule Take 0.25 mcg by mouth daily.   cyclobenzaprine (FLEXERIL) 5 MG tablet TAKE 1 TABLET BY MOUTH THREE TIMES A DAY AS NEEDED   gabapentin (NEURONTIN) 100 MG capsule Take 2 capsules (200 mg total) by mouth 2 (two) times daily.   hydrochlorothiazide (HYDRODIURIL) 25 MG tablet Take 1 tablet (25 mg total) by mouth daily.   LINZESS 290 MCG CAPS capsule TAKE 1 CAPSULE BY MOUTH EVERY DAY BEFORE BREAKFAST   magic mouthwash (lidocaine, diphenhydrAMINE, alum & mag hydroxide) suspension Swish and swallow 5 mLs 4 (four) times daily as needed for mouth pain. (Patient not taking: No sig reported)   Melatonin 10 MG CAPS Take 10 mg by mouth at bedtime as needed.   metFORMIN (GLUCOPHAGE) 500 MG tablet TAKE 1 TABLET (500 MG TOTAL) BY MOUTH 2 (TWO) TIMES DAILY WITH A MEAL.   metoprolol succinate (TOPROL-XL) 25 MG 24 hr tablet TAKE 1 TABLET (25 MG TOTAL) BY MOUTH DAILY.   ondansetron (ZOFRAN) 4 MG tablet TAKE 1 TABLET BY MOUTH EVERY 8 HOURS AS NEEDED   polyethylene glycol powder (GLYCOLAX/MIRALAX) 17 GM/SCOOP powder Take 17 g by mouth daily as needed for moderate  constipation.   rivastigmine (EXELON) 3 MG capsule Take 3 mg by mouth 2 (two) times daily.   traMADol (ULTRAM) 50 MG tablet Take 1 tablet (50 mg total) by mouth every 6 (six) hours as needed.   traZODone (DESYREL) 50 MG tablet TAKE 1 TABLET (50 MG TOTAL) BY MOUTH AT BEDTIME AS NEEDED.   triamcinolone ointment (KENALOG) 0.5 % APPLY TO AFFECTED AREA TWICE A DAY   Vaginal Lubricant (REPLENS) GEL Place 1 Applicatorful vaginally 3 (three) times a week.   No facility-administered encounter medications on file as of 10/20/2021.   Care Gaps: PNA Vaccine COVID-19 Vaccine Booster 5 Diabetic Eye Exam Urine Microalbumin  Star Rating Drugs: Atorvastatin 40 mg last filled on 09/08/2021 for a 90-Day supply with [facility] Metformin 500 mg last filled on 07/16/2021 for a 90-Day supply with [facility]  Reviewed chart prior to disease state call. Spoke with patient regarding BP  Recent Office Vitals: BP Readings from Last 3 Encounters:  09/13/21 120/60  09/08/21 (!) 102/57  09/05/21 129/66   Pulse Readings from Last 3 Encounters:  09/13/21 65  09/08/21 82  09/05/21 84    Wt Readings from Last 3 Encounters:  09/13/21 120 lb (54.4 kg)  09/06/21 114 lb 13.8 oz (52.1 kg)  09/05/21 127 lb (57.6 kg)     Kidney Function Lab Results  Component Value Date/Time   CREATININE 1.42 (H) 09/13/2021 02:33 PM   CREATININE 1.15 (H) 09/07/2021 05:32 AM   CREATININE  1.35 (H) 10/30/2017 10:58 AM   CREATININE 1.15 (H) 08/21/2017 03:32 PM   GFRNONAA 52 (L) 09/07/2021 05:32 AM   GFRNONAA 41 (L) 10/30/2017 10:58 AM   GFRAA 42 (L) 12/05/2020 08:55 AM   GFRAA 48 (L) 10/30/2017 10:58 AM    BMP Latest Ref Rng & Units 09/13/2021 09/07/2021 09/05/2021  Glucose 70 - 99 mg/dL 72 112(H) 332(H)  BUN 8 - 27 mg/dL 13 11 17   Creatinine 0.57 - 1.00 mg/dL 1.42(H) 1.15(H) 1.39(H)  BUN/Creat Ratio 12 - 28 9(L) - -  Sodium 134 - 144 mmol/L 141 139 137  Potassium 3.5 - 5.2 mmol/L 4.4 3.9 3.3(L)  Chloride 96 - 106 mmol/L  102 106 100  CO2 20 - 29 mmol/L 26 26 28   Calcium 8.7 - 10.3 mg/dL 8.9 9.0 8.6(L)    Current antihypertensive regimen:  Metoprolol Succinate 25 mg 1 tablet daily   How often are you checking your Blood Pressure? when feeling symptomatic  Current home BP readings: Patient is not feeling well stated she has not checked her blood pressure in a few days. She denies having hypertensive or Hypotensive symptoms.   What recent interventions/DTPs have been made by any provider to improve Blood Pressure control since last CPP Visit: None ID  Any recent hospitalizations or ED visits since last visit with CPP? No  What diet changes have been made to improve Blood Pressure Control?  Patient was recently diagnosed with COVID and states that she has zero appetite. Patient stated she is not eating anything as everything she tries to eat taste terrible, but she is hydrating with ice water.   What exercise is being done to improve your Blood Pressure Control?  Patient is not exercising. She has been in the bed for the last 4 days due to COVID  Adherence Review: Is the patient currently on ACE/ARB medication? No Does the patient have >5 day gap between last estimated fill dates? No  Patient reports that she is feeling awful today. Patient was recently diagnosed with COVID, on 10/16/2021 and she has been feeling terrible since that date. Patient reports her symptoms consist of nausea, vomiting, fever, cough, headache, and loss of taste. Patient stated she contacted the PCP's Office a few days ago to request a medication be sent into her pharmacy to assist her with her cough, but due to some other symptoms she was directed to go to the ER. Per patient when 911 arrived the EMT advised the patient that the ER was overflowing, and she would be better off at home. Patient stayed home, but has yet to be assisted with her symptoms. I sent [name], CPP a message informing him of the update to find out if he  wanted to speak with the PCP regarding her symptoms, and to see what can be done prior to going into the weekend to assist the patient with medications to help control some of her symptoms.   I contacted the PCP's office, and provided the symptoms and requested a nurse call to Triage her. Patient is not going to the ER due to what EMT's advised. Patient is requesting a cough medication and more nausea medication I informed the front desk receptionist of this. I had already informed the patient that someone form the PCP's office should call her today.  I will follow back up with the patient after the weekend.   Patient has a scheduled telephone visit with CPP on 12/26/2021 @0830   Lynann Bologna, CPA/CMA Clinical Pharmacist Assistant  Phone: [phone]

## 2021-10-20 NOTE — Telephone Encounter (Signed)
If she feels weak or short of breath at all then she needs to go to ED. Sounds like she is dehydrated and may need fluids. Otherwise she just needs to keep herself hydrated. We can also send prescription for molnupiravir for covid if she wants.

## 2021-10-20 NOTE — Telephone Encounter (Signed)
Reason for Disposition  [1] PERSISTING SYMPTOMS OF COVID-19 AND [2] NEW symptom AND [3] could be serious  Answer Assessment - Initial Assessment Questions 1. COVID-19 ONSET: "When did the symptoms of COVID-19 first start?"     10/13/21 2. DIAGNOSIS CONFIRMATION: "How were you diagnosed?" (e.g., COVID-19 oral or nasal viral test; COVID-19 antibody test; doctor visit)     Drive through clinic 10/16/21 3. MAIN SYMPTOM:  "What is your main concern or symptom right now?" (e.g., breathing difficulty, cough, fatigue. loss of smell)     Cough , headache, no taste, nausea, vomiting, poor appetite, weakness 4. SYMPTOM ONSET: "When did the  symptoms   start?"     Started 10/13/21 Continued since 10/18/21 5. BETTER-SAME-WORSE: "Are you getting better, staying the same, or getting worse over the last 1 to 2 weeks?"     worse 6. RECENT MEDICAL VISIT: "Have you been seen by a healthcare provider (doctor, NP, PA) for these persisting COVID-19 symptoms?" If Yes, ask: "When were you seen?" (e.g., date)     Seen by EMS and told she should not go and sit in ED and she would be better off at home  7. COUGH: "Do you have a cough?" If Yes, ask: "How bad is the cough?"       Yes  8. FEVER: "Do you have a fever?" If Yes, ask: "What is your temperature, how was it measured, and when did it start?"     na 9. BREATHING DIFFICULTY: "Are you having any trouble breathing?" If Yes, ask: "How bad is your breathing?" (e.g., mild, moderate, severe)    - MILD: No SOB at rest, mild SOB with walking, speaks normally in sentences, can lie down, no retractions, pulse < 100.    - MODERATE: SOB at rest, SOB with minimal exertion and prefers to sit, cannot lie down flat, speaks in phrases, mild retractions, audible wheezing, pulse 100-120.    - SEVERE: Very SOB at rest, speaks in single words, struggling to breathe, sitting hunched forward, retractions, pulse > 120       Denies  10. HIGH RISK DISEASE: "Do you have any chronic medical  problems?" (e.g., asthma, heart or lung disease, weak immune system, obesity, etc.)       Heart disease and diabetic 11. VACCINE: "Have you gotten the COVID-19 vaccine?" If Yes, ask: "Which one, how many shots, when did you get it?"       Yes  12. BOOSTER: "Have you received your COVID-19 booster?" If Yes, ask: "Which one and when did you get it?"       Yes  13. PREGNANCY: "Is there any chance you are pregnant?" "When was your last menstrual period?"       na 14. OTHER SYMPTOMS: "Do you have any other symptoms?"  (e.g., fatigue, headache, muscle pain, weakness)       Weakness worsening, nausea and poor appetite   15. O2 SATURATION MONITOR:  "Do you use an oxygen saturation monitor (pulse oximeter) at home?" If Yes, ask "What is your reading (oxygen level) today?" "What is your usual oxygen saturation reading?" (e.g., 95%)       na  Protocols used: Coronavirus (COVID-19) Persisting Symptoms Follow-up Call-A-AH

## 2021-10-20 NOTE — Telephone Encounter (Signed)
Pt advised. Please send RX to [facility].    Thanks,   -Mickel Baas

## 2021-10-23 ENCOUNTER — Encounter: Payer: Self-pay | Admitting: Oncology

## 2021-10-25 ENCOUNTER — Other Ambulatory Visit: Payer: Self-pay | Admitting: Family Medicine

## 2021-10-25 DIAGNOSIS — M545 Low back pain, unspecified: Secondary | ICD-10-CM

## 2021-10-25 NOTE — Telephone Encounter (Signed)
Requested medication (s) are due for refill today: Yes  Requested medication (s) are on the active medication list: Yes  Last refill:   60 tablet 3 08/02/2021   Future visit scheduled: No  Notes to clinic:  Unable to refill per protocol, cannot delegate.    Requested Prescriptions  Pending Prescriptions Disp Refills   cyclobenzaprine (FLEXERIL) 5 MG tablet [Pharmacy Med Name: CYCLOBENZAPRINE 5 MG TABLET] 60 tablet 3    Sig: TAKE 1 TABLET BY MOUTH THREE TIMES A DAY AS NEEDED     Not Delegated - Analgesics:  Muscle Relaxants Failed - 10/25/2021  4:02 PM      Failed - This refill cannot be delegated      Passed - Valid encounter within last 6 months    Recent Outpatient Visits           1 month ago Syncope, unspecified syncope type   [facility] Jerrol Banana., MD   1 month ago Type 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, without long-term current use of insulin [facility])   [facility] Jerrol Banana., MD   3 months ago Stomatitis and mucositis   [facility] Joe T, FNP   6 months ago Seasonal allergic rhinitis due to pollen   [facility] Rosanna Randy, Retia Passe., MD   7 months ago Diabetes mellitus due to underlying condition with diabetic nephropathy, without long-term current use of insulin [facility])   [facility] Jerrol Banana., MD       Future Appointments             In 3 months Jerrol Banana., MD [facility], [place]   In 3 months Jerrol Banana., MD [facility], [place]

## 2021-10-26 DIAGNOSIS — Z03818 Encounter for observation for suspected exposure to other biological agents ruled out: Secondary | ICD-10-CM | POA: Diagnosis not present

## 2021-10-26 DIAGNOSIS — Z20822 Contact with and (suspected) exposure to covid-19: Secondary | ICD-10-CM | POA: Diagnosis not present

## 2021-10-26 DIAGNOSIS — H0520 Unspecified exophthalmos: Secondary | ICD-10-CM | POA: Diagnosis not present

## 2021-10-27 ENCOUNTER — Ambulatory Visit: Payer: Self-pay | Admitting: *Deleted

## 2021-10-27 ENCOUNTER — Other Ambulatory Visit (HOSPITAL_COMMUNITY): Payer: Self-pay | Admitting: Oculoplastics Ophthalmology

## 2021-10-27 ENCOUNTER — Other Ambulatory Visit: Payer: Self-pay | Admitting: Oculoplastics Ophthalmology

## 2021-10-27 ENCOUNTER — Telehealth: Payer: Self-pay

## 2021-10-27 DIAGNOSIS — E079 Disorder of thyroid, unspecified: Secondary | ICD-10-CM

## 2021-10-27 DIAGNOSIS — H5789 Other specified disorders of eye and adnexa: Secondary | ICD-10-CM

## 2021-10-27 DIAGNOSIS — H0520 Unspecified exophthalmos: Secondary | ICD-10-CM

## 2021-10-27 NOTE — Telephone Encounter (Signed)
Call received from [facility] form [facility] and reports pharmacists has spoken to patient today and patient sounds weaker than previously noted last week. Tasha requesting NT to call patient due to worsening weakness and symptoms.   Called patient to review symptoms. C/o worseing shortness of breath with exertion. Fatigue , weakness worse. Continues to cough but non productive. C/o dizziness and weakness and needs to hold something to be able to walk. Reports she stays on couch due to weakness. Reports fever 101 this am at 9 am. Reports she is trying to drink plenty of fluids but does report nausea and vomiting at times. Denies chest pain , difficult breathing . Instructed patient she needs evaluation for dehydration and to go to ED. Recommended to call 911 due to she must hold something to walk and can barely get around her house. Patient reports she will call her daughter and if can not take her to ED she will call 911 .    Reason for Disposition  [1] SEVERE weakness (e.g., can't stand or can barely walk) AND [2] new-onset or WORSE  Answer Assessment - Initial Assessment Questions 1. COVID-19 ONSET: "When did the symptoms of COVID-19 first start?"     Prior to 10/17/21 2. DIAGNOSIS CONFIRMATION: "How were you diagnosed?" (e.g., COVID-19 oral or nasal viral test; COVID-19 antibody test; doctor visit)     Postive covid test on 10/17/21 and again tested positive for covid 10/26/21 3. MAIN SYMPTOM:  "What is your main concern or symptom right now?" (e.g., breathing difficulty, cough, fatigue. loss of smell)     Shortness of breath with exertion, fatigue, weakness, poor po intake  5. BETTER-SAME-WORSE: "Are you getting better, staying the same, or getting worse over the last 1 to 2 weeks?"     Worse  6. RECENT MEDICAL VISIT: "Have you been seen by a healthcare provider (doctor, NP, PA) for these persisting COVID-19 symptoms?" If Yes, ask: "When were you seen?" (e.g., date)     Yes . PCP prescribed  molnupuvir  7. COUGH: "Do you have a cough?" If Yes, ask: "How bad is the cough?"       Yes better  8. FEVER: "Do you have a fever?" If Yes, ask: "What is your temperature, how was it measured, and when did it start?"     101 9 am  9. BREATHING DIFFICULTY: "Are you having any trouble breathing?" If Yes, ask: "How bad is your breathing?" (e.g., mild, moderate, severe)    - MILD: No SOB at rest, mild SOB with walking, speaks normally in sentences, can lie down, no retractions, pulse < 100.    - MODERATE: SOB at rest, SOB with minimal exertion and prefers to sit, cannot lie down flat, speaks in phrases, mild retractions, audible wheezing, pulse 100-120.    - SEVERE: Very SOB at rest, speaks in single words, struggling to breathe, sitting hunched forward, retractions, pulse > 120       Moderate  10. HIGH RISK DISEASE: "Do you have any chronic medical problems?" (e.g., asthma, heart or lung disease, weak immune system, obesity, etc.)      See hx  14. OTHER SYMPTOMS: "Do you have any other symptoms?"  (e.g., fatigue, headache, muscle pain, weakness)       Weakness , fatigue cough , shortness of breath with exertion 15. O2 SATURATION MONITOR:  "Do you use an oxygen saturation monitor (pulse oximeter) at home?" If Yes, ask "What is your reading (oxygen level) today?" "What is your  usual oxygen saturation reading?" (e.g., 95%)       na  Protocols used: Coronavirus (COVID-19) Persisting Symptoms Follow-up Call-A-AH

## 2021-10-27 NOTE — Progress Notes (Signed)
    Chronic Care Management Pharmacy Assistant   Name: Julie Acevedo  MRN: [id number] DOB: 31-Oct-1951  COVID-19 Follow-Up  I spoke with the patient, and she reports that she went back yesterday to get re-tested and she is still testing positive for COVID. Patient states she "feels like she has ran the [facility]". Patient sounds really weak, has no energy, and is having some breathing issues. I did try to convince the patient to call 911 or let me call 911, but she insist that she will be okay she just wants to rest.   I am really concerned with the patient, and her symptoms that I reached out to the providers office. I spoke with Angie the nurse whom has been working with the patient and she stated that she would give her a call. I expressed to Angie that the patient sounds weaker, has more shortness of breath, has less energy than she did last Friday. I expressed that it's very concerning with how the patient sounded. The nurse agreed and stated she would give her a call to see if she can convince her to call 911 to get the care she needs.   Medications: Outpatient Encounter Medications as of 10/27/2021  Medication Sig   aspirin EC 81 MG EC tablet Take 1 tablet (81 mg total) by mouth daily. Swallow whole.   atorvastatin (LIPITOR) 40 MG tablet Take 1 tablet (40 mg total) by mouth daily.   calcitRIOL (ROCALTROL) 0.25 MCG capsule Take 0.25 mcg by mouth daily.   cyclobenzaprine (FLEXERIL) 5 MG tablet TAKE 1 TABLET BY MOUTH THREE TIMES A DAY AS NEEDED   gabapentin (NEURONTIN) 100 MG capsule Take 2 capsules (200 mg total) by mouth 2 (two) times daily.   hydrochlorothiazide (HYDRODIURIL) 25 MG tablet Take 1 tablet (25 mg total) by mouth daily.   LINZESS 290 MCG CAPS capsule TAKE 1 CAPSULE BY MOUTH EVERY DAY BEFORE BREAKFAST   magic mouthwash (lidocaine, diphenhydrAMINE, alum & mag hydroxide) suspension Swish and swallow 5 mLs 4 (four) times daily as needed for mouth pain. (Patient not taking: No  sig reported)   Melatonin 10 MG CAPS Take 10 mg by mouth at bedtime as needed.   metFORMIN (GLUCOPHAGE) 500 MG tablet TAKE 1 TABLET (500 MG TOTAL) BY MOUTH 2 (TWO) TIMES DAILY WITH A MEAL.   metoprolol succinate (TOPROL-XL) 25 MG 24 hr tablet TAKE 1 TABLET (25 MG TOTAL) BY MOUTH DAILY.   ondansetron (ZOFRAN) 4 MG tablet TAKE 1 TABLET BY MOUTH EVERY 8 HOURS AS NEEDED   polyethylene glycol powder (GLYCOLAX/MIRALAX) 17 GM/SCOOP powder Take 17 g by mouth daily as needed for moderate constipation.   rivastigmine (EXELON) 3 MG capsule Take 3 mg by mouth 2 (two) times daily.   traMADol (ULTRAM) 50 MG tablet Take 1 tablet (50 mg total) by mouth every 6 (six) hours as needed.   traZODone (DESYREL) 50 MG tablet TAKE 1 TABLET (50 MG TOTAL) BY MOUTH AT BEDTIME AS NEEDED.   triamcinolone ointment (KENALOG) 0.5 % APPLY TO AFFECTED AREA TWICE A DAY   Vaginal Lubricant (REPLENS) GEL Place 1 Applicatorful vaginally 3 (three) times a week.   No facility-administered encounter medications on file as of 10/27/2021.   Lynann Bologna, CPA/CMA Clinical Pharmacist Assistant Phone: [phone]

## 2021-10-31 ENCOUNTER — Encounter: Payer: Medicare HMO | Admitting: Podiatry

## 2021-11-02 ENCOUNTER — Telehealth: Payer: Self-pay

## 2021-11-02 ENCOUNTER — Ambulatory Visit: Payer: Medicare Other

## 2021-11-02 NOTE — Telephone Encounter (Signed)
Julie Acevedo called to cancel her surgery with Dr. Posey Pronto on 11/06/2021. She's still testing positive for Covid and still feels bad. She stated she will call me back when she feels better since this is the 2nd time she has had to move her surgery. Notified Dr. Posey Pronto and Caren Griffins with [place]

## 2021-11-03 DIAGNOSIS — Z03818 Encounter for observation for suspected exposure to other biological agents ruled out: Secondary | ICD-10-CM | POA: Diagnosis not present

## 2021-11-03 DIAGNOSIS — Z20822 Contact with and (suspected) exposure to covid-19: Secondary | ICD-10-CM | POA: Diagnosis not present

## 2021-11-08 ENCOUNTER — Telehealth: Payer: Self-pay | Admitting: Oncology

## 2021-11-08 ENCOUNTER — Other Ambulatory Visit: Payer: Self-pay | Admitting: Family Medicine

## 2021-11-08 DIAGNOSIS — Z1231 Encounter for screening mammogram for malignant neoplasm of breast: Secondary | ICD-10-CM

## 2021-11-08 NOTE — Telephone Encounter (Signed)
Pt called to reschedule her appt. Please give her a call at [phone]

## 2021-11-08 NOTE — Telephone Encounter (Signed)
Which appt would need to be rescheduled? Her last appt was in September, i'm assuming that one?

## 2021-11-13 ENCOUNTER — Other Ambulatory Visit: Payer: Self-pay | Admitting: Family Medicine

## 2021-11-13 DIAGNOSIS — K130 Diseases of lips: Secondary | ICD-10-CM

## 2021-11-14 ENCOUNTER — Encounter: Payer: Self-pay | Admitting: Obstetrics & Gynecology

## 2021-11-14 ENCOUNTER — Other Ambulatory Visit: Payer: Self-pay

## 2021-11-14 ENCOUNTER — Ambulatory Visit (INDEPENDENT_AMBULATORY_CARE_PROVIDER_SITE_OTHER): Payer: Medicare Other | Admitting: Obstetrics & Gynecology

## 2021-11-14 ENCOUNTER — Encounter: Payer: Medicare HMO | Admitting: Podiatry

## 2021-11-14 ENCOUNTER — Other Ambulatory Visit (HOSPITAL_COMMUNITY)
Admission: RE | Admit: 2021-11-14 | Discharge: 2021-11-14 | Disposition: A | Discharge status: Home / Self Care | Payer: Medicare Other | Source: Ambulatory Visit | Attending: Obstetrics & Gynecology | Admitting: Obstetrics & Gynecology

## 2021-11-14 ENCOUNTER — Encounter: Payer: Medicare Other | Admitting: Podiatry

## 2021-11-14 VITALS — BP 120/80 | Ht 63.0 in | Wt 128.0 lb

## 2021-11-14 DIAGNOSIS — N9419 Other specified dyspareunia: Secondary | ICD-10-CM | POA: Diagnosis not present

## 2021-11-14 DIAGNOSIS — Z01419 Encounter for gynecological examination (general) (routine) without abnormal findings: Secondary | ICD-10-CM

## 2021-11-14 DIAGNOSIS — Z1272 Encounter for screening for malignant neoplasm of vagina: Secondary | ICD-10-CM

## 2021-11-14 DIAGNOSIS — N8111 Cystocele, midline: Secondary | ICD-10-CM | POA: Diagnosis not present

## 2021-11-14 DIAGNOSIS — N952 Postmenopausal atrophic vaginitis: Secondary | ICD-10-CM | POA: Diagnosis not present

## 2021-11-14 DIAGNOSIS — M5416 Radiculopathy, lumbar region: Secondary | ICD-10-CM

## 2021-11-14 IMAGING — MG DIGITAL SCREENING BILAT W/ TOMO W/ CAD
6 of 10 series · 6 of 30 positions shown · non-contrast
Comparison: Previous exam(s).

CLINICAL DATA: Screening.

EXAM:
DIGITAL SCREENING BILATERAL MAMMOGRAM WITH TOMO AND CAD

[R CC synth-2D]
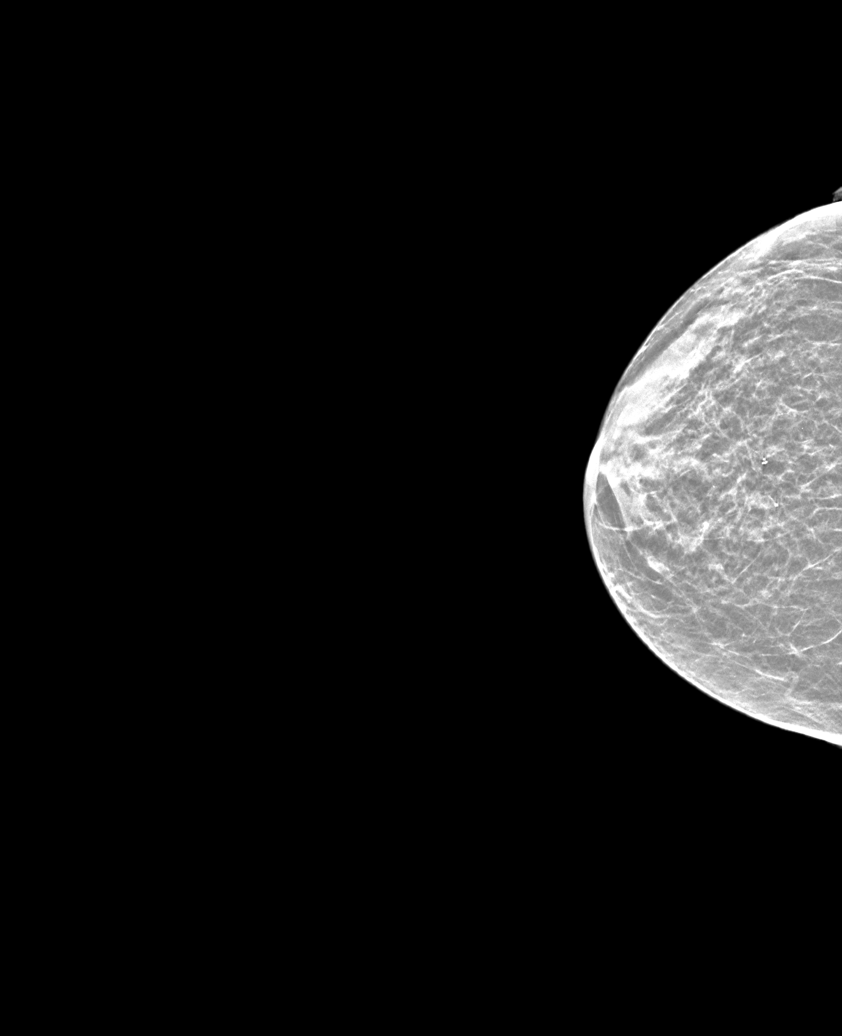

[L CC synth-2D]
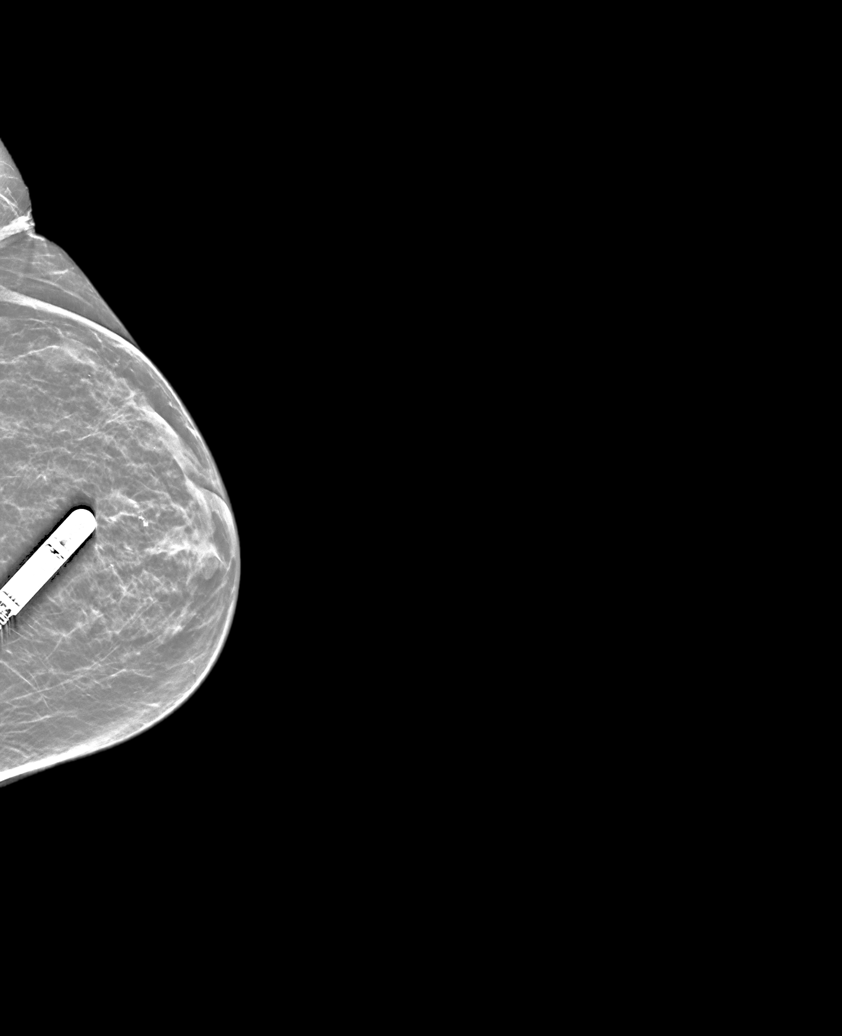

[R MLO synth-2D]
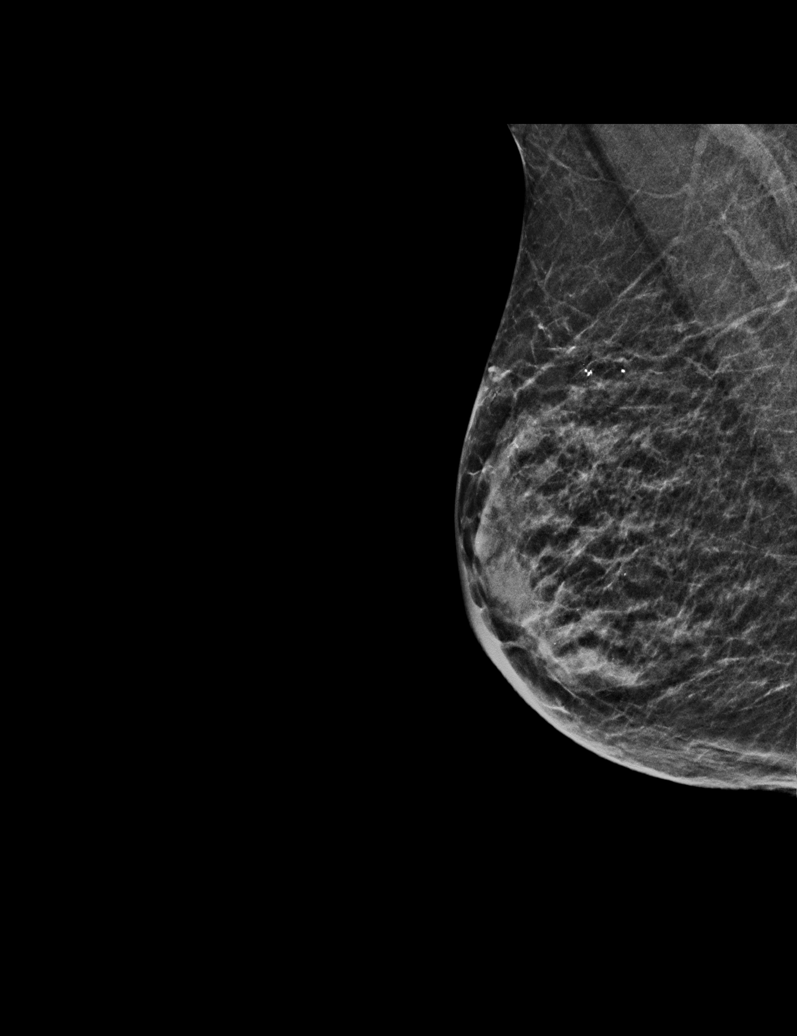

[R XCCL synth-2D]
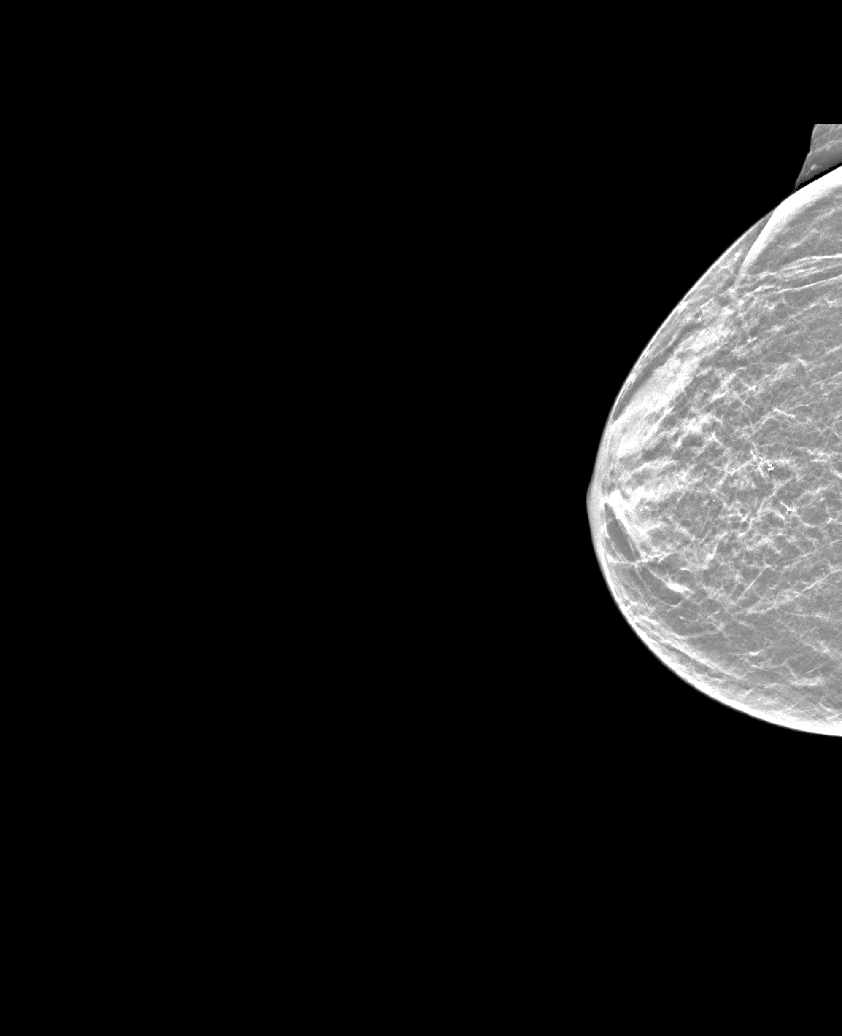

[L MLO synth-2D]
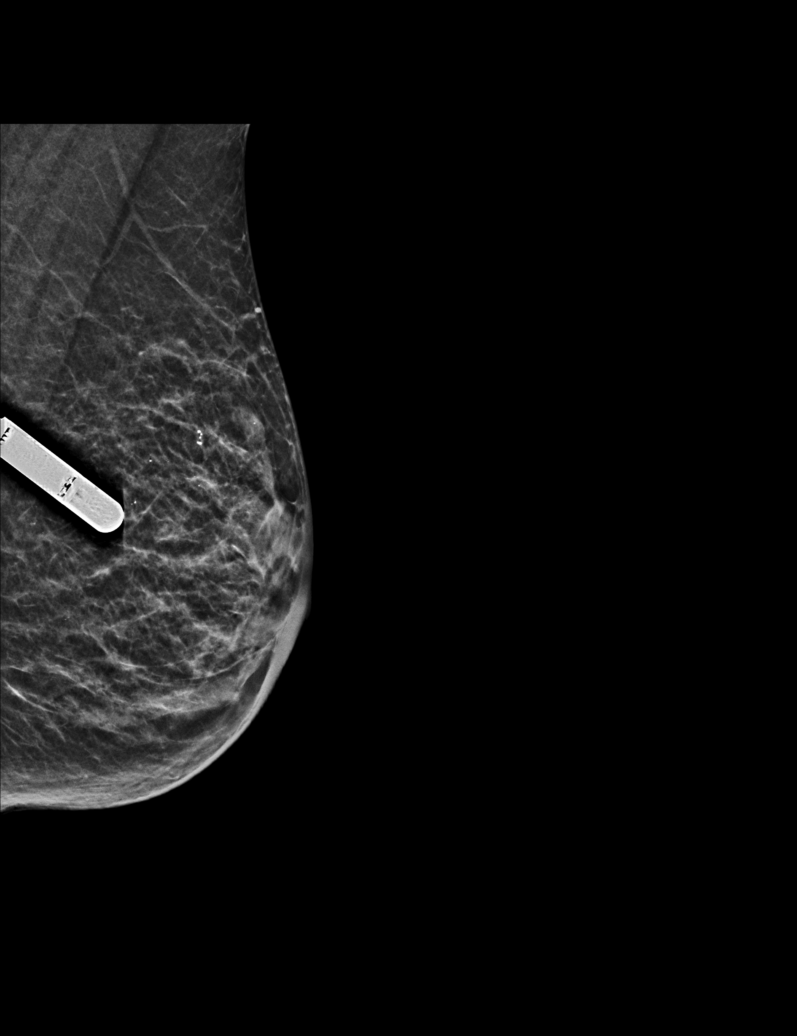

[R XCCL tomo · tomo slice 19/37.0]
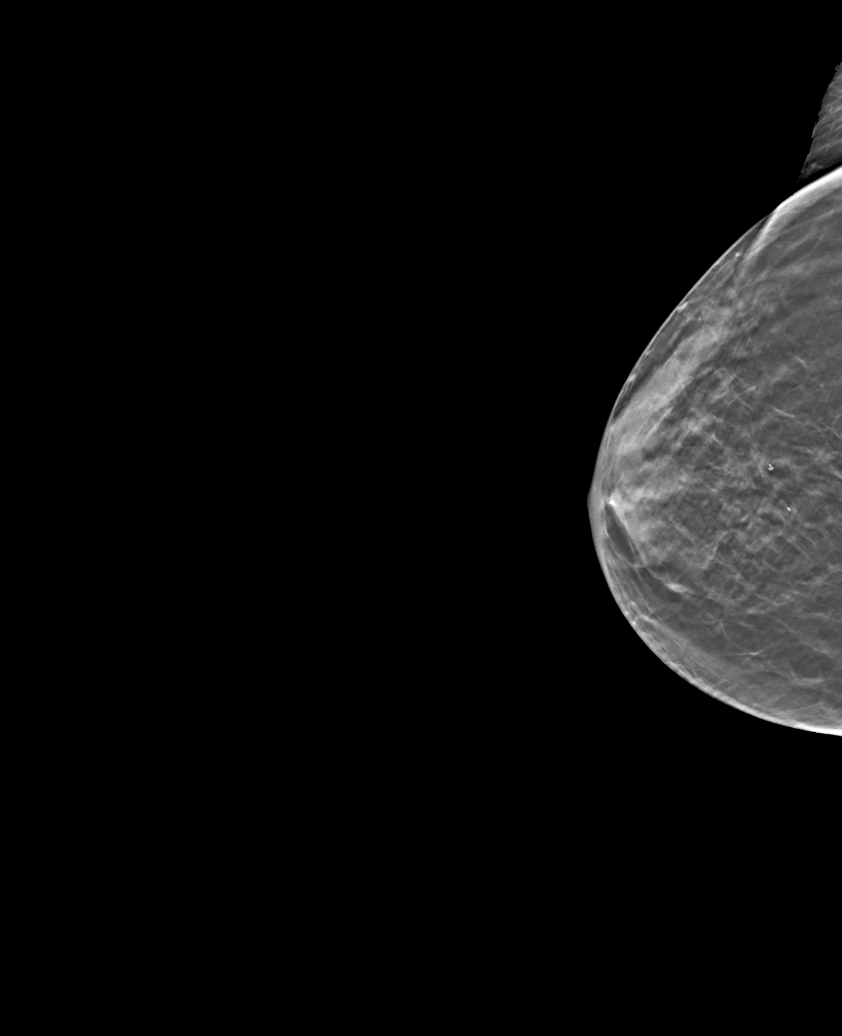

[6 of 30 positions shown; findings below may reference images not displayed]

ACR Breast Density Category c: The breast tissue is heterogeneously
dense, which may obscure small masses.
FINDINGS: There are no findings suspicious for malignancy. Images were
processed with CAD.
IMPRESSION: No mammographic evidence of malignancy. A result letter of this
screening mammogram will be mailed directly to the patient.

RECOMMENDATION:
Screening mammogram in one year. (Code:[id number]-[id number])

BI-RADS CATEGORY  1: Negative.

## 2021-11-14 MED ORDER — HYDROXYZINE PAMOATE 100 MG PO CAPS: 100.0000 mg | ORAL_CAPSULE | Freq: Three times a day (TID) | ORAL | 9 refills | Status: DC

## 2021-11-14 MED ORDER — GABAPENTIN 300 MG PO CAPS: 300.0000 mg | ORAL_CAPSULE | Freq: Three times a day (TID) | ORAL | 3 refills | Status: DC

## 2021-11-14 NOTE — Progress Notes (Signed)
HPI:      [name] is a 70 y.o. G4P3 who LMP was in the past, she presents today for her annual examination.  The patient has no complaints today other than continued deep dyspareunia.  She only has pain w sex.  She has improved moistuirization w Intrarosa and Replens. The patient is sexually active. Herlast pap: was normal and last mammogram: approximate date 2021 and was normal.  The patient does perform self breast exams.  There is notable family history of breast or ovarian cancer in her family. The patient is not taking hormone replacement therapy. Patient denies post-menopausal vaginal bleeding.   The patient has regular exercise: yes. The patient denies current symptoms of depression.    GYN Hx: Last Colonoscopy:3 years ago. Normal.  Last DEXA: year ago.    PMHx: Past Medical History:  Diagnosis Date   Anemia    Arthritis    COVID-19 11/19/2019   Diverticulitis    DM (diabetes mellitus) ([place])    typ e 2   History of methicillin resistant staphylococcus aureus (MRSA) 2017   HTN (hypertension)    Hyperlipidemia    Memory difficulties 09/01/2015   Multiple thyroid nodules    Myocardial infarction ([place]) 1995   Short-term memory loss    Thyroid nodule    Past Surgical History:  Procedure Laterality Date   ABDOMINAL HYSTERECTOMY     due to endometriosis-1 ovary left   BREAST EXCISIONAL BIOPSY Right 1971   neg   CARDIAC CATHETERIZATION  2000   no significant CAD per note of Dr Caryl Comes in 2013    [place] N/A 03/19/2017   Procedure: ANTERIOR REPAIR (CYSTOCELE);  Surgeon: Bjorn Loser, MD;  Location: [facility];  Service: Urology;  Laterality: N/A;   CYSTOSCOPY N/A 03/19/2017   Procedure: CYSTOSCOPY;  Surgeon: Bjorn Loser, MD;  Location: [facility];  Service: Urology;  Laterality: N/A;   EYE SURGERY     FOOT SURGERY     HAND SURGERY     HEMICOLECTOMY     INSERTION OF MESH N/A 08/21/2016   Procedure: INSERTION OF MESH;  Surgeon: Excell Seltzer, MD;  Location: [facility];  Service: General;  Laterality: N/A;   INSERTION OF MESH N/A 12/08/2019   Procedure: INSERTION OF MESH;  Surgeon: Jules Husbands, MD;  Location: [facility];  Service: General;  Laterality: N/A;   LAPAROSCOPIC LYSIS OF ADHESIONS N/A 08/21/2016   Procedure: LAPAROSCOPIC LYSIS OF ADHESIONS;  Surgeon: Excell Seltzer, MD;  Location: [facility];  Service: General;  Laterality: N/A;   LAPAROSCOPIC REMOVAL ABDOMINAL MASS     LAPAROTOMY N/A 04/16/2019   Procedure: EXPLORATORY LAPAROTOMY;  Surgeon: Benjamine Sprague, DO;  Location: [facility];  Service: General;  Laterality: N/A;   LOOP RECORDER INSERTION N/A 03/21/2018   Procedure: LOOP RECORDER INSERTION;  Surgeon: Sanda Klein, MD;  Location: [facility];  Service: Cardiovascular;  Laterality: N/A;   LUMBAR DISC SURGERY     ROUX-EN-Y GASTRIC BYPASS  2010   Dr Hassell Done   SMALL INTESTINE SURGERY     TONSILLECTOMY     UPPER GI ENDOSCOPY  01/12/2016   normal larynx, normal esophagus. gastric bypass with a normal-sized pouch and intact staple line, normal examined jejunum, otherwise exam was normal   VENTRAL HERNIA REPAIR N/A 08/21/2016   Procedure: LAPAROSCOPIC VENTRAL HERNIA;  Surgeon: Excell Seltzer, MD;  Location: [facility];  Service: General;  Laterality: N/A;   XI ROBOTIC ASSISTED VENTRAL HERNIA  N/A 12/08/2019   Procedure: XI ROBOTIC ASSISTED VENTRAL HERNIA;  Surgeon: Jules Husbands, MD;  Location: [facility];  Service: General;  Laterality: N/A;   Family History  Problem Relation Age of Onset   Cancer Father 20       Lung   Coronary artery disease Father 40   COPD Mother    Diabetes Mother    Hypertension Mother    Cancer Paternal Grandmother        Breast   Breast cancer Paternal Grandmother    Congestive Heart Failure Maternal Grandmother    Emphysema Maternal Grandfather    Heart attack Paternal Grandfather    Cancer Paternal Aunt        Breast   Breast cancer Paternal Aunt 35   Breast cancer Paternal  Aunt 38   Social History   Tobacco Use   Smoking status: Never   Smokeless tobacco: Never  Vaping Use   Vaping Use: Never used  Substance Use Topics   Alcohol use: No   Drug use: No    Current Outpatient Medications:    aspirin EC 81 MG EC tablet, Take 1 tablet (81 mg total) by mouth daily. Swallow whole., Disp: 30 tablet, Rfl: 11   atorvastatin (LIPITOR) 40 MG tablet, Take 1 tablet (40 mg total) by mouth daily., Disp: 30 tablet, Rfl: 2   calcitRIOL (ROCALTROL) 0.25 MCG capsule, Take 0.25 mcg by mouth daily., Disp: , Rfl:    cyclobenzaprine (FLEXERIL) 5 MG tablet, TAKE 1 TABLET BY MOUTH THREE TIMES A DAY AS NEEDED, Disp: 60 tablet, Rfl: 3   hydrochlorothiazide (HYDRODIURIL) 25 MG tablet, Take 1 tablet (25 mg total) by mouth daily., Disp: 90 tablet, Rfl: 1   hydrOXYzine (VISTARIL) 100 MG capsule, Take 1 capsule (100 mg total) by mouth 3 (three) times daily., Disp: 90 capsule, Rfl: 9   LINZESS 290 MCG CAPS capsule, TAKE 1 CAPSULE BY MOUTH EVERY DAY BEFORE BREAKFAST, Disp: 90 capsule, Rfl: 0   magic mouthwash (lidocaine, diphenhydrAMINE, alum & mag hydroxide) suspension, Swish and swallow 5 mLs 4 (four) times daily as needed for mouth pain., Disp: 360 mL, Rfl: 0   Melatonin 10 MG CAPS, Take 10 mg by mouth at bedtime as needed., Disp: , Rfl:    metFORMIN (GLUCOPHAGE) 500 MG tablet, TAKE 1 TABLET (500 MG TOTAL) BY MOUTH 2 (TWO) TIMES DAILY WITH A MEAL., Disp: 180 tablet, Rfl: 3   metoprolol succinate (TOPROL-XL) 25 MG 24 hr tablet, TAKE 1 TABLET (25 MG TOTAL) BY MOUTH DAILY., Disp: 90 tablet, Rfl: 1   ondansetron (ZOFRAN) 4 MG tablet, TAKE 1 TABLET BY MOUTH EVERY 8 HOURS AS NEEDED, Disp: 20 tablet, Rfl: 0   polyethylene glycol powder (GLYCOLAX/MIRALAX) 17 GM/SCOOP powder, Take 17 g by mouth daily as needed for moderate constipation., Disp: 255 g, Rfl: 0   rivastigmine (EXELON) 3 MG capsule, Take 3 mg by mouth 2 (two) times daily., Disp: , Rfl:    traMADol (ULTRAM) 50 MG tablet, Take 1 tablet  (50 mg total) by mouth every 6 (six) hours as needed., Disp: 100 tablet, Rfl: 2   traZODone (DESYREL) 50 MG tablet, TAKE 1 TABLET (50 MG TOTAL) BY MOUTH AT BEDTIME AS NEEDED., Disp: 90 tablet, Rfl: 4   triamcinolone ointment (KENALOG) 0.5 %, APPLY TO AFFECTED AREA TWICE A DAY, Disp: 30 g, Rfl: 0   Vaginal Lubricant (REPLENS) GEL, Place 1 Applicatorful vaginally 3 (three) times a week., Disp: 35 g, Rfl: 11   gabapentin (NEURONTIN) 300  MG capsule, Take 1 capsule (300 mg total) by mouth 3 (three) times daily., Disp: 180 capsule, Rfl: 3 Allergies: Lotrel [amlodipine besy-benazepril hcl], Contrast media [iodinated diagnostic agents], Niacin, and Sulfa antibiotics  Review of Systems  Constitutional:  Negative for chills, fever and malaise/fatigue.  HENT:  Negative for congestion, sinus pain and sore throat.   Eyes:  Negative for blurred vision and pain.  Respiratory:  Negative for cough and wheezing.   Cardiovascular:  Negative for chest pain and leg swelling.  Gastrointestinal:  Negative for abdominal pain, constipation, diarrhea, heartburn, nausea and vomiting.  Genitourinary:  Negative for dysuria, frequency, hematuria and urgency.  Musculoskeletal:  Negative for back pain, joint pain, myalgias and neck pain.  Skin:  Negative for itching and rash.  Neurological:  Negative for dizziness, tremors and weakness.  Endo/Heme/Allergies:  Does not bruise/bleed easily.  Psychiatric/Behavioral:  Negative for depression. The patient is not nervous/anxious and does not have insomnia.    Objective: BP 120/80   Ht 5\' 3"  (1.6 m)   Wt 128 lb (58.1 kg)   BMI 22.67 kg/m   Filed Weights   11/14/21 1334  Weight: 128 lb (58.1 kg)   Body mass index is 22.67 kg/m. Physical Exam Constitutional:      General: She is not in acute distress.    Appearance: She is well-developed.  Genitourinary:     Vulva, bladder, rectum and urethral meatus normal.     No lesions in the vagina.     Genitourinary Comments:  Vaginal cuff well healed     Right Labia: No rash, tenderness or lesions.    Left Labia: No tenderness, lesions or rash.    Vaginal cuff intact.    No vaginal bleeding.     No vaginal prolapse present.    Mild vaginal atrophy present.     Right Adnexa: not tender and no mass present.    Left Adnexa: not tender and no mass present.    Cervix is absent.     Uterus is absent.     Bladder exam comments: Gr 1 cystocele.     Pelvic exam was performed with patient in the lithotomy position.  Breasts:    Right: No mass, skin change or tenderness.     Left: No mass, skin change or tenderness.  HENT:     Head: Normocephalic and atraumatic. No laceration.     Right Ear: Hearing normal.     Left Ear: Hearing normal.     Mouth/Throat:     Pharynx: Uvula midline.  Eyes:     Pupils: Pupils are equal, round, and reactive to light.  Neck:     Thyroid: No thyromegaly.  Cardiovascular:     Rate and Rhythm: Normal rate and regular rhythm.     Heart sounds: No murmur heard.   No friction rub. No gallop.  Pulmonary:     Effort: Pulmonary effort is normal. No respiratory distress.     Breath sounds: Normal breath sounds. No wheezing.  Abdominal:     General: Bowel sounds are normal. There is no distension.     Palpations: Abdomen is soft.     Tenderness: There is no abdominal tenderness. There is no rebound.  Musculoskeletal:        General: Normal range of motion.     Cervical back: Normal range of motion and neck supple.  Neurological:     Mental Status: She is alert and oriented to person, place, and time.  Cranial Nerves: No cranial nerve deficit.  Skin:    General: Skin is warm and dry.  Psychiatric:        Judgment: Judgment normal.  Vitals reviewed.    Assessment: Annual Exam 1. Women's annual routine gynecological examination   2. Vaginal atrophy   3. Dyspareunia due to medical condition in female   4. Cystocele, midline   5. Screening for vaginal cancer   6.  Radiculopathy, lumbar region     Plan:            1. Vaginal Screening-  Pap smear done today  2. Breast screening- Exam annually and mammogram scheduled  3. Colonoscopy every 10 years, Hemoccult testing after age 81  4. Labs managed by PCP  5. Counseling for hormonal therapy: none Cont Intrarosa for vag atrophy and dyspareunia Also, Replens              6. Dyspareunia -  Increase Gabapentin to 300 mg TID and try to add in Vistaril 1-3 times daily Cont Intrarosa and Replens    F/U  Return in about 1 year (around 11/14/2022) for Annual.  Barnett Applebaum, MD, [facility], [facility] 11/14/2021  1:57 PM

## 2021-11-14 NOTE — Patient Instructions (Signed)
Gabapentin 300 mg pills 3 times a day Vistaril 100 mg pills 1 to 3 times a day  Thank you for choosing [facility]. As part of our ongoing efforts to improve patient experience, we would appreciate your feedback. Please fill out the short survey that you will receive by mail or MyChart. Your opinion is important to [place]! -Dr Kenton Kingfisher  Recommendations to boost your immunity to prevent illness such as viral flu and colds, including covid19, are as follows:       - - -  Vitamin K2 and Vitamin D3  - - - Take Vitamin K2 at 200-300 mcg daily (usually 2-3 pills daily of the over the counter formulation). Take Vitamin D3 at 3000-4000 U daily (usually 3-4 pills daily of the over the counter formulation). Studies show that these two at high normal levels in your system are very effective in keeping your immunity so strong and protective that you will be unlikely to contract viral illness such as those listed above.  Dr Kenton Kingfisher

## 2021-11-17 LAB — CYTOLOGY - PAP

## 2021-11-18 ENCOUNTER — Telehealth: Payer: Self-pay

## 2021-11-18 NOTE — Progress Notes (Signed)
Chronic Care Management Pharmacy Assistant   Name: Julie Acevedo  MRN: [id number] DOB: 21-Feb-1951  Reason for Encounter: Hypertension Disease State Call   Recent office visits:  None ID  Recent consult visits:  11/14/2021 Teresa Coombs, MD (OB-GYN) for Annual Exam- Started: Hydroxyzine Pamoate 100 mg three times daily, Changed: Gabapentin from 200 mg twice daily to 300 mg three times daily, Cytology-PAP ordered, Return in 1 year  Hospital visits:  None in previous 6 months  Medications: Outpatient Encounter Medications as of 11/18/2021  Medication Sig   aspirin EC 81 MG EC tablet Take 1 tablet (81 mg total) by mouth daily. Swallow whole.   atorvastatin (LIPITOR) 40 MG tablet Take 1 tablet (40 mg total) by mouth daily.   calcitRIOL (ROCALTROL) 0.25 MCG capsule Take 0.25 mcg by mouth daily.   cyclobenzaprine (FLEXERIL) 5 MG tablet TAKE 1 TABLET BY MOUTH THREE TIMES A DAY AS NEEDED   gabapentin (NEURONTIN) 300 MG capsule Take 1 capsule (300 mg total) by mouth 3 (three) times daily.   hydrochlorothiazide (HYDRODIURIL) 25 MG tablet Take 1 tablet (25 mg total) by mouth daily.   hydrOXYzine (VISTARIL) 100 MG capsule Take 1 capsule (100 mg total) by mouth 3 (three) times daily.   LINZESS 290 MCG CAPS capsule TAKE 1 CAPSULE BY MOUTH EVERY DAY BEFORE BREAKFAST   magic mouthwash (lidocaine, diphenhydrAMINE, alum & mag hydroxide) suspension Swish and swallow 5 mLs 4 (four) times daily as needed for mouth pain.   Melatonin 10 MG CAPS Take 10 mg by mouth at bedtime as needed.   metFORMIN (GLUCOPHAGE) 500 MG tablet TAKE 1 TABLET (500 MG TOTAL) BY MOUTH 2 (TWO) TIMES DAILY WITH A MEAL.   metoprolol succinate (TOPROL-XL) 25 MG 24 hr tablet TAKE 1 TABLET (25 MG TOTAL) BY MOUTH DAILY.   ondansetron (ZOFRAN) 4 MG tablet TAKE 1 TABLET BY MOUTH EVERY 8 HOURS AS NEEDED   polyethylene glycol powder (GLYCOLAX/MIRALAX) 17 GM/SCOOP powder Take 17 g by mouth daily as needed for moderate constipation.    rivastigmine (EXELON) 3 MG capsule Take 3 mg by mouth 2 (two) times daily.   traMADol (ULTRAM) 50 MG tablet Take 1 tablet (50 mg total) by mouth every 6 (six) hours as needed.   traZODone (DESYREL) 50 MG tablet TAKE 1 TABLET (50 MG TOTAL) BY MOUTH AT BEDTIME AS NEEDED.   triamcinolone ointment (KENALOG) 0.5 % APPLY TO AFFECTED AREA TWICE A DAY   Vaginal Lubricant (REPLENS) GEL Place 1 Applicatorful vaginally 3 (three) times a week.   No facility-administered encounter medications on file as of 11/18/2021.   Care Gaps: Diabetic Eye Exam Urine Microalbumin  Star Rating Drugs: Atorvastatin 40 mg last filled on 09/08/2021 for a 90-Day supply with [facility] Metformin 500 mg last filled on 07/16/2021 for a 90-Day supply with [facility]  Reviewed chart prior to disease state call. Spoke with patient regarding BP  Recent Office Vitals: BP Readings from Last 3 Encounters:  11/14/21 120/80  09/13/21 120/60  09/08/21 (!) 102/57   Pulse Readings from Last 3 Encounters:  09/13/21 65  09/08/21 82  09/05/21 84    Wt Readings from Last 3 Encounters:  11/14/21 128 lb (58.1 kg)  09/13/21 120 lb (54.4 kg)  09/06/21 114 lb 13.8 oz (52.1 kg)     Kidney Function Lab Results  Component Value Date/Time   CREATININE 1.42 (H) 09/13/2021 02:33 PM   CREATININE 1.15 (H) 09/07/2021 05:32 AM   CREATININE 1.35 (H) 10/30/2017 10:58  AM   CREATININE 1.15 (H) 08/21/2017 03:32 PM   GFRNONAA 52 (L) 09/07/2021 05:32 AM   GFRNONAA 41 (L) 10/30/2017 10:58 AM   GFRAA 42 (L) 12/05/2020 08:55 AM   GFRAA 48 (L) 10/30/2017 10:58 AM    BMP Latest Ref Rng & Units 09/13/2021 09/07/2021 09/05/2021  Glucose 70 - 99 mg/dL 72 112(H) 332(H)  BUN 8 - 27 mg/dL 13 11 17   Creatinine 0.57 - 1.00 mg/dL 1.42(H) 1.15(H) 1.39(H)  BUN/Creat Ratio 12 - 28 9(L) - -  Sodium 134 - 144 mmol/L 141 139 137  Potassium 3.5 - 5.2 mmol/L 4.4 3.9 3.3(L)  Chloride 96 - 106 mmol/L 102 106 100  CO2 20 - 29 mmol/L 26 26 28   Calcium 8.7 -  10.3 mg/dL 8.9 9.0 8.6(L)    Current antihypertensive regimen:  Metoprolol succinate 25 mg 1 tablet 25 mg daily  Hydrochlorothiazide 25 mg 1 tablet daily   How often are you checking your Blood Pressure?  Patient has been sick recently, and hasn't been checking it at home. Patient stated she has been to the doctor recently and her blood pressure numbers have been normal  Current home BP readings: No number to provide  What recent interventions/DTPs have been made by any provider to improve Blood Pressure control since last CPP Visit: None ID  Any recent hospitalizations or ED visits since last visit with CPP? No  What diet changes have been made to improve Blood Pressure Control?  Patient is not on any certain diet. At the moment patient advises she is having a hard time with eating. Patient states she has lost a lot of weight. Patient reports she goes to eat, and after a few bites she's full and she just doesn't know why that happens. Patient reports she has spoken to [facility] about this and she has a Endoscopy scheduled for later this month.  What exercise is being done to improve your Blood Pressure Control?  Patient is currently not exercising, but she has starting physical therapy due to tremors she is currently having.   Adherence Review: Is the patient currently on ACE/ARB medication? No Does the patient have >5 day gap between last estimated fill dates? No  Patient reports that she is taking one day at a time. Patient is currently having several medical issues that she is following up with specialist about. Patient recently also had COVID that last several weeks. Patient reports that she is doing better, and finally is over Bernardy and feeling better from that. Patient has no concerns or issues for me at this time. I encouraged patient to give me a call if she needs anything.   Patient has telephone appointment with [name], CPP on 01/0/2023 @ [place],  [place] Pharmacist Assistant Phone: [phone]

## 2021-11-22 ENCOUNTER — Encounter: Payer: Self-pay | Admitting: Oncology

## 2021-11-22 ENCOUNTER — Ambulatory Visit: Payer: Self-pay | Admitting: *Deleted

## 2021-11-22 ENCOUNTER — Encounter: Payer: Self-pay | Admitting: Physician Assistant

## 2021-11-22 ENCOUNTER — Ambulatory Visit (INDEPENDENT_AMBULATORY_CARE_PROVIDER_SITE_OTHER): Payer: Medicare Other | Admitting: Physician Assistant

## 2021-11-22 ENCOUNTER — Other Ambulatory Visit: Payer: Self-pay

## 2021-11-22 VITALS — BP 97/59 | HR 65 | Temp 98.6°F | Ht 63.0 in | Wt 125.0 lb

## 2021-11-22 DIAGNOSIS — F02A0 Dementia in other diseases classified elsewhere, mild, without behavioral disturbance, psychotic disturbance, mood disturbance, and anxiety: Secondary | ICD-10-CM | POA: Insufficient documentation

## 2021-11-22 DIAGNOSIS — R251 Tremor, unspecified: Secondary | ICD-10-CM

## 2021-11-22 DIAGNOSIS — G3183 Dementia with Lewy bodies: Secondary | ICD-10-CM | POA: Diagnosis not present

## 2021-11-22 NOTE — Assessment & Plan Note (Signed)
New sxs of tremors. Will try to coordinate moving up the neurologist f/u appointment. Ref to PT/OT. Advised patient strongly to not drive. Explained risks.

## 2021-11-22 NOTE — Telephone Encounter (Signed)
Patient called in says has shakes and can hardly hold anything, no appt until January, wants to know what she can do.   Attempted to reach pt x 3 consecutive calls. CAll connects then disconnects or pt hangs up. Unable to leave message. Will continue attempts to reach pt.

## 2021-11-22 NOTE — Telephone Encounter (Signed)
Pt returning a call from another triage nurse.  Pt had called in earlier today c/o shaking badly and having difficulty holding things and wanting to know what she could do.       Pt c/o shaking in her hands that started 3 months ago that is getting worse.    I was diagnosed with dementia.    It's in both hands.  I'm losing my balance frequently too.   My legs give out.    I called into [facility] for an appt since Dr. Rosanna Randy is out on medical leave.  They were able to schedule her with Mikey Kirschner, PA for today at 3:40.   (My line with the pt got disconnected)  I called pt back and this time does work for her.   (If it did not I was to call [facility] back and let them know).     I apologized for the line disconnect.     I wasn't able to locate a protocol for this complaint.

## 2021-11-22 NOTE — Progress Notes (Signed)
Date:  11/22/2021   Name:  Julie Acevedo   DOB:  September 16, 1951   MRN:  [id number]   Chief Complaint: Tremors x 2-3 mo  Julie Acevedo is a 70 y/o female with PMH of TIA, [name], HTN, CKD who presents today with upper and lower body tremors that have been increasing in severity for the last 2-3 months. She follows with a neurologist, last visit 9/22 after her TIA, and states the tremors were not present then. She was prescribed Exelon which she states she is consistent with.  Reports the tremors make it difficult to carry things, and to walk-- that they frequently cause her to trip and fall.  Denies any current pain, injury, SOB, CP, headaches. Denies numbness/tingling. Family history of Parkinson's disease. Denies any current physical therapy/occupational therapy/home health aid.   Lab Results  Component Value Date   NA 141 09/13/2021   K 4.4 09/13/2021   CO2 26 09/13/2021   GLUCOSE 72 09/13/2021   BUN 13 09/13/2021   CREATININE 1.42 (H) 09/13/2021   CALCIUM 8.9 09/13/2021   EGFR 40 (L) 09/13/2021   GFRNONAA 52 (L) 09/07/2021   Lab Results  Component Value Date   CHOL 134 09/06/2021   HDL 60 09/06/2021   LDLCALC 63 09/06/2021   TRIG 54 09/06/2021   CHOLHDL 2.2 09/06/2021   Lab Results  Component Value Date   TSH 1.896 06/28/2021   Lab Results  Component Value Date   HGBA1C 5.6 09/06/2021   Lab Results  Component Value Date   WBC 8.6 09/13/2021   HGB 11.3 09/13/2021   HCT 33.5 (L) 09/13/2021   MCV 87 09/13/2021   PLT 262 09/13/2021   Lab Results  Component Value Date   ALT 7 09/05/2021   AST 17 09/05/2021   ALKPHOS 152 (H) 09/05/2021   BILITOT 0.6 09/05/2021   Lab Results  Component Value Date   VD25OH 8.7 (L) 06/16/2019     Review of Systems  Constitutional:  Negative for fatigue.  Respiratory:  Negative for shortness of breath.   Cardiovascular:  Negative for chest pain.  Neurological:  Positive for tremors. Negative for weakness.   Patient Active  Problem List   Diagnosis Date Noted   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, psychotic disturbance, mood disturbance, or anxiety ([place]) 11/22/2021   TIA (transient ischemic attack) 09/05/2021   Stomatitis and mucositis 07/21/2021   Cheilitis 07/21/2021   Callus of foot 01/19/2021   Pes planus 01/19/2021   PTTD (posterior tibial tendon dysfunction) 01/19/2021   Acute flank pain 12/15/2020   Cystitis 12/15/2020   Urinary symptom or sign 12/15/2020   Low back pain radiating to right lower extremity 06/23/2020   Gastroesophageal reflux disease 04/28/2020   Large bowel obstruction ([place]) 04/17/2019   Acquired trigger finger 03/20/2019   Iron deficiency anemia 11/21/2018   Syncope 03/18/2018   Anemia 03/18/2018   Exophthalmos 02/22/2018   Multiple thyroid nodules 02/22/2018   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   Pain at surgical site 08/30/2016   Ventral incisional hernia 08/21/2016   SBO (small bowel obstruction) (HCC)    Partial small bowel obstruction ([place]) 04/23/2016   Adjustment disorder with mixed anxiety and depressed mood 04/23/2016   Memory difficulties 09/01/2015   Arthritis 06/11/2015   Back pain, chronic 06/11/2015   HLD (hyperlipidemia) 06/11/2015   Cannot sleep 06/11/2015   L-S radiculopathy 06/11/2015   Arthritis of knee, degenerative 06/11/2015   B12 deficiency 06/11/2015   Gastric bypass  status for obesity 05/06/2013   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   DM2 (diabetes mellitus, type 2) ([place]) 11/15/2012   Adiposity 11/21/2009   Avitaminosis D 11/07/2009   Narrowing of intervertebral disc space 07/25/2009   Benign essential HTN 07/25/2009    Allergies  Allergen Reactions   Lotrel [Amlodipine Besy-Benazepril Hcl] Anaphylaxis   Contrast Media [Iodinated Diagnostic Agents] Swelling and Other (See Comments)    Reaction:  Neck swelling    Niacin Hives   Sulfa Antibiotics Hives    Past Surgical History:  Procedure Laterality  Date   ABDOMINAL HYSTERECTOMY     due to endometriosis-1 ovary left   BREAST EXCISIONAL BIOPSY Right 1971   neg   CARDIAC CATHETERIZATION  2000   no significant CAD per note of Dr Caryl Comes in 2013    [place] N/A 03/19/2017   Procedure: ANTERIOR REPAIR (CYSTOCELE);  Surgeon: Bjorn Loser, MD;  Location: [facility];  Service: Urology;  Laterality: N/A;   CYSTOSCOPY N/A 03/19/2017   Procedure: CYSTOSCOPY;  Surgeon: Bjorn Loser, MD;  Location: [facility];  Service: Urology;  Laterality: N/A;   EYE SURGERY     FOOT SURGERY     HAND SURGERY     HEMICOLECTOMY     INSERTION OF MESH N/A 08/21/2016   Procedure: INSERTION OF MESH;  Surgeon: Excell Seltzer, MD;  Location: [facility];  Service: General;  Laterality: N/A;   INSERTION OF MESH N/A 12/08/2019   Procedure: INSERTION OF MESH;  Surgeon: Jules Husbands, MD;  Location: [facility];  Service: General;  Laterality: N/A;   LAPAROSCOPIC LYSIS OF ADHESIONS N/A 08/21/2016   Procedure: LAPAROSCOPIC LYSIS OF ADHESIONS;  Surgeon: Excell Seltzer, MD;  Location: [facility];  Service: General;  Laterality: N/A;   LAPAROSCOPIC REMOVAL ABDOMINAL MASS     LAPAROTOMY N/A 04/16/2019   Procedure: EXPLORATORY LAPAROTOMY;  Surgeon: Benjamine Sprague, DO;  Location: [facility];  Service: General;  Laterality: N/A;   LOOP RECORDER INSERTION N/A 03/21/2018   Procedure: LOOP RECORDER INSERTION;  Surgeon: Sanda Klein, MD;  Location: [facility];  Service: Cardiovascular;  Laterality: N/A;   LUMBAR DISC SURGERY     ROUX-EN-Y GASTRIC BYPASS  2010   Dr Hassell Done   SMALL INTESTINE SURGERY     TONSILLECTOMY     UPPER GI ENDOSCOPY  01/12/2016   normal larynx, normal esophagus. gastric bypass with a normal-sized pouch and intact staple line, normal examined jejunum, otherwise exam was normal   VENTRAL HERNIA REPAIR N/A 08/21/2016   Procedure: LAPAROSCOPIC VENTRAL HERNIA;  Surgeon: Excell Seltzer, MD;  Location: [facility];  Service: General;   Laterality: N/A;   XI ROBOTIC ASSISTED VENTRAL HERNIA N/A 12/08/2019   Procedure: XI ROBOTIC ASSISTED VENTRAL HERNIA;  Surgeon: Jules Husbands, MD;  Location: [facility];  Service: General;  Laterality: N/A;    Social History   Tobacco Use   Smoking status: Never   Smokeless tobacco: Never  Vaping Use   Vaping Use: Never used  Substance Use Topics   Alcohol use: No   Drug use: No     Medication list has been reviewed and updated.  Current Meds  Medication Sig   aspirin EC 81 MG EC tablet Take 1 tablet (81 mg total) by mouth daily. Swallow whole.   atorvastatin (LIPITOR) 40 MG tablet Take 1 tablet (40 mg total) by mouth daily.   calcitRIOL (ROCALTROL) 0.25 MCG capsule Take 0.25 mcg by mouth daily.  cyclobenzaprine (FLEXERIL) 5 MG tablet TAKE 1 TABLET BY MOUTH THREE TIMES A DAY AS NEEDED   gabapentin (NEURONTIN) 300 MG capsule Take 1 capsule (300 mg total) by mouth 3 (three) times daily.   hydrOXYzine (VISTARIL) 100 MG capsule Take 1 capsule (100 mg total) by mouth 3 (three) times daily.   LINZESS 290 MCG CAPS capsule TAKE 1 CAPSULE BY MOUTH EVERY DAY BEFORE BREAKFAST   Melatonin 10 MG CAPS Take 10 mg by mouth at bedtime as needed.   metFORMIN (GLUCOPHAGE) 500 MG tablet TAKE 1 TABLET (500 MG TOTAL) BY MOUTH 2 (TWO) TIMES DAILY WITH A MEAL.   metoprolol succinate (TOPROL-XL) 25 MG 24 hr tablet TAKE 1 TABLET (25 MG TOTAL) BY MOUTH DAILY.   ondansetron (ZOFRAN) 4 MG tablet TAKE 1 TABLET BY MOUTH EVERY 8 HOURS AS NEEDED   rivastigmine (EXELON) 3 MG capsule Take 3 mg by mouth 2 (two) times daily.   traMADol (ULTRAM) 50 MG tablet Take 1 tablet (50 mg total) by mouth every 6 (six) hours as needed.   traZODone (DESYREL) 50 MG tablet TAKE 1 TABLET (50 MG TOTAL) BY MOUTH AT BEDTIME AS NEEDED.   triamcinolone ointment (KENALOG) 0.5 % APPLY TO AFFECTED AREA TWICE A DAY   [DISCONTINUED] hydrochlorothiazide (HYDRODIURIL) 25 MG tablet Take 1 tablet (25 mg total) by mouth daily.   [DISCONTINUED]  magic mouthwash (lidocaine, diphenhydrAMINE, alum & mag hydroxide) suspension Swish and swallow 5 mLs 4 (four) times daily as needed for mouth pain.   [DISCONTINUED] polyethylene glycol powder (GLYCOLAX/MIRALAX) 17 GM/SCOOP powder Take 17 g by mouth daily as needed for moderate constipation.   [DISCONTINUED] Vaginal Lubricant (REPLENS) GEL Place 1 Applicatorful vaginally 3 (three) times a week.    PHQ 2/9 Scores 11/22/2021 06/01/2021 05/29/2021 04/26/2021  PHQ - 2 Score 0 _0 PHQ- 9 Score 8 - 16 -    GAD 7 : Generalized Anxiety Score 11/22/2021  Nervous, Anxious, on Edge 0  Control/stop worrying 0  Worry too much - different things 0  Trouble relaxing 0  Restless 0  Easily annoyed or irritable 0  Afraid - awful might happen 0  Total GAD 7 Score 0  Anxiety Difficulty Not difficult at all    BP Readings from Last 3 Encounters:  11/22/21 (!) 97/59  11/14/21 120/80  09/13/21 120/60    Physical Exam Constitutional:      General: She is awake.     Appearance: She is well-developed.  HENT:     Head: Normocephalic.  Eyes:     Conjunctiva/sclera: Conjunctivae normal.  Cardiovascular:     Rate and Rhythm: Normal rate and regular rhythm.     Heart sounds: Normal heart sounds.  Pulmonary:     Effort: Pulmonary effort is normal.     Breath sounds: Normal breath sounds.  Skin:    General: Skin is warm.  Neurological:     Mental Status: She is alert and oriented to person, place, and time.     Motor: Tremor present. No weakness.     Gait: Gait abnormal.     Comments: Resting tremor predominantly in bilateral shoulders. Tremor with exhaustion--holding out arms. Slow gait with occasionally shuffling.   Psychiatric:        Attention and Perception: Attention normal.        Mood and Affect: Mood normal.        Speech: Speech normal.        Behavior: Behavior is cooperative.    Wt  Readings from Last 3 Encounters:  11/22/21 125 lb (56.7 kg)  11/14/21 128 lb (58.1 kg)  09/13/21 120  lb (54.4 kg)    BP (!) 97/59   Pulse 65   Temp 98.6 F (37 C) (Oral)   Ht _0  (1.6 m)   Wt 125 lb (56.7 kg)   SpO2 96%   BMI 22.14 kg/m   Assessment and Plan: Problem List Items Addressed This Visit       Nervous and Auditory   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, psychotic disturbance, mood disturbance, or anxiety (HCC) - Primary    New sxs of tremors. Will try to coordinate moving up the neurologist f/u appointment. Ref to PT/OT. Advised patient strongly to not drive. Explained risks.        Relevant Orders   Ambulatory referral to Physical Therapy   Ambulatory referral to Occupational Therapy   Ambulatory referral to Neurology   Other Visit Diagnoses     Tremor       Relevant Orders   Ambulatory referral to Physical Therapy   Ambulatory referral to Occupational Therapy   Ambulatory referral to Neurology      I, Mikey Kirschner, PA-C have reviewed all documentation for this visit. The documentation on  11/22/2021 for the exam, diagnosis, procedures, and orders are all accurate and complete.  Mikey Kirschner, PA-C [facility] [address] [place], [place], [address] Office: [phone] Fax: [phone]

## 2021-11-26 ENCOUNTER — Encounter: Payer: Self-pay | Admitting: Obstetrics & Gynecology

## 2021-11-28 ENCOUNTER — Encounter: Payer: Medicare Other | Admitting: Podiatry

## 2021-11-29 ENCOUNTER — Ambulatory Visit: Payer: Medicare Other | Attending: Physician Assistant

## 2021-11-29 ENCOUNTER — Other Ambulatory Visit: Payer: Self-pay

## 2021-11-29 DIAGNOSIS — M6281 Muscle weakness (generalized): Secondary | ICD-10-CM | POA: Insufficient documentation

## 2021-11-29 DIAGNOSIS — G3183 Dementia with Lewy bodies: Secondary | ICD-10-CM | POA: Diagnosis present

## 2021-11-29 DIAGNOSIS — F02A0 Dementia in other diseases classified elsewhere, mild, without behavioral disturbance, psychotic disturbance, mood disturbance, and anxiety: Secondary | ICD-10-CM | POA: Insufficient documentation

## 2021-11-29 DIAGNOSIS — R278 Other lack of coordination: Secondary | ICD-10-CM | POA: Diagnosis not present

## 2021-11-30 ENCOUNTER — Encounter: Payer: Self-pay | Admitting: Oncology

## 2021-11-30 ENCOUNTER — Inpatient Hospital Stay (HOSPITAL_BASED_OUTPATIENT_CLINIC_OR_DEPARTMENT_OTHER): Payer: Medicare Other | Admitting: Oncology

## 2021-11-30 ENCOUNTER — Other Ambulatory Visit: Payer: Self-pay | Admitting: Physician Assistant

## 2021-11-30 ENCOUNTER — Inpatient Hospital Stay: Payer: Medicare Other | Attending: Oncology

## 2021-11-30 ENCOUNTER — Inpatient Hospital Stay: Payer: Medicare Other

## 2021-11-30 VITALS — BP 114/66 | HR 64 | Temp 96.0°F | Resp 16 | Wt 126.4 lb

## 2021-11-30 DIAGNOSIS — F02A0 Dementia in other diseases classified elsewhere, mild, without behavioral disturbance, psychotic disturbance, mood disturbance, and anxiety: Secondary | ICD-10-CM

## 2021-11-30 DIAGNOSIS — Z7982 Long term (current) use of aspirin: Secondary | ICD-10-CM | POA: Diagnosis not present

## 2021-11-30 DIAGNOSIS — G3183 Dementia with Lewy bodies: Secondary | ICD-10-CM | POA: Insufficient documentation

## 2021-11-30 DIAGNOSIS — Z79899 Other long term (current) drug therapy: Secondary | ICD-10-CM | POA: Diagnosis not present

## 2021-11-30 DIAGNOSIS — F0280 Dementia in other diseases classified elsewhere without behavioral disturbance: Secondary | ICD-10-CM | POA: Insufficient documentation

## 2021-11-30 DIAGNOSIS — D509 Iron deficiency anemia, unspecified: Secondary | ICD-10-CM | POA: Insufficient documentation

## 2021-11-30 LAB — CBC WITH DIFFERENTIAL/PLATELET
Abs Immature Granulocytes: 0.01 10*3/uL (ref 0.00–0.07)
Basophils Absolute: 0 10*3/uL (ref 0.0–0.1)
Basophils Relative: 0 %
Eosinophils Absolute: 0.1 10*3/uL (ref 0.0–0.5)
Eosinophils Relative: 2 %
HCT: 33.3 % — ABNORMAL LOW (ref 36.0–46.0)
Hemoglobin: 10.5 g/dL — ABNORMAL LOW (ref 12.0–15.0)
Immature Granulocytes: 0 %
Lymphocytes Relative: 27 %
Lymphs Abs: 2 10*3/uL (ref 0.7–4.0)
MCH: 28.8 pg (ref 26.0–34.0)
MCHC: 31.5 g/dL (ref 30.0–36.0)
MCV: 91.2 fL (ref 80.0–100.0)
Monocytes Absolute: 0.5 10*3/uL (ref 0.1–1.0)
Monocytes Relative: 7 %
Neutro Abs: 4.7 10*3/uL (ref 1.7–7.7)
Neutrophils Relative %: 64 %
Platelets: 285 10*3/uL (ref 150–400)
RBC: 3.65 MIL/uL — ABNORMAL LOW (ref 3.87–5.11)
RDW: 12.9 % (ref 11.5–15.5)
WBC: 7.3 10*3/uL (ref 4.0–10.5)
nRBC: 0 % (ref 0.0–0.2)

## 2021-11-30 LAB — IRON AND TIBC
Iron: 93 ug/dL (ref 28–170)
Saturation Ratios: 50 % — ABNORMAL HIGH (ref 10.4–31.8)
TIBC: 186 ug/dL — ABNORMAL LOW (ref 250–450)
UIBC: 93 ug/dL

## 2021-11-30 LAB — FERRITIN: Ferritin: 361 ng/mL — ABNORMAL HIGH (ref 11–307)

## 2021-11-30 NOTE — Therapy (Signed)
[place] MAIN [facility] SERVICES [address] [place], [place], [address] Phone: [phone]   Fax:  [phone]  Occupational Therapy Evaluation  Patient Details  Name: Julie Acevedo MRN: [id number] Date of Birth: December 04, 1951 Referring Provider (OT): Mikey Kirschner, [place]   Encounter Date: 11/29/2021   OT End of Session - 11/30/21 0846     Visit Number 1    Number of Visits 12    Date for OT Re-Evaluation 01/09/22    Authorization Type Reporting period beginning 11/29/2021    OT Start Time 0845    OT Stop Time 0947    OT Time Calculation (min) 62 min    Activity Tolerance Patient tolerated treatment well    Behavior During Therapy [facility] for tasks assessed/performed             Past Medical History:  Diagnosis Date   Anemia    Arthritis    COVID-19 11/19/2019   Diverticulitis    DM (diabetes mellitus) ([place])    typ e 2   History of methicillin resistant staphylococcus aureus (MRSA) 2017   HTN (hypertension)    Hyperlipidemia    Memory difficulties 09/01/2015   Multiple thyroid nodules    Myocardial infarction ([place]) 1995   Short-term memory loss    Thyroid nodule     Past Surgical History:  Procedure Laterality Date   ABDOMINAL HYSTERECTOMY     due to endometriosis-1 ovary left   BREAST EXCISIONAL BIOPSY Right 1971   neg   CARDIAC CATHETERIZATION  2000   no significant CAD per note of Dr Caryl Comes in 2013    [place] N/A 03/19/2017   Procedure: ANTERIOR REPAIR (CYSTOCELE);  Surgeon: Bjorn Loser, MD;  Location: [facility];  Service: Urology;  Laterality: N/A;   CYSTOSCOPY N/A 03/19/2017   Procedure: CYSTOSCOPY;  Surgeon: Bjorn Loser, MD;  Location: [facility];  Service: Urology;  Laterality: N/A;   EYE SURGERY     FOOT SURGERY     HAND SURGERY     HEMICOLECTOMY     INSERTION OF MESH N/A 08/21/2016   Procedure: INSERTION OF MESH;  Surgeon: Excell Seltzer, MD;  Location: [facility];  Service:  General;  Laterality: N/A;   INSERTION OF MESH N/A 12/08/2019   Procedure: INSERTION OF MESH;  Surgeon: Jules Husbands, MD;  Location: [facility];  Service: General;  Laterality: N/A;   LAPAROSCOPIC LYSIS OF ADHESIONS N/A 08/21/2016   Procedure: LAPAROSCOPIC LYSIS OF ADHESIONS;  Surgeon: Excell Seltzer, MD;  Location: [facility];  Service: General;  Laterality: N/A;   LAPAROSCOPIC REMOVAL ABDOMINAL MASS     LAPAROTOMY N/A 04/16/2019   Procedure: EXPLORATORY LAPAROTOMY;  Surgeon: Benjamine Sprague, DO;  Location: [facility];  Service: General;  Laterality: N/A;   LOOP RECORDER INSERTION N/A 03/21/2018   Procedure: LOOP RECORDER INSERTION;  Surgeon: Sanda Klein, MD;  Location: [place] [facility] LAB;  Service: Cardiovascular;  Laterality: N/A;   LUMBAR DISC SURGERY     ROUX-EN-Y GASTRIC BYPASS  2010   Dr Hassell Done   SMALL INTESTINE SURGERY     TONSILLECTOMY     UPPER GI ENDOSCOPY  01/12/2016   normal larynx, normal esophagus. gastric bypass with a normal-sized pouch and intact staple line, normal examined jejunum, otherwise exam was normal   VENTRAL HERNIA REPAIR N/A 08/21/2016   Procedure: LAPAROSCOPIC VENTRAL HERNIA;  Surgeon: Excell Seltzer, MD;  Location: [facility];  Service: General;  Laterality: N/A;   XI  ROBOTIC ASSISTED VENTRAL HERNIA N/A 12/08/2019   Procedure: XI ROBOTIC ASSISTED VENTRAL HERNIA;  Surgeon: Jules Husbands, MD;  Location: [facility];  Service: General;  Laterality: N/A;    There were no vitals filed for this visit.   Subjective Assessment - 11/30/21 0826     Subjective  "It's embarrassing."  (pt referring to her tremors)    Pertinent History [name], UB/LB tremors, frequent falls    Limitations STM impairment, reports frequent hallucinations, UB/LB tremors, frequent falls    Patient Stated Goals Pt reports she wishes she didn't have tremors but is receptive to strategies to help manage self care tasks with tremors present.    Currently in Pain? No/denies    Multiple  Pain Sites No               [facility] OT Assessment - 11/30/21 0001       Assessment   Medical Diagnosis [name] with UB/LB tremors    Referring Provider (OT) Mikey Kirschner, PA    Onset Date/Surgical Date 11/22/21   date of PCP visit to address tremors   Hand Dominance Left    Next MD Visit Dr. Brigitte Pulse in January    Prior Therapy none      Precautions   Precautions Fall      Restrictions   Weight Bearing Restrictions No      Balance Screen   Has the patient fallen in the past 6 months Yes    How many times? pt reports many times, unable to count    Has the patient had a decrease in activity level because of a fear of falling?  No    Is the patient reluctant to leave their home because of a fear of falling?  No      Home  Environment   Family/patient expects to be discharged to: Private residence    Living Arrangements Spouse/significant other   and adult daughter   Available Help at Discharge Family   daughter moved in a month ago to help with pt's care   Type of [place]   3 steps but planning to have 2 rails and a landing put in next week   [place] One level    Bathroom Shower/Tub Tub/Shower unit   pt having grab bars put in the tub next week   [place]   having elevated seat put in next week with a grab bar   [place] - 2 wheels;Wheelchair - manual   wc in storage and pt does not currently use the walker     Prior Function   Level of Independence Independent   Pt's daughter moved in before Thanksgiving to help with pt's care; prior to decline related to dementia, pt was indep.   Vocation Retired    Biomedical scientist retired from [employer]      ADL   ADL comments Pt denies physical assist for self care, but also reports frequent falls.  Family making handicapped bathroom accommodations next week.      IADL   Medication Management Is not capable of dispensing or managing own  medication   daughter manages medications     Mobility   Mobility Status History of falls    Mobility Status Comments Pt has mobility devices but reports she is not currently using; pt required hand held assist to amb down hallway to OT visit  d/t instability.      Written Expression   Dominant Hand Left    Handwriting 100% legible      Vision - History   Baseline Vision Wears glasses only for reading   Pt reports she's starting to wear her glasses more often than just for reading, but not 100% of the time.   Additional Comments pt reports frequent hallucinations      Cognition   Overall Cognitive Status History of cognitive impairments - at baseline    Memory Impaired    Memory Impairment Decreased short term memory    Cognition Comments decreased insight into deficits      Observation/Other Assessments   Focus on Therapeutic Outcomes (FOTO)  45      Posture/Postural Control   Posture Comments truncal and R sided tremor      Coordination   Gross Motor Movements are Fluid and Coordinated No   mildly impaired RUE d/t tremor   Fine Motor Movements are Fluid and Coordinated No   mildly impaired RUE d/t tremor   Right 9 Hole Peg Test 36 sec    Left 9 Hole Peg Test 26 sec    Tremors Truncal and R side (mild)      AROM   Overall AROM Comments R shoulder flex 118, L 138, R abd 119, L 170      Strength   Overall Strength Comments R shoulder flex/abd 3-, L 4+; bilat elbow/wrist flex/ext 5/5      Hand Function   Right Hand Grip (lbs) 35    Right Hand Lateral Pinch 12 lbs    Right Hand 3 Point Pinch 10 lbs    Left Hand Grip (lbs) 44    Left Hand Lateral Pinch 12 lbs    Left 3 point pinch 10 lbs            Occupational Therapy Evaluation: Pt is a 70 y/o female referred to outpatient Occupational Therapy with dx of mild [name] with UB/LB tremors, hx of frequent falls.  Pt is present today without family and reports that she drove here herself.  Pt reports that she  lives with her daughter and spouse; daughter moved in just before Thanksgiving to assist with pt's care.  Pt reports that spouse also falls a lot, so he is unable to physically assist her.  Pt states she is unsure why she is here today.  OT explained role, OT goals, plan to work towards instructing pt in strategies to manage tremors for improved self care performance, strengthen RUE, instruct in HEP, and provide pt/family education on fall prevention strategies/AE as needed.  OT has concerns of pt still driving.  Pt admitted that she has frequent hallucinations, frequent falls, OT witnessed truncal tremors and RUE tremors, as well as STM deficits during session.  Upon further discussion of pt still driving, pt did report that she has found herself confused before while driving, had to pull over to call daughter, and daughter had her describe her surroundings to guide her back home.  OT will relay this concern to MD office.  Pt has PT evaluation mid January.  Pt is not currently using an AD and required hand held assist to walk down hall way for therapy visit this day d/t instability.  Pt is in agreement with OT poc, and OT will reach out to family to try and be present for education re: ADL safety/AE recommendations/HEP/disease/condition management strategies.      OT Education - 11/30/21  0846     Education Details Role of OT, goals, poc    Person(s) Educated Patient    Methods Explanation;Verbal cues    Comprehension Verbalized understanding;Need further instruction              OT Short Term Goals - 11/30/21 0851       OT SHORT TERM GOAL #1   Title Pt will perform HEP for BUE strengthening and coordination training with mod vc/supv from family member.    Baseline Eval: not yet initiated    Time 3    Period Weeks    Status New    Target Date 12/19/21               OT Long Term Goals - 11/30/21 0852       OT LONG TERM GOAL #1   Title Pt will increase FOTO score to 50 or better  to indicate increased functional performance.    Baseline Eval: FOTO 45    Time 6    Period Weeks    Status New    Target Date 01/09/22      OT LONG TERM GOAL #2   Title Pt will improve R shoulder strength by 1 MM grade to ease tolerance for reaching overhead with self care/IADL tasks.    Baseline Eval: R shoulder strength 3-/5    Time 6    Period Weeks    Status New    Target Date 01/09/22      OT LONG TERM GOAL #3   Title Pt will increase R grip strength by 5 or more lbs to increase tolerance for carrying/transporting heavier ADL supplies.    Baseline Eval: R grip 35 lbs (L dominant is 44 lbs)    Time 6    Period Weeks    Status New    Target Date 01/09/22      OT LONG TERM GOAL #4   Title Pt will ulitize compensatory ADL strategies and AE as needed with mod vc to reduce fall risk with self care and functional mobility.    Baseline Eval: Family making handicapped bathroom modifications next week; frequent falls with ADLs    Time 6    Period Weeks    Status New    Target Date 01/09/22      OT LONG TERM GOAL #5   Title Pt will utilize WB strategies as needed with mod vc to manage tremors during self care tasks.    Baseline Eval: not yet initiated education    Time 6    Period Weeks    Status New    Target Date 01/09/22              Plan - 11/30/21 0848     Clinical Impression Statement Pt is a 70 y/o female referred to outpatient Occupational Therapy with dx of mild [name] with UB/LB tremors, hx of frequent falls.  Pt is present today without family and reports that she drove here herself.  Pt reports that she lives with her daughter and spouse; daughter moved in just before Thanksgiving to assist with pt's care.  Pt reports that spouse also falls a lot, so he is unable to physically assist her.  Pt states she is unsure why she is here today.  OT explained role, OT goals, plan to work towards instructing pt in strategies to manage tremors for improved self  care performance, strengthen RUE, instruct in HEP, and provide pt/family education on fall prevention  strategies/AE as needed.  OT has concerns of pt still driving.  Pt admitted that she has frequent hallucinations, frequent falls, OT witnessed truncal tremors and RUE tremors, as well as STM deficits during session.  Upon further discussion of pt still driving, pt did report that she has found herself confused before while driving, had to pull over to call daughter, and daughter had her describe her surroundings to guide her back home.  OT will relay this concern to MD office.  Pt has PT evaluation mid January.  Pt is not currently using an AD and required hand held assist to walk down hall way for therapy visit this day d/t instability.  Pt is in agreement with OT poc, and OT will reach out to family to try and be present for education re: ADL safety/AE recommendations/HEP/disease/condition management strategies.    OT Occupational Profile and History Problem Focused Assessment - Including review of records relating to presenting problem    Occupational performance deficits (Please refer to evaluation for details): ADL's;IADL's    Body Structure / Function / Physical Skills ADL;Coordination;GMC;UE functional use;Balance;Body mechanics;Decreased knowledge of use of DME;Flexibility;IADL;Dexterity;FMC;Strength;Gait;Mobility;ROM    Rehab Potential Good    Clinical Decision Making Several treatment options, min-mod task modification necessary    Comorbidities Affecting Occupational Performance: May have comorbidities impacting occupational performance    Modification or Assistance to Complete Evaluation  No modification of tasks or assist necessary to complete eval    OT Frequency 2x / week    OT Duration 6 weeks    OT Treatment/Interventions Self-care/ADL training;Therapeutic exercise;DME and/or AE instruction;Functional Mobility Training;Balance training;Neuromuscular education;Manual Therapy;Energy  conservation;Passive range of motion;Therapeutic activities;Patient/family education    Consulted and Agree with Plan of Care Patient             Patient will benefit from skilled therapeutic intervention in order to improve the following deficits and impairments:   Body Structure / Function / Physical Skills: ADL, Coordination, GMC, UE functional use, Balance, Body mechanics, Decreased knowledge of use of DME, Flexibility, IADL, Dexterity, FMC, Strength, Gait, Mobility, ROM       Visit Diagnosis: Other lack of coordination  Muscle weakness (generalized)  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, psychotic disturbance, mood disturbance, or anxiety (HCC)    Problem List 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, psychotic disturbance, mood disturbance, or anxiety (HCC) 11/22/2021   TIA (transient ischemic attack) 09/05/2021   Stomatitis and mucositis 07/21/2021   Cheilitis 07/21/2021   Callus of foot 01/19/2021   Pes planus 01/19/2021   PTTD (posterior tibial tendon dysfunction) 01/19/2021   Acute flank pain 12/15/2020   Cystitis 12/15/2020   Urinary symptom or sign 12/15/2020   Low back pain radiating to right lower extremity 06/23/2020   Gastroesophageal reflux disease 04/28/2020   Large bowel obstruction ([place]) 04/17/2019   Acquired trigger finger 03/20/2019   Iron deficiency anemia 11/21/2018   Syncope 03/18/2018   Anemia 03/18/2018   Exophthalmos 02/22/2018   Multiple thyroid nodules 02/22/2018   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   Pain at surgical site 08/30/2016   Ventral incisional hernia 08/21/2016   SBO (small bowel obstruction) (HCC)    Partial small bowel obstruction ([place]) 04/23/2016   Adjustment disorder with mixed anxiety and depressed mood 04/23/2016   Memory difficulties 09/01/2015   Arthritis 06/11/2015   Back pain, chronic 06/11/2015   HLD (hyperlipidemia) 06/11/2015    Cannot sleep 06/11/2015   L-S  radiculopathy 06/11/2015   Arthritis of knee, degenerative 06/11/2015   B12 deficiency 06/11/2015   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

## 2021-11-30 NOTE — Progress Notes (Signed)
While patient was being prepped for EGD in 09/08/21 she had a stroke.

## 2021-11-30 NOTE — Progress Notes (Signed)
[facility]  [phone]) [phone] Fax:(336) [phone]  ID: Julie Acevedo OB: January 26, 1951  MR#: [id number]  ZYY#:[id number]  Patient Care Team: Jerrol Banana., MD as PCP - General (Family Medicine) Yolonda Kida, MD as PCP - Cardiology (Cardiology) Lorelee Cover., MD as Consulting Physician (Ophthalmology) Bjorn Loser, MD as Consulting Physician (Urology) Bary Castilla, Forest Gleason, MD as Consulting Physician (General Surgery) Jules Husbands, MD as Consulting Physician (General Surgery) Gardiner Barefoot, DPM as Consulting Physician (Podiatry) Gae Dry, MD as Referring Physician (Obstetrics and Gynecology) Vladimir Crofts, MD as Consulting Physician (Neurology) Randon Goldsmith, OT as Occupational Therapist (Occupational Therapy)   CHIEF COMPLAINT: Iron deficiency anemia.  INTERVAL HISTORY: Patient returns to clinic today for repeat laboratory work, further evaluation, and consideration of additional IV iron.  Received 1 dose of IV Venofer on 06/02/2021.  Admits to some shortness of breath with exertion and fatigue with activity but overall feels stable.  Denies any bleeding.  Has some underlying dementia and has developed some tremors.  She will follow-up with her PCP tomorrow regarding this.  REVIEW OF SYSTEMS:   Review of Systems  Constitutional:  Positive for malaise/fatigue.  Respiratory:  Positive for shortness of breath.   Neurological:  Positive for tremors, sensory change and weakness.  Psychiatric/Behavioral:  The patient is nervous/anxious.    As per HPI. Otherwise, a complete review of systems is negative.  PAST MEDICAL HISTORY: Past Medical History:  Diagnosis Date   Anemia    Arthritis    COVID-19 11/19/2019   Diverticulitis    DM (diabetes mellitus) ([place])    typ e 2   History of methicillin resistant staphylococcus aureus (MRSA) 2017   HTN (hypertension)    Hyperlipidemia    Memory difficulties 09/01/2015   Multiple  thyroid nodules    Myocardial infarction ([place]) 1995   Short-term memory loss    Thyroid nodule     PAST SURGICAL HISTORY: Past Surgical History:  Procedure Laterality Date   ABDOMINAL HYSTERECTOMY     due to endometriosis-1 ovary left   BREAST EXCISIONAL BIOPSY Right 1971   neg   CARDIAC CATHETERIZATION  2000   no significant CAD per note of Dr Caryl Comes in 2013    [place] N/A 03/19/2017   Procedure: ANTERIOR REPAIR (CYSTOCELE);  Surgeon: Bjorn Loser, MD;  Location: [facility];  Service: Urology;  Laterality: N/A;   CYSTOSCOPY N/A 03/19/2017   Procedure: CYSTOSCOPY;  Surgeon: Bjorn Loser, MD;  Location: [facility];  Service: Urology;  Laterality: N/A;   EYE SURGERY     FOOT SURGERY     HAND SURGERY     HEMICOLECTOMY     INSERTION OF MESH N/A 08/21/2016   Procedure: INSERTION OF MESH;  Surgeon: Excell Seltzer, MD;  Location: [facility];  Service: General;  Laterality: N/A;   INSERTION OF MESH N/A 12/08/2019   Procedure: INSERTION OF MESH;  Surgeon: Jules Husbands, MD;  Location: [facility];  Service: General;  Laterality: N/A;   LAPAROSCOPIC LYSIS OF ADHESIONS N/A 08/21/2016   Procedure: LAPAROSCOPIC LYSIS OF ADHESIONS;  Surgeon: Excell Seltzer, MD;  Location: [facility];  Service: General;  Laterality: N/A;   LAPAROSCOPIC REMOVAL ABDOMINAL MASS     LAPAROTOMY N/A 04/16/2019   Procedure: EXPLORATORY LAPAROTOMY;  Surgeon: Benjamine Sprague, DO;  Location: [facility];  Service: General;  Laterality: N/A;   LOOP RECORDER INSERTION N/A 03/21/2018   Procedure: LOOP RECORDER INSERTION;  Surgeon: Croitoru,  Dani Gobble, MD;  Location: [facility];  Service: Cardiovascular;  Laterality: N/A;   LUMBAR DISC SURGERY     ROUX-EN-Y GASTRIC BYPASS  2010   Dr Hassell Done   SMALL INTESTINE SURGERY     TONSILLECTOMY     UPPER GI ENDOSCOPY  01/12/2016   normal larynx, normal esophagus. gastric bypass with a normal-sized pouch and intact staple line, normal examined jejunum, otherwise  exam was normal   VENTRAL HERNIA REPAIR N/A 08/21/2016   Procedure: LAPAROSCOPIC VENTRAL HERNIA;  Surgeon: Excell Seltzer, MD;  Location: [facility];  Service: General;  Laterality: N/A;   XI ROBOTIC ASSISTED VENTRAL HERNIA N/A 12/08/2019   Procedure: XI ROBOTIC ASSISTED VENTRAL HERNIA;  Surgeon: Jules Husbands, MD;  Location: [facility];  Service: General;  Laterality: N/A;    FAMILY HISTORY: Family History  Problem Relation Age of Onset   Cancer Father 33       Lung   Coronary artery disease Father 13   COPD Mother    Diabetes Mother    Hypertension Mother    Cancer Paternal Grandmother        Breast   Breast cancer Paternal Grandmother    Congestive Heart Failure Maternal Grandmother    Emphysema Maternal Grandfather    Heart attack Paternal Grandfather    Cancer Paternal Aunt        Breast   Breast cancer Paternal Aunt 34   Breast cancer Paternal Aunt 46    ADVANCED DIRECTIVES (Y/N):  N  HEALTH MAINTENANCE: Social History   Tobacco Use   Smoking status: Never   Smokeless tobacco: Never  Vaping Use   Vaping Use: Never used  Substance Use Topics   Alcohol use: No   Drug use: No     Colonoscopy:  PAP:  Bone density:  Lipid panel:  Allergies  Allergen Reactions   Lotrel [Amlodipine Besy-Benazepril Hcl] Anaphylaxis   Contrast Media [Iodinated Diagnostic Agents] Swelling and Other (See Comments)    Reaction:  Neck swelling    Niacin Hives   Sulfa Antibiotics Hives    Current Outpatient Medications  Medication Sig Dispense Refill   aspirin EC 81 MG EC tablet Take 1 tablet (81 mg total) by mouth daily. Swallow whole. 30 tablet 11   atorvastatin (LIPITOR) 40 MG tablet Take 1 tablet (40 mg total) by mouth daily. 30 tablet 2   calcitRIOL (ROCALTROL) 0.25 MCG capsule Take 0.25 mcg by mouth daily.     cyclobenzaprine (FLEXERIL) 5 MG tablet TAKE 1 TABLET BY MOUTH THREE TIMES A DAY AS NEEDED 60 tablet 3   gabapentin (NEURONTIN) 300 MG capsule Take 1 capsule (300 mg  total) by mouth 3 (three) times daily. 180 capsule 3   hydrOXYzine (VISTARIL) 100 MG capsule Take 1 capsule (100 mg total) by mouth 3 (three) times daily. 90 capsule 9   LINZESS 290 MCG CAPS capsule TAKE 1 CAPSULE BY MOUTH EVERY DAY BEFORE BREAKFAST 90 capsule 0   Melatonin 10 MG CAPS Take 10 mg by mouth at bedtime as needed.     metFORMIN (GLUCOPHAGE) 500 MG tablet TAKE 1 TABLET (500 MG TOTAL) BY MOUTH 2 (TWO) TIMES DAILY WITH A MEAL. 180 tablet 3   metoprolol succinate (TOPROL-XL) 25 MG 24 hr tablet TAKE 1 TABLET (25 MG TOTAL) BY MOUTH DAILY. 90 tablet 1   ondansetron (ZOFRAN) 4 MG tablet TAKE 1 TABLET BY MOUTH EVERY 8 HOURS AS NEEDED 20 tablet 0   rivastigmine (EXELON) 3 MG capsule Take 3  mg by mouth 2 (two) times daily.     traMADol (ULTRAM) 50 MG tablet Take 1 tablet (50 mg total) by mouth every 6 (six) hours as needed. 100 tablet 2   traZODone (DESYREL) 50 MG tablet TAKE 1 TABLET (50 MG TOTAL) BY MOUTH AT BEDTIME AS NEEDED. 90 tablet 4   triamcinolone ointment (KENALOG) 0.5 % APPLY TO AFFECTED AREA TWICE A DAY 30 g 0   No current facility-administered medications for this visit.    OBJECTIVE: Vitals:   11/30/21 1321  BP: 114/66  Pulse: 64  Resp: 16  Temp: (!) 96 F (35.6 C)     Body mass index is 22.39 kg/m.    ECOG FS:0 - Asymptomatic  Physical Exam Constitutional:      Appearance: Normal appearance.  HENT:     Head: Normocephalic and atraumatic.  Eyes:     Pupils: Pupils are equal, round, and reactive to light.  Cardiovascular:     Rate and Rhythm: Normal rate and regular rhythm.     Heart sounds: Normal heart sounds. No murmur heard. Pulmonary:     Effort: Pulmonary effort is normal.     Breath sounds: Normal breath sounds. No wheezing.  Abdominal:     General: Bowel sounds are normal. There is no distension.     Palpations: Abdomen is soft.     Tenderness: There is no abdominal tenderness.  Musculoskeletal:        General: Normal range of motion.     Cervical  back: Normal range of motion.  Skin:    General: Skin is warm and dry.     Findings: No rash.  Neurological:     Mental Status: She is alert and oriented to person, place, and time.  Psychiatric:        Judgment: Judgment normal.    LAB RESULTS:  Lab Results  Component Value Date   NA 141 09/13/2021   K 4.4 09/13/2021   CL 102 09/13/2021   CO2 26 09/13/2021   GLUCOSE 72 09/13/2021   BUN 13 09/13/2021   CREATININE 1.42 (H) 09/13/2021   CALCIUM 8.9 09/13/2021   PROT 6.4 (L) 09/05/2021   ALBUMIN 3.9 09/13/2021   AST 17 09/05/2021   ALT 7 09/05/2021   ALKPHOS 152 (H) 09/05/2021   BILITOT 0.6 09/05/2021   GFRNONAA 52 (L) 09/07/2021   GFRAA 42 (L) 12/05/2020    Lab Results  Component Value Date   WBC 7.3 11/30/2021   NEUTROABS 4.7 11/30/2021   HGB 10.5 (L) 11/30/2021   HCT 33.3 (L) 11/30/2021   MCV 91.2 11/30/2021   PLT 285 11/30/2021    Lab Results  Component Value Date   IRON 75 06/02/2021   TIBC 218 (L) 06/02/2021   IRONPCTSAT 34 (H) 06/02/2021   Lab Results  Component Value Date   FERRITIN 299 06/02/2021    STUDIES: No results found.   ASSESSMENT: Iron deficiency anemia.  PLAN:   1.  Iron deficiency anemia-  Reports developing anemia back in 2018 when she was diagnosed with vaginal vault prolapse cystocele and had a hemoglobin that was maintaining between 8 and 9.  Work-up included colonoscopy and labs which were all essentially normal.  No identifiable cause for her anemia.  She receives intermittent IV iron last in June 2022.  Labs from today show hemoglobin of 10.5 which is a decline from previous.  Iron levels are pending.  Proceed with IV iron today and 1 additional dose next week.  Return  to clinic in 3 months for lab work only and in 6 months for labs and see NP plus or minus a iron infusion.  2.  History of [name]- Followed by neurology.  More recently has developed tremors.  We will follow-up with PCP tomorrow.  Disposition- Iron  today followed by 1 additional dose.  We will add additional if iron levels are significantly low. RTC in 3 months for lab work only and in 6 months for labs see MD and possible IV iron.  I spent 25 minutes dedicated to the care of this patient (face-to-face and non-face-to-face) on the date of the encounter to include what is described in the assessment and plan.  Faythe Casa, NP 11/30/2021 2:38 PM

## 2021-11-30 NOTE — Progress Notes (Signed)
1416- Unable to obtain peripheral IV access today after 3 attempts by staff. Patient would like to reschedule Venofer infusion to another date and reports she will drink more water prior to next appointment. NP, Faythe Casa, notified and aware. Per NP order: okay to reschedule Venofer appointment; patient provided an updated print out of upcoming appointments.

## 2021-12-01 ENCOUNTER — Telehealth: Payer: Self-pay | Admitting: *Deleted

## 2021-12-01 ENCOUNTER — Ambulatory Visit: Payer: Medicare Other

## 2021-12-01 DIAGNOSIS — R29898 Other symptoms and signs involving the musculoskeletal system: Secondary | ICD-10-CM | POA: Diagnosis not present

## 2021-12-01 DIAGNOSIS — R569 Unspecified convulsions: Secondary | ICD-10-CM | POA: Diagnosis not present

## 2021-12-01 DIAGNOSIS — Z8673 Personal history of transient ischemic attack (TIA), and cerebral infarction without residual deficits: Secondary | ICD-10-CM | POA: Diagnosis not present

## 2021-12-01 DIAGNOSIS — R296 Repeated falls: Secondary | ICD-10-CM | POA: Diagnosis not present

## 2021-12-01 NOTE — Chronic Care Management (AMB) (Signed)
°  Chronic Care Management   Outreach Note  12/01/2021 Name: Julie Acevedo MRN: [id number] DOB: 12-02-1951  Julie Acevedo is a 70 y.o. year old female who is a primary care patient of Julie Acevedo., MD. I reached out to Julie Acevedo by phone today in response to a referral sent by Julie Acevedo's primary care provider.  An unsuccessful telephone outreach was attempted today. The patient was referred to the case management team for assistance with care management and care coordination.   Follow Up Plan: The care management team will reach out to the patient again over the next 3 days.  If patient returns call to provider office, please advise to call Embedded Care Management Care Guide Julie Acevedo at [place], [place] [facility]  Direct Dial: [phone]

## 2021-12-04 ENCOUNTER — Ambulatory Visit: Payer: Medicare Other

## 2021-12-04 NOTE — Chronic Care Management (AMB) (Signed)
°  Chronic Care Management   Note  12/04/2021 Name: CHEYENNE BORDEAUX MRN: [id number] DOB: 1951/04/15  SHANASIA IBRAHIM is a 70 y.o. year old female who is a primary care patient of Jerrol Banana., MD. NOLLIE SHIFLETT is currently enrolled in care management services. An additional referral for RNCM was placed.   Follow up plan: Telephone appointment with care management team member scheduled for: 12/12/2021  Julian Hy, [place] [facility]  Direct Dial: [phone]

## 2021-12-05 ENCOUNTER — Ambulatory Visit
Admission: RE | Admit: 2021-12-05 | Discharge: 2021-12-05 | Disposition: A | Discharge status: Home / Self Care | Payer: Medicare Other | Source: Ambulatory Visit | Attending: Family Medicine | Admitting: Family Medicine

## 2021-12-05 ENCOUNTER — Other Ambulatory Visit: Payer: Self-pay | Admitting: Occupational Therapy

## 2021-12-05 ENCOUNTER — Other Ambulatory Visit: Payer: Self-pay

## 2021-12-05 DIAGNOSIS — Z1231 Encounter for screening mammogram for malignant neoplasm of breast: Secondary | ICD-10-CM | POA: Diagnosis not present

## 2021-12-05 NOTE — Patient Instructions (Addendum)
She would like to feel safer in bathroom (grab bars, hand held shower, tub seat all would help this). Julie Acevedo does not feel that she needs a tub seat--she said that if she needs to start taking a shower, instead of a tub bath, she will use her husbands walk in shower and tub bench (husband now has a tub seat with back and Julie Acevedo has grab bars in her tub and next to her toilet)  ACTION PLANNING - BATHING Target Problem Area: Decreased safety in tub/shower  Why Problem May Occur: Decreased balance and mobility  Target Goal: Safety with showering   STRATEGIES Saving Your Energy: DO: DON'T:   Stand while bathing, it uses more energy  Take your time [employer] all items you'll need within easy reach          Modifying your home environment and making it safe: DO: DON'T:  Place rubber mat on floor of tub Try and stand without a mat  Make sure the bathroom is well lit   Use grab bars in shower       Practice It is important to practice the strategies so we can determine if they will be effective in helping to reach your goal. Follow these specific recommendations: Use new grab bars to help you get into and out of shower Use grab bars when standing.   Julie Acevedo, OTR/L     12/05/2021  She would like to feel more safe with getting into and out of her house (handrails and possibly making steps not as high at two doors). She now has a platform and steps with rails at her carport door and at her back door.  ACTION PLANNING - FUNCTIONAL MOBILITY  Target Problem Area: Safety up and down outside the house  Why Problem May Occur: Decreased balance Weakness  Target Goal: Feeling safer up and down steps and preventing falls   STRATEGIES Saving Your Energy: DO: DON'T:  Take standing rest breaks  Don't rush    Modifying your home environment and making it safe: DO: DON'T:  Use the new rails outside going up and down the steps Try going up and down steps without using rail(s)   Go up or down one step at a time if this seems easier and safer to you. Try going up and down steps in alternating foot pattern  Good ideas to take items from upstairs/downstairs up and down the steps in bookbag or shoulder bag so both hands are free Don't carry items in your hands as you are going up and down the steps  Make sure the lighting at steps is on when going up and down steps outside    Practice It is important to practice the strategies so we can determine if they will be effective in helping to reach your goal. Follow these specific recommendations: 1. Use the handrails all the time with the steps 2.Don't try to carry anything in your hands when going up and down the steps  If a strategy does not work the first time, try it again and again (and maybe again).  Julie Acevedo, OTR/L     12/05/2021

## 2021-12-05 NOTE — Patient Outreach (Signed)
[facility] Program  OT FINAL Visit  12/05/2021  JOBETH PANGILINAN 1951-08-10 [id number]  Visit:  4- Fourth Visit  Start Time:  5102 End Time:  1620 Total Minutes:  25  Readiness to Change:  Readiness to Change Score: 9.33   Goals:  Goals Addressed             This Visit's Progress    COMPLETED: Patient Stated       She would like to feel safer in bathroom (grab bars, hand held shower, tub seat all would help this). Ms. Essner does not feel that she needs a tub seat--she said that if she needs to start taking a shower, instead of a tub bath, she will use her husbands walk in shower and tub bench (husband now has a tub seat with back and Ms Tye has grab bars in her tub and next to her toilet)  ACTION PLANNING - BATHING Target Problem Area: Decreased safety in tub/shower  Why Problem May Occur: Decreased balance and mobility  Target Goal: Safety with showering   STRATEGIES Saving Your Energy: DO: DON'T:   Stand while bathing, it uses more energy  Take your time [employer] all items you'll need within easy reach         Modifying your home environment and making it safe: DO: DON'T:  Place rubber mat on floor of tub Try and stand without a mat  Make sure the bathroom is well lit   Use grab bars in shower      Practice It is important to practice the strategies so we can determine if they will be effective in helping to reach your goal. Follow these specific recommendations: Use new grab bars to help you get into and out of shower Use grab bars when standing.   Golden Circle, OTR/L     12/05/2021     COMPLETED: Patient Stated       She would like to feel more safe with getting into and out of her house (handrails and possibly making steps not as high at two doors). She now has a platform and steps with rails at her carport door and at her back door.  ACTION PLANNING - FUNCTIONAL MOBILITY  Target Problem Area: Safety up and down outside the house  Why  Problem May Occur: Decreased balance Weakness  Target Goal: Feeling safer up and down steps and preventing falls   STRATEGIES Saving Your Energy: DO: DON'T:  Take standing rest breaks  Don't rush   Modifying your home environment and making it safe: DO: DON'T:  Use the new rails outside going up and down the steps Try going up and down steps without using rail(s)  Go up or down one step at a time if this seems easier and safer to you. Try going up and down steps in alternating foot pattern  Good ideas to take items from upstairs/downstairs up and down the steps in bookbag or shoulder bag so both hands are free Don't carry items in your hands as you are going up and down the steps  Make sure the lighting at steps is on when going up and down steps outside   Practice It is important to practice the strategies so we can determine if they will be effective in helping to reach your goal. Follow these specific recommendations: 1. Use the handrails all the time with the steps 2.Don't try to carry anything in your hands when going up and down the steps  If a strategy does not work the first time, try it again and again (and maybe again).  Golden Circle, OTR/L     12/05/2021        Post Clinical Reasoning: Client Action (Goal) One Interventions: Pt now has grab bars in her tub and next to her toilet allowing her to get in and out of tub safer/easier and on/off toilet safer and easier. Did Client Try?: Yes Targeted Problem Area Status: A Lot Better Client Action (Goal) Two Interventions: Pt now has a small deck area ans steps out 2 of the 3 entrances/exits to her house Did Client Try?: Yes Targeted Problem Area Status: A Lot Better Clinician View Of Client Situation:: Mrs. Maharaj is getting along okay. She is now having the "jerking" spells where she is standing still and her body just jerks--she reports her neurologist told her she has [name] but the PA from same office says  she does not. I recommended she perhaps gets a 2nd opinion from another neurologist. She stil manages well forself despite all of her medical issues. Client View Of His/Her Situation:: Mrs. Adeyemi is very pleased with all the modifications that have been completed and is very appreciative. Next Visit Plan:: This was our last visit.  Golden Circle, OTR/L Aging [facility]fully [phone]

## 2021-12-06 ENCOUNTER — Other Ambulatory Visit: Payer: Self-pay | Admitting: Family Medicine

## 2021-12-06 ENCOUNTER — Ambulatory Visit: Payer: Medicare Other

## 2021-12-06 DIAGNOSIS — M6281 Muscle weakness (generalized): Secondary | ICD-10-CM | POA: Diagnosis not present

## 2021-12-06 DIAGNOSIS — R278 Other lack of coordination: Secondary | ICD-10-CM

## 2021-12-06 DIAGNOSIS — R928 Other abnormal and inconclusive findings on diagnostic imaging of breast: Secondary | ICD-10-CM

## 2021-12-06 DIAGNOSIS — F02A0 Dementia in other diseases classified elsewhere, mild, without behavioral disturbance, psychotic disturbance, mood disturbance, and anxiety: Secondary | ICD-10-CM | POA: Diagnosis not present

## 2021-12-06 DIAGNOSIS — G3183 Dementia with Lewy bodies: Secondary | ICD-10-CM

## 2021-12-06 DIAGNOSIS — R921 Mammographic calcification found on diagnostic imaging of breast: Secondary | ICD-10-CM

## 2021-12-07 ENCOUNTER — Other Ambulatory Visit: Payer: Self-pay | Admitting: Physician Assistant

## 2021-12-07 ENCOUNTER — Telehealth: Payer: Self-pay

## 2021-12-07 ENCOUNTER — Other Ambulatory Visit: Payer: Self-pay

## 2021-12-07 ENCOUNTER — Inpatient Hospital Stay: Payer: Medicare Other

## 2021-12-07 VITALS — BP 115/62 | HR 65 | Temp 96.7°F | Resp 16

## 2021-12-07 DIAGNOSIS — R928 Other abnormal and inconclusive findings on diagnostic imaging of breast: Secondary | ICD-10-CM

## 2021-12-07 DIAGNOSIS — D509 Iron deficiency anemia, unspecified: Secondary | ICD-10-CM

## 2021-12-07 DIAGNOSIS — Z7982 Long term (current) use of aspirin: Secondary | ICD-10-CM | POA: Diagnosis not present

## 2021-12-07 DIAGNOSIS — Z79899 Other long term (current) drug therapy: Secondary | ICD-10-CM | POA: Diagnosis not present

## 2021-12-07 MED ORDER — SODIUM CHLORIDE 0.9 % IV SOLN
Freq: Once | INTRAVENOUS | Status: AC
  Filled 2021-12-07: qty 250

## 2021-12-07 MED ORDER — IRON SUCROSE 20 MG/ML IV SOLN
200.0000 mg | Freq: Once | INTRAVENOUS | Status: AC
  Administered 2021-12-07: 14:00:00 200 mg via INTRAVENOUS
  Filled 2021-12-07: qty 10

## 2021-12-07 MED ORDER — SODIUM CHLORIDE 0.9 % IV SOLN: 200.0000 mg | Freq: Once | INTRAVENOUS | Status: DC

## 2021-12-07 NOTE — Therapy (Signed)
[place] MAIN [facility] SERVICES [address] [place], [place], [address] Phone: [phone]   Fax:  [phone]  Occupational Therapy Treatment  Patient Details  Name: Julie Acevedo MRN: [id number] Date of Birth: Aug 22, 1951 Referring Provider (OT): Mikey Kirschner, [place]   Encounter Date: 12/06/2021   OT End of Session - 12/07/21 0818     Visit Number 2    Number of Visits 12    Date for OT Re-Evaluation 01/09/22    Authorization Type Reporting period beginning 11/29/2021    OT Start Time 1102    OT Stop Time 1144    OT Time Calculation (min) 42 min    Activity Tolerance Patient tolerated treatment well    Behavior During Therapy [facility] for tasks assessed/performed             Past Medical History:  Diagnosis Date   Anemia    Arthritis    COVID-19 11/19/2019   Diverticulitis    DM (diabetes mellitus) ([place])    typ e 2   History of methicillin resistant staphylococcus aureus (MRSA) 2017   HTN (hypertension)    Hyperlipidemia    Memory difficulties 09/01/2015   Multiple thyroid nodules    Myocardial infarction ([place]) 1995   Short-term memory loss    Thyroid nodule     Past Surgical History:  Procedure Laterality Date   ABDOMINAL HYSTERECTOMY     due to endometriosis-1 ovary left   BREAST EXCISIONAL BIOPSY Right 1971   neg   CARDIAC CATHETERIZATION  2000   no significant CAD per note of Dr Caryl Comes in 2013    [place] N/A 03/19/2017   Procedure: ANTERIOR REPAIR (CYSTOCELE);  Surgeon: Bjorn Loser, MD;  Location: [facility];  Service: Urology;  Laterality: N/A;   CYSTOSCOPY N/A 03/19/2017   Procedure: CYSTOSCOPY;  Surgeon: Bjorn Loser, MD;  Location: [facility];  Service: Urology;  Laterality: N/A;   EYE SURGERY     FOOT SURGERY     HAND SURGERY     HEMICOLECTOMY     INSERTION OF MESH N/A 08/21/2016   Procedure: INSERTION OF MESH;  Surgeon: Excell Seltzer, MD;  Location: [facility];  Service: General;   Laterality: N/A;   INSERTION OF MESH N/A 12/08/2019   Procedure: INSERTION OF MESH;  Surgeon: Jules Husbands, MD;  Location: [facility];  Service: General;  Laterality: N/A;   LAPAROSCOPIC LYSIS OF ADHESIONS N/A 08/21/2016   Procedure: LAPAROSCOPIC LYSIS OF ADHESIONS;  Surgeon: Excell Seltzer, MD;  Location: [facility];  Service: General;  Laterality: N/A;   LAPAROSCOPIC REMOVAL ABDOMINAL MASS     LAPAROTOMY N/A 04/16/2019   Procedure: EXPLORATORY LAPAROTOMY;  Surgeon: Benjamine Sprague, DO;  Location: [facility];  Service: General;  Laterality: N/A;   LOOP RECORDER INSERTION N/A 03/21/2018   Procedure: LOOP RECORDER INSERTION;  Surgeon: Sanda Klein, MD;  Location: [place] [facility];  Service: Cardiovascular;  Laterality: N/A;   LUMBAR DISC SURGERY     ROUX-EN-Y GASTRIC BYPASS  2010   Dr Hassell Done   SMALL INTESTINE SURGERY     TONSILLECTOMY     UPPER GI ENDOSCOPY  01/12/2016   normal larynx, normal esophagus. gastric bypass with a normal-sized pouch and intact staple line, normal examined jejunum, otherwise exam was normal   VENTRAL HERNIA REPAIR N/A 08/21/2016   Procedure: LAPAROSCOPIC VENTRAL HERNIA;  Surgeon: Excell Seltzer, MD;  Location: [facility];  Service: General;  Laterality: N/A;   XI  ROBOTIC ASSISTED VENTRAL HERNIA N/A 12/08/2019   Procedure: XI ROBOTIC ASSISTED VENTRAL HERNIA;  Surgeon: Jules Husbands, MD;  Location: [facility];  Service: General;  Laterality: N/A;    There were no vitals filed for this visit.   Subjective Assessment - 12/06/21 0812     Subjective  "I have a lot of baking to do this week."    Pertinent History [name], UB/LB tremors, frequent falls    Limitations STM impairment, reports frequent hallucinations, UB/LB tremors, frequent falls    Patient Stated Goals Pt reports she wishes she didn't have tremors but is receptive to strategies to help manage self care tasks with tremors present.    Currently in Pain? No/denies    Pain Score 0-No pain            Occupational Therapy Treatment: Self Care: Educated on EC strategies for meal prep/baking.  Encouraged pt to space activities out throughout the week, place chair in kitchen near work space to sit when able.  Reinforced strategies to manage WB during self feeding, including using mug/cup with large handle and lid to reduce spilling.  Encouraged pt practice WB on forearms when bringing silverware to mouth, and scoot chair close to table, plate close to body to reduce distance that pt must bring food from plate to mouth.  Pt receptive to all strategies.    Therapeutic Exercise: Facilitated hand strengthening with use of hand gripper set at 11.2# to remove jumbo pegs from pegboard x3 trials using R/L hands.  Used 1# dowel (attempted 1.5 but pt felt increased soreness in shoulders) to perform BUE strengthening to target chest press, shoulder flexion, abd, ER, IR and extension behind back, OT providing min vc for body mechanics and technique.    Response to Treatment: Pt receptive to strategies for managing tremors and EC strategies.  Good tolerance to therapeutic exercise when dowel weight was reduced to 1# for BUE strengthening.  Pt will continue to benefit from skilled OT for instruction in ADL strategies and BUE strengthening in order to maximize indep with daily tasks.    OT Education - 12/06/21 0818     Education Details EC and strategies to manage tremors    Person(s) Educated Patient    Methods Explanation;Verbal cues    Comprehension Verbalized understanding;Need further instruction;Verbal cues required              OT Short Term Goals - 11/30/21 0851       OT SHORT TERM GOAL #1   Title Pt will perform HEP for BUE strengthening and coordination training with mod vc/supv from family member.    Baseline Eval: not yet initiated    Time 3    Period Weeks    Status New    Target Date 12/19/21               OT Long Term Goals - 11/30/21 0852       OT LONG TERM  GOAL #1   Title Pt will increase FOTO score to 50 or better to indicate increased functional performance.    Baseline Eval: FOTO 45    Time 6    Period Weeks    Status New    Target Date 01/09/22      OT LONG TERM GOAL #2   Title Pt will improve R shoulder strength by 1 MM grade to ease tolerance for reaching overhead with self care/IADL tasks.    Baseline Eval: R shoulder strength 3-/5  Time 6    Period Weeks    Status New    Target Date 01/09/22      OT LONG TERM GOAL #3   Title Pt will increase R grip strength by 5 or more lbs to increase tolerance for carrying/transporting heavier ADL supplies.    Baseline Eval: R grip 35 lbs (L dominant is 44 lbs)    Time 6    Period Weeks    Status New    Target Date 01/09/22      OT LONG TERM GOAL #4   Title Pt will ulitize compensatory ADL strategies and AE as needed with mod vc to reduce fall risk with self care and functional mobility.    Baseline Eval: Family making handicapped bathroom modifications next week; frequent falls with ADLs    Time 6    Period Weeks    Status New    Target Date 01/09/22      OT LONG TERM GOAL #5   Title Pt will utilize WB strategies as needed with mod vc to manage tremors during self care tasks.    Baseline Eval: not yet initiated education    Time 6    Period Weeks    Status New    Target Date 01/09/22              Plan - 12/06/21 1002     Clinical Impression Statement Pt receptive to strategies for managing tremors and EC strategies.  Good tolerance to therapeutic exercise when dowel weight was reduced to 1# for BUE strengthening.  Pt will continue to benefit from skilled OT for instruction in ADL strategies and BUE strengthening in order to maximize indep with daily tasks.    OT Occupational Profile and History Problem Focused Assessment - Including review of records relating to presenting problem    Occupational performance deficits (Please refer to evaluation for details): ADL's;IADL's     Body Structure / Function / Physical Skills ADL;Coordination;GMC;UE functional use;Balance;Body mechanics;Decreased knowledge of use of DME;Flexibility;IADL;Dexterity;FMC;Strength;Gait;Mobility;ROM    Rehab Potential Good    Clinical Decision Making Several treatment options, min-mod task modification necessary    Comorbidities Affecting Occupational Performance: May have comorbidities impacting occupational performance    Modification or Assistance to Complete Evaluation  No modification of tasks or assist necessary to complete eval    OT Frequency 2x / week    OT Duration 6 weeks    OT Treatment/Interventions Self-care/ADL training;Therapeutic exercise;DME and/or AE instruction;Functional Mobility Training;Balance training;Neuromuscular education;Manual Therapy;Energy conservation;Passive range of motion;Therapeutic activities;Patient/family education    Consulted and Agree with Plan of Care Patient             Patient will benefit from skilled therapeutic intervention in order to improve the following deficits and impairments:   Body Structure / Function / Physical Skills: ADL, Coordination, GMC, UE functional use, Balance, Body mechanics, Decreased knowledge of use of DME, Flexibility, IADL, Dexterity, FMC, Strength, Gait, Mobility, ROM       Visit Diagnosis: Muscle weakness (generalized)  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, psychotic disturbance, mood disturbance, or anxiety (HCC)  Other lack of coordination    Problem List 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, psychotic disturbance, mood disturbance, or anxiety ([place]) 11/22/2021   TIA (transient ischemic attack) 09/05/2021   Stomatitis and mucositis 07/21/2021   Cheilitis 07/21/2021   Callus of foot 01/19/2021   Pes planus 01/19/2021   PTTD (posterior  tibial tendon dysfunction) 01/19/2021   Acute flank pain 12/15/2020    Cystitis 12/15/2020   Urinary symptom or sign 12/15/2020   Low back pain radiating to right lower extremity 06/23/2020   Gastroesophageal reflux disease 04/28/2020   Large bowel obstruction ([place]) 04/17/2019   Acquired trigger finger 03/20/2019   Iron deficiency anemia 11/21/2018   Syncope 03/18/2018   Anemia 03/18/2018   Exophthalmos 02/22/2018   Multiple thyroid nodules 02/22/2018   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   Pain at surgical site 08/30/2016   Ventral incisional hernia 08/21/2016   SBO (small bowel obstruction) (HCC)    Partial small bowel obstruction ([place]) 04/23/2016   Adjustment disorder with mixed anxiety and depressed mood 04/23/2016   Memory difficulties 09/01/2015   Arthritis 06/11/2015   Back pain, chronic 06/11/2015   HLD (hyperlipidemia) 06/11/2015   Cannot sleep 06/11/2015   L-S radiculopathy 06/11/2015   Arthritis of knee, degenerative 06/11/2015   B12 deficiency 06/11/2015   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

## 2021-12-07 NOTE — Telephone Encounter (Signed)
Copied from [place] [id number]. Topic: General - Other >> Dec 07, 2021  3:47 PM Yvette Rack wrote: Reason for CRM: Pt request call back to discuss mammogram results.

## 2021-12-08 ENCOUNTER — Other Ambulatory Visit: Payer: Self-pay

## 2021-12-08 NOTE — Patient Outreach (Signed)
[facility]  12/08/2021  Julie Acevedo Oct 01, 1951 [id number]   Message from occupational therapist to call patient about concerns.  Placed call to patient who is frustrated due to diagnosis.  Saw PA who stated different diagnosis than MD told patient. Offered patient to be referred to embedded case manager to help her moving forward and she has agreed. Encouraged patient to call MD office and make an appointment  with MD for follow up as well.  [name] RN, BSN, Careers information officer for [employer] Mobile: [phone]

## 2021-12-12 ENCOUNTER — Ambulatory Visit: Payer: Medicare Other | Admitting: Occupational Therapy

## 2021-12-12 ENCOUNTER — Ambulatory Visit (INDEPENDENT_AMBULATORY_CARE_PROVIDER_SITE_OTHER): Payer: Medicare Other

## 2021-12-12 ENCOUNTER — Other Ambulatory Visit: Payer: Self-pay

## 2021-12-12 ENCOUNTER — Encounter: Payer: Self-pay | Admitting: Occupational Therapy

## 2021-12-12 DIAGNOSIS — Z9181 History of falling: Secondary | ICD-10-CM

## 2021-12-12 DIAGNOSIS — E1122 Type 2 diabetes mellitus with diabetic chronic kidney disease: Secondary | ICD-10-CM

## 2021-12-12 DIAGNOSIS — R278 Other lack of coordination: Secondary | ICD-10-CM | POA: Diagnosis not present

## 2021-12-12 DIAGNOSIS — M6281 Muscle weakness (generalized): Secondary | ICD-10-CM

## 2021-12-12 DIAGNOSIS — E782 Mixed hyperlipidemia: Secondary | ICD-10-CM

## 2021-12-12 DIAGNOSIS — F02A0 Dementia in other diseases classified elsewhere, mild, without behavioral disturbance, psychotic disturbance, mood disturbance, and anxiety: Secondary | ICD-10-CM | POA: Diagnosis not present

## 2021-12-12 DIAGNOSIS — I10 Essential (primary) hypertension: Secondary | ICD-10-CM

## 2021-12-12 NOTE — Patient Instructions (Addendum)
Thank you for allowing the Chronic Care Management team to participate in your care. It was great speaking with you today! Please don't hesitate to contact me if I can be of assistance to you before our next scheduled telephone appointment.  Please call the care guide team at [phone] if you need to cancel or reschedule your appointment.    Patient Care Plan: RN Care Management Plan of Care     Problem Identified: DM, HLD, HTN and Fall Risk      Long-Range Goal: Disease Progression Prevented or Minimized   Start Date: 12/12/2021  Expected End Date: 03/12/2022  Priority: High  Note:   Current Barriers:  Chronic Disease Management support and education needs related to HTN, HLD, DMII, and Fall Risk.  RNCM Clinical Goal(s):  Patient will demonstrate ongoing adherence to prescribed treatment plan for HTN, HLD, DMII, and Fall Risk through collaboration with the provider, RN Care manager and the care team.   Interventions: 1:1 collaboration with primary care provider regarding development and update of comprehensive plan of care as evidenced by provider attestation and co-signature Inter-disciplinary care team collaboration (see longitudinal plan of care) Evaluation of current treatment plan related to  self management and patient's adherence to plan as established by provider   Diabetes Interventions:  (Status:  New goal.) Long Term Goal Assessed patient's understanding of A1c goal: <7%  Lab Results  Component Value Date   HGBA1C 5.6 09/06/2021  Reviewed medications and discussed plan for diabetes management. Provided information regarding importance of consistent blood glucose monitoring. Encouraged to monitor and maintain a log. Reviewed s/sx of hypoglycemia and hyperglycemia along with recommended interventions. Discussed nutritional intake and importance of complying with the recommended ADA/modified carb diet.  Discussed and offered referrals for available Diabetes education  classes. Reports doing well with diabetes self-management. A1C is at goal. Declined need for additional resources. Discussed importance of completing recommended DM preventive care. Reports completing footcare as recommended. Eye exam is up to date.   Falls Interventions:  (Status:  New goal.) Long Term Goal Reviewed medications and discussed potential side effects that may increase risk of falls. Provided information regarding safety and fall prevention. Reports using her walker as recommended. Reports having a ramp and needed railing to safely enter and exit the home. Her primary concern is r/t tremors which make it difficult to ambulate and grip items. Notes this started within the last month and has gotten progressively worse. She will follow up to discuss with the Neurology team in February. Discussed plan for Physical Therapy and Occupational Therapy. She is already engaged with the OT team. She is pending start of PT services. Reports that the services will be beneficial. However, she feels that her ability to ambulate will significantly improve if the tremors can be controlled. Discussed ability to perform ADL's and IADL's. Reports her spouse in the home and available to assist if needed. Declined current need for additional in-home assistance but agreed to update the care management team if her functional status declines.    Hyperlipidemia Interventions:  (Status:  New goal.) Long Term Goal Lab Results  Component Value Date   CHOL 134 09/06/2021   HDL 60 09/06/2021   LDLCALC 63 09/06/2021   TRIG 54 09/06/2021   CHOLHDL 2.2 09/06/2021    Medications reviewed  Reviewed provider established cholesterol goals  Discussed importance of regular laboratory monitoring as prescribed Reviewed importance of limiting foods high in cholesterol   Hypertension Interventions:  (Status:  New goal.) Long Term  Goal Last practice recorded BP readings:  BP Readings from Last 3 Encounters:  12/07/21  115/62  11/30/21 114/66  11/22/21 (!) 97/59  Most recent eGFR/CrCl:  Lab Results  Component Value Date   EGFR 40 (L) 09/13/2021    No components found for: CRCL  Reviewed medications and indications for use. Reports taking all medications as prescribed. Provided information regarding established blood pressure parameters along with indications for notifying a provider. Advised to monitor and record readings.  Discussed compliance with recommended cardiac prudent diet. Encouraged to read nutrition labels and avoid highly processed foods when possible. Discussed complications of uncontrolled blood pressure.  Reviewed s/sx of heart attack, stroke and worsening symptoms that require immediate medical attention.   Patient Goals/Self-Care Activities: Take all medications as prescribed Attend all scheduled provider appointments Call pharmacy for medication refills 3-7 days in advance of running out of medications Perform all self care activities independently  Call provider office for new concerns or questions    Follow Up Plan:   Will follow up in two months      Consent to CCM Services: Ms. Troyer was given information about Chronic Care Management services including:  CCM service includes personalized support from designated clinical staff supervised by her physician, including individualized plan of care and coordination with other care providers 24/7 contact phone numbers for assistance for urgent and routine care needs. Service will only be billed when office clinical staff spend 20 minutes or more in a month to coordinate care. Only one practitioner may furnish and bill the service in a calendar month. The patient may stop CCM services at any time (effective at the end of the month) by phone call to the office staff. The patient will be responsible for cost sharing (co-pay) of up to 20% of the service fee (after annual deductible is met).  Patient agreed to services and verbal  consent obtained.   Mrs. Umeda verbalized understanding of the information discussed during the telephonic outreach. Declined need for mailed/printed instructions. A member of the care management team will follow up in two months.   Cristy [facility] Care [facility] [phone]

## 2021-12-12 NOTE — Chronic Care Management (AMB) (Signed)
Chronic Care Management   CCM RN Visit Note  12/12/2021 Name: Julie Acevedo MRN: [id number] DOB: [birth year]  Subjective: Julie Acevedo is a 70 y.o. year old female who is a primary care patient of Jerrol Banana., MD. The care management team was consulted for assistance with disease management and care coordination needs.    Engaged with patient by telephone for initial visit in response to provider referral for case management and care coordination services.   Consent to Services:  The patient was given the following information about Chronic Care Management services: 1. CCM service includes personalized support from designated clinical staff supervised by the primary care provider, including individualized plan of care and coordination with other care providers 2. 24/7 contact phone numbers for assistance for urgent and routine care needs. 3. Service will only be billed when office clinical staff spend 20 minutes or more in a month to coordinate care. 4. Only one practitioner may furnish and bill the service in a calendar month. 5.The patient may stop CCM services at any time (effective at the end of the month) by phone call to the office staff. 6. The patient will be responsible for cost sharing (co-pay) of up to 20% of the service fee (after annual deductible is met). Patient agreed to services and consent obtained.   Assessment: Review of patient past medical history, allergies, medications, health status, including review of consultants reports, laboratory and other test data, was performed as part of comprehensive evaluation and provision of chronic care management services.   SDOH (Social Determinants of Health) assessments and interventions performed:  SDOH Interventions    Flowsheet Row Most Recent Value  SDOH Interventions   Food Insecurity Interventions Intervention Not Indicated  Transportation Interventions Intervention Not Indicated        CCM Care Plan  Allergies   Allergen Reactions   Lotrel [Amlodipine Besy-Benazepril Hcl] Anaphylaxis   Contrast Media [Iodinated Contrast Media] Swelling and Other (See Comments)    Reaction:  Neck swelling    Niacin Hives   Sulfa Antibiotics Hives    Outpatient Encounter Medications as of 12/12/2021  Medication Sig   aspirin EC 81 MG EC tablet Take 1 tablet (81 mg total) by mouth daily. Swallow whole.   atorvastatin (LIPITOR) 40 MG tablet Take 1 tablet (40 mg total) by mouth daily.   calcitRIOL (ROCALTROL) 0.25 MCG capsule Take 0.25 mcg by mouth daily.   cyclobenzaprine (FLEXERIL) 5 MG tablet TAKE 1 TABLET BY MOUTH THREE TIMES A DAY AS NEEDED   gabapentin (NEURONTIN) 300 MG capsule Take 1 capsule (300 mg total) by mouth 3 (three) times daily.   hydrOXYzine (VISTARIL) 100 MG capsule Take 1 capsule (100 mg total) by mouth 3 (three) times daily.   LINZESS 290 MCG CAPS capsule TAKE 1 CAPSULE BY MOUTH EVERY DAY BEFORE BREAKFAST   metFORMIN (GLUCOPHAGE) 500 MG tablet TAKE 1 TABLET (500 MG TOTAL) BY MOUTH 2 (TWO) TIMES DAILY WITH A MEAL.   metoprolol succinate (TOPROL-XL) 25 MG 24 hr tablet TAKE 1 TABLET (25 MG TOTAL) BY MOUTH DAILY.   ondansetron (ZOFRAN) 4 MG tablet TAKE 1 TABLET BY MOUTH EVERY 8 HOURS AS NEEDED   rivastigmine (EXELON) 3 MG capsule Take 3 mg by mouth 2 (two) times daily.   traMADol (ULTRAM) 50 MG tablet Take 1 tablet (50 mg total) by mouth every 6 (six) hours as needed.   traZODone (DESYREL) 50 MG tablet TAKE 1 TABLET (50 MG TOTAL) BY MOUTH AT BEDTIME AS  NEEDED.   triamcinolone ointment (KENALOG) 0.5 % APPLY TO AFFECTED AREA TWICE A DAY   Melatonin 10 MG CAPS Take 10 mg by mouth at bedtime as needed. (Patient not taking: Reported on 12/12/2021)   No facility-administered encounter medications on file as of 12/12/2021.    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, psychotic disturbance, mood disturbance, or anxiety ([place]) 11/22/2021    TIA (transient ischemic attack) 09/05/2021   Stomatitis and mucositis 07/21/2021   Cheilitis 07/21/2021   Callus of foot 01/19/2021   Pes planus 01/19/2021   PTTD (posterior tibial tendon dysfunction) 01/19/2021   Acute flank pain 12/15/2020   Cystitis 12/15/2020   Urinary symptom or sign 12/15/2020   Low back pain radiating to right lower extremity 06/23/2020   Gastroesophageal reflux disease 04/28/2020   Large bowel obstruction ([place]) 04/17/2019   Acquired trigger finger 03/20/2019   Iron deficiency anemia 11/21/2018   Syncope 03/18/2018   Anemia 03/18/2018   Exophthalmos 02/22/2018   Multiple thyroid nodules 02/22/2018   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   Pain at surgical site 08/30/2016   Ventral incisional hernia 08/21/2016   SBO (small bowel obstruction) (HCC)    Partial small bowel obstruction ([place]) 04/23/2016   Adjustment disorder with mixed anxiety and depressed mood 04/23/2016   Memory difficulties 09/01/2015   Arthritis 06/11/2015   Back pain, chronic 06/11/2015   HLD (hyperlipidemia) 06/11/2015   Cannot sleep 06/11/2015   L-S radiculopathy 06/11/2015   Arthritis of knee, degenerative 06/11/2015   B12 deficiency 06/11/2015   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  RNCM Clinical Goal(s):  Patient will demonstrate ongoing adherence to prescribed  treatment plan for HTN, HLD, DMII, and Fall Risk through collaboration with the provider, RN Care manager and the care team.   Interventions: 1:1 collaboration with primary care provider regarding development and update of comprehensive plan of care as evidenced by provider attestation and co-signature Inter-disciplinary care team collaboration (see longitudinal plan of care) Evaluation of current treatment plan related to  self management and patient's adherence to plan as established by provider   Diabetes Interventions:  (Status:  New goal.) Long Term Goal Assessed patient's understanding of A1c goal: <7%  Lab Results  Component Value Date   HGBA1C 5.6 09/06/2021  Reviewed medications and discussed plan for diabetes management. Provided information regarding importance of consistent blood glucose monitoring. Encouraged to monitor and maintain a log. Reviewed s/sx of hypoglycemia and hyperglycemia along with recommended interventions. Discussed nutritional intake and importance of complying with the recommended ADA/modified carb diet.  Discussed and offered referrals for available Diabetes education classes. Reports doing well with diabetes self-management. A1C is at goal. Declined need for additional resources. Discussed importance of completing recommended DM preventive care. Reports completing footcare as recommended. Eye exam is  up to date.   Falls Interventions:  (Status:  New goal.) Long Term Goal Reviewed medications and discussed potential side effects that may increase risk of falls. Provided information regarding safety and fall prevention. Reports using her walker as recommended. Reports having a ramp and needed railing to safely enter and exit the home. Her primary concern is r/t tremors which make it difficult to ambulate and grip items. Notes this started within the last month and has gotten progressively worse. She will follow up to discuss with the Neurology team in  February. Discussed plan for Physical Therapy and Occupational Therapy. She is already engaged with the OT team. She is pending start of PT services. Reports that the services will be beneficial. However, she feels that her ability to ambulate will significantly improve if the tremors can be controlled. Discussed ability to perform ADL's and IADL's. Reports her spouse in the home and available to assist if needed. Declined current need for additional in-home assistance but agreed to update the care management team if her functional status declines.    Hyperlipidemia Interventions:  (Status:  New goal.) Long Term Goal Lab Results  Component Value Date   CHOL 134 09/06/2021   HDL 60 09/06/2021   LDLCALC 63 09/06/2021   TRIG 54 09/06/2021   CHOLHDL 2.2 09/06/2021    Medications reviewed  Reviewed provider established cholesterol goals  Discussed importance of regular laboratory monitoring as prescribed Reviewed importance of limiting foods high in cholesterol   Hypertension Interventions:  (Status:  New goal.) Long Term Goal Last practice recorded BP readings:  BP Readings from Last 3 Encounters:  12/07/21 115/62  11/30/21 114/66  11/22/21 (!) 97/59  Most recent eGFR/CrCl:  Lab Results  Component Value Date   EGFR 40 (L) 09/13/2021    No components found for: CRCL  Reviewed medications and indications for use. Reports taking all medications as prescribed. Provided information regarding established blood pressure parameters along with indications for notifying a provider. Advised to monitor and record readings.  Discussed compliance with recommended cardiac prudent diet. Encouraged to read nutrition labels and avoid highly processed foods when possible. Discussed complications of uncontrolled blood pressure.  Reviewed s/sx of heart attack, stroke and worsening symptoms that require immediate medical attention.   Patient Goals/Self-Care Activities: Take all medications as  prescribed Attend all scheduled provider appointments Call pharmacy for medication refills 3-7 days in advance of running out of medications Perform all self care activities independently  Call provider office for new concerns or questions    Follow Up Plan:   Will follow up in two months       PLAN A member of the care management team will follow up in two months.   Cristy [facility] Care Management [facility] [phone]

## 2021-12-12 NOTE — Therapy (Signed)
[place] MAIN [facility] SERVICES [address] [place], [place], [address] Phone: [phone]   Fax:  [phone]  Occupational Therapy Treatment  Patient Details  Name: Julie Acevedo MRN: [id number] Date of Birth: 1951/08/22 Referring Provider (OT): Mikey Kirschner, [place]   Encounter Date: 12/12/2021   OT End of Session - 12/12/21 1555     Visit Number 3    Number of Visits 12    Date for OT Re-Evaluation 01/09/22    Authorization Type Reporting period beginning 11/29/2021    OT Start Time 1435    OT Stop Time 1515    OT Time Calculation (min) 40 min    Activity Tolerance Patient tolerated treatment well    Behavior During Therapy [facility] for tasks assessed/performed             Past Medical History:  Diagnosis Date   Anemia    Arthritis    COVID-19 11/19/2019   Diverticulitis    DM (diabetes mellitus) ([place])    typ e 2   History of methicillin resistant staphylococcus aureus (MRSA) 2017   HTN (hypertension)    Hyperlipidemia    Memory difficulties 09/01/2015   Multiple thyroid nodules    Myocardial infarction ([place]) 1995   Short-term memory loss    Thyroid nodule     Past Surgical History:  Procedure Laterality Date   ABDOMINAL HYSTERECTOMY     due to endometriosis-1 ovary left   BREAST EXCISIONAL BIOPSY Right 1971   neg   CARDIAC CATHETERIZATION  2000   no significant CAD per note of Dr Caryl Comes in 2013    [place] N/A 03/19/2017   Procedure: ANTERIOR REPAIR (CYSTOCELE);  Surgeon: Bjorn Loser, MD;  Location: [facility];  Service: Urology;  Laterality: N/A;   CYSTOSCOPY N/A 03/19/2017   Procedure: CYSTOSCOPY;  Surgeon: Bjorn Loser, MD;  Location: [facility];  Service: Urology;  Laterality: N/A;   EYE SURGERY     FOOT SURGERY     HAND SURGERY     HEMICOLECTOMY     INSERTION OF MESH N/A 08/21/2016   Procedure: INSERTION OF MESH;  Surgeon: Excell Seltzer, MD;  Location: [facility];  Service: General;   Laterality: N/A;   INSERTION OF MESH N/A 12/08/2019   Procedure: INSERTION OF MESH;  Surgeon: Jules Husbands, MD;  Location: [facility] ORS;  Service: General;  Laterality: N/A;   LAPAROSCOPIC LYSIS OF ADHESIONS N/A 08/21/2016   Procedure: LAPAROSCOPIC LYSIS OF ADHESIONS;  Surgeon: Excell Seltzer, MD;  Location: [facility];  Service: General;  Laterality: N/A;   LAPAROSCOPIC REMOVAL ABDOMINAL MASS     LAPAROTOMY N/A 04/16/2019   Procedure: EXPLORATORY LAPAROTOMY;  Surgeon: Benjamine Sprague, DO;  Location: [facility] OR[facility];  Service: General;  Laterality: N/A;   LOOP RECORDER INSERTION N/A 03/21/2018   Procedure: LOOP RECORDER INSERTION;  Surgeon: Sanda Klein, MD;  Location: [place] CV LAB;  Service: Cardiovascular;  Laterality: N/A;   LUMBAR DISC SURGERY     ROUX-EN-Y GASTRIC BYPASS  2010   Dr Hassell Done   SMALL INTESTINE SURGERY     TONSILLECTOMY     UPPER GI ENDOSCOPY  01/12/2016   normal larynx, normal esophagus. gastric bypass with a normal-sized pouch and intact staple line, normal examined jejunum, otherwise exam was normal   VENTRAL HERNIA REPAIR N/A 08/21/2016   Procedure: LAPAROSCOPIC VENTRAL HERNIA;  Surgeon: Excell Seltzer, MD;  Location: [facility];  Service: General;  Laterality: N/A;   XI  ROBOTIC ASSISTED VENTRAL HERNIA N/A 12/08/2019   Procedure: XI ROBOTIC ASSISTED VENTRAL HERNIA;  Surgeon: Jules Husbands, MD;  Location: [facility] OR[facility];  Service: General;  Laterality: N/A;    There were no vitals filed for this visit.   Subjective Assessment - 12/12/21 1554     Subjective  Pt. reports that she had alot of shaking this weekend, and is tired of it.    Patient is accompanied by: Family member    Pertinent History [name], UB/LB tremors, frequent falls    Limitations STM impairment, reports frequent hallucinations, UB/LB tremors, frequent falls    Patient Stated Goals Pt reports she wishes she didn't have tremors but is receptive to strategies to help manage self care tasks with  tremors present.    Currently in Pain? No/denies            OT TREATMENT    Neuro muscular re-education:  Pt. worked on grasping 1/2" small circular tipped pegs, and controlling movements movements needed for placing them onto a designated target on a small pegboard. Pt. Worked on grasping coins from a tabletop surface, placing them into a resistive container, and pushing them through the slot while isolating his 2nd digit. Pt. worked on moving the coins to the edge of a flat surface isolating her 2nd digit to the thumb to form the grasp in preparation for placing them into a resistive container.   Therapeutic Exercise:  Pt. worked on pinch strengthening in bilateral hands for lateral pinch using yellow, red, green, blue, and black resistive clips. Pt. worked on placing the clips at various vertical and horizontal angles. Tactile and verbal cues were required for eliciting the desired movement.   Upon arrival  pt reported being frustrated with the tremors, and not being able to see her physicians until February, and March. Pt. education was provided about proximal UE stability to encourage distal motor control. Pt. education was provided about adaptive utensils. Pt. education was provided about Dycem to use at home for more stabilization of items when opening, and picking up small objects from tabletop surfaces. Pt. Was able to more easily grasp small beads to simulate pill size objects. Pt. was provided with a swivel spoon, and some Dycem to use at home. Pt. Was able to achieved more distal control with proximal stabilization on armrest, and tabletop surfaces.                          OT Education - 12/12/21 1555     Education Details Adaptive utensils    Person(s) Educated Patient    Methods Explanation;Verbal cues    Comprehension Verbalized understanding;Need further instruction;Verbal cues required              OT Short Term Goals - 11/30/21 0851        OT SHORT TERM GOAL #1   Title Pt will perform HEP for BUE strengthening and coordination training with mod vc/supv from family member.    Baseline Eval: not yet initiated    Time 3    Period Weeks    Status New    Target Date 12/19/21               OT Long Term Goals - 11/30/21 0852       OT LONG TERM GOAL #1   Title Pt will increase FOTO score to 50 or better to indicate increased functional performance.    Baseline Eval: FOTO 45  Time 6    Period Weeks    Status New    Target Date 01/09/22      OT LONG TERM GOAL #2   Title Pt will improve R shoulder strength by 1 MM grade to ease tolerance for reaching overhead with self care/IADL tasks.    Baseline Eval: R shoulder strength 3-/5    Time 6    Period Weeks    Status New    Target Date 01/09/22      OT LONG TERM GOAL #3   Title Pt will increase R grip strength by 5 or more lbs to increase tolerance for carrying/transporting heavier ADL supplies.    Baseline Eval: R grip 35 lbs (L dominant is 44 lbs)    Time 6    Period Weeks    Status New    Target Date 01/09/22      OT LONG TERM GOAL #4   Title Pt will ulitize compensatory ADL strategies and AE as needed with mod vc to reduce fall risk with self care and functional mobility.    Baseline Eval: Family making handicapped bathroom modifications next week; frequent falls with ADLs    Time 6    Period Weeks    Status New    Target Date 01/09/22      OT LONG TERM GOAL #5   Title Pt will utilize WB strategies as needed with mod vc to manage tremors during self care tasks.    Baseline Eval: not yet initiated education    Time 6    Period Weeks    Status New    Target Date 01/09/22                   Plan - 12/12/21 1556     Clinical Impression Statement Upon arrival, pt. reported being frustrated with the tremors, and not being able to see her physicians until February, and March. Pt. education was provided about proximal UE stability to encourage  distal motor control. Pt. education was provided about adaptive utensils. Pt. education was provided about Dycem to use at home for more stabilization of items when opening, and picking up small objects from tabletop surfaces. Pt. Was able to more easily grasp small beads to simulate pill size objects. Pt. was provided with a swivel spoon, and some Dycem to use at home. Pt. Was able to achieved more distal control with proximal stabilization on armrest, and tabletop surfaces.    OT Occupational Profile and History Problem Focused Assessment - Including review of records relating to presenting problem    Occupational performance deficits (Please refer to evaluation for details): ADL's;IADL's    Body Structure / Function / Physical Skills ADL;Coordination;GMC;UE functional use;Balance;Body mechanics;Decreased knowledge of use of DME;Flexibility;IADL;Dexterity;FMC;Strength;Gait;Mobility;ROM    Rehab Potential Good    Clinical Decision Making Several treatment options, min-mod task modification necessary    Comorbidities Affecting Occupational Performance: May have comorbidities impacting occupational performance    Modification or Assistance to Complete Evaluation  No modification of tasks or assist necessary to complete eval    OT Frequency 2x / week    OT Duration 6 weeks    OT Treatment/Interventions Self-care/ADL training;Therapeutic exercise;DME and/or AE instruction;Functional Mobility Training;Balance training;Neuromuscular education;Manual Therapy;Energy conservation;Passive range of motion;Therapeutic activities;Patient/family education    Consulted and Agree with Plan of Care Patient             Patient will benefit from skilled therapeutic intervention in order to improve the following deficits and  impairments:   Body Structure / Function / Physical Skills: ADL, Coordination, GMC, UE functional use, Balance, Body mechanics, Decreased knowledge of use of DME, Flexibility, IADL, Dexterity,  FMC, Strength, Gait, Mobility, ROM       Visit Diagnosis: Muscle weakness (generalized)  Other lack of coordination    Problem List 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, psychotic disturbance, mood disturbance, or anxiety ([place]) 11/22/2021   TIA (transient ischemic attack) 09/05/2021   Stomatitis and mucositis 07/21/2021   Cheilitis 07/21/2021   Callus of foot 01/19/2021   Pes planus 01/19/2021   PTTD (posterior tibial tendon dysfunction) 01/19/2021   Acute flank pain 12/15/2020   Cystitis 12/15/2020   Urinary symptom or sign 12/15/2020   Low back pain radiating to right lower extremity 06/23/2020   Gastroesophageal reflux disease 04/28/2020   Large bowel obstruction ([place]) 04/17/2019   Acquired trigger finger 03/20/2019   Iron deficiency anemia 11/21/2018   Syncope 03/18/2018   Anemia 03/18/2018   Exophthalmos 02/22/2018   Multiple thyroid nodules 02/22/2018   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   Pain at surgical site 08/30/2016   Ventral incisional hernia 08/21/2016   SBO (small bowel obstruction) (HCC)    Partial small bowel obstruction ([place]) 04/23/2016   Adjustment disorder with mixed anxiety and depressed mood 04/23/2016   Memory difficulties 09/01/2015   Arthritis 06/11/2015   Back pain, chronic 06/11/2015   HLD (hyperlipidemia) 06/11/2015   Cannot sleep 06/11/2015   L-S radiculopathy 06/11/2015   Arthritis of knee, degenerative 06/11/2015   B12 deficiency 06/11/2015   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 [place], [place], [address] Phone: [phone]   Fax:  [phone]  Name: DIONICIA CERRITOS MRN: [id number] Date of Birth: April 14, 1951

## 2021-12-13 ENCOUNTER — Ambulatory Visit: Payer: Medicare Other

## 2021-12-13 ENCOUNTER — Inpatient Hospital Stay: Payer: Medicare Other

## 2021-12-13 VITALS — BP 120/71 | HR 71 | Temp 97.5°F

## 2021-12-13 DIAGNOSIS — Z79899 Other long term (current) drug therapy: Secondary | ICD-10-CM | POA: Diagnosis not present

## 2021-12-13 DIAGNOSIS — Z7982 Long term (current) use of aspirin: Secondary | ICD-10-CM | POA: Diagnosis not present

## 2021-12-13 DIAGNOSIS — D509 Iron deficiency anemia, unspecified: Secondary | ICD-10-CM

## 2021-12-13 MED ORDER — IRON SUCROSE 20 MG/ML IV SOLN
200.0000 mg | Freq: Once | INTRAVENOUS | Status: AC
  Administered 2021-12-13: 14:00:00 200 mg via INTRAVENOUS
  Filled 2021-12-13: qty 10

## 2021-12-13 MED ORDER — SODIUM CHLORIDE 0.9 % IV SOLN
Freq: Once | INTRAVENOUS | Status: AC
  Filled 2021-12-13: qty 250

## 2021-12-13 MED ORDER — SODIUM CHLORIDE 0.9 % IV SOLN: 200.0000 mg | Freq: Once | INTRAVENOUS | Status: DC

## 2021-12-13 NOTE — Patient Instructions (Signed)
[facility] AT [employer]  Discharge Instructions: Thank you for choosing [facility] to provide your oncology and hematology care.  If you have a lab appointment with the [facility], please go directly to the [facility] and check in at the registration area.  Wear comfortable clothing and clothing appropriate for easy access to any Portacath or PICC line.   We strive to give you quality time with your provider. You may need to reschedule your appointment if you arrive late (15 or more minutes).  Arriving late affects you and other patients whose appointments are after yours.  Also, if you miss three or more appointments without notifying the office, you may be dismissed from the clinic at the providers discretion.      For prescription refill requests, have your pharmacy contact our office and allow 72 hours for refills to be completed.    Today you received the following : Venofer   To help prevent nausea and vomiting after your treatment, we encourage you to take your nausea medication as directed.  BELOW ARE SYMPTOMS THAT SHOULD BE REPORTED IMMEDIATELY: *FEVER GREATER THAN 100.4 F (38 C) OR HIGHER *CHILLS OR SWEATING *NAUSEA AND VOMITING THAT IS NOT CONTROLLED WITH YOUR NAUSEA MEDICATION *UNUSUAL SHORTNESS OF BREATH *UNUSUAL BRUISING OR BLEEDING *URINARY PROBLEMS (pain or burning when urinating, or frequent urination) *BOWEL PROBLEMS (unusual diarrhea, constipation, pain near the anus) TENDERNESS IN MOUTH AND THROAT WITH OR WITHOUT PRESENCE OF ULCERS (sore throat, sores in mouth, or a toothache) UNUSUAL RASH, SWELLING OR PAIN  UNUSUAL VAGINAL DISCHARGE OR ITCHING   Items with * indicate a potential emergency and should be followed up as soon as possible or go to the Emergency Department if any problems should occur.  Please show the CHEMOTHERAPY ALERT CARD or IMMUNOTHERAPY ALERT CARD at check-in to the Emergency Department and triage  nurse.  Should you have questions after your visit or need to cancel or reschedule your appointment, please contact [facility] CANCER [place] AT [place]  [phone] and follow the prompts.  Office hours are 8:00 a.m. to 4:30 p.m. Monday - Friday. Please note that voicemails left after 4:00 p.m. may not be returned until the following business day.  We are closed weekends and major holidays. You have access to a nurse at all times for urgent questions. Please call the main number to the clinic [phone] and follow the prompts.  For any non-urgent questions, you may also contact your provider using MyChart. We now offer e-Visits for anyone 57 and older to request care online for non-urgent symptoms. For details visit [email or web address].   Also download the MyChart app! Go to the app store, search "MyChart", open the app, select [facility], and log in with your MyChart username and password.  Due to Covid, a mask is required upon entering the hospital/clinic. If you do not have a mask, one will be given to you upon arrival. For doctor visits, patients may have 1 support person aged 71 or older with them. For treatment visits, patients cannot have anyone with them due to current Covid guidelines and our immunocompromised population.

## 2021-12-14 ENCOUNTER — Ambulatory Visit: Payer: Medicare Other

## 2021-12-14 ENCOUNTER — Encounter: Payer: Self-pay | Admitting: *Deleted

## 2021-12-15 ENCOUNTER — Ambulatory Visit: Payer: Medicare Other | Admitting: Anesthesiology

## 2021-12-15 ENCOUNTER — Ambulatory Visit
Admission: RE | Admit: 2021-12-15 | Discharge: 2021-12-15 | Disposition: A | Discharge status: Home / Self Care | Payer: Medicare Other | Source: Ambulatory Visit | Attending: Gastroenterology | Admitting: Gastroenterology

## 2021-12-15 ENCOUNTER — Encounter
Admission: RE | Disposition: A | Discharge status: Home / Self Care | Payer: Self-pay | Source: Ambulatory Visit | Attending: Gastroenterology

## 2021-12-15 DIAGNOSIS — N1831 Chronic kidney disease, stage 3a: Secondary | ICD-10-CM | POA: Insufficient documentation

## 2021-12-15 DIAGNOSIS — Z79899 Other long term (current) drug therapy: Secondary | ICD-10-CM | POA: Insufficient documentation

## 2021-12-15 DIAGNOSIS — R6881 Early satiety: Secondary | ICD-10-CM | POA: Insufficient documentation

## 2021-12-15 DIAGNOSIS — I129 Hypertensive chronic kidney disease with stage 1 through stage 4 chronic kidney disease, or unspecified chronic kidney disease: Secondary | ICD-10-CM | POA: Insufficient documentation

## 2021-12-15 DIAGNOSIS — K219 Gastro-esophageal reflux disease without esophagitis: Secondary | ICD-10-CM | POA: Diagnosis not present

## 2021-12-15 DIAGNOSIS — G40201 Localization-related (focal) (partial) symptomatic epilepsy and epileptic syndromes with complex partial seizures, not intractable, with status epilepticus: Secondary | ICD-10-CM | POA: Diagnosis not present

## 2021-12-15 DIAGNOSIS — I252 Old myocardial infarction: Secondary | ICD-10-CM | POA: Insufficient documentation

## 2021-12-15 DIAGNOSIS — Z9884 Bariatric surgery status: Secondary | ICD-10-CM | POA: Insufficient documentation

## 2021-12-15 DIAGNOSIS — R110 Nausea: Secondary | ICD-10-CM | POA: Diagnosis not present

## 2021-12-15 DIAGNOSIS — E1122 Type 2 diabetes mellitus with diabetic chronic kidney disease: Secondary | ICD-10-CM | POA: Insufficient documentation

## 2021-12-15 DIAGNOSIS — G3183 Dementia with Lewy bodies: Secondary | ICD-10-CM | POA: Insufficient documentation

## 2021-12-15 DIAGNOSIS — Z8673 Personal history of transient ischemic attack (TIA), and cerebral infarction without residual deficits: Secondary | ICD-10-CM | POA: Diagnosis not present

## 2021-12-15 DIAGNOSIS — F02A0 Dementia in other diseases classified elsewhere, mild, without behavioral disturbance, psychotic disturbance, mood disturbance, and anxiety: Secondary | ICD-10-CM | POA: Diagnosis not present

## 2021-12-15 DIAGNOSIS — E785 Hyperlipidemia, unspecified: Secondary | ICD-10-CM | POA: Diagnosis not present

## 2021-12-15 DIAGNOSIS — Z980 Intestinal bypass and anastomosis status: Secondary | ICD-10-CM | POA: Diagnosis not present

## 2021-12-15 HISTORY — PX: ESOPHAGOGASTRODUODENOSCOPY (EGD) WITH PROPOFOL: SHX5813

## 2021-12-15 LAB — GLUCOSE, CAPILLARY
Glucose-Capillary: 57 mg/dL — ABNORMAL LOW (ref 70–99)
Glucose-Capillary: 210 mg/dL — ABNORMAL HIGH (ref 70–99)
Glucose-Capillary: 183 mg/dL — ABNORMAL HIGH (ref 70–99)

## 2021-12-15 SURGERY — ESOPHAGOGASTRODUODENOSCOPY (EGD) WITH PROPOFOL
Anesthesia: General

## 2021-12-15 MED ORDER — DEXTROSE 50 % IV SOLN
25.0000 g | Freq: Once | INTRAVENOUS | Status: AC
  Administered 2021-12-15: 09:00:00 25 g via INTRAVENOUS

## 2021-12-15 MED ORDER — SODIUM CHLORIDE 0.9 % IV SOLN
INTRAVENOUS | Status: DC
  Administered 2021-12-15: 09:00:00 20 mL/h via INTRAVENOUS

## 2021-12-15 MED ORDER — DEXTROSE 5 % AND 0.9 % NACL IV BOLUS
1000.0000 mL | Freq: Once | INTRAVENOUS | Status: AC
  Administered 2021-12-15: 09:00:00 1000 mL via INTRAVENOUS

## 2021-12-15 MED ORDER — PROPOFOL 10 MG/ML IV BOLUS
INTRAVENOUS | Status: DC | PRN
  Administered 2021-12-15: 50 mg via INTRAVENOUS

## 2021-12-15 MED ORDER — PROPOFOL 500 MG/50ML IV EMUL
INTRAVENOUS | Status: DC | PRN
  Administered 2021-12-15: 200 ug/kg/min via INTRAVENOUS

## 2021-12-15 MED ORDER — DEXTROSE 50 % IV SOLN
INTRAVENOUS | Status: AC
  Filled 2021-12-15: qty 50

## 2021-12-15 NOTE — Interval H&P Note (Signed)
History and Physical Interval Note:  12/15/2021 9:21 AM  Julie Acevedo  has presented today for surgery, with the diagnosis of Early satiety (R68.81).  The various methods of treatment have been discussed with the patient and family. After consideration of risks, benefits and other options for treatment, the patient has consented to  Procedure(s): ESOPHAGOGASTRODUODENOSCOPY (EGD) WITH PROPOFOL (N/A) as a surgical intervention.  The patient's history has been reviewed, patient examined, no change in status, stable for surgery.  I have reviewed the patient's chart and labs.  Questions were answered to the patient's satisfaction.     Lesly Rubenstein  Ok to proceed with EGD

## 2021-12-15 NOTE — Anesthesia Preprocedure Evaluation (Addendum)
Anesthesia Evaluation  Patient identified by MRN, date of birth, ID band Patient awake    Reviewed: Allergy & Precautions, NPO status , Patient's Chart, lab work & pertinent test results, reviewed documented beta blocker date and time   Airway Mallampati: III  TM Distance: >3 FB Neck ROM: Full    Dental no notable dental hx.    Pulmonary neg pulmonary ROS,    Pulmonary exam normal breath sounds clear to auscultation       Cardiovascular hypertension, Pt. on home beta blockers and Pt. 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  Stage IIIa. )  negative genitourinary   Musculoskeletal  (+) Arthritis ,   Abdominal Normal abdominal exam  (+)   Peds negative pediatric ROS (+)  Hematology  (+) anemia ,   Anesthesia Other Findings Hypoglylcemia 59. Pt feels tired and shaky. Prior to last attempt for EGD in 08/2021, pt had AMS with hypoglycemia and had a code stroke called with TPA administered. It was determined this was secondary to TIA.  Reproductive/Obstetrics negative OB ROS                            Anesthesia Physical Anesthesia Plan  ASA: 3  Anesthesia Plan: General   Post-op Pain Management:    Induction: Intravenous  PONV Risk Score and Plan:   Airway Management Planned: Natural Airway and Nasal Cannula  Additional Equipment:   Intra-op Plan:   Post-operative Plan:    Informed Consent: I have reviewed the patients History and Physical, chart, labs and discussed the procedure including the risks, benefits and alternatives for the proposed anesthesia with the patient or authorized representative who has indicated his/her understanding and acceptance.     Dental Advisory Given  Plan Discussed with: Anesthesiologist and CRNA  Anesthesia Plan Comments: (D50 given and D5NS started to treated hypoglycemia)       Anesthesia Quick Evaluation

## 2021-12-15 NOTE — Anesthesia Postprocedure Evaluation (Signed)
Anesthesia Post Note  Patient: Julie Acevedo  Procedure(s) Performed: ESOPHAGOGASTRODUODENOSCOPY (EGD) WITH PROPOFOL  Patient location during evaluation: Endoscopy Anesthesia Type: General Level of consciousness: awake and alert Pain management: pain level controlled Vital Signs Assessment: post-procedure vital signs reviewed and stable Respiratory status: spontaneous breathing, nonlabored ventilation and respiratory function stable Cardiovascular status: blood pressure returned to baseline and stable Postop Assessment: no apparent nausea or vomiting Anesthetic complications: no Comments: Blood sugar stable 183 post procedure   No notable events documented.   Last Vitals:  Vitals:   12/15/21 1000 12/15/21 1010  BP: 135/68 (!) 155/72  Pulse: 63 (!) 58  Resp: 18 18  Temp:    SpO2: 99% 100%    Last Pain:  Vitals:   12/15/21 0824  TempSrc: Temporal  PainSc: 0-No pain                 [place] Ouch

## 2021-12-15 NOTE — Telephone Encounter (Signed)
Patient aware of results and appt scheduled for further imaging.

## 2021-12-15 NOTE — H&P (Signed)
Outpatient short stay form Pre-procedure 12/15/2021  Julie Rubenstein, MD  Primary Physician: Jerrol Banana., MD  Reason for visit:  Early satiety  History of present illness:   70 y/o lady with history of gastric bypass, hypertension, and constipation here for EGD for early satiety. EGD was attempted 3 months ago but had altered mental status before procedure likely due to hypoglycemia but required hospitalization at that time for concern of stroke like symptoms.    Current Facility-Administered Medications:    0.9 %  sodium chloride infusion, , Intravenous, Continuous, Rhema Boyett, Hilton Cork, MD, Last Rate: 20 mL/hr at 12/15/21 0912, 20 mL/hr at 12/15/21 0912   dextrose 50 % solution, , , ,   Medications Prior to Admission  Medication Sig Dispense Refill Last Dose   amLODipine (NORVASC) 10 MG tablet Take 10 mg by mouth daily.   Past Week   aspirin EC 81 MG EC tablet Take 1 tablet (81 mg total) by mouth daily. Swallow whole. 30 tablet 11 Past Week   atorvastatin (LIPITOR) 40 MG tablet Take 1 tablet (40 mg total) by mouth daily. 30 tablet 2 Past Week   calcitRIOL (ROCALTROL) 0.25 MCG capsule Take 0.25 mcg by mouth daily.   Past Week   cyclobenzaprine (FLEXERIL) 5 MG tablet TAKE 1 TABLET BY MOUTH THREE TIMES A DAY AS NEEDED 60 tablet 3 Past Week   diclofenac (VOLTAREN) 0.1 % ophthalmic solution 4 (four) times daily.   Past Week   hydrOXYzine (VISTARIL) 100 MG capsule Take 1 capsule (100 mg total) by mouth 3 (three) times daily. 90 capsule 9 Past Week   LINZESS 290 MCG CAPS capsule TAKE 1 CAPSULE BY MOUTH EVERY DAY BEFORE BREAKFAST 90 capsule 0 Past Week   metFORMIN (GLUCOPHAGE) 500 MG tablet TAKE 1 TABLET (500 MG TOTAL) BY MOUTH 2 (TWO) TIMES DAILY WITH A MEAL. 180 tablet 3 Past Week   metoprolol succinate (TOPROL-XL) 25 MG 24 hr tablet TAKE 1 TABLET (25 MG TOTAL) BY MOUTH DAILY. 90 tablet 1 12/14/2021   omeprazole (PRILOSEC) 20 MG capsule Take 20 mg by mouth daily.   Past Week    ondansetron (ZOFRAN) 4 MG tablet TAKE 1 TABLET BY MOUTH EVERY 8 HOURS AS NEEDED 20 tablet 0 Past Week   pantoprazole (PROTONIX) 40 MG tablet Take 40 mg by mouth daily.   Past Week   rivastigmine (EXELON) 3 MG capsule Take 3 mg by mouth 2 (two) times daily.   Past Week   temazepam (RESTORIL) 30 MG capsule Take 30 mg by mouth at bedtime as needed for sleep.   Past Week   traMADol (ULTRAM) 50 MG tablet Take 1 tablet (50 mg total) by mouth every 6 (six) hours as needed. 100 tablet 2 Past Week   traZODone (DESYREL) 50 MG tablet TAKE 1 TABLET (50 MG TOTAL) BY MOUTH AT BEDTIME AS NEEDED. 90 tablet 4 Past Week   triamcinolone ointment (KENALOG) 0.5 % APPLY TO AFFECTED AREA TWICE A DAY 30 g 0 Past Week   gabapentin (NEURONTIN) 300 MG capsule Take 1 capsule (300 mg total) by mouth 3 (three) times daily. 180 capsule 3    Melatonin 10 MG CAPS Take 10 mg by mouth at bedtime as needed. (Patient not taking: Reported on 12/12/2021)        Allergies  Allergen Reactions   Lotrel [Amlodipine Besy-Benazepril Hcl] Anaphylaxis   Contrast Media [Iodinated Contrast Media] Swelling and Other (See Comments)    Reaction:  Neck swelling  Niacin Hives   Sulfa Antibiotics Hives     Past Medical History:  Diagnosis Date   Anemia    Arthritis    COVID-19 11/19/2019   Diverticulitis    DM (diabetes mellitus) ([place])    typ e 2   History of methicillin resistant staphylococcus aureus (MRSA) 2017   HTN (hypertension)    Hyperlipidemia    Memory difficulties 09/01/2015   Multiple thyroid nodules    Myocardial infarction ([place]) 1995   Short-term memory loss    Thyroid nodule     Review of systems:  Otherwise negative.    Physical Exam  Gen: Alert, oriented. Appears stated age.  HEENT: PERRLA. Lungs: No respiratory distress CV: RRR Abd: soft, benign, no masses Ext: No edema    Planned procedures: Proceed with EGD. The patient understands the nature of the planned procedure, indications, risks,  alternatives and potential complications including but not limited to bleeding, infection, perforation, damage to internal organs and possible oversedation/side effects from anesthesia. The patient agrees and gives consent to proceed.  Please refer to procedure notes for findings, recommendations and patient disposition/instructions.     Julie Rubenstein, MD [facility]astro[facility]ology

## 2021-12-15 NOTE — Op Note (Signed)
[facility] Gastroenterology Patient Name: Julie Acevedo Procedure Date: 12/15/2021 9:13 AM MRN: [id number] Account #: [id number] Date of Birth: August 24, 1951 Admit Type: Outpatient Age: 70 Room: [facility] ENDO ROOM 3 Gender: Female Note Status: Finalized Instrument Name: Altamese Cabal Endoscope [id number] Procedure:             Upper GI endoscopy Indications:           Early satiety Providers:             Andrey Farmer MD, MD Referring MD:          Janine Ores. Rosanna Randy, MD (Referring MD) Medicines:             Monitored Anesthesia Care Complications:         No immediate complications. Estimated blood loss:                         Minimal. Procedure:             Pre-Anesthesia Assessment:                        - Prior to the procedure, a History and Physical was                         performed, and patient medications and allergies were                         reviewed. The patient is competent. The risks and                         benefits of the procedure and the sedation options and                         risks were discussed with the patient. All questions                         were answered and informed consent was obtained.                         Patient identification and proposed procedure were                         verified by the physician, the nurse, the                         anesthesiologist, the anesthetist and the technician                         in the endoscopy suite. Mental Status Examination:                         alert and oriented. Airway Examination: normal                         oropharyngeal airway and neck mobility. Respiratory                         Examination: clear to auscultation. CV Examination:  normal. Prophylactic Antibiotics: The patient does not                         require prophylactic antibiotics. Prior                         Anticoagulants: The patient has taken no previous                          anticoagulant or antiplatelet agents. ASA Grade                         Assessment: III - A patient with severe systemic                         disease. After reviewing the risks and benefits, the                         patient was deemed in satisfactory condition to                         undergo the procedure. The anesthesia plan was to use                         monitored anesthesia care (MAC). Immediately prior to                         administration of medications, the patient was                         re-assessed for adequacy to receive sedatives. The                         heart rate, respiratory rate, oxygen saturations,                         blood pressure, adequacy of pulmonary ventilation, and                         response to care were monitored throughout the                         procedure. The physical status of the patient was                         re-assessed after the procedure.                        After obtaining informed consent, the endoscope was                         passed under direct vision. Throughout the procedure,                         the patient's blood pressure, pulse, and oxygen                         saturations were monitored continuously. The Endoscope  was introduced through the mouth, and advanced to the                         second part of duodenum. The upper GI endoscopy was                         accomplished without difficulty. The patient tolerated                         the procedure well. Findings:      The examined esophagus was normal.      Evidence of a Roux-en-Y gastrojejunostomy was found. The gastrojejunal       anastomosis was characterized by healthy appearing mucosa. The       jejunojejunal anastomosis was characterized by healthy appearing mucosa.      Normal mucosa was found in the stomach. Biopsies were taken with a cold       forceps for Helicobacter pylori testing. Estimated blood  loss was       minimal. Impression:            - Normal esophagus.                        - Roux-en-Y gastrojejunostomy with gastrojejunal                         anastomosis characterized by healthy appearing mucosa.                        - Normal mucosa was found in the stomach. Biopsied. Recommendation:        - Discharge patient to home.                        - Resume previous diet.                        - Continue present medications.                        - Await pathology results.                        - Return to referring physician as previously                         scheduled. Procedure Code(s):     --- Professional ---                        [phone], Esophagogastroduodenoscopy, flexible,                         transoral; with biopsy, single or multiple Diagnosis Code(s):     --- Professional ---                        Z98.0, Intestinal bypass and anastomosis status                        R68.81, Early satiety CPT copyright 2019 American Medical Association. All rights reserved. The codes documented in this report are preliminary and upon coder review may  be  revised to meet current compliance requirements. Andrey Farmer MD, MD 12/15/2021 9:48:28 AM Number of Addenda: 0 Note Initiated On: 12/15/2021 9:13 AM Estimated Blood Loss:  Estimated blood loss was minimal.      [facility]

## 2021-12-15 NOTE — Transfer of Care (Signed)
Immediate Anesthesia Transfer of Care Note  Patient: Julie Acevedo  Procedure(s) Performed: ESOPHAGOGASTRODUODENOSCOPY (EGD) WITH PROPOFOL  Patient Location: PACU  Anesthesia Type:General  Level of Consciousness: awake and sedated  Airway & Oxygen Therapy: Patient Spontanous Breathing and Patient connected to nasal cannula oxygen  Post-op Assessment: Report given to RN and Post -op Vital signs reviewed and stable  Post vital signs: Reviewed and stable  Last Vitals:  Vitals Value Taken Time  BP    Temp    Pulse 69 12/15/21 0942  Resp 19 12/15/21 0942  SpO2 100 % 12/15/21 0942  Vitals shown include unvalidated device data.  Last Pain:  Vitals:   12/15/21 0824  TempSrc: Temporal  PainSc: 0-No pain         Complications: No notable events documented.

## 2021-12-16 DIAGNOSIS — E1122 Type 2 diabetes mellitus with diabetic chronic kidney disease: Secondary | ICD-10-CM | POA: Diagnosis not present

## 2021-12-16 DIAGNOSIS — N1832 Chronic kidney disease, stage 3b: Secondary | ICD-10-CM

## 2021-12-16 DIAGNOSIS — I10 Essential (primary) hypertension: Secondary | ICD-10-CM

## 2021-12-16 DIAGNOSIS — E782 Mixed hyperlipidemia: Secondary | ICD-10-CM | POA: Diagnosis not present

## 2021-12-17 ENCOUNTER — Encounter: Payer: Self-pay | Admitting: Oncology

## 2021-12-19 ENCOUNTER — Encounter: Payer: Self-pay | Admitting: Gastroenterology

## 2021-12-19 ENCOUNTER — Telehealth: Payer: Self-pay | Admitting: Urology

## 2021-12-19 ENCOUNTER — Ambulatory Visit: Payer: Medicare Other

## 2021-12-19 LAB — SURGICAL PATHOLOGY

## 2021-12-19 NOTE — Telephone Encounter (Signed)
01/15/22  METATARSAL OSTEOTOMY 5TH LEFT --- [id number]   [facility] EFFECTIVE DATE - 12/17/21  PER [employer] WEBSITE FOR CPT CODE 70623 Notification or Prior Authorization is not required for the requested services  Decision ID #:[id number]

## 2021-12-20 ENCOUNTER — Encounter: Payer: Self-pay | Admitting: Oncology

## 2021-12-20 ENCOUNTER — Other Ambulatory Visit: Payer: Self-pay

## 2021-12-20 ENCOUNTER — Ambulatory Visit
Admission: RE | Admit: 2021-12-20 | Discharge: 2021-12-20 | Disposition: A | Discharge status: Home / Self Care | Payer: Medicare Other | Source: Ambulatory Visit | Attending: Physician Assistant | Admitting: Physician Assistant

## 2021-12-20 DIAGNOSIS — R928 Other abnormal and inconclusive findings on diagnostic imaging of breast: Secondary | ICD-10-CM | POA: Insufficient documentation

## 2021-12-20 DIAGNOSIS — R922 Inconclusive mammogram: Secondary | ICD-10-CM | POA: Diagnosis not present

## 2021-12-20 DIAGNOSIS — R921 Mammographic calcification found on diagnostic imaging of breast: Secondary | ICD-10-CM | POA: Diagnosis not present

## 2021-12-21 ENCOUNTER — Ambulatory Visit: Payer: Medicare Other | Attending: Physician Assistant

## 2021-12-21 DIAGNOSIS — R2689 Other abnormalities of gait and mobility: Secondary | ICD-10-CM | POA: Insufficient documentation

## 2021-12-21 DIAGNOSIS — F02A0 Dementia in other diseases classified elsewhere, mild, without behavioral disturbance, psychotic disturbance, mood disturbance, and anxiety: Secondary | ICD-10-CM | POA: Insufficient documentation

## 2021-12-21 DIAGNOSIS — M6281 Muscle weakness (generalized): Secondary | ICD-10-CM | POA: Insufficient documentation

## 2021-12-21 DIAGNOSIS — G3183 Dementia with Lewy bodies: Secondary | ICD-10-CM | POA: Insufficient documentation

## 2021-12-21 DIAGNOSIS — R278 Other lack of coordination: Secondary | ICD-10-CM | POA: Insufficient documentation

## 2021-12-21 DIAGNOSIS — R2681 Unsteadiness on feet: Secondary | ICD-10-CM | POA: Insufficient documentation

## 2021-12-25 ENCOUNTER — Telehealth: Payer: Self-pay

## 2021-12-25 NOTE — Progress Notes (Signed)
° ° °  Chronic Care Management Pharmacy Assistant   Name: Julie Acevedo  MRN: [id number] DOB: 11-Dec-1951  Patient called to be reminded of her telephone appointment with [name], CPP on 12/26/2021 @ 0830  Patient aware of appointment date, time, and type of appointment (either telephone or in person). Patient aware to have/bring all medications, supplements, blood pressure and/or blood sugar logs to visit.  Questions:  Are there any concerns you would like to discuss during your office visit? Nothing at this time  Are you having any problems obtaining your medications? Not at this time  Star Rating Drug: Atorvastatin 40 mg last filled on 09/08/2021 for a 90-Day supply with [facility] Metformin 500 mg last filled on 07/31/022 for a 90-Day supply with [facility]  Any gaps in medications fill history? Yes  Care Gaps: Diabetic Eye Exam Urine Microalbumin  Lynann Bologna, [place] Pharmacist Assistant Phone: [phone]

## 2021-12-26 ENCOUNTER — Other Ambulatory Visit: Payer: Self-pay

## 2021-12-26 ENCOUNTER — Ambulatory Visit (INDEPENDENT_AMBULATORY_CARE_PROVIDER_SITE_OTHER): Payer: Medicare Other

## 2021-12-26 DIAGNOSIS — I10 Essential (primary) hypertension: Secondary | ICD-10-CM

## 2021-12-26 DIAGNOSIS — E1122 Type 2 diabetes mellitus with diabetic chronic kidney disease: Secondary | ICD-10-CM

## 2021-12-26 NOTE — Progress Notes (Signed)
Chronic Care Management Pharmacy Note  01/01/2022 Name:  Julie Acevedo MRN:  [id number] DOB:  [birth year]  Summary: Patient presents for CCM follow-up.   Recommendations/Changes made from today's visit: Continue current medications  Plan: CPP follow-up 3 months  Subjective: Julie Acevedo is an 71 y.o. year old female who is a primary patient of Jerrol Banana., MD.  The CCM team was consulted for assistance with disease management and care coordination needs.    Engaged with patient by telephone for follow up visit in response to provider referral for pharmacy case management and/or care coordination services.   Consent to Services:  The patient was given information about Chronic Care Management services, agreed to services, and gave verbal consent prior to initiation of services.  Please see initial visit note for detailed documentation.   Patient Care Team: Jerrol Banana., MD as PCP - General (Family Medicine) Yolonda Kida, MD as PCP - Cardiology (Cardiology) Lorelee Cover., MD as Consulting Physician (Ophthalmology) Bjorn Loser, MD as Consulting Physician (Urology) Bary Castilla, Forest Gleason, MD as Consulting Physician (General Surgery) Jules Husbands, MD as Consulting Physician (General Surgery) Gardiner Barefoot, DPM as Consulting Physician (Podiatry) Gae Dry, MD as Referring Physician (Obstetrics and Gynecology) Vladimir Crofts, MD as Consulting Physician (Neurology) Randon Goldsmith, OT as Occupational Therapist (Occupational Therapy) Neldon Labella, RN as Case Manager  Recent office visits:  11/22/21: Patient presented to Mikey Kirschner, PA-C for follow-up. HCTZ, magic mouthwase, PEG, vaginal lubricant stopped.  09/13/21: Patient presented to Dr. Rosanna Randy for hospital follow-up.  07/21/2021 Tally Joe FNP (PCP Office Visit) for Stomatitis and mucositis- started on magic mouthwash, and Triamcinolone Acetonide 0.5%; Discontinued: Amoxicilliam  524m, Diphenhydramine HCl 50 mg, Lidocaine HCl 2%, Prasterone 6.5 all due to patient not taking. 04/25/21: Patient presented to Dr. GRosanna Randyfor allergic rhinitis. No medication changes noted.   Recent consult visits:  09/14/21: Patient presented to [facility](Neurology[facility] for follow-up.   07/06/2021 MLyla Son MD (Nephrology)    Hospital visits: 9/20-9/23/22: Patient hospitalized for TIA   Admitted to the hospital on 06/19/2021 due to Esophagitis. Discharge date was 06/20/2021. [facility] -started  amoxicillin 500 MG Take 1 capsule (500 mg total) by mouth 3 (three) times daily. capsule  lidocaine 2 % solution Use as directed 15 mLs in the mouth or throat as needed for mouth pain. pantoprazole 40 MG tablet Take 1 tablet (40 mg total) by mouth daily.   Medication Changes at Hospital Discharge: -Changed None ID   Medications Discontinued at Hospital Discharge: -Stopped Omeprazole 20 mg  due to Change in therapy   Medications that remain the same after Hospital Discharge:??  -All other medications will remain the same.    Objective:  Lab Results  Component Value Date   CREATININE 1.42 (H) 09/13/2021   BUN 13 09/13/2021   GFRNONAA 52 (L) 09/07/2021   GFRAA 42 (L) 12/05/2020   NA 141 09/13/2021   K 4.4 09/13/2021   CALCIUM 8.9 09/13/2021   CO2 26 09/13/2021    Lab Results  Component Value Date/Time   HGBA1C 5.6 09/06/2021 04:43 AM   HGBA1C 5.3 08/29/2021 09:03 AM   HGBA1C 5.5 03/06/2021 04:26 PM   HGBA1C 6.3 01/18/2014 06:32 AM   MICROALBUR 20 06/23/2020 02:10 PM   MICROALBUR 50 11/18/2017 09:33 AM    Last diabetic Eye exam:  Lab  Results  Component Value Date/Time   HMDIABEYEEXA No Retinopathy 12/03/2019 12:00 AM    Last diabetic Foot exam: No results found for: HMDIABFOOTEX   Lab Results  Component Value Date   CHOL 134 09/06/2021    HDL 60 09/06/2021   LDLCALC 63 09/06/2021   TRIG 54 09/06/2021   CHOLHDL 2.2 09/06/2021    Hepatic Function Latest Ref Rng & Units 09/13/2021 09/05/2021 06/19/2021  Total Protein 6.5 - 8.1 g/dL - 6.4(L) 6.6  Albumin 3.8 - 4.8 g/dL 3.9 3.3(L) 3.4(L)  AST 15 - 41 U/L - 17 13(L)  ALT 0 - 44 U/L - 7 8  Alk Phosphatase 38 - 126 U/L - 152(H) 168(H)  Total Bilirubin 0.3 - 1.2 mg/dL - 0.6 0.4    Lab Results  Component Value Date/Time   TSH 1.896 06/28/2021 01:23 PM   TSH 3.730 12/05/2020 08:55 AM   TSH 2.170 08/18/2020 11:36 AM   FREET4 0.91 06/28/2021 01:23 PM    CBC Latest Ref Rng & Units 11/30/2021 09/13/2021 09/07/2021  WBC 4.0 - 10.5 K/uL 7.3 8.6 6.6  Hemoglobin 12.0 - 15.0 g/dL 10.5(L) 11.3 11.9(L)  Hematocrit 36.0 - 46.0 % 33.3(L) 33.5(L) 37.0  Platelets 150 - 400 K/uL 285 262 222    Lab Results  Component Value Date/Time   VD25OH 8.7 (L) 06/16/2019 03:51 PM    Clinical ASCVD: Yes  The ASCVD Risk score (Arnett DK, et al., 2019) failed to calculate for the following reasons:   The patient has a prior MI or stroke diagnosis    Depression screen [facility] 2/9 12/26/2021 12/12/2021 11/22/2021  Decreased Interest 3 0 0  Down, Depressed, Hopeless 0 0 0  PHQ - 2 Score 3 0 0  Altered sleeping 0 - 2  Tired, decreased energy 0 - 3  Change in appetite 0 - 3  Feeling bad or failure about yourself  0 - -  Trouble concentrating 0 - 0  Moving slowly or fidgety/restless 3 - 0  Suicidal thoughts 0 - 0  PHQ-9 Score 6 - 8  Difficult doing work/chores Very difficult - -  Some recent data might be hidden    Social History   Tobacco Use  Smoking Status Never  Smokeless Tobacco Never   BP Readings from Last 3 Encounters:  12/15/21 (!) 155/72  12/13/21 120/71  12/07/21 115/62   Pulse Readings from Last 3 Encounters:  12/15/21 (!) 58  12/13/21 71  12/07/21 65   Wt Readings from Last 3 Encounters:  12/15/21 126 lb (57.2 kg)  11/30/21 126 lb 6.4 oz (57.3 kg)  11/22/21 125 lb (56.7  kg)    Assessment/Interventions: Review of patient past medical history, allergies, medications, health status, including review of consultants reports, laboratory and other test data, was performed as part of comprehensive evaluation and provision of chronic care management services.   SDOH:  (Social Determinants of Health) assessments and interventions performed: Yes SDOH Interventions    Flowsheet Row Most Recent Value  SDOH Interventions   Financial Strain Interventions Intervention Not Indicated         CCM Care Plan  Allergies  Allergen Reactions   Lotrel [Amlodipine Besy-Benazepril Hcl] Anaphylaxis   Contrast Media [Iodinated Contrast Media] Swelling and Other (See Comments)    Reaction:  Neck swelling    Niacin Hives   Sulfa Antibiotics Hives    Medications Reviewed Today     Reviewed by Darleene Cleaver, OT (Occupational Therapist) on 12/29/21 at 0855  Med List Status: <  None>   Medication Order Taking? Sig Documenting Provider Last Dose Status Informant  aspirin EC 81 MG EC tablet [id number] No Take 1 tablet (81 mg total) by mouth daily. Swallow whole. Pokhrel, Laxman, MD Taking Active   atorvastatin (LIPITOR) 40 MG tablet [id number] No Take 1 tablet (40 mg total) by mouth daily. Pokhrel, Laxman, MD Taking Active   calcitRIOL (ROCALTROL) 0.25 MCG capsule [id number] No Take 0.25 mcg by mouth daily. [provider] Taking Active Pharmacy Records  cyclobenzaprine (FLEXERIL) 5 MG tablet [id number] No TAKE 1 TABLET BY MOUTH THREE TIMES A DAY AS NEEDED Jerrol Banana., MD Taking Active   gabapentin (NEURONTIN) 300 MG capsule [id number] No Take 1 capsule (300 mg total) by mouth 3 (three) times daily. Gae Dry, MD Taking Expired 12/26/21 2359   hydrOXYzine (VISTARIL) 100 MG capsule [id number] No Take 1 capsule (100 mg total) by mouth 3 (three) times daily. Gae Dry, MD Taking Active   LINZESS 290 MCG CAPS capsule [id number] No TAKE 1 CAPSULE BY MOUTH  EVERY DAY BEFORE BREAKFAST Jerrol Banana., MD Taking Active   metFORMIN (GLUCOPHAGE) 500 MG tablet [id number] No TAKE 1 TABLET (500 MG TOTAL) BY MOUTH 2 (TWO) TIMES DAILY WITH A MEAL.  Patient taking differently: Take 500 mg by mouth daily with breakfast.   Jerrol Banana., MD Taking Active Pharmacy Records           Med Note [facility], [facility]   Mon Feb 27, 2021  1:30 PM)    metoprolol succinate (TOPROL-XL) 25 MG 24 hr tablet [id number] No TAKE 1 TABLET (25 MG TOTAL) BY MOUTH DAILY. Jerrol Banana., MD Taking Active   omeprazole (PRILOSEC) 20 MG capsule [id number] No Take 20 mg by mouth daily. [provider] Taking Active   ondansetron (ZOFRAN) 4 MG tablet [id number] No TAKE 1 TABLET BY MOUTH EVERY 8 HOURS AS NEEDED Gwyneth Sprout, FNP Taking Active   rivastigmine (EXELON) 3 MG capsule [id number] No Take 3 mg by mouth 2 (two) times daily.  Patient not taking: Reported on 12/26/2021   [provider] Not Taking Active Pharmacy Records  traMADol (ULTRAM) 50 MG tablet [id number] No Take 1 tablet (50 mg total) by mouth every 6 (six) hours as needed. Jerrol Banana., MD Taking Active Pharmacy Records  traZODone (DESYREL) 50 MG tablet [id number] No TAKE 1 TABLET (50 MG TOTAL) BY MOUTH AT BEDTIME AS NEEDED. 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, psychotic disturbance, mood disturbance, or anxiety ([place]) 11/22/2021   TIA (transient ischemic attack) 09/05/2021   Stomatitis and mucositis 07/21/2021   Cheilitis 07/21/2021   Callus of foot 01/19/2021   Pes planus 01/19/2021   PTTD (posterior tibial tendon dysfunction) 01/19/2021   Acute flank pain  12/15/2020   Cystitis 12/15/2020   Urinary symptom or sign 12/15/2020   Low back pain radiating to right lower extremity 06/23/2020   Gastroesophageal reflux disease 04/28/2020   Large bowel obstruction ([place]) 04/17/2019   Acquired trigger finger 03/20/2019   Iron deficiency anemia 11/21/2018   Syncope 03/18/2018   Anemia 03/18/2018  Exophthalmos 02/22/2018   Multiple thyroid nodules 02/22/2018   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   Pain at surgical site 08/30/2016   Ventral incisional hernia 08/21/2016   SBO (small bowel obstruction) (HCC)    Partial small bowel obstruction ([place]) 04/23/2016   Adjustment disorder with mixed anxiety and depressed mood 04/23/2016   Memory difficulties 09/01/2015   Arthritis 06/11/2015   Back pain, chronic 06/11/2015   HLD (hyperlipidemia) 06/11/2015   Cannot sleep 06/11/2015   L-S radiculopathy 06/11/2015   Arthritis of knee, degenerative 06/11/2015   B12 deficiency 06/11/2015   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   DM2 (diabetes mellitus, type 2) ([place]) 11/15/2012   Adiposity 11/21/2009   Avitaminosis D 11/07/2009   Narrowing of intervertebral disc space 07/25/2009   Benign essential HTN 07/25/2009    Immunization History  Administered Date(s) Administered   Fluad Quad(high Dose 65+) 08/15/2019, 09/01/2020, 09/13/2021   Influenza Split 10/17/2010, 10/02/2011, 08/27/2012   Influenza, High Dose Seasonal PF 08/29/2018   Influenza,inj,Quad PF,6+ Mos 09/26/2013, 09/06/2014, 09/06/2016   Influenza,inj,Quad PF,6-35 Mos 08/31/2017   Influenza-Unspecified 09/15/2015   PFIZER(Purple Top)SARS-COV-2 Vaccination 01/29/2020, 02/23/2020, 10/11/2020   PNEUMOCOCCAL CONJUGATE-20 06/24/2021   Pneumococcal Conjugate-13 08/29/2018   Pneumococcal Polysaccharide-23 11/17/2012   Tdap 10/02/2011, 09/27/2016   Zoster Recombinat (Shingrix) 08/29/2018, 08/15/2019   Zoster, Live 09/06/2016     Conditions to be addressed/monitored:  Hypertension, Hyperlipidemia, Diabetes, GERD and Chronic Pain  Care Plan : General Pharmacy (Adult)  Updates made by Germaine Pomfret, RPH since 01/01/2022 12:00 AM     Problem: Hypertension, Hyperlipidemia, Diabetes, GERD and Chronic Pain   Priority: High     Long-Range Goal: Patient-Specific Goal   Start Date: 02/27/2021  Expected End Date: 09/19/2022  This Visit's Progress: On track  Recent Progress: On track  Priority: High  Note:   Current Barriers:  Unable to achieve control of cholesterol  Suboptimal therapeutic regimen for hypertension  Pharmacist Clinical Goal(s):  patient will achieve control of cholesterol as evidenced by LDL less than 70 maintain control of blood pressure as evidenced by BP less than 140/90 and improvement of dizziness symptoms  through collaboration with PharmD and provider.   Interventions: 1:1 collaboration with Jerrol Banana., MD regarding development and update of comprehensive plan of care as evidenced by provider attestation and co-signature Inter-disciplinary care team collaboration (see longitudinal plan of care) Comprehensive medication review performed; medication list updated in electronic medical record  Hypertension (BP goal <140/90) -Not ideally controlled -Current treatment:   Metoprolol XL 25 mg daily  -Medications previously tried: HCTZ (Hypotension)  -Current home readings: 90/60, 86/68, 139/82 -Current dietary habits: Patient is not following any specific dietary regimen -Current exercise habits: Patient is not following any specific exerciseregimen -Reports hypotensive symptoms -Patient reports frequent dizziness, especially when bending over.  -Continue current medications  Hyperlipidemia: (LDL goal < 70) -History of MI 1999 -History of TIA Sep 2022 -Uncontrolled -Current treatment: Atorvastatin 40 mg daily -Medications previously tried: NA  -Continue current  medications   Diabetes (A1c goal <7%) -Controlled -Current medications: Metformin 500 mg daily  -Medications previously tried: NA  -Current home glucose readings fasting glucose: 76,  -Denies hypoglycemic/hyperglycemic symptoms -Educated on Benefits of routine self-monitoring of blood sugar; Carbohydrate counting and/or plate method -Continue current medications  Dementia (Goal: Prevent further decline in memory) -Managed by Dr. [name] -Uncontrolled -Current treatment  Rivastigmine 3 mg twice daily  -Medications previously tried: NA -Stopped  -Continue current  medications  Patient Goals/Self-Care Activities patient will:  - check blood pressure 2-3 times weekly, document, and provide at future appointments target a minimum of 150 minutes of moderate intensity exercise weekly  Follow Up Plan: Telephone follow up appointment with care management team member scheduled for:  03/26/2022 at 8:30 AM    Medication Assistance: None required.  Patient affirms current coverage meets needs.  Compliance/Adherence/Medication fill history: Care Gaps: Diabetic Eye Exam Urine Microalbumin COVID-19 Vaccine Booster 5  Star-Rating Drugs: Atorvastatin 40 mg ast filled on 09/08/2021 for a 90-Day supply Metformin 500 mg last filled on 07/16/2021 for a 90-Day supply with [facility]  Patient's preferred pharmacy [facility]  [id number] [place] [id number]: [phone]: [phone]  Uses pill box? Yes Pt endorses 100% compliance  We discussed: Current pharmacy is preferred with insurance plan and patient is satisfied with pharmacy services Patient decided to: Continue current medication management strategy  Care Plan and Follow Up Patient Decision:  Patient agrees to Care Plan and Follow-up.  Plan: Telephone follow up appointment with care management team member scheduled for:  03/26/2022 at 8:30 AM

## 2021-12-26 NOTE — Patient Outreach (Signed)
[facility]) Care Management  12/26/2021  DALEISA HALPERIN [birth year] [phone]  [facility] Evaluation Interviewer made contact with patient. Aging Gracefully second survey completed.   Interviewer will send referral to [facility] for follow up.  [place] Management Assistant  [phone]

## 2021-12-27 ENCOUNTER — Ambulatory Visit: Payer: Medicare Other

## 2021-12-27 ENCOUNTER — Other Ambulatory Visit: Payer: Self-pay

## 2021-12-27 DIAGNOSIS — G3183 Dementia with Lewy bodies: Secondary | ICD-10-CM | POA: Diagnosis present

## 2021-12-27 DIAGNOSIS — R2681 Unsteadiness on feet: Secondary | ICD-10-CM

## 2021-12-27 DIAGNOSIS — F02A0 Dementia in other diseases classified elsewhere, mild, without behavioral disturbance, psychotic disturbance, mood disturbance, and anxiety: Secondary | ICD-10-CM

## 2021-12-27 DIAGNOSIS — M6281 Muscle weakness (generalized): Secondary | ICD-10-CM | POA: Diagnosis not present

## 2021-12-27 DIAGNOSIS — R278 Other lack of coordination: Secondary | ICD-10-CM

## 2021-12-27 DIAGNOSIS — R2689 Other abnormalities of gait and mobility: Secondary | ICD-10-CM | POA: Diagnosis not present

## 2021-12-27 NOTE — Therapy (Signed)
[place] MAIN [facility] SERVICES [address] [place], [place], [address] Phone: [phone]   Fax:  [phone]  Occupational Therapy Treatment  Patient Details  Name: Julie Acevedo MRN: [id number] Date of Birth: 21-Oct-1951 Referring Provider (OT): Mikey Kirschner, [place]   Encounter Date: 12/27/2021   OT End of Session - 12/27/21 1147     Visit Number 4    Number of Visits 12    Date for OT Re-Evaluation 01/09/22    Authorization Type Reporting period beginning 11/29/2021    OT Start Time 41    OT Stop Time 1142    OT Time Calculation (min) 42 min    Activity Tolerance Patient tolerated treatment well    Behavior During Therapy [facility] for tasks assessed/performed             Past Medical History:  Diagnosis Date   Anemia    Arthritis    COVID-19 11/19/2019   Diverticulitis    DM (diabetes mellitus) ([place])    typ e 2   History of methicillin resistant staphylococcus aureus (MRSA) 2017   HTN (hypertension)    Hyperlipidemia    Memory difficulties 09/01/2015   Multiple thyroid nodules    Myocardial infarction ([place]) 1995   Short-term memory loss    Thyroid nodule     Past Surgical History:  Procedure Laterality Date   ABDOMINAL HYSTERECTOMY     due to endometriosis-1 ovary left   BREAST EXCISIONAL BIOPSY Right 1971   neg   CARDIAC CATHETERIZATION  2000   no significant CAD per note of Dr Caryl Comes in 2013    [place] N/A 03/19/2017   Procedure: ANTERIOR REPAIR (CYSTOCELE);  Surgeon: Bjorn Loser, MD;  Location: [facility];  Service: Urology;  Laterality: N/A;   CYSTOSCOPY N/A 03/19/2017   Procedure: CYSTOSCOPY;  Surgeon: Bjorn Loser, MD;  Location: [facility];  Service: Urology;  Laterality: N/A;   ESOPHAGOGASTRODUODENOSCOPY (EGD) WITH PROPOFOL N/A 12/15/2021   Procedure: ESOPHAGOGASTRODUODENOSCOPY (EGD) WITH PROPOFOL;  Surgeon: Lesly Rubenstein, MD;  Location: [facility];  Service: Endoscopy;   Laterality: N/A;   EYE SURGERY     FOOT SURGERY     HAND SURGERY     HEMICOLECTOMY     INSERTION OF MESH N/A 08/21/2016   Procedure: INSERTION OF MESH;  Surgeon: Excell Seltzer, MD;  Location: [facility];  Service: General;  Laterality: N/A;   INSERTION OF MESH N/A 12/08/2019   Procedure: INSERTION OF MESH;  Surgeon: Jules Husbands, MD;  Location: [facility];  Service: General;  Laterality: N/A;   LAPAROSCOPIC LYSIS OF ADHESIONS N/A 08/21/2016   Procedure: LAPAROSCOPIC LYSIS OF ADHESIONS;  Surgeon: Excell Seltzer, MD;  Location: [facility];  Service: General;  Laterality: N/A;   LAPAROSCOPIC REMOVAL ABDOMINAL MASS     LAPAROTOMY N/A 04/16/2019   Procedure: EXPLORATORY LAPAROTOMY;  Surgeon: Benjamine Sprague, DO;  Location: [facility];  Service: General;  Laterality: N/A;   LOOP RECORDER INSERTION N/A 03/21/2018   Procedure: LOOP RECORDER INSERTION;  Surgeon: Sanda Klein, MD;  Location: [facility];  Service: Cardiovascular;  Laterality: N/A;   LUMBAR DISC SURGERY     ROUX-EN-Y GASTRIC BYPASS  2010   Dr Hassell Done   SMALL INTESTINE SURGERY     TONSILLECTOMY     UPPER GI ENDOSCOPY  01/12/2016   normal larynx, normal esophagus. gastric bypass with a normal-sized pouch and intact staple line, normal examined jejunum, otherwise exam was normal  VENTRAL HERNIA REPAIR N/A 08/21/2016   Procedure: LAPAROSCOPIC VENTRAL HERNIA;  Surgeon: Excell Seltzer, MD;  Location: [facility];  Service: General;  Laterality: N/A;   XI ROBOTIC ASSISTED VENTRAL HERNIA N/A 12/08/2019   Procedure: XI ROBOTIC ASSISTED VENTRAL HERNIA;  Surgeon: Jules Husbands, MD;  Location: [facility];  Service: General;  Laterality: N/A;    There were no vitals filed for this visit.   Subjective Assessment - 12/27/21 1106     Subjective  "The pain in my back is always going to be there.  I have arthritis."    Patient is accompanied by: Family member    Pertinent History [name], UB/LB tremors, frequent falls     Limitations STM impairment, reports frequent hallucinations, UB/LB tremors, frequent falls    Patient Stated Goals Pt reports she wishes she didn't have tremors but is receptive to strategies to help manage self care tasks with tremors present.    Currently in Pain? Yes    Pain Score 8     Pain Location Back    Pain Orientation Lower    Pain Descriptors / Indicators Aching;Dull    Pain Type Chronic pain    Pain Onset More than a month ago    Pain Frequency Constant    Aggravating Factors  moving around    Pain Relieving Factors rest, heat, pain meds    Effect of Pain on Daily Activities limited activity tolerance for mobility    Multiple Pain Sites No            Occupational Therapy Treatment: Self Care: Reviewed WB to BUEs during self feeding.  Pt reported that swivel spoon was really effective to reduce spilling and that she's also trying to remember to lean into her arms when eating or drinking.  Pt states that she still has a hard time drinking her tea.  OT reinforced recommendation for using cup with a lid and handle.  Pt states she's been using a lid for her coffee but will plan to do this with her tea as well.  Participated in overhead reaching activity stacking dishes on second shelf of ADL kitchen cupboard.  Used 2 hands to reach for heavier items.  Min vc to widen BOS when reaching outside BOS and to lean hips into countertop for extra stability.  Pt returned demo with tactile and vc.  Reinforced fall prevention in kitchen, including making sure to widen BOS when reaching to floor, hold to countertop for support when able.  If able, slide item closer to countertop to reach down while holding to countertop.  Reinforced importance of slowing pace with positional changes and turning lights on when up at night, as pt reported that her last fall occurred at night when she neglected to turn on the light.   Therapeutic Exercise: Facilitated hand strengthening with use of hand gripper set  at 11.2# to remove jumbo pegs from pegboard x1 trial L hand and 2 trials R hand.  Pt participated in sitting UBE x 2.5 min forward rotation x 2.5 min reverse rotation, minimal resistance, working to increase BUE strength and activity tolerance for self care.  Pt participated in [place] with use of 1.5# dowel for chest press, shoulder flexion, horiz abd/add, abd, ER, IR/extension x 2 sets 10 reps each, rest between sets and min vc for technique and posture.   Response to Treatment: See Plan/clinical impression below.    OT Education - 12/27/21 1146     Education Details BUE  ther ex    Person(s) Educated Patient    Methods Explanation;Verbal cues;Demonstration;Tactile cues    Comprehension Verbalized understanding;Need further instruction;Verbal cues required;Returned demonstration              OT Short Term Goals - 11/30/21 0851       OT SHORT TERM GOAL #1   Title Pt will perform HEP for BUE strengthening and coordination training with mod vc/supv from family member.    Baseline Eval: not yet initiated    Time 3    Period Weeks    Status New    Target Date 12/19/21               OT Long Term Goals - 11/30/21 0852       OT LONG TERM GOAL #1   Title Pt will increase FOTO score to 50 or better to indicate increased functional performance.    Baseline Eval: FOTO 45    Time 6    Period Weeks    Status New    Target Date 01/09/22      OT LONG TERM GOAL #2   Title Pt will improve R shoulder strength by 1 MM grade to ease tolerance for reaching overhead with self care/IADL tasks.    Baseline Eval: R shoulder strength 3-/5    Time 6    Period Weeks    Status New    Target Date 01/09/22      OT LONG TERM GOAL #3   Title Pt will increase R grip strength by 5 or more lbs to increase tolerance for carrying/transporting heavier ADL supplies.    Baseline Eval: R grip 35 lbs (L dominant is 44 lbs)    Time 6    Period Weeks    Status New    Target Date 01/09/22       OT LONG TERM GOAL #4   Title Pt will ulitize compensatory ADL strategies and AE as needed with mod vc to reduce fall risk with self care and functional mobility.    Baseline Eval: Family making handicapped bathroom modifications next week; frequent falls with ADLs    Time 6    Period Weeks    Status New    Target Date 01/09/22      OT LONG TERM GOAL #5   Title Pt will utilize WB strategies as needed with mod vc to manage tremors during self care tasks.    Baseline Eval: not yet initiated education    Time 6    Period Weeks    Status New    Target Date 01/09/22                   Plan - 12/27/21 1201     Clinical Impression Statement Pt reported that swivel spoon was really effective to reduce spilling and that she's also trying to remember to lean into her arms when eating or drinking.  Pt states that she still has a hard time drinking her tea.  OT reinforced recommendation for using cup with a lid and handle.  Pt states she's been using a lid for her coffee but will plan to do this with her tea as well.  OT reinforced fall prevention strategies this date, see note for details.  Good tolerance to BUE strengthening with rest breaks and minimal pain in bilat shoulders, reporting more significant pain in her back which is constant from arthritis.  Moist heat offered for back, which pt declined.  Pt will  continue to benefit from skilled OT for instruction in ADL strategies and BUE strengthening in order to maximize indep and safety with daily tasks.    OT Occupational Profile and History Problem Focused Assessment - Including review of records relating to presenting problem    Occupational performance deficits (Please refer to evaluation for details): ADL's;IADL's    Body Structure / Function / Physical Skills ADL;Coordination;GMC;UE functional use;Balance;Body mechanics;Decreased knowledge of use of DME;Flexibility;IADL;Dexterity;FMC;Strength;Gait;Mobility;ROM    Rehab Potential  Good    Clinical Decision Making Several treatment options, min-mod task modification necessary    Comorbidities Affecting Occupational Performance: May have comorbidities impacting occupational performance    Modification or Assistance to Complete Evaluation  No modification of tasks or assist necessary to complete eval    OT Frequency 2x / week    OT Duration 6 weeks    OT Treatment/Interventions Self-care/ADL training;Therapeutic exercise;DME and/or AE instruction;Functional Mobility Training;Balance training;Neuromuscular education;Manual Therapy;Energy conservation;Passive range of motion;Therapeutic activities;Patient/family education    Consulted and Agree with Plan of Care Patient             Patient will benefit from skilled therapeutic intervention in order to improve the following deficits and impairments:   Body Structure / Function / Physical Skills: ADL, Coordination, GMC, UE functional use, Balance, Body mechanics, Decreased knowledge of use of DME, Flexibility, IADL, Dexterity, FMC, Strength, Gait, Mobility, ROM       Visit Diagnosis: Muscle weakness (generalized)  Other lack of coordination  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, psychotic disturbance, mood disturbance, or anxiety (HCC)    Problem List 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, psychotic disturbance, mood disturbance, or anxiety (HCC) 11/22/2021   TIA (transient ischemic attack) 09/05/2021   Stomatitis and mucositis 07/21/2021   Cheilitis 07/21/2021   Callus of foot 01/19/2021   Pes planus 01/19/2021   PTTD (posterior tibial tendon dysfunction) 01/19/2021   Acute flank pain 12/15/2020   Cystitis 12/15/2020   Urinary symptom or sign 12/15/2020   Low back pain radiating to right lower extremity 06/23/2020   Gastroesophageal reflux disease 04/28/2020   Large bowel obstruction ([place]) 04/17/2019   Acquired  trigger finger 03/20/2019   Iron deficiency anemia 11/21/2018   Syncope 03/18/2018   Anemia 03/18/2018   Exophthalmos 02/22/2018   Multiple thyroid nodules 02/22/2018   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   Pain at surgical site 08/30/2016   Ventral incisional hernia 08/21/2016   SBO (small bowel obstruction) (HCC)    Partial small bowel obstruction ([place]) 04/23/2016   Adjustment disorder with mixed anxiety and depressed mood 04/23/2016   Memory difficulties 09/01/2015   Arthritis 06/11/2015   Back pain, chronic 06/11/2015   HLD (hyperlipidemia) 06/11/2015   Cannot sleep 06/11/2015   L-S radiculopathy 06/11/2015   Arthritis of knee, degenerative 06/11/2015   B12 deficiency 06/11/2015   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 [place], [place], [address] Phone: [phone]   Fax:  [phone]  Name: CATRENA VARI MRN: [id number] Date of Birth: 10-Jan-1951

## 2021-12-27 NOTE — Therapy (Signed)
[place] MAIN [facility] SERVICES [address] [place], [place], [address] Phone: [phone]   Fax:  [phone]  Physical Therapy Evaluation  Patient Details  Name: Julie Acevedo MRN: [id number] Date of Birth: September 12, 1951 No data recorded  Encounter Date: 12/27/2021    Past Medical History:  Diagnosis Date   Anemia    Arthritis    COVID-19 11/19/2019   Diverticulitis    DM (diabetes mellitus) ([place])    typ e 2   History of methicillin resistant staphylococcus aureus (MRSA) 2017   HTN (hypertension)    Hyperlipidemia    Memory difficulties 09/01/2015   Multiple thyroid nodules    Myocardial infarction ([place]) 1995   Short-term memory loss    Thyroid nodule     Past Surgical History:  Procedure Laterality Date   ABDOMINAL HYSTERECTOMY     due to endometriosis-1 ovary left   BREAST EXCISIONAL BIOPSY Right 1971   neg   CARDIAC CATHETERIZATION  2000   no significant CAD per note of Dr Caryl Comes in 2013    [place] N/A 03/19/2017   Procedure: ANTERIOR REPAIR (CYSTOCELE);  Surgeon: Bjorn Loser, MD;  [facility];  Service: Urology;  Laterality: N/A;   CYSTOSCOPY N/A 03/19/2017   Procedure: CYSTOSCOPY;  Surgeon: Bjorn Loser, MD;  [facility];  Service: Urology;  Laterality: N/A;   ESOPHAGOGASTRODUODENOSCOPY (EGD) WITH PROPOFOL N/A 12/15/2021   Procedure: ESOPHAGOGASTRODUODENOSCOPY (EGD) WITH PROPOFOL;  Surgeon: Lesly Rubenstein, MD;  Location: [facility];  Service: Endoscopy;  Laterality: N/A;   EYE SURGERY     FOOT SURGERY     HAND SURGERY     HEMICOLECTOMY     INSERTION OF MESH N/A 08/21/2016   Procedure: INSERTION OF MESH;  Surgeon: Excell Seltzer, MD;  [facility];  Service: General;  Laterality: N/A;   INSERTION OF MESH N/A 12/08/2019   Procedure: INSERTION OF MESH;  Surgeon: Jules Husbands, MD;  Location: [facility];  Service: General;  Laterality: N/A;   LAPAROSCOPIC LYSIS OF  ADHESIONS N/A 08/21/2016   Procedure: LAPAROSCOPIC LYSIS OF ADHESIONS;  Surgeon: Excell Seltzer, MD;  [facility];  Service: General;  Laterality: N/A;   LAPAROSCOPIC REMOVAL ABDOMINAL MASS     LAPAROTOMY N/A 04/16/2019   Procedure: EXPLORATORY LAPAROTOMY;  Surgeon: Benjamine Sprague, DO;  Location: [facility];  Service: General;  Laterality: N/A;   LOOP RECORDER INSERTION N/A 03/21/2018   Procedure: LOOP RECORDER INSERTION;  Surgeon: Sanda Klein, MD;  Location: [place] [facility];  Service: Cardiovascular;  Laterality: N/A;   LUMBAR DISC SURGERY     ROUX-EN-Y GASTRIC BYPASS  2010   Dr Hassell Done   SMALL INTESTINE SURGERY     TONSILLECTOMY     UPPER GI ENDOSCOPY  01/12/2016   normal larynx, normal esophagus. gastric bypass with a normal-sized pouch and intact staple line, normal examined jejunum, otherwise exam was normal   VENTRAL HERNIA REPAIR N/A 08/21/2016   Procedure: LAPAROSCOPIC VENTRAL HERNIA;  Surgeon: Excell Seltzer, MD;  [facility];  Service: General;  Laterality: N/A;   XI ROBOTIC ASSISTED VENTRAL HERNIA N/A 12/08/2019   Procedure: XI ROBOTIC ASSISTED VENTRAL HERNIA;  Surgeon: Jules Husbands, MD;  Location: [facility];  Service: General;  Laterality: N/A;    There were no vitals filed for this visit.   Subjective Assessment - 12/27/21 1001     Subjective Pt presents for PT evaluation ambulating without an AD. Pt reports  she was recently diagnosed with [name], but per her reports pt is unsure if this was 100% conclusive. Pt reports she is having difficulty with her gait and frequent falls. She reports dragging her feet and tremors in her BUEs. Pt thinks falls are due to knee buckling. While pt not using an AD today, she does report she will use a cane at times but does not think it helps prevent her falls. Pt reports she has chronic back pain, stating it has always hurt me. She reports hx of 2 back surgeries and that she also has RA. Pt reports current back  pain is 10/10.    Pertinent History Pt presents for PT evaluation ambulating without an AD. Pt reports she was recently diagnosed with [name], but per her reports pt is unsure if this was 100% conclusive. Pt reports she is having difficulty with her gait and frequent falls. She reports dragging her feet and tremors in her BUEs. Pt thinks falls are due to knee buckling. While pt not using an AD today, she does report she will use a cane at times but does not think it helps prevent her falls. Pt reports she has chronic back pain. She reports hx of 2 back surgeries and that she also has arthritis. Per chart, PMH significant for DMII, HTN, OA, chronic back pain, HLD, L-S radiculopathy, knee OA, B12 deficiency, memory difficulties, adjustment disorder with mixed anxiety and depressed mood, ventral incisional hernia, syncope, anemia, gastroesophageal reflux disease, cystitis, PTTD, TIA.    Limitations Sitting;Standing;Walking;House hold activities    How long can you sit comfortably? Pt repots <10 minutes    How long can you stand comfortably? Pt reports 5-10 minutes    How long can you walk comfortably? Pt reports pain and balance limit her walking    Diagnostic tests Per chart: "[name] 09/05/2021: IMPRESSION:  No evidence of acute intracranial abnormality.     Stable noncontrast MRI appearance of the brain as compared to  02/18/2021.  Mild chronic small-vessel ischemic changes within the cerebral white  matter.   Mild generalized cerebral atrophy. "    08/30/2021 CT HEAD WO CONTRAST: IMPRESSION:  No evidence of acute intracranial abnormality. Mild cerebral atrophy without appreciable lobar predominance, not  significantly changed from the brain MRI of 02/18/2021. Mild chronic small-vessel ischemic disease within the cerebral white  matter.    Patient Stated Goals Pt would like to improve her endurance, decrease falls    Currently in Pain? Yes    Pain Score 10-Worst pain ever    Pain  Location Back    Pain Orientation Lower    Pain Type Chronic pain    Pain Onset More than a month ago              INTERVENTIONS   SUBJECTIVE Chief complaint: see subjective for details, frequent falls, tremors and gait difficulty Referring Dx: lewy body dementia with parkinson's feature, tremor and shuffling gait Referring Provider: Mikey Kirschner, PA-C Prior history of physical therapy for balance: None Follow-up appointment with MD: Yes Dominant hand: left Falls in the last 6 months: Yes  6-7 times   OBJECTIVE  Musculoskeletal Tremor: present, most noticeable in LE UE, pt reports tremor in BUE  Posture Slight forward head posture/rounded shoulders  Gait Decreased B arm swing R>L, impaired gait speed (see 10MWT), decreased step-length   Strength R/L BLE strength grossly 4/5    NEUROLOGICAL:   Sensation Grossly intact to light touch  bilateral LEs   Coordination/Cerebellar Finger to Chin: mild impairment Heel to Shin: dififculty completing due to reported LE weakness Rapid alternating movements: WNL  FUNCTIONAL OUTCOME MEASURES   Results Comments  BERG 53/56   5TSTS 14 seconds   6 Minute Walk Test deferred   10 Meter Gait Speed 0.8 m/s Decreased R arm swing, almost absent, decreased B  ABC Scale 61.25%    FOTO: 34% (goal 55)  POSTURAL CONTROL TESTS   Modified Clinical Test of Sensory Interaction for Balance    (CTSIB):  CONDITION TIME STRATEGY SWAY  Eyes open, firm surface 30 seconds ankle Completes   Eyes closed, firm surface 30 seconds ankle Completes, slight increase in sway  Eyes open, foam surface 30 seconds ankle completes  Eyes closed, foam surface 30 seconds ankle Unable to complete    HEP initiated and reviewed with pt: Access Code: [email or web address] URL: [email or web address] Date: 12/27/2021 Prepared by: Ricard Dillon  Exercises Seated March - 1 x daily - 5 x weekly - 2 sets - 20 reps    ASSESSMENT Clinical  Impression: Pt is a pleasant 71 year-old female referred for difficulty with gait following diagnosis of [name] with Parkinson's feature. Pt also with a recent history of frequent falls. PT examination reveals deficits in pain (low back, chronic), gait, strength, balance, and mobility as indicated by history and performance on 10MWT, MMT, M-CTSIB and Berg, as well as FOTO and ABC scale. Pt will benefit from skilled PT services to address these deficits in order to improve LE strength, gait, balance, pain and mobility to decrease risk for future falls.      Objective measurements completed on examination: See above findings.       Plan - 12/27/21 1027     Clinical Impression Statement Pt is a pleasant 71 year-old female referred for difficulty with gait following diagnosis of [name] with Parkinson's feature. Pt also with a recent history of frequent falls. PT examination reveals deficits in pain (low back, chronic), gait, strength, balance, and mobility as indicated by history and performance on 10MWT, MMT, M-CTSIB and Berg, as well as FOTO and ABC scale. Pt will benefit from skilled PT services to address these deficits in order to improve LE strength, gait, balance, pain and mobility to decrease risk for future falls.    Personal Factors and Comorbidities Age;Comorbidity 3+;Sex;Past/Current Experience    Comorbidities PMH includes the following: DMII, HTN, OA, chronic back pain, HLD, L-S radiculopathy, knee OA, B12 deficiency, memory difficulties, adjustment disorder with mixed anxiety and depressed mood, ventral incisional hernia, syncope, anemia, gastroesophageal reflux disease, cystitis, PTTD, TIA.    Examination-Activity Limitations Sit;Stand;Locomotion Level;Stairs    Examination-Participation Restrictions Driving;Community Activity;Shop;Yard Work    [employer] Evolving/Moderate complexity    Clinical Decision Making Moderate    Rehab Potential  Good    PT Frequency 2x / week    PT Duration 12 weeks    PT Treatment/Interventions ADLs/Self Care Home Management;Aquatic Therapy;Biofeedback;Canalith Repostioning;Cryotherapy;Electrical Stimulation;Moist Heat;Traction;Ultrasound;Gait training;DME Instruction;Stair training;Functional mobility training;Therapeutic activities;Therapeutic exercise;Balance training;Neuromuscular re-education;Cognitive remediation;Patient/family education;Orthotic Fit/Training;Wheelchair mobility training;Manual techniques;Passive range of motion;Energy conservation;Taping;Splinting;Vestibular;Visual/perceptual remediation/compensation;Joint Manipulations;Spinal Manipulations    PT Next Visit Plan Further advance HEP, 6MWT, initiate strengthening and balance interventions    PT Home Exercise Plan 1/11: Access Code: [email or web address]    Consulted and Agree with Plan of Care Patient               PT Education - 12/27/21 1032     Education Details Exam  findings, indications for PT/POC    Person(s) Educated Patient    Methods Explanation    Comprehension Verbalized understanding              PT Short Term Goals - 12/27/21 1024       PT SHORT TERM GOAL #1   Title Pt will be independent with HEP in order to improve strength and balance in order to decrease fall risk and improve function at home and work.    Time 6    Period Weeks    Status New    Target Date 02/07/22               PT Long Term Goals - 12/27/21 1025       PT LONG TERM GOAL #1   Title Pt will improve ABC by at least 13% in order to demonstrate clinically significant improvement in balance confidence.    Time 12    Period Weeks    Status New    Target Date 03/21/22      PT LONG TERM GOAL #2   Title Pt will decrease 5TSTS by at least 3 seconds in order to demonstrate clinically significant improvement in LE strength.    Time 12    Period Weeks    Status New    Target Date 03/21/22      PT LONG TERM GOAL #3   Title Pt will  improve BERG by at least 3 points in order to demonstrate clinically significant improvement in balance.    Time 12    Period Weeks    Status New    Target Date 03/21/22      PT LONG TERM GOAL #4   Title Patient will increase FOTO score to equal to or greater than     to demonstrate statistically significant improvement in mobility and quality of life.    Time 12    Period Weeks    Status New    Target Date 03/21/22      PT LONG TERM GOAL #5   Title Pt will decrease TUG to below 14 seconds/decrease in order to demonstrate decreased fall risk.    Time 12    Period Weeks    Status New    Target Date 03/21/22                    Plan - 12/27/21 1027     Personal Factors and Comorbidities Age;Comorbidity 3+;Sex    Examination-Activity Limitations Sit;Stand;Locomotion Level;Stairs    Examination-Participation Restrictions Driving;Community Activity;Shop;Yard Work             Patient will benefit from skilled therapeutic intervention in order to improve the following deficits and impairments:  Pain, Decreased balance, Abnormal gait, Decreased activity tolerance, Decreased cognition, Decreased endurance, Decreased knowledge of use of DME, Decreased range of motion, Decreased strength, Hypomobility, Impaired UE functional use, Improper body mechanics, Decreased coordination, Decreased mobility, Difficulty walking, Postural dysfunction  Visit Diagnosis: Muscle weakness (generalized)  Unsteadiness on feet  Other abnormalities of gait and mobility  Other lack of coordination     Problem List 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, psychotic disturbance, mood disturbance, or anxiety ([place]) 11/22/2021   TIA (transient ischemic attack) 09/05/2021   Stomatitis and mucositis 07/21/2021   Cheilitis 07/21/2021   Callus of foot 01/19/2021   Pes planus 01/19/2021   PTTD (posterior tibial tendon  dysfunction) 01/19/2021  Acute flank pain 12/15/2020   Cystitis 12/15/2020   Urinary symptom or sign 12/15/2020   Low back pain radiating to right lower extremity 06/23/2020   Gastroesophageal reflux disease 04/28/2020   Large bowel obstruction ([place]) 04/17/2019   Acquired trigger finger 03/20/2019   Iron deficiency anemia 11/21/2018   Syncope 03/18/2018   Anemia 03/18/2018   Exophthalmos 02/22/2018   Multiple thyroid nodules 02/22/2018   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   Pain at surgical site 08/30/2016   Ventral incisional hernia 08/21/2016   SBO (small bowel obstruction) ([facility])    Partial small bowel obstruction ([place]) 04/23/2016   Adjustment disorder with mixed anxiety and depressed mood 04/23/2016   Memory difficulties 09/01/2015   Arthritis 06/11/2015   Back pain, chronic 06/11/2015   HLD (hyperlipidemia) 06/11/2015   Cannot sleep 06/11/2015   L-S radiculopathy 06/11/2015   Arthritis of knee, degenerative 06/11/2015   B12 deficiency 06/11/2015   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

## 2021-12-28 ENCOUNTER — Encounter: Payer: Self-pay | Admitting: Oncology

## 2021-12-29 ENCOUNTER — Encounter: Payer: Self-pay | Admitting: Oncology

## 2021-12-29 ENCOUNTER — Ambulatory Visit: Payer: Medicare Other

## 2021-12-29 ENCOUNTER — Other Ambulatory Visit: Payer: Self-pay

## 2021-12-29 DIAGNOSIS — M6281 Muscle weakness (generalized): Secondary | ICD-10-CM

## 2021-12-29 DIAGNOSIS — R2689 Other abnormalities of gait and mobility: Secondary | ICD-10-CM

## 2021-12-29 DIAGNOSIS — R2681 Unsteadiness on feet: Secondary | ICD-10-CM | POA: Diagnosis not present

## 2021-12-29 DIAGNOSIS — R278 Other lack of coordination: Secondary | ICD-10-CM

## 2021-12-29 DIAGNOSIS — F02A0 Dementia in other diseases classified elsewhere, mild, without behavioral disturbance, psychotic disturbance, mood disturbance, and anxiety: Secondary | ICD-10-CM | POA: Diagnosis not present

## 2021-12-29 NOTE — Therapy (Signed)
[place] MAIN [facility] SERVICES [address] [place], [place], [address] Phone: [phone]   Fax:  [phone]  Physical Therapy Treatment  Patient Details  Name: Julie Acevedo MRN: [id number] Date of Birth: 1951-05-10 No data recorded  Encounter Date: 12/29/2021    Past Medical History:  Diagnosis Date   Anemia    Arthritis    COVID-19 11/19/2019   Diverticulitis    DM (diabetes mellitus) ([place])    typ e 2   History of methicillin resistant staphylococcus aureus (MRSA) 2017   HTN (hypertension)    Hyperlipidemia    Memory difficulties 09/01/2015   Multiple thyroid nodules    Myocardial infarction ([place]) 1995   Short-term memory loss    Thyroid nodule     Past Surgical History:  Procedure Laterality Date   ABDOMINAL HYSTERECTOMY     due to endometriosis-1 ovary left   BREAST EXCISIONAL BIOPSY Right 1971   neg   CARDIAC CATHETERIZATION  2000   no significant CAD per note of Dr Caryl Comes in 2013    [place] N/A 03/19/2017   Procedure: ANTERIOR REPAIR (CYSTOCELE);  Surgeon: Bjorn Loser, MD;  [facility];  Service: Urology;  Laterality: N/A;   CYSTOSCOPY N/A 03/19/2017   Procedure: CYSTOSCOPY;  Surgeon: Bjorn Loser, MD;  [facility];  Service: Urology;  Laterality: N/A;   ESOPHAGOGASTRODUODENOSCOPY (EGD) WITH PROPOFOL N/A 12/15/2021   Procedure: ESOPHAGOGASTRODUODENOSCOPY (EGD) WITH PROPOFOL;  Surgeon: Lesly Rubenstein, MD;  Location: [facility];  Service: Endoscopy;  Laterality: N/A;   EYE SURGERY     FOOT SURGERY     HAND SURGERY     HEMICOLECTOMY     INSERTION OF MESH N/A 08/21/2016   Procedure: INSERTION OF MESH;  Surgeon: Excell Seltzer, MD;  [facility];  Service: General;  Laterality: N/A;   INSERTION OF MESH N/A 12/08/2019   Procedure: INSERTION OF MESH;  Surgeon: Jules Husbands, MD;  Location: [facility];  Service: General;  Laterality: N/A;   LAPAROSCOPIC LYSIS OF  ADHESIONS N/A 08/21/2016   Procedure: LAPAROSCOPIC LYSIS OF ADHESIONS;  Surgeon: Excell Seltzer, MD;  [facility];  Service: General;  Laterality: N/A;   LAPAROSCOPIC REMOVAL ABDOMINAL MASS     LAPAROTOMY N/A 04/16/2019   Procedure: EXPLORATORY LAPAROTOMY;  Surgeon: Benjamine Sprague, DO;  Location: [facility];  Service: General;  Laterality: N/A;   LOOP RECORDER INSERTION N/A 03/21/2018   Procedure: LOOP RECORDER INSERTION;  Surgeon: Sanda Klein, MD;  Location: [place] [facility];  Service: Cardiovascular;  Laterality: N/A;   LUMBAR DISC SURGERY     ROUX-EN-Y GASTRIC BYPASS  2010   Dr Hassell Done   SMALL INTESTINE SURGERY     TONSILLECTOMY     UPPER GI ENDOSCOPY  01/12/2016   normal larynx, normal esophagus. gastric bypass with a normal-sized pouch and intact staple line, normal examined jejunum, otherwise exam was normal   VENTRAL HERNIA REPAIR N/A 08/21/2016   Procedure: LAPAROSCOPIC VENTRAL HERNIA;  Surgeon: Excell Seltzer, MD;  [facility];  Service: General;  Laterality: N/A;   XI ROBOTIC ASSISTED VENTRAL HERNIA N/A 12/08/2019   Procedure: XI ROBOTIC ASSISTED VENTRAL HERNIA;  Surgeon: Jules Husbands, MD;  Location: [facility];  Service: General;  Laterality: N/A;    There were no vitals filed for this visit.   TherEx:   6MWT - 1360 ft. Pt reports she felt most limited by her back. Pt exhibited a few stumbles  where she had to use step strategy and CGA from PT to maintain balance. Pt reports she felt increased tremors in her LEs after test.  Standing hip abduction 1x15 B, 1x10 B. Pt rates as hard. Pt compensates with hip flexion and has difficulty correcting technique even with TC.   Standing marches 3x10 alternating   STS 2x10  hands-free; pt rates as medium  Standing heel raises 1x20 B  Neuro RE-ed:   Ambulating with vertical head turns 6x10 meters. Cross-over stepping, increased variability of BOS, up to min a required to maintain balance.  Ambulating with  horizontal head turns 6x10 meters. Pt exhibits cross-over stepping, step strategy, variability in BOS and up to min a required to maintain balance.  Semi-tandem walking 2x10 meters. Very challenging for pt. Pt continues to require min a and uses step strategy.   On airex, NBOS, EC 4x30-45 sec. Pt requires up to min assist and intermittent UE support on bar to regain balance.   Cone taps 10x. Pt requires up to min a and intermittent UE support on bar to maintain/regain balance. Pt with greater difficulty balancing on RLE>LLE.   Pt educated throughout session about proper posture and technique with exercises. Improved exercise technique, movement at target joints, use of target muscles after min to mod verbal, visual, tactile cues.                     PT Short Term Goals - 12/27/21 1024       PT SHORT TERM GOAL #1   Title Pt will be independent with HEP in order to improve strength and balance in order to decrease fall risk and improve function at home and work.    Baseline 1/11: initiated    Time 6    Period Weeks    Status New    Target Date 02/07/22               PT Long Term Goals - 12/27/21 1025       PT LONG TERM GOAL #1   Title Pt will improve ABC by at least 13% in order to demonstrate clinically significant improvement in balance confidence.    Baseline 1/11: 61.25%    Time 12    Period Weeks    Status New    Target Date 03/21/22      PT LONG TERM GOAL #2   Title Pt will decrease 5TSTS by at least 3 seconds in order to demonstrate clinically significant improvement in LE strength.    Baseline 1/11: 14 sec    Time 12    Period Weeks    Status New    Target Date 03/21/22      PT LONG TERM GOAL #3   Title Pt will improve BERG by at least 3 points in order to demonstrate clinically significant improvement in balance.    Baseline 1/11: 53/56, greatest difficulty with SLB    Time 12    Period Weeks    Status New    Target Date 03/21/22       PT LONG TERM GOAL #4   Title Patient will increase FOTO score to equal to or greater than 55  to demonstrate statistically significant improvement in mobility and quality of life.    Baseline 1/11: 34%    Time 12    Period Weeks    Status New    Target Date 03/21/22      PT LONG TERM GOAL #5   Title  Patient will increase 10 meter walk test to >1.2 m/s as to improve gait speed for better community ambulation and to reduce fall risk.    Baseline 1/11: 0.8 m/s    Time 12    Period Weeks    Status New    Target Date 03/21/22      Additional Long Term Goals   Additional Long Term Goals Yes      PT LONG TERM GOAL #6   Title Patient will increase six minute walk test distance to >1000 for progression to community ambulator and improve gait ability    Baseline 1/11: to be assessed next 1-2 visits    Time 12    Period Weeks    Status New    Target Date 03/21/22                    Patient will benefit from skilled therapeutic intervention in order to improve the following deficits and impairments:     Visit Diagnosis: No diagnosis found.     Problem List 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, psychotic disturbance, mood disturbance, or anxiety ([place]) 11/22/2021   TIA (transient ischemic attack) 09/05/2021   Stomatitis and mucositis 07/21/2021   Cheilitis 07/21/2021   Callus of foot 01/19/2021   Pes planus 01/19/2021   PTTD (posterior tibial tendon dysfunction) 01/19/2021   Acute flank pain 12/15/2020   Cystitis 12/15/2020   Urinary symptom or sign 12/15/2020   Low back pain radiating to right lower extremity 06/23/2020   Gastroesophageal reflux disease 04/28/2020   Large bowel obstruction ([place]) 04/17/2019   Acquired trigger finger 03/20/2019   Iron deficiency anemia 11/21/2018   Syncope 03/18/2018   Anemia 03/18/2018   Exophthalmos 02/22/2018   Multiple thyroid nodules 02/22/2018   Vaginal  vault prolapse 03/19/2017   Pain at surgical site 08/30/2016   Ventral incisional hernia 08/21/2016   SBO (small bowel obstruction) (HCC)    Partial small bowel obstruction ([place]) 04/23/2016   Adjustment disorder with mixed anxiety and depressed mood 04/23/2016   Memory difficulties 09/01/2015   Arthritis 06/11/2015   Back pain, chronic 06/11/2015   HLD (hyperlipidemia) 06/11/2015   Cannot sleep 06/11/2015   L-S radiculopathy 06/11/2015   Arthritis of knee, degenerative 06/11/2015   B12 deficiency 06/11/2015   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, [place], [address] Phone: [phone]   Fax:  [phone]  Name: Julie Acevedo MRN: [id number] Date of Birth: [birth year]

## 2021-12-29 NOTE — Therapy (Signed)
[place] MAIN [facility] SERVICES [address] [place], [place], [address] Phone: [phone]   Fax:  [phone]  Occupational Therapy Treatment  Patient Details  Name: Julie Acevedo MRN: [id number] Date of Birth: Jan 01, 1951 Referring Provider (OT): Mikey Kirschner, [place]   Encounter Date: 12/29/2021   OT End of Session - 12/29/21 0857     Visit Number 5    Number of Visits 12    Date for OT Re-Evaluation 01/09/22    Authorization Type Reporting period beginning 11/29/2021    OT Start Time 0845    OT Stop Time 0930    OT Time Calculation (min) 45 min    Activity Tolerance Patient tolerated treatment well    Behavior During Therapy [facility] for tasks assessed/performed             Past Medical History:  Diagnosis Date   Anemia    Arthritis    COVID-19 11/19/2019   Diverticulitis    DM (diabetes mellitus) ([place])    typ e 2   History of methicillin resistant staphylococcus aureus (MRSA) 2017   HTN (hypertension)    Hyperlipidemia    Memory difficulties 09/01/2015   Multiple thyroid nodules    Myocardial infarction ([place]) 1995   Short-term memory loss    Thyroid nodule     Past Surgical History:  Procedure Laterality Date   ABDOMINAL HYSTERECTOMY     due to endometriosis-1 ovary left   BREAST EXCISIONAL BIOPSY Right 1971   neg   CARDIAC CATHETERIZATION  2000   no significant CAD per note of Dr Caryl Comes in 2013    [place] N/A 03/19/2017   Procedure: ANTERIOR REPAIR (CYSTOCELE);  Surgeon: Bjorn Loser, MD;  Location: [facility];  Service: Urology;  Laterality: N/A;   CYSTOSCOPY N/A 03/19/2017   Procedure: CYSTOSCOPY;  Surgeon: Bjorn Loser, MD;  Location: [facility];  Service: Urology;  Laterality: N/A;   ESOPHAGOGASTRODUODENOSCOPY (EGD) WITH PROPOFOL N/A 12/15/2021   Procedure: ESOPHAGOGASTRODUODENOSCOPY (EGD) WITH PROPOFOL;  Surgeon: Lesly Rubenstein, MD;  Location: [facility];  Service: Endoscopy;   Laterality: N/A;   EYE SURGERY     FOOT SURGERY     HAND SURGERY     HEMICOLECTOMY     INSERTION OF MESH N/A 08/21/2016   Procedure: INSERTION OF MESH;  Surgeon: Excell Seltzer, MD;  Location: [facility];  Service: General;  Laterality: N/A;   INSERTION OF MESH N/A 12/08/2019   Procedure: INSERTION OF MESH;  Surgeon: Jules Husbands, MD;  Location: [facility];  Service: General;  Laterality: N/A;   LAPAROSCOPIC LYSIS OF ADHESIONS N/A 08/21/2016   Procedure: LAPAROSCOPIC LYSIS OF ADHESIONS;  Surgeon: Excell Seltzer, MD;  Location: [facility];  Service: General;  Laterality: N/A;   LAPAROSCOPIC REMOVAL ABDOMINAL MASS     LAPAROTOMY N/A 04/16/2019   Procedure: EXPLORATORY LAPAROTOMY;  Surgeon: Benjamine Sprague, DO;  Location: [facility];  Service: General;  Laterality: N/A;   LOOP RECORDER INSERTION N/A 03/21/2018   Procedure: LOOP RECORDER INSERTION;  Surgeon: Sanda Klein, MD;  Location: [place] [facility];  Service: Cardiovascular;  Laterality: N/A;   LUMBAR DISC SURGERY     ROUX-EN-Y GASTRIC BYPASS  2010   Dr Hassell Done   SMALL INTESTINE SURGERY     TONSILLECTOMY     UPPER GI ENDOSCOPY  01/12/2016   normal larynx, normal esophagus. gastric bypass with a normal-sized pouch and intact staple line, normal examined jejunum, otherwise exam was normal  VENTRAL HERNIA REPAIR N/A 08/21/2016   Procedure: LAPAROSCOPIC VENTRAL HERNIA;  Surgeon: Excell Seltzer, MD;  Location: [facility];  Service: General;  Laterality: N/A;   XI ROBOTIC ASSISTED VENTRAL HERNIA N/A 12/08/2019   Procedure: XI ROBOTIC ASSISTED VENTRAL HERNIA;  Surgeon: Jules Husbands, MD;  Location: [facility];  Service: General;  Laterality: N/A;    There were no vitals filed for this visit.   Subjective Assessment - 12/29/21 0855     Subjective  "I took a pain pill for my back.  It'll kick in soon."    Patient is accompanied by: Family member    Pertinent History [name], UB/LB tremors, frequent falls    Limitations STM  impairment, reports frequent hallucinations, UB/LB tremors, frequent falls    Patient Stated Goals Pt reports she wishes she didn't have tremors but is receptive to strategies to help manage self care tasks with tremors present.    Currently in Pain? Yes    Pain Score 8     Pain Location Back    Pain Orientation Lower    Pain Descriptors / Indicators Aching;Dull    Pain Type Chronic pain    Pain Onset More than a month ago    Pain Frequency Constant    Aggravating Factors  moving around    Pain Relieving Factors rest, heat, pain meds    Effect of Pain on Daily Activities limited activity tolerance for mobility    Multiple Pain Sites No           Occupational Therapy Treatment: Therapeutic Exercise: Pt participated in sitting UBE x 4 min forward rotation x 4 min reverse rotation, minimal resistance, working to increase BUE strength and activity tolerance for self care.  Facilitated hand strengthening with use of hand gripper set at 11.2# to remove jumbo pegs from pegboard x3 trials each hand.    Pt participated in [place] with use of 1.5# dowel for chest press, shoulder flexion, horiz abd/add, abd, ER, IR/extension x 2 sets 10 reps each, rest between sets and min vc for technique and posture.   Response to Treatment: See Plan/clinical impression below.   OT Education - 12/29/21 0857     Education Details BUE ther ex    Person(s) Educated Patient    Methods Explanation;Verbal cues;Demonstration;Tactile cues    Comprehension Verbalized understanding;Need further instruction;Verbal cues required;Returned demonstration              OT Short Term Goals - 11/30/21 0851       OT SHORT TERM GOAL #1   Title Pt will perform HEP for BUE strengthening and coordination training with mod vc/supv from family member.    Baseline Eval: not yet initiated    Time 3    Period Weeks    Status New    Target Date 12/19/21               OT Long Term Goals - 11/30/21 0852        OT LONG TERM GOAL #1   Title Pt will increase FOTO score to 50 or better to indicate increased functional performance.    Baseline Eval: FOTO 45    Time 6    Period Weeks    Status New    Target Date 01/09/22      OT LONG TERM GOAL #2   Title Pt will improve R shoulder strength by 1 MM grade to ease tolerance for reaching overhead with self care/IADL tasks.  Baseline Eval: R shoulder strength 3-/5    Time 6    Period Weeks    Status New    Target Date 01/09/22      OT LONG TERM GOAL #3   Title Pt will increase R grip strength by 5 or more lbs to increase tolerance for carrying/transporting heavier ADL supplies.    Baseline Eval: R grip 35 lbs (L dominant is 44 lbs)    Time 6    Period Weeks    Status New    Target Date 01/09/22      OT LONG TERM GOAL #4   Title Pt will ulitize compensatory ADL strategies and AE as needed with mod vc to reduce fall risk with self care and functional mobility.    Baseline Eval: Family making handicapped bathroom modifications next week; frequent falls with ADLs    Time 6    Period Weeks    Status New    Target Date 01/09/22      OT LONG TERM GOAL #5   Title Pt will utilize WB strategies as needed with mod vc to manage tremors during self care tasks.    Baseline Eval: not yet initiated education    Time 6    Period Weeks    Status New    Target Date 01/09/22              Plan - 12/29/21 0945     Clinical Impression Statement OT reviewed safe item transport with kitchen items, with encouragement to slide items when able on countertop rather than carrying, as pt does not have a rollator.  Also encouraged pt to seek out assist from family when necessary when tremors are more severe, especially with hot items or with items more likely to spill.  Pt is steadily tolerating increased time on UBE with min resistance and tolerating shoulder exercises well with minimal shoulder pain.  Pt reports most of her pain is in her back.  Pt will  continue to benefit from skilled OT for instruction in ADL strategies and BUE strengthening in order to maximize indep and safety with daily tasks.    OT Occupational Profile and History Problem Focused Assessment - Including review of records relating to presenting problem    Occupational performance deficits (Please refer to evaluation for details): ADL's;IADL's    Body Structure / Function / Physical Skills ADL;Coordination;GMC;UE functional use;Balance;Body mechanics;Decreased knowledge of use of DME;Flexibility;IADL;Dexterity;FMC;Strength;Gait;Mobility;ROM    Rehab Potential Good    Clinical Decision Making Several treatment options, min-mod task modification necessary    Comorbidities Affecting Occupational Performance: May have comorbidities impacting occupational performance    Modification or Assistance to Complete Evaluation  No modification of tasks or assist necessary to complete eval    OT Frequency 2x / week    OT Duration 6 weeks    OT Treatment/Interventions Self-care/ADL training;Therapeutic exercise;DME and/or AE instruction;Functional Mobility Training;Balance training;Neuromuscular education;Manual Therapy;Energy conservation;Passive range of motion;Therapeutic activities;Patient/family education    Consulted and Agree with Plan of Care Patient             Patient will benefit from skilled therapeutic intervention in order to improve the following deficits and impairments:   Body Structure / Function / Physical Skills: ADL, Coordination, GMC, UE functional use, Balance, Body mechanics, Decreased knowledge of use of DME, Flexibility, IADL, Dexterity, FMC, Strength, Gait, Mobility, ROM       Visit Diagnosis: Muscle weakness (generalized)  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, psychotic disturbance, mood  disturbance, or anxiety [facility])    Problem List Patient Active Problem List   Diagnosis Date Noted   Mild [name] without behavioral  disturbance, psychotic disturbance, mood disturbance, or anxiety ([place]) 11/22/2021   TIA (transient ischemic attack) 09/05/2021   Stomatitis and mucositis 07/21/2021   Cheilitis 07/21/2021   Callus of foot 01/19/2021   Pes planus 01/19/2021   PTTD (posterior tibial tendon dysfunction) 01/19/2021   Acute flank pain 12/15/2020   Cystitis 12/15/2020   Urinary symptom or sign 12/15/2020   Low back pain radiating to right lower extremity 06/23/2020   Gastroesophageal reflux disease 04/28/2020   Large bowel obstruction ([address]) 04/17/2019   Acquired trigger finger 03/20/2019   Iron deficiency anemia 11/21/2018   Syncope 03/18/2018   Anemia 03/18/2018   Exophthalmos 02/22/2018   Multiple thyroid nodules 02/22/2018   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   Pain at surgical site 08/30/2016   Ventral incisional hernia 08/21/2016   SBO (small bowel obstruction) ([facility])    Partial small bowel obstruction ([place]) 04/23/2016   Adjustment disorder with mixed anxiety and depressed mood 04/23/2016   Memory difficulties 09/01/2015   Arthritis 06/11/2015   Back pain, chronic 06/11/2015   HLD (hyperlipidemia) 06/11/2015   Cannot sleep 06/11/2015   L-S radiculopathy 06/11/2015   Arthritis of knee, degenerative 06/11/2015   B12 deficiency 06/11/2015   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

## 2022-01-01 ENCOUNTER — Ambulatory Visit: Payer: Medicare Other

## 2022-01-01 ENCOUNTER — Other Ambulatory Visit: Payer: Self-pay

## 2022-01-01 DIAGNOSIS — G3183 Dementia with Lewy bodies: Secondary | ICD-10-CM

## 2022-01-01 DIAGNOSIS — M6281 Muscle weakness (generalized): Secondary | ICD-10-CM | POA: Diagnosis not present

## 2022-01-01 DIAGNOSIS — R278 Other lack of coordination: Secondary | ICD-10-CM

## 2022-01-01 DIAGNOSIS — R2681 Unsteadiness on feet: Secondary | ICD-10-CM | POA: Diagnosis not present

## 2022-01-01 DIAGNOSIS — R2689 Other abnormalities of gait and mobility: Secondary | ICD-10-CM

## 2022-01-01 DIAGNOSIS — F02A0 Dementia in other diseases classified elsewhere, mild, without behavioral disturbance, psychotic disturbance, mood disturbance, and anxiety: Secondary | ICD-10-CM | POA: Diagnosis not present

## 2022-01-01 NOTE — Therapy (Signed)
[place] MAIN [facility] SERVICES [address] [place], [place], [address] Phone: [phone]   Fax:  [phone]  Physical Therapy Treatment  Patient Details  Name: Julie Acevedo MRN: [id number] Date of Birth: 22-Jul-1951 No data recorded  Encounter Date: 01/01/2022   PT End of Session - 01/01/22 1616     Visit Number 3    Number of Visits 25    Date for PT Re-Evaluation 03/21/22    PT Start Time 0319    PT Stop Time 0400    PT Time Calculation (min) 41 min    Equipment Utilized During Treatment Gait belt    Activity Tolerance Patient tolerated treatment well    Behavior During Therapy [facility] for tasks assessed/performed             Past Medical History:  Diagnosis Date   Anemia    Arthritis    COVID-19 11/19/2019   Diverticulitis    DM (diabetes mellitus) ([place])    typ e 2   History of methicillin resistant staphylococcus aureus (MRSA) 2017   HTN (hypertension)    Hyperlipidemia    Memory difficulties 09/01/2015   Multiple thyroid nodules    Myocardial infarction ([place]) 1995   Short-term memory loss    Thyroid nodule     Past Surgical History:  Procedure Laterality Date   ABDOMINAL HYSTERECTOMY     due to endometriosis-1 ovary left   BREAST EXCISIONAL BIOPSY Right 1971   neg   CARDIAC CATHETERIZATION  2000   no significant CAD per note of Dr Caryl Comes in 2013    [place] N/A 03/19/2017   Procedure: ANTERIOR REPAIR (CYSTOCELE);  Surgeon: Bjorn Loser, MD;  Location: [facility];  Service: Urology;  Laterality: N/A;   CYSTOSCOPY N/A 03/19/2017   Procedure: CYSTOSCOPY;  Surgeon: Bjorn Loser, MD;  Location: [facility];  Service: Urology;  Laterality: N/A;   ESOPHAGOGASTRODUODENOSCOPY (EGD) WITH PROPOFOL N/A 12/15/2021   Procedure: ESOPHAGOGASTRODUODENOSCOPY (EGD) WITH PROPOFOL;  Surgeon: Lesly Rubenstein, MD;  Location: [facility];  Service: Endoscopy;  Laterality: N/A;   EYE SURGERY     FOOT  SURGERY     HAND SURGERY     HEMICOLECTOMY     INSERTION OF MESH N/A 08/21/2016   Procedure: INSERTION OF MESH;  Surgeon: Excell Seltzer, MD;  Location: [facility];  Service: General;  Laterality: N/A;   INSERTION OF MESH N/A 12/08/2019   Procedure: INSERTION OF MESH;  Surgeon: Jules Husbands, MD;  Location: [facility];  Service: General;  Laterality: N/A;   LAPAROSCOPIC LYSIS OF ADHESIONS N/A 08/21/2016   Procedure: LAPAROSCOPIC LYSIS OF ADHESIONS;  Surgeon: Excell Seltzer, MD;  Location: [facility];  Service: General;  Laterality: N/A;   LAPAROSCOPIC REMOVAL ABDOMINAL MASS     LAPAROTOMY N/A 04/16/2019   Procedure: EXPLORATORY LAPAROTOMY;  Surgeon: Benjamine Sprague, DO;  Location: [facility];  Service: General;  Laterality: N/A;   LOOP RECORDER INSERTION N/A 03/21/2018   Procedure: LOOP RECORDER INSERTION;  Surgeon: Sanda Klein, MD;  Location: [place] CV LAB;  Service: Cardiovascular;  Laterality: N/A;   LUMBAR DISC SURGERY     ROUX-EN-Y GASTRIC BYPASS  2010   Dr Hassell Done   SMALL INTESTINE SURGERY     TONSILLECTOMY     UPPER GI ENDOSCOPY  01/12/2016   normal larynx, normal esophagus. gastric bypass with a normal-sized pouch and intact staple line, normal examined jejunum, otherwise exam was normal   VENTRAL HERNIA  REPAIR N/A 08/21/2016   Procedure: LAPAROSCOPIC VENTRAL HERNIA;  Surgeon: Excell Seltzer, MD;  Location: [facility];  Service: General;  Laterality: N/A;   XI ROBOTIC ASSISTED VENTRAL HERNIA N/A 12/08/2019   Procedure: XI ROBOTIC ASSISTED VENTRAL HERNIA;  Surgeon: Jules Husbands, MD;  Location: [facility];  Service: General;  Laterality: N/A;    There were no vitals filed for this visit.   Subjective Assessment - 01/01/22 1521     Subjective Pt reports no pain but soreness currently following last appointment. She reports stumbles when in her kitchen going to open the pantry.    Pertinent History Pt presents for PT evaluation ambulating without an AD. Pt reports she was recently  diagnosed with [name], but per her reports pt is unsure if this was 100% conclusive. Pt reports she is having difficulty with her gait and frequent falls. She reports dragging her feet and tremors in her BUEs. Pt thinks falls are due to knee buckling. While pt not using an AD today, she does report she will use a cane at times but does not think it helps prevent her falls. Pt reports she has chronic back pain. She reports hx of 2 back surgeries and that she also has arthritis. Per chart, PMH significant for DMII, HTN, OA, chronic back pain, HLD, L-S radiculopathy, knee OA, B12 deficiency, memory difficulties, adjustment disorder with mixed anxiety and depressed mood, ventral incisional hernia, syncope, anemia, gastroesophageal reflux disease, cystitis, PTTD, TIA.    Limitations Sitting;Standing;Walking;House hold activities    How long can you sit comfortably? Pt repots <10 minutes    How long can you stand comfortably? Pt reports 5-10 minutes    How long can you walk comfortably? Pt reports pain and balance limit her walking    Diagnostic tests Per chart: "[name] 09/05/2021: IMPRESSION:  No evidence of acute intracranial abnormality.     Stable noncontrast MRI appearance of the brain as compared to  02/18/2021.  Mild chronic small-vessel ischemic changes within the cerebral white  matter.   Mild generalized cerebral atrophy. "    08/30/2021 CT HEAD WO CONTRAST: IMPRESSION:  No evidence of acute intracranial abnormality. Mild cerebral atrophy without appreciable lobar predominance, not  significantly changed from the brain MRI of 02/18/2021. Mild chronic small-vessel ischemic disease within the cerebral white  matter.    Patient Stated Goals Pt would like to improve her endurance, decrease falls    Currently in Pain? Yes    Pain Location Leg    Pain Orientation Right;Left    Pain Descriptors / Indicators Sore    Pain Onset --           INTERVENTIONS  Neuro RE-ed:    Bend and reach with ball (performed through limited range for pain free movement due to LBP) 2x10 --progressed from WBOS to NBOS for more repetitions No LOB.   Trunk twists with ball  10x each side. No LOB/unsteadiness.  Backwards step with UE shoulder ROW to simulate opening a door (Pt used GTB) 10x each side. No LOB/unsteadiness.  Ambulating with vertical head turns 6x10 meters. Cross-over stepping, increased variability of BOS, and up to min a continue to be required to maintain balance.   Ambulating with horizontal head turns 6x10 meters. Pt exhibits cross-over stepping, step strategy, variability in BOS and up to min a required to maintain balance.   Tandem walking 1x10 meters, 1x5 meters. Very challenging for pt.  Pt exhibits increased LLE tremor and buckling,  PT assists pt into chair to prevent fall, using up to mod a  PT instructs pt to use  RW for stability with ambulation, especially out in the community and when she must ambulate for longer distances. Pt verbalized understanding.  On airex, NBOS, EC 4x30. Pt requires up to min assist and uses intermittent UE support on bar to regain balance.    Cone taps/heel taps with decreasing levels of UE support. PT had to provide mod assist for pt to sit back into chair due to tremor/knee buckling (R knee). After rest pt performed multiple reps without further knee buckling.  Standing on half foam reaching toward pictures on walls to R and L sides - 2x15  Static stand on half foam 2x30 sec; intermittent UE support and up to min a  Amb. Around PT clinic gym to OT clinic>seated with RW, CGA. No knee buckling exhibited.     Pt educated throughout session about proper posture and technique with exercises. Improved exercise technique, movement at target joints, use of target muscles after min to mod verbal, visual, tactile cues.     PT Education - 01/01/22 1615     Education Details exercise technique, body mechanics    Person(s)  Educated Patient    Methods Explanation;Demonstration;Verbal cues    Comprehension Verbalized understanding;Returned demonstration;Verbal cues required;Need further instruction              PT Short Term Goals - 12/27/21 1024       PT SHORT TERM GOAL #1   Title Pt will be independent with HEP in order to improve strength and balance in order to decrease fall risk and improve function at home and work.    Baseline 1/11: initiated    Time 6    Period Weeks    Status New    Target Date 02/07/22               PT Long Term Goals - 12/29/21 0847       PT LONG TERM GOAL #1   Title Pt will improve ABC by at least 13% in order to demonstrate clinically significant improvement in balance confidence.    Baseline 1/11: 61.25%    Time 12    Period Weeks    Status New    Target Date 03/21/22      PT LONG TERM GOAL #2   Title Pt will decrease 5TSTS by at least 3 seconds in order to demonstrate clinically significant improvement in LE strength.    Baseline 1/11: 14 sec    Time 12    Period Weeks    Status New    Target Date 03/21/22      PT LONG TERM GOAL #3   Title Pt will improve BERG by at least 3 points in order to demonstrate clinically significant improvement in balance.    Baseline 1/11: 53/56, greatest difficulty with SLB    Time 12    Period Weeks    Status New    Target Date 03/21/22      PT LONG TERM GOAL #4   Title Patient will increase FOTO score to equal to or greater than 55  to demonstrate statistically significant improvement in mobility and quality of life.    Baseline 1/11: 34%    Time 12    Period Weeks    Status New    Target Date 03/21/22      PT LONG TERM GOAL #5   Title Patient will increase 10  meter walk test to >1.2 m/s as to improve gait speed for better community ambulation and to reduce fall risk.    Baseline 1/11: 0.8 m/s    Time 12    Period Weeks    Status New    Target Date 03/21/22      PT LONG TERM GOAL #6   Title Patient will  increase six minute walk test distance to >1000 for progression to community ambulator and improve gait ability    Baseline 1/11: to be assessed next 1-2 visits; 1/13: 1360 ft    Time 12    Period Weeks    Status Achieved    Target Date 03/21/22                   Plan - 01/01/22 1616     Clinical Impression Statement Pt without LOB when performing backwards stepping and bending, reaching and trunk twist interventions. Pt overall requires min-mod a throughout session, largely due to knee buckling following increased LE tremor, likely due to LE fatigue.. PT instructed pt to use RW for safety/stability and to prevent falls. Pt verbalized understanding. The pt will benefit from further skilled PT to improve LE strength, gait and balance in order to decrease fall risk.    Personal Factors and Comorbidities Age;Comorbidity 3+;Sex;Past/Current Experience    Comorbidities PMH includes the following: DMII, HTN, OA, chronic back pain, HLD, L-S radiculopathy, knee OA, B12 deficiency, memory difficulties, adjustment disorder with mixed anxiety and depressed mood, ventral incisional hernia, syncope, anemia, gastroesophageal reflux disease, cystitis, PTTD, TIA.    Examination-Activity Limitations Sit;Stand;Locomotion Level;Stairs    Examination-Participation Restrictions Driving;Community Activity;Shop;Yard Work    [employer] Evolving/Moderate complexity    Rehab Potential Good    PT Frequency 2x / week    PT Duration 12 weeks    PT Treatment/Interventions ADLs/Self Care Home Management;Aquatic Therapy;Biofeedback;Canalith Repostioning;Cryotherapy;Electrical Stimulation;Moist Heat;Traction;Ultrasound;Gait training;DME Instruction;Stair training;Functional mobility training;Therapeutic activities;Therapeutic exercise;Balance training;Neuromuscular re-education;Cognitive remediation;Patient/family education;Orthotic Fit/Training;Wheelchair mobility training;Manual  techniques;Passive range of motion;Energy conservation;Taping;Splinting;Vestibular;Visual/perceptual remediation/compensation;Joint Manipulations;Spinal Manipulations    PT Next Visit Plan Further advance HEP, 6MWT, initiate strengthening and balance interventions    PT Home Exercise Plan 1/11: Access Code: [email or web address]    Consulted and Agree with Plan of Care Patient             Patient will benefit from skilled therapeutic intervention in order to improve the following deficits and impairments:  Pain, Decreased balance, Abnormal gait, Decreased activity tolerance, Decreased cognition, Decreased endurance, Decreased knowledge of use of DME, Decreased range of motion, Decreased strength, Hypomobility, Impaired UE functional use, Improper body mechanics, Decreased coordination, Decreased mobility, Difficulty walking, Postural dysfunction  Visit Diagnosis: Unsteadiness on feet  Muscle weakness (generalized)  Other abnormalities of gait and mobility  Other lack of coordination     Problem List 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, psychotic disturbance, mood disturbance, or anxiety ([place]) 11/22/2021   TIA (transient ischemic attack) 09/05/2021   Stomatitis and mucositis 07/21/2021   Cheilitis 07/21/2021   Callus of foot 01/19/2021   Pes planus 01/19/2021   PTTD (posterior tibial tendon dysfunction) 01/19/2021   Acute flank pain 12/15/2020   Cystitis 12/15/2020   Urinary symptom or sign 12/15/2020   Low back pain radiating to right lower extremity 06/23/2020   Gastroesophageal reflux disease 04/28/2020   Large bowel obstruction ([place]) 04/17/2019   Acquired trigger finger 03/20/2019   Iron deficiency anemia 11/21/2018  Syncope 03/18/2018   Anemia 03/18/2018   Exophthalmos 02/22/2018   Multiple thyroid nodules 02/22/2018   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   Pain at surgical site 08/30/2016   Ventral  incisional hernia 08/21/2016   SBO (small bowel obstruction) ([facility])    Partial small bowel obstruction ([place]) 04/23/2016   Adjustment disorder with mixed anxiety and depressed mood 04/23/2016   Memory difficulties 09/01/2015   Arthritis 06/11/2015   Back pain, chronic 06/11/2015   HLD (hyperlipidemia) 06/11/2015   Cannot sleep 06/11/2015   L-S radiculopathy 06/11/2015   Arthritis of knee, degenerative 06/11/2015   B12 deficiency 06/11/2015   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 [place], [place], [address] Phone: [phone]   Fax:  [phone]  Name: Julie Acevedo MRN: [id number] Date of Birth: 04-Dec-1951

## 2022-01-01 NOTE — Patient Instructions (Signed)
Visit Information It was great speaking with you today!  Please let me know if you have any questions about our visit.   Goals Addressed             This Visit's Progress    Monitor and Manage My Blood Sugar-Diabetes Type 2   On track    Timeframe:  Long-Range Goal Priority:  High Start Date:  02/27/2021                           Expected End Date: 08/30/2022                      Follow Up within 90 days   - check blood sugar at prescribed times - check blood sugar if I feel it is too high or too low    Why is this important?   Checking your blood sugar at home helps to keep it from getting very high or very low.  Writing the results in a diary or log helps the doctor know how to care for you.  Your blood sugar log should have the time, date and the results.  Also, write down the amount of insulin or other medicine that you take.  Other information, like what you ate, exercise done and how you were feeling, will also be helpful.     Notes:         Patient Care Plan: General Pharmacy (Adult)     Problem Identified: Hypertension, Hyperlipidemia, Diabetes, GERD and Chronic Pain   Priority: High     Long-Range Goal: Patient-Specific Goal   Start Date: 02/27/2021  Expected End Date: 09/19/2022  This Visit's Progress: On track  Recent Progress: On track  Priority: High  Note:   Current Barriers:  Unable to achieve control of cholesterol  Suboptimal therapeutic regimen for hypertension  Pharmacist Clinical Goal(s):  patient will achieve control of cholesterol as evidenced by LDL less than 70 maintain control of blood pressure as evidenced by BP less than 140/90 and improvement of dizziness symptoms  through collaboration with PharmD and provider.   Interventions: 1:1 collaboration with Jerrol Banana., MD regarding development and update of comprehensive plan of care as evidenced by provider attestation and co-signature Inter-disciplinary care team collaboration  (see longitudinal plan of care) Comprehensive medication review performed; medication list updated in electronic medical record  Hypertension (BP goal <140/90) -Not ideally controlled -Current treatment:   Metoprolol XL 25 mg daily  -Medications previously tried: HCTZ (Hypotension)  -Current home readings: 90/60, 86/68, 139/82 -Current dietary habits: Patient is not following any specific dietary regimen -Current exercise habits: Patient is not following any specific exerciseregimen -Reports hypotensive symptoms -Patient reports frequent dizziness, especially when bending over.  -Continue current medications  Hyperlipidemia: (LDL goal < 70) -History of MI 1999 -History of TIA Sep 2022 -Uncontrolled -Current treatment: Atorvastatin 40 mg daily -Medications previously tried: NA  -Continue current medications   Diabetes (A1c goal <7%) -Controlled -Current medications: Metformin 500 mg daily  -Medications previously tried: NA  -Current home glucose readings fasting glucose: 76,  -Denies hypoglycemic/hyperglycemic symptoms -Educated on Benefits of routine self-monitoring of blood sugar; Carbohydrate counting and/or plate method -Continue current medications  Dementia (Goal: Prevent further decline in memory) -Managed by Dr. [name] -Uncontrolled -Current treatment  Rivastigmine 3 mg twice daily  -Medications previously tried: NA -Stopped  -Continue current medications  Patient Goals/Self-Care Activities patient will:  - check blood pressure  2-3 times weekly, document, and provide at future appointments target a minimum of 150 minutes of moderate intensity exercise weekly  Follow Up Plan: Telephone follow up appointment with care management team member scheduled for:  03/26/2022 at 8:30 AM    Patient agreed to services and verbal consent obtained.   The patient verbalized understanding of instructions, educational materials, and care plan provided today and declined offer  to receive copy of patient instructions, educational materials, and care plan.   [name], PharmD, [place], CPP  Clinical Pharmacist Practitioner  [facility] [phone]

## 2022-01-01 NOTE — Therapy (Signed)
[place] MAIN [facility] SERVICES [address] [place], [place], [address] Phone: [phone]   Fax:  [phone]  Occupational Therapy Treatment  Patient Details  Name: Julie Acevedo MRN: [id number] Date of Birth: May 21, 1951 Referring Provider (OT): Mikey Kirschner, [place]   Encounter Date: 01/01/2022   OT End of Session - 01/01/22 1605     Visit Number 6    Number of Visits 12    Date for OT Re-Evaluation 01/09/22    Authorization Type Reporting period beginning 11/29/2021    OT Start Time 72    OT Stop Time 1645    OT Time Calculation (min) 45 min    Activity Tolerance Patient tolerated treatment well    Behavior During Therapy [facility] for tasks assessed/performed             Past Medical History:  Diagnosis Date   Anemia    Arthritis    COVID-19 11/19/2019   Diverticulitis    DM (diabetes mellitus) ([place])    typ e 2   History of methicillin resistant staphylococcus aureus (MRSA) 2017   HTN (hypertension)    Hyperlipidemia    Memory difficulties 09/01/2015   Multiple thyroid nodules    Myocardial infarction ([place]) 1995   Short-term memory loss    Thyroid nodule     Past Surgical History:  Procedure Laterality Date   ABDOMINAL HYSTERECTOMY     due to endometriosis-1 ovary left   BREAST EXCISIONAL BIOPSY Right 1971   neg   CARDIAC CATHETERIZATION  2000   no significant CAD per note of Dr Caryl Comes in 2013    [place] N/A 03/19/2017   Procedure: ANTERIOR REPAIR (CYSTOCELE);  Surgeon: Bjorn Loser, MD;  [facility];  Service: Urology;  Laterality: N/A;   CYSTOSCOPY N/A 03/19/2017   Procedure: CYSTOSCOPY;  Surgeon: Bjorn Loser, MD;  [facility];  Service: Urology;  Laterality: N/A;   ESOPHAGOGASTRODUODENOSCOPY (EGD) WITH PROPOFOL N/A 12/15/2021   Procedure: ESOPHAGOGASTRODUODENOSCOPY (EGD) WITH PROPOFOL;  Surgeon: Lesly Rubenstein, MD;  Location: [facility];  Service: Endoscopy;   Laterality: N/A;   EYE SURGERY     FOOT SURGERY     HAND SURGERY     HEMICOLECTOMY     INSERTION OF MESH N/A 08/21/2016   Procedure: INSERTION OF MESH;  Surgeon: Excell Seltzer, MD;  [facility];  Service: General;  Laterality: N/A;   INSERTION OF MESH N/A 12/08/2019   Procedure: INSERTION OF MESH;  Surgeon: Jules Husbands, MD;  Location: [facility];  Service: General;  Laterality: N/A;   LAPAROSCOPIC LYSIS OF ADHESIONS N/A 08/21/2016   Procedure: LAPAROSCOPIC LYSIS OF ADHESIONS;  Surgeon: Excell Seltzer, MD;  [facility];  Service: General;  Laterality: N/A;   LAPAROSCOPIC REMOVAL ABDOMINAL MASS     LAPAROTOMY N/A 04/16/2019   Procedure: EXPLORATORY LAPAROTOMY;  Surgeon: Benjamine Sprague, DO;  Location: [facility];  Service: General;  Laterality: N/A;   LOOP RECORDER INSERTION N/A 03/21/2018   Procedure: LOOP RECORDER INSERTION;  Surgeon: Sanda Klein, MD;  Location: [facility];  Service: Cardiovascular;  Laterality: N/A;   LUMBAR DISC SURGERY     ROUX-EN-Y GASTRIC BYPASS  2010   Dr Hassell Done   SMALL INTESTINE SURGERY     TONSILLECTOMY     UPPER GI ENDOSCOPY  01/12/2016   normal larynx, normal esophagus. gastric bypass with a normal-sized pouch and intact staple line, normal examined jejunum, otherwise exam was normal  VENTRAL HERNIA REPAIR N/A 08/21/2016   Procedure: LAPAROSCOPIC VENTRAL HERNIA;  Surgeon: Excell Seltzer, MD;  [facility];  Service: General;  Laterality: N/A;   XI ROBOTIC ASSISTED VENTRAL HERNIA N/A 12/08/2019   Procedure: XI ROBOTIC ASSISTED VENTRAL HERNIA;  Surgeon: Jules Husbands, MD;  Location: [facility];  Service: General;  Laterality: N/A;    There were no vitals filed for this visit.   Subjective Assessment - 01/01/22 1604     Subjective  Pt reports it was really cold this weeeknd. Plan for foot surgery at the end of this month."    Pertinent History [name], UB/LB tremors, frequent falls    Limitations STM impairment,  reports frequent hallucinations, UB/LB tremors, frequent falls    Patient Stated Goals Pt reports she wishes she didn't have tremors but is receptive to strategies to help manage self care tasks with tremors present.    Currently in Pain? Yes    Pain Score 7     Pain Location Back    Pain Orientation Lower    Pain Descriptors / Indicators Aching    Pain Type Chronic pain    Pain Onset More than a month ago               Therapeutic Exercise Pt worked on UE strengthening using UBE in the forward motion 5 min at min/mod resistance and backward 5 min at min resistance in sitting. Constant attendance was provided for hand position and to adjust resistance. Pt performed 2# dowel ex for BUE strengthening secondary to weakness. Bilateral shoulder flexion, chest press, circular patterns, and elbow flexion/extension 20 reps x 1 set each. Attempted scapular retraction however increased back pain noted and terminated reps.   Self Care PT reported severe tremors during session causing buckling, reinforced requesting assistance as needed at home and in community. Pt assisted with car transfer, SBA for safety.                 OT Education - 01/01/22 1605     Education Details BUE ther ex    Person(s) Educated Patient    Methods Explanation;Verbal cues;Demonstration;Tactile cues    Comprehension Verbalized understanding;Need further instruction;Verbal cues required;Returned demonstration              OT Short Term Goals - 11/30/21 0851       OT SHORT TERM GOAL #1   Title Pt will perform HEP for BUE strengthening and coordination training with mod vc/supv from family member.    Baseline Eval: not yet initiated    Time 3    Period Weeks    Status New    Target Date 12/19/21               OT Long Term Goals - 11/30/21 0852       OT LONG TERM GOAL #1   Title Pt will increase FOTO score to 50 or better to indicate increased functional performance.    Baseline  Eval: FOTO 45    Time 6    Period Weeks    Status New    Target Date 01/09/22      OT LONG TERM GOAL #2   Title Pt will improve R shoulder strength by 1 MM grade to ease tolerance for reaching overhead with self care/IADL tasks.    Baseline Eval: R shoulder strength 3-/5    Time 6    Period Weeks    Status New    Target Date 01/09/22  OT LONG TERM GOAL #3   Title Pt will increase R grip strength by 5 or more lbs to increase tolerance for carrying/transporting heavier ADL supplies.    Baseline Eval: R grip 35 lbs (L dominant is 44 lbs)    Time 6    Period Weeks    Status New    Target Date 01/09/22      OT LONG TERM GOAL #4   Title Pt will ulitize compensatory ADL strategies and AE as needed with mod vc to reduce fall risk with self care and functional mobility.    Baseline Eval: Family making handicapped bathroom modifications next week; frequent falls with ADLs    Time 6    Period Weeks    Status New    Target Date 01/09/22      OT LONG TERM GOAL #5   Title Pt will utilize WB strategies as needed with mod vc to manage tremors during self care tasks.    Baseline Eval: not yet initiated education    Time 6    Period Weeks    Status New    Target Date 01/09/22                   Plan - 01/01/22 1608     Clinical Impression Statement PT reported severe tremors during session causing buckling, reinforced requesting assistance as needed at home and in community. Pt assisted with car transfer, CGA for safety. Tolerated increased time on UBE 10 min with min-mod resistance and increased weight for dowel exercises. Pt reports most of her pain is in her back.  Pt will continue to benefit from skilled OT for instruction in ADL strategies and BUE strengthening in order to maximize indep and safety with daily tasks.    OT Occupational Profile and History Problem Focused Assessment - Including review of records relating to presenting problem    Occupational performance  deficits (Please refer to evaluation for details): ADL's;IADL's    Body Structure / Function / Physical Skills ADL;Coordination;GMC;UE functional use;Balance;Body mechanics;Decreased knowledge of use of DME;Flexibility;IADL;Dexterity;FMC;Strength;Gait;Mobility;ROM    Rehab Potential Good    Clinical Decision Making Several treatment options, min-mod task modification necessary    Comorbidities Affecting Occupational Performance: May have comorbidities impacting occupational performance    Modification or Assistance to Complete Evaluation  No modification of tasks or assist necessary to complete eval    OT Frequency 2x / week    OT Duration 6 weeks    OT Treatment/Interventions Self-care/ADL training;Therapeutic exercise;DME and/or AE instruction;Functional Mobility Training;Balance training;Neuromuscular education;Manual Therapy;Energy conservation;Passive range of motion;Therapeutic activities;Patient/family education    Consulted and Agree with Plan of Care Patient             Patient will benefit from skilled therapeutic intervention in order to improve the following deficits and impairments:   Body Structure / Function / Physical Skills: ADL, Coordination, GMC, UE functional use, Balance, Body mechanics, Decreased knowledge of use of DME, Flexibility, IADL, Dexterity, FMC, Strength, Gait, Mobility, ROM       Visit Diagnosis: Other lack of coordination  Muscle weakness (generalized)  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, psychotic disturbance, mood disturbance, or anxiety (HCC)    Problem List 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, psychotic disturbance, mood disturbance, or anxiety ([place]) 11/22/2021   TIA (transient ischemic attack) 09/05/2021   Stomatitis and mucositis 07/21/2021   Cheilitis 07/21/2021   Callus of foot  01/19/2021   Pes planus 01/19/2021   PTTD (posterior tibial tendon  dysfunction) 01/19/2021   Acute flank pain 12/15/2020   Cystitis 12/15/2020   Urinary symptom or sign 12/15/2020   Low back pain radiating to right lower extremity 06/23/2020   Gastroesophageal reflux disease 04/28/2020   Large bowel obstruction ([place]) 04/17/2019   Acquired trigger finger 03/20/2019   Iron deficiency anemia 11/21/2018   Syncope 03/18/2018   Anemia 03/18/2018   Exophthalmos 02/22/2018   Multiple thyroid nodules 02/22/2018   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   Pain at surgical site 08/30/2016   Ventral incisional hernia 08/21/2016   SBO (small bowel obstruction) (HCC)    Partial small bowel obstruction ([place]) 04/23/2016   Adjustment disorder with mixed anxiety and depressed mood 04/23/2016   Memory difficulties 09/01/2015   Arthritis 06/11/2015   Back pain, chronic 06/11/2015   HLD (hyperlipidemia) 06/11/2015   Cannot sleep 06/11/2015   L-S radiculopathy 06/11/2015   Arthritis of knee, degenerative 06/11/2015   B12 deficiency 06/11/2015   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 OTR/L  01/01/22, 4:22 PM  ascom [phone]  [place] MAIN [facility] SERVICES [address] [place], [place], [address] Phone: [phone]   Fax:  [phone]  Name: Julie Acevedo MRN: [id number] Date of Birth: May 07, 1951

## 2022-01-03 ENCOUNTER — Other Ambulatory Visit: Payer: Self-pay

## 2022-01-03 ENCOUNTER — Ambulatory Visit: Payer: Medicare Other

## 2022-01-03 DIAGNOSIS — R2681 Unsteadiness on feet: Secondary | ICD-10-CM

## 2022-01-03 DIAGNOSIS — M6281 Muscle weakness (generalized): Secondary | ICD-10-CM | POA: Diagnosis not present

## 2022-01-03 DIAGNOSIS — R278 Other lack of coordination: Secondary | ICD-10-CM | POA: Diagnosis not present

## 2022-01-03 DIAGNOSIS — R2689 Other abnormalities of gait and mobility: Secondary | ICD-10-CM | POA: Diagnosis not present

## 2022-01-03 DIAGNOSIS — F02A0 Dementia in other diseases classified elsewhere, mild, without behavioral disturbance, psychotic disturbance, mood disturbance, and anxiety: Secondary | ICD-10-CM | POA: Diagnosis not present

## 2022-01-03 NOTE — Therapy (Signed)
[place] MAIN [facility] SERVICES [address] [place], [place], [address] Phone: [phone]   Fax:  [phone]  Physical Therapy Treatment  Patient Details  Name: Julie Acevedo MRN: [id number] Date of Birth: 10/16/1951 No data recorded  Encounter Date: 01/03/2022   PT End of Session - 01/03/22 1238     Visit Number 4    Number of Visits 25    Date for PT Re-Evaluation 03/21/22    PT Start Time 69    PT Stop Time 3716    PT Time Calculation (min) 42 min    Equipment Utilized During Treatment Gait belt    Activity Tolerance Patient tolerated treatment well    Behavior During Therapy [facility] for tasks assessed/performed             Past Medical History:  Diagnosis Date   Anemia    Arthritis    COVID-19 11/19/2019   Diverticulitis    DM (diabetes mellitus) ([place])    typ e 2   History of methicillin resistant staphylococcus aureus (MRSA) 2017   HTN (hypertension)    Hyperlipidemia    Memory difficulties 09/01/2015   Multiple thyroid nodules    Myocardial infarction ([place]) 1995   Short-term memory loss    Thyroid nodule     Past Surgical History:  Procedure Laterality Date   ABDOMINAL HYSTERECTOMY     due to endometriosis-1 ovary left   BREAST EXCISIONAL BIOPSY Right 1971   neg   CARDIAC CATHETERIZATION  2000   no significant CAD per note of Dr Caryl Comes in 2013    [place] N/A 03/19/2017   Procedure: ANTERIOR REPAIR (CYSTOCELE);  Surgeon: Bjorn Loser, MD;  [facility];  Service: Urology;  Laterality: N/A;   CYSTOSCOPY N/A 03/19/2017   Procedure: CYSTOSCOPY;  Surgeon: Bjorn Loser, MD;  [facility];  Service: Urology;  Laterality: N/A;   ESOPHAGOGASTRODUODENOSCOPY (EGD) WITH PROPOFOL N/A 12/15/2021   Procedure: ESOPHAGOGASTRODUODENOSCOPY (EGD) WITH PROPOFOL;  Surgeon: Lesly Rubenstein, MD;  Location: [facility];  Service: Endoscopy;  Laterality: N/A;   EYE SURGERY     FOOT  SURGERY     HAND SURGERY     HEMICOLECTOMY     INSERTION OF MESH N/A 08/21/2016   Procedure: INSERTION OF MESH;  Surgeon: Excell Seltzer, MD;  [facility];  Service: General;  Laterality: N/A;   INSERTION OF MESH N/A 12/08/2019   Procedure: INSERTION OF MESH;  Surgeon: Jules Husbands, MD;  Location: [facility];  Service: General;  Laterality: N/A;   LAPAROSCOPIC LYSIS OF ADHESIONS N/A 08/21/2016   Procedure: LAPAROSCOPIC LYSIS OF ADHESIONS;  Surgeon: Excell Seltzer, MD;  [facility];  Service: General;  Laterality: N/A;   LAPAROSCOPIC REMOVAL ABDOMINAL MASS     LAPAROTOMY N/A 04/16/2019   Procedure: EXPLORATORY LAPAROTOMY;  Surgeon: Benjamine Sprague, DO;  Location: [facility];  Service: General;  Laterality: N/A;   LOOP RECORDER INSERTION N/A 03/21/2018   Procedure: LOOP RECORDER INSERTION;  Surgeon: Sanda Klein, MD;  Location: [facility];  Service: Cardiovascular;  Laterality: N/A;   LUMBAR DISC SURGERY     ROUX-EN-Y GASTRIC BYPASS  2010   Dr Hassell Done   SMALL INTESTINE SURGERY     TONSILLECTOMY     UPPER GI ENDOSCOPY  01/12/2016   normal larynx, normal esophagus. gastric bypass with a normal-sized pouch and intact staple line, normal examined jejunum, otherwise exam was normal   VENTRAL HERNIA  REPAIR N/A 08/21/2016   Procedure: LAPAROSCOPIC VENTRAL HERNIA;  Surgeon: Excell Seltzer, MD;  [facility];  Service: General;  Laterality: N/A;   XI ROBOTIC ASSISTED VENTRAL HERNIA N/A 12/08/2019   Procedure: XI ROBOTIC ASSISTED VENTRAL HERNIA;  Surgeon: Jules Husbands, MD;  Location: [facility];  Service: General;  Laterality: N/A;    There were no vitals filed for this visit.  INTERVENTIONS   Neuro RE-ed: Gait belt donned and CGA provided throughout unless otherwise specified.  Performed in // bars:   NBOS EC 60 sec, no LOB  NBOS EC semi-tandem 2x30 sec each LE; intermittent UE support  [place] taps/heel taps with decreasing levels of UE support 2x15 each LE. Pt  reports R side more challenging than L side. Pt requires intermittent UE support.   Ambulating with vertical head turns 6x forward/backward in // bars. Cross-over stepping, increased variability of BOS, and up to min a continue to be required to maintain balance.   Ambulating with horizontal head turns 6x1 forward/backward in // bars. Pt exhibits cross-over stepping, step strategy, variability in BOS and up to min a required to maintain balance.   Tandem walking 2x10 meters. Pt rates as medium today. Pt only demo's one instance of requiring step strategy to regain balance.   TherEx-  Standing hip abduction 2x10 B. Pt rates a medium. Pt did have to stop at one point due to LE tremor.   STS 2x10  hands-free; pt rates as medium  Standing marches 2x10 alternating   Standing heel raises 3x10; pt rates as medium      Pt educated throughout session about proper posture and technique with exercises. Improved exercise technique, movement at target joints, use of target muscles after min to mod verbal, visual, tactile cues.     PT Short Term Goals - 12/27/21 1024       PT SHORT TERM GOAL #1   Title Pt will be independent with HEP in order to improve strength and balance in order to decrease fall risk and improve function at home and work.    Baseline 1/11: initiated    Time 6    Period Weeks    Status New    Target Date 02/07/22               PT Long Term Goals - 12/29/21 0847       PT LONG TERM GOAL #1   Title Pt will improve ABC by at least 13% in order to demonstrate clinically significant improvement in balance confidence.    Baseline 1/11: 61.25%    Time 12    Period Weeks    Status New    Target Date 03/21/22      PT LONG TERM GOAL #2   Title Pt will decrease 5TSTS by at least 3 seconds in order to demonstrate clinically significant improvement in LE strength.    Baseline 1/11: 14 sec    Time 12    Period Weeks    Status New    Target Date 03/21/22      PT  LONG TERM GOAL #3   Title Pt will improve BERG by at least 3 points in order to demonstrate clinically significant improvement in balance.    Baseline 1/11: 53/56, greatest difficulty with SLB    Time 12    Period Weeks    Status New    Target Date 03/21/22      PT LONG TERM GOAL #4   Title Patient will  increase FOTO score to equal to or greater than 55  to demonstrate statistically significant improvement in mobility and quality of life.    Baseline 1/11: 34%    Time 12    Period Weeks    Status New    Target Date 03/21/22      PT LONG TERM GOAL #5   Title Patient will increase 10 meter walk test to >1.2 m/s as to improve gait speed for better community ambulation and to reduce fall risk.    Baseline 1/11: 0.8 m/s    Time 12    Period Weeks    Status New    Target Date 03/21/22      PT LONG TERM GOAL #6   Title Patient will increase six minute walk test distance to >1000 for progression to community ambulator and improve gait ability    Baseline 1/11: to be assessed next 1-2 visits; 1/13: 1360 ft    Time 12    Period Weeks    Status Achieved    Target Date 03/21/22                   Plan - 01/03/22 1238     Clinical Impression Statement PT instructed pt to use handrails with stairs/steps as pt reported fall yesterday on stairs where she didn't use the handrails. PT also instructed pt to bring RW to next session. Pt verbalized understanding but will likely require repeat instruction. Pt did exhibit fewer instances of LE tremor today, but PT continued to educate pt on importance of use of AD to decrease fall risk. The pt required CGA-min a for majority of balance interventions. She did show improvement with semi-tandem walking this session with only one instance of using step-strategy to regain balance. The pt will benefit from further skilled PT to improve LE strength, balance nad gait to decrease fall risk.    Personal Factors and Comorbidities Age;Comorbidity  3+;Sex;Past/Current Experience    Comorbidities PMH includes the following: DMII, HTN, OA, chronic back pain, HLD, L-S radiculopathy, knee OA, B12 deficiency, memory difficulties, adjustment disorder with mixed anxiety and depressed mood, ventral incisional hernia, syncope, anemia, gastroesophageal reflux disease, cystitis, PTTD, TIA.    Examination-Activity Limitations Sit;Stand;Locomotion Level;Stairs    Examination-Participation Restrictions Driving;Community Activity;Shop;Yard Work    [employer] Evolving/Moderate complexity    Rehab Potential Good    PT Frequency 2x / week    PT Duration 12 weeks    PT Treatment/Interventions ADLs/Self Care Home Management;Aquatic Therapy;Biofeedback;Canalith Repostioning;Cryotherapy;Electrical Stimulation;Moist Heat;Traction;Ultrasound;Gait training;DME Instruction;Stair training;Functional mobility training;Therapeutic activities;Therapeutic exercise;Balance training;Neuromuscular re-education;Cognitive remediation;Patient/family education;Orthotic Fit/Training;Wheelchair mobility training;Manual techniques;Passive range of motion;Energy conservation;Taping;Splinting;Vestibular;Visual/perceptual remediation/compensation;Joint Manipulations;Spinal Manipulations    PT Next Visit Plan Further advance HEP, 6MWT, initiate strengthening and balance interventions    PT Home Exercise Plan 1/11: Access Code: [email or web address]    Consulted and Agree with Plan of Care Patient             Patient will benefit from skilled therapeutic intervention in order to improve the following deficits and impairments:  Pain, Decreased balance, Abnormal gait, Decreased activity tolerance, Decreased cognition, Decreased endurance, Decreased knowledge of use of DME, Decreased range of motion, Decreased strength, Hypomobility, Impaired UE functional use, Improper body mechanics, Decreased coordination, Decreased mobility, Difficulty walking, Postural dysfunction  Visit  Diagnosis: Unsteadiness on feet  Muscle weakness (generalized)  Other abnormalities of gait and mobility  Other lack of coordination     Problem List Patient Active Problem List   Diagnosis Date Noted  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, psychotic disturbance, mood disturbance, or anxiety ([place]) 11/22/2021   TIA (transient ischemic attack) 09/05/2021   Stomatitis and mucositis 07/21/2021   Cheilitis 07/21/2021   Callus of foot 01/19/2021   Pes planus 01/19/2021   PTTD (posterior tibial tendon dysfunction) 01/19/2021   Acute flank pain 12/15/2020   Cystitis 12/15/2020   Urinary symptom or sign 12/15/2020   Low back pain radiating to right lower extremity 06/23/2020   Gastroesophageal reflux disease 04/28/2020   Large bowel obstruction ([place]) 04/17/2019   Acquired trigger finger 03/20/2019   Iron deficiency anemia 11/21/2018   Syncope 03/18/2018   Anemia 03/18/2018   Exophthalmos 02/22/2018   Multiple thyroid nodules 02/22/2018   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   Pain at surgical site 08/30/2016   Ventral incisional hernia 08/21/2016   SBO (small bowel obstruction) (HCC)    Partial small bowel obstruction ([place]) 04/23/2016   Adjustment disorder with mixed anxiety and depressed mood 04/23/2016   Memory difficulties 09/01/2015   Arthritis 06/11/2015   Back pain, chronic 06/11/2015   HLD (hyperlipidemia) 06/11/2015   Cannot sleep 06/11/2015   L-S radiculopathy 06/11/2015   Arthritis of knee, degenerative 06/11/2015   B12 deficiency 06/11/2015   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 [place], [place], [address] Phone: [phone]   Fax:  [phone]  Name: KATHERINNE MOFIELD MRN: [id number] Date of Birth: 1951/03/18

## 2022-01-04 DIAGNOSIS — R251 Tremor, unspecified: Secondary | ICD-10-CM | POA: Diagnosis not present

## 2022-01-04 DIAGNOSIS — R296 Repeated falls: Secondary | ICD-10-CM | POA: Diagnosis not present

## 2022-01-04 DIAGNOSIS — R29898 Other symptoms and signs involving the musculoskeletal system: Secondary | ICD-10-CM | POA: Diagnosis not present

## 2022-01-04 NOTE — Therapy (Signed)
[place] MAIN [facility] SERVICES [address] [place], [place], [address] Phone: [phone]   Fax:  [phone]  Occupational Therapy Treatment  Patient Details  Name: Julie Acevedo MRN: [id number] Date of Birth: 1951-08-18 Referring Provider (OT): Mikey Kirschner, [place]   Encounter Date: 01/03/2022   OT End of Session - 01/04/22 0825     Visit Number 7    Number of Visits 12    Date for OT Re-Evaluation 01/09/22    Authorization Type Reporting period beginning 11/29/2021    OT Start Time 1145    OT Stop Time 69    OT Time Calculation (min) 45 min    Activity Tolerance Patient tolerated treatment well    Behavior During Therapy [facility] for tasks assessed/performed             Past Medical History:  Diagnosis Date   Anemia    Arthritis    COVID-19 11/19/2019   Diverticulitis    DM (diabetes mellitus) ([place])    typ e 2   History of methicillin resistant staphylococcus aureus (MRSA) 2017   HTN (hypertension)    Hyperlipidemia    Memory difficulties 09/01/2015   Multiple thyroid nodules    Myocardial infarction ([place]) 1995   Short-term memory loss    Thyroid nodule     Past Surgical History:  Procedure Laterality Date   ABDOMINAL HYSTERECTOMY     due to endometriosis-1 ovary left   BREAST EXCISIONAL BIOPSY Right 1971   neg   CARDIAC CATHETERIZATION  2000   no significant CAD per note of Dr Caryl Comes in 2013    [place] N/A 03/19/2017   Procedure: ANTERIOR REPAIR (CYSTOCELE);  Surgeon: Bjorn Loser, MD;  Location: [facility];  Service: Urology;  Laterality: N/A;   CYSTOSCOPY N/A 03/19/2017   Procedure: CYSTOSCOPY;  Surgeon: Bjorn Loser, MD;  Location: [facility];  Service: Urology;  Laterality: N/A;   ESOPHAGOGASTRODUODENOSCOPY (EGD) WITH PROPOFOL N/A 12/15/2021   Procedure: ESOPHAGOGASTRODUODENOSCOPY (EGD) WITH PROPOFOL;  Surgeon: Lesly Rubenstein, MD;  Location: [facility];  Service: Endoscopy;   Laterality: N/A;   EYE SURGERY     FOOT SURGERY     HAND SURGERY     HEMICOLECTOMY     INSERTION OF MESH N/A 08/21/2016   Procedure: INSERTION OF MESH;  Surgeon: Excell Seltzer, MD;  Location: [facility];  Service: General;  Laterality: N/A;   INSERTION OF MESH N/A 12/08/2019   Procedure: INSERTION OF MESH;  Surgeon: Jules Husbands, MD;  Location: [facility];  Service: General;  Laterality: N/A;   LAPAROSCOPIC LYSIS OF ADHESIONS N/A 08/21/2016   Procedure: LAPAROSCOPIC LYSIS OF ADHESIONS;  Surgeon: Excell Seltzer, MD;  Location: [facility];  Service: General;  Laterality: N/A;   LAPAROSCOPIC REMOVAL ABDOMINAL MASS     LAPAROTOMY N/A 04/16/2019   Procedure: EXPLORATORY LAPAROTOMY;  Surgeon: Benjamine Sprague, DO;  Location: [facility];  Service: General;  Laterality: N/A;   LOOP RECORDER INSERTION N/A 03/21/2018   Procedure: LOOP RECORDER INSERTION;  Surgeon: Sanda Klein, MD;  Location: [place] [facility];  Service: Cardiovascular;  Laterality: N/A;   LUMBAR DISC SURGERY     ROUX-EN-Y GASTRIC BYPASS  2010   Dr Hassell Done   SMALL INTESTINE SURGERY     TONSILLECTOMY     UPPER GI ENDOSCOPY  01/12/2016   normal larynx, normal esophagus. gastric bypass with a normal-sized pouch and intact staple line, normal examined jejunum, otherwise exam was normal  VENTRAL HERNIA REPAIR N/A 08/21/2016   Procedure: LAPAROSCOPIC VENTRAL HERNIA;  Surgeon: Excell Seltzer, MD;  Location: [facility];  Service: General;  Laterality: N/A;   XI ROBOTIC ASSISTED VENTRAL HERNIA N/A 12/08/2019   Procedure: XI ROBOTIC ASSISTED VENTRAL HERNIA;  Surgeon: Jules Husbands, MD;  Location: [facility];  Service: General;  Laterality: N/A;    There were no vitals filed for this visit.   Subjective Assessment - 01/03/22 0824     Subjective  "I'm so sick of these tremors."    Patient is accompanied by: Family member    Pertinent History [name], UB/LB tremors, frequent falls    Limitations STM impairment, reports frequent  hallucinations, UB/LB tremors, frequent falls    Patient Stated Goals Pt reports she wishes she didn't have tremors but is receptive to strategies to help manage self care tasks with tremors present.    Currently in Pain? Yes    Pain Score 7     Pain Location Back    Pain Orientation Lower    Pain Descriptors / Indicators Sore    Pain Type Chronic pain    Pain Onset More than a month ago    Pain Frequency Constant    Aggravating Factors  moving around    Pain Relieving Factors rest, heat, pain meds    Effect of Pain on Daily Activities limited activity tolerance for mobility    Multiple Pain Sites No           Occupational Therapy Treatment: Therapeutic Exercise: Facilitated pinch strengthening with use of therapy resistant clothespins to target lateral and 3 point pinch of R/L hand.  Used all colors and facilitated forward and lateral reaching to clip pins onto vertical targets for each hand.  1 trial each hand for each pinch type noted above.  Rest breaks between sets.  Facilitated grip strengthening with use of moderate resistance (1 green band) with hand gripper to complete 3 sets 10 reps R/L.  Pt participated in sitting UBE x 4 min forward rotation x 4 min reverse rotation at minimal resistance, working to increase BUE strength and activity tolerance for self care.  Completed bicep strengthening with use of 3 lb dumbbell to complete 2 sets 10 reps each side, and 2 lb dowel to complete bilat chest press, shoulder flexion, abd, ER, extension, and IR x2 sets 10 reps each.  Intermittent cues for posture and movement quality.    Response to Treatment: See Plan/clinical impression below.    OT Education - 01/03/22 0825     Education Details BUE ther ex    Person(s) Educated Patient    Methods Explanation;Verbal cues;Demonstration;Tactile cues    Comprehension Verbalized understanding;Need further instruction;Verbal cues required;Returned demonstration              OT Short Term  Goals - 11/30/21 0851       OT SHORT TERM GOAL #1   Title Pt will perform HEP for BUE strengthening and coordination training with mod vc/supv from family member.    Baseline Eval: not yet initiated    Time 3    Period Weeks    Status New    Target Date 12/19/21               OT Long Term Goals - 11/30/21 0852       OT LONG TERM GOAL #1   Title Pt will increase FOTO score to 50 or better to indicate increased functional performance.    Baseline  Eval: FOTO 45    Time 6    Period Weeks    Status New    Target Date 01/09/22      OT LONG TERM GOAL #2   Title Pt will improve R shoulder strength by 1 MM grade to ease tolerance for reaching overhead with self care/IADL tasks.    Baseline Eval: R shoulder strength 3-/5    Time 6    Period Weeks    Status New    Target Date 01/09/22      OT LONG TERM GOAL #3   Title Pt will increase R grip strength by 5 or more lbs to increase tolerance for carrying/transporting heavier ADL supplies.    Baseline Eval: R grip 35 lbs (L dominant is 44 lbs)    Time 6    Period Weeks    Status New    Target Date 01/09/22      OT LONG TERM GOAL #4   Title Pt will ulitize compensatory ADL strategies and AE as needed with mod vc to reduce fall risk with self care and functional mobility.    Baseline Eval: Family making handicapped bathroom modifications next week; frequent falls with ADLs    Time 6    Period Weeks    Status New    Target Date 01/09/22      OT LONG TERM GOAL #5   Title Pt will utilize WB strategies as needed with mod vc to manage tremors during self care tasks.    Baseline Eval: not yet initiated education    Time 6    Period Weeks    Status New    Target Date 01/09/22               Plan - 01/03/22 0833     Clinical Impression Statement Pt tolerating increased resistance for bilat shoulder exercises well with rest breaks.  Pt reports minimal to no pain in bilat shoulders, mostly the constant pain in her back.   Remaining visits will continue to focus on BUE strengthening in order to maximize ADL performance and ease tolerance for overhead reaching during daily tasks.    OT Occupational Profile and History Problem Focused Assessment - Including review of records relating to presenting problem    Occupational performance deficits (Please refer to evaluation for details): ADL's;IADL's    Body Structure / Function / Physical Skills ADL;Coordination;GMC;UE functional use;Balance;Body mechanics;Decreased knowledge of use of DME;Flexibility;IADL;Dexterity;FMC;Strength;Gait;Mobility;ROM    Rehab Potential Good    Clinical Decision Making Several treatment options, min-mod task modification necessary    Comorbidities Affecting Occupational Performance: May have comorbidities impacting occupational performance    Modification or Assistance to Complete Evaluation  No modification of tasks or assist necessary to complete eval    OT Frequency 2x / week    OT Duration 6 weeks    OT Treatment/Interventions Self-care/ADL training;Therapeutic exercise;DME and/or AE instruction;Functional Mobility Training;Balance training;Neuromuscular education;Manual Therapy;Energy conservation;Passive range of motion;Therapeutic activities;Patient/family education    Consulted and Agree with Plan of Care Patient             Patient will benefit from skilled therapeutic intervention in order to improve the following deficits and impairments:   Body Structure / Function / Physical Skills: ADL, Coordination, GMC, UE functional use, Balance, Body mechanics, Decreased knowledge of use of DME, Flexibility, IADL, Dexterity, FMC, Strength, Gait, Mobility, ROM       Visit Diagnosis: Muscle weakness (generalized)  Other lack of coordination    Problem List  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, psychotic disturbance, mood disturbance, or anxiety ([place])  11/22/2021   TIA (transient ischemic attack) 09/05/2021   Stomatitis and mucositis 07/21/2021   Cheilitis 07/21/2021   Callus of foot 01/19/2021   Pes planus 01/19/2021   PTTD (posterior tibial tendon dysfunction) 01/19/2021   Acute flank pain 12/15/2020   Cystitis 12/15/2020   Urinary symptom or sign 12/15/2020   Low back pain radiating to right lower extremity 06/23/2020   Gastroesophageal reflux disease 04/28/2020   Large bowel obstruction ([place]) 04/17/2019   Acquired trigger finger 03/20/2019   Iron deficiency anemia 11/21/2018   Syncope 03/18/2018   Anemia 03/18/2018   Exophthalmos 02/22/2018   Multiple thyroid nodules 02/22/2018   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   Pain at surgical site 08/30/2016   Ventral incisional hernia 08/21/2016   SBO (small bowel obstruction) ([facility])    Partial small bowel obstruction ([place]) 04/23/2016   Adjustment disorder with mixed anxiety and depressed mood 04/23/2016   Memory difficulties 09/01/2015   Arthritis 06/11/2015   Back pain, chronic 06/11/2015   HLD (hyperlipidemia) 06/11/2015   Cannot sleep 06/11/2015   L-S radiculopathy 06/11/2015   Arthritis of knee, degenerative 06/11/2015   B12 deficiency 06/11/2015   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 [place], [place], [address] Phone: [phone]   Fax:  [phone]  Name: Julie Acevedo MRN: [id number] Date of Birth: September 24, 1951

## 2022-01-08 ENCOUNTER — Other Ambulatory Visit: Payer: Self-pay | Admitting: Family Medicine

## 2022-01-08 DIAGNOSIS — N1832 Chronic kidney disease, stage 3b: Secondary | ICD-10-CM | POA: Diagnosis not present

## 2022-01-08 DIAGNOSIS — R251 Tremor, unspecified: Secondary | ICD-10-CM | POA: Diagnosis not present

## 2022-01-08 DIAGNOSIS — Z8673 Personal history of transient ischemic attack (TIA), and cerebral infarction without residual deficits: Secondary | ICD-10-CM | POA: Diagnosis not present

## 2022-01-08 DIAGNOSIS — E1122 Type 2 diabetes mellitus with diabetic chronic kidney disease: Secondary | ICD-10-CM | POA: Diagnosis not present

## 2022-01-08 DIAGNOSIS — R29898 Other symptoms and signs involving the musculoskeletal system: Secondary | ICD-10-CM | POA: Diagnosis not present

## 2022-01-08 DIAGNOSIS — M5417 Radiculopathy, lumbosacral region: Secondary | ICD-10-CM

## 2022-01-09 ENCOUNTER — Ambulatory Visit: Payer: Medicare Other

## 2022-01-09 NOTE — Telephone Encounter (Signed)
LOV: 11/22/2021  NOV: 01/29/2022   Thanks,   -Mickel Baas

## 2022-01-09 NOTE — Telephone Encounter (Signed)
Requested medications are due for refill today.  unsure  Requested medications are on the active medications list.  yes  Last refill. 08/02/2021  Future visit scheduled.   yes  Notes to clinic.  Medication not delegated.    Requested Prescriptions  Pending Prescriptions Disp Refills   traMADol (ULTRAM) 50 MG tablet [Pharmacy Med Name: TRAMADOL HCL 50 MG TABLET] 100 tablet 2    Sig: TAKE 1 TABLET BY MOUTH EVERY 6 HOURS AS NEEDED     Not Delegated - Analgesics:  Opioid Agonists Failed - 01/08/2022  8:32 PM      Failed - This refill cannot be delegated      Passed - Urine Drug Screen completed in last 360 days      Passed - Valid encounter within last 6 months    Recent Outpatient Visits           1 month ago 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, psychotic disturbance, mood disturbance, or anxiety ([place])   [facility] Thedore Mins, [place], PA-C   3 months ago Syncope, unspecified syncope type   [facility] Jerrol Banana., MD   4 months ago Type 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, without long-term current use of insulin [facility])   [facility] Jerrol Banana., MD   5 months ago Stomatitis and mucositis   [facility] Tally Joe T, FNP   8 months ago Seasonal allergic rhinitis due to pollen   [facility] Rosanna Randy, Retia Passe., MD       Future Appointments             In 2 weeks Jerrol Banana., MD [facility], [place]   In 1 month Jerrol Banana., MD [facility], [place]

## 2022-01-12 ENCOUNTER — Ambulatory Visit: Payer: Medicare Other

## 2022-01-12 ENCOUNTER — Other Ambulatory Visit: Payer: Self-pay

## 2022-01-12 DIAGNOSIS — M6281 Muscle weakness (generalized): Secondary | ICD-10-CM

## 2022-01-12 DIAGNOSIS — R2681 Unsteadiness on feet: Secondary | ICD-10-CM

## 2022-01-12 DIAGNOSIS — R278 Other lack of coordination: Secondary | ICD-10-CM | POA: Diagnosis not present

## 2022-01-12 DIAGNOSIS — F02A0 Dementia in other diseases classified elsewhere, mild, without behavioral disturbance, psychotic disturbance, mood disturbance, and anxiety: Secondary | ICD-10-CM | POA: Diagnosis not present

## 2022-01-12 DIAGNOSIS — R2689 Other abnormalities of gait and mobility: Secondary | ICD-10-CM | POA: Diagnosis not present

## 2022-01-12 NOTE — Therapy (Signed)
[place] MAIN [facility] SERVICES [address] [place], [place], [address] Phone: [phone]   Fax:  [phone]  Occupational Therapy Treatment/Recertificaton  Patient Details  Name: Julie Acevedo MRN: [id number] Date of Birth: Mar 30, 1951 Referring Provider (OT): Mikey Kirschner, [place]   Encounter Date: 01/12/2022   OT End of Session - 01/12/22 1530     Visit Number 9    Number of Visits 12    Date for OT Re-Evaluation 01/09/22    Authorization Type Reporting period beginning 11/29/2021    OT Start Time 0930    OT Stop Time 1015    OT Time Calculation (min) 45 min    Activity Tolerance Patient tolerated treatment well    Behavior During Therapy [facility] for tasks assessed/performed             Past Medical History:  Diagnosis Date   Anemia    Arthritis    COVID-19 11/19/2019   Diverticulitis    DM (diabetes mellitus) ([place])    typ e 2   History of methicillin resistant staphylococcus aureus (MRSA) 2017   HTN (hypertension)    Hyperlipidemia    Memory difficulties 09/01/2015   Multiple thyroid nodules    Myocardial infarction ([place]) 1995   Short-term memory loss    Thyroid nodule     Past Surgical History:  Procedure Laterality Date   ABDOMINAL HYSTERECTOMY     due to endometriosis-1 ovary left   BREAST EXCISIONAL BIOPSY Right 1971   neg   CARDIAC CATHETERIZATION  2000   no significant CAD per note of Dr Caryl Comes in 2013    [place] N/A 03/19/2017   Procedure: ANTERIOR REPAIR (CYSTOCELE);  Surgeon: Bjorn Loser, MD;  Location: [facility];  Service: Urology;  Laterality: N/A;   CYSTOSCOPY N/A 03/19/2017   Procedure: CYSTOSCOPY;  Surgeon: Bjorn Loser, MD;  Location: [facility];  Service: Urology;  Laterality: N/A;   ESOPHAGOGASTRODUODENOSCOPY (EGD) WITH PROPOFOL N/A 12/15/2021   Procedure: ESOPHAGOGASTRODUODENOSCOPY (EGD) WITH PROPOFOL;  Surgeon: Lesly Rubenstein, MD;  Location: [facility] ENDOS[facility];   Service: Endoscopy;  Laterality: N/A;   EYE SURGERY     FOOT SURGERY     HAND SURGERY     HEMICOLECTOMY     INSERTION OF MESH N/A 08/21/2016   Procedure: INSERTION OF MESH;  Surgeon: Excell Seltzer, MD;  Location: [facility];  Service: General;  Laterality: N/A;   INSERTION OF MESH N/A 12/08/2019   Procedure: INSERTION OF MESH;  Surgeon: Jules Husbands, MD;  Location: [facility];  Service: General;  Laterality: N/A;   LAPAROSCOPIC LYSIS OF ADHESIONS N/A 08/21/2016   Procedure: LAPAROSCOPIC LYSIS OF ADHESIONS;  Surgeon: Excell Seltzer, MD;  Location: [facility];  Service: General;  Laterality: N/A;   LAPAROSCOPIC REMOVAL ABDOMINAL MASS     LAPAROTOMY N/A 04/16/2019   Procedure: EXPLORATORY LAPAROTOMY;  Surgeon: Benjamine Sprague, DO;  Location: [facility];  Service: General;  Laterality: N/A;   LOOP RECORDER INSERTION N/A 03/21/2018   Procedure: LOOP RECORDER INSERTION;  Surgeon: Sanda Klein, MD;  Location: [facility];  Service: Cardiovascular;  Laterality: N/A;   LUMBAR DISC SURGERY     ROUX-EN-Y GASTRIC BYPASS  2010   Dr Hassell Done   SMALL INTESTINE SURGERY     TONSILLECTOMY     UPPER GI ENDOSCOPY  01/12/2016   normal larynx, normal esophagus. gastric bypass with a normal-sized pouch and intact staple line, normal examined jejunum, otherwise exam was normal  VENTRAL HERNIA REPAIR N/A 08/21/2016   Procedure: LAPAROSCOPIC VENTRAL HERNIA;  Surgeon: Excell Seltzer, MD;  Location: [facility];  Service: General;  Laterality: N/A;   XI ROBOTIC ASSISTED VENTRAL HERNIA N/A 12/08/2019   Procedure: XI ROBOTIC ASSISTED VENTRAL HERNIA;  Surgeon: Jules Husbands, MD;  Location: [facility];  Service: General;  Laterality: N/A;    There were no vitals filed for this visit.   Subjective Assessment - 01/12/22 1527     Subjective  "I got up early this morning to take a pain pill because I was hurting."    Patient is accompanied by: Family member    Pertinent History [name], UB/LB tremors,  frequent falls    Limitations STM impairment, reports frequent hallucinations, UB/LB tremors, frequent falls    Patient Stated Goals Pt reports she wishes she didn't have tremors but is receptive to strategies to help manage self care tasks with tremors present.    Currently in Pain? Yes    Pain Score 5     Pain Location Back    Pain Orientation Lower    Pain Descriptors / Indicators Aching;Sore    Pain Type Chronic pain    Pain Onset More than a month ago    Pain Frequency Constant    Aggravating Factors  moving around    Pain Relieving Factors rest, heat, pain meds    Effect of Pain on Daily Activities limited activity tolerance for mobility, occasional difficulty sleeping    Multiple Pain Sites No             [facility] OT Assessment - 01/12/22 0001       AROM   Overall AROM Comments R shoulder flex 126, L 145, R abd 134, L 170      Strength   Overall Strength Comments R shoulder flex/abd 3+, L 4+; bilat elbow/wrist flex/ext 5/5      Hand Function   Right Hand Grip (lbs) 37    Left Hand Grip (lbs) 41             Occupational Therapy tx/Recertification: Therapeutic Exercise: Measurements taken, goals updated, see note for details.  Issued yellow theraband and instructed pt in strengthening exercises for bilat shoulder horiz abd, flexion, abd, ER, chest press x1 set 10 reps each, mod tactile cues for posture and technique.  Issued pink theraputty and instructed pt in strengthening and coordination exercises for R/L hands, including gross grasping, lateral/2 point/3 point pinching, digit abd/add, and digging coins out of putty.  Able to return demo with intermittent  mod vc for technique to improve quality of movement.  Encouraged completion 5-10 min, 1-2x per day.  HEP from [employer] given for both theraband and putty exercises, and will continue to review.   Response to Treatment: Pt making steady gains with shoulder flexibility and strength, and reports improving ability to  reach overhead into kitchen cupboards.  Issued putty today to begin grip strengthening for better ability to hold and carry ADL supplies.  Pt is showing good carry overhead with compensatory strategies to manage tremors during self care tasks, including use of WB into forearms to minimize distal tremor, and pt is consistently using swivel spoon to aid in reduced spilling when self feeding.  Pt will continue to benefit from skilled OT for continued progression of bilat shoulder flexibility, shoulder strengthening, and hand strengthening in order to maximize indep with daily tasks.  Pt has foot surgery planned for Monday, but she anticipates using walking boot which  would enable her to still attend her therapy sessions.      OT Education - 01/12/22 1529     Education Details theraputty exercises, theraband exercises    Person(s) Educated Patient    Methods Explanation;Verbal cues;Demonstration;Tactile cues    Comprehension Verbalized understanding;Need further instruction;Verbal cues required;Returned demonstration              OT Short Term Goals - 01/12/22 1530       OT SHORT TERM GOAL #1   Title Pt will perform HEP for BUE strengthening and coordination training with mod vc/supv from family member.    Baseline Eval: not yet initiated; 01/12/22 mod vc for theraband and putty exercises    Time 3    Period Weeks    Status On-going    Target Date 12/19/21               OT Long Term Goals - 01/12/22 1532       OT LONG TERM GOAL #1   Title Pt will increase FOTO score to 50 or better to indicate increased functional performance.    Baseline Eval: FOTO 45    Time 6    Period Weeks    Status On-going    Target Date 02/09/22      OT LONG TERM GOAL #2   Title Pt will improve R shoulder strength by 1 MM grade to ease tolerance for reaching overhead with self care/IADL tasks.    Baseline Eval: R shoulder strength 3-/5; 01/12/22: R shoulder 3+/5; pt reports she is able to reach higher  into cupboards    Time 6    Period Weeks    Status On-going    Target Date 02/09/22      OT LONG TERM GOAL #3   Title Pt will increase R grip strength by 5 or more lbs to increase tolerance for carrying/transporting heavier ADL supplies.    Baseline Eval: R grip 35 lbs (L dominant is 44 lbs); 01/12/22: R grip 37, L 41 (putty just issued today)    Time 6    Period Weeks    Status On-going    Target Date 02/09/22      OT LONG TERM GOAL #4   Title Pt will ulitize compensatory ADL strategies and AE as needed with mod vc to reduce fall risk with self care and functional mobility.    Baseline Eval: Family making handicapped bathroom modifications next week; frequent falls with ADLs    Time 6    Period Weeks    Status On-going    Target Date 02/09/22      OT LONG TERM GOAL #5   Title Pt will utilize WB strategies as needed with mod vc to manage tremors during self care tasks.    Baseline Eval: not yet initiated education; 01/12/22: Pt performs with min vc    Time 6    Period Weeks    Status On-going    Target Date 02/09/22                 Plan - 01/12/22 1551     Clinical Impression Statement Pt making steady gains with shoulder flexibility and strength, and reports improving ability to reach overhead into kitchen cupboards.  Issued putty today to begin grip strengthening for better ability to hold and carry ADL supplies.  Pt is showing good carry overhead with compensatory strategies to manage tremors during self care tasks, including use of WB into forearms to minimize distal  tremor, and pt is consistently using swivel spoon to aid in reduced spilling when self feeding.  Pt will continue to benefit from skilled OT for continued progression of bilat shoulder flexibility, shoulder strengthening, and hand strengthening in order to maximize indep with daily tasks.  Pt has foot surgery planned for Monday, but she anticipates using walking boot which would enable her to still attend her  therapy sessions.    OT Occupational Profile and History Problem Focused Assessment - Including review of records relating to presenting problem    Occupational performance deficits (Please refer to evaluation for details): ADL's;IADL's    Body Structure / Function / Physical Skills ADL;Coordination;GMC;UE functional use;Balance;Body mechanics;Decreased knowledge of use of DME;Flexibility;IADL;Dexterity;FMC;Strength;Gait;Mobility;ROM    Rehab Potential Good    Clinical Decision Making Several treatment options, min-mod task modification necessary    Comorbidities Affecting Occupational Performance: May have comorbidities impacting occupational performance    Modification or Assistance to Complete Evaluation  No modification of tasks or assist necessary to complete eval    OT Frequency 2x / week    OT Duration 4 weeks    OT Treatment/Interventions Self-care/ADL training;Therapeutic exercise;DME and/or AE instruction;Functional Mobility Training;Balance training;Neuromuscular education;Manual Therapy;Energy conservation;Passive range of motion;Therapeutic activities;Patient/family education    Consulted and Agree with Plan of Care Patient             Patient will benefit from skilled therapeutic intervention in order to improve the following deficits and impairments:   Body Structure / Function / Physical Skills: ADL, Coordination, GMC, UE functional use, Balance, Body mechanics, Decreased knowledge of use of DME, Flexibility, IADL, Dexterity, FMC, Strength, Gait, Mobility, ROM       Visit Diagnosis: Muscle weakness (generalized)  Other lack of coordination    Problem List 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, psychotic disturbance, mood disturbance, or anxiety (HCC) 11/22/2021   TIA (transient ischemic attack) 09/05/2021   Stomatitis and mucositis 07/21/2021   Cheilitis 07/21/2021   Callus of foot 01/19/2021    Pes planus 01/19/2021   PTTD (posterior tibial tendon dysfunction) 01/19/2021   Acute flank pain 12/15/2020   Cystitis 12/15/2020   Urinary symptom or sign 12/15/2020   Low back pain radiating to right lower extremity 06/23/2020   Gastroesophageal reflux disease 04/28/2020   Large bowel obstruction ([place]) 04/17/2019   Acquired trigger finger 03/20/2019   Iron deficiency anemia 11/21/2018   Syncope 03/18/2018   Anemia 03/18/2018   Exophthalmos 02/22/2018   Multiple thyroid nodules 02/22/2018   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   Pain at surgical site 08/30/2016   Ventral incisional hernia 08/21/2016   SBO (small bowel obstruction) (HCC)    Partial small bowel obstruction ([place]) 04/23/2016   Adjustment disorder with mixed anxiety and depressed mood 04/23/2016   Memory difficulties 09/01/2015   Arthritis 06/11/2015   Back pain, chronic 06/11/2015   HLD (hyperlipidemia) 06/11/2015   Cannot sleep 06/11/2015   L-S radiculopathy 06/11/2015   Arthritis of knee, degenerative 06/11/2015   B12 deficiency 06/11/2015   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  [place], [place], [address] Phone: [phone]   Fax:  [phone]  Name: LASHARA UREY MRN: [id number] Date of Birth: 19-Sep-1951

## 2022-01-12 NOTE — Therapy (Signed)
[place] MAIN [facility] SERVICES [address] [place], [place], [address] Phone: [phone]   Fax:  [phone]  Physical Therapy Treatment  Patient Details  Name: Julie Acevedo MRN: [id number] Date of Birth: Aug 26, 1951 No data recorded  Encounter Date: 01/12/2022   PT End of Session - 01/12/22 1043     Visit Number 5    Number of Visits 25    Date for PT Re-Evaluation 03/21/22    PT Start Time 6962    PT Stop Time 0930    PT Time Calculation (min) 43 min    Equipment Utilized During Treatment Gait belt    Activity Tolerance Patient tolerated treatment well    Behavior During Therapy [facility] for tasks assessed/performed             Past Medical History:  Diagnosis Date   Anemia    Arthritis    COVID-19 11/19/2019   Diverticulitis    DM (diabetes mellitus) ([place])    typ e 2   History of methicillin resistant staphylococcus aureus (MRSA) 2017   HTN (hypertension)    Hyperlipidemia    Memory difficulties 09/01/2015   Multiple thyroid nodules    Myocardial infarction ([place]) 1995   Short-term memory loss    Thyroid nodule     Past Surgical History:  Procedure Laterality Date   ABDOMINAL HYSTERECTOMY     due to endometriosis-1 ovary left   BREAST EXCISIONAL BIOPSY Right 1971   neg   CARDIAC CATHETERIZATION  2000   no significant CAD per note of Dr Caryl Comes in 2013    [place] N/A 03/19/2017   Procedure: ANTERIOR REPAIR (CYSTOCELE);  Surgeon: Bjorn Loser, MD;  Location: [facility];  Service: Urology;  Laterality: N/A;   CYSTOSCOPY N/A 03/19/2017   Procedure: CYSTOSCOPY;  Surgeon: Bjorn Loser, MD;  Location: [facility];  Service: Urology;  Laterality: N/A;   ESOPHAGOGASTRODUODENOSCOPY (EGD) WITH PROPOFOL N/A 12/15/2021   Procedure: ESOPHAGOGASTRODUODENOSCOPY (EGD) WITH PROPOFOL;  Surgeon: Lesly Rubenstein, MD;  Location: [facility];  Service: Endoscopy;  Laterality: N/A;   EYE SURGERY     FOOT  SURGERY     HAND SURGERY     HEMICOLECTOMY     INSERTION OF MESH N/A 08/21/2016   Procedure: INSERTION OF MESH;  Surgeon: Excell Seltzer, MD;  Location: [facility];  Service: General;  Laterality: N/A;   INSERTION OF MESH N/A 12/08/2019   Procedure: INSERTION OF MESH;  Surgeon: Jules Husbands, MD;  Location: [facility];  Service: General;  Laterality: N/A;   LAPAROSCOPIC LYSIS OF ADHESIONS N/A 08/21/2016   Procedure: LAPAROSCOPIC LYSIS OF ADHESIONS;  Surgeon: Excell Seltzer, MD;  Location: [facility];  Service: General;  Laterality: N/A;   LAPAROSCOPIC REMOVAL ABDOMINAL MASS     LAPAROTOMY N/A 04/16/2019   Procedure: EXPLORATORY LAPAROTOMY;  Surgeon: Benjamine Sprague, DO;  Location: [facility];  Service: General;  Laterality: N/A;   LOOP RECORDER INSERTION N/A 03/21/2018   Procedure: LOOP RECORDER INSERTION;  Surgeon: Sanda Klein, MD;  Location: [place] [facility];  Service: Cardiovascular;  Laterality: N/A;   LUMBAR DISC SURGERY     ROUX-EN-Y GASTRIC BYPASS  2010   Dr Hassell Done   SMALL INTESTINE SURGERY     TONSILLECTOMY     UPPER GI ENDOSCOPY  01/12/2016   normal larynx, normal esophagus. gastric bypass with a normal-sized pouch and intact staple line, normal examined jejunum, otherwise exam was normal   VENTRAL HERNIA  REPAIR N/A 08/21/2016   Procedure: LAPAROSCOPIC VENTRAL HERNIA;  Surgeon: Excell Seltzer, MD;  Location: [facility];  Service: General;  Laterality: N/A;   XI ROBOTIC ASSISTED VENTRAL HERNIA N/A 12/08/2019   Procedure: XI ROBOTIC ASSISTED VENTRAL HERNIA;  Surgeon: Jules Husbands, MD;  Location: [facility];  Service: General;  Laterality: N/A;    There were no vitals filed for this visit.   Subjective Assessment - 01/12/22 1041     Subjective Pt reports she has upcoming surgical procedure on Monday for L foot. Pt with limited details regarding type of procedure. Pt reports no recent stumbles/falls or pain currently.    Pertinent History Pt presents for PT evaluation ambulating  without an AD. Pt reports she was recently diagnosed with [name], but per her reports pt is unsure if this was 100% conclusive. Pt reports she is having difficulty with her gait and frequent falls. She reports dragging her feet and tremors in her BUEs. Pt thinks falls are due to knee buckling. While pt not using an AD today, she does report she will use a cane at times but does not think it helps prevent her falls. Pt reports she has chronic back pain. She reports hx of 2 back surgeries and that she also has arthritis. Per chart, PMH significant for DMII, HTN, OA, chronic back pain, HLD, L-S radiculopathy, knee OA, B12 deficiency, memory difficulties, adjustment disorder with mixed anxiety and depressed mood, ventral incisional hernia, syncope, anemia, gastroesophageal reflux disease, cystitis, PTTD, TIA.    Limitations Sitting;Standing;Walking;House hold activities    How long can you sit comfortably? Pt repots <10 minutes    How long can you stand comfortably? Pt reports 5-10 minutes    How long can you walk comfortably? Pt reports pain and balance limit her walking    Diagnostic tests Per chart: "[name] 09/05/2021: IMPRESSION:  No evidence of acute intracranial abnormality.     Stable noncontrast MRI appearance of the brain as compared to  02/18/2021.  Mild chronic small-vessel ischemic changes within the cerebral white  matter.   Mild generalized cerebral atrophy. "    08/30/2021 CT HEAD WO CONTRAST: IMPRESSION:  No evidence of acute intracranial abnormality. Mild cerebral atrophy without appreciable lobar predominance, not  significantly changed from the brain MRI of 02/18/2021. Mild chronic small-vessel ischemic disease within the cerebral white  matter.    Patient Stated Goals Pt would like to improve her endurance, decrease falls    Currently in Pain? No/denies    Pain Onset More than a month ago              INTERVENTIONS   Neuro RE-ed: Gait belt donned and CGA  provided throughout unless otherwise specified.   Performed in // bars:  Pt performs the following interventions for several minutes with horizontal head turns: NBOS, firm surface Semi-tandem each LE x2 rounds, firm surface - pt uses intermittent UE support Tandem each LE x 2 rounds, firm surface - pt uses intermittent UE support. Pt rates as very challenging  Pt performs the following interventions for several minutes with vertical head turns: NBOS, firm surface Semi-tandem each LE x 2 rounds, firm surface - intermittent UE support Tandem each LE x 2 rounds, firm surface - intermittent UE support. Pt rates as very challenging  Cone taps/heel taps - 3 cones placed in front of pt. PT calling out different combinations for SLB progression, performed B. Pt improves with reps and rates intervention as challenging.  Pt uses intermittent UE support.  SLB 2x30 sec each LE with decreasing levels of UE support.   Airex beam lateral stepping with decreasing levels of UE support x multiple reps. Pt states she is 50% confident in her balance with intervention. Pt was able to perform without UE support.   Tandem walking in // bars 6x. Pt rates as medium. Pt with minimal use of UE support.    FOTO: 60%  TherEx:  5xSTS: 12 sec hands- free  Standing hip abduction 2x12 B; pt rates as medium  STS 2x10 hands-free; pt rates medium  10MWT: 0.98 m/s   PT instructs pt that she will need clearance from her surgeon regarding when she can return to/resume PT following her procedure. Pt verbalizes understanding.    Pt educated throughout session about proper posture and technique with exercises. Improved exercise technique, movement at target joints, use of target muscles after min to mod verbal, visual, tactile cues.        PT Education - 01/12/22 1042     Education Details exercise technique, body mechanics. PT discusses with pt that she will need clearance from her surgeon before she can return to  PT following procedure.    Person(s) Educated Patient    Methods Explanation;Demonstration;Verbal cues    Comprehension Verbalized understanding;Returned demonstration              PT Short Term Goals - 12/27/21 1024       PT SHORT TERM GOAL #1   Title Pt will be independent with HEP in order to improve strength and balance in order to decrease fall risk and improve function at home and work.    Baseline 1/11: initiated    Time 6    Period Weeks    Status New    Target Date 02/07/22               PT Long Term Goals - 01/12/22 1054       PT LONG TERM GOAL #1   Title Pt will improve ABC by at least 13% in order to demonstrate clinically significant improvement in balance confidence.    Baseline 1/11: 61.25%    Time 12    Period Weeks    Status New    Target Date 03/21/22      PT LONG TERM GOAL #2   Title Pt will decrease 5TSTS by at least 3 seconds in order to demonstrate clinically significant improvement in LE strength.    Baseline 1/11: 14 sec; 1/27: 12 sec hands-free    Time 12    Period Weeks    Status Partially Met    Target Date 03/21/22      PT LONG TERM GOAL #3   Title Pt will improve BERG by at least 3 points in order to demonstrate clinically significant improvement in balance.    Baseline 1/11: 53/56, greatest difficulty with SLB    Time 12    Period Weeks    Status New    Target Date 03/21/22      PT LONG TERM GOAL #4   Title Patient will increase FOTO score to equal to or greater than 55  to demonstrate statistically significant improvement in mobility and quality of life.    Baseline 1/11: 34%; 1/27: 60%    Time 12    Period Weeks    Status Achieved    Target Date 03/21/22      PT LONG TERM GOAL #5   Title Patient will  increase 10 meter walk test to >1.2 m/s as to improve gait speed for better community ambulation and to reduce fall risk.    Baseline 1/11: 0.8 m/s; 1/27: 0.98 m/s    Time 12    Period Weeks    Status On-going    Target  Date 03/21/22      PT LONG TERM GOAL #6   Title Patient will increase six minute walk test distance to >1000 for progression to community ambulator and improve gait ability    Baseline 1/11: to be assessed next 1-2 visits; 1/13: 1360 ft    Time 12    Period Weeks    Status Achieved    Target Date 03/21/22                   Plan - 01/12/22 1055     Clinical Impression Statement Some of pt's goals were reassessed on this date as pt reported she has upcoming surgical procedure for L foot (pt difficulty providing full details regarding type of procedure). The pt improved FOTO, 5xSTS and 10MWT scores, indicating increased perceived functional mobility/QOL, improved gait speed, improved LE strength/power and decreased fall risk. PT instructed pt that she will need clearance from her surgeon before she can resume PT. The pt verbalized understanding. The pt will benefit from further skilled PT to improve LE strength, balance and gait to decrease fall risk and increase QOL.    Personal Factors and Comorbidities Age;Comorbidity 3+;Sex;Past/Current Experience    Comorbidities PMH includes the following: DMII, HTN, OA, chronic back pain, HLD, L-S radiculopathy, knee OA, B12 deficiency, memory difficulties, adjustment disorder with mixed anxiety and depressed mood, ventral incisional hernia, syncope, anemia, gastroesophageal reflux disease, cystitis, PTTD, TIA.    Examination-Activity Limitations Sit;Stand;Locomotion Level;Stairs    Examination-Participation Restrictions Driving;Community Activity;Shop;Yard Work    [employer] Evolving/Moderate complexity    Rehab Potential Good    PT Frequency 2x / week    PT Duration 12 weeks    PT Treatment/Interventions ADLs/Self Care Home Management;Aquatic Therapy;Biofeedback;Canalith Repostioning;Cryotherapy;Electrical Stimulation;Moist Heat;Traction;Ultrasound;Gait training;DME Instruction;Stair training;Functional mobility  training;Therapeutic activities;Therapeutic exercise;Balance training;Neuromuscular re-education;Cognitive remediation;Patient/family education;Orthotic Fit/Training;Wheelchair mobility training;Manual techniques;Passive range of motion;Energy conservation;Taping;Splinting;Vestibular;Visual/perceptual remediation/compensation;Joint Manipulations;Spinal Manipulations    PT Next Visit Plan Further advance HEP, 6MWT, initiate strengthening and balance interventions    PT Home Exercise Plan 1/11: Access Code: [email or web address]    Consulted and Agree with Plan of Care Patient             Patient will benefit from skilled therapeutic intervention in order to improve the following deficits and impairments:  Pain, Decreased balance, Abnormal gait, Decreased activity tolerance, Decreased cognition, Decreased endurance, Decreased knowledge of use of DME, Decreased range of motion, Decreased strength, Hypomobility, Impaired UE functional use, Improper body mechanics, Decreased coordination, Decreased mobility, Difficulty walking, Postural dysfunction  Visit Diagnosis: Unsteadiness on feet  Muscle weakness (generalized)  Other lack of coordination  Other abnormalities of gait and mobility     Problem List 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, psychotic disturbance, mood disturbance, or anxiety ([place]) 11/22/2021   TIA (transient ischemic attack) 09/05/2021   Stomatitis and mucositis 07/21/2021   Cheilitis 07/21/2021   Callus of foot 01/19/2021   Pes planus 01/19/2021   PTTD (posterior tibial tendon dysfunction) 01/19/2021   Acute flank pain 12/15/2020   Cystitis 12/15/2020   Urinary symptom or sign 12/15/2020   Low back pain radiating to right lower extremity  06/23/2020   Gastroesophageal reflux disease 04/28/2020   Large bowel obstruction ([place]) 04/17/2019   Acquired trigger finger 03/20/2019   Iron deficiency anemia 11/21/2018    Syncope 03/18/2018   Anemia 03/18/2018   Exophthalmos 02/22/2018   Multiple thyroid nodules 02/22/2018   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   Pain at surgical site 08/30/2016   Ventral incisional hernia 08/21/2016   SBO (small bowel obstruction) (H[facility])    Partial small bowel obstruction ([place]) 04/23/2016   Adjustment disorder with mixed anxiety and depressed mood 04/23/2016   Memory difficulties 09/01/2015   Arthritis 06/11/2015   Back pain, chronic 06/11/2015   HLD (hyperlipidemia) 06/11/2015   Cannot sleep 06/11/2015   L-S radiculopathy 06/11/2015   Arthritis of knee, degenerative 06/11/2015   B12 deficiency 06/11/2015   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 [place], [place], [address] Phone: [phone]   Fax:  [phone]  Name: Julie Acevedo MRN: [id number] Date of Birth: Jun 30, 1951

## 2022-01-15 ENCOUNTER — Encounter: Payer: Self-pay | Admitting: Podiatry

## 2022-01-15 ENCOUNTER — Ambulatory Visit: Payer: Medicare Other

## 2022-01-15 ENCOUNTER — Other Ambulatory Visit: Payer: Self-pay | Admitting: Podiatry

## 2022-01-15 DIAGNOSIS — M205X2 Other deformities of toe(s) (acquired), left foot: Secondary | ICD-10-CM | POA: Diagnosis not present

## 2022-01-15 DIAGNOSIS — M21542 Acquired clubfoot, left foot: Secondary | ICD-10-CM | POA: Diagnosis not present

## 2022-01-15 MED ORDER — OXYCODONE-ACETAMINOPHEN 5-325 MG PO TABS
1.0000 | ORAL_TABLET | ORAL | 0 refills | Status: DC | PRN
Start: 2022-01-15 — End: 2022-08-08

## 2022-01-16 DIAGNOSIS — N1832 Chronic kidney disease, stage 3b: Secondary | ICD-10-CM

## 2022-01-16 DIAGNOSIS — I10 Essential (primary) hypertension: Secondary | ICD-10-CM

## 2022-01-16 DIAGNOSIS — E1122 Type 2 diabetes mellitus with diabetic chronic kidney disease: Secondary | ICD-10-CM

## 2022-01-18 DIAGNOSIS — D631 Anemia in chronic kidney disease: Secondary | ICD-10-CM | POA: Diagnosis not present

## 2022-01-18 DIAGNOSIS — E785 Hyperlipidemia, unspecified: Secondary | ICD-10-CM | POA: Diagnosis not present

## 2022-01-18 DIAGNOSIS — N1832 Chronic kidney disease, stage 3b: Secondary | ICD-10-CM | POA: Diagnosis not present

## 2022-01-18 DIAGNOSIS — D509 Iron deficiency anemia, unspecified: Secondary | ICD-10-CM | POA: Diagnosis not present

## 2022-01-18 DIAGNOSIS — R809 Proteinuria, unspecified: Secondary | ICD-10-CM | POA: Diagnosis not present

## 2022-01-18 DIAGNOSIS — E876 Hypokalemia: Secondary | ICD-10-CM | POA: Diagnosis not present

## 2022-01-18 DIAGNOSIS — E119 Type 2 diabetes mellitus without complications: Secondary | ICD-10-CM | POA: Diagnosis not present

## 2022-01-18 DIAGNOSIS — I10 Essential (primary) hypertension: Secondary | ICD-10-CM | POA: Diagnosis not present

## 2022-01-22 ENCOUNTER — Telehealth: Payer: Self-pay

## 2022-01-22 NOTE — Telephone Encounter (Signed)
ILR reached RRT 12/09/21. Patient called made aware, would like to leave device in. Return kit requested via email.

## 2022-01-23 ENCOUNTER — Ambulatory Visit (INDEPENDENT_AMBULATORY_CARE_PROVIDER_SITE_OTHER): Payer: Medicare Other

## 2022-01-23 ENCOUNTER — Ambulatory Visit (INDEPENDENT_AMBULATORY_CARE_PROVIDER_SITE_OTHER): Payer: Medicare Other | Admitting: Podiatry

## 2022-01-23 ENCOUNTER — Other Ambulatory Visit: Payer: Self-pay

## 2022-01-23 ENCOUNTER — Encounter: Payer: Self-pay | Admitting: Podiatry

## 2022-01-23 VITALS — BP 109/60 | HR 70 | Temp 97.9°F

## 2022-01-23 DIAGNOSIS — Z9889 Other specified postprocedural states: Secondary | ICD-10-CM

## 2022-01-23 DIAGNOSIS — M216X2 Other acquired deformities of left foot: Secondary | ICD-10-CM

## 2022-01-23 NOTE — Progress Notes (Signed)
° °  Subjective:  Patient presents today status post left fifth metatarsal osteotomy. DOS: 01/15/2022.  Patient states that she is doing well.  She continues to have some pain and tenderness associated to the surgical site.  Otherwise no new complaints at this time.  She has been weightbearing in the surgical shoe as instructed  Past Medical History:  Diagnosis Date   Anemia    Arthritis    COVID-19 11/19/2019   Diverticulitis    DM (diabetes mellitus) ([place])    typ e 2   History of methicillin resistant staphylococcus aureus (MRSA) 2017   HTN (hypertension)    Hyperlipidemia    Memory difficulties 09/01/2015   Multiple thyroid nodules    Myocardial infarction ([place]) 1995   Short-term memory loss    Thyroid nodule       Objective/Physical Exam Neurovascular status intact.  Skin incision appears well coapted with sutures. No sign of infectious process noted. No dehiscence. No active bleeding noted.  No edema noted.  Radiographic Exam:  Osteotomy site to the fifth metatarsal appears clean with dorsal medial translocation of the capital fragment of the fifth metatarsal.  No hardware in the fifth metatarsal noted.  There is an orthopedic screw to the head of the second metatarsal as well as arthroplasty with implant the first MTP which appears stable and intact  Assessment: 1. s/p fifth metatarsal osteotomy without internal fixation left. DOS: 01/15/2022   Plan of Care:  1. Patient was evaluated. X-rays reviewed 2.  Dressings changed.  Patient may begin washing and showering and getting the foot wet. 3.  Continue weightbearing minimally in the surgical shoe 4.  Return to clinic 1 week for suture removal   Edrick Kins, DPM [facility]  Dr. Edrick Kins, DPM    [address], [place] [address]                Office [phone]  Fax [phone]

## 2022-01-26 NOTE — Progress Notes (Unsigned)
Established patient visit   Patient: Julie Acevedo   DOB: [birth year]   71 y.o. Female  MRN: [id number] Visit Date: 01/29/2022  Today's healthcare provider: [name], MD   No chief complaint on file.  Subjective    HPI  Diabetes Mellitus Type II, follow-up  Lab Results  Component Value Date   HGBA1C 5.6 09/06/2021   HGBA1C 5.3 08/29/2021   HGBA1C 5.5 03/06/2021   Last seen for diabetes 5 months ago.  Management since then includes continuing the same treatment. She reports {excellent/good/fair/poor:19665} compliance with treatment. She {is/is not:21021397} having side effects. {document side effects if present:1}  Home blood sugar records: {diabetes glucometry results:16657}  Episodes of hypoglycemia? {Yes/No:20286} {enter details if yes:1}   Current insulin regiment: {***Type 'None' if not taking insulin                                                otherwise enter complete                                                 details of insulin regiment:1} Most Recent Eye Exam: ***  --------------------------------------------------------------------------------------------------- Hypertension, follow-up  BP Readings from Last 3 Encounters:  01/23/22 109/60  12/15/21 (!) 155/72  12/13/21 120/71   Wt Readings from Last 3 Encounters:  12/15/21 126 lb (57.2 kg)  11/30/21 126 lb 6.4 oz (57.3 kg)  11/22/21 125 lb (56.7 kg)     She was last seen for hypertension 5 months ago.  BP at that visit was 120/60. Management since that visit includes; Excellent control of blood pressure. She reports {excellent/good/fair/poor:19665} compliance with treatment. She {is/is not:9024} having side effects. {document side effects if present:1} She {is/is not:9024} exercising. She {is/is not:9024} adherent to low salt diet.   Outside blood pressures are {enter patient reported home BP, or 'not being checked':1}.  She {does/does not:200015} smoke.  Use of agents  associated with hypertension: {bp agents assoc with hypertension:511::"none"}.   --------------------------------------------------------------------------------------------------- Lipid/Cholesterol, follow-up  Last Lipid Panel: Lab Results  Component Value Date   CHOL 134 09/06/2021   LDLCALC 63 09/06/2021   HDL 60 09/06/2021   TRIG 54 09/06/2021    She was last seen for this 5 months ago.  Management since that visit includes; Atorvastatin 40 excellent control.  She reports {excellent/good/fair/poor:19665} compliance with treatment. She {is/is not:9024} having side effects. {document side effects if present:1}  She is following a {diet:21022986} diet. Current exercise: {exercise EHOZY:24825}  Last metabolic panel Lab Results  Component Value Date   GLUCOSE 72 09/13/2021   NA 141 09/13/2021   K 4.4 09/13/2021   BUN 13 09/13/2021   CREATININE 1.42 (H) 09/13/2021   EGFR 40 (L) 09/13/2021   GFRNONAA 52 (L) 09/07/2021   CALCIUM 8.9 09/13/2021   AST 17 09/05/2021   ALT 7 09/05/2021   The ASCVD Risk score (Arnett DK, et al., 2019) failed to calculate for the following reasons:   The patient has a prior MI or stroke diagnosis  ---------------------------------------------------------------------------------------------------   Medications: Outpatient Medications Prior to Visit  Medication Sig   traMADol (ULTRAM) 50 MG tablet Take 1 tablet (50 mg total) by mouth every 6 (six) hours as needed.  aspirin EC 81 MG EC tablet Take 1 tablet (81 mg total) by mouth daily. Swallow whole.   atorvastatin (LIPITOR) 40 MG tablet Take 1 tablet (40 mg total) by mouth daily.   calcitRIOL (ROCALTROL) 0.25 MCG capsule Take 0.25 mcg by mouth daily.   cyclobenzaprine (FLEXERIL) 5 MG tablet TAKE 1 TABLET BY MOUTH THREE TIMES A DAY AS NEEDED   gabapentin (NEURONTIN) 300 MG capsule Take 1 capsule (300 mg total) by mouth 3 (three) times daily.   hydrOXYzine (VISTARIL) 100 MG capsule Take 1  capsule (100 mg total) by mouth 3 (three) times daily.   LINZESS 290 MCG CAPS capsule TAKE 1 CAPSULE BY MOUTH EVERY DAY BEFORE BREAKFAST   metFORMIN (GLUCOPHAGE) 500 MG tablet TAKE 1 TABLET (500 MG TOTAL) BY MOUTH 2 (TWO) TIMES DAILY WITH A MEAL. (Patient taking differently: Take 500 mg by mouth daily with breakfast.)   metoprolol succinate (TOPROL-XL) 25 MG 24 hr tablet TAKE 1 TABLET (25 MG TOTAL) BY MOUTH DAILY.   omeprazole (PRILOSEC) 20 MG capsule Take 20 mg by mouth daily.   ondansetron (ZOFRAN) 4 MG tablet TAKE 1 TABLET BY MOUTH EVERY 8 HOURS AS NEEDED   oxyCODONE-acetaminophen (PERCOCET) 5-325 MG tablet Take 1 tablet by mouth every 4 (four) hours as needed for severe pain.   rivastigmine (EXELON) 3 MG capsule Take 3 mg by mouth 2 (two) times daily. (Patient not taking: Reported on 12/26/2021)   traZODone (DESYREL) 50 MG tablet TAKE 1 TABLET (50 MG TOTAL) BY MOUTH AT BEDTIME AS NEEDED.   triamcinolone ointment (KENALOG) 0.5 % APPLY TO AFFECTED AREA TWICE A DAY (Patient not taking: Reported on 12/26/2021)   No facility-administered medications prior to visit.    Review of Systems  Constitutional:  Negative for appetite change, chills, fatigue and fever.  Respiratory:  Negative for chest tightness and shortness of breath.   Cardiovascular:  Negative for chest pain and palpitations.  Gastrointestinal:  Negative for abdominal pain, nausea and vomiting.  Neurological:  Negative for dizziness and weakness.   {Labs   Heme   Chem   Endocrine   Serology   Results Review (optional):23779}   Objective    There were no vitals taken for this visit. {Show previous vital signs (optional):23777}  Physical Exam  ***  No results found for any visits on 01/29/22.  Assessment & Plan     ***  No follow-ups on file.      {provider attestation***:1}   [name], MD  [facility] [phone] (phone) [phone] (fax)  [place]

## 2022-01-29 ENCOUNTER — Ambulatory Visit: Payer: Medicare Other | Admitting: Family Medicine

## 2022-01-30 ENCOUNTER — Ambulatory Visit (INDEPENDENT_AMBULATORY_CARE_PROVIDER_SITE_OTHER): Payer: Medicare Other | Admitting: Podiatry

## 2022-01-30 ENCOUNTER — Other Ambulatory Visit: Payer: Self-pay

## 2022-01-30 DIAGNOSIS — M216X2 Other acquired deformities of left foot: Secondary | ICD-10-CM

## 2022-01-30 DIAGNOSIS — Z9889 Other specified postprocedural states: Secondary | ICD-10-CM

## 2022-01-30 NOTE — Progress Notes (Signed)
° °  Subjective:  Patient presents today status post left fifth metatarsal osteotomy. DOS: 01/15/2022.  Patient states that she is doing well.  She continues to have some pain and tenderness associated to the surgical site.  Otherwise no new complaints at this time.  She has been weightbearing in the surgical shoe as instructed  Past Medical History:  Diagnosis Date   Anemia    Arthritis    COVID-19 11/19/2019   Diverticulitis    DM (diabetes mellitus) ([place])    typ e 2   History of methicillin resistant staphylococcus aureus (MRSA) 2017   HTN (hypertension)    Hyperlipidemia    Memory difficulties 09/01/2015   Multiple thyroid nodules    Myocardial infarction ([place]) 1995   Short-term memory loss    Thyroid nodule       Objective/Physical Exam Neurovascular status intact.  Skin incision appears well coapted with sutures. No sign of infectious process noted. No dehiscence. No active bleeding noted.  No edema noted.  Radiographic Exam:  Osteotomy site to the fifth metatarsal appears clean with dorsal medial translocation of the capital fragment of the fifth metatarsal.  No hardware in the fifth metatarsal noted.  There is an orthopedic screw to the head of the second metatarsal as well as arthroplasty with implant the first MTP which appears stable and intact  Assessment: 1. s/p fifth metatarsal osteotomy without internal fixation left. DOS: 01/15/2022   Plan of Care:  1. Patient was evaluated. X-rays reviewed 2.  Patient may begin washing and showering and getting the foot wet. 3.  Continue weightbearing minimally in the surgical shoe 4.  Sutures were removed.  No complication noted. 5.  I will see her back for final follow-up in 6 weeks.

## 2022-02-06 ENCOUNTER — Encounter: Payer: Medicare Other | Admitting: Podiatry

## 2022-02-07 DIAGNOSIS — Z9884 Bariatric surgery status: Secondary | ICD-10-CM | POA: Diagnosis not present

## 2022-02-12 ENCOUNTER — Ambulatory Visit (INDEPENDENT_AMBULATORY_CARE_PROVIDER_SITE_OTHER): Payer: Medicare Other

## 2022-02-12 DIAGNOSIS — I10 Essential (primary) hypertension: Secondary | ICD-10-CM

## 2022-02-12 DIAGNOSIS — E1122 Type 2 diabetes mellitus with diabetic chronic kidney disease: Secondary | ICD-10-CM

## 2022-02-12 DIAGNOSIS — E782 Mixed hyperlipidemia: Secondary | ICD-10-CM

## 2022-02-12 DIAGNOSIS — Z9181 History of falling: Secondary | ICD-10-CM

## 2022-02-12 NOTE — Chronic Care Management (AMB) (Unsigned)
Chronic Care Management   CCM RN Visit Note  02/12/2022 Name: Julie Acevedo MRN: [id number] DOB: [birth year]  Subjective: Julie Acevedo is a 71 y.o. year old female who is a primary care patient of Jerrol Banana., MD. The care management team was consulted for assistance with disease management and care coordination needs.    Engaged with patient by telephone for follow up visit in response to provider referral for case management and care coordination services.   Consent to Services:  The patient was given information about Chronic Care Management services, agreed to services, and gave verbal consent prior to initiation of services.  Please see initial visit note for detailed documentation.   Assessment: Review of patient past medical history, allergies, medications, health status, including review of consultants reports, laboratory and other test data, was performed as part of comprehensive evaluation and provision of chronic care management services.   SDOH (Social Determinants of Health) assessments and interventions performed: No  CCM Care Plan  Allergies  Allergen Reactions   Lotrel [Amlodipine Besy-Benazepril Hcl] Anaphylaxis   Contrast Media [Iodinated Contrast Media] Swelling and Other (See Comments)    Reaction:  Neck swelling    Niacin Hives   Sulfa Antibiotics Hives    Outpatient Encounter Medications as of 02/12/2022  Medication Sig Note   aspirin EC 81 MG EC tablet Take 1 tablet (81 mg total) by mouth daily. Swallow whole.    atorvastatin (LIPITOR) 40 MG tablet Take 1 tablet (40 mg total) by mouth daily.    calcitRIOL (ROCALTROL) 0.25 MCG capsule Take 0.25 mcg by mouth daily.    cyclobenzaprine (FLEXERIL) 5 MG tablet TAKE 1 TABLET BY MOUTH THREE TIMES A DAY AS NEEDED    LINZESS 290 MCG CAPS capsule TAKE 1 CAPSULE BY MOUTH EVERY DAY BEFORE BREAKFAST    metFORMIN (GLUCOPHAGE) 500 MG tablet TAKE 1 TABLET (500 MG TOTAL) BY MOUTH 2 (TWO) TIMES DAILY WITH A MEAL.  (Patient taking differently: Take 500 mg by mouth daily with breakfast.) 02/12/2022: Reports taking 500 mg once daily.   metoprolol succinate (TOPROL-XL) 25 MG 24 hr tablet TAKE 1 TABLET (25 MG TOTAL) BY MOUTH DAILY.    omeprazole (PRILOSEC) 20 MG capsule Take 20 mg by mouth daily.    ondansetron (ZOFRAN) 4 MG tablet TAKE 1 TABLET BY MOUTH EVERY 8 HOURS AS NEEDED    oxyCODONE-acetaminophen (PERCOCET) 5-325 MG tablet Take 1 tablet by mouth every 4 (four) hours as needed for severe pain.    rivastigmine (EXELON) 3 MG capsule Take 3 mg by mouth 2 (two) times daily.    traMADol (ULTRAM) 50 MG tablet Take 1 tablet (50 mg total) by mouth every 6 (six) hours as needed.    traZODone (DESYREL) 50 MG tablet TAKE 1 TABLET (50 MG TOTAL) BY MOUTH AT BEDTIME AS NEEDED.    gabapentin (NEURONTIN) 300 MG capsule Take 1 capsule (300 mg total) by mouth 3 (three) times daily.    hydrOXYzine (VISTARIL) 100 MG capsule Take 1 capsule (100 mg total) by mouth 3 (three) times daily.    triamcinolone ointment (KENALOG) 0.5 % APPLY TO AFFECTED AREA TWICE A DAY (Patient not taking: Reported on 12/26/2021)    No facility-administered encounter medications on file as of 02/12/2022.    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, psychotic disturbance, mood disturbance, or anxiety ([place]) 11/22/2021   TIA (transient ischemic attack) 09/05/2021   Stomatitis and mucositis  07/21/2021   Cheilitis 07/21/2021   Callus of foot 01/19/2021   Pes planus 01/19/2021   PTTD (posterior tibial tendon dysfunction) 01/19/2021   Acute flank pain 12/15/2020   Cystitis 12/15/2020   Urinary symptom or sign 12/15/2020   Low back pain radiating to right lower extremity 06/23/2020   Gastroesophageal reflux disease 04/28/2020   Large bowel obstruction ([place]) 04/17/2019   Acquired trigger finger 03/20/2019   Iron deficiency anemia 11/21/2018   Syncope 03/18/2018   Anemia 03/18/2018    Exophthalmos 02/22/2018   Multiple thyroid nodules 02/22/2018   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   Pain at surgical site 08/30/2016   Ventral incisional hernia 08/21/2016   SBO (small bowel obstruction) (HCC)    Partial small bowel obstruction ([place]) 04/23/2016   Adjustment disorder with mixed anxiety and depressed mood 04/23/2016   Memory difficulties 09/01/2015   Arthritis 06/11/2015   Back pain, chronic 06/11/2015   HLD (hyperlipidemia) 06/11/2015   Cannot sleep 06/11/2015   L-S radiculopathy 06/11/2015   Arthritis of knee, degenerative 06/11/2015   B12 deficiency 06/11/2015   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   [facility] Care Management [facility] [phone]

## 2022-02-12 NOTE — Patient Instructions (Signed)
Thank you for allowing the Chronic Care Management team to participate in your care.  It was great speaking with you today!   Our next appointment is on March 31 at 1000. Please call the care guide team at [phone] if you need to cancel or reschedule your appointment.    Please don't hesitate to contact me if you require assistance prior to our next telephone appointment.   [facility] Care Management [facility] [phone]

## 2022-02-13 ENCOUNTER — Other Ambulatory Visit: Payer: Self-pay

## 2022-02-13 ENCOUNTER — Ambulatory Visit (INDEPENDENT_AMBULATORY_CARE_PROVIDER_SITE_OTHER): Payer: Medicare Other | Admitting: Family Medicine

## 2022-02-13 VITALS — BP 132/88 | HR 88 | Wt 122.0 lb

## 2022-02-13 DIAGNOSIS — E538 Deficiency of other specified B group vitamins: Secondary | ICD-10-CM

## 2022-02-13 DIAGNOSIS — F02A0 Dementia in other diseases classified elsewhere, mild, without behavioral disturbance, psychotic disturbance, mood disturbance, and anxiety: Secondary | ICD-10-CM | POA: Diagnosis not present

## 2022-02-13 DIAGNOSIS — K219 Gastro-esophageal reflux disease without esophagitis: Secondary | ICD-10-CM

## 2022-02-13 DIAGNOSIS — E559 Vitamin D deficiency, unspecified: Secondary | ICD-10-CM | POA: Diagnosis not present

## 2022-02-13 DIAGNOSIS — E1122 Type 2 diabetes mellitus with diabetic chronic kidney disease: Secondary | ICD-10-CM

## 2022-02-13 DIAGNOSIS — I10 Essential (primary) hypertension: Secondary | ICD-10-CM | POA: Diagnosis not present

## 2022-02-13 DIAGNOSIS — N1832 Chronic kidney disease, stage 3b: Secondary | ICD-10-CM

## 2022-02-13 DIAGNOSIS — E782 Mixed hyperlipidemia: Secondary | ICD-10-CM

## 2022-02-13 DIAGNOSIS — G3183 Dementia with Lewy bodies: Secondary | ICD-10-CM | POA: Diagnosis not present

## 2022-02-13 DIAGNOSIS — R413 Other amnesia: Secondary | ICD-10-CM | POA: Diagnosis not present

## 2022-02-13 LAB — POCT GLYCOSYLATED HEMOGLOBIN (HGB A1C): Hemoglobin A1C: 5.1 % (ref 4.0–5.6)

## 2022-02-13 NOTE — Progress Notes (Signed)
Argentina Ponder DeSanto,acting as a scribe for [name], MD.,have documented all relevant documentation on the behalf of [name], MD,as directed by  [name], MD while in the presence of [name], MD.     Established patient visit   Patient: Julie Acevedo   DOB: Nov 26, 1951   71 y.o. Female  MRN: [id number] Visit Date: 02/13/2022  Today's healthcare provider: [name], MD   No chief complaint on file.  Subjective    HPI  She comes in today for follow-up after having left foot surgery 2 weeks ago.  She still has a good bit of pain.  Overall she is feeling fairly well other than the foot pain.  She is married and has 3 daughters and 3 grandchildren. She is having no problems with her medications and is having no hypoglycemia. Mentally and emotionally she states she is stable.  She has been followed by [facility] clinic neurology for the body dementia without behavioral disturbance Hypertension, follow-up  BP Readings from Last 3 Encounters:  02/13/22 132/88  01/23/22 109/60  12/15/21 (!) 155/72   Wt Readings from Last 3 Encounters:  02/13/22 122 lb (55.3 kg)  12/15/21 126 lb (57.2 kg)  11/30/21 126 lb 6.4 oz (57.3 kg)     She was last seen for hypertension 5 months ago.  BP at that visit was 120/60. Management since that visit includes none.  She reports good compliance with treatment. She is not having side effects.  She is following a Regular diet. She is not exercising. She does not smoke.  Use of agents associated with hypertension: none.   Outside blood pressures are being checked but patientn can only say they are running low. Symptoms: No chest pain No chest pressure  No palpitations No syncope  No dyspnea No orthopnea  No paroxysmal nocturnal dyspnea No lower extremity edema   Pertinent labs: Lab Results  Component Value Date   CHOL 134 09/06/2021   HDL 60 09/06/2021   LDLCALC 63 09/06/2021   TRIG 54 09/06/2021    CHOLHDL 2.2 09/06/2021   Lab Results  Component Value Date   NA 141 09/13/2021   K 4.4 09/13/2021   CREATININE 1.42 (H) 09/13/2021   EGFR 40 (L) 09/13/2021   GLUCOSE 72 09/13/2021   TSH 1.896 06/28/2021     The ASCVD Risk score (Arnett DK, et al., 2019) failed to calculate for the following reasons:   The patient has a prior MI or stroke diagnosis   --------------------------------------------------------------------------------------------------- Diabetes Mellitus Type II, Follow-up  Lab Results  Component Value Date   HGBA1C 5.6 09/06/2021   HGBA1C 5.3 08/29/2021   HGBA1C 5.5 03/06/2021   Wt Readings from Last 3 Encounters:  02/13/22 122 lb (55.3 kg)  12/15/21 126 lb (57.2 kg)  11/30/21 126 lb 6.4 oz (57.3 kg)   Last seen for diabetes 5 months ago.  Management since then includes none. She reports good compliance with treatment. She is not having side effects.  Symptoms: No fatigue No foot ulcerations  Yes appetite changes No nausea  No paresthesia of the feet  No polydipsia  No polyuria No visual disturbances   No vomiting     Home blood sugar records:  70-140  Episodes of hypoglycemia? No    Current insulin regiment: none   Pertinent Labs: Lab Results  Component Value Date   CHOL 134 09/06/2021   HDL 60 09/06/2021   LDLCALC 63 09/06/2021   TRIG 54  09/06/2021   CHOLHDL 2.2 09/06/2021   Lab Results  Component Value Date   NA 141 09/13/2021   K 4.4 09/13/2021   CREATININE 1.42 (H) 09/13/2021   EGFR 40 (L) 09/13/2021   MICROALBUR 20 06/23/2020   LABMICR See below: 06/28/2015     ---------------------------------------------------------------------------------------------------   Medications: Outpatient Medications Prior to Visit  Medication Sig   aspirin EC 81 MG EC tablet Take 1 tablet (81 mg total) by mouth daily. Swallow whole.   atorvastatin (LIPITOR) 40 MG tablet Take 1 tablet (40 mg total) by mouth daily.   calcitRIOL (ROCALTROL) 0.25  MCG capsule Take 0.25 mcg by mouth daily.   cyclobenzaprine (FLEXERIL) 5 MG tablet TAKE 1 TABLET BY MOUTH THREE TIMES A DAY AS NEEDED   hydrOXYzine (VISTARIL) 100 MG capsule Take 1 capsule (100 mg total) by mouth 3 (three) times daily.   LINZESS 290 MCG CAPS capsule TAKE 1 CAPSULE BY MOUTH EVERY DAY BEFORE BREAKFAST   metFORMIN (GLUCOPHAGE) 500 MG tablet TAKE 1 TABLET (500 MG TOTAL) BY MOUTH 2 (TWO) TIMES DAILY WITH A MEAL. (Patient taking differently: Take 500 mg by mouth daily with breakfast.)   metoprolol succinate (TOPROL-XL) 25 MG 24 hr tablet TAKE 1 TABLET (25 MG TOTAL) BY MOUTH DAILY.   omeprazole (PRILOSEC) 20 MG capsule Take 20 mg by mouth daily.   ondansetron (ZOFRAN) 4 MG tablet TAKE 1 TABLET BY MOUTH EVERY 8 HOURS AS NEEDED   oxyCODONE-acetaminophen (PERCOCET) 5-325 MG tablet Take 1 tablet by mouth every 4 (four) hours as needed for severe pain.   rivastigmine (EXELON) 3 MG capsule Take 3 mg by mouth 2 (two) times daily.   traMADol (ULTRAM) 50 MG tablet Take 1 tablet (50 mg total) by mouth every 6 (six) hours as needed.   traZODone (DESYREL) 50 MG tablet TAKE 1 TABLET (50 MG TOTAL) BY MOUTH AT BEDTIME AS NEEDED.   triamcinolone ointment (KENALOG) 0.5 % APPLY TO AFFECTED AREA TWICE A DAY   gabapentin (NEURONTIN) 300 MG capsule Take 1 capsule (300 mg total) by mouth 3 (three) times daily.   No facility-administered medications prior to visit.    Review of Systems     Objective    BP 132/88 (BP Location: Right Arm, Patient Position: Sitting, Cuff Size: Normal)    Pulse 88    Wt 122 lb (55.3 kg)    SpO2 100%    BMI 21.61 kg/m     Physical Exam Vitals reviewed.  Constitutional:      Appearance: She is well-developed.  HENT:     Head: Normocephalic and atraumatic.     Right Ear: Tympanic membrane and external ear normal.     Left Ear: Tympanic membrane and external ear normal.     Nose: Nose normal.     Mouth/Throat:     Mouth: Mucous membranes are moist.     Pharynx:  Oropharynx is clear.  Eyes:     General: No scleral icterus.    Conjunctiva/sclera: Conjunctivae normal.     Pupils: Pupils are equal, round, and reactive to light.  Neck:     Thyroid: No thyromegaly.  Cardiovascular:     Rate and Rhythm: Normal rate and regular rhythm.     Heart sounds: Normal heart sounds.  Pulmonary:     Effort: Pulmonary effort is normal.     Breath sounds: Normal breath sounds.  Abdominal:     Palpations: Abdomen is soft.  Lymphadenopathy:     Cervical: No cervical adenopathy.  Skin:    General: Skin is warm and dry.  Neurological:     General: No focal deficit present.     Mental Status: She is alert and oriented to person, place, and time.  Psychiatric:        Mood and Affect: Mood normal.        Behavior: Behavior normal.      No results found for any visits on 02/13/22.  Assessment & Plan     1. Type 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, without long-term current use of insulin (HCC) 1C is 5.1 today.  At this time we will discontinue metformin in this patient who is lost considerable weight after gastric surgery - POCT glycosylated hemoglobin (Hb A1C)  2. Benign essential HTN Excellent control of blood pressure  3. Gastroesophageal reflux disease, unspecified whether esophagitis present stable  4. 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  Note from a month ago said she was undergoing evaluation as to whether she should drive or not. 5. Memory difficulties MSE on next visit  6. B12 deficiency   7. Avitaminosis D Levels when appropriate on next lab draw   No follow-ups on file.      I, [name], MD, have reviewed all documentation for this visit. The documentation on 02/16/22 for the exam, diagnosis, procedures, and orders are all accurate and complete.    Elnoria Livingston Cranford Mon, MD  [facility] [phone] (phone) [phone] (fax)  [place]

## 2022-02-13 NOTE — Patient Instructions (Signed)
Stop Metformin

## 2022-02-14 ENCOUNTER — Other Ambulatory Visit: Payer: Self-pay | Admitting: Family Medicine

## 2022-02-14 NOTE — Telephone Encounter (Signed)
Requested medication (s) are due for refill today:   Provider to review ? ?Requested medication (s) are on the active medication list:   Yes ? ?Future visit scheduled:   Yes    Was seen yesterday ? ? ?Last ordered: 10/19/2021 #20, 0 refills ? ?Returned because this is a non delegated refill.  ? ?Requested Prescriptions  ?Pending Prescriptions Disp Refills  ? ondansetron (ZOFRAN) 4 MG tablet [Pharmacy Med Name: ONDANSETRON HCL 4 MG TABLET] 20 tablet 0  ?  Sig: TAKE 1 TABLET BY MOUTH EVERY 8 HOURS AS NEEDED  ?  ? Not Delegated - Gastroenterology: Antiemetics - ondansetron Failed - 02/14/2022 12:47 PM  ?  ?  Failed - This refill cannot be delegated  ?  ?  Passed - AST in normal range and within 360 days  ?  AST  ?Date Value Ref Range Status  ?09/05/2021 17 15 - 41 U/L Final  ? ?SGOT(AST)  ?Date Value Ref Range Status  ?01/17/2014 20 15 - 37 Unit/L Final  ?  ?  ?  ?  Passed - ALT in normal range and within 360 days  ?  ALT  ?Date Value Ref Range Status  ?09/05/2021 7 0 - 44 U/L Final  ? ?SGPT (ALT)  ?Date Value Ref Range Status  ?01/17/2014 21 12 - 78 U/L Final  ?  ?  ?  ?  Passed - Valid encounter within last 6 months  ?  Recent Outpatient Visits   ? ?      ? Yesterday Type 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, without long-term current use of insulin ([place])  ? [facility] Jerrol Banana., MD  ? 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 [facility] Thedore Mins, Ria Comment, PA-C  ? 5 months ago Syncope, unspecified syncope type  ? [facility] Jerrol Banana., MD  ? 5 months ago Type 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, without long-term current use of insulin ([place])  ? [facility] Jerrol Banana., MD  ? 6 months ago Stomatitis and mucositis  ? [facility] Tally Joe T, FNP  ? ?  ?  ?Future Appointments   ? ?         ? In 3 months Jerrol Banana., MD [facility]  ? ?  ? ?  ?  ?  ? ?

## 2022-03-08 ENCOUNTER — Other Ambulatory Visit: Payer: Self-pay | Admitting: Family Medicine

## 2022-03-08 DIAGNOSIS — K130 Diseases of lips: Secondary | ICD-10-CM

## 2022-03-09 NOTE — Telephone Encounter (Signed)
Requested medication (s) are due for refill today: yes ? ?Requested medication (s) are on the active medication list: yes ? ?Last refill:  11/14/21 ? ?Future visit scheduled: yes ? ?Notes to clinic:  Please review for refill. Refill not delegated per protocol ? ? ? ?Requested Prescriptions  ?Pending Prescriptions Disp Refills  ? triamcinolone ointment (KENALOG) 0.5 % [Pharmacy Med Name: TRIAMCINOLONE 0.5% OINTMENT] 30 g 0  ?  Sig: APPLY TO AFFECTED AREA TWICE A DAY  ?  ? Not Delegated - Dermatology:  Corticosteroids Failed - 03/08/2022  9:14 PM  ?  ?  Failed - This refill cannot be delegated  ?  ?  Passed - Valid encounter within last 12 months  ?  Recent Outpatient Visits   ? ?      ? 3 weeks ago Type 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, without long-term current use of insulin ([place])  ? [facility] Jerrol Banana., MD  ? 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 [facility] Thedore Mins, Ria Comment, PA-C  ? 5 months ago Syncope, unspecified syncope type  ? [facility] Jerrol Banana., MD  ? 6 months ago Type 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, without long-term current use of insulin ([place])  ? [facility] Jerrol Banana., MD  ? 7 months ago Stomatitis and mucositis  ? [facility] Tally Joe T, FNP  ? ?  ?  ?Future Appointments   ? ?        ? In 3 months Jerrol Banana., MD [facility]  ? ?  ? ?  ?  ?  ? ?

## 2022-03-13 ENCOUNTER — Ambulatory Visit (INDEPENDENT_AMBULATORY_CARE_PROVIDER_SITE_OTHER): Payer: Medicare Other | Admitting: Podiatry

## 2022-03-13 ENCOUNTER — Ambulatory Visit (INDEPENDENT_AMBULATORY_CARE_PROVIDER_SITE_OTHER): Payer: Medicare Other

## 2022-03-13 ENCOUNTER — Other Ambulatory Visit: Payer: Self-pay

## 2022-03-13 DIAGNOSIS — M216X2 Other acquired deformities of left foot: Secondary | ICD-10-CM

## 2022-03-13 DIAGNOSIS — Z9889 Other specified postprocedural states: Secondary | ICD-10-CM

## 2022-03-13 NOTE — Progress Notes (Signed)
? ?  Subjective:  ?Patient presents today status post left fifth metatarsal osteotomy. DOS: 01/15/2022.  Patient states that she is doing well.  She continues to have some pain and tenderness associated to the surgical site.  Otherwise no new complaints at this time.  She has been weightbearing in the surgical shoe as instructed ? ?Past Medical History:  ?Diagnosis Date  ? Anemia   ? Arthritis   ? COVID-19 11/19/2019  ? Diverticulitis   ? DM (diabetes mellitus) ([place])   ? typ e 2  ? History of methicillin resistant staphylococcus aureus (MRSA) 2017  ? HTN (hypertension)   ? Hyperlipidemia   ? Memory difficulties 09/01/2015  ? Multiple thyroid nodules   ? Myocardial infarction [facility]) 1995  ? Short-term memory loss   ? Thyroid nodule   ? ?  ? ?Objective/Physical Exam ?Neurovascular status intact.  Skin incision appears well coapted with sutures. No sign of infectious process noted. No dehiscence. No active bleeding noted.  No edema noted. ? ?Radiographic Exam:  ?Osteotomy site to the fifth metatarsal appears clean with dorsal medial translocation of the capital fragment of the fifth metatarsal.  Bone callus noted.  No hardware in the fifth metatarsal noted.  There is an orthopedic screw to the head of the second metatarsal as well as arthroplasty with implant the first MTP which appears stable and intact ? ?Assessment: ?1. s/p fifth metatarsal osteotomy without internal fixation left. DOS: 01/15/2022 ? ? ?Plan of Care:  ?1.  Patient is officially discharged from my care.  If any foot and ankle issues arises I have asked her to come see me.Foot:  However callus formation has completely resolved.  No acute complaints. ? ? ? ? ? ? ? ? ?

## 2022-03-16 ENCOUNTER — Ambulatory Visit (INDEPENDENT_AMBULATORY_CARE_PROVIDER_SITE_OTHER): Payer: Medicare Other

## 2022-03-16 DIAGNOSIS — E1122 Type 2 diabetes mellitus with diabetic chronic kidney disease: Secondary | ICD-10-CM

## 2022-03-16 DIAGNOSIS — N1832 Chronic kidney disease, stage 3b: Secondary | ICD-10-CM

## 2022-03-16 DIAGNOSIS — I10 Essential (primary) hypertension: Secondary | ICD-10-CM

## 2022-03-16 DIAGNOSIS — Z9181 History of falling: Secondary | ICD-10-CM

## 2022-03-16 NOTE — Chronic Care Management (AMB) (Signed)
?Chronic Care Management  ? ?CCM RN Visit Note ? ?03/16/2022 ?Name: Julie Acevedo MRN: [id number] DOB: 1951-06-12 ? ?Subjective: ?Julie Acevedo is a 71 y.o. year old female who is a primary care patient of Julie Acevedo., MD. The care management team was consulted for assistance with disease management and care coordination needs.   ? ?Engaged with patient by telephone for follow up visit in response to provider referral for case management and care coordination services.  ? ?Consent to Services:  ?The patient was given information about Chronic Care Management services, agreed to services, and gave verbal consent prior to initiation of services.  Please see initial visit note for detailed documentation.  ? ?Assessment: Review of patient past medical history, allergies, medications, health status, including review of consultants reports, laboratory and other test data, was performed as part of comprehensive evaluation and provision of chronic care management services.  ? ?SDOH (Social Determinants of Health) assessments and interventions performed: No ? ?CCM Care Plan ? ?Allergies  ?Allergen Reactions  ? Lotrel [Amlodipine Besy-Benazepril Hcl] Anaphylaxis  ? Contrast Media [Iodinated Contrast Media] Swelling and Other (See Comments)  ?  Reaction:  Neck swelling   ? Niacin Hives  ? Sulfa Antibiotics Hives  ? ? ?Outpatient Encounter Medications as of 03/16/2022  ?Medication Sig Note  ? aspirin EC 81 MG EC tablet Take 1 tablet (81 mg total) by mouth daily. Swallow whole.   ? atorvastatin (LIPITOR) 40 MG tablet Take 1 tablet (40 mg total) by mouth daily.   ? calcitRIOL (ROCALTROL) 0.25 MCG capsule Take 0.25 mcg by mouth daily.   ? cyclobenzaprine (FLEXERIL) 5 MG tablet TAKE 1 TABLET BY MOUTH THREE TIMES A DAY AS NEEDED   ? gabapentin (NEURONTIN) 300 MG capsule Take 1 capsule (300 mg total) by mouth 3 (three) times daily.   ? hydrOXYzine (VISTARIL) 100 MG capsule Take 1 capsule (100 mg total) by mouth 3 (three) times  daily.   ? LINZESS 290 MCG CAPS capsule TAKE 1 CAPSULE BY MOUTH EVERY DAY BEFORE BREAKFAST   ? metFORMIN (GLUCOPHAGE) 500 MG tablet TAKE 1 TABLET (500 MG TOTAL) BY MOUTH 2 (TWO) TIMES DAILY WITH A MEAL. (Patient taking differently: Take 500 mg by mouth daily with breakfast.) 02/12/2022: Reports taking 500 mg once daily.  ? metoprolol succinate (TOPROL-XL) 25 MG 24 hr tablet TAKE 1 TABLET (25 MG TOTAL) BY MOUTH DAILY.   ? omeprazole (PRILOSEC) 20 MG capsule Take 20 mg by mouth daily.   ? ondansetron (ZOFRAN) 4 MG tablet TAKE 1 TABLET BY MOUTH EVERY 8 HOURS AS NEEDED   ? oxyCODONE-acetaminophen (PERCOCET) 5-325 MG tablet Take 1 tablet by mouth every 4 (four) hours as needed for severe pain.   ? rivastigmine (EXELON) 3 MG capsule Take 3 mg by mouth 2 (two) times daily.   ? traMADol (ULTRAM) 50 MG tablet Take 1 tablet (50 mg total) by mouth every 6 (six) hours as needed.   ? traZODone (DESYREL) 50 MG tablet TAKE 1 TABLET (50 MG TOTAL) BY MOUTH AT BEDTIME AS NEEDED.   ? triamcinolone ointment (KENALOG) 0.5 % APPLY TO AFFECTED AREA TWICE A DAY   ? ?No facility-administered encounter medications on file as of 03/16/2022.  ? ? ?Patient Active Problem List  ? Diagnosis Date Noted  ? 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 TIA (transient ischemic attack) 09/05/2021  ? Stomatitis and mucositis 07/21/2021  ? Cheilitis 07/21/2021  ?  Callus of foot 01/19/2021  ? Pes planus 01/19/2021  ? PTTD (posterior tibial tendon dysfunction) 01/19/2021  ? Acute flank pain 12/15/2020  ? Cystitis 12/15/2020  ? Urinary symptom or sign 12/15/2020  ? Low back pain radiating to right lower extremity 06/23/2020  ? Gastroesophageal reflux disease 04/28/2020  ? Large bowel obstruction ([place]) 04/17/2019  ? Acquired trigger finger 03/20/2019  ? Iron deficiency anemia 11/21/2018  ? Syncope 03/18/2018  ? Anemia 03/18/2018  ? Exophthalmos 02/22/2018  ? Multiple thyroid  nodules 02/22/2018  ? 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  ? Pain at surgical site 08/30/2016  ? Ventral incisional hernia 08/21/2016  ? SBO (small bowel obstruction) ([place])   ? Partial small bowel obstruction ([place]) 04/23/2016  ? Adjustment disorder with mixed anxiety and depressed mood 04/23/2016  ? Memory difficulties 09/01/2015  ? Arthritis 06/11/2015  ? Back pain, chronic 06/11/2015  ? HLD (hyperlipidemia) 06/11/2015  ? Cannot sleep 06/11/2015  ? L-S radiculopathy 06/11/2015  ? Arthritis of knee, degenerative 06/11/2015  ? B12 deficiency 06/11/2015  ? 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  ? 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 DM2 (diabetes mellitus, type 2) ([place]) 11/15/2012  ? Avitaminosis D 11/07/2009  ? Narrowing of intervertebral disc space 07/25/2009  ? Benign essential HTN 07/25/2009  ? ? ?Patient Care Plan: RN Care Management Plan of Care  ?  ? ?Problem Identified: DM, HLD, HTN and Fall Risk   ?  ? ?Long-Range Goal: Disease Progression Prevented or Minimized   ?Start Date: 12/12/2021  ?Expected End Date: 03/12/2022  ?Priority: High  ?Note:   ?Current Barriers:  ?Chronic Disease Management support and education needs related to HTN, HLD, DMII, and Fall Risk. ? ?RNCM Clinical Goal(s):  ?Patient will demonstrate ongoing adherence to prescribed treatment plan for HTN, HLD, DMII, and Fall Risk through collaboration with the provider, RN Care manager and the care team.  ? ?Interventions: ?1:1 collaboration with primary care provider regarding development and update of comprehensive plan of care as evidenced by provider attestation and co-signature ?Inter-disciplinary care team collaboration (see longitudinal plan of care) ?Evaluation of current treatment plan related to  self management and patient's adherence to plan as established by provider ? ? ?Diabetes Interventions:   ?Lab Results  ?Component Value Date  ? HGBA1C 5.1 02/13/2022  ?  ?Reviewed plan for diabetes management. Reports  taking medications as prescribed. ?Reviewed blood glucose readings. Reviewed s/sx of hypoglycemia and hyperglycemia. Reports fasting readings have ranged from the low 70's to 120's. She experiences symptoms related to hypoglycemia when her blood glucose readings are in the 70's. Thorough discussion regarding appropriate interventions to increase levels. Reports having snacks and fast acting sugars on hand to consume when needed. Advised to continue monitoring readings and maintain a log. ?Discussed nutritional intake. Reports monitoring carb intake as advised. Reports tolerating small meals without difficulty. Discussed importance of maintaining adequate intake to prevent hypoglycemic episodes. Declines need for nutritional assistance. and importance of complying with the recommended ADA/modified carb diet.  ?Discussed available diabetic and nutritional resources. Declined current need for additional assistance. Her A1C is currently at goal. ? ? ?Falls Interventions:   ?Reviewed medications and discussed potential side effects that may increase risk of falls. ?Reviewed safety and fall prevention measures. Denies recent falls. Reports recently completing surgery on her left foot. Reports wearing medical boot and following activity restrictions as advised by the Podiatry team. Reports using her walker as advised. ?Reviewed ability to perform ADLs and tasks in the home.  Reports completing ADLs independently. Reports good support from spouse and family. Declined need for additional assistance. ?Discussed importance of completing follow up with the Podiatry team as scheduled. ? ? ?Hyperlipidemia Interventions:   ?Reviewed plan for hyperlipidemia management. Reports adhering to recommended diet. Her activity level and ability to exercise is limited. She will follow up for a clinic/provider visit on 02/13/22. Advised to complete regular labs as recommended. ? ? ?Hypertension Interventions:   ?BP Readings from Last 3  Encounters:  ?02/13/22 132/88  ?01/23/22 109/60  ?12/15/21 (!) 155/72  ?  ? ?Reviewed plan for hypertension management. Reports taking medication as prescribed.  ?Reviewed established BP parameters and home readi

## 2022-03-19 ENCOUNTER — Other Ambulatory Visit: Payer: Self-pay | Admitting: Family Medicine

## 2022-03-20 ENCOUNTER — Telehealth: Payer: Self-pay

## 2022-03-20 NOTE — Progress Notes (Signed)
? ? ?Chronic Care Management ?Pharmacy Assistant  ? ?Name: Julie Acevedo  MRN: [id number] DOB: 01-14-1951 ? ?Reason for Encounter: Hypertension Disease State Call ?  ?Recent office visits:  ?02/13/2022 Raynald Kemp (PCP Office Visit) for Type II DM- Stopped: Metformin 500 mg, lab orders placed, No follow-up noted ? ?Recent consult visits:  ?03/13/2022 Boneta Lucks, DPM (Podiatry) for Routine Post Op- No medication changes noted, No orders placed, No follow-up noted ? ?02/07/2022 Hassie Bruce, MD (Gastroenterology) for Follow-up- No medication changes noted, lab orders placed, Patient to follow-up in 6 months ? ?01/23/2022 Laymond Purser, DPM (Podiatry) for Routine Post Op- No medication changes noted, DG Foot order placed, Patient to follow-up in 6 weeks ? ?01/23/2022 Laymond Purser, DPM (Podiatry) for Routine Post Op- No medication changes noted, DG Foot order placed, Patient to follow-up in 1 week ? ?01/18/2022 Lyla Son, MD (Nephrology) for Follow-up- No medication changes noted, No orders placed, patient to follow-up in 4 months ? ?01/12/2022 Leta Speller, OT (Rehab Services) for OT Treatment- No medication changes noted, No orders placed,  ? ?01/12/2022 Ricard Dillon, PT (Physical Therapy) for PT Initial Evaluation- No medication changes noted, No orders placed,  ? ?01/04/2022 Hemang Leeanne Deed, MD (Neurology) for Weakness- Per note provider recommends Reduce cyclobenzaprine to 5 mg at night for 3 days then stop. Talk to primary care provider to discontinue tramadol, No orders placed, patient to return in 4 months ? ?01/03/2022 Leta Speller, OT (Rehab Services) for OT Treatment- No medication changes noted, No orders placed,  ? ?01/03/2022 Ricard Dillon, PT (Physical Therapy) for PT Initial Evaluation- No medication changes noted, No orders placed,  ? ?01/01/2022 Leta Speller, OT (Rehab Services) for OT Treatment- No medication changes noted, No orders placed,  ? ?01/01/2022 Ricard Dillon, PT  (Physical Therapy) for PT Initial Evaluation- No medication changes noted, No orders placed,  ? ?12/29/2021 Leta Speller, OT (Rehab Services) for OT Treatment- No medication changes noted, No orders placed,  ? ?12/29/2021  Ricard Dillon, PT (Physical Therapy) for PT Treatment- No medication changes, No orders placed,  ? ?12/27/2021 Leta Speller, OT (Rehab Services) for OT Treatment- No medication changes noted, No orders placed,  ? ?12/27/2021 Ricard Dillon, PT (Physical Therapy) for PT Initial Evaluation- No medication changes noted, No orders placed,  ? ?Hospital visits:  ?None in previous 6 months ? ?Medications: ?Outpatient Encounter Medications as of 03/20/2022  ?Medication Sig Note  ? aspirin EC 81 MG EC tablet Take 1 tablet (81 mg total) by mouth daily. Swallow whole.   ? atorvastatin (LIPITOR) 40 MG tablet Take 1 tablet (40 mg total) by mouth daily.   ? calcitRIOL (ROCALTROL) 0.25 MCG capsule Take 0.25 mcg by mouth daily.   ? cyclobenzaprine (FLEXERIL) 5 MG tablet TAKE 1 TABLET BY MOUTH THREE TIMES A DAY AS NEEDED   ? gabapentin (NEURONTIN) 300 MG capsule Take 1 capsule (300 mg total) by mouth 3 (three) times daily.   ? hydrOXYzine (VISTARIL) 100 MG capsule Take 1 capsule (100 mg total) by mouth 3 (three) times daily.   ? LINZESS 290 MCG CAPS capsule TAKE 1 CAPSULE BY MOUTH EVERY DAY BEFORE BREAKFAST   ? metFORMIN (GLUCOPHAGE) 500 MG tablet TAKE 1 TABLET (500 MG TOTAL) BY MOUTH 2 (TWO) TIMES DAILY WITH A MEAL. (Patient taking differently: Take 500 mg by mouth daily with breakfast.) 02/12/2022: Reports taking 500 mg once daily.  ? metoprolol succinate (TOPROL-XL) 25 MG 24 hr tablet TAKE 1 TABLET (25 MG TOTAL) BY MOUTH  DAILY.   ? omeprazole (PRILOSEC) 20 MG capsule TAKE 1 CAPSULE BY MOUTH EVERY DAY   ? ondansetron (ZOFRAN) 4 MG tablet TAKE 1 TABLET BY MOUTH EVERY 8 HOURS AS NEEDED   ? oxyCODONE-acetaminophen (PERCOCET) 5-325 MG tablet Take 1 tablet by mouth every 4 (four) hours as needed for severe pain.   ?  rivastigmine (EXELON) 3 MG capsule Take 3 mg by mouth 2 (two) times daily.   ? traMADol (ULTRAM) 50 MG tablet Take 1 tablet (50 mg total) by mouth every 6 (six) hours as needed.   ? traZODone (DESYREL) 50 MG tablet TAKE 1 TABLET (50 MG TOTAL) BY MOUTH AT BEDTIME AS NEEDED.   ? triamcinolone ointment (KENALOG) 0.5 % APPLY TO AFFECTED AREA TWICE A DAY   ? ?No facility-administered encounter medications on file as of 03/20/2022.  ? ?Care Gaps: ?Diabetic Eye Exam ?Urine Microalbumin ? ?Star Rating Drugs: ?Atorvastatin 40 mg last filled on 09/08/2021 for a 90-Day supply with [facility] ?Metformin 500 mg- per last PCP visit this medication was discontinued but it's still listed in her current medication list ? ?Reviewed chart prior to disease state call. Spoke with patient regarding BP ? ?Recent Office Vitals: ?BP Readings from Last 3 Encounters:  ?02/13/22 132/88  ?01/23/22 109/60  ?12/15/21 (!) 155/72  ? ?Pulse Readings from Last 3 Encounters:  ?02/13/22 88  ?01/23/22 70  ?12/15/21 (!) 58  ?  ?Wt Readings from Last 3 Encounters:  ?02/13/22 122 lb (55.3 kg)  ?12/15/21 126 lb (57.2 kg)  ?11/30/21 126 lb 6.4 oz (57.3 kg)  ?  ?Kidney Function ?Lab Results  ?Component Value Date/Time  ? CREATININE 1.42 (H) 09/13/2021 02:33 PM  ? CREATININE 1.15 (H) 09/07/2021 05:32 AM  ? CREATININE 1.35 (H) 10/30/2017 10:58 AM  ? CREATININE 1.15 (H) 08/21/2017 03:32 PM  ? GFRNONAA 52 (L) 09/07/2021 05:32 AM  ? GFRNONAA 41 (L) 10/30/2017 10:58 AM  ? GFRAA 42 (L) 12/05/2020 08:55 AM  ? GFRAA 48 (L) 10/30/2017 10:58 AM  ? ? ?  Latest Ref Rng & Units 09/13/2021  ?  2:33 PM 09/07/2021  ?  5:32 AM 09/05/2021  ?  2:14 PM  ?BMP  ?Glucose 70 - 99 mg/dL 72   112   332    ?BUN 8 - 27 mg/dL '13   11   17    '$ ?Creatinine 0.57 - 1.00 mg/dL 1.42   1.15   1.39    ?BUN/Creat Ratio 12 - 28 9      ?Sodium 134 - 144 mmol/L 141   139   137    ?Potassium 3.5 - 5.2 mmol/L 4.4   3.9   3.3    ?Chloride 96 - 106 mmol/L 102   106   100    ?CO2 20 - 29 mmol/L '26   26   28     '$ ?Calcium 8.7 - 10.3 mg/dL 8.9   9.0   8.6    ? ? ?Current antihypertensive regimen:  ?Metoprolol Succinate 25 mg 1 tablet daily ? ?How often are you checking your Blood Pressure?  Patient doesn't check her BP @ home  but she stated that she has been having a lot of doctors appointments, and her numbers have been normal ? ?Current home BP readings: N/A ? ?What recent interventions/DTPs have been made by any provider to improve Blood Pressure control since last CPP Visit: None ID ? ?Any recent hospitalizations or ED visits since last visit with CPP? No ? ?What  diet changes have been made to improve Blood Pressure Control?  ?Patient stated she has not changed anything in her diet ? ?What exercise is being done to improve your Blood Pressure Control?  ?Patient does not do a routine exercise  ? ?Adherence Review: ?Is the patient currently on ACE/ARB medication? No ?Does the patient have >5 day gap between last estimated fill dates? Yes ? ?I spoke with the patient, and she reports today she is doing well. The patient sounded so much better, and she sounded up beat and happy. The patient reports that her appetite is still not all that good, but she is doing the best she can. Patient stated she has been doing well since her Metformin was discontinued. Patient stated she is taking all medications as directed. Patient has no other concerns or issues at this time. ? ?Patient has a telephone appointment with [name], CPP on 06/25/2022 @ 1100 ? ?Lynann Bologna, CPA/CMA ?Clinical Pharmacist Assistant ?Phone: [phone]  ? ? ?

## 2022-03-26 ENCOUNTER — Telehealth: Payer: Medicare Other

## 2022-03-27 ENCOUNTER — Other Ambulatory Visit: Payer: Self-pay | Admitting: Family Medicine

## 2022-03-27 DIAGNOSIS — K130 Diseases of lips: Secondary | ICD-10-CM

## 2022-03-28 ENCOUNTER — Ambulatory Visit (INDEPENDENT_AMBULATORY_CARE_PROVIDER_SITE_OTHER): Payer: Medicare Other

## 2022-03-28 VITALS — Wt 122.0 lb

## 2022-03-28 DIAGNOSIS — Z0000 Encounter for general adult medical examination without abnormal findings: Secondary | ICD-10-CM

## 2022-03-28 NOTE — Telephone Encounter (Signed)
Requested medication (s) are due for refill today: yes and no ? ?Requested medication (s) are on the active medication list: yes ? ?Last refill:  triamcinolone 03/09/22 #30g/0, ondansetron 02/14/22 #20/0 ? ?Future visit scheduled: yes ? ?Notes to clinic:  Unable to refill per protocol, cannot delegate. ? ? ?  ?Requested Prescriptions  ?Pending Prescriptions Disp Refills  ? triamcinolone ointment (KENALOG) 0.5 % [Pharmacy Med Name: TRIAMCINOLONE 0.5% OINTMENT] 30 g 0  ?  Sig: APPLY TO AFFECTED AREA TWICE A DAY  ?  ? Not Delegated - Dermatology:  Corticosteroids Failed - 03/27/2022  5:29 PM  ?  ?  Failed - This refill cannot be delegated  ?  ?  Passed - Valid encounter within last 12 months  ?  Recent Outpatient Visits   ? ?      ? 1 month ago Type 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, without long-term current use of insulin ([place])  ? [facility] Jerrol Banana., MD  ? 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 [facility] Thedore Mins, Ria Comment, PA-C  ? 6 months ago Syncope, unspecified syncope type  ? [facility] Jerrol Banana., MD  ? 7 months ago Type 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, without long-term current use of insulin ([place])  ? [facility] Jerrol Banana., MD  ? 8 months ago Stomatitis and mucositis  ? [facility] Tally Joe T, FNP  ? ?  ?  ?Future Appointments   ? ?        ? In 2 months Jerrol Banana., MD [facility], [facility]  ? ?  ? ?  ?  ?  ? ondansetron (ZOFRAN) 4 MG tablet [Pharmacy Med Name: ONDANSETRON HCL 4 MG TABLET] 20 tablet 0  ?  Sig: TAKE 1 TABLET BY MOUTH EVERY 8 HOURS AS NEEDED  ?  ? Not Delegated - Gastroenterology: Antiemetics - ondansetron Failed - 03/27/2022  5:29 PM  ?  ?  Failed - This refill cannot be delegated  ?  ?  Passed - AST in normal range and  within 360 days  ?  AST  ?Date Value Ref Range Status  ?09/05/2021 17 15 - 41 U/L Final  ? ?SGOT(AST)  ?Date Value Ref Range Status  ?01/17/2014 20 15 - 37 Unit/L Final  ?  ?  ?  ?  Passed - ALT in normal range and within 360 days  ?  ALT  ?Date Value Ref Range Status  ?09/05/2021 7 0 - 44 U/L Final  ? ?SGPT (ALT)  ?Date Value Ref Range Status  ?01/17/2014 21 12 - 78 U/L Final  ?  ?  ?  ?  Passed - Valid encounter within last 6 months  ?  Recent Outpatient Visits   ? ?      ? 1 month ago Type 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, without long-term current use of insulin ([place])  ? [facility] Jerrol Banana., MD  ? 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 [facility] Thedore Mins, Ria Comment, PA-C  ? 6 months ago Syncope, unspecified syncope type  ? [facility] Jerrol Banana., MD  ? 7 months ago Type 2 diabetes mellitus with stage 3b chronic  kidney disease, without long-term current use of insulin ([place])  ? [facility] Jerrol Banana., MD  ? 8 months ago Stomatitis and mucositis  ? [facility] Tally Joe T, FNP  ? ?  ?  ?Future Appointments   ? ?        ? In 2 months Jerrol Banana., MD [facility], [facility]  ? ?  ? ?  ?  ?  ?Signed Prescriptions Disp Refills  ? LINZESS 290 MCG CAPS capsule 90 capsule 0  ?  Sig: TAKE 1 CAPSULE BY MOUTH EVERY DAY BEFORE BREAKFAST  ?  ? Gastroenterology: Irritable Bowel Syndrome Passed - 03/27/2022  5:29 PM  ?  ?  Passed - Valid encounter within last 12 months  ?  Recent Outpatient Visits   ? ?      ? 1 month ago Type 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, without long-term current use of insulin ([place])  ? [facility] Jerrol Banana., MD  ? 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 [facility] Thedore Mins, Ria Comment, PA-C  ? 6 months ago Syncope, unspecified syncope type  ? [facility] Jerrol Banana., MD  ? 7 months ago Type 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, without long-term current use of insulin ([place])  ? [facility] Jerrol Banana., MD  ? 8 months ago Stomatitis and mucositis  ? [facility] Tally Joe T, FNP  ? ?  ?  ?Future Appointments   ? ?        ? In 2 months Jerrol Banana., MD [facility], [facility]  ? ?  ? ?  ?  ?  ? ?

## 2022-03-28 NOTE — Patient Instructions (Addendum)
Ms. Hogenson , ?Thank you for taking time to come for your Medicare Wellness Visit. I appreciate your ongoing commitment to your health goals. Please review the following plan we discussed and let me know if I can assist you in the future.  ? ?Screening recommendations/referrals: ?Colonoscopy: 09/05/18 ?Mammogram: 12/20/21 ?Bone Density: 03/10/20 ?Recommended yearly ophthalmology/optometry visit for glaucoma screening and checkup ?Recommended yearly dental visit for hygiene and checkup ? ?Vaccinations: ?Influenza vaccine: 09/13/21 ?Pneumococcal vaccine: 06/24/21 ?Tdap vaccine: 09/27/16 ?Shingles vaccine: Zostavax 09/06/16  Shingrix 08/29/18, 08/15/19   ?Covid-19:01/29/20, 02/23/20, 10/11/20 ? ?Advanced directives: no ? ?Conditions/risks identified: none ? ?Next appointment: Follow up in one year for your annual wellness visit - 04/02/23 @ 8:15am by phone ? ? ?Preventive Care 24 Years and Older, Female ?Preventive care refers to lifestyle choices and visits with your health care provider that can promote health and wellness. ?What does preventive care include? ?A yearly physical exam. This is also called an annual well check. ?Dental exams once or twice a year. ?Routine eye exams. Ask your health care provider how often you should have your eyes checked. ?Personal lifestyle choices, including: ?Daily care of your teeth and gums. ?Regular physical activity. ?Eating a healthy diet. ?Avoiding tobacco and drug use. ?Limiting alcohol use. ?Practicing safe sex. ?Taking low-dose aspirin every day. ?Taking vitamin and mineral supplements as recommended by your health care provider. ?What happens during an annual well check? ?The services and screenings done by your health care provider during your annual well check will depend on your age, overall health, lifestyle risk factors, and family history of disease. ?Counseling  ?Your health care provider may ask you questions about your: ?Alcohol use. ?Tobacco use. ?Drug use. ?Emotional  well-being. ?Home and relationship well-being. ?Sexual activity. ?Eating habits. ?History of falls. ?Memory and ability to understand (cognition). ?Work and work Statistician. ?Reproductive health. ?Screening  ?You may have the following tests or measurements: ?Height, weight, and BMI. ?Blood pressure. ?Lipid and cholesterol levels. These may be checked every 5 years, or more frequently if you are over 68 years old. ?Skin check. ?Lung cancer screening. You may have this screening every year starting at age 64 if you have a 30-pack-year history of smoking and currently smoke or have quit within the past 15 years. ?Fecal occult blood test (FOBT) of the stool. You may have this test every year starting at age 58. ?Flexible sigmoidoscopy or colonoscopy. You may have a sigmoidoscopy every 5 years or a colonoscopy every 10 years starting at age 51. ?Hepatitis C blood test. ?Hepatitis B blood test. ?Sexually transmitted disease (STD) testing. ?Diabetes screening. This is done by checking your blood sugar (glucose) after you have not eaten for a while (fasting). You may have this done every 1-3 years. ?Bone density scan. This is done to screen for osteoporosis. You may have this done starting at age 65. ?Mammogram. This may be done every 1-2 years. Talk to your health care provider about how often you should have regular mammograms. ?Talk with your health care provider about your test results, treatment options, and if necessary, the need for more tests. ?Vaccines  ?Your health care provider may recommend certain vaccines, such as: ?Influenza vaccine. This is recommended every year. ?Tetanus, diphtheria, and acellular pertussis (Tdap, Td) vaccine. You may need a Td booster every 10 years. ?Zoster vaccine. You may need this after age 57. ?Pneumococcal 13-valent conjugate (PCV13) vaccine. One dose is recommended after age 61. ?Pneumococcal polysaccharide (PPSV23) vaccine. One dose is recommended after age 71. ?  Talk to your  health care provider about which screenings and vaccines you need and how often you need them. ?This information is not intended to replace advice given to you by your health care provider. Make sure you discuss any questions you have with your health care provider. ?Document Released: 12/30/2015 Document Revised: 08/22/2016 Document Reviewed: 10/04/2015 ?[employer] Interactive Patient Education ? 2017 [employer]. ? ?Fall Prevention in the Home ?Falls can cause injuries. They can happen to people of all ages. There are many things you can do to make your home safe and to help prevent falls. ?What can I do on the outside of my home? ?Regularly fix the edges of walkways and driveways and fix any cracks. ?Remove anything that might make you trip as you walk through a door, such as a raised step or threshold. ?Trim any bushes or trees on the path to your home. ?Use bright outdoor lighting. ?Clear any walking paths of anything that might make someone trip, such as rocks or tools. ?Regularly check to see if handrails are loose or broken. Make sure that both sides of any steps have handrails. ?Any raised decks and porches should have guardrails on the edges. ?Have any leaves, snow, or ice cleared regularly. ?Use sand or salt on walking paths during winter. ?Clean up any spills in your garage right away. This includes oil or grease spills. ?What can I do in the bathroom? ?Use night lights. ?Install grab bars by the toilet and in the tub and shower. Do not use towel bars as grab bars. ?Use non-skid mats or decals in the tub or shower. ?If you need to sit down in the shower, use a plastic, non-slip stool. ?Keep the floor dry. Clean up any water that spills on the floor as soon as it happens. ?Remove soap buildup in the tub or shower regularly. ?Attach bath mats securely with double-sided non-slip rug tape. ?Do not have throw rugs and other things on the floor that can make you trip. ?What can I do in the bedroom? ?Use night  lights. ?Make sure that you have a light by your bed that is easy to reach. ?Do not use any sheets or blankets that are too big for your bed. They should not hang down onto the floor. ?Have a firm chair that has side arms. You can use this for support while you get dressed. ?Do not have throw rugs and other things on the floor that can make you trip. ?What can I do in the kitchen? ?Clean up any spills right away. ?Avoid walking on wet floors. ?Keep items that you use a lot in easy-to-reach places. ?If you need to reach something above you, use a strong step stool that has a grab bar. ?Keep electrical cords out of the way. ?Do not use floor polish or wax that makes floors slippery. If you must use wax, use non-skid floor wax. ?Do not have throw rugs and other things on the floor that can make you trip. ?What can I do with my stairs? ?Do not leave any items on the stairs. ?Make sure that there are handrails on both sides of the stairs and use them. Fix handrails that are broken or loose. Make sure that handrails are as long as the stairways. ?Check any carpeting to make sure that it is firmly attached to the stairs. Fix any carpet that is loose or worn. ?Avoid having throw rugs at the top or bottom of the stairs. If you do have throw  rugs, attach them to the floor with carpet tape. ?Make sure that you have a light switch at the top of the stairs and the bottom of the stairs. If you do not have them, ask someone to add them for you. ?What else can I do to help prevent falls? ?Wear shoes that: ?Do not have high heels. ?Have rubber bottoms. ?Are comfortable and fit you well. ?Are closed at the toe. Do not wear sandals. ?If you use a stepladder: ?Make sure that it is fully opened. Do not climb a closed stepladder. ?Make sure that both sides of the stepladder are locked into place. ?Ask someone to hold it for you, if possible. ?Clearly mark and make sure that you can see: ?Any grab bars or handrails. ?First and last  steps. ?Where the edge of each step is. ?Use tools that help you move around (mobility aids) if they are needed. These include: ?Canes. ?Walkers. ?Scooters. ?Crutches. ?Turn on the lights when you go into a dark area. R

## 2022-03-28 NOTE — Progress Notes (Signed)
Virtual Visit via Telephone Note  I connected with  Julie Acevedo on 03/28/22 at  8:30 AM EDT by telephone and verified that I am speaking with the correct person using two identifiers.  Location: Patient: home Provider: [facility] Persons participating in the virtual visit: Nora   I discussed the limitations, risks, security and privacy concerns of performing an evaluation and management service by telephone and the availability of in person appointments. The patient expressed understanding and agreed to proceed.  Interactive audio and video telecommunications were attempted between this nurse and patient, however failed, due to patient having technical difficulties OR patient did not have access to video capability.  We continued and completed visit with audio only.  Some vital signs may be absent or patient reported.   Dionisio David, LPN  Subjective:   Julie Acevedo is a 71 y.o. female who presents for Medicare Annual (Subsequent) preventive examination.  Review of Systems           Objective:    There were no vitals filed for this visit. There is no height or weight on file to calculate BMI.     12/15/2021    8:22 AM 11/29/2021    8:51 AM 09/05/2021   12:40 PM 06/19/2021    8:52 PM 04/18/2021    7:20 AM 04/04/2021    3:57 PM 03/10/2021    5:40 AM  Advanced Directives  Does Patient Have a Medical Advance Directive? No No No No No No No  Would patient like information on creating a medical advance directive?  Yes (MAU/Ambulatory/Procedural Areas - Information given)   No - Patient declined      Current Medications (verified) Outpatient Encounter Medications as of 03/28/2022  Medication Sig   aspirin EC 81 MG EC tablet Take 1 tablet (81 mg total) by mouth daily. Swallow whole.   atorvastatin (LIPITOR) 40 MG tablet Take 1 tablet (40 mg total) by mouth daily.   calcitRIOL (ROCALTROL) 0.25 MCG capsule Take 0.25 mcg by mouth daily.   cyclobenzaprine  (FLEXERIL) 5 MG tablet TAKE 1 TABLET BY MOUTH THREE TIMES A DAY AS NEEDED   diclofenac Sodium (VOLTAREN) 1 % GEL    gabapentin (NEURONTIN) 300 MG capsule Take 1 capsule (300 mg total) by mouth 3 (three) times daily.   hydrOXYzine (VISTARIL) 100 MG capsule Take 1 capsule (100 mg total) by mouth 3 (three) times daily.   LINZESS 290 MCG CAPS capsule TAKE 1 CAPSULE BY MOUTH EVERY DAY BEFORE BREAKFAST   metFORMIN (GLUCOPHAGE) 500 MG tablet TAKE 1 TABLET (500 MG TOTAL) BY MOUTH 2 (TWO) TIMES DAILY WITH A MEAL. (Patient taking differently: Take 500 mg by mouth daily with breakfast.)   metoprolol succinate (TOPROL-XL) 25 MG 24 hr tablet TAKE 1 TABLET (25 MG TOTAL) BY MOUTH DAILY.   omeprazole (PRILOSEC) 20 MG capsule TAKE 1 CAPSULE BY MOUTH EVERY DAY   ondansetron (ZOFRAN) 4 MG tablet TAKE 1 TABLET BY MOUTH EVERY 8 HOURS AS NEEDED   oxyCODONE-acetaminophen (PERCOCET) 5-325 MG tablet Take 1 tablet by mouth every 4 (four) hours as needed for severe pain.   rivastigmine (EXELON) 3 MG capsule Take 3 mg by mouth 2 (two) times daily.   traMADol (ULTRAM) 50 MG tablet Take 1 tablet (50 mg total) by mouth every 6 (six) hours as needed.   traZODone (DESYREL) 50 MG tablet TAKE 1 TABLET (50 MG TOTAL) BY MOUTH AT BEDTIME AS NEEDED.   triamcinolone ointment (KENALOG) 0.5 % APPLY TO  AFFECTED AREA TWICE A DAY   No facility-administered encounter medications on file as of 03/28/2022.    Allergies (verified) Lotrel [amlodipine besy-benazepril hcl], Contrast media [iodinated contrast media], Niacin, and Sulfa antibiotics   History: Past Medical History:  Diagnosis Date   Anemia    Arthritis    COVID-19 11/19/2019   Diverticulitis    DM (diabetes mellitus) ([place])    typ e 2   History of methicillin resistant staphylococcus aureus (MRSA) 2017   HTN (hypertension)    Hyperlipidemia    Memory difficulties 09/01/2015   Multiple thyroid nodules    Myocardial infarction ([place]) 1995   Short-term memory loss     Thyroid nodule    Past Surgical History:  Procedure Laterality Date   ABDOMINAL HYSTERECTOMY     due to endometriosis-1 ovary left   BREAST EXCISIONAL BIOPSY Right 1971   neg   CARDIAC CATHETERIZATION  2000   no significant CAD per note of Dr Caryl Comes in 2013    [place] N/A 03/19/2017   Procedure: ANTERIOR REPAIR (CYSTOCELE);  Surgeon: Bjorn Loser, MD;  Location: [facility];  Service: Urology;  Laterality: N/A;   CYSTOSCOPY N/A 03/19/2017   Procedure: CYSTOSCOPY;  Surgeon: Bjorn Loser, MD;  Location: [facility];  Service: Urology;  Laterality: N/A;   ESOPHAGOGASTRODUODENOSCOPY (EGD) WITH PROPOFOL N/A 12/15/2021   Procedure: ESOPHAGOGASTRODUODENOSCOPY (EGD) WITH PROPOFOL;  Surgeon: Lesly Rubenstein, MD;  Location: [facility];  Service: Endoscopy;  Laterality: N/A;   EYE SURGERY     FOOT SURGERY     HAND SURGERY     HEMICOLECTOMY     INSERTION OF MESH N/A 08/21/2016   Procedure: INSERTION OF MESH;  Surgeon: Excell Seltzer, MD;  [facility]: [facility];  Service: General;  Laterality: N/A;   INSERTION OF MESH N/A 12/08/2019   Procedure: INSERTION OF MESH;  Surgeon: Jules Husbands, MD;  Location: [facility];  Service: General;  Laterality: N/A;   LAPAROSCOPIC LYSIS OF ADHESIONS N/A 08/21/2016   Procedure: LAPAROSCOPIC LYSIS OF ADHESIONS;  Surgeon: Excell Seltzer, MD;  [facility];  Service: General;  Laterality: N/A;   LAPAROSCOPIC REMOVAL ABDOMINAL MASS     LAPAROTOMY N/A 04/16/2019   Procedure: EXPLORATORY LAPAROTOMY;  Surgeon: Benjamine Sprague, DO;  Location: [facility];  Service: General;  Laterality: N/A;   LOOP RECORDER INSERTION N/A 03/21/2018   Procedure: LOOP RECORDER INSERTION;  Surgeon: Sanda Klein, MD;  Location: [place] [facility];  Service: Cardiovascular;  Laterality: N/A;   LUMBAR DISC SURGERY     ROUX-EN-Y GASTRIC BYPASS  2010   Dr Hassell Done   SMALL INTESTINE SURGERY     TONSILLECTOMY     UPPER GI ENDOSCOPY  01/12/2016   normal  larynx, normal esophagus. gastric bypass with a normal-sized pouch and intact staple line, normal examined jejunum, otherwise exam was normal   VENTRAL HERNIA REPAIR N/A 08/21/2016   Procedure: LAPAROSCOPIC VENTRAL HERNIA;  Surgeon: Excell Seltzer, MD;  [facility];  Service: General;  Laterality: N/A;   XI ROBOTIC ASSISTED VENTRAL HERNIA N/A 12/08/2019   Procedure: XI ROBOTIC ASSISTED VENTRAL HERNIA;  Surgeon: Jules Husbands, MD;  Location: [facility];  Service: General;  Laterality: N/A;   Family History  Problem Relation Age of Onset   Cancer Father 61       Lung   Coronary artery disease Father 38   COPD Mother    Diabetes Mother    Hypertension Mother    Cancer Paternal  Grandmother        Breast   Breast cancer Paternal Grandmother    Congestive Heart Failure Maternal Grandmother    Emphysema Maternal Grandfather    Heart attack Paternal Grandfather    Cancer Paternal Aunt        Breast   Breast cancer Paternal Aunt 39   Breast cancer Paternal Aunt 82   Social History   Socioeconomic History   Marital status: Married    Spouse name: Not on file   Number of children: 3   Years of education: 14   Highest education level: Some college, no degree  Occupational History   Occupation: IT consultant: RETIRED  Tobacco Use   Smoking status: Never   Smokeless tobacco: Never  Vaping Use   Vaping Use: Never used  Substance and Sexual Activity   Alcohol use: No   Drug use: No   Sexual activity: Never  Other Topics Concern   Not on file  Social History Narrative   Lives with husband, daughter and granddaughter.;   Patient drinks about 3 cups of caffeine daily.   Patient is left handed.    Social Determinants of Health   Financial Resource Strain: Low Risk    Difficulty of Paying Living Expenses: Not hard at all  Food Insecurity: No Food Insecurity   Worried About Charity fundraiser in the Last Year: Never true   [place] in the Last Year: Never  true  Transportation Needs: No Transportation Needs   Lack of Transportation (Medical): No   Lack of Transportation (Non-Medical): No  Physical Activity: Not on file  Stress: Not on file  Social Connections: Not on file    Tobacco Counseling Counseling given: Not Answered   Clinical Intake:  Pre-visit preparation completed: Yes  Pain : No/denies pain     Nutritional Risks: None Diabetes: Yes CBG done?: No Did pt. bring in CBG monitor from home?: No  How often do you need to have someone help you when you read instructions, pamphlets, or other written materials from your doctor or pharmacy?: 1 - Never  Diabetic?yes Nutrition Risk Assessment:  Has the patient had any N/V/D within the last 2 months?  Yes  Does the patient have any non-healing wounds?  No  Has the patient had any unintentional weight loss or weight gain?  No   Diabetes:  Is the patient diabetic?  Yes  If diabetic, was a CBG obtained today?  No  Did the patient bring in their glucometer from home?  No  How often do you monitor your CBG's? Every morning.   Financial Strains and Diabetes Management:  Are you having any financial strains with the device, your supplies or your medication? No .  Does the patient want to be seen by Chronic Care Management for management of their diabetes?  No  Would the patient like to be referred to a Nutritionist or for Diabetic Management?  No   Diabetic Exams:  Diabetic Eye Exam: Completed 06/13/20. Overdue for diabetic eye exam. Pt has been advised about the importance in completing this exam.   Diabetic Foot Exam: Completed 01/19/21. Pt has been advised about the importance in completing this exam.   Interpreter Needed?: No  Information entered by :: Kirke Shaggy, LPN   Activities of Daily Living    11/22/2021    3:48 PM 09/06/2021    3:00 PM  In your present state of health, do you have any  difficulty performing the following activities:  Hearing? 0 0   Vision? 0 0  Difficulty concentrating or making decisions? 0 1  Walking or climbing stairs? 1 0  Dressing or bathing? 0 0  Doing errands, shopping? 0 0    Patient Care Team: Jerrol Banana., MD as PCP - General (Family Medicine) Yolonda Kida, MD as PCP - Cardiology (Cardiology) Lorelee Cover., MD as Consulting Physician (Ophthalmology) Bjorn Loser, MD as Consulting Physician (Urology) Bary Castilla, Forest Gleason, MD as Consulting Physician (General Surgery) Jules Husbands, MD as Consulting Physician (General Surgery) Gardiner Barefoot, DPM as Consulting Physician (Podiatry) Gae Dry, MD as Referring Physician (Obstetrics and Gynecology) Vladimir Crofts, MD as Consulting Physician (Neurology) Randon Goldsmith, OT as Occupational Therapist (Occupational Therapy)  Indicate any recent Medical Services you may have received from other than [facility] providers in the past year (date may be approximate).     Assessment:   This is a routine wellness examination for [place].  Hearing/Vision screen No results found.  Dietary issues and exercise activities discussed:     Goals Addressed   None    Depression Screen    12/26/2021   11:13 AM 12/12/2021   11:36 AM 11/22/2021    3:48 PM 06/01/2021   10:12 AM 05/29/2021    1:14 PM 04/26/2021   10:05 AM 02/08/2021   11:30 AM  PHQ 2/9 Scores  PHQ - 2 Score 3 0 0 '1 2 1 '$ 0  PHQ- 9 Score '6  8  16      '$ Fall Risk    12/12/2021   11:33 AM 11/22/2021    3:48 PM 02/08/2021   11:33 AM 12/15/2020   11:24 AM 11/30/2020    3:44 PM  Fall Risk   Falls in the past year? '1 1 1 1 1  '$ Number falls in past yr: '1 1 1 1 '$ 0  Comment  6     Injury with Fall? 1 1 0 0 0  Risk for fall due to : History of fall(s);Impaired balance/gait;Medication side effect History of fall(s) No Fall Risks  History of fall(s)  Follow up Falls prevention discussed Falls evaluation completed Falls prevention discussed  Falls evaluation completed;Education  provided;Falls prevention discussed    FALL RISK PREVENTION PERTAINING TO THE HOME:  Any stairs in or around the home? Yes  If so, are there any without handrails? No  Home free of loose throw rugs in walkways, pet beds, electrical cords, etc? Yes  Adequate lighting in your home to reduce risk of falls? Yes   ASSISTIVE DEVICES UTILIZED TO PREVENT FALLS:  Life alert? No  Use of a cane, walker or w/c? Yes  Grab bars in the bathroom? Yes  Shower chair or bench in shower? Yes  Elevated toilet seat or a handicapped toilet? Yes   Cognitive Function:    11/30/2020    3:46 PM 09/01/2015    9:49 AM  MMSE - Mini Mental State Exam  Orientation to time 4 4  Orientation to Place 5 4  Registration 3 2  Attention/ Calculation 5 5  Recall 2 3  Language- name 2 objects 2 2  Language- repeat 1 0  Language- follow 3 step command 3 2  Language- read & follow direction 1 1  Write a sentence 1 1  Copy design 1 1  Total score 28 25        11/30/2020    3:36 PM 02/08/2020    9:04 AM  12/24/2016   11:24 AM  6CIT Screen  What Year? 0 points 0 points 0 points  What month? 0 points 0 points 0 points  What time? 0 points 0 points 0 points  Count back from 20 0 points 0 points 0 points  Months in reverse 0 points 4 points 0 points  Repeat phrase 6 points 4 points 4 points  Total Score 6 points 8 points 4 points    Immunizations Immunization History  Administered Date(s) Administered   Fluad Quad(high Dose 65+) 08/15/2019, 09/01/2020, 09/13/2021   Influenza Split 10/17/2010, 10/02/2011, 08/27/2012   Influenza, High Dose Seasonal PF 08/29/2018   Influenza,inj,Quad PF,6+ Mos 09/26/2013, 09/06/2014, 09/06/2016   Influenza,inj,Quad PF,6-35 Mos 08/31/2017   Influenza-Unspecified 09/15/2015   PFIZER Comirnaty(Gray Top)Covid-19 Tri-Sucrose Vaccine 04/05/2021   PFIZER(Purple Top)SARS-COV-2 Vaccination 01/29/2020, 02/23/2020, 10/11/2020   PNEUMOCOCCAL CONJUGATE-20 06/24/2021   Pneumococcal  Conjugate-13 08/29/2018   Pneumococcal Polysaccharide-23 11/17/2012   Tdap 10/02/2011, 09/27/2016   Zoster Recombinat (Shingrix) 08/29/2018, 08/15/2019   Zoster, Live 09/06/2016    TDAP status: Up to date  Flu Vaccine status: Up to date  Pneumococcal vaccine status: Up to date  Covid-19 vaccine status: Completed vaccines  Qualifies for Shingles Vaccine? Yes   Zostavax completed Yes   Shingrix Completed?: Yes  Screening Tests Health Maintenance  Topic Date Due   COVID-19 Vaccine (5 - Booster for Pfizer series) 05/31/2021   OPHTHALMOLOGY EXAM  06/13/2021   URINE MICROALBUMIN  06/23/2021   FOOT EXAM  01/19/2022   INFLUENZA VACCINE  07/17/2022   HEMOGLOBIN A1C  08/13/2022   MAMMOGRAM  12/06/2023   DEXA SCAN  03/10/2025   TETANUS/TDAP  09/27/2026   COLONOSCOPY (Pts 45-37yr Insurance coverage will need to be confirmed)  09/05/2028   Pneumonia Vaccine 71 Years old  Completed   Hepatitis C Screening  Completed   Zoster Vaccines- Shingrix  Completed   HPV VACCINES  Aged Out    Health Maintenance  Health Maintenance Due  Topic Date Due   COVID-19 Vaccine (5 - [place]oster [place]) 05/31/2021   OPHTHALMOLOGY EXAM  06/13/2021   URINE MICROALBUMIN  06/23/2021   FOOT EXAM  01/19/2022    Colorectal cancer screening: Type of screening: Colonoscopy. Completed 09/05/18. Repeat every 10 years  Mammogram status: Completed 12/20/21. Repeat every year  Bone Density status: Completed 03/10/20. Results reflect: Bone density results: NORMAL. Repeat every 5 years.  Lung Cancer Screening: (Low Dose CT Chest recommended if Age 69-80years, 30 pack-year currently smoking OR have quit w/in 15years.) does not qualify.   Additional Screening:  Hepatitis C Screening: does qualify; Completed 05/05/12  Vision Screening: Recommended annual ophthalmology exams for early detection of glaucoma and other disorders of the eye. Is the patient up to date with their annual eye exam?  Yes  Who is  the provider or what is the name of the office in which the patient attends annual eye exams? Dr.Bell If pt is not established with a provider, would they like to be referred to a provider to establish care? No .   Dental Screening: Recommended annual dental exams for proper oral hygiene  Community Resource Referral / Chronic Care Management: CRR required this visit?  No   CCM required this visit?  No      Plan:     I have personally reviewed and noted the following in the patient's chart:   Medical and social history Use of alcohol, tobacco or illicit drugs  Current medications and supplements including opioid  prescriptions.  Functional ability and status Nutritional status Physical activity Advanced directives List of other physicians Hospitalizations, surgeries, and ER visits in previous 12 months Vitals Screenings to include cognitive, depression, and falls Referrals and appointments  In addition, I have reviewed and discussed with patient certain preventive protocols, quality metrics, and best practice recommendations. A written personalized care plan for preventive services as well as general preventive health recommendations were provided to patient.     Dionisio David, LPN   02/28/9457   Nurse Notes: none

## 2022-03-28 NOTE — Telephone Encounter (Signed)
For future refill ?Requested Prescriptions  ?Pending Prescriptions Disp Refills  ?? triamcinolone ointment (KENALOG) 0.5 % [Pharmacy Med Name: TRIAMCINOLONE 0.5% OINTMENT] 30 g 0  ?  Sig: APPLY TO AFFECTED AREA TWICE A DAY  ?  ? Not Delegated - Dermatology:  Corticosteroids Failed - 03/27/2022  5:29 PM  ?  ?  Failed - This refill cannot be delegated  ?  ?  Passed - Valid encounter within last 12 months  ?  Recent Outpatient Visits   ?      ? 1 month ago Type 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, without long-term current use of insulin ([place])  ? [facility] Jerrol Banana., MD  ? 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 [facility] Thedore Mins, Ria Comment, PA-C  ? 6 months ago Syncope, unspecified syncope type  ? [facility] Jerrol Banana., MD  ? 7 months ago Type 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, without long-term current use of insulin ([place])  ? [facility] Jerrol Banana., MD  ? 8 months ago Stomatitis and mucositis  ? [facility] Tally Joe T, FNP  ?  ?  ?Future Appointments   ?        ? In 2 months Jerrol Banana., MD [facility]  ?  ? ?  ?  ?  ?? LINZESS 290 MCG CAPS capsule [Pharmacy Med Name: LINZESS 290 MCG CAPSULE] 90 capsule 0  ?  Sig: TAKE 1 CAPSULE BY MOUTH EVERY DAY BEFORE BREAKFAST  ?  ? Gastroenterology: Irritable Bowel Syndrome Passed - 03/27/2022  5:29 PM  ?  ?  Passed - Valid encounter within last 12 months  ?  Recent Outpatient Visits   ?      ? 1 month ago Type 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, without long-term current use of insulin ([place])  ? [facility] Jerrol Banana., MD  ? 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  [facility] Thedore Mins, Ria Comment, PA-C  ? 6 months ago Syncope, unspecified syncope type  ? [facility] Jerrol Banana., MD  ? 7 months ago Type 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, without long-term current use of insulin ([place])  ? [facility] Jerrol Banana., MD  ? 8 months ago Stomatitis and mucositis  ? [facility] Tally Joe T, FNP  ?  ?  ?Future Appointments   ?        ? In 2 months Jerrol Banana., MD [facility]  ?  ? ?  ?  ?  ?? ondansetron (ZOFRAN) 4 MG tablet [Pharmacy Med Name: ONDANSETRON HCL 4 MG TABLET] 20 tablet 0  ?  Sig: TAKE 1 TABLET BY MOUTH EVERY 8 HOURS AS NEEDED  ?  ? Not Delegated - Gastroenterology: Antiemetics - ondansetron Failed - 03/27/2022  5:29 PM  ?  ?  Failed - This refill cannot be delegated  ?  ?  Passed - AST in normal range and within 360 days  ?  AST  ?Date Value Ref Range Status  ?09/05/2021 17 15 - 41 U/L Final  ? ?SGOT(AST)  ?Date Value Ref Range Status  ?01/17/2014 20  15 - 37 Unit/L Final  ?   ?  ?  Passed - ALT in normal range and within 360 days  ?  ALT  ?Date Value Ref Range Status  ?09/05/2021 7 0 - 44 U/L Final  ? ?SGPT (ALT)  ?Date Value Ref Range Status  ?01/17/2014 21 12 - 78 U/L Final  ?   ?  ?  Passed - Valid encounter within last 6 months  ?  Recent Outpatient Visits   ?      ? 1 month ago Type 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, without long-term current use of insulin ([place])  ? [facility] Jerrol Banana., MD  ? 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 [facility] Thedore Mins, Ria Comment, PA-C  ? 6 months ago Syncope, unspecified syncope type  ? [facility] Jerrol Banana., MD  ? 7 months ago Type 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, without long-term current use of  insulin ([place])  ? [facility] Jerrol Banana., MD  ? 8 months ago Stomatitis and mucositis  ? [facility] Tally Joe T, FNP  ?  ?  ?Future Appointments   ?        ? In 2 months Jerrol Banana., MD [facility], [facility]  ?  ? ?  ?  ?  ? ? ?

## 2022-04-05 IMAGING — CR DG CHEST 2V
2 series · 2 of 2 positions shown · non-contrast
Comparison: October 13, 2018

CLINICAL DATA: Chest pain.  Hypertension.

EXAM:
CHEST - 2 VIEW

[chest pa]
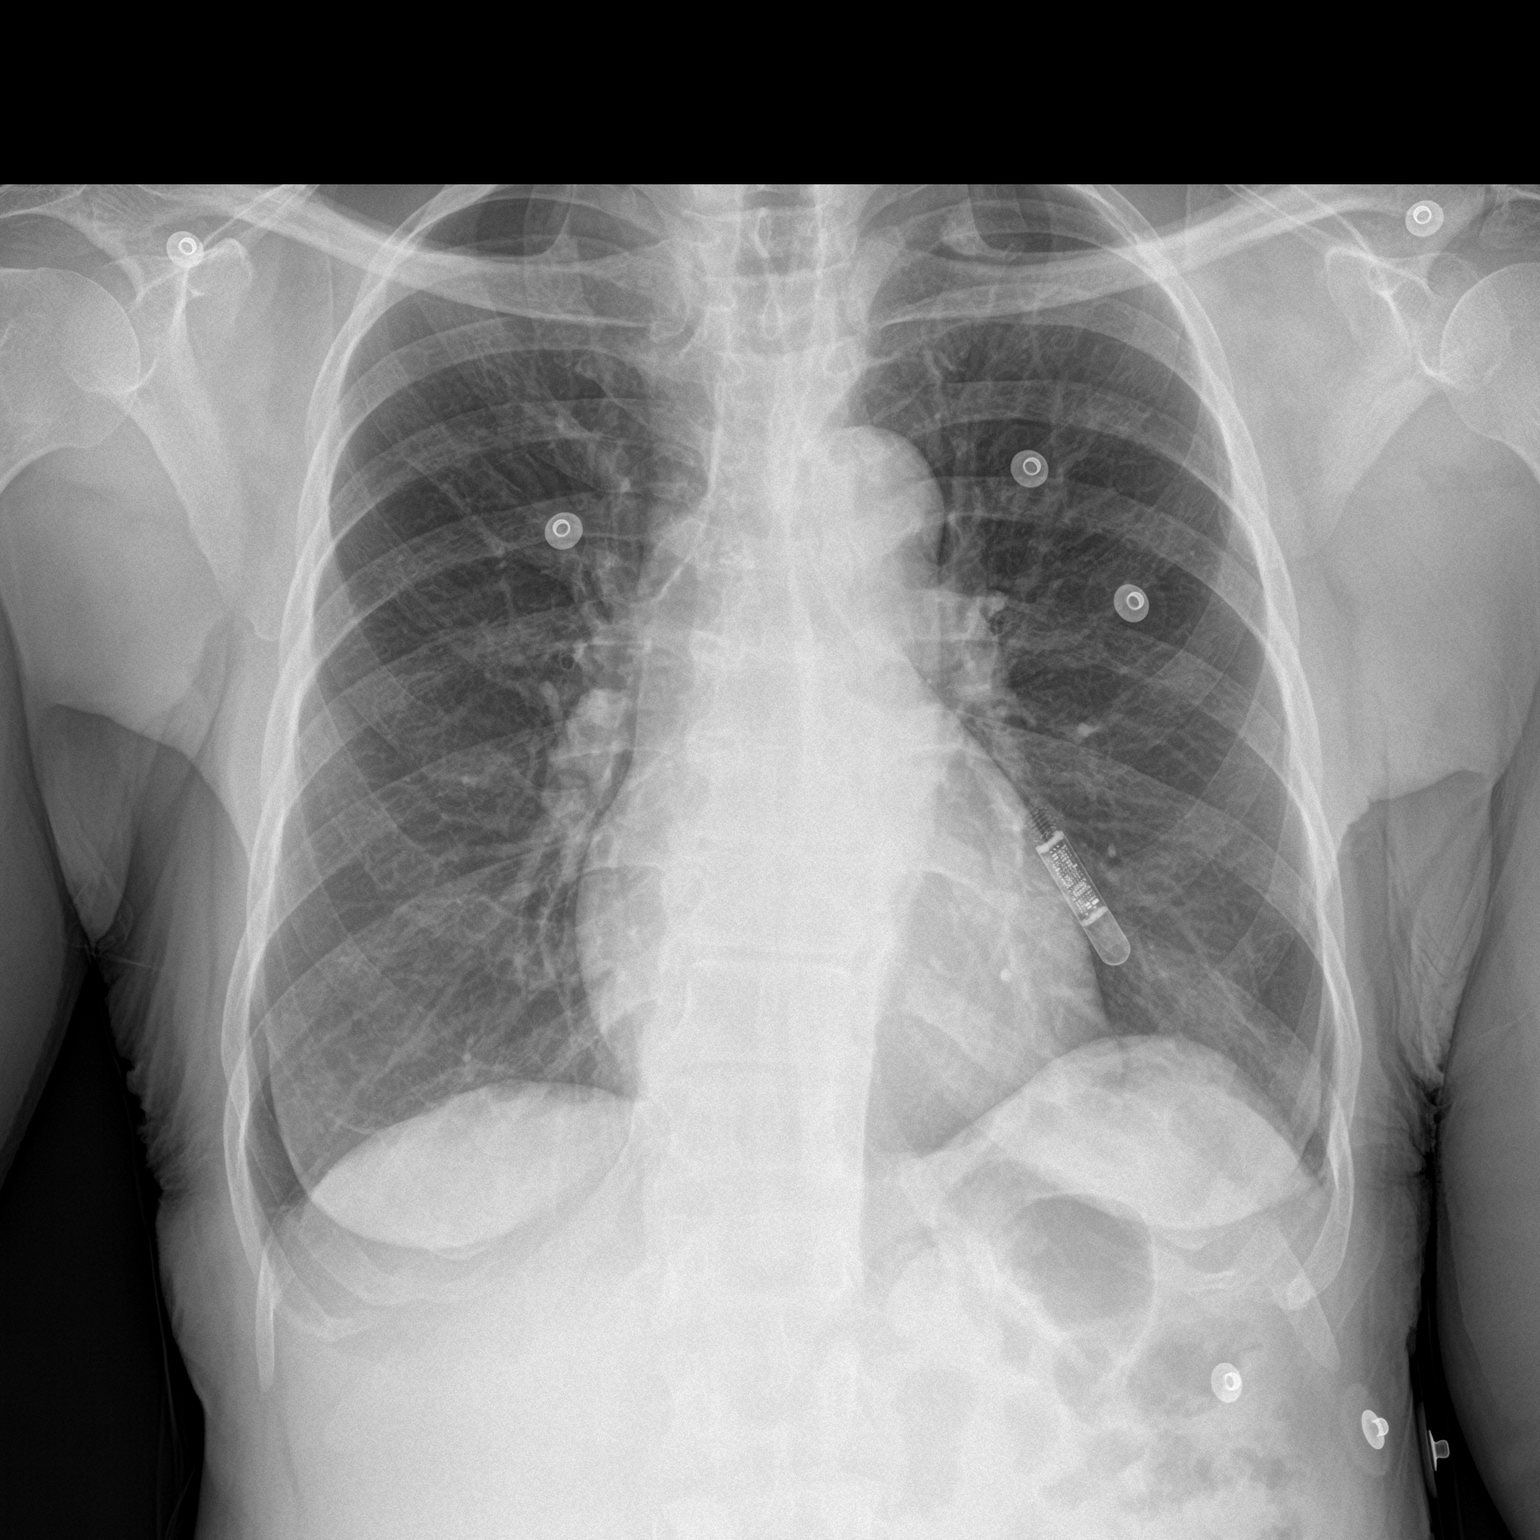

[chest lat]
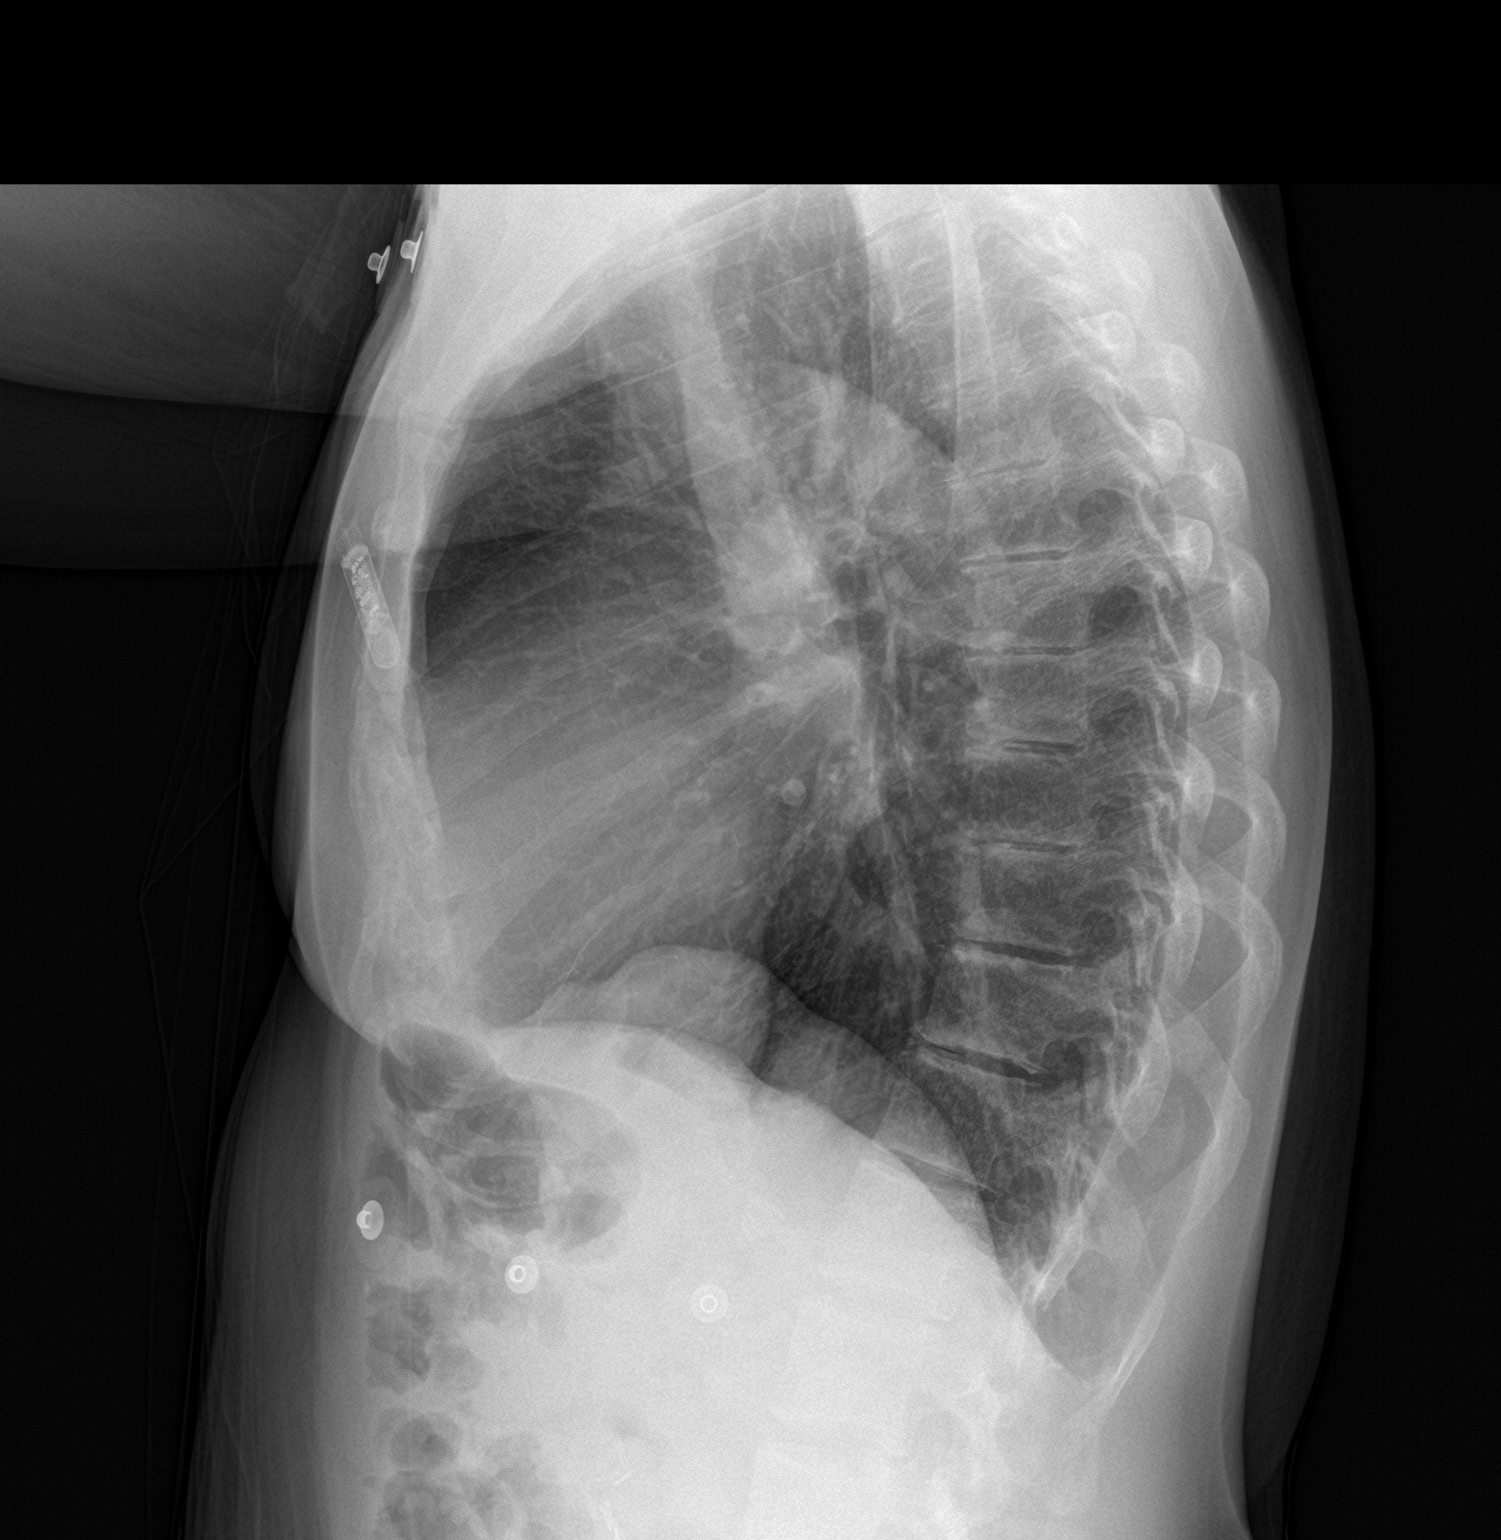

[2 of 2 positions shown; findings below may reference images not displayed]

FINDINGS: The lungs are clear. The heart size and pulmonary vascularity are
normal. No adenopathy. There is a loop recorder on the left
anteriorly. No pneumothorax. No bone lesions.
IMPRESSION: Lungs clear. Cardiac silhouette normal. Loop recorder on left
anteriorly.

## 2022-04-17 IMAGING — CT CT ABD-PELV W/ CM
2 of 5 series · 15 of 46 positions shown, 17 images · IV contrast (APPLIED)
Comparison: October 08, 2019

CLINICAL DATA: Abdominal pain with early satiety and increased
eructation

EXAM:
CT ABDOMEN AND PELVIS WITH CONTRAST
TECHNIQUE: Multidetector CT imaging of the abdomen and pelvis was performed
using the standard protocol following bolus administration of
intravenous contrast. Oral contrast was also administered.
CONTRAST:  75mL OMNIPAQUE IOHEXOL 300 MG/ML  SOLN

[Series 2: routine abd/pel with · axial · 0.66mm/px · z∈[-864,-484]mm · 12 of 86 slices shown, 14 images]
[im 5/86  soft-tissue]
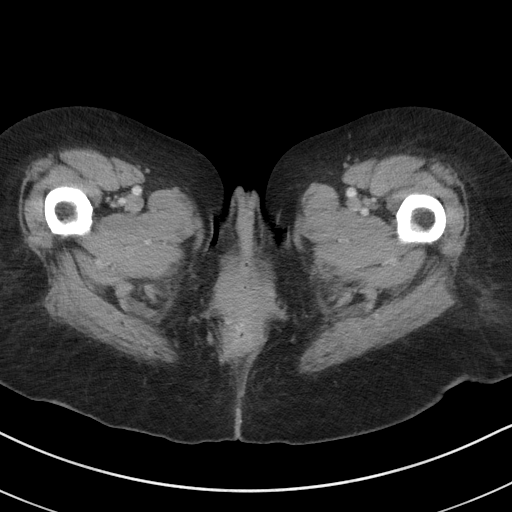
[im 5/86  bone]
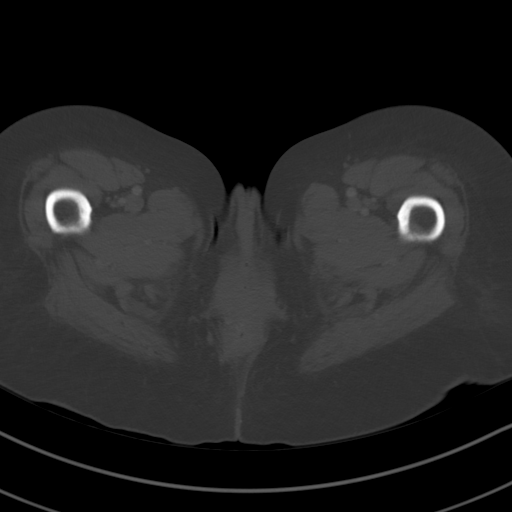
[im 15/86  soft-tissue]
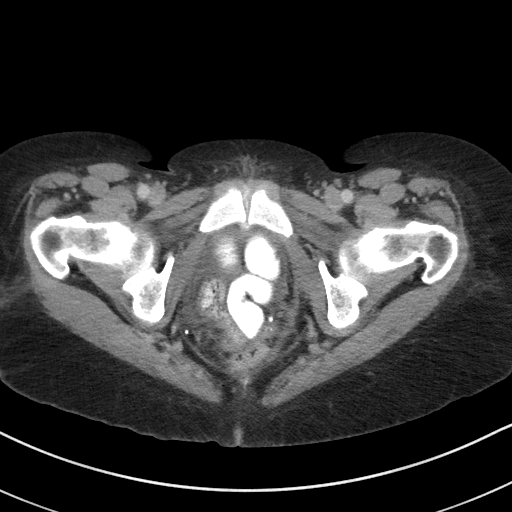
[im 19/86  soft-tissue]
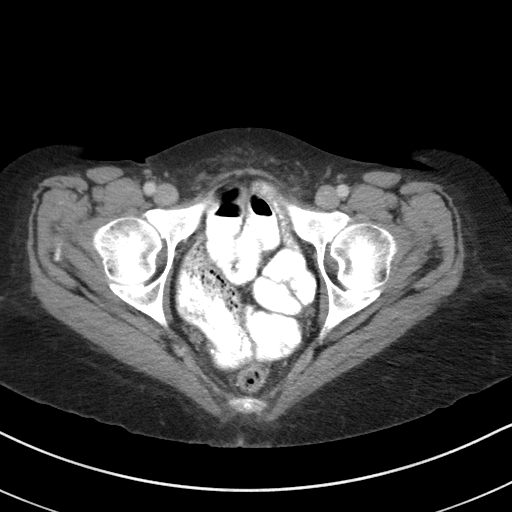
[im 24/86  soft-tissue]
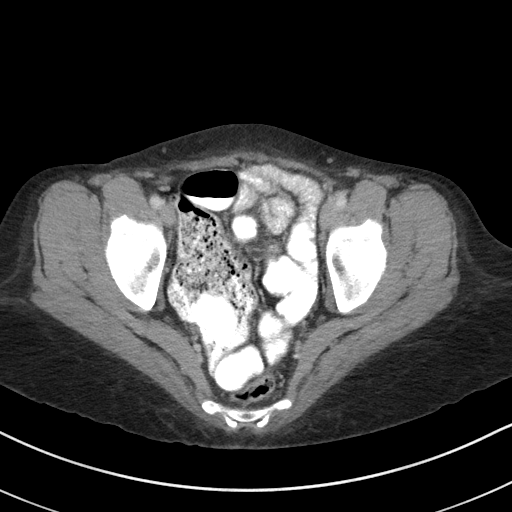
[im 34/86  soft-tissue]
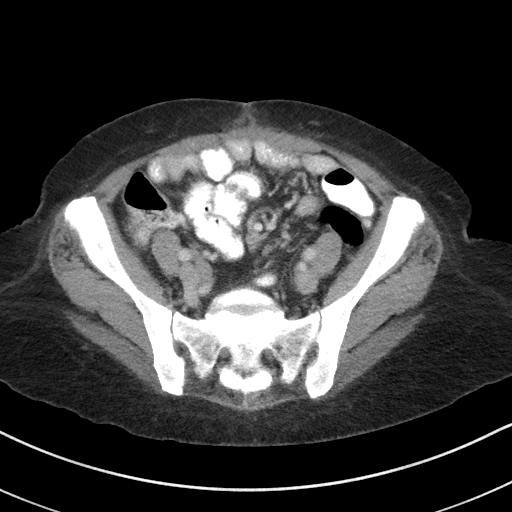
[im 38/86  soft-tissue]
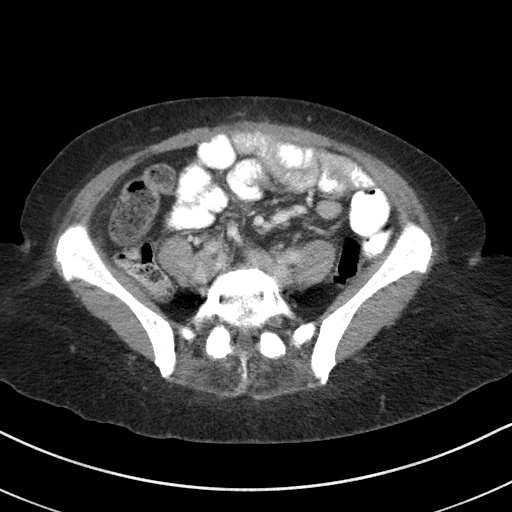
[im 48/86  soft-tissue]
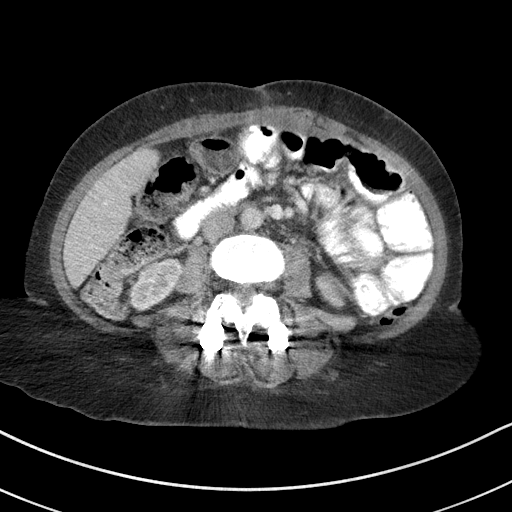
[im 52/86  soft-tissue]
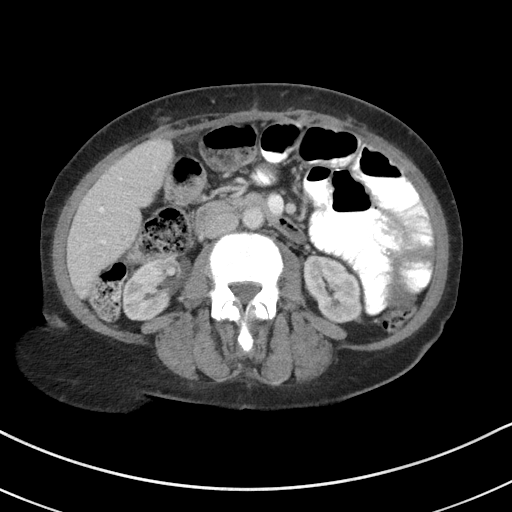
[im 62/86  soft-tissue]
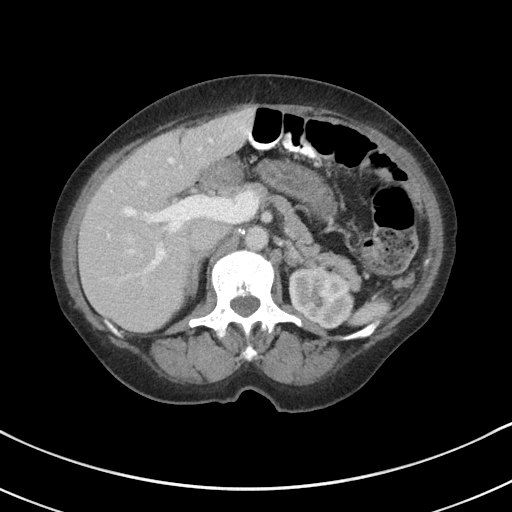
[im 62/86  bone]
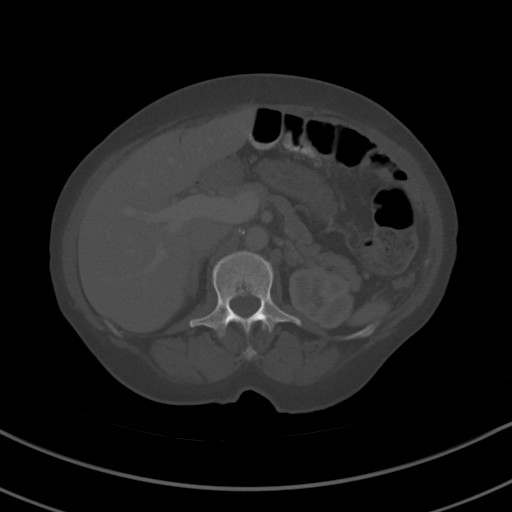
[im 67/86  soft-tissue]
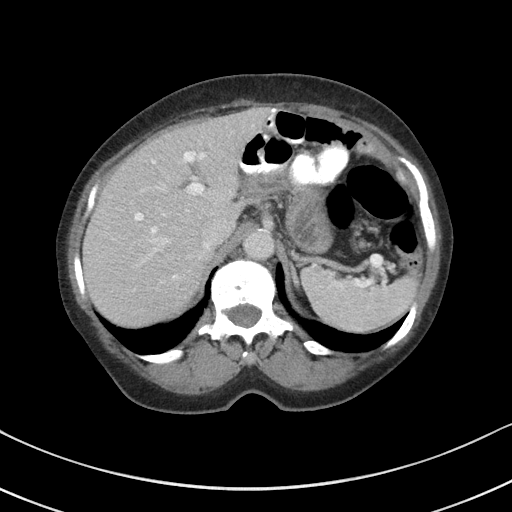
[im 71/86  soft-tissue]
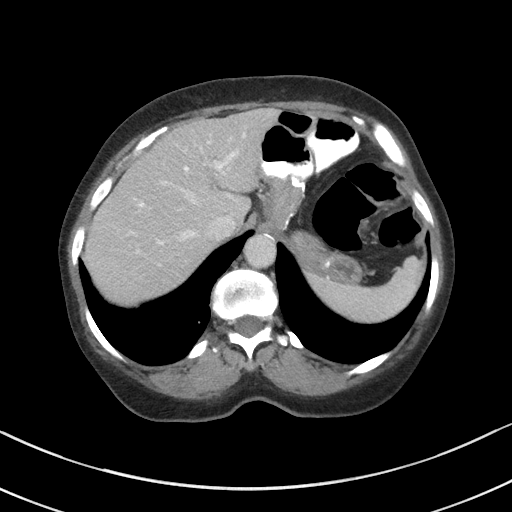
[im 81/86  soft-tissue]
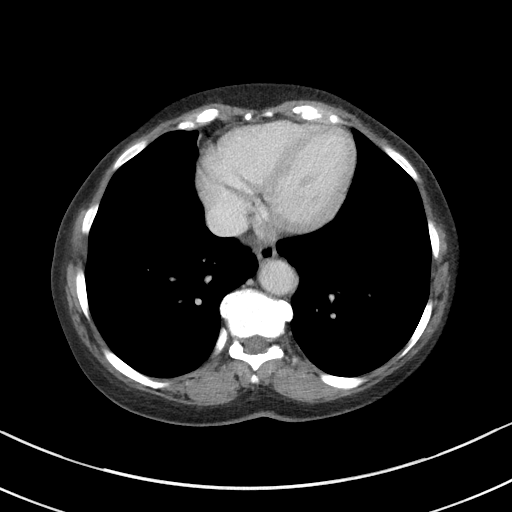

[Series 6: coronal st · coronal · 0.64mm/px · 3 of 85 slices shown]
[im 29/85  soft-tissue]
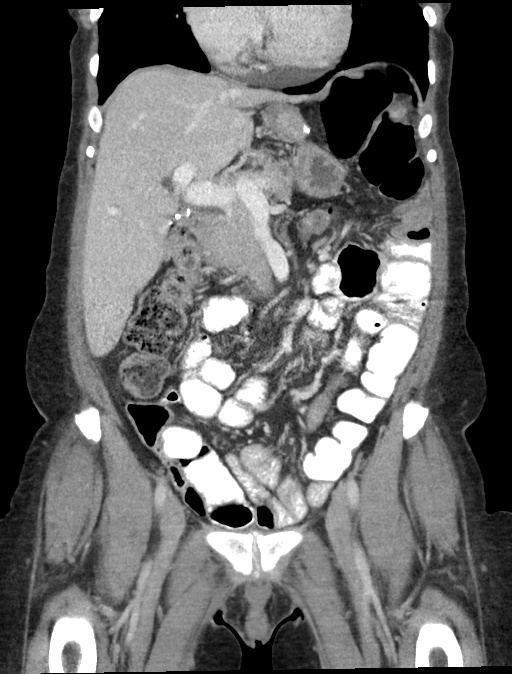
[im 38/85  soft-tissue]
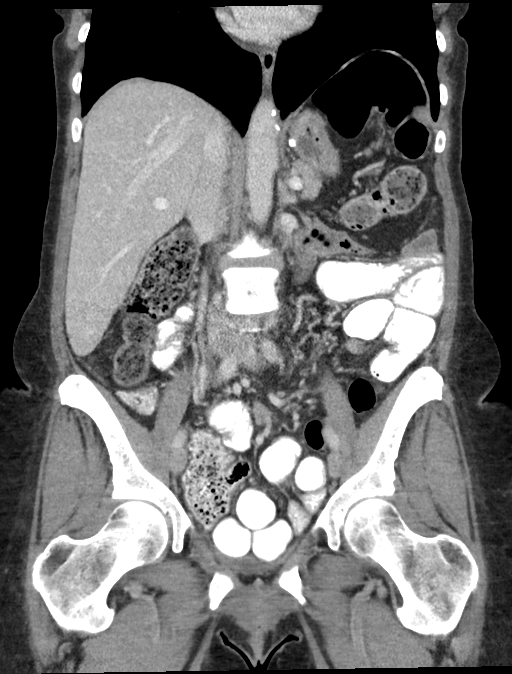
[im 47/85  soft-tissue]
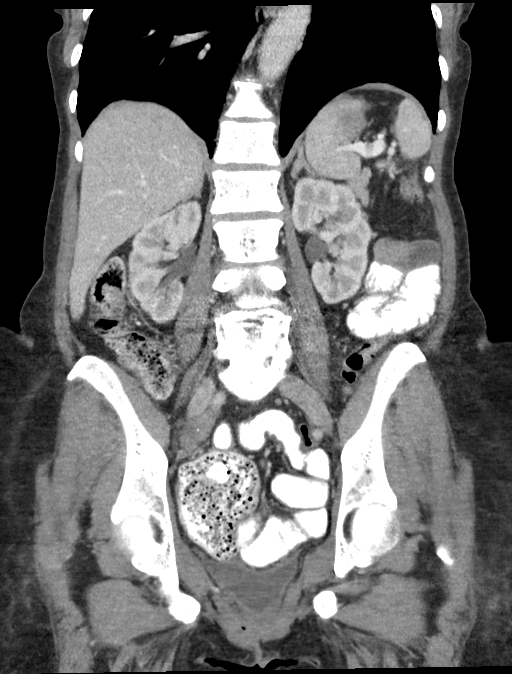

[15 of 46 positions shown; findings below may reference images not displayed]

FINDINGS: Lower chest: Lung bases are clear. There is mild elevation of the
left hemidiaphragm.

Hepatobiliary: No focal liver lesions are appreciable. Gallbladder
is absent. There is no appreciable biliary duct dilatation.

Pancreas: There is no pancreatic mass or inflammatory focus.

Spleen: No splenic lesions are evident.

Adrenals/Urinary Tract: Adrenals bilaterally appear unremarkable.
There is no appreciable renal mass or hydronephrosis. No evident
renal or ureteral calculus on either side. Urinary bladder is
midline with wall thickness within normal limits.

Stomach/Bowel: Patient is status post gastric bypass procedure.
There is no wall thickening or fluid in the postoperative regions in
the upper abdomen.

There is apparent prolapse of distal ileum into the cecum with
prominence of the ileocecal valve in this region. There is no
appreciable bowel obstruction associated with this apparent distal
ileal prolapse. Note that there are several loops of jejunum with
mild wall thickening which may be indicative of underlying
enteritis. There is no free air or portal venous air. Moderate stool
is seen in the colon.

Vascular/Lymphatic: No abdominal aortic aneurysm. There are foci of
aortic atherosclerosis. Major venous structures appear patent. There
is no evident adenopathy in the abdomen or pelvis.

Reproductive: Uterus is absent.  No pelvic mass evident.

Other: There has been repair of midline ventral hernia compared to
previous study. There is scarring in the anterior abdominal wall at
the site of hernia repair. No recurrent hernia in this area noted.

No appendiceal inflammation. No abscess or ascites evident in the
abdomen or pelvis.

Musculoskeletal: Postoperative changes noted at L4 and L5. No
blastic or lytic bone lesions. No intramuscular lesions are evident.
IMPRESSION: 1. Apparent prolapse of distal ileum into the cecum with associated
prominence of the ileocecal valve. No associated bowel obstruction
or wall thickening in this area. This finding warrants direct
visualization with colonoscopy to further assess this unusual
finding.

2. Mild wall thickening of several loops5 of jejunum, suspicious for
a degree of enteritis. No frank bowel obstruction.

3. Previous gastric bypass procedure without complicating features
in the postoperative regions.

4. Status post midline ventral hernia repair without hernia
recurrence. There is scarring in the anterior abdominal wall region.

5. No abscess in the abdomen or pelvis. No periappendiceal region
inflammation.

6. Gallbladder absent. Uterus absent. Postoperative change noted at
L4 and L5.

7.  Aortic Atherosclerosis ([id number]).

These results will be called to the ordering clinician or
representative by the Radiologist Assistant, and communication
documented in the PACS or [REDACTED].

## 2022-04-22 ENCOUNTER — Emergency Department (HOSPITAL_COMMUNITY)
Admission: EM | Admit: 2022-04-22 | Discharge: 2022-04-22 | Discharge status: Against Medical Advice | Payer: Medicare Other | Attending: Emergency Medicine | Admitting: Emergency Medicine

## 2022-04-22 ENCOUNTER — Encounter (HOSPITAL_COMMUNITY): Payer: Self-pay | Admitting: Emergency Medicine

## 2022-04-22 ENCOUNTER — Emergency Department (HOSPITAL_COMMUNITY): Payer: Medicare Other

## 2022-04-22 DIAGNOSIS — Z5321 Procedure and treatment not carried out due to patient leaving prior to being seen by health care provider: Secondary | ICD-10-CM | POA: Insufficient documentation

## 2022-04-22 DIAGNOSIS — R079 Chest pain, unspecified: Secondary | ICD-10-CM | POA: Diagnosis not present

## 2022-04-22 DIAGNOSIS — I10 Essential (primary) hypertension: Secondary | ICD-10-CM | POA: Diagnosis not present

## 2022-04-22 DIAGNOSIS — R0789 Other chest pain: Secondary | ICD-10-CM | POA: Diagnosis not present

## 2022-04-22 DIAGNOSIS — R002 Palpitations: Secondary | ICD-10-CM | POA: Insufficient documentation

## 2022-04-22 DIAGNOSIS — R0902 Hypoxemia: Secondary | ICD-10-CM | POA: Diagnosis not present

## 2022-04-22 LAB — BASIC METABOLIC PANEL
Anion gap: 5 (ref 5–15)
BUN: 12 mg/dL (ref 8–23)
CO2: 29 mmol/L (ref 22–32)
Calcium: 9.1 mg/dL (ref 8.9–10.3)
Chloride: 109 mmol/L (ref 98–111)
Creatinine, Ser: 1.46 mg/dL — ABNORMAL HIGH (ref 0.44–1.00)
GFR, Estimated: 39 mL/min — ABNORMAL LOW (ref >60)
Glucose, Bld: 83 mg/dL (ref 70–99)
Potassium: 4 mmol/L (ref 3.5–5.1)
Sodium: 143 mmol/L (ref 135–145)

## 2022-04-22 LAB — CBC
HCT: 34 % — ABNORMAL LOW (ref 36.0–46.0)
Hemoglobin: 10.6 g/dL — ABNORMAL LOW (ref 12.0–15.0)
MCH: 26.8 pg (ref 26.0–34.0)
MCHC: 31.2 g/dL (ref 30.0–36.0)
MCV: 85.9 fL (ref 80.0–100.0)
Platelets: 245 10*3/uL (ref 150–400)
RBC: 3.96 MIL/uL (ref 3.87–5.11)
RDW: 15.9 % — ABNORMAL HIGH (ref 11.5–15.5)
WBC: 6.5 10*3/uL (ref 4.0–10.5)
nRBC: 0 % (ref 0.0–0.2)

## 2022-04-22 LAB — TROPONIN I (HIGH SENSITIVITY): Troponin I (High Sensitivity): 3 ng/L (ref <18)

## 2022-04-22 NOTE — ED Triage Notes (Addendum)
Presents from home with [facility] for L and mid chest pain, described as ache , x 1 week. Tonight experienced palpitations while lying flat, relieved by sitting up. ? ?EKG unremarkable with EMS, EMS V/S 144/74, 52, 20, 121CBG, GCS 15 ? ?Received '324mg'$  ASA, no nitro  ? ?22G in R AC from EMS ?

## 2022-04-22 NOTE — ED Notes (Signed)
Called pt when noted no documentation that pt IV removed prior to LWBS after triage. Pt confirms that she does not have an IV in her arm at this time.  ?

## 2022-04-22 NOTE — ED Notes (Signed)
Called pt x3 for vitals recheck, no response.  ?

## 2022-04-22 NOTE — ED Notes (Signed)
Called pt again x3 for vitals recheck, still no response. I called for pt outside and was still unable to locate pt. [employer] stated they think they seen her leave. Moving pt OTF.  ?

## 2022-04-23 ENCOUNTER — Ambulatory Visit: Payer: Medicare Other | Admitting: Family Medicine

## 2022-04-24 ENCOUNTER — Other Ambulatory Visit: Payer: Self-pay

## 2022-04-24 NOTE — Patient Outreach (Signed)
Aging Gracefully Program ? ?04/24/2022 ? ?Julie Acevedo ?1951-06-08 ?[id number] ? ? ?[facility] Evaluation Interviewer made contact with patient. Aging Gracefully final survey completed.  ? ?Interviewer will send referral to Sharol Given at [facility] for follow up. ? ?Ina Homes ?Care Management Assistant ?[phone] ?

## 2022-04-26 ENCOUNTER — Telehealth: Payer: Self-pay | Admitting: Cardiovascular Disease

## 2022-04-26 NOTE — Telephone Encounter (Signed)
Patient called in to report having chest pain from left side of chest to middle of chest for the past few days and the pain is getting worse. The pain is constant. Her left arm feels cold and she has nausea. BP 142/75, P 60. Recommended that she have someone take her to the ED or call 911. She voiced understanding. ?

## 2022-04-26 NOTE — Telephone Encounter (Signed)
Pt c/o of Chest Pain: STAT if CP now or developed within 24 hours ? ?1. Are you having CP right now? Naggin pain in her chest off and on-yes ? ?2. Are you experiencing any other symptoms (ex. SOB, nausea, vomiting, sweating)? nauseated ? ?3. How long have you been experiencing CP? Been a while, but it is getting worse ? ?4. Is your CP continuous or coming and going? Comes and goes ? ?5. Have you taken Nitroglycerin? no ?? ? ?

## 2022-04-30 ENCOUNTER — Ambulatory Visit: Payer: Medicare Other | Admitting: Family Medicine

## 2022-04-30 ENCOUNTER — Ambulatory Visit: Payer: Self-pay

## 2022-04-30 NOTE — Telephone Encounter (Signed)
Pt is calling because she has diarrhea since this morning and is wanting to know what she can take? Pt has not started a new regime. Please advise  ? ? ? ?Chief Complaint: Diarrhea ?Symptoms: Above ?Frequency: Today ?Pertinent Negatives: Patient denies any pain or vomiting ?Disposition: '[]'$ ED /'[]'$ Urgent Care (no appt availability in office) / '[]'$ Appointment(In office/virtual)/ '[]'$  [place] [facility]/ '[x]'$ Home Care/ '[]'$ Refused Recommended Disposition /'[]'$ [place] [facility]/ '[]'$  Follow-up with PCP ?Additional Notes: Pt. States she has been eating grapes x 3 days "and I think that's what caused it." Reviewed home care.  ?Reason for Disposition ? MILD-MODERATE diarrhea (e.g., 1-6 times / day more than normal) ? ?Answer Assessment - Initial Assessment Questions ?1. DIARRHEA SEVERITY: "How bad is the diarrhea?" "How many more stools have you had in the past 24 hours than normal?"  ?  - NO DIARRHEA (SCALE 0) ?  - MILD (SCALE 1-3): Few loose or mushy BMs; increase of 1-3 stools over normal daily number of stools; mild increase in ostomy output. ?  -  MODERATE (SCALE 4-7): Increase of 4-6 stools daily over normal; moderate increase in ostomy output. ?* SEVERE (SCALE 8-10; OR 'WORST POSSIBLE'): Increase of 7 or more stools daily over normal; moderate increase in ostomy output; incontinence. ?    6 ?2. ONSET: "When did the diarrhea begin?"  ?    Today ?3. BM CONSISTENCY: "How loose or watery is the diarrhea?"  ?    Loose ?4. VOMITING: "Are you also vomiting?" If Yes, ask: "How many times in the past 24 hours?"  ?    No ?5. ABDOMINAL PAIN: "Are you having any abdominal pain?" If Yes, ask: "What does it feel like?" (e.g., crampy, dull, intermittent, constant)  ?    No ?6. ABDOMINAL PAIN SEVERITY: If present, ask: "How bad is the pain?"  (e.g., Scale 1-10; mild, moderate, or severe) ?  - MILD (1-3): doesn't interfere with normal activities, abdomen soft and not tender to touch  ?  - MODERATE (4-7): interferes with normal  activities or awakens from sleep, abdomen tender to touch  ?  - SEVERE (8-10): excruciating pain, doubled over, unable to do any normal activities   ?    None ?7. ORAL INTAKE: If vomiting, "Have you been able to drink liquids?" "How much liquids have you had in the past 24 hours?" ?    Yes ?8. HYDRATION: "Any signs of dehydration?" (e.g., dry mouth [not just dry lips], too weak to stand, dizziness, new weight loss) "When did you last urinate?" ?    No ?9. EXPOSURE: "Have you traveled to a foreign country recently?" "Have you been exposed to anyone with diarrhea?" "Could you have eaten any food that was spoiled?" ?    No ?10. ANTIBIOTIC USE: "Are you taking antibiotics now or have you taken antibiotics in the past 2 months?" ?      No ?11. OTHER SYMPTOMS: "Do you have any other symptoms?" (e.g., fever, blood in stool) ?      No ?12. PREGNANCY: "Is there any chance you are pregnant?" "When was your last menstrual period?" ?      No ? ?Protocols used: Diarrhea-A-AH ? ?

## 2022-04-30 NOTE — Progress Notes (Deleted)
      Established patient visit   Patient: Julie Acevedo   DOB: [birth year]   71 y.o. Female  MRN: [id number] Visit Date: 04/30/2022  Today's healthcare provider: [name], MD   No chief complaint on file.  Subjective    HPI  ***  Medications: Outpatient Medications Prior to Visit  Medication Sig   aspirin EC 81 MG EC tablet Take 1 tablet (81 mg total) by mouth daily. Swallow whole.   atorvastatin (LIPITOR) 40 MG tablet Take 1 tablet (40 mg total) by mouth daily.   calcitRIOL (ROCALTROL) 0.25 MCG capsule Take 0.25 mcg by mouth daily.   cyclobenzaprine (FLEXERIL) 5 MG tablet TAKE 1 TABLET BY MOUTH THREE TIMES A DAY AS NEEDED   diclofenac Sodium (VOLTAREN) 1 % GEL    gabapentin (NEURONTIN) 300 MG capsule Take 1 capsule (300 mg total) by mouth 3 (three) times daily.   hydrOXYzine (VISTARIL) 100 MG capsule Take 1 capsule (100 mg total) by mouth 3 (three) times daily.   LINZESS 290 MCG CAPS capsule TAKE 1 CAPSULE BY MOUTH EVERY DAY BEFORE BREAKFAST   metFORMIN (GLUCOPHAGE) 500 MG tablet TAKE 1 TABLET (500 MG TOTAL) BY MOUTH 2 (TWO) TIMES DAILY WITH A MEAL. (Patient not taking: Reported on 03/28/2022)   metoprolol succinate (TOPROL-XL) 25 MG 24 hr tablet TAKE 1 TABLET (25 MG TOTAL) BY MOUTH DAILY.   omeprazole (PRILOSEC) 20 MG capsule TAKE 1 CAPSULE BY MOUTH EVERY DAY   ondansetron (ZOFRAN) 4 MG tablet TAKE 1 TABLET BY MOUTH EVERY 8 HOURS AS NEEDED   oxyCODONE-acetaminophen (PERCOCET) 5-325 MG tablet Take 1 tablet by mouth every 4 (four) hours as needed for severe pain.   rivastigmine (EXELON) 3 MG capsule Take 3 mg by mouth 2 (two) times daily.   traMADol (ULTRAM) 50 MG tablet Take 1 tablet (50 mg total) by mouth every 6 (six) hours as needed.   traZODone (DESYREL) 50 MG tablet TAKE 1 TABLET (50 MG TOTAL) BY MOUTH AT BEDTIME AS NEEDED.   triamcinolone ointment (KENALOG) 0.5 % APPLY TO AFFECTED AREA TWICE A DAY   No facility-administered medications prior to visit.    Review  of Systems  {Labs  Heme  Chem  Endocrine  Serology  Results Review (optional):23779}   Objective    There were no vitals taken for this visit. {Show previous vital signs (optional):23777}  Physical Exam  ***  No results found for any visits on 04/30/22.  Assessment & Plan     ***  No follow-ups on file.      {provider attestation***:1}   [name], MD  [facility] [phone] (phone) [phone] (fax)  [place]

## 2022-05-01 ENCOUNTER — Ambulatory Visit (INDEPENDENT_AMBULATORY_CARE_PROVIDER_SITE_OTHER): Payer: Medicare Other | Admitting: Family Medicine

## 2022-05-01 ENCOUNTER — Encounter: Payer: Self-pay | Admitting: Family Medicine

## 2022-05-01 VITALS — BP 120/64 | HR 78 | Resp 15 | Wt 126.3 lb

## 2022-05-01 DIAGNOSIS — F02A0 Dementia in other diseases classified elsewhere, mild, without behavioral disturbance, psychotic disturbance, mood disturbance, and anxiety: Secondary | ICD-10-CM | POA: Diagnosis not present

## 2022-05-01 DIAGNOSIS — I10 Essential (primary) hypertension: Secondary | ICD-10-CM

## 2022-05-01 DIAGNOSIS — Z9884 Bariatric surgery status: Secondary | ICD-10-CM

## 2022-05-01 DIAGNOSIS — G3183 Dementia with Lewy bodies: Secondary | ICD-10-CM | POA: Diagnosis not present

## 2022-05-01 DIAGNOSIS — E538 Deficiency of other specified B group vitamins: Secondary | ICD-10-CM

## 2022-05-01 DIAGNOSIS — M5417 Radiculopathy, lumbosacral region: Secondary | ICD-10-CM | POA: Diagnosis not present

## 2022-05-01 DIAGNOSIS — R221 Localized swelling, mass and lump, neck: Secondary | ICD-10-CM | POA: Diagnosis not present

## 2022-05-01 DIAGNOSIS — E1122 Type 2 diabetes mellitus with diabetic chronic kidney disease: Secondary | ICD-10-CM

## 2022-05-01 DIAGNOSIS — N1832 Chronic kidney disease, stage 3b: Secondary | ICD-10-CM | POA: Diagnosis not present

## 2022-05-01 NOTE — Progress Notes (Signed)
Established patient visit   Patient: Julie Acevedo   DOB: 13-Mar-1951   71 y.o. Female  MRN: [id number] Visit Date: 05/01/2022  Today's healthcare provider: [name], MD   Clint Bolder as a scribe for [name], MD.,have documented all relevant documentation on the behalf of [name], MD,as directed by  [name], MD while in the presence of [name], MD.  Chief Complaint  Patient presents with   Neck Pain   Subjective    Neck Pain  This is a new problem. The current episode started 1 to 4 weeks ago. The problem has been unchanged. The pain is present in the right side. The quality of the pain is described as aching. Nothing aggravates the symptoms. Associated symptoms include headaches and photophobia. Pertinent negatives include no chest pain, fever, leg pain, numbness, pain with swallowing, paresis, syncope, tingling, trouble swallowing, visual change, weakness or weight loss. She has tried nothing for the symptoms.     Medications: Outpatient Medications Prior to Visit  Medication Sig   aspirin EC 81 MG EC tablet Take 1 tablet (81 mg total) by mouth daily. Swallow whole.   atorvastatin (LIPITOR) 40 MG tablet Take 1 tablet (40 mg total) by mouth daily.   calcitRIOL (ROCALTROL) 0.25 MCG capsule Take 0.25 mcg by mouth daily.   cyclobenzaprine (FLEXERIL) 5 MG tablet TAKE 1 TABLET BY MOUTH THREE TIMES A DAY AS NEEDED   diclofenac Sodium (VOLTAREN) 1 % GEL    hydrOXYzine (VISTARIL) 100 MG capsule Take 1 capsule (100 mg total) by mouth 3 (three) times daily.   LINZESS 290 MCG CAPS capsule TAKE 1 CAPSULE BY MOUTH EVERY DAY BEFORE BREAKFAST   metFORMIN (GLUCOPHAGE) 500 MG tablet TAKE 1 TABLET (500 MG TOTAL) BY MOUTH 2 (TWO) TIMES DAILY WITH A MEAL.   metoprolol succinate (TOPROL-XL) 25 MG 24 hr tablet TAKE 1 TABLET (25 MG TOTAL) BY MOUTH DAILY.   omeprazole (PRILOSEC) 20 MG capsule TAKE 1 CAPSULE BY MOUTH EVERY DAY    ondansetron (ZOFRAN) 4 MG tablet TAKE 1 TABLET BY MOUTH EVERY 8 HOURS AS NEEDED   oxyCODONE-acetaminophen (PERCOCET) 5-325 MG tablet Take 1 tablet by mouth every 4 (four) hours as needed for severe pain.   rivastigmine (EXELON) 3 MG capsule Take 3 mg by mouth 2 (two) times daily.   traMADol (ULTRAM) 50 MG tablet Take 1 tablet (50 mg total) by mouth every 6 (six) hours as needed.   traZODone (DESYREL) 50 MG tablet TAKE 1 TABLET (50 MG TOTAL) BY MOUTH AT BEDTIME AS NEEDED.   triamcinolone ointment (KENALOG) 0.5 % APPLY TO AFFECTED AREA TWICE A DAY   gabapentin (NEURONTIN) 300 MG capsule Take 1 capsule (300 mg total) by mouth 3 (three) times daily.   No facility-administered medications prior to visit.    Review of Systems  Constitutional:  Negative for fever and weight loss.  HENT:  Negative for trouble swallowing.   Eyes:  Positive for photophobia.  Cardiovascular:  Negative for chest pain and syncope.  Musculoskeletal:  Positive for neck pain.  Neurological:  Positive for headaches. Negative for tingling, weakness and numbness.      Objective    BP 120/64   Pulse 78   Resp 15   Wt 126 lb 4.8 oz (57.3 kg)   BMI 22.37 kg/m    Physical Exam Vitals reviewed.  Constitutional:      General: She is not in acute distress.  Appearance: She is well-developed.  HENT:     Head: Normocephalic and atraumatic.     Right Ear: Hearing normal.     Left Ear: Hearing normal.     Nose: Nose normal.  Eyes:     General: Lids are normal. No scleral icterus.       Right eye: No discharge.        Left eye: No discharge.     Conjunctiva/sclera: Conjunctivae normal.  Neck:     Comments: I think what the patient is feeling when she is palpating her neck is her hyoid bone.  She pushes from the underside and she feels the mass on the contralateral side Cardiovascular:     Rate and Rhythm: Normal rate and regular rhythm.     Heart sounds: Normal heart sounds.  Pulmonary:     Effort: Pulmonary  effort is normal. No respiratory distress.  Musculoskeletal:     Cervical back: Neck supple.  Skin:    General: Skin is warm and dry.     Findings: No lesion or rash.  Neurological:     General: No focal deficit present.     Mental Status: She is alert and oriented to person, place, and time.  Psychiatric:        Mood and Affect: Mood normal.        Speech: Speech normal.        Behavior: Behavior normal.        Thought Content: Thought content normal.        Judgment: Judgment normal.      No results found for any visits on 05/01/22.  Assessment & Plan     1. Neck mass Absolutely think this is benign and is the hyoid bone she is feeling.  She is fine with this assessment  2. Benign essential HTN Good control  3. Type 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, without long-term current use of insulin (HCC) Goal A1c less than 7  4. 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 L-S radiculopathy   6. Gastric bypass status for obesity   7. B12 deficiency    No follow-ups on file.      I, [name], MD, have reviewed all documentation for this visit. The documentation on 05/04/22 for the exam, diagnosis, procedures, and orders are all accurate and complete.    Kelani Robart Cranford Mon, MD  [facility] [phone] (phone) [phone] (fax)  [place]

## 2022-05-02 ENCOUNTER — Other Ambulatory Visit: Payer: Self-pay | Admitting: Family Medicine

## 2022-05-02 DIAGNOSIS — K130 Diseases of lips: Secondary | ICD-10-CM

## 2022-05-03 IMAGING — CR DG CERVICAL SPINE COMPLETE 4+V
1 series · 6 of 6 positions shown · non-contrast
Comparison: 09/14/2016

CLINICAL DATA: Neck pain. No known injury.

EXAM:
CERVICAL SPINE - COMPLETE 4+ VIEW

[Series 1: dg cervical spine complete · 0.14mm/px · 6 of 6 slices shown]
[im 1/6]
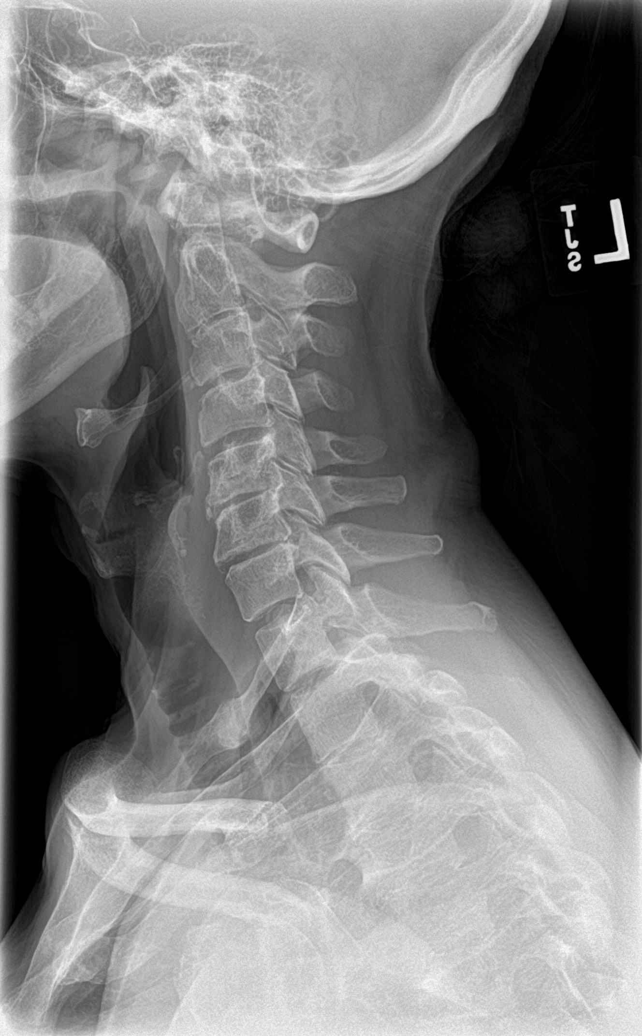
[im 2/6]
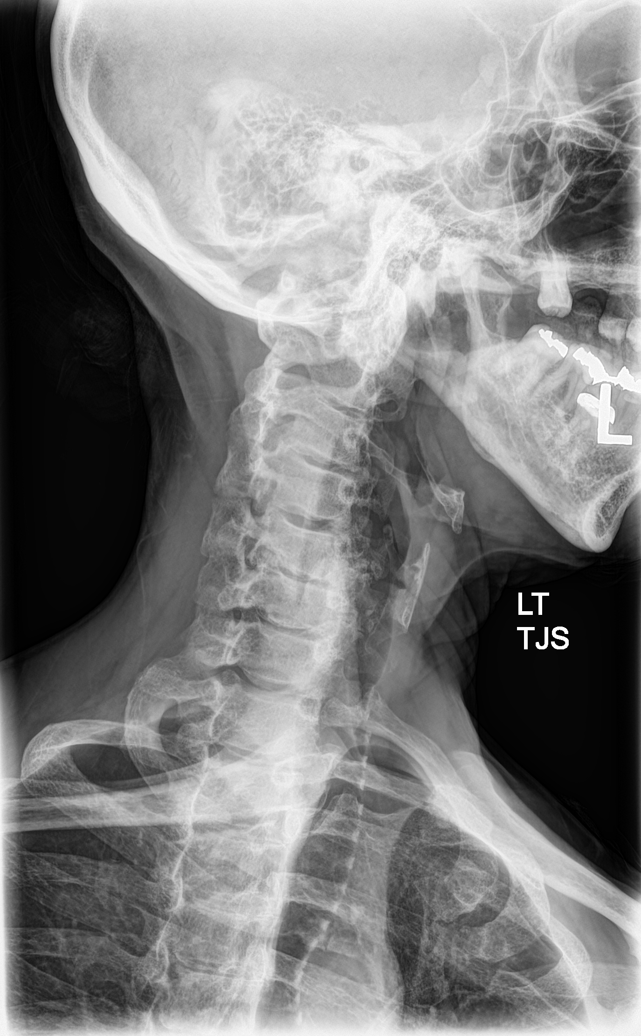
[im 3/6]
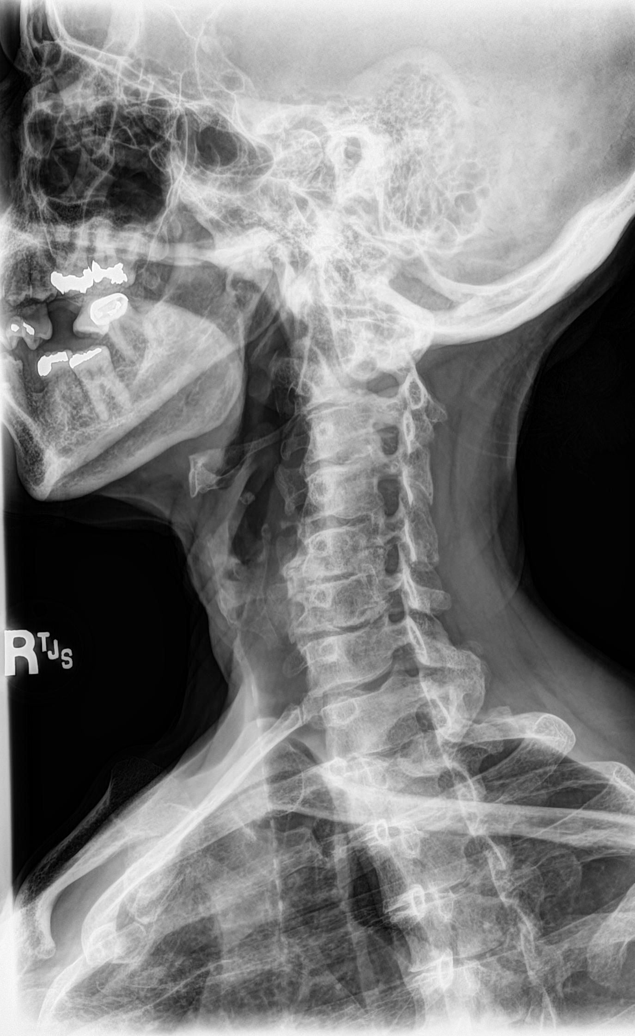
[im 4/6]
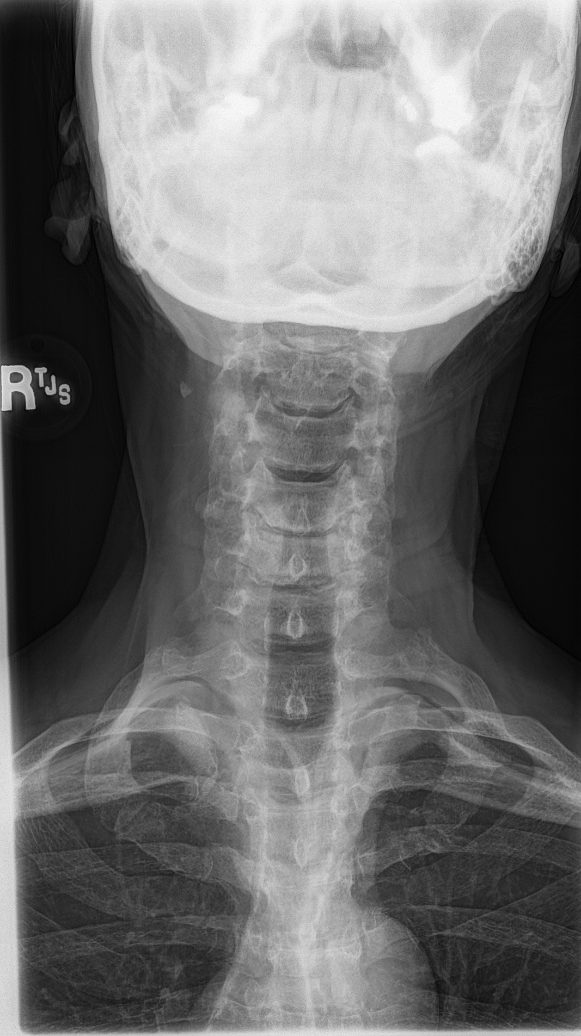
[im 5/6]
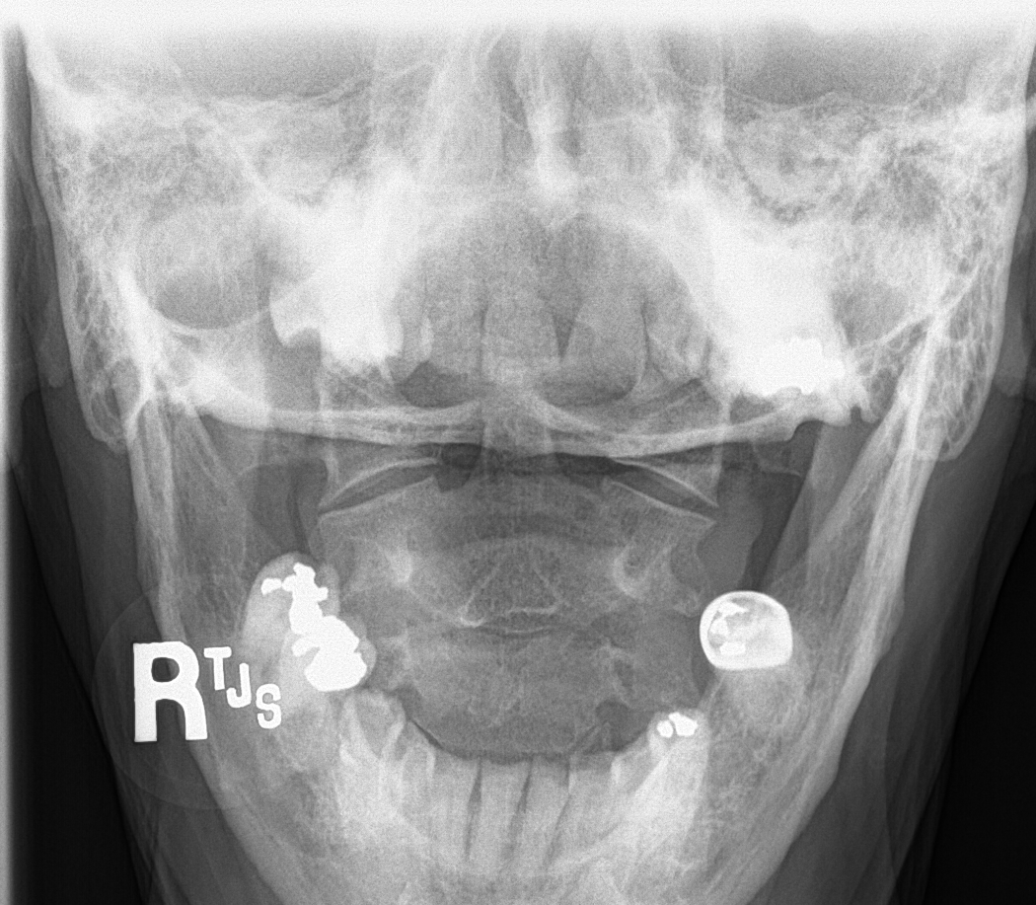
[im 6/6]
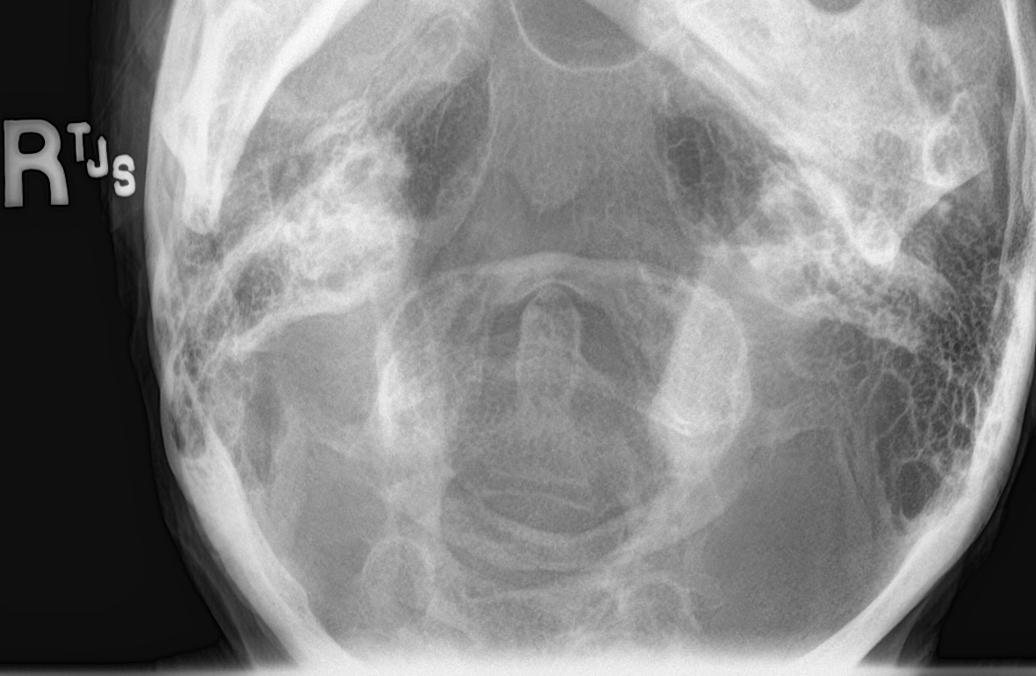

[6 of 6 positions shown; findings below may reference images not displayed]

FINDINGS: There is stable, normal alignment of the cervical spine. Moderate
disc height loss identified at C5-6. No acute fracture or traumatic
subluxation. Prevertebral soft tissues are unremarkable. The lung
apices are clear.
IMPRESSION: Stable degenerative changes at C5-6.

## 2022-05-04 ENCOUNTER — Ambulatory Visit: Payer: Medicaid Other | Admitting: Cardiovascular Disease

## 2022-05-04 DIAGNOSIS — E538 Deficiency of other specified B group vitamins: Secondary | ICD-10-CM | POA: Diagnosis not present

## 2022-05-04 DIAGNOSIS — Z8639 Personal history of other endocrine, nutritional and metabolic disease: Secondary | ICD-10-CM | POA: Diagnosis not present

## 2022-05-04 DIAGNOSIS — G8929 Other chronic pain: Secondary | ICD-10-CM | POA: Diagnosis not present

## 2022-05-04 DIAGNOSIS — R569 Unspecified convulsions: Secondary | ICD-10-CM | POA: Diagnosis not present

## 2022-05-04 DIAGNOSIS — R634 Abnormal weight loss: Secondary | ICD-10-CM | POA: Diagnosis not present

## 2022-05-04 DIAGNOSIS — G479 Sleep disorder, unspecified: Secondary | ICD-10-CM | POA: Diagnosis not present

## 2022-05-04 DIAGNOSIS — M545 Low back pain, unspecified: Secondary | ICD-10-CM | POA: Diagnosis not present

## 2022-05-05 ENCOUNTER — Other Ambulatory Visit: Payer: Self-pay | Admitting: Family Medicine

## 2022-05-05 DIAGNOSIS — K130 Diseases of lips: Secondary | ICD-10-CM

## 2022-05-07 NOTE — Telephone Encounter (Signed)
Requested medication (s) are due for refill today:   Provider to review both  Requested medication (s) are on the active medication list:   Yes for both  Future visit scheduled:   Yes   Last ordered: Kenalog 05/02/2022 30 g, 0 refills;    Zofran 03/28/2022 #20, 0 refills  Both non delegated refills   Requested Prescriptions  Pending Prescriptions Disp Refills   triamcinolone ointment (KENALOG) 0.5 % [Pharmacy Med Name: TRIAMCINOLONE 0.5% OINTMENT] 30 g 0    Sig: APPLY TO AFFECTED AREA TWICE A DAY     Not Delegated - Dermatology:  Corticosteroids Failed - 05/05/2022  9:10 AM      Failed - This refill cannot be delegated      Passed - Valid encounter within last 12 months    Recent Outpatient Visits           6 days ago Neck mass   [facility] Jerrol Banana., MD   2 months ago Type 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, without long-term current use of insulin ([place])   [facility] Jerrol Banana., MD   5 months ago 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, psychotic disturbance, mood disturbance, or anxiety ([place])   [place], [place], PA-C   7 months ago Syncope, unspecified syncope type   [facility] Jerrol Banana., MD   8 months ago Type 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, without long-term current use of insulin [facility])   [facility] Jerrol Banana., MD       Future Appointments             In 1 month Jerrol Banana., MD [facility], [facility]   In 2 months Croitoru, Mihai, MD [facility], [facility]              ondansetron (ZOFRAN) 4 MG tablet [Pharmacy Med Name: ONDANSETRON HCL 4 MG TABLET] 20 tablet 0    Sig: TAKE 1 TABLET BY MOUTH EVERY 8 HOURS AS NEEDED     Not Delegated - Gastroenterology: Antiemetics - ondansetron Failed - 05/05/2022  9:10 AM      Failed - This refill  cannot be delegated      Passed - AST in normal range and within 360 days    AST  Date Value Ref Range Status  09/05/2021 17 15 - 41 U/L Final   SGOT(AST)  Date Value Ref Range Status  01/17/2014 20 15 - 37 Unit/L Final         Passed - ALT in normal range and within 360 days    ALT  Date Value Ref Range Status  09/05/2021 7 0 - 44 U/L Final   SGPT (ALT)  Date Value Ref Range Status  01/17/2014 21 12 - 78 U/L Final         Passed - Valid encounter within last 6 months    Recent Outpatient Visits           6 days ago Neck mass   [facility] Jerrol Banana., MD   2 months ago Type 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, without long-term current use of insulin [facility])   [facility] Jerrol Banana., MD   5 months ago 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, psychotic disturbance, mood disturbance, or anxiety ([place])   [facility] Mikey Kirschner, PA-C   7 months ago Syncope, unspecified syncope type   [facility] Jerrol Banana., MD   8 months ago Type 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, without long-term current use of insulin [facility])   [facility] Jerrol Banana., MD       Future Appointments             In 1 month Jerrol Banana., MD [facility], [facility]   In 2 months Croitoru, Sloan, MD [facility], [place]

## 2022-05-08 MED ORDER — ONDANSETRON HCL 4 MG PO TABS: 4.0000 mg | ORAL_TABLET | Freq: Three times a day (TID) | ORAL | 0 refills | Status: DC | PRN

## 2022-05-20 IMAGING — DX DG FOOT COMPLETE 3+V*L*
3 series · 3 of 3 positions shown · non-contrast
Comparison: None.

CLINICAL DATA: Infection.  Diabetic.  Blister on bottom of foot.

EXAM:
LEFT FOOT - COMPLETE 3+ VIEW

[foot ap]
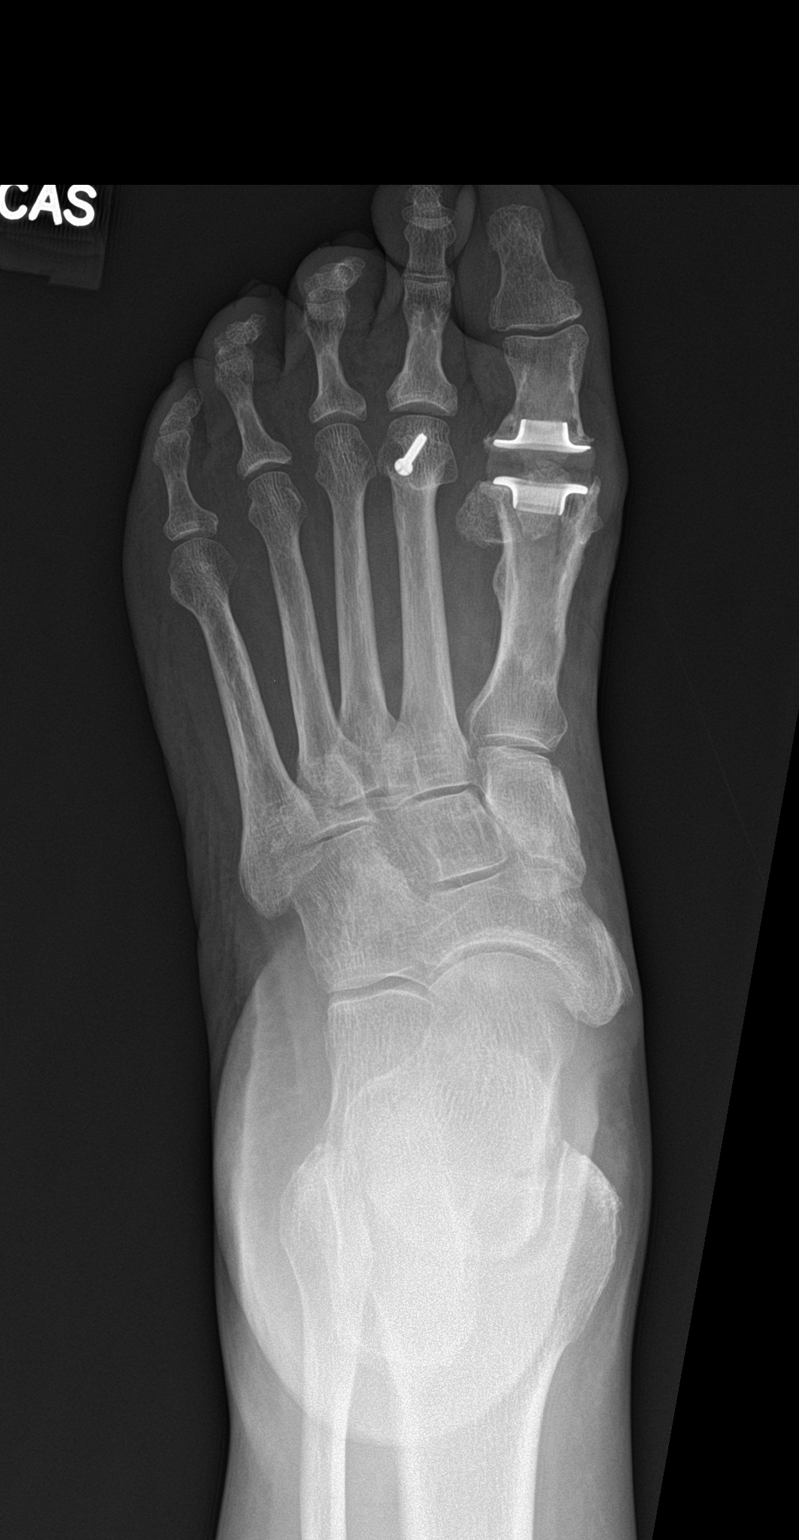

[foot obl]
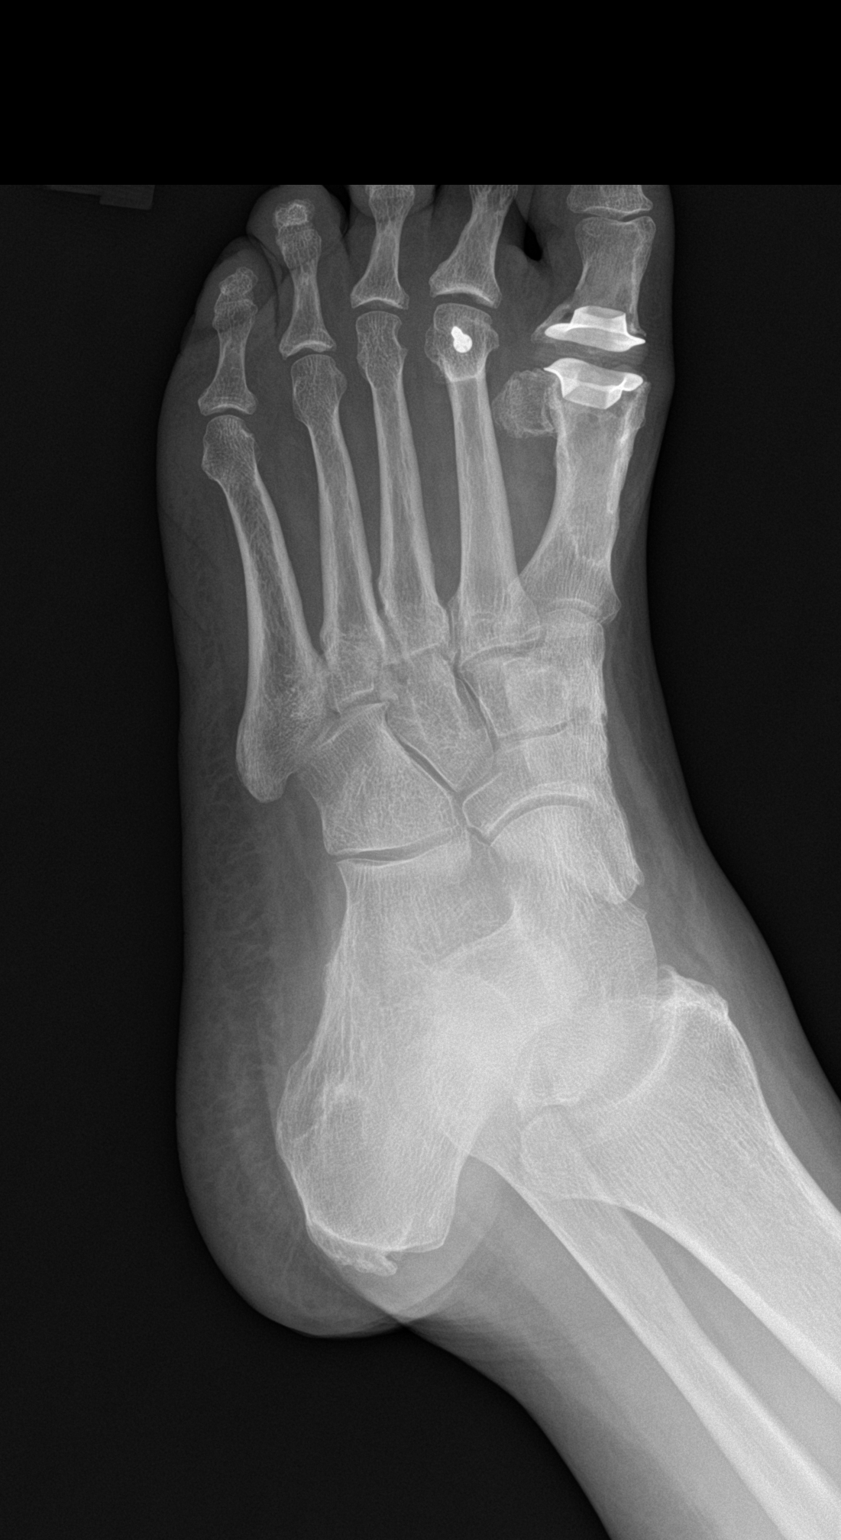

[foot lat]
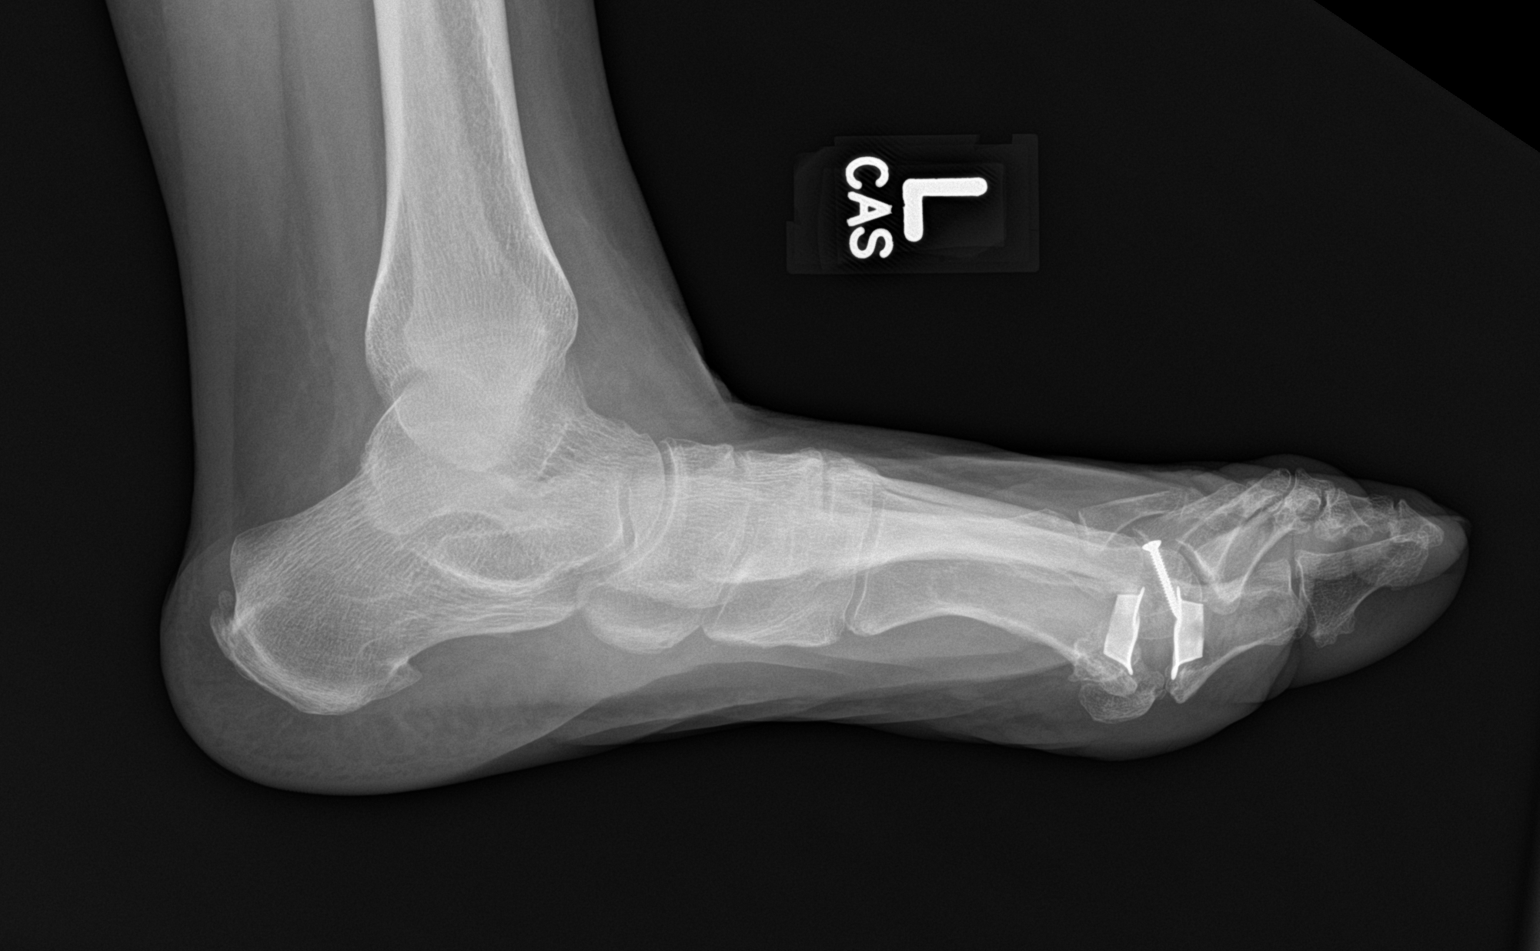

[3 of 3 positions shown; findings below may reference images not displayed]

FINDINGS: The patient is status post replacement of the first MTP joint. There
is a screw projected over the distal second metatarsal is well. No
acute fractures or bony abnormalities are identified. No bony
erosion to suggest osteomyelitis.
IMPRESSION: No evidence of osteomyelitis. No soft tissue gas identified on this
study.

## 2022-05-23 ENCOUNTER — Telehealth: Payer: Self-pay

## 2022-05-23 NOTE — Telephone Encounter (Signed)
Copied from [place] [id number]. Topic: Referral - Request for Referral >> May 23, 2022 12:46 PM Tessa Lerner A wrote: Has patient seen PCP for this complaint? Yes. *If NO, is insurance requiring patient see PCP for this issue before PCP can refer them? Referral for which specialty: Dermatology  Preferred provider/office: [facility]  Reason for referral: blackhead on back of neck

## 2022-05-24 DIAGNOSIS — E876 Hypokalemia: Secondary | ICD-10-CM | POA: Diagnosis not present

## 2022-05-24 DIAGNOSIS — R809 Proteinuria, unspecified: Secondary | ICD-10-CM | POA: Diagnosis not present

## 2022-05-24 DIAGNOSIS — E119 Type 2 diabetes mellitus without complications: Secondary | ICD-10-CM | POA: Diagnosis not present

## 2022-05-24 DIAGNOSIS — D631 Anemia in chronic kidney disease: Secondary | ICD-10-CM | POA: Diagnosis not present

## 2022-05-24 DIAGNOSIS — Z9049 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: Secondary | ICD-10-CM | POA: Insufficient documentation

## 2022-05-24 DIAGNOSIS — N1832 Chronic kidney disease, stage 3b: Secondary | ICD-10-CM | POA: Diagnosis not present

## 2022-05-24 DIAGNOSIS — I10 Essential (primary) hypertension: Secondary | ICD-10-CM | POA: Diagnosis not present

## 2022-05-24 DIAGNOSIS — E785 Hyperlipidemia, unspecified: Secondary | ICD-10-CM | POA: Diagnosis not present

## 2022-05-24 DIAGNOSIS — Z9884 Bariatric surgery status: Secondary | ICD-10-CM | POA: Diagnosis not present

## 2022-05-28 ENCOUNTER — Telehealth: Payer: Self-pay

## 2022-05-28 NOTE — Telephone Encounter (Signed)
Copied from [place] [id number]. Topic: Referral - Request for Referral >> May 28, 2022  8:45 AM Janett Billow I wrote: Has patient seen PCP for this complaint? Yes.   Referral for which specialty: Dermatology  Preferred provider/office: any  Reason for referral:mole on the back on neck./Very sore and red

## 2022-05-28 NOTE — Telephone Encounter (Signed)
Please advise referral?  

## 2022-05-29 ENCOUNTER — Other Ambulatory Visit: Payer: Self-pay | Admitting: *Deleted

## 2022-05-29 DIAGNOSIS — R233 Spontaneous ecchymoses: Secondary | ICD-10-CM

## 2022-05-29 NOTE — Telephone Encounter (Signed)
Referral ordered

## 2022-05-29 NOTE — Progress Notes (Deleted)
[place]  [phone] [phone]) [phone]  ID: Julie Acevedo OB: 11/01/1951  MR#: [id number]  BTD#:[id number]  Patient Care Team: Jerrol Banana., MD as PCP - General (Family Medicine) Yolonda Kida, MD as PCP - Cardiology (Cardiology) Lorelee Cover., MD as Consulting Physician (Ophthalmology) Bjorn Loser, MD as Consulting Physician (Urology) Bary Castilla, Forest Gleason, MD as Consulting Physician (General Surgery) Pabon, Marjory Lies, MD as Consulting Physician (General Surgery) Gardiner Barefoot, DPM as Consulting Physician (Podiatry) Gae Dry, MD (Inactive) as Referring Physician (Obstetrics and Gynecology) Vladimir Crofts, MD as Consulting Physician (Neurology) Randon Goldsmith, OT as Occupational Therapist (Occupational Therapy) Lloyd Huger, MD as Consulting Physician (Oncology)   CHIEF COMPLAINT: Iron deficiency anemia.  INTERVAL HISTORY: Patient returns to clinic today for repeat laboratory work, further evaluation, and consideration of additional IV Feraheme.  She has had a poor appetite and abdominal pain and is actively being worked up by GI, but otherwise has felt well.  She does not complain of weakness or fatigue today.  She has no neurologic complaints.  She denies any recent fevers or illnesses.  She has no chest pain, cough, hemoptysis, or shortness of breath.  She denies any nausea, vomiting, constipation, or diarrhea.  She denies any melena or hematochezia.  She has no urinary complaints.  Patient offers no further specific complaints today.  REVIEW OF SYSTEMS:   Review of Systems  Constitutional: Negative.  Negative for fever, malaise/fatigue and weight loss.  Respiratory: Negative.  Negative for cough and shortness of breath.   Cardiovascular: Negative.  Negative for chest pain and leg swelling.  Gastrointestinal:  Positive for abdominal pain. Negative for blood in stool and melena.  Genitourinary: Negative.   Negative for hematuria.  Musculoskeletal: Negative.  Negative for back pain.  Skin: Negative.  Negative for rash.  Neurological:  Negative for dizziness, focal weakness, weakness and headaches.  Psychiatric/Behavioral: Negative.  The patient is not nervous/anxious.     As per HPI. Otherwise, a complete review of systems is negative.  PAST MEDICAL HISTORY: Past Medical History:  Diagnosis Date   Anemia    Arthritis    COVID-19 11/19/2019   Diverticulitis    DM (diabetes mellitus) ([place])    typ e 2   History of methicillin resistant staphylococcus aureus (MRSA) 2017   HTN (hypertension)    Hyperlipidemia    Memory difficulties 09/01/2015   Multiple thyroid nodules    Myocardial infarction ([place]) 1995   Short-term memory loss    Thyroid nodule     PAST SURGICAL HISTORY: Past Surgical History:  Procedure Laterality Date   ABDOMINAL HYSTERECTOMY     due to endometriosis-1 ovary left   BREAST EXCISIONAL BIOPSY Right 1971   neg   CARDIAC CATHETERIZATION  2000   no significant CAD per note of Dr Caryl Comes in 2013    [place] N/A 03/19/2017   Procedure: ANTERIOR REPAIR (CYSTOCELE);  Surgeon: Bjorn Loser, MD;  Location: [facility];  Service: Urology;  Laterality: N/A;   CYSTOSCOPY N/A 03/19/2017   Procedure: CYSTOSCOPY;  Surgeon: Bjorn Loser, MD;  Location: [facility];  Service: Urology;  Laterality: N/A;   ESOPHAGOGASTRODUODENOSCOPY (EGD) WITH PROPOFOL N/A 12/15/2021   Procedure: ESOPHAGOGASTRODUODENOSCOPY (EGD) WITH PROPOFOL;  Surgeon: Lesly Rubenstein, MD;  Location: [facility];  Service: Endoscopy;  Laterality: N/A;   EYE SURGERY     FOOT SURGERY     HAND SURGERY     HEMICOLECTOMY  INSERTION OF MESH N/A 08/21/2016   Procedure: INSERTION OF MESH;  Surgeon: Excell Seltzer, MD;  Location: [facility];  Service: General;  Laterality: N/A;   INSERTION OF MESH N/A 12/08/2019   Procedure: INSERTION OF MESH;  Surgeon: Jules Husbands, MD;  Location:  [facility];  Service: General;  Laterality: N/A;   LAPAROSCOPIC LYSIS OF ADHESIONS N/A 08/21/2016   Procedure: LAPAROSCOPIC LYSIS OF ADHESIONS;  Surgeon: Excell Seltzer, MD;  Location: [facility];  Service: General;  Laterality: N/A;   LAPAROSCOPIC REMOVAL ABDOMINAL MASS     LAPAROTOMY N/A 04/16/2019   Procedure: EXPLORATORY LAPAROTOMY;  Surgeon: Benjamine Sprague, DO;  Location: [facility];  Service: General;  Laterality: N/A;   LOOP RECORDER INSERTION N/A 03/21/2018   Procedure: LOOP RECORDER INSERTION;  Surgeon: Sanda Klein, MD;  Location: [facility];  Service: Cardiovascular;  Laterality: N/A;   LUMBAR DISC SURGERY     ROUX-EN-Y GASTRIC BYPASS  2010   Dr Hassell Done   SMALL INTESTINE SURGERY     TONSILLECTOMY     UPPER GI ENDOSCOPY  01/12/2016   normal larynx, normal esophagus. gastric bypass with a normal-sized pouch and intact staple line, normal examined jejunum, otherwise exam was normal   VENTRAL HERNIA REPAIR N/A 08/21/2016   Procedure: LAPAROSCOPIC VENTRAL HERNIA;  Surgeon: Excell Seltzer, MD;  Location: [facility];  Service: General;  Laterality: N/A;   XI ROBOTIC ASSISTED VENTRAL HERNIA N/A 12/08/2019   Procedure: XI ROBOTIC ASSISTED VENTRAL HERNIA;  Surgeon: Jules Husbands, MD;  Location: [facility];  Service: General;  Laterality: N/A;    FAMILY HISTORY: Family History  Problem Relation Age of Onset   Cancer Father 87       Lung   Coronary artery disease Father 51   COPD Mother    Diabetes Mother    Hypertension Mother    Cancer Paternal Grandmother        Breast   Breast cancer Paternal Grandmother    Congestive Heart Failure Maternal Grandmother    Emphysema Maternal Grandfather    Heart attack Paternal Grandfather    Cancer Paternal Aunt        Breast   Breast cancer Paternal Aunt 20   Breast cancer Paternal Aunt 89    ADVANCED DIRECTIVES (Y/N):  N  HEALTH MAINTENANCE: Social History   Tobacco Use   Smoking status: Never   Smokeless tobacco: Never  Vaping  Use   Vaping Use: Never used  Substance Use Topics   Alcohol use: No   Drug use: No     Colonoscopy:  PAP:  Bone density:  Lipid panel:  Allergies  Allergen Reactions   Lotrel [Amlodipine Besy-Benazepril Hcl] Anaphylaxis   Contrast Media [Iodinated Contrast Media] Swelling and Other (See Comments)    Reaction:  Neck swelling    Niacin Hives   Sulfa Antibiotics Hives    Current Outpatient Medications  Medication Sig Dispense Refill   aspirin EC 81 MG EC tablet Take 1 tablet (81 mg total) by mouth daily. Swallow whole. 30 tablet 11   atorvastatin (LIPITOR) 40 MG tablet Take 1 tablet (40 mg total) by mouth daily. 30 tablet 2   calcitRIOL (ROCALTROL) 0.25 MCG capsule Take 0.25 mcg by mouth daily.     cyclobenzaprine (FLEXERIL) 5 MG tablet TAKE 1 TABLET BY MOUTH THREE TIMES A DAY AS NEEDED 60 tablet 3   diclofenac Sodium (VOLTAREN) 1 % GEL      gabapentin (NEURONTIN) 300 MG capsule Take 1 capsule (300  mg total) by mouth 3 (three) times daily. 180 capsule 3   hydrOXYzine (VISTARIL) 100 MG capsule Take 1 capsule (100 mg total) by mouth 3 (three) times daily. 90 capsule 9   LINZESS 290 MCG CAPS capsule TAKE 1 CAPSULE BY MOUTH EVERY DAY BEFORE BREAKFAST 90 capsule 0   metFORMIN (GLUCOPHAGE) 500 MG tablet TAKE 1 TABLET (500 MG TOTAL) BY MOUTH 2 (TWO) TIMES DAILY WITH A MEAL. 180 tablet 3   metoprolol succinate (TOPROL-XL) 25 MG 24 hr tablet TAKE 1 TABLET (25 MG TOTAL) BY MOUTH DAILY. 90 tablet 1   omeprazole (PRILOSEC) 20 MG capsule TAKE 1 CAPSULE BY MOUTH EVERY DAY 90 capsule 1   ondansetron (ZOFRAN) 4 MG tablet Take 1 tablet (4 mg total) by mouth every 8 (eight) hours as needed. 20 tablet 0   oxyCODONE-acetaminophen (PERCOCET) 5-325 MG tablet Take 1 tablet by mouth every 4 (four) hours as needed for severe pain. 30 tablet 0   rivastigmine (EXELON) 3 MG capsule Take 3 mg by mouth 2 (two) times daily.     traMADol (ULTRAM) 50 MG tablet Take 1 tablet (50 mg total) by mouth every 6 (six)  hours as needed. 100 tablet 3   traZODone (DESYREL) 50 MG tablet TAKE 1 TABLET (50 MG TOTAL) BY MOUTH AT BEDTIME AS NEEDED. 90 tablet 4   triamcinolone ointment (KENALOG) 0.5 % APPLY TO AFFECTED AREA TWICE A DAY 30 g 0   No current facility-administered medications for this visit.    OBJECTIVE: There were no vitals filed for this visit.    There is no height or weight on file to calculate BMI.    ECOG FS:0 - Asymptomatic  General: Well-developed, well-nourished, no acute distress. Eyes: Pink conjunctiva, anicteric sclera. HEENT: Normocephalic, moist mucous membranes. Lungs: No audible wheezing or coughing. Heart: Regular rate and rhythm. Abdomen: Soft, nontender, no obvious distention. Musculoskeletal: No edema, cyanosis, or clubbing. Neuro: Alert, answering all questions appropriately. Cranial nerves grossly intact. Skin: No rashes or petechiae noted. Psych: Normal affect.  LAB RESULTS:  Lab Results  Component Value Date   NA 143 04/22/2022   K 4.0 04/22/2022   CL 109 04/22/2022   CO2 29 04/22/2022   GLUCOSE 83 04/22/2022   BUN 12 04/22/2022   CREATININE 1.46 (H) 04/22/2022   CALCIUM 9.1 04/22/2022   PROT 6.4 (L) 09/05/2021   ALBUMIN 3.9 09/13/2021   AST 17 09/05/2021   ALT 7 09/05/2021   ALKPHOS 152 (H) 09/05/2021   BILITOT 0.6 09/05/2021   GFRNONAA 39 (L) 04/22/2022   GFRAA 42 (L) 12/05/2020    Lab Results  Component Value Date   WBC 6.5 04/22/2022   NEUTROABS 4.7 11/30/2021   HGB 10.6 (L) 04/22/2022   HCT 34.0 (L) 04/22/2022   MCV 85.9 04/22/2022   PLT 245 04/22/2022    Lab Results  Component Value Date   IRON 93 11/30/2021   TIBC 186 (L) 11/30/2021   IRONPCTSAT 50 (H) 11/30/2021   Lab Results  Component Value Date   FERRITIN 361 (H) 11/30/2021    STUDIES: No results found.   ASSESSMENT: Iron deficiency anemia.  PLAN:   1.  Iron deficiency anemia: Patient's hemoglobin remains decreased, but stable at 10.1.  Iron stores continue to be within  normal limits. Previously, all of her other laboratory work including hemolysis labs, B12, folate, and SPEP were negative or within normal limits.  Patient had a colonoscopy on September 05, 2018 that was reported as normal.  She does  not require additional IV Feraheme today.  Patient last received treatment on February 10, 2020.  Return to clinic in 4 months for repeat laboratory work, further evaluation, and continuation of treatment if needed.  2.  Abdominal pain: Continue follow-up and treatment per GI.  I spent a total of 20 minutes reviewing chart data, face-to-face evaluation with the patient, counseling and coordination of care as detailed above.   Patient expressed understanding and was in agreement with this plan. She also understands that She can call clinic at any time with any questions, concerns, or complaints.    Lloyd Huger, MD   05/29/2022 7:50 AM

## 2022-05-30 ENCOUNTER — Ambulatory Visit (INDEPENDENT_AMBULATORY_CARE_PROVIDER_SITE_OTHER): Payer: Medicare Other | Admitting: Dermatology

## 2022-05-30 ENCOUNTER — Other Ambulatory Visit: Payer: Self-pay | Admitting: *Deleted

## 2022-05-30 ENCOUNTER — Encounter: Payer: Self-pay | Admitting: Dermatology

## 2022-05-30 DIAGNOSIS — L723 Sebaceous cyst: Secondary | ICD-10-CM

## 2022-05-30 DIAGNOSIS — D509 Iron deficiency anemia, unspecified: Secondary | ICD-10-CM

## 2022-05-30 MED ORDER — MUPIROCIN 2 % EX OINT: TOPICAL_OINTMENT | CUTANEOUS | 0 refills | Status: DC

## 2022-05-30 MED ORDER — DOXYCYCLINE HYCLATE 100 MG PO TABS: ORAL_TABLET | ORAL | 0 refills | Status: DC

## 2022-05-30 NOTE — Patient Instructions (Signed)
Doxycycline should be taken with food to prevent nausea. Do not lay down for 30 minutes after taking. Be cautious with sun exposure and use good sun protection while on this medication. Pregnant women should not take this medication.   Due to recent changes in healthcare laws, you may see results of your pathology and/or laboratory studies on MyChart before the doctors have had a chance to review them. We understand that in some cases there may be results that are confusing or concerning to you. Please understand that not all results are received at the same time and often the doctors may need to interpret multiple results in order to provide you with the best plan of care or course of treatment. Therefore, we ask that you please give us 2 business days to thoroughly review all your results before contacting the office for clarification. Should we see a critical lab result, you will be contacted sooner.   If You Need Anything After Your Visit  If you have any questions or concerns for your doctor, please call our main line at [phone] and press option 4 to reach your doctor's medical assistant. If no one answers, please leave a voicemail as directed and we will return your call as soon as possible. Messages left after 4 pm will be answered the following business day.   You may also send us a message via MyChart. We typically respond to MyChart messages within 1-2 business days.  For prescription refills, please ask your pharmacy to contact our office. Our fax number is [phone].  If you have an urgent issue when the clinic is closed that cannot wait until the next business day, you can page your doctor at the number below.    Please note that while we do our best to be available for urgent issues outside of office hours, we are not available 24/7.   If you have an urgent issue and are unable to reach us, you may choose to seek medical care at your doctor's office, retail clinic, urgent care  center, or emergency room.  If you have a medical emergency, please immediately call 911 or go to the emergency department.  Pager Numbers  - Dr. Kowalski: [phone]  - Dr. Moye: [phone]  - Dr. Stewart: [phone]  In the event of inclement weather, please call our main line at [phone] for an update on the status of any delays or closures.  Dermatology Medication Tips: Please keep the boxes that topical medications come in in order to help keep track of the instructions about where and how to use these. Pharmacies typically print the medication instructions only on the boxes and not directly on the medication tubes.   If your medication is too expensive, please contact our office at [phone] option 4 or send us a message through MyChart.   We are unable to tell what your co-pay for medications will be in advance as this is different depending on your insurance coverage. However, we may be able to find a substitute medication at lower cost or fill out paperwork to get insurance to cover a needed medication.   If a prior authorization is required to get your medication covered by your insurance company, please allow us 1-2 business days to complete this process.  Drug prices often vary depending on where the prescription is filled and some pharmacies may offer cheaper prices.  The website www.goodrx.com contains coupons for medications through different pharmacies. The prices here do not account for what the   cost may be with help from insurance (it may be cheaper with your insurance), but the website can give you the price if you did not use any insurance.  - You can print the associated coupon and take it with your prescription to the pharmacy.  - You may also stop by our office during regular business hours and pick up a GoodRx coupon card.  - If you need your prescription sent electronically to a different pharmacy, notify our office through [facility] MyChart or by  phone at [phone] option 4.     Si Usted Necesita Algo Despus de Su Visita  Tambin puede enviarnos un mensaje a travs de MyChart. Por lo general respondemos a los mensajes de MyChart en el transcurso de 1 a 2 das hbiles.  Para renovar recetas, por favor pida a su farmacia que se ponga en contacto con nuestra oficina. Nuestro nmero de fax es el [phone].  Si tiene un asunto urgente cuando la clnica est cerrada y que no puede esperar hasta el siguiente da hbil, puede llamar/localizar a su doctor(a) al nmero que aparece a continuacin.   Por favor, tenga en cuenta que aunque hacemos todo lo posible para estar disponibles para asuntos urgentes fuera del horario de oficina, no estamos disponibles las 24 horas del da, los 7 das de la semana.   Si tiene un problema urgente y no puede comunicarse con nosotros, puede optar por buscar atencin mdica  en el consultorio de su doctor(a), en una clnica privada, en un centro de atencin urgente o en una sala de emergencias.  Si tiene una emergencia mdica, por favor llame inmediatamente al 911 o vaya a la sala de emergencias.  Nmeros de bper  - Dr. Kowalski: [phone]  - Dra. Moye: [phone]  - Dra. Stewart: [phone]  En caso de inclemencias del tiempo, por favor llame a nuestra lnea principal al [phone] para una actualizacin sobre el estado de cualquier retraso o cierre.  Consejos para la medicacin en dermatologa: Por favor, guarde las cajas en las que vienen los medicamentos de uso tpico para ayudarle a seguir las instrucciones sobre dnde y cmo usarlos. Las farmacias generalmente imprimen las instrucciones del medicamento slo en las cajas y no directamente en los tubos del medicamento.   Si su medicamento es muy caro, por favor, pngase en contacto con nuestra oficina llamando al [phone] y presione la opcin 4 o envenos un mensaje a travs de MyChart.   No podemos decirle cul ser su copago  por los medicamentos por adelantado ya que esto es diferente dependiendo de la cobertura de su seguro. Sin embargo, es posible que podamos encontrar un medicamento sustituto a menor costo o llenar un formulario para que el seguro cubra el medicamento que se considera necesario.   Si se requiere una autorizacin previa para que su compaa de seguros cubra su medicamento, por favor permtanos de 1 a 2 das hbiles para completar este proceso.  Los precios de los medicamentos varan con frecuencia dependiendo del lugar de dnde se surte la receta y alguna farmacias pueden ofrecer precios ms baratos.  El sitio web www.goodrx.com tiene cupones para medicamentos de diferentes farmacias. Los precios aqu no tienen en cuenta lo que podra costar con la ayuda del seguro (puede ser ms barato con su seguro), pero el sitio web puede darle el precio si no utiliz ningn seguro.  - Puede imprimir el cupn correspondiente y llevarlo con su receta a la farmacia.  - Tambin puede pasar por   nuestra oficina durante el horario de atencin regular y [employer] fundraiser una tarjeta de cupones de GoodRx.  - Si necesita que su receta se enve electrnicamente a una farmacia diferente, informe a nuestra oficina a travs de MyChart de [place] o por telfono llamando al [phone] y presione la opcin 4.

## 2022-05-30 NOTE — Progress Notes (Signed)
c 

## 2022-05-30 NOTE — Progress Notes (Signed)
   New Patient Visit  Subjective  Julie Acevedo is a 71 y.o. female who presents for the following: Irregular skin lesion (Post neck - has been there for many years but recently became tender and sore it even hurts to move her shoulder).  The following portions of the chart were reviewed this encounter and updated as appropriate:   Tobacco  Allergies  Meds  Problems  Med Hx  Surg Hx  Fam Hx     Review of Systems:  No other skin or systemic complaints except as noted in HPI or Assessment and Plan.  Objective  Well appearing patient in no apparent distress; mood and affect are within normal limits.  A focused examination was performed including the face and neck. Relevant physical exam findings are noted in the Assessment and Plan.  Post neck Crusted red papule 1.5 cm (no fluctuance)   Assessment & Plan  Inflamed cyst Post neck Not abscessed  Start Doxycycline '100mg'$  po BID x 1 week. Then decrease to one po QD x 2 weeks. Take with food. Doxycycline should be taken with food to prevent nausea. Do not lay down for 30 minutes after taking. Be cautious with sun exposure and use good sun protection while on this medication. Pregnant women should not take this medication.   Start Mupirocin 2% ointment to aa BID.   Area was cleansed with Puracyn spray, Mupirocin 2% ointment was applied and covered with a bandage.   Benign-appearing. Exam most consistent with an epidermal inclusion cyst. Discussed that a cyst is a benign growth that can grow over time and sometimes get irritated or inflamed. Recommend observation if it is not bothersome. Discussed option of surgical excision to remove it if it is growing, symptomatic, or other changes noted. Please call for new or changing lesions so they can be evaluated.  Consider excision if persistent at next visit - will schedule subsequent surgery appt. Call if change prior to follow up.  mupirocin ointment (BACTROBAN) 2 % - Post neck Apply to  aa BID until healed.  doxycycline (VIBRA-TABS) 100 MG tablet - Post neck Take one tab po BID x 1 week. Then one tab po QD for two weeks. Take with food.  Return in about 2 months (around 07/30/2022) for inflamed cyst recheck.  Luther Redo, CMA, am acting as scribe for Sarina Ser, MD . Documentation: I have reviewed the above documentation for accuracy and completeness, and I agree with the above.  Sarina Ser, MD

## 2022-05-31 ENCOUNTER — Inpatient Hospital Stay: Payer: Medicare Other | Attending: Oncology

## 2022-05-31 ENCOUNTER — Inpatient Hospital Stay: Payer: Medicare Other | Admitting: Oncology

## 2022-05-31 ENCOUNTER — Inpatient Hospital Stay: Payer: Medicare Other

## 2022-05-31 DIAGNOSIS — D509 Iron deficiency anemia, unspecified: Secondary | ICD-10-CM

## 2022-06-13 ENCOUNTER — Encounter: Payer: Self-pay | Admitting: Family Medicine

## 2022-06-13 ENCOUNTER — Ambulatory Visit (INDEPENDENT_AMBULATORY_CARE_PROVIDER_SITE_OTHER): Payer: Medicare Other | Admitting: Family Medicine

## 2022-06-13 VITALS — BP 126/61 | HR 89 | Resp 16 | Wt 123.2 lb

## 2022-06-13 DIAGNOSIS — Z9884 Bariatric surgery status: Secondary | ICD-10-CM | POA: Diagnosis not present

## 2022-06-13 DIAGNOSIS — E1122 Type 2 diabetes mellitus with diabetic chronic kidney disease: Secondary | ICD-10-CM | POA: Diagnosis not present

## 2022-06-13 DIAGNOSIS — N1832 Chronic kidney disease, stage 3b: Secondary | ICD-10-CM

## 2022-06-13 DIAGNOSIS — M5441 Lumbago with sciatica, right side: Secondary | ICD-10-CM

## 2022-06-13 DIAGNOSIS — E538 Deficiency of other specified B group vitamins: Secondary | ICD-10-CM | POA: Diagnosis not present

## 2022-06-13 DIAGNOSIS — M5417 Radiculopathy, lumbosacral region: Secondary | ICD-10-CM

## 2022-06-13 DIAGNOSIS — F02A0 Dementia in other diseases classified elsewhere, mild, without behavioral disturbance, psychotic disturbance, mood disturbance, and anxiety: Secondary | ICD-10-CM

## 2022-06-13 DIAGNOSIS — R251 Tremor, unspecified: Secondary | ICD-10-CM

## 2022-06-13 DIAGNOSIS — G3183 Dementia with Lewy bodies: Secondary | ICD-10-CM | POA: Diagnosis not present

## 2022-06-13 DIAGNOSIS — M5442 Lumbago with sciatica, left side: Secondary | ICD-10-CM | POA: Diagnosis not present

## 2022-06-13 DIAGNOSIS — I10 Essential (primary) hypertension: Secondary | ICD-10-CM

## 2022-06-13 DIAGNOSIS — G8929 Other chronic pain: Secondary | ICD-10-CM

## 2022-06-13 NOTE — Progress Notes (Signed)
Established patient visit  I,Julie Acevedo,acting as a scribe for Julie Durie, MD.,have documented all relevant documentation on the behalf of Julie Durie, MD,as directed by  Julie Durie, MD while in the presence of Julie Durie, MD.   Patient: Julie Acevedo   DOB: [birth year]   71 y.o. Female  MRN: [id number] Visit Date: 06/13/2022  Today's healthcare provider: [name], MD   Chief Complaint  Patient presents with   Follow-up   Subjective    HPI  Patient is here for 4 month follow up on chronic problems. She did a driving evaluation for Dr. [name] from neurology in past.. She admits to feeling older but in general feels well.  Has no specific complaints today. Reviewing notes she should be on mirtazapine for sleep anxiety depression and to help her gain a little weight.  She was post to stop trazodone and take the mirtazapine.  She is not sure if she is taking this or not.  Medications: Outpatient Medications Prior to Visit  Medication Sig   aspirin EC 81 MG EC tablet Take 1 tablet (81 mg total) by mouth daily. Swallow whole.   atorvastatin (LIPITOR) 40 MG tablet Take 1 tablet (40 mg total) by mouth daily.   calcitRIOL (ROCALTROL) 0.25 MCG capsule Take 0.25 mcg by mouth daily.   cyclobenzaprine (FLEXERIL) 5 MG tablet TAKE 1 TABLET BY MOUTH THREE TIMES A DAY AS NEEDED   diclofenac Sodium (VOLTAREN) 1 % GEL    doxycycline (VIBRA-TABS) 100 MG tablet Take one tab po BID x 1 week. Then one tab po QD for two weeks. Take with food.   hydrOXYzine (VISTARIL) 100 MG capsule Take 1 capsule (100 mg total) by mouth 3 (three) times daily.   LINZESS 290 MCG CAPS capsule TAKE 1 CAPSULE BY MOUTH EVERY DAY BEFORE BREAKFAST   metFORMIN (GLUCOPHAGE) 500 MG tablet TAKE 1 TABLET (500 MG TOTAL) BY MOUTH 2 (TWO) TIMES DAILY WITH A MEAL.   metoprolol succinate (TOPROL-XL) 25 MG 24 hr tablet TAKE 1 TABLET (25 MG TOTAL) BY MOUTH DAILY.   mirtazapine (REMERON) 15 MG  tablet Take 15 mg by mouth at bedtime.   mupirocin ointment (BACTROBAN) 2 % Apply to aa BID until healed.   omeprazole (PRILOSEC) 20 MG capsule TAKE 1 CAPSULE BY MOUTH EVERY DAY   ondansetron (ZOFRAN) 4 MG tablet Take 1 tablet (4 mg total) by mouth every 8 (eight) hours as needed.   oxyCODONE-acetaminophen (PERCOCET) 5-325 MG tablet Take 1 tablet by mouth every 4 (four) hours as needed for severe pain.   rivastigmine (EXELON) 3 MG capsule Take 3 mg by mouth 2 (two) times daily.   traMADol (ULTRAM) 50 MG tablet Take 1 tablet (50 mg total) by mouth every 6 (six) hours as needed.   triamcinolone ointment (KENALOG) 0.5 % APPLY TO AFFECTED AREA TWICE A DAY   gabapentin (NEURONTIN) 300 MG capsule Take 1 capsule (300 mg total) by mouth 3 (three) times daily.   [DISCONTINUED] traZODone (DESYREL) 50 MG tablet TAKE 1 TABLET (50 MG TOTAL) BY MOUTH AT BEDTIME AS NEEDED. (Patient not taking: Reported on 06/13/2022)   No facility-administered medications prior to visit.    Review of Systems  Constitutional:  Negative for appetite change, chills, fatigue and fever.  Respiratory:  Negative for chest tightness and shortness of breath.   Cardiovascular:  Negative for chest pain and palpitations.  Gastrointestinal:  Negative for abdominal pain, nausea and vomiting.  Neurological:  Negative for dizziness and weakness.       Objective    BP 126/61 (BP Location: Left Arm, Patient Position: Sitting, Cuff Size: Normal)   Pulse 89   Resp 16   Wt 123 lb 3.2 oz (55.9 kg)   SpO2 98%   BMI 21.82 kg/m    Physical Exam Constitutional:      General: She is not in acute distress.    Appearance: She is well-developed.  HENT:     Head: Normocephalic and atraumatic.     Right Ear: Hearing normal.     Left Ear: Hearing normal.     Nose: Nose normal.  Eyes:     General: Lids are normal. No scleral icterus.       Right eye: No discharge.        Left eye: No discharge.     Conjunctiva/sclera: Conjunctivae  normal.  Cardiovascular:     Rate and Rhythm: Normal rate and regular rhythm.     Heart sounds: Normal heart sounds.  Pulmonary:     Effort: Pulmonary effort is normal. No respiratory distress.  Skin:    General: Skin is warm and dry.     Findings: No lesion or rash.  Neurological:     General: No focal deficit present.     Mental Status: She is alert.  Psychiatric:        Mood and Affect: Mood normal.        Speech: Speech normal.        Behavior: Behavior normal.        Thought Content: Thought content normal.        Judgment: Judgment normal.       No results found for any visits on 06/13/22.  Assessment & Plan     1. 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  Patient to go to check on mirtazapine versus trazodone for nighttime use MMSE on next visit  2. Gastric bypass status for obesity Patient continues to lose weight presently is therefore started on the mirtazapine. Think this is related to her dementia  3. Essential hypertension Excellent right blood pressure control.  4. Type 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, without long-term current use of insulin ([place]) She is now prediabetic with all the weight she has lost.  She is on no medications that will cause hypoglycemia.  She is presently held metformin and I would actually plan to back on dose.  5. L-S radiculopathy   6. Tremor   7. Stage 3b chronic kidney disease ([place]) By  nephrology   Return in about 5 months (around 11/13/2022).      I, Julie Durie, MD, have reviewed all documentation for this visit. The documentation on 06/15/22 for the exam, diagnosis, procedures, and orders are all accurate and complete.    Dwan Hemmelgarn Cranford Mon, MD  [facility] [phone] (phone) [phone] (fax)  [place]

## 2022-06-17 ENCOUNTER — Other Ambulatory Visit: Payer: Self-pay | Admitting: Dermatology

## 2022-06-17 DIAGNOSIS — L723 Sebaceous cyst: Secondary | ICD-10-CM

## 2022-06-22 ENCOUNTER — Telehealth: Payer: Self-pay

## 2022-06-22 ENCOUNTER — Other Ambulatory Visit: Payer: Self-pay | Admitting: Family Medicine

## 2022-06-22 ENCOUNTER — Ambulatory Visit: Payer: Self-pay | Admitting: *Deleted

## 2022-06-22 DIAGNOSIS — K130 Diseases of lips: Secondary | ICD-10-CM

## 2022-06-22 NOTE — Telephone Encounter (Signed)
Called patient to offer an appointment. Patient declined. She just wants the provider to be aware. Reports that she will call back for appointment if continues.

## 2022-06-22 NOTE — Progress Notes (Signed)
    Chronic Care Management Pharmacy Assistant   Name: Julie Acevedo  MRN: [id number] DOB: February 15, 1951  Patient called to be reminded of her telephone appointment with [name], CPP on 06/25/2022 @ 1100.  No answer, left message of appointment date, time and type of appointment (either telephone or in person). Left message to have all medications, supplements, blood pressure and/or blood sugar logs available during appointment and to return call if need to reschedule.  Star Rating Drug: Atorvastatin 40 mg  last filled on 09/08/2021 for a 90-Day supply with [facility] Metformin 500 mg last filled on 07/16/2021 for a 90-Day supply with [facility]  Any gaps in medications fill history? Yes  Care Gaps: Diabetic Kidney Evaluation Diabetic Eye Exam Diabetic Foot Exam   Lynann Bologna, CPA/CMA Clinical Pharmacist Assistant Phone: [phone]

## 2022-06-22 NOTE — Telephone Encounter (Signed)
Requested medication (s) are due for refill today: yes  Requested medication (s) are on the active medication list: yes  Last refill:  05/02/22  Future visit scheduled: yes  Notes to clinic:  Unable to refill per protocol, cannot delegate.    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, psychotic disturbance, mood disturbance, or anxiety [facility])   [facility] Jerrol Banana., MD   1 month ago Neck mass   [facility] Jerrol Banana., MD   4 months ago Type 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, without long-term current use of insulin [facility])   [facility] Jerrol Banana., MD   7 months ago 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

## 2022-06-22 NOTE — Telephone Encounter (Signed)
Summary: concerns with elevated blood pressure   Pt stated her blood pressure normally is around 135/60 but recently it has been 156/70 and she is experiencing a headache. Pt requests that a nurse return her call. Cb# [phone]        Chief Complaint: elevated BP  Symptoms: headache, BP 159/71 pulse 81. Reports has had chest pain but not now. BP more elevated than usual and just informing PCP Frequency: 2-3 days Pertinent Negatives: Patient denies chest pain, no difficulty breathing. No weakness of either side of body no blurred vision Disposition: '[]'$ ED /'[]'$ Urgent Care (no appt availability in office) / '[]'$ Appointment(In office/virtual)/ '[]'$  [place] [facility]/ '[]'$ Home Care/ '[x]'$ Refused Recommended Disposition /'[]'$ [place] [facility]/ '[]'$  Follow-up with PCP Additional Notes:   Recommended appt. Patient reports if BP remains elevated she will call back for appt.  Recommended care advise and to call back if BP or symptoms worsen.     Reason for Disposition  [3] Systolic BP  >= 220 OR Diastolic >= 80 AND [2] taking BP medications  Answer Assessment - Initial Assessment Questions 1. BLOOD PRESSURE: "What is the blood pressure?" "Did you take at least two measurements 5 minutes apart?"     BP 159/71 pulse 81 2. ONSET: "When did you take your blood pressure?"     Just now  3. HOW: "How did you obtain the blood pressure?" (e.g., visiting nurse, automatic home BP monitor)     Home monitor 4. HISTORY: "Do you have a history of high blood pressure?"     Yes  5. MEDICATIONS: "Are you taking any medications for blood pressure?" "Have you missed any doses recently?"     Yes taking medications and has not missed any 6. OTHER SYMPTOMS: "Do you have any symptoms?" (e.g., headache, chest pain, blurred vision, difficulty breathing, weakness)     Headache, some chest pain reported but not significant  7. PREGNANCY: "Is there any chance you are pregnant?" "When was your last menstrual period?"      na  Protocols used: Blood Pressure - High-A-AH

## 2022-06-25 ENCOUNTER — Telehealth: Payer: Self-pay

## 2022-06-25 ENCOUNTER — Ambulatory Visit (INDEPENDENT_AMBULATORY_CARE_PROVIDER_SITE_OTHER): Payer: Medicare Other

## 2022-06-25 VITALS — BP 143/83 | HR 60

## 2022-06-25 DIAGNOSIS — N1832 Chronic kidney disease, stage 3b: Secondary | ICD-10-CM

## 2022-06-25 DIAGNOSIS — E782 Mixed hyperlipidemia: Secondary | ICD-10-CM

## 2022-06-25 DIAGNOSIS — M5416 Radiculopathy, lumbar region: Secondary | ICD-10-CM

## 2022-06-25 DIAGNOSIS — K219 Gastro-esophageal reflux disease without esophagitis: Secondary | ICD-10-CM

## 2022-06-25 DIAGNOSIS — N9419 Other specified dyspareunia: Secondary | ICD-10-CM

## 2022-06-25 DIAGNOSIS — I10 Essential (primary) hypertension: Secondary | ICD-10-CM

## 2022-06-25 MED ORDER — OMEPRAZOLE 20 MG PO CPDR: 20.0000 mg | DELAYED_RELEASE_CAPSULE | Freq: Every day | ORAL | 1 refills | Status: DC

## 2022-06-25 MED ORDER — METFORMIN HCL 500 MG PO TABS: 500.0000 mg | ORAL_TABLET | Freq: Every day | ORAL | 3 refills | Status: DC

## 2022-06-25 MED ORDER — METOPROLOL SUCCINATE ER 25 MG PO TB24: 25.0000 mg | ORAL_TABLET | Freq: Every day | ORAL | 1 refills | Status: DC

## 2022-06-25 MED ORDER — LINACLOTIDE 290 MCG PO CAPS: ORAL_CAPSULE | ORAL | 1 refills | Status: DC

## 2022-06-25 MED ORDER — ATORVASTATIN CALCIUM 40 MG PO TABS: 40.0000 mg | ORAL_TABLET | Freq: Every day | ORAL | 2 refills | Status: DC

## 2022-06-25 NOTE — Progress Notes (Signed)
    Chronic Care Management Pharmacy Assistant   Name: Julie Acevedo  MRN: [id number] DOB: [birth year]  Reason for Encounter: Wynelle Beckmann for [facility]  I received a task from [name], CPP requesting that I complete the onboarding process for the patient to be switched to [facility].  [facility] and spoke with the Pharmacist Elta Guadeloupe who confirmed that they would send the patient's profile transfer over to [facility].  I contacted Dr. Trena Platt Office [phone] and spoke with the representative whom advised she would send the updated pharmacy request back to the proper person.  I contacted Dr. Lillia Dallas Office @ [phone] and had to leave a message with the updated pharmacy information.   Contacted the patient to request the approximate supply of Gabapentin and Hydroxyzine that she has on hand. Per patient she has about 10 Gabapentin left and she is completely out of Hydroxyzine. All forms have been completed and sent back to the CPP for review.  Medications: Outpatient Encounter Medications as of 06/25/2022  Medication Sig   aspirin EC 81 MG EC tablet Take 1 tablet (81 mg total) by mouth daily. Swallow whole.   atorvastatin (LIPITOR) 40 MG tablet Take 1 tablet (40 mg total) by mouth daily.   calcitRIOL (ROCALTROL) 0.25 MCG capsule Take 0.25 mcg by mouth daily.   cyclobenzaprine (FLEXERIL) 5 MG tablet TAKE 1 TABLET BY MOUTH THREE TIMES A DAY AS NEEDED   diclofenac Sodium (VOLTAREN) 1 % GEL    doxycycline (VIBRA-TABS) 100 MG tablet Take one tab po BID x 1 week. Then one tab po QD for two weeks. Take with food.   gabapentin (NEURONTIN) 300 MG capsule Take 1 capsule (300 mg total) by mouth 3 (three) times daily.   hydrOXYzine (VISTARIL) 100 MG capsule Take 1 capsule (100 mg total) by mouth 3 (three) times daily.   linaclotide (LINZESS) 290 MCG CAPS capsule TAKE 1 CAPSULE BY MOUTH EVERY DAY BEFORE BREAKFAST   metFORMIN (GLUCOPHAGE) 500 MG tablet Take 1 tablet  (500 mg total) by mouth daily with breakfast.   metoprolol succinate (TOPROL-XL) 25 MG 24 hr tablet Take 1 tablet (25 mg total) by mouth daily.   mirtazapine (REMERON) 15 MG tablet Take 15 mg by mouth at bedtime.   mupirocin ointment (BACTROBAN) 2 % Apply to aa BID until healed.   omeprazole (PRILOSEC) 20 MG capsule Take 1 capsule (20 mg total) by mouth daily.   ondansetron (ZOFRAN) 4 MG tablet Take 1 tablet (4 mg total) by mouth every 8 (eight) hours as needed.   oxyCODONE-acetaminophen (PERCOCET) 5-325 MG tablet Take 1 tablet by mouth every 4 (four) hours as needed for severe pain.   rivastigmine (EXELON) 3 MG capsule Take 3 mg by mouth 2 (two) times daily.   traMADol (ULTRAM) 50 MG tablet Take 1 tablet (50 mg total) by mouth every 6 (six) hours as needed.   triamcinolone ointment (KENALOG) 0.5 % APPLY TO AFFECTED AREA TWICE A DAY   No facility-administered encounter medications on file as of 06/25/2022.   Lynann Bologna, CPA/CMA Clinical Pharmacist Assistant Phone: [phone]

## 2022-06-25 NOTE — Progress Notes (Signed)
Chronic Care Management Pharmacy Note  06/25/2022 Name:  Julie Acevedo MRN:  [id number] DOB:  Jul 31, 1951  Summary: Patient presents for CCM follow-up.   -Home blood pressures elevated in the 150s, one instance of 160s. Has noticed a mild headache more frequently.   -She has multiple medications that are overdue to be filled and has been out of atorvastatin and metformin for some time.  Recommendations/Changes made from today's visit: -Continue current medications for now, counseled patient to cut down on caffeine intake, continue sodium restriction and monitor blood pressure daily.   -HC to check on home and nurse visit blood pressures this Friday.   -Patient onboarded to [facility], refills of atorvastatin and metformin sent.   Plan: HC BP assessment in <1 week  CPP follow-up 1 month  Subjective: Julie Acevedo is an 71 y.o. year old female who is a primary patient of Jerrol Banana., MD.  The CCM team was consulted for assistance with disease management and care coordination needs.    Engaged with patient by telephone for follow up visit in response to provider referral for pharmacy case management and/or care coordination services.   Consent to Services:  The patient was given information about Chronic Care Management services, agreed to services, and gave verbal consent prior to initiation of services.  Please see initial visit note for detailed documentation.   Patient Care Team: Jerrol Banana., MD as PCP - General (Family Medicine) Yolonda Kida, MD as PCP - Cardiology (Cardiology) Lorelee Cover., MD as Consulting Physician (Ophthalmology) Bjorn Loser, MD as Consulting Physician (Urology) Bary Castilla, Forest Gleason, MD as Consulting Physician (General Surgery) Jules Husbands, MD as Consulting Physician (General Surgery) Gardiner Barefoot, DPM as Consulting Physician (Podiatry) Gae Dry, MD as Referring Physician (Obstetrics and  Gynecology) Vladimir Crofts, MD as Consulting Physician (Neurology) Randon Goldsmith, OT as Occupational Therapist (Occupational Therapy) Lloyd Huger, MD as Consulting Physician (Oncology)  Recent office visits: 06/13/22: Patient presented to Dr. Rosanna Randy for follow-up. Stopped trazodone.   05/01/22: Patient presented to Dr. Rosanna Randy for follow-up.   Recent consult visits: 05/24/22: Patient presented to Dr. Lanora Manis (Nephrology) for follow-up.  05/04/22: Patient presented to Dr. [name] (Neurology) for follow-up. Stop trazodone, start mirtazapine 15 mg nightly.   Hospital visits: 04/22/22: Patient presented to ED for chest pain.   Objective:  Lab Results  Component Value Date   CREATININE 1.46 (H) 04/22/2022   BUN 12 04/22/2022   GFRNONAA 39 (L) 04/22/2022   GFRAA 42 (L) 12/05/2020   NA 143 04/22/2022   K 4.0 04/22/2022   CALCIUM 9.1 04/22/2022   CO2 29 04/22/2022    Lab Results  Component Value Date/Time   HGBA1C 5.1 02/13/2022 02:36 PM   HGBA1C 5.6 09/06/2021 04:43 AM   HGBA1C 5.3 08/29/2021 09:03 AM   HGBA1C 5.5 03/06/2021 04:26 PM   HGBA1C 6.3 01/18/2014 06:32 AM   MICROALBUR 20 06/23/2020 02:10 PM   MICROALBUR 50 11/18/2017 09:33 AM    Last diabetic Eye exam:  Lab Results  Component Value Date/Time   HMDIABEYEEXA No Retinopathy 12/03/2019 12:00 AM    Last diabetic Foot exam: No results found for: "HMDIABFOOTEX"   Lab Results  Component Value Date   CHOL 134 09/06/2021   HDL 60 09/06/2021   LDLCALC 63 09/06/2021   TRIG 54 09/06/2021   CHOLHDL 2.2 09/06/2021       Latest Ref Rng & Units 09/13/2021    2:33 PM  09/05/2021    2:14 PM 06/19/2021   11:49 PM  Hepatic Function  Total Protein 6.5 - 8.1 g/dL  6.4  6.6   Albumin 3.8 - 4.8 g/dL 3.9  3.3  3.4   AST 15 - 41 U/L  17  13   ALT 0 - 44 U/L  7  8   Alk Phosphatase 38 - 126 U/L  152  168   Total Bilirubin 0.3 - 1.2 mg/dL  0.6  0.4     Lab Results  Component Value Date/Time   TSH 1.896 06/28/2021  01:23 PM   TSH 3.730 12/05/2020 08:55 AM   TSH 2.170 08/18/2020 11:36 AM   FREET4 0.91 06/28/2021 01:23 PM       Latest Ref Rng & Units 04/22/2022    1:16 AM 11/30/2021   12:51 PM 09/13/2021    2:33 PM  CBC  WBC 4.0 - 10.5 K/uL 6.5  7.3  8.6   Hemoglobin 12.0 - 15.0 g/dL 10.6  10.5  11.3   Hematocrit 36.0 - 46.0 % 34.0  33.3  33.5   Platelets 150 - 400 K/uL 245  285  262     Lab Results  Component Value Date/Time   VD25OH 8.7 (L) 06/16/2019 03:51 PM    Clinical ASCVD: Yes  The ASCVD Risk score (Arnett DK, et al., 2019) failed to calculate for the following reasons:   The patient has a prior MI or stroke diagnosis       04/24/2022    1:17 PM 03/28/2022    8:30 AM 12/26/2021   11:13 AM  Depression screen PHQ 2/9  Decreased Interest 0  3  Down, Depressed, Hopeless 0 0 0  PHQ - 2 Score 0 0 3  Altered sleeping   0  Tired, decreased energy   0  Change in appetite   0  Feeling bad or failure about yourself    0  Trouble concentrating   0  Moving slowly or fidgety/restless   3  Suicidal thoughts   0  PHQ-9 Score   6  Difficult doing work/chores   Very difficult    Social History   Tobacco Use  Smoking Status Never  Smokeless Tobacco Never   BP Readings from Last 3 Encounters:  06/25/22 (!) 143/83  06/13/22 126/61  05/01/22 120/64   Pulse Readings from Last 3 Encounters:  06/25/22 60  06/13/22 89  05/01/22 78   Wt Readings from Last 3 Encounters:  06/13/22 123 lb 3.2 oz (55.9 kg)  05/01/22 126 lb 4.8 oz (57.3 kg)  04/22/22 124 lb (56.2 kg)    Assessment/Interventions: Review of patient past medical history, allergies, medications, health status, including review of consultants reports, laboratory and other test data, was performed as part of comprehensive evaluation and provision of chronic care management services.   SDOH:  (Social Determinants of Health) assessments and interventions performed: Yes      CCM Care Plan  Allergies  Allergen Reactions    Lotrel [Amlodipine Besy-Benazepril Hcl] Anaphylaxis   Contrast Media [Iodinated Contrast Media] Swelling and Other (See Comments)    Reaction:  Neck swelling    Niacin Hives   Sulfa Antibiotics Hives    Medications Reviewed Today     Reviewed by Julieta Bellini, CMA (Certified Medical Assistant) on 06/13/22 at 74  Med List Status: <None>   Medication Order Taking? Sig Documenting Provider Last Dose Status Informant  aspirin EC 81 MG EC tablet [id number] Yes Take 1 tablet (  81 mg total) by mouth daily. Swallow whole. Pokhrel, Laxman, MD Taking Active   atorvastatin (LIPITOR) 40 MG tablet [id number] Yes Take 1 tablet (40 mg total) by mouth daily. Pokhrel, Laxman, MD Taking Active   calcitRIOL (ROCALTROL) 0.25 MCG capsule [id number] Yes Take 0.25 mcg by mouth daily. [provider] Taking Active Pharmacy Records  cyclobenzaprine (FLEXERIL) 5 MG tablet [id number] Yes TAKE 1 TABLET BY MOUTH THREE TIMES A DAY AS NEEDED Jerrol Banana., MD Taking Active   diclofenac Sodium (VOLTAREN) 1 % GEL [id number] Yes  [provider] Taking Active   doxycycline (VIBRA-TABS) 100 MG tablet [id number] Yes Take one tab po BID x 1 week. Then one tab po QD for two weeks. Take with food. Ralene Bathe, MD Taking Active   gabapentin (NEURONTIN) 300 MG capsule [id number]  Take 1 capsule (300 mg total) by mouth 3 (three) times daily. Gae Dry, MD  Expired 12/26/21 2359   hydrOXYzine (VISTARIL) 100 MG capsule [id number] Yes Take 1 capsule (100 mg total) by mouth 3 (three) times daily. Gae Dry, MD Taking Active   LINZESS 290 MCG CAPS capsule [id number] Yes TAKE 1 CAPSULE BY MOUTH EVERY DAY BEFORE BREAKFAST Jerrol Banana., MD Taking Active   metFORMIN (GLUCOPHAGE) 500 MG tablet [id number] Yes TAKE 1 TABLET (500 MG TOTAL) BY MOUTH 2 (TWO) TIMES DAILY WITH A MEAL. Jerrol Banana., MD Taking Active Pharmacy Records           Med Note [facility]   Mon Feb 12, 2022  10:14 AM) Reports taking 500 mg once daily.  metoprolol succinate (TOPROL-XL) 25 MG 24 hr tablet [id number] Yes TAKE 1 TABLET (25 MG TOTAL) BY MOUTH DAILY. Jerrol Banana., MD Taking Active   mirtazapine (REMERON) 15 MG tablet [id number] Yes Take 15 mg by mouth at bedtime. [provider] Taking Active   mupirocin ointment (BACTROBAN) 2 % [id number] Yes Apply to aa BID until healed. Ralene Bathe, MD Taking Active   omeprazole (PRILOSEC) 20 MG capsule [id number] Yes TAKE 1 CAPSULE BY MOUTH EVERY DAY Jerrol Banana., MD Taking Active   ondansetron [facility]) 4 MG tablet [id number] Yes Take 1 tablet (4 mg total) by mouth every 8 (eight) hours as needed. Jerrol Banana., MD Taking Active   oxyCODONE-acetaminophen [facility]) 5-325 MG tablet [id number] Yes Take 1 tablet by mouth every 4 (four) hours as needed for severe pain. Felipa Furnace, DPM Taking Active   rivastigmine (EXELON) 3 MG capsule [id number] Yes Take 3 mg by mouth 2 (two) times daily. [provider] Taking Active Pharmacy Records  traMADol (ULTRAM) 50 MG tablet [id number] Yes Take 1 tablet (50 mg total) by mouth every 6 (six) hours as needed. 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, psychotic disturbance, mood disturbance, or anxiety ([place]) 11/22/2021   TIA (transient ischemic attack) 09/05/2021   Stomatitis and mucositis 07/21/2021   Cheilitis 07/21/2021   Proteinuria, unspecified 05/04/2021   Stage 3b chronic kidney disease ([place]) 05/04/2021   Anemia in chronic kidney disease 05/04/2021   Hypokalemia 05/04/2021   Other and unspecified hyperlipidemia 03/17/2021   Callus of foot 01/19/2021  Pes planus 01/19/2021   PTTD (posterior tibial tendon  dysfunction) 01/19/2021   Acute flank pain 12/15/2020   Cystitis 12/15/2020   Urinary symptom or sign 12/15/2020   Low back pain radiating to right lower extremity 06/23/2020   Gastroesophageal reflux disease 04/28/2020   Large bowel obstruction ([place]) 04/17/2019   Acquired trigger finger 03/20/2019   Iron deficiency anemia 11/21/2018   Syncope 03/18/2018   Anemia 03/18/2018   Exophthalmos 02/22/2018   Multiple thyroid nodules 02/22/2018   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   Pain at surgical site 08/30/2016   Ventral incisional hernia 08/21/2016   SBO (small bowel obstruction) (HCC)    Partial small bowel obstruction ([place]) 04/23/2016   Adjustment disorder with mixed anxiety and depressed mood 04/23/2016   Memory difficulties 09/01/2015   Arthritis 06/11/2015   Back pain, chronic 06/11/2015   HLD (hyperlipidemia) 06/11/2015   Cannot sleep 06/11/2015   L-S radiculopathy 06/11/2015   Arthritis of knee, degenerative 06/11/2015   B12 deficiency 06/11/2015   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   Type 2 diabetes mellitus ([place]) 11/15/2012   Avitaminosis D 11/07/2009   Narrowing of intervertebral disc space 07/25/2009   Benign essential HTN 07/25/2009    Immunization History  Administered Date(s) Administered   Fluad Quad(high Dose 65+) 08/15/2019, 09/01/2020, 09/13/2021   Influenza Split 10/17/2010, 10/02/2011, 08/27/2012   Influenza, High Dose Seasonal PF 08/29/2018   Influenza,inj,Quad PF,6+ Mos 09/26/2013, 09/06/2014, 09/06/2016   Influenza,inj,Quad PF,6-35 Mos 08/31/2017   Influenza-Unspecified 09/15/2015   PFIZER Comirnaty(Gray Top)Covid-19 Tri-Sucrose Vaccine 04/05/2021   PFIZER(Purple Top)SARS-COV-2 Vaccination 01/29/2020, 02/23/2020, 10/11/2020   PNEUMOCOCCAL CONJUGATE-20 06/24/2021   Pneumococcal Conjugate-13 08/29/2018   Pneumococcal Polysaccharide-23 11/17/2012   Tdap 10/02/2011, 09/27/2016   Zoster  Recombinat (Shingrix) 08/29/2018, 08/15/2019   Zoster, Live 09/06/2016    Conditions to be addressed/monitored:  Hypertension, Hyperlipidemia, Diabetes, GERD and Chronic Pain  Care Plan : General Pharmacy (Adult)  Updates made by Germaine Pomfret, RPH since 06/25/2022 12:00 AM     Problem: Hypertension, Hyperlipidemia, Diabetes, GERD and Chronic Pain   Priority: High     Long-Range Goal: Patient-Specific Goal   Start Date: 02/27/2021  Expected End Date: 09/19/2022  This Visit's Progress: On track  Recent Progress: On track  Priority: High  Note:   Current Barriers:  Unable to achieve control of cholesterol  Suboptimal therapeutic regimen for hypertension  Pharmacist Clinical Goal(s):  patient will achieve control of cholesterol as evidenced by LDL less than 70 maintain control of blood pressure as evidenced by BP less than 140/90 and improvement of dizziness symptoms  through collaboration with PharmD and provider.   Interventions: 1:1 collaboration with Jerrol Banana., MD regarding development and update of comprehensive plan of care as evidenced by provider attestation and co-signature Inter-disciplinary care team collaboration (see longitudinal plan of care) Comprehensive medication review performed; medication list updated in electronic medical record  Hypertension (BP goal <140/90) -Not ideally controlled -Current treatment:   Metoprolol XL 25 mg daily  -Medications previously tried: HCTZ (Hypotension)  -Current home readings: 158/71, 156s. Consistently in the 150s-160s for the past 3 weeks. Pulse 60s-80s. Blood pressure machine is 69 years old.  -Reports mild headache.  -Current dietary habits: Denies any recent changes in sodium of caffeine intake. Does report 3 glasses of tea and a few sips of Pepsi daily, which is her baseline.  -Current exercise habits: Working in garden.  -Continue current medications for now, counseled patient  to cut down on caffeine  intake, continue sodium restriction and monitor blood pressure daily.  -HC to check on home and nurse visit blood pressures this Friday.   Hyperlipidemia: (LDL goal < 70) -History of MI 1999 -History of TIA Sep 2022 -Uncontrolled -Current treatment: Atorvastatin 40 mg daily -Medications previously tried: NA  -She has been out of atorvastatin and metformin for sometime.  -Continue current medications   Diabetes (A1c goal <7%) -Controlled -Current medications: Metformin 500 mg daily  -Medications previously tried: NA  -Current home glucose readings fasting glucose:   -Denies hypoglycemic/hyperglycemic symptoms -She has been out of atorvastatin and metformin for sometime. -Continue current medications  Dementia (Goal: Prevent further decline in memory) -Managed by Dr. [name] -Uncontrolled -Current treatment  Rivastigmine 3 mg twice daily  -Medications previously tried: NA -Continue current medications  Patient Goals/Self-Care Activities patient will:  - check blood pressure 2-3 times weekly, document, and provide at future appointments target a minimum of 150 minutes of moderate intensity exercise weekly  Follow Up Plan: Telephone follow up appointment with care management team member scheduled for:  07/31/2022 at 11:00 AM     Medication Assistance: None required.  Patient affirms current coverage meets needs.  Compliance/Adherence/Medication fill history: Care Gaps: Diabetic Eye Exam Urine Microalbumin COVID-19 Vaccine Booster 5  Star-Rating Drugs: Atorvastatin 40 mg ast filled on 09/08/2021 for a 90-Day supply Metformin 500 mg last filled on 07/16/2021 for a 90-Day supply with [facility]  Patient's preferred pharmacy is:  [facility] - [place], [place] - [address] [place] [address] Phone: [phone] Fax: [phone]  Uses pill box? No Pt endorses 50% compliance  We discussed: Verbal consent  obtained for [facility] enhanced pharmacy services (medication synchronization, adherence packaging, delivery coordination). A medication sync plan was created to allow patient to get all medications delivered once every 30 to 90 days per patient preference. Patient understands they have freedom to choose pharmacy and clinical pharmacist will coordinate care between all prescribers and [facility].  Patient decided to: Utilize [facility] for medication synchronization, packaging and delivery  Care Plan and Follow Up Patient Decision:  Patient agrees to Care Plan and Follow-up.  Plan: Telephone follow up appointment with care management team member scheduled for:  07/31/2022 at 11:00 AM

## 2022-06-25 NOTE — Patient Instructions (Signed)
Visit Information It was great speaking with you today!  Please let me know if you have any questions about our visit.   Goals Addressed             This Visit's Progress    Track and Manage My Blood Pressure-Hypertension       Timeframe:  Long-Range Goal Priority:  High Start Date: 06/25/2022                            Expected End Date: 06/26/2023                      Follow Up within 7 days   - check blood pressure daily    Why is this important?   You won't feel high blood pressure, but it can still hurt your blood vessels.  High blood pressure can cause heart or kidney problems. It can also cause a stroke.  Making lifestyle changes like losing a little weight or eating less salt will help.  Checking your blood pressure at home and at different times of the day can help to control blood pressure.  If the doctor prescribes medicine remember to take it the way the doctor ordered.  Call the office if you cannot afford the medicine or if there are questions about it.     Notes:         Patient Care Plan: General Pharmacy (Adult)     Problem Identified: Hypertension, Hyperlipidemia, Diabetes, GERD and Chronic Pain   Priority: High     Long-Range Goal: Patient-Specific Goal   Start Date: 02/27/2021  Expected End Date: 09/19/2022  This Visit's Progress: On track  Recent Progress: On track  Priority: High  Note:   Current Barriers:  Unable to achieve control of cholesterol  Suboptimal therapeutic regimen for hypertension  Pharmacist Clinical Goal(s):  patient will achieve control of cholesterol as evidenced by LDL less than 70 maintain control of blood pressure as evidenced by BP less than 140/90 and improvement of dizziness symptoms  through collaboration with PharmD and provider.   Interventions: 1:1 collaboration with Jerrol Banana., MD regarding development and update of comprehensive plan of care as evidenced by provider attestation and  co-signature Inter-disciplinary care team collaboration (see longitudinal plan of care) Comprehensive medication review performed; medication list updated in electronic medical record  Hypertension (BP goal <140/90) -Not ideally controlled -Current treatment:   Metoprolol XL 25 mg daily  -Medications previously tried: HCTZ (Hypotension)  -Current home readings: 158/71, 156s. Consistently in the 150s-160s for the past 3 weeks. Pulse 60s-80s. Blood pressure machine is 69 years old.  -Reports mild headache.  -Current dietary habits: Denies any recent changes in sodium of caffeine intake. Does report 3 glasses of tea and a few sips of Pepsi daily, which is her baseline.  -Current exercise habits: Working in garden.  -Continue current medications for now, counseled patient to cut down on caffeine intake, continue sodium restriction and monitor blood pressure daily.  -HC to check on home and nurse visit blood pressures this Friday.   Hyperlipidemia: (LDL goal < 70) -History of MI 1999 -History of TIA Sep 2022 -Uncontrolled -Current treatment: Atorvastatin 40 mg daily -Medications previously tried: NA  -She has been out of atorvastatin and metformin for sometime.  -Continue current medications   Diabetes (A1c goal <7%) -Controlled -Current medications: Metformin 500 mg daily  -Medications previously tried: NA  -Current home glucose  readings fasting glucose:   -Denies hypoglycemic/hyperglycemic symptoms -She has been out of atorvastatin and metformin for sometime. -Continue current medications  Dementia (Goal: Prevent further decline in memory) -Managed by Dr. [name] -Uncontrolled -Current treatment  Rivastigmine 3 mg twice daily  -Medications previously tried: NA -Continue current medications  Patient Goals/Self-Care Activities patient will:  - check blood pressure 2-3 times weekly, document, and provide at future appointments target a minimum of 150 minutes of moderate  intensity exercise weekly  Follow Up Plan: Telephone follow up appointment with care management team member scheduled for:  07/31/2022 at 11:00 AM    Patient agreed to services and verbal consent obtained.   The patient verbalized understanding of instructions, educational materials, and care plan provided today and DECLINED offer to receive copy of patient instructions, educational materials, and care plan.   [name], PharmD, [place], CPP  Clinical Pharmacist Practitioner  [facility] [phone]

## 2022-06-26 NOTE — Progress Notes (Signed)
[place]  [phone] [phone]) [phone]  ID: Julie Acevedo OB: May 29, 1951  MR#: [id number]  JIR#:[id number]  Patient Care Team: Jerrol Banana., MD as PCP - General (Family Medicine) Yolonda Kida, MD as PCP - Cardiology (Cardiology) Lorelee Cover., MD as Consulting Physician (Ophthalmology) Bjorn Loser, MD as Consulting Physician (Urology) Bary Castilla, Forest Gleason, MD as Consulting Physician (General Surgery) Pabon, Marjory Lies, MD as Consulting Physician (General Surgery) Gardiner Barefoot, DPM as Consulting Physician (Podiatry) Gae Dry, MD as Referring Physician (Obstetrics and Gynecology) Vladimir Crofts, MD as Consulting Physician (Neurology) Randon Goldsmith, OT as Occupational Therapist (Occupational Therapy) Lloyd Huger, MD as Consulting Physician (Oncology)   CHIEF COMPLAINT: Iron deficiency anemia.  INTERVAL HISTORY: Patient returns to clinic today for repeat laboratory work, further evaluation, consideration of additional IV Venofer.  She currently feels well and is asymptomatic.  She does not complain of any weakness or fatigue.  Her abdominal pain has resolved with dietary changes.  She has no neurologic complaints.  She denies any recent fevers or illnesses.  She has no chest pain, cough, hemoptysis, or shortness of breath.  She denies any nausea, vomiting, constipation, or diarrhea.  She denies any melena or hematochezia.  She has no urinary complaints.  Patient offers no further specific complaints today.  REVIEW OF SYSTEMS:   Review of Systems  Constitutional: Negative.  Negative for fever, malaise/fatigue and weight loss.  Respiratory: Negative.  Negative for cough and shortness of breath.   Cardiovascular: Negative.  Negative for chest pain and leg swelling.  Gastrointestinal: Negative.  Negative for abdominal pain, blood in stool and melena.  Genitourinary: Negative.  Negative for hematuria.   Musculoskeletal: Negative.  Negative for back pain.  Skin: Negative.  Negative for rash.  Neurological:  Negative for dizziness, focal weakness, weakness and headaches.  Psychiatric/Behavioral: Negative.  The patient is not nervous/anxious.     As per HPI. Otherwise, a complete review of systems is negative.  PAST MEDICAL HISTORY: Past Medical History:  Diagnosis Date   Anemia    Arthritis    COVID-19 11/19/2019   Diverticulitis    DM (diabetes mellitus) ([place])    typ e 2   History of methicillin resistant staphylococcus aureus (MRSA) 2017   HTN (hypertension)    Hyperlipidemia    Memory difficulties 09/01/2015   Multiple thyroid nodules    Myocardial infarction ([place]) 1995   Short-term memory loss    Thyroid nodule     PAST SURGICAL HISTORY: Past Surgical History:  Procedure Laterality Date   ABDOMINAL HYSTERECTOMY     due to endometriosis-1 ovary left   BREAST EXCISIONAL BIOPSY Right 1971   neg   CARDIAC CATHETERIZATION  2000   no significant CAD per note of Dr Caryl Comes in 2013    [place] N/A 03/19/2017   Procedure: ANTERIOR REPAIR (CYSTOCELE);  Surgeon: Bjorn Loser, MD;  Location: [facility];  Service: Urology;  Laterality: N/A;   CYSTOSCOPY N/A 03/19/2017   Procedure: CYSTOSCOPY;  Surgeon: Bjorn Loser, MD;  Location: [facility];  Service: Urology;  Laterality: N/A;   ESOPHAGOGASTRODUODENOSCOPY (EGD) WITH PROPOFOL N/A 12/15/2021   Procedure: ESOPHAGOGASTRODUODENOSCOPY (EGD) WITH PROPOFOL;  Surgeon: Lesly Rubenstein, MD;  Location: [facility];  Service: Endoscopy;  Laterality: N/A;   EYE SURGERY     FOOT SURGERY     HAND SURGERY     HEMICOLECTOMY     INSERTION OF MESH N/A 08/21/2016  Procedure: INSERTION OF MESH;  Surgeon: Excell Seltzer, MD;  Location: [facility];  Service: General;  Laterality: N/A;   INSERTION OF MESH N/A 12/08/2019   Procedure: INSERTION OF MESH;  Surgeon: Jules Husbands, MD;  Location: [facility];  Service:  General;  Laterality: N/A;   LAPAROSCOPIC LYSIS OF ADHESIONS N/A 08/21/2016   Procedure: LAPAROSCOPIC LYSIS OF ADHESIONS;  Surgeon: Excell Seltzer, MD;  Location: [facility];  Service: General;  Laterality: N/A;   LAPAROSCOPIC REMOVAL ABDOMINAL MASS     LAPAROTOMY N/A 04/16/2019   Procedure: EXPLORATORY LAPAROTOMY;  Surgeon: Benjamine Sprague, DO;  Location: [facility];  Service: General;  Laterality: N/A;   LOOP RECORDER INSERTION N/A 03/21/2018   Procedure: LOOP RECORDER INSERTION;  Surgeon: Sanda Klein, MD;  Location: [facility];  Service: Cardiovascular;  Laterality: N/A;   LUMBAR DISC SURGERY     ROUX-EN-Y GASTRIC BYPASS  2010   Dr Hassell Done   SMALL INTESTINE SURGERY     TONSILLECTOMY     UPPER GI ENDOSCOPY  01/12/2016   normal larynx, normal esophagus. gastric bypass with a normal-sized pouch and intact staple line, normal examined jejunum, otherwise exam was normal   VENTRAL HERNIA REPAIR N/A 08/21/2016   Procedure: LAPAROSCOPIC VENTRAL HERNIA;  Surgeon: Excell Seltzer, MD;  Location: [facility];  Service: General;  Laterality: N/A;   XI ROBOTIC ASSISTED VENTRAL HERNIA N/A 12/08/2019   Procedure: XI ROBOTIC ASSISTED VENTRAL HERNIA;  Surgeon: Jules Husbands, MD;  Location: [facility];  Service: General;  Laterality: N/A;    FAMILY HISTORY: Family History  Problem Relation Age of Onset   Cancer Father 36       Lung   Coronary artery disease Father 45   COPD Mother    Diabetes Mother    Hypertension Mother    Cancer Paternal Grandmother        Breast   Breast cancer Paternal Grandmother    Congestive Heart Failure Maternal Grandmother    Emphysema Maternal Grandfather    Heart attack Paternal Grandfather    Cancer Paternal Aunt        Breast   Breast cancer Paternal Aunt 77   Breast cancer Paternal Aunt 39    ADVANCED DIRECTIVES (Y/N):  N  HEALTH MAINTENANCE: Social History   Tobacco Use   Smoking status: Never   Smokeless tobacco: Never  Vaping Use   Vaping Use:  Never used  Substance Use Topics   Alcohol use: No   Drug use: No     Colonoscopy:  PAP:  Bone density:  Lipid panel:  Allergies  Allergen Reactions   Lotrel [Amlodipine Besy-Benazepril Hcl] Anaphylaxis   Contrast Media [Iodinated Contrast Media] Swelling and Other (See Comments)    Reaction:  Neck swelling    Niacin Hives   Sulfa Antibiotics Hives    Current Outpatient Medications  Medication Sig Dispense Refill   aspirin EC 81 MG EC tablet Take 1 tablet (81 mg total) by mouth daily. Swallow whole. 30 tablet 11   atorvastatin (LIPITOR) 40 MG tablet Take 1 tablet (40 mg total) by mouth daily. 30 tablet 2   calcitRIOL (ROCALTROL) 0.25 MCG capsule Take 0.25 mcg by mouth daily.     cyclobenzaprine (FLEXERIL) 5 MG tablet TAKE 1 TABLET BY MOUTH THREE TIMES A DAY AS NEEDED 60 tablet 3   diclofenac Sodium (VOLTAREN) 1 % GEL      doxycycline (VIBRA-TABS) 100 MG tablet Take one tab po BID x 1 week. Then one tab  po QD for two weeks. Take with food. 28 tablet 0   hydrOXYzine (VISTARIL) 100 MG capsule Take 1 capsule (100 mg total) by mouth 3 (three) times daily. 90 capsule 9   linaclotide (LINZESS) 290 MCG CAPS capsule TAKE 1 CAPSULE BY MOUTH EVERY DAY BEFORE BREAKFAST 90 capsule 1   metFORMIN (GLUCOPHAGE) 500 MG tablet Take 1 tablet (500 mg total) by mouth daily with breakfast. 90 tablet 3   metoprolol succinate (TOPROL-XL) 25 MG 24 hr tablet Take 1 tablet (25 mg total) by mouth daily. 90 tablet 1   mirtazapine (REMERON) 15 MG tablet Take 15 mg by mouth at bedtime.     mupirocin ointment (BACTROBAN) 2 % Apply to aa BID until healed. 22 g 0   omeprazole (PRILOSEC) 20 MG capsule Take 1 capsule (20 mg total) by mouth daily. 90 capsule 1   ondansetron (ZOFRAN) 4 MG tablet Take 1 tablet (4 mg total) by mouth every 8 (eight) hours as needed. 20 tablet 0   oxyCODONE-acetaminophen (PERCOCET) 5-325 MG tablet Take 1 tablet by mouth every 4 (four) hours as needed for severe pain. 30 tablet 0    rivastigmine (EXELON) 3 MG capsule Take 3 mg by mouth 2 (two) times daily.     traMADol (ULTRAM) 50 MG tablet Take 1 tablet (50 mg total) by mouth every 6 (six) hours as needed. 100 tablet 3   triamcinolone ointment (KENALOG) 0.5 % APPLY TO AFFECTED AREA TWICE A DAY 30 g 0   gabapentin (NEURONTIN) 300 MG capsule Take 1 capsule (300 mg total) by mouth 3 (three) times daily. 180 capsule 3   No current facility-administered medications for this visit.    OBJECTIVE: Vitals:   06/28/22 1424  BP: (!) 151/73  Pulse: (!) 59  Resp: 18  Temp: 97.7 F (36.5 C)     Body mass index is 22.14 kg/m.    ECOG FS:0 - Asymptomatic  General: Well-developed, well-nourished, no acute distress. Eyes: Pink conjunctiva, anicteric sclera. HEENT: Normocephalic, moist mucous membranes. Lungs: No audible wheezing or coughing. Heart: Regular rate and rhythm. Abdomen: Soft, nontender, no obvious distention. Musculoskeletal: No edema, cyanosis, or clubbing. Neuro: Alert, answering all questions appropriately. Cranial nerves grossly intact. Skin: No rashes or petechiae noted. Psych: Normal affect.   LAB RESULTS:  Lab Results  Component Value Date   NA 143 04/22/2022   K 4.0 04/22/2022   CL 109 04/22/2022   CO2 29 04/22/2022   GLUCOSE 83 04/22/2022   BUN 12 04/22/2022   CREATININE 1.46 (H) 04/22/2022   CALCIUM 9.1 04/22/2022   PROT 6.4 (L) 09/05/2021   ALBUMIN 3.9 09/13/2021   AST 17 09/05/2021   ALT 7 09/05/2021   ALKPHOS 152 (H) 09/05/2021   BILITOT 0.6 09/05/2021   GFRNONAA 39 (L) 04/22/2022   GFRAA 42 (L) 12/05/2020    Lab Results  Component Value Date   WBC 7.4 06/28/2022   NEUTROABS 4.5 06/28/2022   HGB 11.1 (L) 06/28/2022   HCT 35.3 (L) 06/28/2022   MCV 86.5 06/28/2022   PLT 284 06/28/2022    Lab Results  Component Value Date   IRON 56 06/28/2022   TIBC 242 (L) 06/28/2022   IRONPCTSAT 23 06/28/2022   Lab Results  Component Value Date   FERRITIN 516 (H) 06/28/2022     STUDIES: No results found.   ASSESSMENT: Iron deficiency anemia.  PLAN:   1.  Iron deficiency anemia: Patient's hemoglobin remains decreased, but mildly improved to 11.1.  Iron stores continue  to be within normal limits.  Previously, all of her other laboratory work including hemolysis labs, B12, folate, and SPEP were negative or within normal limits.  Patient had a colonoscopy on September 05, 2018 that was reported as normal.  She does not require IV Venofer today.  Patient last received treatment on December 13, 2021.  Return to clinic in 4 months with repeat laboratory work, further evaluation, and continuation of treatment if needed.   2.  Abdominal pain: Resolved. 3.  Renal insufficiency: Mild, patient's most recent creatinine is 1.46.    Patient expressed understanding and was in agreement with this plan. She also understands that She can call clinic at any time with any questions, concerns, or complaints.    Lloyd Huger, MD   06/29/2022 1:39 PM

## 2022-06-28 ENCOUNTER — Encounter: Payer: Self-pay | Admitting: Oncology

## 2022-06-28 ENCOUNTER — Inpatient Hospital Stay: Payer: Medicare Other | Attending: Oncology

## 2022-06-28 ENCOUNTER — Inpatient Hospital Stay (HOSPITAL_BASED_OUTPATIENT_CLINIC_OR_DEPARTMENT_OTHER): Payer: Medicare Other | Admitting: Oncology

## 2022-06-28 ENCOUNTER — Inpatient Hospital Stay: Payer: Medicare Other

## 2022-06-28 VITALS — BP 151/73 | HR 59 | Temp 97.7°F | Resp 18 | Wt 125.0 lb

## 2022-06-28 DIAGNOSIS — N289 Disorder of kidney and ureter, unspecified: Secondary | ICD-10-CM | POA: Diagnosis not present

## 2022-06-28 DIAGNOSIS — D509 Iron deficiency anemia, unspecified: Secondary | ICD-10-CM | POA: Insufficient documentation

## 2022-06-28 LAB — CBC WITH DIFFERENTIAL/PLATELET
Abs Immature Granulocytes: 0.02 10*3/uL (ref 0.00–0.07)
Basophils Absolute: 0 10*3/uL (ref 0.0–0.1)
Basophils Relative: 0 %
Eosinophils Absolute: 0.1 10*3/uL (ref 0.0–0.5)
Eosinophils Relative: 2 %
HCT: 35.3 % — ABNORMAL LOW (ref 36.0–46.0)
Hemoglobin: 11.1 g/dL — ABNORMAL LOW (ref 12.0–15.0)
Immature Granulocytes: 0 %
Lymphocytes Relative: 30 %
Lymphs Abs: 2.2 10*3/uL (ref 0.7–4.0)
MCH: 27.2 pg (ref 26.0–34.0)
MCHC: 31.4 g/dL (ref 30.0–36.0)
MCV: 86.5 fL (ref 80.0–100.0)
Monocytes Absolute: 0.5 10*3/uL (ref 0.1–1.0)
Monocytes Relative: 7 %
Neutro Abs: 4.5 10*3/uL (ref 1.7–7.7)
Neutrophils Relative %: 61 %
Platelets: 284 10*3/uL (ref 150–400)
RBC: 4.08 MIL/uL (ref 3.87–5.11)
RDW: 13.9 % (ref 11.5–15.5)
WBC: 7.4 10*3/uL (ref 4.0–10.5)
nRBC: 0 % (ref 0.0–0.2)

## 2022-06-28 LAB — IRON AND TIBC
Iron: 56 ug/dL (ref 28–170)
Saturation Ratios: 23 % (ref 10.4–31.8)
TIBC: 242 ug/dL — ABNORMAL LOW (ref 250–450)
UIBC: 186 ug/dL

## 2022-06-28 LAB — FERRITIN: Ferritin: 516 ng/mL — ABNORMAL HIGH (ref 11–307)

## 2022-06-28 MED FILL — Iron Sucrose Inj 20 MG/ML (Fe Equiv): INTRAVENOUS | Qty: 10 | Status: AC

## 2022-06-28 NOTE — Progress Notes (Signed)
Patient denies any concerns today.  

## 2022-06-29 ENCOUNTER — Encounter: Payer: Self-pay | Admitting: Oncology

## 2022-06-29 ENCOUNTER — Telehealth: Payer: Self-pay

## 2022-06-29 NOTE — Telephone Encounter (Signed)
Spoke with Merleen Nicely at [facility]. States Aniceto Boss maybe on the clinical team. She will send message out to let them know I returned the call. I advised Dr. Kenton Kingfisher was the prescribing provider for these medications. He is no longer with our practice. The patient is not currently scheduled with another provider at our practice. Suggested they request refills from patient's PCP. If there is an issue, please reach out to [place] and we will send request to another provider within the office.

## 2022-06-29 NOTE — Telephone Encounter (Signed)
[facility] VOICEMAIL: Tasha w/[facility] reports patient is transferring her rx's to them. They did a profile transfer from [facility], but she needs a refill of Gabapentin '300mg'$   TID, and Vistaril '100mg'$  TID. [phone]

## 2022-07-02 ENCOUNTER — Telehealth: Payer: Self-pay

## 2022-07-02 MED ORDER — HYDROXYZINE PAMOATE 100 MG PO CAPS: 100.0000 mg | ORAL_CAPSULE | Freq: Three times a day (TID) | ORAL | 1 refills | Status: DC

## 2022-07-02 MED ORDER — GABAPENTIN 300 MG PO CAPS: 300.0000 mg | ORAL_CAPSULE | Freq: Three times a day (TID) | ORAL | 1 refills | Status: DC

## 2022-07-02 NOTE — Progress Notes (Signed)
    Chronic Care Management Pharmacy Assistant   Name: Julie Acevedo  MRN: [id number] DOB: February 18, 1951  Reason for Encounter: Hypertension and Heart Rate Follow-up  I received a task from [name], CPP requesting that I check in with the patient regarding her blood pressure numbers and her heart rate numbers.   I contacted the patient and she advised that she has not had a nurse come out to her home in a few weeks, but per patient she checks her own blood pressure in the morning and before bed.  The patient gave me her last 3 readings.  07/16 Morning 135/77 Pulse 64           Afternoon 146/77 Pulse 58  07/17 Morning 140/84 Pulse 60  CPP has been sent the task back to review the note.  Patient's OBGYN also added a message to the chart regarding refills on patient's Hydroxyzine 100 mg and Gabapentin 300 mg stating that the provider of these medications no longer work with the practice and the patient would need to make an appointment to establish with a new provider. The suggestion was to have PCP refill medications for the patient. I informed CPP and he was able to send these scripts to [facility] for the patient.  Medications: Outpatient Encounter Medications as of 07/02/2022  Medication Sig   aspirin EC 81 MG EC tablet Take 1 tablet (81 mg total) by mouth daily. Swallow whole.   atorvastatin (LIPITOR) 40 MG tablet Take 1 tablet (40 mg total) by mouth daily.   calcitRIOL (ROCALTROL) 0.25 MCG capsule Take 0.25 mcg by mouth daily.   cyclobenzaprine (FLEXERIL) 5 MG tablet TAKE 1 TABLET BY MOUTH THREE TIMES A DAY AS NEEDED   diclofenac Sodium (VOLTAREN) 1 % GEL    doxycycline (VIBRA-TABS) 100 MG tablet Take one tab po BID x 1 week. Then one tab po QD for two weeks. Take with food.   gabapentin (NEURONTIN) 300 MG capsule Take 1 capsule (300 mg total) by mouth 3 (three) times daily.   hydrOXYzine (VISTARIL) 100 MG capsule Take 1 capsule (100 mg total) by mouth 3 (three) times daily.    linaclotide (LINZESS) 290 MCG CAPS capsule TAKE 1 CAPSULE BY MOUTH EVERY DAY BEFORE BREAKFAST   metFORMIN (GLUCOPHAGE) 500 MG tablet Take 1 tablet (500 mg total) by mouth daily with breakfast.   metoprolol succinate (TOPROL-XL) 25 MG 24 hr tablet Take 1 tablet (25 mg total) by mouth daily.   mirtazapine (REMERON) 15 MG tablet Take 15 mg by mouth at bedtime.   mupirocin ointment (BACTROBAN) 2 % Apply to aa BID until healed.   omeprazole (PRILOSEC) 20 MG capsule Take 1 capsule (20 mg total) by mouth daily.   ondansetron (ZOFRAN) 4 MG tablet Take 1 tablet (4 mg total) by mouth every 8 (eight) hours as needed.   oxyCODONE-acetaminophen (PERCOCET) 5-325 MG tablet Take 1 tablet by mouth every 4 (four) hours as needed for severe pain.   rivastigmine (EXELON) 3 MG capsule Take 3 mg by mouth 2 (two) times daily.   traMADol (ULTRAM) 50 MG tablet Take 1 tablet (50 mg total) by mouth every 6 (six) hours as needed.   triamcinolone ointment (KENALOG) 0.5 % APPLY TO AFFECTED AREA TWICE A DAY   No facility-administered encounter medications on file as of 07/02/2022.    Lynann Bologna, CPA/CMA Clinical Pharmacist Assistant Phone: [phone]

## 2022-07-02 NOTE — Addendum Note (Signed)
Addended by: Daron Offer A on: 07/02/2022 03:13 PM   Modules accepted: Orders

## 2022-07-06 ENCOUNTER — Telehealth: Payer: Self-pay | Admitting: Family Medicine

## 2022-07-06 NOTE — Telephone Encounter (Signed)
[name]  from [facility]  (p) [phone]  (f) [phone]  Checking on the status of diabetic supplies, free style libre 2, diabetic shoes, form was faxed to [phone], caller will refax, please note when received.

## 2022-07-09 NOTE — Telephone Encounter (Signed)
Fax was received last week. Waiting for provider's signature.

## 2022-07-09 NOTE — Telephone Encounter (Signed)
Referral was ordered. 

## 2022-07-11 NOTE — Telephone Encounter (Signed)
Julie Acevedo received the RX for [employer] / but they need the RX for diabetic shoes and last OV notes / Please advise a fax to [phone]

## 2022-07-12 ENCOUNTER — Encounter: Payer: Self-pay | Admitting: Oncology

## 2022-07-16 ENCOUNTER — Ambulatory Visit: Payer: Medicare Other | Admitting: Family Medicine

## 2022-07-16 DIAGNOSIS — E782 Mixed hyperlipidemia: Secondary | ICD-10-CM

## 2022-07-16 DIAGNOSIS — I10 Essential (primary) hypertension: Secondary | ICD-10-CM

## 2022-07-16 DIAGNOSIS — E1122 Type 2 diabetes mellitus with diabetic chronic kidney disease: Secondary | ICD-10-CM

## 2022-07-16 DIAGNOSIS — N1832 Chronic kidney disease, stage 3b: Secondary | ICD-10-CM | POA: Diagnosis not present

## 2022-07-16 NOTE — Telephone Encounter (Signed)
Rx and notes are ready. Waiting on provider's signature.

## 2022-07-19 ENCOUNTER — Ambulatory Visit (INDEPENDENT_AMBULATORY_CARE_PROVIDER_SITE_OTHER): Payer: Medicare Other | Admitting: Cardiovascular Disease

## 2022-07-19 ENCOUNTER — Encounter: Payer: Self-pay | Admitting: Cardiovascular Disease

## 2022-07-19 VITALS — BP 132/66 | HR 56 | Ht 63.0 in | Wt 123.6 lb

## 2022-07-19 DIAGNOSIS — N1832 Chronic kidney disease, stage 3b: Secondary | ICD-10-CM | POA: Diagnosis not present

## 2022-07-19 DIAGNOSIS — E7800 Pure hypercholesterolemia, unspecified: Secondary | ICD-10-CM

## 2022-07-19 DIAGNOSIS — E1122 Type 2 diabetes mellitus with diabetic chronic kidney disease: Secondary | ICD-10-CM | POA: Diagnosis not present

## 2022-07-19 DIAGNOSIS — Z87898 Personal history of other specified conditions: Secondary | ICD-10-CM

## 2022-07-19 DIAGNOSIS — R072 Precordial pain: Secondary | ICD-10-CM | POA: Diagnosis not present

## 2022-07-19 DIAGNOSIS — I10 Essential (primary) hypertension: Secondary | ICD-10-CM | POA: Diagnosis not present

## 2022-07-19 DIAGNOSIS — Z4509 Encounter for adjustment and management of other cardiac device: Secondary | ICD-10-CM

## 2022-07-19 NOTE — Progress Notes (Signed)
Cardiology office Note   Date:  07/19/2022   ID:  Julie Acevedo 24-Oct-1951, MRN [id number]  PCP:  Jerrol Banana., MD  Cardiologist:  Yolonda Kida, MD /Joylene Wescott (ILR) Electrophysiologist:  None   Evaluation Performed:  Follow-Up Visit  Chief Complaint:  chest pain  History of Present Illness:    Julie Acevedo is a 71 y.o. female with history of a single syncopal event in April 2018 (no recurrence and no arrhythmia recorded by implantable loop recorder during 3-year follow-up).  She also has diet-controlled diabetes mellitus with chronic kidney disease (stage III), mild hypertension, occasional palpitations and hyperlipidemia.  She does not have known coronary or peripheral vascular disease.  Her loop recorder has reached end of service.  She has not had syncope or presyncope.  She occasionally has an ache in her left chest that radiates into her shoulder.  This occurs unpredictably at rest and is not associated with physical activity or emotional states.  It has some positional qualities and is most likely musculoskeletal.  He does not have relationship to breathing, coughing or eating.  She has had fewer complaints of orthostatic dizziness since stopping her diuretic.  Blood pressure remains well controlled.  Diabetes control is exceptionally good with a hemoglobin A1c of 5.1%.  She is taking only very low-dose of metformin on days when her blood sugar is high.  Her most recent cholesterol profile from last September showed an LDL of 63 and HDL of 60.  In September 2022 she underwent an elective EGD but subsequent developed altered mental status, glucose level was 44 but symptoms did not resolve with correction of hypoglycemia.  She was admitted for strokelike symptoms, received tPA, had an echocardiogram and carotid duplex ultrasound, both of which showed normal findings.  CT scan was normal.  Past Medical History:  Diagnosis Date   Anemia    Arthritis    COVID-19  11/19/2019   Diverticulitis    DM (diabetes mellitus) ([place])    typ e 2   History of methicillin resistant staphylococcus aureus (MRSA) 2017   HTN (hypertension)    Hyperlipidemia    Memory difficulties 09/01/2015   Multiple thyroid nodules    Myocardial infarction ([place]) 1995   Short-term memory loss    Thyroid nodule    Past Surgical History:  Procedure Laterality Date   ABDOMINAL HYSTERECTOMY     due to endometriosis-1 ovary left   BREAST EXCISIONAL BIOPSY Right 1971   neg   CARDIAC CATHETERIZATION  2000   no significant CAD per note of Dr Caryl Comes in 2013    [place] N/A 03/19/2017   Procedure: ANTERIOR REPAIR (CYSTOCELE);  Surgeon: Bjorn Loser, MD;  Location: [facility];  Service: Urology;  Laterality: N/A;   CYSTOSCOPY N/A 03/19/2017   Procedure: CYSTOSCOPY;  Surgeon: Bjorn Loser, MD;  Location: [facility];  Service: Urology;  Laterality: N/A;   ESOPHAGOGASTRODUODENOSCOPY (EGD) WITH PROPOFOL N/A 12/15/2021   Procedure: ESOPHAGOGASTRODUODENOSCOPY (EGD) WITH PROPOFOL;  Surgeon: Lesly Rubenstein, MD;  Location: [facility];  Service: Endoscopy;  Laterality: N/A;   EYE SURGERY     FOOT SURGERY     HAND SURGERY     HEMICOLECTOMY     INSERTION OF MESH N/A 08/21/2016   Procedure: INSERTION OF MESH;  Surgeon: Excell Seltzer, MD;  Location: [facility];  Service: General;  Laterality: N/A;   INSERTION OF MESH N/A 12/08/2019   Procedure: INSERTION OF MESH;  Surgeon:  Jules Husbands, MD;  Location: [facility];  Service: General;  Laterality: N/A;   LAPAROSCOPIC LYSIS OF ADHESIONS N/A 08/21/2016   Procedure: LAPAROSCOPIC LYSIS OF ADHESIONS;  Surgeon: Excell Seltzer, MD;  Location[facility];  Service: General;  Laterality: N/A;   LAPAROSCOPIC REMOVAL ABDOMINAL MASS     LAPAROTOMY N/A 04/16/2019   Procedure: EXPLORATORY LAPAROTOMY;  Surgeon: Benjamine Sprague, DO;  Location: [facility];  Service: General;  Laterality: N/A;   LOOP RECORDER INSERTION N/A 03/21/2018    Procedure: LOOP RECORDER INSERTION;  Surgeon: Sanda Klein, MD;  Location: [facility];  Service: Cardiovascular;  Laterality: N/A;   LUMBAR DISC SURGERY     ROUX-EN-Y GASTRIC BYPASS  2010   Dr Hassell Done   SMALL INTESTINE SURGERY     TONSILLECTOMY     UPPER GI ENDOSCOPY  01/12/2016   normal larynx, normal esophagus. gastric bypass with a normal-sized pouch and intact staple line, normal examined jejunum, otherwise exam was normal   VENTRAL HERNIA REPAIR N/A 08/21/2016   Procedure: LAPAROSCOPIC VENTRAL HERNIA;  Surgeon: Excell Seltzer, MD;  [facility];  Service: General;  Laterality: N/A;   XI ROBOTIC ASSISTED VENTRAL HERNIA N/A 12/08/2019   Procedure: XI ROBOTIC ASSISTED VENTRAL HERNIA;  Surgeon: Jules Husbands, MD;  Location: [facility];  Service: General;  Laterality: N/A;     Current Meds  Medication Sig   aspirin EC 81 MG EC tablet Take 1 tablet (81 mg total) by mouth daily. Swallow whole.   atorvastatin (LIPITOR) 40 MG tablet Take 1 tablet (40 mg total) by mouth daily.   calcitRIOL (ROCALTROL) 0.25 MCG capsule Take 0.25 mcg by mouth daily.   gabapentin (NEURONTIN) 300 MG capsule Take 1 capsule (300 mg total) by mouth 3 (three) times daily.   hydrOXYzine (VISTARIL) 100 MG capsule Take 1 capsule (100 mg total) by mouth 3 (three) times daily.   linaclotide (LINZESS) 290 MCG CAPS capsule TAKE 1 CAPSULE BY MOUTH EVERY DAY BEFORE BREAKFAST   metFORMIN (GLUCOPHAGE) 500 MG tablet Take 1 tablet (500 mg total) by mouth daily with breakfast.   metoprolol succinate (TOPROL-XL) 25 MG 24 hr tablet Take 1 tablet (25 mg total) by mouth daily.   mirtazapine (REMERON) 15 MG tablet Take 15 mg by mouth at bedtime.   omeprazole (PRILOSEC) 20 MG capsule Take 1 capsule (20 mg total) by mouth daily.   rivastigmine (EXELON) 3 MG capsule Take 3 mg by mouth 2 (two) times daily.   traMADol (ULTRAM) 50 MG tablet Take 1 tablet (50 mg total) by mouth every 6 (six) hours as needed.     Allergies:    Lotrel [amlodipine besy-benazepril hcl], Contrast media [iodinated contrast media], Niacin, and Sulfa antibiotics   Social History   Tobacco Use   Smoking status: Never   Smokeless tobacco: Never  Vaping Use   Vaping Use: Never used  Substance Use Topics   Alcohol use: No   Drug use: No     Family Hx: The patient's family history includes Breast cancer in her paternal grandmother; Breast cancer (age of onset: 42) in her paternal aunt; Breast cancer (age of onset: 82) in her paternal aunt; COPD in her mother; Cancer in her paternal aunt and paternal grandmother; Cancer (age of onset: 58) in her father; Congestive Heart Failure in her maternal grandmother; Coronary artery disease (age of onset: 23) in her father; Diabetes in her mother; Emphysema in her maternal grandfather; Heart attack in her paternal grandfather; Hypertension in her mother.  ROS:   Please see the history of present illness.    All other systems are reviewed and are negative.   Prior CV studies:   The following studies were reviewed today: Labs from PCP  Labs/Other Tests and Data Reviewed:    EKG: Ordered today and personally viewed shows sinus bradycardia but is otherwise a completely normal tracing.  QTc 460 ms.  ECHO 03/19/2018 - Left ventricle: The cavity size was normal. Wall thickness was   normal. Systolic function was normal. The estimated ejection   fraction was in the range of 60% to 65%. Wall motion was normal;   there were no regional wall motion abnormalities.  ECHO 10/27/2018 NORMAL LEFT VENTRICULAR SYSTOLIC FUNCTION WITH AN ESTIMATED EF = 55 %  NORMAL RIGHT VENTRICULAR SYSTOLIC FUNCTION  MILD-TO-MODERATE TRICUSPID VALVE INSUFFICIENCY  MILD MITRAL VALVE INSUFFICIENCY  NO VALVULAR STENOSIS  MILD LA ENLARGEMENT   ECHO 09/06/2021  1. Left ventricular ejection fraction, by estimation, is 55 to 60%. The  left ventricle has normal function. The left ventricle has no regional  wall motion  abnormalities. Left ventricular diastolic parameters were  normal.   2. Right ventricular systolic function is normal. The right ventricular  size is normal.   3. The mitral valve is normal in structure. No evidence of mitral valve  regurgitation.   4. The aortic valve was not well visualized. Aortic valve regurgitation  is not visualized.   5. The inferior vena cava is normal in size with <50% respiratory  variability, suggesting right atrial pressure of 8 mmHg.   Nuclear stress test 10/28/2018 Impression Normal myocardial perfusion scan no evidence of stress-induced  myocardial ischemia ejection fraction of 59% conclusion negative scan  Carotid US 09/06/2021 IMPRESSION: Minor carotid atherosclerosis. Negative for stenosis. Degree of narrowing less than 50% bilaterally by ultrasound criteria.   Patent antegrade vertebral flow bilaterally    Recent Labs: 09/05/2021: ALT 7 09/13/2021: Magnesium 1.8 04/22/2022: BUN 12; Creatinine, Ser 1.46; Potassium 4.0; Sodium 143 06/28/2022: Hemoglobin 11.1; Platelets 284   Recent Lipid Panel Lab Results  Component Value Date/Time   CHOL 134 09/06/2021 04:43 AM   CHOL 163 12/05/2020 08:55 AM   CHOL 154 01/18/2014 06:32 AM   TRIG 54 09/06/2021 04:43 AM   TRIG 53 01/18/2014 06:32 AM   HDL 60 09/06/2021 04:43 AM   HDL 61 12/05/2020 08:55 AM   HDL 74 (H) 01/18/2014 06:32 AM   CHOLHDL 2.2 09/06/2021 04:43 AM   LDLCALC 63 09/06/2021 04:43 AM   LDLCALC 86 12/05/2020 08:55 AM   LDLCALC 69 01/18/2014 06:32 AM    Wt Readings from Last 3 Encounters:  07/19/22 123 lb 9.6 oz (56.1 kg)  06/28/22 125 lb (56.7 kg)  06/13/22 123 lb 3.2 oz (55.9 kg)     Objective:    Vital Signs:  BP 132/66 (BP Location: Left Arm, Patient Position: Sitting, Cuff Size: Normal)   Pulse (!) 56   Ht '5\' 3"'$  (1.6 m)   Wt 123 lb 9.6 oz (56.1 kg)   SpO2 97%   BMI 21.89 kg/m     General: Alert, oriented x3, no distress, very lean. Head: no evidence of trauma, PERRL,  EOMI, no exophtalmos or lid lag, no myxedema, no xanthelasma; normal ears, nose and oropharynx Neck: normal jugular venous pulsations and no hepatojugular reflux; brisk carotid pulses without delay and no carotid bruits Chest: clear to auscultation, no signs of consolidation by percussion or palpation, normal fremitus, symmetrical and full respiratory excursions Cardiovascular: normal position  and quality of the apical impulse, regular rhythm, normal first and second heart sounds, no murmurs, rubs or gallops Abdomen: no tenderness or distention, no masses by palpation, no abnormal pulsatility or arterial bruits, normal bowel sounds, no hepatosplenomegaly Extremities: no clubbing, cyanosis or edema; 2+ radial, ulnar and brachial pulses bilaterally; 2+ right femoral, posterior tibial and dorsalis pedis pulses; 2+ left femoral, posterior tibial and dorsalis pedis pulses; no subclavian or femoral bruits Neurological: grossly nonfocal Psych: Normal mood and affect   ASSESSMENT & PLAN:    1. History of syncope   2. Benign essential HTN   3. Encounter for loop recorder at end of battery life   4. Hypercholesterolemia   5. Stage 3b chronic kidney disease ([place])   6. Type 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, without long-term current use of insulin ([place])   7. Precordial pain       Syncope: No recurrent episodes during 3 years of l implantable oop recorder monitoring (device at end of service).  Suspect the initial event may have been due to hypotension. HTN: Good control.  Symptoms of orthostatic hypotension resolved after stopping her diuretic. ILR: Device no longer working/battery end of service. HLP: All lipid parameters are excellent. CKD 3: Most recent creatinine at baseline 1.46  DM: Very well controlled.  Probably can stop metformin altogether in my opinion. Chest pain: Symptoms have features strongly suggestive of musculoskeletal etiology.     Medication  Adjustments/Labs and Tests Ordered: Current medicines are reviewed at length with the patient today.  Concerns regarding medicines are outlined above.   Patient Instructions  Medication Instructions:  No changes *If you need a refill on your cardiac medications before your next appointment, please call your pharmacy*   Lab Work: None ordered If you have labs (blood work) drawn today and your tests are completely normal, you will receive your results only by: [place] (if you have MyChart) OR A paper copy in the mail If you have any lab test that is abnormal or we need to change your treatment, we will call you to review the results.   Testing/Procedures: None ordered   Follow-Up: At [facility], you and your health needs are our priority.  As part of our continuing mission to provide you with exceptional heart care, we have created designated Provider Care Teams.  These Care Teams include your primary Cardiologist (physician) and Advanced Practice Providers (APPs -  Physician Assistants and Nurse Practitioners) who all work together to provide you with the care you need, when you need it.  We recommend signing up for the patient portal called "MyChart".  Sign up information is provided on this After Visit Summary.  MyChart is used to connect with patients for Virtual Visits (Telemedicine).  Patients are able to view lab/test results, encounter notes, upcoming appointments, etc.  Non-urgent messages can be sent to your provider as well.   To learn more about what you can do with MyChart, go to [email or web address].    Your next appointment:   12 month(s)  The format for your next appointment:   In Person  Provider:   Dr. Sallyanne Kuster  Important Information About Sugar         Tests Ordered: Orders Placed This Encounter  Procedures   EKG 12-Lead   Follow Up:  Virtual Visit or In Person  1 year  Signed, Sanda Klein, MD  07/19/2022 11:56 AM    [place]

## 2022-07-19 NOTE — Telephone Encounter (Signed)
Rx was faxed.

## 2022-07-19 NOTE — Patient Instructions (Signed)
Medication Instructions:  No changes *If you need a refill on your cardiac medications before your next appointment, please call your pharmacy*   Lab Work: None ordered If you have labs (blood work) drawn today and your tests are completely normal, you will receive your results only by: [place] (if you have MyChart) OR A paper copy in the mail If you have any lab test that is abnormal or we need to change your treatment, we will call you to review the results.   Testing/Procedures: None ordered   Follow-Up: At [facility], you and your health needs are our priority.  As part of our continuing mission to provide you with exceptional heart care, we have created designated Provider Care Teams.  These Care Teams include your primary Cardiologist (physician) and Advanced Practice Providers (APPs -  Physician Assistants and Nurse Practitioners) who all work together to provide you with the care you need, when you need it.  We recommend signing up for the patient portal called "MyChart".  Sign up information is provided on this After Visit Summary.  MyChart is used to connect with patients for Virtual Visits (Telemedicine).  Patients are able to view lab/test results, encounter notes, upcoming appointments, etc.  Non-urgent messages can be sent to your provider as well.   To learn more about what you can do with MyChart, go to [email or web address].    Your next appointment:   12 month(s)  The format for your next appointment:   In Person  Provider:   Dr. Sallyanne Kuster  Important Information About Sugar

## 2022-07-20 ENCOUNTER — Ambulatory Visit: Payer: Medicaid Other | Admitting: Cardiovascular Disease

## 2022-07-27 ENCOUNTER — Other Ambulatory Visit: Payer: Self-pay | Admitting: Family Medicine

## 2022-07-27 ENCOUNTER — Other Ambulatory Visit: Payer: Self-pay | Admitting: Dermatology

## 2022-07-27 DIAGNOSIS — L723 Sebaceous cyst: Secondary | ICD-10-CM

## 2022-07-27 NOTE — Telephone Encounter (Signed)
Requested medication (s) are due for refill today: yes  Requested medication (s) are on the active medication list:yes  Last refill:  05/08/22  Future visit scheduled: yes  Notes to clinic:  Unable to refill per protocol, cannot delegate.      Requested Prescriptions  Pending Prescriptions Disp Refills   ondansetron (ZOFRAN) 4 MG tablet [Pharmacy Med Name: ONDANSETRON HCL 4 MG TABLET] 20 tablet 0    Sig: TAKE 1 TABLET BY MOUTH EVERY 8 HOURS AS NEEDED     Not Delegated - Gastroenterology: Antiemetics - ondansetron Failed - 07/27/2022 10:57 AM      Failed - This refill cannot be delegated      Passed - AST in normal range and within 360 days    AST  Date Value Ref Range Status  09/05/2021 17 15 - 41 U/L Final   SGOT(AST)  Date Value Ref Range Status  01/17/2014 20 15 - 37 Unit/L Final         Passed - ALT in normal range and within 360 days    ALT  Date Value Ref Range Status  09/05/2021 7 0 - 44 U/L Final   SGPT (ALT)  Date Value Ref Range Status  01/17/2014 21 12 - 78 U/L Final         Passed - Valid encounter within last 6 months    Recent Outpatient Visits           1 month ago 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, psychotic disturbance, mood disturbance, or anxiety ([place])   [facility] Julie Banana., Julie Acevedo   2 months ago Neck mass   [facility] Julie Banana., Julie Acevedo   5 months ago Type 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, without long-term current use of insulin [facility])   [facility] Julie Banana., Julie Acevedo   8 months ago 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

## 2022-07-30 ENCOUNTER — Ambulatory Visit (INDEPENDENT_AMBULATORY_CARE_PROVIDER_SITE_OTHER): Payer: Medicare Other | Admitting: Dermatology

## 2022-07-30 ENCOUNTER — Encounter: Payer: Self-pay | Admitting: Dermatology

## 2022-07-30 ENCOUNTER — Telehealth: Payer: Self-pay

## 2022-07-30 DIAGNOSIS — L905 Scar conditions and fibrosis of skin: Secondary | ICD-10-CM | POA: Diagnosis not present

## 2022-07-30 NOTE — Progress Notes (Signed)
    Chronic Care Management Pharmacy Assistant   Name: Julie Acevedo  MRN: [id number] DOB: 05-Oct-1951  Patient called to be reminded of her telephone appointment with [name], CPP on 07/30/2022 @ 1100.   No answer, phone just went straight to busy signal unable to LVM.   Star Rating Drug: Atorvastatin 40 mg  last filled on 06/27/2022 for a 87-Day supply with [facility] Metformin 500 mg last filled on 06/27/2022 for a 87-Day supply with [facility]   Any gaps in medications fill history? Yes   Care Gaps: Influenza Vaccine Diabetic Eye Exam Diabetic Foot Exam     Lynann Bologna, CPA/CMA Clinical Pharmacist Assistant Phone: [phone]   Lynann Bologna, [place] Pharmacist Assistant Phone: [phone]

## 2022-07-30 NOTE — Patient Instructions (Signed)
Due to recent changes in healthcare laws, you may see results of your pathology and/or laboratory studies on MyChart before the doctors have had a chance to review them. We understand that in some cases there may be results that are confusing or concerning to you. Please understand that not all results are received at the same time and often the doctors may need to interpret multiple results in order to provide you with the best plan of care or course of treatment. Therefore, we ask that you please give us 2 business days to thoroughly review all your results before contacting the office for clarification. Should we see a critical lab result, you will be contacted sooner.   If You Need Anything After Your Visit  If you have any questions or concerns for your doctor, please call our main line at [phone] and press option 4 to reach your doctor's medical assistant. If no one answers, please leave a voicemail as directed and we will return your call as soon as possible. Messages left after 4 pm will be answered the following business day.   You may also send us a message via MyChart. We typically respond to MyChart messages within 1-2 business days.  For prescription refills, please ask your pharmacy to contact our office. Our fax number is [phone].  If you have an urgent issue when the clinic is closed that cannot wait until the next business day, you can page your doctor at the number below.    Please note that while we do our best to be available for urgent issues outside of office hours, we are not available 24/7.   If you have an urgent issue and are unable to reach us, you may choose to seek medical care at your doctor's office, retail clinic, urgent care center, or emergency room.  If you have a medical emergency, please immediately call 911 or go to the emergency department.  Pager Numbers  - Dr. Kowalski: [phone]  - Dr. Moye: [phone]  - Dr. Stewart:  [phone]  In the event of inclement weather, please call our main line at [phone] for an update on the status of any delays or closures.  Dermatology Medication Tips: Please keep the boxes that topical medications come in in order to help keep track of the instructions about where and how to use these. Pharmacies typically print the medication instructions only on the boxes and not directly on the medication tubes.   If your medication is too expensive, please contact our office at [phone] option 4 or send us a message through MyChart.   We are unable to tell what your co-pay for medications will be in advance as this is different depending on your insurance coverage. However, we may be able to find a substitute medication at lower cost or fill out paperwork to get insurance to cover a needed medication.   If a prior authorization is required to get your medication covered by your insurance company, please allow us 1-2 business days to complete this process.  Drug prices often vary depending on where the prescription is filled and some pharmacies may offer cheaper prices.  The website www.goodrx.com contains coupons for medications through different pharmacies. The prices here do not account for what the cost may be with help from insurance (it may be cheaper with your insurance), but the website can give you the price if you did not use any insurance.  - You can print the associated coupon and take it with   your prescription to the pharmacy.  - You may also stop by our office during regular business hours and pick up a GoodRx coupon card.  - If you need your prescription sent electronically to a different pharmacy, notify our office through [place] MyChart or by phone at [phone] option 4.     Si Usted Necesita Algo Despus de Su Visita  Tambin puede enviarnos un mensaje a travs de MyChart. Por lo general respondemos a los mensajes de MyChart en el transcurso de 1 a 2  das hbiles.  Para renovar recetas, por favor pida a su farmacia que se ponga en contacto con nuestra oficina. Nuestro nmero de fax es el [phone].  Si tiene un asunto urgente cuando la clnica est cerrada y que no puede esperar hasta el siguiente da hbil, puede llamar/localizar a su doctor(a) al nmero que aparece a continuacin.   Por favor, tenga en cuenta que aunque hacemos todo lo posible para estar disponibles para asuntos urgentes fuera del horario de oficina, no estamos disponibles las 24 horas del da, los 7 das de la semana.   Si tiene un problema urgente y no puede comunicarse con nosotros, puede optar por buscar atencin mdica  en el consultorio de su doctor(a), en una clnica privada, en un centro de atencin urgente o en una sala de emergencias.  Si tiene una emergencia mdica, por favor llame inmediatamente al 911 o vaya a la sala de emergencias.  Nmeros de bper  - Dr. Kowalski: [phone]  - Dra. Moye: [phone]  - Dra. Stewart: [phone]  En caso de inclemencias del tiempo, por favor llame a nuestra lnea principal al [phone] para una actualizacin sobre el estado de cualquier retraso o cierre.  Consejos para la medicacin en dermatologa: Por favor, guarde las cajas en las que vienen los medicamentos de uso tpico para ayudarle a seguir las instrucciones sobre dnde y cmo usarlos. Las farmacias generalmente imprimen las instrucciones del medicamento slo en las cajas y no directamente en los tubos del medicamento.   Si su medicamento es muy caro, por favor, pngase en contacto con nuestra oficina llamando al [phone] y presione la opcin 4 o envenos un mensaje a travs de MyChart.   No podemos decirle cul ser su copago por los medicamentos por adelantado ya que esto es diferente dependiendo de la cobertura de su seguro. Sin embargo, es posible que podamos encontrar un medicamento sustituto a menor costo o llenar un formulario para que el  seguro cubra el medicamento que se considera necesario.   Si se requiere una autorizacin previa para que su compaa de seguros cubra su medicamento, por favor permtanos de 1 a 2 das hbiles para completar este proceso.  Los precios de los medicamentos varan con frecuencia dependiendo del lugar de dnde se surte la receta y alguna farmacias pueden ofrecer precios ms baratos.  El sitio web www.goodrx.com tiene cupones para medicamentos de diferentes farmacias. Los precios aqu no tienen en cuenta lo que podra costar con la ayuda del seguro (puede ser ms barato con su seguro), pero el sitio web puede darle el precio si no utiliz ningn seguro.  - Puede imprimir el cupn correspondiente y llevarlo con su receta a la farmacia.  - Tambin puede pasar por nuestra oficina durante el horario de atencin regular y recoger una tarjeta de cupones de GoodRx.  - Si necesita que su receta se enve electrnicamente a una farmacia diferente, informe a nuestra oficina a travs de MyChart de [facility]   o por telfono llamando al [phone] y presione la opcin 4.  

## 2022-07-30 NOTE — Progress Notes (Unsigned)
   Follow-Up Visit   Subjective  Julie Acevedo is a 71 y.o. female who presents for the following: Cyst (2 month recheck. Took doxycycline and used Mupirocin as directed. Posterior neck. Greatly improved, per patient ).  The following portions of the chart were reviewed this encounter and updated as appropriate:  Tobacco  Allergies  Meds  Problems  Med Hx  Surg Hx  Fam Hx     Review of Systems: No other skin or systemic complaints except as noted in HPI or Assessment and Plan.  Objective  Well appearing patient in no apparent distress; mood and affect are within normal limits.  A focused examination was performed including face, neck. Relevant physical exam findings are noted in the Assessment and Plan.  Right Posterior Neck Hyperpigmented smooth macule or patch.    Assessment & Plan   Scar at site of inflamed ruptured cyst of the right posterior neck. Now resolved. Right Posterior Neck Watch for recurrence.  If recurrence consider surgery.  Return if symptoms worsen or fail to improve.  I, Emelia Salisbury, CMA, am acting as scribe for Sarina Ser, MD. Documentation: I have reviewed the above documentation for accuracy and completeness, and I agree with the above.  Sarina Ser, MD

## 2022-07-31 ENCOUNTER — Encounter: Payer: Self-pay | Admitting: Dermatology

## 2022-07-31 ENCOUNTER — Telehealth: Payer: Medicare Other

## 2022-07-31 NOTE — Progress Notes (Deleted)
Chronic Care Management Pharmacy Note  07/31/2022 Name:  Julie Acevedo MRN:  [id number] DOB:  1951/09/05  Summary: Patient presents for CCM follow-up.   -Home blood pressures elevated in the 150s, one instance of 160s. Has noticed a mild headache more frequently.   -She has multiple medications that are overdue to be filled and has been out of atorvastatin and metformin for some time.  Recommendations/Changes made from today's visit: -Continue current medications for now, counseled patient to cut down on caffeine intake, continue sodium restriction and monitor blood pressure daily.   -HC to check on home and nurse visit blood pressures this Friday.   -Patient onboarded to [facility], refills of atorvastatin and metformin sent.   Plan: HC BP assessment in <1 week  CPP follow-up 1 month  Subjective: Julie Acevedo is an 71 y.o. year old female who is a primary patient of Julie Acevedo., MD.  The CCM team was consulted for assistance with disease management and care coordination needs.    Engaged with patient by telephone for follow up visit in response to provider referral for pharmacy case management and/or care coordination services.   Consent to Services:  The patient was given information about Chronic Care Management services, agreed to services, and gave verbal consent prior to initiation of services.  Please see initial visit note for detailed documentation.   Patient Care Team: Julie Acevedo., MD as PCP - General (Family Medicine) Julie Kida, MD as PCP - Cardiology (Cardiology) Julie Acevedo., MD as Consulting Physician (Ophthalmology) Julie Loser, MD as Consulting Physician (Urology) Julie Acevedo, Julie Gleason, MD as Consulting Physician (General Surgery) Julie Husbands, MD as Consulting Physician (General Surgery) Julie Acevedo, DPM as Consulting Physician (Podiatry) Julie Dry, MD as Referring Physician (Obstetrics and  Gynecology) Julie Crofts, MD as Consulting Physician (Neurology) Julie Acevedo, OT as Occupational Therapist (Occupational Therapy) Julie Huger, MD as Consulting Physician (Oncology)  Recent office visits: 06/13/22: Patient presented to Dr. Rosanna Randy for follow-up. Stopped trazodone.   05/01/22: Patient presented to Dr. Rosanna Randy for follow-up.   Recent consult visits: 07/19/22: Patient presented to Dr. Sallyanne Kuster (cardiology) for follow-up.  05/24/22: Patient presented to Dr. Lanora Manis (Nephrology) for follow-up.  05/04/22: Patient presented to Dr. [name] (Neurology) for follow-up. Stop trazodone, start mirtazapine 15 mg nightly.   Hospital visits: 04/22/22: Patient presented to ED for chest pain.   Objective:  Lab Results  Component Value Date   CREATININE 1.46 (H) 04/22/2022   BUN 12 04/22/2022   GFRNONAA 39 (L) 04/22/2022   GFRAA 42 (L) 12/05/2020   NA 143 04/22/2022   K 4.0 04/22/2022   CALCIUM 9.1 04/22/2022   CO2 29 04/22/2022    Lab Results  Component Value Date/Time   HGBA1C 5.1 02/13/2022 02:36 PM   HGBA1C 5.6 09/06/2021 04:43 AM   HGBA1C 5.3 08/29/2021 09:03 AM   HGBA1C 5.5 03/06/2021 04:26 PM   HGBA1C 6.3 01/18/2014 06:32 AM   MICROALBUR 20 06/23/2020 02:10 PM   MICROALBUR 50 11/18/2017 09:33 AM    Last diabetic Eye exam:  Lab Results  Component Value Date/Time   HMDIABEYEEXA No Retinopathy 12/03/2019 12:00 AM    Last diabetic Foot exam: No results found for: "HMDIABFOOTEX"   Lab Results  Component Value Date   CHOL 134 09/06/2021   HDL 60 09/06/2021   LDLCALC 63 09/06/2021   TRIG 54 09/06/2021   CHOLHDL 2.2 09/06/2021       Latest  Ref Rng & Units 09/13/2021    2:33 PM 09/05/2021    2:14 PM 06/19/2021   11:49 PM  Hepatic Function  Total Protein 6.5 - 8.1 g/dL  6.4  6.6   Albumin 3.8 - 4.8 g/dL 3.9  3.3  3.4   AST 15 - 41 U/L  17  13   ALT 0 - 44 U/L  7  8   Alk Phosphatase 38 - 126 U/L  152  168   Total Bilirubin 0.3 - 1.2 mg/dL  0.6  0.4      Lab Results  Component Value Date/Time   TSH 1.896 06/28/2021 01:23 PM   TSH 3.730 12/05/2020 08:55 AM   TSH 2.170 08/18/2020 11:36 AM   FREET4 0.91 06/28/2021 01:23 PM       Latest Ref Rng & Units 06/28/2022    1:58 PM 04/22/2022    1:16 AM 11/30/2021   12:51 PM  CBC  WBC 4.0 - 10.5 K/uL 7.4  6.5  7.3   Hemoglobin 12.0 - 15.0 g/dL 11.1  10.6  10.5   Hematocrit 36.0 - 46.0 % 35.3  34.0  33.3   Platelets 150 - 400 K/uL 284  245  285     Lab Results  Component Value Date/Time   VD25OH 8.7 (L) 06/16/2019 03:51 PM    Clinical ASCVD: Yes  The ASCVD Risk score (Arnett DK, et al., 2019) failed to calculate for the following reasons:   The patient has a prior MI or stroke diagnosis       04/24/2022    1:17 PM 03/28/2022    8:30 AM 12/26/2021   11:13 AM  Depression screen PHQ 2/9  Decreased Interest 0  3  Down, Depressed, Hopeless 0 0 0  PHQ - 2 Score 0 0 3  Altered sleeping   0  Tired, decreased energy   0  Change in appetite   0  Feeling bad or failure about yourself    0  Trouble concentrating   0  Moving slowly or fidgety/restless   3  Suicidal thoughts   0  PHQ-9 Score   6  Difficult doing work/chores   Very difficult    Social History   Tobacco Use  Smoking Status Never  Smokeless Tobacco Never   BP Readings from Last 3 Encounters:  07/19/22 132/66  06/28/22 (!) 151/73  06/25/22 (!) 143/83   Pulse Readings from Last 3 Encounters:  07/19/22 (!) 56  06/28/22 (!) 59  06/25/22 60   Wt Readings from Last 3 Encounters:  07/19/22 123 lb 9.6 oz (56.1 kg)  06/28/22 125 lb (56.7 kg)  06/13/22 123 lb 3.2 oz (55.9 kg)    Assessment/Interventions: Review of patient past medical history, allergies, medications, health status, including review of consultants reports, laboratory and other test data, was performed as part of comprehensive evaluation and provision of chronic care management services.   SDOH:  (Social Determinants of Health) assessments and  interventions performed: Yes      CCM Care Plan  Allergies  Allergen Reactions   Lotrel [Amlodipine Besy-Benazepril Hcl] Anaphylaxis   Contrast Media [Iodinated Contrast Media] Swelling and Other (See Comments)    Reaction:  Neck swelling    Niacin Hives   Sulfa Antibiotics Hives    Medications Reviewed Today     Reviewed by Emelia Salisbury, CMA (Certified Medical Assistant) on 07/30/22 at [phone]  Med List Status: <None>   Medication Order Taking? Sig Documenting Provider Last Dose Status Informant  aspirin EC 81 MG EC tablet [id number] No Take 1 tablet (81 mg total) by mouth daily. Swallow whole. Pokhrel, Laxman, MD Taking Active   atorvastatin (LIPITOR) 40 MG tablet [id number] No Take 1 tablet (40 mg total) by mouth daily. Julie Acevedo., MD Taking Active   calcitRIOL (ROCALTROL) 0.25 MCG capsule [id number] No Take 0.25 mcg by mouth daily. [provider] Taking Active Pharmacy Records  cyclobenzaprine (FLEXERIL) 5 MG tablet [id number] No TAKE 1 TABLET BY MOUTH THREE TIMES A DAY AS NEEDED  Patient not taking: Reported on 07/19/2022   Julie Acevedo., MD Not Taking Active   diclofenac Sodium (VOLTAREN) 1 % GEL [id number] No   Patient not taking: Reported on 07/19/2022   [provider] Not Taking Active   gabapentin (NEURONTIN) 300 MG capsule [id number] No Take 1 capsule (300 mg total) by mouth 3 (three) times daily. Julie Acevedo., MD Taking Active   hydrOXYzine (VISTARIL) 100 MG capsule [id number] No Take 1 capsule (100 mg total) by mouth 3 (three) times daily. Julie Acevedo., MD Taking Active   linaclotide [facility]) 290 MCG CAPS capsule [id number] No TAKE 1 CAPSULE BY MOUTH EVERY DAY BEFORE BREAKFAST Julie Acevedo., MD Taking Active   metFORMIN (GLUCOPHAGE) 500 MG tablet [id number] No Take 1 tablet (500 mg total) by mouth daily with breakfast. Julie Acevedo., MD Taking Active   metoprolol succinate (TOPROL-XL) 25 MG 24 hr tablet  [id number] No Take 1 tablet (25 mg total) by mouth daily. Julie Acevedo., MD Taking Active   mirtazapine (REMERON) 15 MG tablet [id number] No Take 15 mg by mouth at bedtime. [provider] Taking Active   mupirocin ointment (BACTROBAN) 2 % [id number] No Apply to aa BID until healed.  Patient not taking: Reported on 07/19/2022   Ralene Bathe, MD Not Taking Active   omeprazole (PRILOSEC) 20 MG capsule [id number] No Take 1 capsule (20 mg total) by mouth daily. Julie Acevedo., MD Taking Active   ondansetron [facility]) 4 MG tablet [id number] No Take 1 tablet (4 mg total) by mouth every 8 (eight) hours as needed.  Patient not taking: Reported on 07/19/2022   Julie Acevedo., MD Not Taking Active   oxyCODONE-acetaminophen (PERCOCET) 5-325 MG tablet [id number] No Take 1 tablet by mouth every 4 (four) hours as needed for severe pain.  Patient not taking: Reported on 07/19/2022   Felipa Furnace, DPM Not Taking Active   rivastigmine (EXELON) 3 MG capsule [id number] No Take 3 mg by mouth 2 (two) times daily. [provider] Taking Active Pharmacy Records  traMADol (ULTRAM) 50 MG tablet [id number] No Take 1 tablet (50 mg total) by mouth every 6 (six) hours as needed. 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, psychotic disturbance, mood disturbance, or anxiety ([place]) 11/22/2021   TIA (transient ischemic attack) 09/05/2021   Stomatitis and mucositis 07/21/2021   Cheilitis 07/21/2021   Proteinuria, unspecified 05/04/2021   Stage 3b chronic kidney disease ([place]) 05/04/2021   Anemia in chronic kidney disease 05/04/2021   Hypokalemia 05/04/2021   Other and unspecified hyperlipidemia  03/17/2021   Callus of foot 01/19/2021   Pes planus 01/19/2021   PTTD (posterior tibial tendon dysfunction) 01/19/2021   Acute flank pain 12/15/2020   Cystitis 12/15/2020   Urinary symptom or sign 12/15/2020   Low back pain radiating to right lower extremity 06/23/2020   Gastroesophageal reflux disease 04/28/2020   Large bowel obstruction ([place]) 04/17/2019   Acquired trigger finger 03/20/2019   Iron deficiency anemia 11/21/2018   Syncope 03/18/2018   Anemia 03/18/2018   Exophthalmos 02/22/2018   Multiple thyroid nodules 02/22/2018   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   Pain at surgical site 08/30/2016   Ventral incisional hernia 08/21/2016   SBO (small bowel obstruction) (HCC)    Partial small bowel obstruction ([place]) 04/23/2016   Adjustment disorder with mixed anxiety and depressed mood 04/23/2016   Memory difficulties 09/01/2015   Arthritis 06/11/2015   Back pain, chronic 06/11/2015   HLD (hyperlipidemia) 06/11/2015   Cannot sleep 06/11/2015   L-S radiculopathy 06/11/2015   Arthritis of knee, degenerative 06/11/2015   B12 deficiency 06/11/2015   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   Type 2 diabetes mellitus ([place]) 11/15/2012   Avitaminosis D 11/07/2009   Narrowing of intervertebral disc space 07/25/2009   Benign essential HTN 07/25/2009    Immunization History  Administered Date(s) Administered   Fluad Quad(high Dose 65+) 08/15/2019, 09/01/2020, 09/13/2021   Influenza Split 10/17/2010, 10/02/2011, 08/27/2012   Influenza, High Dose Seasonal PF 08/29/2018   Influenza,inj,Quad PF,6+ Mos 09/26/2013, 09/06/2014, 09/06/2016   Influenza,inj,Quad PF,6-35 Mos 08/31/2017   Influenza-Unspecified 09/15/2015   PFIZER Comirnaty(Gray Top)Covid-19 Tri-Sucrose Vaccine 04/05/2021   PFIZER(Purple Top)SARS-COV-2 Vaccination 01/29/2020, 02/23/2020, 10/11/2020   PNEUMOCOCCAL CONJUGATE-20 06/24/2021   Pneumococcal Conjugate-13  08/29/2018   Pneumococcal Polysaccharide-23 11/17/2012   Tdap 10/02/2011, 09/27/2016   Zoster Recombinat (Shingrix) 08/29/2018, 08/15/2019   Zoster, Live 09/06/2016    Conditions to be addressed/monitored:  Hypertension, Hyperlipidemia, Diabetes, GERD and Chronic Pain  There are no care plans that you recently modified to display for this patient.    Medication Assistance: None required.  Patient affirms current coverage meets needs.  Compliance/Adherence/Medication fill history: Care Gaps: Diabetic Eye Exam Urine Microalbumin COVID-19 Vaccine Booster 5  Star-Rating Drugs: Atorvastatin 40 mg ast filled on 09/08/2021 for a 90-Day supply Metformin 500 mg last filled on 07/16/2021 for a 90-Day supply with [facility]  Patient's preferred pharmacy is:  [facility] - [place], [place] - [address] [place] [address] Phone: [phone] Fax: [phone]  [id number] [place] [address]: [phone]: [phone] Uses pill box? No Pt endorses 50% compliance  We discussed: Verbal consent obtained for [facility] enhanced pharmacy services (medication synchronization, adherence packaging, delivery coordination). A medication sync plan was created to allow patient to get all medications delivered once every 30 to 90 days per patient preference. Patient understands they have freedom to choose pharmacy and clinical pharmacist will coordinate care between all prescribers and [facility].  Patient decided to: Utilize [facility] for medication synchronization, packaging and delivery  Care Plan and Follow Up Patient Decision:  Patient agrees to Care Plan and Follow-up.  Plan: Telephone follow up appointment with care management team member scheduled for:  07/31/2022 at 11:00 AM  Current Barriers:  Unable to achieve control of  cholesterol  Suboptimal therapeutic regimen for hypertension  Pharmacist Clinical Goal(s):  patient  will achieve control of cholesterol as evidenced by LDL less than 70 maintain control of blood pressure as evidenced by BP less than 140/90 and improvement of dizziness symptoms  through collaboration with PharmD and provider.   Interventions: 1:1 collaboration with Julie Acevedo., MD regarding development and update of comprehensive plan of care as evidenced by provider attestation and co-signature Inter-disciplinary care team collaboration (see longitudinal plan of care) Comprehensive medication review performed; medication list updated in electronic medical record  Hypertension (BP goal <140/90) -Not ideally controlled -Current treatment:   Metoprolol XL 25 mg daily  -Medications previously tried: HCTZ (Hypotension)  -Current home readings: 158/71, 156s. Consistently in the 150s-160s for the past 3 weeks. Pulse 60s-80s. Blood pressure machine is 69 years old.  -Reports mild headache.  -Current dietary habits: Denies any recent changes in sodium of caffeine intake. Does report 3 glasses of tea and a few sips of Pepsi daily, which is her baseline.  -Current exercise habits: Working in garden.  -Continue current medications for now, counseled patient to cut down on caffeine intake, continue sodium restriction and monitor blood pressure daily.  -HC to check on home and nurse visit blood pressures this Friday.   Hyperlipidemia: (LDL goal < 70) -History of MI 1999 -History of TIA Sep 2022 -Uncontrolled -Current treatment: Atorvastatin 40 mg daily -Medications previously tried: NA  -She has been out of atorvastatin and metformin for sometime.  -Continue current medications   Diabetes (A1c goal <7%) -Controlled -Current medications: Metformin 500 mg daily  -Medications previously tried: NA  -Current home glucose readings fasting glucose:   -Denies hypoglycemic/hyperglycemic  symptoms -Continue current medications  Dementia (Goal: Prevent further decline in memory) -Managed by Dr. [name] -Uncontrolled -Current treatment  Rivastigmine 3 mg twice daily  -Medications previously tried: NA -Continue current medications  Patient Goals/Self-Care Activities patient will:  - check blood pressure 2-3 times weekly, document, and provide at future appointments target a minimum of 150 minutes of moderate intensity exercise weekly  Follow Up Plan: Telephone follow up appointment with care management team member scheduled for:  07/31/2022 at 11:00 AM

## 2022-08-02 DIAGNOSIS — E1165 Type 2 diabetes mellitus with hyperglycemia: Secondary | ICD-10-CM | POA: Diagnosis not present

## 2022-08-07 DIAGNOSIS — E162 Hypoglycemia, unspecified: Secondary | ICD-10-CM | POA: Diagnosis not present

## 2022-08-07 DIAGNOSIS — R410 Disorientation, unspecified: Secondary | ICD-10-CM | POA: Diagnosis not present

## 2022-08-07 DIAGNOSIS — E1165 Type 2 diabetes mellitus with hyperglycemia: Secondary | ICD-10-CM | POA: Diagnosis not present

## 2022-08-07 DIAGNOSIS — R404 Transient alteration of awareness: Secondary | ICD-10-CM | POA: Diagnosis not present

## 2022-08-07 DIAGNOSIS — R55 Syncope and collapse: Secondary | ICD-10-CM | POA: Diagnosis not present

## 2022-08-08 ENCOUNTER — Observation Stay (HOSPITAL_COMMUNITY): Payer: Medicare Other

## 2022-08-08 ENCOUNTER — Other Ambulatory Visit: Payer: Self-pay

## 2022-08-08 ENCOUNTER — Inpatient Hospital Stay (HOSPITAL_COMMUNITY)
Admission: EM | Admit: 2022-08-08 | Discharge: 2022-08-14 | DRG: 638 | Disposition: A | Discharge status: Home / Self Care | Payer: Medicare Other | Attending: Internal Medicine | Admitting: Internal Medicine

## 2022-08-08 ENCOUNTER — Encounter (HOSPITAL_COMMUNITY): Payer: Self-pay | Admitting: Emergency Medicine

## 2022-08-08 DIAGNOSIS — K219 Gastro-esophageal reflux disease without esophagitis: Secondary | ICD-10-CM | POA: Diagnosis not present

## 2022-08-08 DIAGNOSIS — Z8616 Personal history of COVID-19: Secondary | ICD-10-CM | POA: Diagnosis not present

## 2022-08-08 DIAGNOSIS — Z825 Family history of asthma and other chronic lower respiratory diseases: Secondary | ICD-10-CM

## 2022-08-08 DIAGNOSIS — E785 Hyperlipidemia, unspecified: Secondary | ICD-10-CM | POA: Diagnosis not present

## 2022-08-08 DIAGNOSIS — N390 Urinary tract infection, site not specified: Secondary | ICD-10-CM | POA: Diagnosis not present

## 2022-08-08 DIAGNOSIS — I252 Old myocardial infarction: Secondary | ICD-10-CM | POA: Diagnosis not present

## 2022-08-08 DIAGNOSIS — N1832 Chronic kidney disease, stage 3b: Secondary | ICD-10-CM | POA: Diagnosis present

## 2022-08-08 DIAGNOSIS — Y838 Other surgical procedures as the cause of abnormal reaction of the patient, or of later complication, without mention of misadventure at the time of the procedure: Secondary | ICD-10-CM | POA: Diagnosis present

## 2022-08-08 DIAGNOSIS — Z833 Family history of diabetes mellitus: Secondary | ICD-10-CM

## 2022-08-08 DIAGNOSIS — I129 Hypertensive chronic kidney disease with stage 1 through stage 4 chronic kidney disease, or unspecified chronic kidney disease: Secondary | ICD-10-CM | POA: Diagnosis not present

## 2022-08-08 DIAGNOSIS — N3000 Acute cystitis without hematuria: Secondary | ICD-10-CM

## 2022-08-08 DIAGNOSIS — E11649 Type 2 diabetes mellitus with hypoglycemia without coma: Principal | ICD-10-CM | POA: Diagnosis present

## 2022-08-08 DIAGNOSIS — I10 Essential (primary) hypertension: Secondary | ICD-10-CM | POA: Diagnosis not present

## 2022-08-08 DIAGNOSIS — D631 Anemia in chronic kidney disease: Secondary | ICD-10-CM | POA: Diagnosis present

## 2022-08-08 DIAGNOSIS — E440 Moderate protein-calorie malnutrition: Secondary | ICD-10-CM | POA: Insufficient documentation

## 2022-08-08 DIAGNOSIS — R7401 Elevation of levels of liver transaminase levels: Secondary | ICD-10-CM | POA: Diagnosis present

## 2022-08-08 DIAGNOSIS — E1122 Type 2 diabetes mellitus with diabetic chronic kidney disease: Secondary | ICD-10-CM | POA: Diagnosis not present

## 2022-08-08 DIAGNOSIS — Z8249 Family history of ischemic heart disease and other diseases of the circulatory system: Secondary | ICD-10-CM

## 2022-08-08 DIAGNOSIS — Z9884 Bariatric surgery status: Secondary | ICD-10-CM

## 2022-08-08 DIAGNOSIS — F0280 Dementia in other diseases classified elsewhere without behavioral disturbance: Secondary | ICD-10-CM | POA: Diagnosis present

## 2022-08-08 DIAGNOSIS — G3183 Dementia with Lewy bodies: Secondary | ICD-10-CM | POA: Diagnosis not present

## 2022-08-08 DIAGNOSIS — I2510 Atherosclerotic heart disease of native coronary artery without angina pectoris: Secondary | ICD-10-CM | POA: Diagnosis present

## 2022-08-08 DIAGNOSIS — E876 Hypokalemia: Secondary | ICD-10-CM | POA: Diagnosis not present

## 2022-08-08 DIAGNOSIS — K9589 Other complications of other bariatric procedure: Secondary | ICD-10-CM | POA: Diagnosis present

## 2022-08-08 DIAGNOSIS — R748 Abnormal levels of other serum enzymes: Secondary | ICD-10-CM | POA: Diagnosis present

## 2022-08-08 DIAGNOSIS — K912 Postsurgical malabsorption, not elsewhere classified: Secondary | ICD-10-CM | POA: Diagnosis not present

## 2022-08-08 DIAGNOSIS — R1030 Lower abdominal pain, unspecified: Secondary | ICD-10-CM | POA: Diagnosis present

## 2022-08-08 DIAGNOSIS — Z7984 Long term (current) use of oral hypoglycemic drugs: Secondary | ICD-10-CM

## 2022-08-08 DIAGNOSIS — R197 Diarrhea, unspecified: Secondary | ICD-10-CM | POA: Diagnosis present

## 2022-08-08 DIAGNOSIS — Z7982 Long term (current) use of aspirin: Secondary | ICD-10-CM

## 2022-08-08 DIAGNOSIS — Z6821 Body mass index (BMI) 21.0-21.9, adult: Secondary | ICD-10-CM | POA: Diagnosis not present

## 2022-08-08 DIAGNOSIS — E162 Hypoglycemia, unspecified: Secondary | ICD-10-CM | POA: Diagnosis present

## 2022-08-08 DIAGNOSIS — F02A0 Dementia in other diseases classified elsewhere, mild, without behavioral disturbance, psychotic disturbance, mood disturbance, and anxiety: Secondary | ICD-10-CM | POA: Diagnosis not present

## 2022-08-08 DIAGNOSIS — R4182 Altered mental status, unspecified: Secondary | ICD-10-CM | POA: Diagnosis not present

## 2022-08-08 DIAGNOSIS — Z882 Allergy status to sulfonamides status: Secondary | ICD-10-CM

## 2022-08-08 DIAGNOSIS — M47812 Spondylosis without myelopathy or radiculopathy, cervical region: Secondary | ICD-10-CM | POA: Diagnosis not present

## 2022-08-08 DIAGNOSIS — Z888 Allergy status to other drugs, medicaments and biological substances status: Secondary | ICD-10-CM

## 2022-08-08 DIAGNOSIS — Z91041 Radiographic dye allergy status: Secondary | ICD-10-CM

## 2022-08-08 DIAGNOSIS — E8809 Other disorders of plasma-protein metabolism, not elsewhere classified: Secondary | ICD-10-CM | POA: Diagnosis present

## 2022-08-08 DIAGNOSIS — Y738 Miscellaneous gastroenterology and urology devices associated with adverse incidents, not elsewhere classified: Secondary | ICD-10-CM | POA: Diagnosis present

## 2022-08-08 DIAGNOSIS — Z79899 Other long term (current) drug therapy: Secondary | ICD-10-CM

## 2022-08-08 DIAGNOSIS — M542 Cervicalgia: Secondary | ICD-10-CM | POA: Diagnosis not present

## 2022-08-08 HISTORY — DX: Lower abdominal pain, unspecified: R10.30

## 2022-08-08 LAB — CBG MONITORING, ED
Glucose-Capillary: 111 mg/dL — ABNORMAL HIGH (ref 70–99)
Glucose-Capillary: 116 mg/dL — ABNORMAL HIGH (ref 70–99)
Glucose-Capillary: 129 mg/dL — ABNORMAL HIGH (ref 70–99)
Glucose-Capillary: 146 mg/dL — ABNORMAL HIGH (ref 70–99)
Glucose-Capillary: 164 mg/dL — ABNORMAL HIGH (ref 70–99)
Glucose-Capillary: 60 mg/dL — ABNORMAL LOW (ref 70–99)
Glucose-Capillary: 69 mg/dL — ABNORMAL LOW (ref 70–99)
Glucose-Capillary: 71 mg/dL (ref 70–99)
Glucose-Capillary: 74 mg/dL (ref 70–99)
Glucose-Capillary: 75 mg/dL (ref 70–99)
Glucose-Capillary: 76 mg/dL (ref 70–99)
Glucose-Capillary: 84 mg/dL (ref 70–99)

## 2022-08-08 LAB — URINALYSIS, ROUTINE W REFLEX MICROSCOPIC
Bacteria, UA: NONE SEEN
Bilirubin Urine: NEGATIVE
Glucose, UA: 50 mg/dL — AB
Hgb urine dipstick: NEGATIVE
Ketones, ur: NEGATIVE mg/dL
Nitrite: NEGATIVE
Protein, ur: NEGATIVE mg/dL
Specific Gravity, Urine: 1.015 (ref 1.005–1.030)
pH: 6 (ref 5.0–8.0)

## 2022-08-08 LAB — COMPREHENSIVE METABOLIC PANEL
ALT: 45 U/L — ABNORMAL HIGH (ref 0–44)
AST: 43 U/L — ABNORMAL HIGH (ref 15–41)
Albumin: 3.2 g/dL — ABNORMAL LOW (ref 3.5–5.0)
Alkaline Phosphatase: 123 U/L (ref 38–126)
Anion gap: 5 (ref 5–15)
BUN: 14 mg/dL (ref 8–23)
CO2: 29 mmol/L (ref 22–32)
Calcium: 9.2 mg/dL (ref 8.9–10.3)
Chloride: 107 mmol/L (ref 98–111)
Creatinine, Ser: 1.37 mg/dL — ABNORMAL HIGH (ref 0.44–1.00)
GFR, Estimated: 42 mL/min — ABNORMAL LOW (ref >60)
Glucose, Bld: 113 mg/dL — ABNORMAL HIGH (ref 70–99)
Potassium: 3.3 mmol/L — ABNORMAL LOW (ref 3.5–5.1)
Sodium: 141 mmol/L (ref 135–145)
Total Bilirubin: 0.2 mg/dL — ABNORMAL LOW (ref 0.3–1.2)
Total Protein: 6 g/dL — ABNORMAL LOW (ref 6.5–8.1)

## 2022-08-08 LAB — MAGNESIUM: Magnesium: 1.7 mg/dL (ref 1.7–2.4)

## 2022-08-08 LAB — CBC WITH DIFFERENTIAL/PLATELET
Abs Immature Granulocytes: 0.02 10*3/uL (ref 0.00–0.07)
Basophils Absolute: 0 10*3/uL (ref 0.0–0.1)
Basophils Relative: 0 %
Eosinophils Absolute: 0.1 10*3/uL (ref 0.0–0.5)
Eosinophils Relative: 1 %
HCT: 32.5 % — ABNORMAL LOW (ref 36.0–46.0)
Hemoglobin: 10.2 g/dL — ABNORMAL LOW (ref 12.0–15.0)
Immature Granulocytes: 0 %
Lymphocytes Relative: 21 %
Lymphs Abs: 1.6 10*3/uL (ref 0.7–4.0)
MCH: 27.2 pg (ref 26.0–34.0)
MCHC: 31.4 g/dL (ref 30.0–36.0)
MCV: 86.7 fL (ref 80.0–100.0)
Monocytes Absolute: 0.7 10*3/uL (ref 0.1–1.0)
Monocytes Relative: 9 %
Neutro Abs: 5.5 10*3/uL (ref 1.7–7.7)
Neutrophils Relative %: 69 %
Platelets: 224 10*3/uL (ref 150–400)
RBC: 3.75 MIL/uL — ABNORMAL LOW (ref 3.87–5.11)
RDW: 13.7 % (ref 11.5–15.5)
WBC: 7.9 10*3/uL (ref 4.0–10.5)
nRBC: 0 % (ref 0.0–0.2)

## 2022-08-08 LAB — HIV ANTIBODY (ROUTINE TESTING W REFLEX): HIV Screen 4th Generation wRfx: NONREACTIVE

## 2022-08-08 LAB — TSH: TSH: 3.012 u[IU]/mL (ref 0.350–4.500)

## 2022-08-08 LAB — LIPASE, BLOOD: Lipase: 54 U/L — ABNORMAL HIGH (ref 11–51)

## 2022-08-08 LAB — PREALBUMIN: Prealbumin: 20 mg/dL (ref 18–38)

## 2022-08-08 MED ORDER — DEXTROSE 50 % IV SOLN
1.0000 | Freq: Once | INTRAVENOUS | Status: AC
  Administered 2022-08-08: 50 mL via INTRAVENOUS
  Filled 2022-08-08: qty 50

## 2022-08-08 MED ORDER — DEXTROSE 10 % IV SOLN: INTRAVENOUS | Status: DC

## 2022-08-08 MED ORDER — POTASSIUM CHLORIDE CRYS ER 20 MEQ PO TBCR
40.0000 meq | EXTENDED_RELEASE_TABLET | ORAL | Status: AC
  Filled 2022-08-08: qty 2

## 2022-08-08 MED ORDER — ACETAMINOPHEN 650 MG RE SUPP: 650.0000 mg | Freq: Four times a day (QID) | RECTAL | Status: DC | PRN

## 2022-08-08 MED ORDER — ATORVASTATIN CALCIUM 40 MG PO TABS
40.0000 mg | ORAL_TABLET | Freq: Every day | ORAL | Status: DC
  Administered 2022-08-08 – 2022-08-14 (×7): 40 mg via ORAL
  Filled 2022-08-08 (×7): qty 1

## 2022-08-08 MED ORDER — GABAPENTIN 300 MG PO CAPS
300.0000 mg | ORAL_CAPSULE | Freq: Three times a day (TID) | ORAL | Status: DC
  Administered 2022-08-08 – 2022-08-14 (×18): 300 mg via ORAL
  Filled 2022-08-08 (×19): qty 1

## 2022-08-08 MED ORDER — ONDANSETRON HCL 4 MG/2ML IJ SOLN
4.0000 mg | Freq: Four times a day (QID) | INTRAMUSCULAR | Status: DC | PRN
  Administered 2022-08-08 – 2022-08-12 (×2): 4 mg via INTRAVENOUS
  Filled 2022-08-08 (×2): qty 2

## 2022-08-08 MED ORDER — CALCITRIOL 0.25 MCG PO CAPS
0.2500 ug | ORAL_CAPSULE | Freq: Every day | ORAL | Status: DC
Start: 2022-08-08 — End: 2022-08-14
  Administered 2022-08-08 – 2022-08-14 (×7): 0.25 ug via ORAL
  Filled 2022-08-08 (×7): qty 1

## 2022-08-08 MED ORDER — DEXTROSE 50 % IV SOLN
1.0000 | INTRAVENOUS | Status: DC | PRN
  Administered 2022-08-08 – 2022-08-12 (×4): 50 mL via INTRAVENOUS
  Filled 2022-08-08 (×4): qty 50

## 2022-08-08 MED ORDER — HYDROXYZINE HCL 25 MG PO TABS
100.0000 mg | ORAL_TABLET | Freq: Three times a day (TID) | ORAL | Status: DC
  Administered 2022-08-08 – 2022-08-14 (×19): 100 mg via ORAL
  Filled 2022-08-08 (×19): qty 4

## 2022-08-08 MED ORDER — TRAMADOL HCL 50 MG PO TABS
50.0000 mg | ORAL_TABLET | Freq: Four times a day (QID) | ORAL | Status: DC | PRN
  Administered 2022-08-08 – 2022-08-12 (×5): 50 mg via ORAL
  Filled 2022-08-08 (×5): qty 1

## 2022-08-08 MED ORDER — METOPROLOL SUCCINATE ER 25 MG PO TB24
25.0000 mg | ORAL_TABLET | Freq: Every day | ORAL | Status: DC
  Administered 2022-08-08 – 2022-08-14 (×7): 25 mg via ORAL
  Filled 2022-08-08 (×7): qty 1

## 2022-08-08 MED ORDER — SODIUM CHLORIDE 0.9% FLUSH
3.0000 mL | Freq: Two times a day (BID) | INTRAVENOUS | Status: DC
  Administered 2022-08-08 – 2022-08-14 (×11): 3 mL via INTRAVENOUS

## 2022-08-08 MED ORDER — DEXTROSE 5 % IV SOLN: INTRAVENOUS | Status: DC

## 2022-08-08 MED ORDER — ALBUTEROL SULFATE (2.5 MG/3ML) 0.083% IN NEBU: 2.5000 mg | INHALATION_SOLUTION | Freq: Four times a day (QID) | RESPIRATORY_TRACT | Status: DC | PRN

## 2022-08-08 MED ORDER — MIRTAZAPINE 15 MG PO TABS
15.0000 mg | ORAL_TABLET | Freq: Every day | ORAL | Status: DC
  Administered 2022-08-08 – 2022-08-11 (×4): 15 mg via ORAL
  Filled 2022-08-08 (×4): qty 1

## 2022-08-08 MED ORDER — ACETAMINOPHEN 325 MG PO TABS: 650.0000 mg | ORAL_TABLET | Freq: Four times a day (QID) | ORAL | Status: DC | PRN

## 2022-08-08 MED ORDER — HYDRALAZINE HCL 20 MG/ML IJ SOLN: 10.0000 mg | INTRAMUSCULAR | Status: DC | PRN

## 2022-08-08 MED ORDER — METOPROLOL SUCCINATE ER 25 MG PO TB24: 25.0000 mg | ORAL_TABLET | Freq: Every day | ORAL | Status: DC

## 2022-08-08 MED ORDER — PANTOPRAZOLE SODIUM 40 MG PO TBEC
40.0000 mg | DELAYED_RELEASE_TABLET | Freq: Every day | ORAL | Status: DC
  Administered 2022-08-08 – 2022-08-14 (×7): 40 mg via ORAL
  Filled 2022-08-08 (×7): qty 1

## 2022-08-08 MED ORDER — BOOST / RESOURCE BREEZE PO LIQD CUSTOM
1.0000 | Freq: Three times a day (TID) | ORAL | Status: DC
  Administered 2022-08-08 – 2022-08-10 (×6): 1 via ORAL
  Filled 2022-08-08: qty 1

## 2022-08-08 MED ORDER — ENOXAPARIN SODIUM 40 MG/0.4ML IJ SOSY
40.0000 mg | PREFILLED_SYRINGE | INTRAMUSCULAR | Status: DC
  Administered 2022-08-08 – 2022-08-10 (×3): 40 mg via SUBCUTANEOUS
  Filled 2022-08-08 (×3): qty 0.4

## 2022-08-08 NOTE — H&P (Signed)
History and Physical    Patient: Julie Acevedo NWG:[id number] DOB: Feb 22, 1951 DOA: 08/08/2022 DOS: the patient was seen and examined on 08/08/2022 PCP: Jerrol Banana., MD  Patient coming from: Home via EMS  Chief Complaint:  Chief Complaint  Patient presents with   Hypoglycemia   HPI: Julie Acevedo is a 71 y.o. female with medical history significant of hypertension, hyperlipidemia, CAD, DM type II, arthritis, [name], thyroid nodules, and history of MRSA who presents due to low blood sugar.  Patient recalls going to bed and not feeling well.  She was awoken out of her sleep due to feeling her heart racing.  She got up because it felt like her heart was beating out of her chest, and reported that she got very sweaty.  He got up and sat down on the couch in the living room. Denied having any chest pain at the time.  She called 911 due to her symptoms and the next thing she recalls is being here at the hospital.  Patient reports that she has been having intermittent low blood sugars and had previously been hospitalized for the same.  She is not on insulin and only takes metformin if her blood sugars are elevated.  Furthermore, patient notes that over the last week she has been having persistent diarrhea.  Denies having any blood in stools.  Notes associated symptoms of lower abdominal soreness, nausea, some trace lower extremity swelling, urinary frequency, headaches, and neck discomfort.  Patient reports that she normally does not get headaches, but has been having them more frequently.  She reports having decreased range of motion when turning her neck to the right side and pain radiating down her neck.  Denies any recent trauma or falls to onset of the symptoms.  Her husband states that she has been having a poor appetite since she had gastric bypass.  He makes note that she drinks fluids, but does not eat appropriately.  Upon the fire department arrival patient was noted to be  unresponsive with CBG 58.  Patient was given D10 with IV placed with improvement in glucose to 173.  In the emergency department patient vital signs were noted to be stable.  Labs noted hemoglobin 10.2, potassium 3.3, BUN 14, creatinine 1.37, lipase 54, AST 43, and ALT 45.  Urinalysis noted moderate leukocytes with no bacteria seen and 0-5 WBCs.  Patient has been given amp of D50, but blood sugars dropped again as low as 60 while in the ED.  Patient was placed on D5 IV fluids at 125 mL/h  Review of Systems: As mentioned in the history of present illness. All other systems reviewed and are negative. Past Medical History:  Diagnosis Date   Anemia    Arthritis    COVID-19 11/19/2019   Diverticulitis    DM (diabetes mellitus) ([place])    typ e 2   History of methicillin resistant staphylococcus aureus (MRSA) 2017   HTN (hypertension)    Hyperlipidemia    Memory difficulties 09/01/2015   Multiple thyroid nodules    Myocardial infarction ([place]) 1995   Short-term memory loss    Thyroid nodule    Past Surgical History:  Procedure Laterality Date   ABDOMINAL HYSTERECTOMY     due to endometriosis-1 ovary left   BREAST EXCISIONAL BIOPSY Right 1971   neg   CARDIAC CATHETERIZATION  2000   no significant CAD per note of Dr Caryl Comes in 2013    CHOLECYSTECTOMY  CYSTOCELE REPAIR N/A 03/19/2017   Procedure: ANTERIOR REPAIR (CYSTOCELE);  Surgeon: Bjorn Loser, MD;  Location: [facility];  Service: Urology;  Laterality: N/A;   CYSTOSCOPY N/A 03/19/2017   Procedure: CYSTOSCOPY;  Surgeon: Bjorn Loser, MD;  Location: [facility];  Service: Urology;  Laterality: N/A;   ESOPHAGOGASTRODUODENOSCOPY (EGD) WITH PROPOFOL N/A 12/15/2021   Procedure: ESOPHAGOGASTRODUODENOSCOPY (EGD) WITH PROPOFOL;  Surgeon: Lesly Rubenstein, MD;  Location: [facility];  Service: Endoscopy;  Laterality: N/A;   EYE SURGERY     FOOT SURGERY     HAND SURGERY     HEMICOLECTOMY     INSERTION OF MESH N/A 08/21/2016   Procedure:  INSERTION OF MESH;  Surgeon: Excell Seltzer, MD;  Location: [facility];  Service: General;  Laterality: N/A;   INSERTION OF MESH N/A 12/08/2019   Procedure: INSERTION OF MESH;  Surgeon: Jules Husbands, MD;  Location: [facility];  Service: General;  Laterality: N/A;   LAPAROSCOPIC LYSIS OF ADHESIONS N/A 08/21/2016   Procedure: LAPAROSCOPIC LYSIS OF ADHESIONS;  Surgeon: Excell Seltzer, MD;  Location: [facility];  Service: General;  Laterality: N/A;   LAPAROSCOPIC REMOVAL ABDOMINAL MASS     LAPAROTOMY N/A 04/16/2019   Procedure: EXPLORATORY LAPAROTOMY;  Surgeon: Benjamine Sprague, DO;  Location: [facility];  Service: General;  Laterality: N/A;   LOOP RECORDER INSERTION N/A 03/21/2018   Procedure: LOOP RECORDER INSERTION;  Surgeon: Sanda Klein, MD;  Location: [facility];  Service: Cardiovascular;  Laterality: N/A;   LUMBAR DISC SURGERY     ROUX-EN-Y GASTRIC BYPASS  2010   Dr Hassell Done   SMALL INTESTINE SURGERY     TONSILLECTOMY     UPPER GI ENDOSCOPY  01/12/2016   normal larynx, normal esophagus. gastric bypass with a normal-sized pouch and intact staple line, normal examined jejunum, otherwise exam was normal   VENTRAL HERNIA REPAIR N/A 08/21/2016   Procedure: LAPAROSCOPIC VENTRAL HERNIA;  Surgeon: Excell Seltzer, MD;  Location: [facility];  Service: General;  Laterality: N/A;   XI ROBOTIC ASSISTED VENTRAL HERNIA N/A 12/08/2019   Procedure: XI ROBOTIC ASSISTED VENTRAL HERNIA;  Surgeon: Jules Husbands, MD;  Location: [facility];  Service: General;  Laterality: N/A;   Social History:  reports that she has never smoked. She has never used smokeless tobacco. She reports that she does not drink alcohol and does not use drugs.  Allergies  Allergen Reactions   Lotrel [Amlodipine Besy-Benazepril Hcl] Anaphylaxis   Contrast Media [Iodinated Contrast Media] Swelling and Other (See Comments)    Reaction:  Neck swelling    Niacin Hives   Sulfa Antibiotics Hives    Family History  Problem Relation Age  of Onset   Cancer Father 71       Lung   Coronary artery disease Father 79   COPD Mother    Diabetes Mother    Hypertension Mother    Cancer Paternal Grandmother        Breast   Breast cancer Paternal Grandmother    Congestive Heart Failure Maternal Grandmother    Emphysema Maternal Grandfather    Heart attack Paternal Grandfather    Cancer Paternal Aunt        Breast   Breast cancer Paternal Aunt 32   Breast cancer Paternal Aunt 7    Prior to Admission medications   Medication Sig Start Date End Date Taking? Authorizing Provider  aspirin EC 81 MG EC tablet Take 1 tablet (81 mg total) by mouth daily. Swallow whole. 09/08/21   Pokhrel,  Laxman, MD  atorvastatin (LIPITOR) 40 MG tablet Take 1 tablet (40 mg total) by mouth daily. 06/25/22   Jerrol Banana., MD  calcitRIOL (ROCALTROL) 0.25 MCG capsule Take 0.25 mcg by mouth daily.    [provider]  cyclobenzaprine (FLEXERIL) 5 MG tablet TAKE 1 TABLET BY MOUTH THREE TIMES A DAY AS NEEDED Patient not taking: Reported on 07/19/2022 10/31/21   Jerrol Banana., MD  diclofenac Sodium (VOLTAREN) 1 % GEL     [provider]  gabapentin (NEURONTIN) 300 MG capsule Take 1 capsule (300 mg total) by mouth 3 (three) times daily. 07/02/22   Jerrol Banana., MD  hydrOXYzine (VISTARIL) 100 MG capsule Take 1 capsule (100 mg total) by mouth 3 (three) times daily. 07/02/22   Jerrol Banana., MD  linaclotide [facility]) 290 MCG CAPS capsule TAKE 1 CAPSULE BY MOUTH EVERY DAY BEFORE BREAKFAST 06/25/22   Jerrol Banana., MD  metFORMIN (GLUCOPHAGE) 500 MG tablet Take 1 tablet (500 mg total) by mouth daily with breakfast. 06/25/22   Jerrol Banana., MD  metoprolol succinate (TOPROL-XL) 25 MG 24 hr tablet Take 1 tablet (25 mg total) by mouth daily. 06/25/22   Jerrol Banana., MD  mirtazapine (REMERON) 15 MG tablet Take 15 mg by mouth at bedtime.    [provider]  mupirocin ointment (BACTROBAN) 2 %  Apply to aa BID until healed. Patient not taking: Reported on 07/19/2022 05/30/22   Ralene Bathe, MD  omeprazole (PRILOSEC) 20 MG capsule Take 1 capsule (20 mg total) by mouth daily. 06/25/22   Jerrol Banana., MD  ondansetron (ZOFRAN) 4 MG tablet TAKE 1 TABLET BY MOUTH EVERY 8 HOURS AS NEEDED 07/30/22   Jerrol Banana., MD  oxyCODONE-acetaminophen (PERCOCET) 5-325 MG tablet Take 1 tablet by mouth every 4 (four) hours as needed for severe pain. Patient not taking: Reported on 07/19/2022 01/15/22   Felipa Furnace, DPM  rivastigmine (EXELON) 3 MG capsule Take 3 mg by mouth 2 (two) times daily.    [provider]  traMADol (ULTRAM) 50 MG tablet Take 1 tablet (50 mg total) by mouth every 6 (six) hours as needed. 01/11/22   Birdie Sons, MD  triamcinolone ointment (KENALOG) 0.5 % APPLY TO AFFECTED AREA TWICE A DAY Patient not taking: Reported on 07/19/2022 06/26/22   Jerrol Banana., MD    Physical Exam: Vitals:   08/08/22 0600 08/08/22 0615 08/08/22 0630 08/08/22 0645  BP: (!) 158/64 (!) 164/74 (!) 146/68 (!) 142/67  Pulse: (!) 59 77 (!) 59 (!) 58  Resp: '16 18 12 12  '$ Temp:  97.9 F (36.6 C)    TempSrc:  Oral    SpO2: 100% 99% 100% 100%  Weight:      Height:        Constitutional: Elderly female who appears to be in no acute distress at this time Eyes: PERRL, lids and conjunctivae normal ENMT: Mucous membranes are moist.   Neck: normal, decreased range of motion when turning to the left Respiratory: clear to auscultation bilaterally, no wheezing, no crackles. Normal respiratory effort. No accessory muscle use.  Cardiovascular: Regular rate and rhythm, no murmurs / rubs / gallops.  Trace bilateral extremity edema. 2+ pedal pulses. No carotid bruits.  Abdomen: Tenderness palpation of the lower abdomen.  Bowel sounds present all 4 quadrants. Musculoskeletal: no clubbing / cyanosis. No joint deformity upper and lower extremities. Good ROM, no contractures.  Normal  muscle tone.  Skin: no rashes, lesions, ulcers.  Neurologic: CN 2-12 grossly intact. Sensation intact, DTR normal. Strength 5/5 in all 4.  Psychiatric:  Alert and oriented x 3. Normal mood.   Data Reviewed:  Sinus bradycardia at 58   Assessment and Plan: Controlled diabetes mellitus type 2 with hypoglycemia Patient presents after found to have blood sugar as low as 58.  Suspect palpitations and diaphoresis secondary to hypoglycemia.  Patient has been given amp of D50, but blood sugars dropped as low as 60 again which she was placed on D5 IV fluids at 125 mL/h.  Her last hemoglobin A1c was 5.1 on 02/13/2022.  Risk factors include that she is on a beta-blocker.  Other possible causes for hypoglycemia include concern for liver dysfunction and/or malnutrition. -Admit to a progressive bed -Carb modified diet -Check TSH -Continue D5 IV fluids at 125 mL/h.  Plan is to discontinue once blood sugars greater than 170.  Diarrhea Lower abdominal pain Acute.  Patient reports having persistent diarrhea over the last week.  Home medications include Linzess 290 mcg daily. -Hold Linzess as can cause diarrhea -Check CT scan of the abdomen and pelvis  Anemia of chronic kidney disease Hemoglobin 10.2 g/dL, but appears to be around patient's baseline.  Patient denies any reports of bleeding -Recheck to see tomorrow morning  Possible UTI Patient reports having increased urinary frequency recently.  Urinalysis noted moderate leukocytes, negative nitrites, no bacteria seen, and 0-5 WBCs. -Check urine culture  Hypokalemia Acute.  Potassium initially 3.3.  Not related to patient's reports of diarrhea -Give potassium chloride 40 mEq p.o. -Continue to monitor and replace as needed  Essential hypertension Blood pressures anywhere from 124/63 -164/74.  Home blood pressure regimen includes metoprolol 25 mg daily. -Continue metoprolol with holding parameters -Consider discussing with primary provider in  regards to changing to a different blood pressure regimen  [name] Patient appears to be alert and oriented x4 at this time. -Delirium precautions -Continue outpatient follow-up with neurology  Elevated lipase  transaminitis Acute.     Labs noted lipase 53, AST 43, ALT 45. -Check CT scan of the abdomen and pelvis.  Hyperlipidemia Home medication regimen includes atorvastatin 40 mg daily. 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  Albumin 3.2. -Check prealbumin -Boost shakes in between meals  GERD -Continue pharmacy substitution of Protonix  DVT prophylaxis: Lovenox Advance Care Planning:   Code Status: Full Code   Consults: None  Family Communication: husband   Severity of Illness: The appropriate patient status for this patient is OBSERVATION. Observation status is judged to be reasonable and necessary in order to provide the required intensity of service to ensure the patient's safety. The patient's presenting symptoms, physical exam findings, and initial radiographic and laboratory data in the context of their medical condition is felt to place them at decreased risk for further clinical deterioration. Furthermore, it is anticipated that the patient will be medically stable for discharge from the hospital within 2 midnights of admission.   Author: Norval Morton, MD 08/08/2022 8:02 AM  For on call review [email or web address].

## 2022-08-08 NOTE — Progress Notes (Signed)
Pt has arrived on the unit, skin assessed, CHG completed, V/S taken, educated on call light and safety ensured.

## 2022-08-08 NOTE — ED Triage Notes (Signed)
Pt BIB EMS from home pt states her sugar was low. Pt unresponsive on fire arrival with CBG 58. 25G D10 given with improvement to 173. Pt was slow to come around after improvement in blood sugar. Pt states that she does not take insulin and states she does not eat as much as she should. A&Ox4 denies pain.

## 2022-08-08 NOTE — ED Notes (Addendum)
Daughter's Cellphone:  Mitsy: [phone]

## 2022-08-08 NOTE — ED Provider Notes (Signed)
[facility] EMERGENCY DEPARTMENT Provider Note   CSN: [id number] Arrival date & time: 08/08/22  0021     History  Chief Complaint  Patient presents with   Hypoglycemia    Julie Acevedo is a 71 y.o. female.  The history is provided by the patient and medical records.  Hypoglycemia  71 y.o. F with hx of HTN, adjustment disorder, anemia, lewy body dementia, DM2, hx of TIA, presenting to the ED for hypoglycemia.  Patient states she does have DM but is not currently on insulin.  CBG on EMS arrival 8.  She was give D10 and improved to 170's.  She does report not eating as much today as normal-- had a boiled egg this morning and chicken/shrimp this evening but not much else.  She does report some loose/watery stools this week.  Denies abdominal pain.  No fever/vomiting.  No sick contacts, travel, or changes in diet/abnormal food intake.  Home Medications Prior to Admission medications   Medication Sig Start Date End Date Taking? Authorizing Provider  aspirin EC 81 MG EC tablet Take 1 tablet (81 mg total) by mouth daily. Swallow whole. 09/08/21   Pokhrel, Corrie Mckusick, MD  atorvastatin (LIPITOR) 40 MG tablet Take 1 tablet (40 mg total) by mouth daily. 06/25/22   Jerrol Banana., MD  calcitRIOL (ROCALTROL) 0.25 MCG capsule Take 0.25 mcg by mouth daily.    [provider]  cyclobenzaprine (FLEXERIL) 5 MG tablet TAKE 1 TABLET BY MOUTH THREE TIMES A DAY AS NEEDED Patient not taking: Reported on 07/19/2022 10/31/21   Jerrol Banana., MD  diclofenac Sodium (VOLTAREN) 1 % GEL     [provider]  gabapentin (NEURONTIN) 300 MG capsule Take 1 capsule (300 mg total) by mouth 3 (three) times daily. 07/02/22   Jerrol Banana., MD  hydrOXYzine (VISTARIL) 100 MG capsule Take 1 capsule (100 mg total) by mouth 3 (three) times daily. 07/02/22   Jerrol Banana., MD  linaclotide [facility]) 290 MCG CAPS capsule TAKE 1 CAPSULE BY MOUTH EVERY DAY BEFORE  BREAKFAST 06/25/22   Jerrol Banana., MD  metFORMIN (GLUCOPHAGE) 500 MG tablet Take 1 tablet (500 mg total) by mouth daily with breakfast. 06/25/22   Jerrol Banana., MD  metoprolol succinate (TOPROL-XL) 25 MG 24 hr tablet Take 1 tablet (25 mg total) by mouth daily. 06/25/22   Jerrol Banana., MD  mirtazapine (REMERON) 15 MG tablet Take 15 mg by mouth at bedtime.    [provider]  mupirocin ointment (BACTROBAN) 2 % Apply to aa BID until healed. Patient not taking: Reported on 07/19/2022 05/30/22   Ralene Bathe, MD  omeprazole (PRILOSEC) 20 MG capsule Take 1 capsule (20 mg total) by mouth daily. 06/25/22   Jerrol Banana., MD  ondansetron (ZOFRAN) 4 MG tablet TAKE 1 TABLET BY MOUTH EVERY 8 HOURS AS NEEDED 07/30/22   Jerrol Banana., MD  oxyCODONE-acetaminophen (PERCOCET) 5-325 MG tablet Take 1 tablet by mouth every 4 (four) hours as needed for severe pain. Patient not taking: Reported on 07/19/2022 01/15/22   Felipa Furnace, DPM  rivastigmine (EXELON) 3 MG capsule Take 3 mg by mouth 2 (two) times daily.    [provider]  traMADol (ULTRAM) 50 MG tablet Take 1 tablet (50 mg total) by mouth every 6 (six) hours as needed. 01/11/22   Birdie Sons, MD  triamcinolone ointment (KENALOG) 0.5 % APPLY TO AFFECTED AREA  TWICE A DAY Patient not taking: Reported on 07/19/2022 06/26/22   Jerrol Banana., MD      Allergies    Lotrel [amlodipine besy-benazepril hcl], Contrast media [iodinated contrast media], Niacin, and Sulfa antibiotics    Review of Systems   Review of Systems  Gastrointestinal:  Positive for diarrhea.  Endocrine:       Hypoglycemia  All other systems reviewed and are negative.   Physical Exam Updated Vital Signs BP 137/63   Pulse (!) 58   Temp 97.8 F (36.6 C) (Oral)   Resp 12   Ht '5\' 3"'$  (1.6 m)   Wt 56.1 kg   SpO2 100%   BMI 21.89 kg/m   Physical Exam Vitals and nursing note reviewed.  Constitutional:       Appearance: She is well-developed.  HENT:     Head: Normocephalic and atraumatic.  Eyes:     Conjunctiva/sclera: Conjunctivae normal.     Pupils: Pupils are equal, round, and reactive to light.  Cardiovascular:     Rate and Rhythm: Normal rate and regular rhythm.     Heart sounds: Normal heart sounds.  Pulmonary:     Effort: Pulmonary effort is normal.     Breath sounds: Normal breath sounds.  Abdominal:     General: Bowel sounds are normal.     Palpations: Abdomen is soft.     Tenderness: There is no abdominal tenderness. There is no rebound.  Musculoskeletal:        General: Normal range of motion.     Cervical back: Normal range of motion.  Skin:    General: Skin is warm and dry.  Neurological:     Mental Status: She is alert and oriented to person, place, and time.     ED Results / Procedures / Treatments   Labs (all labs ordered are listed, but only abnormal results are displayed) Labs Reviewed  CBC WITH DIFFERENTIAL/PLATELET - Abnormal; Notable for the following components:      Result Value   RBC 3.75 (*)    Hemoglobin 10.2 (*)    HCT 32.5 (*)    All other components within normal limits  COMPREHENSIVE METABOLIC PANEL - Abnormal; Notable for the following components:   Potassium 3.3 (*)    Glucose, Bld 113 (*)    Creatinine, Ser 1.37 (*)    Total Protein 6.0 (*)    Albumin 3.2 (*)    AST 43 (*)    ALT 45 (*)    Total Bilirubin 0.2 (*)    GFR, Estimated 42 (*)    All other components within normal limits  LIPASE, BLOOD - Abnormal; Notable for the following components:   Lipase 54 (*)    All other components within normal limits  URINALYSIS, ROUTINE W REFLEX MICROSCOPIC - Abnormal; Notable for the following components:   Glucose, UA 50 (*)    Leukocytes,Ua MODERATE (*)    All other components within normal limits  CBG MONITORING, ED - Abnormal; Notable for the following components:   Glucose-Capillary 116 (*)    All other components within normal limits   CBG MONITORING, ED - Abnormal; Notable for the following components:   Glucose-Capillary 69 (*)    All other components within normal limits  CBG MONITORING, ED - Abnormal; Notable for the following components:   Glucose-Capillary 146 (*)    All other components within normal limits  CBG MONITORING, ED - Abnormal; Notable for the following components:   Glucose-Capillary 129 (*)  All other components within normal limits  CBG MONITORING, ED - Abnormal; Notable for the following components:   Glucose-Capillary 60 (*)    All other components within normal limits  CBG MONITORING, ED    EKG None  Radiology No results found.  Procedures Procedures    CRITICAL CARE Performed by: Larene Pickett   Total critical care time: 35 minutes  Critical care time was exclusive of separately billable procedures and treating other patients.  Critical care was necessary to treat or prevent imminent or life-threatening deterioration.  Critical care was time spent personally by me on the following activities: development of treatment plan with patient and/or surrogate as well as nursing, discussions with consultants, evaluation of patient's response to treatment, examination of patient, obtaining history from patient or surrogate, ordering and performing treatments and interventions, ordering and review of laboratory studies, ordering and review of radiographic studies, pulse oximetry and re-evaluation of patient's condition.   Medications Ordered in ED Medications  dextrose 5 % solution ( Intravenous New Bag/Given 08/08/22 0619)  dextrose 50 % solution 50 mL (50 mLs Intravenous Given 08/08/22 0300)    ED Course/ Medical Decision Making/ A&P                           Medical Decision Making Amount and/or Complexity of Data Reviewed Labs: ordered. ECG/medicine tests: ordered and independent interpretation performed.  Risk Prescription drug management. Decision regarding  hospitalization.   71 y.o. F presenting to the ED with hypoglycemia.  CBG 58 on EMS arrival.  She was given D10 with improvement to the 170s.  Maintaining sugar at 116 on arrival.  She is asymptomatic currently.  She is currently only on metformin for her diabetes, no insulin.  She does report poor oral intake today.  We will check screening labs.  Labs as above-- no significant electrolyte derangement.  No leukocytosis.  UA pending.  3:00 AM CBG back down to 69.  Patient still awake/alert.  Asymptomatic currently.  Given OJ, snack, and amp of D50.  5:52 AM Last 2 blood sugars >100, now down to 75 again.  Upon further chart review-- she does have a hx of recurrent hypoglycemic episodes, family confirms without known cause.  No recent medication changes.  Will re-check CBG once more in about 30 mins.  UA with moderate leuks but no bacteria, O-5 WBCs.  6:20 AM CBG back down to 60.  At this point, cannot maintain blood sugar. D5 drip started.  Will admit for hypoglycemia.    Discussed with Dr. Hal Hope-- will admit for ongoing care.  Final Clinical Impression(s) / ED Diagnoses Final diagnoses:  Hypoglycemia    Rx / DC Orders ED Discharge Orders     None         Larene Pickett, PA-C 08/08/22 0300    Quintella Reichert, MD 08/08/22 [phone]

## 2022-08-08 NOTE — ED Notes (Signed)
Pt CBG dropped from 84 to 71 despite being on d5 fluid drip. Paged provider to notify, awaiting on return call.

## 2022-08-08 NOTE — ED Notes (Signed)
Patient ambulated to restroom. Tolerated well, was able to provider a urine sample.

## 2022-08-08 NOTE — ED Notes (Signed)
Pt states MD asked pt to drink oral contrast, contrast made pt nauseous and vomit. Provider notified.

## 2022-08-08 NOTE — ED Notes (Signed)
Unable to collect labs x2 attempts. Phlebotomy to attempt lab draw.

## 2022-08-09 DIAGNOSIS — E876 Hypokalemia: Secondary | ICD-10-CM | POA: Diagnosis not present

## 2022-08-09 DIAGNOSIS — R4182 Altered mental status, unspecified: Secondary | ICD-10-CM | POA: Diagnosis not present

## 2022-08-09 DIAGNOSIS — R748 Abnormal levels of other serum enzymes: Secondary | ICD-10-CM | POA: Diagnosis present

## 2022-08-09 DIAGNOSIS — Z981 Arthrodesis status: Secondary | ICD-10-CM | POA: Diagnosis not present

## 2022-08-09 DIAGNOSIS — N390 Urinary tract infection, site not specified: Secondary | ICD-10-CM | POA: Diagnosis present

## 2022-08-09 DIAGNOSIS — F0280 Dementia in other diseases classified elsewhere without behavioral disturbance: Secondary | ICD-10-CM | POA: Diagnosis present

## 2022-08-09 DIAGNOSIS — Z8616 Personal history of COVID-19: Secondary | ICD-10-CM | POA: Diagnosis not present

## 2022-08-09 DIAGNOSIS — I129 Hypertensive chronic kidney disease with stage 1 through stage 4 chronic kidney disease, or unspecified chronic kidney disease: Secondary | ICD-10-CM | POA: Diagnosis present

## 2022-08-09 DIAGNOSIS — N1832 Chronic kidney disease, stage 3b: Secondary | ICD-10-CM | POA: Diagnosis not present

## 2022-08-09 DIAGNOSIS — K912 Postsurgical malabsorption, not elsewhere classified: Secondary | ICD-10-CM | POA: Diagnosis present

## 2022-08-09 DIAGNOSIS — E11649 Type 2 diabetes mellitus with hypoglycemia without coma: Secondary | ICD-10-CM | POA: Diagnosis present

## 2022-08-09 DIAGNOSIS — I2510 Atherosclerotic heart disease of native coronary artery without angina pectoris: Secondary | ICD-10-CM | POA: Diagnosis present

## 2022-08-09 DIAGNOSIS — Z9884 Bariatric surgery status: Secondary | ICD-10-CM | POA: Diagnosis not present

## 2022-08-09 DIAGNOSIS — E162 Hypoglycemia, unspecified: Secondary | ICD-10-CM | POA: Diagnosis not present

## 2022-08-09 DIAGNOSIS — Z6821 Body mass index (BMI) 21.0-21.9, adult: Secondary | ICD-10-CM | POA: Diagnosis not present

## 2022-08-09 DIAGNOSIS — I10 Essential (primary) hypertension: Secondary | ICD-10-CM | POA: Diagnosis not present

## 2022-08-09 DIAGNOSIS — R7401 Elevation of levels of liver transaminase levels: Secondary | ICD-10-CM | POA: Diagnosis present

## 2022-08-09 DIAGNOSIS — E8809 Other disorders of plasma-protein metabolism, not elsewhere classified: Secondary | ICD-10-CM | POA: Diagnosis present

## 2022-08-09 DIAGNOSIS — F02A0 Dementia in other diseases classified elsewhere, mild, without behavioral disturbance, psychotic disturbance, mood disturbance, and anxiety: Secondary | ICD-10-CM | POA: Diagnosis not present

## 2022-08-09 DIAGNOSIS — Z9889 Other specified postprocedural states: Secondary | ICD-10-CM | POA: Diagnosis not present

## 2022-08-09 DIAGNOSIS — K9589 Other complications of other bariatric procedure: Secondary | ICD-10-CM | POA: Diagnosis present

## 2022-08-09 DIAGNOSIS — E440 Moderate protein-calorie malnutrition: Secondary | ICD-10-CM | POA: Diagnosis not present

## 2022-08-09 DIAGNOSIS — G3183 Dementia with Lewy bodies: Secondary | ICD-10-CM | POA: Diagnosis present

## 2022-08-09 DIAGNOSIS — Y738 Miscellaneous gastroenterology and urology devices associated with adverse incidents, not elsewhere classified: Secondary | ICD-10-CM | POA: Diagnosis present

## 2022-08-09 DIAGNOSIS — K5730 Diverticulosis of large intestine without perforation or abscess without bleeding: Secondary | ICD-10-CM | POA: Diagnosis not present

## 2022-08-09 DIAGNOSIS — D631 Anemia in chronic kidney disease: Secondary | ICD-10-CM | POA: Diagnosis not present

## 2022-08-09 DIAGNOSIS — R197 Diarrhea, unspecified: Secondary | ICD-10-CM | POA: Diagnosis not present

## 2022-08-09 DIAGNOSIS — I252 Old myocardial infarction: Secondary | ICD-10-CM | POA: Diagnosis not present

## 2022-08-09 DIAGNOSIS — Y838 Other surgical procedures as the cause of abnormal reaction of the patient, or of later complication, without mention of misadventure at the time of the procedure: Secondary | ICD-10-CM | POA: Diagnosis present

## 2022-08-09 DIAGNOSIS — E785 Hyperlipidemia, unspecified: Secondary | ICD-10-CM | POA: Diagnosis present

## 2022-08-09 DIAGNOSIS — K219 Gastro-esophageal reflux disease without esophagitis: Secondary | ICD-10-CM | POA: Diagnosis present

## 2022-08-09 DIAGNOSIS — Z79899 Other long term (current) drug therapy: Secondary | ICD-10-CM | POA: Diagnosis not present

## 2022-08-09 DIAGNOSIS — E1122 Type 2 diabetes mellitus with diabetic chronic kidney disease: Secondary | ICD-10-CM | POA: Diagnosis present

## 2022-08-09 LAB — CBC
HCT: 30.2 % — ABNORMAL LOW (ref 36.0–46.0)
Hemoglobin: 9.6 g/dL — ABNORMAL LOW (ref 12.0–15.0)
MCH: 27 pg (ref 26.0–34.0)
MCHC: 31.8 g/dL (ref 30.0–36.0)
MCV: 84.8 fL (ref 80.0–100.0)
Platelets: 193 10*3/uL (ref 150–400)
RBC: 3.56 MIL/uL — ABNORMAL LOW (ref 3.87–5.11)
RDW: 13.7 % (ref 11.5–15.5)
WBC: 6.8 10*3/uL (ref 4.0–10.5)
nRBC: 0 % (ref 0.0–0.2)

## 2022-08-09 LAB — BASIC METABOLIC PANEL
Anion gap: 5 (ref 5–15)
BUN: 6 mg/dL — ABNORMAL LOW (ref 8–23)
CO2: 27 mmol/L (ref 22–32)
Calcium: 8.6 mg/dL — ABNORMAL LOW (ref 8.9–10.3)
Chloride: 106 mmol/L (ref 98–111)
Creatinine, Ser: 1.21 mg/dL — ABNORMAL HIGH (ref 0.44–1.00)
GFR, Estimated: 49 mL/min — ABNORMAL LOW (ref >60)
Glucose, Bld: 129 mg/dL — ABNORMAL HIGH (ref 70–99)
Potassium: 3.6 mmol/L (ref 3.5–5.1)
Sodium: 138 mmol/L (ref 135–145)

## 2022-08-09 LAB — GLUCOSE, CAPILLARY
Glucose-Capillary: 130 mg/dL — ABNORMAL HIGH (ref 70–99)
Glucose-Capillary: 137 mg/dL — ABNORMAL HIGH (ref 70–99)
Glucose-Capillary: 140 mg/dL — ABNORMAL HIGH (ref 70–99)
Glucose-Capillary: 167 mg/dL — ABNORMAL HIGH (ref 70–99)
Glucose-Capillary: 44 mg/dL — CL (ref 70–99)
Glucose-Capillary: 56 mg/dL — ABNORMAL LOW (ref 70–99)
Glucose-Capillary: 75 mg/dL (ref 70–99)
Glucose-Capillary: 88 mg/dL (ref 70–99)
Glucose-Capillary: 88 mg/dL (ref 70–99)
Glucose-Capillary: 90 mg/dL (ref 70–99)
Glucose-Capillary: 99 mg/dL (ref 70–99)

## 2022-08-09 LAB — IRON AND TIBC
Iron: 62 ug/dL (ref 28–170)
Saturation Ratios: 26 % (ref 10.4–31.8)
TIBC: 242 ug/dL — ABNORMAL LOW (ref 250–450)
UIBC: 180 ug/dL

## 2022-08-09 LAB — FOLATE: Folate: 19.8 ng/mL (ref >5.9)

## 2022-08-09 LAB — VITAMIN B12: Vitamin B-12: 496 pg/mL (ref 180–914)

## 2022-08-09 LAB — FERRITIN: Ferritin: 519 ng/mL — ABNORMAL HIGH (ref 11–307)

## 2022-08-09 LAB — VITAMIN D 25 HYDROXY (VIT D DEFICIENCY, FRACTURES): Vit D, 25-Hydroxy: 51.23 ng/mL (ref 30–100)

## 2022-08-09 LAB — HEMOGLOBIN A1C
Hgb A1c MFr Bld: 5.4 % (ref 4.8–5.6)
Mean Plasma Glucose: 108.28 mg/dL

## 2022-08-09 MED ORDER — MAGNESIUM SULFATE IN D5W 1-5 GM/100ML-% IV SOLN
1.0000 g | Freq: Once | INTRAVENOUS | Status: AC
  Administered 2022-08-09: 1 g via INTRAVENOUS
  Filled 2022-08-09: qty 100

## 2022-08-09 MED ORDER — ZINC SULFATE 220 (50 ZN) MG PO CAPS
220.0000 mg | ORAL_CAPSULE | Freq: Two times a day (BID) | ORAL | Status: DC
  Administered 2022-08-09 – 2022-08-14 (×10): 220 mg via ORAL
  Filled 2022-08-09 (×10): qty 1

## 2022-08-09 MED ORDER — THIAMINE HCL 100 MG/ML IJ SOLN
500.0000 mg | Freq: Three times a day (TID) | INTRAVENOUS | Status: DC
  Administered 2022-08-09 – 2022-08-11 (×5): 500 mg via INTRAVENOUS
  Filled 2022-08-09 (×8): qty 5

## 2022-08-09 MED ORDER — CYANOCOBALAMIN 1000 MCG/ML IJ SOLN
1000.0000 ug | Freq: Every day | INTRAMUSCULAR | Status: DC
  Administered 2022-08-09 – 2022-08-12 (×4): 1000 ug via INTRAMUSCULAR
  Filled 2022-08-09 (×4): qty 1

## 2022-08-09 NOTE — Care Management Obs Status (Signed)
[facility] NOTIFICATION   Patient Details  Name: Julie Acevedo MRN: [id number] Date of Birth: [birth year]   Medicare Observation Status Notification Given:  Yes    Cyndi Bender, RN 08/09/2022, 12:19 PM

## 2022-08-09 NOTE — Evaluation (Signed)
Physical Therapy Evaluation Patient Details Name: Julie Acevedo MRN: [id number] DOB: [birth year] Today's Date: 08/09/2022  History of Present Illness  pt is a 71 y/o female presenting 8/23 with hypoglycemic episode.  Doesn't remember getting to the hospital.  PMHx:  HTN, HLD, CAD, DM, [name].  Clinical Impression  Pt is at or close to baseline functioning and should be safe at home with available assist from husband. There are no further acute PT needs.  Will sign off at this time.        Recommendations for follow up therapy are one component of a multi-disciplinary discharge planning process, led by the attending physician.  Recommendations may be updated based on patient status, additional functional criteria and insurance authorization.  Follow Up Recommendations No PT follow up      Assistance Recommended at Discharge PRN  Patient can return home with the following       Equipment Recommendations None recommended by PT  Recommendations for Other Services       Functional Status Assessment Patient has had a recent decline in their functional status and demonstrates the ability to make significant improvements in function in a reasonable and predictable amount of time.     Precautions / Restrictions Precautions Precautions: None      Mobility  Bed Mobility Overal bed mobility: Modified Independent                  Transfers Overall transfer level: Modified independent                      Ambulation/Gait Ambulation/Gait assistance: Supervision Gait Distance (Feet): 300 Feet Assistive device: None Gait Pattern/deviations: Step-through pattern   Gait velocity interpretation: >4.37 ft/sec, indicative of normal walking speed   General Gait Details: geneerally steady, can attain age appropriate gait speeds, scan and change direction abruptly without incident.  Stairs Stairs: Yes Stairs assistance: Supervision Stair Management: One rail  Right, Alternating pattern, Forwards Number of Stairs: 5 General stair comments: safe with rails  Wheelchair Mobility    Modified Rankin (Stroke Patients Only)       Balance Overall balance assessment: Modified Independent                                           Pertinent Vitals/Pain Pain Assessment Pain Assessment: No/denies pain    Home Living Family/patient expects to be discharged to:: Private residence Living Arrangements: Spouse/significant other Available Help at Discharge: Family Type of Home: House Home Access: Stairs to enter Entrance Stairs-Rails: Left Entrance Stairs-Number of Steps: 5 Alternate Level Stairs-Number of Steps: Pt does not go upstairs anymore Home Layout: Two level;Able to live on main level with bedroom/bathroom Home Equipment: Rolling Walker (2 wheels);Cane - single point;BSC/3in1;Grab bars - toilet;Grab bars - tub/shower;Wheelchair - manual      Prior Function Prior Level of Function : Independent/Modified Independent                     Hand Dominance   Dominant Hand: Left    Extremity/Trunk Assessment   Upper Extremity Assessment Upper Extremity Assessment: Overall WFL for tasks assessed    Lower Extremity Assessment Lower Extremity Assessment: Overall WFL for tasks assessed    Cervical / Trunk Assessment Cervical / Trunk Assessment: Normal  Communication   Communication: No difficulties  Cognition Arousal/Alertness: Awake/alert Behavior During Therapy:  WFL for tasks assessed/performed Overall Cognitive Status: History of cognitive impairments - at baseline                                          General Comments      Exercises     Assessment/Plan    PT Assessment Patient does not need any further PT services  PT Problem List         PT Treatment Interventions      PT Goals (Current goals can be found in the Care Plan section)  Acute Rehab PT Goals Patient Stated  Goal: home PT Goal Formulation: All assessment and education complete, DC therapy    Frequency       Co-evaluation               AM-PAC PT "6 Clicks" Mobility  Outcome Measure Help needed turning from your back to your side while in a flat bed without using bedrails?: None Help needed moving from lying on your back to sitting on the side of a flat bed without using bedrails?: None Help needed moving to and from a bed to a chair (including a wheelchair)?: None Help needed standing up from a chair using your arms (e.g., wheelchair or bedside chair)?: None Help needed to walk in hospital room?: None Help needed climbing 3-5 steps with a railing? : A Lot 6 Click Score: 22    End of Session   Activity Tolerance: Patient tolerated treatment well Patient left: in bed;with call bell/phone within reach Nurse Communication: Mobility status PT Visit Diagnosis: Other abnormalities of gait and mobility (R26.89)    Time: 6222-9798 PT Time Calculation (min) (ACUTE ONLY): 15 min   Charges:   PT Evaluation $PT Eval Moderate Complexity: 1 Mod          08/09/2022  Ginger Carne., PT Acute Rehabilitation Services [phone]  (pager) [phone]  (office)  Tessie Fass Aeron Lheureux 08/09/2022, 12:40 PM

## 2022-08-09 NOTE — Progress Notes (Signed)
PROGRESS NOTE    Julie Acevedo  WJX:[id number] DOB: 1951/03/12 DOA: 08/08/2022 PCP: Jerrol Banana., MD   Chief Complaint  Patient presents with   Hypoglycemia    Brief Narrative:   Julie Acevedo is a 71 y.o. female with medical history significant of hypertension, hyperlipidemia, CAD, DM type II, arthritis, [name], thyroid nodules, and history of MRSA who presents due to low blood sugar.  Patient recalls going to bed and not feeling well.  She was awoken out of her sleep due to feeling her heart racing.  She got up because it felt like her heart was beating out of her chest, and reported that she got very sweaty.  He got up and sat down on the couch in the living room. Denied having any chest pain at the time.  She called 911 due to her symptoms and the next thing she recalls is being here at the hospital.  Patient reports that she has been having intermittent low blood sugars and had previously been hospitalized for the same.  She is not on insulin and only takes metformin if her blood sugars are elevated.  Furthermore, patient notes that over the last week she has been having persistent diarrhea.  Denies having any blood in stools.  Notes associated symptoms of lower abdominal soreness, nausea, some trace lower extremity swelling, urinary frequency, headaches, and neck discomfort.  Patient reports that she normally does not get headaches, but has been having them more frequently.  She reports having decreased range of motion when turning her neck to the right side and pain radiating down her neck.  Denies any recent trauma or falls to onset of the symptoms.   Her husband states that she has been having a poor appetite since she had gastric bypass.  He makes note that she drinks fluids, but does not eat appropriately.   Upon the fire department arrival patient was noted to be unresponsive with CBG 58.  Patient was given D10 with IV placed with improvement in glucose to 173.   In  the emergency department patient vital signs were noted to be stable.  Labs noted hemoglobin 10.2, potassium 3.3, BUN 14, creatinine 1.37, lipase 54, AST 43, and ALT 45.  Urinalysis noted moderate leukocytes with no bacteria seen and 0-5 WBCs.  Patient has been given amp of D50, but blood sugars dropped again as low as 60 while in the ED.  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 -She was kept on D10 W earlier today, she was encouraged to increase oral intake, started on Ensure and regular diet. 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  Diarrhea -With concern of malabsorption syndrome will check zinc level, and start on zinc   Anemia of chronic kidney disease Hemoglobin 10.2 g/dL, but appears to be around patient's baseline.  Patient denies any reports of bleeding -Check B12 due  to concern of malabsorption.   Possible UTI Patient reports having increased urinary frequency recently.  Urinalysis noted moderate leukocytes, negative nitrites, no bacteria seen, and 0-5 WBCs. -Check urine culture   Hypokalemia -Repleted, will give IV magnesium as well   Essential hypertension Blood pressures anywhere from 124/63 -164/74.  Home blood pressure regimen includes metoprolol 25 mg daily. -Continue metoprolol with holding parameters   [name] Patient appears to be alert and oriented x4 at this time. -Delirium precautions -Continue outpatient follow-up with neurology   Elevated lipase  transaminitis Acute.     Labs noted lipase 53, AST 43, ALT 45. -Continue to trend    Hyperlipidemia Home medication regimen includes atorvastatin 40 mg daily. -Continue statin    Hypoalbuminemia Acute.  Albumin 3.2. -Check prealbumin -Boost shakes in between meals   GERD -Continue pharmacy substitution of Protonix   DVT prophylaxis: Lovenox/ Code Status: Full Family Communication: daughter at bedside  Disposition:   Status is: Observation need to change to inpatient given her malnutrition symptoms, and need for IV thiamine to prevent significant neurological complication.   Consultants:  none   Subjective:  Generalized weakness, fatigue, poor appetite  Objective: Vitals:   08/08/22 2014 08/09/22 0005 08/09/22 0400 08/09/22 0800  BP: (!) 143/70 135/66 111/60 (!) 141/74  Pulse: 69 61 60 60  Resp: '17 18 20 16  '$ Temp: (!) 97.3 F (36.3 C) (!) 97.5 F (36.4 C) 97.6 F (36.4 C)   TempSrc: Oral Oral Oral   SpO2: 99% 100% 99%   Weight: 56.8 kg     Height: '5\' 3"'$  (1.6 m)       Intake/Output Summary (Last 24 hours) at 08/09/2022 1623 Last data filed at 08/09/2022 1515 Gross per 24 hour  Intake 1090.85 ml  Output --  Net 1090.85 ml   Filed Weights   08/08/22 0024 08/08/22 2014  Weight: 56.1 kg 56.8 kg    Examination:  Awake Alert, Oriented X 3,  frail, deconditioned, thin appearing Symmetrical Chest wall movement, Good air movement bilaterally, CTAB RRR,No Gallops,Rubs or new Murmurs, No Parasternal Heave +ve B.Sounds, Abd Soft, No tenderness, No rebound - guarding or rigidity. No Cyanosis, Clubbing or edema, No new Rash or bruise       Data Reviewed: I have personally reviewed following labs and imaging studies  CBC: Recent Labs  Lab 08/08/22 0115 08/09/22 0404  WBC 7.9 6.8  NEUTROABS 5.5  --   HGB 10.2* 9.6*  HCT 32.5* 30.2*  MCV 86.7 84.8  PLT 224 865    Basic Metabolic Panel: Recent Labs  Lab 08/08/22 0115 08/08/22 1242 08/09/22 0404  NA 141  --  138  K 3.3*  --  3.6  CL 107  --  106  CO2 29  --  27  GLUCOSE 113*  --  129*  BUN 14  --  6*  CREATININE 1.37*  --  1.21*  CALCIUM 9.2  --  8.6*  MG  --  1.7  --     GFR: Estimated Creatinine Clearance: 36.3 mL/min (A) (by C-G formula based on SCr of 1.21 mg/dL (H)).  Liver Function Tests: Recent Labs  Lab 08/08/22 0115  AST 43*  ALT 45*  ALKPHOS 123  BILITOT 0.2*  PROT 6.0*  ALBUMIN 3.2*    CBG: Recent Labs  Lab 08/09/22 0926 08/09/22 1030 08/09/22 1206 08/09/22 1231 08/09/22 1432  GLUCAP 130* 88 137* 88 99     No results found for this or any previous visit (from the past 240 hour(s)).       Radiology Studies: DG Cervical Spine 2 or 3 views  Result Date: 08/08/2022 CLINICAL DATA:  Left-sided neck pain.  No known injury EXAM: CERVICAL SPINE - 2-3 VIEW COMPARISON:  04/28/2020 FINDINGS: There is no evidence of cervical spine fracture or prevertebral soft tissue swelling. Straightening of the cervical lordosis without static listhesis. Multilevel disc height loss most pronounced at C2-3, C5-6, and C6-7. IMPRESSION: 1. No acute osseous abnormality of the cervical spine. 2. Mild-moderate multilevel cervical spondylosis, similar to slightly progressed from prior. Electronically Signed   By: Davina Poke D.O.   On: 08/08/2022 14:11         Scheduled Meds:  atorvastatin  40 mg Oral Daily   calcitRIOL  0.25 mcg Oral Daily   cyanocobalamin  1,000 mcg Intramuscular Daily   enoxaparin (LOVENOX) injection  40 mg Subcutaneous Q24H   feeding supplement  1 Container Oral TID BM   gabapentin  300 mg Oral TID   hydrOXYzine  100 mg Oral TID   metoprolol succinate  25 mg Oral Daily   mirtazapine  15 mg Oral QHS   pantoprazole  40 mg Oral Daily   sodium chloride flush  3 mL Intravenous Q12H   Continuous Infusions:  thiamine (VITAMIN B1) injection       LOS: 0 days       Phillips Climes, MD [facility]   To contact the attending provider between 7A-7P or the covering provider during after hours 7P-7A, please log into the web site [email or web address] and access using universal [place] password for that web site. If you do not have the password, please call the hospital operator.  08/09/2022, 4:23 PM

## 2022-08-09 NOTE — Discharge Instructions (Addendum)
Follow with Primary MD Jerrol Banana., MD in 7 days   Get CBC, CMP, thiamine, B12 and A1c levels     Activity: As tolerated with Full fall precautions use walker/cane & assistance as needed   Disposition Home    Diet: Regular Diet.   On your next visit with your primary care physician please Get Medicines reviewed and adjusted.   Please request your Prim.MD to go over all Hospital Tests and Procedure/Radiological results at the follow up, please get all Hospital records sent to your Prim MD by signing hospital release before you go home.   If you experience worsening of your admission symptoms, develop shortness of breath, life threatening emergency, suicidal or homicidal thoughts you must seek medical attention immediately by calling 911 or calling your MD immediately  if symptoms less severe.  You Must read complete instructions/literature along with all the possible adverse reactions/side effects for all the Medicines you take and that have been prescribed to you. Take any new Medicines after you have completely understood and accpet all the possible adverse reactions/side effects.   Do not drive, operating heavy machinery, perform activities at heights, swimming or participation in water activities or provide baby sitting services if your were admitted for syncope or siezures until you have seen by Primary MD or a Neurologist and advised to do so again.  Do not drive when taking Pain medications.    Do not take more than prescribed Pain, Sleep and Anxiety Medications  Special Instructions: If you have smoked or chewed Tobacco  in the last 2 yrs please stop smoking, stop any regular Alcohol  and or any Recreational drug use.  Wear Seat belts while driving.   Please note  You were cared for by a hospitalist during your hospital stay. If you have any questions about your discharge medications or the care you received while you were in the hospital after you are  discharged, you can call the unit and asked to speak with the hospitalist on call if the hospitalist that took care of you is not available. Once you are discharged, your primary care physician will handle any further medical issues. Please note that NO REFILLS for any discharge medications will be authorized once you are discharged, as it is imperative that you return to your primary care physician (or establish a relationship with a primary care physician if you do not have one) for your aftercare needs so that they can reassess your need for medications and monitor your lab values.     High-Calorie, High-Protein Nutrition Therapy (2021)  A high-calorie, high-protein diet has been recommended to you. Your registered dietitian nutritionist (RDN) may have recommended this diet because you are having difficulty eating enough calories throughout the day, you have lost weight, and/or you need to add protein to your diet. Sometimes you may not feel like eating, even if you know the importance of good nutrition. The recommendations in this handout can help you with the following: Regaining your strength and energy Keeping your body healthy Healing and recovering from surgery or illness and fighting infection  Tips: Schedule Your Meals and Snacks Several small meals and snacks are often better tolerated and digested than large meals. Strategies Plan to eat 3 meals and 3 snacks daily. Experiment with timing meals to find out when you have a larger appetite. Appetite may be greatest in the morning after not eating all night so you may prefer to eat your larger meals and snacks in  the morning and at lunch. Breakfast-type foods are often better tolerated so eat foods such as eggs, pancakes, waffles and cereal for any meal or snack. Carry snacks with you so you are prepared to eat every 2 to 3 hours. Determine what works best for you if your body's cues for feeling hungry or full are not working. Eat a  small meal or snack even if you don't feel hungry. Set a timer to remind you when it is time to eat. Take a walk before you eat (with health care provider's approval). Light or moderate physical activity can help you maintain muscle and increase your appetite.  Make Eating Enjoyable Taking steps to make the experience enjoyable may help to increase your interest in eating and improve your appetite. Strategies: Eat with others whenever possible. Include your favorite foods to make meals more enjoyable. Try new foods. Save your beverage for the end of the meal so that you have more room for food before you get full.  Add Calories to Your Meals and Snacks Try adding calorie-dense foods so that each bite provides more nutrition. Strategies Drink milk, chocolate milk, soy milk, or smoothies instead of low-calorie beverages such as diet drinks or water. Cook with milk or soy milk instead of water when making dishes such as hot cereal, cocoa, or pudding. Add jelly, jam, honey, butter or margarine to bread and crackers. Add jam or fruit to ice cream and as a topping over cake. Mix dried fruit, nuts, granola, honey, or dry cereal with yogurt or hot cereals. Enjoy snacks such as milkshakes, smoothies, pudding, ice cream, or custard. Blend a fruit smoothie of a banana, frozen berries, milk or soy milk, and 1 tablespoon nonfat powdered milk or protein powder.  Add Protein to Your Meals and Snacks Choose at least one protein food at each meal and snack to increase your daily intake. Strategies Add  cup nonfat dry milk powder or protein powder to make a high-protein milk to drink or to use in recipes that call for milk. Vanilla or peppermint extract or unsweetened cocoa powder could help to boost the flavor. Add hard-cooked eggs, leftover meat, grated cheese, canned beans or tofu to noodles, rice, salads, sandwiches, soups, casseroles, pasta, tuna and other mixed dishes. Add powdered milk or protein  powder to hot cereals, meatloaf, casseroles, scrambled eggs, sauces, cream soups, and shakes. Add beans and lentils to salads, soups, casseroles, and vegetable dishes. Eat cottage cheese or yogurt, especially Greek yogurt, with fruit as a snack or dessert. Eat peanut or other nut butters on crackers, bread, toast, waffles, apples, bananas or celery sticks. Add it to milkshakes, smoothies, or desserts. Consider a ready-made protein shake. Your RDN will make recommendations.  Add Fats to Your Meals and Snacks Try adding fats to your meals and snacks. Fat provides more calories in fewer bites than carbohydrate or protein and adds flavors to your foods. Strategies Snack on nuts and seeds or add them to foods like salads, pasta, cereals, yogurt, and ice cream.  Saut or stir-fry vegetables, meats, chicken, fish or tofu in olive or canola oil.  Add olive oil, other vegetable oils, butter or margarine to soups, vegetables, potatoes, cooked cereal, rice, pasta, bread, crackers, pancakes, or waffles. Snack on olives or add to pasta, pizza, or salad. Add avocado or guacamole to your salads, sandwiches, and other entrees. Include fatty fish such as salmon in your weekly meal plan. For general food safety tips, especially for clients with immunocompromised conditions, ask  your RDN for the Food Safety Nutrition Therapy handout. Small Meal and Snack Ideas These snacks and meals are recommended when you have to eat but aren't necessarily hungry.  They are good choices because they are high in protein and high in calories.  2 graham crackers 2 tablespoons peanut or other nut butter 1 cup milk 2 slices whole wheat toast topped with:  avocado, mashed Seasoning of your choice   cup Greek yogurt  cup fruit  cup granola 2 deviled egg halves 5 whole wheat crackers  1 cup cream of tomato soup  grilled cheese sandwich 1 toasted waffle topped with: 2 tablespoons peanut or nut butter 1 tablespoon jam   Trail mix made with:  cup nuts  cup dried fruit  cup cold cereal, any variety  cup oatmeal or cream of wheat cereal 1 tablespoon peanut or nut butter  cup diced fruit   High-Calorie, High-Protein Sample 1-Day Menu View Nutrient Info Breakfast 1 egg, scrambled 1 ounce cheddar cheese 1 English muffin, whole wheat 1 tablespoon margarine 1 tablespoon jam  cup orange juice, fortified with calcium and vitamin D  Morning Snack 1 tablespoon peanut butter 1 banana 1 cup 1% milk  Lunch Tuna salad sandwich made with: 2 slices bread, whole wheat 3 ounces tuna mixed with: 1 tablespoon mayonnaise  cup pudding  Afternoon Snack  cup hummus  cup carrots 1 pita  Evening Meal Enchilada casserole made with: 2 corn tortillas 3 ounces ground beef, cooked  cup black beans, cooked  cup corn, cooked 1 ounce grated cheddar cheese  cup enchilada sauce  avocado, sliced, topping for enchilada 1 tablespoon sour cream, topping for enchilada Salad:  cup lettuce, shredded  cup tomatoes, chopped, for salad 1 tablespoon olive oil and vinegar dressing, for salad  Evening Snack  cup Greek yogurt  cup blueberries  cup granola

## 2022-08-09 NOTE — Progress Notes (Signed)
  Transition of Care [facility]) Screening Note   Patient Details  Name: SHEVELLE SMITHER Date of Birth: [birth year]   Transition of [facility]) CM/SW Contact:    Cyndi Bender, RN Phone Number: 08/09/2022, 8:41 AM    Transition of Care Department [facility]) has reviewed patient and no TOC needs have been identified at this time. We will continue to monitor patient advancement through interdisciplinary progression rounds. If new patient transition needs arise, please place a TOC consult.

## 2022-08-10 DIAGNOSIS — N1832 Chronic kidney disease, stage 3b: Secondary | ICD-10-CM | POA: Diagnosis not present

## 2022-08-10 DIAGNOSIS — R197 Diarrhea, unspecified: Secondary | ICD-10-CM | POA: Diagnosis not present

## 2022-08-10 DIAGNOSIS — Z9884 Bariatric surgery status: Secondary | ICD-10-CM | POA: Diagnosis not present

## 2022-08-10 DIAGNOSIS — E162 Hypoglycemia, unspecified: Secondary | ICD-10-CM | POA: Diagnosis not present

## 2022-08-10 LAB — COMPREHENSIVE METABOLIC PANEL
ALT: 46 U/L — ABNORMAL HIGH (ref 0–44)
AST: 44 U/L — ABNORMAL HIGH (ref 15–41)
Albumin: 3 g/dL — ABNORMAL LOW (ref 3.5–5.0)
Alkaline Phosphatase: 129 U/L — ABNORMAL HIGH (ref 38–126)
Anion gap: 7 (ref 5–15)
BUN: 13 mg/dL (ref 8–23)
CO2: 28 mmol/L (ref 22–32)
Calcium: 8.7 mg/dL — ABNORMAL LOW (ref 8.9–10.3)
Chloride: 105 mmol/L (ref 98–111)
Creatinine, Ser: 1.35 mg/dL — ABNORMAL HIGH (ref 0.44–1.00)
GFR, Estimated: 43 mL/min — ABNORMAL LOW (ref >60)
Glucose, Bld: 81 mg/dL (ref 70–99)
Potassium: 4.2 mmol/L (ref 3.5–5.1)
Sodium: 140 mmol/L (ref 135–145)
Total Bilirubin: 0.4 mg/dL (ref 0.3–1.2)
Total Protein: 6 g/dL — ABNORMAL LOW (ref 6.5–8.1)

## 2022-08-10 LAB — PHOSPHORUS: Phosphorus: 4.7 mg/dL — ABNORMAL HIGH (ref 2.5–4.6)

## 2022-08-10 LAB — CBC
HCT: 33.8 % — ABNORMAL LOW (ref 36.0–46.0)
Hemoglobin: 10.8 g/dL — ABNORMAL LOW (ref 12.0–15.0)
MCH: 27.3 pg (ref 26.0–34.0)
MCHC: 32 g/dL (ref 30.0–36.0)
MCV: 85.6 fL (ref 80.0–100.0)
Platelets: 221 10*3/uL (ref 150–400)
RBC: 3.95 MIL/uL (ref 3.87–5.11)
RDW: 13.6 % (ref 11.5–15.5)
WBC: 7.9 10*3/uL (ref 4.0–10.5)
nRBC: 0 % (ref 0.0–0.2)

## 2022-08-10 LAB — GLUCOSE, CAPILLARY
Glucose-Capillary: 103 mg/dL — ABNORMAL HIGH (ref 70–99)
Glucose-Capillary: 117 mg/dL — ABNORMAL HIGH (ref 70–99)
Glucose-Capillary: 188 mg/dL — ABNORMAL HIGH (ref 70–99)
Glucose-Capillary: 207 mg/dL — ABNORMAL HIGH (ref 70–99)
Glucose-Capillary: 49 mg/dL — ABNORMAL LOW (ref 70–99)
Glucose-Capillary: 58 mg/dL — ABNORMAL LOW (ref 70–99)
Glucose-Capillary: 62 mg/dL — ABNORMAL LOW (ref 70–99)
Glucose-Capillary: 67 mg/dL — ABNORMAL LOW (ref 70–99)
Glucose-Capillary: 73 mg/dL (ref 70–99)
Glucose-Capillary: 79 mg/dL (ref 70–99)

## 2022-08-10 LAB — MAGNESIUM: Magnesium: 2 mg/dL (ref 1.7–2.4)

## 2022-08-10 MED ORDER — ADULT MULTIVITAMIN W/MINERALS CH
1.0000 | ORAL_TABLET | Freq: Two times a day (BID) | ORAL | Status: DC
  Administered 2022-08-10 – 2022-08-14 (×8): 1 via ORAL
  Filled 2022-08-10 (×8): qty 1

## 2022-08-10 MED ORDER — DEXTROSE 10 % IV SOLN: INTRAVENOUS | Status: DC

## 2022-08-10 MED ORDER — ENSURE ENLIVE PO LIQD: 237.0000 mL | Freq: Two times a day (BID) | ORAL | Status: DC

## 2022-08-10 MED ORDER — LACTATED RINGERS IV SOLN: INTRAVENOUS | Status: DC

## 2022-08-10 MED ORDER — ENSURE ENLIVE PO LIQD
237.0000 mL | Freq: Two times a day (BID) | ORAL | Status: DC
  Administered 2022-08-10 – 2022-08-12 (×4): 237 mL via ORAL

## 2022-08-10 MED ORDER — BOOST / RESOURCE BREEZE PO LIQD CUSTOM
1.0000 | Freq: Three times a day (TID) | ORAL | Status: DC
  Administered 2022-08-11 – 2022-08-14 (×5): 1 via ORAL

## 2022-08-10 MED ORDER — CALCIUM CARBONATE ANTACID 500 MG PO CHEW
200.0000 mg | CHEWABLE_TABLET | Freq: Three times a day (TID) | ORAL | Status: DC
  Administered 2022-08-10 – 2022-08-14 (×13): 200 mg via ORAL
  Filled 2022-08-10 (×13): qty 1

## 2022-08-10 NOTE — Progress Notes (Addendum)
Initial Nutrition Assessment  DOCUMENTATION CODES:   Non-severe (moderate) malnutrition in context of chronic illness  INTERVENTION:  Boost Breeze po TID, each supplement provides 250 kcal and 9 grams of protein Ensure Plus High Protein po QHS, each supplement provides 350 kcal and 20 grams of protein. Magic cup TID with meals, each supplement provides 290 kcal and 9 grams of protein MVI with minerals BID TUMS '500mg'$  TID Snack QHS "High Calorie, High protein Nutrition Therapy" handout added to AVS   NUTRITION DIAGNOSIS:   Moderate Malnutrition related to chronic illness (DM, gastric bypass, Lewy body syndrome) as evidenced by moderate fat depletion, moderate muscle depletion, severe muscle depletion.  GOAL:   Patient will meet greater than or equal to 90% of their needs  MONITOR:   PO intake, Supplement acceptance, Labs, Weight trends  REASON FOR ASSESSMENT:   Consult Assessment of nutrition requirement/status  ASSESSMENT:   Pt admitted with hypoglycemia. PMH significant for HTN, HLD, CAD, T2DM, arthritis, [name], thyroid nodules, MRSA.   Very pleasant patient. 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 Within the last year she states that her PO intake and appetite has decreased significantly which she attributes to her new diagnosis of [name] within this time frame. However upon review of chart, her husband reported she has had a poor appetite since having her gastric bypass surgery.   She recalls her typical intake consisting of 1 whole cinnamon raisin bagel with honey walnut cream cheese for breakfast. Her next meal may consist of 1 [place] thigh, squash and pasta salad. She may snack on 2 crackers with peanut butter daily. She keeps a bowl of Redhot candies which she grabs a handful constantly throughout the day and suspects this is what has kept her blood sugars up. She drinks 1 12oz Pepsi over a 2 day span, in addition to sweet tea daily. When her  granddaughter comes to visit, she will make them alcohol free pina coladas. She has Boost at home but had not been drinking them regularly. She does enjoy them and encouraged her to continue to drink these daily.   She does not use insulin at home for her diabetes. States that she typically has hypoglycemia in the morning. Reports her last snack is around 6P. Discussed with her incorporating a carb/protein containing snack later before bed to see if this helps with morning blood sugars. She has continued to take MVIs and calcium at home as part of her gastric bypass surgery.   Meal completions: 8/24: 50%-lunch  Weight down from 140 lbs to 124 lbs within the last year. Per review of weight history, it appears that her weight has remained stable within the last year between 55-57 kg.   She endorses frequent daily episodes of diarrhea within the last month however endorses improvements since admission. Question contribution of malabsorption d/t gastric bypass. MD checking micronutrient labs.  Medications: calcitriol, Vitamin B12, remeron, protonix, zinc (started 8/24), IV thiamine q8h  Labs: Cr 1.35, phos 4.7, alkaline phosphatase 129, lipase 54, AST 44, ALT 46, GFR 43  I/O's: +1724m since admission  NUTRITION - FOCUSED PHYSICAL EXAM:  Flowsheet Row Most Recent Value  Orbital Region Moderate depletion  Upper Arm Region No depletion  Thoracic and Lumbar Region Moderate depletion  Buccal Region Moderate depletion  Temple Region Moderate depletion  Clavicle Bone Region Severe depletion  Clavicle and Acromion Bone Region Severe depletion  Scapular Bone Region Severe depletion  Dorsal Hand Moderate depletion  Patellar Region Moderate depletion  Anterior Thigh Region Moderate depletion  Posterior Calf Region No depletion  Edema (RD Assessment) None  Hair Reviewed  Eyes Reviewed  Mouth Reviewed  Skin Reviewed  Nails Reviewed      Diet Order:   Diet Order             Diet regular  Room service appropriate? Yes; Fluid consistency: Thin  Diet effective now           Diet - low sodium heart healthy                   EDUCATION NEEDS:   Education needs have been addressed  Skin:  Skin Assessment: Reviewed RN Assessment  Last BM:  8/23  Height:   Ht Readings from Last 1 Encounters:  08/08/22 '5\' 3"'$  (1.6 m)    Weight:   Wt Readings from Last 1 Encounters:  08/08/22 56.8 kg   BMI:  Body mass index is 22.18 kg/m.  Estimated Nutritional Needs:   Kcal:  1600-1800  Protein:  80-95g  Fluid:  >/=1.6L  Clayborne Dana, RDN, LDN Clinical Nutrition

## 2022-08-10 NOTE — Progress Notes (Signed)
Hypoglycemic Event  CBG: 49  Treatment: D50 50 mL (25 gm)  Symptoms:  pale, shaky, unresponsive to tactile stimuli  Follow-up CBG: Time:1553 CBG Result:207  Possible Reasons for Event: Inadequate meal intake  Comments/MD notified:Elgergawy Dawood, started D10 at 32m/hr     SRexanne Mano

## 2022-08-10 NOTE — TOC Initial Note (Signed)
Transition of Care [facility]) - Initial/Assessment Note    Patient Details  Name: Julie Acevedo MRN: [id number] Date of Birth: 03-16-1951  [facility] CM/SW Contact:    Pollie Friar, RN Phone Number: 08/10/2022, 12:32 PM  Clinical Narrative:                 Pt is from home with her spouse. She drives self as needed but spouse can provide transportation.  Pt manges her own medications and denies any issues.  No f/u per PT.  TOC following.  Expected Discharge Plan: Home/Self Care Barriers to Discharge: Continued Medical Work up   Patient Goals and CMS Choice        Expected Discharge Plan and Services Expected Discharge Plan: Home/Self Care       Living arrangements for the past 2 months: Single Family Home Expected Discharge Date: 08/09/22                                    Prior Living Arrangements/Services Living arrangements for the past 2 months: Single Family Home Lives with:: Spouse Patient language and need for interpreter reviewed:: Yes Do you feel safe going back to the place where you live?: Yes        Care giver support system in place?: Yes (comment) Current home services: DME (cane/ walker/ wheelchair/ shower seat) Criminal Activity/Legal Involvement Pertinent to Current Situation/Hospitalization: No - Comment as needed  Activities of Daily Living Home Assistive Devices/Equipment: None ADL Screening (condition at time of admission) Patient's cognitive ability adequate to safely complete daily activities?: Yes Is the patient deaf or have difficulty hearing?: No Does the patient have difficulty seeing, even when wearing glasses/contacts?: No Does the patient have difficulty concentrating, remembering, or making decisions?: No Patient able to express need for assistance with ADLs?: Yes Does the patient have difficulty dressing or bathing?: No Independently performs ADLs?: Yes (appropriate for developmental age) Communication:  Independent Dressing (OT): Independent Grooming: Independent Feeding: Independent Bathing: Independent Toileting: Independent In/Out Bed: Independent Walks in Home: Independent Does the patient have difficulty walking or climbing stairs?: No Weakness of Legs: None Weakness of Arms/Hands: None  Permission Sought/Granted                  Emotional Assessment Appearance:: Appears stated age Attitude/Demeanor/Rapport: Engaged Affect (typically observed): Accepting Orientation: : Oriented to Self, Oriented to Place, Oriented to  Time, Oriented to Situation   Psych Involvement: No (comment)  Admission diagnosis:  Hypoglycemia [E16.2] Patient Active Problem List   Diagnosis Date Noted   Hypoglycemia 08/08/2022   Diarrhea 08/08/2022   Lower abdominal pain 08/08/2022   UTI (urinary tract infection) 08/08/2022   Elevated lipase 08/08/2022   Transaminitis 08/08/2022   Hypoalbuminemia 08/08/2022   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, psychotic disturbance, mood disturbance, or anxiety ([place]) 11/22/2021   TIA (transient ischemic attack) 09/05/2021   Stomatitis and mucositis 07/21/2021   Cheilitis 07/21/2021   Proteinuria, unspecified 05/04/2021   Stage 3b chronic kidney disease ([place]) 05/04/2021   Anemia in chronic kidney disease 05/04/2021   Hypokalemia 05/04/2021   Other and unspecified hyperlipidemia 03/17/2021   Callus of foot 01/19/2021   Pes planus 01/19/2021   PTTD (posterior tibial tendon dysfunction) 01/19/2021   Acute flank pain 12/15/2020   Cystitis 12/15/2020   Urinary symptom or sign 12/15/2020   Low back pain radiating to right lower extremity 06/23/2020  Gastroesophageal reflux disease 04/28/2020   Large bowel obstruction ([place]) 04/17/2019   Acquired trigger finger 03/20/2019   Iron deficiency anemia 11/21/2018   Syncope 03/18/2018   Anemia 03/18/2018   Exophthalmos 02/22/2018   Multiple thyroid nodules 02/22/2018   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   Pain at surgical site 08/30/2016   Ventral incisional hernia 08/21/2016   SBO (small bowel obstruction) (HCC)    Partial small bowel obstruction ([place]) 04/23/2016   Adjustment disorder with mixed anxiety and depressed mood 04/23/2016   Memory difficulties 09/01/2015   Arthritis 06/11/2015   Back pain, chronic 06/11/2015   HLD (hyperlipidemia) 06/11/2015   Cannot sleep 06/11/2015   L-S radiculopathy 06/11/2015   Arthritis of knee, degenerative 06/11/2015   B12 deficiency 06/11/2015   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 [place] [address] Phone: [phone] Fax: [phone]  [id number] [place] [id number]: [phone]: [phone]    Social Determinants of Health (SDOH) Interventions    Readmission Risk Interventions     No data to display

## 2022-08-10 NOTE — Plan of Care (Signed)
  Problem: Coping: Goal: Ability to adjust to condition or change in health will improve Outcome: Progressing   Problem: Fluid Volume: Goal: Ability to maintain a balanced intake and output will improve Outcome: Progressing   Problem: Health Behavior/Discharge Planning: Goal: Ability to identify and utilize available resources and services will improve Outcome: Progressing Goal: Ability to manage health-related needs will improve Outcome: Progressing   Problem: Clinical Measurements: Goal: Respiratory complications will improve Outcome: Progressing   Problem: Nutrition: Goal: Adequate nutrition will be maintained Outcome: Progressing

## 2022-08-10 NOTE — Progress Notes (Signed)
Hypoglycemic Event  CBG: 58  Treatment: 4 oz juice/soda 1 amp  Symptoms: None  Follow-up CBG: [id number] CBG Result:188  Possible Reasons for Event: Inadequate meal intake  Comments/MD notified:Elgergawy Lattie Haw

## 2022-08-10 NOTE — Plan of Care (Signed)
  Problem: Education: Goal: Ability to describe self-care measures that may prevent or decrease complications (Diabetes Survival Skills Education) will improve Outcome: Progressing   Problem: Fluid Volume: Goal: Ability to maintain a balanced intake and output will improve Outcome: Progressing   Problem: Nutritional: Goal: Maintenance of adequate nutrition will improve Outcome: Progressing

## 2022-08-10 NOTE — Progress Notes (Signed)
PROGRESS NOTE    Julie Acevedo  CWC:[id number] DOB: Jun 11, 1951 DOA: 08/08/2022 PCP: Jerrol Banana., MD   Chief Complaint  Patient presents with   Hypoglycemia    Brief Narrative:   Julie Acevedo is a 71 y.o. female with medical history significant of hypertension, hyperlipidemia, CAD, DM type II, arthritis, [name], thyroid nodules, and history of MRSA who presents due to low blood sugar.  Patient recalls going to bed and not feeling well.  She was awoken out of her sleep due to feeling her heart racing.  She got up because it felt like her heart was beating out of her chest, and reported that she got very sweaty.  He got up and sat down on the couch in the living room. Denied having any chest pain at the time.  She called 911 due to her symptoms and the next thing she recalls is being here at the hospital.  Patient reports that she has been having intermittent low blood sugars and had previously been hospitalized for the same.  She is not on insulin and only takes metformin if her blood sugars are elevated.  Furthermore, patient notes that over the last week she has been having persistent diarrhea.  Denies having any blood in stools.  Notes associated symptoms of lower abdominal soreness, nausea, some trace lower extremity swelling, urinary frequency, headaches, and neck discomfort.  Patient reports that she normally does not get headaches, but has been having them more frequently.  She reports having decreased range of motion when turning her neck to the right side and pain radiating down her neck.  Denies any recent trauma or falls to onset of the symptoms.   Her husband states that she has been having a poor appetite since she had gastric bypass.  He makes note that she drinks fluids, but does not eat appropriately.   Upon the fire department arrival patient was noted to be unresponsive with CBG 58.  Patient was given D10 with IV placed with improvement in glucose to 173.   In  the emergency department patient vital signs were noted to be stable.  Labs noted hemoglobin 10.2, potassium 3.3, BUN 14, creatinine 1.37, lipase 54, AST 43, and ALT 45.  Urinalysis noted moderate leukocytes with no bacteria seen and 0-5 WBCs.  Patient has been given amp of D50, but blood sugars dropped again as low as 60 while in the ED.  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 -Vitamin D is reassuring . -B12 was low earlier in the year, has improved, but for now we will continue with IM supplements . -Zinc, copper and B1 is pending  -Given memory issues and work-up for Louis body dementia as an outpatient we will continue with IV thiamine for now at 400 mg IV every 8 hours.  Diarrhea -With concern of malabsorption syndrome  -Started on zinc, continue with supplements pending results    anemia of chronic kidney disease Hemoglobin 10.2 g/dL, but appears to be around patient's baseline.  Patient denies any reports of bleeding  Possible UTI Patient reports having increased urinary frequency recently.  Urinalysis noted moderate leukocytes, negative nitrites, no bacteria seen, and 0-5 WBCs. -Check urine culture   Hypokalemia -Repleted, will give IV magnesium as well   Essential hypertension Blood pressures anywhere from 124/63 -164/74.  Home blood pressure regimen includes metoprolol 25 mg daily. -Continue metoprolol with holding parameters   [name] Patient appears to be alert and oriented x4 at this time. -Delirium precautions -Continue outpatient follow-up with neurology   Elevated lipase  transaminitis Acute.     Labs noted lipase 53, AST 43, ALT 45. -Continue to trend    Hyperlipidemia Home medication regimen includes atorvastatin 40 mg daily. -Continue statin    Hypoalbuminemia Acute.  Albumin 3.2. -Check prealbumin -Boost shakes in between meals   GERD -Continue pharmacy substitution of Protonix   DVT prophylaxis: Lovenox Code Status: Full Family Communication: daughter at bedside 8/24 Disposition:   Status is: Inpatient   Consultants:  none   Subjective:  She reports generalized weakness, fatigue, appetite remains poor, she had hypoglycemic episode this morning requiring D50 push  Objective: Vitals:   08/09/22 2051 08/09/22 2352 08/10/22 0419 08/10/22 0833  BP: 123/61 130/60 134/64 (!) 125/58  Pulse: 60 (!) 57 (!) 53 64  Resp: '20 16 13 16  '$ Temp: 98.2 F (36.8 C) 97.8 F (36.6 C) 97.8 F (36.6 C) 98 F (36.7 C)  TempSrc: Oral Oral Oral Oral  SpO2: 100% 99% 100% 97%  Weight:      Height:        Intake/Output Summary (Last 24 hours) at 08/10/2022 1430 Last data filed at 08/09/2022 2100 Gross per 24 hour  Intake 150 ml  Output --  Net 150 ml   Filed Weights   08/08/22 0024 08/08/22 2014  Weight: 56.1 kg 56.8  kg    Examination:  Awake Alert, Oriented X 3, frail, deconditioned, thin appearing Symmetrical Chest wall movement, Good air movement bilaterally, CTAB RRR,No Gallops,Rubs or new Murmurs, No Parasternal Heave +ve B.Sounds, Abd Soft, No tenderness, No rebound - guarding or rigidity. No Cyanosis, Clubbing or edema, No new Rash or bruise        Data Reviewed: I have personally reviewed following labs and imaging studies  CBC: Recent Labs  Lab 08/08/22 0115 08/09/22 0404 08/10/22 0400  WBC 7.9 6.8 7.9  NEUTROABS 5.5  --   --   HGB 10.2* 9.6* 10.8*  HCT 32.5* 30.2* 33.8*  MCV 86.7 84.8 85.6  PLT 224 193 540    Basic Metabolic Panel: Recent Labs  Lab 08/08/22 0115 08/08/22 1242 08/09/22 0404 08/10/22 0400  NA 141  --  138 140  K 3.3*  --  3.6 4.2  CL 107  --  106 105  CO2 29  --  27 28  GLUCOSE 113*  --  129* 81  BUN 14  --  6* 13  CREATININE 1.37*  --  1.21* 1.35*  CALCIUM 9.2  --  8.6* 8.7*  MG  --  1.7  --  2.0  PHOS  --   --   --  4.7*    GFR: Estimated Creatinine Clearance: 32.5 mL/min (A) (by C-G formula based on SCr of 1.35 mg/dL (H)).  Liver Function Tests: Recent Labs  Lab 08/08/22 0115 08/10/22 0400  AST 43* 44*  ALT 45* 46*  ALKPHOS 123 129*  BILITOT 0.2* 0.4  PROT 6.0* 6.0*  ALBUMIN 3.2* 3.0*    CBG: Recent Labs  Lab 08/10/22 0434 08/10/22 0853 08/10/22 1112 08/10/22 1130 08/10/22 1154  GLUCAP 73 62* 58* 67* 188*     No results found for this or any previous visit (from the past 240 hour(s)).       Radiology Studies: No results found.      Scheduled Meds:  atorvastatin  40 mg Oral Daily   calcitRIOL  0.25 mcg Oral Daily   cyanocobalamin  1,000 mcg Intramuscular Daily   enoxaparin (LOVENOX) injection  40 mg Subcutaneous Q24H   feeding supplement  1 Container Oral TID BM   feeding supplement  237 mL Oral BID BM   gabapentin  300 mg Oral TID   hydrOXYzine  100 mg Oral TID   metoprolol succinate  25 mg Oral Daily    mirtazapine  15 mg Oral QHS   pantoprazole  40 mg Oral Daily   sodium chloride flush  3 mL Intravenous Q12H   zinc sulfate  220 mg Oral BID   Continuous Infusions:  thiamine (VITAMIN B1) injection 500 mg (08/10/22 1408)     LOS: 1 day       Phillips Climes, MD [facility]   To contact the attending provider between 7A-7P or the covering provider during after hours 7P-7A, please log into the web site [email or web address] and access using universal [place] password for that web site. If you do not have the password, please call the hospital operator.  08/10/2022, 2:30 PM

## 2022-08-11 ENCOUNTER — Inpatient Hospital Stay (HOSPITAL_COMMUNITY): Payer: Medicare Other

## 2022-08-11 DIAGNOSIS — E440 Moderate protein-calorie malnutrition: Secondary | ICD-10-CM | POA: Insufficient documentation

## 2022-08-11 DIAGNOSIS — R197 Diarrhea, unspecified: Secondary | ICD-10-CM | POA: Diagnosis not present

## 2022-08-11 DIAGNOSIS — I10 Essential (primary) hypertension: Secondary | ICD-10-CM | POA: Diagnosis not present

## 2022-08-11 DIAGNOSIS — E162 Hypoglycemia, unspecified: Secondary | ICD-10-CM | POA: Diagnosis not present

## 2022-08-11 LAB — VITAMIN B1: Vitamin B1 (Thiamine): 88.3 nmol/L (ref 66.5–200.0)

## 2022-08-11 LAB — GLUCOSE, CAPILLARY
Glucose-Capillary: 118 mg/dL — ABNORMAL HIGH (ref 70–99)
Glucose-Capillary: 143 mg/dL — ABNORMAL HIGH (ref 70–99)
Glucose-Capillary: 75 mg/dL (ref 70–99)
Glucose-Capillary: 175 mg/dL — ABNORMAL HIGH (ref 70–99)
Glucose-Capillary: 99 mg/dL (ref 70–99)

## 2022-08-11 LAB — CORTISOL: Cortisol, Plasma: 10.9 ug/dL

## 2022-08-11 LAB — PHOSPHORUS: Phosphorus: 4.7 mg/dL — ABNORMAL HIGH (ref 2.5–4.6)

## 2022-08-11 LAB — BASIC METABOLIC PANEL
Anion gap: 5 (ref 5–15)
BUN: 17 mg/dL (ref 8–23)
CO2: 30 mmol/L (ref 22–32)
Calcium: 8.9 mg/dL (ref 8.9–10.3)
Chloride: 107 mmol/L (ref 98–111)
Creatinine, Ser: 1.49 mg/dL — ABNORMAL HIGH (ref 0.44–1.00)
GFR, Estimated: 38 mL/min — ABNORMAL LOW (ref >60)
Glucose, Bld: 102 mg/dL — ABNORMAL HIGH (ref 70–99)
Potassium: 4.2 mmol/L (ref 3.5–5.1)
Sodium: 142 mmol/L (ref 135–145)

## 2022-08-11 MED ORDER — COSYNTROPIN 0.25 MG IJ SOLR
0.2500 mg | Freq: Once | INTRAMUSCULAR | Status: AC
  Administered 2022-08-12: 0.25 mg via INTRAVENOUS
  Filled 2022-08-11: qty 0.25

## 2022-08-11 MED ORDER — BISACODYL 5 MG PO TBEC
10.0000 mg | DELAYED_RELEASE_TABLET | Freq: Every day | ORAL | Status: DC
  Administered 2022-08-11: 10 mg via ORAL
  Filled 2022-08-11 (×3): qty 2

## 2022-08-11 MED ORDER — POLYETHYLENE GLYCOL 3350 17 G PO PACK
17.0000 g | PACK | Freq: Two times a day (BID) | ORAL | Status: DC
  Administered 2022-08-11: 17 g via ORAL
  Filled 2022-08-11 (×4): qty 1

## 2022-08-11 MED ORDER — THIAMINE HCL 100 MG/ML IJ SOLN
500.0000 mg | INTRAMUSCULAR | Status: DC
Start: 2022-08-12 — End: 2022-08-12
  Filled 2022-08-11: qty 5

## 2022-08-11 MED ORDER — BARIUM SULFATE 2 % PO SUSP
ORAL | Status: AC
  Administered 2022-08-11: 1 mL
  Filled 2022-08-11: qty 2

## 2022-08-11 NOTE — Progress Notes (Signed)
PROGRESS NOTE    FUJIE Acevedo  JJH:[id number] DOB: 04/19/1951 DOA: 08/08/2022 PCP: Jerrol Banana., MD   Chief Complaint  Patient presents with   Hypoglycemia    Brief Narrative:   Julie Acevedo is a 71 y.o. female with medical history significant of hypertension, hyperlipidemia, CAD, DM type II, arthritis, [name], thyroid nodules, and history of MRSA who presents due to low blood sugar.  Patient recalls going to bed and not feeling well.  She was awoken out of her sleep due to feeling her heart racing.  She got up because it felt like her heart was beating out of her chest, and reported that she got very sweaty.  He got up and sat down on the couch in the living room. Denied having any chest pain at the time.  She called 911 due to her symptoms and the next thing she recalls is being here at the hospital.  Patient reports that she has been having intermittent low blood sugars and had previously been hospitalized for the same.  She is not on insulin and only takes metformin if her blood sugars are elevated.  Furthermore, patient notes that over the last week she has been having persistent diarrhea.  Denies having any blood in stools.  Notes associated symptoms of lower abdominal soreness, nausea, some trace lower extremity swelling, urinary frequency, headaches, and neck discomfort.  Patient reports that she normally does not get headaches, but has been having them more frequently.  She reports having decreased range of motion when turning her neck to the right side and pain radiating down her neck.  Denies any recent trauma or falls to onset of the symptoms.   Her husband states that she has been having a poor appetite since she had gastric bypass.  He makes note that she drinks fluids, but does not eat appropriately.   Upon the fire department arrival patient was noted to be unresponsive with CBG 58.  Patient was given D10 with IV placed with improvement in glucose to 173.   In  the emergency department patient vital signs were noted to be stable.  Labs noted hemoglobin 10.2, potassium 3.3, BUN 14, creatinine 1.37, lipase 54, AST 43, and ALT 45.  Urinalysis noted moderate leukocytes with no bacteria seen and 0-5 WBCs.  Patient has been given amp of D50, but blood sugars dropped again as low as 60 while in the ED.  Patient was placed on D5 IV fluids at 125 mL/h    Assessment & Plan:   Principal Problem:   Hypoglycemia Active Problems:   Diarrhea   Lower abdominal pain   Anemia in chronic kidney disease   UTI (urinary tract infection)   Hypokalemia   Benign essential HTN   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, psychotic disturbance, mood disturbance, or anxiety (HCC)   Elevated lipase   Transaminitis   Gastric bypass status for obesity   Gastroesophageal reflux disease   Hypoalbuminemia   Malnutrition of moderate degree   Diabetes mellitus, type II, uncontrolled with hypoglycemia -Patient appears to be 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 -Attempt to decrease her D10W today. - nutritionist consulted about techniques to avoid hypoglycemia. 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 -Vitamin D is reassuring . -B12 was low earlier in the year, has improved, but for now we will continue with IM  supplements . -Zinc, copper and B1 is pending  -Given memory issues and work-up for Lewy's body dementia as an outpatient we will continue with IV thiamine.  Diarrhea -With concern of malabsorption syndrome  -Started on zinc, continue with supplements pending results   anemia of chronic kidney disease Hemoglobin 10.2 g/dL, but appears to be around patient's baseline.  Patient denies any reports of bleeding  Possible UTI Patient reports having increased urinary frequency recently.  Urinalysis noted moderate leukocytes, negative nitrites, no bacteria seen, and 0-5 WBCs. -Check urine culture   Hypokalemia -Repleted, Essential hypertension Blood pressures anywhere from 124/63 -164/74.  Home blood pressure regimen includes metoprolol 25 mg daily. -Continue metoprolol with holding parameters   [name] Patient appears to be alert and oriented x4 at this time. -Delirium precautions -Continue outpatient follow-up with neurology   Elevated lipase  transaminitis Acute.     Labs noted lipase 53, AST 43, ALT 45. -Continue to trend  Hyperlipidemia Home medication regimen includes atorvastatin 40 mg daily. -Continue statin    Hypoalbuminemia Acute.  Albumin 3.2. -Check prealbumin -Boost shakes in between meals   GERD -Continue pharmacy substitution of Protonix   DVT prophylaxis: Lovenox Code Status: Full Family Communication: None at bedside Disposition:   Status is: Inpatient   Consultants:  none   Subjective:  She remains with poor appetite, generalized weakness and fatigue .   Objective: Vitals:   08/10/22 1933 08/10/22 2350 08/11/22 0352 08/11/22 0753  BP: (!) 117/58 (!) 124/57 (!) 123/59 119/65  Pulse: 60 (!) 58 (!) 53 (!) 59  Resp: '17 14 14 14  '$ Temp: 98 F (36.7 C) 97.7 F (36.5 C) 98.1 F (36.7 C) 98.3 F (36.8 C)  TempSrc: Oral Oral Oral Oral  SpO2: 100% 100% 100% 100%  Weight:      Height:       No intake or output data in the 24 hours  ending 08/11/22 1156  Filed Weights   08/08/22 0024 08/08/22 2014  Weight: 56.1 kg 56.8 kg    Examination:   Awake Alert, Oriented X 3, frail, deconditioned, thin appearing, easily distracted  Symmetrical Chest wall movement, Good air movement bilaterally, CTAB RRR,No Gallops,Rubs or new Murmurs, No Parasternal Heave +ve B.Sounds, Abd Soft, No tenderness, No rebound - guarding or rigidity. No Cyanosis, Clubbing or edema, No new Rash or bruise        Data Reviewed: I have personally reviewed following labs and imaging studies  CBC: Recent Labs  Lab 08/08/22 0115 08/09/22 0404 08/10/22 0400  WBC 7.9 6.8 7.9  NEUTROABS 5.5  --   --   HGB 10.2* 9.6* 10.8*  HCT 32.5* 30.2* 33.8*  MCV 86.7 84.8 85.6  PLT 224 193 409    Basic Metabolic Panel: Recent Labs  Lab 08/08/22 0115 08/08/22 1242 08/09/22 0404 08/10/22 0400 08/11/22 0617  NA 141  --  138 140 142  K 3.3*  --  3.6 4.2 4.2  CL 107  --  106 105 107  CO2 29  --  27 28 30  GLUCOSE 113*  --  129* 81 102*  BUN 14  --  6* 13 17  CREATININE 1.37*  --  1.21* 1.35* 1.49*  CALCIUM 9.2  --  8.6* 8.7* 8.9  MG  --  1.7  --  2.0  --   PHOS  --   --   --  4.7* 4.7*    GFR: Estimated Creatinine Clearance: 29.5 mL/min (A) (by C-G formula based on SCr of 1.49 mg/dL (H)).  Liver Function Tests: Recent Labs  Lab 08/08/22 0115 08/10/22 0400  AST 43* 44*  ALT 45* 46*  ALKPHOS 123 129*  BILITOT 0.2* 0.4  PROT 6.0* 6.0*  ALBUMIN 3.2* 3.0*    CBG: Recent Labs  Lab 08/10/22 1817 08/10/22 1947 08/10/22 2357 08/11/22 0358 08/11/22 1051  GLUCAP 117* 79 103* 118* 143*     No results found for this or any previous visit (from the past 240 hour(s)).       Radiology Studies: No results found.      Scheduled Meds:  atorvastatin  40 mg Oral Daily   calcitRIOL  0.25 mcg Oral Daily   calcium carbonate  200 mg of elemental calcium Oral TID   cyanocobalamin  1,000 mcg Intramuscular Daily   enoxaparin  (LOVENOX) injection  40 mg Subcutaneous Q24H   feeding supplement  1 Container Oral TID BM   feeding supplement  237 mL Oral BID BM   gabapentin  300 mg Oral TID   hydrOXYzine  100 mg Oral TID   metoprolol succinate  25 mg Oral Daily   mirtazapine  15 mg Oral QHS   multivitamin with minerals  1 tablet Oral BID   pantoprazole  40 mg Oral Daily   sodium chloride flush  3 mL Intravenous Q12H   zinc sulfate  220 mg Oral BID   Continuous Infusions:  dextrose 50 mL/hr at 08/10/22 1852   lactated ringers     thiamine (VITAMIN B1) injection 500 mg (08/11/22 0444)     LOS: 2 days       Phillips Climes, MD [facility]   To contact the attending provider between 7A-7P or the covering provider during after hours 7P-7A, please log into the web site [email or web address] and access using universal [place] password for that web site. If you do not have the password, please call the hospital operator.  08/11/2022, 11:56 AM

## 2022-08-12 ENCOUNTER — Inpatient Hospital Stay (HOSPITAL_COMMUNITY): Payer: Medicare Other

## 2022-08-12 DIAGNOSIS — E162 Hypoglycemia, unspecified: Secondary | ICD-10-CM | POA: Diagnosis not present

## 2022-08-12 DIAGNOSIS — R4182 Altered mental status, unspecified: Secondary | ICD-10-CM

## 2022-08-12 DIAGNOSIS — N1832 Chronic kidney disease, stage 3b: Secondary | ICD-10-CM | POA: Diagnosis not present

## 2022-08-12 DIAGNOSIS — D631 Anemia in chronic kidney disease: Secondary | ICD-10-CM | POA: Diagnosis not present

## 2022-08-12 DIAGNOSIS — Z9884 Bariatric surgery status: Secondary | ICD-10-CM | POA: Diagnosis not present

## 2022-08-12 LAB — CBC
HCT: 31.6 % — ABNORMAL LOW (ref 36.0–46.0)
Hemoglobin: 10.2 g/dL — ABNORMAL LOW (ref 12.0–15.0)
MCH: 27.1 pg (ref 26.0–34.0)
MCHC: 32.3 g/dL (ref 30.0–36.0)
MCV: 83.8 fL (ref 80.0–100.0)
Platelets: 236 10*3/uL (ref 150–400)
RBC: 3.77 MIL/uL — ABNORMAL LOW (ref 3.87–5.11)
RDW: 13.4 % (ref 11.5–15.5)
WBC: 6.9 10*3/uL (ref 4.0–10.5)
nRBC: 0 % (ref 0.0–0.2)

## 2022-08-12 LAB — PHOSPHORUS: Phosphorus: 4.4 mg/dL (ref 2.5–4.6)

## 2022-08-12 LAB — BASIC METABOLIC PANEL
Anion gap: 7 (ref 5–15)
BUN: 17 mg/dL (ref 8–23)
CO2: 28 mmol/L (ref 22–32)
Calcium: 8.7 mg/dL — ABNORMAL LOW (ref 8.9–10.3)
Chloride: 106 mmol/L (ref 98–111)
Creatinine, Ser: 1.53 mg/dL — ABNORMAL HIGH (ref 0.44–1.00)
GFR, Estimated: 37 mL/min — ABNORMAL LOW (ref >60)
Glucose, Bld: 80 mg/dL (ref 70–99)
Potassium: 4 mmol/L (ref 3.5–5.1)
Sodium: 141 mmol/L (ref 135–145)

## 2022-08-12 LAB — ACTH STIMULATION, 3 TIME POINTS
Cortisol, 30 Min: 19.1 ug/dL
Cortisol, 60 Min: 25.3 ug/dL
Cortisol, Base: 11.3 ug/dL

## 2022-08-12 LAB — GLUCOSE, CAPILLARY
Glucose-Capillary: 104 mg/dL — ABNORMAL HIGH (ref 70–99)
Glucose-Capillary: 158 mg/dL — ABNORMAL HIGH (ref 70–99)
Glucose-Capillary: 69 mg/dL — ABNORMAL LOW (ref 70–99)
Glucose-Capillary: 252 mg/dL — ABNORMAL HIGH (ref 70–99)
Glucose-Capillary: 87 mg/dL (ref 70–99)
Glucose-Capillary: 167 mg/dL — ABNORMAL HIGH (ref 70–99)

## 2022-08-12 LAB — MAGNESIUM: Magnesium: 2 mg/dL (ref 1.7–2.4)

## 2022-08-12 MED ORDER — MIRTAZAPINE 15 MG PO TABS
30.0000 mg | ORAL_TABLET | Freq: Every day | ORAL | Status: DC
  Administered 2022-08-12 – 2022-08-13 (×2): 30 mg via ORAL
  Filled 2022-08-12 (×2): qty 2

## 2022-08-12 MED ORDER — CYANOCOBALAMIN 1000 MCG/ML IJ SOLN
1000.0000 ug | INTRAMUSCULAR | Status: DC
Start: 2022-08-13 — End: 2022-08-14

## 2022-08-12 MED ORDER — THIAMINE HCL 100 MG PO TABS
500.0000 mg | ORAL_TABLET | Freq: Every day | ORAL | Status: DC
  Administered 2022-08-12 – 2022-08-14 (×3): 500 mg via ORAL
  Filled 2022-08-12 (×5): qty 5

## 2022-08-12 NOTE — Progress Notes (Signed)
PROGRESS NOTE    Julie Acevedo  PIR:[id number] DOB: September 12, 1951 DOA: 08/08/2022 PCP: Jerrol Banana., MD   Chief Complaint  Patient presents with   Hypoglycemia    Brief Narrative:   Julie Acevedo is a 71 y.o. female with medical history significant of hypertension, hyperlipidemia, CAD, DM type II, arthritis, [name], thyroid nodules, and history of MRSA who presents due to low blood sugar.  Patient recalls going to bed and not feeling well.  She was awoken out of her sleep due to feeling her heart racing.  She got up because it felt like her heart was beating out of her chest, and reported that she got very sweaty.  He got up and sat down on the couch in the living room. Denied having any chest pain at the time.  She called 911 due to her symptoms and the next thing she recalls is being here at the hospital.  Patient reports that she has been having intermittent low blood sugars and had previously been hospitalized for the same.  She is not on insulin and only takes metformin if her blood sugars are elevated.  Furthermore, patient notes that over the last week she has been having persistent diarrhea.  Denies having any blood in stools.  Notes associated symptoms of lower abdominal soreness, nausea, some trace lower extremity swelling, urinary frequency, headaches, and neck discomfort.  Patient reports that she normally does not get headaches, but has been having them more frequently.  She reports having decreased range of motion when turning her neck to the right side and pain radiating down her neck.  Denies any recent trauma or falls to onset of the symptoms.   Her husband states that she has been having a poor appetite since she had gastric bypass.  He makes note that she drinks fluids, but does not eat appropriately.   Upon the fire department arrival patient was noted to be unresponsive with CBG 58.  Patient was given D10 with IV placed with improvement in glucose to 173.   In  the emergency department patient vital signs were noted to be stable.  Labs noted hemoglobin 10.2, potassium 3.3, BUN 14, creatinine 1.37, lipase 54, AST 43, and ALT 45.  Urinalysis noted moderate leukocytes with no bacteria seen and 0-5 WBCs.  Patient has been given amp of D50, but blood sugars dropped again as low as 60 while in the ED.  Patient was placed on D5 IV fluids at 125 mL/h    Assessment & Plan:   Principal Problem:   Hypoglycemia Active Problems:   Diarrhea   Lower abdominal pain   Anemia in chronic kidney disease   UTI (urinary tract infection)   Hypokalemia   Benign essential HTN   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, psychotic disturbance, mood disturbance, or anxiety (HCC)   Elevated lipase   Transaminitis   Gastric bypass status for obesity   Gastroesophageal reflux disease   Hypoalbuminemia   Malnutrition of moderate degree   Diabetes mellitus, type II, uncontrolled with hypoglycemia -Patient appears to be 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 -Attempt to decrease her D10W today. - nutritionist consulted about techniques to avoid hypoglycemia. -continue regular diet and Ensure and supplements -Continue to monitor CBG closely -Continue to  hold metformin -TSH within normal limit -Cosyntropin stim test WNL -Patient reports he has been eating candy at home to keep her CBG appropriate -Continue with D50, adjust as needed, this morning she had hypoglycemic episode, but currently her CBG at 220, will decrease rate to 20 cc/h -We will check proinsulin and C-peptide -Increase her Remeron to allow for better oral intake -No concerning finding in CT abdomen and pelvis to explain her hypoglycemia(questionable fullness in left  rectus muscle, but no findings on her physical exam, her hemoglobin is stable)   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 -Vitamin D is reassuring . -B12 was low earlier in the year, has improved, but for now we will continue with IM supplements . -Zinc, copper  -B1 level is within normal limit -Followingwith neurology as an outpatient with diagnosis of fairly Lewy's body dementia   Diarrhea -With concern of malabsorption syndrome  -Started on zinc, continue with supplements pending results   anemia of chronic kidney disease Hemoglobin 10.2 g/dL, but appears to be around patient's baseline.  Patient denies any reports of bleeding  Possible UTI Patient reports having increased urinary frequency recently.  Urinalysis noted moderate leukocytes, negative nitrites, no bacteria seen, and 0-5 WBCs. -Check urine culture   Hypokalemia -Repleted, Essential hypertension Blood pressures anywhere from 124/63 -164/74.  Home blood pressure regimen includes metoprolol 25 mg daily. -Continue metoprolol with holding parameters   [name] Patient appears to be alert and oriented x4 at this time. -Delirium precautions -Continue outpatient follow-up with neurology   Elevated lipase  transaminitis Acute.     Labs noted lipase 53, AST 43, ALT 45. -Continue to trend  Hyperlipidemia Home medication regimen includes atorvastatin 40 mg daily. -Continue statin    Hypoalbuminemia Acute.  Albumin 3.2. -Check prealbumin -Boost shakes in between meals   GERD -Continue pharmacy substitution of Protonix   DVT prophylaxis: Lovenox Code Status: Full Family Communication: None at bedside Disposition:   Status is: Inpatient   Consultants:  none   Subjective:  Sent with rapid response called this morning due to altered mental status from  hypoglycemia  Objective: Vitals:   08/11/22 2312 08/12/22 0332 08/12/22 0931 08/12/22 1250  BP: (!) 116/59 122/66 126/75 (!) 127/59  Pulse: 64 61 69 66  Resp: 14 (!) '23 17 18  '$ Temp: 97.9 F (36.6 C) 98.2 F (36.8 C) 98 F (36.7 C)   TempSrc: Oral Oral Oral   SpO2: 98% 99% 98% 98%  Weight:      Height:        Intake/Output Summary (Last 24 hours) at 08/12/2022 1533 Last data filed at 08/12/2022 0933 Gross per 24 hour  Intake 3 ml  Output --  Net 3 ml    Filed Weights   08/08/22 0024 08/08/22 2014  Weight: 56.1 kg 56.8 kg    Examination:   Awake Alert, Oriented X 3, frail, deconditioned, thin appearing, easily distracted  Symmetrical Chest wall movement, Good air movement bilaterally, CTAB RRR,No Gallops,Rubs or new Murmurs, No Parasternal Heave +ve B.Sounds, Abd Soft, No tenderness, No rebound - guarding or rigidity. No Cyanosis, Clubbing or edema, No new Rash or bruise         Data Reviewed: I have personally reviewed following labs and imaging studies  CBC: Recent Labs  Lab 08/08/22 0115 08/09/22 0404 08/10/22 0400 08/12/22 0637  WBC 7.9 6.8 7.9 6.9  NEUTROABS 5.5  --   --   --  HGB 10.2* 9.6* 10.8* 10.2*  HCT 32.5* 30.2* 33.8* 31.6*  MCV 86.7 84.8 85.6 83.8  PLT 224 193 221 277    Basic Metabolic Panel: Recent Labs  Lab 08/08/22 0115 08/08/22 1242 08/09/22 0404 08/10/22 0400 08/11/22 0617 08/12/22 0637  NA 141  --  138 140 142 141  K 3.3*  --  3.6 4.2 4.2 4.0  CL 107  --  106 105 107 106  CO2 29  --  '27 28 30 28  '$ GLUCOSE 113*  --  129* 81 102* 80  BUN 14  --  6* '13 17 17  '$ CREATININE 1.37*  --  1.21* 1.35* 1.49* 1.53*  CALCIUM 9.2  --  8.6* 8.7* 8.9 8.7*  MG  --  1.7  --  2.0  --  2.0  PHOS  --   --   --  4.7* 4.7* 4.4    GFR: Estimated Creatinine Clearance: 28.7 mL/min (A) (by C-G formula based on SCr of 1.53 mg/dL (H)).  Liver Function Tests: Recent Labs  Lab 08/08/22 0115 08/10/22 0400  AST 43* 44*  ALT 45* 46*  ALKPHOS  123 129*  BILITOT 0.2* 0.4  PROT 6.0* 6.0*  ALBUMIN 3.2* 3.0*    CBG: Recent Labs  Lab 08/11/22 2106 08/12/22 0344 08/12/22 0652 08/12/22 0705 08/12/22 1253  GLUCAP 99 104* 69* 158* 252*     No results found for this or any previous visit (from the past 240 hour(s)).       Radiology Studies: CT HEAD WO CONTRAST (5MM)  Result Date: 08/12/2022 CLINICAL DATA:  Acute mental status change EXAM: CT HEAD WITHOUT CONTRAST TECHNIQUE: Contiguous axial images were obtained from the base of the skull through the vertex without intravenous contrast. RADIATION DOSE REDUCTION: This exam was performed according to the departmental dose-optimization program which includes automated exposure control, adjustment of the mA and/or kV according to patient size and/or use of iterative reconstruction technique. COMPARISON:  September 06, 2021 FINDINGS: Brain: No subdural, epidural, or subarachnoid hemorrhage. Cerebellum, brainstem, and basal cisterns are normal. No mass effect or midline shift. Ventricles and sulci are unremarkable. No acute cortical ischemia or infarct. Vascular: No hyperdense vessel or unexpected calcification. Skull: Normal. Negative for fracture or focal lesion. Sinuses/Orbits: No acute finding. Other: None. IMPRESSION: No acute intracranial abnormalities are identified. Electronically Signed   By: Dorise Bullion III M.D.   On: 08/12/2022 08:50   CT ABDOMEN PELVIS WO CONTRAST  Result Date: 08/11/2022 CLINICAL DATA:  Hypoglycemia.  History of gastric bypass EXAM: CT ABDOMEN AND PELVIS WITHOUT CONTRAST TECHNIQUE: Multidetector CT imaging of the abdomen and pelvis was performed following the standard protocol without IV contrast. RADIATION DOSE REDUCTION: This exam was performed according to the departmental dose-optimization program which includes automated exposure control, adjustment of the mA and/or kV according to patient size and/or use of iterative reconstruction technique.  COMPARISON:  04/04/2021, 04/12/2020 FINDINGS: Lower chest: No acute abnormality. Hepatobiliary: No focal liver abnormality is seen. Status post cholecystectomy. No biliary dilatation. Pancreas: Unremarkable. No pancreatic ductal dilatation or surrounding inflammatory changes. Spleen: Normal in size without focal abnormality. Adrenals/Urinary Tract: Adrenal glands are unremarkable. Kidneys are normal, without renal calculi, focal lesion, or hydronephrosis. Bladder is unremarkable for the degree of distension. Stomach/Bowel: Postsurgical changes from prior gastric bypass. Enteric contrast present within the distal small bowel and colon. No dilated loops of bowel to suggest obstruction. Moderate volume stool throughout the colon. Diverticular changes of the colon. No focal bowel wall thickening or  inflammatory changes. Vascular/Lymphatic: No significant vascular findings are present. No enlarged abdominal or pelvic lymph nodes. Reproductive: Status post hysterectomy. No adnexal masses. Other: No free fluid. No abdominopelvic fluid collection. No pneumoperitoneum. No abdominal wall hernia. Musculoskeletal: Asymmetric fullness of the left rectus musculature appears slightly more prominent compared to prior. A left-sided rectus sheath hematoma is not excluded. Prior L4-5 spinal fusion. 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 No acute bony findings. IMPRESSION: 1. Asymmetric fullness of the left rectus musculature appears slightly more prominent compared to prior CT. A left-sided rectus sheath hematoma is not excluded. 2. Moderate volume stool throughout the colon. No dilated loops of bowel to suggest obstruction. 3. Colonic diverticulosis without evidence of acute diverticulitis. 4. 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 Electronically Signed   By: Davina Poke D.O.   On: 08/11/2022 12:48        Scheduled  Meds:  atorvastatin  40 mg Oral Daily   bisacodyl  10 mg Oral Daily   calcitRIOL  0.25 mcg Oral Daily   calcium carbonate  200 mg of elemental calcium Oral TID   cyanocobalamin  1,000 mcg Intramuscular Daily   feeding supplement  1 Container Oral TID BM   feeding supplement  237 mL Oral BID BM   gabapentin  300 mg Oral TID   hydrOXYzine  100 mg Oral TID   metoprolol succinate  25 mg Oral Daily   mirtazapine  30 mg Oral QHS   multivitamin with minerals  1 tablet Oral BID   pantoprazole  40 mg Oral Daily   polyethylene glycol  17 g Oral BID   sodium chloride flush  3 mL Intravenous Q12H   thiamine  500 mg Oral Daily   zinc sulfate  220 mg Oral BID   Continuous Infusions:  dextrose 25 mL/hr at 08/11/22 1333     LOS: 3 days       Phillips Climes, MD [facility]   To contact the attending provider between 7A-7P or the covering provider during after hours 7P-7A, please log into the web site [email or web address] and access using universal [place] password for that web site. If you do not have the password, please call the hospital operator.  08/12/2022, 3:33 PM

## 2022-08-12 NOTE — Procedures (Signed)
Patient Name: Julie Acevedo  MRN: [id number]  Epilepsy Attending: Lora Havens  Referring Physician/Provider: Albertine Patricia, MD Date: 07/12/2022 Duration: 24 mins  Patient history: 71yo F with ams. EEG to evaluate for seizure  Level of alertness: Awake  AEDs during EEG study: GBP  Technical aspects: This EEG study was done with scalp electrodes positioned according to the 10-20 International system of electrode placement. Electrical activity was reviewed with band pass filter of 1-'70Hz'$ , sensitivity of 7 uV/mm, display speed of 32m/sec with a '60Hz'$  notched filter applied as appropriate. EEG data were recorded continuously and digitally stored.  Video monitoring was available and reviewed as appropriate.  Description: The posterior dominant rhythm consists of 9 Hz activity of moderate voltage (25-35 uV) seen predominantly in posterior head regions, symmetric and reactive to eye opening and eye closing. Hyperventilation and photic stimulation were not performed.     IMPRESSION: This study is within normal limits. No seizures or epileptiform discharges were seen throughout the recording.  Julie Acevedo Julie Acevedo

## 2022-08-12 NOTE — Progress Notes (Signed)
Rapid Response called. Patient received Cortrosyn dose. Within 1 min time after, patient went unresponsive, however vitals remained stable. HR was high 60s, BP 130/89, O2 100 RA, and RR 14. Patient was AxO x 4 prior. Patient was not responding to questions, stimulus rub, or pain. Blood glucose was checked and and she was 69. Prior glucose at 0344 was 103. Half amp of D50 was given. MD paged. Dr. Waldron Labs at bedside. Okay'd to give second half of D50 and increase D10 to 50cc per hour for continuous infusion. Upon rechecking blood glucose patient was 158. Patient opening her eyes currently, but still unable to provide verbal feedback. CT head ordered. Will update day RN and team. Continue to monitor.

## 2022-08-12 NOTE — Progress Notes (Signed)
RN spoke with lab in person at 0330 regarding [name] lab collect scheduled for 0500. Phlebotomy tech confirmed they would collect lab at 0500 as patient cosyntropin was due for 0530. Called lab several times, no answer. No collection was made. Night MD updated and will notify day shift RN to reschedule dose.

## 2022-08-12 NOTE — Progress Notes (Signed)
EEG complete - results pending 

## 2022-08-13 DIAGNOSIS — E162 Hypoglycemia, unspecified: Secondary | ICD-10-CM | POA: Diagnosis not present

## 2022-08-13 DIAGNOSIS — F02A0 Dementia in other diseases classified elsewhere, mild, without behavioral disturbance, psychotic disturbance, mood disturbance, and anxiety: Secondary | ICD-10-CM | POA: Diagnosis not present

## 2022-08-13 DIAGNOSIS — G3183 Dementia with Lewy bodies: Secondary | ICD-10-CM | POA: Diagnosis not present

## 2022-08-13 LAB — CBC
HCT: 29.4 % — ABNORMAL LOW (ref 36.0–46.0)
Hemoglobin: 9.4 g/dL — ABNORMAL LOW (ref 12.0–15.0)
MCH: 27 pg (ref 26.0–34.0)
MCHC: 32 g/dL (ref 30.0–36.0)
MCV: 84.5 fL (ref 80.0–100.0)
Platelets: 223 10*3/uL (ref 150–400)
RBC: 3.48 MIL/uL — ABNORMAL LOW (ref 3.87–5.11)
RDW: 13.6 % (ref 11.5–15.5)
WBC: 8.7 10*3/uL (ref 4.0–10.5)
nRBC: 0 % (ref 0.0–0.2)

## 2022-08-13 LAB — BASIC METABOLIC PANEL
Anion gap: 7 (ref 5–15)
BUN: 16 mg/dL (ref 8–23)
CO2: 24 mmol/L (ref 22–32)
Calcium: 8.7 mg/dL — ABNORMAL LOW (ref 8.9–10.3)
Chloride: 111 mmol/L (ref 98–111)
Creatinine, Ser: 1.42 mg/dL — ABNORMAL HIGH (ref 0.44–1.00)
GFR, Estimated: 40 mL/min — ABNORMAL LOW (ref >60)
Glucose, Bld: 97 mg/dL (ref 70–99)
Potassium: 3.3 mmol/L — ABNORMAL LOW (ref 3.5–5.1)
Sodium: 142 mmol/L (ref 135–145)

## 2022-08-13 LAB — GLUCOSE, CAPILLARY
Glucose-Capillary: 101 mg/dL — ABNORMAL HIGH (ref 70–99)
Glucose-Capillary: 103 mg/dL — ABNORMAL HIGH (ref 70–99)
Glucose-Capillary: 123 mg/dL — ABNORMAL HIGH (ref 70–99)
Glucose-Capillary: 128 mg/dL — ABNORMAL HIGH (ref 70–99)
Glucose-Capillary: 183 mg/dL — ABNORMAL HIGH (ref 70–99)
Glucose-Capillary: 250 mg/dL — ABNORMAL HIGH (ref 70–99)
Glucose-Capillary: 75 mg/dL (ref 70–99)
Glucose-Capillary: 81 mg/dL (ref 70–99)
Glucose-Capillary: 84 mg/dL (ref 70–99)
Glucose-Capillary: 87 mg/dL (ref 70–99)

## 2022-08-13 LAB — PHOSPHORUS: Phosphorus: 4.2 mg/dL (ref 2.5–4.6)

## 2022-08-13 MED ORDER — POTASSIUM CHLORIDE CRYS ER 20 MEQ PO TBCR
40.0000 meq | EXTENDED_RELEASE_TABLET | Freq: Four times a day (QID) | ORAL | Status: DC
  Filled 2022-08-13: qty 2

## 2022-08-13 MED ORDER — POTASSIUM CHLORIDE 20 MEQ PO PACK
40.0000 meq | PACK | Freq: Four times a day (QID) | ORAL | Status: AC
  Administered 2022-08-13 (×2): 40 meq via ORAL
  Filled 2022-08-13 (×2): qty 2

## 2022-08-13 NOTE — Care Management Important Message (Signed)
Important Message  Patient Details  Name: Julie Acevedo MRN: [id number] Date of Birth: September 18, 1951   Medicare Important Message Given:  Yes     Orbie Pyo 08/13/2022, 2:29 PM

## 2022-08-13 NOTE — Progress Notes (Signed)
PROGRESS NOTE    BARRY [facility]  OZD:[id number] DOB: Sep 04, 1951 DOA: 08/08/2022 PCP: Jerrol Banana., MD   Chief Complaint  Patient presents with   Hypoglycemia    Brief Narrative:   JASKIRAT ZERTUCHE is a 71 y.o. female with medical history significant of hypertension, hyperlipidemia, CAD, DM type II, arthritis, [name], thyroid nodules, and history of MRSA who presents due to low blood sugar.  Patient recalls going to bed and not feeling well.  She was awoken out of her sleep due to feeling her heart racing.  She got up because it felt like her heart was beating out of her chest, and reported that she got very sweaty.  He got up and sat down on the couch in the living room. Denied having any chest pain at the time.  She called 911 due to her symptoms and the next thing she recalls is being here at the hospital.  Patient reports that she has been having intermittent low blood sugars and had previously been hospitalized for the same.  She is not on insulin and only takes metformin if her blood sugars are elevated.  Furthermore, patient notes that over the last week she has been having persistent diarrhea.  Denies having any blood in stools.  Notes associated symptoms of lower abdominal soreness, nausea, some trace lower extremity swelling, urinary frequency, headaches, and neck discomfort.  Patient reports that she normally does not get headaches, but has been having them more frequently.  She reports having decreased range of motion when turning her neck to the right side and pain radiating down her neck.  Denies any recent trauma or falls to onset of the symptoms.   Her husband states that she has been having a poor appetite since she had gastric bypass.  He makes note that she drinks fluids, but does not eat appropriately.   Upon the fire department arrival patient was noted to be unresponsive with CBG 58.  Patient was given D10 with IV placed with improvement in glucose to 173.   In  the emergency department patient vital signs were noted to be stable.  Labs noted hemoglobin 10.2, potassium 3.3, BUN 14, creatinine 1.37, lipase 54, AST 43, and ALT 45.  Urinalysis noted moderate leukocytes with no bacteria seen and 0-5 WBCs.  Patient has been given amp of D50, but blood sugars dropped again as low as 60 while in the ED.  Patient was placed on D5 IV fluids at 125 mL/h    Assessment & Plan:   Principal Problem:   Hypoglycemia Active Problems:   Diarrhea   Lower abdominal pain   Anemia in chronic kidney disease   UTI (urinary tract infection)   Hypokalemia   Benign essential HTN   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, psychotic disturbance, mood disturbance, or anxiety (HCC)   Elevated lipase   Transaminitis   Gastric bypass status for obesity   Gastroesophageal reflux disease   Hypoalbuminemia   Malnutrition of moderate degree   Diabetes mellitus, type II, uncontrolled with hypoglycemia -Patient appears to be 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 - nutritionist consulted about techniques to avoid hypoglycemia.  He was encouraged to have snack  at 3 to 4 AM, as her most hypoglycemic episode happening early morning. -continue regular diet and Ensure and supplements -Continue to monitor CBG closely, will monitor every 2 hours -Continue to hold metformin -TSH within normal limit -Cosyntropin stim test WNL -Increase her Remeron to allow for better oral intake are pending. -No concerning finding in CT abdomen and pelvis to explain her hypoglycemia(questionable fullness in left rectus muscle, but no findings on her physical exam, her hemoglobin is stable)  Cognitive  impairment -Sent slow to respond, even though mostly appropriate, she is followed closely by [place] neurology at [facility], with diagnosis of mild to moderate Louis body dementia. -Think her dementia is contributing to her poor oral intake and appetite. 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 -Vitamin D is reassuring . -B12 was low earlier in the year, has improved, but for now we will continue with IM supplements . -Zinc, copper  -B1 level is within normal limit -Followingwith neurology as an outpatient with diagnosis of fairly Lewy's body dementia   Diarrhea -With concern of malabsorption syndrome  -Started on zinc, continue with supplements pending results   anemia of chronic kidney disease Hemoglobin 10.2 g/dL, but appears to be around patient's baseline.  Patient denies any reports of bleeding   Hypokalemia -Repleted,  Essential hypertension Blood pressures anywhere from 124/63 -164/74.  Home blood pressure regimen includes metoprolol 25 mg daily. -Continue metoprolol with holding parameters   [name] Patient appears to be alert and oriented x4 at this time. -Delirium precautions -Continue outpatient follow-up with neurology   Elevated lipase  transaminitis Acute.     Labs noted lipase 53, AST 43, ALT 45. -Continue to trend  Hyperlipidemia Home medication regimen includes atorvastatin 40 mg daily. -Continue statin    Hypoalbuminemia Acute.  Albumin 3.2. -Check prealbumin -Boost shakes in between meals   GERD -Continue pharmacy substitution of Protonix   DVT prophylaxis: Lovenox Code Status: Full Family Communication: D/W daughter at bedside Disposition:   Status is: Inpatient    Consultants:  none   Subjective:  No significant events overnight, no further hypoglycemic episodes over last 24 hours.  Objective: Vitals:   08/13/22 0333 08/13/22 0819 08/13/22 1207 08/13/22 1610  BP: 118/62 120/60 128/60 128/67  Pulse: (!) 59 74 68 70  Resp: '13 18 18 15  '$ Temp: 97.9 F (36.6 C) 98 F (36.7 C) 98 F (36.7 C) 98.4 F (36.9 C)  TempSrc: Oral Oral Oral Oral  SpO2: 99% 99%    Weight:      Height:       No intake or output data in the 24 hours ending 08/13/22 1619   Filed Weights   08/08/22 0024 08/08/22 2014  Weight: 56.1 kg 56.8 kg    Examination:   Awake Alert, Oriented X 3, frail, deconditioned, thin appearing, easily distracted  Symmetrical Chest wall movement, Good air movement bilaterally, CTAB RRR,No Gallops,Rubs or new Murmurs, No Parasternal Heave +ve B.Sounds, Abd Soft, No tenderness, No rebound - guarding or rigidity. No Cyanosis, Clubbing or edema, No new Rash or bruise         Data Reviewed: I have personally reviewed following labs and imaging studies  CBC: Recent Labs  Lab 08/08/22 0115 08/09/22 0404 08/10/22 0400 08/12/22 0109 08/13/22  0332  WBC 7.9 6.8 7.9 6.9 8.7  NEUTROABS 5.5  --   --   --   --   HGB 10.2* 9.6* 10.8* 10.2* 9.4*  HCT 32.5* 30.2* 33.8* 31.6* 29.4*  MCV 86.7 84.8 85.6 83.8 84.5  PLT 224 193 221 236 223     Basic Metabolic Panel: Recent Labs  Lab 08/08/22 1242 08/09/22 0404 08/10/22 0400 08/11/22 0617 2022/09/07 0637 08/13/22 0332  NA  --  138 140 142 141 142  K  --  3.6 4.2 4.2 4.0 3.3*  CL  --  106 105 107 106 111  CO2  --  '27 28 30 28 24  '$ GLUCOSE  --  129* 81 102* 80 97  BUN  --  6* '13 17 17 16  '$ CREATININE  --  1.21* 1.35* 1.49* 1.53* 1.42*  CALCIUM  --  8.6* 8.7* 8.9 8.7* 8.7*  MG 1.7  --  2.0  --  2.0  --   PHOS  --   --  4.7* 4.7* 4.4 4.2     GFR: Estimated Creatinine Clearance: 30.9 mL/min (A) (by C-G formula based on SCr of 1.42 mg/dL (H)).  Liver Function Tests: Recent  Labs  Lab 08/08/22 0115 08/10/22 0400  AST 43* 44*  ALT 45* 46*  ALKPHOS 123 129*  BILITOT 0.2* 0.4  PROT 6.0* 6.0*  ALBUMIN 3.2* 3.0*     CBG: Recent Labs  Lab 08/13/22 0507 08/13/22 0615 08/13/22 0824 08/13/22 1210 08/13/22 1611  GLUCAP 81 183* 87 75 101*      No results found for this or any previous visit (from the past 240 hour(s)).       Radiology Studies: EEG adult  Result Date: 09-07-22 Lora Havens, MD     Sep 07, 2022  9:11 PM Patient Name: TANDI HANKO MRN: [id number] Epilepsy Attending: Lora Havens Referring Physician/Provider: Albertine Patricia, MD Date: 07/12/2022 Duration: 24 mins Patient history: 71yo F with ams. EEG to evaluate for seizure Level of alertness: Awake AEDs during EEG study: GBP Technical aspects: This EEG study was done with scalp electrodes positioned according to the 10-20 International system of electrode placement. Electrical activity was reviewed with band pass filter of 1-'70Hz'$ , sensitivity of 7 uV/mm, display speed of 62m/sec with a '60Hz'$  notched filter applied as appropriate. EEG data were recorded continuously and digitally stored.  Video monitoring was available and reviewed as appropriate. Description: The posterior dominant rhythm consists of 9 Hz activity of moderate voltage (25-35 uV) seen predominantly in posterior head regions, symmetric and reactive to eye opening and eye closing. Hyperventilation and photic stimulation were not performed.   IMPRESSION: This study is within normal limits. No seizures or epileptiform discharges were seen throughout the recording. Priyanka OBarbra Sarks  CT HEAD WO CONTRAST (5MM)  Result Date: 82023-09-22CLINICAL DATA:  Acute mental status change EXAM: CT HEAD WITHOUT CONTRAST TECHNIQUE: Contiguous axial images were obtained from the base of the skull through the vertex without intravenous contrast. RADIATION DOSE REDUCTION: This exam was performed according to the departmental dose-optimization  program which includes automated exposure control, adjustment of the mA and/or kV according to patient size and/or use of iterative reconstruction technique. COMPARISON:  September 06, 2021 FINDINGS: Brain: No subdural, epidural, or subarachnoid hemorrhage. Cerebellum, brainstem, and basal cisterns are normal. No mass effect or midline shift. Ventricles and sulci are unremarkable. No acute cortical ischemia or infarct. Vascular: No hyperdense vessel or unexpected calcification. Skull: Normal. Negative for fracture or  focal lesion. Sinuses/Orbits: No acute finding. Other: None. IMPRESSION: No acute intracranial abnormalities are identified. Electronically Signed   By: Dorise Bullion III M.D.   On: 08/12/2022 08:50        Scheduled Meds:  atorvastatin  40 mg Oral Daily   bisacodyl  10 mg Oral Daily   calcitRIOL  0.25 mcg Oral Daily   calcium carbonate  200 mg of elemental calcium Oral TID   cyanocobalamin  1,000 mcg Intramuscular Weekly   feeding supplement  1 Container Oral TID BM   feeding supplement  237 mL Oral BID BM   gabapentin  300 mg Oral TID   hydrOXYzine  100 mg Oral TID   metoprolol succinate  25 mg Oral Daily   mirtazapine  30 mg Oral QHS   multivitamin with minerals  1 tablet Oral BID   pantoprazole  40 mg Oral Daily   polyethylene glycol  17 g Oral BID   potassium chloride  40 mEq Oral Q6H   sodium chloride flush  3 mL Intravenous Q12H   thiamine  500 mg Oral Daily   zinc sulfate  220 mg Oral BID   Continuous Infusions:     LOS: 4 days       Phillips Climes, MD [facility]   To contact the attending provider between 7A-7P or the covering provider during after hours 7P-7A, please log into the web site [email or web address] and access using universal [place] password for that web site. If you do not have the password, please call the hospital operator.  08/13/2022, 4:19 PM

## 2022-08-13 NOTE — Plan of Care (Signed)

## 2022-08-14 ENCOUNTER — Other Ambulatory Visit (HOSPITAL_COMMUNITY): Payer: Self-pay

## 2022-08-14 ENCOUNTER — Encounter: Payer: Self-pay | Admitting: Oncology

## 2022-08-14 DIAGNOSIS — I10 Essential (primary) hypertension: Secondary | ICD-10-CM | POA: Diagnosis not present

## 2022-08-14 DIAGNOSIS — E440 Moderate protein-calorie malnutrition: Secondary | ICD-10-CM | POA: Diagnosis not present

## 2022-08-14 DIAGNOSIS — E162 Hypoglycemia, unspecified: Secondary | ICD-10-CM | POA: Diagnosis not present

## 2022-08-14 DIAGNOSIS — Z9884 Bariatric surgery status: Secondary | ICD-10-CM | POA: Diagnosis not present

## 2022-08-14 LAB — HEPATIC FUNCTION PANEL
ALT: 105 U/L — ABNORMAL HIGH (ref 0–44)
AST: 111 U/L — ABNORMAL HIGH (ref 15–41)
Albumin: 2.9 g/dL — ABNORMAL LOW (ref 3.5–5.0)
Alkaline Phosphatase: 134 U/L — ABNORMAL HIGH (ref 38–126)
Bilirubin, Direct: <0.1 mg/dL (ref 0.0–0.2)
Total Bilirubin: 0.3 mg/dL (ref 0.3–1.2)
Total Protein: 5.7 g/dL — ABNORMAL LOW (ref 6.5–8.1)

## 2022-08-14 LAB — GLUCOSE, CAPILLARY
Glucose-Capillary: 102 mg/dL — ABNORMAL HIGH (ref 70–99)
Glucose-Capillary: 130 mg/dL — ABNORMAL HIGH (ref 70–99)
Glucose-Capillary: 159 mg/dL — ABNORMAL HIGH (ref 70–99)
Glucose-Capillary: 220 mg/dL — ABNORMAL HIGH (ref 70–99)
Glucose-Capillary: 84 mg/dL (ref 70–99)
Glucose-Capillary: 85 mg/dL (ref 70–99)
Glucose-Capillary: 97 mg/dL (ref 70–99)

## 2022-08-14 LAB — BASIC METABOLIC PANEL
Anion gap: 7 (ref 5–15)
BUN: 14 mg/dL (ref 8–23)
CO2: 21 mmol/L — ABNORMAL LOW (ref 22–32)
Calcium: 8.9 mg/dL (ref 8.9–10.3)
Chloride: 115 mmol/L — ABNORMAL HIGH (ref 98–111)
Creatinine, Ser: 1.35 mg/dL — ABNORMAL HIGH (ref 0.44–1.00)
GFR, Estimated: 43 mL/min — ABNORMAL LOW (ref >60)
Glucose, Bld: 158 mg/dL — ABNORMAL HIGH (ref 70–99)
Potassium: 4.3 mmol/L (ref 3.5–5.1)
Sodium: 143 mmol/L (ref 135–145)

## 2022-08-14 LAB — ZINC: Zinc: 66 ug/dL (ref 44–115)

## 2022-08-14 LAB — COPPER, SERUM: Copper: 83 ug/dL (ref 80–158)

## 2022-08-14 LAB — MAGNESIUM: Magnesium: 2 mg/dL (ref 1.7–2.4)

## 2022-08-14 LAB — C-PEPTIDE: C-Peptide: 14.4 ng/mL — ABNORMAL HIGH (ref 1.1–4.4)

## 2022-08-14 MED ORDER — MIRTAZAPINE 30 MG PO TABS: 30.0000 mg | ORAL_TABLET | Freq: Every day | ORAL | 0 refills | Status: DC

## 2022-08-14 MED ORDER — ACARBOSE 25 MG PO TABS
25.0000 mg | ORAL_TABLET | Freq: Three times a day (TID) | ORAL | Status: DC
  Filled 2022-08-14: qty 1

## 2022-08-14 MED ORDER — CYANOCOBALAMIN 500 MCG PO TABS: 500.0000 ug | ORAL_TABLET | Freq: Every day | ORAL | Status: DC

## 2022-08-14 MED ORDER — DIAZOXIDE 50 MG/ML PO SUSP: 3.0000 mg/kg/d | Freq: Three times a day (TID) | ORAL | 12 refills | Status: DC

## 2022-08-14 MED ORDER — COPPER 2 MG PO TABS: 2.0000 mg | Freq: Every day | Status: DC

## 2022-08-14 MED ORDER — DIAZOXIDE 50 MG/ML PO SUSP
3.0000 mg/kg/d | Freq: Three times a day (TID) | ORAL | Status: DC
  Filled 2022-08-14 (×3): qty 1

## 2022-08-14 MED ORDER — DIAZOXIDE 50 MG/ML PO SUSP
3.0000 mg/kg/d | Freq: Three times a day (TID) | ORAL | 12 refills | Status: DC
  Filled 2022-08-14: qty 30, 10d supply, fill #0

## 2022-08-14 MED ORDER — ZINC GLUCONATE 50 MG PO TABS: 50.0000 mg | ORAL_TABLET | Freq: Every day | ORAL | Status: DC

## 2022-08-14 MED ORDER — MIRTAZAPINE 30 MG PO TABS
30.0000 mg | ORAL_TABLET | Freq: Every day | ORAL | 0 refills | Status: DC
  Filled 2022-08-14: qty 30, 30d supply, fill #0

## 2022-08-14 NOTE — Plan of Care (Signed)

## 2022-08-14 NOTE — Discharge Summary (Signed)
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 - nutritionist consulted about techniques to avoid hypoglycemia.  He was encouraged to have very early snack around 4 to 5 AM given she is usually having hypoglycemic episode early morning.  . -Was encouraged to have regular diet, high-protein content, with increased snacks . -Continue to hold metformin -TSH within normal limit -Cosyntropin stim test WNL -CT head/EEG with no acute findings -Increase her Remeron to allow for better oral intake, continue to uptitrate to maximum dose -No concerning finding in CT abdomen and pelvis to explain her hypoglycemia(questionable fullness in left rectus muscle, but no findings on her physical exam, her hemoglobin is stable) -Patient procalcitonin came back elevated at 14.5, with clinical suspicion for postprandial hyperinsulinemic hypoglycemia after gastric  bypass surgery, she was started on diazoxide to see if it helps with that, she was instructed to follow-up with her bariatric clinic.   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 -Think her dementia is contributing to her poor oral intake and appetite. 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 -Vitamin D is reassuring . -B12 was low earlier in the year, has improved, but for now we will continue with IM supplements . -Zinc, copper level within normal range but lower rang of normal, so she will be encouraged to supplements as an outpatient. -B1 level is within normal limit -Followingwith neurology as an outpatient with diagnosis of fairly Lewy's body dementia    Diarrhea -With concern of malabsorption syndrome  -Started on zinc,   anemia of chronic kidney disease Hemoglobin 10.2 g/dL, but appears to be around patient's baseline.  Patient denies any reports of bleeding   Hypokalemia -Repleted,   Essential hypertension Resume home meds   [name] Patient appears to be alert and oriented x4 at this time. -Delirium precautions -Continue outpatient follow-up with neurology   Elevated lipase  transaminitis Acute.     Labs noted lipase 53, AST 43, ALT 45. -Continue to trend   Hyperlipidemia Home medication regimen includes atorvastatin 40 mg daily. -Continue statin     Hypoalbuminemia Acute.  Albumin 3.2. -Check prealbumin -Boost shakes in between meals   GERD -Continue pharmacy substitution of Protonix  Discharge Diagnoses:  Principal Problem:    Hypoglycemia Active Problems:   Diarrhea   Lower abdominal pain   Anemia in chronic kidney disease   UTI (urinary tract infection)   Hypokalemia   Benign essential HTN   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, psychotic disturbance, mood disturbance, or anxiety (HCC)   Elevated lipase   Transaminitis   Gastric bypass status for obesity   Gastroesophageal reflux disease   Hypoalbuminemia   Malnutrition of moderate degree    Discharge Instructions  Discharge Instructions     Diet - low sodium heart healthy   Complete by: As directed    Diet - low sodium heart healthy   Complete by: As directed    Discharge instructions   Complete by: As directed    Follow with Primary MD Jerrol Banana., MD in 7 days   Get CBC, CMP, thiamine, B12 and A1c levels     Activity: As tolerated with Full fall precautions use walker/cane & assistance as needed   Disposition Home    Diet: Regular Diet.   On your next visit with your primary care physician please Get Medicines reviewed and adjusted.   Please request your Prim.MD to go over all Hospital Tests and Procedure/Radiological results at the follow up, please get all Hospital records sent to your Prim MD by signing hospital release before you go home.   If you experience worsening of your admission symptoms, develop shortness of breath, life threatening emergency, suicidal or homicidal thoughts you must seek medical attention immediately by calling 911 or calling your MD immediately  if symptoms less severe.  You Must read complete instructions/literature along with all the possible adverse reactions/side effects for all the Medicines you take and that have been prescribed to you. Take any new Medicines after you have completely understood and accpet all the possible adverse reactions/side effects.   Do not drive, operating heavy machinery, perform activities at heights, swimming or participation in water  activities or provide baby sitting services if your were admitted for syncope or siezures until you have seen by Primary MD or a Neurologist and advised to do so again.  Do not drive when taking Pain medications.    Do not take more than prescribed Pain, Sleep and Anxiety Medications  Special Instructions: If you have smoked or chewed Tobacco  in the last 2 yrs please stop smoking, stop any regular Alcohol  and or any Recreational drug use.  Wear Seat belts while driving.   Please note  You were cared for by a hospitalist during your hospital stay. If you have any questions about your discharge medications or the care you received while you were in the hospital after you are discharged, you can call the unit and asked to speak with the hospitalist on call if the hospitalist that took care of you is not available. Once you are discharged, your primary care physician will handle any further medical issues. Please note that NO REFILLS for any discharge medications will be authorized once you are discharged, as it is imperative that you return to your primary care physician (or establish a relationship with a primary care physician if you do not have one) for your aftercare needs so that they can reassess your need for medications and monitor your lab values.   Discharge instructions   Complete by: As directed    Follow with Primary MD Jerrol Banana., MD in 7 days   Get CBC, CMP,  checked  by Primary MD next visit.    Activity: As tolerated with Full fall precautions use walker/cane & assistance as needed   Disposition Home    Diet: Regular diet, more frequent meals, as well please take late evening snack, and early morning snack as well.   On your next visit with your primary care physician please Get Medicines reviewed and adjusted.   Please request your Prim.MD to go over all Hospital Tests and Procedure/Radiological results at the follow up, please get all Hospital records  sent to your Prim MD by signing hospital release before you go home.   If you experience worsening of your admission symptoms, develop shortness of breath, life threatening emergency, suicidal or homicidal thoughts you must seek medical attention immediately by calling 911 or calling your MD immediately  if symptoms less severe.  You Must read complete instructions/literature along with all the possible adverse reactions/side effects for all the Medicines you take and that have been prescribed to you. Take any new Medicines after you have completely understood and accpet all the possible adverse reactions/side effects.   Do not drive, operating heavy machinery, perform activities at heights, swimming or participation in water activities or provide baby sitting services if your were admitted for syncope or siezures until you have seen by Primary MD or a Neurologist and advised to do so again.  Do not drive when taking Pain medications.    Do not take more than prescribed Pain, Sleep and Anxiety Medications  Special Instructions: If you have smoked or chewed Tobacco  in the last 2 yrs please stop smoking, stop any regular Alcohol  and or any Recreational drug use.  Wear Seat belts while driving.   Please note  You were cared for by a hospitalist during your hospital stay. If you have any questions about your discharge medications or the care you received while you were in the hospital after you are discharged, you can call the unit and asked to speak with the hospitalist on call if the hospitalist that took care of you is not available. Once you are discharged, your primary care physician will handle any further medical issues. Please note that NO REFILLS for any discharge medications will be authorized once you are discharged, as it is imperative that you return to your primary care physician (or establish a relationship with a primary care physician if you do not have one) for your aftercare  needs so that they can reassess your need for medications and monitor your lab values.   Increase activity slowly   Complete by: As directed    Increase activity slowly   Complete by: As directed       Allergies as of 08/14/2022       Reactions   Lotrel [amlodipine Besy-benazepril Hcl] Anaphylaxis   Contrast Media [iodinated Contrast Media] Swelling, Other (See Comments)   Causes neck swelling    Niacin Hives   Sulfa Antibiotics Hives        Medication List     STOP taking these medications    metFORMIN 500 MG tablet Commonly known as: GLUCOPHAGE       TAKE these medications    atorvastatin 40 MG tablet Commonly known as: LIPITOR Take 1 tablet (40 mg total) by mouth daily.   calcitRIOL 0.25 MCG capsule Commonly known as: ROCALTROL Take 0.25 mcg by mouth daily.   copper tablet Take 1 tablet (2 mg total) by mouth daily.   cyanocobalamin 500 MCG tablet Commonly  known as: VITAMIN B12 Take 1 tablet (500 mcg total) by mouth daily.   diazoxide 50 MG/ML suspension Commonly known as: PROGLYCEM Take 1 mL (50 mg total) by mouth every 8 (eight) hours.   gabapentin 300 MG capsule Commonly known as: NEURONTIN Take 1 capsule (300 mg total) by mouth 3 (three) times daily.   hydrOXYzine 100 MG capsule Commonly known as: VISTARIL Take 1 capsule (100 mg total) by mouth 3 (three) times daily.   linaclotide 290 MCG Caps capsule Commonly known as: Linzess TAKE 1 CAPSULE BY MOUTH EVERY DAY BEFORE BREAKFAST What changed:  how much to take how to take this when to take this additional instructions   metoprolol succinate 25 MG 24 hr tablet Commonly known as: TOPROL-XL Take 1 tablet (25 mg total) by mouth daily.   mirtazapine 30 MG tablet Commonly known as: REMERON Take 1 tablet (30 mg total) by mouth at bedtime. What changed:  medication strength how much to take   omeprazole 20 MG capsule Commonly known as: PRILOSEC Take 1 capsule (20 mg total) by mouth  daily.   ondansetron 4 MG tablet Commonly known as: ZOFRAN TAKE 1 TABLET BY MOUTH EVERY 8 HOURS AS NEEDED What changed: reasons to take this   traMADol 50 MG tablet Commonly known as: ULTRAM Take 1 tablet (50 mg total) by mouth every 6 (six) hours as needed. What changed: reasons to take this   zinc gluconate 50 MG tablet Take 1 tablet (50 mg total) by mouth daily.        Allergies  Allergen Reactions   Lotrel [Amlodipine Besy-Benazepril Hcl] Anaphylaxis   Contrast Media [Iodinated Contrast Media] Swelling and Other (See Comments)    Causes neck swelling    Niacin Hives   Sulfa Antibiotics Hives    Consultations: None   Procedures/Studies: EEG adult  Result Date: 2022-09-02 Lora Havens, MD     September 02, 2022  9:11 PM Patient Name: JULIZZA SASSONE MRN: [id number] Epilepsy Attending: Lora Havens Referring Physician/Provider: Albertine Patricia, MD Date: 07/12/2022 Duration: 24 mins Patient history: 71yo F with ams. EEG to evaluate for seizure Level of alertness: Awake AEDs during EEG study: GBP Technical aspects: This EEG study was done with scalp electrodes positioned according to the 10-20 International system of electrode placement. Electrical activity was reviewed with band pass filter of 1-'70Hz'$ , sensitivity of 7 uV/mm, display speed of 71m/sec with a '60Hz'$  notched filter applied as appropriate. EEG data were recorded continuously and digitally stored.  Video monitoring was available and reviewed as appropriate. Description: The posterior dominant rhythm consists of 9 Hz activity of moderate voltage (25-35 uV) seen predominantly in posterior head regions, symmetric and reactive to eye opening and eye closing. Hyperventilation and photic stimulation were not performed.   IMPRESSION: This study is within normal limits. No seizures or epileptiform discharges were seen throughout the recording. Priyanka OBarbra Sarks  CT HEAD WO CONTRAST (5MM)  Result Date: 82023-09-17CLINICAL  DATA:  Acute mental status change EXAM: CT HEAD WITHOUT CONTRAST TECHNIQUE: Contiguous axial images were obtained from the base of the skull through the vertex without intravenous contrast. RADIATION DOSE REDUCTION: This exam was performed according to the departmental dose-optimization program which includes automated exposure control, adjustment of the mA and/or kV according to patient size and/or use of iterative reconstruction technique. COMPARISON:  September 06, 2021 FINDINGS: Brain: No subdural, epidural, or subarachnoid hemorrhage. Cerebellum, brainstem, and basal cisterns are normal. No mass effect or midline shift. Ventricles and  sulci are unremarkable. No acute cortical ischemia or infarct. Vascular: No hyperdense vessel or unexpected calcification. Skull: Normal. Negative for fracture or focal lesion. Sinuses/Orbits: No acute finding. Other: None. IMPRESSION: No acute intracranial abnormalities are identified. Electronically Signed   By: Dorise Bullion III M.D.   On: 08/12/2022 08:50   CT ABDOMEN PELVIS WO CONTRAST  Result Date: 08/11/2022 CLINICAL DATA:  Hypoglycemia.  History of gastric bypass EXAM: CT ABDOMEN AND PELVIS WITHOUT CONTRAST TECHNIQUE: Multidetector CT imaging of the abdomen and pelvis was performed following the standard protocol without IV contrast. RADIATION DOSE REDUCTION: This exam was performed according to the departmental dose-optimization program which includes automated exposure control, adjustment of the mA and/or kV according to patient size and/or use of iterative reconstruction technique. COMPARISON:  04/04/2021, 04/12/2020 FINDINGS: Lower chest: No acute abnormality. Hepatobiliary: No focal liver abnormality is seen. Status post cholecystectomy. No biliary dilatation. Pancreas: Unremarkable. No pancreatic ductal dilatation or surrounding inflammatory changes. Spleen: Normal in size without focal abnormality. Adrenals/Urinary Tract: Adrenal glands are unremarkable.  Kidneys are normal, without renal calculi, focal lesion, or hydronephrosis. Bladder is unremarkable for the degree of distension. Stomach/Bowel: Postsurgical changes from prior gastric bypass. Enteric contrast present within the distal small bowel and colon. No dilated loops of bowel to suggest obstruction. Moderate volume stool throughout the colon. Diverticular changes of the colon. No focal bowel wall thickening or inflammatory changes. Vascular/Lymphatic: No significant vascular findings are present. No enlarged abdominal or pelvic lymph nodes. Reproductive: Status post hysterectomy. No adnexal masses. Other: No free fluid. No abdominopelvic fluid collection. No pneumoperitoneum. No abdominal wall hernia. Musculoskeletal: Asymmetric fullness of the left rectus musculature appears slightly more prominent compared to prior. A left-sided rectus sheath hematoma is not excluded. Prior L4-5 spinal fusion. 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 No acute bony findings. IMPRESSION: 1. Asymmetric fullness of the left rectus musculature appears slightly more prominent compared to prior CT. A left-sided rectus sheath hematoma is not excluded. 2. Moderate volume stool throughout the colon. No dilated loops of bowel to suggest obstruction. 3. Colonic diverticulosis without evidence of acute diverticulitis. 4. 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 Electronically Signed   By: Davina Poke D.O.   On: 08/11/2022 12:48   DG Cervical Spine 2 or 3 views  Result Date: 08/08/2022 CLINICAL DATA:  Left-sided neck pain.  No known injury EXAM: CERVICAL SPINE - 2-3 VIEW COMPARISON:  04/28/2020 FINDINGS: There is no evidence of cervical spine fracture or prevertebral soft tissue swelling. Straightening of the cervical lordosis without static listhesis. Multilevel disc height loss most pronounced at C2-3, C5-6, and C6-7.  IMPRESSION: 1. No acute osseous abnormality of the cervical spine. 2. Mild-moderate multilevel cervical spondylosis, similar to slightly progressed from prior. Electronically Signed   By: Davina Poke D.O.   On: 08/08/2022 14:11      Subjective:  Significant events today, she ambulated in the hallway with no deficits, appetite overall has improved, patient requesting to go home today. Discharge Exam: Vitals:   08/14/22 0836 08/14/22 1559  BP: 122/60 126/62  Pulse: 73 81  Resp: 19 20  Temp: 98 F (36.7 C) 98.7 F (37.1 C)  SpO2: 100% 100%   Vitals:   08/14/22 0000 08/14/22 0415 08/14/22 0836 08/14/22 1559  BP: 129/74 125/64 122/60 126/62  Pulse: 66 64 73 81  Resp: '18 14 19 20  '$ Temp: 98 F (36.7 C) 97.8 F (36.6 C) 98 F (36.7 C)  98.7 F (37.1 C)  TempSrc: Oral Oral Oral Oral  SpO2: 100% 99% 100% 100%  Weight:      Height:        General: Pt is alert, awake, not in acute distress frail,  Cardiovascular: RRR, S1/S2 +, no rubs, no gallops Respiratory: CTA bilaterally, no wheezing, no rhonchi Abdominal: Soft, NT, ND, bowel sounds + Extremities: no edema, no cyanosis    The results of significant diagnostics from this hospitalization (including imaging, microbiology, ancillary and laboratory) are listed below for reference.     Microbiology: No results found for this or any previous visit (from the past 240 hour(s)).   Labs: BNP (last 3 results) No results for input(s): "BNP" in the last 8760 hours. Basic Metabolic Panel: Recent Labs  Lab 08/08/22 1242 08/09/22 0404 08/10/22 0400 08/11/22 0617 08/12/22 0637 08/13/22 0332 08/14/22 0256  NA  --    < > 140 142 141 142 143  K  --    < > 4.2 4.2 4.0 3.3* 4.3  CL  --    < > 105 107 106 111 115*  CO2  --    < > '28 30 28 24 '$ 21*  GLUCOSE  --    < > 81 102* 80 97 158*  BUN  --    < > '13 17 17 16 14  '$ CREATININE  --    < > 1.35* 1.49* 1.53* 1.42* 1.35*  CALCIUM  --    < > 8.7* 8.9 8.7* 8.7* 8.9  MG 1.7  --  2.0   --  2.0  --  2.0  PHOS  --   --  4.7* 4.7* 4.4 4.2  --    < > = values in this interval not displayed.   Liver Function Tests: Recent Labs  Lab 08/08/22 0115 08/10/22 0400 08/14/22 0256  AST 43* 44* 111*  ALT 45* 46* 105*  ALKPHOS 123 129* 134*  BILITOT 0.2* 0.4 0.3  PROT 6.0* 6.0* 5.7*  ALBUMIN 3.2* 3.0* 2.9*   Recent Labs  Lab 08/08/22 0115  LIPASE 54*   No results for input(s): "AMMONIA" in the last 168 hours. CBC: Recent Labs  Lab 08/08/22 0115 08/09/22 0404 08/10/22 0400 08/12/22 0637 08/13/22 0332  WBC 7.9 6.8 7.9 6.9 8.7  NEUTROABS 5.5  --   --   --   --   HGB 10.2* 9.6* 10.8* 10.2* 9.4*  HCT 32.5* 30.2* 33.8* 31.6* 29.4*  MCV 86.7 84.8 85.6 83.8 84.5  PLT 224 193 221 236 223   Cardiac Enzymes: No results for input(s): "CKTOTAL", "CKMB", "CKMBINDEX", "TROPONINI" in the last 168 hours. BNP: Invalid input(s): "POCBNP" CBG: Recent Labs  Lab 08/14/22 0413 08/14/22 0610 08/14/22 0835 08/14/22 1118 08/14/22 1600  GLUCAP 102* 84 220* 130* 159*   D-Dimer No results for input(s): "DDIMER" in the last 72 hours. Hgb A1c No results for input(s): "HGBA1C" in the last 72 hours. Lipid Profile No results for input(s): "CHOL", "HDL", "LDLCALC", "TRIG", "CHOLHDL", "LDLDIRECT" in the last 72 hours. Thyroid function studies No results for input(s): "TSH", "T4TOTAL", "T3FREE", "THYROIDAB" in the last 72 hours.  Invalid input(s): "FREET3" Anemia work up No results for input(s): "VITAMINB12", "FOLATE", "FERRITIN", "TIBC", "IRON", "RETICCTPCT" in the last 72 hours. Urinalysis    Component Value Date/Time   COLORURINE YELLOW 08/08/2022 0350   APPEARANCEUR CLEAR 08/08/2022 0350   APPEARANCEUR Cloudy (A) 09/28/2019 1348   LABSPEC 1.015 08/08/2022 0350   LABSPEC 1.016 02/17/2014 2031   PHURINE 6.0  08/08/2022 0350   GLUCOSEU 50 (A) 08/08/2022 0350   GLUCOSEU Negative 02/17/2014 2031   HGBUR NEGATIVE 08/08/2022 0350   BILIRUBINUR NEGATIVE 08/08/2022 0350    BILIRUBINUR moderate 12/15/2020 1132   BILIRUBINUR Negative 09/28/2019 1348   BILIRUBINUR Negative 02/17/2014 2031   KETONESUR NEGATIVE 08/08/2022 0350   PROTEINUR NEGATIVE 08/08/2022 0350   UROBILINOGEN 2.0 (A) 12/15/2020 1132   UROBILINOGEN 0.2 11/15/2012 1507   NITRITE NEGATIVE 08/08/2022 0350   LEUKOCYTESUR MODERATE (A) 08/08/2022 0350   LEUKOCYTESUR Negative 02/17/2014 2031   Sepsis Labs Recent Labs  Lab 08/09/22 0404 08/10/22 0400 08/12/22 0637 08/13/22 0332  WBC 6.8 7.9 6.9 8.7   Microbiology No results found for this or any previous visit (from the past 240 hour(s)).   Time coordinating discharge: Over 30 minutes  SIGNED:   Phillips Climes, MD  [facility] 08/14/2022, 5:01 PM Pager   If 7PM-7AM, please contact night-coverage [email or web address] Password [email or web address]

## 2022-08-15 ENCOUNTER — Telehealth: Payer: Self-pay

## 2022-08-15 ENCOUNTER — Encounter: Payer: Self-pay | Admitting: Oncology

## 2022-08-15 ENCOUNTER — Telehealth: Payer: Self-pay | Admitting: Family Medicine

## 2022-08-15 DIAGNOSIS — E1122 Type 2 diabetes mellitus with diabetic chronic kidney disease: Secondary | ICD-10-CM

## 2022-08-15 MED ORDER — GLUCOSE BLOOD VI STRP: ORAL_STRIP | 12 refills | Status: DC

## 2022-08-15 NOTE — Telephone Encounter (Signed)
Transition Care Management Follow-up Telephone Call Date of discharge and from where: [facility] 08-14-22 Dx: hypoglycemia How have you been since you were released from the hospital? [facility] but blood sugar is running low- range 40-50 Any questions or concerns? Yes- noted above   Items Reviewed: Did the pt receive and understand the discharge instructions provided? Yes  Medications obtained and verified? Yes  Other? No  Any new allergies since your discharge? No  Dietary orders reviewed? Yes Do you have support at home? Yes   Home Care and Equipment/Supplies: Were home health services ordered? no If so, what is the name of the agency? na  Has the agency set up a time to come to the patient's home? not applicable Were any new equipment or medical supplies ordered?  No What is the name of the medical supply agency? na Were you able to get the supplies/equipment? not applicable Do you have any questions related to the use of the equipment or supplies? No  Functional Questionnaire: (I = Independent and D = Dependent) ADLs: I  Bathing/Dressing- I  Meal Prep- I  Eating- I  Maintaining continence- I  Transferring/Ambulation- I  Managing Meds- I  Follow up appointments reviewed:  PCP Hospital f/u appt confirmed? Yes  Scheduled to see Dr Rosanna Randy on 08-21-22 @ 240pm. [facility] f/u appt confirmed? No . Are transportation arrangements needed? No  If their condition worsens, is the pt aware to call PCP or go to the Emergency Dept.? Yes Was the patient provided with contact information for the PCP's office or ED? Yes Was to pt encouraged to call back with questions or concerns? Yes

## 2022-08-15 NOTE — Telephone Encounter (Signed)
Patient needs test strips sent in for One Touch Meter.  She is out and she just got out of the hospital. Julie Acevedo she was having to check BS often due to her sugars dropping down extremely low.    Send  to [facility] [address].

## 2022-08-15 NOTE — Telephone Encounter (Signed)
Rx was sent  

## 2022-08-16 ENCOUNTER — Other Ambulatory Visit: Payer: Self-pay | Admitting: *Deleted

## 2022-08-16 DIAGNOSIS — N1832 Chronic kidney disease, stage 3b: Secondary | ICD-10-CM

## 2022-08-16 MED ORDER — BLOOD GLUCOSE METER KIT: PACK | 0 refills | Status: AC

## 2022-08-17 ENCOUNTER — Telehealth: Payer: Self-pay

## 2022-08-17 NOTE — Progress Notes (Signed)
Chronic Care Management Pharmacy Assistant   Name: CHRISTEL BAI  MRN: [id number] DOB: Sep 09, 1951   Reason for Encounter: Diabetes Disease State Call/ER Follow-up   Recent office visits:  08/21/2022 Julieanne Manson, MD (PCP Office Visit) for Hospital follow-up- Started: Glucagon 1 mg subcutaneous prn, referral to Endocrinology placed   Recent consult visits:  07/30/2022 Armida Sans, MD (Dermatology) for Cyst- No medication changes noted, No orders placed,   07/19/2022 Thurmon Fair, MD (Cardiology) for Follow-up- Stopped: Doxycycline Hyclate 100 mg due to patient not taking, EKG 12-Lead order placed, patient to follow-up in 12 months  Hospital visits:  Medication Reconciliation was completed by comparing discharge summary, patient's EMR and Pharmacy list, and upon discussion with patient.  Admitted to the hospital on 08/21/2022 due to Hypoglycemia. Discharge date was 08/21/2022. Discharged from [facility].    New?Medications Started at [facility] Discharge:?? -START taking: polyethylene glycol powder 17 GM/SCOOP powder Take 1 scoop or 17 g 3 times a day for 2 days then use 1 scoop twice a day a day until you begin having soft bowel movements then cut back to 1 scoop a day.  Medication Changes at Hospital Discharge: -CHANGE how you take: None ID  Medications Discontinued at Hospital Discharge: -STOP taking: None ID  Medications that remain the same after Hospital Discharge:??  -All other medications will remain the same.     Medication Reconciliation was completed by comparing discharge summary, patient's EMR and Pharmacy list, and upon discussion with patient.  Admitted to the hospital on 08/21/2022 due to Hypoglycemia. Discharge date was 08/21/2022. Discharged from [facility].    New?Medications Started at [facility] Discharge:?? -START taking: None ID  Medication Changes at Hospital Discharge: -CHANGE how you take: None  ID  Medications Discontinued at Hospital Discharge: -STOP taking: None ID  Medications that remain the same after Hospital Discharge:??  -All other medications will remain the same.     Medication Reconciliation was completed by comparing discharge summary, patient's EMR and Pharmacy list, and upon discussion with patient.  Admitted to the hospital on 08/08/2022 due to Hypoglycemia. Discharge date was 08/14/2022. Discharged from [facility].    New?Medications Started at [facility] Discharge:?? -START taking: Copper tablet Take 1 tablet (2 mg total) by mouth daily. cyanocobalamin (VITAMIN B12) 500 MCG tablet Take 1 tablet (500 mcg total) by mouth daily. diazoxide (PROGLYCEM) 50 MG/ML suspension Take 1 mL (50 mg total) by mouth every 8 (eight) hours zinc gluconate Take 1 tablet (50 mg total) by mouth daily.  Medication Changes at Hospital Discharge: -CHANGE how you take: mirtazapine (REMERON) Take 1 tablet (30 mg total) by mouth at bedtime.  Medications Discontinued at Hospital Discharge: -STOP taking: metFORMIN 500 MG tablet (GLUCOPHAGE)  Medications that remain the same after Hospital Discharge:??  -All other medications will remain the same.    Medications: Outpatient Encounter Medications as of 08/17/2022  Medication Sig   atorvastatin (LIPITOR) 40 MG tablet Take 1 tablet (40 mg total) by mouth daily.   blood glucose meter kit and supplies Dispense Accu-Chek Guide. Use up to four times daily as directed. 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   calcitRIOL (ROCALTROL) 0.25 MCG capsule Take 0.25 mcg by mouth daily.   copper tablet Take 1 tablet (2 mg total) by mouth daily.   cyanocobalamin (VITAMIN B12) 500 MCG tablet Take 1 tablet (500 mcg total) by mouth daily.   diazoxide (PROGLYCEM) 50 MG/ML suspension Take 1 mL (50 mg  total) by mouth every 8 (eight) hours.   gabapentin (NEURONTIN) 300 MG capsule Take 1 capsule (300 mg total) by mouth 3 (three) times daily.    glucose blood test strip Use as instructed. OneTouch test strips.   hydrOXYzine (VISTARIL) 100 MG capsule Take 1 capsule (100 mg total) by mouth 3 (three) times daily.   linaclotide (LINZESS) 290 MCG CAPS capsule TAKE 1 CAPSULE BY MOUTH EVERY DAY BEFORE BREAKFAST (Patient taking differently: Take 290 mcg by mouth daily before breakfast.)   metoprolol succinate (TOPROL-XL) 25 MG 24 hr tablet Take 1 tablet (25 mg total) by mouth daily.   mirtazapine (REMERON) 30 MG tablet Take 1 tablet (30 mg total) by mouth at bedtime.   omeprazole (PRILOSEC) 20 MG capsule Take 1 capsule (20 mg total) by mouth daily.   ondansetron (ZOFRAN) 4 MG tablet TAKE 1 TABLET BY MOUTH EVERY 8 HOURS AS NEEDED (Patient taking differently: Take 4 mg by mouth every 8 (eight) hours as needed for nausea or vomiting.)   traMADol (ULTRAM) 50 MG tablet Take 1 tablet (50 mg total) by mouth every 6 (six) hours as needed. (Patient taking differently: Take 50 mg by mouth every 6 (six) hours as needed for moderate pain or severe pain.)   zinc gluconate 50 MG tablet Take 1 tablet (50 mg total) by mouth daily.   No facility-administered encounter medications on file as of 08/17/2022.   Care Gaps: Diabetic Eye Exam Diabetic Foot Exam Influenza Vaccine  Star Rating Drugs: Atorvastatin 40 mg last filled on 06/27/2022 for a 87-Day supply with [facility]  Recent Relevant Labs: Lab Results  Component Value Date/Time   HGBA1C 5.4 08/09/2022 06:40 PM   HGBA1C 5.1 02/13/2022 02:36 PM   HGBA1C 5.6 09/06/2021 04:43 AM   HGBA1C 6.3 01/18/2014 06:32 AM   MICROALBUR 20 06/23/2020 02:10 PM   MICROALBUR 50 11/18/2017 09:33 AM    Kidney Function Lab Results  Component Value Date/Time   CREATININE 1.35 (H) 08/14/2022 02:56 AM   CREATININE 1.42 (H) 08/13/2022 03:32 AM   CREATININE 1.35 (H) 10/30/2017 10:58 AM   CREATININE 1.15 (H) 08/21/2017 03:32 PM   GFRNONAA 43 (L) 08/14/2022 02:56 AM   GFRNONAA 41 (L) 10/30/2017 10:58 AM   GFRAA 42  (L) 12/05/2020 08:55 AM   GFRAA 48 (L) 10/30/2017 10:58 AM   Current antihyperglycemic regimen:  None ID What recent interventions/DTPs have been made to improve glycemic control:  Patient was recently discontinued from Metformin  Have there been any recent hospitalizations or ED visits since last visit with CPP? No Patient reports hypoglycemic symptoms, including None Patient denies hyperglycemic symptoms, including blurry vision, excessive thirst, fatigue, polyuria, and weakness How often are you checking your blood sugar? in the morning before eating or drinking What are your blood sugars ranging?  Fasting: Patient has not taken her blood sugars in a few days. The patient hasn't been feeling well as she is currently suffering from   During the week, how often does your blood glucose drop below 70?  Patient was having issues with low blood sugars within the last few months that landed her in the hospital. Patient was discontinued from Metformin due to her low blood sugars  Are you checking your feet daily/regularly? Yes  Adherence Review: Is the patient currently on a STATIN medication? Yes Is the patient currently on ACE/ARB medication? No Does the patient have >5 day gap between last estimated fill dates? No   I spoke with the patient and she  reports that she isn't feeling well today. Per patient she is still constipated and she is in pain. Patient went to the ED for her constipation and they started her on Miralax. Per patient the Miralax isn't working, she has OTC stool softeners that isn't working, she is drinking more water, and she even went for a walk hoping to have a bowel movement. Patient stated the ER doctors couldn't give her an in enema due to impaction. She stated they did manually remove as much as they could but that didn't work either.  Patient stated she is going to get OTC Magnesium in pill form to see if that helps. Patient stated she would call me back tomorrow if  that doesn't assist her with a bowel movement.  I educated patient to also increase her fluid intake as when she is taking the medications drinking plenty of water should help with getting the outcome she is looking for. I encouraged the patient to give me a call tomorrow if nothing changes.  Patient was also advised she needs to have a follow-up with Urology in order for them to refill her Calcium medications. Patient verbalized understanding.   Patient has a telephone follow-up appointment with [name], CPP on 10/19/2022 @ 1100.  Lynann Bologna, CPA/CMA Clinical Pharmacist Assistant Phone: [phone]

## 2022-08-19 LAB — PROINSULIN/INSULIN RATIO
Insulin: 22 u[IU]/mL — ABNORMAL HIGH
Proinsulin: 53.5 pmol/L — ABNORMAL HIGH

## 2022-08-21 ENCOUNTER — Encounter: Payer: Self-pay | Admitting: Radiology

## 2022-08-21 ENCOUNTER — Ambulatory Visit (INDEPENDENT_AMBULATORY_CARE_PROVIDER_SITE_OTHER): Payer: Medicare Other | Admitting: Family Medicine

## 2022-08-21 ENCOUNTER — Other Ambulatory Visit: Payer: Self-pay

## 2022-08-21 ENCOUNTER — Emergency Department
Admission: EM | Admit: 2022-08-21 | Discharge: 2022-08-21 | Disposition: A | Discharge status: Home / Self Care | Payer: Medicare Other | Attending: Emergency Medicine | Admitting: Emergency Medicine

## 2022-08-21 ENCOUNTER — Emergency Department: Payer: Medicare Other

## 2022-08-21 ENCOUNTER — Encounter: Payer: Self-pay | Admitting: Family Medicine

## 2022-08-21 VITALS — BP 120/84 | HR 64 | Resp 18 | Wt 128.9 lb

## 2022-08-21 DIAGNOSIS — E782 Mixed hyperlipidemia: Secondary | ICD-10-CM

## 2022-08-21 DIAGNOSIS — G3183 Dementia with Lewy bodies: Secondary | ICD-10-CM | POA: Diagnosis not present

## 2022-08-21 DIAGNOSIS — E440 Moderate protein-calorie malnutrition: Secondary | ICD-10-CM

## 2022-08-21 DIAGNOSIS — F0390 Unspecified dementia without behavioral disturbance: Secondary | ICD-10-CM | POA: Insufficient documentation

## 2022-08-21 DIAGNOSIS — R404 Transient alteration of awareness: Secondary | ICD-10-CM | POA: Insufficient documentation

## 2022-08-21 DIAGNOSIS — E161 Other hypoglycemia: Secondary | ICD-10-CM | POA: Diagnosis not present

## 2022-08-21 DIAGNOSIS — D509 Iron deficiency anemia, unspecified: Secondary | ICD-10-CM

## 2022-08-21 DIAGNOSIS — E162 Hypoglycemia, unspecified: Secondary | ICD-10-CM | POA: Diagnosis not present

## 2022-08-21 DIAGNOSIS — I10 Essential (primary) hypertension: Secondary | ICD-10-CM | POA: Diagnosis not present

## 2022-08-21 DIAGNOSIS — N1832 Chronic kidney disease, stage 3b: Secondary | ICD-10-CM | POA: Diagnosis not present

## 2022-08-21 DIAGNOSIS — R7989 Other specified abnormal findings of blood chemistry: Secondary | ICD-10-CM | POA: Diagnosis not present

## 2022-08-21 DIAGNOSIS — Z9884 Bariatric surgery status: Secondary | ICD-10-CM

## 2022-08-21 DIAGNOSIS — F02A0 Dementia in other diseases classified elsewhere, mild, without behavioral disturbance, psychotic disturbance, mood disturbance, and anxiety: Secondary | ICD-10-CM | POA: Diagnosis not present

## 2022-08-21 DIAGNOSIS — Z0389 Encounter for observation for other suspected diseases and conditions ruled out: Secondary | ICD-10-CM | POA: Diagnosis not present

## 2022-08-21 DIAGNOSIS — E1122 Type 2 diabetes mellitus with diabetic chronic kidney disease: Secondary | ICD-10-CM | POA: Diagnosis not present

## 2022-08-21 DIAGNOSIS — E042 Nontoxic multinodular goiter: Secondary | ICD-10-CM

## 2022-08-21 DIAGNOSIS — E11649 Type 2 diabetes mellitus with hypoglycemia without coma: Secondary | ICD-10-CM | POA: Diagnosis not present

## 2022-08-21 DIAGNOSIS — R410 Disorientation, unspecified: Secondary | ICD-10-CM | POA: Diagnosis not present

## 2022-08-21 LAB — CBC WITH DIFFERENTIAL/PLATELET
Abs Immature Granulocytes: 0.02 10*3/uL (ref 0.00–0.07)
Basophils Absolute: 0 10*3/uL (ref 0.0–0.1)
Basophils Relative: 0 %
Eosinophils Absolute: 0.1 10*3/uL (ref 0.0–0.5)
Eosinophils Relative: 2 %
HCT: 28.2 % — ABNORMAL LOW (ref 36.0–46.0)
Hemoglobin: 8.5 g/dL — ABNORMAL LOW (ref 12.0–15.0)
Immature Granulocytes: 0 %
Lymphocytes Relative: 19 %
Lymphs Abs: 1.4 10*3/uL (ref 0.7–4.0)
MCH: 26.1 pg (ref 26.0–34.0)
MCHC: 30.1 g/dL (ref 30.0–36.0)
MCV: 86.5 fL (ref 80.0–100.0)
Monocytes Absolute: 0.6 10*3/uL (ref 0.1–1.0)
Monocytes Relative: 8 %
Neutro Abs: 5.3 10*3/uL (ref 1.7–7.7)
Neutrophils Relative %: 71 %
Platelets: 270 10*3/uL (ref 150–400)
RBC: 3.26 MIL/uL — ABNORMAL LOW (ref 3.87–5.11)
RDW: 13.8 % (ref 11.5–15.5)
WBC: 7.5 10*3/uL (ref 4.0–10.5)
nRBC: 0 % (ref 0.0–0.2)

## 2022-08-21 LAB — BLOOD GAS, VENOUS
Acid-Base Excess: 3.2 mmol/L — ABNORMAL HIGH (ref 0.0–2.0)
Bicarbonate: 29.9 mmol/L — ABNORMAL HIGH (ref 20.0–28.0)
O2 Saturation: 66.5 %
Patient temperature: 37
pCO2, Ven: 53 mmHg (ref 44–60)
pH, Ven: 7.36 (ref 7.25–7.43)
pO2, Ven: 38 mmHg (ref 32–45)

## 2022-08-21 LAB — COMPREHENSIVE METABOLIC PANEL
ALT: 129 U/L — ABNORMAL HIGH (ref 0–44)
AST: 132 U/L — ABNORMAL HIGH (ref 15–41)
Albumin: 3 g/dL — ABNORMAL LOW (ref 3.5–5.0)
Alkaline Phosphatase: 141 U/L — ABNORMAL HIGH (ref 38–126)
Anion gap: 4 — ABNORMAL LOW (ref 5–15)
BUN: 16 mg/dL (ref 8–23)
CO2: 26 mmol/L (ref 22–32)
Calcium: 8.5 mg/dL — ABNORMAL LOW (ref 8.9–10.3)
Chloride: 112 mmol/L — ABNORMAL HIGH (ref 98–111)
Creatinine, Ser: 1.37 mg/dL — ABNORMAL HIGH (ref 0.44–1.00)
GFR, Estimated: 42 mL/min — ABNORMAL LOW (ref >60)
Glucose, Bld: 193 mg/dL — ABNORMAL HIGH (ref 70–99)
Potassium: 3.5 mmol/L (ref 3.5–5.1)
Sodium: 142 mmol/L (ref 135–145)
Total Bilirubin: 0.4 mg/dL (ref 0.3–1.2)
Total Protein: 5.9 g/dL — ABNORMAL LOW (ref 6.5–8.1)

## 2022-08-21 LAB — LACTIC ACID, PLASMA: Lactic Acid, Venous: 1.1 mmol/L (ref 0.5–1.9)

## 2022-08-21 LAB — CBG MONITORING, ED
Glucose-Capillary: 200 mg/dL — ABNORMAL HIGH (ref 70–99)
Glucose-Capillary: 73 mg/dL (ref 70–99)
Glucose-Capillary: 118 mg/dL — ABNORMAL HIGH (ref 70–99)

## 2022-08-21 LAB — LIPASE, BLOOD: Lipase: 50 U/L (ref 11–51)

## 2022-08-21 MED ORDER — GVOKE HYPOPEN 2-PACK 1 MG/0.2ML ~~LOC~~ SOAJ: 0.2000 mL | SUBCUTANEOUS | 2 refills | Status: DC | PRN

## 2022-08-21 NOTE — Discharge Instructions (Signed)
As we discussed, your blood sugar dropped too low today.  We recommend that you eat small snacks and keep a very careful watch on your blood sugar.  Follow-up with your regular doctor at the next available opportunity.  Return to the emergency department with new or worsening symptoms that concern you. ° ° °

## 2022-08-21 NOTE — ED Provider Notes (Signed)
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  She presents tonight by EMS for altered mental status and concern for hypoglycemia.  Her family told EMS that she was not acting like herself tonight.  Her libre arm but goes monitor was reporting blood glucose levels in the 50s.  EMS arrived and their readings were higher in the low 100s, but given the patient's altered mental status they requested medical control to allow for D10 administration in route to the hospital.  I authorized it over the radio.  At the time the patient arrived she is awake and alert and in no distress.  She does not know exactly why she is here.  She said she had a normal day today and ate cookout.  She said she is no longer taking any diabetes medicines because she was just recently hospitalized and discharged after problems with her blood sugar.  She said that she has no pain and no shortness of breath.     Physical Exam   ED Triage Vitals [08/21/22 0348]  Enc Vitals Group     BP (!) 138/106     Pulse Rate 61     Resp 20     Temp 98 F (36.7 C)     Temp Source Oral     SpO2 100 %     Weight 55.8 kg (123 lb)     Height 1.6 m ('5\' 3"'$ )     Head Circumference      Peak Flow      Pain Score 0     Pain Loc      Pain Edu?      Excl. in [place]?       Most recent vital signs: Vitals:   08/21/22 0348 08/21/22 0600  BP: (!) 138/106 120/81  Pulse: 61 79  Resp: 20 15  Temp: 98 F (36.7 C)   SpO2: 100% 100%     General: Awake, no distress.  CV:  Good peripheral perfusion.  Normal heart sounds. Resp:  Normal effort.  Lungs are clear to  auscultation bilaterally. Abd:  Appears chronically malnourished but not dangerously so.  No tenderness to palpation of the abdomen. Other:  Patient is awake and alert, answering questions appropriately.  Mental status seems to be at baseline based on prior documentation.   ED Results / Procedures / Treatments   Labs (all labs ordered are listed, but only abnormal results are displayed) Labs Reviewed  COMPREHENSIVE METABOLIC PANEL - Abnormal; Notable for the following components:      Result Value   Chloride 112 (*)    Glucose, Bld 193 (*)    Creatinine, Ser 1.37 (*)    Calcium 8.5 (*)    Total Protein 5.9 (*)    Albumin 3.0 (*)    AST 132 (*)    ALT 129 (*)    Alkaline Phosphatase 141 (*)    GFR, Estimated 42 (*)    Anion gap 4 (*)    All other components within normal limits  CBC WITH DIFFERENTIAL/PLATELET - Abnormal; Notable for the following components:   RBC  3.26 (*)    Hemoglobin 8.5 (*)    HCT 28.2 (*)    All other components within normal limits  BLOOD GAS, VENOUS - Abnormal; Notable for the following components:   Bicarbonate 29.9 (*)    Acid-Base Excess 3.2 (*)    All other components within normal limits  CBG MONITORING, ED - Abnormal; Notable for the following components:   Glucose-Capillary 200 (*)    All other components within normal limits  CBG MONITORING, ED - Abnormal; Notable for the following components:   Glucose-Capillary 118 (*)    All other components within normal limits  URINE CULTURE  LACTIC ACID, PLASMA  LIPASE, BLOOD  URINALYSIS, ROUTINE W REFLEX MICROSCOPIC  CBG MONITORING, ED     EKG  ED ECG REPORT I, Hinda Kehr, the attending physician, personally viewed and interpreted this ECG.  Date: 08/21/2022 EKG Time: 3:41 AM Rate: 64 Rhythm: normal sinus rhythm QRS Axis: normal Intervals: normal ST/T Wave abnormalities: normal Narrative Interpretation: no evidence of acute ischemia    RADIOLOGY See hospital course for details.   No acute abnormalities on chest x-ray.    PROCEDURES:  Critical Care performed: No  Procedures   MEDICATIONS ORDERED IN ED: Medications - No data to display   IMPRESSION / MDM / [place] / ED COURSE  I reviewed the triage vital signs and the nursing notes.                              Differential diagnosis includes, but is not limited to, chronic malabsorption of nutrients, device malfunction, hypoglycemia, electrolyte or metabolic abnormality, sepsis/infection.  Patient's presentation is most consistent with acute presentation with potential threat to life or bodily function.  The patient is at her baseline now.  Reportedly she was more somnolent or altered previously but the circumstances are unclear.  Her glucose monitoring device was recording measurements in the 50s even though the paramedics' glucometer was showing greater than 100.  Given the question of symptomatic hypoglycemia, I authorize D10 administration.  Upon arrival in the ED we will check a CBG.  Other labs/studies ordered include lactic acid, lipase, urinalysis, VBG, urine culture, comprehensive metabolic panel, CBC with differential.  Her monitor is still in place and the remote device is also at bedside.  Her nurse and I try to use it and it is not functioning correctly.  I wonder if this was mostly an issue of device malfunction.  Regardless we will work her up and look for any acute or emergent abnormalities.  I viewed and interpreted the medical record including the discharge summary written by Dr. Waldron Labs on 08/14/2022.  His note is very detailed and mentions a hospitalization during which she had hypoglycemia and required D10 infusions but she had been maintaining her glucose level for about 24 hours prior to discharge.  The nutritionist had worked with her about ways to maintain her blood glucose level.  They also documented her cognitive impairment and a slowness to respond even though her responses  are generally appropriate.  She is followed by [facility] neurology with a diagnosis of mild to moderate [name].  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  This information is very helpful in establishing her baseline and what issues may be going on and what we should check today.  The patient is on the cardiac monitor to evaluate for evidence  of arrhythmia and/or significant heart rate changes.   Clinical Course as of 08/21/22 0642  Tue Aug 21, 2022  0340 POC CBG, ED CBG obtained and visualized by me while I was in the room was 200 after the patient received D10 in route to the hospital. [CF]  0421 CBC with Differential(!) Hemoglobin is down a little bit but still acceptable.  No leukocytosis. [CF]  0426 Comprehensive metabolic panel(!) CMP is stable from prior.  Glucose is 193, creatinine is stable at 1.37, LFTs chronically elevated but stable. [CF]  0505 Blood gas, venous(!) VBG reassuring, no acute abnormalities [CF]  0505 Lipase: 50 [CF]  0505 Lactic Acid, Venous: 1.1 Normal lactic acid [CF]  0537 Glucose-Capillary: 73 Relatively rapid drop of blood glucose.  Unchanged mental status.  Encouraging patient to eat and drink and see if glucose will stabilize. [CF]  0552 DG Chest [facility] I viewed and interpreted the patient's 1 view chest x-ray.  I see no evidence of pneumonia, widened mediastinum, nor other acute abnormality.  I also read the radiologist's report, which confirmed no acute findings. [CF]  1610 Glucose-Capillary(!): 118 Blood sugars come back up to 118.  Patient remains unchanged.  Husband is at bedside.  Patient very much wants to go home.  I think that is reasonable.  Husband also wants to take her home and he said he will encourage her to eat.  We had a talk about eating complex carbohydrates, peanut butter and jelly sandwiches, [place] sandwiches with cheese, etc.  The patient says she understands.   The husband told me that they have a follow-up appointment with her PCP today which is reassuring.  I gave strict management recommendations and return precautions as well as follow-up recommendations. [CF]    Clinical Course User Index [CF] Hinda Kehr, MD     FINAL CLINICAL IMPRESSION(S) / ED DIAGNOSES   Final diagnoses:  Hypoglycemia  Transient alteration of awareness     Rx / DC Orders   ED Discharge Orders     None        Note:  This document was prepared using Dragon voice recognition software and may include unintentional dictation errors.   Hinda Kehr, MD 08/21/22 [phone]

## 2022-08-21 NOTE — Progress Notes (Signed)
Established patient visit  I,[name],acting as a scribe for [name], MD.,have documented all relevant documentation on the behalf of [name], MD,as directed by  [name], MD while in the presence of [name], MD.   Patient: Julie Acevedo   DOB: 12-15-1951   71 y.o. Female  MRN: [id number] Visit Date: 08/21/2022  Today's healthcare provider: [name], MD   Chief Complaint  Patient presents with   Hospitalization Follow-up   Subjective    HPI  Previous ED and hospital visits reviewed.  The ED felt that she might have an inaccurate glucometer .  Previous visits to the hospital revealed a high proinsulin level.  Follow up Hospitalization  Patient was admitted to [facility] on 08/08/22 and discharged on 08/14/22. She was treated for hypoglycemia.  ----------------------------------------------------------------------------------------- -   Patient's blood sugar has been dropping down to 40's. Patient states she feels weak.   Medications: Outpatient Medications Prior to Visit  Medication Sig   atorvastatin (LIPITOR) 40 MG tablet Take 1 tablet (40 mg total) by mouth daily.   blood glucose meter kit and supplies Dispense Accu-Chek Guide. Use up to four times daily as directed. 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   calcitRIOL (ROCALTROL) 0.25 MCG capsule Take 0.25 mcg by mouth daily.   copper tablet Take 1 tablet (2 mg total) by mouth daily.   cyanocobalamin (VITAMIN B12) 500 MCG tablet Take 1 tablet (500 mcg total) by mouth daily.   diazoxide (PROGLYCEM) 50 MG/ML suspension Take 1 mL (50 mg total) by mouth every 8 (eight) hours.   gabapentin (NEURONTIN) 300 MG capsule Take 1 capsule (300 mg total) by mouth 3 (three) times daily.   glucose blood test strip Use as instructed. OneTouch test strips.   hydrOXYzine (VISTARIL) 100 MG capsule Take 1 capsule (100 mg total) by mouth 3 (three) times daily.   linaclotide (LINZESS) 290 MCG  CAPS capsule TAKE 1 CAPSULE BY MOUTH EVERY DAY BEFORE BREAKFAST (Patient taking differently: Take 290 mcg by mouth daily before breakfast.)   metoprolol succinate (TOPROL-XL) 25 MG 24 hr tablet Take 1 tablet (25 mg total) by mouth daily.   mirtazapine (REMERON) 30 MG tablet Take 1 tablet (30 mg total) by mouth at bedtime.   omeprazole (PRILOSEC) 20 MG capsule Take 1 capsule (20 mg total) by mouth daily.   ondansetron (ZOFRAN) 4 MG tablet TAKE 1 TABLET BY MOUTH EVERY 8 HOURS AS NEEDED (Patient taking differently: Take 4 mg by mouth every 8 (eight) hours as needed for nausea or vomiting.)   traMADol (ULTRAM) 50 MG tablet Take 1 tablet (50 mg total) by mouth every 6 (six) hours as needed. (Patient taking differently: Take 50 mg by mouth every 6 (six) hours as needed for moderate pain or severe pain.)   zinc gluconate 50 MG tablet Take 1 tablet (50 mg total) by mouth daily.   No facility-administered medications prior to visit.    Review of Systems  Last hemoglobin A1c Lab Results  Component Value Date   HGBA1C 5.4 08/09/2022       Objective    BP 120/84 (BP Location: Right Arm, Patient Position: Sitting, Cuff Size: Normal)   Pulse 64   Resp 18   Wt 128 lb 14.4 oz (58.5 kg)   SpO2 99%   BMI 22.83 kg/m  BP Readings from Last 3 Encounters:  08/21/22 120/84  08/21/22 128/66  08/14/22 126/62   Wt Readings from Last  3 Encounters:  08/21/22 128 lb 14.4 oz (58.5 kg)  08/21/22 123 lb (55.8 kg)  08/08/22 125 lb 3.5 oz (56.8 kg)      Physical Exam Vitals reviewed.  Constitutional:      General: She is not in acute distress.    Appearance: She is well-developed.  HENT:     Head: Normocephalic and atraumatic.     Right Ear: Hearing normal.     Left Ear: Hearing normal.     Nose: Nose normal.  Eyes:     General: Lids are normal. No scleral icterus.       Right eye: No discharge.        Left eye: No discharge.     Conjunctiva/sclera: Conjunctivae normal.  Cardiovascular:      Rate and Rhythm: Normal rate and regular rhythm.     Heart sounds: Normal heart sounds.  Pulmonary:     Effort: Pulmonary effort is normal. No respiratory distress.  Skin:    General: Skin is warm and dry.     Findings: No lesion or rash.  Neurological:     General: No focal deficit present.     Mental Status: She is alert.  Psychiatric:        Mood and Affect: Mood normal.        Speech: Speech normal.        Behavior: Behavior normal.       Results for orders placed or performed during the hospital encounter of 08/21/22  Lactic acid, plasma  Result Value Ref Range   Lactic Acid, Venous 1.1 0.5 - 1.9 mmol/L  Comprehensive metabolic panel  Result Value Ref Range   Sodium 142 135 - 145 mmol/L   Potassium 3.5 3.5 - 5.1 mmol/L   Chloride 112 (H) 98 - 111 mmol/L   CO2 26 22 - 32 mmol/L   Glucose, Bld 193 (H) 70 - 99 mg/dL   BUN 16 8 - 23 mg/dL   Creatinine, Ser 1.37 (H) 0.44 - 1.00 mg/dL   Calcium 8.5 (L) 8.9 - 10.3 mg/dL   Total Protein 5.9 (L) 6.5 - 8.1 g/dL   Albumin 3.0 (L) 3.5 - 5.0 g/dL   AST 132 (H) 15 - 41 U/L   ALT 129 (H) 0 - 44 U/L   Alkaline Phosphatase 141 (H) 38 - 126 U/L   Total Bilirubin 0.4 0.3 - 1.2 mg/dL   GFR, Estimated 42 (L) >60 mL/min   Anion gap 4 (L) 5 - 15  CBC with Differential  Result Value Ref Range   WBC 7.5 4.0 - 10.5 K/uL   RBC 3.26 (L) 3.87 - 5.11 MIL/uL   Hemoglobin 8.5 (L) 12.0 - 15.0 g/dL   HCT 28.2 (L) 36.0 - 46.0 %   MCV 86.5 80.0 - 100.0 fL   MCH 26.1 26.0 - 34.0 pg   MCHC 30.1 30.0 - 36.0 g/dL   RDW 13.8 11.5 - 15.5 %   Platelets 270 150 - 400 K/uL   nRBC 0.0 0.0 - 0.2 %   Neutrophils Relative % 71 %   Neutro Abs 5.3 1.7 - 7.7 K/uL   Lymphocytes Relative 19 %   Lymphs Abs 1.4 0.7 - 4.0 K/uL   Monocytes Relative 8 %   Monocytes Absolute 0.6 0.1 - 1.0 K/uL   Eosinophils Relative 2 %   Eosinophils Absolute 0.1 0.0 - 0.5 K/uL   Basophils Relative 0 %   Basophils Absolute 0.0 0.0 - 0.1 K/uL   Immature  Granulocytes 0 %   Abs  Immature Granulocytes 0.02 0.00 - 0.07 K/uL  Lipase, blood  Result Value Ref Range   Lipase 50 11 - 51 U/L  Blood gas, venous  Result Value Ref Range   pH, Ven 7.36 7.25 - 7.43   pCO2, Ven 53 44 - 60 mmHg   pO2, Ven 38 32 - 45 mmHg   Bicarbonate 29.9 (H) 20.0 - 28.0 mmol/L   Acid-Base Excess 3.2 (H) 0.0 - 2.0 mmol/L   O2 Saturation 66.5 %   Patient temperature 37.0    Collection site VEIN   POC CBG, ED  Result Value Ref Range   Glucose-Capillary 200 (H) 70 - 99 mg/dL  CBG monitoring, ED  Result Value Ref Range   Glucose-Capillary 73 70 - 99 mg/dL  CBG monitoring, ED  Result Value Ref Range   Glucose-Capillary 118 (H) 70 - 99 mg/dL    Assessment & Plan     1. Hypoglycemia Prescription is written for glucagon pens as needed for emergent hypoglycemia. - Ambulatory referral to Endocrinology - Glucagon (GVOKE HYPOPEN 2-PACK) 1 MG/0.2ML SOAJ; Inject 0.2 mLs into the skin as needed.  Dispense: 0.2 mL; Refill: 2  2. High plasma proinsulin Refer to endocrinology.  Proinsulin level was elevated the hospital,, this was drawn on 08/12/2022 - Ambulatory referral to Endocrinology  3. Type 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, without long-term current use of insulin (HCC) Avoid last A1c under excellent control at 5.4.  She should not be on any medications that cause hypoglycemia.  He is lifestyle only for her diabetes at this time  4. 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 Mixed hyperlipidemia   6. Benign essential HTN   7. Stage 3b chronic kidney disease (HCC) Avoid anti-inflammatories  8. Multiple thyroid nodules   9. Gastric bypass status for obesity   10. Iron deficiency anemia, unspecified iron deficiency anemia type   11. Malnutrition of moderate degree   No follow-ups on file.      I, [name], MD, have reviewed all documentation  for this visit. The documentation on 08/23/22 for the exam, diagnosis, procedures, and orders are all accurate and complete.    Egbert Seidel Cranford Mon, MD  [facility] [phone] (phone) [phone] (fax)  [place]

## 2022-08-21 NOTE — ED Triage Notes (Signed)
Arrived via EMS for hypoglycemia. Patient with altered mental status on EMS arrival, oriented to name only 20 G L FA. 25G D10 given prior to arrival. Patient now AOX4, resp even, unlabored on RA. Denies pain.

## 2022-08-27 ENCOUNTER — Other Ambulatory Visit: Payer: Self-pay | Admitting: Family Medicine

## 2022-08-27 DIAGNOSIS — M5417 Radiculopathy, lumbosacral region: Secondary | ICD-10-CM

## 2022-08-30 ENCOUNTER — Emergency Department: Payer: Medicare Other

## 2022-08-30 ENCOUNTER — Emergency Department
Admission: EM | Admit: 2022-08-30 | Discharge: 2022-08-30 | Discharge status: Against Medical Advice | Payer: Medicare Other | Attending: Emergency Medicine | Admitting: Emergency Medicine

## 2022-08-30 ENCOUNTER — Other Ambulatory Visit: Payer: Self-pay

## 2022-08-30 ENCOUNTER — Encounter: Payer: Self-pay | Admitting: *Deleted

## 2022-08-30 DIAGNOSIS — Z5321 Procedure and treatment not carried out due to patient leaving prior to being seen by health care provider: Secondary | ICD-10-CM | POA: Diagnosis not present

## 2022-08-30 DIAGNOSIS — K5900 Constipation, unspecified: Secondary | ICD-10-CM | POA: Diagnosis not present

## 2022-08-30 LAB — COMPREHENSIVE METABOLIC PANEL
ALT: 58 U/L — ABNORMAL HIGH (ref 0–44)
AST: 57 U/L — ABNORMAL HIGH (ref 15–41)
Albumin: 3.7 g/dL (ref 3.5–5.0)
Alkaline Phosphatase: 161 U/L — ABNORMAL HIGH (ref 38–126)
Anion gap: 11 (ref 5–15)
BUN: 18 mg/dL (ref 8–23)
CO2: 27 mmol/L (ref 22–32)
Calcium: 9 mg/dL (ref 8.9–10.3)
Chloride: 106 mmol/L (ref 98–111)
Creatinine, Ser: 1.64 mg/dL — ABNORMAL HIGH (ref 0.44–1.00)
GFR, Estimated: 34 mL/min — ABNORMAL LOW (ref >60)
Glucose, Bld: 111 mg/dL — ABNORMAL HIGH (ref 70–99)
Potassium: 4.3 mmol/L (ref 3.5–5.1)
Sodium: 144 mmol/L (ref 135–145)
Total Bilirubin: 0.5 mg/dL (ref 0.3–1.2)
Total Protein: 7.2 g/dL (ref 6.5–8.1)

## 2022-08-30 LAB — LIPASE, BLOOD: Lipase: 43 U/L (ref 11–51)

## 2022-08-30 IMAGING — US US ABDOMEN COMPLETE
1 series · 14 of 25 positions shown · non-contrast
Comparison: None.

CLINICAL DATA: Abdominal pain for 1 month.  Prior cholecystectomy.

EXAM:
ABDOMEN ULTRASOUND COMPLETE

[Series 1: us abdomen complete · 0.15mm/px · 14 of 74 slices shown]
[im 1/74]
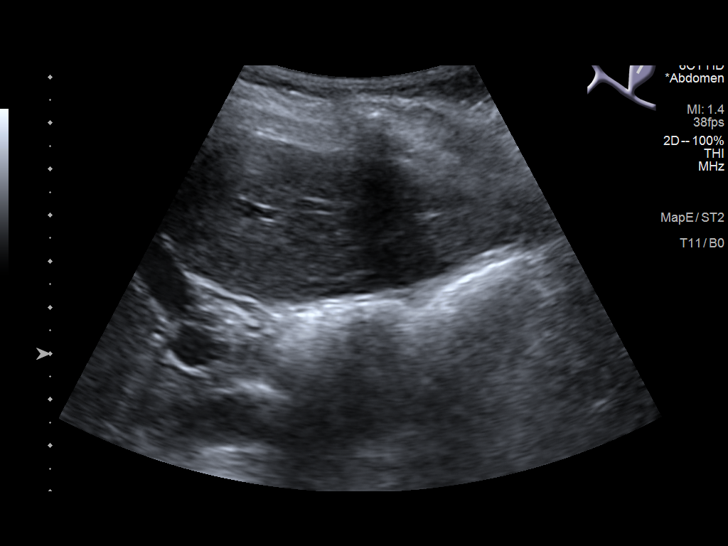
[im 7/74]
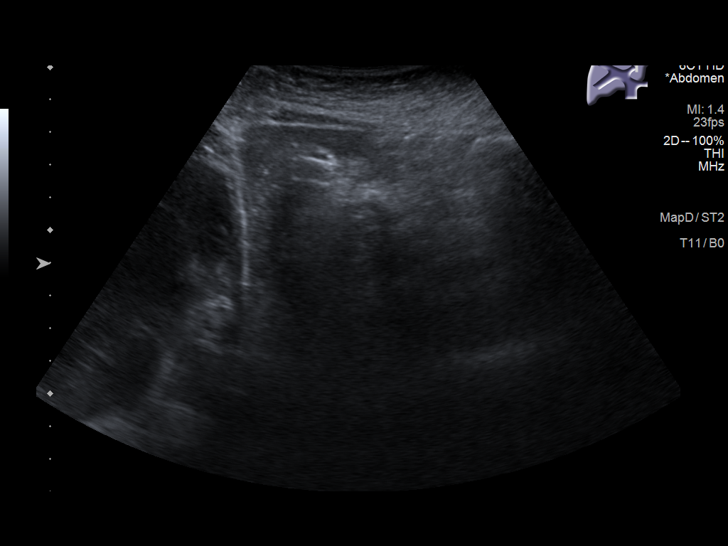
[im 13/74]
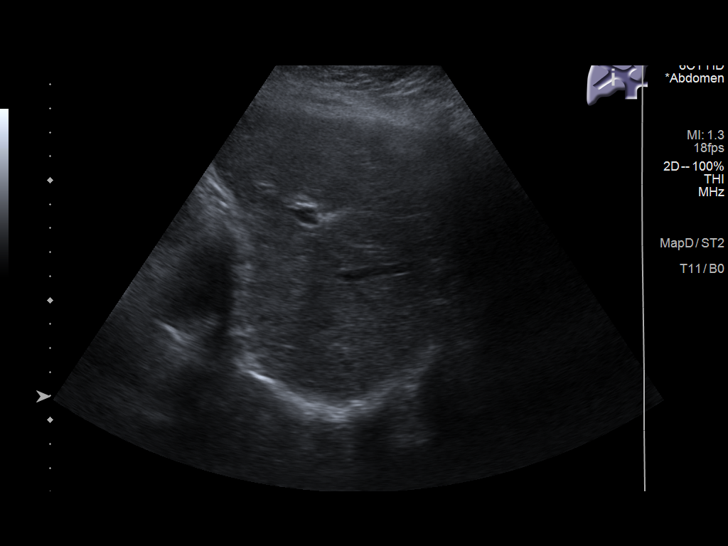
[im 19/74]
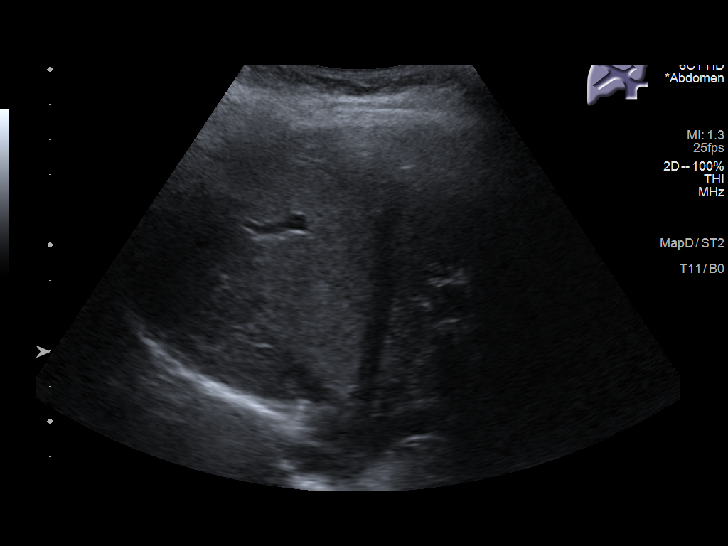
[im 25/74]
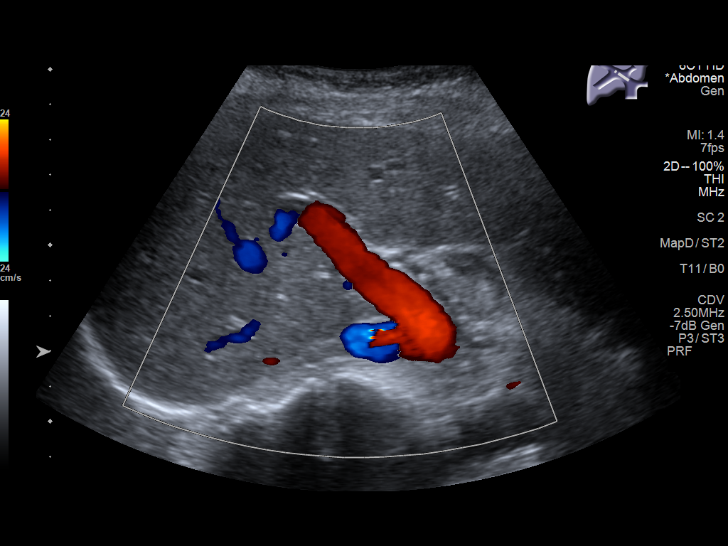
[im 28/74]
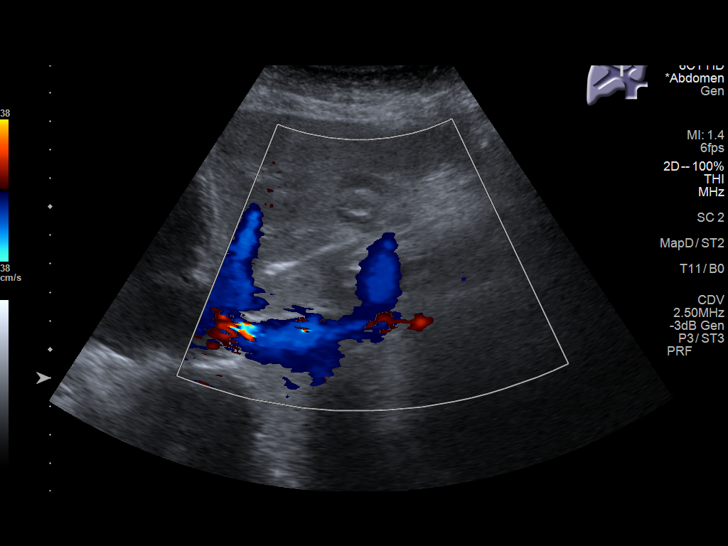
[im 34/74]
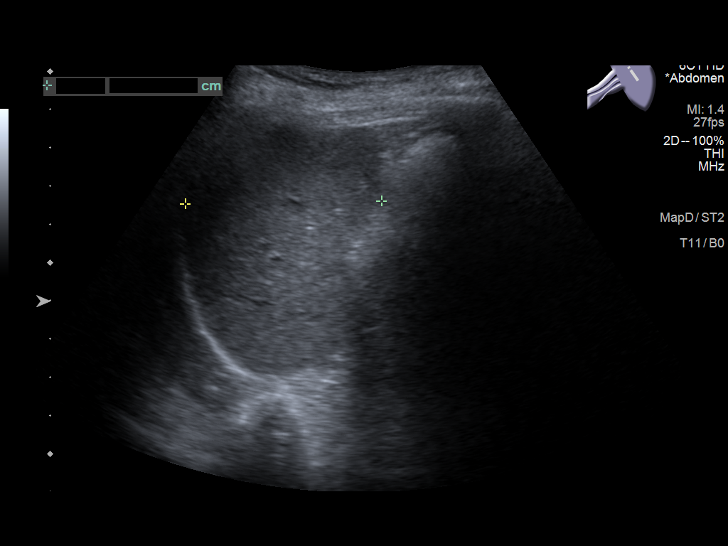
[im 40/74]
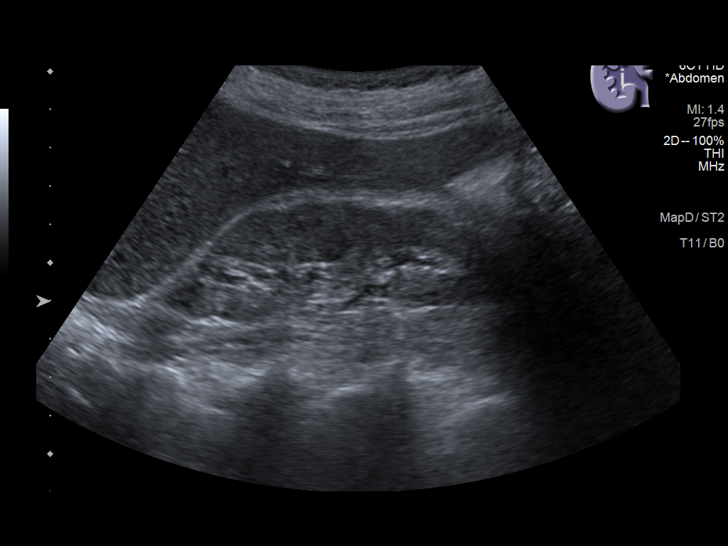
[im 46/74]
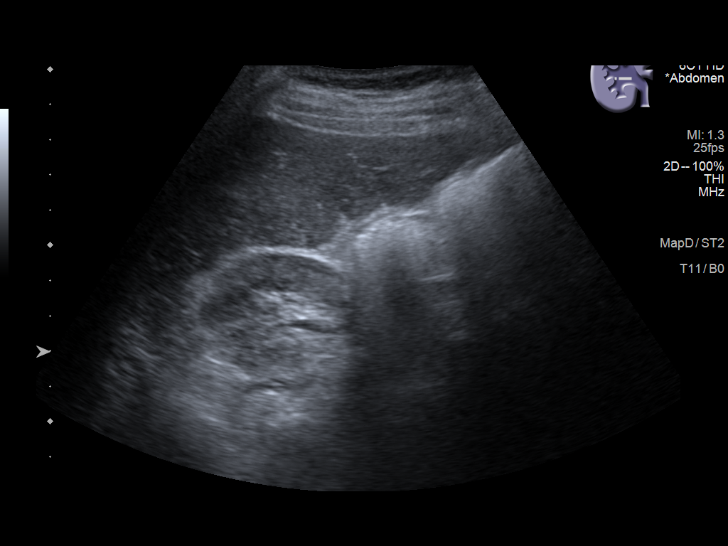
[im 49/74]
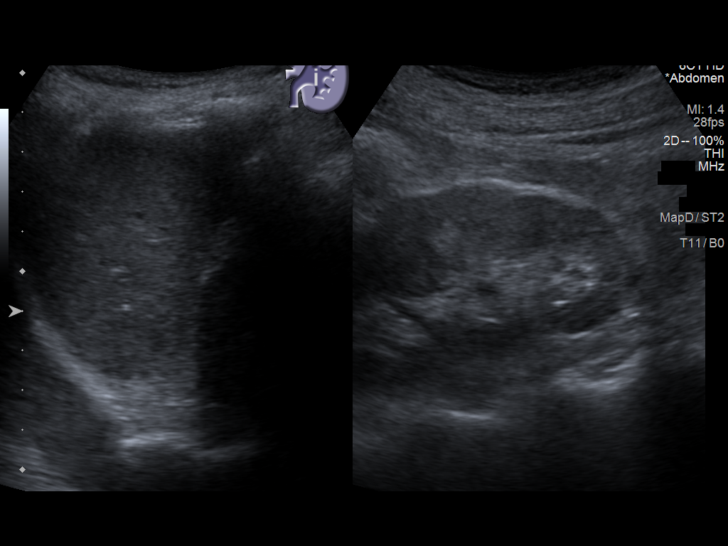
[im 55/74]
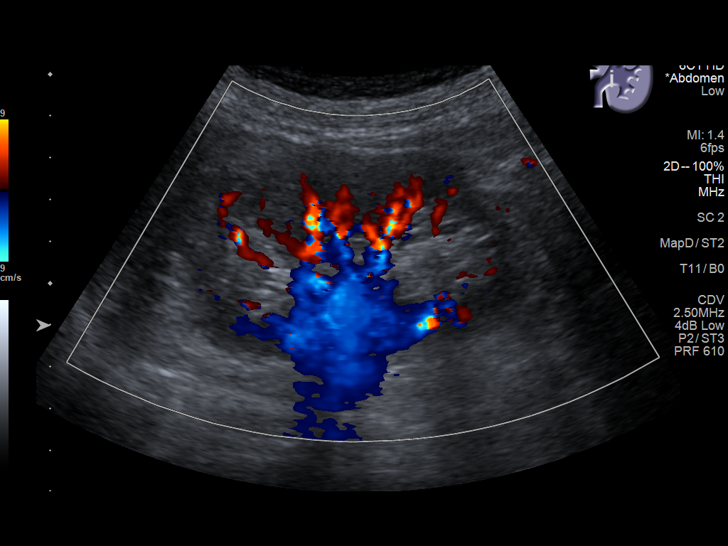
[im 61/74]
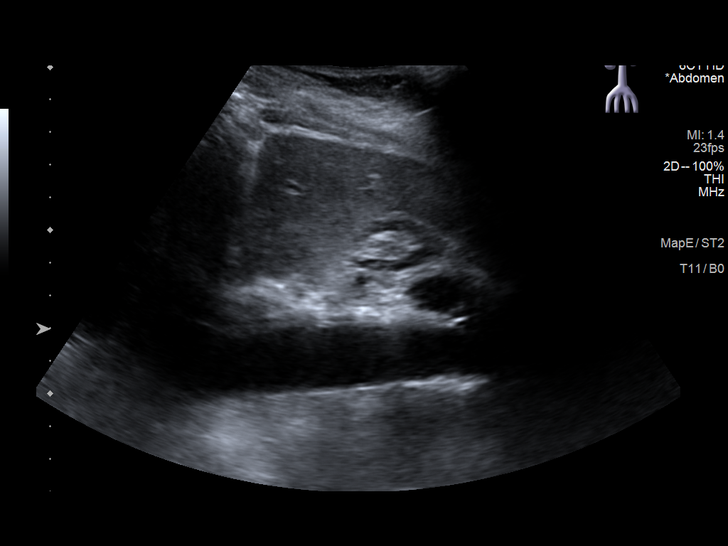
[im 67/74]
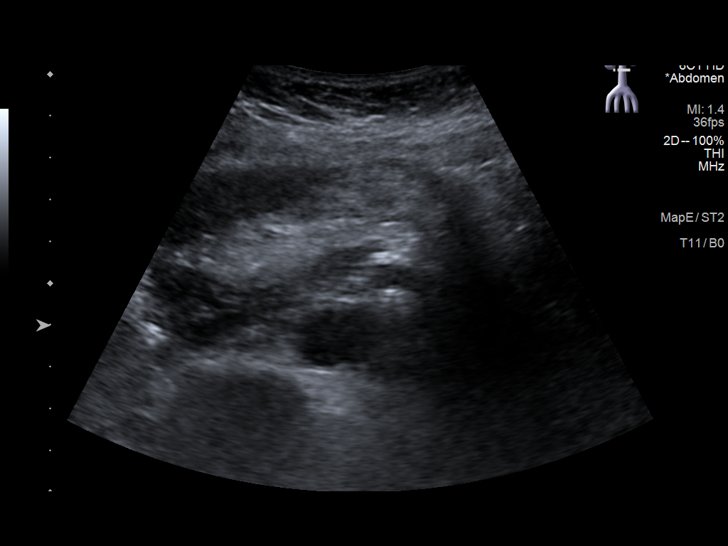
[im 74/74]
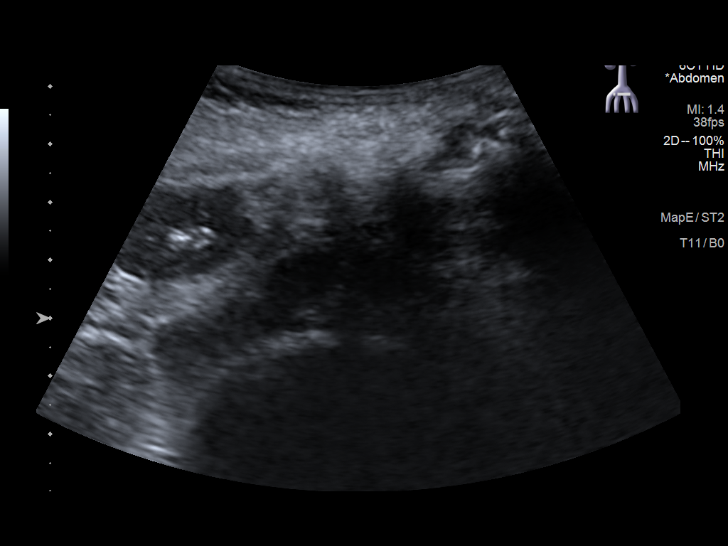

[14 of 25 positions shown; findings below may reference images not displayed]

FINDINGS: Gallbladder: Surgically absent.

Common bile duct: Diameter: 8 mm, within normal limits post
cholecystectomy.

Liver: No focal lesion identified. Within normal limits in
parenchymal echogenicity. Portal vein is patent on color Doppler
imaging with normal direction of blood flow towards the liver.

IVC: No abnormality visualized.

Pancreas: Visualized portion unremarkable.

Spleen: Size and appearance within normal limits.

Right Kidney: Length: 8.2 cm. Echogenicity within normal limits. No
mass or hydronephrosis visualized.

Left Kidney: Length: 8.5 cm. Echogenicity within normal limits. No
mass or hydronephrosis visualized.

Abdominal aorta: No aneurysm visualized.

Other findings: None.
IMPRESSION: Prior cholecystectomy. No evidence of biliary ductal dilatation or
other significant abnormality.

## 2022-08-30 NOTE — ED Triage Notes (Signed)
Pt reports no BM for 1 week.  Pt taking otc meds without relief. Hx bowel obstruction.  No n/v  pt alert  speech clear.

## 2022-08-30 NOTE — ED Notes (Signed)
No answer when called several times from lobby 

## 2022-08-31 ENCOUNTER — Emergency Department
Admission: EM | Admit: 2022-08-31 | Discharge: 2022-08-31 | Disposition: A | Discharge status: Home / Self Care | Payer: Medicare Other | Attending: Emergency Medicine | Admitting: Emergency Medicine

## 2022-08-31 ENCOUNTER — Emergency Department: Payer: Medicare Other

## 2022-08-31 ENCOUNTER — Encounter: Payer: Self-pay | Admitting: Emergency Medicine

## 2022-08-31 DIAGNOSIS — R1084 Generalized abdominal pain: Secondary | ICD-10-CM | POA: Diagnosis not present

## 2022-08-31 DIAGNOSIS — Z9049 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: Secondary | ICD-10-CM | POA: Diagnosis not present

## 2022-08-31 DIAGNOSIS — E119 Type 2 diabetes mellitus without complications: Secondary | ICD-10-CM | POA: Insufficient documentation

## 2022-08-31 DIAGNOSIS — G3183 Dementia with Lewy bodies: Secondary | ICD-10-CM | POA: Diagnosis not present

## 2022-08-31 DIAGNOSIS — R112 Nausea with vomiting, unspecified: Secondary | ICD-10-CM | POA: Diagnosis not present

## 2022-08-31 DIAGNOSIS — R109 Unspecified abdominal pain: Secondary | ICD-10-CM | POA: Diagnosis not present

## 2022-08-31 DIAGNOSIS — N189 Chronic kidney disease, unspecified: Secondary | ICD-10-CM | POA: Diagnosis not present

## 2022-08-31 DIAGNOSIS — I1310 Hypertensive heart and chronic kidney disease without heart failure, with stage 1 through stage 4 chronic kidney disease, or unspecified chronic kidney disease: Secondary | ICD-10-CM | POA: Diagnosis not present

## 2022-08-31 DIAGNOSIS — Z79899 Other long term (current) drug therapy: Secondary | ICD-10-CM | POA: Insufficient documentation

## 2022-08-31 DIAGNOSIS — K5900 Constipation, unspecified: Secondary | ICD-10-CM | POA: Diagnosis not present

## 2022-08-31 DIAGNOSIS — I10 Essential (primary) hypertension: Secondary | ICD-10-CM | POA: Diagnosis not present

## 2022-08-31 LAB — COMPREHENSIVE METABOLIC PANEL
ALT: 47 U/L — ABNORMAL HIGH (ref 0–44)
AST: 48 U/L — ABNORMAL HIGH (ref 15–41)
Albumin: 3 g/dL — ABNORMAL LOW (ref 3.5–5.0)
Alkaline Phosphatase: 149 U/L — ABNORMAL HIGH (ref 38–126)
Anion gap: 7 (ref 5–15)
BUN: 16 mg/dL (ref 8–23)
CO2: 26 mmol/L (ref 22–32)
Calcium: 8.8 mg/dL — ABNORMAL LOW (ref 8.9–10.3)
Chloride: 109 mmol/L (ref 98–111)
Creatinine, Ser: 1.58 mg/dL — ABNORMAL HIGH (ref 0.44–1.00)
GFR, Estimated: 35 mL/min — ABNORMAL LOW (ref >60)
Glucose, Bld: 106 mg/dL — ABNORMAL HIGH (ref 70–99)
Potassium: 3.6 mmol/L (ref 3.5–5.1)
Sodium: 142 mmol/L (ref 135–145)
Total Bilirubin: 0.4 mg/dL (ref 0.3–1.2)
Total Protein: 6.2 g/dL — ABNORMAL LOW (ref 6.5–8.1)

## 2022-08-31 LAB — URINALYSIS, ROUTINE W REFLEX MICROSCOPIC
Bilirubin Urine: NEGATIVE
Glucose, UA: NEGATIVE mg/dL
Hgb urine dipstick: NEGATIVE
Ketones, ur: NEGATIVE mg/dL
Leukocytes,Ua: NEGATIVE
Nitrite: NEGATIVE
Protein, ur: 30 mg/dL — AB
Specific Gravity, Urine: 1.016 (ref 1.005–1.030)
pH: 8 (ref 5.0–8.0)

## 2022-08-31 LAB — CBC WITH DIFFERENTIAL/PLATELET
Abs Immature Granulocytes: 0.03 10*3/uL (ref 0.00–0.07)
Basophils Absolute: 0 10*3/uL (ref 0.0–0.1)
Basophils Relative: 0 %
Eosinophils Absolute: 0.1 10*3/uL (ref 0.0–0.5)
Eosinophils Relative: 1 %
HCT: 35 % — ABNORMAL LOW (ref 36.0–46.0)
Hemoglobin: 10.7 g/dL — ABNORMAL LOW (ref 12.0–15.0)
Immature Granulocytes: 0 %
Lymphocytes Relative: 17 %
Lymphs Abs: 1.4 10*3/uL (ref 0.7–4.0)
MCH: 27 pg (ref 26.0–34.0)
MCHC: 30.6 g/dL (ref 30.0–36.0)
MCV: 88.2 fL (ref 80.0–100.0)
Monocytes Absolute: 0.7 10*3/uL (ref 0.1–1.0)
Monocytes Relative: 8 %
Neutro Abs: 6.1 10*3/uL (ref 1.7–7.7)
Neutrophils Relative %: 74 %
Platelets: 279 10*3/uL (ref 150–400)
RBC: 3.97 MIL/uL (ref 3.87–5.11)
RDW: 13.5 % (ref 11.5–15.5)
WBC: 8.3 10*3/uL (ref 4.0–10.5)
nRBC: 0 % (ref 0.0–0.2)

## 2022-08-31 LAB — LIPASE, BLOOD: Lipase: 27 U/L (ref 11–51)

## 2022-08-31 MED ORDER — MORPHINE SULFATE (PF) 4 MG/ML IV SOLN
4.0000 mg | Freq: Once | INTRAVENOUS | Status: AC
  Administered 2022-08-31: 4 mg via INTRAVENOUS
  Filled 2022-08-31: qty 1

## 2022-08-31 MED ORDER — ONDANSETRON HCL 4 MG/2ML IJ SOLN
4.0000 mg | Freq: Once | INTRAMUSCULAR | Status: AC
  Administered 2022-08-31: 4 mg via INTRAVENOUS
  Filled 2022-08-31: qty 2

## 2022-08-31 MED ORDER — SORBITOL 70 % SOLN
960.0000 mL | TOPICAL_OIL | Freq: Once | ORAL | Status: AC
  Administered 2022-08-31: 960 mL via RECTAL
  Filled 2022-08-31: qty 240

## 2022-08-31 MED ORDER — SODIUM CHLORIDE 0.9 % IV SOLN: INTRAVENOUS | Status: DC

## 2022-08-31 MED ORDER — POLYETHYLENE GLYCOL 3350 17 GM/SCOOP PO POWD
ORAL | 0 refills | Status: DC
Start: 2022-08-31 — End: 2023-06-19

## 2022-08-31 NOTE — ED Triage Notes (Signed)
Patient ambulatory to triage with steady gait, without difficulty or distress noted; pt left before being seen last night; returns for reports of possible obstructions; st no BM for approx wk, having generalized abd pain

## 2022-08-31 NOTE — ED Notes (Signed)
Patient updated on plan of care

## 2022-08-31 NOTE — Discharge Instructions (Addendum)
I recommend that you increase your water and fiber intake. If you are not able to eat foods high in fiber, you may use Benefiber or Metamucil over-the-counter. I also recommend you use MiraLAX as directed and Colace 100 mg twice a day to help with bowel movements. These medications are over the counter.  You may use other over-the-counter medications such as Dulcolax, Fleet enemas, magnesium citrate as needed for constipation. Please note that some of these medications may cause you to have abdominal cramping which is normal. If you develop severe abdominal pain, fever (temperature of 100.4 or higher), persistent vomiting, distention of your abdomen, unable to have a bowel movement for 5 days or are not passing gas, please return to the hospital.  Return at once to the emergency room for severe pain vomiting or fever

## 2022-08-31 NOTE — ED Provider Notes (Addendum)
[facility] Provider Note    Event Date/Time   First MD Initiated Contact with Patient 08/31/22 [phone]     (approximate)   History   Abdominal Pain   HPI  Julie Acevedo is a 71 y.o. female with history of hypertension, diabetes, [name], chronic kidney disease, multiple abdominal surgeries and bowel obstruction who presents to the emergency department with diffuse abdominal pain, nausea without vomiting.  Has not had a bowel movement or passed gas in a week.  Has tried over-the-counter medications without relief.  Patient has had previous gastric bypass, hysterectomy, cholecystectomy, appendectomy, ventral hernia repair.  She states she has also had to have previous surgery for a bowel obstruction before.   History provided by patient.    Past Medical History:  Diagnosis Date   Anemia    Arthritis    COVID-19 11/19/2019   Diverticulitis    DM (diabetes mellitus) ([place])    typ e 2   History of methicillin resistant staphylococcus aureus (MRSA) 2017   HTN (hypertension)    Hyperlipidemia    Memory difficulties 09/01/2015   Multiple thyroid nodules    Myocardial infarction ([place]) 1995   Short-term memory loss    Thyroid nodule     Past Surgical History:  Procedure Laterality Date   ABDOMINAL HYSTERECTOMY     due to endometriosis-1 ovary left   BREAST EXCISIONAL BIOPSY Right 1971   neg   CARDIAC CATHETERIZATION  2000   no significant CAD per note of Dr Caryl Comes in 2013    [place] N/A 03/19/2017   Procedure: ANTERIOR REPAIR (CYSTOCELE);  Surgeon: Bjorn Loser, MD;  Location: [facility];  Service: Urology;  Laterality: N/A;   CYSTOSCOPY N/A 03/19/2017   Procedure: CYSTOSCOPY;  Surgeon: Bjorn Loser, MD;  [facility]: [facility];  Service: Urology;  Laterality: N/A;   ESOPHAGOGASTRODUODENOSCOPY (EGD) WITH PROPOFOL N/A 12/15/2021   Procedure: ESOPHAGOGASTRODUODENOSCOPY (EGD) WITH PROPOFOL;  Surgeon: Lesly Rubenstein, MD;  Location: [facility];  Service: Endoscopy;  Laterality: N/A;   EYE SURGERY     FOOT SURGERY     HAND SURGERY     HEMICOLECTOMY     INSERTION OF MESH N/A 08/21/2016   Procedure: INSERTION OF MESH;  Surgeon: Excell Seltzer, MD;  [facility];  Service: General;  Laterality: N/A;   INSERTION OF MESH N/A 12/08/2019   Procedure: INSERTION OF MESH;  Surgeon: Jules Husbands, MD;  Location: [facility];  Service: General;  Laterality: N/A;   LAPAROSCOPIC LYSIS OF ADHESIONS N/A 08/21/2016   Procedure: LAPAROSCOPIC LYSIS OF ADHESIONS;  Surgeon: Excell Seltzer, MD;  [facility];  Service: General;  Laterality: N/A;   LAPAROSCOPIC REMOVAL ABDOMINAL MASS     LAPAROTOMY N/A 04/16/2019   Procedure: EXPLORATORY LAPAROTOMY;  Surgeon: Benjamine Sprague, DO;  Location: [facility];  Service: General;  Laterality: N/A;   LOOP RECORDER INSERTION N/A 03/21/2018   Procedure: LOOP RECORDER INSERTION;  Surgeon: Sanda Klein, MD;  Location: [place] [facility];  Service: Cardiovascular;  Laterality: N/A;   LUMBAR DISC SURGERY     ROUX-EN-Y GASTRIC BYPASS  2010   Dr Hassell Done   SMALL INTESTINE SURGERY     TONSILLECTOMY     UPPER GI ENDOSCOPY  01/12/2016   normal larynx, normal esophagus. gastric bypass with a normal-sized pouch and intact staple line, normal examined jejunum, otherwise exam was normal   VENTRAL HERNIA REPAIR N/A 08/21/2016   Procedure: LAPAROSCOPIC VENTRAL  HERNIA;  Surgeon: Excell Seltzer, MD;  [facility];  Service: General;  Laterality: N/A;   XI ROBOTIC ASSISTED VENTRAL HERNIA N/A 12/08/2019   Procedure: XI ROBOTIC ASSISTED VENTRAL HERNIA;  Surgeon: Jules Husbands, MD;  Location: [facility];  Service: General;  Laterality: N/A;    MEDICATIONS:  Prior to Admission medications   Medication Sig Start Date End Date Taking? Authorizing Provider  atorvastatin (LIPITOR) 40 MG tablet Take 1 tablet (40 mg total) by mouth daily. 06/25/22   Jerrol Banana., MD  blood  glucose meter kit and supplies Dispense Accu-Chek Guide. Use up to four times daily as directed. 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 08/16/22   Jerrol Banana., MD  calcitRIOL (ROCALTROL) 0.25 MCG capsule Take 0.25 mcg by mouth daily.    [provider]  copper tablet Take 1 tablet (2 mg total) by mouth daily. 08/14/22   Elgergawy, Silver Huguenin, MD  cyanocobalamin (VITAMIN B12) 500 MCG tablet Take 1 tablet (500 mcg total) by mouth daily. 08/14/22   Elgergawy, Silver Huguenin, MD  diazoxide (PROGLYCEM) 50 MG/ML suspension Take 1 mL (50 mg total) by mouth every 8 (eight) hours. 08/14/22   Elgergawy, Silver Huguenin, MD  gabapentin (NEURONTIN) 300 MG capsule Take 1 capsule (300 mg total) by mouth 3 (three) times daily. 07/02/22   Jerrol Banana., MD  Glucagon (GVOKE HYPOPEN 2-PACK) 1 MG/0.2ML SOAJ Inject 0.2 mLs into the skin as needed. 08/21/22   Jerrol Banana., MD  glucose blood test strip Use as instructed. OneTouch test strips. 08/15/22   Jerrol Banana., MD  hydrOXYzine (VISTARIL) 100 MG capsule Take 1 capsule (100 mg total) by mouth 3 (three) times daily. 07/02/22   Jerrol Banana., MD  linaclotide [facility]) 290 MCG CAPS capsule TAKE 1 [place] BREAKFAST Patient taking differently: Take 290 mcg by mouth daily before breakfast. 06/25/22   Jerrol Banana., MD  metoprolol succinate (TOPROL-XL) 25 MG 24 hr tablet Take 1 tablet (25 mg total) by mouth daily. 06/25/22   Jerrol Banana., MD  mirtazapine (REMERON) 30 MG tablet Take 1 tablet (30 mg total) by mouth at bedtime. 08/14/22   Elgergawy, Silver Huguenin, MD  omeprazole (PRILOSEC) 20 MG capsule Take 1 capsule (20 mg total) by mouth daily. 06/25/22   Jerrol Banana., MD  ondansetron (ZOFRAN) 4 MG tablet TAKE 1 TABLET BY MOUTH EVERY 8 HOURS AS NEEDED Patient taking differently: Take 4 mg by mouth every 8 (eight) hours as needed for nausea or vomiting. 07/30/22   Jerrol Banana., MD  traMADol  (ULTRAM) 50 MG tablet Take 1 tablet (50 mg total) by mouth every 6 (six) hours as needed for moderate pain or severe pain. 08/28/22   Jerrol Banana., MD  zinc gluconate 50 MG tablet Take 1 tablet (50 mg total) by mouth daily. 08/14/22   Elgergawy, Silver Huguenin, MD    Physical Exam   Triage Vital Signs: ED Triage Vitals  Enc Vitals Group     BP 08/31/22 0523 (!) 166/80     Pulse Rate 08/31/22 0523 78     Resp 08/31/22 0523 20     Temp 08/31/22 0523 97.7 F (36.5 C)     Temp src --      SpO2 08/31/22 0523 97 %     Weight 08/31/22 0524 123 lb (55.8 kg)     Height 08/31/22 0524 5'  3" (1.6 m)     Head Circumference --      Peak Flow --      Pain Score 08/31/22 0524 10     Pain Loc --      Pain Edu? --      Excl. in [place]? --     Most recent vital signs: Vitals:   08/31/22 0557 08/31/22 0630  BP: (!) 126/55 132/63  Pulse: 78 66  Resp: 20 18  Temp:    SpO2: 100% 98%    CONSTITUTIONAL: Alert and oriented and responds appropriately to questions.  Thin, appears uncomfortable HEAD: Normocephalic, atraumatic EYES: Conjunctivae clear, pupils appear equal, sclera nonicteric ENT: normal nose; moist mucous membranes NECK: Supple, normal ROM CARD: RRR; S1 and S2 appreciated; no murmurs, no clicks, no rubs, no gallops RESP: Normal chest excursion without splinting or tachypnea; breath sounds clear and equal bilaterally; no wheezes, no rhonchi, no rales, no hypoxia or respiratory distress, speaking full sentences ABD/GI: Abdomen is not distended but is diffusely tender to palpation without guarding or rebound BACK: The back appears normal EXT: Normal ROM in all joints; no deformity noted, no edema; no cyanosis SKIN: Normal color for age and race; warm; no rash on exposed skin NEURO: Moves all extremities equally, normal speech PSYCH: The patient's mood and manner are appropriate.   ED Results / Procedures / Treatments   LABS: (all labs ordered are listed, but only abnormal results  are displayed) Labs Reviewed  CBC WITH DIFFERENTIAL/PLATELET - Abnormal; Notable for the following components:      Result Value   Hemoglobin 10.7 (*)    HCT 35.0 (*)    All other components within normal limits  COMPREHENSIVE METABOLIC PANEL - Abnormal; Notable for the following components:   Glucose, Bld 106 (*)    Creatinine, Ser 1.58 (*)    Calcium 8.8 (*)    Total Protein 6.2 (*)    Albumin 3.0 (*)    AST 48 (*)    ALT 47 (*)    Alkaline Phosphatase 149 (*)    GFR, Estimated 35 (*)    All other components within normal limits  LIPASE, BLOOD  URINALYSIS, ROUTINE W REFLEX MICROSCOPIC     EKG:   RADIOLOGY: My personal review and interpretation of imaging: CT abdomen pelvis shows constipation.  No bowel obstruction.  I have personally reviewed all radiology reports.   CT ABDOMEN PELVIS WO CONTRAST  Result Date: 08/31/2022 CLINICAL DATA:  Generalized abdominal pain with constipation. EXAM: CT ABDOMEN AND PELVIS WITHOUT CONTRAST TECHNIQUE: Multidetector CT imaging of the abdomen and pelvis was performed following the standard protocol without IV contrast. RADIATION DOSE REDUCTION: This exam was performed according to the departmental dose-optimization program which includes automated exposure control, adjustment of the mA and/or kV according to patient size and/or use of iterative reconstruction technique. COMPARISON:  08/11/2022 FINDINGS: Lower chest: Unremarkable. Hepatobiliary: No suspicious focal abnormality in the liver on this study without intravenous contrast. Gallbladder is surgically absent. No intrahepatic or extrahepatic biliary dilation. Pancreas: No focal mass lesion. No dilatation of the main duct. No intraparenchymal cyst. No peripancreatic edema. Spleen: No splenomegaly. No focal mass lesion. Adrenals/Urinary Tract: No adrenal nodule or mass. Kidneys unremarkable. No evidence for hydroureter. Bladder is distended. Stomach/Bowel: Surgical changes in the stomach  consistent with gastric bypass. No small bowel wall thickening. No small bowel dilatation. Neither the terminal ileum nor the appendix are discretely visualized. Moderate stool volume evident throughout the colon with large stool volume  noted in the rectum. Diverticular changes are noted in the left colon without evidence of diverticulitis. Vascular/Lymphatic: No abdominal aortic aneurysm. There is no gastrohepatic or hepatoduodenal ligament lymphadenopathy. No retroperitoneal or mesenteric lymphadenopathy. No pelvic sidewall lymphadenopathy. Reproductive: The uterus is surgically absent. There is no adnexal mass. Other: No intraperitoneal free fluid. Musculoskeletal: Asymmetry of the rectus sheath identified previously is less prominent today. Lumbar fusion hardware evident. Diffuse sclerosis may be related to renal disease. IMPRESSION: 1. No acute findings in the abdomen or pelvis. 2. Moderate stool volume throughout the colon with large stool volume in the rectum. Imaging features could be compatible with clinical constipation/impaction. 3. Asymmetry of the rectus sheath identified previously is less prominent today. Electronically Signed   By: Eric  Mansell M.D.   On: 08/31/2022 06:34   DG Abdomen 1 View  Result Date: 08/30/2022 CLINICAL DATA:  Constipation EXAM: ABDOMEN - 1 VIEW COMPARISON:  12/15/2020 FINDINGS: Dilated small bowel loops in the left abdomen concerning for small bowel obstruction. No organomegaly or free air. Prior cholecystectomy. Visualized lung bases clear. IMPRESSION: Dilated left abdominal small bowel loops concerning for small bowel obstruction. Electronically Signed   By: Kevin  Dover M.D.   On: 08/30/2022 21:12     PROCEDURES:  Critical Care performed: No      .1-3 Lead EKG Interpretation  Performed by: ,  N, DO Authorized by: ,  N, DO     Interpretation: normal     ECG rate:  78   ECG rate assessment: normal     Rhythm: sinus rhythm      Ectopy: none     Conduction: normal       IMPRESSION / MDM / ASSESSMENT AND PLAN / ED COURSE  I reviewed the triage vital signs and the nursing notes.    Patient here for abdominal pain with history of bowel obstructions.  Was here earlier today and had an x-ray of her abdomen that was concerning for dilated left abdominal small bowel loops concerning for bowel obstruction.  She left without being seen.  The patient is on the cardiac monitor to evaluate for evidence of arrhythmia and/or significant heart rate changes.   DIFFERENTIAL DIAGNOSIS (includes but not limited to):   Small bowel obstruction, colitis, diverticulitis, perforation, constipation   Patient's presentation is most consistent with acute presentation with potential threat to life or bodily function.   PLAN: We will obtain CBC, CMP, lipase, urinalysis, CT of the abdomen pelvis.  Due to chronic kidney disease and allergy to contrast, will proceed with CT scan without contrast.  Patient not able to tolerate oral contrast at this time as she is extremely symptomatic.  Will give IV fluids, pain and nausea medicine.   MEDICATIONS GIVEN IN ED: Medications  0.9 %  sodium chloride infusion ( Intravenous New Bag/Given 08/31/22 0551)  sorbitol, milk of mag, mineral oil, glycerin (SMOG) enema (has no administration in time range)  ondansetron (ZOFRAN) injection 4 mg (4 mg Intravenous Given 08/31/22 0551)  morphine (PF) 4 MG/ML injection 4 mg (4 mg Intravenous Given 08/31/22 0551)     ED COURSE: 6:41 AM  CT scan reviewed and interpreted by myself and the radiologist and shows constipation but no bowel obstruction.  Will give smog enema.  Labs and urine pending.  Patient appears much more comfortable after pain medication.   Signed out to oncoming ED physician at 7 AM.   7:14 AM  Pt's labs show no leukocytosis.  Improving anemia compared to previous.    Chronically elevated liver function test which appear near her baseline.   Stable chronic kidney disease.  Lipase normal.  Urinalysis pending.  Patient will need to be reassessed after enema.   CONSULTS: Dispo pending further work-up.   OUTSIDE RECORDS REVIEWED: Reviewed patient's last family medicine note on 08/21/2022.       FINAL CLINICAL IMPRESSION(S) / ED DIAGNOSES   Final diagnoses:  Constipation, unspecified constipation type     Rx / DC Orders   ED Discharge Orders     None        Note:  This document was prepared using Dragon voice recognition software and may include unintentional dictation errors.   Theoplis Garciagarcia, Delice Bison, DO 08/31/22 [place], Delice Bison, DO 08/31/22 [phone]

## 2022-09-02 DIAGNOSIS — E1165 Type 2 diabetes mellitus with hyperglycemia: Secondary | ICD-10-CM | POA: Diagnosis not present

## 2022-09-05 ENCOUNTER — Other Ambulatory Visit: Payer: Self-pay | Admitting: Family Medicine

## 2022-09-05 DIAGNOSIS — I10 Essential (primary) hypertension: Secondary | ICD-10-CM

## 2022-09-06 IMAGING — CR DG KNEE COMPLETE 4+V*L*
1 series · 4 of 4 positions shown · non-contrast
Comparison: None.

CLINICAL DATA: Bilateral knee pain

EXAM:
LEFT KNEE - COMPLETE 4+ VIEW

[Series 1: dg knee complete 4 views left · 0.14mm/px · 4 of 4 slices shown]
[im 1/4]
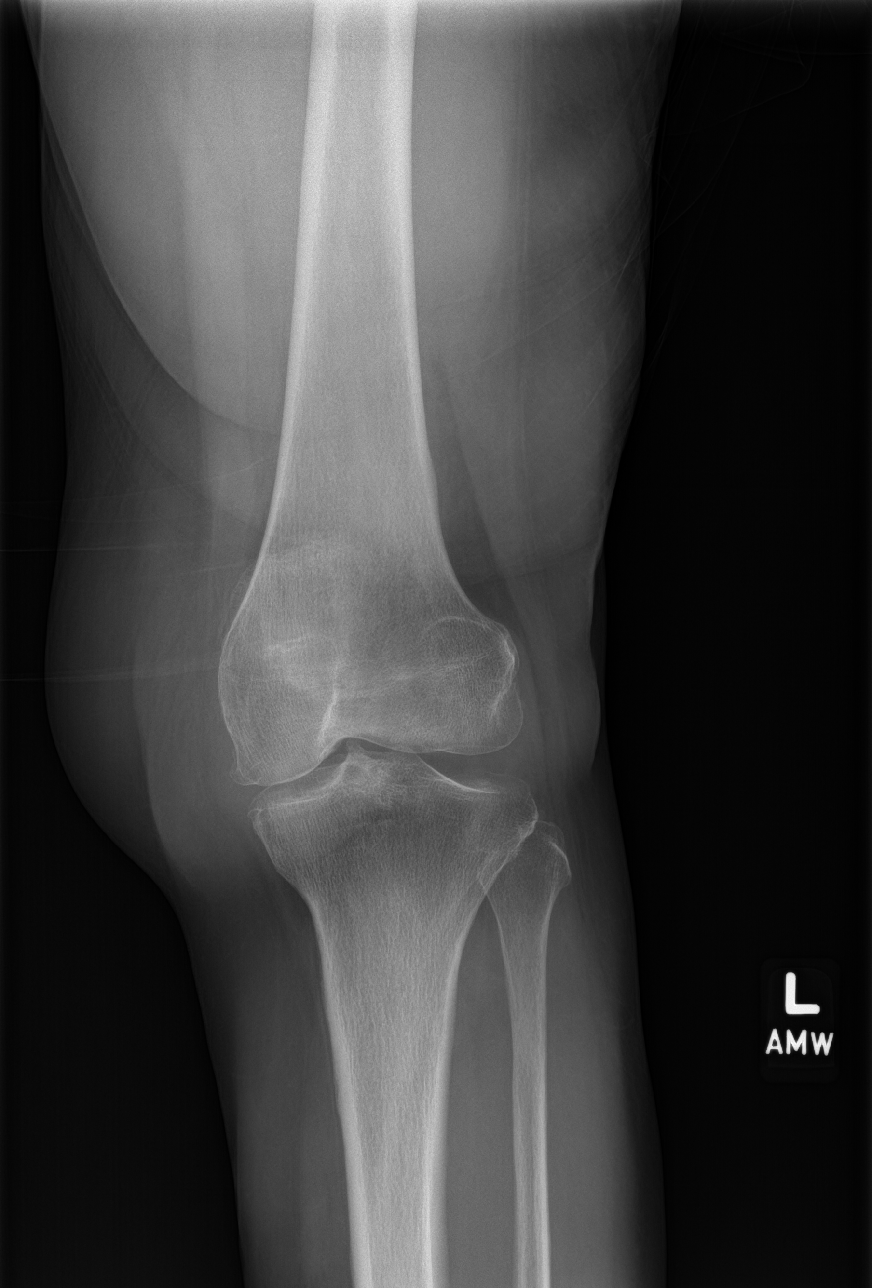
[im 2/4]
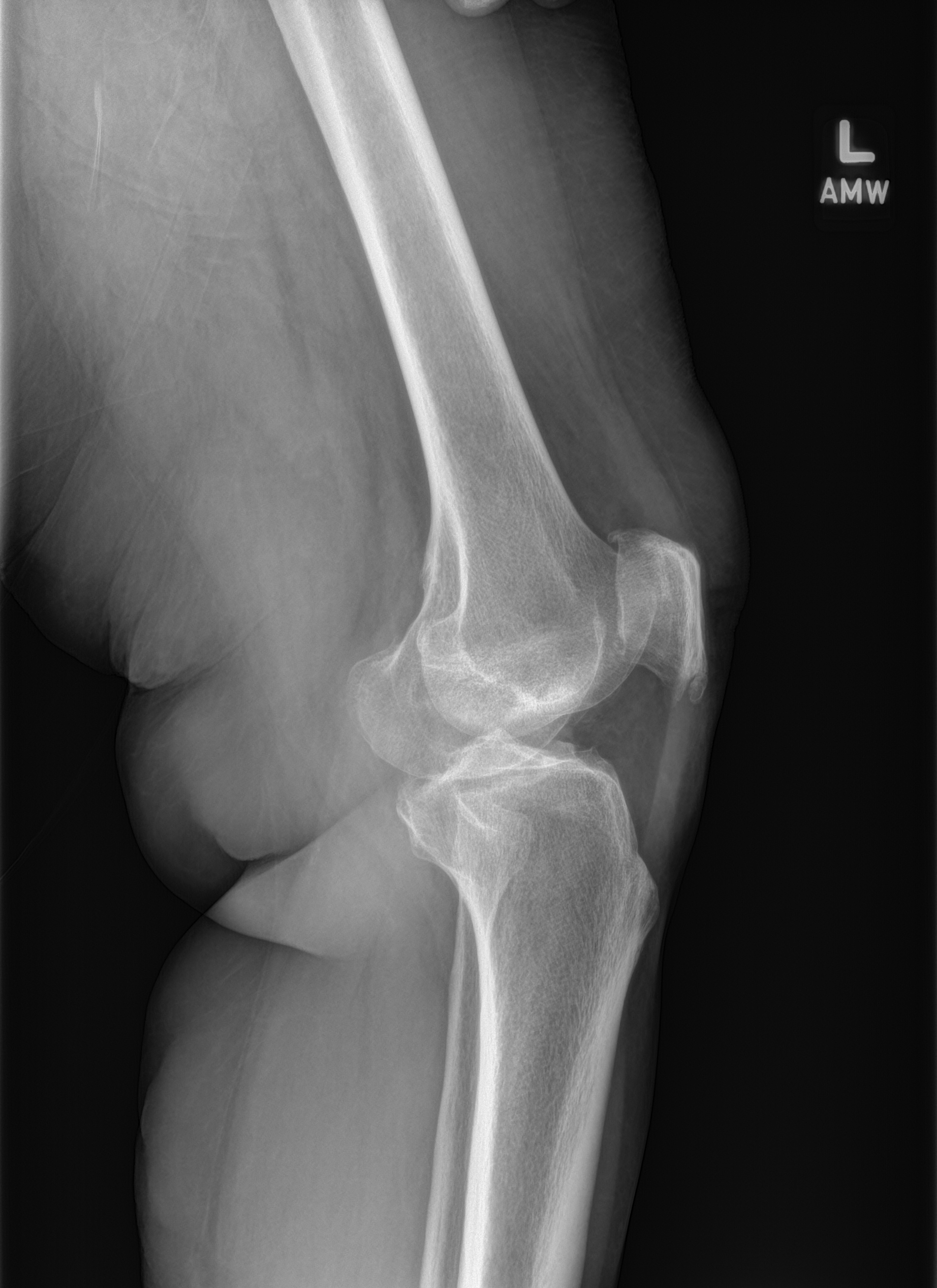
[im 3/4]
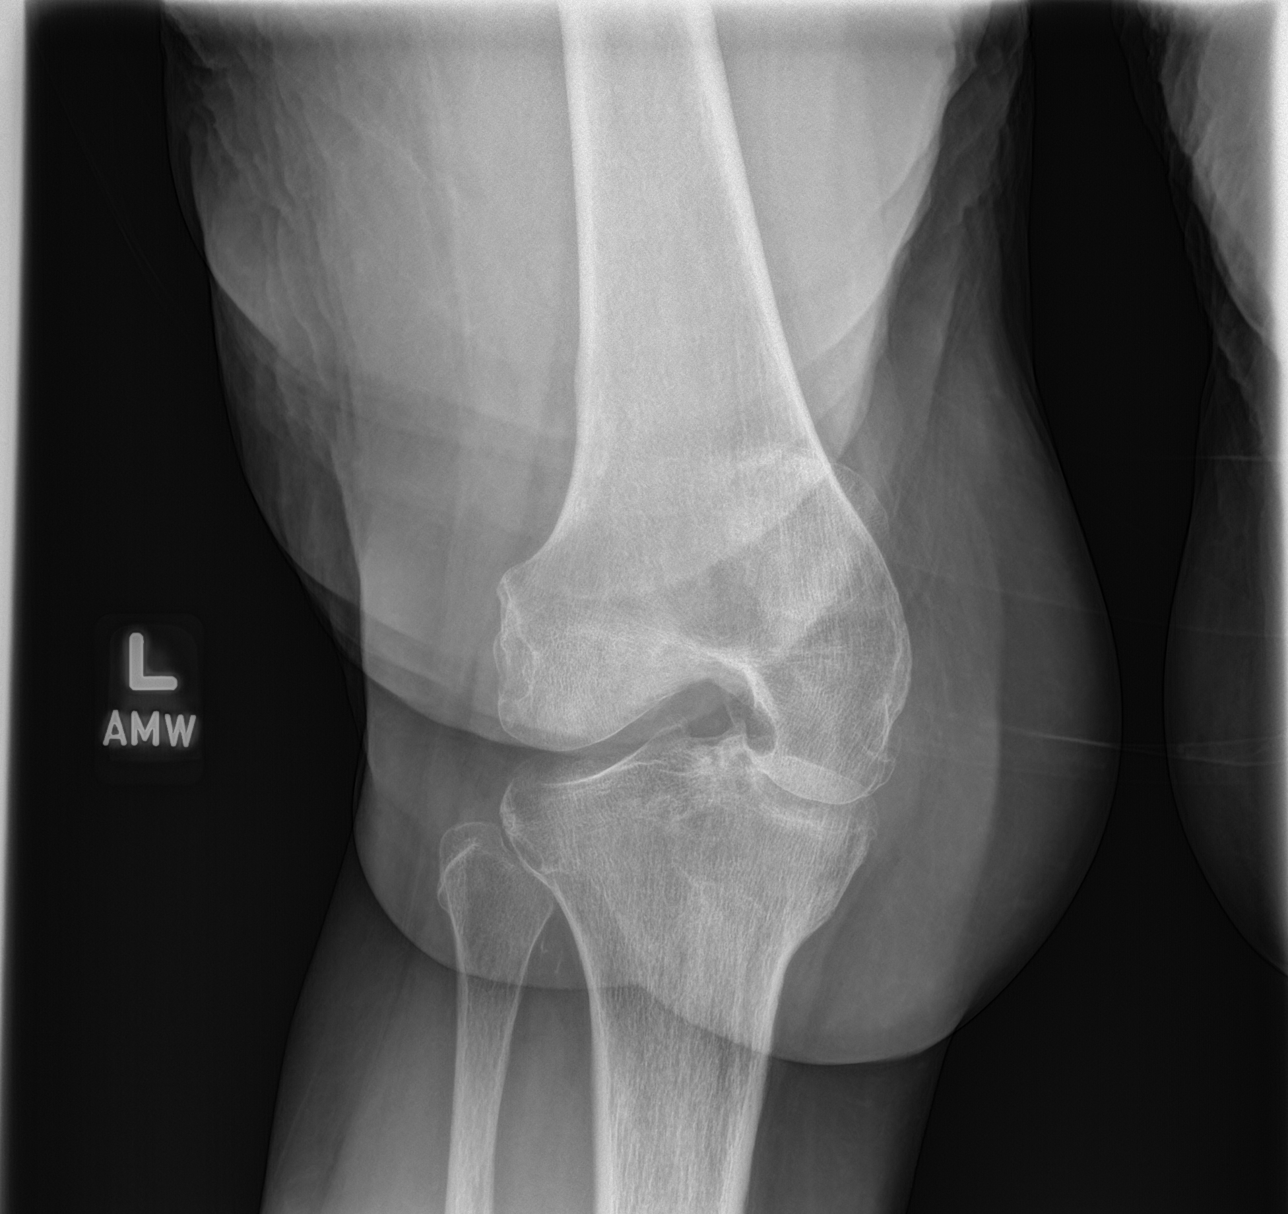
[im 4/4]
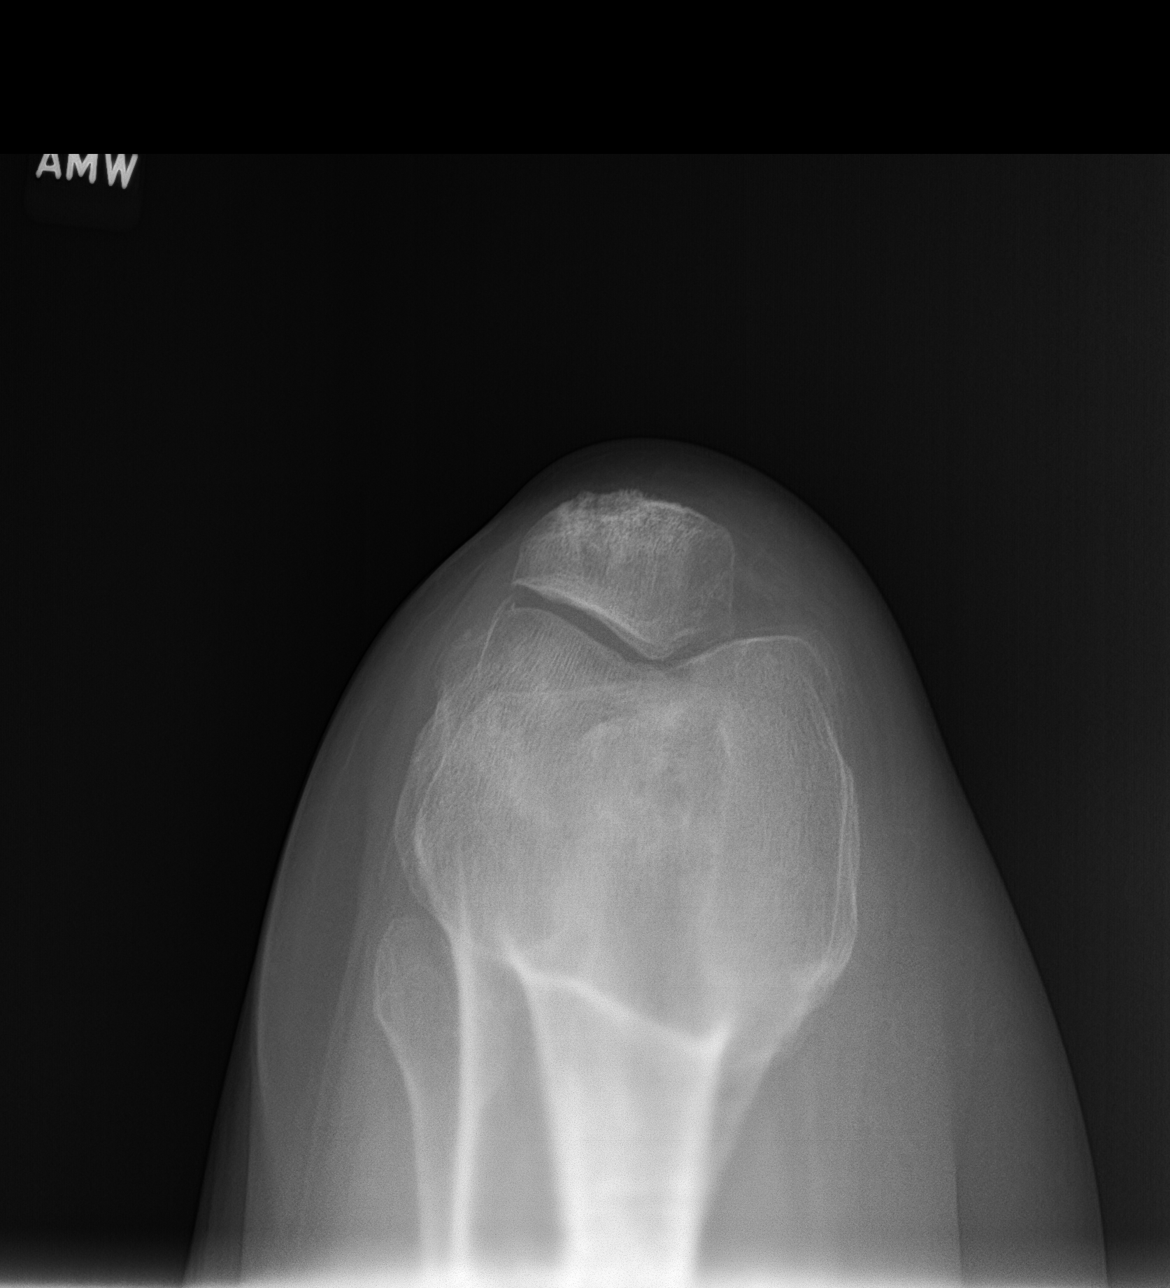

[4 of 4 positions shown; findings below may reference images not displayed]

FINDINGS: No fracture or malalignment. Mild medial and patellofemoral joint
space degenerative changes. No significant knee effusion.
IMPRESSION: Mild degenerative changes.

## 2022-09-06 IMAGING — CR DG KNEE COMPLETE 4+V*R*
1 series · 4 of 4 positions shown · non-contrast
Comparison: None.

CLINICAL DATA: Bilateral knee pain

EXAM:
RIGHT KNEE - COMPLETE 4+ VIEW

[Series 1: dg knee complete 4 views right · 0.14mm/px · 4 of 4 slices shown]
[im 1/4]
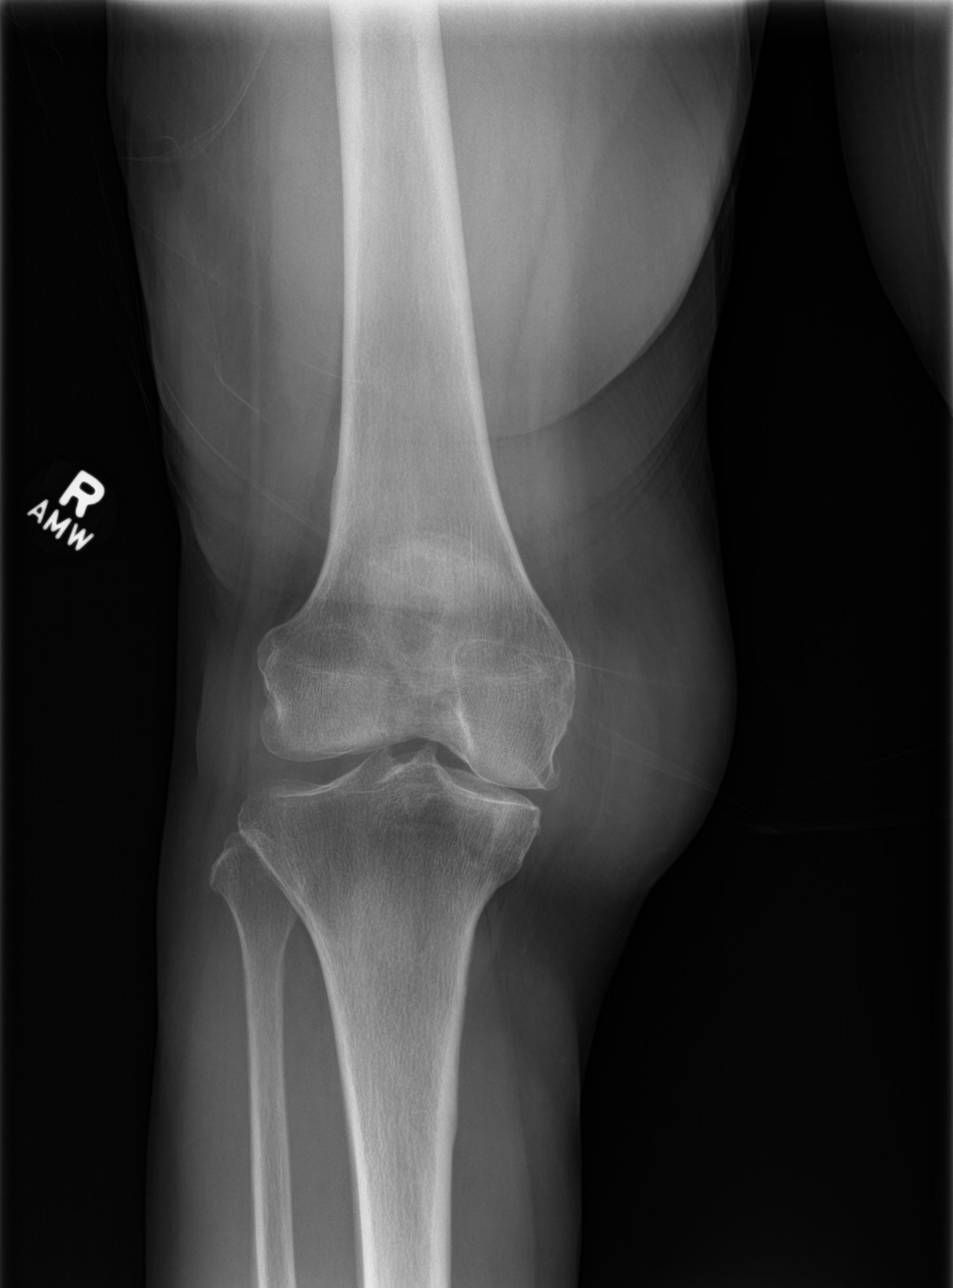
[im 2/4]
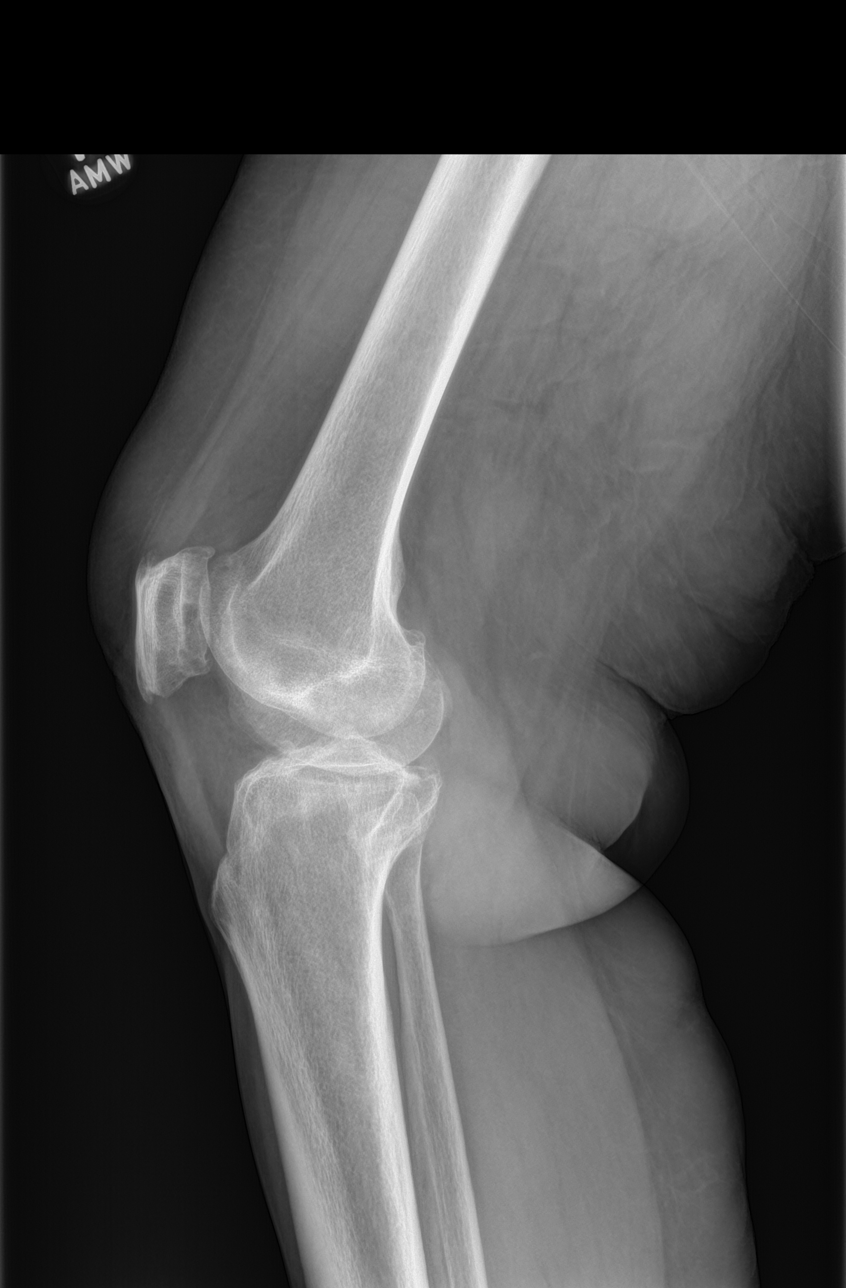
[im 3/4]
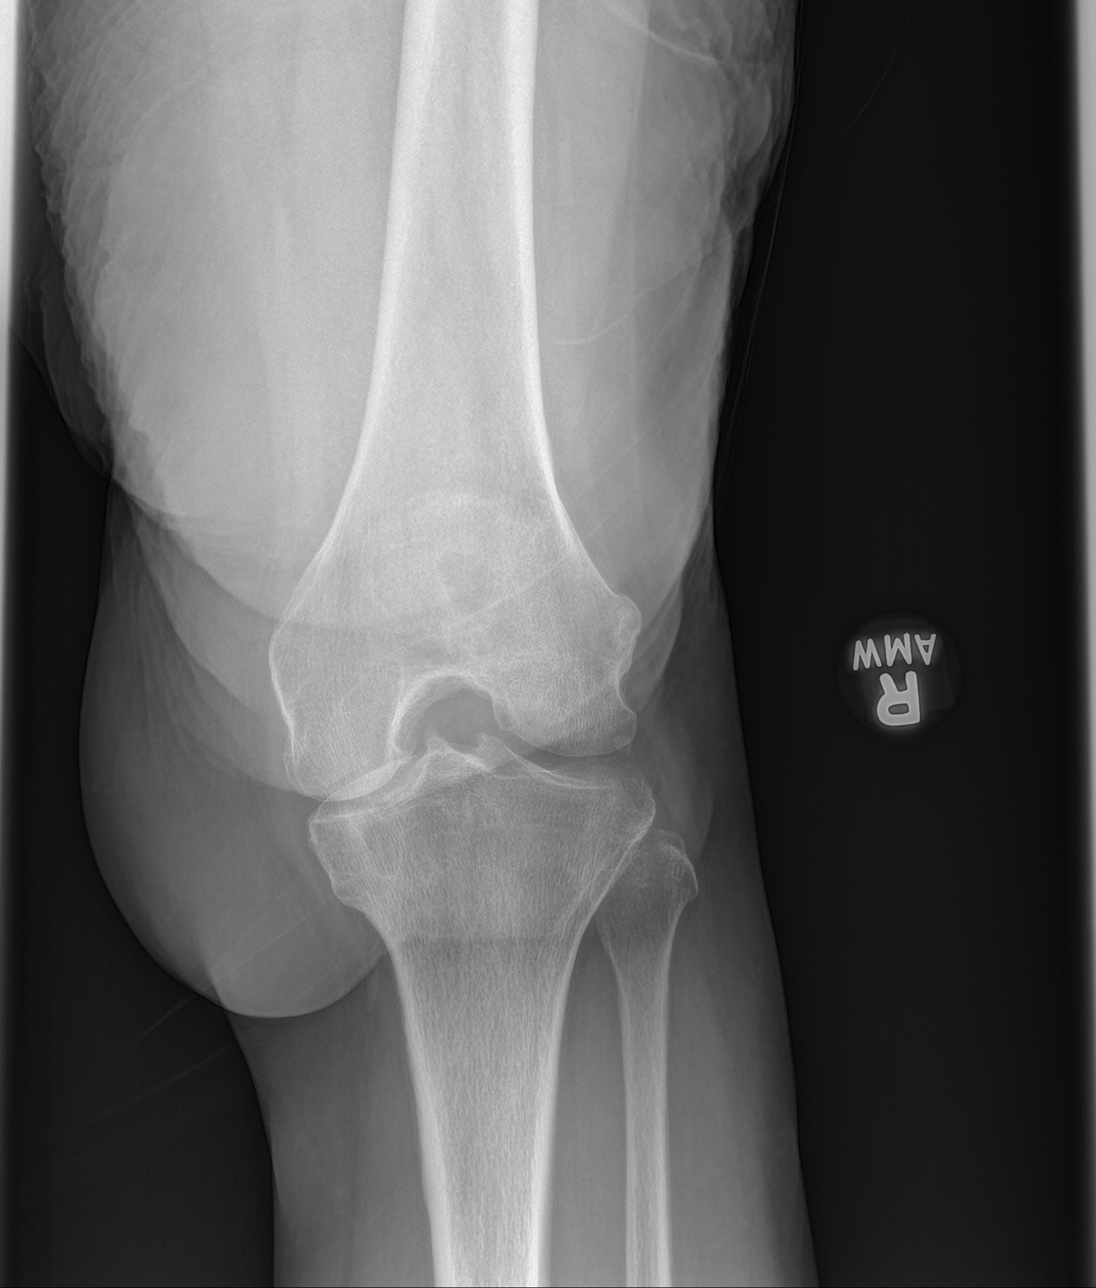
[im 4/4]
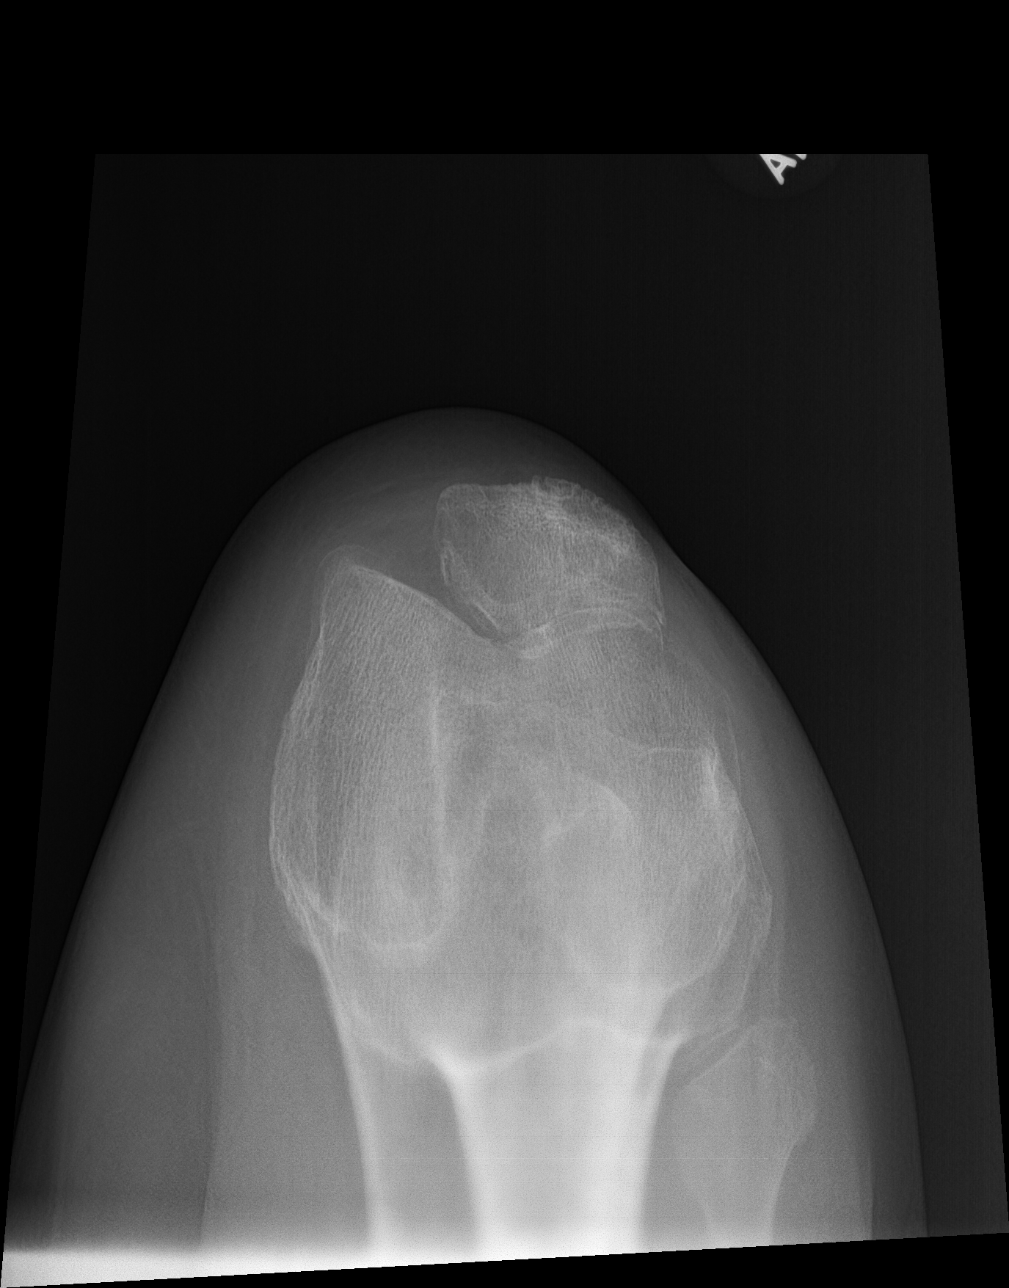

[4 of 4 positions shown; findings below may reference images not displayed]

FINDINGS: No fracture or malalignment. Mild to moderate medial and
patellofemoral joint space degenerative change. No significant knee
effusion
IMPRESSION: Mild to moderate arthritis.  No acute osseous abnormality

## 2022-09-10 ENCOUNTER — Telehealth: Payer: Self-pay

## 2022-09-10 NOTE — Progress Notes (Signed)
Chronic Care Management Pharmacy Assistant   Name: Julie Acevedo  MRN: [id number] DOB: 1951-04-28  Reason for Encounter: Medication Review/Medication Coordination for [facility]   Recent office visits:  08/21/2022 Miguel Aschoff, MD (PCP Office Visit- for Hospital Follow-up- Started: Glucagon 1 mg inject prn, Referral to Endocrinology placed, No follow-up noted  Recent consult visits:  None ID  Hospital visits:  Medication Reconciliation was completed by comparing discharge summary, patient's EMR and Pharmacy list, and upon discussion with patient.  Admitted to the hospital on 08/31/2022 due to Abdominal Pain. Discharge date was 08/31/2022. Discharged from [facility] Emergency Department.    New?Medications Started at [facility] Discharge:?? -Started  Polyethylene Glycol 3350 17 GM/SCOOP Take 1 scoop or 17 g 3 times a day for 2 days then use 1 scoop twice a day a day until you begin having soft bowel movements then cut back to 1 scoop a day.  Medication Changes at Hospital Discharge: -Changed None ID  Medications Discontinued at Hospital Discharge: -Stopped None ID  Medications that remain the same after Hospital Discharge:??  -All other medications will remain the same.      Medication Reconciliation was completed by comparing discharge summary, patient's EMR and Pharmacy list, and upon discussion with patient.  Admitted to the hospital on 08/21/2022 due to Hypoglycemia. Discharge date was 08/21/2022. Discharged from [facility] Emergency Department.    New?Medications Started at [facility] Discharge:?? -Started None ID  Medication Changes at Hospital Discharge: -Changed None ID  Medications Discontinued at Hospital Discharge: -Stopped None ID  Medications that remain the same after Hospital Discharge:??  -All other medications will remain the same.    Medications: Outpatient Encounter Medications as of 09/10/2022   Medication Sig   atorvastatin (LIPITOR) 40 MG tablet Take 1 tablet (40 mg total) by mouth daily.   blood glucose meter kit and supplies Dispense Accu-Chek Guide. Use up to four times daily as directed. 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   calcitRIOL (ROCALTROL) 0.25 MCG capsule Take 0.25 mcg by mouth daily.   copper tablet Take 1 tablet (2 mg total) by mouth daily.   cyanocobalamin (VITAMIN B12) 500 MCG tablet Take 1 tablet (500 mcg total) by mouth daily.   diazoxide (PROGLYCEM) 50 MG/ML suspension Take 1 mL (50 mg total) by mouth every 8 (eight) hours.   gabapentin (NEURONTIN) 300 MG capsule Take 1 capsule (300 mg total) by mouth 3 (three) times daily.   Glucagon (GVOKE HYPOPEN 2-PACK) 1 MG/0.2ML SOAJ Inject 0.2 mLs into the skin as needed.   glucose blood test strip Use as instructed. OneTouch test strips.   hydrOXYzine (VISTARIL) 100 MG capsule Take 1 capsule (100 mg total) by mouth 3 (three) times daily.   linaclotide (LINZESS) 290 MCG CAPS capsule TAKE 1 CAPSULE BY MOUTH EVERY DAY BEFORE BREAKFAST (Patient taking differently: Take 290 mcg by mouth daily before breakfast.)   metoprolol succinate (TOPROL-XL) 25 MG 24 hr tablet TAKE 1 TABLET (25 MG TOTAL) BY MOUTH DAILY.   mirtazapine (REMERON) 30 MG tablet Take 1 tablet (30 mg total) by mouth at bedtime.   omeprazole (PRILOSEC) 20 MG capsule Take 1 capsule (20 mg total) by mouth daily.   ondansetron (ZOFRAN) 4 MG tablet TAKE 1 TABLET BY MOUTH EVERY 8 HOURS AS NEEDED (Patient taking differently: Take 4 mg by mouth every 8 (eight) hours as needed for nausea or vomiting.)   polyethylene glycol powder (MIRALAX) 17 GM/SCOOP powder Take 1 scoop or 17 g  3 times a day for 2 days then use 1 scoop twice a day a day until you begin having soft bowel movements then cut back to 1 scoop a day.   traMADol (ULTRAM) 50 MG tablet Take 1 tablet (50 mg total) by mouth every 6 (six) hours as needed for moderate pain or severe pain.   zinc gluconate 50 MG tablet Take  1 tablet (50 mg total) by mouth daily.   No facility-administered encounter medications on file as of 09/10/2022.   Care Gaps: Diabetic Eye Exam Diabetic Foot Exam Influenza Vaccine  Star Rating Drugs: Atorvastatin 40 mg last filled on 06/27/2022 for a 87-Day supply with [facility]  BP Readings from Last 3 Encounters:  08/31/22 133/67  08/30/22 (!) 166/76  08/21/22 120/84    Lab Results  Component Value Date   HGBA1C 5.4 08/09/2022   Patient obtains medications through Vials  30 Days   Last adherence delivery included:  This is the patient's first coordinated delivery  Patient declined medications last month: N/A  Patient is due for next adherence delivery on: 09/20/2022 [address].  Called patient and reviewed medications and coordinated delivery.  This delivery to include: Atorvastatin 40 mg 1 tablet daily- (Breakfast) Calcitriol 0.25 mcg 1 tablet daily- (Breakfast) Gabapentin 300 mg 1 capsule three times daily-(Breakfast, Lunch, Bedtime) Hydroxyzine 100 mg 1 capsule three times daily-(Breakfast, Lunch, Bedtime) Linzess 290 mcg caps 1 cap daily (Before Breakfast) Metoprolol 25 mg XL 1 tablet daily -(Breakfast) Mirtazapine 15 mg 1 tablet daily -(Bedtime) Omeprazole 20 mg  1 capsule by mouth daily -(Breakfast) Rivastigmine 3 mg twice daily- (Breakfast, Bedtime)  Patient declined the following medications for the month of October: No medications were declined for this month  Patient needs refills for: No refills needed for this month's delivery  Confirmed delivery date of 09/20/2022 [address], advised patient that pharmacy will contact them the morning of delivery.   I spoke to the patient, and she reports that she is feeling much better. Per patient she did get the OTC Magnesium and she reports that it resolved her constipation issues. Per patient she feels much better and is no longer constipated. Patient has no other issues or concerns today.  Patient has  a telephone appointment with CPP on 10/19/2022 @ 1000.  Lynann Bologna, CPA/CMA Clinical Pharmacist Assistant Phone: [phone]

## 2022-09-12 ENCOUNTER — Other Ambulatory Visit: Payer: Self-pay | Admitting: Family Medicine

## 2022-09-12 DIAGNOSIS — E782 Mixed hyperlipidemia: Secondary | ICD-10-CM

## 2022-09-12 NOTE — Telephone Encounter (Signed)
Requested Prescriptions  Pending Prescriptions Disp Refills  . atorvastatin (LIPITOR) 40 MG tablet [Pharmacy Med Name: atorvastatin 40 mg tablet] 90 tablet 0    Sig: TAKE ONE TABLET BY MOUTH EVERY MORNING     Cardiovascular:  Antilipid - Statins Failed - 09/12/2022  8:04 AM      Failed - Lipid Panel in normal range within the last 12 months    Cholesterol, Total  Date Value Ref Range Status  12/05/2020 163 100 - 199 mg/dL Final   Cholesterol  Date Value Ref Range Status  09/06/2021 134 0 - 200 mg/dL Final  01/18/2014 154 0 - 200 mg/dL Final   Ldl Cholesterol, Calc  Date Value Ref Range Status  01/18/2014 69 0 - 100 mg/dL Final   LDL Chol Calc (NIH)  Date Value Ref Range Status  12/05/2020 86 0 - 99 mg/dL Final   LDL Cholesterol  Date Value Ref Range Status  09/06/2021 63 0 - 99 mg/dL Final    Comment:           Total Cholesterol/HDL:CHD Risk Coronary Heart Disease Risk Table                     Men   Women  1/2 Average Risk   3.4   3.3  Average Risk       5.0   4.4  2 X Average Risk   9.6   7.1  3 X Average Risk  23.4   11.0        Use the calculated Patient Ratio above and the CHD Risk Table to determine the patient's CHD Risk.        ATP III CLASSIFICATION (LDL):  <100     mg/dL   Optimal  100-129  mg/dL   Near or Above                    Optimal  130-159  mg/dL   Borderline  160-189  mg/dL   High  >190     mg/dL   Very High Performed at [facility], [place], [place] [address]    HDL Cholesterol  Date Value Ref Range Status  01/18/2014 74 (H) 40 - 60 mg/dL Final   HDL  Date Value Ref Range Status  09/06/2021 60 >40 mg/dL Final  12/05/2020 61 >39 mg/dL Final   Triglycerides  Date Value Ref Range Status  09/06/2021 54 <150 mg/dL Final  01/18/2014 53 0 - 200 mg/dL Final         Passed - Patient is not pregnant      Passed - Valid encounter within last 12 months    Recent Outpatient Visits          3 weeks ago  Hypoglycemia   [facility] Jerrol Banana., MD   3 months ago 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, psychotic disturbance, mood disturbance, or anxiety [facility])   [facility] Jerrol Banana., MD   4 months ago Neck mass   [facility] Jerrol Banana., MD   7 months ago 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

## 2022-09-19 ENCOUNTER — Other Ambulatory Visit: Payer: Self-pay | Admitting: Dermatology

## 2022-09-19 DIAGNOSIS — L723 Sebaceous cyst: Secondary | ICD-10-CM

## 2022-09-20 ENCOUNTER — Ambulatory Visit: Payer: Self-pay | Admitting: *Deleted

## 2022-09-20 NOTE — Patient Outreach (Signed)
  Care Coordination   Initial Visit Note   09/20/2022 Name: CECILIA VANCLEVE MRN: [id number] DOB: [birth year]  MELODI HAPPEL is a 71 y.o. year old female who sees Jerrol Banana., MD for primary care. I spoke with  [name] by phone today.  What matters to the patients health and wellness today?  Diabetes Mananagement    Goals Addressed             This Visit's Progress    case management activities       Care Coordination Interventions: Case Management program offered and discussed SDOH screening completed Confirmed that patient would like assistance with Diabetes Management-reports recent discharge from the hospital due to low blood sugar Initial appointment with RNCM scheduled for 09/25/22 at 10:30am         SDOH assessments and interventions completed:  Yes  SDOH Interventions Today    Flowsheet Row Most Recent Value  SDOH Interventions   Food Insecurity Interventions Intervention Not Indicated  Housing Interventions Intervention Not Indicated  Transportation Interventions Intervention Not Indicated  Utilities Interventions Intervention Not Indicated        Care Coordination Interventions Activated:  Yes  Care Coordination Interventions:  Yes, provided   Follow up plan: Follow up call scheduled for 09/25/22 at 10:30am    Encounter Outcome:  Pt. Scheduled

## 2022-09-20 NOTE — Patient Instructions (Signed)
Visit Information  Thank you for taking time to visit with me today. Please don't hesitate to contact me if I can be of assistance to you.   Following are the goals we discussed today:   Goals Addressed             This Visit's Progress    case management activities       Care Coordination Interventions: Case Management program offered and discussed SDOH screening completed Confirmed that patient would like assistance with Diabetes Management-reports recent discharge from the hospital due to low blood sugar Initial appointment with [facility] scheduled for 09/25/22 at 10:30am         Our next appointment is by telephone on 09/25/22 at 10:30am  Please call the care guide team at [phone] if you need to cancel or reschedule your appointment.   If you are experiencing a Mental Health or [place] or need someone to talk to, please call 911   Patient verbalizes understanding of instructions and care plan provided today and agrees to view in [place]. Active MyChart status and patient understanding of how to access instructions and care plan via MyChart confirmed with patient.     Telephone follow up appointment with care management team member scheduled for: 09/25/22  Elliot Gurney, [place] Worker  [facility] Care Management [phone]

## 2022-09-21 ENCOUNTER — Telehealth: Payer: Self-pay

## 2022-09-21 MED ORDER — MIRTAZAPINE 30 MG PO TABS: 30.0000 mg | ORAL_TABLET | Freq: Every day | ORAL | 1 refills | Status: DC

## 2022-09-21 NOTE — Telephone Encounter (Signed)
Patient is requesting refill of mirtazapine, sent in to [facility].   [name], PharmD, [place], CPP  Clinical Pharmacist Practitioner  [facility] [phone]

## 2022-09-25 ENCOUNTER — Ambulatory Visit: Payer: Self-pay

## 2022-09-25 NOTE — Patient Instructions (Signed)
Visit Information  Thank you for taking time to visit with me today. Please don't hesitate to contact me if I can be of assistance to you.   Following are the goals we discussed today:   Goals Addressed             This Visit's Progress    RNCM: Effective Management of Hypoglycemia       Care Coordination Interventions: Provided education to patient about basic DM disease process. The patient has issues with low blood sugars. The patient states she is doing better and eating better. Issues with getting in with Endocrinologist locally as they are not accepting new patients. The wait list for [facility] is long and she cannot get in to see Endocrinology until February. Extensive education and support given.  Reviewed medications with patient and discussed importance of medication adherence. The patient has her glucagon and also glucose tablets that she received at the hospital. The patient states that she has a reader that lets her know when her blood sugars are low. Counseled on importance of regular laboratory monitoring as prescribed Discussed plans with patient for ongoing care management follow up and provided patient with direct contact information for care management team Provided patient with written educational materials related to hypo and hyperglycemia and importance of correct treatment. Education provided through my chart and during the call. The patient states she is feeling better and feels like she is doing better with managing hypoglycemic events.  Review of heart healthy/ADA diet. Recommendations for eating smaller more frequent meals and making sure to have snacks on hand when she is going out. Education provided Assisted the patient in resetting her password for myChart. Education and support provided.  Reviewed scheduled/upcoming provider appointments including: 11-13-2022 and 140 pm Advised patient, providing education and rationale, to check cbg as directed (has a  continuous reader) and record, calling pcp for findings outside established parameters Referral made to pharmacy team for assistance with works with pharmacist on a regular basis Referral made to social work team for assistance with has worked with the LCSW  Review of patient status, including review of consultants reports, relevant laboratory and other test results, and medications completed Screening for signs and symptoms of depression related to chronic disease state  Assessed social determinant of health barriers The patient agrees to work with the [facility]. The patient provided the [facility] number to call for changes or needs before next outreach. Denies any immediate concerns at this time.            Our next appointment is by telephone on 11-20-2022 at 1115 am  Please call the care guide team at [phone] if you need to cancel or reschedule your appointment.   If you are experiencing a Mental Health or [place] or need someone to talk to, please call the Suicide and Crisis Lifeline: 988 call the [place] National Suicide Prevention Lifeline: (315)712-8426 or TTY: 732-556-4394 TTY (352)118-6985) to talk to a trained counselor call 1-800-273-TALK (toll free, 24 hour hotline)  Patient verbalizes understanding of instructions and care plan provided today and agrees to view in [place]. Active MyChart status and patient understanding of how to access instructions and care plan via MyChart confirmed with patient.     Telephone follow up appointment with care management team member scheduled for: 11-20-2022 at 51 am  [place], MSN, [place]  Mobile: [phone]    Hypoglycemia and How to Treat It In this video you  will learn about hypoglycemia and how it is managed. To view the content, go to this web address: [email or web address]  This video will expire on: 06/08/2024. If you need access to this video following this date,  please reach out to the healthcare provider who assigned it to you. This information is not intended to replace advice given to you by your health care provider. Make sure you discuss any questions you have with your health care provider. Elsevier Patient Education  [place].   Diabetes Mellitus and Nutrition, Adult When you have diabetes, or diabetes mellitus, it is very important to have healthy eating habits because your blood sugar (glucose) levels are greatly affected by what you eat and drink. Eating healthy foods in the right amounts, at about the same times every day, can help you: Manage your blood glucose. Lower your risk of heart disease. Improve your blood pressure. Reach or maintain a healthy weight. What can affect my meal plan? Every person with diabetes is different, and each person has different needs for a meal plan. Your health care provider may recommend that you work with a dietitian to make a meal plan that is best for you. Your meal plan may vary depending on factors such as: The calories you need. The medicines you take. Your weight. Your blood glucose, blood pressure, and cholesterol levels. Your activity level. Other health conditions you have, such as heart or kidney disease. How do carbohydrates affect me? Carbohydrates, also called carbs, affect your blood glucose level more than any other type of food. Eating carbs raises the amount of glucose in your blood. It is important to know how many carbs you can safely have in each meal. This is different for every person. Your dietitian can help you calculate how many carbs you should have at each meal and for each snack. How does alcohol affect me? Alcohol can cause a decrease in blood glucose (hypoglycemia), especially if you use insulin or take certain diabetes medicines by mouth. Hypoglycemia can be a life-threatening condition. Symptoms of hypoglycemia, such as sleepiness, dizziness, and confusion, are  similar to symptoms of having too much alcohol. Do not drink alcohol if: Your health care provider tells you not to drink. You are pregnant, may be pregnant, or are planning to become pregnant. If you drink alcohol: Limit how much you have to: 0-1 drink a day for women. 0-2 drinks a day for men. Know how much alcohol is in your drink. In the [place] one drink equals one 12 oz bottle of beer (355 mL), one 5 oz glass of wine (148 mL), or one 1 oz glass of hard liquor (44 mL). Keep yourself hydrated with water, diet soda, or unsweetened iced tea. Keep in mind that regular soda, juice, and other mixers may contain a lot of sugar and must be counted as carbs. What are tips for following this plan?  Reading food labels Start by checking the serving size on the Nutrition Facts label of packaged foods and drinks. The number of calories and the amount of carbs, fats, and other nutrients listed on the label are based on one serving of the item. Many items contain more than one serving per package. Check the total grams (g) of carbs in one serving. Check the number of grams of saturated fats and trans fats in one serving. Choose foods that have a low amount or none of these fats. Check the number of milligrams (mg) of salt (sodium) in one serving. Most people  should limit total sodium intake to less than 2,300 mg per day. Always check the nutrition information of foods labeled as "low-fat" or "nonfat." These foods may be higher in added sugar or refined carbs and should be avoided. Talk to your dietitian to identify your daily goals for nutrients listed on the label. Shopping Avoid buying canned, pre-made, or processed foods. These foods tend to be high in fat, sodium, and added sugar. Shop around the outside edge of the grocery store. This is where you will most often find fresh fruits and vegetables, bulk grains, fresh meats, and fresh dairy products. Cooking Use low-heat cooking methods, such as  baking, instead of high-heat cooking methods, such as deep frying. Cook using healthy oils, such as olive, canola, or sunflower oil. Avoid cooking with butter, cream, or high-fat meats. Meal planning Eat meals and snacks regularly, preferably at the same times every day. Avoid going long periods of time without eating. Eat foods that are high in fiber, such as fresh fruits, vegetables, beans, and whole grains. Eat 4-6 oz (112-168 g) of lean protein each day, such as lean meat, chicken, fish, eggs, or tofu. One ounce (oz) (28 g) of lean protein is equal to: 1 oz (28 g) of meat, chicken, or fish. 1 egg.  cup (62 g) of tofu. Eat some foods each day that contain healthy fats, such as avocado, nuts, seeds, and fish. What foods should I eat? Fruits Berries. Apples. Oranges. Peaches. Apricots. Plums. Grapes. Mangoes. Papayas. Pomegranates. Kiwi. Cherries. Vegetables Leafy greens, including lettuce, spinach, kale, chard, collard greens, mustard greens, and cabbage. Beets. Cauliflower. Broccoli. Carrots. Green beans. Tomatoes. Peppers. Onions. Cucumbers. Brussels sprouts. Grains Whole grains, such as whole-wheat or whole-grain bread, crackers, tortillas, cereal, and pasta. Unsweetened oatmeal. Quinoa. Brown or wild rice. Meats and other proteins Seafood. Poultry without skin. Lean cuts of poultry and beef. Tofu. Nuts. Seeds. Dairy Low-fat or fat-free dairy products such as milk, yogurt, and cheese. The items listed above may not be a complete list of foods and beverages you can eat and drink. Contact a dietitian for more information. What foods should I avoid? Fruits Fruits canned with syrup. Vegetables Canned vegetables. Frozen vegetables with butter or cream sauce. Grains Refined white flour and flour products such as bread, pasta, snack foods, and cereals. Avoid all processed foods. Meats and other proteins Fatty cuts of meat. Poultry with skin. Breaded or fried meats. Processed meat. Avoid  saturated fats. Dairy Full-fat yogurt, cheese, or milk. Beverages Sweetened drinks, such as soda or iced tea. The items listed above may not be a complete list of foods and beverages you should avoid. Contact a dietitian for more information. Questions to ask a health care provider Do I need to meet with a certified diabetes care and education specialist? Do I need to meet with a dietitian? What number can I call if I have questions? When are the best times to check my blood glucose? Where to find more information: American Diabetes Association: diabetes.org Academy of Nutrition and Dietetics: eatright.[employer] of Diabetes and Digestive and Kidney Diseases: Amen[employer].is Association of Diabetes Care & Education Specialists: diabeteseducator.org Summary It is important to have healthy eating habits because your blood sugar (glucose) levels are greatly affected by what you eat and drink. It is important to use alcohol carefully. A healthy meal plan will help you manage your blood glucose and lower your risk of heart disease. Your health care provider may recommend that you work with a dietitian to  make a meal plan that is best for you. This information is not intended to replace advice given to you by your health care provider. Make sure you discuss any questions you have with your health care provider. Document Revised: 07/06/2020 Document Reviewed: 07/06/2020 Elsevier Patient Education  [place].

## 2022-09-25 NOTE — Patient Outreach (Signed)
  Care Coordination   Initial Visit Note   09/25/2022 Name: Julie Acevedo MRN: [id number] DOB: 1951-05-01  Julie Acevedo is a 71 y.o. year old female who sees Julie Acevedo., MD for primary care. I spoke with  Julie Acevedo by phone today.  What matters to the patients health and wellness today?  Keeping blood sugars level and not having low drops    Goals Addressed             This Visit's Progress    RNCM: Effective Management of Hypoglycemia       Care Coordination Interventions: Provided education to patient about basic DM disease process. The patient has issues with low blood sugars. The patient states she is doing better and eating better. Issues with getting in with Endocrinologist locally as they are not accepting new patients. The wait list for Julie Acevedo is long and she cannot get in to see Endocrinology until February. Extensive education and support given.  Reviewed medications with patient and discussed importance of medication adherence. The patient has her glucagon and also glucose tablets that she received at the hospital. The patient states that she has a reader that lets her know when her blood sugars are low. Counseled on importance of regular laboratory monitoring as prescribed Discussed plans with patient for ongoing care management follow up and provided patient with direct contact information for care management team Provided patient with written educational materials related to hypo and hyperglycemia and importance of correct treatment. Education provided through my chart and during the call. The patient states she is feeling better and feels like she is doing better with managing hypoglycemic events.  Review of heart healthy/ADA diet. Recommendations for eating smaller more frequent meals and making sure to have snacks on hand when she is going out. Education provided Assisted the patient in resetting her password for myChart. Education and support provided.   Reviewed scheduled/upcoming provider appointments including: 11-13-2022 and 140 pm Advised patient, providing education and rationale, to check cbg as directed (has a continuous reader) and record, calling pcp for findings outside established parameters Referral made to pharmacy team for assistance with works with pharmacist on a regular basis Referral made to social work team for assistance with has worked with the Julie Acevedo  Review of patient status, including review of consultants reports, relevant laboratory and other test results, and medications completed Screening for signs and symptoms of depression related to chronic disease state  Assessed social determinant of health barriers The patient agrees to work with the Julie Acevedo. The patient provided the Julie Acevedo number to call for changes or needs before next outreach. Denies any immediate concerns at this time.            SDOH assessments and interventions completed:  Yes  SDOH Interventions Today    Flowsheet Row Most Recent Value  SDOH Interventions   Food Insecurity Interventions Intervention Not Indicated  Utilities Interventions Intervention Not Indicated        Care Coordination Interventions Activated:  Yes  Care Coordination Interventions:  Yes, provided   Follow up plan: Follow up call scheduled for 11-20-2022 at 11:15 am    Encounter Outcome:  Pt. Visit Completed   Julie Larsson RN, MSN, [place]  Mobile: [phone]

## 2022-10-03 ENCOUNTER — Other Ambulatory Visit: Payer: Self-pay | Admitting: Family Medicine

## 2022-10-03 DIAGNOSIS — E1122 Type 2 diabetes mellitus with diabetic chronic kidney disease: Secondary | ICD-10-CM

## 2022-10-09 ENCOUNTER — Telehealth: Payer: Self-pay

## 2022-10-09 NOTE — Progress Notes (Signed)
Chronic Care Management Pharmacy Assistant   Name: Julie Acevedo  MRN: [id number] DOB: 1951/06/21  Reason for Encounter: Medication Review/Medication Coordination for [facility]   Recent office visits:  None ID  Recent consult visits:  09/27/2022 Lyla Son, MD (Nephrology) for Follow-up-No new medication changes noted, Lab Orders placed, patient to follow-up in 4 months  Hospital visits:  None in previous 6 months  Medications: Outpatient Encounter Medications as of 10/09/2022  Medication Sig Note   ACCU-CHEK GUIDE test strip USE TO CHECK BLOOD SUGAR UP TO 4 TIMES A DAY AS DIRECTED    atorvastatin (LIPITOR) 40 MG tablet TAKE ONE TABLET BY MOUTH EVERY MORNING    blood glucose meter kit and supplies Dispense Accu-Chek Guide. Use up to four times daily as directed. 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    calcitRIOL (ROCALTROL) 0.25 MCG capsule Take 0.25 mcg by mouth daily.    copper tablet Take 1 tablet (2 mg total) by mouth daily.    cyanocobalamin (VITAMIN B12) 500 MCG tablet Take 1 tablet (500 mcg total) by mouth daily.    diazoxide (PROGLYCEM) 50 MG/ML suspension Take 1 mL (50 mg total) by mouth every 8 (eight) hours.    gabapentin (NEURONTIN) 300 MG capsule Take 1 capsule (300 mg total) by mouth 3 (three) times daily.    Glucagon (GVOKE HYPOPEN 2-PACK) 1 MG/0.2ML SOAJ Inject 0.2 mLs into the skin as needed.    hydrOXYzine (VISTARIL) 100 MG capsule Take 1 capsule (100 mg total) by mouth 3 (three) times daily.    linaclotide (LINZESS) 290 MCG CAPS capsule TAKE 1 CAPSULE BY MOUTH EVERY DAY BEFORE BREAKFAST    metoprolol succinate (TOPROL-XL) 25 MG 24 hr tablet TAKE 1 TABLET (25 MG TOTAL) BY MOUTH DAILY.    mirtazapine (REMERON) 30 MG tablet Take 1 tablet (30 mg total) by mouth at bedtime.    omeprazole (PRILOSEC) 20 MG capsule Take 1 capsule (20 mg total) by mouth daily.    ondansetron (ZOFRAN) 4 MG tablet TAKE 1 TABLET BY MOUTH EVERY 8 HOURS AS NEEDED (Patient taking  differently: Take 4 mg by mouth every 8 (eight) hours as needed for nausea or vomiting.)    polyethylene glycol powder (MIRALAX) 17 GM/SCOOP powder Take 1 scoop or 17 g 3 times a day for 2 days then use 1 scoop twice a day a day until you begin having soft bowel movements then cut back to 1 scoop a day.    rivastigmine (EXELON) 3 MG capsule Take 3 mg by mouth 2 (two) times daily. 09/10/2022: Prescribed by Dr. [name]   traMADol (ULTRAM) 50 MG tablet Take 1 tablet (50 mg total) by mouth every 6 (six) hours as needed for moderate pain or severe pain.    zinc gluconate 50 MG tablet Take 1 tablet (50 mg total) by mouth daily.    No facility-administered encounter medications on file as of 10/09/2022.   Care Gaps: Medicare Annual Wellness Diabetic Eye Exam Diabetic Foot Exam Influenza Vaccine   Star Rating Drugs: Atorvastatin 40 mg last filled on10/01/2022 for a 30-Day supply with [facility]  BP Readings from Last 3 Encounters:  08/31/22 133/67  08/30/22 (!) 166/76  08/21/22 120/84    Lab Results  Component Value Date   HGBA1C 5.4 08/09/2022    Patient obtains medications through Vials  30 Days   Last adherence delivery included:  Atorvastatin 40 mg 1 tablet daily- (Breakfast) Calcitriol 0.25 mcg 1 tablet daily- (Breakfast) Gabapentin 300 mg 1  capsule three times daily-(Breakfast, Lunch, Bedtime) Hydroxyzine 100 mg 1 capsule three times daily-(Breakfast, Lunch, Bedtime) Linzess 290 mcg caps 1 cap daily (Before Breakfast) Metoprolol 25 mg XL 1 tablet daily -(Breakfast) Mirtazapine 15 mg 1 tablet daily -(Bedtime) Omeprazole 20 mg  1 capsule by mouth daily -(Breakfast) Rivastigmine 3 mg twice daily- (Breakfast, Bedtime)  Patient declined the following medications for the month of October: No medications were declined for this month  Patient is due for next adherence delivery on: 10/19/2022 2nd Route.  Called patient and reviewed medications and coordinated delivery.  This  delivery to include: Atorvastatin 40 mg 1 tablet daily- (Breakfast) Mirtazapine 15 mg 1 tablet daily -(Bedtime) Gabapentin 300 mg 1 capsule three times daily-(Breakfast, Lunch, Bedtime) Hydroxyzine 100 mg 1 capsule three times daily-(Breakfast, Lunch, Bedtime) Linzess 290 mcg caps 1 cap daily (Before Breakfast) Rivastigmine 3 mg twice daily- (Breakfast, Bedtime) Omeprazole 20 mg  1 capsule by mouth daily -(Breakfast)  Patient declined the following medications for the month of November: No medications were declined for this month  Confirmed delivery date of 10/19/2022 2nd Route, advised patient that pharmacy will contact them the morning of delivery.  I spoke with the patient and she reports she is feeling  a little sick to her stomach. She denies fever, or vomiting. She stated sometimes when she takes her medications it makes her stomach not feel well. Patient denies any other ill symptoms at this time. Patient has no concerns or issues today.  Patient has a telephone follow-up appointment with [name], CPP on 10/22/2022 @ 1000.  Lynann Bologna, CPA/CMA Clinical Pharmacist Assistant Phone: [phone]

## 2022-10-19 ENCOUNTER — Telehealth: Payer: Medicare Other

## 2022-10-22 ENCOUNTER — Telehealth: Payer: Medicare Other

## 2022-10-22 NOTE — Progress Notes (Deleted)
Chronic Care Management Pharmacy Note  10/22/2022 Name:  Julie Acevedo MRN:  [id number] DOB:  1951-05-24  Summary: Patient presents for CCM follow-up.   -Home blood pressures elevated in the 150s, one instance of 160s. Has noticed a mild headache more frequently.   -She has multiple medications that are overdue to be filled and has been out of atorvastatin and metformin for some time.  Recommendations/Changes made from today's visit: -Continue current medications for now, counseled patient to cut down on caffeine intake, continue sodium restriction and monitor blood pressure daily.   -HC to check on home and nurse visit blood pressures this Friday.   -[facility] onboarded to [facility], refills of atorvastatin and metformin sent.   Plan: HC BP assessment in <1 week  CPP follow-up 1 month  Subjective: Julie Acevedo is an 71 y.o. year old female who is a primary patient of Jerrol Banana., MD.  The CCM team was consulted for assistance with disease management and care coordination needs.    Engaged with patient by telephone for follow up visit in response to provider referral for pharmacy case management and/or care coordination services.   Consent to Services:  The patient was given information about Chronic Care Management services, agreed to services, and gave verbal consent prior to initiation of services.  Please see initial visit note for detailed documentation.   Patient Care Team: Jerrol Banana., MD as PCP - General (Family Medicine) Yolonda Kida, MD as PCP - Cardiology (Cardiology) Lorelee Cover., MD as Consulting Physician (Ophthalmology) Bjorn Loser, MD as Consulting Physician (Urology) Bary Castilla, Forest Gleason, MD as Consulting Physician (General Surgery) Jules Husbands, MD as Consulting Physician (General Surgery) Gardiner Barefoot, DPM as Consulting Physician (Podiatry) Gae Dry, MD as Referring Physician (Obstetrics and  Gynecology) Vladimir Crofts, MD as Consulting Physician (Neurology) Randon Goldsmith, OT as Occupational Therapist (Occupational Therapy) Lloyd Huger, MD as Consulting Physician (Oncology) Vanita Ingles, RN as Case Manager (General Practice)  Recent office visits: 06/13/22: Patient presented to Dr. Rosanna Randy for follow-up. Stopped trazodone.   05/01/22: Patient presented to Dr. Rosanna Randy for follow-up.   Recent consult visits: 09/27/22: Patient presented to Dr. Lanora Manis (Nephrology) for follow-up. 05/24/22: Patient presented to Dr. Lanora Manis (Nephrology) for follow-up.  05/04/22: Patient presented to Dr. [name] (Neurology) for follow-up. Stop trazodone, start mirtazapine 15 mg nightly.   Hospital visits: 08/31/22: Patient presented to ED for constipation 08/21/22: Patient presented to ED for hypoglycemia  8/23/-08/14/22: Patient hospitalized for hypogylcemia  04/22/22: Patient presented to ED for chest pain.   Objective:  Lab Results  Component Value Date   CREATININE 1.58 (H) 08/31/2022   BUN 16 08/31/2022   GFRNONAA 35 (L) 08/31/2022   GFRAA 42 (L) 12/05/2020   NA 142 08/31/2022   K 3.6 08/31/2022   CALCIUM 8.8 (L) 08/31/2022   CO2 26 08/31/2022    Lab Results  Component Value Date/Time   HGBA1C 5.4 08/09/2022 06:40 PM   HGBA1C 5.1 02/13/2022 02:36 PM   HGBA1C 5.6 09/06/2021 04:43 AM   HGBA1C 6.3 01/18/2014 06:32 AM   MICROALBUR 20 06/23/2020 02:10 PM   MICROALBUR 50 11/18/2017 09:33 AM    Last diabetic Eye exam:  Lab Results  Component Value Date/Time   HMDIABEYEEXA No Retinopathy 12/03/2019 12:00 AM    Last diabetic Foot exam: No results found for: "HMDIABFOOTEX"   Lab Results  Component Value Date   CHOL 134 09/06/2021   HDL 60  09/06/2021   LDLCALC 63 09/06/2021   TRIG 54 09/06/2021   CHOLHDL 2.2 09/06/2021       Latest Ref Rng & Units 08/31/2022    6:45 AM 08/30/2022    8:42 PM 08/21/2022    3:52 AM  Hepatic Function  Total Protein 6.5 - 8.1 g/dL 6.2   7.2  5.9   Albumin 3.5 - 5.0 g/dL 3.0  3.7  3.0   AST 15 - 41 U/L 48  57  132   ALT 0 - 44 U/L 47  58  129   Alk Phosphatase 38 - 126 U/L 149  161  141   Total Bilirubin 0.3 - 1.2 mg/dL 0.4  0.5  0.4     Lab Results  Component Value Date/Time   TSH 3.012 08/08/2022 12:43 PM   TSH 1.896 06/28/2021 01:23 PM   TSH 3.730 12/05/2020 08:55 AM   TSH 2.170 08/18/2020 11:36 AM   FREET4 0.91 06/28/2021 01:23 PM       Latest Ref Rng & Units 08/31/2022    6:45 AM 08/21/2022    3:52 AM 08/13/2022    3:32 AM  CBC  WBC 4.0 - 10.5 K/uL 8.3  7.5  8.7   Hemoglobin 12.0 - 15.0 g/dL 10.7  8.5  9.4   Hematocrit 36.0 - 46.0 % 35.0  28.2  29.4   Platelets 150 - 400 K/uL 279  270  223     Lab Results  Component Value Date/Time   VD25OH 51.23 08/09/2022 06:40 PM   VD25OH 8.7 (L) 06/16/2019 03:51 PM    Clinical ASCVD: Yes  The ASCVD Risk score (Arnett DK, et al., 2019) failed to calculate for the following reasons:   The patient has a prior MI or stroke diagnosis       09/20/2022    3:37 PM 04/24/2022    1:17 PM 03/28/2022    8:30 AM  Depression screen PHQ 2/9  Decreased Interest 0 0   Down, Depressed, Hopeless 0 0 0  PHQ - 2 Score 0 0 0    Social History   Tobacco Use  Smoking Status Never  Smokeless Tobacco Never   BP Readings from Last 3 Encounters:  08/31/22 133/67  08/30/22 (!) 166/76  08/21/22 120/84   Pulse Readings from Last 3 Encounters:  08/31/22 68  08/30/22 76  08/21/22 64   Wt Readings from Last 3 Encounters:  08/31/22 123 lb (55.8 kg)  08/30/22 123 lb (55.8 kg)  08/21/22 128 lb 14.4 oz (58.5 kg)    Assessment/Interventions: Review of patient past medical history, allergies, medications, health status, including review of consultants reports, laboratory and other test data, was performed as part of comprehensive evaluation and provision of chronic care management services.   SDOH:  (Social Determinants of Health) assessments and interventions performed: Yes SDOH  Interventions    Flowsheet Row Care Coordination from 09/25/2022 in [place] Most recent reading at 09/25/2022 10:50 AM Care Coordination from 09/20/2022 in [place] Most recent reading at 09/20/2022  3:33 PM Clinical Support from 03/28/2022 in [facility] Most recent reading at 03/28/2022  8:31 AM Chronic Care Management from 12/26/2021 in [facility] Most recent reading at 01/01/2022 10:11 AM Patient Outreach Telephone from 12/26/2021 in [facility] Most recent reading at 12/26/2021 11:13 AM Chronic Care Management from 12/12/2021 in [facility] Most recent reading at 12/12/2021 11:35 AM  SDOH Interventions  Food Insecurity Interventions Intervention Not Indicated Intervention Not Indicated Intervention Not Indicated -- -- Intervention Not Indicated  Housing Interventions -- Intervention Not Indicated Intervention Not Indicated -- -- --  Transportation Interventions -- Intervention Not Indicated Intervention Not Indicated -- -- Intervention Not Indicated  Utilities Interventions Intervention Not Indicated Intervention Not Indicated -- -- -- --  Depression Interventions/Treatment  -- -- -- -- Currently on Treatment --  Financial Strain Interventions -- -- Intervention Not Indicated Intervention Not Indicated -- --  Physical Activity Interventions -- -- Intervention Not Indicated -- -- --  Stress Interventions -- -- Intervention Not Indicated -- -- --  Social Connections Interventions -- -- Intervention Not Indicated -- -- --          CCM Care Plan  Allergies  Allergen Reactions   Lotrel [Amlodipine Besy-Benazepril Hcl] Anaphylaxis   Contrast Media [Iodinated Contrast Media] Swelling and Other (See Comments)    Causes neck swelling    Niacin Hives   Sulfa Antibiotics Hives    Medications Reviewed Today     Reviewed by Vanita Ingles, RN  (Case Manager) on 09/25/22 at [facility] List Status: <None>   Medication Order Taking? Sig Documenting Provider Last Dose Status Informant  atorvastatin (LIPITOR) 40 MG tablet [id number]  TAKE ONE TABLET BY MOUTH EVERY MORNING Jerrol Banana., MD  Active   blood glucose meter kit and supplies [id number] No Dispense Accu-Chek Guide. Use up to four times daily as directed. 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 Jerrol Banana., MD Taking Active   calcitRIOL (ROCALTROL) 0.25 MCG capsule [id number] No Take 0.25 mcg by mouth daily. [provider] Taking Active Self  copper tablet [id number] No Take 1 tablet (2 mg total) by mouth daily. Elgergawy, Silver Huguenin, MD Taking Active   cyanocobalamin (VITAMIN B12) 500 MCG tablet [id number] No Take 1 tablet (500 mcg total) by mouth daily. Elgergawy, Silver Huguenin, MD Taking Active   diazoxide (PROGLYCEM) 50 MG/ML suspension [id number] No Take 1 mL (50 mg total) by mouth every 8 (eight) hours. Elgergawy, Silver Huguenin, MD Taking Active   gabapentin (NEURONTIN) 300 MG capsule [id number] No Take 1 capsule (300 mg total) by mouth 3 (three) times daily. Jerrol Banana., MD Taking Active Self  Glucagon (GVOKE HYPOPEN 2-PACK) 1 MG/0.2ML Darden Palmer [id number]  Inject 0.2 mLs into the skin as needed. Jerrol Banana., MD  Active   glucose blood test strip [id number] No Use as instructed. OneTouch test strips. Jerrol Banana., MD Taking Active   hydrOXYzine (VISTARIL) 100 MG capsule [id number] No Take 1 capsule (100 mg total) by mouth 3 (three) times daily. Jerrol Banana., MD Taking Active Self  linaclotide [facility]) 290 MCG CAPS capsule [id number] No TAKE 1 CAPSULE BY MOUTH EVERY DAY BEFORE BREAKFAST Jerrol Banana., MD Taking Active Self  metoprolol succinate (TOPROL-XL) 25 MG 24 hr tablet [id number] No TAKE 1 TABLET (25 MG TOTAL) BY MOUTH DAILY. Jerrol Banana., MD Taking Active   mirtazapine (REMERON) 30 MG tablet [id number]  Take 1 tablet (30  mg total) by mouth at bedtime. Simmons-Robinson, Makiera, MD  Active   omeprazole (PRILOSEC) 20 MG capsule [id number] No Take 1 capsule (20 mg total) by mouth daily. Jerrol Banana., MD Taking Active Self  ondansetron [facility]) 4 MG tablet [id number] No TAKE 1 TABLET BY MOUTH EVERY 8 HOURS AS NEEDED  Patient taking differently: Take 4 mg by mouth every 8 (eight) hours as needed  for nausea or vomiting.   Jerrol Banana., MD Taking Active Self  polyethylene glycol powder [facility]) 17 GM/SCOOP powder [id number]  Take 1 scoop or 17 g 3 times a day for 2 days then use 1 scoop twice a day a day until you begin having soft bowel movements then cut back to 1 scoop a day. Nena Polio, MD  Active   rivastigmine (EXELON) 3 MG capsule [id number] No Take 3 mg by mouth 2 (two) times daily. [provider] Taking Active            Med Note Michaelle Birks, Alda Lea Sep 10, 2022  1:15 PM) Prescribed by Dr. [name]  traMADol Veatrice [facility]bon) 50 MG tablet [id number]  Take 1 tablet (50 mg total) by mouth every 6 (six) hours as needed for moderate pain or severe pain. Jerrol Banana., MD  Active   zinc gluconate 50 MG tablet [id number] No Take 1 tablet (50 mg total) by mouth daily. 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, psychotic disturbance, mood disturbance, or anxiety ([place]) 11/22/2021   TIA (transient ischemic attack) 09/05/2021   Stomatitis and mucositis 07/21/2021   Cheilitis 07/21/2021   Proteinuria, unspecified 05/04/2021   Stage 3b chronic kidney disease ([place]) 05/04/2021   Anemia in chronic kidney disease  05/04/2021   Hypokalemia 05/04/2021   Other and unspecified hyperlipidemia 03/17/2021   Callus of foot 01/19/2021   Pes planus 01/19/2021   PTTD (posterior tibial tendon dysfunction) 01/19/2021   Acute flank pain 12/15/2020   Cystitis 12/15/2020   Urinary symptom or sign 12/15/2020   Low back pain radiating to right lower extremity 06/23/2020   Gastroesophageal reflux disease 04/28/2020   Large bowel obstruction ([place]) 04/17/2019   Acquired trigger finger 03/20/2019   Iron deficiency anemia 11/21/2018   Syncope 03/18/2018   Anemia 03/18/2018   Exophthalmos 02/22/2018   Multiple thyroid nodules 02/22/2018   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   Pain at surgical site 08/30/2016   Ventral incisional hernia 08/21/2016   SBO (small bowel obstruction) (HCC)    Partial small bowel obstruction ([place]) 04/23/2016   Adjustment disorder with mixed anxiety and depressed mood 04/23/2016   Memory difficulties 09/01/2015   Arthritis 06/11/2015   Back pain, chronic 06/11/2015   HLD (hyperlipidemia) 06/11/2015   Cannot sleep 06/11/2015   L-S radiculopathy 06/11/2015   Arthritis of knee, degenerative 06/11/2015   B12 deficiency 06/11/2015   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   Type 2 diabetes mellitus ([place]) 11/15/2012   Avitaminosis D 11/07/2009   Narrowing of intervertebral disc space 07/25/2009   Benign essential HTN 07/25/2009    Immunization History  Administered Date(s) Administered   Fluad Quad(high Dose 65+) 08/15/2019, 09/01/2020, 09/13/2021   Influenza Split 10/17/2010, 10/02/2011, 08/27/2012   Influenza, High Dose Seasonal PF 08/29/2018   Influenza,inj,Quad PF,6+ Mos 09/26/2013, 09/06/2014, 09/06/2016   Influenza,inj,Quad PF,6-35 Mos 08/31/2017   Influenza-Unspecified 09/15/2015   PFIZER Comirnaty(Gray Top)Covid-19 Tri-Sucrose Vaccine 04/05/2021   PFIZER(Purple Top)SARS-COV-2 Vaccination 01/29/2020, 02/23/2020,  10/11/2020   PNEUMOCOCCAL  CONJUGATE-20 06/24/2021   Pneumococcal Conjugate-13 08/29/2018   Pneumococcal Polysaccharide-23 11/17/2012   Tdap 10/02/2011, 09/27/2016   Zoster Recombinat (Shingrix) 08/29/2018, 08/15/2019   Zoster, Live 09/06/2016    Conditions to be addressed/monitored:  Hypertension, Hyperlipidemia, Diabetes, GERD and Chronic Pain  There are no care plans that you recently modified to display for this patient.    Medication Assistance: None required.  Patient affirms current coverage meets needs.  Compliance/Adherence/Medication fill history: Care Gaps: Diabetic Eye Exam Urine Microalbumin COVID-19 Vaccine Booster 5  Star-Rating Drugs: Atorvastatin 40 mg ast filled on 09/08/2021 for a 90-Day supply Metformin 500 mg last filled on 07/16/2021 for a 90-Day supply with [facility]  Patient's preferred pharmacy is:  [facility] - [place], [place] - [address] [place] [address] Phone: [phone] Fax: [phone]  [id number] [place] [address]Phone: [phone]: [phone] [facility] [address]: [phone]: [phone] Uses pill box? No Pt endorses 50% compliance  We discussed: Verbal consent obtained for [facility] enhanced pharmacy services (medication synchronization, adherence packaging, delivery coordination). A medication sync plan was created to allow patient to get all medications delivered once every 30 to 90 days per patient preference. Patient understands they have freedom to choose pharmacy and clinical pharmacist will coordinate care between all prescribers and [facility].  Patient decided to: Utilize [facility] for medication synchronization, packaging and delivery  Care Plan and Follow Up Patient Decision:  Patient  agrees to Care Plan and Follow-up.  Plan: Telephone follow up appointment with care management team member scheduled for:  07/31/2022 at 11:00 AM

## 2022-10-26 MED FILL — Iron Sucrose Inj 20 MG/ML (Fe Equiv): INTRAVENOUS | Qty: 10 | Status: AC

## 2022-10-29 ENCOUNTER — Other Ambulatory Visit: Payer: Self-pay

## 2022-10-29 ENCOUNTER — Inpatient Hospital Stay: Payer: Medicaid Other

## 2022-10-29 ENCOUNTER — Inpatient Hospital Stay: Payer: Medicaid Other | Admitting: Nurse Practitioner

## 2022-10-29 ENCOUNTER — Telehealth: Payer: Self-pay | Admitting: Nurse Practitioner

## 2022-10-29 DIAGNOSIS — D631 Anemia in chronic kidney disease: Secondary | ICD-10-CM

## 2022-10-29 NOTE — Telephone Encounter (Signed)
this patient called at lunch and got answering service. She wants to cancel her appt today for lab/ MD/ venofer.Team was notified. Patient would like a call back to reschedule.

## 2022-11-01 ENCOUNTER — Inpatient Hospital Stay: Payer: Medicare Other | Attending: Oncology

## 2022-11-01 DIAGNOSIS — D509 Iron deficiency anemia, unspecified: Secondary | ICD-10-CM | POA: Diagnosis not present

## 2022-11-01 DIAGNOSIS — D631 Anemia in chronic kidney disease: Secondary | ICD-10-CM

## 2022-11-01 DIAGNOSIS — N289 Disorder of kidney and ureter, unspecified: Secondary | ICD-10-CM | POA: Diagnosis not present

## 2022-11-01 LAB — CBC WITH DIFFERENTIAL/PLATELET
Abs Immature Granulocytes: 0.02 10*3/uL (ref 0.00–0.07)
Basophils Absolute: 0 10*3/uL (ref 0.0–0.1)
Basophils Relative: 0 %
Eosinophils Absolute: 0.1 10*3/uL (ref 0.0–0.5)
Eosinophils Relative: 1 %
HCT: 35.2 % — ABNORMAL LOW (ref 36.0–46.0)
Hemoglobin: 11.3 g/dL — ABNORMAL LOW (ref 12.0–15.0)
Immature Granulocytes: 0 %
Lymphocytes Relative: 28 %
Lymphs Abs: 1.8 10*3/uL (ref 0.7–4.0)
MCH: 27.6 pg (ref 26.0–34.0)
MCHC: 32.1 g/dL (ref 30.0–36.0)
MCV: 85.9 fL (ref 80.0–100.0)
Monocytes Absolute: 0.4 10*3/uL (ref 0.1–1.0)
Monocytes Relative: 7 %
Neutro Abs: 4 10*3/uL (ref 1.7–7.7)
Neutrophils Relative %: 64 %
Platelets: 293 10*3/uL (ref 150–400)
RBC: 4.1 MIL/uL (ref 3.87–5.11)
RDW: 13.7 % (ref 11.5–15.5)
WBC: 6.3 10*3/uL (ref 4.0–10.5)
nRBC: 0 % (ref 0.0–0.2)

## 2022-11-01 LAB — IRON AND TIBC
Iron: 83 ug/dL (ref 28–170)
Saturation Ratios: 31 % (ref 10.4–31.8)
TIBC: 269 ug/dL (ref 250–450)
UIBC: 186 ug/dL

## 2022-11-01 LAB — FERRITIN: Ferritin: 471 ng/mL — ABNORMAL HIGH (ref 11–307)

## 2022-11-02 ENCOUNTER — Encounter: Payer: Self-pay | Admitting: Nurse Practitioner

## 2022-11-02 ENCOUNTER — Inpatient Hospital Stay (HOSPITAL_BASED_OUTPATIENT_CLINIC_OR_DEPARTMENT_OTHER): Payer: Medicare Other | Admitting: Nurse Practitioner

## 2022-11-02 ENCOUNTER — Inpatient Hospital Stay: Payer: Medicare Other

## 2022-11-02 ENCOUNTER — Telehealth: Payer: Self-pay

## 2022-11-02 VITALS — BP 138/74 | HR 61 | Temp 97.8°F | Ht 63.0 in | Wt 126.0 lb

## 2022-11-02 DIAGNOSIS — D509 Iron deficiency anemia, unspecified: Secondary | ICD-10-CM

## 2022-11-02 DIAGNOSIS — D631 Anemia in chronic kidney disease: Secondary | ICD-10-CM

## 2022-11-02 DIAGNOSIS — N1832 Chronic kidney disease, stage 3b: Secondary | ICD-10-CM

## 2022-11-02 NOTE — Progress Notes (Signed)
[place]  [phone]) [phone] Fax:(336) [phone]  ID: [name] OB: 1951/03/19  MR#: [id number]  TXH#:[id number]  Patient Care Team: Jerrol Banana., MD as PCP - General (Family Medicine) Yolonda Kida, MD as PCP - Cardiology (Cardiology) Lorelee Cover., MD as Consulting Physician (Ophthalmology) Bjorn Loser, MD as Consulting Physician (Urology) Bary Castilla, Forest Gleason, MD as Consulting Physician (General Surgery) Jules Husbands, MD as Consulting Physician (General Surgery) Gardiner Barefoot, DPM as Consulting Physician (Podiatry) Gae Dry, MD as Referring Physician (Obstetrics and Gynecology) Vladimir Crofts, MD as Consulting Physician (Neurology) Randon Goldsmith, OT as Occupational Therapist (Occupational Therapy) Lloyd Huger, MD as Consulting Physician (Oncology) Vanita Ingles, RN as Case Manager (General Practice)  CHIEF COMPLAINT: Iron deficiency anemia  INTERVAL HISTORY: Patient returns to clinic today for repeat laboratory work, further evaluation, consideration of additional IV Venofer.  She currently feels well and is asymptomatic.  She does not complain of any weakness or fatigue.  Her abdominal pain has resolved with dietary changes.  She has no neurologic complaints.  She denies any recent fevers or illnesses.  She has no chest pain, cough, hemoptysis, or shortness of breath.  She denies any nausea, vomiting, constipation, or diarrhea.  She denies any melena or hematochezia.  She has no urinary complaints.  Patient offers no further specific complaints today.  REVIEW OF SYSTEMS:   Review of Systems  Constitutional: Negative.  Negative for fever, malaise/fatigue and weight loss.  Respiratory: Negative.  Negative for cough and shortness of breath.   Cardiovascular: Negative.  Negative for chest pain and leg swelling.  Gastrointestinal: Negative.  Negative for abdominal pain, blood in stool and melena.   Genitourinary: Negative.  Negative for hematuria.  Musculoskeletal: Negative.  Negative for back pain.  Skin: Negative.  Negative for rash.  Neurological:  Negative for dizziness, focal weakness, weakness and headaches.  Psychiatric/Behavioral: Negative.  The patient is not nervous/anxious.   As per HPI. Otherwise, a complete review of systems is negative.  PAST MEDICAL HISTORY: Past Medical History:  Diagnosis Date   Anemia    Arthritis    COVID-19 11/19/2019   Diverticulitis    DM (diabetes mellitus) ([place])    typ e 2   History of methicillin resistant staphylococcus aureus (MRSA) 2017   HTN (hypertension)    Hyperlipidemia    Memory difficulties 09/01/2015   Multiple thyroid nodules    Myocardial infarction ([place]) 1995   Short-term memory loss    Thyroid nodule     PAST SURGICAL HISTORY: Past Surgical History:  Procedure Laterality Date   ABDOMINAL HYSTERECTOMY     due to endometriosis-1 ovary left   BREAST EXCISIONAL BIOPSY Right 1971   neg   CARDIAC CATHETERIZATION  2000   no significant CAD per note of Dr Caryl Comes in 2013    [place] N/A 03/19/2017   Procedure: ANTERIOR REPAIR (CYSTOCELE);  Surgeon: Bjorn Loser, MD;  Location: [facility];  Service: Urology;  Laterality: N/A;   CYSTOSCOPY N/A 03/19/2017   Procedure: CYSTOSCOPY;  Surgeon: Bjorn Loser, MD;  Location: [facility];  Service: Urology;  Laterality: N/A;   ESOPHAGOGASTRODUODENOSCOPY (EGD) WITH PROPOFOL N/A 12/15/2021   Procedure: ESOPHAGOGASTRODUODENOSCOPY (EGD) WITH PROPOFOL;  Surgeon: Lesly Rubenstein, MD;  Location: [facility];  Service: Endoscopy;  Laterality: N/A;   EYE SURGERY     FOOT SURGERY     HAND SURGERY     HEMICOLECTOMY  INSERTION OF MESH N/A 08/21/2016   Procedure: INSERTION OF MESH;  Surgeon: Excell Seltzer, MD;  Location: [facility];  Service: General;  Laterality: N/A;   INSERTION OF MESH N/A 12/08/2019   Procedure: INSERTION OF MESH;  Surgeon: Jules Husbands, MD;  Location: [facility];  Service: General;  Laterality: N/A;   LAPAROSCOPIC LYSIS OF ADHESIONS N/A 08/21/2016   Procedure: LAPAROSCOPIC LYSIS OF ADHESIONS;  Surgeon: Excell Seltzer, MD;  Location: [facility];  Service: General;  Laterality: N/A;   LAPAROSCOPIC REMOVAL ABDOMINAL MASS     LAPAROTOMY N/A 04/16/2019   Procedure: EXPLORATORY LAPAROTOMY;  Surgeon: Benjamine Sprague, DO;  Location: [facility];  Service: General;  Laterality: N/A;   LOOP RECORDER INSERTION N/A 03/21/2018   Procedure: LOOP RECORDER INSERTION;  Surgeon: Sanda Klein, MD;  Location: [facility];  Service: Cardiovascular;  Laterality: N/A;   LUMBAR DISC SURGERY     ROUX-EN-Y GASTRIC BYPASS  2010   Dr Hassell Done   SMALL INTESTINE SURGERY     TONSILLECTOMY     UPPER GI ENDOSCOPY  01/12/2016   normal larynx, normal esophagus. gastric bypass with a normal-sized pouch and intact staple line, normal examined jejunum, otherwise exam was normal   VENTRAL HERNIA REPAIR N/A 08/21/2016   Procedure: LAPAROSCOPIC VENTRAL HERNIA;  Surgeon: Excell Seltzer, MD;  Location: [facility];  Service: General;  Laterality: N/A;   XI ROBOTIC ASSISTED VENTRAL HERNIA N/A 12/08/2019   Procedure: XI ROBOTIC ASSISTED VENTRAL HERNIA;  Surgeon: Jules Husbands, MD;  Location: [facility];  Service: General;  Laterality: N/A;    FAMILY HISTORY: Family History  Problem Relation Age of Onset   Cancer Father 57       Lung   Coronary artery disease Father 77   COPD Mother    Diabetes Mother    Hypertension Mother    Cancer Paternal Grandmother        Breast   Breast cancer Paternal Grandmother    Congestive Heart Failure Maternal Grandmother    Emphysema Maternal Grandfather    Heart attack Paternal Grandfather    Cancer Paternal Aunt        Breast   Breast cancer Paternal Aunt 24   Breast cancer Paternal Aunt 46    ADVANCED DIRECTIVES (Y/N):  N  HEALTH MAINTENANCE: Social History   Tobacco Use   Smoking status: Never   Smokeless  tobacco: Never  Vaping Use   Vaping Use: Never used  Substance Use Topics   Alcohol use: No   Drug use: No     Colonoscopy:  PAP:  Bone density:  Lipid panel:  Allergies  Allergen Reactions   Lotrel [Amlodipine Besy-Benazepril Hcl] Anaphylaxis   Contrast Media [Iodinated Contrast Media] Swelling and Other (See Comments)    Causes neck swelling    Niacin Hives   Sulfa Antibiotics Hives    Current Outpatient Medications  Medication Sig Dispense Refill   ACCU-CHEK GUIDE test strip USE TO CHECK BLOOD SUGAR UP TO 4 TIMES A DAY AS DIRECTED 100 strip 1   atorvastatin (LIPITOR) 40 MG tablet TAKE ONE TABLET BY MOUTH EVERY MORNING 90 tablet 0   blood glucose meter kit and supplies Dispense Accu-Chek Guide. Use up to four times daily as directed. 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 1 each 0   calcitRIOL (ROCALTROL) 0.25 MCG capsule Take 0.25 mcg by mouth daily.     copper tablet Take 1 tablet (2 mg total) by mouth daily.     cyanocobalamin (VITAMIN  B12) 500 MCG tablet Take 1 tablet (500 mcg total) by mouth daily.     diazoxide (PROGLYCEM) 50 MG/ML suspension Take 1 mL (50 mg total) by mouth every 8 (eight) hours. 30 mL 12   gabapentin (NEURONTIN) 300 MG capsule Take 1 capsule (300 mg total) by mouth 3 (three) times daily. 270 capsule 1   Glucagon (GVOKE HYPOPEN 2-PACK) 1 MG/0.2ML SOAJ Inject 0.2 mLs into the skin as needed. 0.2 mL 2   hydrOXYzine (VISTARIL) 100 MG capsule Take 1 capsule (100 mg total) by mouth 3 (three) times daily. 270 capsule 1   linaclotide (LINZESS) 290 MCG CAPS capsule TAKE 1 CAPSULE BY MOUTH EVERY DAY BEFORE BREAKFAST 90 capsule 1   metoprolol succinate (TOPROL-XL) 25 MG 24 hr tablet TAKE 1 TABLET (25 MG TOTAL) BY MOUTH DAILY. 90 tablet 1   mirtazapine (REMERON) 30 MG tablet Take 1 tablet (30 mg total) by mouth at bedtime. 30 tablet 1   omeprazole (PRILOSEC) 20 MG capsule Take 1 capsule (20 mg total) by mouth daily. 90 capsule 1   ondansetron (ZOFRAN) 4 MG tablet TAKE 1  TABLET BY MOUTH EVERY 8 HOURS AS NEEDED (Patient taking differently: Take 4 mg by mouth every 8 (eight) hours as needed for nausea or vomiting.) 30 tablet 2   polyethylene glycol powder (MIRALAX) 17 GM/SCOOP powder Take 1 scoop or 17 g 3 times a day for 2 days then use 1 scoop twice a day a day until you begin having soft bowel movements then cut back to 1 scoop a day. 255 g 0   rivastigmine (EXELON) 3 MG capsule Take 3 mg by mouth 2 (two) times daily.     traMADol (ULTRAM) 50 MG tablet Take 1 tablet (50 mg total) by mouth every 6 (six) hours as needed for moderate pain or severe pain. 100 tablet 3   zinc gluconate 50 MG tablet Take 1 tablet (50 mg total) by mouth daily.     No current facility-administered medications for this visit.    OBJECTIVE: Vitals:   11/02/22 1432  BP: 138/74  Pulse: 61  Temp: 97.8 F (36.6 C)     Body mass index is 22.32 kg/m.    ECOG FS:0 - Asymptomatic  General: Well-developed, well-nourished, no acute distress. Eyes: Pink conjunctiva, anicteric sclera. Lungs: Clear to auscultation bilaterally.  No audible wheezing or coughing Heart: Regular rate and rhythm.  Abdomen: Soft, nontender, nondistended.  Musculoskeletal: No edema, cyanosis, or clubbing. Neuro: Alert, answering all questions appropriately. Cranial nerves grossly intact. Skin: No rashes or petechiae noted. Psych: Normal affect.   LAB RESULTS:  Lab Results  Component Value Date   NA 142 08/31/2022   K 3.6 08/31/2022   CL 109 08/31/2022   CO2 26 08/31/2022   GLUCOSE 106 (H) 08/31/2022   BUN 16 08/31/2022   CREATININE 1.58 (H) 08/31/2022   CALCIUM 8.8 (L) 08/31/2022   PROT 6.2 (L) 08/31/2022   ALBUMIN 3.0 (L) 08/31/2022   AST 48 (H) 08/31/2022   ALT 47 (H) 08/31/2022   ALKPHOS 149 (H) 08/31/2022   BILITOT 0.4 08/31/2022   GFRNONAA 35 (L) 08/31/2022   GFRAA 42 (L) 12/05/2020    Lab Results  Component Value Date   WBC 6.3 11/01/2022   NEUTROABS 4.0 11/01/2022   HGB 11.3 (L)  11/01/2022   HCT 35.2 (L) 11/01/2022   MCV 85.9 11/01/2022   PLT 293 11/01/2022    Lab Results  Component Value Date   IRON 83 11/01/2022  TIBC 269 11/01/2022   IRONPCTSAT 31 11/01/2022   Lab Results  Component Value Date   FERRITIN 471 (H) 11/01/2022    STUDIES: No results found.   ASSESSMENT: Iron deficiency anemia.  PLAN:   1.  Iron deficiency anemia: Patient's hemoglobin remains decreased, but mildly improved to 11.3.  Iron stores continue to be within normal limits.  Previously, all of her other laboratory work including hemolysis labs, B12, folate, and SPEP were negative or within normal limits. Suspect anemia of chronic disease. Patient had a colonoscopy on September 05, 2018 that was reported as normal.  She does not require IV Venofer today.  Patient last received treatment on December 13, 2021.  Return to clinic in 4 months with repeat laboratory work, further evaluation, and continuation of treatment if needed.   2.  Abdominal pain: Resolved. 3.  Renal insufficiency: Mild, patient's most recent creatinine is 1.45.  Disposition:   No iron 4 mo- labs Day to week later- see me or finn, +/- venofer- la   Patient expressed understanding and was in agreement with this plan. She also understands that She can call clinic at any time with any questions, concerns, or complaints.   Verlon Au, NP   11/02/2022

## 2022-11-02 NOTE — Progress Notes (Signed)
Chronic Care Management Pharmacy Assistant   Name: NANNIE STARZYK  MRN: [id number] DOB: [birth year]  Reason for Encounter: Medication Review/Medication Coordination for [facility]   Recent office visits:  None ID  Recent consult visits:  None ID  Hospital visits:  None in previous 6 months  Medications: Outpatient Encounter Medications as of 11/02/2022  Medication Sig Note   ACCU-CHEK GUIDE test strip USE TO CHECK BLOOD SUGAR UP TO 4 TIMES A DAY AS DIRECTED    atorvastatin (LIPITOR) 40 MG tablet TAKE ONE TABLET BY MOUTH EVERY MORNING    blood glucose meter kit and supplies Dispense Accu-Chek Guide. Use up to four times daily as directed. 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    calcitRIOL (ROCALTROL) 0.25 MCG capsule Take 0.25 mcg by mouth daily.    copper tablet Take 1 tablet (2 mg total) by mouth daily.    cyanocobalamin (VITAMIN B12) 500 MCG tablet Take 1 tablet (500 mcg total) by mouth daily.    diazoxide (PROGLYCEM) 50 MG/ML suspension Take 1 mL (50 mg total) by mouth every 8 (eight) hours.    gabapentin (NEURONTIN) 300 MG capsule Take 1 capsule (300 mg total) by mouth 3 (three) times daily.    Glucagon (GVOKE HYPOPEN 2-PACK) 1 MG/0.2ML SOAJ Inject 0.2 mLs into the skin as needed.    hydrOXYzine (VISTARIL) 100 MG capsule Take 1 capsule (100 mg total) by mouth 3 (three) times daily.    linaclotide (LINZESS) 290 MCG CAPS capsule TAKE 1 CAPSULE BY MOUTH EVERY DAY BEFORE BREAKFAST    metoprolol succinate (TOPROL-XL) 25 MG 24 hr tablet TAKE 1 TABLET (25 MG TOTAL) BY MOUTH DAILY.    mirtazapine (REMERON) 30 MG tablet Take 1 tablet (30 mg total) by mouth at bedtime.    omeprazole (PRILOSEC) 20 MG capsule Take 1 capsule (20 mg total) by mouth daily.    ondansetron (ZOFRAN) 4 MG tablet TAKE 1 TABLET BY MOUTH EVERY 8 HOURS AS NEEDED (Patient taking differently: Take 4 mg by mouth every 8 (eight) hours as needed for nausea or vomiting.)    polyethylene glycol powder (MIRALAX) 17  GM/SCOOP powder Take 1 scoop or 17 g 3 times a day for 2 days then use 1 scoop twice a day a day until you begin having soft bowel movements then cut back to 1 scoop a day.    rivastigmine (EXELON) 3 MG capsule Take 3 mg by mouth 2 (two) times daily. 09/10/2022: Prescribed by Dr. [name]   traMADol (ULTRAM) 50 MG tablet Take 1 tablet (50 mg total) by mouth every 6 (six) hours as needed for moderate pain or severe pain.    zinc gluconate 50 MG tablet Take 1 tablet (50 mg total) by mouth daily.    No facility-administered encounter medications on file as of 11/02/2022.   Care Gaps: Diabetic Eye Exam Diabetic Foot Exam Influenza Vaccine  Star Rating Drugs: Atorvastatin 40 mg last filled on 10/17/2022 for a 30-Day supply with [facility]   BP Readings from Last 3 Encounters:  08/31/22 133/67  08/30/22 (!) 166/76  08/21/22 120/84    Lab Results  Component Value Date   HGBA1C 5.4 08/09/2022    Patient obtains medications through Vials  30 Days   Last adherence delivery included: Atorvastatin 40 mg 1 tablet daily- (Breakfast) Mirtazapine 15 mg 1 tablet daily -(Bedtime) Gabapentin 300 mg 1 capsule three times daily-(Breakfast, Lunch, Bedtime) Hydroxyzine 100 mg 1 capsule three times daily-(Breakfast, Lunch, Bedtime) Linzess 290 mcg caps 1  cap daily (Before Breakfast) Rivastigmine 3 mg twice daily- (Breakfast, Bedtime) Omeprazole 20 mg  1 capsule by mouth daily -(Breakfast)  Patient declined the following medications for the month of November: No medications were declined for this month  Patient is due for next adherence delivery on: 11/19/2022 2nd Route.  Called patient and reviewed medications and coordinated delivery.  This delivery to include: Atorvastatin 40 mg 1 tablet daily- (Breakfast) Mirtazapine 15 mg 1 tablet daily -(Bedtime) Gabapentin 300 mg 1 capsule three times daily-(Breakfast, Lunch, Bedtime) Hydroxyzine 100 mg 1 capsule three times daily-(Breakfast, Lunch,  Bedtime) Linzess 290 mcg caps 1 cap daily (Before Breakfast) Rivastigmine 3 mg twice daily- (Breakfast, Bedtime) Omeprazole 20 mg  1 capsule by mouth daily -(Breakfast)  Patient declined the following medications for the month of December: No medications were declined  Patient needs refills for the month of December: No refills needed for this delivery.  Confirmed delivery date of 11/19/2022 2nd Route, advised patient that pharmacy will contact them the morning of delivery.  Patient has a follow-up telephone appointment on 11/27/2022 @ 1515.  Lynann Bologna, CPA/CMA Clinical Pharmacist Assistant Phone: [phone]

## 2022-11-06 ENCOUNTER — Other Ambulatory Visit: Payer: Self-pay | Admitting: Family Medicine

## 2022-11-12 NOTE — Progress Notes (Deleted)
Established patient visit   Patient: Julie Acevedo   DOB: 02-24-1951   71 y.o. Female  MRN: [id number] Visit Date: 11/13/2022  Today's healthcare provider: Eulis Foster, MD   No chief complaint on file.  Subjective    HPI  Hypertension, follow-up  BP Readings from Last 3 Encounters:  11/02/22 138/74  08/31/22 133/67  08/30/22 (!) 166/76   Wt Readings from Last 3 Encounters:  11/02/22 126 lb (57.2 kg)  08/31/22 123 lb (55.8 kg)  08/30/22 123 lb (55.8 kg)     She was last seen for hypertension {NUMBERS 1-12:18279} {days/wks/mos/yrs:310907} ago.  BP at that visit was ***. Management since that visit includes ***.  She reports {excellent/good/fair/poor:19665} compliance with treatment. She {is/is not:9024} having side effects. {document side effects if present:1} She is following a {diet:21022986} diet. She {is/is not:9024} exercising. She {does/does not:200015} smoke.  Use of agents associated with hypertension: {bp agents assoc with hypertension:511::"none"}.   Outside blood pressures are {***enter patient reported home BP readings, or 'not being checked':1}. Symptoms: {Yes/No:20286} chest pain {Yes/No:20286} chest pressure  {Yes/No:20286} palpitations {Yes/No:20286} syncope  {Yes/No:20286} dyspnea {Yes/No:20286} orthopnea  {Yes/No:20286} paroxysmal nocturnal dyspnea {Yes/No:20286} lower extremity edema   Pertinent labs Lab Results  Component Value Date   CHOL 134 09/06/2021   HDL 60 09/06/2021   LDLCALC 63 09/06/2021   TRIG 54 09/06/2021   CHOLHDL 2.2 09/06/2021   Lab Results  Component Value Date   NA 142 08/31/2022   K 3.6 08/31/2022   CREATININE 1.58 (H) 08/31/2022   GFRNONAA 35 (L) 08/31/2022   GLUCOSE 106 (H) 08/31/2022   TSH 3.012 08/08/2022     The ASCVD Risk score (Arnett DK, et al., 2019) failed to calculate for the following reasons:   The patient has a prior MI or stroke  diagnosis  ---------------------------------------------------------------------------------------------------   Medications: Outpatient Medications Prior to Visit  Medication Sig   ACCU-CHEK GUIDE test strip USE TO CHECK BLOOD SUGAR UP TO 4 TIMES A DAY AS DIRECTED   atorvastatin (LIPITOR) 40 MG tablet TAKE ONE TABLET BY MOUTH EVERY MORNING   blood glucose meter kit and supplies Dispense Accu-Chek Guide. Use up to four times daily as directed. 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   calcitRIOL (ROCALTROL) 0.25 MCG capsule Take 0.25 mcg by mouth daily.   copper tablet Take 1 tablet (2 mg total) by mouth daily.   cyanocobalamin (VITAMIN B12) 500 MCG tablet Take 1 tablet (500 mcg total) by mouth daily.   diazoxide (PROGLYCEM) 50 MG/ML suspension Take 1 mL (50 mg total) by mouth every 8 (eight) hours.   gabapentin (NEURONTIN) 300 MG capsule Take 1 capsule (300 mg total) by mouth 3 (three) times daily.   Glucagon (GVOKE HYPOPEN 2-PACK) 1 MG/0.2ML SOAJ Inject 0.2 mLs into the skin as needed.   hydrOXYzine (VISTARIL) 100 MG capsule Take 1 capsule (100 mg total) by mouth 3 (three) times daily.   linaclotide (LINZESS) 290 MCG CAPS capsule TAKE 1 CAPSULE BY MOUTH EVERY DAY BEFORE BREAKFAST   metoprolol succinate (TOPROL-XL) 25 MG 24 hr tablet TAKE 1 TABLET (25 MG TOTAL) BY MOUTH DAILY.   mirtazapine (REMERON) 30 MG tablet TAKE ONE TABLET BY MOUTH EVERYDAY AT BEDTIME   omeprazole (PRILOSEC) 20 MG capsule Take 1 capsule (20 mg total) by mouth daily.   ondansetron (ZOFRAN) 4 MG tablet TAKE 1 TABLET BY MOUTH EVERY 8 HOURS AS NEEDED (Patient taking differently: Take 4 mg by mouth every 8 (eight) hours as needed  for nausea or vomiting.)   polyethylene glycol powder (MIRALAX) 17 GM/SCOOP powder Take 1 scoop or 17 g 3 times a day for 2 days then use 1 scoop twice a day a day until you begin having soft bowel movements then cut back to 1 scoop a day.   rivastigmine (EXELON) 3 MG capsule Take 3 mg by mouth 2 (two)  times daily.   traMADol (ULTRAM) 50 MG tablet Take 1 tablet (50 mg total) by mouth every 6 (six) hours as needed for moderate pain or severe pain.   zinc gluconate 50 MG tablet Take 1 tablet (50 mg total) by mouth daily.   No facility-administered medications prior to visit.    Review of Systems  {Labs  Heme  Chem  Endocrine  Serology  Results Review (optional):23779}   Objective    There were no vitals taken for this visit. {Show previous vital signs (optional):23777}  Physical Exam  ***  No results found for any visits on 11/13/22.  Assessment & Plan     ***  No follow-ups on file.      {provider attestation***:1}   Eulis Foster, MD  [facility] [phone] (phone) [phone] (fax)  [place]

## 2022-11-13 ENCOUNTER — Ambulatory Visit: Payer: Medicare Other | Admitting: Family Medicine

## 2022-11-14 ENCOUNTER — Ambulatory Visit: Payer: Medicare Other | Admitting: Family Medicine

## 2022-11-14 NOTE — Progress Notes (Signed)
Established patient visit   Patient: Julie Acevedo   DOB: [birth year]   71 y.o. Female  MRN: [id number] Visit Date: 11/15/2022  Today's healthcare provider: Mardene Speak, PA-C   CC: fu for her chronic conditions  Subjective    HPI   Pt reports having no complains Patient reports Dr.Gilbert told her to come back in 5 months when she had an appointment in June.  Per chart review , Dr. Rosanna Randy saw pt in September of 2023 for hospital fu. Per chart review from 11/02/2022, patient was seen by oncology for iron deficiency anemia.  Her hemoglobin level was 11.3, iron stores were within normal limits, hemolysis labs/B12 /folate/SPEP were negative. Was not required IV Venofer and was scheduled for FU in 4 mo. Mild renal insufficiency: From 11/02/2022 was 1.45  Medications: Outpatient Medications Prior to Visit  Medication Sig   ACCU-CHEK GUIDE test strip USE TO CHECK BLOOD SUGAR UP TO 4 TIMES A DAY AS DIRECTED   atorvastatin (LIPITOR) 40 MG tablet TAKE ONE TABLET BY MOUTH EVERY MORNING   blood glucose meter kit and supplies Dispense Accu-Chek Guide. Use up to four times daily as directed. 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   calcitRIOL (ROCALTROL) 0.25 MCG capsule Take 0.25 mcg by mouth daily.   copper tablet Take 1 tablet (2 mg total) by mouth daily.   cyanocobalamin (VITAMIN B12) 500 MCG tablet Take 1 tablet (500 mcg total) by mouth daily.   diazoxide (PROGLYCEM) 50 MG/ML suspension Take 1 mL (50 mg total) by mouth every 8 (eight) hours.   gabapentin (NEURONTIN) 300 MG capsule Take 1 capsule (300 mg total) by mouth 3 (three) times daily.   Glucagon (GVOKE HYPOPEN 2-PACK) 1 MG/0.2ML SOAJ Inject 0.2 mLs into the skin as needed.   hydrOXYzine (VISTARIL) 100 MG capsule Take 1 capsule (100 mg total) by mouth 3 (three) times daily.   linaclotide (LINZESS) 290 MCG CAPS capsule TAKE 1 CAPSULE BY MOUTH EVERY DAY BEFORE BREAKFAST   metoprolol succinate (TOPROL-XL) 25 MG 24 hr tablet TAKE 1 TABLET  (25 MG TOTAL) BY MOUTH DAILY.   mirtazapine (REMERON) 30 MG tablet TAKE ONE TABLET BY MOUTH EVERYDAY AT BEDTIME   omeprazole (PRILOSEC) 20 MG capsule Take 1 capsule (20 mg total) by mouth daily.   ondansetron (ZOFRAN) 4 MG tablet TAKE 1 TABLET BY MOUTH EVERY 8 HOURS AS NEEDED (Patient taking differently: Take 4 mg by mouth every 8 (eight) hours as needed for nausea or vomiting.)   polyethylene glycol powder (MIRALAX) 17 GM/SCOOP powder Take 1 scoop or 17 g 3 times a day for 2 days then use 1 scoop twice a day a day until you begin having soft bowel movements then cut back to 1 scoop a day.   rivastigmine (EXELON) 3 MG capsule Take 3 mg by mouth 2 (two) times daily.   traMADol (ULTRAM) 50 MG tablet Take 1 tablet (50 mg total) by mouth every 6 (six) hours as needed for moderate pain or severe pain.   zinc gluconate 50 MG tablet Take 1 tablet (50 mg total) by mouth daily.   No facility-administered medications prior to visit.    Review of Systems  All other systems reviewed and are negative. Except see HPi     Objective    There were no vitals taken for this visit. BP Readings from Last 3 Encounters:  11/02/22 138/74  08/31/22 133/67  08/30/22 (!) 166/76      Physical Exam Vitals reviewed.  Constitutional:      General: She is not in acute distress.    Appearance: Normal appearance. She is well-developed. She is not diaphoretic.  HENT:     Head: Normocephalic and atraumatic.  Eyes:     General: No scleral icterus.    Conjunctiva/sclera: Conjunctivae normal.  Neck:     Thyroid: No thyromegaly.  Cardiovascular:     Rate and Rhythm: Normal rate and regular rhythm.     Pulses: Normal pulses.     Heart sounds: Normal heart sounds. No murmur heard. Pulmonary:     Effort: Pulmonary effort is normal. No respiratory distress.     Breath sounds: Normal breath sounds. No wheezing, rhonchi or rales.  Musculoskeletal:     Cervical back: Neck supple.     Right lower leg: No edema.      Left lower leg: No edema.  Lymphadenopathy:     Cervical: No cervical adenopathy.  Skin:    General: Skin is warm and dry.     Findings: No rash.  Neurological:     Mental Status: She is alert and oriented to person, place, and time. Mental status is at baseline.  Psychiatric:        Mood and Affect: Mood normal.        Behavior: Behavior normal.   Sensation testing: intact to touch and monofilament testing bilaterally.   No results found for any visits on 11/15/22.  Assessment & Plan     Type 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, without long-term current use of insulin (HCC) Chronic and stable Pt was d/c all her DM meds by PCP after hypoglycemic episode - POCT HgB A1C 3 mo ago was 5.4 , BS was 111 on 08/30/22 Pt was advised to continue lifestyle modification/diet and exercise as tolerated Will follow-up  Need for immunization against influenza  - Flu Vaccine QUAD High Dose(Fluad)  COVID-19 vaccine administered  - PFIZER Comirnaty(GRAY TOP)COVID-19 Vaccine  Hypertension Chronic and stable Advised to continue her current regimen Advised to continue lifestyle modification and low-salt diet and exercise as tolerated Will follow-up  Mixed hyperlipidemia Chronic and stable Last lipids from a year ago were within normal limits Continue atorvastatin 40 mg daily Will follow-up  Stage IIIb chronic kidney disease Chronic and stable From 09/27/2022 creatinine 1.45 eGFR 39 She needs to drink 8-12 glasses of water every day, avoid NSAIDs, and have renal functions check avery 6-12 months to ensure stability.      Follow-up as scheduled     The patient was advised to call back or seek an in-person evaluation if the symptoms worsen or if the condition fails to improve as anticipated.  I discussed the assessment and treatment plan with the patient. The patient was provided an opportunity to ask questions and all were answered. The patient agreed with the  plan and demonstrated an understanding of the instructions.  The entirety of the information documented in the History of Present Illness, Review of Systems and Physical Exam were personally obtained by me. Portions of this information were initially documented by the CMA and reviewed by me for thoroughness and accuracy.   Mardene Speak, [facility], [place] [phone] (phone) [phone] (fax)

## 2022-11-15 ENCOUNTER — Encounter: Payer: Self-pay | Admitting: Physician Assistant

## 2022-11-15 ENCOUNTER — Ambulatory Visit: Payer: Medicare Other | Admitting: Physician Assistant

## 2022-11-15 DIAGNOSIS — E1122 Type 2 diabetes mellitus with diabetic chronic kidney disease: Secondary | ICD-10-CM

## 2022-11-15 DIAGNOSIS — I10 Essential (primary) hypertension: Secondary | ICD-10-CM

## 2022-11-15 DIAGNOSIS — Z23 Encounter for immunization: Secondary | ICD-10-CM

## 2022-11-15 DIAGNOSIS — N1832 Chronic kidney disease, stage 3b: Secondary | ICD-10-CM

## 2022-11-15 DIAGNOSIS — E782 Mixed hyperlipidemia: Secondary | ICD-10-CM

## 2022-11-19 ENCOUNTER — Inpatient Hospital Stay (HOSPITAL_COMMUNITY)
Admission: EM | Admit: 2022-11-19 | Discharge: 2022-11-21 | DRG: 101 | Disposition: A | Discharge status: Home / Self Care | Payer: Medicare Other | Attending: Internal Medicine | Admitting: Internal Medicine

## 2022-11-19 ENCOUNTER — Emergency Department (HOSPITAL_COMMUNITY): Payer: Medicare Other

## 2022-11-19 ENCOUNTER — Other Ambulatory Visit: Payer: Self-pay

## 2022-11-19 ENCOUNTER — Encounter (HOSPITAL_COMMUNITY): Payer: Self-pay | Admitting: *Deleted

## 2022-11-19 DIAGNOSIS — N1832 Chronic kidney disease, stage 3b: Secondary | ICD-10-CM | POA: Diagnosis present

## 2022-11-19 DIAGNOSIS — Z79899 Other long term (current) drug therapy: Secondary | ICD-10-CM

## 2022-11-19 DIAGNOSIS — Z9884 Bariatric surgery status: Secondary | ICD-10-CM | POA: Diagnosis not present

## 2022-11-19 DIAGNOSIS — I10 Essential (primary) hypertension: Secondary | ICD-10-CM | POA: Diagnosis present

## 2022-11-19 DIAGNOSIS — E440 Moderate protein-calorie malnutrition: Secondary | ICD-10-CM | POA: Diagnosis present

## 2022-11-19 DIAGNOSIS — G3183 Dementia with Lewy bodies: Secondary | ICD-10-CM | POA: Diagnosis present

## 2022-11-19 DIAGNOSIS — S0083XA Contusion of other part of head, initial encounter: Secondary | ICD-10-CM | POA: Diagnosis present

## 2022-11-19 DIAGNOSIS — Z825 Family history of asthma and other chronic lower respiratory diseases: Secondary | ICD-10-CM | POA: Diagnosis not present

## 2022-11-19 DIAGNOSIS — I252 Old myocardial infarction: Secondary | ICD-10-CM | POA: Diagnosis not present

## 2022-11-19 DIAGNOSIS — R569 Unspecified convulsions: Secondary | ICD-10-CM | POA: Diagnosis present

## 2022-11-19 DIAGNOSIS — Z833 Family history of diabetes mellitus: Secondary | ICD-10-CM

## 2022-11-19 DIAGNOSIS — Z8249 Family history of ischemic heart disease and other diseases of the circulatory system: Secondary | ICD-10-CM

## 2022-11-19 DIAGNOSIS — E1122 Type 2 diabetes mellitus with diabetic chronic kidney disease: Secondary | ICD-10-CM | POA: Diagnosis present

## 2022-11-19 DIAGNOSIS — M1990 Unspecified osteoarthritis, unspecified site: Secondary | ICD-10-CM | POA: Diagnosis present

## 2022-11-19 DIAGNOSIS — Z8614 Personal history of Methicillin resistant Staphylococcus aureus infection: Secondary | ICD-10-CM

## 2022-11-19 DIAGNOSIS — F0280 Dementia in other diseases classified elsewhere without behavioral disturbance: Secondary | ICD-10-CM | POA: Diagnosis present

## 2022-11-19 DIAGNOSIS — Z882 Allergy status to sulfonamides status: Secondary | ICD-10-CM

## 2022-11-19 DIAGNOSIS — W19XXXA Unspecified fall, initial encounter: Principal | ICD-10-CM

## 2022-11-19 DIAGNOSIS — Z888 Allergy status to other drugs, medicaments and biological substances status: Secondary | ICD-10-CM | POA: Diagnosis not present

## 2022-11-19 DIAGNOSIS — Y92012 Bathroom of single-family (private) house as the place of occurrence of the external cause: Secondary | ICD-10-CM

## 2022-11-19 DIAGNOSIS — G40101 Localization-related (focal) (partial) symptomatic epilepsy and epileptic syndromes with simple partial seizures, not intractable, with status epilepticus: Principal | ICD-10-CM | POA: Diagnosis present

## 2022-11-19 DIAGNOSIS — W2209XA Striking against other stationary object, initial encounter: Secondary | ICD-10-CM | POA: Diagnosis present

## 2022-11-19 DIAGNOSIS — Z9049 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: Secondary | ICD-10-CM | POA: Diagnosis not present

## 2022-11-19 DIAGNOSIS — F02A0 Dementia in other diseases classified elsewhere, mild, without behavioral disturbance, psychotic disturbance, mood disturbance, and anxiety: Secondary | ICD-10-CM | POA: Diagnosis present

## 2022-11-19 DIAGNOSIS — M25512 Pain in left shoulder: Secondary | ICD-10-CM | POA: Diagnosis present

## 2022-11-19 DIAGNOSIS — Z8616 Personal history of COVID-19: Secondary | ICD-10-CM

## 2022-11-19 DIAGNOSIS — Z803 Family history of malignant neoplasm of breast: Secondary | ICD-10-CM

## 2022-11-19 DIAGNOSIS — E785 Hyperlipidemia, unspecified: Secondary | ICD-10-CM | POA: Diagnosis present

## 2022-11-19 DIAGNOSIS — Z91041 Radiographic dye allergy status: Secondary | ICD-10-CM | POA: Diagnosis not present

## 2022-11-19 DIAGNOSIS — Z90710 Acquired absence of both cervix and uterus: Secondary | ICD-10-CM

## 2022-11-19 DIAGNOSIS — Z6822 Body mass index (BMI) 22.0-22.9, adult: Secondary | ICD-10-CM

## 2022-11-19 DIAGNOSIS — G40001 Localization-related (focal) (partial) idiopathic epilepsy and epileptic syndromes with seizures of localized onset, not intractable, with status epilepticus: Secondary | ICD-10-CM | POA: Diagnosis not present

## 2022-11-19 DIAGNOSIS — Y9389 Activity, other specified: Secondary | ICD-10-CM

## 2022-11-19 DIAGNOSIS — E119 Type 2 diabetes mellitus without complications: Secondary | ICD-10-CM

## 2022-11-19 DIAGNOSIS — I129 Hypertensive chronic kidney disease with stage 1 through stage 4 chronic kidney disease, or unspecified chronic kidney disease: Secondary | ICD-10-CM | POA: Diagnosis present

## 2022-11-19 LAB — CBG MONITORING, ED
Glucose-Capillary: 65 mg/dL — ABNORMAL LOW (ref 70–99)
Glucose-Capillary: 75 mg/dL (ref 70–99)
Glucose-Capillary: 70 mg/dL (ref 70–99)
Glucose-Capillary: 61 mg/dL — ABNORMAL LOW (ref 70–99)
Glucose-Capillary: 51 mg/dL — ABNORMAL LOW (ref 70–99)
Glucose-Capillary: 146 mg/dL — ABNORMAL HIGH (ref 70–99)

## 2022-11-19 LAB — COMPREHENSIVE METABOLIC PANEL
ALT: 36 U/L (ref 0–44)
AST: 37 U/L (ref 15–41)
Albumin: 3.5 g/dL (ref 3.5–5.0)
Alkaline Phosphatase: 151 U/L — ABNORMAL HIGH (ref 38–126)
Anion gap: 6 (ref 5–15)
BUN: 10 mg/dL (ref 8–23)
CO2: 27 mmol/L (ref 22–32)
Calcium: 8.9 mg/dL (ref 8.9–10.3)
Chloride: 108 mmol/L (ref 98–111)
Creatinine, Ser: 1.65 mg/dL — ABNORMAL HIGH (ref 0.44–1.00)
GFR, Estimated: 33 mL/min — ABNORMAL LOW (ref >60)
Glucose, Bld: 85 mg/dL (ref 70–99)
Potassium: 3.6 mmol/L (ref 3.5–5.1)
Sodium: 141 mmol/L (ref 135–145)
Total Bilirubin: 0.1 mg/dL — ABNORMAL LOW (ref 0.3–1.2)
Total Protein: 6.7 g/dL (ref 6.5–8.1)

## 2022-11-19 LAB — ETHANOL: Alcohol, Ethyl (B): <10 mg/dL (ref <10)

## 2022-11-19 LAB — I-STAT CHEM 8, ED
BUN: 11 mg/dL (ref 8–23)
Calcium, Ion: 1.16 mmol/L (ref 1.15–1.40)
Chloride: 107 mmol/L (ref 98–111)
Creatinine, Ser: 1.5 mg/dL — ABNORMAL HIGH (ref 0.44–1.00)
Glucose, Bld: 83 mg/dL (ref 70–99)
HCT: 34 % — ABNORMAL LOW (ref 36.0–46.0)
Hemoglobin: 11.6 g/dL — ABNORMAL LOW (ref 12.0–15.0)
Potassium: 3.7 mmol/L (ref 3.5–5.1)
Sodium: 144 mmol/L (ref 135–145)
TCO2: 27 mmol/L (ref 22–32)

## 2022-11-19 LAB — PROTIME-INR
INR: 1.1 (ref 0.8–1.2)
Prothrombin Time: 13.8 seconds (ref 11.4–15.2)

## 2022-11-19 LAB — CBC
HCT: 33.8 % — ABNORMAL LOW (ref 36.0–46.0)
Hemoglobin: 10.8 g/dL — ABNORMAL LOW (ref 12.0–15.0)
MCH: 27.3 pg (ref 26.0–34.0)
MCHC: 32 g/dL (ref 30.0–36.0)
MCV: 85.4 fL (ref 80.0–100.0)
Platelets: 247 10*3/uL (ref 150–400)
RBC: 3.96 MIL/uL (ref 3.87–5.11)
RDW: 13.3 % (ref 11.5–15.5)
WBC: 5.5 10*3/uL (ref 4.0–10.5)
nRBC: 0 % (ref 0.0–0.2)

## 2022-11-19 MED ORDER — LORAZEPAM 2 MG/ML IJ SOLN: 1.0000 mg | INTRAMUSCULAR | Status: DC | PRN

## 2022-11-19 MED ORDER — DEXTROSE-NACL 5-0.45 % IV SOLN: INTRAVENOUS | Status: DC

## 2022-11-19 MED ORDER — ACETAMINOPHEN 325 MG PO TABS
650.0000 mg | ORAL_TABLET | Freq: Four times a day (QID) | ORAL | Status: DC | PRN
  Administered 2022-11-21: 650 mg via ORAL
  Filled 2022-11-19: qty 2

## 2022-11-19 MED ORDER — DEXTROSE 50 % IV SOLN
INTRAVENOUS | Status: AC
  Filled 2022-11-19: qty 50

## 2022-11-19 MED ORDER — SODIUM CHLORIDE 0.9 % IV SOLN
3500.0000 mg | Freq: Once | INTRAVENOUS | Status: DC
  Filled 2022-11-19: qty 35

## 2022-11-19 MED ORDER — LORAZEPAM 2 MG/ML IJ SOLN
INTRAMUSCULAR | Status: AC
  Administered 2022-11-19: 2 mg
  Filled 2022-11-19: qty 1

## 2022-11-19 MED ORDER — GLUCOSE 40 % PO GEL
ORAL | Status: AC
  Filled 2022-11-19: qty 1

## 2022-11-19 MED ORDER — ATORVASTATIN CALCIUM 40 MG PO TABS
40.0000 mg | ORAL_TABLET | Freq: Every morning | ORAL | Status: DC
  Administered 2022-11-20 – 2022-11-21 (×2): 40 mg via ORAL
  Filled 2022-11-19 (×2): qty 1

## 2022-11-19 MED ORDER — GABAPENTIN 300 MG PO CAPS
300.0000 mg | ORAL_CAPSULE | Freq: Three times a day (TID) | ORAL | Status: DC
  Administered 2022-11-19 – 2022-11-21 (×6): 300 mg via ORAL
  Filled 2022-11-19 (×6): qty 1

## 2022-11-19 MED ORDER — MIRTAZAPINE 15 MG PO TABS
30.0000 mg | ORAL_TABLET | Freq: Every day | ORAL | Status: DC
  Administered 2022-11-20: 30 mg via ORAL
  Filled 2022-11-19: qty 2

## 2022-11-19 MED ORDER — ACETAMINOPHEN 650 MG RE SUPP: 650.0000 mg | Freq: Four times a day (QID) | RECTAL | Status: DC | PRN

## 2022-11-19 MED ORDER — DEXTROSE 50 % IV SOLN
1.0000 | INTRAVENOUS | Status: DC | PRN
  Administered 2022-11-20: 50 mL via INTRAVENOUS
  Filled 2022-11-19: qty 50

## 2022-11-19 MED ORDER — DEXTROSE 50 % IV SOLN
25.0000 mL | Freq: Once | INTRAVENOUS | Status: AC
  Administered 2022-11-19: 25 mL via INTRAVENOUS
  Filled 2022-11-19: qty 50

## 2022-11-19 MED ORDER — HEPARIN SODIUM (PORCINE) 5000 UNIT/ML IJ SOLN
5000.0000 [IU] | Freq: Three times a day (TID) | INTRAMUSCULAR | Status: DC
  Administered 2022-11-19 – 2022-11-21 (×7): 5000 [IU] via SUBCUTANEOUS
  Filled 2022-11-19 (×6): qty 1

## 2022-11-19 MED ORDER — LEVETIRACETAM IN NACL 1500 MG/100ML IV SOLN
1500.0000 mg | Freq: Once | INTRAVENOUS | Status: AC
  Administered 2022-11-19: 1500 mg via INTRAVENOUS
  Filled 2022-11-19: qty 100

## 2022-11-19 MED ORDER — METOPROLOL SUCCINATE ER 25 MG PO TB24
25.0000 mg | ORAL_TABLET | Freq: Every day | ORAL | Status: DC
  Administered 2022-11-19: 25 mg via ORAL
  Filled 2022-11-19: qty 1

## 2022-11-19 MED ORDER — DIAZOXIDE 50 MG/ML PO SUSP: 3.0000 mg/kg/d | Freq: Three times a day (TID) | ORAL | Status: DC

## 2022-11-19 MED ORDER — LEVETIRACETAM IN NACL 1000 MG/100ML IV SOLN
1000.0000 mg | Freq: Once | INTRAVENOUS | Status: DC
  Filled 2022-11-19: qty 100

## 2022-11-19 MED ORDER — LEVETIRACETAM IN NACL 500 MG/100ML IV SOLN: 500.0000 mg | Freq: Two times a day (BID) | INTRAVENOUS | Status: DC

## 2022-11-19 MED ORDER — LEVETIRACETAM IN NACL 1000 MG/100ML IV SOLN
INTRAVENOUS | Status: AC
  Administered 2022-11-19: 1000 mg
  Filled 2022-11-19: qty 100

## 2022-11-19 MED ORDER — DEXTROSE 50 % IV SOLN
1.0000 | Freq: Once | INTRAVENOUS | Status: AC
  Administered 2022-11-19: 50 mL via INTRAVENOUS

## 2022-11-19 MED ORDER — LEVETIRACETAM IN NACL 1000 MG/100ML IV SOLN
1000.0000 mg | Freq: Once | INTRAVENOUS | Status: AC
  Administered 2022-11-19: 1000 mg via INTRAVENOUS

## 2022-11-19 MED ORDER — VITAMIN B-12 100 MCG PO TABS
500.0000 ug | ORAL_TABLET | Freq: Every day | ORAL | Status: DC
  Administered 2022-11-19 – 2022-11-21 (×3): 500 ug via ORAL
  Filled 2022-11-19 (×3): qty 5

## 2022-11-19 MED ORDER — LORAZEPAM 2 MG/ML IJ SOLN
INTRAMUSCULAR | Status: AC
  Filled 2022-11-19: qty 1

## 2022-11-19 MED ORDER — PANTOPRAZOLE SODIUM 40 MG PO TBEC
40.0000 mg | DELAYED_RELEASE_TABLET | Freq: Every day | ORAL | Status: DC
  Administered 2022-11-19 – 2022-11-21 (×3): 40 mg via ORAL
  Filled 2022-11-19 (×3): qty 1

## 2022-11-19 NOTE — Procedures (Signed)
[facility]  Routine EEG Report  Video Performed: Performed  Demographics: Patient Name:   Julie Acevedo, Julie Acevedo  Date of Birth:   September 21, 1951  Identification Number:   MRN - [id number]  Study Times:  Study Start Time:   11/19/2022 13:16:00 Study End Time:   11/19/2022 13:53:00  Indication(s): Spells, Eval for Seizures  Medication:  Keppra, gabapentin  Technical Summary:  This EEG was performed utilizing standard International 10-20 System of electrode placement. One channel electrocardiogram was monitored. Data were obtained, stored, and interpreted utilizing referential montage recording, with reformatting to longitudinal, transverse bipolar, and referential montages as necessary for interpretation.  State(s):       Awake      Drowsy      Asleep   Activation Procedures: Hyperventilation: Not performed Photic Stimulation: Not performed   EEG Description:  Background showed a reduction of 10 Hz posterior dominant alpha rhythm. The posterior dominant alpha rhythm is non-sustained and intermixed with diffuse fast activity and theta slow 6-7 Hz.  The amplitudes were 20--30 uV.  There was no normal sleep structures observed.  There was no clinical event noted.  Throughout the record, there was no epileptiform abnormality or lateralizing sign observed.  Intermittent myogenic and movement artifacts were noted.  Impression:  This is an abnormal EEG indicative of a mild to moderate diffuse encephalopathy. This is non specific in etiology and could be from benzodiazepine effect (Ativan).  No epileptiform abnormality, electrographic seizures or evidence of status epilepticus were present. No lateralizing sign is observed.   Dr Spero Curb    [facility] For Inpatient follow-up with [facility] physician please call [facility] [phone]. This is not an outpatient service. Post hospital discharge, please contact  hospital directly.

## 2022-11-19 NOTE — ED Provider Notes (Signed)
[facility] EMERGENCY DEPARTMENT Provider Note   CSN: [id number] Arrival date & time: 11/19/22  1154     History  Chief Complaint  Patient presents with   Julie Acevedo is a 71 y.o. female.  With past medical history significant for anemia, DMT2, CKD, [name], presenting as a level 2 trauma activation after hitting her head earlier today.  Patient arrived via EMS.  EMS reports that family described patient leaning over while cleaning the bathroom and hitting her head on a ceramic towel bar in the bathroom.  They report that after this patient seemed confused.  She did not lose consciousness.  Per report, patient did not fall.  The patient is endorsing left shoulder pain and head pain.  She does not recall the event.  It is unknown if the patient is on anticoagulants.       Home Medications Prior to Admission medications   Medication Sig Start Date End Date Taking? Authorizing Provider  ACCU-CHEK GUIDE test strip USE TO CHECK BLOOD SUGAR UP TO 4 TIMES A DAY AS DIRECTED 10/03/22   Brita Romp, Dionne Bucy, MD  atorvastatin (LIPITOR) 40 MG tablet TAKE ONE TABLET BY MOUTH EVERY MORNING 09/12/22   Jerrol Banana., MD  blood glucose meter kit and supplies Dispense Accu-Chek Guide. Use up to four times daily as directed. 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 08/16/22   Jerrol Banana., MD  calcitRIOL (ROCALTROL) 0.25 MCG capsule Take 0.25 mcg by mouth daily.    [provider]  copper tablet Take 1 tablet (2 mg total) by mouth daily. 08/14/22   Elgergawy, Silver Huguenin, MD  cyanocobalamin (VITAMIN B12) 500 MCG tablet Take 1 tablet (500 mcg total) by mouth daily. 08/14/22   Elgergawy, Silver Huguenin, MD  diazoxide (PROGLYCEM) 50 MG/ML suspension Take 1 mL (50 mg total) by mouth every 8 (eight) hours. 08/14/22   Elgergawy, Silver Huguenin, MD  gabapentin (NEURONTIN) 300 MG capsule Take 1 capsule (300 mg total) by mouth 3 (three) times daily. 07/02/22   Jerrol Banana., MD  Glucagon (GVOKE HYPOPEN 2-PACK) 1 MG/0.2ML SOAJ Inject 0.2 mLs into the skin as needed. 08/21/22   Jerrol Banana., MD  hydrOXYzine (VISTARIL) 100 MG capsule Take 1 capsule (100 mg total) by mouth 3 (three) times daily. 07/02/22   Jerrol Banana., MD  linaclotide [facility]) 290 MCG CAPS capsule TAKE 1 CAPSULE BY MOUTH EVERY DAY BEFORE BREAKFAST 06/25/22   Jerrol Banana., MD  metoprolol succinate (TOPROL-XL) 25 MG 24 hr tablet TAKE 1 TABLET (25 MG TOTAL) BY MOUTH DAILY. 09/05/22   Jerrol Banana., MD  mirtazapine (REMERON) 30 MG tablet TAKE ONE TABLET BY MOUTH EVERYDAY AT BEDTIME 11/06/22   Simmons-Robinson, Makiera, MD  omeprazole (PRILOSEC) 20 MG capsule Take 1 capsule (20 mg total) by mouth daily. 06/25/22   Jerrol Banana., MD  ondansetron (ZOFRAN) 4 MG tablet TAKE 1 TABLET BY MOUTH EVERY 8 HOURS AS NEEDED Patient taking differently: Take 4 mg by mouth every 8 (eight) hours as needed for nausea or vomiting. 07/30/22   Jerrol Banana., MD  polyethylene glycol powder [facility]) 17 GM/SCOOP powder Take 1 scoop or 17 g 3 times a day for 2 days then use 1 scoop twice a day a day until you begin having soft bowel movements then cut back to 1 scoop a day. 08/31/22   Nena Polio, MD  rivastigmine (EXELON) 3 MG capsule Take 3 mg by mouth 2 (two) times daily.    [provider]  traMADol (ULTRAM) 50 MG tablet Take 1 tablet (50 mg total) by mouth every 6 (six) hours as needed for moderate pain or severe pain. 08/28/22   Jerrol Banana., MD  zinc gluconate 50 MG tablet Take 1 tablet (50 mg total) by mouth daily. 08/14/22   Elgergawy, Silver Huguenin, MD      Allergies    Lotrel [amlodipine besy-benazepril hcl], Contrast media [iodinated contrast media], Niacin, and Sulfa antibiotics    Review of Systems   Review of Systems  Physical Exam Updated Vital Signs BP 121/63   Pulse (!) 59   Temp (!) 97.2 F (36.2 C) (Oral)   Resp 13   Ht _0  (1.6  m)   Wt 57.2 kg   SpO2 100%   BMI 22.34 kg/m  Physical Exam Constitutional:      General: She is not in acute distress.    Appearance: She is not ill-appearing.     Comments: Oriented to self, year, location (hospital).  Amnestic to events.  Seems confused.  Slow to answer questions.  HENT:     Head: Normocephalic.     Comments: Hematoma that is tender to palpation on left forehead    Nose: Nose normal.     Comments: Skin discoloration from right nare towards maxilla.  This is not tender to palpation.  I do not think this is ecchymosis.    Mouth/Throat:     Mouth: Mucous membranes are moist.     Pharynx: Oropharynx is clear. No oropharyngeal exudate or posterior oropharyngeal erythema.  Eyes:     Extraocular Movements: Extraocular movements intact.     Conjunctiva/sclera: Conjunctivae normal.     Pupils: Pupils are equal, round, and reactive to light.     Comments: Patient does have some amblyopia at rest, she endorses blurred vision though states she normally wears glasses and does not wearing them  Neck:     Comments: No step-offs or deformities Cardiovascular:     Rate and Rhythm: Normal rate and regular rhythm.     Pulses: Normal pulses.     Heart sounds: Normal heart sounds.  Pulmonary:     Effort: Pulmonary effort is normal.     Breath sounds: Normal breath sounds.     Comments: Chest wall is atraumatic Abdominal:     General: Abdomen is flat. There is no distension.     Tenderness: There is no abdominal tenderness. There is no guarding or rebound.     Comments: No contusions or abrasions  Musculoskeletal:     Cervical back: Normal range of motion. No tenderness.     Comments: TTP on the left humeral head, left clavicle, left scapula.  Patient has remote surgical scar over left humeral head.  She is able to flex and extend her left elbow, full range of motion of left wrist, sensation intact.  She is able to abduct and flex this shoulder although does so slowly and ROM is  slightly diminished.  Lower extremities are atraumatic.  Right upper extremity atraumatic.  Distal pulses strong and intact grossly equal bilaterally in upper and lower extremities.  No tenderness, deformities, step-offs, obvious injury to back including T/L-spine  Neurological:     Mental Status: She is alert.     Comments: No facial asymmetry, sensation intact and gross equal in upper and lower extremities bilaterally.  No tremor.  Follows commands     ED Results / Procedures / Treatments   Labs (all labs ordered are listed, but only abnormal results are displayed) Labs Reviewed  COMPREHENSIVE METABOLIC PANEL - Abnormal; Notable for the following components:      Result Value   Creatinine, Ser 1.65 (*)    Alkaline Phosphatase 151 (*)    Total Bilirubin 0.1 (*)    GFR, Estimated 33 (*)    All other components within normal limits  CBC - Abnormal; Notable for the following components:   Hemoglobin 10.8 (*)    HCT 33.8 (*)    All other components within normal limits  I-STAT CHEM 8, ED - Abnormal; Notable for the following components:   Creatinine, Ser 1.50 (*)    Hemoglobin 11.6 (*)    HCT 34.0 (*)    All other components within normal limits  CBG MONITORING, ED - Abnormal; Notable for the following components:   Glucose-Capillary 65 (*)    All other components within normal limits  ETHANOL  PROTIME-INR  URINALYSIS, ROUTINE W REFLEX MICROSCOPIC  CBG MONITORING, ED    EKG None  Radiology CT HEAD WO CONTRAST  Result Date: 11/19/2022 CLINICAL DATA:  Trauma, fall EXAM: CT HEAD WITHOUT CONTRAST TECHNIQUE: Contiguous axial images were obtained from the base of the skull through the vertex without intravenous contrast. RADIATION DOSE REDUCTION: This exam was performed according to the departmental dose-optimization program which includes automated exposure control, adjustment of the mA and/or kV according to patient size and/or use of iterative reconstruction technique.  COMPARISON:  08/12/2022 FINDINGS: Brain: No acute intracranial findings are seen in noncontrast CT brain. There are no signs of bleeding within the cranium. Minimal calcifications are seen in basal ganglia. Cortical sulci are prominent. Scattered dural calcifications are seen. Vascular: Unremarkable. Skull: No fracture is seen in calvarium. There is subcutaneous contusion/hematoma in the left frontal scalp. Sinuses/Orbits: There are no air-fluid levels in paranasal sinuses. There is a old small blowout fracture in the floor of left orbit with no interval change. There is no herniation of orbital contents. Other: None. IMPRESSION: No acute intracranial findings are seen in noncontrast CT brain. Atrophy. No fracture is seen in calvarium. Electronically Signed   By: Elmer Picker M.D.   On: 11/19/2022 13:03   CT CERVICAL SPINE WO CONTRAST  Result Date: 11/19/2022 CLINICAL DATA:  Trauma EXAM: CT CERVICAL SPINE WITHOUT CONTRAST TECHNIQUE: Multidetector CT imaging of the cervical spine was performed without intravenous contrast. Multiplanar CT image reconstructions were also generated. RADIATION DOSE REDUCTION: This exam was performed according to the departmental dose-optimization program which includes automated exposure control, adjustment of the mA and/or kV according to patient size and/or use of iterative reconstruction technique. COMPARISON:  Cervical spine radiographs done on 08/08/2022 and CT cervical spine done on 09/14/2016 FINDINGS: Alignment: There is minimal 1-2 mm retrolisthesis at C5-C6 level. This finding has not changed. Skull base and vertebrae: No recent fracture is seen. Degenerative changes are noted with disc space narrowing and bony spurs at multiple levels. Soft tissues and spinal canal: There is no central spinal stenosis. Posterior bony spurs are causing extrinsic pressure over the ventral margin of thecal sac at multiple levels. Disc levels: There is encroachment of neural foramina by  bony spurs from C3-C7 levels. Upper chest: Unremarkable. Other: There is inhomogeneous attenuation in thyroid. There is 1.8 cm low-density nodule in the posterior right lobe. These findings have not changed significantly. IMPRESSION: No recent fracture is seen in cervical spine.  Cervical spondylosis with encroachment of neural foramina from C3-C7 levels. There is inhomogeneous attenuation in thyroid with 1.8 cm nodule in the right lobe. Similar finding was seen in previous CT done on 09/14/2016. Electronically Signed   By: Elmer Picker M.D.   On: 11/19/2022 12:58   DG Chest Portable 1 View  Result Date: 11/19/2022 CLINICAL DATA:  History of fall in a 71 year old female. Level 2 trauma. EXAM: PORTABLE CHEST 1 VIEW COMPARISON:  August 21, 2022 FINDINGS: EKG leads project over the chest. Cardiac loop recorder projects over the LEFT chest. Trachea midline. Cardiomediastinal contours and hilar structures are normal. Lungs are clear. No pneumothorax or sign of pleural effusion on AP portable radiograph. On limited assessment no acute skeletal process. IMPRESSION: 1. No acute cardiopulmonary disease. 2. Cardiac loop recorder in place. Electronically Signed   By: Zetta Bills M.D.   On: 11/19/2022 12:25    Procedures Procedures    Medications Ordered in ED Medications  levETIRAcetam (KEPPRA) IVPB 1000 mg/100 mL premix (1,000 mg Intravenous Not Given 11/19/22 1418)    Followed by  levETIRAcetam (KEPPRA) IVPB 1000 mg/100 mL premix (0 mg Intravenous Stopped 11/19/22 1357)  LORazepam (ATIVAN) 2 MG/ML injection (2 mg  Given 11/19/22 1258)  levETIRAcetam (KEPPRA) 1000 MG/100ML IVPB (0 mg  Stopped 11/19/22 1332)  levETIRAcetam (KEPPRA) IVPB 1500 mg/ 100 mL premix (0 mg Intravenous Stopped 11/19/22 1412)  dextrose 50 % solution 25 mL (25 mLs Intravenous Given 11/19/22 1438)    ED Course/ Medical Decision Making/ A&P                           Medical Decision Making Amount and/or Complexity of Data  Reviewed External Data Reviewed: labs. Labs: ordered. Decision-making details documented in ED Course. Radiology: ordered and independent interpretation performed. Decision-making details documented in ED Course. ECG/medicine tests: ordered and independent interpretation performed. Decision-making details documented in ED Course.  Risk Prescription drug management. Decision regarding hospitalization.   Patient presents as above.  At this time, unknown mental status baseline.  Patient has hematoma on forehead.  Based on chart review, I do not think she takes blood thinners.  CT head, CT C-spine ordered.  Bedside chest x-ray showed no obvious bony injury, no pneumothorax.  While patient was at the [facility], she began to have repetitive facial movements.  Trauma team called neurology.  I spoke with neurology in regards to the patient.  Patient has a history of focal seizures and has reportedly not been taking her medications.  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  She was given Ativan and loaded with Keppra.  EEG placed.  On my reevaluation, patient was somnolent, likely secondary to the Ativan and Keppra.  CBC showed no leukocytosis.  Hemoglobin 10.8, similar to prior.  Platelets within normal limits.  CMP showed normal electrolytes, creatinine elevated 1.65.  Baseline creatinine around 1.45.  BUN within normal limits.  Blood sugar within normal limits.  AST and ALT unremarkable.  Patient's alk phos is elevated at 151.  On chart review, this is baseline.  EtOH negative.  I personally reviewed the imaging noted below: CXR: No pneumothorax, no obvious displaced fracture, no focal consolidation or opacification. CT C-spine showed no acute fracture.  Patient does have cervical spondylosis. CT head showed no acute intracranial hemorrhage, no acute findings. Left shoulder x-ray shows no acute fracture or dislocation.  Neurology attending noted that  patient is no longer  seizing on EEG but she was actively seizing initially.  Patient is cleared for admission to floor.  Patient with the admitting team.  Patient is admitted to the hospitalist service.        Final Clinical Impression(s) / ED Diagnoses Final diagnoses:  None    Rx / DC Orders ED Discharge Orders     None         Luster Landsberg, MD 11/19/22 [place], [place], DO 11/20/22 [phone]

## 2022-11-19 NOTE — ED Notes (Signed)
Purewick placed on pt. 

## 2022-11-19 NOTE — Progress Notes (Signed)
EEG complete - results pending 

## 2022-11-19 NOTE — ED Provider Notes (Signed)
She supervise resident visit.  Patient here as a level 2 trauma after she hit her head and had declining GCS.  However on arrival here she is awake and alert.  She has a history of hypertension, memory difficulties, diabetes.  Patient was doing some housework when she had the back of her head on a towel rack.  Did not seem to lose consciousness.  Patient was able to ambulate to the couch.  Prior to my evaluation patient went for CT of the head and neck.  And supposedly had seizure-like activity.  Neurology came to the bedside and gave patient Ativan and loaded them IV Keppra.  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  Upon my evaluation patient is sedated.  She is currently getting an EEG.  Family is at the bedside.  I did see the patient briefly when she first showed up and she seemed awake and alert.  She is complaining of left shoulder pain.  Resident noted to she was well-appearing as well.  Traumatic workup was initially initiated with head CT, basic labs, CT cervical spine, x-ray of the left shoulder, x-ray chest.  Blood work per my review and interpretation is unremarkable.  CT of the head and neck are unremarkable.  Chest x-ray without evidence of pneumonia pneumothorax per my review interpretation of x-ray.  Patient is still pending x-ray of the left shoulder.  She is currently getting EEG.  Neurology recommends admission given medical workup.  However she is getting EEG and want to make sure she is not in status epilepticus before admitting her to possibly the floor.  May need higher level of care.  Awaiting neurology recommendations.  She will still need to get the x-ray of her left shoulder.  Please see oncoming ED staff note for further results, evaluation, disposition of the patient.  This chart was dictated using voice recognition software.  Despite best efforts to proofread,  errors can occur which can change the documentation meaning.     Lennice Sites, DO 11/19/22 1513

## 2022-11-19 NOTE — ED Notes (Signed)
Checked patient blood sugar it was 75 notified RN Claiborne Billings got patient some warm blankets patient is resting with call bell in reach

## 2022-11-19 NOTE — Progress Notes (Signed)
Orthopedic Tech Progress Note Patient Details:  Julie Acevedo Oct 04, 1951 [id number] Level 2 trauma. Not needed Patient ID: [name], female   DOB: 04/22/1951, 71 y.o.   MRN: [id number]  Chip Boer 11/19/2022, 12:43 PM

## 2022-11-19 NOTE — ED Notes (Signed)
Patient returned from CT  smacking her lips  patient is able to talk husband at bedside, Dr. Quinn Axe at bedside.

## 2022-11-19 NOTE — Progress Notes (Signed)
LTM EEG hooked up and running - no initial skin breakdown - push button tested - same leads used.

## 2022-11-19 NOTE — ED Notes (Signed)
Pt has no IV access. IV consult placed.

## 2022-11-19 NOTE — ED Notes (Signed)
EEG at bedside.

## 2022-11-19 NOTE — ED Triage Notes (Signed)
Patient presents to ED via [facility] states she is coming from home was in the bathroom cleaning and fell hitting her head on the ceramic tile , hematoma to left forehead c/o pain left shoulder patient has a history of dementia. Husband states patient was able to get up and walk down the hall and sit on the couch, appeared more confused to him.

## 2022-11-19 NOTE — ED Notes (Signed)
Notified provider of pts HR and current status

## 2022-11-19 NOTE — Progress Notes (Signed)
Trauma RN with patient and noted that patient was having some neuro symptoms (aphasia and confusion). This RN was walking by and she asked me to evaluate the patient.   Patient has history of recent fall not on thinners this morning. The patient was with family and stood up and hit her head. Husband noted some tremoring and helped her to sit down so she would not fall. Post episode, family reported that patient's speech was abnormal and she was not acting herself.   Originally encoded as fall not on thinners and activated as a Level 2 Trauma. Upon arrival to the [place], this RN notified.   The patient was noted to be alert with bruising to the right of the mouth, left arm noted to have some pain with shoulder, but some weakness. Left leg weak > right and patient reported left sided decrease sensation on the left side. Pt able to complete aphasia cards with no problems, but active lip smacking noted. The patient did not have a clear LKW at this time so Neurology called to evaluate and Trauma RN sent to identify [place].   Upon Neurology arrival, pt was assessed and patient thought to be having active seizure. No Code Stroke called at this time.   No IV access noted. Pt brought back to room 22. IV obtained in right AC. 2 mg of Ativan given at 1258. Pt's lip smacking ceased after medication.   Plan of Care: Keppra and Seizure precautions.   Handoff given to [employer], Therapist, sports.

## 2022-11-19 NOTE — H&P (Signed)
History and Physical    Julie Acevedo JME:[id number] DOB: 01-May-1951 DOA: 11/19/2022  PCP: Jerrol Banana., MD  Patient coming from: Home  I have personally briefly reviewed patient's old medical records available.   Chief Complaint: Hit her head and has headache.  HPI: Julie Acevedo is a 71 y.o. female with medical history significant of mild [name] but functional at home, she drives and shops, has history of hypertension and hyperlipidemia presents to the emergency room with fall at home.  She was cleaning her bathroom, when she stood up she hit towel bar and then fell on the tile.  She developed hematoma left forehead, was complaining of left shoulder pain.  After injury she was able to get up and walk to the couch.  Appeared more confused than usual so they called EMS and she was brought to emergency room.  Apparently, for a skeletal survey, patient was sent to CT scan and she was noted to have smacking her lips continuously that was evaluated by neurology at the bedside and recommended to treat for focal seizure.  Skeletal survey negative including CT scan of the head and CT scan of the cervical spine as well as left shoulder.  On my evaluation, husband was at the bedside to give me history.  Patient was given Ativan in the CT scan, she is following simple commands and looks calm and quiet but not interactive.  She is sleepy. Husband reported no recent change in medications.  She does follow-up with neurology with concern about early [name].  Apparently does have history of seizure but remained seizure-free for many many years and not on any seizure medication as per her husband. ED Course: Initially hemodynamically stable.  Alert awake.  Seen by ER physician.  Taken to CT scan where she had lipsmacking and slight confusion that was evaluated by neurology at the bedside and patient was given a dose of Ativan 2 mg and patient is sleepy since then.  She is maintaining  airway.  She was also loaded with Keppra thousand, thousand and 1500 subsequently.  Spot EEG without seizure episodes.  Patient is currently getting continuous EEG.  Admission requested for seizure work-up.  Review of Systems: Limited.  Patient sleepy.  Husband at the bedside provides history.  Past Medical History:  Diagnosis Date   Anemia    Arthritis    COVID-19 11/19/2019   Diverticulitis    DM (diabetes mellitus) ([place])    typ e 2   History of methicillin resistant staphylococcus aureus (MRSA) 2017   HTN (hypertension)    Hyperlipidemia    Memory difficulties 09/01/2015   Multiple thyroid nodules    Myocardial infarction ([place]) 1995   Short-term memory loss    Thyroid nodule     Past Surgical History:  Procedure Laterality Date   ABDOMINAL HYSTERECTOMY     due to endometriosis-1 ovary left   BREAST EXCISIONAL BIOPSY Right 1971   neg   CARDIAC CATHETERIZATION  2000   no significant CAD per note of Dr Caryl Comes in 2013    [place] N/A 03/19/2017   Procedure: ANTERIOR REPAIR (CYSTOCELE);  Surgeon: Bjorn Loser, MD;  Location: [facility];  Service: Urology;  Laterality: N/A;   CYSTOSCOPY N/A 03/19/2017   Procedure: CYSTOSCOPY;  Surgeon: Bjorn Loser, MD;  Location: [facility];  Service: Urology;  Laterality: N/A;   ESOPHAGOGASTRODUODENOSCOPY (EGD) WITH PROPOFOL N/A 12/15/2021   Procedure: ESOPHAGOGASTRODUODENOSCOPY (EGD) WITH PROPOFOL;  Surgeon: Lesly Rubenstein, MD;  Location: [facility] ENDOSCOPY;  Service: Endoscopy;  Laterality: N/A;   EYE SURGERY     FOOT SURGERY     HAND SURGERY     HEMICOLECTOMY     INSERTION OF MESH N/A 08/21/2016   Procedure: INSERTION OF MESH;  Surgeon: Excell Seltzer, MD;  Location: [facility];  Service: General;  Laterality: N/A;   INSERTION OF MESH N/A 12/08/2019   Procedure: INSERTION OF MESH;  Surgeon: Jules Husbands, MD;  Location: [facility] ORS;  Service: General;  Laterality: N/A;   LAPAROSCOPIC LYSIS OF ADHESIONS N/A  08/21/2016   Procedure: LAPAROSCOPIC LYSIS OF ADHESIONS;  Surgeon: Excell Seltzer, MD;  Location: [facility];  Service: General;  Laterality: N/A;   LAPAROSCOPIC REMOVAL ABDOMINAL MASS     LAPAROTOMY N/A 04/16/2019   Procedure: EXPLORATORY LAPAROTOMY;  Surgeon: Benjamine Sprague, DO;  Location: [facility] ORS;  Service: General;  Laterality: N/A;   LOOP RECORDER INSERTION N/A 03/21/2018   Procedure: LOOP RECORDER INSERTION;  Surgeon: Sanda Klein, MD;  Location: [place] [facility];  Service: Cardiovascular;  Laterality: N/A;   LUMBAR DISC SURGERY     ROUX-EN-Y GASTRIC BYPASS  2010   Dr Hassell Done   SMALL INTESTINE SURGERY     TONSILLECTOMY     UPPER GI ENDOSCOPY  01/12/2016   normal larynx, normal esophagus. gastric bypass with a normal-sized pouch and intact staple line, normal examined jejunum, otherwise exam was normal   VENTRAL HERNIA REPAIR N/A 08/21/2016   Procedure: LAPAROSCOPIC VENTRAL HERNIA;  Surgeon: Excell Seltzer, MD;  Location: [facility];  Service: General;  Laterality: N/A;   XI ROBOTIC ASSISTED VENTRAL HERNIA N/A 12/08/2019   Procedure: XI ROBOTIC ASSISTED VENTRAL HERNIA;  Surgeon: Jules Husbands, MD;  Location: [facility] ORS;  Service: General;  Laterality: N/A;    Social history   reports that she has never smoked. She has never used smokeless tobacco. She reports that she does not drink alcohol and does not use drugs.  Allergies  Allergen Reactions   Lotrel [Amlodipine Besy-Benazepril Hcl] Anaphylaxis   Contrast Media [Iodinated Contrast Media] Swelling and Other (See Comments)    Causes neck swelling    Niacin Hives   Sulfa Antibiotics Hives    Family History  Problem Relation Age of Onset   Cancer Father 60       Lung   Coronary artery disease Father 4   COPD Mother    Diabetes Mother    Hypertension Mother    Cancer Paternal Grandmother        Breast   Breast cancer Paternal Grandmother    Congestive Heart Failure Maternal Grandmother    Emphysema Maternal Grandfather     Heart attack Paternal Grandfather    Cancer Paternal Aunt        Breast   Breast cancer Paternal Aunt 66   Breast cancer Paternal Aunt 15     Prior to Admission medications   Medication Sig Start Date End Date Taking? Authorizing Provider  ACCU-CHEK GUIDE test strip USE TO CHECK BLOOD SUGAR UP TO 4 TIMES A DAY AS DIRECTED 10/03/22   Brita Romp, Dionne Bucy, MD  atorvastatin (LIPITOR) 40 MG tablet TAKE ONE TABLET BY MOUTH EVERY MORNING 09/12/22   Jerrol Banana., MD  blood glucose meter kit and supplies Dispense Accu-Chek Guide. Use up to four times daily as directed. 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 08/16/22   Jerrol Banana., MD  calcitRIOL (ROCALTROL) 0.25 MCG capsule Take 0.25  mcg by mouth daily.    [provider]  copper tablet Take 1 tablet (2 mg total) by mouth daily. 08/14/22   Elgergawy, Silver Huguenin, MD  cyanocobalamin (VITAMIN B12) 500 MCG tablet Take 1 tablet (500 mcg total) by mouth daily. 08/14/22   Elgergawy, Silver Huguenin, MD  diazoxide (PROGLYCEM) 50 MG/ML suspension Take 1 mL (50 mg total) by mouth every 8 (eight) hours. 08/14/22   Elgergawy, Silver Huguenin, MD  gabapentin (NEURONTIN) 300 MG capsule Take 1 capsule (300 mg total) by mouth 3 (three) times daily. 07/02/22   Jerrol Banana., MD  Glucagon (GVOKE HYPOPEN 2-PACK) 1 MG/0.2ML SOAJ Inject 0.2 mLs into the skin as needed. 08/21/22   Jerrol Banana., MD  hydrOXYzine (VISTARIL) 100 MG capsule Take 1 capsule (100 mg total) by mouth 3 (three) times daily. 07/02/22   Jerrol Banana., MD  linaclotide [facility]) 290 MCG CAPS capsule TAKE 1 CAPSULE BY MOUTH EVERY DAY BEFORE BREAKFAST 06/25/22   Jerrol Banana., MD  metoprolol succinate (TOPROL-XL) 25 MG 24 hr tablet TAKE 1 TABLET (25 MG TOTAL) BY MOUTH DAILY. 09/05/22   Jerrol Banana., MD  mirtazapine (REMERON) 30 MG tablet TAKE ONE TABLET BY MOUTH EVERYDAY AT BEDTIME 11/06/22   Simmons-Robinson, Makiera, MD  omeprazole (PRILOSEC) 20 MG capsule  Take 1 capsule (20 mg total) by mouth daily. 06/25/22   Jerrol Banana., MD  ondansetron (ZOFRAN) 4 MG tablet TAKE 1 TABLET BY MOUTH EVERY 8 HOURS AS NEEDED Patient taking differently: Take 4 mg by mouth every 8 (eight) hours as needed for nausea or vomiting. 07/30/22   Jerrol Banana., MD  polyethylene glycol powder [facility]) 17 GM/SCOOP powder Take 1 scoop or 17 g 3 times a day for 2 days then use 1 scoop twice a day a day until you begin having soft bowel movements then cut back to 1 scoop a day. 08/31/22   Nena Polio, MD  rivastigmine (EXELON) 3 MG capsule Take 3 mg by mouth 2 (two) times daily.    [provider]  traMADol (ULTRAM) 50 MG tablet Take 1 tablet (50 mg total) by mouth every 6 (six) hours as needed for moderate pain or severe pain. 08/28/22   Jerrol Banana., MD  zinc gluconate 50 MG tablet Take 1 tablet (50 mg total) by mouth daily. 08/14/22   Elgergawy, Silver Huguenin, MD    Physical Exam: Vitals:   11/19/22 1345 11/19/22 1400 11/19/22 1430 11/19/22 1500  BP: 134/66 (!) 159/72 121/63 119/60  Pulse: 61 (!) 53 (!) 59 (!) 57  Resp: (!) 23 (!) _0 Temp:      TempSrc:      SpO2: 94% 99% 100% 100%  Weight:      Height:        Constitutional: NAD, calm, comfortable, sleepy.  Follows simple commands.  Not in obvious distress.  On room air. Vitals:   11/19/22 1345 11/19/22 1400 11/19/22 1430 11/19/22 1500  BP: 134/66 (!) 159/72 121/63 119/60  Pulse: 61 (!) 53 (!) 59 (!) 57  Resp: (!) 23 (!) _1 Temp:      TempSrc:      SpO2: 94% 99% 100% 100%  Weight:      Height:       Eyes: PERRL, lids and conjunctivae normal ENMT: Mucous membranes are dry.  Posterior pharynx clear of any exudate or lesions.Normal dentition.  Neck: normal,  supple, no masses, no thyromegaly Respiratory: clear to auscultation bilaterally, no wheezing, no crackles. Normal respiratory effort. No accessory muscle use.  Cardiovascular: Regular rate and rhythm, no murmurs  / rubs / gallops. No extremity edema. 2+ pedal pulses. No carotid bruits.  Abdomen: no tenderness, no masses palpated. No hepatosplenomegaly. Bowel sounds positive.  Musculoskeletal: no clubbing / cyanosis. No joint deformity upper and lower extremities. Good ROM, no contractures. Normal muscle tone.  Skin: no rashes, lesions, ulcers. No induration Neurologic: CN 2-12 grossly intact. Strength 4/5 in all 4.  Psychiatric: Sleepy and sedated.  Follows simple commands. Ecchymosis on the face.    Labs on Admission: I have personally reviewed following labs and imaging studies  CBC: Recent Labs  Lab 11/19/22 1219 11/19/22 1220  WBC 5.5  --   HGB 10.8* 11.6*  HCT 33.8* 34.0*  MCV 85.4  --   PLT 247  --    Basic Metabolic Panel: Recent Labs  Lab 11/19/22 1219 11/19/22 1220  NA 141 144  K 3.6 3.7  CL 108 107  CO2 27  --   GLUCOSE 85 83  BUN 10 11  CREATININE 1.65* 1.50*  CALCIUM 8.9  --    GFR: Estimated Creatinine Clearance: 29.3 mL/min (A) (by C-G formula based on SCr of 1.5 mg/dL (H)). Liver Function Tests: Recent Labs  Lab 11/19/22 1219  AST 37  ALT 36  ALKPHOS 151*  BILITOT 0.1*  PROT 6.7  ALBUMIN 3.5   No results for input(s): "LIPASE", "AMYLASE" in the last 168 hours. No results for input(s): "AMMONIA" in the last 168 hours. Coagulation Profile: Recent Labs  Lab 11/19/22 1219  INR 1.1   Cardiac Enzymes: No results for input(s): "CKTOTAL", "CKMB", "CKMBINDEX", "TROPONINI" in the last 168 hours. BNP (last 3 results) No results for input(s): "PROBNP" in the last 8760 hours. HbA1C: No results for input(s): "HGBA1C" in the last 72 hours. CBG: Recent Labs  Lab 11/19/22 1301 11/19/22 1411  GLUCAP 65* 75   Lipid Profile: No results for input(s): "CHOL", "HDL", "LDLCALC", "TRIG", "CHOLHDL", "LDLDIRECT" in the last 72 hours. Thyroid Function Tests: No results for input(s): "TSH", "T4TOTAL", "FREET4", "T3FREE", "THYROIDAB" in the last 72 hours. Anemia  Panel: No results for input(s): "VITAMINB12", "FOLATE", "FERRITIN", "TIBC", "IRON", "RETICCTPCT" in the last 72 hours. Urine analysis:    Component Value Date/Time   COLORURINE YELLOW (A) 08/31/2022 0752   APPEARANCEUR HAZY (A) 08/31/2022 0752   APPEARANCEUR Cloudy (A) 09/28/2019 1348   LABSPEC 1.016 08/31/2022 0752   LABSPEC 1.016 02/17/2014 2031   PHURINE 8.0 08/31/2022 0752   GLUCOSEU NEGATIVE 08/31/2022 0752   GLUCOSEU Negative 02/17/2014 2031   HGBUR NEGATIVE 08/31/2022 0752   BILIRUBINUR NEGATIVE 08/31/2022 0752   BILIRUBINUR moderate 12/15/2020 1132   BILIRUBINUR Negative 09/28/2019 1348   BILIRUBINUR Negative 02/17/2014 2031   KETONESUR NEGATIVE 08/31/2022 0752   PROTEINUR 30 (A) 08/31/2022 0752   UROBILINOGEN 2.0 (A) 12/15/2020 1132   UROBILINOGEN 0.2 11/15/2012 1507   NITRITE NEGATIVE 08/31/2022 0752   LEUKOCYTESUR NEGATIVE 08/31/2022 0752   LEUKOCYTESUR Negative 02/17/2014 2031    Radiological Exams on Admission: DG Shoulder Left Port  Result Date: 11/19/2022 CLINICAL DATA:  Fall, left shoulder pain EXAM: LEFT SHOULDER COMPARISON:  None Available. FINDINGS: No fracture. No glenohumeral dislocation. No evidence of acromioclavicular separation. Mild AC joint osteoarthritis. No significant glenohumeral arthropathy. No suspicious focal osseous lesions. Loop recorder overlies the left chest. IMPRESSION: No fracture or malalignment in the left shoulder. Mild AC joint osteoarthritis.  Electronically Signed   By: Ilona Sorrel M.D.   On: 11/19/2022 15:22   CT HEAD WO CONTRAST  Result Date: 11/19/2022 CLINICAL DATA:  Trauma, fall EXAM: CT HEAD WITHOUT CONTRAST TECHNIQUE: Contiguous axial images were obtained from the base of the skull through the vertex without intravenous contrast. RADIATION DOSE REDUCTION: This exam was performed according to the departmental dose-optimization program which includes automated exposure control, adjustment of the mA and/or kV according to patient  size and/or use of iterative reconstruction technique. COMPARISON:  08/12/2022 FINDINGS: Brain: No acute intracranial findings are seen in noncontrast CT brain. There are no signs of bleeding within the cranium. Minimal calcifications are seen in basal ganglia. Cortical sulci are prominent. Scattered dural calcifications are seen. Vascular: Unremarkable. Skull: No fracture is seen in calvarium. There is subcutaneous contusion/hematoma in the left frontal scalp. Sinuses/Orbits: There are no air-fluid levels in paranasal sinuses. There is a old small blowout fracture in the floor of left orbit with no interval change. There is no herniation of orbital contents. Other: None. IMPRESSION: No acute intracranial findings are seen in noncontrast CT brain. Atrophy. No fracture is seen in calvarium. Electronically Signed   By: Elmer Picker M.D.   On: 11/19/2022 13:03   CT CERVICAL SPINE WO CONTRAST  Result Date: 11/19/2022 CLINICAL DATA:  Trauma EXAM: CT CERVICAL SPINE WITHOUT CONTRAST TECHNIQUE: Multidetector CT imaging of the cervical spine was performed without intravenous contrast. Multiplanar CT image reconstructions were also generated. RADIATION DOSE REDUCTION: This exam was performed according to the departmental dose-optimization program which includes automated exposure control, adjustment of the mA and/or kV according to patient size and/or use of iterative reconstruction technique. COMPARISON:  Cervical spine radiographs done on 08/08/2022 and CT cervical spine done on 09/14/2016 FINDINGS: Alignment: There is minimal 1-2 mm retrolisthesis at C5-C6 level. This finding has not changed. Skull base and vertebrae: No recent fracture is seen. Degenerative changes are noted with disc space narrowing and bony spurs at multiple levels. Soft tissues and spinal canal: There is no central spinal stenosis. Posterior bony spurs are causing extrinsic pressure over the ventral margin of thecal sac at multiple levels.  Disc levels: There is encroachment of neural foramina by bony spurs from C3-C7 levels. Upper chest: Unremarkable. Other: There is inhomogeneous attenuation in thyroid. There is 1.8 cm low-density nodule in the posterior right lobe. These findings have not changed significantly. IMPRESSION: No recent fracture is seen in cervical spine. Cervical spondylosis with encroachment of neural foramina from C3-C7 levels. There is inhomogeneous attenuation in thyroid with 1.8 cm nodule in the right lobe. Similar finding was seen in previous CT done on 09/14/2016. Electronically Signed   By: Elmer Picker M.D.   On: 11/19/2022 12:58   DG Chest Portable 1 View  Result Date: 11/19/2022 CLINICAL DATA:  History of fall in a 71 year old female. Level 2 trauma. EXAM: PORTABLE CHEST 1 VIEW COMPARISON:  August 21, 2022 FINDINGS: EKG leads project over the chest. Cardiac loop recorder projects over the LEFT chest. Trachea midline. Cardiomediastinal contours and hilar structures are normal. Lungs are clear. No pneumothorax or sign of pleural effusion on AP portable radiograph. On limited assessment no acute skeletal process. IMPRESSION: 1. No acute cardiopulmonary disease. 2. Cardiac loop recorder in place. Electronically Signed   By: Zetta Bills M.D.   On: 11/19/2022 12:25    EKG: Independently reviewed.  Normal sinus rhythm.  No acute changes.  Assessment/Plan Principal Problem:   Seizure [facility]) Active Problems:   Benign  essential HTN   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, psychotic disturbance, mood disturbance, or anxiety ([place])   Type 2 diabetes mellitus ([place])     1.  Seizure disorder: Presented with a witnessed seizure at the CT scan by neurology.  Loaded with Keppra.  Further Keppra dosing as per neurology.  Seizure precautions.  Fall precautions.  Given her history of [name], patient probably will need to be on antiseizure medications lifelong.  2.  Fall with soft tissue  injuries: Symptomatic management.  Mobilize once he is more awake.  Skeletal survey was negative.  3.  Diet-controlled type 2 diabetes with risk of hypoglycemia: We will keep patient on D5 half-normal saline infusion.  Monitor blood sugars very closely.  4.  Essential hypertension: Blood pressures well controlled.  5.  Leg body dementia: She still drives and shops.  With evidence of seizures, she will have driving restrictions.  Recommended [place] DMV statutes, patients with seizures are not allowed to drive until they have been seizure-free for six months. Use caution when using heavy equipment or power tools. Avoid working on ladders or at heights. Take showers instead of baths. Ensure the water temperature is not too high on the home water heater. Do not go swimming alone. Do not lock yourself in a room alone (i.e. bathroom). Maintain good sleep hygiene. Avoid alcohol.     DVT prophylaxis: Heparin subcu Code Status: Full code Family Communication: Husband at the bedside Disposition Plan: Home Consults called: Neurology, following from ER Admission status: Inpatient.  Stroke work-up.   Barb Merino MD [facility] Pager [phone]

## 2022-11-19 NOTE — ED Notes (Signed)
Got patient undressed on the monitor di manual blood pressure di EKG shown to er provider patient is resting with call bell in reach

## 2022-11-19 NOTE — ED Notes (Signed)
Transported to CT 

## 2022-11-19 NOTE — Consult Note (Signed)
NEUROLOGY CONSULTATION NOTE   Date of service: November 19, 2022 Patient Name: Julie Acevedo MRN:  [id number] DOB:  Jul 06, 1951 Reason for consult: focal status epilepticus Requesting physician: Dr. Lennice Sites _ _ _   _ __   _ __ _ _  __ __   _ __   __ _  History of Present Illness   Julie Acevedo is a 71 y.o. female with PMH significant for  has a past medical history of Anemia, Arthritis, COVID-19 (11/19/2019), Diverticulitis, DM (diabetes mellitus) ([place]), History of methicillin resistant staphylococcus aureus (MRSA) (2017), HTN (hypertension), Hyperlipidemia, Memory difficulties (09/01/2015), Multiple thyroid nodules, Myocardial infarction ([place]) (1995), Short-term memory loss, and Thyroid nodule. who presents with seizure. Patient presented with oral automatisms after hitting her head on a towel rack. She has lewy body dementia f/b Dr. [name] but is functional at home per husband. She was changing a wastebasket in the bathroom and stood up and hit her head on a towel rack above it. CT head in ED NAICP personal review. Trauma noted lip smacking movements and called neurology for eval. On my examination she had oral automatisms and was disoriented. Her LUE exam was limited 2/2 pain. BLE weakness slightly weaker on the left. Husband states he thought she was on seizure medicine but there is none on her list or in her medicine bag other than gabapentin 318m tid. Patient's seizure aborted with ativan and she fell asleep. Stroke code was not activated bc sx were attributed to active seizure. STAT EEG at that point showed no e/o nonconvulsive seizure. Loaded on 3.5g keppra.   ROS   UTA 2/2 mental status  Past History   I have reviewed the following:  Past Medical History:  Diagnosis Date   Anemia    Arthritis    COVID-19 11/19/2019   Diverticulitis    [place]    typ e 2   History of methicillin resistant staphylococcus aureus (MRSA) 2017   HTN (hypertension)     Hyperlipidemia    Memory difficulties 09/01/2015   Multiple thyroid nodules    [place]   Short-term memory loss    Thyroid nodule    Past Surgical History:  Procedure Laterality Date   ABDOMINAL HYSTERECTOMY     due to endometriosis-1 ovary left   BREAST EXCISIONAL BIOPSY Right 1971   neg   CARDIAC CATHETERIZATION  2000   no significant CAD per note of Dr KCaryl Comesin 2013    CC-RoadN/A 03/19/2017   Procedure: ANTERIOR REPAIR (CYSTOCELE);  Surgeon: SBjorn Loser MD;  [facility];  Service: Urology;  Laterality: N/A;   CYSTOSCOPY N/A 03/19/2017   Procedure: CYSTOSCOPY;  Surgeon: SBjorn Loser MD;  [facility];  Service: Urology;  Laterality: N/A;   ESOPHAGOGASTRODUODENOSCOPY (EGD) WITH PROPOFOL N/A 12/15/2021   Procedure: ESOPHAGOGASTRODUODENOSCOPY (EGD) WITH PROPOFOL;  Surgeon: LLesly Rubenstein MD;  Location: [facility];  Service: Endoscopy;  Laterality: N/A;   EYE SURGERY     FOOT SURGERY     HAND SURGERY     HEMICOLECTOMY     INSERTION OF MESH N/A 08/21/2016   Procedure: INSERTION OF MESH;  Surgeon: BExcell Seltzer MD;  [facility];  Service: General;  Laterality: N/A;   INSERTION OF MESH N/A 12/08/2019   Procedure: INSERTION OF MESH;  Surgeon: PJules Husbands MD;  Location: [facility];  Service: General;  Laterality: N/A;   LAPAROSCOPIC LYSIS OF ADHESIONS  N/A 08/21/2016   Procedure: LAPAROSCOPIC LYSIS OF ADHESIONS;  Surgeon: Excell Seltzer, MD;  [facility];  Service: General;  Laterality: N/A;   LAPAROSCOPIC REMOVAL ABDOMINAL MASS     LAPAROTOMY N/A 04/16/2019   Procedure: EXPLORATORY LAPAROTOMY;  Surgeon: Benjamine Sprague, DO;  Location: [facility];  Service: General;  Laterality: N/A;   LOOP RECORDER INSERTION N/A 03/21/2018   Procedure: LOOP RECORDER INSERTION;  Surgeon: Sanda Klein, MD;  Location: [place] [facility];  Service: Cardiovascular;  Laterality: N/A;   LUMBAR DISC SURGERY      ROUX-EN-Y GASTRIC BYPASS  2010   Dr Hassell Done   SMALL INTESTINE SURGERY     TONSILLECTOMY     UPPER GI ENDOSCOPY  01/12/2016   normal larynx, normal esophagus. gastric bypass with a normal-sized pouch and intact staple line, normal examined jejunum, otherwise exam was normal   VENTRAL HERNIA REPAIR N/A 08/21/2016   Procedure: LAPAROSCOPIC VENTRAL HERNIA;  Surgeon: Excell Seltzer, MD;  [facility];  Service: General;  Laterality: N/A;   XI ROBOTIC ASSISTED VENTRAL HERNIA N/A 12/08/2019   Procedure: XI ROBOTIC ASSISTED VENTRAL HERNIA;  Surgeon: Jules Husbands, MD;  Location: [facility];  Service: General;  Laterality: N/A;   Family History  Problem Relation Age of Onset   Cancer Father 7       Lung   Coronary artery disease Father 39   COPD Mother    Diabetes Mother    Hypertension Mother    Cancer Paternal Grandmother        Breast   Breast cancer Paternal Grandmother    Congestive Heart Failure Maternal Grandmother    Emphysema Maternal Grandfather    Heart attack Paternal Grandfather    Cancer Paternal Aunt        Breast   Breast cancer Paternal Aunt 64   Breast cancer Paternal Aunt 87   Social History   Socioeconomic History   Marital status: Married    Spouse name: Not on file   Number of children: 3   Years of education: 14   Highest education level: Some college, no degree  Occupational History   Occupation: IT consultant: RETIRED  Tobacco Use   Smoking status: Never   Smokeless tobacco: Never  Vaping Use   Vaping Use: Never used  Substance and Sexual Activity   Alcohol use: No   Drug use: No   Sexual activity: Never  Other Topics Concern   Not on file  Social History Narrative   Lives with husband, daughter and granddaughter.;   Patient drinks about 3 cups of caffeine daily.   Patient is left handed.    Social Determinants of Health   Financial Resource Strain: Low Risk  (03/28/2022)   Overall Financial Resource Strain (CARDIA)     Difficulty of Paying Living Expenses: Not hard at all  Food Insecurity: No Food Insecurity (09/25/2022)   Hunger Vital Sign    Worried About Running Out of Food in the Last Year: Never true    Ran Out of Food in the Last Year: Never true  Transportation Needs: No Transportation Needs (09/20/2022)   PRAPARE - Hydrologist (Medical): No    Lack of Transportation (Non-Medical): No  Physical Activity: Insufficiently Active (03/28/2022)   Exercise Vital Sign    Days of Exercise per Week: 2 days    Minutes of Exercise per Session: 20 min  Stress: No Stress Concern Present (03/28/2022)   [place]  [employer] of Occupational Health - Occupational Stress Questionnaire    Feeling of Stress : Not at all  Social Connections: Moderately Integrated (09/20/2022)   Social Connection and Isolation Panel [NHANES]    Frequency of Communication with Friends and Family: More than three times a week    Frequency of Social Gatherings with Friends and Family: Once a week    Attends Religious Services: 1 to 4 times per year    Active Member of [employer] or Organizations: No    Attends Archivist Meetings: Never    Marital Status: Married   Allergies  Allergen Reactions   Lotrel [Amlodipine Besy-Benazepril Hcl] Anaphylaxis   Contrast Media [Iodinated Contrast Media] Swelling and Other (See Comments)    Causes neck swelling    Niacin Hives   Sulfa Antibiotics Hives    Medications   (Not in a hospital admission)     Current Facility-Administered Medications:    levETIRAcetam (KEPPRA) IVPB 1000 mg/100 mL premix, 1,000 mg, Intravenous, Once **FOLLOWED BY** [COMPLETED] levETIRAcetam (KEPPRA) IVPB 1000 mg/100 mL premix, 1,000 mg, Intravenous, Once, Derek Jack, MD, Stopped at 11/19/22 1357  Current Outpatient Medications:    ACCU-CHEK GUIDE test strip, USE TO CHECK BLOOD SUGAR UP TO 4 TIMES A DAY AS DIRECTED, Disp: 100 strip, Rfl: 1   atorvastatin (LIPITOR) 40 MG tablet,  TAKE ONE TABLET BY MOUTH EVERY MORNING, Disp: 90 tablet, Rfl: 0   blood glucose meter kit and supplies, Dispense Accu-Chek Guide. Use up to four times daily as directed. (FOR ICD-10 E11.22 and N18.32)., Disp: 1 each, Rfl: 0   calcitRIOL (ROCALTROL) 0.25 MCG capsule, Take 0.25 mcg by mouth daily., Disp: , Rfl:    copper tablet, Take 1 tablet (2 mg total) by mouth daily., Disp: , Rfl:    cyanocobalamin (VITAMIN B12) 500 MCG tablet, Take 1 tablet (500 mcg total) by mouth daily., Disp: , Rfl:    diazoxide (PROGLYCEM) 50 MG/ML suspension, Take 1 mL (50 mg total) by mouth every 8 (eight) hours., Disp: 30 mL, Rfl: 12   gabapentin (NEURONTIN) 300 MG capsule, Take 1 capsule (300 mg total) by mouth 3 (three) times daily., Disp: 270 capsule, Rfl: 1   Glucagon (GVOKE HYPOPEN 2-PACK) 1 MG/0.2ML SOAJ, Inject 0.2 mLs into the skin as needed., Disp: 0.2 mL, Rfl: 2   hydrOXYzine (VISTARIL) 100 MG capsule, Take 1 capsule (100 mg total) by mouth 3 (three) times daily., Disp: 270 capsule, Rfl: 1   linaclotide (LINZESS) 290 MCG CAPS capsule, TAKE 1 CAPSULE BY MOUTH EVERY DAY BEFORE BREAKFAST, Disp: 90 capsule, Rfl: 1   metoprolol succinate (TOPROL-XL) 25 MG 24 hr tablet, TAKE 1 TABLET (25 MG TOTAL) BY MOUTH DAILY., Disp: 90 tablet, Rfl: 1   mirtazapine (REMERON) 30 MG tablet, TAKE ONE TABLET BY MOUTH EVERYDAY AT BEDTIME, Disp: 30 tablet, Rfl: 1   omeprazole (PRILOSEC) 20 MG capsule, Take 1 capsule (20 mg total) by mouth daily., Disp: 90 capsule, Rfl: 1   ondansetron (ZOFRAN) 4 MG tablet, TAKE 1 TABLET BY MOUTH EVERY 8 HOURS AS NEEDED (Patient taking differently: Take 4 mg by mouth every 8 (eight) hours as needed for nausea or vomiting.), Disp: 30 tablet, Rfl: 2   polyethylene glycol powder (MIRALAX) 17 GM/SCOOP powder, Take 1 scoop or 17 g 3 times a day for 2 days then use 1 scoop twice a day a day until you begin having soft bowel movements then cut back to 1 scoop a day., Disp: 255 g, Rfl: 0  rivastigmine (EXELON) 3 MG  capsule, Take 3 mg by mouth 2 (two) times daily., Disp: , Rfl:    traMADol (ULTRAM) 50 MG tablet, Take 1 tablet (50 mg total) by mouth every 6 (six) hours as needed for moderate pain or severe pain., Disp: 100 tablet, Rfl: 3   zinc gluconate 50 MG tablet, Take 1 tablet (50 mg total) by mouth daily., Disp: , Rfl:   Vitals   Vitals:   11/19/22 1345 11/19/22 1400 11/19/22 1430 11/19/22 1500  BP: 134/66 (!) 159/72 121/63 119/60  Pulse: 61 (!) 53 (!) 59 (!) 57  Resp: (!) 23 (!) _0 Temp:      TempSrc:      SpO2: 94% 99% 100% 100%  Weight:      Height:         Body mass index is 22.34 kg/m.  Physical Exam   Physical Exam Gen: drowsy, oriented to self only HEENT: Atraumatic, normocephalic;mucous membranes moist; oropharynx clear, tongue without atrophy or fasciculations. Neck: Supple, trachea midline. Resp: CTAB, no w/r/r CV: RRR, no m/g/r; nml S1 and S2. 2+ symmetric peripheral pulses. Abd: soft/NT/ND; nabs x 4 quad Extrem: Nml bulk; no cyanosis, clubbing, or edema.  Neuro: *MS: drowsy, oriented to self only *Speech: mild dysarthria, UTA name and repeat *CN: PERRL, blinks to threat bilat, R gaze pref resolved with ativan, face symmetric at rest *Motor: oral automatisms, LUE pain-limited, RUE no drift, drift to bed BUE L>R *Sensory: SILT *Coordination: UTA *Reflexes: 1+ symm throughout, toes mute *Gait: UTA  Labs   CBC:  Recent Labs  Lab 11/19/22 1219 11/19/22 1220  WBC 5.5  --   HGB 10.8* 11.6*  HCT 33.8* 34.0*  MCV 85.4  --   PLT 247  --     Basic Metabolic Panel:  Lab Results  Component Value Date   NA 144 11/19/2022   K 3.7 11/19/2022   CO2 27 11/19/2022   GLUCOSE 83 11/19/2022   BUN 11 11/19/2022   CREATININE 1.50 (H) 11/19/2022   CALCIUM 8.9 11/19/2022   GFRNONAA 33 (L) 11/19/2022   GFRAA 42 (L) 12/05/2020   Lipid Panel:  Lab Results  Component Value Date   LDLCALC 63 09/06/2021   HgbA1c:  Lab Results  Component Value Date   HGBA1C 5.4  08/09/2022   Urine Drug Screen:     Component Value Date/Time   LABOPIA NONE DETECTED 09/05/2021 1700   LABOPIA NONE DETECTED 03/18/2018 2157   COCAINSCRNUR NONE DETECTED 09/05/2021 1700   LABBENZ NONE DETECTED 09/05/2021 1700   LABBENZ NONE DETECTED 03/18/2018 2157   AMPHETMU NONE DETECTED 09/05/2021 1700   AMPHETMU NONE DETECTED 03/18/2018 2157   THCU NONE DETECTED 09/05/2021 1700   THCU NONE DETECTED 03/18/2018 2157   LABBARB NONE DETECTED 09/05/2021 1700   LABBARB NONE DETECTED 03/18/2018 2157    Alcohol Level     Component Value Date/Time   [facility] <10 11/19/2022 1219     Impression   Julie Acevedo is a 71 y.o. female with PMH significant for  has a past medical history of Anemia, Arthritis, COVID-19 (11/19/2019), Diverticulitis, DM (diabetes mellitus) ([place]), History of methicillin resistant staphylococcus aureus (MRSA) (2017), HTN (hypertension), Hyperlipidemia, Memory difficulties (09/01/2015), Multiple thyroid nodules, Myocardial infarction ([place]) (1995), Short-term memory loss, and Thyroid nodule. who presents with clinical focal status epilepticus, resolved with ativan. 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 Per husband has not had a seizure recently, not sure when last  one was, thought she was on seizure medication but there is none on her list or in her medicine bag other than gabapentin 358m tid. CT head NAICP. STAT EEG after ativan showed no nonconvulsive seizure activity.   Recommendations   - S/p 3.5g keppra load, cont 5052mbid - Ativan 52m28mV prn for seizures, page neuro if given - Cont gabapentin home dose - Continue LTM - MRI brain wwo when LTM d/c'd. Seizure provoked in setting of head trauma but will obtain MRI to r/o additional structural abn since she has not had seizure in yrs - Seizure precautions, will continue to follow ______________________________________________________________________   Thank you for the opportunity to take  part in the care of this patient. If you have any further questions, please contact the neurology consultation attending.  Signed,  ColSu MonksD [facility] [phone] 7pm- 7am, please page neurology on call as listed in [place]**Any copied and pasted documentation in this note was written by me in another application not billed for and pasted by me into this document.

## 2022-11-20 ENCOUNTER — Ambulatory Visit: Payer: Self-pay | Admitting: *Deleted

## 2022-11-20 DIAGNOSIS — R569 Unspecified convulsions: Secondary | ICD-10-CM | POA: Diagnosis not present

## 2022-11-20 LAB — URINALYSIS, ROUTINE W REFLEX MICROSCOPIC
Bilirubin Urine: NEGATIVE
Glucose, UA: 50 mg/dL — AB
Hgb urine dipstick: NEGATIVE
Ketones, ur: NEGATIVE mg/dL
Leukocytes,Ua: NEGATIVE
Nitrite: NEGATIVE
Protein, ur: NEGATIVE mg/dL
Specific Gravity, Urine: 1.014 (ref 1.005–1.030)
pH: 7 (ref 5.0–8.0)

## 2022-11-20 LAB — CBC
HCT: 36.1 % (ref 36.0–46.0)
Hemoglobin: 11 g/dL — ABNORMAL LOW (ref 12.0–15.0)
MCH: 26.9 pg (ref 26.0–34.0)
MCHC: 30.5 g/dL (ref 30.0–36.0)
MCV: 88.3 fL (ref 80.0–100.0)
Platelets: 223 10*3/uL (ref 150–400)
RBC: 4.09 MIL/uL (ref 3.87–5.11)
RDW: 13.2 % (ref 11.5–15.5)
WBC: 6.1 10*3/uL (ref 4.0–10.5)
nRBC: 0 % (ref 0.0–0.2)

## 2022-11-20 LAB — CBG MONITORING, ED
Glucose-Capillary: 146 mg/dL — ABNORMAL HIGH (ref 70–99)
Glucose-Capillary: 183 mg/dL — ABNORMAL HIGH (ref 70–99)
Glucose-Capillary: 58 mg/dL — ABNORMAL LOW (ref 70–99)
Glucose-Capillary: 98 mg/dL (ref 70–99)

## 2022-11-20 LAB — BASIC METABOLIC PANEL
Anion gap: 9 (ref 5–15)
BUN: 11 mg/dL (ref 8–23)
CO2: 23 mmol/L (ref 22–32)
Calcium: 8.8 mg/dL — ABNORMAL LOW (ref 8.9–10.3)
Chloride: 109 mmol/L (ref 98–111)
Creatinine, Ser: 1.46 mg/dL — ABNORMAL HIGH (ref 0.44–1.00)
GFR, Estimated: 39 mL/min — ABNORMAL LOW (ref >60)
Glucose, Bld: 94 mg/dL (ref 70–99)
Potassium: 3.8 mmol/L (ref 3.5–5.1)
Sodium: 141 mmol/L (ref 135–145)

## 2022-11-20 LAB — GLUCOSE, CAPILLARY
Glucose-Capillary: 89 mg/dL (ref 70–99)
Glucose-Capillary: 125 mg/dL — ABNORMAL HIGH (ref 70–99)
Glucose-Capillary: 144 mg/dL — ABNORMAL HIGH (ref 70–99)

## 2022-11-20 LAB — PHOSPHORUS: Phosphorus: 3.4 mg/dL (ref 2.5–4.6)

## 2022-11-20 LAB — MAGNESIUM: Magnesium: 1.8 mg/dL (ref 1.7–2.4)

## 2022-11-20 MED ORDER — ONDANSETRON HCL 4 MG/2ML IJ SOLN: 4.0000 mg | Freq: Four times a day (QID) | INTRAMUSCULAR | Status: DC | PRN

## 2022-11-20 MED ORDER — LEVETIRACETAM 500 MG PO TABS
500.0000 mg | ORAL_TABLET | Freq: Two times a day (BID) | ORAL | Status: DC
  Administered 2022-11-20 – 2022-11-21 (×3): 500 mg via ORAL
  Filled 2022-11-20 (×3): qty 1

## 2022-11-20 NOTE — Progress Notes (Signed)
Pt pulled off all leads, rehooked up.

## 2022-11-20 NOTE — Progress Notes (Signed)
Pt moved to the floor placed pt back on LTM. [facility] now monitoring.

## 2022-11-20 NOTE — Procedures (Signed)
EEG Procedure CPT/Type of Study: 83151; 24hr EEG with video Referring Provider: Dante Gang Primary Neurological Diagnosis: AMS  History: This is a 71 yr old patient, undergoing an EEG to evaluate for AMS, seizures. Clinical State: disoriented  Technical Description:  The EEG was performed using standard setting per the guidelines of American Clinical Neurophysiology Society (ACNS).  A minimum of 21 electrodes were placed on scalp according to the International 10-20 or/and 10-10 Systems. Supplemental electrodes were placed as needed. Single EKG electrode was also used to detect cardiac arrhythmia. Patient's behavior was continuously recorded on video simultaneously with EEG. A minimum of 16 channels were used for data display. Each epoch of study was reviewed manually daily and as needed using standard referential and bipolar montages. Computerized quantitative EEG analysis (such as compressed spectral array analysis, trending, automated spike & seizure detection) were used as indicated.   Day 1: from 1409 11/19/22 to 0730 12/5/236  EEG Description: Overall Amplitude:Normal Predominant Frequency: The background activity showed posterior dominant alpha, with about 9 Hz, that was occasional. Superimposed Frequencies: frequent theta and some beta activity bilaterally The background was symmetric  Background Abnormalities: None Rhythmic or periodic pattern: No Epileptiform activity: no Electrographic seizures: no Events: no   Breach rhythm: no  Reactivity: Present  Stimulation procedures:  Hyperventilation: not done Photic stimulation: no change  Sleep Background: Stage II  EKG:no significant arrhythmia  Impression: This was an abnormal continuous video EEG due to moderate background slowing, indicative of a non-specific encephalopathy pattern. No seizures or epileptiform discharges were seen.

## 2022-11-20 NOTE — Progress Notes (Signed)
LTM maint complete - no skin breakdown under: CZ,02 pt had been DC from the recording and needed to rehook.

## 2022-11-20 NOTE — Progress Notes (Signed)
TRH night cross cover note:   I was notified by RN of HR's in the high 40's, compared to HR's in the 50's to low 60's during day shift, at which time BB was administered at 1700. I d\c  order for scheduled oral metoprolol.  Blood pressure tolerated, without any documented hypotension.    Update: at 0640 AM: I ordered as needed IV Zofran for nausea.   Babs Bertin, DO Hospitalist

## 2022-11-20 NOTE — Progress Notes (Addendum)
S: Patient back to baseline mental status  No nonconvulsive seizures on EEG overnight  O:  Vitals:   11/20/22 1141 11/20/22 1624  BP: (!) 153/64 (!) 136/56  Pulse: (!) 50 (!) 48  Resp: 16 16  Temp: 97.8 F (36.6 C) 98.1 F (36.7 C)  SpO2: 100% 99%    Physical Exam Gen: A&Ox4, NAD HEENT: Atraumatic, normocephalic; oropharynx clear, tongue without atrophy or fasciculations. Resp: CTAB, normal work of breathing CV: RRR, extremities appear well-perfused. Abd: soft/NT/ND Extrem: Nml bulk; no cyanosis, clubbing, or edema.  Neuro: *MS: A&O x4. Follows multi-step commands.  *Speech: no dysarthria or aphasia, able to name and repeat. *CN:    I: Deferred   II,III: PERRLA, VFF by confrontation, optic discs not visualized 2/2 pupillary constriction   III,IV,VI: EOMI w/o nystagmus, no ptosis   V: Sensation intact from V1 to V3 to LT   VII: Eyelid closure was full.  Smile symmetric.   VIII: Hearing intact to voice   IX,X: Voice normal, palate elevates symmetrically    XI: SCM/trap 5/5 bilat   XII: Tongue protrudes midline, no atrophy or fasciculations  *Motor:   Normal bulk.  No tremor, rigidity or bradykinesia. Strength 4+/5 diffusely *Sensory: SILT *Coordination:  FNF intact bilat *Reflexes:  2+ and symmetric throughout without clonus; toes down-going bilat *Gait: deferred  A/P: 71 yo woman with hx mild dementia, prior seizures not on AED other than gabapentin 300mg  tid who presented with clinical focal status resolved with ativan and keppra load. No nonconvulsive seizures on EEG. - OK to d/c LTM EEG if no seizures overnight - MRI brain wwo after LTM d/c'd - Continue keppra 500mg  bid, cont gabapentin home dose - Pt counseled not to drive x6 mos after last seizure - Will continue to follow  Bing Neighbors, MD [facility] [phone]  If 7pm- 7am, please page neurology on call as listed in [place].

## 2022-11-20 NOTE — ED Notes (Addendum)
Trauma Response Nurse Documentation   Julie Acevedo is a 71 y.o. female arriving to [facility] ED via EMS  On No antithrombotic. Trauma was activated as a Level 2 based on the following trauma criteria GCS 10-14 associated with trauma or AVPU < A. Trauma team at the bedside on patient arrival.   Patient cleared for CT by Dr. Ronnald Nian. Pt transported to CT with trauma response nurse present to monitor. RN remained with the patient throughout their absence from the department for clinical observation. GCS 14.  History   Past Medical History:  Diagnosis Date   Anemia    Arthritis    COVID-19 11/19/2019   Diverticulitis    DM (diabetes mellitus) ([place])    typ e 2   History of methicillin resistant staphylococcus aureus (MRSA) 2017   HTN (hypertension)    Hyperlipidemia    Memory difficulties 09/01/2015   Multiple thyroid nodules    Myocardial infarction ([place]) 1995   Short-term memory loss    Thyroid nodule      Past Surgical History:  Procedure Laterality Date   ABDOMINAL HYSTERECTOMY     due to endometriosis-1 ovary left   BREAST EXCISIONAL BIOPSY Right 1971   neg   CARDIAC CATHETERIZATION  2000   no significant CAD per note of Dr Caryl Comes in 2013    [place] N/A 03/19/2017   Procedure: ANTERIOR REPAIR (CYSTOCELE);  Surgeon: Bjorn Loser, MD;  [facility];  Service: Urology;  Laterality: N/A;   CYSTOSCOPY N/A 03/19/2017   Procedure: CYSTOSCOPY;  Surgeon: Bjorn Loser, MD;  [facility];  Service: Urology;  Laterality: N/A;   ESOPHAGOGASTRODUODENOSCOPY (EGD) WITH PROPOFOL N/A 12/15/2021   Procedure: ESOPHAGOGASTRODUODENOSCOPY (EGD) WITH PROPOFOL;  Surgeon: Lesly Rubenstein, MD;  Location: [facility];  Service: Endoscopy;  Laterality: N/A;   EYE SURGERY     FOOT SURGERY     HAND SURGERY     HEMICOLECTOMY     INSERTION OF MESH N/A 08/21/2016   Procedure: INSERTION OF MESH;  Surgeon: Excell Seltzer, MD;  [facility];   Service: General;  Laterality: N/A;   INSERTION OF MESH N/A 12/08/2019   Procedure: INSERTION OF MESH;  Surgeon: Jules Husbands, MD;  Location: [facility];  Service: General;  Laterality: N/A;   LAPAROSCOPIC LYSIS OF ADHESIONS N/A 08/21/2016   Procedure: LAPAROSCOPIC LYSIS OF ADHESIONS;  Surgeon: Excell Seltzer, MD;  [facility];  Service: General;  Laterality: N/A;   LAPAROSCOPIC REMOVAL ABDOMINAL MASS     LAPAROTOMY N/A 04/16/2019   Procedure: EXPLORATORY LAPAROTOMY;  Surgeon: Benjamine Sprague, DO;  Location: [facility];  Service: General;  Laterality: N/A;   LOOP RECORDER INSERTION N/A 03/21/2018   Procedure: LOOP RECORDER INSERTION;  Surgeon: Sanda Klein, MD;  Location: [place] [facility];  Service: Cardiovascular;  Laterality: N/A;   LUMBAR DISC SURGERY     ROUX-EN-Y GASTRIC BYPASS  2010   Dr Hassell Done   SMALL INTESTINE SURGERY     TONSILLECTOMY     UPPER GI ENDOSCOPY  01/12/2016   normal larynx, normal esophagus. gastric bypass with a normal-sized pouch and intact staple line, normal examined jejunum, otherwise exam was normal   VENTRAL HERNIA REPAIR N/A 08/21/2016   Procedure: LAPAROSCOPIC VENTRAL HERNIA;  Surgeon: Excell Seltzer, MD;  [facility];  Service: General;  Laterality: N/A;   XI ROBOTIC ASSISTED VENTRAL HERNIA N/A 12/08/2019   Procedure: XI ROBOTIC ASSISTED VENTRAL HERNIA;  Surgeon: Jules Husbands,  MD;  Location: [facility];  Service: General;  Laterality: N/A;     Initial Focused Assessment (If applicable, or please see trauma documentation): See charting for full assessment  CT's Completed:   CT Head and CT C-Spine   Interventions:  CT Head/Cspine IV Ativan Keppra  Plan for disposition:  Admission to floor   Consults completed:  Neurology at 1255.  Event Summary: Patient to ED via EMS after hitting her head. Per husband and daughter, patient was at baseline heath this morning and was cleaning as she usually does on Mondays. Patient was changing a  trash bag when she stood up and hit her head on a towel rack, she did not fall. Daughter witnessed this and assisted her to the couch where she had a period of minimal responsiveness and was non-verbal. Husband also reports she had a period of "shaking". She is a diabetic and can become hypoglycemic but says usually she feels that coming on, with EMS CBG was in the 80s. On my assessment patient was still mostly non-verbal but able to tell me her name and where she is, not sure events leading up to bringing her to the hospital. Per husband she is much different than her baseline, has a "touch" of dementia and will occasionally be confused.   When taking patient to CT patient began rhythmic lip smacking. I noticed a slight facial droop and L side weakness. Patient able to report she feels some decreased sensation on the L. Stroke nurse Julie Acevedo called for assessment, Neurology MD Julie Acevedo called by Julie Acevedo. On Dr Julie Acevedo assessment she find patient is unable to overcome the lip smacking even on request. Patient reports dry mouth but continues smacking. Order for '2mg'$  Ativan which was given at 1258 and a few minutes later patient was able to keep her mouth still. Dr Julie Acevedo found a distance history of status in patients chart and ordered '3500mg'$  Keppra. Patient no longer takes any antiepileptic because no seizure for many years. EEG ordered, admission to hospitalist with neurology consult.  Bedside handoff with ED RN Julie Acevedo.    Julie Acevedo  Trauma Response RN  Please call TRN at [phone] for further assistance.

## 2022-11-20 NOTE — Progress Notes (Signed)
PROGRESS NOTE    Julie Acevedo  HYI:[id number] DOB: 06-07-1951 DOA: 11/19/2022 PCP: Jerrol Banana., MD    Brief Narrative:  Julie Acevedo is a 71 y.o. female with medical history significant of mild [name] but functional at home, she drives and shops, has history of hypertension and hyperlipidemia presents to the emergency room with fall at home.  She was cleaning her bathroom, when she stood up she hit towel bar and then fell on the tile.  She developed hematoma left forehead, was complaining of left shoulder pain.  After injury she was able to get up and walk to the couch.  Appeared more confused than usual so they called EMS and she was brought to emergency room.  Apparently, for a skeletal survey, patient was sent to CT scan and she was noted to have smacking her lips continuously that was evaluated by neurology at the bedside and recommended to treat for focal seizure.  Skeletal survey negative including CT scan of the head and CT scan of the cervical spine as well as left shoulder.    Assessment & Plan:   Seizure disorder: Presented with a witnessed seizure at the CT scan by neurology.  Loaded with Keppra.  Currently on Keppra 500 mg twice daily, will change to oral today.  Seizure precautions.  Fall precautions.  MRI after completing LTM.   Fall with soft tissue injuries: Symptomatic management.  Start mobilizing today. Skeletal survey was negative.   Diet-controlled type 2 diabetes with risk of hypoglycemia: We will keep patient on D5 half-normal saline infusion.  Monitor blood sugars very closely.  Allow regular diet.   Essential hypertension: Blood pressures well controlled.   Lewi body dementia: She still drives and shops.  With evidence of seizures, she will have driving restrictions.   Recommended [place] DMV statutes, patients with seizures are not allowed to drive until they have been seizure-free for six months. Use caution when using heavy equipment or power  tools. Avoid working on ladders or at heights. Take showers instead of baths. Ensure the water temperature is not too high on the home water heater. Do not go swimming alone. Do not lock yourself in a room alone (i.e. bathroom). Maintain good sleep hygiene. Avoid alcohol.    DVT prophylaxis: heparin injection 5,000 Units Start: 11/19/22 1630 SCDs Start: 11/19/22 1626   Code Status: Full code Family Communication: Husband at the bedside Disposition Plan: Status is: Inpatient Remains inpatient appropriate because: Currently on continuous EEG monitoring.     Consultants:  Neurology  Procedures:  EEG  Antimicrobials:  None   Subjective: Patient seen and examined.  Arrived to medical floor.  Mostly slept all night.  She is awake.  She denies any complaints.  Denies any headache nausea vomiting.  She ate her breakfast. EEG so far does not show any seizure activities. Husband wants her to go home as soon as possible. Blood sugars low normal, currently on dextrose infusion.  We will continue until she eats well.  Objective: Vitals:   11/20/22 0745 11/20/22 0830 11/20/22 0921 11/20/22 1141  BP: (!) 149/59 (!) 166/76 (!) 169/60 (!) 153/64  Pulse: (!) 46 (!) 47 (!) 49 (!) 50  Resp: '13 14 16 16  '$ Temp:   97.9 F (36.6 C) 97.8 F (36.6 C)  TempSrc:   Oral Oral  SpO2: 100% 100% 100% 100%  Weight:      Height:        Intake/Output Summary (Last 24  hours) at 11/20/2022 1316 Last data filed at 11/20/2022 0851 Gross per 24 hour  Intake 800 ml  Output 500 ml  Net 300 ml   Filed Weights   11/19/22 1216  Weight: 57.2 kg    Examination:  General exam: Appears calm and comfortable  Alert vented.  Responds appropriately.  Moves all extremities. Patient has some ecchymosis on her face. Respiratory system: Clear to auscultation. Respiratory effort normal.  No added sounds. Cardiovascular system: S1 & S2 heard, RRR. No pedal edema. Gastrointestinal system: Abdomen is nondistended,  soft and nontender. No organomegaly or masses felt. Normal bowel sounds heard. Central nervous system: Alert and oriented. No focal neurological deficits. Extremities: Symmetric 5 x 5 power. Skin: No rashes, lesions or ulcers   Data Reviewed: I have personally reviewed following labs and imaging studies  CBC: Recent Labs  Lab 11/19/22 1219 11/19/22 1220 11/20/22 0329  WBC 5.5  --  6.1  HGB 10.8* 11.6* 11.0*  HCT 33.8* 34.0* 36.1  MCV 85.4  --  88.3  PLT 247  --  850   Basic Metabolic Panel: Recent Labs  Lab 11/19/22 1219 11/19/22 1220 11/20/22 0329  NA 141 144 141  K 3.6 3.7 3.8  CL 108 107 109  CO2 27  --  23  GLUCOSE 85 83 94  BUN '10 11 11  '$ CREATININE 1.65* 1.50* 1.46*  CALCIUM 8.9  --  8.8*  MG  --   --  1.8  PHOS  --   --  3.4   GFR: Estimated Creatinine Clearance: 30.1 mL/min (A) (by C-G formula based on SCr of 1.46 mg/dL (H)). Liver Function Tests: Recent Labs  Lab 11/19/22 1219  AST 37  ALT 36  ALKPHOS 151*  BILITOT 0.1*  PROT 6.7  ALBUMIN 3.5   No results for input(s): "LIPASE", "AMYLASE" in the last 168 hours. No results for input(s): "AMMONIA" in the last 168 hours. Coagulation Profile: Recent Labs  Lab 11/19/22 1219  INR 1.1   Cardiac Enzymes: No results for input(s): "CKTOTAL", "CKMB", "CKMBINDEX", "TROPONINI" in the last 168 hours. BNP (last 3 results) No results for input(s): "PROBNP" in the last 8760 hours. HbA1C: No results for input(s): "HGBA1C" in the last 72 hours. CBG: Recent Labs  Lab 11/20/22 0103 11/20/22 0432 11/20/22 0523 11/20/22 0742 11/20/22 1142  GLUCAP 146* 58* 183* 98 89   Lipid Profile: No results for input(s): "CHOL", "HDL", "LDLCALC", "TRIG", "CHOLHDL", "LDLDIRECT" in the last 72 hours. Thyroid Function Tests: No results for input(s): "TSH", "T4TOTAL", "FREET4", "T3FREE", "THYROIDAB" in the last 72 hours. Anemia Panel: No results for input(s): "VITAMINB12", "FOLATE", "FERRITIN", "TIBC", "IRON",  "RETICCTPCT" in the last 72 hours. Sepsis Labs: No results for input(s): "PROCALCITON", "LATICACIDVEN" in the last 168 hours.  No results found for this or any previous visit (from the past 240 hour(s)).       Radiology Studies: Overnight EEG with video  Result Date: 11/20/2022 Samuella Cota, MD     11/20/2022  8:46 AM EEG Procedure CPT/Type of Study: 27741; 24hr EEG with video Referring Provider: Dante Gang Primary Neurological Diagnosis: AMS History: This is a 71 yr old patient, undergoing an EEG to evaluate for AMS, seizures. Clinical State: disoriented Technical Description: The EEG was performed using standard setting per the guidelines of American Clinical Neurophysiology Society (ACNS). A minimum of 21 electrodes were placed on scalp according to the International 10-20 or/and 10-10 Systems. Supplemental electrodes were placed as needed. Single EKG electrode was also used to detect  cardiac arrhythmia. Patient's behavior was continuously recorded on video simultaneously with EEG. A minimum of 16 channels were used for data display. Each epoch of study was reviewed manually daily and as needed using standard referential and bipolar montages. Computerized quantitative EEG analysis (such as compressed spectral array analysis, trending, automated spike & seizure detection) were used as indicated. Day 1: from 1409 11/19/22 to 0730 12/5/236 EEG Description: Overall Amplitude:Normal Predominant Frequency: The background activity showed posterior dominant alpha, with about 9 Hz, that was occasional. Superimposed Frequencies: frequent theta and some beta activity bilaterally The background was symmetric Background Abnormalities: None Rhythmic or periodic pattern: No Epileptiform activity: no Electrographic seizures: no Events: no Breach rhythm: no Reactivity: Present Stimulation procedures: Hyperventilation: not done Photic stimulation: no change Sleep Background: Stage II EKG:no significant arrhythmia Impression:  This was an abnormal continuous video EEG due to moderate background slowing, indicative of a non-specific encephalopathy pattern. No seizures or epileptiform discharges were seen.   EEG adult  Result Date: 11/19/2022 Spero Curb, MD     11/19/2022  4:54 PM [facility] Routine EEG Report Video Performed: Performed Demographics: Patient Name:   Saramarie, Stinger Date of Birth:   Aug 12, 1951 Identification Number:   MRN - [id number] Study Times: Study Start Time:   11/19/2022 13:16:00 Study End Time:   11/19/2022 13:53:00 Indication(s): Spells, Eval for Seizures Medication: Keppra, gabapentin Technical Summary: This EEG was performed utilizing standard International 10-20 System of electrode placement. One channel electrocardiogram was monitored. Data were obtained, stored, and interpreted utilizing referential montage recording, with reformatting to longitudinal, transverse bipolar, and referential montages as necessary for interpretation. State(s):       Awake      Drowsy      Asleep Activation Procedures: Hyperventilation: Not performed Photic Stimulation: Not performed EEG Description: Background showed a reduction of 10 Hz posterior dominant alpha rhythm. The posterior dominant alpha rhythm is non-sustained and intermixed with diffuse fast activity and theta slow 6-7 Hz.  The amplitudes were 20--30 uV.  There was no normal sleep structures observed.  There was no clinical event noted.  Throughout the record, there was no epileptiform abnormality or lateralizing sign observed.  Intermittent myogenic and movement artifacts were noted. Impression: This is an abnormal EEG indicative of a mild to moderate diffuse encephalopathy. This is non specific in etiology and could be from benzodiazepine effect (Ativan).  No epileptiform abnormality, electrographic seizures or evidence of status epilepticus were present. No lateralizing sign is observed. Dr Spero Curb  [facility] For Inpatient follow-up with [facility] physician please call [facility] [phone]. This is not an outpatient service. Post hospital discharge, please contact hospital directly.   DG Shoulder Left Port  Result Date: 11/19/2022 CLINICAL DATA:  Fall, left shoulder pain EXAM: LEFT SHOULDER COMPARISON:  None Available. FINDINGS: No fracture. No glenohumeral dislocation. No evidence of acromioclavicular separation. Mild AC joint osteoarthritis. No significant glenohumeral arthropathy. No suspicious focal osseous lesions. Loop recorder overlies the left chest. IMPRESSION: No fracture or malalignment in the left shoulder. Mild AC joint osteoarthritis. Electronically Signed   By: Ilona Sorrel M.D.   On: 11/19/2022 15:22   CT HEAD WO CONTRAST  Result Date: 11/19/2022 CLINICAL DATA:  Trauma, fall EXAM: CT HEAD WITHOUT CONTRAST TECHNIQUE: Contiguous axial images were obtained from the base of the skull through the vertex without intravenous contrast. RADIATION DOSE REDUCTION: This exam was performed according to the departmental dose-optimization program which includes automated exposure control, adjustment of the mA and/or kV according to  patient size and/or use of iterative reconstruction technique. COMPARISON:  08/12/2022 FINDINGS: Brain: No acute intracranial findings are seen in noncontrast CT brain. There are no signs of bleeding within the cranium. Minimal calcifications are seen in basal ganglia. Cortical sulci are prominent. Scattered dural calcifications are seen. Vascular: Unremarkable. Skull: No fracture is seen in calvarium. There is subcutaneous contusion/hematoma in the left frontal scalp. Sinuses/Orbits: There are no air-fluid levels in paranasal sinuses. There is a old small blowout fracture in the floor of left orbit with no interval change. There is no herniation of orbital contents. Other: None. IMPRESSION: No acute intracranial findings are seen in noncontrast CT brain. Atrophy.  No fracture is seen in calvarium. Electronically Signed   By: Elmer Picker M.D.   On: 11/19/2022 13:03   CT CERVICAL SPINE WO CONTRAST  Result Date: 11/19/2022 CLINICAL DATA:  Trauma EXAM: CT CERVICAL SPINE WITHOUT CONTRAST TECHNIQUE: Multidetector CT imaging of the cervical spine was performed without intravenous contrast. Multiplanar CT image reconstructions were also generated. RADIATION DOSE REDUCTION: This exam was performed according to the departmental dose-optimization program which includes automated exposure control, adjustment of the mA and/or kV according to patient size and/or use of iterative reconstruction technique. COMPARISON:  Cervical spine radiographs done on 08/08/2022 and CT cervical spine done on 09/14/2016 FINDINGS: Alignment: There is minimal 1-2 mm retrolisthesis at C5-C6 level. This finding has not changed. Skull base and vertebrae: No recent fracture is seen. Degenerative changes are noted with disc space narrowing and bony spurs at multiple levels. Soft tissues and spinal canal: There is no central spinal stenosis. Posterior bony spurs are causing extrinsic pressure over the ventral margin of thecal sac at multiple levels. Disc levels: There is encroachment of neural foramina by bony spurs from C3-C7 levels. Upper chest: Unremarkable. Other: There is inhomogeneous attenuation in thyroid. There is 1.8 cm low-density nodule in the posterior right lobe. These findings have not changed significantly. IMPRESSION: No recent fracture is seen in cervical spine. Cervical spondylosis with encroachment of neural foramina from C3-C7 levels. There is inhomogeneous attenuation in thyroid with 1.8 cm nodule in the right lobe. Similar finding was seen in previous CT done on 09/14/2016. Electronically Signed   By: Elmer Picker M.D.   On: 11/19/2022 12:58   DG Chest Portable 1 View  Result Date: 11/19/2022 CLINICAL DATA:  History of fall in a 71 year old female. Level 2 trauma. EXAM:  PORTABLE CHEST 1 VIEW COMPARISON:  August 21, 2022 FINDINGS: EKG leads project over the chest. Cardiac loop recorder projects over the LEFT chest. Trachea midline. Cardiomediastinal contours and hilar structures are normal. Lungs are clear. No pneumothorax or sign of pleural effusion on AP portable radiograph. On limited assessment no acute skeletal process. IMPRESSION: 1. No acute cardiopulmonary disease. 2. Cardiac loop recorder in place. Electronically Signed   By: Zetta Bills M.D.   On: 11/19/2022 12:25        Scheduled Meds:  atorvastatin  40 mg Oral q morning   gabapentin  300 mg Oral TID   heparin  5,000 Units Subcutaneous Q8H   levETIRAcetam  500 mg Oral BID   mirtazapine  30 mg Oral QHS   pantoprazole  40 mg Oral Daily   cyanocobalamin  500 mcg Oral Daily   Continuous Infusions:  dextrose 5 % and 0.45% NaCl 75 mL/hr at 11/20/22 0643     LOS: 1 day    Time spent: 35 minutes    Barb Merino, MD [facility] Pager [phone]

## 2022-11-20 NOTE — Patient Outreach (Signed)
  Care Coordination   Follow Up Visit Note   11/20/2022 Name: Julie Acevedo MRN: [id number] DOB: [birth year]  Julie Acevedo is a 71 y.o. year old female who sees Jerrol Banana., MD for primary care. I  sent message to hospital liaison  What matters to the patients health and wellness today?  Patient scheduled for outreach today, noted she is currently admitted to hospital.  Hospital liaison notified.    SDOH assessments and interventions completed:  No     Care Coordination Interventions:  No, not indicated   Follow up plan:  Plan to follow up pending hospital discharge.     Encounter Outcome:  No Answer   Valente David, RN, MSN, [place] [facility] Care Management Coordinator [phone]

## 2022-11-20 NOTE — ED Notes (Signed)
Provider made aware of CBG. Order provided for d50

## 2022-11-20 NOTE — ED Notes (Signed)
Pt taken via stretcher with RN to room 3W02C.

## 2022-11-21 ENCOUNTER — Inpatient Hospital Stay (HOSPITAL_COMMUNITY): Payer: Medicare Other

## 2022-11-21 LAB — GLUCOSE, CAPILLARY
Glucose-Capillary: 102 mg/dL — ABNORMAL HIGH (ref 70–99)
Glucose-Capillary: 111 mg/dL — ABNORMAL HIGH (ref 70–99)
Glucose-Capillary: 139 mg/dL — ABNORMAL HIGH (ref 70–99)
Glucose-Capillary: 85 mg/dL (ref 70–99)

## 2022-11-21 MED ORDER — ENSURE MAX PROTEIN PO LIQD
11.0000 [oz_av] | Freq: Every day | ORAL | Status: DC
  Filled 2022-11-21: qty 330

## 2022-11-21 MED ORDER — GADOBUTROL 1 MMOL/ML IV SOLN
5.0000 mL | Freq: Once | INTRAVENOUS | Status: AC | PRN
  Administered 2022-11-21: 5 mL via INTRAVENOUS

## 2022-11-21 MED ORDER — ASPIRIN-ACETAMINOPHEN-CAFFEINE 250-250-65 MG PO TABS: 1.0000 | ORAL_TABLET | Freq: Three times a day (TID) | ORAL | Status: DC | PRN

## 2022-11-21 MED ORDER — LEVETIRACETAM 500 MG PO TABS: 500.0000 mg | ORAL_TABLET | Freq: Two times a day (BID) | ORAL | 3 refills | Status: AC

## 2022-11-21 MED ORDER — ADULT MULTIVITAMIN W/MINERALS CH
1.0000 | ORAL_TABLET | Freq: Every day | ORAL | Status: DC
  Administered 2022-11-21: 1 via ORAL
  Filled 2022-11-21: qty 1

## 2022-11-21 NOTE — Discharge Summary (Signed)
[facility]  Physician Discharge Summary   Patient ID: Julie Acevedo MRN: [id number] DOB/AGE: [birth year] 71 y.o.  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 [name]    Home Health: None Equipment/Devices: None  CODE STATUS: Full code  DISCHARGE CONDITION: fair  Diet recommendation: As before  INITIAL HISTORY: 71 y.o. female with medical history significant of mild [name] but functional at home, she drives and shops, has history of hypertension and hyperlipidemia presented to the emergency room with fall at home.  She was cleaning her bathroom, when she stood up she hit towel bar and then fell on the tile.  She developed hematoma left forehead, was complaining of left shoulder pain.  After injury she was able to get up and walk to the couch.  Appeared more confused than usual so they called EMS and she was brought to emergency room.  Apparently, for a skeletal survey, patient was sent to CT scan and she was noted to have smacking her lips continuously that was evaluated by neurology at the bedside and recommended to treat for focal seizure.  Skeletal survey negative including CT scan of the head and CT scan of the cervical spine as well as left shoulder.    Consultants: Neurology   Procedures: Continuous EEG    HOSPITAL COURSE:   Seizure disorder, new onset Noted to have witnessed seizure by neurology.  Started on Keppra.  Was placed on continuous EEG.  No further seizure activity noted.  Taken off of continuous EEG.  Underwent MRI brain which was unremarkable.  Cleared by neurology for discharge on Keppra.   Fall with  soft tissue injury Symptomatic management.  Skeletal survey was negative.   Diabetes mellitus type 2, diet controlled    Essential hypertension   H/o [name] Seems to be mild.   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  Cleared by neurology for discharge.  PERTINENT LABS:  The results of significant diagnostics from this hospitalization (including imaging, microbiology, ancillary and laboratory) are listed below for reference.     Labs:   Basic Metabolic Panel: Recent Labs  Lab 11/19/22 1219 11/19/22 1220 11/20/22 0329  NA 141 144 141  K 3.6 3.7 3.8  CL 108 107 109  CO2 27  --  23  GLUCOSE 85 83 94  BUN _0 CREATININE 1.65* 1.50* 1.46*  CALCIUM 8.9  --  8.8*  MG  --   --  1.8  PHOS  --   --  3.4   Liver Function Tests: Recent Labs  Lab 11/19/22 1219  AST 37  ALT 36  ALKPHOS 151*  BILITOT 0.1*  PROT 6.7  ALBUMIN 3.5    CBC: Recent Labs  Lab 11/19/22 1219 11/19/22 1220 11/20/22 0329  WBC 5.5  --  6.1  HGB 10.8* 11.6* 11.0*  HCT 33.8* 34.0* 36.1  MCV 85.4  --  88.3  PLT 247  --  223    CBG: Recent Labs  Lab 11/20/22 2001 11/21/22 0041 11/21/22 0421 11/21/22 0735 11/21/22 1151  GLUCAP 144* 111* 102* 85 139*     IMAGING STUDIES MR BRAIN W WO CONTRAST  Result Date: 11/21/2022 CLINICAL DATA:  Seizure disorder, clinical change.  Recent fall. EXAM: MRI HEAD WITHOUT AND WITH CONTRAST TECHNIQUE: Multiplanar, multiecho pulse sequences of the brain and surrounding structures were obtained without and with intravenous contrast. CONTRAST:  93m GADAVIST GADOBUTROL 1 MMOL/ML IV SOLN COMPARISON:  Head CT  11/19/2022 and MRI 09/05/2021 FINDINGS: Brain: There is no evidence of an acute infarct, intracranial hemorrhage, mass, midline shift, or extra-axial fluid collection. Scattered small T2 hyperintensities in the cerebral white matter bilaterally are unchanged from the prior MRI and are nonspecific but compatible with mild chronic small vessel ischemic disease. There is mild generalized cerebral atrophy. No abnormal enhancement is identified. The hippocampi are symmetric in size and normal in signal. Vascular: Major intracranial vascular flow voids are preserved. Skull and upper cervical spine: Unremarkable bone marrow signal. Sinuses/Orbits: Bilateral cataract extraction. Paranasal sinuses and mastoid air cells are clear. Other: None. IMPRESSION: 1. No acute intracranial abnormality or etiology of seizures identified. 2. Mild chronic small vessel ischemic disease. Electronically Signed   By: ALogan BoresM.D.   On: 11/21/2022 15:41   Overnight EEG with video  Result Date: 11/20/2022 SSamuella Cota MD     11/20/2022  8:46 AM EEG Procedure CPT/Type of Study: 950277 24hr EEG with video Referring Provider: KDante GangPrimary Neurological Diagnosis: AMS History: This is a 71yr old patient, undergoing an EEG to evaluate for AMS, seizures. Clinical State: disoriented Technical Description: The EEG was performed using standard setting per the guidelines of American Clinical Neurophysiology Society (ACNS). A minimum of 21 electrodes were placed on scalp according to the International 10-20 or/and 10-10 Systems. Supplemental electrodes were placed as needed. Single EKG electrode was also used to detect cardiac arrhythmia. Patient's behavior was continuously recorded on video simultaneously with EEG. A minimum of 16 channels were used for data display. Each epoch of study was reviewed manually daily and as needed using standard referential and bipolar montages. Computerized quantitative EEG analysis (such as compressed spectral  array analysis, trending, automated spike & seizure detection) were used as indicated. Day 1: from 1409 11/19/22 to 0730 12/5/236 EEG Description: Overall Amplitude:Normal Predominant Frequency: The background activity showed posterior dominant alpha, with about 9 Hz, that was occasional. Superimposed Frequencies: frequent theta and some beta activity bilaterally The background was symmetric Background Abnormalities: None Rhythmic or periodic pattern: No Epileptiform activity: no Electrographic seizures: no Events: no Breach rhythm: no Reactivity: Present Stimulation procedures: Hyperventilation: not done Photic stimulation: no change Sleep Background: Stage II EKG:no significant arrhythmia Impression: This was an abnormal continuous video EEG due to moderate background slowing, indicative of a non-specific encephalopathy pattern. No seizures or epileptiform discharges were seen.   EEG adult  Result Date: 11/19/2022 CSpero Curb MD     11/19/2022  4:54 PM [facility] Routine EEG Report Video Performed: Performed Demographics: Patient Name:   BLoukisha, GunnersonDate of Birth:   023-Nov-1952Identification Number:   MRN - [id number] Times: Study Start Time:   11/19/2022 13:16:00 Study End Time:   11/19/2022 13:53:00 Indication(s): Spells, Eval for Seizures Medication: Keppra, gabapentin Technical Summary: This EEG was performed utilizing standard International 10-20 System of electrode placement. One channel electrocardiogram was monitored. Data were obtained, stored, and interpreted  utilizing referential montage recording, with reformatting to longitudinal, transverse bipolar, and referential montages as necessary for interpretation. State(s):       Awake      Drowsy      Asleep Activation Procedures: Hyperventilation: Not performed Photic Stimulation: Not performed EEG Description: Background showed a reduction of 10 Hz posterior dominant alpha rhythm. The  posterior dominant alpha rhythm is non-sustained and intermixed with diffuse fast activity and theta slow 6-7 Hz.  The amplitudes were 20--30 uV.  There was no normal sleep structures observed.  There was no clinical event noted.  Throughout the record, there was no epileptiform abnormality or lateralizing sign observed.  Intermittent myogenic and movement artifacts were noted. Impression: This is an abnormal EEG indicative of a mild to moderate diffuse encephalopathy. This is non specific in etiology and could be from benzodiazepine effect (Ativan).  No epileptiform abnormality, electrographic seizures or evidence of status epilepticus were present. No lateralizing sign is observed. Dr Spero Curb [facility] For Inpatient follow-up with [facility] physician please call [facility] [phone]. This is not an outpatient service. Post hospital discharge, please contact hospital directly.   DG Shoulder Left Port  Result Date: 11/19/2022 CLINICAL DATA:  Fall, left shoulder pain EXAM: LEFT SHOULDER COMPARISON:  None Available. FINDINGS: No fracture. No glenohumeral dislocation. No evidence of acromioclavicular separation. Mild AC joint osteoarthritis. No significant glenohumeral arthropathy. No suspicious focal osseous lesions. Loop recorder overlies the left chest. IMPRESSION: No fracture or malalignment in the left shoulder. Mild AC joint osteoarthritis. Electronically Signed   By: Ilona Sorrel M.D.   On: 11/19/2022 15:22   CT HEAD WO CONTRAST  Result Date: 11/19/2022 CLINICAL DATA:  Trauma, fall EXAM: CT HEAD WITHOUT CONTRAST TECHNIQUE: Contiguous axial images were obtained from the base of the skull through the vertex without intravenous contrast. RADIATION DOSE REDUCTION: This exam was performed according to the departmental dose-optimization program which includes automated exposure control, adjustment of the mA and/or kV according to patient size and/or use of iterative reconstruction  technique. COMPARISON:  08/12/2022 FINDINGS: Brain: No acute intracranial findings are seen in noncontrast CT brain. There are no signs of bleeding within the cranium. Minimal calcifications are seen in basal ganglia. Cortical sulci are prominent. Scattered dural calcifications are seen. Vascular: Unremarkable. Skull: No fracture is seen in calvarium. There is subcutaneous contusion/hematoma in the left frontal scalp. Sinuses/Orbits: There are no air-fluid levels in paranasal sinuses. There is a old small blowout fracture in the floor of left orbit with no interval change. There is no herniation of orbital contents. Other: None. IMPRESSION: No acute intracranial findings are seen in noncontrast CT brain. Atrophy. No fracture is seen in calvarium. Electronically Signed   By: Elmer Picker M.D.   On: 11/19/2022 13:03   CT CERVICAL SPINE WO CONTRAST  Result Date: 11/19/2022 CLINICAL DATA:  Trauma EXAM: CT CERVICAL SPINE WITHOUT CONTRAST TECHNIQUE: Multidetector CT imaging of the cervical spine was performed without intravenous contrast. Multiplanar CT image reconstructions were also generated. RADIATION DOSE REDUCTION: This exam was performed according to the departmental dose-optimization program which includes automated exposure control, adjustment of the mA and/or kV according to patient size and/or use of iterative reconstruction technique. COMPARISON:  Cervical spine radiographs done on 08/08/2022 and CT cervical spine done on 09/14/2016 FINDINGS: Alignment: There is minimal 1-2 mm retrolisthesis at C5-C6 level. This finding has not changed. Skull base and vertebrae: No recent fracture is seen. Degenerative changes are noted with disc space narrowing and bony spurs at multiple levels.  Soft tissues and spinal canal: There is no central spinal stenosis. Posterior bony spurs are causing extrinsic pressure over the ventral margin of thecal sac at multiple levels. Disc levels: There is encroachment of neural  foramina by bony spurs from C3-C7 levels. Upper chest: Unremarkable. Other: There is inhomogeneous attenuation in thyroid. There is 1.8 cm low-density nodule in the posterior right lobe. These findings have not changed significantly. IMPRESSION: No recent fracture is seen in cervical spine. Cervical spondylosis with encroachment of neural foramina from C3-C7 levels. There is inhomogeneous attenuation in thyroid with 1.8 cm nodule in the right lobe. Similar finding was seen in previous CT done on 09/14/2016. Electronically Signed   By: Elmer Picker M.D.   On: 11/19/2022 12:58   DG Chest Portable 1 View  Result Date: 11/19/2022 CLINICAL DATA:  History of fall in a 71 year old female. Level 2 trauma. EXAM: PORTABLE CHEST 1 VIEW COMPARISON:  August 21, 2022 FINDINGS: EKG leads project over the chest. Cardiac loop recorder projects over the LEFT chest. Trachea midline. Cardiomediastinal contours and hilar structures are normal. Lungs are clear. No pneumothorax or sign of pleural effusion on AP portable radiograph. On limited assessment no acute skeletal process. IMPRESSION: 1. No acute cardiopulmonary disease. 2. Cardiac loop recorder in place. Electronically Signed   By: Zetta Bills M.D.   On: 11/19/2022 12:25    DISCHARGE EXAMINATION: See progress note from earlier today   DISPOSITION: Home  Discharge Instructions     Call MD for:  difficulty breathing, headache or visual disturbances   Complete by: As directed    Call MD for:  extreme fatigue   Complete by: As directed    Call MD for:  persistant dizziness or light-headedness   Complete by: As directed    Call MD for:  persistant nausea and vomiting   Complete by: As directed    Call MD for:  severe uncontrolled pain   Complete by: As directed    Call MD for:  temperature >100.4   Complete by: As directed    Diet - low sodium heart healthy   Complete by: As directed    Discharge instructions   Complete by: As directed     Please take your medications as prescribed.  Please be sure to follow-up with your primary care provider within 1 week.  Also follow-up with your neurologist, Dr. [name], within the next few weeks.  You were cared for by a hospitalist during your hospital stay. If you have any questions about your discharge medications or the care you received while you were in the hospital after you are discharged, you can call the unit and asked to speak with the hospitalist on call if the hospitalist that took care of you is not available. Once you are discharged, your primary care physician will handle any further medical issues. Please note that NO REFILLS for any discharge medications will be authorized once you are discharged, as it is imperative that you return to your primary care physician (or establish a relationship with a primary care physician if you do not have one) for your aftercare needs so that they can reassess your need for medications and monitor your lab values. If you do not have a primary care physician, you can call [phone] for a physician referral.   Increase activity slowly   Complete by: As directed           Allergies as of 11/21/2022       Reactions  Lotrel [amlodipine Besy-benazepril Hcl] Anaphylaxis   Contrast Media [iodinated Contrast Media] Swelling, Other (See Comments)   Causes neck swelling    Niacin Hives   Sulfa Antibiotics Hives        Medication List     STOP taking these medications    copper tablet   diazoxide 50 MG/ML suspension Commonly known as: PROGLYCEM   zinc gluconate 50 MG tablet       TAKE these medications    Accu-Chek Guide test strip Generic drug: glucose blood USE TO CHECK BLOOD SUGAR UP TO 4 TIMES A DAY AS DIRECTED   Accu-Chek Softclix Lancets lancets SMARTSIG:Topical 1-4 Times Daily   atorvastatin 40 MG tablet Commonly known as: LIPITOR TAKE ONE TABLET BY MOUTH EVERY MORNING   blood glucose meter kit and supplies Dispense  Accu-Chek Guide. Use up to four times daily as directed. 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   calcitRIOL 0.25 MCG capsule Commonly known as: ROCALTROL Take 0.25 mcg by mouth daily.   gabapentin 300 MG capsule Commonly known as: NEURONTIN Take 1 capsule (300 mg total) by mouth 3 (three) times daily.   Gvoke HypoPen 2-Pack 1 MG/0.2ML Soaj Generic drug: Glucagon Inject 0.2 mLs into the skin as needed.   hydrOXYzine 100 MG capsule Commonly known as: VISTARIL Take 1 capsule (100 mg total) by mouth 3 (three) times daily.   levETIRAcetam 500 MG tablet Commonly known as: KEPPRA Take 1 tablet (500 mg total) by mouth 2 (two) times daily.   linaclotide 290 MCG Caps capsule Commonly known as: Linzess TAKE 1 CAPSULE BY MOUTH EVERY DAY BEFORE BREAKFAST What changed:  how much to take how to take this when to take this   metFORMIN 500 MG tablet Commonly known as: GLUCOPHAGE Take by mouth 2 (two) times daily with a meal.   metoprolol succinate 25 MG 24 hr tablet Commonly known as: TOPROL-XL TAKE 1 TABLET (25 MG TOTAL) BY MOUTH DAILY.   mirtazapine 30 MG tablet Commonly known as: REMERON TAKE ONE TABLET BY MOUTH EVERYDAY AT BEDTIME   omeprazole 20 MG capsule Commonly known as: PRILOSEC Take 1 capsule (20 mg total) by mouth daily.   ondansetron 4 MG tablet Commonly known as: ZOFRAN TAKE 1 TABLET BY MOUTH EVERY 8 HOURS AS NEEDED What changed: reasons to take this   polyethylene glycol powder 17 GM/SCOOP powder Commonly known as: MiraLax Take 1 scoop or 17 g 3 times a day for 2 days then use 1 scoop twice a day a day until you begin having soft bowel movements then cut back to 1 scoop a day.   rivastigmine 3 MG capsule Commonly known as: EXELON Take 3 mg by mouth 2 (two) times daily.   traMADol 50 MG tablet Commonly known as: ULTRAM Take 1 tablet (50 mg total) by mouth every 6 (six) hours as needed for moderate pain or severe pain.   triamcinolone cream 0.5 % Commonly known  as: KENALOG Apply 1 Application topically 3 (three) times daily as needed (flares).          Follow-up Information     Jerrol Banana., MD. Schedule an appointment as soon as possible for a visit in 1 week(s).   Specialty: Family Medicine Why: post hospitalization follow up Contact information: [address] [place] [address] [phone]         Vladimir Crofts, MD Follow up in 2 week(s).   Specialty: Neurology Why: post hospitalization follow up Contact information: [address]  [facility] [place] [address] [phone]                 TOTAL DISCHARGE TIME: 35 minutes  [place]  [facility] Pager on [email or web address]  11/22/2022, 11:49 AM

## 2022-11-21 NOTE — Progress Notes (Signed)
Initial Nutrition Assessment  DOCUMENTATION CODES:  Non-severe (moderate) malnutrition in context of chronic illness  INTERVENTION:  Continue regular diet as tolerated Provide meal preferences to optimize po intake Provide snacks between meal trays to increase protein and calorie consumption Continue B12 and add daily MVI (per home regimen) Provide Ensure Max (chocolate) q day to provide 150kcal and 30g protein/bottle  NUTRITION DIAGNOSIS:  Moderate Malnutrition related to chronic illness (lewy body dementia and hx roux-en-y) as evidenced by mild fat depletion, moderate muscle depletion.  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  Visited pt at bedside with husband present. Pt provided most of her nutrition related history and husband corrected information. Pt with decreased po intake for some time now. She has PMH of roux-en-y surgery and still reports early satiety. NFPE shows fat loss and muscle wasting. Pt meets ASPEN criteria for moderate protein calorie malnutrition r/t chronic illness.  Discuss NFPE findings with pt and encouraged adequate nutrition. Diet recall lacking in protein containing foods. Recommend ONS. Pt was initially resistant but after discussing types of supplements and reason she was agreeable to try Ensure Max chocolate q day. Discussed with gastric bypass surgery it is important to try small, frequent meals, snacks and supplements to meet nutrient needs. Encouraged pt to continue taking vitamin and mineral supplements. Pt reports enjoying all fruits and vegetables.  Medications reviewed and include: lipitor, keppra, remeron, protonix, vitamin B-12, D51/2NS  at 26m/hr  Labs reviewed: BG<145, Cr:1.46, GFR:39  NUTRITION - FOCUSED PHYSICAL EXAM:  Flowsheet Row Most Recent Value  Orbital Region Mild depletion  Upper Arm Region Severe depletion  Thoracic and Lumbar Region Moderate depletion  Buccal Region Mild depletion  Temple Region Moderate depletion  Clavicle Bone Region Severe depletion  Clavicle and Acromion Bone Region Moderate depletion  Scapular Bone Region Unable to assess  Dorsal Hand No depletion  Patellar Region Moderate depletion  Anterior Thigh Region Mild depletion  Posterior Calf Region Mild depletion  Hair Reviewed  Eyes Reviewed  Mouth Reviewed  Skin Reviewed  Nails Reviewed       Diet Order:   Diet Order             Diet regular Room service appropriate? Yes; Fluid consistency: Thin  Diet effective now                   EDUCATION NEEDS:  Education needs have been addressed  Skin:  Skin Assessment: Reviewed RN Assessment  Last BM:  12/5  Height:  Ht Readings from Last 1 Encounters:  11/19/22 '5\' 3"'$  (1.6 m)   Weight:  Wt Readings from Last 1 Encounters:  11/19/22 57.2 kg    BMI:  Body mass index is 22.34 kg/m.  Estimated Nutritional Needs:  Kcal:  1430-1720kcal Protein:  70-85g Fluid:  14372m KaCandise BowensMS, RD, LDN, CNSC See AMiON for contact information

## 2022-11-21 NOTE — Progress Notes (Signed)
LTM EEG discontinued - no skin breakdown at unhook.   

## 2022-11-21 NOTE — Plan of Care (Signed)
  Problem: Education: Goal: Knowledge of General Education information will improve Description: Including pain rating scale, medication(s)/side effects and non-pharmacologic comfort measures Outcome: Adequate for Discharge   Problem: Health Behavior/Discharge Planning: Goal: Ability to manage health-related needs will improve Outcome: Adequate for Discharge   Problem: Clinical Measurements: Goal: Ability to maintain clinical measurements within normal limits will improve Outcome: Adequate for Discharge Goal: Will remain free from infection Outcome: Adequate for Discharge Goal: Diagnostic test results will improve Outcome: Adequate for Discharge Goal: Respiratory complications will improve Outcome: Adequate for Discharge Goal: Cardiovascular complication will be avoided Outcome: Adequate for Discharge   Problem: Activity: Goal: Risk for activity intolerance will decrease Outcome: Adequate for Discharge   Problem: Nutrition: Goal: Adequate nutrition will be maintained Outcome: Adequate for Discharge   Problem: Coping: Goal: Level of anxiety will decrease Outcome: Adequate for Discharge   Problem: Elimination: Goal: Will not experience complications related to bowel motility Outcome: Adequate for Discharge Goal: Will not experience complications related to urinary retention Outcome: Adequate for Discharge   Problem: Pain Managment: Goal: General experience of comfort will improve Outcome: Adequate for Discharge   Problem: Safety: Goal: Ability to remain free from injury will improve Outcome: Adequate for Discharge   Problem: Skin Integrity: Goal: Risk for impaired skin integrity will decrease Outcome: Adequate for Discharge   Problem: Education: Goal: Expressions of having a comfortable level of knowledge regarding the disease process will increase Outcome: Adequate for Discharge   Problem: Coping: Goal: Ability to adjust to condition or change in health will  improve Outcome: Adequate for Discharge Goal: Ability to identify appropriate support needs will improve Outcome: Adequate for Discharge   Problem: Health Behavior/Discharge Planning: Goal: Compliance with prescribed medication regimen will improve Outcome: Adequate for Discharge   Problem: Medication: Goal: Risk for medication side effects will decrease Outcome: Adequate for Discharge   Problem: Clinical Measurements: Goal: Complications related to the disease process, condition or treatment will be avoided or minimized Outcome: Adequate for Discharge Goal: Diagnostic test results will improve Outcome: Adequate for Discharge   Problem: Safety: Goal: Verbalization of understanding the information provided will improve Outcome: Adequate for Discharge   Problem: Self-Concept: Goal: Level of anxiety will decrease Outcome: Adequate for Discharge Goal: Ability to verbalize feelings about condition will improve Outcome: Adequate for Discharge   Problem: Education: Goal: Ability to describe self-care measures that may prevent or decrease complications (Diabetes Survival Skills Education) will improve Outcome: Adequate for Discharge Goal: Individualized Educational Video(s) Outcome: Adequate for Discharge   Problem: Coping: Goal: Ability to adjust to condition or change in health will improve Outcome: Adequate for Discharge   Problem: Fluid Volume: Goal: Ability to maintain a balanced intake and output will improve Outcome: Adequate for Discharge   Problem: Health Behavior/Discharge Planning: Goal: Ability to identify and utilize available resources and services will improve Outcome: Adequate for Discharge Goal: Ability to manage health-related needs will improve Outcome: Adequate for Discharge   Problem: Metabolic: Goal: Ability to maintain appropriate glucose levels will improve Outcome: Adequate for Discharge   Problem: Nutritional: Goal: Maintenance of adequate  nutrition will improve Outcome: Adequate for Discharge Goal: Progress toward achieving an optimal weight will improve Outcome: Adequate for Discharge   Problem: Skin Integrity: Goal: Risk for impaired skin integrity will decrease Outcome: Adequate for Discharge   Problem: Tissue Perfusion: Goal: Adequacy of tissue perfusion will improve Outcome: Adequate for Discharge

## 2022-11-21 NOTE — Procedures (Signed)
EEG Procedure CPT/Type of Study: 91791; 24hr EEG with video Referring Provider: Dante Gang Primary Neurological Diagnosis: AMS   History: This is a 71 yr old patient, undergoing an EEG to evaluate for AMS, seizures. Clinical State: disoriented   Technical Description:   The EEG was performed using standard setting per the guidelines of American Clinical Neurophysiology Society (ACNS).   A minimum of 21 electrodes were placed on scalp according to the International 10-20 or/and 10-10 Systems. Supplemental electrodes were placed as needed. Single EKG electrode was also used to detect cardiac arrhythmia. Patient's behavior was continuously recorded on video simultaneously with EEG. A minimum of 16 channels were used for data display. Each epoch of study was reviewed manually daily and as needed using standard referential and bipolar montages. Computerized quantitative EEG analysis (such as compressed spectral array analysis, trending, automated spike & seizure detection) were used as indicated.    Day 2: from 0730 11/20/22 to 0730 12/6/236   EEG Description: Overall Amplitude:Normal Predominant Frequency: The background activity showed posterior dominant alpha, with about 9 Hz, that was occasional. Superimposed Frequencies: frequent theta and some beta activity bilaterally The background was symmetric   Background Abnormalities: None Rhythmic or periodic pattern: No Epileptiform activity: no Electrographic seizures: no Events: no    Breach rhythm: no   Reactivity: Present   Stimulation procedures:  Hyperventilation: not done Photic stimulation: no change   Sleep Background: Stage II   EKG:no significant arrhythmia   Impression: This was an abnormal continuous video EEG due to moderate background slowing, indicative of a non-specific encephalopathy pattern. No seizures or epileptiform discharges were see

## 2022-11-21 NOTE — Progress Notes (Signed)
[facility] PROGRESS NOTE   JAQUEL COOMER JFH:[id number] DOB: November 03, 1951 DOA: 11/19/2022  PCP: Jerrol Banana., MD  Brief History/Interval Summary: 71 y.o. female with medical history significant of mild [name] but functional at home, she drives and shops, has history of hypertension and hyperlipidemia presented to the emergency room with fall at home.  She was cleaning her bathroom, when she stood up she hit towel bar and then fell on the tile.  She developed hematoma left forehead, was complaining of left shoulder pain.  After injury she was able to get up and walk to the couch.  Appeared more confused than usual so they called EMS and she was brought to emergency room.  Apparently, for a skeletal survey, patient was sent to CT scan and she was noted to have smacking her lips continuously that was evaluated by neurology at the bedside and recommended to treat for focal seizure.  Skeletal survey negative including CT scan of the head and CT scan of the cervical spine as well as left shoulder.    Consultants: Neurology  Procedures: Continuous EEG    Subjective/Interval History: Patient complains of headache at the top of her head.  Denies any visual disturbances.  Overall she feels better.  No chest pain or shortness of breath.  No nausea or vomiting.    Assessment/Plan:  Seizure disorder, new onset Noted to have witnessed seizure by neurology.  Started on Keppra.  Currently on continuous EEG. She needs to undergo MRI brain with and without contrast after she is taken off of EEG.  Will also need to be seen by PT and OT.  Fall with soft tissue injury Symptomatic management.  Skeletal survey was negative.  Diabetes mellitus type 2, diet controlled Monitor CBGs.  Essential hypertension Monitor blood pressures closely.  H/o [name] Seems to be mild.  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  No family at bedside Disposition Plan: Hopefully return home once cleared by neurology  Status is: Inpatient Remains inpatient appropriate because: Seizures, continuous EEG      Medications: Scheduled:  atorvastatin  40 mg Oral q morning   gabapentin  300 mg Oral TID   heparin  5,000 Units Subcutaneous Q8H   levETIRAcetam  500 mg Oral BID   mirtazapine  30 mg Oral QHS   pantoprazole  40 mg Oral Daily   cyanocobalamin  500 mcg Oral Daily   Continuous:  dextrose 5 % and 0.45% NaCl 75 mL/hr at 11/21/22 1055   LHT:[id number] **OR** acetaminophen, dextrose, LORazepam, ondansetron (ZOFRAN) IV  Antibiotics: Anti-infectives (From admission, onward)    None       Objective:  Vital Signs  Vitals:   11/20/22 1955 11/20/22 2324 11/21/22 0257 11/21/22 0737  BP: (!) 115/55 121/63 124/70 139/87  Pulse: (!) 58 (!) 58 61 68  Resp:  16  16  Temp: 98.3 F (36.8 C) 98 F (36.7 C) 98.2 F (36.8 C) 98 F (36.7 C)  TempSrc: Oral Oral Oral   SpO2: 100% 99% 100% 100%  Weight:      Height:        Intake/Output Summary (Last 24 hours) at 11/21/2022 1108 Last data filed at 11/20/2022 1633 Gross per 24 hour  Intake 614.86 ml  Output 550 ml  Net 64.86 ml   Filed Weights   11/19/22 1216  Weight: 57.2 kg  General appearance: Awake alert.  In no distress Resp: Clear to auscultation bilaterally.  Normal effort Cardio: S1-S2 is normal regular.  No S3-S4.  No rubs murmurs or bruit GI: Abdomen is soft.  Nontender nondistended.  Bowel sounds are present normal.  No masses organomegaly Extremities: No edema.  Full range of motion of lower extremities. Neurologic: Alert and oriented x3.  No focal neurological deficits.    Lab Results:  Data Reviewed: I have personally reviewed following labs and reports of the imaging studies  CBC: Recent Labs  Lab 11/19/22 1219 11/19/22 1220  11/20/22 0329  WBC 5.5  --  6.1  HGB 10.8* 11.6* 11.0*  HCT 33.8* 34.0* 36.1  MCV 85.4  --  88.3  PLT 247  --  580    Basic Metabolic Panel: Recent Labs  Lab 11/19/22 1219 11/19/22 1220 11/20/22 0329  NA 141 144 141  K 3.6 3.7 3.8  CL 108 107 109  CO2 27  --  23  GLUCOSE 85 83 94  BUN '10 11 11  '$ CREATININE 1.65* 1.50* 1.46*  CALCIUM 8.9  --  8.8*  MG  --   --  1.8  PHOS  --   --  3.4    GFR: Estimated Creatinine Clearance: 30.1 mL/min (A) (by C-G formula based on SCr of 1.46 mg/dL (H)).  Liver Function Tests: Recent Labs  Lab 11/19/22 1219  AST 37  ALT 36  ALKPHOS 151*  BILITOT 0.1*  PROT 6.7  ALBUMIN 3.5     Coagulation Profile: Recent Labs  Lab 11/19/22 1219  INR 1.1   CBG: Recent Labs  Lab 11/20/22 1625 11/20/22 2001 11/21/22 0041 11/21/22 0421 11/21/22 0735  GLUCAP 125* 144* 111* 102* 85      Radiology Studies: Overnight EEG with video  Result Date: 11/20/2022 Samuella Cota, MD     11/20/2022  8:46 AM EEG Procedure CPT/Type of Study: 99833; 24hr EEG with video Referring Provider: Dante Gang Primary Neurological Diagnosis: AMS History: This is a 71 yr old patient, undergoing an EEG to evaluate for AMS, seizures. Clinical State: disoriented Technical Description: The EEG was performed using standard setting per the guidelines of American Clinical Neurophysiology Society (ACNS). A minimum of 21 electrodes were placed on scalp according to the International 10-20 or/and 10-10 Systems. Supplemental electrodes were placed as needed. Single EKG electrode was also used to detect cardiac arrhythmia. Patient's behavior was continuously recorded on video simultaneously with EEG. A minimum of 16 channels were used for data display. Each epoch of study was reviewed manually daily and as needed using standard referential and bipolar montages. Computerized quantitative EEG analysis (such as compressed spectral array analysis, trending, automated spike & seizure  detection) were used as indicated. Day 1: from 1409 11/19/22 to 0730 12/5/236 EEG Description: Overall Amplitude:Normal Predominant Frequency: The background activity showed posterior dominant alpha, with about 9 Hz, that was occasional. Superimposed Frequencies: frequent theta and some beta activity bilaterally The background was symmetric Background Abnormalities: None Rhythmic or periodic pattern: No Epileptiform activity: no Electrographic seizures: no Events: no Breach rhythm: no Reactivity: Present Stimulation procedures: Hyperventilation: not done Photic stimulation: no change Sleep Background: Stage II EKG:no significant arrhythmia Impression: This was an abnormal continuous video EEG due to moderate background slowing, indicative of a non-specific encephalopathy pattern. No seizures or epileptiform discharges were seen.   EEG adult  Result Date: 11/19/2022 Spero Curb, MD     11/19/2022  4:54 PM [facility] Routine EEG Report  Video Performed: Performed Demographics: Patient Name:   Bennie, Scaff Date of Birth:   Apr 12, 1951 Identification Number:   MRN - [id number] Study Times: Study Start Time:   11/19/2022 13:16:00 Study End Time:   11/19/2022 13:53:00 Indication(s): Spells, Eval for Seizures Medication: Keppra, gabapentin Technical Summary: This EEG was performed utilizing standard International 10-20 System of electrode placement. One channel electrocardiogram was monitored. Data were obtained, stored, and interpreted utilizing referential montage recording, with reformatting to longitudinal, transverse bipolar, and referential montages as necessary for interpretation. State(s):       Awake      Drowsy      Asleep Activation Procedures: Hyperventilation: Not performed Photic Stimulation: Not performed EEG Description: Background showed a reduction of 10 Hz posterior dominant alpha rhythm. The posterior dominant alpha rhythm is non-sustained and  intermixed with diffuse fast activity and theta slow 6-7 Hz.  The amplitudes were 20--30 uV.  There was no normal sleep structures observed.  There was no clinical event noted.  Throughout the record, there was no epileptiform abnormality or lateralizing sign observed.  Intermittent myogenic and movement artifacts were noted. Impression: This is an abnormal EEG indicative of a mild to moderate diffuse encephalopathy. This is non specific in etiology and could be from benzodiazepine effect (Ativan).  No epileptiform abnormality, electrographic seizures or evidence of status epilepticus were present. No lateralizing sign is observed. Dr Spero Curb [facility] For Inpatient follow-up with [facility] physician please call [facility] [phone]. This is not an outpatient service. Post hospital discharge, please contact hospital directly.   DG Shoulder Left Port  Result Date: 11/19/2022 CLINICAL DATA:  Fall, left shoulder pain EXAM: LEFT SHOULDER COMPARISON:  None Available. FINDINGS: No fracture. No glenohumeral dislocation. No evidence of acromioclavicular separation. Mild AC joint osteoarthritis. No significant glenohumeral arthropathy. No suspicious focal osseous lesions. Loop recorder overlies the left chest. IMPRESSION: No fracture or malalignment in the left shoulder. Mild AC joint osteoarthritis. Electronically Signed   By: Ilona Sorrel M.D.   On: 11/19/2022 15:22   CT HEAD WO CONTRAST  Result Date: 11/19/2022 CLINICAL DATA:  Trauma, fall EXAM: CT HEAD WITHOUT CONTRAST TECHNIQUE: Contiguous axial images were obtained from the base of the skull through the vertex without intravenous contrast. RADIATION DOSE REDUCTION: This exam was performed according to the departmental dose-optimization program which includes automated exposure control, adjustment of the mA and/or kV according to patient size and/or use of iterative reconstruction technique. COMPARISON:  08/12/2022 FINDINGS: Brain: No acute  intracranial findings are seen in noncontrast CT brain. There are no signs of bleeding within the cranium. Minimal calcifications are seen in basal ganglia. Cortical sulci are prominent. Scattered dural calcifications are seen. Vascular: Unremarkable. Skull: No fracture is seen in calvarium. There is subcutaneous contusion/hematoma in the left frontal scalp. Sinuses/Orbits: There are no air-fluid levels in paranasal sinuses. There is a old small blowout fracture in the floor of left orbit with no interval change. There is no herniation of orbital contents. Other: None. IMPRESSION: No acute intracranial findings are seen in noncontrast CT brain. Atrophy. No fracture is seen in calvarium. Electronically Signed   By: Elmer Picker M.D.   On: 11/19/2022 13:03   CT CERVICAL SPINE WO CONTRAST  Result Date: 11/19/2022 CLINICAL DATA:  Trauma EXAM: CT CERVICAL SPINE WITHOUT CONTRAST TECHNIQUE: Multidetector CT imaging of the cervical spine was performed without intravenous contrast. Multiplanar CT image reconstructions were also generated. RADIATION DOSE REDUCTION: This exam was performed according to the departmental dose-optimization program which includes automated  exposure control, adjustment of the mA and/or kV according to patient size and/or use of iterative reconstruction technique. COMPARISON:  Cervical spine radiographs done on 08/08/2022 and CT cervical spine done on 09/14/2016 FINDINGS: Alignment: There is minimal 1-2 mm retrolisthesis at C5-C6 level. This finding has not changed. Skull base and vertebrae: No recent fracture is seen. Degenerative changes are noted with disc space narrowing and bony spurs at multiple levels. Soft tissues and spinal canal: There is no central spinal stenosis. Posterior bony spurs are causing extrinsic pressure over the ventral margin of thecal sac at multiple levels. Disc levels: There is encroachment of neural foramina by bony spurs from C3-C7 levels. Upper chest:  Unremarkable. Other: There is inhomogeneous attenuation in thyroid. There is 1.8 cm low-density nodule in the posterior right lobe. These findings have not changed significantly. IMPRESSION: No recent fracture is seen in cervical spine. Cervical spondylosis with encroachment of neural foramina from C3-C7 levels. There is inhomogeneous attenuation in thyroid with 1.8 cm nodule in the right lobe. Similar finding was seen in previous CT done on 09/14/2016. Electronically Signed   By: Elmer Picker M.D.   On: 11/19/2022 12:58   DG Chest Portable 1 View  Result Date: 11/19/2022 CLINICAL DATA:  History of fall in a 71 year old female. Level 2 trauma. EXAM: PORTABLE CHEST 1 VIEW COMPARISON:  August 21, 2022 FINDINGS: EKG leads project over the chest. Cardiac loop recorder projects over the LEFT chest. Trachea midline. Cardiomediastinal contours and hilar structures are normal. Lungs are clear. No pneumothorax or sign of pleural effusion on AP portable radiograph. On limited assessment no acute skeletal process. IMPRESSION: 1. No acute cardiopulmonary disease. 2. Cardiac loop recorder in place. Electronically Signed   By: Zetta Bills M.D.   On: 11/19/2022 12:25       LOS: 2 days   [facility] Pager on [email or web address]  11/21/2022, 11:08 AM

## 2022-11-21 NOTE — Plan of Care (Signed)
Neurology plan of care  MRI brain wwo no seizure focus identified. OK to discharge patient on keppra '500mg'$  bid and home dose gabapentin. Counseled no driving x6 mos after last seizure. She may f/u with established outpatient neurologist Dr. Jennings Books.  Su Monks, MD [facility] [phone]  If 7pm- 7am, please page neurology on call as listed in [place].

## 2022-11-21 NOTE — Evaluation (Signed)
Occupational Therapy Evaluation Patient Details Name: Julie Acevedo MRN: [id number] DOB: 08-16-1951 Today's Date: 11/21/2022   History of Present Illness 71 y.o. female adm 12/4 with medical history significant of mild [name] but functional at home, she drives and shops, has history of hypertension and hyperlipidemia.   Presented to the emergency room after a fall at home.  CT SCAN:No acute intracranial findings.  MRI Pending.  L shoul x-ray:No fracture or malalignment in the left shoulder. Mild AC joint  osteoarthritis.   Clinical Impression   Patient admitted for the diagnosis above.  MRI is pending, but other than L shoulder pain, her only deficit is mild dizziness with her initial stand.  Patient's balance improved the more she walked in the room, and her dizziness subsided.  OT was unable to check for orthostatics.  OT will follow in the acute setting.  Currently she is needing slight Min Guard to supervision for mobility and ADL completion.  She lives with her spouse, who is able to provide any needed assist.  No OT is anticipated at home.       Recommendations for follow up therapy are one component of a multi-disciplinary discharge planning process, led by the attending physician.  Recommendations may be updated based on patient status, additional functional criteria and insurance authorization.   Follow Up Recommendations  No OT follow up     Assistance Recommended at Discharge Intermittent Supervision/Assistance  Patient can return home with the following Assist for transportation;Help with stairs or ramp for entrance;Assistance with cooking/housework    Functional Status Assessment  Patient has had a recent decline in their functional status and demonstrates the ability to make significant improvements in function in a reasonable and predictable amount of time.  Equipment Recommendations  None recommended by OT    Recommendations for Other Services        Precautions / Restrictions Precautions Precautions: Fall Restrictions Weight Bearing Restrictions: No      Mobility Bed Mobility Overal bed mobility: Modified Independent                  Transfers Overall transfer level: Needs assistance Equipment used: 1 person hand held assist Transfers: Sit to/from Stand, Bed to chair/wheelchair/BSC Sit to Stand: Supervision     Step pivot transfers: Min guard            Balance Overall balance assessment: Needs assistance Sitting-balance support: Feet supported Sitting balance-Leahy Scale: Good     Standing balance support: Single extremity supported Standing balance-Leahy Scale: Fair Standing balance comment: balance improved the more she moved                           ADL either performed or assessed with clinical judgement   ADL                                         General ADL Comments: Generalized Min Guard to supervision due to initial dizziness when standing.     Vision Patient Visual Report: No change from baseline                  Pertinent Vitals/Pain Pain Assessment Pain Assessment: Faces Faces Pain Scale: Hurts little more Pain Location: L shoulder Pain Descriptors / Indicators: Tender Pain Intervention(s): Monitored during session     Hand Dominance Left  Extremity/Trunk Assessment Upper Extremity Assessment Upper Extremity Assessment: LUE deficits/detail LUE Deficits / Details: pin point pain over L biceps tendon origination. LUE: Shoulder pain with ROM LUE Sensation: WNL LUE Coordination: WNL   Lower Extremity Assessment Lower Extremity Assessment: Overall WFL for tasks assessed   Cervical / Trunk Assessment Cervical / Trunk Assessment: Normal   Communication Communication Communication: No difficulties   Cognition Arousal/Alertness: Awake/alert Behavior During Therapy: WFL for tasks assessed/performed Overall Cognitive Status: Within  Functional Limits for tasks assessed                                       General Comments   OT provided ice for her shoulder    Exercises     Shoulder Instructions      Home Living Family/patient expects to be discharged to:: Private residence Living Arrangements: Spouse/significant other Available Help at Discharge: Family;Available 24 hours/day Type of Home: House Home Access: Stairs to enter [employer] of Steps: 3-5 Entrance Stairs-Rails: Left Home Layout: Two level;Able to live on main level with bedroom/bathroom Alternate Level Stairs-Number of Steps: Pt does not go upstairs anymore   Bathroom Shower/Tub: Walk-in shower;Tub/shower unit   Bathroom Toilet: Handicapped height Bathroom Accessibility: Yes How Accessible: Accessible via walker Home Equipment: [place] (2 wheels);Cane - single point;BSC/3in1;Grab bars - toilet;Grab bars - tub/shower;Wheelchair - manual          Prior Functioning/Environment Prior Level of Function : Independent/Modified Independent;Driving                        OT Problem List: Impaired balance (sitting and/or standing);Pain      OT Treatment/Interventions: Self-care/ADL training;Balance training;Therapeutic activities    OT Goals(Current goals can be found in the care plan section) Acute Rehab OT Goals Patient Stated Goal: Hoping to go home today OT Goal Formulation: With patient Time For Goal Achievement: 12/05/22 Potential to Achieve Goals: Good ADL Goals Pt Will Perform Grooming: Independently;sitting;standing Pt Will Perform Lower Body Dressing: Independently;sit to/from stand Pt Will Transfer to Toilet: Independently;ambulating;regular height toilet  OT Frequency: Min 2X/week    Co-evaluation              AM-PAC OT "6 Clicks" Daily Activity     Outcome Measure Help from another person eating meals?: None Help from another person taking care of personal grooming?: A  Little Help from another person toileting, which includes using toliet, bedpan, or urinal?: A Little Help from another person bathing (including washing, rinsing, drying)?: A Little Help from another person to put on and taking off regular upper body clothing?: None Help from another person to put on and taking off regular lower body clothing?: A Little 6 Click Score: 20   End of Session Nurse Communication: Mobility status  Activity Tolerance: Patient tolerated treatment well Patient left: in bed;with call bell/phone within reach;with family/visitor present  OT Visit Diagnosis: Unsteadiness on feet (R26.81);Pain Pain - Right/Left: Left Pain - part of body: Shoulder                Time: 6010-9323 OT Time Calculation (min): 19 min Charges:  OT General Charges $OT Visit: 1 Visit OT Evaluation $OT Eval Moderate Complexity: 1 Mod  11/21/2022  RP, OTR/L  Acute Rehabilitation Services  Office:  [phone]   Julie Acevedo 11/21/2022, 4:12 PM

## 2022-11-21 NOTE — Progress Notes (Signed)
LTM maint complete - no skin breakdown Fp1 Fz  Several leads fixed [facility] monitored, Event button test confirmed by [facility].

## 2022-11-21 NOTE — TOC Initial Note (Signed)
Transition of Care [facility]) - Initial/Assessment Note    Patient Details  Name: Julie Acevedo MRN: [id number] Date of Birth: 1951-12-02  Transition of [facility]) CM/SW Contact:    Pollie Friar, RN Phone Number: 11/21/2022, 3:10 PM  Clinical Narrative:                 CM went to see the patient and she was off the floor. Pts spouse states they are together most of the time.  Patient does most of the driving.  She manages her own medications and denies any issues. TOC following for d/c needs.   Expected Discharge Plan: Home/Self Care Barriers to Discharge: Continued Medical Work up   Patient Goals and CMS Choice        Expected Discharge Plan and Services Expected Discharge Plan: Home/Self Care   Discharge Planning Services: CM Consult   Living arrangements for the past 2 months: Single Family Home                                      Prior Living Arrangements/Services Living arrangements for the past 2 months: Single Family Home Lives with:: Spouse Patient language and need for interpreter reviewed:: Yes Do you feel safe going back to the place where you live?: Yes        Care giver support system in place?: Yes (comment) Current home services: DME (cane/ walker/ shower seat/ wheelchair) Criminal Activity/Legal Involvement Pertinent to Current Situation/Hospitalization: No - Comment as needed  Activities of Daily Living Home Assistive Devices/Equipment: Eyeglasses, Grab bars in shower, Grab bars around toilet, Shower chair with back, Environmental consultant (specify type), Wheelchair, CBG Meter (2 walkers, 1 with 4 wheels, 1 with no wheels per pt.) ADL Screening (condition at time of admission) Patient's cognitive ability adequate to safely complete daily activities?: Yes Is the patient deaf or have difficulty hearing?: No Does the patient have difficulty seeing, even when wearing glasses/contacts?: Yes Does the patient have difficulty concentrating, remembering, or making  decisions?: Yes Patient able to express need for assistance with ADLs?: Yes Does the patient have difficulty dressing or bathing?: No Independently performs ADLs?: Yes (appropriate for developmental age) Does the patient have difficulty walking or climbing stairs?: Yes Weakness of Legs: Both Weakness of Arms/Hands: None  Permission Sought/Granted                  Emotional Assessment Appearance:: Appears stated age Attitude/Demeanor/Rapport: Engaged Affect (typically observed): Accepting     Psych Involvement: No (comment)  Admission diagnosis:  Seizure ([place]) [R56.9] Fall, initial encounter [W19.XXXA] Patient Active Problem List   Diagnosis Date Noted   Seizure ([place]) 11/19/2022   Malnutrition of moderate degree 08/11/2022   Hypoglycemia 08/08/2022   Diarrhea 08/08/2022   Lower abdominal pain 08/08/2022   UTI (urinary tract infection) 08/08/2022   Elevated lipase 08/08/2022   Transaminitis 08/08/2022   Hypoalbuminemia 08/08/2022   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, psychotic disturbance, mood disturbance, or anxiety ([place]) 11/22/2021   TIA (transient ischemic attack) 09/05/2021   Stomatitis and mucositis 07/21/2021   Cheilitis 07/21/2021   Proteinuria, unspecified 05/04/2021   Stage 3b chronic kidney disease ([place]) 05/04/2021   Anemia in chronic kidney disease 05/04/2021   Hypokalemia 05/04/2021   Other and unspecified hyperlipidemia 03/17/2021   Callus of foot 01/19/2021   Pes planus 01/19/2021   PTTD (posterior tibial tendon dysfunction) 01/19/2021   Acute  flank pain 12/15/2020   Cystitis 12/15/2020   Urinary symptom or sign 12/15/2020   Low back pain radiating to right lower extremity 06/23/2020   Gastroesophageal reflux disease 04/28/2020   Large bowel obstruction ([place]) 04/17/2019   Acquired trigger finger 03/20/2019   Iron deficiency anemia 11/21/2018   Syncope 03/18/2018   Anemia 03/18/2018   Exophthalmos 02/22/2018   Multiple  thyroid nodules 02/22/2018   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   Pain at surgical site 08/30/2016   Ventral incisional hernia 08/21/2016   SBO (small bowel obstruction) (HCC)    Partial small bowel obstruction ([place]) 04/23/2016   Adjustment disorder with mixed anxiety and depressed mood 04/23/2016   Memory difficulties 09/01/2015   Arthritis 06/11/2015   Back pain, chronic 06/11/2015   HLD (hyperlipidemia) 06/11/2015   Cannot sleep 06/11/2015   L-S radiculopathy 06/11/2015   Arthritis of knee, degenerative 06/11/2015   B12 deficiency 06/11/2015   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 [place] [address] Phone: [phone] Fax: [phone]  [id number] [address] [address] [place]Phone: [phone]: [phone] [facility] [address]: [phone]: [phone]    Social Determinants of Health (SDOH) Interventions    Readmission Risk Interventions     No data to display

## 2022-11-21 NOTE — Consult Note (Signed)
   [facility] Inpatient Consult   11/21/2022  ASAL TEAS 07/17/1951 [id number]  [place] Organization [ACO] Patient: [employer] Medicare  Primary Care Provider:  Jerrol Banana., MD  Referral:  [facility] RN CM alerted this writer of admission on 11/20/22.  A patient search for [facility] eligibility via rosters, however, patient was not on banner or BI report.  Patient screened for hospitalization with noted high risk score for unplanned readmission risk  Review of patient's electronic medical record reveals patient is admitted with seizures. Met with patient and spouse at the bedside and introduced reason for rounding visit.  Patient is pleasant but states she has a"whopper of a headache still, I'm waiting for an MRI." Patient endorses PCP and for ongoing post hospital follow up with [facility] RNCM.    Plan:  Continue to follow progress and disposition to assess for post hospital community care coordination/management needs.  Referral request for community care coordination: active with [facility] RN CC prior to admission  Of note, [place] does not replace or interfere with any arrangements made by the Inpatient Transition of Care team.  For questions contact:   Natividad Brood, RN BSN [place]  [phone] business mobile phone Toll free office [phone]  *[place]  [phone] Fax number: [phone] [email or web address].

## 2022-11-22 ENCOUNTER — Other Ambulatory Visit: Payer: Self-pay | Admitting: Physician Assistant

## 2022-11-22 DIAGNOSIS — Z1231 Encounter for screening mammogram for malignant neoplasm of breast: Secondary | ICD-10-CM

## 2022-11-23 ENCOUNTER — Telehealth: Payer: Self-pay | Admitting: *Deleted

## 2022-11-23 NOTE — Progress Notes (Signed)
  Care Coordination  Note  11/23/2022 Name: Julie Acevedo MRN: [id number] DOB: Aug 28, 1951  Julie Acevedo is a 71 y.o. year old primary care patient. I reached out to [name] by phone today to assist with scheduling a follow up appointment. [name] verbally consented to my assistance.       Follow up plan: Hospital Follow Up appointment scheduled with ([name]) on (12/03/2022) at (340pm).  Called [facility] to schedule pt hospital f/u for 11/30/2022, spoke with pt who had another appt already scheduled and asked for 12/18 appt - scheduled for 12/18 @ 340pm   Julian Hy, [place]: [phone]

## 2022-11-23 NOTE — Patient Outreach (Signed)
  Care Coordination [facility] Note Transition Care Management Follow-up Telephone Call Date of discharge and from where: [id number] [facility] How have you been since you were released from the hospital? I have a little headache. I am taking tramadol and it helps Any questions or concerns? No  Items Reviewed: Did the pt receive and understand the discharge instructions provided? Yes  Medications obtained and verified? Yes  Other? Yes  Per patient she does not take Metformin or Miralax any more Any new allergies since your discharge? No  Dietary orders reviewed? No Do you have support at home? Yes   Home Care and Equipment/Supplies: Were home health services ordered? not applicable If so, what is the name of the agency? N   Has the agency set up a time to come to the patient's home? not applicable Were any new equipment or medical supplies ordered?  No What is the name of the medical supply agency? N/a Were you able to get the supplies/equipment? not applicable Do you have any questions related to the use of the equipment or supplies? No  Functional Questionnaire: (I = Independent and D = Dependent) ADLs: I  Bathing/Dressing- I  Meal Prep- I  Eating- I  Maintaining continence- I  Transferring/Ambulation- I  Managing Meds- I  Follow up appointments reviewed:  PCP Hospital f/u appt confirmed? No  . [facility] f/u appt confirmed? Yes  Dr [name] [phone] 1:30. Are transportation arrangements needed? No  If their condition worsens, is the pt aware to call PCP or go to the Emergency Dept.? Yes Was the patient provided with contact information for the PCP's office or ED? Yes Was to pt encouraged to call back with questions or concerns? Yes  SDOH assessments and interventions completed:   Yes SDOH Interventions Today    Flowsheet Row Most Recent Value  SDOH Interventions   Food Insecurity Interventions Intervention Not Indicated  Housing Interventions Intervention Not Indicated   Utilities Interventions Intervention Not Indicated       Care Coordination Interventions:  PCP follow up appointment requested Patient is being followed by Valente David    Encounter Outcome:  Pt. Visit Completed    [facility] [place] [facility] [phone]

## 2022-11-27 ENCOUNTER — Ambulatory Visit (INDEPENDENT_AMBULATORY_CARE_PROVIDER_SITE_OTHER): Payer: Medicare Other

## 2022-11-27 DIAGNOSIS — I10 Essential (primary) hypertension: Secondary | ICD-10-CM

## 2022-11-27 DIAGNOSIS — R569 Unspecified convulsions: Secondary | ICD-10-CM

## 2022-11-27 NOTE — Progress Notes (Unsigned)
Chronic Care Management Pharmacy Note  11/28/2022 Name:  Julie Acevedo MRN:  [id number] DOB:  1951-04-08  Summary: Patient presents for CCM follow-up.   -Patient has not picked up [place] yet. She states she is still driving, we discussed the importance of not driving due to seizure risk. She does report having family who can provide transportation for her.  Recommendations/Changes made from today's visit: -START Keppra today, patient will have [facility].    Plan: CPP follow-up 1 month  Subjective: Julie Acevedo is an 71 y.o. year old female who is a primary patient of Mardene Speak, [place].  The CCM team was consulted for assistance with disease management and care coordination needs.    Engaged with patient by telephone for follow up visit in response to provider referral for pharmacy case management and/or care coordination services.   Consent to Services:  The patient was given information about Chronic Care Management services, agreed to services, and gave verbal consent prior to initiation of services.  Please see initial visit note for detailed documentation.   Patient Care Team: Mardene Speak, PA-C as PCP - General (Physician Assistant) Yolonda Kida, MD as PCP - Cardiology (Cardiology) Lorelee Cover., MD as Consulting Physician (Ophthalmology) Bjorn Loser, MD as Consulting Physician (Urology) Bary Castilla, Forest Gleason, MD as Consulting Physician (General Surgery) Pabon, Marjory Lies, MD as Consulting Physician (General Surgery) Gardiner Barefoot, DPM as Consulting Physician (Podiatry) Gae Dry, MD as Referring Physician (Obstetrics and Gynecology) Vladimir Crofts, MD as Consulting Physician (Neurology) Randon Goldsmith, OT as Occupational Therapist (Occupational Therapy) Lloyd Huger, MD as Consulting Physician (Oncology) Vanita Ingles, RN as Case Manager (General Practice)  Recent office visits: 11/15/22: Patient presented to Mardene Speak, PA-C for follow-up.   Recent consult visits: 05/24/22: Patient presented to Dr. Lanora Manis (Nephrology) for follow-up.  05/04/22: Patient presented to Dr. [name] (Neurology) for follow-up. Stop trazodone, start mirtazapine 15 mg nightly.   Hospital visits: 11/19/22: Patient hospitalized for fall.   Objective:  Lab Results  Component Value Date   CREATININE 1.46 (H) 11/20/2022   BUN 11 11/20/2022   GFRNONAA 39 (L) 11/20/2022   GFRAA 42 (L) 12/05/2020   NA 141 11/20/2022   K 3.8 11/20/2022   CALCIUM 8.8 (L) 11/20/2022   CO2 23 11/20/2022    Lab Results  Component Value Date/Time   HGBA1C 5.4 08/09/2022 06:40 PM   HGBA1C 5.1 02/13/2022 02:36 PM   HGBA1C 5.6 09/06/2021 04:43 AM   HGBA1C 6.3 01/18/2014 06:32 AM   MICROALBUR 20 06/23/2020 02:10 PM   MICROALBUR 50 11/18/2017 09:33 AM    Last diabetic Eye exam:  Lab Results  Component Value Date/Time   HMDIABEYEEXA No Retinopathy 12/03/2019 12:00 AM    Last diabetic Foot exam: No results found for: "HMDIABFOOTEX"   Lab Results  Component Value Date   CHOL 134 09/06/2021   HDL 60 09/06/2021   LDLCALC 63 09/06/2021   TRIG 54 09/06/2021   CHOLHDL 2.2 09/06/2021       Latest Ref Rng & Units 11/19/2022   12:19 PM 08/31/2022    6:45 AM 08/30/2022    8:42 PM  Hepatic Function  Total Protein 6.5 - 8.1 g/dL 6.7  6.2  7.2   Albumin 3.5 - 5.0 g/dL 3.5  3.0  3.7   AST 15 - 41 U/L 37  48  57   ALT 0 - 44 U/L 36  47  58  Alk Phosphatase 38 - 126 U/L 151  149  161   Total Bilirubin 0.3 - 1.2 mg/dL 0.1  0.4  0.5     Lab Results  Component Value Date/Time   TSH 3.012 08/08/2022 12:43 PM   TSH 1.896 06/28/2021 01:23 PM   TSH 3.730 12/05/2020 08:55 AM   TSH 2.170 08/18/2020 11:36 AM   FREET4 0.91 06/28/2021 01:23 PM       Latest Ref Rng & Units 11/20/2022    3:29 AM 11/19/2022   12:20 PM 11/19/2022   12:19 PM  CBC  WBC 4.0 - 10.5 K/uL 6.1   5.5   Hemoglobin 12.0 - 15.0 g/dL 11.0  11.6  10.8   Hematocrit 36.0 - 46.0 %  36.1  34.0  33.8   Platelets 150 - 400 K/uL 223   247     Lab Results  Component Value Date/Time   VD25OH 51.23 08/09/2022 06:40 PM   VD25OH 8.7 (L) 06/16/2019 03:51 PM    Clinical ASCVD: Yes  The ASCVD Risk score (Arnett DK, et al., 2019) failed to calculate for the following reasons:   The patient has a prior MI or stroke diagnosis       09/20/2022    3:37 PM 04/24/2022    1:17 PM 03/28/2022    8:30 AM  Depression screen PHQ 2/9  Decreased Interest 0 0   Down, Depressed, Hopeless 0 0 0  PHQ - 2 Score 0 0 0    Social History   Tobacco Use  Smoking Status Never  Smokeless Tobacco Never   BP Readings from Last 3 Encounters:  11/21/22 124/61  11/02/22 138/74  08/31/22 133/67   Pulse Readings from Last 3 Encounters:  11/21/22 66  11/02/22 61  08/31/22 68   Wt Readings from Last 3 Encounters:  11/19/22 126 lb 1.7 oz (57.2 kg)  11/02/22 126 lb (57.2 kg)  08/31/22 123 lb (55.8 kg)    Assessment/Interventions: Review of patient past medical history, allergies, medications, health status, including review of consultants reports, laboratory and other test data, was performed as part of comprehensive evaluation and provision of chronic care management services.   SDOH:  (Social Determinants of Health) assessments and interventions performed: Yes SDOH Interventions    Flowsheet Row Telephone from 11/23/2022 in [place] Most recent reading at 11/23/2022 12:37 PM Care Coordination from 09/25/2022 in [place] Most recent reading at 09/25/2022 10:50 AM Care Coordination from 09/20/2022 in [place] Most recent reading at 09/20/2022  3:33 PM Clinical Support from 03/28/2022 in [facility] Most recent reading at 03/28/2022  8:31 AM Chronic Care Management from 12/26/2021 in [facility] Most recent reading at 01/01/2022 10:11 AM  Patient Outreach Telephone from 12/26/2021 in [facility] Most recent reading at 12/26/2021 11:13 AM  SDOH Interventions        Food Insecurity Interventions Intervention Not Indicated Intervention Not Indicated Intervention Not Indicated Intervention Not Indicated -- --  Housing Interventions Intervention Not Indicated -- Intervention Not Indicated Intervention Not Indicated -- --  Transportation Interventions -- -- Intervention Not Indicated Intervention Not Indicated -- --  Utilities Interventions Intervention Not Indicated Intervention Not Indicated Intervention Not Indicated -- -- --  Depression Interventions/Treatment  -- -- -- -- -- Currently on Treatment  Financial Strain Interventions -- -- -- Intervention Not Indicated Intervention Not Indicated --  Physical Activity Interventions -- -- -- Intervention Not Indicated -- --  Stress Interventions -- -- -- Intervention Not Indicated -- --  Social Connections Interventions -- -- -- Intervention Not Indicated -- --          CCM Care Plan  Allergies  Allergen Reactions   Lotrel [Amlodipine Besy-Benazepril Hcl] Anaphylaxis   Contrast Media [Iodinated Contrast Media] Swelling and Other (See Comments)    Causes neck swelling    Niacin Hives   Sulfa Antibiotics Hives    Medications Reviewed Today     Reviewed by Verlin Grills, RN (Case Manager) on 11/23/22 at 1241  Med List Status: <None>   Medication Order Taking? Sig Documenting Provider Last Dose Status Informant  ACCU-CHEK GUIDE test strip [id number] No USE TO CHECK BLOOD SUGAR UP TO 4 TIMES A DAY AS DIRECTED Bacigalupo, Dionne Bucy, MD Taking Active Spouse/Significant Other, Pharmacy Records  Accu-Chek Softclix Lancets lancets [id number]  SMARTSIG:Topical 1-4 Times Daily [provider]  Active Spouse/Significant Other, Pharmacy Records  atorvastatin (LIPITOR) 40 MG tablet [id number] No TAKE ONE TABLET BY MOUTH EVERY MORNING Jerrol Banana., MD  11/18/2022 Active Spouse/Significant Other, Pharmacy Records           Med Note Geanie Logan [facility] Ronald Lobo Nov 19, 2022  5:07 PM) Pt spouse brought in this medication.  blood glucose meter kit and supplies [id number] No Dispense Accu-Chek Guide. Use up to four times daily as directed. 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 Jerrol Banana., MD Taking Active Spouse/Significant Other, Pharmacy Records  calcitRIOL (ROCALTROL) 0.25 MCG capsule [id number] No Take 0.25 mcg by mouth daily. [provider] 11/18/2022 Active Spouse/Significant Other, Pharmacy Records           Med Note Geanie Logan, [facility] Ronald Lobo Nov 19, 2022  5:07 PM) Pt spouse brought in this medication.  gabapentin (NEURONTIN) 300 MG capsule [id number] No Take 1 capsule (300 mg total) by mouth 3 (three) times daily. Jerrol Banana., MD 11/18/2022 Active Spouse/Significant Other, Pharmacy Records           Med Note Geanie Logan, [facility] Ronald Lobo Nov 19, 2022  5:07 PM) Pt spouse brought in this medication.  Glucagon (GVOKE HYPOPEN 2-PACK) 1 MG/0.2ML SOAJ [id number] No Inject 0.2 mLs into the skin as needed. Jerrol Banana., MD unk Active Spouse/Significant Other, Pharmacy Records           Med Note Geanie Logan, [facility] Ronald Lobo Nov 19, 2022  5:04 PM) Recent Dispenses     08/21/2022 1 MG/0.2ML SOAJ (disp .4, 2d supply)    hydrOXYzine (VISTARIL) 100 MG capsule [id number] No Take 1 capsule (100 mg total) by mouth 3 (three) times daily. Jerrol Banana., MD 11/18/2022 Active Spouse/Significant Other, Pharmacy Records           Med Note Geanie Logan, [facility] Ronald Lobo Nov 19, 2022  5:08 PM) Pt spouse brought in this medication.  levETIRAcetam (KEPPRA) 500 MG tablet [id number]  Take 1 tablet (500 mg total) by mouth 2 (two) times daily. Bonnielee Haff, MD  Active   linaclotide Rolan Lipa) 290 MCG CAPS capsule [id number] No TAKE 1 CAPSULE BY MOUTH EVERY DAY BEFORE BREAKFAST  Patient taking differently: Take 290 mcg by mouth daily before breakfast. TAKE 1 CAPSULE  BY MOUTH EVERY DAY BEFORE BREAKFAST   Jerrol Banana., MD 11/18/2022 Active Spouse/Significant Other, Pharmacy Records           Med Note Geanie Logan, [facility] Ronald Lobo Nov 19, 2022  5:07 PM) Pt spouse brought in this medication.  metFORMIN (GLUCOPHAGE) 500 MG tablet [id number] No Take by mouth 2 (two) times daily with a meal. [provider] 11/18/2022 Active Spouse/Significant Other, Pharmacy Records           Med Note Geanie Logan [facility] Ronald Lobo Nov 19, 2022  6:28 PM) Pt spouse brought in this medication.  metoprolol succinate (TOPROL-XL) 25 MG 24 hr tablet [id number] No TAKE 1 TABLET (25 MG TOTAL) BY MOUTH DAILY. Jerrol Banana., MD 11/18/2022 unknown exact time Active Spouse/Significant Other, Pharmacy Records           Med Note Geanie Logan, [facility] Ronald Lobo Nov 19, 2022  6:24 PM) Pt and Pt's spouse did not know if Pt had medication today.  Pt's spouse claim Pt had medications last night.  Pt's spouse brought in this medication.  Recent Dispenses     09/05/2022 25 MG TB24 (disp 90, 90d supply)  mirtazapine (REMERON) 30 MG tablet [id number] No TAKE ONE TABLET BY MOUTH EVERYDAY AT BEDTIME  Patient not taking: Reported on 11/19/2022   Eulis Foster, MD Not Taking Active Spouse/Significant Other, Pharmacy Records           Med Note Geanie Logan [facility] Ronald Lobo Nov 19, 2022  5:05 PM) Recent Dispenses     11/13/2022 30 MG TABS (disp 30, 30d supply)    omeprazole (PRILOSEC) 20 MG capsule [id number] No Take 1 capsule (20 mg total) by mouth daily. Jerrol Banana., MD 11/18/2022 Active Spouse/Significant Other, Pharmacy Records           Med Note Geanie Logan, [facility] Ronald Lobo Nov 19, 2022  5:06 PM) Pt spouse brought in this medication.  ondansetron (ZOFRAN) 4 MG tablet [id number] No TAKE 1 TABLET BY MOUTH EVERY 8 HOURS AS NEEDED  Patient taking differently: Take 4 mg by mouth every 8 (eight) hours as needed for nausea or vomiting.   Jerrol Banana., MD unk Active Spouse/Significant Other,  Pharmacy Records  polyethylene glycol powder [facility]) 17 GM/SCOOP powder [id number] No Take 1 scoop or 17 g 3 times a day for 2 days then use 1 scoop twice a day a day until you begin having soft bowel movements then cut back to 1 scoop a day. Nena Polio, MD unk Active Spouse/Significant Other, Pharmacy Records  rivastigmine (EXELON) 3 MG capsule [id number] No Take 3 mg by mouth 2 (two) times daily. [provider] 11/18/2022 Active Spouse/Significant Other, Pharmacy Records           Med Note Geanie Logan, [facility] Ronald Lobo Nov 19, 2022  5:06 PM) Pt spouse brought in this medication.  traMADol (ULTRAM) 50 MG tablet [id number] No Take 1 tablet (50 mg total) by mouth every 6 (six) hours as needed for moderate pain or severe pain. Jerrol Banana., MD unk Active Spouse/Significant Other, Pharmacy Records           Med Note Geanie Logan, [facility] Ronald Lobo Nov 19, 2022  5:05 PM)  Recent Dispenses      10/24/2022 50 MG TABS (disp 100, 25d supply)     triamcinolone cream (KENALOG) 0.5 % [id number] No Apply 1 Application topically 3 (three) times daily as needed (flares). 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, psychotic disturbance, mood disturbance, or anxiety ([place]) 11/22/2021   TIA (transient ischemic attack) 09/05/2021   Stomatitis and mucositis 07/21/2021   Cheilitis 07/21/2021   Proteinuria, unspecified 05/04/2021   Stage 3b chronic kidney disease ([place]) 05/04/2021   Anemia in chronic kidney disease 05/04/2021   Hypokalemia 05/04/2021   Other and  unspecified hyperlipidemia 03/17/2021   Callus of foot 01/19/2021   Pes planus 01/19/2021   PTTD (posterior tibial tendon dysfunction) 01/19/2021   Acute flank pain 12/15/2020   Cystitis 12/15/2020   Urinary symptom or sign 12/15/2020   Low back pain radiating to right lower extremity 06/23/2020   Gastroesophageal reflux disease 04/28/2020   Large bowel obstruction ([place]) 04/17/2019   Acquired trigger finger 03/20/2019   Iron deficiency anemia 11/21/2018   Syncope 03/18/2018   Anemia 03/18/2018   Exophthalmos 02/22/2018   Multiple thyroid nodules 02/22/2018   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   Pain at surgical site 08/30/2016   Ventral incisional hernia 08/21/2016   SBO (small bowel obstruction) (HCC)    Partial small bowel obstruction ([place]) 04/23/2016   Adjustment disorder with mixed anxiety and depressed mood 04/23/2016   Memory difficulties 09/01/2015   Arthritis 06/11/2015   Back pain, chronic 06/11/2015   HLD (hyperlipidemia) 06/11/2015   Cannot sleep 06/11/2015   L-S radiculopathy 06/11/2015   Arthritis of knee, degenerative 06/11/2015   B12 deficiency 06/11/2015   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   Type 2 diabetes mellitus ([place]) 11/15/2012   Avitaminosis D 11/07/2009   Narrowing of intervertebral disc space 07/25/2009   Benign essential HTN 07/25/2009    Immunization History  Administered Date(s) Administered   Fluad Quad(high Dose 65+) 08/15/2019, 09/01/2020, 09/13/2021, 11/15/2022   Influenza Split 10/17/2010, 10/02/2011, 08/27/2012   Influenza, High Dose Seasonal PF 08/29/2018   Influenza,inj,Quad PF,6+ Mos 09/26/2013, 09/06/2014, 09/06/2016   Influenza,inj,Quad PF,6-35 Mos 08/31/2017   Influenza-Unspecified 09/15/2015   PFIZER Comirnaty(Gray Top)Covid-19 Tri-Sucrose Vaccine 04/05/2021, 11/15/2022   PFIZER(Purple Top)SARS-COV-2 Vaccination 01/29/2020, 02/23/2020, 10/11/2020   PNEUMOCOCCAL  CONJUGATE-20 06/24/2021   Pneumococcal Conjugate-13 08/29/2018   Pneumococcal Polysaccharide-23 11/17/2012   Tdap 10/02/2011, 09/27/2016   Zoster Recombinat (Shingrix) 08/29/2018, 08/15/2019   Zoster, Live 09/06/2016    Conditions to be addressed/monitored:  Hypertension, Hyperlipidemia, Diabetes, GERD and Chronic Pain  Care Plan : General Pharmacy (Adult)  Updates made by Germaine Pomfret, RPH since 11/28/2022 12:00 AM     Problem: Hypertension, Hyperlipidemia, Diabetes, GERD and Chronic Pain   Priority: High     Long-Range Goal: Patient-Specific Goal   Start Date: 02/27/2021  Expected End Date: 11/29/2023  This Visit's Progress: On track  Recent Progress: On track  Priority: High  Note:   Current Barriers:  Unable to achieve control of cholesterol  Suboptimal therapeutic regimen for hypertension  Pharmacist Clinical Goal(s):  patient will achieve control of cholesterol as evidenced by LDL less than 70 maintain control of blood pressure as evidenced by BP less than 140/90 and improvement of dizziness symptoms  through collaboration with PharmD and provider.   Interventions: 1:1 collaboration with Jerrol Banana., MD regarding development and update of comprehensive plan of care as evidenced by provider attestation and co-signature  Inter-disciplinary care team collaboration (see longitudinal plan of care) Comprehensive medication review performed; medication list updated in electronic medical record  Hypertension (BP goal <140/90) -Controlled -Current treatment:   Metoprolol XL 25 mg daily  -Medications previously tried: HCTZ (Hypotension)  -Current home readings:  -Reports mild headache.  -Current dietary habits: Denies any recent changes in sodium of caffeine intake. Does report 3 glasses of tea and a few sips of Pepsi daily, which is her baseline.  -Current exercise habits: Working in garden.  -Continue current medications   Hyperlipidemia: (LDL goal <  70) -History of MI 1999 -History of TIA Sep 2022 -Uncontrolled -Current treatment: Atorvastatin 40 mg daily -Medications previously tried: NA  -Continue current medications   Diabetes (A1c goal <7%) -Controlled -Current medications: None -Medications previously tried: NA  -Current home glucose readings fasting glucose:   -Denies hypoglycemic/hyperglycemic symptoms -She has been out of atorvastatin and metformin for sometime. -Continue current medications  Dementia (Goal: Prevent further decline in memory) -Managed by Dr. [name] -Uncontrolled -Current treatment  Rivastigmine 3 mg twice daily  -Medications previously tried: NA -Continue current medications  Seizures (Goal: Prevent seizures) -Uncontrolled -Current treatment  Levetiracetam 500 mg twice daily  -Medications previously tried: NA -Patient has not picked up [place] yet. She states she is still driving, we discussed the importance of not driving due to seizure risk. She does report having family who can provide transportation for her. -START Keppra today, patient will [facility]   -Recommended to continue current medication  Patient Goals/Self-Care Activities patient will:  - check blood pressure 2-3 times weekly, document, and provide at future appointments target a minimum of 150 minutes of moderate intensity exercise weekly  Follow Up Plan: Telephone follow up appointment with care management team member scheduled for:  01/01/2023 at 10:00 AM    Medication Assistance: None required.  Patient affirms current coverage meets needs.  Compliance/Adherence/Medication fill history: Care Gaps: Diabetic Eye Exam Urine Microalbumin COVID-19 Vaccine Booster 5  Star-Rating Drugs: Atorvastatin 40 mg ast filled on 09/08/2021 for a 90-Day supply Metformin 500 mg last filled on 07/16/2021 for a [facility]-Day supply [facility]  Patient's preferred pharmacy is:  [facility] - [place], [place] -  [address] [place] [address] Phone: [phone] Fax: [phone]  [id number] [place]: [phone]: [phone] [facility] [address] [place]Phone: [phone]: [phone] Uses pill box? No Pt endorses 50% compliance  Patient decided to: Utilize [facility] for medication synchronization, packaging and delivery  Care Plan and Follow Up Patient Decision:  Patient agrees to Care Plan and Follow-up.  Plan: Telephone follow up appointment with care management team member scheduled for:  01/01/2023 at 10:00 AM

## 2022-11-28 NOTE — Patient Instructions (Signed)
Visit Information It was great speaking with you today!  Please let me know if you have any questions about our visit.  Patient Care Plan: General Pharmacy (Adult)     Problem Identified: Hypertension, Hyperlipidemia, Diabetes, GERD and Chronic Pain   Priority: High     Long-Range Goal: Patient-Specific Goal   Start Date: 02/27/2021  Expected End Date: 11/29/2023  This Visit's Progress: On track  Recent Progress: On track  Priority: High  Note:   Current Barriers:  Unable to achieve control of cholesterol  Suboptimal therapeutic regimen for hypertension  Pharmacist Clinical Goal(s):  patient will achieve control of cholesterol as evidenced by LDL less than 70 maintain control of blood pressure as evidenced by BP less than 140/90 and improvement of dizziness symptoms  through collaboration with PharmD and provider.   Interventions: 1:1 collaboration with Jerrol Banana., MD regarding development and update of comprehensive plan of care as evidenced by provider attestation and co-signature Inter-disciplinary care team collaboration (see longitudinal plan of care) Comprehensive medication review performed; medication list updated in electronic medical record  Hypertension (BP goal <140/90) -Controlled -Current treatment:   Metoprolol XL 25 mg daily  -Medications previously tried: HCTZ (Hypotension)  -Current home readings:  -Reports mild headache.  -Current dietary habits: Denies any recent changes in sodium of caffeine intake. Does report 3 glasses of tea and a few sips of Pepsi daily, which is her baseline.  -Current exercise habits: Working in garden.  -Continue current medications   Hyperlipidemia: (LDL goal < 70) -History of MI 1999 -History of TIA Sep 2022 -Uncontrolled -Current treatment: Atorvastatin 40 mg daily -Medications previously tried: NA  -Continue current medications   Diabetes (A1c goal <7%) -Controlled -Current medications: None -Medications  previously tried: NA  -Current home glucose readings fasting glucose:   -Denies hypoglycemic/hyperglycemic symptoms -She has been out of atorvastatin and metformin for sometime. -Continue current medications  Dementia (Goal: Prevent further decline in memory) -Managed by Dr. [name] -Uncontrolled -Current treatment  Rivastigmine 3 mg twice daily  -Medications previously tried: NA -Continue current medications  Seizures (Goal: Prevent seizures) -Uncontrolled -Current treatment  Levetiracetam 500 mg twice daily  -Medications previously tried: NA -Patient has not picked up [place] yet. She states she is still driving, we discussed the importance of not driving due to seizure risk. She does report having family who can provide transportation for her. -START Keppra today, patient [facility]   -Recommended to continue current medication  Patient Goals/Self-Care Activities patient will:  - check blood pressure 2-3 times weekly, document, and provide at future appointments target a minimum of 150 minutes of moderate intensity exercise weekly  Follow Up Plan: Telephone follow up appointment with care management team member scheduled for:  01/01/2023 at 10:00 AM    Patient agreed to services and verbal consent obtained.   Patient verbalizes understanding of instructions and care plan provided today and agrees to view in [place]. Active MyChart status and patient understanding of how to access instructions and care plan via MyChart confirmed with patient.     [name], PharmD, [place], CPP  Clinical Pharmacist Practitioner  [facility] [phone]

## 2022-11-30 ENCOUNTER — Inpatient Hospital Stay: Payer: Medicare Other | Admitting: Physician Assistant

## 2022-12-02 ENCOUNTER — Other Ambulatory Visit: Payer: Self-pay | Admitting: Family Medicine

## 2022-12-02 DIAGNOSIS — E1122 Type 2 diabetes mellitus with diabetic chronic kidney disease: Secondary | ICD-10-CM

## 2022-12-03 ENCOUNTER — Telehealth: Payer: Self-pay | Admitting: *Deleted

## 2022-12-03 ENCOUNTER — Inpatient Hospital Stay: Payer: Medicare Other | Admitting: Physician Assistant

## 2022-12-03 NOTE — Progress Notes (Incomplete)
I,Sha'taria Aleric Froelich,acting as a Education administrator for [employer], PA-C.,have documented all relevant documentation on the behalf of Mardene Speak, PA-C,as directed by  [employer], PA-C while in the presence of [employer], PA-C.   Established patient visit   Patient: Julie Acevedo   DOB: 08-15-1951   71 y.o. Female  MRN: [id number] Visit Date: 12/03/2022  Today's healthcare provider: Mardene Speak, PA-C   No chief complaint on file.  Subjective    HPI  Follow up Hospitalization  Patient was admitted to [facility] on 11/20/22 and discharged on 11/21/22. She was treated for fall w/ soft tissue injury and new onset seizure disorder. Treatment for this included consult with neurology and continuous EEG. Upon discharge patient is to    Call MD for:  difficulty breathing, headache or visual disturbances    Complete by: As directed   Call MD for:  extreme fatigue   Complete by: As directed     Call MD for:  persistant dizziness or light-headedness   Complete by: As directed      Call MD for:  persistant nausea and vomiting   Complete by: As directed      Call MD for:  severe uncontrolled pain   Complete by: As directed      Call MD for:  temperature >100.4   Complete by: As directed      Diet - low sodium heart healthy   Complete by: As directed      Discharge instructions   Complete by: As directed      Please take your medications as prescribed.  Please be sure to follow-up with your primary care provider within 1 week.  Also follow-up with your neurologist, Dr. [name], within the next few weeks.   Telephone follow up was done on n/a She reports {excellent/good/fair:19992} compliance with treatment. She reports this condition is {resolved/improved/worsened:23923}.  ----------------------------------------------------------------------------------------- -   Medications: Outpatient Medications Prior to Visit  Medication Sig  . ACCU-CHEK GUIDE test strip USE TO CHECK BLOOD SUGAR UP TO 4  TIMES A DAY AS DIRECTED  . Accu-Chek Softclix Lancets lancets SMARTSIG:Topical 1-4 Times Daily  . atorvastatin (LIPITOR) 40 MG tablet TAKE ONE TABLET BY MOUTH EVERY MORNING  . blood glucose meter kit and supplies Dispense Accu-Chek Guide. Use up to four times daily as directed. 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  . calcitRIOL (ROCALTROL) 0.25 MCG capsule Take 0.25 mcg by mouth daily.  Marland Kitchen gabapentin (NEURONTIN) 300 MG capsule Take 1 capsule (300 mg total) by mouth 3 (three) times daily.  . Glucagon (GVOKE HYPOPEN 2-PACK) 1 MG/0.2ML SOAJ Inject 0.2 mLs into the skin as needed.  . hydrOXYzine (VISTARIL) 100 MG capsule Take 1 capsule (100 mg total) by mouth 3 (three) times daily.  Marland Kitchen levETIRAcetam (KEPPRA) 500 MG tablet Take 1 tablet (500 mg total) by mouth 2 (two) times daily.  Marland Kitchen linaclotide (LINZESS) 290 MCG CAPS capsule TAKE 1 CAPSULE BY MOUTH EVERY DAY BEFORE BREAKFAST (Patient taking differently: Take 290 mcg by mouth daily before breakfast. TAKE 1 CAPSULE BY MOUTH EVERY DAY BEFORE BREAKFAST)  . metFORMIN (GLUCOPHAGE) 500 MG tablet Take by mouth 2 (two) times daily with a meal.  . metoprolol succinate (TOPROL-XL) 25 MG 24 hr tablet TAKE 1 TABLET (25 MG TOTAL) BY MOUTH DAILY.  . mirtazapine (REMERON) 30 MG tablet TAKE ONE TABLET BY MOUTH EVERYDAY AT BEDTIME (Patient not taking: Reported on 11/19/2022)  . omeprazole (PRILOSEC) 20 MG capsule Take 1 capsule (20 mg total) by  mouth daily.  . ondansetron (ZOFRAN) 4 MG tablet TAKE 1 TABLET BY MOUTH EVERY 8 HOURS AS NEEDED (Patient taking differently: Take 4 mg by mouth every 8 (eight) hours as needed for nausea or vomiting.)  . polyethylene glycol powder (MIRALAX) 17 GM/SCOOP powder Take 1 scoop or 17 g 3 times a day for 2 days then use 1 scoop twice a day a day until you begin having soft bowel movements then cut back to 1 scoop a day.  . rivastigmine (EXELON) 3 MG capsule Take 3 mg by mouth 2 (two) times daily.  . traMADol (ULTRAM) 50 MG tablet Take 1 tablet  (50 mg total) by mouth every 6 (six) hours as needed for moderate pain or severe pain.  Marland Kitchen triamcinolone cream (KENALOG) 0.5 % Apply 1 Application topically 3 (three) times daily as needed (flares).   No facility-administered medications prior to visit.    Review of Systems  {Labs  Heme  Chem  Endocrine  Serology  Results Review (optional):23779}   Objective    There were no vitals taken for this visit. {Show previous vital signs (optional):23777}  Physical Exam  ***  No results found for any visits on 12/03/22.  Assessment & Plan    eizure disorder, new onset Noted to have witnessed seizure by neurology.  Started on Keppra.  Was placed on continuous EEG.  No further seizure activity noted.  Taken off of continuous EEG.  Underwent MRI brain which was unremarkable.  Cleared by neurology for discharge on Keppra.   Fall with soft tissue injury Symptomatic management.  Skeletal survey was negative.   Diabetes mellitus type 2, diet controlled     Essential hypertension   H/o [name] Seems to be mild.   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  Cleared by neurology for discharge.  The patient was advised to call back or seek an in-person evaluation if the symptoms worsen or if the condition fails to improve as anticipated.  I discussed the assessment and treatment plan with the patient. The patient was provided an opportunity to ask questions and all were answered. The patient agreed with the plan and demonstrated an understanding of the instructions.  The entirety of the information documented in the History of Present Illness, Review of  Systems and Physical Exam were personally obtained by me. Portions of this information were initially documented by the CMA and reviewed by me for thoroughness and accuracy.  Mardene Speak, [facility], [place] [phone] (phone) [phone] (fax)

## 2022-12-03 NOTE — Patient Instructions (Signed)
Visit Information  Thank you for taking time to visit with me today. Please don't hesitate to contact me if I can be of assistance to you before our next scheduled telephone appointment.  Following are the goals we discussed today:  Attend appointment today with PCP, DO NOT DRIVE SELF.  Monitor blood sugar and blood pressure daily. Consider being active with home health for PT/OT.  Our next appointment is by telephone on 1/2  Please call the care guide team at [phone] if you need to cancel or reschedule your appointment.   Please call the Suicide and Crisis Lifeline: 988 call the [place] National Suicide Prevention Lifeline: 406-287-7802 or TTY: 515-872-9555 TTY (831)079-5817) to talk to a trained counselor call 1-800-273-TALK (toll free, 24 hour hotline) call 911 if you are experiencing a Mental Health or [place] or need someone to talk to.  Patient verbalizes understanding of instructions and care plan provided today and agrees to view in [place]. Active MyChart status and patient understanding of how to access instructions and care plan via MyChart confirmed with patient.     The patient has been provided with contact information for the care management team and has been advised to call with any health related questions or concerns.   Valente David, RN, MSN, [place] Care Management Care Management Coordinator [phone]

## 2022-12-03 NOTE — Patient Outreach (Addendum)
Care Coordination   Follow Up Visit Note   12/03/2022 Name: Julie Acevedo MRN: [id number] DOB: 01/05/1951  Julie Acevedo is a 71 y.o. year old female who sees [place], [place], [place] for primary care. I spoke with  Julie Acevedo by phone today.  What matters to the patients health and wellness today?  Hospitalized 12/4-12/6 for seizure.    Goals Addressed             This Visit's Progress    Post hospital recovery for seizure       Care Coordination Interventions: Evaluation of current treatment plan related to seizure and patient's adherence to plan as established by provider Advised patient to consider MD recommendations for PT/OT Reviewed scheduled/upcoming provider appointments including completed visit with neurology on 12/15 and upcoming follow up visit on 12/29 Noted referral for [facility] for HHPT, OT, and SLP, patient state she does not wish to have services.  Educated on benefits of services, she then states she will have the assessment done but not until after christmas.  State she is busy cooking and preparing for the holidays.       RNCM: Effective Management of Hypoglycemia   On track    Care Coordination Interventions: Provided education to patient about basic DM disease process. The patient has issues with low blood sugars. The patient states she is doing better and eating better. Admits she does not monitor daily and will not monitor daily, only when she feels it is low. Reviewed medications with patient and discussed importance of medication adherence.  Counseled on importance of regular laboratory monitoring as prescribed Discussed plans with patient for ongoing care management follow up and provided patient with direct contact information for care management team Provided patient with written educational materials related to hypo and hyperglycemia and importance of correct treatment. State she will eat food or candy if she has hypoglycemic event Review of heart  healthy/ADA diet. Recommendations for eating smaller more frequent meals and making sure to have snacks on hand when she is going out. Education provided Assisted the patient in resetting her password for myChart. Education and support provided.  Reviewed scheduled/upcoming provider appointments including: This afternoon with PCP.  Discussed transportation as chart states patient should not drive for 6 months post seizure.  Initially stated someone would ride with her, then she stated she would have her daughter take her. Advised patient, providing education and rationale, to check cbg as directed (has a continuous reader) and record, calling pcp for findings outside established parameters The patient agrees to work with the [facility]. The patient provided the [facility] number to call for changes or needs before next outreach. Denies any immediate concerns at this time.         Track and Manage My Blood Pressure-Hypertension   On track    Timeframe:  Long-Range Goal Priority:  High Start Date: 06/25/2022                            Expected End Date: 06/26/2023                      Follow Up within 7 days   - check blood pressure daily    Why is this important?   You won't feel high blood pressure, but it can still hurt your blood vessels.  High blood pressure can cause heart or kidney problems. It can also cause a stroke.  Making lifestyle changes like losing a little weight or eating less salt will help.  Checking your blood pressure at home and at different times of the day can help to control blood pressure.  If the doctor prescribes medicine remember to take it the way the doctor ordered.  Call the office if you cannot afford the medicine or if there are questions about it.     Notes:         SDOH assessments and interventions completed:  No     Care Coordination Interventions:  Yes, provided   Follow up plan: Follow up call scheduled for 1/2    Encounter Outcome:  Pt. Visit Completed    Julie David, RN, MSN, [place] Care Management Care Management Coordinator [phone]

## 2022-12-05 ENCOUNTER — Telehealth: Payer: Self-pay

## 2022-12-05 ENCOUNTER — Other Ambulatory Visit: Payer: Self-pay | Admitting: Dermatology

## 2022-12-05 DIAGNOSIS — L723 Sebaceous cyst: Secondary | ICD-10-CM

## 2022-12-05 NOTE — Progress Notes (Signed)
  Chronic Care Management Pharmacy Assistant   Name: Julie Acevedo  MRN: [id number] DOB: 04/22/1951  Reason for Encounter: Medication Review/Medication Coordination for [facility]   Recent office visits:  None ID  Recent consult visits:  11/30/2022 Hemang Shah Kalpeshkumar, MD (Neurology) for [name]-No medication changes noted, Home Health orders placed, Patient instructed to follow-up in 2 weeks  Hospital visits:  None in previous 6 months  Medications: Outpatient Encounter Medications as of 12/05/2022  Medication Sig Note   ACCU-CHEK GUIDE test strip USE TO CHECK BLOOD SUGAR UP TO 4 TIMES A DAY AS DIRECTED    Accu-Chek Softclix Lancets lancets SMARTSIG:Topical 1-4 Times Daily    atorvastatin (LIPITOR) 40 MG tablet TAKE ONE TABLET BY MOUTH EVERY MORNING 11/19/2022: Pt spouse brought in this medication.   blood glucose meter kit and supplies Dispense Accu-Chek Guide. Use up to four times daily as directed. 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    calcitRIOL (ROCALTROL) 0.25 MCG capsule Take 0.25 mcg by mouth daily. 11/19/2022: Pt spouse brought in this medication.   gabapentin (NEURONTIN) 300 MG capsule Take 1 capsule (300 mg total) by mouth 3 (three) times daily. 11/19/2022: Pt spouse brought in this medication.   Glucagon (GVOKE HYPOPEN 2-PACK) 1 MG/0.2ML SOAJ Inject 0.2 mLs into the skin as needed. 11/19/2022: Recent Dispenses     08/21/2022 1 MG/0.2ML SOAJ (disp .4, 2d supply)     hydrOXYzine (VISTARIL) 100 MG capsule Take 1 capsule (100 mg total) by mouth 3 (three) times daily. 11/19/2022: Pt spouse brought in this medication.   levETIRAcetam (KEPPRA) 500 MG tablet Take 1 tablet (500 mg total) by mouth 2 (two) times daily.    linaclotide (LINZESS) 290 MCG CAPS capsule TAKE 1 CAPSULE BY MOUTH EVERY DAY BEFORE BREAKFAST (Patient taking differently: Take 290 mcg by mouth daily before breakfast. TAKE 1 CAPSULE BY MOUTH EVERY DAY BEFORE BREAKFAST) 11/19/2022: Pt spouse  brought in this medication.   metFORMIN (GLUCOPHAGE) 500 MG tablet Take by mouth 2 (two) times daily with a meal. 11/19/2022: Pt spouse brought in this medication.   metoprolol succinate (TOPROL-XL) 25 MG 24 hr tablet TAKE 1 TABLET (25 MG TOTAL) BY MOUTH DAILY. 11/19/2022: Pt and Pt's spouse did not know if Pt had medication today.  Pt's spouse claim Pt had medications last night.  Pt's spouse brought in this medication.  Recent Dispenses     09/05/2022 25 MG TB24 (disp 90, 90d supply)   mirtazapine (REMERON) 30 MG tablet TAKE ONE TABLET BY MOUTH EVERYDAY AT BEDTIME (Patient not taking: Reported on 11/19/2022) 11/19/2022: Recent Dispenses     11/13/2022 30 MG TABS (disp 30, 30d supply)     omeprazole (PRILOSEC) 20 MG capsule Take 1 capsule (20 mg total) by mouth daily. 11/19/2022: Pt spouse brought in this medication.   ondansetron (ZOFRAN) 4 MG tablet TAKE 1 TABLET BY MOUTH EVERY 8 HOURS AS NEEDED (Patient taking differently: Take 4 mg by mouth every 8 (eight) hours as needed for nausea or vomiting.)    polyethylene glycol powder (MIRALAX) 17 GM/SCOOP powder Take 1 scoop or 17 g 3 times a day for 2 days then use 1 scoop twice a day a day until you begin having soft bowel movements then cut back to 1 scoop a day.    rivastigmine (EXELON) 3 MG capsule Take 3 mg by mouth 2 (two) times daily. 11/19/2022: Pt spouse brought in this medication.   traMADol (ULTRAM) 50 MG tablet Take 1 tablet (50 mg   total) by mouth every 6 (six) hours as needed for moderate pain or severe pain. 11/19/2022:  Recent Dispenses      10/24/2022 50 MG TABS (disp 100, 25d supply)      triamcinolone cream (KENALOG) 0.5 % Apply 1 Application topically 3 (three) times daily as needed (flares).    No facility-administered encounter medications on file as of 12/05/2022.   Care Gaps: Diabetic Eye Exam Diabetic Foot Exam Diabetic Kidney Evaluation   Star Rating Drugs: Atorvastatin 40 mg last filled on 11/13/2022 for a 30-Day  supply with [facility]   BP Readings from Last 3 Encounters:  11/21/22 124/61  11/02/22 138/74  08/31/22 133/67    Lab Results  Component Value Date   HGBA1C 5.4 08/09/2022    Patient obtains medications through Vials  30 Days   Last adherence delivery included:  Atorvastatin 40 mg 1 tablet daily- (Breakfast) Mirtazapine 15 mg 1 tablet daily -(Bedtime) Gabapentin 300 mg 1 capsule three times daily-(Breakfast, Lunch, Bedtime) Hydroxyzine 100 mg 1 capsule three times daily-(Breakfast, Lunch, Bedtime) Linzess 290 mcg caps 1 cap daily (Before Breakfast) Rivastigmine 3 mg twice daily- (Breakfast, Bedtime) Omeprazole 20 mg  1 capsule by mouth daily -(Breakfast)   Patient declined the following medications for the month of December: No medications were declined for this month's delivery  Patient is due for next adherence delivery on: 12/19/2022 [address].  Called patient and reviewed medications and coordinated delivery.  This delivery to include: Atorvastatin 40 mg 1 tablet daily- (Breakfast) Mirtazapine 15 mg 1 tablet daily -(Bedtime) Gabapentin 300 mg 1 capsule three times daily-(Breakfast, Lunch, Bedtime) Hydroxyzine 100 mg 1 capsule three times daily-(Breakfast, Lunch, Bedtime) Linzess 290 mcg caps 1 cap daily (Before Breakfast) Rivastigmine 3 mg twice daily- (Breakfast, Bedtime) Omeprazole 20 mg  1 capsule by mouth daily -(Breakfast) Metoprolol ER 25 mg 1 tablet daily- (Bedtime)   Patient declined the following medications for the month of January: No medications were declined for this month's delivery  Patient needs refills for Atorvastatin 40 mg. This is a PCP medication that can be filled by CPP upon his return on 12/27. If pharmacy can't wait they can send request or I can call PCP's Office.  Confirmed delivery date of 12/19/2022 [address], advised patient that pharmacy will contact them the morning of delivery.  Patient has a follow-up with CPP on 01/01/2023 @  1000.  Lynann Bologna, CPA/CMA Clinical Pharmacist Assistant Phone: [phone]

## 2022-12-06 IMAGING — MG DIGITAL SCREENING BILAT W/ TOMO W/ CAD
6 of 10 series · 6 of 30 positions shown · non-contrast
Comparison: Previous exam(s).

CLINICAL DATA: Screening.

EXAM:
DIGITAL SCREENING BILATERAL MAMMOGRAM WITH TOMO AND CAD

[L CC synth-2D (1 of 2)]
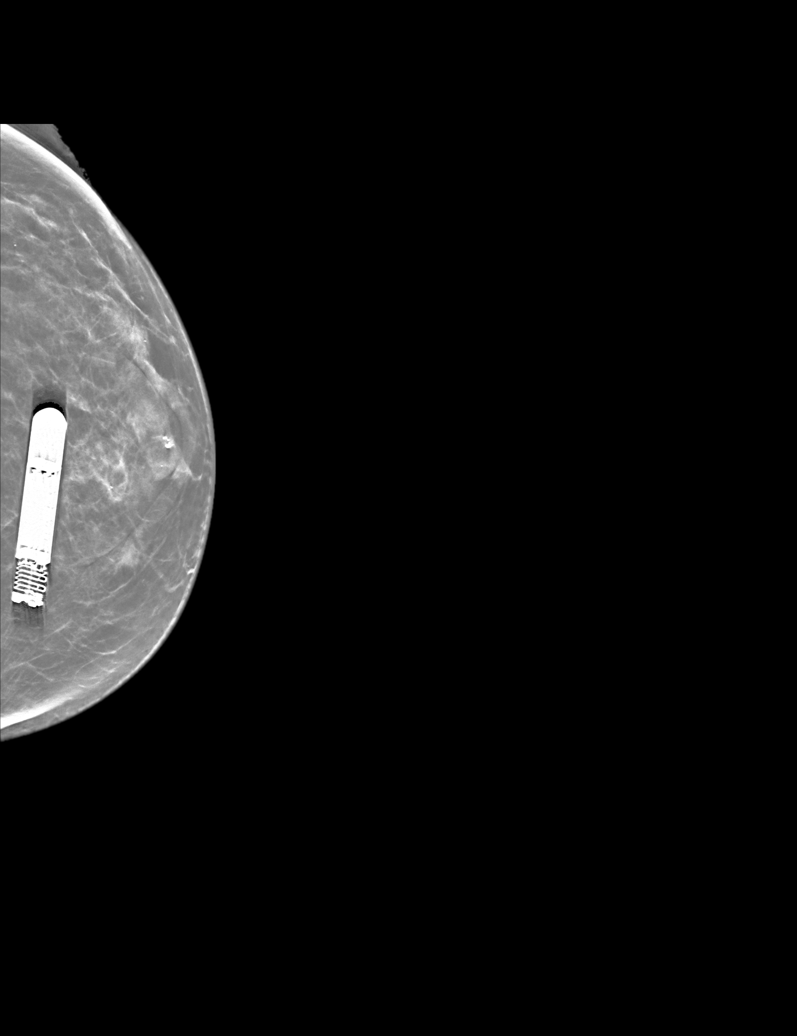

[L MLO synth-2D]
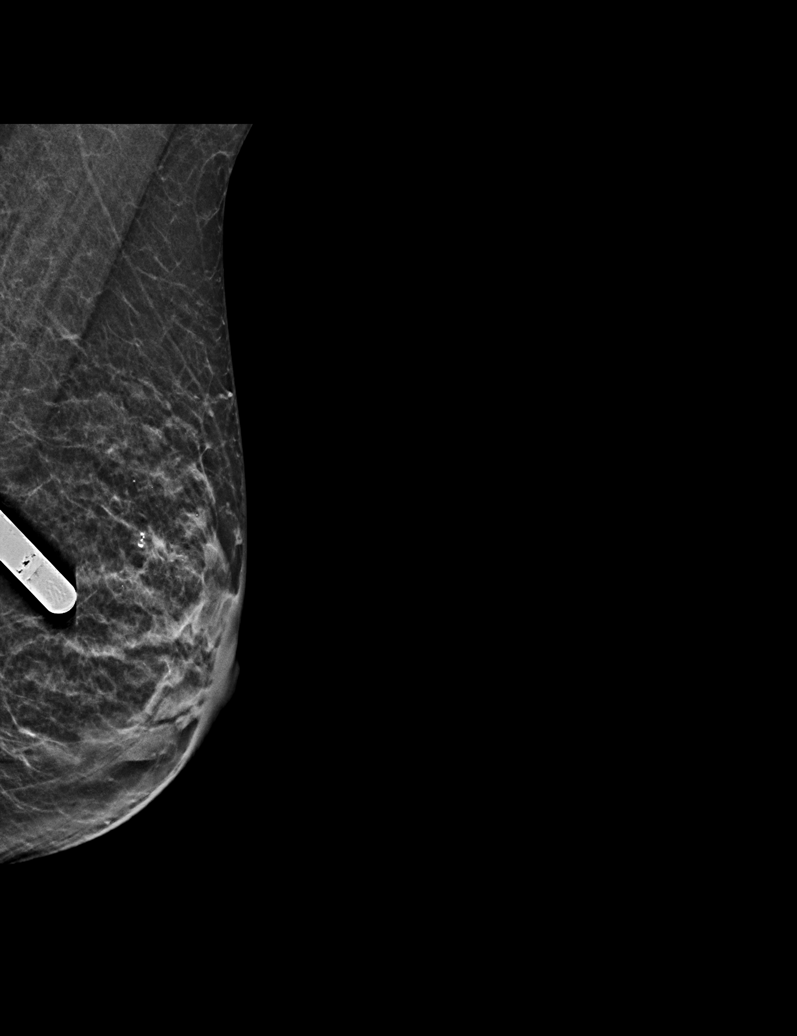

[L CC synth-2D (2 of 2)]
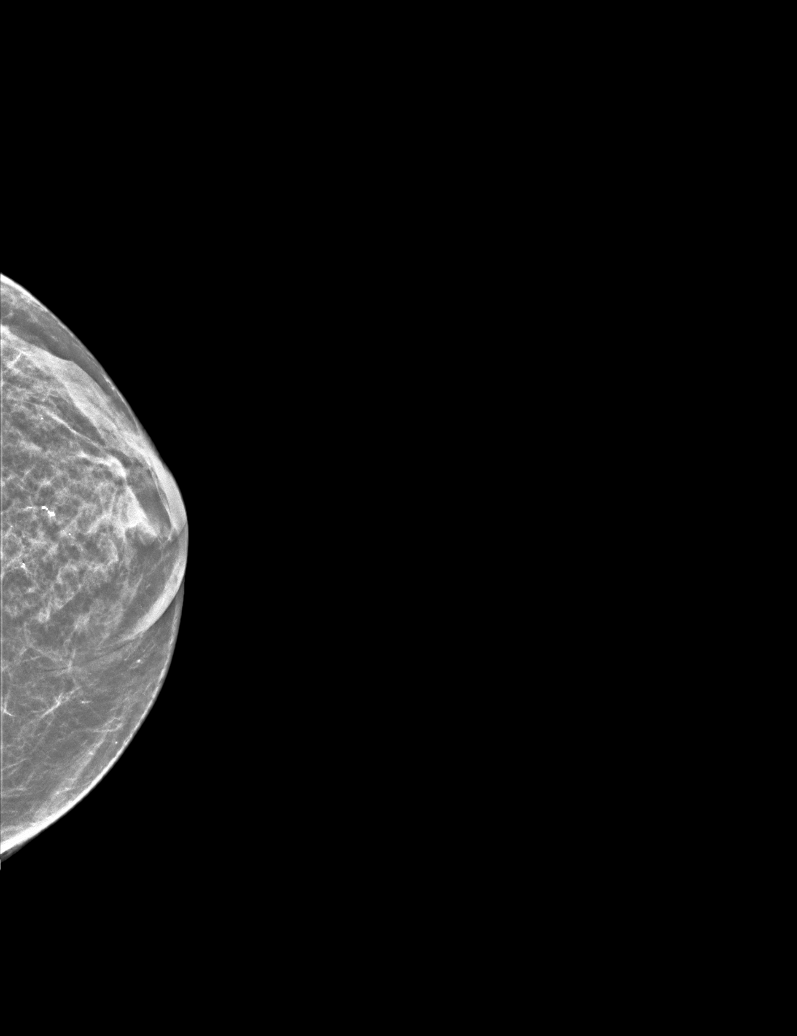

[R MLO synth-2D]
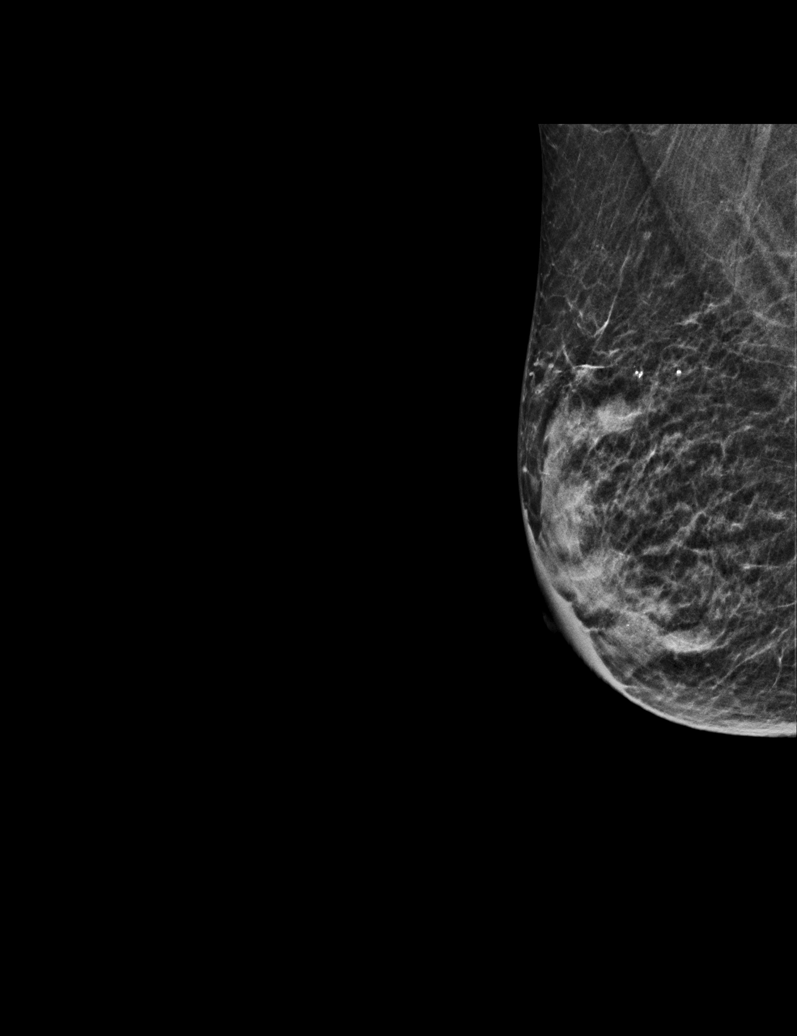

[R CC synth-2D]
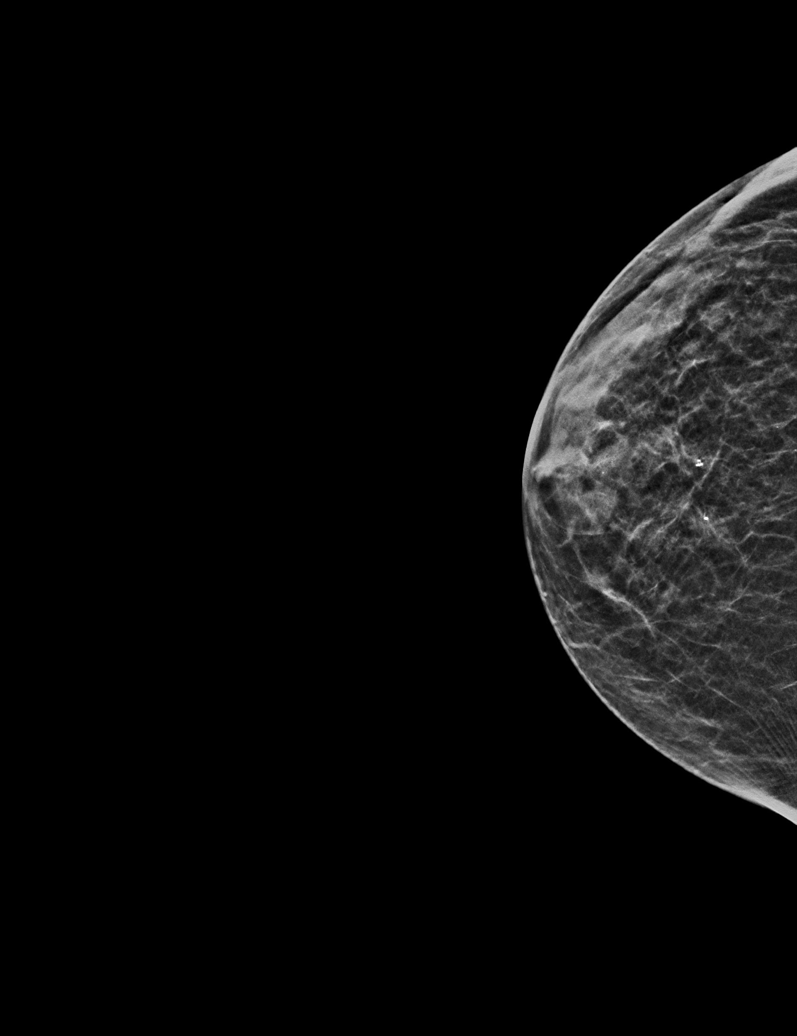

[L MLO tomo · tomo slice 19/38.0]
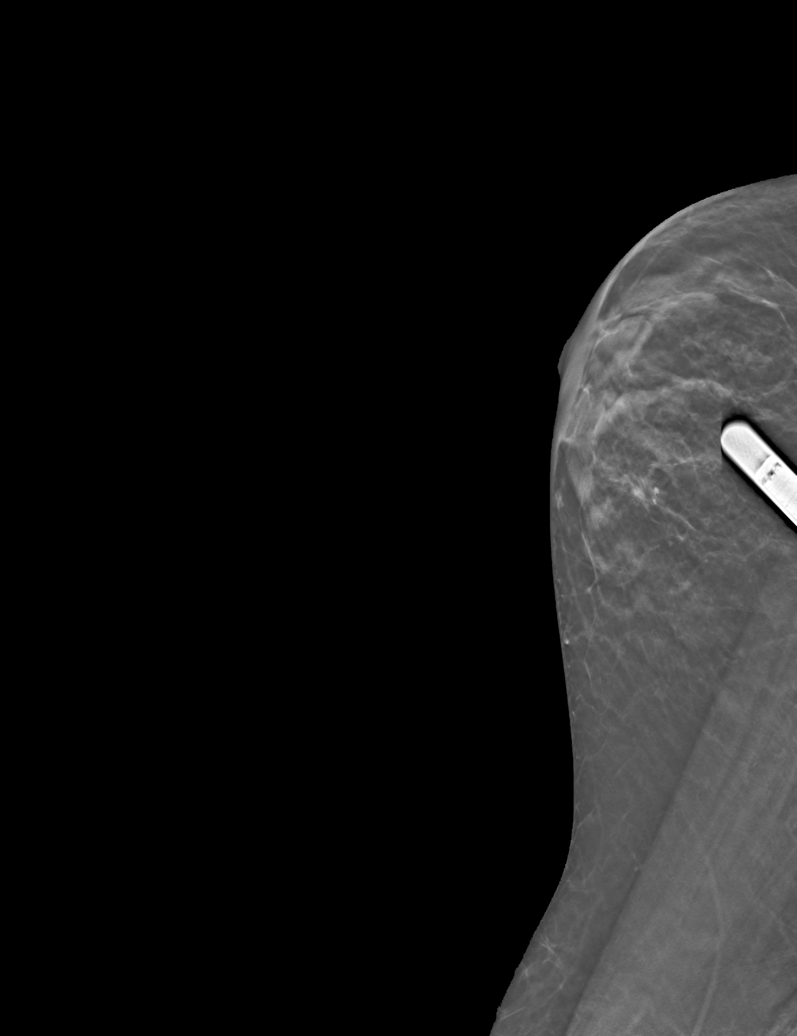

[6 of 30 positions shown; findings below may reference images not displayed]

ACR Breast Density Category c: The breast tissue is heterogeneously
dense, which may obscure small masses.
FINDINGS: There are no findings suspicious for malignancy. Images were
processed with CAD.
IMPRESSION: No mammographic evidence of malignancy. A result letter of this
screening mammogram will be mailed directly to the patient.

RECOMMENDATION:
Screening mammogram in one year. (Code:[id number])

BI-RADS CATEGORY  1: Negative.

## 2022-12-12 ENCOUNTER — Other Ambulatory Visit: Payer: Self-pay | Admitting: Physician Assistant

## 2022-12-12 NOTE — Telephone Encounter (Signed)
Unable to refill per protocol, Rx expired. Medication was discontinued 06/13/22, change in therapy. Will refuse.  Requested Prescriptions  Pending Prescriptions Disp Refills   traZODone (DESYREL) 50 MG tablet 90 tablet 4     Psychiatry: Antidepressants - Serotonin Modulator Passed - 12/12/2022  1:24 PM      Passed - Valid encounter within last 6 months    Recent Outpatient Visits           3 weeks ago Type 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, without long-term current use of insulin ([place])   [facility] [place], [place], PA-C   3 months ago Hypoglycemia   [facility] Jerrol Banana., MD   6 months ago 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, psychotic disturbance, mood disturbance, or anxiety [facility])   [facility] Jerrol Banana., MD   7 months ago Neck mass   [facility] Jerrol Banana., MD   10 months ago Type 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, without long-term current use of insulin [facility])   [facility] Jerrol Banana., MD       Future Appointments             In 2 months [place], Letitia Libra, PA-C [facility], [facility]

## 2022-12-12 NOTE — Telephone Encounter (Signed)
Medication Refill - Medication: Trazodone 50 MG 1 tablet every night at bedtime as needed   Has the patient contacted their pharmacy? Yes.   (Agent: If no, request that the patient contact the pharmacy for the refill. If patient does not wish to contact the pharmacy document the reason why and proceed with request.) (Agent: If yes, when and what did the pharmacy advise?)  Preferred Pharmacy (with phone number or street name):  [facility] - [place], [place] - [address]  [address] [place] [address]  Phone: [phone] Fax: [phone]   Has the patient been seen for an appointment in the last year OR does the patient have an upcoming appointment? Yes.    Agent: Please be advised that RX refills may take up to 3 business days. We ask that you follow-up with your pharmacy.

## 2022-12-16 DIAGNOSIS — I10 Essential (primary) hypertension: Secondary | ICD-10-CM

## 2022-12-18 ENCOUNTER — Ambulatory Visit: Payer: Self-pay | Admitting: *Deleted

## 2022-12-18 ENCOUNTER — Telehealth: Payer: Self-pay | Admitting: Physician Assistant

## 2022-12-18 NOTE — Patient Outreach (Signed)
  Care Coordination   Follow Up Visit Note   12/18/2022 Name: Julie Acevedo MRN: [id number] DOB: 15-Aug-1951  Julie Acevedo is a 72 y.o. year old female who sees [place], [place], [place] for primary care. I spoke with  [name] by phone today.  What matters to the patients health and wellness today?  Ongoing relief of seizures and not falling.     Goals Addressed             This Visit's Progress    Post hospital recovery for seizure   On track    Care Coordination Interventions: Evaluation of current treatment plan related to seizure and patient's adherence to plan as established by provider Advised patient to consider MD recommendations for PT/OT Reviewed scheduled/upcoming provider appointments including neurology follow up tomorrow, 1/3, state daughter will provide transportation.  Mammogram on 1/5 Noted referral for [facility] for HHPT, OT, and SLP, patient state she does not wish to have services.  Educated on benefits of services, encouraged to consider again Denies fall or seizure activity since discharge Has not had hospital follow up with PCP, canceled 12/15 and 12/18.  RNCM requested Care guide to reschedule appointment.           SDOH assessments and interventions completed:  No     Care Coordination Interventions:  Yes, provided   Follow up plan: Follow up call scheduled for 1/29    Encounter Outcome:  Pt. Visit Completed    Valente David, RN, MSN, [place] Care Management Care Management Coordinator [phone]

## 2022-12-18 NOTE — Telephone Encounter (Signed)
[facility] faxed refill request for the following medications:   atorvastatin (LIPITOR) 40 MG tablet    Please advise

## 2022-12-19 DIAGNOSIS — E1122 Type 2 diabetes mellitus with diabetic chronic kidney disease: Secondary | ICD-10-CM | POA: Diagnosis not present

## 2022-12-19 DIAGNOSIS — R569 Unspecified convulsions: Secondary | ICD-10-CM | POA: Diagnosis not present

## 2022-12-19 DIAGNOSIS — G479 Sleep disorder, unspecified: Secondary | ICD-10-CM | POA: Diagnosis not present

## 2022-12-19 DIAGNOSIS — N1832 Chronic kidney disease, stage 3b: Secondary | ICD-10-CM | POA: Diagnosis not present

## 2022-12-20 ENCOUNTER — Other Ambulatory Visit: Payer: Self-pay | Admitting: Family Medicine

## 2022-12-20 IMAGING — CR DG ABDOMEN 1V
1 series · 2 of 2 positions shown · non-contrast
Comparison: 04/17/2019. Abdomen and pelvis CT dated 04/13/2020

CLINICAL DATA: Right flank pain and hematuria for the past 2 weeks.

EXAM:
ABDOMEN - 1 VIEW

[Series 1: dg abd 1 view · 0.14mm/px · 2 of 2 slices shown]
[im 1/2]
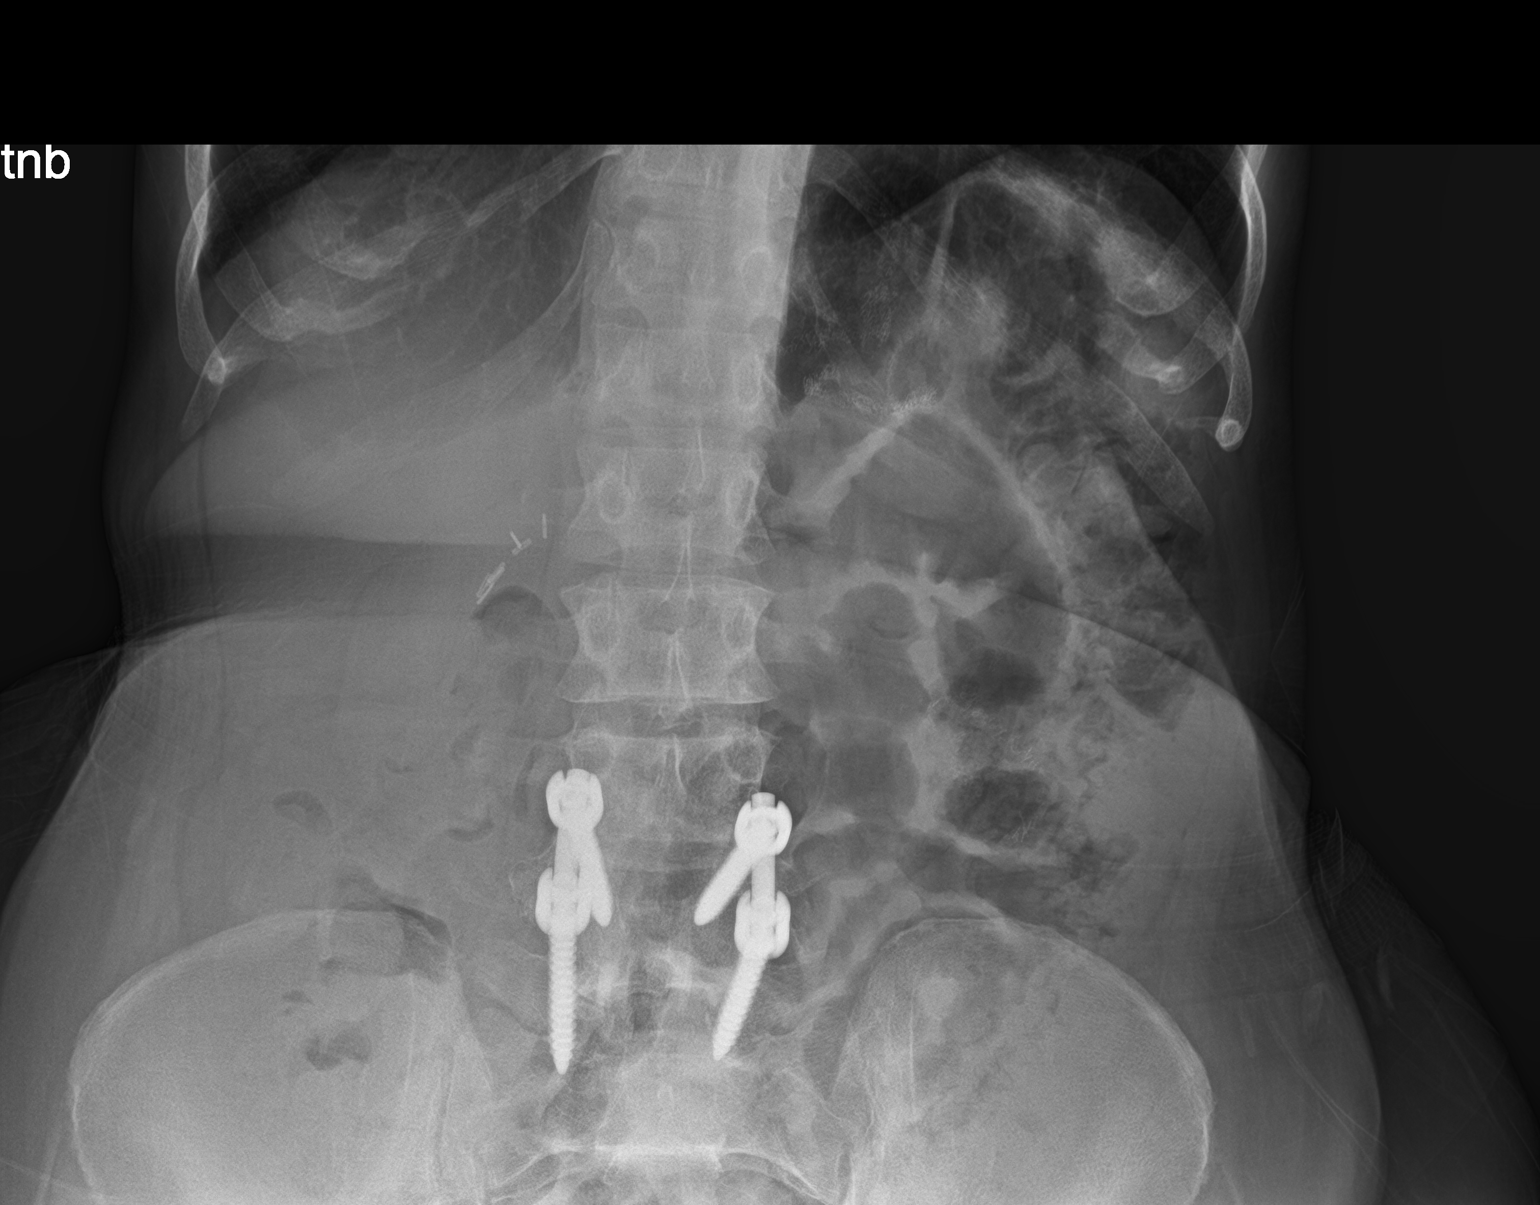
[im 2/2]
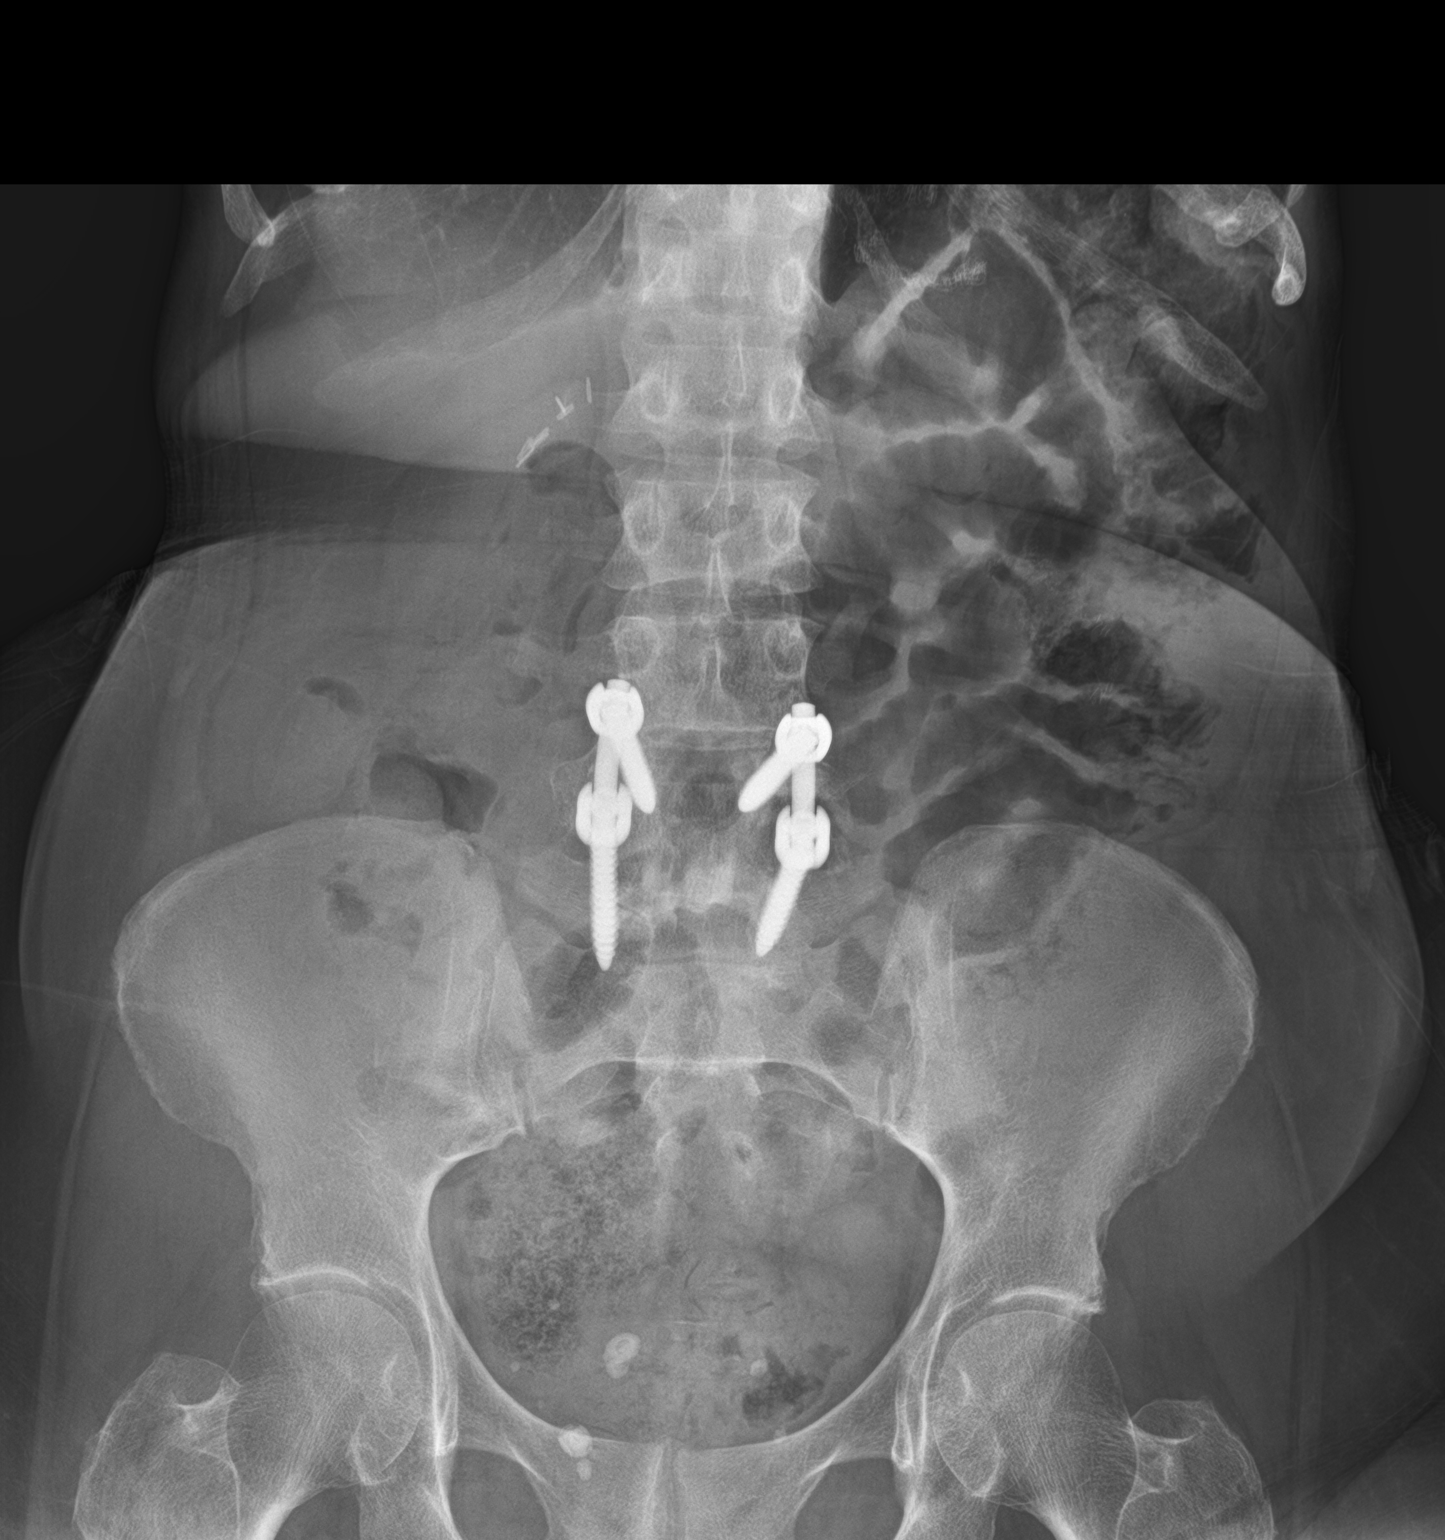

[2 of 2 positions shown; findings below may reference images not displayed]

FINDINGS: Normal bowel gas pattern. Bilateral pelvic phleboliths. No calcified
urinary tract calculi. Cholecystectomy clips. Bilateral L4
laminectomy defects and pedicle screw and rod fixation.
IMPRESSION: No acute abnormality.

## 2022-12-20 MED ORDER — ROSUVASTATIN CALCIUM 40 MG PO TABS: 40.0000 mg | ORAL_TABLET | Freq: Every day | ORAL | 3 refills | Status: DC

## 2022-12-20 NOTE — Telephone Encounter (Signed)
LOV 11/15/22 NOV 12/25/22 Last Refill 09/12/22

## 2022-12-21 ENCOUNTER — Ambulatory Visit
Admission: RE | Admit: 2022-12-21 | Discharge: 2022-12-21 | Disposition: A | Discharge status: Home / Self Care | Payer: Medicare Other | Source: Ambulatory Visit | Attending: Physician Assistant | Admitting: Physician Assistant

## 2022-12-21 DIAGNOSIS — Z1231 Encounter for screening mammogram for malignant neoplasm of breast: Secondary | ICD-10-CM | POA: Insufficient documentation

## 2022-12-24 NOTE — Progress Notes (Signed)
Normal mammogram. You will receive a letter from the MM center.

## 2022-12-25 ENCOUNTER — Encounter: Payer: Self-pay | Admitting: Physician Assistant

## 2022-12-25 ENCOUNTER — Ambulatory Visit (INDEPENDENT_AMBULATORY_CARE_PROVIDER_SITE_OTHER): Payer: Medicare Other | Admitting: Physician Assistant

## 2022-12-25 VITALS — BP 121/69 | HR 72 | Temp 97.9°F | Resp 16 | Wt 133.8 lb

## 2022-12-25 DIAGNOSIS — E440 Moderate protein-calorie malnutrition: Secondary | ICD-10-CM | POA: Diagnosis not present

## 2022-12-25 DIAGNOSIS — R569 Unspecified convulsions: Secondary | ICD-10-CM | POA: Diagnosis not present

## 2022-12-25 DIAGNOSIS — Z9884 Bariatric surgery status: Secondary | ICD-10-CM

## 2022-12-25 DIAGNOSIS — I10 Essential (primary) hypertension: Secondary | ICD-10-CM | POA: Diagnosis not present

## 2022-12-25 DIAGNOSIS — F02A0 Dementia in other diseases classified elsewhere, mild, without behavioral disturbance, psychotic disturbance, mood disturbance, and anxiety: Secondary | ICD-10-CM

## 2022-12-25 DIAGNOSIS — E782 Mixed hyperlipidemia: Secondary | ICD-10-CM | POA: Diagnosis not present

## 2022-12-25 DIAGNOSIS — Z09 Encounter for follow-up examination after completed treatment for conditions other than malignant neoplasm: Secondary | ICD-10-CM

## 2022-12-25 DIAGNOSIS — G3183 Dementia with Lewy bodies: Secondary | ICD-10-CM

## 2022-12-25 DIAGNOSIS — E1122 Type 2 diabetes mellitus with diabetic chronic kidney disease: Secondary | ICD-10-CM

## 2022-12-25 DIAGNOSIS — W19XXXD Unspecified fall, subsequent encounter: Secondary | ICD-10-CM

## 2022-12-25 DIAGNOSIS — N1832 Chronic kidney disease, stage 3b: Secondary | ICD-10-CM

## 2022-12-25 NOTE — Progress Notes (Addendum)
Established patient visit   Patient: Julie Acevedo   DOB: [birth year]   72 y.o. Female  MRN: [id number] Visit Date: 12/25/2022  Today's healthcare provider: Mardene Speak, PA-C   CC: chronic med conditions FU/ hospital discharge FU  Subjective     HPI     Hospitalization Follow-up    Additional comments: Patient was admitted to [facility] 11/19/22-11/21/22. Patient started on Keppra. She reports good compliance and tolerance.       Last edited by Dorian Pod, Potter on 12/25/2022 10:23 AM.       Patient was last seen by neurology on 12/20/22 for head injury, witnessed seizure-like activity; was advised to continue Keppra 500 mg by mouth twice daily and was advised no driving for at least 6 months after seizures. 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  She was recommended to follow-up with Dr. [name].  She was discharged on Keppra, per continuous EEG: No further seizure activity was noted, MRI brain was unremarkable.  Follow up for DMII, HTN, CKD Diabetes Mellitus Type II, Follow-up  Lab Results  Component Value Date   HGBA1C CANCELED 12/25/2022   HGBA1C 5.4 08/09/2022   HGBA1C 5.1 02/13/2022   Wt Readings from Last 3 Encounters:  12/26/22 130 lb (59 kg)  12/25/22 133 lb 12.8 oz (60.7 kg)  11/19/22 126 lb 1.7 oz (57.2 kg)    Management since then includes lifestyle modifications. She reports fair compliance with treatment. She is not having side effects.  Symptoms: No fatigue No foot  ulcerations  Yes appetite changes No nausea  No paresthesia of the feet  No polydipsia  No polyuria No visual disturbances   No vomiting     Home blood sugar records: none  Current exercise: walking Current diet habits: in general, a "healthy" diet    Pertinent Labs: Lab Results  Component Value Date   CHOL 134 09/06/2021   HDL 60 09/06/2021   LDLCALC 63 09/06/2021   TRIG 54 09/06/2021   CHOLHDL 2.2 09/06/2021   Lab Results  Component Value Date   NA WILL FOLLOW 12/25/2022   K WILL FOLLOW 12/25/2022   CREATININE WILL FOLLOW 12/25/2022   EGFR WILL FOLLOW 12/25/2022   LABMICR WILL FOLLOW 12/25/2022   MICRALBCREAT WILL FOLLOW 12/25/2022     Hypertension, follow-up  BP Readings from Last 3 Encounters:  12/26/22 130/78  12/25/22 121/69  11/21/22 124/61   Wt Readings from Last 3 Encounters:  12/26/22 130 lb (59 kg)  12/25/22 133 lb 12.8 oz (60.7 kg)  11/19/22 126 lb 1.7 oz (57.2 kg)      BP at that visit was 138/74. Management since that visit includes metoprolol succinate ER 25 Mg.  She reports fair compliance with treatment. She is not having side effects.  She is following a Low fat, Low Sodium diet. She  is walking   She does not smoke.  Outside blood pressures: does not measure. Symptoms: No chest pain No chest pressure  No palpitations No syncope  No dyspnea No orthopnea  No paroxysmal nocturnal dyspnea No lower extremity edema   Pertinent  labs Lab Results  Component Value Date   CHOL 134 09/06/2021   HDL 60 09/06/2021   LDLCALC 63 09/06/2021   TRIG 54 09/06/2021   CHOLHDL 2.2 09/06/2021   Lab Results  Component Value Date   NA WILL FOLLOW 12/25/2022   K WILL FOLLOW 12/25/2022   CREATININE WILL FOLLOW 12/25/2022   EGFR WILL FOLLOW 12/25/2022   GLUCOSE WILL FOLLOW 12/25/2022   TSH 3.012 08/08/2022     The ASCVD Risk score (Arnett DK, et al., 2019) failed to calculate for the following reasons:   The patient has a prior MI or stroke  diagnosis  ---------------------------------------------------------------------------------------------------  Lipid/Cholesterol, Follow-up  Last lipid panel Other pertinent labs  Lab Results  Component Value Date   CHOL 134 09/06/2021   HDL 60 09/06/2021   LDLCALC 63 09/06/2021   TRIG 54 09/06/2021   CHOLHDL 2.2 09/06/2021   Lab Results  Component Value Date   ALT 36 11/19/2022   AST 37 11/19/2022   PLT 223 11/20/2022   TSH 3.012 08/08/2022     She was last seen for this 3 months ago.  Management since that visit includes atorvastatin 40 mg, now rosuvastatin 40 mg  She reports fair compliance with treatment. She is not having side effects.   Symptoms: No chest pain No chest pressure/discomfort  No dyspnea No lower extremity edema  No numbness or tingling of extremity No orthopnea  No palpitations No paroxysmal nocturnal dyspnea  No speech difficulty No syncope   Current diet: in general, a "healthy" diet   Current exercise: walking  The ASCVD Risk score (Arnett DK, et al., 2019) failed to calculate for the following reasons:   The patient has a prior MI or stroke diagnosis  ---------------------------------------------------------------------------------------------------   ---------------------------------------------------------------------------------------------------  Ophthalmology check out on February 1st.  Medications: Outpatient Medications Prior to Visit  Medication Sig   ACCU-CHEK GUIDE test strip USE TO CHECK BLOOD SUGAR UP TO 4 TIMES A DAY AS DIRECTED   Accu-Chek Softclix Lancets lancets SMARTSIG:Topical 1-4 Times Daily   blood glucose meter kit and supplies Dispense Accu-Chek Guide. Use up to four times daily as directed. 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   calcitRIOL (ROCALTROL) 0.25 MCG capsule Take 0.25 mcg by mouth daily.   gabapentin (NEURONTIN) 300 MG capsule Take 1 capsule (300 mg total) by mouth 3 (three) times daily.   Glucagon (GVOKE  HYPOPEN 2-PACK) 1 MG/0.2ML SOAJ Inject 0.2 mLs into the skin as needed.   hydrOXYzine (VISTARIL) 100 MG capsule Take 1 capsule (100 mg total) by mouth 3 (three) times daily.   levETIRAcetam (KEPPRA) 500 MG tablet Take 1 tablet (500 mg total) by mouth 2 (two) times daily.   linaclotide (LINZESS) 290 MCG CAPS capsule TAKE 1 CAPSULE BY MOUTH EVERY DAY BEFORE BREAKFAST (Patient taking differently: Take 290 mcg by mouth daily before breakfast. TAKE 1 CAPSULE BY MOUTH EVERY DAY BEFORE BREAKFAST)   metFORMIN (GLUCOPHAGE) 500 MG tablet Take by mouth 2 (two) times daily with a meal.   metoprolol succinate (TOPROL-XL) 25 MG 24 hr tablet TAKE 1 TABLET (25 MG TOTAL) BY MOUTH DAILY.   mirtazapine (REMERON) 30 MG tablet TAKE ONE TABLET BY MOUTH EVERYDAY AT BEDTIME   omeprazole (PRILOSEC) 20 MG capsule Take 1 capsule (20 mg total) by mouth daily.   ondansetron (ZOFRAN) 4 MG tablet TAKE 1 TABLET BY MOUTH EVERY 8 HOURS AS NEEDED (Patient taking differently: Take 4 mg by mouth every 8 (eight) hours as needed for nausea or vomiting.)  polyethylene glycol powder (MIRALAX) 17 GM/SCOOP powder Take 1 scoop or 17 g 3 times a day for 2 days then use 1 scoop twice a day a day until you begin having soft bowel movements then cut back to 1 scoop a day.   rivastigmine (EXELON) 3 MG capsule Take 3 mg by mouth 2 (two) times daily.   rosuvastatin (CRESTOR) 40 MG tablet Take 1 tablet (40 mg total) by mouth daily.   traMADol (ULTRAM) 50 MG tablet Take 1 tablet (50 mg total) by mouth every 6 (six) hours as needed for moderate pain or severe pain.   triamcinolone cream (KENALOG) 0.5 % Apply 1 Application topically 3 (three) times daily as needed (flares).   No facility-administered medications prior to visit.    Review of Systems  All other systems reviewed and are negative. Except see HPI     Objective    BP 121/69 (BP Location: Left Arm, Patient Position: Sitting, Cuff Size: Normal)   Pulse 72   Temp 97.9 F (36.6 C)  (Temporal)   Resp 16   Wt 133 lb 12.8 oz (60.7 kg)   SpO2 100%   BMI 23.70 kg/m    Physical Exam Vitals reviewed.  Constitutional:      General: She is not in acute distress.    Appearance: Normal appearance. She is well-developed. She is not diaphoretic.  HENT:     Head: Normocephalic and atraumatic.  Eyes:     General: No scleral icterus.    Conjunctiva/sclera: Conjunctivae normal.  Neck:     Thyroid: No thyromegaly.  Cardiovascular:     Rate and Rhythm: Normal rate and regular rhythm.     Pulses: Normal pulses.          Dorsalis pedis pulses are 2+ on the right side and 2+ on the left side.       Posterior tibial pulses are 2+ on the right side and 2+ on the left side.     Heart sounds: Normal heart sounds. No murmur heard. Pulmonary:     Effort: Pulmonary effort is normal. No respiratory distress.     Breath sounds: Normal breath sounds. No wheezing, rhonchi or rales.  Musculoskeletal:        General: Swelling (ankle, chronic, mild?) present. No tenderness, deformity or signs of injury.     Cervical back: Neck supple.     Right lower leg: No edema.     Left lower leg: No edema.     Right foot: Normal range of motion. No deformity, bunion, Charcot foot or foot drop.     Left foot: Normal range of motion. No deformity, bunion, Charcot foot or foot drop.  Feet:     Right foot:     Protective Sensation: 8 sites tested.  8 sites sensed.     Skin integrity: Skin integrity normal.     Toenail Condition: Right toenails are long.     Left foot:     Protective Sensation: 8 sites tested.  8 sites sensed.     Skin integrity: Skin integrity normal.     Toenail Condition: Left toenails are long.  Lymphadenopathy:     Cervical: No cervical adenopathy.  Skin:    General: Skin is warm and dry.     Findings: No rash.  Neurological:     Mental Status: She is alert and oriented to person, place, and time. Mental status is at baseline.  Psychiatric:        Behavior: Behavior  normal.        Thought Content: Thought content normal.        Judgment: Judgment normal.      Results for orders placed or performed in visit on 25/85/27  Basic metabolic panel  Result Value Ref Range   Glucose WILL FOLLOW    BUN WILL FOLLOW    Creatinine, Ser WILL FOLLOW    eGFR WILL FOLLOW    BUN/Creatinine Ratio WILL FOLLOW    Sodium WILL FOLLOW    Potassium WILL FOLLOW    Chloride WILL FOLLOW    CO2 WILL FOLLOW    Calcium WILL FOLLOW   Hemoglobin A1c  Result Value Ref Range   Hgb A1c MFr Bld CANCELED %   Est. 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 [name]   Benign essential HTN Chronic and stable Her BP today WNL - Basic metabolic panel - Hemoglobin A1c - Microalbumin / creatinine urine ratio Continue current regimen, lifestyle modifications Including low salt diet and daily exercise  Malnutrition of moderate degree Gastric bypass status for obesity/setting Chronic and stable Appetite improving Pt was advised to continue taking mirtazapine - Basic metabolic panel - Hemoglobin A1c - Microalbumin / creatinine urine ratio Pt was advised to adhere to healthy diet including activia yogurt and black cherry her favorites  Mixed hyperlipidemia Chronic and stable Continue rosuvastatin 40 mg, will switch from atorvastatin on 12/20/2022? Continue low-fat diet and  exercise as tolerated Will reassess  Type 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, without long-term current use of insulin (HCC) Chronic and stable Last A1c from 08/09/2022 was 5.4 Dr. Rosanna Randy on discontinue if you DM medications after hypoglycemic episode - Basic metabolic panel - Hemoglobin A1c - Microalbumin / creatinine urine ratio Advised to continue lifestyle modifications via diet and exercise as tolerated Will reassess at follow-up  Stage 3b chronic kidney disease ([place]) Chronic and stable Reviewed labs results over the last 4 months and reassured patient that his renal functions have been very consistent. Creatinine range between 1. 65 and 1. 35, last GFR was 39 on 78/01/4234 - Basic metabolic panel - Hemoglobin A1c  She needs to drink 8-12 glasses of water every day, avoid NSAIDs, and have renal functions check  to ensure stability.       The patient was advised to call back or seek an in-person evaluation if the symptoms worsen or if the condition fails to improve as anticipated.  I discussed the assessment and treatment plan with the patient. The patient was provided an opportunity to ask questions and all were answered. The patient agreed with the plan and demonstrated an understanding of the instructions.  The entirety of the information documented in the History of Present Illness, Review of Systems and Physical Exam were personally obtained by me. Portions of this information were initially documented by the CMA and reviewed by me for thoroughness and accuracy.  Letitia Libra  Cheryll Cockayne [facility], [place] [phone] (phone) [phone] (fax)

## 2022-12-26 ENCOUNTER — Other Ambulatory Visit: Payer: Self-pay

## 2022-12-26 ENCOUNTER — Emergency Department: Payer: 59

## 2022-12-26 ENCOUNTER — Encounter: Payer: Self-pay | Admitting: Oncology

## 2022-12-26 ENCOUNTER — Emergency Department
Admission: EM | Admit: 2022-12-26 | Discharge: 2022-12-26 | Disposition: A | Discharge status: Home / Self Care | Payer: 59 | Attending: Emergency Medicine | Admitting: Emergency Medicine

## 2022-12-26 DIAGNOSIS — W208XXA Other cause of strike by thrown, projected or falling object, initial encounter: Secondary | ICD-10-CM | POA: Insufficient documentation

## 2022-12-26 DIAGNOSIS — S9032XA Contusion of left foot, initial encounter: Secondary | ICD-10-CM | POA: Insufficient documentation

## 2022-12-26 DIAGNOSIS — M7989 Other specified soft tissue disorders: Secondary | ICD-10-CM | POA: Diagnosis not present

## 2022-12-26 DIAGNOSIS — S99922A Unspecified injury of left foot, initial encounter: Secondary | ICD-10-CM | POA: Diagnosis present

## 2022-12-26 NOTE — ED Triage Notes (Signed)
Pt states spaghetti bottle fell on left foot last night and is now having pain.

## 2022-12-26 NOTE — ED Provider Triage Note (Signed)
Emergency Medicine Provider Triage Evaluation Note  Julie Acevedo , a 72 y.o. female  was evaluated in triage.  Pt complains of here with complaint of left foot pain.  Patient states that a jar of spaghetti sauce fell on her foot last evening.  She continues to have pain today..  Review of Systems  Positive:  Negative:   Physical Exam  BP 130/78 (BP Location: Left Arm)   Pulse 80   Temp 98.4 F (36.9 C) (Oral)   Resp 20   Ht '5\' 3"'$  (1.6 m)   Wt 59 kg   SpO2 98%   BMI 23.03 kg/m  Gen:   Awake, no distress   Resp:  Normal effort  MSK:   Painful left foot. Other:    Medical Decision Making  Medically screening exam initiated at 7:59 AM.  Appropriate orders placed.  MATTESON BLUE was informed that the remainder of the evaluation will be completed by another provider, this initial triage assessment does not replace that evaluation, and the importance of remaining in the ED until their evaluation is complete.     Johnn Hai, PA-C 12/26/22 0800

## 2022-12-26 NOTE — Discharge Instructions (Addendum)
Ice and elevation.  Follow-up with your primary care provider if any continued problems or concerns.  Continue taking the tramadol as needed for pain.

## 2022-12-26 NOTE — ED Provider Notes (Signed)
[facility] Provider Note    None    (approximate)   History   Foot Injury   HPI  Julie Acevedo is a 72 y.o. female   presents to the ED with complaint of left foot pain after a jar of spaghetti sauce fell on her foot last evening.  Patient continues to have pain today.  Patient is ambulatory with a limping gait.    Physical Exam   Triage Vital Signs: ED Triage Vitals [12/26/22 0758]  Enc Vitals Group     BP 130/78     Pulse Rate 80     Resp 20     Temp 98.4 F (36.9 C)     Temp Source Oral     SpO2 98 %     Weight 130 lb (59 kg)     Height '5\' 3"'$  (1.6 m)     Head Circumference      Peak Flow      Pain Score 10     Pain Loc      Pain Edu?      Excl. in [place]?     Most recent vital signs: Vitals:   12/26/22 0758  BP: 130/78  Pulse: 80  Resp: 20  Temp: 98.4 F (36.9 C)  SpO2: 98%     General: Awake, no distress.  CV:  Good peripheral perfusion.  Resp:  Normal effort.  Abd:  No distention.  Other:  Left foot markedly tender to palpation.  No deformity noted.   ED Results / Procedures / Treatments   Labs (all labs ordered are listed, but only abnormal results are displayed) Labs Reviewed - No data to display    RADIOLOGY X-ray images of the left foot without acute fracture.  There is an old fracture of the fifth metatarsal and previous surgery and hardware noted.  The radiology report agrees with no fracture but mentions soft tissue edema over the great toe and dorsum of the foot.    PROCEDURES:  Critical Care performed:   Procedures   MEDICATIONS ORDERED IN ED: Medications - No data to display   IMPRESSION / MDM / [place] / ED COURSE  I reviewed the triage vital signs and the nursing notes.   Differential diagnosis includes, but is not limited to, fracture left foot, contusion, dislocation.  72 year old female presents to the ED with complaint of left foot pain after a jar of spaghetti sauce fell on  her foot.  X-rays were reassuring and patient was made aware that she does not have an acute fracture.  She was encouraged to ice and elevate her foot.  Patient already has a prescription for tramadol at home that she can take for pain that was written by her PCP.  Patient was offered a postop shoe but declined stating that the slipper she has on is more comfortable.  He is to follow-up with her PCP if any continued problems.    Patient's presentation is most consistent with acute complicated illness / injury requiring diagnostic workup.  FINAL CLINICAL IMPRESSION(S) / ED DIAGNOSES   Final diagnoses:  Contusion of left foot, initial encounter     Rx / DC Orders   ED Discharge Orders     None        Note:  This document was prepared using Dragon voice recognition software and may include unintentional dictation errors.   Johnn Hai, PA-C 12/26/22 1122    Rada Hay, MD 12/26/22  1144  

## 2022-12-27 LAB — MICROALBUMIN / CREATININE URINE RATIO

## 2022-12-27 LAB — BASIC METABOLIC PANEL

## 2022-12-28 LAB — BASIC METABOLIC PANEL

## 2023-01-01 ENCOUNTER — Telehealth: Payer: Self-pay

## 2023-01-01 NOTE — Progress Notes (Unsigned)
Care Management & Coordination Services Pharmacy Note  01/01/2023 Name:  Julie Acevedo MRN:  [id number] DOB:  [birth year]  Summary: ***  Recommendations/Changes made from today's visit: ***  Follow up plan: ***   Subjective: Julie Acevedo is an 72 y.o. year old female who is a primary patient of Pcp, No.  The care coordination team was consulted for assistance with disease management and care coordination needs.    Engaged with patient by telephone for follow up visit.  Recent office visits: 12/25/22: Patient presented to Mardene Speak, PA-C for follow-up.  11/15/22: Patient presented to Mardene Speak, PA-C for follow-up.    Recent consult visits: 12/19/22: Patient presented to Dr. [name] (Neurology) for follow-up.  11/30/22: Patient presented to Kerry Dory, [place] (Neurology) for follow-up.  05/24/22: Patient presented to Dr. Lanora Manis (Nephrology) for follow-up.  05/04/22: Patient presented to Dr. [name] (Neurology) for follow-up. Stop trazodone, start mirtazapine 15 mg nightly.   Hospital visits: 11/19/22: Patient hospitalized for fall.   12/26/22: Patient presented to [facility] for contusion.    Objective:  Lab Results  Component Value Date   CREATININE CANCELED 12/25/2022   BUN CANCELED 12/25/2022   EGFR 40 (L) 09/13/2021   GFRNONAA 39 (L) 11/20/2022   GFRAA 42 (L) 12/05/2020   NA CANCELED 12/25/2022   K CANCELED 12/25/2022   CALCIUM CANCELED 12/25/2022   CO2 CANCELED 12/25/2022   GLUCOSE CANCELED 12/25/2022    Lab Results  Component Value Date/Time   HGBA1C CANCELED 12/25/2022 11:08 AM   HGBA1C 5.4 08/09/2022 06:40 PM   HGBA1C 6.3 01/18/2014 06:32 AM   MICROALBUR 20 06/23/2020 02:10 PM   MICROALBUR 50 11/18/2017 09:33 AM    Last diabetic Eye exam:  Lab Results  Component Value Date/Time   HMDIABEYEEXA No Retinopathy 12/03/2019 12:00 AM    Last diabetic Foot exam: No results found for: "HMDIABFOOTEX"   Lab Results  Component Value Date   CHOL 134 09/06/2021   HDL  60 09/06/2021   LDLCALC 63 09/06/2021   TRIG 54 09/06/2021   CHOLHDL 2.2 09/06/2021       Latest Ref Rng & Units 11/19/2022   12:19 PM 08/31/2022    6:45 AM 08/30/2022    8:42 PM  Hepatic Function  Total Protein 6.5 - 8.1 g/dL 6.7  6.2  7.2   Albumin 3.5 - 5.0 g/dL 3.5  3.0  3.7   AST 15 - 41 U/L 37  48  57   ALT 0 - 44 U/L 36  47  58   Alk Phosphatase 38 - 126 U/L 151  149  161   Total Bilirubin 0.3 - 1.2 mg/dL 0.1  0.4  0.5     Lab Results  Component Value Date/Time   TSH 3.012 08/08/2022 12:43 PM   TSH 1.896 06/28/2021 01:23 PM   TSH 3.730 12/05/2020 08:55 AM   TSH 2.170 08/18/2020 11:36 AM   FREET4 0.91 06/28/2021 01:23 PM       Latest Ref Rng & Units 11/20/2022    3:29 AM 11/19/2022   12:20 PM 11/19/2022   12:19 PM  CBC  WBC 4.0 - 10.5 K/uL 6.1   5.5   Hemoglobin 12.0 - 15.0 g/dL 11.0  11.6  10.8   Hematocrit 36.0 - 46.0 % 36.1  34.0  33.8   Platelets 150 - 400 K/uL 223   247     Lab Results  Component Value Date/Time   VD25OH 51.23 08/09/2022 06:40 PM   VD25OH 8.7 (  L) 06/16/2019 03:51 PM   VITAMINB12 496 08/09/2022 06:40 PM   VITAMINB12 395 06/02/2021 11:03 AM    Clinical ASCVD: {YES/NO:21197} The ASCVD Risk score (Arnett DK, et al., 2019) failed to calculate for the following reasons:   The patient has a prior MI or stroke diagnosis    ***Other: (CHADS2VASc if Afib, MMRC or CAT for COPD, ACT, DEXA)     12/25/2022   10:23 AM 09/20/2022    3:37 PM 04/24/2022    1:17 PM  Depression screen PHQ 2/9  Decreased Interest 0 0 0  Down, Depressed, Hopeless 0 0 0  PHQ - 2 Score 0 0 0  Altered sleeping 0    Tired, decreased energy 0    Change in appetite 0    Feeling bad or failure about yourself  0    Trouble concentrating 0    Moving slowly or fidgety/restless 0    Suicidal thoughts 0    PHQ-9 Score 0    Difficult doing work/chores Not difficult at all       Social History   Tobacco Use  Smoking Status Never  Smokeless Tobacco Never   BP Readings from  Last 3 Encounters:  12/26/22 130/78  12/25/22 121/69  11/21/22 124/61   Pulse Readings from Last 3 Encounters:  12/26/22 80  12/25/22 72  11/21/22 66   Wt Readings from Last 3 Encounters:  12/26/22 130 lb (59 kg)  12/25/22 133 lb 12.8 oz (60.7 kg)  11/19/22 126 lb 1.7 oz (57.2 kg)   BMI Readings from Last 3 Encounters:  12/26/22 23.03 kg/m  12/25/22 23.70 kg/m  11/19/22 22.34 kg/m    Allergies  Allergen Reactions   Lotrel [Amlodipine Besy-Benazepril Hcl] Anaphylaxis   Contrast Media [Iodinated Contrast Media] Swelling and Other (See Comments)    Causes neck swelling    Niacin Hives   Sulfa Antibiotics Hives    Medications Reviewed Today     Reviewed by Mardene Speak, PA-C (Physician Assistant Certified) on 75/10/25 at [place] List Status: <None>   Medication Order Taking? Sig Documenting Provider Last Dose Status Informant  ACCU-CHEK GUIDE test strip [id number] Yes USE TO CHECK BLOOD SUGAR UP TO 4 TIMES A DAY AS DIRECTED Ostwalt, Janna, PA-C Taking Active   Accu-Chek Softclix Lancets lancets [id number] Yes SMARTSIG:Topical 1-4 Times Daily [provider] Taking Active Spouse/Significant Other, Pharmacy Records  blood glucose meter kit and supplies [id number] Yes Dispense Accu-Chek Guide. Use up to four times daily as directed. 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 Jerrol Banana., MD Taking Active Spouse/Significant Other, Pharmacy Records  calcitRIOL (ROCALTROL) 0.25 MCG capsule [id number] Yes Take 0.25 mcg by mouth daily. [provider] Taking Active Spouse/Significant Other, Pharmacy Records           Med Note Geanie Logan [facility] Ronald Lobo Nov 19, 2022  5:07 PM) Pt spouse brought in this medication.  gabapentin (NEURONTIN) 300 MG capsule [id number] Yes Take 1 capsule (300 mg total) by mouth 3 (three) times daily. Jerrol Banana., MD Taking Active Spouse/Significant Other, Pharmacy Records           Med Note Geanie Logan, [facility] Ronald Lobo Nov 19, 2022  5:07 PM)  Pt spouse brought in this medication.  Glucagon (GVOKE HYPOPEN 2-PACK) 1 MG/0.2ML SOAJ [id number] Yes Inject 0.2 mLs into the skin as needed. Jerrol Banana., MD Taking Active Spouse/Significant Other, Pharmacy Records           Med  Note Darral Dash   Mon Nov 19, 2022  5:04 PM) Recent Dispenses     08/21/2022 1 MG/0.2ML SOAJ (disp .4, 2d supply)    hydrOXYzine (VISTARIL) 100 MG capsule [id number] Yes Take 1 capsule (100 mg total) by mouth 3 (three) times daily. Jerrol Banana., MD Taking Active Spouse/Significant Other, Pharmacy Records           Med Note Geanie Logan, [facility] Ronald Lobo Nov 19, 2022  5:08 PM) Pt spouse brought in this medication.  levETIRAcetam (KEPPRA) 500 MG tablet [id number] Yes Take 1 tablet (500 mg total) by mouth 2 (two) times daily. Bonnielee Haff, MD Taking Active   linaclotide Rolan Lipa) 290 MCG CAPS capsule [id number] Yes TAKE 1 [place]  Patient taking differently: Take 290 mcg by mouth daily before breakfast. TAKE 1 CAPSULE BY MOUTH EVERY DAY BEFORE BREAKFAST   Jerrol Banana., MD Taking Active Spouse/Significant Other, Pharmacy Records           Med Note Geanie Logan, [facility] Ronald Lobo Nov 19, 2022  5:07 PM) Pt spouse brought in this medication.  metFORMIN (GLUCOPHAGE) 500 MG tablet [id number] Yes Take by mouth 2 (two) times daily with a meal. [provider] Taking Active Spouse/Significant Other, Pharmacy Records           Med Note Geanie Logan [facility] Ronald Lobo Nov 19, 2022  6:28 PM) Pt spouse brought in this medication.  metoprolol succinate (TOPROL-XL) 25 MG 24 hr tablet [id number] Yes TAKE 1 TABLET (25 MG TOTAL) BY MOUTH DAILY. Jerrol Banana., MD Taking Active Spouse/Significant Other, Pharmacy Records           Med Note Geanie Logan, [facility] Ronald Lobo Nov 19, 2022  6:24 PM) Pt and Pt's spouse did not know if Pt had medication today.  Pt's spouse claim Pt had medications last night.  Pt's spouse brought in this  medication.  Recent Dispenses     09/05/2022 25 MG TB24 (disp 90, 90d supply)  mirtazapine (REMERON) 30 MG tablet [id number] Yes TAKE ONE TABLET BY MOUTH EVERYDAY AT BEDTIME Simmons-Robinson, Riki Sheer, MD Taking Active Spouse/Significant Other, Pharmacy Records           Med Note Geanie Logan [facility] Ronald Lobo Nov 19, 2022  5:05 PM) Recent Dispenses     11/13/2022 30 MG TABS (disp 30, 30d supply)    omeprazole (PRILOSEC) 20 MG capsule [id number] Yes Take 1 capsule (20 mg total) by mouth daily. Jerrol Banana., MD Taking Active Spouse/Significant Other, Pharmacy Records           Med Note Geanie Logan, [facility] Ronald Lobo Nov 19, 2022  5:06 PM) Pt spouse brought in this medication.  ondansetron (ZOFRAN) 4 MG tablet [id number] Yes TAKE 1 TABLET BY MOUTH EVERY 8 HOURS AS NEEDED  Patient taking differently: Take 4 mg by mouth every 8 (eight) hours as needed for nausea or vomiting.   Jerrol Banana., MD Taking Active Spouse/Significant Other, Pharmacy Records  polyethylene glycol powder [facility]) 17 GM/SCOOP powder [id number] Yes Take 1 scoop or 17 g 3 times a day for 2 days then use 1 scoop twice a day a day until you begin having soft bowel movements then cut back to 1 scoop a day. Nena Polio, MD Taking Active Spouse/Significant Other, Pharmacy Records  rivastigmine (EXELON) 3 MG capsule [id number] Yes Take 3 mg by mouth 2 (two) times  daily. [provider] Taking Active Spouse/Significant Other, Pharmacy Records           Med Note Geanie Logan [facility] Ronald Lobo Nov 19, 2022  5:06 PM) Pt spouse brought in this medication.  rosuvastatin (CRESTOR) 40 MG tablet [id number] Yes Take 1 tablet (40 mg total) by mouth daily. Gwyneth Sprout, FNP Taking Active   traMADol Veatrice Bourbon) 50 MG tablet [id number] Yes Take 1 tablet (50 mg total) by mouth every 6 (six) hours as needed for moderate pain or severe pain. Jerrol Banana., MD Taking Active Spouse/Significant Other, Pharmacy Records           Med Note  Geanie Logan, [facility] Ronald Lobo Nov 19, 2022  5:05 PM)  Recent Dispenses      10/24/2022 50 MG TABS (disp 100, 25d supply)     triamcinolone cream (KENALOG) 0.5 % [id number] Yes Apply 1 Application topically 3 (three) times daily as needed (flares). [provider] Taking Active Spouse/Significant Other, Pharmacy Records            SDOH:  (Social Determinants of Health) assessments and interventions performed: {yes/no:20286} SDOH Interventions    Flowsheet Row Telephone from 11/23/2022 in [place] Most recent reading at 11/23/2022 12:37 PM Care Coordination from 09/25/2022 in [place] Most recent reading at 09/25/2022 10:50 AM Care Coordination from 09/20/2022 in [place] Most recent reading at 09/20/2022  3:33 PM Clinical Support from 03/28/2022 in [facility] Most recent reading at 03/28/2022  8:31 AM Chronic Care Management from 12/26/2021 in [facility] Most recent reading at 01/01/2022 10:11 AM Patient Outreach Telephone from 12/26/2021 in [facility] Most recent reading at 12/26/2021 11:13 AM  SDOH Interventions        Food Insecurity Interventions Intervention Not Indicated Intervention Not Indicated Intervention Not Indicated Intervention Not Indicated -- --  Housing Interventions Intervention Not Indicated -- Intervention Not Indicated Intervention Not Indicated -- --  Transportation Interventions -- -- Intervention Not Indicated Intervention Not Indicated -- --  Utilities Interventions Intervention Not Indicated Intervention Not Indicated Intervention Not Indicated -- -- --  Depression Interventions/Treatment  -- -- -- -- -- Currently on Treatment  Financial Strain Interventions -- -- -- Intervention Not Indicated Intervention Not Indicated --  Physical Activity Interventions -- -- -- Intervention Not Indicated  -- --  Stress Interventions -- -- -- Intervention Not Indicated -- --  Social Connections Interventions -- -- -- Intervention Not Indicated -- --      SDOH Screenings   Food Insecurity: No Food Insecurity (11/23/2022)  Housing: Low Risk  (11/23/2022)  Transportation Needs: No Transportation Needs (11/20/2022)  Utilities: Not At Risk (11/23/2022)  Alcohol Screen: Low Risk  (12/25/2022)  Depression (PHQ2-9): Low Risk  (12/25/2022)  Financial Resource Strain: Low Risk  (03/28/2022)  Physical Activity: Insufficiently Active (03/28/2022)  Social Connections: Moderately Integrated (09/20/2022)  Stress: No Stress Concern Present (03/28/2022)  Tobacco Use: Low Risk  (12/25/2022)    Medication Assistance: {MEDASSISTANCEINFO:25044}  Medication Access: Within the past 30 days, how often has patient missed a dose of medication? *** Is a pillbox or other method used to improve adherence? {YES/NO:21197} Factors that may affect medication adherence? {CHL DESC; BARRIERS:21522} Are meds synced by current pharmacy? {YES/NO:21197} Are meds delivered by current pharmacy? {YES/NO:21197} Does patient experience delays in picking up medications due to transportation concerns? {YES/NO:21197}  [facility] Reviewed: Is patient disadvantaged to use  [facility]?: {YES/NO:21197} Current Rx insurance plan: *** Name and location of Current pharmacy:  [facility] - [place], [place] - [address] [place] [address] Phone: [phone] Fax: [phone]  [id number] [address] [place]: [phone]: [phone] [place] [address] [place]Phone: [phone]: [phone] [facility] services reviewed with patient today?: {YES/NO:21197} Patient requests to transfer care to [facility]?:  {YES/NO:21197} Reason patient declined to change pharmacies: {US patient preference:27474}  Compliance/Adherence/Medication fill history: Care Gaps: ***  Star-Rating Drugs: ***   Assessment/Plan  Hypertension (BP goal <140/90) -Controlled -Current treatment:   Metoprolol XL 25 mg daily  -Medications previously tried: HCTZ (Hypotension)  -Current home readings:  -Reports mild headache.  -Current dietary habits: Denies any recent changes in sodium of caffeine intake. Does report 3 glasses of tea and a few sips of Pepsi daily, which is her baseline.  -Current exercise habits: Working in garden.  -Continue current medications   Hyperlipidemia: (LDL goal < 70) -History of MI 1999 -History of TIA Sep 2022 -Uncontrolled -Current treatment: Atorvastatin 40 mg daily -Medications previously tried: NA  -Continue current medications   Diabetes (A1c goal <7%) -Controlled -Current medications: None -Medications previously tried: NA  -Current home glucose readings fasting glucose:   -Denies hypoglycemic/hyperglycemic symptoms -She has been out of atorvastatin and metformin for sometime. -Continue current medications  Dementia (Goal: Prevent further decline in memory) -Managed by Dr. SManuella Ghazi-Uncontrolled -Current treatment  Rivastigmine 3 mg twice daily  -Medications previously tried: NA -Continue current medications  Seizures (Goal: Prevent seizures) -Uncontrolled -Managed by Dr. SManuella Ghazi-Current treatment  Levetiracetam 500 mg twice daily  -Medications previously tried: NA -Recommended to continue current medication  ***

## 2023-01-01 NOTE — Telephone Encounter (Signed)
Care Management & Coordination Services Outreach Note  01/01/2023 Name: Julie Acevedo MRN: [id number] DOB: [birth year]  Referred by: Pcp, No  Patient had a phone appointment scheduled with clinical pharmacist today.  An unsuccessful telephone outreach was attempted today. The patient was referred to the pharmacist for assistance with medications, care management and care coordination.   Patient will NOT be penalized in any way for missing a Care Management & Coordination Services appointment. The no-show fee does not apply.  [name], PharmD, Para [place], CPP  Clinical Pharmacist Practitioner  [facility] [phone]

## 2023-01-02 ENCOUNTER — Encounter: Payer: Self-pay | Admitting: Oncology

## 2023-01-02 LAB — BASIC METABOLIC PANEL

## 2023-01-02 LAB — MICROALBUMIN / CREATININE URINE RATIO

## 2023-01-02 LAB — HEMOGLOBIN A1C

## 2023-01-07 ENCOUNTER — Ambulatory Visit (INDEPENDENT_AMBULATORY_CARE_PROVIDER_SITE_OTHER): Payer: 59

## 2023-01-07 ENCOUNTER — Telehealth: Payer: Self-pay

## 2023-01-07 ENCOUNTER — Ambulatory Visit (INDEPENDENT_AMBULATORY_CARE_PROVIDER_SITE_OTHER): Payer: 59 | Admitting: Podiatry

## 2023-01-07 DIAGNOSIS — S9032XA Contusion of left foot, initial encounter: Secondary | ICD-10-CM

## 2023-01-07 DIAGNOSIS — M216X2 Other acquired deformities of left foot: Secondary | ICD-10-CM | POA: Diagnosis not present

## 2023-01-07 NOTE — Progress Notes (Signed)
Care Management & Coordination Services Pharmacy Assistant    Name: Julie Acevedo  MRN: [id number] DOB: 1951-12-02  Reason for Encounter: Medication Coordination and Delivery for [facility]  Contacted patient on 01/07/2023 to discuss medications   Recent office visits:  12/25/2022 Mardene Speak, PA-C (PCP Office Visit) for Hospital Follow-up- No medication changes noted, Lab orders placed, No follow-up noted  Recent consult visits:  12/19/2022 Hemang Leeanne Deed, MD (Neurology) for Seizures- No medication changes noted, No follow-up noted  Hospital visits:  Medication Reconciliation was completed by comparing discharge summary, patient's EMR and Pharmacy list, and upon discussion with patient.  Admitted to the hospital on 12/26/2022 due to Foot Injury. Discharge date was 12/26/2022. Discharged from [facility] Emergency Department at [place]?Medications Started at [facility] Discharge:?? -Started  None ID  Medication Changes at Hospital Discharge: -Changed  None ID  Medications Discontinued at Hospital Discharge: -Stopped  None ID  Medications that remain the same after Hospital Discharge:??  -All other medications will remain the same.    Medications: Outpatient Encounter Medications as of 01/07/2023  Medication Sig Note   ACCU-CHEK GUIDE test strip USE TO CHECK BLOOD SUGAR UP TO 4 TIMES A DAY AS DIRECTED    Accu-Chek Softclix Lancets lancets SMARTSIG:Topical 1-4 Times Daily    blood glucose meter kit and supplies Dispense Accu-Chek Guide. Use up to four times daily as directed. 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    calcitRIOL (ROCALTROL) 0.25 MCG capsule Take 0.25 mcg by mouth daily. 11/19/2022: Pt spouse brought in this medication.   gabapentin (NEURONTIN) 300 MG capsule Take 1 capsule (300 mg total) by mouth 3 (three) times daily. 11/19/2022: Pt spouse brought in this medication.   Glucagon (GVOKE HYPOPEN 2-PACK) 1 MG/0.2ML SOAJ Inject 0.2 mLs into  the skin as needed. 11/19/2022: Recent Dispenses     08/21/2022 1 MG/0.2ML SOAJ (disp .4, 2d supply)     hydrOXYzine (VISTARIL) 100 MG capsule Take 1 capsule (100 mg total) by mouth 3 (three) times daily. 11/19/2022: Pt spouse brought in this medication.   levETIRAcetam (KEPPRA) 500 MG tablet Take 1 tablet (500 mg total) by mouth 2 (two) times daily.    linaclotide (LINZESS) 290 MCG CAPS capsule TAKE 1 CAPSULE BY MOUTH EVERY DAY BEFORE BREAKFAST (Patient taking differently: Take 290 mcg by mouth daily before breakfast. TAKE 1 CAPSULE BY MOUTH EVERY DAY BEFORE BREAKFAST) 11/19/2022: Pt spouse brought in this medication.   metFORMIN (GLUCOPHAGE) 500 MG tablet Take by mouth 2 (two) times daily with a meal. 11/19/2022: Pt spouse brought in this medication.   metoprolol succinate (TOPROL-XL) 25 MG 24 hr tablet TAKE 1 TABLET (25 MG TOTAL) BY MOUTH DAILY. 11/19/2022: Pt and Pt's spouse did not know if Pt had medication today.  Pt's spouse claim Pt had medications last night.  Pt's spouse brought in this medication.  Recent Dispenses     09/05/2022 25 MG TB24 (disp 90, 90d supply)   mirtazapine (REMERON) 30 MG tablet TAKE ONE TABLET BY MOUTH EVERYDAY AT BEDTIME 11/19/2022: Recent Dispenses     11/13/2022 30 MG TABS (disp 30, 30d supply)     omeprazole (PRILOSEC) 20 MG capsule Take 1 capsule (20 mg total) by mouth daily. 11/19/2022: Pt spouse brought in this medication.   ondansetron (ZOFRAN) 4 MG tablet TAKE 1 TABLET BY MOUTH EVERY 8 HOURS AS NEEDED (Patient taking differently: Take 4 mg by mouth every 8 (eight) hours as needed for nausea or vomiting.)  polyethylene glycol powder (MIRALAX) 17 GM/SCOOP powder Take 1 scoop or 17 g 3 times a day for 2 days then use 1 scoop twice a day a day until you begin having soft bowel movements then cut back to 1 scoop a day.    rivastigmine (EXELON) 3 MG capsule Take 3 mg by mouth 2 (two) times daily. 11/19/2022: Pt spouse brought in this medication.   rosuvastatin  (CRESTOR) 40 MG tablet Take 1 tablet (40 mg total) by mouth daily.    traMADol (ULTRAM) 50 MG tablet Take 1 tablet (50 mg total) by mouth every 6 (six) hours as needed for moderate pain or severe pain. 11/19/2022:  Recent Dispenses      10/24/2022 50 MG TABS (disp 100, 25d supply)      triamcinolone cream (KENALOG) 0.5 % Apply 1 Application topically 3 (three) times daily as needed (flares).    No facility-administered encounter medications on file as of 01/07/2023.   BP Readings from Last 3 Encounters:  12/26/22 130/78  12/25/22 121/69  11/21/22 124/61    Pulse Readings from Last 3 Encounters:  12/26/22 80  12/25/22 72  11/21/22 66    Lab Results  Component Value Date/Time   HGBA1C CANCELED 12/25/2022 11:08 AM   HGBA1C 5.4 08/09/2022 06:40 PM   HGBA1C 6.3 01/18/2014 06:32 AM   Lab Results  Component Value Date   CREATININE 1.46 (H) 11/20/2022   BUN 11 11/20/2022   GFRNONAA 39 (L) 11/20/2022   GFRAA 42 (L) 12/05/2020   NA 141 11/20/2022   K 3.8 11/20/2022   CALCIUM 8.8 (L) 11/20/2022   CO2 23 11/20/2022   Last adherence delivery date: 12/19/2022      Patient is due for next adherence delivery on: 01/17/2023 1st Route  Spoke with patient on 01/07/2023 and reviewed medications. Patient denied the need for any medications at this time.  This delivery to include: Vials  30 Days  Mirtazapine 30 mg 1 tablet daily- (Bedtime) Gabapentin 300 mg 1 capsule three times daily-(Breakfast, Lunch, Bedtime) Hydroxyzine 100 mg 1 capsule three times daily-(Breakfast, Lunch, Bedtime) Linzess 290 mcg caps 1 cap daily (Before Breakfast) Rivastigmine 3 mg twice daily- (Breakfast, Bedtime) Omeprazole 20 mg  1 capsule by mouth daily -(Breakfast) Metoprolol ER 25 mg 1 tablet daily- (Bedtime) Rosuvastatin 40 mg 1 tablet daily- (Breakfast)  Patient declined the following medications this month: No medications were declined for the month of February  Refills requested from providers  include:  Confirmed delivery date of 01/17/2023 1st Route, advised patient that pharmacy will contact them the morning of delivery.  Any concerns about your medications? No  How often do you forget or accidentally miss a dose? Never  Do you use a pillbox? No  Is patient in packaging No  Recent blood pressure readings are as follows: 130/78  Cycle dispensing form sent to [name], CPP for review.  I spoke with the patient, and she reports that her L foot is still in pain where she had a spaghetti jar fall from her cabinet and land on her foot. She stated when she went to the ER they said it was no fracture, but she is having a hard time walking on the foot, she is in pain, and her foot is still swollen.  Per patient she has an appointment today to follow-up and is hoping to have another X-Ray to double check to make sure there is no fracture. Patient instructed to call me if she needs anything.  Patient  has a follow-up with CPP on 01/21/2023 @ 1400.  Lynann Bologna, CPA/CMA Clinical Pharmacist Assistant Phone: [phone]

## 2023-01-07 NOTE — Patient Instructions (Signed)
Look for Voltaren gel at the pharmacy over the counter or online (also known as diclofenac 1% gel). Apply to the painful areas 3-4x daily with the supplied dosing card. Allow to dry for 10 minutes before going into socks/shoes  

## 2023-01-07 NOTE — Telephone Encounter (Signed)
     Patient  visit on 1/10  at [place]   Have you been able to follow up with your primary care physician? Yes   The patient was or was not able to obtain any needed medicine or equipment. Yes   Are there diet recommendations that you are having difficulty following? Na   Patient expresses understanding of discharge instructions and education provided has no other needs at this time.  Yes       [place], [facility], Care Management  [phone] [address], [place], [place] [address] Phone: [phone] Email: [email or web address]

## 2023-01-11 ENCOUNTER — Other Ambulatory Visit: Payer: Self-pay | Admitting: Family Medicine

## 2023-01-12 NOTE — Progress Notes (Signed)
  Subjective:  Patient ID: Julie Acevedo, female    DOB: [birth year],  MRN: [id number]  Chief Complaint  Patient presents with   Foot Injury    left foot injury-painful    72 y.o. female presents with the above complaint. History confirmed with patient. She stubbed the foot was worried she injured her surgical site.  Has had pain and swelling over the top of the forefoot  Objective:  Physical Exam: warm, good capillary refill, no trophic changes or ulcerative lesions, normal DP and PT pulses, normal sensory exam, and slight tenderness and edema over the second and third MTPJ  Radiographs: Multiple views x-ray of the left foot: no fracture, dislocation, swelling or degenerative changes noted, no complication of surgical fixation Assessment:   1. Contusion of left foot, initial encounter      Plan:  Patient was evaluated and treated and all questions answered.  I reviewed the x-rays with the patient likely has a contusion.  RICE protocol reviewed.  OTC Tylenol for pain control.  Discussed she may return to the surgical shoe she has used previously if necessary if regular shoes are not tolerable  Return if symptoms worsen or fail to improve.

## 2023-01-14 ENCOUNTER — Ambulatory Visit: Payer: Self-pay | Admitting: *Deleted

## 2023-01-14 ENCOUNTER — Telehealth: Payer: Self-pay | Admitting: Physician Assistant

## 2023-01-14 NOTE — Telephone Encounter (Signed)
Handicap parking sticker form is being sent back for completion. Let patient know when it is done.

## 2023-01-14 NOTE — Patient Outreach (Signed)
  Care Coordination   Follow Up Visit Note   01/14/2023 Name: Julie Acevedo MRN: [id number] DOB: Mar 21, 1951  Julie Acevedo is a 72 y.o. year old female who sees Pcp, No for primary care. I spoke with  Julie Acevedo by phone today.  What matters to the patients health and wellness today?  Patient report she is doing well, no further seizures, daughter is providing transportation for errands and MD appointments as she is not to drive for 6 months.  Foot remains sore from where she dropped a jar on it, but it is not broken, was told by provider it would take time to heal as it was a bruised bone.   Denies any urgent concerns, encouraged to contact this care manager with questions.     Goals Addressed             This Visit's Progress    COMPLETED: Post hospital recovery for seizure   On track    Care Coordination Interventions: Evaluation of current treatment plan related to seizure and patient's adherence to plan as established by provider Advised patient to re-consider MD recommendations for PT/OT - declined to have servies Reviewed scheduled/upcoming provider appointments including PCP follow up on 2/29 Noted referral for [facility] for HHPT, OT, and SLP, patient state she does not wish to have services.  Educated on benefits of services, encouraged to consider again Denies fall or seizure activity since discharge       COMPLETED: RNCM: Effective Management of Hypoglycemia       Care Coordination Interventions: Provided education to patient about basic DM disease process. The patient has issues with low blood sugars. The patient states she is doing better and eating better. Admits she does not monitor daily and will not monitor daily, only when she feels it is low. Reviewed medications with patient and discussed importance of medication adherence.  Counseled on importance of regular laboratory monitoring as prescribed Discussed plans with patient for ongoing care management follow up and  provided patient with direct contact information for care management team Provided patient with written educational materials related to hypo and hyperglycemia and importance of correct treatment. State she will eat food or candy if she has hypoglycemic event Review of heart healthy/ADA diet. Recommendations for eating smaller more frequent meals and making sure to have snacks on hand when she is going out. Education provided Assisted the patient in resetting her password for myChart. Education and support provided.  Reviewed scheduled/upcoming provider appointments including: This afternoon with PCP.  Discussed transportation as chart states patient should not drive for 6 months post seizure.  Initially stated someone would ride with her, then she stated she would have her daughter take her. Advised patient, providing education and rationale, to check cbg as directed (has a continuous reader) and record, calling pcp for findings outside established parameters The patient agrees to work with the [facility]. The patient provided the [facility] number to call for changes or needs before next outreach. Denies any immediate concerns at this time.            SDOH assessments and interventions completed:  No     Care Coordination Interventions:  Yes, provided   Follow up plan: No further intervention required.   Encounter Outcome:  Pt. Visit Completed   Julie David, RN, MSN, [place] [facility] Care Management Coordinator [phone]

## 2023-01-16 NOTE — Telephone Encounter (Signed)
Notified pt form is complete/sign --ready to be pick-up at the front office. Copy sent to scan

## 2023-01-16 NOTE — Telephone Encounter (Signed)
Do you have this form?

## 2023-01-17 ENCOUNTER — Other Ambulatory Visit: Payer: Self-pay | Admitting: Ophthalmology

## 2023-01-17 DIAGNOSIS — H5789 Other specified disorders of eye and adnexa: Secondary | ICD-10-CM

## 2023-01-17 DIAGNOSIS — H189 Unspecified disorder of cornea: Secondary | ICD-10-CM | POA: Diagnosis not present

## 2023-01-17 DIAGNOSIS — H532 Diplopia: Secondary | ICD-10-CM | POA: Diagnosis not present

## 2023-01-17 DIAGNOSIS — H0520 Unspecified exophthalmos: Secondary | ICD-10-CM | POA: Diagnosis not present

## 2023-01-18 ENCOUNTER — Telehealth: Payer: Self-pay | Admitting: Physician Assistant

## 2023-01-18 ENCOUNTER — Other Ambulatory Visit
Admission: RE | Admit: 2023-01-18 | Discharge: 2023-01-18 | Disposition: A | Discharge status: Home / Self Care | Payer: 59 | Source: Ambulatory Visit | Attending: Ophthalmology | Admitting: Ophthalmology

## 2023-01-18 DIAGNOSIS — H0520 Unspecified exophthalmos: Secondary | ICD-10-CM | POA: Insufficient documentation

## 2023-01-18 LAB — TSH: TSH: 2.483 u[IU]/mL (ref 0.350–4.500)

## 2023-01-18 NOTE — Telephone Encounter (Signed)
Julie Acevedo from [place] [facility] called in asking if fax has been received for glucose monitor for patient.

## 2023-01-19 LAB — THYROID STIMULATING IMMUNOGLOBULIN: Thyroid Stimulating Immunoglob: <0.1 IU/L (ref 0.00–0.55)

## 2023-01-19 LAB — THYROID PEROXIDASE ANTIBODY: Thyroperoxidase Ab SerPl-aCnc: <9 IU/mL (ref 0–34)

## 2023-01-21 ENCOUNTER — Encounter: Payer: Self-pay | Admitting: Oncology

## 2023-01-21 ENCOUNTER — Ambulatory Visit: Payer: Self-pay

## 2023-01-21 NOTE — Telephone Encounter (Signed)
Chief Complaint: Leg swelling left leg Symptoms: Pain 5/10 behind knee, concerned for possible blood clot Frequency: Onset 1 week ago Pertinent Negatives: Patient denies other symptoms Disposition: '[]'$ ED /'[]'$ Urgent Care (no appt availability in office) / '[x]'$ Appointment(In office/virtual)/ '[]'$  [place] [facility]/ '[]'$ Home Care/ '[]'$ Refused Recommended Disposition /'[]'$ [place] [facility]/ '[]'$  Follow-up with PCP Additional Notes: Advised will need to establish with Dr. Quentin Cornwall, appt scheduled.   Reason for Disposition  [1] MODERATE leg swelling (e.g., swelling extends up to knees) AND [2] new-onset or worsening  Answer Assessment - Initial Assessment Questions 1. ONSET: "When did the swelling start?" (e.g., minutes, hours, days)     1 week ago 2. LOCATION: "What part of the leg is swollen?"  "Are both legs swollen or just one leg?"     Left leg 3. SEVERITY: "How bad is the swelling?" (e.g., localized; mild, moderate, severe)   - Localized: Small area of swelling localized to one leg.   - MILD pedal edema: Swelling limited to foot and ankle, pitting edema < 1/4 inch (6 mm) deep, rest and elevation eliminate most or all swelling.   - MODERATE edema: Swelling of lower leg to knee, pitting edema > 1/4 inch (6 mm) deep, rest and elevation only partially reduce swelling.   - SEVERE edema: Swelling extends above knee, facial or hand swelling present.      Moderate 4. REDNESS: "Does the swelling look red or infected?"     No 5. PAIN: "Is the swelling painful to touch?" If Yes, ask: "How painful is it?"   (Scale 1-10; mild, moderate or severe)     5 to the back of the knee 6. FEVER: "Do you have a fever?" If Yes, ask: "What is it, how was it measured, and when did it start?"      No 7. CAUSE: "What do you think is causing the leg swelling?"     Dropped a jar of spaghetti sauce on foot 1.5 mos ago 8. 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 RECURRENT SYMPTOM: "Have you had leg swelling before?" If Yes, ask: "When was the last time?" "What happened that time?"     Somewhat, not significant 10. OTHER SYMPTOMS: "Do you have any other symptoms?" (e.g., chest pain, difficulty breathing)       No  Protocols used: Leg Swelling and Edema-A-AH

## 2023-01-21 NOTE — Progress Notes (Unsigned)
New patient visit   Patient: Julie Acevedo   DOB: January 17, 1951   72 y.o. Female  MRN: [id number] Visit Date: 01/22/2023  Today's healthcare provider: Eulis Foster, MD   No chief complaint on file.  Subjective    LADY Julie Acevedo is a 72 y.o. female who presents today as a new patient to establish care.  HPI  ***  Past Medical History:  Diagnosis Date   Anemia    Arthritis    COVID-19 11/19/2019   Diverticulitis    DM (diabetes mellitus) ([place])    typ e 2   History of methicillin resistant staphylococcus aureus (MRSA) 2017   HTN (hypertension)    Hyperlipidemia    Memory difficulties 09/01/2015   Multiple thyroid nodules    Myocardial infarction ([place]) 1995   Short-term memory loss    Thyroid nodule    Past Surgical History:  Procedure Laterality Date   ABDOMINAL HYSTERECTOMY     due to endometriosis-1 ovary left   BREAST EXCISIONAL BIOPSY Right 1971   neg   CARDIAC CATHETERIZATION  2000   no significant CAD per note of Julie Acevedo Comes in 2013    [place] N/A 03/19/2017   Procedure: ANTERIOR REPAIR (CYSTOCELE);  Surgeon: Bjorn Loser, MD;  Location: [facility];  Service: Urology;  Laterality: N/A;   CYSTOSCOPY N/A 03/19/2017   Procedure: CYSTOSCOPY;  Surgeon: Bjorn Loser, MD;  Location: [facility];  Service: Urology;  Laterality: N/A;   ESOPHAGOGASTRODUODENOSCOPY (EGD) WITH PROPOFOL N/A 12/15/2021   Procedure: ESOPHAGOGASTRODUODENOSCOPY (EGD) WITH PROPOFOL;  Surgeon: Lesly Rubenstein, MD;  Location: [facility];  Service: Endoscopy;  Laterality: N/A;   EYE SURGERY     FOOT SURGERY     HAND SURGERY     HEMICOLECTOMY     INSERTION OF MESH N/A 08/21/2016   Procedure: INSERTION OF MESH;  Surgeon: Excell Seltzer, MD;  Location: [facility];  Service: General;  Laterality: N/A;   INSERTION OF MESH N/A 12/08/2019   Procedure: INSERTION OF MESH;  Surgeon: Jules Husbands, MD;  Location: [facility];  Service: General;  Laterality: N/A;    LAPAROSCOPIC LYSIS OF ADHESIONS N/A 08/21/2016   Procedure: LAPAROSCOPIC LYSIS OF ADHESIONS;  Surgeon: Excell Seltzer, MD;  Location: [facility];  Service: General;  Laterality: N/A;   LAPAROSCOPIC REMOVAL ABDOMINAL MASS     LAPAROTOMY N/A 04/16/2019   Procedure: EXPLORATORY LAPAROTOMY;  Surgeon: Benjamine Sprague, DO;  Location: [facility];  Service: General;  Laterality: N/A;   LOOP RECORDER INSERTION N/A 03/21/2018   Procedure: LOOP RECORDER INSERTION;  Surgeon: Sanda Klein, MD;  Location: [facility];  Service: Cardiovascular;  Laterality: N/A;   LUMBAR DISC SURGERY     ROUX-EN-Y GASTRIC BYPASS  2010   Julie Hassell Done   SMALL INTESTINE SURGERY     TONSILLECTOMY     UPPER GI ENDOSCOPY  01/12/2016   normal larynx, normal esophagus. gastric bypass with a normal-sized pouch and intact staple line, normal examined jejunum, otherwise exam was normal   VENTRAL HERNIA REPAIR N/A 08/21/2016   Procedure: LAPAROSCOPIC VENTRAL HERNIA;  Surgeon: Excell Seltzer, MD;  Location: [facility];  Service: General;  Laterality: N/A;   XI ROBOTIC ASSISTED VENTRAL HERNIA N/A 12/08/2019   Procedure: XI ROBOTIC ASSISTED VENTRAL HERNIA;  Surgeon: Jules Husbands, MD;  Location: [facility];  Service: General;  Laterality: N/A;   Family Status  Relation Name Status   Father  Deceased at age 42  Mother  Deceased at age 74   Julie Acevedo  Deceased   MGM  Deceased   MGF  Deceased at age 14   PGF  Deceased       in his 26s MI   Julie Acevedo 67  (Not Specified)   Julie Acevedo  (Not Specified)   Family History  Problem Relation Age of Onset   Cancer Father 93       Lung   Coronary artery disease Father 29   COPD Mother    Diabetes Mother    Hypertension Mother    Cancer Paternal Grandmother        Breast   Breast cancer Paternal Grandmother    Congestive Heart Failure Maternal Grandmother    Emphysema Maternal Grandfather    Heart attack Paternal Grandfather    Cancer Paternal Aunt        Breast   Breast cancer Paternal Aunt  100   Breast cancer Paternal Aunt 72   Social History   Socioeconomic History   Marital status: Married    Spouse name: Not on file   Number of children: 3   Years of education: 14   Highest education level: Some college, no degree  Occupational History   Occupation: IT consultant: RETIRED  Tobacco Use   Smoking status: Never   Smokeless tobacco: Never  Vaping Use   Vaping Use: Never used  Substance and Sexual Activity   Alcohol use: No   Drug use: No   Sexual activity: Never  Other Topics Concern   Not on file  Social History Narrative   Lives with husband, daughter and granddaughter.;   Patient drinks about 3 cups of caffeine daily.   Patient is left handed.    Social Determinants of Health   Financial Resource Strain: Low Risk  (03/28/2022)   Overall Financial Resource Strain (CARDIA)    Difficulty of Paying Living Expenses: Not hard at all  Food Insecurity: No Food Insecurity (11/23/2022)   Hunger Vital Sign    Worried About Running Out of Food in the Last Year: Never true    Ran Out of Food in the Last Year: Never true  Transportation Needs: No Transportation Needs (11/20/2022)   PRAPARE - Hydrologist (Medical): No    Lack of Transportation (Non-Medical): No  Physical Activity: Insufficiently Active (03/28/2022)   Exercise Vital Sign    Days of Exercise per Week: 2 days    Minutes of Exercise per Session: 20 min  Stress: No Stress Concern Present (03/28/2022)   [place]    Feeling of Stress : Not at all  Social Connections: Moderately Integrated (09/20/2022)   Social Connection and Isolation Panel [NHANES]    Frequency of Communication with Friends and Family: More than three times a week    Frequency of Social Gatherings with Friends and Family: Once a week    Attends Religious Services: 1 to 4 times per year    Active Member of [employer] or Organizations: No     Attends Music therapist: Never    Marital Status: Married   Outpatient Medications Prior to Visit  Medication Sig   ACCU-CHEK GUIDE test strip USE TO CHECK BLOOD SUGAR UP TO 4 TIMES A DAY AS DIRECTED   Accu-Chek Softclix Lancets lancets SMARTSIG:Topical 1-4 Times Daily   blood glucose meter kit and supplies Dispense Accu-Chek Guide. Use up to four times daily  as directed. 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   calcitRIOL (ROCALTROL) 0.25 MCG capsule Take 0.25 mcg by mouth daily.   gabapentin (NEURONTIN) 300 MG capsule Take 1 capsule (300 mg total) by mouth 3 (three) times daily.   Glucagon (GVOKE HYPOPEN 2-PACK) 1 MG/0.2ML SOAJ Inject 0.2 mLs into the skin as needed.   hydrOXYzine (VISTARIL) 100 MG capsule Take 1 capsule (100 mg total) by mouth 3 (three) times daily.   levETIRAcetam (KEPPRA) 500 MG tablet Take 1 tablet (500 mg total) by mouth 2 (two) times daily.   linaclotide (LINZESS) 290 MCG CAPS capsule TAKE 1 CAPSULE BY MOUTH EVERY DAY BEFORE BREAKFAST (Patient taking differently: Take 290 mcg by mouth daily before breakfast. TAKE 1 CAPSULE BY MOUTH EVERY DAY BEFORE BREAKFAST)   metFORMIN (GLUCOPHAGE) 500 MG tablet Take by mouth 2 (two) times daily with a meal.   metoprolol succinate (TOPROL-XL) 25 MG 24 hr tablet TAKE 1 TABLET (25 MG TOTAL) BY MOUTH DAILY.   mirtazapine (REMERON) 30 MG tablet TAKE ONE TABLET BY MOUTH EVERYDAY AT BEDTIME   omeprazole (PRILOSEC) 20 MG capsule Take 1 capsule (20 mg total) by mouth daily.   ondansetron (ZOFRAN) 4 MG tablet TAKE 1 TABLET BY MOUTH EVERY 8 HOURS AS NEEDED (Patient taking differently: Take 4 mg by mouth every 8 (eight) hours as needed for nausea or vomiting.)   polyethylene glycol powder (MIRALAX) 17 GM/SCOOP powder Take 1 scoop or 17 g 3 times a day for 2 days then use 1 scoop twice a day a day until you begin having soft bowel movements then cut back to 1 scoop a day.   rivastigmine (EXELON) 3 MG capsule Take 3 mg by mouth 2 (two) times  daily.   rosuvastatin (CRESTOR) 40 MG tablet Take 1 tablet (40 mg total) by mouth daily.   traMADol (ULTRAM) 50 MG tablet Take 1 tablet (50 mg total) by mouth every 6 (six) hours as needed for moderate pain or severe pain.   triamcinolone cream (KENALOG) 0.5 % Apply 1 Application topically 3 (three) times daily as needed (flares).   No facility-administered medications prior to visit.   Allergies  Allergen Reactions   Lotrel [Amlodipine Besy-Benazepril Hcl] Anaphylaxis   Contrast Media [Iodinated Contrast Media] Swelling and Other (See Comments)    Causes neck swelling    Niacin Hives   Sulfa Antibiotics Hives    Immunization History  Administered Date(s) Administered   Fluad Quad(high Dose 65+) 08/15/2019, 09/01/2020, 09/13/2021, 11/15/2022   Influenza Split 10/17/2010, 10/02/2011, 08/27/2012   Influenza, High Dose Seasonal PF 08/29/2018   Influenza,inj,Quad PF,6+ Mos 09/26/2013, 09/06/2014, 09/06/2016   Influenza,inj,Quad PF,6-35 Mos 08/31/2017   Influenza-Unspecified 09/15/2015   PFIZER Comirnaty(Gray Top)Covid-19 Tri-Sucrose Vaccine 04/05/2021, 11/15/2022   PFIZER(Purple Top)SARS-COV-2 Vaccination 01/29/2020, 02/23/2020, 10/11/2020   PNEUMOCOCCAL CONJUGATE-20 06/24/2021   Pneumococcal Conjugate-13 08/29/2018   Pneumococcal Polysaccharide-23 11/17/2012   Tdap 10/02/2011, 09/27/2016   Zoster Recombinat (Shingrix) 08/29/2018, 08/15/2019   Zoster, Live 09/06/2016    Health Maintenance  Topic Date Due   OPHTHALMOLOGY EXAM  06/13/2021   FOOT EXAM  01/19/2022   COVID-19 Vaccine (6 - 2023-24 season) 01/10/2023   Medicare Annual Wellness (AWV)  03/29/2023   HEMOGLOBIN A1C  06/25/2023   Diabetic kidney evaluation - eGFR measurement  12/26/2023   Diabetic kidney evaluation - Urine ACR  12/26/2023   MAMMOGRAM  12/21/2024   DEXA SCAN  03/10/2025   DTaP/Tdap/Td (3 - Td or Tdap) 09/27/2026   COLONOSCOPY (Pts 45-45yr Insurance coverage will need  to be confirmed)  09/05/2028    Pneumonia Vaccine 53+ Years old  Completed   INFLUENZA VACCINE  Completed   Hepatitis C Screening  Completed   Zoster Vaccines- Shingrix  Completed   HPV VACCINES  Aged Out    Patient Care Team: Pcp, No as PCP - General [place], Loran Senters, MD as PCP - Cardiology (Cardiology) Lorelee Cover., MD as Consulting Physician (Ophthalmology) Bjorn Loser, MD as Consulting Physician (Urology) Byrnett, Forest Gleason, MD as Consulting Physician (General Surgery) Jules Husbands, MD as Consulting Physician (General Surgery) Gardiner Barefoot, DPM as Consulting Physician (Podiatry) Gae Dry, MD as Referring Physician (Obstetrics and Gynecology) Vladimir Crofts, MD as Consulting Physician (Neurology) Randon Goldsmith, OT as Occupational Therapist (Occupational Therapy) Lloyd Huger, MD as Consulting Physician (Oncology) Vanita Ingles, RN as Case Manager (General Practice)  Review of Systems  {Labs  Heme  Chem  Endocrine  Serology  Results Review (optional):23779}   Objective    There were no vitals taken for this visit. {Show previous vital signs (optional):23777}  Physical Exam ***  Depression Screen    12/25/2022   10:23 AM 09/20/2022    3:37 PM 04/24/2022    1:17 PM 03/28/2022    8:30 AM  PHQ 2/9 Scores  PHQ - 2 Score 0 0 0 0  PHQ- 9 Score 0      No results found for any visits on 01/22/23.  Assessment & Plan      Problem List Items Addressed This Visit   None    No follow-ups on file.      The entirety of the information documented in the History of Present Illness, Review of Systems and Physical Exam were personally obtained by me. Portions of this information were initially documented by *** and reviewed by me for thoroughness and accuracy.Eulis Foster, MD     Eulis Foster, MD  [facility] [phone] (phone) [phone] (fax)  [place]

## 2023-01-21 NOTE — Telephone Encounter (Signed)
Ok for [facility] to advise no fax received and if they could sent another one Fax # [phone] Attn: Mardene Speak, PA-C

## 2023-01-22 ENCOUNTER — Ambulatory Visit (INDEPENDENT_AMBULATORY_CARE_PROVIDER_SITE_OTHER): Payer: 59 | Admitting: Family Medicine

## 2023-01-22 ENCOUNTER — Ambulatory Visit
Admission: RE | Admit: 2023-01-22 | Discharge: 2023-01-22 | Disposition: A | Discharge status: Home / Self Care | Payer: 59 | Source: Ambulatory Visit | Attending: Family Medicine | Admitting: Family Medicine

## 2023-01-22 ENCOUNTER — Encounter: Payer: Self-pay | Admitting: Family Medicine

## 2023-01-22 VITALS — BP 130/82 | HR 65 | Temp 98.0°F | Resp 16 | Ht 63.0 in | Wt 131.0 lb

## 2023-01-22 DIAGNOSIS — M7989 Other specified soft tissue disorders: Secondary | ICD-10-CM

## 2023-01-22 HISTORY — DX: Other specified soft tissue disorders: M79.89

## 2023-01-22 NOTE — Assessment & Plan Note (Signed)
New problem that has been progressively become more painful  With hx of PE,will order Stat DVT US of LLE to assess for Dvt  No systemic symptoms at this time, VS reviewed and within normal range

## 2023-01-22 NOTE — Patient Instructions (Addendum)
Today we will order an ultrasound of your left leg to assess for a blood clot  Please go to the [facility] for your scan located at  [place]   We will contact you regarding results once they are available and discuss next steps at that time   Please follow up with me in 2 weeks   Dr. Quentin Cornwall

## 2023-01-24 ENCOUNTER — Ambulatory Visit
Admission: RE | Admit: 2023-01-24 | Discharge: 2023-01-24 | Disposition: A | Discharge status: Home / Self Care | Payer: 59 | Source: Ambulatory Visit | Attending: Ophthalmology | Admitting: Ophthalmology

## 2023-01-24 DIAGNOSIS — E079 Disorder of thyroid, unspecified: Secondary | ICD-10-CM | POA: Diagnosis not present

## 2023-01-24 DIAGNOSIS — E0500 Thyrotoxicosis with diffuse goiter without thyrotoxic crisis or storm: Secondary | ICD-10-CM | POA: Diagnosis not present

## 2023-01-24 DIAGNOSIS — H5789 Other specified disorders of eye and adnexa: Secondary | ICD-10-CM | POA: Insufficient documentation

## 2023-02-04 DIAGNOSIS — I10 Essential (primary) hypertension: Secondary | ICD-10-CM | POA: Diagnosis not present

## 2023-02-04 DIAGNOSIS — D631 Anemia in chronic kidney disease: Secondary | ICD-10-CM | POA: Diagnosis not present

## 2023-02-04 DIAGNOSIS — E119 Type 2 diabetes mellitus without complications: Secondary | ICD-10-CM | POA: Diagnosis not present

## 2023-02-04 DIAGNOSIS — I129 Hypertensive chronic kidney disease with stage 1 through stage 4 chronic kidney disease, or unspecified chronic kidney disease: Secondary | ICD-10-CM | POA: Diagnosis not present

## 2023-02-04 DIAGNOSIS — N2581 Secondary hyperparathyroidism of renal origin: Secondary | ICD-10-CM | POA: Diagnosis not present

## 2023-02-05 ENCOUNTER — Telehealth: Payer: Self-pay | Admitting: Physician Assistant

## 2023-02-05 NOTE — Telephone Encounter (Signed)
[facility] faxed refill request for the following medications:   linaclotide (LINZESS) 290 MCG CAPS capsule    Please advise

## 2023-02-06 ENCOUNTER — Other Ambulatory Visit: Payer: Self-pay

## 2023-02-06 NOTE — Telephone Encounter (Signed)
LOV 01/22/23 NOV 02/14/23 LRF 06/25/22 90 x 1

## 2023-02-13 ENCOUNTER — Telehealth: Payer: Self-pay | Admitting: Physician Assistant

## 2023-02-13 NOTE — Telephone Encounter (Signed)
Called patient to schedule Medicare Annual Wellness Visit (AWV). Left message for patient to call back and schedule Medicare Annual Wellness Visit (AWV).  Last date of AWV: 03/28/2022  Please schedule an appointment at any time with [facility].  If any questions, please contact me at [phone]. Thank you ,  [place] Direct Dial: [phone]

## 2023-02-14 ENCOUNTER — Ambulatory Visit: Payer: 59 | Admitting: Physician Assistant

## 2023-02-14 ENCOUNTER — Other Ambulatory Visit: Payer: Self-pay | Admitting: Family Medicine

## 2023-02-14 ENCOUNTER — Encounter: Payer: Self-pay | Admitting: Physician Assistant

## 2023-02-14 ENCOUNTER — Ambulatory Visit (INDEPENDENT_AMBULATORY_CARE_PROVIDER_SITE_OTHER): Payer: 59 | Admitting: Physician Assistant

## 2023-02-14 VITALS — BP 132/66 | HR 67 | Wt 134.6 lb

## 2023-02-14 DIAGNOSIS — I10 Essential (primary) hypertension: Secondary | ICD-10-CM | POA: Diagnosis not present

## 2023-02-14 DIAGNOSIS — N1832 Chronic kidney disease, stage 3b: Secondary | ICD-10-CM | POA: Diagnosis not present

## 2023-02-14 DIAGNOSIS — E1122 Type 2 diabetes mellitus with diabetic chronic kidney disease: Secondary | ICD-10-CM | POA: Diagnosis not present

## 2023-02-14 DIAGNOSIS — M7989 Other specified soft tissue disorders: Secondary | ICD-10-CM | POA: Diagnosis not present

## 2023-02-14 DIAGNOSIS — E782 Mixed hyperlipidemia: Secondary | ICD-10-CM

## 2023-02-14 DIAGNOSIS — E440 Moderate protein-calorie malnutrition: Secondary | ICD-10-CM

## 2023-02-14 NOTE — Progress Notes (Signed)
I,Sha'taria Tyson,acting as a Education administrator for [employer], PA-C.,have documented all relevant documentation on the behalf of Mardene Speak, PA-C,as directed by  [employer], PA-C while in the presence of [employer], PA-C.  Established patient visit   Patient: Julie Acevedo   DOB: [birth year]   72 y.o. Female  MRN: [id number] Visit Date: 02/14/2023  Today's healthcare provider: Mardene Speak, PA-C   CC: left leg edema Fu and chronic med conditions fu  Subjective    HPI  Follow up for Left leg Edema  The patient was last seen for this 3 weeks ago. Changes made at last visit include none. Stat US Venous Img Lower Unilateral Left ordered.  She feels that condition is Improved. Reports that it has subsided -----------------------------------------------------------------------------------------Doing well with HTN, CKD and HLD Takes statin '40mg'$ , metformin '500mg'$  BID and metoprolol 25 mg Adheres to healthy diet and stays active.  Medications: Outpatient Medications Prior to Visit  Medication Sig   ACCU-CHEK GUIDE test strip USE TO CHECK BLOOD SUGAR UP TO 4 TIMES A DAY AS DIRECTED   Accu-Chek Softclix Lancets lancets SMARTSIG:Topical 1-4 Times Daily   blood glucose meter kit and supplies Dispense Accu-Chek Guide. Use up to four times daily as directed. 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   calcitRIOL (ROCALTROL) 0.25 MCG capsule Take 0.25 mcg by mouth daily.   gabapentin (NEURONTIN) 300 MG capsule Take 1 capsule (300 mg total) by mouth 3 (three) times daily.   Glucagon (GVOKE HYPOPEN 2-PACK) 1 MG/0.2ML SOAJ Inject 0.2 mLs into the skin as needed.   hydrOXYzine (VISTARIL) 100 MG capsule Take 1 capsule (100 mg total) by mouth 3 (three) times daily.   levETIRAcetam (KEPPRA) 500 MG tablet Take 1 tablet (500 mg total) by mouth 2 (two) times daily.   metFORMIN (GLUCOPHAGE) 500 MG tablet Take by mouth 2 (two) times daily with a meal.   metoprolol succinate (TOPROL-XL) 25 MG 24 hr tablet TAKE 1  TABLET (25 MG TOTAL) BY MOUTH DAILY.   mirtazapine (REMERON) 30 MG tablet TAKE ONE TABLET BY MOUTH EVERYDAY AT BEDTIME   omeprazole (PRILOSEC) 20 MG capsule Take 1 capsule (20 mg total) by mouth daily.   ondansetron (ZOFRAN) 4 MG tablet TAKE 1 TABLET BY MOUTH EVERY 8 HOURS AS NEEDED (Patient taking differently: Take 4 mg by mouth every 8 (eight) hours as needed for nausea or vomiting.)   polyethylene glycol powder (MIRALAX) 17 GM/SCOOP powder Take 1 scoop or 17 g 3 times a day for 2 days then use 1 scoop twice a day a day until you begin having soft bowel movements then cut back to 1 scoop a day.   rivastigmine (EXELON) 3 MG capsule Take 3 mg by mouth 2 (two) times daily.   rosuvastatin (CRESTOR) 40 MG tablet Take 1 tablet (40 mg total) by mouth daily.   traMADol (ULTRAM) 50 MG tablet Take 1 tablet (50 mg total) by mouth every 6 (six) hours as needed for moderate pain or severe pain.   triamcinolone cream (KENALOG) 0.5 % Apply 1 Application topically 3 (three) times daily as needed (flares).   No facility-administered medications prior to visit.    Review of Systems  All other systems reviewed and are negative. Except see HPI     Objective    BP 132/66 (BP Location: Left Arm, Patient Position: Sitting, Cuff Size: Normal)   Pulse 67   Wt 134 lb 9.6 oz (61.1 kg)   SpO2 100%   BMI 23.84 kg/m  Physical Exam Vitals reviewed.  Constitutional:      General: She is not in acute distress.    Appearance: Normal appearance. She is well-developed. She is not diaphoretic.  HENT:     Head: Normocephalic and atraumatic.     Nose: Nose normal.  Eyes:     General: No scleral icterus.       Right eye: No discharge.        Left eye: No discharge.     Extraocular Movements: Extraocular movements intact.     Conjunctiva/sclera: Conjunctivae normal.     Pupils: Pupils are equal, round, and reactive to light.  Neck:     Thyroid: No thyromegaly.  Cardiovascular:     Rate and Rhythm: Normal  rate and regular rhythm.     Pulses: Normal pulses.     Heart sounds: Normal heart sounds. No murmur heard. Pulmonary:     Effort: Pulmonary effort is normal. No respiratory distress.     Breath sounds: Normal breath sounds. No wheezing, rhonchi or rales.  Musculoskeletal:        General: Normal range of motion.     Cervical back: Normal range of motion and neck supple.     Right lower leg: No edema.     Left lower leg: No edema.  Lymphadenopathy:     Cervical: No cervical adenopathy.  Skin:    General: Skin is warm and dry.     Findings: No rash.  Neurological:     Mental Status: She is alert and oriented to person, place, and time. Mental status is at baseline.  Psychiatric:        Behavior: Behavior normal.        Thought Content: Thought content normal.        Judgment: Judgment normal.      No results found for any visits on 02/14/23.  Assessment & Plan     Left leg swelling Resolved Hx of left foot injury, was seen by podiatry recently who attributed leg swelling to trauma to the blood vessels in her foot. 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 US venous doppler of the left leg was negative for DVT from 01/22/23  Benign essential HTN Chronic and well-controlled. Today BP was 132/66 Continue current regimen and lifestyle modification Will Fu  Mixed hyperlipidemia Chronic and was previously controlled Continue current regimen Continue low fat diet and daily exercise Will FU  Stage 3b chronic kidney disease (HCC) Chronic and stable GFR 39 from 2 mo ago She needs to drink 8-12 glasses of water every day, avoid NSAIDs, and have renal functions check avery 6-12 months to ensure stability.    Return in about 3 months (around 05/15/2023) for chronic disease f/u.     The patient was advised to call back or seek an in-person evaluation if the symptoms worsen or if the condition fails to improve as anticipated.  I discussed the assessment and treatment plan with the  patient. The patient was provided an opportunity to ask questions and all were answered. The patient agreed with the plan and demonstrated an understanding of the instructions.  I, Mardene Speak, PA-C have reviewed all documentation for this visit. The documentation on  '@CurDate'$ @  for the exam, diagnosis, procedures, and orders are all accurate and complete.  Mardene Speak, [facility], [place] [phone] (phone) [phone] (fax)   [place]

## 2023-02-14 NOTE — Telephone Encounter (Signed)
Patient had OV today 02/14/23, will refill medication.  Requested Prescriptions  Pending Prescriptions Disp Refills   linaclotide (LINZESS) 290 MCG CAPS capsule [Pharmacy Med Name: Linzess 290 mcg capsule] 90 capsule 0    Sig: Take 1 capsule (290 mcg total) by mouth daily before breakfast. TAKE 1 CAPSULE BY MOUTH EVERY DAY BEFORE BREAKFAST     Gastroenterology: Irritable Bowel Syndrome Passed - 02/14/2023 10:01 AM      Passed - Valid encounter within last 12 months    Recent Outpatient Visits           Today    [facility] [place], [place], PA-C   3 weeks ago Left leg swelling   [place], [place], MD   1 month ago Type 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, without long-term current use of insulin ([place])   [place], [place], PA-C   3 months ago Type 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, without long-term current use of insulin [facility])   [place], [place], PA-C   5 months ago Hypoglycemia   [facility] Eulas Post, MD       Future Appointments             In 3 months Ostwalt, Letitia Libra, PA-C [place], [facility]

## 2023-02-19 DIAGNOSIS — H5789 Other specified disorders of eye and adnexa: Secondary | ICD-10-CM | POA: Diagnosis not present

## 2023-02-19 DIAGNOSIS — H0520 Unspecified exophthalmos: Secondary | ICD-10-CM | POA: Diagnosis not present

## 2023-02-19 DIAGNOSIS — H02531 Eyelid retraction right upper eyelid: Secondary | ICD-10-CM | POA: Diagnosis not present

## 2023-02-20 ENCOUNTER — Telehealth: Payer: Self-pay | Admitting: Physician Assistant

## 2023-02-20 ENCOUNTER — Other Ambulatory Visit: Payer: Self-pay

## 2023-02-20 DIAGNOSIS — E440 Moderate protein-calorie malnutrition: Secondary | ICD-10-CM

## 2023-02-20 MED ORDER — MIRTAZAPINE 30 MG PO TABS: ORAL_TABLET | ORAL | 1 refills | Status: DC

## 2023-02-21 ENCOUNTER — Ambulatory Visit: Payer: 59 | Admitting: Physician Assistant

## 2023-02-21 MED ORDER — MIRTAZAPINE 15 MG PO TBDP: 15.0000 mg | ORAL_TABLET | Freq: Every day | ORAL | 1 refills | Status: DC

## 2023-02-22 ENCOUNTER — Other Ambulatory Visit: Payer: Self-pay | Admitting: Oculoplastics Ophthalmology

## 2023-02-22 DIAGNOSIS — H5789 Other specified disorders of eye and adnexa: Secondary | ICD-10-CM

## 2023-02-22 DIAGNOSIS — H0520 Unspecified exophthalmos: Secondary | ICD-10-CM

## 2023-02-23 IMAGING — MR MR HEAD W/O CM
11 of 13 series · 36 of 48 positions shown · non-contrast
Comparison: 2948

CLINICAL DATA: Cognitive impairment

EXAM:
MRI HEAD WITHOUT CONTRAST
TECHNIQUE: Multiplanar, multiecho pulse sequences of the brain and surrounding
structures were obtained without intravenous contrast.
Additionally, using NeuroQuant software a 3D volumetric analysis of
the brain was performed and is compared to a normative database
adjusted for age, gender and intracranial volume.

[Series 4: T2 · axial · 5.0mm · 0.47mm/px · z∈[-120,+12]mm · 3 of 25 slices shown (1 of 2)]
[im 1/25]
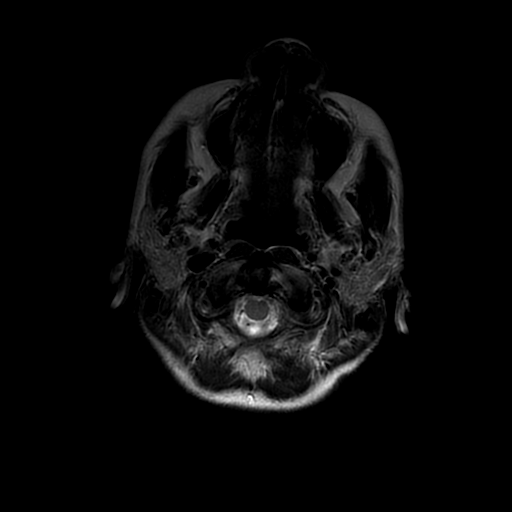
[im 13/25]
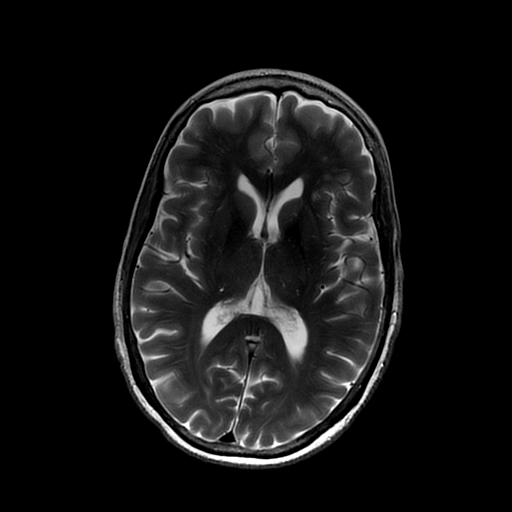
[im 25/25]
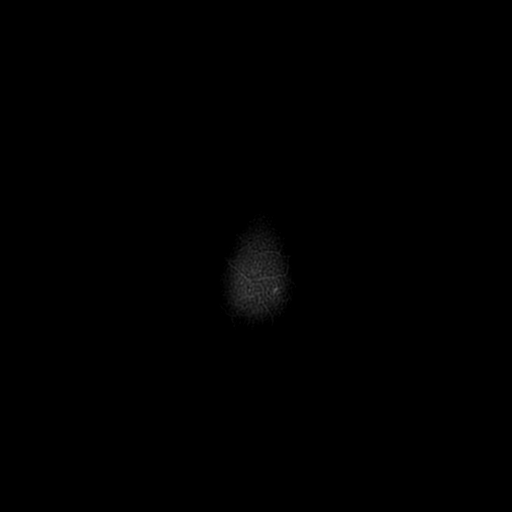

[Series 5: DWI · axial · 3.0mm · 0.94mm/px · z∈[-121,+13]mm · 6 of 100 slices shown (1 of 4)]
[im 1/100]
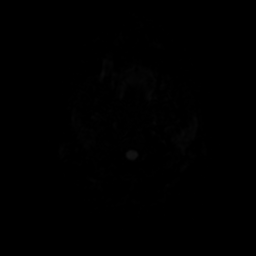
[im 20/100]
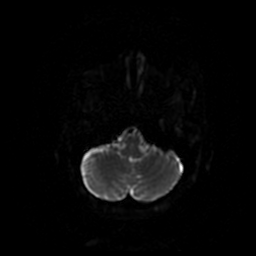
[im 40/100]
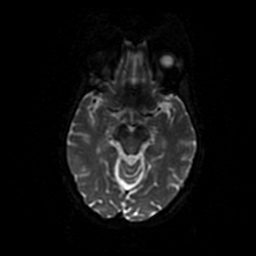
[im 60/100]
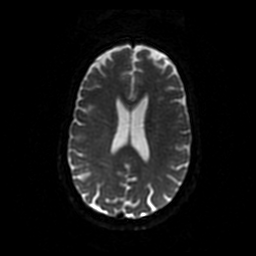
[im 80/100]
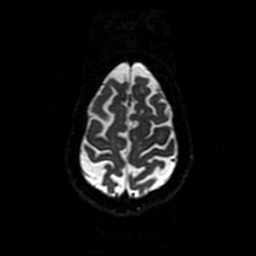
[im 100/100]
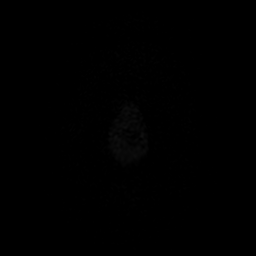

[Series 6: FLAIR · axial · 4.0mm · 0.41mm/px · z∈[-113,+17]mm · 2 of 27 slices shown (1 of 2)]
[im 1/27]
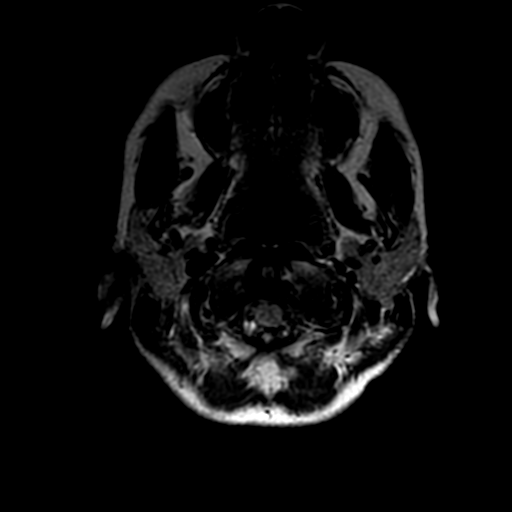
[im 27/27]
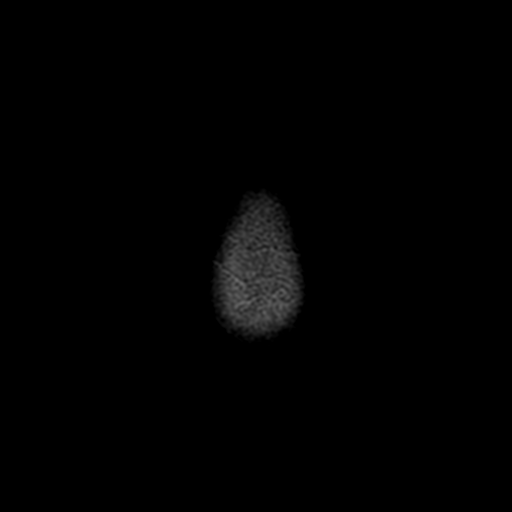

[Series 7: FLAIR · sagittal · 5.0mm · 0.47mm/px · 1 of 23 slices shown (2 of 2)]
[im 1/23]
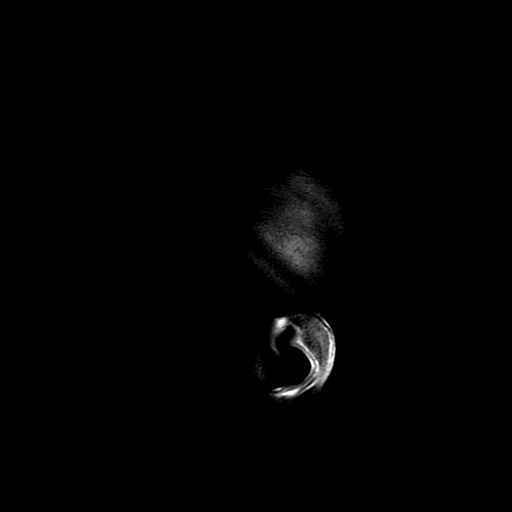

[Series 8: DWI · coronal · 4.0mm · 0.94mm/px · 4 of 74 slices shown (2 of 4)]
[im 1/74]
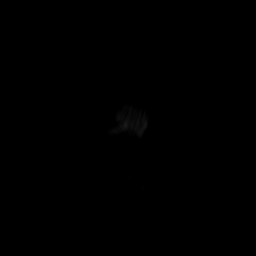
[im 25/74]
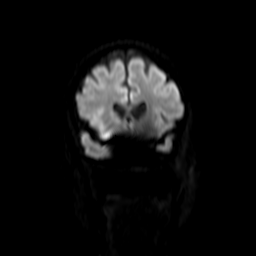
[im 49/74]
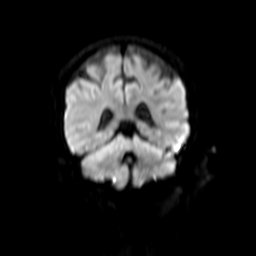
[im 74/74]
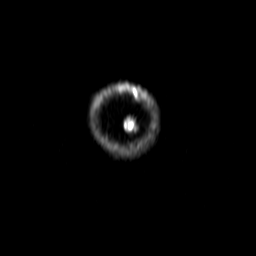

[Series 9: SWI · axial · 3.0mm · 0.47mm/px · z∈[-122,-68]mm · 3 of 100 slices shown]
[im 1/100]
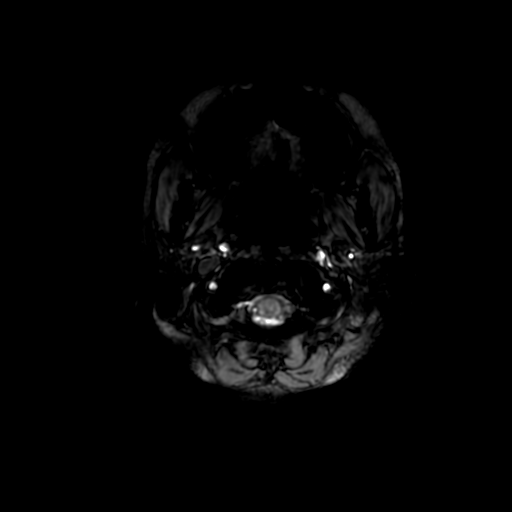
[im 20/100]
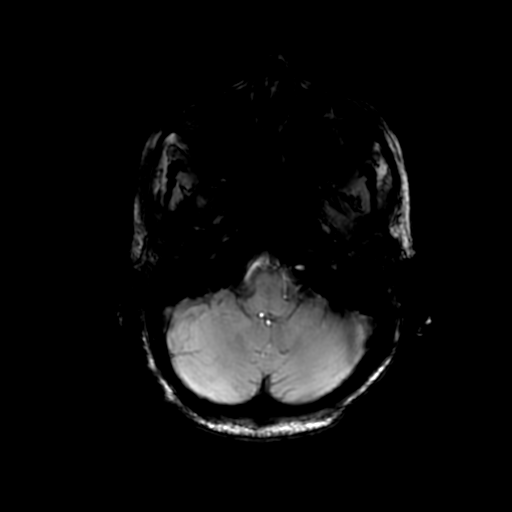
[im 40/100]
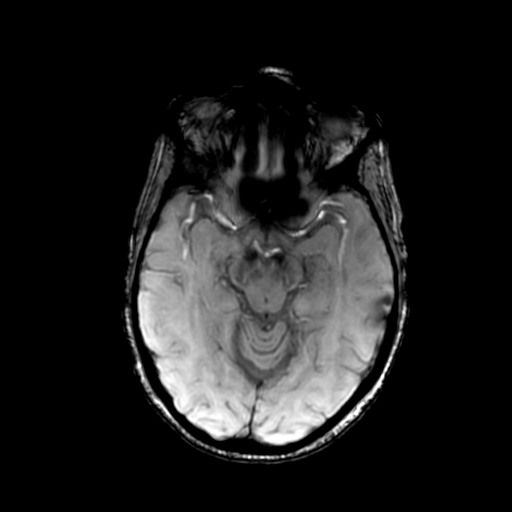

[Series 11: T2 · coronal · 5.0mm · 0.39mm/px · 2 of 31 slices shown (2 of 2)]
[im 1/31]
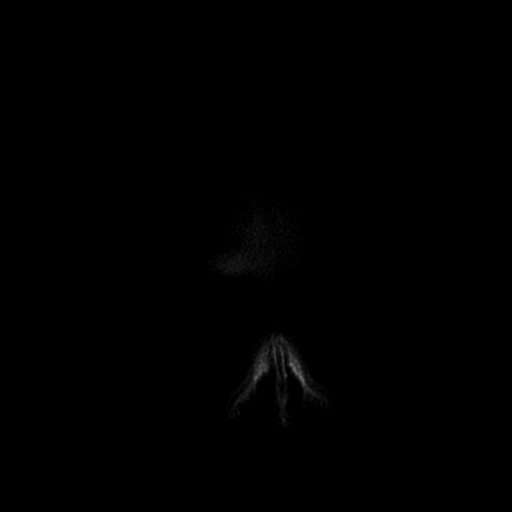
[im 31/31]
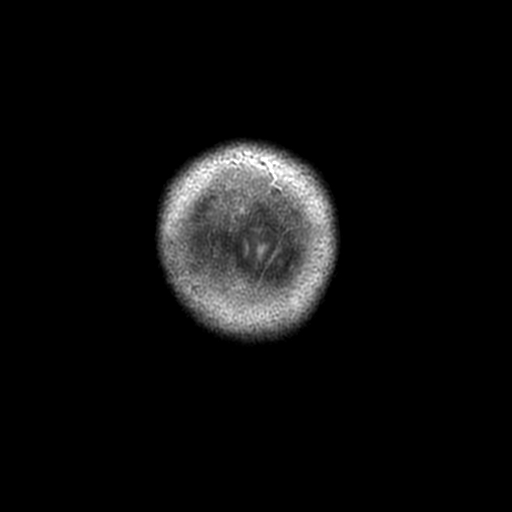

[Series 510: DWI · axial · 3.0mm · 0.94mm/px · z∈[-121,+13]mm · 6 of 100 slices shown (3 of 4)]
[im 1/100]
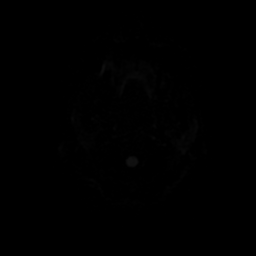
[im 20/100]
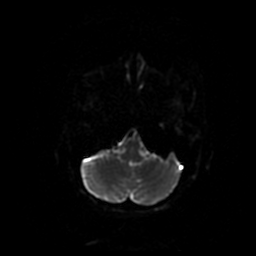
[im 40/100]
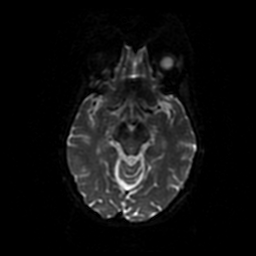
[im 60/100]
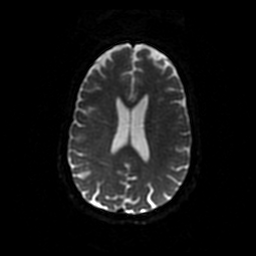
[im 80/100]
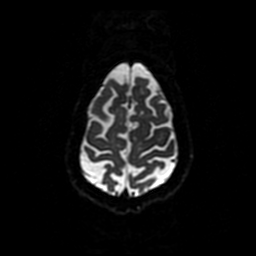
[im 100/100]
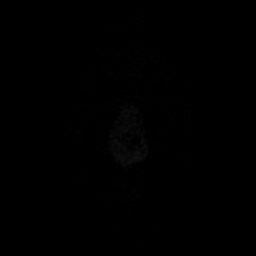

[Series 550: ADC · axial · 3.0mm · 0.94mm/px · z∈[-121,+13]mm · 3 of 50 slices shown (1 of 2)]
[im 1/50]
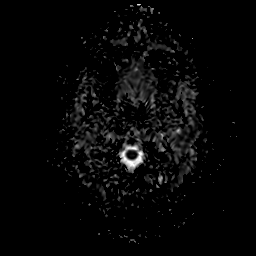
[im 25/50]
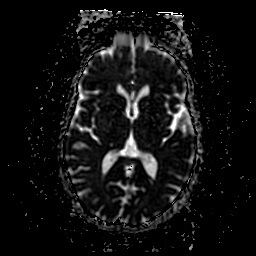
[im 50/50]
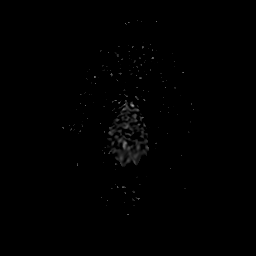

[Series 810: DWI · coronal · 4.0mm · 0.94mm/px · 4 of 74 slices shown (4 of 4)]
[im 1/74]
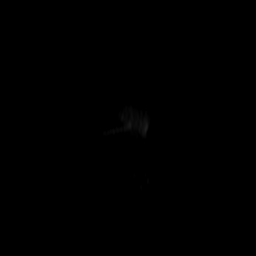
[im 25/74]
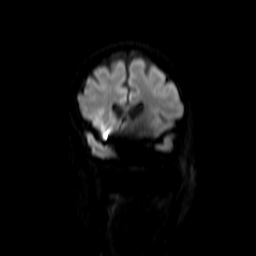
[im 49/74]
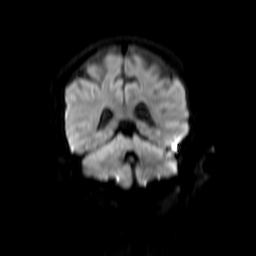
[im 74/74]
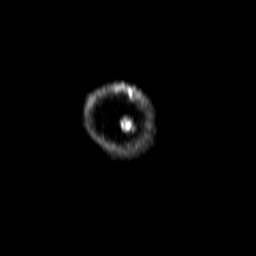

[Series 850: ADC · coronal · 4.0mm · 0.94mm/px · 2 of 37 slices shown (2 of 2)]
[im 1/37]
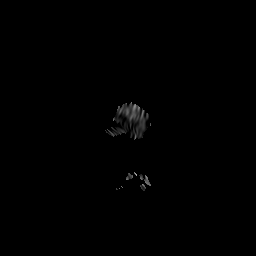
[im 37/37]
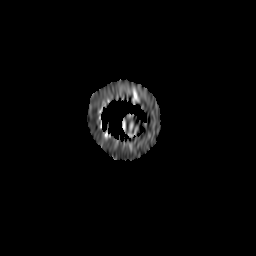

[36 of 48 positions shown; findings below may reference images not displayed]

FINDINGS: Brain: There is no acute infarction or intracranial hemorrhage.
There is no intracranial mass, mass effect, or edema. There is no
hydrocephalus or extra-axial fluid collection. Slight increase in
prominence of the ventricles and sulci reflecting mild degree of
interval parenchymal volume loss. Patchy small foci of T2
hyperintensity in the supratentorial white matter are nonspecific
but probably reflects similar burden of mild chronic microvascular
ischemic changes.

Vascular: Major vessel flow voids at the skull base are preserved.

Skull and upper cervical spine: Marrow signal within normal limits.
Degenerative changes at the right temporomandibular joint.

Sinuses/Orbits: Paranasal sinuses are aerated. No significant
orbital abnormality.

Other: Sella is unremarkable.  Mastoid air cells are clear.

NeuroQuant Findings:

Volumetric analysis of the brain was performed, with a fully
detailed report in [HOSPITAL] PACS. Briefly, the comparison with age and
gender matched reference reveals hippocampi at the 7th percentile
and whole brain volume at the 61st percentile.
IMPRESSION: 1. No intracranial mass or acute abnormality.
2. Slight increase in parenchymal volume loss since 2948. Similar
mild chronic microvascular ischemic changes.
3. NeuroQuant volumetric analysis of the brain, see details on PACS.

## 2023-02-27 ENCOUNTER — Ambulatory Visit: Admission: RE | Admit: 2023-02-27 | Payer: 59 | Source: Ambulatory Visit

## 2023-03-04 ENCOUNTER — Inpatient Hospital Stay: Payer: 59 | Attending: Oncology

## 2023-03-04 DIAGNOSIS — D509 Iron deficiency anemia, unspecified: Secondary | ICD-10-CM | POA: Diagnosis not present

## 2023-03-04 DIAGNOSIS — N289 Disorder of kidney and ureter, unspecified: Secondary | ICD-10-CM | POA: Diagnosis not present

## 2023-03-04 LAB — CBC WITH DIFFERENTIAL/PLATELET
Abs Immature Granulocytes: 0.02 K/uL (ref 0.00–0.07)
Basophils Absolute: 0 K/uL (ref 0.0–0.1)
Basophils Relative: 0 %
Eosinophils Absolute: 0.1 K/uL (ref 0.0–0.5)
Eosinophils Relative: 2 %
HCT: 34.3 % — ABNORMAL LOW (ref 36.0–46.0)
Hemoglobin: 10.5 g/dL — ABNORMAL LOW (ref 12.0–15.0)
Immature Granulocytes: 0 %
Lymphocytes Relative: 33 %
Lymphs Abs: 2.2 K/uL (ref 0.7–4.0)
MCH: 26.2 pg (ref 26.0–34.0)
MCHC: 30.6 g/dL (ref 30.0–36.0)
MCV: 85.5 fL (ref 80.0–100.0)
Monocytes Absolute: 0.5 K/uL (ref 0.1–1.0)
Monocytes Relative: 8 %
Neutro Abs: 3.8 K/uL (ref 1.7–7.7)
Neutrophils Relative %: 57 %
Platelets: 303 K/uL (ref 150–400)
RBC: 4.01 MIL/uL (ref 3.87–5.11)
RDW: 14.1 % (ref 11.5–15.5)
WBC: 6.8 K/uL (ref 4.0–10.5)
nRBC: 0 % (ref 0.0–0.2)

## 2023-03-04 LAB — IRON AND TIBC
Iron: 52 ug/dL (ref 28–170)
Saturation Ratios: 22 % (ref 10.4–31.8)
TIBC: 234 ug/dL — ABNORMAL LOW (ref 250–450)
UIBC: 182 ug/dL

## 2023-03-04 LAB — FERRITIN: Ferritin: 430 ng/mL — ABNORMAL HIGH (ref 11–307)

## 2023-03-04 MED FILL — Iron Sucrose Inj 20 MG/ML (Fe Equiv): INTRAVENOUS | Qty: 10 | Status: AC

## 2023-03-05 ENCOUNTER — Encounter: Payer: Self-pay | Admitting: Oncology

## 2023-03-05 ENCOUNTER — Inpatient Hospital Stay: Payer: 59

## 2023-03-05 ENCOUNTER — Inpatient Hospital Stay (HOSPITAL_BASED_OUTPATIENT_CLINIC_OR_DEPARTMENT_OTHER): Payer: 59 | Admitting: Oncology

## 2023-03-05 VITALS — BP 135/73 | HR 64 | Temp 97.3°F | Resp 18 | Ht 63.0 in | Wt 137.4 lb

## 2023-03-05 DIAGNOSIS — N289 Disorder of kidney and ureter, unspecified: Secondary | ICD-10-CM | POA: Diagnosis not present

## 2023-03-05 DIAGNOSIS — D509 Iron deficiency anemia, unspecified: Secondary | ICD-10-CM

## 2023-03-05 NOTE — Progress Notes (Signed)
[place]  [phone] [phone]  ID: Julie Acevedo OB: [birth year]  MR#: [id number]  [id number]  Patient Care Team: Mardene Speak, Hershal Coria as PCP - General (Physician Assistant) Yolonda Kida, MD as PCP - Cardiology (Cardiology) Lorelee Cover., MD as Consulting Physician (Ophthalmology) Bjorn Loser, MD as Consulting Physician (Urology) Dahlia Byes, Marjory Lies, MD as Consulting Physician (General Surgery) Gardiner Barefoot, DPM as Consulting Physician (Podiatry) Gae Dry, MD as Referring Physician (Obstetrics and Gynecology) Vladimir Crofts, MD as Consulting Physician (Neurology) Randon Goldsmith, OT as Occupational Therapist (Occupational Therapy) Lloyd Huger, MD as Consulting Physician (Oncology) Vanita Ingles, RN as Case Manager (General Practice)   CHIEF COMPLAINT: Iron deficiency anemia.  INTERVAL HISTORY: Patient returns to clinic today for repeat laboratory work, further evaluation, and consideration of additional IV Venofer.  She continues to feel well and remains asymptomatic.  She does not complain of any weakness or fatigue today.  She has no neurologic complaints.  She denies any recent fevers or illnesses.  She has no chest pain, cough, hemoptysis, or shortness of breath.  She denies any nausea, vomiting, constipation, or diarrhea.  She denies any melena or hematochezia.  She has no urinary complaints.  Patient offers no specific complaints today.  REVIEW OF SYSTEMS:   Review of Systems  Constitutional: Negative.  Negative for fever, malaise/fatigue and weight loss.  Respiratory: Negative.  Negative for cough and shortness of breath.   Cardiovascular: Negative.  Negative for chest pain and leg swelling.  Gastrointestinal: Negative.  Negative for abdominal pain, blood in stool and melena.  Genitourinary: Negative.  Negative for hematuria.  Musculoskeletal: Negative.  Negative for back pain.  Skin: Negative.   Negative for rash.  Neurological:  Negative for dizziness, focal weakness, weakness and headaches.  Psychiatric/Behavioral: Negative.  The patient is not nervous/anxious.     As per HPI. Otherwise, a complete review of systems is negative.  PAST MEDICAL HISTORY: Past Medical History:  Diagnosis Date   Anemia    Arthritis    COVID-19 11/19/2019   Diverticulitis    DM (diabetes mellitus) ([place])    typ e 2   History of methicillin resistant staphylococcus aureus (MRSA) 2017   HTN (hypertension)    Hyperlipidemia    Memory difficulties 09/01/2015   Multiple thyroid nodules    Myocardial infarction ([place]) 1995   Short-term memory loss    Thyroid nodule     PAST SURGICAL HISTORY: Past Surgical History:  Procedure Laterality Date   ABDOMINAL HYSTERECTOMY     due to endometriosis-1 ovary left   BREAST EXCISIONAL BIOPSY Right 1971   neg   CARDIAC CATHETERIZATION  2000   no significant CAD per note of Dr Caryl Comes in 2013    [place] N/A 03/19/2017   Procedure: ANTERIOR REPAIR (CYSTOCELE);  Surgeon: Bjorn Loser, MD;  Location: [facility];  Service: Urology;  Laterality: N/A;   CYSTOSCOPY N/A 03/19/2017   Procedure: CYSTOSCOPY;  Surgeon: Bjorn Loser, MD;  Location: [facility];  Service: Urology;  Laterality: N/A;   ESOPHAGOGASTRODUODENOSCOPY (EGD) WITH PROPOFOL N/A 12/15/2021   Procedure: ESOPHAGOGASTRODUODENOSCOPY (EGD) WITH PROPOFOL;  Surgeon: Lesly Rubenstein, MD;  Location: [facility];  Service: Endoscopy;  Laterality: N/A;   EYE SURGERY     FOOT SURGERY     HAND SURGERY     HEMICOLECTOMY     INSERTION OF MESH N/A 08/21/2016   Procedure: INSERTION OF MESH;  Surgeon: Marland Kitchen  Hoxworth, MD;  Location: [facility];  Service: General;  Laterality: N/A;   INSERTION OF MESH N/A 12/08/2019   Procedure: INSERTION OF MESH;  Surgeon: Jules Husbands, MD;  Location: [facility];  Service: General;  Laterality: N/A;   LAPAROSCOPIC LYSIS OF ADHESIONS N/A 08/21/2016    Procedure: LAPAROSCOPIC LYSIS OF ADHESIONS;  Surgeon: Excell Seltzer, MD;  Location: [facility];  Service: General;  Laterality: N/A;   LAPAROSCOPIC REMOVAL ABDOMINAL MASS     LAPAROTOMY N/A 04/16/2019   Procedure: EXPLORATORY LAPAROTOMY;  Surgeon: Benjamine Sprague, DO;  Location: [facility];  Service: General;  Laterality: N/A;   LOOP RECORDER INSERTION N/A 03/21/2018   Procedure: LOOP RECORDER INSERTION;  Surgeon: Sanda Klein, MD;  Location: [facility];  Service: Cardiovascular;  Laterality: N/A;   LUMBAR DISC SURGERY     ROUX-EN-Y GASTRIC BYPASS  2010   Dr Hassell Done   SMALL INTESTINE SURGERY     TONSILLECTOMY     UPPER GI ENDOSCOPY  01/12/2016   normal larynx, normal esophagus. gastric bypass with a normal-sized pouch and intact staple line, normal examined jejunum, otherwise exam was normal   VENTRAL HERNIA REPAIR N/A 08/21/2016   Procedure: LAPAROSCOPIC VENTRAL HERNIA;  Surgeon: Excell Seltzer, MD;  Location: [facility];  Service: General;  Laterality: N/A;   XI ROBOTIC ASSISTED VENTRAL HERNIA N/A 12/08/2019   Procedure: XI ROBOTIC ASSISTED VENTRAL HERNIA;  Surgeon: Jules Husbands, MD;  Location: [facility];  Service: General;  Laterality: N/A;    FAMILY HISTORY: Family History  Problem Relation Age of Onset   Cancer Father 72       Lung   Coronary artery disease Father 42   COPD Mother    Diabetes Mother    Hypertension Mother    Cancer Paternal Grandmother        Breast   Breast cancer Paternal Grandmother    Congestive Heart Failure Maternal Grandmother    Emphysema Maternal Grandfather    Heart attack Paternal Grandfather    Cancer Paternal Aunt        Breast   Breast cancer Paternal Aunt 69   Breast cancer Paternal Aunt 8    ADVANCED DIRECTIVES (Y/N):  N  HEALTH MAINTENANCE: Social History   Tobacco Use   Smoking status: Never   Smokeless tobacco: Never  Vaping Use   Vaping Use: Never used  Substance Use Topics   Alcohol use: No   Drug use: No      Colonoscopy:  PAP:  Bone density:  Lipid panel:  Allergies  Allergen Reactions   Lotrel [Amlodipine Besy-Benazepril Hcl] Anaphylaxis   Contrast Media [Iodinated Contrast Media] Swelling and Other (See Comments)    Causes neck swelling    Niacin Hives   Sulfa Antibiotics Hives    Current Outpatient Medications  Medication Sig Dispense Refill   ACCU-CHEK GUIDE test strip USE TO CHECK BLOOD SUGAR UP TO 4 TIMES A DAY AS DIRECTED 100 strip 1   Accu-Chek Softclix Lancets lancets SMARTSIG:Topical 1-4 Times Daily     blood glucose meter kit and supplies Dispense Accu-Chek Guide. Use up to four times daily as directed. 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 1 each 0   calcitRIOL (ROCALTROL) 0.25 MCG capsule Take 0.25 mcg by mouth daily.     gabapentin (NEURONTIN) 300 MG capsule Take 1 capsule (300 mg total) by mouth 3 (three) times daily. 270 capsule 1   Glucagon (GVOKE HYPOPEN 2-PACK) 1 MG/0.2ML SOAJ Inject 0.2 mLs into the skin as  needed. 0.2 mL 2   hydrOXYzine (VISTARIL) 100 MG capsule Take 1 capsule (100 mg total) by mouth 3 (three) times daily. 270 capsule 1   levETIRAcetam (KEPPRA) 500 MG tablet Take 1 tablet (500 mg total) by mouth 2 (two) times daily. 60 tablet 3   linaclotide (LINZESS) 290 MCG CAPS capsule Take 1 capsule (290 mcg total) by mouth daily before breakfast. TAKE 1 CAPSULE BY MOUTH EVERY DAY BEFORE BREAKFAST 90 capsule 0   metoprolol succinate (TOPROL-XL) 25 MG 24 hr tablet TAKE 1 TABLET (25 MG TOTAL) BY MOUTH DAILY. 90 tablet 1   mirtazapine (REMERON SOL-TAB) 15 MG disintegrating tablet Take 1 tablet (15 mg total) by mouth at bedtime. 30 tablet 1   mirtazapine (REMERON) 30 MG tablet TAKE ONE TABLET BY MOUTH EVERYDAY AT BEDTIME 30 tablet 1   omeprazole (PRILOSEC) 20 MG capsule Take 1 capsule (20 mg total) by mouth daily. 90 capsule 1   ondansetron (ZOFRAN) 4 MG tablet TAKE 1 TABLET BY MOUTH EVERY 8 HOURS AS NEEDED (Patient taking differently: Take 4 mg by mouth every 8 (eight)  hours as needed for nausea or vomiting.) 30 tablet 2   rivastigmine (EXELON) 3 MG capsule Take 3 mg by mouth 2 (two) times daily.     rosuvastatin (CRESTOR) 40 MG tablet Take 1 tablet (40 mg total) by mouth daily. 90 tablet 3   traMADol (ULTRAM) 50 MG tablet Take 1 tablet (50 mg total) by mouth every 6 (six) hours as needed for moderate pain or severe pain. 100 tablet 3   triamcinolone cream (KENALOG) 0.5 % Apply 1 Application topically 3 (three) times daily as needed (flares).     metFORMIN (GLUCOPHAGE) 500 MG tablet Take by mouth 2 (two) times daily with a meal. (Patient not taking: Reported on 03/05/2023)     polyethylene glycol powder (MIRALAX) 17 GM/SCOOP powder Take 1 scoop or 17 g 3 times a day for 2 days then use 1 scoop twice a day a day until you begin having soft bowel movements then cut back to 1 scoop a day. (Patient not taking: Reported on 03/05/2023) 255 g 0   No current facility-administered medications for this visit.    OBJECTIVE: Vitals:   03/05/23 1433  BP: 135/73  Pulse: 64  Resp: 18  Temp: (!) 97.3 F (36.3 C)  SpO2: 100%     Body mass index is 24.34 kg/m.    ECOG FS:0 - Asymptomatic  General: Well-developed, well-nourished, no acute distress. Eyes: Pink conjunctiva, anicteric sclera. HEENT: Normocephalic, moist mucous membranes. Lungs: No audible wheezing or coughing. Heart: Regular rate and rhythm. Abdomen: Soft, nontender, no obvious distention. Musculoskeletal: No edema, cyanosis, or clubbing. Neuro: Alert, answering all questions appropriately. Cranial nerves grossly intact. Skin: No rashes or petechiae noted. Psych: Normal affect.  LAB RESULTS:  Lab Results  Component Value Date   NA 141 11/20/2022   K 3.8 11/20/2022   CL 109 11/20/2022   CO2 23 11/20/2022   GLUCOSE CANCELED 12/25/2022   BUN 11 11/20/2022   CREATININE 1.46 (H) 11/20/2022   CALCIUM 8.8 (L) 11/20/2022   PROT 6.7 11/19/2022   ALBUMIN 3.5 11/19/2022   AST 37 11/19/2022   ALT 36  11/19/2022   ALKPHOS 151 (H) 11/19/2022   BILITOT 0.1 (L) 11/19/2022   GFRNONAA 39 (L) 11/20/2022   GFRAA 42 (L) 12/05/2020    Lab Results  Component Value Date   WBC 6.8 03/04/2023   NEUTROABS 3.8 03/04/2023   HGB 10.5 (L)  03/04/2023   HCT 34.3 (L) 03/04/2023   MCV 85.5 03/04/2023   PLT 303 03/04/2023    Lab Results  Component Value Date   IRON 52 03/04/2023   TIBC 234 (L) 03/04/2023   IRONPCTSAT 22 03/04/2023   Lab Results  Component Value Date   FERRITIN 430 (H) 03/04/2023    STUDIES: No results found.   ASSESSMENT: Iron deficiency anemia.  PLAN:   Iron deficiency anemia: Patient's hemoglobin remains mildly decreased, but stable at 10.5.  Her iron stores continue to be essentially within normal limits.  Previously, all of her other laboratory work including hemolysis labs, B12, folate, and SPEP were negative or within normal limits.  Patient had a colonoscopy on September 05, 2018 that was reported as normal.  She does not require IV Venofer today.  Patient last received treatment on December 13, 2021.  After lengthy discussion with the patient, is agreed upon that no further follow-up is necessary.  Please refer patient back if there are any questions or concerns. Elevated ferritin: Unclear clinical significance.  Possibly an acute phase of accident. Abdominal pain: Resolved. Renal insufficiency: Chronic and unchanged.  Patient expressed understanding and was in agreement with this plan. She also understands that She can call clinic at any time with any questions, concerns, or complaints.    Lloyd Huger, MD   03/05/2023 4:01 PM

## 2023-03-12 ENCOUNTER — Other Ambulatory Visit: Payer: Self-pay | Admitting: Physician Assistant

## 2023-03-12 DIAGNOSIS — E440 Moderate protein-calorie malnutrition: Secondary | ICD-10-CM

## 2023-03-13 NOTE — Telephone Encounter (Signed)
Unable to refill per protocol, Rx request is too soon. Last refill 02/20/23 for 30 days and 1 refill.  Requested Prescriptions  Pending Prescriptions Disp Refills   mirtazapine (REMERON) 30 MG tablet [Pharmacy Med Name: mirtazapine 30 mg tablet] 30 tablet 0    Sig: TAKE ONE TABLET BY MOUTH EVERYDAY AT BEDTIME     Psychiatry: Antidepressants - mirtazapine Passed - 03/12/2023  3:05 PM      Passed - Valid encounter within last 6 months    Recent Outpatient Visits           3 weeks ago Benign essential HTN   [place], [place], PA-C   1 month ago Left leg swelling   [place], [place], MD   2 months ago Type 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, without long-term current use of insulin ([place])   [place], [place], PA-C   3 months ago Type 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, without long-term current use of insulin [facility])   [place], [place], PA-C   6 months ago Hypoglycemia   [facility] Eulas Post, MD       Future Appointments             In 2 months Ostwalt, Letitia Libra, PA-C [place], [facility]

## 2023-03-14 ENCOUNTER — Other Ambulatory Visit: Payer: Self-pay | Admitting: Physician Assistant

## 2023-03-14 ENCOUNTER — Telehealth: Payer: Self-pay | Admitting: Physician Assistant

## 2023-03-14 ENCOUNTER — Ambulatory Visit
Admission: RE | Admit: 2023-03-14 | Discharge: 2023-03-14 | Disposition: A | Discharge status: Home / Self Care | Payer: 59 | Source: Ambulatory Visit | Attending: Oculoplastics Ophthalmology | Admitting: Oculoplastics Ophthalmology

## 2023-03-14 DIAGNOSIS — H0520 Unspecified exophthalmos: Secondary | ICD-10-CM | POA: Diagnosis not present

## 2023-03-14 DIAGNOSIS — N9419 Other specified dyspareunia: Secondary | ICD-10-CM

## 2023-03-14 DIAGNOSIS — E079 Disorder of thyroid, unspecified: Secondary | ICD-10-CM | POA: Insufficient documentation

## 2023-03-14 DIAGNOSIS — E440 Moderate protein-calorie malnutrition: Secondary | ICD-10-CM

## 2023-03-14 DIAGNOSIS — H5789 Other specified disorders of eye and adnexa: Secondary | ICD-10-CM | POA: Insufficient documentation

## 2023-03-14 DIAGNOSIS — H05229 Edema of unspecified orbit: Secondary | ICD-10-CM | POA: Diagnosis not present

## 2023-03-14 DIAGNOSIS — K219 Gastro-esophageal reflux disease without esophagitis: Secondary | ICD-10-CM

## 2023-03-14 DIAGNOSIS — M5416 Radiculopathy, lumbar region: Secondary | ICD-10-CM

## 2023-03-14 MED ORDER — GABAPENTIN 300 MG PO CAPS: 300.0000 mg | ORAL_CAPSULE | Freq: Three times a day (TID) | ORAL | 0 refills | Status: DC

## 2023-03-14 MED ORDER — OMEPRAZOLE 20 MG PO CPDR: 20.0000 mg | DELAYED_RELEASE_CAPSULE | Freq: Every day | ORAL | 0 refills | Status: DC

## 2023-03-14 MED ORDER — HYDROXYZINE PAMOATE 100 MG PO CAPS: 100.0000 mg | ORAL_CAPSULE | Freq: Three times a day (TID) | ORAL | 0 refills | Status: DC

## 2023-03-14 NOTE — Telephone Encounter (Signed)
Requested Prescriptions  Pending Prescriptions Disp Refills   omeprazole (PRILOSEC) 20 MG capsule 90 capsule 0    Sig: Take 1 capsule (20 mg total) by mouth daily.     Gastroenterology: Proton Pump Inhibitors Passed - 03/14/2023 11:00 AM      Passed - Valid encounter within last 12 months    Recent Outpatient Visits           4 weeks ago Benign essential HTN   [place], [place], PA-C   1 month ago Left leg swelling   [place] Metcalfe, [place], MD   2 months ago Type 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, without long-term current use of insulin ([place])   [place], [place], PA-C   3 months ago Type 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, without long-term current use of insulin ([place])   [place], [place], PA-C   6 months ago Hypoglycemia   [facility] Eulas Post, MD       Future Appointments             In 2 months Ostwalt, Letitia Libra, PA-C [place], [facility]             hydrOXYzine (VISTARIL) 100 MG capsule 270 capsule 0    Sig: Take 1 capsule (100 mg total) by mouth 3 (three) times daily.     Ear, Nose, and Throat:  Antihistamines 2 Failed - 03/14/2023 11:00 AM      Failed - Cr in normal range and within 360 days    Creat  Date Value Ref Range Status  10/30/2017 1.35 (H) 0.50 - 0.99 mg/dL Final    Comment:    For patients >25 years of age, the reference limit for Creatinine is approximately 13% higher for people identified as African-American. .    Creatinine, Ser  Date Value Ref Range Status  11/20/2022 1.46 (H) 0.44 - 1.00 mg/dL Final         Passed - Valid encounter within last 12 months    Recent Outpatient Visits           4 weeks ago Benign essential HTN   [place], [place], PA-C   1  month ago Left leg swelling   [place], [place], MD   2 months ago Type 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, without long-term current use of insulin ([place])   [place], [place], PA-C   3 months ago Type 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, without long-term current use of insulin ([place])   [place], [place], PA-C   6 months ago Hypoglycemia   [facility] Eulas Post, MD       Future Appointments             In 2 months Ostwalt, Letitia Libra, PA-C [place], [facility]             gabapentin (NEURONTIN) 300 MG capsule 270 capsule 0    Sig: Take 1 capsule (300 mg total) by mouth 3 (three) times daily.     Neurology: Anticonvulsants - gabapentin Failed - 03/14/2023 11:00 AM      Failed - Cr in normal range and within 360 days  Creat  Date Value Ref Range Status  10/30/2017 1.35 (H) 0.50 - 0.99 mg/dL Final    Comment:    For patients >18 years of age, the reference limit for Creatinine is approximately 13% higher for people identified as African-American. .    Creatinine, Ser  Date Value Ref Range Status  11/20/2022 1.46 (H) 0.44 - 1.00 mg/dL Final         Passed - Completed PHQ-2 or PHQ-9 in the last 360 days      Passed - Valid encounter within last 12 months    Recent Outpatient Visits           4 weeks ago Benign essential HTN   [place], [place], PA-C   1 month ago Left leg swelling   [place], [place], MD   2 months ago Type 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, without long-term current use of insulin ([place])   [place], [place], PA-C   3 months ago Type 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, without  long-term current use of insulin [facility])   [place], [place], PA-C   6 months ago Hypoglycemia   [facility] Eulas Post, MD       Future Appointments             In 2 months Ostwalt, Letitia Libra, PA-C [place], [facility]

## 2023-03-14 NOTE — Telephone Encounter (Deleted)
Clarification needed on if patient should be taking mirtazapine (REMERON SOL-TAB) 15 MG disintegrating tablet or mirtazapine (REMERON) 30 MG tablet or both. Patient would like rx she should be taking resent to  [place], [place] - [address] Phone: [phone]  Fax: [phone]    Pres[facility], however patient does not use this pharmacy.

## 2023-03-14 NOTE — Telephone Encounter (Signed)
Medication Refill - Medication:  gabapentin (NEURONTIN) 300 MG capsule  hydrOXYzine (VISTARIL) 100 MG capsule  omeprazole (PRILOSEC) 20 MG capsule   Has the patient contacted their pharmacy? Yes.   (Agent: If no, request that the patient contact the pharmacy for the refill. If patient does not wish to contact the pharmacy document the reason why and proceed with request.) (Agent: If yes, when and what did the pharmacy advise?)  Preferred Pharmacy (with phone number or street name):  [facility] - [place], [place] - [address] Phone: [phone]  Fax: [phone]     Has the patient been seen for an appointment in the last year OR does the patient have an upcoming appointment? Yes.    Agent: Please be advised that RX refills may take up to 3 business days. We ask that you follow-up with your pharmacy.

## 2023-03-14 NOTE — Telephone Encounter (Signed)
Requested Prescriptions  Refused Prescriptions Disp Refills   mirtazapine (REMERON) 30 MG tablet [Pharmacy Med Name: MIRTAZAPINE 30 MG TABLET] 90 tablet 1    Sig: TAKE 1 TABLET BY MOUTH EVERYDAY AT BEDTIME     Psychiatry: Antidepressants - mirtazapine Passed - 03/14/2023  2:35 PM      Passed - Valid encounter within last 6 months    Recent Outpatient Visits           4 weeks ago Benign essential HTN   [place], [place], PA-C   1 month ago Left leg swelling   [place], [place], MD   2 months ago Type 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, without long-term current use of insulin ([place])   [place], [place], PA-C   3 months ago Type 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, without long-term current use of insulin [facility])   [place], [place], PA-C   6 months ago Hypoglycemia   [facility] Eulas Post, MD       Future Appointments             In 2 months Ostwalt, Letitia Libra, PA-C [place], [facility]

## 2023-03-14 NOTE — Telephone Encounter (Signed)
Clarification needed on if patient should be taking mirtazapine (REMERON SOL-TAB) 15 MG disintegrating tablet or mirtazapine (REMERON) 30 MG tablet or both. Patient would like rx she should be taking resent to  [place], [place] - [address] Phone: [phone]  Fax: [phone]    [facility] however patient does not use this pharmacy.

## 2023-03-15 IMAGING — DX DG ANKLE COMPLETE 3+V*L*
3 series · 3 of 3 positions shown · non-contrast
Comparison: None.

CLINICAL DATA: Swelling, discoloration, possible insect bite

EXAM:
LEFT ANKLE COMPLETE - 3+ VIEW

[ankle ap]
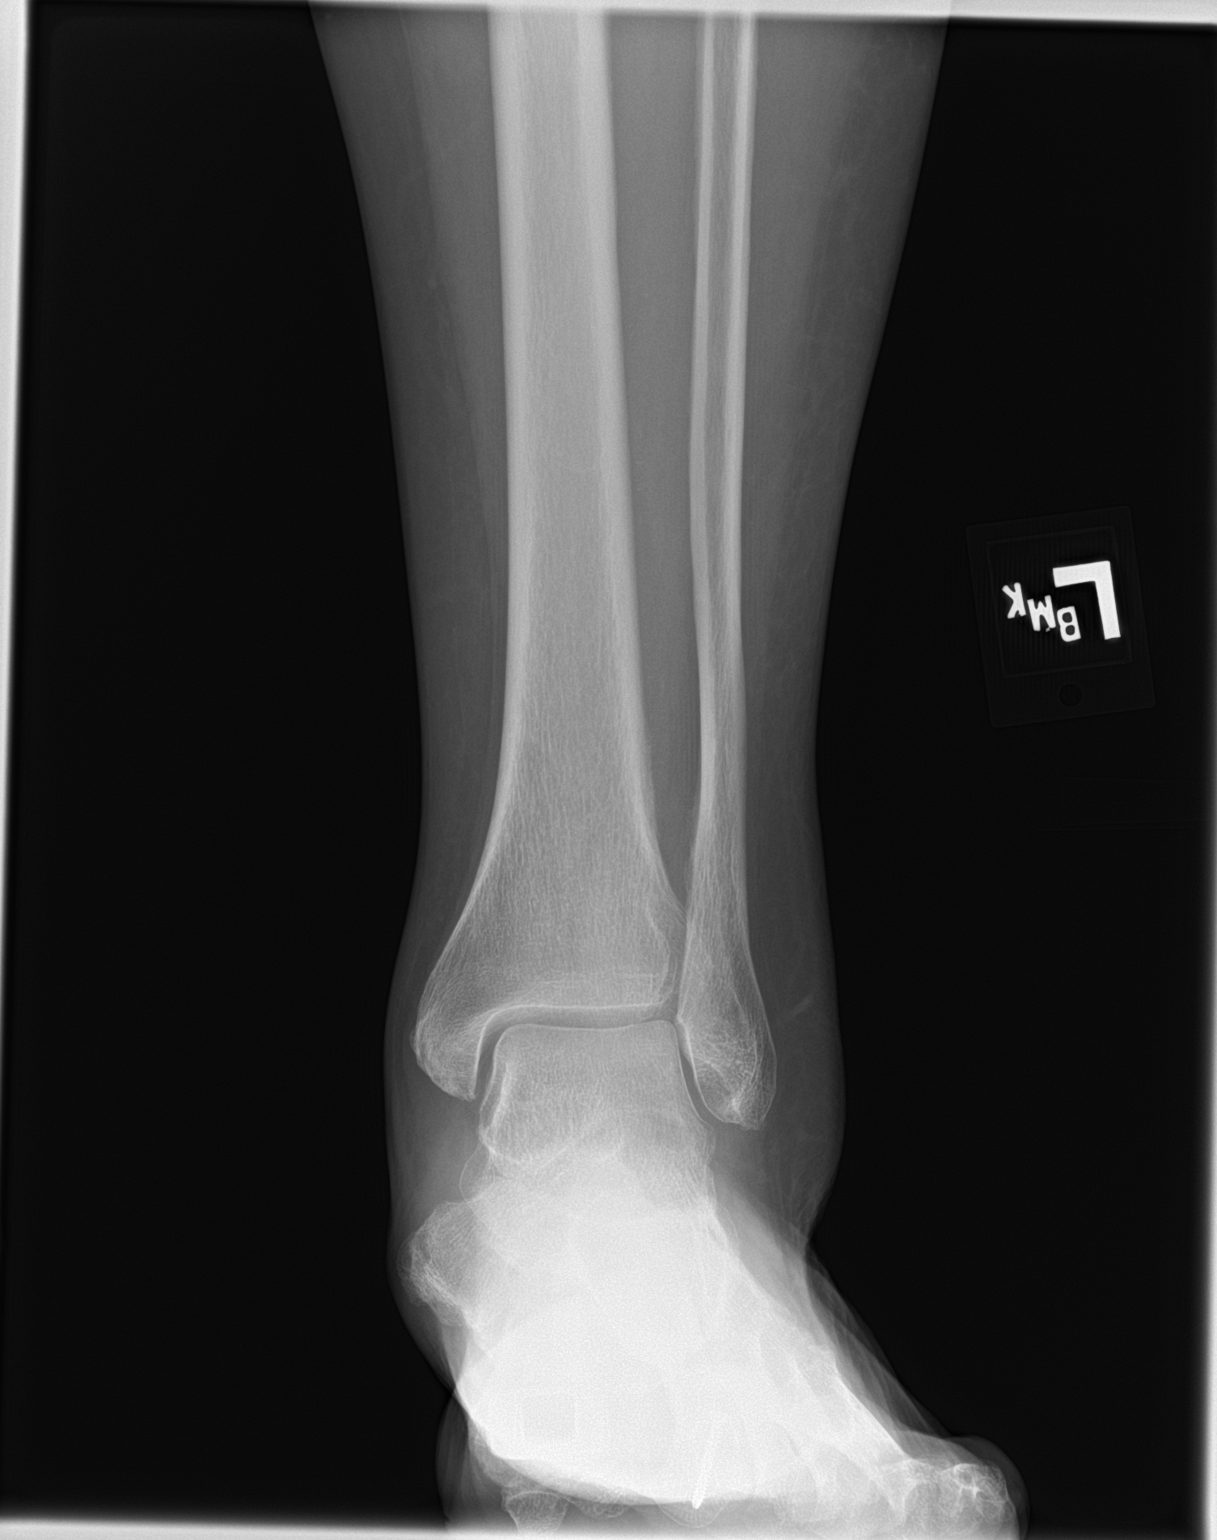

[ankle obl]
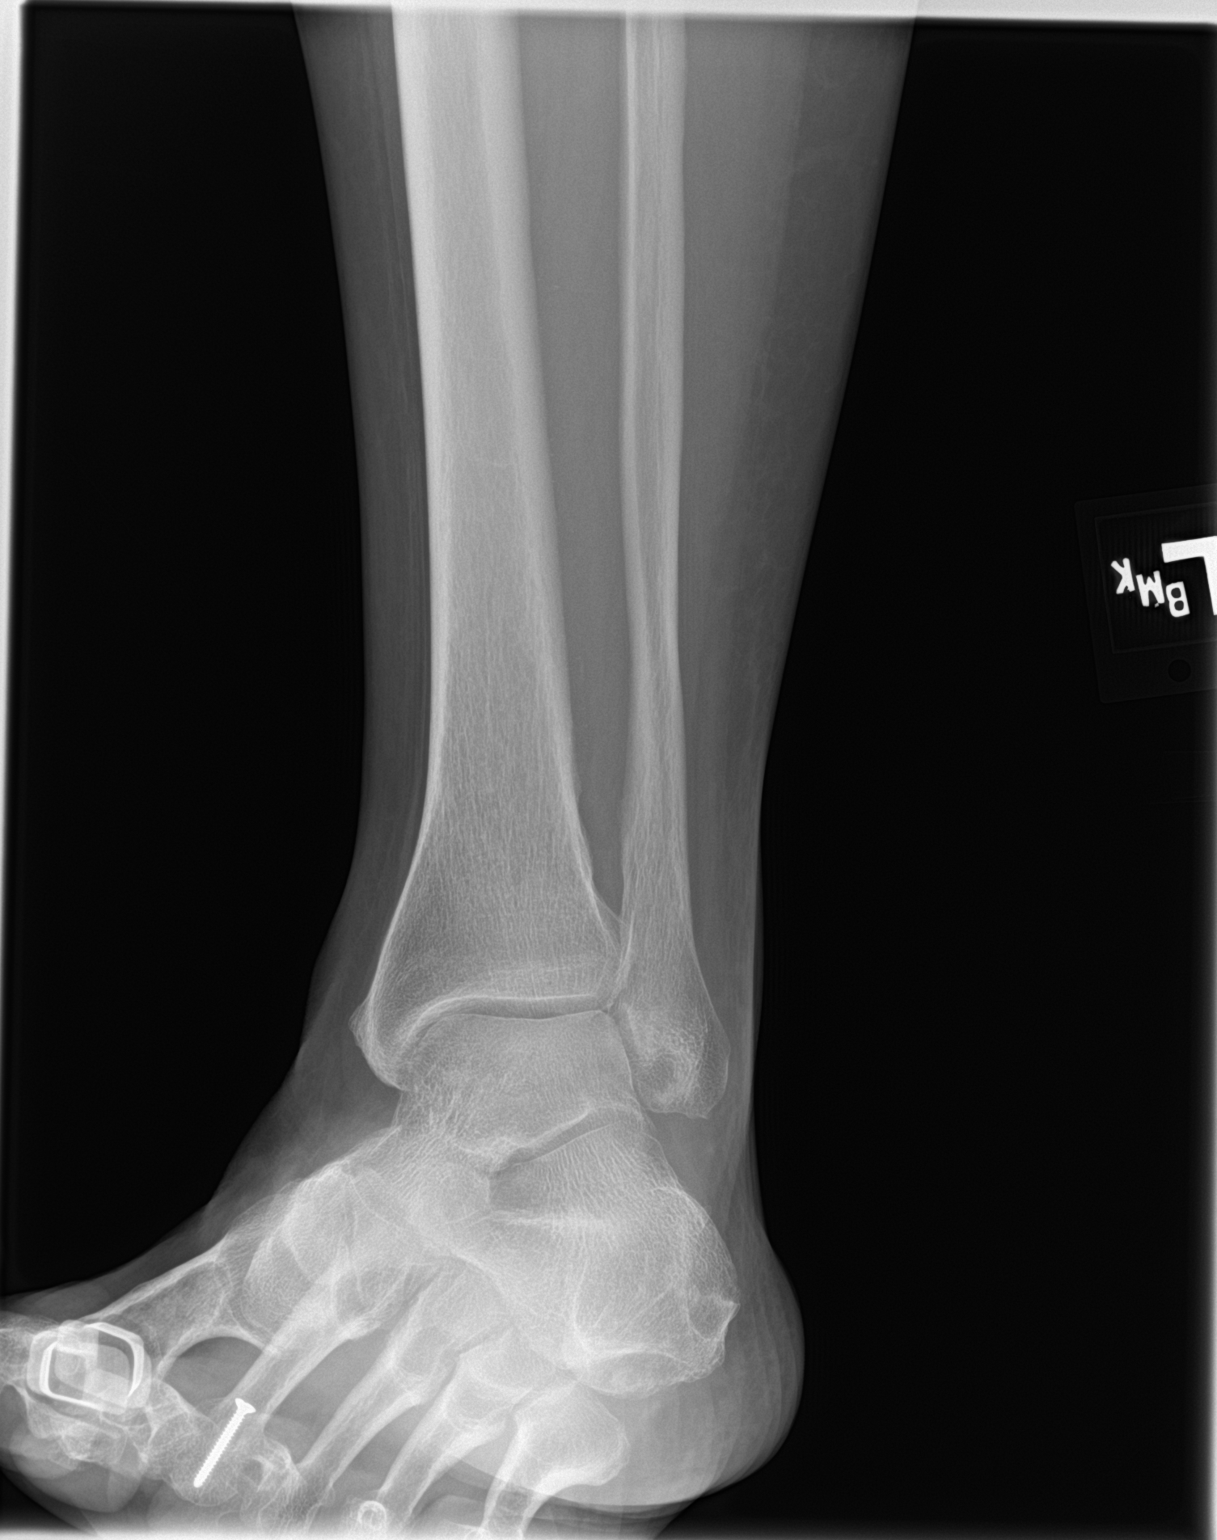

[ankle lat]
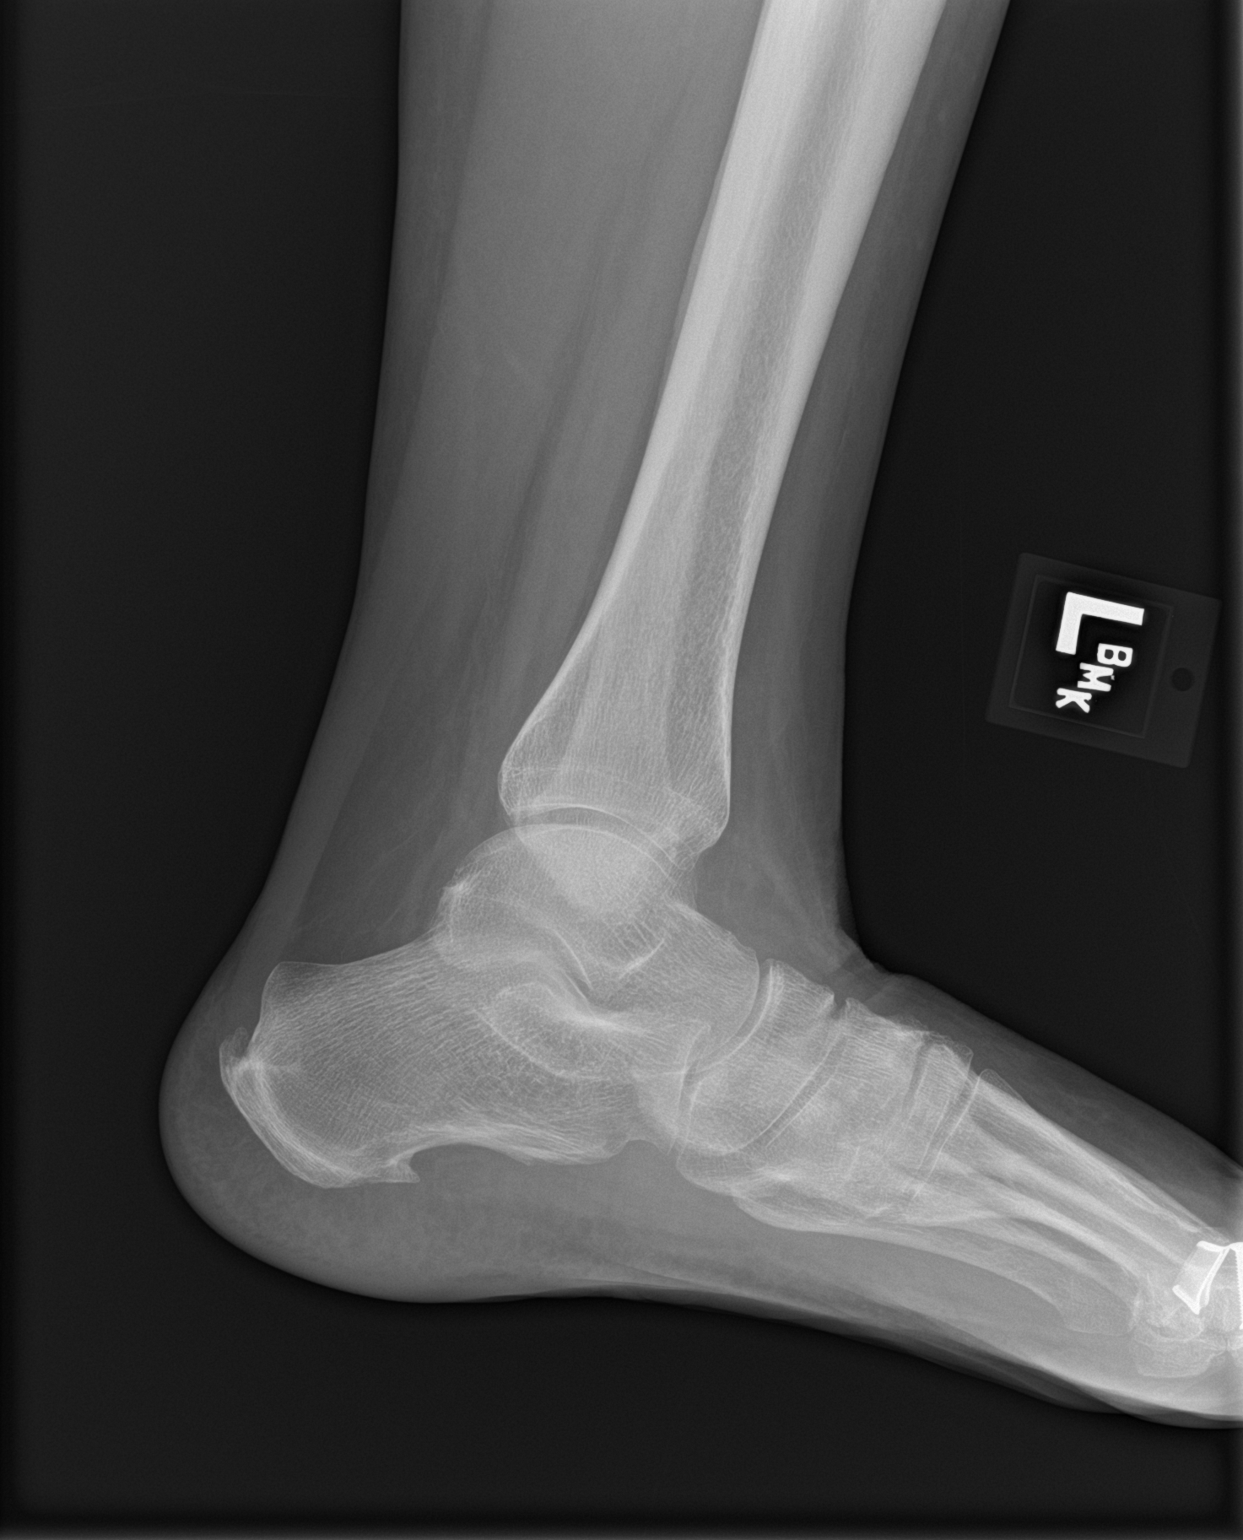

[3 of 3 positions shown; findings below may reference images not displayed]

FINDINGS: Soft tissue swelling about the ankle is most pronounced laterally.
No sizeable joint effusion. No soft tissue gas or foreign body. No
acute bony abnormality. Specifically, no fracture, subluxation, or
dislocation. Degenerative changes in the included ankle and
hindfoot. Bidirectional calcaneal spurs. No conspicuous osseous
lesions are radiographic features to suggest osteomyelitis.
IMPRESSION: Soft tissue swelling about the ankle is most pronounced laterally.
No soft tissue gas or foreign body. No acute osseous abnormality.

## 2023-03-15 IMAGING — US US EXTREM LOW VENOUS*L*
1 series · 14 of 24 positions shown · non-contrast
Comparison: None.

CLINICAL DATA: Left leg swelling

EXAM:
LEFT LOWER EXTREMITY VENOUS DOPPLER ULTRASOUND
TECHNIQUE: Gray-scale sonography with compression, as well as color and duplex
ultrasound, were performed to evaluate the deep venous system(s)
from the level of the common femoral vein through the popliteal and
proximal calf veins.

[Series 1: us venous img lower uni left (dvt) · portal-venous · 14 of 33 slices shown]
[im 1/33]
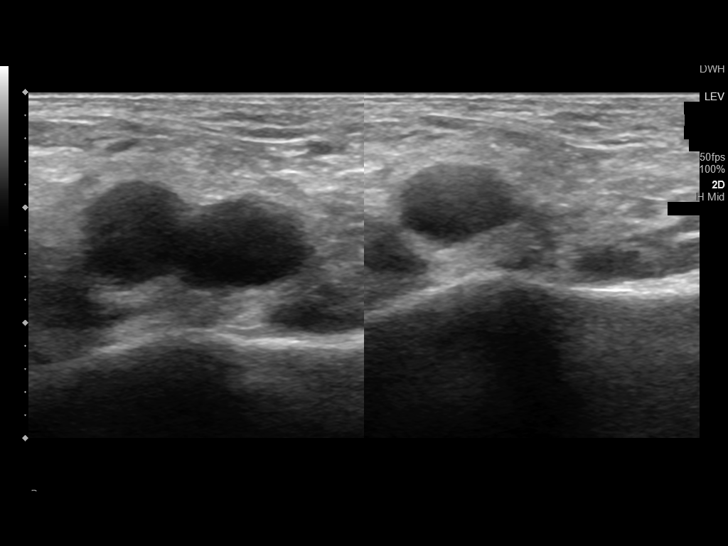
[im 3/33]
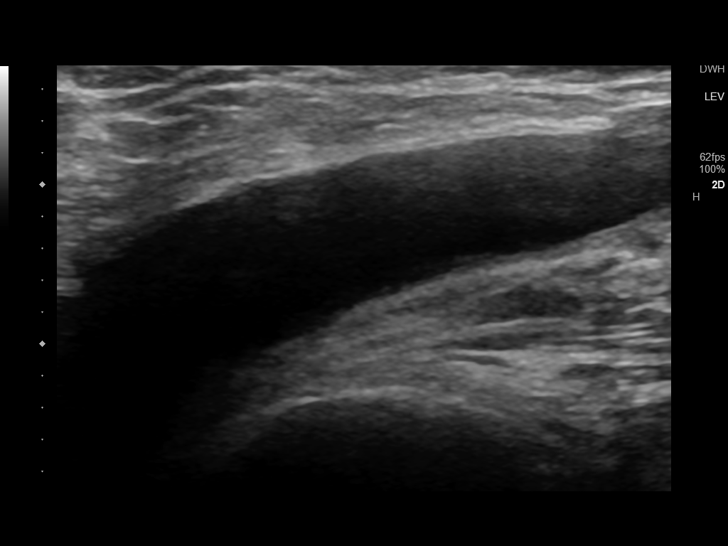
[im 6/33]
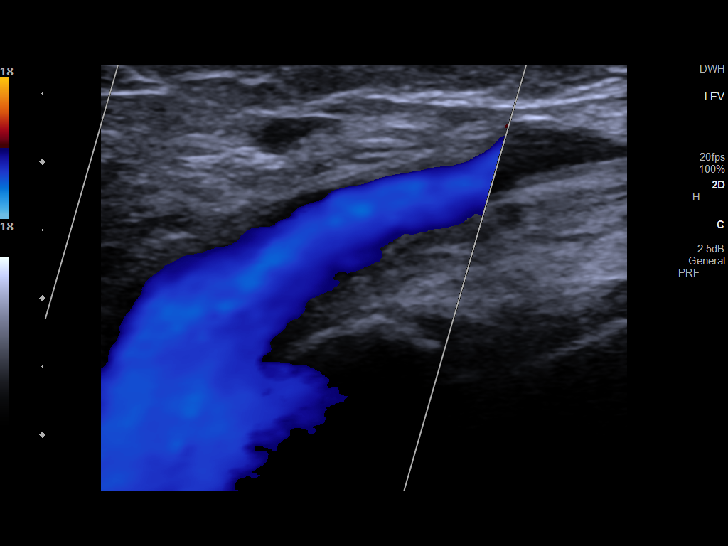
[im 9/33]
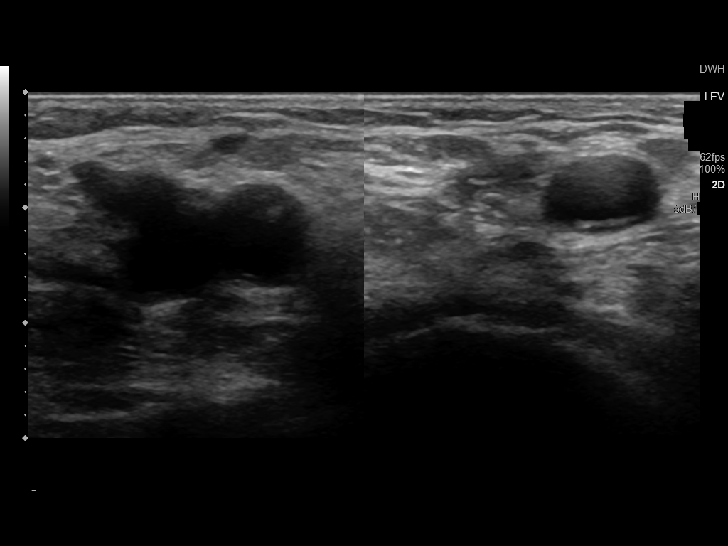
[im 10/33]
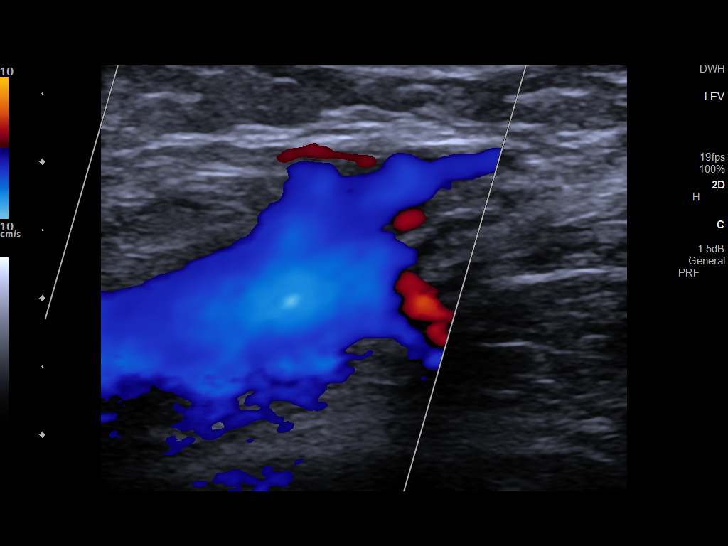
[im 13/33]
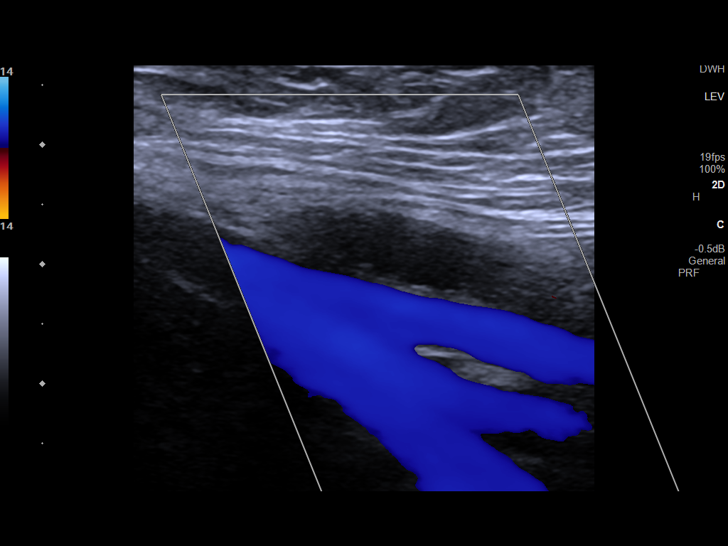
[im 16/33]
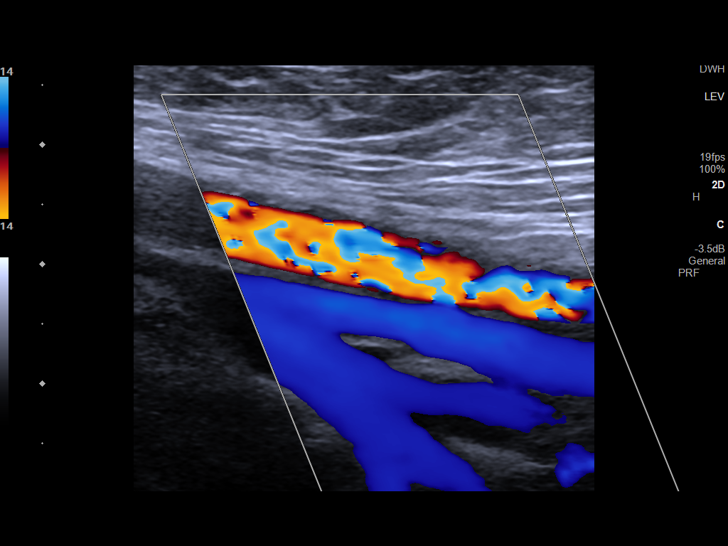
[im 17/33]
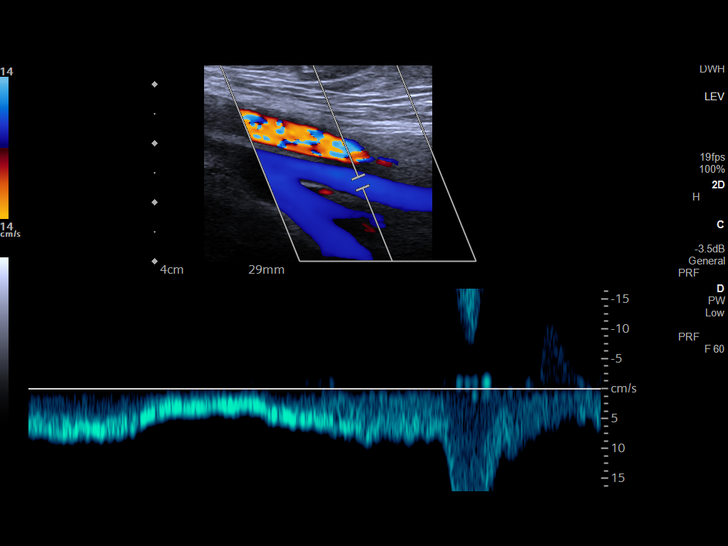
[im 20/33]
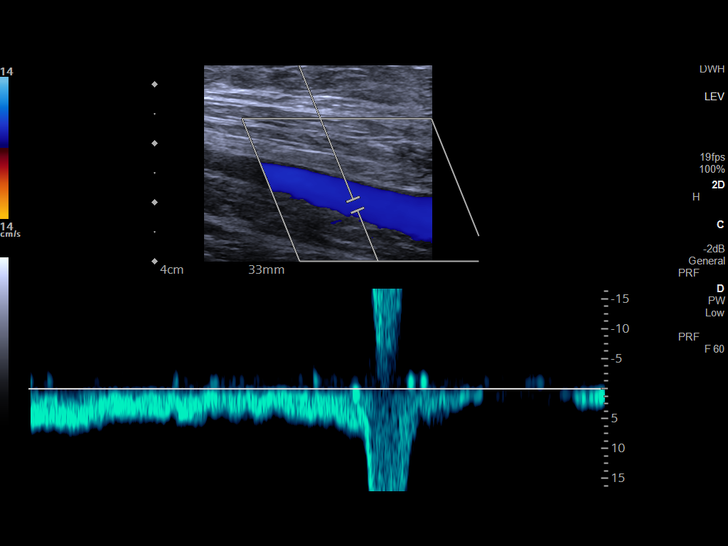
[im 23/33]
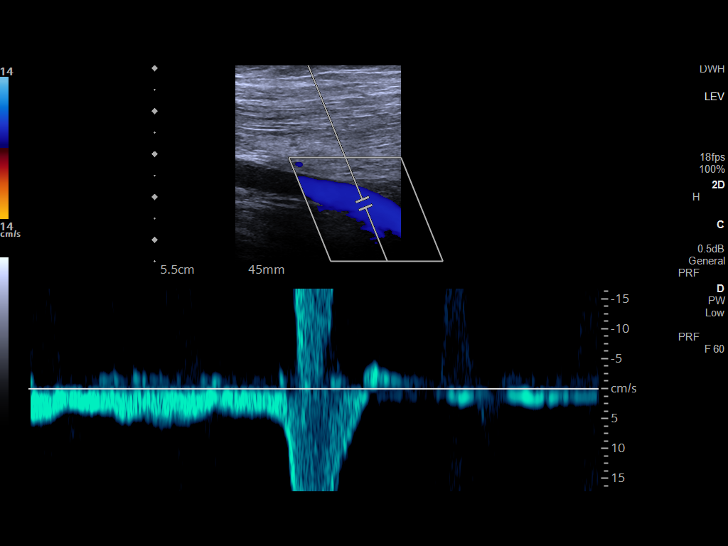
[im 26/33]
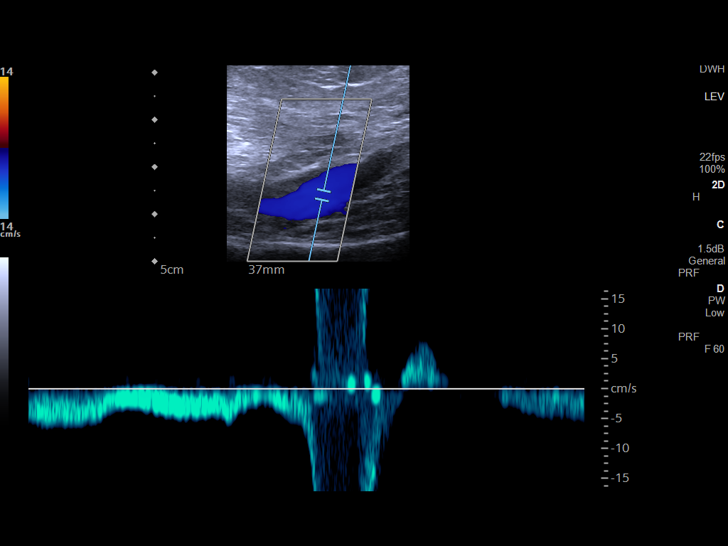
[im 27/33]
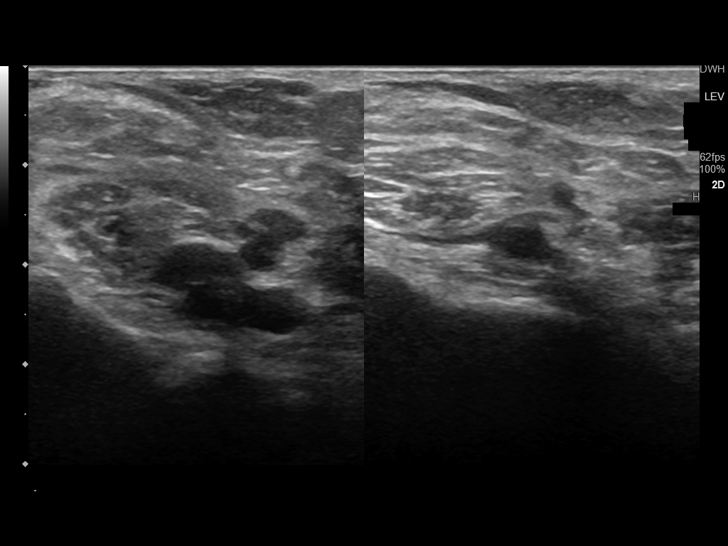
[im 30/33]
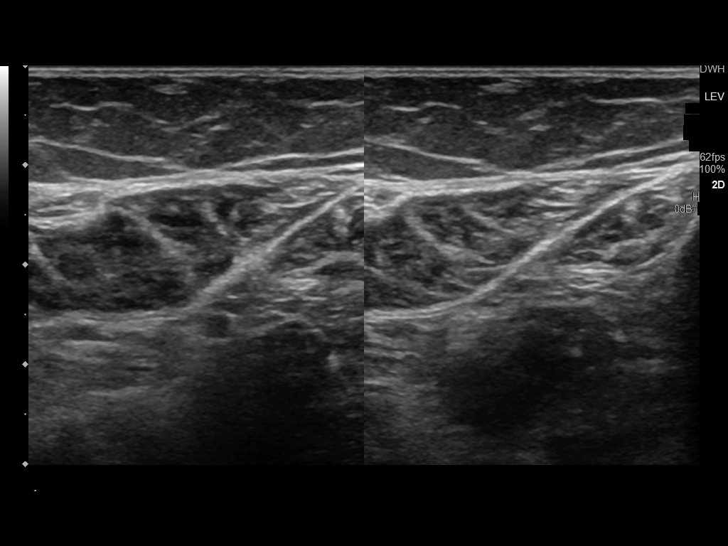
[im 33/33]
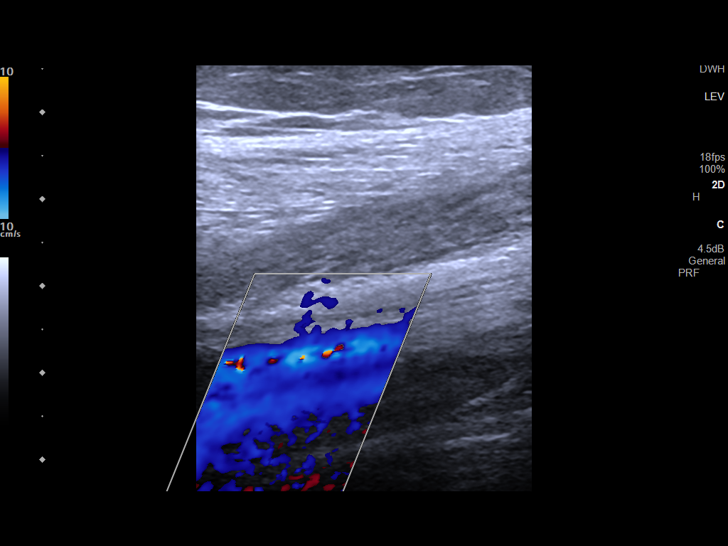

[14 of 24 positions shown; findings below may reference images not displayed]

FINDINGS: VENOUS

Normal compressibility of the common femoral, superficial femoral,
and popliteal veins, as well as the visualized calf veins.
Visualized portions of profunda femoral vein and great saphenous
vein unremarkable. No filling defects to suggest DVT on grayscale or
color Doppler imaging. Doppler waveforms show normal direction of
venous flow, normal respiratory plasticity and response to
augmentation.

Limited views of the contralateral common femoral vein are
unremarkable.

OTHER

None.

Limitations: none
IMPRESSION: Negative.

## 2023-03-15 MED ORDER — MIRTAZAPINE 30 MG PO TABS: ORAL_TABLET | ORAL | 1 refills | Status: DC

## 2023-03-20 ENCOUNTER — Ambulatory Visit (INDEPENDENT_AMBULATORY_CARE_PROVIDER_SITE_OTHER): Payer: 59 | Admitting: Podiatry

## 2023-03-20 ENCOUNTER — Ambulatory Visit: Payer: 59

## 2023-03-20 DIAGNOSIS — M216X2 Other acquired deformities of left foot: Secondary | ICD-10-CM

## 2023-03-20 DIAGNOSIS — M7750 Other enthesopathy of unspecified foot: Secondary | ICD-10-CM

## 2023-03-20 DIAGNOSIS — Q828 Other specified congenital malformations of skin: Secondary | ICD-10-CM

## 2023-03-20 NOTE — Patient Instructions (Signed)
Look for metatarsal pads on [place] made from felt, you can place these on the foot when walking just behind the callus  The shoes we discussed are Hoka, Altra, and Oofos (these would be good to wear in the house)

## 2023-03-25 NOTE — Progress Notes (Signed)
  Subjective:  Patient ID: Julie Acevedo, female    DOB: 10/25/1951,  MRN: [id number]  Chief Complaint  Patient presents with   Callouses    Right foot hurts with knot on bottom , Swelling - callus lesion 3rd submet    73 y.o. female presents with the above complaint. History confirmed with patient.   Objective:  Physical Exam: warm, good capillary refill, no trophic changes or ulcerative lesions, normal DP and PT pulses, normal sensory exam, and painful porokeratosis submetatarsal 3  Assessment:   1. Porokeratosis   2. Plantar flexed metatarsal bone of left foot      Plan:  Patient was evaluated and treated and all questions answered.  All symptomatic hyperkeratoses were safely debrided with a sterile #15 blade to patient's level of comfort without incident. We discussed preventative and palliative care of these lesions including supportive and accommodative shoegear, padding, prefabricated and custom molded accommodative orthoses, use of a pumice stone and lotions/creams daily.  Salicylic acid cream applied today as well as a metatarsal pad.  Recommend she utilize these at home   Return if symptoms worsen or fail to improve.

## 2023-03-27 ENCOUNTER — Ambulatory Visit: Payer: 59 | Admitting: Physician Assistant

## 2023-03-27 DIAGNOSIS — M19042 Primary osteoarthritis, left hand: Secondary | ICD-10-CM | POA: Diagnosis not present

## 2023-04-02 ENCOUNTER — Ambulatory Visit (INDEPENDENT_AMBULATORY_CARE_PROVIDER_SITE_OTHER): Payer: 59

## 2023-04-02 VITALS — Ht 63.0 in | Wt 137.0 lb

## 2023-04-02 DIAGNOSIS — Z0000 Encounter for general adult medical examination without abnormal findings: Secondary | ICD-10-CM

## 2023-04-02 NOTE — Patient Instructions (Signed)
Julie Acevedo , Thank you for taking time to come for your Medicare Wellness Visit. I appreciate your ongoing commitment to your health goals. Please review the following plan we discussed and let me know if I can assist you in the future.   These are the goals we discussed:  Goals       case management activities      Care Coordination Interventions: Case Management program offered and discussed SDOH screening completed Confirmed that patient would like assistance with Diabetes Management-reports recent discharge from the hospital due to low blood sugar Initial appointment with RNCM scheduled for 09/25/22 at 10:30am       DIET - EAT MORE FRUITS AND VEGETABLES      LIFESTYLE - DECREASE FALLS RISK      Recommend to remove any items from the home that may cause slips or trips.      Monitor and Manage My Blood Sugar-Diabetes Type 2      Timeframe:  Long-Range Goal Priority:  High Start Date:  02/27/2021                           Expected End Date: 08/30/2022                      Follow Up within 90 days   - check blood sugar at prescribed times - check blood sugar if I feel it is too high or too low    Why is this important?   Checking your blood sugar at home helps to keep it from getting very high or very low.  Writing the results in a diary or log helps the doctor know how to care for you.  Your blood sugar log should have the time, date and the results.  Also, write down the amount of insulin or other medicine that you take.  Other information, like what you ate, exercise done and how you were feeling, will also be helpful.     Notes:       Patient Stated (pt-stated)      Aging Gracefully Rn:   Patient states she would like to have decreased pain in back.  Assessment:  06/01/2021   Noted patient in severe back pain when sitting a walking.  Observed patient taking medications for pain. Observed patient having to stop walking until spasms stop.   Intervention:  reviewed with  patient the importance of taking all medications as prescribed. Reviewed importance of talking to MD about other pain control options.   Plan: next home visit on 06/28/2021  06/28/2021 Assessment:  Reassessed pain. Continues to have significant pain.  Interventions: reviewed home exercise plan. Demonstrated all exercises and encouraged patient to do home exercises daily..  Reviewed importance of continuing to take all her medications as prescribed.  Plan : next home visit planned for 07/26/2021 at 1030 am.  Wrote appointment on Exercise packet.   [name], RN, BSN, CEN [facility] Community Care Coordinator [phone]   07/24/2021 Assessment: No changes in back pain. Reports she is not able to do exercises.  Interventions:  Encourage patient to continue to stay active in any way possible. Reviewed importance of continuing to take her medications as prescribed.   Plan: next home visit planned for 09/04/2021.  [name], RN, BSN, CEN Aging Gracefully RN [phone]       Track and Manage My Blood Pressure-Hypertension      Timeframe:  Long-Range Goal Priority:  High Start Date: 06/25/2022                            Expected End Date: 06/26/2023                      Follow Up within 7 days   - check blood pressure daily    Why is this important?   You won't feel high blood pressure, but it can still hurt your blood vessels.  High blood pressure can cause heart or kidney problems. It can also cause a stroke.  Making lifestyle changes like losing a little weight or eating less salt will help.  Checking your blood pressure at home and at different times of the day can help to control blood pressure.  If the doctor prescribes medicine remember to take it the way the doctor ordered.  Call the office if you cannot afford the medicine or if there are questions about it.     Notes:         This is a list of the screening recommended for you and due dates:  Health Maintenance  Topic  Date Due   Eye exam for diabetics  06/13/2021   COVID-19 Vaccine (6 - 2023-24 season) 01/10/2023   Hemoglobin A1C  06/25/2023   Flu Shot  07/18/2023   Yearly kidney function blood test for diabetes  12/26/2023   Yearly kidney health urinalysis for diabetes  12/26/2023   Complete foot exam   12/26/2023   Medicare Annual Wellness Visit  04/01/2024   Mammogram  12/21/2024   DEXA scan (bone density measurement)  03/10/2025   DTaP/Tdap/Td vaccine (3 - Td or Tdap) 09/27/2026   Colon Cancer Screening  09/05/2028   Pneumonia Vaccine  Completed   Hepatitis C Screening: USPSTF Recommendation to screen - Ages 95-79 yo.  Completed   Zoster (Shingles) Vaccine  Completed   HPV Vaccine  Aged Out    Advanced directives: no  Conditions/risks identified: low falls risk  Next appointment: Follow up in one year for your annual wellness visit 04/06/2024@ 8:15am telephone   Preventive Care 65 Years and Older, Female Preventive care refers to lifestyle choices and visits with your health care provider that can promote health and wellness. What does preventive care include? A yearly physical exam. This is also called an annual well check. Dental exams once or twice a year. Routine eye exams. Ask your health care provider how often you should have your eyes checked. Personal lifestyle choices, including: Daily care of your teeth and gums. Regular physical activity. Eating a healthy diet. Avoiding tobacco and drug use. Limiting alcohol use. Practicing safe sex. Taking low-dose aspirin every day. Taking vitamin and mineral supplements as recommended by your health care provider. What happens during an annual well check? The services and screenings done by your health care provider during your annual well check will depend on your age, overall health, lifestyle risk factors, and family history of disease. Counseling  Your health care provider may ask you questions about your: Alcohol use. Tobacco  use. Drug use. Emotional well-being. Home and relationship well-being. Sexual activity. Eating habits. History of falls. Memory and ability to understand (cognition). Work and work Astronomer. Reproductive health. Screening  You may have the following tests or measurements: Height, weight, and BMI. Blood pressure. Lipid and cholesterol levels. These may be checked every 5 years, or more frequently if you are over 78 years old.  Skin check. Lung cancer screening. You may have this screening every year starting at age 33 if you have a 30-pack-year history of smoking and currently smoke or have quit within the past 15 years. Fecal occult blood test (FOBT) of the stool. You may have this test every year starting at age 44. Flexible sigmoidoscopy or colonoscopy. You may have a sigmoidoscopy every 5 years or a colonoscopy every 10 years starting at age 91. Hepatitis C blood test. Hepatitis B blood test. Sexually transmitted disease (STD) testing. Diabetes screening. This is done by checking your blood sugar (glucose) after you have not eaten for a while (fasting). You may have this done every 1-3 years. Bone density scan. This is done to screen for osteoporosis. You may have this done starting at age 28. Mammogram. This may be done every 1-2 years. Talk to your health care provider about how often you should have regular mammograms. Talk with your health care provider about your test results, treatment options, and if necessary, the need for more tests. Vaccines  Your health care provider may recommend certain vaccines, such as: Influenza vaccine. This is recommended every year. Tetanus, diphtheria, and acellular pertussis (Tdap, Td) vaccine. You may need a Td booster every 10 years. Zoster vaccine. You may need this after age 53. Pneumococcal 13-valent conjugate (PCV13) vaccine. One dose is recommended after age 62. Pneumococcal polysaccharide (PPSV23) vaccine. One dose is recommended  after age 61. Talk to your health care provider about which screenings and vaccines you need and how often you need them. This information is not intended to replace advice given to you by your health care provider. Make sure you discuss any questions you have with your health care provider. Document Released: 12/30/2015 Document Revised: 08/22/2016 Document Reviewed: 10/04/2015 Elsevier Interactive Patient Education  2017 [employer].  Fall Prevention in the Home Falls can cause injuries. They can happen to people of all ages. There are many things you can do to make your home safe and to help prevent falls. What can I do on the outside of my home? Regularly fix the edges of walkways and driveways and fix any cracks. Remove anything that might make you trip as you walk through a door, such as a raised step or threshold. Trim any bushes or trees on the path to your home. Use bright outdoor lighting. Clear any walking paths of anything that might make someone trip, such as rocks or tools. Regularly check to see if handrails are loose or broken. Make sure that both sides of any steps have handrails. Any raised decks and porches should have guardrails on the edges. Have any leaves, snow, or ice cleared regularly. Use sand or salt on walking paths during winter. Clean up any spills in your garage right away. This includes oil or grease spills. What can I do in the bathroom? Use night lights. Install grab bars by the toilet and in the tub and shower. Do not use towel bars as grab bars. Use non-skid mats or decals in the tub or shower. If you need to sit down in the shower, use a plastic, non-slip stool. Keep the floor dry. Clean up any water that spills on the floor as soon as it happens. Remove soap buildup in the tub or shower regularly. Attach bath mats securely with double-sided non-slip rug tape. Do not have throw rugs and other things on the floor that can make you trip. What can I do  in the bedroom? Use night lights.  Make sure that you have a light by your bed that is easy to reach. Do not use any sheets or blankets that are too big for your bed. They should not hang down onto the floor. Have a firm chair that has side arms. You can use this for support while you get dressed. Do not have throw rugs and other things on the floor that can make you trip. What can I do in the kitchen? Clean up any spills right away. Avoid walking on wet floors. Keep items that you use a lot in easy-to-reach places. If you need to reach something above you, use a strong step stool that has a grab bar. Keep electrical cords out of the way. Do not use floor polish or wax that makes floors slippery. If you must use wax, use non-skid floor wax. Do not have throw rugs and other things on the floor that can make you trip. What can I do with my stairs? Do not leave any items on the stairs. Make sure that there are handrails on both sides of the stairs and use them. Fix handrails that are broken or loose. Make sure that handrails are as long as the stairways. Check any carpeting to make sure that it is firmly attached to the stairs. Fix any carpet that is loose or worn. Avoid having throw rugs at the top or bottom of the stairs. If you do have throw rugs, attach them to the floor with carpet tape. Make sure that you have a light switch at the top of the stairs and the bottom of the stairs. If you do not have them, ask someone to add them for you. What else can I do to help prevent falls? Wear shoes that: Do not have high heels. Have rubber bottoms. Are comfortable and fit you well. Are closed at the toe. Do not wear sandals. If you use a stepladder: Make sure that it is fully opened. Do not climb a closed stepladder. Make sure that both sides of the stepladder are locked into place. Ask someone to hold it for you, if possible. Clearly mark and make sure that you can see: Any grab bars or  handrails. First and last steps. Where the edge of each step is. Use tools that help you move around (mobility aids) if they are needed. These include: Canes. Walkers. Scooters. Crutches. Turn on the lights when you go into a dark area. Replace any light bulbs as soon as they burn out. Set up your furniture so you have a clear path. Avoid moving your furniture around. If any of your floors are uneven, fix them. If there are any pets around you, be aware of where they are. Review your medicines with your doctor. Some medicines can make you feel dizzy. This can increase your chance of falling. Ask your doctor what other things that you can do to help prevent falls. This information is not intended to replace advice given to you by your health care provider. Make sure you discuss any questions you have with your health care provider. Document Released: 09/29/2009 Document Revised: 05/10/2016 Document Reviewed: 01/07/2015 Elsevier Interactive Patient Education  2017 [employer].

## 2023-04-02 NOTE — Progress Notes (Signed)
I connected with  [name] on 04/02/23 by a audio enabled telemedicine application and verified that I am speaking with the correct person using two identifiers.  Patient Location: Home  Provider Location: Office/Clinic  I discussed the limitations of evaluation and management by telemedicine. The patient expressed understanding and agreed to proceed.  Subjective:   Julie Acevedo is a 72 y.o. female who presents for Medicare Annual (Subsequent) preventive examination.  Review of Systems    Cardiac Risk Factors include: advanced age (>19men, >41 women);hypertension;diabetes mellitus;sedentary lifestyle    Objective:    Today's Vitals   04/02/23 0808 04/02/23 0811  Weight: 137 lb (62.1 kg)   Height:  (1.6 m)   PainSc:  7    Body mass index is 24.27 kg/m.     04/02/2023    8:23 AM 03/05/2023    2:35 PM 11/20/2022   12:00 PM 11/02/2022    2:30 PM 08/31/2022    5:23 AM 08/30/2022    8:41 PM 08/21/2022    3:49 AM  Advanced Directives  Does Patient Have a Medical Advance Directive? No No No No No No No  Type of Advance Directive     Healthcare Power of Attorney    Does patient want to make changes to medical advance directive?     No - Patient declined    Copy of Healthcare Power of Attorney in Chart?     No - copy requested    Would patient like information on creating a medical advance directive?  No - Patient declined No - Guardian declined        Current Medications (verified) Outpatient Encounter Medications as of 04/02/2023  Medication Sig   ACCU-CHEK GUIDE test strip USE TO CHECK BLOOD SUGAR UP TO 4 TIMES A DAY AS DIRECTED   Accu-Chek Softclix Lancets lancets SMARTSIG:Topical 1-4 Times Daily   blood glucose meter kit and supplies Dispense Accu-Chek Guide. Use up to four times daily as directed. 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   calcitRIOL (ROCALTROL) 0.25 MCG capsule Take 0.25 mcg by mouth daily.   gabapentin (NEURONTIN) 300 MG capsule Take 1 capsule (300 mg total)  by mouth 3 (three) times daily.   Glucagon (GVOKE HYPOPEN 2-PACK) 1 MG/0.2ML SOAJ Inject 0.2 mLs into the skin as needed.   hydrOXYzine (VISTARIL) 100 MG capsule Take 1 capsule (100 mg total) by mouth 3 (three) times daily.   levETIRAcetam (KEPPRA) 500 MG tablet Take 1 tablet (500 mg total) by mouth 2 (two) times daily.   linaclotide (LINZESS) 290 MCG CAPS capsule Take 1 capsule (290 mcg total) by mouth daily before breakfast. TAKE 1 CAPSULE BY MOUTH EVERY DAY BEFORE BREAKFAST   metoprolol succinate (TOPROL-XL) 25 MG 24 hr tablet TAKE 1 TABLET (25 MG TOTAL) BY MOUTH DAILY.   mirtazapine (REMERON) 30 MG tablet TAKE ONE TABLET BY MOUTH EVERYDAY AT BEDTIME   omeprazole (PRILOSEC) 20 MG capsule Take 1 capsule (20 mg total) by mouth daily.   ondansetron (ZOFRAN) 4 MG tablet TAKE 1 TABLET BY MOUTH EVERY 8 HOURS AS NEEDED (Patient taking differently: Take 4 mg by mouth every 8 (eight) hours as needed for nausea or vomiting.)   polyethylene glycol powder (MIRALAX) 17 GM/SCOOP powder Take 1 scoop or 17 g 3 times a day for 2 days then use 1 scoop twice a day a day until you begin having soft bowel movements then cut back to 1 scoop a day.   rivastigmine (EXELON) 3 MG capsule Take  3 mg by mouth 2 (two) times daily.   rosuvastatin (CRESTOR) 40 MG tablet Take 1 tablet (40 mg total) by mouth daily.   traMADol (ULTRAM) 50 MG tablet Take 1 tablet (50 mg total) by mouth every 6 (six) hours as needed for moderate pain or severe pain.   triamcinolone cream (KENALOG) 0.5 % Apply 1 Application topically 3 (three) times daily as needed (flares).   metFORMIN (GLUCOPHAGE) 500 MG tablet Take by mouth 2 (two) times daily with a meal. (Patient not taking: Reported on 04/02/2023)   No facility-administered encounter medications on file as of 04/02/2023.    Allergies (verified) Lotrel [amlodipine besy-benazepril hcl], Contrast media [iodinated contrast media], Niacin, and Sulfa antibiotics   History: Past Medical History:   Diagnosis Date   Anemia    Arthritis    COVID-19 11/19/2019   Diverticulitis    DM (diabetes mellitus)    typ e 2   History of methicillin resistant staphylococcus aureus (MRSA) 2017   HTN (hypertension)    Hyperlipidemia    Memory difficulties 09/01/2015   Multiple thyroid nodules    Myocardial infarction 1995   Short-term memory loss    Thyroid nodule    Past Surgical History:  Procedure Laterality Date   ABDOMINAL HYSTERECTOMY     due to endometriosis-1 ovary left   BREAST EXCISIONAL BIOPSY Right 1971   neg   CARDIAC CATHETERIZATION  2000   no significant CAD per note of Dr Graciela Husbands in 2013    CHOLECYSTECTOMY     CYSTOCELE REPAIR N/A 03/19/2017   Procedure: ANTERIOR REPAIR (CYSTOCELE);  Surgeon: Alfredo Martinez, MD;  Location: [facility];  Service: Urology;  Laterality: N/A;   CYSTOSCOPY N/A 03/19/2017   Procedure: CYSTOSCOPY;  Surgeon: Alfredo Martinez, MD;  Location: [facility];  Service: Urology;  Laterality: N/A;   ESOPHAGOGASTRODUODENOSCOPY (EGD) WITH PROPOFOL N/A 12/15/2021   Procedure: ESOPHAGOGASTRODUODENOSCOPY (EGD) WITH PROPOFOL;  Surgeon: Regis Bill, MD;  Location: [facility];  Service: Endoscopy;  Laterality: N/A;   EYE SURGERY     FOOT SURGERY     HAND SURGERY     HEMICOLECTOMY     INSERTION OF MESH N/A 08/21/2016   Procedure: INSERTION OF MESH;  Surgeon: Glenna Fellows, MD;  Location: [facility];  Service: General;  Laterality: N/A;   INSERTION OF MESH N/A 12/08/2019   Procedure: INSERTION OF MESH;  Surgeon: Leafy Ro, MD;  Location: [facility];  Service: General;  Laterality: N/A;   LAPAROSCOPIC LYSIS OF ADHESIONS N/A 08/21/2016   Procedure: LAPAROSCOPIC LYSIS OF ADHESIONS;  Surgeon: Glenna Fellows, MD;  Location: [facility];  Service: General;  Laterality: N/A;   LAPAROSCOPIC REMOVAL ABDOMINAL MASS     LAPAROTOMY N/A 04/16/2019   Procedure: EXPLORATORY LAPAROTOMY;  Surgeon: Sung Amabile, DO;  Location: [facility];  Service: General;  Laterality: N/A;    LOOP RECORDER INSERTION N/A 03/21/2018   Procedure: LOOP RECORDER INSERTION;  Surgeon: Thurmon Fair, MD;  Location: [facility];  Service: Cardiovascular;  Laterality: N/A;   LUMBAR DISC SURGERY     ROUX-EN-Y GASTRIC BYPASS  2010   Dr Daphine Deutscher   SMALL INTESTINE SURGERY     TONSILLECTOMY     UPPER GI ENDOSCOPY  01/12/2016   normal larynx, normal esophagus. gastric bypass with a normal-sized pouch and intact staple line, normal examined jejunum, otherwise exam was normal   VENTRAL HERNIA REPAIR N/A 08/21/2016   Procedure: LAPAROSCOPIC VENTRAL HERNIA;  Surgeon: Glenna Fellows, MD;  Location: [facility];  Service:  General;  Laterality: N/A;   XI ROBOTIC ASSISTED VENTRAL HERNIA N/A 12/08/2019   Procedure: XI ROBOTIC ASSISTED VENTRAL HERNIA;  Surgeon: Leafy Ro, MD;  Location: [facility];  Service: General;  Laterality: N/A;   Family History  Problem Relation Age of Onset   Cancer Father 9       Lung   Coronary artery disease Father 60   COPD Mother    Diabetes Mother    Hypertension Mother    Cancer Paternal Grandmother        Breast   Breast cancer Paternal Grandmother    Congestive Heart Failure Maternal Grandmother    Emphysema Maternal Grandfather    Heart attack Paternal Grandfather    Cancer Paternal Aunt        Breast   Breast cancer Paternal Aunt 58   Breast cancer Paternal Aunt 30   Social History   Socioeconomic History   Marital status: Married    Spouse name: Not on file   Number of children: 3   Years of education: 14   Highest education level: Some college, no degree  Occupational History   Occupation: Pharmacist, hospital: RETIRED  Tobacco Use   Smoking status: Never   Smokeless tobacco: Never  Vaping Use   Vaping Use: Never used  Substance and Sexual Activity   Alcohol use: No   Drug use: No   Sexual activity: Never  Other Topics Concern   Not on file  Social History Narrative   Lives with husband, daughter and granddaughter.;    Patient drinks about 3 cups of caffeine daily.   Patient is left handed.    Social Determinants of Health   Financial Resource Strain: Low Risk  (04/02/2023)   Overall Financial Resource Strain (CARDIA)    Difficulty of Paying Living Expenses: Not hard at all  Food Insecurity: No Food Insecurity (04/02/2023)   Hunger Vital Sign    Worried About Running Out of Food in the Last Year: Never true    Ran Out of Food in the Last Year: Never true  Transportation Needs: No Transportation Needs (04/02/2023)   PRAPARE - Administrator, Civil Service (Medical): No    Lack of Transportation (Non-Medical): No  Physical Activity: Inactive (04/02/2023)   Exercise Vital Sign    Days of Exercise per Week: 0 days    Minutes of Exercise per Session: 0 min  Stress: No Stress Concern Present (04/02/2023)   [employer] of Occupational Health - Occupational Stress Questionnaire    Feeling of Stress : Not at all  Social Connections: Moderately Integrated (04/02/2023)   Social Connection and Isolation Panel [NHANES]    Frequency of Communication with Friends and Family: More than three times a week    Frequency of Social Gatherings with Friends and Family: Once a week    Attends Religious Services: 1 to 4 times per year    Active Member of [employer] or Organizations: No    Attends Engineer, structural: Never    Marital Status: Married    Tobacco Counseling Counseling given: Not Answered   Clinical Intake:  Pre-visit preparation completed: Yes  Pain : 0-10 Pain Score: 7  Pain Type: Chronic pain Pain Location: Back Pain Orientation: Lower Pain Descriptors / Indicators: Aching Pain Onset: More than a month ago Pain Frequency: Constant Pain Relieving Factors: tramadol  Pain Relieving Factors: tramadol  BMI - recorded: 24.27 Nutritional Status: BMI of 19-24  Normal Nutritional Risks:  Other (Comment) Diabetes: Yes CBG done?: No Did pt. bring in CBG monitor from home?:  No  How often do you need to have someone help you when you read instructions, pamphlets, or other written materials from your doctor or pharmacy?: 1 - Never  Diabetic?yes  Interpreter Needed?: No Comments: live with husband Information entered by :: B.Ervin Hensley,LPN   Activities of Daily Living    04/02/2023    8:25 AM 01/22/2023    1:12 PM  In your present state of health, do you have any difficulty performing the following activities:  Hearing? 0 0  Vision? 1 1  Difficulty concentrating or making decisions? 1 0  Comment problems concentrating;forgets things   Walking or climbing stairs? 1 1  Comment back to pain   Dressing or bathing? 0 0  Doing errands, shopping? 0 0  Preparing Food and eating ? N   Using the Toilet? N   In the past six months, have you accidently leaked urine? N   Do you have problems with loss of bowel control? N   Managing your Medications? N   Managing your Finances? N   Housekeeping or managing your Housekeeping? N     Patient Care Team: [name], PA-C as PCP - General (Physician Assistant) Alwyn Pea, MD as PCP - Cardiology (Cardiology) Irene Limbo., MD as Consulting Physician (Ophthalmology) Alfredo Martinez, MD as Consulting Physician (Urology) Leafy Ro, MD as Consulting Physician (General Surgery) Helane Gunther, DPM as Consulting Physician (Podiatry) Nadara Mustard, MD as Referring Physician (Obstetrics and Gynecology) Lonell Face, MD as Consulting Physician (Neurology) Lysle Rubens, OT as Occupational Therapist (Occupational Therapy) Jeralyn Ruths, MD as Consulting Physician (Oncology) Marlowe Sax, RN as Case Manager (General Practice)  Indicate any recent Medical Services you may have received from other than [facility] providers in the past year (date may be approximate).     Assessment:   This is a routine wellness examination for [place].  Hearing/Vision screen Hearing Screening - Comments::  Adequate hearing Vision Screening - Comments:: Adequate vision w/glasses; has f/u next month for bulging eyes Dr Pershing Cox  Dietary issues and exercise activities discussed: Current Exercise Habits: The patient does not participate in regular exercise at present, Exercise limited by: orthopedic condition(s)   Goals Addressed               This Visit's Progress     DIET - EAT MORE FRUITS AND VEGETABLES   On track     LIFESTYLE - DECREASE FALLS RISK   On track     Recommend to remove any items from the home that may cause slips or trips.      Patient Stated (pt-stated)   Not on track     Aging Gracefully Rn:   Patient states she would like to have decreased pain in back.  Assessment:  06/01/2021   Noted patient in severe back pain when sitting a walking.  Observed patient taking medications for pain. Observed patient having to stop walking until spasms stop.   Intervention:  reviewed with patient the importance of taking all medications as prescribed. Reviewed importance of talking to MD about other pain control options.   Plan: next home visit on 06/28/2021  06/28/2021 Assessment:  Reassessed pain. Continues to have significant pain.  Interventions: reviewed home exercise plan. Demonstrated all exercises and encouraged patient to do home exercises daily..  Reviewed importance of continuing to take all her medications as prescribed.  Plan :  next home visit planned for 07/26/2021 at 1030 am.  Wrote appointment on Exercise packet.   [name], RN, BSN, CEN [facility] Community Care Coordinator [phone]   07/24/2021 Assessment: No changes in back pain. Reports she is not able to do exercises.  Interventions:  Encourage patient to continue to stay active in any way possible. Reviewed importance of continuing to take her medications as prescribed.   Plan: next home visit planned for 09/04/2021.  [name], RN, BSN, CEN Aging Gracefully RN [phone]       Track and  Manage My Blood Pressure-Hypertension   On track     Timeframe:  Long-Range Goal Priority:  High Start Date: 06/25/2022                            Expected End Date: 06/26/2023                      Follow Up within 7 days   - check blood pressure daily    Why is this important?   You won't feel high blood pressure, but it can still hurt your blood vessels.  High blood pressure can cause heart or kidney problems. It can also cause a stroke.  Making lifestyle changes like losing a little weight or eating less salt will help.  Checking your blood pressure at home and at different times of the day can help to control blood pressure.  If the doctor prescribes medicine remember to take it the way the doctor ordered.  Call the office if you cannot afford the medicine or if there are questions about it.     Notes:        Depression Screen    04/02/2023    8:18 AM 01/22/2023    1:11 PM 12/25/2022   10:23 AM 09/20/2022    3:37 PM 04/24/2022    1:17 PM 03/28/2022    8:30 AM 12/26/2021   11:13 AM  PHQ 2/9 Scores  PHQ - 2 Score 0 0 0 0 0 0 3  PHQ- 9 Score  0 0    6    Fall Risk    04/02/2023    8:15 AM 01/22/2023    1:11 PM 12/25/2022   10:23 AM 03/28/2022    8:32 AM 12/12/2021   11:33 AM  Fall Risk   Falls in the past year? 0 0 0 1 1  Number falls in past yr: 0 0 0 1 1  Injury with Fall? 0 0 0 0 1  Risk for fall due to : No Fall Risks No Fall Risks Other (Comment) History of fall(s) History of fall(s);Impaired balance/gait;Medication side effect  Follow up Education provided;Falls prevention discussed  Falls evaluation completed Falls prevention discussed Falls prevention discussed    FALL RISK PREVENTION PERTAINING TO THE HOME:  Any stairs in or around the home? Yes  If so, are there any without handrails? Yes  Home free of loose throw rugs in walkways, pet beds, electrical cords, etc? Yes  Adequate lighting in your home to reduce risk of falls? Yes   ASSISTIVE DEVICES UTILIZED TO  PREVENT FALLS:  Life alert? Yes  Use of a cane, walker or w/c? No  Grab bars in the bathroom? Yes  Shower chair or bench in shower? Yes  Elevated toilet seat or a handicapped toilet? Yes   Cognitive Function:    11/30/2020    3:46 PM 09/01/2015  9:49 AM  MMSE - Mini Mental State Exam  Orientation to time 4 4  Orientation to Place 5 4  Registration 3 2  Attention/ Calculation 5 5  Recall 2 3  Language- name 2 objects 2 2  Language- repeat 1 0  Language- follow 3 step command 3 2  Language- read & follow direction 1 1  Write a sentence 1 1  Copy design 1 1  Total score 28 25        04/02/2023    8:31 AM 11/30/2020    3:36 PM 02/08/2020    9:04 AM 12/24/2016   11:24 AM  6CIT Screen  What Year? 0 points 0 points 0 points 0 points  What month? 0 points 0 points 0 points 0 points  What time? 0 points 0 points 0 points 0 points  Count back from 20 0 points 0 points 0 points 0 points  Months in reverse 0 points 0 points 4 points 0 points  Repeat phrase 0 points 6 points 4 points 4 points  Total Score 0 points 6 points 8 points 4 points    Immunizations Immunization History  Administered Date(s) Administered   Fluad Quad(high Dose 65+) 08/15/2019, 09/01/2020, 09/13/2021, 11/15/2022   Influenza Split 10/17/2010, 10/02/2011, 08/27/2012   Influenza, High Dose Seasonal PF 08/29/2018   Influenza,inj,Quad PF,6+ Mos 09/26/2013, 09/06/2014, 09/06/2016   Influenza,inj,Quad PF,6-35 Mos 08/31/2017   Influenza-Unspecified 09/15/2015, 08/31/2017   PFIZER Comirnaty(Gray Top)Covid-19 Tri-Sucrose Vaccine 04/05/2021, 11/15/2022   PFIZER(Purple Top)SARS-COV-2 Vaccination 01/29/2020, 02/23/2020, 10/11/2020   PNEUMOCOCCAL CONJUGATE-20 06/24/2021   Pneumococcal Conjugate-13 08/29/2018   Pneumococcal Polysaccharide-23 11/17/2012   Tdap 10/02/2011, 09/27/2016   Zoster Recombinat (Shingrix) 08/29/2018, 08/15/2019   Zoster, Live 09/06/2016    TDAP status: Up to date  Flu Vaccine status:  Up to date  Pneumococcal vaccine status: Up to date  Covid-19 vaccine status: Completed vaccines  Qualifies for Shingles Vaccine? Yes   Zostavax completed Yes   Shingrix Completed?: Yes  Screening Tests Health Maintenance  Topic Date Due   OPHTHALMOLOGY EXAM  06/13/2021   COVID-19 Vaccine (6 - 2023-24 season) 01/10/2023   HEMOGLOBIN A1C  06/25/2023   INFLUENZA VACCINE  07/18/2023   Diabetic kidney evaluation - eGFR measurement  12/26/2023   Diabetic kidney evaluation - Urine ACR  12/26/2023   FOOT EXAM  12/26/2023   Medicare Annual Wellness (AWV)  04/01/2024   MAMMOGRAM  12/21/2024   DEXA SCAN  03/10/2025   DTaP/Tdap/Td (3 - Td or Tdap) 09/27/2026   COLONOSCOPY (Pts 45-19yrs Insurance coverage will need to be confirmed)  09/05/2028   Pneumonia Vaccine 78+ Years old  Completed   Hepatitis C Screening  Completed   Zoster Vaccines- Shingrix  Completed   HPV VACCINES  Aged Out    Health Maintenance  Health Maintenance Due  Topic Date Due   OPHTHALMOLOGY EXAM  06/13/2021   COVID-19 Vaccine (6 - 2023-24 season) 01/10/2023    Colorectal cancer screening: Type of screening: Colonoscopy. Completed yes. Repeat every 10 years  Mammogram status: Completed yes. Repeat every year  Bone Density status: Completed yes. Results reflect: Bone density results: NORMAL. Repeat every 5 years.  Lung Cancer Screening: (Low Dose CT Chest recommended if Age 18-80 years, 30 pack-year currently smoking OR have quit w/in 15years.) does not qualify.   Lung Cancer Screening Referral: no  Additional Screening:  Hepatitis C Screening: does not qualify; Completed yes  Vision Screening: Recommended annual ophthalmology exams for early detection of glaucoma and other disorders  of the eye. Is the patient up to date with their annual eye exam?  Yes  Who is the provider or what is the name of the office in which the patient attends annual eye exams? [facility] If pt is not established with a  provider, would they like to be referred to a provider to establish care? No .   Dental Screening: Recommended annual dental exams for proper oral hygiene  Community Resource Referral / Chronic Care Management: CRR required this visit?  No   CCM required this visit?  No     Plan:     I have personally reviewed and noted the following in the patient's chart:   Medical and social history Use of alcohol, tobacco or illicit drugs  Current medications and supplements including opioid prescriptions. Patient is not currently taking opioid prescriptions. Functional ability and status Nutritional status Physical activity Advanced directives List of other physicians Hospitalizations, surgeries, and ER visits in previous 12 months Vitals Screenings to include cognitive, depression, and falls Referrals and appointments  In addition, I have reviewed and discussed with patient certain preventive protocols, quality metrics, and best practice recommendations. A written personalized care plan for preventive services as well as general preventive health recommendations were provided to patient.    Sue Lush, LPN   [id number]/82/9562   Nurse Notes: pt states she is maintaining at her baseline of dealing with chronic back pain everyday. She relays she takes Tramadol in am and pm to control pain. She has no questions or concerns at this time

## 2023-04-08 ENCOUNTER — Encounter: Payer: Self-pay | Admitting: Oncology

## 2023-04-08 ENCOUNTER — Other Ambulatory Visit: Payer: Self-pay | Admitting: Physician Assistant

## 2023-04-08 DIAGNOSIS — M5417 Radiculopathy, lumbosacral region: Secondary | ICD-10-CM

## 2023-04-08 NOTE — Telephone Encounter (Signed)
Medication Refill - Medication: traMADol (ULTRAM) 50 MG tablet  Pt is out of medication.   Has the patient contacted their pharmacy? Yes.   Pharmacy advised pt they sent over the refill request last week.   Preferred Pharmacy (with phone number or street name):  [id number] [place] [address]  Phone: [phone] Fax: [phone]  Has the patient been seen for an appointment in the last year OR does the patient have an upcoming appointment? Yes.    Agent: Please be advised that RX refills may take up to 3 business days. We ask that you follow-up with your pharmacy.

## 2023-04-09 IMAGING — CT CT ABD-PELV W/O CM
2 of 4 series · 15 of 46 positions shown, 17 images · non-contrast
Comparison: 04/12/2020

CLINICAL DATA: Abdominal pain for 1 day.  Nausea.

EXAM:
CT ABDOMEN AND PELVIS WITHOUT CONTRAST
TECHNIQUE: Multidetector CT imaging of the abdomen and pelvis was performed
following the standard protocol without IV contrast.

[Series 2: axial st · axial · 0.65mm/px · z∈[-1018,-633]mm · 12 of 85 slices shown, 14 images]
[im 4/85  soft-tissue]
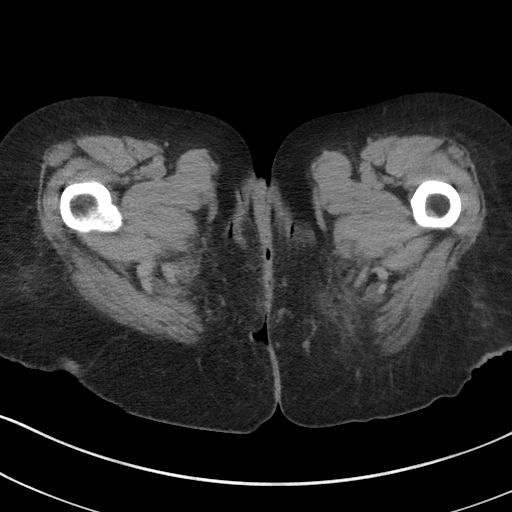
[im 4/85  bone]
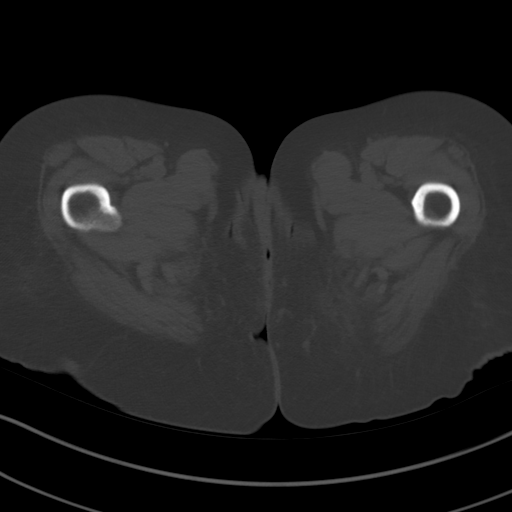
[im 11/85  soft-tissue]
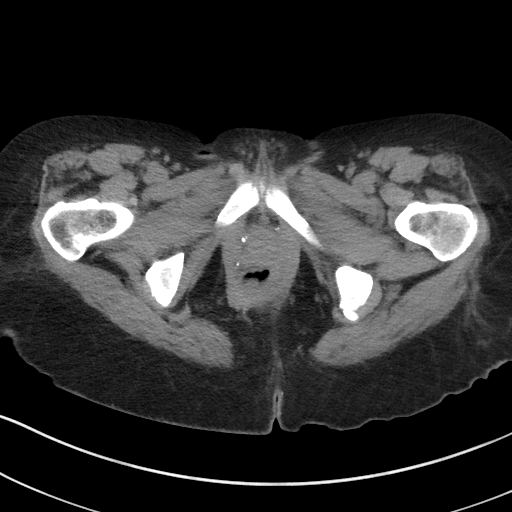
[im 18/85  soft-tissue]
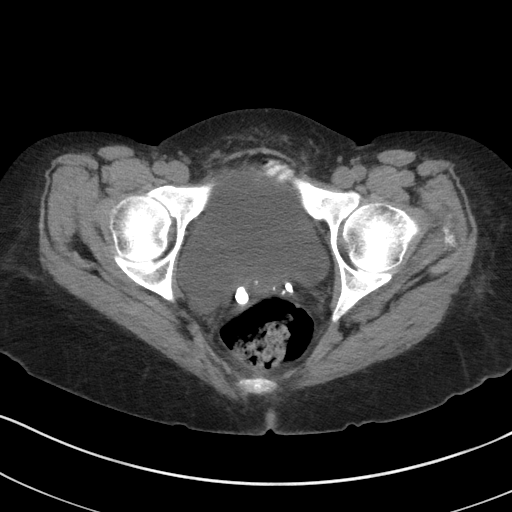
[im 25/85  soft-tissue]
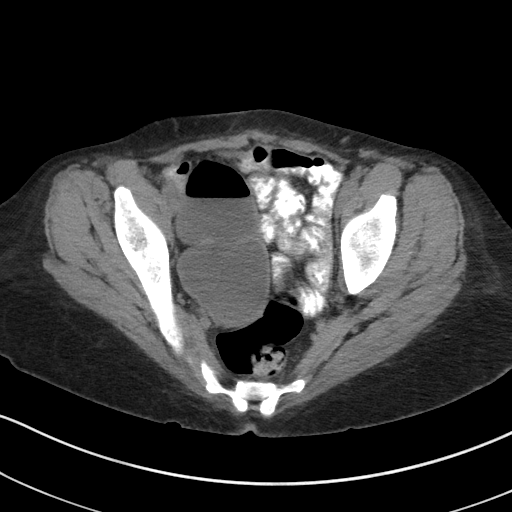
[im 32/85  soft-tissue]
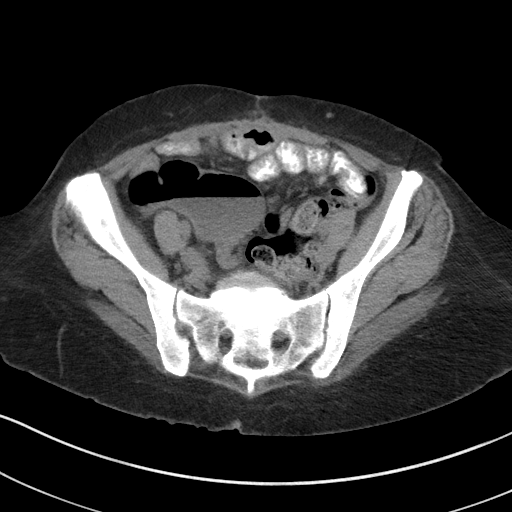
[im 39/85  soft-tissue]
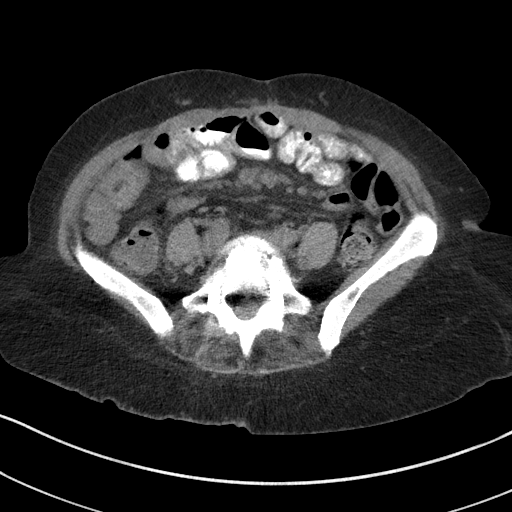
[im 46/85  soft-tissue]
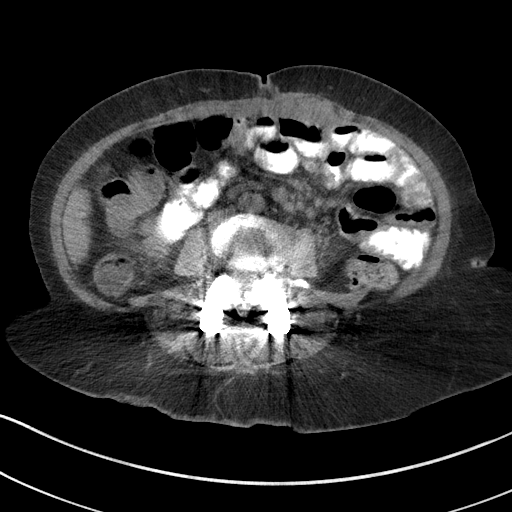
[im 53/85  soft-tissue]
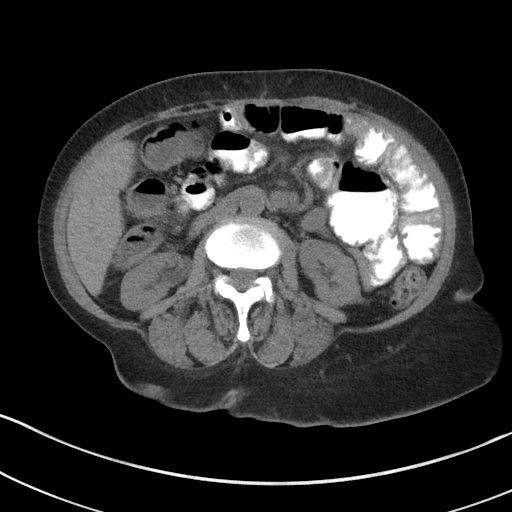
[im 60/85  soft-tissue]
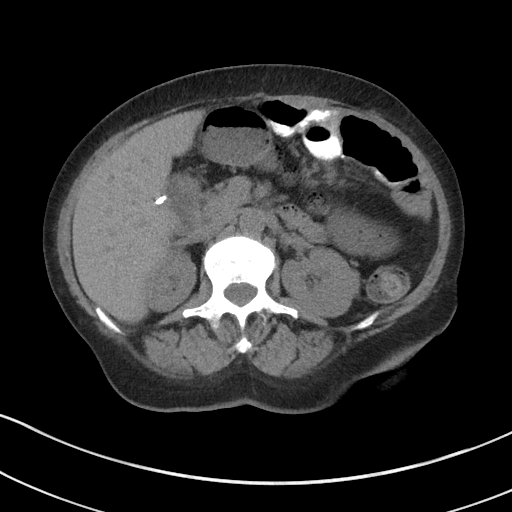
[im 60/85  bone]
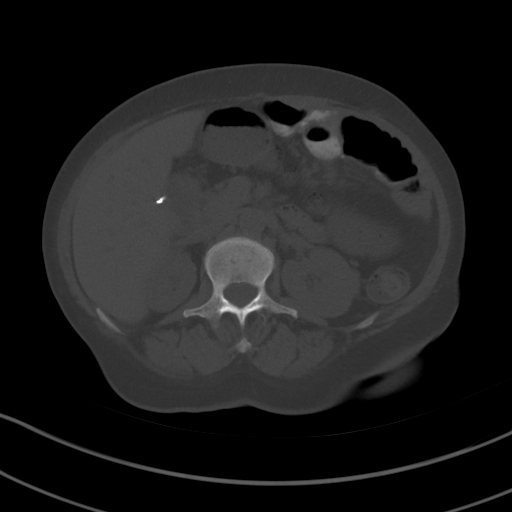
[im 67/85  soft-tissue]
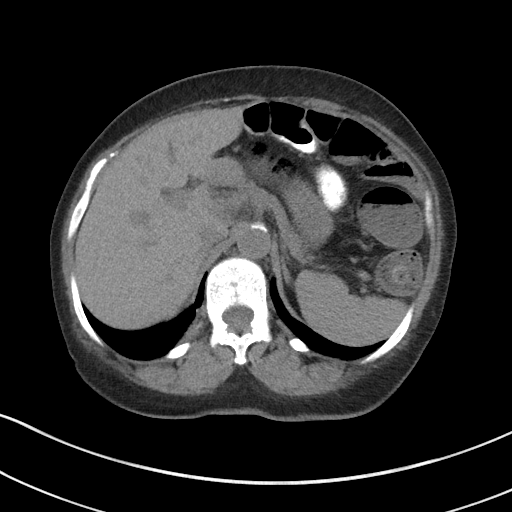
[im 74/85  soft-tissue]
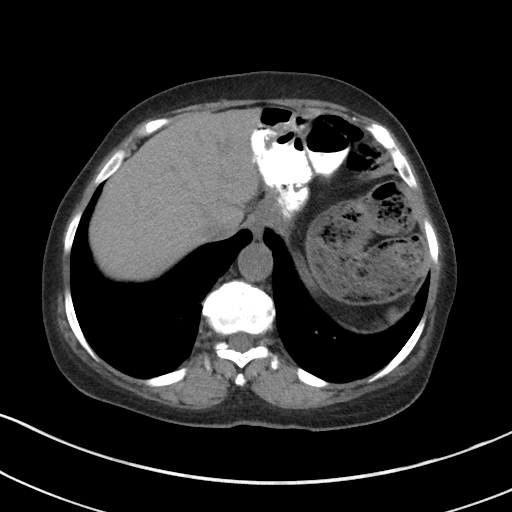
[im 81/85  soft-tissue]
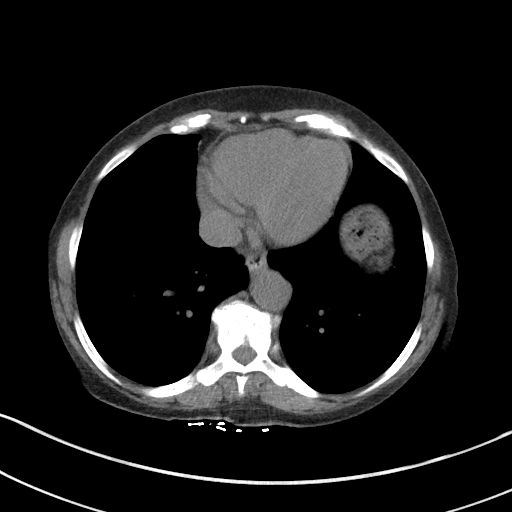

[Series 5: coronal st · coronal · 0.61mm/px · 3 of 77 slices shown]
[im 26/77  soft-tissue]
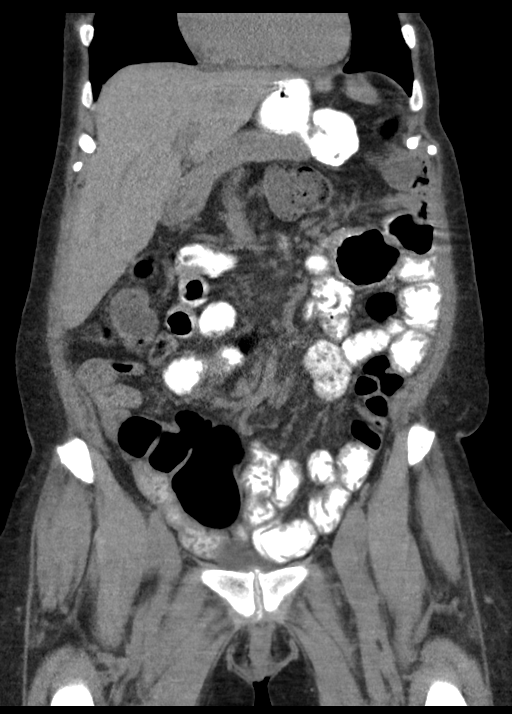
[im 34/77  soft-tissue]
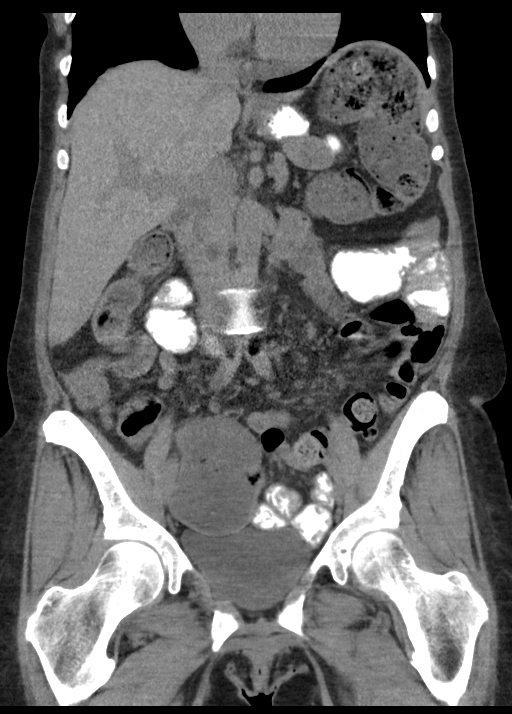
[im 43/77  soft-tissue]
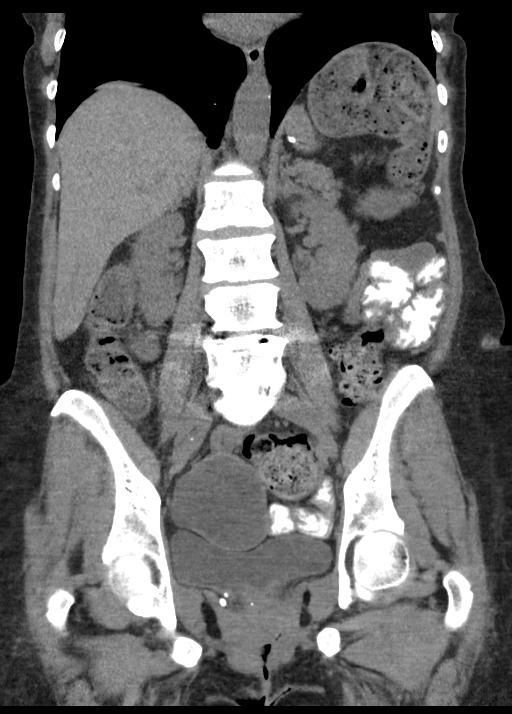

[15 of 46 positions shown; findings below may reference images not displayed]

FINDINGS: Lower chest: Unremarkable.

Hepatobiliary: No focal abnormality in the liver on this study
without intravenous contrast. Mild intrahepatic biliary duct
prominence in this patient status post cholecystectomy.

Pancreas: No focal mass lesion. No dilatation of the main duct. No
intraparenchymal cyst. No peripancreatic edema.

Spleen: No splenomegaly. No focal mass lesion.

Adrenals/Urinary Tract: No adrenal nodule or mass. Kidneys
unremarkable. No evidence for hydroureter. The urinary bladder
appears normal for the degree of distention.

Stomach/Bowel: Status post gastric bypass. Biliary limb is
nondilated. Roux limb is nondilated. No small bowel wall thickening.
No small bowel dilatation. The terminal ileum is normal. The
appendix is not well visualized, but there is no edema or
inflammation in the region of the cecum. No gross colonic mass. No
colonic wall thickening. Diverticular changes are noted in the left
colon without evidence of diverticulitis.

Vascular/Lymphatic: There is abdominal aortic atherosclerosis
without aneurysm. There is no gastrohepatic or hepatoduodenal
ligament lymphadenopathy. No retroperitoneal or mesenteric
lymphadenopathy. No pelvic sidewall lymphadenopathy.

Reproductive: Uterus surgically absent.  There is no adnexal mass.

Other: No intraperitoneal free fluid.

Musculoskeletal: Status post L4-5 fusion. No worrisome lytic or
sclerotic osseous abnormality.
IMPRESSION: 1. No acute findings in the abdomen or pelvis. Specifically, no
findings to explain the patient's history of abdominal pain with
nausea and vomiting.
2. Left colonic diverticulosis without diverticulitis.
3. Aortic Atherosclerosis ([id number]).

## 2023-04-09 NOTE — Telephone Encounter (Signed)
Requested medications are due for refill today.  Provider to decide  Requested medications are on the active medications list.  yes  Last refill. 08/29/2022 #100 3 rf  Future visit scheduled.   yes  Notes to clinic.  Refill not delegated.    Requested Prescriptions  Pending Prescriptions Disp Refills   traMADol (ULTRAM) 50 MG tablet 100 tablet 3    Sig: Take 1 tablet (50 mg total) by mouth every 6 (six) hours as needed for moderate pain or severe pain.     Not Delegated - Analgesics:  Opioid Agonists Failed - 04/08/2023  3:22 PM      Failed - This refill cannot be delegated      Failed - Urine Drug Screen completed in last 360 days      Passed - Valid encounter within last 3 months    Recent Outpatient Visits           1 month ago Benign essential HTN   [place] [facility] [place], [place], PA-C   2 months ago Left leg swelling   [place] [facility] [place], [place], MD   3 months ago Type 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, without long-term current use of insulin (HCC)   [place] [facility] [place], [place], PA-C   4 months ago Type 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, without long-term current use of insulin [facility])   [place] [place], [place], PA-C   7 months ago Hypoglycemia   [facility] Bosie Clos, MD       Future Appointments             In 1 month Ostwalt, Edmon Crape, PA-C [facility], [facility]

## 2023-04-11 ENCOUNTER — Ambulatory Visit (INDEPENDENT_AMBULATORY_CARE_PROVIDER_SITE_OTHER): Payer: 59 | Admitting: Physician Assistant

## 2023-04-11 ENCOUNTER — Encounter: Payer: Self-pay | Admitting: Physician Assistant

## 2023-04-11 ENCOUNTER — Other Ambulatory Visit: Payer: Self-pay | Admitting: Physician Assistant

## 2023-04-11 VITALS — BP 125/63 | HR 63 | Ht 63.0 in | Wt 136.0 lb

## 2023-04-11 DIAGNOSIS — R319 Hematuria, unspecified: Secondary | ICD-10-CM

## 2023-04-11 DIAGNOSIS — R3129 Other microscopic hematuria: Secondary | ICD-10-CM | POA: Diagnosis not present

## 2023-04-11 DIAGNOSIS — G8929 Other chronic pain: Secondary | ICD-10-CM

## 2023-04-11 DIAGNOSIS — E1122 Type 2 diabetes mellitus with diabetic chronic kidney disease: Secondary | ICD-10-CM | POA: Diagnosis not present

## 2023-04-11 DIAGNOSIS — N1832 Chronic kidney disease, stage 3b: Secondary | ICD-10-CM | POA: Diagnosis not present

## 2023-04-11 DIAGNOSIS — I10 Essential (primary) hypertension: Secondary | ICD-10-CM

## 2023-04-11 DIAGNOSIS — M545 Low back pain, unspecified: Secondary | ICD-10-CM | POA: Diagnosis not present

## 2023-04-11 DIAGNOSIS — M5417 Radiculopathy, lumbosacral region: Secondary | ICD-10-CM

## 2023-04-11 LAB — POCT URINALYSIS DIPSTICK
Bilirubin, UA: NEGATIVE
Glucose, UA: NEGATIVE
Ketones, UA: NEGATIVE
Leukocytes, UA: NEGATIVE
Nitrite, UA: NEGATIVE
Protein, UA: NEGATIVE
Spec Grav, UA: >=1.03 — AB (ref 1.010–1.025)
Urobilinogen, UA: 0.2 E.U./dL
pH, UA: 8 (ref 5.0–8.0)

## 2023-04-11 LAB — POCT GLYCOSYLATED HEMOGLOBIN (HGB A1C): Hemoglobin A1C: 5.7 % — AB (ref 4.0–5.6)

## 2023-04-11 MED ORDER — TRAMADOL HCL 50 MG PO TABS: 50.0000 mg | ORAL_TABLET | Freq: Three times a day (TID) | ORAL | 0 refills | Status: AC | PRN

## 2023-04-11 MED ORDER — LIDOCAINE 4 % EX PTCH: 1.0000 | MEDICATED_PATCH | CUTANEOUS | 1 refills | Status: DC

## 2023-04-11 NOTE — Progress Notes (Signed)
Established patient visit   Patient: Julie Acevedo   DOB: 05-30-1951   72 y.o. Female  MRN: [id number] Visit Date: 04/11/2023  Today's healthcare provider: [name], PA-C   Chief Complaint  Patient presents with   Back Pain   Subjective     HPI     Back Pain   This is a new problem.  There was not an injury that may have caused the pain.  Recent episode started in the past 7 days.  The problem has been gradually worsening since onset.  Pain is gluteal.  Severity of the pain is severe.  Pain occurs constantly.  The symptoms are aggravated by sitting and standing.  Treatment provided no relief.  Abdominal Pain: Absent.  Bowel incontinence: Present.  Chest pain: Absent.  Dysuria: Absent.  Fever: Absent.  Headaches: Absent.  Joint pains: Absent.  Weakness in leg: Absent.  Pelvic pain: Absent.  Tingling in lower extremities: Absent.  Urinary incontinence: Absent.  Weight loss: Absent.      Last edited by Shelly Bombard, CMA on 04/11/2023  8:37 AM.      Pt reports that current pain is different from her previous pain, more feels like kidney infection Has been taking gabapentin and tramadol 50mg  Bid  Medications Outpatient Medications Prior to Visit  Medication Sig   ACCU-CHEK GUIDE test strip USE TO CHECK BLOOD SUGAR UP TO 4 TIMES A DAY AS DIRECTED   Accu-Chek Softclix Lancets lancets SMARTSIG:Topical 1-4 Times Daily   blood glucose meter kit and supplies Dispense Accu-Chek Guide. Use up to four times daily as directed. 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   calcitRIOL (ROCALTROL) 0.25 MCG capsule Take 0.25 mcg by mouth daily.   gabapentin (NEURONTIN) 300 MG capsule Take 1 capsule (300 mg total) by mouth 3 (three) times daily.   Glucagon (GVOKE HYPOPEN 2-PACK) 1 MG/0.2ML SOAJ Inject 0.2 mLs into the skin as needed.   hydrOXYzine (VISTARIL) 100 MG capsule Take 1 capsule (100 mg total) by mouth 3 (three) times daily.   levETIRAcetam (KEPPRA) 500 MG tablet Take 1 tablet (500 mg total)  by mouth 2 (two) times daily.   linaclotide (LINZESS) 290 MCG CAPS capsule Take 1 capsule (290 mcg total) by mouth daily before breakfast. TAKE 1 CAPSULE BY MOUTH EVERY DAY BEFORE BREAKFAST   metFORMIN (GLUCOPHAGE) 500 MG tablet Take by mouth 2 (two) times daily with a meal.   metoprolol succinate (TOPROL-XL) 25 MG 24 hr tablet TAKE 1 TABLET (25 MG TOTAL) BY MOUTH DAILY.   mirtazapine (REMERON) 30 MG tablet TAKE ONE TABLET BY MOUTH EVERYDAY AT BEDTIME   omeprazole (PRILOSEC) 20 MG capsule Take 1 capsule (20 mg total) by mouth daily.   ondansetron (ZOFRAN) 4 MG tablet TAKE 1 TABLET BY MOUTH EVERY 8 HOURS AS NEEDED (Patient taking differently: Take 4 mg by mouth every 8 (eight) hours as needed for nausea or vomiting.)   polyethylene glycol powder (MIRALAX) 17 GM/SCOOP powder Take 1 scoop or 17 g 3 times a day for 2 days then use 1 scoop twice a day a day until you begin having soft bowel movements then cut back to 1 scoop a day.   rivastigmine (EXELON) 3 MG capsule Take 3 mg by mouth 2 (two) times daily.   rosuvastatin (CRESTOR) 40 MG tablet Take 1 tablet (40 mg total) by mouth daily.   traMADol (ULTRAM) 50 MG tablet Take 1 tablet (50 mg total) by mouth every 6 (six) hours as  needed for moderate pain or severe pain.   triamcinolone cream (KENALOG) 0.5 % Apply 1 Application topically 3 (three) times daily as needed (flares).   No facility-administered medications prior to visit.    Review of Systems  All other systems reviewed and are negative. Except see HPI      Objective    BP 125/63 (BP Location: Right Arm, Patient Position: Sitting, Cuff Size: Normal)   Pulse 63   Ht 5\' 3"  (1.6 m)   Wt 136 lb (61.7 kg)   SpO2 98%   BMI 24.09 kg/m    Physical Exam Vitals reviewed.  Constitutional:      General: She is not in acute distress.    Appearance: Normal appearance. She is well-developed. She is not diaphoretic.  HENT:     Head: Normocephalic and atraumatic.  Eyes:     General: No  scleral icterus.    Conjunctiva/sclera: Conjunctivae normal.  Neck:     Thyroid: No thyromegaly.  Cardiovascular:     Rate and Rhythm: Normal rate and regular rhythm.     Pulses: Normal pulses.     Heart sounds: Normal heart sounds. No murmur heard. Pulmonary:     Effort: Pulmonary effort is normal. No respiratory distress.     Breath sounds: Normal breath sounds. No wheezing, rhonchi or rales.  Abdominal:     General: Bowel sounds are normal. There is no distension.     Tenderness: There is no abdominal tenderness. There is no right CVA tenderness or left CVA tenderness.  Musculoskeletal:        General: Tenderness (lower back) present. No swelling or deformity.     Cervical back: Neck supple.     Right lower leg: No edema.     Left lower leg: No edema.  Lymphadenopathy:     Cervical: No cervical adenopathy.  Skin:    General: Skin is warm and dry.     Findings: No rash.  Neurological:     Mental Status: She is alert and oriented to person, place, and time. Mental status is at baseline.  Psychiatric:        Mood and Affect: Mood normal.        Behavior: Behavior normal.      No results found for any visits on 04/11/23.  Assessment & Plan    1. Other microscopic hematuria Suspected UTI ? With back pain - POCT urinalysis dipstick was +blood -bacteria, nitrites, leukocytes - Ambulatory referral to Urology - Urine microscopy Will reassess after receiving lab results  2. Chronic left-sided low back pain without sciatica 3. L-S radiculopathy Could be a flare up of chronic condition - Ambulatory referral to Urology - lidocaine (HM LIDOCAINE PATCH) 4 %; Place 1 patch onto the skin daily.  Dispense: 30 patch; Refill: 1 - traMADol (ULTRAM) 50 MG tablet; Take 1 tablet (50 mg total) by mouth every 8 (eight) hours as needed for up to 5 days.  Dispense: 15 tablet; Refill: 0 - Ambulatory referral to Pain Clinic  Advised to decrease/adjust the current dose of tramadol and start  taking it once daily and increase a dose of gabapentin to 1200mg  daily or to 1500mg  daily Rx lidocaine patches for topical control of pain Advised to use heat Referred to pain clinic for pain management  Type 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, without long-term current use of insulin (HCC) Chronic and previously stable Well controlled on lifestyle modifications - POCT HgB A1C 5.7 Advised to continue  with low carb diet and regular exercise Will Fu   No follow-ups on file.      The patient was advised to call back or seek an in-person evaluation if the symptoms worsen or if the condition fails to improve as anticipated.  I discussed the assessment and treatment plan with the patient. The patient was provided an opportunity to ask questions and all were answered. The patient agreed with the plan and demonstrated an understanding of the instructions.  I, [name], PA-C have reviewed all documentation for this visit. The documentation on  04/11/23  for the exam, diagnosis, procedures, and orders are all accurate and complete.  [name], [facility], MMS [facility] [phone] (phone) [phone] (fax)    [facility]

## 2023-04-11 NOTE — Telephone Encounter (Signed)
Requested Prescriptions  Pending Prescriptions Disp Refills   metoprolol succinate (TOPROL-XL) 25 MG 24 hr tablet [Pharmacy Med Name: metoprolol succinate ER 25 mg tablet,extended release 24 hr] 90 tablet 1    Sig: TAKE ONE TABLET BY MOUTH EVERY MORNING     Cardiovascular:  Beta Blockers Passed - 04/11/2023  3:49 PM      Passed - Last BP in normal range    BP Readings from Last 1 Encounters:  04/11/23 125/63         Passed - Last Heart Rate in normal range    Pulse Readings from Last 1 Encounters:  04/11/23 63         Passed - Valid encounter within last 6 months    Recent Outpatient Visits           Today Other microscopic hematuria   [place] [facility] [place], [place], PA-C   1 month ago Benign essential HTN   [place] [facility] [place], [place], [place]   2 months ago Left leg swelling   [place] [facility] [place], [place], MD   3 months ago Type 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, without long-term current use of insulin (HCC)   [place] [facility] [place], [place], PA-C   4 months ago Type 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, without long-term current use of insulin (HCC)   [place] [facility] [place], [place], PA-C       Future Appointments             In 1 month Ostwalt, [place], PA-C [facility], [facility]

## 2023-04-12 DIAGNOSIS — R319 Hematuria, unspecified: Secondary | ICD-10-CM | POA: Diagnosis not present

## 2023-04-13 LAB — URINALYSIS, MICROSCOPIC ONLY
Bacteria, UA: NONE SEEN
Casts: NONE SEEN /lpf
RBC, Urine: NONE SEEN /hpf (ref 0–2)
WBC, UA: NONE SEEN /hpf (ref 0–5)

## 2023-04-15 NOTE — Progress Notes (Signed)
Please, let pt know that her lab results are WNL including urine tests.

## 2023-04-19 ENCOUNTER — Other Ambulatory Visit: Payer: Self-pay | Admitting: Physician Assistant

## 2023-04-19 DIAGNOSIS — E440 Moderate protein-calorie malnutrition: Secondary | ICD-10-CM

## 2023-04-23 ENCOUNTER — Other Ambulatory Visit: Payer: Self-pay | Admitting: Physician Assistant

## 2023-04-23 NOTE — Telephone Encounter (Signed)
Unable to refill per protocol, Rx request last refilled 04/19/23 for 30 days and 1 refill.E-Prescribing Status: Receipt confirmed by pharmacy (04/19/2023  2:37 PM EDT.  Requested Prescriptions  Pending Prescriptions Disp Refills   mirtazapine (REMERON SOL-TAB) 15 MG disintegrating tablet [Pharmacy Med Name: MIRTAZAPINE 15 MG ODT] 30 tablet 1    Sig: TAKE 1 TABLET BY MOUTH EVERYDAY AT BEDTIME     Psychiatry: Antidepressants - mirtazapine Passed - 04/23/2023  4:51 AM      Passed - Valid encounter within last 6 months    Recent Outpatient Visits           1 week ago Other microscopic hematuria   [place] [facility] [place], [place], PA-C   2 months ago Benign essential HTN   [place] [facility] [place], [place], PA-C   3 months ago Left leg swelling   [place] [facility] [place], [place], MD   3 months ago Type 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, without long-term current use of insulin (HCC)   [facility] [place], [place], PA-C   5 months ago Type 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, without long-term current use of insulin (HCC)   [place] [facility] [place], [place], PA-C       Future Appointments             In 3 weeks [name], PA-C [place] [facility], [facility]   In 4 weeks Vanna Scotland, MD [facility]

## 2023-04-25 DIAGNOSIS — H5789 Other specified disorders of eye and adnexa: Secondary | ICD-10-CM | POA: Diagnosis not present

## 2023-04-25 DIAGNOSIS — H02531 Eyelid retraction right upper eyelid: Secondary | ICD-10-CM | POA: Diagnosis not present

## 2023-04-25 DIAGNOSIS — H189 Unspecified disorder of cornea: Secondary | ICD-10-CM | POA: Diagnosis not present

## 2023-04-30 ENCOUNTER — Ambulatory Visit: Payer: Self-pay | Admitting: *Deleted

## 2023-04-30 MED ORDER — TRAMADOL HCL 50 MG PO TABS: 50.0000 mg | ORAL_TABLET | Freq: Three times a day (TID) | ORAL | 0 refills | Status: AC | PRN

## 2023-04-30 NOTE — Telephone Encounter (Signed)
  Chief Complaint: medication request- chronic back pain Symptoms: back pain with radiation down leg Frequency: chronic  Disposition: [] ED /[] Urgent Care (no appt availability in office) / [] Appointment(In office/virtual)/ []  [place] [facility]/ [] Home Care/ [] Refused Recommended Disposition /[] [place] [facility]/ [x]  Follow-up with PCP Additional Notes: Patient was recently seen- she did get referral to pain clinic- but has not heard anything from that. Patient does not have medication for her pain- she is requesting help with pain. Patient advised I would send request to provider for follow up and to address pain she is having now

## 2023-04-30 NOTE — Addendum Note (Signed)
Addended by: Bing Neighbors on: 04/30/2023 04:47 PM   Modules accepted: Orders

## 2023-04-30 NOTE — Telephone Encounter (Signed)
Reason for Disposition  Caller requesting a CONTROLLED substance prescription refill (e.g., narcotics, ADHD medicines)  Answer Assessment - Initial Assessment Questions 1. ONSET: "When did the pain begin?"      chronic 2. LOCATION: "Where does it hurt?" (upper, mid or lower back)    Mid back/ Lower back pain 3. SEVERITY: "How bad is the pain?"  (e.g., Scale 1-10; mild, moderate, or severe)   - MILD (1-3): Doesn't interfere with normal activities.    - MODERATE (4-7): Interferes with normal activities or awakens from sleep.    - SEVERE (8-10): Excruciating pain, unable to do any normal activities.      severe 4. PATTERN: "Is the pain constant?" (e.g., yes, no; constant, intermittent)      constant 5. RADIATION: "Does the pain shoot into your legs or somewhere else?"     Into the thigh  6. CAUSE:  "What do you think is causing the back pain?"      Arthritis- hx back surgery 7. BACK OVERUSE:  "Any recent lifting of heavy objects, strenuous work or exercise?"     na 8. MEDICINES: "What have you taken so far for the pain?" (e.g., nothing, acetaminophen, NSAIDS)     Patient does not have anything for pain now  Answer Assessment - Initial Assessment Questions 1. DRUG NAME: "What medicine do you need to have refilled?"     Tramadol 2. REFILLS REMAINING: "How many refills are remaining?" (Note: The label on the medicine or pill bottle will show how many refills are remaining. If there are no refills remaining, then a renewal may be needed.)     none 3. EXPIRATION DATE: "What is the expiration date?" (Note: The label states when the prescription will expire, and thus can no longer be refilled.)     na 4. PRESCRIBING HCP: "Who prescribed it?" Reason: If prescribed by specialist, call should be referred to that group.     PCP 5. SYMPTOMS: "Do you have any symptoms?"     Chronic back pain Patient is requesting Rx for her back pain- she has not received call from referral to pain clinic. Needs  follow up.  Protocols used: Back Pain-A-AH, Medication Refill and Renewal Call-A-AH

## 2023-04-30 NOTE — Telephone Encounter (Signed)
Refill for 5 day supply of tramadol sent to patients pharmacy for back pain

## 2023-05-01 NOTE — Telephone Encounter (Signed)
Patient advised.

## 2023-05-03 ENCOUNTER — Other Ambulatory Visit: Payer: Self-pay | Admitting: Physician Assistant

## 2023-05-03 DIAGNOSIS — E440 Moderate protein-calorie malnutrition: Secondary | ICD-10-CM

## 2023-05-03 NOTE — Telephone Encounter (Signed)
Requested medication (s) are due for refill today:no  Requested medication (s) are on the active medication list: yes  Last refill:  04/19/23 #30 1 RF  Future visit scheduled: yes  Notes to clinic:  pharmacy requesting 90 day refills - sending back to see if appropriate.    Requested Prescriptions  Pending Prescriptions Disp Refills   mirtazapine (REMERON) 30 MG tablet [Pharmacy Med Name: MIRTAZAPINE 30 MG TABLET] 90 tablet 1    Sig: TAKE 1 TABLET BY MOUTH EVERYDAY AT BEDTIME     Psychiatry: Antidepressants - mirtazapine Passed - 05/03/2023  9:03 AM      Passed - Valid encounter within last 6 months    Recent Outpatient Visits           3 weeks ago Other microscopic hematuria   [place] [facility] [place], [place], PA-C   2 months ago Benign essential HTN   [facility] [place], [place], PA-C   3 months ago Left leg swelling   [place] [facility] [place], [place], MD   4 months ago Type 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, without long-term current use of insulin (HCC)   [place] [facility] [place], [place], PA-C   5 months ago Type 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, without long-term current use of insulin (HCC)   [place] [facility] [place], [place], PA-C       Future Appointments             In 1 week [name], PA-C [place] [facility]   In 2 weeks Vanna Scotland, MD [facility]

## 2023-05-06 ENCOUNTER — Other Ambulatory Visit: Payer: Self-pay | Admitting: Physician Assistant

## 2023-05-06 DIAGNOSIS — E440 Moderate protein-calorie malnutrition: Secondary | ICD-10-CM

## 2023-05-07 NOTE — Telephone Encounter (Signed)
Rx was sent to [facility] on 04/19/23 #30/1 RF.   Requested Prescriptions  Pending Prescriptions Disp Refills   mirtazapine (REMERON) 30 MG tablet [Pharmacy Med Name: mirtazapine 30 mg tablet] 30 tablet 1    Sig: Take 1 tablet by mouth once daily at bedtime     Psychiatry: Antidepressants - mirtazapine Passed - 05/06/2023  8:04 AM      Passed - Valid encounter within last 6 months    Recent Outpatient Visits           3 weeks ago Other microscopic hematuria   [place] [facility] [place], [place], PA-C   2 months ago Benign essential HTN   [place] [facility] [place], [place], PA-C   3 months ago Left leg swelling   [place] [place], [place], MD   4 months ago Type 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, without long-term current use of insulin (HCC)   [place] [facility] [place], [place], PA-C   5 months ago Type 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, without long-term current use of insulin (HCC)   [place] [facility] [place], [place], PA-C       Future Appointments             In 1 week [name], PA-C [place] [facility]   In 2 weeks Vanna Scotland, MD [facility]

## 2023-05-10 ENCOUNTER — Other Ambulatory Visit: Payer: Self-pay | Admitting: Physician Assistant

## 2023-05-10 NOTE — Telephone Encounter (Signed)
Requested Prescriptions  Pending Prescriptions Disp Refills   LINZESS 290 MCG CAPS capsule [Pharmacy Med Name: Linzess 290 mcg capsule] 90 capsule 0    Sig: TAKE ONE CAPSULE BY MOUTH BEFORE BREAKFAST     Gastroenterology: Irritable Bowel Syndrome Passed - 05/10/2023  2:07 PM      Passed - Valid encounter within last 12 months    Recent Outpatient Visits           4 weeks ago Other microscopic hematuria   [place] [facility] [place], [place], PA-C   2 months ago Benign essential HTN   [place] [facility] [place], [place], PA-C   3 months ago Left leg swelling   [place] [place], [place], MD   4 months ago Type 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, without long-term current use of insulin (HCC)   [place] [facility] [place], [place], PA-C   5 months ago Type 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, without long-term current use of insulin (HCC)   [place] [facility] [place], [place], PA-C       Future Appointments             In 6 days [name], PA-C [place] [facility]   In 1 week Vanna Scotland, MD [facility]

## 2023-05-16 ENCOUNTER — Encounter: Payer: Self-pay | Admitting: Physician Assistant

## 2023-05-16 ENCOUNTER — Ambulatory Visit (INDEPENDENT_AMBULATORY_CARE_PROVIDER_SITE_OTHER): Payer: 59 | Admitting: Physician Assistant

## 2023-05-16 VITALS — BP 136/59 | HR 60 | Temp 98.4°F | Resp 16 | Wt 140.7 lb

## 2023-05-16 DIAGNOSIS — M7989 Other specified soft tissue disorders: Secondary | ICD-10-CM

## 2023-05-16 DIAGNOSIS — M79604 Pain in right leg: Secondary | ICD-10-CM

## 2023-05-16 DIAGNOSIS — D631 Anemia in chronic kidney disease: Secondary | ICD-10-CM

## 2023-05-16 DIAGNOSIS — M545 Low back pain, unspecified: Secondary | ICD-10-CM

## 2023-05-16 DIAGNOSIS — I10 Essential (primary) hypertension: Secondary | ICD-10-CM

## 2023-05-16 DIAGNOSIS — E782 Mixed hyperlipidemia: Secondary | ICD-10-CM | POA: Diagnosis not present

## 2023-05-16 DIAGNOSIS — E1122 Type 2 diabetes mellitus with diabetic chronic kidney disease: Secondary | ICD-10-CM

## 2023-05-16 DIAGNOSIS — N1832 Chronic kidney disease, stage 3b: Secondary | ICD-10-CM

## 2023-05-16 DIAGNOSIS — G8929 Other chronic pain: Secondary | ICD-10-CM | POA: Diagnosis not present

## 2023-05-16 NOTE — Assessment & Plan Note (Signed)
Chronic and stable Last GFR was 39 Continue to drink 8 to 12 glasses of water every day, avoid NSAIDs, check renal function at her next follow-up

## 2023-05-16 NOTE — Assessment & Plan Note (Signed)
Chronic and previously stable Well-controlled on lifestyle modifications Check A1c from April 2024 was 5.7 Continue low-carb diet regular exercise as tolerated

## 2023-05-16 NOTE — Assessment & Plan Note (Signed)
Chronic, previously controlled Continue  current regiment with Crestor 40 Mg Continue low cholesterol diet and daily exercise Will need updated labs for next follow-up

## 2023-05-16 NOTE — Assessment & Plan Note (Signed)
Resolved.  History of left foot injury, attributed trauma to the blood vessel in his foot.  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  US venous Doppler of the left leg was negative for DVT/2.6.24

## 2023-05-16 NOTE — Assessment & Plan Note (Addendum)
Chronic congestion with frequent flareups Continue to use lidocaine patch 4%, Place 1 patch onto the skin daily  Advised to continue decreasing dose of tramadol 50 Mg once daily as needed and increase dose of gabapentin of 1200/1500 Mg daily continue to use heat Referral to pain clinic pending Will check this scheduler next week

## 2023-05-16 NOTE — Progress Notes (Signed)
Established patient visit   I,Julie Acevedo,acting as a scribe for [employer], PA-C.,have documented all relevant documentation on the behalf of Julie Lat, PA-C,as directed by  [employer], PA-C while in the presence of [employer], PA-C.    Patient: Julie Acevedo   DOB: [birth year]   71 y.o. Female  MRN: [id number] Visit Date: 05/16/2023  Today's healthcare provider: [name], PA-C   Chief Complaint  Patient presents with   Medical Management of Chronic Issues   Hypertension   Subjective    HPI  Hypertension, follow-up  BP Readings from Last 3 Encounters:  05/16/23 (!) 136/59  04/11/23 125/63  03/05/23 135/73   Wt Readings from Last 3 Encounters:  05/16/23 140 lb 11.2 oz (63.8 kg)  04/11/23 136 lb (61.7 kg)  04/02/23 137 lb (62.1 kg)     She was last seen for hypertension 3 months ago.  BP at that visit was 132/66. Management since that visit includes continue same medication. 110/70 She reports good compliance with treatment. She is not having side effects.  She is following a Low Sodium diet. She is exercising. She does not smoke.  Use of agents associated with hypertension: none.   Outside blood pressures are checked occasionally. Symptoms: No chest pain No chest pressure  No palpitations No syncope  No dyspnea No orthopnea  No paroxysmal nocturnal dyspnea Yes lower extremity edema   Pertinent labs Lab Results  Component Value Date   CHOL 134 09/06/2021   HDL 60 09/06/2021   LDLCALC 63 09/06/2021   TRIG 54 09/06/2021   CHOLHDL 2.2 09/06/2021   Lab Results  Component Value Date   NA 141 11/20/2022   K 3.8 11/20/2022   CREATININE 1.46 (H) 11/20/2022   GFRNONAA 39 (L) 11/20/2022   GLUCOSE CANCELED 12/25/2022   TSH 2.483 01/18/2023     The ASCVD Risk score (Arnett DK, et al., 2019) failed to calculate for the following reasons:   The patient has a prior MI or stroke diagnosis  Referral to pain clinic pending. Temporarily  , received a refill for Tramadol. ---------------------------------------------------------------------------------------------------   Medications: Outpatient Medications Prior to Visit  Medication Sig   ACCU-CHEK GUIDE test strip USE TO CHECK BLOOD SUGAR UP TO 4 TIMES A DAY AS DIRECTED   Accu-Chek Softclix Lancets lancets SMARTSIG:Topical 1-4 Times Daily   blood glucose meter kit and supplies Dispense Accu-Chek Guide. Use up to four times daily as directed. 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   calcitRIOL (ROCALTROL) 0.25 MCG capsule Take 0.25 mcg by mouth daily.   gabapentin (NEURONTIN) 300 MG capsule Take 1 capsule (300 mg total) by mouth 3 (three) times daily.   Glucagon (GVOKE HYPOPEN 2-PACK) 1 MG/0.2ML SOAJ Inject 0.2 mLs into the skin as needed.   hydrOXYzine (VISTARIL) 100 MG capsule Take 1 capsule (100 mg total) by mouth 3 (three) times daily.   levETIRAcetam (KEPPRA) 500 MG tablet Take 1 tablet (500 mg total) by mouth 2 (two) times daily.   lidocaine (HM LIDOCAINE PATCH) 4 % Place 1 patch onto the skin daily.   LINZESS 290 MCG CAPS capsule TAKE ONE CAPSULE BY MOUTH BEFORE BREAKFAST   metFORMIN (GLUCOPHAGE) 500 MG tablet Take by mouth 2 (two) times daily with a meal.   metoprolol succinate (TOPROL-XL) 25 MG 24 hr tablet TAKE ONE TABLET BY MOUTH EVERY MORNING   mirtazapine (REMERON) 30 MG tablet TAKE 1 TABLET BY MOUTH EVERYDAY AT BEDTIME   omeprazole (PRILOSEC) 20 MG  capsule Take 1 capsule (20 mg total) by mouth daily.   ondansetron (ZOFRAN) 4 MG tablet TAKE 1 TABLET BY MOUTH EVERY 8 HOURS AS NEEDED (Patient taking differently: Take 4 mg by mouth every 8 (eight) hours as needed for nausea or vomiting.)   polyethylene glycol powder (MIRALAX) 17 GM/SCOOP powder Take 1 scoop or 17 g 3 times a day for 2 days then use 1 scoop twice a day a day until you begin having soft bowel movements then cut back to 1 scoop a day.   rivastigmine (EXELON) 3 MG capsule Take 3 mg by mouth 2 (two) times daily.    rosuvastatin (CRESTOR) 40 MG tablet Take 1 tablet (40 mg total) by mouth daily.   triamcinolone cream (KENALOG) 0.5 % Apply 1 Application topically 3 (three) times daily as needed (flares).   No facility-administered medications prior to visit.    Review of Systems  All other systems reviewed and are negative. Except see HPI      Objective    BP (!) 136/59   Pulse 60   Temp 98.4 F (36.9 C) (Oral)   Resp 16   Wt 140 lb 11.2 oz (63.8 kg)   SpO2 98% Comment: room air  BMI 24.92 kg/m    Physical Exam Vitals reviewed.  Constitutional:      General: She is not in acute distress.    Appearance: Normal appearance. She is well-developed. She is not diaphoretic.  HENT:     Head: Normocephalic and atraumatic.  Eyes:     General: No scleral icterus.    Conjunctiva/sclera: Conjunctivae normal.  Neck:     Thyroid: No thyromegaly.  Cardiovascular:     Rate and Rhythm: Normal rate and regular rhythm.     Pulses: Normal pulses.     Heart sounds: Normal heart sounds. No murmur heard. Pulmonary:     Effort: Pulmonary effort is normal. No respiratory distress.     Breath sounds: Normal breath sounds. No wheezing, rhonchi or rales.  Musculoskeletal:     Cervical back: Neck supple.     Right lower leg: No edema.     Left lower leg: No edema.  Lymphadenopathy:     Cervical: No cervical adenopathy.  Skin:    General: Skin is warm and dry.     Findings: No rash.  Neurological:     Mental Status: She is alert and oriented to person, place, and time. Mental status is at baseline.  Psychiatric:        Mood and Affect: Mood normal.        Behavior: Behavior normal.     No results found for any visits on 05/16/23.  Assessment & Plan      Problem List Items Addressed This Visit       Cardiovascular and Mediastinum   Benign essential HTN (Chronic)    Chronic and well-controlled Today blood pressure was 136/59 Patient was advised to continue current regimen /metoprolol 25  Mg, Crestor 40 Mg and lifestyle modifications/low sodium intake , daily exercise.         Endocrine   Type 2 diabetes mellitus (HCC) - Primary    Chronic and previously stable Well-controlled on lifestyle modifications Check A1c from April 2024 was 5.7 Continue low-carb diet regular exercise as tolerated         Genitourinary   Stage 3b chronic kidney disease (HCC)   Anemia in chronic kidney disease    Chronic and stable Last GFR was 39 Continue  to drink 8 to 12 glasses of water every day, avoid NSAIDs, check renal function at her next follow-up        Other   Back pain, chronic   HLD (hyperlipidemia)    Chronic, previously controlled Continue  current regiment with Crestor 40 Mg Continue low cholesterol diet and daily exercise Will need updated labs for next follow-up      Low back pain radiating to right lower extremity    Chronic congestion with frequent flareups Continue to use lidocaine patch 4%, Place 1 patch onto the skin daily  Completed a course of tramadol 50 Mg every 8 hours as needed for up to 5 days.  Advised to decrease dose of tramadol to once daily and increase the dose of gabapentin to 1200 Mg daily continue to use heat Referral to pain clinic pending       Left leg swelling    Resolved.  History of left foot injury, attributed trauma to the blood vessel in his foot.  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  US venous Doppler of the left leg was negative for DVT/2.6.24        Return in about 3 months (around 08/16/2023) for chronic disease f/u, BP f/u.      The patient was advised to call back or seek an in-person evaluation if the symptoms worsen or if the condition fails to improve as anticipated.  I discussed the assessment and treatment plan with the patient. The patient was provided an opportunity to ask questions and all were answered. The patient agreed with the plan and demonstrated an understanding of the instructions.  I, Julie Lat,  PA-C have reviewed all documentation for this visit. The documentation on  05/16/23 for the exam, diagnosis, procedures, and orders are all accurate and complete.  Julie Acevedo, [facility], MMS [facility] [phone] (phone) [phone] (fax)  [facility]

## 2023-05-16 NOTE — Assessment & Plan Note (Signed)
Chronic and well-controlled Today blood pressure was 136/59 Patient was advised to continue current regimen /metoprolol 25 Mg, Crestor 40 Mg and lifestyle modifications/low sodium intake , daily exercise.

## 2023-05-17 ENCOUNTER — Other Ambulatory Visit: Payer: Self-pay | Admitting: Dermatology

## 2023-05-17 DIAGNOSIS — L723 Sebaceous cyst: Secondary | ICD-10-CM

## 2023-05-21 ENCOUNTER — Ambulatory Visit: Payer: 59 | Admitting: Urology

## 2023-05-21 NOTE — Progress Notes (Unsigned)
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, without long-term current use of insulin (HCC) 07/25/2009   Chronic pain syndrome 05/22/2023   Pharmacologic therapy 05/22/2023   Disorder of skeletal system 05/22/2023   Problems influencing health status 05/22/2023   Resolved Ambulatory Problems    Diagnosis Date Noted   DM (diabetes mellitus) (HCC) 01/27/2012   Chest pain, localized  01/27/2012   HTN (hypertension) 01/27/2012   Acute encephalopathy 11/15/2012   Dysphasia 11/15/2012   Diabetes (HCC) 07/25/2009   Decreased potassium in the blood 06/11/2015   Adiposity 11/21/2009   Past Medical History:  Diagnosis Date   COVID-19 11/19/2019    Diverticulitis    History of methicillin resistant staphylococcus aureus (MRSA) 2017   Myocardial infarction (HCC) 1995   Short-term memory loss    Thyroid nodule       Interventional Therapies  Risk Factors  Considerations:  Cardiologist: Dorothyann Peng, MD ([facility])  Neurologist: Cristopher Peru, MD [facility]) Hx of MI  Hx of TIAs  T2DM  Stage 3b CKD  [name]  Memory impairment  Hx of Bowel Obstruction (04/2019)  GERD  s/p Gastric Bypass for obesity  Malnutrition-malabsorption  Hx. Seizures   Planned  Pending:      Under consideration:   Pending   Completed:   None at this time   Therapeutic  Palliative (PRN) options:   None established   Completed by other providers:   Therapeutic/diagnostic right L5 TFESI x2 (04/11/2016, 05/02/2016) by Merri Ray, DO [facility])  Therapeutic/diagnostic right S1 TFESI x2 (04/11/2016, 05/02/2016) by Merri Ray, DO [facility])  Therapeutic/diagnostic right L3 TFESI x1 (07/19/2016) by Merri Ray, DO [facility])      Note by: Oswaldo Done, MD (TTS technology used. I apologize for any typographical errors that were not detected and corrected.) Date: 05/22/2023; Time: 6:44 AM

## 2023-05-22 ENCOUNTER — Ambulatory Visit (HOSPITAL_BASED_OUTPATIENT_CLINIC_OR_DEPARTMENT_OTHER): Payer: 59 | Admitting: Pain Medicine

## 2023-05-22 DIAGNOSIS — M899 Disorder of bone, unspecified: Secondary | ICD-10-CM | POA: Insufficient documentation

## 2023-05-22 DIAGNOSIS — Z79899 Other long term (current) drug therapy: Secondary | ICD-10-CM | POA: Insufficient documentation

## 2023-05-22 DIAGNOSIS — G894 Chronic pain syndrome: Secondary | ICD-10-CM

## 2023-05-22 DIAGNOSIS — Z789 Other specified health status: Secondary | ICD-10-CM | POA: Insufficient documentation

## 2023-05-22 DIAGNOSIS — Z91199 Patient's noncompliance with other medical treatment and regimen due to unspecified reason: Secondary | ICD-10-CM

## 2023-05-22 IMAGING — US US BREAST*L* LIMITED INC AXILLA
1 series · 3 of 3 positions shown · non-contrast
Comparison: Previous exam(s).

CLINICAL DATA: Newly appreciated palpable area since [REDACTED]

EXAM:
DIGITAL DIAGNOSTIC UNILATERAL LEFT MAMMOGRAM WITH TOMOSYNTHESIS AND
CAD; ULTRASOUND LEFT BREAST LIMITED
TECHNIQUE: Left digital diagnostic mammography and breast tomosynthesis was
performed. The images were evaluated with computer-aided detection.;
Targeted ultrasound examination of the left breast was performed

[Series 1: us breast*left* limited inc axilla · 0.07mm/px · 3 of 3 slices shown]
[im 1/3]
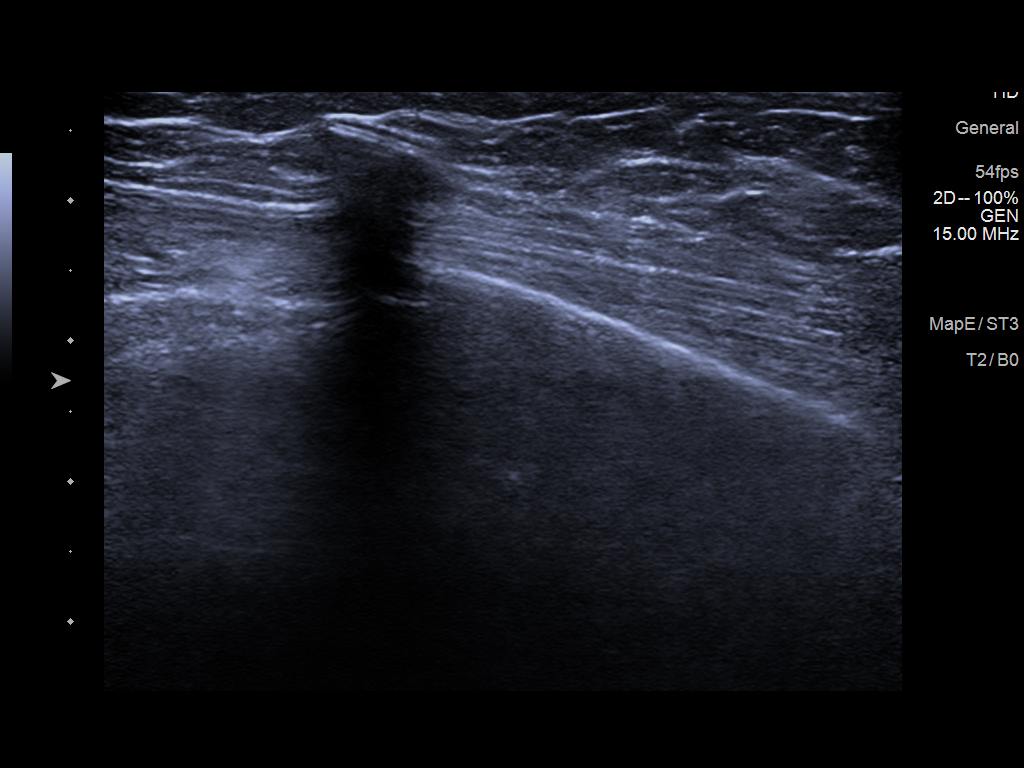
[im 2/3]
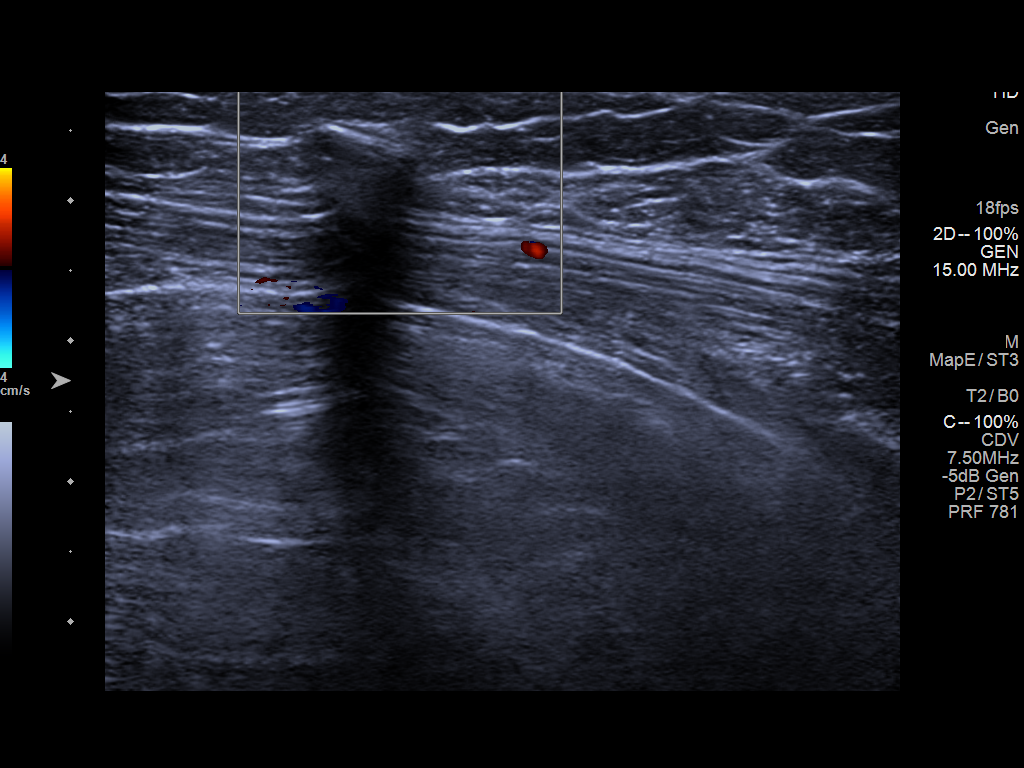
[im 3/3]
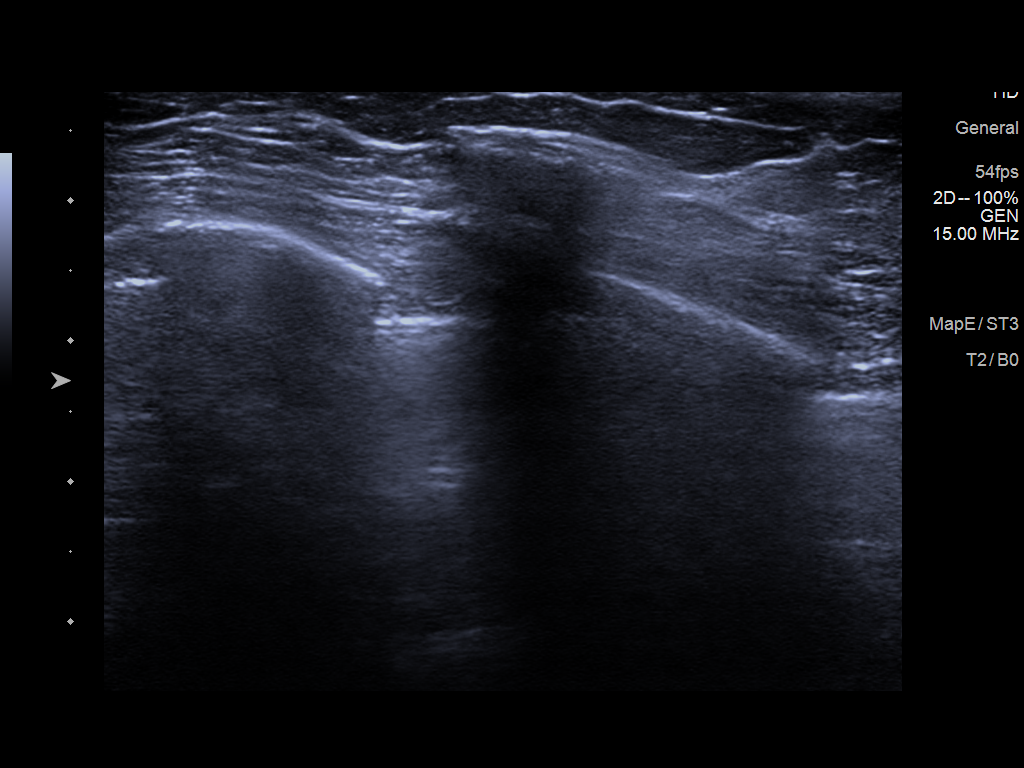

[3 of 3 positions shown; findings below may reference images not displayed]

ACR Breast Density Category c: The breast tissue is heterogeneously
dense, which may obscure small masses.
FINDINGS: Spot compression tomosynthesis views were obtained of the site of
palpable concern. Patient's cardiac loop recorder is near the site
of palpable concern on diagnostic images. No suspicious mass,
distortion, or microcalcifications are identified to suggest
presence of malignancy.

On physical exam, there is a firm area in the LEFT upper inner
breast at the site of palpable concern.

Targeted ultrasound was performed of the site of palpable concern.
There is an echogenic linear focus with posterior acoustic shadowing
consistent with patient's cardiac loop recorder at the site of
palpable concern. No suspicious cystic or solid mass is seen in the
region.
IMPRESSION: 1. Patient's cardiac loop recorder is at the site of palpable
concern. No mammographic or sonographic evidence of malignancy.

RECOMMENDATION:
Recommend return to annual screening mammography, due November 2021

I have discussed the findings and recommendations with the patient.
If applicable, a reminder letter will be sent to the patient
regarding the next appointment.

BI-RADS CATEGORY  2: Benign.

## 2023-05-22 IMAGING — MG MM DIGITAL DIAGNOSTIC UNILAT*L* W/ TOMO W/ CAD
8 series · 8 of 24 positions shown · non-contrast
Comparison: Previous exam(s).

CLINICAL DATA: Newly appreciated palpable area since [REDACTED]

EXAM:
DIGITAL DIAGNOSTIC UNILATERAL LEFT MAMMOGRAM WITH TOMOSYNTHESIS AND
CAD; ULTRASOUND LEFT BREAST LIMITED
TECHNIQUE: Left digital diagnostic mammography and breast tomosynthesis was
performed. The images were evaluated with computer-aided detection.;
Targeted ultrasound examination of the left breast was performed

[L TAN synth-2D]
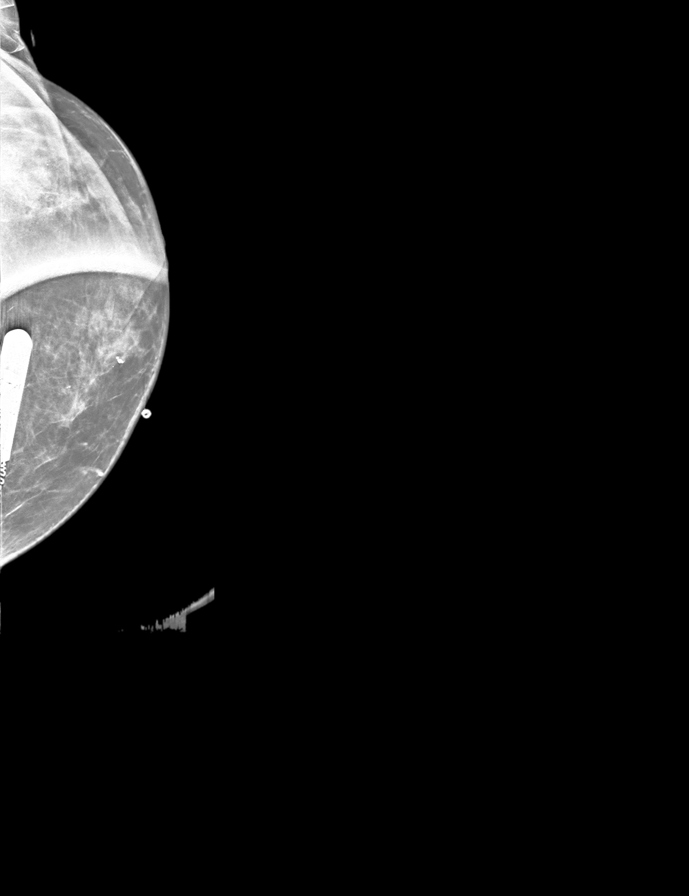

[L CC synth-2D (1 of 2)]
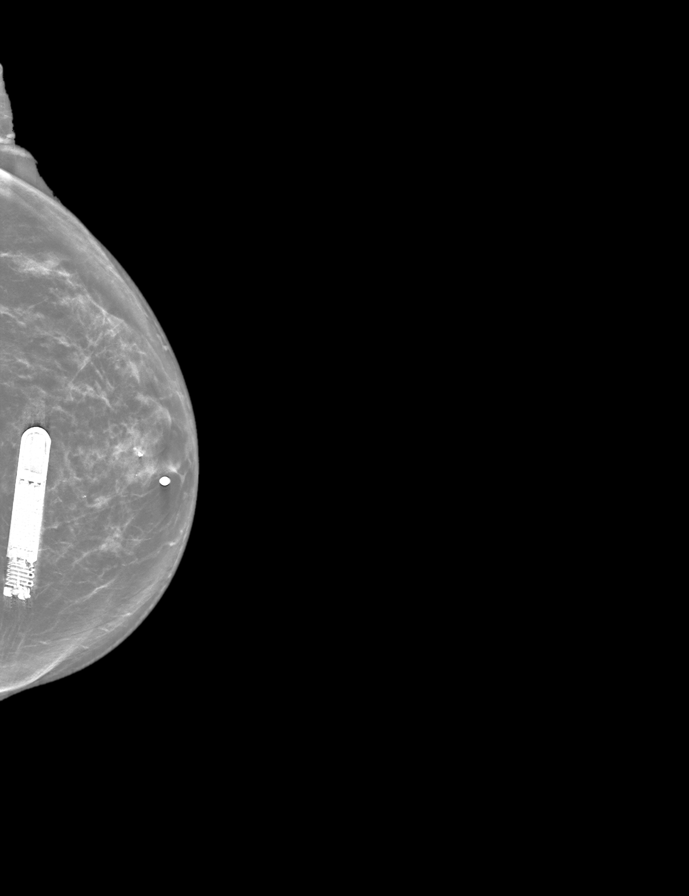

[L MLO synth-2D]
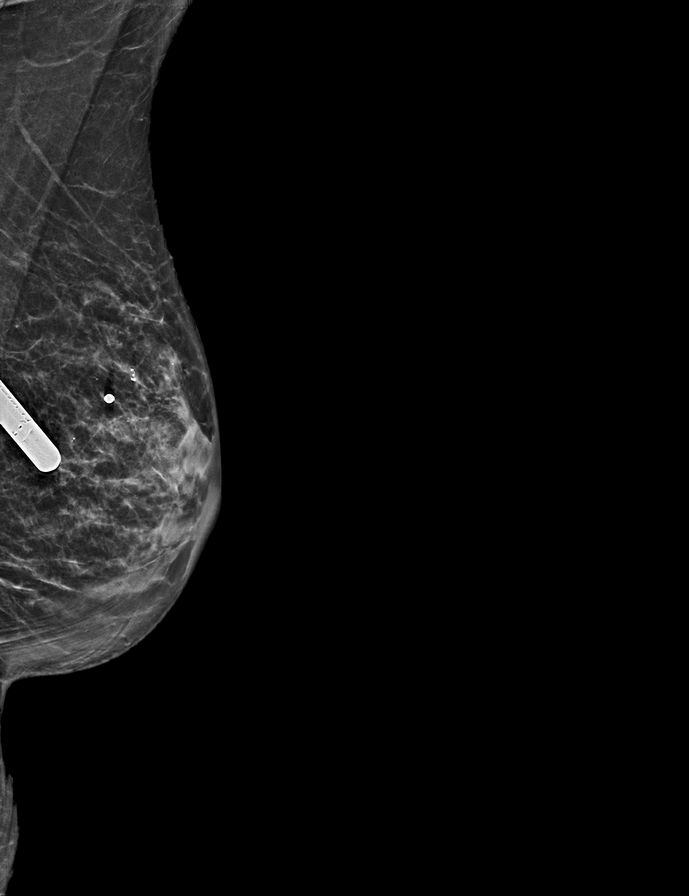

[L CC synth-2D (2 of 2)]
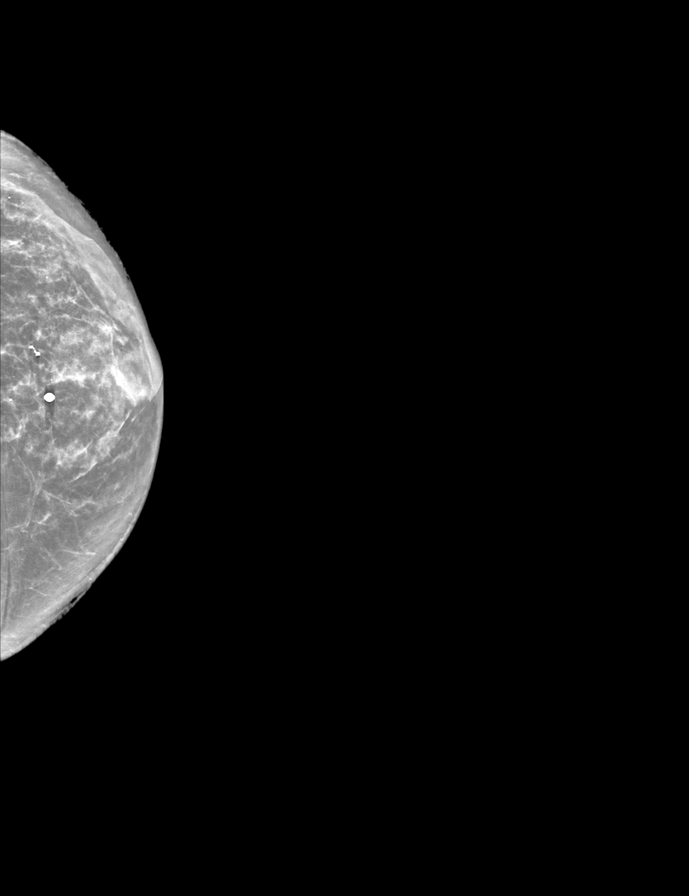

[L MLO tomo · tomo slice 27/54.0]
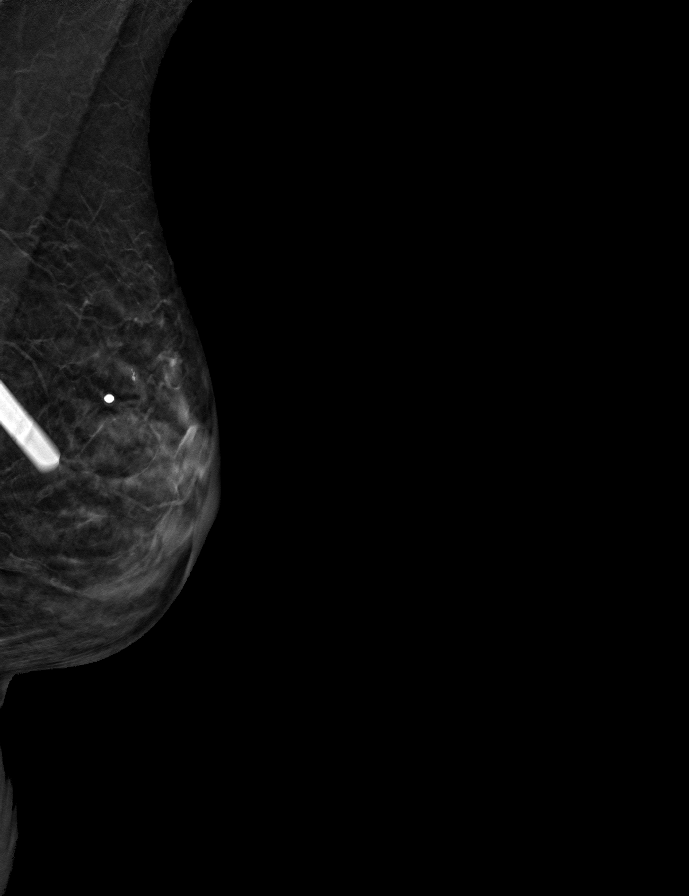

[L CC tomo (1 of 2) · tomo slice 28/55.0]
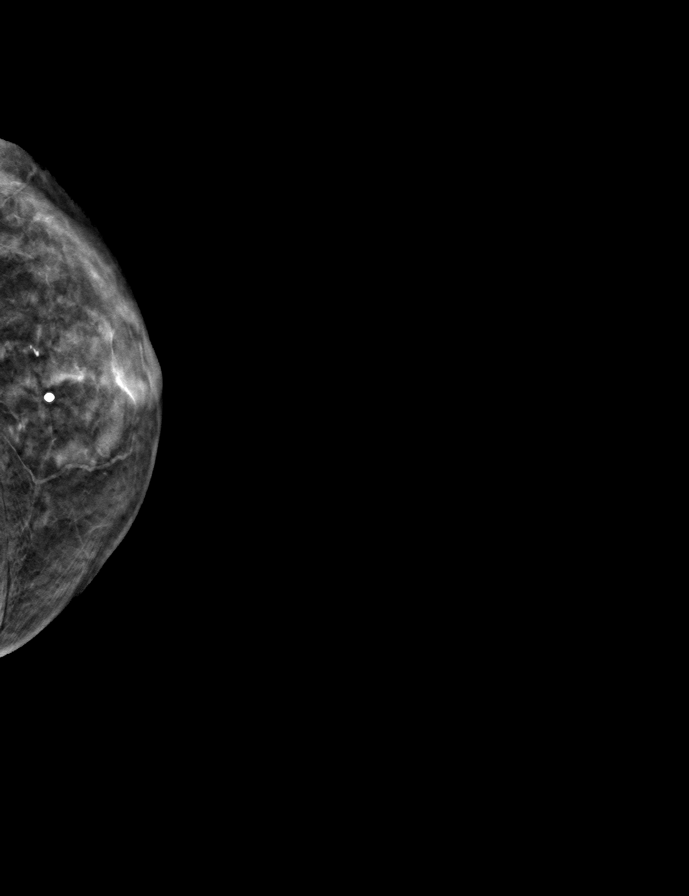

[L CC tomo (2 of 2) · tomo slice 31/62.0]
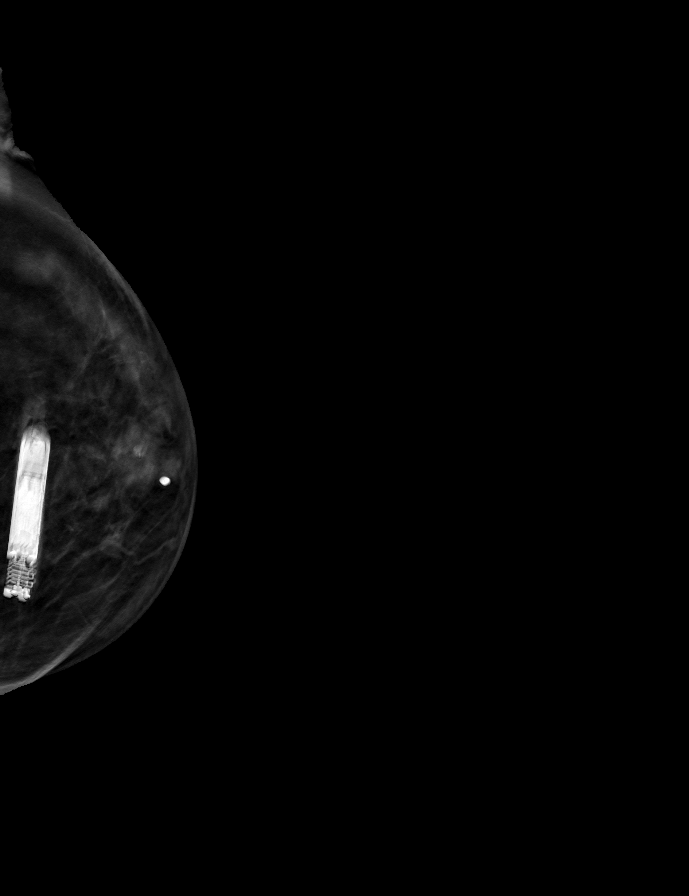

[L TAN tomo · tomo slice 25/48.0]
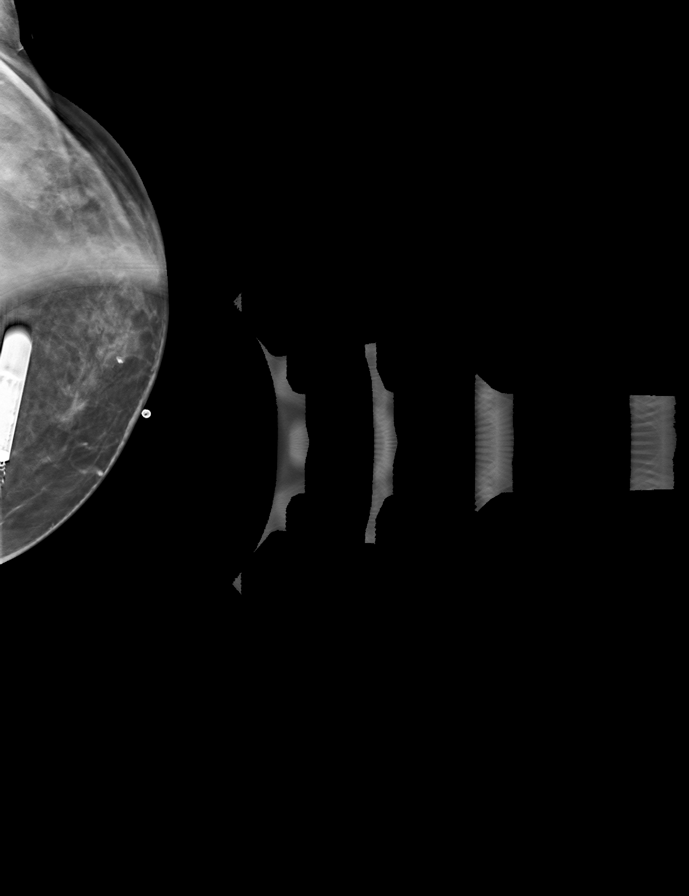

[8 of 24 positions shown; findings below may reference images not displayed]

ACR Breast Density Category c: The breast tissue is heterogeneously
dense, which may obscure small masses.
FINDINGS: Spot compression tomosynthesis views were obtained of the site of
palpable concern. Patient's cardiac loop recorder is near the site
of palpable concern on diagnostic images. No suspicious mass,
distortion, or microcalcifications are identified to suggest
presence of malignancy.

On physical exam, there is a firm area in the LEFT upper inner
breast at the site of palpable concern.

Targeted ultrasound was performed of the site of palpable concern.
There is an echogenic linear focus with posterior acoustic shadowing
consistent with patient's cardiac loop recorder at the site of
palpable concern. No suspicious cystic or solid mass is seen in the
region.
IMPRESSION: 1. Patient's cardiac loop recorder is at the site of palpable
concern. No mammographic or sonographic evidence of malignancy.

RECOMMENDATION:
Recommend return to annual screening mammography, due November 2021

I have discussed the findings and recommendations with the patient.
If applicable, a reminder letter will be sent to the patient
regarding the next appointment.

BI-RADS CATEGORY  2: Benign.

## 2023-05-29 DIAGNOSIS — E119 Type 2 diabetes mellitus without complications: Secondary | ICD-10-CM | POA: Diagnosis not present

## 2023-05-29 DIAGNOSIS — I129 Hypertensive chronic kidney disease with stage 1 through stage 4 chronic kidney disease, or unspecified chronic kidney disease: Secondary | ICD-10-CM | POA: Diagnosis not present

## 2023-05-29 DIAGNOSIS — N2581 Secondary hyperparathyroidism of renal origin: Secondary | ICD-10-CM | POA: Diagnosis not present

## 2023-05-29 DIAGNOSIS — I10 Essential (primary) hypertension: Secondary | ICD-10-CM | POA: Diagnosis not present

## 2023-05-29 DIAGNOSIS — D631 Anemia in chronic kidney disease: Secondary | ICD-10-CM | POA: Diagnosis not present

## 2023-06-11 ENCOUNTER — Other Ambulatory Visit: Payer: Self-pay | Admitting: Physician Assistant

## 2023-06-11 DIAGNOSIS — E119 Type 2 diabetes mellitus without complications: Secondary | ICD-10-CM | POA: Diagnosis not present

## 2023-06-11 DIAGNOSIS — Z9049 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: Secondary | ICD-10-CM | POA: Diagnosis not present

## 2023-06-11 DIAGNOSIS — E785 Hyperlipidemia, unspecified: Secondary | ICD-10-CM | POA: Diagnosis not present

## 2023-06-11 DIAGNOSIS — D631 Anemia in chronic kidney disease: Secondary | ICD-10-CM | POA: Diagnosis not present

## 2023-06-11 DIAGNOSIS — K219 Gastro-esophageal reflux disease without esophagitis: Secondary | ICD-10-CM

## 2023-06-11 DIAGNOSIS — N1832 Chronic kidney disease, stage 3b: Secondary | ICD-10-CM | POA: Diagnosis not present

## 2023-06-11 DIAGNOSIS — N2581 Secondary hyperparathyroidism of renal origin: Secondary | ICD-10-CM | POA: Insufficient documentation

## 2023-06-11 DIAGNOSIS — I10 Essential (primary) hypertension: Secondary | ICD-10-CM | POA: Diagnosis not present

## 2023-06-11 DIAGNOSIS — R8290 Unspecified abnormal findings in urine: Secondary | ICD-10-CM | POA: Diagnosis not present

## 2023-06-11 DIAGNOSIS — R809 Proteinuria, unspecified: Secondary | ICD-10-CM | POA: Diagnosis not present

## 2023-06-11 DIAGNOSIS — Z9884 Bariatric surgery status: Secondary | ICD-10-CM | POA: Diagnosis not present

## 2023-06-11 DIAGNOSIS — N9419 Other specified dyspareunia: Secondary | ICD-10-CM

## 2023-06-11 DIAGNOSIS — M5416 Radiculopathy, lumbar region: Secondary | ICD-10-CM

## 2023-06-11 DIAGNOSIS — E876 Hypokalemia: Secondary | ICD-10-CM | POA: Diagnosis not present

## 2023-06-11 DIAGNOSIS — D509 Iron deficiency anemia, unspecified: Secondary | ICD-10-CM | POA: Diagnosis not present

## 2023-06-11 DIAGNOSIS — E1122 Type 2 diabetes mellitus with diabetic chronic kidney disease: Secondary | ICD-10-CM | POA: Diagnosis not present

## 2023-06-11 DIAGNOSIS — E440 Moderate protein-calorie malnutrition: Secondary | ICD-10-CM

## 2023-06-12 NOTE — Telephone Encounter (Signed)
Requested Prescriptions  Pending Prescriptions Disp Refills   omeprazole (PRILOSEC) 20 MG capsule [Pharmacy Med Name: omeprazole 20 mg capsule,delayed release] 90 capsule 1    Sig: TAKE ONE CAPSULE BY MOUTH ONCE DAILY     Gastroenterology: Proton Pump Inhibitors Passed - 06/11/2023  2:36 PM      Passed - Valid encounter within last 12 months    Recent Outpatient Visits           3 weeks ago Type 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, without long-term current use of insulin (HCC)   [place] [facility] [place], [place], PA-C   2 months ago Other microscopic hematuria   [place] [facility] [place], [place], PA-C   3 months ago Benign essential HTN   [place] [facility] [place], [place], PA-C   4 months ago Left leg swelling   [place] [facility] [place], [place], MD   5 months ago Type 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, without long-term current use of insulin (HCC)   [place] [facility] [place], [place], PA-C       Future Appointments             In 2 months Ostwalt, Friona, PA-C [place] [facility]             gabapentin (NEURONTIN) 300 MG capsule [employer] Med Name: gabapentin 300 mg capsule] 270 capsule 1    Sig: TAKE ONE CAPSULE BY MOUTH THREE TIMES DAILY     Neurology: Anticonvulsants - gabapentin Failed - 06/11/2023  2:36 PM      Failed - Cr in normal range and within 360 days    Creat  Date Value Ref Range Status  10/30/2017 1.35 (H) 0.50 - 0.99 mg/dL Final    Comment:    For patients >83 years of age, the reference limit for Creatinine is approximately 13% higher for people identified as African-American. .    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, without long-term current use of insulin (HCC)   [place] [facility] [place], [place], PA-C   2 months ago Other microscopic hematuria   [place] [facility] [place], [place], PA-C   3 months ago Benign essential HTN   [place] [facility] [place], [place], PA-C   4 months ago Left leg swelling   [place] [facility] [place], [place], MD   5 months ago Type 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, without long-term current use of insulin (HCC)   [place] [facility] [place], [place], PA-C       Future Appointments             In 2 months Ostwalt, [place], PA-C  [facility]             hydrOXYzine (VISTARIL) 100 MG capsule [Pharmacy Med Name: hydroxyzine pamoate 100 mg capsule] 270 capsule 1    Sig: TAKE ONE CAPSULE BY MOUTH THREE TIMES DAILY  Ear, Nose, and Throat:  Antihistamines 2 Failed - 06/11/2023  2:36 PM      Failed - Cr in normal range and within 360 days    Creat  Date Value Ref Range Status  10/30/2017 1.35 (H) 0.50 - 0.99 mg/dL Final    Comment:    For patients >78 years of age, the reference limit for Creatinine is approximately 13% higher for people identified as African-American. .    Creatinine, Ser  Date Value Ref Range Status  11/20/2022 1.46 (H) 0.44 - 1.00 mg/dL Final         Passed - Valid encounter within last 12 months    Recent Outpatient Visits           3 weeks ago Type 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, without long-term current use of insulin (HCC)   [place] [facility] [place], [place], PA-C   2 months ago Other microscopic hematuria   [place] [place], [place], PA-C   3 months ago Benign essential HTN    [place] [facility] [place], [place], PA-C   4 months ago Left leg swelling   [place] [facility] [place], [place], MD   5 months ago Type 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, without long-term current use of insulin (HCC)   [place] [facility] [place], [place], PA-C       Future Appointments             In 2 months Ostwalt, [place], PA-C [place] [facility]             mirtazapine (REMERON) 30 MG tablet [employer] Med Name: mirtazapine 30 mg tablet] 30 tablet 2    Sig: Take 1 tablet by mouth once daily at bedtime     Psychiatry: Antidepressants - mirtazapine Passed - 06/11/2023  2:36 PM      Passed - Valid encounter within last 6 months    Recent Outpatient Visits           3 weeks ago Type 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, without long-term current use of insulin (HCC)   [place] [facility] [place], [place], PA-C   2 months ago Other microscopic hematuria   [place] [facility] [place], [place], PA-C   3 months ago Benign essential HTN   [place] [facility] [place], [place], PA-C   4 months ago Left leg swelling   [place] [facility] [place], [place], MD   5 months ago Type 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, without long-term current use of insulin (HCC)   [place] [facility] [place], [place], PA-C       Future Appointments             In 2 months Ostwalt, Edmon [place], PA-C  [facility]

## 2023-06-17 NOTE — Progress Notes (Addendum)
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 as prolonged bleeding, low or poor functioning platelets, bruising or bleeding easily, hereditary bleeding problems, low energy levels due to low hemoglobin or being anemic Endocrine: Slow thyroid Rheumatologic: Rheumatoid arthritis Musculoskeletal: Negative for myasthenia gravis, muscular dystrophy, multiple sclerosis or malignant hyperthermia Work History: Retired  Allergies  Ms. Deblase is allergic to lotrel [amlodipine besy-benazepril hcl], contrast media [iodinated contrast media], niacin, and sulfa antibiotics.  Laboratory Chemistry Profile   Renal Lab Results  Component Value Date   BUN 11 06/19/2023   CREATININE 1.35 (H) 06/19/2023   BCR 9 (L) 09/13/2021   GFRAA 42 (L) 12/05/2020   GFRNONAA 42 (L) 06/19/2023   SPECGRAV >=1.030 (A) 04/11/2023   PHUR 8.0 04/11/2023   PROTEINUR  Negative 04/11/2023     Electrolytes Lab Results  Component Value Date   NA 141 06/19/2023   K 3.9 06/19/2023   CL 105 06/19/2023   CALCIUM 8.9 06/19/2023   MG 2.2 06/19/2023   PHOS 3.4 11/20/2022     Hepatic Lab Results  Component Value Date   AST 61 (H) 06/19/2023   ALT 53 (H) 06/19/2023   ALBUMIN 4.0 06/19/2023   ALKPHOS 209 (H) 06/19/2023   LIPASE 27 08/31/2022   AMMONIA 68 (H) 11/15/2012     ID Lab Results  Component Value Date   HIV Non Reactive 08/08/2022   SARSCOV2NAA NEGATIVE 09/05/2021   STAPHAUREUS NEGATIVE 03/21/2018   MRSAPCR NEGATIVE 03/21/2018     Bone Lab Results  Component Value Date   VD25OH 51.23 08/09/2022   25OHVITD1 39 06/19/2023   25OHVITD2 1.0 06/19/2023   25OHVITD3 38 06/19/2023     Endocrine Lab Results  Component Value Date   GLUCOSE 94 06/19/2023   GLUCOSEU 50 (  A) 11/19/2022   HGBA1C 5.7 (A) 04/11/2023   TSH 2.483 01/18/2023   FREET4 0.91 06/28/2021   CRTSLPL 10.9 08/11/2022     Neuropathy Lab Results  Component Value Date   VITAMINB12 303 06/19/2023   FOLATE 19.8 08/09/2022   HGBA1C 5.7 (A) 04/11/2023   HIV Non Reactive 08/08/2022     CNS No results found for: "COLORCSF", "APPEARCSF", "RBCCOUNTCSF", "WBCCSF", "POLYSCSF", "LYMPHSCSF", "EOSCSF", "PROTEINCSF", "GLUCCSF", "JCVIRUS", "CSFOLI", "IGGCSF", "LABACHR", "ACETBL"   Inflammation (CRP: Acute  ESR: Chronic) Lab Results  Component Value Date   CRP <0.5 06/19/2023   ESRSEDRATE 40 (H) 06/19/2023   LATICACIDVEN 1.1 08/21/2022     Rheumatology No results found for: "RF", "ANA", "LABURIC", "URICUR", "LYMEIGGIGMAB", "LYMEABIGMQN", "HLAB27"   Coagulation Lab Results  Component Value Date   INR 1.1 11/19/2022   LABPROT 13.8 11/19/2022   APTT 31 09/05/2021   PLT 303 03/04/2023   DDIMER 0.61 (H) 09/28/2015     Cardiovascular Lab Results  Component Value Date   BNP 40.2 09/28/2015   CKTOTAL 90 09/07/2021   CKMB 1.9 01/27/2012   TROPONINI <0.03 10/13/2018   HGB  10.5 (L) 03/04/2023   HCT 34.3 (L) 03/04/2023     Screening Lab Results  Component Value Date   SARSCOV2NAA NEGATIVE 09/05/2021   STAPHAUREUS NEGATIVE 03/21/2018   MRSAPCR NEGATIVE 03/21/2018   HIV Non Reactive 08/08/2022     Cancer No results found for: "CEA", "CA125", "LABCA2"   Allergens No results found for: "ALMOND", "APPLE", "ASPARAGUS", "AVOCADO", "BANANA", "BARLEY", "BASIL", "BAYLEAF", "GREENBEAN", "LIMABEAN", "WHITEBEAN", "BEEFIGE", "REDBEET", "BLUEBERRY", "BROCCOLI", "CABBAGE", "MELON", "CARROT", "CASEIN", "CASHEWNUT", "CAULIFLOWER", "CELERY"     Note: Lab results reviewed.  PFSH  Drug: Ms. Bolhuis  reports no history of drug use. Alcohol:  reports no history of alcohol use. Tobacco:  reports that she has never smoked. She has never used smokeless tobacco. Medical:  has a past medical history of Anemia, Arthritis, COVID-19 (11/19/2019), Diverticulitis, DM (diabetes mellitus) (HCC), History of methicillin resistant staphylococcus aureus (MRSA) (2017), HTN (hypertension), Hyperlipidemia, L-S radiculopathy (06/11/2015), Left leg swelling (01/22/2023), Lower abdominal pain (08/08/2022), Memory difficulties (09/01/2015), Multiple thyroid nodules, Myocardial infarction (HCC) (1995), Short-term memory loss, and Thyroid nodule. Family: family history includes Breast cancer in her paternal grandmother; Breast cancer (age of onset: 28) in her paternal aunt; Breast cancer (age of onset: 17) in her paternal aunt; COPD in her mother; Cancer in her paternal aunt and paternal grandmother; Cancer (age of onset: 74) in her father; Congestive Heart Failure in her maternal grandmother; Coronary artery disease (age of onset: 47) in her father; Diabetes in her mother; Emphysema in her maternal grandfather; Heart attack in her paternal grandfather; Hypertension in her mother.  Past Surgical History:  Procedure Laterality Date   ABDOMINAL HYSTERECTOMY     due to endometriosis-1 ovary left   BREAST  EXCISIONAL BIOPSY Right 1971   neg   CARDIAC CATHETERIZATION  2000   no significant CAD per note of Dr Graciela Husbands in 2013    CHOLECYSTECTOMY     CYSTOCELE REPAIR N/A 03/19/2017   Procedure: ANTERIOR REPAIR (CYSTOCELE);  Surgeon: Alfredo Martinez, MD;  Location: [facility];  Service: Urology;  Laterality: N/A;   CYSTOSCOPY N/A 03/19/2017   Procedure: CYSTOSCOPY;  Surgeon: Alfredo Martinez, MD;  Location: [facility];  Service: Urology;  Laterality: N/A;   ESOPHAGOGASTRODUODENOSCOPY (EGD) WITH PROPOFOL N/A 12/15/2021   Procedure: ESOPHAGOGASTRODUODENOSCOPY (EGD) WITH PROPOFOL;  Surgeon: Regis Bill, MD;  Location: [facility];  Service: Endoscopy;  Laterality: N/A;  EYE SURGERY     FOOT SURGERY     HAND SURGERY     HEMICOLECTOMY     INSERTION OF MESH N/A 08/21/2016   Procedure: INSERTION OF MESH;  Surgeon: Glenna Fellows, MD;  Location: [facility];  Service: General;  Laterality: N/A;   INSERTION OF MESH N/A 12/08/2019   Procedure: INSERTION OF MESH;  Surgeon: Leafy Ro, MD;  Location: [facility];  Service: General;  Laterality: N/A;   LAPAROSCOPIC LYSIS OF ADHESIONS N/A 08/21/2016   Procedure: LAPAROSCOPIC LYSIS OF ADHESIONS;  Surgeon: Glenna Fellows, MD;  Location: [facility];  Service: General;  Laterality: N/A;   LAPAROSCOPIC REMOVAL ABDOMINAL MASS     LAPAROTOMY N/A 04/16/2019   Procedure: EXPLORATORY LAPAROTOMY;  Surgeon: Sung Amabile, DO;  Location: [facility];  Service: General;  Laterality: N/A;   LOOP RECORDER INSERTION N/A 03/21/2018   Procedure: LOOP RECORDER INSERTION;  Surgeon: Thurmon Fair, MD;  Location: [facility];  Service: Cardiovascular;  Laterality: N/A;   LUMBAR DISC SURGERY     ROUX-EN-Y GASTRIC BYPASS  2010   Dr Daphine Deutscher   SMALL INTESTINE SURGERY     TONSILLECTOMY     UPPER GI ENDOSCOPY  01/12/2016   normal larynx, normal esophagus. gastric bypass with a normal-sized pouch and intact staple line, normal examined jejunum, otherwise exam was normal   VENTRAL  HERNIA REPAIR N/A 08/21/2016   Procedure: LAPAROSCOPIC VENTRAL HERNIA;  Surgeon: Glenna Fellows, MD;  Location: [facility];  Service: General;  Laterality: N/A;   XI ROBOTIC ASSISTED VENTRAL HERNIA N/A 12/08/2019   Procedure: XI ROBOTIC ASSISTED VENTRAL HERNIA;  Surgeon: Leafy Ro, MD;  Location: [facility];  Service: General;  Laterality: N/A;   Active Ambulatory Problems    Diagnosis Date Noted   Essential hypertension 01/27/2012   Type 2 diabetes mellitus (HCC) 11/15/2012   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   Arthritis 06/11/2015   Narrowing of intervertebral disc space 07/25/2009   Chronic low back pain of over 3 months duration 06/11/2015   Hyperlipidemia 06/11/2015   Benign essential HTN 07/25/2009   Cannot sleep 06/11/2015   Arthritis of knee, degenerative 06/11/2015   B12 deficiency 06/11/2015   Avitaminosis D 11/07/2009   Memory difficulties 09/01/2015   Partial small bowel obstruction (HCC) 04/23/2016   Adjustment disorder with mixed anxiety and depressed mood 04/23/2016   SBO (small bowel obstruction) (HCC)    Ventral incisional hernia 08/21/2016   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   Exophthalmos 02/22/2018   Multiple thyroid nodules 02/22/2018   Syncope 03/18/2018   Anemia 03/18/2018   Iron deficiency anemia 11/21/2018   Acquired trigger finger 03/20/2019   Large bowel obstruction (HCC) 04/17/2019   Gastroesophageal reflux disease 04/28/2020   Low back pain radiating to lower extremity (Right) 06/23/2020   Cystitis 12/15/2020   Urinary symptom or sign 12/15/2020   Callus of foot 01/19/2021   Pes planus 01/19/2021   PTTD (posterior tibial tendon dysfunction) 01/19/2021   Stomatitis and mucositis 07/21/2021   Cheilitis 07/21/2021   TIA (transient ischemic attack) 09/05/2021   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, psychotic disturbance,  mood disturbance, or anxiety (HCC) 11/22/2021   Proteinuria, unspecified 05/04/2021   Stage 3b chronic kidney disease (HCC) 05/04/2021   Anemia in chronic kidney disease 05/04/2021   Hypokalemia 05/04/2021   Other and unspecified hyperlipidemia 03/17/2021   Hypoglycemia 08/08/2022   Diarrhea 08/08/2022  UTI (urinary tract infection) 08/08/2022   Elevated lipase 08/08/2022   Transaminitis 08/08/2022   Hypoalbuminemia 08/08/2022   Malnutrition of moderate degree 08/11/2022   Seizure (HCC) 11/19/2022   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 05/24/2022   Type 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, without long-term current use of insulin (HCC) 07/25/2009   Chronic pain syndrome 05/22/2023   Pharmacologic therapy 05/22/2023   Disorder of skeletal system 05/22/2023   Problems influencing health status 05/22/2023   Hyperparathyroidism due to renal insufficiency (HCC) 06/11/2023   Unspecified abnormal findings in urine 06/11/2023   Abnormal CT scan, cervical spine (11/19/2022) 06/19/2023   Abnormal MRI, lumbar spine (02/22/2016) 06/19/2023   DDD (degenerative disc disease), cervical 06/19/2023   DDD (degenerative disc disease), lumbar 06/19/2023   Cervical foraminal stenosis (C3-C7) 06/19/2023   Chronic low back pain (1ry area of Pain) (Bilateral) (R>L) w/o sciatica 06/19/2023   Chronic lower extremity pain (2ry area of Pain) (Right) 06/19/2023   Osteoarthritis of shoulder (Right) 06/19/2023   Osteoarthritis of knees (Bilateral) 06/19/2023   Chronic hip pain (3ry area of Pain) (Right) 06/19/2023   Lumbar facet joint pain 06/19/2023   Lumbar facet joint syndrome 06/19/2023   Arthropathy of hip (Right) 06/19/2023   Body mass index (BMI) of 24.0 to 24.9 in adult 06/27/2023   Resolved Ambulatory Problems    Diagnosis Date Noted   DM (diabetes mellitus) (HCC) 01/27/2012   Chest pain, localized 01/27/2012   Acute encephalopathy 11/15/2012    Dysphasia 11/15/2012   Diabetes (HCC) 07/25/2009   Decreased potassium in the blood 06/11/2015   L-S radiculopathy 06/11/2015   Adiposity 11/21/2009   Acute flank pain 12/15/2020   Lower abdominal pain 08/08/2022   Left leg swelling 01/22/2023   Past Medical History:  Diagnosis Date   COVID-19 11/19/2019   Diverticulitis    History of methicillin resistant staphylococcus aureus (MRSA) 2017   HTN (hypertension)    Myocardial infarction (HCC) 1995   Short-term memory loss    Thyroid nodule    Constitutional Exam  General appearance: Well nourished, well developed, and well hydrated. In no apparent acute distress Vitals:   06/19/23 1026  BP: 133/69  Pulse: 74  Temp: (!) 97.3 F (36.3 C)  SpO2: 100%  Weight: 140 lb (63.5 kg)  Height: 5\' 3"  (1.6 m)   BMI Assessment: Estimated body mass index is 24.8 kg/m as calculated from the following:   Height as of this encounter: 5\' 3"  (1.6 m).   Weight as of this encounter: 140 lb (63.5 kg).  BMI interpretation table: BMI level Category Range association with higher incidence of chronic pain  <18 kg/m2 Underweight   18.5-24.9 kg/m2 Ideal body weight   25-29.9 kg/m2 Overweight Increased incidence by 20%  30-34.9 kg/m2 Obese (Class I) Increased incidence by 68%  35-39.9 kg/m2 Severe obesity (Class II) Increased incidence by 136%  >40 kg/m2 Extreme obesity (Class III) Increased incidence by 254%   Patient's current BMI Ideal Body weight  Body mass index is 24.8 kg/m. Ideal body weight: 52.4 kg (115 lb 8.3 oz) Adjusted ideal body weight: 56.8 kg (125 lb 5 oz)   BMI Readings from Last 4 Encounters:  06/19/23 24.80 kg/m  05/16/23 24.92 kg/m  04/11/23 24.09 kg/m  04/02/23 24.27 kg/m   Wt Readings from Last 4 Encounters:  06/19/23 140 lb (63.5 kg)  05/16/23 140 lb 11.2 oz (63.8 kg)  04/11/23 136 lb (61.7 kg)  04/02/23 137 lb (62.1  kg)    Psych/Mental status: Alert, oriented x 3 (person, place, & time)       Eyes:  PERLA Respiratory: No evidence of acute respiratory distress  Assessment  Primary Diagnosis & Pertinent Problem List: The primary encounter diagnosis was Chronic pain syndrome. Diagnoses of Chronic low back pain (1ry area of Pain) (Bilateral) (R>L) w/o sciatica, Chronic lower extremity pain (2ry area of Pain) (Right), Chronic low back pain of over 3 months duration, Chronic hip pain (3ry area of Pain) (Right), Arthropathy of hip (Right), Lumbar facet joint pain, Lumbar facet joint syndrome, DDD (degenerative disc disease), lumbar, Failed back surgical syndrome, Abnormal MRI, lumbar spine (02/22/2016), Osteoarthritis of knees (Bilateral), DDD (degenerative disc disease), cervical, Cervical foraminal stenosis (C3-C7), Abnormal CT scan, cervical spine (11/19/2022), Osteoarthritis of shoulder (Right), Pharmacologic therapy, Chronic use of opiate for therapeutic purpose, Disorder of skeletal system, Problems influencing health status, B12 deficiency, and Body mass index (BMI) of 24.0 to 24.9 in adult were also pertinent to this visit.  Visit Diagnosis (New problems to examiner): 1. Chronic pain syndrome   2. Chronic low back pain (1ry area of Pain) (Bilateral) (R>L) w/o sciatica   3. Chronic lower extremity pain (2ry area of Pain) (Right)   4. Chronic low back pain of over 3 months duration   5. Chronic hip pain (3ry area of Pain) (Right)   6. Arthropathy of hip (Right)   7. Lumbar facet joint pain   8. Lumbar facet joint syndrome   9. DDD (degenerative disc disease), lumbar   10. Failed back surgical syndrome   11. Abnormal MRI, lumbar spine (02/22/2016)   12. Osteoarthritis of knees (Bilateral)   13. DDD (degenerative disc disease), cervical   14. Cervical foraminal stenosis (C3-C7)   15. Abnormal CT scan, cervical spine (11/19/2022)   16. Osteoarthritis of shoulder (Right)   17. Pharmacologic therapy   18. Chronic use of opiate for therapeutic purpose   19. Disorder of skeletal system   20.  Problems influencing health status   21. B12 deficiency   22. Body mass index (BMI) of 24.0 to 24.9 in adult    Plan of Care (Initial workup plan)  Note: Ms. Ellifritz was reminded that as per protocol, today's visit has been an evaluation only. We have not taken over the patient's controlled substance management.  Problem-specific plan: No problem-specific Assessment & Plan notes found for this encounter.  Lab Orders         Compliance Drug Analysis, Ur         Comp. Metabolic Panel (12)         Magnesium         Vitamin B12         Sedimentation rate         25-Hydroxy vitamin D Lcms D2+D3         C-reactive protein     Imaging Orders         DG Lumbar Spine Complete W/Bend         DG HIP UNILAT W OR W/O PELVIS 2-3 VIEWS RIGHT     Referral Orders  No referral(s) requested today   Procedure Orders    No procedure(s) ordered today   Pharmacotherapy (current): Medications ordered:  No orders of the defined types were placed in this encounter.  Medications administered during this visit: [name] had no medications administered during this visit.   Analgesic Pharmacotherapy:  Opioid Analgesics: For patients currently taking or requesting to take opioid  analgesics, in accordance with [facility] Guidelines, we will assess their risks and indications for the use of these substances. After completing our evaluation, we may offer recommendations, but we no longer take patients for medication management. The prescribing physician will ultimately decide, based on his/her training and level of comfort whether to adopt any of the recommendations, including whether or not to prescribe such medicines.  Membrane stabilizer: To be determined at a later time  Muscle relaxant: To be determined at a later time  NSAID: To be determined at a later time  Other analgesic(s): To be determined at a later time   Interventional management options: Ms. Lamay was informed that  there is no guarantee that she would be a candidate for interventional therapies. The decision will be based on the results of diagnostic studies, as well as Ms. Recht's risk profile.  Procedure(s) under consideration:  Pending results of ordered studies      Interventional Therapies  Risk Factors  Considerations:  Cardiologist: Dorothyann Peng, MD ([facility])  Neurologist: Cristopher Peru, MD [facility] neurology) Hx of MI (1995)  Hx of TIAs  T2DM  Stage 3b CKD  [name]  Memory impairment  Hx of Bowel Obstruction (04/2019)  GERD  s/p Gastric Bypass for obesity  Malnutrition-malabsorption  Hx. Seizures   Planned  Pending:      Under consideration:      Completed:   None at this time   Therapeutic  Palliative (PRN) options:   None established   Completed by other providers:   Therapeutic/diagnostic right L5 TFESI x2 (04/11/2016, 05/02/2016) by Merri Ray, DO [facility])  Therapeutic/diagnostic right S1 TFESI x2 (04/11/2016, 05/02/2016) by Merri Ray, DO [facility] PMR)  Therapeutic/diagnostic right L3 TFESI x1 (07/19/2016) by Merri Ray, DO [facility] PMR)       Provider-requested follow-up: Return in about 2 weeks (around 07/03/2023) for ( ), Eval-day (M,W), (F2F), 2nd Visit, for review of ordered tests.  Future Appointments  Date Time Provider Department Center  07/10/2023  9:45 AM Edwin Cap, DPM [facility]  07/29/2023 10:00 AM Delano Metz, MD [facility] None  08/16/2023 10:00 AM [name], PA-C [facility]  12/02/2023  2:00 PM Croitoru, Rachelle Hora, MD [facility] None  04/06/2024  8:15 AM [facility]AL WELLNESS VISIT [facility]    Duration of encounter: 68 minutes.  Total time on encounter, as per AMA guidelines included both the face-to-face and non-face-to-face time personally spent by the physician and/or other qualified health care professional(s) on the day of the encounter (includes time in activities that require the physician  or other qualified health care professional and does not include time in activities normally performed by clinical staff). Physician's time may include the following activities when performed: Preparing to see the patient (e.g., pre-charting review of records, searching for previously ordered imaging, lab work, and nerve conduction tests) Review of prior analgesic pharmacotherapies. Reviewing PMP Interpreting ordered tests (e.g., lab work, imaging, nerve conduction tests) Performing post-procedure evaluations, including interpretation of diagnostic procedures Obtaining and/or reviewing separately obtained history Performing a medically appropriate examination and/or evaluation Counseling and educating the patient/family/caregiver Ordering medications, tests, or procedures Referring and communicating with other health care professionals (when not separately reported) Documenting clinical information in the electronic or other health record Independently interpreting results (not separately reported) and communicating results to the patient/ family/caregiver Care coordination (not separately reported)  Note by: Oswaldo Done, MD (TTS technology used. I apologize for any typographical errors that were not detected and  corrected.) Date: 06/19/2023; Time: 3:59 PM

## 2023-06-19 ENCOUNTER — Encounter: Payer: Self-pay | Admitting: Pain Medicine

## 2023-06-19 ENCOUNTER — Ambulatory Visit
Admission: RE | Admit: 2023-06-19 | Discharge: 2023-06-19 | Disposition: A | Discharge status: Home / Self Care | Payer: 59 | Source: Ambulatory Visit | Attending: Pain Medicine | Admitting: Pain Medicine

## 2023-06-19 ENCOUNTER — Ambulatory Visit (HOSPITAL_BASED_OUTPATIENT_CLINIC_OR_DEPARTMENT_OTHER): Payer: 59 | Admitting: Pain Medicine

## 2023-06-19 ENCOUNTER — Other Ambulatory Visit
Admission: RE | Admit: 2023-06-19 | Discharge: 2023-06-19 | Disposition: A | Discharge status: Home / Self Care | Payer: 59 | Source: Ambulatory Visit | Attending: Internal Medicine | Admitting: Internal Medicine

## 2023-06-19 VITALS — BP 133/69 | HR 74 | Temp 97.3°F | Ht 63.0 in | Wt 140.0 lb

## 2023-06-19 DIAGNOSIS — Z6824 Body mass index (BMI) 24.0-24.9, adult: Secondary | ICD-10-CM

## 2023-06-19 DIAGNOSIS — G8929 Other chronic pain: Secondary | ICD-10-CM

## 2023-06-19 DIAGNOSIS — M5136 Other intervertebral disc degeneration, lumbar region: Secondary | ICD-10-CM

## 2023-06-19 DIAGNOSIS — M961 Postlaminectomy syndrome, not elsewhere classified: Secondary | ICD-10-CM

## 2023-06-19 DIAGNOSIS — M19011 Primary osteoarthritis, right shoulder: Secondary | ICD-10-CM

## 2023-06-19 DIAGNOSIS — Z789 Other specified health status: Secondary | ICD-10-CM | POA: Insufficient documentation

## 2023-06-19 DIAGNOSIS — M5459 Other low back pain: Secondary | ICD-10-CM | POA: Insufficient documentation

## 2023-06-19 DIAGNOSIS — M4802 Spinal stenosis, cervical region: Secondary | ICD-10-CM

## 2023-06-19 DIAGNOSIS — M5030 Other cervical disc degeneration, unspecified cervical region: Secondary | ICD-10-CM | POA: Insufficient documentation

## 2023-06-19 DIAGNOSIS — M170 Bilateral primary osteoarthritis of knee: Secondary | ICD-10-CM

## 2023-06-19 DIAGNOSIS — E538 Deficiency of other specified B group vitamins: Secondary | ICD-10-CM | POA: Insufficient documentation

## 2023-06-19 DIAGNOSIS — R937 Abnormal findings on diagnostic imaging of other parts of musculoskeletal system: Secondary | ICD-10-CM

## 2023-06-19 DIAGNOSIS — M1611 Unilateral primary osteoarthritis, right hip: Secondary | ICD-10-CM | POA: Insufficient documentation

## 2023-06-19 DIAGNOSIS — E669 Obesity, unspecified: Secondary | ICD-10-CM | POA: Insufficient documentation

## 2023-06-19 DIAGNOSIS — F1190 Opioid use, unspecified, uncomplicated: Secondary | ICD-10-CM | POA: Insufficient documentation

## 2023-06-19 DIAGNOSIS — M4316 Spondylolisthesis, lumbar region: Secondary | ICD-10-CM | POA: Insufficient documentation

## 2023-06-19 DIAGNOSIS — Z79891 Long term (current) use of opiate analgesic: Secondary | ICD-10-CM

## 2023-06-19 DIAGNOSIS — M545 Low back pain, unspecified: Secondary | ICD-10-CM

## 2023-06-19 DIAGNOSIS — K909 Intestinal malabsorption, unspecified: Secondary | ICD-10-CM | POA: Insufficient documentation

## 2023-06-19 DIAGNOSIS — M79604 Pain in right leg: Secondary | ICD-10-CM | POA: Insufficient documentation

## 2023-06-19 DIAGNOSIS — M51369 Other intervertebral disc degeneration, lumbar region without mention of lumbar back pain or lower extremity pain: Secondary | ICD-10-CM

## 2023-06-19 DIAGNOSIS — Z79899 Other long term (current) drug therapy: Secondary | ICD-10-CM | POA: Insufficient documentation

## 2023-06-19 DIAGNOSIS — M47816 Spondylosis without myelopathy or radiculopathy, lumbar region: Secondary | ICD-10-CM | POA: Insufficient documentation

## 2023-06-19 DIAGNOSIS — M25551 Pain in right hip: Secondary | ICD-10-CM | POA: Insufficient documentation

## 2023-06-19 DIAGNOSIS — M899 Disorder of bone, unspecified: Secondary | ICD-10-CM

## 2023-06-19 DIAGNOSIS — G894 Chronic pain syndrome: Secondary | ICD-10-CM | POA: Insufficient documentation

## 2023-06-19 LAB — COMPREHENSIVE METABOLIC PANEL
ALT: 53 U/L — ABNORMAL HIGH (ref 0–44)
AST: 61 U/L — ABNORMAL HIGH (ref 15–41)
Albumin: 4 g/dL (ref 3.5–5.0)
Alkaline Phosphatase: 209 U/L — ABNORMAL HIGH (ref 38–126)
Anion gap: 8 (ref 5–15)
BUN: 11 mg/dL (ref 8–23)
CO2: 28 mmol/L (ref 22–32)
Calcium: 8.9 mg/dL (ref 8.9–10.3)
Chloride: 105 mmol/L (ref 98–111)
Creatinine, Ser: 1.35 mg/dL — ABNORMAL HIGH (ref 0.44–1.00)
GFR, Estimated: 42 mL/min — ABNORMAL LOW (ref >60)
Glucose, Bld: 94 mg/dL (ref 70–99)
Potassium: 3.9 mmol/L (ref 3.5–5.1)
Sodium: 141 mmol/L (ref 135–145)
Total Bilirubin: 0.5 mg/dL (ref 0.3–1.2)
Total Protein: 7.5 g/dL (ref 6.5–8.1)

## 2023-06-19 LAB — MAGNESIUM: Magnesium: 2.2 mg/dL (ref 1.7–2.4)

## 2023-06-19 LAB — C-REACTIVE PROTEIN: CRP: <0.5 mg/dL (ref <1.0)

## 2023-06-19 LAB — SEDIMENTATION RATE: Sed Rate: 40 mm/hr — ABNORMAL HIGH (ref 0–30)

## 2023-06-19 LAB — VITAMIN B12: Vitamin B-12: 303 pg/mL (ref 180–914)

## 2023-06-19 NOTE — Progress Notes (Signed)
Safety precautions to be maintained throughout the outpatient stay will include: orient to surroundings, keep bed in low position, maintain call bell within reach at all times, provide assistance with transfer out of bed and ambulation.  

## 2023-06-24 LAB — COMPLIANCE DRUG ANALYSIS, UR

## 2023-06-24 IMAGING — CT CT NECK W/O CM
3 of 4 series · 12 of 34 positions shown, 14 images · non-contrast
Comparison: None.

CLINICAL DATA: Throat pain

EXAM:
CT NECK WITHOUT CONTRAST
TECHNIQUE: Multidetector CT imaging of the neck was performed following the
standard protocol without intravenous contrast.

[Series 5: sag neck · sagittal · 0.42mm/px · 5 of 102 slices shown, 6 images]
[im 34/102  bone]
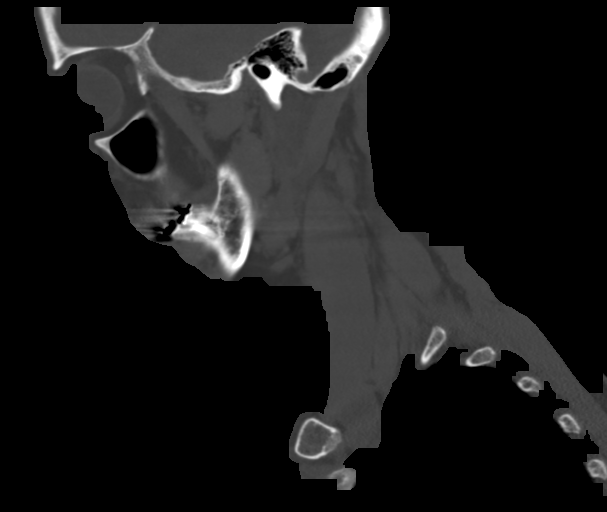
[im 43/102  bone]
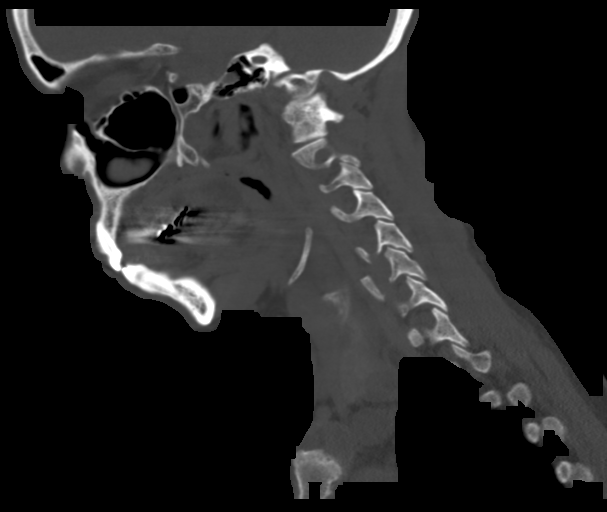
[im 51/102  soft-tissue]
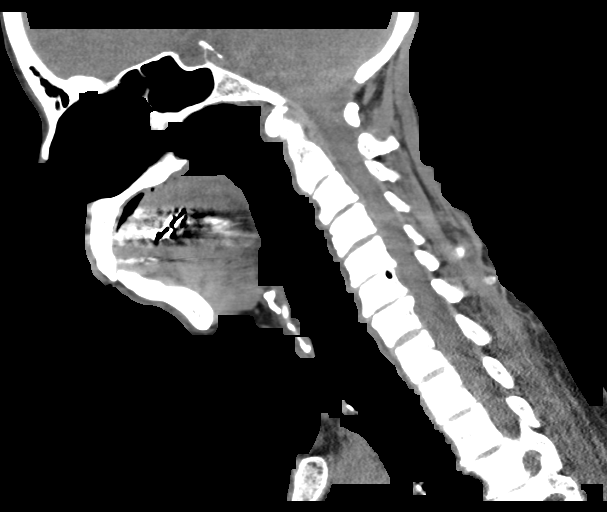
[im 51/102  bone]
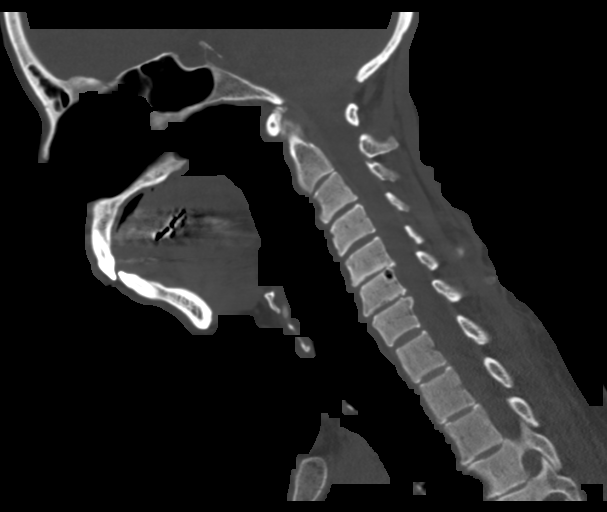
[im 59/102  bone]
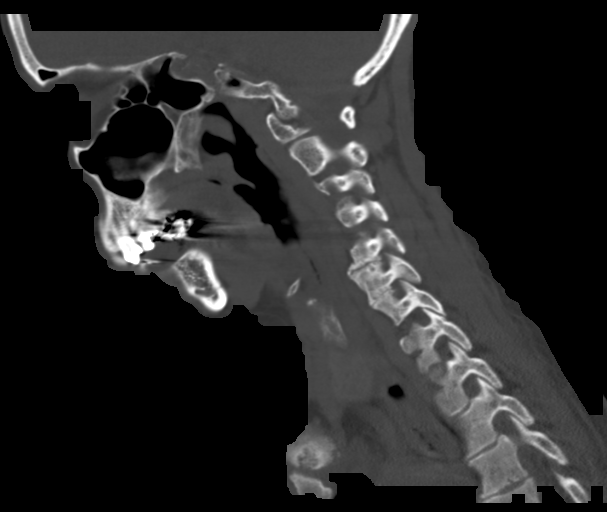
[im 68/102  bone]
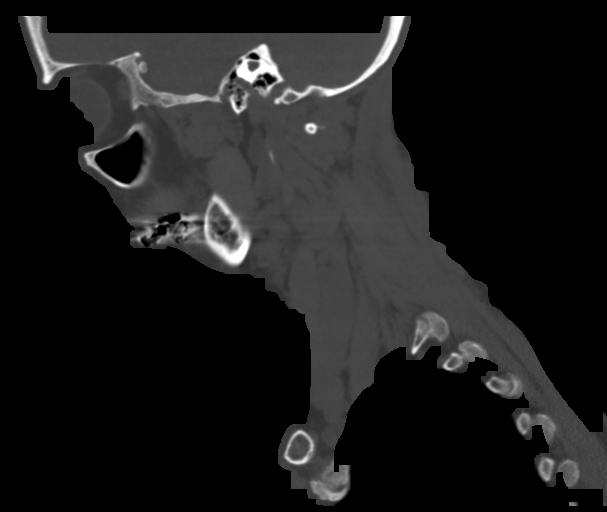

[Series 6: cor neck · coronal · 0.35mm/px · 3 of 111 slices shown]
[im 41/111  bone]
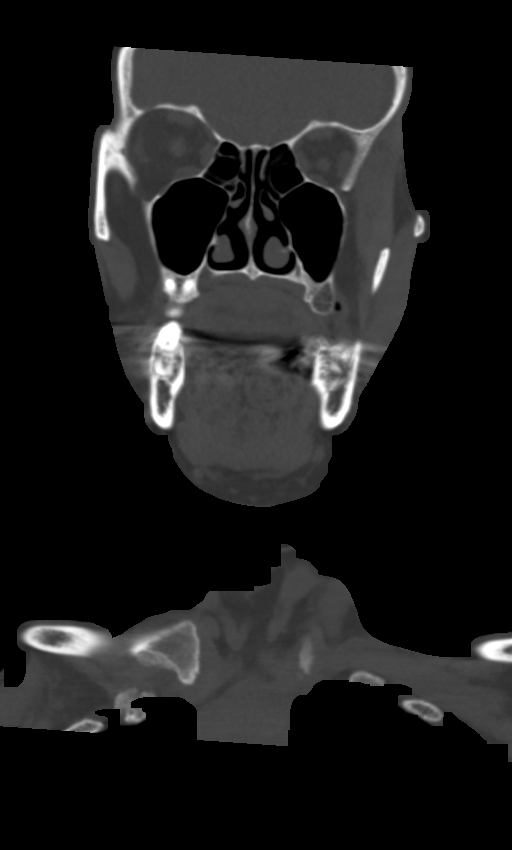
[im 51/111  bone]
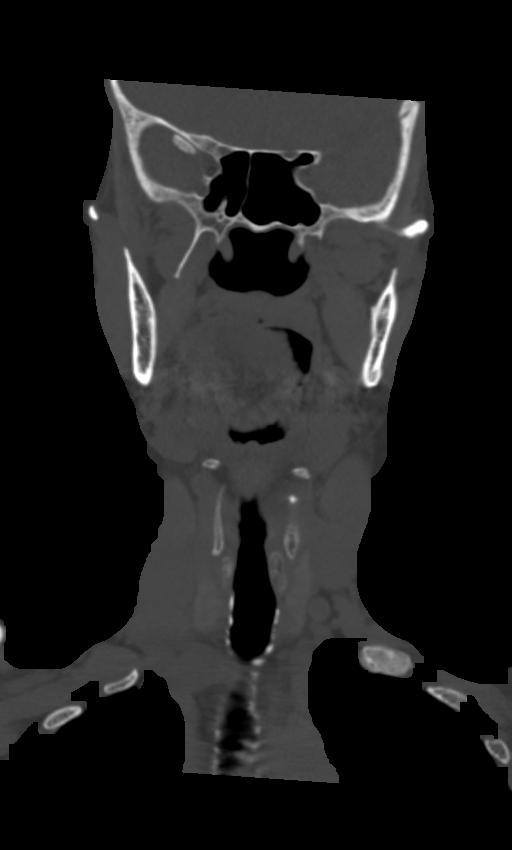
[im 60/111  bone]
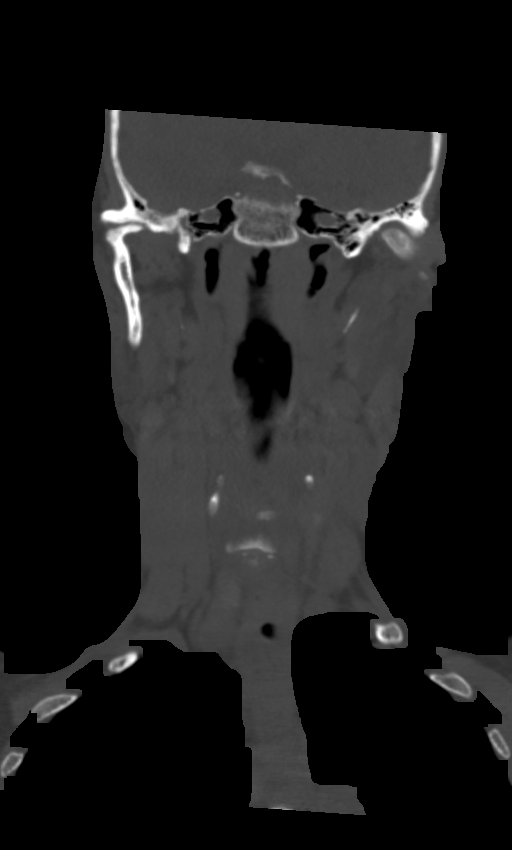

[Series 7: orthogonal ax · axial · 0.33mm/px · z∈[-260,-71]mm · 4 of 152 slices shown, 5 images]
[im 22/152  soft-tissue]
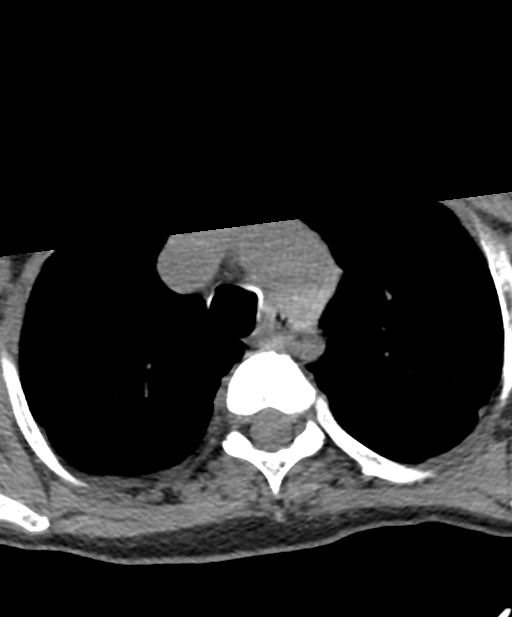
[im 22/152  bone]
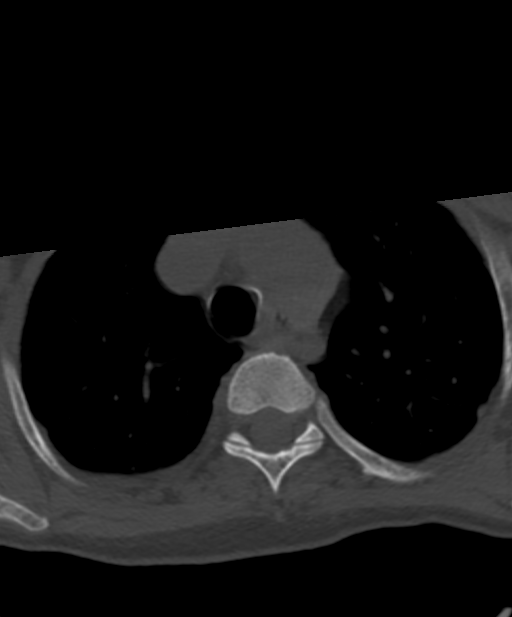
[im 65/152  bone]
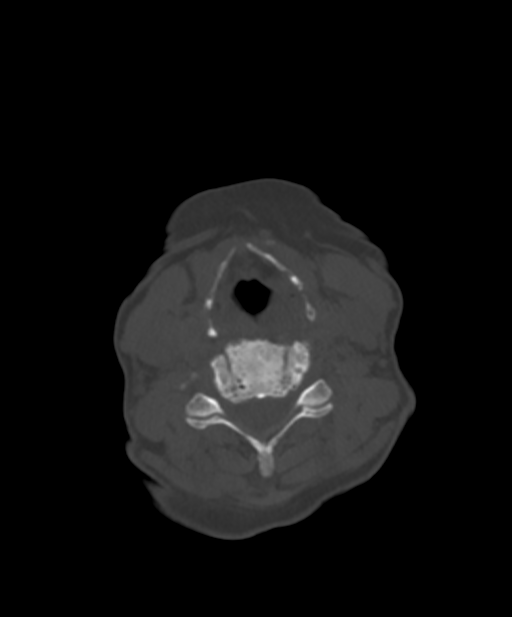
[im 87/152  bone]
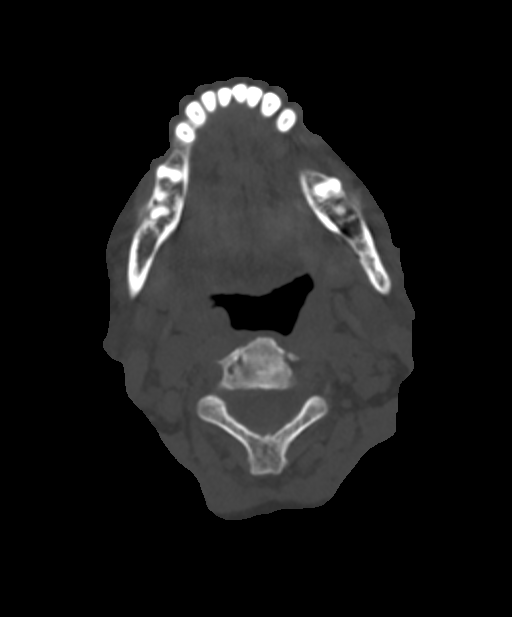
[im 130/152  bone]
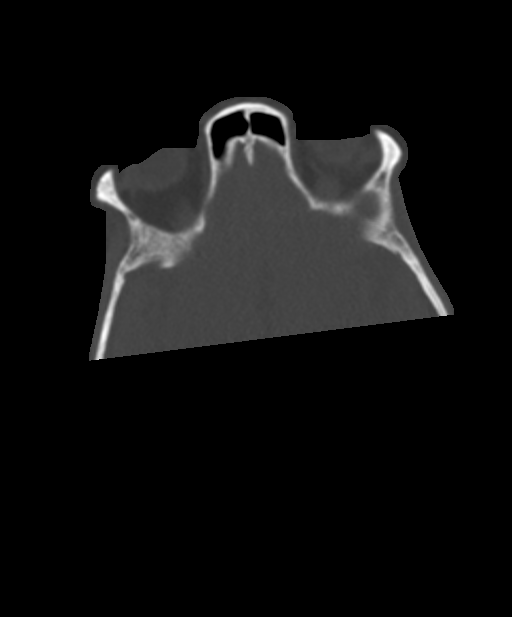

[12 of 34 positions shown; findings below may reference images not displayed]

FINDINGS: PHARYNX AND LARYNX: The nasopharynx, oropharynx and larynx are
normal. Visible portions of the oral cavity, tongue base and floor
of mouth are normal. Normal epiglottis, vallecula and pyriform
sinuses. The larynx is normal. No retropharyngeal abscess, effusion
or lymphadenopathy. There is moderate wall thickening of the upper
thoracic esophagus.

SALIVARY GLANDS: Normal parotid, submandibular and sublingual
glands.

THYROID: 1.9 cm right thyroid nodule.

LYMPH NODES: No enlarged or abnormal density lymph nodes.

VASCULAR: Negative

LIMITED INTRACRANIAL: Normal.

VISUALIZED ORBITS: Normal.

MASTOIDS AND VISUALIZED PARANASAL SINUSES: No fluid levels or
advanced mucosal thickening. No mastoid effusion.

SKELETON: No bony spinal canal stenosis. No lytic or blastic
lesions.

UPPER CHEST: Clear.

OTHER: None.
IMPRESSION: 1. Moderate wall thickening of the upper thoracic esophagus, which
may indicate esophagitis. Correlation with endoscopic assessment is
recommended to exclude neoplasm.
2. No airway narrowing.
3. 1.9 cm right thyroid nodule. Recommend thyroid US (ref: [HOSPITAL]. [DATE]): 143-50).

## 2023-06-27 DIAGNOSIS — Z6824 Body mass index (BMI) 24.0-24.9, adult: Secondary | ICD-10-CM | POA: Insufficient documentation

## 2023-06-27 LAB — 25-HYDROXY VITAMIN D LCMS D2+D3
25-Hydroxy, Vitamin D-2: 1 ng/mL
25-Hydroxy, Vitamin D-3: 38 ng/mL
25-Hydroxy, Vitamin D: 39 ng/mL

## 2023-07-10 ENCOUNTER — Ambulatory Visit (INDEPENDENT_AMBULATORY_CARE_PROVIDER_SITE_OTHER): Payer: 59 | Admitting: Podiatry

## 2023-07-10 ENCOUNTER — Encounter: Payer: Self-pay | Admitting: Podiatry

## 2023-07-10 VITALS — BP 138/67 | HR 59

## 2023-07-10 DIAGNOSIS — E119 Type 2 diabetes mellitus without complications: Secondary | ICD-10-CM | POA: Diagnosis not present

## 2023-07-10 DIAGNOSIS — Q828 Other specified congenital malformations of skin: Secondary | ICD-10-CM

## 2023-07-10 NOTE — Progress Notes (Signed)
  Subjective:  Patient ID: Julie Acevedo, female    DOB: 08/01/1951,  MRN: [id number]  Chief Complaint  Patient presents with   Callouses    "This foot is still giving me a fit."    72 y.o. female presents with the above complaint. History confirmed with patient.   Objective:  Physical Exam: warm, good capillary refill, no trophic changes or ulcerative lesions, normal DP and PT pulses, normal sensory exam, and painful porokeratosis submetatarsal 3  Assessment:   1. Porokeratosis   2. Type 2 diabetes mellitus without complication, without long-term current use of insulin (HCC)      Plan:  Patient was evaluated and treated and all questions answered.  All symptomatic hyperkeratoses were safely debrided with a sterile #15 blade to patient's level of comfort without incident. We discussed preventative and palliative care of these lesions including supportive and accommodative shoegear, padding, prefabricated and custom molded accommodative orthoses, use of a pumice stone and lotions/creams daily.  Salicylic acid cream applied today as well as a metatarsal pad.  Recommend she utilize these at home   Return if symptoms worsen or fail to improve.

## 2023-07-22 ENCOUNTER — Ambulatory Visit: Payer: Self-pay

## 2023-07-22 NOTE — Telephone Encounter (Signed)
Chief Complaint: Palpitations Symptoms: heart palpitations Frequency: ongoing, constant  Pertinent Negatives: Patient denies chest pain, SOB, high heart rate Disposition: [] ED /[] Urgent Care (no appt availability in office) / [] Appointment(In office/virtual)/ []  [facility]/ [x] Home Care/ [] Refused Recommended Disposition /[] [facility]/ []  Follow-up with PCP Additional Notes: Patient stated that she has been experiencing heart palpitations since last night. Asked patient to check her heart rate and it was 74. Patient states it happens a lot but she is not sure. As I reviewed care advice with the patient she stated that she drinks a lot of caffeine and she will cut back to see if it improves her symptoms. Advised patient to callback if symptoms do not improve after trying the care advice given. Patient verbalized understanding.  Reason for Disposition  Palpitations  Answer Assessment - Initial Assessment Questions 1. DESCRIPTION: "Please describe your heart rate or heartbeat that you are having" (e.g., fast/slow, regular/irregular, skipped or extra beats, "palpitations")     Palpitations and fast rate  2. ONSET: "When did it start?" (Minutes, hours or days)      Ongoing it just happening more frequently  3. DURATION: "How long does it last" (e.g., seconds, minutes, hours)     It started last night and it still feels like it now  4. PATTERN "Does it come and go, or has it been constant since it started?"  "Does it get worse with exertion?"   "Are you feeling it now?"     Constant, Yes it does get worse, I feel like my heart is pounding       6. HEART RATE: "Can you tell me your heart rate?" "How many beats in 15 seconds?"  (Note: not all patients can do this)       74 with home monitor  7. RECURRENT SYMPTOM: "Have you ever had this before?" If Yes, ask: "When was the last time?" and "What happened that time?"      Yes, it happens a lot  8. CAUSE: "What do you think is  causing the palpitations?"     I'm not sure but I don't feel good  9. CARDIAC HISTORY: "Do you have any history of heart disease?" (e.g., heart attack, angina, bypass surgery, angioplasty, arrhythmia)      High blood pressure  10. OTHER SYMPTOMS: "Do you have any other symptoms?" (e.g., dizziness, chest pain, sweating, difficulty breathing)       No  Protocols used: Heart Rate and Heartbeat Questions-A-AH

## 2023-07-25 DIAGNOSIS — M19042 Primary osteoarthritis, left hand: Secondary | ICD-10-CM | POA: Diagnosis not present

## 2023-07-28 NOTE — Progress Notes (Unsigned)
PROVIDER NOTE: Information contained herein reflects review and annotations entered in association with encounter. Interpretation of such information and data should be left to medically-trained personnel. Information provided to patient can be located elsewhere in the medical record under "Patient Instructions". Document created using STT-dictation technology, any transcriptional errors that may result from process are unintentional.    Patient: Julie Acevedo  Service Category: E/M  Provider: Oswaldo Done, MD  DOB: 1951-02-04  DOS: 07/29/2023  Referring Provider: Cherlynn Polo  MRN: [id number]  Specialty: Interventional Pain Management  PCP: [name], PA-C  Type: Established Patient  Setting: Ambulatory outpatient    Location: Office  Delivery: Face-to-face     Primary Reason(s) for Visit: Encounter for evaluation before starting new chronic pain management plan of care (Level of risk: moderate) CC: No chief complaint on file.  HPI  Julie Acevedo is a 72 y.o. year old, female patient, who comes today for a follow-up evaluation to review the test results and decide on a treatment plan. She has Essential hypertension; Type 2 diabetes mellitus (HCC); Complex partial status epilepticus (HCC); Gastric bypass status for obesity; Arthritis; Narrowing of intervertebral disc space; Chronic low back pain of over 3 months duration; Hyperlipidemia; Benign essential HTN; Cannot sleep; Arthritis of knee, degenerative; B12 deficiency; Avitaminosis D; Memory difficulties; Partial small bowel obstruction (HCC); Adjustment disorder with mixed anxiety and depressed mood; SBO (small bowel obstruction) (HCC); Ventral incisional hernia; Failed back surgical syndrome; Vaginal vault prolapse; Exophthalmos; Multiple thyroid nodules; Syncope; Anemia; Iron deficiency anemia; Acquired trigger finger; Large bowel obstruction (HCC); Gastroesophageal reflux disease; Low back pain radiating to lower extremity (Right);  Cystitis; Urinary symptom or sign; Callus of foot; Pes planus; PTTD (posterior tibial tendon dysfunction); Stomatitis and mucositis; Cheilitis; TIA (transient ischemic attack); 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, psychotic disturbance, mood disturbance, or anxiety (HCC); Proteinuria, unspecified; Stage 3b chronic kidney disease (HCC); Anemia in chronic kidney disease; Hypokalemia; Other and unspecified hyperlipidemia; Hypoglycemia; Diarrhea; UTI (urinary tract infection); Elevated lipase; Transaminitis; Hypoalbuminemia; Malnutrition of moderate degree; Seizure (HCC); 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; Type 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, without long-term current use of insulin (HCC); Chronic pain syndrome; Pharmacologic therapy; Disorder of skeletal system; Problems influencing health status; Hyperparathyroidism due to renal insufficiency (HCC); Unspecified abnormal findings in urine; Abnormal CT scan, cervical spine (11/19/2022); Abnormal MRI, lumbar spine (02/22/2016); DDD (degenerative disc disease), cervical; DDD (degenerative disc disease), lumbar; Cervical foraminal stenosis (C3-C7); Chronic low back pain (1ry area of Pain) (Bilateral) (R>L) w/o sciatica; Chronic lower extremity pain (2ry area of Pain) (Right); Osteoarthritis of shoulder (Right); Osteoarthritis of knees (Bilateral); Chronic hip pain (3ry area of Pain) (Right); Lumbar facet joint pain; Lumbar facet joint syndrome; Arthropathy of hip (Right); and Body mass index (BMI) of 24.0 to 24.9 in adult on their problem list. Her primarily concern today is the No chief complaint on file.  Pain Assessment: Location:     Radiating:   Onset:   Duration:   Quality:   Severity:  /10 (subjective, self-reported pain score)  Effect on ADL:   Timing:   Modifying factors:   BP:    HR:    Julie Acevedo comes in today for a follow-up visit after her initial evaluation on 06/19/2023.  Today we went over the results of her tests. These were explained in "Layman's terms". During today's appointment we went over my diagnostic impression, as well as the proposed treatment plan.  ***  Patient presented with interventional treatment options. Julie Acevedo was informed that I will not be providing medication management. Pharmacotherapy evaluation including recommendations may be offered, if specifically requested.   Controlled Substance Pharmacotherapy Assessment REMS (Risk Evaluation and Mitigation Strategy)  Opioid Analgesic: None MME/day: 0 mg/day  Pill Count: None expected due to no prior prescriptions written by our practice. No notes on file Pharmacokinetics: Liberation and absorption (onset of action): WNL Distribution (time to peak effect): WNL Metabolism and excretion (duration of action): WNL         Pharmacodynamics: Desired effects: Analgesia: Ms. Signs reports >50% benefit. Functional ability: Patient reports that medication allows her to accomplish basic ADLs Clinically meaningful improvement in function (CMIF): Sustained CMIF goals met Perceived effectiveness: Described as relatively effective, allowing for increase in activities of daily living (ADL) Undesirable effects: Side-effects or Adverse reactions: None reported Monitoring: Julie Acevedo PMP: PDMP reviewed during this encounter. Online review of the past 86-month period previously conducted. Not applicable at this point since we have not taken over the patient's medication management yet. List of other Serum/Urine Drug Screening Test(s):  Lab Results  Component Value Date   COCAINSCRNUR NONE DETECTED 09/05/2021   COCAINSCRNUR NONE DETECTED 03/18/2018   COCAINSCRNUR NONE DETECTED 08/29/2016   COCAINSCRNUR NEGATIVE 01/17/2014   COCAINSCRNUR NONE DETECTED 11/15/2012   THCU NONE DETECTED 09/05/2021   THCU NONE DETECTED 03/18/2018   THCU NONE DETECTED 08/29/2016   THCU NEGATIVE 01/17/2014   THCU NONE DETECTED  11/15/2012   ETH <10 11/19/2022   ETH <10 09/05/2021   ETH <11 10/04/2014   ETH <11 11/15/2012   List of all UDS test(s) done:  Lab Results  Component Value Date   SUMMARY Note 06/19/2023   Last UDS on record: Summary  Date Value Ref Range Status  06/19/2023 Note  Final    Comment:    ==================================================================== Compliance Drug Analysis, Ur ==================================================================== Test                             Result       Flag       Units  Drug Present and Declared for Prescription Verification   Gabapentin                     PRESENT      EXPECTED   Mirtazapine                    PRESENT      EXPECTED   Hydroxyzine                    PRESENT      EXPECTED   Metoprolol                     PRESENT      EXPECTED  Drug Absent but Declared for Prescription Verification   Levetiracetam                  Not Detected UNEXPECTED ==================================================================== Test                      Result    Flag   Units      Ref Range   Creatinine              60               mg/dL      >=  20 ==================================================================== Declared Medications:  The flagging and interpretation on this report are based on the  following declared medications.  Unexpected results may arise from  inaccuracies in the declared medications.   **Note: The testing scope of this panel includes these medications:   Gabapentin (Neurontin)  Hydroxyzine  Levetiracetam (Keppra)  Metoprolol  Mirtazapine (Remeron)   **Note: The testing scope of this panel does not include the  following reported medications:   Atorvastatin  Calcitriol  Diazoxide (Proglycem)  Linaclotide (Linzess)  Rosuvastatin (Crestor)  Triamcinolone (Kenalog) ==================================================================== For clinical consultation, please call [phone]. ====================================================================    UDS interpretation: No unexpected findings.          Medication Assessment Form: Not applicable. No opioids. Treatment compliance: Not applicable Risk Assessment Profile: Aberrant behavior: See initial evaluations. None observed or detected today Comorbid factors increasing risk of overdose: See initial evaluation. No additional risks detected today Opioid risk tool (ORT):     06/19/2023   11:23 AM  Opioid Risk   Alcohol 0  Illegal Drugs 0  Rx Drugs 0  Alcohol 0  Illegal Drugs 0  Rx Drugs 0  Age between 16-45 years  0  History of Preadolescent Sexual Abuse 0  Psychological Disease 0  Depression 0  Opioid Risk Tool Scoring 0  Opioid Risk Interpretation Low Risk    ORT Scoring interpretation table:  Score <3 = Low Risk for SUD  Score between 4-7 = Moderate Risk for SUD  Score >8 = High Risk for Opioid Abuse   Risk of substance use disorder (SUD): Low  Risk Mitigation Strategies:  Patient opioid safety counseling: No controlled substances prescribed. Patient-Prescriber Agreement (PPA): No agreement signed.  Controlled substance notification to other providers: None required. No opioid therapy.  Pharmacologic Plan: Non-opioid analgesic therapy offered. Interventional alternatives discussed.             Laboratory Chemistry Profile   Renal Lab Results  Component Value Date   BUN 11 06/19/2023   CREATININE 1.35 (H) 06/19/2023   BCR 9 (L) 09/13/2021   GFRAA 42 (L) 12/05/2020   GFRNONAA 42 (L) 06/19/2023   SPECGRAV >=1.030 (A) 04/11/2023   PHUR 8.0 04/11/2023   PROTEINUR Negative 04/11/2023     Electrolytes Lab Results  Component Value Date   NA 141 06/19/2023   K 3.9 06/19/2023   CL 105 06/19/2023   CALCIUM 8.9 06/19/2023   MG 2.2 06/19/2023   PHOS 3.4 11/20/2022     Hepatic Lab Results  Component Value Date   AST 61 (H) 06/19/2023   ALT 53 (H) 06/19/2023   ALBUMIN 4.0  06/19/2023   ALKPHOS 209 (H) 06/19/2023   LIPASE 27 08/31/2022   AMMONIA 68 (H) 11/15/2012     ID Lab Results  Component Value Date   HIV Non Reactive 08/08/2022   SARSCOV2NAA NEGATIVE 09/05/2021   STAPHAUREUS NEGATIVE 03/21/2018   MRSAPCR NEGATIVE 03/21/2018     Bone Lab Results  Component Value Date   VD25OH 51.23 08/09/2022   25OHVITD1 39 06/19/2023   25OHVITD2 1.0 06/19/2023   25OHVITD3 38 06/19/2023     Endocrine Lab Results  Component Value Date   GLUCOSE 94 06/19/2023   GLUCOSEU 50 (A) 11/19/2022   HGBA1C 5.7 (A) 04/11/2023   TSH 2.483 01/18/2023   FREET4 0.91 06/28/2021   CRTSLPL 10.9 08/11/2022     Neuropathy Lab Results  Component Value Date   VITAMINB12 303 06/19/2023   FOLATE 19.8 08/09/2022   HGBA1C 5.7 (A)  04/11/2023   HIV Non Reactive 08/08/2022     CNS No results found for: "COLORCSF", "APPEARCSF", "RBCCOUNTCSF", "WBCCSF", "POLYSCSF", "LYMPHSCSF", "EOSCSF", "PROTEINCSF", "GLUCCSF", "JCVIRUS", "CSFOLI", "IGGCSF", "LABACHR", "ACETBL"   Inflammation (CRP: Acute  ESR: Chronic) Lab Results  Component Value Date   CRP <0.5 06/19/2023   ESRSEDRATE 40 (H) 06/19/2023   LATICACIDVEN 1.1 08/21/2022     Rheumatology No results found for: "RF", "ANA", "LABURIC", "URICUR", "LYMEIGGIGMAB", "LYMEABIGMQN", "HLAB27"   Coagulation Lab Results  Component Value Date   INR 1.1 11/19/2022   LABPROT 13.8 11/19/2022   APTT 31 09/05/2021   PLT 303 03/04/2023   DDIMER 0.61 (H) 09/28/2015     Cardiovascular Lab Results  Component Value Date   BNP 40.2 09/28/2015   CKTOTAL 90 09/07/2021   CKMB 1.9 01/27/2012   TROPONINI <0.03 10/13/2018   HGB 10.5 (L) 03/04/2023   HCT 34.3 (L) 03/04/2023     Screening Lab Results  Component Value Date   SARSCOV2NAA NEGATIVE 09/05/2021   STAPHAUREUS NEGATIVE 03/21/2018   MRSAPCR NEGATIVE 03/21/2018   HIV Non Reactive 08/08/2022     Cancer No results found for: "CEA", "CA125", "LABCA2"   Allergens No results  found for: "ALMOND", "APPLE", "ASPARAGUS", "AVOCADO", "BANANA", "BARLEY", "BASIL", "BAYLEAF", "GREENBEAN", "LIMABEAN", "WHITEBEAN", "BEEFIGE", "REDBEET", "BLUEBERRY", "BROCCOLI", "CABBAGE", "MELON", "CARROT", "CASEIN", "CASHEWNUT", "CAULIFLOWER", "CELERY"     Note: Lab results reviewed.  Recent Diagnostic Imaging Review  Cervical Imaging: Cervical MR wo contrast: No results found for this or any previous visit.  Cervical MR wo contrast: No valid procedures specified. Cervical CT wo contrast: Results for orders placed during the hospital encounter of 11/19/22  CT CERVICAL SPINE WO CONTRAST  Narrative CLINICAL DATA:  Trauma  EXAM: CT CERVICAL SPINE WITHOUT CONTRAST  TECHNIQUE: Multidetector CT imaging of the cervical spine was performed without intravenous contrast. Multiplanar CT image reconstructions were also generated.  RADIATION DOSE REDUCTION: This exam was performed according to the departmental dose-optimization program which includes automated exposure control, adjustment of the mA and/or kV according to patient size and/or use of iterative reconstruction technique.  COMPARISON:  Cervical spine radiographs done on 08/08/2022 and CT cervical spine done on 09/14/2016  FINDINGS: Alignment: There is minimal 1-2 mm retrolisthesis at C5-C6 level. This finding has not changed.  Skull base and vertebrae: No recent fracture is seen. Degenerative changes are noted with disc space narrowing and bony spurs at multiple levels.  Soft tissues and spinal canal: There is no central spinal stenosis. Posterior bony spurs are causing extrinsic pressure over the ventral margin of thecal sac at multiple levels.  Disc levels: There is encroachment of neural foramina by bony spurs from C3-C7 levels.  Upper chest: Unremarkable.  Other: There is inhomogeneous attenuation in thyroid. There is 1.8 cm low-density nodule in the posterior right lobe. These findings have not changed  significantly.  IMPRESSION: No recent fracture is seen in cervical spine. Cervical spondylosis with encroachment of neural foramina from C3-C7 levels.  There is inhomogeneous attenuation in thyroid with 1.8 cm nodule in the right lobe. Similar finding was seen in previous CT done on 09/14/2016.   Electronically Signed By: Ernie Avena M.D. On: 11/19/2022 12:58  Cervical DG Bending/F/E views: No results found for this or any previous visit.   Shoulder Imaging: Shoulder-R MR wo contrast: No results found for this or any previous visit.  Shoulder-L MR wo contrast: No results found for this or any previous visit.  Shoulder-R DG: Results for orders placed during the hospital encounter of  02/25/19  DG Shoulder Right  Narrative CLINICAL DATA:  Pain for approximately 3 months  EXAM: RIGHT SHOULDER - 2+ VIEW  COMPARISON:  None.  FINDINGS: Oblique, frontal, and Y scapular images obtained. There is no acute fracture or dislocation. There is mild generalized osteoarthritic change. No erosive change or intra-articular calcification. Visualized right lung clear.  IMPRESSION: Mild generalized joint space narrowing consistent with osteoarthritis. No erosive change. No fracture or dislocation.   Electronically Signed By: Bretta Bang III M.D. On: 02/25/2019 09:47  Shoulder-L DG: No results found for this or any previous visit.   Thoracic Imaging: Thoracic MR wo contrast: No results found for this or any previous visit.  Thoracic MR wo contrast: No valid procedures specified. Thoracic CT wo contrast: No results found for this or any previous visit.  Thoracic DG 4 views: No results found for this or any previous visit.  Thoracic DG w/swimmers view: No results found for this or any previous visit.   Lumbosacral Imaging: Lumbar MR wo contrast: Results for orders placed during the hospital encounter of 02/22/16  MR Lumbar Spine Wo  Contrast  Narrative CLINICAL DATA:  72 year old female with prior lumbar surgery. Pain radiating to the right lower extremity with numbness. Symptoms chronic but increasing. Subsequent encounter.  EXAM: MRI LUMBAR SPINE WITHOUT CONTRAST  TECHNIQUE: Multiplanar, multisequence MR imaging of the lumbar spine was performed. No intravenous contrast was administered.  COMPARISON:  CT Abdomen and Pelvis 12/14/2015 and earlier. Lumbar MRI 10/08/2014.  FINDINGS: Same numbering system as on the 2015 MRI, designating previous posterior and interbody fusion at L4-L5. Solid-appearing arthrodesis as seen on the 2016 CT. Mild hardware susceptibility artifact. Stable vertebral height and alignment since 2015. No marrow edema or evidence of acute osseous abnormality.  Visualized lower thoracic spinal cord is normal with conus medularis at T12.  Stable visualized abdominal viscera. Stable postoperative changes to the lower lumbar paraspinal soft tissues.  T10-T11: Disc desiccation, disc space loss, and mild disc bulge. Mild facet and ligament flavum hypertrophy. No stenosis.  T11-T12:  Negative.  T12-L1: Negative aside from chronic right perineural cyst (series 4, image 6 and series 5, image 9) which is unchanged and of doubtful significance.  L1-L2:  Negative disc.  Mild facet hypertrophy.  No stenosis.  L2-L3: Chronic disc desiccation and posterior disc space loss with broad-based posterior disc extrusion, now slightly more eccentric to the right (series 5, image 22). Stable mild facet and ligament flavum hypertrophy. Mildly increased mass effect on the ventral thecal sac, but no significant spinal or lateral recess stenosis. Borderline to mild L2 foraminal stenosis appears slightly increased.  L3-L4: Chronic mild disc desiccation and circumferential disc bulge. Broad-based posterior component of disc appears stable. Moderate facet and ligament flavum hypertrophy is stable to mildly  increased. Borderline to mild spinal stenosis has not significantly changed. Borderline to mild L3 foraminal stenosis appears stable.  L4-L5:  Sequelae of decompression and fusion, unchanged.  L5-S1: Small central annular fissure of the disc is stable to slightly regressed (series 2, image 9). Stable mild facet hypertrophy. Otherwise negative, no stenosis.  IMPRESSION: 1. Chronic decompression and fusion at L4-L5 with solid arthrodesis. 2. 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 Borderline to mild spinal and foraminal stenosis at the former. 3. Mild progression of disc disease at L2-L3, with increased mass effect on the thecal sac but no significant spinal or lateral recess stenosis. Mild bilateral L2 foraminal stenosis does appear slightly increased. 4. Stable and probably  inconsequential right T12 perineural cyst.   Electronically Signed By: Odessa Fleming M.D. On: 02/22/2016 15:35  Lumbar MR wo contrast: No valid procedures specified. Lumbar CT wo contrast: No results found for this or any previous visit.  Lumbar DG Bending views: Results for orders placed during the hospital encounter of 06/19/23  DG Lumbar Spine Complete W/Bend  Narrative CLINICAL DATA:  Low back pain with a possible radicular component.  EXAM: LUMBAR SPINE - COMPLETE WITH BENDING VIEWS  COMPARISON:  03/16/2013  FINDINGS: Five non-rib-bearing lumbar vertebrae. Stable interbody and pedicle screw and rod fixation at the L4-5 level with normal alignment. Mild facet degenerative changes with interval mild grade 1 anterolisthesis at the L3-4 level. This measures 4 mm in the neutral position, 3 mm with flexion and 3 mm with extension. Minimal anterior spur formation at the L1-2 and L2-3 levels and mild anterior spur formation at the L3-4 level. No fractures or pars defects.  IMPRESSION: 1. Stable postoperative changes at the L4-5 level. 2. Mild facet degenerative changes  with interval mild grade 1 anterolisthesis at the L3-4 level. No significant motion with flexion or extension. 3. No fractures.   Electronically Signed By: Beckie Salts M.D. On: 06/25/2023 17:11         Sacroiliac Joint Imaging: Sacroiliac Joint DG: No results found for this or any previous visit.   Hip Imaging: Hip-R MR wo contrast: No results found for this or any previous visit.  Hip-L MR wo contrast: No results found for this or any previous visit.  Hip-R CT wo contrast: No results found for this or any previous visit.  Hip-L CT wo contrast: No results found for this or any previous visit.  Hip-R DG 2-3 views: Results for orders placed during the hospital encounter of 06/19/23  DG HIP UNILAT W OR W/O PELVIS 2-3 VIEWS RIGHT  Narrative CLINICAL DATA:  Right hip pain/arthralgia. Possible radicular component.  EXAM: DG HIP (WITH OR WITHOUT PELVIS) 2-3V RIGHT  COMPARISON:  Abdomen and pelvis CT dated 06/22/2019.  FINDINGS: Minimal right femoral head and neck junction spur formation. Otherwise, normal-appearing right hip. Normal appearing left hip and pelvic bones. Lower lumbar spine fixation hardware.  IMPRESSION: Minimal right hip degenerative changes.   Electronically Signed By: Beckie Salts M.D. On: 06/25/2023 17:14  Hip-L DG 2-3 views: No results found for this or any previous visit.  Hip-B DG Bilateral: No results found for this or any previous visit.  Hip-B DG Bilateral (5V): No results found for this or any previous visit.   Knee Imaging: Knee-R MR wo contrast: No results found for this or any previous visit.  Knee-L MR wo contrast: No results found for this or any previous visit.  Knee-R CT wo contrast: No results found for this or any previous visit.  Knee-L CT wo contrast: No results found for this or any previous visit.  Knee-R DG 4 views: Results for orders placed in visit on 09/01/20  DG Knee Complete 4 Views Right  Narrative CLINICAL  DATA:  Bilateral knee pain  EXAM: RIGHT KNEE - COMPLETE 4+ VIEW  COMPARISON:  None.  FINDINGS: No fracture or malalignment. Mild to moderate medial and patellofemoral joint space degenerative change. No significant knee effusion  IMPRESSION: Mild to moderate arthritis.  No acute osseous abnormality   Electronically Signed By: Jasmine Pang M.D. On: 09/02/2020 00:19  Knee-L DG 4 views: Results for orders placed in visit on 09/01/20  DG Knee Complete 4 Views Left  Narrative  CLINICAL DATA:  Bilateral knee pain  EXAM: LEFT KNEE - COMPLETE 4+ VIEW  COMPARISON:  None.  FINDINGS: No fracture or malalignment. Mild medial and patellofemoral joint space degenerative changes. No significant knee effusion.  IMPRESSION: Mild degenerative changes.   Electronically Signed By: Jasmine Pang M.D. On: 09/02/2020 00:18   Ankle Imaging: Ankle-R DG Complete: No results found for this or any previous visit.  Ankle-L DG Complete: Results for orders placed during the hospital encounter of 03/10/21  DG Ankle Complete Left  Narrative CLINICAL DATA:  Swelling, discoloration, possible insect bite  EXAM: LEFT ANKLE COMPLETE - 3+ VIEW  COMPARISON:  None.  FINDINGS: Soft tissue swelling about the ankle is most pronounced laterally. No sizeable joint effusion. No soft tissue gas or foreign body. No acute bony abnormality. Specifically, no fracture, subluxation, or dislocation. Degenerative changes in the included ankle and hindfoot. Bidirectional calcaneal spurs. No conspicuous osseous lesions are radiographic features to suggest osteomyelitis.  IMPRESSION: Soft tissue swelling about the ankle is most pronounced laterally. No soft tissue gas or foreign body. No acute osseous abnormality.   Electronically Signed By: Kreg Shropshire M.D. On: 03/10/2021 06:17   Foot Imaging: Foot-R DG Complete: No results found for this or any previous visit.  Foot-L DG Complete: Results  for orders placed in visit on 01/07/23  DG Foot Complete Left  Narrative Please see detailed radiograph report in office note.   Elbow Imaging: Elbow-R DG Complete: No results found for this or any previous visit.  Elbow-L DG Complete: No results found for this or any previous visit.   Wrist Imaging: Wrist-R DG Complete: No results found for this or any previous visit.  Wrist-L DG Complete: No results found for this or any previous visit.   Hand Imaging: Hand-R DG Complete: No results found for this or any previous visit.  Hand-L DG Complete: No results found for this or any previous visit.   Complexity Note: Imaging results reviewed.                         Meds   Current Outpatient Medications:    ACCU-CHEK GUIDE test strip, USE TO CHECK BLOOD SUGAR UP TO 4 TIMES A DAY AS DIRECTED, Disp: 100 strip, Rfl: 1   Accu-Chek Softclix Lancets lancets, SMARTSIG:Topical 1-4 Times Daily, Disp: , Rfl:    atorvastatin (LIPITOR) 40 MG tablet, Take 1 tablet by mouth every morning., Disp: , Rfl:    blood glucose meter kit and supplies, Dispense Accu-Chek Guide. Use up to four times daily as directed. (FOR ICD-10 E11.22 and N18.32)., Disp: 1 each, Rfl: 0   calcitRIOL (ROCALTROL) 0.25 MCG capsule, Take 0.25 mcg by mouth daily., Disp: , Rfl:    diazoxide (PROGLYCEM) 50 MG/ML suspension, take 1ml (50mg  total) BY MOUTH EVERY 8 HOURS, Disp: , Rfl:    gabapentin (NEURONTIN) 300 MG capsule, TAKE ONE CAPSULE BY MOUTH THREE TIMES DAILY, Disp: 270 capsule, Rfl: 1   hydrOXYzine (VISTARIL) 100 MG capsule, TAKE ONE CAPSULE BY MOUTH THREE TIMES DAILY, Disp: 270 capsule, Rfl: 1   levETIRAcetam (KEPPRA) 500 MG tablet, Take 1 tablet (500 mg total) by mouth 2 (two) times daily., Disp: 60 tablet, Rfl: 3   LINZESS 290 MCG CAPS capsule, TAKE ONE CAPSULE BY MOUTH BEFORE BREAKFAST, Disp: 90 capsule, Rfl: 0   metoprolol succinate (TOPROL-XL) 25 MG 24 hr tablet, TAKE ONE TABLET BY MOUTH EVERY MORNING, Disp: 90  tablet, Rfl: 1   mirtazapine (REMERON  SOL-TAB) 15 MG disintegrating tablet, TAKE 1 TABLET BY MOUTH EVERYDAY AT BEDTIME, Disp: , Rfl:    mirtazapine (REMERON) 30 MG tablet, Take 1 tablet by mouth at bedtime., Disp: , Rfl:    rosuvastatin (CRESTOR) 40 MG tablet, Take 1 tablet (40 mg total) by mouth daily., Disp: 90 tablet, Rfl: 3   triamcinolone cream (KENALOG) 0.5 %, Apply 1 Application topically 3 (three) times daily as needed (flares)., Disp: , Rfl:   ROS  Constitutional: Denies any fever or chills Gastrointestinal: No reported hemesis, hematochezia, vomiting, or acute GI distress Musculoskeletal: Denies any acute onset joint swelling, redness, loss of ROM, or weakness Neurological: No reported episodes of acute onset apraxia, aphasia, dysarthria, agnosia, amnesia, paralysis, loss of coordination, or loss of consciousness  Allergies  Ms. Truan is allergic to lotrel [amlodipine besy-benazepril hcl], contrast media [iodinated contrast media], niacin, and sulfa antibiotics.  PFSH  Drug: Ms. Genin  reports no history of drug use. Alcohol:  reports no history of alcohol use. Tobacco:  reports that she has never smoked. She has never used smokeless tobacco. Medical:  has a past medical history of Anemia, Arthritis, COVID-19 (11/19/2019), Diverticulitis, DM (diabetes mellitus) (HCC), History of methicillin resistant staphylococcus aureus (MRSA) (2017), HTN (hypertension), Hyperlipidemia, L-S radiculopathy (06/11/2015), Left leg swelling (01/22/2023), Lower abdominal pain (08/08/2022), Memory difficulties (09/01/2015), Multiple thyroid nodules, Myocardial infarction (HCC) (1995), Short-term memory loss, and Thyroid nodule. Surgical: Ms. Mosquera  has a past surgical history that includes Hemicolectomy; Lumbar disc surgery; Roux-en-Y Gastric Bypass (2010); Cholecystectomy; Eye surgery; Hand surgery; Foot surgery; Abdominal hysterectomy; Tonsillectomy; Upper gi endoscopy (01/12/2016); Cardiac  catheterization (2000); Laparoscopic lysis of adhesions (N/A, 08/21/2016); Ventral hernia repair (N/A, 08/21/2016); Insertion of mesh (N/A, 08/21/2016); Cystocele repair (N/A, 03/19/2017); Cystoscopy (N/A, 03/19/2017); Laparoscopic removal abdominal mass; LOOP RECORDER INSERTION (N/A, 03/21/2018); laparotomy (N/A, 04/16/2019); XI robotic assisted ventral hernia (N/A, 12/08/2019); Insertion of mesh (N/A, 12/08/2019); Breast excisional biopsy (Right, 1971); Small intestine surgery; and Esophagogastroduodenoscopy (egd) with propofol (N/A, 12/15/2021). Family: family history includes Breast cancer in her paternal grandmother; Breast cancer (age of onset: 68) in her paternal aunt; Breast cancer (age of onset: 55) in her paternal aunt; COPD in her mother; Cancer in her paternal aunt and paternal grandmother; Cancer (age of onset: 50) in her father; Congestive Heart Failure in her maternal grandmother; Coronary artery disease (age of onset: 14) in her father; Diabetes in her mother; Emphysema in her maternal grandfather; Heart attack in her paternal grandfather; Hypertension in her mother.  Constitutional Exam  General appearance: Well nourished, well developed, and well hydrated. In no apparent acute distress There were no vitals filed for this visit. BMI Assessment: Estimated body mass index is 24.8 kg/m as calculated from the following:   Height as of 06/19/23: 5\' 3"  (1.6 m).   Weight as of 06/19/23: 140 lb (63.5 kg).  BMI interpretation table: BMI level Category Range association with higher incidence of chronic pain  <18 kg/m2 Underweight   18.5-24.9 kg/m2 Ideal body weight   25-29.9 kg/m2 Overweight Increased incidence by 20%  30-34.9 kg/m2 Obese (Class I) Increased incidence by 68%  35-39.9 kg/m2 Severe obesity (Class II) Increased incidence by 136%  >40 kg/m2 Extreme obesity (Class III) Increased incidence by 254%   Patient's current BMI Ideal Body weight  There is no height or weight on file to  calculate BMI. Patient weight not recorded   BMI Readings from Last 4 Encounters:  06/19/23 24.80 kg/m  05/16/23 24.92 kg/m  04/11/23 24.09 kg/m  04/02/23  24.27 kg/m   Wt Readings from Last 4 Encounters:  06/19/23 140 lb (63.5 kg)  05/16/23 140 lb 11.2 oz (63.8 kg)  04/11/23 136 lb (61.7 kg)  04/02/23 137 lb (62.1 kg)    Psych/Mental status: Alert, oriented x 3 (person, place, & time)       Eyes: PERLA Respiratory: No evidence of acute respiratory distress  Assessment & Plan  Primary Diagnosis & Pertinent Problem List: {There were no encounter diagnoses. (Refresh or delete this SmartLink)}  Visit Diagnosis: No diagnosis found. Problems updated and reviewed during this visit: No problems updated.  Plan of Care  Pharmacotherapy (Medications Ordered): No orders of the defined types were placed in this encounter.  Procedure Orders    No procedure(s) ordered today   Lab Orders  No laboratory test(s) ordered today   Imaging Orders  No imaging studies ordered today   Referral Orders  No referral(s) requested today    Pharmacological management:  Opioid Analgesics: I will not be prescribing any opioids at this time Membrane stabilizer: I will not be prescribing any at this time Muscle relaxant: I will not be prescribing any at this time NSAID: I will not be prescribing any at this time Other analgesic(s): I will not be prescribing any at this time      Interventional Therapies  Risk Factors  Considerations:  Cardiologist: Dorothyann Peng, MD ([facility])  Neurologist: Cristopher Peru, MD [facility] neurolog[facility]) Hx of MI (1995)  Hx of TIAs  T2DM  Stage 3b CKD  [name]  Memory impairment  Hx of Bowel Obstruction (04/2019)  GERD  s/p Gastric Bypass for obesity  Malnutrition-malabsorption  Hx. Seizures   Planned  Pending:      Under consideration:      Completed:   None at this time   Therapeutic  Palliative (PRN) options:   None  established   Completed by other providers:   Therapeutic/diagnostic right L5 TFESI x2 (04/11/2016, 05/02/2016) by Merri Ray, DO [facility])  Therapeutic/diagnostic right S1 TFESI x2 (04/11/2016, 05/02/2016) by Merri Ray, DO [facility])  Therapeutic/diagnostic right L3 TFESI x1 (07/19/2016) by Merri Ray, DO [facility])         Provider-requested follow-up: No follow-ups on file. Recent Visits Date Type Provider Dept  06/19/23 Office Visit Delano Metz, MD [facility]  Showing recent visits within past 90 days and meeting all other requirements Future Appointments Date Type Provider Dept  07/29/23 Appointment Delano Metz, MD [facility]  Showing future appointments within next 90 days and meeting all other requirements   Primary Care Physician: [name], PA-C  Duration of encounter: *** minutes.  Total time on encounter, as per AMA guidelines included both the face-to-face and non-face-to-face time personally spent by the physician and/or other qualified health care professional(s) on the day of the encounter (includes time in activities that require the physician or other qualified health care professional and does not include time in activities normally performed by clinical staff). Physician's time may include the following activities when performed: Preparing to see the patient (e.g., pre-charting review of records, searching for previously ordered imaging, lab work, and nerve conduction tests) Review of prior analgesic pharmacotherapies. Reviewing PMP Interpreting ordered tests (e.g., lab work, imaging, nerve conduction tests) Performing post-procedure evaluations, including interpretation of diagnostic procedures Obtaining and/or reviewing separately obtained history Performing a medically appropriate examination and/or evaluation Counseling and educating the patient/family/caregiver Ordering medications, tests, or  procedures Referring and communicating with other health care  professionals (when not separately reported) Documenting clinical information in the electronic or other health record Independently interpreting results (not separately reported) and communicating results to the patient/ family/caregiver Care coordination (not separately reported)  Note by: Oswaldo Done, MD (TTS technology used. I apologize for any typographical errors that were not detected and corrected.) Date: 07/29/2023; Time: 8:58 AM

## 2023-07-29 ENCOUNTER — Ambulatory Visit (HOSPITAL_BASED_OUTPATIENT_CLINIC_OR_DEPARTMENT_OTHER): Payer: 59 | Admitting: Pain Medicine

## 2023-07-29 DIAGNOSIS — M1611 Unilateral primary osteoarthritis, right hip: Secondary | ICD-10-CM | POA: Insufficient documentation

## 2023-07-29 DIAGNOSIS — Z91199 Patient's noncompliance with other medical treatment and regimen due to unspecified reason: Secondary | ICD-10-CM

## 2023-07-29 DIAGNOSIS — G8929 Other chronic pain: Secondary | ICD-10-CM

## 2023-08-01 DIAGNOSIS — R569 Unspecified convulsions: Secondary | ICD-10-CM | POA: Diagnosis not present

## 2023-08-01 DIAGNOSIS — Z9884 Bariatric surgery status: Secondary | ICD-10-CM | POA: Diagnosis not present

## 2023-08-01 DIAGNOSIS — F02A18 Dementia in other diseases classified elsewhere, mild, with other behavioral disturbance: Secondary | ICD-10-CM | POA: Diagnosis not present

## 2023-08-01 DIAGNOSIS — N1832 Chronic kidney disease, stage 3b: Secondary | ICD-10-CM | POA: Diagnosis not present

## 2023-08-01 DIAGNOSIS — M545 Low back pain, unspecified: Secondary | ICD-10-CM | POA: Diagnosis not present

## 2023-08-01 DIAGNOSIS — E1122 Type 2 diabetes mellitus with diabetic chronic kidney disease: Secondary | ICD-10-CM | POA: Diagnosis not present

## 2023-08-12 ENCOUNTER — Other Ambulatory Visit: Payer: Self-pay | Admitting: Physician Assistant

## 2023-08-15 NOTE — Progress Notes (Unsigned)
PROVIDER NOTE: Information contained herein reflects review and annotations entered in association with encounter. Interpretation of such information and data should be left to medically-trained personnel. Information provided to patient can be located elsewhere in the medical record under "Patient Instructions". Document created using STT-dictation technology, any transcriptional errors that may result from process are unintentional.    Patient: Julie Acevedo  Service Category: E/M  Provider: Oswaldo Done, MD  DOB: 1951-02-19  DOS: 08/21/2023  Referring Provider: Cherlynn Polo  MRN: [id number]  Specialty: Interventional Pain Management  PCP: [name], PA-C  Type: Established Patient  Setting: Ambulatory outpatient    Location: Office  Delivery: Face-to-face     Primary Reason(s) for Visit: Encounter for evaluation before starting new chronic pain management plan of care (Level of risk: moderate) CC: No chief complaint on file.  HPI  Julie Acevedo is a 72 y.o. year old, female patient, who comes today for a follow-up evaluation to review the test results and decide on a treatment plan. She has Essential hypertension; Type 2 diabetes mellitus (HCC); Complex partial status epilepticus (HCC); Gastric bypass status for obesity; Arthritis; Narrowing of intervertebral disc space; Chronic low back pain of over 3 months duration; Hyperlipidemia; Benign essential HTN; Cannot sleep; Arthritis of knee, degenerative; B12 deficiency; Avitaminosis D; Memory difficulties; Partial small bowel obstruction (HCC); Adjustment disorder with mixed anxiety and depressed mood; SBO (small bowel obstruction) (HCC); Ventral incisional hernia; Failed back surgical syndrome; Vaginal vault prolapse; Exophthalmos; Multiple thyroid nodules; Syncope; Anemia; Iron deficiency anemia; Acquired trigger finger; Large bowel obstruction (HCC); Gastroesophageal reflux disease; Low back pain radiating to lower extremity (Right);  Cystitis; Urinary symptom or sign; Callus of foot; Pes planus; PTTD (posterior tibial tendon dysfunction); Stomatitis and mucositis; Cheilitis; TIA (transient ischemic attack); 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, psychotic disturbance, mood disturbance, or anxiety (HCC); Proteinuria, unspecified; Stage 3b chronic kidney disease (HCC); Anemia in chronic kidney disease; Hypokalemia; Other and unspecified hyperlipidemia; Hypoglycemia; Diarrhea; UTI (urinary tract infection); Elevated lipase; Transaminitis; Hypoalbuminemia; Malnutrition of moderate degree; Seizure (HCC); 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; Type 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, without long-term current use of insulin (HCC); Chronic pain syndrome; Pharmacologic therapy; Disorder of skeletal system; Problems influencing health status; Hyperparathyroidism due to renal insufficiency (HCC); Unspecified abnormal findings in urine; Abnormal CT scan, cervical spine (11/19/2022); Abnormal MRI, lumbar spine (02/22/2016); DDD (degenerative disc disease), cervical; DDD (degenerative disc disease), lumbar; Cervical foraminal stenosis (C3-C7); Chronic low back pain (1ry area of Pain) (Bilateral) (R>L) w/o sciatica; Chronic lower extremity pain (2ry area of Pain) (Right); Osteoarthritis of shoulder (Right); Osteoarthritis of knees (Bilateral); Chronic hip pain (3ry area of Pain) (Right); Lumbar facet joint pain; Lumbar facet joint syndrome; Arthropathy of hip (Right); Body mass index (BMI) of 24.0 to 24.9 in adult; Secondary hyperparathyroidism of renal origin [facility]); and Osteoarthritis of hip (Right) on their problem list. Her primarily concern today is the No chief complaint on file.  Pain Assessment: Location:     Radiating:   Onset:   Duration:   Quality:   Severity:  /10 (subjective, self-reported pain score)  Effect on ADL:   Timing:   Modifying factors:   BP:    HR:    Ms.  Julie Acevedo comes in today for a follow-up visit after her initial evaluation on 07/29/2023. Today we went over the results of her tests. These were explained in "Layman's terms". During today's appointment we went over my diagnostic impression,  as well as the proposed treatment plan.  Review of initial evaluation (06/19/2023): "The patient was initially scheduled to come in for evaluation on 05/22/2023 but did not keep that appointment.  The patient indicates having been referred to [place] by her PCP, Carmel Sacramento, PA-C for [place] to take over her medication management.  Unfortunately, we no longer offer that service.   According to the patient the primary area of pain is that of the lower back (Bilateral) (R>L).  She indicates having had problems with her back for the past 25 years.  The patient admits to 2 prior back surgeries done by Dr. Dutch Quint at the [facility] in [place].  The first 1 was done in 1999 for right lower extremity radiculopathy and low back pain.  Although the radiculopathy completely went away after the surgery the patient continued to have pain in the back and she pursued further surgery having had it in 2000.  In talking to the patient today she indicates that the reason why she had the surgery was for the back pain and since it did not improve, she consider the surgeries to have been failures.  The patient denies having had physical therapy, recent x-rays, or having any recent nerve blocks.  However, she admits having being a patient of Dr. Ewing Schlein and having had some facet blocks in the past which apparently did help her pain.  She describes the low back pain as being constant and being referred towards the area of her buttocks, bilaterally with the right side being worse than the left.   Interventional procedures completed by other providers:   Therapeutic/diagnostic right L5 TFESI x2 (04/11/2016, 05/02/2016) by Merri Ray, DO [facility])  Therapeutic/diagnostic right S1 TFESI x2  (04/11/2016, 05/02/2016) by Merri Ray, DO [facility])  Therapeutic/diagnostic right L3 TFESI x1 (07/19/2016) by Merri Ray, DO [facility])    The patient describes a secondary area pain as that of the lower extremity (Right).  She describes the pain to start in the buttocks area and is around through the posterolateral aspect of the upper leg down to the knee.  She denies any pain, numbness or weakness below the level of the knee.   The patient's third area pain is out of the hip (Right).  She denies having had any surgery, recent x-rays, physical therapy, or hip joint injections.   Physical exam: Today the patient had a lot of difficulty with toe walking, suggesting some degree of weakness in the S1 distribution.  However, she denied any pain or numbness that would be compatible with an S1 radiculopathy.  The patient was able to heel walk without any problems.  The patient also presented with difficulty with lumbar flexion and extension demonstrating significant decreased range of motion.  She also had significant pain referred towards the lower back upon attempting that maneuver.  Hyperextension of the lumbar spine was also positive for reproduction of the low back pain and demonstrated decreased range of motion.  Lumbar hyperextension and rotation was positive bilaterally, but significantly worse when going towards the right side which was concordant with the findings on the [facility] maneuver suggestive of bilateral lumbar facet arthralgia with the right side being worse than the left.  Patrick maneuver was positive on the right side for hip joint arthralgia and significant decreased range of motion.  Left side seems to be within normal limits.   Medical comorbidities: The patient has a history of seizure disorder, prior MI in 1999, prior stroke in 2023,  as well has a history of type 2 diabetes controlled with diet, GERD, and having had a gastric bypass for the treatment of obesity.   Initial assessment  is that of lumbar facet disease and right hip joint arthropathy."   Review of ordered test on 06/19/2023: Lab work came back within normal limits.  Diagnostic x-rays of the right hip show minimal right hip degenerative changes.  Diagnostic x-rays of the lumbar spine with bending views show stable postoperative changes at the L4-5 level.  Mild facet degenerative changes with interval mild grade 1 anterolisthesis at the L3-4 level.  No significant motion with flexion or extension.  No fractures.  ***  Patient presented with interventional treatment options. Ms. Pettaway was informed that I will not be providing medication management. Pharmacotherapy evaluation including recommendations may be offered, if specifically requested.   Controlled Substance Pharmacotherapy Assessment REMS (Risk Evaluation and Mitigation Strategy)  Opioid Analgesic: None MME/day: 0 mg/day  Pill Count: None expected due to no prior prescriptions written by our practice. No notes on file Pharmacokinetics: Liberation and absorption (onset of action): WNL Distribution (time to peak effect): WNL Metabolism and excretion (duration of action): WNL         Pharmacodynamics: Desired effects: Analgesia: Ms. Halvorsen reports >50% benefit. Functional ability: Patient reports that medication allows her to accomplish basic ADLs Clinically meaningful improvement in function (CMIF): Sustained CMIF goals met Perceived effectiveness: Described as relatively effective, allowing for increase in activities of daily living (ADL) Undesirable effects: Side-effects or Adverse reactions: None reported Monitoring: [place] PMP: PDMP reviewed during this encounter. Online review of the past 7-month period previously conducted. Not applicable at this point since we have not taken over the patient's medication management yet. List of other Serum/Urine Drug Screening Test(s):  Lab Results  Component Value Date   COCAINSCRNUR NONE DETECTED 09/05/2021    COCAINSCRNUR NONE DETECTED 03/18/2018   COCAINSCRNUR NONE DETECTED 08/29/2016   COCAINSCRNUR NEGATIVE 01/17/2014   COCAINSCRNUR NONE DETECTED 11/15/2012   THCU NONE DETECTED 09/05/2021   THCU NONE DETECTED 03/18/2018   THCU NONE DETECTED 08/29/2016   THCU NEGATIVE 01/17/2014   THCU NONE DETECTED 11/15/2012   ETH <10 11/19/2022   ETH <10 09/05/2021   ETH <11 10/04/2014   ETH <11 11/15/2012   List of all UDS test(s) done:  Lab Results  Component Value Date   SUMMARY Note 06/19/2023   Last UDS on record: Summary  Date Value Ref Range Status  06/19/2023 Note  Final    Comment:    ==================================================================== Compliance Drug Analysis, Ur ==================================================================== Test                             Result       Flag       Units  Drug Present and Declared for Prescription Verification   Gabapentin                     PRESENT      EXPECTED   Mirtazapine                    PRESENT      EXPECTED   Hydroxyzine                    PRESENT      EXPECTED   Metoprolol  PRESENT      EXPECTED  Drug Absent but Declared for Prescription Verification   Levetiracetam                  Not Detected UNEXPECTED ==================================================================== Test                      Result    Flag   Units      Ref Range   Creatinine              60               mg/dL      >=65 ==================================================================== Declared Medications:  The flagging and interpretation on this report are based on the  following declared medications.  Unexpected results may arise from  inaccuracies in the declared medications.   **Note: The testing scope of this panel includes these medications:   Gabapentin (Neurontin)  Hydroxyzine  Levetiracetam (Keppra)  Metoprolol  Mirtazapine (Remeron)   **Note: The testing scope of this panel does not include the   following reported medications:   Atorvastatin  Calcitriol  Diazoxide (Proglycem)  Linaclotide (Linzess)  Rosuvastatin (Crestor)  Triamcinolone (Kenalog) ==================================================================== For clinical consultation, please call [phone]. ====================================================================    UDS interpretation: No unexpected findings.          Medication Assessment Form: Not applicable. No opioids. Treatment compliance: Not applicable Risk Assessment Profile: Aberrant behavior: See initial evaluations. None observed or detected today Comorbid factors increasing risk of overdose: See initial evaluation. No additional risks detected today Opioid risk tool (ORT):     06/19/2023   11:23 AM  Opioid Risk   Alcohol 0  Illegal Drugs 0  Rx Drugs 0  Alcohol 0  Illegal Drugs 0  Rx Drugs 0  Age between 16-45 years  0  History of Preadolescent Sexual Abuse 0  Psychological Disease 0  Depression 0  Opioid Risk Tool Scoring 0  Opioid Risk Interpretation Low Risk    ORT Scoring interpretation table:  Score <3 = Low Risk for SUD  Score between 4-7 = Moderate Risk for SUD  Score >8 = High Risk for Opioid Abuse   Risk of substance use disorder (SUD): Low  Risk Mitigation Strategies:  Patient opioid safety counseling: No controlled substances prescribed. Patient-Prescriber Agreement (PPA): No agreement signed.  Controlled substance notification to other providers: None required. No opioid therapy.  Pharmacologic Plan: Non-opioid analgesic therapy offered. Interventional alternatives discussed.             Laboratory Chemistry Profile   Renal Lab Results  Component Value Date   BUN 11 06/19/2023   CREATININE 1.35 (H) 06/19/2023   BCR 9 (L) 09/13/2021   GFRAA 42 (L) 12/05/2020   GFRNONAA 42 (L) 06/19/2023   SPECGRAV >=1.030 (A) 04/11/2023   PHUR 8.0 04/11/2023   PROTEINUR Negative 04/11/2023     Electrolytes Lab  Results  Component Value Date   NA 141 06/19/2023   K 3.9 06/19/2023   CL 105 06/19/2023   CALCIUM 8.9 06/19/2023   MG 2.2 06/19/2023   PHOS 3.4 11/20/2022     Hepatic Lab Results  Component Value Date   AST 61 (H) 06/19/2023   ALT 53 (H) 06/19/2023   ALBUMIN 4.0 06/19/2023   ALKPHOS 209 (H) 06/19/2023   LIPASE 27 08/31/2022   AMMONIA 68 (H) 11/15/2012     ID Lab Results  Component Value Date   HIV Non Reactive 08/08/2022  SARSCOV2NAA NEGATIVE 09/05/2021   STAPHAUREUS NEGATIVE 03/21/2018   MRSAPCR NEGATIVE 03/21/2018     Bone Lab Results  Component Value Date   VD25OH 51.23 08/09/2022   25OHVITD1 39 06/19/2023   25OHVITD2 1.0 06/19/2023   25OHVITD3 38 06/19/2023     Endocrine Lab Results  Component Value Date   GLUCOSE 94 06/19/2023   GLUCOSEU 50 (A) 11/19/2022   HGBA1C 5.7 (A) 04/11/2023   TSH 2.483 01/18/2023   FREET4 0.91 06/28/2021   CRTSLPL 10.9 08/11/2022     Neuropathy Lab Results  Component Value Date   VITAMINB12 303 06/19/2023   FOLATE 19.8 08/09/2022   HGBA1C 5.7 (A) 04/11/2023   HIV Non Reactive 08/08/2022     CNS No results found for: "COLORCSF", "APPEARCSF", "RBCCOUNTCSF", "WBCCSF", "POLYSCSF", "LYMPHSCSF", "EOSCSF", "PROTEINCSF", "GLUCCSF", "JCVIRUS", "CSFOLI", "IGGCSF", "LABACHR", "ACETBL"   Inflammation (CRP: Acute  ESR: Chronic) Lab Results  Component Value Date   CRP <0.5 06/19/2023   ESRSEDRATE 40 (H) 06/19/2023   LATICACIDVEN 1.1 08/21/2022     Rheumatology No results found for: "RF", "ANA", "LABURIC", "URICUR", "LYMEIGGIGMAB", "LYMEABIGMQN", "HLAB27"   Coagulation Lab Results  Component Value Date   INR 1.1 11/19/2022   LABPROT 13.8 11/19/2022   APTT 31 09/05/2021   PLT 303 03/04/2023   DDIMER 0.61 (H) 09/28/2015     Cardiovascular Lab Results  Component Value Date   BNP 40.2 09/28/2015   CKTOTAL 90 09/07/2021   CKMB 1.9 01/27/2012   TROPONINI <0.03 10/13/2018   HGB 10.5 (L) 03/04/2023   HCT 34.3 (L)  03/04/2023     Screening Lab Results  Component Value Date   SARSCOV2NAA NEGATIVE 09/05/2021   STAPHAUREUS NEGATIVE 03/21/2018   MRSAPCR NEGATIVE 03/21/2018   HIV Non Reactive 08/08/2022     Cancer No results found for: "CEA", "CA125", "LABCA2"   Allergens No results found for: "ALMOND", "APPLE", "ASPARAGUS", "AVOCADO", "BANANA", "BARLEY", "BASIL", "BAYLEAF", "GREENBEAN", "LIMABEAN", "WHITEBEAN", "BEEFIGE", "REDBEET", "BLUEBERRY", "BROCCOLI", "CABBAGE", "MELON", "CARROT", "CASEIN", "CASHEWNUT", "CAULIFLOWER", "CELERY"     Note: Lab results reviewed.  Recent Diagnostic Imaging Review  Cervical Imaging: Cervical CT wo contrast: Results for orders placed during the hospital encounter of 11/19/22 CT CERVICAL SPINE WO CONTRAST  Narrative CLINICAL DATA:  Trauma  EXAM: CT CERVICAL SPINE WITHOUT CONTRAST  TECHNIQUE: Multidetector CT imaging of the cervical spine was performed without intravenous contrast. Multiplanar CT image reconstructions were also generated.  RADIATION DOSE REDUCTION: This exam was performed according to the departmental dose-optimization program which includes automated exposure control, adjustment of the mA and/or kV according to patient size and/or use of iterative reconstruction technique.  COMPARISON:  Cervical spine radiographs done on 08/08/2022 and CT cervical spine done on 09/14/2016  FINDINGS: Alignment: There is minimal 1-2 mm retrolisthesis at C5-C6 level. This finding has not changed.  Skull base and vertebrae: No recent fracture is seen. Degenerative changes are noted with disc space narrowing and bony spurs at multiple levels.  Soft tissues and spinal canal: There is no central spinal stenosis. Posterior bony spurs are causing extrinsic pressure over the ventral margin of thecal sac at multiple levels.  Disc levels: There is encroachment of neural foramina by bony spurs from C3-C7 levels.  Upper chest: Unremarkable.  Other: There  is inhomogeneous attenuation in thyroid. There is 1.8 cm low-density nodule in the posterior right lobe. These findings have not changed significantly.  IMPRESSION: No recent fracture is seen in cervical spine. Cervical spondylosis with encroachment of neural foramina from C3-C7 levels.  There is inhomogeneous  attenuation in thyroid with 1.8 cm nodule in the right lobe. Similar finding was seen in previous CT done on 09/14/2016.   Electronically Signed By: Ernie Avena M.D. On: 11/19/2022 12:58  Shoulder Imaging: Shoulder-R DG: Results for orders placed during the hospital encounter of 02/25/19 DG Shoulder Right  Narrative CLINICAL DATA:  Pain for approximately 3 months  EXAM: RIGHT SHOULDER - 2+ VIEW  COMPARISON:  None.  FINDINGS: Oblique, frontal, and Y scapular images obtained. There is no acute fracture or dislocation. There is mild generalized osteoarthritic change. No erosive change or intra-articular calcification. Visualized right lung clear.  IMPRESSION: Mild generalized joint space narrowing consistent with osteoarthritis. No erosive change. No fracture or dislocation.   Electronically Signed By: Bretta Bang III M.D. On: 02/25/2019 09:47  Lumbosacral Imaging: Lumbar MR wo contrast: Results for orders placed during the hospital encounter of 02/22/16 MR Lumbar Spine Wo Contrast  Narrative CLINICAL DATA:  72 year old female with prior lumbar surgery. Pain radiating to the right lower extremity with numbness. Symptoms chronic but increasing. Subsequent encounter.  EXAM: MRI LUMBAR SPINE WITHOUT CONTRAST  TECHNIQUE: Multiplanar, multisequence MR imaging of the lumbar spine was performed. No intravenous contrast was administered.  COMPARISON:  CT Abdomen and Pelvis 12/14/2015 and earlier. Lumbar MRI 10/08/2014.  FINDINGS: Same numbering system as on the 2015 MRI, designating previous posterior and interbody fusion at L4-L5.  Solid-appearing arthrodesis as seen on the 2016 CT. Mild hardware susceptibility artifact. Stable vertebral height and alignment since 2015. No marrow edema or evidence of acute osseous abnormality.  Visualized lower thoracic spinal cord is normal with conus medularis at T12.  Stable visualized abdominal viscera. Stable postoperative changes to the lower lumbar paraspinal soft tissues.  T10-T11: Disc desiccation, disc space loss, and mild disc bulge. Mild facet and ligament flavum hypertrophy. No stenosis.  T11-T12:  Negative.  T12-L1: Negative aside from chronic right perineural cyst (series 4, image 6 and series 5, image 9) which is unchanged and of doubtful significance.  L1-L2:  Negative disc.  Mild facet hypertrophy.  No stenosis.  L2-L3: Chronic disc desiccation and posterior disc space loss with broad-based posterior disc extrusion, now slightly more eccentric to the right (series 5, image 22). Stable mild facet and ligament flavum hypertrophy. Mildly increased mass effect on the ventral thecal sac, but no significant spinal or lateral recess stenosis. Borderline to mild L2 foraminal stenosis appears slightly increased.  L3-L4: Chronic mild disc desiccation and circumferential disc bulge. Broad-based posterior component of disc appears stable. Moderate facet and ligament flavum hypertrophy is stable to mildly increased. Borderline to mild spinal stenosis has not significantly changed. Borderline to mild L3 foraminal stenosis appears stable.  L4-L5:  Sequelae of decompression and fusion, unchanged.  L5-S1: Small central annular fissure of the disc is stable to slightly regressed (series 2, image 9). Stable mild facet hypertrophy. Otherwise negative, no stenosis.  IMPRESSION: 1. Chronic decompression and fusion at L4-L5 with solid arthrodesis. 2. 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 Borderline to mild spinal and foraminal stenosis  at the former. 3. Mild progression of disc disease at L2-L3, with increased mass effect on the thecal sac but no significant spinal or lateral recess stenosis. Mild bilateral L2 foraminal stenosis does appear slightly increased. 4. 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   Electronically Signed By: Odessa Fleming M.D. On: 02/22/2016 15:35  Lumbar DG Bending views: Results for orders placed during the hospital encounter of 06/19/23 DG Lumbar Spine  Complete W/Bend  Narrative CLINICAL DATA:  Low back pain with a possible radicular component.  EXAM: LUMBAR SPINE - COMPLETE WITH BENDING VIEWS  COMPARISON:  03/16/2013  FINDINGS: Five non-rib-bearing lumbar vertebrae. Stable interbody and pedicle screw and rod fixation at the L4-5 level with normal alignment. Mild facet degenerative changes with interval mild grade 1 anterolisthesis at the L3-4 level. This measures 4 mm in the neutral position, 3 mm with flexion and 3 mm with extension. Minimal anterior spur formation at the L1-2 and L2-3 levels and mild anterior spur formation at the L3-4 level. No fractures or pars defects.  IMPRESSION: 1. Stable postoperative changes at the L4-5 level. 2. Mild facet degenerative changes with interval mild grade 1 anterolisthesis at the L3-4 level. No significant motion with flexion or extension. 3. No fractures.   Electronically Signed By: Beckie Salts M.D. On: 06/25/2023 17:11  Hip Imaging: Hip-R DG 2-3 views: Results for orders placed during the hospital encounter of 06/19/23 DG HIP UNILAT W OR W/O PELVIS 2-3 VIEWS RIGHT  Narrative CLINICAL DATA:  Right hip pain/arthralgia. Possible radicular component.  EXAM: DG HIP (WITH OR WITHOUT PELVIS) 2-3V RIGHT  COMPARISON:  Abdomen and pelvis CT dated 06/22/2019.  FINDINGS: Minimal right femoral head and neck junction spur formation. Otherwise, normal-appearing right hip. Normal appearing left hip and pelvic bones.  Lower lumbar spine fixation hardware.  IMPRESSION: Minimal right hip degenerative changes.   Electronically Signed By: Beckie Salts M.D. On: 06/25/2023 17:14  Knee Imaging: Knee-R DG 4 views: Results for orders placed in visit on 09/01/20 DG Knee Complete 4 Views Right  Narrative CLINICAL DATA:  Bilateral knee pain  EXAM: RIGHT KNEE - COMPLETE 4+ VIEW  COMPARISON:  None.  FINDINGS: No fracture or malalignment. Mild to moderate medial and patellofemoral joint space degenerative change. No significant knee effusion  IMPRESSION: Mild to moderate arthritis.  No acute osseous abnormality   Electronically Signed By: Jasmine Pang M.D. On: 09/02/2020 00:19  Knee-L DG 4 views: Results for orders placed in visit on 09/01/20 DG Knee Complete 4 Views Left  Narrative CLINICAL DATA:  Bilateral knee pain  EXAM: LEFT KNEE - COMPLETE 4+ VIEW  COMPARISON:  None.  FINDINGS: No fracture or malalignment. Mild medial and patellofemoral joint space degenerative changes. No significant knee effusion.  IMPRESSION: Mild degenerative changes.   Electronically Signed By: Jasmine Pang M.D. On: 09/02/2020 00:18  Ankle Imaging: Ankle-L DG Complete: Results for orders placed during the hospital encounter of 03/10/21 DG Ankle Complete Left  Narrative CLINICAL DATA:  Swelling, discoloration, possible insect bite  EXAM: LEFT ANKLE COMPLETE - 3+ VIEW  COMPARISON:  None.  FINDINGS: Soft tissue swelling about the ankle is most pronounced laterally. No sizeable joint effusion. No soft tissue gas or foreign body. No acute bony abnormality. Specifically, no fracture, subluxation, or dislocation. Degenerative changes in the included ankle and hindfoot. Bidirectional calcaneal spurs. No conspicuous osseous lesions are radiographic features to suggest osteomyelitis.  IMPRESSION: Soft tissue swelling about the ankle is most pronounced laterally. No soft tissue gas or foreign  body. No acute osseous abnormality.   Electronically Signed By: Kreg Shropshire M.D. On: 03/10/2021 06:17  Complexity Note: Imaging results reviewed.                         Meds   Current Outpatient Medications:    ACCU-CHEK GUIDE test strip, USE TO CHECK BLOOD SUGAR UP TO 4 TIMES A DAY AS  DIRECTED, Disp: 100 strip, Rfl: 1   Accu-Chek Softclix Lancets lancets, SMARTSIG:Topical 1-4 Times Daily, Disp: , Rfl:    atorvastatin (LIPITOR) 40 MG tablet, Take 1 tablet by mouth every morning., Disp: , Rfl:    blood glucose meter kit and supplies, Dispense Accu-Chek Guide. Use up to four times daily as directed. (FOR ICD-10 E11.22 and N18.32)., Disp: 1 each, Rfl: 0   calcitRIOL (ROCALTROL) 0.25 MCG capsule, Take 0.25 mcg by mouth daily., Disp: , Rfl:    diazoxide (PROGLYCEM) 50 MG/ML suspension, take 1ml (50mg  total) BY MOUTH EVERY 8 HOURS, Disp: , Rfl:    gabapentin (NEURONTIN) 300 MG capsule, TAKE ONE CAPSULE BY MOUTH THREE TIMES DAILY, Disp: 270 capsule, Rfl: 1   hydrOXYzine (VISTARIL) 100 MG capsule, TAKE ONE CAPSULE BY MOUTH THREE TIMES DAILY, Disp: 270 capsule, Rfl: 1   levETIRAcetam (KEPPRA) 500 MG tablet, Take 1 tablet (500 mg total) by mouth 2 (two) times daily., Disp: 60 tablet, Rfl: 3   LINZESS 290 MCG CAPS capsule, TAKE ONE CAPSULE BY MOUTH BEFORE BREAKFAST daily, Disp: 90 capsule, Rfl: 0   metoprolol succinate (TOPROL-XL) 25 MG 24 hr tablet, TAKE ONE TABLET BY MOUTH EVERY MORNING, Disp: 90 tablet, Rfl: 1   mirtazapine (REMERON SOL-TAB) 15 MG disintegrating tablet, TAKE 1 TABLET BY MOUTH EVERYDAY AT BEDTIME, Disp: , Rfl:    mirtazapine (REMERON) 30 MG tablet, Take 1 tablet by mouth at bedtime., Disp: , Rfl:    rosuvastatin (CRESTOR) 40 MG tablet, Take 1 tablet (40 mg total) by mouth daily., Disp: 90 tablet, Rfl: 3   triamcinolone cream (KENALOG) 0.5 %, Apply 1 Application topically 3 (three) times daily as needed (flares)., Disp: , Rfl:   ROS  Constitutional: Denies any fever or  chills Gastrointestinal: No reported hemesis, hematochezia, vomiting, or acute GI distress Musculoskeletal: Denies any acute onset joint swelling, redness, loss of ROM, or weakness Neurological: No reported episodes of acute onset apraxia, aphasia, dysarthria, agnosia, amnesia, paralysis, loss of coordination, or loss of consciousness  Allergies  Ms. Fingar is allergic to lotrel [amlodipine besy-benazepril hcl], contrast media [iodinated contrast media], niacin, and sulfa antibiotics.  PFSH  Drug: Ms. Piner  reports no history of drug use. Alcohol:  reports no history of alcohol use. Tobacco:  reports that she has never smoked. She has never used smokeless tobacco. Medical:  has a past medical history of Anemia, Arthritis, COVID-19 (11/19/2019), Diverticulitis, DM (diabetes mellitus) (HCC), History of methicillin resistant staphylococcus aureus (MRSA) (2017), HTN (hypertension), Hyperlipidemia, L-S radiculopathy (06/11/2015), Left leg swelling (01/22/2023), Lower abdominal pain (08/08/2022), Memory difficulties (09/01/2015), Multiple thyroid nodules, Myocardial infarction (HCC) (1995), Short-term memory loss, and Thyroid nodule. Surgical: Ms. Walega  has a past surgical history that includes Hemicolectomy; Lumbar disc surgery; Roux-en-Y Gastric Bypass (2010); Cholecystectomy; Eye surgery; Hand surgery; Foot surgery; Abdominal hysterectomy; Tonsillectomy; Upper gi endoscopy (01/12/2016); Cardiac catheterization (2000); Laparoscopic lysis of adhesions (N/A, 08/21/2016); Ventral hernia repair (N/A, 08/21/2016); Insertion of mesh (N/A, 08/21/2016); Cystocele repair (N/A, 03/19/2017); Cystoscopy (N/A, 03/19/2017); Laparoscopic removal abdominal mass; LOOP RECORDER INSERTION (N/A, 03/21/2018); laparotomy (N/A, 04/16/2019); XI robotic assisted ventral hernia (N/A, 12/08/2019); Insertion of mesh (N/A, 12/08/2019); Breast excisional biopsy (Right, 1971); Small intestine surgery; and Esophagogastroduodenoscopy  (egd) with propofol (N/A, 12/15/2021). Family: family history includes Breast cancer in her paternal grandmother; Breast cancer (age of onset: 36) in her paternal aunt; Breast cancer (age of onset: 57) in her paternal aunt; COPD in her mother; Cancer in her paternal aunt and paternal grandmother; Cancer (age of  onset: 75) in her father; Congestive Heart Failure in her maternal grandmother; Coronary artery disease (age of onset: 43) in her father; Diabetes in her mother; Emphysema in her maternal grandfather; Heart attack in her paternal grandfather; Hypertension in her mother.  Constitutional Exam  General appearance: Well nourished, well developed, and well hydrated. In no apparent acute distress There were no vitals filed for this visit. BMI Assessment: Estimated body mass index is 24.8 kg/m as calculated from the following:   Height as of 06/19/23: 5\' 3"  (1.6 m).   Weight as of 06/19/23: 140 lb (63.5 kg).  BMI interpretation table: BMI level Category Range association with higher incidence of chronic pain  <18 kg/m2 Underweight   18.5-24.9 kg/m2 Ideal body weight   25-29.9 kg/m2 Overweight Increased incidence by 20%  30-34.9 kg/m2 Obese (Class I) Increased incidence by 68%  35-39.9 kg/m2 Severe obesity (Class II) Increased incidence by 136%  >40 kg/m2 Extreme obesity (Class III) Increased incidence by 254%   Patient's current BMI Ideal Body weight  There is no height or weight on file to calculate BMI. Patient weight not recorded   BMI Readings from Last 4 Encounters:  06/19/23 24.80 kg/m  05/16/23 24.92 kg/m  04/11/23 24.09 kg/m  04/02/23 24.27 kg/m   Wt Readings from Last 4 Encounters:  06/19/23 140 lb (63.5 kg)  05/16/23 140 lb 11.2 oz (63.8 kg)  04/11/23 136 lb (61.7 kg)  04/02/23 137 lb (62.1 kg)    Psych/Mental status: Alert, oriented x 3 (person, place, & time)       Eyes: PERLA Respiratory: No evidence of acute respiratory distress  Assessment & Plan  Primary  Diagnosis & Pertinent Problem List: The primary encounter diagnosis was Chronic low back pain (1ry area of Pain) (Bilateral) (R>L) w/o sciatica. Diagnoses of Chronic lower extremity pain (2ry area of Pain) (Right), Chronic hip pain (3ry area of Pain) (Right), Chronic low back pain of over 3 months duration, Failed back surgical syndrome, and Abnormal MRI, lumbar spine (02/22/2016) were also pertinent to this visit.  Visit Diagnosis: 1. Chronic low back pain (1ry area of Pain) (Bilateral) (R>L) w/o sciatica   2. Chronic lower extremity pain (2ry area of Pain) (Right)   3. Chronic hip pain (3ry area of Pain) (Right)   4. Chronic low back pain of over 3 months duration   5. Failed back surgical syndrome   6. Abnormal MRI, lumbar spine (02/22/2016)    Problems updated and reviewed during this visit: No problems updated.  Plan of Care  Pharmacotherapy (Medications Ordered): No orders of the defined types were placed in this encounter.  Procedure Orders    No procedure(s) ordered today   Lab Orders  No laboratory test(s) ordered today   Imaging Orders  No imaging studies ordered today   Referral Orders  No referral(s) requested today    Pharmacological management:  Opioid Analgesics: I will not be prescribing any opioids at this time Membrane stabilizer: I will not be prescribing any at this time Muscle relaxant: I will not be prescribing any at this time NSAID: I will not be prescribing any at this time Other analgesic(s): I will not be prescribing any at this time      Interventional Therapies  Risk Factors  Considerations:  Cardiologist: Dorothyann Peng, MD ([facility])  Neurologist: Cristopher Peru, MD [facility]) Hx of MI (1995)  Hx of TIAs  T2DM  Stage 3b CKD  [name]  Memory impairment  Hx of Bowel  Obstruction (04/2019)  GERD  s/p Gastric Bypass for obesity  Malnutrition-malabsorption  Hx. Seizures   Planned  Pending:      Under  consideration:   Diagnostic bilateral lumbar facet MBB #1  Diagnostic/therapeutic right IA hip joint injection #1    Completed:   None at this time   Therapeutic  Palliative (PRN) options:   None established   Completed by other providers:   Therapeutic/diagnostic right L5 TFESI x2 (04/11/2016, 05/02/2016) by Merri Ray, DO [facility])  Therapeutic/diagnostic right S1 TFESI x2 (04/11/2016, 05/02/2016) by Merri Ray, DO [facility])  Therapeutic/diagnostic right L3 TFESI x1 (07/19/2016) by Merri Ray, DO [facility])        Provider-requested follow-up: No follow-ups on file. Recent Visits Date Type Provider Dept  06/19/23 Office Visit Delano Metz, MD [facility]  Showing recent visits within past 90 days and meeting all other requirements Future Appointments Date Type Provider Dept  08/21/23 Appointment Delano Metz, MD [facility]  Showing future appointments within next 90 days and meeting all other requirements   Primary Care Physician: [name], PA-C  Duration of encounter: *** minutes.  Total time on encounter, as per AMA guidelines included both the face-to-face and non-face-to-face time personally spent by the physician and/or other qualified health care professional(s) on the day of the encounter (includes time in activities that require the physician or other qualified health care professional and does not include time in activities normally performed by clinical staff). Physician's time may include the following activities when performed: Preparing to see the patient (e.g., pre-charting review of records, searching for previously ordered imaging, lab work, and nerve conduction tests) Review of prior analgesic pharmacotherapies. Reviewing PMP Interpreting ordered tests (e.g., lab work, imaging, nerve conduction tests) Performing post-procedure evaluations, including interpretation of diagnostic procedures Obtaining and/or  reviewing separately obtained history Performing a medically appropriate examination and/or evaluation Counseling and educating the patient/family/caregiver Ordering medications, tests, or procedures Referring and communicating with other health care professionals (when not separately reported) Documenting clinical information in the electronic or other health record Independently interpreting results (not separately reported) and communicating results to the patient/ family/caregiver Care coordination (not separately reported)  Note by: Oswaldo Done, MD (TTS technology used. I apologize for any typographical errors that were not detected and corrected.) Date: 08/21/2023; Time: 2:55 PM

## 2023-08-16 ENCOUNTER — Ambulatory Visit: Payer: 59 | Admitting: Physician Assistant

## 2023-08-21 ENCOUNTER — Ambulatory Visit: Payer: 59 | Attending: Pain Medicine | Admitting: Pain Medicine

## 2023-08-21 ENCOUNTER — Encounter: Payer: Self-pay | Admitting: Pain Medicine

## 2023-08-21 ENCOUNTER — Other Ambulatory Visit: Payer: Self-pay | Admitting: Family Medicine

## 2023-08-21 ENCOUNTER — Other Ambulatory Visit: Payer: Self-pay | Admitting: Physician Assistant

## 2023-08-21 VITALS — BP 153/78 | HR 62 | Temp 97.5°F | Ht 63.0 in | Wt 139.0 lb

## 2023-08-21 DIAGNOSIS — M5030 Other cervical disc degeneration, unspecified cervical region: Secondary | ICD-10-CM

## 2023-08-21 DIAGNOSIS — R937 Abnormal findings on diagnostic imaging of other parts of musculoskeletal system: Secondary | ICD-10-CM | POA: Diagnosis not present

## 2023-08-21 DIAGNOSIS — M5459 Other low back pain: Secondary | ICD-10-CM

## 2023-08-21 DIAGNOSIS — M961 Postlaminectomy syndrome, not elsewhere classified: Secondary | ICD-10-CM

## 2023-08-21 DIAGNOSIS — M545 Low back pain, unspecified: Secondary | ICD-10-CM | POA: Insufficient documentation

## 2023-08-21 DIAGNOSIS — M76891 Other specified enthesopathies of right lower limb, excluding foot: Secondary | ICD-10-CM

## 2023-08-21 DIAGNOSIS — M47816 Spondylosis without myelopathy or radiculopathy, lumbar region: Secondary | ICD-10-CM | POA: Diagnosis not present

## 2023-08-21 DIAGNOSIS — M79604 Pain in right leg: Secondary | ICD-10-CM | POA: Diagnosis not present

## 2023-08-21 DIAGNOSIS — Z981 Arthrodesis status: Secondary | ICD-10-CM

## 2023-08-21 DIAGNOSIS — M25551 Pain in right hip: Secondary | ICD-10-CM | POA: Diagnosis not present

## 2023-08-21 DIAGNOSIS — G8929 Other chronic pain: Secondary | ICD-10-CM | POA: Diagnosis not present

## 2023-08-21 DIAGNOSIS — M4316 Spondylolisthesis, lumbar region: Secondary | ICD-10-CM

## 2023-08-21 DIAGNOSIS — M5136 Other intervertebral disc degeneration, lumbar region: Secondary | ICD-10-CM

## 2023-08-21 DIAGNOSIS — M47817 Spondylosis without myelopathy or radiculopathy, lumbosacral region: Secondary | ICD-10-CM | POA: Diagnosis not present

## 2023-08-21 NOTE — Patient Instructions (Signed)

## 2023-08-21 NOTE — Progress Notes (Signed)
Safety precautions to be maintained throughout the outpatient stay will include: orient to surroundings, keep bed in low position, maintain call bell within reach at all times, provide assistance with transfer out of bed and ambulation.  

## 2023-08-22 ENCOUNTER — Telehealth: Payer: Self-pay | Admitting: Physician Assistant

## 2023-08-22 DIAGNOSIS — I10 Essential (primary) hypertension: Secondary | ICD-10-CM

## 2023-08-22 MED ORDER — METOPROLOL SUCCINATE ER 25 MG PO TB24
25.0000 mg | ORAL_TABLET | Freq: Every morning | ORAL | 1 refills | Status: DC
Start: 2023-08-22 — End: 2023-08-22

## 2023-08-22 MED ORDER — METOPROLOL SUCCINATE ER 25 MG PO TB24
25.0000 mg | ORAL_TABLET | Freq: Every morning | ORAL | 1 refills | Status: DC
Start: 2023-08-22 — End: 2024-02-26

## 2023-08-22 NOTE — Telephone Encounter (Signed)
[facility] faxed refill request for the following medications:   metoprolol succinate (TOPROL-XL) 25 MG 24 hr tablet     Please advise.

## 2023-08-26 DIAGNOSIS — H189 Unspecified disorder of cornea: Secondary | ICD-10-CM | POA: Diagnosis not present

## 2023-08-26 DIAGNOSIS — H5789 Other specified disorders of eye and adnexa: Secondary | ICD-10-CM | POA: Diagnosis not present

## 2023-08-27 DIAGNOSIS — R519 Headache, unspecified: Secondary | ICD-10-CM | POA: Diagnosis not present

## 2023-08-27 DIAGNOSIS — Z87828 Personal history of other (healed) physical injury and trauma: Secondary | ICD-10-CM | POA: Diagnosis not present

## 2023-09-03 ENCOUNTER — Other Ambulatory Visit: Payer: Self-pay | Admitting: Neurology

## 2023-09-03 DIAGNOSIS — M5481 Occipital neuralgia: Secondary | ICD-10-CM

## 2023-09-04 IMAGING — CT CT HEAD W/O CM
3 of 4 series · 12 of 47 positions shown, 14 images · non-contrast
Comparison: Brain MRI 02/18/2021. Head CT 03/18/2018. Neck CT
06/19/2021.

CLINICAL DATA: Dombuldor body dementia without behavioral disturbance.
Additional history provided by scanning technologist: Patient
reports newly diagnosed dementia, fall last week hitting back of
head, headaches, imbalance and dizziness, history of TIA in 0338.

EXAM:
CT HEAD WITHOUT CONTRAST
TECHNIQUE: Contiguous axial images were obtained from the base of the skull
through the vertex without intravenous contrast.

[Series 2: axial st head 5.00 ax · axial · 0.31mm/px · z∈[-565,-470]mm · 6 of 27 slices shown, 8 images]
[im 4/27  brain]
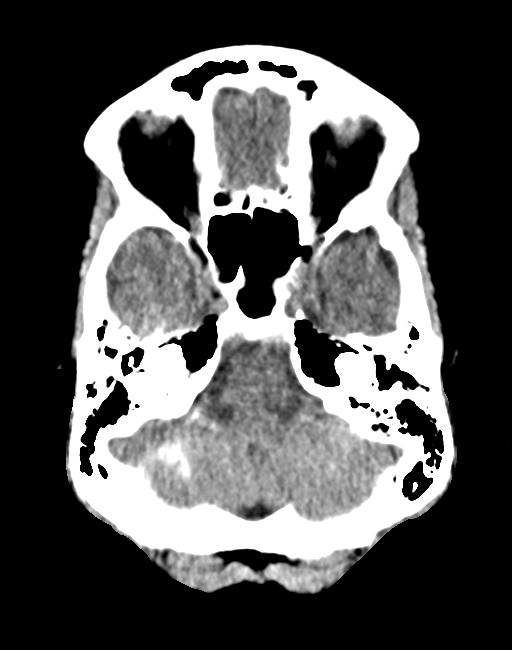
[im 4/27  bone]
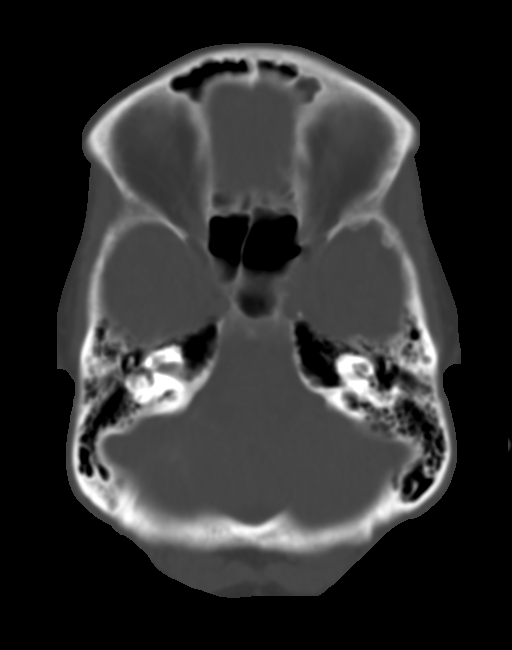
[im 8/27  brain]
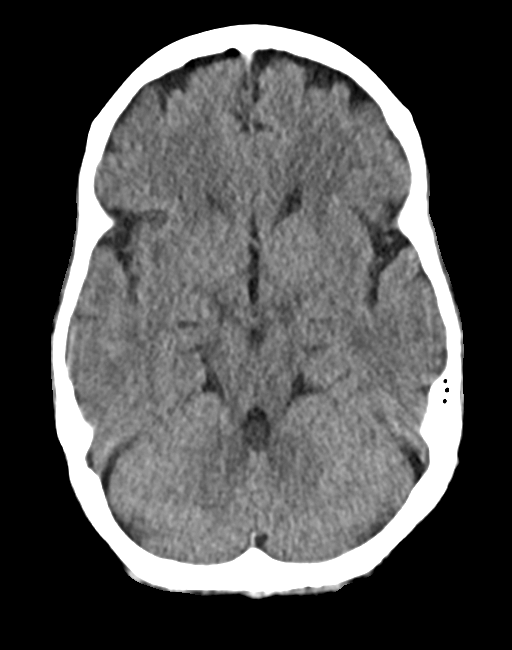
[im 12/27  brain]
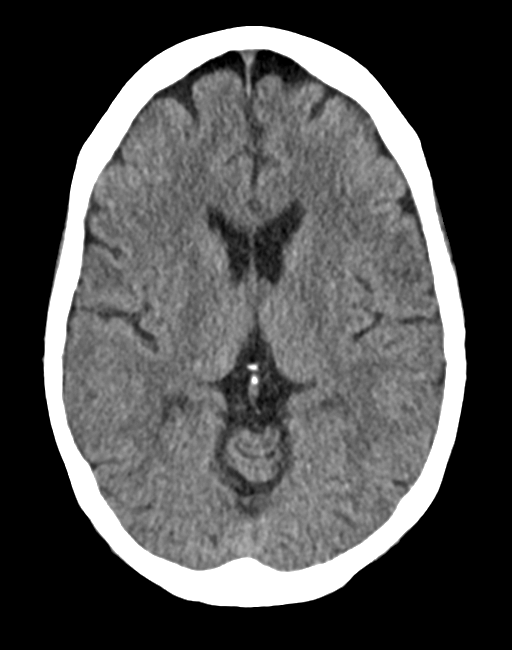
[im 15/27  brain]
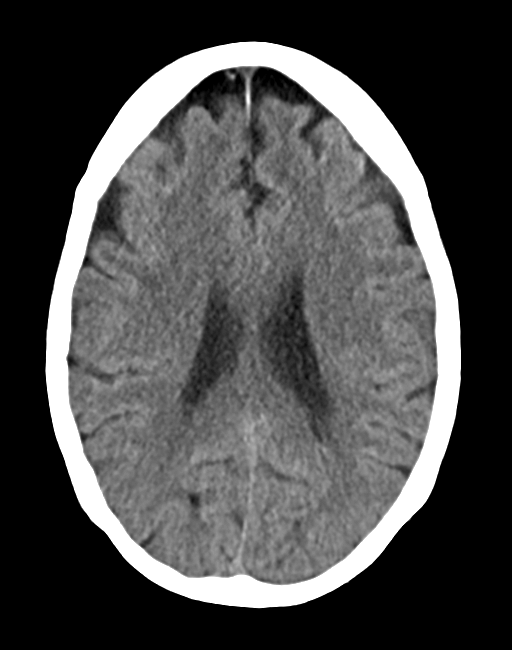
[im 19/27  brain]
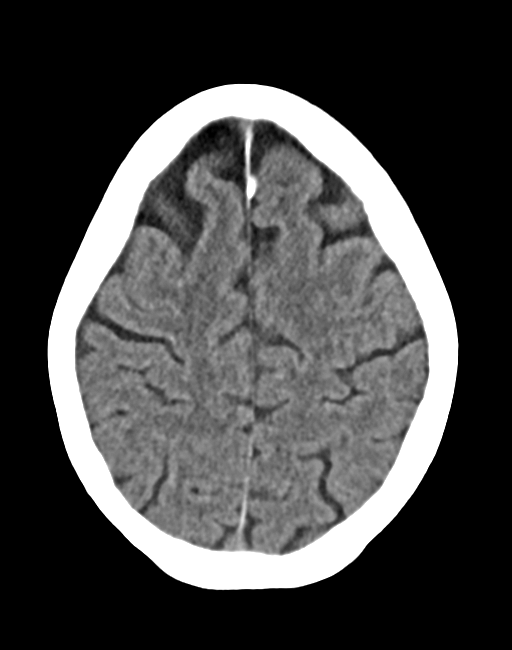
[im 19/27  bone]
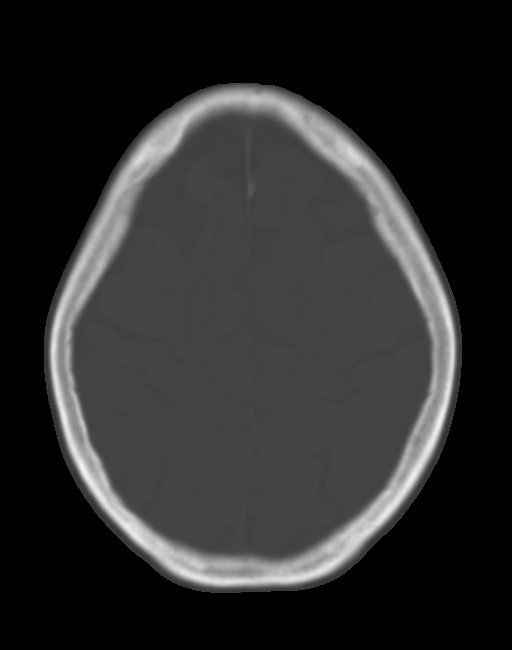
[im 23/27  brain]
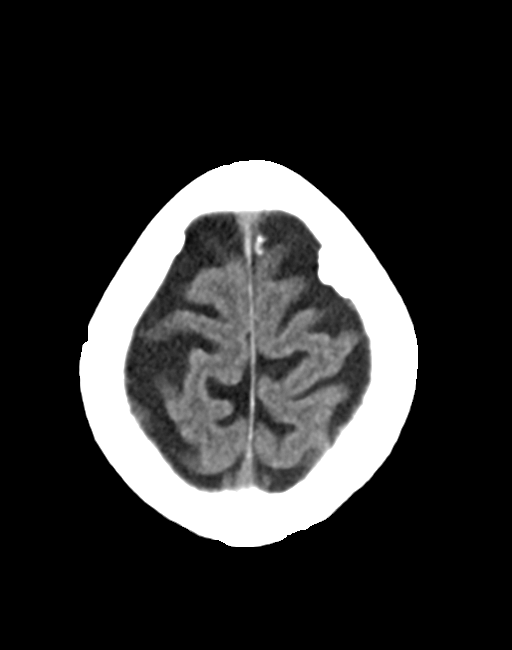

[Series 6: coronals head 3.00 cor · coronal · 0.27mm/px · 3 of 66 slices shown]
[im 22/66  brain]
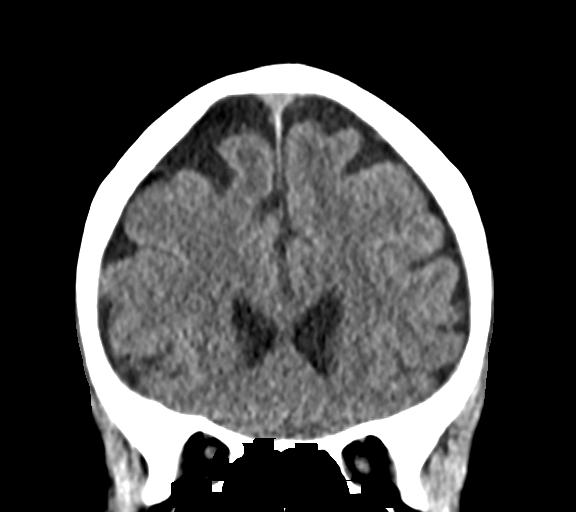
[im 29/66  brain]
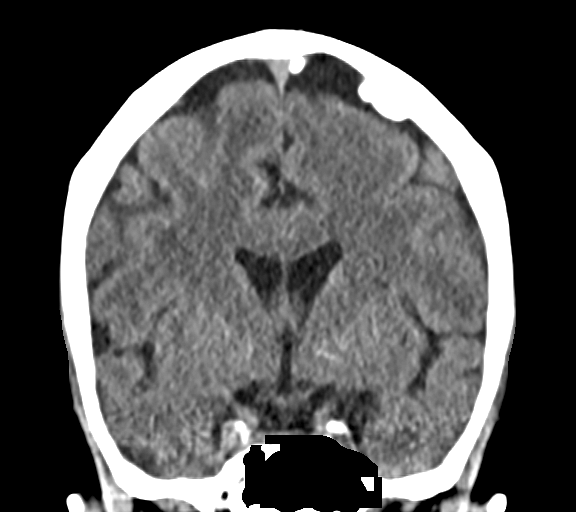
[im 37/66  brain]
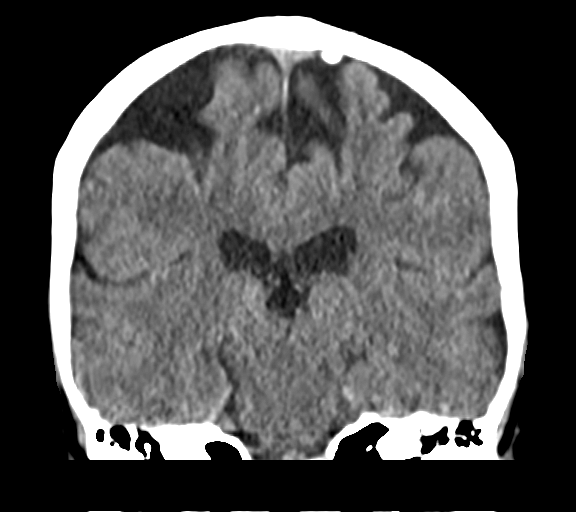

[Series 8: sagittals head 3.00 sag · sagittal · 0.27mm/px · 3 of 52 slices shown]
[im 18/52  brain]
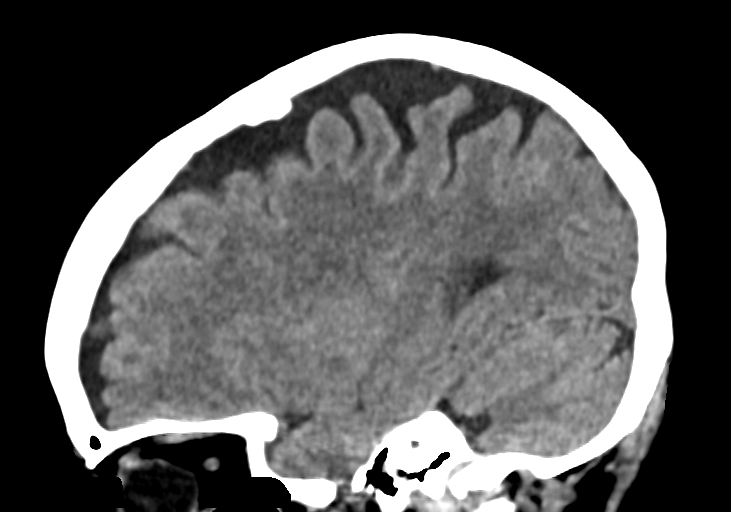
[im 26/52  brain]
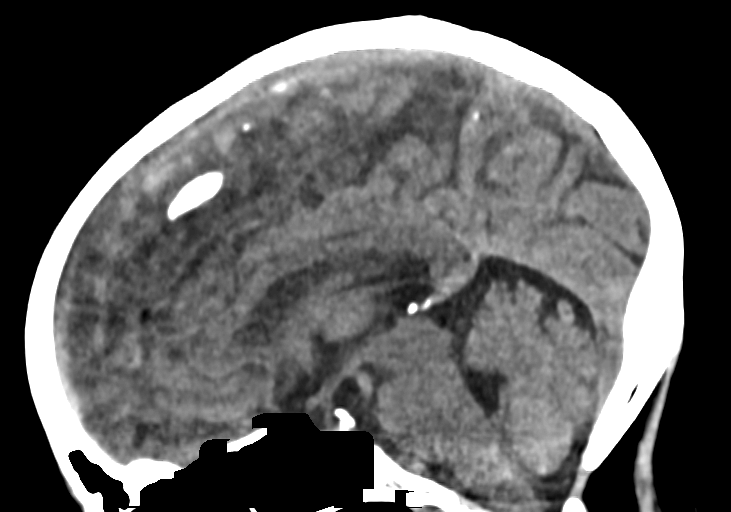
[im 35/52  brain]
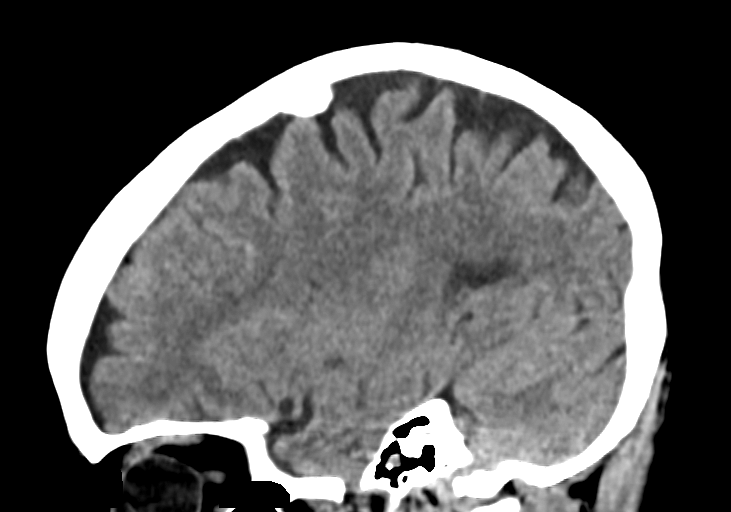

[12 of 47 positions shown; findings below may reference images not displayed]

FINDINGS: Brain:

Mild cerebral atrophy without appreciable lobar predominance.

Known mild chronic small-vessel ischemic changes within the cerebral
white matter, better appreciated on the brain MRI of 02/18/2021.

There is no acute intracranial hemorrhage.

No demarcated cortical infarct.

No extra-axial fluid collection.

No evidence of an intracranial mass.

No midline shift.

Vascular: No hyperdense vessel.  Atherosclerotic calcifications

Skull: Normal. Negative for fracture or focal lesion.

Sinuses/Orbits: Visualized orbits show no acute finding. No
significant paranasal sinus disease at the imaged levels.
IMPRESSION: No evidence of acute intracranial abnormality.

Mild cerebral atrophy without appreciable lobar predominance, not
significantly changed from the brain MRI of 02/18/2021.

Mild chronic small-vessel ischemic disease within the cerebral white
matter.

## 2023-09-06 ENCOUNTER — Ambulatory Visit
Admission: RE | Admit: 2023-09-06 | Discharge: 2023-09-06 | Disposition: A | Discharge status: Home / Self Care | Payer: 59 | Source: Ambulatory Visit | Attending: Neurology | Admitting: Neurology

## 2023-09-06 DIAGNOSIS — R519 Headache, unspecified: Secondary | ICD-10-CM | POA: Diagnosis not present

## 2023-09-06 DIAGNOSIS — M5481 Occipital neuralgia: Secondary | ICD-10-CM | POA: Insufficient documentation

## 2023-09-06 DIAGNOSIS — I6782 Cerebral ischemia: Secondary | ICD-10-CM | POA: Diagnosis not present

## 2023-09-10 ENCOUNTER — Ambulatory Visit: Payer: 59 | Attending: Pain Medicine | Admitting: Pain Medicine

## 2023-09-10 ENCOUNTER — Ambulatory Visit
Admission: RE | Admit: 2023-09-10 | Discharge: 2023-09-10 | Disposition: A | Discharge status: Home / Self Care | Payer: 59 | Source: Ambulatory Visit | Attending: Pain Medicine | Admitting: Pain Medicine

## 2023-09-10 ENCOUNTER — Encounter: Payer: Self-pay | Admitting: Pain Medicine

## 2023-09-10 VITALS — BP 148/88 | HR 73 | Temp 97.3°F | Resp 18 | Ht 63.0 in | Wt 139.0 lb

## 2023-09-10 DIAGNOSIS — M25551 Pain in right hip: Secondary | ICD-10-CM | POA: Diagnosis not present

## 2023-09-10 DIAGNOSIS — M961 Postlaminectomy syndrome, not elsewhere classified: Secondary | ICD-10-CM | POA: Diagnosis not present

## 2023-09-10 DIAGNOSIS — M545 Low back pain, unspecified: Secondary | ICD-10-CM | POA: Diagnosis not present

## 2023-09-10 DIAGNOSIS — M47817 Spondylosis without myelopathy or radiculopathy, lumbosacral region: Secondary | ICD-10-CM | POA: Insufficient documentation

## 2023-09-10 DIAGNOSIS — M4316 Spondylolisthesis, lumbar region: Secondary | ICD-10-CM | POA: Diagnosis not present

## 2023-09-10 DIAGNOSIS — M47816 Spondylosis without myelopathy or radiculopathy, lumbar region: Secondary | ICD-10-CM | POA: Diagnosis not present

## 2023-09-10 DIAGNOSIS — R001 Bradycardia, unspecified: Secondary | ICD-10-CM | POA: Insufficient documentation

## 2023-09-10 DIAGNOSIS — M79604 Pain in right leg: Secondary | ICD-10-CM | POA: Insufficient documentation

## 2023-09-10 DIAGNOSIS — Z91041 Radiographic dye allergy status: Secondary | ICD-10-CM | POA: Insufficient documentation

## 2023-09-10 DIAGNOSIS — M5459 Other low back pain: Secondary | ICD-10-CM | POA: Diagnosis not present

## 2023-09-10 DIAGNOSIS — G8929 Other chronic pain: Secondary | ICD-10-CM | POA: Insufficient documentation

## 2023-09-10 DIAGNOSIS — E162 Hypoglycemia, unspecified: Secondary | ICD-10-CM | POA: Insufficient documentation

## 2023-09-10 LAB — GLUCOSE, CAPILLARY
Glucose-Capillary: 61 mg/dL — ABNORMAL LOW (ref 70–99)
Glucose-Capillary: 72 mg/dL (ref 70–99)
Glucose-Capillary: 85 mg/dL (ref 70–99)

## 2023-09-10 IMAGING — MR MR MRA HEAD W/O CM
1 series · 20 of 48 positions shown · non-contrast
Comparison: No pertinent prior exam.

CLINICAL DATA: Neuro deficit, acute, stroke suspected

EXAM:
MRA HEAD WITHOUT CONTRAST
TECHNIQUE: Angiographic images of the Circle of Willis were acquired using MRA
technique without intravenous contrast.

[Series 5: TOF · axial · 0.5mm · 0.41mm/px · z∈[-106,-8]mm · 20 of 205 slices shown]
[im 1/205]
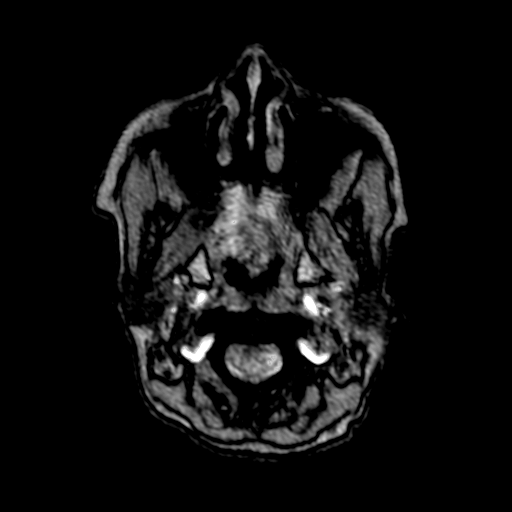
[im 5/205]
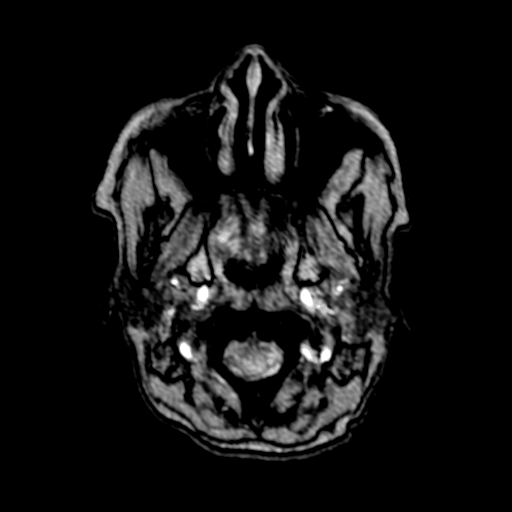
[im 9/205]
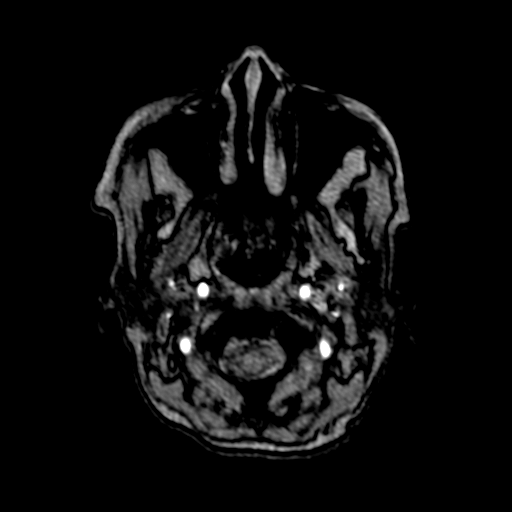
[im 14/205]
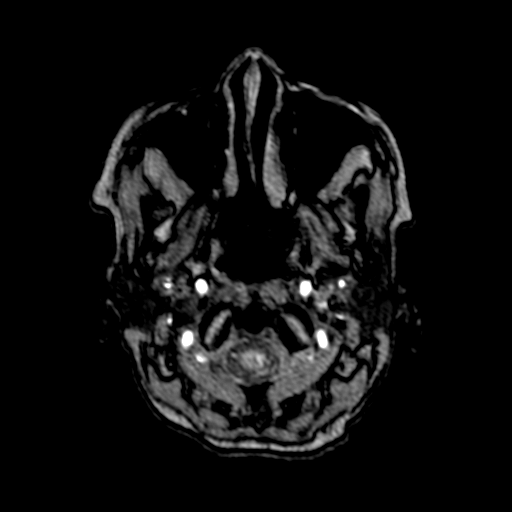
[im 18/205]
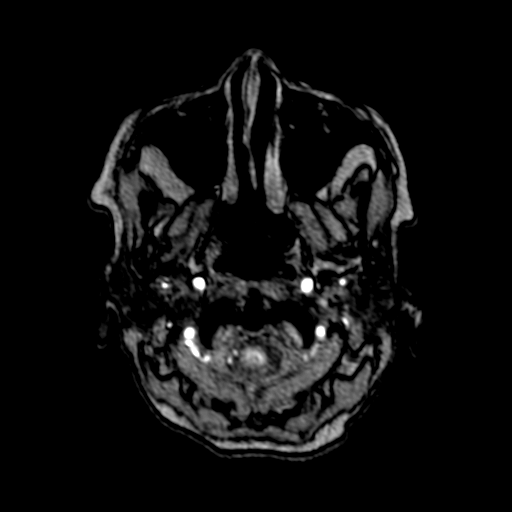
[im 22/205]
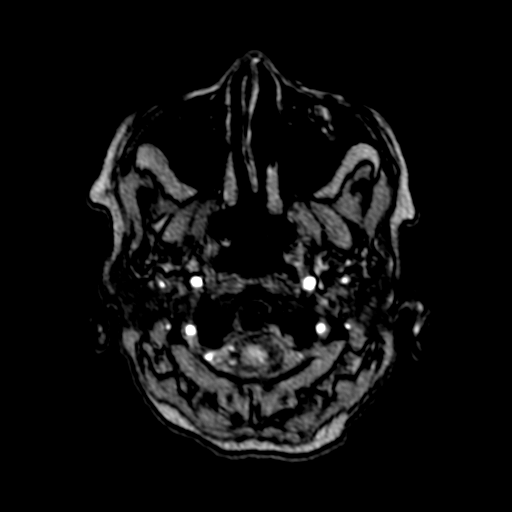
[im 27/205]
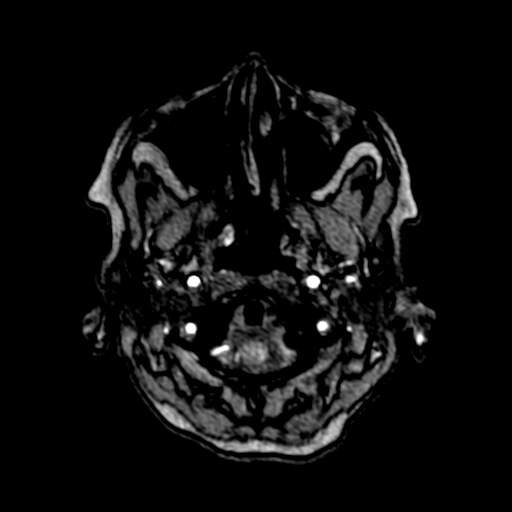
[im 31/205]
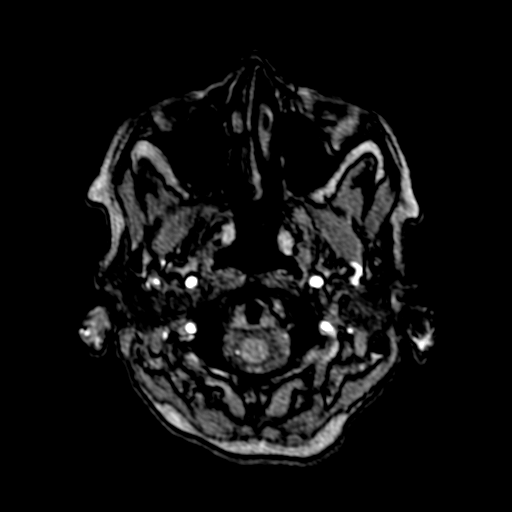
[im 35/205]
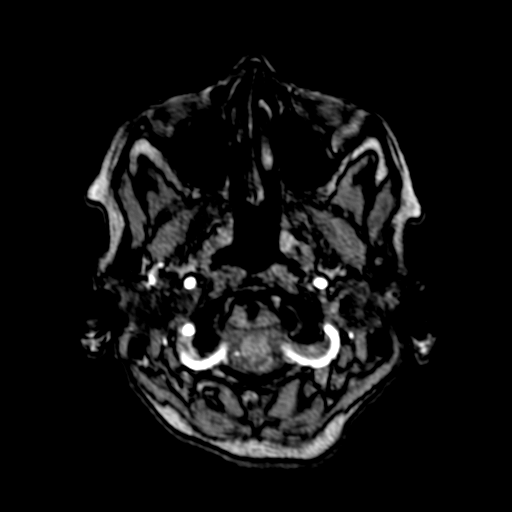
[im 40/205]
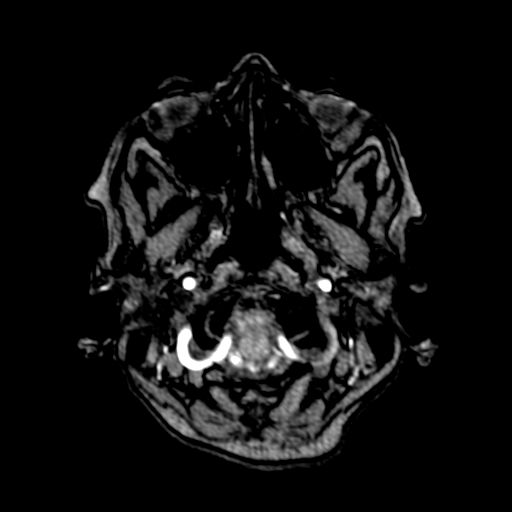
[im 44/205]
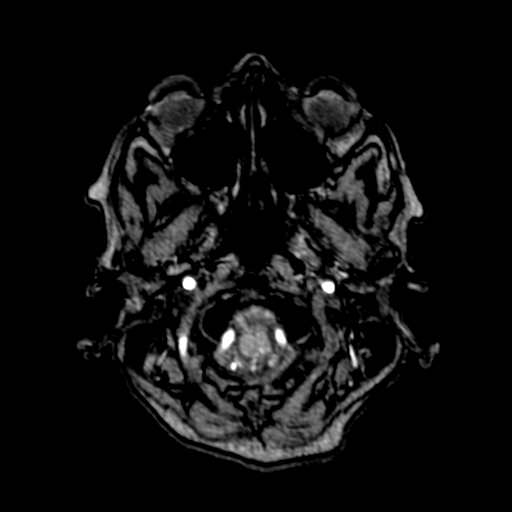
[im 48/205]
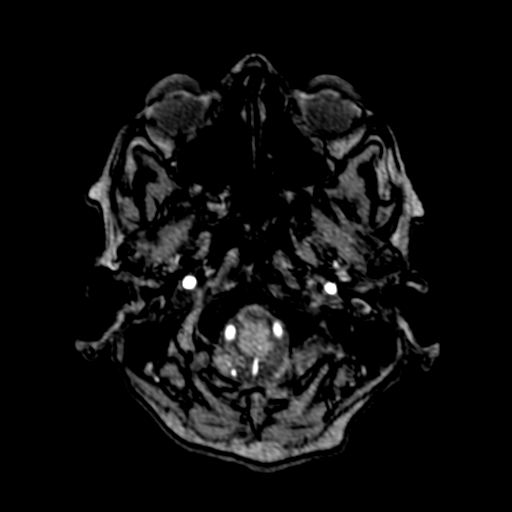
[im 66/205]
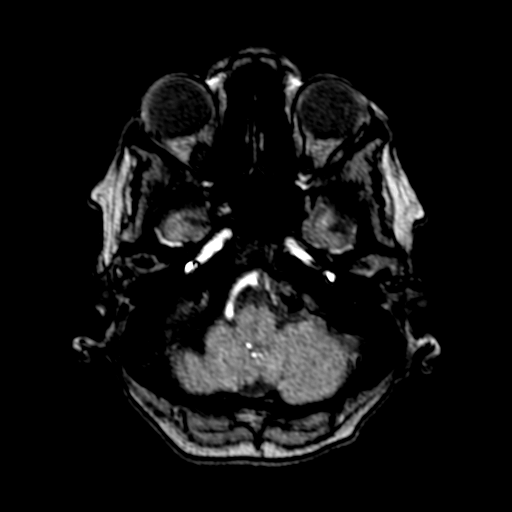
[im 92/205]
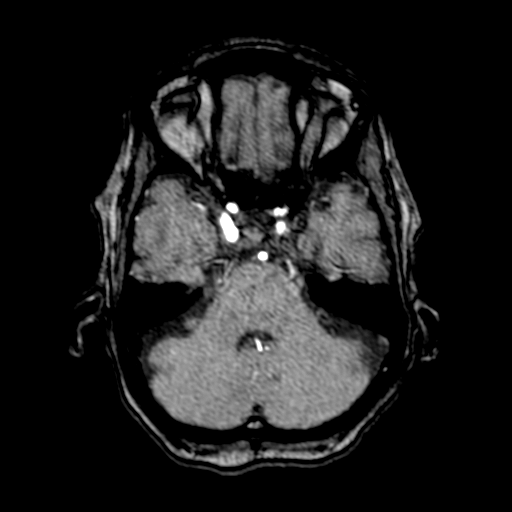
[im 105/205]
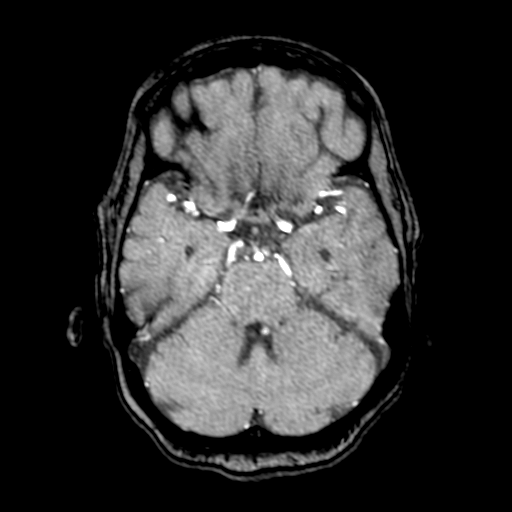
[im 118/205]
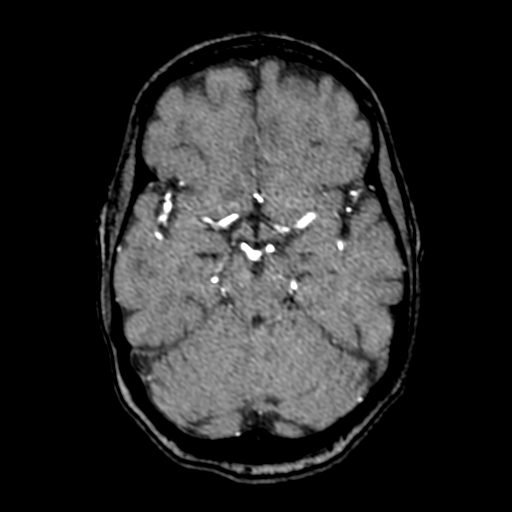
[im 144/205]
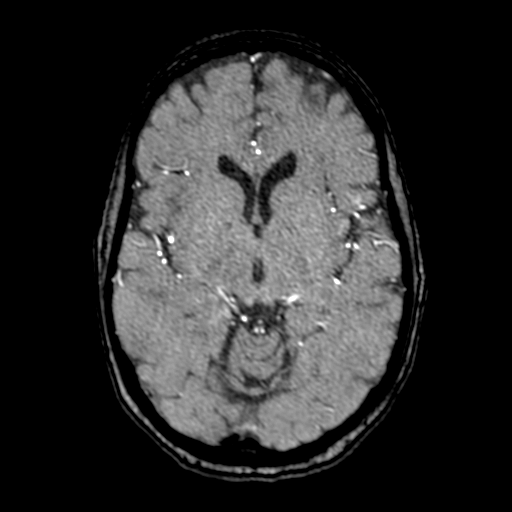
[im 170/205]
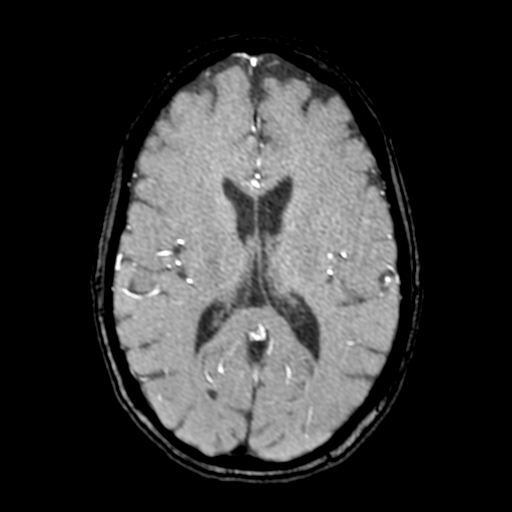
[im 174/205]
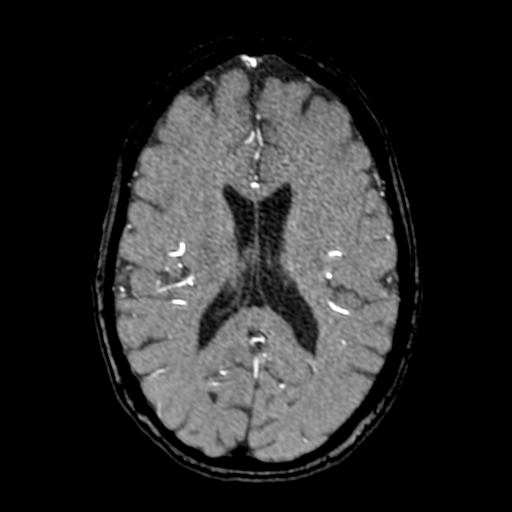
[im 196/205]
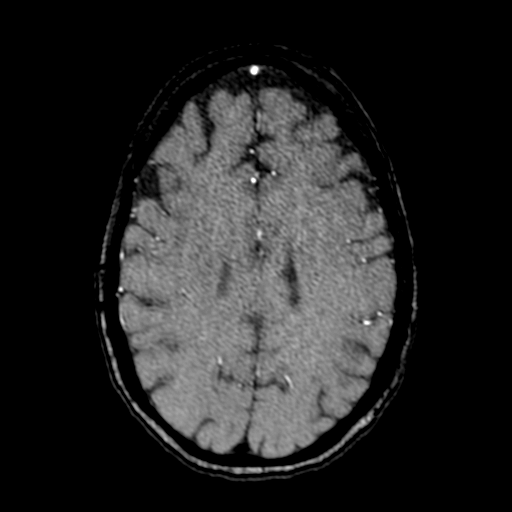

[20 of 48 positions shown; findings below may reference images not displayed]

FINDINGS: Anterior circulation: Bilateral intracranial ICAs, MCAs, and ACAS
are patent without proximal mid LAD significant stenosis. No
aneurysm identified.

Posterior circulation: Bilateral intradural vertebral arteries
basilar artery, and posterior cerebral arteries are patent without
proximal hemodynamically significant stenosis. No aneurysm
identified.
IMPRESSION: No large vessel occlusion or proximal hemodynamically significant
stenosis.

## 2023-09-10 IMAGING — MR MR HEAD W/O CM
11 series · 46 of 48 positions shown · non-contrast
Comparison: Prior head CT examinations 09/05/2021 and earlier.
Brain MRI 02/18/2021.

CLINICAL DATA: Neuro deficit, acute, stroke suspected. Additional
history provided: Acute right-sided weakness.

EXAM:
MRI HEAD WITHOUT CONTRAST
TECHNIQUE: Multiplanar, multiecho pulse sequences of the brain and surrounding
structures were obtained without intravenous contrast.

[Series 5: ax dwi_tracew · axial · 3.0mm · 0.65mm/px · z∈[-115,+26]mm · 3 of 46 slices shown]
[im 1/46]
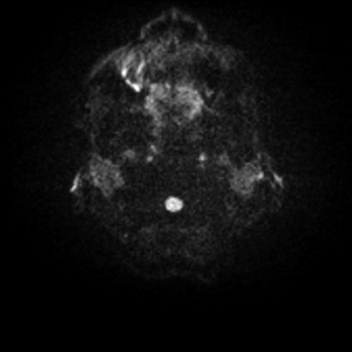
[im 23/46]
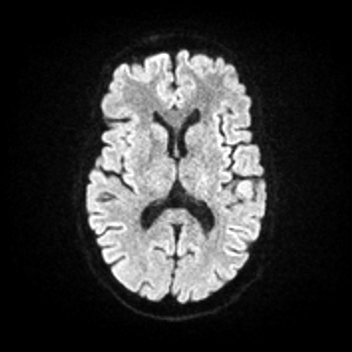
[im 46/46]
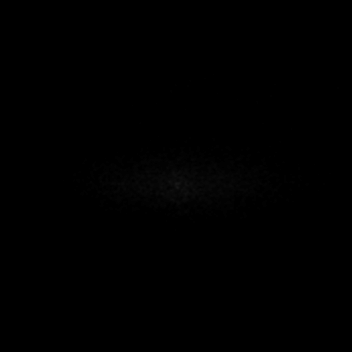

[Series 6: ax dwi_adc · axial · 3.0mm · 0.65mm/px · z∈[-115,+23]mm · 4 of 45 slices shown]
[im 1/45]
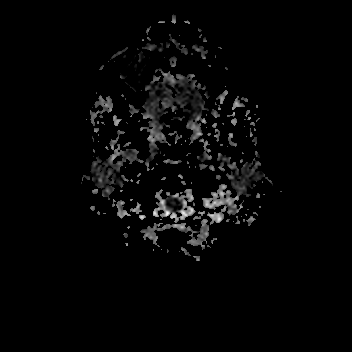
[im 15/45]
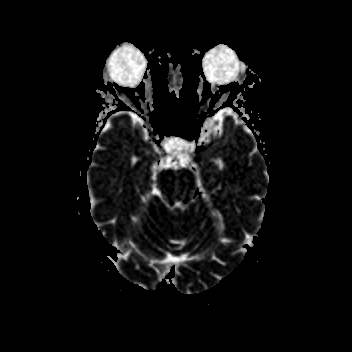
[im 30/45]
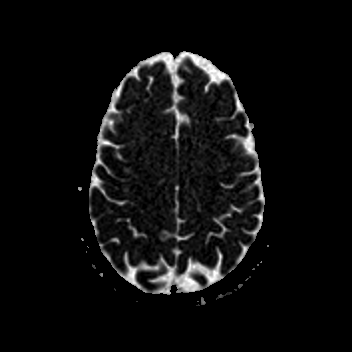
[im 45/45]
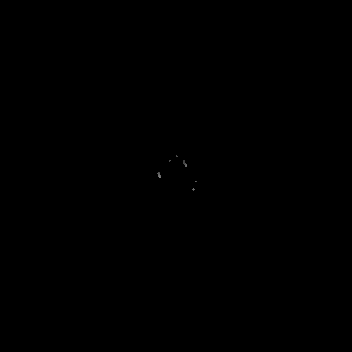

[Series 7: cor dwi_tracew · coronal · 5.0mm · 0.65mm/px · 3 of 34 slices shown]
[im 1/34]
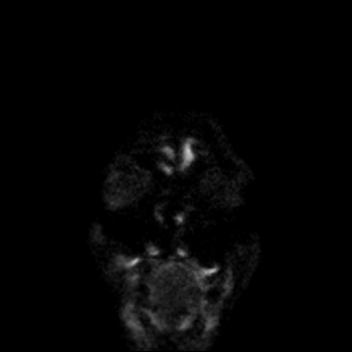
[im 17/34]
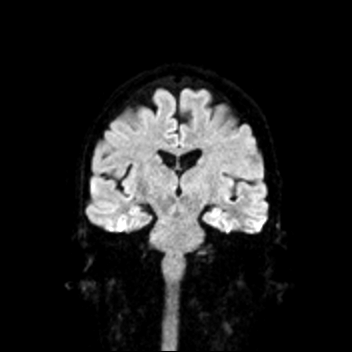
[im 34/34]
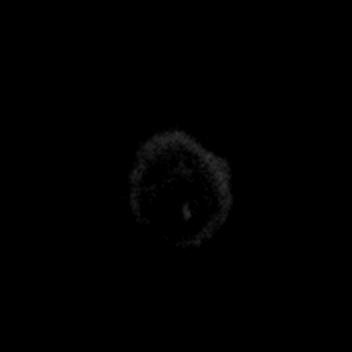

[Series 8: cor dwi_adc · coronal · 5.0mm · 0.65mm/px · 3 of 34 slices shown]
[im 1/34]
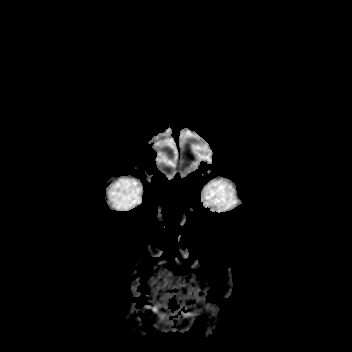
[im 17/34]
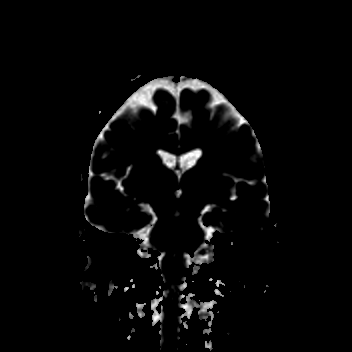
[im 34/34]
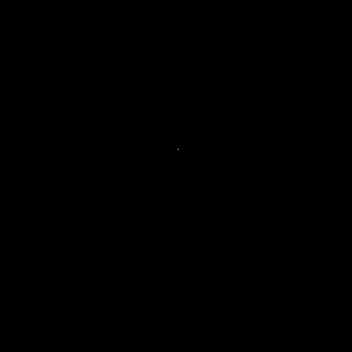

[Series 9: T1 · sagittal · 5.0mm · 0.62mm/px · 2 of 19 slices shown (1 of 2)]
[im 1/19]
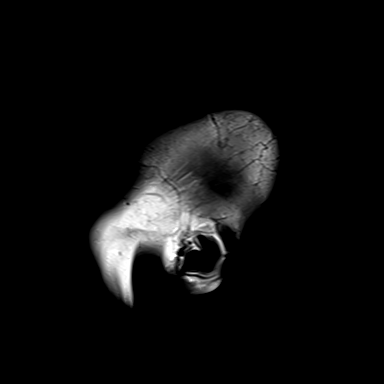
[im 19/19]
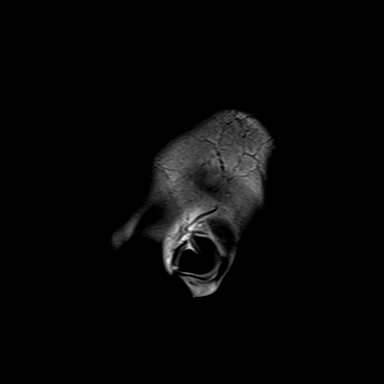

[Series 10: T2 · axial · 5.0mm · 0.53mm/px · z∈[-111,+26]mm · 2 of 25 slices shown (1 of 2)]
[im 1/25]
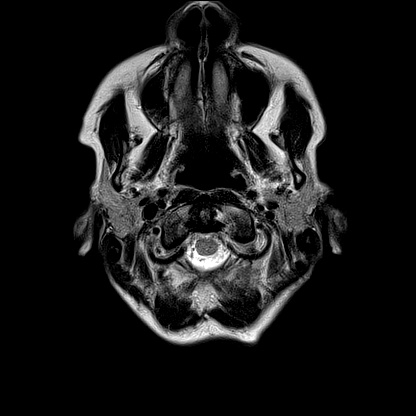
[im 25/25]
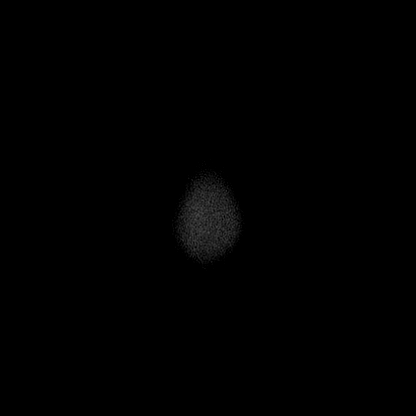

[Series 12: pha_images · axial · 3.0mm · 0.90mm/px · z∈[-128,+26]mm · 5 of 55 slices shown]
[im 1/55]
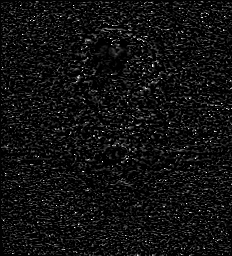
[im 14/55]
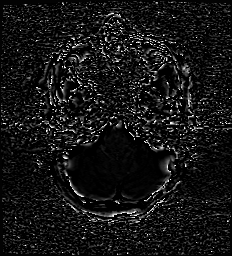
[im 28/55]
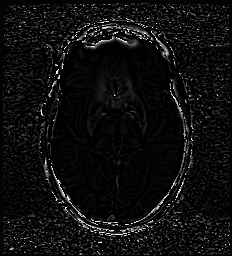
[im 41/55]
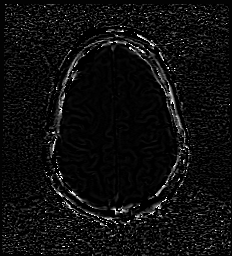
[im 55/55]
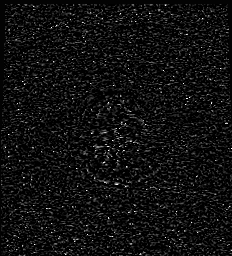

[Series 13: swi_images · axial · 3.0mm · 0.90mm/px · z∈[-128,+40]mm · 5 of 60 slices shown]
[im 1/60]
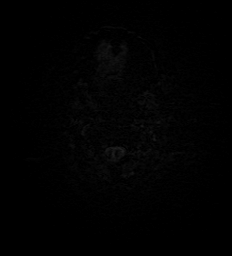
[im 15/60]
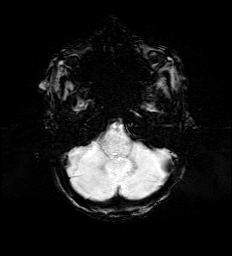
[im 30/60]
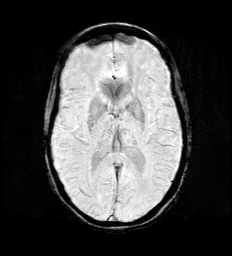
[im 45/60]
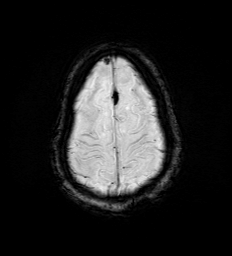
[im 60/60]
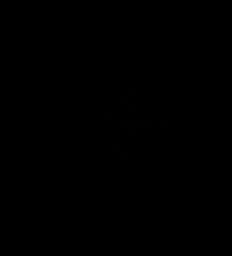

[Series 15: FLAIR · axial · 3.0mm · 0.53mm/px · z∈[-120,+34]mm · 5 of 55 slices shown]
[im 1/55]
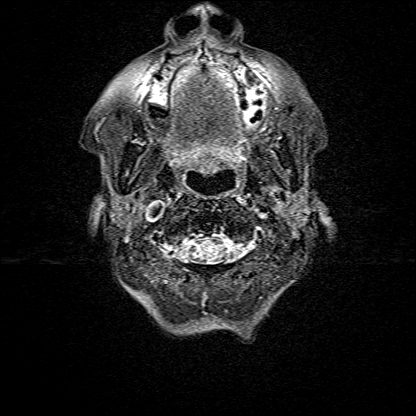
[im 14/55]
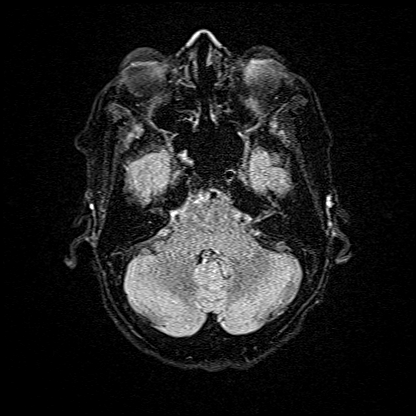
[im 28/55]
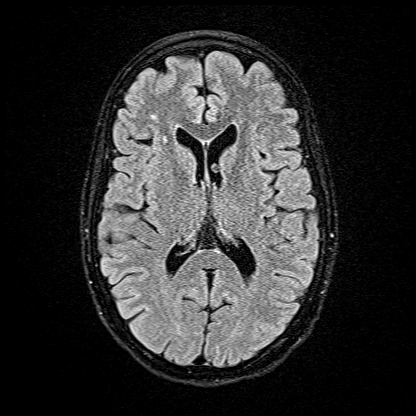
[im 41/55]
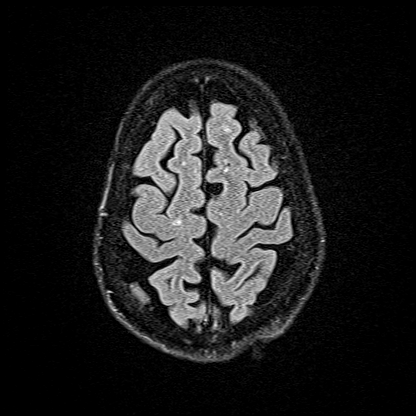
[im 55/55]
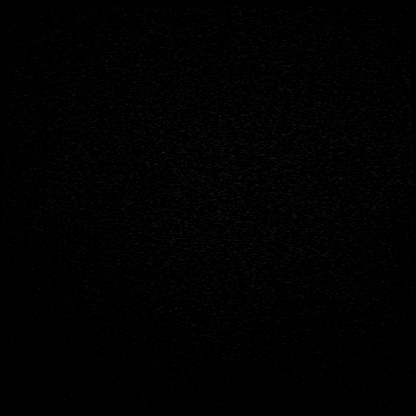

[Series 16: T1 · axial · 1.0mm · 0.98mm/px · z∈[-123,+28]mm · 12 of 160 slices shown (2 of 2)]
[im 1/160]
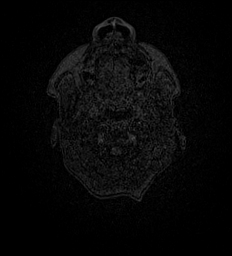
[im 13/160]
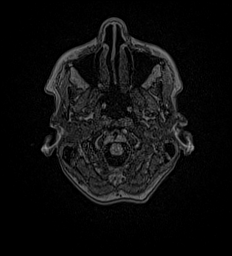
[im 25/160]
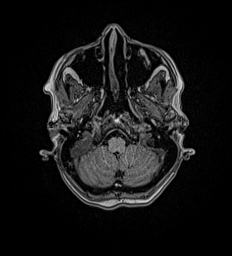
[im 37/160]
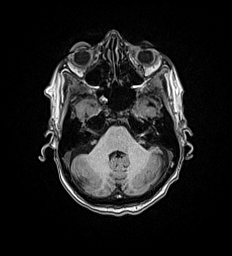
[im 49/160]
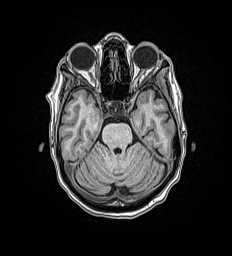
[im 62/160]
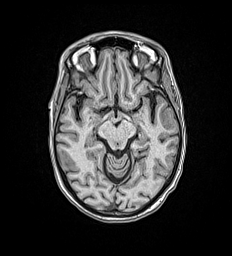
[im 74/160]
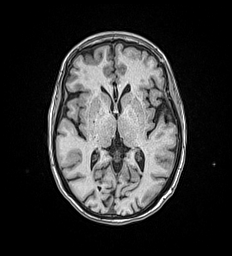
[im 86/160]
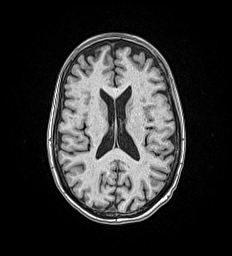
[im 98/160]
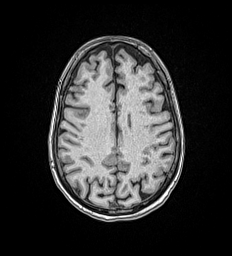
[im 111/160]
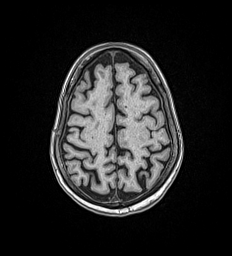
[im 135/160]
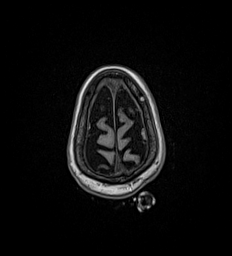
[im 160/160]
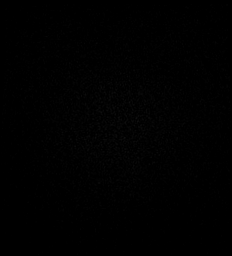

[Series 17: T2 · coronal · 5.0mm · 0.57mm/px · 2 of 27 slices shown (2 of 2)]
[im 1/27]
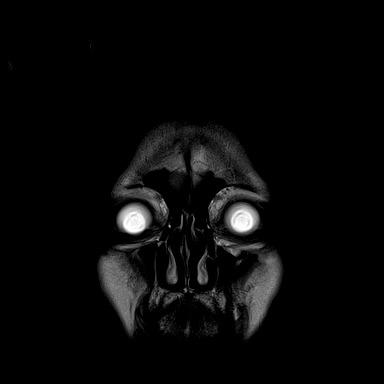
[im 27/27]
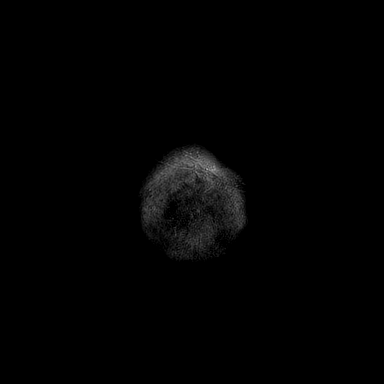

[46 of 48 positions shown; findings below may reference images not displayed]

FINDINGS: Brain:

Mild generalized cerebral atrophy.

Mild multifocal T2/FLAIR hyperintensity within the cerebral white
matter, nonspecific but compatible with chronic small vessel
ischemic disease.

There is no acute infarct.

No evidence of an intracranial mass.

No chronic intracranial blood products.

No extra-axial fluid collection.

No midline shift.

Vascular: Maintained flow voids within the proximal large arterial
vessels.

Skull and upper cervical spine: Focal suspicious marrow lesion.

Sinuses/Orbits: Visualized orbits show no acute finding. No
significant paranasal sinus disease.
IMPRESSION: No evidence of acute intracranial abnormality.

Stable noncontrast MRI appearance of the brain as compared to
02/18/2021.

Mild chronic small-vessel ischemic changes within the cerebral white
matter.

Mild generalized cerebral atrophy.

## 2023-09-10 IMAGING — CT CT HEAD CODE STROKE
3 series · 15 of 46 positions shown, 18 images · non-contrast
Comparison: 08/30/2021 CT.  02/18/2021 MRI.

CLINICAL DATA: Code stroke. Neuro deficit, acute, stroke suspected.
Aphasia. Unresponsive.

EXAM:
CT HEAD WITHOUT CONTRAST
TECHNIQUE: Contiguous axial images were obtained from the base of the skull
through the vertex without intravenous contrast.

[Series 2: head wo · axial · 0.42mm/px · z∈[+1027,+1147]mm · 9 of 29 slices shown, 12 images]
[im 3/29  brain]
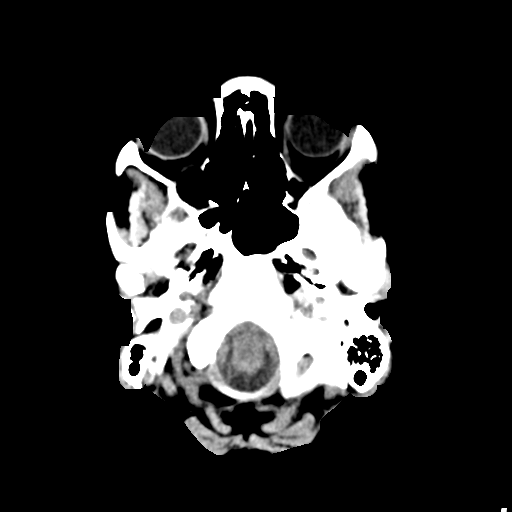
[im 3/29  bone]
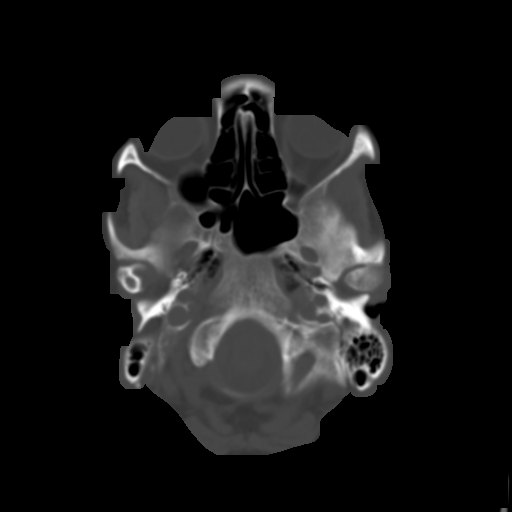
[im 6/29  brain]
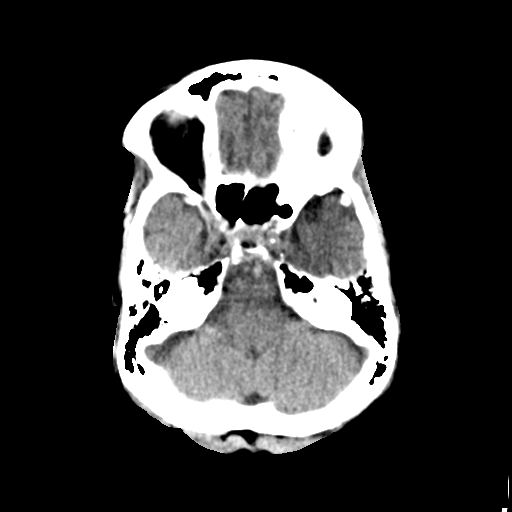
[im 9/29  brain]
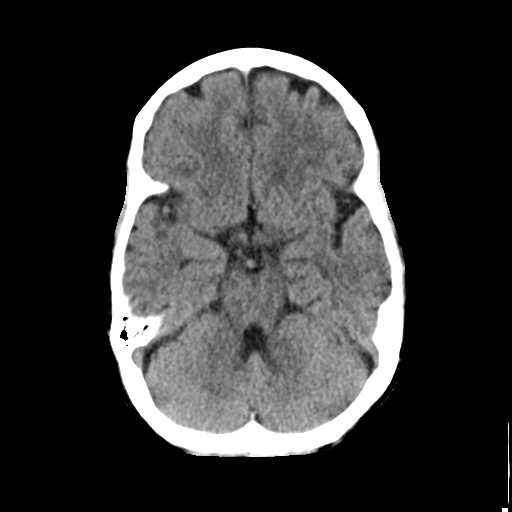
[im 12/29  brain]
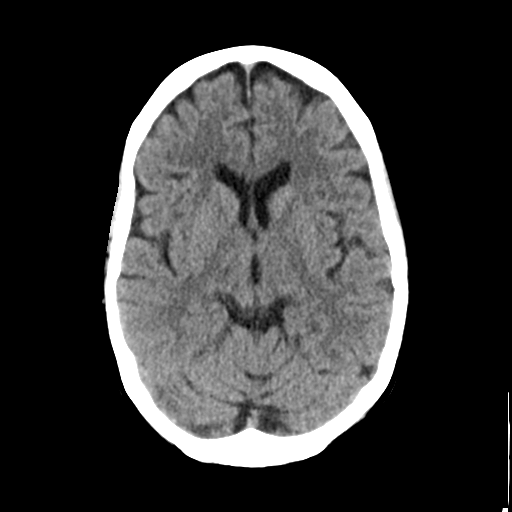
[im 15/29  brain]
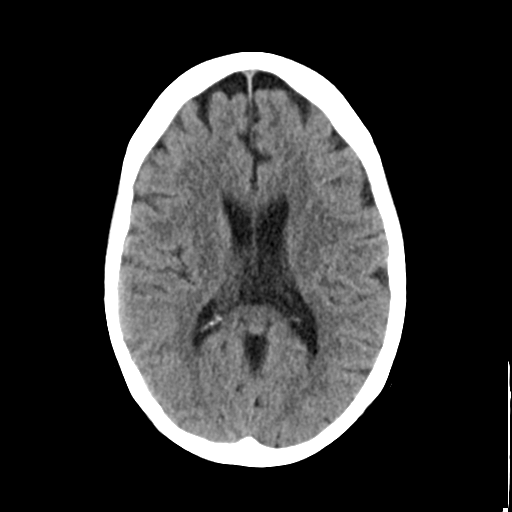
[im 15/29  bone]
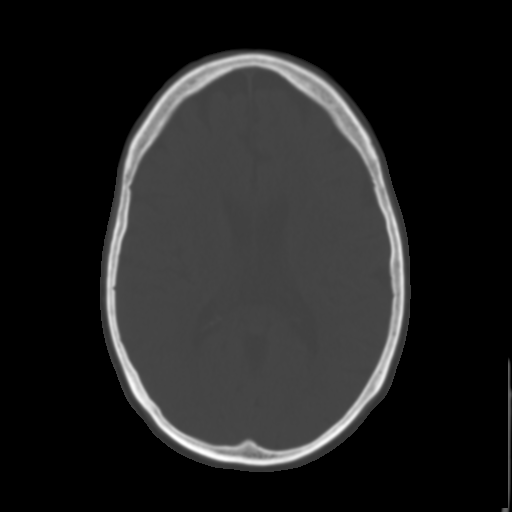
[im 18/29  brain]
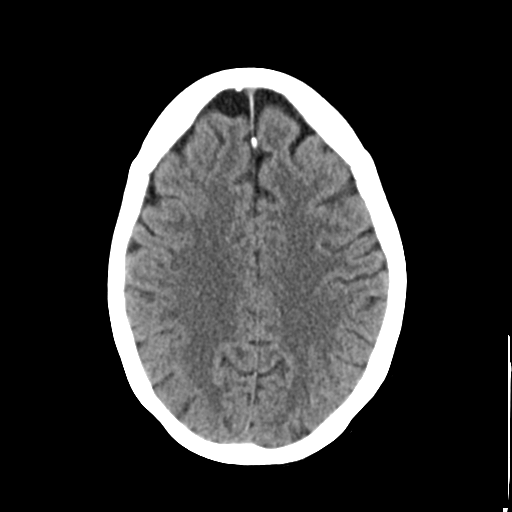
[im 21/29  brain]
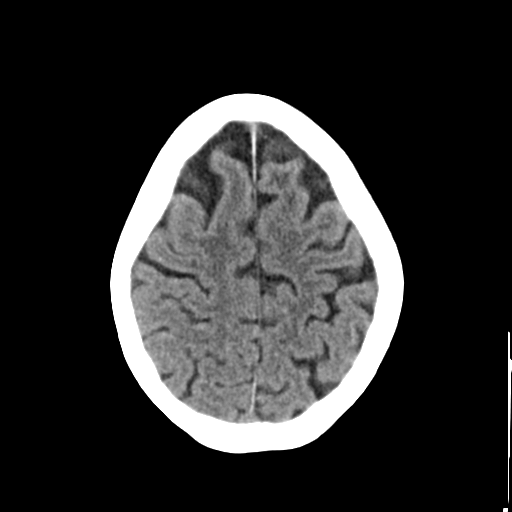
[im 24/29  brain]
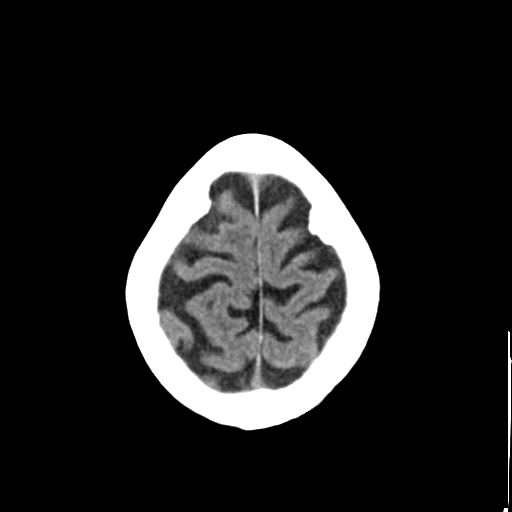
[im 27/29  brain]
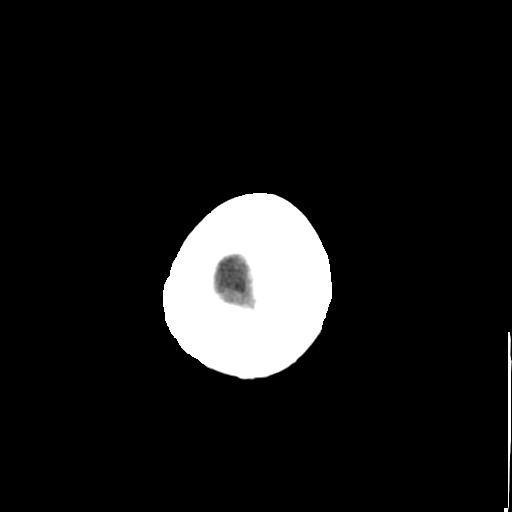
[im 27/29  bone]
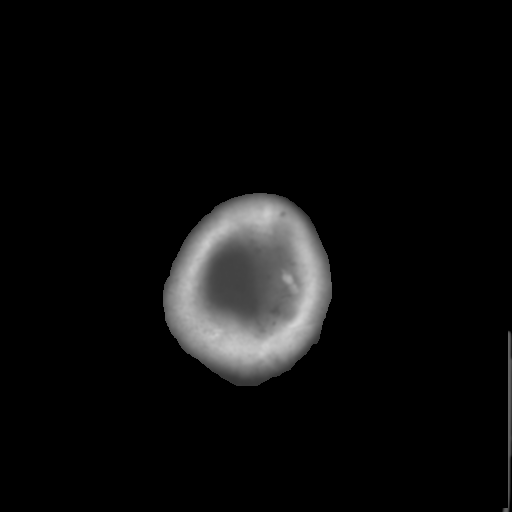

[Series 4: coronal soft tissue · coronal · 0.30mm/px · 3 of 61 slices shown]
[im 21/61  brain]
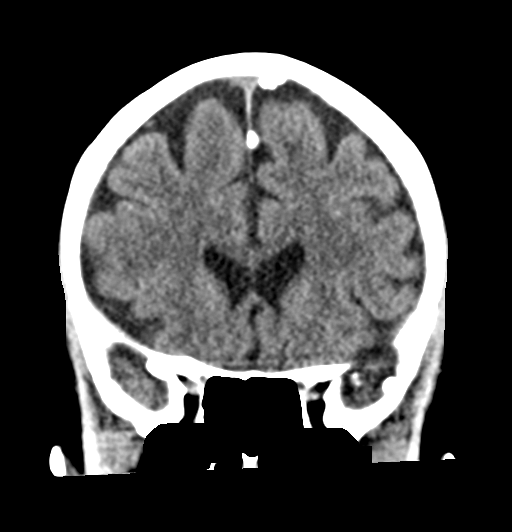
[im 27/61  brain]
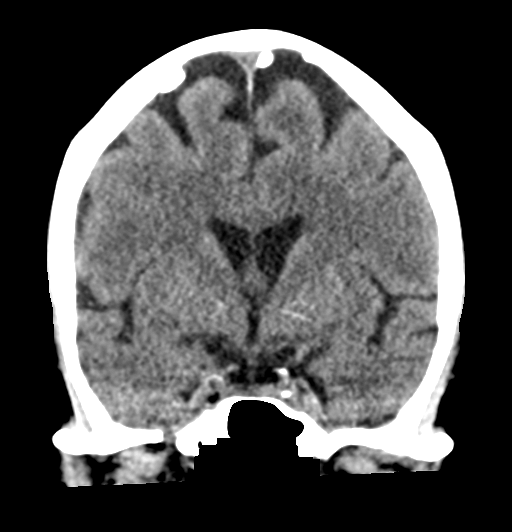
[im 34/61  brain]
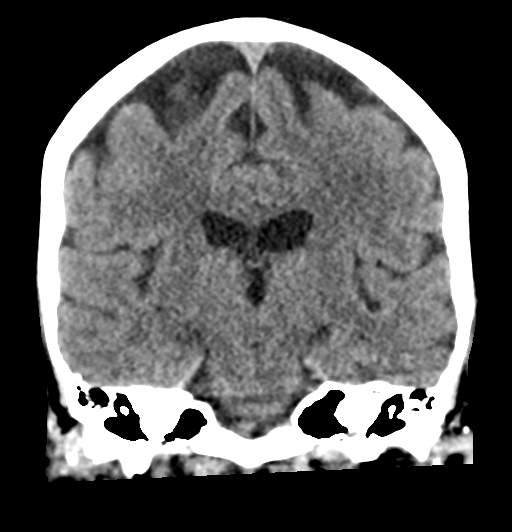

[Series 5: sagittal soft tissue · sagittal · 0.32mm/px · 3 of 43 slices shown]
[im 15/43  brain]
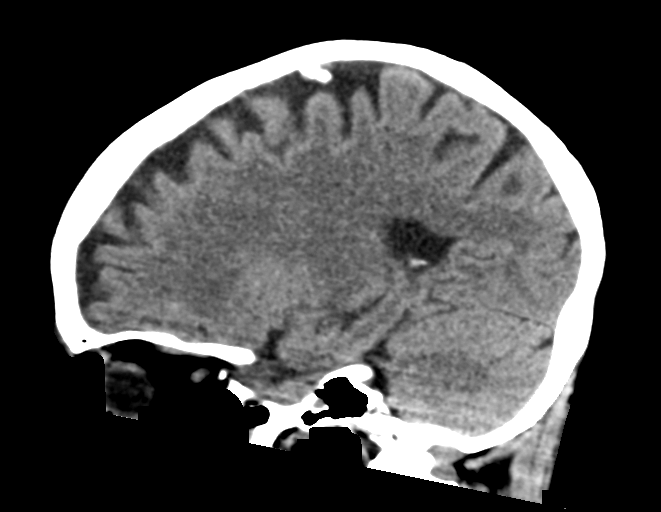
[im 22/43  brain]
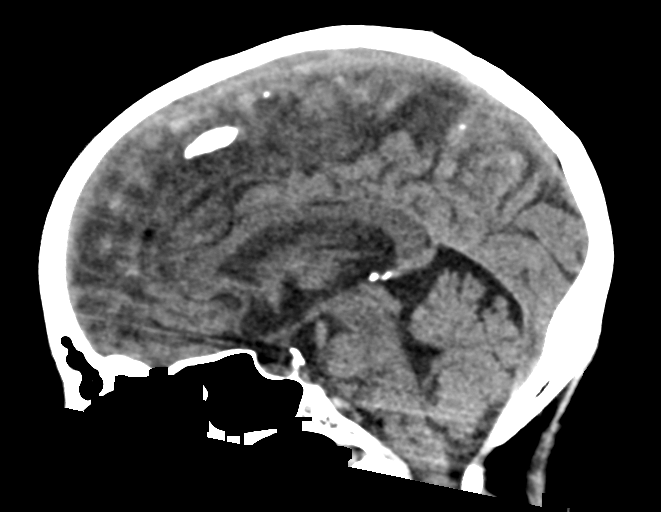
[im 29/43  brain]
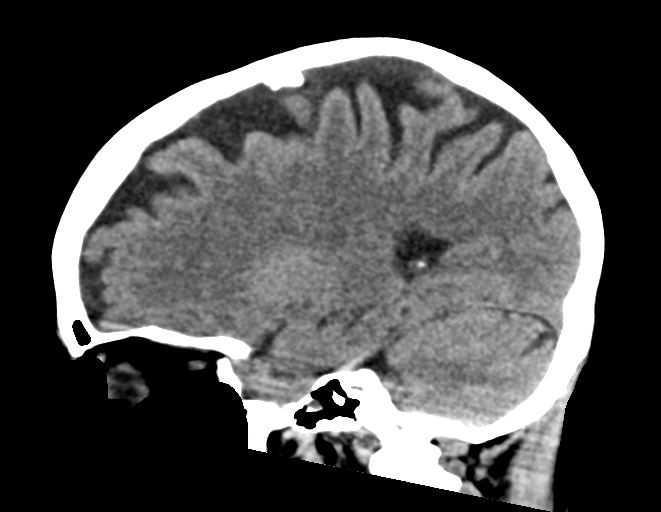

[15 of 46 positions shown; findings below may reference images not displayed]

FINDINGS: Brain: No focal abnormality seen affecting the brainstem or
cerebellum. Cerebral hemispheres show minimal small vessel change of
the white matter. No sign of acute infarction, mass lesion,
hemorrhage, hydrocephalus or extra-axial collection.

Vascular: There is atherosclerotic calcification of the major
vessels at the base of the brain.

Skull: Negative

Sinuses/Orbits: Clear/normal

Other: None

ASPECTS (Alberta Stroke Program Early CT Score)

- Ganglionic level infarction (caudate, lentiform nuclei, internal
capsule, insula, M1-M3 cortex): 7

- Supraganglionic infarction (M4-M6 cortex): 3

Total score (0-10 with 10 being normal): 10
IMPRESSION: 1. No acute brain finding. Minimal small-vessel change of the
cerebral hemispheric white matter.
2. ASPECTS is 10.
3. These results were called by telephone at the time of
interpretation on 09/05/2021 at [DATE] to provider AH JAWID HANANZAI ,
who verbally acknowledged these results.

## 2023-09-10 IMAGING — DX DG CHEST 1V PORT
1 series · 1 of 1 positions shown · non-contrast
Comparison: 03/31/2020

CLINICAL DATA: Stroke code, low blood sugar

EXAM:
PORTABLE CHEST 1 VIEW

[chest ap]
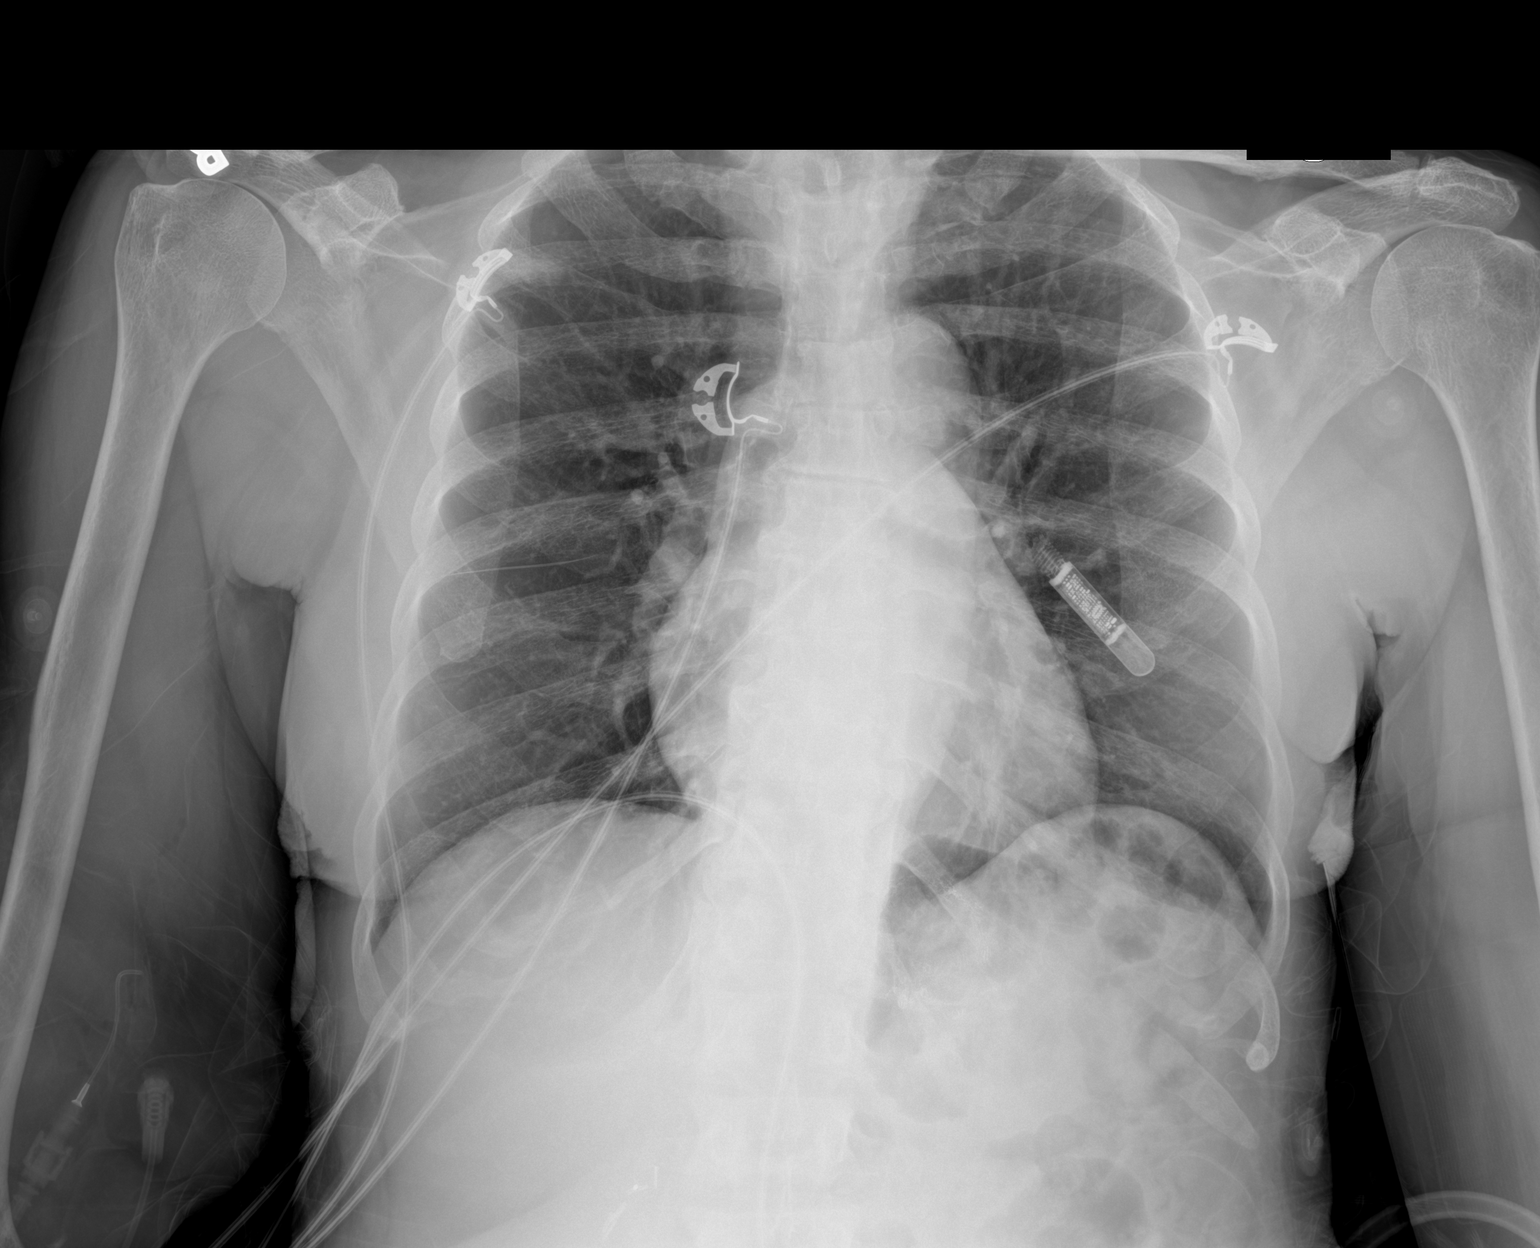

[1 of 1 positions shown; findings below may reference images not displayed]

FINDINGS: Loop recorder overlying the left hemithorax. The heart size and
mediastinal contours are within normal limits. No focal pulmonary
opacity. No pleural effusion or pneumothorax. The visualized
skeletal structures are unremarkable.
IMPRESSION: No active disease.

## 2023-09-10 MED ORDER — GLYCOPYRROLATE 0.2 MG/ML IJ SOLN: 0.2000 mg | Freq: Once | INTRAMUSCULAR | Status: DC

## 2023-09-10 MED ORDER — LIDOCAINE HCL 2 % IJ SOLN
20.0000 mL | Freq: Once | INTRAMUSCULAR | Status: AC
  Administered 2023-09-10: 400 mg

## 2023-09-10 MED ORDER — TRIAMCINOLONE ACETONIDE 40 MG/ML IJ SUSP
80.0000 mg | Freq: Once | INTRAMUSCULAR | Status: AC
  Administered 2023-09-10: 80 mg

## 2023-09-10 MED ORDER — FENTANYL CITRATE (PF) 100 MCG/2ML IJ SOLN
INTRAMUSCULAR | Status: AC
  Filled 2023-09-10: qty 2

## 2023-09-10 MED ORDER — PENTAFLUOROPROP-TETRAFLUOROETH EX AERO
INHALATION_SPRAY | Freq: Once | CUTANEOUS | Status: AC
  Administered 2023-09-10: 30 via TOPICAL
  Filled 2023-09-10: qty 30

## 2023-09-10 MED ORDER — TRIAMCINOLONE ACETONIDE 40 MG/ML IJ SUSP
INTRAMUSCULAR | Status: AC
  Filled 2023-09-10: qty 2

## 2023-09-10 MED ORDER — LIDOCAINE HCL 2 % IJ SOLN
INTRAMUSCULAR | Status: AC
  Filled 2023-09-10: qty 20

## 2023-09-10 MED ORDER — MIDAZOLAM HCL 5 MG/5ML IJ SOLN
INTRAMUSCULAR | Status: AC
  Filled 2023-09-10: qty 5

## 2023-09-10 MED ORDER — ROPIVACAINE HCL 2 MG/ML IJ SOLN
INTRAMUSCULAR | Status: AC
  Filled 2023-09-10: qty 20

## 2023-09-10 MED ORDER — FENTANYL CITRATE (PF) 100 MCG/2ML IJ SOLN: 25.0000 ug | INTRAMUSCULAR | Status: DC | PRN

## 2023-09-10 MED ORDER — LACTATED RINGERS IV SOLN: Freq: Once | INTRAVENOUS | Status: AC

## 2023-09-10 MED ORDER — MIDAZOLAM HCL 5 MG/5ML IJ SOLN
0.5000 mg | Freq: Once | INTRAMUSCULAR | Status: AC
  Administered 2023-09-10 (×2): 1 mg via INTRAVENOUS

## 2023-09-10 MED ORDER — DEXTROSE 5 % IV SOLN: INTRAVENOUS | Status: DC

## 2023-09-10 MED ORDER — ROPIVACAINE HCL 2 MG/ML IJ SOLN
18.0000 mL | Freq: Once | INTRAMUSCULAR | Status: AC
  Administered 2023-09-10: 18 mL via PERINEURAL

## 2023-09-10 NOTE — Progress Notes (Signed)
Safety precautions to be maintained throughout the outpatient stay will include: orient to surroundings, keep bed in low position, maintain call bell within reach at all times, provide assistance with transfer out of bed and ambulation.   BS 61 po fluid given per , IV sarted by Kt and D5q given BS 72. Blood sugar 85.  D52 infusing per Dr Laban Emperor order.  Patinet asymptomatic.    1239 Patient sitting in wheelchair in Recovery room awaiting procedur3e.  Alert, oriented.  IV D5W at [facility]  infusing. Denies signs or symptoms of hypoglycemia.

## 2023-09-10 NOTE — Progress Notes (Signed)
PROVIDER NOTE: Interpretation of information contained herein should be left to medically-trained personnel. Specific patient instructions are provided elsewhere under "Patient Instructions" section of medical record. This document was created in part using STT-dictation technology, any transcriptional errors that may result from this process are unintentional.  Patient: Julie Acevedo Type: Established DOB: 1951/02/16 MRN: [id number] PCP: [name], PA-C  Service: Procedure DOS: 09/10/2023 Setting: Ambulatory Location: Ambulatory outpatient facility Delivery: Face-to-face Provider: Oswaldo Done, MD Specialty: Interventional Pain Management Specialty designation: 09 Location: Outpatient facility Ref. Prov.: Delano Metz, MD       Interventional Therapy   Procedure: Lumbar Facet, Medial Branch Block(s) #1 (Bilateral L4, L5 Pedicle screw injection.) Laterality: Bilateral  Level: L2, L3, L4, L5, and S1 Medial Branch Level(s). Injecting these levels blocks the L3-4, L4-5, and L5-S1 lumbar facet joints. Imaging: Fluoroscopic guidance Spinal ([id number]) Anesthesia: Local anesthesia (1-2% Lidocaine) Anxiolysis: IV Versed 2.0 mg + Glycopyrrolate 0.2mg   Sedation: Moderate Sedation None required. No Fentanyl administered.         DOS: 09/10/2023 Performed by: Oswaldo Done, MD  Primary Purpose: Diagnostic/Therapeutic Indications: Low back pain severe enough to impact quality of life or function. 1. 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 Chronic low back pain (1ry area of Pain) (Bilateral) (R>L) w/o sciatica   3. Chronic low back pain of over 3 months duration   4. Lumbar facet arthropathy   5. Lumbar facet joint pain   6. Lumbar facet joint syndrome   7. Spondylosis without myelopathy or radiculopathy, lumbosacral region   8. Lumbar facet hypertrophy (Multilevel) (Bilateral)   9. Failed back surgical syndrome   10. Low back pain radiating to lower  extremity (Right)   11. History of allergy to radiographic contrast media    NAS-11 Pain score:   Pre-procedure: 8 /10   Post-procedure: 0-No pain/10   Note: Referred feeling bad upon arrived POC dextro was 60 (hypoglycemic). D5W given and improved.     Position / Prep / Materials:  Position: Prone  Prep solution: DuraPrep (Iodine Povacrylex [0.7% available iodine] and Isopropyl Alcohol, 74% w/w) Area Prepped: Posterolateral Lumbosacral Spine (Wide prep: From the lower border of the scapula down to the end of the tailbone and from flank to flank.)  Materials:  Tray: Block Needle(s):  Type: Spinal  Gauge (G): 22  Length: 3.5-in Qty: 4      H&P (Pre-op Assessment):  Julie Acevedo is a 72 y.o. (year old), female patient, seen today for interventional treatment. She  has a past surgical history that includes Hemicolectomy; Lumbar disc surgery; Roux-en-Y Gastric Bypass (2010); Cholecystectomy; Eye surgery; Hand surgery; Foot surgery; Abdominal hysterectomy; Tonsillectomy; Upper gi endoscopy (01/12/2016); Cardiac catheterization (2000); Laparoscopic lysis of adhesions (N/A, 08/21/2016); Ventral hernia repair (N/A, 08/21/2016); Insertion of mesh (N/A, 08/21/2016); Cystocele repair (N/A, 03/19/2017); Cystoscopy (N/A, 03/19/2017); Laparoscopic removal abdominal mass; LOOP RECORDER INSERTION (N/A, 03/21/2018); laparotomy (N/A, 04/16/2019); XI robotic assisted ventral hernia (N/A, 12/08/2019); Insertion of mesh (N/A, 12/08/2019); Breast excisional biopsy (Right, 1971); Small intestine surgery; and Esophagogastroduodenoscopy (egd) with propofol (N/A, 12/15/2021). Julie Acevedo has a current medication list which includes the following prescription(s): accu-chek guide, accu-chek softclix lancets, atorvastatin, blood glucose meter kit and supplies, calcitriol, diazoxide, gabapentin, hydroxyzine, levetiracetam, linzess, metoprolol succinate, mirtazapine, mirtazapine, rivastigmine, rosuvastatin, trazodone, and  triamcinolone cream, and the following Facility-Administered Medications: dextrose, fentanyl, and glycopyrrolate. Her primarily concern today is the Back Pain (lower)  Initial Vital Signs:  Pulse/HCG Rate: (!) 52ECG Heart Rate: 79 Temp: Marland Kitchen)  97.3 F (36.3 C) Resp: 16 BP: (!) 166/75 SpO2: 100 %  BMI: Estimated body mass index is 24.62 kg/m as calculated from the following:   Height as of this encounter: 5\' 3"  (1.6 m).   Weight as of this encounter: 139 lb (63 kg).  Risk Assessment: Allergies: Reviewed. She is allergic to lotrel [amlodipine besy-benazepril hcl], contrast media [iodinated contrast media], niacin, and sulfa antibiotics.  Allergy Precautions: None required Coagulopathies: Reviewed. None identified.  Blood-thinner therapy: None at this time Active Infection(s): Reviewed. None identified. Julie Acevedo is afebrile  Site Confirmation: Julie Acevedo was asked to confirm the procedure and laterality before marking the site Procedure checklist: Completed Consent: Before the procedure and under the influence of no sedative(s), amnesic(s), or anxiolytics, the patient was informed of the treatment options, risks and possible complications. To fulfill our ethical and legal obligations, as recommended by the American Medical Association's Code of Ethics, I have informed the patient of my clinical impression; the nature and purpose of the treatment or procedure; the risks, benefits, and possible complications of the intervention; the alternatives, including doing nothing; the risk(s) and benefit(s) of the alternative treatment(s) or procedure(s); and the risk(s) and benefit(s) of doing nothing. The patient was provided information about the general risks and possible complications associated with the procedure. These may include, but are not limited to: failure to achieve desired goals, infection, bleeding, organ or nerve damage, allergic reactions, paralysis, and death. In addition, the patient  was informed of those risks and complications associated to Spine-related procedures, such as failure to decrease pain; infection (i.e.: Meningitis, epidural or intraspinal abscess); bleeding (i.e.: epidural hematoma, subarachnoid hemorrhage, or any other type of intraspinal or peri-dural bleeding); organ or nerve damage (i.e.: Any type of peripheral nerve, nerve root, or spinal cord injury) with subsequent damage to sensory, motor, and/or autonomic systems, resulting in permanent pain, numbness, and/or weakness of one or several areas of the body; allergic reactions; (i.e.: anaphylactic reaction); and/or death. Furthermore, the patient was informed of those risks and complications associated with the medications. These include, but are not limited to: allergic reactions (i.e.: anaphylactic or anaphylactoid reaction(s)); adrenal axis suppression; blood sugar elevation that in diabetics may result in ketoacidosis or comma; water retention that in patients with history of congestive heart failure may result in shortness of breath, pulmonary edema, and decompensation with resultant heart failure; weight gain; swelling or edema; medication-induced neural toxicity; particulate matter embolism and blood vessel occlusion with resultant organ, and/or nervous system infarction; and/or aseptic necrosis of one or more joints. Finally, the patient was informed that Medicine is not an exact science; therefore, there is also the possibility of unforeseen or unpredictable risks and/or possible complications that may result in a catastrophic outcome. The patient indicated having understood very clearly. We have given the patient no guarantees and we have made no promises. Enough time was given to the patient to ask questions, all of which were answered to the patient's satisfaction. Ms. Hake has indicated that she wanted to continue with the procedure. Attestation: I, the ordering provider, attest that I have discussed with  the patient the benefits, risks, side-effects, alternatives, likelihood of achieving goals, and potential problems during recovery for the procedure that I have provided informed consent. Date  Time: 09/10/2023 10:21 AM   Pre-Procedure Preparation:  Monitoring: As per clinic protocol. Respiration, ETCO2, SpO2, BP, heart rate and rhythm monitor placed and checked for adequate function Safety Precautions: Patient was assessed for positional comfort and pressure points  before starting the procedure. Time-out: I initiated and conducted the "Time-out" before starting the procedure, as per protocol. The patient was asked to participate by confirming the accuracy of the "Time Out" information. Verification of the correct person, site, and procedure were performed and confirmed by me, the nursing staff, and the patient. "Time-out" conducted as per Joint Commission's Universal Protocol (UP.01.01.01). Time: 1257 Start Time: 1257 hrs.  Description of Procedure:          Laterality: (see above) Targeted Levels: (see above)  Safety Precautions: Aspiration looking for blood return was conducted prior to all injections. At no point did we inject any substances, as a needle was being advanced. Before injecting, the patient was told to immediately notify me if she was experiencing any new onset of "ringing in the ears, or metallic taste in the mouth". No attempts were made at seeking any paresthesias. Safe injection practices and needle disposal techniques used. Medications properly checked for expiration dates. SDV (single dose vial) medications used. After the completion of the procedure, all disposable equipment used was discarded in the proper designated medical waste containers. Local Anesthesia: Protocol guidelines were followed. The patient was positioned over the fluoroscopy table. The area was prepped in the usual manner. The time-out was completed. The target area was identified using fluoroscopy. A 12-in  long, straight, sterile hemostat was used with fluoroscopic guidance to locate the targets for each level blocked. Once located, the skin was marked with an approved surgical skin marker. Once all sites were marked, the skin (epidermis, dermis, and hypodermis), as well as deeper tissues (fat, connective tissue and muscle) were infiltrated with a small amount of a short-acting local anesthetic, loaded on a 10cc syringe with a 25G, 1.5-in  Needle. An appropriate amount of time was allowed for local anesthetics to take effect before proceeding to the next step. Local Anesthetic: Lidocaine 2.0% The unused portion of the local anesthetic was discarded in the proper designated containers. Technical description of process:  L2 Medial Branch Nerve Block (MBB): The target area for the L2 medial branch is at the junction of the postero-lateral aspect of the superior articular process and the superior, posterior, and medial edge of the transverse process of L3. Under fluoroscopic guidance, a Quincke needle was inserted until contact was made with os over the superior postero-lateral aspect of the pedicular shadow (target area). After negative aspiration for blood, 0.5 mL of the nerve block solution was injected without difficulty or complication. The needle was removed intact. L3 Medial Branch Nerve Block (MBB): The target area for the L3 medial branch is at the junction of the postero-lateral aspect of the superior articular process and the superior, posterior, and medial edge of the transverse process of L4. Under fluoroscopic guidance, a Quincke needle was inserted until contact was made with os over the superior postero-lateral aspect of the pedicular shadow (target area). After negative aspiration for blood, 0.5 mL of the nerve block solution was injected without difficulty or complication. The needle was removed intact. L4 Medial Branch Nerve Block (MBB): The target area for the L4 medial branch is at the junction  of the postero-lateral aspect of the superior articular process and the superior, posterior, and medial edge of the transverse process of L5. Under fluoroscopic guidance, a Quincke needle was inserted until contact was made with os over the superior postero-lateral aspect of the pedicular shadow (target area). After negative aspiration for blood, 0.5 mL of the nerve block solution was injected without difficulty or  complication. The needle was removed intact. L5 Medial Branch Nerve Block (MBB): The target area for the L5 medial branch is at the junction of the postero-lateral aspect of the superior articular process and the superior, posterior, and medial edge of the sacral ala. Under fluoroscopic guidance, a Quincke needle was inserted until contact was made with os over the superior postero-lateral aspect of the pedicular shadow (target area). After negative aspiration for blood, 0.5 mL of the nerve block solution was injected without difficulty or complication. The needle was removed intact. S1 Medial Branch Nerve Block (MBB): The target area for the S1 medial branch is at the posterior and inferior 6 o'clock position of the L5-S1 facet joint. Under fluoroscopic guidance, the Quincke needle inserted for the L5 MBB was redirected until contact was made with os over the inferior and postero aspect of the sacrum, at the 6 o' clock position under the L5-S1 facet joint (Target area). After negative aspiration for blood, 0.5 mL of the nerve block solution was injected without difficulty or complication. The needle was removed intact.  Once the entire procedure was completed, the treated area was cleaned, making sure to leave some of the prepping solution back to take advantage of its long term bactericidal properties.         Illustration of the posterior view of the lumbar spine and the posterior neural structures. Laminae of L2 through S1 are labeled. DPRL5, dorsal primary ramus of L5; DPRS1, dorsal primary  ramus of S1; DPR3, dorsal primary ramus of L3; FJ, facet (zygapophyseal) joint L3-L4; I, inferior articular process of L4; LB1, lateral branch of dorsal primary ramus of L1; IAB, inferior articular branches from L3 medial branch (supplies L4-L5 facet joint); IBP, intermediate branch plexus; MB3, medial branch of dorsal primary ramus of L3; NR3, third lumbar nerve root; S, superior articular process of L5; SAB, superior articular branches from L4 (supplies L4-5 facet joint also); TP3, transverse process of L3.   Facet Joint Innervation (* possible contribution)  L1-2 T12, L1 (L2*)  Medial Branch  L2-3 L1, L2 (L3*)         "          "  L3-4 L2, L3 (L4*)         "          "  L4-5 L3, L4 (L5*)         "          "  L5-S1 L4, L5, S1          "          "    Vitals:   09/10/23 1307 09/10/23 1315 09/10/23 1325 09/10/23 1335  BP: (!) 154/98 (!) 181/90 (!) 186/94 (!) 148/88  Pulse: 73     Resp: 18 16 16 18   Temp:      SpO2: 100%  99% 100%  Weight:      Height:         End Time: 1307 hrs.  Imaging Guidance (Spinal):          Type of Imaging Technique: Fluoroscopy Guidance (Spinal) Indication(s): Assistance in needle guidance and placement for procedures requiring needle placement in or near specific anatomical locations not easily accessible without such assistance. Exposure Time: Please see nurses notes. Contrast: None used. Fluoroscopic Guidance: I was personally present during the use of fluoroscopy. "Tunnel Vision Technique" used to obtain the best possible view of the target area. Parallax error corrected before commencing the  procedure. "Direction-depth-direction" technique used to introduce the needle under continuous pulsed fluoroscopy. Once target was reached, antero-posterior, oblique, and lateral fluoroscopic projection used confirm needle placement in all planes. Images permanently stored in EMR. Interpretation: No contrast injected. I personally interpreted the imaging  intraoperatively. Adequate needle placement confirmed in multiple planes. Permanent images saved into the patient's record.  Post-operative Assessment:  Post-procedure Vital Signs:  Pulse/HCG Rate: 7392 Temp: (!) 97.3 F (36.3 C) Resp: 18 BP: (!) 148/88 SpO2: 100 %  EBL: None  Complications: No immediate post-treatment complications observed by team, or reported by patient.  Note: The patient tolerated the entire procedure well. A repeat set of vitals were taken after the procedure and the patient was kept under observation following institutional policy, for this type of procedure. Post-procedural neurological assessment was performed, showing return to baseline, prior to discharge. The patient was provided with post-procedure discharge instructions, including a section on how to identify potential problems. Should any problems arise concerning this procedure, the patient was given instructions to immediately contact us, at any time, without hesitation. In any case, we plan to contact the patient by telephone for a follow-up status report regarding this interventional procedure.  Comments:  No additional relevant information.  Plan of Care (POC)  Orders:  Orders Placed This Encounter  Procedures   LUMBAR FACET(MEDIAL BRANCH NERVE BLOCK) MBNB    Scheduling Instructions:     Procedure: Lumbar facet block (AKA.: Lumbosacral medial branch nerve block)     Side: Bilateral     Level: L3-4, L4-5, L5-S1, and TBD Facets (L2, L3, L4, L5, S1, and TBD Medial Branch Nerves)     Sedation: Patient's choice.     Timeframe: Today    Order Specific Question:   Where will this procedure be performed?    Answer:   [facility]   MR LUMBAR SPINE WO CONTRAST    Patient presents with axial pain with possible radicular component. Please assist [place] in identifying specific level(s) and laterality of any additional findings such as: 1. Facet (Zygapophyseal) joint DJD (Hypertrophy, space narrowing,  subchondral sclerosis, and/or osteophyte formation) 2. DDD and/or IVDD (Loss of disc height, desiccation, gas patterns, osteophytes, endplate sclerosis, or "Black disc disease") 3. Pars defects 4. Spondylolisthesis, spondylosis, and/or spondyloarthropathies (include Degree/Grade of displacement in mm) (stability) 5. Vertebral body Fractures (acute/chronic) (state percentage of collapse) 6. Demineralization (osteopenia/osteoporotic) 7. Bone pathology 8. Foraminal narrowing  9. Surgical changes 10. Central, Lateral Recess, and/or Foraminal Stenosis (include AP diameter of stenosis in mm) 11. Surgical changes (hardware type, status, and presence of fibrosis) 12. Modic Type Changes (MRI only) 13. IVDD (Disc bulge, protrusion, herniation, extrusion) (Level, laterality, extent)    Standing Status:   Future    Standing Expiration Date:   12/10/2023    Scheduling Instructions:     Please make sure that the patient understands that this needs to be done as soon as possible. Never have the patient do the imaging "just before the next appointment". Inform patient that having the imaging done within the [facility] will expedite the availability of the results and will provide      imaging availability to the requesting physician. In addition inform the patient that the imaging order has an expiration date and will not be renewed if not done within the active period.    Order Specific Question:   What is the patient's sedation requirement?    Answer:   No Sedation    Order Specific Question:  Does the patient have a pacemaker or implanted devices?    Answer:   No    Order Specific Question:   Preferred imaging location?    Answer:   [facility] (table limit-350lbs)    Order Specific Question:   Call Results- Best Contact Number?    Answer:   [phone] [facility]    Order Specific Question:   Radiology Contrast Protocol - do NOT remove  file path    Answer:   [email or web address]   DG PAIN CLINIC C-ARM 1-60 MIN NO REPORT    Intraoperative interpretation by procedural physician at [facility].    Standing Status:   Standing    Number of Occurrences:   1    Order Specific Question:   Reason for exam:    Answer:   Assistance in needle guidance and placement for procedures requiring needle placement in or near specific anatomical locations not easily accessible without such assistance.   Glucose, capillary   Glucose, capillary   Glucose, capillary   Informed Consent Details: Physician/Practitioner Attestation; Transcribe to consent form and obtain patient signature    Nursing Order: Transcribe to consent form and obtain patient signature. Note: Always confirm laterality of pain with Ms. Azucena Cecil, before procedure.    Order Specific Question:   Physician/Practitioner attestation of informed consent for procedure/surgical case    Answer:   I, the physician/practitioner, attest that I have discussed with the patient the benefits, risks, side effects, alternatives, likelihood of achieving goals and potential problems during recovery for the procedure that I have provided informed consent.    Order Specific Question:   Procedure    Answer:   Lumbar Facet Block  under fluoroscopic guidance    Order Specific Question:   Physician/Practitioner performing the procedure    Answer:   Dermot Gremillion A. Laban Emperor MD    Order Specific Question:   Indication/Reason    Answer:   Low Back Pain, with our without leg pain, due to Facet Joint Arthralgia (Joint Pain) Spondylosis (Arthritis of the Spine), without myelopathy or radiculopathy (Nerve Damage).   Provide equipment / supplies at bedside    Procedure tray: "Block Tray" (Disposable  single use) Skin infiltration needle: Regular 1.5-in, 25-G, (x1) Block Needle type: Spinal Amount/quantity: 4 Size: Regular (3.5-inch) Gauge: 22G    Standing Status:   Standing     Number of Occurrences:   1    Order Specific Question:   Specify    Answer:   Block Tray   POCT glucose   Miscellanous precautions    Standing Status:   Standing    Number of Occurrences:   1   Chronic Opioid Analgesic:  None MME/day: 0 mg/day   Medications ordered for procedure: Meds ordered this encounter  Medications   lidocaine (XYLOCAINE) 2 % (with pres) injection 400 mg   pentafluoroprop-tetrafluoroeth (GEBAUERS) aerosol   lactated ringers infusion   midazolam (VERSED) 5 MG/5ML injection 0.5-2 mg    Make sure Flumazenil is available in the pyxis when using this medication. If oversedation occurs, administer 0.2 mg IV over 15 sec. If after 45 sec no response, administer 0.2 mg again over 1 min; may repeat at 1 min intervals; not to exceed 4 doses (1 mg)   fentaNYL (SUBLIMAZE) injection 25-50 mcg    Make sure Narcan is available in the pyxis when using this medication. In the event of respiratory depression (RR< 8/min): Titrate NARCAN (naloxone) in increments of 0.1  to 0.2 mg IV at 2-3 minute intervals, until desired degree of reversal.   ropivacaine (PF) 2 mg/mL (0.2%) (NAROPIN) injection 18 mL   triamcinolone acetonide (KENALOG-40) injection 80 mg   dextrose 5 % solution   glycopyrrolate (ROBINUL) injection 0.2 mg   Medications administered: We administered lidocaine, pentafluoroprop-tetrafluoroeth, lactated ringers, midazolam, ropivacaine (PF) 2 mg/mL (0.2%), triamcinolone acetonide, and dextrose.  See the medical record for exact dosing, route, and time of administration.  Follow-up plan:   Return in about 2 weeks (around 09/24/2023) for (Face2F), (PPE).       Interventional Therapies  Risk Factors  Considerations:  Cardiologist: Dorothyann Peng, MD ([facility])  Neurologist: Cristopher Peru, MD [facility] neurology) Hx of MI (1995)  Hx of TIAs  T2DM  Stage 3b CKD  [name]  Memory impairment  Hx of Bowel Obstruction (04/2019)  GERD  s/p Gastric Bypass for  obesity  Malnutrition-malabsorption  Hx. Seizures      Planned  Pending:   Diagnostic bilateral lumbar facet MBB #1 (09/10/2023)    Under consideration:   Diagnostic bilateral lumbar facet MBB #1  Diagnostic/therapeutic midline to right caudal ESI #1 w/ epidurogram  Diagnostic/therapeutic right IA hip joint injection #1    Completed:   None at this time   Therapeutic  Palliative (PRN) options:   None established   Completed by other providers:   Therapeutic/diagnostic right L5 TFESI x2 (04/11/2016, 05/02/2016) by Merri Ray, DO [facility])  Therapeutic/diagnostic right S1 TFESI x2 (04/11/2016, 05/02/2016) by Merri Ray, DO [facility] [facility]  Therapeutic/diagnostic right L3 TFESI x1 (07/19/2016) by Merri Ray, DO [facility] PM[facility])         Recent Visits Date Type Provider Dept  08/21/23 Office Visit Delano Metz, MD [facility]  06/19/23 Office Visit Delano Metz, MD [facility]  Showing recent visits within past 90 days and meeting all other requirements Today's Visits Date Type Provider Dept  09/10/23 Procedure visit Delano Metz, MD [facility]  Showing today's visits and meeting all other requirements Future Appointments Date Type Provider Dept  09/26/23 Appointment Delano Metz, MD [facility]  Showing future appointments within next 90 days and meeting all other requirements  Disposition: Discharge home  Discharge (Date  Time): 09/10/2023; 1340 hrs.   Primary Care Physician: [name], PA-C Location: [facility] Facility Note by: Oswaldo Done, MD (TTS technology used. I apologize for any typographical errors that were not detected and corrected.) Date: 09/10/2023; Time: 2:27 PM  Disclaimer:  Medicine is not an Visual merchandiser. The only guarantee in medicine is that nothing is guaranteed. It is important to note that the decision to proceed with this intervention was based on  the information collected from the patient. The Data and conclusions were drawn from the patient's questionnaire, the interview, and the physical examination. Because the information was provided in large part by the patient, it cannot be guaranteed that it has not been purposely or unconsciously manipulated. Every effort has been made to obtain as much relevant data as possible for this evaluation. It is important to note that the conclusions that lead to this procedure are derived in large part from the available data. Always take into account that the treatment will also be dependent on availability of resources and existing treatment guidelines, considered by other Pain Management Practitioners as being common knowledge and practice, at the time of the intervention. For Medico-Legal purposes, it is also important to point out that variation in  procedural techniques and pharmacological choices are the acceptable norm. The indications, contraindications, technique, and results of the above procedure should only be interpreted and judged by a Board-Certified Interventional Pain Specialist with extensive familiarity and expertise in the same exact procedure and technique.

## 2023-09-10 NOTE — Patient Instructions (Signed)

## 2023-09-11 ENCOUNTER — Telehealth: Payer: Self-pay | Admitting: *Deleted

## 2023-09-11 IMAGING — CT CT HEAD W/O CM
3 series · 15 of 47 positions shown, 18 images · non-contrast
Comparison: MRI 09/05/2021.

CLINICAL DATA: Stroke.  Follow-up.

EXAM:
CT HEAD WITHOUT CONTRAST
TECHNIQUE: Contiguous axial images were obtained from the base of the skull
through the vertex without intravenous contrast.

[Series 2: head wo · axial · 0.40mm/px · z∈[-121,+4]mm · 9 of 30 slices shown, 12 images]
[im 3/30  brain]
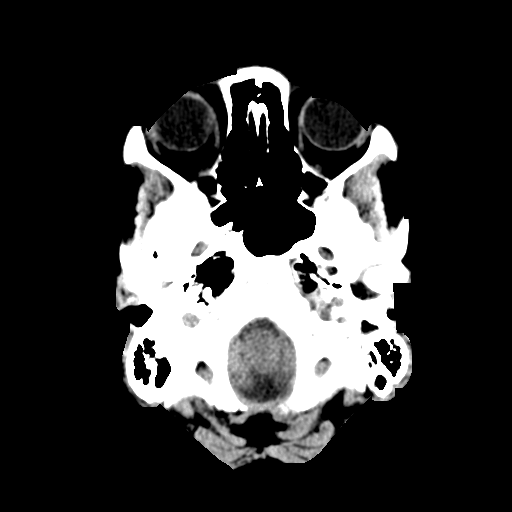
[im 3/30  bone]
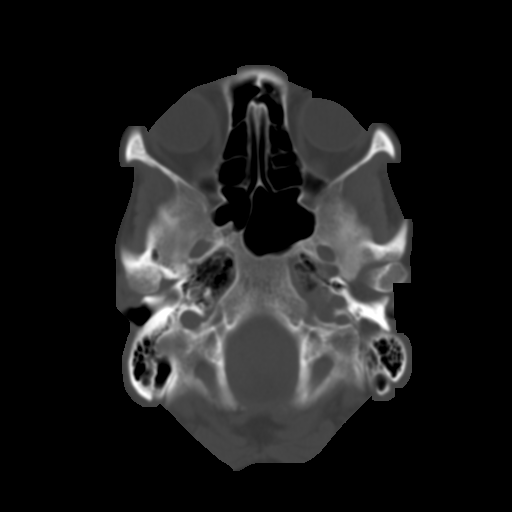
[im 6/30  brain]
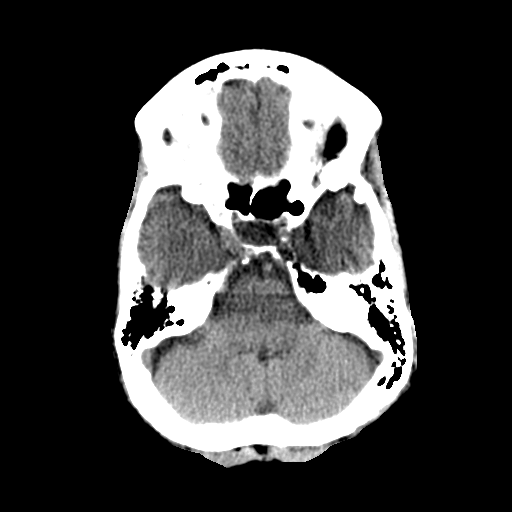
[im 9/30  brain]
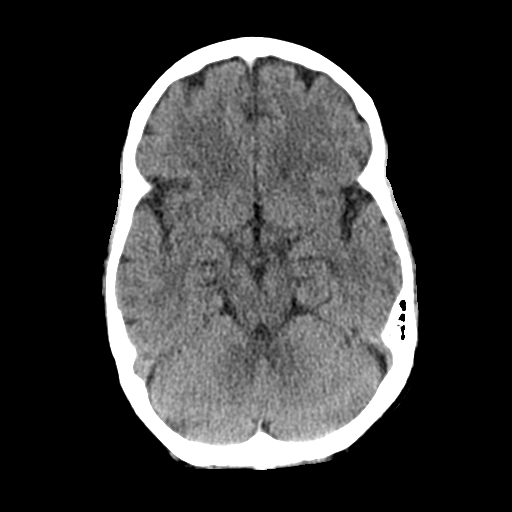
[im 12/30  brain]
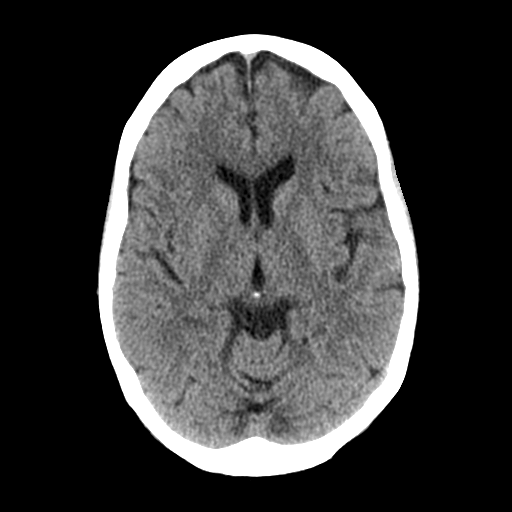
[im 16/30  brain]
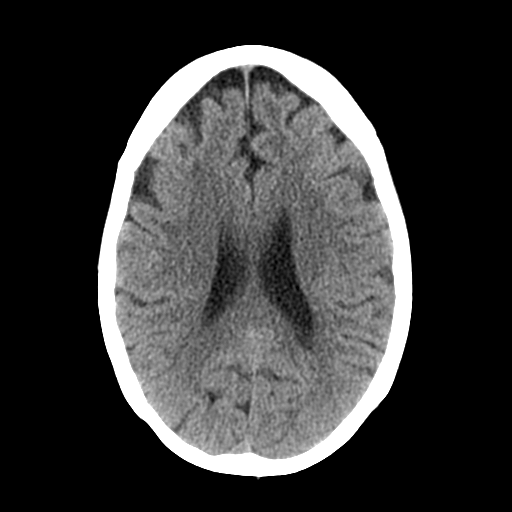
[im 16/30  bone]
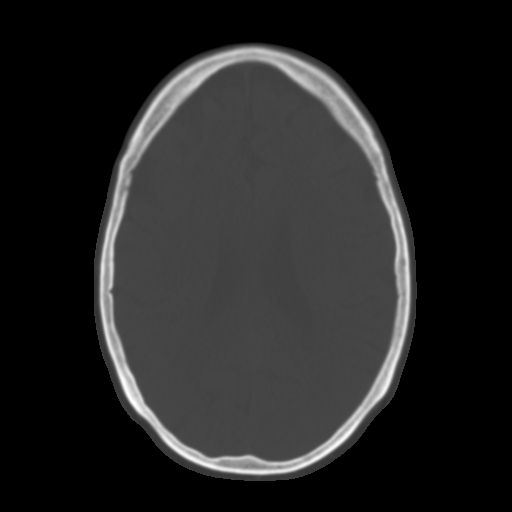
[im 19/30  brain]
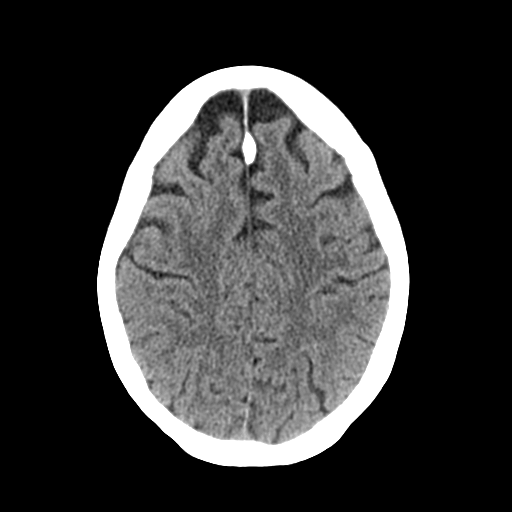
[im 22/30  brain]
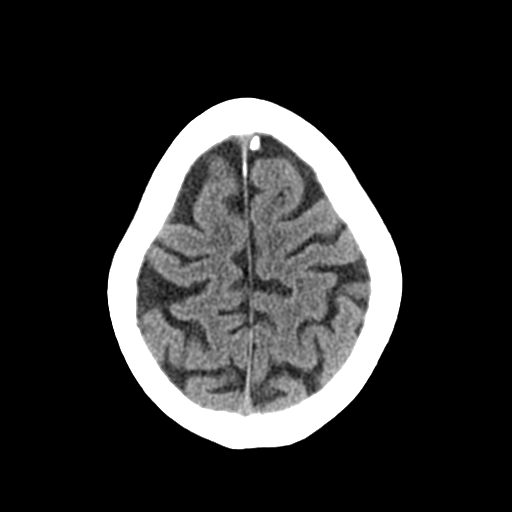
[im 25/30  brain]
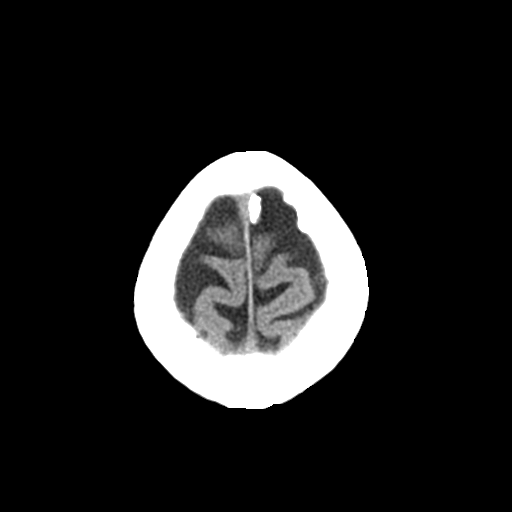
[im 28/30  brain]
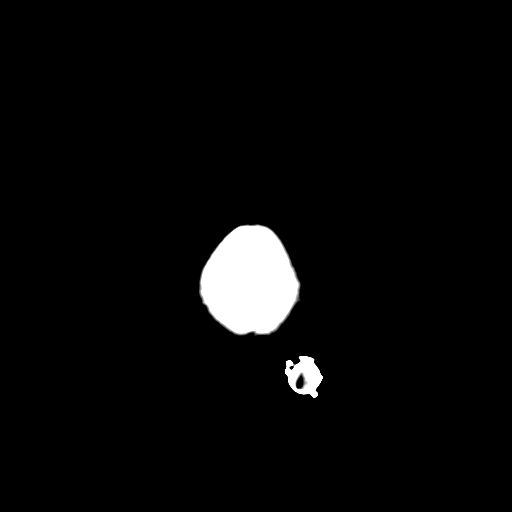
[im 28/30  bone]
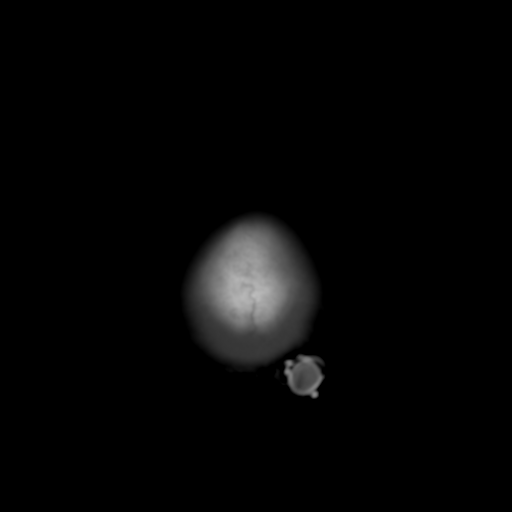

[Series 4: coronal soft tissue · coronal · 0.31mm/px · 3 of 64 slices shown]
[im 22/64  brain]
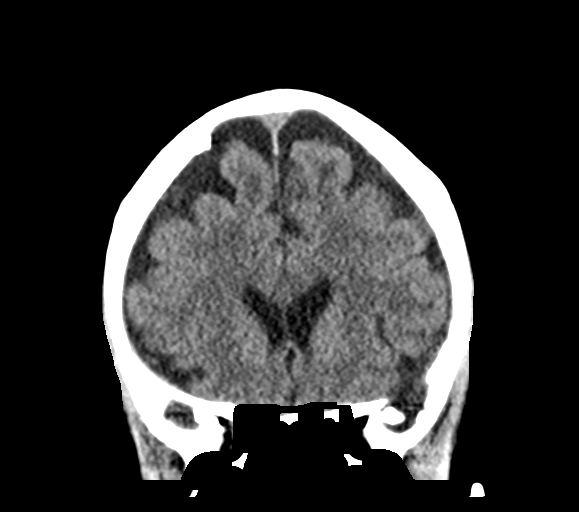
[im 29/64  brain]
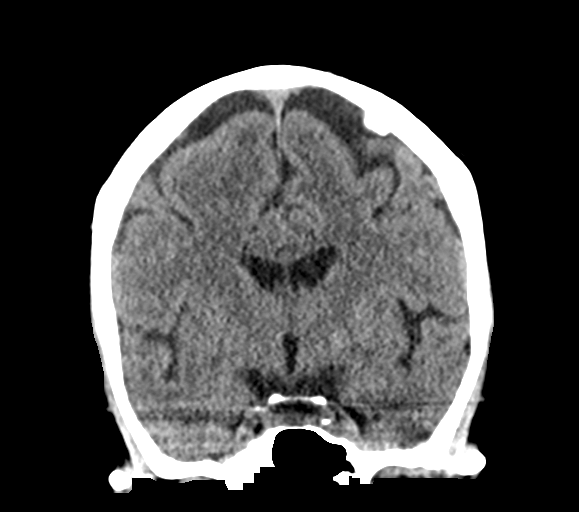
[im 36/64  brain]
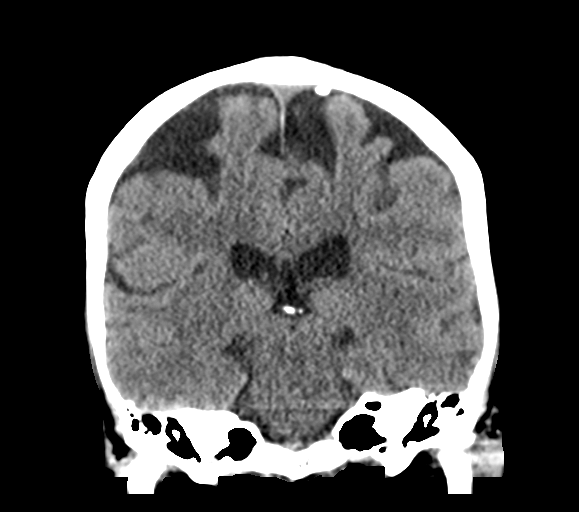

[Series 5: sagittal soft tissue · sagittal · 0.32mm/px · 3 of 49 slices shown]
[im 17/49  brain]
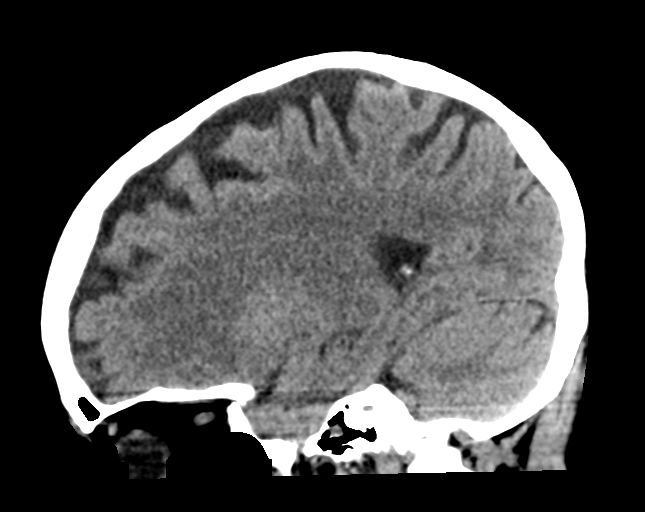
[im 25/49  brain]
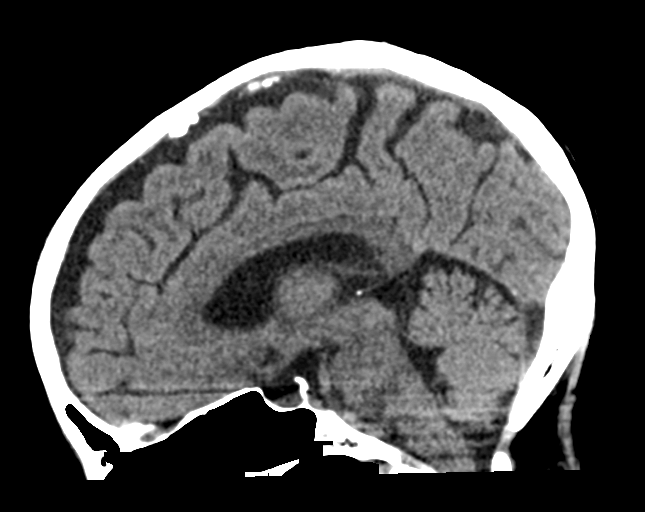
[im 33/49  brain]
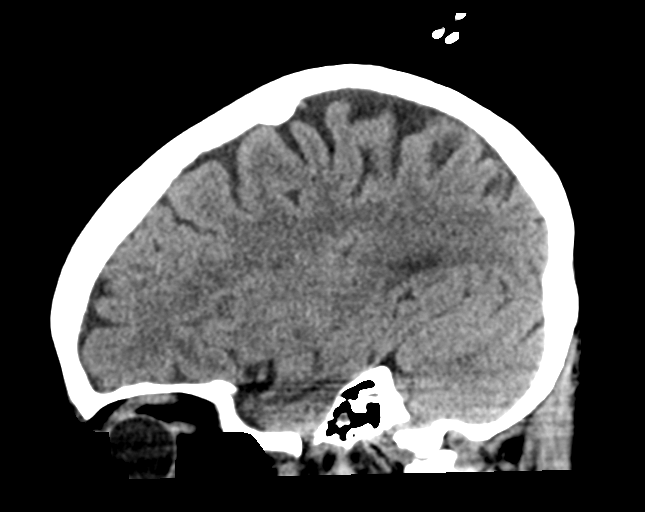

[15 of 47 positions shown; findings below may reference images not displayed]

FINDINGS: Brain: No change. No focal abnormality seen affecting the brainstem
or cerebellum. Cerebral hemispheres appear normal by CT. No evidence
of stroke, intra-axial mass, hemorrhage, hydrocephalus or
extra-axial fluid collection. There are some frontal dural
calcifications most consistent with hyperostosis. Small heavily
calcified meningioma is not excluded but not favored. No mass-effect
upon the brain.

Vascular: There is atherosclerotic calcification of the major
vessels at the base of the brain.

Skull: Negative

Sinuses/Orbits: Clear/normal

Other: None
IMPRESSION: No acute finding by CT. Normal appearance of the brain other than
age related volume loss.

Dural calcifications at the right posterior frontal vertex could be
insignificant dural calcifications or heavily calcified meningiomas
without mass-effect upon the brain.

## 2023-09-11 IMAGING — US US CAROTID DUPLEX BILAT
1 series · 13 of 24 positions shown · non-contrast
Comparison: 06/19/2021

CLINICAL DATA: Hypertension, stroke symptoms, syncope and diabetes

EXAM:
BILATERAL CAROTID DUPLEX ULTRASOUND
TECHNIQUE: Gray scale imaging, color Doppler and duplex ultrasound were
performed of bilateral carotid and vertebral arteries in the neck.

[Series 1: us carotid bilateral · 13 of 66 slices shown]
[im 1/66]
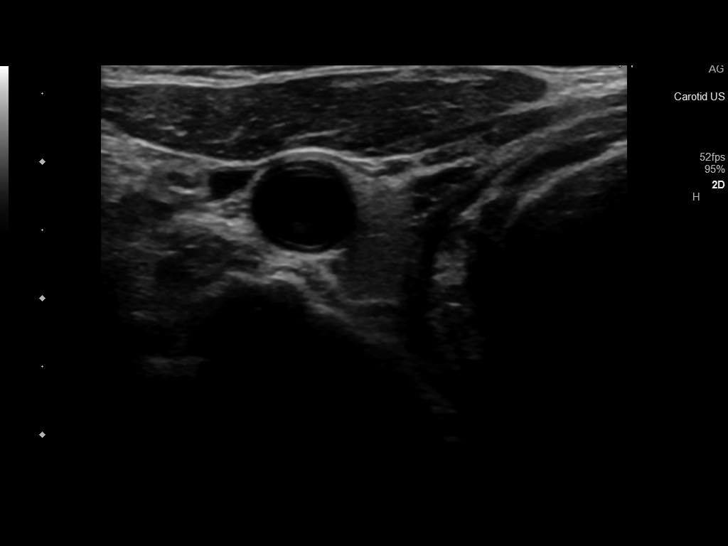
[im 6/66]
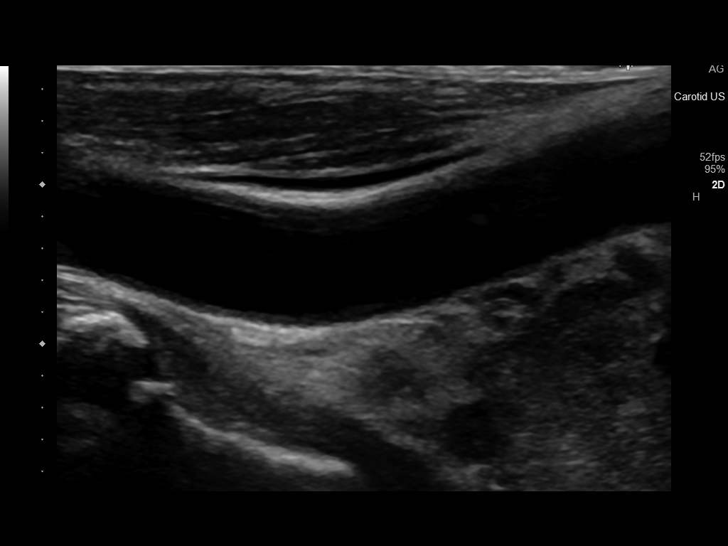
[im 12/66]
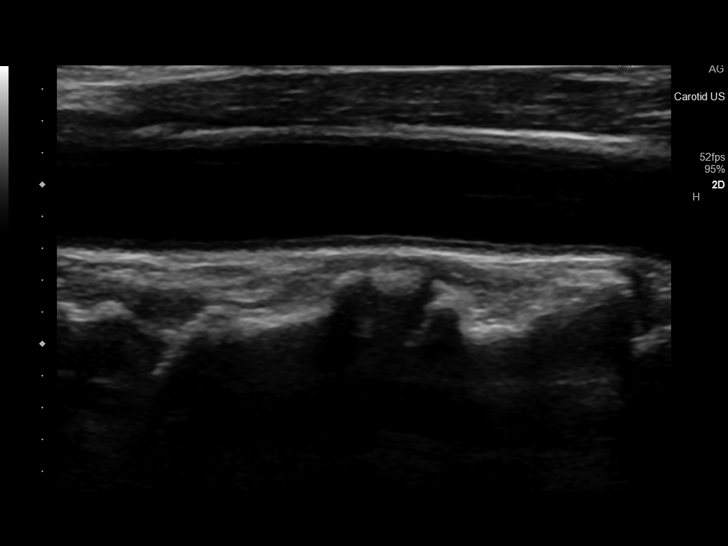
[im 17/66]
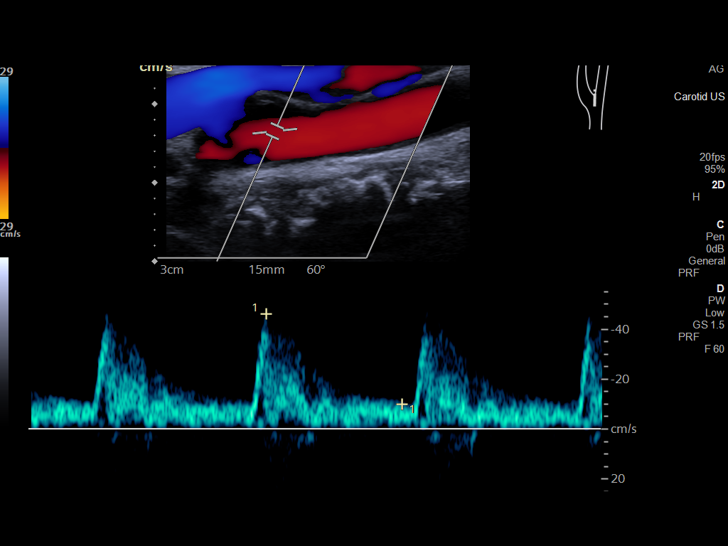
[im 23/66]
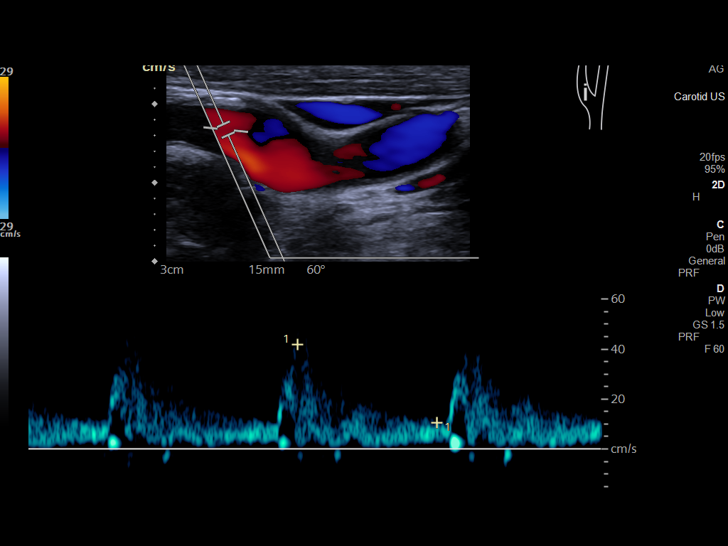
[im 29/66]
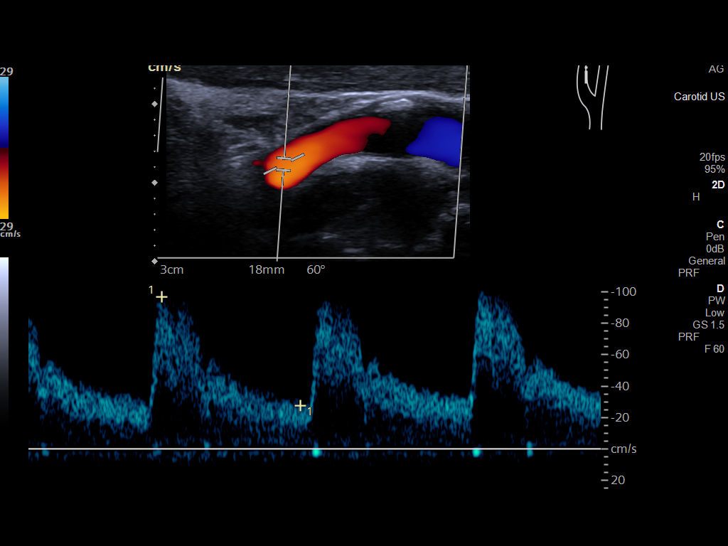
[im 34/66]
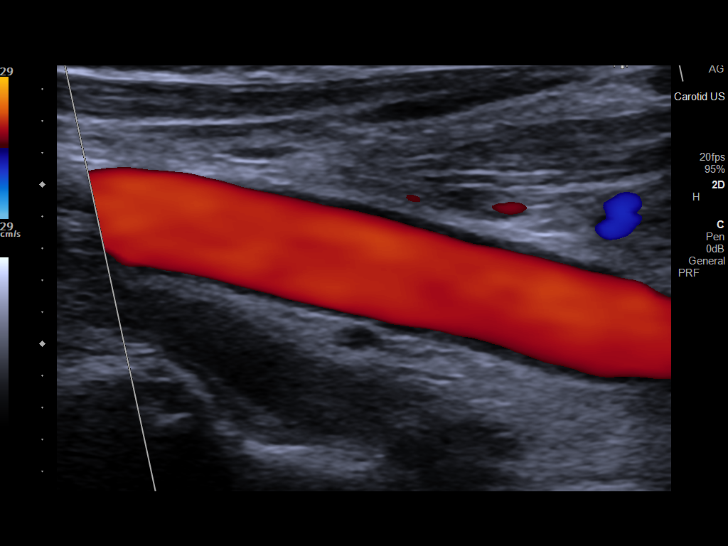
[im 37/66]
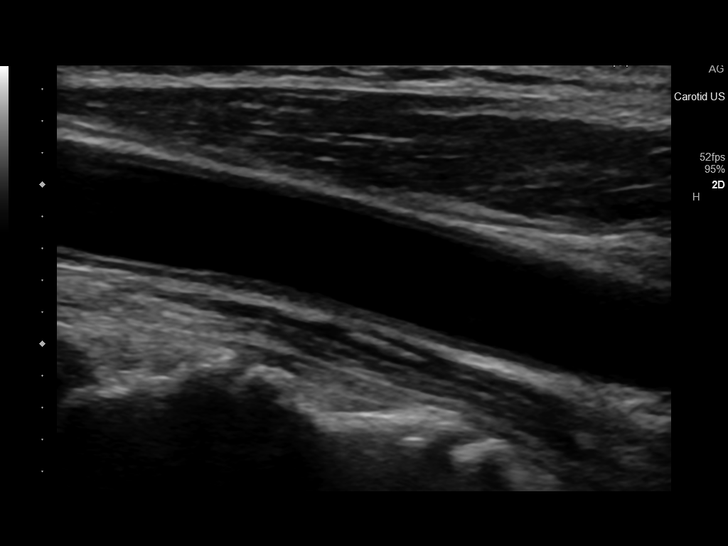
[im 43/66]
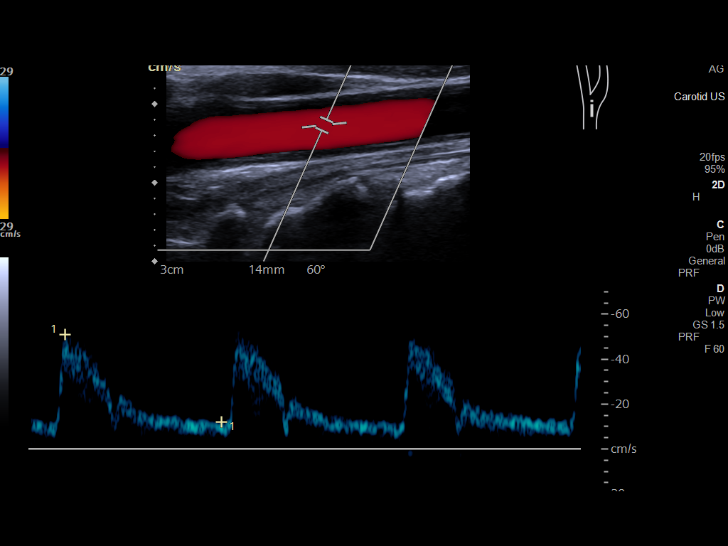
[im 49/66]
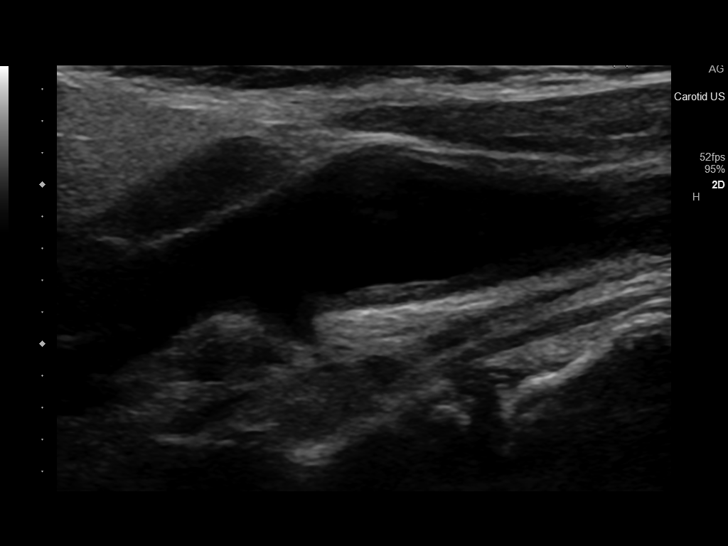
[im 54/66]
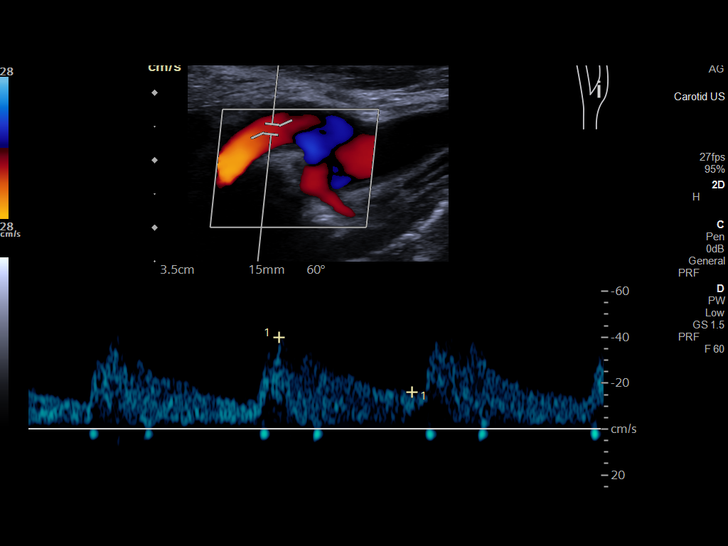
[im 60/66]
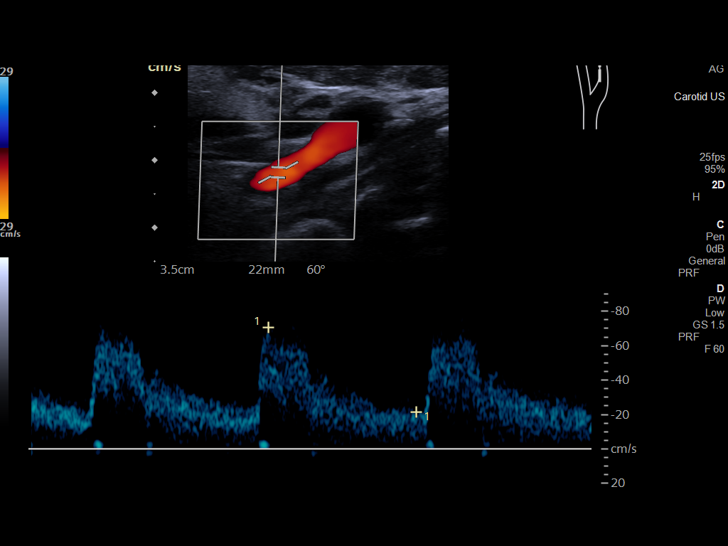
[im 66/66]
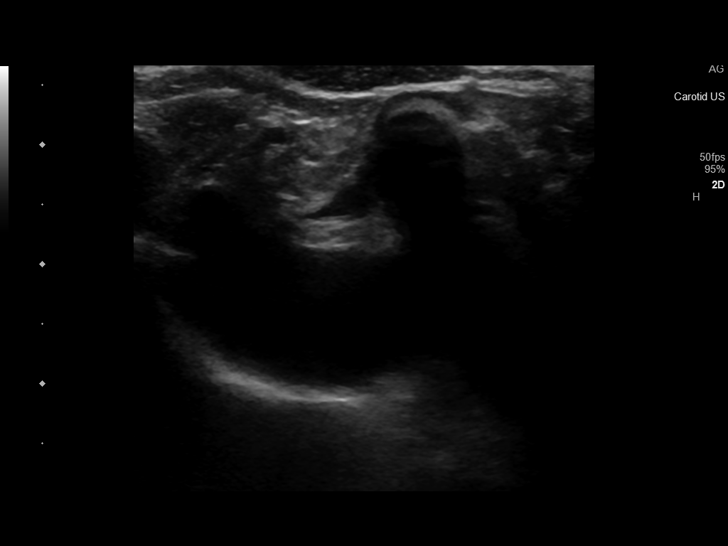

[13 of 24 positions shown; findings below may reference images not displayed]

FINDINGS: Criteria: Quantification of carotid stenosis is based on velocity
parameters that correlate the residual internal carotid diameter
with NASCET-based stenosis levels, using the diameter of the distal
internal carotid lumen as the denominator for stenosis measurement.

The following velocity measurements were obtained:

RIGHT

ICA: 97/28 cm/sec

CCA: 53/16 cm/sec

SYSTOLIC ICA/CCA RATIO:

ECA: 40 cm/sec

LEFT

ICA: 71/22 cm/sec

CCA: 51/12 cm/sec

SYSTOLIC ICA/CCA RATIO:

ECA: 30 cm/sec

RIGHT CAROTID ARTERY: Minor hypoechoic bifurcation atherosclerosis.
Negative for stenosis, velocity elevation, turbulent flow. Degree of
narrowing less than 50% by ultrasound criteria.

RIGHT VERTEBRAL ARTERY:  Normal antegrade flow

LEFT CAROTID ARTERY: Minor hypoechoic intimal thickening and trace
hypoechoic plaque formation. Negative for stenosis, velocity
elevation, turbulent flow. Degree of less than 50% by ultrasound
criteria.

LEFT VERTEBRAL ARTERY:  Normal antegrade flow
IMPRESSION: Minor carotid atherosclerosis. Negative for stenosis. Degree of
narrowing less than 50% bilaterally by ultrasound criteria.

Patent antegrade vertebral flow bilaterally

## 2023-09-11 NOTE — Telephone Encounter (Signed)
No problems post procedure. 

## 2023-09-17 ENCOUNTER — Ambulatory Visit
Admission: RE | Admit: 2023-09-17 | Discharge: 2023-09-17 | Disposition: A | Discharge status: Home / Self Care | Payer: 59 | Source: Ambulatory Visit | Attending: Pain Medicine | Admitting: Pain Medicine

## 2023-09-17 DIAGNOSIS — M545 Low back pain, unspecified: Secondary | ICD-10-CM

## 2023-09-17 DIAGNOSIS — M961 Postlaminectomy syndrome, not elsewhere classified: Secondary | ICD-10-CM | POA: Diagnosis not present

## 2023-09-17 DIAGNOSIS — G8929 Other chronic pain: Secondary | ICD-10-CM

## 2023-09-17 DIAGNOSIS — M79604 Pain in right leg: Secondary | ICD-10-CM | POA: Diagnosis not present

## 2023-09-17 DIAGNOSIS — M47816 Spondylosis without myelopathy or radiculopathy, lumbar region: Secondary | ICD-10-CM

## 2023-09-17 DIAGNOSIS — Z91041 Radiographic dye allergy status: Secondary | ICD-10-CM | POA: Diagnosis not present

## 2023-09-17 DIAGNOSIS — M5459 Other low back pain: Secondary | ICD-10-CM | POA: Diagnosis not present

## 2023-09-17 DIAGNOSIS — M4316 Spondylolisthesis, lumbar region: Secondary | ICD-10-CM

## 2023-09-17 DIAGNOSIS — M47817 Spondylosis without myelopathy or radiculopathy, lumbosacral region: Secondary | ICD-10-CM | POA: Diagnosis not present

## 2023-09-17 DIAGNOSIS — M5136 Other intervertebral disc degeneration, lumbar region with discogenic back pain only: Secondary | ICD-10-CM | POA: Diagnosis not present

## 2023-09-17 DIAGNOSIS — G96191 Perineural cyst: Secondary | ICD-10-CM | POA: Diagnosis not present

## 2023-09-26 ENCOUNTER — Encounter: Payer: Self-pay | Admitting: Pain Medicine

## 2023-09-26 ENCOUNTER — Ambulatory Visit (HOSPITAL_BASED_OUTPATIENT_CLINIC_OR_DEPARTMENT_OTHER): Payer: 59 | Admitting: Pain Medicine

## 2023-09-26 DIAGNOSIS — G039 Meningitis, unspecified: Secondary | ICD-10-CM | POA: Insufficient documentation

## 2023-09-26 DIAGNOSIS — Z91199 Patient's noncompliance with other medical treatment and regimen due to unspecified reason: Secondary | ICD-10-CM

## 2023-09-26 DIAGNOSIS — Z09 Encounter for follow-up examination after completed treatment for conditions other than malignant neoplasm: Secondary | ICD-10-CM

## 2023-09-26 NOTE — Progress Notes (Signed)
(  09/26/2023) NO-SHOW to postprocedure evaluation.

## 2023-10-31 ENCOUNTER — Other Ambulatory Visit: Payer: Self-pay | Admitting: Physician Assistant

## 2023-10-31 ENCOUNTER — Other Ambulatory Visit: Payer: Self-pay | Admitting: Family Medicine

## 2023-10-31 DIAGNOSIS — M5416 Radiculopathy, lumbar region: Secondary | ICD-10-CM

## 2023-10-31 DIAGNOSIS — H02531 Eyelid retraction right upper eyelid: Secondary | ICD-10-CM | POA: Diagnosis not present

## 2023-10-31 DIAGNOSIS — H189 Unspecified disorder of cornea: Secondary | ICD-10-CM | POA: Diagnosis not present

## 2023-10-31 DIAGNOSIS — N9419 Other specified dyspareunia: Secondary | ICD-10-CM

## 2023-10-31 DIAGNOSIS — H5789 Other specified disorders of eye and adnexa: Secondary | ICD-10-CM | POA: Diagnosis not present

## 2023-11-01 ENCOUNTER — Other Ambulatory Visit: Payer: Self-pay | Admitting: Physician Assistant

## 2023-11-01 DIAGNOSIS — E1122 Type 2 diabetes mellitus with diabetic chronic kidney disease: Secondary | ICD-10-CM | POA: Diagnosis not present

## 2023-11-01 DIAGNOSIS — Z9884 Bariatric surgery status: Secondary | ICD-10-CM | POA: Diagnosis not present

## 2023-11-01 DIAGNOSIS — E538 Deficiency of other specified B group vitamins: Secondary | ICD-10-CM | POA: Diagnosis not present

## 2023-11-01 DIAGNOSIS — Z0000 Encounter for general adult medical examination without abnormal findings: Secondary | ICD-10-CM | POA: Diagnosis not present

## 2023-11-01 DIAGNOSIS — M961 Postlaminectomy syndrome, not elsewhere classified: Secondary | ICD-10-CM | POA: Diagnosis not present

## 2023-11-01 DIAGNOSIS — N1832 Chronic kidney disease, stage 3b: Secondary | ICD-10-CM | POA: Diagnosis not present

## 2023-11-01 DIAGNOSIS — E042 Nontoxic multinodular goiter: Secondary | ICD-10-CM | POA: Diagnosis not present

## 2023-11-01 DIAGNOSIS — Z23 Encounter for immunization: Secondary | ICD-10-CM | POA: Diagnosis not present

## 2023-11-01 DIAGNOSIS — D631 Anemia in chronic kidney disease: Secondary | ICD-10-CM | POA: Diagnosis not present

## 2023-11-01 LAB — HEMOGLOBIN A1C: Hemoglobin A1C: 6.4

## 2023-11-01 NOTE — Telephone Encounter (Signed)
Requested Prescriptions  Pending Prescriptions Disp Refills   hydrOXYzine (VISTARIL) 100 MG capsule [Pharmacy Med Name: HYDROXYZINE PAM 100MG  CAP 100 Capsule] 90 capsule 1    Sig: TAKE 1 CAPSULE BY MOUTH 3 TIMES DAILY     Ear, Nose, and Throat:  Antihistamines 2 Failed - 10/31/2023  7:21 PM      Failed - Cr in normal range and within 360 days    Creat  Date Value Ref Range Status  10/30/2017 1.35 (H) 0.50 - 0.99 mg/dL Final    Comment:    For patients >57 years of age, the reference limit for Creatinine is approximately 13% higher for people identified as African-American. .    Creatinine, Ser  Date Value Ref Range Status  06/19/2023 1.35 (H) 0.44 - 1.00 mg/dL Final         Passed - Valid encounter within last 12 months    Recent Outpatient Visits           5 months ago Type 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, without long-term current use of insulin (HCC)   [place] [facility] [place], [place], PA-C   6 months ago Other microscopic hematuria   [place] [facility] [place], [place], PA-C   8 months ago Benign essential HTN   [place] [facility] [place], [place], PA-C   9 months ago Left leg swelling   [place] [facility] [place], [place], MD   10 months ago Type 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, without long-term current use of insulin (HCC)   [place] [facility] [place], [place], PA-C       Future Appointments             In 1 month Croitoru, Cadiz, MD [facility]             gabapentin (NEURONTIN) 300 MG capsule [Pharmacy Med Name: GABAPENTIN 300 MG CAPS 300 Capsule] 90 capsule 1    Sig: TAKE 1 CAPSULE BY MOUTH 3 TIMES DAILY     Neurology: Anticonvulsants - gabapentin Failed - 10/31/2023  7:21 PM      Failed - Cr in normal range and within 360 days    Creat  Date Value Ref Range Status   10/30/2017 1.35 (H) 0.50 - 0.99 mg/dL Final    Comment:    For patients >26 years of age, the reference limit for Creatinine is approximately 13% higher for people identified as African-American. .    Creatinine, Ser  Date Value Ref Range Status  06/19/2023 1.35 (H) 0.44 - 1.00 mg/dL Final         Passed - Completed PHQ-2 or PHQ-9 in the last 360 days      Passed - Valid encounter within last 12 months    Recent Outpatient Visits           5 months ago Type 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, without long-term current use of insulin (HCC)   [place] [facility] [place], [place], PA-C   6 months ago Other microscopic hematuria   [place] [facility] [place], [place], PA-C   8 months ago Benign essential HTN   [place] [facility] [place], [place], [place]   9 months ago Left leg swelling   [place] [facility] [place], [place], MD   10 months ago Type 2 diabetes mellitus with stage 3b chronic  kidney disease, without long-term current use of insulin (HCC)   [place] [facility] [place], [place], PA-C       Future Appointments             In 1 month Croitoru, Rachelle Hora, MD [facility]

## 2023-11-01 NOTE — Telephone Encounter (Signed)
Requested medication (s) are due for refill today: No  Requested medication (s) are on the active medication list: No  Last refill:  ?  Future visit scheduled:   Notes to clinic:  Medication not her list.    Requested Prescriptions  Pending Prescriptions Disp Refills   omeprazole (PRILOSEC) 20 MG capsule [Pharmacy Med Name: OMEPRAZOLE DR 20 MG CAPSULE 20 Capsule] 30 capsule 10    Sig: TAKE 1 CAPSULE BY MOUTH ONCE DAILY     Gastroenterology: Proton Pump Inhibitors Passed - 11/01/2023  8:10 AM      Passed - Valid encounter within last 12 months    Recent Outpatient Visits           5 months ago Type 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, without long-term current use of insulin (HCC)   [place] [facility] [place], [place], PA-C   6 months ago Other microscopic hematuria   [place] [facility] [place] [place], [place], PA-C   8 months ago Benign essential HTN   [place] [facility] [place], [place], [place]   9 months ago Left leg swelling   [place] [facility] [place], [place], MD   10 months ago Type 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, without long-term current use of insulin (HCC)   [place] [facility] [place], [place], PA-C       Future Appointments             In 1 month Croitoru, Rachelle Hora, MD [facility]

## 2023-11-01 NOTE — Telephone Encounter (Signed)
Rx 12/20/22 #90 [id number]- too soon Requested Prescriptions  Pending Prescriptions Disp Refills   rosuvastatin (CRESTOR) 40 MG tablet [Pharmacy Med Name: ROSUVASTATIN 40MG  TAB 40 Tablet] 29 tablet 10    Sig: TAKE 1 TABLET BY MOUTH ONCE DAILY     Cardiovascular:  Antilipid - Statins 2 Failed - 10/31/2023  7:21 PM      Failed - Cr in normal range and within 360 days    Creat  Date Value Ref Range Status  10/30/2017 1.35 (H) 0.50 - 0.99 mg/dL Final    Comment:    For patients >13 years of age, the reference limit for Creatinine is approximately 13% higher for people identified as African-American. .    Creatinine, Ser  Date Value Ref Range Status  06/19/2023 1.35 (H) 0.44 - 1.00 mg/dL Final         Failed - Lipid Panel in normal range within the last 12 months    Cholesterol, Total  Date Value Ref Range Status  12/05/2020 163 100 - 199 mg/dL Final   Cholesterol  Date Value Ref Range Status  09/06/2021 134 0 - 200 mg/dL Final  40/98/1191 478 0 - 200 mg/dL Final   Ldl Cholesterol, Calc  Date Value Ref Range Status  01/18/2014 69 0 - 100 mg/dL Final   LDL Chol Calc (NIH)  Date Value Ref Range Status  12/05/2020 86 0 - 99 mg/dL Final   LDL Cholesterol  Date Value Ref Range Status  09/06/2021 63 0 - 99 mg/dL Final    Comment:           Total Cholesterol/HDL:CHD Risk Coronary Heart Disease Risk Table                     Men   Women  1/2 Average Risk   3.4   3.3  Average Risk       5.0   4.4  2 X Average Risk   9.6   7.1  3 X Average Risk  23.4   11.0        Use the calculated Patient Ratio above and the CHD Risk Table to determine the patient's CHD Risk.        ATP III CLASSIFICATION (LDL):  <100     mg/dL   Optimal  295-621  mg/dL   Near or Above                    Optimal  130-159  mg/dL   Borderline  308-657  mg/dL   High  >846     mg/dL   Very High Performed at [facility], [address] [place], [place] [address]    HDL Cholesterol  Date Value  Ref Range Status  01/18/2014 74 (H) 40 - 60 mg/dL Final   HDL  Date Value Ref Range Status  09/06/2021 60 >40 mg/dL Final  28/41/3244 61 >01 mg/dL Final   Triglycerides  Date Value Ref Range Status  09/06/2021 54 <150 mg/dL Final  02/72/5366 53 0 - 200 mg/dL Final         Passed - Patient is not pregnant      Passed - Valid encounter within last 12 months    Recent Outpatient Visits           5 months ago Type 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, without long-term current use of insulin [facility])   [place] [facility] [place] [place], [place], [place]  6 months ago Other microscopic hematuria   [place] [facility] [place], [place], [place]   8 months ago Benign essential HTN   [place] [facility] [place], [place], [place]   9 months ago Left leg swelling   [place] [facility] [place], [place], MD   10 months ago Type 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, without long-term current use of insulin (HCC)   [place] [facility] [place], [place], PA-C       Future Appointments             In 1 month Croitoru, Rachelle Hora, MD [facility]

## 2023-11-20 DIAGNOSIS — I889 Nonspecific lymphadenitis, unspecified: Secondary | ICD-10-CM | POA: Diagnosis not present

## 2023-12-02 ENCOUNTER — Ambulatory Visit: Payer: 59 | Admitting: Cardiovascular Disease

## 2023-12-10 ENCOUNTER — Encounter: Payer: Self-pay | Admitting: Physician Assistant

## 2023-12-10 IMAGING — MG MM DIGITAL SCREENING BILAT W/ TOMO AND CAD
8 series · 9 of 24 positions shown · non-contrast
Comparison: Previous exam(s).

CLINICAL DATA: Screening.

EXAM:
DIGITAL SCREENING BILATERAL MAMMOGRAM WITH TOMOSYNTHESIS AND CAD
TECHNIQUE: Bilateral screening digital craniocaudal and mediolateral oblique
mammograms were obtained. Bilateral screening digital breast
tomosynthesis was performed. The images were evaluated with
computer-aided detection.

[L CC synth-2D]
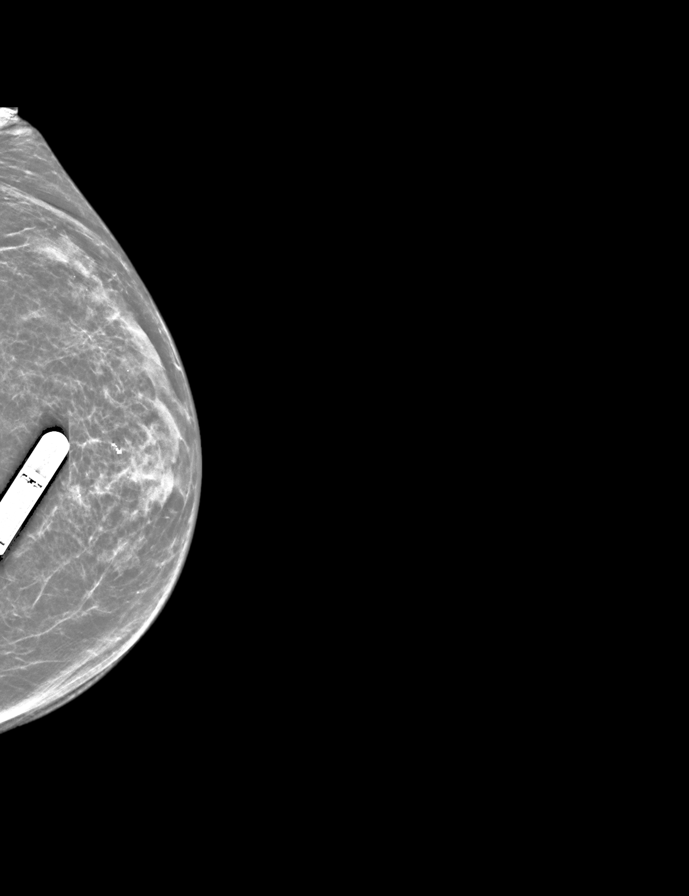

[L MLO synth-2D]
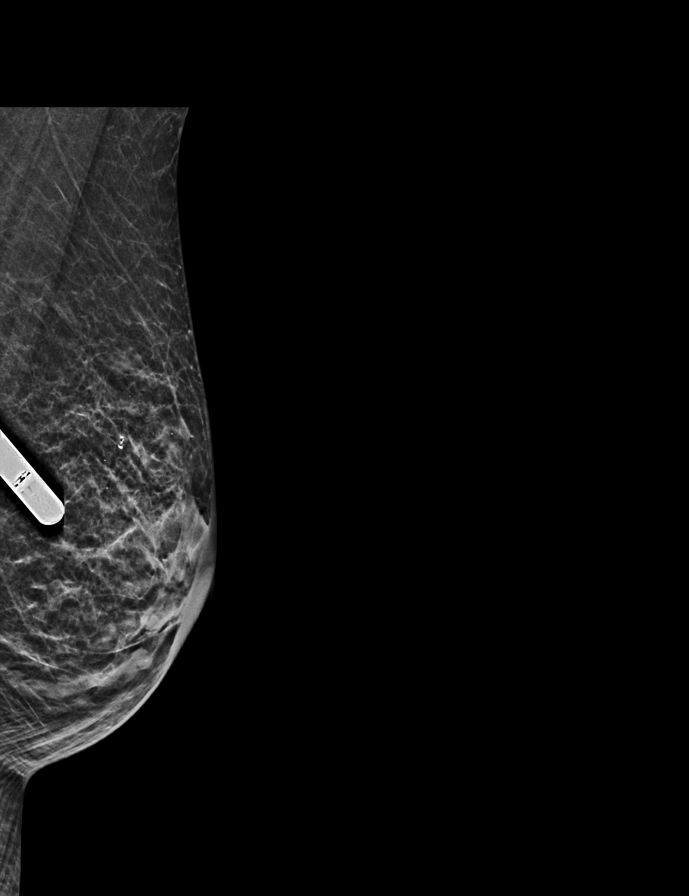

[R CC synth-2D]
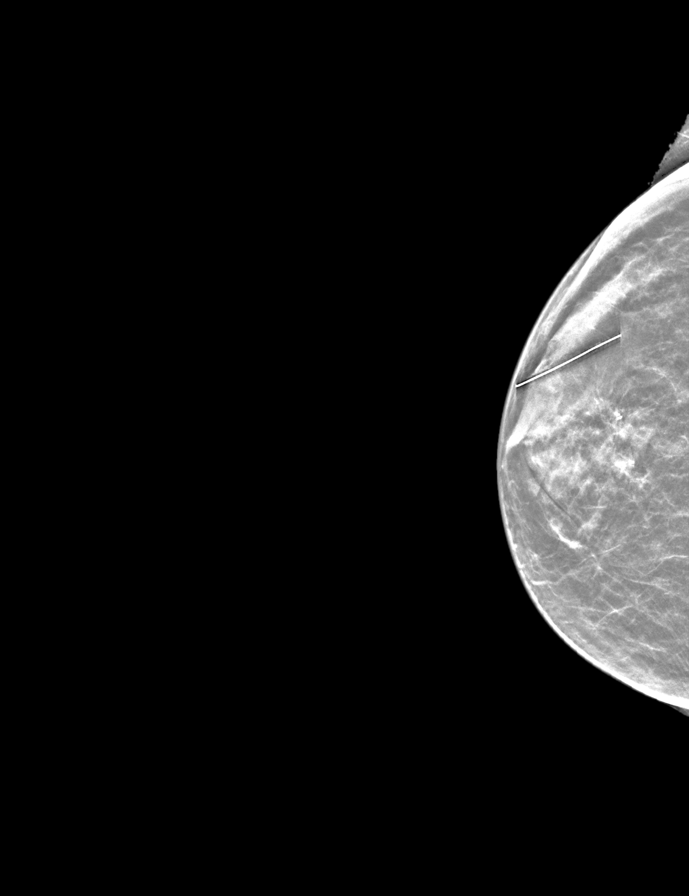

[R MLO synth-2D]
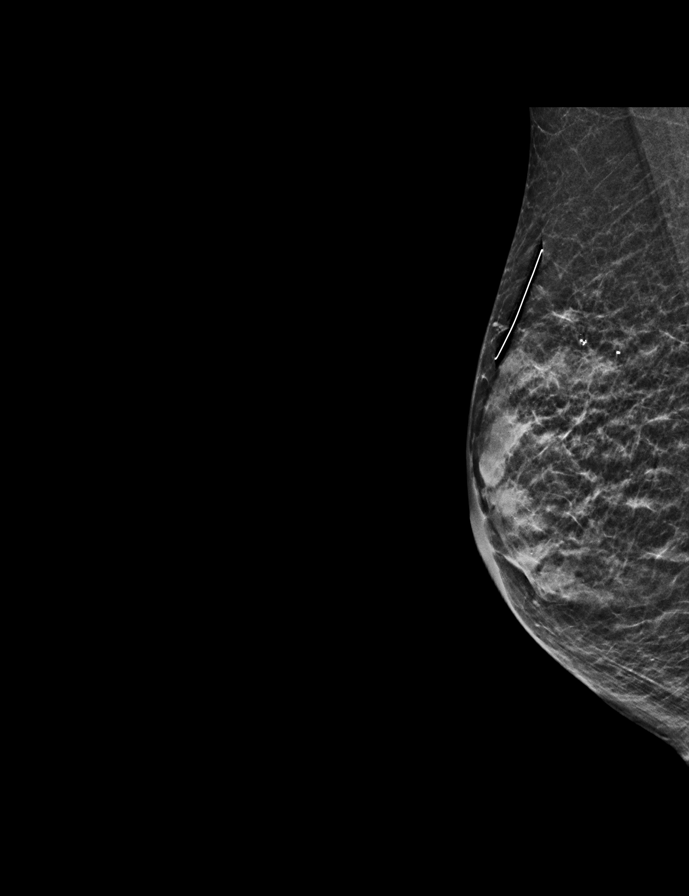

[L CC tomo · 2 of 38 frames shown]
[frame 13/38]
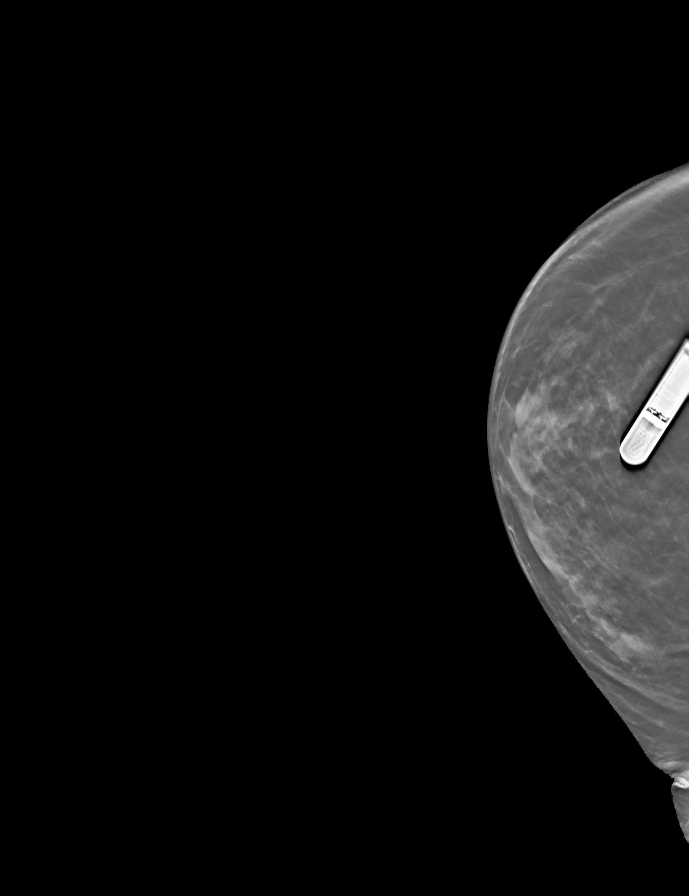
[frame 19/38]
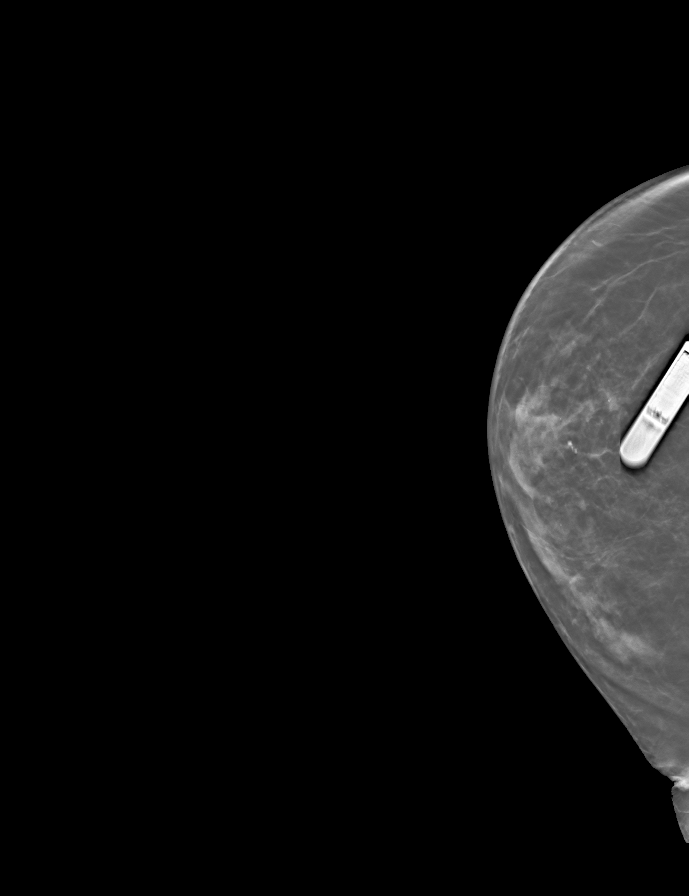

[R CC tomo · tomo slice 21/40.0]
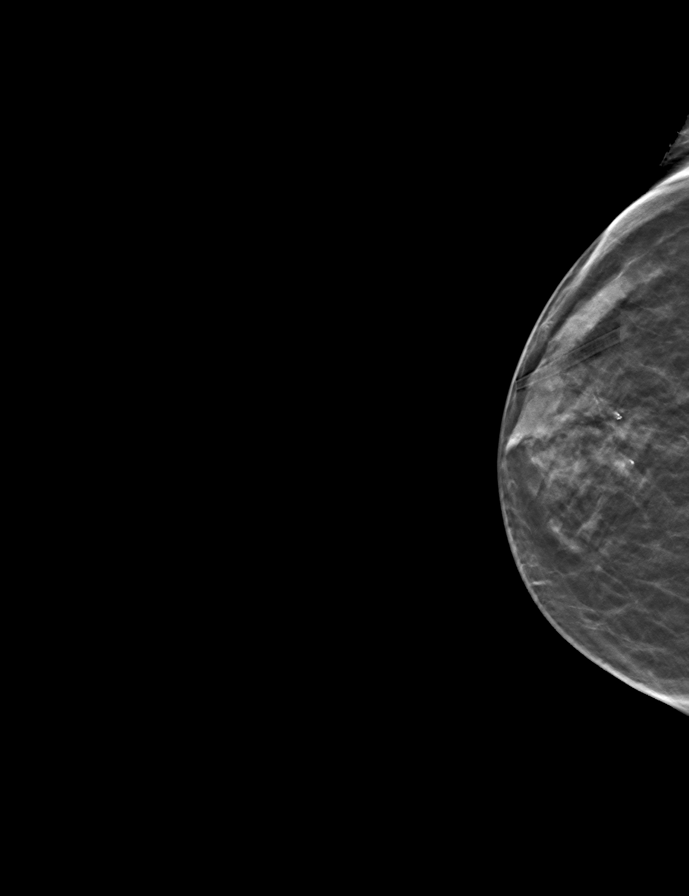

[L MLO tomo · tomo slice 17/34.0]
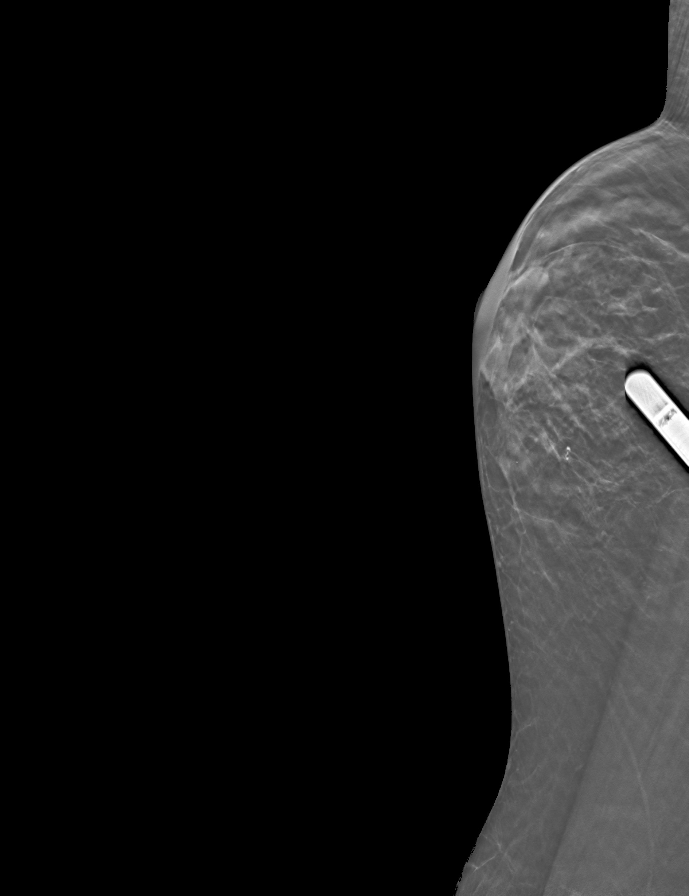

[R MLO tomo · tomo slice 17/34.0]
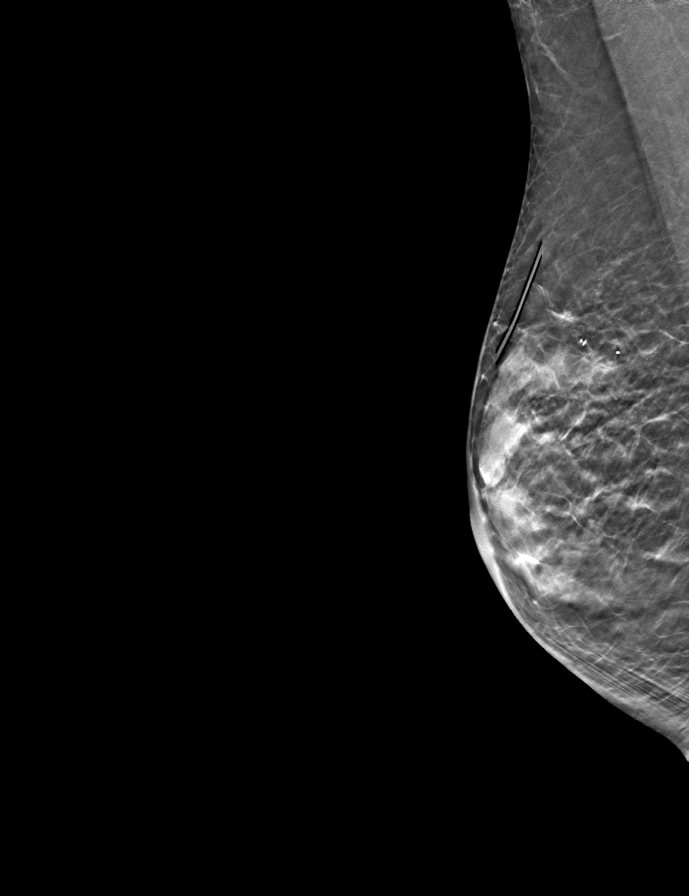

[9 of 24 positions shown; findings below may reference images not displayed]

ACR Breast Density Category b: There are scattered areas of
fibroglandular density.
FINDINGS: In the left breast, calcifications warrant further evaluation. In
the right breast, no findings suspicious for malignancy.
IMPRESSION: Further evaluation is suggested for calcifications in the left
breast.

RECOMMENDATION:
Diagnostic mammogram of the left breast. (Code:[id number])

The patient will be contacted regarding the findings, and additional
imaging will be scheduled.

BI-RADS CATEGORY  0: Incomplete. Need additional imaging evaluation
and/or prior mammograms for comparison.

## 2023-12-25 IMAGING — MG MM DIGITAL DIAGNOSTIC UNILAT*L* W/ TOMO W/ CAD
5 series · 6 of 9 positions shown · non-contrast
Comparison: Previous exam(s).

CLINICAL DATA: Callback for possible LEFT breast calcifications

EXAM:
DIGITAL DIAGNOSTIC UNILATERAL LEFT MAMMOGRAM WITH TOMOSYNTHESIS AND
CAD
TECHNIQUE: Left digital diagnostic mammography and breast tomosynthesis was
performed. The images were evaluated with computer-aided detection.

[L CC (1 of 2)]
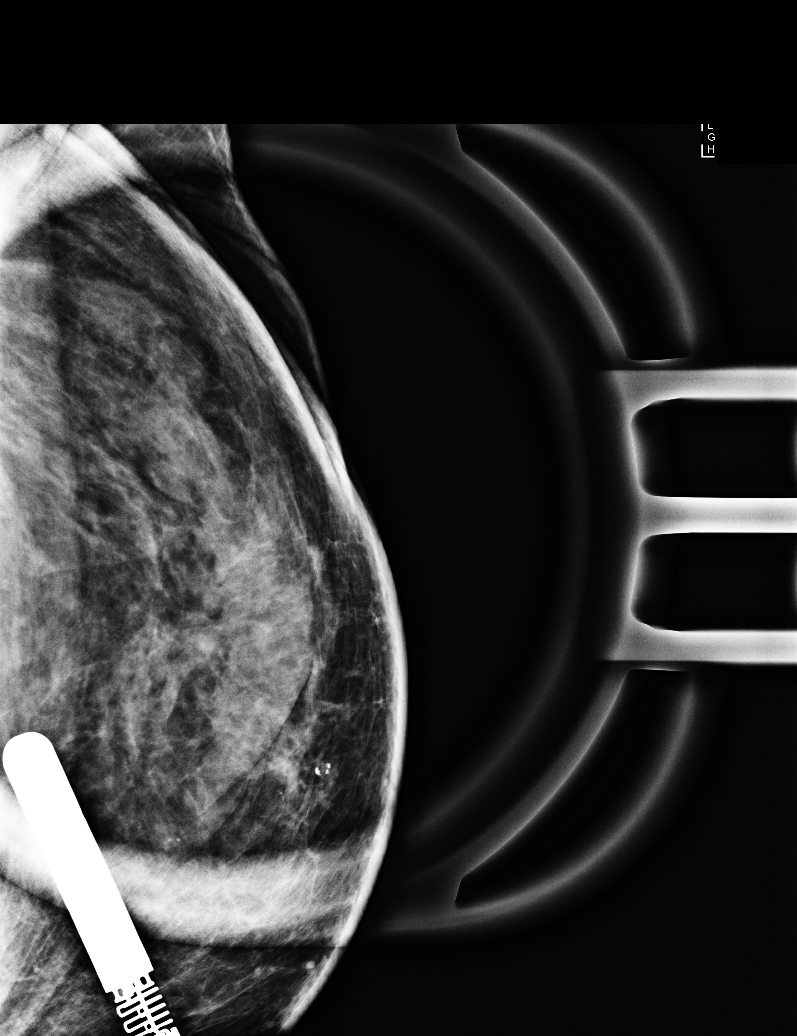

[L ML]
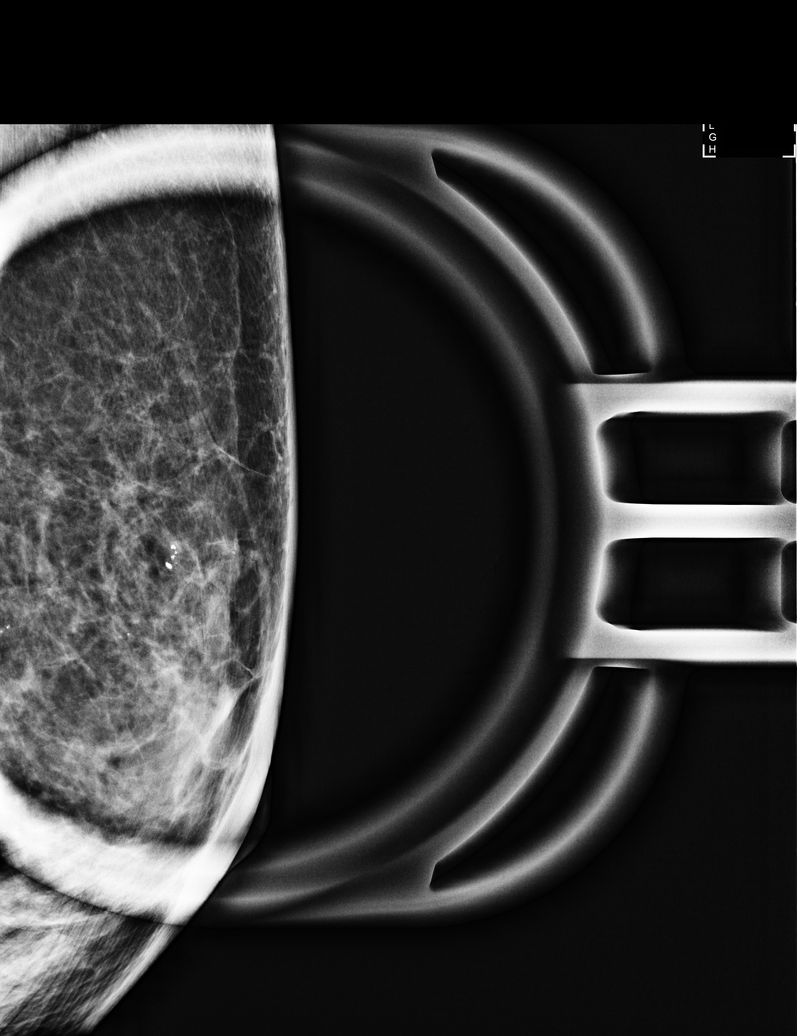

[L CC (2 of 2)]
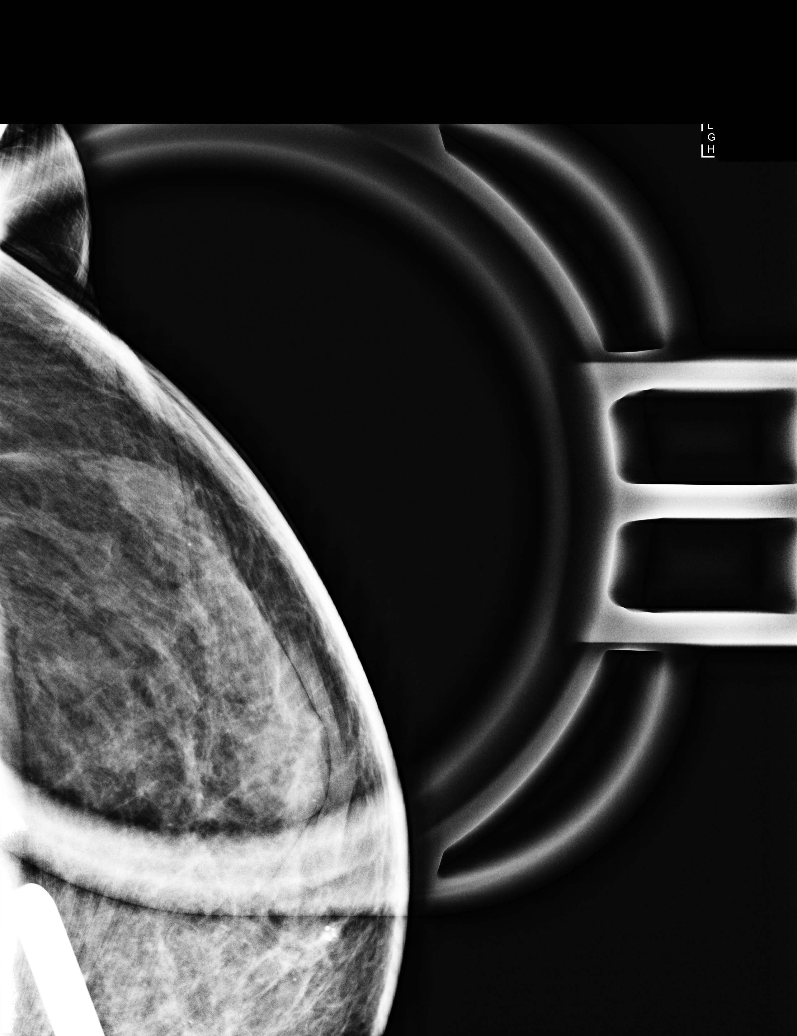

[L ML synth-2D]
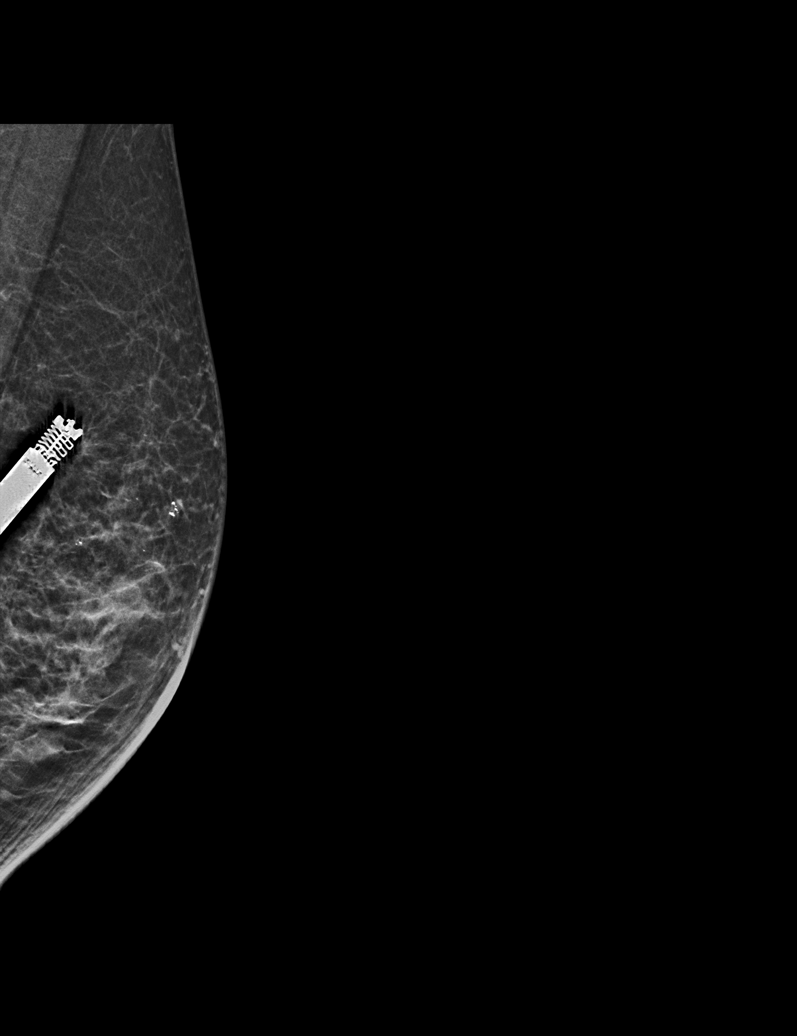

[L ML tomo · 2 of 36 frames shown]
[frame 12/36]
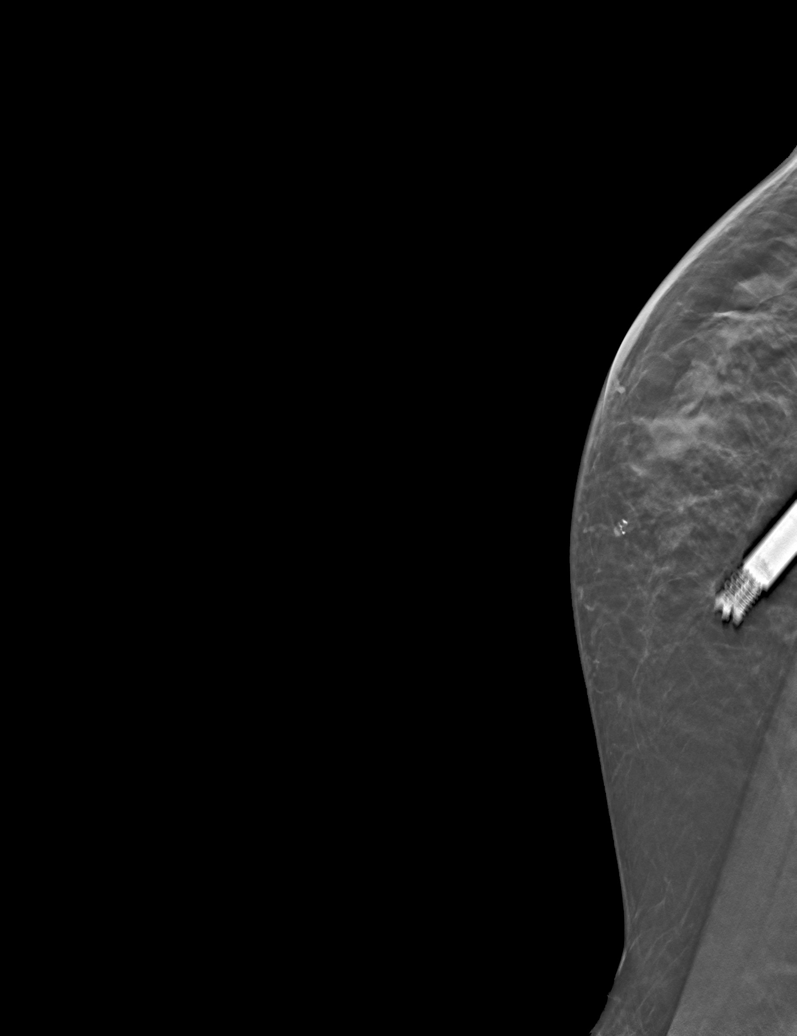
[frame 19/36]
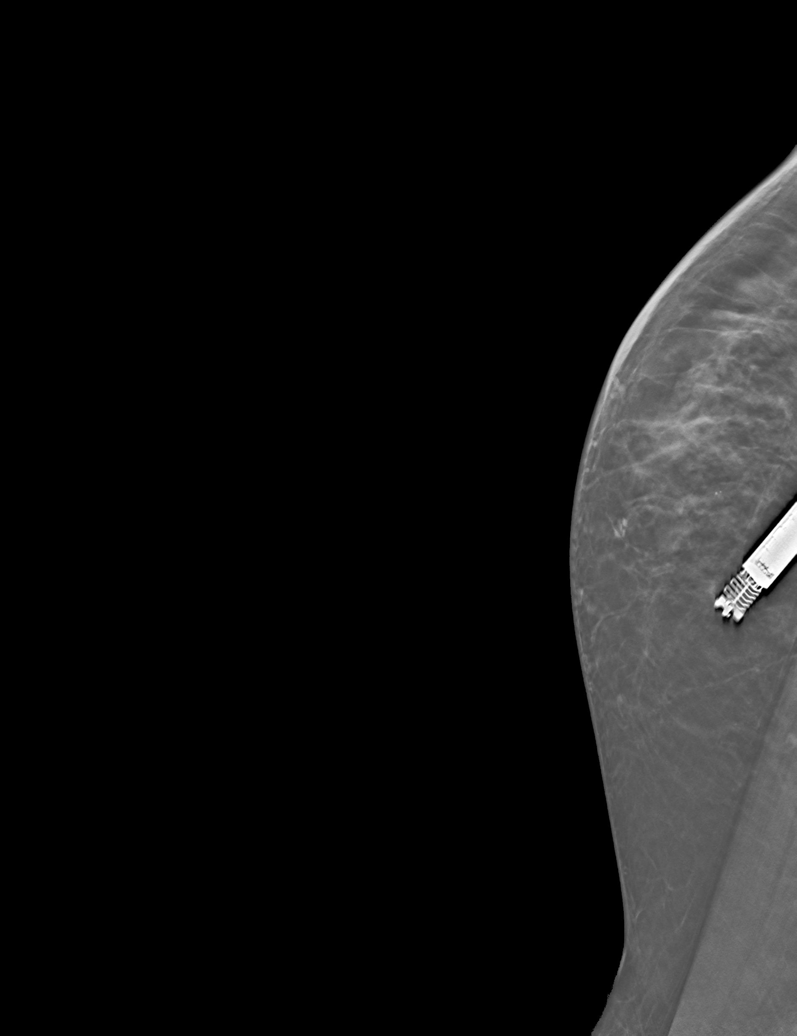

[6 of 9 positions shown; findings below may reference images not displayed]

ACR Breast Density Category b: There are scattered areas of
fibroglandular density.
FINDINGS: Spot magnification views of the LEFT breast demonstrate that
possible increasing calcifications are due to tomosynthesis related
pseudocalcification artifact. There are scattered benign punctate
and dystrophic calcifications noted without any suspicious
morphology or grouping. No suspicious mass, distortion, or
microcalcifications are identified to suggest presence of
malignancy. LEFT breast cardiac loop recorder.
IMPRESSION: No mammographic evidence of malignancy. Questioned increasing
calcifications are due to tomosynthesis related pseudocalcification
artifact. Scattered benign calcifications are noted.

RECOMMENDATION:
Screening mammogram in one year.(Code:[id number])

I have discussed the findings and recommendations with the patient.
If applicable, a reminder letter will be sent to the patient
regarding the next appointment.

BI-RADS CATEGORY  2: Benign.

## 2023-12-27 ENCOUNTER — Other Ambulatory Visit: Payer: Self-pay | Admitting: Physician Assistant

## 2023-12-27 DIAGNOSIS — N9419 Other specified dyspareunia: Secondary | ICD-10-CM

## 2023-12-27 DIAGNOSIS — M5416 Radiculopathy, lumbar region: Secondary | ICD-10-CM

## 2023-12-30 NOTE — Telephone Encounter (Signed)
 Requested Prescriptions  Refused Prescriptions Disp Refills   hydrOXYzine  (VISTARIL ) 100 MG capsule [Pharmacy Med Name: HYDROXYZINE  PAM 100MG  CAP 100 Capsule] 90 capsule 10    Sig: TAKE 1 CAPSULE BY MOUTH THREE TIMES DAILY     Ear, Nose, and Throat:  Antihistamines 2 Failed - 12/30/2023  4:40 PM      Failed - Cr in normal range and within 360 days    Creat  Date Value Ref Range Status  10/30/2017 1.35 (H) 0.50 - 0.99 mg/dL Final    Comment:    For patients >56 years of age, the reference limit for Creatinine is approximately 13% higher for people identified as African-American. .    Creatinine, Ser  Date Value Ref Range Status  06/19/2023 1.35 (H) 0.44 - 1.00 mg/dL Final         Passed - Valid encounter within last 12 months    Recent Outpatient Visits           7 months ago Type 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, without long-term current use of insulin  [facility])   [place] [facility] [place], [place], PA-C   8 months ago Other microscopic hematuria   [place] [facility] [place], [place], PA-C   10 months ago Benign essential HTN   [place] [facility] [place], [place], [place]   11 months ago Left leg swelling   [place] [place], [place], [place]   1 year ago Type 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, without long-term current use of insulin  [facility])   [facility] [place], [place], PA-C       Future Appointments             In 1 week Simmons-Robinson, Rockie, MD [facility]   In 3 weeks Croitoru, [place], MD [facility]             gabapentin  (NEURONTIN ) 300 MG capsule [Pharmacy Med Name: GABAPENTIN  300 MG CAPS 300 Capsule] 90 capsule 10    Sig: TAKE 1 CAPSULE BY MOUTH THREE TIMES DAILY     Neurology: Anticonvulsants - gabapentin  Failed - 12/30/2023  4:40 PM       Failed - Cr in normal range and within 360 days    Creat  Date Value Ref Range Status  10/30/2017 1.35 (H) 0.50 - 0.99 mg/dL Final    Comment:    For patients >54 years of age, the reference limit for Creatinine is approximately 13% higher for people identified as African-American. .    Creatinine, Ser  Date Value Ref Range Status  06/19/2023 1.35 (H) 0.44 - 1.00 mg/dL Final         Passed - Completed PHQ-2 or PHQ-9 in the last 360 days      Passed - Valid encounter within last 12 months    Recent Outpatient Visits           7 months ago Type 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, without long-term current use of insulin  [facility])   [place] [facility] [place], [place], PA-C   8 months ago Other microscopic hematuria   [place] [place], [place], PA-C   10 months ago Benign essential HTN   [place] [facility] [place], [place], [place]   11 months ago Left leg swelling    [facility] [place], [place],  MD   1 year ago Type 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, without long-term current use of insulin  (HCC)   [place] [facility] [place], [place], PA-C       Future Appointments             In 1 week Simmons-Robinson, Rockie, MD [facility]   In 3 weeks Croitoru, Jerel, MD [facility]

## 2024-01-03 ENCOUNTER — Encounter: Payer: Self-pay | Admitting: Oncology

## 2024-01-10 ENCOUNTER — Ambulatory Visit (INDEPENDENT_AMBULATORY_CARE_PROVIDER_SITE_OTHER): Payer: 59 | Admitting: Family Medicine

## 2024-01-10 ENCOUNTER — Encounter: Payer: Self-pay | Admitting: Family Medicine

## 2024-01-10 VITALS — BP 160/78 | HR 57 | Ht 63.0 in | Wt 145.5 lb

## 2024-01-10 DIAGNOSIS — I10 Essential (primary) hypertension: Secondary | ICD-10-CM

## 2024-01-10 DIAGNOSIS — E042 Nontoxic multinodular goiter: Secondary | ICD-10-CM

## 2024-01-10 DIAGNOSIS — M1712 Unilateral primary osteoarthritis, left knee: Secondary | ICD-10-CM | POA: Diagnosis not present

## 2024-01-10 DIAGNOSIS — G459 Transient cerebral ischemic attack, unspecified: Secondary | ICD-10-CM

## 2024-01-10 DIAGNOSIS — N1832 Chronic kidney disease, stage 3b: Secondary | ICD-10-CM | POA: Diagnosis not present

## 2024-01-10 DIAGNOSIS — E1122 Type 2 diabetes mellitus with diabetic chronic kidney disease: Secondary | ICD-10-CM | POA: Diagnosis not present

## 2024-01-10 DIAGNOSIS — R0989 Other specified symptoms and signs involving the circulatory and respiratory systems: Secondary | ICD-10-CM

## 2024-01-10 DIAGNOSIS — M1990 Unspecified osteoarthritis, unspecified site: Secondary | ICD-10-CM | POA: Diagnosis not present

## 2024-01-10 NOTE — Patient Instructions (Signed)
VISIT SUMMARY:  During today's visit, we discussed your persistent headaches, thyroid nodules, joint pain, hypertension, diabetes, neuropathy, hyperlipidemia, and seizure disorder. We reviewed your current symptoms, medications, and management plans for each condition.  YOUR PLAN:  -HEADACHES: You have been experiencing headaches 2-3 times per week for the past 6-7 months. These headaches sometimes wake you up. We recommend discussing this with your neurologist, Dr. Sherryll Burger, during your next appointment to consider imaging or preventive medications.  -HYPERTENSION: Your blood pressure was elevated today at 171/82, although it is usually well-controlled at home. We will recheck your blood pressure manually today, and you should continue your current medications. Please follow up with your cardiologist on February 3rd.  -THYROID NODULES: You have a history of thyroid nodules, with a previous benign biopsy. Endocrinology did not recommend additional follow up as your nodules were stable from 2007 to 2019. I recommend checking the levels on regular basis and monitoring for any change in the size of the nodules.   -DIABETES MELLITUS TYPE 2: Your diabetes is well-controlled with a recent A1c of 6.4. Continue your current management and medications. We will check your A1c again in May and order a urine albumin test to monitor your kidney function.  -PERIPHERAL NEUROPATHY: You have chronic peripheral neuropathy managed with gabapentin. You reported swelling in your hands and feet. Continue taking gabapentin as prescribed and follow up with a podiatrist for foot care.  -HYPERLIPIDEMIA: Your cholesterol levels are managed with Crestor. Continue taking Crestor as prescribed.  -SEIZURE DISORDER: Your seizures are managed with Keppra, and you have not reported any recent seizure activity. Continue taking Keppra 500 mg twice a day.  -LEFT KNEE PAIN: You have chronic left knee pain likely due to arthritis. Discuss  this pain with your pain management doctor during your next appointment and consider consulting with an orthopedic specialist.  INSTRUCTIONS:  - Follow up with your cardiologist on February 3rd. - Discuss your headaches with Dr. Clelia Croft during your next neurology appointment. - Schedule a follow-up appointment in one month to review your ultrasound and lab results. - Continue monitoring your blood pressure and blood glucose levels regularly.

## 2024-01-10 NOTE — Progress Notes (Unsigned)
New Patient patient visit   Patient: Julie Acevedo   DOB: 08-28-1951   73 y.o. Female  MRN: [id number] Visit Date: 01/10/2024  Today's healthcare provider: Ronnald Ramp, MD   Chief Complaint  Patient presents with   Headache   Subjective     HPI     Headache   Location Descriptor In the frontal area.  Characterized as ache.  Pain was noted as 4/10.  This started suddenly.  Lasting for hours.  It is worse at random times.  Since onset it is stable.  Treatments attempted include other (Takes excedrine for it).  Response to treatment was mild improvement.      Last edited by Bevelyn Ngo, CMA on 01/10/2024 11:13 AM.       Discussed the use of AI scribe software for clinical note transcription with the patient, who gave verbal consent to proceed.  History of Present Illness   The patient, a 73 year old female, presents with a chief complaint of persistent headaches and concerns about thyroid nodules. The headaches, described as not severe but annoying, have been occurring for approximately six to seven months, with a frequency of two to three times per week. The patient reports that the headaches sometimes wake her up from sleep, and at other times, she develops later in the day. Over-the-counter Excedrin provides some relief.  The patient also reports joint pain, particularly in the left knee, which she describes as painful and producing a noticeable sound. She has been receiving injections for finger joint pain from an orthopedic specialist but has not yet sought treatment for the knee pain. She plans to discuss this issue with her pain management specialist, who currently provides injections for back pain.  The patient has a history of hypertension, and although she reports that her blood pressure is usually well-controlled at home, a high reading was recorded during the current visit. She also has a history of heart disease, including a heart attack in 1999, and  reports occasional chest discomfort, described as a non-severe pain that is nonetheless noticeable.  The patient has a history of thyroid nodules, with three nodules identified in previous examinations. The largest nodule was biopsied and found to be benign in 2018. The patient reports no changes in the size of the nodules but expresses concern about lymph nodes in the neck area, which have been tender and enlarged for approximately six to seven months.  The patient has a history of diabetes, which is managed through diet control. She reports recent swelling in the hands and feet. She also has a history of seizures, for which she takes Keppra, and neuropathy, for which she takes gabapentin. She is also on Crestor for cholesterol management.      Past Medical History:  Diagnosis Date   Anemia    Arthritis    COVID-19 11/19/2019   Diverticulitis    DM (diabetes mellitus) (HCC)    typ e 2   History of methicillin resistant staphylococcus aureus (MRSA) 2017   HTN (hypertension)    Hyperlipidemia    L-S radiculopathy 06/11/2015   Left leg swelling 01/22/2023   Lower abdominal pain 08/08/2022   Memory difficulties 09/01/2015   Multiple thyroid nodules    Myocardial infarction [facility]) 1995   Partial small bowel obstruction (HCC) 04/23/2016   SBO (small bowel obstruction) (HCC)    Short-term memory loss    Thyroid nodule     Medications: Outpatient Medications Prior to Visit  Medication Sig  ACCU-CHEK GUIDE test strip USE TO CHECK BLOOD SUGAR UP TO 4 TIMES A DAY AS DIRECTED   Accu-Chek Softclix Lancets lancets SMARTSIG:Topical 1-4 Times Daily   atorvastatin (LIPITOR) 40 MG tablet Take 1 tablet by mouth every morning.   blood glucose meter kit and supplies Dispense Accu-Chek Guide. Use up to four times daily as directed. 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   calcitRIOL (ROCALTROL) 0.25 MCG capsule Take 0.25 mcg by mouth daily.   diazoxide (PROGLYCEM) 50 MG/ML suspension take 1ml (50mg  total)  BY MOUTH EVERY 8 HOURS   gabapentin (NEURONTIN) 300 MG capsule TAKE 1 CAPSULE BY MOUTH 3 TIMES DAILY   hydrOXYzine (VISTARIL) 100 MG capsule TAKE 1 CAPSULE BY MOUTH 3 TIMES DAILY   levETIRAcetam (KEPPRA) 500 MG tablet Take 1 tablet (500 mg total) by mouth 2 (two) times daily.   LINZESS 290 MCG CAPS capsule TAKE ONE CAPSULE BY MOUTH BEFORE BREAKFAST daily   metoprolol succinate (TOPROL-XL) 25 MG 24 hr tablet Take 1 tablet (25 mg total) by mouth every morning.   mirtazapine (REMERON SOL-TAB) 15 MG disintegrating tablet TAKE 1 TABLET BY MOUTH EVERYDAY AT BEDTIME   mirtazapine (REMERON) 30 MG tablet Take 1 tablet by mouth at bedtime.   omeprazole (PRILOSEC) 20 MG capsule TAKE 1 CAPSULE BY MOUTH ONCE DAILY   rivastigmine (EXELON) 3 MG capsule Take 3 mg by mouth 2 (two) times daily.   rosuvastatin (CRESTOR) 40 MG tablet Take 1 tablet (40 mg total) by mouth daily.   traZODone (DESYREL) 50 MG tablet Take 1 tablet by mouth at bedtime.   triamcinolone cream (KENALOG) 0.5 % Apply 1 Application topically 3 (three) times daily as needed (flares).   No facility-administered medications prior to visit.    Review of Systems  Last metabolic panel Lab Results  Component Value Date   GLUCOSE 94 06/19/2023   NA 141 06/19/2023   K 3.9 06/19/2023   CL 105 06/19/2023   CO2 28 06/19/2023   BUN 11 06/19/2023   CREATININE 1.35 (H) 06/19/2023   GFRNONAA 42 (L) 06/19/2023   CALCIUM 8.9 06/19/2023   PHOS 3.4 11/20/2022   PROT 7.5 06/19/2023   ALBUMIN 4.0 06/19/2023   LABGLOB 2.6 12/05/2020   AGRATIO 1.5 12/05/2020   BILITOT 0.5 06/19/2023   ALKPHOS 209 (H) 06/19/2023   AST 61 (H) 06/19/2023   ALT 53 (H) 06/19/2023   ANIONGAP 8 06/19/2023   Last hemoglobin A1c Lab Results  Component Value Date   HGBA1C 6.4 11/01/2023   Last thyroid functions Lab Results  Component Value Date   TSH 2.483 01/18/2023   T4TOTAL 6.5 01/27/2018     {See past labs  Heme  Chem  Endocrine  Serology  Results  Review (optional):1}   Objective    BP (!) 160/78   Pulse (!) 57   Ht 5\' 3"  (1.6 m)   Wt 145 lb 8 oz (66 kg)   SpO2 100%   BMI 25.77 kg/m   BP Readings from Last 3 Encounters:  01/10/24 (!) 160/78  09/10/23 (!) 148/88  08/21/23 (!) 153/78   Wt Readings from Last 3 Encounters:  01/10/24 145 lb 8 oz (66 kg)  09/10/23 139 lb (63 kg)  08/21/23 139 lb (63 kg)    {See vitals history (optional):1}    Physical Exam  Physical Exam   VITALS: BP- 171/82 MEASUREMENTS: WT- 145 pounds NECK: Left anterior cervical chain lymph nodes palpable and tender. Left submandibular area tender, no mass effect. EXTREMITIES: Bilateral ankle  edema.       No results found for any visits on 01/10/24.  Assessment & Plan     Problem List Items Addressed This Visit       Cardiovascular and Mediastinum   TIA (transient ischemic attack)   Essential hypertension - Primary     Endocrine   Type 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, without long-term current use of insulin (HCC)   Relevant Orders   Urine microalbumin-creatinine with uACR   Multiple thyroid nodules     Musculoskeletal and Integument   Arthritis of knee, degenerative   Arthritis   Other Visit Diagnoses       Tender lymph node       Relevant Orders   CBC w/Diff/Platelet   US Soft Tissue Head/Neck (NON-THYROID)           Headaches Intermittent headaches occurring 2-3 times per week for the past 6-7 months, sometimes waking her up. Currently managed with Excedrin. Recommended to discuss with neurologist Dr. Clelia Croft and Luther Parody during next neurology appointment to consider imaging or preventive medications. - Discuss headaches with Dr. Clelia Croft and Luther Parody during next neurology appointment - Consider imaging or preventive medications as recommended by neurology  Hypertension Elevated blood pressure reading of 171/82 today. Normally well-controlled at home. History of myocardial infarction in 1999 with  well-managed cardiac status. Recommended to recheck blood pressure manually today and continue current medications. Follow up with cardiologist on February 3rd. - Recheck blood pressure manually today - Continue current antihypertensive medications - Follow up with cardiologist on February 3rd  Thyroid Nodules Multinodular goiter with a dominant right inferior thyroid nodule. Previous benign biopsy in 2018 and stable ultrasound findings in 2019. Reports a palpable lymph node and concerns about nodule size. Recommended to order ultrasound of the neck and CBC with differential. Schedule follow-up appointment in one month to review results. - Order ultrasound of the neck - Order CBC with differential - Schedule follow-up appointment in one month to review results  Diabetes Mellitus Type 2 Well-controlled diabetes with an A1c of 6.4 in November. Regular blood glucose monitoring. No recent complications reported. Recommended to check A1c again in May and continue current management and medications. Order urine albumin to monitor kidney function. - Check A1c again in May - Continue current diabetes management and medications - Order urine albumin to monitor kidney function  Peripheral Neuropathy Chronic peripheral neuropathy managed with gabapentin 300 mg three times a day. Reports swelling in hands and feet. Recommended to continue gabapentin and follow up with podiatrist for foot care. - Continue gabapentin 300 mg three times a day - Follow up with podiatrist for foot care  Hyperlipidemia Chronic hyperlipidemia managed with Crestor. Recommended to continue Crestor as prescribed. - Continue Crestor as prescribed  Seizure Disorder Seizures managed with Keppra 500 mg twice a day. No recent seizure activity reported. Recommended to continue Keppra. - Continue Keppra 500 mg twice a day  Left Knee Pain Chronic left knee pain with audible crepitus, likely due to arthritis. No current treatment  from orthopedic specialists. Receives injections for finger and back pain. Recommended to discuss knee pain with pain management doctor and orthopedic specialist. - Discuss knee pain with pain management doctor during next appointment - Consider discussing knee pain with orthopedic specialist  General Health Maintenance Routine health maintenance discussed including diabetes management, blood pressure monitoring, and foot care. - Monitor blood pressure a nd blood glucose levels regularly  Follow-up - Follow up with cardiologist on  February 3rd - Follow up with neurologist Dr. Clelia Croft next month - Schedule follow-up appointment in one month to review ultrasound and lab results.         Return in about 1 month (around 02/10/2024) for LAD in neck.         Ronnald Ramp, MD  [facility] [phone] (phone) [phone] (fax)  [facility]

## 2024-01-13 ENCOUNTER — Encounter: Payer: Self-pay | Admitting: Oncology

## 2024-01-15 ENCOUNTER — Ambulatory Visit: Payer: 59

## 2024-01-17 ENCOUNTER — Ambulatory Visit
Admission: RE | Admit: 2024-01-17 | Discharge: 2024-01-17 | Disposition: A | Discharge status: Home / Self Care | Payer: 59 | Source: Ambulatory Visit | Attending: Family Medicine | Admitting: Family Medicine

## 2024-01-17 ENCOUNTER — Encounter: Payer: Self-pay | Admitting: Oncology

## 2024-01-17 DIAGNOSIS — R590 Localized enlarged lymph nodes: Secondary | ICD-10-CM | POA: Diagnosis not present

## 2024-01-17 DIAGNOSIS — R0989 Other specified symptoms and signs involving the circulatory and respiratory systems: Secondary | ICD-10-CM | POA: Diagnosis not present

## 2024-01-17 DIAGNOSIS — E1122 Type 2 diabetes mellitus with diabetic chronic kidney disease: Secondary | ICD-10-CM | POA: Diagnosis not present

## 2024-01-17 DIAGNOSIS — N1832 Chronic kidney disease, stage 3b: Secondary | ICD-10-CM | POA: Diagnosis not present

## 2024-01-18 LAB — CBC WITH DIFFERENTIAL/PLATELET
Basophils Absolute: 0 10*3/uL (ref 0.0–0.2)
Basos: 0 %
EOS (ABSOLUTE): 0.1 10*3/uL (ref 0.0–0.4)
Eos: 1 %
Hematocrit: 34.4 % (ref 34.0–46.6)
Hemoglobin: 11 g/dL — ABNORMAL LOW (ref 11.1–15.9)
Immature Grans (Abs): 0 10*3/uL (ref 0.0–0.1)
Immature Granulocytes: 0 %
Lymphocytes Absolute: 1.8 10*3/uL (ref 0.7–3.1)
Lymphs: 26 %
MCH: 26.7 pg (ref 26.6–33.0)
MCHC: 32 g/dL (ref 31.5–35.7)
MCV: 84 fL (ref 79–97)
Monocytes Absolute: 0.5 10*3/uL (ref 0.1–0.9)
Monocytes: 7 %
Neutrophils Absolute: 4.4 10*3/uL (ref 1.4–7.0)
Neutrophils: 66 %
Platelets: 295 10*3/uL (ref 150–450)
RBC: 4.12 x10E6/uL (ref 3.77–5.28)
RDW: 14.3 % (ref 11.7–15.4)
WBC: 6.7 10*3/uL (ref 3.4–10.8)

## 2024-01-18 LAB — MICROALBUMIN / CREATININE URINE RATIO
Creatinine, Urine: 207.9 mg/dL
Microalb/Creat Ratio: 25 mg/g{creat} (ref 0–29)
Microalbumin, Urine: 51.1 ug/mL

## 2024-01-19 NOTE — Progress Notes (Unsigned)
Cardiology office Note   Date:  01/20/2024   ID:  Julie Acevedo, Julie Acevedo, MRN [id number]  PCP:  Ronnald Ramp, MD  Cardiologist:  Alwyn Pea, MD /Sharyn Brilliant (ILR) Electrophysiologist:  None   Evaluation Performed:  Follow-Up Visit  Chief Complaint:  chest pain  History of Present Illness:    Julie Acevedo is a 73 y.o. female with history of a single syncopal event in April 2018 (no recurrence and no arrhythmia recorded by implantable loop recorder during 3-year follow-up).  She also has diet-controlled diabetes mellitus with chronic kidney disease (stage III), mild hypertension, occasional palpitations and hyperlipidemia.  She does not have known coronary or peripheral vascular disease.  Her loop recorder has reached end of service.  She has not had syncope or presyncope.  She occasionally has an ache in her left chest that radiates into her shoulder.  This occurs unpredictably at rest and is not associated with physical activity or emotional states.  It has some positional qualities and is most likely musculoskeletal.  He does not have relationship to breathing, coughing or eating.  She has had fewer complaints of orthostatic dizziness since stopping her diuretic.  Blood pressure remains well controlled.  Diabetes control is exceptionally good with a hemoglobin A1c of 5.1%.  She is taking only very low-dose of metformin on days when her blood sugar is high.  Her most recent cholesterol profile from last September showed an LDL of 63 and HDL of 60.  In September 2022 she underwent an elective EGD but subsequent developed altered mental status, glucose level was 44 but symptoms did not resolve with correction of hypoglycemia.  She was admitted for strokelike symptoms, received tPA, had an echocardiogram and carotid duplex ultrasound, both of which showed normal findings.  CT scan was normal.  Past Medical History:  Diagnosis Date   Anemia    Arthritis    COVID-19  11/19/2019   Diverticulitis    DM (diabetes mellitus) (HCC)    typ e 2   History of methicillin resistant staphylococcus aureus (MRSA) 2017   HTN (hypertension)    Hyperlipidemia    L-S radiculopathy 06/11/2015   Left leg swelling 01/22/2023   Lower abdominal pain 08/08/2022   Memory difficulties 09/01/2015   Multiple thyroid nodules    Myocardial infarction [facility]) 1995   Partial small bowel obstruction (HCC) 04/23/2016   SBO (small bowel obstruction) (HCC)    Short-term memory loss    Thyroid nodule    Past Surgical History:  Procedure Laterality Date   ABDOMINAL HYSTERECTOMY     due to endometriosis-1 ovary left   BREAST EXCISIONAL BIOPSY Right 1971   neg   CARDIAC CATHETERIZATION  2000   no significant CAD per note of Dr Graciela Husbands in 2013    CHOLECYSTECTOMY     CYSTOCELE REPAIR N/A 03/19/2017   Procedure: ANTERIOR REPAIR (CYSTOCELE);  Surgeon: Alfredo Martinez, MD;  Location: [facility];  Service: Urology;  Laterality: N/A;   CYSTOSCOPY N/A 03/19/2017   Procedure: CYSTOSCOPY;  Surgeon: Alfredo Martinez, MD;  Location: [facility];  Service: Urology;  Laterality: N/A;   ESOPHAGOGASTRODUODENOSCOPY (EGD) WITH PROPOFOL N/A 12/15/2021   Procedure: ESOPHAGOGASTRODUODENOSCOPY (EGD) WITH PROPOFOL;  Surgeon: Regis Bill, MD;  Location: [facility];  Service: Endoscopy;  Laterality: N/A;   EYE SURGERY     FOOT SURGERY     HAND SURGERY     HEMICOLECTOMY     INSERTION OF MESH N/A 08/21/2016   Procedure: INSERTION OF  MESH;  Surgeon: Glenna Fellows, MD;  Location: [facility];  Service: General;  Laterality: N/A;   INSERTION OF MESH N/A 12/08/2019   Procedure: INSERTION OF MESH;  Surgeon: Leafy Ro, MD;  Location: [facility];  Service: General;  Laterality: N/A;   LAPAROSCOPIC LYSIS OF ADHESIONS N/A 08/21/2016   Procedure: LAPAROSCOPIC LYSIS OF ADHESIONS;  Surgeon: Glenna Fellows, MD;  Location: [facility];  Service: General;  Laterality: N/A;   LAPAROSCOPIC REMOVAL ABDOMINAL MASS      LAPAROTOMY N/A 04/16/2019   Procedure: EXPLORATORY LAPAROTOMY;  Surgeon: Sung Amabile, DO;  Location: [facility];  Service: General;  Laterality: N/A;   LOOP RECORDER INSERTION N/A 03/21/2018   Procedure: LOOP RECORDER INSERTION;  Surgeon: Thurmon Fair, MD;  Location: [facility];  Service: Cardiovascular;  Laterality: N/A;   LUMBAR DISC SURGERY     ROUX-EN-Y GASTRIC BYPASS  2010   Dr Daphine Deutscher   SMALL INTESTINE SURGERY     TONSILLECTOMY     UPPER GI ENDOSCOPY  01/12/2016   normal larynx, normal esophagus. gastric bypass with a normal-sized pouch and intact staple line, normal examined jejunum, otherwise exam was normal   VENTRAL HERNIA REPAIR N/A 08/21/2016   Procedure: LAPAROSCOPIC VENTRAL HERNIA;  Surgeon: Glenna Fellows, MD;  Location: [facility];  Service: General;  Laterality: N/A;   XI ROBOTIC ASSISTED VENTRAL HERNIA N/A 12/08/2019   Procedure: XI ROBOTIC ASSISTED VENTRAL HERNIA;  Surgeon: Leafy Ro, MD;  Location: [facility];  Service: General;  Laterality: N/A;     Current Meds  Medication Sig   ACCU-CHEK GUIDE test strip USE TO CHECK BLOOD SUGAR UP TO 4 TIMES A DAY AS DIRECTED   Accu-Chek Softclix Lancets lancets SMARTSIG:Topical 1-4 Times Daily   atorvastatin (LIPITOR) 40 MG tablet Take 1 tablet by mouth every morning.   blood glucose meter kit and supplies Dispense Accu-Chek Guide. Use up to four times daily as directed. 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   calcitRIOL (ROCALTROL) 0.25 MCG capsule Take 0.25 mcg by mouth daily.   diazoxide (PROGLYCEM) 50 MG/ML suspension take 1ml (50mg  total) BY MOUTH EVERY 8 HOURS   gabapentin (NEURONTIN) 300 MG capsule TAKE 1 CAPSULE BY MOUTH 3 TIMES DAILY   hydrOXYzine (VISTARIL) 100 MG capsule TAKE 1 CAPSULE BY MOUTH 3 TIMES DAILY   ibuprofen (ADVIL) 600 MG tablet Take 600 mg by mouth every 6 (six) hours as needed.   levETIRAcetam (KEPPRA) 500 MG tablet Take 1 tablet (500 mg total) by mouth 2 (two) times daily.   LINZESS 290 MCG CAPS  capsule TAKE ONE CAPSULE BY MOUTH BEFORE BREAKFAST daily   metoprolol succinate (TOPROL-XL) 25 MG 24 hr tablet Take 1 tablet (25 mg total) by mouth every morning.   mirtazapine (REMERON SOL-TAB) 15 MG disintegrating tablet TAKE 1 TABLET BY MOUTH EVERYDAY AT BEDTIME   omeprazole (PRILOSEC) 20 MG capsule TAKE 1 CAPSULE BY MOUTH ONCE DAILY   ondansetron (ZOFRAN-ODT) 4 MG disintegrating tablet Take 4 mg by mouth every 8 (eight) hours as needed.   rivastigmine (EXELON) 3 MG capsule Take 3 mg by mouth 2 (two) times daily.   rosuvastatin (CRESTOR) 40 MG tablet Take 1 tablet (40 mg total) by mouth daily.   traZODone (DESYREL) 50 MG tablet Take 1 tablet by mouth at bedtime.   triamcinolone cream (KENALOG) 0.5 % Apply 1 Application topically 3 (three) times daily as needed (flares).     Allergies:   Lotrel [amlodipine besy-benazepril hcl], Contrast media [iodinated contrast media], Niacin, and  Sulfa antibiotics   Social History   Tobacco Use   Smoking status: Never   Smokeless tobacco: Never  Vaping Use   Vaping status: Never Used  Substance Use Topics   Alcohol use: No   Drug use: No     Family Hx: The patient's family history includes Breast cancer in her paternal grandmother; Breast cancer (age of onset: 61) in her paternal aunt; Breast cancer (age of onset: 36) in her paternal aunt; COPD in her mother; Cancer in her paternal aunt and paternal grandmother; Cancer (age of onset: 23) in her father; Congestive Heart Failure in her maternal grandmother; Coronary artery disease (age of onset: 32) in her father; Diabetes in her mother; Emphysema in her maternal grandfather; Heart attack in her paternal grandfather; Hypertension in her mother.  ROS:   Please see the history of present illness.    All other systems are reviewed and are negative.   Prior CV studies:   The following studies were reviewed today: Labs from PCP  Labs/Other Tests and Data Reviewed:    EKG: Ordered 11/19/2022 and  personally viewed shows sinus bradycardia but is otherwise a completely normal tracing.  QTc 460 ms.   EKG Interpretation Date/Time:    Ventricular Rate:    PR Interval:    QRS Duration:    QT Interval:    QTC Calculation:   R Axis:      Text Interpretation:           ECHO 03/19/2018 - Left ventricle: The cavity size was normal. Wall thickness was   normal. Systolic function was normal. The estimated ejection   fraction was in the range of 60% to 65%. Wall motion was normal;   there were no regional wall motion abnormalities.  ECHO 10/27/2018 NORMAL LEFT VENTRICULAR SYSTOLIC FUNCTION WITH AN ESTIMATED EF = 55 %  NORMAL RIGHT VENTRICULAR SYSTOLIC FUNCTION  MILD-TO-MODERATE TRICUSPID VALVE INSUFFICIENCY  MILD MITRAL VALVE INSUFFICIENCY  NO VALVULAR STENOSIS  MILD LA ENLARGEMENT   ECHO 09/06/2021  1. Left ventricular ejection fraction, by estimation, is 55 to 60%. The  left ventricle has normal function. The left ventricle has no regional  wall motion abnormalities. Left ventricular diastolic parameters were  normal.   2. Right ventricular systolic function is normal. The right ventricular  size is normal.   3. The mitral valve is normal in structure. No evidence of mitral valve  regurgitation.   4. The aortic valve was not well visualized. Aortic valve regurgitation  is not visualized.   5. The inferior vena cava is normal in size with <50% respiratory  variability, suggesting right atrial pressure of 8 mmHg.   Nuclear stress test 10/28/2018 Impression Normal myocardial perfusion scan no evidence of stress-induced  myocardial ischemia ejection fraction of 59% conclusion negative scan  Carotid US 09/06/2021 IMPRESSION: Minor carotid atherosclerosis. Negative for stenosis. Degree of narrowing less than 50% bilaterally by ultrasound criteria.   Patent antegrade vertebral flow bilaterally    Recent Labs: 06/19/2023: ALT 53; BUN 11; Creatinine, Ser 1.35; Magnesium 2.2;  Potassium 3.9; Sodium 141 01/17/2024: Hemoglobin 11.0; Platelets 295   Recent Lipid Panel Lab Results  Component Value Date/Time   CHOL 134 09/06/2021 04:43 AM   CHOL 163 12/05/2020 08:55 AM   CHOL 154 01/18/2014 06:32 AM   TRIG 54 09/06/2021 04:43 AM   TRIG 53 01/18/2014 06:32 AM   HDL 60 09/06/2021 04:43 AM   HDL 61 12/05/2020 08:55 AM   HDL 74 (H) 01/18/2014 06:32 AM  CHOLHDL 2.2 09/06/2021 04:43 AM   LDLCALC 63 09/06/2021 04:43 AM   LDLCALC 86 12/05/2020 08:55 AM   LDLCALC 69 01/18/2014 06:32 AM    Wt Readings from Last 3 Encounters:  01/20/24 146 lb 3.2 oz (66.3 kg)  01/10/24 145 lb 8 oz (66 kg)  09/10/23 139 lb (63 kg)     Objective:    Vital Signs:  BP (!) 144/78 (BP Location: Left Arm, Patient Position: Sitting, Cuff Size: Normal)   Pulse (!) 50   Ht 5\' 3"  (1.6 m)   Wt 146 lb 3.2 oz (66.3 kg)   BMI 25.90 kg/m     General: Alert, oriented x3, no distress, very lean. Head: no evidence of trauma, PERRL, EOMI, no exophtalmos or lid lag, no myxedema, no xanthelasma; normal ears, nose and oropharynx Neck: normal jugular venous pulsations and no hepatojugular reflux; brisk carotid pulses without delay and no carotid bruits Chest: clear to auscultation, no signs of consolidation by percussion or palpation, normal fremitus, symmetrical and full respiratory excursions Cardiovascular: normal position and quality of the apical impulse, regular rhythm, normal first and second heart sounds, no murmurs, rubs or gallops Abdomen: no tenderness or distention, no masses by palpation, no abnormal pulsatility or arterial bruits, normal bowel sounds, no hepatosplenomegaly Extremities: no clubbing, cyanosis or edema; 2+ radial, ulnar and brachial pulses bilaterally; 2+ right femoral, posterior tibial and dorsalis pedis pulses; 2+ left femoral, posterior tibial and dorsalis pedis pulses; no subclavian or femoral bruits Neurological: grossly nonfocal Psych: Normal mood and  affect   ASSESSMENT & PLAN:    1. History of syncope   2. Benign essential HTN   3. Encounter for loop recorder at end of battery life   4. Hypercholesterolemia   5. Stage 3b chronic kidney disease (HCC)   6. Type 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, without long-term current use of insulin (HCC)        Syncope: No recurrent episodes during 3 years of l implantable oop recorder monitoring (device at end of service).  Suspect the initial event may have been due to hypotension. HTN: Good control.  Symptoms of orthostatic hypotension resolved after stopping her diuretic. ILR: Device no longer working/battery end of service. HLP: All lipid parameters are excellent. CKD 3: Most recent creatinine at baseline 1.46  DM: Very well controlled.  Probably can stop metformin altogether in my opinion. Chest pain: Symptoms have features strongly suggestive of musculoskeletal etiology.     Medication Adjustments/Labs and Tests Ordered: Current medicines are reviewed at length with the patient today.  Concerns regarding medicines are outlined above.   There are no Patient Instructions on file for this visit.   Tests Ordered: Orders Placed This Encounter  Procedures   EKG 12-Lead   Follow Up:  Virtual Visit or In Person  1 year  Signed, Thurmon Fair, MD  01/20/2024 10:13 AM    [facility]

## 2024-01-20 ENCOUNTER — Encounter: Payer: Self-pay | Admitting: Family Medicine

## 2024-01-20 ENCOUNTER — Ambulatory Visit: Payer: 59 | Attending: Cardiovascular Disease | Admitting: Cardiovascular Disease

## 2024-01-20 ENCOUNTER — Encounter: Payer: Self-pay | Admitting: Cardiovascular Disease

## 2024-01-20 ENCOUNTER — Ambulatory Visit: Payer: 59 | Admitting: Pain Medicine

## 2024-01-20 VITALS — BP 136/69 | HR 50 | Ht 63.0 in | Wt 146.2 lb

## 2024-01-20 DIAGNOSIS — Z87898 Personal history of other specified conditions: Secondary | ICD-10-CM | POA: Diagnosis not present

## 2024-01-20 DIAGNOSIS — Z4509 Encounter for adjustment and management of other cardiac device: Secondary | ICD-10-CM

## 2024-01-20 DIAGNOSIS — E7800 Pure hypercholesterolemia, unspecified: Secondary | ICD-10-CM | POA: Diagnosis not present

## 2024-01-20 DIAGNOSIS — E1122 Type 2 diabetes mellitus with diabetic chronic kidney disease: Secondary | ICD-10-CM

## 2024-01-20 DIAGNOSIS — I10 Essential (primary) hypertension: Secondary | ICD-10-CM

## 2024-01-20 DIAGNOSIS — N1832 Chronic kidney disease, stage 3b: Secondary | ICD-10-CM | POA: Diagnosis not present

## 2024-01-20 DIAGNOSIS — R072 Precordial pain: Secondary | ICD-10-CM | POA: Diagnosis not present

## 2024-01-20 MED ORDER — PREDNISONE 50 MG PO TABS: ORAL_TABLET | ORAL | 0 refills | Status: DC

## 2024-01-20 NOTE — Patient Instructions (Addendum)
Medication Instructions:    No  changes  *If you need a refill on your cardiac medications before your next appointment, please call your pharmacy*   Lab Work: BMP  today   If you have labs (blood work) drawn today and your tests are completely normal, you will receive your results only by: MyChart Message (if you have MyChart) OR A paper copy in the mail If you have any lab test that is abnormal or we need to change your treatment, we will call you to review the results.   Testing/Procedures: Will be schedule at [facility] in Radiology dept  Your physician has requested that you have coronary  CTA. Coronary computed tomography (CT)angiogram  is a special type of CT scan that uses a computer to produce multi-dimensional views of major blood vessels throughout the heart.  CT angiography, a contrast material is injected through an IV to help visualize the blood vessels  a painless test that uses an x-ray machine to take clear, detailed pictures of your heart arteries .  Please follow instruction sheet as given.    Follow-Up: At [facility], you and your health needs are our priority.  As part of our continuing mission to provide you with exceptional heart care, we have created designated Provider Care Teams.  These Care Teams include your primary Cardiologist (physician) and Advanced Practice Providers (APPs -  Physician Assistants and Nurse Practitioners) who all work together to provide you with the care you need, when you need it.     Your next appointment:   2 month(s)  The format for your next appointment:   In Person  Provider:   Thurmon Fair, MD    Other Instructions     Your cardiac CT will be scheduled at one of the below locations:   [facility] [address] [place], [place] [address] [phone]  OR  [facility] [address] [place], [place] [address] [phone]  OR   [facility] [address] [place], [place] [address] [phone]   Please arrive at the Women's and Children's Entrance (Entrance C2) of [facility] 30 minutes prior to test start time. You can use the FREE valet parking offered at entrance C (encouraged to control the heart rate for the test)  Proceed to the [facility] Radiology Department (first floor) to check-in and test prep.  All radiology patients and guests should use entrance C2 at [facility], accessed from [facility], even though the hospital's physical address listed is [address]   If scheduled at [facility] or [facility], please arrive 15 mins early for check-in and test prep.  There is spacious parking and easy access to the radiology department from the [facility] Heart and Vascular entrance. Please enter here and check-in with the desk attendant.    Please follow these instructions carefully (unless otherwise directed):  An IV will be required for this test and Nitroglycerin will be given.    On the Night Before the Test: Be sure to Drink plenty of water. Do not consume any caffeinated/decaffeinated beverages or chocolate 12 hours prior to your test. Do not take any antihistamines 12 hours prior to your test. If the patient has contrast allergy: Patient will need a prescription for Prednisone and very clear instructions (as follows): Prednisone 50 mg - take 13 hours prior to test Take another Prednisone 50 mg  7 hours prior to test Take another Prednisone 50 mg 1 hour prior to test Take Benadryl 50 mg 1 hour prior to test Patient must complete all four doses of above prophylactic medications. Patient will need a ride after test due to Benadryl.  On the Day of the Test: Drink plenty of water until 1 hour prior to the test. Do not eat any food 1 hour prior to test. You may take your regular medications prior to  the test. ( Including regular dose of Metoprolol)  Patients who wear a continuous glucose monitor MUST remove the device prior to scanning. FEMALES- please wear underwire-free bra if available, avoid dresses & tight clothing   After the Test: Drink plenty of water. After receiving IV contrast, you may experience a mild flushed feeling. This is normal. On occasion, you may experience a mild rash up to 24 hours after the test. This is not dangerous. If this occurs, you can take Benadryl 25 mg, Zyrtec, Claritin, or Allegra and increase your fluid intake. (Patients taking Tikosyn should avoid Benadryl, and may take Zyrtec, Claritin, or Allegra) If you experience trouble breathing, this can be serious. If it is severe call 911 IMMEDIATELY. If it is mild, please call our office.  We will call to schedule your test 2-4 weeks out understanding that some insurance companies will need an authorization prior to the service being performed.   For more information and frequently asked questions, please visit our website : [email or web address]  For non-scheduling related questions, please contact the cardiac imaging nurse navigator should you have any questions/concerns: Cardiac Imaging Nurse Navigators Direct Office Dial: [phone]   For scheduling needs, including cancellations and rescheduling, please call [place], [phone].

## 2024-01-21 NOTE — Progress Notes (Signed)
 PROVIDER NOTE: Information contained herein reflects review and annotations entered in association with encounter. Interpretation of such information and data should be left to medically-trained personnel. Information provided to patient can be located elsewhere in the medical record under Patient Instructions. Document created using STT-dictation technology, any transcriptional errors that may result from process are unintentional.    Patient: Julie Acevedo  Service Category: E/M  Provider: Eric DELENA Como, MD  DOB: September 28, 1951  DOS: 01/22/2024  Referring Provider: Sharma Bullocks*  MRN: [id number]  Specialty: Interventional Pain Management  PCP: Sharma Coyer, MD  Type: Established Patient  Setting: Ambulatory outpatient    Location: Office  Delivery: Face-to-face     HPI  Julie Acevedo, a 73 y.o. year old female, is here today because of her Acute exacerbation of chronic low back pain [M54.50, G89.29]. [name] primary complain today is Back Pain and Knee Pain (Left )  Pertinent problems: Ms. Stavola has Arthritis; Narrowing of intervertebral disc space; Chronic low back pain of over 3 months duration; Lumbosacral radiculopathy at S1 (Left); Arthritis of knee, degenerative; Failed back surgical syndrome; Acquired trigger finger; Low back pain radiating to lower extremity (Right); PTTD (posterior tibial tendon dysfunction); Chronic pain syndrome; Abnormal CT scan, cervical spine (11/19/2022); Abnormal MRI, lumbar spine (09/25/2023); DDD (degenerative disc disease), cervical; DDD (degenerative disc disease), lumbar; Cervical foraminal stenosis (C3-C7); Chronic low back pain (1ry area of Pain) (Bilateral) (R>L) w/o sciatica; Chronic lower extremity pain (2ry area of Pain) (Right); Osteoarthritis of shoulder (Right); Osteoarthritis of knees (Bilateral); Chronic hip pain (3ry area of Pain) (Right); Lumbar facet joint pain; Lumbar facet joint syndrome; Arthropathy of hip (Right);  Osteoarthritis of hip (Right); 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 Pain Assessment: Severity of Chronic pain is reported as a 9 /10. Location: Knee Left/Pain additionally in the lower mid back bilateral to buttocks bilateral. Knee pain does radaite.. Onset: More than a month ago. Quality: Constant, Aching, Dull. Timing: Constant. Modifying factor(s): Denies. Vitals:  height is 5' 3 (1.6 m) and weight is 145 lb (65.8 kg). Her temporal temperature is 97.5 F (36.4 C) (abnormal). Her blood pressure is 147/66 (abnormal) and her pulse is 57 (abnormal). Her respiration is 18 and oxygen saturation is 100%.  BMI: Estimated body mass index is 25.69 kg/m as calculated from the following:   Height as of this encounter: 5' 3 (1.6 m).   Weight as of this encounter: 145 lb (65.8 kg). Last encounter: 09/26/2023. Last procedure: 09/10/2023.  Reason for encounter: post-procedure evaluation and assessment.  On 09/10/2023 the patient had a diagnostic bilateral lumbar facet block performed.  She was asked to return 2 weeks later for evaluation.  She was no-show to the 09/26/2023 postprocedure evaluation visit.  Because more than 2 weeks have passed since the procedure, with consider the information provided by the patient today regarding the results as being less than accurate.  Discussed the use of AI scribe software for clinical note transcription with the patient, who gave verbal consent to proceed.  History of Present Illness   Julie Acevedo is a 73 year old female  with chronic back pain and urinary issues  who presents for follow-up on lumbar facet blocks and ongoing pain management.  She missed her follow-up appointment in October after receiving lumbar facet blocks on both sides. Immediately after the injection, while the numbing medicine was still in place, she had no pain, indicating the location of her pain was accurately targeted. She experienced about 80% improvement in her back pain for approximately a month. However, the pain has since returned.  She has pain in the bottom of her left foot that radiates from her back down to her foot, with no pain, numbness, or weakness in her right knee and foot.  Imaging studies indicate chronic arachnoiditis, which may contribute to her bladder issues and other symptoms from the waist down, including pain in the hips, knees, and feet.  She has a history of urinary bladder issues and has seen a urologist who conducted nerve conduction tests. She is on medication for her kidney function, which was affected. She has diabetes with a component of chronic kidney disease. 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 Her bladder failed again after the tumor removal.  She has pain in her left knee, which 'pops' when she walks.      Post-procedure evaluation   Procedure: Lumbar Facet, Medial Branch Block(s) #1 (Bilateral L4, L5 Pedicle screw injection.) Laterality: Bilateral  Level: L2, L3, L4, L5, and S1 Medial Branch Level(s). Injecting these levels blocks the L3-4, L4-5, and L5-S1 lumbar facet joints. Imaging: Fluoroscopic guidance Spinal ([id number]) Anesthesia: Local anesthesia (1-2% Lidocaine ) Anxiolysis: IV Versed  2.0 mg + Glycopyrrolate  0.2mg   Sedation: Moderate Sedation None required. No Fentanyl  administered.         DOS: 09/10/2023 Performed by: Eric DELENA Como, MD  Primary Purpose: Diagnostic/Therapeutic Indications: Low back pain severe enough  to impact quality of life or function. 1. 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 Chronic low back pain (1ry area of Pain) (Bilateral) (R>L) w/o sciatica   3. Chronic low back pain of over 3 months duration   4. Lumbar facet arthropathy   5. Lumbar facet joint pain   6. Lumbar facet joint syndrome   7. Spondylosis without myelopathy or radiculopathy, lumbosacral region   8. Lumbar facet hypertrophy (Multilevel) (Bilateral)   9. Failed back surgical syndrome   10. Low back pain radiating to lower extremity (Right)   11. History of allergy to radiographic contrast media    NAS-11 Pain score:   Pre-procedure: 8 /10   Post-procedure: 0-No pain/10   Note: Referred feeling bad upon arrived POC dextro was 60 (hypoglycemic). D5W given and improved.     Effectiveness:  Initial hour after procedure: 100 %. Subsequent 4-6 hours post-procedure: 100 %. Analgesia past initial 6 hours: 0 % (80% relief for 1 month and pain gradually returned). Ongoing improvement:  Analgesic: The patient indicates having attained 100% relief of the pain for the duration of the local anesthetic which then went down to an 80% improvement of the low back pain that lasted for 1 month. Function: Back to baseline ROM: Back to baseline  Pharmacotherapy Assessment  Analgesic: No chronic opioid analgesics therapy prescribed by our practice. None MME/day: 0 mg/day   Monitoring: [place] PMP: PDMP reviewed during this encounter.       Pharmacotherapy: No side-effects or adverse reactions reported. Compliance: No problems identified. Effectiveness: Clinically acceptable.  Bonner Norris, RN  01/22/2024  8:00 AM  Sign when Signing Visit Nursing Pain Medication  Assessment:  Safety precautions to be maintained throughout the outpatient stay will include: orient to surroundings, keep bed in low position, maintain call bell within reach at all times, provide assistance with transfer out of bed and ambulation.       No results found for: CBDTHCR No results found for: D8THCCBX No results found for: D9THCCBX  UDS:  Summary  Date Value Ref Range Status  06/19/2023 Note  Final    Comment:    ==================================================================== Compliance Drug Analysis, Ur ==================================================================== Test                             Result       Flag       Units  Drug Present and Declared for Prescription Verification   Gabapentin                      PRESENT      EXPECTED   Mirtazapine                     PRESENT      EXPECTED   Hydroxyzine                     PRESENT      EXPECTED   Metoprolol                      PRESENT      EXPECTED  Drug Absent but Declared for Prescription Verification   Levetiracetam                   Not Detected UNEXPECTED ==================================================================== Test                      Result    Flag   Units      Ref Range   Creatinine              60               mg/dL      >=79 ==================================================================== Declared Medications:  The flagging and interpretation on this report are based on the  following declared medications.  Unexpected results may arise from  inaccuracies in the declared medications.   **Note: The testing scope of this panel includes these medications:   Gabapentin  (Neurontin )  Hydroxyzine   Levetiracetam  (Keppra )  Metoprolol   Mirtazapine  (Remeron )   **Note: The testing scope of this panel does not include the  following reported medications:   Atorvastatin   Calcitriol   Diazoxide  (Proglycem )  Linaclotide  (Linzess )  Rosuvastatin  (Crestor )  Triamcinolone  (Kenalog ) ==================================================================== For clinical consultation, please call [phone]. ====================================================================       ROS  Constitutional: Denies any fever or  chills Gastrointestinal: No reported hemesis, hematochezia, vomiting, or acute GI distress Musculoskeletal: Denies any acute onset joint swelling, redness, loss of ROM, or weakness Neurological: No reported episodes of acute onset apraxia, aphasia, dysarthria, agnosia, amnesia, paralysis, loss of coordination, or loss of consciousness  Medication Review  Accu-Chek Softclix Lancets, atorvastatin , blood glucose meter kit and supplies, calcitRIOL , diazoxide , diphenhydrAMINE , gabapentin , glucose blood, hydrOXYzine , ibuprofen, levETIRAcetam , linaclotide , metoprolol  succinate, mirtazapine , omeprazole , ondansetron , predniSONE , rivastigmine , rosuvastatin , traZODone , and triamcinolone  cream  History Review  Allergy: Julie Acevedo is allergic to lotrel [amlodipine  besy-benazepril hcl], contrast media [iodinated contrast media], niacin, and sulfa antibiotics. Drug: Julie Acevedo  reports no history of drug use. Alcohol:  reports no history of alcohol use. Tobacco:  reports that she has never smoked. She has never used smokeless tobacco. Social: Julie Acevedo  reports that she has never smoked. She has never used smokeless tobacco. She reports that she does not drink alcohol and does not use drugs. Medical:  has a past medical history of Anemia, Arthritis, COVID-19 (11/19/2019), Diverticulitis, DM (diabetes mellitus) (HCC), History of methicillin resistant staphylococcus aureus (MRSA) (2017), HTN (hypertension), Hyperlipidemia, L-S radiculopathy (06/11/2015), Left leg swelling (01/22/2023), Lower abdominal pain (08/08/2022), Memory difficulties (09/01/2015), Multiple thyroid  nodules, Myocardial infarction (HCC) (1995), Partial small bowel obstruction (HCC) (04/23/2016), SBO (small bowel obstruction) (HCC), Short-term memory loss, and Thyroid  nodule. Surgical: Julie Acevedo  has a past surgical history that includes Hemicolectomy; Lumbar disc surgery; Roux-en-Y Gastric Bypass (2010); Cholecystectomy; Eye surgery; Hand  surgery; Foot surgery; Abdominal hysterectomy; Tonsillectomy; Upper gi endoscopy (01/12/2016); Cardiac catheterization (2000); Laparoscopic lysis of adhesions (N/A, 08/21/2016); Ventral hernia repair (N/A, 08/21/2016); Insertion of mesh (N/A, 08/21/2016); Cystocele repair (N/A, 03/19/2017); Cystoscopy (N/A, 03/19/2017); Laparoscopic removal abdominal mass; LOOP RECORDER INSERTION (N/A, 03/21/2018); laparotomy (N/A, 04/16/2019); XI robotic assisted ventral hernia (N/A, 12/08/2019); Insertion of mesh (N/A, 12/08/2019); Breast excisional biopsy (Right, 1971); Small intestine surgery; and Esophagogastroduodenoscopy (egd) with propofol  (N/A, 12/15/2021). Family: family history includes Breast cancer in her paternal grandmother; Breast cancer (age of onset: 42) in her paternal aunt; Breast cancer (age of onset: 68) in her paternal aunt; COPD in her mother; Cancer in her paternal aunt and paternal grandmother; Cancer (age of onset: 77) in her father; Congestive Heart Failure in her maternal grandmother; Coronary artery disease (age of onset: 75) in her father; Diabetes in her mother; Emphysema in her maternal grandfather; Heart attack in her paternal grandfather; Hypertension in her mother.  Laboratory Chemistry Profile   Renal Lab Results  Component Value Date   BUN 11 06/19/2023   CREATININE 1.35 (H) 06/19/2023   BCR 9 (L) 09/13/2021   GFRAA 42 (L) 12/05/2020   GFRNONAA 42 (L) 06/19/2023    Hepatic Lab Results  Component Value Date   AST 61 (H) 06/19/2023   ALT 53 (H) 06/19/2023   ALBUMIN 4.0 06/19/2023   ALKPHOS 209 (H) 06/19/2023   LIPASE 27 08/31/2022   AMMONIA 68 (H) 11/15/2012    Electrolytes Lab Results  Component Value Date   NA 141 06/19/2023   K 3.9 06/19/2023   CL 105 06/19/2023   CALCIUM  8.9 06/19/2023   MG 2.2 06/19/2023   PHOS 3.4 11/20/2022    Bone Lab Results  Component Value Date   VD25OH 51.23 08/09/2022   25OHVITD1 39 06/19/2023   25OHVITD2 1.0 06/19/2023    25OHVITD3 38 06/19/2023    Inflammation (CRP: Acute Phase) (ESR: Chronic Phase) Lab Results  Component Value Date   CRP <0.5 06/19/2023   ESRSEDRATE 40 (H) 06/19/2023   LATICACIDVEN 1.1 08/21/2022         Note: Above Lab results reviewed.  Recent Imaging Review  US  Soft Tissue Head/Neck (NON-THYROID ) CLINICAL DATA:  Palpable tender left cervical lymph nodes.  EXAM: ULTRASOUND OF HEAD/NECK SOFT TISSUES  TECHNIQUE: Ultrasound examination of the head and neck soft tissues was performed in the area of clinical concern.  COMPARISON:  None Available.  FINDINGS: Sonographic evaluation of the patient's palpable area of concern involving the lateral aspect of the left side of the neck correlates with a mildly prominent though non pathologically enlarged left cervical lymph node. The lymph node is not enlarged by size criteria measuring 0.6 cm in greatest short axis diameter. No evidence of suppuration.  Otherwise, there is no sonographic correlate for patient's palpable area of concern.  Sonographic evaluation of the right-side of the neck, scanned for comparison purposes, is also without sonographic abnormality.  IMPRESSION: Sonographic evaluation of the patient's palpable area of concern involving the lateral aspect of the left side of the neck correlates with a mildly prominent though non pathologically enlarged left cervical lymph node, presumably reactive in etiology.  Electronically Signed   By: Norleen Roulette M.D.   On: 01/18/2024 13:43 Note: Reviewed        Physical Exam  General appearance: Well nourished, well developed, and well hydrated. In no apparent acute distress Mental status: Alert, oriented x 3 (person, place, & time)       Respiratory: No evidence of acute respiratory distress Eyes: PERLA Vitals: BP (!) 147/66 (Cuff Size: Normal)   Pulse (!) 57   Temp (!) 97.5 F (36.4 C) (Temporal)   Resp 18   Ht 5' 3 (1.6 m)   Wt 145 lb (65.8 kg)   SpO2 100%    BMI 25.69 kg/m  BMI: Estimated body mass index is 25.69 kg/m as calculated from the following:   Height as of this encounter: 5' 3 (1.6 m).   Weight as of this encounter: 145 lb (65.8 kg). Ideal: Ideal body weight: 52.4 kg (115 lb 8.3 oz) Adjusted ideal body weight: 57.7 kg (127 lb 5 oz)  Assessment   Diagnosis Status  1. Acute exacerbation of chronic low back pain   2. Chronic knee pain (Left)   3. Radicular pain of lower extremity (Left)   4. Chronic low back pain (1ry area of Pain) (Bilateral) (R>L) w/o sciatica   5. Lumbosacral radiculopathy at S1 (Left)   6. Lumbar radiculopathy (S1) (Left)   7. Chronic low back pain of over 3 months duration   8. Failed back surgical syndrome   9. Chronic Arachnoiditis of spine   10. 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 Lumbar facet joint pain   12. Lumbar facet joint syndrome   13. Abnormal MRI, lumbar spine (09/25/2023)   14. History of allergy to radiographic contrast media   15. Postop check    Having a Flare-up Worsening New   Updated Problems: Problem  Acute Exacerbation of Chronic Low Back Pain  Lumbar radiculopathy (S1) (Left)  Chronic knee pain (Left)  Radicular pain of lower extremity (Left)  Lumbosacral radiculopathy at S1 (Left)  History of Allergy to Radiographic Contrast Media  Type 2 Diabetes Mellitus With Stage 3b Chronic Kidney Disease, Without Long-Term Current Use of Insulin  (Hcc)   Formatting of this note might be different from the original. Overview: Monofilament intact 04/2012; urine microalbumin 50 01/2012 (anaphylaxis with ACE-I); opthalmology 2012 [facility]. Last Assessment & Plan: Formatting of this note might be different from the original. Per primary care physician  Monofilament intact 04/2012; urine microalbumin 50 01/2012 (anaphylaxis with ACE-I); opthalmology 2012 [facility].     Plan of Care  Problem-specific:  Assessment and Plan    Chronic Low Back Pain Chronic low back pain  persists despite temporary relief from lumbar facet blocks, indicating inflammation. Pain has returned, suggesting further intervention is needed. Radiofrequency ablation is discussed as a non-surgical option for longer-lasting relief, ranging from 3 to 18 months. Plan to repeat lumbar facet block injection and discuss radiofrequency ablation if pain recurs.  S1 Radiculopathy Pain radiates to the left foot, consistent with S1 nerve involvement, likely due to inflammation or nerve compression. A caudal  epidural steroid injection is planned to reduce inflammation and relieve pain.  Chronic Arachnoiditis Chronic arachnoiditis with cauda equina nerve clumping contributes to lower extremity symptoms and potential neurogenic bladder issues. Pain in hips, knees, and feet may be related. Symptoms will be monitored, and further imaging or referral will be considered if symptoms worsen.  Left Knee Pain Localized pain and popping in the left knee suggest possible mechanical issues or underlying pathology. An x-ray is ordered for evaluation.  Diabetes Mellitus with Chronic Kidney Disease Diabetes is associated with chronic kidney disease. Under urologist care for bladder issues, including tumor removal. Monitoring kidney function and continuing current management is essential.  General Health Maintenance Follow-up appointments are crucial for diagnostic and treatment efficacy. Timely intervention is necessary to prevent symptom exacerbation. Ensure timely follow-up appointments and educate on the importance of early intervention for pain management.  Follow-up Administer Toradol  and Robaxin  injection with an adjusted dose due to kidney issues. Schedule a follow-up appointment for treatment and evaluation.       Ms. SHERITTA DEEG has a current medication list which includes the following long-term medication(s): accu-chek guide, diphenhydramine , gabapentin , levetiracetam , linzess , metoprolol  succinate,  mirtazapine , omeprazole , rosuvastatin , and mirtazapine .  Pharmacotherapy (Medications Ordered): Meds ordered this encounter  Medications   ketorolac  (TORADOL ) 30 MG/ML injection 30 mg   methocarbamol  (ROBAXIN ) injection 200 mg   predniSONE  (DELTASONE ) 50 MG tablet    Sig: Take 1 tab PO 13 hrs, 7 hrs, and 1 hr before contrast media injection (procedure).    Dispense:  10 tablet    Refill:  0    To be started the day before procedure (13 hours before).   diphenhydrAMINE  (BENADRYL ) 50 MG capsule    Sig: Take 50 mg by mouth 1 hour before procedure. Must have a driver.    Dispense:  10 capsule    Refill:  0    To be taken with last dose of steroid, 1 hour before procedure.   Orders:  Orders Placed This Encounter  Procedures   Caudal Epidural Injection    Standing Status:   Future    Expiration Date:   04/20/2024    Scheduling Instructions:     Laterality: Left-sided     Level(s): Sacrococcygeal canal (Tailbone area)     Sedation: With Sedation     Scheduling Timeframe: As soon as pre-approved    Where will this procedure be performed?:   [facility]   DG Knee Complete 4 Views Left    Standing Status:   Future    Expiration Date:   04/20/2024    Scheduling Instructions:     Please make sure that the patient understands that this needs to be done as soon as possible. Never have the patient do the imaging just before the next appointment. Inform patient that having the imaging done within the [facility] will expedite the availability of the results and will provide      imaging availability to the requesting physician. In addition inform the patient that the imaging order has an expiration date and will not be renewed if not done within the active period.    Reason for Exam (SYMPTOM  OR DIAGNOSIS REQUIRED):   Left knee pain/arthralgia    Preferred imaging location?:   [facility]    Release to patient:   Immediate    Call Results- Best Contact Number?:   [phone]  [facility]   Nursing Instructions:  Please complete this patient's postprocedure evaluation.    Scheduling Instructions:     Please complete this patient's postprocedure evaluation.   Informed Consent Details: Physician/Practitioner Attestation; Transcribe to consent form and obtain patient signature    Do not administer NSAIDs (Toradol , etc.) if patient has an allergy or intolerance to NSAIDs or if patient has CKD (chronic kidney disease or failure). Avoid the use of Muscle Relaxants (orphenedrine/Norflex, etc.) if patient has allergy or intolerance to this medication.    Scheduling Instructions:     Nursing orders: Complete pain questionnaire before administration of therapy. Document location, laterality, onset, level, trigger, and current medications taken for the pain.    Physician/Practitioner attestation of informed consent for procedure/surgical case:   I, the physician/practitioner, attest that I have discussed with the patient the benefits, risks, side effects, alternatives, likelihood of achieving goals and potential problems during recovery for the procedure that I have provided informed consent.    Procedure:   IM injection of therapeutic substance    Physician/Practitioner performing the procedure:   Bertine Schlottman A. Tanya, MD    Indication/Reason:   Acute on chronic pain   Follow-up plan:   Return for (ECT): (L) Caudal ESI #1.      Interventional Therapies  Risk Factors  Considerations:  Cardiologist: Cara Lovelace, MD ([facility])  Neurologist: Jannett Fairly, MD [facility]) Hx of MI (1995)  Hx of TIAs  T2DM  Stage 3b CKD  [name]  Memory impairment  Hx of Bowel Obstruction (04/2019)  GERD  s/p Gastric Bypass for obesity  Malnutrition-malabsorption  Hx. Seizures      Planned  Pending:   Diagnostic left caudal ESI #1 (contrast allergy protocol ordered)    Under consideration:   Diagnostic  left caudal ESI #1  Diagnostic bilateral lumbar facet MBB #2  Diagnostic/therapeutic right IA hip joint injection #1    Completed:   Diagnostic bilateral lumbar facet MBB x1 (09/10/2023) (100/100/80 x 1 month)    Therapeutic  Palliative (PRN) options:   None established   Completed by other providers:   Therapeutic/diagnostic right L5 TFESI x2 (04/11/2016, 05/02/2016) by Morene Falcon, DO [facility])  Therapeutic/diagnostic right S1 TFESI x2 (04/11/2016, 05/02/2016) by Morene Falcon, DO [facility])  Therapeutic/diagnostic right L3 TFESI x1 (07/19/2016) by Morene Falcon, DO [facility])      Recent Visits No visits were found meeting these conditions. Showing recent visits within past 90 days and meeting all other requirements Today's Visits Date Type Provider Dept  01/22/24 Office Visit Tanya Glisson, MD [facility]  Showing today's visits and meeting all other requirements Future Appointments No visits were found meeting these conditions. Showing future appointments within next 90 days and meeting all other requirements  I discussed the assessment and treatment plan with the patient. The patient was provided an opportunity to ask questions and all were answered. The patient agreed with the plan and demonstrated an understanding of the instructions.  Patient advised to call back or seek an in-person evaluation if the symptoms or condition worsens.  Duration of encounter: 32 minutes.  Total time on encounter, as per AMA guidelines included both the face-to-face and non-face-to-face time personally spent by the physician and/or other qualified health care professional(s) on the day of the encounter (includes time in activities that require the physician or other qualified health care professional and does not include time in activities normally performed by clinical staff). Physician's time may include the following activities when performed: Preparing to see  the patient (e.g.,  pre-charting review of records, searching for previously ordered imaging, lab work, and nerve conduction tests) Review of prior analgesic pharmacotherapies. Reviewing PMP Interpreting ordered tests (e.g., lab work, imaging, nerve conduction tests) Performing post-procedure evaluations, including interpretation of diagnostic procedures Obtaining and/or reviewing separately obtained history Performing a medically appropriate examination and/or evaluation Counseling and educating the patient/family/caregiver Ordering medications, tests, or procedures Referring and communicating with other health care professionals (when not separately reported) Documenting clinical information in the electronic or other health record Independently interpreting results (not separately reported) and communicating results to the patient/ family/caregiver Care coordination (not separately reported)  Note by: Eric DELENA Como, MD Date: 01/22/2024; Time: 9:00 AM

## 2024-01-22 ENCOUNTER — Ambulatory Visit
Admission: RE | Admit: 2024-01-22 | Discharge: 2024-01-22 | Disposition: A | Discharge status: Home / Self Care | Payer: 59 | Source: Ambulatory Visit | Attending: Pain Medicine | Admitting: Pain Medicine

## 2024-01-22 ENCOUNTER — Ambulatory Visit: Payer: 59 | Admitting: Pain Medicine

## 2024-01-22 ENCOUNTER — Encounter: Payer: Self-pay | Admitting: Pain Medicine

## 2024-01-22 VITALS — BP 147/66 | HR 57 | Temp 97.5°F | Resp 18 | Ht 63.0 in | Wt 145.0 lb

## 2024-01-22 DIAGNOSIS — Z09 Encounter for follow-up examination after completed treatment for conditions other than malignant neoplasm: Secondary | ICD-10-CM

## 2024-01-22 DIAGNOSIS — M961 Postlaminectomy syndrome, not elsewhere classified: Secondary | ICD-10-CM

## 2024-01-22 DIAGNOSIS — M4316 Spondylolisthesis, lumbar region: Secondary | ICD-10-CM | POA: Insufficient documentation

## 2024-01-22 DIAGNOSIS — R937 Abnormal findings on diagnostic imaging of other parts of musculoskeletal system: Secondary | ICD-10-CM | POA: Insufficient documentation

## 2024-01-22 DIAGNOSIS — M47816 Spondylosis without myelopathy or radiculopathy, lumbar region: Secondary | ICD-10-CM | POA: Insufficient documentation

## 2024-01-22 DIAGNOSIS — M5459 Other low back pain: Secondary | ICD-10-CM | POA: Insufficient documentation

## 2024-01-22 DIAGNOSIS — G039 Meningitis, unspecified: Secondary | ICD-10-CM

## 2024-01-22 DIAGNOSIS — M4726 Other spondylosis with radiculopathy, lumbar region: Secondary | ICD-10-CM | POA: Diagnosis not present

## 2024-01-22 DIAGNOSIS — M5410 Radiculopathy, site unspecified: Secondary | ICD-10-CM | POA: Insufficient documentation

## 2024-01-22 DIAGNOSIS — M25562 Pain in left knee: Secondary | ICD-10-CM

## 2024-01-22 DIAGNOSIS — M5417 Radiculopathy, lumbosacral region: Secondary | ICD-10-CM | POA: Insufficient documentation

## 2024-01-22 DIAGNOSIS — Z91041 Radiographic dye allergy status: Secondary | ICD-10-CM | POA: Insufficient documentation

## 2024-01-22 DIAGNOSIS — G8929 Other chronic pain: Secondary | ICD-10-CM | POA: Insufficient documentation

## 2024-01-22 DIAGNOSIS — M5416 Radiculopathy, lumbar region: Secondary | ICD-10-CM | POA: Insufficient documentation

## 2024-01-22 DIAGNOSIS — M545 Low back pain, unspecified: Secondary | ICD-10-CM | POA: Insufficient documentation

## 2024-01-22 DIAGNOSIS — M25462 Effusion, left knee: Secondary | ICD-10-CM | POA: Diagnosis not present

## 2024-01-22 DIAGNOSIS — M1712 Unilateral primary osteoarthritis, left knee: Secondary | ICD-10-CM | POA: Diagnosis not present

## 2024-01-22 MED ORDER — METHOCARBAMOL 1000 MG/10ML IJ SOLN
INTRAMUSCULAR | Status: AC
  Filled 2024-01-22: qty 10

## 2024-01-22 MED ORDER — PREDNISONE 50 MG PO TABS: ORAL_TABLET | ORAL | 0 refills | Status: DC

## 2024-01-22 MED ORDER — KETOROLAC TROMETHAMINE 30 MG/ML IJ SOLN
INTRAMUSCULAR | Status: AC
Start: 2024-01-22 — End: ?
  Filled 2024-01-22: qty 1

## 2024-01-22 MED ORDER — METHOCARBAMOL 1000 MG/10ML IJ SOLN
200.0000 mg | Freq: Once | INTRAMUSCULAR | Status: AC
  Administered 2024-01-22: 200 mg via INTRAMUSCULAR

## 2024-01-22 MED ORDER — DIPHENHYDRAMINE HCL 50 MG PO CAPS: ORAL_CAPSULE | ORAL | 0 refills | Status: AC

## 2024-01-22 MED ORDER — KETOROLAC TROMETHAMINE 30 MG/ML IJ SOLN
30.0000 mg | Freq: Once | INTRAMUSCULAR | Status: AC
  Administered 2024-01-22: 30 mg via INTRAMUSCULAR

## 2024-01-22 NOTE — Progress Notes (Signed)
Nursing Pain Medication Assessment:  Safety precautions to be maintained throughout the outpatient stay will include: orient to surroundings, keep bed in low position, maintain call bell within reach at all times, provide assistance with transfer out of bed and ambulation.

## 2024-01-22 NOTE — Patient Instructions (Addendum)
 ______________________________________________________________________    Premedication for patients allergic to iodinated radiological contrast  Nonurgent oral premedication:  Glucocorticoid-preferred regimen:  Adult: Oral prednisone  50 mg at 13, 7, and 1 hour prior to contrast administration.      H1 antihistamine:  Adult: Diphenhydramine  (Benadryl ) 50 mg, oral, 1 hour prior to contrast administration.    ______________________________________________________________________     ______________________________________________________________________    Procedure instructions  Stop blood-thinners  Do not eat or drink fluids (other than water ) for 6 hours before your procedure  No water  for 2 hours before your procedure  Take your blood pressure medicine with a sip of water   Arrive 30 minutes before your appointment  If sedation is planned, bring suitable driver. [place], [place], & public transportation are NOT APPROVED)  Carefully read the Preparing for your procedure detailed instructions  If you have questions call us  at [phone]  Procedure appointments are for procedures only. NO medication refills or new problem evaluations.   ______________________________________________________________________      ______________________________________________________________________    Preparing for your procedure  Appointments: If you think you may not be able to keep your appointment, call 24-48 hours in advance to cancel. We need time to make it available to others.  Procedure visits are for procedures only. During your procedure appointment there will be: NO Prescription Refills*. NO medication changes or discussions*. NO discussion of disability issues*. NO unrelated pain problem evaluations*. NO evaluations to order other pain procedures*. *These will be addressed at a separate and distinct evaluation encounter on the provider's evaluation schedule and not  during procedure days.  Instructions: Food intake: Avoid eating anything solid for at least 8 hours prior to your procedure. Clear liquid intake: You may take clear liquids such as water  up to 2 hours prior to your procedure. (No carbonated drinks. No soda.) Transportation: Unless otherwise stated by your physician, bring a driver. (Driver cannot be a Market Researcher, Pharmacist, Community, or any other form of public transportation.) Morning Medicines: Except for blood thinners, take all of your other morning medications with a sip of water . Make sure to take your heart and blood pressure medicines. If your blood pressure's lower number is above 100, the case will be rescheduled. Blood thinners: Make sure to stop your blood thinners as instructed.  If you take a blood thinner, but were not instructed to stop it, call our office [phone] and ask to talk to a nurse. Not stopping a blood thinner prior to certain procedures could lead to serious complications. Diabetics on insulin : Notify the staff so that you can be scheduled 1st case in the morning. If your diabetes requires high dose insulin , take only  of your normal insulin  dose the morning of the procedure and notify the staff that you have done so. Preventing infections: Shower with an antibacterial soap the morning of your procedure.  Build-up your immune system: Take 1000 mg of Vitamin C with every meal (3 times a day) the day prior to your procedure. Antibiotics: Inform the nursing staff if you are taking any antibiotics or if you have any conditions that may require antibiotics prior to procedures. (Example: recent joint implants)   Pregnancy: If you are pregnant make sure to notify the nursing staff. Not doing so may result in injury to the fetus, including death.  Sickness: If you have a cold, fever, or any active infections, call and cancel or reschedule your procedure. Receiving steroids while having an infection may result in complications. Arrival: You must  be in  the facility at least 30 minutes prior to your scheduled procedure. Tardiness: Your scheduled time is also the cutoff time. If you do not arrive at least 15 minutes prior to your procedure, you will be rescheduled.  Children: Do not bring any children with you. Make arrangements to keep them home. Dress appropriately: There is always a possibility that your clothing may get soiled. Avoid long dresses. Valuables: Do not bring any jewelry or valuables.  Reasons to call and reschedule or cancel your procedure: (Following these recommendations will minimize the risk of a serious complication.) Surgeries: Avoid having procedures within 2 weeks of any surgery. (Avoid for 2 weeks before or after any surgery). Flu Shots: Avoid having procedures within 2 weeks of a flu shots or . (Avoid for 2 weeks before or after immunizations). Barium: Avoid having a procedure within 7-10 days after having had a radiological study involving the use of radiological contrast. (Myelograms, Barium swallow or enema study). Heart attacks: Avoid any elective procedures or surgeries for the initial 6 months after a Myocardial Infarction (Heart Attack). Blood thinners: It is imperative that you stop these medications before procedures. Let us  know if you if you take any blood thinner.  Infection: Avoid procedures during or within two weeks of an infection (including chest colds or gastrointestinal problems). Symptoms associated with infections include: Localized redness, fever, chills, night sweats or profuse sweating, burning sensation when voiding, cough, congestion, stuffiness, runny nose, sore throat, diarrhea, nausea, vomiting, cold or Flu symptoms, recent or current infections. It is specially important if the infection is over the area that we intend to treat. Heart and lung problems: Symptoms that may suggest an active cardiopulmonary problem include: cough, chest pain, breathing difficulties or shortness of breath,  dizziness, ankle swelling, uncontrolled high or unusually low blood pressure, and/or palpitations. If you are experiencing any of these symptoms, cancel your procedure and contact your primary care physician for an evaluation.  Remember:  Regular Business hours are:  Monday to Thursday 8:00 AM to 4:00 PM  Provider's Schedule: Eric Como, MD:  Procedure days: Tuesday and Thursday 7:30 AM to 4:00 PM  Wallie Sherry, MD:  Procedure days: Monday and Wednesday 7:30 AM to 4:00 PM Last  Updated: 11/26/2023 ______________________________________________________________________      ______________________________________________________________________    General Risks and Possible Complications  Patient Responsibilities: It is important that you read this as it is part of your informed consent. It is our duty to inform you of the risks and possible complications associated with treatments offered to you. It is your responsibility as a patient to read this and to ask questions about anything that is not clear or that you believe was not covered in this document.  Patient's Rights: You have the right to refuse treatment. You also have the right to change your mind, even after initially having agreed to have the treatment done. However, under this last option, if you wait until the last second to change your mind, you may be charged for the materials used up to that point.  Introduction: Medicine is not an visual merchandiser. Everything in Medicine, including the lack of treatment(s), carries the potential for danger, harm, or loss (which is by definition: Risk). In Medicine, a complication is a secondary problem, condition, or disease that can aggravate an already existing one. All treatments carry the risk of possible complications. The fact that a side effects or complications occurs, does not imply that the treatment was conducted incorrectly. It must be clearly understood  that these can happen even  when everything is done following the highest safety standards.  No treatment: You can choose not to proceed with the proposed treatment alternative. The "PRO(s)" would include: avoiding the risk of complications associated with the therapy. The "CON(s)" would include: not getting any of the treatment benefits. These benefits fall under one of three categories: diagnostic; therapeutic; and/or palliative. Diagnostic benefits include: getting information which can ultimately lead to improvement of the disease or symptom(s). Therapeutic benefits are those associated with the successful treatment of the disease. Finally, palliative benefits are those related to the decrease of the primary symptoms, without necessarily curing the condition (example: decreasing the pain from a flare-up of a chronic condition, such as incurable terminal cancer).  General Risks and Complications: These are associated to most interventional treatments. They can occur alone, or in combination. They fall under one of the following six (6) categories: no benefit or worsening of symptoms; bleeding; infection; nerve damage; allergic reactions; and/or death. No benefits or worsening of symptoms: In Medicine there are no guarantees, only probabilities. No healthcare provider can ever guarantee that a medical treatment will work, they can only state the probability that it may. Furthermore, there is always the possibility that the condition may worsen, either directly, or indirectly, as a consequence of the treatment. Bleeding: This is more common if the patient is taking a blood thinner, either prescription or over the counter (example: Goody Powders, Fish oil, Aspirin , Garlic, etc.), or if suffering a condition associated with impaired coagulation (example: Hemophilia, cirrhosis of the liver, low platelet counts, etc.). However, even if you do not have one on these, it can still happen. If you have any of these conditions, or take one of  these drugs, make sure to notify your treating physician. Infection: This is more common in patients with a compromised immune system, either due to disease (example: diabetes, cancer, human immunodeficiency virus [HIV], etc.), or due to medications or treatments (example: therapies used to treat cancer and rheumatological diseases). However, even if you do not have one on these, it can still happen. If you have any of these conditions, or take one of these drugs, make sure to notify your treating physician. Nerve Damage: This is more common when the treatment is an invasive one, but it can also happen with the use of medications, such as those used in the treatment of cancer. The damage can occur to small secondary nerves, or to large primary ones, such as those in the spinal cord and brain. This damage may be temporary or permanent and it may lead to impairments that can range from temporary numbness to permanent paralysis and/or brain death. Allergic Reactions: Any time a substance or material comes in contact with our body, there is the possibility of an allergic reaction. These can range from a mild skin rash (contact dermatitis) to a severe systemic reaction (anaphylactic reaction), which can result in death. Death: In general, any medical intervention can result in death, most of the time due to an unforeseen complication. ______________________________________________________________________

## 2024-01-27 DIAGNOSIS — Z961 Presence of intraocular lens: Secondary | ICD-10-CM | POA: Diagnosis not present

## 2024-01-27 DIAGNOSIS — H189 Unspecified disorder of cornea: Secondary | ICD-10-CM | POA: Diagnosis not present

## 2024-01-27 DIAGNOSIS — E119 Type 2 diabetes mellitus without complications: Secondary | ICD-10-CM | POA: Diagnosis not present

## 2024-01-27 LAB — HM DIABETES EYE EXAM

## 2024-01-28 DIAGNOSIS — R072 Precordial pain: Secondary | ICD-10-CM | POA: Diagnosis not present

## 2024-01-29 ENCOUNTER — Encounter: Payer: Self-pay | Admitting: Cardiovascular Disease

## 2024-01-29 LAB — BASIC METABOLIC PANEL
BUN/Creatinine Ratio: 8 — ABNORMAL LOW (ref 12–28)
BUN: 10 mg/dL (ref 8–27)
CO2: 25 mmol/L (ref 20–29)
Calcium: 9.6 mg/dL (ref 8.7–10.3)
Chloride: 107 mmol/L — ABNORMAL HIGH (ref 96–106)
Creatinine, Ser: 1.32 mg/dL — ABNORMAL HIGH (ref 0.57–1.00)
Glucose: 109 mg/dL — ABNORMAL HIGH (ref 70–99)
Potassium: 4.2 mmol/L (ref 3.5–5.2)
Sodium: 144 mmol/L (ref 134–144)
eGFR: 43 mL/min/{1.73_m2} — ABNORMAL LOW (ref >59)

## 2024-01-29 NOTE — Patient Instructions (Signed)

## 2024-01-29 NOTE — Progress Notes (Unsigned)
PROVIDER NOTE: Interpretation of information contained herein should be left to medically-trained personnel. Specific patient instructions are provided elsewhere under "Patient Instructions" section of medical record. This document was created in part using STT-dictation technology, any transcriptional errors that may result from this process are unintentional.  Patient: Julie Acevedo Type: Established DOB: 01/21/1951 MRN: [id number] PCP: Ronnald Ramp, MD  Service: Procedure DOS: 01/30/2024 Setting: Ambulatory Location: Ambulatory outpatient facility Delivery: Face-to-face Provider: Oswaldo Done, MD Specialty: Interventional Pain Management Specialty designation: 09 Location: Outpatient facility Ref. Prov.: Simmons-Robinson, Makie*       Interventional Therapy   Procedure: Caudal Epidural Steroid Injection + Epidurogram   #1  Laterality: Midline aiming left Level: Sacrococcygeal ligament  Imaging: Fluoroscopy-guided Spinal ([id number]) Anesthesia: Local anesthesia (1-2% Lidocaine) Premedication: On 01/22/2024 the patient was provided with instructions for contrast allergy premedication protocol with prednisone and diphenhydramine. Anxiolysis: IV Versed         Sedation:   Fentanyl         DOS: 01/30/2024  Performed by: Oswaldo Done, MD  Purpose: Diagnostic/Therapeutic Indications: Low back and lower extremity pain severe enough to impact quality of life or function. Rationale (medical necessity): procedure needed and proper for the diagnosis and/or treatment of Julie Acevedo's medical symptoms and needs. 1. Chronic low back pain (1ry area of Pain) (Bilateral) (R>L) w/o sciatica   2. Chronic low back pain of over 3 months duration   3. Chronic lower extremity pain (2ry area of Pain) (Right)   4. Failed back surgical syndrome   5. Degeneration of intervertebral disc of lumbar region with discogenic back pain and lower extremity pain   6. Lumbar radiculopathy (S1)  (Left)   7. Lumbosacral radiculopathy at S1 (Left)   8. Radicular pain of lower extremity (Left)   9. History of allergy to radiographic contrast media    NAS-11 Pain score:   Pre-procedure:  /10   Post-procedure:  /10     Target: Lumbosacral epidural canal  Location: Epidural space  Region: Caudal canal Approach: Percutaneous  Type of procedure: Epidural block  Position  Prep  Materials:  Position: Prone  Prep solution: ChloraPrep (2% chlorhexidine gluconate and 70% isopropyl alcohol) Prep Area: Entire posterior lumbosacral area  Materials:  Tray: Epidural          Needle(s):  Type: Epidural  Gauge (G): 17  Length: 3.5-in  Qty: 1   H&P (Pre-op Assessment):  Julie Acevedo is a 73 y.o. (year old), female patient, seen today for interventional treatment. She  has a past surgical history that includes Hemicolectomy; Lumbar disc surgery; Roux-en-Y Gastric Bypass (2010); Cholecystectomy; Eye surgery; Hand surgery; Foot surgery; Abdominal hysterectomy; Tonsillectomy; Upper gi endoscopy (01/12/2016); Cardiac catheterization (2000); Laparoscopic lysis of adhesions (N/A, 08/21/2016); Ventral hernia repair (N/A, 08/21/2016); Insertion of mesh (N/A, 08/21/2016); Cystocele repair (N/A, 03/19/2017); Cystoscopy (N/A, 03/19/2017); Laparoscopic removal abdominal mass; LOOP RECORDER INSERTION (N/A, 03/21/2018); laparotomy (N/A, 04/16/2019); XI robotic assisted ventral hernia (N/A, 12/08/2019); Insertion of mesh (N/A, 12/08/2019); Breast excisional biopsy (Right, 1971); Small intestine surgery; and Esophagogastroduodenoscopy (egd) with propofol (N/A, 12/15/2021). Julie Acevedo has a current medication list which includes the following prescription(s): accu-chek guide, accu-chek softclix lancets, atorvastatin, blood glucose meter kit and supplies, calcitriol, diazoxide, diphenhydramine, gabapentin, hydroxyzine, ibuprofen, levetiracetam, linzess, metoprolol succinate, mirtazapine, omeprazole, ondansetron,  prednisone, prednisone, rivastigmine, rosuvastatin, trazodone, and triamcinolone cream. Her primarily concern today is the No chief complaint on file.  Initial Vital Signs:  Pulse/HCG Rate:    Temp:   Resp:  BP:   SpO2:    BMI: Estimated body mass index is 25.69 kg/m as calculated from the following:   Height as of 01/22/24: 5\' 3"  (1.6 m).   Weight as of 01/22/24: 145 lb (65.8 kg).  Risk Assessment: Allergies: Reviewed. She is allergic to lotrel [amlodipine besy-benazepril hcl], contrast media [iodinated contrast media], niacin, and sulfa antibiotics.  Allergy Precautions: None required Coagulopathies: Reviewed. None identified.  Blood-thinner therapy: None at this time Active Infection(s): Reviewed. None identified. Julie Acevedo is afebrile  Site Confirmation: Julie Acevedo was asked to confirm the procedure and laterality before marking the site Procedure checklist: Completed Consent: Before the procedure and under the influence of no sedative(s), amnesic(s), or anxiolytics, the patient was informed of the treatment options, risks and possible complications. To fulfill our ethical and legal obligations, as recommended by the American Medical Association's Code of Ethics, I have informed the patient of my clinical impression; the nature and purpose of the treatment or procedure; the risks, benefits, and possible complications of the intervention; the alternatives, including doing nothing; the risk(s) and benefit(s) of the alternative treatment(s) or procedure(s); and the risk(s) and benefit(s) of doing nothing. The patient was provided information about the general risks and possible complications associated with the procedure. These may include, but are not limited to: failure to achieve desired goals, infection, bleeding, organ or nerve damage, allergic reactions, paralysis, and death. In addition, the patient was informed of those risks and complications associated to Spine-related procedures,  such as failure to decrease pain; infection (i.e.: Meningitis, epidural or intraspinal abscess); bleeding (i.e.: epidural hematoma, subarachnoid hemorrhage, or any other type of intraspinal or peri-dural bleeding); organ or nerve damage (i.e.: Any type of peripheral nerve, nerve root, or spinal cord injury) with subsequent damage to sensory, motor, and/or autonomic systems, resulting in permanent pain, numbness, and/or weakness of one or several areas of the body; allergic reactions; (i.e.: anaphylactic reaction); and/or death. Furthermore, the patient was informed of those risks and complications associated with the medications. These include, but are not limited to: allergic reactions (i.e.: anaphylactic or anaphylactoid reaction(s)); adrenal axis suppression; blood sugar elevation that in diabetics may result in ketoacidosis or comma; water retention that in patients with history of congestive heart failure may result in shortness of breath, pulmonary edema, and decompensation with resultant heart failure; weight gain; swelling or edema; medication-induced neural toxicity; particulate matter embolism and blood vessel occlusion with resultant organ, and/or nervous system infarction; and/or aseptic necrosis of one or more joints. Finally, the patient was informed that Medicine is not an exact science; therefore, there is also the possibility of unforeseen or unpredictable risks and/or possible complications that may result in a catastrophic outcome. The patient indicated having understood very clearly. We have given the patient no guarantees and we have made no promises. Enough time was given to the patient to ask questions, all of which were answered to the patient's satisfaction. Ms. Castro has indicated that she wanted to continue with the procedure. Attestation: I, the ordering provider, attest that I have discussed with the patient the benefits, risks, side-effects, alternatives, likelihood of achieving  goals, and potential problems during recovery for the procedure that I have provided informed consent. Date  Time: {CHL [facility]-PAIN TIME CHOICES:[id number]}   Pre-Procedure Preparation:  Monitoring: As per clinic protocol. Respiration, ETCO2, SpO2, BP, heart rate and rhythm monitor placed and checked for adequate function Safety Precautions: Patient was assessed for positional comfort and pressure points before starting the procedure. Time-out: I initiated  and conducted the "Time-out" before starting the procedure, as per protocol. The patient was asked to participate by confirming the accuracy of the "Time Out" information. Verification of the correct person, site, and procedure were performed and confirmed by me, the nursing staff, and the patient. "Time-out" conducted as per Joint Commission's Universal Protocol (UP.01.01.01). Time:   Start Time:   hrs.  Description  Narrative of Procedure:          Start Time:   hrs.  Technical description of procedure:  Safety Precautions: Aspiration looking for blood return was conducted prior to all injections. At no point did we inject any substances, as a needle was being advanced. No attempts were made at seeking any paresthesias. Safe injection practices and needle disposal techniques used. Medications properly checked for expiration dates. SDV (single dose vial) medications used. Description of the Procedure: Protocol guidelines were followed. The patient was placed in position over the fluoroscopy table. The target area was identified and the area prepped in the usual manner. Skin & deeper tissues infiltrated with local anesthetic. Appropriate amount of time allowed to pass for local anesthetics to take effect. The procedure needle was then advanced to the target area. Proper needle placement secured. Negative aspiration confirmed.    ***   Solution injected in intermittent fashion, asking for systemic symptoms every 0.5cc of injectate. The needle/catheter  were removed and the area cleansed, making sure to leave some of the prepping solution back to take advantage of its long term bactericidal properties.  There were no vitals filed for this visit.   End Time:   hrs.  Imaging Guidance (Spinal):          Type of Imaging Technique: Fluoroscopy Guidance (Spinal) Indication(s): Fluoroscopy guidance for needle placement to enhance accuracy in procedures requiring precise needle localization for targeted delivery of medication in or near specific anatomical locations not easily accessible without such real-time imaging assistance. Exposure Time: Please see nurses notes. Contrast: Before injecting any contrast, we confirmed that the patient did not have an allergy to iodine, shellfish, or radiological contrast. Once satisfactory needle placement was completed at the desired level, radiological contrast was injected. Contrast injected under live fluoroscopy. No contrast complications. See chart for type and volume of contrast used. Fluoroscopic Guidance: I was personally present during the use of fluoroscopy. "Tunnel Vision Technique" used to obtain the best possible view of the target area. Parallax error corrected before commencing the procedure. "Direction-depth-direction" technique used to introduce the needle under continuous pulsed fluoroscopy. Once target was reached, antero-posterior, oblique, and lateral fluoroscopic projection used confirm needle placement in all planes. Images permanently stored in EMR. Interpretation: I personally interpreted the imaging intraoperatively. Adequate needle placement confirmed in multiple planes. Appropriate spread of contrast into desired area was observed. No evidence of afferent or efferent intravascular uptake. No intrathecal or subarachnoid spread observed. Permanent images saved into the patient's record.  Diagnostic Epidurogram:  Contrast:  Type: Non-ionic, water soluble, hypoallergenic, myelogram-compatible,  radiological contrast used. Please see orders and nurses note for specific choice of contrast. Volume: Please see nurses note for injected volume.  Observations:  Spinal Alignment: Adequate       Vertebral body: Intact Lamina: Intact Disc: Disc hight preserved Facet: Within Normal Limits.        Hardware: None  Spread: Appropriate epidural spread of contrast Anterior: Adequate Posterior: Adequate Superior (cephalad): Adequate Inferior (caudad): Adequate Right lateral: Adequate Left lateral: Adequate Plica medialis dorsalis: Medially aligned Nerve root(s): Adequate  Epidural extravasation:  None observed Intrathecal: No intrathecal spread identified Subarachnoid: No subarachnoid spread pattern observed Vascular: No evidence of afferent or efferent intravascular uptake  Impression: Technically successful epidurogram. See above for details Note: Electronic copies saved to PACS (EMR).  Post-operative Assessment:  Post-procedure Vital Signs:  Pulse/HCG Rate:    Temp:   Resp:   BP:   SpO2:    EBL: None  Complications: No immediate post-treatment complications observed by team, or reported by patient.  Note: The patient tolerated the entire procedure well. A repeat set of vitals were taken after the procedure and the patient was kept under observation following institutional policy, for this type of procedure. Post-procedural neurological assessment was performed, showing return to baseline, prior to discharge. The patient was provided with post-procedure discharge instructions, including a section on how to identify potential problems. Should any problems arise concerning this procedure, the patient was given instructions to immediately contact us, at any time, without hesitation. In any case, we plan to contact the patient by telephone for a follow-up status report regarding this interventional procedure.  Comments:  No additional relevant information.  Plan of Care (POC)   Orders:  No orders of the defined types were placed in this encounter.  Chronic Opioid Analgesic:  No chronic opioid analgesics therapy prescribed by our practice. None MME/day: 0 mg/day   Medications ordered for procedure: No orders of the defined types were placed in this encounter.  Medications administered: [name] had no medications administered during this visit.  See the medical record for exact dosing, route, and time of administration.  Follow-up plan:   No follow-ups on file.       Interventional Therapies  Risk Factors  Considerations:  Cardiologist: Dorothyann Peng, MD ([facility])  Neurologist: Cristopher Peru, MD [facility] neurology) Hx of MI (1995)  Hx of TIAs  T2DM  Stage 3b CKD  [name]  Memory impairment  Hx of Bowel Obstruction (04/2019)  GERD  s/p Gastric Bypass for obesity  Malnutrition-malabsorption  Hx. Seizures      Planned  Pending:   Diagnostic left caudal ESI #1 (contrast allergy protocol ordered)    Under consideration:   Diagnostic left caudal ESI #1  Diagnostic bilateral lumbar facet MBB #2  Diagnostic/therapeutic right IA hip joint injection #1    Completed:   Diagnostic bilateral lumbar facet MBB x1 (09/10/2023) (100/100/80 x 1 month)    Therapeutic  Palliative (PRN) options:   None established   Completed by other providers:   Therapeutic/diagnostic right L5 TFESI x2 (04/11/2016, 05/02/2016) by Merri Ray, DO [facility])  Therapeutic/diagnostic right S1 TFESI x2 (04/11/2016, 05/02/2016) by Merri Ray, DO [facility])  Therapeutic/diagnostic right L3 TFESI x1 (07/19/2016) by Merri Ray, DO [facility])       Recent Visits Date Type Provider Dept  01/22/24 Office Visit Delano Metz, MD [facility]  Showing recent visits within past 90 days and meeting all other requirements Future Appointments Date Type Provider Dept  01/30/24 Appointment Delano Metz, MD [facility]   Showing future appointments within next 90 days and meeting all other requirements  Disposition: Discharge home  Discharge (Date  Time): 01/30/2024;   hrs.   Primary Care Physician: Ronnald Ramp, MD Location: [facility] Note by: Oswaldo Done, MD (TTS technology used. I apologize for any typographical errors that were not detected and corrected.) Date: 01/30/2024; Time: 12:22 PM  Disclaimer:  Medicine is not an Visual merchandiser. The only guarantee in  medicine is that nothing is guaranteed. It is important to note that the decision to proceed with this intervention was based on the information collected from the patient. The Data and conclusions were drawn from the patient's questionnaire, the interview, and the physical examination. Because the information was provided in large part by the patient, it cannot be guaranteed that it has not been purposely or unconsciously manipulated. Every effort has been made to obtain as much relevant data as possible for this evaluation. It is important to note that the conclusions that lead to this procedure are derived in large part from the available data. Always take into account that the treatment will also be dependent on availability of resources and existing treatment guidelines, considered by other Pain Management Practitioners as being common knowledge and practice, at the time of the intervention. For Medico-Legal purposes, it is also important to point out that variation in procedural techniques and pharmacological choices are the acceptable norm. The indications, contraindications, technique, and results of the above procedure should only be interpreted and judged by a Board-Certified Interventional Pain Specialist with extensive familiarity and expertise in the same exact procedure and technique.

## 2024-01-30 ENCOUNTER — Ambulatory Visit: Payer: 59 | Attending: Pain Medicine | Admitting: Pain Medicine

## 2024-01-30 ENCOUNTER — Ambulatory Visit
Admission: RE | Admit: 2024-01-30 | Discharge: 2024-01-30 | Disposition: A | Discharge status: Home / Self Care | Payer: 59 | Source: Ambulatory Visit | Attending: Pain Medicine | Admitting: Pain Medicine

## 2024-01-30 VITALS — BP 139/87 | HR 75 | Temp 97.6°F | Resp 16 | Ht 63.0 in | Wt 145.0 lb

## 2024-01-30 DIAGNOSIS — M79604 Pain in right leg: Secondary | ICD-10-CM | POA: Diagnosis not present

## 2024-01-30 DIAGNOSIS — Z91041 Radiographic dye allergy status: Secondary | ICD-10-CM | POA: Diagnosis not present

## 2024-01-30 DIAGNOSIS — G8929 Other chronic pain: Secondary | ICD-10-CM | POA: Diagnosis not present

## 2024-01-30 DIAGNOSIS — M5410 Radiculopathy, site unspecified: Secondary | ICD-10-CM | POA: Diagnosis not present

## 2024-01-30 DIAGNOSIS — M5417 Radiculopathy, lumbosacral region: Secondary | ICD-10-CM | POA: Insufficient documentation

## 2024-01-30 DIAGNOSIS — M51362 Other intervertebral disc degeneration, lumbar region with discogenic back pain and lower extremity pain: Secondary | ICD-10-CM | POA: Insufficient documentation

## 2024-01-30 DIAGNOSIS — M961 Postlaminectomy syndrome, not elsewhere classified: Secondary | ICD-10-CM | POA: Insufficient documentation

## 2024-01-30 DIAGNOSIS — M545 Low back pain, unspecified: Secondary | ICD-10-CM | POA: Diagnosis not present

## 2024-01-30 DIAGNOSIS — M5416 Radiculopathy, lumbar region: Secondary | ICD-10-CM | POA: Insufficient documentation

## 2024-01-30 MED ORDER — SODIUM CHLORIDE (PF) 0.9 % IJ SOLN
INTRAMUSCULAR | Status: AC
  Filled 2024-01-30: qty 10

## 2024-01-30 MED ORDER — ROPIVACAINE HCL 2 MG/ML IJ SOLN
2.0000 mL | Freq: Once | INTRAMUSCULAR | Status: AC
Start: 2024-01-30 — End: 2024-01-30
  Administered 2024-01-30: 2 mL via EPIDURAL

## 2024-01-30 MED ORDER — MIDAZOLAM HCL 5 MG/5ML IJ SOLN
INTRAMUSCULAR | Status: AC
  Filled 2024-01-30: qty 5

## 2024-01-30 MED ORDER — SODIUM CHLORIDE 0.9% FLUSH
2.0000 mL | Freq: Once | INTRAVENOUS | Status: AC
  Administered 2024-01-30: 2 mL

## 2024-01-30 MED ORDER — LIDOCAINE HCL 2 % IJ SOLN
20.0000 mL | Freq: Once | INTRAMUSCULAR | Status: AC
Start: 2024-01-30 — End: 2024-01-30
  Administered 2024-01-30: 100 mg

## 2024-01-30 MED ORDER — IOHEXOL 180 MG/ML  SOLN: 10.0000 mL | Freq: Once | INTRAMUSCULAR | Status: DC

## 2024-01-30 MED ORDER — IOHEXOL 180 MG/ML  SOLN
10.0000 mL | Freq: Once | INTRAMUSCULAR | Status: AC
  Administered 2024-01-30: 10 mL via EPIDURAL

## 2024-01-30 MED ORDER — FENTANYL CITRATE (PF) 100 MCG/2ML IJ SOLN
INTRAMUSCULAR | Status: AC
  Filled 2024-01-30: qty 2

## 2024-01-30 MED ORDER — ROPIVACAINE HCL 2 MG/ML IJ SOLN
INTRAMUSCULAR | Status: AC
  Filled 2024-01-30: qty 20

## 2024-01-30 MED ORDER — TRIAMCINOLONE ACETONIDE 40 MG/ML IJ SUSP
INTRAMUSCULAR | Status: AC
Start: 2024-01-30 — End: ?
  Filled 2024-01-30: qty 1

## 2024-01-30 MED ORDER — FENTANYL CITRATE (PF) 100 MCG/2ML IJ SOLN: 25.0000 ug | INTRAMUSCULAR | Status: DC | PRN

## 2024-01-30 MED ORDER — PENTAFLUOROPROP-TETRAFLUOROETH EX AERO
INHALATION_SPRAY | Freq: Once | CUTANEOUS | Status: AC
  Administered 2024-01-30: 30 via TOPICAL

## 2024-01-30 MED ORDER — TRIAMCINOLONE ACETONIDE 40 MG/ML IJ SUSP
40.0000 mg | Freq: Once | INTRAMUSCULAR | Status: AC
  Administered 2024-01-30: 40 mg

## 2024-01-30 MED ORDER — LIDOCAINE HCL (PF) 2 % IJ SOLN
INTRAMUSCULAR | Status: AC
  Filled 2024-01-30: qty 10

## 2024-01-30 MED ORDER — MIDAZOLAM HCL 5 MG/5ML IJ SOLN
0.5000 mg | Freq: Once | INTRAMUSCULAR | Status: AC
  Administered 2024-01-30: 2 mg via INTRAVENOUS

## 2024-01-30 MED ORDER — IOHEXOL 180 MG/ML  SOLN
INTRAMUSCULAR | Status: AC
  Filled 2024-01-30: qty 10

## 2024-01-30 NOTE — Progress Notes (Signed)
Safety precautions to be maintained throughout the outpatient stay will include: orient to surroundings, keep bed in low position, maintain call bell within reach at all times, provide assistance with transfer out of bed and ambulation.

## 2024-01-31 ENCOUNTER — Telehealth: Payer: Self-pay | Admitting: *Deleted

## 2024-01-31 ENCOUNTER — Encounter: Payer: Self-pay | Admitting: Oncology

## 2024-01-31 NOTE — Telephone Encounter (Signed)
Post procedure call;  patient reports she is doing well.  ?

## 2024-02-03 DIAGNOSIS — R519 Headache, unspecified: Secondary | ICD-10-CM | POA: Diagnosis not present

## 2024-02-03 DIAGNOSIS — R2689 Other abnormalities of gait and mobility: Secondary | ICD-10-CM | POA: Diagnosis not present

## 2024-02-03 DIAGNOSIS — E559 Vitamin D deficiency, unspecified: Secondary | ICD-10-CM | POA: Diagnosis not present

## 2024-02-03 DIAGNOSIS — R569 Unspecified convulsions: Secondary | ICD-10-CM | POA: Diagnosis not present

## 2024-02-03 DIAGNOSIS — Z87828 Personal history of other (healed) physical injury and trauma: Secondary | ICD-10-CM | POA: Diagnosis not present

## 2024-02-06 ENCOUNTER — Other Ambulatory Visit (HOSPITAL_COMMUNITY): Payer: 59

## 2024-02-07 ENCOUNTER — Encounter: Payer: Self-pay | Admitting: Oncology

## 2024-02-07 ENCOUNTER — Ambulatory Visit: Payer: 59 | Admitting: Family Medicine

## 2024-02-07 ENCOUNTER — Encounter: Payer: Self-pay | Admitting: Family Medicine

## 2024-02-07 ENCOUNTER — Telehealth (HOSPITAL_COMMUNITY): Payer: Self-pay | Admitting: *Deleted

## 2024-02-07 VITALS — BP 132/65 | HR 51 | Ht 63.0 in | Wt 146.0 lb

## 2024-02-07 DIAGNOSIS — R0989 Other specified symptoms and signs involving the circulatory and respiratory systems: Secondary | ICD-10-CM

## 2024-02-07 NOTE — Progress Notes (Signed)
Established patient visit   Patient: Julie Acevedo   DOB: [birth year]   73 y.o. Female  MRN: [id number] Visit Date: 02/07/2024  Today's healthcare provider: Ronnald Ramp, MD   Chief Complaint  Patient presents with   Medical Management of Chronic Issues   Subjective       Discussed the use of AI scribe software for clinical note transcription with the patient, who gave verbal consent to proceed.  History of Present Illness   Julie Acevedo is a 73 year old female who presents with a swollen lymph node.  She is here for follow-up regarding a previously noted swollen lymph node. The lymph node has remained stable in size and has not caused any difficulty swallowing. An ultrasound indicated it is mildly prominent and possibly reactive. A complete blood count (CBC) showed a slightly low hemoglobin level of 11 g/dL, but the white blood cell count was not elevated. Her last thyroid-stimulating hormone (TSH) level was 2.48, which is normal.  No new symptoms or changes have been noted since her last visit. She denies experiencing any fevers or chills. She describes herself as 'cold natured' and mentions keeping her house warm to stay comfortable. Her weight has been stable at 145-146 pounds, and she notes that her 'little bones just standing up' but does not find it irritating.         Past Medical History:  Diagnosis Date   Anemia    Arthritis    COVID-19 11/19/2019   Diverticulitis    DM (diabetes mellitus) (HCC)    typ e 2   History of methicillin resistant staphylococcus aureus (MRSA) 2017   HTN (hypertension)    Hyperlipidemia    L-S radiculopathy 06/11/2015   Left leg swelling 01/22/2023   Lower abdominal pain 08/08/2022   Memory difficulties 09/01/2015   Multiple thyroid nodules    Myocardial infarction [facility] 1995   Partial small bowel obstruction (HCC) 04/23/2016   SBO (small bowel obstruction) (HCC)    Short-term memory loss    Thyroid nodule      Medications: Outpatient Medications Prior to Visit  Medication Sig   ACCU-CHEK GUIDE test strip USE TO CHECK BLOOD SUGAR UP TO 4 TIMES A DAY AS DIRECTED   Accu-Chek Softclix Lancets lancets SMARTSIG:Topical 1-4 Times Daily   atorvastatin (LIPITOR) 40 MG tablet Take 1 tablet by mouth every morning.   blood glucose meter kit and supplies Dispense Accu-Chek Guide. Use up to four times daily as directed. 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   calcitRIOL (ROCALTROL) 0.25 MCG capsule Take 0.25 mcg by mouth daily.   diazoxide (PROGLYCEM) 50 MG/ML suspension take 1ml (50mg  total) BY MOUTH EVERY 8 HOURS   diphenhydrAMINE (BENADRYL) 50 MG capsule Take 50 mg by mouth 1 hour before procedure. Must have a driver.   gabapentin (NEURONTIN) 300 MG capsule TAKE 1 CAPSULE BY MOUTH 3 TIMES DAILY   hydrOXYzine (VISTARIL) 100 MG capsule TAKE 1 CAPSULE BY MOUTH 3 TIMES DAILY   ibuprofen (ADVIL) 600 MG tablet Take 600 mg by mouth every 6 (six) hours as needed.   levETIRAcetam (KEPPRA) 500 MG tablet Take 1 tablet (500 mg total) by mouth 2 (two) times daily.   LINZESS 290 MCG CAPS capsule TAKE ONE CAPSULE BY MOUTH BEFORE BREAKFAST daily   metoprolol succinate (TOPROL-XL) 25 MG 24 hr tablet Take 1 tablet (25 mg total) by mouth every morning.   mirtazapine (REMERON SOL-TAB) 15 MG disintegrating tablet TAKE 1 TABLET BY MOUTH  EVERYDAY AT BEDTIME   omeprazole (PRILOSEC) 20 MG capsule TAKE 1 CAPSULE BY MOUTH ONCE DAILY   ondansetron (ZOFRAN-ODT) 4 MG disintegrating tablet Take 4 mg by mouth every 8 (eight) hours as needed.   rivastigmine (EXELON) 3 MG capsule Take 3 mg by mouth 2 (two) times daily.   rosuvastatin (CRESTOR) 40 MG tablet Take 1 tablet (40 mg total) by mouth daily.   traZODone (DESYREL) 50 MG tablet Take 1 tablet by mouth at bedtime.   triamcinolone cream (KENALOG) 0.5 % Apply 1 Application topically 3 (three) times daily as needed (flares).   [DISCONTINUED] predniSONE (DELTASONE) 50 MG tablet Prednisone 50  mg - take 13 hours prior to test,Take another Prednisone 50 mg 7 hours prior to test,Take another Prednisone 50 mg 1 hour prior to test and Take Benadryl 50 mg 1 hour prior to test   [DISCONTINUED] predniSONE (DELTASONE) 50 MG tablet Take 1 tab PO 13 hrs, 7 hrs, and 1 hr before contrast media injection (procedure).   No facility-administered medications prior to visit.    Review of Systems  Last CBC Lab Results  Component Value Date   WBC 6.7 01/17/2024   HGB 11.0 (L) 01/17/2024   HCT 34.4 01/17/2024   MCV 84 01/17/2024   MCH 26.7 01/17/2024   RDW 14.3 01/17/2024   PLT 295 01/17/2024   Last metabolic panel Lab Results  Component Value Date   GLUCOSE 109 (H) 01/28/2024   NA 144 01/28/2024   K 4.2 01/28/2024   CL 107 (H) 01/28/2024   CO2 25 01/28/2024   BUN 10 01/28/2024   CREATININE 1.32 (H) 01/28/2024   EGFR 43 (L) 01/28/2024   CALCIUM 9.6 01/28/2024   PHOS 3.4 11/20/2022   PROT 7.5 06/19/2023   ALBUMIN 4.0 06/19/2023   LABGLOB 2.6 12/05/2020   AGRATIO 1.5 12/05/2020   BILITOT 0.5 06/19/2023   ALKPHOS 209 (H) 06/19/2023   AST 61 (H) 06/19/2023   ALT 53 (H) 06/19/2023   ANIONGAP 8 06/19/2023   Lab Results  Component Value Date   TSH 2.483 01/18/2023         Objective    BP 132/65   Pulse (!) 51   Ht 5\' 3"  (1.6 m)   Wt 146 lb (66.2 kg)   BMI 25.86 kg/m  BP Readings from Last 3 Encounters:  02/07/24 132/65  01/30/24 139/87  01/22/24 (!) 147/66   Wt Readings from Last 3 Encounters:  02/07/24 146 lb (66.2 kg)  01/30/24 145 lb (65.8 kg)  01/22/24 145 lb (65.8 kg)        Physical Exam Pulmonary:     Effort: Pulmonary effort is normal. No respiratory distress.  Lymphadenopathy:     Cervical: Cervical adenopathy present.       No results found for any visits on 02/07/24.  Assessment & Plan     Problem List Items Addressed This Visit   None Visit Diagnoses       Tender lymph node    -  Primary           Swollen Lymph Node Mildly  prominent lymph node, stable, not causing symptoms such as dysphagia. CBC normal except for slightly low hemoglobin at 11. Ultrasound showed mildly prominent lymph node, possibly reactive, not concerning for malignancy. No recent fevers or chills. - Monitor lymph node for changes in size or symptoms - No further diagnostic tests or interventions at this time  Mild Anemia Hemoglobin level slightly low at 11. No other abnormalities  in CBC. No reported symptoms related to anemia. - Monitor hemoglobin levels in future visits - Encourage a diet rich in iron  Cold Intolerance Reports frequent cold sensation, consistent with previous TSH level of 2.48, indicating normal thyroid function. Likely benign. - No specific treatment required - Encourage maintaining a comfortable home environment  General Health Maintenance Blood pressure normal. Weight stable at 145-146 lbs. Wellness visit scheduled for April 06, 2024. - Continue routine health maintenance - Attend scheduled wellness visit on April 06, 2024.         No follow-ups on file.         Ronnald Ramp, MD  [facility] [phone] (phone) [phone] (fax)  [facility]

## 2024-02-07 NOTE — Telephone Encounter (Signed)
 Reaching out to patient to offer assistance regarding upcoming cardiac imaging study; pt verbalizes understanding of appt date/time, parking situation and where to check in, pre-test NPO status and medications ordered, and verified current allergies; name and call back number provided for further questions should they arise Johney Frame RN Navigator Cardiac Imaging [facility] [phone] office [phone] cell  Patient verbalized understanding of allergy prep.

## 2024-02-07 NOTE — Telephone Encounter (Signed)
 Attempted to call patient regarding upcoming cardiac CT appointment. Left message on voicemail with name and callback number Johney Frame RN Navigator Cardiac Imaging [facility] [phone] Office

## 2024-02-08 DIAGNOSIS — G8929 Other chronic pain: Secondary | ICD-10-CM | POA: Diagnosis not present

## 2024-02-08 DIAGNOSIS — Z87891 Personal history of nicotine dependence: Secondary | ICD-10-CM | POA: Diagnosis not present

## 2024-02-08 DIAGNOSIS — I959 Hypotension, unspecified: Secondary | ICD-10-CM | POA: Diagnosis not present

## 2024-02-08 DIAGNOSIS — M1990 Unspecified osteoarthritis, unspecified site: Secondary | ICD-10-CM | POA: Diagnosis not present

## 2024-02-08 DIAGNOSIS — E041 Nontoxic single thyroid nodule: Secondary | ICD-10-CM | POA: Diagnosis not present

## 2024-02-08 DIAGNOSIS — G4700 Insomnia, unspecified: Secondary | ICD-10-CM | POA: Diagnosis not present

## 2024-02-08 DIAGNOSIS — E119 Type 2 diabetes mellitus without complications: Secondary | ICD-10-CM | POA: Diagnosis not present

## 2024-02-08 DIAGNOSIS — I10 Essential (primary) hypertension: Secondary | ICD-10-CM | POA: Diagnosis not present

## 2024-02-08 DIAGNOSIS — M545 Low back pain, unspecified: Secondary | ICD-10-CM | POA: Diagnosis not present

## 2024-02-08 DIAGNOSIS — E538 Deficiency of other specified B group vitamins: Secondary | ICD-10-CM | POA: Diagnosis not present

## 2024-02-08 DIAGNOSIS — G20A1 Parkinson's disease without dyskinesia, without mention of fluctuations: Secondary | ICD-10-CM | POA: Diagnosis not present

## 2024-02-08 DIAGNOSIS — R634 Abnormal weight loss: Secondary | ICD-10-CM | POA: Diagnosis not present

## 2024-02-08 DIAGNOSIS — Z8673 Personal history of transient ischemic attack (TIA), and cerebral infarction without residual deficits: Secondary | ICD-10-CM | POA: Diagnosis not present

## 2024-02-10 ENCOUNTER — Telehealth: Payer: Self-pay

## 2024-02-10 ENCOUNTER — Encounter: Payer: Self-pay | Admitting: Oncology

## 2024-02-10 ENCOUNTER — Ambulatory Visit
Admission: RE | Admit: 2024-02-10 | Discharge: 2024-02-10 | Disposition: A | Discharge status: Home / Self Care | Payer: 59 | Source: Ambulatory Visit | Attending: Cardiovascular Disease | Admitting: Cardiovascular Disease

## 2024-02-10 DIAGNOSIS — R072 Precordial pain: Secondary | ICD-10-CM | POA: Diagnosis not present

## 2024-02-10 DIAGNOSIS — R079 Chest pain, unspecified: Secondary | ICD-10-CM | POA: Diagnosis not present

## 2024-02-10 MED ORDER — NITROGLYCERIN 0.4 MG SL SUBL
0.8000 mg | SUBLINGUAL_TABLET | Freq: Once | SUBLINGUAL | Status: AC
  Administered 2024-02-10: 0.8 mg via SUBLINGUAL

## 2024-02-10 MED ORDER — METOPROLOL TARTRATE 5 MG/5ML IV SOLN: 10.0000 mg | Freq: Once | INTRAVENOUS | Status: DC | PRN

## 2024-02-10 MED ORDER — IOHEXOL 350 MG/ML SOLN
80.0000 mL | Freq: Once | INTRAVENOUS | Status: AC | PRN
  Administered 2024-02-10: 65 mL via INTRAVENOUS

## 2024-02-10 NOTE — Telephone Encounter (Signed)
Verbal orders

## 2024-02-10 NOTE — Progress Notes (Signed)
 Patient tolerated procedure well. Ambulate w/o difficulty. Denies any lightheadedness or being dizzy. Pt denies any pain at this time. Sitting in chair. Pt is encouraged to drink additional water throughout the day and reason explained to patient. Patient verbalized understanding and all questions answered. ABC intact. No further needs at this time. Discharge from procedure area w/o issues.

## 2024-02-10 NOTE — Telephone Encounter (Signed)
 Copied from CRM [id number]. Topic: Clinical - Home Health Verbal Orders >> Feb 10, 2024 10:46 AM Nada Libman H wrote: Caller/Agency: Lucilla Lame Number: [phone] Service Requested: Home Health Frequency: 1x a week for 3 weeks and every other week for 4 weeks Any new concerns about the patient? Yes Has questions about medications and if she should be taking it because the patient does not have them at home//

## 2024-02-10 NOTE — Telephone Encounter (Signed)
Ok for verbal orders.    Andreya Lacks Simmons-Robinson, MD  [facility]  

## 2024-02-11 ENCOUNTER — Encounter: Payer: Self-pay | Admitting: Oncology

## 2024-02-11 ENCOUNTER — Encounter: Payer: Self-pay | Admitting: Cardiovascular Disease

## 2024-02-11 ENCOUNTER — Telehealth: Payer: Self-pay | Admitting: Family Medicine

## 2024-02-11 DIAGNOSIS — M1990 Unspecified osteoarthritis, unspecified site: Secondary | ICD-10-CM | POA: Diagnosis not present

## 2024-02-11 DIAGNOSIS — E041 Nontoxic single thyroid nodule: Secondary | ICD-10-CM | POA: Diagnosis not present

## 2024-02-11 DIAGNOSIS — Z87891 Personal history of nicotine dependence: Secondary | ICD-10-CM | POA: Diagnosis not present

## 2024-02-11 DIAGNOSIS — G20A1 Parkinson's disease without dyskinesia, without mention of fluctuations: Secondary | ICD-10-CM | POA: Diagnosis not present

## 2024-02-11 DIAGNOSIS — G8929 Other chronic pain: Secondary | ICD-10-CM | POA: Diagnosis not present

## 2024-02-11 DIAGNOSIS — Z8673 Personal history of transient ischemic attack (TIA), and cerebral infarction without residual deficits: Secondary | ICD-10-CM | POA: Diagnosis not present

## 2024-02-11 DIAGNOSIS — G4700 Insomnia, unspecified: Secondary | ICD-10-CM | POA: Diagnosis not present

## 2024-02-11 DIAGNOSIS — M545 Low back pain, unspecified: Secondary | ICD-10-CM | POA: Diagnosis not present

## 2024-02-11 DIAGNOSIS — R634 Abnormal weight loss: Secondary | ICD-10-CM | POA: Diagnosis not present

## 2024-02-11 DIAGNOSIS — I959 Hypotension, unspecified: Secondary | ICD-10-CM | POA: Diagnosis not present

## 2024-02-11 DIAGNOSIS — I10 Essential (primary) hypertension: Secondary | ICD-10-CM | POA: Diagnosis not present

## 2024-02-11 DIAGNOSIS — E538 Deficiency of other specified B group vitamins: Secondary | ICD-10-CM | POA: Diagnosis not present

## 2024-02-11 DIAGNOSIS — E119 Type 2 diabetes mellitus without complications: Secondary | ICD-10-CM | POA: Diagnosis not present

## 2024-02-11 NOTE — Telephone Encounter (Signed)
Ok for verbal orders.    Andreya Lacks Simmons-Robinson, MD  [facility]  

## 2024-02-11 NOTE — Telephone Encounter (Signed)
 Copied from CRM [id number]. Topic: Clinical - Home Health Verbal Orders >> Feb 11, 2024 12:44 PM Gery Pray wrote: Caller/Agency: Kreg Shropshire [facility] Callback Number: [phone] Service Requested: Physical Therapy Frequency: 1 a week for 9 weeks Any new concerns about the patient? No

## 2024-02-12 ENCOUNTER — Telehealth: Payer: Self-pay

## 2024-02-12 DIAGNOSIS — I10 Essential (primary) hypertension: Secondary | ICD-10-CM | POA: Diagnosis not present

## 2024-02-12 DIAGNOSIS — M1990 Unspecified osteoarthritis, unspecified site: Secondary | ICD-10-CM | POA: Diagnosis not present

## 2024-02-12 DIAGNOSIS — E538 Deficiency of other specified B group vitamins: Secondary | ICD-10-CM | POA: Diagnosis not present

## 2024-02-12 DIAGNOSIS — G8929 Other chronic pain: Secondary | ICD-10-CM | POA: Diagnosis not present

## 2024-02-12 DIAGNOSIS — Z8673 Personal history of transient ischemic attack (TIA), and cerebral infarction without residual deficits: Secondary | ICD-10-CM | POA: Diagnosis not present

## 2024-02-12 DIAGNOSIS — R634 Abnormal weight loss: Secondary | ICD-10-CM | POA: Diagnosis not present

## 2024-02-12 DIAGNOSIS — G4700 Insomnia, unspecified: Secondary | ICD-10-CM | POA: Diagnosis not present

## 2024-02-12 DIAGNOSIS — I959 Hypotension, unspecified: Secondary | ICD-10-CM | POA: Diagnosis not present

## 2024-02-12 DIAGNOSIS — Z87891 Personal history of nicotine dependence: Secondary | ICD-10-CM | POA: Diagnosis not present

## 2024-02-12 DIAGNOSIS — E119 Type 2 diabetes mellitus without complications: Secondary | ICD-10-CM | POA: Diagnosis not present

## 2024-02-12 DIAGNOSIS — M545 Low back pain, unspecified: Secondary | ICD-10-CM | POA: Diagnosis not present

## 2024-02-12 DIAGNOSIS — E041 Nontoxic single thyroid nodule: Secondary | ICD-10-CM | POA: Diagnosis not present

## 2024-02-12 DIAGNOSIS — G20A1 Parkinson's disease without dyskinesia, without mention of fluctuations: Secondary | ICD-10-CM | POA: Diagnosis not present

## 2024-02-12 NOTE — Telephone Encounter (Signed)
 Copied from CRM [id number]. Topic: Clinical - Home Health Verbal Orders >> Feb 12, 2024  1:24 PM Martha Clan wrote: Caller/AgencyRosita Fire Number: [phone] Service Requested: Occupational Therapy Frequency: 1 Time a week for 4 weeks Any new concerns about the patient? No

## 2024-02-12 NOTE — Telephone Encounter (Signed)
Ok for verbal orders.    Andreya Lacks Simmons-Robinson, MD  [facility]  

## 2024-02-12 NOTE — Telephone Encounter (Signed)
Verbals given on secure line

## 2024-02-13 ENCOUNTER — Encounter: Payer: Self-pay | Admitting: Pain Medicine

## 2024-02-13 ENCOUNTER — Ambulatory Visit: Payer: 59 | Attending: Pain Medicine | Admitting: Pain Medicine

## 2024-02-13 VITALS — BP 131/65 | HR 68 | Temp 97.3°F | Resp 16 | Ht 63.0 in | Wt 147.0 lb

## 2024-02-13 DIAGNOSIS — M5416 Radiculopathy, lumbar region: Secondary | ICD-10-CM | POA: Diagnosis not present

## 2024-02-13 DIAGNOSIS — Z87891 Personal history of nicotine dependence: Secondary | ICD-10-CM | POA: Diagnosis not present

## 2024-02-13 DIAGNOSIS — Z8673 Personal history of transient ischemic attack (TIA), and cerebral infarction without residual deficits: Secondary | ICD-10-CM | POA: Diagnosis not present

## 2024-02-13 DIAGNOSIS — Z09 Encounter for follow-up examination after completed treatment for conditions other than malignant neoplasm: Secondary | ICD-10-CM | POA: Diagnosis not present

## 2024-02-13 DIAGNOSIS — M5410 Radiculopathy, site unspecified: Secondary | ICD-10-CM | POA: Insufficient documentation

## 2024-02-13 DIAGNOSIS — M1990 Unspecified osteoarthritis, unspecified site: Secondary | ICD-10-CM | POA: Diagnosis not present

## 2024-02-13 DIAGNOSIS — R634 Abnormal weight loss: Secondary | ICD-10-CM | POA: Diagnosis not present

## 2024-02-13 DIAGNOSIS — E119 Type 2 diabetes mellitus without complications: Secondary | ICD-10-CM | POA: Diagnosis not present

## 2024-02-13 DIAGNOSIS — M545 Low back pain, unspecified: Secondary | ICD-10-CM | POA: Diagnosis not present

## 2024-02-13 DIAGNOSIS — G20A1 Parkinson's disease without dyskinesia, without mention of fluctuations: Secondary | ICD-10-CM | POA: Diagnosis not present

## 2024-02-13 DIAGNOSIS — M79604 Pain in right leg: Secondary | ICD-10-CM | POA: Diagnosis not present

## 2024-02-13 DIAGNOSIS — E538 Deficiency of other specified B group vitamins: Secondary | ICD-10-CM | POA: Diagnosis not present

## 2024-02-13 DIAGNOSIS — I959 Hypotension, unspecified: Secondary | ICD-10-CM | POA: Diagnosis not present

## 2024-02-13 DIAGNOSIS — E041 Nontoxic single thyroid nodule: Secondary | ICD-10-CM | POA: Diagnosis not present

## 2024-02-13 DIAGNOSIS — G8929 Other chronic pain: Secondary | ICD-10-CM | POA: Diagnosis not present

## 2024-02-13 DIAGNOSIS — I10 Essential (primary) hypertension: Secondary | ICD-10-CM | POA: Diagnosis not present

## 2024-02-13 DIAGNOSIS — G4700 Insomnia, unspecified: Secondary | ICD-10-CM | POA: Diagnosis not present

## 2024-02-13 NOTE — Telephone Encounter (Signed)
 Attempted to call phone number listed in CRM. Received VM identified only as [name] (sounded more like a Field seismologist) and did not identify as a secure voicemail or even that it is a work Set designer.  The documented caller is the patient and patient's phone number.   I left a very generic VM that if Mr. Julie Acevedo is trying to reach out office about plans for a mutual patient, please give [place] a call back.

## 2024-02-13 NOTE — Progress Notes (Signed)
 PROVIDER NOTE: Information contained herein reflects review and annotations entered in association with encounter. Interpretation of such information and data should be left to medically-trained personnel. Information provided to patient can be located elsewhere in the medical record under "Patient Instructions". Document created using STT-dictation technology, any transcriptional errors that may result from process are unintentional.    Patient: Julie Acevedo  Service Category: E/M  Provider: Oswaldo Done, MD  DOB: [birth year]  DOS: 02/13/2024  Referring Provider: Brett Albino*  MRN: [id number]  Specialty: Interventional Pain Management  PCP: Ronnald Ramp, MD  Type: Established Patient  Setting: Ambulatory outpatient    Location: Office  Delivery: Face-to-face     HPI  [name], a 73 y.o. year old female, is here today because of her Chronic pain of right lower extremity [M79.604, G89.29]. [name] primary complain today is Back Pain  Pertinent problems: Ms. Aspinwall has Arthritis; Narrowing of intervertebral disc space; Chronic low back pain of over 3 months duration; Lumbosacral radiculopathy at S1 (Left); Arthritis of knee, degenerative; Failed back surgical syndrome; Acquired trigger finger; Low back pain radiating to lower extremity (Right); PTTD (posterior tibial tendon dysfunction); Chronic pain syndrome; Abnormal CT scan, cervical spine (11/19/2022); Abnormal MRI, lumbar spine (09/25/2023); DDD (degenerative disc disease), cervical; DDD (degenerative disc disease), lumbar; Cervical foraminal stenosis (C3-C7); Chronic low back pain (1ry area of Pain) (Bilateral) (R>L) w/o sciatica; Chronic lower extremity pain (2ry area of Pain) (Right); Osteoarthritis of shoulder (Right); Osteoarthritis of knees (Bilateral); Chronic hip pain (3ry area of Pain) (Right); Lumbar facet joint pain; Lumbar facet joint syndrome; Arthropathy of hip (Right); Osteoarthritis of hip  (Right); 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 Pain Assessment: Severity of Chronic pain is reported as a 4 /10. Location: Back Lower, Right/Radaites from right lower back down outer right thigh to right knee. Onset: More than a month ago. Quality: Constant, Dull. Timing: Constant. Modifying factor(s): Procedures. Vitals:  height is 5\' 3"  (1.6 m) and weight is 147 lb (66.7 kg). Her temporal temperature is 97.3 F (36.3 C) (abnormal). Her blood pressure is 131/65 and her pulse is 68. Her respiration is 16 and oxygen saturation is 100%.  BMI: Estimated body mass index is 26.04 kg/m as calculated from the following:   Height as of this encounter: 5\' 3"  (1.6 m).   Weight as of this encounter: 147 lb (66.7 kg). Last encounter: 01/22/2024. Last procedure: 01/30/2024.  Reason for encounter: post-procedure evaluation and assessment. In addition, the diagnostic x-rays of the left knee that she had done on 01/22/2024 were finally read on 02/05/2024.  (01/22/2024) DIAGNOSTIC X-RAYS OF LEFT KNEE IMPRESSION: 1. Small suprapatellar bursal effusion. 2. Osteopenia and degenerative change without evidence of fractures. 3. Peripheral vascular disease.  Discussed the use of AI scribe software for clinical note transcription with the patient, who gave verbal consent to proceed.  History of Present Illness   Julie Acevedo is a 73 year old female who presents for follow-up after a midline to left caudal epidural steroid injection.  She experienced immediate relief from  leg and back pain following the procedure, which lasted until the numbness subsided during the  night.  Currently, she reports approximately 75% improvement in her symptoms, with the pain not returning to its previous intensity.  She has discontinued the use of her cane, indicating a significant improvement in her mobility and daily activities.      Post-procedure evaluation   Procedure: Caudal Epidural Steroid Injection + Epidurogram   #1  Laterality: Midline aiming left Level: Sacrococcygeal ligament  Imaging: Fluoroscopy-guided Spinal ([id number]) Anesthesia: Local anesthesia (1-2% Lidocaine) Premedication: On 01/22/2024 the patient was provided with instructions for contrast allergy premedication protocol with prednisone and diphenhydramine.  (01/30/2024 at 8:11 AM) patient confirmed to have taken prednisone and diphenhydramine as ordered. Anxiolysis: IV Versed 2.0 mg Sedation: Moderate Sedation None required. No Fentanyl administered.         DOS: 01/30/2024  Performed by: Oswaldo Done, MD  Purpose: Diagnostic/Therapeutic Indications: Low back and lower extremity pain severe enough to impact quality of life or function. Rationale (medical necessity): procedure needed and proper for the diagnosis and/or treatment of Julie Acevedo's medical symptoms and needs. 1. Chronic low back pain (1ry area of Pain) (Bilateral) (R>L) w/o sciatica   2. Chronic low back pain of over 3 months duration   3. Chronic lower extremity pain (2ry area of Pain) (Right)   4. Failed back surgical syndrome   5. Degeneration of intervertebral disc of lumbar region with discogenic back pain and lower extremity pain   6. Lumbar radiculopathy (S1) (Left)   7. Lumbosacral radiculopathy at S1 (Left)   8. Radicular pain of lower extremity (Left)   9. History of allergy to radiographic contrast media    NAS-11 Pain score:   Pre-procedure: 8 /10   Post-procedure: 0-No pain/10     Effectiveness:  Initial hour  after procedure: 100 %. Subsequent 4-6 hours post-procedure: 100 %. Analgesia past initial 6 hours: 75 % (No longer needs to use her cane since procedure). Ongoing improvement:  Analgesic: The patient indicates currently having an 80 to 95% improvement of her leg pain and low back pain to the point where she no longer has to use a cane to ambulate. Function: Julie Acevedo reports improvement in function ROM: Julie Acevedo reports improvement in ROM    Pharmacotherapy Assessment  Analgesic: No chronic opioid analgesics therapy prescribed by our practice. None MME/day: 0 mg/day   Monitoring: [place] PMP: PDMP reviewed during this encounter.       Pharmacotherapy: No side-effects or adverse reactions reported. Compliance: No problems identified. Effectiveness: Clinically acceptable.  No notes on file  No results found for: "CBDTHCR" No results found for: "D8THCCBX" No results found for: "D9THCCBX"  UDS:  Summary  Date Value Ref Range Status  06/19/2023 Note  Final    Comment:    ==================================================================== Compliance Drug Analysis, Ur ==================================================================== Test                             Result       Flag       Units  Drug Present and Declared for Prescription Verification   Gabapentin                     PRESENT      EXPECTED   Mirtazapine                    PRESENT      EXPECTED   Hydroxyzine  PRESENT      EXPECTED   Metoprolol                     PRESENT      EXPECTED  Drug Absent but Declared for Prescription Verification   Levetiracetam                  Not Detected UNEXPECTED ==================================================================== Test                      Result    Flag   Units      Ref Range   Creatinine              60               mg/dL      >=45 ==================================================================== Declared Medications:  The flagging and  interpretation on this report are based on the  following declared medications.  Unexpected results may arise from  inaccuracies in the declared medications.   **Note: The testing scope of this panel includes these medications:   Gabapentin (Neurontin)  Hydroxyzine  Levetiracetam (Keppra)  Metoprolol  Mirtazapine (Remeron)   **Note: The testing scope of this panel does not include the  following reported medications:   Atorvastatin  Calcitriol  Diazoxide (Proglycem)  Linaclotide (Linzess)  Rosuvastatin (Crestor)  Triamcinolone (Kenalog) ==================================================================== For clinical consultation, please call [phone]. ====================================================================       ROS  Constitutional: Denies any fever or chills Gastrointestinal: No reported hemesis, hematochezia, vomiting, or acute GI distress Musculoskeletal: Denies any acute onset joint swelling, redness, loss of ROM, or weakness Neurological: No reported episodes of acute onset apraxia, aphasia, dysarthria, agnosia, amnesia, paralysis, loss of coordination, or loss of consciousness  Medication Review  Accu-Chek Softclix Lancets, atorvastatin, blood glucose meter kit and supplies, calcitRIOL, diazoxide, diphenhydrAMINE, gabapentin, glucose blood, hydrOXYzine, ibuprofen, levETIRAcetam, linaclotide, metoprolol succinate, mirtazapine, omeprazole, ondansetron, rivastigmine, rosuvastatin, traZODone, and triamcinolone cream  History Review  Allergy: Julie Acevedo is allergic to lotrel [amlodipine besy-benazepril hcl], contrast media [iodinated contrast media], niacin, and sulfa antibiotics. Drug: Julie Acevedo  reports no history of drug use. Alcohol:  reports no history of alcohol use. Tobacco:  reports that she has never smoked. She has never used smokeless tobacco. Social: Julie Acevedo  reports that she has never smoked. She has never used smokeless tobacco. She  reports that she does not drink alcohol and does not use drugs. Medical:  has a past medical history of Anemia, Arthritis, COVID-19 (11/19/2019), Diverticulitis, DM (diabetes mellitus) (HCC), History of methicillin resistant staphylococcus aureus (MRSA) (2017), HTN (hypertension), Hyperlipidemia, L-S radiculopathy (06/11/2015), Left leg swelling (01/22/2023), Lower abdominal pain (08/08/2022), Memory difficulties (09/01/2015), Multiple thyroid nodules, Myocardial infarction (HCC) (1995), Partial small bowel obstruction (HCC) (04/23/2016), SBO (small bowel obstruction) (HCC), Short-term memory loss, and Thyroid nodule. Surgical: Ms. Windom  has a past surgical history that includes Hemicolectomy; Lumbar disc surgery; Roux-en-Y Gastric Bypass (2010); Cholecystectomy; Eye surgery; Hand surgery; Foot surgery; Abdominal hysterectomy; Tonsillectomy; Upper gi endoscopy (01/12/2016); Cardiac catheterization (2000); Laparoscopic lysis of adhesions (N/A, 08/21/2016); Ventral hernia repair (N/A, 08/21/2016); Insertion of mesh (N/A, 08/21/2016); Cystocele repair (N/A, 03/19/2017); Cystoscopy (N/A, 03/19/2017); Laparoscopic removal abdominal mass; LOOP RECORDER INSERTION (N/A, 03/21/2018); laparotomy (N/A, 04/16/2019); XI robotic assisted ventral hernia (N/A, 12/08/2019); Insertion of mesh (N/A, 12/08/2019); Breast excisional biopsy (Right, 1971); Small intestine surgery; and Esophagogastroduodenoscopy (egd) with propofol (N/A, 12/15/2021). Family: family history includes Breast cancer in her paternal grandmother; Breast cancer (age of  onset: 64) in her paternal aunt; Breast cancer (age of onset: 56) in her paternal aunt; COPD in her mother; Cancer in her paternal aunt and paternal grandmother; Cancer (age of onset: 28) in her father; Congestive Heart Failure in her maternal grandmother; Coronary artery disease (age of onset: 65) in her father; Diabetes in her mother; Emphysema in her maternal grandfather; Heart attack in her  paternal grandfather; Hypertension in her mother.  Laboratory Chemistry Profile   Renal Lab Results  Component Value Date   BUN 10 01/28/2024   CREATININE 1.32 (H) 01/28/2024   BCR 8 (L) 01/28/2024   GFRAA 42 (L) 12/05/2020   GFRNONAA 42 (L) 06/19/2023    Hepatic Lab Results  Component Value Date   AST 61 (H) 06/19/2023   ALT 53 (H) 06/19/2023   ALBUMIN 4.0 06/19/2023   ALKPHOS 209 (H) 06/19/2023   LIPASE 27 08/31/2022   AMMONIA 68 (H) 11/15/2012    Electrolytes Lab Results  Component Value Date   NA 144 01/28/2024   K 4.2 01/28/2024   CL 107 (H) 01/28/2024   CALCIUM 9.6 01/28/2024   MG 2.2 06/19/2023   PHOS 3.4 11/20/2022    Bone Lab Results  Component Value Date   VD25OH 51.23 08/09/2022   25OHVITD1 39 06/19/2023   25OHVITD2 1.0 06/19/2023   25OHVITD3 38 06/19/2023    Inflammation (CRP: Acute Phase) (ESR: Chronic Phase) Lab Results  Component Value Date   CRP <0.5 06/19/2023   ESRSEDRATE 40 (H) 06/19/2023   LATICACIDVEN 1.1 08/21/2022         Note: Above Lab results reviewed.  Recent Imaging Review  CT CORONARY MORPH W/CTA COR W/SCORE W/CA W/CM &/OR WO/CM CLINICAL DATA:  Chest pain  EXAM: Cardiac/Coronary  CTA  TECHNIQUE: The patient was scanned on a Siemens Somatom scanner.  : A retrospective scan was triggered in the ascending thoracic aorta. Axial non-contrast 3 mm slices were carried out through the heart. The data set was analyzed on a dedicated work station and scored using the Agatson method. Gantry rotation speed was 66 msecs and collimation was .6 mm. 25 mg of metoprolol and 0.8 mg of sl NTG was given. The 3D data set was reconstructed in 5% intervals of the 60-95 % of the R-R cycle. Diastolic phases were analyzed on a dedicated work station using MPR, MIP and VRT modes. The patient received 80 cc of contrast.  FINDINGS: Aorta:  Normal size.  No calcifications.  No dissection.  Aortic Valve:  Trileaflet.  No  calcifications.  Coronary Arteries:  Normal coronary origin.  Right dominance.  RCA is a dominant artery. There is no plaque.  Left main gives rise to LAD and LCX arteries. LM has no disease.  LAD has no plaque.  LCX is a non-dominant artery.  There is no plaque.  Other findings:  Normal pulmonary vein drainage into the left atrium.  Normal left atrial appendage without a thrombus.  Normal size of the pulmonary artery.  IMPRESSION: 1. Coronary calcium score of 0.  2. Normal coronary origin with right dominance.  3. No evidence of CAD.  4. CAD-RADS 0. Consider non-atherosclerotic causes of chest pain.  Electronically Signed   By: Debbe Odea M.D.   On: 02/10/2024 13:31 Note: Reviewed        Physical Exam  General appearance: Well nourished, well developed, and well hydrated. In no apparent acute distress Mental status: Alert, oriented x 3 (person, place, & time)       Respiratory:  No evidence of acute respiratory distress Eyes: PERLA Vitals: BP 131/65 (Patient Position: Sitting, Cuff Size: Normal)   Pulse 68   Temp (!) 97.3 F (36.3 C) (Temporal)   Resp 16   Ht 5\' 3"  (1.6 m)   Wt 147 lb (66.7 kg)   SpO2 100%   BMI 26.04 kg/m  BMI: Estimated body mass index is 26.04 kg/m as calculated from the following:   Height as of this encounter: 5\' 3"  (1.6 m).   Weight as of this encounter: 147 lb (66.7 kg). Ideal: Ideal body weight: 52.4 kg (115 lb 8.3 oz) Adjusted ideal body weight: 58.1 kg (128 lb 1.8 oz)  Assessment   Diagnosis Status  1. Chronic lower extremity pain (2ry area of Pain) (Right)   2. Low back pain radiating to lower extremity (Right)   3. Chronic low back pain of over 3 months duration   4. Lumbar radiculopathy (S1) (Left)   5. Radicular pain of lower extremity (Left)   6. Postop check    Controlled Controlled Controlled   Updated Problems: No problems updated.  Plan of Care  Problem-specific:  Assessment and Plan    Lumbar  Radiculopathy Following a midline to left caudal epidural steroid injection on 01/30/24, there is a 75% improvement in leg and back pain, and no longer using a cane. Scar tissue and swelling were noted during the procedure. Currently satisfied with pain relief, but discussed the potential for pain recurrence and alternative procedures for longer-lasting benefit. Monitor symptoms and contact the clinic if pain returns. Consider repeating the epidural steroid injection if pain recurs. Discuss alternative procedures if symptoms persist. Place on a PRN follow-up schedule and instruct to call the clinic if symptoms return or worsen.       Julie Acevedo has a current medication list which includes the following long-term medication(s): accu-chek guide, diphenhydramine, gabapentin, levetiracetam, linzess, metoprolol succinate, mirtazapine, omeprazole, and rosuvastatin.  Pharmacotherapy (Medications Ordered): No orders of the defined types were placed in this encounter.  Orders:  Orders Placed This Encounter  Procedures   Nursing Instructions:    Please complete this patient's postprocedure evaluation.    Scheduling Instructions:     Please complete this patient's postprocedure evaluation.   Follow-up plan:   No follow-ups on file.      Interventional Therapies  Risk Factors  Considerations:  Cardiologist: Dorothyann Peng, MD ([facility])  Neurologist: Cristopher Peru, MD [facility] neurology) Hx of MI (1995)  Hx of TIAs  T2DM  Stage 3b CKD  [name]  Memory impairment  Hx of Bowel Obstruction (04/2019)  GERD  s/p Gastric Bypass for obesity  Malnutrition-malabsorption  Hx. Seizures      Planned  Pending:   Diagnostic left caudal ESI #1 (contrast allergy protocol ordered)    Under consideration:   Diagnostic left caudal ESI #1  Diagnostic bilateral lumbar facet MBB #2  Diagnostic/therapeutic right IA hip joint injection #1    Completed:   Diagnostic bilateral  lumbar facet MBB x1 (09/10/2023) (100/100/80 x 1 month)    Therapeutic  Palliative (PRN) options:   None established   Completed by other providers:   Therapeutic/diagnostic right L5 TFESI x2 (04/11/2016, 05/02/2016) by Merri Ray, DO [facility])  Therapeutic/diagnostic right S1 TFESI x2 (04/11/2016, 05/02/2016) by Merri Ray, DO [facility])  Therapeutic/diagnostic right L3 TFESI x1 (07/19/2016) by Merri Ray, DO [facility])       Recent Visits Date Type Provider Dept  01/30/24 Procedure visit Laban Emperor,  Para March, MD [facility]  01/22/24 Office Visit Delano Metz, MD [facility]  Showing recent visits within past 90 days and meeting all other requirements Today's Visits Date Type Provider Dept  02/13/24 Office Visit Delano Metz, MD [facility]  Showing today's visits and meeting all other requirements Future Appointments No visits were found meeting these conditions. Showing future appointments within next 90 days and meeting all other requirements  I discussed the assessment and treatment plan with the patient. The patient was provided an opportunity to ask questions and all were answered. The patient agreed with the plan and demonstrated an understanding of the instructions.  Patient advised to call back or seek an in-person evaluation if the symptoms or condition worsens.  Duration of encounter: 36 minutes.  Total time on encounter, as per AMA guidelines included both the face-to-face and non-face-to-face time personally spent by the physician and/or other qualified health care professional(s) on the day of the encounter (includes time in activities that require the physician or other qualified health care professional and does not include time in activities normally performed by clinical staff). Physician's time may include the following activities when performed: Preparing to see the patient (e.g., pre-charting review of records,  searching for previously ordered imaging, lab work, and nerve conduction tests) Review of prior analgesic pharmacotherapies. Reviewing PMP Interpreting ordered tests (e.g., lab work, imaging, nerve conduction tests) Performing post-procedure evaluations, including interpretation of diagnostic procedures Obtaining and/or reviewing separately obtained history Performing a medically appropriate examination and/or evaluation Counseling and educating the patient/family/caregiver Ordering medications, tests, or procedures Referring and communicating with other health care professionals (when not separately reported) Documenting clinical information in the electronic or other health record Independently interpreting results (not separately reported) and communicating results to the patient/ family/caregiver Care coordination (not separately reported)  Note by: Oswaldo Done, MD Date: 02/13/2024; Time: 3:44 PM

## 2024-02-14 NOTE — Telephone Encounter (Signed)
 Attempted to contact [facility] at the number provided, mailbox was full wasn't able to leave a message

## 2024-02-19 DIAGNOSIS — R634 Abnormal weight loss: Secondary | ICD-10-CM | POA: Diagnosis not present

## 2024-02-19 DIAGNOSIS — M1990 Unspecified osteoarthritis, unspecified site: Secondary | ICD-10-CM | POA: Diagnosis not present

## 2024-02-19 DIAGNOSIS — M545 Low back pain, unspecified: Secondary | ICD-10-CM | POA: Diagnosis not present

## 2024-02-19 DIAGNOSIS — G8929 Other chronic pain: Secondary | ICD-10-CM | POA: Diagnosis not present

## 2024-02-19 DIAGNOSIS — I10 Essential (primary) hypertension: Secondary | ICD-10-CM | POA: Diagnosis not present

## 2024-02-19 DIAGNOSIS — G4700 Insomnia, unspecified: Secondary | ICD-10-CM | POA: Diagnosis not present

## 2024-02-19 DIAGNOSIS — E041 Nontoxic single thyroid nodule: Secondary | ICD-10-CM | POA: Diagnosis not present

## 2024-02-19 DIAGNOSIS — Z87891 Personal history of nicotine dependence: Secondary | ICD-10-CM | POA: Diagnosis not present

## 2024-02-19 DIAGNOSIS — G20A1 Parkinson's disease without dyskinesia, without mention of fluctuations: Secondary | ICD-10-CM | POA: Diagnosis not present

## 2024-02-19 DIAGNOSIS — I959 Hypotension, unspecified: Secondary | ICD-10-CM | POA: Diagnosis not present

## 2024-02-19 DIAGNOSIS — E538 Deficiency of other specified B group vitamins: Secondary | ICD-10-CM | POA: Diagnosis not present

## 2024-02-19 DIAGNOSIS — E119 Type 2 diabetes mellitus without complications: Secondary | ICD-10-CM | POA: Diagnosis not present

## 2024-02-19 DIAGNOSIS — Z8673 Personal history of transient ischemic attack (TIA), and cerebral infarction without residual deficits: Secondary | ICD-10-CM | POA: Diagnosis not present

## 2024-02-20 DIAGNOSIS — G8929 Other chronic pain: Secondary | ICD-10-CM | POA: Diagnosis not present

## 2024-02-20 DIAGNOSIS — R634 Abnormal weight loss: Secondary | ICD-10-CM | POA: Diagnosis not present

## 2024-02-20 DIAGNOSIS — Z87891 Personal history of nicotine dependence: Secondary | ICD-10-CM | POA: Diagnosis not present

## 2024-02-20 DIAGNOSIS — G4700 Insomnia, unspecified: Secondary | ICD-10-CM | POA: Diagnosis not present

## 2024-02-20 DIAGNOSIS — I959 Hypotension, unspecified: Secondary | ICD-10-CM | POA: Diagnosis not present

## 2024-02-20 DIAGNOSIS — M1990 Unspecified osteoarthritis, unspecified site: Secondary | ICD-10-CM | POA: Diagnosis not present

## 2024-02-20 DIAGNOSIS — G20A1 Parkinson's disease without dyskinesia, without mention of fluctuations: Secondary | ICD-10-CM | POA: Diagnosis not present

## 2024-02-20 DIAGNOSIS — Z8673 Personal history of transient ischemic attack (TIA), and cerebral infarction without residual deficits: Secondary | ICD-10-CM | POA: Diagnosis not present

## 2024-02-20 DIAGNOSIS — I10 Essential (primary) hypertension: Secondary | ICD-10-CM | POA: Diagnosis not present

## 2024-02-20 DIAGNOSIS — E119 Type 2 diabetes mellitus without complications: Secondary | ICD-10-CM | POA: Diagnosis not present

## 2024-02-20 DIAGNOSIS — M545 Low back pain, unspecified: Secondary | ICD-10-CM | POA: Diagnosis not present

## 2024-02-20 DIAGNOSIS — E041 Nontoxic single thyroid nodule: Secondary | ICD-10-CM | POA: Diagnosis not present

## 2024-02-20 DIAGNOSIS — E538 Deficiency of other specified B group vitamins: Secondary | ICD-10-CM | POA: Diagnosis not present

## 2024-02-21 DIAGNOSIS — G4700 Insomnia, unspecified: Secondary | ICD-10-CM | POA: Diagnosis not present

## 2024-02-21 DIAGNOSIS — I959 Hypotension, unspecified: Secondary | ICD-10-CM | POA: Diagnosis not present

## 2024-02-21 DIAGNOSIS — Z87891 Personal history of nicotine dependence: Secondary | ICD-10-CM | POA: Diagnosis not present

## 2024-02-21 DIAGNOSIS — E538 Deficiency of other specified B group vitamins: Secondary | ICD-10-CM | POA: Diagnosis not present

## 2024-02-21 DIAGNOSIS — E119 Type 2 diabetes mellitus without complications: Secondary | ICD-10-CM | POA: Diagnosis not present

## 2024-02-21 DIAGNOSIS — G20A1 Parkinson's disease without dyskinesia, without mention of fluctuations: Secondary | ICD-10-CM | POA: Diagnosis not present

## 2024-02-21 DIAGNOSIS — Z8673 Personal history of transient ischemic attack (TIA), and cerebral infarction without residual deficits: Secondary | ICD-10-CM | POA: Diagnosis not present

## 2024-02-21 DIAGNOSIS — R634 Abnormal weight loss: Secondary | ICD-10-CM | POA: Diagnosis not present

## 2024-02-21 DIAGNOSIS — E041 Nontoxic single thyroid nodule: Secondary | ICD-10-CM | POA: Diagnosis not present

## 2024-02-21 DIAGNOSIS — M545 Low back pain, unspecified: Secondary | ICD-10-CM | POA: Diagnosis not present

## 2024-02-21 DIAGNOSIS — M1990 Unspecified osteoarthritis, unspecified site: Secondary | ICD-10-CM | POA: Diagnosis not present

## 2024-02-21 DIAGNOSIS — I10 Essential (primary) hypertension: Secondary | ICD-10-CM | POA: Diagnosis not present

## 2024-02-21 DIAGNOSIS — G8929 Other chronic pain: Secondary | ICD-10-CM | POA: Diagnosis not present

## 2024-02-25 ENCOUNTER — Other Ambulatory Visit: Payer: Self-pay | Admitting: Physician Assistant

## 2024-02-25 DIAGNOSIS — E041 Nontoxic single thyroid nodule: Secondary | ICD-10-CM | POA: Diagnosis not present

## 2024-02-25 DIAGNOSIS — E119 Type 2 diabetes mellitus without complications: Secondary | ICD-10-CM | POA: Diagnosis not present

## 2024-02-25 DIAGNOSIS — Z8673 Personal history of transient ischemic attack (TIA), and cerebral infarction without residual deficits: Secondary | ICD-10-CM | POA: Diagnosis not present

## 2024-02-25 DIAGNOSIS — G20A1 Parkinson's disease without dyskinesia, without mention of fluctuations: Secondary | ICD-10-CM | POA: Diagnosis not present

## 2024-02-25 DIAGNOSIS — I10 Essential (primary) hypertension: Secondary | ICD-10-CM

## 2024-02-25 DIAGNOSIS — G8929 Other chronic pain: Secondary | ICD-10-CM | POA: Diagnosis not present

## 2024-02-25 DIAGNOSIS — M1990 Unspecified osteoarthritis, unspecified site: Secondary | ICD-10-CM | POA: Diagnosis not present

## 2024-02-25 DIAGNOSIS — E538 Deficiency of other specified B group vitamins: Secondary | ICD-10-CM | POA: Diagnosis not present

## 2024-02-25 DIAGNOSIS — G4700 Insomnia, unspecified: Secondary | ICD-10-CM | POA: Diagnosis not present

## 2024-02-25 DIAGNOSIS — M545 Low back pain, unspecified: Secondary | ICD-10-CM | POA: Diagnosis not present

## 2024-02-25 DIAGNOSIS — R634 Abnormal weight loss: Secondary | ICD-10-CM | POA: Diagnosis not present

## 2024-02-25 DIAGNOSIS — I959 Hypotension, unspecified: Secondary | ICD-10-CM | POA: Diagnosis not present

## 2024-02-25 DIAGNOSIS — Z87891 Personal history of nicotine dependence: Secondary | ICD-10-CM | POA: Diagnosis not present

## 2024-02-26 DIAGNOSIS — G8929 Other chronic pain: Secondary | ICD-10-CM | POA: Diagnosis not present

## 2024-02-26 DIAGNOSIS — R634 Abnormal weight loss: Secondary | ICD-10-CM | POA: Diagnosis not present

## 2024-02-26 DIAGNOSIS — Z87891 Personal history of nicotine dependence: Secondary | ICD-10-CM | POA: Diagnosis not present

## 2024-02-26 DIAGNOSIS — M545 Low back pain, unspecified: Secondary | ICD-10-CM | POA: Diagnosis not present

## 2024-02-26 DIAGNOSIS — I10 Essential (primary) hypertension: Secondary | ICD-10-CM | POA: Diagnosis not present

## 2024-02-26 DIAGNOSIS — E538 Deficiency of other specified B group vitamins: Secondary | ICD-10-CM | POA: Diagnosis not present

## 2024-02-26 DIAGNOSIS — E119 Type 2 diabetes mellitus without complications: Secondary | ICD-10-CM | POA: Diagnosis not present

## 2024-02-26 DIAGNOSIS — Z8673 Personal history of transient ischemic attack (TIA), and cerebral infarction without residual deficits: Secondary | ICD-10-CM | POA: Diagnosis not present

## 2024-02-26 DIAGNOSIS — G4700 Insomnia, unspecified: Secondary | ICD-10-CM | POA: Diagnosis not present

## 2024-02-26 DIAGNOSIS — I959 Hypotension, unspecified: Secondary | ICD-10-CM | POA: Diagnosis not present

## 2024-02-26 DIAGNOSIS — E041 Nontoxic single thyroid nodule: Secondary | ICD-10-CM | POA: Diagnosis not present

## 2024-02-26 DIAGNOSIS — M1990 Unspecified osteoarthritis, unspecified site: Secondary | ICD-10-CM | POA: Diagnosis not present

## 2024-02-26 DIAGNOSIS — G20A1 Parkinson's disease without dyskinesia, without mention of fluctuations: Secondary | ICD-10-CM | POA: Diagnosis not present

## 2024-02-26 NOTE — Telephone Encounter (Signed)
 Requested Prescriptions  Pending Prescriptions Disp Refills   metoprolol succinate (TOPROL-XL) 25 MG 24 hr tablet [Pharmacy Med Name: METOPROLOL SUC ER 25MG  TAB 25 Tablet] 30 tablet 10    Sig: TAKE 1 TABLET BY MOUTH EVERY MORNING     Cardiovascular:  Beta Blockers Passed - 02/26/2024  1:31 PM      Passed - Last BP in normal range    BP Readings from Last 1 Encounters:  02/13/24 131/65         Passed - Last Heart Rate in normal range    Pulse Readings from Last 1 Encounters:  02/13/24 68         Passed - Valid encounter within last 6 months    Recent Outpatient Visits           1 month ago Essential hypertension   [place] [facility] [place], [place], [place]   9 months ago Type 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, without long-term current use of insulin (HCC)   [place] [facility] [place], [place], PA-C   10 months ago Other microscopic hematuria   [facility] [place], [place], PA-C   1 year ago Benign essential HTN   [place] [facility] [place], [place], [place]   1 year ago Left leg swelling   [place] [facility] [place], Tawanna Cool[place], MD

## 2024-03-02 ENCOUNTER — Ambulatory Visit: Payer: Self-pay | Admitting: Family Medicine

## 2024-03-02 DIAGNOSIS — Z87891 Personal history of nicotine dependence: Secondary | ICD-10-CM | POA: Diagnosis not present

## 2024-03-02 DIAGNOSIS — Z8673 Personal history of transient ischemic attack (TIA), and cerebral infarction without residual deficits: Secondary | ICD-10-CM | POA: Diagnosis not present

## 2024-03-02 DIAGNOSIS — G20A1 Parkinson's disease without dyskinesia, without mention of fluctuations: Secondary | ICD-10-CM | POA: Diagnosis not present

## 2024-03-02 DIAGNOSIS — R634 Abnormal weight loss: Secondary | ICD-10-CM | POA: Diagnosis not present

## 2024-03-02 DIAGNOSIS — M1990 Unspecified osteoarthritis, unspecified site: Secondary | ICD-10-CM | POA: Diagnosis not present

## 2024-03-02 DIAGNOSIS — G8929 Other chronic pain: Secondary | ICD-10-CM | POA: Diagnosis not present

## 2024-03-02 DIAGNOSIS — E041 Nontoxic single thyroid nodule: Secondary | ICD-10-CM | POA: Diagnosis not present

## 2024-03-02 DIAGNOSIS — I10 Essential (primary) hypertension: Secondary | ICD-10-CM | POA: Diagnosis not present

## 2024-03-02 DIAGNOSIS — G4700 Insomnia, unspecified: Secondary | ICD-10-CM | POA: Diagnosis not present

## 2024-03-02 DIAGNOSIS — M545 Low back pain, unspecified: Secondary | ICD-10-CM | POA: Diagnosis not present

## 2024-03-02 DIAGNOSIS — I959 Hypotension, unspecified: Secondary | ICD-10-CM | POA: Diagnosis not present

## 2024-03-02 DIAGNOSIS — E119 Type 2 diabetes mellitus without complications: Secondary | ICD-10-CM | POA: Diagnosis not present

## 2024-03-02 DIAGNOSIS — E538 Deficiency of other specified B group vitamins: Secondary | ICD-10-CM | POA: Diagnosis not present

## 2024-03-02 NOTE — Telephone Encounter (Signed)
 Chief Complaint: diarrhea Symptoms: loose stool Frequency: couple months   Disposition: [] ED /[] Urgent Care (no appt availability in office) / [x] Appointment(In office/virtual)/ []  [facility]/ [] Home Care/ [] Refused Recommended Disposition /[] [facility]/ []  Follow-up with PCP Additional Notes: Marchelle Folks, PTA from [facility], called with concerns of persistent diarrhea of pt.  Pt states it has been going on for several months and is related to the previous days water intake. Pt notices if she drinks 2 water bottle plus tea she will have diarrhea the next day. When pt cuts back on water, she doesn't have diarrhea. Amanda share BP during visit. Standing: 130/87 and Sitting: 121/70. Pt shows no signs of dehydration. Pt stated she does feel weak when standing. Pt has appt tomorrow at 1100. RN gave care advice and pt verbalized understanding.               Copied from CRM [id number]. Topic: Clinical - Medical Advice >> Mar 02, 2024 11:44 AM Everette C wrote: Reason for CRM: Marchelle Folks with [facility] has visited the patient today who expressed concerns related to diarrhea that has been occurring on alternating days for the past few months Reason for Disposition  [1] MODERATE diarrhea (e.g., 4-6 times / day more than normal) AND [2] present > 48 hours (2 days)  Answer Assessment - Initial Assessment Questions 1. DIARRHEA SEVERITY: "How bad is the diarrhea?" "How many more stools have you had in the past 24 hours than normal?"    - NO DIARRHEA (SCALE 0)   - MILD (SCALE 1-3): Few loose or mushy BMs; increase of 1-3 stools over normal daily number of stools; mild increase in ostomy output.   -  MODERATE (SCALE 4-7): Increase of 4-6 stools daily over normal; moderate increase in ostomy output.   -  SEVERE (SCALE 8-10; OR "WORST POSSIBLE"): Increase of 7 or more stools daily over normal; moderate increase in ostomy output; incontinence.     5-6 times but not sure   2. ONSET: "When did the diarrhea begin?"      Couple of months ago  3. BM CONSISTENCY: "How loose or watery is the diarrhea?"      Loose  4. VOMITING: "Are you also vomiting?" If Yes, ask: "How many times in the past 24 hours?"      Denies  5. ABDOMEN PAIN: "Are you having any abdomen pain?" If Yes, ask: "What does it feel like?" (e.g., crampy, dull, intermittent, constant)      Denies  6. ABDOMEN PAIN SEVERITY: If present, ask: "How bad is the pain?"  (e.g., Scale 1-10; mild, moderate, or severe)   - MILD (1-3): doesn't interfere with normal activities, abdomen soft and not tender to touch    - MODERATE (4-7): interferes with normal activities or awakens from sleep, abdomen tender to touch    - SEVERE (8-10): excruciating pain, doubled over, unable to do any normal activities       Denies  7. ORAL INTAKE: If vomiting, "Have you been able to drink liquids?" "How much liquids have you had in the past 24 hours?"     Yes, 2 bottles of water a day plus tea  8. HYDRATION: "Any signs of dehydration?" (e.g., dry mouth [not just dry lips], too weak to stand, dizziness, new weight loss) "When did you last urinate?"     This morning, does feel dizzy when standing up 9. EXPOSURE: "Have you traveled to a foreign country recently?" "Have you been exposed  to anyone with diarrhea?" "Could you have eaten any food that was spoiled?"     Denies  10. ANTIBIOTIC USE: "Are you taking antibiotics now or have you taken antibiotics in the past 2 months?"       Denies  11. OTHER SYMPTOMS: "Do you have any other symptoms?" (e.g., fever, blood in stool)       Hands shaking  Protocols used: Diarrhea-A-AH

## 2024-03-03 ENCOUNTER — Ambulatory Visit (INDEPENDENT_AMBULATORY_CARE_PROVIDER_SITE_OTHER): Admitting: Family Medicine

## 2024-03-03 ENCOUNTER — Encounter: Payer: Self-pay | Admitting: Family Medicine

## 2024-03-03 VITALS — BP 140/67 | HR 51 | Ht 63.0 in | Wt 144.0 lb

## 2024-03-03 DIAGNOSIS — K529 Noninfective gastroenteritis and colitis, unspecified: Secondary | ICD-10-CM

## 2024-03-03 MED ORDER — LINACLOTIDE 145 MCG PO CAPS: 145.0000 ug | ORAL_CAPSULE | Freq: Every day | ORAL | 0 refills | Status: DC

## 2024-03-03 NOTE — Progress Notes (Signed)
 Acute Office Visit  Introduced to nurse practitioner role and practice setting.  All questions answered.  Discussed provider/patient relationship and expectations.   Subjective:     Patient ID: Julie Acevedo, female    DOB: 12-Mar-1951, 73 y.o.   MRN: [id number]  Chief Complaint  Patient presents with   Diarrhea    States when drinking water or eating certain food she will have to keep going in and out of the bathroom   Julie Acevedo is a 73 year old female who presents with watery stools and urgency for about one year.  The stools are loose but not pure liquid, occurring every other day and multiple times a day. The urgency is significant, sometimes leading to near incontinence.  The symptoms are triggered by drinking liquids, particularly water and caffeinated tea, and certain foods like broccoli cheese soup, and ice cream. No abdominal pain, but she describes a 'bubbly' sensation in her stomach preceding the need to defecate. No fevers, chills, weight loss, nausea, or vomiting. Her stools are brown and do not contain blood or appear black. She has not noticed any floating stools.  She is currently taking Linzess at the highest dose for chronic constipation, which she has been using daily. Her daughter experiences similar symptoms, particularly after consuming foods with cream, but has not been diagnosed with any condition. She has not traveled recently except for a trip to the [place] about a year ago, which she does not associate with her symptoms.   Diarrhea  This is a chronic problem. The current episode started more than 1 month ago. The problem occurs 2 to 4 times per day. The problem has been waxing and waning. The stool consistency is described as Watery. The patient states that diarrhea does not awaken her from sleep. Associated symptoms include increased flatus. Pertinent negatives include no abdominal pain, arthralgias, bloating, chills, coughing, fever, headaches,  myalgias, sweats, URI, vomiting or weight loss. The symptoms are aggravated by dairy products. Risk factors include travel to endemic area (traveled to [place] one year ago). Her past medical history is significant for inflammatory bowel disease. There is no history of bowel resection, irritable bowel syndrome, malabsorption, a recent abdominal surgery or short gut syndrome.    Review of Systems  Constitutional:  Negative for chills, diaphoresis, fever, malaise/fatigue and weight loss.  Respiratory:  Negative for cough.   Cardiovascular:  Negative for palpitations.  Gastrointestinal:  Positive for diarrhea and flatus. Negative for abdominal pain, bloating, blood in stool, constipation, heartburn, melena, nausea and vomiting.  Musculoskeletal:  Negative for arthralgias and myalgias.  Skin:  Negative for rash.  Neurological:  Negative for headaches.  All other systems reviewed and are negative.       Objective:    BP (!) 140/67   Pulse (!) 51   Ht 5\' 3"  (1.6 m)   Wt 144 lb (65.3 kg)   SpO2 100%   BMI 25.51 kg/m    Physical Exam Vitals reviewed.  Constitutional:      General: She is not in acute distress.    Appearance: Normal appearance. She is normal weight. She is not ill-appearing, toxic-appearing or diaphoretic.  HENT:     Mouth/Throat:     Mouth: Mucous membranes are moist.  Abdominal:     General: Abdomen is flat. Bowel sounds are normal. There is no distension. There are no signs of injury.     Palpations: Abdomen is soft. There is no shifting dullness, fluid  wave, hepatomegaly, splenomegaly, mass or pulsatile mass.     Tenderness: There is abdominal tenderness in the left upper quadrant and left lower quadrant. There is no right CVA tenderness, left CVA tenderness, guarding or rebound.     Hernia: No hernia is present.     Comments: Mild tenderness to L abdomen   Musculoskeletal:     Right lower leg: No edema.     Left lower leg: No edema.  Skin:     General: Skin is warm and dry.     Capillary Refill: Capillary refill takes less than 2 seconds.  Neurological:     General: No focal deficit present.     Mental Status: She is alert and oriented to person, place, and time. Mental status is at baseline.  Psychiatric:        Mood and Affect: Mood normal.        Behavior: Behavior normal.        Thought Content: Thought content normal.        Judgment: Judgment normal.    No results found for any visits on 03/03/24.     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 - Order lab CMP, TSH, CBC, Celiac panel, ESR, CRP - Recommend daily fiber supplement such as Metamucil or psyllium husk. - Continue water intake to limit dehydration - Advise dietary modifications to avoid trigger foods. - Recommend decreasing daily linzess from to daily as this could be the cause of the diarrhea/loose stools. - Order home stool sample test to rule out bacterial infection. - Order abdominal x-ray.  Meds ordered this encounter  Medications   linaclotide (LINZESS) 145 MCG CAPS capsule    Sig: Take 1 capsule (145 mcg total) by mouth daily before breakfast.    Dispense:  90 capsule    Refill:  0    Return if symptoms worsen or fail to improve.  I, Sallee Provencal, FNP, have reviewed all documentation for this visit. The documentation on 03/03/24 for the exam, diagnosis, procedures, and orders are all accurate and complete.   Sallee Provencal, FNP

## 2024-03-04 DIAGNOSIS — E041 Nontoxic single thyroid nodule: Secondary | ICD-10-CM | POA: Diagnosis not present

## 2024-03-04 DIAGNOSIS — G20A1 Parkinson's disease without dyskinesia, without mention of fluctuations: Secondary | ICD-10-CM | POA: Diagnosis not present

## 2024-03-04 DIAGNOSIS — E119 Type 2 diabetes mellitus without complications: Secondary | ICD-10-CM | POA: Diagnosis not present

## 2024-03-04 DIAGNOSIS — E538 Deficiency of other specified B group vitamins: Secondary | ICD-10-CM | POA: Diagnosis not present

## 2024-03-04 DIAGNOSIS — I10 Essential (primary) hypertension: Secondary | ICD-10-CM | POA: Diagnosis not present

## 2024-03-04 DIAGNOSIS — I959 Hypotension, unspecified: Secondary | ICD-10-CM | POA: Diagnosis not present

## 2024-03-04 DIAGNOSIS — M1990 Unspecified osteoarthritis, unspecified site: Secondary | ICD-10-CM | POA: Diagnosis not present

## 2024-03-04 DIAGNOSIS — Z8673 Personal history of transient ischemic attack (TIA), and cerebral infarction without residual deficits: Secondary | ICD-10-CM | POA: Diagnosis not present

## 2024-03-04 DIAGNOSIS — G8929 Other chronic pain: Secondary | ICD-10-CM | POA: Diagnosis not present

## 2024-03-04 DIAGNOSIS — R634 Abnormal weight loss: Secondary | ICD-10-CM | POA: Diagnosis not present

## 2024-03-04 DIAGNOSIS — Z87891 Personal history of nicotine dependence: Secondary | ICD-10-CM | POA: Diagnosis not present

## 2024-03-04 DIAGNOSIS — M545 Low back pain, unspecified: Secondary | ICD-10-CM | POA: Diagnosis not present

## 2024-03-04 DIAGNOSIS — G4700 Insomnia, unspecified: Secondary | ICD-10-CM | POA: Diagnosis not present

## 2024-03-05 DIAGNOSIS — E119 Type 2 diabetes mellitus without complications: Secondary | ICD-10-CM | POA: Diagnosis not present

## 2024-03-05 DIAGNOSIS — M545 Low back pain, unspecified: Secondary | ICD-10-CM | POA: Diagnosis not present

## 2024-03-05 DIAGNOSIS — M1990 Unspecified osteoarthritis, unspecified site: Secondary | ICD-10-CM | POA: Diagnosis not present

## 2024-03-05 DIAGNOSIS — Z8673 Personal history of transient ischemic attack (TIA), and cerebral infarction without residual deficits: Secondary | ICD-10-CM | POA: Diagnosis not present

## 2024-03-05 DIAGNOSIS — I10 Essential (primary) hypertension: Secondary | ICD-10-CM | POA: Diagnosis not present

## 2024-03-05 DIAGNOSIS — G20A1 Parkinson's disease without dyskinesia, without mention of fluctuations: Secondary | ICD-10-CM | POA: Diagnosis not present

## 2024-03-05 DIAGNOSIS — G8929 Other chronic pain: Secondary | ICD-10-CM | POA: Diagnosis not present

## 2024-03-05 DIAGNOSIS — E041 Nontoxic single thyroid nodule: Secondary | ICD-10-CM | POA: Diagnosis not present

## 2024-03-05 DIAGNOSIS — I959 Hypotension, unspecified: Secondary | ICD-10-CM | POA: Diagnosis not present

## 2024-03-05 DIAGNOSIS — G4700 Insomnia, unspecified: Secondary | ICD-10-CM | POA: Diagnosis not present

## 2024-03-05 DIAGNOSIS — E538 Deficiency of other specified B group vitamins: Secondary | ICD-10-CM | POA: Diagnosis not present

## 2024-03-05 DIAGNOSIS — R634 Abnormal weight loss: Secondary | ICD-10-CM | POA: Diagnosis not present

## 2024-03-05 DIAGNOSIS — Z87891 Personal history of nicotine dependence: Secondary | ICD-10-CM | POA: Diagnosis not present

## 2024-03-10 DIAGNOSIS — M545 Low back pain, unspecified: Secondary | ICD-10-CM | POA: Diagnosis not present

## 2024-03-10 DIAGNOSIS — M1990 Unspecified osteoarthritis, unspecified site: Secondary | ICD-10-CM | POA: Diagnosis not present

## 2024-03-10 DIAGNOSIS — G8929 Other chronic pain: Secondary | ICD-10-CM | POA: Diagnosis not present

## 2024-03-10 DIAGNOSIS — I959 Hypotension, unspecified: Secondary | ICD-10-CM | POA: Diagnosis not present

## 2024-03-10 DIAGNOSIS — R634 Abnormal weight loss: Secondary | ICD-10-CM | POA: Diagnosis not present

## 2024-03-10 DIAGNOSIS — G4700 Insomnia, unspecified: Secondary | ICD-10-CM | POA: Diagnosis not present

## 2024-03-10 DIAGNOSIS — G20A1 Parkinson's disease without dyskinesia, without mention of fluctuations: Secondary | ICD-10-CM | POA: Diagnosis not present

## 2024-03-10 DIAGNOSIS — Z8673 Personal history of transient ischemic attack (TIA), and cerebral infarction without residual deficits: Secondary | ICD-10-CM | POA: Diagnosis not present

## 2024-03-10 DIAGNOSIS — E041 Nontoxic single thyroid nodule: Secondary | ICD-10-CM | POA: Diagnosis not present

## 2024-03-10 DIAGNOSIS — E538 Deficiency of other specified B group vitamins: Secondary | ICD-10-CM | POA: Diagnosis not present

## 2024-03-10 DIAGNOSIS — Z87891 Personal history of nicotine dependence: Secondary | ICD-10-CM | POA: Diagnosis not present

## 2024-03-10 DIAGNOSIS — I10 Essential (primary) hypertension: Secondary | ICD-10-CM | POA: Diagnosis not present

## 2024-03-10 DIAGNOSIS — E119 Type 2 diabetes mellitus without complications: Secondary | ICD-10-CM | POA: Diagnosis not present

## 2024-03-16 DIAGNOSIS — G20A1 Parkinson's disease without dyskinesia, without mention of fluctuations: Secondary | ICD-10-CM | POA: Diagnosis not present

## 2024-03-16 DIAGNOSIS — E119 Type 2 diabetes mellitus without complications: Secondary | ICD-10-CM | POA: Diagnosis not present

## 2024-03-16 DIAGNOSIS — I959 Hypotension, unspecified: Secondary | ICD-10-CM | POA: Diagnosis not present

## 2024-03-16 DIAGNOSIS — E041 Nontoxic single thyroid nodule: Secondary | ICD-10-CM | POA: Diagnosis not present

## 2024-03-16 DIAGNOSIS — R634 Abnormal weight loss: Secondary | ICD-10-CM | POA: Diagnosis not present

## 2024-03-16 DIAGNOSIS — G8929 Other chronic pain: Secondary | ICD-10-CM | POA: Diagnosis not present

## 2024-03-16 DIAGNOSIS — M1990 Unspecified osteoarthritis, unspecified site: Secondary | ICD-10-CM | POA: Diagnosis not present

## 2024-03-16 DIAGNOSIS — G4700 Insomnia, unspecified: Secondary | ICD-10-CM | POA: Diagnosis not present

## 2024-03-16 DIAGNOSIS — E538 Deficiency of other specified B group vitamins: Secondary | ICD-10-CM | POA: Diagnosis not present

## 2024-03-16 DIAGNOSIS — M545 Low back pain, unspecified: Secondary | ICD-10-CM | POA: Diagnosis not present

## 2024-03-16 DIAGNOSIS — I10 Essential (primary) hypertension: Secondary | ICD-10-CM | POA: Diagnosis not present

## 2024-03-16 DIAGNOSIS — Z87891 Personal history of nicotine dependence: Secondary | ICD-10-CM | POA: Diagnosis not present

## 2024-03-16 DIAGNOSIS — Z8673 Personal history of transient ischemic attack (TIA), and cerebral infarction without residual deficits: Secondary | ICD-10-CM | POA: Diagnosis not present

## 2024-03-18 ENCOUNTER — Telehealth: Payer: Self-pay

## 2024-03-18 ENCOUNTER — Ambulatory Visit: Payer: 59 | Admitting: Cardiovascular Disease

## 2024-03-18 DIAGNOSIS — G4700 Insomnia, unspecified: Secondary | ICD-10-CM | POA: Diagnosis not present

## 2024-03-18 DIAGNOSIS — I959 Hypotension, unspecified: Secondary | ICD-10-CM | POA: Diagnosis not present

## 2024-03-18 DIAGNOSIS — E041 Nontoxic single thyroid nodule: Secondary | ICD-10-CM | POA: Diagnosis not present

## 2024-03-18 DIAGNOSIS — M1990 Unspecified osteoarthritis, unspecified site: Secondary | ICD-10-CM | POA: Diagnosis not present

## 2024-03-18 DIAGNOSIS — G20A1 Parkinson's disease without dyskinesia, without mention of fluctuations: Secondary | ICD-10-CM | POA: Diagnosis not present

## 2024-03-18 DIAGNOSIS — M545 Low back pain, unspecified: Secondary | ICD-10-CM | POA: Diagnosis not present

## 2024-03-18 DIAGNOSIS — I10 Essential (primary) hypertension: Secondary | ICD-10-CM | POA: Diagnosis not present

## 2024-03-18 DIAGNOSIS — R634 Abnormal weight loss: Secondary | ICD-10-CM | POA: Diagnosis not present

## 2024-03-18 DIAGNOSIS — Z87891 Personal history of nicotine dependence: Secondary | ICD-10-CM | POA: Diagnosis not present

## 2024-03-18 DIAGNOSIS — E119 Type 2 diabetes mellitus without complications: Secondary | ICD-10-CM | POA: Diagnosis not present

## 2024-03-18 DIAGNOSIS — E538 Deficiency of other specified B group vitamins: Secondary | ICD-10-CM | POA: Diagnosis not present

## 2024-03-18 DIAGNOSIS — G8929 Other chronic pain: Secondary | ICD-10-CM | POA: Diagnosis not present

## 2024-03-18 DIAGNOSIS — Z8673 Personal history of transient ischemic attack (TIA), and cerebral infarction without residual deficits: Secondary | ICD-10-CM | POA: Diagnosis not present

## 2024-03-18 NOTE — Telephone Encounter (Signed)
 Copied from CRM [id number]. Topic: Clinical - Home Health Verbal Orders >> Mar 18, 2024 12:48 PM Julie Acevedo wrote: Caller/Agency: [facility] Callback Number: [phone] Service Requested: N/A Frequency: N/A Any new concerns about the patient? Yes, patient has a low heart rate of 54. Patient is not symptomatic. Nurse states that the patient has had this low of a heart rate many times before and has also not had symptoms so she does not consider it to be emergent.

## 2024-03-18 NOTE — Telephone Encounter (Signed)
 Reviewed note and patient's previous vital signs. Patient has intermittent bradycardia in 50s range, recommend office visit with either myself or her cardiologist if patient develops lightheadedness/dizziness or dyspnea.

## 2024-03-19 NOTE — Telephone Encounter (Signed)
 Copied from CRM [id number]. Topic: General - Other >> Mar 19, 2024  3:35 PM Shardie S wrote: Reason for CRM: Marchelle Folks with [facility] returning missed phone call to [facility] Callback# [phone]

## 2024-03-19 NOTE — Telephone Encounter (Signed)
 LM for [facility] with [facility] to call us back

## 2024-03-20 NOTE — Telephone Encounter (Signed)
 Spoke with Marchelle Folks and gave her provider's message

## 2024-03-30 ENCOUNTER — Encounter: Payer: Self-pay | Admitting: Oncology

## 2024-04-06 DIAGNOSIS — M545 Low back pain, unspecified: Secondary | ICD-10-CM | POA: Diagnosis not present

## 2024-04-06 DIAGNOSIS — M1990 Unspecified osteoarthritis, unspecified site: Secondary | ICD-10-CM | POA: Diagnosis not present

## 2024-04-06 DIAGNOSIS — Z8673 Personal history of transient ischemic attack (TIA), and cerebral infarction without residual deficits: Secondary | ICD-10-CM | POA: Diagnosis not present

## 2024-04-06 DIAGNOSIS — G8929 Other chronic pain: Secondary | ICD-10-CM | POA: Diagnosis not present

## 2024-04-06 DIAGNOSIS — E538 Deficiency of other specified B group vitamins: Secondary | ICD-10-CM | POA: Diagnosis not present

## 2024-04-06 DIAGNOSIS — E119 Type 2 diabetes mellitus without complications: Secondary | ICD-10-CM | POA: Diagnosis not present

## 2024-04-06 DIAGNOSIS — E041 Nontoxic single thyroid nodule: Secondary | ICD-10-CM | POA: Diagnosis not present

## 2024-04-06 DIAGNOSIS — G4700 Insomnia, unspecified: Secondary | ICD-10-CM | POA: Diagnosis not present

## 2024-04-06 DIAGNOSIS — Z87891 Personal history of nicotine dependence: Secondary | ICD-10-CM | POA: Diagnosis not present

## 2024-04-06 DIAGNOSIS — I959 Hypotension, unspecified: Secondary | ICD-10-CM | POA: Diagnosis not present

## 2024-04-06 DIAGNOSIS — I10 Essential (primary) hypertension: Secondary | ICD-10-CM | POA: Diagnosis not present

## 2024-04-06 DIAGNOSIS — G20A1 Parkinson's disease without dyskinesia, without mention of fluctuations: Secondary | ICD-10-CM | POA: Diagnosis not present

## 2024-04-06 DIAGNOSIS — R634 Abnormal weight loss: Secondary | ICD-10-CM | POA: Diagnosis not present

## 2024-04-26 IMAGING — CR DG CHEST 2V
2 series · 2 of 2 positions shown · non-contrast
Comparison: Chest radiograph dated 09/05/2021.

CLINICAL DATA: Chest pain.

EXAM:
CHEST - 2 VIEW

[chest pa]
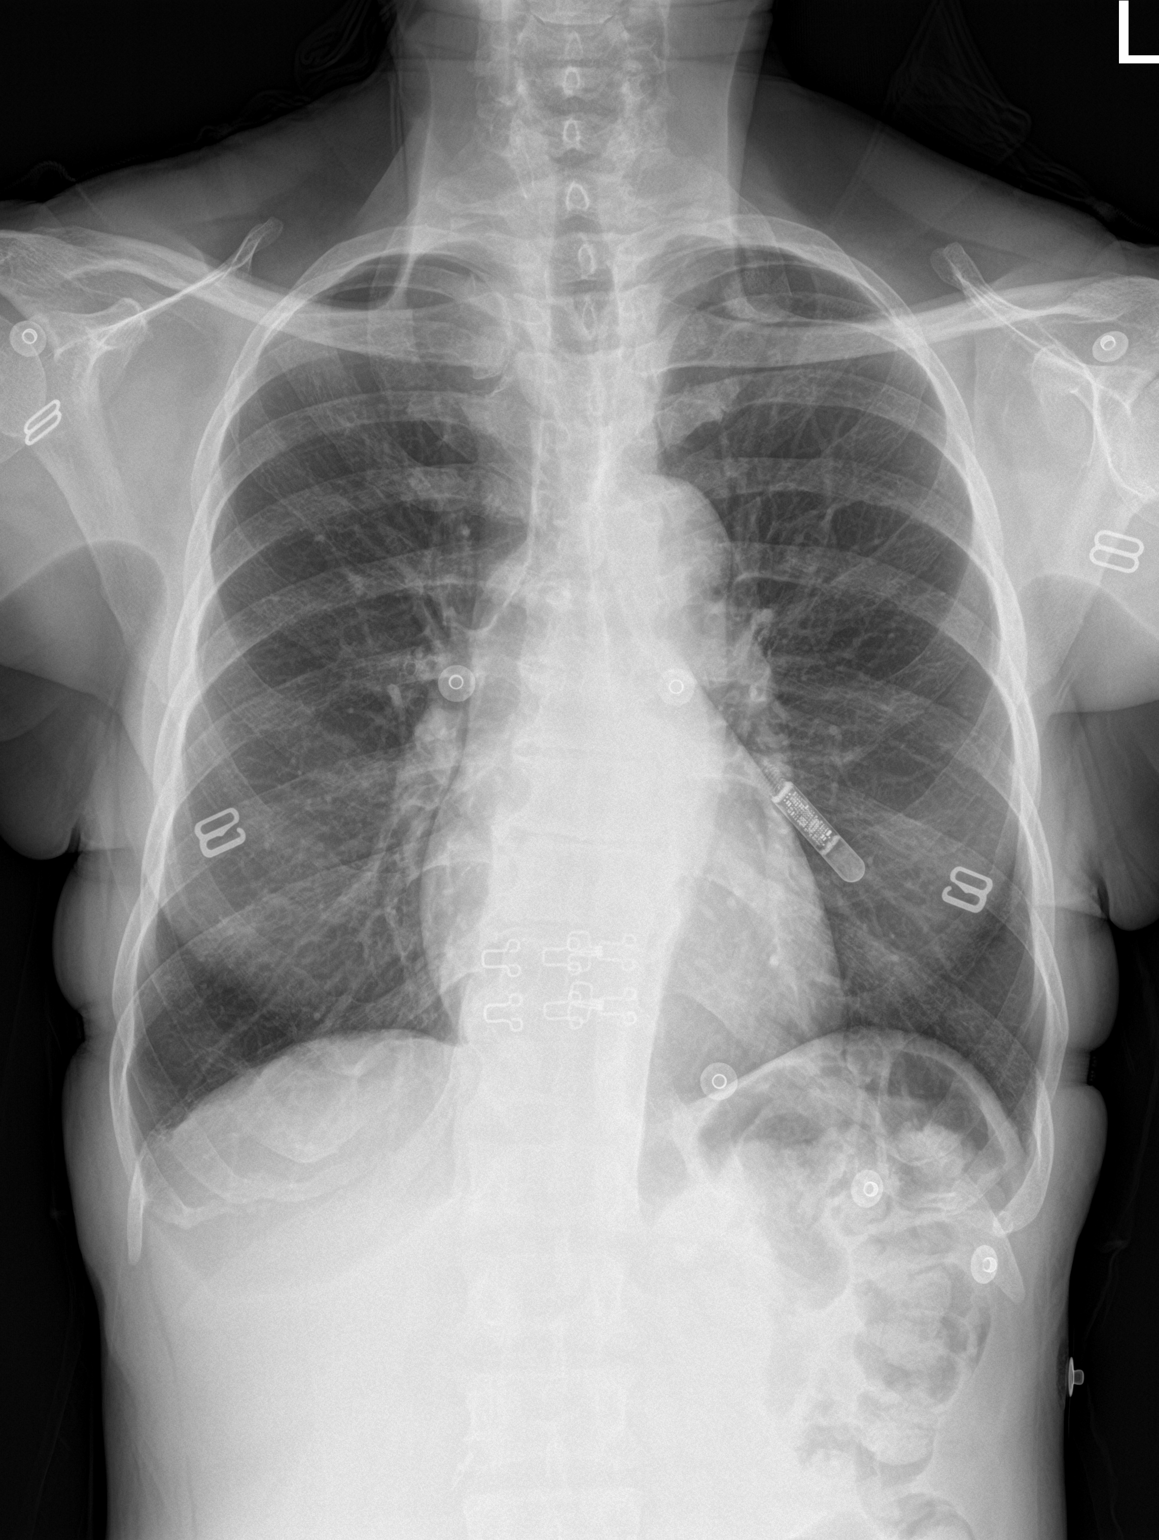

[chest lat]
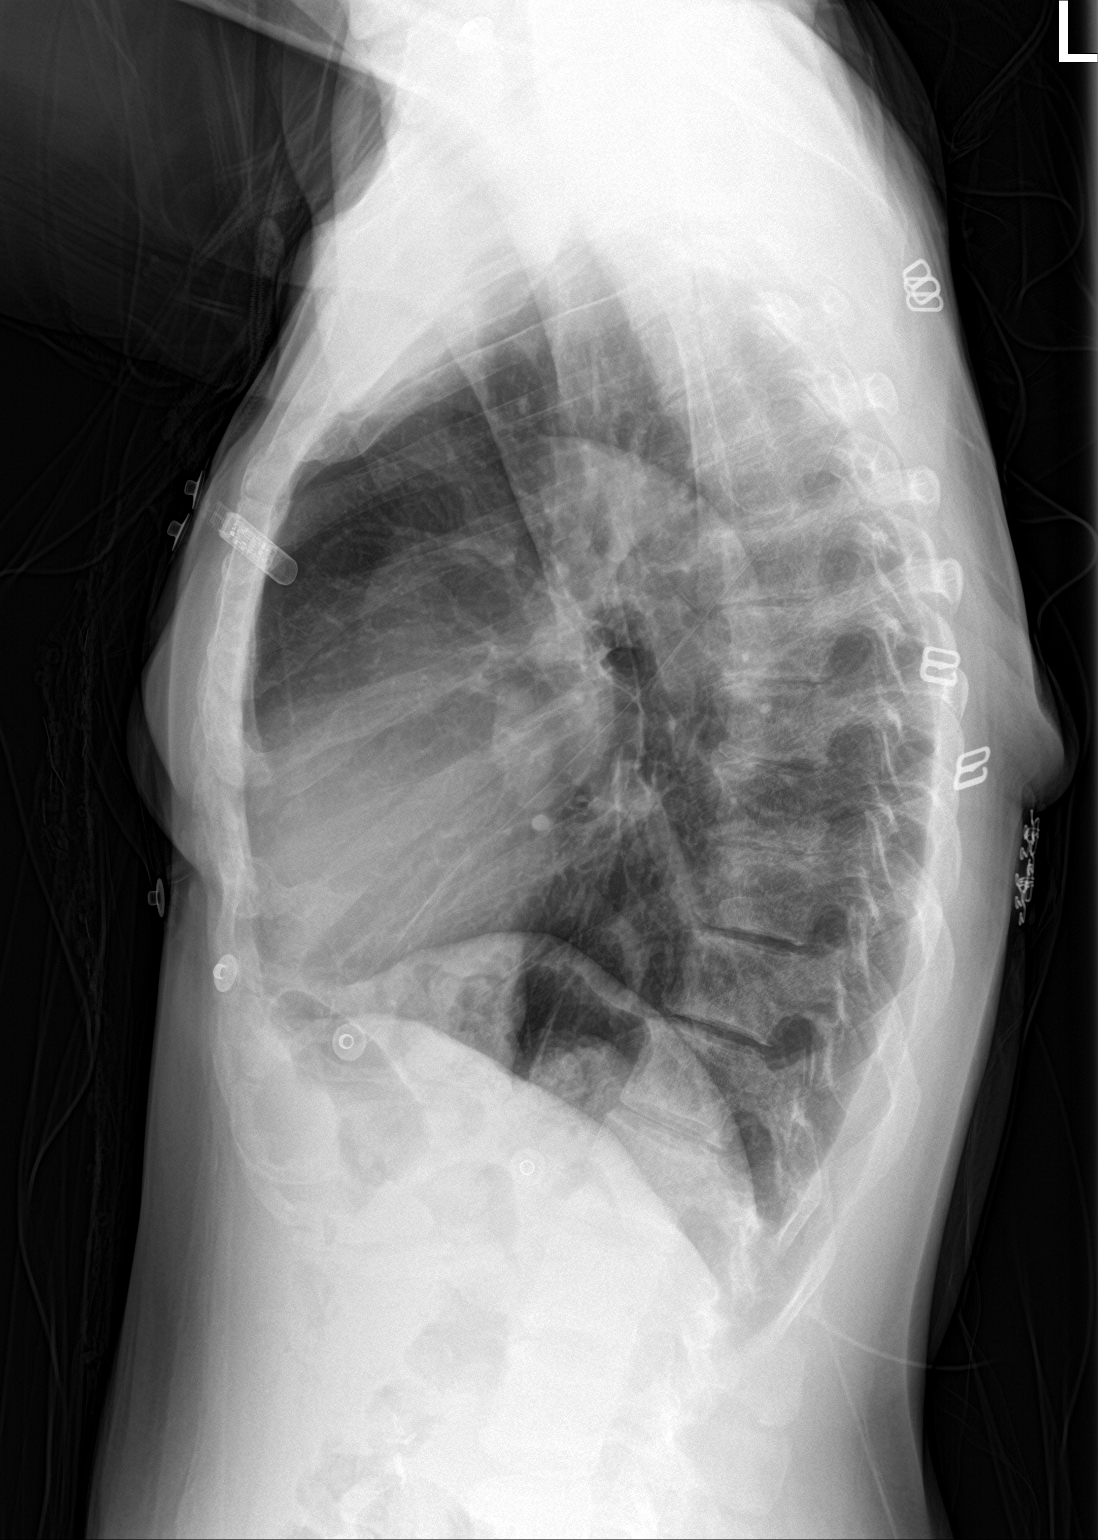

[2 of 2 positions shown; findings below may reference images not displayed]

FINDINGS: No focal consolidation, pleural effusion, pneumothorax. The cardiac
silhouette is within normal limits. Loop recorder device. No acute
osseous pathology.
IMPRESSION: No active cardiopulmonary disease.

## 2024-04-27 ENCOUNTER — Encounter: Payer: Self-pay | Admitting: Oncology

## 2024-04-30 NOTE — Progress Notes (Addendum)
 PROVIDER NOTE: Interpretation of information contained herein should be left to medically-trained personnel. Specific patient instructions are provided elsewhere under "Patient Instructions" section of medical record. This document was created in part using AI and STT-dictation technology, any transcriptional errors that may result from this process are unintentional.  Patient: Julie Acevedo  Service: E/M   PCP: Mimi Alt, MD  DOB: October 29, 1951  DOS: 05/04/2024  Provider: Candi Chafe, MD  MRN: [id number]  Delivery: Face-to-face  Specialty: Interventional Pain Management  Type: Established Patient  Setting: Ambulatory outpatient facility  Specialty designation: 09  Referring Prov.: Simmons-Robinson, Makie*  Location: Outpatient office facility       HPI  [name], a 73 y.o. year old female, is here today because of her Low back pain radiating to right lower extremity [M54.50, M79.604]. Ms. Sisler primary complain today is Back Pain (Here to eval mid to lower back pain)  Pertinent problems: Ms. Murfin has Arthritis; Narrowing of intervertebral disc space; Chronic low back pain of over 3 months duration; Lumbosacral radiculopathy at S1 (Left); Arthritis of knee, degenerative; Failed back surgical syndrome; Acquired trigger finger; Low back pain radiating to lower extremity (Right); PTTD (posterior tibial tendon dysfunction); Chronic pain syndrome; Abnormal CT scan, cervical spine (11/19/2022); Abnormal MRI, lumbar spine (09/25/2023); DDD (degenerative disc disease), cervical; DDD (degenerative disc disease), lumbar; Cervical foraminal stenosis (C3-C7); Chronic low back pain (1ry area of Pain) (Bilateral) (R>L) w/o sciatica; Chronic lower extremity pain (2ry area of Pain) (Right); Osteoarthritis of shoulder (Right); Osteoarthritis of knees (Bilateral); Chronic hip pain (3ry area of Pain) (Right); Lumbar facet joint pain; Lumbar facet joint syndrome; Arthropathy of hip (Right);  Osteoarthritis of hip (Right); 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 Pain Assessment: Severity of Chronic pain is reported as a 8 /10. Location: Back Mid, Lower/radiates down right hip. Onset: More than a month ago. Quality: Aching, Constant. Timing: Constant. Modifying factor(s): denies. Vitals:  height is 5\' 3"  (1.6 m) and weight is 137 lb (62.1 kg). Her temperature is 97.1 F (36.2 C) (abnormal). Her blood pressure is 143/77 (abnormal) and her pulse is 55 (abnormal). Her oxygen saturation is 99%.  BMI: Estimated body mass index is 24.27 kg/m as calculated from the following:   Height as of this encounter: 5\' 3"  (1.6 m).   Weight as of this encounter: 137 lb (62.1 kg). Last encounter: 02/13/2024. Last procedure: 01/30/2024.  Reason for encounter: evaluation of worsening, or previously known (established) problem.   Discussed the use of AI scribe software for clinical note transcription with the patient, who gave verbal consent to proceed.  History of Present Illness   Julie Acevedo is a 73 year old female with a history of back surgery and hardware placement who presents with back pain radiating to the right leg.  She experiences back pain radiating down her right leg to about mid-thigh. Previously, the pain extended to her knee before receiving a caudal epidural steroid injection, which provided significant relief. Her current back pain is described as 'out of control.'  She reports swelling in her legs and hands, which she attributes to kidney  problems. She is on medication  for her kidney condition and avoids over-the-counter medications that could exacerbate her kidney issues.       Pharmacotherapy Assessment  Analgesic: No chronic opioid analgesics therapy prescribed by our practice. None MME/day: 0 mg/day   Monitoring: [place] PMP: PDMP reviewed during this encounter.       Pharmacotherapy: No side-effects or adverse reactions reported. Compliance: No problems identified. Effectiveness: Clinically acceptable.  Merilyn Staple, RN  05/04/2024 10:31 AM  Signed Safety precautions to be maintained throughout the outpatient stay will include: orient to surroundings, keep bed in low position, maintain call bell within reach at all times, provide assistance with transfer out of bed and ambulation.     No results found for: "CBDTHCR" No results found for: "D8THCCBX" No results found for: "D9THCCBX"  UDS:  Summary  Date Value Ref Range Status  06/19/2023 Note  Final    Comment:    ==================================================================== Compliance Drug Analysis, Ur ==================================================================== Test                             Result       Flag       Units  Drug Present and Declared for Prescription Verification   Gabapentin                      PRESENT      EXPECTED   Mirtazapine                     PRESENT      EXPECTED   Hydroxyzine                     PRESENT      EXPECTED   Metoprolol                      PRESENT      EXPECTED  Drug Absent but Declared for Prescription Verification   Levetiracetam                   Not Detected UNEXPECTED ==================================================================== Test                      Result    Flag   Units      Ref Range   Creatinine              60               mg/dL      >=16 ==================================================================== Declared Medications:  The flagging and interpretation on this report are based  on the  following declared medications.  Unexpected results may arise from  inaccuracies in the declared medications.   **Note: The testing scope of this panel includes these medications:   Gabapentin  (Neurontin )  Hydroxyzine   Levetiracetam  (Keppra )  Metoprolol   Mirtazapine  (Remeron )   **Note: The testing scope of this panel does not include the  following reported medications:   Atorvastatin   Calcitriol   Diazoxide  (Proglycem )  Linaclotide  (Linzess )  Rosuvastatin  (Crestor )  Triamcinolone  (Kenalog ) ==================================================================== For clinical consultation, please call [phone]. ====================================================================       ROS  Constitutional: Denies any fever or chills Gastrointestinal: No reported hemesis, hematochezia, vomiting, or acute GI distress Musculoskeletal: Denies any acute onset joint swelling, redness, loss of ROM, or weakness Neurological: No reported episodes of acute onset apraxia, aphasia, dysarthria, agnosia, amnesia, paralysis, loss of coordination, or loss of consciousness  Medication Review  Accu-Chek Softclix Lancets, atorvastatin , blood glucose meter kit and supplies, calcitRIOL , diazoxide , diphenhydrAMINE , gabapentin , glucose blood, hydrOXYzine , ibuprofen, levETIRAcetam , linaclotide , metoprolol  succinate, mirtazapine , omeprazole , ondansetron , predniSONE , rivastigmine , rosuvastatin , traZODone , and triamcinolone  cream  History Review  Allergy: Ms. Uriostegui is allergic to lotrel [amlodipine  besy-benazepril hcl], contrast media [iodinated contrast media], niacin, and sulfa antibiotics. Drug: Ms. Ruzich  reports no history of drug use. Alcohol:  reports no history of alcohol use. Tobacco:  reports that she has never smoked. She has never used smokeless tobacco. Social: Ms. Salsberry  reports that she has never smoked. She has never used smokeless tobacco. She reports that she does not drink  alcohol and does not use drugs. Medical:  has a past medical history of Anemia, Arthritis, COVID-19 (11/19/2019), Diverticulitis, DM (diabetes mellitus) (HCC), History of methicillin resistant staphylococcus aureus (MRSA) (2017), HTN (hypertension), Hyperlipidemia, L-S radiculopathy (06/11/2015), Left leg swelling (01/22/2023), Lower abdominal pain (08/08/2022), Memory difficulties (09/01/2015), Multiple thyroid  nodules, Myocardial infarction (HCC) (1995), Partial small bowel obstruction (HCC) (04/23/2016), SBO (small bowel obstruction) (HCC), Short-term memory loss, and Thyroid  nodule. Surgical: Ms. Disanti  has a past surgical history that includes Hemicolectomy; Lumbar disc surgery; Roux-en-Y Gastric Bypass (2010); Cholecystectomy; Eye surgery; Hand surgery; Foot surgery; Abdominal hysterectomy; Tonsillectomy; Upper gi endoscopy (01/12/2016); Cardiac catheterization (2000); Laparoscopic lysis of adhesions (N/A, 08/21/2016); Ventral hernia repair (N/A, 08/21/2016); Insertion of mesh (N/A, 08/21/2016); Cystocele repair (N/A, 03/19/2017); Cystoscopy (N/A, 03/19/2017); Laparoscopic removal abdominal mass; LOOP RECORDER INSERTION (N/A, 03/21/2018); laparotomy (N/A, 04/16/2019); XI robotic assisted ventral hernia (N/A, 12/08/2019); Insertion of mesh (N/A, 12/08/2019); Breast excisional biopsy (Right, 1971); Small intestine surgery; and Esophagogastroduodenoscopy (egd) with propofol  (N/A, 12/15/2021). Family: family history includes Breast cancer in her paternal grandmother; Breast cancer (age of onset: 30) in her paternal aunt; Breast cancer (age of onset: 6) in her paternal aunt; COPD in her mother; Cancer in her paternal aunt and paternal grandmother; Cancer (age of onset: 18) in her father; Congestive Heart Failure in her maternal grandmother; Coronary artery disease (age of onset: 45) in her father; Diabetes in her mother; Emphysema in her maternal grandfather; Heart attack in her paternal grandfather;  Hypertension in her mother.  Laboratory Chemistry Profile   Renal Lab Results  Component Value Date   BUN 10 01/28/2024   CREATININE 1.32 (H) 01/28/2024   BCR 8 (L) 01/28/2024   GFRAA 42 (L) 12/05/2020   GFRNONAA 42 (L) 06/19/2023    Hepatic Lab Results  Component Value Date   AST 61 (H) 06/19/2023   ALT 53 (H) 06/19/2023   ALBUMIN 4.0 06/19/2023   ALKPHOS 209 (H) 06/19/2023   LIPASE 27 08/31/2022   AMMONIA 68 (H) 11/15/2012    Electrolytes Lab Results  Component Value Date   NA 144 01/28/2024   K 4.2 01/28/2024   CL 107 (H) 01/28/2024   CALCIUM  9.6 01/28/2024   MG 2.2 06/19/2023   PHOS 3.4 11/20/2022    Bone Lab Results  Component Value Date   VD25OH 51.23 08/09/2022   25OHVITD1 39 06/19/2023   25OHVITD2 1.0 06/19/2023   25OHVITD3 38 06/19/2023    Inflammation (CRP: Acute Phase) (ESR: Chronic Phase) Lab Results  Component Value Date   CRP <0.5 06/19/2023   ESRSEDRATE 40 (H) 06/19/2023   LATICACIDVEN 1.1 08/21/2022         Note: Above Lab results reviewed.  Recent Imaging Review  CT CORONARY MORPH W/CTA COR W/SCORE W/CA W/CM &/OR WO/CM Addendum: ADDENDUM REPORT: 02/29/2024 19:48   EXAM:  OVER-READ INTERPRETATION  CT CHEST  The following report is an over-read performed by radiologist Dr.  Cyndia Drape [facility] on 02/29/2024. This  over-read does not include interpretation of cardiac or coronary  anatomy or pathology. The coronary CTA interpretation by the  cardiologist is attached.   COMPARISON:  None.   FINDINGS:  Cardiovascular:  See findings discussed in the body of the report.   Mediastinum/Nodes: No suspicious adenopathy identified. Imaged  mediastinal structures are unremarkable.   Lungs/Pleura: There is dependent basilar subsegmental atelectasis.  No pneumonia or pulmonary edema. No pleural effusion or  pneumothorax.   Upper Abdomen: No acute abnormality.   Musculoskeletal: No chest wall abnormality. No acute  osseous  findings.   IMPRESSION:  No acute extracardiac incidental findings.   Electronically Signed    By: Sydell Eva M.D.    On: 02/29/2024 19:48 Narrative: CLINICAL DATA:  Chest pain  EXAM: Cardiac/Coronary  CTA  TECHNIQUE: The patient was scanned on a Siemens Somatom scanner.  : A retrospective scan was triggered in the ascending thoracic aorta. Axial non-contrast 3 mm slices were carried out through the heart. The data set was analyzed on a dedicated work station and scored using the Agatson method. Gantry rotation speed was 66 msecs and collimation was .6 mm. 25 mg of metoprolol  and 0.8 mg of sl NTG was given. The 3D data set was reconstructed in 5% intervals of the 60-95 % of the R-R cycle. Diastolic phases were analyzed on a dedicated work station using MPR, MIP and VRT modes. The patient received 80 cc of contrast.  FINDINGS: Aorta:  Normal size.  No calcifications.  No dissection.  Aortic Valve:  Trileaflet.  No calcifications.  Coronary Arteries:  Normal coronary origin.  Right dominance.  RCA is a dominant artery. There is no plaque.  Left main gives rise to LAD and LCX arteries. LM has no disease.  LAD has no plaque.  LCX is a non-dominant artery.  There is no plaque.  Other findings:  Normal pulmonary vein drainage into the left atrium.  Normal left atrial appendage without a thrombus.  Normal size of the pulmonary artery.  IMPRESSION: 1. Coronary calcium  score of 0.  2. Normal coronary origin with right dominance.  3. No evidence of CAD.  4. CAD-RADS 0. Consider non-atherosclerotic causes of chest pain.  Electronically Signed: By: Constancia Delton M.D. On: 02/10/2024 13:31 Note: Reviewed         Physical Exam  General appearance: Well nourished, well developed, and well hydrated. In no apparent acute distress Mental status: Alert, oriented x 3 (person, place, & time)       Respiratory: No evidence of acute respiratory  distress Eyes: PERLA Vitals: BP (!) 143/77   Pulse (!) 55   Temp (!) 97.1 F (36.2 C)   Ht 5\' 3"  (1.6 m)   Wt 137 lb (62.1 kg)   SpO2 99%   BMI 24.27 kg/m  BMI: Estimated body mass index is 24.27 kg/m as calculated from the following:   Height as of this encounter: 5\' 3"  (1.6 m).   Weight as of this encounter: 137 lb (62.1 kg). Ideal: Ideal body weight: 52.4 kg (115 lb 8.3 oz) Adjusted ideal body weight: 56.3 kg (124 lb 1.8 oz)  Assessment   Diagnosis Status  1. Low back pain radiating to lower extremity (Right)   2. Chronic low back pain of over 3 months duration   3. Chronic low back pain (1ry area of Pain) (Bilateral) (R>L) w/o sciatica  4. Chronic lower extremity pain (2ry area of Pain) (Right)   5. Degeneration of intervertebral disc of lumbar region with discogenic back pain and lower extremity pain   6. Failed back surgical syndrome   7. Lumbar radiculopathy (S1) (Left)   8. Lumbosacral radiculopathy at S1 (Left)   9. History of allergy to radiographic contrast media    Controlled Controlled Controlled   Updated Problems: No problems updated.  Plan of Care  Problem-specific:  Assessment and Plan    Back pain with radiculopathy   Chronic back pain with radiculopathy extends down the right leg to mid-thigh. Previous caudal epidural steroid injection provided relief. Current pain is localized to the back, expected due to previous back surgery and hardware. Schedule a caudal epidural steroid injection with sedation. Ensure she has a driver for the procedure. Send pre-medication prescriptions to the pharmacy for contrast allergy.  Back surgery with hardware   Back surgery with hardware contributes to chronic back pain. Pain management includes caudal epidural steroid injections.  Chronic kidney disease   Chronic kidney disease is managed with medication. Advise against over-the-counter medications that may affect kidney function.  Allergy to contrast media    Allergy to contrast media requires pre-medication before procedures involving contrast. Send pre-medication prescriptions to the pharmacy for contrast allergy.       Ms. MANASVINI WHATLEY has a current medication list which includes the following long-term medication(s): accu-chek guide, diphenhydramine , diphenhydramine , gabapentin , levetiracetam , linaclotide , metoprolol  succinate, mirtazapine , omeprazole , and rosuvastatin .  Pharmacotherapy (Medications Ordered): Meds ordered this encounter  Medications   DISCONTD: predniSONE  & diphenhydrAMINE  3 x 50 MG & 1 x 50 MG KIT    Sig: Take 50 mg by mouth PRO for 1 day. Take Prednisone  1 tab (50 mg) by mouth at 13 hours, 7 hours, and 1 hour before contrast media injection. In addition take Diphenhydramine  (Benadryl ) 1 tab (50 mg) by mouth 1 hour before contrast injection.    Dispense:  1 kit    Refill:  1    Must have a driver.   predniSONE  (DELTASONE ) 50 MG tablet    Sig: Take 1 tab PO 13 hrs, 7 hrs, and 1 hr before contrast media injection (procedure).    Dispense:  10 tablet    Refill:  0    To be started the day before procedure (13 hours before).   diphenhydrAMINE  (BENADRYL ) 50 MG capsule    Sig: Take 50 mg by mouth 1 hour before procedure. Must have a driver.    Dispense:  10 capsule    Refill:  0    To be taken with last dose of steroid, 1 hour before procedure.   Orders:  Orders Placed This Encounter  Procedures   Caudal Epidural Injection    Standing Status:   Future    Expiration Date:   08/04/2024    Scheduling Instructions:     Laterality: Left-sided     Level(s): Sacrococcygeal canal (Tailbone area)     Sedation: Patient's choice     Scheduling Timeframe: As soon as pre-approved    Where will this procedure be performed?:   [facility]   Follow-up plan:   Return for (ECT): (R) Caudal ESI #2.     Interventional Therapies  Risk Factors  Considerations:  Cardiologist: Burney Carter, MD ([facility])   Neurologist: Devora Folks, MD [facility] [facility]) Hx of MI (1995)  Hx of TIAs  T2DM  Stage 3b CKD  [name]  Memory impairment  Hx of Bowel Obstruction (04/2019)  GERD  s/p Gastric Bypass for obesity  Malnutrition-malabsorption  Hx. Seizures      Planned  Pending:   Therapeutic left caudal ESI #2 (contrast allergy protocol ordered)    Under consideration:   Diagnostic left caudal ESI #2  Diagnostic bilateral lumbar facet MBB #2  Diagnostic/therapeutic right IA hip joint injection #1    Completed:   Diagnostic left caudal ESI x1 (01/30/2024) (100/100/75/80-95 x 3 mo)  Diagnostic bilateral lumbar facet MBB x1 (09/10/2023) (100/100/80 x 1 month)    Therapeutic  Palliative (PRN) options:   None established   Completed by other providers:   Therapeutic/diagnostic right L5 TFESI x2 (04/11/2016, 05/02/2016) by Earvin Goldberg, DO [facility])  Therapeutic/diagnostic right S1 TFESI x2 (04/11/2016, 05/02/2016) by Earvin Goldberg, DO [facility])  Therapeutic/diagnostic right L3 TFESI x1 (07/19/2016) by Earvin Goldberg, DO [facility])      Recent Visits Date Type Provider Dept  02/13/24 Office Visit Renaldo Caroli, MD [facility]  Showing recent visits within past 90 days and meeting all other requirements Today's Visits Date Type Provider Dept  05/04/24 Office Visit Renaldo Caroli, MD [facility]  Showing today's visits and meeting all other requirements Future Appointments Date Type Provider Dept  05/05/24 Appointment Renaldo Caroli, MD [facility]  Showing future appointments within next 90 days and meeting all other requirements   I discussed the assessment and treatment plan with the patient. The patient was provided an opportunity to ask questions and all were answered. The patient agreed with the plan and demonstrated an understanding of the instructions.  Patient advised to call back or seek an in-person evaluation if the symptoms or  condition worsens.  Duration of encounter: 30 minutes.  Total time on encounter, as per AMA guidelines included both the face-to-face and non-face-to-face time personally spent by the physician and/or other qualified health care professional(s) on the day of the encounter (includes time in activities that require the physician or other qualified health care professional and does not include time in activities normally performed by clinical staff). Physician's time may include the following activities when performed: Preparing to see the patient (e.g., pre-charting review of records, searching for previously ordered imaging, lab work, and nerve conduction tests) Review of prior analgesic pharmacotherapies. Reviewing PMP Interpreting ordered tests (e.g., lab work, imaging, nerve conduction tests) Performing post-procedure evaluations, including interpretation of diagnostic procedures Obtaining and/or reviewing separately obtained history Performing a medically appropriate examination and/or evaluation Counseling and educating the patient/family/caregiver Ordering medications, tests, or procedures Referring and communicating with other health care professionals (when not separately reported) Documenting clinical information in the electronic or other health record Independently interpreting results (not separately reported) and communicating results to the patient/ family/caregiver Care coordination (not separately reported)  Note by: Candi Chafe, MD (TTS and AI technology used. I apologize for any typographical errors that were not detected and corrected.) Date: 05/04/2024; Time: 3:46 PM

## 2024-05-04 ENCOUNTER — Encounter: Payer: Self-pay | Admitting: Pain Medicine

## 2024-05-04 ENCOUNTER — Ambulatory Visit: Attending: Pain Medicine | Admitting: Pain Medicine

## 2024-05-04 ENCOUNTER — Other Ambulatory Visit: Payer: Self-pay | Admitting: Pain Medicine

## 2024-05-04 ENCOUNTER — Telehealth: Payer: Self-pay | Admitting: Pain Medicine

## 2024-05-04 VITALS — BP 143/77 | HR 55 | Temp 97.1°F | Ht 63.0 in | Wt 137.0 lb

## 2024-05-04 DIAGNOSIS — M5416 Radiculopathy, lumbar region: Secondary | ICD-10-CM | POA: Insufficient documentation

## 2024-05-04 DIAGNOSIS — M5417 Radiculopathy, lumbosacral region: Secondary | ICD-10-CM | POA: Insufficient documentation

## 2024-05-04 DIAGNOSIS — M79604 Pain in right leg: Secondary | ICD-10-CM | POA: Diagnosis not present

## 2024-05-04 DIAGNOSIS — Z91041 Radiographic dye allergy status: Secondary | ICD-10-CM

## 2024-05-04 DIAGNOSIS — G8929 Other chronic pain: Secondary | ICD-10-CM | POA: Insufficient documentation

## 2024-05-04 DIAGNOSIS — M51362 Other intervertebral disc degeneration, lumbar region with discogenic back pain and lower extremity pain: Secondary | ICD-10-CM | POA: Insufficient documentation

## 2024-05-04 DIAGNOSIS — M545 Low back pain, unspecified: Secondary | ICD-10-CM | POA: Insufficient documentation

## 2024-05-04 DIAGNOSIS — M961 Postlaminectomy syndrome, not elsewhere classified: Secondary | ICD-10-CM | POA: Insufficient documentation

## 2024-05-04 MED ORDER — PREDNISONE & DIPHENHYDRAMINE 3 X 50 MG & 1 X 50 MG PO KIT: 50.0000 mg | PACK | ORAL | 1 refills | Status: DC

## 2024-05-04 MED ORDER — PREDNISONE 50 MG PO TABS: ORAL_TABLET | ORAL | 0 refills | Status: AC

## 2024-05-04 MED ORDER — DIPHENHYDRAMINE HCL 50 MG PO CAPS: ORAL_CAPSULE | ORAL | 0 refills | Status: AC

## 2024-05-04 NOTE — Telephone Encounter (Signed)
 States the pharmacy is telling her they do not have a script for her benedryl for tomorrows appt. Please call the pharmacy and then call the patient

## 2024-05-04 NOTE — Patient Instructions (Signed)

## 2024-05-04 NOTE — Addendum Note (Signed)
 Addended by: Renaldo Caroli A on: 05/04/2024 03:46 PM   Modules accepted: Orders

## 2024-05-04 NOTE — Progress Notes (Signed)
 Safety precautions to be maintained throughout the outpatient stay will include: orient to surroundings, keep bed in low position, maintain call bell within reach at all times, provide assistance with transfer out of bed and ambulation.

## 2024-05-04 NOTE — Telephone Encounter (Signed)
 Prescriptions resent by Dr. Barth Borne.

## 2024-05-05 ENCOUNTER — Encounter: Payer: Self-pay | Admitting: Pain Medicine

## 2024-05-05 ENCOUNTER — Ambulatory Visit
Admission: RE | Admit: 2024-05-05 | Discharge: 2024-05-05 | Disposition: A | Discharge status: Home / Self Care | Source: Ambulatory Visit | Attending: Pain Medicine | Admitting: Pain Medicine

## 2024-05-05 ENCOUNTER — Ambulatory Visit: Attending: Pain Medicine | Admitting: Pain Medicine

## 2024-05-05 VITALS — BP 121/87 | HR 59 | Temp 97.4°F | Resp 16 | Ht 63.0 in | Wt 137.0 lb

## 2024-05-05 DIAGNOSIS — M79604 Pain in right leg: Secondary | ICD-10-CM | POA: Diagnosis not present

## 2024-05-05 DIAGNOSIS — G8929 Other chronic pain: Secondary | ICD-10-CM

## 2024-05-05 DIAGNOSIS — M5416 Radiculopathy, lumbar region: Secondary | ICD-10-CM

## 2024-05-05 DIAGNOSIS — M5410 Radiculopathy, site unspecified: Secondary | ICD-10-CM

## 2024-05-05 DIAGNOSIS — M545 Low back pain, unspecified: Secondary | ICD-10-CM | POA: Insufficient documentation

## 2024-05-05 DIAGNOSIS — Z91041 Radiographic dye allergy status: Secondary | ICD-10-CM

## 2024-05-05 DIAGNOSIS — M51361 Other intervertebral disc degeneration, lumbar region with lower extremity pain only: Secondary | ICD-10-CM | POA: Diagnosis not present

## 2024-05-05 DIAGNOSIS — M5417 Radiculopathy, lumbosacral region: Secondary | ICD-10-CM

## 2024-05-05 DIAGNOSIS — M961 Postlaminectomy syndrome, not elsewhere classified: Secondary | ICD-10-CM

## 2024-05-05 MED ORDER — MIDAZOLAM HCL 2 MG/2ML IJ SOLN
0.5000 mg | Freq: Once | INTRAMUSCULAR | Status: AC
  Administered 2024-05-05: 2 mg via INTRAVENOUS
  Filled 2024-05-05: qty 2

## 2024-05-05 MED ORDER — SODIUM CHLORIDE 0.9% FLUSH
2.0000 mL | Freq: Once | INTRAVENOUS | Status: AC
Start: 2024-05-05 — End: 2024-05-05
  Administered 2024-05-05: 2 mL

## 2024-05-05 MED ORDER — IOHEXOL 180 MG/ML  SOLN
10.0000 mL | Freq: Once | INTRAMUSCULAR | Status: AC
  Administered 2024-05-05: 10 mL via EPIDURAL
  Filled 2024-05-05: qty 20

## 2024-05-05 MED ORDER — ROPIVACAINE HCL 2 MG/ML IJ SOLN
2.0000 mL | Freq: Once | INTRAMUSCULAR | Status: AC
Start: 2024-05-05 — End: 2024-05-05
  Administered 2024-05-05: 2 mL via EPIDURAL
  Filled 2024-05-05: qty 20

## 2024-05-05 MED ORDER — TRIAMCINOLONE ACETONIDE 40 MG/ML IJ SUSP
40.0000 mg | Freq: Once | INTRAMUSCULAR | Status: AC
  Administered 2024-05-05: 40 mg
  Filled 2024-05-05: qty 1

## 2024-05-05 MED ORDER — SODIUM CHLORIDE (PF) 0.9 % IJ SOLN
INTRAMUSCULAR | Status: AC
  Filled 2024-05-05: qty 10

## 2024-05-05 MED ORDER — PENTAFLUOROPROP-TETRAFLUOROETH EX AERO
INHALATION_SPRAY | Freq: Once | CUTANEOUS | Status: AC
Start: 2024-05-05 — End: 2024-05-05
  Administered 2024-05-05: 30 via TOPICAL

## 2024-05-05 MED ORDER — LIDOCAINE HCL 2 % IJ SOLN
20.0000 mL | Freq: Once | INTRAMUSCULAR | Status: AC
Start: 2024-05-05 — End: 2024-05-05
  Administered 2024-05-05: 400 mg
  Filled 2024-05-05: qty 20

## 2024-05-05 NOTE — Patient Instructions (Signed)

## 2024-05-05 NOTE — Progress Notes (Signed)
 PROVIDER NOTE: Interpretation of information contained herein should be left to medically-trained personnel. Specific patient instructions are provided elsewhere under "Patient Instructions" section of medical record. This document was created in part using STT-dictation technology, any transcriptional errors that may result from this process are unintentional.  Patient: Julie Acevedo Type: Established DOB: 1951-02-12 MRN: [id number] PCP: Mimi Alt, MD  Service: Procedure DOS: 05/05/2024 Setting: Ambulatory Location: Ambulatory outpatient facility Delivery: Face-to-face Provider: Candi Chafe, MD Specialty: Interventional Pain Management Specialty designation: 09 Location: Outpatient facility Ref. Prov.: Simmons-Robinson, Makie*       Interventional Therapy   Type: Caudal Epidural Steroid Injection   #2  Laterality: Midline aiming left Level: Sacrococcygeal ligament  Imaging: Fluoroscopy-guided Spinal ([id number]) Anesthesia: Local anesthesia (1-2% Lidocaine ) Anxiolysis: IV Versed  2.0 mg Sedation: No Sedation None required. No Fentanyl  administered.         Premedication: On 05/04/2024 the patient was provided with instructions for contrast allergy premedication protocol with prednisone  and diphenhydramine .  Patient confirmed to have taken prednisone  and diphenhydramine  as ordered.  DOS: 05/05/2024  Performed by: Candi Chafe, MD  Purpose: Diagnostic/Therapeutic Indications: Low back and lower extremity pain severe enough to impact quality of life or function. Rationale (medical necessity): procedure needed and proper for the diagnosis and/or treatment of Julie Acevedo's medical symptoms and needs. 1. Radicular pain of lower extremity (Left)   2. Lumbar radiculopathy (S1) (Left)   3. Lumbosacral radiculopathy at S1 (Left)   4. Chronic lower extremity pain (2ry area of Pain) (Right)   5. Degeneration of intervertebral disc of lumbar region with lower extremity  pain   6. Chronic low back pain (1ry area of Pain) (Bilateral) (R>L) w/o sciatica   7. Failed back surgical syndrome   8. History of allergy to radiographic contrast media    NAS-11 Pain score:   Pre-procedure: 3 /10   Post-procedure: 3 /10     Target: Lumbosacral epidural canal  Location: Epidural space  Region: Caudal canal Approach: Percutaneous  Type of procedure: Epidural block  Position  Prep  Materials:  Position: Prone  Prep solution: ChloraPrep (2% chlorhexidine  gluconate and 70% isopropyl alcohol) Prep Area: Entire posterior lumbosacral area  Materials:  Tray: Epidural Needle(s):  Type: Epidural  Gauge (G): 17  Length: 3.5-in  Qty: 1  H&P (Pre-op Assessment):  Julie Acevedo is a 73 y.o. (year old), female patient, seen today for interventional treatment. She  has a past surgical history that includes Hemicolectomy; Lumbar disc surgery; Roux-en-Y Gastric Bypass (2010); Cholecystectomy; Eye surgery; Hand surgery; Foot surgery; Abdominal hysterectomy; Tonsillectomy; Upper gi endoscopy (01/12/2016); Cardiac catheterization (2000); Laparoscopic lysis of adhesions (N/A, 08/21/2016); Ventral hernia repair (N/A, 08/21/2016); Insertion of mesh (N/A, 08/21/2016); Cystocele repair (N/A, 03/19/2017); Cystoscopy (N/A, 03/19/2017); Laparoscopic removal abdominal mass; LOOP RECORDER INSERTION (N/A, 03/21/2018); laparotomy (N/A, 04/16/2019); XI robotic assisted ventral hernia (N/A, 12/08/2019); Insertion of mesh (N/A, 12/08/2019); Breast excisional biopsy (Right, 1971); Small intestine surgery; and Esophagogastroduodenoscopy (egd) with propofol  (N/A, 12/15/2021). Julie Acevedo has a current medication list which includes the following prescription(s): accu-chek guide, accu-chek softclix lancets, atorvastatin , blood glucose meter kit and supplies, calcitriol , diazoxide , diphenhydramine , diphenhydramine , gabapentin , hydroxyzine , ibuprofen, levetiracetam , linaclotide , metoprolol  succinate,  mirtazapine , omeprazole , ondansetron , prednisone , rivastigmine , rosuvastatin , trazodone , and triamcinolone  cream. Her primarily concern today is the Back Pain (lower)  Initial Vital Signs:  Pulse/HCG Rate: 65  Temp: (!) 97.4 F (36.3 C) Resp: 14 BP: (!) 155/73 SpO2: 100 %  BMI: Estimated body mass index is 24.27 kg/m as calculated from the  following:   Height as of this encounter: 5\' 3"  (1.6 m).   Weight as of this encounter: 137 lb (62.1 kg).  Risk Assessment: Allergies: Reviewed. She is allergic to lotrel [amlodipine  besy-benazepril hcl], contrast media [iodinated contrast media], niacin, and sulfa antibiotics.  Allergy Precautions: None required Coagulopathies: Reviewed. None identified.  Blood-thinner therapy: None at this time Active Infection(s): Reviewed. None identified. Julie Acevedo is afebrile  Site Confirmation: Julie Acevedo was asked to confirm the procedure and laterality before marking the site Procedure checklist: Completed Consent: Before the procedure and under the influence of no sedative(s), amnesic(s), or anxiolytics, the patient was informed of the treatment options, risks and possible complications. To fulfill our ethical and legal obligations, as recommended by the American Medical Association's Code of Ethics, I have informed the patient of my clinical impression; the nature and purpose of the treatment or procedure; the risks, benefits, and possible complications of the intervention; the alternatives, including doing nothing; the risk(s) and benefit(s) of the alternative treatment(s) or procedure(s); and the risk(s) and benefit(s) of doing nothing. The patient was provided information about the general risks and possible complications associated with the procedure. These may include, but are not limited to: failure to achieve desired goals, infection, bleeding, organ or nerve damage, allergic reactions, paralysis, and death. In addition, the patient was informed of  those risks and complications associated to Spine-related procedures, such as failure to decrease pain; infection (i.e.: Meningitis, epidural or intraspinal abscess); bleeding (i.e.: epidural hematoma, subarachnoid hemorrhage, or any other type of intraspinal or peri-dural bleeding); organ or nerve damage (i.e.: Any type of peripheral nerve, nerve root, or spinal cord injury) with subsequent damage to sensory, motor, and/or autonomic systems, resulting in permanent pain, numbness, and/or weakness of one or several areas of the body; allergic reactions; (i.e.: anaphylactic reaction); and/or death. Furthermore, the patient was informed of those risks and complications associated with the medications. These include, but are not limited to: allergic reactions (i.e.: anaphylactic or anaphylactoid reaction(s)); adrenal axis suppression; blood sugar elevation that in diabetics may result in ketoacidosis or comma; water  retention that in patients with history of congestive heart failure may result in shortness of breath, pulmonary edema, and decompensation with resultant heart failure; weight gain; swelling or edema; medication-induced neural toxicity; particulate matter embolism and blood vessel occlusion with resultant organ, and/or nervous system infarction; and/or aseptic necrosis of one or more joints. Finally, the patient was informed that Medicine is not an exact science; therefore, there is also the possibility of unforeseen or unpredictable risks and/or possible complications that may result in a catastrophic outcome. The patient indicated having understood very clearly. We have given the patient no guarantees and we have made no promises. Enough time was given to the patient to ask questions, all of which were answered to the patient's satisfaction. Ms. Hendershott has indicated that she wanted to continue with the procedure. Attestation: I, the ordering provider, attest that I have discussed with the patient the  benefits, risks, side-effects, alternatives, likelihood of achieving goals, and potential problems during recovery for the procedure that I have provided informed consent. Date  Time: 05/05/2024  8:01 AM  Imaging Guidance (Spinal):          Type of Imaging Technique: Fluoroscopy Guidance (Spinal) Indication(s): Fluoroscopy guidance for needle placement to enhance accuracy in procedures requiring precise needle localization for targeted delivery of medication in or near specific anatomical locations not easily accessible without such real-time imaging assistance. Exposure Time: Please see nurses notes.  Contrast: Before injecting any contrast, we confirmed that the patient did not have an allergy to iodine, shellfish, or radiological contrast. Once satisfactory needle placement was completed at the desired level, radiological contrast was injected. Contrast injected under live fluoroscopy. No contrast complications. See chart for type and volume of contrast used. Fluoroscopic Guidance: I was personally present during the use of fluoroscopy. "Tunnel Vision Technique" used to obtain the best possible view of the target area. Parallax error corrected before commencing the procedure. "Direction-depth-direction" technique used to introduce the needle under continuous pulsed fluoroscopy. Once target was reached, antero-posterior, oblique, and lateral fluoroscopic projection used confirm needle placement in all planes. Images permanently stored in EMR. Interpretation: I personally interpreted the imaging intraoperatively. Adequate needle placement confirmed in multiple planes. Appropriate spread of contrast into desired area was observed. No evidence of afferent or efferent intravascular uptake. No intrathecal or subarachnoid spread observed. Permanent images saved into the patient's record.  Pre-Procedure Preparation:  Monitoring: As per clinic protocol. Respiration, ETCO2, SpO2, BP, heart rate and rhythm  monitor placed and checked for adequate function Safety Precautions: Patient was assessed for positional comfort and pressure points before starting the procedure. Time-out: I initiated and conducted the "Time-out" before starting the procedure, as per protocol. The patient was asked to participate by confirming the accuracy of the "Time Out" information. Verification of the correct person, site, and procedure were performed and confirmed by me, the nursing staff, and the patient. "Time-out" conducted as per Joint Commission's Universal Protocol (UP.01.01.01). Time: 0825 Start Time: 0825 hrs.  Description  Narrative of Procedure:          Start Time: 0825 hrs.  Technical description of procedure:  Safety Precautions: Aspiration looking for blood return was conducted prior to all injections. At no point did we inject any substances, as a needle was being advanced. No attempts were made at seeking any paresthesias. Safe injection practices and needle disposal techniques used. Medications properly checked for expiration dates. SDV (single dose vial) medications used. Description of the Procedure: Protocol guidelines were followed. The patient was placed in position over the fluoroscopy table. The target area was identified and the area prepped in the usual manner. Skin & deeper tissues infiltrated with local anesthetic. Appropriate amount of time allowed to pass for local anesthetics to take effect. The procedure needle was then advanced to the target area. Proper needle placement secured. Negative aspiration confirmed.  Solution injected in intermittent fashion, asking for systemic symptoms every 0.5cc of injectate. The needle/catheter were removed and the area cleansed, making sure to leave some of the prepping solution back to take advantage of its long term bactericidal properties.  Vitals:   05/05/24 0820 05/05/24 0825 05/05/24 0830 05/05/24 0837  BP: (!) 146/95 136/85 136/79 121/87  Pulse: 63 (!)  58 60 (!) 59  Resp: 16 (!) 23  16  Temp:      TempSrc:      SpO2: 100% 99% 98% 100%  Weight:      Height:         End Time: 0830 hrs.  Post-operative Assessment:  Post-procedure Vital Signs:  Pulse/HCG Rate: (!) 59  Temp: (!) 97.4 F (36.3 C) Resp: 16 BP: 121/87 SpO2: 100 %  EBL: None  Complications: No immediate post-treatment complications observed by team, or reported by patient.  Note: The patient tolerated the entire procedure well. A repeat set of vitals were taken after the procedure and the patient was kept under observation following institutional policy, for  this type of procedure. Post-procedural neurological assessment was performed, showing return to baseline, prior to discharge. The patient was provided with post-procedure discharge instructions, including a section on how to identify potential problems. Should any problems arise concerning this procedure, the patient was given instructions to immediately contact us , at any time, without hesitation. In any case, we plan to contact the patient by telephone for a follow-up status report regarding this interventional procedure.  Comments:  No additional relevant information.  Plan of Care (POC)  Orders:  Orders Placed This Encounter  Procedures   Caudal Epidural Injection    Scheduling Instructions:     Laterality: Left-sided     Level(s): Sacrococcygeal canal (Tailbone area)     Sedation: Patient's choice     Timeframe: Today    Where will this procedure be performed?:   [facility]   DG [facility] C-ARM 1-60 MIN NO REPORT    Intraoperative interpretation by procedural physician at [facility].    Standing Status:   Standing    Number of Occurrences:   1    Reason for exam::   Assistance in needle guidance and placement for procedures requiring needle placement in or near specific anatomical locations not easily accessible without such assistance.   Informed Consent Details:  Physician/Practitioner Attestation; Transcribe to consent form and obtain patient signature    Nursing Order: Transcribe to consent form and obtain patient signature. Note: Always confirm laterality of pain with Ms. Ramiro Burly, before procedure.    Physician/Practitioner attestation of informed consent for procedure/surgical case:   I, the physician/practitioner, attest that I have discussed with the patient the benefits, risks, side effects, alternatives, likelihood of achieving goals and potential problems during recovery for the procedure that I have provided informed consent.    Procedure:   Caudal epidural steroid injection    Physician/Practitioner performing the procedure:   Sameeha Rockefeller A. Barth Borne, MD    Indication/Reason:   Low back pain and lower extremity pain secondary to lumbosacral radiculitis   Provide equipment / supplies at bedside    Procedural tray: Epidural Tray (Disposable  single use) Skin infiltration needle: Regular 1.5-in, 25-G, (x1) Block needle size: Regular standard Catheter: No catheter required    Standing Status:   Standing    Number of Occurrences:   1    Specify:   Epidural Tray   Saline lock IV    Have LR 828 140 2555 mL available and administer at 125 mL/hr if patient becomes hypotensive.    Standing Status:   Standing    Number of Occurrences:   1   Miscellanous precautions    Standing Status:   Standing    Number of Occurrences:   1   Chronic Opioid Analgesic:   No chronic opioid analgesics therapy prescribed by our practice. None MME/day: 0 mg/day    Medications ordered for procedure: Meds ordered this encounter  Medications   iohexol  (OMNIPAQUE ) 180 MG/ML injection 10 mL    Must be Myelogram-compatible. If not available, you may substitute with a water -soluble, non-ionic, hypoallergenic, myelogram-compatible radiological contrast medium.   lidocaine  (XYLOCAINE ) 2 % (with pres) injection 400 mg   pentafluoroprop-tetrafluoroeth (GEBAUERS) aerosol    midazolam  (VERSED ) injection 0.5-2 mg    Make sure Flumazenil is available in the pyxis when using this medication. If oversedation occurs, administer 0.2 mg IV over 15 sec. If after 45 sec no response, administer 0.2 mg again over 1 min; may repeat at 1 min intervals; not to exceed 4  doses (1 mg)   sodium chloride  flush (NS) 0.9 % injection 2 mL   ropivacaine  (PF) 2 mg/mL (0.2%) (NAROPIN ) injection 2 mL   triamcinolone  acetonide (KENALOG -40) injection 40 mg   Medications administered: We administered iohexol , lidocaine , pentafluoroprop-tetrafluoroeth, midazolam , sodium chloride  flush, ropivacaine  (PF) 2 mg/mL (0.2%), and triamcinolone  acetonide.  See the medical record for exact dosing, route, and time of administration.  Follow-up plan:   Return in about 2 weeks (around 05/19/2024) for (Face2F), (PPE).       Interventional Therapies  Risk Factors  Considerations:  Cardiologist: Burney Carter, MD ([facility])  Neurologist: Devora Folks, MD [facility] neurology) Hx of MI (1995)  Hx of TIAs  T2DM  Stage 3b CKD  [name]  Memory impairment  Hx of Bowel Obstruction (04/2019)  GERD  s/p Gastric Bypass for obesity  Malnutrition-malabsorption  Hx. Seizures      Planned  Pending:   Diagnostic left caudal ESI #1 (contrast allergy protocol ordered)    Under consideration:   Diagnostic left caudal ESI #1  Diagnostic bilateral lumbar facet MBB #2  Diagnostic/therapeutic right IA hip joint injection #1    Completed:   Diagnostic bilateral lumbar facet MBB x1 (09/10/2023) (100/100/80 x 1 month)    Therapeutic  Palliative (PRN) options:   None established   Completed by other providers:   Therapeutic/diagnostic right L5 TFESI x2 (04/11/2016, 05/02/2016) by Earvin Goldberg, DO [facility])  Therapeutic/diagnostic right S1 TFESI x2 (04/11/2016, 05/02/2016) by Earvin Goldberg, DO [facility])  Therapeutic/diagnostic right L3 TFESI x1 (07/19/2016) by Earvin Goldberg, DO [facility])        Recent Visits Date Type Provider Dept  05/04/24 Office Visit Renaldo Caroli, MD [facility]  02/13/24 Office Visit Renaldo Caroli, MD [facility]  Showing recent visits within past 90 days and meeting all other requirements Today's Visits Date Type Provider Dept  05/05/24 Procedure visit Renaldo Caroli, MD [facility]  Showing today's visits and meeting all other requirements Future Appointments Date Type Provider Dept  06/08/24 Appointment Renaldo Caroli, MD [facility]  Showing future appointments within next 90 days and meeting all other requirements  Disposition: Discharge home  Discharge (Date  Time): 05/05/2024; 0838 hrs.   Primary Care Physician: Mimi Alt, MD Location: [facility] Note by: Candi Chafe, MD (TTS technology used. I apologize for any typographical errors that were not detected and corrected.) Date: 05/05/2024; Time: 9:14 AM  Disclaimer:  Medicine is not an Visual merchandiser. The only guarantee in medicine is that nothing is guaranteed. It is important to note that the decision to proceed with this intervention was based on the information collected from the patient. The Data and conclusions were drawn from the patient's questionnaire, the interview, and the physical examination. Because the information was provided in large part by the patient, it cannot be guaranteed that it has not been purposely or unconsciously manipulated. Every effort has been made to obtain as much relevant data as possible for this evaluation. It is important to note that the conclusions that lead to this procedure are derived in large part from the available data. Always take into account that the treatment will also be dependent on availability of resources and existing treatment guidelines, considered by other Pain Management Practitioners as being common knowledge and practice, at the  time of the intervention. For Medico-Legal purposes, it is also important to point out that variation in procedural techniques and pharmacological choices are the  acceptable norm. The indications, contraindications, technique, and results of the above procedure should only be interpreted and judged by a Board-Certified Interventional Pain Specialist with extensive familiarity and expertise in the same exact procedure and technique.

## 2024-05-06 ENCOUNTER — Telehealth: Payer: Self-pay

## 2024-05-06 NOTE — Telephone Encounter (Signed)
 Post procedure follow up.  LM

## 2024-05-12 ENCOUNTER — Ambulatory Visit: Payer: Self-pay | Admitting: *Deleted

## 2024-05-12 NOTE — Telephone Encounter (Signed)
 Copied from CRM [id number]. Topic: Clinical - Red Word Triage >> May 12, 2024  2:52 PM [name] wrote: Patient is feeling nauseous in the morning/ balance is off Reason for Disposition  [1] MODERATE dizziness ([name], interferes with normal activities) AND [2] has been evaluated by doctor (or NP/PA) for this  Answer Assessment - Initial Assessment Questions 1. DESCRIPTION: "Describe your dizziness."     I'm dizzy and I'm off balance and having bad nausea.    Especially when I get up in the morning.   I'm so nauseated.   It's hard to take my medications.   I have medication for the dizziness.   It left for a while and now it's back real bad.   It's not vertigo.    Julie Acevedo gave me medicine for the nausea.   It helped.   All of a sudden the nausea is back.   I'm tired of this nausea and dizziness.   This has been going on for 6-7 months now.     2. LIGHTHEADED: "Do you feel lightheaded?" ([name], somewhat faint, woozy, weak upon standing)     I feel dizzy and off balance.   The nausea is bad.   Only happens in the mornings.      3. VERTIGO: "Do you feel like either you or the room is spinning or tilting?" (i.e. vertigo)     No  4. SEVERITY: "How bad is it?"  "Do you feel like you are going to faint?" "Can you stand and walk?"   - MILD: Feels slightly dizzy, but walking normally.   - MODERATE: Feels unsteady when walking, but not falling; interferes with normal activities ([name], school, work).   - SEVERE: Unable to walk without falling, or requires assistance to walk without falling; feels like passing out now.      Moderate  5. ONSET:  "When did the dizziness begin?"     6-7 months ago 6. AGGRAVATING FACTORS: "Does anything make it worse?" ([name], standing, change in head position)     Not sure 7. HEART RATE: "Can you tell me your heart rate?" "How many beats in 15 seconds?"  (Note: not all patients can do this)       Not asked 8. CAUSE: "What do you think is causing the dizziness?"     I don't  know 9. RECURRENT SYMPTOM: "Have you had dizziness before?" If Yes, ask: "When was the last time?" "What happened that time?"     Yes   10. OTHER SYMPTOMS: "Do you have any other symptoms?" ([name], fever, chest pain, vomiting, diarrhea, bleeding)       Nausea   off balnce 11. PREGNANCY: "Is there any chance you are pregnant?" "When was your last menstrual period?"       N/A due to age  Protocols used: Dizziness - Lightheadedness-A-AH  Chief Complaint: Bad nausea only in the mornings and feeling off balance. Symptoms: This nausea is awful.   I had medicine Julie Acevedo used to give me for the nausea but I'm out of it.   I've been dealing with this for 6-7 months now. Frequency: Going on for 6-7 months. Pertinent Negatives: Patient denies having any medicine for the nausea Disposition: [] ED /[] Urgent Care (no appt availability in office) / [x] Appointment(In office/virtual)/ []  [place] [place] [facility]/ [] Home Care/ [] Refused Recommended Disposition /[] [place] [facility]/ []  Follow-up with PCP Additional Notes: Appt made with Julie Acevedo for 06/11/2024 at 9:20.   I put her on  the wait list in case of a cancellation.   I'm sending a message to see if she can be worked in sooner.

## 2024-05-13 NOTE — Telephone Encounter (Signed)
 Julie Acevedo

## 2024-05-13 NOTE — Telephone Encounter (Signed)
 She has already been moved to 05/19/2024

## 2024-05-14 NOTE — Telephone Encounter (Signed)
 Julie Acevedo

## 2024-05-19 ENCOUNTER — Encounter: Payer: Self-pay | Admitting: Family Medicine

## 2024-05-19 ENCOUNTER — Ambulatory Visit (INDEPENDENT_AMBULATORY_CARE_PROVIDER_SITE_OTHER): Admitting: Family Medicine

## 2024-05-19 VITALS — BP 117/59 | HR 52 | Temp 98.1°F | Ht 63.0 in | Wt 138.0 lb

## 2024-05-19 DIAGNOSIS — R110 Nausea: Secondary | ICD-10-CM | POA: Diagnosis not present

## 2024-05-19 DIAGNOSIS — I10 Essential (primary) hypertension: Secondary | ICD-10-CM | POA: Diagnosis not present

## 2024-05-19 DIAGNOSIS — N644 Mastodynia: Secondary | ICD-10-CM

## 2024-05-19 DIAGNOSIS — R42 Dizziness and giddiness: Secondary | ICD-10-CM

## 2024-05-19 MED ORDER — MECLIZINE HCL 12.5 MG PO TABS: 12.5000 mg | ORAL_TABLET | Freq: Three times a day (TID) | ORAL | 0 refills | Status: DC | PRN

## 2024-05-19 MED ORDER — ONDANSETRON 4 MG PO TBDP: 4.0000 mg | ORAL_TABLET | Freq: Three times a day (TID) | ORAL | 1 refills | Status: DC | PRN

## 2024-05-19 NOTE — Progress Notes (Signed)
 Established patient visit   Patient: Julie Acevedo   DOB: 17-Sep-1951   73 y.o. Female  MRN: [id number] Visit Date: 05/19/2024  Today's healthcare provider: Mimi Alt, MD   Chief Complaint  Patient presents with   Dizziness    Reports when she gets up in the morning she feels nauseous and will not take medications until she feels as though she can keep it down.  When she stands up she states she has the feeling of being drunk or dizzy.  States when she is walking sometimes she is leaning a little bit.  States it has been going on for about 2 months.     Subjective     HPI     Dizziness    Additional comments: Reports when she gets up in the morning she feels nauseous and will not take medications until she feels as though she can keep it down.  When she stands up she states she has the feeling of being drunk or dizzy.  States when she is walking sometimes she is leaning a little bit.  States it has been going on for about 2 months.        Last edited by Michaelene Admire L on 05/19/2024 11:06 AM.       Discussed the use of AI scribe software for clinical note transcription with the patient, who gave verbal consent to proceed.  History of Present Illness Julie Acevedo is a 73 year old female who presents with dizziness and nausea.  She has been experiencing dizziness and nausea for the past two months. The dizziness primarily occurs in the morning and when standing, described as a sensation akin to being drunk. She sometimes leans while walking, and the dizziness is most pronounced when she initially stands up but improves after a short period. She has not been taking her medications until she feels she can keep them down due to nausea.  Nausea is present upon waking and when she puts her feet on the floor, persisting for a while. Despite feeling nauseated, she does not vomit. She previously used Zofran  for nausea, which was helpful, but she has run out of it.  Her  current medications include hydroxyzine , Keppra  500 mg twice daily, metoprolol  25 mg daily, and rivastigmine . She has a history of orthostatic dyspnea and transient ischemic attack (TIA). Her blood pressure was noted to be elevated at 159/72, but she reports taking her blood pressure medication regularly.  Additionally, she mentions a new concern of a tender knot in her left breast, which she noticed about a month ago. She has scheduled a mammogram following this discovery.  No current dizziness but dizziness upon standing. Reports nausea without vomiting. No weakness in legs, but difficulty walking in high heels. No recent illness preceding the onset of symptoms.     Past Medical History:  Diagnosis Date   Anemia    Arthritis    COVID-19 11/19/2019   Diverticulitis    DM (diabetes mellitus) (HCC)    typ e 2   History of methicillin resistant staphylococcus aureus (MRSA) 2017   HTN (hypertension)    Hyperlipidemia    L-S radiculopathy 06/11/2015   Left leg swelling 01/22/2023   Lower abdominal pain 08/08/2022   Memory difficulties 09/01/2015   Multiple thyroid  nodules    Myocardial infarction [facility]) 1995   Partial small bowel obstruction (HCC) 04/23/2016   SBO (small bowel obstruction) (HCC)    Short-term memory loss  Thyroid  nodule     Medications: Outpatient Medications Prior to Visit  Medication Sig   ACCU-CHEK GUIDE test strip USE TO CHECK BLOOD SUGAR UP TO 4 TIMES A DAY AS DIRECTED   Accu-Chek Softclix Lancets lancets SMARTSIG:Topical 1-4 Times Daily   atorvastatin  (LIPITOR) 40 MG tablet Take 1 tablet by mouth every morning.   blood glucose meter kit and supplies Dispense Accu-Chek Guide. Use up to four times daily as directed. 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   calcitRIOL  (ROCALTROL ) 0.25 MCG capsule Take 0.25 mcg by mouth daily.   diazoxide  (PROGLYCEM ) 50 MG/ML suspension take 1ml (50mg  total) BY MOUTH EVERY 8 HOURS   diphenhydrAMINE  (BENADRYL ) 50 MG capsule Take 50 mg  by mouth 1 hour before procedure. Must have a driver.   diphenhydrAMINE  (BENADRYL ) 50 MG capsule Take 50 mg by mouth 1 hour before procedure. Must have a driver.   gabapentin  (NEURONTIN ) 300 MG capsule TAKE 1 CAPSULE BY MOUTH 3 TIMES DAILY   hydrOXYzine  (VISTARIL ) 100 MG capsule TAKE 1 CAPSULE BY MOUTH 3 TIMES DAILY   ibuprofen (ADVIL) 600 MG tablet Take 600 mg by mouth every 6 (six) hours as needed.   levETIRAcetam  (KEPPRA ) 500 MG tablet Take 1 tablet (500 mg total) by mouth 2 (two) times daily.   linaclotide  (LINZESS ) 145 MCG CAPS capsule Take 1 capsule (145 mcg total) by mouth daily before breakfast.   metoprolol  succinate (TOPROL -XL) 25 MG 24 hr tablet TAKE 1 TABLET BY MOUTH EVERY MORNING   mirtazapine  (REMERON  SOL-TAB) 15 MG disintegrating tablet TAKE 1 TABLET BY MOUTH EVERYDAY AT BEDTIME   omeprazole  (PRILOSEC) 20 MG capsule TAKE 1 CAPSULE BY MOUTH ONCE DAILY   ondansetron  (ZOFRAN -ODT) 4 MG disintegrating tablet Take 4 mg by mouth every 8 (eight) hours as needed.   predniSONE  (DELTASONE ) 50 MG tablet Take 1 tab PO 13 hrs, 7 hrs, and 1 hr before contrast media injection (procedure).   rivastigmine  (EXELON ) 3 MG capsule Take 3 mg by mouth 2 (two) times daily.   rosuvastatin  (CRESTOR ) 40 MG tablet Take 1 tablet (40 mg total) by mouth daily.   traZODone  (DESYREL ) 50 MG tablet Take 1 tablet by mouth at bedtime.   triamcinolone  cream (KENALOG ) 0.5 % Apply 1 Application topically 3 (three) times daily as needed (flares).   No facility-administered medications prior to visit.    Review of Systems  Last metabolic panel Lab Results  Component Value Date   GLUCOSE 109 (H) 01/28/2024   NA 144 01/28/2024   K 4.2 01/28/2024   CL 107 (H) 01/28/2024   CO2 25 01/28/2024   BUN 10 01/28/2024   CREATININE 1.32 (H) 01/28/2024   EGFR 43 (L) 01/28/2024   CALCIUM  9.6 01/28/2024   PHOS 3.4 11/20/2022   PROT 7.5 06/19/2023   ALBUMIN 4.0 06/19/2023   LABGLOB 2.6 12/05/2020   AGRATIO 1.5 12/05/2020    BILITOT 0.5 06/19/2023   ALKPHOS 209 (H) 06/19/2023   AST 61 (H) 06/19/2023   ALT 53 (H) 06/19/2023   ANIONGAP 8 06/19/2023   Last hemoglobin A1c Lab Results  Component Value Date   HGBA1C 6.4 11/01/2023        Objective    BP (!) 159/72 (BP Location: Right Arm, Patient Position: Sitting, Cuff Size: Normal)   Pulse (!) 53   Temp 98.1 F (36.7 C) (Oral)   Ht 5\' 3"  (1.6 m)   Wt 138 lb (62.6 kg)   SpO2 100%   BMI 24.45 kg/m  BP Readings from Last  3 Encounters:  05/19/24 (!) 117/59  05/05/24 121/87  05/04/24 (!) 143/77   Wt Readings from Last 3 Encounters:  05/19/24 138 lb (62.6 kg)  05/05/24 137 lb (62.1 kg)  05/04/24 137 lb (62.1 kg)        Physical Exam  Physical Exam VITALS: BP- 115/50 BREAST: Tenderness at the twelve o'clock and two or three o'clock positions on the left breast. Neuro: patient appears unsteady on feet while ambulating, she does not have assistive device with her during today's appointment, sways while standing but regains balance after standing for 90 secs, shuffling gait   No results found for any visits on 05/19/24.  Assessment & Plan     Problem List Items Addressed This Visit   None   Assessment and Plan Assessment & Plan Dizziness and Nausea Experiences dizziness and nausea primarily upon standing for two months, with symptoms resembling vertigo. Blood pressure initially elevated at 159/72, later readings showed 115/50s. Suspected vertigo; dehydration may contribute to orthostatic symptoms. History of orthostatic dyspnea and previous diuretic use. Current medications include Keppra , metoprolol , and rivastigmine , which may contribute to symptoms. - Prescribe meclizine 12.5 mg for dizziness, up to three times daily, with informed consent regarding potential side effects. - Refill Zofran  4 mg for nausea as needed. - Encourage adequate fluid intake to prevent dehydration. - Discuss dizziness with neurologist during next  visit.  Transient Ischemic Attack (TIA) Previous TIA may contribute to current dizziness and balance issues. Physical therapy was utilized a month ago, and a cane was recommended for balance support. - Discuss dizziness and balance issues with neurologist. - Utilize cane for balance support.  Hypertension Blood pressure initially elevated at 159/72, later measured at 115/50s. On metoprolol  and advised to maintain hydration to avoid orthostatic hypotension. - Continue current antihypertensive regimen. - Monitor blood pressure regularly.  Breast Lump Tender lump in the left breast noted a month ago at the twelve o'clock and possibly two or three o'clock positions. Diagnostic mammogram and ultrasound ordered for further investigation. - Order diagnostic mammogram and breast ultrasound.     No follow-ups on file.         Mimi Alt, MD  [facility] [phone] (phone) [phone] (fax)  [facility]

## 2024-05-25 ENCOUNTER — Ambulatory Visit
Admission: RE | Admit: 2024-05-25 | Discharge: 2024-05-25 | Disposition: A | Discharge status: Home / Self Care | Source: Ambulatory Visit | Attending: Family Medicine | Admitting: Family Medicine

## 2024-05-25 DIAGNOSIS — N6325 Unspecified lump in the left breast, overlapping quadrants: Secondary | ICD-10-CM | POA: Diagnosis not present

## 2024-05-25 DIAGNOSIS — N644 Mastodynia: Secondary | ICD-10-CM | POA: Insufficient documentation

## 2024-05-25 DIAGNOSIS — R92333 Mammographic heterogeneous density, bilateral breasts: Secondary | ICD-10-CM | POA: Diagnosis not present

## 2024-05-25 DIAGNOSIS — K529 Noninfective gastroenteritis and colitis, unspecified: Secondary | ICD-10-CM | POA: Diagnosis not present

## 2024-05-26 DIAGNOSIS — E785 Hyperlipidemia, unspecified: Secondary | ICD-10-CM | POA: Diagnosis not present

## 2024-05-26 DIAGNOSIS — Z9884 Bariatric surgery status: Secondary | ICD-10-CM | POA: Diagnosis not present

## 2024-05-26 DIAGNOSIS — R809 Proteinuria, unspecified: Secondary | ICD-10-CM | POA: Diagnosis not present

## 2024-05-26 DIAGNOSIS — E119 Type 2 diabetes mellitus without complications: Secondary | ICD-10-CM | POA: Diagnosis not present

## 2024-05-26 DIAGNOSIS — Z9049 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: Secondary | ICD-10-CM | POA: Diagnosis not present

## 2024-05-26 DIAGNOSIS — E876 Hypokalemia: Secondary | ICD-10-CM | POA: Diagnosis not present

## 2024-05-26 DIAGNOSIS — N1832 Chronic kidney disease, stage 3b: Secondary | ICD-10-CM | POA: Diagnosis not present

## 2024-05-26 DIAGNOSIS — N2581 Secondary hyperparathyroidism of renal origin: Secondary | ICD-10-CM | POA: Diagnosis not present

## 2024-05-26 DIAGNOSIS — D631 Anemia in chronic kidney disease: Secondary | ICD-10-CM | POA: Diagnosis not present

## 2024-05-26 DIAGNOSIS — I10 Essential (primary) hypertension: Secondary | ICD-10-CM | POA: Diagnosis not present

## 2024-05-26 DIAGNOSIS — R8290 Unspecified abnormal findings in urine: Secondary | ICD-10-CM | POA: Diagnosis not present

## 2024-06-03 DIAGNOSIS — K529 Noninfective gastroenteritis and colitis, unspecified: Secondary | ICD-10-CM | POA: Diagnosis not present

## 2024-06-04 ENCOUNTER — Ambulatory Visit: Payer: Self-pay | Admitting: Family Medicine

## 2024-06-04 LAB — CBC
Hematocrit: 34.8 % (ref 34.0–46.6)
Hemoglobin: 10.9 g/dL — ABNORMAL LOW (ref 11.1–15.9)
MCH: 25.6 pg — ABNORMAL LOW (ref 26.6–33.0)
MCHC: 31.3 g/dL — ABNORMAL LOW (ref 31.5–35.7)
MCV: 82 fL (ref 79–97)
Platelets: 209 10*3/uL (ref 150–450)
RBC: 4.26 x10E6/uL (ref 3.77–5.28)
RDW: 14 % (ref 11.7–15.4)
WBC: 6.1 10*3/uL (ref 3.4–10.8)

## 2024-06-04 LAB — COMPREHENSIVE METABOLIC PANEL WITH GFR
ALT: 19 IU/L (ref 0–32)
AST: 27 IU/L (ref 0–40)
Albumin: 3.8 g/dL (ref 3.8–4.8)
Alkaline Phosphatase: 174 IU/L — ABNORMAL HIGH (ref 44–121)
BUN/Creatinine Ratio: 8 — ABNORMAL LOW (ref 12–28)
BUN: 12 mg/dL (ref 8–27)
Bilirubin Total: 0.4 mg/dL (ref 0.0–1.2)
CO2: 22 mmol/L (ref 20–29)
Calcium: 8.7 mg/dL (ref 8.7–10.3)
Chloride: 108 mmol/L — ABNORMAL HIGH (ref 96–106)
Creatinine, Ser: 1.46 mg/dL — ABNORMAL HIGH (ref 0.57–1.00)
Globulin, Total: 2 g/dL (ref 1.5–4.5)
Glucose: 91 mg/dL (ref 70–99)
Potassium: 3.7 mmol/L (ref 3.5–5.2)
Sodium: 145 mmol/L — ABNORMAL HIGH (ref 134–144)
Total Protein: 5.8 g/dL — ABNORMAL LOW (ref 6.0–8.5)
eGFR: 38 mL/min/{1.73_m2} — ABNORMAL LOW (ref >59)

## 2024-06-04 LAB — C-REACTIVE PROTEIN: CRP: 1 mg/L (ref 0–10)

## 2024-06-04 LAB — TSH: TSH: 1.58 u[IU]/mL (ref 0.450–4.500)

## 2024-06-04 LAB — SEDIMENTATION RATE: Sed Rate: 13 mm/h (ref 0–40)

## 2024-06-07 NOTE — Progress Notes (Unsigned)
 PROVIDER NOTE: Interpretation of information contained herein should be left to medically-trained personnel. Specific patient instructions are provided elsewhere under Patient Instructions section of medical record. This document was created in part using AI and STT-dictation technology, any transcriptional errors that may result from this process are unintentional.  Patient: Julie Acevedo  Service: E/M   PCP: Sharma Coyer, MD  DOB: 1951/08/03  DOS: 06/08/2024  Provider: Eric DELENA Como, MD  MRN: [id number]  Delivery: Face-to-face  Specialty: Interventional Pain Management  Type: Established Patient  Setting: Ambulatory outpatient facility  Specialty designation: 09  Referring Prov.: Simmons-Robinson, Makie*  Location: Outpatient office facility       History of present illness (HPI) Julie Acevedo, a 73 y.o. year old female, is here today because of her Chronic pain of right lower extremity [M79.604, G89.29]. Julie Acevedo primary complain today is No chief complaint on file.  Pertinent problems: Julie Acevedo has Arthritis; Narrowing of intervertebral disc space; Chronic low back pain of over 3 months duration; Lumbosacral radiculopathy at S1 (Left); Arthritis of knee, degenerative; Failed back surgical syndrome; Acquired trigger finger; Low back pain radiating to lower extremity (Right); PTTD (posterior tibial tendon dysfunction); Chronic pain syndrome; Abnormal CT scan, cervical spine (11/19/2022); Abnormal MRI, lumbar spine (09/25/2023); DDD (degenerative disc disease), cervical; DDD (degenerative disc disease), lumbar; Cervical foraminal stenosis (C3-C7); Chronic low back pain (1ry area of Pain) (Bilateral) (R>L) w/o sciatica; Chronic lower extremity pain (2ry area of Pain) (Right); Osteoarthritis of shoulder (Right); Osteoarthritis of knees (Bilateral); Chronic hip pain (3ry area of Pain) (Right); Lumbar facet joint pain; Lumbar facet joint syndrome; Arthropathy of hip (Right);  Osteoarthritis of hip (Right); 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  Pain Assessment: Severity of   is reported as a  /10. Location:    / . Onset:  . Quality:  . Timing:  . Modifying factor(s):  SABRA Vitals:  vitals were not taken for this visit.  BMI: Estimated body mass index is 24.45 kg/m as calculated from the following:   Height as of 05/19/24: 5' 3 (1.6 m).   Weight as of 05/19/24: 138 lb (62.6 kg).  Last encounter: 05/04/2024. Last procedure: 05/05/2024.  Reason for encounter: post-procedure evaluation and assessment.   Discussed the use of AI scribe software for clinical note transcription with the patient, who gave verbal consent to proceed.  History of Present Illness         Post-Procedure Evaluation   Type: Caudal Epidural Steroid Injection   #2  Laterality: Midline aiming left Level: Sacrococcygeal ligament  Imaging: Fluoroscopy-guided Spinal ([id number]) Anesthesia: Local anesthesia (1-2% Lidocaine ) Anxiolysis: IV Versed  2.0 mg Sedation: No Sedation None required. No Fentanyl  administered.         Premedication: On 05/04/2024 the patient was provided with instructions for contrast allergy premedication protocol with prednisone  and diphenhydramine .  Patient confirmed to have taken prednisone  and diphenhydramine  as ordered.  DOS: 05/05/2024  Performed by: Eric DELENA Como, MD  Purpose: Diagnostic/Therapeutic Indications: Low back and lower extremity pain severe enough to impact quality of life or function. Rationale (medical  necessity): procedure needed and proper for the diagnosis and/or treatment of Ms.  Acevedo medical symptoms and needs. 1. Radicular pain of lower extremity (Left)   2. Lumbar radiculopathy (S1) (Left)   3. Lumbosacral radiculopathy at S1 (Left)   4. Chronic lower extremity pain (2ry area of Pain) (Right)   5. Degeneration of intervertebral disc of lumbar region with lower extremity pain   6. Chronic low back pain (1ry area of Pain) (Bilateral) (R>L) w/o sciatica   7. Failed back surgical syndrome   8. History of allergy to radiographic contrast media    NAS-11 Pain score:   Pre-procedure: 3 /10   Post-procedure: 3 /10     Effectiveness:  Initial hour after procedure:   ***. Subsequent 4-6 hours post-procedure:   ***. Analgesia past initial 6 hours:   ***. Ongoing improvement:  Analgesic:  *** Function:    ***    ROM:    ***      Pharmacotherapy Assessment   Analgesic: No chronic opioid analgesics therapy prescribed by our practice. None MME/day: 0 mg/day   Monitoring: [place] PMP: PDMP reviewed during this encounter.       Pharmacotherapy: No side-effects or adverse reactions reported. Compliance: No problems identified. Effectiveness: Clinically acceptable.  No notes on file  UDS:  Summary  Date Value Ref Range Status  06/19/2023 Note  Final    Comment:    ==================================================================== Compliance Drug Analysis, Ur ==================================================================== Test                             Result       Flag       Units  Drug Present and Declared for Prescription Verification   Gabapentin                      PRESENT      EXPECTED   Mirtazapine                     PRESENT      EXPECTED   Hydroxyzine                     PRESENT      EXPECTED   Metoprolol                      PRESENT      EXPECTED  Drug Absent but Declared for Prescription Verification   Levetiracetam                   Not Detected  UNEXPECTED ==================================================================== Test                      Result    Flag   Units      Ref Range   Creatinine              60               mg/dL      >=79 ==================================================================== Declared Medications:  The flagging and interpretation on this report are based on the  following declared medications.  Unexpected results may arise from  inaccuracies in the declared medications.   **Note: The testing scope of this panel includes these medications:   Gabapentin  (Neurontin )  Hydroxyzine   Levetiracetam  (Keppra )  Metoprolol   Mirtazapine  (Remeron )   **Note: The testing scope of this panel does not include the  following reported medications:   Atorvastatin   Calcitriol   Diazoxide  (Proglycem )  Linaclotide  (Linzess )  Rosuvastatin  (Crestor )  Triamcinolone  (Kenalog ) ==================================================================== For clinical consultation, please call [phone]. ====================================================================     No results found for: CBDTHCR No results found for: D8THCCBX No results found for: D9THCCBX  ROS  Constitutional: Denies any fever or chills Gastrointestinal: No reported hemesis, hematochezia, vomiting, or acute GI distress Musculoskeletal: Denies any acute onset joint swelling, redness, loss of ROM, or weakness Neurological: No reported episodes of acute onset apraxia, aphasia, dysarthria, agnosia, amnesia, paralysis, loss of coordination, or loss of consciousness  Medication Review  Accu-Chek Softclix Lancets, atorvastatin , blood glucose meter kit and supplies, calcitRIOL , diazoxide , diphenhydrAMINE , gabapentin , glucose blood, hydrOXYzine , ibuprofen, levETIRAcetam , linaclotide , meclizine , metoprolol  succinate, mirtazapine , omeprazole , ondansetron , predniSONE , rivastigmine , rosuvastatin , traZODone , and triamcinolone  cream  History  Review  Allergy: Julie Acevedo is allergic to lotrel [amlodipine  besy-benazepril hcl], contrast media [iodinated contrast media], niacin, and sulfa antibiotics. Drug: Julie Acevedo  reports no history of drug use. Alcohol:  reports no history of alcohol use. Tobacco:  reports that she has never smoked. She has never used smokeless tobacco. Social: Julie Acevedo  reports that she has never smoked. She has never used smokeless tobacco. She reports that she does not drink alcohol and does not use drugs. Medical:  has a past medical history of Anemia, Arthritis, COVID-19 (11/19/2019), Diverticulitis, DM (diabetes mellitus) (HCC), History of methicillin resistant staphylococcus aureus (MRSA) (2017), HTN (hypertension), Hyperlipidemia, L-S radiculopathy (06/11/2015), Left leg swelling (01/22/2023), Lower abdominal pain (08/08/2022), Memory difficulties (09/01/2015), Multiple thyroid  nodules, Myocardial infarction (HCC) (1995), Partial small bowel obstruction (HCC) (04/23/2016), SBO (small bowel obstruction) (HCC), Short-term memory loss, and Thyroid  nodule. Surgical: Julie Acevedo  has a past surgical history that includes Hemicolectomy; Lumbar disc surgery; Roux-en-Y Gastric Bypass (2010); Cholecystectomy; Eye surgery; Hand surgery; Foot surgery; Abdominal hysterectomy; Tonsillectomy; Upper gi endoscopy (01/12/2016); Cardiac catheterization (2000); Laparoscopic lysis of adhesions (N/A, 08/21/2016); Ventral hernia repair (N/A, 08/21/2016); Insertion of mesh (N/A, 08/21/2016); Cystocele repair (N/A, 03/19/2017); Cystoscopy (N/A, 03/19/2017); Laparoscopic removal abdominal mass; LOOP RECORDER INSERTION (N/A, 03/21/2018); laparotomy (N/A, 04/16/2019); XI robotic assisted ventral hernia (N/A, 12/08/2019); Insertion of mesh (N/A, 12/08/2019); Breast excisional biopsy (Right, 1971); Small intestine surgery; and Esophagogastroduodenoscopy (egd) with propofol  (N/A, 12/15/2021). Family: family history includes Breast cancer in her  paternal grandmother; Breast cancer (age of onset: 52) in her paternal aunt; Breast cancer (age of onset: 25) in her paternal aunt; COPD in her mother; Cancer in her paternal aunt and paternal grandmother; Cancer (age of onset: 21) in her father; Congestive Heart Failure in her maternal grandmother; Coronary artery disease (age of onset: 10) in her father; Diabetes in her mother; Emphysema in her maternal grandfather; Heart attack in her paternal grandfather; Hypertension in her mother.  Laboratory Chemistry Profile   Renal Lab Results  Component Value Date   BUN 12 06/03/2024   CREATININE 1.46 (H) 06/03/2024   BCR 8 (L) 06/03/2024   GFRAA 42 (L) 12/05/2020   GFRNONAA 42 (L) 06/19/2023    Hepatic Lab Results  Component Value Date   AST 27 06/03/2024   ALT 19 06/03/2024   ALBUMIN 3.8 06/03/2024   ALKPHOS 174 (H) 06/03/2024   LIPASE 27 08/31/2022   AMMONIA 68 (H) 11/15/2012    Electrolytes Lab Results  Component Value Date   NA 145 (H) 06/03/2024   K 3.7 06/03/2024   CL 108 (H) 06/03/2024   CALCIUM  8.7 06/03/2024   MG 2.2 06/19/2023   PHOS 3.4 11/20/2022    Bone Lab Results  Component Value Date   VD25OH 51.23 08/09/2022   25OHVITD1 39  06/19/2023   25OHVITD2 1.0 06/19/2023   25OHVITD3 38 06/19/2023    Inflammation (CRP: Acute Phase) (ESR: Chronic Phase) Lab Results  Component Value Date   CRP 1 06/03/2024   ESRSEDRATE 13 06/03/2024   LATICACIDVEN 1.1 08/21/2022         Note: Above Lab results reviewed.  Recent Imaging Review  US  LIMITED ULTRASOUND INCLUDING AXILLA LEFT BREAST  CLINICAL DATA:  Palpable painful areas in the LEFT breast at 12, 2 and 3  EXAM: DIGITAL DIAGNOSTIC BILATERAL MAMMOGRAM WITH TOMOSYNTHESIS AND CAD; ULTRASOUND LEFT BREAST LIMITED  TECHNIQUE: Bilateral digital diagnostic mammography and breast tomosynthesis was performed. The images were evaluated with computer-aided detection. ; Targeted ultrasound examination of the left breast  was performed. Best images possible per technologist communication.  COMPARISON:  Previous exam(s).  ACR Breast Density Category c: The breasts are heterogeneously dense, which may obscure small masses.  FINDINGS: Spot compression tomosynthesis views were obtained over the palpable/painful area of concern in the LEFT breast. No suspicious mammographic finding is identified in this area. No suspicious mass, microcalcification, or other finding is identified in either breast.  On physical exam, no suspicious mass is appreciated.  Targeted LEFT breast ultrasound was performed in the palpable/painful areas of concern at the upper and upper outer outer breast. No suspicious solid or cystic mass is identified. Cardiac loop recorder is noted at the 12 o'clock position at the site of palpable concern.  IMPRESSION: 1. No mammographic or sonographic evidence of malignancy at the sites of palpable/painful concern in the LEFT breast. Cardiac loop recorder is at the site of palpable concern at 12 o'clock. Any further workup of the patient's symptoms should be based on the clinical assessment. Recommend routine annual screening mammogram in 1 year. 2. No mammographic evidence of malignancy bilaterally. 3. Breast pain is a common condition, which will often resolve on its own without intervention. It can be affected by hormonal changes, medication side effect, weight changes and fit of the bra. Pain may also be referred from other adjacent areas of the body. Breast pain may be improved by wearing adequate well-fitting support, over-the-counter topical and oral NSAID medication, low-fat diet, and ice/heat as needed. Studies have shown an improvement in cyclic pain with use of evening primrose oil, vitamin D  and vitamin E. Clinical follow-up recommended to discuss any further work-up recommendations and appropriate treatment.  RECOMMENDATION: Screening mammogram in one  year.(Code:SM-B-01Y)  I have discussed the findings and recommendations with the patient. If applicable, a reminder letter will be sent to the patient regarding the next appointment.  BI-RADS CATEGORY  1: Negative.  Electronically Signed   By: Corean Salter M.D.   On: 05/25/2024 11:31 MM 3D DIAGNOSTIC MAMMOGRAM BILATERAL BREAST CLINICAL DATA:  Palpable painful areas in the LEFT breast at 12, 2 and 3  EXAM: DIGITAL DIAGNOSTIC BILATERAL MAMMOGRAM WITH TOMOSYNTHESIS AND CAD; ULTRASOUND LEFT BREAST LIMITED  TECHNIQUE: Bilateral digital diagnostic mammography and breast tomosynthesis was performed. The images were evaluated with computer-aided detection. ; Targeted ultrasound examination of the left breast was performed. Best images possible per technologist communication.  COMPARISON:  Previous exam(s).  ACR Breast Density Category c: The breasts are heterogeneously dense, which may obscure small masses.  FINDINGS: Spot compression tomosynthesis views were obtained over the palpable/painful area of concern in the LEFT breast. No suspicious mammographic finding is identified in this area. No suspicious mass, microcalcification, or other finding is identified in either breast.  On physical exam, no suspicious mass is appreciated.  Targeted LEFT breast ultrasound was performed in the palpable/painful areas of concern at the upper and upper outer outer breast. No suspicious solid or cystic mass is identified. Cardiac loop recorder is noted at the 12 o'clock position at the site of palpable concern.  IMPRESSION: 1. No mammographic or sonographic evidence of malignancy at the sites of palpable/painful concern in the LEFT breast. Cardiac loop recorder is at the site of palpable concern at 12 o'clock. Any further workup of the patient's symptoms should be based on the clinical assessment. Recommend routine annual screening mammogram in 1 year. 2. No mammographic evidence  of malignancy bilaterally. 3. Breast pain is a common condition, which will often resolve on its own without intervention. It can be affected by hormonal changes, medication side effect, weight changes and fit of the bra. Pain may also be referred from other adjacent areas of the body. Breast pain may be improved by wearing adequate well-fitting support, over-the-counter topical and oral NSAID medication, low-fat diet, and ice/heat as needed. Studies have shown an improvement in cyclic pain with use of evening primrose oil, vitamin D  and vitamin E. Clinical follow-up recommended to discuss any further work-up recommendations and appropriate treatment.  RECOMMENDATION: Screening mammogram in one year.(Code:SM-B-01Y)  I have discussed the findings and recommendations with the patient. If applicable, a reminder letter will be sent to the patient regarding the next appointment.  BI-RADS CATEGORY  1: Negative.  Electronically Signed   By: Corean Salter M.D.   On: 05/25/2024 11:31 Note: Reviewed        Physical Exam  General appearance: Well nourished, well developed, and well hydrated. In no apparent acute distress Mental status: Alert, oriented x 3 (person, place, & time)       Respiratory: No evidence of acute respiratory distress Eyes: PERLA Vitals: There were no vitals taken for this visit. BMI: Estimated body mass index is 24.45 kg/m as calculated from the following:   Height as of 05/19/24: 5' 3 (1.6 m).   Weight as of 05/19/24: 138 lb (62.6 kg). Ideal: Patient weight not recorded  Assessment   Diagnosis Status  1. Chronic lower extremity pain (2ry area of Pain) (Right)   2. Radicular pain of lower extremity (Left)   3. Lumbosacral radiculopathy at S1 (Left)   4. Low back pain radiating to lower extremity (Right)   5. Chronic low back pain of over 3 months duration   6. Chronic low back pain (1ry area of Pain) (Bilateral) (R>L) w/o sciatica   7. Postop check     Controlled Controlled Controlled   Updated Problems: No problems updated.  Plan of Care  Problem-specific:  Assessment and Plan            Julie Acevedo has a current medication list which includes the following long-term medication(s): accu-chek guide, diphenhydramine , diphenhydramine , gabapentin , levetiracetam , linaclotide , metoprolol  succinate, mirtazapine , omeprazole , and rosuvastatin .  Pharmacotherapy (Medications Ordered): No orders of the defined types were placed in this encounter.  Orders:  No orders of the defined types were placed in this encounter.    Interventional Therapies  Risk Factors  Considerations:  Cardiologist: Cara Lovelace, MD ([facility])  Neurologist: Jannett Fairly, MD [facility]) Hx of MI (1995)  Hx of TIAs  T2DM  Stage 3b CKD  [name]  Memory impairment  Hx of Bowel Obstruction (04/2019)  GERD  s/p Gastric Bypass for obesity  Malnutrition-malabsorption  Hx. Seizures      Planned  Pending:  Diagnostic left caudal ESI #1 (contrast allergy protocol ordered)    Under consideration:   Diagnostic left caudal ESI #1  Diagnostic bilateral lumbar facet MBB #2  Diagnostic/therapeutic right IA hip joint injection #1    Completed:   Diagnostic bilateral lumbar facet MBB x1 (09/10/2023) (100/100/80 x 1 month)    Therapeutic  Palliative (PRN) options:   None established   Completed by other providers:   Therapeutic/diagnostic right L5 TFESI x2 (04/11/2016, 05/02/2016) by Morene Falcon, DO [facility])  Therapeutic/diagnostic right S1 TFESI x2 (04/11/2016, 05/02/2016) by Morene Falcon, DO [facility])  Therapeutic/diagnostic right L3 TFESI x1 (07/19/2016) by Morene Falcon, DO ([facility])      No follow-ups on file.    Recent Visits Date Type Provider Dept  05/05/24 Procedure visit Tanya Glisson, MD [facility]  05/04/24 Office Visit Tanya Glisson, MD [facility]  Showing recent visits  within past 90 days and meeting all other requirements Future Appointments Date Type Provider Dept  06/08/24 Appointment Tanya Glisson, MD [facility]  Showing future appointments within next 90 days and meeting all other requirements  I discussed the assessment and treatment plan with the patient. The patient was provided an opportunity to ask questions and all were answered. The patient agreed with the plan and demonstrated an understanding of the instructions.  Patient advised to call back or seek an in-person evaluation if the symptoms or condition worsens.  Duration of encounter: *** minutes.  Total time on encounter, as per AMA guidelines included both the face-to-face and non-face-to-face time personally spent by the physician and/or other qualified health care professional(s) on the day of the encounter (includes time in activities that require the physician or other qualified health care professional and does not include time in activities normally performed by clinical staff). Physician's time may include the following activities when performed: Preparing to see the patient (e.g., pre-charting review of records, searching for previously ordered imaging, lab work, and nerve conduction tests) Review of prior analgesic pharmacotherapies. Reviewing PMP Interpreting ordered tests (e.g., lab work, imaging, nerve conduction tests) Performing post-procedure evaluations, including interpretation of diagnostic procedures Obtaining and/or reviewing separately obtained history Performing a medically appropriate examination and/or evaluation Counseling and educating the patient/family/caregiver Ordering medications, tests, or procedures Referring and communicating with other health care professionals (when not separately reported) Documenting clinical information in the electronic or other health record Independently interpreting results (not separately reported) and communicating  results to the patient/ family/caregiver Care coordination (not separately reported)  Note by: Glisson DELENA Tanya, MD (TTS and AI technology used. I apologize for any typographical errors that were not detected and corrected.) Date: 06/08/2024; Time: 11:34 AM

## 2024-06-08 ENCOUNTER — Encounter: Payer: Self-pay | Admitting: Pain Medicine

## 2024-06-08 ENCOUNTER — Ambulatory Visit: Attending: Pain Medicine | Admitting: Pain Medicine

## 2024-06-08 VITALS — BP 152/61 | HR 49 | Temp 97.1°F | Ht 63.0 in | Wt 139.0 lb

## 2024-06-08 DIAGNOSIS — M47816 Spondylosis without myelopathy or radiculopathy, lumbar region: Secondary | ICD-10-CM | POA: Diagnosis not present

## 2024-06-08 DIAGNOSIS — M4316 Spondylolisthesis, lumbar region: Secondary | ICD-10-CM | POA: Insufficient documentation

## 2024-06-08 DIAGNOSIS — M47817 Spondylosis without myelopathy or radiculopathy, lumbosacral region: Secondary | ICD-10-CM | POA: Insufficient documentation

## 2024-06-08 DIAGNOSIS — M5410 Radiculopathy, site unspecified: Secondary | ICD-10-CM | POA: Diagnosis not present

## 2024-06-08 DIAGNOSIS — M545 Low back pain, unspecified: Secondary | ICD-10-CM | POA: Insufficient documentation

## 2024-06-08 DIAGNOSIS — M5459 Other low back pain: Secondary | ICD-10-CM | POA: Diagnosis not present

## 2024-06-08 DIAGNOSIS — Z09 Encounter for follow-up examination after completed treatment for conditions other than malignant neoplasm: Secondary | ICD-10-CM | POA: Diagnosis not present

## 2024-06-08 DIAGNOSIS — M5417 Radiculopathy, lumbosacral region: Secondary | ICD-10-CM | POA: Insufficient documentation

## 2024-06-08 DIAGNOSIS — G8929 Other chronic pain: Secondary | ICD-10-CM | POA: Insufficient documentation

## 2024-06-08 DIAGNOSIS — M79604 Pain in right leg: Secondary | ICD-10-CM | POA: Insufficient documentation

## 2024-06-08 NOTE — Progress Notes (Signed)
 Safety precautions to be maintained throughout the outpatient stay will include: orient to surroundings, keep bed in low position, maintain call bell within reach at all times, provide assistance with transfer out of bed and ambulation.

## 2024-06-08 NOTE — Patient Instructions (Signed)

## 2024-06-11 ENCOUNTER — Ambulatory Visit: Admitting: Family Medicine

## 2024-06-16 ENCOUNTER — Ambulatory Visit
Admission: RE | Admit: 2024-06-16 | Discharge: 2024-06-16 | Disposition: A | Discharge status: Home / Self Care | Source: Ambulatory Visit | Attending: Pain Medicine | Admitting: Pain Medicine

## 2024-06-16 ENCOUNTER — Encounter: Payer: Self-pay | Admitting: Pain Medicine

## 2024-06-16 ENCOUNTER — Ambulatory Visit (HOSPITAL_BASED_OUTPATIENT_CLINIC_OR_DEPARTMENT_OTHER): Admitting: Pain Medicine

## 2024-06-16 VITALS — BP 171/80 | HR 52 | Temp 97.3°F | Resp 13 | Ht 63.0 in | Wt 138.0 lb

## 2024-06-16 DIAGNOSIS — G8929 Other chronic pain: Secondary | ICD-10-CM

## 2024-06-16 DIAGNOSIS — M47816 Spondylosis without myelopathy or radiculopathy, lumbar region: Secondary | ICD-10-CM

## 2024-06-16 DIAGNOSIS — M545 Low back pain, unspecified: Secondary | ICD-10-CM | POA: Diagnosis not present

## 2024-06-16 DIAGNOSIS — M47817 Spondylosis without myelopathy or radiculopathy, lumbosacral region: Secondary | ICD-10-CM | POA: Insufficient documentation

## 2024-06-16 DIAGNOSIS — M5459 Other low back pain: Secondary | ICD-10-CM

## 2024-06-16 DIAGNOSIS — M4316 Spondylolisthesis, lumbar region: Secondary | ICD-10-CM | POA: Diagnosis not present

## 2024-06-16 DIAGNOSIS — M79604 Pain in right leg: Secondary | ICD-10-CM | POA: Diagnosis not present

## 2024-06-16 MED ORDER — FENTANYL CITRATE (PF) 100 MCG/2ML IJ SOLN
25.0000 ug | INTRAMUSCULAR | Status: DC | PRN
  Administered 2024-06-16: 50 ug via INTRAVENOUS

## 2024-06-16 MED ORDER — ROPIVACAINE HCL 2 MG/ML IJ SOLN
INTRAMUSCULAR | Status: AC
  Filled 2024-06-16: qty 20

## 2024-06-16 MED ORDER — TRIAMCINOLONE ACETONIDE 40 MG/ML IJ SUSP
80.0000 mg | Freq: Once | INTRAMUSCULAR | Status: AC
  Administered 2024-06-16: 80 mg

## 2024-06-16 MED ORDER — ROPIVACAINE HCL 2 MG/ML IJ SOLN
18.0000 mL | Freq: Once | INTRAMUSCULAR | Status: AC
  Administered 2024-06-16: 18 mL via PERINEURAL

## 2024-06-16 MED ORDER — TRIAMCINOLONE ACETONIDE 40 MG/ML IJ SUSP
INTRAMUSCULAR | Status: AC
  Filled 2024-06-16: qty 2

## 2024-06-16 MED ORDER — LIDOCAINE HCL 2 % IJ SOLN
20.0000 mL | Freq: Once | INTRAMUSCULAR | Status: AC
  Administered 2024-06-16: 400 mg

## 2024-06-16 MED ORDER — PENTAFLUOROPROP-TETRAFLUOROETH EX AERO
INHALATION_SPRAY | Freq: Once | CUTANEOUS | Status: AC
  Administered 2024-06-16: 30 via TOPICAL

## 2024-06-16 MED ORDER — FENTANYL CITRATE (PF) 100 MCG/2ML IJ SOLN
INTRAMUSCULAR | Status: AC
  Filled 2024-06-16: qty 2

## 2024-06-16 MED ORDER — MIDAZOLAM HCL 5 MG/5ML IJ SOLN
INTRAMUSCULAR | Status: AC
  Filled 2024-06-16: qty 5

## 2024-06-16 MED ORDER — MIDAZOLAM HCL 5 MG/5ML IJ SOLN
0.5000 mg | Freq: Once | INTRAMUSCULAR | Status: AC
  Administered 2024-06-16: 2 mg via INTRAVENOUS

## 2024-06-16 MED ORDER — LIDOCAINE HCL 2 % IJ SOLN
INTRAMUSCULAR | Status: AC
  Filled 2024-06-16: qty 20

## 2024-06-16 NOTE — Progress Notes (Signed)
 Safety precautions to be maintained throughout the outpatient stay will include: orient to surroundings, keep bed in low position, maintain call bell within reach at all times, provide assistance with transfer out of bed and ambulation.

## 2024-06-16 NOTE — Progress Notes (Signed)
 PROVIDER NOTE: Interpretation of information contained herein should be left to medically-trained personnel. Specific patient instructions are provided elsewhere under Patient Instructions section of medical record. This document was created in part using STT-dictation technology, any transcriptional errors that may result from this process are unintentional.  Patient: Julie Acevedo Type: Established DOB: Oct 06, 1951 MRN: [id number] PCP: Sharma Coyer, MD  Service: Procedure DOS: 06/16/2024 Setting: Ambulatory Location: Ambulatory outpatient facility Delivery: Face-to-face Provider: Eric DELENA Como, MD Specialty: Interventional Pain Management Specialty designation: 09 Location: Outpatient facility Ref. Prov.: Como Eric, MD       Interventional Therapy   Type: Lumbar Facet, Medial Branch Block(s) (w/ fluoroscopic mapping) #2  Laterality: Bilateral  Level: L2, L3, L4, L5, and S1 Medial Branch/Dorsal Rami Level(s). Injecting these levels blocks the L3-4, L4-5, and L5-S1 lumbar facet joints. Imaging: Fluoroscopic guidance Spinal ([id number]) Anesthesia: Local anesthesia (1-2% Lidocaine ) Anxiolysis: IV Versed  2.0 mg Sedation: Moderate Sedation Fentanyl  1 mL (50 mcg) DOS: 06/16/2024 Performed by: Eric DELENA Como, MD  Primary Purpose: Diagnostic/Therapeutic Indications: Low back pain severe enough to impact quality of life or function. 1. Chronic low back pain (1ry area of Pain) (Bilateral) (R>L) w/o sciatica   2. Lumbar facet joint pain   3. Lumbar facet hypertrophy (Multilevel) (Bilateral)   4. Lumbar facet joint syndrome   5. Lumbar facet arthropathy   6. 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 Chronic low back pain of over 3 months duration   8. Spondylosis without myelopathy or radiculopathy, lumbosacral region    NAS-11 Pain score:   Pre-procedure: 8 /10   Post-procedure: 8 /10     Position / Prep / Materials:  Position: Prone   Prep solution: ChloraPrep (2% chlorhexidine  gluconate and 70% isopropyl alcohol) Area Prepped: Posterolateral Lumbosacral Spine (Wide prep: From the lower border of the scapula down to the end of the tailbone and from flank to flank.)  Materials:  Tray: Block Needle(s):  Type: Spinal  Gauge (G): 22  Length: 5-in Qty: 4     H&P (Pre-op Assessment):  Julie Acevedo is a 73 y.o. (year old), female patient, seen today for interventional treatment. She  has a past surgical history that includes Hemicolectomy; Lumbar disc surgery; Roux-en-Y Gastric Bypass (2010); Cholecystectomy; Eye surgery; Hand surgery; Foot surgery; Abdominal hysterectomy; Tonsillectomy; Upper gi endoscopy (01/12/2016); Cardiac catheterization (2000); Laparoscopic lysis of adhesions (N/A, 08/21/2016); Ventral hernia repair (N/A, 08/21/2016); Insertion of mesh (N/A, 08/21/2016); Cystocele repair (N/A, 03/19/2017); Cystoscopy (N/A, 03/19/2017); Laparoscopic removal abdominal mass; LOOP RECORDER INSERTION (N/A, 03/21/2018); laparotomy (N/A, 04/16/2019); XI robotic assisted ventral hernia (N/A, 12/08/2019); Insertion of mesh (N/A, 12/08/2019); Breast excisional biopsy (Right, 1971); Small intestine surgery; and Esophagogastroduodenoscopy (egd) with propofol  (N/A, 12/15/2021). Julie Acevedo has a current medication list which includes the following prescription(s): accu-chek guide, accu-chek softclix lancets, atorvastatin , blood glucose meter kit and supplies, calcitriol , diazoxide , diphenhydramine , diphenhydramine , gabapentin , hydroxyzine , ibuprofen, levetiracetam , linaclotide , meclizine , metoprolol  succinate, mirtazapine , omeprazole , ondansetron , prednisone , rivastigmine , rosuvastatin , trazodone , and triamcinolone  cream, and the following Facility-Administered Medications: fentanyl . Her primarily concern today is the Back Pain (Left lower)  Initial Vital Signs:  Pulse/HCG Rate: (!) 54ECG Heart Rate: (!) 54 Temp:   Resp: 16 BP: (!)  155/71 SpO2: 100 %  BMI: Estimated body mass index is 24.45 kg/m as calculated from the following:   Height as of this encounter: 5' 3 (1.6 m).   Weight as of this encounter: 138 lb (62.6 kg).  Risk Assessment: Allergies: Reviewed. She is allergic to lotrel [amlodipine   besy-benazepril hcl], contrast media [iodinated contrast media], niacin, and sulfa antibiotics.  Allergy Precautions: None required Coagulopathies: Reviewed. None identified.  Blood-thinner therapy: None at this time Active Infection(s): Reviewed. None identified. Julie Acevedo is afebrile  Site Confirmation: Ms. Stevison was asked to confirm the procedure and laterality before marking the site Procedure checklist: Completed Consent: Before the procedure and under the influence of no sedative(s), amnesic(s), or anxiolytics, the patient was informed of the treatment options, risks and possible complications. To fulfill our ethical and legal obligations, as recommended by the American Medical Association's Code of Ethics, I have informed the patient of my clinical impression; the nature and purpose of the treatment or procedure; the risks, benefits, and possible complications of the intervention; the alternatives, including doing nothing; the risk(s) and benefit(s) of the alternative treatment(s) or procedure(s); and the risk(s) and benefit(s) of doing nothing. The patient was provided information about the general risks and possible complications associated with the procedure. These may include, but are not limited to: failure to achieve desired goals, infection, bleeding, organ or nerve damage, allergic reactions, paralysis, and death. In addition, the patient was informed of those risks and complications associated to Spine-related procedures, such as failure to decrease pain; infection (i.e.: Meningitis, epidural or intraspinal abscess); bleeding (i.e.: epidural hematoma, subarachnoid hemorrhage, or any other type of intraspinal or  peri-dural bleeding); organ or nerve damage (i.e.: Any type of peripheral nerve, nerve root, or spinal cord injury) with subsequent damage to sensory, motor, and/or autonomic systems, resulting in permanent pain, numbness, and/or weakness of one or several areas of the body; allergic reactions; (i.e.: anaphylactic reaction); and/or death. Furthermore, the patient was informed of those risks and complications associated with the medications. These include, but are not limited to: allergic reactions (i.e.: anaphylactic or anaphylactoid reaction(s)); adrenal axis suppression; blood sugar elevation that in diabetics may result in ketoacidosis or comma; water  retention that in patients with history of congestive heart failure may result in shortness of breath, pulmonary edema, and decompensation with resultant heart failure; weight gain; swelling or edema; medication-induced neural toxicity; particulate matter embolism and blood vessel occlusion with resultant organ, and/or nervous system infarction; and/or aseptic necrosis of one or more joints. Finally, the patient was informed that Medicine is not an exact science; therefore, there is also the possibility of unforeseen or unpredictable risks and/or possible complications that may result in a catastrophic outcome. The patient indicated having understood very clearly. We have given the patient no guarantees and we have made no promises. Enough time was given to the patient to ask questions, all of which were answered to the patient's satisfaction. Ms. Freeze has indicated that she wanted to continue with the procedure. Attestation: I, the ordering provider, attest that I have discussed with the patient the benefits, risks, side-effects, alternatives, likelihood of achieving goals, and potential problems during recovery for the procedure that I have provided informed consent. Date  Time: 06/16/2024  9:16 AM  Pre-Procedure Preparation:  Monitoring: As per clinic  protocol. Respiration, ETCO2, SpO2, BP, heart rate and rhythm monitor placed and checked for adequate function Safety Precautions: Patient was assessed for positional comfort and pressure points before starting the procedure. Time-out: I initiated and conducted the Time-out before starting the procedure, as per protocol. The patient was asked to participate by confirming the accuracy of the Time Out information. Verification of the correct person, site, and procedure were performed and confirmed by me, the nursing staff, and the patient. Time-out conducted as per Joint Commission's  Universal Protocol (UP.01.01.01). Time: 1030 Start Time: 1030 hrs.  Description of Procedure:          Laterality: (see above) Targeted Levels: (see above)  Safety Precautions: Aspiration looking for blood return was conducted prior to all injections. At no point did we inject any substances, as a needle was being advanced. Before injecting, the patient was told to immediately notify me if she was experiencing any new onset of ringing in the ears, or metallic taste in the mouth. No attempts were made at seeking any paresthesias. Safe injection practices and needle disposal techniques used. Medications properly checked for expiration dates. SDV (single dose vial) medications used. After the completion of the procedure, all disposable equipment used was discarded in the proper designated medical waste containers. Local Anesthesia: Protocol guidelines were followed. The patient was positioned over the fluoroscopy table. The area was prepped in the usual manner. The time-out was completed. The target area was identified using fluoroscopy. A 12-in long, straight, sterile hemostat was used with fluoroscopic guidance to locate the targets for each level blocked. Once located, the skin was marked with an approved surgical skin marker. Once all sites were marked, the skin (epidermis, dermis, and hypodermis), as well as deeper  tissues (fat, connective tissue and muscle) were infiltrated with a small amount of a short-acting local anesthetic, loaded on a 10cc syringe with a 25G, 1.5-in  Needle. An appropriate amount of time was allowed for local anesthetics to take effect before proceeding to the next step. Local Anesthetic: Lidocaine  2.0% The unused portion of the local anesthetic was discarded in the proper designated containers. Technical description of process:  Medial Branch  Dorsal Rami Nerve Block (MBB):  Neuroanatomy note: Each lumbar facet joint receives dual innervation from medial branches arising from the posterior primary rami at the same level and one level above. The target for each lumbar medial branch is the junction of the ipsilateral superior articular and transverse process of the lower vertebral body. (i.e.: The L4-L5 facet joint is innervated by the L4 medial branch [located at L5] and the L3 medial branch [located at L4]. Blocking the L4 Medial Branch is therefore achieved by injecting at the junction of the ipsilateral superior articular and transverse process of the lower vertebral body [L5].).  Exception: The exception to the above rule is the L5-S1 facet joint which has triple innervation requiring the L4 medial branch, as well as the L5 and the S1 Dorsal Rami(s) to be blocked to fully denervate the joint.  Under fluoroscopic guidance, a needle was inserted until contact was made with os over the target area. After negative aspiration, 0.5 mL of the nerve block solution was injected without difficulty or complication. Paresthesia were avoided during injection. The needle(s) were removed intact and without complication.  Once the entire procedure was completed, the treated area was cleaned, making sure to leave some of the prepping solution back to take advantage of its long term bactericidal properties.         Illustration of the posterior view of the lumbar spine and the posterior neural  structures. Laminae of L2 through S1 are labeled. DPRL5, dorsal primary ramus of L5; DPRS1, dorsal primary ramus of S1; DPR3, dorsal primary ramus of L3; FJ, facet (zygapophyseal) joint L3-L4; I, inferior articular process of L4; LB1, lateral branch of dorsal primary ramus of L1; IAB, inferior articular branches from L3 medial branch (supplies L4-L5 facet joint); IBP, intermediate branch plexus; MB3, medial branch of dorsal primary ramus of  L3; NR3, third lumbar nerve root; S, superior articular process of L5; SAB, superior articular branches from L4 (supplies L4-5 facet joint also); TP3, transverse process of L3.   Facet Joint Innervation (* possible contribution)  L1-2 T12, L1 (L2*)  Medial Branch  L2-3 L1, L2 (L3*)                     L3-4 L2, L3 (L4*)                     L4-5 L3, L4 (L5*)                     L5-S1 L4, L5, S1                        Vitals:   06/16/24 1030 06/16/24 1035 06/16/24 1040 06/16/24 1043  BP: (!) 188/94 (!) 179/90 (!) 150/88 (!) 149/82  Pulse: (!) 52     Resp: 16 16  16   SpO2: 100% 100% 100% 100%  Weight:      Height:         End Time: 1043 hrs.  Imaging Guidance (Spinal):         Type of Imaging Technique: Fluoroscopy Guidance (Spinal) Indication(s): Fluoroscopy guidance for needle placement to enhance accuracy in procedures requiring precise needle localization for targeted delivery of medication in or near specific anatomical locations not easily accessible without such real-time imaging assistance. Exposure Time: Please see nurses notes. Contrast: None used. Fluoroscopic Guidance: I was personally present during the use of fluoroscopy. Tunnel Vision Technique used to obtain the best possible view of the target area. Parallax error corrected before commencing the procedure. Direction-depth-direction technique used to introduce the needle under continuous pulsed fluoroscopy. Once target was reached, antero-posterior, oblique, and lateral  fluoroscopic projection used confirm needle placement in all planes. Images permanently stored in EMR. Interpretation: No contrast injected. I personally interpreted the imaging intraoperatively. Adequate needle placement confirmed in multiple planes. Permanent images saved into the patient's record.  Post-operative Assessment:  Post-procedure Vital Signs:  Pulse/HCG Rate: (!) 52(!) 52 Temp:   Resp: 16 BP: (!) 149/82 SpO2: 100 %  EBL: None  Complications: No immediate post-treatment complications observed by team, or reported by patient.  Note: The patient tolerated the entire procedure well. A repeat set of vitals were taken after the procedure and the patient was kept under observation following institutional policy, for this type of procedure. Post-procedural neurological assessment was performed, showing return to baseline, prior to discharge. The patient was provided with post-procedure discharge instructions, including a section on how to identify potential problems. Should any problems arise concerning this procedure, the patient was given instructions to immediately contact us , at any time, without hesitation. In any case, we plan to contact the patient by telephone for a follow-up status report regarding this interventional procedure.  Comments:  No additional relevant information.  Plan of Care (POC)  Orders:  Orders Placed This Encounter  Procedures   LUMBAR FACET(MEDIAL BRANCH NERVE BLOCK) MBNB    Scheduling Instructions:     Procedure: Lumbar facet block (AKA.: Lumbosacral medial branch nerve block)     Side: Bilateral     Level: L3-4, L4-5, L5-S1, and TBD Facets (L2, L3, L4, L5, S1, and TBD Medial Branch Nerves)     Sedation: Patient's choice.     Date: 06/16/2024    Where will this procedure be performed?:   [facility]  Pain Management   DG PAIN CLINIC C-ARM 1-60 MIN NO REPORT    Intraoperative interpretation by procedural physician at [facility].    Standing  Status:   Standing    Number of Occurrences:   1    Reason for exam::   Assistance in needle guidance and placement for procedures requiring needle placement in or near specific anatomical locations not easily accessible without such assistance.   Informed Consent Details: Physician/Practitioner Attestation; Transcribe to consent form and obtain patient signature    Nursing Order: Transcribe to consent form and obtain patient signature. Note: Always confirm laterality of pain with Ms. Ann, before procedure.    Physician/Practitioner attestation of informed consent for procedure/surgical case:   I, the physician/practitioner, attest that I have discussed with the patient the benefits, risks, side effects, alternatives, likelihood of achieving goals and potential problems during recovery for the procedure that I have provided informed consent.    Procedure:   Lumbar Facet Block  under fluoroscopic guidance    Physician/Practitioner performing the procedure:   Daton Szilagyi A. Tanya MD    Indication/Reason:   Low Back Pain, with our without leg pain, due to Facet Joint Arthralgia (Joint Pain) Spondylosis (Arthritis of the Spine), without myelopathy or radiculopathy (Nerve Damage).   Provide equipment / supplies at bedside    Procedure tray: Block Tray (Disposable  single use) Skin infiltration needle: Regular 1.5-in, 25-G, (x1) Block Needle type: Spinal Amount/quantity: 4 Size: Medium (5-inch) Gauge: 22G    Standing Status:   Standing    Number of Occurrences:   1    Specify:   Block Tray   Saline lock IV    Have LR [phone] mL available and administer at 125 mL/hr if patient becomes hypotensive.    Standing Status:   Standing    Number of Occurrences:   1    Opioid Analgesic(s):  Analgesic: No chronic opioid analgesics therapy prescribed by our practice. None MME/day: 0 mg/day    Medications ordered for procedure: Meds ordered this encounter  Medications   lidocaine  (XYLOCAINE ) 2 %  (with pres) injection 400 mg   pentafluoroprop-tetrafluoroeth (GEBAUERS) aerosol   midazolam  (VERSED ) 5 MG/5ML injection 0.5-2 mg    Make sure Flumazenil is available in the pyxis when using this medication. If oversedation occurs, administer 0.2 mg IV over 15 sec. If after 45 sec no response, administer 0.2 mg again over 1 min; may repeat at 1 min intervals; not to exceed 4 doses (1 mg)   fentaNYL  (SUBLIMAZE ) injection 25-50 mcg    Make sure Narcan is available in the pyxis when using this medication. In the event of respiratory depression (RR< 8/min): Titrate NARCAN (naloxone) in increments of 0.1 to 0.2 mg IV at 2-3 minute intervals, until desired degree of reversal.   ropivacaine  (PF) 2 mg/mL (0.2%) (NAROPIN ) injection 18 mL   triamcinolone  acetonide (KENALOG -40) injection 80 mg   Medications administered: We administered lidocaine , pentafluoroprop-tetrafluoroeth, midazolam , fentaNYL , ropivacaine  (PF) 2 mg/mL (0.2%), and triamcinolone  acetonide.  See the medical record for exact dosing, route, and time of administration.    Interventional Therapies  Risk Factors  Considerations:  Cardiologist: Cara Lovelace, MD ([facility])  Neurologist: Jannett Fairly, MD [facility] neurolog[facility]) Hx of MI (1995)  Hx of TIAs  T2DM  Stage 3b CKD  [name]  Memory impairment  Hx of Bowel Obstruction (04/2019)  GERD  s/p Gastric Bypass for obesity  Malnutrition-malabsorption  Hx. Seizures      Planned  Pending:   Diagnostic bilateral lumbar facet MBB #2 with possible radiofrequency ablation follow-up.   Under consideration:   Diagnostic left caudal ESI #2  Diagnostic bilateral lumbar facet MBB #2  Diagnostic/therapeutic bilateral IA hip joint injection #1  Diagnostic/therapeutic bilateral SI joint block #1    Completed:   Diagnostic bilateral lumbar facet MBB x1 (09/10/2023) (100/100/80 x 1 month)  Diagnostic left caudal ESI x2 (contrast allergy protocol ordered) (05/05/2024)  (100/100/50/LE:100%LBP:50%)   Therapeutic  Palliative (PRN) options:   None established   Completed by other providers:   Therapeutic/diagnostic right L5 TFESI x2 (04/11/2016, 05/02/2016) by Morene Falcon, DO [facility])  Therapeutic/diagnostic right S1 TFESI x2 (04/11/2016, 05/02/2016) by Morene Falcon, DO [facility])  Therapeutic/diagnostic right L3 TFESI x1 (07/19/2016) by Morene Falcon, DO [facility])       Follow-up plan:   Return in about 2 weeks (around 06/30/2024) for (Face2F), (PPE).     Recent Visits Date Type Provider Dept  06/08/24 Office Visit Tanya Glisson, MD [facility]  05/05/24 Procedure visit Tanya Glisson, MD [facility]  05/04/24 Office Visit Tanya Glisson, MD [facility]  Showing recent visits within past 90 days and meeting all other requirements Today's Visits Date Type Provider Dept  06/16/24 Procedure visit Tanya Glisson, MD [facility]  Showing today's visits and meeting all other requirements Future Appointments Date Type Provider Dept  07/02/24 Appointment Tanya Glisson, MD [facility]  Showing future appointments within next 90 days and meeting all other requirements   Disposition: Discharge home  Discharge (Date  Time): 06/16/2024;   hrs.   Primary Care Physician: Sharma Coyer, MD Location: [facility] Note by: Glisson DELENA Tanya, MD (TTS technology used. I apologize for any typographical errors that were not detected and corrected.) Date: 06/16/2024; Time: 10:47 AM  Disclaimer:  Medicine is not an Visual merchandiser. The only guarantee in medicine is that nothing is guaranteed. It is important to note that the decision to proceed with this intervention was based on the information collected from the patient. The Data and conclusions were drawn from the patient's questionnaire, the interview, and the physical examination. Because the  information was provided in large part by the patient, it cannot be guaranteed that it has not been purposely or unconsciously manipulated. Every effort has been made to obtain as much relevant data as possible for this evaluation. It is important to note that the conclusions that lead to this procedure are derived in large part from the available data. Always take into account that the treatment will also be dependent on availability of resources and existing treatment guidelines, considered by other Pain Management Practitioners as being common knowledge and practice, at the time of the intervention. For Medico-Legal purposes, it is also important to point out that variation in procedural techniques and pharmacological choices are the acceptable norm. The indications, contraindications, technique, and results of the above procedure should only be interpreted and judged by a Board-Certified Interventional Pain Specialist with extensive familiarity and expertise in the same exact procedure and technique.

## 2024-06-16 NOTE — Patient Instructions (Signed)

## 2024-06-17 ENCOUNTER — Telehealth: Payer: Self-pay

## 2024-06-17 NOTE — Telephone Encounter (Signed)
 No issues post-procedure.

## 2024-06-26 ENCOUNTER — Ambulatory Visit: Admitting: Family Medicine

## 2024-06-26 ENCOUNTER — Other Ambulatory Visit: Payer: Self-pay | Admitting: Family Medicine

## 2024-06-26 ENCOUNTER — Encounter: Payer: Self-pay | Admitting: Family Medicine

## 2024-06-26 VITALS — BP 173/75 | HR 52 | Ht 63.0 in | Wt 137.0 lb

## 2024-06-26 DIAGNOSIS — I10 Essential (primary) hypertension: Secondary | ICD-10-CM

## 2024-06-26 DIAGNOSIS — R251 Tremor, unspecified: Secondary | ICD-10-CM | POA: Diagnosis not present

## 2024-06-26 DIAGNOSIS — F02A0 Dementia in other diseases classified elsewhere, mild, without behavioral disturbance, psychotic disturbance, mood disturbance, and anxiety: Secondary | ICD-10-CM

## 2024-06-26 DIAGNOSIS — Z9989 Dependence on other enabling machines and devices: Secondary | ICD-10-CM | POA: Diagnosis not present

## 2024-06-26 DIAGNOSIS — G40201 Localization-related (focal) (partial) symptomatic epilepsy and epileptic syndromes with complex partial seizures, not intractable, with status epilepticus: Secondary | ICD-10-CM | POA: Diagnosis not present

## 2024-06-26 DIAGNOSIS — E1122 Type 2 diabetes mellitus with diabetic chronic kidney disease: Secondary | ICD-10-CM | POA: Diagnosis not present

## 2024-06-26 DIAGNOSIS — N1832 Chronic kidney disease, stage 3b: Secondary | ICD-10-CM | POA: Diagnosis not present

## 2024-06-26 DIAGNOSIS — G3183 Dementia with Lewy bodies: Secondary | ICD-10-CM | POA: Diagnosis not present

## 2024-06-26 MED ORDER — HYDROCHLOROTHIAZIDE 25 MG PO TABS: 12.5000 mg | ORAL_TABLET | Freq: Every day | ORAL | 1 refills | Status: DC

## 2024-06-26 NOTE — Progress Notes (Addendum)
 Established patient visit   Patient: Julie Acevedo   DOB: [birth year]   74 y.o. Female  MRN: [id number] Visit Date: 06/26/2024  Today's healthcare provider: Rockie Agent, MD   Chief Complaint  Patient presents with   Dizziness    She is still feeling dizzy more so when she stand up    Subjective     HPI     Dizziness    Additional comments: She is still feeling dizzy more so when she stand up       Last edited by Thelbert Eulalio CHRISTELLA, CMA on 06/26/2024  1:27 PM.       Discussed the use of AI scribe software for clinical note transcription with the patient, who gave verbal consent to proceed.  History of Present Illness Julie Acevedo is a 73 year old female with hypertension who presents with persistent dizziness.  She experiences persistent dizziness, particularly when transitioning from a seated to a standing position, which feels like she is going to fall back. This sensation occurs regardless of the time of day and requires her to stand still momentarily to regain balance. The dizziness has been worsening slightly over time.  She has a history of elevated blood pressure, with a recent reading of 173/75. She is currently on metoprolol  25 mg once daily. Hydrochlorothiazide  was previously discontinued due to low blood pressure. She did not take her blood pressure at home this morning but notes recent elevated readings. She experiences headaches, particularly on the top of her head, which she associates with her blood pressure.  She has a history of [name]. She describes episodes of forgetting tasks she is in the middle of and needing to sit down to remember what she was doing. Her husband advises her to take her time, but she finds it frustrating.  She reports a tremor, which developed after her dementia diagnosis. She is unsure if it is related to her dementia but notes it has been present for a while.  Her past medical history includes a transient  ischemic attack (TIA) and a history of seizures. She underwent brain imaging in September 2024, which showed ischemic changes in the cerebral white matter but no stroke or bleeding.  Socially, she lives with her husband and grandson, who provide support. She enjoys dancing but finds her balance issues limit her ability to perform as she used to.     Past Medical History:  Diagnosis Date   Anemia    Arthritis    COVID-19 11/19/2019   Diverticulitis    DM (diabetes mellitus) (HCC)    typ e 2   History of methicillin resistant staphylococcus aureus (MRSA) 2017   HTN (hypertension)    Hyperlipidemia    L-S radiculopathy 06/11/2015   Left leg swelling 01/22/2023   Lower abdominal pain 08/08/2022   Memory difficulties 09/01/2015   Multiple thyroid  nodules    Myocardial infarction (HCC) 1995   Partial small bowel obstruction (HCC) 04/23/2016   SBO (small bowel obstruction) (HCC)    Short-term memory loss    Thyroid  nodule     Medications: Outpatient Medications Prior to Visit  Medication Sig   ACCU-CHEK GUIDE test strip USE TO CHECK BLOOD SUGAR UP TO 4 TIMES A DAY AS DIRECTED   Accu-Chek Softclix Lancets lancets SMARTSIG:Topical 1-4 Times Daily   atorvastatin  (LIPITOR) 40 MG tablet Take 1 tablet by mouth every morning.   blood glucose meter kit and supplies Dispense Accu-Chek Guide. Use up  to four times daily as directed. 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   calcitRIOL  (ROCALTROL ) 0.25 MCG capsule Take 0.25 mcg by mouth daily.   diazoxide  (PROGLYCEM ) 50 MG/ML suspension take 1ml (50mg  total) BY MOUTH EVERY 8 HOURS   diphenhydrAMINE  (BENADRYL ) 50 MG capsule Take 50 mg by mouth 1 hour before procedure. Must have a driver.   diphenhydrAMINE  (BENADRYL ) 50 MG capsule Take 50 mg by mouth 1 hour before procedure. Must have a driver.   gabapentin  (NEURONTIN ) 300 MG capsule TAKE 1 CAPSULE BY MOUTH 3 TIMES DAILY   hydrOXYzine  (VISTARIL ) 100 MG capsule TAKE 1 CAPSULE BY MOUTH 3 TIMES DAILY    ibuprofen (ADVIL) 600 MG tablet Take 600 mg by mouth every 6 (six) hours as needed.   levETIRAcetam  (KEPPRA ) 500 MG tablet Take 1 tablet (500 mg total) by mouth 2 (two) times daily.   linaclotide  (LINZESS ) 145 MCG CAPS capsule Take 1 capsule (145 mcg total) by mouth daily before breakfast.   metoprolol  succinate (TOPROL -XL) 25 MG 24 hr tablet TAKE 1 TABLET BY MOUTH EVERY MORNING   mirtazapine  (REMERON  SOL-TAB) 15 MG disintegrating tablet TAKE 1 TABLET BY MOUTH EVERYDAY AT BEDTIME   omeprazole  (PRILOSEC) 20 MG capsule TAKE 1 CAPSULE BY MOUTH ONCE DAILY   ondansetron  (ZOFRAN -ODT) 4 MG disintegrating tablet Take 1 tablet (4 mg total) by mouth every 8 (eight) hours as needed.   predniSONE  (DELTASONE ) 50 MG tablet Take 1 tab PO 13 hrs, 7 hrs, and 1 hr before contrast media injection (procedure).   rivastigmine  (EXELON ) 3 MG capsule Take 3 mg by mouth 2 (two) times daily.   rosuvastatin  (CRESTOR ) 40 MG tablet Take 1 tablet (40 mg total) by mouth daily.   traZODone  (DESYREL ) 50 MG tablet Take 1 tablet by mouth at bedtime.   triamcinolone  cream (KENALOG ) 0.5 % Apply 1 Application topically 3 (three) times daily as needed (flares).   [DISCONTINUED] meclizine  (ANTIVERT ) 12.5 MG tablet Take 1 tablet (12.5 mg total) by mouth 3 (three) times daily as needed for dizziness.   No facility-administered medications prior to visit.    Review of Systems  Last metabolic panel Lab Results  Component Value Date   GLUCOSE 91 06/03/2024   NA 145 (H) 06/03/2024   K 3.7 06/03/2024   CL 108 (H) 06/03/2024   CO2 22 06/03/2024   BUN 12 06/03/2024   CREATININE 1.46 (H) 06/03/2024   EGFR 38 (L) 06/03/2024   CALCIUM  8.7 06/03/2024   PHOS 3.4 11/20/2022   PROT 5.8 (L) 06/03/2024   ALBUMIN 3.8 06/03/2024   LABGLOB 2.0 06/03/2024   AGRATIO 1.5 12/05/2020   BILITOT 0.4 06/03/2024   ALKPHOS 174 (H) 06/03/2024   AST 27 06/03/2024   ALT 19 06/03/2024   ANIONGAP 8 06/19/2023     Lab Results  Component Value Date    HGBA1C 6.4 11/01/2023       Objective    BP (!) 173/75   Pulse (!) 52   Ht 5' 3 (1.6 m)   Wt 137 lb (62.1 kg)   SpO2 98%   BMI 24.27 kg/m  BP Readings from Last 3 Encounters:  07/02/24 (!) 145/78  06/26/24 (!) 173/75  06/16/24 (!) 171/80   Wt Readings from Last 3 Encounters:  07/02/24 132 lb (59.9 kg)  06/26/24 137 lb (62.1 kg)  06/16/24 138 lb (62.6 kg)      DG PAIN CLINIC C-ARM 1-60 MIN NO REPORT Result Date: 06/16/2024 Fluoro was used, but no Radiologist interpretation will be  provided. Please refer to NOTES tab for provider progress note.  MM 3D DIAGNOSTIC MAMMOGRAM BILATERAL BREAST Result Date: 05/25/2024 CLINICAL DATA:  Palpable painful areas in the LEFT breast at 12, 2 and 3 EXAM: DIGITAL DIAGNOSTIC BILATERAL MAMMOGRAM WITH TOMOSYNTHESIS AND CAD; ULTRASOUND LEFT BREAST LIMITED TECHNIQUE: Bilateral digital diagnostic mammography and breast tomosynthesis was performed. The images were evaluated with computer-aided detection. ; Targeted ultrasound examination of the left breast was performed. Best images possible per technologist communication. COMPARISON:  Previous exam(s). ACR Breast Density Category c: The breasts are heterogeneously dense, which may obscure small masses. FINDINGS: Spot compression tomosynthesis views were obtained over the palpable/painful area of concern in the LEFT breast. No suspicious mammographic finding is identified in this area. No suspicious mass, microcalcification, or other finding is identified in either breast. On physical exam, no suspicious mass is appreciated. Targeted LEFT breast ultrasound was performed in the palpable/painful areas of concern at the upper and upper outer outer breast. No suspicious solid or cystic mass is identified. Cardiac loop recorder is noted at the 12 o'clock position at the site of palpable concern. IMPRESSION: 1. No mammographic or sonographic evidence of malignancy at the sites of palpable/painful concern in the  LEFT breast. Cardiac loop recorder is at the site of palpable concern at 12 o'clock. Any further workup of the patient's symptoms should be based on the clinical assessment. Recommend routine annual screening mammogram in 1 year. 2. No mammographic evidence of malignancy bilaterally. 3. Breast pain is a common condition, which will often resolve on its own without intervention. It can be affected by hormonal changes, medication side effect, weight changes and fit of the bra. Pain may also be referred from other adjacent areas of the body. Breast pain may be improved by wearing adequate well-fitting support, over-the-counter topical and oral NSAID medication, low-fat diet, and ice/heat as needed. Studies have shown an improvement in cyclic pain with use of evening primrose oil, vitamin D  and vitamin E. Clinical follow-up recommended to discuss any further work-up recommendations and appropriate treatment. RECOMMENDATION: Screening mammogram in one year.(Code:[id number]-B-01Y) I have discussed the findings and recommendations with the patient. If applicable, a reminder letter will be sent to the patient regarding the next appointment. BI-RADS CATEGORY  1: Negative. Electronically Signed   By: Corean Salter M.D.   On: 05/25/2024 11:31   US  LIMITED ULTRASOUND INCLUDING AXILLA LEFT BREAST  Result Date: 05/25/2024 CLINICAL DATA:  Palpable painful areas in the LEFT breast at 12, 2 and 3 EXAM: DIGITAL DIAGNOSTIC BILATERAL MAMMOGRAM WITH TOMOSYNTHESIS AND CAD; ULTRASOUND LEFT BREAST LIMITED TECHNIQUE: Bilateral digital diagnostic mammography and breast tomosynthesis was performed. The images were evaluated with computer-aided detection. ; Targeted ultrasound examination of the left breast was performed. Best images possible per technologist communication. COMPARISON:  Previous exam(s). ACR Breast Density Category c: The breasts are heterogeneously dense, which may obscure small masses. FINDINGS: Spot compression tomosynthesis  views were obtained over the palpable/painful area of concern in the LEFT breast. No suspicious mammographic finding is identified in this area. No suspicious mass, microcalcification, or other finding is identified in either breast. On physical exam, no suspicious mass is appreciated. Targeted LEFT breast ultrasound was performed in the palpable/painful areas of concern at the upper and upper outer outer breast. No suspicious solid or cystic mass is identified. Cardiac loop recorder is noted at the 12 o'clock position at the site of palpable concern. IMPRESSION: 1. No mammographic or sonographic evidence of malignancy at the sites of palpable/painful concern in  the LEFT breast. Cardiac loop recorder is at the site of palpable concern at 12 o'clock. Any further workup of the patient's symptoms should be based on the clinical assessment. Recommend routine annual screening mammogram in 1 year. 2. No mammographic evidence of malignancy bilaterally. 3. Breast pain is a common condition, which will often resolve on its own without intervention. It can be affected by hormonal changes, medication side effect, weight changes and fit of the bra. Pain may also be referred from other adjacent areas of the body. Breast pain may be improved by wearing adequate well-fitting support, over-the-counter topical and oral NSAID medication, low-fat diet, and ice/heat as needed. Studies have shown an improvement in cyclic pain with use of evening primrose oil, vitamin D  and vitamin E. Clinical follow-up recommended to discuss any further work-up recommendations and appropriate treatment. RECOMMENDATION: Screening mammogram in one year.(Code:[id number]-[id number]) I have discussed the findings and recommendations with the patient. If applicable, a reminder letter will be sent to the patient regarding the next appointment. BI-RADS CATEGORY  1: Negative. Electronically Signed   By: Corean Salter M.D.   On: 05/25/2024 11:31   DG PAIN CLINIC C-ARM  1-60 MIN NO REPORT Result Date: 05/05/2024 Fluoro was used, but no Radiologist interpretation will be provided. Please refer to NOTES tab for provider progress note.    Physical Exam  Neuro: +Romberg, pt avoids looking downward while moving from seated to standing position, uses a cane to assist with ambulation  CARD: RRR  PULM: no wheezing, no crackles, normal WOB on RA  No results found for any visits on 06/26/24.  Assessment & Plan     Problem List Items Addressed This Visit       Cardiovascular and Mediastinum   Essential hypertension     Endocrine   Type 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, without long-term current use of insulin  (HCC)   Chronic  Well controlled  A1c ordered today       Relevant Orders   Hemoglobin A1c   Ambulatory referral to Home Health     Nervous and Auditory   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, psychotic disturbance, mood disturbance, or anxiety (HCC)   Relevant Orders   Ambulatory referral to Home Health   Complex partial status epilepticus (HCC)   Relevant Orders   Ambulatory referral to Home Health     Genitourinary   Stage 3b chronic kidney disease (HCC)   Chronic  Follow up with nephrology as scheduled       Other Visit Diagnoses       Tremor    -  Primary   Relevant Orders   Ambulatory referral to Home Health     Ambulates with cane       Relevant Orders   Ambulatory referral to Home Health        Assessment & Plan Chronic Intermittent Dizziness Persistent dizziness, particularly during postural changes, exacerbated by seizures, [name], TIA, and possibly hypertension. The dizziness poses a safety risk due to balance issues. - Refer to neurology for further evaluation - Arrange physical therapy for balance  Recommend rolling walker to assist with safe ambulation in the home  - Consider home health physical therapy for safety concerns  Hypertension Chronic   Blood pressure elevated at 173/75 and 180 systolic on repeat . Currently on metoprolol  25 mg daily. Previously discontinued hydrochlorothiazide  due to hypotension. Elevated blood pressure may contribute to dizziness and headaches. Cautious about adding medications due  to potential dizziness exacerbation. - Initiate hydrochlorothiazide  12.5 mg half tab - Recheck blood pressure in two weeks - Check A1c for changes since last year  [name] [name] contributing to memory issues and tremor. Reports difficulty with memory and cognitive tasks. - referral for physical &  occupational therapy during home health visits - f/u with neurology recommended   Tremor Tremor, more pronounced in the right hand, developed post-dementia diagnosis, possibly related to [name]. - Discuss tremor with neurologist during follow-up  General Health Maintenance - Check A1c levels for diabetes management  Follow-up Requires follow-up to monitor blood pressure and medication effectiveness. - Schedule follow-up in three weeks to reassess blood pressure and medication effectiveness     Return in about 3 weeks (around 07/17/2024) for HTN.         Rockie Agent, MD  [facility] [phone] (phone) [phone] (fax)  [facility]

## 2024-06-26 NOTE — Telephone Encounter (Signed)
 Please see the message below and correct the directions

## 2024-06-26 NOTE — Assessment & Plan Note (Signed)
 Chronic  Well controlled  A1c ordered today

## 2024-06-26 NOTE — Patient Instructions (Signed)
  VISIT SUMMARY: During your visit, we discussed your persistent dizziness, elevated blood pressure, memory issues related to [name], and tremor. We reviewed your current medications and made some adjustments to help manage your symptoms better.  YOUR PLAN: -CHRONIC INTERMITTENT DIZZINESS: Your dizziness, especially when standing up, may be related to your history of seizures, [name], TIA, and possibly your blood pressure. We will refer you to a neurologist for further evaluation and arrange physical therapy to help with your balance. Home health physical therapy may also be considered for your safety.  -HYPERTENSION: Your blood pressure is elevated, which can contribute to your dizziness and headaches. We will start you on a low dose of hydrochlorothiazide  (12.5 mg, half a tablet) and recheck your blood pressure in two weeks. We will also check your A1c levels to monitor for diabetes.  -[name]: [name] affects your memory and cognitive tasks. We will discuss memory issues and cognitive strategies with occupational therapy during your home health visits.  -TREMOR: You have a tremor, especially in your right hand, which may be related to your [name]. We will discuss this further with your neurologist during your follow-up visit.  -GENERAL HEALTH MAINTENANCE: We will check your A1c levels to monitor for diabetes management.  INSTRUCTIONS: Please follow up in three weeks to reassess your blood pressure and the effectiveness of your medication. Additionally, make sure to attend your neurology and physical therapy appointments as scheduled.                      Contains text generated by [employer].                                 Contains text generated by [employer].

## 2024-06-26 NOTE — Assessment & Plan Note (Signed)
 Chronic  Follow up with nephrology as scheduled

## 2024-07-01 NOTE — Progress Notes (Unsigned)
 PROVIDER NOTE: Interpretation of information contained herein should be left to medically-trained personnel. Specific patient instructions are provided elsewhere under Patient Instructions section of medical record. This document was created in part using AI and STT-dictation technology, any transcriptional errors that may result from this process are unintentional.  Patient: Julie Acevedo  Service: E/M   PCP: Sharma Coyer, MD  DOB: 08-31-1951  DOS: 07/02/2024  Provider: Eric DELENA Como, MD  MRN: [id number]  Delivery: Face-to-face  Specialty: Interventional Pain Management  Type: Established Patient  Setting: Ambulatory outpatient facility  Specialty designation: 09  Referring Prov.: Simmons-Robinson, Makie*  Location: Outpatient office facility       History of present illness (HPI) Julie Acevedo, a 73 y.o. year old female, is here today because of her Chronic bilateral low back pain without sciatica [M54.50, G89.29]. Ms. Pyon primary complain today is No chief complaint on file.  Pertinent problems: Ms. Likes has Arthritis; Narrowing of intervertebral disc space; Chronic low back pain of over 3 months duration; Lumbosacral radiculopathy at S1 (Left); Arthritis of knee, degenerative; Failed back surgical syndrome; Acquired trigger finger; Low back pain radiating to lower extremity (Right); PTTD (posterior tibial tendon dysfunction); Chronic pain syndrome; Abnormal CT scan, cervical spine (11/19/2022); Abnormal MRI, lumbar spine (09/25/2023); DDD (degenerative disc disease), cervical; DDD (degenerative disc disease), lumbar; Cervical foraminal stenosis (C3-C7); Chronic low back pain (1ry area of Pain) (Bilateral) (R>L) w/o sciatica; Chronic lower extremity pain (2ry area of Pain) (Right); Osteoarthritis of shoulder (Right); Osteoarthritis of knees (Bilateral); Chronic hip pain (3ry area of Pain) (Right); Lumbar facet joint pain; Lumbar facet joint syndrome; Arthropathy of hip  (Right); Osteoarthritis of hip (Right); 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  Pain Assessment: Severity of   is reported as a  /10. Location:    / . Onset:  . Quality:  . Timing:  . Modifying factor(s):  SABRA Vitals:  vitals were not taken for this visit.  BMI: Estimated body mass index is 24.27 kg/m as calculated from the following:   Height as of 06/26/24: 5' 3 (1.6 m).   Weight as of 06/26/24: 137 lb (62.1 kg).  Last encounter: 06/08/2024. Last procedure: 06/16/2024.  Reason for encounter: post-procedure evaluation and assessment.   Discussed the use of AI scribe software for clinical note transcription with the patient, who gave verbal consent to proceed.  History of Present Illness          Post-Procedure Evaluation   Type: Lumbar Facet, Medial Branch Block(s) (w/ fluoroscopic mapping) #2  Laterality: Bilateral  Level: L2, L3, L4, L5, and S1 Medial Branch/Dorsal Rami Level(s). Injecting these levels blocks the L3-4, L4-5, and L5-S1 lumbar facet joints. Imaging: Fluoroscopic guidance Spinal ([id number]) Anesthesia: Local anesthesia (1-2% Lidocaine ) Anxiolysis: IV Versed  2.0 mg Sedation: Moderate Sedation Fentanyl  1 mL (50 mcg) DOS: 06/16/2024 Performed by: Eric DELENA Como, MD  Primary Purpose: Diagnostic/Therapeutic Indications: Low back pain severe enough to impact quality of life or function. 1. Chronic low back pain (1ry area of Pain) (Bilateral) (R>L) w/o sciatica   2. Lumbar facet joint pain   3. Lumbar facet hypertrophy  (Multilevel) (Bilateral)   4. Lumbar  facet joint syndrome   5. Lumbar facet arthropathy   6. 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 Chronic low back pain of over 3 months duration   8. Spondylosis without myelopathy or radiculopathy, lumbosacral region    NAS-11 Pain score:   Pre-procedure: 8 /10   Post-procedure: 8 /10     Effectiveness:  Initial hour after procedure:   ***. Subsequent 4-6 hours post-procedure:   ***. Analgesia past initial 6 hours:   ***. Ongoing improvement:  Analgesic:  *** Function:    ***    ROM:    ***     Pharmacotherapy Assessment   Opioid Analgesic(s):  No chronic opioid analgesics therapy prescribed by our practice. None MME/day: 0 mg/day   Monitoring: [place] PMP: PDMP reviewed during this encounter.       Pharmacotherapy: No side-effects or adverse reactions reported. Compliance: No problems identified. Effectiveness: Clinically acceptable.  No notes on file  UDS:  Summary  Date Value Ref Range Status  06/19/2023 Note  Final    Comment:    ==================================================================== Compliance Drug Analysis, Ur ==================================================================== Test                             Result       Flag       Units  Drug Present and Declared for Prescription Verification   Gabapentin                      PRESENT      EXPECTED   Mirtazapine                     PRESENT      EXPECTED   Hydroxyzine                     PRESENT      EXPECTED   Metoprolol                      PRESENT      EXPECTED  Drug Absent but Declared for Prescription Verification   Levetiracetam                   Not Detected UNEXPECTED ==================================================================== Test                      Result    Flag   Units      Ref Range   Creatinine              60               mg/dL      >=79 ==================================================================== Declared  Medications:  The flagging and interpretation on this report are based on the  following declared medications.  Unexpected results may arise from  inaccuracies in the declared medications.   **Note: The testing scope of this panel includes these medications:   Gabapentin  (Neurontin )  Hydroxyzine   Levetiracetam  (Keppra )  Metoprolol   Mirtazapine  (Remeron )   **Note: The testing scope of this panel does not include the  following reported medications:   Atorvastatin   Calcitriol   Diazoxide  (Proglycem )  Linaclotide  (Linzess )  Rosuvastatin  (Crestor )  Triamcinolone  (Kenalog ) ==================================================================== For clinical consultation, please call [phone]. ====================================================================     No results found for: CBDTHCR No results found for: D8THCCBX No results found for: D9THCCBX  ROS  Constitutional: Denies any fever or chills Gastrointestinal: No  reported hemesis, hematochezia, vomiting, or acute GI distress Musculoskeletal: Denies any acute onset joint swelling, redness, loss of ROM, or weakness Neurological: No reported episodes of acute onset apraxia, aphasia, dysarthria, agnosia, amnesia, paralysis, loss of coordination, or loss of consciousness  Medication Review  Accu-Chek Softclix Lancets, atorvastatin , blood glucose meter kit and supplies, calcitRIOL , diazoxide , diphenhydrAMINE , gabapentin , glucose blood, hydrOXYzine , hydrochlorothiazide , ibuprofen, levETIRAcetam , linaclotide , meclizine , metoprolol  succinate, mirtazapine , omeprazole , ondansetron , predniSONE , rivastigmine , rosuvastatin , traZODone , and triamcinolone  cream  History Review  Allergy: Ms. Canny is allergic to lotrel [amlodipine  besy-benazepril hcl], contrast media [iodinated contrast media], niacin, and sulfa antibiotics. Drug: Ms. Else  reports no history of drug use. Alcohol:  reports no history of alcohol use. Tobacco:   reports that she has never smoked. She has never used smokeless tobacco. Social: Ms. Yaeger  reports that she has never smoked. She has never used smokeless tobacco. She reports that she does not drink alcohol and does not use drugs. Medical:  has a past medical history of Anemia, Arthritis, COVID-19 (11/19/2019), Diverticulitis, DM (diabetes mellitus) (HCC), History of methicillin resistant staphylococcus aureus (MRSA) (2017), HTN (hypertension), Hyperlipidemia, L-S radiculopathy (06/11/2015), Left leg swelling (01/22/2023), Lower abdominal pain (08/08/2022), Memory difficulties (09/01/2015), Multiple thyroid  nodules, Myocardial infarction (HCC) (1995), Partial small bowel obstruction (HCC) (04/23/2016), SBO (small bowel obstruction) (HCC), Short-term memory loss, and Thyroid  nodule. Surgical: Ms. Arrazola  has a past surgical history that includes Hemicolectomy; Lumbar disc surgery; Roux-en-Y Gastric Bypass (2010); Cholecystectomy; Eye surgery; Hand surgery; Foot surgery; Abdominal hysterectomy; Tonsillectomy; Upper gi endoscopy (01/12/2016); Cardiac catheterization (2000); Laparoscopic lysis of adhesions (N/A, 08/21/2016); Ventral hernia repair (N/A, 08/21/2016); Insertion of mesh (N/A, 08/21/2016); Cystocele repair (N/A, 03/19/2017); Cystoscopy (N/A, 03/19/2017); Laparoscopic removal abdominal mass; LOOP RECORDER INSERTION (N/A, 03/21/2018); laparotomy (N/A, 04/16/2019); XI robotic assisted ventral hernia (N/A, 12/08/2019); Insertion of mesh (N/A, 12/08/2019); Breast excisional biopsy (Right, 1971); Small intestine surgery; and Esophagogastroduodenoscopy (egd) with propofol  (N/A, 12/15/2021). Family: family history includes Breast cancer in her paternal grandmother; Breast cancer (age of onset: 65) in her paternal aunt; Breast cancer (age of onset: 19) in her paternal aunt; COPD in her mother; Cancer in her paternal aunt and paternal grandmother; Cancer (age of onset: 15) in her father; Congestive Heart  Failure in her maternal grandmother; Coronary artery disease (age of onset: 64) in her father; Diabetes in her mother; Emphysema in her maternal grandfather; Heart attack in her paternal grandfather; Hypertension in her mother.  Laboratory Chemistry Profile   Renal Lab Results  Component Value Date   BUN 12 06/03/2024   CREATININE 1.46 (H) 06/03/2024   BCR 8 (L) 06/03/2024   GFRAA 42 (L) 12/05/2020   GFRNONAA 42 (L) 06/19/2023    Hepatic Lab Results  Component Value Date   AST 27 06/03/2024   ALT 19 06/03/2024   ALBUMIN 3.8 06/03/2024   ALKPHOS 174 (H) 06/03/2024   LIPASE 27 08/31/2022   AMMONIA 68 (H) 11/15/2012    Electrolytes Lab Results  Component Value Date   NA 145 (H) 06/03/2024   K 3.7 06/03/2024   CL 108 (H) 06/03/2024   CALCIUM  8.7 06/03/2024   MG 2.2 06/19/2023   PHOS 3.4 11/20/2022    Bone Lab Results  Component Value Date   VD25OH 51.23 08/09/2022   25OHVITD1 39 06/19/2023   25OHVITD2 1.0 06/19/2023   25OHVITD3 38 06/19/2023    Inflammation (CRP: Acute Phase) (ESR: Chronic Phase) Lab Results  Component Value Date   CRP 1 06/03/2024   ESRSEDRATE 13 06/03/2024   LATICACIDVEN  1.1 08/21/2022         Note: Above Lab results reviewed.  Recent Imaging Review  DG PAIN CLINIC C-ARM 1-60 MIN NO REPORT Fluoro was used, but no Radiologist interpretation will be provided.  Please refer to NOTES tab for provider progress note. Note: Reviewed        Physical Exam  Vitals: There were no vitals taken for this visit. BMI: Estimated body mass index is 24.27 kg/m as calculated from the following:   Height as of 06/26/24: 5' 3 (1.6 m).   Weight as of 06/26/24: 137 lb (62.1 kg). Ideal: Ideal body weight: 52.4 kg (115 lb 8.3 oz) Adjusted ideal body weight: 56.3 kg (124 lb 1.8 oz) General appearance: Well nourished, well developed, and well hydrated. In no apparent acute distress Mental status: Alert, oriented x 3 (person, place, & time)       Respiratory: No  evidence of acute respiratory distress Eyes: PERLA   Assessment   Diagnosis Status  1. Chronic low back pain (1ry area of Pain) (Bilateral) (R>L) w/o sciatica   2. Lumbar facet joint pain   3. Lumbar facet joint syndrome   4. Chronic low back pain of over 3 months duration   5. Postop check    Controlled Controlled Controlled   Updated Problems: No problems updated.  Plan of Care  Problem-specific:  Assessment and Plan            Ms. KYLAN VEACH has a current medication list which includes the following long-term medication(s): accu-chek guide, diphenhydramine , diphenhydramine , gabapentin , hydrochlorothiazide , levetiracetam , linaclotide , metoprolol  succinate, mirtazapine , omeprazole , and rosuvastatin .  Pharmacotherapy (Medications Ordered): No orders of the defined types were placed in this encounter.  Orders:  No orders of the defined types were placed in this encounter.    Interventional Therapies  Risk Factors  Considerations:  Cardiologist: Cara Lovelace, MD ([facility])  Neurologist: Jannett Fairly, MD [facility] neurology) Hx of MI (1995)  Hx of TIAs  T2DM  Stage 3b CKD  [name]  Memory impairment  Hx of Bowel Obstruction (04/2019)  GERD  s/p Gastric Bypass for obesity  Malnutrition-malabsorption  Hx. Seizures      Planned  Pending:   Diagnostic bilateral lumbar facet MBB #2 with possible radiofrequency ablation follow-up.   Under consideration:   Diagnostic left caudal ESI #2  Diagnostic bilateral lumbar facet MBB #2  Diagnostic/therapeutic bilateral IA hip joint injection #1  Diagnostic/therapeutic bilateral SI joint block #1    Completed:   Diagnostic bilateral lumbar facet MBB x1 (09/10/2023) (100/100/80 x 1 month)  Diagnostic left caudal ESI x2 (contrast allergy protocol ordered) (05/05/2024) (100/100/50/LE:100%LBP:50%)   Therapeutic  Palliative (PRN) options:   None established   Completed by other providers:    Therapeutic/diagnostic right L5 TFESI x2 (04/11/2016, 05/02/2016) by Morene Falcon, DO [facility])  Therapeutic/diagnostic right S1 TFESI x2 (04/11/2016, 05/02/2016) by Morene Falcon, DO [facility])  Therapeutic/diagnostic right L3 TFESI x1 (07/19/2016) by Morene Falcon, DO ([facility])      No follow-ups on file.    Recent Visits Date Type Provider Dept  06/16/24 Procedure visit Tanya Glisson, MD [facility]  06/08/24 Office Visit Tanya Glisson, MD [facility]  05/05/24 Procedure visit Tanya Glisson, MD [facility]  05/04/24 Office Visit Tanya Glisson, MD [facility]  Showing recent visits within past 90 days and meeting all other requirements Future Appointments Date Type Provider Dept  07/02/24 Appointment Tanya Glisson, MD [facility]  Showing future appointments within next 90 days and meeting all other requirements  I discussed the assessment and treatment plan with the patient. The patient was provided an opportunity to ask questions and all were answered. The patient agreed with the plan and demonstrated an understanding of the instructions.  Patient advised to call back or seek an in-person evaluation if the symptoms or condition worsens.  Duration of encounter: *** minutes.  Total time on encounter, as per AMA guidelines included both the face-to-face and non-face-to-face time personally spent by the physician and/or other qualified health care professional(s) on the day of the encounter (includes time in activities that require the physician or other qualified health care professional and does not include time in activities normally performed by clinical staff). Physician's time may include the following activities when performed: Preparing to see the patient (e.g., pre-charting review of records, searching for previously ordered imaging, lab work, and nerve conduction tests) Review of prior analgesic  pharmacotherapies. Reviewing PMP Interpreting ordered tests (e.g., lab work, imaging, nerve conduction tests) Performing post-procedure evaluations, including interpretation of diagnostic procedures Obtaining and/or reviewing separately obtained history Performing a medically appropriate examination and/or evaluation Counseling and educating the patient/family/caregiver Ordering medications, tests, or procedures Referring and communicating with other health care professionals (when not separately reported) Documenting clinical information in the electronic or other health record Independently interpreting results (not separately reported) and communicating results to the patient/ family/caregiver Care coordination (not separately reported)  Note by: Eric DELENA Como, MD (TTS and AI technology used. I apologize for any typographical errors that were not detected and corrected.) Date: 07/02/2024; Time: 6:15 AM

## 2024-07-02 ENCOUNTER — Ambulatory Visit: Attending: Pain Medicine | Admitting: Pain Medicine

## 2024-07-02 ENCOUNTER — Encounter: Payer: Self-pay | Admitting: Pain Medicine

## 2024-07-02 VITALS — BP 145/78 | HR 58 | Temp 97.3°F | Resp 13 | Ht 63.0 in | Wt 132.0 lb

## 2024-07-02 DIAGNOSIS — M5459 Other low back pain: Secondary | ICD-10-CM | POA: Diagnosis not present

## 2024-07-02 DIAGNOSIS — Z09 Encounter for follow-up examination after completed treatment for conditions other than malignant neoplasm: Secondary | ICD-10-CM | POA: Insufficient documentation

## 2024-07-02 DIAGNOSIS — G8929 Other chronic pain: Secondary | ICD-10-CM | POA: Diagnosis not present

## 2024-07-02 DIAGNOSIS — M545 Low back pain, unspecified: Secondary | ICD-10-CM | POA: Diagnosis not present

## 2024-07-02 DIAGNOSIS — M47816 Spondylosis without myelopathy or radiculopathy, lumbar region: Secondary | ICD-10-CM | POA: Diagnosis not present

## 2024-07-04 DIAGNOSIS — Z8673 Personal history of transient ischemic attack (TIA), and cerebral infarction without residual deficits: Secondary | ICD-10-CM | POA: Diagnosis not present

## 2024-07-04 DIAGNOSIS — I252 Old myocardial infarction: Secondary | ICD-10-CM | POA: Diagnosis not present

## 2024-07-04 DIAGNOSIS — Z87891 Personal history of nicotine dependence: Secondary | ICD-10-CM | POA: Diagnosis not present

## 2024-07-04 DIAGNOSIS — E042 Nontoxic multinodular goiter: Secondary | ICD-10-CM | POA: Diagnosis not present

## 2024-07-04 DIAGNOSIS — N1832 Chronic kidney disease, stage 3b: Secondary | ICD-10-CM | POA: Diagnosis not present

## 2024-07-04 DIAGNOSIS — Z9181 History of falling: Secondary | ICD-10-CM | POA: Diagnosis not present

## 2024-07-04 DIAGNOSIS — M5417 Radiculopathy, lumbosacral region: Secondary | ICD-10-CM | POA: Diagnosis not present

## 2024-07-04 DIAGNOSIS — D631 Anemia in chronic kidney disease: Secondary | ICD-10-CM | POA: Diagnosis not present

## 2024-07-04 DIAGNOSIS — I129 Hypertensive chronic kidney disease with stage 1 through stage 4 chronic kidney disease, or unspecified chronic kidney disease: Secondary | ICD-10-CM | POA: Diagnosis not present

## 2024-07-04 DIAGNOSIS — G4700 Insomnia, unspecified: Secondary | ICD-10-CM | POA: Diagnosis not present

## 2024-07-04 DIAGNOSIS — Z8614 Personal history of Methicillin resistant Staphylococcus aureus infection: Secondary | ICD-10-CM | POA: Diagnosis not present

## 2024-07-04 DIAGNOSIS — E1122 Type 2 diabetes mellitus with diabetic chronic kidney disease: Secondary | ICD-10-CM | POA: Diagnosis not present

## 2024-07-04 DIAGNOSIS — Z556 Problems related to health literacy: Secondary | ICD-10-CM | POA: Diagnosis not present

## 2024-07-04 DIAGNOSIS — E785 Hyperlipidemia, unspecified: Secondary | ICD-10-CM | POA: Diagnosis not present

## 2024-07-04 DIAGNOSIS — K5900 Constipation, unspecified: Secondary | ICD-10-CM | POA: Diagnosis not present

## 2024-07-07 ENCOUNTER — Telehealth: Payer: Self-pay

## 2024-07-07 DIAGNOSIS — Z87891 Personal history of nicotine dependence: Secondary | ICD-10-CM | POA: Diagnosis not present

## 2024-07-07 DIAGNOSIS — Z8673 Personal history of transient ischemic attack (TIA), and cerebral infarction without residual deficits: Secondary | ICD-10-CM | POA: Diagnosis not present

## 2024-07-07 DIAGNOSIS — E042 Nontoxic multinodular goiter: Secondary | ICD-10-CM | POA: Diagnosis not present

## 2024-07-07 DIAGNOSIS — M5417 Radiculopathy, lumbosacral region: Secondary | ICD-10-CM | POA: Diagnosis not present

## 2024-07-07 DIAGNOSIS — Z8614 Personal history of Methicillin resistant Staphylococcus aureus infection: Secondary | ICD-10-CM | POA: Diagnosis not present

## 2024-07-07 DIAGNOSIS — I129 Hypertensive chronic kidney disease with stage 1 through stage 4 chronic kidney disease, or unspecified chronic kidney disease: Secondary | ICD-10-CM | POA: Diagnosis not present

## 2024-07-07 DIAGNOSIS — D631 Anemia in chronic kidney disease: Secondary | ICD-10-CM | POA: Diagnosis not present

## 2024-07-07 DIAGNOSIS — E785 Hyperlipidemia, unspecified: Secondary | ICD-10-CM | POA: Diagnosis not present

## 2024-07-07 DIAGNOSIS — Z556 Problems related to health literacy: Secondary | ICD-10-CM | POA: Diagnosis not present

## 2024-07-07 DIAGNOSIS — K5900 Constipation, unspecified: Secondary | ICD-10-CM | POA: Diagnosis not present

## 2024-07-07 DIAGNOSIS — I252 Old myocardial infarction: Secondary | ICD-10-CM | POA: Diagnosis not present

## 2024-07-07 DIAGNOSIS — G4700 Insomnia, unspecified: Secondary | ICD-10-CM | POA: Diagnosis not present

## 2024-07-07 DIAGNOSIS — E1122 Type 2 diabetes mellitus with diabetic chronic kidney disease: Secondary | ICD-10-CM | POA: Diagnosis not present

## 2024-07-07 DIAGNOSIS — Z9181 History of falling: Secondary | ICD-10-CM | POA: Diagnosis not present

## 2024-07-07 DIAGNOSIS — N1832 Chronic kidney disease, stage 3b: Secondary | ICD-10-CM | POA: Diagnosis not present

## 2024-07-07 NOTE — Telephone Encounter (Signed)
Ok for verbal orders.    Andreya Lacks Simmons-Robinson, MD  [facility]  

## 2024-07-07 NOTE — Telephone Encounter (Signed)
 Called and spoke to Julie Acevedo, to inform her of her verbal approval for the physical therapy per DR R. She stated she understood and would send over the orders

## 2024-07-07 NOTE — Telephone Encounter (Signed)
 Copied from CRM [id number]. Topic: Clinical - Home Health Verbal Orders >> Jul 06, 2024  3:20 PM Avram MATSU wrote: Caller/Agency: [facility]assah [facility] Callback Number: [phone] secure line Service Requested: Physical Therapy Frequency: 1 week 9 Any new concerns about the patient? No >> Jul 07, 2024  9:02 AM Tobias CROME wrote:  Hadassah w/ [facility] following up on verbal orders request. Advised awaiting response.

## 2024-07-07 NOTE — Telephone Encounter (Signed)
 Left message for Julie Acevedo  with [facility] to call us  back.

## 2024-07-09 ENCOUNTER — Telehealth: Payer: Self-pay

## 2024-07-09 NOTE — Telephone Encounter (Signed)
Ok for verbal orders.    Andreya Lacks Simmons-Robinson, MD  [facility]  

## 2024-07-09 NOTE — Telephone Encounter (Signed)
 Copied from CRM [id number]. Topic: Clinical - Home Health Verbal Orders >> Jul 09, 2024  1:40 PM Montie POUR wrote: Caller/Agency: Signe Edison Callback Number: [phone] He has a secured voicemail to leave a message Service Requested: Occupational Therapy Frequency: 4 X for 1 week Any new concerns about the patient? No

## 2024-07-09 NOTE — Telephone Encounter (Signed)
 Left message with ok verbal orders. Since message states 'secured voicemail to leave a message '

## 2024-07-14 ENCOUNTER — Other Ambulatory Visit: Payer: Self-pay | Admitting: Family Medicine

## 2024-07-14 DIAGNOSIS — R42 Dizziness and giddiness: Secondary | ICD-10-CM

## 2024-07-15 DIAGNOSIS — E1122 Type 2 diabetes mellitus with diabetic chronic kidney disease: Secondary | ICD-10-CM | POA: Diagnosis not present

## 2024-07-15 DIAGNOSIS — I252 Old myocardial infarction: Secondary | ICD-10-CM | POA: Diagnosis not present

## 2024-07-15 DIAGNOSIS — G4700 Insomnia, unspecified: Secondary | ICD-10-CM | POA: Diagnosis not present

## 2024-07-15 DIAGNOSIS — E042 Nontoxic multinodular goiter: Secondary | ICD-10-CM | POA: Diagnosis not present

## 2024-07-15 DIAGNOSIS — I129 Hypertensive chronic kidney disease with stage 1 through stage 4 chronic kidney disease, or unspecified chronic kidney disease: Secondary | ICD-10-CM | POA: Diagnosis not present

## 2024-07-15 DIAGNOSIS — Z8673 Personal history of transient ischemic attack (TIA), and cerebral infarction without residual deficits: Secondary | ICD-10-CM | POA: Diagnosis not present

## 2024-07-15 DIAGNOSIS — Z556 Problems related to health literacy: Secondary | ICD-10-CM | POA: Diagnosis not present

## 2024-07-15 DIAGNOSIS — Z9181 History of falling: Secondary | ICD-10-CM | POA: Diagnosis not present

## 2024-07-15 DIAGNOSIS — N1832 Chronic kidney disease, stage 3b: Secondary | ICD-10-CM | POA: Diagnosis not present

## 2024-07-15 DIAGNOSIS — K5900 Constipation, unspecified: Secondary | ICD-10-CM | POA: Diagnosis not present

## 2024-07-15 DIAGNOSIS — M5417 Radiculopathy, lumbosacral region: Secondary | ICD-10-CM | POA: Diagnosis not present

## 2024-07-15 DIAGNOSIS — E785 Hyperlipidemia, unspecified: Secondary | ICD-10-CM | POA: Diagnosis not present

## 2024-07-15 DIAGNOSIS — Z8614 Personal history of Methicillin resistant Staphylococcus aureus infection: Secondary | ICD-10-CM | POA: Diagnosis not present

## 2024-07-15 DIAGNOSIS — Z87891 Personal history of nicotine dependence: Secondary | ICD-10-CM | POA: Diagnosis not present

## 2024-07-15 DIAGNOSIS — D631 Anemia in chronic kidney disease: Secondary | ICD-10-CM | POA: Diagnosis not present

## 2024-07-16 DIAGNOSIS — Z8614 Personal history of Methicillin resistant Staphylococcus aureus infection: Secondary | ICD-10-CM | POA: Diagnosis not present

## 2024-07-16 DIAGNOSIS — I129 Hypertensive chronic kidney disease with stage 1 through stage 4 chronic kidney disease, or unspecified chronic kidney disease: Secondary | ICD-10-CM | POA: Diagnosis not present

## 2024-07-16 DIAGNOSIS — D631 Anemia in chronic kidney disease: Secondary | ICD-10-CM | POA: Diagnosis not present

## 2024-07-16 DIAGNOSIS — E1122 Type 2 diabetes mellitus with diabetic chronic kidney disease: Secondary | ICD-10-CM | POA: Diagnosis not present

## 2024-07-16 DIAGNOSIS — Z8673 Personal history of transient ischemic attack (TIA), and cerebral infarction without residual deficits: Secondary | ICD-10-CM | POA: Diagnosis not present

## 2024-07-16 DIAGNOSIS — M5417 Radiculopathy, lumbosacral region: Secondary | ICD-10-CM | POA: Diagnosis not present

## 2024-07-16 DIAGNOSIS — E785 Hyperlipidemia, unspecified: Secondary | ICD-10-CM | POA: Diagnosis not present

## 2024-07-16 DIAGNOSIS — Z87891 Personal history of nicotine dependence: Secondary | ICD-10-CM | POA: Diagnosis not present

## 2024-07-16 DIAGNOSIS — Z9181 History of falling: Secondary | ICD-10-CM | POA: Diagnosis not present

## 2024-07-16 DIAGNOSIS — K5900 Constipation, unspecified: Secondary | ICD-10-CM | POA: Diagnosis not present

## 2024-07-16 DIAGNOSIS — G4700 Insomnia, unspecified: Secondary | ICD-10-CM | POA: Diagnosis not present

## 2024-07-16 DIAGNOSIS — N1832 Chronic kidney disease, stage 3b: Secondary | ICD-10-CM | POA: Diagnosis not present

## 2024-07-16 DIAGNOSIS — I252 Old myocardial infarction: Secondary | ICD-10-CM | POA: Diagnosis not present

## 2024-07-16 DIAGNOSIS — Z556 Problems related to health literacy: Secondary | ICD-10-CM | POA: Diagnosis not present

## 2024-07-16 DIAGNOSIS — E042 Nontoxic multinodular goiter: Secondary | ICD-10-CM | POA: Diagnosis not present

## 2024-07-20 DIAGNOSIS — K5900 Constipation, unspecified: Secondary | ICD-10-CM | POA: Diagnosis not present

## 2024-07-20 DIAGNOSIS — E042 Nontoxic multinodular goiter: Secondary | ICD-10-CM | POA: Diagnosis not present

## 2024-07-20 DIAGNOSIS — G4700 Insomnia, unspecified: Secondary | ICD-10-CM | POA: Diagnosis not present

## 2024-07-20 DIAGNOSIS — E785 Hyperlipidemia, unspecified: Secondary | ICD-10-CM | POA: Diagnosis not present

## 2024-07-20 DIAGNOSIS — Z556 Problems related to health literacy: Secondary | ICD-10-CM | POA: Diagnosis not present

## 2024-07-20 DIAGNOSIS — N1832 Chronic kidney disease, stage 3b: Secondary | ICD-10-CM | POA: Diagnosis not present

## 2024-07-20 DIAGNOSIS — D631 Anemia in chronic kidney disease: Secondary | ICD-10-CM | POA: Diagnosis not present

## 2024-07-20 DIAGNOSIS — Z8614 Personal history of Methicillin resistant Staphylococcus aureus infection: Secondary | ICD-10-CM | POA: Diagnosis not present

## 2024-07-20 DIAGNOSIS — I252 Old myocardial infarction: Secondary | ICD-10-CM | POA: Diagnosis not present

## 2024-07-20 DIAGNOSIS — M5417 Radiculopathy, lumbosacral region: Secondary | ICD-10-CM | POA: Diagnosis not present

## 2024-07-20 DIAGNOSIS — I129 Hypertensive chronic kidney disease with stage 1 through stage 4 chronic kidney disease, or unspecified chronic kidney disease: Secondary | ICD-10-CM | POA: Diagnosis not present

## 2024-07-20 DIAGNOSIS — E1122 Type 2 diabetes mellitus with diabetic chronic kidney disease: Secondary | ICD-10-CM | POA: Diagnosis not present

## 2024-07-20 DIAGNOSIS — Z8673 Personal history of transient ischemic attack (TIA), and cerebral infarction without residual deficits: Secondary | ICD-10-CM | POA: Diagnosis not present

## 2024-07-20 DIAGNOSIS — Z9181 History of falling: Secondary | ICD-10-CM | POA: Diagnosis not present

## 2024-07-20 DIAGNOSIS — Z87891 Personal history of nicotine dependence: Secondary | ICD-10-CM | POA: Diagnosis not present

## 2024-07-23 DIAGNOSIS — I129 Hypertensive chronic kidney disease with stage 1 through stage 4 chronic kidney disease, or unspecified chronic kidney disease: Secondary | ICD-10-CM | POA: Diagnosis not present

## 2024-07-23 DIAGNOSIS — E042 Nontoxic multinodular goiter: Secondary | ICD-10-CM | POA: Diagnosis not present

## 2024-07-23 DIAGNOSIS — E1122 Type 2 diabetes mellitus with diabetic chronic kidney disease: Secondary | ICD-10-CM | POA: Diagnosis not present

## 2024-07-23 DIAGNOSIS — Z8614 Personal history of Methicillin resistant Staphylococcus aureus infection: Secondary | ICD-10-CM | POA: Diagnosis not present

## 2024-07-23 DIAGNOSIS — Z8673 Personal history of transient ischemic attack (TIA), and cerebral infarction without residual deficits: Secondary | ICD-10-CM | POA: Diagnosis not present

## 2024-07-23 DIAGNOSIS — M5417 Radiculopathy, lumbosacral region: Secondary | ICD-10-CM | POA: Diagnosis not present

## 2024-07-23 DIAGNOSIS — K5900 Constipation, unspecified: Secondary | ICD-10-CM | POA: Diagnosis not present

## 2024-07-23 DIAGNOSIS — D631 Anemia in chronic kidney disease: Secondary | ICD-10-CM | POA: Diagnosis not present

## 2024-07-23 DIAGNOSIS — Z9181 History of falling: Secondary | ICD-10-CM | POA: Diagnosis not present

## 2024-07-23 DIAGNOSIS — G4700 Insomnia, unspecified: Secondary | ICD-10-CM | POA: Diagnosis not present

## 2024-07-23 DIAGNOSIS — Z87891 Personal history of nicotine dependence: Secondary | ICD-10-CM | POA: Diagnosis not present

## 2024-07-23 DIAGNOSIS — I252 Old myocardial infarction: Secondary | ICD-10-CM | POA: Diagnosis not present

## 2024-07-23 DIAGNOSIS — E785 Hyperlipidemia, unspecified: Secondary | ICD-10-CM | POA: Diagnosis not present

## 2024-07-23 DIAGNOSIS — Z556 Problems related to health literacy: Secondary | ICD-10-CM | POA: Diagnosis not present

## 2024-07-23 DIAGNOSIS — N1832 Chronic kidney disease, stage 3b: Secondary | ICD-10-CM | POA: Diagnosis not present

## 2024-07-29 DIAGNOSIS — E569 Vitamin deficiency, unspecified: Secondary | ICD-10-CM | POA: Diagnosis not present

## 2024-07-29 DIAGNOSIS — G4710 Hypersomnia, unspecified: Secondary | ICD-10-CM | POA: Diagnosis not present

## 2024-07-29 DIAGNOSIS — R2689 Other abnormalities of gait and mobility: Secondary | ICD-10-CM | POA: Diagnosis not present

## 2024-07-30 DIAGNOSIS — Z87891 Personal history of nicotine dependence: Secondary | ICD-10-CM | POA: Diagnosis not present

## 2024-07-30 DIAGNOSIS — N1832 Chronic kidney disease, stage 3b: Secondary | ICD-10-CM | POA: Diagnosis not present

## 2024-07-30 DIAGNOSIS — G4700 Insomnia, unspecified: Secondary | ICD-10-CM | POA: Diagnosis not present

## 2024-07-30 DIAGNOSIS — I129 Hypertensive chronic kidney disease with stage 1 through stage 4 chronic kidney disease, or unspecified chronic kidney disease: Secondary | ICD-10-CM | POA: Diagnosis not present

## 2024-07-30 DIAGNOSIS — Z9181 History of falling: Secondary | ICD-10-CM | POA: Diagnosis not present

## 2024-07-30 DIAGNOSIS — K5900 Constipation, unspecified: Secondary | ICD-10-CM | POA: Diagnosis not present

## 2024-07-30 DIAGNOSIS — E042 Nontoxic multinodular goiter: Secondary | ICD-10-CM | POA: Diagnosis not present

## 2024-07-30 DIAGNOSIS — E1122 Type 2 diabetes mellitus with diabetic chronic kidney disease: Secondary | ICD-10-CM | POA: Diagnosis not present

## 2024-07-30 DIAGNOSIS — I252 Old myocardial infarction: Secondary | ICD-10-CM | POA: Diagnosis not present

## 2024-07-30 DIAGNOSIS — D631 Anemia in chronic kidney disease: Secondary | ICD-10-CM | POA: Diagnosis not present

## 2024-07-30 DIAGNOSIS — E785 Hyperlipidemia, unspecified: Secondary | ICD-10-CM | POA: Diagnosis not present

## 2024-07-30 DIAGNOSIS — Z556 Problems related to health literacy: Secondary | ICD-10-CM | POA: Diagnosis not present

## 2024-07-30 DIAGNOSIS — Z8614 Personal history of Methicillin resistant Staphylococcus aureus infection: Secondary | ICD-10-CM | POA: Diagnosis not present

## 2024-07-30 DIAGNOSIS — Z8673 Personal history of transient ischemic attack (TIA), and cerebral infarction without residual deficits: Secondary | ICD-10-CM | POA: Diagnosis not present

## 2024-07-30 DIAGNOSIS — M5417 Radiculopathy, lumbosacral region: Secondary | ICD-10-CM | POA: Diagnosis not present

## 2024-07-31 ENCOUNTER — Telehealth: Payer: Self-pay

## 2024-07-31 DIAGNOSIS — N1832 Chronic kidney disease, stage 3b: Secondary | ICD-10-CM | POA: Diagnosis not present

## 2024-07-31 DIAGNOSIS — K5900 Constipation, unspecified: Secondary | ICD-10-CM | POA: Diagnosis not present

## 2024-07-31 DIAGNOSIS — M170 Bilateral primary osteoarthritis of knee: Secondary | ICD-10-CM

## 2024-07-31 DIAGNOSIS — E042 Nontoxic multinodular goiter: Secondary | ICD-10-CM | POA: Diagnosis not present

## 2024-07-31 DIAGNOSIS — I129 Hypertensive chronic kidney disease with stage 1 through stage 4 chronic kidney disease, or unspecified chronic kidney disease: Secondary | ICD-10-CM | POA: Diagnosis not present

## 2024-07-31 DIAGNOSIS — M961 Postlaminectomy syndrome, not elsewhere classified: Secondary | ICD-10-CM

## 2024-07-31 DIAGNOSIS — E440 Moderate protein-calorie malnutrition: Secondary | ICD-10-CM

## 2024-07-31 DIAGNOSIS — Z9181 History of falling: Secondary | ICD-10-CM | POA: Diagnosis not present

## 2024-07-31 DIAGNOSIS — Z556 Problems related to health literacy: Secondary | ICD-10-CM | POA: Diagnosis not present

## 2024-07-31 DIAGNOSIS — E1122 Type 2 diabetes mellitus with diabetic chronic kidney disease: Secondary | ICD-10-CM | POA: Diagnosis not present

## 2024-07-31 DIAGNOSIS — E785 Hyperlipidemia, unspecified: Secondary | ICD-10-CM | POA: Diagnosis not present

## 2024-07-31 DIAGNOSIS — G40201 Localization-related (focal) (partial) symptomatic epilepsy and epileptic syndromes with complex partial seizures, not intractable, with status epilepticus: Secondary | ICD-10-CM

## 2024-07-31 DIAGNOSIS — Z8614 Personal history of Methicillin resistant Staphylococcus aureus infection: Secondary | ICD-10-CM | POA: Diagnosis not present

## 2024-07-31 DIAGNOSIS — R42 Dizziness and giddiness: Secondary | ICD-10-CM

## 2024-07-31 DIAGNOSIS — F02A0 Dementia in other diseases classified elsewhere, mild, without behavioral disturbance, psychotic disturbance, mood disturbance, and anxiety: Secondary | ICD-10-CM

## 2024-07-31 DIAGNOSIS — D631 Anemia in chronic kidney disease: Secondary | ICD-10-CM | POA: Diagnosis not present

## 2024-07-31 DIAGNOSIS — M1611 Unilateral primary osteoarthritis, right hip: Secondary | ICD-10-CM

## 2024-07-31 DIAGNOSIS — M5416 Radiculopathy, lumbar region: Secondary | ICD-10-CM

## 2024-07-31 DIAGNOSIS — M47816 Spondylosis without myelopathy or radiculopathy, lumbar region: Secondary | ICD-10-CM

## 2024-07-31 DIAGNOSIS — Z8673 Personal history of transient ischemic attack (TIA), and cerebral infarction without residual deficits: Secondary | ICD-10-CM | POA: Diagnosis not present

## 2024-07-31 DIAGNOSIS — I252 Old myocardial infarction: Secondary | ICD-10-CM | POA: Diagnosis not present

## 2024-07-31 DIAGNOSIS — M5417 Radiculopathy, lumbosacral region: Secondary | ICD-10-CM

## 2024-07-31 DIAGNOSIS — Z87891 Personal history of nicotine dependence: Secondary | ICD-10-CM | POA: Diagnosis not present

## 2024-07-31 DIAGNOSIS — G4700 Insomnia, unspecified: Secondary | ICD-10-CM | POA: Diagnosis not present

## 2024-07-31 NOTE — Addendum Note (Signed)
 Addended by: SIMMONS-ROBINSON, Ashtyn Freilich L on: 07/31/2024 03:57 PM   Modules accepted: Orders

## 2024-07-31 NOTE — Telephone Encounter (Signed)
 Copied from CRM #[id number]. Topic: Clinical - Order For Equipment >> Jul 31, 2024  9:30 AM Willma SAUNDERS wrote: Reason for CRM: Mallie from [facility] is requesting orders for a Rolling Walker for the patient.  Mallie can be reached at [phone]

## 2024-08-03 ENCOUNTER — Ambulatory Visit (INDEPENDENT_AMBULATORY_CARE_PROVIDER_SITE_OTHER): Admitting: Podiatry

## 2024-08-03 VITALS — Ht 63.0 in | Wt 132.0 lb

## 2024-08-03 DIAGNOSIS — S91111A Laceration without foreign body of right great toe without damage to nail, initial encounter: Secondary | ICD-10-CM

## 2024-08-03 DIAGNOSIS — E113393 Type 2 diabetes mellitus with moderate nonproliferative diabetic retinopathy without macular edema, bilateral: Secondary | ICD-10-CM | POA: Diagnosis not present

## 2024-08-03 NOTE — Telephone Encounter (Signed)
 Order written and sent to provider to be signed and faxed to [facility]

## 2024-08-04 NOTE — Progress Notes (Signed)
  Subjective:  Patient ID: Julie Acevedo, female    DOB: [birth year],  MRN: [id number]  Chief Complaint  Patient presents with   Toe Injury    Rm 8 Patient is here for nail trimming. Patient injured right hallux nail after trimming, accidentally cut the tissue at the end of the nail.    73 y.o. female presents with the above complaint. History confirmed with patient.  This occurred over the weekend.  She has not Dressit there was some bleeding and redness but has not expanded  Objective:  Physical Exam: warm, good capillary refill, there is decreased pedal hair growth, palpable DP and PT pulses, normal sensory exam, and right hallux has a small cut  Assessment:   1. Laceration of right great toe without foreign body present or damage to nail, initial encounter      Plan:  Patient was evaluated and treated and all questions answered.   All toenails debrided in length and thickness with a sharp nail nipper.  The right hallux had a small laceration at the tip from previous nail debridement at home.  Should heal uneventfully.  I recommended utilizing Neosporin and should be able to leave open to air.  Follow-up as needed for this.  She would like to return for regular at risk diabetic footcare.  Return in about 3 months (around 11/03/2024), or if symptoms worsen or fail to improve, for at risk diabetic foot care.

## 2024-08-05 ENCOUNTER — Ambulatory Visit: Payer: Self-pay

## 2024-08-05 DIAGNOSIS — D631 Anemia in chronic kidney disease: Secondary | ICD-10-CM | POA: Diagnosis not present

## 2024-08-05 DIAGNOSIS — I252 Old myocardial infarction: Secondary | ICD-10-CM | POA: Diagnosis not present

## 2024-08-05 DIAGNOSIS — E785 Hyperlipidemia, unspecified: Secondary | ICD-10-CM | POA: Diagnosis not present

## 2024-08-05 DIAGNOSIS — M5417 Radiculopathy, lumbosacral region: Secondary | ICD-10-CM | POA: Diagnosis not present

## 2024-08-05 DIAGNOSIS — G4700 Insomnia, unspecified: Secondary | ICD-10-CM | POA: Diagnosis not present

## 2024-08-05 DIAGNOSIS — G40201 Localization-related (focal) (partial) symptomatic epilepsy and epileptic syndromes with complex partial seizures, not intractable, with status epilepticus: Secondary | ICD-10-CM | POA: Diagnosis not present

## 2024-08-05 DIAGNOSIS — N1832 Chronic kidney disease, stage 3b: Secondary | ICD-10-CM | POA: Diagnosis not present

## 2024-08-05 DIAGNOSIS — G3183 Dementia with Lewy bodies: Secondary | ICD-10-CM | POA: Diagnosis not present

## 2024-08-05 DIAGNOSIS — I129 Hypertensive chronic kidney disease with stage 1 through stage 4 chronic kidney disease, or unspecified chronic kidney disease: Secondary | ICD-10-CM | POA: Diagnosis not present

## 2024-08-05 DIAGNOSIS — E1122 Type 2 diabetes mellitus with diabetic chronic kidney disease: Secondary | ICD-10-CM | POA: Diagnosis not present

## 2024-08-05 DIAGNOSIS — F02A18 Dementia in other diseases classified elsewhere, mild, with other behavioral disturbance: Secondary | ICD-10-CM | POA: Diagnosis not present

## 2024-08-05 DIAGNOSIS — E042 Nontoxic multinodular goiter: Secondary | ICD-10-CM | POA: Diagnosis not present

## 2024-08-05 NOTE — Telephone Encounter (Signed)
Please see the FYI below.

## 2024-08-05 NOTE — Telephone Encounter (Signed)
 FYI Only or Action Required?: FYI only for provider.  Patient was last seen in primary care on 06/26/2024 by Sharma Coyer, MD.  Called Nurse Triage reporting Fatigue.  Symptoms began several weeks ago.  Interventions attempted: Rest, hydration, or home remedies.  Symptoms are: unchanged.Pt. States she saw another doctor and told her she has low B12 and Vit D. She has started supplements and will keep us  up-dated.  Triage Disposition: See PCP When Office is Open (Within 3 Days)  Patient/caregiver understands and will follow disposition?: No, wishes to speak with PCP   Reason for Disposition  [1] MILD weakness (e.g., does not interfere with ability to work, go to school, normal activities) AND [2] persists > 1 week  Answer Assessment - Initial Assessment Questions 1. DESCRIPTION: Describe how you are feeling.     Tired, weak 2. SEVERITY: How bad is it?  Can you stand and walk?     mild 3. ONSET: When did these symptoms begin? (e.g., hours, days, weeks, months)     days 4. CAUSE: What do you think is causing the weakness or fatigue? (e.g., not drinking enough fluids, medical problem, trouble sleeping)     Low B12, Vit D 5. NEW MEDICINES:  Have you started on any new medicines recently? (e.g., opioid pain medicines, benzodiazepines, muscle relaxants, antidepressants, antihistamines, neuroleptics, beta blockers)     NO 6. OTHER SYMPTOMS: Do you have any other symptoms? (e.g., chest pain, fever, cough, SOB, vomiting, diarrhea, bleeding, other areas of pain)     NO 7. PREGNANCY: Is there any chance you are pregnant? When was your last menstrual period?     NO  Protocols used: Weakness (Generalized) and Fatigue-A-AH

## 2024-08-06 DIAGNOSIS — E785 Hyperlipidemia, unspecified: Secondary | ICD-10-CM | POA: Diagnosis not present

## 2024-08-06 DIAGNOSIS — D631 Anemia in chronic kidney disease: Secondary | ICD-10-CM | POA: Diagnosis not present

## 2024-08-06 DIAGNOSIS — I252 Old myocardial infarction: Secondary | ICD-10-CM | POA: Diagnosis not present

## 2024-08-06 DIAGNOSIS — G4700 Insomnia, unspecified: Secondary | ICD-10-CM | POA: Diagnosis not present

## 2024-08-06 DIAGNOSIS — Z556 Problems related to health literacy: Secondary | ICD-10-CM | POA: Diagnosis not present

## 2024-08-06 DIAGNOSIS — E1122 Type 2 diabetes mellitus with diabetic chronic kidney disease: Secondary | ICD-10-CM | POA: Diagnosis not present

## 2024-08-06 DIAGNOSIS — E042 Nontoxic multinodular goiter: Secondary | ICD-10-CM | POA: Diagnosis not present

## 2024-08-06 DIAGNOSIS — Z8673 Personal history of transient ischemic attack (TIA), and cerebral infarction without residual deficits: Secondary | ICD-10-CM | POA: Diagnosis not present

## 2024-08-06 DIAGNOSIS — I129 Hypertensive chronic kidney disease with stage 1 through stage 4 chronic kidney disease, or unspecified chronic kidney disease: Secondary | ICD-10-CM | POA: Diagnosis not present

## 2024-08-06 DIAGNOSIS — N1832 Chronic kidney disease, stage 3b: Secondary | ICD-10-CM | POA: Diagnosis not present

## 2024-08-06 DIAGNOSIS — Z8614 Personal history of Methicillin resistant Staphylococcus aureus infection: Secondary | ICD-10-CM | POA: Diagnosis not present

## 2024-08-06 DIAGNOSIS — Z87891 Personal history of nicotine dependence: Secondary | ICD-10-CM | POA: Diagnosis not present

## 2024-08-06 DIAGNOSIS — K5900 Constipation, unspecified: Secondary | ICD-10-CM | POA: Diagnosis not present

## 2024-08-06 DIAGNOSIS — M5417 Radiculopathy, lumbosacral region: Secondary | ICD-10-CM | POA: Diagnosis not present

## 2024-08-06 DIAGNOSIS — Z9181 History of falling: Secondary | ICD-10-CM | POA: Diagnosis not present

## 2024-08-06 NOTE — Telephone Encounter (Signed)
 PT Telephone encounter reviewed.

## 2024-08-10 ENCOUNTER — Other Ambulatory Visit: Payer: Self-pay | Admitting: Family Medicine

## 2024-08-10 DIAGNOSIS — R42 Dizziness and giddiness: Secondary | ICD-10-CM

## 2024-08-13 DIAGNOSIS — I129 Hypertensive chronic kidney disease with stage 1 through stage 4 chronic kidney disease, or unspecified chronic kidney disease: Secondary | ICD-10-CM | POA: Diagnosis not present

## 2024-08-13 DIAGNOSIS — E1122 Type 2 diabetes mellitus with diabetic chronic kidney disease: Secondary | ICD-10-CM | POA: Diagnosis not present

## 2024-08-13 DIAGNOSIS — Z87891 Personal history of nicotine dependence: Secondary | ICD-10-CM | POA: Diagnosis not present

## 2024-08-13 DIAGNOSIS — K5900 Constipation, unspecified: Secondary | ICD-10-CM | POA: Diagnosis not present

## 2024-08-13 DIAGNOSIS — Z8673 Personal history of transient ischemic attack (TIA), and cerebral infarction without residual deficits: Secondary | ICD-10-CM | POA: Diagnosis not present

## 2024-08-13 DIAGNOSIS — N1832 Chronic kidney disease, stage 3b: Secondary | ICD-10-CM | POA: Diagnosis not present

## 2024-08-13 DIAGNOSIS — Z9181 History of falling: Secondary | ICD-10-CM | POA: Diagnosis not present

## 2024-08-13 DIAGNOSIS — D631 Anemia in chronic kidney disease: Secondary | ICD-10-CM | POA: Diagnosis not present

## 2024-08-13 DIAGNOSIS — Z556 Problems related to health literacy: Secondary | ICD-10-CM | POA: Diagnosis not present

## 2024-08-13 DIAGNOSIS — E042 Nontoxic multinodular goiter: Secondary | ICD-10-CM | POA: Diagnosis not present

## 2024-08-13 DIAGNOSIS — M5417 Radiculopathy, lumbosacral region: Secondary | ICD-10-CM | POA: Diagnosis not present

## 2024-08-13 DIAGNOSIS — G4700 Insomnia, unspecified: Secondary | ICD-10-CM | POA: Diagnosis not present

## 2024-08-13 DIAGNOSIS — I252 Old myocardial infarction: Secondary | ICD-10-CM | POA: Diagnosis not present

## 2024-08-13 DIAGNOSIS — Z8614 Personal history of Methicillin resistant Staphylococcus aureus infection: Secondary | ICD-10-CM | POA: Diagnosis not present

## 2024-08-13 DIAGNOSIS — E785 Hyperlipidemia, unspecified: Secondary | ICD-10-CM | POA: Diagnosis not present

## 2024-08-14 ENCOUNTER — Encounter: Payer: Self-pay | Admitting: Family Medicine

## 2024-08-14 ENCOUNTER — Other Ambulatory Visit: Payer: Self-pay | Admitting: Family Medicine

## 2024-08-14 NOTE — Telephone Encounter (Signed)
 This encounter was created in error - please disregard.  See alternate refill encounter

## 2024-08-14 NOTE — Telephone Encounter (Signed)
 Copied from CRM [id number]. Topic: Clinical - Medication Refill >> Aug 14, 2024  5:16 PM Delon HERO wrote: Medication: Medication linaclotide  (LINZESS ) 145 MCG CAPS capsule [[id number]]  Has the patient contacted their pharmacy? Yes (Agent: If no, request that the patient contact the pharmacy for the refill. If patient does not wish to contact the pharmacy document the reason why and proceed with request.) (Agent: If yes, when and what did the pharmacy advise?)  This is the patient's preferred pharmacy:    [facility] [place]  [place], [place] - [address] [place] [address] Phone: [phone] Fax: [phone]  Is this the correct pharmacy for this prescription? Yes If no, delete pharmacy and type the correct one.   Has the prescription been filled recently? Yes  Is the patient out of the medication? Yes  Has the patient been seen for an appointment in the last year OR does the patient have an upcoming appointment? Yes  Can we respond through MyChart? Yes  Agent: Please be advised that Rx refills may take up to 3 business days. We ask that you follow-up with your pharmacy.

## 2024-08-14 NOTE — Telephone Encounter (Signed)
 Copied from CRM [id number]. Topic: Clinical - Medication Question >> Aug 14, 2024  5:24 PM Donee H wrote: Reason for CRM: Bari a Database administrator from [employer] called on behalf of patient to request refill medication linaclotide  (LINZESS ) 290 MCG. The previous prescription for medication is listed but mcg is showing 145 mcg. Christy stated MCG is showing 290 on her end. Patient is not out of medication but it is time for a refill. Asking for it to be sent to: [facility] [place]  [place], [place] - [address] [place] [address] Phone: [phone] Fax: [phone] Hours: Not open 24 hours

## 2024-08-17 MED ORDER — LINACLOTIDE 145 MCG PO CAPS: 145.0000 ug | ORAL_CAPSULE | Freq: Every day | ORAL | 0 refills | Status: AC

## 2024-08-17 NOTE — Telephone Encounter (Signed)
 Requested Prescriptions  Pending Prescriptions Disp Refills   linaclotide  (LINZESS ) 145 MCG CAPS capsule 90 capsule 0    Sig: Take 1 capsule (145 mcg total) by mouth daily before breakfast.     Gastroenterology: Irritable Bowel Syndrome Passed - 08/17/2024  7:41 AM      Passed - Valid encounter within last 12 months    Recent Outpatient Visits           1 month ago Tremor   [place] [facility] Simmons-Robinson, [place], MD   3 months ago Dysequilibrium   [place] [facility] [place], [place], MD   5 months ago Chronic diarrhea   [facility], Curtis LABOR, FNP   6 months ago Tender lymph node   [place] [facility] Sharma Coyer, MD

## 2024-08-18 DIAGNOSIS — M5417 Radiculopathy, lumbosacral region: Secondary | ICD-10-CM | POA: Diagnosis not present

## 2024-08-18 DIAGNOSIS — Z87891 Personal history of nicotine dependence: Secondary | ICD-10-CM | POA: Diagnosis not present

## 2024-08-18 DIAGNOSIS — D631 Anemia in chronic kidney disease: Secondary | ICD-10-CM | POA: Diagnosis not present

## 2024-08-18 DIAGNOSIS — E1122 Type 2 diabetes mellitus with diabetic chronic kidney disease: Secondary | ICD-10-CM | POA: Diagnosis not present

## 2024-08-18 DIAGNOSIS — I129 Hypertensive chronic kidney disease with stage 1 through stage 4 chronic kidney disease, or unspecified chronic kidney disease: Secondary | ICD-10-CM | POA: Diagnosis not present

## 2024-08-18 DIAGNOSIS — G4700 Insomnia, unspecified: Secondary | ICD-10-CM | POA: Diagnosis not present

## 2024-08-18 DIAGNOSIS — E042 Nontoxic multinodular goiter: Secondary | ICD-10-CM | POA: Diagnosis not present

## 2024-08-18 DIAGNOSIS — Z8673 Personal history of transient ischemic attack (TIA), and cerebral infarction without residual deficits: Secondary | ICD-10-CM | POA: Diagnosis not present

## 2024-08-18 DIAGNOSIS — Z556 Problems related to health literacy: Secondary | ICD-10-CM | POA: Diagnosis not present

## 2024-08-18 DIAGNOSIS — I252 Old myocardial infarction: Secondary | ICD-10-CM | POA: Diagnosis not present

## 2024-08-18 DIAGNOSIS — N1832 Chronic kidney disease, stage 3b: Secondary | ICD-10-CM | POA: Diagnosis not present

## 2024-08-18 DIAGNOSIS — K5900 Constipation, unspecified: Secondary | ICD-10-CM | POA: Diagnosis not present

## 2024-08-18 DIAGNOSIS — Z8614 Personal history of Methicillin resistant Staphylococcus aureus infection: Secondary | ICD-10-CM | POA: Diagnosis not present

## 2024-08-18 DIAGNOSIS — Z9181 History of falling: Secondary | ICD-10-CM | POA: Diagnosis not present

## 2024-08-18 DIAGNOSIS — E785 Hyperlipidemia, unspecified: Secondary | ICD-10-CM | POA: Diagnosis not present

## 2024-08-24 ENCOUNTER — Telehealth: Payer: Self-pay

## 2024-08-24 ENCOUNTER — Other Ambulatory Visit: Payer: Self-pay | Admitting: Family Medicine

## 2024-08-24 DIAGNOSIS — I10 Essential (primary) hypertension: Secondary | ICD-10-CM

## 2024-08-24 NOTE — Telephone Encounter (Signed)
 Copied from CRM [id number]. Topic: Clinical - Prescription Issue >> Aug 24, 2024 12:59 PM Avram MATSU wrote: Reason for CRM: orit is calling from [facility] and stated patient needs to be switched to a safer medication   hydrOXYzine  (VISTARIL ) 100 MG capsule [[id number]] meclizine  (ANTIVERT ) 12.5 MG tablet [[id number]]  [phone]

## 2024-08-26 DIAGNOSIS — Z9181 History of falling: Secondary | ICD-10-CM | POA: Diagnosis not present

## 2024-08-26 DIAGNOSIS — G4700 Insomnia, unspecified: Secondary | ICD-10-CM | POA: Diagnosis not present

## 2024-08-26 DIAGNOSIS — K5900 Constipation, unspecified: Secondary | ICD-10-CM | POA: Diagnosis not present

## 2024-08-26 DIAGNOSIS — M5417 Radiculopathy, lumbosacral region: Secondary | ICD-10-CM | POA: Diagnosis not present

## 2024-08-26 DIAGNOSIS — E785 Hyperlipidemia, unspecified: Secondary | ICD-10-CM | POA: Diagnosis not present

## 2024-08-26 DIAGNOSIS — I129 Hypertensive chronic kidney disease with stage 1 through stage 4 chronic kidney disease, or unspecified chronic kidney disease: Secondary | ICD-10-CM | POA: Diagnosis not present

## 2024-08-26 DIAGNOSIS — E042 Nontoxic multinodular goiter: Secondary | ICD-10-CM | POA: Diagnosis not present

## 2024-08-26 DIAGNOSIS — Z8614 Personal history of Methicillin resistant Staphylococcus aureus infection: Secondary | ICD-10-CM | POA: Diagnosis not present

## 2024-08-26 DIAGNOSIS — Z556 Problems related to health literacy: Secondary | ICD-10-CM | POA: Diagnosis not present

## 2024-08-26 DIAGNOSIS — N1832 Chronic kidney disease, stage 3b: Secondary | ICD-10-CM | POA: Diagnosis not present

## 2024-08-26 DIAGNOSIS — E1122 Type 2 diabetes mellitus with diabetic chronic kidney disease: Secondary | ICD-10-CM | POA: Diagnosis not present

## 2024-08-26 DIAGNOSIS — Z87891 Personal history of nicotine dependence: Secondary | ICD-10-CM | POA: Diagnosis not present

## 2024-08-26 DIAGNOSIS — D631 Anemia in chronic kidney disease: Secondary | ICD-10-CM | POA: Diagnosis not present

## 2024-08-26 DIAGNOSIS — Z8673 Personal history of transient ischemic attack (TIA), and cerebral infarction without residual deficits: Secondary | ICD-10-CM | POA: Diagnosis not present

## 2024-08-26 DIAGNOSIS — I252 Old myocardial infarction: Secondary | ICD-10-CM | POA: Diagnosis not present

## 2024-08-27 ENCOUNTER — Telehealth: Payer: Self-pay

## 2024-08-27 DIAGNOSIS — E785 Hyperlipidemia, unspecified: Secondary | ICD-10-CM | POA: Diagnosis not present

## 2024-08-27 DIAGNOSIS — Z87891 Personal history of nicotine dependence: Secondary | ICD-10-CM | POA: Diagnosis not present

## 2024-08-27 DIAGNOSIS — M1990 Unspecified osteoarthritis, unspecified site: Secondary | ICD-10-CM

## 2024-08-27 DIAGNOSIS — E1122 Type 2 diabetes mellitus with diabetic chronic kidney disease: Secondary | ICD-10-CM | POA: Diagnosis not present

## 2024-08-27 DIAGNOSIS — M5417 Radiculopathy, lumbosacral region: Secondary | ICD-10-CM | POA: Diagnosis not present

## 2024-08-27 DIAGNOSIS — E042 Nontoxic multinodular goiter: Secondary | ICD-10-CM | POA: Diagnosis not present

## 2024-08-27 DIAGNOSIS — Z8673 Personal history of transient ischemic attack (TIA), and cerebral infarction without residual deficits: Secondary | ICD-10-CM | POA: Diagnosis not present

## 2024-08-27 DIAGNOSIS — I252 Old myocardial infarction: Secondary | ICD-10-CM | POA: Diagnosis not present

## 2024-08-27 DIAGNOSIS — K5900 Constipation, unspecified: Secondary | ICD-10-CM | POA: Diagnosis not present

## 2024-08-27 DIAGNOSIS — M961 Postlaminectomy syndrome, not elsewhere classified: Secondary | ICD-10-CM

## 2024-08-27 DIAGNOSIS — Z9181 History of falling: Secondary | ICD-10-CM | POA: Diagnosis not present

## 2024-08-27 DIAGNOSIS — Z8614 Personal history of Methicillin resistant Staphylococcus aureus infection: Secondary | ICD-10-CM | POA: Diagnosis not present

## 2024-08-27 DIAGNOSIS — Z9989 Dependence on other enabling machines and devices: Secondary | ICD-10-CM

## 2024-08-27 DIAGNOSIS — R42 Dizziness and giddiness: Secondary | ICD-10-CM

## 2024-08-27 DIAGNOSIS — Z556 Problems related to health literacy: Secondary | ICD-10-CM | POA: Diagnosis not present

## 2024-08-27 DIAGNOSIS — I129 Hypertensive chronic kidney disease with stage 1 through stage 4 chronic kidney disease, or unspecified chronic kidney disease: Secondary | ICD-10-CM | POA: Diagnosis not present

## 2024-08-27 DIAGNOSIS — N1832 Chronic kidney disease, stage 3b: Secondary | ICD-10-CM | POA: Diagnosis not present

## 2024-08-27 DIAGNOSIS — G4700 Insomnia, unspecified: Secondary | ICD-10-CM | POA: Diagnosis not present

## 2024-08-27 NOTE — Telephone Encounter (Signed)
 Ok for verbal orders.   Order placed for 4 wheeled walker   Rockie Agent, MD  [facility]

## 2024-08-27 NOTE — Telephone Encounter (Signed)
 Called and LM for Mallie to call back to give verbal orders. Ok for E2C2 to give verbal orders.

## 2024-08-27 NOTE — Telephone Encounter (Signed)
 Copied from CRM [id number]. Topic: Clinical - Home Health Verbal Orders >> Aug 27, 2024 12:04 PM [name] wrote: Caller/Agency: Mallie Pizza, Physical Therapist with [facility] Callback Number: [phone] Service Requested: Physical Therapy Frequency: one visit for 1 week Any new concerns about the patient? Yes, requesting a four wheeled walker for patient

## 2024-08-31 ENCOUNTER — Other Ambulatory Visit: Payer: Self-pay | Admitting: Family Medicine

## 2024-08-31 DIAGNOSIS — K5900 Constipation, unspecified: Secondary | ICD-10-CM | POA: Diagnosis not present

## 2024-08-31 DIAGNOSIS — E042 Nontoxic multinodular goiter: Secondary | ICD-10-CM | POA: Diagnosis not present

## 2024-08-31 DIAGNOSIS — Z556 Problems related to health literacy: Secondary | ICD-10-CM | POA: Diagnosis not present

## 2024-08-31 DIAGNOSIS — E1122 Type 2 diabetes mellitus with diabetic chronic kidney disease: Secondary | ICD-10-CM | POA: Diagnosis not present

## 2024-08-31 DIAGNOSIS — Z8614 Personal history of Methicillin resistant Staphylococcus aureus infection: Secondary | ICD-10-CM | POA: Diagnosis not present

## 2024-08-31 DIAGNOSIS — I129 Hypertensive chronic kidney disease with stage 1 through stage 4 chronic kidney disease, or unspecified chronic kidney disease: Secondary | ICD-10-CM | POA: Diagnosis not present

## 2024-08-31 DIAGNOSIS — D631 Anemia in chronic kidney disease: Secondary | ICD-10-CM | POA: Diagnosis not present

## 2024-08-31 DIAGNOSIS — I252 Old myocardial infarction: Secondary | ICD-10-CM | POA: Diagnosis not present

## 2024-08-31 DIAGNOSIS — E785 Hyperlipidemia, unspecified: Secondary | ICD-10-CM | POA: Diagnosis not present

## 2024-08-31 DIAGNOSIS — Z9181 History of falling: Secondary | ICD-10-CM | POA: Diagnosis not present

## 2024-08-31 DIAGNOSIS — Z87891 Personal history of nicotine dependence: Secondary | ICD-10-CM | POA: Diagnosis not present

## 2024-08-31 DIAGNOSIS — I10 Essential (primary) hypertension: Secondary | ICD-10-CM

## 2024-08-31 DIAGNOSIS — N1832 Chronic kidney disease, stage 3b: Secondary | ICD-10-CM | POA: Diagnosis not present

## 2024-08-31 DIAGNOSIS — G4700 Insomnia, unspecified: Secondary | ICD-10-CM | POA: Diagnosis not present

## 2024-08-31 DIAGNOSIS — M5417 Radiculopathy, lumbosacral region: Secondary | ICD-10-CM | POA: Diagnosis not present

## 2024-08-31 DIAGNOSIS — Z8673 Personal history of transient ischemic attack (TIA), and cerebral infarction without residual deficits: Secondary | ICD-10-CM | POA: Diagnosis not present

## 2024-08-31 NOTE — Telephone Encounter (Unsigned)
 Copied from CRM [id number]. Topic: Clinical - Medication Refill >> Aug 31, 2024 12:40 PM [name] wrote: Medication: rosuvastatin  (CRESTOR ) 40 MG tablet  rivastigmine  (EXELON ) 3 MG capsule  hydrochlorothiazide  (HYDRODIURIL ) 25 MG tablet (Pt is unsure about this medication if she needs to be taking medication)  Has the patient contacted their pharmacy? Yes, advised to contact office  This is the patient's preferred pharmacy:  [id number] [place] [facility] - [address] [place] [address] Phone: [phone] Fax: [phone]    Is this the correct pharmacy for this prescription? Yes If no, delete pharmacy and type the correct one.   Has the prescription been filled recently? Yes  Is the patient out of the medication? Yes  Has the patient been seen for an appointment in the last year OR does the patient have an upcoming appointment? Yes  Can we respond through MyChart? No, prefers phone call at [phone]  Agent: Please be advised that Rx refills may take up to 3 business days. We ask that you follow-up with your pharmacy.

## 2024-08-31 NOTE — Telephone Encounter (Signed)
Ok for verbal orders.    Andreya Lacks Simmons-Robinson, MD  [facility]  

## 2024-08-31 NOTE — Telephone Encounter (Signed)
 Please advise if ok to approve verbal orders for PT 1xw/4wks.   Thank you!

## 2024-08-31 NOTE — Telephone Encounter (Signed)
 Copied from CRM [id number]. Topic: Clinical - Home Health Verbal Orders >> Aug 31, 2024  2:49 PM Myrick T wrote: Caller/Agency: Shawnee Pizza from [facility] Callback Number: [phone] Service Requested: Physical Therapy Frequency: 1x 4w Any new concerns about the patient? No

## 2024-09-01 NOTE — Telephone Encounter (Signed)
 Left voicemail for physical therapist Shawnee (secure VM) to advise that Dr. Lang approved orders.

## 2024-09-01 NOTE — Telephone Encounter (Signed)
 Requested medications are due for refill today.  unsure  Requested medications are on the active medications list.  yes  Last refill. varied  Future visit scheduled.   no  Notes to clinic.  Labs are expired for statin. Exelon  is showing as historical. Pt is unsure if she should be taking hydrochlorothiazide .    Requested Prescriptions  Pending Prescriptions Disp Refills   rosuvastatin  (CRESTOR ) 40 MG tablet 90 tablet 3    Sig: Take 1 tablet (40 mg total) by mouth daily.     Cardiovascular:  Antilipid - Statins 2 Failed - 09/01/2024  5:33 PM      Failed - Cr in normal range and within 360 days    Creat  Date Value Ref Range Status  10/30/2017 1.35 (H) 0.50 - 0.99 mg/dL Final    Comment:    For patients >76 years of age, the reference limit for Creatinine is approximately 13% higher for people identified as African-American. .    Creatinine, Ser  Date Value Ref Range Status  06/03/2024 1.46 (H) 0.57 - 1.00 mg/dL Final         Failed - Lipid Panel in normal range within the last 12 months    Cholesterol, Total  Date Value Ref Range Status  12/05/2020 163 100 - 199 mg/dL Final   Cholesterol  Date Value Ref Range Status  09/06/2021 134 0 - 200 mg/dL Final  97/97/7984 845 0 - 200 mg/dL Final   Ldl Cholesterol, Calc  Date Value Ref Range Status  01/18/2014 69 0 - 100 mg/dL Final   LDL Chol Calc (NIH)  Date Value Ref Range Status  12/05/2020 86 0 - 99 mg/dL Final   LDL Cholesterol  Date Value Ref Range Status  09/06/2021 63 0 - 99 mg/dL Final    Comment:           Total Cholesterol/HDL:CHD Risk Coronary Heart Disease Risk Table                     Men   Women  1/2 Average Risk   3.4   3.3  Average Risk       5.0   4.4  2 X Average Risk   9.6   7.1  3 X Average Risk  23.4   11.0        Use the calculated Patient Ratio above and the CHD Risk Table to determine the patient's CHD Risk.        ATP III CLASSIFICATION (LDL):  <100     mg/dL   Optimal  899-870   mg/dL   Near or Above                    Optimal  130-159  mg/dL   Borderline  839-810  mg/dL   High  >809     mg/dL   Very High Performed at [facility], [address] [place], [place] [address]    HDL Cholesterol  Date Value Ref Range Status  01/18/2014 74 (H) 40 - 60 mg/dL Final   HDL  Date Value Ref Range Status  09/06/2021 60 >40 mg/dL Final  87/79/7978 61 >60 mg/dL Final   Triglycerides  Date Value Ref Range Status  09/06/2021 54 <150 mg/dL Final  97/97/7984 53 0 - 200 mg/dL Final         Passed - Patient is not pregnant      Passed - Valid encounter within last 12 months  Recent Outpatient Visits           2 months ago Tremor   [place] [facility] [place], [place], MD   3 months ago Dysequilibrium   [place] [facility] [place], [place], MD   6 months ago Chronic diarrhea   [place] [facility] [place], [place] A, FNP   6 months ago Tender lymph node   [place] [facility] [place], [place], MD               rivastigmine  (EXELON ) 3 MG capsule      Sig: Take 1 capsule (3 mg total) by mouth 2 (two) times daily.     Neurology:  Alzheimer's Agents Passed - 09/01/2024  5:33 PM      Passed - Valid encounter within last 6 months    Recent Outpatient Visits           2 months ago Tremor   [place] [facility] Simmons-Robinson, [place], MD   3 months ago Dysequilibrium   [place] [facility] [place], [place], MD   6 months ago Chronic diarrhea   [place] [facility] [place], [place] A, FNP   6 months ago Tender lymph node   [place] [facility] Simmons-Robinson, [place], MD               hydrochlorothiazide  (HYDRODIURIL ) 25 MG tablet 60 tablet 1    Sig: Take 0.5 tablets (12.5 mg total) by mouth daily. Take one HALF  tablet by mouth once daily      Cardiovascular: Diuretics - Thiazide Failed - 09/01/2024  5:33 PM      Failed - Cr in normal range and within 180 days    Creat  Date Value Ref Range Status  10/30/2017 1.35 (H) 0.50 - 0.99 mg/dL Final    Comment:    For patients >14 years of age, the reference limit for Creatinine is approximately 13% higher for people identified as African-American. .    Creatinine, Ser  Date Value Ref Range Status  06/03/2024 1.46 (H) 0.57 - 1.00 mg/dL Final         Failed - Na in normal range and within 180 days    Sodium  Date Value Ref Range Status  06/03/2024 145 (H) 134 - 144 mmol/L Final  02/17/2014 140 136 - 145 mmol/L Final         Failed - Last BP in normal range    BP Readings from Last 1 Encounters:  07/02/24 (!) 145/78         Passed - K in normal range and within 180 days    Potassium  Date Value Ref Range Status  06/03/2024 3.7 3.5 - 5.2 mmol/L Final  02/17/2014 2.9 (L) 3.5 - 5.1 mmol/L Final         Passed - Valid encounter within last 6 months    Recent Outpatient Visits           2 months ago Tremor   [place] [facility] [place], [place], MD   3 months ago Dysequilibrium   [place] [facility] [place], [place], MD   6 months ago Chronic diarrhea   [place] [facility] [place], Curtis LABOR, FNP   6 months ago Tender lymph node   [place] [facility] [place], [place], MD

## 2024-09-02 MED ORDER — HYDROCHLOROTHIAZIDE 25 MG PO TABS: 12.5000 mg | ORAL_TABLET | Freq: Every day | ORAL | 2 refills | Status: AC

## 2024-09-02 MED ORDER — ROSUVASTATIN CALCIUM 40 MG PO TABS: 40.0000 mg | ORAL_TABLET | Freq: Every day | ORAL | 2 refills | Status: AC

## 2024-09-06 NOTE — Progress Notes (Unsigned)
 PROVIDER NOTE: Interpretation of information contained herein should be left to medically-trained personnel. Specific patient instructions are provided elsewhere under Patient Instructions section of medical record. This document was created in part using AI and STT-dictation technology, any transcriptional errors that may result from this process are unintentional.  Patient: Julie Acevedo  Service: E/M   PCP: Sharma Coyer, MD  DOB: 01/16/1951  DOS: 09/07/2024  Provider: Eric DELENA Como, MD  MRN: [id number]  Delivery: Face-to-face  Specialty: Interventional Pain Management  Type: Established Patient  Setting: Ambulatory outpatient facility  Specialty designation: 09  Referring Prov.: Simmons-Robinson, Makie*  Location: Outpatient office facility       History of present illness (HPI) [name], a 73 y.o. year old female, is here today because of her Chronic bilateral low back pain without sciatica [M54.50, G89.29]. Ms. Fetty primary complain today is No chief complaint on file.  Pertinent problems: Ms. Huffine has Arthritis; Narrowing of intervertebral disc space; Chronic low back pain of over 3 months duration; Lumbosacral radiculopathy at S1 (Left); Arthritis of knee, degenerative; Failed back surgical syndrome; Acquired trigger finger; Low back pain radiating to lower extremity (Right); PTTD (posterior tibial tendon dysfunction); Chronic pain syndrome; Abnormal CT scan, cervical spine (11/19/2022); Abnormal MRI, lumbar spine (09/25/2023); DDD (degenerative disc disease), cervical; DDD (degenerative disc disease), lumbar; Cervical foraminal stenosis (C3-C7); Chronic low back pain (1ry area of Pain) (Bilateral) (R>L) w/o sciatica; Chronic lower extremity pain (2ry area of Pain) (Right); Osteoarthritis of shoulder (Right); Osteoarthritis of knees (Bilateral); Chronic hip pain (3ry area of Pain) (Right); Lumbar facet joint pain; Lumbar facet joint syndrome; Arthropathy of hip  (Right); Osteoarthritis of hip (Right); 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  Pain Assessment: Severity of   is reported as a  /10. Location:    / . Onset:  . Quality:  . Timing:  . Modifying factor(s):  SABRA Vitals:  vitals were not taken for this visit.  BMI: Estimated body mass index is 23.38 kg/m as calculated from the following:   Height as of 08/03/24: 5' 3 (1.6 m).   Weight as of 08/03/24: 132 lb (59.9 kg).  Last encounter: 07/02/2024. Last procedure: 06/16/2024.  Reason for encounter: evaluation of worsening, or previously known (established) problem.   Discussed the use of AI scribe software for clinical note transcription with the patient, who gave verbal consent to proceed.  History of Present Illness           Pharmacotherapy Assessment   Opioid Analgesic(s):  No chronic opioid analgesics therapy prescribed by our practice. None MME/day: 0 mg/day   Monitoring: [place] PMP: PDMP reviewed during this encounter.       Pharmacotherapy: No side-effects or adverse reactions reported. Compliance: No problems identified. Effectiveness: Clinically acceptable.  No notes on file  UDS:  Summary  Date Value Ref Range Status  06/19/2023 Note  Final    Comment:    ==================================================================== Compliance Drug Analysis, Ur ==================================================================== Test                             Result  Flag       Units  Drug Present and Declared for Prescription  Verification   Gabapentin                      PRESENT      EXPECTED   Mirtazapine                     PRESENT      EXPECTED   Hydroxyzine                     PRESENT      EXPECTED   Metoprolol                      PRESENT      EXPECTED  Drug Absent but Declared for Prescription Verification   Levetiracetam                   Not Detected UNEXPECTED ==================================================================== Test                      Result    Flag   Units      Ref Range   Creatinine              60               mg/dL      >=79 ==================================================================== Declared Medications:  The flagging and interpretation on this report are based on the  following declared medications.  Unexpected results may arise from  inaccuracies in the declared medications.   **Note: The testing scope of this panel includes these medications:   Gabapentin  (Neurontin )  Hydroxyzine   Levetiracetam  (Keppra )  Metoprolol   Mirtazapine  (Remeron )   **Note: The testing scope of this panel does not include the  following reported medications:   Atorvastatin   Calcitriol   Diazoxide  (Proglycem )  Linaclotide  (Linzess )  Rosuvastatin  (Crestor )  Triamcinolone  (Kenalog ) ==================================================================== For clinical consultation, please call [phone]. ====================================================================     No results found for: CBDTHCR No results found for: D8THCCBX No results found for: D9THCCBX  ROS  Constitutional: Denies any fever or chills Gastrointestinal: No reported hemesis, hematochezia, vomiting, or acute GI distress Musculoskeletal: Denies any acute onset joint swelling, redness, loss of ROM, or weakness Neurological: No reported episodes of acute onset apraxia, aphasia, dysarthria, agnosia, amnesia, paralysis, loss of coordination, or loss of consciousness  Medication Review  Accu-Chek  Softclix Lancets, atorvastatin , blood glucose meter kit and supplies, calcitRIOL , diazoxide , diphenhydrAMINE , gabapentin , glucose blood, hydrOXYzine , hydrochlorothiazide , ibuprofen, levETIRAcetam , linaclotide , meclizine , metoprolol  succinate, mirtazapine , omeprazole , ondansetron , predniSONE , rivastigmine , rosuvastatin , and triamcinolone  cream  History Review  Allergy: Ms. Munoz is allergic to lotrel [amlodipine  besy-benazepril hcl], contrast media [iodinated contrast media], niacin, and sulfa antibiotics. Drug: Ms. Klinkner  reports no history of drug use. Alcohol:  reports no history of alcohol use. Tobacco:  reports that she has never smoked. She has never used smokeless tobacco. Social: Ms. Navedo  reports that she has never smoked. She has never used smokeless tobacco. She reports that she does not drink alcohol and does not use drugs. Medical:  has a past medical history of Anemia, Arthritis, COVID-19 (11/19/2019), Diverticulitis, DM (diabetes mellitus) (HCC), History of methicillin resistant staphylococcus aureus (MRSA) (2017), HTN (hypertension), Hyperlipidemia, L-S radiculopathy (06/11/2015), Left leg swelling (01/22/2023), Lower abdominal pain (08/08/2022), Memory difficulties (09/01/2015), Multiple thyroid  nodules, Myocardial infarction (HCC) (1995), Partial small bowel obstruction (HCC) (04/23/2016), SBO (small bowel obstruction) (HCC), Short-term memory loss, and Thyroid   nodule. Surgical: Ms. Volland  has a past surgical history that includes Hemicolectomy; Lumbar disc surgery; Roux-en-Y Gastric Bypass (2010); Cholecystectomy; Eye surgery; Hand surgery; Foot surgery; Abdominal hysterectomy; Tonsillectomy; Upper gi endoscopy (01/12/2016); Cardiac catheterization (2000); Laparoscopic lysis of adhesions (N/A, 08/21/2016); Ventral hernia repair (N/A, 08/21/2016); Insertion of mesh (N/A, 08/21/2016); Cystocele repair (N/A, 03/19/2017); Cystoscopy (N/A, 03/19/2017); Laparoscopic removal abdominal mass;  LOOP RECORDER INSERTION (N/A, 03/21/2018); laparotomy (N/A, 04/16/2019); XI robotic assisted ventral hernia (N/A, 12/08/2019); Insertion of mesh (N/A, 12/08/2019); Breast excisional biopsy (Right, 1971); Small intestine surgery; and Esophagogastroduodenoscopy (egd) with propofol  (N/A, 12/15/2021). Family: family history includes Breast cancer in her paternal grandmother; Breast cancer (age of onset: 60) in her paternal aunt; Breast cancer (age of onset: 35) in her paternal aunt; COPD in her mother; Cancer in her paternal aunt and paternal grandmother; Cancer (age of onset: 29) in her father; Congestive Heart Failure in her maternal grandmother; Coronary artery disease (age of onset: 55) in her father; Diabetes in her mother; Emphysema in her maternal grandfather; Heart attack in her paternal grandfather; Hypertension in her mother.  Laboratory Chemistry Profile   Renal Lab Results  Component Value Date   BUN 12 06/03/2024   CREATININE 1.46 (H) 06/03/2024   BCR 8 (L) 06/03/2024   GFRAA 42 (L) 12/05/2020   GFRNONAA 42 (L) 06/19/2023    Hepatic Lab Results  Component Value Date   AST 27 06/03/2024   ALT 19 06/03/2024   ALBUMIN 3.8 06/03/2024   ALKPHOS 174 (H) 06/03/2024   LIPASE 27 08/31/2022   AMMONIA 68 (H) 11/15/2012    Electrolytes Lab Results  Component Value Date   NA 145 (H) 06/03/2024   K 3.7 06/03/2024   CL 108 (H) 06/03/2024   CALCIUM  8.7 06/03/2024   MG 2.2 06/19/2023   PHOS 3.4 11/20/2022    Bone Lab Results  Component Value Date   VD25OH 51.23 08/09/2022   25OHVITD1 39 06/19/2023   25OHVITD2 1.0 06/19/2023   25OHVITD3 38 06/19/2023    Inflammation (CRP: Acute Phase) (ESR: Chronic Phase) Lab Results  Component Value Date   CRP 1 06/03/2024   ESRSEDRATE 13 06/03/2024   LATICACIDVEN 1.1 08/21/2022         Note: Above Lab results reviewed.  Recent Imaging Review  DG PAIN CLINIC C-ARM 1-60 MIN NO REPORT Fluoro was used, but no Radiologist interpretation will  be provided.  Please refer to NOTES tab for provider progress note. Note: Reviewed        Physical Exam  Vitals: There were no vitals taken for this visit. BMI: Estimated body mass index is 23.38 kg/m as calculated from the following:   Height as of 08/03/24: 5' 3 (1.6 m).   Weight as of 08/03/24: 132 lb (59.9 kg). Ideal: Patient weight not recorded General appearance: Well nourished, well developed, and well hydrated. In no apparent acute distress Mental status: Alert, oriented x 3 (person, place, & time)       Respiratory: No evidence of acute respiratory distress Eyes: PERLA   Assessment   Diagnosis Status  1. Chronic low back pain (1ry area of Pain) (Bilateral) (R>L) w/o sciatica   2. Lumbar facet joint pain   3. Lumbar facet joint syndrome   4. Chronic low back pain of over 3 months duration   5. 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 Low back pain radiating to lower extremity (Right)    Controlled Controlled Controlled   Updated Problems: No problems updated.  Plan of Care  Problem-specific:  Assessment and Plan            Ms. CHANE MAGNER has a current medication list which includes the following long-term medication(s): accu-chek guide, diphenhydramine , diphenhydramine , gabapentin , hydrochlorothiazide , levetiracetam , linaclotide , metoprolol  succinate, mirtazapine , omeprazole , and rosuvastatin .  Pharmacotherapy (Medications Ordered): No orders of the defined types were placed in this encounter.  Orders:  No orders of the defined types were placed in this encounter.    Interventional Therapies  Risk Factors  Considerations:  Cardiologist: Cara Lovelace, MD ([facility])  Neurologist: Jannett Fairly, MD [facility] neurology) Hx of MI (1995)  Hx of TIAs  T2DM  Stage 3b CKD  [name]  Memory impairment  Hx of Bowel Obstruction (04/2019)  GERD  s/p Gastric Bypass for obesity  Malnutrition-malabsorption  Hx.  Seizures      Planned  Pending:      Under consideration:   Therapeutic bilateral lumbar facet RFA #1    Completed:   Therapeutic bilateral lumbar facet MBB x2 (09/10/2023) (100/100/100/100)  Therapeutic left caudal ESI x2 (contrast allergy protocol ordered) (05/05/2024) (100/100/50/LE:100%LBP:50%)   Therapeutic  Palliative (PRN) options:   Therapeutic left caudal ESI (for lower extremity pain)  Therapeutic bilateral lumbar facet MBB (for low back pain)    Completed by other providers:   Therapeutic/diagnostic right L5 TFESI x2 (04/11/2016, 05/02/2016) by Morene Falcon, DO [facility])  Therapeutic/diagnostic right S1 TFESI x2 (04/11/2016, 05/02/2016) by Morene Falcon, DO [facility] PMR)  Therapeutic/diagnostic right L3 TFESI x1 (07/19/2016) by Morene Falcon, DO ([facility])      No follow-ups on file.    Recent Visits Date Type Provider Dept  07/02/24 Office Visit Tanya Glisson, MD [facility]  06/16/24 Procedure visit Tanya Glisson, MD [facility]  06/08/24 Office Visit Tanya Glisson, MD [facility]  Showing recent visits within past 90 days and meeting all other requirements Future Appointments Date Type Provider Dept  09/07/24 Appointment Tanya Glisson, MD [facility]  Showing future appointments within next 90 days and meeting all other requirements  I discussed the assessment and treatment plan with the patient. The patient was provided an opportunity to ask questions and all were answered. The patient agreed with the plan and demonstrated an understanding of the instructions.  Patient advised to call back or seek an in-person evaluation if the symptoms or condition worsens.  Duration of encounter: *** minutes.  Total time on encounter, as per AMA guidelines included both the face-to-face and non-face-to-face time personally spent by the physician and/or other qualified health care professional(s) on the day of the  encounter (includes time in activities that require the physician or other qualified health care professional and does not include time in activities normally performed by clinical staff). Physician's time may include the following activities when performed: Preparing to see the patient (e.g., pre-charting review of records, searching for previously ordered imaging, lab work, and nerve conduction tests) Review of prior analgesic pharmacotherapies. Reviewing PMP Interpreting ordered tests (e.g., lab work, imaging, nerve conduction tests) Performing post-procedure evaluations, including interpretation of diagnostic procedures Obtaining and/or reviewing separately obtained history Performing a medically appropriate examination and/or evaluation Counseling and educating the patient/family/caregiver Ordering medications, tests, or procedures Referring and communicating with other health care professionals (when not separately reported) Documenting clinical information in the electronic or other health record Independently interpreting results (not separately reported) and communicating results to the patient/ family/caregiver Care coordination (not separately reported)  Note by: Glisson LABOR  Tanya, MD (TTS and AI technology used. I apologize for any typographical errors that were not detected and corrected.) Date: 09/07/2024; Time: 4:21 PM

## 2024-09-07 ENCOUNTER — Ambulatory Visit (HOSPITAL_BASED_OUTPATIENT_CLINIC_OR_DEPARTMENT_OTHER): Admitting: Pain Medicine

## 2024-09-07 DIAGNOSIS — M79604 Pain in right leg: Secondary | ICD-10-CM

## 2024-09-07 DIAGNOSIS — M4316 Spondylolisthesis, lumbar region: Secondary | ICD-10-CM

## 2024-09-07 DIAGNOSIS — G8929 Other chronic pain: Secondary | ICD-10-CM

## 2024-09-07 DIAGNOSIS — M5459 Other low back pain: Secondary | ICD-10-CM

## 2024-09-07 DIAGNOSIS — Z91199 Patient's noncompliance with other medical treatment and regimen due to unspecified reason: Secondary | ICD-10-CM

## 2024-09-07 DIAGNOSIS — M47816 Spondylosis without myelopathy or radiculopathy, lumbar region: Secondary | ICD-10-CM

## 2024-09-07 DIAGNOSIS — M545 Low back pain, unspecified: Secondary | ICD-10-CM

## 2024-09-07 NOTE — Patient Instructions (Signed)
  ______________________________________________________________________    Appointment Information  It is our goal and responsibility to provide the medical community with assistance in the evaluation and management of patients with chronic pain. Unfortunately our resources are limited. Because we do not have an unlimited amount of time, or available appointments, we are required to closely monitor for unkept or cancelled appointments.  Patient's responsibilities: 1. Punctuality: Patients are required to be physically present in our office at least 15 minutes before their scheduled appointment. 2. Tardiness: Patients not physically present in our office at their scheduled appointment time will be rescheduled. 3. Plan ahead: Assume that you will encounter traffic and plan to arrive 30 minutes before your appointment. 4. Other appointments and responsibilities: Do not schedule other appointments immediately before or after your scheduled appointment.  5. Be prepared: Make a list of everything that you need to discuss with your provider so that you use your time efficiently. Once the provider leaves your room, he/she will not return to your room to discuss anything that you neglected to bring up during your allowed time. 6. No children or pets: Do not bring children or pets to your appointment. 7. Cancelling or rescheduling your appointment: Advanced notification (more than 24 hours in advance) is required. 8. No Show: Not calling to cancel an appointment and simply not showing up is unacceptable. This leads to loss of appointments that could have been used by a patient in need. (See below)  Corrective process for repeat offenders:  No Shows: Three (3) No Shows within a 12 month period will result in an automatic discharge from our practice. Rescheduling or cancelling with more than 24 hours notice will not be penalized and will not count against you. Tardiness: If you have to be rescheduled  three (3) times due to late arrivals, it will be counted as one (1) No Show. Cancellation or reschedule: Three (3) cancellations or rescheduling where notice was given with less than 24 hours in advance, will be recorded as one (1) No Show.  Types of appointments: New patient initial evaluation: These are evaluations only. Your initial patient questionnaire will be collected and entered into the system. A history of present illness will be taken. Prior lab work, imaging studies, and associated treatments will be reviewed. The provider may order appropriate diagnostic testing depending on their evaluation and review of available information. No treatments will be started on this visit. 2nd Follow-up visit: During this visit your provider will inform you of the results of the diagnostic tests ordered on the initial evaluation. Based on the providers assessment, treatment options will be offered, at which the patient will decide if he/she is interested in the alternatives. If interested, a treatment plan will be established and started. Procedure visits: Post-procedure evaluation visits: Evaluation visits MM New problems Flare-up evaluations Follow-up after diagnostic testing ______________________________________________________________________

## 2024-09-10 DIAGNOSIS — I129 Hypertensive chronic kidney disease with stage 1 through stage 4 chronic kidney disease, or unspecified chronic kidney disease: Secondary | ICD-10-CM | POA: Diagnosis not present

## 2024-09-10 DIAGNOSIS — M5417 Radiculopathy, lumbosacral region: Secondary | ICD-10-CM | POA: Diagnosis not present

## 2024-09-10 DIAGNOSIS — N1832 Chronic kidney disease, stage 3b: Secondary | ICD-10-CM | POA: Diagnosis not present

## 2024-09-10 DIAGNOSIS — D631 Anemia in chronic kidney disease: Secondary | ICD-10-CM | POA: Diagnosis not present

## 2024-09-10 DIAGNOSIS — G4700 Insomnia, unspecified: Secondary | ICD-10-CM | POA: Diagnosis not present

## 2024-09-10 DIAGNOSIS — F02A18 Dementia in other diseases classified elsewhere, mild, with other behavioral disturbance: Secondary | ICD-10-CM | POA: Diagnosis not present

## 2024-09-10 DIAGNOSIS — G40201 Localization-related (focal) (partial) symptomatic epilepsy and epileptic syndromes with complex partial seizures, not intractable, with status epilepticus: Secondary | ICD-10-CM | POA: Diagnosis not present

## 2024-09-10 DIAGNOSIS — I252 Old myocardial infarction: Secondary | ICD-10-CM | POA: Diagnosis not present

## 2024-09-10 DIAGNOSIS — E1122 Type 2 diabetes mellitus with diabetic chronic kidney disease: Secondary | ICD-10-CM | POA: Diagnosis not present

## 2024-09-10 DIAGNOSIS — E785 Hyperlipidemia, unspecified: Secondary | ICD-10-CM | POA: Diagnosis not present

## 2024-09-10 DIAGNOSIS — E042 Nontoxic multinodular goiter: Secondary | ICD-10-CM | POA: Diagnosis not present

## 2024-09-10 DIAGNOSIS — G3183 Dementia with Lewy bodies: Secondary | ICD-10-CM | POA: Diagnosis not present

## 2024-09-11 DIAGNOSIS — K5900 Constipation, unspecified: Secondary | ICD-10-CM | POA: Diagnosis not present

## 2024-09-11 DIAGNOSIS — E785 Hyperlipidemia, unspecified: Secondary | ICD-10-CM | POA: Diagnosis not present

## 2024-09-11 DIAGNOSIS — Z556 Problems related to health literacy: Secondary | ICD-10-CM | POA: Diagnosis not present

## 2024-09-11 DIAGNOSIS — Z8614 Personal history of Methicillin resistant Staphylococcus aureus infection: Secondary | ICD-10-CM | POA: Diagnosis not present

## 2024-09-11 DIAGNOSIS — E042 Nontoxic multinodular goiter: Secondary | ICD-10-CM | POA: Diagnosis not present

## 2024-09-11 DIAGNOSIS — Z8673 Personal history of transient ischemic attack (TIA), and cerebral infarction without residual deficits: Secondary | ICD-10-CM | POA: Diagnosis not present

## 2024-09-11 DIAGNOSIS — E1122 Type 2 diabetes mellitus with diabetic chronic kidney disease: Secondary | ICD-10-CM | POA: Diagnosis not present

## 2024-09-11 DIAGNOSIS — D631 Anemia in chronic kidney disease: Secondary | ICD-10-CM | POA: Diagnosis not present

## 2024-09-11 DIAGNOSIS — I129 Hypertensive chronic kidney disease with stage 1 through stage 4 chronic kidney disease, or unspecified chronic kidney disease: Secondary | ICD-10-CM | POA: Diagnosis not present

## 2024-09-11 DIAGNOSIS — G4700 Insomnia, unspecified: Secondary | ICD-10-CM | POA: Diagnosis not present

## 2024-09-11 DIAGNOSIS — Z9181 History of falling: Secondary | ICD-10-CM | POA: Diagnosis not present

## 2024-09-11 DIAGNOSIS — Z87891 Personal history of nicotine dependence: Secondary | ICD-10-CM | POA: Diagnosis not present

## 2024-09-11 DIAGNOSIS — M5417 Radiculopathy, lumbosacral region: Secondary | ICD-10-CM | POA: Diagnosis not present

## 2024-09-11 DIAGNOSIS — I252 Old myocardial infarction: Secondary | ICD-10-CM | POA: Diagnosis not present

## 2024-09-11 DIAGNOSIS — N1832 Chronic kidney disease, stage 3b: Secondary | ICD-10-CM | POA: Diagnosis not present

## 2024-09-21 DIAGNOSIS — Z556 Problems related to health literacy: Secondary | ICD-10-CM | POA: Diagnosis not present

## 2024-09-21 DIAGNOSIS — E785 Hyperlipidemia, unspecified: Secondary | ICD-10-CM | POA: Diagnosis not present

## 2024-09-21 DIAGNOSIS — I129 Hypertensive chronic kidney disease with stage 1 through stage 4 chronic kidney disease, or unspecified chronic kidney disease: Secondary | ICD-10-CM | POA: Diagnosis not present

## 2024-09-21 DIAGNOSIS — Z8673 Personal history of transient ischemic attack (TIA), and cerebral infarction without residual deficits: Secondary | ICD-10-CM | POA: Diagnosis not present

## 2024-09-21 DIAGNOSIS — I252 Old myocardial infarction: Secondary | ICD-10-CM | POA: Diagnosis not present

## 2024-09-21 DIAGNOSIS — E1122 Type 2 diabetes mellitus with diabetic chronic kidney disease: Secondary | ICD-10-CM | POA: Diagnosis not present

## 2024-09-21 DIAGNOSIS — E042 Nontoxic multinodular goiter: Secondary | ICD-10-CM | POA: Diagnosis not present

## 2024-09-21 DIAGNOSIS — K5900 Constipation, unspecified: Secondary | ICD-10-CM | POA: Diagnosis not present

## 2024-09-21 DIAGNOSIS — N1832 Chronic kidney disease, stage 3b: Secondary | ICD-10-CM | POA: Diagnosis not present

## 2024-09-21 DIAGNOSIS — Z8614 Personal history of Methicillin resistant Staphylococcus aureus infection: Secondary | ICD-10-CM | POA: Diagnosis not present

## 2024-09-21 DIAGNOSIS — G4700 Insomnia, unspecified: Secondary | ICD-10-CM | POA: Diagnosis not present

## 2024-09-21 DIAGNOSIS — Z87891 Personal history of nicotine dependence: Secondary | ICD-10-CM | POA: Diagnosis not present

## 2024-09-21 DIAGNOSIS — Z9181 History of falling: Secondary | ICD-10-CM | POA: Diagnosis not present

## 2024-09-21 DIAGNOSIS — M5417 Radiculopathy, lumbosacral region: Secondary | ICD-10-CM | POA: Diagnosis not present

## 2024-09-21 DIAGNOSIS — D631 Anemia in chronic kidney disease: Secondary | ICD-10-CM | POA: Diagnosis not present

## 2024-09-22 ENCOUNTER — Other Ambulatory Visit: Payer: Self-pay | Admitting: Physician Assistant

## 2024-09-28 ENCOUNTER — Ambulatory Visit

## 2024-09-28 DIAGNOSIS — L814 Other melanin hyperpigmentation: Secondary | ICD-10-CM | POA: Diagnosis not present

## 2024-09-28 DIAGNOSIS — L821 Other seborrheic keratosis: Secondary | ICD-10-CM

## 2024-09-28 NOTE — Progress Notes (Signed)
    Subjective   Julie Acevedo is a 73 y.o. female who presents for the following: Rash. Patient is established patient   Today patient reports: Area of concern on bilateral ears.   Review of Systems:    No other skin or systemic complaints except as noted in HPI or Assessment and Plan.  The following portions of the chart were reviewed this encounter and updated as appropriate: medications, allergies, medical history  Relevant Medical History:  n/a   Objective  Well appearing patient in no apparent distress; mood and affect are within normal limits. Examination was performed of the: Bilateral ears Examination notable for: Lentigo/lentigines: Scattered pigmented macules that are tan to brown in color and are somewhat non-uniform in shape and concentrated in the sun-exposed areas, Seborrheic Keratosis(es): Stuck-on appearing keratotic papule(s) on the trunk, none  irritated with redness, crusting, edema, and/or partial avulsion  Examination limited by: Clothing and Patient deferred removal       Assessment & Plan   BENIGN SKIN FINDINGS  - Lentigines at bilateral ears   - Seborrheic keratosis  - Reassurance provided regarding the benign appearance of lesions noted on exam today; no treatment is indicated in the absence of symptoms/changes. - Reinforced importance of photoprotective strategies including liberal and frequent sunscreen use of a broad-spectrum SPF 30 or greater, use of protective clothing, and sun avoidance for prevention of cutaneous malignancy and photoaging.  Counseled patient on the importance of regular self-skin monitoring as well as routine clinical skin examinations as scheduled.     Level of service outlined above   Procedures, orders, diagnosis for this visit:    There are no diagnoses linked to this encounter.  Return to clinic: Return if symptoms worsen or fail to improve.  I, Emerick Ege, CMA am acting as scribe for Lauraine JAYSON Kanaris,  MD.   Documentation: I have reviewed the above documentation for accuracy and completeness, and I agree with the above.  Lauraine JAYSON Kanaris, MD

## 2024-09-28 NOTE — Patient Instructions (Signed)

## 2024-09-30 DIAGNOSIS — E785 Hyperlipidemia, unspecified: Secondary | ICD-10-CM | POA: Diagnosis not present

## 2024-09-30 DIAGNOSIS — Z8614 Personal history of Methicillin resistant Staphylococcus aureus infection: Secondary | ICD-10-CM | POA: Diagnosis not present

## 2024-09-30 DIAGNOSIS — I129 Hypertensive chronic kidney disease with stage 1 through stage 4 chronic kidney disease, or unspecified chronic kidney disease: Secondary | ICD-10-CM | POA: Diagnosis not present

## 2024-09-30 DIAGNOSIS — Z9181 History of falling: Secondary | ICD-10-CM | POA: Diagnosis not present

## 2024-09-30 DIAGNOSIS — Z556 Problems related to health literacy: Secondary | ICD-10-CM | POA: Diagnosis not present

## 2024-09-30 DIAGNOSIS — E1122 Type 2 diabetes mellitus with diabetic chronic kidney disease: Secondary | ICD-10-CM | POA: Diagnosis not present

## 2024-09-30 DIAGNOSIS — M5417 Radiculopathy, lumbosacral region: Secondary | ICD-10-CM | POA: Diagnosis not present

## 2024-09-30 DIAGNOSIS — Z8673 Personal history of transient ischemic attack (TIA), and cerebral infarction without residual deficits: Secondary | ICD-10-CM | POA: Diagnosis not present

## 2024-09-30 DIAGNOSIS — Z87891 Personal history of nicotine dependence: Secondary | ICD-10-CM | POA: Diagnosis not present

## 2024-09-30 DIAGNOSIS — N1832 Chronic kidney disease, stage 3b: Secondary | ICD-10-CM | POA: Diagnosis not present

## 2024-09-30 DIAGNOSIS — I252 Old myocardial infarction: Secondary | ICD-10-CM | POA: Diagnosis not present

## 2024-09-30 DIAGNOSIS — E042 Nontoxic multinodular goiter: Secondary | ICD-10-CM | POA: Diagnosis not present

## 2024-09-30 DIAGNOSIS — D631 Anemia in chronic kidney disease: Secondary | ICD-10-CM | POA: Diagnosis not present

## 2024-09-30 DIAGNOSIS — K5900 Constipation, unspecified: Secondary | ICD-10-CM | POA: Diagnosis not present

## 2024-09-30 DIAGNOSIS — G4700 Insomnia, unspecified: Secondary | ICD-10-CM | POA: Diagnosis not present

## 2024-10-07 ENCOUNTER — Ambulatory Visit (INDEPENDENT_AMBULATORY_CARE_PROVIDER_SITE_OTHER): Payer: Self-pay | Admitting: Podiatry

## 2024-10-07 DIAGNOSIS — Z91199 Patient's noncompliance with other medical treatment and regimen due to unspecified reason: Secondary | ICD-10-CM

## 2024-10-08 NOTE — Progress Notes (Signed)
 Patient was no-show for appointment today

## 2024-10-08 NOTE — Progress Notes (Signed)
 Department: [facility]) Date: 10/12/2024  Event: Canceled/Rescheduled by patient.  Encounter Type: Patient-requested evaluation.          Advance notice: Less than 24 hr notice.  Reason: Sickness.          Note: Patient will R/S.

## 2024-10-12 ENCOUNTER — Ambulatory Visit (HOSPITAL_BASED_OUTPATIENT_CLINIC_OR_DEPARTMENT_OTHER): Admitting: Pain Medicine

## 2024-10-12 DIAGNOSIS — Z91199 Patient's noncompliance with other medical treatment and regimen due to unspecified reason: Secondary | ICD-10-CM

## 2024-10-12 DIAGNOSIS — G8929 Other chronic pain: Secondary | ICD-10-CM

## 2024-10-21 ENCOUNTER — Ambulatory Visit: Admitting: Family Medicine

## 2024-10-21 ENCOUNTER — Other Ambulatory Visit: Payer: Self-pay | Admitting: Family Medicine

## 2024-10-28 NOTE — Progress Notes (Signed)
 Julie Acevedo                                          MRN: [id number]   10/28/2024   The [facility] Quality Team Specialist reviewed this patient medical record for the purposes of chart review for care gap closure. The following were reviewed: chart review for care gap closure-glycemic status assessment.    [facility]BCI Quality Team

## 2024-11-02 NOTE — Progress Notes (Signed)
 Julie Acevedo                                          MRN: [id number]   11/02/2024   The [facility]BCI Quality Team Specialist reviewed this patient medical record for the purposes of chart review for care gap closure. The following were reviewed: chart review for care gap closure-glycemic status assessment.    [facility]BCI Quality Team

## 2024-11-24 NOTE — Progress Notes (Signed)
 Julie Acevedo                                          MRN: [id number]   11/24/2024   The [facility]BCI Quality Team Specialist reviewed this patient medical record for the purposes of chart review for care gap closure. The following were reviewed: chart review for care gap closure-glycemic status assessment.    [facility]BCI Quality Team

## 2024-11-26 ENCOUNTER — Ambulatory Visit: Payer: Self-pay

## 2024-11-26 NOTE — Telephone Encounter (Signed)
 FYI Only or Action Required?: FYI only for provider: ED advised.  Patient was last seen in primary care on 06/26/2024 by Sharma Coyer, MD.  Called Nurse Triage reporting Leg Swelling and Chest pain.  Symptoms began leg swelling since Sunday, neck symptoms for several weeks to months and patient did not state how long chest pain has been occurring.  Interventions attempted: Nothing.  Symptoms are: gradually worsening.  Triage Disposition: Go to ED Now (Notify PCP)  Patient/caregiver understands and will follow disposition?: Yes             Copied from CRM [id number]. Topic: Clinical - Red Word Triage >> Nov 26, 2024  2:06 PM Selinda RAMAN wrote: Red Word that prompted transfer to Nurse Triage: The patient called in stating she has has a bad know on the left side of her head near her neck and it really hurts her to turn her head. She also complains of really bad left leg swelling so much so she cannot even put panty hose on. I will transfer her to E2C2 NT Reason for Disposition  [1] Chest pain (or angina) comes and goes AND [2] is happening more often (increasing in frequency) or getting worse (increasing in severity)  (Exception: Chest pains that last only a few seconds.)  Answer Assessment - Initial Assessment Questions 1. LOCATION: Where does it hurt?       Left side of chest discomfort. Patient states she has a hard time explaining the chest pain. She states she thinks it is due to her loop recorder. She states the pain comes and goes and lasts longer than 5 minutes. It is not present now. She states it has been occurring more frequently. Left leg swelling up to thigh since Sunday and no swelling to right leg. History of DVT and heart attack. Patient denies SOB.  Patient also complains of left side of neck/throat/head swelling and painful, difficult to turn her head.   RN advised ED, patient became tearful. RN educated patient on her symptoms and need for emergency  care and testing. Patient is agreeable.  Protocols used: Chest Pain-A-AH

## 2024-11-27 ENCOUNTER — Telehealth: Payer: Self-pay

## 2024-11-27 NOTE — Telephone Encounter (Signed)
 Julie Acevedo

## 2024-11-27 NOTE — Telephone Encounter (Signed)
 Agree with ED recommendation for evaluation ASAP

## 2024-12-01 ENCOUNTER — Emergency Department
Admission: EM | Admit: 2024-12-01 | Discharge: 2024-12-01 | Disposition: A | Discharge status: Home / Self Care | Attending: Emergency Medicine | Admitting: Emergency Medicine

## 2024-12-01 ENCOUNTER — Emergency Department

## 2024-12-01 ENCOUNTER — Other Ambulatory Visit: Payer: Self-pay

## 2024-12-01 DIAGNOSIS — N189 Chronic kidney disease, unspecified: Secondary | ICD-10-CM | POA: Diagnosis not present

## 2024-12-01 DIAGNOSIS — I129 Hypertensive chronic kidney disease with stage 1 through stage 4 chronic kidney disease, or unspecified chronic kidney disease: Secondary | ICD-10-CM | POA: Diagnosis not present

## 2024-12-01 DIAGNOSIS — E1122 Type 2 diabetes mellitus with diabetic chronic kidney disease: Secondary | ICD-10-CM | POA: Diagnosis not present

## 2024-12-01 DIAGNOSIS — M79662 Pain in left lower leg: Secondary | ICD-10-CM | POA: Diagnosis not present

## 2024-12-01 DIAGNOSIS — G3183 Dementia with Lewy bodies: Secondary | ICD-10-CM | POA: Diagnosis not present

## 2024-12-01 DIAGNOSIS — R591 Generalized enlarged lymph nodes: Secondary | ICD-10-CM

## 2024-12-01 DIAGNOSIS — R590 Localized enlarged lymph nodes: Secondary | ICD-10-CM | POA: Diagnosis not present

## 2024-12-01 DIAGNOSIS — M79605 Pain in left leg: Secondary | ICD-10-CM | POA: Diagnosis present

## 2024-12-01 NOTE — ED Notes (Signed)
 Patient's caregiver was informed of the patient's pending discharge. Patient's caregiver gave the ok for the patient to wait in the lobby for her ride.

## 2024-12-01 NOTE — ED Triage Notes (Signed)
 Left leg swelling and knot to left neck since Friday.

## 2024-12-01 NOTE — Discharge Instructions (Addendum)
 Your CT scan of the neck and leg ultrasound did not show any acute issues today. The CT did show a thyroid  nodule, which you should follow up with your primary care doctor about.  1. Within the limitations of noncontrast technique, there is no evidence of  neck abscess or discrete mass.  2. 1.9 cm nodule in the posterior right thyroid  lobe, for which non-emergent  thyroid  ultrasound is recommended.

## 2024-12-01 NOTE — ED Provider Notes (Signed)
 [facility] Provider Note    Event Date/Time   First MD Initiated Contact with Patient 12/01/24 1404     (approximate)   History   Chief Complaint Neck Pain and Leg Pain   HPI  Julie Acevedo is a 73 y.o. female with past medical history of hypertension, diabetes, CKD, seizures, chronic pain syndrome, and [name] who presents to the ED complaining of neck pain and leg pain.  Patient reports that she has had a swollen area on the left side of her neck, just under her jaw, for the past few months.  She states that it has gotten more swollen and tender to touch over the past few days, making it difficult for her to sleep last night.  She denies any fevers or sore throat, has not had any pain or swelling inside of her mouth.  She states that she previously had an ultrasound of this area that was unremarkable.  She also reports that she has had increased pain and swelling in her left calf over the past week.  She denies any trauma to this area and has not noticed any redness or skin changes.     Physical Exam   Triage Vital Signs: ED Triage Vitals  Encounter Vitals Group     BP 12/01/24 1325 (!) 184/72     Girls Systolic BP Percentile --      Girls Diastolic BP Percentile --      Boys Systolic BP Percentile --      Boys Diastolic BP Percentile --      Pulse Rate 12/01/24 1325 68     Resp 12/01/24 1325 16     Temp 12/01/24 1325 98.2 F (36.8 C)     Temp Source 12/01/24 1325 Oral     SpO2 12/01/24 1325 97 %     Weight 12/01/24 1324 132 lb 0.9 oz (59.9 kg)     Height --      Head Circumference --      Peak Flow --      Pain Score 12/01/24 1323 8     Pain Loc --      Pain Education --      Exclude from Growth Chart --     Most recent vital signs: Vitals:   12/01/24 1325  BP: (!) 184/72  Pulse: 68  Resp: 16  Temp: 98.2 F (36.8 C)  SpO2: 97%    Constitutional: Alert and oriented. Eyes: Conjunctivae are normal. Head:  Atraumatic. Neck: Tender nodule underneath the left mandible with no overlying erythema or warmth and no fluctuance noted. Nose: No congestion/rhinnorhea. Mouth/Throat: Mucous membranes are moist.  Cardiovascular: Normal rate, regular rhythm. Grossly normal heart sounds.  2+ radial and DP pulses bilaterally. Respiratory: Normal respiratory effort.  No retractions. Lungs CTAB. Gastrointestinal: Soft and nontender. No distention. Musculoskeletal: Trace pitting edema and tenderness noted to the left calf, no tenderness to palpation of the left knee or ankle and range of motion intact.  No overlying erythema or warmth noted. Neurologic:  Normal speech and language. No gross focal neurologic deficits are appreciated.    ED Results / Procedures / Treatments   Labs (all labs ordered are listed, but only abnormal results are displayed) Labs Reviewed  COMPREHENSIVE METABOLIC PANEL WITH GFR  CBC WITH DIFFERENTIAL/PLATELET    PROCEDURES:  Critical Care performed: No  Procedures   MEDICATIONS ORDERED IN ED: Medications - No data to display   IMPRESSION / MDM / ASSESSMENT  AND PLAN / ED COURSE  I reviewed the triage vital signs and the nursing notes.                              73 y.o. female with past medical history of hypertension, diabetes, CKD, seizures, chronic pain syndrome, and [name] who presents to the ED complaining of 1 week of pain and swelling in her left lower leg as well as increased pain around nodule underneath her left jaw.  Patient's presentation is most consistent with acute presentation with potential threat to life or bodily function.  Differential diagnosis includes, but is not limited to, DVT, arterial insufficiency, venous insufficiency, Strain, arthritis, sialoadenitis, abscess, cellulitis, lymphadenopathy.  Patient nontoxic-appearing and in no acute distress, vital signs are unremarkable.  She is neurovascular intact to her left lower extremity  with strong DP pulse and no evidence of infectious process.  We will check ultrasound to assess for DVT, no findings concerning for bony process affecting the left lower extremity.  We will also check CT imaging of the soft tissue's of the neck, but no obvious signs of infection on exam.  She did have ultrasound of this area in January of this year that was consistent with enlarged but nonpathologic lymph node.  Patient declines pain medication, lab results are pending.  Patient turned over to oncoming provider pending lab and imaging results.      FINAL CLINICAL IMPRESSION(S) / ED DIAGNOSES   Final diagnoses:  Left leg pain  Lymphadenopathy     Rx / DC Orders   ED Discharge Orders     None        Note:  This document was prepared using Dragon voice recognition software and may include unintentional dictation errors.   Willo Dunnings, MD 12/01/24 [phone]

## 2024-12-22 ENCOUNTER — Encounter: Payer: Self-pay | Admitting: *Deleted

## 2024-12-22 NOTE — Progress Notes (Signed)
 Julie Acevedo                                          MRN: [id number]   12/22/2024   The [facility]BCI Quality Team Specialist reviewed this patient medical record for the purposes of chart review for care gap closure. The following were reviewed: chart review for care gap closure-glycemic status assessment.    VBCI Quality Team

## 2024-12-29 ENCOUNTER — Encounter: Payer: Self-pay | Admitting: Family Medicine

## 2024-12-29 ENCOUNTER — Ambulatory Visit (INDEPENDENT_AMBULATORY_CARE_PROVIDER_SITE_OTHER): Admitting: Family Medicine

## 2024-12-29 VITALS — BP 149/66 | HR 65 | Temp 98.2°F | Ht 63.0 in | Wt 126.8 lb

## 2024-12-29 DIAGNOSIS — R600 Localized edema: Secondary | ICD-10-CM | POA: Diagnosis not present

## 2024-12-29 DIAGNOSIS — K30 Functional dyspepsia: Secondary | ICD-10-CM

## 2024-12-29 DIAGNOSIS — I10 Essential (primary) hypertension: Secondary | ICD-10-CM

## 2024-12-29 DIAGNOSIS — N898 Other specified noninflammatory disorders of vagina: Secondary | ICD-10-CM | POA: Diagnosis not present

## 2024-12-29 DIAGNOSIS — I809 Phlebitis and thrombophlebitis of unspecified site: Secondary | ICD-10-CM

## 2024-12-29 NOTE — Progress Notes (Signed)
 "  Acute Office Visit  Patient ID: Julie Acevedo, female    DOB: [birth year], 74 y.o.   MRN: [id number]  PCP: Sharma Coyer, MD  Chief Complaint  Patient presents with   Acute Visit    Left leg swelling and some pain when putting on pantyhose x 6 months. Used to swell then go down now stays swollen     Subjective:     HPI  Discussed the use of AI scribe software for clinical note transcription with the patient, who gave verbal consent to proceed.  History of Present Illness Julie Acevedo is a 74 year old female who presents with persistent left leg pain and swelling.  She has experienced left leg pain and swelling for at least six months. Initially, the swelling fluctuated during the day but now remains constant, causing discomfort particularly around the knee and making it difficult to wear pantyhose. An ultrasound during an emergency department visit on December 01, 2024, did not show deep vein thrombosis. The report mentioned superficial thrombophlebitis of the left short saphenous vein. She has not had surgery on the left leg and denies recent falls.  She experiences episodes of heart palpitations, describing them as her heart 'fluttering' and needing to sit down due to weakness. These episodes are brief and have not been associated with any recent cardiology follow-up, although she is under the care of a cardiologist in [place]. She had a coronary CT in February 2025 and is due for a cardiology appointment this year.  She mentions a 'knot' on her rib and reports new-onset indigestion, which she has never experienced before.  She requests suppositories for lubrication, noting that over-the-counter options are either too watery or too greasy. She previously received a prescription for a $200 suppository, which she found too expensive.  Family history reveals that her maternal aunt had significant leg swelling before she passed away, which was persistent and  severe.   ROS     Objective:    BP (!) 149/66   Pulse 65   Temp 98.2 F (36.8 C) (Oral)   Ht 5' 3 (1.6 m)   Wt 126 lb 12.8 oz (57.5 kg)   SpO2 100%   BMI 22.46 kg/m   BP Readings from Last 3 Encounters:  12/29/24 (!) 149/66  12/01/24 (!) 131/114  07/02/24 (!) 145/78   Wt Readings from Last 3 Encounters:  12/29/24 126 lb 12.8 oz (57.5 kg)  12/01/24 132 lb 0.9 oz (59.9 kg)  08/03/24 132 lb (59.9 kg)      Physical Exam Vitals reviewed.  Constitutional:      General: She is not in acute distress.    Appearance: Normal appearance. She is not ill-appearing.  Pulmonary:     Effort: Pulmonary effort is normal. No respiratory distress.  Musculoskeletal:     Left lower leg: Tenderness present. No deformity. Edema present.     Comments: There is no erythema, LE is not warm to touch on left   Neurological:     Mental Status: She is alert and oriented to person, place, and time.  Psychiatric:        Mood and Affect: Mood normal.        Behavior: Behavior normal.        Thought Content: Thought content normal.       No results found for any visits on 12/29/24.     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 No evidence of DVT on recent ultrasound. Symptoms have persisted for at least six months with recent exacerbation. Differential includes chronic inflammation around the vessels. No recent trauma or surgery reported. - Referred to vascular surgery for further  evaluation and management. - Recommended wearing compression stockings during the day as much as possible.  Essential hypertension Chronic  Blood pressure is elevated,improved on repeat. No specific discussion of current management or medication adjustments in this encounter. - Referred to cardiologist for evaluation and management of hypertension. -continue metoprolol  25mg  daily, hydrochlorothiazide  12.5mg  daily   Palpitations Chronic,intermittent Intermittent episodes of heart racing and weakness, requiring her to sit down. Symptoms are not prolonged and are described as irritation. She is under the care of a cardiologist and has a coronary CT from last February. - Referred to cardiologist for evaluation of palpitations and heart rate issues. - called and pt scheduled for 01/22/25  Functional dyspepsia New onset of indigestion with no prior history. Symptoms include indigestion without constipation. - will have her resume omeprazole  20mg  daily PRN   Postmenopausal atrophic vaginitis Request for lubrication suppositories for intercourse. Over-the-counter options are too watery and expensive. Previous prescription was cost-prohibitive. - Will investigate cost-effective options for lubrication suppositories for intercourse.    No orders of the defined types were placed in this encounter.   No follow-ups on file.  Rockie Agent, MD [facility]   "

## 2024-12-30 ENCOUNTER — Ambulatory Visit (INDEPENDENT_AMBULATORY_CARE_PROVIDER_SITE_OTHER)

## 2024-12-30 ENCOUNTER — Ambulatory Visit: Admitting: Podiatry

## 2024-12-30 VITALS — Ht 63.0 in | Wt 126.0 lb

## 2024-12-30 DIAGNOSIS — B351 Tinea unguium: Secondary | ICD-10-CM | POA: Diagnosis not present

## 2024-12-30 DIAGNOSIS — M65972 Unspecified synovitis and tenosynovitis, left ankle and foot: Secondary | ICD-10-CM | POA: Diagnosis not present

## 2024-12-30 DIAGNOSIS — M79675 Pain in left toe(s): Secondary | ICD-10-CM

## 2024-12-30 DIAGNOSIS — M79674 Pain in right toe(s): Secondary | ICD-10-CM | POA: Diagnosis not present

## 2024-12-30 DIAGNOSIS — M2012 Hallux valgus (acquired), left foot: Secondary | ICD-10-CM

## 2024-12-30 NOTE — Progress Notes (Signed)
"  °  Subjective:  Patient ID: Julie Acevedo, female    DOB: [birth year],  MRN: [id number]  Chief Complaint  Patient presents with   Nail Problem    RM 1 DFC. Pt states some pain in the left bunion area ( callus).    74 y.o. female presents with the above complaint. History confirmed with patient.  She has a history of previous bunionectomy with an implant has begun getting more pain around the joint recently.  Nails are thickened elongated causing pain in shoe gear.  Objective:  Physical Exam: warm, good capillary refill, no trophic changes or ulcerative lesions, normal DP and PT pulses, and normal sensory exam. Left Foot: dystrophic yellowed discolored nail plates with subungual debris and pain swelling and palpable bony prominence around the first metatarsal head, there is no cellulitis erythema or heat or major effusion Right Foot: dystrophic yellowed discolored nail plates with subungual debris  No images are attached to the encounter.  Radiographs: Multiple views x-ray of the left foot: no fracture, dislocation, swelling or degenerative changes noted and no soft tissue emphysema, no signs of osteomyelitis, implant stable and in previous position, there is hypertrophic bone formation around the implant. Assessment:   1. Synovitis of left foot   2. Pain due to onychomycosis of toenails of both feet      Plan:  Patient was evaluated and treated and all questions answered.  Discussed the etiology and treatment options for the condition in detail with the patient. Recommended debridement of the nails today. Sharp and mechanical debridement performed of all painful and mycotic nails today. Nails debrided in length and thickness using a nail nipper to level of comfort. Follow up as needed for painful nails.   Reviewed her x-rays taken today and discussed the presence of the implant and hypertrophic bone formation surrounding this.  She was acutely inflamed today there were no signs of  infection either clinically or radiographically, suspect likely she is getting synovitis and inflammation from the prominence of the hypertrophic bone formation.  We discussed the risks and benefits including the risk of infection.  Following consent the first MTP joint medially was prepped with Betadine and 4 mg of dexamethasone  and 2.5 mg of Marcaine  were injected into the first MTPJ and along the medial capsule.  This was tolerated well.  No follow-ups on file.   "

## 2025-01-03 NOTE — Progress Notes (Unsigned)
 PROVIDER NOTE: Interpretation of information contained herein should be left to medically-trained personnel. Specific patient instructions are provided elsewhere under Patient Instructions section of medical record. This document was created in part using AI and STT-dictation technology, any transcriptional errors that may result from this process are unintentional.  Patient: Julie Acevedo  Service: E/M   PCP: Sharma Coyer, MD  DOB: [birth year]  DOS: 01/04/2025  Provider: Eric DELENA Como, MD  MRN: [id number]  Delivery: Face-to-face  Specialty: Interventional Pain Management  Type: Established Patient  Setting: Ambulatory outpatient facility  Specialty designation: 09  Referring Prov.: Simmons-Robinson, Coyer, MD  Location: Outpatient office facility       History of present illness (HPI) Julie Acevedo, a 74 y.o. year old female, is here today because of her Lumbar facet joint pain [M54.59]. Julie Acevedo primary complain today is No chief complaint on file.  Pertinent problems: Julie Acevedo has Arthritis; Narrowing of intervertebral disc space; Chronic low back pain of over 3 months duration; Lumbosacral radiculopathy at S1 (Left); Arthritis of knee, degenerative; Failed back surgical syndrome; Acquired trigger finger; Low back pain radiating to lower extremity (Right); PTTD (posterior tibial tendon dysfunction); Chronic pain syndrome; Abnormal CT scan, cervical spine (11/19/2022); Abnormal MRI, lumbar spine (09/25/2023); DDD (degenerative disc disease), cervical; DDD (degenerative disc disease), lumbar; Cervical foraminal stenosis (C3-C7); Chronic low back pain (1ry area of Pain) (Bilateral) (R>L) w/o sciatica; Chronic lower extremity pain (2ry area of Pain) (Right); Osteoarthritis of shoulder (Right); Osteoarthritis of knees (Bilateral); Chronic hip pain (3ry area of Pain) (Right); Lumbar facet joint pain; Lumbar facet joint syndrome; Arthropathy of hip (Right); Osteoarthritis of hip  (Right); 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  Pain Assessment: Severity of   is reported as a  /10. Location:    / . Onset:  . Quality:  . Timing:  . Modifying factor(s):  SABRA Vitals:  vitals were not taken for this visit.  BMI: Estimated body mass index is 22.32 kg/m as calculated from the following:   Height as of 12/30/24: 5' 3 (1.6 m).   Weight as of 12/30/24: 126 lb (57.2 kg).  Last encounter: 07/02/2024. Last procedure: 06/16/2024.  Reason for encounter: evaluation of worsening, or previously known (established) problem.   Discussed the use of AI scribe software for clinical note transcription with the patient, who gave verbal consent to proceed.  History of Present Illness           Pharmacotherapy Assessment   Opioid Analgesic(s):  No chronic opioid analgesics therapy prescribed by our practice. None MME/day: 0 mg/day   Monitoring: [place] PMP: PDMP reviewed during this encounter.       Pharmacotherapy: No side-effects or adverse reactions reported. Compliance: No problems identified. Effectiveness: Clinically acceptable.  No notes on file  UDS:  Summary  Date Value Ref Range Status  06/19/2023 Note  Final    Comment:    ==================================================================== Compliance Drug Analysis, Ur ==================================================================== Test                             Result       Flag  Units  Drug Present and Declared for Prescription Verification   Gabapentin                       PRESENT      EXPECTED   Mirtazapine                     PRESENT      EXPECTED   Hydroxyzine                     PRESENT      EXPECTED   Metoprolol                      PRESENT      EXPECTED  Drug Absent but Declared for Prescription Verification   Levetiracetam                   Not Detected UNEXPECTED ==================================================================== Test                      Result    Flag   Units      Ref Range   Creatinine              60               mg/dL      >=79 ==================================================================== Declared Medications:  The flagging and interpretation on this report are based on the  following declared medications.  Unexpected results may arise from  inaccuracies in the declared medications.   **Note: The testing scope of this panel includes these medications:   Gabapentin  (Neurontin )  Hydroxyzine   Levetiracetam  (Keppra )  Metoprolol   Mirtazapine  (Remeron )   **Note: The testing scope of this panel does not include the  following reported medications:   Atorvastatin   Calcitriol   Diazoxide  (Proglycem )  Linaclotide  (Linzess )  Rosuvastatin  (Crestor )  Triamcinolone  (Kenalog ) ==================================================================== For clinical consultation, please call [phone]. ====================================================================     No results found for: CBDTHCR No results found for: D8THCCBX No results found for: D9THCCBX  ROS  Constitutional: Denies any fever or chills Gastrointestinal: No reported hemesis, hematochezia, vomiting, or acute GI distress Musculoskeletal: Denies any acute onset joint swelling, redness, loss of ROM, or weakness Neurological: No reported episodes of acute onset apraxia, aphasia, dysarthria, agnosia, amnesia, paralysis, loss of coordination, or loss of consciousness  Medication Review  Accu-Chek Softclix Lancets, atorvastatin , blood  glucose meter kit and supplies, calcitRIOL , diazoxide , diphenhydrAMINE , gabapentin , glucose blood, hydrOXYzine , hydrochlorothiazide , ibuprofen, levETIRAcetam , linaclotide , meclizine , metoprolol  succinate, mirtazapine , omeprazole , ondansetron , predniSONE , rivastigmine , rosuvastatin , and triamcinolone  cream  History Review  Allergy: Julie Acevedo is allergic to lotrel [amlodipine  besy-benazepril hcl], contrast media [iodinated contrast media], niacin, and sulfa antibiotics. Drug: Julie Acevedo  reports no history of drug use. Alcohol:  reports no history of alcohol use. Tobacco:  reports that she has never smoked. She has never used smokeless tobacco. Social: Julie Acevedo  reports that she has never smoked. She has never used smokeless tobacco. She reports that she does not drink alcohol and does not use drugs. Medical:  has a past medical history of Anemia, Arthritis, COVID-19 (11/19/2019), Diverticulitis, DM (diabetes mellitus) (HCC), History of methicillin resistant staphylococcus aureus (MRSA) (2017), HTN (hypertension), Hyperlipidemia, L-S radiculopathy (06/11/2015), Left leg swelling (01/22/2023), Lower abdominal pain (08/08/2022), Memory difficulties (09/01/2015), Multiple thyroid  nodules, Myocardial infarction (HCC) (1995), Partial small bowel obstruction (HCC) (04/23/2016), SBO (small bowel obstruction) (HCC), Short-term memory loss, and Thyroid  nodule. Surgical: Julie Acevedo  has a  past surgical history that includes Hemicolectomy; Lumbar disc surgery; Roux-en-Y Gastric Bypass (2010); Cholecystectomy; Eye surgery; Hand surgery; Foot surgery; Abdominal hysterectomy; Tonsillectomy; Upper gi endoscopy (01/12/2016); Cardiac catheterization (2000); Laparoscopic lysis of adhesions (N/A, 08/21/2016); Ventral hernia repair (N/A, 08/21/2016); Insertion of mesh (N/A, 08/21/2016); Cystocele repair (N/A, 03/19/2017); Cystoscopy (N/A, 03/19/2017); Laparoscopic removal abdominal mass; LOOP RECORDER INSERTION (N/A,  03/21/2018); laparotomy (N/A, 04/16/2019); XI robotic assisted ventral hernia (N/A, 12/08/2019); Insertion of mesh (N/A, 12/08/2019); Breast excisional biopsy (Right, 1971); Small intestine surgery; and Esophagogastroduodenoscopy (egd) with propofol  (N/A, 12/15/2021). Family: family history includes Breast cancer in her paternal grandmother; Breast cancer (age of onset: 52) in her paternal aunt; Breast cancer (age of onset: 17) in her paternal aunt; COPD in her mother; Cancer in her paternal aunt and paternal grandmother; Cancer (age of onset: 33) in her father; Congestive Heart Failure in her maternal grandmother; Coronary artery disease (age of onset: 7) in her father; Diabetes in her mother; Emphysema in her maternal grandfather; Heart attack in her paternal grandfather; Hypertension in her mother.  Laboratory Chemistry Profile   Renal Lab Results  Component Value Date   BUN 12 06/03/2024   CREATININE 1.46 (H) 06/03/2024   BCR 8 (L) 06/03/2024   GFRAA 42 (L) 12/05/2020   GFRNONAA 42 (L) 06/19/2023    Hepatic Lab Results  Component Value Date   AST 27 06/03/2024   ALT 19 06/03/2024   ALBUMIN 3.8 06/03/2024   ALKPHOS 174 (H) 06/03/2024   LIPASE 27 08/31/2022   AMMONIA 68 (H) 11/15/2012    Electrolytes Lab Results  Component Value Date   NA 145 (H) 06/03/2024   K 3.7 06/03/2024   CL 108 (H) 06/03/2024   CALCIUM  8.7 06/03/2024   MG 2.2 06/19/2023   PHOS 3.4 11/20/2022    Bone Lab Results  Component Value Date   VD25OH 51.23 08/09/2022   25OHVITD1 39 06/19/2023   25OHVITD2 1.0 06/19/2023   25OHVITD3 38 06/19/2023    Inflammation (CRP: Acute Phase) (ESR: Chronic Phase) Lab Results  Component Value Date   CRP 1 06/03/2024   ESRSEDRATE 13 06/03/2024   LATICACIDVEN 1.1 08/21/2022         Note: Above Lab results reviewed.  Recent Imaging Review  DG Foot Complete Left Please see detailed radiograph report in office note. Note: Reviewed        Physical Exam  Vitals:  There were no vitals taken for this visit. BMI: Estimated body mass index is 22.32 kg/m as calculated from the following:   Height as of 12/30/24: 5' 3 (1.6 m).   Weight as of 12/30/24: 126 lb (57.2 kg). Ideal: Ideal body weight: 52.4 kg (115 lb 8.3 oz) Adjusted ideal body weight: 54.3 kg (119 lb 11.4 oz) General appearance: Well nourished, well developed, and well hydrated. In no apparent acute distress Mental status: Alert, oriented x 3 (person, place, & time)       Respiratory: No evidence of acute respiratory distress Eyes: PERLA   Assessment   Diagnosis Status  1. Lumbar facet joint pain   2. Chronic low back pain (1ry area of Pain) (Bilateral) (R>L) w/o sciatica   3. Chronic low back pain of over 3 months duration    Controlled Controlled Controlled   Updated Problems: No problems updated.  Plan of Care  Problem-specific:  Assessment and Plan            Ms. Acevedo KISTNER has a current medication list which includes the following long-term medication(s): accu-chek  guide, diphenhydramine , diphenhydramine , gabapentin , hydrochlorothiazide , levetiracetam , linaclotide , metoprolol  succinate, mirtazapine , omeprazole , and rosuvastatin .  Pharmacotherapy (Medications Ordered): No orders of the defined types were placed in this encounter.  Orders:  No orders of the defined types were placed in this encounter.    Interventional Therapies  Risk Factors  Considerations:  Cardiologist: Cara Lovelace, MD ([facility])  Neurologist: Jannett Fairly, MD [facility] neurology) Hx of MI (1995)  Hx of TIAs  T2DM  Stage 3b CKD  [name]  Memory impairment  Hx of Bowel Obstruction (04/2019)  GERD  s/p Gastric Bypass for obesity  Malnutrition-malabsorption  Hx. Seizures      Planned  Pending:      Under consideration:   Therapeutic bilateral lumbar facet RFA #1    Completed:   Therapeutic bilateral lumbar facet MBB x2 (09/10/2023) (100/100/100/100)   Therapeutic left caudal ESI x2 (contrast allergy protocol ordered) (05/05/2024) (100/100/50/LE:100%LBP:50%)   Therapeutic  Palliative (PRN) options:   Therapeutic left caudal ESI (for lower extremity pain)  Therapeutic bilateral lumbar facet MBB (for low back pain)    Completed by other providers:   Therapeutic/diagnostic right L5 TFESI x2 (04/11/2016, 05/02/2016) by Morene Falcon, DO [facility])  Therapeutic/diagnostic right S1 TFESI x2 (04/11/2016, 05/02/2016) by Morene Falcon, DO [facility]R)  Therapeutic/diagnostic right L3 TFESI x1 (07/19/2016) by Morene Falcon, DO ([facility])      No follow-ups on file.    Recent Visits No visits were found meeting these conditions. Showing recent visits within past 90 days and meeting all other requirements Future Appointments Date Type Provider Dept  01/04/25 Appointment Tanya Glisson, MD [facility]  Showing future appointments within next 90 days and meeting all other requirements  I discussed the assessment and treatment plan with the patient. The patient was provided an opportunity to ask questions and all were answered. The patient agreed with the plan and demonstrated an understanding of the instructions.  Patient advised to call back or seek an in-person evaluation if the symptoms or condition worsens.  Duration of encounter: *** minutes.  Total time on encounter, as per AMA guidelines included both the face-to-face and non-face-to-face time personally spent by the physician and/or other qualified health care professional(s) on the day of the encounter (includes time in activities that require the physician or other qualified health care professional and does not include time in activities normally performed by clinical staff). Physician's time may include the following activities when performed: Preparing to see the patient (e.g., pre-charting review of records, searching for previously ordered imaging, lab work, and nerve  conduction tests) Review of prior analgesic pharmacotherapies. Reviewing PMP Interpreting ordered tests (e.g., lab work, imaging, nerve conduction tests) Performing post-procedure evaluations, including interpretation of diagnostic procedures Obtaining and/or reviewing separately obtained history Performing a medically appropriate examination and/or evaluation Counseling and educating the patient/family/caregiver Ordering medications, tests, or procedures Referring and communicating with other health care professionals (when not separately reported) Documenting clinical information in the electronic or other health record Independently interpreting results (not separately reported) and communicating results to the patient/ family/caregiver Care coordination (not separately reported)  Note by: Glisson DELENA Tanya, MD (TTS and AI technology used. I apologize for any typographical errors that were not detected and corrected.) Date: 01/04/2025; Time: 6:59 PM

## 2025-01-03 NOTE — Patient Instructions (Signed)
 " ______________________________________________________________________    Procedure instructions  Stop blood-thinners  Do not eat or drink fluids (other than water ) for 8 hours before your procedure  No water  for 2 hours before your procedure  Take your blood pressure medicine with a sip of water   Arrive 30 minutes before your appointment  If sedation is planned, bring suitable driver. [place], [place], & public transportation are NOT APPROVED)  Carefully read the Preparing for your procedure detailed instructions  If you have questions call us  at [phone]  Procedure appointments are for procedures only.   NO medication refills or new problem evaluations will be done on procedure days.   Only the scheduled, pre-approved procedure and side will be done.   ______________________________________________________________________     ______________________________________________________________________    Preparing for your procedure  Appointments: If you think you may not be able to keep your appointment, call 24-48 hours in advance to cancel. We need time to make it available to others.  Procedure visits are for procedures only. During your procedure appointment there will be: NO Prescription Refills*. NO medication changes or discussions*. NO discussion of disability issues*. NO unrelated pain problem evaluations*. NO evaluations to order other pain procedures*. *These will be addressed at a separate and distinct evaluation encounter on the provider's evaluation schedule and not during procedure days.  Instructions: Food intake: Avoid eating anything solid for at least 8 hours prior to your procedure. Clear liquid intake: You may take clear liquids such as water  up to 2 hours prior to your procedure. (No carbonated drinks. No soda.) Transportation: Unless otherwise stated by your physician, bring a driver. (Driver cannot be a Market [employer]earcher, Pharmacist, Community, or any other form of public  transportation.) Morning Medicines: Except for blood thinners, take all of your other morning medications with a sip of water . Make sure to take your heart and blood pressure medicines. If your blood pressure's lower number is above 100, the case will be rescheduled. Blood thinners: Make sure to stop your blood thinners as instructed.  If you take a blood thinner, but were not instructed to stop it, call our office [phone] and ask to talk to a nurse. Not stopping a blood thinner prior to certain procedures could lead to serious complications. Diabetics on insulin: Notify the staff so that you can be scheduled 1st case in the morning. If your diabetes requires high dose insulin, take only  of your normal insulin dose the morning of the procedure and notify the staff that you have done so. Preventing infections: Shower with an antibacterial soap the morning of your procedure.  Build-up your immune system: Take 1000 mg of Vitamin C with every meal (3 times a day) the day prior to your procedure. Antibiotics: Inform the nursing staff if you are taking any antibiotics or if you have any conditions that may require antibiotics prior to procedures. (Example: recent joint implants)   Pregnancy: If you are pregnant make sure to notify the nursing staff. Not doing so may result in injury to the fetus, including death.  Sickness: If you have a cold, fever, or any active infections, call and cancel or reschedule your procedure. Receiving steroids while having an infection may result in complications. Arrival: You must be in the facility at least 30 minutes prior to your scheduled procedure. Tardiness: Your scheduled time is also the cutoff time. If you do not arrive at least 15 minutes prior to your procedure, you will be rescheduled.  Children: Do not bring any children  with you. Make arrangements to keep them home. Dress appropriately: There is always a possibility that your clothing may get soiled. Avoid  long dresses. Valuables: Do not bring any jewelry or valuables.  Reasons to call and reschedule or cancel your procedure: (Following these recommendations will minimize the risk of a serious complication.) Surgeries: Avoid having procedures within 2 weeks of any surgery. (Avoid for 2 weeks before or after any surgery). Flu Shots: Avoid having procedures within 2 weeks of a flu shots or . (Avoid for 2 weeks before or after immunizations). Barium: Avoid having a procedure within 7-10 days after having had a radiological study involving the use of radiological contrast. (Myelograms, Barium swallow or enema study). Heart attacks: Avoid any elective procedures or surgeries for the initial 6 months after a Myocardial Infarction (Heart Attack). Blood thinners: It is imperative that you stop these medications before procedures. Let us  know if you if you take any blood thinner.  Infection: Avoid procedures during or within two weeks of an infection (including chest colds or gastrointestinal problems). Symptoms associated with infections include: Localized redness, fever, chills, night sweats or profuse sweating, burning sensation when voiding, cough, congestion, stuffiness, runny nose, sore throat, diarrhea, nausea, vomiting, cold or Flu symptoms, recent or current infections. It is specially important if the infection is over the area that we intend to treat. Heart and lung problems: Symptoms that may suggest an active cardiopulmonary problem include: cough, chest pain, breathing difficulties or shortness of breath, dizziness, ankle swelling, uncontrolled high or unusually low blood pressure, and/or palpitations. If you are experiencing any of these symptoms, cancel your procedure and contact your primary care physician for an evaluation.  Remember:  Regular Business hours are:  Monday to Thursday 8:00 AM to 4:00 PM  Provider's Schedule: Eric Como, MD:  Procedure days: Tuesday and Thursday 7:30  AM to 4:00 PM  Wallie Sherry, MD:  Procedure days: Monday and Wednesday 7:30 AM to 4:00 PM Last  Updated: 11/26/2023 ______________________________________________________________________     ______________________________________________________________________    General Risks and Possible Complications  Patient Responsibilities: It is important that you read this as it is part of your informed consent. It is our duty to inform you of the risks and possible complications associated with treatments offered to you. It is your responsibility as a patient to read this and to ask questions about anything that is not clear or that you believe was not covered in this document.  Patients Rights: You have the right to refuse treatment. You also have the right to change your mind, even after initially having agreed to have the treatment done. However, under this last option, if you wait until the last second to change your mind, you may be charged for the materials used up to that point.  Introduction: Medicine is not an visual merchandiser. Everything in Medicine, including the lack of treatment(s), carries the potential for danger, harm, or loss (which is by definition: Risk). In Medicine, a complication is a secondary problem, condition, or disease that can aggravate an already existing one. All treatments carry the risk of possible complications. The fact that a side effects or complications occurs, does not imply that the treatment was conducted incorrectly. It must be clearly understood that these can happen even when everything is done following the highest safety standards.  No treatment: You can choose not to proceed with the proposed treatment alternative. The PRO(s) would include: avoiding the risk of complications associated with the therapy. The CON(s) would  include: not getting any of the treatment benefits. These benefits fall under one of three categories: diagnostic; therapeutic; and/or  palliative. Diagnostic benefits include: getting information which can ultimately lead to improvement of the disease or symptom(s). Therapeutic benefits are those associated with the successful treatment of the disease. Finally, palliative benefits are those related to the decrease of the primary symptoms, without necessarily curing the condition (example: decreasing the pain from a flare-up of a chronic condition, such as incurable terminal cancer).  General Risks and Complications: These are associated to most interventional treatments. They can occur alone, or in combination. They fall under one of the following six (6) categories: no benefit or worsening of symptoms; bleeding; infection; nerve damage; allergic reactions; and/or death. No benefits or worsening of symptoms: In Medicine there are no guarantees, only probabilities. No healthcare provider can ever guarantee that a medical treatment will work, they can only state the probability that it may. Furthermore, there is always the possibility that the condition may worsen, either directly, or indirectly, as a consequence of the treatment. Bleeding: This is more common if the patient is taking a blood thinner, either prescription or over the counter (example: Goody Powders, Fish oil, Aspirin , Garlic, etc.), or if suffering a condition associated with impaired coagulation (example: Hemophilia, cirrhosis of the liver, low platelet counts, etc.). However, even if you do not have one on these, it can still happen. If you have any of these conditions, or take one of these drugs, make sure to notify your treating physician. Infection: This is more common in patients with a compromised immune system, either due to disease (example: diabetes, cancer, human immunodeficiency virus [HIV], etc.), or due to medications or treatments (example: therapies used to treat cancer and rheumatological diseases). However, even if you do not have one on these, it can still  happen. If you have any of these conditions, or take one of these drugs, make sure to notify your treating physician. Nerve Damage: This is more common when the treatment is an invasive one, but it can also happen with the use of medications, such as those used in the treatment of cancer. The damage can occur to small secondary nerves, or to large primary ones, such as those in the spinal cord and brain. This damage may be temporary or permanent and it may lead to impairments that can range from temporary numbness to permanent paralysis and/or brain death. Allergic Reactions: Any time a substance or material comes in contact with our body, there is the possibility of an allergic reaction. These can range from a mild skin rash (contact dermatitis) to a severe systemic reaction (anaphylactic reaction), which can result in death. Death: In general, any medical intervention can result in death, most of the time due to an unforeseen complication. ______________________________________________________________________      ______________________________________________________________________    Steroid injections  Common steroids for injections Triamcinolone : Used by many sports medicine physicians for large joint and bursal injections, often combined with a local anesthetic like lidocaine . A study focusing on coccydynia (tailbone pain) found triamcinolone  was more effective than betamethasone, suggesting it may also be preferable for other localized inflammation conditions. Methylprednisolone: A common alternative to triamcinolone  that is also a strong anti-inflammatory. It is available in different formulations, with the acetate suspension being the long-acting option for intra-articular injections. Dexamethasone : This is a non-particulate steroid, meaning it has a lower risk of tissue damage compared to particulate steroids like triamcinolone  and methylprednisolone. While less common for this specific  use, it is an option for targeted injections.   Considerations for physicians Particulate vs. non-particulate steroids: Triamcinolone  and methylprednisolone are particulate, meaning they can clump together. Dexamethasone  is non-particulate. Particulate steroids are often preferred for their longer-lasting effects but carry a theoretical higher risk for certain injections (though this is less of a concern in the costochondral joints). Combined injectate: Corticosteroids are typically mixed with a local anesthetic like lidocaine  to provide both immediate pain relief (from the anesthetic) and longer-term inflammation reduction (from the steroid). Imaging guidance: To ensure accurate placement of the needle and medication, physicians may use ultrasound or fluoroscopic guidance for the injection, especially in complex or refractory cases.   Patient guidance Before undergoing a steroid injection, discuss the options with your physician. They will determine the best steroid, dosage, and procedure for your specific case based on factors like: Severity of your condition History of response to other treatments Your overall health status Experience and preference of the physician  Last  Updated: 08/11/2024 ______________________________________________________________________     "

## 2025-01-04 ENCOUNTER — Encounter: Payer: Self-pay | Admitting: Pain Medicine

## 2025-01-04 ENCOUNTER — Ambulatory Visit: Attending: Pain Medicine | Admitting: Pain Medicine

## 2025-01-04 VITALS — BP 138/77 | HR 67 | Temp 97.3°F | Resp 16 | Ht 63.0 in | Wt 126.0 lb

## 2025-01-04 DIAGNOSIS — Z800 Family history of malignant neoplasm of digestive organs: Secondary | ICD-10-CM | POA: Insufficient documentation

## 2025-01-04 DIAGNOSIS — M5459 Other low back pain: Secondary | ICD-10-CM | POA: Insufficient documentation

## 2025-01-04 DIAGNOSIS — R937 Abnormal findings on diagnostic imaging of other parts of musculoskeletal system: Secondary | ICD-10-CM | POA: Insufficient documentation

## 2025-01-04 DIAGNOSIS — K529 Noninfective gastroenteritis and colitis, unspecified: Secondary | ICD-10-CM | POA: Insufficient documentation

## 2025-01-04 DIAGNOSIS — M545 Low back pain, unspecified: Secondary | ICD-10-CM | POA: Insufficient documentation

## 2025-01-04 DIAGNOSIS — G8929 Other chronic pain: Secondary | ICD-10-CM | POA: Insufficient documentation

## 2025-01-04 DIAGNOSIS — M961 Postlaminectomy syndrome, not elsewhere classified: Secondary | ICD-10-CM | POA: Insufficient documentation

## 2025-01-04 DIAGNOSIS — M47816 Spondylosis without myelopathy or radiculopathy, lumbar region: Secondary | ICD-10-CM | POA: Diagnosis not present

## 2025-01-04 DIAGNOSIS — R109 Unspecified abdominal pain: Secondary | ICD-10-CM | POA: Insufficient documentation

## 2025-01-04 DIAGNOSIS — Z83719 Family history of colon polyps, unspecified: Secondary | ICD-10-CM | POA: Insufficient documentation

## 2025-01-04 DIAGNOSIS — R198 Other specified symptoms and signs involving the digestive system and abdomen: Secondary | ICD-10-CM | POA: Insufficient documentation

## 2025-01-04 MED ORDER — METHOCARBAMOL 1000 MG/10ML IJ SOLN
200.0000 mg | Freq: Once | INTRAMUSCULAR | Status: AC
  Administered 2025-01-04: 1000 mg via INTRAMUSCULAR
  Filled 2025-01-04: qty 10

## 2025-01-04 NOTE — Progress Notes (Signed)
 Safety precautions to be maintained throughout the outpatient stay will include: orient to surroundings, keep bed in low position, maintain call bell within reach at all times, provide assistance with transfer out of bed and ambulation.

## 2025-01-05 ENCOUNTER — Other Ambulatory Visit: Payer: Self-pay | Admitting: Family Medicine

## 2025-01-10 ENCOUNTER — Emergency Department
Admission: EM | Admit: 2025-01-10 | Discharge: 2025-01-10 | Disposition: A | Discharge status: Home / Self Care | Attending: Emergency Medicine | Admitting: Emergency Medicine

## 2025-01-10 ENCOUNTER — Other Ambulatory Visit: Payer: Self-pay

## 2025-01-10 ENCOUNTER — Emergency Department

## 2025-01-10 DIAGNOSIS — I10 Essential (primary) hypertension: Secondary | ICD-10-CM | POA: Insufficient documentation

## 2025-01-10 DIAGNOSIS — E119 Type 2 diabetes mellitus without complications: Secondary | ICD-10-CM | POA: Insufficient documentation

## 2025-01-10 DIAGNOSIS — Z8616 Personal history of COVID-19: Secondary | ICD-10-CM | POA: Insufficient documentation

## 2025-01-10 DIAGNOSIS — R0789 Other chest pain: Secondary | ICD-10-CM | POA: Diagnosis present

## 2025-01-10 DIAGNOSIS — Z79899 Other long term (current) drug therapy: Secondary | ICD-10-CM | POA: Insufficient documentation

## 2025-01-10 LAB — BASIC METABOLIC PANEL WITH GFR
Anion gap: 10 (ref 5–15)
BUN: 11 mg/dL (ref 8–23)
CO2: 24 mmol/L (ref 22–32)
Calcium: 8.6 mg/dL — ABNORMAL LOW (ref 8.9–10.3)
Chloride: 108 mmol/L (ref 98–111)
Creatinine, Ser: 1.25 mg/dL — ABNORMAL HIGH (ref 0.44–1.00)
GFR, Estimated: 46 mL/min — ABNORMAL LOW
Glucose, Bld: 98 mg/dL (ref 70–99)
Potassium: 3.6 mmol/L (ref 3.5–5.1)
Sodium: 142 mmol/L (ref 135–145)

## 2025-01-10 LAB — CBC
HCT: 26.9 % — ABNORMAL LOW (ref 36.0–46.0)
Hemoglobin: 8.7 g/dL — ABNORMAL LOW (ref 12.0–15.0)
MCH: 25.9 pg — ABNORMAL LOW (ref 26.0–34.0)
MCHC: 32.3 g/dL (ref 30.0–36.0)
MCV: 80.1 fL (ref 80.0–100.0)
Platelets: 234 10*3/uL (ref 150–400)
RBC: 3.36 MIL/uL — ABNORMAL LOW (ref 3.87–5.11)
RDW: 18.4 % — ABNORMAL HIGH (ref 11.5–15.5)
WBC: 7.8 10*3/uL (ref 4.0–10.5)
nRBC: 0.3 % — ABNORMAL HIGH (ref 0.0–0.2)

## 2025-01-10 LAB — TROPONIN T, HIGH SENSITIVITY: Troponin T High Sensitivity: 18 ng/L (ref 0–19)

## 2025-01-10 MED ORDER — FENTANYL CITRATE (PF) 50 MCG/ML IJ SOSY
50.0000 ug | PREFILLED_SYRINGE | Freq: Once | INTRAMUSCULAR | Status: AC
  Administered 2025-01-10: 50 ug via INTRAVENOUS
  Filled 2025-01-10: qty 1

## 2025-01-10 MED ORDER — ONDANSETRON HCL 4 MG/2ML IJ SOLN
4.0000 mg | Freq: Once | INTRAMUSCULAR | Status: AC
  Administered 2025-01-10: 4 mg via INTRAVENOUS
  Filled 2025-01-10: qty 2

## 2025-01-10 MED ORDER — OXYCODONE HCL 5 MG PO TABS: 5.0000 mg | ORAL_TABLET | Freq: Three times a day (TID) | ORAL | 0 refills | Status: AC | PRN

## 2025-01-10 MED ORDER — ONDANSETRON 4 MG PO TBDP: 4.0000 mg | ORAL_TABLET | Freq: Four times a day (QID) | ORAL | 0 refills | Status: AC | PRN

## 2025-01-10 MED ORDER — DOCUSATE SODIUM 100 MG PO CAPS: 100.0000 mg | ORAL_CAPSULE | Freq: Two times a day (BID) | ORAL | 0 refills | Status: AC

## 2025-01-10 NOTE — Discharge Instructions (Signed)
 Please follow-up with your cardiologist as scheduled.  You may take over-the-counter Tylenol  1000 mg every 6 hours as needed for pain.  You are being provided a prescription for opiates (also known as narcotics) for pain control.  Opiates can be addictive and should only be used when absolutely necessary for pain control when other alternatives do not work.  We recommend you only use them for the recommended amount of time and only as prescribed.  Please do not take with other sedative medications or alcohol.  Please do not drive, operate machinery, make important decisions while taking opiates.  Please note that these medications can be addictive and have high abuse potential.  Patients can become addicted to narcotics after only taking them for a few days.  Please keep these medications locked away from children, teenagers or any family members with history of substance abuse.  Narcotic pain medicine may also make you constipated.  You may use over-the-counter medications such as MiraLAX , Colace to prevent constipation.  If you become constipated, you may use over-the-counter enemas as needed.  Itching and nausea are also common side effects of narcotic pain medication.  If you develop uncontrolled vomiting or a rash, please stop these medications and seek medical care.

## 2025-01-10 NOTE — ED Triage Notes (Signed)
 Pt to ED via EMS from home, pt reports chest pain sudden onset that began earlier tonight. 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 Pain is worse with palpation pt has loop recorder in place, pain is around site. Pt has been seen by pcp for same. Pt was given 75mcg fentanyl  by ems and 324 asa and 1 SL nitroglycerin .

## 2025-01-10 NOTE — ED Provider Notes (Signed)
 "  [facility] Provider Note    Event Date/Time   First MD Initiated Contact with Patient 01/10/25 2131     (approximate)   History   Chest Pain   HPI  Julie Acevedo is a 74 y.o. female with history of hypertension, diabetes, hyperlipidemia, coronary artery disease who presents emergency department with left chest wall pain that has been ongoing for over a year.  Patient had loop recorder placed in 2019 by Dr. Francyne.  States that her pain is mostly over the loop recorder and she is asking for it to be removed.  States she has talked to her primary care doctor about this and has follow-up with her cardiologist next week.  Denies any fevers, cough, lower extremity swelling or pain.  No history of PE or DVT.  No calf tenderness.  No shortness of breath.  Pain worse with palpation.  No injury to the chest wall.  When asked what prompted her to come to the ED today she states that pain acutely worsened over the past 3 days.   History provided by patient.    Past Medical History:  Diagnosis Date   Anemia    Arthritis    COVID-19 11/19/2019   Diverticulitis    DM (diabetes mellitus) (HCC)    typ e 2   History of methicillin resistant staphylococcus aureus (MRSA) 2017   HTN (hypertension)    Hyperlipidemia    L-S radiculopathy 06/11/2015   Left leg swelling 01/22/2023   Lower abdominal pain 08/08/2022   Memory difficulties 09/01/2015   Multiple thyroid  nodules    Myocardial infarction (HCC) 1995   Partial small bowel obstruction (HCC) 04/23/2016   SBO (small bowel obstruction) (HCC)    Short-term memory loss    Thyroid  nodule     Past Surgical History:  Procedure Laterality Date   ABDOMINAL HYSTERECTOMY     due to endometriosis-1 ovary left   BREAST EXCISIONAL BIOPSY Right 1971   neg   CARDIAC CATHETERIZATION  2000   no significant CAD per note of Dr Fernande in 2013    CHOLECYSTECTOMY     CYSTOCELE REPAIR N/A 03/19/2017   Procedure: ANTERIOR  REPAIR (CYSTOCELE);  Surgeon: Glendia Elizabeth, MD;  Location: [facility];  Service: Urology;  Laterality: N/A;   CYSTOSCOPY N/A 03/19/2017   Procedure: CYSTOSCOPY;  Surgeon: Glendia Elizabeth, MD;  Location: [facility];  Service: Urology;  Laterality: N/A;   ESOPHAGOGASTRODUODENOSCOPY (EGD) WITH PROPOFOL  N/A 12/15/2021   Procedure: ESOPHAGOGASTRODUODENOSCOPY (EGD) WITH PROPOFOL ;  Surgeon: Maryruth Ole DASEN, MD;  Location: [facility];  Service: Endoscopy;  Laterality: N/A;   EYE SURGERY     FOOT SURGERY     HAND SURGERY     HEMICOLECTOMY     INSERTION OF MESH N/A 08/21/2016   Procedure: INSERTION OF MESH;  Surgeon: Morene Olives, MD;  Location: [facility];  Service: General;  Laterality: N/A;   INSERTION OF MESH N/A 12/08/2019   Procedure: INSERTION OF MESH;  Surgeon: Jordis Laneta FALCON, MD;  Location: [facility];  Service: General;  Laterality: N/A;   LAPAROSCOPIC LYSIS OF ADHESIONS N/A 08/21/2016   Procedure: LAPAROSCOPIC LYSIS OF ADHESIONS;  Surgeon: Morene Olives, MD;  Location: [facility];  Service: General;  Laterality: N/A;   LAPAROSCOPIC REMOVAL ABDOMINAL MASS     LAPAROTOMY N/A 04/16/2019   Procedure: EXPLORATORY LAPAROTOMY;  Surgeon: Tye Millet, DO;  Location: [facility];  Service: General;  Laterality: N/A;   LOOP RECORDER INSERTION N/A 03/21/2018  Procedure: LOOP RECORDER INSERTION;  Surgeon: Francyne Headland, MD;  Location: [facility];  Service: Cardiovascular;  Laterality: N/A;   LUMBAR DISC SURGERY     ROUX-EN-Y GASTRIC BYPASS  2010   Dr Gladis   SMALL INTESTINE SURGERY     TONSILLECTOMY     UPPER GI ENDOSCOPY  01/12/2016   normal larynx, normal esophagus. gastric bypass with a normal-sized pouch and intact staple line, normal examined jejunum, otherwise exam was normal   VENTRAL HERNIA REPAIR N/A 08/21/2016   Procedure: LAPAROSCOPIC VENTRAL HERNIA;  Surgeon: Morene Olives, MD;  Location: [facility];  Service: General;  Laterality: N/A;   XI ROBOTIC ASSISTED VENTRAL HERNIA  N/A 12/08/2019   Procedure: XI ROBOTIC ASSISTED VENTRAL HERNIA;  Surgeon: Jordis Laneta FALCON, MD;  Location: [facility];  Service: General;  Laterality: N/A;    MEDICATIONS:  Prior to Admission medications  Medication Sig Start Date End Date Taking? Authorizing Provider  ACCU-CHEK GUIDE test strip USE TO CHECK BLOOD SUGAR UP TO 4 TIMES A DAY AS DIRECTED 12/04/22   Ostwalt, Janna, PA-C  Accu-Chek Softclix Lancets lancets SMARTSIG:Topical 1-4 Times Daily 08/16/22   [provider]  atorvastatin  (LIPITOR) 40 MG tablet Take 1 tablet by mouth every morning.    [provider]  blood glucose meter kit and supplies Dispense Accu-Chek Guide. Use up to four times daily as directed. 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 08/16/22   Bertrum Charlie CROME, MD  calcitRIOL  (ROCALTROL ) 0.25 MCG capsule Take 0.25 mcg by mouth daily.    [provider]  diazoxide  (PROGLYCEM ) 50 MG/ML suspension take 1ml (50mg  total) BY MOUTH EVERY 8 HOURS    [provider]  diphenhydrAMINE  (BENADRYL ) 50 MG capsule Take 50 mg by mouth 1 hour before procedure. Must have a driver. 01/22/24   Tanya Glisson, MD  diphenhydrAMINE  (BENADRYL ) 50 MG capsule Take 50 mg by mouth 1 hour before procedure. Must have a driver. 05/04/24   Tanya Glisson, MD  gabapentin  (NEURONTIN ) 300 MG capsule TAKE 1 CAPSULE BY MOUTH 3 TIMES DAILY 11/01/23   Ostwalt, Janna, PA-C  hydrochlorothiazide  (HYDRODIURIL ) 25 MG tablet Take 0.5 tablets (12.5 mg total) by mouth daily. Take one HALF  tablet by mouth once daily 09/02/24 01/04/25  Simmons-Robinson, Rockie, MD  hydrOXYzine  (VISTARIL ) 100 MG capsule TAKE 1 CAPSULE BY MOUTH 3 TIMES DAILY 11/01/23   Ostwalt, Janna, PA-C  ibuprofen (ADVIL) 600 MG tablet Take 600 mg by mouth every 6 (six) hours as needed. 08/09/23   [provider]  levETIRAcetam  (KEPPRA ) 500 MG tablet Take 1 tablet (500 mg total) by mouth 2 (two) times daily. 11/21/22   Verdene Purchase, MD  linaclotide  (LINZESS ) 145  MCG CAPS capsule Take 1 capsule (145 mcg total) by mouth daily before breakfast. 08/17/24   Clifton, Kellie A, FNP  meclizine  (ANTIVERT ) 12.5 MG tablet TAKE 1 TABLET BY MOUTH 3 TIMES DAILY AS NEEDED FOR DIZZINESS. 08/11/24   Simmons-Robinson, Rockie, MD  metoprolol  succinate (TOPROL -XL) 25 MG 24 hr tablet TAKE 1 TABLET BY MOUTH EVERY MORNING 08/25/24   Simmons-Robinson, Makiera, MD  mirtazapine  (REMERON  SOL-TAB) 15 MG disintegrating tablet TAKE 1 TABLET BY MOUTH EVERYDAY AT BEDTIME    [provider]  omeprazole  (PRILOSEC) 20 MG capsule TAKE 1 CAPSULE BY MOUTH ONCE DAILY 09/23/24   Simmons-Robinson, Rockie, MD  ondansetron  (ZOFRAN -ODT) 4 MG disintegrating tablet Take 1 tablet (4 mg total) by mouth every 8 (eight) hours as needed. 05/19/24   Simmons-Robinson, Rockie, MD  predniSONE  (DELTASONE ) 50  MG tablet Take 1 tab PO 13 hrs, 7 hrs, and 1 hr before contrast media injection (procedure). 05/04/24   Tanya Glisson, MD  rivastigmine  (EXELON ) 3 MG capsule Take 3 mg by mouth 2 (two) times daily.    [provider]  rosuvastatin  (CRESTOR ) 40 MG tablet Take 1 tablet (40 mg total) by mouth daily. 09/02/24   Simmons-Robinson, Rockie, MD  triamcinolone  cream (KENALOG ) 0.5 % Apply 1 Application topically 3 (three) times daily as needed (flares).    [provider]    Physical Exam   Triage Vital Signs: ED Triage Vitals  Encounter Vitals Group     BP 01/10/25 2126 (!) 152/75     Girls Systolic BP Percentile --      Girls Diastolic BP Percentile --      Boys Systolic BP Percentile --      Boys Diastolic BP Percentile --      Pulse Rate 01/10/25 2126 (!) 58     Resp 01/10/25 2126 18     Temp 01/10/25 2126 97.9 F (36.6 C)     Temp Source 01/10/25 2126 Oral     SpO2 01/10/25 2126 98 %     Weight 01/10/25 2128 130 lb (59 kg)     Height 01/10/25 2128 5' 3 (1.6 m)     Head Circumference --      Peak Flow --      Pain Score 01/10/25 2127 1     Pain Loc --      Pain Education  --      Exclude from Growth Chart --     Most recent vital signs: Vitals:   01/10/25 2200 01/10/25 2201  BP: 135/67   Pulse: (!) 55   Resp: 15   Temp:    SpO2: 95% 95%    CONSTITUTIONAL: Alert, responds appropriately to questions. Well-appearing; well-nourished HEAD: Normocephalic, atraumatic EYES: Conjunctivae clear, pupils appear equal, sclera nonicteric ENT: normal nose; moist mucous membranes NECK: Supple, normal ROM CARD: RRR; S1 and S2 appreciated; tender over the left anterior chest wall over her loop recorder without any overlying skin changes or soft tissue swelling.  No redness or warmth around this area. RESP: Normal chest excursion without splinting or tachypnea; breath sounds clear and equal bilaterally; no wheezes, no rhonchi, no rales, no hypoxia or respiratory distress, speaking full sentences ABD/GI: Non-distended; soft, non-tender, no rebound, no guarding, no peritoneal signs RECTAL:  Normal rectal tone, no gross blood or melena, guaiac negative, no hemorrhoids appreciated, nontender rectal exam, no fecal impaction.  BACK: The back appears normal EXT: Normal ROM in all joints; no deformity noted, no edema, no calf tenderness or calf swelling SKIN: Normal color for age and race; warm; no rash on exposed skin NEURO: Moves all extremities equally, normal speech PSYCH: The patient's mood and manner are appropriate.   ED Results / Procedures / Treatments   LABS: (all labs ordered are listed, but only abnormal results are displayed) Labs Reviewed  BASIC METABOLIC PANEL WITH GFR - Abnormal; Notable for the following components:      Result Value   Creatinine, Ser 1.25 (*)    Calcium  8.6 (*)    GFR, Estimated 46 (*)    All other components within normal limits  CBC - Abnormal; Notable for the following components:   RBC 3.36 (*)    Hemoglobin 8.7 (*)    HCT 26.9 (*)    MCH 25.9 (*)    RDW 18.4 (*)  nRBC 0.3 (*)    All other components within normal limits   TROPONIN T, HIGH SENSITIVITY     EKG:  EKG Interpretation Date/Time:  Sunday January 10 2025 21:29:59 EST Ventricular Rate:  57 PR Interval:  127 QRS Duration:  88 QT Interval:  434 QTC Calculation: 423 R Axis:   63  Text Interpretation: Sinus rhythm Confirmed by Neomi Neptune [id number]) on 01/10/2025 9:53:56 PM         RADIOLOGY: My personal review and interpretation of imaging: CT chest shows no acute abnormality.  I have personally reviewed all radiology reports.   CT Chest Wo Contrast Result Date: 01/10/2025 EXAM: CT CHEST WITHOUT CONTRAST 01/10/2025 10:25:58 PM TECHNIQUE: CT of the chest was performed without the administration of intravenous contrast. Multiplanar reformatted images are provided for review. Automated exposure control, iterative reconstruction, and/or weight based adjustment of the mA/kV was utilized to reduce the radiation dose to as low as reasonably achievable. COMPARISON: 02/10/2024 CLINICAL HISTORY: Pain around loop recorder. Chest pain. FINDINGS: MEDIASTINUM: Heart and pericardium are unremarkable. The central airways are clear. The central pulmonary arteries are enlarged in keeping with changes of pulmonary arterial hypertension. Mild atherosclerotic calcification within the thoracic aorta. LYMPH NODES: No mediastinal, hilar or axillary lymphadenopathy. LUNGS AND PLEURA: No focal consolidation or pulmonary edema. No pleural effusion or pneumothorax. SOFT TISSUES/BONES: Implanted loop recorder noted within the anterior left chest wall. The osseous structures are diffusely sclerotic, unchanged from multiple prior examinations. This is nonspecific, but given its longstanding presence and uniformly, favors a condition such as primary or secondary hyperparathyroidism. Correlation with laboratory examination would be confirmatory. UPPER ABDOMEN: Surgical changes of cholecystectomy and gastric bypass noted. Limited images of the upper abdomen demonstrates no acute  abnormality. RAF score: Aortic atherosclerosis (icd10-i70.0) IMPRESSION: 1. No acute intrathoracic abnormality to explain chest pain or pain at the loop recorder site. Implanted loop recorder in the anterior left chest wall. 2. Enlarged central pulmonary arteries, consistent with pulmonary arterial hypertension. 3. Diffuse osseous sclerosis, unchanged from multiple prior examinations, favoring primary or secondary hyperparathyroidism; laboratory evaluation may confirm. 4. Postsurgical changes of cholecystectomy and gastric bypass. 5. RAF score includes aortic atherosclerosis (I70.0). Electronically signed by: Dorethia Molt MD 01/10/2025 10:37 PM EST RP Workstation: [id number]   DG Chest Portable 1 View Result Date: 01/10/2025 EXAM: 1 VIEW(S) XRAY OF THE CHEST 01/10/2025 09:44:00 PM COMPARISON: Comparison with cardiac CT 02/10/2024. CLINICAL HISTORY: Chest pain. FINDINGS: LINES, TUBES AND DEVICES: Cardiac loop recorder or implantable defibrillator in left chest. LUNGS AND PLEURA: Shallow inspiration. No focal pulmonary opacity. No pleural effusion. No pneumothorax. HEART AND MEDIASTINUM: No acute abnormality of the cardiac and mediastinal silhouettes. BONES AND SOFT TISSUES: Cholecystectomy clips noted. Surgical clips in the right upper quadrant. No acute osseous abnormality. IMPRESSION: 1. No acute cardiopulmonary findings. Electronically signed by: Elsie Gravely MD 01/10/2025 09:49 PM EST RP Workstation: [id number]     PROCEDURES:  Critical Care performed: No     .1-3 Lead EKG Interpretation  Performed by: Ritesh Opara, Neptune SAILOR, DO Authorized by: Wandalene Abrams, Neptune SAILOR, DO     Interpretation: normal     ECG rate:  56   ECG rate assessment: bradycardic     Rhythm: sinus bradycardia     Ectopy: none     Conduction: normal       IMPRESSION / MDM / ASSESSMENT AND PLAN / ED COURSE  I reviewed the triage vital signs and the nursing notes.    Patient here with complaints of chest pain  ongoing for the  past year over top of her loop recorder.  The patient is on the cardiac monitor to evaluate for evidence of arrhythmia and/or significant heart rate changes.   DIFFERENTIAL DIAGNOSIS (includes but not limited to):   Chest wall pain, pain from loop recorder, hematoma, abscess, no signs of developing cellulitis, pain seems atypical for ACS, PE, dissection, pneumonia, pneumothorax   Patient's presentation is most consistent with acute presentation with potential threat to life or bodily function.   PLAN: EKG shows no new ischemic change.  Labs, chest x-ray pending.  Will give pain medication.   MEDICATIONS GIVEN IN ED: Medications  fentaNYL  (SUBLIMAZE ) injection 50 mcg (50 mcg Intravenous Given 01/10/25 2238)  ondansetron  (ZOFRAN ) injection 4 mg (4 mg Intravenous Given 01/10/25 2238)     ED COURSE: Labs show no leukocytosis.  Afebrile here.  Chest x-ray reviewed and interpreted by myself and the radiologist and shows no acute abnormality.  Will obtain CT of the chest for further evaluation.  She has an allergy to contrast.  Will do this without contrast.  Low suspicion for active bleeding.  Doubt PE.  Troponin negative.  No indication for serial enzymes given pain ongoing for several days and very atypical in nature.  Patient does have a new anemia with hemoglobin of 8.7.  Denies bloody stool or melena and is guaiac negative here.  Discussed this finding with patient and recommended she follow-up with her primary care doctor for this.  Could be related to chronic kidney disease which appears stable today.  11:00 PM  Pt's CT scan reviewed and interpreted by myself and the radiologist and shows no acute abnormality.  No blood or sign of infection around the loop recorder.  No rib fracture.  No pneumonia.  Patient is more comfortable after fentanyl .  She already has a follow-up appointment scheduled with her cardiologist this week.  Encouraged her to follow-up regarding her chronic atypical  chest pain around her loop recorder.  Will discharge with short course of pain medication in the meantime.  Daughter will pick her up from the ED.  At this time, I do not feel there is any life-threatening condition present. I reviewed all nursing notes, vitals, pertinent previous records.  All lab and urine results, EKGs, imaging ordered have been independently reviewed and interpreted by myself.  I reviewed all available radiology reports from any imaging ordered this visit.  Based on my assessment, I feel the patient is safe to be discharged home without further emergent workup and can continue workup as an outpatient as needed. Discussed all findings, treatment plan as well as usual and customary return precautions.  They verbalize understanding and are comfortable with this plan.  Outpatient follow-up has been provided as needed.  All questions have been answered.    CONSULTS:  none   OUTSIDE RECORDS REVIEWED: Reviewed previous cardiology notes.       FINAL CLINICAL IMPRESSION(S) / ED DIAGNOSES   Final diagnoses:  Chest wall pain     Rx / DC Orders   ED Discharge Orders          Ordered    oxyCODONE  (ROXICODONE ) 5 MG immediate release tablet  Every 8 hours PRN        01/10/25 2250    ondansetron  (ZOFRAN -ODT) 4 MG disintegrating tablet  Every 6 hours PRN        01/10/25 2250    docusate sodium  (COLACE) 100 MG capsule  2 times daily  01/10/25 2250             Note:  This document was prepared using Dragon voice recognition software and may include unintentional dictation errors.   Austen Oyster, Josette SAILOR, DO 01/10/25 2302  "

## 2025-01-10 NOTE — ED Notes (Signed)
 To CT

## 2025-01-10 NOTE — ED Notes (Signed)
 Fall bundle in place, bed alarm activated. Call bell within reach.

## 2025-01-14 ENCOUNTER — Encounter: Payer: Self-pay | Admitting: Pain Medicine

## 2025-01-14 ENCOUNTER — Telehealth: Payer: Self-pay

## 2025-01-14 ENCOUNTER — Ambulatory Visit (HOSPITAL_BASED_OUTPATIENT_CLINIC_OR_DEPARTMENT_OTHER): Admitting: Pain Medicine

## 2025-01-14 ENCOUNTER — Ambulatory Visit
Admission: RE | Admit: 2025-01-14 | Discharge: 2025-01-14 | Disposition: A | Discharge status: Home / Self Care | Source: Ambulatory Visit | Attending: Pain Medicine | Admitting: Pain Medicine

## 2025-01-14 VITALS — BP 151/72 | HR 60 | Temp 98.1°F | Resp 16 | Ht 63.0 in | Wt 123.0 lb

## 2025-01-14 DIAGNOSIS — M51369 Other intervertebral disc degeneration, lumbar region without mention of lumbar back pain or lower extremity pain: Secondary | ICD-10-CM | POA: Diagnosis not present

## 2025-01-14 DIAGNOSIS — G8929 Other chronic pain: Secondary | ICD-10-CM

## 2025-01-14 DIAGNOSIS — M47816 Spondylosis without myelopathy or radiculopathy, lumbar region: Secondary | ICD-10-CM | POA: Diagnosis not present

## 2025-01-14 DIAGNOSIS — G8918 Other acute postprocedural pain: Secondary | ICD-10-CM

## 2025-01-14 DIAGNOSIS — M47817 Spondylosis without myelopathy or radiculopathy, lumbosacral region: Secondary | ICD-10-CM

## 2025-01-14 DIAGNOSIS — Z5189 Encounter for other specified aftercare: Secondary | ICD-10-CM | POA: Insufficient documentation

## 2025-01-14 DIAGNOSIS — M5459 Other low back pain: Secondary | ICD-10-CM

## 2025-01-14 DIAGNOSIS — R937 Abnormal findings on diagnostic imaging of other parts of musculoskeletal system: Secondary | ICD-10-CM | POA: Insufficient documentation

## 2025-01-14 DIAGNOSIS — M545 Low back pain, unspecified: Secondary | ICD-10-CM | POA: Diagnosis not present

## 2025-01-14 DIAGNOSIS — M4316 Spondylolisthesis, lumbar region: Secondary | ICD-10-CM

## 2025-01-14 MED ORDER — MIDAZOLAM HCL (PF) 2 MG/2ML IJ SOLN
0.5000 mg | Freq: Once | INTRAMUSCULAR | Status: AC
  Administered 2025-01-14: 2 mg via INTRAVENOUS
  Filled 2025-01-14: qty 2

## 2025-01-14 MED ORDER — ROPIVACAINE HCL 2 MG/ML IJ SOLN
9.0000 mL | Freq: Once | INTRAMUSCULAR | Status: AC
  Administered 2025-01-14: 9 mL via PERINEURAL
  Filled 2025-01-14: qty 20

## 2025-01-14 MED ORDER — TRIAMCINOLONE ACETONIDE 40 MG/ML IJ SUSP
40.0000 mg | Freq: Once | INTRAMUSCULAR | Status: AC
  Administered 2025-01-14: 40 mg
  Filled 2025-01-14: qty 1

## 2025-01-14 MED ORDER — LIDOCAINE HCL 2 % IJ SOLN
20.0000 mL | Freq: Once | INTRAMUSCULAR | Status: AC
  Administered 2025-01-14: 400 mg
  Filled 2025-01-14: qty 20

## 2025-01-14 MED ORDER — TRAMADOL HCL 50 MG PO TABS: 50.0000 mg | ORAL_TABLET | Freq: Four times a day (QID) | ORAL | 0 refills | Status: AC | PRN

## 2025-01-14 MED ORDER — PENTAFLUOROPROP-TETRAFLUOROETH EX AERO
INHALATION_SPRAY | Freq: Once | CUTANEOUS | Status: AC
  Administered 2025-01-14: 30 via TOPICAL

## 2025-01-14 MED ORDER — FENTANYL CITRATE (PF) 100 MCG/2ML IJ SOLN
25.0000 ug | INTRAMUSCULAR | Status: DC | PRN
  Administered 2025-01-14: 75 ug via INTRAVENOUS
  Filled 2025-01-14: qty 2

## 2025-01-14 NOTE — Patient Instructions (Signed)
 " ______________________________________________________________________    Post-Radiofrequency (RF) Discharge Instructions  You have just completed a Radiofrequency Neurotomy.  The following instructions will provide you with information and guidelines for self-care upon discharge.  If at any time you have questions or concerns please call your physician. DO NOT DRIVE YOURSELF!!  Instructions: Apply ice: Fill a plastic sandwich bag with crushed ice. Cover it with a small towel and apply to injection site. Apply for 15 minutes then remove x 15 minutes. Repeat sequence on day of procedure, until you go to bed. The purpose is to minimize swelling and discomfort after procedure. Apply heat: Apply heat to procedure site starting the day following the procedure. The purpose is to treat any soreness and discomfort from the procedure. Food intake: No eating limitations, unless stipulated above.  Nevertheless, if you have had sedation, you may experience some nausea.  In this case, it may be wise to wait at least two hours prior to resuming regular diet. Physical activities: Keep activities to a minimum for the first 8 hours after the procedure. For the first 24 hours after the procedure, do not drive a motor vehicle,  Operate heavy machinery, power tools, or handle any weapons.  Consider walking with the use of an assistive device or accompanied by an adult for the first 24 hours.  Do not drink alcoholic beverages including beer.  Do not make any important decisions or sign any legal documents. Go home and rest today.  Resume activities tomorrow, as tolerated.  Use caution in moving about as you may experience mild leg weakness.  Use caution in cooking, use of household electrical appliances and climbing steps. Driving: If you have received any sedation, you are not allowed to drive for 24 hours after your procedure. Blood thinner: Restart your blood thinner 6 hours after your procedure. (Only for those taking  blood thinners) Insulin : As soon as you can eat, you may resume your normal dosing schedule. (Only for those taking insulin ) Medications: May resume pre-procedure medications.  Do not take any drugs, other than what has been prescribed to you. Infection prevention: Keep procedure site clean and dry. Post-procedure Pain Diary: Extremely important that this be done correctly and accurately. Recorded information will be used to determine the next step in treatment. Pain evaluated is that of treated area only. Do not include pain from an untreated area. Complete every hour, on the hour, for the initial 8 hours. Set an alarm to help you do this part accurately. Do not go to sleep and have it completed later. It will not be accurate. Follow-up appointment: Keep your follow-up appointment after the procedure. Usually 2-6 weeks after radiofrequency. Bring you pain diary. The information collected will be essential for your long-term care.   Expect: From numbing medicine (AKA: Local Anesthetics): Numbness or decrease in pain. Onset: Full effect within 15 minutes of injected. Duration: It will depend on the type of local anesthetic used. On the average, 1 to 8 hours.  From steroids (when added): Decrease in swelling or inflammation. Once inflammation is improved, relief of the pain will follow. Onset of benefits: Depends on the amount of swelling present. The more swelling, the longer it will take for the benefits to be seen. In some cases, up to 10 days. Duration: Steroids will stay in the system x 2 weeks. Duration of benefits will depend on multiple posibilities including persistent irritating factors. From procedure: Some discomfort is to be expected once the numbing medicine wears off. In the case  of radiofrequency procedures, this may last as long as 6-8 weeks. Additional post-procedure pain medication is provided for this. Discomfort is minimized if ice and heat are applied as instructed.  Call  if: You experience numbness and weakness that gets worse with time, as opposed to wearing off. He experience any unusual bleeding, difficulty breathing, or loss of the ability to control your bowel and bladder. (This applies to Spinal procedures only) You experience any redness, swelling, heat, red streaks, elevated temperature, fever, or any other signs of a possible infection.  Emergency Numbers: Durning business hours (Monday - Thursday, 8:00 AM - 4:00 PM) (Friday, 9:00 AM - 12:00 Noon): [phone] After hours: [phone] ______________________________________________________________________     ______________________________________________________________________    Steroid injections  Common steroids for injections Triamcinolone : Used by many sports medicine physicians for large joint and bursal injections, often combined with a local anesthetic like lidocaine . A study focusing on coccydynia (tailbone pain) found triamcinolone  was more effective than betamethasone , suggesting it may also be preferable for other localized inflammation conditions. Methylprednisolone: A common alternative to triamcinolone  that is also a strong anti-inflammatory. It is available in different formulations, with the acetate suspension being the long-acting option for intra-articular injections. Dexamethasone : This is a non-particulate steroid, meaning it has a lower risk of tissue damage compared to particulate steroids like triamcinolone  and methylprednisolone. While less common for this specific use, it is an option for targeted injections.   Considerations for physicians Particulate vs. non-particulate steroids: Triamcinolone  and methylprednisolone are particulate, meaning they can clump together. Dexamethasone  is non-particulate. Particulate steroids are often preferred for their longer-lasting effects but carry a theoretical higher risk for certain injections (though this is less of a concern in the  costochondral joints). Combined injectate: Corticosteroids are typically mixed with a local anesthetic like lidocaine  to provide both immediate pain relief (from the anesthetic) and longer-term inflammation reduction (from the steroid). Imaging guidance: To ensure accurate placement of the needle and medication, physicians may use ultrasound or fluoroscopic guidance for the injection, especially in complex or refractory cases.   Patient guidance Before undergoing a steroid injection, discuss the options with your physician. They will determine the best steroid, dosage, and procedure for your specific case based on factors like: Severity of your condition History of response to other treatments Your overall health status Experience and preference of the physician  Last  Updated: 08/11/2024 ______________________________________________________________________     ______________________________________________________________________    Opioid Pain Medication Update  To: All patients taking opioid pain medications. (I.e.: hydrocodone , hydromorphone , oxycodone , oxymorphone, morphine , codeine, methadone, tapentadol, tramadol , buprenorphine, fentanyl , etc.)  Re: Updated review of side effects and adverse reactions of opioid analgesics, as well as new information about long term effects of this class of medications.  Direct risks of long-term opioid therapy are not limited to opioid addiction and overdose. Potential medical risks include serious fractures, breathing problems during sleep, hyperalgesia, immunosuppression, chronic constipation, bowel obstruction, myocardial infarction, and tooth decay secondary to xerostomia.  Unpredictable adverse effects that can occur even if you take your medication correctly: Cognitive impairment, respiratory depression, and death. Most people think that if they take their medication correctly, and as instructed, that they will be safe. Nothing could be  farther from the truth. In reality, a significant amount of recorded deaths associated with the use of opioids has occurred in individuals that had taken the medication for a long time, and were taking their medication correctly. The following are examples of how this can happen: Patient taking  his/her medication for a long time, as instructed, without any side effects, is given a certain antibiotic or another unrelated medication, which in turn triggers a Drug-to-drug interaction leading to disorientation, cognitive impairment, impaired reflexes, respiratory depression or an untoward event leading to serious bodily harm or injury, including death.  Patient taking his/her medication for a long time, as instructed, without any side effects, develops an acute impairment of liver and/or kidney function. This will lead to a rapid inability of the body to breakdown and eliminate their pain medication, which will result in effects similar to an overdose, but with the same medicine and dose that they had always taken. This again may lead to disorientation, cognitive impairment, impaired reflexes, respiratory depression or an untoward event leading to serious bodily harm or injury, including death.  A similar problem will occur with patients as they grow older and their liver and kidney function begins to decrease as part of the aging process.  Background information: Historically, the original case for using long-term opioid therapy to treat chronic noncancer pain was based on safety assumptions that subsequent experience has called into question. In 1996, the American Pain Society and the American Academy of Pain Medicine issued a consensus statement supporting long-term opioid therapy. This statement acknowledged the dangers of opioid prescribing but concluded that the risk for addiction was low; respiratory depression induced by opioids was short-lived, occurred mainly in opioid-naive patients, and was  antagonized by pain; tolerance was not a common problem; and efforts to control diversion should not constrain opioid prescribing. This has now proven to be wrong. Experience regarding the risks for opioid addiction, misuse, and overdose in community practice has failed to support these assumptions.  According to the Centers for Disease Control and Prevention, fatal overdoses involving opioid analgesics have increased sharply over the past decade. Currently, more than 96,700 people die from drug overdoses every year. Opioids are a factor in 7 out of every 10 overdose deaths. Deaths from drug overdose have surpassed motor vehicle accidents as the leading cause of death for individuals between the ages of 68 and 69.  Clinical data suggest that neuroendocrine dysfunction may be very common in both men and women, potentially causing hypogonadism, erectile dysfunction, infertility, decreased libido, osteoporosis, and depression. Recent studies linked higher opioid dose to increased opioid-related mortality. Controlled observational studies reported that long-term opioid therapy may be associated with increased risk for cardiovascular events. Subsequent meta-analysis concluded that the safety of long-term opioid therapy in elderly patients has not been proven.   Side Effects and adverse reactions: Common side effects: Drowsiness (sedation). Dizziness. Nausea and vomiting. Constipation. Physical dependence -- Dependence often manifests with withdrawal symptoms when opioids are discontinued or decreased. Tolerance -- As you take repeated doses of opioids, you require increased medication to experience the same effect of pain relief. Respiratory depression -- This can occur in healthy people, especially with higher doses. However, people with COPD, asthma or other lung conditions may be even more susceptible to fatal respiratory impairment.  Uncommon side effects: An increased sensitivity to feeling pain and  extreme response to pain (hyperalgesia). Chronic use of opioids can lead to this. Delayed gastric emptying (the process by which the contents of your stomach are moved into your small intestine). Muscle rigidity. Immune system and hormonal dysfunction. Quick, involuntary muscle jerks (myoclonus). Arrhythmia. Itchy skin (pruritus). Dry mouth (xerostomia).  Long-term side effects: Chronic constipation. Sleep-disordered breathing (SDB). Increased risk of bone fractures. Hypothalamic-pituitary-adrenal dysregulation. Increased risk of overdose.  RISKS: Respiratory  depression and death: Opioids increase the risk of respiratory depression and death.  Drug-to-drug interactions: Opioids are relatively contraindicated in combination with benzodiazepines, sleep inducers, and other central nervous system depressants. Other classes of medications (i.e.: certain antibiotics and even over-the-counter medications) may also trigger or induce respiratory depression in some patients.  Medical conditions: Patients with pre-existing respiratory problems are at higher risk of respiratory failure and/or depression when in combination with opioid analgesics. Opioids are relatively contraindicated in some medical conditions such as central sleep apnea.   Fractures and Falls:  Opioids increase the risk and incidence of falls. This is of particular importance in elderly patients.  Endocrine System:  Long-term administration is associated with endocrine abnormalities (endocrinopathies). (Also known as Opioid-induced Endocrinopathy) Influences on both the hypothalamic-pituitary-adrenal axis?and the hypothalamic-pituitary-gonadal axis have been demonstrated with consequent hypogonadism and adrenal insufficiency in both sexes. Hypogonadism and decreased levels of dehydroepiandrosterone sulfate have been reported in men and women. Endocrine effects include: Amenorrhoea in women (abnormal absence of  menstruation) Reduced libido in both sexes Decreased sexual function Erectile dysfunction in men Hypogonadisms (decreased testicular function with shrinkage of testicles) Infertility Depression and fatigue Loss of muscle mass Anxiety Depression Immune suppression Hyperalgesia Weight gain Anemia Osteoporosis Patients (particularly women of childbearing age) should avoid opioids. There is insufficient evidence to recommend routine monitoring of asymptomatic patients taking opioids in the long-term for hormonal deficiencies.  Immune System: Human studies have demonstrated that opioids have an immunomodulating effect. These effects are mediated via opioid receptors both on immune effector cells and in the central nervous system. Opioids have been demonstrated to have adverse effects on antimicrobial response and anti-tumour surveillance. Buprenorphine has been demonstrated to have no impact on immune function.  Opioid Induced Hyperalgesia: Human studies have demonstrated that prolonged use of opioids can lead to a state of abnormal pain sensitivity, sometimes called opioid induced hyperalgesia (OIH). Opioid induced hyperalgesia is not usually seen in the absence of tolerance to opioid analgesia. Clinically, hyperalgesia may be diagnosed if the patient on long-term opioid therapy presents with increased pain. This might be qualitatively and anatomically distinct from pain related to disease progression or to breakthrough pain resulting from development of opioid tolerance. Pain associated with hyperalgesia tends to be more diffuse than the pre-existing pain and less defined in quality. Management of opioid induced hyperalgesia requires opioid dose reduction.  Cancer: Chronic opioid therapy has been associated with an increased risk of cancer among noncancer patients with chronic pain. This association was more evident in chronic strong opioid users. Chronic opioid consumption causes  significant pathological changes in the small intestine and colon. Epidemiological studies have found that there is a link between opium  dependence and initiation of gastrointestinal cancers. Cancer is the second leading cause of death after cardiovascular disease. Chronic use of opioids can cause multiple conditions such as GERD, immunosuppression and renal damage as well as carcinogenic effects, which are associated with the incidence of cancers.   Mortality: Long-term opioid use has been associated with increased mortality among patients with chronic non-cancer pain (CNCP).  Prescription of long-acting opioids for chronic noncancer pain was associated with a significantly increased risk of all-cause mortality, including deaths from causes other than overdose.  Reference: Von Korff M, Kolodny A, Deyo RA, Chou R. Long-term opioid therapy reconsidered. Ann Intern Med. 2011 Sep 6;155(5):325-8. doi: 10.7326/[id number]. PMID: 78106373; PMCID: EFR6719914. Kit JINNY Laurence CINDERELLA Pearley JINNY, Hayward RA, Dunn KM, Jordan KP. Risk of adverse events in patients prescribed long-term opioids: A cohort study in  the [place] Clinical Practice Research [employer]. Eur J Pain. 2019 May;23(5):908-922. doi: 10.1002/ejp.1357. Epub 2019 Jan 31. PMID: 69379883. Colameco S, Coren JS, Ciervo CA. Continuous opioid treatment for chronic noncancer pain: a time for moderation in prescribing. Postgrad Med. 2009 Jul;121(4):61-6. doi: 10.3810/pgm.2009.07.2032. PMID: 80358728. Gigi JONELLE Shlomo MILUS Levern IVER Conny RN, [place], Blazina I, Lonell DASEN, Bougatsos C, Deyo RA. The effectiveness and risks of long-term opioid therapy for chronic pain: a systematic review for a [employer] of [employer]ways to [employer]. Ann Intern Med. 2015 Feb 17;162(4):276-86. doi: 10.7326/M14-2559. PMID: 74418742. Rory CHRISTELLA Door [facility], [place] DM. NCHS Data Brief No. 22. [place]: Centers for Disease Control and Prevention; 2009. Sep,  Increase in Fatal Poisonings Involving Opioid Analgesics in the [place] , 1999-2006. Song IA, Choi HR, Oh TK. Long-term opioid use and mortality in patients with chronic non-cancer pain: Ten-year follow-up study in [place] from 2010 through 2019. EClinicalMedicine. 2022 Jul 18;51:101558. doi: 10.1016/j.eclinm.2022.898441. PMID: 64124182; PMCID: EFR0695089. Huser, W., Schubert, T., Vogelmann, T. et al. All-cause mortality in patients with long-term opioid therapy compared with non-opioid analgesics for chronic non-cancer pain: a database study. BMC Med 18, 162 (2020). http://lester.info/ Rashidian H, Zendehdel K, Kamangar F, Malekzadeh R, Haghdoost AA. An Ecological Study of the Association between Opiate Use and Incidence of Cancers. Addict Health. 2016 Fall;8(4):252-260. PMID: 71180443; PMCID: EFR4445194.  Our Goal: Our goal is to control your pain with means other than the use of opioid pain medications.  Our Recommendation: Talk to your physician about coming off of these medications. We can assist you with the tapering down and stopping these medicines. Based on the new information, even if you cannot completely stop the medication, a decrease in the dose may be associated with a lesser risk. Ask for other means of controlling the pain. Decrease or eliminate those factors that significantly contribute to your pain such as smoking, obesity, and a diet heavily tilted towards inflammatory nutrients.  Last Updated: 06/24/2023   ______________________________________________________________________       ______________________________________________________________________    Procedure instructions  Stop blood-thinners  Do not eat or drink fluids (other than water ) for 8 hours before your procedure  No water  for 2 hours before your procedure  Take your blood pressure medicine with a sip of water   Arrive 30 minutes before your appointment  If  sedation is planned, bring suitable driver. [place], [place], & public transportation are NOT APPROVED)  Carefully read the Preparing for your procedure detailed instructions  If you have questions call us  at [phone]  Procedure appointments are for procedures only.   NO medication refills or new problem evaluations will be done on procedure days.   Only the scheduled, pre-approved procedure and side will be done.   ______________________________________________________________________     ______________________________________________________________________    Preparing for your procedure  Appointments: If you think you may not be able to keep your appointment, call 24-48 hours in advance to cancel. We need time to make it available to others.  Procedure visits are for procedures only. During your procedure appointment there will be: NO Prescription Refills*. NO medication changes or discussions*. NO discussion of disability issues*. NO unrelated pain problem evaluations*. NO evaluations to order other pain procedures*. *These will be addressed at a separate and distinct evaluation encounter on the provider's evaluation schedule and not during procedure days.  Instructions: Food intake: Avoid eating anything solid for at least 8 hours prior to your procedure. Clear liquid intake: You may take clear liquids such  as water  up to 2 hours prior to your procedure. (No carbonated drinks. No soda.) Transportation: Unless otherwise stated by your physician, bring a driver. (Driver cannot be a Market Researcher, Pharmacist, Community, or any other form of public transportation.) Morning Medicines: Except for blood thinners, take all of your other morning medications with a sip of water . Make sure to take your heart and blood pressure medicines. If your blood pressure's lower number is above 100, the case will be rescheduled. Blood thinners: Make sure to stop your blood thinners as instructed.  If you take a blood  thinner, but were not instructed to stop it, call our office [phone] and ask to talk to a nurse. Not stopping a blood thinner prior to certain procedures could lead to serious complications. Diabetics on insulin : Notify the staff so that you can be scheduled 1st case in the morning. If your diabetes requires high dose insulin , take only  of your normal insulin  dose the morning of the procedure and notify the staff that you have done so. Preventing infections: Shower with an antibacterial soap the morning of your procedure.  Build-up your immune system: Take 1000 mg of Vitamin C with every meal (3 times a day) the day prior to your procedure. Antibiotics: Inform the nursing staff if you are taking any antibiotics or if you have any conditions that may require antibiotics prior to procedures. (Example: recent joint implants)   Pregnancy: If you are pregnant make sure to notify the nursing staff. Not doing so may result in injury to the fetus, including death.  Sickness: If you have a cold, fever, or any active infections, call and cancel or reschedule your procedure. Receiving steroids while having an infection may result in complications. Arrival: You must be in the facility at least 30 minutes prior to your scheduled procedure. Tardiness: Your scheduled time is also the cutoff time. If you do not arrive at least 15 minutes prior to your procedure, you will be rescheduled.  Children: Do not bring any children with you. Make arrangements to keep them home. Dress appropriately: There is always a possibility that your clothing may get soiled. Avoid long dresses. Valuables: Do not bring any jewelry or valuables.  Reasons to call and reschedule or cancel your procedure: (Following these recommendations will minimize the risk of a serious complication.) Surgeries: Avoid having procedures within 2 weeks of any surgery. (Avoid for 2 weeks before or after any surgery). Flu Shots: Avoid having procedures  within 2 weeks of a flu shots or . (Avoid for 2 weeks before or after immunizations). Barium: Avoid having a procedure within 7-10 days after having had a radiological study involving the use of radiological contrast. (Myelograms, Barium swallow or enema study). Heart attacks: Avoid any elective procedures or surgeries for the initial 6 months after a Myocardial Infarction (Heart Attack). Blood thinners: It is imperative that you stop these medications before procedures. Let us  know if you if you take any blood thinner.  Infection: Avoid procedures during or within two weeks of an infection (including chest colds or gastrointestinal problems). Symptoms associated with infections include: Localized redness, fever, chills, night sweats or profuse sweating, burning sensation when voiding, cough, congestion, stuffiness, runny nose, sore throat, diarrhea, nausea, vomiting, cold or Flu symptoms, recent or current infections. It is specially important if the infection is over the area that we intend to treat. Heart and lung problems: Symptoms that may suggest an active cardiopulmonary problem include: cough, chest pain, breathing difficulties or shortness of  breath, dizziness, ankle swelling, uncontrolled high or unusually low blood pressure, and/or palpitations. If you are experiencing any of these symptoms, cancel your procedure and contact your primary care physician for an evaluation.  Remember:  Regular Business hours are:  Monday to Thursday 8:00 AM to 4:00 PM  Provider's Schedule: Eric Como, MD:  Procedure days: Tuesday and Thursday 7:30 AM to 4:00 PM  Wallie Sherry, MD:  Procedure days: Monday and Wednesday 7:30 AM to 4:00 PM Last  Updated: 11/26/2023 ______________________________________________________________________     ______________________________________________________________________    General Risks and Possible Complications  Patient Responsibilities: It is important  that you read this as it is part of your informed consent. It is our duty to inform you of the risks and possible complications associated with treatments offered to you. It is your responsibility as a patient to read this and to ask questions about anything that is not clear or that you believe was not covered in this document.  Patients Rights: You have the right to refuse treatment. You also have the right to change your mind, even after initially having agreed to have the treatment done. However, under this last option, if you wait until the last second to change your mind, you may be charged for the materials used up to that point.  Introduction: Medicine is not an visual merchandiser. Everything in Medicine, including the lack of treatment(s), carries the potential for danger, harm, or loss (which is by definition: Risk). In Medicine, a complication is a secondary problem, condition, or disease that can aggravate an already existing one. All treatments carry the risk of possible complications. The fact that a side effects or complications occurs, does not imply that the treatment was conducted incorrectly. It must be clearly understood that these can happen even when everything is done following the highest safety standards.  No treatment: You can choose not to proceed with the proposed treatment alternative. The PRO(s) would include: avoiding the risk of complications associated with the therapy. The CON(s) would include: not getting any of the treatment benefits. These benefits fall under one of three categories: diagnostic; therapeutic; and/or palliative. Diagnostic benefits include: getting information which can ultimately lead to improvement of the disease or symptom(s). Therapeutic benefits are those associated with the successful treatment of the disease. Finally, palliative benefits are those related to the decrease of the primary symptoms, without necessarily curing the condition (example:  decreasing the pain from a flare-up of a chronic condition, such as incurable terminal cancer).  General Risks and Complications: These are associated to most interventional treatments. They can occur alone, or in combination. They fall under one of the following six (6) categories: no benefit or worsening of symptoms; bleeding; infection; nerve damage; allergic reactions; and/or death. No benefits or worsening of symptoms: In Medicine there are no guarantees, only probabilities. No healthcare provider can ever guarantee that a medical treatment will work, they can only state the probability that it may. Furthermore, there is always the possibility that the condition may worsen, either directly, or indirectly, as a consequence of the treatment. Bleeding: This is more common if the patient is taking a blood thinner, either prescription or over the counter (example: Goody Powders, Fish oil, Aspirin , Garlic, etc.), or if suffering a condition associated with impaired coagulation (example: Hemophilia, cirrhosis of the liver, low platelet counts, etc.). However, even if you do not have one on these, it can still happen. If you have any of these conditions, or take one of these  drugs, make sure to notify your treating physician. Infection: This is more common in patients with a compromised immune system, either due to disease (example: diabetes, cancer, human immunodeficiency virus [HIV], etc.), or due to medications or treatments (example: therapies used to treat cancer and rheumatological diseases). However, even if you do not have one on these, it can still happen. If you have any of these conditions, or take one of these drugs, make sure to notify your treating physician. Nerve Damage: This is more common when the treatment is an invasive one, but it can also happen with the use of medications, such as those used in the treatment of cancer. The damage can occur to small secondary nerves, or to large primary  ones, such as those in the spinal cord and brain. This damage may be temporary or permanent and it may lead to impairments that can range from temporary numbness to permanent paralysis and/or brain death. Allergic Reactions: Any time a substance or material comes in contact with our body, there is the possibility of an allergic reaction. These can range from a mild skin rash (contact dermatitis) to a severe systemic reaction (anaphylactic reaction), which can result in death. Death: In general, any medical intervention can result in death, most of the time due to an unforeseen complication. ______________________________________________________________________      "

## 2025-01-14 NOTE — Progress Notes (Signed)
 PROVIDER NOTE: Interpretation of information contained herein should be left to medically-trained personnel. Specific patient instructions are provided elsewhere under Patient Instructions section of medical record. This document was created in part using STT-dictation technology, any transcriptional errors that may result from this process are unintentional.  Patient: Julie Acevedo Type: Established DOB: [birth year] MRN: [id number] PCP: Sharma Coyer, MD  Service: Procedure DOS: 01/14/2025 Setting: Ambulatory Location: Ambulatory outpatient facility Delivery: Face-to-face Provider: Eric DELENA Como, MD Specialty: Interventional Pain Management Specialty designation: 09 Location: Outpatient facility Ref. Prov.: Simmons-Robinson, Makiera, MD       Interventional Therapy   Procedure: Lumbar Facet, Medial Branch Radiofrequency Ablation (RFA) #1  Laterality: Right (-RT)  Level: L2, L3, L4, L5, and S1 Medial Branch Level(s). These levels will denervate the L3-4, L4-5, and L5-S1 lumbar facet joints.  Imaging: Fluoroscopy-guided Spinal ([id number]) Anesthesia: Local anesthesia (1-2% Lidocaine ) Anxiolysis: IV Versed  2.0 mg            Analgesia: No Sedation Fentanyl  1.5 mL (75 mcg) DOS: 01/14/2025  Performed by: Eric DELENA Como, MD  Purpose: Therapeutic/Palliative Indications: Low back pain severe enough to impact quality of life or function. Indications: 1. Chronic low back pain (1ry area of Pain) (Bilateral) (R>L) w/o sciatica   2. Chronic low back pain of over 3 months duration   3. 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 Lumbar facet arthropathy   5. Lumbar facet hypertrophy (Multilevel) (Bilateral)   6. Lumbar facet joint pain   7. Lumbar facet joint syndrome   8. Spondylosis without myelopathy or radiculopathy, lumbosacral region   9. Degeneration of intervertebral disc of lumbar region, unspecified whether pain present   10. Abnormal MRI,  lumbar spine (09/25/2023)    Julie Acevedo has been dealing with the above chronic pain for longer than three months and has either failed to respond, was unable to tolerate, or simply did not get enough benefit from other more conservative therapies including, but not limited to: 1. Over-the-counter medications 2. Anti-inflammatory medications 3. Muscle relaxants 4. Membrane stabilizers 5. Opioids 6. Physical therapy and/or chiropractic manipulation 7. Modalities (Heat, ice, etc.) 8. Invasive techniques such as nerve blocks. Julie Acevedo has attained more than 50% relief of the pain from a series of diagnostic injections conducted in separate occasions.  Pain Score: Pre-procedure: 9 /10 Post-procedure: 0-No pain/10     Position / Prep / Materials:  Position: Prone  Prep solution: ChloraPrep (2% chlorhexidine  gluconate and 70% isopropyl alcohol) Prep Area: Entire Lumbosacral Region (Lower back from mid-thoracic region to end of tailbone and from flank to flank.) Materials:  Tray: RFA (Radiofrequency) tray Needle(s):  Type: RFA (Teflon-coated radiofrequency ablation needles) Gauge (G): 22  Length: Regular (10cm) Qty: 5     H&P (Pre-op Assessment):  Julie Acevedo is a 74 y.o. (year old), female patient, seen today for interventional treatment. She  has a past surgical history that includes Hemicolectomy; Lumbar disc surgery; Roux-en-Y Gastric Bypass (2010); Cholecystectomy; Eye surgery; Hand surgery; Foot surgery; Abdominal hysterectomy; Tonsillectomy; Upper gi endoscopy (01/12/2016); Cardiac catheterization (2000); Laparoscopic lysis of adhesions (N/A, 08/21/2016); Ventral hernia repair (N/A, 08/21/2016); Insertion of mesh (N/A, 08/21/2016); Cystocele repair (N/A, 03/19/2017); Cystoscopy (N/A, 03/19/2017); Laparoscopic removal abdominal mass; LOOP RECORDER INSERTION (N/A, 03/21/2018); laparotomy (N/A, 04/16/2019); XI robotic assisted ventral hernia (N/A, 12/08/2019); Insertion of mesh (N/A,  12/08/2019); Breast excisional biopsy (Right, 1971); Small intestine surgery; and Esophagogastroduodenoscopy (egd) with propofol  (N/A, 12/15/2021). Julie Acevedo has a current medication list which includes the following  prescription(s): accu-chek guide, accu-chek softclix lancets, atorvastatin , blood glucose meter kit and supplies, calcitriol , diazoxide , diphenhydramine , diphenhydramine , docusate sodium , gabapentin , hydrochlorothiazide , hydroxyzine , ibuprofen, levetiracetam , linaclotide , meclizine , metoprolol  succinate, mirtazapine , omeprazole , ondansetron , oxycodone , prednisone , rivastigmine , rosuvastatin , tramadol , [START ON 01/21/2025] tramadol , and triamcinolone  cream, and the following Facility-Administered Medications: fentanyl . Her primarily concern today is the Back Pain  Initial Vital Signs:  Pulse/HCG Rate: 69ECG Heart Rate: (!) 55 Temp: 98.1 F (36.7 C) Resp: 18 BP: 136/76 SpO2: 98 %  BMI: Estimated body mass index is 21.79 kg/m as calculated from the following:   Height as of this encounter: 5' 3 (1.6 m).   Weight as of this encounter: 123 lb (55.8 kg).  Risk Assessment: Allergies: Reviewed. She is allergic to lotrel [amlodipine  besy-benazepril hcl], contrast media [iodinated contrast media], niacin, and sulfa antibiotics.  Allergy Precautions: None required Coagulopathies: Reviewed. None identified.  Blood-thinner therapy: None at this time Active Infection(s): Reviewed. None identified. Julie Acevedo is afebrile  Site Confirmation: Julie Acevedo was asked to confirm the procedure and laterality before marking the site Procedure checklist: Completed Consent: Before the procedure and under the influence of no sedative(s), amnesic(s), or anxiolytics, the patient was informed of the treatment options, risks and possible complications. To fulfill our ethical and legal obligations, as recommended by the American Medical Association's Code of Ethics, I have informed the patient of my clinical  impression; the nature and purpose of the treatment or procedure; the risks, benefits, and possible complications of the intervention; the alternatives, including doing nothing; the risk(s) and benefit(s) of the alternative treatment(s) or procedure(s); and the risk(s) and benefit(s) of doing nothing. The patient was provided information about the general risks and possible complications associated with the procedure. These may include, but are not limited to: failure to achieve desired goals, infection, bleeding, organ or nerve damage, allergic reactions, paralysis, and death. In addition, the patient was informed of those risks and complications associated to Spine-related procedures, such as failure to decrease pain; infection (i.e.: Meningitis, epidural or intraspinal abscess); bleeding (i.e.: epidural hematoma, subarachnoid hemorrhage, or any other type of intraspinal or peri-dural bleeding); organ or nerve damage (i.e.: Any type of peripheral nerve, nerve root, or spinal cord injury) with subsequent damage to sensory, motor, and/or autonomic systems, resulting in permanent pain, numbness, and/or weakness of one or several areas of the body; allergic reactions; (i.e.: anaphylactic reaction); and/or death. Furthermore, the patient was informed of those risks and complications associated with the medications. These include, but are not limited to: allergic reactions (i.e.: anaphylactic or anaphylactoid reaction(s)); adrenal axis suppression; blood sugar elevation that in diabetics may result in ketoacidosis or comma; water  retention that in patients with history of congestive heart failure may result in shortness of breath, pulmonary edema, and decompensation with resultant heart failure; weight gain; swelling or edema; medication-induced neural toxicity; particulate matter embolism and blood vessel occlusion with resultant organ, and/or nervous system infarction; and/or aseptic necrosis of one or more  joints. Finally, the patient was informed that Medicine is not an exact science; therefore, there is also the possibility of unforeseen or unpredictable risks and/or possible complications that may result in a catastrophic outcome. The patient indicated having understood very clearly. We have given the patient no guarantees and we have made no promises. Enough time was given to the patient to ask questions, all of which were answered to the patient's satisfaction. Julie Acevedo has indicated that she wanted to continue with the procedure. Attestation: I, the ordering provider, attest that I have  discussed with the patient the benefits, risks, side-effects, alternatives, likelihood of achieving goals, and potential problems during recovery for the procedure that I have provided informed consent. Date  Time: 01/14/2025  8:16 AM  Pre-Procedure Preparation:  Monitoring: As per clinic protocol. Respiration, ETCO2, SpO2, BP, heart rate and rhythm monitor placed and checked for adequate function Safety Precautions: Patient was assessed for positional comfort and pressure points before starting the procedure. Time-out: I initiated and conducted the Time-out before starting the procedure, as per protocol. The patient was asked to participate by confirming the accuracy of the Time Out information. Verification of the correct person, site, and procedure were performed and confirmed by me, the nursing staff, and the patient. Time-out conducted as per Joint Commission's Universal Protocol (UP.01.01.01). Time: 0844 Start Time: 0844 hrs.  Description of Procedure:          Laterality: See above. Levels:  See above. Safety Precautions: Aspiration looking for blood return was conducted prior to all injections. At no point did we inject any substances, as a needle was being advanced. Before injecting, the patient was told to immediately notify me if she was experiencing any new onset of ringing in the ears, or  metallic taste in the mouth. No attempts were made at seeking any paresthesias. Safe injection practices and needle disposal techniques used. Medications properly checked for expiration dates. SDV (single dose vial) medications used. After the completion of the procedure, all disposable equipment used was discarded in the proper designated medical waste containers. Local Anesthesia: Protocol guidelines were followed. The patient was positioned over the fluoroscopy table. The area was prepped in the usual manner. The time-out was completed. The target area was identified using fluoroscopy. A 12-in long, straight, sterile hemostat was used with fluoroscopic guidance to locate the targets for each level blocked. Once located, the skin was marked with an approved surgical skin marker. Once all sites were marked, the skin (epidermis, dermis, and hypodermis), as well as deeper tissues (fat, connective tissue and muscle) were infiltrated with a small amount of a short-acting local anesthetic, loaded on a 10cc syringe with a 25G, 1.5-in  Needle. An appropriate amount of time was allowed for local anesthetics to take effect before proceeding to the next step. Technical description of process:  Radiofrequency Ablation (RFA) L2 Medial Branch Nerve RFA: The target area for the L2 medial branch is at the junction of the postero-lateral aspect of the superior articular process and the superior, posterior, and medial edge of the transverse process of L3. Under fluoroscopic guidance, a Radiofrequency needle was inserted until contact was made with os over the superior postero-lateral aspect of the pedicular shadow (target area). Sensory and motor testing was conducted to properly adjust the position of the needle. Once satisfactory placement of the needle was achieved, the numbing solution was slowly injected after negative aspiration for blood. 2.0 mL of the nerve block solution was injected without difficulty or  complication. After waiting for at least 3 minutes, the ablation was performed. Once completed, the needle was removed intact. L3 Medial Branch Nerve RFA: The target area for the L3 medial branch is at the junction of the postero-lateral aspect of the superior articular process and the superior, posterior, and medial edge of the transverse process of L4. Under fluoroscopic guidance, a Radiofrequency needle was inserted until contact was made with os over the superior postero-lateral aspect of the pedicular shadow (target area). Sensory and motor testing was conducted to properly adjust the position of the  needle. Once satisfactory placement of the needle was achieved, the numbing solution was slowly injected after negative aspiration for blood. 2.0 mL of the nerve block solution was injected without difficulty or complication. After waiting for at least 3 minutes, the ablation was performed. Once completed, the needle was removed intact. L4 Medial Branch Nerve RFA: The target area for the L4 medial branch is at the junction of the postero-lateral aspect of the superior articular process and the superior, posterior, and medial edge of the transverse process of L5. Under fluoroscopic guidance, a Radiofrequency needle was inserted until contact was made with os over the superior postero-lateral aspect of the pedicular shadow (target area). Sensory and motor testing was conducted to properly adjust the position of the needle. Once satisfactory placement of the needle was achieved, the numbing solution was slowly injected after negative aspiration for blood. 2.0 mL of the nerve block solution was injected without difficulty or complication. After waiting for at least 3 minutes, the ablation was performed. Once completed, the needle was removed intact. L5 Medial Branch Nerve RFA: The target area for the L5 medial branch is at the junction of the postero-lateral aspect of the superior articular process of S1 and the  superior, posterior, and medial edge of the sacral ala. Under fluoroscopic guidance, a Radiofrequency needle was inserted until contact was made with os over the superior postero-lateral aspect of the pedicular shadow (target area). Sensory and motor testing was conducted to properly adjust the position of the needle. Once satisfactory placement of the needle was achieved, the numbing solution was slowly injected after negative aspiration for blood. 2.0 mL of the nerve block solution was injected without difficulty or complication. After waiting for at least 3 minutes, the ablation was performed. Once completed, the needle was removed intact. S1 Medial Branch Nerve RFA: The target area for the S1 medial branch is located inferior to the junction of the S1 superior articular process and the L5 inferior articular process, posterior, inferior, and lateral to the 6 o'clock position of the L5-S1 facet joint, just superior to the S1 posterior foramen. Under fluoroscopic guidance, the Radiofrequency needle was advanced until contact was made with os over the Target area. Sensory and motor testing was conducted to properly adjust the position of the needle. Once satisfactory placement of the needle was achieved, the numbing solution was slowly injected after negative aspiration for blood. 2.0 mL of the nerve block solution was injected without difficulty or complication. After waiting for at least 3 minutes, the ablation was performed. Once completed, the needle was removed intact. Radiofrequency lesioning (ablation):  Radiofrequency Generator: Medtronic AccurianTM AG 1000 RF Generator Sensory Stimulation Parameters: 50 Hz was used to locate & identify the nerve, making sure that the needle was positioned such that there was no sensory stimulation below 0.3 V or above 0.7 V. Motor Stimulation Parameters: 2 Hz was used to evaluate the motor component. Care was taken not to lesion any nerves that demonstrated motor  stimulation of the lower extremities at an output of less than 2.5 times that of the sensory threshold, or a maximum of 2.0 V. Lesioning Technique Parameters: Standard Radiofrequency settings. (Not bipolar or pulsed.) Temperature Settings: 80 degrees C Lesioning time: 60 seconds Stationary intra-operative compliance: Compliant  Once the entire procedure was completed, the treated area was cleaned, making sure to leave some of the prepping solution back to take advantage of its long term bactericidal properties.    Illustration of the posterior view of the  lumbar spine and the posterior neural structures. Laminae of L2 through S1 are labeled. DPRL5, dorsal primary ramus of L5; DPRS1, dorsal primary ramus of S1; DPR3, dorsal primary ramus of L3; FJ, facet (zygapophyseal) joint L3-L4; I, inferior articular process of L4; LB1, lateral branch of dorsal primary ramus of L1; IAB, inferior articular branches from L3 medial branch (supplies L4-L5 facet joint); IBP, intermediate branch plexus; MB3, medial branch of dorsal primary ramus of L3; NR3, third lumbar nerve root; S, superior articular process of L5; SAB, superior articular branches from L4 (supplies L4-5 facet joint also); TP3, transverse process of L3.  Facet Joint Innervation (* possible contribution)  L1-2 T12, L1 (L2*)  Medial Branch  L2-3 L1, L2 (L3*)                     L3-4 L2, L3 (L4*)                     L4-5 L3, L4 (L5*)                     L5-S1 L4, L5, S1                        Vitals:   01/14/25 0910 01/14/25 0915 01/14/25 0917 01/14/25 0926  BP: (!) 156/82 118/74 125/75 (!) 151/72  Pulse:    60  Resp: 10 13 10 16   Temp:      SpO2: 95% 96% 98% 94%  Weight:      Height:        Start Time: 0844 hrs. End Time: 0916 hrs.  Imaging Guidance (Spinal):         Type of Imaging Technique: Fluoroscopy Guidance (Spinal) Indication(s): Fluoroscopy guidance for needle placement to enhance accuracy in procedures requiring  precise needle localization for targeted delivery of medication in or near specific anatomical locations not easily accessible without such real-time imaging assistance. Exposure Time: Please see nurses notes. Contrast: None used. Fluoroscopic Guidance: I was personally present during the use of fluoroscopy. Tunnel Vision Technique used to obtain the best possible view of the target area. Parallax error corrected before commencing the procedure. Direction-depth-direction technique used to introduce the needle under continuous pulsed fluoroscopy. Once target was reached, antero-posterior, oblique, and lateral fluoroscopic projection used confirm needle placement in all planes. Images permanently stored in EMR. Interpretation: No contrast injected. I personally interpreted the imaging intraoperatively. Adequate needle placement confirmed in multiple planes. Permanent images saved into the patient's record.  Antibiotic Prophylaxis:   Anti-infectives (From admission, onward)    None      Indication(s): None identified  Post-operative Assessment:  Post-procedure Vital Signs:  Pulse/HCG Rate: 60(!) 58 Temp: 98.1 F (36.7 C) Resp: 16 BP: (!) 151/72 SpO2: 94 %  EBL: None  Complications: No immediate post-treatment complications observed by team, or reported by patient.  Note: The patient tolerated the entire procedure well. A repeat set of vitals were taken after the procedure and the patient was kept under observation following institutional policy, for this type of procedure. Post-procedural neurological assessment was performed, showing return to baseline, prior to discharge. The patient was provided with post-procedure discharge instructions, including a section on how to identify potential problems. Should any problems arise concerning this procedure, the patient was given instructions to immediately contact us , at any time, without hesitation. In any case, we plan to contact the  patient by telephone for a follow-up status report  regarding this interventional procedure.  Comments:  No additional relevant information.  Plan of Care (POC)  Orders:  Orders Placed This Encounter  Procedures   Radiofrequency,Lumbar    Scheduling Instructions:     Side(s): Right-sided     Level: L3-4, L4-5, and L5-S1 Facets (L2, L3, L4, L5, and S1 Medial Branch)     Procedural Analgesia/Anxiolysis: Patient's choice     Date: 01/14/2025    Where will this procedure be performed?:   [facility]   Radiofrequency,Lumbar    Standing Status:   Future    Expiration Date:   04/14/2025    Scheduling Instructions:     Side(s): Left-sided     Level: L3-4, L4-5, and L5-S1 Facets (L2, L3, L4, L5, and S1 Medial Branch)     Procedural Analgesia/Anxiolysis: Patient's choice     Timeframe: 2 weeks from now.    Where will this procedure be performed?:   [facility]   DG PAIN CLIN[facility] C-ARM 1-60 MIN NO REPORT    Intraoperative interpretation by procedural physician at [facility].    Standing Status:   Standing    Number of Occurrences:   1    Reason for exam::   Assistance in needle guidance and placement for procedures requiring needle placement in or near specific anatomical locations not easily accessible without such assistance.   Informed Consent Details: Physician/Practitioner Attestation; Transcribe to consent form and obtain patient signature    Nursing Order: Transcribe to consent form and obtain patient signature. Note: Always confirm laterality of pain with Ms. Ann, before procedure.    Physician/Practitioner attestation of informed consent for procedure/surgical case:   I, the physician/practitioner, attest that I have discussed with the patient the benefits, risks, side effects, alternatives, likelihood of achieving goals and potential problems during recovery for the procedure that I have provided informed consent.    Procedure:   Lumbar Facet Radiofrequency  Ablation    Physician/Practitioner performing the procedure:   Haylynn Pha A. Tanya, MD    Indication/Reason:   Low Back Pain, with our without leg pain, due to Facet Joint Arthralgia (Joint Pain) known as Lumbar Facet Syndrome, secondary to Lumbar, and/or Lumbosacral Spondylosis (Arthritis of the Spine), without myelopathy or radiculopathy (Nerve Damage).   Provide equipment / supplies at bedside    Procedure tray: Radiofrequency Tray Additional material: Large hemostat (x1); Small hemostat (x1); Towels (x8); 4x4 sterile sponge pack (x1) Needle type: Teflon-coated Radiofrequency Needle (Disposable  single use) Size: Regular Quantity: 5    Standing Status:   Standing    Number of Occurrences:   1    Specify:   Radiofrequency Tray   Saline lock IV    Have LR [phone] available and administer at 125 mL/hr if patient becomes hypotensive.    Standing Status:   Standing    Number of Occurrences:   1     Opioid Analgesic(s):  No chronic opioid analgesics therapy prescribed by our practice. None MME/day: 0 mg/day    Medications ordered for procedure: Meds ordered this encounter  Medications   lidocaine  (XYLOCAINE ) 2 % (with pres) injection 400 mg   pentafluoroprop-tetrafluoroeth (GEBAUERS) aerosol   midazolam  PF (VERSED ) injection 0.5-2 mg    Make sure Flumazenil is available in the pyxis when using this medication. If oversedation occurs, administer 0.2 mg IV over 15 sec. If after 45 sec no response, administer 0.2 mg again over 1 min; may repeat at 1 min intervals; not to exceed 4 doses (  1 mg)   fentaNYL  (SUBLIMAZE ) injection 25-50 mcg    Make sure Narcan is available in the pyxis when using this medication. In the event of respiratory depression (RR< 8/min): Titrate NARCAN (naloxone) in increments of 0.1 to 0.2 mg IV at 2-3 minute intervals, until desired degree of reversal.   ropivacaine  (PF) 2 mg/mL (0.2%) (NAROPIN ) injection 9 mL   triamcinolone  acetonide (KENALOG -40) injection  40 mg   traMADol  (ULTRAM ) 50 MG tablet    Sig: Take 1 tablet (50 mg total) by mouth every 6 (six) hours as needed for up to 7 days for severe pain (pain score 7-10). Must last 7 days.    Dispense:  28 tablet    Refill:  0    For acute post-operative pain. Not to be refilled.  Must last 7 days.   traMADol  (ULTRAM ) 50 MG tablet    Sig: Take 1 tablet (50 mg total) by mouth every 6 (six) hours as needed for up to 7 days for severe pain (pain score 7-10). Must last 7 days.    Dispense:  28 tablet    Refill:  0    For acute post-operative pain. Not to be refilled.  Must last 7 days.   Medications administered: We administered lidocaine , pentafluoroprop-tetrafluoroeth, midazolam  PF, fentaNYL , ropivacaine  (PF) 2 mg/mL (0.2%), and triamcinolone  acetonide.  See the medical record for exact dosing, route, and time of administration.    Interventional Therapies  Risk Factors  Considerations:  Cardiologist: Cara Lovelace, MD ([facility])  Neurologist: Jannett Fairly, MD [facility] neurology) Hx of MI (1995)  Hx of TIAs  T2DM  Stage 3b CKD  [name]  Memory impairment  Hx of Bowel Obstruction (04/2019)  GERD  s/p Gastric Bypass for obesity  Malnutrition-malabsorption  Hx. Seizures      Planned  Pending:   Therapeutic left lumbar facet RFA #1    Under consideration:   Therapeutic bilateral lumbar facet RFA #1    Completed:   Therapeutic right lumbar facet RFA x1 (01/14/2025)  Therapeutic IM Robaxin  200 mg (01/04/2025)  Therapeutic bilateral lumbar facet MBB x2 (09/10/2023) (100/100/100/100)  Therapeutic left caudal ESI x2 (contrast allergy protocol ordered) (05/05/2024) (100/100/50/LE:100%LBP:50%)   Therapeutic  Palliative (PRN) options:   Therapeutic left caudal ESI (for lower extremity pain)  Therapeutic bilateral lumbar facet MBB (for low back pain)    Completed by other providers:   Therapeutic/diagnostic right L5 TFESI x2 (04/11/2016, 05/02/2016) by Morene Falcon, DO [facility])  Therapeutic/diagnostic right S1 TFESI x2 (04/11/2016, 05/02/2016) by Morene Falcon, DO [facility])  Therapeutic/diagnostic right L3 TFESI x1 (07/19/2016) by Morene Falcon, DO [facility])       Follow-up plan:   Return in about 2 weeks (around 01/28/2025) for [facility]): (L) L-FCT RFA #1.     Recent Visits Date Type Provider Dept  01/04/25 Office Visit Tanya Glisson, MD [facility]  Showing recent visits within past 90 days and meeting all other requirements Today's Visits Date Type Provider Dept  01/14/25 Procedure visit Tanya Glisson, MD [facility]  Showing today's visits and meeting all other requirements Future Appointments Date Type Provider Dept  01/26/25 Appointment Tanya Glisson, MD [facility]  Showing future appointments within next 90 days and meeting all other requirements   Disposition: Discharge home  Discharge (Date  Time): 01/14/2025; 0929 hrs.   Primary Care Physician: Sharma Coyer, MD Location: [facility] Note by: Glisson DELENA Tanya, MD (TTS technology used. I apologize  for any typographical errors that were not detected and corrected.) Date: 01/14/2025; Time: 10:22 AM  Disclaimer:  Medicine is not an visual merchandiser. The only guarantee in medicine is that nothing is guaranteed. It is important to note that the decision to proceed with this intervention was based on the information collected from the patient. The Data and conclusions were drawn from the patient's questionnaire, the interview, and the physical examination. Because the information was provided in large part by the patient, it cannot be guaranteed that it has not been purposely or unconsciously manipulated. Every effort has been made to obtain as much relevant data as possible for this evaluation. It is important to note that the conclusions that lead to this procedure are derived in large part from the  available data. Always take into account that the treatment will also be dependent on availability of resources and existing treatment guidelines, considered by other Pain Management Practitioners as being common knowledge and practice, at the time of the intervention. For Medico-Legal purposes, it is also important to point out that variation in procedural techniques and pharmacological choices are the acceptable norm. The indications, contraindications, technique, and results of the above procedure should only be interpreted and judged by a Board-Certified Interventional Pain Specialist with extensive familiarity and expertise in the same exact procedure and technique.

## 2025-01-14 NOTE — Progress Notes (Signed)
 Safety precautions to be maintained throughout the outpatient stay will include: orient to surroundings, keep bed in low position, maintain call bell within reach at all times, provide assistance with transfer out of bed and ambulation.

## 2025-01-19 ENCOUNTER — Encounter (INDEPENDENT_AMBULATORY_CARE_PROVIDER_SITE_OTHER): Admitting: Nurse Practitioner

## 2025-01-22 ENCOUNTER — Ambulatory Visit: Admitting: Emergency Medicine

## 2025-01-22 ENCOUNTER — Encounter: Payer: Self-pay | Admitting: Emergency Medicine

## 2025-01-22 VITALS — BP 138/68 | HR 70 | Ht 63.0 in | Wt 118.0 lb

## 2025-01-22 DIAGNOSIS — E7800 Pure hypercholesterolemia, unspecified: Secondary | ICD-10-CM

## 2025-01-22 DIAGNOSIS — R002 Palpitations: Secondary | ICD-10-CM

## 2025-01-22 DIAGNOSIS — R072 Precordial pain: Secondary | ICD-10-CM

## 2025-01-22 DIAGNOSIS — Z87898 Personal history of other specified conditions: Secondary | ICD-10-CM

## 2025-01-22 DIAGNOSIS — N1832 Chronic kidney disease, stage 3b: Secondary | ICD-10-CM

## 2025-01-22 DIAGNOSIS — I10 Essential (primary) hypertension: Secondary | ICD-10-CM

## 2025-01-22 NOTE — Patient Instructions (Addendum)
 Medication Instructions:  NO CHANGES  Lab Work: NONE TO BE DONE TODAY.  Testing/Procedures: NONE   Follow-Up: At [facility], you and your health needs are our priority.  As part of our continuing mission to provide you with exceptional heart care, our providers are all part of one team.  This team includes your primary Cardiologist (physician) and Advanced Practice Providers or APPs (Physician Assistants and Nurse Practitioners) who all work together to provide you with the care you need, when you need it.  Your next appointment:   3 MONTHS (TO DISCUSS THE REMOVAL OF HER LOOP RECORDER)   Provider:   Jerel Balding, MD     Other Instructions:

## 2025-01-22 NOTE — Progress Notes (Signed)
 " Cardiology Office Note:    Date:  01/22/2025  ID:  Julie Acevedo, DOB 1951-05-08, MRN [id number] PCP: Sharma Coyer, MD  [facility] Providers Cardiologist:  Jerel Balding, MD       Patient Profile:       Chief Complaint: Acute visit for chest pains History of Present Illness:  Julie Acevedo is a 74 y.o. female with visit-pertinent history of single syncopal event in April 2018, diet-controlled diabetes, CKD stage III, hypertension, occasional palpitations, hyperlipidemia  History of single syncopal event in April 2018 and underwent loop recorder insertion on 03/2018 with no reoccurrence and no arrhythmia recorded by implantable loop recorder during her 3-year follow-up, device and of service.  Patient was last seen in clinic on 01/20/2024 by Dr. Balding.  She described occasional episodes of chest pain in the retrosternal area rating down her left arm.  She notices these episodes at rest while she is watching TV but she notices that her heart rate is fast when these happen.  She underwent coronary CTA on 01/2024 showing coronary calcium  score of 0 and no evidence of CAD.   Discussed the use of AI scribe software for clinical note transcription with the patient, who gave verbal consent to proceed.  History of Present Illness Julie Acevedo is a 18 year who presents with ongoing chest pains.  She was referred by the hospital for follow-up regarding her loop recorder.  She has intermittent dull left-sided chest pain at the loop recorder site for about two years, with worsening intensity over the past year. Episodes last about two hours and occur every couple of days, including at rest, without a consistent exertional trigger. She believes the pain is related to the loop recorder.  She wonders if the pain could be musculoskeletal given her back problems.  She denies any dyspnea, orthopnea, PND, LEE.  Furthermore patient denies any palpitations or episodes of fast heart rates  today.  Per patient she has progressive dementia over the past two years, with increased agitation when in pain, which affects how she tolerates and reports her chest discomfort.   Review of systems:  Please see the history of present illness. All other systems are reviewed and otherwise negative.      Studies Reviewed:        Coronary CTA 02/10/2024 1. Coronary calcium  score of 0.   2. Normal coronary origin with right dominance.   3. No evidence of CAD.   4. CAD-RADS 0. Consider non-atherosclerotic causes of chest pain.  Echocardiogram 09/06/2021  1. Left ventricular ejection fraction, by estimation, is 55 to 60%. The  left ventricle has normal function. The left ventricle has no regional  wall motion abnormalities. Left ventricular diastolic parameters were  normal.   2. Right ventricular systolic function is normal. The right ventricular  size is normal.   3. The mitral valve is normal in structure. No evidence of mitral valve  regurgitation.   4. The aortic valve was not well visualized. Aortic valve regurgitation  is not visualized.   5. The inferior vena cava is normal in size with <50% respiratory  variability, suggesting right atrial pressure of 8 mmHg.  Risk Assessment/Calculations:              Physical Exam:   VS:  BP 138/68 (BP Location: Left Arm, Patient Position: Sitting, Cuff Size: Normal)   Pulse 70   Ht 5' 3 (1.6 m)   Wt 118 lb (53.5 kg)   BMI  20.90 kg/m    Wt Readings from Last 3 Encounters:  01/22/25 118 lb (53.5 kg)  01/14/25 123 lb (55.8 kg)  01/10/25 132 lb 3 oz (60 kg)    GEN: Well nourished, well developed in no acute distress NECK: No JVD; No carotid bruits CARDIAC: RRR, no murmurs, rubs, gallops RESPIRATORY:  Clear to auscultation without rales, wheezing or rhonchi  ABDOMEN: Soft, non-tender, non-distended EXTREMITIES:  No edema; No acute deformity      Assessment and Plan:  Chest pain Coronary CTA 01/2024 with coronary calcium   score of 0 and no evidence of CAD - Today patient presents with intermittent dull left-sided chest pain at the loop recorder site for about 2 years with worsening intensity over the past year.  Episodes last about 2 hours and occur every couple of days including at rest.  Reports she is very frustrated with the ongoing pain and would like the loop recorder removed - Her chest pain is atypical with no symptoms concerning for active angina.  On exam she is tender to palpation to the left side of the chest at loop recorder site.  There is no signs of infection or hematoma - She would like to have her loop recorder removed.  I will have her scheduled with Dr. Francyne to discuss this possibility - Features are strongly suggestive of musculoskeletal etiology  Syncope S/p syncopal event April 2018 thought to be due to hypotension S/p ILR in 2019 with no episodes found on monitor. ILR device no longer working/battery end of service - Today is without recurrent episodes of syncope.  Furthermore she denies any presyncope or episodes of hypotension - Stable.  Continue to monitor  Hypertension Blood pressure today is well-controlled at 138/60 - Continue hydrochlorothiazide  12.5 mg daily and metoprolol  succinate 25 mg daily  Palpitations Patient reports recent history of intermittent tachypalpitations that has resolved - We discussed ZIO monitoring today but patient politely deferred - Continue to monitor and consider resuming future if warranted  Hyperlipidemia LDL 47 on 10/2023 and well-controlled - Managed by PCP  CKD Creatinine 1.25 and GFR 46 on 12/2024 - Stable and well-controlled - Avoid nephrotoxic drugs      Dispo:  Return in about 3 months (around 04/21/2025).  Signed, Lum LITTIE Louis, NP  "

## 2025-01-26 ENCOUNTER — Ambulatory Visit: Admitting: Pain Medicine

## 2025-02-01 ENCOUNTER — Encounter (INDEPENDENT_AMBULATORY_CARE_PROVIDER_SITE_OTHER): Admitting: Nurse Practitioner

## 2025-05-28 ENCOUNTER — Ambulatory Visit: Admitting: Cardiovascular Disease
